# Patient Record
Sex: Female | Born: 1960 | Race: White | Hispanic: No | Smoking: Current every day smoker
Health system: Southern US, Community
[De-identification: ages and names within clinical notes are randomized; demographics above are authoritative.]

## PROBLEM LIST (undated history)

## (undated) DIAGNOSIS — F329 Major depressive disorder, single episode, unspecified: Secondary | ICD-10-CM

## (undated) DIAGNOSIS — Z8614 Personal history of Methicillin resistant Staphylococcus aureus infection: Secondary | ICD-10-CM

## (undated) DIAGNOSIS — M810 Age-related osteoporosis without current pathological fracture: Secondary | ICD-10-CM

## (undated) DIAGNOSIS — M545 Low back pain, unspecified: Secondary | ICD-10-CM

## (undated) DIAGNOSIS — Z91199 Patient's noncompliance with other medical treatment and regimen due to unspecified reason: Secondary | ICD-10-CM

## (undated) DIAGNOSIS — I639 Cerebral infarction, unspecified: Secondary | ICD-10-CM

## (undated) DIAGNOSIS — K219 Gastro-esophageal reflux disease without esophagitis: Secondary | ICD-10-CM

## (undated) DIAGNOSIS — R519 Headache, unspecified: Secondary | ICD-10-CM

## (undated) DIAGNOSIS — F419 Anxiety disorder, unspecified: Secondary | ICD-10-CM

## (undated) DIAGNOSIS — M1990 Unspecified osteoarthritis, unspecified site: Secondary | ICD-10-CM

## (undated) DIAGNOSIS — Z860100 Personal history of colon polyps, unspecified: Secondary | ICD-10-CM

## (undated) DIAGNOSIS — Z8719 Personal history of other diseases of the digestive system: Secondary | ICD-10-CM

## (undated) DIAGNOSIS — N189 Chronic kidney disease, unspecified: Secondary | ICD-10-CM

## (undated) DIAGNOSIS — J42 Unspecified chronic bronchitis: Secondary | ICD-10-CM

## (undated) DIAGNOSIS — J449 Chronic obstructive pulmonary disease, unspecified: Secondary | ICD-10-CM

## (undated) DIAGNOSIS — Z9289 Personal history of other medical treatment: Secondary | ICD-10-CM

## (undated) DIAGNOSIS — F4310 Post-traumatic stress disorder, unspecified: Secondary | ICD-10-CM

## (undated) DIAGNOSIS — F32A Depression, unspecified: Secondary | ICD-10-CM

## (undated) DIAGNOSIS — G2581 Restless legs syndrome: Secondary | ICD-10-CM

## (undated) DIAGNOSIS — G43909 Migraine, unspecified, not intractable, without status migrainosus: Secondary | ICD-10-CM

## (undated) DIAGNOSIS — I10 Essential (primary) hypertension: Secondary | ICD-10-CM

## (undated) DIAGNOSIS — E119 Type 2 diabetes mellitus without complications: Secondary | ICD-10-CM

## (undated) DIAGNOSIS — Z9119 Patient's noncompliance with other medical treatment and regimen: Secondary | ICD-10-CM

## (undated) DIAGNOSIS — R51 Headache: Secondary | ICD-10-CM

## (undated) DIAGNOSIS — K5792 Diverticulitis of intestine, part unspecified, without perforation or abscess without bleeding: Secondary | ICD-10-CM

## (undated) DIAGNOSIS — E785 Hyperlipidemia, unspecified: Secondary | ICD-10-CM

## (undated) DIAGNOSIS — Z8489 Family history of other specified conditions: Secondary | ICD-10-CM

## (undated) DIAGNOSIS — Z8711 Personal history of peptic ulcer disease: Secondary | ICD-10-CM

## (undated) DIAGNOSIS — G35 Multiple sclerosis: Secondary | ICD-10-CM

## (undated) DIAGNOSIS — R918 Other nonspecific abnormal finding of lung field: Secondary | ICD-10-CM

## (undated) DIAGNOSIS — R011 Cardiac murmur, unspecified: Secondary | ICD-10-CM

## (undated) DIAGNOSIS — G629 Polyneuropathy, unspecified: Secondary | ICD-10-CM

## (undated) DIAGNOSIS — G8929 Other chronic pain: Secondary | ICD-10-CM

## (undated) DIAGNOSIS — T7840XA Allergy, unspecified, initial encounter: Secondary | ICD-10-CM

## (undated) DIAGNOSIS — F40240 Claustrophobia: Secondary | ICD-10-CM

## (undated) DIAGNOSIS — D509 Iron deficiency anemia, unspecified: Secondary | ICD-10-CM

## (undated) DIAGNOSIS — D649 Anemia, unspecified: Secondary | ICD-10-CM

## (undated) DIAGNOSIS — J45909 Unspecified asthma, uncomplicated: Secondary | ICD-10-CM

## (undated) DIAGNOSIS — G4700 Insomnia, unspecified: Secondary | ICD-10-CM

## (undated) HISTORY — PX: COLONOSCOPY: SHX174

## (undated) HISTORY — PX: BREAST LUMPECTOMY: SHX2

## (undated) HISTORY — DX: Allergy, unspecified, initial encounter: T78.40XA

## (undated) HISTORY — PX: TUBAL LIGATION: SHX77

## (undated) HISTORY — DX: Major depressive disorder, single episode, unspecified: F32.9

## (undated) HISTORY — PX: BREAST EXCISIONAL BIOPSY: SUR124

## (undated) HISTORY — PX: ESOPHAGOGASTRODUODENOSCOPY: SHX1529

## (undated) HISTORY — PX: ANKLE SURGERY: SHX546

## (undated) HISTORY — DX: Depression, unspecified: F32.A

## (undated) HISTORY — DX: Age-related osteoporosis without current pathological fracture: M81.0

## (undated) HISTORY — PX: DILATION AND CURETTAGE OF UTERUS: SHX78

---
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
## 1987-06-08 HISTORY — PX: APPENDECTOMY: SHX54

## 1987-12-06 HISTORY — PX: ABDOMINAL HYSTERECTOMY: SHX81

## 1990-06-07 HISTORY — PX: TONSILLECTOMY: SUR1361

## 1994-06-07 HISTORY — PX: LAPAROSCOPIC CHOLECYSTECTOMY: SUR755

## 1996-06-07 HISTORY — PX: TUMOR EXCISION: SHX421

## 2003-06-08 HISTORY — PX: POSTERIOR LUMBAR FUSION: SHX6036

## 2011-11-22 ENCOUNTER — Observation Stay (HOSPITAL_COMMUNITY)
Admission: EM | Admit: 2011-11-22 | Discharge: 2011-11-24 | Disposition: A | Discharge status: Home / Self Care | Payer: Medicare Other | Attending: Internal Medicine | Admitting: Internal Medicine

## 2011-11-22 ENCOUNTER — Emergency Department (HOSPITAL_COMMUNITY): Payer: Medicare Other

## 2011-11-22 ENCOUNTER — Encounter (HOSPITAL_COMMUNITY): Payer: Self-pay | Admitting: *Deleted

## 2011-11-22 DIAGNOSIS — I10 Essential (primary) hypertension: Secondary | ICD-10-CM | POA: Diagnosis present

## 2011-11-22 DIAGNOSIS — Z8249 Family history of ischemic heart disease and other diseases of the circulatory system: Secondary | ICD-10-CM | POA: Insufficient documentation

## 2011-11-22 DIAGNOSIS — I252 Old myocardial infarction: Secondary | ICD-10-CM | POA: Insufficient documentation

## 2011-11-22 DIAGNOSIS — J4489 Other specified chronic obstructive pulmonary disease: Secondary | ICD-10-CM | POA: Insufficient documentation

## 2011-11-22 DIAGNOSIS — J449 Chronic obstructive pulmonary disease, unspecified: Secondary | ICD-10-CM | POA: Diagnosis present

## 2011-11-22 DIAGNOSIS — F17200 Nicotine dependence, unspecified, uncomplicated: Secondary | ICD-10-CM | POA: Insufficient documentation

## 2011-11-22 DIAGNOSIS — Z79899 Other long term (current) drug therapy: Secondary | ICD-10-CM | POA: Insufficient documentation

## 2011-11-22 DIAGNOSIS — E785 Hyperlipidemia, unspecified: Secondary | ICD-10-CM | POA: Insufficient documentation

## 2011-11-22 DIAGNOSIS — E119 Type 2 diabetes mellitus without complications: Secondary | ICD-10-CM | POA: Insufficient documentation

## 2011-11-22 DIAGNOSIS — I2510 Atherosclerotic heart disease of native coronary artery without angina pectoris: Secondary | ICD-10-CM | POA: Insufficient documentation

## 2011-11-22 DIAGNOSIS — R0789 Other chest pain: Principal | ICD-10-CM | POA: Insufficient documentation

## 2011-11-22 DIAGNOSIS — K219 Gastro-esophageal reflux disease without esophagitis: Secondary | ICD-10-CM | POA: Diagnosis present

## 2011-11-22 DIAGNOSIS — R079 Chest pain, unspecified: Secondary | ICD-10-CM

## 2011-11-22 HISTORY — DX: Other nonspecific abnormal finding of lung field: R91.8

## 2011-11-22 HISTORY — DX: Essential (primary) hypertension: I10

## 2011-11-22 HISTORY — DX: Hyperlipidemia, unspecified: E78.5

## 2011-11-22 HISTORY — DX: Chronic obstructive pulmonary disease, unspecified: J44.9

## 2011-11-22 LAB — CBC
MCV: 89 fL (ref 78.0–100.0)
Platelets: 273 10*3/uL (ref 150–400)
RBC: 4.62 MIL/uL (ref 3.87–5.11)
WBC: 7.7 10*3/uL (ref 4.0–10.5)

## 2011-11-22 LAB — POCT I-STAT TROPONIN I: Troponin i, poc: 0 ng/mL (ref 0.00–0.08)

## 2011-11-22 LAB — BASIC METABOLIC PANEL
CO2: 24 mEq/L (ref 19–32)
Chloride: 105 mEq/L (ref 96–112)
Creatinine, Ser: 0.8 mg/dL (ref 0.50–1.10)
Potassium: 3.6 mEq/L (ref 3.5–5.1)

## 2011-11-22 MED ORDER — NITROGLYCERIN 0.4 MG SL SUBL
0.4000 mg | SUBLINGUAL_TABLET | SUBLINGUAL | Status: DC | PRN
  Administered 2011-11-22: 0.4 mg via SUBLINGUAL

## 2011-11-22 MED ORDER — MORPHINE SULFATE 4 MG/ML IJ SOLN
4.0000 mg | Freq: Once | INTRAMUSCULAR | Status: AC
  Administered 2011-11-22: 4 mg via INTRAVENOUS
  Filled 2011-11-22: qty 1

## 2011-11-22 MED ORDER — ASPIRIN 81 MG PO CHEW
324.0000 mg | CHEWABLE_TABLET | Freq: Once | ORAL | Status: AC
  Administered 2011-11-22: 324 mg via ORAL
  Filled 2011-11-22: qty 4

## 2011-11-22 MED ORDER — ONDANSETRON HCL 4 MG/2ML IJ SOLN
4.0000 mg | Freq: Once | INTRAMUSCULAR | Status: AC
  Administered 2011-11-22: 4 mg via INTRAVENOUS
  Filled 2011-11-22: qty 2

## 2011-11-22 NOTE — ED Notes (Addendum)
Pt reports intermittent left chest pain radiating to left arm and neck - pt states pain started x3 days ago - admits to increased pain w/ inspiration. Denies any n/v. Skin warm and dry at present. Pt w/ hx of angina, MI and family hx of early MI in bother parents.

## 2011-11-22 NOTE — ED Provider Notes (Signed)
This 51 year old female has a strong family history of coronary artery disease or premature death, she herself has a history of myocardial infarction, her last catheterization was about 4 years ago showing moderate blockages according to the patient, she also has COPD, diabetes, hypertension, and still smokes cigarettes, she has been noncompliant with medications for the last couple years having been unable to find a primary care doctor when she lived in [place], she describes a few days of exertional anginal type symptoms associated with some shortness of breath and chest pressure lasting several minutes each and is pain-free now. She has no pleuritic pain or cough. She is not short of breath at rest. She's had no prolonged episodes of chest discomfort. Her initial ECG and troponin are unremarkable.  Medical screening examination/treatment/procedure(s) were conducted as a shared visit with non-physician practitioner(s) and myself.  I personally evaluated the patient during the encounter  Hurman Horn, MD 11/24/11 0021

## 2011-11-22 NOTE — ED Provider Notes (Signed)
History     CSN: [id number]  Arrival date & time 11/22/11  1945   First MD Initiated Contact with Patient 11/22/11 2139      Chief Complaint  Patient presents with  . Chest Pain    (Consider location/radiation/quality/duration/timing/severity/associated sxs/prior treatment) HPI  Patient has just recently moved to [facility] from [place] and does not have a PCP or cardiologist in the area. She states that she had an MI 6 years ago and a cardiac cath done 3 years ago in which blockage was shown in multiple vessels she reports up to 50%. She admits that she has not been taking medications to control her blood pressure. She states that her pain started 3 days ago and has been intermittent and sharp. It has been exacerbated my effort and relieves with rest. She currently is having intermittent chest pains that are sharp and substernal.   Past Medical History  Diagnosis Date  . Diabetes mellitus   . Hypertension   . Angina pectoris   . Hyperlipidemia   . COPD (chronic obstructive pulmonary disease)   . Reflux   . Myocardial infarction     No past surgical history on file.  No family history on file.  History  Substance Use Topics  . Smoking status: Current Everyday Smoker -- 0.5 packs/day  . Smokeless tobacco: Not on file  . Alcohol Use: No    OB History    Grav Para Term Preterm Abortions TAB SAB Ect Mult Living                  Review of Systems   HEENT: denies blurry vision or change in hearing PULMONARY: Denies difficulty breathing and SOB CARDIAC: denies heart palpitations MUSCULOSKELETAL:  denies being unable to ambulate ABDOMEN AL: denies abdominal pain GU: denies loss of bowel or urinary control NEURO: denies numbness and tingling in extremities SKIN: no new rashes PSYCH: patient behavior is normal NECK: Not complaining of neck pain     Allergies  Penicillins  Home Medications   Current Outpatient Rx  Name Route Sig Dispense Refill  . CARVEDILOL  6.25 MG PO TABS Oral Take 6.25 mg by mouth 2 (two) times daily with a meal.    . GABAPENTIN 400 MG PO CAPS Oral Take 400 mg by mouth 3 (three) times daily.    Marland Kitchen HYDROCHLOROTHIAZIDE 12.5 MG PO CAPS Oral Take 12.5 mg by mouth daily.    Marland Kitchen LISINOPRIL 40 MG PO TABS Oral Take 40 mg by mouth daily.    Marland Kitchen OMEPRAZOLE 20 MG PO CPDR Oral Take 20 mg by mouth daily.    Marland Kitchen ROPINIROLE HCL 2 MG PO TABS Oral Take 2 mg by mouth at bedtime.    Marland Kitchen ZOLPIDEM TARTRATE 10 MG PO TABS Oral Take 10 mg by mouth at bedtime as needed.      BP 172/83  Pulse 59  Temp 98.8 F (37.1 C) (Oral)  Resp 21  Ht 5\' 7"  (1.702 m)  Wt 182 lb (82.555 kg)  BMI 28.51 kg/m2  SpO2 100%  Physical Exam  Nursing note and vitals reviewed. Constitutional: She appears well-developed and well-nourished. No distress.  HENT:  Head: Normocephalic and atraumatic.  Eyes: Pupils are equal, round, and reactive to light.  Neck: Normal range of motion. Neck supple.  Cardiovascular: Normal rate and regular rhythm.   Pulmonary/Chest: Effort normal.  Abdominal: Soft.  Neurological: She is alert.  Skin: Skin is warm and dry.    ED Course  Procedures (  including critical care time)  Labs Reviewed  BASIC METABOLIC PANEL - Abnormal; Notable for the following:    GFR calc non Af Amer 85 (*)     All other components within normal limits  CBC  POCT I-STAT TROPONIN I   Dg Chest 2 View  11/22/2011  *RADIOLOGY REPORT*  Clinical Data: Left-sided chest pain.  Shortness of breath.  Cough. Asthma.  COPD.  CHEST - 2 VIEW  Comparison:  None.  Findings:  The heart size and mediastinal contours are within normal limits.  Both lungs are clear.  The visualized skeletal structures are unremarkable.  IMPRESSION: No active cardiopulmonary disease.  Original Report Authenticated By: Danae Orleans, M.D.     1. Chest pain       MDM  EKG and first Trop both normal   Date: 11/22/2011  Rate: 82  Rhythm: normal sinus rhythm  QRS Axis: normal  Intervals:  normal  ST/T Wave abnormalities: normal  Conduction Disutrbances:none  Narrative Interpretation:   Old EKG Reviewed: none available  Patients pain was momentarily relieved with Nitro. It, however did start to come back. Pt was given morphine and she has been symptom free since then. Dr. Fonnie Jarvis has evaluated patient and agrees with my plan for cardiac rule out.  [facility]- Dr. Cena Benton, Team 5, Telemetry       Zaydan Papesh Irine Seal, [place] 11/22/11 2330  Dorthula Matas, PA 11/22/11 [phone]

## 2011-11-23 ENCOUNTER — Encounter (HOSPITAL_COMMUNITY): Payer: Self-pay | Admitting: *Deleted

## 2011-11-23 DIAGNOSIS — I10 Essential (primary) hypertension: Secondary | ICD-10-CM | POA: Diagnosis present

## 2011-11-23 DIAGNOSIS — J438 Other emphysema: Secondary | ICD-10-CM

## 2011-11-23 DIAGNOSIS — R0789 Other chest pain: Secondary | ICD-10-CM | POA: Diagnosis present

## 2011-11-23 DIAGNOSIS — I517 Cardiomegaly: Secondary | ICD-10-CM

## 2011-11-23 DIAGNOSIS — K219 Gastro-esophageal reflux disease without esophagitis: Secondary | ICD-10-CM

## 2011-11-23 DIAGNOSIS — J449 Chronic obstructive pulmonary disease, unspecified: Secondary | ICD-10-CM | POA: Diagnosis present

## 2011-11-23 LAB — HEMOGLOBIN A1C: Mean Plasma Glucose: 117 mg/dL — ABNORMAL HIGH (ref <117)

## 2011-11-23 LAB — CBC
MCH: 30.3 pg (ref 26.0–34.0)
MCHC: 34 g/dL (ref 30.0–36.0)
MCV: 89.2 fL (ref 78.0–100.0)
Platelets: 239 10*3/uL (ref 150–400)
RBC: 4.65 MIL/uL (ref 3.87–5.11)

## 2011-11-23 LAB — CREATININE, SERUM
Creatinine, Ser: 0.76 mg/dL (ref 0.50–1.10)
GFR calc Af Amer: >90 mL/min
GFR calc non Af Amer: >90 mL/min

## 2011-11-23 LAB — LIPID PANEL
Cholesterol: 160 mg/dL (ref 0–200)
HDL: 34 mg/dL — ABNORMAL LOW (ref >39)
Total CHOL/HDL Ratio: 4.7 RATIO
Triglycerides: 139 mg/dL (ref <150)

## 2011-11-23 LAB — GLUCOSE, CAPILLARY: Glucose-Capillary: 112 mg/dL — ABNORMAL HIGH (ref 70–99)

## 2011-11-23 LAB — BASIC METABOLIC PANEL WITH GFR
BUN: 17 mg/dL (ref 6–23)
CO2: 20 meq/L (ref 19–32)
Calcium: 9.1 mg/dL (ref 8.4–10.5)
Chloride: 103 meq/L (ref 96–112)
Creatinine, Ser: 0.77 mg/dL (ref 0.50–1.10)
GFR calc Af Amer: >90 mL/min
GFR calc non Af Amer: >90 mL/min
Glucose, Bld: 104 mg/dL — ABNORMAL HIGH (ref 70–99)
Potassium: 3.7 meq/L (ref 3.5–5.1)
Sodium: 138 meq/L (ref 135–145)

## 2011-11-23 LAB — CARDIAC PANEL(CRET KIN+CKTOT+MB+TROPI)
CK, MB: 1.6 ng/mL (ref 0.3–4.0)
CK, MB: 1.8 ng/mL (ref 0.3–4.0)
Relative Index: INVALID (ref 0.0–2.5)
Relative Index: INVALID (ref 0.0–2.5)
Total CK: 50 U/L (ref 7–177)
Total CK: 47 U/L (ref 7–177)
Troponin I: <0.3 ng/mL
Troponin I: <0.3 ng/mL

## 2011-11-23 LAB — MAGNESIUM: Magnesium: 2.1 mg/dL (ref 1.5–2.5)

## 2011-11-23 LAB — PROTIME-INR: Prothrombin Time: 14.1 seconds (ref 11.6–15.2)

## 2011-11-23 LAB — TSH: TSH: 4.028 u[IU]/mL (ref 0.350–4.500)

## 2011-11-23 MED ORDER — LISINOPRIL 40 MG PO TABS
40.0000 mg | ORAL_TABLET | Freq: Every day | ORAL | Status: DC
  Administered 2011-11-23: 40 mg via ORAL
  Filled 2011-11-23 (×2): qty 1

## 2011-11-23 MED ORDER — HEPARIN SODIUM (PORCINE) 5000 UNIT/ML IJ SOLN
5000.0000 [IU] | Freq: Three times a day (TID) | INTRAMUSCULAR | Status: DC
  Administered 2011-11-23 – 2011-11-24 (×5): 5000 [IU] via SUBCUTANEOUS
  Filled 2011-11-23 (×8): qty 1

## 2011-11-23 MED ORDER — SODIUM CHLORIDE 0.9 % IV SOLN: 250.0000 mL | INTRAVENOUS | Status: DC | PRN

## 2011-11-23 MED ORDER — ONDANSETRON HCL 4 MG/2ML IJ SOLN: 4.0000 mg | Freq: Four times a day (QID) | INTRAMUSCULAR | Status: DC | PRN

## 2011-11-23 MED ORDER — SODIUM CHLORIDE 0.9 % IJ SOLN: 3.0000 mL | INTRAMUSCULAR | Status: DC | PRN

## 2011-11-23 MED ORDER — CARVEDILOL 6.25 MG PO TABS
6.2500 mg | ORAL_TABLET | Freq: Two times a day (BID) | ORAL | Status: DC
  Administered 2011-11-23 – 2011-11-24 (×3): 6.25 mg via ORAL
  Filled 2011-11-23 (×5): qty 1

## 2011-11-23 MED ORDER — PANTOPRAZOLE SODIUM 40 MG IV SOLR
40.0000 mg | INTRAVENOUS | Status: DC
  Administered 2011-11-23 – 2011-11-24 (×2): 40 mg via INTRAVENOUS
  Filled 2011-11-23 (×3): qty 40

## 2011-11-23 MED ORDER — NITROGLYCERIN 0.4 MG SL SUBL: 0.4000 mg | SUBLINGUAL_TABLET | SUBLINGUAL | Status: DC | PRN

## 2011-11-23 MED ORDER — ASPIRIN EC 81 MG PO TBEC
81.0000 mg | DELAYED_RELEASE_TABLET | Freq: Every day | ORAL | Status: DC
  Filled 2011-11-23: qty 1

## 2011-11-23 MED ORDER — HYDROCODONE-ACETAMINOPHEN 5-325 MG PO TABS
1.0000 | ORAL_TABLET | ORAL | Status: DC | PRN
  Administered 2011-11-23: 1 via ORAL
  Filled 2011-11-23: qty 1

## 2011-11-23 MED ORDER — SODIUM CHLORIDE 0.9 % IJ SOLN
3.0000 mL | Freq: Two times a day (BID) | INTRAMUSCULAR | Status: DC
  Administered 2011-11-23 – 2011-11-24 (×2): 3 mL via INTRAVENOUS

## 2011-11-23 MED ORDER — ZOLPIDEM TARTRATE 10 MG PO TABS
10.0000 mg | ORAL_TABLET | Freq: Every evening | ORAL | Status: DC | PRN
  Administered 2011-11-23: 10 mg via ORAL
  Filled 2011-11-23: qty 1

## 2011-11-23 MED ORDER — NICOTINE 14 MG/24HR TD PT24
14.0000 mg | MEDICATED_PATCH | Freq: Every day | TRANSDERMAL | Status: DC
  Administered 2011-11-23 – 2011-11-24 (×2): 14 mg via TRANSDERMAL
  Filled 2011-11-23 (×2): qty 1

## 2011-11-23 MED ORDER — ATORVASTATIN CALCIUM 20 MG PO TABS
20.0000 mg | ORAL_TABLET | Freq: Every day | ORAL | Status: DC
  Administered 2011-11-23 – 2011-11-24 (×2): 20 mg via ORAL
  Filled 2011-11-23 (×2): qty 1

## 2011-11-23 MED ORDER — INSULIN ASPART 100 UNIT/ML ~~LOC~~ SOLN
0.0000 [IU] | Freq: Three times a day (TID) | SUBCUTANEOUS | Status: DC
  Administered 2011-11-24: 1 [IU] via SUBCUTANEOUS

## 2011-11-23 MED ORDER — ALBUTEROL SULFATE (5 MG/ML) 0.5% IN NEBU: 2.5000 mg | INHALATION_SOLUTION | Freq: Four times a day (QID) | RESPIRATORY_TRACT | Status: DC | PRN

## 2011-11-23 MED ORDER — ACETAMINOPHEN 325 MG PO TABS
650.0000 mg | ORAL_TABLET | ORAL | Status: DC | PRN
  Administered 2011-11-23: 650 mg via ORAL
  Filled 2011-11-23: qty 2

## 2011-11-23 MED ORDER — ROPINIROLE HCL 1 MG PO TABS
2.0000 mg | ORAL_TABLET | Freq: Every day | ORAL | Status: DC
  Administered 2011-11-23: 2 mg via ORAL
  Filled 2011-11-23 (×2): qty 2

## 2011-11-23 MED ORDER — HYDROCHLOROTHIAZIDE 12.5 MG PO CAPS
12.5000 mg | ORAL_CAPSULE | Freq: Every day | ORAL | Status: DC
  Administered 2011-11-23: 12.5 mg via ORAL
  Filled 2011-11-23 (×2): qty 1

## 2011-11-23 MED ORDER — GABAPENTIN 400 MG PO CAPS
400.0000 mg | ORAL_CAPSULE | Freq: Three times a day (TID) | ORAL | Status: DC
  Administered 2011-11-23 – 2011-11-24 (×4): 400 mg via ORAL
  Filled 2011-11-23 (×6): qty 1

## 2011-11-23 NOTE — Progress Notes (Signed)
[facility] PROGRESS NOTE  Katrina [name] NWG:[id number] DOB: 17-Aug-1960 DOA: 11/22/2011 PCP: No primary provider on file.  Assessment/Plan: Chest discomfort   ACS  Ruled out. - Cycle cardiac enzymes negative. EKG shows no ischemic changes. Tele monitor unremarkable.  Lipids 96 of LDL. - Echocardiogram pending - continue aspirin  Cardiology consult called for possible stress testing.  - Seems atypical but given history of diabetes and possibility of atypical presentation will plan on ruling out.  Active Problems:  COPD (chronic obstructive pulmonary disease)  - Stable currently will add albuterol prn  DM  - hemoglobin A1c is 5.7 - Place on diabetic diet  GERD (gastroesophageal reflux disease)  - Will add protonix  HTN (hypertension)  - Will plan on continuing home regimen  Code Status: full code   Katrina Towry, MD  [facility] Pager [phone]  If 7PM-7AM, please contact night-coverage [email or web address] Password [email or web address] 11/23/2011, 2:01 PM   LOS: 1 day   Brief narrative: Patient is a 51 y/o CF with PMH of DM (currently off medication), long standing htn, copd, and reflux. Is presenting to the ED complaining of chest discomfort for the past 3 days   Consultants:  CARDIOLOGY  Procedures:  ECHO PENDING.  Antibiotics:  NONE  HPI/Subjective: INTERMITTENT CHEST PAIN.  Objective: Filed Vitals:   11/22/11 2300 11/23/11 0256 11/23/11 0430 11/23/11 0800  BP: 172/83 161/73 163/93 163/93  Pulse: 59 76 61 61  Temp:  98.7 F (37.1 C) 97.6 F (36.4 C) 97.6 F (36.4 C)  TempSrc:  Oral Oral Oral  Resp: 21 18 18 18   Height:   5\' 7"  (1.702 m)   Weight:   85.004 kg (187 lb 6.4 oz)   SpO2: 100% 100% 99% 100%    Intake/Output Summary (Last 24 hours) at 11/23/11 1401 Last data filed at 11/23/11 0500  Gross per 24 hour  Intake    240 ml  Output      0 ml  Net    240 ml    Exam:   General:  Alert afebrile comfortable  Cardiovascular: s1s2 heard  RRR  Respiratory: CTAB no wheezing or rhonchi  Abdomen: soft NT ND BS+  Data Reviewed: Basic Metabolic Panel:  Lab 11/23/11 2952 11/22/11 2023  NA 138 138  K 3.7 3.6  CL 103 105  CO2 20 24  GLUCOSE 104* 89  BUN 17 16  CREATININE 0.760.77 0.80  CALCIUM 9.1 9.0  MG 2.1 --  PHOS -- --   Liver Function Tests: No results found for this basename: AST:5,ALT:5,ALKPHOS:5,BILITOT:5,PROT:5,ALBUMIN:5 in the last 168 hours No results found for this basename: LIPASE:5,AMYLASE:5 in the last 168 hours No results found for this basename: AMMONIA:5 in the last 168 hours CBC:  Lab 11/23/11 0235 11/22/11 2023  WBC 9.8 7.7  NEUTROABS -- --  HGB 14.1 14.0  HCT 41.5 41.1  MCV 89.2 89.0  PLT 239 273   Cardiac Enzymes:  Lab 11/23/11 0810 11/23/11 0236  CKTOTAL 50 49  CKMB 1.6 1.4  CKMBINDEX -- --  TROPONINI <0.30 <0.30   BNP (last 3 results) No results found for this basename: PROBNP:3 in the last 8760 hours CBG: No results found for this basename: GLUCAP:5 in the last 168 hours  No results found for this or any previous visit (from the past 240 hour(s)).   Studies: Dg Chest 2 View  11/22/2011  *RADIOLOGY REPORT*  Clinical Data: Left-sided chest pain.  Shortness of breath.  Cough. Asthma.  COPD.  CHEST - 2 VIEW  Comparison:  None.  Findings:  The heart size and mediastinal contours are within normal limits.  Both lungs are clear.  The visualized skeletal structures are unremarkable.  IMPRESSION: No active cardiopulmonary disease.  Original Report Authenticated By: Danae Orleans, M.D.    Scheduled Meds:   . aspirin  324 mg Oral Once  . aspirin EC  81 mg Oral Daily  . atorvastatin  20 mg Oral q1800  . carvedilol  6.25 mg Oral BID WC  . gabapentin  400 mg Oral TID  . heparin  5,000 Units Subcutaneous Q8H  . hydrochlorothiazide  12.5 mg Oral Daily  . lisinopril  40 mg Oral Daily  .  morphine injection  4 mg Intravenous Once  . nicotine  14 mg Transdermal Daily  . ondansetron  4  mg Intravenous Once  . pantoprazole (PROTONIX) IV  40 mg Intravenous Q24H  . rOPINIRole  2 mg Oral QHS  . sodium chloride  3 mL Intravenous Q12H   Continuous Infusions:

## 2011-11-23 NOTE — Consult Note (Signed)
HPI: 51 year old female for evaluation of chest pain. Patient states she had a myocardial infarction in [place] 6 years ago. She apparently had 2 cardiac catheterizations but those records are not available. She apparently had nonobstructive disease and did not require intervention. She resided in [place] for a while and recently moved here 2 weeks ago. She presented with complaints of chest pain and has ruled out for myocardial infarction. Cardiology was therefore asked to evaluate. The patient apparently has had intermittent chest pain for 6-7 years. Over the past 3 days it has worsened. She has had continuous pain in the left upper chest and axilla. It is described as a sharp pain followed by a pressure. There is associated diaphoresis, shortness of breath and nausea. It increases with certain movements, lying flat, inspiration and exertion. Cardiology is therefore asked to evaluate.  Medications Prior to Admission  Medication Sig Dispense Refill  . carvedilol (COREG) 6.25 MG tablet Take 6.25 mg by mouth 2 (two) times daily with a meal.      . gabapentin (NEURONTIN) 400 MG capsule Take 400 mg by mouth 3 (three) times daily.      . hydrochlorothiazide (MICROZIDE) 12.5 MG capsule Take 12.5 mg by mouth daily.      Marland Kitchen lisinopril (PRINIVIL,ZESTRIL) 40 MG tablet Take 40 mg by mouth daily.      Marland Kitchen omeprazole (PRILOSEC) 20 MG capsule Take 20 mg by mouth daily.      Marland Kitchen rOPINIRole (REQUIP) 2 MG tablet Take 2 mg by mouth at bedtime.      Marland Kitchen zolpidem (AMBIEN) 10 MG tablet Take 10 mg by mouth at bedtime as needed.        Allergies  Allergen Reactions  . Penicillins Anaphylaxis    Past Medical History  Diagnosis Date  . Diabetes mellitus   . Hypertension   . Hyperlipidemia   . COPD (chronic obstructive pulmonary disease)   . Reflux   . Myocardial infarction   . CAD (coronary artery disease)   . Hypothyroid   . Lung nodules   . Diverticulosis   . Bipolar affective     Past Surgical  History  Procedure Date  . Back surgery   . Abdominal hysterectomy   . Appendectomy   . Tonsillectomy   . Ankle surgery   . Cholecystectomy   . Nose surgery     History   Social History  . Marital Status: Single    Spouse Name: N/A    Number of Children: N/A  . Years of Education: N/A   Occupational History  .      Disability for back pain   Social History Main Topics  . Smoking status: Current Everyday Smoker -- 0.5 packs/day  . Smokeless tobacco: Not on file  . Alcohol Use: No  . Drug Use: No  . Sexually Active:    Other Topics Concern  . Not on file   Social History Narrative  . No narrative on file    Family History  Problem Relation Age of Onset  . Coronary artery disease Father     ROS:  Chronic back pain but no fevers or chills, productive cough, hemoptysis, dysphasia, odynophagia, melena, hematochezia, dysuria, hematuria, rash, seizure activity, orthopnea, PND, pedal edema, claudication. Remaining systems are negative.  Physical Exam:   Blood pressure 130/89, pulse 70, temperature 98.2 F (36.8 C), temperature source Oral, resp. rate 20, height 5\' 7"  (1.702 m), weight 85.004 kg (187 lb 6.4 oz), SpO2 97.00%.  General:  Well developed/well nourished in NAD Skin warm/dry Patient not depressed No peripheral clubbing Back-normal HEENT-normal/normal eyelids Neck supple/normal carotid upstroke bilaterally; no bruits; no JVD; no thyromegaly chest - CTA/ normal expansion CV - RRR/normal S1 and S2; no murmurs, rubs or gallops;  PMI nondisplaced Abdomen -NT/ND, no HSM, no mass, + bowel sounds, no bruit 2+ femoral pulses, no bruits Ext-no edema, chords, 2+ DP Neuro-grossly nonfocal  ECG normal sinus rhythm with no ST changes.  Results for orders placed during the hospital encounter of 11/22/11 (from the past 48 hour(s))  CBC     Status: Normal   Collection Time   11/22/11  8:23 PM      Component Value Range Comment   WBC 7.7  4.0 - 10.5 K/uL    RBC  4.62  3.87 - 5.11 MIL/uL    Hemoglobin 14.0  12.0 - 15.0 g/dL    HCT 16.1  09.6 - 04.5 %    MCV 89.0  78.0 - 100.0 fL    MCH 30.3  26.0 - 34.0 pg    MCHC 34.1  30.0 - 36.0 g/dL    RDW 40.9  81.1 - 91.4 %    Platelets 273  150 - 400 K/uL   BASIC METABOLIC PANEL     Status: Abnormal   Collection Time   11/22/11  8:23 PM      Component Value Range Comment   Sodium 138  135 - 145 mEq/L    Potassium 3.6  3.5 - 5.1 mEq/L    Chloride 105  96 - 112 mEq/L    CO2 24  19 - 32 mEq/L    Glucose, Bld 89  70 - 99 mg/dL    BUN 16  6 - 23 mg/dL    Creatinine, Ser 7.82  0.50 - 1.10 mg/dL    Calcium 9.0  8.4 - 95.6 mg/dL    GFR calc non Af Amer 85 (*) >90 mL/min    GFR calc Af Amer >90  >90 mL/min   POCT I-STAT TROPONIN I     Status: Normal   Collection Time   11/22/11  8:32 PM      Component Value Range Comment   Troponin i, poc 0.00  0.00 - 0.08 ng/mL    Comment 3            PROTIME-INR     Status: Normal   Collection Time   11/23/11  2:35 AM      Component Value Range Comment   Prothrombin Time 14.1  11.6 - 15.2 seconds    INR 1.07  0.00 - 1.49   TSH     Status: Normal   Collection Time   11/23/11  2:35 AM      Component Value Range Comment   TSH 4.028  0.350 - 4.500 uIU/mL   MAGNESIUM     Status: Normal   Collection Time   11/23/11  2:35 AM      Component Value Range Comment   Magnesium 2.1  1.5 - 2.5 mg/dL   LIPID PANEL     Status: Abnormal   Collection Time   11/23/11  2:35 AM      Component Value Range Comment   Cholesterol 160  0 - 200 mg/dL    Triglycerides 213  <086 mg/dL    HDL 34 (*) >57 mg/dL    Total CHOL/HDL Ratio 4.7      VLDL 28  0 - 40 mg/dL    LDL Cholesterol  98  0 - 99 mg/dL   HEMOGLOBIN J4N     Status: Abnormal   Collection Time   11/23/11  2:35 AM      Component Value Range Comment   Hemoglobin A1C 5.7 (*) <5.7 %    Mean Plasma Glucose 117 (*) <117 mg/dL   CBC     Status: Normal   Collection Time   11/23/11  2:35 AM      Component Value Range Comment   WBC  9.8  4.0 - 10.5 K/uL    RBC 4.65  3.87 - 5.11 MIL/uL    Hemoglobin 14.1  12.0 - 15.0 g/dL    HCT 82.9  56.2 - 13.0 %    MCV 89.2  78.0 - 100.0 fL    MCH 30.3  26.0 - 34.0 pg    MCHC 34.0  30.0 - 36.0 g/dL    RDW 86.5  78.4 - 69.6 %    Platelets 239  150 - 400 K/uL   CREATININE, SERUM     Status: Normal   Collection Time   11/23/11  2:35 AM      Component Value Range Comment   Creatinine, Ser 0.76  0.50 - 1.10 mg/dL    GFR calc non Af Amer >90  >90 mL/min    GFR calc Af Amer >90  >90 mL/min   BASIC METABOLIC PANEL     Status: Abnormal   Collection Time   11/23/11  2:35 AM      Component Value Range Comment   Sodium 138  135 - 145 mEq/L    Potassium 3.7  3.5 - 5.1 mEq/L    Chloride 103  96 - 112 mEq/L    CO2 20  19 - 32 mEq/L    Glucose, Bld 104 (*) 70 - 99 mg/dL    BUN 17  6 - 23 mg/dL    Creatinine, Ser 2.95  0.50 - 1.10 mg/dL    Calcium 9.1  8.4 - 28.4 mg/dL    GFR calc non Af Amer >90  >90 mL/min    GFR calc Af Amer >90  >90 mL/min   CARDIAC PANEL(CRET KIN+CKTOT+MB+TROPI)     Status: Normal   Collection Time   11/23/11  2:36 AM      Component Value Range Comment   Total CK 49  7 - 177 U/L    CK, MB 1.4  0.3 - 4.0 ng/mL    Troponin I <0.30  <0.30 ng/mL    Relative Index RELATIVE INDEX IS INVALID  0.0 - 2.5   CARDIAC PANEL(CRET KIN+CKTOT+MB+TROPI)     Status: Normal   Collection Time   11/23/11  8:10 AM      Component Value Range Comment   Total CK 50  7 - 177 U/L    CK, MB 1.6  0.3 - 4.0 ng/mL    Troponin I <0.30  <0.30 ng/mL    Relative Index RELATIVE INDEX IS INVALID  0.0 - 2.5   CARDIAC PANEL(CRET KIN+CKTOT+MB+TROPI)     Status: Normal   Collection Time   11/23/11  1:48 PM      Component Value Range Comment   Total CK 47  7 - 177 U/L    CK, MB 1.8  0.3 - 4.0 ng/mL    Troponin I <0.30  <0.30 ng/mL    Relative Index RELATIVE INDEX IS INVALID  0.0 - 2.5     Dg Chest 2 View  11/22/2011  *RADIOLOGY REPORT*  Clinical Data: Left-sided chest pain.  Shortness of breath.   Cough. Asthma.  COPD.  CHEST - 2 VIEW  Comparison:  None.  Findings:  The heart size and mediastinal contours are within normal limits.  Both lungs are clear.  The visualized skeletal structures are unremarkable.  IMPRESSION: No active cardiopulmonary disease.  Original Report Authenticated By: Danae Orleans, M.D.    Assessment/Plan #1-chest pain-symptoms are extremely atypical. They have been continuous for the past 3 days. Doubt cardiac etiology. Enzymes are negative. Will arrange Myoview for risk stratification. If negative no plans for further cardiac evaluation. Would obtain records concerning previous catheterization. #2-CAD-patient describes nonobstructive disease on 2 previous catheterizations in [place]. Obtain records. Continue aspirin and statin. #3-hypertension-increase medications as needed. #4-diabetes mellitus-management per primary care. #5-history of pulmonary nodules by report-her primary care will need outside records and she will need followup for this issue. #6-tobacco abuse-patient counseled on discontinuing. #7-hyperlipidemia-continue statin. #8-COPD  Note patient has multiple medical problems and close followup with a primary care physician. If she has a normal functional study I do not think she will require cardiology followup. Discussed at length risk factor modification.  Olga Millers MD 11/23/2011, 2:43 PM

## 2011-11-23 NOTE — H&P (Signed)
PCP:  Patient recently moved here from [facility]  Chief Complaint:  Chest discomfort  HPI: Patient is a 51 y/o CF with PMH of DM (currently off medication), long standing htn, copd, and reflux.  Is presenting to the ED complaining of chest discomfort for the past 3 days.  Mentions that the pain is a stabbing sensation at times.  She feels like the pain has gone up into her left shoulder and radiated down her left arm.  Does not know what makes it better or worse.  Denies any recent fevers, chills, productive cough, nausea, emesis, or diaphoresis.  Patient reportedly has not been taking her medication for DM but she does states that she has been taking her Blood pressure medication as well as her medication for reflux of which she takes prilosec and zantac sometimes.    In the ED patient had a chest x ray which showed no cardiopulmonary disease, CBC and BMP which were WNL's, and i stat troponin level of 0.00.  Patient was given aspirin 324 mg po x 1, morphine, zofran, and nitroglycerin which she mentions did not really help her chest discomfort.  Allergies:   Allergies  Allergen Reactions  . Penicillins Anaphylaxis      Past Medical History  Diagnosis Date  . Diabetes mellitus   . Hypertension   . Angina pectoris   . Hyperlipidemia   . COPD (chronic obstructive pulmonary disease)   . Reflux   . Myocardial infarction     No past surgical history on file.  Prior to Admission medications   Medication Sig Start Date End Date Taking? Authorizing Provider  carvedilol (COREG) 6.25 MG tablet Take 6.25 mg by mouth 2 (two) times daily with a meal.   Yes Historical Provider, MD  gabapentin (NEURONTIN) 400 MG capsule Take 400 mg by mouth 3 (three) times daily.   Yes Historical Provider, MD  hydrochlorothiazide (MICROZIDE) 12.5 MG capsule Take 12.5 mg by mouth daily.   Yes Historical Provider, MD  lisinopril (PRINIVIL,ZESTRIL) 40 MG tablet Take 40 mg by mouth daily.   Yes Historical Provider, MD    omeprazole (PRILOSEC) 20 MG capsule Take 20 mg by mouth daily.   Yes Historical Provider, MD  rOPINIRole (REQUIP) 2 MG tablet Take 2 mg by mouth at bedtime.   Yes Historical Provider, MD  zolpidem (AMBIEN) 10 MG tablet Take 10 mg by mouth at bedtime as needed.   Yes Historical Provider, MD    Social History:  reports that she has been smoking.  She does not have any smokeless tobacco history on file. She reports that she does not drink alcohol or use illicit drugs.  No family history on file.  Review of Systems:  Constitutional: Denies fever, chills, diaphoresis, appetite change and fatigue.  HEENT: Denies photophobia, eye pain, redness, hearing loss, ear pain, congestion, sore throat, rhinorrhea, sneezing, mouth sores, trouble swallowing, neck pain, neck stiffness and tinnitus.   Respiratory: Denies SOB, DOE, + cough (nonproductive), denies chest tightness,  and wheezing.   Cardiovascular: +chest pain, denies palpitations and leg swelling.  Gastrointestinal: + nausea, denies vomiting, abdominal pain, diarrhea, constipation, blood in stool and abdominal distention.  Genitourinary: Denies dysuria, urgency, frequency, hematuria, flank pain and difficulty urinating.  Musculoskeletal: Denies myalgias, back pain, joint swelling, arthralgias and gait problem.  Skin: Denies pallor, rash and wound.  Neurological: Denies dizziness, seizures, syncope, weakness, light-headedness, numbness and headaches.  Hematological: Denies adenopathy. Easy bruising, personal or family bleeding history  Psychiatric/Behavioral: Denies suicidal ideation, mood  changes, confusion, nervousness, sleep disturbance and agitation   Physical Exam: Blood pressure 172/83, pulse 59, temperature 98.8 F (37.1 C), temperature source Oral, resp. rate 21, height 5\' 7"  (1.702 m), weight 82.555 kg (182 lb), SpO2 100.00%. General: Alert, awake, oriented x3, in no acute distress. HEENT: Atraumatic normocephalic, EOMI, PERRLA No  bruits, no goiter. Heart: Regular rate and rhythm, without murmurs, rubs, gallops. Some discomfort on palpation over left chest. Lungs: Clear to auscultation bilaterally. No wheezes. Pt speaks in full sentences Abdomen: Soft, nontender, nondistended, positive bowel sounds. Extremities: No clubbing cyanosis or edema with positive pedal pulses. Neuro: Grossly intact, nonfocal.  Labs on Admission:  Results for orders placed during the hospital encounter of 11/22/11 (from the past 48 hour(s))  CBC     Status: Normal   Collection Time   11/22/11  8:23 PM      Component Value Range Comment   WBC 7.7  4.0 - 10.5 K/uL    RBC 4.62  3.87 - 5.11 MIL/uL    Hemoglobin 14.0  12.0 - 15.0 g/dL    HCT 40.9  81.1 - 91.4 %    MCV 89.0  78.0 - 100.0 fL    MCH 30.3  26.0 - 34.0 pg    MCHC 34.1  30.0 - 36.0 g/dL    RDW 78.2  95.6 - 21.3 %    Platelets 273  150 - 400 K/uL   BASIC METABOLIC PANEL     Status: Abnormal   Collection Time   11/22/11  8:23 PM      Component Value Range Comment   Sodium 138  135 - 145 mEq/L    Potassium 3.6  3.5 - 5.1 mEq/L    Chloride 105  96 - 112 mEq/L    CO2 24  19 - 32 mEq/L    Glucose, Bld 89  70 - 99 mg/dL    BUN 16  6 - 23 mg/dL    Creatinine, Ser 0.86  0.50 - 1.10 mg/dL    Calcium 9.0  8.4 - 57.8 mg/dL    GFR calc non Af Amer 85 (*) >90 mL/min    GFR calc Af Amer >90  >90 mL/min   POCT I-STAT TROPONIN I     Status: Normal   Collection Time   11/22/11  8:32 PM      Component Value Range Comment   Troponin i, poc 0.00  0.00 - 0.08 ng/mL    Comment 3              Radiological Exams on Admission: Dg Chest 2 View  11/22/2011  *RADIOLOGY REPORT*  Clinical Data: Left-sided chest pain.  Shortness of breath.  Cough. Asthma.  COPD.  CHEST - 2 VIEW  Comparison:  None.  Findings:  The heart size and mediastinal contours are within normal limits.  Both lungs are clear.  The visualized skeletal structures are unremarkable.  IMPRESSION: No active cardiopulmonary disease.   Original Report Authenticated By: Danae Orleans, M.D.    Assessment/Plan Principal Problem:  *Chest discomfort - r/o ACS - admit to telemetry - Cycle cardiac enzymes - Echocardiogram - continue aspirin - Seems atypical but given history of diabetes and possibility of atypical presentation will plan on ruling out.  Active Problems:  COPD (chronic obstructive pulmonary disease) - Stable currently will add albuterol prn  DM - obtain hemoglobin A1c - blood glucose on BMP WNL's - Place on diabetic diet  GERD (gastroesophageal reflux disease) - Will add protonix  HTN (hypertension) - Will plan on continuing home regimen. Currently not well controlled and may be secondary to discomfort.  Patient mentioned that she was in the past taking medication for hypothyroidism but has not been doing so for years.  Will go ahead and order TSH at this juncture.   Time Spent on Admission: > 55 minutes obtaining history from patient, PE, updating information systems, billing, medical decision making, placing orders.  Penny Pia [facility] Pager: [phone] 11/23/2011, 12:48 AM

## 2011-11-23 NOTE — Progress Notes (Signed)
*  PRELIMINARY RESULTS* Echocardiogram 2D Echocardiogram has been performed.  Katrina [name] 11/23/2011, 4:06 PM

## 2011-11-23 NOTE — ED Notes (Signed)
MD at bedside. 

## 2011-11-23 NOTE — Progress Notes (Signed)
   CARE MANAGEMENT NOTE 11/23/2011  Patient:  Katrina [name], Katrina [name]   Account Number:  [id number]  Date Initiated:  11/23/2011  Documentation initiated by:  Jiles Crocker  Subjective/Objective Assessment:   ADMITTED TO OBSERVATION WITH CHEST PAIN     Action/Plan:   INDEPENDENT PRIOR TO ADMISSION;RECENTLY MOVED HERE FROM [place]   Anticipated DC Date:  11/24/2011   Anticipated DC Plan:  HOME/SELF CARE      DC Planning Services  CM consult               Status of service:  In process, will continue to follow Medicare Important Message given?  NA - LOS <3 / Initial given by admissions (If response is "NO", the following Medicare IM given date fields will be blank)  Per UR Regulation:  Reviewed for med. necessity/level of care/duration of stay  Comments:  11/23/2011- B Shameeka Silliman RN, BSN, MHA

## 2011-11-24 ENCOUNTER — Observation Stay (HOSPITAL_COMMUNITY): Payer: Medicare Other

## 2011-11-24 DIAGNOSIS — K219 Gastro-esophageal reflux disease without esophagitis: Secondary | ICD-10-CM

## 2011-11-24 DIAGNOSIS — R079 Chest pain, unspecified: Secondary | ICD-10-CM

## 2011-11-24 DIAGNOSIS — R0789 Other chest pain: Secondary | ICD-10-CM

## 2011-11-24 DIAGNOSIS — J438 Other emphysema: Secondary | ICD-10-CM

## 2011-11-24 DIAGNOSIS — I10 Essential (primary) hypertension: Secondary | ICD-10-CM

## 2011-11-24 LAB — GLUCOSE, CAPILLARY
Glucose-Capillary: 92 mg/dL (ref 70–99)
Glucose-Capillary: 127 mg/dL — ABNORMAL HIGH (ref 70–99)

## 2011-11-24 MED ORDER — TECHNETIUM TC 99M TETROFOSMIN IV KIT
10.0000 | PACK | Freq: Once | INTRAVENOUS | Status: AC | PRN
  Administered 2011-11-24: 10 via INTRAVENOUS

## 2011-11-24 MED ORDER — REGADENOSON 0.4 MG/5ML IV SOLN
INTRAVENOUS | Status: AC | PRN
  Administered 2011-11-24: 0.4 mg via INTRAVENOUS

## 2011-11-24 MED ORDER — TECHNETIUM TC 99M TETROFOSMIN IV KIT
30.0000 | PACK | Freq: Once | INTRAVENOUS | Status: AC | PRN
  Administered 2011-11-24: 30 via INTRAVENOUS

## 2011-11-24 MED ORDER — OMEPRAZOLE 20 MG PO CPDR: 40.0000 mg | DELAYED_RELEASE_CAPSULE | Freq: Two times a day (BID) | ORAL | Status: DC

## 2011-11-24 NOTE — ED Notes (Signed)
Nuclear Medicine Cardiac Stress Test with Lexiscan started at 1247.  Eugenie Norrie, PA at bedside for start and throughout exam.  Pt tolerated well with out adverse effects.

## 2011-11-24 NOTE — ED Notes (Signed)
Eugenie Norrie, PA at bedside for start and throughout exam.

## 2011-11-24 NOTE — Discharge Summary (Signed)
Katrina [name]

## 2011-11-24 NOTE — ED Notes (Signed)
Exam completed without adverse effects. Pt tolerated well, complain of mild headache pre and post exam.

## 2011-11-24 NOTE — Progress Notes (Signed)
    SUBJECTIVE: Some left sided chest pain overnight. No SOB  BP 125/84  Pulse 72  Temp 98 F (36.7 C) (Oral)  Resp 18  Ht 5\' 7"  (1.702 m)  Wt 183 lb 1.6 oz (83.054 kg)  BMI 28.68 kg/m2  SpO2 95%  Intake/Output Summary (Last 24 hours) at 11/24/11 0645 Last data filed at 11/24/11 0515  Gross per 24 hour  Intake   1020 ml  Output   1950 ml  Net   -930 ml    PHYSICAL EXAM General: Well developed, well nourished, in no acute distress. Alert and oriented x 3.  Psych:  Good affect, responds appropriately Neck: No JVD. No masses noted.  Lungs: Clear bilaterally with no wheezes or rhonci noted.  Heart: RRR with no murmurs noted. Abdomen: Bowel sounds are present. Soft, non-tender.  Extremities: No lower extremity edema.   LABS: Basic Metabolic Panel:  Basename 11/23/11 0235 11/22/11 2023  NA 138 138  K 3.7 3.6  CL 103 105  CO2 20 24  GLUCOSE 104* 89  BUN 17 16  CREATININE 0.760.77 0.80  CALCIUM 9.1 9.0  MG 2.1 --  PHOS -- --   CBC:  Basename 11/23/11 0235 11/22/11 2023  WBC 9.8 7.7  NEUTROABS -- --  HGB 14.1 14.0  HCT 41.5 41.1  MCV 89.2 89.0  PLT 239 273   Cardiac Enzymes:  Basename 11/23/11 1348 11/23/11 0810 11/23/11 0236  CKTOTAL 47 50 49  CKMB 1.8 1.6 1.4  CKMBINDEX -- -- --  TROPONINI <0.30 <0.30 <0.30   Fasting Lipid Panel:  Basename 11/23/11 0235  CHOL 160  HDL 34*  LDLCALC 98  TRIG 409  CHOLHDL 4.7  LDLDIRECT --    Current Meds:    . aspirin EC  81 mg Oral Daily  . atorvastatin  20 mg Oral q1800  . carvedilol  6.25 mg Oral BID WC  . gabapentin  400 mg Oral TID  . heparin  5,000 Units Subcutaneous Q8H  . hydrochlorothiazide  12.5 mg Oral Daily  . insulin aspart  0-9 Units Subcutaneous TID WC  . lisinopril  40 mg Oral Daily  . nicotine  14 mg Transdermal Daily  . pantoprazole (PROTONIX) IV  40 mg Intravenous Q24H  . rOPINIRole  2 mg Oral QHS  . sodium chloride  3 mL Intravenous Q12H   Echo 11/23/11:  Left ventricle: The cavity  size was normal. Wall thickness was increased in a pattern of mild LVH. Systolic function was normal. The estimated ejection fraction was in the range of 60% to 65%. Wall motion was normal; there were no regional wall motion abnormalities. Left ventricular diastolic function parameters were normal.    ASSESSMENT AND PLAN:  1. Chest pain: Negative cardiac markers. Pt with history of CAD admitted with atypical chest pains. LV function normal on echo 11/23/11. Plans for stress myoview today. If negative, can be discharged home and f/u with Dr. Olga Millers in our [facility] office.   Katrina [name]  6/19/20136:45 AM

## 2011-11-24 NOTE — H&P (Signed)
Physician Discharge Summary  Patient ID: Katrina [name] MRN: [id number] DOB/AGE: 1961/05/26 51 y.o.  Admit date: 11/22/2011 Discharge date: 11/24/2011  Primary Care Physician:  No primary provider on file.   Discharge Diagnoses:   .Chest discomfort .COPD (chronic obstructive pulmonary disease) .GERD (gastroesophageal reflux disease) .HTN (hypertension)  Medication List  As of 11/24/2011  7:02 PM   TAKE these medications         carvedilol 6.25 MG tablet   Commonly known as: COREG   Take 6.25 mg by mouth 2 (two) times daily with a meal.      gabapentin 400 MG capsule   Commonly known as: NEURONTIN   Take 400 mg by mouth 3 (three) times daily.      hydrochlorothiazide 12.5 MG capsule   Commonly known as: MICROZIDE   Take 12.5 mg by mouth daily.      lisinopril 40 MG tablet   Commonly known as: PRINIVIL,ZESTRIL   Take 40 mg by mouth daily.      omeprazole 20 MG capsule   Commonly known as: PRILOSEC   Take 2 capsules (40 mg total) by mouth 2 (two) times daily.      rOPINIRole 2 MG tablet   Commonly known as: REQUIP   Take 2 mg by mouth at bedtime.      zolpidem 10 MG tablet   Commonly known as: AMBIEN   Take 10 mg by mouth at bedtime as needed.             Disposition and Follow-up:  Patient discharge in stable and improved condition; no having any CP, SOB or acute complaints. Chest discomfort appears to be 2/2 GERD. She has been advised to increased PPI to BID, to follow lifestyle changes modifications as instructed and to establish PCP care in the area. Following cardiology recommendations; she will follow in 2 weeks with Dr. Jens Som for cardiology follow up.  Consults:   Cardiology   Significant Diagnostic Studies:  Dg Chest 2 View  11/22/2011  *RADIOLOGY REPORT*  Clinical Data: Left-sided chest pain.  Shortness of breath.  Cough. Asthma.  COPD.  CHEST - 2 VIEW  Comparison:  None.  Findings:  The heart size and mediastinal contours are within normal limits.   Both lungs are clear.  The visualized skeletal structures are unremarkable.  IMPRESSION: No active cardiopulmonary disease.  Original Report Authenticated By: Danae Orleans, M.D.    Brief H and P: For complete details please refer to admission H and P, but in brief  Patient is a 51 y/o CF with PMH of DM (currently off medication), long standing htn, copd, and reflux. Is presenting to the ED complaining of chest discomfort for the past 3 days. Mentions that the pain is a stabbing sensation at times. She feels like the pain has gone up into her left shoulder and radiated down her left arm. Does not know what makes it better or worse. Denies any recent fevers, chills, productive cough, nausea, emesis, or diaphoresis. Patient reportedly has not been taking her medication for DM but she does states that she has been taking her Blood pressure medication as well as her medication for reflux of which she takes prilosec and     Hospital Course:  1-Chest discomfort: negative cardiac enzymes; myoview and also 2-D echo. Patient advised to quit smoking; to follow heart healthy diet and to start PPI BID. Follow up with cardiology as an outpatient as per directions on Dr. Gibson Ramp note.  2-COPD (chronic obstructive pulmonary  disease): no wheezing; advice to quit smoking.  3-GERD (gastroesophageal reflux disease)patient PPI change to BID and dose increased to 40mg  instead of 20. Lifestyle chamges explained.  4-HTN (hypertension): stable and well controlled. Patient advised to follow a low sodium diet.  5-DM: currently diet control; A1C 5.7   Time spent on Discharge: 40 minutes  Signed: Saintclair Schroader 11/24/2011, 7:02 PM

## 2011-11-24 NOTE — Progress Notes (Signed)
Patient immediately expressed being uncomfortable with proceeding with stress Myoview. She doesn't think she'll be able to complete test due to back. Will instead change to [facility], consent was changed. She tolerated test well with mild SOB, HA. Await interpretation of images. Castor Gittleman PA-C

## 2011-11-25 LAB — GLUCOSE, CAPILLARY: Glucose-Capillary: 165 mg/dL — ABNORMAL HIGH (ref 70–99)

## 2011-11-30 NOTE — Discharge Summary (Signed)
Patient ID:  Katrina [name]  MRN: [id number]  DOB/AGE: 10-01-1960 51 y.o.  Admit date: 11/22/2011  Discharge date: 11/24/2011  Primary Care Physician: No primary provider on file.  Discharge Diagnoses:  .Chest discomfort .COPD (chronic obstructive pulmonary disease) .GERD (gastroesophageal reflux disease) .HTN (hypertension)  Medication List  As of 11/24/2011 7:02 PM    TAKE these medications          carvedilol 6.25 MG tablet      Commonly known as: COREG      Take 6.25 mg by mouth 2 (two) times daily with a meal.      gabapentin 400 MG capsule      Commonly known as: NEURONTIN      Take 400 mg by mouth 3 (three) times daily.      hydrochlorothiazide 12.5 MG capsule      Commonly known as: MICROZIDE      Take 12.5 mg by mouth daily.      lisinopril 40 MG tablet      Commonly known as: PRINIVIL,ZESTRIL      Take 40 mg by mouth daily.      omeprazole 20 MG capsule      Commonly known as: PRILOSEC      Take 2 capsules (40 mg total) by mouth 2 (two) times daily.      rOPINIRole 2 MG tablet      Commonly known as: REQUIP      Take 2 mg by mouth at bedtime.      zolpidem 10 MG tablet      Commonly known as: AMBIEN      Take 10 mg by mouth at bedtime as needed.         Disposition and Follow-up:  Patient discharge in stable and improved condition; no having any CP, SOB or acute complaints. Chest discomfort appears to be 2/2 GERD. She has been advised to increased PPI to BID, to follow lifestyle changes modifications as instructed and to establish PCP care in the area. Following cardiology recommendations; she will follow in 2 weeks with Dr. Jens Som for cardiology follow up.  Consults:  Cardiology  Significant Diagnostic Studies:  Dg Chest 2 View  11/22/2011 *RADIOLOGY REPORT* Clinical Data: Left-sided chest pain. Shortness of breath. Cough. Asthma. COPD. CHEST - 2 VIEW Comparison: None. Findings: The heart size and mediastinal contours are within normal limits. Both lungs are clear.  The visualized skeletal structures are unremarkable. IMPRESSION: No active cardiopulmonary disease. Original Report Authenticated By: Danae Orleans, M.D.  Brief H and P:  For complete details please refer to admission H and P, but in brief  Patient is a 51 y/o CF with PMH of DM (currently off medication), long standing htn, copd, and reflux. Is presenting to the ED complaining of chest discomfort for the past 3 days. Mentions that the pain is a stabbing sensation at times. She feels like the pain has gone up into her left shoulder and radiated down her left arm. Does not know what makes it better or worse. Denies any recent fevers, chills, productive cough, nausea, emesis, or diaphoresis. Patient reportedly has not been taking her medication for DM but she does states that she has been taking her Blood pressure medication as well as her medication for reflux of which she takes prilosec and  Hospital Course:  1-Chest discomfort: negative cardiac enzymes; myoview and also 2-D echo. Patient advised to quit smoking; to follow heart healthy diet and to start PPI BID. Follow up with  cardiology as an outpatient as per directions on Dr. Gibson Ramp note.  2-COPD (chronic obstructive pulmonary disease): no wheezing; advice to quit smoking.  3-GERD (gastroesophageal reflux disease)patient PPI change to BID and dose increased to 40mg  instead of 20. Lifestyle chamges explained.  4-HTN (hypertension): stable and well controlled. Patient advised to follow a low sodium diet.  5-DM: currently diet control; A1C 5.7  Time spent on Discharge:  40 minutes  Signed:  Tiann Saha  11/24/2011, 7:02 PM

## 2012-01-31 ENCOUNTER — Other Ambulatory Visit: Payer: Self-pay | Admitting: Family Medicine

## 2012-01-31 DIAGNOSIS — N6320 Unspecified lump in the left breast, unspecified quadrant: Secondary | ICD-10-CM

## 2012-01-31 DIAGNOSIS — R928 Other abnormal and inconclusive findings on diagnostic imaging of breast: Secondary | ICD-10-CM

## 2012-02-09 ENCOUNTER — Other Ambulatory Visit: Payer: Medicare Other

## 2012-02-11 ENCOUNTER — Other Ambulatory Visit: Payer: Self-pay | Admitting: Allergy and Immunology

## 2012-02-11 ENCOUNTER — Ambulatory Visit
Admission: RE | Admit: 2012-02-11 | Discharge: 2012-02-11 | Disposition: A | Discharge status: Home / Self Care | Payer: Medicare Other | Source: Ambulatory Visit | Attending: Allergy and Immunology | Admitting: Allergy and Immunology

## 2012-02-11 DIAGNOSIS — R0602 Shortness of breath: Secondary | ICD-10-CM

## 2012-02-18 ENCOUNTER — Ambulatory Visit
Admission: RE | Admit: 2012-02-18 | Discharge: 2012-02-18 | Disposition: A | Discharge status: Home / Self Care | Payer: Medicare Other | Source: Ambulatory Visit | Attending: Family Medicine | Admitting: Family Medicine

## 2012-02-18 DIAGNOSIS — R928 Other abnormal and inconclusive findings on diagnostic imaging of breast: Secondary | ICD-10-CM

## 2012-02-18 DIAGNOSIS — N6320 Unspecified lump in the left breast, unspecified quadrant: Secondary | ICD-10-CM

## 2012-03-03 ENCOUNTER — Ambulatory Visit (INDEPENDENT_AMBULATORY_CARE_PROVIDER_SITE_OTHER): Payer: Medicare Other | Admitting: Internal Medicine

## 2012-03-03 ENCOUNTER — Encounter: Payer: Self-pay | Admitting: Internal Medicine

## 2012-03-03 VITALS — BP 190/120 | HR 89 | Temp 98.2°F | Ht 66.0 in | Wt 190.8 lb

## 2012-03-03 DIAGNOSIS — R0600 Dyspnea, unspecified: Secondary | ICD-10-CM

## 2012-03-03 DIAGNOSIS — R0989 Other specified symptoms and signs involving the circulatory and respiratory systems: Secondary | ICD-10-CM

## 2012-03-03 DIAGNOSIS — F17200 Nicotine dependence, unspecified, uncomplicated: Secondary | ICD-10-CM

## 2012-03-03 DIAGNOSIS — Z720 Tobacco use: Secondary | ICD-10-CM

## 2012-03-03 DIAGNOSIS — R0609 Other forms of dyspnea: Secondary | ICD-10-CM

## 2012-03-03 DIAGNOSIS — R079 Chest pain, unspecified: Secondary | ICD-10-CM

## 2012-03-03 NOTE — Progress Notes (Signed)
Subjective:    Patient ID: Katrina [name], female    DOB: 1961-02-21, 51 y.o.   MRN: [id number]  HPI  PCP is Elizabeth Palau, FNP - [facility].  No cardiologist.  Body mass index is 30.80 kg/(m^2).  reports that she has been smoking Cigarettes.  She has a 40 pack-year smoking history. She does not have any smokeless tobacco history on file.   IOV 03/03/2012 51 year old disabled due to back surgery.  Known CAD and s/p MI.  She is not sure if she is diabetic but says sugars runs 200s periodically. She is originally from [place] and [place]. Moved to [facility] in 2011 and then to  [facility] in 2013. She is not a great historian  Cc: Dyspnea. Insidious onset x few years though diagnosed with copd in 1990s. Progressive past year. Rates it as severe. Always exertional. Also brought on by perfumes. Always improved by rest. Occasional present even at rest. Reports hx of  PFT in 2010 in [place] and reportedly told "you are 2 points away from o2" and diagnosed with copd. There is asspciated chest pain with exertional dyspnea that also improved with rest. She says this is gettting worse too. She says this is angina but does not have cardiologist in [place]. There is also associated weight gain of 50#, moderate cough (note on coreg and ace inhibitor), and wheezing. COPD CAT score is 30 and reflect severe symptom burden.  Symptoms also associaed with freuent AECOPD hx - 3 antibiotic courses in 2013 and 2 prednisone course in 2012 all as outpatient, Curently not on any MDI Rx  Smoker - 40 pack smoker. Continues to smoke. Tried e-cigs in past but did not work. NEver tried chantix due to bipolar disorder. Failed zyban in 1990s. Not sure if she wants to quit. Emotionally unable to quit.  Tried some laser Rx in past to help quit smoking but this failed too.   CAD - says MI diagnosd first in [place] in 2000 and 2nd at [facility] in [place] in 2008. Had unctrollable bp per hx. Apparently blockages were not severe enough to do  stent    CAT COPD Symptom & Quality of Life Score (GSK trademark) 0 is no burden. 5 is highest burden 03/03/2012   Never Cough -> Cough all the time 4  No phlegm in chest -> Chest is full of phlegm 3  No chest tightness -> Chest feels very tight 4  No dyspnea for 1 flight stairs/hill -> Very dyspneic for 1 flight of stairs 5  No limitations for ADL at home -> Very limited with ADL at home 3  Confident leaving home -> Not at all confident leaving home 5  Sleep soundly -> Do not sleep soundly because of lung condition 3  Lots of Energy -> No energy at all 3  TOTAL Score (max 40)  30       Past Medical History  Diagnosis Date  . Diabetes mellitus   . Hypertension   . Hyperlipidemia   . COPD (chronic obstructive pulmonary disease)   . Reflux   . Myocardial infarction   . CAD (coronary artery disease)   . Hypothyroid   . Lung nodules   . Diverticulosis   . Bipolar affective      Family History  Problem Relation Age of Onset  . Coronary artery disease Father   . Emphysema Father      History   Social History  . Marital Status: Single  Spouse Name: N/A    Number of Children: N/A  . Years of Education: N/A   Occupational History  .      Disability for back pain   Social History Main Topics  . Smoking status: Current Every Day Smoker -- 1.0 packs/day for 40 years    Types: Cigarettes  . Smokeless tobacco: Not on file  . Alcohol Use: No  . Drug Use: No  . Sexually Active: Not on file   Other Topics Concern  . Not on file   Social History Narrative  . No narrative on file     Allergies  Allergen Reactions  . Penicillins Anaphylaxis     Outpatient Prescriptions Prior to Visit  Medication Sig Dispense Refill  . gabapentin (NEURONTIN) 400 MG capsule Take 400 mg by mouth 3 (three) times daily.      . hydrochlorothiazide (MICROZIDE) 12.5 MG capsule Take 12.5 mg by mouth daily.      Marland Kitchen lisinopril (PRINIVIL,ZESTRIL) 40 MG tablet Take 40 mg by mouth daily.       Marland Kitchen omeprazole (PRILOSEC) 20 MG capsule Take 2 capsules (40 mg total) by mouth 2 (two) times daily.  60 capsule  1  . rOPINIRole (REQUIP) 2 MG tablet Take 2 mg by mouth at bedtime.      Marland Kitchen zolpidem (AMBIEN) 10 MG tablet Take 10 mg by mouth at bedtime as needed.      . carvedilol (COREG) 6.25 MG tablet Take 6.25 mg by mouth 2 (two) times daily with a meal.          Review of Systems  Constitutional: Negative for fever and unexpected weight change.  HENT: Positive for congestion, rhinorrhea and sneezing. Negative for ear pain, nosebleeds, sore throat, trouble swallowing, dental problem, postnasal drip and sinus pressure.   Eyes: Negative for redness and itching.  Respiratory: Positive for cough and shortness of breath. Negative for chest tightness and wheezing.   Cardiovascular: Positive for chest pain. Negative for palpitations and leg swelling.  Gastrointestinal: Negative for nausea and vomiting.  Genitourinary: Negative for dysuria.  Musculoskeletal: Negative for joint swelling.  Skin: Negative for rash.  Neurological: Negative for headaches.  Hematological: Does not bruise/bleed easily.  Psychiatric/Behavioral: Negative for dysphoric mood. The patient is not nervous/anxious.        Objective:   Physical Exam  Vitals reviewed. Constitutional: She is oriented to person, place, and time. She appears well-developed and well-nourished. No distress.  HENT:  Head: Normocephalic and atraumatic.  Right Ear: External ear normal.  Left Ear: External ear normal.  Mouth/Throat: Oropharynx is clear and moist. No oropharyngeal exudate.  Eyes: Conjunctivae normal and EOM are normal. Pupils are equal, round, and reactive to light. Right eye exhibits no discharge. Left eye exhibits no discharge. No scleral icterus.  Neck: Normal range of motion. Neck supple. No JVD present. No tracheal deviation present. No thyromegaly present.  Cardiovascular: Normal rate, regular rhythm, normal heart sounds and  intact distal pulses.  Exam reveals no gallop and no friction rub.   No murmur heard. Pulmonary/Chest: Effort normal and breath sounds normal. No respiratory distress. She has no wheezes. She has no rales. She exhibits no tenderness.  Abdominal: Soft. Bowel sounds are normal. She exhibits no distension and no mass. There is no tenderness. There is no rebound and no guarding.  Musculoskeletal: Normal range of motion. She exhibits no edema and no tenderness.  Lymphadenopathy:    She has no cervical adenopathy.  Neurological: She is alert and  oriented to person, place, and time. She has normal reflexes. No cranial nerve deficit. She exhibits normal muscle tone. Coordination normal.  Skin: Skin is warm and dry. No rash noted. She is not diaphoretic. No erythema. No pallor.  Psychiatric: She has a normal mood and affect. Her behavior is normal. Judgment and thought content normal.          Assessment & Plan:

## 2012-03-03 NOTE — Patient Instructions (Addendum)
#  Shortness of breath  - we will walk you for oyxgen levels now  - have breathing test called PFT  - after that return to see me or my NP Tammy - glad you had flu shot and pneumovax  #Chest pain  - refer [facility]  #Smoking  - we discussed quitting and its importance  #Followup After pft with Tammy the NP or myself

## 2012-03-05 ENCOUNTER — Encounter: Payer: Self-pay | Admitting: Internal Medicine

## 2012-03-05 DIAGNOSIS — R0600 Dyspnea, unspecified: Secondary | ICD-10-CM | POA: Insufficient documentation

## 2012-03-05 DIAGNOSIS — R0609 Other forms of dyspnea: Secondary | ICD-10-CM | POA: Insufficient documentation

## 2012-03-05 DIAGNOSIS — Z720 Tobacco use: Secondary | ICD-10-CM | POA: Insufficient documentation

## 2012-03-05 NOTE — Assessment & Plan Note (Signed)
She seems to be having angina along with exertional dyspnea. Will refer her to [facility] cardiology

## 2012-03-05 NOTE — Assessment & Plan Note (Signed)
SHe is not interested in quitting but acknowledges the importance of quitting

## 2012-03-05 NOTE — Assessment & Plan Note (Signed)
COPD most likely dominant component but angina equvialent possible. Obesity and decondiitoning could be playing a role  PLAN PFT and then followup with NP or myself

## 2012-03-09 ENCOUNTER — Ambulatory Visit (INDEPENDENT_AMBULATORY_CARE_PROVIDER_SITE_OTHER): Payer: Medicare Other | Admitting: Adult Health

## 2012-03-09 ENCOUNTER — Encounter: Payer: Self-pay | Admitting: Adult Health

## 2012-03-09 ENCOUNTER — Ambulatory Visit (INDEPENDENT_AMBULATORY_CARE_PROVIDER_SITE_OTHER): Payer: Medicare Other | Admitting: Internal Medicine

## 2012-03-09 VITALS — BP 136/84 | HR 77 | Temp 98.2°F | Ht 67.0 in | Wt 189.0 lb

## 2012-03-09 DIAGNOSIS — J449 Chronic obstructive pulmonary disease, unspecified: Secondary | ICD-10-CM

## 2012-03-09 DIAGNOSIS — R0600 Dyspnea, unspecified: Secondary | ICD-10-CM

## 2012-03-09 DIAGNOSIS — R0989 Other specified symptoms and signs involving the circulatory and respiratory systems: Secondary | ICD-10-CM

## 2012-03-09 DIAGNOSIS — R079 Chest pain, unspecified: Secondary | ICD-10-CM

## 2012-03-09 DIAGNOSIS — R0609 Other forms of dyspnea: Secondary | ICD-10-CM

## 2012-03-09 LAB — PULMONARY FUNCTION TEST

## 2012-03-09 NOTE — Patient Instructions (Addendum)
Most Important goal is to quit smoking  Follow up with cardiology tomorrow as planned and As needed   Discuss with your family doctor regarding your blood pressure medicine -Lisinopril as it may be causing you to cough/wheeze.  Your breathing test does not show COPD as of yet , so it is very important to quit smoking now.  follow up Dr. Marchelle Gearing in 4-6 months and As needed

## 2012-03-09 NOTE — Progress Notes (Signed)
PFT done today. 

## 2012-03-09 NOTE — Assessment & Plan Note (Addendum)
Preserved lung fxn on PFT, small reversibility in mid flows but suspect most of her symptoms  Are multifactoral with ongoing smoking. ACE could be contributing to cough and frequent flares Previoulsy on Coreg which could have been causing some symptoms as well.  Workup thus far w/ neg ambulatory desats, and CXR  DLCO is diminished.  Deconditioning and weight gain are contributing factors as well  Plan  She should keep cards follow up to r/o cardiac source to dyspnea.  Consider changing ACE inhibitor in future if cough/wheezing /freq flares are an issues.  Advised on smoking cessation.  No inhalers for now.  If remains symptoms once off ACE , consider QVAR  For possible asthma component.

## 2012-03-10 ENCOUNTER — Encounter: Payer: Self-pay | Admitting: Cardiovascular Disease

## 2012-03-10 ENCOUNTER — Ambulatory Visit (INDEPENDENT_AMBULATORY_CARE_PROVIDER_SITE_OTHER): Payer: Medicare Other | Admitting: Cardiovascular Disease

## 2012-03-10 VITALS — BP 138/82 | HR 81 | Resp 18 | Ht 67.0 in | Wt 188.0 lb

## 2012-03-10 DIAGNOSIS — I10 Essential (primary) hypertension: Secondary | ICD-10-CM

## 2012-03-10 DIAGNOSIS — I2510 Atherosclerotic heart disease of native coronary artery without angina pectoris: Secondary | ICD-10-CM

## 2012-03-10 DIAGNOSIS — R079 Chest pain, unspecified: Secondary | ICD-10-CM

## 2012-03-10 MED ORDER — LOSARTAN POTASSIUM 100 MG PO TABS: 100.0000 mg | ORAL_TABLET | Freq: Every day | ORAL | Status: DC

## 2012-03-10 NOTE — Progress Notes (Signed)
Subjective:    Patient ID: Katrina [name], female    DOB: 1961-02-23, 51 y.o.   MRN: [id number]  HPI   IOV 03/03/2012 51 year old disabled due to back surgery.  Known CAD and s/p MI.  She is not sure if she is diabetic but says sugars runs 200s periodically. She is originally from [place] and [place]. Moved to [facility] in 2011 and then to  [facility] in 2013. She is not a great historian  Cc: Dyspnea. Insidious onset x few years though diagnosed with copd in 1990s. Progressive past year. Rates it as severe. Always exertional. Also brought on by perfumes. Always improved by rest. Occasional present even at rest. Reports hx of  PFT in 2010 in [place] and reportedly told "you are 2 points away from o2" and diagnosed with copd. There is asspciated chest pain with exertional dyspnea that also improved with rest. She says this is gettting worse too. She says this is angina but does not have cardiologist in [place]. There is also associated weight gain of 50#, moderate cough (note on coreg and ace inhibitor), and wheezing. COPD CAT score is 30 and reflect severe symptom burden.  Symptoms also associaed with freuent AECOPD hx - 3 antibiotic courses in 2013 and 2 prednisone course in 2012 all as outpatient, Curently not on any MDI Rx  Smoker - 40 pack smoker. Continues to smoke. Tried e-cigs in past but did not work. NEver tried chantix due to bipolar disorder. Failed zyban in 1990s. Not sure if she wants to quit. Emotionally unable to quit.  Tried some laser Rx in past to help quit smoking but this failed too.   CAD - says MI diagnosd first in [place] in 2000 and 2nd at [facility] in [place], [place] in 2008. Had unctrollable bp per hx. Apparently blockages were not severe enough to do stent  CAT score 30   03/09/2012 Follow up and PFT  Pt returns for 1 week follow up. She has been having DOE .  Was referred last ov to cards to r/o card source as she has several risk factors . Has ov with cards tomorrow.  Today PFT shows : FEV1 2.30L  (83%) , no sign change s/p BD Ratio 79, Mid flows w/ some reversibility. DLCO diminshed at 53% No walking desats .  cxr NAD (02/2012)  We discussed smoking cessation- " I have no interest in stopping smoking , that will never happen- If I lose a lung to cancer that just happens-my dad made it ..."  We discussed deconditioning, exercise. " I am not interested in exercising"  No hemotpysis , chest pain , edema or weight loss. +weight gain over last few years, up >50lbs.  She has had several Bronchitic flare requiring steroids and abx over last years .  Has a daily cough at times.  Of note she is on ACE Inhibitor .  Previously on coreg but this was stopped by PCP recently per Pt.        Past Medical History  Diagnosis Date  . Diabetes mellitus   . Hypertension   . Hyperlipidemia   . COPD (chronic obstructive pulmonary disease)   . Reflux   . Myocardial infarction   . CAD (coronary artery disease)   . Hypothyroid   . Lung nodules   . Diverticulosis   . Bipolar affective      Family History  Problem Relation Age of Onset  . Coronary artery disease Father   . Emphysema Father  History   Social History  . Marital Status: Single    Spouse Name: N/A    Number of Children: N/A  . Years of Education: N/A   Occupational History  .      Disability for back pain   Social History Main Topics  . Smoking status: Current Every Day Smoker -- 1.0 packs/day for 40 years    Types: Cigarettes  . Smokeless tobacco: Not on file  . Alcohol Use: No  . Drug Use: No  . Sexually Active: Not on file   Other Topics Concern  . Not on file   Social History Narrative  . No narrative on file     Allergies  Allergen Reactions  . Penicillins Anaphylaxis     Outpatient Prescriptions Prior to Visit  Medication Sig Dispense Refill  . gabapentin (NEURONTIN) 400 MG capsule Take 400 mg by mouth 3 (three) times daily.      . hydrochlorothiazide (MICROZIDE) 12.5 MG capsule Take 12.5 mg  by mouth daily.      Marland Kitchen lisinopril (PRINIVIL,ZESTRIL) 40 MG tablet Take 40 mg by mouth daily.      Marland Kitchen omeprazole (PRILOSEC) 20 MG capsule Take 2 capsules (40 mg total) by mouth 2 (two) times daily.  60 capsule  1  . rOPINIRole (REQUIP) 2 MG tablet Take 2 mg by mouth at bedtime.      Marland Kitchen zolpidem (AMBIEN) 10 MG tablet Take 10 mg by mouth at bedtime as needed.      . carvedilol (COREG) 6.25 MG tablet Take 6.25 mg by mouth 2 (two) times daily with a meal.          Review of Systems  Constitutional: Negative for fever and unexpected weight change.  HENT: Positive for congestion, rhinorrhea and sneezing. Negative for ear pain, nosebleeds, sore throat, trouble swallowing, dental problem, postnasal drip and sinus pressure.   Eyes: Negative for redness and itching.  Respiratory: Positive for cough and shortness of breath. Negative for chest tightness and wheezing.   Cardiovascular: Positive for chest pain. Negative for palpitations and leg swelling.  Gastrointestinal: Negative for nausea and vomiting.  Genitourinary: Negative for dysuria.  Musculoskeletal: Negative for joint swelling.  Skin: Negative for rash.  Neurological: Negative for headaches.  Hematological: Does not bruise/bleed easily.  Psychiatric/Behavioral: Negative for dysphoric mood. The patient is not nervous/anxious.        Objective:   GEN: A/Ox3; pleasant , NAD  HEENT:  [place]/AT,  EACs-clear, TMs-wnl, NOSE-clear, THROAT-clear, no lesions, no postnasal drip or exudate noted.   NECK:  Supple w/ fair ROM; no JVD; normal carotid impulses w/o bruits; no thyromegaly or nodules palpated; no lymphadenopathy.  RESP  Clear  P & A; w/o, wheezes/ rales/ or rhonchi.no accessory muscle use, no dullness to percussion  CARD:  RRR, no m/r/g  , no peripheral edema, pulses intact, no cyanosis or clubbing.  GI:   Soft & nt; nml bowel sounds; no organomegaly or masses detected.  Musco: Warm bil, no deformities or joint swelling noted.   Neuro:  alert, no focal deficits noted.    Skin: Warm, no lesions or rashes        Assessment & Plan:

## 2012-03-10 NOTE — Patient Instructions (Addendum)
Your physician has recommended you make the following change in your medication: STOP Lisinopril, START Losartan 100mg  take one by mouth daily  Your physician wants you to follow-up in: 1 YEAR.  You will receive a reminder letter in the mail two months in advance. If you don't receive a letter, please call our office to schedule the follow-up appointment.  Your physician has requested that you have a lexiscan myoview. For further information please visit [email or web address] Please follow instruction sheet, as given.

## 2012-03-10 NOTE — Progress Notes (Signed)
HPI:  51 year old woman presenting for cardiac evaluation. The patient has a history of coronary artery disease. The specifics are not entirely clear, but it sounds like she has had nonobstructive disease. She had a cardiac catheterization most recently in 2009 at [facility] in [place]. She tells me there were 20 and 30% stenoses present.  The patient has a long-standing history of chest pain. She describes a sharp, stabbing, and squeezing pain in the chest associated with high blood pressure. She does not have symptoms with exertion, but she is sedentary. She is limited by low back problems. She also complains of progressive dyspnea with exertion. She has to rest when walking up a single flight of stairs. She feels like she "gives out." She complains of having no energy. The patient is a long-time smoker and continues to smoke one to 2 packs of cigarettes daily. She does not take aspirin because of history of a gastric ulcer. She notes at the time of her endoscopy when she was diagnosed with an ulcer she was taking a lot of ibuprofen. She has no clear history of myocardial infarction.  Outpatient Encounter Prescriptions as of 03/10/2012  Medication Sig Dispense Refill  . atorvastatin (LIPITOR) 20 MG tablet Take 20 mg by mouth daily.      . cetirizine (ZYRTEC) 10 MG tablet Take 10 mg by mouth daily.      . diphenhydrAMINE (SOMINEX) 25 MG tablet Take 25 mg by mouth at bedtime as needed.      . gabapentin (NEURONTIN) 400 MG capsule Take 400 mg by mouth 3 (three) times daily.      . hydrOXYzine (ATARAX/VISTARIL) 10 MG tablet Take 10 mg by mouth 3 (three) times daily as needed.      Marland Kitchen lisinopril (PRINIVIL,ZESTRIL) 40 MG tablet Take 40 mg by mouth daily.      . montelukast (SINGULAIR) 10 MG tablet Take 10 mg by mouth at bedtime.      Marland Kitchen rOPINIRole (REQUIP) 2 MG tablet Take 2 mg by mouth at bedtime.      Marland Kitchen zolpidem (AMBIEN) 10 MG tablet Take 10 mg by mouth at bedtime as needed.      .  hydrochlorothiazide (MICROZIDE) 12.5 MG capsule Take 12.5 mg by mouth daily.      Marland Kitchen omeprazole (PRILOSEC) 20 MG capsule Take 2 capsules (40 mg total) by mouth 2 (two) times daily.  60 capsule  1  . DISCONTD: albuterol (PROVENTIL HFA;VENTOLIN HFA) 108 (90 BASE) MCG/ACT inhaler Inhale 2 puffs into the lungs every 6 (six) hours as needed.        Penicillins  Past Medical History  Diagnosis Date  . Diabetes mellitus   . Hypertension   . Hyperlipidemia   . COPD (chronic obstructive pulmonary disease)   . Reflux   . Myocardial infarction   . CAD (coronary artery disease)   . Hypothyroid   . Lung nodules   . Diverticulosis   . Bipolar affective     Past Surgical History  Procedure Date  . Back surgery   . Abdominal hysterectomy   . Appendectomy   . Tonsillectomy   . Ankle surgery   . Cholecystectomy   . Nose surgery     History   Social History  . Marital Status: Single    Spouse Name: N/A    Number of Children: N/A  . Years of Education: N/A   Occupational History  .      Disability for back pain  Social History Main Topics  . Smoking status: Current Every Day Smoker -- 1.0 packs/day for 40 years    Types: Cigarettes  . Smokeless tobacco: Not on file  . Alcohol Use: No  . Drug Use: No  . Sexually Active: Not on file   Other Topics Concern  . Not on file   Social History Narrative  . No narrative on file    Family History  Problem Relation Age of Onset  . Coronary artery disease Father   . Emphysema Father     ROS: General: no fevers/chills/night sweats. Positive for generalized fatigue. Eyes: no blurry vision, diplopia, or amaurosis ENT: no sore throat or hearing loss Resp: no cough, wheezing, or hemoptysis CV: no edema or palpitations GI: no abdominal pain, nausea, vomiting, diarrhea, or constipation. Positive for gastroesophageal reflux GU: no dysuria, frequency, or hematuria Skin: no rash Neuro: Positive for headache, no numbness, tingling, or  weakness of extremities Musculoskeletal: Positive for back and knee pain Heme: no bleeding, DVT, or easy bruising Endo: no polydipsia or polyuria  BP 138/82  Pulse 81  Resp 18  Ht 5\' 7"  (1.702 m)  Wt 85.276 kg (188 lb)  BMI 29.44 kg/m2  SpO2 95%  PHYSICAL EXAM: Pt is alert and oriented, WD, WN, in no distress. HEENT: normal Neck: JVP normal. Carotid upstrokes normal without bruits. No thyromegaly. Lungs: equal expansion, clear bilaterally CV: Apex is discrete and nondisplaced, RRR without murmur or gallop Abd: soft, NT, +BS, no bruit, no hepatosplenomegaly Back: no CVA tenderness Ext: no C/C/E        Femoral pulses 2+= without bruits        DP/PT pulses intact and = Skin: warm and dry without rash Neuro: CNII-XII intact             Strength intact = bilaterally  EKG:  Normal sinus rhythm 87 beats per minute, within normal limits.  ASSESSMENT AND PLAN: 1. Coronary artery disease with progressive chest pain and shortness of breath. The patient sounds like she has nonobstructive disease. We're going to request her cardiac catheterization report from 2009. She clearly has progressive symptoms and I think a pharmacologic nuclear scan is appropriate to evaluate her for ischemia. She will continue on her current medical program for now. It is probably best that she stay off of aspirin considering her history of gastric ulcer. Obviously if her stress test is abnormal this would change things with respect to the need for antiplatelet therapy.  2. Hyperlipidemia. The patient is on a statin drug. Her labs are followed by her primary care physician. Her goal LDL is less than 100 mg/dL.  3. Hypertension. Her blood pressure control seems good at the present time. She has a cough and with her heavy smoking and will be difficult to sort out if lisinopril is contributing. I'm going to switch her to an angiotensin receptor blocker. She will stop lisinopril 40 mg daily and start taking losartan 100  mg daily.  For followup, I will plan on seeing her yearly. We will notify her of her stress test results as soon as they're available. She was counseled regarding the importance of tobacco cessation.  Tonny Bollman 03/10/2012 5:47 PM

## 2012-03-16 ENCOUNTER — Encounter (HOSPITAL_COMMUNITY): Payer: Medicare Other

## 2012-04-02 ENCOUNTER — Encounter (HOSPITAL_COMMUNITY): Payer: Self-pay | Admitting: *Deleted

## 2012-04-02 ENCOUNTER — Emergency Department (HOSPITAL_COMMUNITY)
Admission: EM | Admit: 2012-04-02 | Discharge: 2012-04-03 | Disposition: A | Discharge status: Home / Self Care | Payer: Medicare Other | Attending: Emergency Medicine | Admitting: Emergency Medicine

## 2012-04-02 ENCOUNTER — Emergency Department (HOSPITAL_COMMUNITY): Payer: Medicare Other

## 2012-04-02 DIAGNOSIS — E039 Hypothyroidism, unspecified: Secondary | ICD-10-CM | POA: Insufficient documentation

## 2012-04-02 DIAGNOSIS — M940 Chondrocostal junction syndrome [Tietze]: Secondary | ICD-10-CM | POA: Insufficient documentation

## 2012-04-02 DIAGNOSIS — N318 Other neuromuscular dysfunction of bladder: Secondary | ICD-10-CM | POA: Insufficient documentation

## 2012-04-02 DIAGNOSIS — J4489 Other specified chronic obstructive pulmonary disease: Secondary | ICD-10-CM | POA: Insufficient documentation

## 2012-04-02 DIAGNOSIS — E785 Hyperlipidemia, unspecified: Secondary | ICD-10-CM | POA: Insufficient documentation

## 2012-04-02 DIAGNOSIS — I2510 Atherosclerotic heart disease of native coronary artery without angina pectoris: Secondary | ICD-10-CM | POA: Insufficient documentation

## 2012-04-02 DIAGNOSIS — F319 Bipolar disorder, unspecified: Secondary | ICD-10-CM | POA: Insufficient documentation

## 2012-04-02 DIAGNOSIS — I252 Old myocardial infarction: Secondary | ICD-10-CM | POA: Insufficient documentation

## 2012-04-02 DIAGNOSIS — J438 Other emphysema: Secondary | ICD-10-CM | POA: Insufficient documentation

## 2012-04-02 DIAGNOSIS — F17200 Nicotine dependence, unspecified, uncomplicated: Secondary | ICD-10-CM | POA: Insufficient documentation

## 2012-04-02 DIAGNOSIS — Z79899 Other long term (current) drug therapy: Secondary | ICD-10-CM | POA: Insufficient documentation

## 2012-04-02 DIAGNOSIS — I10 Essential (primary) hypertension: Secondary | ICD-10-CM | POA: Insufficient documentation

## 2012-04-02 DIAGNOSIS — J449 Chronic obstructive pulmonary disease, unspecified: Secondary | ICD-10-CM | POA: Insufficient documentation

## 2012-04-02 DIAGNOSIS — K219 Gastro-esophageal reflux disease without esophagitis: Secondary | ICD-10-CM | POA: Insufficient documentation

## 2012-04-02 DIAGNOSIS — E119 Type 2 diabetes mellitus without complications: Secondary | ICD-10-CM | POA: Insufficient documentation

## 2012-04-02 DIAGNOSIS — R079 Chest pain, unspecified: Secondary | ICD-10-CM | POA: Insufficient documentation

## 2012-04-02 LAB — CBC
HCT: 40.2 % (ref 36.0–46.0)
Hemoglobin: 13.9 g/dL (ref 12.0–15.0)
MCH: 30.2 pg (ref 26.0–34.0)
MCHC: 34.6 g/dL (ref 30.0–36.0)
MCV: 87.2 fL (ref 78.0–100.0)
Platelets: 234 K/uL (ref 150–400)
RBC: 4.61 MIL/uL (ref 3.87–5.11)
RDW: 13.7 % (ref 11.5–15.5)
WBC: 8.3 K/uL (ref 4.0–10.5)

## 2012-04-02 MED ORDER — KETOROLAC TROMETHAMINE 30 MG/ML IJ SOLN
30.0000 mg | Freq: Once | INTRAMUSCULAR | Status: AC
  Administered 2012-04-02: 30 mg via INTRAMUSCULAR
  Filled 2012-04-02: qty 1

## 2012-04-02 MED ORDER — NITROGLYCERIN 0.4 MG SL SUBL
0.4000 mg | SUBLINGUAL_TABLET | SUBLINGUAL | Status: DC | PRN
  Filled 2012-04-02: qty 25

## 2012-04-02 NOTE — ED Notes (Signed)
Pt woke up at 3:30 with left shoulder pain radiating to left arm to elbow, left side of neck and chest to under left breast; hurts to raise and lower left arm; pt radiates to the left scapula area; mild shortness of breath; no N/V

## 2012-04-02 NOTE — ED Notes (Signed)
EKG printed and given to EDP Lynelle Doctor for review. Last confirmed printed for comparison

## 2012-04-02 NOTE — ED Provider Notes (Signed)
History     CSN: [id number]  Arrival date & time 04/02/12  2128   First MD Initiated Contact with Patient 04/02/12 2243      Chief Complaint  Patient presents with  . Arm Pain  . Chest Pain    (Consider location/radiation/quality/duration/timing/severity/associated sxs/prior treatment) HPI Comments: 51 y/o female presents to the ED with sudden onset left shoulder pain that woke her up from sleep around 3:30 am this morning. Pain radiates to the left side of her chest and neck around to near her left scapula. Pain is sharp and constant worsening throughout the day rated 9/10. Worse with moving her arm and deep inspiration. Tried taking ibuprofen without any relief. No known injury or trauma. Admits to associated diaphoresis on and off throughout the day along with sob at rest and exertion. No nausea or vomiting. No recent long car rides or plane rides, no recent surgeries. She is a daily smoker. Has a history of MI in 2000. States this pain feels a little different. Also has hx of lung nodules.  Patient is a 51 y.o. female presenting with arm pain and chest pain. The history is provided by the patient.  Arm Pain Associated symptoms include chest pain, diaphoresis and neck pain. Pertinent negatives include no headaches, nausea, rash, vomiting or weakness.  Chest Pain Primary symptoms include shortness of breath. Pertinent negatives for primary symptoms include no nausea, no vomiting and no dizziness.  Associated symptoms include diaphoresis.  Pertinent negatives for associated symptoms include no weakness.     Past Medical History  Diagnosis Date  . Diabetes mellitus   . Hypertension   . Hyperlipidemia   . COPD (chronic obstructive pulmonary disease)   . Reflux   . Myocardial infarction   . CAD (coronary artery disease)   . Hypothyroid   . Lung nodules   . Diverticulosis   . Bipolar affective     Past Surgical History  Procedure Date  . Back surgery   . Abdominal  hysterectomy   . Appendectomy   . Tonsillectomy   . Ankle surgery   . Cholecystectomy   . Nose surgery     Family History  Problem Relation Age of Onset  . Coronary artery disease Father   . Emphysema Father     History  Substance Use Topics  . Smoking status: Current Every Day Smoker -- 1.0 packs/day for 40 years    Types: Cigarettes  . Smokeless tobacco: Not on file  . Alcohol Use: No    OB History    Grav Para Term Preterm Abortions TAB SAB Ect Mult Living                  Review of Systems  Constitutional: Positive for diaphoresis.  HENT: Positive for neck pain.   Eyes: Negative for visual disturbance.  Respiratory: Positive for shortness of breath.   Cardiovascular: Positive for chest pain. Negative for leg swelling.  Gastrointestinal: Negative for nausea and vomiting.  Genitourinary: Negative for difficulty urinating.  Musculoskeletal: Positive for back pain.  Skin: Negative for rash.  Neurological: Negative for dizziness, weakness, light-headedness and headaches.  Psychiatric/Behavioral: Negative for confusion.    Allergies  Penicillins  Home Medications   Current Outpatient Rx  Name Route Sig Dispense Refill  . ALBUTEROL SULFATE HFA 108 (90 BASE) MCG/ACT IN AERS Inhalation Inhale 2 puffs into the lungs every 6 (six) hours as needed. wheezing    . BECLOMETHASONE DIPROPIONATE 80 MCG/ACT IN AERS Inhalation Inhale 2 puffs  into the lungs 2 (two) times daily.    Marland Kitchen CETIRIZINE HCL 10 MG PO TABS Oral Take 10 mg by mouth daily.    Marland Kitchen DIPHENHYDRAMINE HCL 12.5 MG/5ML PO ELIX Oral Take 25 mg by mouth 4 (four) times daily as needed. Takes as needed with epi-pen for food allergies    . EPINEPHRINE 0.3 MG/0.3ML IJ DEVI Intramuscular Inject 0.3 mg into the muscle once.    Marland Kitchen GABAPENTIN 400 MG PO CAPS Oral Take 400 mg by mouth 3 (three) times daily.    Marland Kitchen HYDROXYZINE HCL 10 MG PO TABS Oral Take 10 mg by mouth 3 (three) times daily as needed.    Marland Kitchen LOSARTAN POTASSIUM 100 MG  PO TABS Oral Take 1 tablet (100 mg total) by mouth daily. 30 tablet 11  . MONTELUKAST SODIUM 10 MG PO TABS Oral Take 10 mg by mouth at bedtime.    . OMEPRAZOLE 20 MG PO CPDR Oral Take 2 capsules (40 mg total) by mouth 2 (two) times daily. 60 capsule 1  . ROPINIROLE HCL 2 MG PO TABS Oral Take 3 mg by mouth at bedtime.     Marland Kitchen ZOLPIDEM TARTRATE 10 MG PO TABS Oral Take 10 mg by mouth at bedtime as needed.      BP 156/68  Pulse 88  Resp 16  SpO2 97%  Physical Exam  Constitutional: She is oriented to person, place, and time. She appears well-developed and well-nourished. No distress.  HENT:  Head: Normocephalic and atraumatic.  Mouth/Throat: Oropharynx is clear and moist.  Eyes: Conjunctivae normal and EOM are normal. Pupils are equal, round, and reactive to light.  Neck: Normal range of motion. Neck supple.  Cardiovascular: Normal rate, regular rhythm, normal heart sounds and intact distal pulses.   Pulmonary/Chest: Effort normal and breath sounds normal. She exhibits tenderness (point tenderness in lateral left 5-7 rib region).  Abdominal: Soft. Bowel sounds are normal. There is no tenderness.  Musculoskeletal: Normal range of motion. She exhibits no edema.  Neurological: She is alert and oriented to person, place, and time.  Skin: Skin is warm and dry. No rash noted.  Psychiatric: She has a normal mood and affect. Her behavior is normal.    ED Course  Procedures (including critical care time)  Labs Reviewed  BASIC METABOLIC PANEL - Abnormal; Notable for the following:    GFR calc non Af Amer 78 (*)     All other components within normal limits  CBC  POCT I-STAT TROPONIN I   Date: 04/02/2012  Rate: 82  Rhythm: normal sinus rhythm  QRS Axis: normal  Intervals: normal  ST/T Wave abnormalities: normal  Conduction Disutrbances:none  Narrative Interpretation: no stemi  Old EKG Reviewed: unchanged   Dg Chest 2 View  04/02/2012  *RADIOLOGY REPORT*  Clinical Data: Neck pain  radiating into the left arm.  CHEST - 2 VIEW  Comparison: PA and lateral chest 11/22/2011.  Findings: Lungs clear. Heart size normal. No pneumothorax or pleural effusion. Heart size normal.  IMPRESSION: No acute disease.   Original Report Authenticated By: Bernadene Bell. D'ALESSIO, M.D.      1. Costochondritis       MDM  51 y/o female with sudden onset left shoulder pain radiating into her chest and back. Pain reproducible with palpation. SL nitro provided no relief. Toradol decreased her pain. Troponin negative. CXR unremarkable. Recent stress test in June 2013 without any acute concern. She has a cardiologist and pulmonologist who she recently saw. No concern for cardiac  reasoning for her current pain and exam more consistent with costochondritis. Will discharge advising her to take ibuprofen, ice, rest and f/u with cardiology. Case discussed with Dr. Lynelle Doctor who agrees with plan of care.        Trevor Mace, PA-C 04/03/12 [phone]

## 2012-04-03 LAB — POCT I-STAT TROPONIN I: Troponin i, poc: 0 ng/mL (ref 0.00–0.08)

## 2012-04-03 LAB — BASIC METABOLIC PANEL
Calcium: 9.2 mg/dL (ref 8.4–10.5)
GFR calc Af Amer: >90 mL/min (ref >90)
GFR calc non Af Amer: 78 mL/min — ABNORMAL LOW (ref >90)
Sodium: 140 mEq/L (ref 135–145)

## 2012-04-03 NOTE — ED Notes (Signed)
Patient is alert and oriented x3.  She was given DC instructions and follow up visit instructions.  Patient gave verbal understanding. She was DC ambulatory under his own power to home.  V/S stable.  He was not showing any signs of distress on DC 

## 2012-04-05 NOTE — ED Provider Notes (Signed)
Medical screening examination/treatment/procedure(s) were performed by non-physician practitioner and as supervising physician I was immediately available for consultation/collaboration.    Karin Pinedo R Rody Keadle, MD 04/05/12 0657 

## 2012-07-10 ENCOUNTER — Telehealth: Payer: Self-pay | Admitting: Internal Medicine

## 2012-07-10 NOTE — Telephone Encounter (Signed)
Attempted to call patient 3x to schedule a follow up appointment. No return call back. Sent letter 07/10/12 °

## 2013-06-19 DIAGNOSIS — R0789 Other chest pain: Secondary | ICD-10-CM | POA: Diagnosis not present

## 2013-06-19 DIAGNOSIS — R0602 Shortness of breath: Secondary | ICD-10-CM | POA: Diagnosis not present

## 2013-06-19 DIAGNOSIS — M542 Cervicalgia: Secondary | ICD-10-CM | POA: Diagnosis not present

## 2013-06-19 DIAGNOSIS — R079 Chest pain, unspecified: Secondary | ICD-10-CM | POA: Diagnosis not present

## 2013-06-19 DIAGNOSIS — R42 Dizziness and giddiness: Secondary | ICD-10-CM | POA: Diagnosis not present

## 2013-06-19 DIAGNOSIS — Z8679 Personal history of other diseases of the circulatory system: Secondary | ICD-10-CM | POA: Diagnosis not present

## 2013-06-19 DIAGNOSIS — I10 Essential (primary) hypertension: Secondary | ICD-10-CM | POA: Diagnosis not present

## 2013-06-22 DIAGNOSIS — I10 Essential (primary) hypertension: Secondary | ICD-10-CM | POA: Diagnosis not present

## 2013-06-22 DIAGNOSIS — J449 Chronic obstructive pulmonary disease, unspecified: Secondary | ICD-10-CM | POA: Diagnosis not present

## 2013-06-22 DIAGNOSIS — I2510 Atherosclerotic heart disease of native coronary artery without angina pectoris: Secondary | ICD-10-CM | POA: Diagnosis not present

## 2013-06-22 DIAGNOSIS — D493 Neoplasm of unspecified behavior of breast: Secondary | ICD-10-CM | POA: Diagnosis not present

## 2013-06-22 DIAGNOSIS — E669 Obesity, unspecified: Secondary | ICD-10-CM | POA: Diagnosis not present

## 2013-06-22 DIAGNOSIS — E785 Hyperlipidemia, unspecified: Secondary | ICD-10-CM | POA: Diagnosis not present

## 2013-06-22 DIAGNOSIS — G909 Disorder of the autonomic nervous system, unspecified: Secondary | ICD-10-CM | POA: Diagnosis not present

## 2013-06-22 DIAGNOSIS — M961 Postlaminectomy syndrome, not elsewhere classified: Secondary | ICD-10-CM | POA: Diagnosis not present

## 2013-06-28 DIAGNOSIS — E119 Type 2 diabetes mellitus without complications: Secondary | ICD-10-CM | POA: Diagnosis not present

## 2013-06-28 DIAGNOSIS — I4891 Unspecified atrial fibrillation: Secondary | ICD-10-CM | POA: Diagnosis not present

## 2013-06-28 DIAGNOSIS — I4510 Unspecified right bundle-branch block: Secondary | ICD-10-CM | POA: Diagnosis not present

## 2013-06-28 DIAGNOSIS — R9431 Abnormal electrocardiogram [ECG] [EKG]: Secondary | ICD-10-CM | POA: Diagnosis not present

## 2013-06-28 DIAGNOSIS — R079 Chest pain, unspecified: Secondary | ICD-10-CM | POA: Diagnosis not present

## 2013-06-28 DIAGNOSIS — E669 Obesity, unspecified: Secondary | ICD-10-CM | POA: Diagnosis not present

## 2013-06-28 DIAGNOSIS — I10 Essential (primary) hypertension: Secondary | ICD-10-CM | POA: Diagnosis not present

## 2013-06-28 DIAGNOSIS — E785 Hyperlipidemia, unspecified: Secondary | ICD-10-CM | POA: Diagnosis not present

## 2013-06-28 DIAGNOSIS — I509 Heart failure, unspecified: Secondary | ICD-10-CM | POA: Diagnosis not present

## 2013-06-28 DIAGNOSIS — G909 Disorder of the autonomic nervous system, unspecified: Secondary | ICD-10-CM | POA: Diagnosis not present

## 2013-06-28 DIAGNOSIS — I2510 Atherosclerotic heart disease of native coronary artery without angina pectoris: Secondary | ICD-10-CM | POA: Diagnosis not present

## 2013-06-28 DIAGNOSIS — R011 Cardiac murmur, unspecified: Secondary | ICD-10-CM | POA: Diagnosis not present

## 2013-07-02 DIAGNOSIS — I701 Atherosclerosis of renal artery: Secondary | ICD-10-CM | POA: Diagnosis not present

## 2013-07-06 DIAGNOSIS — I701 Atherosclerosis of renal artery: Secondary | ICD-10-CM | POA: Diagnosis not present

## 2013-07-06 DIAGNOSIS — J449 Chronic obstructive pulmonary disease, unspecified: Secondary | ICD-10-CM | POA: Diagnosis not present

## 2013-07-06 DIAGNOSIS — I10 Essential (primary) hypertension: Secondary | ICD-10-CM | POA: Diagnosis not present

## 2013-07-06 DIAGNOSIS — J309 Allergic rhinitis, unspecified: Secondary | ICD-10-CM | POA: Diagnosis not present

## 2013-07-11 DIAGNOSIS — L821 Other seborrheic keratosis: Secondary | ICD-10-CM | POA: Diagnosis not present

## 2013-07-11 DIAGNOSIS — D2360 Other benign neoplasm of skin of unspecified upper limb, including shoulder: Secondary | ICD-10-CM | POA: Diagnosis not present

## 2013-07-11 DIAGNOSIS — D1801 Hemangioma of skin and subcutaneous tissue: Secondary | ICD-10-CM | POA: Diagnosis not present

## 2013-07-16 DIAGNOSIS — R42 Dizziness and giddiness: Secondary | ICD-10-CM | POA: Diagnosis not present

## 2013-07-16 DIAGNOSIS — R030 Elevated blood-pressure reading, without diagnosis of hypertension: Secondary | ICD-10-CM | POA: Diagnosis not present

## 2013-07-16 DIAGNOSIS — R5381 Other malaise: Secondary | ICD-10-CM | POA: Diagnosis not present

## 2013-07-16 DIAGNOSIS — I10 Essential (primary) hypertension: Secondary | ICD-10-CM | POA: Diagnosis not present

## 2013-07-16 DIAGNOSIS — R5383 Other fatigue: Secondary | ICD-10-CM | POA: Diagnosis not present

## 2013-07-16 DIAGNOSIS — R51 Headache: Secondary | ICD-10-CM | POA: Diagnosis not present

## 2013-07-26 IMAGING — CR DG CHEST 2V
2 series · 2 of 2 positions shown · non-contrast
Comparison: None.

CLINICAL DATA: Left-sided chest pain.  Shortness of breath.  Cough.
Asthma.  COPD.

CHEST - 2 VIEW

[w chest pa]
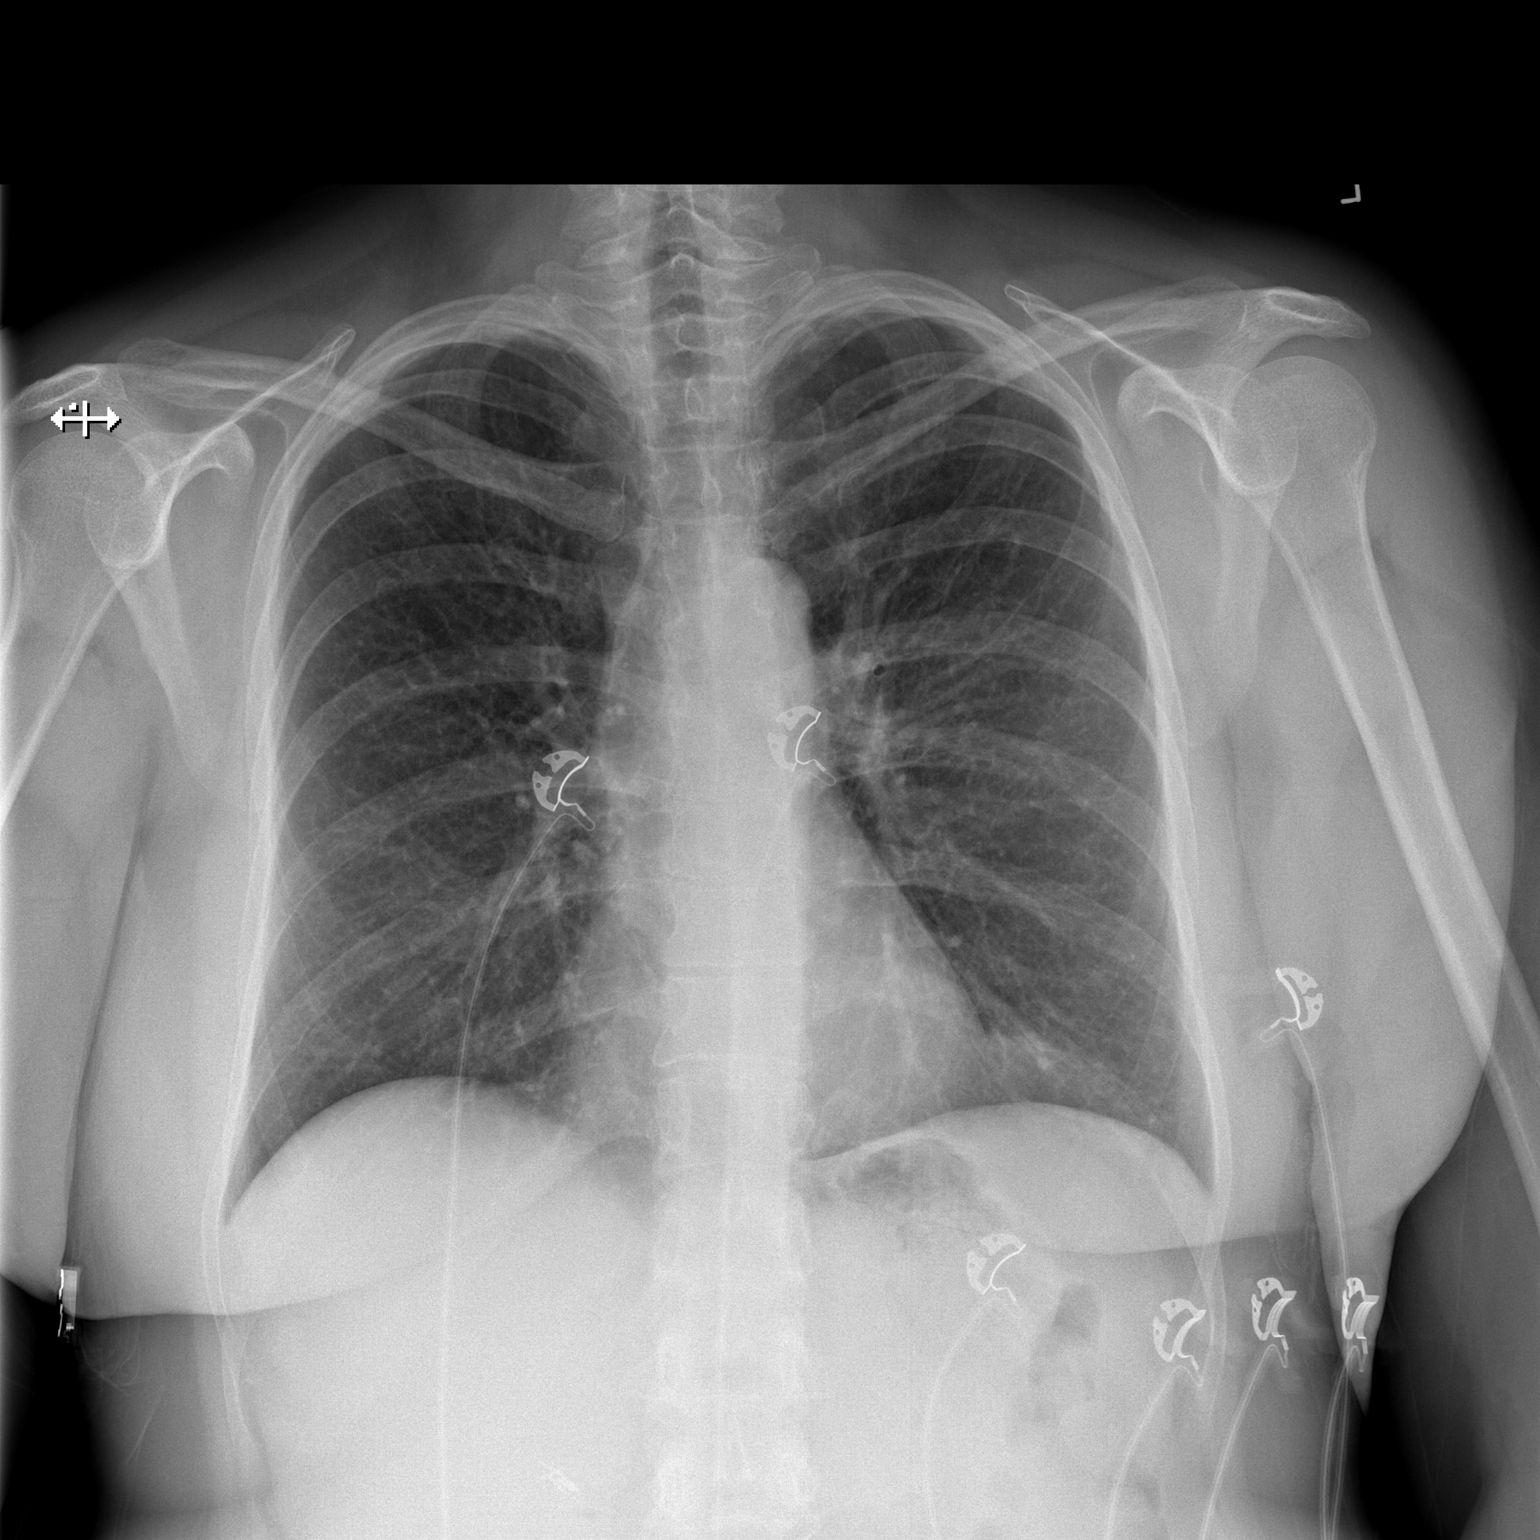

[w chest lat]
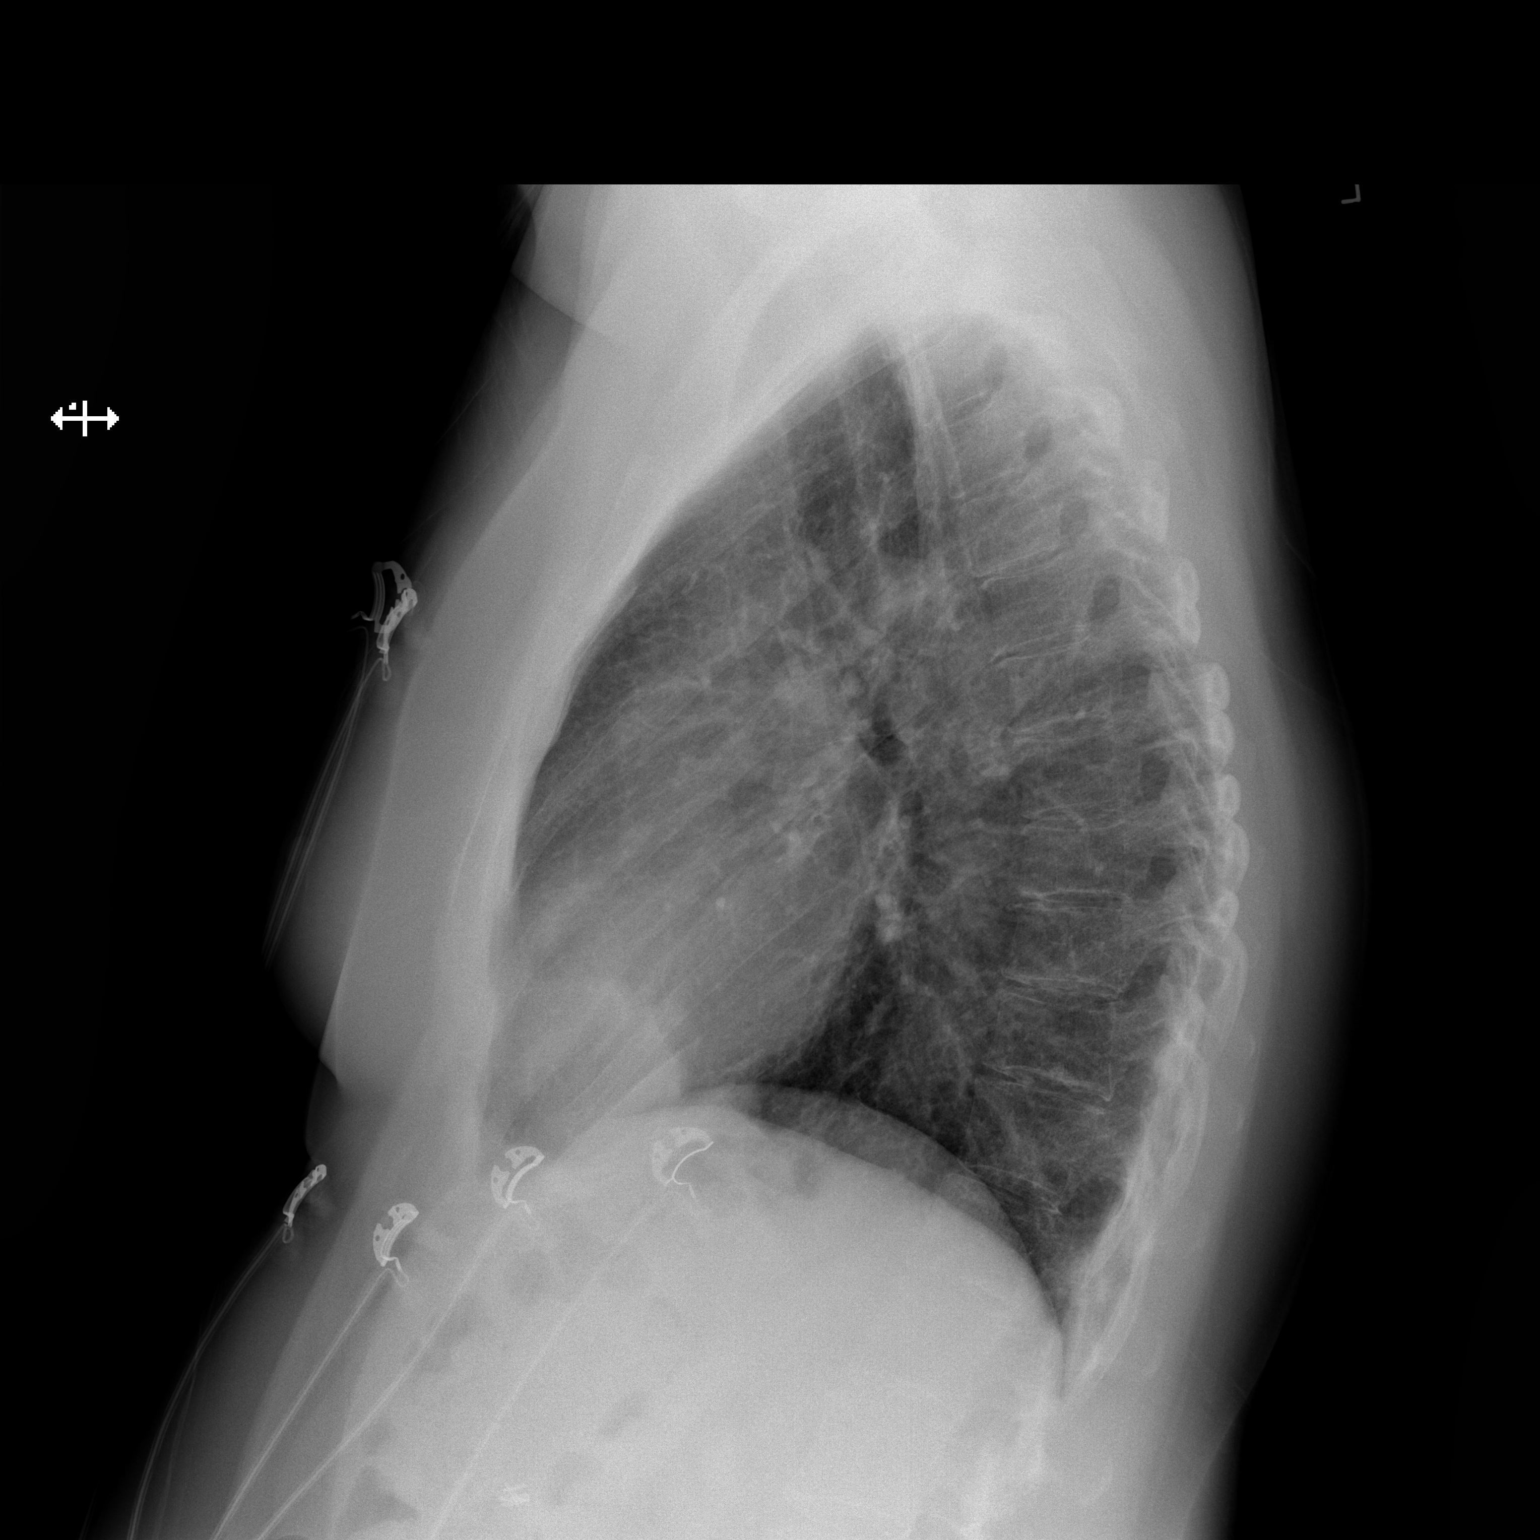

[2 of 2 positions shown; findings below may reference images not displayed]

FINDINGS: The heart size and mediastinal contours are within
normal limits.  Both lungs are clear.  The visualized skeletal
structures are unremarkable.
IMPRESSION: No active cardiopulmonary disease.

## 2013-07-27 DIAGNOSIS — J310 Chronic rhinitis: Secondary | ICD-10-CM | POA: Diagnosis not present

## 2013-07-27 DIAGNOSIS — J309 Allergic rhinitis, unspecified: Secondary | ICD-10-CM | POA: Diagnosis not present

## 2013-07-30 DIAGNOSIS — J069 Acute upper respiratory infection, unspecified: Secondary | ICD-10-CM | POA: Diagnosis not present

## 2013-07-30 DIAGNOSIS — I10 Essential (primary) hypertension: Secondary | ICD-10-CM | POA: Diagnosis not present

## 2013-07-30 DIAGNOSIS — J40 Bronchitis, not specified as acute or chronic: Secondary | ICD-10-CM | POA: Diagnosis not present

## 2013-07-30 DIAGNOSIS — K5289 Other specified noninfective gastroenteritis and colitis: Secondary | ICD-10-CM | POA: Diagnosis not present

## 2013-08-02 DIAGNOSIS — M961 Postlaminectomy syndrome, not elsewhere classified: Secondary | ICD-10-CM | POA: Diagnosis not present

## 2013-08-02 DIAGNOSIS — M5430 Sciatica, unspecified side: Secondary | ICD-10-CM | POA: Diagnosis not present

## 2013-08-03 DIAGNOSIS — M5106 Intervertebral disc disorders with myelopathy, lumbar region: Secondary | ICD-10-CM | POA: Diagnosis not present

## 2013-08-03 DIAGNOSIS — M961 Postlaminectomy syndrome, not elsewhere classified: Secondary | ICD-10-CM | POA: Diagnosis not present

## 2013-08-03 DIAGNOSIS — M5126 Other intervertebral disc displacement, lumbar region: Secondary | ICD-10-CM | POA: Diagnosis not present

## 2013-08-03 DIAGNOSIS — E039 Hypothyroidism, unspecified: Secondary | ICD-10-CM | POA: Diagnosis not present

## 2013-08-03 DIAGNOSIS — M5430 Sciatica, unspecified side: Secondary | ICD-10-CM | POA: Diagnosis not present

## 2013-08-07 DIAGNOSIS — S335XXA Sprain of ligaments of lumbar spine, initial encounter: Secondary | ICD-10-CM | POA: Diagnosis not present

## 2013-08-07 DIAGNOSIS — I10 Essential (primary) hypertension: Secondary | ICD-10-CM | POA: Diagnosis not present

## 2013-08-07 DIAGNOSIS — M549 Dorsalgia, unspecified: Secondary | ICD-10-CM | POA: Diagnosis not present

## 2013-08-10 DIAGNOSIS — D72829 Elevated white blood cell count, unspecified: Secondary | ICD-10-CM | POA: Diagnosis not present

## 2013-08-10 DIAGNOSIS — F17200 Nicotine dependence, unspecified, uncomplicated: Secondary | ICD-10-CM | POA: Diagnosis not present

## 2013-08-10 DIAGNOSIS — M545 Low back pain, unspecified: Secondary | ICD-10-CM | POA: Diagnosis not present

## 2013-08-10 DIAGNOSIS — I10 Essential (primary) hypertension: Secondary | ICD-10-CM | POA: Diagnosis not present

## 2013-08-10 DIAGNOSIS — E669 Obesity, unspecified: Secondary | ICD-10-CM | POA: Diagnosis not present

## 2013-08-10 DIAGNOSIS — R509 Fever, unspecified: Secondary | ICD-10-CM | POA: Diagnosis not present

## 2013-08-10 DIAGNOSIS — M5430 Sciatica, unspecified side: Secondary | ICD-10-CM | POA: Diagnosis not present

## 2013-08-10 DIAGNOSIS — K219 Gastro-esophageal reflux disease without esophagitis: Secondary | ICD-10-CM | POA: Diagnosis not present

## 2013-08-10 DIAGNOSIS — M961 Postlaminectomy syndrome, not elsewhere classified: Secondary | ICD-10-CM | POA: Diagnosis not present

## 2013-08-10 DIAGNOSIS — E119 Type 2 diabetes mellitus without complications: Secondary | ICD-10-CM | POA: Diagnosis not present

## 2013-08-10 DIAGNOSIS — M549 Dorsalgia, unspecified: Secondary | ICD-10-CM | POA: Diagnosis not present

## 2013-08-11 DIAGNOSIS — M545 Low back pain, unspecified: Secondary | ICD-10-CM | POA: Diagnosis not present

## 2013-08-11 DIAGNOSIS — R1032 Left lower quadrant pain: Secondary | ICD-10-CM | POA: Diagnosis not present

## 2013-08-11 DIAGNOSIS — M5126 Other intervertebral disc displacement, lumbar region: Secondary | ICD-10-CM | POA: Diagnosis not present

## 2013-08-11 DIAGNOSIS — R32 Unspecified urinary incontinence: Secondary | ICD-10-CM | POA: Diagnosis not present

## 2013-08-11 DIAGNOSIS — R209 Unspecified disturbances of skin sensation: Secondary | ICD-10-CM | POA: Diagnosis not present

## 2013-08-11 DIAGNOSIS — R319 Hematuria, unspecified: Secondary | ICD-10-CM | POA: Diagnosis not present

## 2013-08-11 DIAGNOSIS — M79609 Pain in unspecified limb: Secondary | ICD-10-CM | POA: Diagnosis not present

## 2013-08-12 DIAGNOSIS — D72829 Elevated white blood cell count, unspecified: Secondary | ICD-10-CM | POA: Diagnosis not present

## 2013-08-12 DIAGNOSIS — W19XXXA Unspecified fall, initial encounter: Secondary | ICD-10-CM | POA: Diagnosis not present

## 2013-08-12 DIAGNOSIS — R52 Pain, unspecified: Secondary | ICD-10-CM | POA: Diagnosis not present

## 2013-08-12 DIAGNOSIS — M545 Low back pain, unspecified: Secondary | ICD-10-CM | POA: Diagnosis not present

## 2013-08-30 DIAGNOSIS — M961 Postlaminectomy syndrome, not elsewhere classified: Secondary | ICD-10-CM | POA: Diagnosis not present

## 2013-08-30 DIAGNOSIS — M5430 Sciatica, unspecified side: Secondary | ICD-10-CM | POA: Diagnosis not present

## 2013-09-02 IMAGING — CR DG CHEST 2V
2 series · 2 of 2 positions shown · non-contrast
Comparison: None.

CLINICAL DATA: Recent cough, congestion and shortness of breath.

CHEST - 2 VIEW

[view not recorded (1 of 2)]
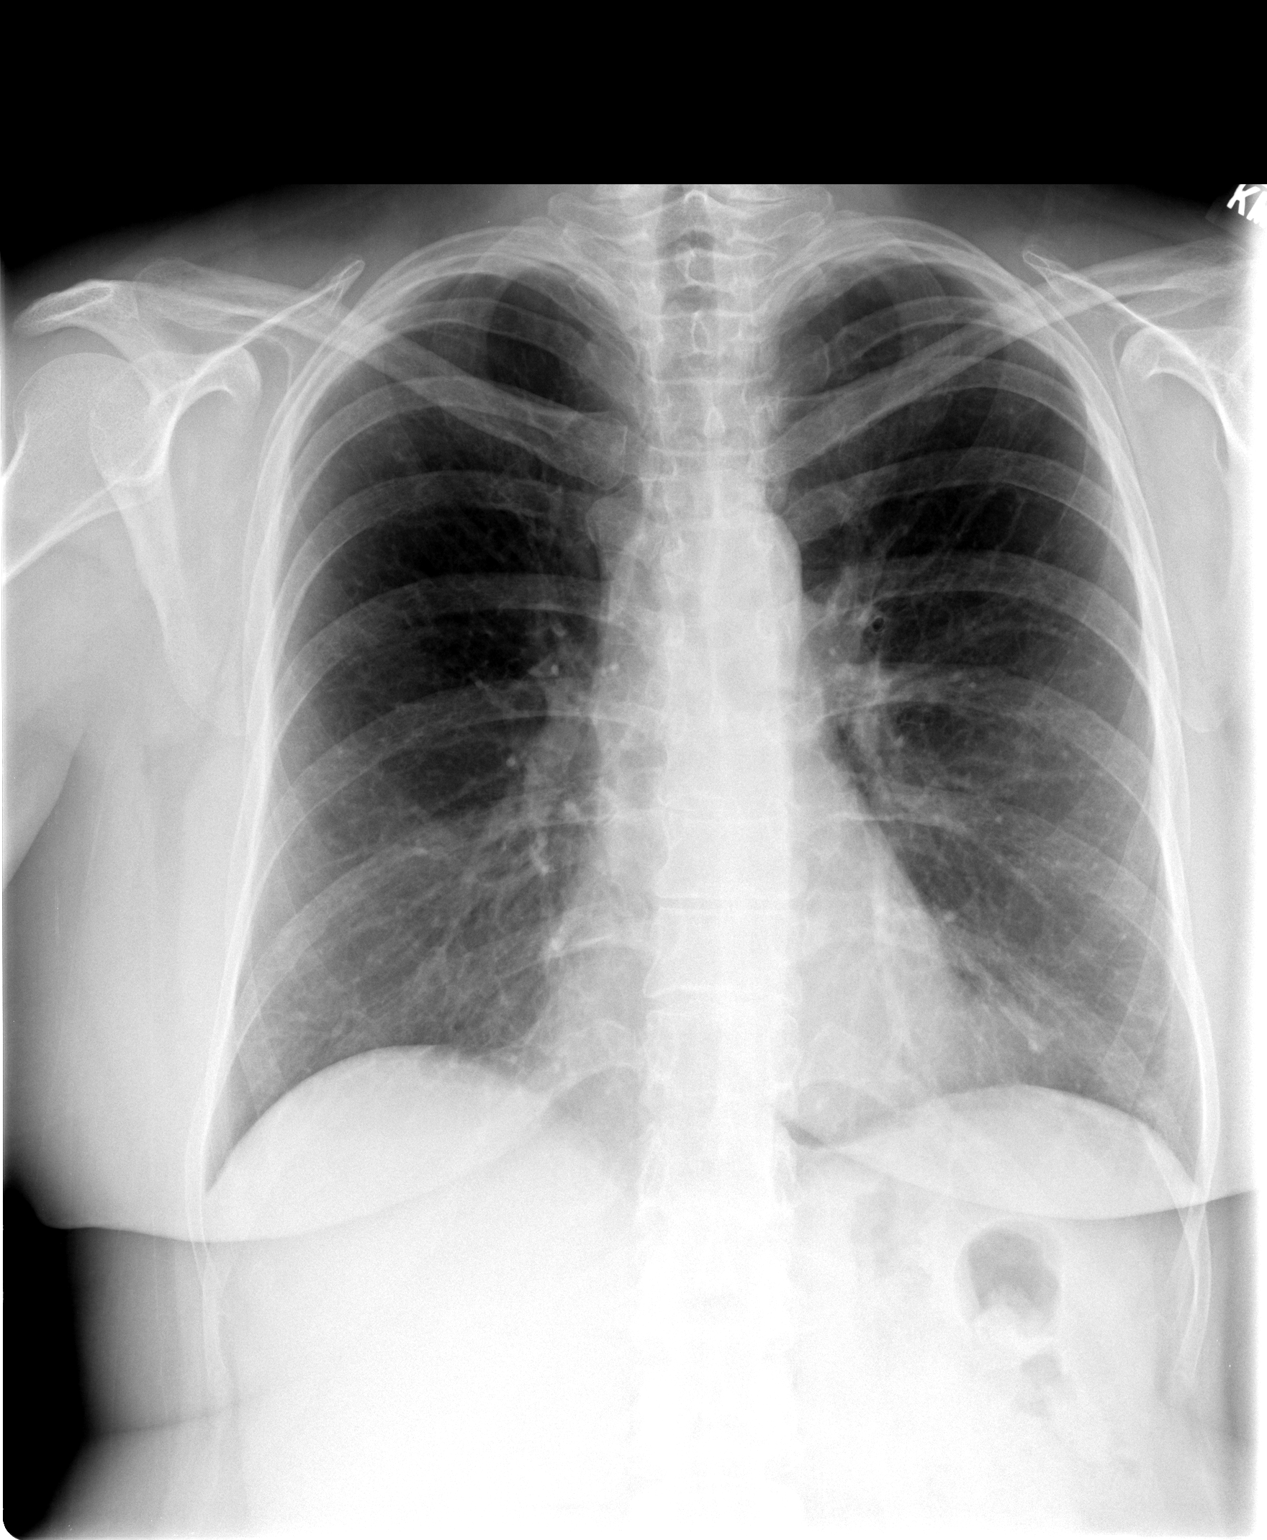

[view not recorded (2 of 2)]
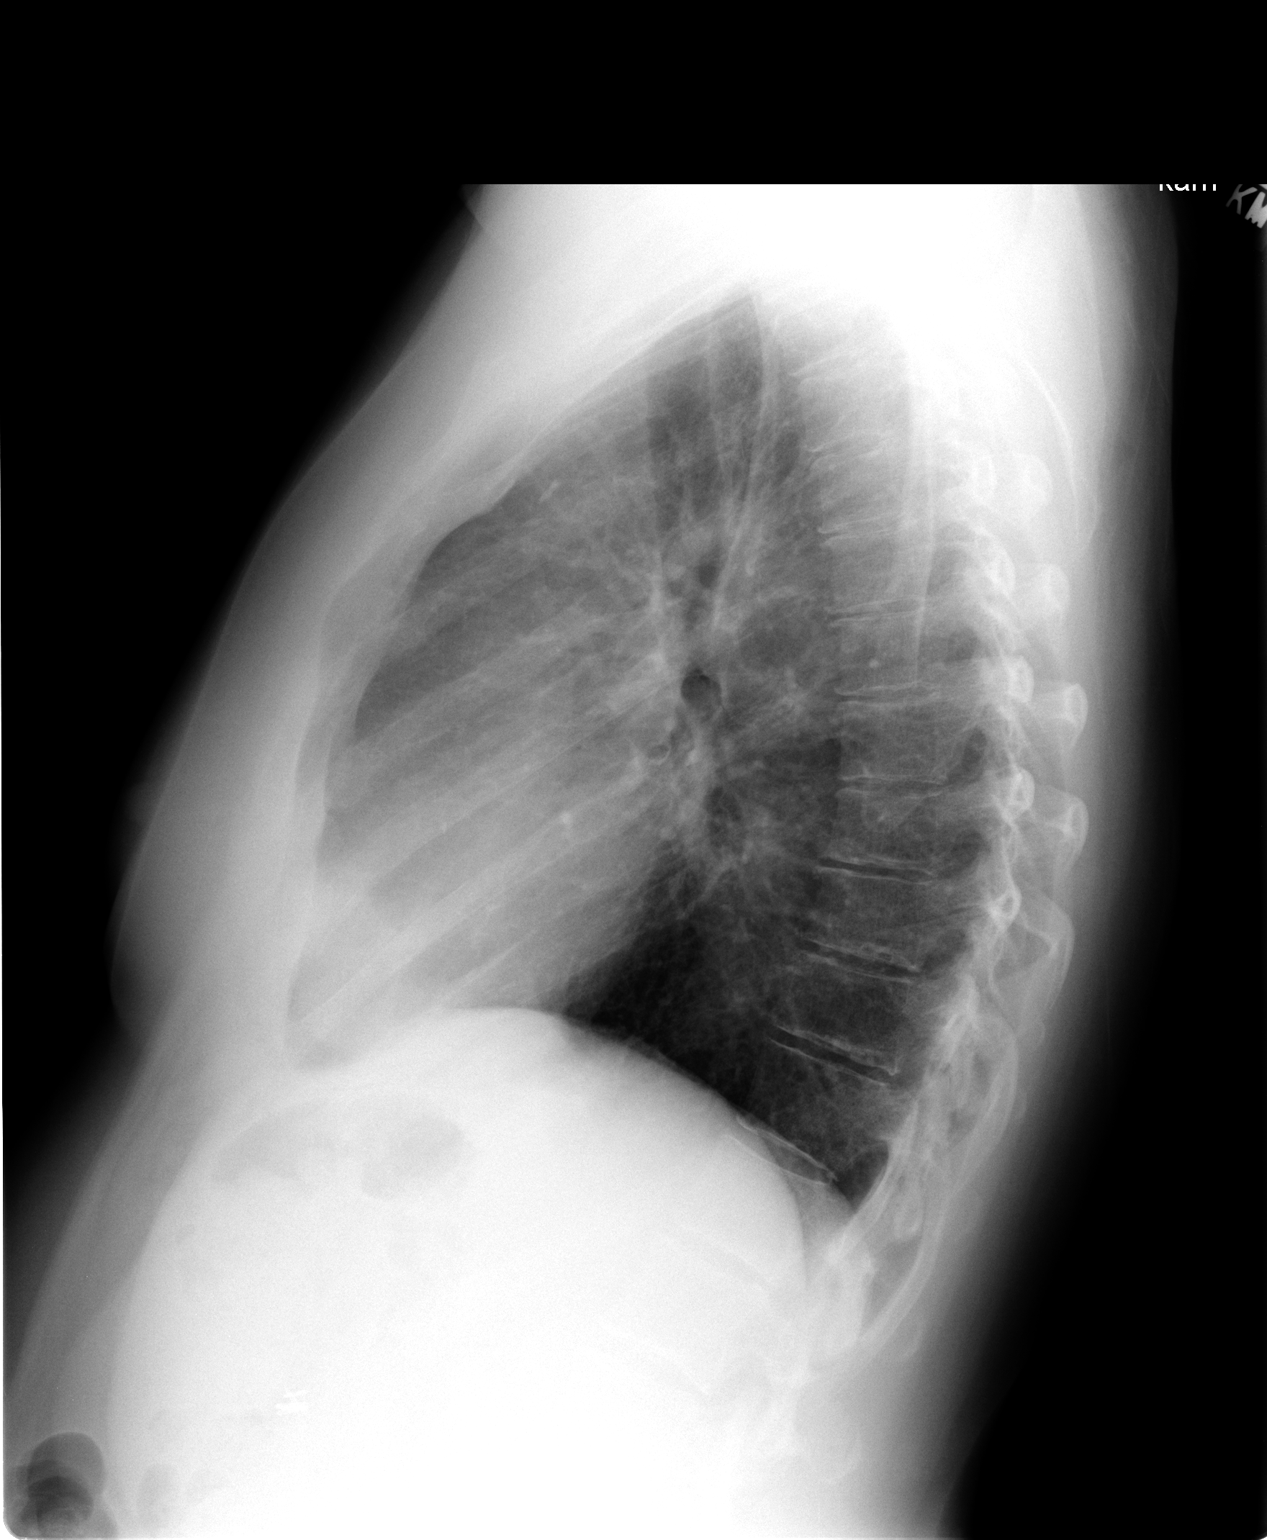

[2 of 2 positions shown; findings below may reference images not displayed]

FINDINGS: Trachea is midline.  Heart size normal.  Minimal biapical
pleural thickening.  Lungs are otherwise clear.  No pleural fluid.
IMPRESSION: No acute findings.

## 2013-09-07 DIAGNOSIS — I2510 Atherosclerotic heart disease of native coronary artery without angina pectoris: Secondary | ICD-10-CM | POA: Diagnosis not present

## 2013-09-07 DIAGNOSIS — E669 Obesity, unspecified: Secondary | ICD-10-CM | POA: Diagnosis not present

## 2013-09-07 DIAGNOSIS — M961 Postlaminectomy syndrome, not elsewhere classified: Secondary | ICD-10-CM | POA: Diagnosis not present

## 2013-09-07 DIAGNOSIS — K5900 Constipation, unspecified: Secondary | ICD-10-CM | POA: Diagnosis not present

## 2013-09-07 DIAGNOSIS — J449 Chronic obstructive pulmonary disease, unspecified: Secondary | ICD-10-CM | POA: Diagnosis not present

## 2013-09-07 DIAGNOSIS — E785 Hyperlipidemia, unspecified: Secondary | ICD-10-CM | POA: Diagnosis not present

## 2013-09-07 DIAGNOSIS — I10 Essential (primary) hypertension: Secondary | ICD-10-CM | POA: Diagnosis not present

## 2013-09-07 DIAGNOSIS — K219 Gastro-esophageal reflux disease without esophagitis: Secondary | ICD-10-CM | POA: Diagnosis not present

## 2013-09-22 ENCOUNTER — Observation Stay (HOSPITAL_COMMUNITY)
Admission: EM | Admit: 2013-09-22 | Discharge: 2013-09-23 | Disposition: A | Discharge status: Home / Self Care | Payer: Medicare Other | Attending: Family Medicine | Admitting: Family Medicine

## 2013-09-22 ENCOUNTER — Encounter (HOSPITAL_COMMUNITY): Payer: Self-pay | Admitting: Emergency Medicine

## 2013-09-22 ENCOUNTER — Emergency Department (HOSPITAL_COMMUNITY): Payer: Medicare Other

## 2013-09-22 DIAGNOSIS — K219 Gastro-esophageal reflux disease without esophagitis: Secondary | ICD-10-CM | POA: Diagnosis present

## 2013-09-22 DIAGNOSIS — R071 Chest pain on breathing: Secondary | ICD-10-CM | POA: Diagnosis not present

## 2013-09-22 DIAGNOSIS — E785 Hyperlipidemia, unspecified: Secondary | ICD-10-CM | POA: Insufficient documentation

## 2013-09-22 DIAGNOSIS — I10 Essential (primary) hypertension: Secondary | ICD-10-CM | POA: Diagnosis not present

## 2013-09-22 DIAGNOSIS — R079 Chest pain, unspecified: Principal | ICD-10-CM | POA: Insufficient documentation

## 2013-09-22 DIAGNOSIS — F17200 Nicotine dependence, unspecified, uncomplicated: Secondary | ICD-10-CM | POA: Diagnosis not present

## 2013-09-22 DIAGNOSIS — Z90710 Acquired absence of both cervix and uterus: Secondary | ICD-10-CM | POA: Insufficient documentation

## 2013-09-22 DIAGNOSIS — R0602 Shortness of breath: Secondary | ICD-10-CM | POA: Insufficient documentation

## 2013-09-22 DIAGNOSIS — E039 Hypothyroidism, unspecified: Secondary | ICD-10-CM | POA: Insufficient documentation

## 2013-09-22 DIAGNOSIS — J449 Chronic obstructive pulmonary disease, unspecified: Secondary | ICD-10-CM | POA: Diagnosis not present

## 2013-09-22 DIAGNOSIS — I2510 Atherosclerotic heart disease of native coronary artery without angina pectoris: Secondary | ICD-10-CM | POA: Insufficient documentation

## 2013-09-22 DIAGNOSIS — R0600 Dyspnea, unspecified: Secondary | ICD-10-CM

## 2013-09-22 DIAGNOSIS — Z9089 Acquired absence of other organs: Secondary | ICD-10-CM | POA: Diagnosis not present

## 2013-09-22 DIAGNOSIS — J4489 Other specified chronic obstructive pulmonary disease: Secondary | ICD-10-CM | POA: Insufficient documentation

## 2013-09-22 DIAGNOSIS — Z79899 Other long term (current) drug therapy: Secondary | ICD-10-CM | POA: Diagnosis not present

## 2013-09-22 DIAGNOSIS — E119 Type 2 diabetes mellitus without complications: Secondary | ICD-10-CM | POA: Insufficient documentation

## 2013-09-22 DIAGNOSIS — Z720 Tobacco use: Secondary | ICD-10-CM

## 2013-09-22 DIAGNOSIS — I252 Old myocardial infarction: Secondary | ICD-10-CM | POA: Diagnosis not present

## 2013-09-22 LAB — BASIC METABOLIC PANEL
BUN: 17 mg/dL (ref 6–23)
CHLORIDE: 104 meq/L (ref 96–112)
CO2: 22 mEq/L (ref 19–32)
Calcium: 9.5 mg/dL (ref 8.4–10.5)
Creatinine, Ser: 0.7 mg/dL (ref 0.50–1.10)
Glucose, Bld: 92 mg/dL (ref 70–99)
Potassium: 3.8 mEq/L (ref 3.7–5.3)
Sodium: 140 mEq/L (ref 137–147)

## 2013-09-22 LAB — CBC
HEMATOCRIT: 39.5 % (ref 36.0–46.0)
Hemoglobin: 13.3 g/dL (ref 12.0–15.0)
MCH: 29.4 pg (ref 26.0–34.0)
MCHC: 33.7 g/dL (ref 30.0–36.0)
MCV: 87.4 fL (ref 78.0–100.0)
Platelets: 247 10*3/uL (ref 150–400)
RBC: 4.52 MIL/uL (ref 3.87–5.11)
RDW: 14.3 % (ref 11.5–15.5)
WBC: 9.6 10*3/uL (ref 4.0–10.5)

## 2013-09-22 LAB — TROPONIN I: Troponin I: <0.3 ng/mL (ref <0.30)

## 2013-09-22 MED ORDER — NITROGLYCERIN 0.4 MG SL SUBL
0.4000 mg | SUBLINGUAL_TABLET | SUBLINGUAL | Status: DC | PRN
  Filled 2013-09-22: qty 1

## 2013-09-22 MED ORDER — MORPHINE SULFATE 4 MG/ML IJ SOLN
4.0000 mg | Freq: Once | INTRAMUSCULAR | Status: AC
  Administered 2013-09-22: 4 mg via INTRAVENOUS
  Filled 2013-09-22: qty 1

## 2013-09-22 MED ORDER — GI COCKTAIL ~~LOC~~: 30.0000 mL | Freq: Once | ORAL | Status: DC

## 2013-09-22 NOTE — ED Notes (Signed)
Pt. REFUSED nitroGLYCERIN tab, claimed of having bad headache once med is taken.

## 2013-09-22 NOTE — ED Provider Notes (Signed)
CSN: [id number]     Arrival date & time 09/22/13  2128 History   First MD Initiated Contact with Patient 09/22/13 2243     No chief complaint on file.    (Consider location/radiation/quality/duration/timing/severity/associated sxs/prior Treatment) HPI Katrina [name] is a 53 y.o. female who presents to emergency department complaining of chest pain. Patient has had intermittent chest pain for last 3 days. States it lasts anywhere from minutes to several hours. She reports history of coronary disease, several cardiac cath and most recently several stress tests. She states her care has been done out of state in here. She states she recently lived [place] but moved back to [place] not to long ago. States her last cardiac cath was approximately 4 years ago, showed some stenosis, states one vessel was 50% stenosed but she did not receive any stenting at that time. Patient states that she has had most recent stress test a year ago. She states was abnormal. She states she was told she needed a cardiac cath, but she never had it done. She states she didn't have it done because "cardiologist never scheduled it and then I moved here." Patient reports persistently elevated blood pressure as high as 329J systolic. She states she takes all of her blood pressure medications, but still unable to control it. Patient states she's not eating healthy and she's not exercising. States "exercising as not my thing." Patient denies any exertional chest pain. States it occurs at random. States it's on the left side, pressure-like, goes up to the neck and left shoulder. States associated shortness of breath and dizziness. States recently has had some swelling in her bilateral lower legs but none at present. Denies any other complaints.  Past Medical History  Diagnosis Date  . Diabetes mellitus   . Hypertension   . Hyperlipidemia   . COPD (chronic obstructive pulmonary disease)   . Reflux   . Myocardial infarction   . CAD  (coronary artery disease)   . Hypothyroid   . Lung nodules   . Diverticulosis   . Bipolar affective    Past Surgical History  Procedure Laterality Date  . Back surgery    . Abdominal hysterectomy    . Appendectomy    . Tonsillectomy    . Ankle surgery    . Cholecystectomy    . Nose surgery     Family History  Problem Relation Age of Onset  . Coronary artery disease Father   . Emphysema Father    History  Substance Use Topics  . Smoking status: Current Every Day Smoker -- 1.00 packs/day for 40 years    Types: Cigarettes  . Smokeless tobacco: Not on file  . Alcohol Use: No   OB History   Grav Para Term Preterm Abortions TAB SAB Ect Mult Living                 Review of Systems  Constitutional: Negative for fever and chills.  Respiratory: Positive for chest tightness and shortness of breath. Negative for cough.   Cardiovascular: Positive for chest pain and leg swelling. Negative for palpitations.  Gastrointestinal: Negative for nausea, vomiting, abdominal pain and diarrhea.  Genitourinary: Negative for dysuria, flank pain and pelvic pain.  Musculoskeletal: Negative for arthralgias, myalgias, neck pain and neck stiffness.  Skin: Negative for rash.  Neurological: Positive for dizziness. Negative for weakness and headaches.  All other systems reviewed and are negative.     Allergies  Penicillins  Home Medications   Prior to Admission  medications   Medication Sig Start Date End Date Taking? Authorizing Provider  albuterol (PROVENTIL HFA;VENTOLIN HFA) 108 (90 BASE) MCG/ACT inhaler Inhale 2 puffs into the lungs every 6 (six) hours as needed. wheezing   Yes Historical Provider, MD  baclofen (LIORESAL) 10 MG tablet Take 10 mg by mouth 3 (three) times daily.   Yes Historical Provider, MD  carisoprodol (SOMA) 350 MG tablet Take 350 mg by mouth 3 (three) times daily.   Yes Historical Provider, MD  cloNIDine (CATAPRES - DOSED IN MG/24 HR) 0.3 mg/24hr patch Place 0.3 mg onto  the skin once a week. Sundays   Yes Historical Provider, MD  dexlansoprazole (DEXILANT) 60 MG capsule Take 60 mg by mouth every evening.   Yes Historical Provider, MD  diphenhydrAMINE (BENADRYL) 12.5 MG/5ML elixir Take 25 mg by mouth 4 (four) times daily as needed. Takes as needed with epi-pen for food allergies   Yes Historical Provider, MD  EPINEPHrine (EPI-PEN) 0.3 mg/0.3 mL DEVI Inject 0.3 mg into the muscle once.   Yes Historical Provider, MD  gabapentin (NEURONTIN) 600 MG tablet Take 600 mg by mouth 3 (three) times daily.   Yes Historical Provider, MD  hydrALAZINE (APRESOLINE) 100 MG tablet Take 100 mg by mouth 2 (two) times daily.   Yes Historical Provider, MD  hydrochlorothiazide (HYDRODIURIL) 25 MG tablet Take 25 mg by mouth 2 (two) times daily.   Yes Historical Provider, MD  isosorbide mononitrate (IMDUR) 60 MG 24 hr tablet Take 60 mg by mouth daily.   Yes Historical Provider, MD  labetalol (NORMODYNE) 300 MG tablet Take 300 mg by mouth 2 (two) times daily.   Yes Historical Provider, MD  levothyroxine (SYNTHROID, LEVOTHROID) 50 MCG tablet Take 25 mcg by mouth daily before breakfast.   Yes Historical Provider, MD  metFORMIN (GLUCOPHAGE) 500 MG tablet Take 500 mg by mouth 2 (two) times daily with a meal.   Yes Historical Provider, MD  montelukast (SINGULAIR) 10 MG tablet Take 10 mg by mouth at bedtime.   Yes Historical Provider, MD  oxyCODONE-acetaminophen (PERCOCET/ROXICET) 5-325 MG per tablet Take 1 tablet by mouth every 4 (four) hours as needed for severe pain (for pain).   Yes Historical Provider, MD  rOPINIRole (REQUIP) 3 MG tablet Take 3 mg by mouth at bedtime.   Yes Historical Provider, MD  triamterene-hydrochlorothiazide (DYAZIDE) 37.5-25 MG per capsule Take 1 capsule by mouth daily.   Yes Historical Provider, MD  zolpidem (AMBIEN) 10 MG tablet Take 10 mg by mouth at bedtime.   Yes Historical Provider, MD   BP 194/96  Pulse 77  Temp(Src) 98.5 F (36.9 C) (Oral)  Resp 24  SpO2  100% Physical Exam  Nursing note and vitals reviewed. Constitutional: She appears well-developed and well-nourished. No distress.  HENT:  Head: Normocephalic.  Eyes: Conjunctivae are normal.  Neck: Neck supple.  Cardiovascular: Normal rate, regular rhythm and normal heart sounds.   Pulmonary/Chest: Effort normal and breath sounds normal. No respiratory distress. She has no wheezes. She has no rales. She exhibits no tenderness.  Abdominal: Soft. Bowel sounds are normal. She exhibits no distension. There is no tenderness. There is no rebound.  Musculoskeletal: She exhibits no edema.  Neurological: She is alert.  Skin: Skin is warm and dry.  Psychiatric: She has a normal mood and affect. Her behavior is normal.    ED Course  Procedures (including critical care time) Labs Review Labs Reviewed  PRO B NATRIURETIC PEPTIDE - Abnormal; Notable for the following:  Pro B Natriuretic peptide (BNP) 259.2 (*)    All other components within normal limits  CBC  BASIC METABOLIC PANEL  TROPONIN I    Imaging Review Dg Chest Port 1 View  09/22/2013   CLINICAL DATA:  Chest pain and shortness of breath for 2 days.  EXAM: PORTABLE CHEST - 1 VIEW  COMPARISON:  04/02/2012  FINDINGS: The heart size and mediastinal contours are within normal limits. Both lungs are clear. The visualized skeletal structures are unremarkable.  IMPRESSION: No active disease.   Electronically Signed   By: Lucienne Capers M.D.   On: 09/22/2013 22:42     EKG Interpretation   Date/Time:  Saturday September 22 2013 21:44:30 EDT Ventricular Rate:  82 PR Interval:  164 QRS Duration: 91 QT Interval:  390 QTC Calculation: 455 R Axis:   39 Text Interpretation:  Sinus rhythm Confirmed by Kathrynn Humble, MD, Thelma Comp  [phone]) on 09/22/2013 11:57:46 PM      MDM   Final diagnoses:  Chest pain    Patient continues to have chest pain, states it started around 8 PM. She's refusing aspirin because "I have a hole in the stomach" and is  refusing nitroglycerin because "this can make the headache worse."  Pt did accept an aspirin. Given her CAD history, will need to be admitted for rule out.    12:56 AM Spoke with cardiology, Lurena Joiner, will consult in AM Spoke with [facility], will admit.   Filed Vitals:   09/22/13 2147 09/22/13 2350 09/23/13 0006  BP: 194/96  198/80  Pulse: 77 72   Temp: 98.5 F (36.9 C)    TempSrc: Oral    Resp: 24 17   SpO2: 100% 98%      Suly Vukelich A Deyonna Fitzsimmons, PA-C 09/23/13 2345

## 2013-09-22 NOTE — ED Notes (Signed)
Came in with complaint of chest pain which started 3 days ago got worst this evening, pt also reported of SOB and dizziness. Pt claimed of having cardiac problems in the past,  A smoker and has asthma.

## 2013-09-22 NOTE — ED Notes (Signed)
Pt. Can not tolerated ASPIRIN as claimed, makes stomach upset.

## 2013-09-23 ENCOUNTER — Encounter (HOSPITAL_COMMUNITY): Payer: Self-pay | Admitting: Internal Medicine

## 2013-09-23 ENCOUNTER — Ambulatory Visit (HOSPITAL_COMMUNITY)
Admit: 2013-09-23 | Discharge: 2013-09-23 | Disposition: A | Discharge status: Home / Self Care | Payer: Medicare Other | Attending: Cardiology | Admitting: Cardiology

## 2013-09-23 DIAGNOSIS — E119 Type 2 diabetes mellitus without complications: Secondary | ICD-10-CM | POA: Diagnosis present

## 2013-09-23 DIAGNOSIS — R079 Chest pain, unspecified: Secondary | ICD-10-CM

## 2013-09-23 LAB — PRO B NATRIURETIC PEPTIDE: Pro B Natriuretic peptide (BNP): 259.2 pg/mL — ABNORMAL HIGH (ref 0–125)

## 2013-09-23 LAB — TROPONIN I: Troponin I: <0.3 ng/mL (ref <0.30)

## 2013-09-23 LAB — GLUCOSE, CAPILLARY
GLUCOSE-CAPILLARY: 128 mg/dL — AB (ref 70–99)
GLUCOSE-CAPILLARY: 103 mg/dL — AB (ref 70–99)

## 2013-09-23 MED ORDER — TECHNETIUM TC 99M SESTAMIBI - CARDIOLITE
30.0000 | Freq: Once | INTRAVENOUS | Status: AC | PRN
  Administered 2013-09-23: 30 via INTRAVENOUS

## 2013-09-23 MED ORDER — REGADENOSON 0.4 MG/5ML IV SOLN
0.4000 mg | Freq: Once | INTRAVENOUS | Status: AC
  Administered 2013-09-23: 0.4 mg via INTRAVENOUS

## 2013-09-23 MED ORDER — ALBUTEROL SULFATE (2.5 MG/3ML) 0.083% IN NEBU: 2.5000 mg | INHALATION_SOLUTION | Freq: Four times a day (QID) | RESPIRATORY_TRACT | Status: DC | PRN

## 2013-09-23 MED ORDER — ISOSORBIDE MONONITRATE ER 60 MG PO TB24
60.0000 mg | ORAL_TABLET | Freq: Every day | ORAL | Status: DC
  Administered 2013-09-23: 60 mg via ORAL
  Filled 2013-09-23: qty 1

## 2013-09-23 MED ORDER — TRIAMTERENE-HCTZ 37.5-25 MG PO TABS
1.0000 | ORAL_TABLET | Freq: Every day | ORAL | Status: DC
  Administered 2013-09-23: 1 via ORAL
  Filled 2013-09-23: qty 1

## 2013-09-23 MED ORDER — MORPHINE SULFATE 4 MG/ML IJ SOLN
4.0000 mg | Freq: Once | INTRAMUSCULAR | Status: DC
  Filled 2013-09-23: qty 1

## 2013-09-23 MED ORDER — MORPHINE SULFATE 4 MG/ML IJ SOLN: 4.0000 mg | INTRAMUSCULAR | Status: DC | PRN

## 2013-09-23 MED ORDER — ZOLPIDEM TARTRATE 10 MG PO TABS: 10.0000 mg | ORAL_TABLET | Freq: Every day | ORAL | Status: DC

## 2013-09-23 MED ORDER — HYDRALAZINE HCL 50 MG PO TABS
100.0000 mg | ORAL_TABLET | Freq: Two times a day (BID) | ORAL | Status: DC
  Administered 2013-09-23 (×2): 100 mg via ORAL
  Filled 2013-09-23 (×3): qty 2

## 2013-09-23 MED ORDER — HYDROCHLOROTHIAZIDE 25 MG PO TABS
25.0000 mg | ORAL_TABLET | Freq: Two times a day (BID) | ORAL | Status: DC
  Administered 2013-09-23: 25 mg via ORAL
  Filled 2013-09-23 (×3): qty 1

## 2013-09-23 MED ORDER — PANTOPRAZOLE SODIUM 40 MG PO TBEC
40.0000 mg | DELAYED_RELEASE_TABLET | Freq: Every day | ORAL | Status: DC
  Administered 2013-09-23: 40 mg via ORAL
  Filled 2013-09-23: qty 1

## 2013-09-23 MED ORDER — INSULIN ASPART 100 UNIT/ML ~~LOC~~ SOLN
0.0000 [IU] | SUBCUTANEOUS | Status: DC
  Administered 2013-09-23: 1 [IU] via SUBCUTANEOUS

## 2013-09-23 MED ORDER — ROPINIROLE HCL 1 MG PO TABS
3.0000 mg | ORAL_TABLET | Freq: Every day | ORAL | Status: DC
  Administered 2013-09-23: 3 mg via ORAL
  Filled 2013-09-23 (×2): qty 3

## 2013-09-23 MED ORDER — REGADENOSON 0.4 MG/5ML IV SOLN
INTRAVENOUS | Status: AC
  Administered 2013-09-23: 0.4 mg via INTRAVENOUS
  Filled 2013-09-23: qty 5

## 2013-09-23 MED ORDER — ACETAMINOPHEN 325 MG PO TABS
650.0000 mg | ORAL_TABLET | ORAL | Status: DC | PRN
  Administered 2013-09-23: 650 mg via ORAL
  Filled 2013-09-23: qty 2

## 2013-09-23 MED ORDER — ONDANSETRON HCL 4 MG/2ML IJ SOLN
4.0000 mg | Freq: Four times a day (QID) | INTRAMUSCULAR | Status: DC | PRN
  Filled 2013-09-23: qty 2

## 2013-09-23 MED ORDER — GABAPENTIN 300 MG PO CAPS
600.0000 mg | ORAL_CAPSULE | Freq: Three times a day (TID) | ORAL | Status: DC
  Administered 2013-09-23: 600 mg via ORAL
  Filled 2013-09-23 (×3): qty 2

## 2013-09-23 MED ORDER — NITROGLYCERIN 2 % TD OINT
1.0000 [in_us] | TOPICAL_OINTMENT | Freq: Four times a day (QID) | TRANSDERMAL | Status: DC
  Filled 2013-09-23: qty 30

## 2013-09-23 MED ORDER — ALBUTEROL SULFATE HFA 108 (90 BASE) MCG/ACT IN AERS: 2.0000 | INHALATION_SPRAY | Freq: Four times a day (QID) | RESPIRATORY_TRACT | Status: DC | PRN

## 2013-09-23 MED ORDER — GI COCKTAIL ~~LOC~~
30.0000 mL | Freq: Three times a day (TID) | ORAL | Status: DC | PRN
  Administered 2013-09-23: 30 mL via ORAL
  Filled 2013-09-23 (×2): qty 30

## 2013-09-23 MED ORDER — TECHNETIUM TC 99M SESTAMIBI GENERIC - CARDIOLITE
10.0000 | Freq: Once | INTRAVENOUS | Status: AC | PRN
  Administered 2013-09-23: 10 via INTRAVENOUS

## 2013-09-23 MED ORDER — LABETALOL HCL 300 MG PO TABS
300.0000 mg | ORAL_TABLET | Freq: Two times a day (BID) | ORAL | Status: DC
  Administered 2013-09-23: 300 mg via ORAL
  Filled 2013-09-23 (×3): qty 1

## 2013-09-23 MED ORDER — MONTELUKAST SODIUM 10 MG PO TABS
10.0000 mg | ORAL_TABLET | Freq: Every day | ORAL | Status: DC
  Administered 2013-09-23: 10 mg via ORAL
  Filled 2013-09-23 (×2): qty 1

## 2013-09-23 MED ORDER — ACETAMINOPHEN 325 MG PO TABS: 650.0000 mg | ORAL_TABLET | Freq: Four times a day (QID) | ORAL | Status: DC | PRN

## 2013-09-23 MED ORDER — LEVOTHYROXINE SODIUM 25 MCG PO TABS
25.0000 ug | ORAL_TABLET | Freq: Every day | ORAL | Status: DC
  Administered 2013-09-23: 25 ug via ORAL
  Filled 2013-09-23 (×2): qty 1

## 2013-09-23 MED ORDER — OXYCODONE-ACETAMINOPHEN 5-325 MG PO TABS: 1.0000 | ORAL_TABLET | ORAL | Status: DC | PRN

## 2013-09-23 MED ORDER — BACLOFEN 10 MG PO TABS
10.0000 mg | ORAL_TABLET | Freq: Three times a day (TID) | ORAL | Status: DC
  Filled 2013-09-23 (×3): qty 1

## 2013-09-23 MED ORDER — HEPARIN SODIUM (PORCINE) 5000 UNIT/ML IJ SOLN
5000.0000 [IU] | Freq: Three times a day (TID) | INTRAMUSCULAR | Status: DC
  Administered 2013-09-23: 5000 [IU] via SUBCUTANEOUS
  Filled 2013-09-23 (×5): qty 1

## 2013-09-23 MED ORDER — ASPIRIN 81 MG PO CHEW
324.0000 mg | CHEWABLE_TABLET | Freq: Once | ORAL | Status: AC
  Administered 2013-09-23: 324 mg via ORAL
  Filled 2013-09-23: qty 4

## 2013-09-23 MED ORDER — CLONIDINE HCL 0.3 MG/24HR TD PTWK
0.3000 mg | MEDICATED_PATCH | TRANSDERMAL | Status: DC
  Administered 2013-09-23: 0.3 mg via TRANSDERMAL
  Filled 2013-09-23: qty 1

## 2013-09-23 NOTE — Discharge Summary (Signed)
Physician Discharge Summary  Katrina [name] [id number] DOB: 09/25/1960 DOA: 09/22/2013  PCP: Vicenta Aly, FNP  Admit date: 09/22/2013 Discharge date: 09/23/2013  Time spent: > 35 minutes  Recommendations for Outpatient Follow-up:  1. Pt to follow up with pcp in 1-2 weeks or sooner should any new concerns arise 2. 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  Hospital Course:  Principal Problem:   Chest pain - Cardiac workup negative which included 3 sets of troponins drawn 6 hours apart and Myoview stress test - Patient reports she was taking her reflux medication at night I suspect this may be related to reflux and have recommended that she take her medication 30 minutes prior to the first meal of the day. - Recommend patient continue followup with her primary care physician - Chest x-ray was negative for acute disease  Active Problems:   GERD (gastroesophageal reflux disease) - I do not suspect this is well-controlled please see recommendations listed above    HTN (hypertension) - Patient to continue home regimen. - Should followup with PCP     DM2 (diabetes mellitus, type 2) - We'll continue home regimen on discharge - Recommend diabetic diet and routine CBG monitoring  Procedures:  Myoview  Consultations:  Cardiology  Discharge Exam: Filed Vitals:   09/23/13 1300  BP: 153/74  Pulse:   Temp: 98 F (36.7 C)  Resp: 18    General: Patient in no acute distress, alert  and awake Cardiovascular: Regular rate and rhythm, no murmurs rubs Respiratory: No audible wheezes, no increased work of breathing, breathing comfortably on room air  Discharge Instructions You were cared for by a hospitalist during your hospital stay. If you have any questions about your discharge medications or the care you received while you were in the hospital after you are discharged, you can call the unit and asked to speak with the hospitalist on call if the hospitalist that took care of you is not available. Once you are discharged, your primary care physician will handle any further medical issues. Please note that NO REFILLS for any discharge medications will be authorized once you are discharged, as it is imperative that you return to your primary care physician (or establish a relationship with a primary care physician if you do not have one) for your aftercare needs so that they can reassess your need for medications and monitor your lab values.  Discharge Orders   Future Orders Complete By Expires   Call MD for:  difficulty breathing, headache or visual disturbances  As directed    Call MD for:  severe uncontrolled pain  As directed    Call MD for:  temperature >100.4  As directed    Diet - low sodium heart healthy  As directed    Discharge instructions  As directed    Increase activity slowly  As directed        Medication List         albuterol 108 (90 BASE) MCG/ACT inhaler  Commonly known as:  PROVENTIL HFA;VENTOLIN  HFA  Inhale 2 puffs into the lungs every 6 (six) hours as needed. wheezing     baclofen 10 MG tablet  Commonly known as:  LIORESAL  Take 10 mg by mouth 3 (three) times daily.     carisoprodol 350 MG tablet  Commonly known as:  SOMA  Take 350 mg by mouth 3 (three) times daily.     cloNIDine 0.3 mg/24hr patch  Commonly known as:  CATAPRES - Dosed in mg/24 hr  Place 0.3 mg onto the skin once a week. Sundays     DEXILANT 60 MG capsule  Generic drug:   dexlansoprazole  Take 60 mg by mouth every evening.     diphenhydrAMINE 12.5 MG/5ML elixir  Commonly known as:  BENADRYL  Take 25 mg by mouth 4 (four) times daily as needed. Takes as needed with epi-pen for food allergies     EPINEPHrine 0.3 mg/0.3 mL Devi  Commonly known as:  EPI-PEN  Inject 0.3 mg into the muscle once.     gabapentin 600 MG tablet  Commonly known as:  NEURONTIN  Take 600 mg by mouth 3 (three) times daily.     hydrALAZINE 100 MG tablet  Commonly known as:  APRESOLINE  Take 100 mg by mouth 2 (two) times daily.     hydrochlorothiazide 25 MG tablet  Commonly known as:  HYDRODIURIL  Take 25 mg by mouth 2 (two) times daily.     isosorbide mononitrate 60 MG 24 hr tablet  Commonly known as:  IMDUR  Take 60 mg by mouth daily.     labetalol 300 MG tablet  Commonly known as:  NORMODYNE  Take 300 mg by mouth 2 (two) times daily.     levothyroxine 50 MCG tablet  Commonly known as:  SYNTHROID, LEVOTHROID  Take 25 mcg by mouth daily before breakfast.     metFORMIN 500 MG tablet  Commonly known as:  GLUCOPHAGE  Take 500 mg by mouth 2 (two) times daily with a meal.     montelukast 10 MG tablet  Commonly known as:  SINGULAIR  Take 10 mg by mouth at bedtime.     oxyCODONE-acetaminophen 5-325 MG per tablet  Commonly known as:  PERCOCET/ROXICET  Take 1 tablet by mouth every 4 (four) hours as needed for severe pain (for pain).     rOPINIRole 3 MG tablet  Commonly known as:  REQUIP  Take 3 mg by mouth at bedtime.     triamterene-hydrochlorothiazide 37.5-25 MG per capsule  Commonly known as:  DYAZIDE  Take 1 capsule by mouth daily.     zolpidem 10 MG tablet  Commonly known as:  AMBIEN  Take 10 mg by mouth at bedtime.       Allergies  Allergen Reactions  . Penicillins Anaphylaxis      The results of significant diagnostics from this hospitalization (including imaging, microbiology, ancillary and laboratory) are listed below for reference.    Significant  Diagnostic Studies: Nm Myocar Multi W/spect W/wall Motion / Ef  09/23/2013   CLINICAL DATA:  53 year old female with chest pain  EXAM: MYOCARDIAL IMAGING WITH SPECT (REST AND PHARMACOLOGIC-STRESS)  GATED LEFT VENTRICULAR WALL MOTION STUDY  LEFT VENTRICULAR EJECTION FRACTION  TECHNIQUE: Standard myocardial SPECT imaging was performed after resting intravenous injection of 10 mCi Tc-55m sestamibi. Subsequently, intravenous infusion of Lexiscan was performed under the supervision of the Cardiology staff. At peak effect of the drug, 30 mCi Tc-29m sestamibi was injected intravenously and standard myocardial SPECT imaging was performed.  Quantitative gated imaging was also performed to evaluate left ventricular wall motion, and estimate left ventricular ejection fraction.  COMPARISON:  Chest x-ray 09/22/2013; prior nuclear medicine cardiac stress test 11/24/2011  FINDINGS: Evaluation of the cardiac and gated data demonstrates and end diastolic volume of 74 mL and an end systolic volume of 22 mL yielding a calculated ejection fraction of 71%. Ventricular motion is normal. No evidence of global or focal motion abnormality.  Evaluation of the static resting and post pharmacological stress images demonstrates symmetric radiotracer uptake throughout the ventricular myocardium. No evidence of fixed or inducible/ reversible ischemia. No transient ischemic dilatation or other abnormality.  IMPRESSION: 1. No evidence of inducible/reversible ischemia. 2. Normal cardiac wall motion. 3. Ejection fraction 71%.   Electronically Signed   By: Jacqulynn Cadet M.D.   On: 09/23/2013 12:21   Dg Chest Port 1 View  09/22/2013   CLINICAL DATA:  Chest pain and shortness of breath for 2 days.  EXAM: PORTABLE CHEST - 1 VIEW  COMPARISON:  04/02/2012  FINDINGS: The heart size and mediastinal contours are within normal limits. Both lungs are clear. The visualized skeletal structures are unremarkable.  IMPRESSION: No active disease.    Electronically Signed   By: Lucienne Capers M.D.   On: 09/22/2013 22:42    Microbiology: No results found for this or any previous visit (from the past 240 hour(s)).   Labs: Basic Metabolic Panel:  Recent Labs Lab 09/22/13 2200  NA 140  K 3.8  CL 104  CO2 22  GLUCOSE 92  BUN 17  CREATININE 0.70  CALCIUM 9.5   Liver Function Tests: No results found for this basename: AST, ALT, ALKPHOS, BILITOT, PROT, ALBUMIN,  in the last 168 hours No results found for this basename: LIPASE, AMYLASE,  in the last 168 hours No results found for this basename: AMMONIA,  in the last 168 hours CBC:  Recent Labs Lab 09/22/13 2200  WBC 9.6  HGB 13.3  HCT 39.5  MCV 87.4  PLT 247   Cardiac Enzymes:  Recent Labs Lab 09/22/13 2200 09/23/13 0355 09/23/13 0716  TROPONINI <0.30 <0.30 <0.30   BNP: BNP (last 3 results)  Recent Labs  09/22/13 2200  PROBNP 259.2*   CBG:  Recent Labs Lab 09/23/13 0608 09/23/13 0835  GLUCAP 128* 103*       Signed:  Velvet Bathe  [facility] 09/23/2013, 2:56 PM

## 2013-09-23 NOTE — Progress Notes (Signed)
Patient Name: Katrina [name] Date of Encounter: 09/23/2013     Principal Problem:   Chest pain Active Problems:   GERD (gastroesophageal reflux disease)   HTN (hypertension)   DM2 (diabetes mellitus, type 2)    SUBJECTIVE  Middle-aged man on admitted with recent intermittent chest pain.  States she has had an abnormal stress test in the past and that cardiac catheterization was recommended but never done.  She has recently moved back here from [place].  Review of Epic reveals that she did have a normal Lexa scan Myoview stress test on 11/24/11 with no ischemia and with ejection fraction 66%. The patient was admitted during the night last night.  Cardiology was consulted but no note on chart yet. Her troponins are negative x3.  Her electrocardiogram is normal.  CURRENT MEDS . baclofen  10 mg Oral TID  . cloNIDine  0.3 mg Transdermal Weekly  . gabapentin  600 mg Oral TID  . heparin  5,000 Units Subcutaneous 3 times per day  . hydrALAZINE  100 mg Oral BID  . hydrochlorothiazide  25 mg Oral BID  . insulin aspart  0-9 Units Subcutaneous 6 times per day  . isosorbide mononitrate  60 mg Oral Daily  . labetalol  300 mg Oral BID  . levothyroxine  25 mcg Oral QAC breakfast  . montelukast  10 mg Oral QHS  . morphine  4 mg Intravenous Once  . nitroGLYCERIN  1 inch Topical 4 times per day  . pantoprazole  40 mg Oral Daily  . rOPINIRole  3 mg Oral QHS  . triamterene-hydrochlorothiazide  1 tablet Oral Daily    OBJECTIVE  Filed Vitals:   09/23/13 0100 09/23/13 0130 09/23/13 0300 09/23/13 0611  BP:   124/93 131/63  Pulse: 72 64 77 71  Temp:   97.9 F (36.6 C) 97.9 F (36.6 C)  TempSrc:   Oral Oral  Resp: 24 25 16 18   Height:   5\' 7"  (1.702 m)   Weight:   216 lb 4.8 oz (98.113 kg)   SpO2: 99% 97% 97% 95%   No intake or output data in the 24 hours ending 09/23/13 0900 Filed Weights   09/23/13 0300  Weight: 216 lb 4.8 oz (98.113 kg)    PHYSICAL EXAM  General: Pleasant,  NAD. Neuro: Alert and oriented X 3. Moves all extremities spontaneously. Psych: Normal affect. HEENT:  Normal  Neck: Supple without bruits or JVD. Lungs:  Resp regular and unlabored, CTA. Heart: RRR no s3, s4, or murmurs. Abdomen: Soft, non-tender, non-distended, BS + x 4.  Extremities: No clubbing, cyanosis or edema. DP/PT/Radials 2+ and equal bilaterally.  Accessory Clinical Findings  CBC  Recent Labs  09/22/13 2200  WBC 9.6  HGB 13.3  HCT 39.5  MCV 87.4  PLT 295   Basic Metabolic Panel  Recent Labs  09/22/13 2200  NA 140  K 3.8  CL 104  CO2 22  GLUCOSE 92  BUN 17  CREATININE 0.70  CALCIUM 9.5   Liver Function Tests No results found for this basename: AST, ALT, ALKPHOS, BILITOT, PROT, ALBUMIN,  in the last 72 hours No results found for this basename: LIPASE, AMYLASE,  in the last 72 hours Cardiac Enzymes  Recent Labs  09/22/13 2200 09/23/13 0355 09/23/13 0716  TROPONINI <0.30 <0.30 <0.30   BNP No components found with this basename: POCBNP,  D-Dimer No results found for this basename: DDIMER,  in the last 72 hours Hemoglobin A1C No results found for  this basename: HGBA1C,  in the last 72 hours Fasting Lipid Panel No results found for this basename: CHOL, HDL, LDLCALC, TRIG, CHOLHDL, LDLDIRECT,  in the last 72 hours Thyroid Function Tests No results found for this basename: TSH, T4TOTAL, FREET3, T3FREE, THYROIDAB,  in the last 72 hours  TELE  Normal sinus rhythm  ECG  Within normal limits.  No ischemic changes.  Radiology/Studies  Dg Chest Port 1 View  09/22/2013   CLINICAL DATA:  Chest pain and shortness of breath for 2 days.  EXAM: PORTABLE CHEST - 1 VIEW  COMPARISON:  04/02/2012  FINDINGS: The heart size and mediastinal contours are within normal limits. Both lungs are clear. The visualized skeletal structures are unremarkable.  IMPRESSION: No active disease.   Electronically Signed   By: Lucienne Capers M.D.   On: 09/22/2013 22:42     ASSESSMENT AND PLAN  1. chest pain.  History of nonobstructive coronary disease on heart catheter in 2009.  History of normal Myoview in 2013.  History of abnormal stress test last year in [place] but never followed up with heart catheterization. 2. Hypertension 3. Hyperlipidemia 4. diabetes mellitus 5. COPD with ongoing cigarette usage 6. history of GERD/reflux  Plan: We will proceed with a Lexi scan Myoview stress test this morning.  Signed, Darlin Coco MD

## 2013-09-23 NOTE — Progress Notes (Signed)
Patient seen and evaluated earlier this am by my associate. Please refer to his H and P for details regarding assessment and plan.  Cardiology note reviewed and plans are for Myoview stress test this am.  Velvet Bathe  Troponins x 3 negative.

## 2013-09-23 NOTE — H&P (Signed)
[facility] History and Physical  Jahanna Raether EXH:[id number] DOB: [birth year] DOA: 09/22/2013  Referring physician: EDP PCP: Vicenta Aly, FNP   Chief Complaint: Chest pain   HPI: Katrina [name] is a 53 y.o. female h/o non-obstructive CAD on heart cath in 2009.  She presents to the ED with L sided chest pain, intermittent, for the last 3 days.  Lasts for mins to several hours.  She had a stress test last year in [place] which was abnormal but never followed up with a heart cath as she was instructed to she says.  She has an extensive history of difficult to manage HTN with BPs as high as 250s and often over 200 at baseline.  CP occurs at random, not exertional.  Does radiate to L shoulder and L neck.  Review of Systems: Systems reviewed.  As above, otherwise negative  Past Medical History  Diagnosis Date  . Diabetes mellitus   . Hypertension   . Hyperlipidemia   . COPD (chronic obstructive pulmonary disease)   . Reflux   . Myocardial infarction   . CAD (coronary artery disease)   . Hypothyroid   . Lung nodules   . Diverticulosis   . Bipolar affective    Past Surgical History  Procedure Laterality Date  . Back surgery    . Abdominal hysterectomy    . Appendectomy    . Tonsillectomy    . Ankle surgery    . Cholecystectomy    . Nose surgery     Social History:  reports that she has been smoking Cigarettes.  She has a 40 pack-year smoking history. She does not have any smokeless tobacco history on file. She reports that she does not drink alcohol or use illicit drugs.  Allergies  Allergen Reactions  . Penicillins Anaphylaxis    Family History  Problem Relation Age of Onset  . Coronary artery disease Father 81  . Emphysema Father      Prior to Admission medications   Medication Sig Start Date End Date Taking? Authorizing Provider  albuterol (PROVENTIL HFA;VENTOLIN HFA) 108 (90 BASE) MCG/ACT inhaler Inhale 2 puffs into the lungs every 6 (six) hours as needed.  wheezing   Yes Historical Provider, MD  baclofen (LIORESAL) 10 MG tablet Take 10 mg by mouth 3 (three) times daily.   Yes Historical Provider, MD  carisoprodol (SOMA) 350 MG tablet Take 350 mg by mouth 3 (three) times daily.   Yes Historical Provider, MD  cloNIDine (CATAPRES - DOSED IN MG/24 HR) 0.3 mg/24hr patch Place 0.3 mg onto the skin once a week. Sundays   Yes Historical Provider, MD  dexlansoprazole (DEXILANT) 60 MG capsule Take 60 mg by mouth every evening.   Yes Historical Provider, MD  diphenhydrAMINE (BENADRYL) 12.5 MG/5ML elixir Take 25 mg by mouth 4 (four) times daily as needed. Takes as needed with epi-pen for food allergies   Yes Historical Provider, MD  EPINEPHrine (EPI-PEN) 0.3 mg/0.3 mL DEVI Inject 0.3 mg into the muscle once.   Yes Historical Provider, MD  gabapentin (NEURONTIN) 600 MG tablet Take 600 mg by mouth 3 (three) times daily.   Yes Historical Provider, MD  hydrALAZINE (APRESOLINE) 100 MG tablet Take 100 mg by mouth 2 (two) times daily.   Yes Historical Provider, MD  hydrochlorothiazide (HYDRODIURIL) 25 MG tablet Take 25 mg by mouth 2 (two) times daily.   Yes Historical Provider, MD  isosorbide mononitrate (IMDUR) 60 MG 24 hr tablet Take 60 mg by mouth daily.   Yes Historical  Provider, MD  labetalol (NORMODYNE) 300 MG tablet Take 300 mg by mouth 2 (two) times daily.   Yes Historical Provider, MD  levothyroxine (SYNTHROID, LEVOTHROID) 50 MCG tablet Take 25 mcg by mouth daily before breakfast.   Yes Historical Provider, MD  metFORMIN (GLUCOPHAGE) 500 MG tablet Take 500 mg by mouth 2 (two) times daily with a meal.   Yes Historical Provider, MD  montelukast (SINGULAIR) 10 MG tablet Take 10 mg by mouth at bedtime.   Yes Historical Provider, MD  oxyCODONE-acetaminophen (PERCOCET/ROXICET) 5-325 MG per tablet Take 1 tablet by mouth every 4 (four) hours as needed for severe pain (for pain).   Yes Historical Provider, MD  rOPINIRole (REQUIP) 3 MG tablet Take 3 mg by mouth at  bedtime.   Yes Historical Provider, MD  triamterene-hydrochlorothiazide (DYAZIDE) 37.5-25 MG per capsule Take 1 capsule by mouth daily.   Yes Historical Provider, MD  zolpidem (AMBIEN) 10 MG tablet Take 10 mg by mouth at bedtime.   Yes Historical Provider, MD   Physical Exam: Filed Vitals:   09/23/13 0006  BP: 198/80  Pulse:   Temp:   Resp:     BP 198/80  Pulse 72  Temp(Src) 98.5 F (36.9 C) (Oral)  Resp 17  SpO2 98%  General Appearance:    Alert, oriented, patient uncomfortable, appears stated age  Head:    Normocephalic, atraumatic  Eyes:    PERRL, EOMI, sclera non-icteric        Nose:   Nares without drainage or epistaxis. Mucosa, turbinates normal  Throat:   Moist mucous membranes. Oropharynx without erythema or exudate.  Neck:   Supple. No carotid bruits.  No thyromegaly.  No lymphadenopathy.   Back:     No CVA tenderness, no spinal tenderness  Lungs:     Clear to auscultation bilaterally, without wheezes, rhonchi or rales  Chest wall:    No tenderness to palpitation  Heart:    Regular rate and rhythm without murmurs, gallops, rubs  Abdomen:     Soft, non-tender, nondistended, normal bowel sounds, no organomegaly  Genitalia:    deferred  Rectal:    deferred  Extremities:   No clubbing, cyanosis or edema.  Pulses:   2+ and symmetric all extremities  Skin:   Skin color, texture, turgor normal, no rashes or lesions  Lymph nodes:   Cervical, supraclavicular, and axillary nodes normal  Neurologic:   CNII-XII intact. Normal strength, sensation and reflexes      throughout    Labs on Admission:  Basic Metabolic Panel:  Recent Labs Lab 09/22/13 2200  NA 140  K 3.8  CL 104  CO2 22  GLUCOSE 92  BUN 17  CREATININE 0.70  CALCIUM 9.5   Liver Function Tests: No results found for this basename: AST, ALT, ALKPHOS, BILITOT, PROT, ALBUMIN,  in the last 168 hours No results found for this basename: LIPASE, AMYLASE,  in the last 168 hours No results found for this  basename: AMMONIA,  in the last 168 hours CBC:  Recent Labs Lab 09/22/13 2200  WBC 9.6  HGB 13.3  HCT 39.5  MCV 87.4  PLT 247   Cardiac Enzymes:  Recent Labs Lab 09/22/13 2200  TROPONINI <0.30    BNP (last 3 results)  Recent Labs  09/22/13 2200  PROBNP 259.2*   CBG: No results found for this basename: GLUCAP,  in the last 168 hours  Radiological Exams on Admission: Dg Chest Port 1 View  09/22/2013   CLINICAL DATA:  Chest pain and shortness of breath for 2 days.  EXAM: PORTABLE CHEST - 1 VIEW  COMPARISON:  04/02/2012  FINDINGS: The heart size and mediastinal contours are within normal limits. Both lungs are clear. The visualized skeletal structures are unremarkable.  IMPRESSION: No active disease.   Electronically Signed   By: Lucienne Capers M.D.   On: 09/22/2013 22:42    EKG: Independently reviewed.  Assessment/Plan Principal Problem:   Chest pain Active Problems:   GERD (gastroesophageal reflux disease)   HTN (hypertension)   DM2 (diabetes mellitus, type 2)   1. Chest pain - serial trops ordered, cards consulted, suspect they will want to order stress test in AM, have patient NPO for this.  Will try NTG to see if this helps the chest pain, treat her resulting headache (that she will get she states) with tylenol and morphine, treat her resulting GERD from the morphine (that she is already having in the ED) with gi cocktail. 2. HTN - continue home meds, additionally will put patient on NTG paste (also for chest pain) and see if this helps BP as well.  Unfortunately patient is already on max doses of several BP meds, and double the max dose of HCTZ actually (patient confirmed this to me when I specifically asked about this unusual med set up during interview) and BP is still in the 190s (which patient says is actually low for her), anticipate it will be difficult to manage as inpatient as well. 3. DM2 - hold metformin while NPO, low dose SSI q4h    Code Status:  Full Code  Family Communication: Husband at bedside Disposition Plan: Admit to inpatient   Time spent: 15 min  [facility] Pager [phone]  If 7AM-7PM, please contact the day team taking care of the patient [email or web address] Password [email or web address] 09/23/2013, 1:17 AM

## 2013-09-24 LAB — GLUCOSE, CAPILLARY: Glucose-Capillary: 131 mg/dL — ABNORMAL HIGH (ref 70–99)

## 2013-09-25 NOTE — ED Provider Notes (Signed)
Shared service with midlevel provider. I have personally seen and examined the patient, providing direct face to face care, presenting with the chief complaint of chest pain. Physical exam findings include normal cardiopulmonary exam. Plan will be to admit patient for her chest pain, as she has cardiac risk factors and allegedly moderate CAD per cath in [place]. I have reviewed the nursing documentation on past medical history, family history, and social history.   Varney Biles, MD 09/25/13 [phone]

## 2013-10-12 DIAGNOSIS — M545 Low back pain, unspecified: Secondary | ICD-10-CM | POA: Diagnosis not present

## 2013-10-12 DIAGNOSIS — I10 Essential (primary) hypertension: Secondary | ICD-10-CM | POA: Diagnosis not present

## 2013-10-12 DIAGNOSIS — F17200 Nicotine dependence, unspecified, uncomplicated: Secondary | ICD-10-CM | POA: Diagnosis not present

## 2013-10-12 DIAGNOSIS — E119 Type 2 diabetes mellitus without complications: Secondary | ICD-10-CM | POA: Diagnosis not present

## 2013-10-12 DIAGNOSIS — E785 Hyperlipidemia, unspecified: Secondary | ICD-10-CM | POA: Diagnosis not present

## 2013-10-18 DIAGNOSIS — M5137 Other intervertebral disc degeneration, lumbosacral region: Secondary | ICD-10-CM | POA: Diagnosis not present

## 2013-10-18 DIAGNOSIS — M47817 Spondylosis without myelopathy or radiculopathy, lumbosacral region: Secondary | ICD-10-CM | POA: Diagnosis not present

## 2013-10-18 DIAGNOSIS — M545 Low back pain, unspecified: Secondary | ICD-10-CM | POA: Diagnosis not present

## 2013-10-18 DIAGNOSIS — M461 Sacroiliitis, not elsewhere classified: Secondary | ICD-10-CM | POA: Diagnosis not present

## 2013-10-23 DIAGNOSIS — M7989 Other specified soft tissue disorders: Secondary | ICD-10-CM | POA: Diagnosis not present

## 2013-10-23 DIAGNOSIS — E7800 Pure hypercholesterolemia, unspecified: Secondary | ICD-10-CM | POA: Diagnosis not present

## 2013-10-23 DIAGNOSIS — I10 Essential (primary) hypertension: Secondary | ICD-10-CM | POA: Diagnosis not present

## 2013-10-23 DIAGNOSIS — F17200 Nicotine dependence, unspecified, uncomplicated: Secondary | ICD-10-CM | POA: Diagnosis not present

## 2013-10-23 DIAGNOSIS — I252 Old myocardial infarction: Secondary | ICD-10-CM | POA: Diagnosis not present

## 2013-10-23 DIAGNOSIS — R079 Chest pain, unspecified: Secondary | ICD-10-CM | POA: Diagnosis not present

## 2013-10-23 DIAGNOSIS — Z79899 Other long term (current) drug therapy: Secondary | ICD-10-CM | POA: Diagnosis not present

## 2013-10-23 DIAGNOSIS — E119 Type 2 diabetes mellitus without complications: Secondary | ICD-10-CM | POA: Diagnosis not present

## 2013-11-08 DIAGNOSIS — I2510 Atherosclerotic heart disease of native coronary artery without angina pectoris: Secondary | ICD-10-CM | POA: Diagnosis not present

## 2013-11-08 DIAGNOSIS — M545 Low back pain, unspecified: Secondary | ICD-10-CM | POA: Diagnosis not present

## 2013-11-08 DIAGNOSIS — J45909 Unspecified asthma, uncomplicated: Secondary | ICD-10-CM | POA: Diagnosis not present

## 2013-11-08 DIAGNOSIS — E119 Type 2 diabetes mellitus without complications: Secondary | ICD-10-CM | POA: Diagnosis not present

## 2013-11-08 DIAGNOSIS — I10 Essential (primary) hypertension: Secondary | ICD-10-CM | POA: Diagnosis not present

## 2013-11-08 DIAGNOSIS — R079 Chest pain, unspecified: Secondary | ICD-10-CM | POA: Diagnosis not present

## 2013-12-05 IMAGING — CR DG CHEST 2V
2 series · 2 of 2 positions shown · non-contrast
Comparison: PA and lateral chest 11/22/2011.

CLINICAL DATA: Neck pain radiating into the left arm.

CHEST - 2 VIEW

[w chest pa]
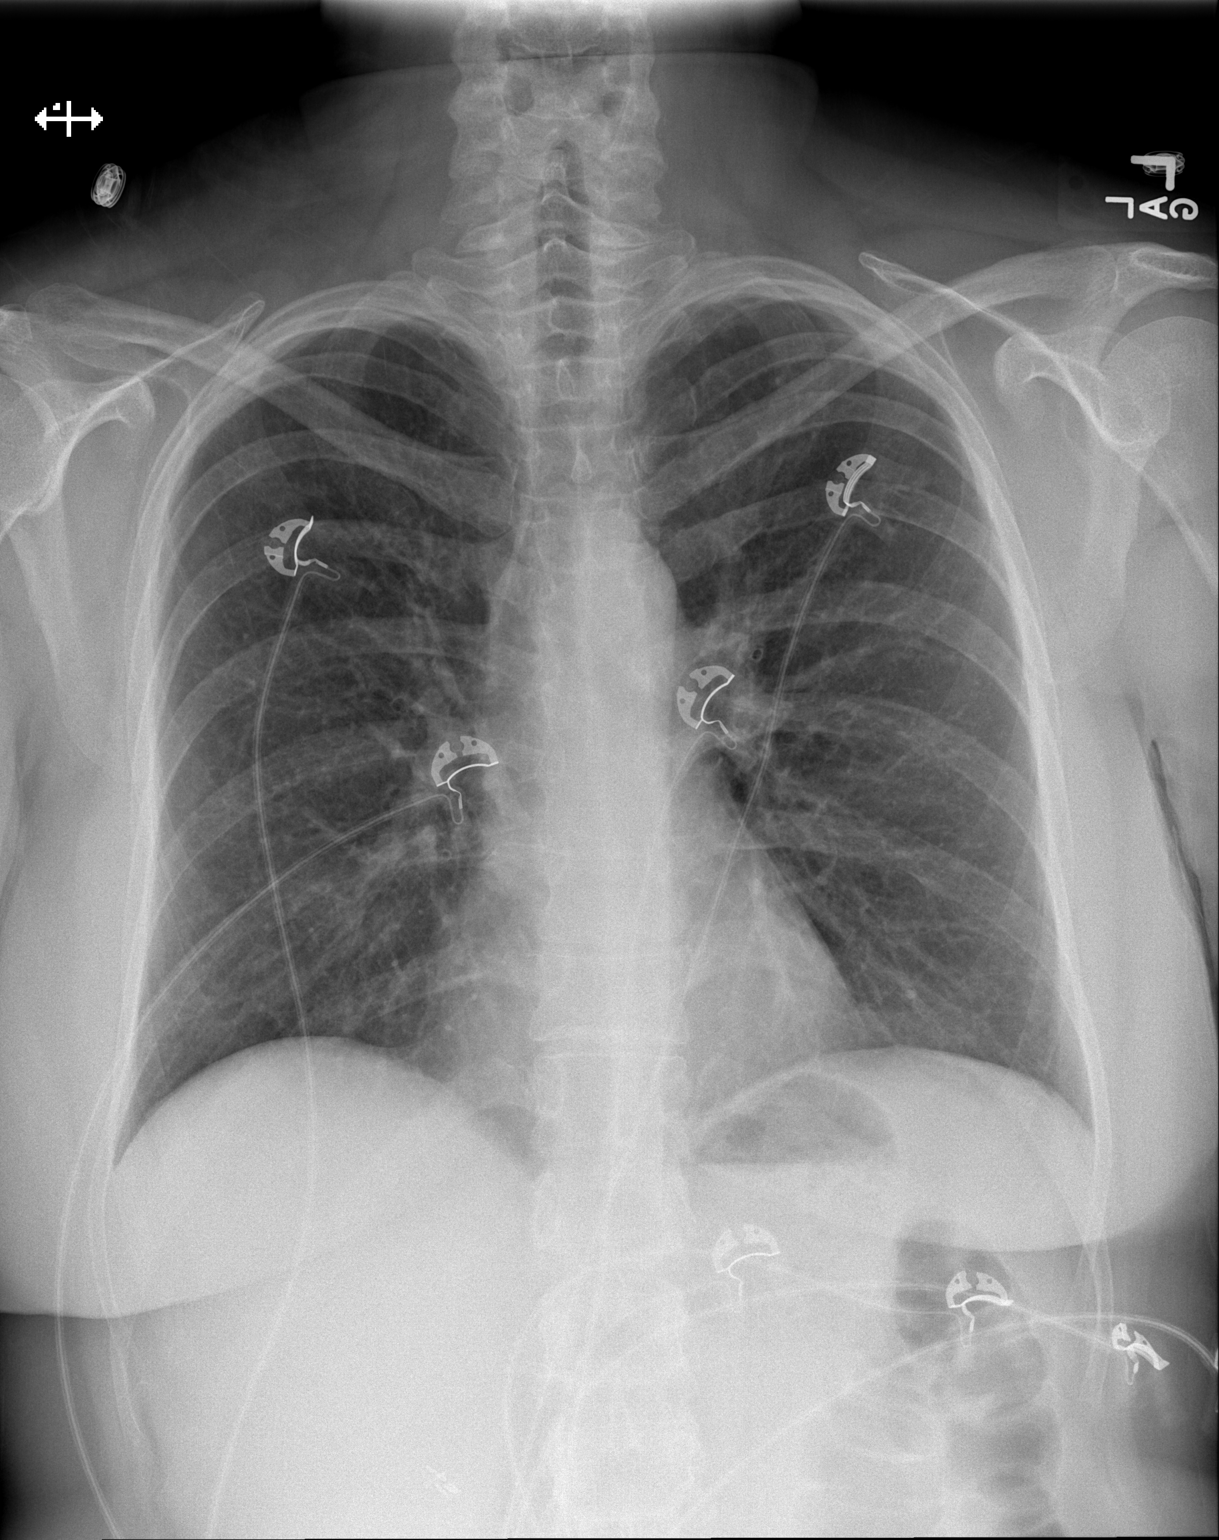

[w chest lat]
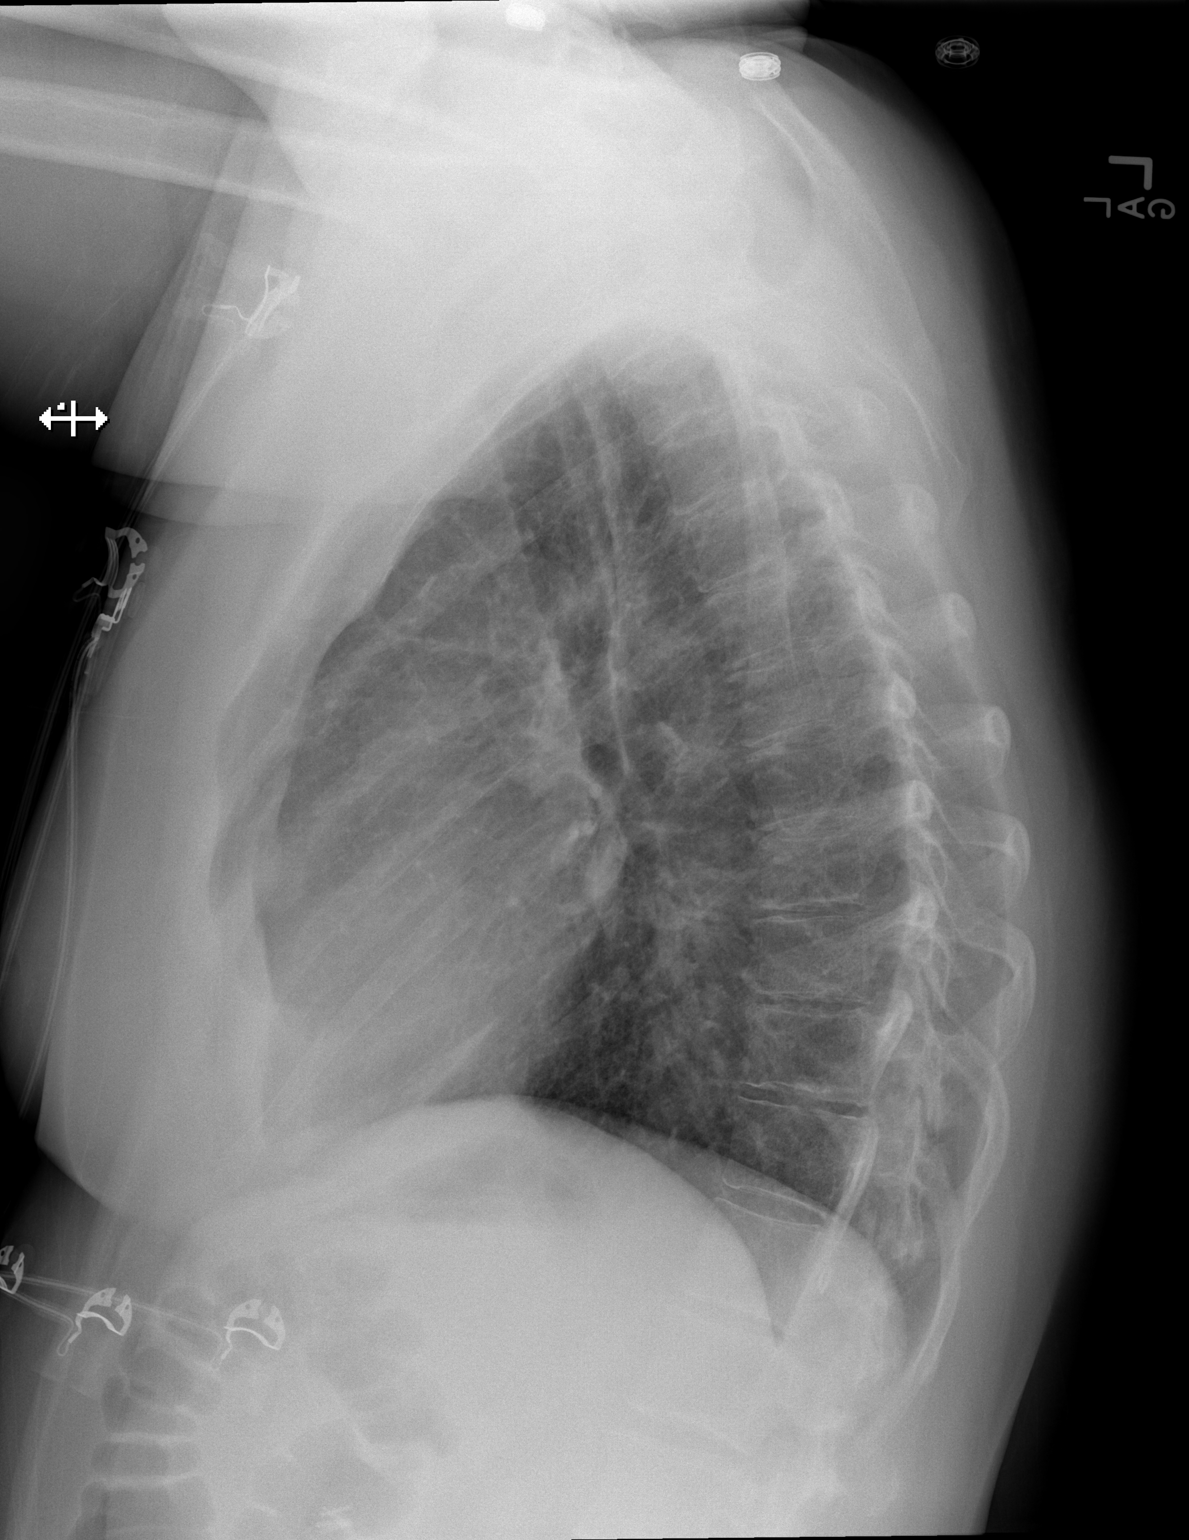

[2 of 2 positions shown; findings below may reference images not displayed]

FINDINGS: Lungs clear. Heart size normal. No pneumothorax or
pleural effusion. Heart size normal.
IMPRESSION: No acute disease.

## 2013-12-14 ENCOUNTER — Emergency Department (HOSPITAL_COMMUNITY)
Admission: EM | Admit: 2013-12-14 | Discharge: 2013-12-14 | Disposition: A | Discharge status: Home / Self Care | Payer: Medicare Other | Attending: Emergency Medicine | Admitting: Emergency Medicine

## 2013-12-14 ENCOUNTER — Encounter (HOSPITAL_COMMUNITY): Payer: Self-pay | Admitting: Emergency Medicine

## 2013-12-14 ENCOUNTER — Emergency Department (HOSPITAL_COMMUNITY): Payer: Medicare Other

## 2013-12-14 DIAGNOSIS — Z880 Allergy status to penicillin: Secondary | ICD-10-CM | POA: Diagnosis not present

## 2013-12-14 DIAGNOSIS — F17200 Nicotine dependence, unspecified, uncomplicated: Secondary | ICD-10-CM | POA: Diagnosis not present

## 2013-12-14 DIAGNOSIS — I10 Essential (primary) hypertension: Secondary | ICD-10-CM | POA: Insufficient documentation

## 2013-12-14 DIAGNOSIS — S99919A Unspecified injury of unspecified ankle, initial encounter: Secondary | ICD-10-CM | POA: Diagnosis not present

## 2013-12-14 DIAGNOSIS — X500XXA Overexertion from strenuous movement or load, initial encounter: Secondary | ICD-10-CM | POA: Insufficient documentation

## 2013-12-14 DIAGNOSIS — I252 Old myocardial infarction: Secondary | ICD-10-CM | POA: Insufficient documentation

## 2013-12-14 DIAGNOSIS — M25569 Pain in unspecified knee: Secondary | ICD-10-CM | POA: Diagnosis not present

## 2013-12-14 DIAGNOSIS — Z79899 Other long term (current) drug therapy: Secondary | ICD-10-CM | POA: Insufficient documentation

## 2013-12-14 DIAGNOSIS — F3130 Bipolar disorder, current episode depressed, mild or moderate severity, unspecified: Secondary | ICD-10-CM | POA: Insufficient documentation

## 2013-12-14 DIAGNOSIS — E039 Hypothyroidism, unspecified: Secondary | ICD-10-CM | POA: Diagnosis not present

## 2013-12-14 DIAGNOSIS — IMO0002 Reserved for concepts with insufficient information to code with codable children: Secondary | ICD-10-CM | POA: Insufficient documentation

## 2013-12-14 DIAGNOSIS — J449 Chronic obstructive pulmonary disease, unspecified: Secondary | ICD-10-CM | POA: Diagnosis not present

## 2013-12-14 DIAGNOSIS — J4489 Other specified chronic obstructive pulmonary disease: Secondary | ICD-10-CM | POA: Insufficient documentation

## 2013-12-14 DIAGNOSIS — E119 Type 2 diabetes mellitus without complications: Secondary | ICD-10-CM | POA: Diagnosis not present

## 2013-12-14 DIAGNOSIS — R911 Solitary pulmonary nodule: Secondary | ICD-10-CM | POA: Insufficient documentation

## 2013-12-14 DIAGNOSIS — S8391XA Sprain of unspecified site of right knee, initial encounter: Secondary | ICD-10-CM

## 2013-12-14 DIAGNOSIS — K219 Gastro-esophageal reflux disease without esophagitis: Secondary | ICD-10-CM | POA: Insufficient documentation

## 2013-12-14 DIAGNOSIS — Y9389 Activity, other specified: Secondary | ICD-10-CM | POA: Insufficient documentation

## 2013-12-14 DIAGNOSIS — Y929 Unspecified place or not applicable: Secondary | ICD-10-CM | POA: Insufficient documentation

## 2013-12-14 DIAGNOSIS — S8990XA Unspecified injury of unspecified lower leg, initial encounter: Secondary | ICD-10-CM | POA: Diagnosis not present

## 2013-12-14 DIAGNOSIS — I2510 Atherosclerotic heart disease of native coronary artery without angina pectoris: Secondary | ICD-10-CM | POA: Insufficient documentation

## 2013-12-14 MED ORDER — ETODOLAC 500 MG PO TABS: 500.0000 mg | ORAL_TABLET | Freq: Two times a day (BID) | ORAL | Status: DC

## 2013-12-14 MED ORDER — HYDROMORPHONE HCL PF 1 MG/ML IJ SOLN
1.0000 mg | Freq: Once | INTRAMUSCULAR | Status: AC
  Administered 2013-12-14: 1 mg via INTRAMUSCULAR
  Filled 2013-12-14: qty 1

## 2013-12-14 NOTE — Discharge Instructions (Signed)
Joint Sprain A sprain is a tear or stretch in the ligaments that hold a joint together. Severe sprains may need as long as 3-6 weeks of immobilization and/or exercises to heal completely. Sprained joints should be rested and protected. If not, they can become unstable and prone to re-injury. Proper treatment can reduce your pain, shorten the period of disability, and reduce the risk of repeated injuries. TREATMENT   Rest and elevate the injured joint to reduce pain and swelling.  Apply ice packs to the injury for 20-30 minutes every 2-3 hours for the next 2-3 days.  Keep the injury wrapped in a compression bandage or splint as long as the joint is painful or as instructed by your caregiver.  Do not use the injured joint until it is completely healed to prevent re-injury and chronic instability. Follow the instructions of your caregiver.  Long-term sprain management may require exercises and/or treatment by a physical therapist. Taping or special braces may help stabilize the joint until it is completely better. SEEK MEDICAL CARE IF:   You develop increased pain or swelling of the joint.  You develop increasing redness and warmth of the joint.  You develop a fever.  It becomes stiff.  Your hand or foot gets cold or numb. Document Released: 07/01/2004 Document Revised: 08/16/2011 Document Reviewed: 06/10/2008 [facility] Patient Information 2015 [place], [place]. This information is not intended to replace advice given to you by your health care provider. Make sure you discuss any questions you have with your health care provider.

## 2013-12-14 NOTE — ED Notes (Signed)
Pt states she went to stand up and heard something "pop" in her right knee. Slight swelling noted but no deformity or bruising.

## 2013-12-14 NOTE — ED Provider Notes (Signed)
CSN: [id number]     Arrival date & time 12/14/13  2156 History   First MD Initiated Contact with Patient 12/14/13 2235     Chief Complaint  Patient presents with  . Knee Pain     HPI Pt was standing up and twisting when she heard a pop in her knee.  The pain radiated down from her knee to her heel.  She is now having severe pain in her knee.  She is not able to walk and bear weight.     The pain is behind the knee but also in the front by her knee cap.  No fevers.  No vomiting.  No other recent injuries.  Past Medical History  Diagnosis Date  . Diabetes mellitus   . Hypertension   . Hyperlipidemia   . COPD (chronic obstructive pulmonary disease)   . Reflux   . Myocardial infarction   . CAD (coronary artery disease)   . Hypothyroid   . Lung nodules   . Diverticulosis   . Bipolar affective    Past Surgical History  Procedure Laterality Date  . Back surgery    . Abdominal hysterectomy    . Appendectomy    . Tonsillectomy    . Ankle surgery    . Cholecystectomy    . Nose surgery     Family History  Problem Relation Age of Onset  . Coronary artery disease Father 31  . Emphysema Father    History  Substance Use Topics  . Smoking status: Current Every Day Smoker -- 1.00 packs/day for 40 years    Types: Cigarettes  . Smokeless tobacco: Not on file  . Alcohol Use: No   OB History   Grav Para Term Preterm Abortions TAB SAB Ect Mult Living                 Review of Systems  All other systems reviewed and are negative.     Allergies  Penicillins; Pineapple; and Strawberry  Home Medications   Prior to Admission medications   Medication Sig Start Date End Date Taking? Authorizing Provider  carisoprodol (SOMA) 350 MG tablet Take 350 mg by mouth 3 (three) times daily.   Yes Historical Provider, MD  cloNIDine (CATAPRES - DOSED IN MG/24 HR) 0.3 mg/24hr patch Place 0.3 mg onto the skin once a week. Sundays   Yes Historical Provider, MD  dexlansoprazole (DEXILANT) 60 MG  capsule Take 60 mg by mouth every evening.   Yes Historical Provider, MD  diphenhydrAMINE (BENADRYL) 12.5 MG/5ML elixir Take 25 mg by mouth 4 (four) times daily as needed. Takes as needed with epi-pen for food allergies   Yes Historical Provider, MD  EPINEPHrine (EPI-PEN) 0.3 mg/0.3 mL DEVI Inject 0.3 mg into the muscle once.   Yes Historical Provider, MD  gabapentin (NEURONTIN) 600 MG tablet Take 600 mg by mouth 3 (three) times daily.   Yes Historical Provider, MD  hydrALAZINE (APRESOLINE) 100 MG tablet Take 100 mg by mouth 2 (two) times daily.   Yes Historical Provider, MD  hydrochlorothiazide (HYDRODIURIL) 25 MG tablet Take 25 mg by mouth 2 (two) times daily.   Yes Historical Provider, MD  isosorbide mononitrate (IMDUR) 60 MG 24 hr tablet Take 60 mg by mouth daily.   Yes Historical Provider, MD  labetalol (NORMODYNE) 300 MG tablet Take 300 mg by mouth 2 (two) times daily.   Yes Historical Provider, MD  levothyroxine (SYNTHROID, LEVOTHROID) 50 MCG tablet Take 25 mcg by mouth daily before breakfast.  Yes Historical Provider, MD  metFORMIN (GLUCOPHAGE) 500 MG tablet Take 500 mg by mouth 2 (two) times daily with a meal.   Yes Historical Provider, MD  montelukast (SINGULAIR) 10 MG tablet Take 10 mg by mouth at bedtime.   Yes Historical Provider, MD  oxyCODONE-acetaminophen (PERCOCET/ROXICET) 5-325 MG per tablet Take 1 tablet by mouth every 4 (four) hours as needed for severe pain (for pain).   Yes Historical Provider, MD  rOPINIRole (REQUIP) 3 MG tablet Take 3 mg by mouth at bedtime.   Yes Historical Provider, MD  triamterene-hydrochlorothiazide (DYAZIDE) 37.5-25 MG per capsule Take 1 capsule by mouth daily.   Yes Historical Provider, MD  zolpidem (AMBIEN) 10 MG tablet Take 10 mg by mouth at bedtime.   Yes Historical Provider, MD  etodolac (LODINE) 500 MG tablet Take 1 tablet (500 mg total) by mouth 2 (two) times daily. 12/14/13   Dorie Rank, MD   BP 190/64  Pulse 74  Temp(Src) 98.3 F (36.8 C)  (Oral)  Resp 22  Ht 5\' 7"  (1.702 m)  Wt 216 lb (97.977 kg)  BMI 33.82 kg/m2  SpO2 98% Physical Exam  Nursing note and vitals reviewed. Constitutional: She appears well-developed and well-nourished. No distress.  HENT:  Head: Normocephalic and atraumatic.  Right Ear: External ear normal.  Left Ear: External ear normal.  Eyes: Conjunctivae are normal. Right eye exhibits no discharge. Left eye exhibits no discharge. No scleral icterus.  Neck: Neck supple. No tracheal deviation present.  Cardiovascular: Normal rate.   Pulmonary/Chest: Effort normal. No stridor. No respiratory distress.  Musculoskeletal: She exhibits no edema.       Right knee: She exhibits decreased range of motion. She exhibits no swelling, no effusion, no ecchymosis, no deformity, no erythema and normal alignment. Tenderness found.  ttp popliteal fossa, ttp medial and lateral meniscal region, ttp patella; normal distal perfusion and sensation, able to lift her leg off the bed with leg straight  Neurological: She is alert. Cranial nerve deficit: no gross deficits.  Skin: Skin is warm and dry. No rash noted.  Psychiatric: She has a normal mood and affect.    ED Course  Procedures (including critical care time) Labs Review Labs Reviewed - No data to display  Imaging Review Dg Knee Complete 4 Views Right  12/14/2013   CLINICAL DATA:  Severe pain and swelling of the posterior knee after injury 4 hr ago.  EXAM: RIGHT KNEE - COMPLETE 4+ VIEW  COMPARISON:  None.  FINDINGS: There is no evidence of fracture, dislocation, or joint effusion. There is no evidence of arthropathy or other focal bone abnormality. Soft tissues are unremarkable.  IMPRESSION: Negative.   Electronically Signed   By: Lucienne Capers M.D.   On: 12/14/2013 22:16    MDM   Final diagnoses:  Knee sprain, right, initial encounter    No sign of dislocation. No effusion on xray.  No sign of patella rupture based on exam.  Will provide crutches, follow up  with ortho    Dorie Rank, MD 12/14/13 2308

## 2013-12-28 ENCOUNTER — Encounter (HOSPITAL_COMMUNITY): Payer: Self-pay | Admitting: Emergency Medicine

## 2013-12-28 ENCOUNTER — Emergency Department (HOSPITAL_COMMUNITY)
Admission: EM | Admit: 2013-12-28 | Discharge: 2013-12-29 | Disposition: A | Discharge status: Home / Self Care | Payer: Medicare Other | Attending: Emergency Medicine | Admitting: Emergency Medicine

## 2013-12-28 ENCOUNTER — Emergency Department (HOSPITAL_COMMUNITY): Payer: Medicare Other

## 2013-12-28 DIAGNOSIS — J441 Chronic obstructive pulmonary disease with (acute) exacerbation: Secondary | ICD-10-CM | POA: Insufficient documentation

## 2013-12-28 DIAGNOSIS — E039 Hypothyroidism, unspecified: Secondary | ICD-10-CM | POA: Diagnosis not present

## 2013-12-28 DIAGNOSIS — Z880 Allergy status to penicillin: Secondary | ICD-10-CM | POA: Diagnosis not present

## 2013-12-28 DIAGNOSIS — F17200 Nicotine dependence, unspecified, uncomplicated: Secondary | ICD-10-CM | POA: Diagnosis not present

## 2013-12-28 DIAGNOSIS — K219 Gastro-esophageal reflux disease without esophagitis: Secondary | ICD-10-CM | POA: Insufficient documentation

## 2013-12-28 DIAGNOSIS — I2510 Atherosclerotic heart disease of native coronary artery without angina pectoris: Secondary | ICD-10-CM | POA: Diagnosis not present

## 2013-12-28 DIAGNOSIS — R079 Chest pain, unspecified: Secondary | ICD-10-CM | POA: Diagnosis not present

## 2013-12-28 DIAGNOSIS — J4489 Other specified chronic obstructive pulmonary disease: Secondary | ICD-10-CM | POA: Diagnosis not present

## 2013-12-28 DIAGNOSIS — H539 Unspecified visual disturbance: Secondary | ICD-10-CM | POA: Diagnosis not present

## 2013-12-28 DIAGNOSIS — R110 Nausea: Secondary | ICD-10-CM | POA: Diagnosis not present

## 2013-12-28 DIAGNOSIS — I10 Essential (primary) hypertension: Secondary | ICD-10-CM | POA: Insufficient documentation

## 2013-12-28 DIAGNOSIS — R51 Headache: Secondary | ICD-10-CM | POA: Diagnosis not present

## 2013-12-28 DIAGNOSIS — J449 Chronic obstructive pulmonary disease, unspecified: Secondary | ICD-10-CM | POA: Insufficient documentation

## 2013-12-28 DIAGNOSIS — R519 Headache, unspecified: Secondary | ICD-10-CM

## 2013-12-28 DIAGNOSIS — Z79899 Other long term (current) drug therapy: Secondary | ICD-10-CM | POA: Insufficient documentation

## 2013-12-28 DIAGNOSIS — E785 Hyperlipidemia, unspecified: Secondary | ICD-10-CM | POA: Insufficient documentation

## 2013-12-28 DIAGNOSIS — E119 Type 2 diabetes mellitus without complications: Secondary | ICD-10-CM | POA: Insufficient documentation

## 2013-12-28 DIAGNOSIS — I252 Old myocardial infarction: Secondary | ICD-10-CM | POA: Diagnosis not present

## 2013-12-28 DIAGNOSIS — F319 Bipolar disorder, unspecified: Secondary | ICD-10-CM | POA: Diagnosis not present

## 2013-12-28 NOTE — ED Notes (Signed)
Pt complaining of her head hurting, nausea, numbness to the left arm & fingers. Pt states she has a hx of angina

## 2013-12-28 NOTE — ED Notes (Signed)
Pt c/o headache since earlier today on the left side of her head, fingers on left side have been numb and tingly, as well as some chest discomfort since yesterday. Pt states when her chest hurts, it burns and is a little sharp. Pt has a history of high blood pressure. Pt's blood pressure currently is 209/112.

## 2013-12-28 NOTE — ED Notes (Signed)
attempted iv x3, pt states unable to stand it. EDP in room at the time pt says she cant take anymore.

## 2013-12-28 NOTE — ED Provider Notes (Addendum)
CSN: [id number]     Arrival date & time 12/28/13  2142 History   First MD Initiated Contact with Patient 12/28/13 2304   This chart was scribed for Katrina Fines, MD by Rosary Lively, ED scribe. This patient was seen in room APA03/APA03 and the patient's care was started at 11:06 PM.    Chief Complaint  Patient presents with  . Headache   The history is provided by the patient. No language interpreter was used.   HPI Comments:  Katrina [name] is a 53 y.o. female who presents to the Emergency Department complaining of a severe headache on the left side, onset this morning. Pt reports associated symptoms of tingling in finger tips of left hand, intermittent CP in the left upper chest, SOB, and nausea, onset 3 hours ago. Pt also reports that she has experienced weakness in the left arm.  Pt states that CP is chracterized by sharpness and squeezing that lasts approximately 2 hours, with no modifying factors. Pt states that she has a h/o migraine headaches, but this is different from her normal migraine. Pt states that she is also experiencing blurred vision in the left eye. Pt denies vomiting. Pt denies taking any medication to treat her pain.   Past Medical History  Diagnosis Date  . Diabetes mellitus   . Hypertension   . Hyperlipidemia   . COPD (chronic obstructive pulmonary disease)   . Reflux   . Myocardial infarction   . CAD (coronary artery disease)   . Hypothyroid   . Lung nodules   . Diverticulosis   . Bipolar affective   . Normal cardiac stress test 09/2013   Past Surgical History  Procedure Laterality Date  . Back surgery    . Abdominal hysterectomy    . Appendectomy    . Tonsillectomy    . Ankle surgery    . Cholecystectomy    . Nose surgery     Family History  Problem Relation Age of Onset  . Coronary artery disease Father 59  . Emphysema Father    History  Substance Use Topics  . Smoking status: Current Every Day Smoker -- 1.00 packs/day for 40 years    Types:  Cigarettes  . Smokeless tobacco: Not on file  . Alcohol Use: No   OB History   Grav Para Term Preterm Abortions TAB SAB Ect Mult Living                 Review of Systems  Eyes: Positive for visual disturbance.  Respiratory: Positive for shortness of breath.   Cardiovascular: Positive for chest pain.  Gastrointestinal: Positive for nausea.  Neurological: Positive for headaches.  All other systems reviewed and are negative.   Allergies  Penicillins; Pineapple; Strawberry; and Aspirin  Home Medications   Prior to Admission medications   Medication Sig Start Date End Date Taking? Authorizing Provider  carisoprodol (SOMA) 350 MG tablet Take 350 mg by mouth 3 (three) times daily.   Yes Historical Provider, MD  cloNIDine (CATAPRES - DOSED IN MG/24 HR) 0.3 mg/24hr patch Place 0.3 mg onto the skin once a week. Sundays   Yes Historical Provider, MD  dexlansoprazole (DEXILANT) 60 MG capsule Take 60 mg by mouth every evening.   Yes Historical Provider, MD  furosemide (LASIX) 20 MG tablet Take 20 mg by mouth daily as needed for fluid.   Yes Historical Provider, MD  gabapentin (NEURONTIN) 600 MG tablet Take 600-1,200 mg by mouth at bedtime.    Yes  Historical Provider, MD  hydrALAZINE (APRESOLINE) 100 MG tablet Take 100 mg by mouth 2 (two) times daily.   Yes Historical Provider, MD  isosorbide mononitrate (IMDUR) 60 MG 24 hr tablet Take 60 mg by mouth every evening.    Yes Historical Provider, MD  labetalol (NORMODYNE) 300 MG tablet Take 300 mg by mouth 2 (two) times daily.   Yes Historical Provider, MD  levothyroxine (SYNTHROID, LEVOTHROID) 50 MCG tablet Take 50 mcg by mouth at bedtime.    Yes Historical Provider, MD  oxyCODONE-acetaminophen (PERCOCET/ROXICET) 5-325 MG per tablet Take 1 tablet by mouth every 4 (four) hours as needed for severe pain (for pain).   Yes Historical Provider, MD  rOPINIRole (REQUIP) 3 MG tablet Take 3 mg by mouth at bedtime.   Yes Historical Provider, MD   triamterene-hydrochlorothiazide (DYAZIDE) 37.5-25 MG per capsule Take 1 capsule by mouth every morning.    Yes Historical Provider, MD  zolpidem (AMBIEN) 10 MG tablet Take 10 mg by mouth at bedtime.   Yes Historical Provider, MD  diphenhydrAMINE (BENADRYL) 12.5 MG/5ML elixir Take 25 mg by mouth 4 (four) times daily as needed. Takes as needed with epi-pen for food allergies    Historical Provider, MD  EPINEPHrine (EPI-PEN) 0.3 mg/0.3 mL DEVI Inject 0.3 mg into the muscle once.    Historical Provider, MD   BP 209/112  Pulse 91  Temp(Src) 99.2 F (37.3 C) (Oral)  Resp 20  Ht 5\' 7"  (1.702 m)  Wt 216 lb (97.977 kg)  BMI 33.82 kg/m2  SpO2 99% Physical Exam  Nursing note and vitals reviewed. Constitutional: She is oriented to person, place, and time. She appears well-developed and well-nourished.  HENT:  Head: Normocephalic and atraumatic.  Eyes: EOM are normal. Pupils are equal, round, and reactive to light.  Neck: Normal range of motion. Neck supple.  Cardiovascular: Normal rate, regular rhythm and normal heart sounds.   Pulmonary/Chest: Effort normal and breath sounds normal.  Abdominal: Soft. Bowel sounds are normal. She exhibits no mass.  Musculoskeletal: Normal range of motion. She exhibits no edema.       Right shoulder: She exhibits no deformity.  Neurological: She is alert and oriented to person, place, and time. No cranial nerve deficit. She exhibits normal muscle tone. Coordination normal.  Normal coordination and speech. Normal finger to nose. Negative romberg. No facial droop. Intermittent left eye squinting (voluntary?).   Skin: Skin is warm and dry.  Psychiatric: She has a normal mood and affect. Her behavior is normal.    ED Course  Procedures  DIAGNOSTIC STUDIES: Oxygen Saturation is 99% on RA, normal by my interpretation.  COORDINATION OF CARE: 11:18 PM-Discussed treatment plan with pt at bedside and pt agreed to plan.    EKG Interpretation   Date/Time:   Friday December 28 2013 21:53:44 EDT Ventricular Rate:  88 PR Interval:  172 QRS Duration: 82 QT Interval:  388 QTC Calculation: 469 R Axis:   39 Text Interpretation:  Normal sinus rhythm Possible Left atrial enlargement  Borderline ECG Confirmed by COOK  MD, BRIAN ([id number]) on 12/28/2013 10:32:20  PM      MDM   Nursing notes and vitals signs, including pulse oximetry, reviewed.  Summary of this visit's results, reviewed by myself:  Labs:  Results for orders placed during the hospital encounter of 12/28/13 (from the past 24 hour(s))  CBC WITH DIFFERENTIAL     Status: None   Collection Time    12/28/13 11:36 PM  Result Value Ref Range   WBC 8.8  4.0 - 10.5 K/uL   RBC 4.54  3.87 - 5.11 MIL/uL   Hemoglobin 13.6  12.0 - 15.0 g/dL   HCT 40.2  36.0 - 46.0 %   MCV 88.5  78.0 - 100.0 fL   MCH 30.0  26.0 - 34.0 pg   MCHC 33.8  30.0 - 36.0 g/dL   RDW 14.0  11.5 - 15.5 %   Platelets 255  150 - 400 K/uL   Neutrophils Relative % 47  43 - 77 %   Neutro Abs 4.1  1.7 - 7.7 K/uL   Lymphocytes Relative 43  12 - 46 %   Lymphs Abs 3.8  0.7 - 4.0 K/uL   Monocytes Relative 6  3 - 12 %   Monocytes Absolute 0.6  0.1 - 1.0 K/uL   Eosinophils Relative 3  0 - 5 %   Eosinophils Absolute 0.3  0.0 - 0.7 K/uL   Basophils Relative 1  0 - 1 %   Basophils Absolute 0.1  0.0 - 0.1 K/uL  BASIC METABOLIC PANEL     Status: Abnormal   Collection Time    12/28/13 11:36 PM      Result Value Ref Range   Sodium 140  137 - 147 mEq/L   Potassium 3.7  3.7 - 5.3 mEq/L   Chloride 105  96 - 112 mEq/L   CO2 24  19 - 32 mEq/L   Glucose, Bld 104 (*) 70 - 99 mg/dL   BUN 17  6 - 23 mg/dL   Creatinine, Ser 0.71  0.50 - 1.10 mg/dL   Calcium 8.9  8.4 - 10.5 mg/dL   GFR calc non Af Amer >90  >90 mL/min   GFR calc Af Amer >90  >90 mL/min   Anion gap 11  5 - 15  TROPONIN I     Status: None   Collection Time    12/28/13 11:36 PM      Result Value Ref Range   Troponin I <0.30  <0.30 ng/mL    Imaging Studies: Ct  Head Wo Contrast  12/29/2013   CLINICAL DATA:  Headache.  Nausea.  EXAM: CT HEAD WITHOUT CONTRAST  TECHNIQUE: Contiguous axial images were obtained from the base of the skull through the vertex without intravenous contrast.  COMPARISON:  None.  FINDINGS: There is no evidence for acute hemorrhage, hydrocephalus, mass lesion, or abnormal extra-axial fluid collection. No definite CT evidence for acute infarction. The visualized paranasal sinuses and mastoid air cells are clear.  IMPRESSION: No acute intracranial abnormality.   Electronically Signed   By: Misty Stanley M.D.   On: 12/29/2013 01:12     12:56 AM BP 185/86 prior to any intervention. Benadryl and Reglan ordered for headache.  1:11 AM Patient eloped without an opportunity for me to speak with her.   Katrina Fines, MD 12/29/13 [place], MD 12/29/13 [phone]

## 2013-12-29 DIAGNOSIS — R51 Headache: Secondary | ICD-10-CM | POA: Diagnosis not present

## 2013-12-29 LAB — CBC WITH DIFFERENTIAL/PLATELET
BASOS ABS: 0.1 10*3/uL (ref 0.0–0.1)
BASOS PCT: 1 % (ref 0–1)
Eosinophils Absolute: 0.3 10*3/uL (ref 0.0–0.7)
Eosinophils Relative: 3 % (ref 0–5)
HCT: 40.2 % (ref 36.0–46.0)
HEMOGLOBIN: 13.6 g/dL (ref 12.0–15.0)
LYMPHS PCT: 43 % (ref 12–46)
Lymphs Abs: 3.8 10*3/uL (ref 0.7–4.0)
MCH: 30 pg (ref 26.0–34.0)
MCHC: 33.8 g/dL (ref 30.0–36.0)
MCV: 88.5 fL (ref 78.0–100.0)
MONO ABS: 0.6 10*3/uL (ref 0.1–1.0)
Monocytes Relative: 6 % (ref 3–12)
Neutro Abs: 4.1 10*3/uL (ref 1.7–7.7)
Neutrophils Relative %: 47 % (ref 43–77)
PLATELETS: 255 10*3/uL (ref 150–400)
RBC: 4.54 MIL/uL (ref 3.87–5.11)
RDW: 14 % (ref 11.5–15.5)
WBC: 8.8 10*3/uL (ref 4.0–10.5)

## 2013-12-29 LAB — BASIC METABOLIC PANEL
ANION GAP: 11 (ref 5–15)
BUN: 17 mg/dL (ref 6–23)
CHLORIDE: 105 meq/L (ref 96–112)
CO2: 24 mEq/L (ref 19–32)
CREATININE: 0.71 mg/dL (ref 0.50–1.10)
Calcium: 8.9 mg/dL (ref 8.4–10.5)
GFR calc non Af Amer: >90 mL/min (ref >90)
Glucose, Bld: 104 mg/dL — ABNORMAL HIGH (ref 70–99)
Potassium: 3.7 mEq/L (ref 3.7–5.3)
SODIUM: 140 meq/L (ref 137–147)

## 2013-12-29 LAB — TROPONIN I

## 2013-12-29 MED ORDER — METOCLOPRAMIDE HCL 5 MG/ML IJ SOLN
10.0000 mg | Freq: Once | INTRAMUSCULAR | Status: AC
Start: 2013-12-29 — End: 2013-12-29
  Administered 2013-12-29: 10 mg via INTRAVENOUS
  Filled 2013-12-29: qty 2

## 2013-12-29 MED ORDER — DIPHENHYDRAMINE HCL 50 MG/ML IJ SOLN
25.0000 mg | Freq: Once | INTRAMUSCULAR | Status: AC
  Administered 2013-12-29: 25 mg via INTRAVENOUS
  Filled 2013-12-29: qty 1

## 2013-12-29 NOTE — ED Notes (Signed)
After ordered medications given, patient voiced concern stating "i could have stayed home and taken this"  Called into patients room, patient sts "take my IV out, Im going home"  Explained to patient that her radiology results are not back, and that the MD is wanting to see how she does with the ordered medications. Patient sts "i am leaving"    I informed patient and patients family of risks and benefits of staying vs. Leaving, patient and patiens family verbalize understanding.  Patient ambulatory out of department at this time escorted by family member.

## 2014-01-25 ENCOUNTER — Emergency Department (HOSPITAL_COMMUNITY)
Admission: EM | Admit: 2014-01-25 | Discharge: 2014-01-25 | Disposition: A | Discharge status: Home / Self Care | Payer: Medicare Other | Attending: Emergency Medicine | Admitting: Emergency Medicine

## 2014-01-25 ENCOUNTER — Encounter (HOSPITAL_COMMUNITY): Payer: Self-pay | Admitting: Emergency Medicine

## 2014-01-25 DIAGNOSIS — R079 Chest pain, unspecified: Secondary | ICD-10-CM | POA: Insufficient documentation

## 2014-01-25 DIAGNOSIS — J449 Chronic obstructive pulmonary disease, unspecified: Secondary | ICD-10-CM | POA: Diagnosis not present

## 2014-01-25 DIAGNOSIS — I10 Essential (primary) hypertension: Secondary | ICD-10-CM | POA: Insufficient documentation

## 2014-01-25 DIAGNOSIS — Z880 Allergy status to penicillin: Secondary | ICD-10-CM | POA: Insufficient documentation

## 2014-01-25 DIAGNOSIS — R51 Headache: Secondary | ICD-10-CM | POA: Diagnosis not present

## 2014-01-25 DIAGNOSIS — I2510 Atherosclerotic heart disease of native coronary artery without angina pectoris: Secondary | ICD-10-CM | POA: Diagnosis not present

## 2014-01-25 DIAGNOSIS — K219 Gastro-esophageal reflux disease without esophagitis: Secondary | ICD-10-CM | POA: Diagnosis not present

## 2014-01-25 DIAGNOSIS — Z79899 Other long term (current) drug therapy: Secondary | ICD-10-CM | POA: Diagnosis not present

## 2014-01-25 DIAGNOSIS — I252 Old myocardial infarction: Secondary | ICD-10-CM | POA: Insufficient documentation

## 2014-01-25 DIAGNOSIS — E119 Type 2 diabetes mellitus without complications: Secondary | ICD-10-CM | POA: Diagnosis not present

## 2014-01-25 DIAGNOSIS — F319 Bipolar disorder, unspecified: Secondary | ICD-10-CM | POA: Insufficient documentation

## 2014-01-25 DIAGNOSIS — M542 Cervicalgia: Secondary | ICD-10-CM | POA: Diagnosis not present

## 2014-01-25 DIAGNOSIS — F17200 Nicotine dependence, unspecified, uncomplicated: Secondary | ICD-10-CM | POA: Insufficient documentation

## 2014-01-25 DIAGNOSIS — I160 Hypertensive urgency: Secondary | ICD-10-CM

## 2014-01-25 DIAGNOSIS — E785 Hyperlipidemia, unspecified: Secondary | ICD-10-CM | POA: Diagnosis not present

## 2014-01-25 DIAGNOSIS — J4489 Other specified chronic obstructive pulmonary disease: Secondary | ICD-10-CM | POA: Diagnosis not present

## 2014-01-25 LAB — COMPREHENSIVE METABOLIC PANEL
ALK PHOS: 100 U/L (ref 39–117)
ALT: 14 U/L (ref 0–35)
AST: 19 U/L (ref 0–37)
Albumin: 3.6 g/dL (ref 3.5–5.2)
Anion gap: 14 (ref 5–15)
BUN: 15 mg/dL (ref 6–23)
CHLORIDE: 105 meq/L (ref 96–112)
CO2: 23 mEq/L (ref 19–32)
Calcium: 9.1 mg/dL (ref 8.4–10.5)
Creatinine, Ser: 0.72 mg/dL (ref 0.50–1.10)
GFR calc non Af Amer: >90 mL/min (ref >90)
Glucose, Bld: 118 mg/dL — ABNORMAL HIGH (ref 70–99)
POTASSIUM: 4.2 meq/L (ref 3.7–5.3)
Sodium: 142 mEq/L (ref 137–147)
Total Protein: 7.2 g/dL (ref 6.0–8.3)

## 2014-01-25 LAB — CBC WITH DIFFERENTIAL/PLATELET
Basophils Absolute: 0.1 10*3/uL (ref 0.0–0.1)
Basophils Relative: 1 % (ref 0–1)
Eosinophils Absolute: 0.3 10*3/uL (ref 0.0–0.7)
Eosinophils Relative: 3 % (ref 0–5)
HCT: 38.9 % (ref 36.0–46.0)
HEMOGLOBIN: 13 g/dL (ref 12.0–15.0)
LYMPHS PCT: 47 % — AB (ref 12–46)
Lymphs Abs: 4.5 10*3/uL — ABNORMAL HIGH (ref 0.7–4.0)
MCH: 29.4 pg (ref 26.0–34.0)
MCHC: 33.4 g/dL (ref 30.0–36.0)
MCV: 88 fL (ref 78.0–100.0)
MONOS PCT: 6 % (ref 3–12)
Monocytes Absolute: 0.6 10*3/uL (ref 0.1–1.0)
NEUTROS ABS: 4.3 10*3/uL (ref 1.7–7.7)
Neutrophils Relative %: 45 % (ref 43–77)
Platelets: 249 10*3/uL (ref 150–400)
RBC: 4.42 MIL/uL (ref 3.87–5.11)
RDW: 14.3 % (ref 11.5–15.5)
WBC: 9.7 10*3/uL (ref 4.0–10.5)

## 2014-01-25 LAB — TROPONIN I: Troponin I: <0.3 ng/mL (ref <0.30)

## 2014-01-25 MED ORDER — LABETALOL HCL 300 MG PO TABS: 300.0000 mg | ORAL_TABLET | Freq: Two times a day (BID) | ORAL | Status: DC

## 2014-01-25 MED ORDER — LEVOTHYROXINE SODIUM 50 MCG PO TABS: 50.0000 ug | ORAL_TABLET | Freq: Every day | ORAL | Status: DC

## 2014-01-25 MED ORDER — DEXLANSOPRAZOLE 60 MG PO CPDR: 60.0000 mg | DELAYED_RELEASE_CAPSULE | Freq: Every evening | ORAL | Status: DC

## 2014-01-25 MED ORDER — LABETALOL HCL 200 MG PO TABS
200.0000 mg | ORAL_TABLET | Freq: Once | ORAL | Status: AC
  Administered 2014-01-25: 200 mg via ORAL
  Filled 2014-01-25: qty 1

## 2014-01-25 MED ORDER — LABETALOL HCL 200 MG PO TABS
ORAL_TABLET | ORAL | Status: AC
  Filled 2014-01-25: qty 1

## 2014-01-25 MED ORDER — ROPINIROLE HCL 3 MG PO TABS: 3.0000 mg | ORAL_TABLET | Freq: Every day | ORAL | Status: DC

## 2014-01-25 MED ORDER — TRIAMTERENE-HCTZ 37.5-25 MG PO CAPS: 1.0000 | ORAL_CAPSULE | Freq: Every morning | ORAL | Status: DC

## 2014-01-25 MED ORDER — GABAPENTIN 600 MG PO TABS: 600.0000 mg | ORAL_TABLET | Freq: Three times a day (TID) | ORAL | Status: DC

## 2014-01-25 MED ORDER — CLONIDINE HCL 0.3 MG/24HR TD PTWK
0.3000 mg | MEDICATED_PATCH | TRANSDERMAL | Status: DC
Start: 2014-01-25 — End: 2014-03-20

## 2014-01-25 MED ORDER — ISOSORBIDE MONONITRATE ER 60 MG PO TB24: 60.0000 mg | ORAL_TABLET | Freq: Every evening | ORAL | Status: DC

## 2014-01-25 MED ORDER — FUROSEMIDE 20 MG PO TABS: 20.0000 mg | ORAL_TABLET | Freq: Every day | ORAL | Status: DC | PRN

## 2014-01-25 MED ORDER — HYDRALAZINE HCL 100 MG PO TABS: 100.0000 mg | ORAL_TABLET | Freq: Two times a day (BID) | ORAL | Status: DC

## 2014-01-25 NOTE — ED Notes (Signed)
Pt given discharge instructions and prescriptions , verbalized understanding .

## 2014-01-25 NOTE — ED Notes (Signed)
Dr. Stark Jock at bedside; see EDP note.

## 2014-01-25 NOTE — ED Notes (Signed)
Nursing supervisor contacted re- needing Labetalol 200mg  po tab  ( not available in [facility])

## 2014-01-25 NOTE — ED Provider Notes (Signed)
CSN: [id number]     Arrival date & time 01/25/14  1810 History   This chart was scribed for Veryl Speak, MD, by Neta Ehlers, ED Scribe. This patient was seen in room APA08/APA08 and the patient's care was started at 6:25 PM.  First MD Initiated Contact with Patient 01/25/14 1824     Chief Complaint  Patient presents with  . Headache    The history is provided by the patient. No language interpreter was used.   HPI Comments: Katrina [name] is a 53 y.o. female, with a h/o HTN, DM, and CAD, who presents to the Emergency Department complaining of a headache which has persisted for four days and which she rates as 10/10. She endorses a h/o migraines.The pt also reports intermittent chest discomfort and left-sided neck pain. She states she has been out of her BP medication for approximately two weeks because she recently moved to the area from [place] and has not established a PCP; in the ED her BP is 185/119. The pt states her BP is chronically high.  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 She denies cardiac stents. The pt was treated for a headache and chest pain a month ago in the ED. She is a current smoker.   Past Medical History  Diagnosis Date  . Diabetes mellitus   . Hypertension   . Hyperlipidemia   . COPD (chronic obstructive pulmonary disease)   . Reflux   . Myocardial infarction   . CAD (coronary artery disease)   . Hypothyroid   . Lung nodules   . Diverticulosis   . Bipolar affective   . Normal cardiac stress test 09/2013   Past Surgical History  Procedure Laterality Date  . Back surgery    . Abdominal hysterectomy    . Appendectomy    . Tonsillectomy    . Ankle surgery    . Cholecystectomy    . Nose surgery     Family History  Problem Relation Age of Onset  . Coronary artery disease Father 40  . Emphysema Father    History  Substance Use Topics  . Smoking  status: Current Every Day Smoker -- 1.00 packs/day for 40 years    Types: Cigarettes  . Smokeless tobacco: Not on file  . Alcohol Use: No   No OB history provided.  Review of Systems  Cardiovascular: Positive for chest pain.  Musculoskeletal: Positive for neck pain.  Neurological: Positive for headaches.  All other systems reviewed and are negative.   Allergies  Penicillins; Pineapple; Strawberry; and Aspirin  Home Medications   Prior to Admission medications   Medication Sig Start Date End Date Taking? Authorizing Provider  carisoprodol (SOMA) 350 MG tablet Take 350 mg by mouth 3 (three) times daily.    Historical Provider, MD  cloNIDine (CATAPRES - DOSED IN MG/24 HR) 0.3 mg/24hr patch Place 0.3 mg onto the skin once a week. Sundays    Historical Provider, MD  dexlansoprazole (DEXILANT) 60 MG capsule Take 60 mg by mouth every evening.    Historical Provider, MD  diphenhydrAMINE (BENADRYL) 12.5 MG/5ML elixir Take 25 mg by mouth 4 (four) times daily as needed. Takes as needed with epi-pen for food allergies    Historical Provider, MD  EPINEPHrine (EPI-PEN) 0.3 mg/0.3 mL DEVI Inject 0.3 mg into the muscle once.    Historical Provider, MD  furosemide (LASIX) 20 MG tablet Take  20 mg by mouth daily as needed for fluid.    Historical Provider, MD  gabapentin (NEURONTIN) 600 MG tablet Take 600-1,200 mg by mouth at bedtime.     Historical Provider, MD  hydrALAZINE (APRESOLINE) 100 MG tablet Take 100 mg by mouth 2 (two) times daily.    Historical Provider, MD  isosorbide mononitrate (IMDUR) 60 MG 24 hr tablet Take 60 mg by mouth every evening.     Historical Provider, MD  labetalol (NORMODYNE) 300 MG tablet Take 300 mg by mouth 2 (two) times daily.    Historical Provider, MD  levothyroxine (SYNTHROID, LEVOTHROID) 50 MCG tablet Take 50 mcg by mouth at bedtime.     Historical Provider, MD  oxyCODONE-acetaminophen (PERCOCET/ROXICET) 5-325 MG per tablet Take 1 tablet by mouth every 4 (four) hours  as needed for severe pain (for pain).    Historical Provider, MD  rOPINIRole (REQUIP) 3 MG tablet Take 3 mg by mouth at bedtime.    Historical Provider, MD  triamterene-hydrochlorothiazide (DYAZIDE) 37.5-25 MG per capsule Take 1 capsule by mouth every morning.     Historical Provider, MD  zolpidem (AMBIEN) 10 MG tablet Take 10 mg by mouth at bedtime.    Historical Provider, MD   Triage Vitals: BP 185/119  Pulse 83  Temp(Src) 98.6 F (37 C) (Oral)  Resp 18  Ht 5\' 7"  (1.702 m)  Wt 216 lb (97.977 kg)  BMI 33.82 kg/m2  SpO2 98%  Physical Exam  Nursing note and vitals reviewed. Constitutional: She is oriented to person, place, and time. She appears well-developed and well-nourished. No distress.  HENT:  Head: Normocephalic and atraumatic.  Eyes: Conjunctivae and EOM are normal. Pupils are equal, round, and reactive to light.  Neck: Neck supple. No tracheal deviation present.  Cardiovascular: Normal rate, regular rhythm and normal heart sounds.   No murmur heard. Pulmonary/Chest: Effort normal and breath sounds normal. No respiratory distress. She has no wheezes.  Musculoskeletal: Normal range of motion.  Lymphadenopathy:    She has no cervical adenopathy.  Neurological: She is alert and oriented to person, place, and time. No cranial nerve deficit. She exhibits normal muscle tone. Coordination normal.  Skin: Skin is warm and dry.  Psychiatric: She has a normal mood and affect. Her behavior is normal.    ED Course  Procedures (including critical care time)  DIAGNOSTIC STUDIES: Oxygen Saturation is 98% on room air, normal by my interpretation.    COORDINATION OF CARE:  6:33 PM- Discussed treatment plan with patient, and the patient agreed to the plan. The plan includes lab work and BP medication.   7:55 PM- Rechecked pt. Pt watching television with her family. Her BP is currently 195/107. Will also provide refills of the pt's BP prescriptions and give her referrals for a PCP.    Labs Review Labs Reviewed  CBC WITH DIFFERENTIAL - Abnormal; Notable for the following:    Lymphocytes Relative 47 (*)    Lymphs Abs 4.5 (*)    All other components within normal limits  COMPREHENSIVE METABOLIC PANEL - Abnormal; Notable for the following:    Glucose, Bld 118 (*)    Total Bilirubin <0.2 (*)    All other components within normal limits  TROPONIN I    Imaging Review No results found.   EKG Interpretation   Date/Time:  Friday January 25 2014 18:26:04 EDT Ventricular Rate:  82 PR Interval:  167 QRS Duration: 91 QT Interval:  444 QTC Calculation: 519 R Axis:   40 Text  Interpretation:  Sinus rhythm Prolonged QT interval Confirmed by  Beau Fanny  MD, Hansini Clodfelter ([id number]) on 01/25/2014 6:35:31 PM      MDM   Final diagnoses:  None    Patient presents with complaints of elevated blood pressure and headache. She is new to the area and has been off her antihypertensives for the past 2 weeks. She denies having struck her head. She denies any shortness of breath, but does admit to intermittent discomfort in her chest. Workup today reveals no abnormality in her renal function and troponin is negative. He is given a dose of labetalol while in the ER her blood pressure has improved somewhat. At this point I feel as though she is appropriate for discharge. She will be given a resource guide with which to assist her find a primary doctor locally.  I personally performed the services described in this documentation, which was scribed in my presence. The recorded information has been reviewed and is accurate.      Veryl Speak, MD 01/25/14 2001

## 2014-01-25 NOTE — ED Notes (Signed)
Recently moved to area - has been out of bp meds x 1 month.  Reports headache and chest pain x 4 days.

## 2014-01-25 NOTE — Discharge Instructions (Signed)
Your regular medications have been represcribed for you. Continue to monitor your blood pressures and keep a record of them.  The resource guide has been provided to you. This information may assist you in obtaining a local primary Dr.   Hypertension Hypertension, commonly called high blood pressure, is when the force of blood pumping through your arteries is too strong. Your arteries are the blood vessels that carry blood from your heart throughout your body. A blood pressure reading consists of a higher number over a lower number, such as 110/72. The higher number (systolic) is the pressure inside your arteries when your heart pumps. The lower number (diastolic) is the pressure inside your arteries when your heart relaxes. Ideally you want your blood pressure below 120/80. Hypertension forces your heart to work harder to pump blood. Your arteries may become narrow or stiff. Having hypertension puts you at risk for heart disease, stroke, and other problems.  RISK FACTORS Some risk factors for high blood pressure are controllable. Others are not.  Risk factors you cannot control include:   Race. You may be at higher risk if you are African American.  Age. Risk increases with age.  Gender. Men are at higher risk than women before age 13 years. After age 67, women are at higher risk than men. Risk factors you can control include:  Not getting enough exercise or physical activity.  Being overweight.  Getting too much fat, sugar, calories, or salt in your diet.  Drinking too much alcohol. SIGNS AND SYMPTOMS Hypertension does not usually cause signs or symptoms. Extremely high blood pressure (hypertensive crisis) may cause headache, anxiety, shortness of breath, and nosebleed. DIAGNOSIS  To check if you have hypertension, your health care provider will measure your blood pressure while you are seated, with your arm held at the level of your heart. It should be measured at least twice using  the same arm. Certain conditions can cause a difference in blood pressure between your right and left arms. A blood pressure reading that is higher than normal on one occasion does not mean that you need treatment. If one blood pressure reading is high, ask your health care provider about having it checked again. TREATMENT  Treating high blood pressure includes making lifestyle changes and possibly taking medicine. Living a healthy lifestyle can help lower high blood pressure. You may need to change some of your habits. Lifestyle changes may include:  Following the DASH diet. This diet is high in fruits, vegetables, and whole grains. It is low in salt, red meat, and added sugars.  Getting at least 2 hours of brisk physical activity every week.  Losing weight if necessary.  Not smoking.  Limiting alcoholic beverages.  Learning ways to reduce stress. If lifestyle changes are not enough to get your blood pressure under control, your health care provider may prescribe medicine. You may need to take more than one. Work closely with your health care provider to understand the risks and benefits. HOME CARE INSTRUCTIONS  Have your blood pressure rechecked as directed by your health care provider.   Take medicines only as directed by your health care provider. Follow the directions carefully. Blood pressure medicines must be taken as prescribed. The medicine does not work as well when you skip doses. Skipping doses also puts you at risk for problems.   Do not smoke.   Monitor your blood pressure at home as directed by your health care provider. SEEK MEDICAL CARE IF:   You think you  are having a reaction to medicines taken.  You have recurrent headaches or feel dizzy.  You have swelling in your ankles.  You have trouble with your vision. SEEK IMMEDIATE MEDICAL CARE IF:  You develop a severe headache or confusion.  You have unusual weakness, numbness, or feel faint.  You have  severe chest or abdominal pain.  You vomit repeatedly.  You have trouble breathing. MAKE SURE YOU:   Understand these instructions.  Will watch your condition.  Will get help right away if you are not doing well or get worse. Document Released: 05/24/2005 Document Revised: 10/08/2013 Document Reviewed: 03/16/2013 [facility] Patient Information 2015 [place], [place]. This information is not intended to replace advice given to you by your health care provider. Make sure you discuss any questions you have with your health care provider.    Emergency Department Resource Guide 1) Find a Doctor and Pay Out of Pocket Although you won't have to find out who is covered by your insurance plan, it is a good idea to ask around and get recommendations. You will then need to call the office and see if the doctor you have chosen will accept you as a new patient and what types of options they offer for patients who are self-pay. Some doctors offer discounts or will set up payment plans for their patients who do not have insurance, but you will need to ask so you aren't surprised when you get to your appointment.  2) Contact Your Local Health Department Not all health departments have doctors that can see patients for sick visits, but many do, so it is worth a call to see if yours does. If you don't know where your local health department is, you can check in your phone book. The CDC also has a tool to help you locate your state's health department, and many state websites also have listings of all of their local health departments.  3) Find a [facility] If your illness is not likely to be very severe or complicated, you may want to try a walk in clinic. These are popping up all over the country in pharmacies, drugstores, and shopping centers. They're usually staffed by nurse practitioners or physician assistants that have been trained to treat common illnesses and complaints. They're usually fairly quick  and inexpensive. However, if you have serious medical issues or chronic medical problems, these are probably not your best option.  No Primary Care Doctor: - Call Health Connect at  [phone] - they can help you locate a primary care doctor that  accepts your insurance, provides certain services, etc. - Physician Referral Service- [phone]  Chronic Pain Problems: Organization         Address  Phone   Notes  [facility]  [phone] Patients need to be referred by their primary care doctor.   Medication Assistance: Organization         Address  Phone   Notes  [facility] [address] [place], [place] [address] [phone] --Must be a resident of [facility] -- Must have NO insurance coverage whatsoever (no Medicaid/ Medicare, etc.) -- The pt. MUST have a primary care doctor that directs their care regularly and follows them in the community   [facility]  [phone]   [employer]  [phone]    Agencies that provide inexpensive medical care: Organization         Address  Phone   Notes  [facility]  [phone]   [facility]    [phone]   [facility] [place], [place] [address] [phone]   [place] [address] [place] [phone]   Planned Parenthood    [phone]   [facility]    [phone]   [place] and [address], [place] Phone:  [phone], Fax:  [phone] Hours of Operation:  9 am - 6 pm, M-F.  Also accepts Medicaid/Medicare and self-pay.  [facility] for [place], [address], [place] Phone: [phone], Fax: [phone]. Hours of Operation:  8:30 am - 5:30 pm, M-F.  Also accepts Medicaid and self-pay.  [facility] [place] [address], [place] Phone: [phone]    [place], [place], [place] [phone], Ext. [phone] Mondays & Thursdays: 7-9 AM.  First 15 patients are seen on a first come, first serve basis.    [place] Providers:  Organization         Address  Phone   Notes  [facility] [address], [place] [phone] Also accepts self-pay patients.  [facility] [address], [place]  [phone]   [place], [address], [place] [phone]   [facility] [address], [place] [phone]   Lucianne Lei [address], [place]   [phone] Only accepts [place] Access [place] patients after they have their name applied to their card.   Self-Pay (no insurance) in [facility]:  Organization         Address  Phone   Notes  Sickle Cell Patients, [facility] [place] [phone]   [facility] [place] [phone]   [facility] [place]  [place], [place], [place] [phone]   [facility]/Dr. Osei-Bonsu  [address], [place] or [address], [place] [phone] Phone number for both [place] and [place] locations is the same.  [facility] [address] [place] [phone]   [facility] [address], [place] or [address] [phone]   [facility] [address], [place] [phone], phone; [phone], fax Sees patients 1st and 3rd Saturday of every month.  Must not qualify for public or private insurance (i.e. Medicaid, Medicare, [employer] Health Choice, Veterans' Benefits)  Household income should be no more than 200% of the poverty level The clinic cannot treat you if you are  pregnant or think you are pregnant  Sexually transmitted diseases are not treated at the clinic.    Dental Care: Organization         Address  Phone  Notes  [facility] Department of [facility] [phone] Accepts children up to age 21 who are enrolled in [place] or [place]; pregnant women with a Medicaid card; and children who have applied for Medicaid or [place] Health Choice, but were declined, whose parents can pay a reduced fee at time of service.  [place] Department of  [facility]  [address], [place] [phone] Accepts children up to age 10 who are enrolled in Medicaid or [place]; pregnant women with a Medicaid card; and children who have applied for Medicaid or [place] Health Choice, but were declined, whose parents can pay a reduced fee at time of service.  [facility] Adult [facility] Access PROGRAM  [place] [phone] Patients are seen by appointment only. Walk-ins are not accepted. [place] will see patients 59 years of age and older. Monday - Tuesday (8am-5pm) Most Wednesdays (8:30-5pm) $30 per visit, cash only  [facility] Adult Dental Access PROGRAM  [address], [facility] [phone] Patients are seen by appointment only. Walk-ins are not accepted. [place] will see patients 32 years of age and older. One Wednesday Evening (Monthly: Volunteer Based).  $30 per visit, cash only  [place]  [phone] for adults; Children under age 37, call Graduate Pediatric Dentistry at [phone]. Children aged 71-14, please call [phone] to request a pediatric application.  Dental services are provided in all areas of dental care including fillings, crowns and bridges, complete and partial dentures, implants, gum treatment, root canals, and extractions. Preventive care is also provided. Treatment is provided to  both adults and children. Patients are selected via a lottery and there is often a waiting list.   [facility] [address], [place]  [phone] [email or web address]   [facility] [address] [place], [place] [phone], Ext. [phone] Second and Fourth Thursday of each month, opens at 6:30 AM; Clinic ends at 9 AM.  Patients are seen on a first-come first-served basis, and a limited number are seen during each clinic.   [facility]  [address] Hillard Danker [place], [place] [phone]   Eligibility Requirements You must have lived in [place], [place], or [place] counties for at least the last three months.   You cannot be eligible for state or federal sponsored [employer], including [employer], [place], or [employer].   You generally cannot be eligible for healthcare insurance through your employer.    How to apply: Eligibility screenings are held every Tuesday and Wednesday afternoon from 1:00 pm until 4:00 pm. You do not need an appointment for the interview!  [facility] [address], [place], [place]   [place]  [place] Department  [place]  [phone]    Behavioral Health Resources in the Community: Intensive Outpatient Programs Organization         Address  Phone  Notes  [place]. [address], [place], [place] [phone]   [facility]patient [address], [place], [place]   ADS: Alcohol & Drug Svcs [address], [place], [place]   [place] [address],  [place], [place] or [phone]   Substance Abuse Resources Organization         Address  Phone  Notes  Alcohol and Drug Services  [phone]   [place]  [phone]   [facility] [place]   Chinita Pester  [phone]   Residential & Outpatient Substance Abuse Program  [phone]   Psychological Services Organization         Address  Phone  Notes  [place]  [place]   [place] [address] [place] or [phone]    Mobile Crisis Teams Organization         Address  Phone  Notes  [facility], Mobile Crisis Care Unit  [phone]   [facility]  [address] [place], [place]   [place] [address], [place] [phone]    Self-Help/Support Groups Organization         Address  Phone             Notes  [place]. of [place] - variety of support groups  [place] Call for more information  Narcotics Anonymous (NA), [facility] [address], [employer] [place]  2 meetings at this location   Special educational needs teacher         Address  Phone  Notes  [facility] [place],    [place]  [phone]   [facility]  [address], [place] [phone], [place], [place]   [place], [place] [phone] Admissions: 8am-3pm M-F  Incentives Substance [place] [address],    [place], [place] [phone]   [facility] [address] [place], [place], [place]   [facility] [address],  [place], [place]   [facility] - Intensive Outpatient [address], [place], [place]   [facility] [place].,  [place], [place] [phone] or [phone]   [facility] (RTS) [address], [place], [place] Accepts Medicaid  [facility] [place] [address]  [place] [phone] Substance Abuse/Addiction Treatment   [facility] Organization         Address  Phone  Notes  [facility]  [phone]   Domenic Schwab, PhD [address] Arlis Porta [place], [place]   [phone] or [phone]   [place], [place] [phone]   [facility] [address] [place], [place] [phone] Insurance/Medicaid/sponsorship through [facility] and Families [address]                                    [place], [place] [phone] [place] [address], [place] [phone]    Dr. Adele Schilder  [phone]   [facility] Dept. 1) [address] [place] 2) [place] 3)  [place] [address], [place] [phone]  [phone]   [place] [phone] or [phone] (After Hours)

## 2014-02-10 ENCOUNTER — Encounter (HOSPITAL_COMMUNITY): Payer: Self-pay | Admitting: Emergency Medicine

## 2014-02-10 ENCOUNTER — Emergency Department (HOSPITAL_COMMUNITY)
Admission: EM | Admit: 2014-02-10 | Discharge: 2014-02-10 | Disposition: A | Discharge status: Home / Self Care | Payer: Medicare Other | Attending: Emergency Medicine | Admitting: Emergency Medicine

## 2014-02-10 DIAGNOSIS — F17200 Nicotine dependence, unspecified, uncomplicated: Secondary | ICD-10-CM | POA: Insufficient documentation

## 2014-02-10 DIAGNOSIS — M545 Low back pain, unspecified: Secondary | ICD-10-CM | POA: Insufficient documentation

## 2014-02-10 DIAGNOSIS — F319 Bipolar disorder, unspecified: Secondary | ICD-10-CM | POA: Diagnosis not present

## 2014-02-10 DIAGNOSIS — S335XXA Sprain of ligaments of lumbar spine, initial encounter: Secondary | ICD-10-CM | POA: Diagnosis not present

## 2014-02-10 DIAGNOSIS — Z79899 Other long term (current) drug therapy: Secondary | ICD-10-CM | POA: Insufficient documentation

## 2014-02-10 DIAGNOSIS — Z880 Allergy status to penicillin: Secondary | ICD-10-CM | POA: Diagnosis not present

## 2014-02-10 DIAGNOSIS — I2510 Atherosclerotic heart disease of native coronary artery without angina pectoris: Secondary | ICD-10-CM | POA: Diagnosis not present

## 2014-02-10 DIAGNOSIS — I10 Essential (primary) hypertension: Secondary | ICD-10-CM | POA: Insufficient documentation

## 2014-02-10 DIAGNOSIS — Y929 Unspecified place or not applicable: Secondary | ICD-10-CM | POA: Insufficient documentation

## 2014-02-10 DIAGNOSIS — M549 Dorsalgia, unspecified: Secondary | ICD-10-CM

## 2014-02-10 DIAGNOSIS — IMO0002 Reserved for concepts with insufficient information to code with codable children: Secondary | ICD-10-CM | POA: Insufficient documentation

## 2014-02-10 DIAGNOSIS — K219 Gastro-esophageal reflux disease without esophagitis: Secondary | ICD-10-CM | POA: Insufficient documentation

## 2014-02-10 DIAGNOSIS — J449 Chronic obstructive pulmonary disease, unspecified: Secondary | ICD-10-CM | POA: Insufficient documentation

## 2014-02-10 DIAGNOSIS — Y9389 Activity, other specified: Secondary | ICD-10-CM | POA: Diagnosis not present

## 2014-02-10 DIAGNOSIS — Z8739 Personal history of other diseases of the musculoskeletal system and connective tissue: Secondary | ICD-10-CM

## 2014-02-10 DIAGNOSIS — J4489 Other specified chronic obstructive pulmonary disease: Secondary | ICD-10-CM | POA: Diagnosis not present

## 2014-02-10 DIAGNOSIS — X500XXA Overexertion from strenuous movement or load, initial encounter: Secondary | ICD-10-CM | POA: Diagnosis not present

## 2014-02-10 DIAGNOSIS — T148XXA Other injury of unspecified body region, initial encounter: Secondary | ICD-10-CM

## 2014-02-10 DIAGNOSIS — G8929 Other chronic pain: Secondary | ICD-10-CM | POA: Diagnosis not present

## 2014-02-10 DIAGNOSIS — E119 Type 2 diabetes mellitus without complications: Secondary | ICD-10-CM | POA: Diagnosis not present

## 2014-02-10 DIAGNOSIS — I252 Old myocardial infarction: Secondary | ICD-10-CM | POA: Insufficient documentation

## 2014-02-10 DIAGNOSIS — E039 Hypothyroidism, unspecified: Secondary | ICD-10-CM | POA: Insufficient documentation

## 2014-02-10 DIAGNOSIS — S338XXA Sprain of other parts of lumbar spine and pelvis, initial encounter: Secondary | ICD-10-CM | POA: Diagnosis not present

## 2014-02-10 MED ORDER — PREDNISONE 50 MG PO TABS
60.0000 mg | ORAL_TABLET | Freq: Once | ORAL | Status: AC
  Administered 2014-02-10: 60 mg via ORAL
  Filled 2014-02-10 (×2): qty 1

## 2014-02-10 MED ORDER — ACETAMINOPHEN-CODEINE #3 300-30 MG PO TABS
2.0000 | ORAL_TABLET | Freq: Once | ORAL | Status: AC
  Administered 2014-02-10: 2 via ORAL
  Filled 2014-02-10: qty 2

## 2014-02-10 MED ORDER — ONDANSETRON HCL 4 MG PO TABS
4.0000 mg | ORAL_TABLET | Freq: Once | ORAL | Status: AC
  Administered 2014-02-10: 4 mg via ORAL
  Filled 2014-02-10: qty 1

## 2014-02-10 MED ORDER — DEXAMETHASONE 6 MG PO TABS: ORAL_TABLET | ORAL | Status: DC

## 2014-02-10 MED ORDER — METHOCARBAMOL 500 MG PO TABS: 500.0000 mg | ORAL_TABLET | Freq: Three times a day (TID) | ORAL | Status: DC

## 2014-02-10 MED ORDER — ACETAMINOPHEN-CODEINE #3 300-30 MG PO TABS: 1.0000 | ORAL_TABLET | Freq: Four times a day (QID) | ORAL | Status: DC | PRN

## 2014-02-10 MED ORDER — METHOCARBAMOL 500 MG PO TABS
1000.0000 mg | ORAL_TABLET | Freq: Once | ORAL | Status: AC
  Administered 2014-02-10: 1000 mg via ORAL
  Filled 2014-02-10: qty 2

## 2014-02-10 NOTE — Discharge Instructions (Signed)
Muscle Strain A muscle strain (pulled muscle) happens when a muscle is stretched beyond normal length. It happens when a sudden, violent force stretches your muscle too far. Usually, a few of the fibers in your muscle are torn. Muscle strain is common in athletes. Recovery usually takes 1-2 weeks. Complete healing takes 5-6 weeks.  HOME CARE   Follow the PRICE method of treatment to help your injury get better. Do this the first 2-3 days after the injury:  Protect. Protect the muscle to keep it from getting injured again.  Rest. Limit your activity and rest the injured body part.  Ice. Put ice in a plastic bag. Place a towel between your skin and the bag. Then, apply the ice and leave it on from 15-20 minutes each hour. After the third day, switch to moist heat packs.  Compression. Use a splint or elastic bandage on the injured area for comfort. Do not put it on too tightly.  Elevate. Keep the injured body part above the level of your heart.  Only take medicine as told by your doctor.  Warm up before doing exercise to prevent future muscle strains. GET HELP IF:   You have more pain or puffiness (swelling) in the injured area.  You feel numbness, tingling, or notice a loss of strength in the injured area. MAKE SURE YOU:   Understand these instructions.  Will watch your condition.  Will get help right away if you are not doing well or get worse. Document Released: 03/02/2008 Document Revised: 03/14/2013 Document Reviewed: 12/21/2012 [facility] Patient Information 2015 [place], [place]. This information is not intended to replace advice given to you by your health care provider. Make sure you discuss any questions you have with your health care provider.  Back Pain, Adult Back pain is very common. The pain often gets better over time. The cause of back pain is usually not dangerous. Most people can learn to manage their back pain on their own.  HOME CARE   Stay active. Start with  short walks on flat ground if you can. Try to walk farther each day.  Do not sit, drive, or stand in one place for more than 30 minutes. Do not stay in bed.  Do not avoid exercise or work. Activity can help your back heal faster.  Be careful when you bend or lift an object. Bend at your knees, keep the object close to you, and do not twist.  Sleep on a firm mattress. Lie on your side, and bend your knees. If you lie on your back, put a pillow under your knees.  Only take medicines as told by your doctor.  Put ice on the injured area.  Put ice in a plastic bag.  Place a towel between your skin and the bag.  Leave the ice on for 15-20 minutes, 03-04 times a day for the first 2 to 3 days. After that, you can switch between ice and heat packs.  Ask your doctor about back exercises or massage.  Avoid feeling anxious or stressed. Find good ways to deal with stress, such as exercise. GET HELP RIGHT AWAY IF:   Your pain does not go away with rest or medicine.  Your pain does not go away in 1 week.  You have new problems.  You do not feel well.  The pain spreads into your legs.  You cannot control when you poop (bowel movement) or pee (urinate).  Your arms or legs feel weak or lose feeling (numbness).  You feel sick to your stomach (nauseous) or throw up (vomit).  You have belly (abdominal) pain.  You feel like you may pass out (faint). MAKE SURE YOU:   Understand these instructions.  Will watch your condition.  Will get help right away if you are not doing well or get worse. Document Released: 11/10/2007 Document Revised: 08/16/2011 Document Reviewed: 09/25/2013 [facility] Patient Information 2015 [place], [place]. This information is not intended to replace advice given to you by your health care provider. Make sure you discuss any questions you have with your health care provider.

## 2014-02-10 NOTE — ED Notes (Signed)
Pt went to "pick something up" when she heard "loud snaps" and immediately felt pain. Pt states pain goes from shoulder blades to all the way down rt leg. Hx of spinal fusion with cage and rods.

## 2014-02-10 NOTE — ED Provider Notes (Signed)
CSN: [id number]     Arrival date & time 02/10/14  1749 History  This chart was scribed for non-physician practitioner, Lily Kocher, PA-C,working with Babette Relic, MD, by Marlowe Kays, ED Scribe. This patient was seen in room APFT23/APFT23 and the patient's care was started at 6:10 PM.  Chief Complaint  Patient presents with  . Back Pain   The history is provided by the patient. No language interpreter was used.   HPI Comments:  Katrina [name] is a 53 y.o. female with PMH of HTN, DM, COPD and bipolar disorder who presents to the Emergency Department complaining of sudden onset of severe back pain from her shoulder blades down to her right leg onset earlier today. Pt states she bent over to pick up something and heard what she describes as "twigs snapping". She describes the pain as burning. She reports having spinal fusion surgery 10 years ago. Pt re-injured her back 7 months ago when she slipped and fell on ice and now reports intermittent use of a cane. She just moved here from [place] and has not established care with a PCP or orthopedist. She was in a pain management clinic in [place] but has not established a relationship with one here yet either. She is supposed to use the cane all the time but she states she does not. She denies numbness or weakness of the lower extremities, bowel or bladder incontinence.   Past Medical History  Diagnosis Date  . Diabetes mellitus   . Hypertension   . Hyperlipidemia   . COPD (chronic obstructive pulmonary disease)   . Reflux   . Myocardial infarction   . CAD (coronary artery disease)   . Hypothyroid   . Lung nodules   . Diverticulosis   . Bipolar affective   . Normal cardiac stress test 09/2013   Past Surgical History  Procedure Laterality Date  . Back surgery    . Abdominal hysterectomy    . Appendectomy    . Tonsillectomy    . Ankle surgery    . Cholecystectomy    . Nose surgery     Family History  Problem Relation Age of Onset   . Coronary artery disease Father 70  . Emphysema Father    History  Substance Use Topics  . Smoking status: Current Every Day Smoker -- 1.00 packs/day for 40 years    Types: Cigarettes  . Smokeless tobacco: Not on file  . Alcohol Use: No   OB History   Grav Para Term Preterm Abortions TAB SAB Ect Mult Living                 Review of Systems  Musculoskeletal: Positive for back pain.  Neurological: Negative for weakness and numbness.  All other systems reviewed and are negative.   Allergies  Penicillins; Pineapple; Strawberry; and Aspirin  Home Medications   Prior to Admission medications   Medication Sig Start Date End Date Taking? Authorizing Provider  acetaminophen-codeine (TYLENOL #3) 300-30 MG per tablet Take 1-2 tablets by mouth every 6 (six) hours as needed. 02/10/14   Lenox Ahr, PA-C  carisoprodol (SOMA) 350 MG tablet Take 350 mg by mouth 3 (three) times daily.    Historical Provider, MD  cloNIDine (CATAPRES - DOSED IN MG/24 HR) 0.3 mg/24hr patch Place 1 patch (0.3 mg total) onto the skin once a week. Sundays 01/25/14   Veryl Speak, MD  dexamethasone (DECADRON) 6 MG tablet 1 po bid with food 02/10/14   Evorn Gong  Marshell Levan, PA-C  dexlansoprazole (DEXILANT) 60 MG capsule Take 1 capsule (60 mg total) by mouth every evening. 01/25/14   Veryl Speak, MD  EPINEPHrine (EPI-PEN) 0.3 mg/0.3 mL DEVI Inject 0.3 mg into the muscle once.    Historical Provider, MD  furosemide (LASIX) 20 MG tablet Take 1 tablet (20 mg total) by mouth daily as needed for fluid. 01/25/14   Veryl Speak, MD  gabapentin (NEURONTIN) 600 MG tablet Take 1 tablet (600 mg total) by mouth 3 (three) times daily. 01/25/14   Veryl Speak, MD  hydrALAZINE (APRESOLINE) 100 MG tablet Take 1 tablet (100 mg total) by mouth 2 (two) times daily. 01/25/14   Veryl Speak, MD  isosorbide mononitrate (IMDUR) 60 MG 24 hr tablet Take 1 tablet (60 mg total) by mouth every evening. 01/25/14   Veryl Speak, MD  labetalol (NORMODYNE)  300 MG tablet Take 1 tablet (300 mg total) by mouth 2 (two) times daily. 01/25/14   Veryl Speak, MD  levothyroxine (SYNTHROID, LEVOTHROID) 50 MCG tablet Take 1 tablet (50 mcg total) by mouth at bedtime. 01/25/14   Veryl Speak, MD  methocarbamol (ROBAXIN) 500 MG tablet Take 1 tablet (500 mg total) by mouth 3 (three) times daily. 02/10/14   Lenox Ahr, PA-C  oxyCODONE-acetaminophen (PERCOCET/ROXICET) 5-325 MG per tablet Take 1 tablet by mouth every 4 (four) hours as needed for severe pain (for pain).    Historical Provider, MD  rOPINIRole (REQUIP) 3 MG tablet Take 1 tablet (3 mg total) by mouth at bedtime. 01/25/14   Veryl Speak, MD  triamterene-hydrochlorothiazide (DYAZIDE) 37.5-25 MG per capsule Take 1 each (1 capsule total) by mouth every morning. 01/25/14   Veryl Speak, MD  zolpidem (AMBIEN) 10 MG tablet Take 10 mg by mouth at bedtime.    Historical Provider, MD   Triage Vitals: BP 192/99  Pulse 82  Temp(Src) 98.3 F (36.8 C) (Oral)  Resp 20  Ht 5\' 7"  (1.702 m)  Wt 216 lb (97.977 kg)  BMI 33.82 kg/m2  SpO2 98% Physical Exam  Nursing note and vitals reviewed. Constitutional: She is oriented to person, place, and time. She appears well-developed and well-nourished.  HENT:  Head: Normocephalic and atraumatic.  Eyes: EOM are normal.  Neck: Normal range of motion.  Cardiovascular: Normal rate.   Pulmonary/Chest: Effort normal.  Musculoskeletal: Normal range of motion. She exhibits tenderness.  Paraspinal tenderness of thoracic spine. Right and left paraspinal tenderness to lumbar spine.  Neurological: She is alert and oriented to person, place, and time.  Ambulation with cane. No foot drop. No gross motor sensory deficits.  Skin: Skin is warm and dry.  Psychiatric: She has a normal mood and affect. Her behavior is normal.    ED Course  Procedures (including critical care time) DIAGNOSTIC STUDIES: Oxygen Saturation is 98% on RA, normal by my interpretation.   COORDINATION OF  CARE: 6:24 PM- Will provide information for pt to establish care with a PCP. Will order pain medication and advised pt to rest and apply heat. Pt verbalizes understanding and agrees to plan.  Medications  predniSONE (DELTASONE) tablet 60 mg (60 mg Oral Given 02/10/14 1836)  methocarbamol (ROBAXIN) tablet 1,000 mg (1,000 mg Oral Given 02/10/14 1837)  acetaminophen-codeine (TYLENOL #3) 300-30 MG per tablet 2 tablet (2 tablets Oral Given 02/10/14 1836)  ondansetron (ZOFRAN) tablet 4 mg (4 mg Oral Given 02/10/14 1836)    Labs Review Labs Reviewed - No data to display  Imaging Review No results found.   EKG Interpretation None  MDM Pt has acute on chronic back pain. No gross neuro changes noted. Rx for tylenol codeine, robaxin, and decadron given to the patient. Pt given info on the adult med cliniic and Dr O'Toole(pain management).   Final diagnoses:  Muscle strain  Bilateral back pain, unspecified location  Hx of degenerative disc disease    *I have reviewed nursing notes, vital signs, and all appropriate lab and imaging results for this patient.*  I personally performed the services described in this documentation, which was scribed in my presence. The recorded information has been reviewed and is accurate.    Lenox Ahr, PA-C 02/10/14 1859

## 2014-02-11 NOTE — ED Provider Notes (Signed)
Medical screening examination/treatment/procedure(s) were performed by non-physician practitioner and as supervising physician I was immediately available for consultation/collaboration.   EKG Interpretation None       Babette Relic, MD 02/11/14 2154

## 2014-02-27 DIAGNOSIS — S0990XA Unspecified injury of head, initial encounter: Secondary | ICD-10-CM | POA: Diagnosis not present

## 2014-02-27 DIAGNOSIS — M25569 Pain in unspecified knee: Secondary | ICD-10-CM | POA: Diagnosis not present

## 2014-02-27 DIAGNOSIS — S8990XA Unspecified injury of unspecified lower leg, initial encounter: Secondary | ICD-10-CM | POA: Diagnosis not present

## 2014-02-27 DIAGNOSIS — R51 Headache: Secondary | ICD-10-CM | POA: Diagnosis not present

## 2014-02-27 DIAGNOSIS — I10 Essential (primary) hypertension: Secondary | ICD-10-CM | POA: Diagnosis not present

## 2014-02-27 DIAGNOSIS — S336XXA Sprain of sacroiliac joint, initial encounter: Secondary | ICD-10-CM | POA: Diagnosis not present

## 2014-02-27 DIAGNOSIS — S99919A Unspecified injury of unspecified ankle, initial encounter: Secondary | ICD-10-CM | POA: Diagnosis not present

## 2014-02-27 DIAGNOSIS — IMO0002 Reserved for concepts with insufficient information to code with codable children: Secondary | ICD-10-CM | POA: Diagnosis not present

## 2014-02-27 DIAGNOSIS — M545 Low back pain, unspecified: Secondary | ICD-10-CM | POA: Diagnosis not present

## 2014-02-27 DIAGNOSIS — F17200 Nicotine dependence, unspecified, uncomplicated: Secondary | ICD-10-CM | POA: Diagnosis not present

## 2014-02-27 DIAGNOSIS — M79609 Pain in unspecified limb: Secondary | ICD-10-CM | POA: Diagnosis not present

## 2014-03-04 ENCOUNTER — Other Ambulatory Visit (HOSPITAL_COMMUNITY): Payer: Self-pay | Admitting: Internal Medicine

## 2014-03-04 DIAGNOSIS — Z1231 Encounter for screening mammogram for malignant neoplasm of breast: Secondary | ICD-10-CM

## 2014-03-04 DIAGNOSIS — R229 Localized swelling, mass and lump, unspecified: Secondary | ICD-10-CM

## 2014-03-04 DIAGNOSIS — R928 Other abnormal and inconclusive findings on diagnostic imaging of breast: Secondary | ICD-10-CM

## 2014-03-04 DIAGNOSIS — IMO0002 Reserved for concepts with insufficient information to code with codable children: Secondary | ICD-10-CM

## 2014-03-04 DIAGNOSIS — Z09 Encounter for follow-up examination after completed treatment for conditions other than malignant neoplasm: Secondary | ICD-10-CM

## 2014-03-04 DIAGNOSIS — Z0000 Encounter for general adult medical examination without abnormal findings: Secondary | ICD-10-CM | POA: Diagnosis not present

## 2014-03-06 ENCOUNTER — Other Ambulatory Visit: Payer: Self-pay

## 2014-03-06 ENCOUNTER — Ambulatory Visit (HOSPITAL_COMMUNITY)
Admission: RE | Admit: 2014-03-06 | Discharge: 2014-03-06 | Disposition: A | Discharge status: Home / Self Care | Payer: Medicare Other | Source: Ambulatory Visit | Attending: Internal Medicine | Admitting: Internal Medicine

## 2014-03-06 DIAGNOSIS — I10 Essential (primary) hypertension: Secondary | ICD-10-CM | POA: Diagnosis not present

## 2014-03-06 DIAGNOSIS — E669 Obesity, unspecified: Secondary | ICD-10-CM | POA: Diagnosis not present

## 2014-03-06 DIAGNOSIS — I2510 Atherosclerotic heart disease of native coronary artery without angina pectoris: Secondary | ICD-10-CM | POA: Diagnosis not present

## 2014-03-06 DIAGNOSIS — M549 Dorsalgia, unspecified: Secondary | ICD-10-CM | POA: Diagnosis not present

## 2014-03-06 DIAGNOSIS — R7309 Other abnormal glucose: Secondary | ICD-10-CM | POA: Diagnosis not present

## 2014-03-07 ENCOUNTER — Encounter (HOSPITAL_COMMUNITY): Payer: Medicare Other

## 2014-03-12 ENCOUNTER — Ambulatory Visit (HOSPITAL_COMMUNITY)
Admission: RE | Admit: 2014-03-12 | Discharge: 2014-03-12 | Disposition: A | Discharge status: Home / Self Care | Payer: Medicare Other | Source: Ambulatory Visit | Attending: Internal Medicine | Admitting: Internal Medicine

## 2014-03-12 DIAGNOSIS — R928 Other abnormal and inconclusive findings on diagnostic imaging of breast: Secondary | ICD-10-CM | POA: Insufficient documentation

## 2014-03-18 ENCOUNTER — Encounter (HOSPITAL_COMMUNITY): Payer: Self-pay | Admitting: Emergency Medicine

## 2014-03-18 ENCOUNTER — Emergency Department (HOSPITAL_COMMUNITY): Payer: Medicare Other

## 2014-03-18 ENCOUNTER — Inpatient Hospital Stay (HOSPITAL_COMMUNITY)
Admission: EM | Admit: 2014-03-18 | Discharge: 2014-03-20 | DRG: 948 | Disposition: A | Discharge status: Home / Self Care | Payer: Medicare Other | Attending: Internal Medicine | Admitting: Internal Medicine

## 2014-03-18 DIAGNOSIS — R29810 Facial weakness: Secondary | ICD-10-CM | POA: Diagnosis present

## 2014-03-18 DIAGNOSIS — R079 Chest pain, unspecified: Secondary | ICD-10-CM | POA: Diagnosis not present

## 2014-03-18 DIAGNOSIS — R269 Unspecified abnormalities of gait and mobility: Secondary | ICD-10-CM | POA: Diagnosis not present

## 2014-03-18 DIAGNOSIS — J9811 Atelectasis: Secondary | ICD-10-CM | POA: Diagnosis not present

## 2014-03-18 DIAGNOSIS — I519 Heart disease, unspecified: Secondary | ICD-10-CM | POA: Diagnosis not present

## 2014-03-18 DIAGNOSIS — I252 Old myocardial infarction: Secondary | ICD-10-CM

## 2014-03-18 DIAGNOSIS — Z825 Family history of asthma and other chronic lower respiratory diseases: Secondary | ICD-10-CM

## 2014-03-18 DIAGNOSIS — R202 Paresthesia of skin: Secondary | ICD-10-CM | POA: Diagnosis present

## 2014-03-18 DIAGNOSIS — G35 Multiple sclerosis: Secondary | ICD-10-CM

## 2014-03-18 DIAGNOSIS — E039 Hypothyroidism, unspecified: Secondary | ICD-10-CM | POA: Diagnosis present

## 2014-03-18 DIAGNOSIS — E785 Hyperlipidemia, unspecified: Secondary | ICD-10-CM | POA: Diagnosis present

## 2014-03-18 DIAGNOSIS — R0600 Dyspnea, unspecified: Secondary | ICD-10-CM

## 2014-03-18 DIAGNOSIS — E119 Type 2 diabetes mellitus without complications: Secondary | ICD-10-CM | POA: Diagnosis present

## 2014-03-18 DIAGNOSIS — R55 Syncope and collapse: Secondary | ICD-10-CM | POA: Diagnosis not present

## 2014-03-18 DIAGNOSIS — I6350 Cerebral infarction due to unspecified occlusion or stenosis of unspecified cerebral artery: Secondary | ICD-10-CM | POA: Diagnosis not present

## 2014-03-18 DIAGNOSIS — F17210 Nicotine dependence, cigarettes, uncomplicated: Secondary | ICD-10-CM | POA: Diagnosis present

## 2014-03-18 DIAGNOSIS — IMO0001 Reserved for inherently not codable concepts without codable children: Secondary | ICD-10-CM | POA: Diagnosis present

## 2014-03-18 DIAGNOSIS — K219 Gastro-esophageal reflux disease without esophagitis: Secondary | ICD-10-CM | POA: Diagnosis present

## 2014-03-18 DIAGNOSIS — I2510 Atherosclerotic heart disease of native coronary artery without angina pectoris: Secondary | ICD-10-CM | POA: Diagnosis present

## 2014-03-18 DIAGNOSIS — G8929 Other chronic pain: Secondary | ICD-10-CM | POA: Diagnosis not present

## 2014-03-18 DIAGNOSIS — Z720 Tobacco use: Secondary | ICD-10-CM | POA: Diagnosis present

## 2014-03-18 DIAGNOSIS — Z886 Allergy status to analgesic agent status: Secondary | ICD-10-CM

## 2014-03-18 DIAGNOSIS — K279 Peptic ulcer, site unspecified, unspecified as acute or chronic, without hemorrhage or perforation: Secondary | ICD-10-CM | POA: Diagnosis present

## 2014-03-18 DIAGNOSIS — E1140 Type 2 diabetes mellitus with diabetic neuropathy, unspecified: Secondary | ICD-10-CM | POA: Diagnosis present

## 2014-03-18 DIAGNOSIS — J449 Chronic obstructive pulmonary disease, unspecified: Secondary | ICD-10-CM | POA: Diagnosis present

## 2014-03-18 DIAGNOSIS — Z8673 Personal history of transient ischemic attack (TIA), and cerebral infarction without residual deficits: Secondary | ICD-10-CM

## 2014-03-18 DIAGNOSIS — I10 Essential (primary) hypertension: Secondary | ICD-10-CM | POA: Diagnosis present

## 2014-03-18 DIAGNOSIS — Z8249 Family history of ischemic heart disease and other diseases of the circulatory system: Secondary | ICD-10-CM

## 2014-03-18 DIAGNOSIS — R531 Weakness: Secondary | ICD-10-CM | POA: Diagnosis not present

## 2014-03-18 DIAGNOSIS — R51 Headache: Secondary | ICD-10-CM | POA: Diagnosis not present

## 2014-03-18 DIAGNOSIS — Z9114 Patient's other noncompliance with medication regimen: Secondary | ICD-10-CM | POA: Diagnosis present

## 2014-03-18 DIAGNOSIS — R209 Unspecified disturbances of skin sensation: Secondary | ICD-10-CM | POA: Diagnosis present

## 2014-03-18 DIAGNOSIS — E1165 Type 2 diabetes mellitus with hyperglycemia: Secondary | ICD-10-CM

## 2014-03-18 DIAGNOSIS — I639 Cerebral infarction, unspecified: Secondary | ICD-10-CM | POA: Diagnosis not present

## 2014-03-18 DIAGNOSIS — M6289 Other specified disorders of muscle: Secondary | ICD-10-CM | POA: Diagnosis not present

## 2014-03-18 DIAGNOSIS — I679 Cerebrovascular disease, unspecified: Secondary | ICD-10-CM | POA: Diagnosis not present

## 2014-03-18 DIAGNOSIS — I059 Rheumatic mitral valve disease, unspecified: Secondary | ICD-10-CM | POA: Diagnosis not present

## 2014-03-18 DIAGNOSIS — F319 Bipolar disorder, unspecified: Secondary | ICD-10-CM | POA: Diagnosis present

## 2014-03-18 DIAGNOSIS — I5189 Other ill-defined heart diseases: Secondary | ICD-10-CM | POA: Diagnosis present

## 2014-03-18 DIAGNOSIS — IMO0002 Reserved for concepts with insufficient information to code with codable children: Secondary | ICD-10-CM

## 2014-03-18 DIAGNOSIS — Z0389 Encounter for observation for other suspected diseases and conditions ruled out: Secondary | ICD-10-CM

## 2014-03-18 DIAGNOSIS — M4802 Spinal stenosis, cervical region: Secondary | ICD-10-CM | POA: Diagnosis not present

## 2014-03-18 DIAGNOSIS — Z6833 Body mass index (BMI) 33.0-33.9, adult: Secondary | ICD-10-CM

## 2014-03-18 DIAGNOSIS — I69359 Hemiplegia and hemiparesis following cerebral infarction affecting unspecified side: Secondary | ICD-10-CM | POA: Diagnosis not present

## 2014-03-18 LAB — BASIC METABOLIC PANEL
Anion gap: 12 (ref 5–15)
BUN: 16 mg/dL (ref 6–23)
CO2: 22 mEq/L (ref 19–32)
Calcium: 8.8 mg/dL (ref 8.4–10.5)
Chloride: 106 mEq/L (ref 96–112)
Creatinine, Ser: 0.81 mg/dL (ref 0.50–1.10)
GFR calc Af Amer: >90 mL/min (ref >90)
GFR calc non Af Amer: 81 mL/min — ABNORMAL LOW (ref >90)
GLUCOSE: 120 mg/dL — AB (ref 70–99)
POTASSIUM: 3.5 meq/L — AB (ref 3.7–5.3)
Sodium: 140 mEq/L (ref 137–147)

## 2014-03-18 LAB — CBC WITH DIFFERENTIAL/PLATELET
Basophils Absolute: 0 10*3/uL (ref 0.0–0.1)
Basophils Relative: 1 % (ref 0–1)
EOS PCT: 3 % (ref 0–5)
Eosinophils Absolute: 0.3 10*3/uL (ref 0.0–0.7)
HCT: 39.5 % (ref 36.0–46.0)
HEMOGLOBIN: 13.4 g/dL (ref 12.0–15.0)
LYMPHS ABS: 4.5 10*3/uL — AB (ref 0.7–4.0)
Lymphocytes Relative: 52 % — ABNORMAL HIGH (ref 12–46)
MCH: 30 pg (ref 26.0–34.0)
MCHC: 33.9 g/dL (ref 30.0–36.0)
MCV: 88.4 fL (ref 78.0–100.0)
MONOS PCT: 6 % (ref 3–12)
Monocytes Absolute: 0.5 10*3/uL (ref 0.1–1.0)
NEUTROS PCT: 38 % — AB (ref 43–77)
Neutro Abs: 3.2 10*3/uL (ref 1.7–7.7)
Platelets: 265 10*3/uL (ref 150–400)
RBC: 4.47 MIL/uL (ref 3.87–5.11)
RDW: 14.6 % (ref 11.5–15.5)
WBC: 8.5 10*3/uL (ref 4.0–10.5)

## 2014-03-18 LAB — PROTIME-INR
INR: 1.1 (ref 0.00–1.49)
Prothrombin Time: 14.2 seconds (ref 11.6–15.2)

## 2014-03-18 LAB — TROPONIN I: Troponin I: <0.3 ng/mL (ref <0.30)

## 2014-03-18 LAB — CBG MONITORING, ED: Glucose-Capillary: 114 mg/dL — ABNORMAL HIGH (ref 70–99)

## 2014-03-18 MED ORDER — ASPIRIN 325 MG PO TABS: 325.0000 mg | ORAL_TABLET | Freq: Once | ORAL | Status: DC

## 2014-03-18 MED ORDER — ZOLPIDEM TARTRATE 5 MG PO TABS: 10.0000 mg | ORAL_TABLET | Freq: Every day | ORAL | Status: DC

## 2014-03-18 MED ORDER — PANTOPRAZOLE SODIUM 40 MG PO TBEC
80.0000 mg | DELAYED_RELEASE_TABLET | Freq: Every day | ORAL | Status: DC
  Administered 2014-03-19 – 2014-03-20 (×2): 80 mg via ORAL
  Filled 2014-03-18 (×2): qty 2

## 2014-03-18 MED ORDER — ROPINIROLE HCL 1 MG PO TABS
3.0000 mg | ORAL_TABLET | Freq: Every day | ORAL | Status: DC
  Administered 2014-03-19: 3 mg via ORAL
  Filled 2014-03-18 (×5): qty 3

## 2014-03-18 MED ORDER — ONDANSETRON HCL 4 MG/2ML IJ SOLN: 4.0000 mg | Freq: Once | INTRAMUSCULAR | Status: DC

## 2014-03-18 MED ORDER — LABETALOL HCL 200 MG PO TABS
300.0000 mg | ORAL_TABLET | Freq: Two times a day (BID) | ORAL | Status: DC
  Administered 2014-03-19 – 2014-03-20 (×4): 300 mg via ORAL
  Filled 2014-03-18: qty 1
  Filled 2014-03-18: qty 2
  Filled 2014-03-18: qty 1
  Filled 2014-03-18: qty 2
  Filled 2014-03-18: qty 1
  Filled 2014-03-18: qty 2
  Filled 2014-03-18: qty 1
  Filled 2014-03-18: qty 2

## 2014-03-18 MED ORDER — STROKE: EARLY STAGES OF RECOVERY BOOK
Freq: Once | Status: AC
Start: 2014-03-19 — End: 2014-03-19
  Administered 2014-03-19: 10:00:00
  Filled 2014-03-18: qty 1

## 2014-03-18 MED ORDER — FUROSEMIDE 20 MG PO TABS: 20.0000 mg | ORAL_TABLET | Freq: Every day | ORAL | Status: DC | PRN

## 2014-03-18 MED ORDER — IOHEXOL 350 MG/ML SOLN
80.0000 mL | Freq: Once | INTRAVENOUS | Status: AC | PRN
  Administered 2014-03-18: 80 mL via INTRAVENOUS

## 2014-03-18 MED ORDER — ASPIRIN 300 MG RE SUPP: 300.0000 mg | Freq: Every day | RECTAL | Status: DC

## 2014-03-18 MED ORDER — ASPIRIN 325 MG PO TABS: 325.0000 mg | ORAL_TABLET | Freq: Every day | ORAL | Status: DC

## 2014-03-18 MED ORDER — OXYCODONE-ACETAMINOPHEN 5-325 MG PO TABS
1.0000 | ORAL_TABLET | ORAL | Status: DC | PRN
  Administered 2014-03-19 (×3): 1 via ORAL
  Filled 2014-03-18 (×3): qty 1

## 2014-03-18 MED ORDER — ONDANSETRON HCL 4 MG/2ML IJ SOLN
INTRAMUSCULAR | Status: AC
  Filled 2014-03-18: qty 2

## 2014-03-18 MED ORDER — CLONIDINE HCL 0.3 MG/24HR TD PTWK
0.3000 mg | MEDICATED_PATCH | TRANSDERMAL | Status: DC
  Administered 2014-03-19: 0.3 mg via TRANSDERMAL
  Filled 2014-03-18: qty 3

## 2014-03-18 MED ORDER — ACETAMINOPHEN-CODEINE #3 300-30 MG PO TABS: 1.0000 | ORAL_TABLET | Freq: Four times a day (QID) | ORAL | Status: DC | PRN

## 2014-03-18 MED ORDER — MORPHINE SULFATE 4 MG/ML IJ SOLN
4.0000 mg | Freq: Once | INTRAMUSCULAR | Status: AC
  Administered 2014-03-18: 4 mg via INTRAVENOUS

## 2014-03-18 MED ORDER — ENOXAPARIN SODIUM 30 MG/0.3ML ~~LOC~~ SOLN
30.0000 mg | SUBCUTANEOUS | Status: DC
  Administered 2014-03-19: 30 mg via SUBCUTANEOUS
  Filled 2014-03-18 (×2): qty 0.3

## 2014-03-18 MED ORDER — TRIAMTERENE-HCTZ 37.5-25 MG PO CAPS
1.0000 | ORAL_CAPSULE | Freq: Every morning | ORAL | Status: DC
  Filled 2014-03-18 (×2): qty 1

## 2014-03-18 MED ORDER — HYDRALAZINE HCL 25 MG PO TABS
100.0000 mg | ORAL_TABLET | Freq: Two times a day (BID) | ORAL | Status: DC
  Administered 2014-03-19: 100 mg via ORAL
  Filled 2014-03-18: qty 4
  Filled 2014-03-18: qty 2
  Filled 2014-03-18: qty 4
  Filled 2014-03-18 (×2): qty 2
  Filled 2014-03-18: qty 4
  Filled 2014-03-18: qty 2

## 2014-03-18 MED ORDER — LEVOTHYROXINE SODIUM 50 MCG PO TABS
50.0000 ug | ORAL_TABLET | Freq: Every day | ORAL | Status: DC
  Administered 2014-03-19 (×2): 50 ug via ORAL
  Filled 2014-03-18: qty 1
  Filled 2014-03-18: qty 2
  Filled 2014-03-18 (×2): qty 1

## 2014-03-18 MED ORDER — ISOSORBIDE MONONITRATE ER 60 MG PO TB24
60.0000 mg | ORAL_TABLET | Freq: Every evening | ORAL | Status: DC
  Administered 2014-03-19: 60 mg via ORAL
  Filled 2014-03-18 (×2): qty 1

## 2014-03-18 MED ORDER — MORPHINE SULFATE 4 MG/ML IJ SOLN
INTRAMUSCULAR | Status: AC
  Filled 2014-03-18: qty 1

## 2014-03-18 MED ORDER — SENNOSIDES-DOCUSATE SODIUM 8.6-50 MG PO TABS
1.0000 | ORAL_TABLET | Freq: Every evening | ORAL | Status: DC | PRN
  Filled 2014-03-18: qty 1

## 2014-03-18 MED ORDER — GABAPENTIN 600 MG PO TABS
600.0000 mg | ORAL_TABLET | Freq: Three times a day (TID) | ORAL | Status: DC
  Filled 2014-03-18 (×5): qty 1

## 2014-03-18 MED ORDER — ONDANSETRON HCL 4 MG/2ML IJ SOLN
4.0000 mg | Freq: Once | INTRAMUSCULAR | Status: AC
  Administered 2014-03-18: 4 mg via INTRAVENOUS

## 2014-03-18 NOTE — ED Notes (Signed)
Pt back from CT

## 2014-03-18 NOTE — ED Provider Notes (Signed)
CSN: [id number]     Arrival date & time 03/18/14  2055 History  This chart was scribed for Nat Christen, MD by Tula Nakayama, ED Scribe. This patient was seen in room APA08/APA08 and the patient's care was started at 9:09 PM.    Chief Complaint  Patient presents with  . Chest Pain  . Numbness  . Facial Droop   The history is provided by the patient. No language interpreter was used.   HPI Comments: Level V caveat for urgent need for intervention Katrina [name] is a 53 y.o. female who presents to the Emergency Department complaining of intermittent CP throughout the day, headache and a sudden sharp pain in her head that started 3 hour ago[approc 5 pm]. Pt states worse-than-baseline SOB, diaphoresis, nausea, weakness and numbness in her left leg, weakness in her left arm, and tingling in her left fingers as associated symptoms. Pt visited doctor this morning and had high blood pressure.  Pt has history of TIA, MI in 2000, and has "3 partial heart blockages". Pt has history of DM and states that she smokes cigarettes. She denies taking anti-coagulants and is allergic to Aspirin.   Code stroke called.  Past Medical History  Diagnosis Date  . Diabetes mellitus   . Hypertension   . Hyperlipidemia   . COPD (chronic obstructive pulmonary disease)   . Reflux   . Myocardial infarction   . CAD (coronary artery disease)   . Hypothyroid   . Lung nodules   . Diverticulosis   . Bipolar affective   . Normal cardiac stress test 09/2013   Past Surgical History  Procedure Laterality Date  . Back surgery    . Abdominal hysterectomy    . Appendectomy    . Tonsillectomy    . Ankle surgery    . Cholecystectomy    . Nose surgery     Family History  Problem Relation Age of Onset  . Coronary artery disease Father 44  . Emphysema Father    History  Substance Use Topics  . Smoking status: Current Every Day Smoker -- 1.00 packs/day for 40 years    Types: Cigarettes  . Smokeless tobacco: Not on  file  . Alcohol Use: No   OB History   Grav Para Term Preterm Abortions TAB SAB Ect Mult Living                 Review of Systems  Constitutional: Positive for diaphoresis.  Respiratory: Positive for shortness of breath.   Cardiovascular: Positive for chest pain.  Gastrointestinal: Positive for nausea.  Neurological: Positive for facial asymmetry, weakness and numbness.    10 Systems reviewed and all are negative for acute change except as noted in the HPI.   Allergies  Penicillins; Pineapple; Strawberry; and Aspirin  Home Medications   Prior to Admission medications   Medication Sig Start Date End Date Taking? Authorizing Provider  acetaminophen-codeine (TYLENOL #3) 300-30 MG per tablet Take 1-2 tablets by mouth every 6 (six) hours as needed. 02/10/14   Lenox Ahr, PA-C  carisoprodol (SOMA) 350 MG tablet Take 350 mg by mouth 3 (three) times daily.    Historical Provider, MD  cloNIDine (CATAPRES - DOSED IN MG/24 HR) 0.3 mg/24hr patch Place 1 patch (0.3 mg total) onto the skin once a week. Sundays 01/25/14   Veryl Speak, MD  dexamethasone (DECADRON) 6 MG tablet 1 po bid with food 02/10/14   Lenox Ahr, PA-C  dexlansoprazole (DEXILANT) 60 MG capsule Take  1 capsule (60 mg total) by mouth every evening. 01/25/14   Veryl Speak, MD  EPINEPHrine (EPI-PEN) 0.3 mg/0.3 mL DEVI Inject 0.3 mg into the muscle once.    Historical Provider, MD  furosemide (LASIX) 20 MG tablet Take 1 tablet (20 mg total) by mouth daily as needed for fluid. 01/25/14   Veryl Speak, MD  gabapentin (NEURONTIN) 600 MG tablet Take 1 tablet (600 mg total) by mouth 3 (three) times daily. 01/25/14   Veryl Speak, MD  hydrALAZINE (APRESOLINE) 100 MG tablet Take 1 tablet (100 mg total) by mouth 2 (two) times daily. 01/25/14   Veryl Speak, MD  isosorbide mononitrate (IMDUR) 60 MG 24 hr tablet Take 1 tablet (60 mg total) by mouth every evening. 01/25/14   Veryl Speak, MD  labetalol (NORMODYNE) 300 MG tablet Take 1 tablet  (300 mg total) by mouth 2 (two) times daily. 01/25/14   Veryl Speak, MD  levothyroxine (SYNTHROID, LEVOTHROID) 50 MCG tablet Take 1 tablet (50 mcg total) by mouth at bedtime. 01/25/14   Veryl Speak, MD  methocarbamol (ROBAXIN) 500 MG tablet Take 1 tablet (500 mg total) by mouth 3 (three) times daily. 02/10/14   Lenox Ahr, PA-C  oxyCODONE-acetaminophen (PERCOCET/ROXICET) 5-325 MG per tablet Take 1 tablet by mouth every 4 (four) hours as needed for severe pain (for pain).    Historical Provider, MD  rOPINIRole (REQUIP) 3 MG tablet Take 1 tablet (3 mg total) by mouth at bedtime. 01/25/14   Veryl Speak, MD  triamterene-hydrochlorothiazide (DYAZIDE) 37.5-25 MG per capsule Take 1 each (1 capsule total) by mouth every morning. 01/25/14   Veryl Speak, MD  zolpidem (AMBIEN) 10 MG tablet Take 10 mg by mouth at bedtime.    Historical Provider, MD   BP 187/85  Pulse 72  Temp(Src) 98.2 F (36.8 C) (Oral)  Resp 16  Ht 5\' 7"  (1.702 m)  Wt 214 lb (97.07 kg)  BMI 33.51 kg/m2  SpO2 94% Physical Exam  Nursing note and vitals reviewed. Constitutional: She appears well-developed and well-nourished.  HENT:  Head: Normocephalic and atraumatic.  Eyes: Conjunctivae and EOM are normal. Pupils are equal, round, and reactive to light.  Neck: Normal range of motion. Neck supple.  Cardiovascular: Normal rate, regular rhythm and normal heart sounds.   Pulmonary/Chest: Effort normal and breath sounds normal.  Abdominal: Soft. Bowel sounds are normal.  Musculoskeletal: Normal range of motion.  Neurological: She is alert.  Oriented x 2 Left face droop Left arm and left leg weakness  Skin: Skin is warm and dry.  Psychiatric: She has a normal mood and affect. Her behavior is normal.    ED Course  Procedures (including critical care time)  DIAGNOSTIC STUDIES: Oxygen Saturation is 98% on RA, normal by my interpretation.    COORDINATION OF CARE: 9:22 PM Discussed treatment plan with pt at bedside and pt  agreed to plan.  Labs Review Labs Reviewed  CBC WITH DIFFERENTIAL - Abnormal; Notable for the following:    Neutrophils Relative % 38 (*)    Lymphocytes Relative 52 (*)    Lymphs Abs 4.5 (*)    All other components within normal limits  BASIC METABOLIC PANEL - Abnormal; Notable for the following:    Potassium 3.5 (*)    Glucose, Bld 120 (*)    GFR calc non Af Amer 81 (*)    All other components within normal limits  CBG MONITORING, ED - Abnormal; Notable for the following:    Glucose-Capillary 114 (*)  All other components within normal limits  PROTIME-INR  TROPONIN I    Imaging Review Ct Angio Head W/cm &/or Wo Cm  03/18/2014   CLINICAL DATA:  One day history of headache, sharp head pain beginning 1 hr ago. One day history of numbness and weakness LEFT extremities. Past medical history of hypertension and transient ischemic attack.  EXAM: CT ANGIOGRAPHY HEAD AND NECK  TECHNIQUE: Multidetector CT imaging of the head and neck was performed using the standard protocol during bolus administration of intravenous contrast. Multiplanar CT image reconstructions and MIPs were obtained to evaluate the vascular anatomy. Carotid stenosis measurements (when applicable) are obtained utilizing NASCET criteria, using the distal internal carotid diameter as the denominator.  CONTRAST:  81mL OMNIPAQUE IOHEXOL 350 MG/ML SOLN  COMPARISON:  CT of the head March 18, 2014 at 2129 hr and CT of the head February 27, 2014  FINDINGS: CTA HEAD FINDINGS  Anterior circulation: Normal appearance of the cervical internal carotid arteries, petrous, cavernous and supra clinoid internal carotid arteries. Anterior communicating artery is not distinctly identified. Normal appearance of the anterior and middle cerebral arteries.  Posterior circulation: RIGHT vertebral artery is dominant with normal appearance of the vertebral arteries, vertebrobasilar junction and basilar artery, as well as main branch vessels. Normal  appearance of the posterior cerebral arteries. Tiny RIGHT posterior communicating artery present.  No large vessel occlusion, hemodynamically significant stenosis, dissection, luminal irregularity, contrast extravasation or aneurysm within the anterior nor posterior circulation.  Review of the MIP images confirms the above findings.  CTA NECK FINDINGS  Normal appearance of the thoracic arch, normal branch pattern. Mild calcific atherosclerosis The origins of the innominate, left Common carotid artery and subclavian artery are widely patent.  Bilateral Common carotid arteries are widely patent, coursing in a straight line fashion. Eccentric intimal thickening calcific atherosclerosis of the carotid bulbs measuring up to 3 mm on the LEFT without hemodynamically significant stenosis by NASCET criteria. Normal appearance of the included internal carotid arteries.  RIGHT vertebral artery is dominant. Normal appearance of the vertebral arteries, which appear widely patent.  No hemodynamically significant stenosis by NASCET criteria. No dissection, no pseudoaneurysm. No abnormal luminal irregularity. No contrast extravasation.  Soft tissues are unremarkable. No acute osseous process though bone windows have not been submitted.  Review of the MIP images confirms the above findings.  IMPRESSION: CTA HEAD: No acute vascular process nor hemodynamically significant stenosis.  CTA NECK: No acute vascular process. Atherosclerosis of the carotid bulbs without hemodynamically significant stenosis.   Electronically Signed   By: Elon Alas   On: 03/18/2014 23:31   Ct Head (brain) Wo Contrast  03/18/2014   CLINICAL DATA:  Acute onset headache 1 hr ago.  EXAM: CT HEAD WITHOUT CONTRAST  TECHNIQUE: Contiguous axial images were obtained from the base of the skull through the vertex without intravenous contrast.  COMPARISON:  Head CT scan 02/27/2014.  FINDINGS: There is no evidence of acute intracranial abnormality including  infarct, hemorrhage, mass lesion, mass effect, midline shift or abnormal extra-axial fluid collection. No hydrocephalus or pneumocephalus. The calvarium is intact.  IMPRESSION: Negative head CT.  Critical Value/emergent results were called by telephone at the time of interpretation on 03/18/2014 at 9:33 pm to Dr. Nat Christen , who verbally acknowledged these results.   Electronically Signed   By: Inge Rise M.D.   On: 03/18/2014 21:33   Ct Angio Neck W/cm &/or Wo/cm  03/18/2014   CLINICAL DATA:  One day history of headache, sharp head  pain beginning 1 hr ago. One day history of numbness and weakness LEFT extremities. Past medical history of hypertension and transient ischemic attack.  EXAM: CT ANGIOGRAPHY HEAD AND NECK  TECHNIQUE: Multidetector CT imaging of the head and neck was performed using the standard protocol during bolus administration of intravenous contrast. Multiplanar CT image reconstructions and MIPs were obtained to evaluate the vascular anatomy. Carotid stenosis measurements (when applicable) are obtained utilizing NASCET criteria, using the distal internal carotid diameter as the denominator.  CONTRAST:  83mL OMNIPAQUE IOHEXOL 350 MG/ML SOLN  COMPARISON:  CT of the head March 18, 2014 at 2129 hr and CT of the head February 27, 2014  FINDINGS: CTA HEAD FINDINGS  Anterior circulation: Normal appearance of the cervical internal carotid arteries, petrous, cavernous and supra clinoid internal carotid arteries. Anterior communicating artery is not distinctly identified. Normal appearance of the anterior and middle cerebral arteries.  Posterior circulation: RIGHT vertebral artery is dominant with normal appearance of the vertebral arteries, vertebrobasilar junction and basilar artery, as well as main branch vessels. Normal appearance of the posterior cerebral arteries. Tiny RIGHT posterior communicating artery present.  No large vessel occlusion, hemodynamically significant stenosis,  dissection, luminal irregularity, contrast extravasation or aneurysm within the anterior nor posterior circulation.  Review of the MIP images confirms the above findings.  CTA NECK FINDINGS  Normal appearance of the thoracic arch, normal branch pattern. Mild calcific atherosclerosis The origins of the innominate, left Common carotid artery and subclavian artery are widely patent.  Bilateral Common carotid arteries are widely patent, coursing in a straight line fashion. Eccentric intimal thickening calcific atherosclerosis of the carotid bulbs measuring up to 3 mm on the LEFT without hemodynamically significant stenosis by NASCET criteria. Normal appearance of the included internal carotid arteries.  RIGHT vertebral artery is dominant. Normal appearance of the vertebral arteries, which appear widely patent.  No hemodynamically significant stenosis by NASCET criteria. No dissection, no pseudoaneurysm. No abnormal luminal irregularity. No contrast extravasation.  Soft tissues are unremarkable. No acute osseous process though bone windows have not been submitted.  Review of the MIP images confirms the above findings.  IMPRESSION: CTA HEAD: No acute vascular process nor hemodynamically significant stenosis.  CTA NECK: No acute vascular process. Atherosclerosis of the carotid bulbs without hemodynamically significant stenosis.   Electronically Signed   By: Elon Alas   On: 03/18/2014 23:31     EKG Interpretation None      Date: 03/18/2014  Rate: 81  Rhythm: normal sinus rhythm  QRS Axis: normal  Intervals: normal  ST/T Wave abnormalities: normal  Conduction Disutrbances: none  Narrative Interpretation: unremarkable  CRITICAL CARE Performed by: Nat Christen Total critical care time: 30 Critical care time was exclusive of separately billable procedures and treating other patients. Critical care was necessary to treat or prevent imminent or life-threatening deterioration. Critical care was time  spent personally by me on the following activities: development of treatment plan with patient and/or surrogate as well as nursing, discussions with consultants, evaluation of patient's response to treatment, examination of patient, obtaining history from patient or surrogate, ordering and performing treatments and interventions, ordering and review of laboratory studies, ordering and review of radiographic studies, pulse oximetry and re-evaluation of patient's condition.  MDM   Final diagnoses:  Cerebral infarction due to unspecified mechanism  Chest pain, unspecified chest pain type   History and physical suggestive of left body CVA.   Discussed with telemetry neurologist. No thrombolytics indicated. Also discussed with neurologist at [facility] Dr  Janann Colonel.   Discussed with Dr. Ernestina Patches.  He will evaluate patient  I personally performed the services described in this documentation, which was scribed in my presence. The recorded information has been reviewed and is accurate.   Nat Christen, MD 03/18/14 2352

## 2014-03-18 NOTE — ED Notes (Signed)
teleneuro finished and pt able to answer questions with some assistance; pt's daughter called and notified of pt being admitted, daughter stated pt's boyfriend is on his way to hospital.

## 2014-03-18 NOTE — ED Notes (Signed)
[facility] and TeleNeurology Consult placed for Code Stroke Protocol.

## 2014-03-19 ENCOUNTER — Inpatient Hospital Stay (HOSPITAL_COMMUNITY): Payer: Medicare Other

## 2014-03-19 DIAGNOSIS — I639 Cerebral infarction, unspecified: Secondary | ICD-10-CM

## 2014-03-19 DIAGNOSIS — I059 Rheumatic mitral valve disease, unspecified: Secondary | ICD-10-CM

## 2014-03-19 LAB — LIPID PANEL
Cholesterol: 133 mg/dL (ref 0–200)
HDL: 30 mg/dL — ABNORMAL LOW (ref >39)
LDL CALC: 77 mg/dL (ref 0–99)
Total CHOL/HDL Ratio: 4.4 RATIO
Triglycerides: 131 mg/dL (ref <150)
VLDL: 26 mg/dL (ref 0–40)

## 2014-03-19 LAB — URINALYSIS, ROUTINE W REFLEX MICROSCOPIC
Bilirubin Urine: NEGATIVE
Glucose, UA: NEGATIVE mg/dL
Ketones, ur: NEGATIVE mg/dL
LEUKOCYTES UA: NEGATIVE
Nitrite: NEGATIVE
Protein, ur: NEGATIVE mg/dL
Specific Gravity, Urine: <1.005 — ABNORMAL LOW (ref 1.005–1.030)
UROBILINOGEN UA: 0.2 mg/dL (ref 0.0–1.0)
pH: 7 (ref 5.0–8.0)

## 2014-03-19 LAB — GLUCOSE, CAPILLARY
GLUCOSE-CAPILLARY: 89 mg/dL (ref 70–99)
GLUCOSE-CAPILLARY: 113 mg/dL — AB (ref 70–99)
Glucose-Capillary: 105 mg/dL — ABNORMAL HIGH (ref 70–99)
Glucose-Capillary: 118 mg/dL — ABNORMAL HIGH (ref 70–99)
Glucose-Capillary: 135 mg/dL — ABNORMAL HIGH (ref 70–99)

## 2014-03-19 LAB — URINE MICROSCOPIC-ADD ON

## 2014-03-19 LAB — HEMOGLOBIN A1C
Hgb A1c MFr Bld: 5.9 % — ABNORMAL HIGH (ref <5.7)
MEAN PLASMA GLUCOSE: 123 mg/dL — AB (ref <117)

## 2014-03-19 LAB — TROPONIN I
Troponin I: <0.3 ng/mL (ref <0.30)
Troponin I: <0.3 ng/mL (ref <0.30)
Troponin I: <0.3 ng/mL (ref <0.30)

## 2014-03-19 LAB — MRSA PCR SCREENING: MRSA by PCR: NEGATIVE

## 2014-03-19 MED ORDER — INSULIN ASPART 100 UNIT/ML ~~LOC~~ SOLN: 0.0000 [IU] | SUBCUTANEOUS | Status: DC

## 2014-03-19 MED ORDER — GABAPENTIN 600 MG PO TABS
600.0000 mg | ORAL_TABLET | Freq: Three times a day (TID) | ORAL | Status: DC
  Filled 2014-03-19 (×5): qty 1

## 2014-03-19 MED ORDER — NITROGLYCERIN 0.4 MG SL SUBL: 0.4000 mg | SUBLINGUAL_TABLET | SUBLINGUAL | Status: DC | PRN

## 2014-03-19 MED ORDER — ROSUVASTATIN CALCIUM 10 MG PO TABS
10.0000 mg | ORAL_TABLET | Freq: Every day | ORAL | Status: DC
  Administered 2014-03-19: 10 mg via ORAL
  Filled 2014-03-19: qty 1

## 2014-03-19 MED ORDER — ASPIRIN EC 81 MG PO TBEC
81.0000 mg | DELAYED_RELEASE_TABLET | Freq: Every day | ORAL | Status: DC
  Administered 2014-03-20: 81 mg via ORAL
  Filled 2014-03-19: qty 1

## 2014-03-19 MED ORDER — TRIAMTERENE-HCTZ 37.5-25 MG PO TABS
1.0000 | ORAL_TABLET | Freq: Every day | ORAL | Status: DC
  Administered 2014-03-20: 1 via ORAL
  Filled 2014-03-19 (×3): qty 1

## 2014-03-19 MED ORDER — ZOLPIDEM TARTRATE 5 MG PO TABS
5.0000 mg | ORAL_TABLET | Freq: Every day | ORAL | Status: DC
  Administered 2014-03-19 (×2): 5 mg via ORAL
  Filled 2014-03-19 (×2): qty 1

## 2014-03-19 MED ORDER — GABAPENTIN 300 MG PO CAPS
600.0000 mg | ORAL_CAPSULE | Freq: Three times a day (TID) | ORAL | Status: DC
  Administered 2014-03-19 – 2014-03-20 (×5): 600 mg via ORAL
  Filled 2014-03-19 (×5): qty 2

## 2014-03-19 MED ORDER — CLONIDINE HCL 0.3 MG/24HR TD PTWK
MEDICATED_PATCH | TRANSDERMAL | Status: AC
  Filled 2014-03-19: qty 1

## 2014-03-19 MED ORDER — ENOXAPARIN SODIUM 40 MG/0.4ML ~~LOC~~ SOLN
40.0000 mg | SUBCUTANEOUS | Status: DC
  Filled 2014-03-19: qty 0.4

## 2014-03-19 MED ORDER — NICOTINE 21 MG/24HR TD PT24
21.0000 mg | MEDICATED_PATCH | Freq: Every day | TRANSDERMAL | Status: DC
  Administered 2014-03-20: 21 mg via TRANSDERMAL
  Filled 2014-03-19 (×2): qty 1

## 2014-03-19 MED ORDER — MORPHINE SULFATE 2 MG/ML IJ SOLN
1.0000 mg | INTRAMUSCULAR | Status: DC | PRN
  Administered 2014-03-19: 2 mg via INTRAVENOUS
  Filled 2014-03-19: qty 1

## 2014-03-19 NOTE — Consult Note (Signed)
Katrina A. Merlene Laughter, MD     [email or web address]          Katrina [name] is an 53 y.o. female.   ASSESSMENT/PLAN:  Focal left-sided weakness of unclear etiology. Imaging does not show acute infarct. Given the patient's age and the MRI findings, demyelinating processes are concerning particularly multiple sclerosis. Consequent, MRI of the cervical spine will be obtained. A repeat MRI of the brain with and also be obtained. Additional labs will also be obtained.   The patient is a 53 year old white female who presents with the acute onset of left facial tingling, tingling of the upper and lower extremities on the left side. The symptoms are associated with left hemiparesis/heaviness. She also reports some drooping of the left facial region. The patient tells me that she has had TIA in the past with numbness and tingling of the left side but never with the weakness on the left side. The symptoms occurred rather abruptly a few days ago. The symptoms persist although they have improved. She does have a long-standing history of diabetes. Symptoms were associated only with headache but also in chest pain or shortness of breath. No loss of consciousness is reported. No GI or GU symptoms. The review of systems is otherwise negative.  GENERAL: Obese female in no acute distress.  HEENT: Supple. Atraumatic normocephalic.   ABDOMEN: soft  EXTREMITIES: No edema   BACK: Normal.  SKIN: Normal by inspection.    MENTAL STATUS: Alert and oriented. Speech, language and cognition are generally intact. Judgment and insight normal.   CRANIAL NERVES: Pupils are equal, round and reactive to light and accommodation; extra ocular movements are full, there is no significant nystagmus; visual fields are full; upper and lower facial muscles are normal in strength and symmetric, there is flattening of the nasolabial fold- L; tongue is midline; uvula is midline; shoulder elevation is  normal.  MOTOR: Right upper extremity shows normal strength; left upper extremity deltoid, tricep and handgrip 4/5. There is a significant pronator drift left upper extremity. Hip flexion bilaterally 4/5 and dorsiflexion 4+.  COORDINATION: Left finger to nose is normal, right finger to nose is normal, No rest tremor; no intention tremor; no postural tremor; no bradykinesia.  REFLEXES: Deep tendon reflexes are symmetrical and normal- but clearly brisk in the legs 3+. Babinski reflexes are flexor bilaterally.   SENSATION: Reduced to light touch and temperature left upper extremity, left thoracic/left trunk region and left lower extremity. Face is actually unremarkable bilaterally.      Blood pressure 188/91, pulse 62, temperature 97.8 F (36.6 C), temperature source Oral, resp. rate 18, height _0  (1.702 m), weight 98.3 kg (216 lb 11.4 oz), SpO2 100.00%.  Past Medical History  Diagnosis Date  . Diabetes mellitus   . Hypertension   . Hyperlipidemia   . COPD (chronic obstructive pulmonary disease)   . Reflux   . Myocardial infarction   . CAD (coronary artery disease)   . Hypothyroid   . Lung nodules   . Diverticulosis   . Bipolar affective   . Normal cardiac stress test 09/2013    Past Surgical History  Procedure Laterality Date  . Back surgery    . Abdominal hysterectomy    . Appendectomy    . Tonsillectomy    . Ankle surgery    . Cholecystectomy    . Nose surgery      Family History  Problem Relation Age of Onset  . Coronary artery disease Father 48  .  Emphysema Father     Social History:  reports that she has been smoking Cigarettes.  She has a 40 pack-year smoking history. She does not have any smokeless tobacco history on file. She reports that she does not drink alcohol or use illicit drugs.  Allergies:  Allergies  Allergen Reactions  . Penicillins Anaphylaxis  . Pineapple Rash  . Strawberry Rash  . Aspirin     Medications: Prior to Admission  medications   Medication Sig Start Date End Date Taking? Authorizing Provider  acetaminophen-codeine (TYLENOL #3) 300-30 MG per tablet Take 1-2 tablets by mouth every 6 (six) hours as needed (pain).   Yes Historical Provider, MD  carisoprodol (SOMA) 350 MG tablet Take 350 mg by mouth 3 (three) times daily.   Yes Historical Provider, MD  cloNIDine (CATAPRES - DOSED IN MG/24 HR) 0.3 mg/24hr patch Place 1 patch (0.3 mg total) onto the skin once a week. Sundays 01/25/14  Yes Veryl Speak, MD  dexlansoprazole (DEXILANT) 60 MG capsule Take 1 capsule (60 mg total) by mouth every evening. 01/25/14  Yes Veryl Speak, MD  EPINEPHrine (EPI-PEN) 0.3 mg/0.3 mL DEVI Inject 0.3 mg into the muscle once.   Yes Historical Provider, MD  furosemide (LASIX) 20 MG tablet Take 1 tablet (20 mg total) by mouth daily as needed for fluid. 01/25/14  Yes Veryl Speak, MD  gabapentin (NEURONTIN) 600 MG tablet Take 1 tablet (600 mg total) by mouth 3 (three) times daily. 01/25/14  Yes Veryl Speak, MD  labetalol (NORMODYNE) 300 MG tablet Take 1 tablet (300 mg total) by mouth 2 (two) times daily. 01/25/14  Yes Veryl Speak, MD  levothyroxine (SYNTHROID, LEVOTHROID) 50 MCG tablet Take 1 tablet (50 mcg total) by mouth at bedtime. 01/25/14  Yes Veryl Speak, MD  oxyCODONE-acetaminophen (PERCOCET/ROXICET) 5-325 MG per tablet Take 1 tablet by mouth every 4 (four) hours as needed for severe pain (for pain).   Yes Historical Provider, MD  rOPINIRole (REQUIP) 3 MG tablet Take 1 tablet (3 mg total) by mouth at bedtime. 01/25/14  Yes Veryl Speak, MD  triamterene-hydrochlorothiazide (DYAZIDE) 37.5-25 MG per capsule Take 1 each (1 capsule total) by mouth every morning. 01/25/14  Yes Veryl Speak, MD  zolpidem (AMBIEN) 10 MG tablet Take 10 mg by mouth at bedtime.   Yes Historical Provider, MD    Scheduled Meds: . aspirin EC  81 mg Oral Daily  . cloNIDine  0.3 mg Transdermal Weekly  . enoxaparin (LOVENOX) injection  40 mg Subcutaneous Q24H  .  gabapentin  600 mg Oral TID  . hydrALAZINE  100 mg Oral BID  . insulin aspart  0-9 Units Subcutaneous 6 times per day  . isosorbide mononitrate  60 mg Oral QPM  . labetalol  300 mg Oral BID  . levothyroxine  50 mcg Oral QHS  . nicotine  21 mg Transdermal Daily  . pantoprazole  80 mg Oral Daily  . rOPINIRole  3 mg Oral QHS  . rosuvastatin  10 mg Oral q1800  . triamterene-hydrochlorothiazide  1 tablet Oral Daily  . zolpidem  5 mg Oral QHS   Continuous Infusions:  PRN Meds:.acetaminophen-codeine, furosemide, morphine injection, nitroGLYCERIN, oxyCODONE-acetaminophen, senna-docusate     Results for orders placed during the hospital encounter of 03/18/14 (from the past 48 hour(s))  CBG MONITORING, ED     Status: Abnormal   Collection Time    03/18/14  9:10 PM      Result Value Ref Range   Glucose-Capillary 114 (*) 70 - 99  mg/dL  CBC WITH DIFFERENTIAL     Status: Abnormal   Collection Time    03/18/14  9:55 PM      Result Value Ref Range   WBC 8.5  4.0 - 10.5 K/uL   RBC 4.47  3.87 - 5.11 MIL/uL   Hemoglobin 13.4  12.0 - 15.0 g/dL   HCT 39.5  36.0 - 46.0 %   MCV 88.4  78.0 - 100.0 fL   MCH 30.0  26.0 - 34.0 pg   MCHC 33.9  30.0 - 36.0 g/dL   RDW 14.6  11.5 - 15.5 %   Platelets 265  150 - 400 K/uL   Neutrophils Relative % 38 (*) 43 - 77 %   Neutro Abs 3.2  1.7 - 7.7 K/uL   Lymphocytes Relative 52 (*) 12 - 46 %   Lymphs Abs 4.5 (*) 0.7 - 4.0 K/uL   Monocytes Relative 6  3 - 12 %   Monocytes Absolute 0.5  0.1 - 1.0 K/uL   Eosinophils Relative 3  0 - 5 %   Eosinophils Absolute 0.3  0.0 - 0.7 K/uL   Basophils Relative 1  0 - 1 %   Basophils Absolute 0.0  0.0 - 0.1 K/uL  BASIC METABOLIC PANEL     Status: Abnormal   Collection Time    03/18/14  9:55 PM      Result Value Ref Range   Sodium 140  137 - 147 mEq/L   Potassium 3.5 (*) 3.7 - 5.3 mEq/L   Chloride 106  96 - 112 mEq/L   CO2 22  19 - 32 mEq/L   Glucose, Bld 120 (*) 70 - 99 mg/dL   BUN 16  6 - 23 mg/dL   Creatinine,  Ser 0.81  0.50 - 1.10 mg/dL   Calcium 8.8  8.4 - 10.5 mg/dL   GFR calc non Af Amer 81 (*) >90 mL/min   GFR calc Af Amer >90  >90 mL/min   Comment: (NOTE)     The eGFR has been calculated using the CKD EPI equation.     This calculation has not been validated in all clinical situations.     eGFR's persistently <90 mL/min signify possible Chronic Kidney     Disease.   Anion gap 12  5 - 15  PROTIME-INR     Status: None   Collection Time    03/18/14  9:55 PM      Result Value Ref Range   Prothrombin Time 14.2  11.6 - 15.2 seconds   INR 1.10  0.00 - 1.49  TROPONIN I     Status: None   Collection Time    03/18/14  9:55 PM      Result Value Ref Range   Troponin I <0.30  <0.30 ng/mL   Comment:            Due to the release kinetics of cTnI,     a negative result within the first hours     of the onset of symptoms does not rule out     myocardial infarction with certainty.     If myocardial infarction is still suspected,     repeat the test at appropriate intervals.  URINALYSIS, ROUTINE W REFLEX MICROSCOPIC     Status: Abnormal   Collection Time    03/18/14 11:53 PM      Result Value Ref Range   Color, Urine YELLOW  YELLOW   APPearance CLEAR  CLEAR   Specific  Gravity, Urine <1.005 (*) 1.005 - 1.030   pH 7.0  5.0 - 8.0   Glucose, UA NEGATIVE  NEGATIVE mg/dL   Hgb urine dipstick LARGE (*) NEGATIVE   Bilirubin Urine NEGATIVE  NEGATIVE   Ketones, ur NEGATIVE  NEGATIVE mg/dL   Protein, ur NEGATIVE  NEGATIVE mg/dL   Urobilinogen, UA 0.2  0.0 - 1.0 mg/dL   Nitrite NEGATIVE  NEGATIVE   Leukocytes, UA NEGATIVE  NEGATIVE  URINE MICROSCOPIC-ADD ON     Status: Abnormal   Collection Time    03/18/14 11:53 PM      Result Value Ref Range   Squamous Epithelial / LPF MANY (*) RARE   WBC, UA 0-2  <3 WBC/hpf   RBC / HPF 11-20  <3 RBC/hpf   Bacteria, UA FEW (*) RARE  MRSA PCR SCREENING     Status: None   Collection Time    03/19/14  1:30 AM      Result Value Ref Range   MRSA by PCR  NEGATIVE  NEGATIVE   Comment:            The GeneXpert MRSA Assay (FDA     approved for NASAL specimens     only), is one component of a     comprehensive MRSA colonization     surveillance program. It is not     intended to diagnose MRSA     infection nor to guide or     monitor treatment for     MRSA infections.  HEMOGLOBIN A1C     Status: Abnormal   Collection Time    03/19/14  4:49 AM      Result Value Ref Range   Hemoglobin A1C 5.9 (*) <5.7 %   Comment: (NOTE)                                                                               According to the ADA Clinical Practice Recommendations for 2011, when     HbA1c is used as a screening test:      >=6.5%   Diagnostic of Diabetes Mellitus               (if abnormal result is confirmed)     5.7-6.4%   Increased risk of developing Diabetes Mellitus     References:Diagnosis and Classification of Diabetes Mellitus,Diabetes     EPPI,9518,84(ZYSAY 1):S62-S69 and Standards of Medical Care in             Diabetes - 2011,Diabetes Care,2011,34 (Suppl 1):S11-S61.   Mean Plasma Glucose 123 (*) <117 mg/dL   Comment: Performed at [facility]     Status: Abnormal   Collection Time    03/19/14  4:49 AM      Result Value Ref Range   Cholesterol 133  0 - 200 mg/dL   Triglycerides 131  <150 mg/dL   HDL 30 (*) >39 mg/dL   Total CHOL/HDL Ratio 4.4     VLDL 26  0 - 40 mg/dL   LDL Cholesterol 77  0 - 99 mg/dL   Comment:            Total Cholesterol/HDL:CHD Risk  Coronary Heart Disease Risk Table                         Men   Women      1/2 Average Risk   3.4   3.3      Average Risk       5.0   4.4      2 X Average Risk   9.6   7.1      3 X Average Risk  23.4   11.0                Use the calculated Patient Ratio     above and the CHD Risk Table     to determine the patient's CHD Risk.                ATP III CLASSIFICATION (LDL):      <100     mg/dL   Optimal      100-129  mg/dL   Near or Above                         Optimal      130-159  mg/dL   Borderline      160-189  mg/dL   High      >190     mg/dL   Very High  TROPONIN I     Status: None   Collection Time    03/19/14  4:49 AM      Result Value Ref Range   Troponin I <0.30  <0.30 ng/mL   Comment:            Due to the release kinetics of cTnI,     a negative result within the first hours     of the onset of symptoms does not rule out     myocardial infarction with certainty.     If myocardial infarction is still suspected,     repeat the test at appropriate intervals.  GLUCOSE, CAPILLARY     Status: Abnormal   Collection Time    03/19/14  4:52 AM      Result Value Ref Range   Glucose-Capillary 105 (*) 70 - 99 mg/dL  GLUCOSE, CAPILLARY     Status: None   Collection Time    03/19/14  7:53 AM      Result Value Ref Range   Glucose-Capillary 89  70 - 99 mg/dL  TROPONIN I     Status: None   Collection Time    03/19/14  9:58 AM      Result Value Ref Range   Troponin I <0.30  <0.30 ng/mL   Comment:            Due to the release kinetics of cTnI,     a negative result within the first hours     of the onset of symptoms does not rule out     myocardial infarction with certainty.     If myocardial infarction is still suspected,     repeat the test at appropriate intervals.  GLUCOSE, CAPILLARY     Status: Abnormal   Collection Time    03/19/14  2:28 PM      Result Value Ref Range   Glucose-Capillary 113 (*) 70 - 99 mg/dL   Comment 1 Notify RN    TROPONIN I     Status: None   Collection Time    03/19/14  3:45 PM      Result Value Ref Range   Troponin I <0.30  <0.30 ng/mL   Comment:            Due to the release kinetics of cTnI,     a negative result within the first hours     of the onset of symptoms does not rule out     myocardial infarction with certainty.     If myocardial infarction is still suspected,     repeat the test at appropriate intervals.  GLUCOSE, CAPILLARY     Status: Abnormal   Collection Time     03/19/14  6:22 PM      Result Value Ref Range   Glucose-Capillary 118 (*) 70 - 99 mg/dL   Comment 1 Notify RN      Studies/Results:  ECHO - Left ventricle: The cavity size was normal. Systolic function was normal. The estimated ejection fraction was in the range of 60% to 65%. Wall motion was normal; there were no regional wall motion abnormalities. Features are consistent with a pseudonormal left ventricular filling pattern, with concomitant abnormal relaxation and increased filling pressure (grade 2 diastolic dysfunction). Doppler parameters are consistent with high ventricular filling pressure. Moderate concentric and severe focal basal septal hypertrophy. - Mitral valve: There was mild regurgitation. - Tricuspid valve: There was mild regurgitation. - Pulmonary arteries: PA peak pressure: 36 mm Hg (S). Mildly elevated pulmonary pressures.    BRAIN AND NECK CTA NORMAL  BRAIN MRI/MRA NORMAL     Earla Charlie A. Merlene [name], M.D.  Diplomate, Tax adviser of Psychiatry and Neurology ( Neurology). 03/19/2014, 6:45 PM

## 2014-03-19 NOTE — Evaluation (Signed)
Clinical/Bedside Swallow Evaluation Patient Details  Name: Katrina [name] MRN: [id number] Date of Birth: 10/05/1960  Today's Date: 03/19/2014 Time: 4081-4481 SLP Time Calculation (min): 15 min  Past Medical History:  Past Medical History  Diagnosis Date  . Diabetes mellitus   . Hypertension   . Hyperlipidemia   . COPD (chronic obstructive pulmonary disease)   . Reflux   . Myocardial infarction   . CAD (coronary artery disease)   . Hypothyroid   . Lung nodules   . Diverticulosis   . Bipolar affective   . Normal cardiac stress test 09/2013   Past Surgical History:  Past Surgical History  Procedure Laterality Date  . Back surgery    . Abdominal hysterectomy    . Appendectomy    . Tonsillectomy    . Ankle surgery    . Cholecystectomy    . Nose surgery     HPI:  This is a 53 y.o. year old female with significant past medical history of morbid obesity, TIA, non obstructive CAD, gastric ulcer, type 2 DM, HTN presenting with CVA, chest pain. Pt reports intermittent L sided weakness over the past 12-24 hours. Sxs have waxed and waned over the course of the day. States that she has also had mild central chest pain over the course of the day as well. Intermittent in nature. No radiation. No nausea or diaphoresis. Still smoking 1 1/2 PPD. Presented to ER with L sided weakness at AP. Code stroke called. EDP called teleneuro who felt pt was outside of intervention window. CTA head and neck obtained that was negative for acute vascular process. Head CT also WNL. Recommendation for admission for CVA workup. Pt has not taken NTG because of assd headache. Also states that she does not take ASA because of hx/o gastric ulcer 3-4 years ago. Trop and EKG WNL on presentation.    Dg Chest 2 View  03/19/2014 CLINICAL DATA: Intermittent chest pain for 1 day. History of smoking, CAD and COPD. Initial encounter. EXAM: CHEST 2 VIEW COMPARISON: 09/22/2013; 04/02/2012 FINDINGS: Grossly unchanged cardiac  silhouette and mediastinal contours. Evaluation retrosternal clear space obscured secondary to overlying soft tissues. The lungs remain hyperexpanded. Grossly unchanged bibasilar heterogeneous opacities, left greater than right, likely atelectasis. No new focal airspace opacities. No pleural effusion or pneumothorax. No evidence of edema. No acute osseus abnormalities. IMPRESSION: Hyperexpanded lungs and bibasilar atelectasis without definite acute cardiopulmonary disease. Electronically Signed By: Sandi Mariscal M.D. On: 03/19/2014 00:30   IMPRESSION: MRI HEAD: No acute infarct. Moderate punctate and patchy nonspecific white matter type changes. Given the patient's history of smoking, diabetes and high blood pressure, findings are most likely related to result of small vessel disease rather than demyelinating process, vasculitis or inflammatory process. No intracranial hemorrhage. The questionable increased signal within sulci in the occipital region on series 6 is not confirmed as a true abnormality on other sequences and therefore may be related to motion artifact rather than subarachnoid hemorrhage or meningitis. MRA HEAD: Motion artifact as noted above. Mild branch vessel irregularity. Electronically Signed By: Chauncey Cruel M.D. On: 03/19/2014 13:32   Assessment / Plan / Recommendation Clinical Impression  Pt initially failed RN swallow screen in ED so SLP was asked to complete BSE. Pt was unavailable earlier today and when SLP arrived this evening, she had just consumed a cheeseburger and coke from [employer]. Her dinner tray of a salad, hamburger, tea, and chocolate pudding remained untouched and pt stated, "I wouldn't eat that if they paid me." Pt  denied difficulties swallowing and reported no changes in her speech, thinking, word finding, or memory. She did endorse soreness on the left side of her face/neck. With encouragement, pt drank more soda and ate some crackers for SLP. No overt signs or symptoms  aspiration noted. No real weakness/droop noted during examination. Recommend regular diet with thin liquids. No further SLP f/u indicated at this time.    Aspiration Risk   None   Diet Recommendation Regular;Thin liquid   Liquid Administration via: Cup;Straw Medication Administration: Whole meds with liquid Supervision: Patient able to self feed Postural Changes and/or Swallow Maneuvers: Seated upright 90 degrees;Upright 30-60 min after meal    Other  Recommendations Oral Care Recommendations: Oral care BID Other Recommendations: Clarify dietary restrictions   Follow Up Recommendations  None    Frequency and Duration  N/A     Pertinent Vitals/Pain VSS    SLP Swallow Goals  N/A   Swallow Study Prior Functional Status   WFL    General Date of Onset: 03/18/14 HPI: This is a 53 y.o. year old female with significant past medical history of morbid obesity, TIA, non obstructive CAD, gastric ulcer, type 2 DM, HTN presenting with CVA, chest pain. Pt reports intermittent L sided weakness over the past 12-24 hours. Sxs have waxed and waned over the course of the day. States that she has also had mild central chest pain over the course of the day as well. Intermittent in nature. No radiation. No nausea or diaphoresis. Still smoking 1 1/2 PPD. Presented to ER with L sided weakness at AP. Code stroke called. EDP called teleneuro who felt pt was outside of intervention window. CTA head and neck obtained that was negative for acute vascular process. Head CT also WNL. Recommendation for admission for CVA workup. Pt has not taken NTG because of assd headache. Also states that she does not take ASA because of hx/o gastric ulcer 3-4 years ago. Trop and EKG WNL on presentation.  Type of Study: Bedside swallow evaluation Diet Prior to this Study: Dysphagia 3 (soft);Thin liquids Temperature Spikes Noted: No Respiratory Status: Room air History of Recent Intubation: No Behavior/Cognition:  Alert;Cooperative Oral Cavity - Dentition: Dentures, top;Dentures, bottom Self-Feeding Abilities: Able to feed self Patient Positioning: Upright in bed Baseline Vocal Quality: Clear;Hoarse Volitional Cough: Strong Volitional Swallow: Able to elicit    Oral/Motor/Sensory Function Overall Oral Motor/Sensory Function: Appears within functional limits for tasks assessed Labial ROM: Within Functional Limits Labial Symmetry: Within Functional Limits Labial Strength: Within Functional Limits Labial Sensation: Within Functional Limits Lingual ROM: Within Functional Limits Lingual Symmetry: Within Functional Limits Lingual Strength: Within Functional Limits Lingual Sensation: Within Functional Limits Facial ROM: Within Functional Limits Facial Symmetry: Within Functional Limits Facial Strength: Within Functional Limits Facial Sensation: Within Functional Limits Velum: Within Functional Limits Mandible: Within Functional Limits   Ice Chips Ice chips: Within functional limits Presentation: Self Fed   Thin Liquid Thin Liquid: Within functional limits Presentation: Self Fed;Cup    Nectar Thick Nectar Thick Liquid: Not tested   Honey Thick Honey Thick Liquid: Not tested   Puree Puree: Not tested   Solid   Thank you,  Genene Churn, CCC-SLP [phone]     Solid: Within functional limits Presentation: Self Fed       PORTER,DABNEY 03/19/2014,9:22 PM

## 2014-03-19 NOTE — Progress Notes (Signed)
  Echocardiogram 2D Echocardiogram has been performed.  [place], [place] 03/19/2014, 3:47 PM

## 2014-03-19 NOTE — Progress Notes (Signed)
UR chart review completed.  

## 2014-03-19 NOTE — Progress Notes (Signed)
SLP Cancellation Note  Patient Details Name: Katrina [name] MRN: [id number] DOB: 06-Dec-1960   Cancelled treatment:       Reason Eval/Treat Not Completed: Patient at procedure or test/unavailable; Pt down for MRI. Will return later this evening.   Anel Purohit 03/19/2014, 12:51 PM

## 2014-03-19 NOTE — Evaluation (Signed)
Physical Therapy Evaluation Patient Details Name: Katrina [name] MRN: [id number] DOB: June 06, 1961 Today's Date: 03/19/2014   History of Present Illness  This is a 53 y.o. year old female with significant past medical history of morbid obesity, TIA, non obstructive CAD, gastric ulcer, type 2 DM, HTN presenting with CVA, chest pain. Pt reports intermittent L sided weakness over the past 12-24 hours. Sxs have waxed and waned over the course of the day. States that she has also had mild central chest pain over the course of the day as well. Intermittent in nature. No radiation. No nausea or diaphoresis. Still smoking 1 1/2 PPD.   Presented to ER with L sided weakness at [facility]. Code stroke called. EDP called teleneuro who felt pt was outside of intervention window. CTA head and neck obtained that was negative for acute vascular process. Head CT also WNL. Recommendation for admission for CVA workup. Pt has not taken NTG because of assd headache. Also states that she does not take ASA because of hx/o gastric ulcer 3-4 years ago. Trop and EKG WNL on presentation.     Clinical Impression   Pt was seen for evaluation.  She had a left mouth droop and appeared to be using her LUE spontaneously. At times, her mouth droop was not present.   She described numbness in the left foot and on muscle testing she had significant weakness in the left leg.  However, there were many inconsistencies during the evaluation.  She was able to move the left leg without too much difficulty when transferring to the edge of the bed.  She needed a walker to stabilize her gait because of dragging of the LLE.  However, there were periods of time that she was able to move the LLE normally.  Pt was very pleasant and cooperative, able to follow all directions.  She stated that she has had an injury to her low back but this has caused mild right LE weakness.  Pt may be able to progress to gait with a cane (which she occasionally uses) and she is  agreeable to outpatient PT if needed.    Follow Up Recommendations Outpatient PT    Equipment Recommendations  Rolling walker with 5" wheels    Recommendations for Other Services   OT    Precautions / Restrictions Precautions Precautions: Fall Restrictions Weight Bearing Restrictions: No      Mobility  Bed Mobility Overal bed mobility: Modified Independent                Transfers Overall transfer level: Modified independent Equipment used: Rolling walker (2 wheeled) Transfers: Sit to/from Stand Sit to Stand: Modified independent (Device/Increase time)            Ambulation/Gait Ambulation/Gait assistance: Supervision Ambulation Distance (Feet): 200 Feet Assistive device: Rolling walker (2 wheeled) Gait Pattern/deviations: Decreased step length - left;Decreased dorsiflexion - left   Gait velocity interpretation: Below normal speed for age/gender General Gait Details: gait pattern varies...at times, she moves the left LE normally and then other times she severely drags the LLE  Stairs Stairs: Yes Stairs assistance: Supervision Stair Management: One rail Right;Forwards Number of Stairs: 10 General stair comments: pt was able to flex left hip and DF the ankle enough to lift the leg onto the next higher step  Wheelchair Mobility    Modified Rankin (Stroke Patients Only)       Balance Overall balance assessment: Needs assistance Sitting-balance support: No upper extremity supported;Feet supported Sitting balance-Leahy Scale: Normal  Standing balance support: No upper extremity supported Standing balance-Leahy Scale: Fair                               Pertinent Vitals/Pain Pain Assessment: No/denies pain    Home Living Family/patient expects to be discharged to:: Private residence Living Arrangements: Children Available Help at Discharge: Family;Available PRN/intermittently Type of Home: House Home Access: Stairs to  enter Entrance Stairs-Rails: Right Entrance Stairs-Number of Steps: 2 Home Layout: Multi-level Home Equipment: Cane - single point      Prior Function Level of Independence: Independent         Comments: used cane intermittently for ambulation.  pt states she was independent with all ADLs and IADLs.     Hand Dominance   Dominant Hand: Right    Extremity/Trunk Assessment   Upper Extremity Assessment: Defer to OT evaluation           Lower Extremity Assessment: LLE deficits/detail   LLE Deficits / Details: on manual muscle test, strength in hip flexors=3-/5, knee extension=2+/5, ankle DF=2-/5...this muscle power is very inconsistent during function  Cervical / Trunk Assessment: Normal  Communication   Communication: No difficulties  Cognition Arousal/Alertness: Awake/alert Behavior During Therapy: WFL for tasks assessed/performed Overall Cognitive Status: Within Functional Limits for tasks assessed                      General Comments      Exercises        Assessment/Plan    PT Assessment Patient needs continued PT services  PT Diagnosis Difficulty walking;Abnormality of gait;Hemiplegia non-dominant side   PT Problem List Decreased strength;Decreased balance;Decreased coordination;Obesity  PT Treatment Interventions Gait training;Therapeutic exercise;Functional mobility training   PT Goals (Current goals can be found in the Care Plan section) Acute Rehab PT Goals Patient Stated Goal: none stated PT Goal Formulation: With patient Time For Goal Achievement: 04/02/14 Potential to Achieve Goals: Good    Frequency Min 3X/week   Barriers to discharge        Co-evaluation               End of Session Equipment Utilized During Treatment: Gait belt Activity Tolerance: Patient tolerated treatment well Patient left: in bed;with call bell/phone within reach;with family/visitor present Nurse Communication: Mobility status         Time:  1224-8250 PT Time Calculation (min): 41 min   Charges:   PT Evaluation $Initial PT Evaluation Tier I: 1 Procedure PT Treatments $Gait Training: 8-22 mins   PT G CodesSable Feil 03/19/2014, 3:15 PM

## 2014-03-19 NOTE — H&P (Addendum)
Hospitalist Admission History and Physical  Patient name: Katrina [name] Medical record number: [id number] Date of birth: 27-Aug-1960 Age: 53 y.o. Gender: female  Primary Care Provider: No PCP Per Patient  Chief Complaint: CVA, chest pain  History of Present Illness:This is a 53 y.o. year old female with significant past medical history of morbid obesity, TIA, non obstructive CAD, gastric ulcer, type 2 DM, HTN  presenting with CVA, chest pain. Pt reports intermittent L sided weakness over the past 12-24 hours. Sxs have waxed and waned over the course of the day. States that she has also had mild central chest pain over the course of the day as well. Intermittent in nature. No radiation. No nausea or diaphoresis. Still smoking 1 1/2 PPD.  Presented to ER with L sided weakness at [facility]. Code stroke called. EDP called teleneuro who felt pt was outside of intervention window. CTA head and neck obtained that was negative for acute vascular process. Head CT also WNL. Recommendation for admission for CVA workup. Pt has not taken NTG because of assd headache. Also states that she does not take ASA because of hx/o gastric ulcer 3-4 years ago. Trop and EKG WNL on presentation.   Assessment and Plan: Katrina [name] is a 53 y.o.  year old female presenting with CVA, Chest Pain    Active Problems:   CVA (cerebral infarction)   1- CVA -Proceed down stroke pathway including MRI, MRA, 2D ECHO, risk stratification labs (carotid dopplers not ordered-unable to order at [facility]-f/u with am team) -Received full dose ASA in ER  -consider low dose ASA long term- heavy tobacco abuse may be greater exacerbator of GI pathology long term -tobacco abuse counseling -nicotine patch  -neuro consult in am   2-Chest Pain/CAD -trop neg x1 -EKG w/o any significant ST/T wave abnormalities  -s/p ASA  -cycle CEs  -noted myoview 09/2013 negative for inducible ischemia or significant wall motion abnormalities.  -cycle  CEs -consider cards c/s in am   3-DM  -SSI, A1C   4- HTN  -mildly elevated on presentation -poorly controlled per pt  -cont home regimen -avoid >25 % decrease from baseline admission as to induce hypoperfusion   5-Tobacco abuse -tobacco abuse counseling -nicotine patch   6-COPD  -stable from a resp standpoint -cont home regimen  FEN/GI/hx/oo gastric ulcer : cont high dose PPI. S/p full dose ASA in ER in setting of hx/o gastric ulcer. Consider low dose asa given above. heavy tobacco abuse may be greater exacerbator of GI pathology long term vs. ASA. May need to discuss with GI.   Prophylaxis: lovenox  Disposition: pending further evaluation  Code Status:Full Code    Patient Active Problem List   Diagnosis Date Noted  . CVA (cerebral infarction) 03/18/2014  . DM2 (diabetes mellitus, type 2) 09/23/2013  . Dyspnea 03/05/2012  . Chest pain 03/05/2012  . Tobacco abuse 03/05/2012  . COPD (chronic obstructive pulmonary disease) 11/23/2011  . GERD (gastroesophageal reflux disease) 11/23/2011  . HTN (hypertension) 11/23/2011   Past Medical History: Past Medical History  Diagnosis Date  . Diabetes mellitus   . Hypertension   . Hyperlipidemia   . COPD (chronic obstructive pulmonary disease)   . Reflux   . Myocardial infarction   . CAD (coronary artery disease)   . Hypothyroid   . Lung nodules   . Diverticulosis   . Bipolar affective   . Normal cardiac stress test 09/2013    Past Surgical History: Past Surgical History  Procedure Laterality Date  .  Back surgery    . Abdominal hysterectomy    . Appendectomy    . Tonsillectomy    . Ankle surgery    . Cholecystectomy    . Nose surgery      Social History: History   Social History  . Marital Status: Divorced    Spouse Name: N/A    Number of Children: N/A  . Years of Education: N/A   Occupational History  .      Disability for back pain   Social History Main Topics  . Smoking status: Current Every Day  Smoker -- 1.00 packs/day for 40 years    Types: Cigarettes  . Smokeless tobacco: None  . Alcohol Use: No  . Drug Use: No  . Sexual Activity: None   Other Topics Concern  . None   Social History Narrative  . None    Family History: Family History  Problem Relation Age of Onset  . Coronary artery disease Father 53  . Emphysema Father     Allergies: Allergies  Allergen Reactions  . Penicillins Anaphylaxis  . Pineapple Rash  . Strawberry Rash  . Aspirin     Current Facility-Administered Medications  Medication Dose Route Frequency Provider Last Rate Last Dose  .  stroke: mapping our early stages of recovery book   Does not apply Once Shanda Howells, MD      . acetaminophen-codeine (TYLENOL #3) 300-30 MG per tablet 1-2 tablet  1-2 tablet Oral Q6H PRN Shanda Howells, MD      . aspirin suppository 300 mg  300 mg Rectal Daily Shanda Howells, MD       Or  . aspirin tablet 325 mg  325 mg Oral Daily Shanda Howells, MD      . cloNIDine (CATAPRES - Dosed in mg/24 hr) patch 0.3 mg  0.3 mg Transdermal Weekly Shanda Howells, MD      . enoxaparin (LOVENOX) injection 30 mg  30 mg Subcutaneous Q24H Shanda Howells, MD      . furosemide (LASIX) tablet 20 mg  20 mg Oral Daily PRN Shanda Howells, MD      . gabapentin (NEURONTIN) tablet 600 mg  600 mg Oral TID Shanda Howells, MD      . hydrALAZINE (APRESOLINE) tablet 100 mg  100 mg Oral BID Shanda Howells, MD      . isosorbide mononitrate (IMDUR) 24 hr tablet 60 mg  60 mg Oral QPM Shanda Howells, MD      . labetalol (NORMODYNE) tablet 300 mg  300 mg Oral BID Shanda Howells, MD      . levothyroxine (SYNTHROID, LEVOTHROID) tablet 50 mcg  50 mcg Oral QHS Shanda Howells, MD      . nitroGLYCERIN (NITROSTAT) SL tablet 0.4 mg  0.4 mg Sublingual Q5 min PRN Shanda Howells, MD      . oxyCODONE-acetaminophen (PERCOCET/ROXICET) 5-325 MG per tablet 1 tablet  1 tablet Oral Q4H PRN Shanda Howells, MD      . pantoprazole (PROTONIX) EC tablet 80 mg  80 mg Oral Daily Shanda Howells, MD      . rOPINIRole (REQUIP) tablet 3 mg  3 mg Oral QHS Shanda Howells, MD      . senna-docusate (Senokot-S) tablet 1 tablet  1 tablet Oral QHS PRN Shanda Howells, MD      . triamterene-hydrochlorothiazide (DYAZIDE) 37.5-25 MG per capsule 1 capsule  1 capsule Oral q morning - 10a Shanda Howells, MD      . zolpidem [facility]) tablet 10 mg  10 mg Oral QHS Shanda Howells, MD       Current Outpatient Prescriptions  Medication Sig Dispense Refill  . acetaminophen-codeine (TYLENOL #3) 300-30 MG per tablet Take 1-2 tablets by mouth every 6 (six) hours as needed.  20 tablet  0  . carisoprodol (SOMA) 350 MG tablet Take 350 mg by mouth 3 (three) times daily.      . cloNIDine (CATAPRES - DOSED IN MG/24 HR) 0.3 mg/24hr patch Place 1 patch (0.3 mg total) onto the skin once a week. Sundays  4 patch  0  . dexamethasone (DECADRON) 6 MG tablet 1 po bid with food  12 tablet  0  . dexlansoprazole (DEXILANT) 60 MG capsule Take 1 capsule (60 mg total) by mouth every evening.  30 capsule  0  . EPINEPHrine (EPI-PEN) 0.3 mg/0.3 mL DEVI Inject 0.3 mg into the muscle once.      . furosemide (LASIX) 20 MG tablet Take 1 tablet (20 mg total) by mouth daily as needed for fluid.  30 tablet  0  . gabapentin (NEURONTIN) 600 MG tablet Take 1 tablet (600 mg total) by mouth 3 (three) times daily.  90 tablet  0  . hydrALAZINE (APRESOLINE) 100 MG tablet Take 1 tablet (100 mg total) by mouth 2 (two) times daily.  60 tablet  0  . isosorbide mononitrate (IMDUR) 60 MG 24 hr tablet Take 1 tablet (60 mg total) by mouth every evening.  30 tablet  0  . labetalol (NORMODYNE) 300 MG tablet Take 1 tablet (300 mg total) by mouth 2 (two) times daily.  60 tablet  0  . levothyroxine (SYNTHROID, LEVOTHROID) 50 MCG tablet Take 1 tablet (50 mcg total) by mouth at bedtime.  30 tablet  0  . methocarbamol (ROBAXIN) 500 MG tablet Take 1 tablet (500 mg total) by mouth 3 (three) times daily.  21 tablet  0  . oxyCODONE-acetaminophen (PERCOCET/ROXICET)  5-325 MG per tablet Take 1 tablet by mouth every 4 (four) hours as needed for severe pain (for pain).      Marland Kitchen rOPINIRole (REQUIP) 3 MG tablet Take 1 tablet (3 mg total) by mouth at bedtime.  30 tablet  0  . triamterene-hydrochlorothiazide (DYAZIDE) 37.5-25 MG per capsule Take 1 each (1 capsule total) by mouth every morning.  30 capsule  0  . zolpidem (AMBIEN) 10 MG tablet Take 10 mg by mouth at bedtime.       Review Of Systems: 12 point ROS negative except as noted above in HPI.  Physical Exam: Filed Vitals:   03/18/14 2330  BP: 187/85  Pulse: 72  Temp:   Resp: 16    General: cooperative and morbidly obese, L sided facial droop  HEENT: PERRLA and extra ocular movement intact Heart: S1, S2 normal, no murmur, rub or gallop, regular rate and rhythm Lungs: clear to auscultation, no wheezes or rales and unlabored breathing Abdomen: abdomen is soft without significant tenderness, masses, organomegaly or guarding Extremities: + L sided weakness Skin:no rashes Neurology: L sided facial droop, L sided weakness and decreased sensation  Labs and Imaging: Lab Results  Component Value Date/Time   NA 140 03/18/2014  9:55 PM   K 3.5* 03/18/2014  9:55 PM   CL 106 03/18/2014  9:55 PM   CO2 22 03/18/2014  9:55 PM   BUN 16 03/18/2014  9:55 PM   CREATININE 0.81 03/18/2014  9:55 PM   GLUCOSE 120* 03/18/2014  9:55 PM   Lab Results  Component Value Date  WBC 8.5 03/18/2014   HGB 13.4 03/18/2014   HCT 39.5 03/18/2014   MCV 88.4 03/18/2014   PLT 265 03/18/2014    Ct Angio Head W/cm &/or Wo Cm  03/18/2014   CLINICAL DATA:  One day history of headache, sharp head pain beginning 1 hr ago. One day history of numbness and weakness LEFT extremities. Past medical history of hypertension and transient ischemic attack.  EXAM: CT ANGIOGRAPHY HEAD AND NECK  TECHNIQUE: Multidetector CT imaging of the head and neck was performed using the standard protocol during bolus administration of intravenous  contrast. Multiplanar CT image reconstructions and MIPs were obtained to evaluate the vascular anatomy. Carotid stenosis measurements (when applicable) are obtained utilizing NASCET criteria, using the distal internal carotid diameter as the denominator.  CONTRAST:  80mL OMNIPAQUE IOHEXOL 350 MG/ML SOLN  COMPARISON:  CT of the head March 18, 2014 at 2129 hr and CT of the head February 27, 2014  FINDINGS: CTA HEAD FINDINGS  Anterior circulation: Normal appearance of the cervical internal carotid arteries, petrous, cavernous and supra clinoid internal carotid arteries. Anterior communicating artery is not distinctly identified. Normal appearance of the anterior and middle cerebral arteries.  Posterior circulation: RIGHT vertebral artery is dominant with normal appearance of the vertebral arteries, vertebrobasilar junction and basilar artery, as well as main branch vessels. Normal appearance of the posterior cerebral arteries. Tiny RIGHT posterior communicating artery present.  No large vessel occlusion, hemodynamically significant stenosis, dissection, luminal irregularity, contrast extravasation or aneurysm within the anterior nor posterior circulation.  Review of the MIP images confirms the above findings.  CTA NECK FINDINGS  Normal appearance of the thoracic arch, normal branch pattern. Mild calcific atherosclerosis The origins of the innominate, left Common carotid artery and subclavian artery are widely patent.  Bilateral Common carotid arteries are widely patent, coursing in a straight line fashion. Eccentric intimal thickening calcific atherosclerosis of the carotid bulbs measuring up to 3 mm on the LEFT without hemodynamically significant stenosis by NASCET criteria. Normal appearance of the included internal carotid arteries.  RIGHT vertebral artery is dominant. Normal appearance of the vertebral arteries, which appear widely patent.  No hemodynamically significant stenosis by NASCET criteria. No  dissection, no pseudoaneurysm. No abnormal luminal irregularity. No contrast extravasation.  Soft tissues are unremarkable. No acute osseous process though bone windows have not been submitted.  Review of the MIP images confirms the above findings.  IMPRESSION: CTA HEAD: No acute vascular process nor hemodynamically significant stenosis.  CTA NECK: No acute vascular process. Atherosclerosis of the carotid bulbs without hemodynamically significant stenosis.   Electronically Signed   By: Elon Alas   On: 03/18/2014 23:31   Ct Head (brain) Wo Contrast  03/18/2014   CLINICAL DATA:  Acute onset headache 1 hr ago.  EXAM: CT HEAD WITHOUT CONTRAST  TECHNIQUE: Contiguous axial images were obtained from the base of the skull through the vertex without intravenous contrast.  COMPARISON:  Head CT scan 02/27/2014.  FINDINGS: There is no evidence of acute intracranial abnormality including infarct, hemorrhage, mass lesion, mass effect, midline shift or abnormal extra-axial fluid collection. No hydrocephalus or pneumocephalus. The calvarium is intact.  IMPRESSION: Negative head CT.  Critical Value/emergent results were called by telephone at the time of interpretation on 03/18/2014 at 9:33 pm to Dr. Nat Christen , who verbally acknowledged these results.   Electronically Signed   By: Inge Rise M.D.   On: 03/18/2014 21:33   Ct Angio Neck W/cm &/or Wo/cm  03/18/2014  CLINICAL DATA:  One day history of headache, sharp head pain beginning 1 hr ago. One day history of numbness and weakness LEFT extremities. Past medical history of hypertension and transient ischemic attack.  EXAM: CT ANGIOGRAPHY HEAD AND NECK  TECHNIQUE: Multidetector CT imaging of the head and neck was performed using the standard protocol during bolus administration of intravenous contrast. Multiplanar CT image reconstructions and MIPs were obtained to evaluate the vascular anatomy. Carotid stenosis measurements (when applicable) are obtained  utilizing NASCET criteria, using the distal internal carotid diameter as the denominator.  CONTRAST:  35mL OMNIPAQUE IOHEXOL 350 MG/ML SOLN  COMPARISON:  CT of the head March 18, 2014 at 2129 hr and CT of the head February 27, 2014  FINDINGS: CTA HEAD FINDINGS  Anterior circulation: Normal appearance of the cervical internal carotid arteries, petrous, cavernous and supra clinoid internal carotid arteries. Anterior communicating artery is not distinctly identified. Normal appearance of the anterior and middle cerebral arteries.  Posterior circulation: RIGHT vertebral artery is dominant with normal appearance of the vertebral arteries, vertebrobasilar junction and basilar artery, as well as main branch vessels. Normal appearance of the posterior cerebral arteries. Tiny RIGHT posterior communicating artery present.  No large vessel occlusion, hemodynamically significant stenosis, dissection, luminal irregularity, contrast extravasation or aneurysm within the anterior nor posterior circulation.  Review of the MIP images confirms the above findings.  CTA NECK FINDINGS  Normal appearance of the thoracic arch, normal branch pattern. Mild calcific atherosclerosis The origins of the innominate, left Common carotid artery and subclavian artery are widely patent.  Bilateral Common carotid arteries are widely patent, coursing in a straight line fashion. Eccentric intimal thickening calcific atherosclerosis of the carotid bulbs measuring up to 3 mm on the LEFT without hemodynamically significant stenosis by NASCET criteria. Normal appearance of the included internal carotid arteries.  RIGHT vertebral artery is dominant. Normal appearance of the vertebral arteries, which appear widely patent.  No hemodynamically significant stenosis by NASCET criteria. No dissection, no pseudoaneurysm. No abnormal luminal irregularity. No contrast extravasation.  Soft tissues are unremarkable. No acute osseous process though bone windows have  not been submitted.  Review of the MIP images confirms the above findings.  IMPRESSION: CTA HEAD: No acute vascular process nor hemodynamically significant stenosis.  CTA NECK: No acute vascular process. Atherosclerosis of the carotid bulbs without hemodynamically significant stenosis.   Electronically Signed   By: Elon Alas   On: 03/18/2014 23:31           Shanda Howells MD  Pager: [phone]

## 2014-03-19 NOTE — Progress Notes (Signed)
[facility] PROGRESS NOTE  Katrina [name]:[id number] DOB: [birth year] DOA: 03/18/2014 PCP: No PCP Per Patient  Assessment/Plan: Left-sided weakness/facial droop -Interestingly, MRI is negative for CVA. -Nonetheless, we'll continue stroke workup to include 2-D echo, carotid Dopplers. -Has been started on aspirin, statin. -PT is recommending home health PT. -Await swallowing evaluation. -Await neurology recommendations.  Tobacco abuse -Counseled on cessation  Hypertension -BP remains elevated. -Patient admits to being noncompliant at home with medications. -Continue imdur, labetalol, Maxzide.  Type 2 diabetes -Well controlled  Code Status: Full code Family Communication: Daughter at bedside updated on plan of care  Disposition Plan: Likely home   Consultants:  Neurology   Antibiotics:  None   Subjective: Feels weak.  Objective: Filed Vitals:   03/19/14 0800 03/19/14 0900 03/19/14 1000 03/19/14 1257  BP: 167/67 165/72  188/91  Pulse: 69 64 66 62  Temp: 97.8 F (36.6 C)   97.8 F (36.6 C)  TempSrc: Oral   Oral  Resp: 16 16 23 18   Height:      Weight:      SpO2: 95% 98% 97% 100%   No intake or output data in the 24 hours ending 03/19/14 1534 Filed Weights   03/18/14 2058 03/19/14 0125 03/19/14 0500  Weight: 97.07 kg (214 lb) 98.3 kg (216 lb 11.4 oz) 98.3 kg (216 lb 11.4 oz)    Exam:   General:  Alert, awake, oriented x3  Cardiovascular: Regular rate and rhythm  Respiratory: Clear to auscultation bilaterally  Abdomen: Soft, nontender, nondistended, positive bowel sounds  Extremities: No clubbing, cyanosis or edema, positive pulses   Neurologic:  Left-sided facial droop, left greater than right upper and lower extremity weakness  Data Reviewed: Basic Metabolic Panel:  Recent Labs Lab 03/18/14 2155  NA 140  K 3.5*  CL 106  CO2 22  GLUCOSE 120*  BUN 16  CREATININE 0.81  CALCIUM 8.8   Liver Function Tests: No results  found for this basename: AST, ALT, ALKPHOS, BILITOT, PROT, ALBUMIN,  in the last 168 hours No results found for this basename: LIPASE, AMYLASE,  in the last 168 hours No results found for this basename: AMMONIA,  in the last 168 hours CBC:  Recent Labs Lab 03/18/14 2155  WBC 8.5  NEUTROABS 3.2  HGB 13.4  HCT 39.5  MCV 88.4  PLT 265   Cardiac Enzymes:  Recent Labs Lab 03/18/14 2155 03/19/14 0449 03/19/14 0958  TROPONINI <0.30 <0.30 <0.30   BNP (last 3 results)  Recent Labs  09/22/13 2200  PROBNP 259.2*   CBG:  Recent Labs Lab 03/18/14 2110 03/19/14 0452 03/19/14 0753 03/19/14 1428  GLUCAP 114* 105* 89 113*    Recent Results (from the past 240 hour(s))  MRSA PCR SCREENING     Status: None   Collection Time    03/19/14  1:30 AM      Result Value Ref Range Status   MRSA by PCR NEGATIVE  NEGATIVE Final   Comment:            The GeneXpert MRSA Assay (FDA     approved for NASAL specimens     only), is one component of a     comprehensive MRSA colonization     surveillance program. It is not     intended to diagnose MRSA     infection nor to guide or     monitor treatment for     MRSA infections.     Studies: Ct Angio Head  W/cm &/or Wo Cm  03/18/2014   CLINICAL DATA:  One day history of headache, sharp head pain beginning 1 hr ago. One day history of numbness and weakness LEFT extremities. Past medical history of hypertension and transient ischemic attack.  EXAM: CT ANGIOGRAPHY HEAD AND NECK  TECHNIQUE: Multidetector CT imaging of the head and neck was performed using the standard protocol during bolus administration of intravenous contrast. Multiplanar CT image reconstructions and MIPs were obtained to evaluate the vascular anatomy. Carotid stenosis measurements (when applicable) are obtained utilizing NASCET criteria, using the distal internal carotid diameter as the denominator.  CONTRAST:  68mL OMNIPAQUE IOHEXOL 350 MG/ML SOLN  COMPARISON:  CT of the head  March 18, 2014 at 2129 hr and CT of the head February 27, 2014  FINDINGS: CTA HEAD FINDINGS  Anterior circulation: Normal appearance of the cervical internal carotid arteries, petrous, cavernous and supra clinoid internal carotid arteries. Anterior communicating artery is not distinctly identified. Normal appearance of the anterior and middle cerebral arteries.  Posterior circulation: RIGHT vertebral artery is dominant with normal appearance of the vertebral arteries, vertebrobasilar junction and basilar artery, as well as main branch vessels. Normal appearance of the posterior cerebral arteries. Tiny RIGHT posterior communicating artery present.  No large vessel occlusion, hemodynamically significant stenosis, dissection, luminal irregularity, contrast extravasation or aneurysm within the anterior nor posterior circulation.  Review of the MIP images confirms the above findings.  CTA NECK FINDINGS  Normal appearance of the thoracic arch, normal branch pattern. Mild calcific atherosclerosis The origins of the innominate, left Common carotid artery and subclavian artery are widely patent.  Bilateral Common carotid arteries are widely patent, coursing in a straight line fashion. Eccentric intimal thickening calcific atherosclerosis of the carotid bulbs measuring up to 3 mm on the LEFT without hemodynamically significant stenosis by NASCET criteria. Normal appearance of the included internal carotid arteries.  RIGHT vertebral artery is dominant. Normal appearance of the vertebral arteries, which appear widely patent.  No hemodynamically significant stenosis by NASCET criteria. No dissection, no pseudoaneurysm. No abnormal luminal irregularity. No contrast extravasation.  Soft tissues are unremarkable. No acute osseous process though bone windows have not been submitted.  Review of the MIP images confirms the above findings.  IMPRESSION: CTA HEAD: No acute vascular process nor hemodynamically significant stenosis.   CTA NECK: No acute vascular process. Atherosclerosis of the carotid bulbs without hemodynamically significant stenosis.   Electronically Signed   By: Elon Alas   On: 03/18/2014 23:31   Dg Chest 2 View  03/19/2014   CLINICAL DATA:  Intermittent chest pain for 1 day. History of smoking, CAD and COPD. Initial encounter.  EXAM: CHEST  2 VIEW  COMPARISON:  09/22/2013; 04/02/2012  FINDINGS: Grossly unchanged cardiac silhouette and mediastinal contours. Evaluation retrosternal clear space obscured secondary to overlying soft tissues. The lungs remain hyperexpanded. Grossly unchanged bibasilar heterogeneous opacities, left greater than right, likely atelectasis. No new focal airspace opacities. No pleural effusion or pneumothorax. No evidence of edema. No acute osseus abnormalities.  IMPRESSION: Hyperexpanded lungs and bibasilar atelectasis without definite acute cardiopulmonary disease.   Electronically Signed   By: Sandi Mariscal M.D.   On: 03/19/2014 00:30   Ct Head (brain) Wo Contrast  03/18/2014   CLINICAL DATA:  Acute onset headache 1 hr ago.  EXAM: CT HEAD WITHOUT CONTRAST  TECHNIQUE: Contiguous axial images were obtained from the base of the skull through the vertex without intravenous contrast.  COMPARISON:  Head CT scan 02/27/2014.  FINDINGS: There is  no evidence of acute intracranial abnormality including infarct, hemorrhage, mass lesion, mass effect, midline shift or abnormal extra-axial fluid collection. No hydrocephalus or pneumocephalus. The calvarium is intact.  IMPRESSION: Negative head CT.  Critical Value/emergent results were called by telephone at the time of interpretation on 03/18/2014 at 9:33 pm to Dr. Nat Christen , who verbally acknowledged these results.   Electronically Signed   By: Inge Rise M.D.   On: 03/18/2014 21:33   Ct Angio Neck W/cm &/or Wo/cm  03/18/2014   CLINICAL DATA:  One day history of headache, sharp head pain beginning 1 hr ago. One day history of numbness and  weakness LEFT extremities. Past medical history of hypertension and transient ischemic attack.  EXAM: CT ANGIOGRAPHY HEAD AND NECK  TECHNIQUE: Multidetector CT imaging of the head and neck was performed using the standard protocol during bolus administration of intravenous contrast. Multiplanar CT image reconstructions and MIPs were obtained to evaluate the vascular anatomy. Carotid stenosis measurements (when applicable) are obtained utilizing NASCET criteria, using the distal internal carotid diameter as the denominator.  CONTRAST:  52mL OMNIPAQUE IOHEXOL 350 MG/ML SOLN  COMPARISON:  CT of the head March 18, 2014 at 2129 hr and CT of the head February 27, 2014  FINDINGS: CTA HEAD FINDINGS  Anterior circulation: Normal appearance of the cervical internal carotid arteries, petrous, cavernous and supra clinoid internal carotid arteries. Anterior communicating artery is not distinctly identified. Normal appearance of the anterior and middle cerebral arteries.  Posterior circulation: RIGHT vertebral artery is dominant with normal appearance of the vertebral arteries, vertebrobasilar junction and basilar artery, as well as main branch vessels. Normal appearance of the posterior cerebral arteries. Tiny RIGHT posterior communicating artery present.  No large vessel occlusion, hemodynamically significant stenosis, dissection, luminal irregularity, contrast extravasation or aneurysm within the anterior nor posterior circulation.  Review of the MIP images confirms the above findings.  CTA NECK FINDINGS  Normal appearance of the thoracic arch, normal branch pattern. Mild calcific atherosclerosis The origins of the innominate, left Common carotid artery and subclavian artery are widely patent.  Bilateral Common carotid arteries are widely patent, coursing in a straight line fashion. Eccentric intimal thickening calcific atherosclerosis of the carotid bulbs measuring up to 3 mm on the LEFT without hemodynamically significant  stenosis by NASCET criteria. Normal appearance of the included internal carotid arteries.  RIGHT vertebral artery is dominant. Normal appearance of the vertebral arteries, which appear widely patent.  No hemodynamically significant stenosis by NASCET criteria. No dissection, no pseudoaneurysm. No abnormal luminal irregularity. No contrast extravasation.  Soft tissues are unremarkable. No acute osseous process though bone windows have not been submitted.  Review of the MIP images confirms the above findings.  IMPRESSION: CTA HEAD: No acute vascular process nor hemodynamically significant stenosis.  CTA NECK: No acute vascular process. Atherosclerosis of the carotid bulbs without hemodynamically significant stenosis.   Electronically Signed   By: Elon Alas   On: 03/18/2014 23:31   Mr Jodene Nam Head Wo Contrast  03/19/2014   CLINICAL DATA:  53 year old hypertensive diabetic female with hyperlipidemia presenting with headache and numbness/ weakness left extremity. Subsequent encounter.  EXAM: MRI HEAD WITHOUT CONTRAST  MRA HEAD WITHOUT CONTRAST  TECHNIQUE: Multiplanar, multiecho pulse sequences of the brain and surrounding structures were obtained without intravenous contrast. Angiographic images of the head were obtained using MRA technique without contrast.  COMPARISON:  CT angiogram head and neck 03/18/2014.  FINDINGS: MRI HEAD FINDINGS  No acute infarct.  No intracranial hemorrhage. The  questionable increased signal within sulci in the occipital region on series 6 is not confirmed as a true abnormality on other sequences and therefore may be related to motion artifact rather than subarachnoid hemorrhage or meningitis.  Moderate punctate and patchy nonspecific white matter type changes. Given the patient's history of smoking, diabetes and high blood pressure, findings are most likely related to result of small vessel disease rather than demyelinating process, vasculitis or inflammatory process.  No  hydrocephalus.  No intracranial mass lesion noted on this unenhanced exam.  Cervical medullary junction, pituitary region, pineal region and orbital structures unremarkable.  Major intracranial vascular structures are patent.  Minimal polypoid opacification medial aspect left maxillary sinus. Opacification posterior right ethmoid sinus air cell.  Tiny right-sided Thornwaldt cyst.  MRA HEAD FINDINGS  Artifact extends through the petrous, cavernous and pre cavernous segment of the internal carotid artery bilaterally more notable on left. No significant stenosis is seen in this region on the recent CT angiogram.  Otherwise anterior circulation without medium or large size vessel significant stenosis or occlusion.  Middle cerebral artery branch vessel irregularity bilaterally.  Right vertebral artery is dominant. Mild ectasia distal right vertebral artery. Mild narrowing distal left vertebral artery.  No significant stenosis of the basilar artery.  Nonvisualized right anterior inferior cerebellar artery.  Mild narrowing and irregularity of the superior cerebellar artery bilaterally.  Posterior cerebral artery distal branch vessel irregularity bilaterally.  No aneurysm detected.  IMPRESSION: MRI HEAD:  No acute infarct.  Moderate punctate and patchy nonspecific white matter type changes. Given the patient's history of smoking, diabetes and high blood pressure, findings are most likely related to result of small vessel disease rather than demyelinating process, vasculitis or inflammatory process.  No intracranial hemorrhage. The questionable increased signal within sulci in the occipital region on series 6 is not confirmed as a true abnormality on other sequences and therefore may be related to motion artifact rather than subarachnoid hemorrhage or meningitis.  MRA HEAD:  Motion artifact as noted above.  Mild branch vessel irregularity.   Electronically Signed   By: Chauncey Cruel M.D.   On: 03/19/2014 13:32   Mr Brain Wo  Contrast  03/19/2014   CLINICAL DATA:  53 year old hypertensive diabetic female with hyperlipidemia presenting with headache and numbness/ weakness left extremity. Subsequent encounter.  EXAM: MRI HEAD WITHOUT CONTRAST  MRA HEAD WITHOUT CONTRAST  TECHNIQUE: Multiplanar, multiecho pulse sequences of the brain and surrounding structures were obtained without intravenous contrast. Angiographic images of the head were obtained using MRA technique without contrast.  COMPARISON:  CT angiogram head and neck 03/18/2014.  FINDINGS: MRI HEAD FINDINGS  No acute infarct.  No intracranial hemorrhage. The questionable increased signal within sulci in the occipital region on series 6 is not confirmed as a true abnormality on other sequences and therefore may be related to motion artifact rather than subarachnoid hemorrhage or meningitis.  Moderate punctate and patchy nonspecific white matter type changes. Given the patient's history of smoking, diabetes and high blood pressure, findings are most likely related to result of small vessel disease rather than demyelinating process, vasculitis or inflammatory process.  No hydrocephalus.  No intracranial mass lesion noted on this unenhanced exam.  Cervical medullary junction, pituitary region, pineal region and orbital structures unremarkable.  Major intracranial vascular structures are patent.  Minimal polypoid opacification medial aspect left maxillary sinus. Opacification posterior right ethmoid sinus air cell.  Tiny right-sided Thornwaldt cyst.  MRA HEAD FINDINGS  Artifact extends through the petrous, cavernous and  pre cavernous segment of the internal carotid artery bilaterally more notable on left. No significant stenosis is seen in this region on the recent CT angiogram.  Otherwise anterior circulation without medium or large size vessel significant stenosis or occlusion.  Middle cerebral artery branch vessel irregularity bilaterally.  Right vertebral artery is dominant. Mild  ectasia distal right vertebral artery. Mild narrowing distal left vertebral artery.  No significant stenosis of the basilar artery.  Nonvisualized right anterior inferior cerebellar artery.  Mild narrowing and irregularity of the superior cerebellar artery bilaterally.  Posterior cerebral artery distal branch vessel irregularity bilaterally.  No aneurysm detected.  IMPRESSION: MRI HEAD:  No acute infarct.  Moderate punctate and patchy nonspecific white matter type changes. Given the patient's history of smoking, diabetes and high blood pressure, findings are most likely related to result of small vessel disease rather than demyelinating process, vasculitis or inflammatory process.  No intracranial hemorrhage. The questionable increased signal within sulci in the occipital region on series 6 is not confirmed as a true abnormality on other sequences and therefore may be related to motion artifact rather than subarachnoid hemorrhage or meningitis.  MRA HEAD:  Motion artifact as noted above.  Mild branch vessel irregularity.   Electronically Signed   By: Chauncey Cruel M.D.   On: 03/19/2014 13:32    Scheduled Meds: . aspirin EC  81 mg Oral Daily  . cloNIDine  0.3 mg Transdermal Weekly  . enoxaparin (LOVENOX) injection  40 mg Subcutaneous Q24H  . gabapentin  600 mg Oral TID  . hydrALAZINE  100 mg Oral BID  . insulin aspart  0-9 Units Subcutaneous 6 times per day  . isosorbide mononitrate  60 mg Oral QPM  . labetalol  300 mg Oral BID  . levothyroxine  50 mcg Oral QHS  . nicotine  21 mg Transdermal Daily  . pantoprazole  80 mg Oral Daily  . rOPINIRole  3 mg Oral QHS  . rosuvastatin  10 mg Oral q1800  . triamterene-hydrochlorothiazide  1 tablet Oral Daily  . zolpidem  5 mg Oral QHS   Continuous Infusions:   Active Problems:   HTN (hypertension)   Tobacco abuse   DM2 (diabetes mellitus, type 2)   CVA (cerebral infarction)    Time spent: 35 minutes. Greater than 50% of this time was spent in direct  contact with the patient coordinating care.    Lelon Frohlich  [facility] Pager [phone]  If 7PM-7AM, please contact night-coverage at [email or web address], password [facility] 03/19/2014, 3:34 PM  LOS: 1 day

## 2014-03-19 NOTE — ED Notes (Signed)
Report given to [facility] on 300.  Katrina [name] called back to state she does not feel comfortable taking pt with a BP of 209/96; Dr. Dwaine Gale called and informed of nurses' concern; He verbally stated he would put in an order for pt to go to step-down unit. Pt informed of room status

## 2014-03-19 NOTE — Progress Notes (Signed)
Called report to Leroy Kennedy, RN on dept 300.  Verbalized understanding.  Pt to be transferred to rm [id number] when patient returns from radiology. [place], Eulis Canner 03/19/2014

## 2014-03-19 NOTE — Care Management Note (Signed)
    Page 1 of 1   03/19/2014     1:17:19 PM CARE MANAGEMENT NOTE 03/19/2014  Patient:  OPHIE, BURROWES   Account Number:  [id number]  Date Initiated:  03/19/2014  Documentation initiated by:  Theophilus Kinds  Subjective/Objective Assessment:   Pt admitted from home with CVA. Pt lives with her daughter and will return home at discharge. Pt has a cane for prn use. Pts PCP is Dr. Legrand Rams. Pt would like names of PCP accepting new pts.     Action/Plan:   OT recommends outpt OT. Awaiting PT recommendations. Will give pt list of MD's taking new pts.   Anticipated DC Date:  03/20/2014   Anticipated DC Plan:  [place]  CM consult      Choice offered to / List presented to:             Status of service:  In process, will continue to follow Medicare Important Message given?   (If response is "NO", the following Medicare IM given date fields will be blank) Date Medicare IM given:   Medicare IM given by:   Date Additional Medicare IM given:   Additional Medicare IM given by:    Discharge Disposition:    Per UR Regulation:    If discussed at Long Length of Stay Meetings, dates discussed:    Comments:  03/19/14 [place], RN BSN CM

## 2014-03-19 NOTE — Progress Notes (Signed)
Pt just left for MRI.  Will attempt to see patient at a later date.

## 2014-03-19 NOTE — Progress Notes (Signed)
Pt requesting to eat and states that she is very hungry.  Spoke with MD and still waiting speech consult.  MD stated that is was ok to give the patient something to eat.

## 2014-03-19 NOTE — Evaluation (Signed)
Occupational Therapy Evaluation Patient Details Name: Katrina [name] MRN: [id number] DOB: [birth year] Today's Date: 03/19/2014    History of Present Illness This is a 53 y.o. year old female with significant past medical history of morbid obesity, TIA, non obstructive CAD, gastric ulcer, type 2 DM, HTN presenting with CVA, chest pain. Pt reports intermittent L sided weakness over the past 12-24 hours. Sxs have waxed and waned over the course of the day. States that she has also had mild central chest pain over the course of the day as well. Intermittent in nature. No radiation. No nausea or diaphoresis. Still smoking 1 1/2 PPD.   Presented to ER with L sided weakness at AP. Code stroke called. EDP called teleneuro who felt pt was outside of intervention window. CTA head and neck obtained that was negative for acute vascular process. Head CT also WNL. Recommendation for admission for CVA workup. Pt has not taken NTG because of assd headache. Also states that she does not take ASA because of hx/o gastric ulcer 3-4 years ago. Trop and EKG WNL on presentation.      Clinical Impression   Pt is presenting to acute OT with above situation.  She has decreased strength in LUE, and is unable to take any resistance.  Pt demonstrates good skills with ADLs assessed, but will required extra time and min guard/min assist for Bilateral UE tasks and standing tasks.  Pt will benefit from continued OT services, and recommend outpatient OT services for regaining strength of LUE, pending PTs assessment of pt's safety with stairs in the home.    Follow Up Recommendations  Outpatient OT (pending PT's assessment of pt's ability with stairs)    Equipment Recommendations  Tub/shower seat    Recommendations for Other Services       Precautions / Restrictions Precautions Precautions: Fall Restrictions Weight Bearing Restrictions: No      Mobility Bed Mobility Overal bed mobility: Modified Independent                 Transfers Overall transfer level: Needs assistance Equipment used: Rolling walker (2 wheeled) Transfers: Sit to/from Stand Sit to Stand: Min assist;Min guard              Balance                                            ADL Overall ADL's : Needs assistance/impaired Eating/Feeding: Supervision/ safety   Grooming: Supervision/safety;Minimal assistance Grooming Details (indicate cue type and reason): when using RUE only.  May require min assist for bilateral reaching tasks             Lower Body Dressing: Min guard;Sit to/from stand   Toilet Transfer: Min guard;Minimal assistance;RW;BSC           Functional mobility during ADLs: Minimal assistance;Min guard;Rolling walker       Vision                     Perception     Praxis      Pertinent Vitals/Pain Pain Assessment: 0-10 Pain Score: 9  Pain Location: Headache Pain Descriptors / Indicators: Aching Pain Intervention(s): Premedicated before session;Monitored during session (Pt reports headache has improved significanly from last night)     Hand Dominance Right   Extremity/Trunk Assessment Upper Extremity Assessment Upper Extremity Assessment: LUE deficits/detail LUE Deficits / Details:  3/5 shulder and elbow; 3+/5 wrist.  Decreased grip strength. LUE Sensation: decreased light touch (Decreased light touch, deep presure, sharp/dull.  WFL Proprioception.)   Lower Extremity Assessment Lower Extremity Assessment: Defer to PT evaluation       Communication Communication Communication: No difficulties   Cognition Arousal/Alertness: Awake/alert Behavior During Therapy: WFL for tasks assessed/performed Overall Cognitive Status: Within Functional Limits for tasks assessed (Pt was disoriented to day of the week (thought it was Thursday), but was able to state month and president)                     General Comments       Exercises       Shoulder  Instructions      Home Living Family/patient expects to be discharged to:: Private residence Living Arrangements: Children (Daughter) Available Help at Discharge: Family;Available PRN/intermittently Type of Home: House Home Access: Stairs to enter Entrance Stairs-Number of Steps: 2   Home Layout: Multi-level Alternate Level Stairs-Number of Steps: 5 steps to level of bedroom and bathroom   Bathroom Shower/Tub: Teacher, early years/pre: Standard     Home Equipment: Cane - single point          Prior Functioning/Environment Level of Independence: Independent        Comments: used cane intermittently for ambulation.  pt states she was independent with all ADLs and IADLs.    OT Diagnosis: Hemiplegia non-dominant side   OT Problem List: Decreased strength;Impaired UE functional use;Decreased coordination;Decreased activity tolerance   OT Treatment/Interventions: Self-care/ADL training;Therapeutic exercise;Neuromuscular education;Energy conservation;Patient/family education;Therapeutic activities    OT Goals(Current goals can be found in the care plan section) Acute Rehab OT Goals Patient Stated Goal: No OT goals stated OT Goal Formulation: With patient Time For Goal Achievement: 04/02/14 Potential to Achieve Goals: Good ADL Goals Pt Will Transfer to Toilet: with supervision Pt/caregiver will Perform Home Exercise Program: Increased strength;Left upper extremity  OT Frequency: Min 2X/week   Barriers to D/C:            Co-evaluation              End of Session Equipment Utilized During Treatment: Gait belt;Rolling walker  Activity Tolerance: Patient tolerated treatment well Patient left: in bed;with family/visitor present;with call bell/phone within reach;with bed alarm set   Time: 0920-0948 OT Time Calculation (min): 28 min Charges:  OT General Charges $OT Visit: 1 Procedure OT Evaluation $Initial OT Evaluation Tier I: 1 Procedure G-Codes:      Bea Graff Faduma Cho, MS, OTR/L [place] [phone] 03/19/2014, 11:44 AM

## 2014-03-19 NOTE — ED Notes (Signed)
Report given to Adella Hare RN in ICU

## 2014-03-20 ENCOUNTER — Encounter (HOSPITAL_COMMUNITY): Payer: Self-pay | Admitting: Internal Medicine

## 2014-03-20 ENCOUNTER — Inpatient Hospital Stay (HOSPITAL_COMMUNITY): Payer: Medicare Other

## 2014-03-20 DIAGNOSIS — I5189 Other ill-defined heart diseases: Secondary | ICD-10-CM | POA: Diagnosis present

## 2014-03-20 DIAGNOSIS — I679 Cerebrovascular disease, unspecified: Secondary | ICD-10-CM

## 2014-03-20 DIAGNOSIS — Z720 Tobacco use: Secondary | ICD-10-CM

## 2014-03-20 DIAGNOSIS — IMO0002 Reserved for concepts with insufficient information to code with codable children: Secondary | ICD-10-CM | POA: Diagnosis present

## 2014-03-20 DIAGNOSIS — Z0389 Encounter for observation for other suspected diseases and conditions ruled out: Secondary | ICD-10-CM

## 2014-03-20 DIAGNOSIS — K279 Peptic ulcer, site unspecified, unspecified as acute or chronic, without hemorrhage or perforation: Secondary | ICD-10-CM | POA: Diagnosis present

## 2014-03-20 DIAGNOSIS — M6289 Other specified disorders of muscle: Secondary | ICD-10-CM

## 2014-03-20 DIAGNOSIS — I519 Heart disease, unspecified: Secondary | ICD-10-CM

## 2014-03-20 DIAGNOSIS — E1140 Type 2 diabetes mellitus with diabetic neuropathy, unspecified: Secondary | ICD-10-CM | POA: Diagnosis present

## 2014-03-20 DIAGNOSIS — R531 Weakness: Secondary | ICD-10-CM | POA: Diagnosis present

## 2014-03-20 DIAGNOSIS — R202 Paresthesia of skin: Secondary | ICD-10-CM

## 2014-03-20 DIAGNOSIS — IMO0001 Reserved for inherently not codable concepts without codable children: Secondary | ICD-10-CM | POA: Diagnosis present

## 2014-03-20 DIAGNOSIS — E1165 Type 2 diabetes mellitus with hyperglycemia: Secondary | ICD-10-CM

## 2014-03-20 LAB — GLUCOSE, CAPILLARY
GLUCOSE-CAPILLARY: 120 mg/dL — AB (ref 70–99)
GLUCOSE-CAPILLARY: 97 mg/dL (ref 70–99)
Glucose-Capillary: 117 mg/dL — ABNORMAL HIGH (ref 70–99)
Glucose-Capillary: 120 mg/dL — ABNORMAL HIGH (ref 70–99)

## 2014-03-20 LAB — BASIC METABOLIC PANEL
Anion gap: 11 (ref 5–15)
BUN: 12 mg/dL (ref 6–23)
CHLORIDE: 107 meq/L (ref 96–112)
CO2: 26 mEq/L (ref 19–32)
CREATININE: 0.82 mg/dL (ref 0.50–1.10)
Calcium: 9.1 mg/dL (ref 8.4–10.5)
GFR calc non Af Amer: 80 mL/min — ABNORMAL LOW (ref >90)
GLUCOSE: 98 mg/dL (ref 70–99)
POTASSIUM: 4.3 meq/L (ref 3.7–5.3)
Sodium: 144 mEq/L (ref 137–147)

## 2014-03-20 LAB — CBC
HCT: 38.8 % (ref 36.0–46.0)
HEMOGLOBIN: 12.8 g/dL (ref 12.0–15.0)
MCH: 29.6 pg (ref 26.0–34.0)
MCHC: 33 g/dL (ref 30.0–36.0)
MCV: 89.6 fL (ref 78.0–100.0)
Platelets: 236 10*3/uL (ref 150–400)
RBC: 4.33 MIL/uL (ref 3.87–5.11)
RDW: 14.6 % (ref 11.5–15.5)
WBC: 9.4 10*3/uL (ref 4.0–10.5)

## 2014-03-20 LAB — RPR

## 2014-03-20 LAB — C-REACTIVE PROTEIN: CRP: 2.1 mg/dL — AB (ref <0.60)

## 2014-03-20 LAB — HOMOCYSTEINE: Homocysteine: 9.2 umol/L (ref 4.0–15.4)

## 2014-03-20 LAB — TSH: TSH: 3.63 u[IU]/mL (ref 0.350–4.500)

## 2014-03-20 LAB — HIV ANTIBODY (ROUTINE TESTING W REFLEX): HIV 1&2 Ab, 4th Generation: NONREACTIVE

## 2014-03-20 LAB — VITAMIN B12: Vitamin B-12: 409 pg/mL (ref 211–911)

## 2014-03-20 MED ORDER — ASPIRIN 81 MG PO TBEC
81.0000 mg | DELAYED_RELEASE_TABLET | Freq: Every day | ORAL | Status: DC
Start: 2014-03-20 — End: 2014-04-10

## 2014-03-20 MED ORDER — LABETALOL HCL 5 MG/ML IV SOLN
5.0000 mg | Freq: Once | INTRAVENOUS | Status: AC
  Administered 2014-03-20: 5 mg via INTRAVENOUS
  Filled 2014-03-20: qty 4

## 2014-03-20 MED ORDER — DEXLANSOPRAZOLE 60 MG PO CPDR: 60.0000 mg | DELAYED_RELEASE_CAPSULE | Freq: Every evening | ORAL | Status: DC

## 2014-03-20 MED ORDER — FUROSEMIDE 20 MG PO TABS: 20.0000 mg | ORAL_TABLET | Freq: Every day | ORAL | Status: DC | PRN

## 2014-03-20 MED ORDER — TRIAMTERENE-HCTZ 37.5-25 MG PO CAPS: 1.0000 | ORAL_CAPSULE | Freq: Every morning | ORAL | Status: DC

## 2014-03-20 MED ORDER — HYDRALAZINE HCL 100 MG PO TABS
100.0000 mg | ORAL_TABLET | Freq: Two times a day (BID) | ORAL | Status: DC
Start: 2014-03-20 — End: 2014-03-20

## 2014-03-20 MED ORDER — OXYCODONE-ACETAMINOPHEN 5-325 MG PO TABS: 1.0000 | ORAL_TABLET | ORAL | Status: DC | PRN

## 2014-03-20 MED ORDER — ISOSORBIDE MONONITRATE ER 60 MG PO TB24: 60.0000 mg | ORAL_TABLET | Freq: Every evening | ORAL | Status: DC

## 2014-03-20 MED ORDER — ROPINIROLE HCL 3 MG PO TABS: 3.0000 mg | ORAL_TABLET | Freq: Every day | ORAL | Status: DC

## 2014-03-20 MED ORDER — CLONIDINE HCL 0.3 MG/24HR TD PTWK: 0.3000 mg | MEDICATED_PATCH | TRANSDERMAL | Status: DC

## 2014-03-20 MED ORDER — LABETALOL HCL 300 MG PO TABS: 300.0000 mg | ORAL_TABLET | Freq: Two times a day (BID) | ORAL | Status: DC

## 2014-03-20 MED ORDER — ZOLPIDEM TARTRATE 10 MG PO TABS: 10.0000 mg | ORAL_TABLET | Freq: Every day | ORAL | Status: DC

## 2014-03-20 MED ORDER — LEVOTHYROXINE SODIUM 50 MCG PO TABS: 50.0000 ug | ORAL_TABLET | Freq: Every day | ORAL | Status: DC

## 2014-03-20 MED ORDER — GADOBENATE DIMEGLUMINE 529 MG/ML IV SOLN
20.0000 mL | Freq: Once | INTRAVENOUS | Status: AC | PRN
  Administered 2014-03-20: 20 mL via INTRAVENOUS

## 2014-03-20 MED ORDER — GABAPENTIN 600 MG PO TABS
600.0000 mg | ORAL_TABLET | Freq: Three times a day (TID) | ORAL | Status: DC
Start: 2014-03-20 — End: 2015-01-31

## 2014-03-20 NOTE — Progress Notes (Signed)
PT Cancellation Note  Patient Details Name: Katrina [name] MRN: [id number] DOB: 25-Nov-1960   Cancelled Treatment:    Reason Eval/Treat Not Completed: Other (comment) Attempted PT treatment, though pt states she is being discharged today.  Pt questioned regarding any questions or concerns with functional mobility skills, and pt stated she felt ok and did not have any questions.  Pt will continue with OPPT services if needed for PT needs.    Bon Dowis 03/20/2014, 3:06 PM

## 2014-03-20 NOTE — Progress Notes (Signed)
Pt discharged home today per Dr. Caryn Section. Pt's BP and HR within defined limits that Dr. Caryn Section ordered (BP 175/96 HR 70).  Pt's other VSS. Pt's IV sites d/c'd and WDL. Pt provided with home medication list, discharge instructions and prescriptions. Verbalized understanding. Pt ambulated off floor in stable condition accompanied by RN.

## 2014-03-20 NOTE — Discharge Summary (Signed)
Physician Discharge Summary  Katrina [name] KDT:[id number] DOB: August 23, 1960 DOA: 03/18/2014  PCP: No PCP Per Patient [place] date: 03/18/2014 Discharge date: 03/20/2014  Time spent: GREATER THAN 30 minutes  Recommendations for Outpatient Follow-up:  1. Recommend followup of the patient's blood pressure and adjustments of medications as needed.  2. Outpatient physical therapy was ordered upon discharge.  Discharge Diagnoses:   1. Focal left-sided weakness and questionable left facial droop, etiology unknown. MRI was negative for an acute stroke. Questionable embellishment of her symptoms. 2. Left-sided paresthesias. 3. Cerebral microvascular disease. 4. Malignant hypertension. 5. Chest pain in a patient with nonobstructive Coronary artery disease. MI ruled out. 6. Grade 2 diastolic dysfunction, per echo. 7. 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 8. History of peptic ulcer disease. 9. Morbid obesity. 10. Tobacco abuse. The patient was advised to stop smoking. 11. Hypothyroidism.  Discharge Condition: Improved.  Diet recommendation: Heart healthy.  Filed Weights   03/18/14 2058 03/19/14 0125 03/19/14 0500  Weight: 97.07 kg (214 lb) 98.3 kg (216 lb 11.4 oz) 98.3 kg (216 lb 11.4 oz)    History of present illness:  The patient is a 53 year old woman with a history of nonobstructive coronary artery disease, obesity, TIA, gastric ulcer, hypertension and diabetes mellitus. She presented to the emergency department on 03/18/14 with a chief complaint of left-sided numbness and tingling, left-sided weakness, and chest pain. The patient had run out of her medications and had not taken any of her medications for several days. In the ED, she was hypertensive with a blood pressure ranging from 144/100-204/89. She was oxygenating 98% on room air. She was afebrile. CT of her brain revealed no acute findings. CT angiogram of her brain and neck revealed no  acute vascular process. Her chest x-ray revealed no acute cardiopulmonary process. Her lab data revealed a normal troponin I., otherwise unremarkable. She was admitted for further evaluation and management.  Hospital Course:   1. Left-sided weakness with associated paresthesias. The patient passed the swallow evaluation in the ED. She was started on full dose aspirin. She was restarted on her chronic antihypertensive medications. She was instructed to stop smoking. For further evaluation, a number of studies were ordered. MRI of her head revealed no acute infarction, but moderate punctate and patchy nonspecific white matter type changes which the radiologist thought was more consistent with small vessel disease from hypertension rather than a demyelinating process or vasculitis. MRA of her head revealed no significant stenosis. Her 2-D echocardiogram revealed grade 2 diastolic dysfunction, but with an ejection fraction of 60-65%. Carotid ultrasound revealed no ICA stenosis bilaterally. Her fasting lipid profile was unremarkable with exception of her HDL being slightly low at 30, but her LDL cholesterol was 77. Neurologist, Dr. Merlene Laughter was consulted. He was concerned about a demyelinating process. Therefore, he ordered an MRI of the cervical spine and another MRI of the brain. There were no obvious cervical spinal cord lesions to suggest MS plaques, but there was some degenerative changes in the C4-C5 and C5-C6 vertebrae. Followup MRI was of the brain was unremarkable. Therefore, it was less likely that she had MS. Her TSH was within normal limits. RPR was negative. HIV was nonreactive. The patient's symptoms improved. She was evaluated by the physical therapist who believes that her exam was questionable as she had intermittent weakness and a facial droop. The dictating physician felt that there was some embellishment of her symptoms. Nevertheless, she was instructed to continue aspirin therapy  and outpatient  physical therapy was ordered.   2. Malignant hypertension. The patient reported not taking her antihypertensive medications for more than one week. Her previous physician was supposed to call in a prescription for her medications, but apparently it was not done. She is to be chronically with multiple antihypertensive medications. Although hydralazine was ordered, she refuses to take it she reported that it had been discontinued. She was continued on labetalol, Maxzide, and clonidine patch. Also, when necessary hydralazine was ordered. Her blood pressure was not ideal, but did improve prior to hospital discharge. She was encouraged to take her antihypertensive medications as prescribed. Prescriptions were given with one refill. 3. Chest pain with a history of nonobstructive coronary artery disease. The patient's troponin I was negative x2. Her chest pain resolved. Echocardiogram revealed preserved left ventricular systolic function and no left ventricular regional wall motion abnormalities. 4. Tobacco abuse. The patient was advised to stop smoking. Nicotine replacement therapy was given. 5. Hypothyroidism. She was maintained on Synthroid. Her TSH was within normal limits. 6. Questionable history of Diet controlled diabetes mellitus. The patient now denies that she has diabetes. Her blood glucose was controlled. She was treated with sliding scale NovoLog     Procedures: 2-D echocardiogram on 10/13-- Left ventricle: The cavity size was normal. Systolic function was normal. The estimated ejection fraction was in the range of 60% to 65%. Wall motion was normal; there were no regional wall motion abnormalities. Features are consistent with a pseudonormal left ventricular filling pattern, with concomitant abnormal relaxation and increased filling pressure (grade 2 diastolic dysfunction). Doppler parameters are consistent with high ventricular filling pressure. Moderate concentric and severe focal  basal septal hypertrophy. - Mitral valve: There was mild regurgitation. - Tricuspid valve: There was mild regurgitation. - Pulmonary arteries: PA peak pressure: 36 mm Hg (S). Mildly elevated pulmonary pressures.    Consultations:  Neurologist, Dr. Merlene Laughter.  Discharge Exam: Filed Vitals:   03/20/14 1700  BP: 175/96  Pulse: 70  Temp:   Resp:    temperature 98.4. Blood pressure 175/96.  General: Obese 53 year old Caucasian woman sitting up in a chair, in no acute distress. Cardiovascular: S1, S2, with a soft systolic murmur. Respiratory: Clear to auscultation bilaterally. Neurologic/psychiatric: She is alert and oriented x3. No obvious facial droop, dysarthria, or dysphagia. Strength of the upper extremities reveals questionable 4+ to 4 minus over 5 left upper extremity strength and 5 over 5 right upper extremity strength. Query malingering with the stress exam. Gait within normal limits, but it appears that the patient tries to favor her left lower extremity.  Discharge Instructions You were cared for by a hospitalist during your hospital stay. If you have any questions about your discharge medications or the care you received while you were in the hospital after you are discharged, you can call the unit and asked to speak with the hospitalist on call if the hospitalist that took care of you is not available. Once you are discharged, your primary care physician will handle any further medical issues. Please note that NO REFILLS for any discharge medications will be authorized once you are discharged, as it is imperative that you return to your primary care physician (or establish a relationship with a primary care physician if you do not have one) for your aftercare needs so that they can reassess your need for medications and monitor your lab values.  Discharge Instructions   Diet - low sodium heart healthy    Complete by:  As directed  Discharge instructions    Complete by:  As  directed   Take medications as prescribed. Do not drive for 1 week. Keep the follow up appointments scheduled. Stop smoking.     Increase activity slowly    Complete by:  As directed           Discharge Medication List as of 03/20/2014  4:42 PM    START taking these medications   Details  aspirin EC 81 MG EC tablet Take 1 tablet (81 mg total) by mouth daily., Starting 03/20/2014, Until Discontinued, No Print      CONTINUE these medications which have CHANGED   Details  cloNIDine (CATAPRES - DOSED IN MG/24 HR) 0.3 mg/24hr patch Place 1 patch (0.3 mg total) onto the skin once a week. Sundays, Starting 03/20/2014, Until Discontinued, No Print    dexlansoprazole (DEXILANT) 60 MG capsule Take 1 capsule (60 mg total) by mouth every evening., Starting 03/20/2014, Until Discontinued, Print    furosemide (LASIX) 20 MG tablet Take 1 tablet (20 mg total) by mouth daily as needed for fluid., Starting 03/20/2014, Until Discontinued, Print    gabapentin (NEURONTIN) 600 MG tablet Take 1 tablet (600 mg total) by mouth 3 (three) times daily., Starting 03/20/2014, Until Discontinued, Print    isosorbide mononitrate (IMDUR) 60 MG 24 hr tablet Take 1 tablet (60 mg total) by mouth every evening., Starting 03/20/2014, Until Discontinued, Print    labetalol (NORMODYNE) 300 MG tablet Take 1 tablet (300 mg total) by mouth 2 (two) times daily., Starting 03/20/2014, Until Discontinued, Print    levothyroxine (SYNTHROID, LEVOTHROID) 50 MCG tablet Take 1 tablet (50 mcg total) by mouth at bedtime., Starting 03/20/2014, Until Discontinued, Print    oxyCODONE-acetaminophen (PERCOCET/ROXICET) 5-325 MG per tablet Take 1 tablet by mouth every 4 (four) hours as needed for severe pain (for pain)., Starting 03/20/2014, Until Discontinued, Print    rOPINIRole (REQUIP) 3 MG tablet Take 1 tablet (3 mg total) by mouth at bedtime., Starting 03/20/2014, Until Discontinued, Print    triamterene-hydrochlorothiazide (DYAZIDE)  37.5-25 MG per capsule Take 1 each (1 capsule total) by mouth every morning., Starting 03/20/2014, Until Discontinued, Print    zolpidem (AMBIEN) 10 MG tablet Take 1 tablet (10 mg total) by mouth at bedtime., Starting 03/20/2014, Until Discontinued, Print      CONTINUE these medications which have NOT CHANGED   Details  EPINEPHrine (EPI-PEN) 0.3 mg/0.3 mL DEVI Inject 0.3 mg into the muscle once., Until Discontinued, Historical Med      STOP taking these medications     acetaminophen-codeine (TYLENOL #3) 300-30 MG per tablet      acetaminophen-codeine (TYLENOL #3) 300-30 MG per tablet      carisoprodol (SOMA) 350 MG tablet      hydrALAZINE (APRESOLINE) 100 MG tablet        Allergies  Allergen Reactions  . Penicillins Anaphylaxis  . Pineapple Rash  . Strawberry Rash  . Aspirin    Follow-up Information   Follow up with Alphia Kava On 04/09/2014. (at 10:00)    Contact information:   [place] [address] [phone]       Follow up with Phillips Odor, MD On 05/21/2014. (at 2:15 pm)    Specialty:  Neurology   Contact information:   [address] [place] [address] [phone]        The results of significant diagnostics from this hospitalization (including imaging, microbiology, ancillary and laboratory) are listed below for reference.  Significant Diagnostic Studies: Ct Angio Head W/cm &/or Wo Cm  03/18/2014   CLINICAL DATA:  One day history of headache, sharp head pain beginning 1 hr ago. One day history of numbness and weakness LEFT extremities. Past medical history of hypertension and transient ischemic attack.  EXAM: CT ANGIOGRAPHY HEAD AND NECK  TECHNIQUE: Multidetector CT imaging of the head and neck was performed using the standard protocol during bolus administration of intravenous contrast. Multiplanar CT image reconstructions and MIPs were obtained to evaluate the vascular anatomy. Carotid stenosis measurements (when  applicable) are obtained utilizing NASCET criteria, using the distal internal carotid diameter as the denominator.  CONTRAST:  36mL OMNIPAQUE IOHEXOL 350 MG/ML SOLN  COMPARISON:  CT of the head March 18, 2014 at 2129 hr and CT of the head February 27, 2014  FINDINGS: CTA HEAD FINDINGS  Anterior circulation: Normal appearance of the cervical internal carotid arteries, petrous, cavernous and supra clinoid internal carotid arteries. Anterior communicating artery is not distinctly identified. Normal appearance of the anterior and middle cerebral arteries.  Posterior circulation: RIGHT vertebral artery is dominant with normal appearance of the vertebral arteries, vertebrobasilar junction and basilar artery, as well as main branch vessels. Normal appearance of the posterior cerebral arteries. Tiny RIGHT posterior communicating artery present.  No large vessel occlusion, hemodynamically significant stenosis, dissection, luminal irregularity, contrast extravasation or aneurysm within the anterior nor posterior circulation.  Review of the MIP images confirms the above findings.  CTA NECK FINDINGS  Normal appearance of the thoracic arch, normal branch pattern. Mild calcific atherosclerosis The origins of the innominate, left Common carotid artery and subclavian artery are widely patent.  Bilateral Common carotid arteries are widely patent, coursing in a straight line fashion. Eccentric intimal thickening calcific atherosclerosis of the carotid bulbs measuring up to 3 mm on the LEFT without hemodynamically significant stenosis by NASCET criteria. Normal appearance of the included internal carotid arteries.  RIGHT vertebral artery is dominant. Normal appearance of the vertebral arteries, which appear widely patent.  No hemodynamically significant stenosis by NASCET criteria. No dissection, no pseudoaneurysm. No abnormal luminal irregularity. No contrast extravasation.  Soft tissues are unremarkable. No acute osseous process  though bone windows have not been submitted.  Review of the MIP images confirms the above findings.  IMPRESSION: CTA HEAD: No acute vascular process nor hemodynamically significant stenosis.  CTA NECK: No acute vascular process. Atherosclerosis of the carotid bulbs without hemodynamically significant stenosis.   Electronically Signed   By: Elon Alas   On: 03/18/2014 23:31   Dg Chest 2 View  03/19/2014   CLINICAL DATA:  Intermittent chest pain for 1 day. History of smoking, CAD and COPD. Initial encounter.  EXAM: CHEST  2 VIEW  COMPARISON:  09/22/2013; 04/02/2012  FINDINGS: Grossly unchanged cardiac silhouette and mediastinal contours. Evaluation retrosternal clear space obscured secondary to overlying soft tissues. The lungs remain hyperexpanded. Grossly unchanged bibasilar heterogeneous opacities, left greater than right, likely atelectasis. No new focal airspace opacities. No pleural effusion or pneumothorax. No evidence of edema. No acute osseus abnormalities.  IMPRESSION: Hyperexpanded lungs and bibasilar atelectasis without definite acute cardiopulmonary disease.   Electronically Signed   By: Sandi Mariscal M.D.   On: 03/19/2014 00:30   Ct Head (brain) Wo Contrast  03/18/2014   CLINICAL DATA:  Acute onset headache 1 hr ago.  EXAM: CT HEAD WITHOUT CONTRAST  TECHNIQUE: Contiguous axial images were obtained from the base of the skull through the vertex without intravenous contrast.  COMPARISON:  Head CT  scan 02/27/2014.  FINDINGS: There is no evidence of acute intracranial abnormality including infarct, hemorrhage, mass lesion, mass effect, midline shift or abnormal extra-axial fluid collection. No hydrocephalus or pneumocephalus. The calvarium is intact.  IMPRESSION: Negative head CT.  Critical Value/emergent results were called by telephone at the time of interpretation on 03/18/2014 at 9:33 pm to Dr. Nat Christen , who verbally acknowledged these results.   Electronically Signed   By: Inge Rise M.D.   On: 03/18/2014 21:33   Ct Angio Neck W/cm &/or Wo/cm  03/18/2014   CLINICAL DATA:  One day history of headache, sharp head pain beginning 1 hr ago. One day history of numbness and weakness LEFT extremities. Past medical history of hypertension and transient ischemic attack.  EXAM: CT ANGIOGRAPHY HEAD AND NECK  TECHNIQUE: Multidetector CT imaging of the head and neck was performed using the standard protocol during bolus administration of intravenous contrast. Multiplanar CT image reconstructions and MIPs were obtained to evaluate the vascular anatomy. Carotid stenosis measurements (when applicable) are obtained utilizing NASCET criteria, using the distal internal carotid diameter as the denominator.  CONTRAST:  35mL OMNIPAQUE IOHEXOL 350 MG/ML SOLN  COMPARISON:  CT of the head March 18, 2014 at 2129 hr and CT of the head February 27, 2014  FINDINGS: CTA HEAD FINDINGS  Anterior circulation: Normal appearance of the cervical internal carotid arteries, petrous, cavernous and supra clinoid internal carotid arteries. Anterior communicating artery is not distinctly identified. Normal appearance of the anterior and middle cerebral arteries.  Posterior circulation: RIGHT vertebral artery is dominant with normal appearance of the vertebral arteries, vertebrobasilar junction and basilar artery, as well as main branch vessels. Normal appearance of the posterior cerebral arteries. Tiny RIGHT posterior communicating artery present.  No large vessel occlusion, hemodynamically significant stenosis, dissection, luminal irregularity, contrast extravasation or aneurysm within the anterior nor posterior circulation.  Review of the MIP images confirms the above findings.  CTA NECK FINDINGS  Normal appearance of the thoracic arch, normal branch pattern. Mild calcific atherosclerosis The origins of the innominate, left Common carotid artery and subclavian artery are widely patent.  Bilateral Common carotid arteries  are widely patent, coursing in a straight line fashion. Eccentric intimal thickening calcific atherosclerosis of the carotid bulbs measuring up to 3 mm on the LEFT without hemodynamically significant stenosis by NASCET criteria. Normal appearance of the included internal carotid arteries.  RIGHT vertebral artery is dominant. Normal appearance of the vertebral arteries, which appear widely patent.  No hemodynamically significant stenosis by NASCET criteria. No dissection, no pseudoaneurysm. No abnormal luminal irregularity. No contrast extravasation.  Soft tissues are unremarkable. No acute osseous process though bone windows have not been submitted.  Review of the MIP images confirms the above findings.  IMPRESSION: CTA HEAD: No acute vascular process nor hemodynamically significant stenosis.  CTA NECK: No acute vascular process. Atherosclerosis of the carotid bulbs without hemodynamically significant stenosis.   Electronically Signed   By: Elon Alas   On: 03/18/2014 23:31   Mr Jodene Nam Head Wo Contrast  03/19/2014   CLINICAL DATA:  53 year old hypertensive diabetic female with hyperlipidemia presenting with headache and numbness/ weakness left extremity. Subsequent encounter.  EXAM: MRI HEAD WITHOUT CONTRAST  MRA HEAD WITHOUT CONTRAST  TECHNIQUE: Multiplanar, multiecho pulse sequences of the brain and surrounding structures were obtained without intravenous contrast. Angiographic images of the head were obtained using MRA technique without contrast.  COMPARISON:  CT angiogram head and neck 03/18/2014.  FINDINGS: MRI HEAD FINDINGS  No acute  infarct.  No intracranial hemorrhage. The questionable increased signal within sulci in the occipital region on series 6 is not confirmed as a true abnormality on other sequences and therefore may be related to motion artifact rather than subarachnoid hemorrhage or meningitis.  Moderate punctate and patchy nonspecific white matter type changes. Given the patient's history  of smoking, diabetes and high blood pressure, findings are most likely related to result of small vessel disease rather than demyelinating process, vasculitis or inflammatory process.  No hydrocephalus.  No intracranial mass lesion noted on this unenhanced exam.  Cervical medullary junction, pituitary region, pineal region and orbital structures unremarkable.  Major intracranial vascular structures are patent.  Minimal polypoid opacification medial aspect left maxillary sinus. Opacification posterior right ethmoid sinus air cell.  Tiny right-sided Thornwaldt cyst.  MRA HEAD FINDINGS  Artifact extends through the petrous, cavernous and pre cavernous segment of the internal carotid artery bilaterally more notable on left. No significant stenosis is seen in this region on the recent CT angiogram.  Otherwise anterior circulation without medium or large size vessel significant stenosis or occlusion.  Middle cerebral artery branch vessel irregularity bilaterally.  Right vertebral artery is dominant. Mild ectasia distal right vertebral artery. Mild narrowing distal left vertebral artery.  No significant stenosis of the basilar artery.  Nonvisualized right anterior inferior cerebellar artery.  Mild narrowing and irregularity of the superior cerebellar artery bilaterally.  Posterior cerebral artery distal branch vessel irregularity bilaterally.  No aneurysm detected.  IMPRESSION: MRI HEAD:  No acute infarct.  Moderate punctate and patchy nonspecific white matter type changes. Given the patient's history of smoking, diabetes and high blood pressure, findings are most likely related to result of small vessel disease rather than demyelinating process, vasculitis or inflammatory process.  No intracranial hemorrhage. The questionable increased signal within sulci in the occipital region on series 6 is not confirmed as a true abnormality on other sequences and therefore may be related to motion artifact rather than subarachnoid  hemorrhage or meningitis.  MRA HEAD:  Motion artifact as noted above.  Mild branch vessel irregularity.   Electronically Signed   By: Chauncey Cruel M.D.   On: 03/19/2014 13:32   Mr Brain Wo Contrast  03/19/2014   CLINICAL DATA:  53 year old hypertensive diabetic female with hyperlipidemia presenting with headache and numbness/ weakness left extremity. Subsequent encounter.  EXAM: MRI HEAD WITHOUT CONTRAST  MRA HEAD WITHOUT CONTRAST  TECHNIQUE: Multiplanar, multiecho pulse sequences of the brain and surrounding structures were obtained without intravenous contrast. Angiographic images of the head were obtained using MRA technique without contrast.  COMPARISON:  CT angiogram head and neck 03/18/2014.  FINDINGS: MRI HEAD FINDINGS  No acute infarct.  No intracranial hemorrhage. The questionable increased signal within sulci in the occipital region on series 6 is not confirmed as a true abnormality on other sequences and therefore may be related to motion artifact rather than subarachnoid hemorrhage or meningitis.  Moderate punctate and patchy nonspecific white matter type changes. Given the patient's history of smoking, diabetes and high blood pressure, findings are most likely related to result of small vessel disease rather than demyelinating process, vasculitis or inflammatory process.  No hydrocephalus.  No intracranial mass lesion noted on this unenhanced exam.  Cervical medullary junction, pituitary region, pineal region and orbital structures unremarkable.  Major intracranial vascular structures are patent.  Minimal polypoid opacification medial aspect left maxillary sinus. Opacification posterior right ethmoid sinus air cell.  Tiny right-sided Thornwaldt cyst.  MRA HEAD FINDINGS  Artifact  extends through the petrous, cavernous and pre cavernous segment of the internal carotid artery bilaterally more notable on left. No significant stenosis is seen in this region on the recent CT angiogram.  Otherwise anterior  circulation without medium or large size vessel significant stenosis or occlusion.  Middle cerebral artery branch vessel irregularity bilaterally.  Right vertebral artery is dominant. Mild ectasia distal right vertebral artery. Mild narrowing distal left vertebral artery.  No significant stenosis of the basilar artery.  Nonvisualized right anterior inferior cerebellar artery.  Mild narrowing and irregularity of the superior cerebellar artery bilaterally.  Posterior cerebral artery distal branch vessel irregularity bilaterally.  No aneurysm detected.  IMPRESSION: MRI HEAD:  No acute infarct.  Moderate punctate and patchy nonspecific white matter type changes. Given the patient's history of smoking, diabetes and high blood pressure, findings are most likely related to result of small vessel disease rather than demyelinating process, vasculitis or inflammatory process.  No intracranial hemorrhage. The questionable increased signal within sulci in the occipital region on series 6 is not confirmed as a true abnormality on other sequences and therefore may be related to motion artifact rather than subarachnoid hemorrhage or meningitis.  MRA HEAD:  Motion artifact as noted above.  Mild branch vessel irregularity.   Electronically Signed   By: Chauncey Cruel M.D.   On: 03/19/2014 13:32   Mr Jeri Cos Contrast  03/20/2014   CLINICAL DATA:  New acute onset of LEFT-sided facial drooping, LEFT-sided weakness and numbness. Evaluate for demyelinating lesion.  EXAM: MRI HEAD WITH CONTRAST  TECHNIQUE: Multiplanar, multiecho pulse sequences of the brain and surrounding structures were obtained with intravenous contrast.  COMPARISON:  None.  CONTRAST:  89mL MULTIHANCE GADOBENATE DIMEGLUMINE 529 MG/ML IV SOLN  FINDINGS: Post infusion T1 weighted images in the axial, coronal, and sagittal plane demonstrate no abnormal postcontrast enhancement. Major dural venous sinuses are patent.  IMPRESSION: Nonspecific white matter type changes as  described on the previous noncontrast MR brain exam are favored to represent small vessel disease. No postcontrast enhancement of the brain or meninges to suggest an acute process or demyelinating lesions.   Electronically Signed   By: Rolla Flatten M.D.   On: 03/20/2014 09:17   Mr Cervical Spine Wo Contrast  03/20/2014   CLINICAL DATA:  Acute onset of left-sided facial droop and left-sided weakness and numbness. Brain MRI showed possible MS. evaluate cervical spinal cord.  EXAM: MRI CERVICAL SPINE WITHOUT CONTRAST  TECHNIQUE: Multiplanar, multisequence MR imaging of the cervical spine was performed. No intravenous contrast was administered.  COMPARISON:  None.  FINDINGS: Examination is quite limited due to patient motion.  Normal overall alignment of the cervical vertebral bodies. They demonstrate normal marrow signal. The cervical spinal cord is grossly normal but examination is limited by motion artifact. I do not see any obvious MS plaques. No syrinx.  C2-3:  No significant findings.  C3-4:  No significant findings.  C4-5: Disc osteophyte complex on the right with right foraminal narrowing and encroachment on the right C5 nerve root. No spinal stenosis or left foraminal stenosis.  C5-6: Right foraminal disc osteophyte complex with right foraminal stenosis likely effecting the right C6 nerve root. There is mild flattening of the ventral thecal sac and narrowing of the ventral CSF space but no significant spinal stenosis. No significant left foraminal stenosis.  C6-7: Shallow disc osteophyte complexes bilaterally with mild bilateral foraminal stenosis, left slightly greater than right. No spinal stenosis.  C7-T1:  No significant findings.  IMPRESSION: 1.  Very limited examination due to patient motion. No obvious cervical spinal cord lesions to suggest MS plaques. 2. Right-sided disc osteophyte complexes at C4-5 and C5-6 with right foraminal encroachment on the C5 and C6 nerve roots as above. 3. Bilateral  shallow disc osteophyte complexes at C6-7 with bilateral foraminal encroachment.   Electronically Signed   By: Kalman Jewels M.D.   On: 03/20/2014 09:16   US Carotid Bilateral  03/19/2014   CLINICAL DATA:  Cerebral infarction and syncope. History of hypertension, hyperlipidemia, diabetes and tobacco use.  EXAM: BILATERAL CAROTID DUPLEX ULTRASOUND  TECHNIQUE: Pearline Cables scale imaging, color Doppler and duplex ultrasound were performed of bilateral carotid and vertebral arteries in the neck.  COMPARISON:  CTA of the neck on 03/18/2014  FINDINGS: Criteria: Quantification of carotid stenosis is based on velocity parameters that correlate the residual internal carotid diameter with NASCET-based stenosis levels, using the diameter of the distal internal carotid lumen as the denominator for stenosis measurement.  The following velocity measurements were obtained:  RIGHT  ICA:  103/39 cm/sec  CCA:  361/44 cm/sec  SYSTOLIC ICA/CCA RATIO:  0.9  DIASTOLIC ICA/CCA RATIO:  2.0  ECA:  101 cm/sec  LEFT  ICA:  136/50 mid and 86/31 proximal cm/sec  CCA:  31/54 cm/sec  SYSTOLIC ICA/CCA RATIO:  1.4  DIASTOLIC ICA/CCA RATIO:  2.0  ECA:  108 cm/sec  RIGHT CAROTID ARTERY: The common carotid artery shows intimal thickening. There is a mild amount of partially calcified plaque at the level of the carotid bulb and proximal ICA. Velocities and waveforms are unremarkable and estimated right ICA stenosis is less than 50%.  RIGHT VERTEBRAL ARTERY: Antegrade flow with normal waveform and velocity.  LEFT CAROTID ARTERY: The common carotid artery shows intimal thickening. There is a mild amount of partially calcified plaque at the level of the distal bulb and proximal ICA. This is not causing significant stenosis with estimated left ICA stenosis of less than 50%. Mildly elevated velocity in the mid neck corresponds to a tortuous segment of the internal carotid artery which does not contain plaque.  LEFT VERTEBRAL ARTERY: Antegrade flow with normal  waveform and velocity.  IMPRESSION: Mild amount of plaque at the level of both carotid bulbs and ICA origins. No significant carotid stenosis identified with estimated ICA stenoses of less than 50%. This correlates with CTA findings.   Electronically Signed   By: Aletta Edouard M.D.   On: 03/19/2014 17:33   Mm Digital Diagnostic Bilat  03/12/2014   CLINICAL DATA:  Patient had a diagnostic study of the left breast performed on 02/18/2012. Short-term interval followup of a probable benign intramammary lymph node was recommended.  EXAM: DIGITAL DIAGNOSTIC  BILATERAL MAMMOGRAM WITH CAD  COMPARISON:  Diagnostic mammogram of the left breast dated 02/18/2012.  ACR Breast Density Category b: There are scattered areas of fibroglandular density.  FINDINGS: There is a stable ovoid 8 mm nodule in the 12 o'clock region of the right breast. It has some central fat likely represents an intramammary lymph node. No suspicious mass or malignant type microcalcifications identified in either breast.  Mammographic images were processed with CAD.  IMPRESSION: No evidence of malignancy in either breast.  RECOMMENDATION: Bilateral screening mammogram in 1 year is recommended.  I have discussed the findings and recommendations with the patient. Results were also provided in writing at the conclusion of the visit. If applicable, a reminder letter will be sent to the patient regarding the next appointment.  BI-RADS CATEGORY  2: Benign  Finding(s)   Electronically Signed   By: Lillia Mountain M.D.   On: 03/12/2014 14:06    Microbiology: Recent Results (from the past 240 hour(s))  MRSA PCR SCREENING     Status: None   Collection Time    03/19/14  1:30 AM      Result Value Ref Range Status   MRSA by PCR NEGATIVE  NEGATIVE Final   Comment:            The GeneXpert MRSA Assay (FDA     approved for NASAL specimens     only), is one component of a     comprehensive MRSA colonization     surveillance program. It is not     intended to  diagnose MRSA     infection nor to guide or     monitor treatment for     MRSA infections.     Labs: Basic Metabolic Panel:  Recent Labs Lab 03/18/14 2155 03/20/14 0619  NA 140 144  K 3.5* 4.3  CL 106 107  CO2 22 26  GLUCOSE 120* 98  BUN 16 12  CREATININE 0.81 0.82  CALCIUM 8.8 9.1   Liver Function Tests: No results found for this basename: AST, ALT, ALKPHOS, BILITOT, PROT, ALBUMIN,  in the last 168 hours No results found for this basename: LIPASE, AMYLASE,  in the last 168 hours No results found for this basename: AMMONIA,  in the last 168 hours CBC:  Recent Labs Lab 03/18/14 2155 03/20/14 0619  WBC 8.5 9.4  NEUTROABS 3.2  --   HGB 13.4 12.8  HCT 39.5 38.8  MCV 88.4 89.6  PLT 265 236   Cardiac Enzymes:  Recent Labs Lab 03/18/14 2155 03/19/14 0449 03/19/14 0958 03/19/14 1545  TROPONINI <0.30 <0.30 <0.30 <0.30   BNP: BNP (last 3 results)  Recent Labs  09/22/13 2200  PROBNP 259.2*   CBG:  Recent Labs Lab 03/19/14 2000 03/20/14 0029 03/20/14 0422 03/20/14 0718 03/20/14 1136  GLUCAP 135* 117* 120* 97 120*       Signed:  Merlen Gurry  [facility] 03/20/2014, 6:57 PM

## 2014-03-21 LAB — ANTI-NUCLEAR AB-TITER (ANA TITER): ANA TITER 1: NEGATIVE (ref <1:40)

## 2014-03-21 LAB — T4, FREE: Free T4: 0.89 ng/dL (ref 0.80–1.80)

## 2014-03-21 LAB — ANA: Anti Nuclear Antibody(ANA): POSITIVE — AB

## 2014-04-05 ENCOUNTER — Ambulatory Visit (HOSPITAL_COMMUNITY)
Admission: RE | Admit: 2014-04-05 | Discharge: 2014-04-05 | Disposition: A | Discharge status: Home / Self Care | Payer: Medicare Other | Source: Ambulatory Visit | Attending: Internal Medicine | Admitting: Internal Medicine

## 2014-04-05 DIAGNOSIS — I69353 Hemiplegia and hemiparesis following cerebral infarction affecting right non-dominant side: Secondary | ICD-10-CM | POA: Insufficient documentation

## 2014-04-05 DIAGNOSIS — E119 Type 2 diabetes mellitus without complications: Secondary | ICD-10-CM | POA: Diagnosis not present

## 2014-04-05 DIAGNOSIS — I10 Essential (primary) hypertension: Secondary | ICD-10-CM | POA: Insufficient documentation

## 2014-04-05 DIAGNOSIS — M25552 Pain in left hip: Secondary | ICD-10-CM | POA: Diagnosis not present

## 2014-04-05 DIAGNOSIS — M25652 Stiffness of left hip, not elsewhere classified: Secondary | ICD-10-CM

## 2014-04-05 DIAGNOSIS — M6281 Muscle weakness (generalized): Secondary | ICD-10-CM | POA: Insufficient documentation

## 2014-04-05 DIAGNOSIS — Z5189 Encounter for other specified aftercare: Secondary | ICD-10-CM | POA: Diagnosis not present

## 2014-04-05 NOTE — Evaluation (Signed)
Physical Therapy Evaluation  Patient Details  Name: Katrina [name] MRN: [id number] Date of Birth: 1960-10-11  Today's Date: 04/05/2014 Time: 2993-7169 PT Time Calculation (min): 41 min    Charges: 1 Evaluation, Neuromuscular ReEd [phone]          Visit#: 1 of 16  Re-eval: 05/05/14 Assessment Diagnosis: Lt LE weakness and difficulty walking secondary to Rt sided CVA Next MD Visit: Shane Crutch  Prior Therapy: No  Authorization: Medicare/Medicaid    Authorization Time Period:    Authorization Visit#: 1 of 16   Past Medical History:  Past Medical History  Diagnosis Date  . Diabetes mellitus   . Hypertension   . Hyperlipidemia   . COPD (chronic obstructive pulmonary disease)   . Reflux   . Myocardial infarction   . CAD (coronary artery disease)   . Hypothyroid   . Lung nodules   . Diverticulosis   . Bipolar affective   . Normal cardiac stress test 09/2013  . Diastolic dysfunction 17/51/0258    GRADE 2  . Left-sided weakness 03/20/2014    MRI brain negative for stroke.   Past Surgical History:  Past Surgical History  Procedure Laterality Date  . Back surgery    . Abdominal hysterectomy    . Appendectomy    . Tonsillectomy    . Ankle surgery    . Cholecystectomy    . Nose surgery      Subjective Symptoms/Limitations Symptoms: Left sided weakness. and pain from Lt hip into Lt foot. Pertinent History: Patient had CVA 2 to 3 weeks ago. history of eart attack in 2000, prior history of diabetes, no longer insulin dependent, managed with diet and exercise.history of chronic back and hip pain. Lumbar spine fusion, unsure of what level, likely L3-L5, that was injured on ice. Patient has had cain for ambulation since  February 2015.  How long can you walk comfortably?: unable to walk comfortabley at all uses [facility] for gait  Pain Assessment Currently in Pain?: Yes Pain Score: 6  Pain Location: Hip Pain Orientation: Left Pain Type: Chronic pain Pain Radiating Towards:  Lt hip into lower leg and foot.  Pain Onset: 1 to 4 weeks ago Pain Frequency: Constant Pain Relieving Factors: rest, laying down.   Precautions/Restrictions     Balance Screening Balance Screen Has the patient fallen in the past 6 months: No Has the patient had a decrease in activity level because of a fear of falling? : No Is the patient reluctant to leave their home because of a fear of falling? : No  Prior Functioning     Cognition/Observation    Sensation/Coordination/Flexibility/Functional Tests Flexibility Thomas: Positive Obers: Positive 90/90: Negative Functional Tests Functional Tests: Gait analysis: excessive trendelenberg gait secondary to weakness in Lt glut med/max Functional Tests: 5x sit to stand: 32.92 seconds Rt knee pain during performance.  Functional Tests: 79minute walk test: unable to perform due to fatigue  Assessment RLE Strength Right Hip Flexion: 5/5 Right Knee Flexion: 5/5 Right Knee Extension: 5/5 Right Ankle Dorsiflexion: 5/5 LLE AROM (degrees) LLE Overall AROM Comments: positive Ely's test Left Hip External Rotation : 45 Left Hip Internal Rotation : 20 Left Ankle Dorsiflexion: 0 LLE Strength Left Hip Flexion: 2+/5 Left Hip Extension: 2+/5 Left Hip ABduction: 2+/5 Left Knee Flexion: 3+/5 Left Knee Extension: 3+/5 Left Ankle Dorsiflexion: 3+/5  Exercise/Treatments Mobility/Balance  Berg Balance Test Sit to Stand: Able to stand  independently using hands Standing Unsupported: Able to stand 30 seconds unsupported Sitting with Back Unsupported  but Feet Supported on Floor or Stool: Able to sit safely and securely 2 minutes Stand to Sit: Controls descent by using hands Transfers: Able to transfer safely, definite need of hands Standing Unsupported with Eyes Closed: Able to stand 3 seconds Standing Ubsupported with Feet Together: Able to place feet together independently but unable to hold for 30 seconds From Standing, Reach Forward with  Outstretched Arm: Can reach forward >12 cm safely (5") From Standing Position, Pick up Object from Floor: Unable to try/needs assist to keep balance From Standing Position, Turn to Look Behind Over each Shoulder: Looks behind one side only/other side shows less weight shift Turn 360 Degrees: Able to turn 360 degrees safely but slowly Standing Unsupported, Alternately Place Feet on Step/Stool: Able to complete >2 steps/needs minimal assist Standing Unsupported, One Foot in Front: Needs help to step but can hold 15 seconds Standing on One Leg: Tries to lift leg/unable to hold 3 seconds but remains standing independently Total Score: 30   Heel/toe raises 10x gastroc stretch 3x20 stretches  Physical Therapy Assessment and Plan PT Assessment and Plan Clinical Impression Statement: Patient displasy Lt LE weakness, difficulty walking and pain in Lt LEfollowing Rt sided CVA. Patient demsontrates decreased balance and increased risk of falls as indicated by 5x sit to stand performance and Berg Balance score.  Patient will benefit from skilled phsyical therapy to return to walking and performing all ADL's and IADL's without UE assistance.  Pt will benefit from skilled therapeutic intervention in order to improve on the following deficits: Abnormal gait;Decreased activity tolerance;Decreased balance;Decreased strength;Difficulty walking;Pain;Improper body mechanics;Impaired flexibility;Decreased range of motion Rehab Potential: Good Clinical Impairments Affecting Rehab Potential: continuing to experience TIA's  PT Frequency: Min 2X/week PT Duration: 8 weeks PT Treatment/Interventions: Gait training;Therapeutic exercise;Functional mobility training;Manual techniques;Modalities;Patient/family education;Stair training;Therapeutic activities;Balance training PT Plan: Focus of therapy on increasing LE strength and balance    Goals Home Exercise Program Pt/caregiver will Perform Home Exercise Program: For  increased ROM;For increased strengthening PT Goal: Perform Home Exercise Program - Progress: Goal set today PT Short Term Goals Time to Complete Short Term Goals: 4 weeks PT Short Term Goal 1: Patient will display a negative thomas nd negative Ober's test to improve stride length of gait PT Short Term Goal 2: Patient will isplays increased ankle dorsiflexion strength of >10 degrees to decrease early heel rise durign gait.  PT Short Term Goal 3: Patient ill demosntrate increased Lt hip abduction of 3+/5 to decrease severity of trendelenberg gait PT Short Term Goal 4: Patient will demonstrate Lt 3/5 MMT strength of hip flexors to be able to lift Lt leg while performing stairs PT Short Term Goal 5: Patient will demosntrate increased glut strength of 3+/5 to be able to perform sit to stand without UE assist PT Long Term Goals Time to Complete Long Term Goals: 8 weeks PT Long Term Goal 1: Patient ill demosntrate increased Lt hip abduction of 4/5 to decrease severity of trendelenberg gait PT Long Term Goal 2: Patient will demosntrate increased glut max strength of 4/5 to be able to perform 5x sit to stand in <15 seconds Long Term Goal 3: Patient will be able to ambulate >1226ft without UE assist during performance of 71min walk test.  Long Term Goal 4: Patient will dmeosntrate improved Berg balance score of >52/56 to indicate improved balance and patient not at high risk of falls PT Long Term Goal 5: patient will be able to ambualte up and down a flight of stairs without UE  assist  Problem List Patient Active Problem List   Diagnosis Date Noted  . Muscle weakness (generalized) 04/05/2014  . Stiffness of left hip joint 04/05/2014  . Pain in left hip 04/05/2014  . Diastolic dysfunction 02/27/3006  . CAD (coronary artery disease) 03/20/2014  . PUD (peptic ulcer disease) 03/20/2014  . Type 2 diabetes, uncontrolled, with neuropathy 03/20/2014  . Morbid obesity 03/20/2014  . Left-sided weakness  03/20/2014  . Paresthesias 03/20/2014  . Cerebrovascular disease 03/20/2014  . Dyspnea 03/05/2012  . Chest pain 03/05/2012  . Tobacco abuse 03/05/2012  . COPD (chronic obstructive pulmonary disease) 11/23/2011  . GERD (gastroesophageal reflux disease) 11/23/2011  . HTN (hypertension) 11/23/2011    PT - End of Session Activity Tolerance: Patient tolerated treatment well General Behavior During Therapy: WFL for tasks assessed/performed PT Plan of Care PT Home Exercise Plan: calf and heel raises, and gastroc stretching.   GP Functional Assessment Tool Used: FOTO 56% limited Functional Limitation: Mobility: Walking and moving around Mobility: Walking and Moving Around Current Status ([id number]): At least 40 percent but less than 60 percent impaired, limited or restricted Mobility: Walking and Moving Around Goal Status [phone]): At least 20 percent but less than 40 percent impaired, limited or restricted  Tyon Cerasoli R 04/05/2014, 7:06 PM  Physician Documentation Your signature is required to indicate approval of the treatment plan as stated above.  Please sign and either send electronically or make a copy of this report for your files and return this physician signed original.   Please mark one 1.__approve of plan  2. ___approve of plan with the following conditions.   ______________________________                                                          _____________________ Physician Signature                                                                                                             Date

## 2014-04-08 ENCOUNTER — Ambulatory Visit (HOSPITAL_COMMUNITY)
Admission: RE | Admit: 2014-04-08 | Payer: Medicare Other | Source: Ambulatory Visit | Attending: Internal Medicine | Admitting: Internal Medicine

## 2014-04-09 DIAGNOSIS — R350 Frequency of micturition: Secondary | ICD-10-CM | POA: Diagnosis not present

## 2014-04-09 DIAGNOSIS — R319 Hematuria, unspecified: Secondary | ICD-10-CM | POA: Diagnosis not present

## 2014-04-10 ENCOUNTER — Ambulatory Visit (HOSPITAL_COMMUNITY): Payer: Medicare Other | Attending: Internal Medicine | Admitting: Physical Therapy

## 2014-04-10 ENCOUNTER — Ambulatory Visit (INDEPENDENT_AMBULATORY_CARE_PROVIDER_SITE_OTHER): Payer: Medicare Other | Admitting: Cardiovascular Disease

## 2014-04-10 ENCOUNTER — Encounter: Payer: Self-pay | Admitting: Cardiovascular Disease

## 2014-04-10 VITALS — BP 182/112 | HR 80 | Ht 67.0 in | Wt 213.0 lb

## 2014-04-10 DIAGNOSIS — I209 Angina pectoris, unspecified: Secondary | ICD-10-CM

## 2014-04-10 DIAGNOSIS — E785 Hyperlipidemia, unspecified: Secondary | ICD-10-CM | POA: Diagnosis not present

## 2014-04-10 DIAGNOSIS — R0609 Other forms of dyspnea: Secondary | ICD-10-CM

## 2014-04-10 DIAGNOSIS — I5032 Chronic diastolic (congestive) heart failure: Secondary | ICD-10-CM | POA: Diagnosis not present

## 2014-04-10 DIAGNOSIS — E119 Type 2 diabetes mellitus without complications: Secondary | ICD-10-CM | POA: Insufficient documentation

## 2014-04-10 DIAGNOSIS — M25552 Pain in left hip: Secondary | ICD-10-CM | POA: Insufficient documentation

## 2014-04-10 DIAGNOSIS — R0600 Dyspnea, unspecified: Secondary | ICD-10-CM

## 2014-04-10 DIAGNOSIS — I639 Cerebral infarction, unspecified: Secondary | ICD-10-CM

## 2014-04-10 DIAGNOSIS — Z9289 Personal history of other medical treatment: Secondary | ICD-10-CM

## 2014-04-10 DIAGNOSIS — E038 Other specified hypothyroidism: Secondary | ICD-10-CM

## 2014-04-10 DIAGNOSIS — I119 Hypertensive heart disease without heart failure: Secondary | ICD-10-CM

## 2014-04-10 DIAGNOSIS — Z87898 Personal history of other specified conditions: Secondary | ICD-10-CM

## 2014-04-10 DIAGNOSIS — I25118 Atherosclerotic heart disease of native coronary artery with other forms of angina pectoris: Secondary | ICD-10-CM | POA: Diagnosis not present

## 2014-04-10 DIAGNOSIS — I10 Essential (primary) hypertension: Secondary | ICD-10-CM | POA: Insufficient documentation

## 2014-04-10 DIAGNOSIS — Z5189 Encounter for other specified aftercare: Secondary | ICD-10-CM | POA: Insufficient documentation

## 2014-04-10 DIAGNOSIS — I69353 Hemiplegia and hemiparesis following cerebral infarction affecting right non-dominant side: Secondary | ICD-10-CM | POA: Insufficient documentation

## 2014-04-10 MED ORDER — SIMVASTATIN 20 MG PO TABS: 20.0000 mg | ORAL_TABLET | Freq: Every day | ORAL | Status: DC

## 2014-04-10 MED ORDER — AZILSARTAN MEDOXOMIL 80 MG PO TABS: 80.0000 mg | ORAL_TABLET | Freq: Every day | ORAL | Status: DC

## 2014-04-10 NOTE — Patient Instructions (Signed)
Your physician recommends that you schedule a follow-up appointment in: 3-4 weeks with Dr. Bronson Ing  Your physician has recommended you make the following change in your medication:   START SIMVASTATIN 20 MG DAILY  START EDARBI 80 MG DAILY WE HAVE GIVEN YOU SAMPLES FOR 1 MONTH SUPPLY  Your physician recommends that you return for lab work in: [place]  Thank you for choosing [facility]!!

## 2014-04-10 NOTE — Progress Notes (Signed)
Patient ID: Katrina [name], female   DOB: 1960-12-26, 53 y.o.   MRN: [id number]       CARDIOLOGY CONSULT NOTE  Patient ID: Katrina [name] MRN: [id number] DOB/AGE: September 27, 1960 53 y.o.  Admit date: (Not on file) Primary Physician No PCP Per Patient  Reason for Consultation: CAD, diastolic dysfunction, chest pain  HPI: The patient is a 53 year old woman with a history of non-obstructive coronary artery disease, malignant hypertension, tobacco abuse, obesity, hypothyroidism,and diet controlled diabetes mellitus. She was hospitalized in October for left-sided weakness with associated paresthesias of uncertain etiology. She had neurologic imaging which did not reveal any evidence of acute infarction with small vessel ischemic disease seen. An echocardiogram performed during the hospitalization revealed normal left ventricular systolic function, LVEF 18-84%, and grade 2 diastolic dysfunction with LVH, along with mild mitral and tricuspid regurgitation. Carotid Dopplers revealed no significant stenosis bilaterally. Lipid panel revealed LDL of 77. Prior to hospitalization, she had been out of her antihypertensive medications for more than one week. She had chest pain and ruled out for an acute coronary syndrome. ECG on 03/18/2014 demonstrated normal sinus rhythm with no ischemic ST segment or T-wave abdomen allergies noted. She was evaluated by Dr. Mare [name] in April 2015 and underwent nuclear stress testing on 09/23/2013 which did not demonstrate any evidence of myocardial ischemia or scar with normal regional wall motion, calculated LVEF 71%. She reportedly has a history of "very mild" myocardial infarction sustained in [place] in 2000. She most recently underwent cardiac catheterization in [place] and either 2012 are 2013 by Dr. Domenica [name] at [facility] in [place], [place]. She said she has 3 blockages, one of which is 70% and the other 2 of which are 40%. She was reportedly told that at some  point she may need a pacemaker. She said her blood pressure was 250/160 back then. She lived in [place] from 1998 2007. She now lives with her oldest daughter, Katrina [name], in [place], [place]. She has been on antihypertensive medication since the 1980s. Her father had an MI at the age of 80.  She has dyspnea and chest tightness with exertion with as little as 15 feet of walking. She said, however, that her blood pressure has been in the 166-063 systolic range for the past 2 weeks.  She does not take aspirin as she had previously developed a GI ulcer after taking NSAIDs chronically for lower back pain which ultimately required a spinal fusion. She does not have a true allergy to aspirin.  She sometimes has ankle and feet swelling but not today.  GFR 80 ml/min on 03/20/14.    Allergies  Allergen Reactions  . Penicillins Anaphylaxis  . Pineapple Rash  . Strawberry Rash  . Aspirin     Current Outpatient Prescriptions  Medication Sig Dispense Refill  . acetaminophen-codeine (TYLENOL #3) 300-30 MG per tablet Take 1 tablet by mouth every 8 (eight) hours as needed.   0  . cloNIDine (CATAPRES - DOSED IN MG/24 HR) 0.3 mg/24hr patch Place 1 patch (0.3 mg total) onto the skin once a week. Sundays 4 patch 1  . dexlansoprazole (DEXILANT) 60 MG capsule Take 1 capsule (60 mg total) by mouth every evening. 30 capsule 1  . EPINEPHrine (EPI-PEN) 0.3 mg/0.3 mL DEVI Inject 0.3 mg into the muscle once.    . furosemide (LASIX) 20 MG tablet Take 1 tablet (20 mg total) by mouth daily as needed for fluid. 30 tablet 0  . gabapentin (NEURONTIN) 600 MG tablet Take  1 tablet (600 mg total) by mouth 3 (three) times daily. 90 tablet 1  . isosorbide mononitrate (IMDUR) 60 MG 24 hr tablet Take 1 tablet (60 mg total) by mouth every evening. 30 tablet 1  . labetalol (NORMODYNE) 300 MG tablet Take 1 tablet (300 mg total) by mouth 2 (two) times daily. 60 tablet 1  . levothyroxine (SYNTHROID, LEVOTHROID) 50 MCG tablet  Take 1 tablet (50 mcg total) by mouth at bedtime. 30 tablet 1  . oxyCODONE-acetaminophen (PERCOCET/ROXICET) 5-325 MG per tablet Take 1 tablet by mouth every 4 (four) hours as needed for severe pain (for pain). 30 tablet 0  . rOPINIRole (REQUIP) 3 MG tablet Take 1 tablet (3 mg total) by mouth at bedtime. 30 tablet 0  . triamterene-hydrochlorothiazide (DYAZIDE) 37.5-25 MG per capsule Take 1 each (1 capsule total) by mouth every morning. 30 capsule 1  . zolpidem (AMBIEN) 10 MG tablet Take 1 tablet (10 mg total) by mouth at bedtime. 30 tablet 0   No current facility-administered medications for this visit.    Past Medical History  Diagnosis Date  . Diabetes mellitus   . Hypertension   . Hyperlipidemia   . COPD (chronic obstructive pulmonary disease)   . Reflux   . Myocardial infarction   . CAD (coronary artery disease)   . Hypothyroid   . Lung nodules   . Diverticulosis   . Bipolar affective   . Normal cardiac stress test 09/2013  . Diastolic dysfunction 16/03/9603    GRADE 2  . Left-sided weakness 03/20/2014    MRI brain negative for stroke.    Past Surgical History  Procedure Laterality Date  . Back surgery    . Abdominal hysterectomy    . Appendectomy    . Tonsillectomy    . Ankle surgery    . Cholecystectomy    . Nose surgery      History   Social History  . Marital Status: Divorced    Spouse Name: N/A    Number of Children: N/A  . Years of Education: N/A   Occupational History  .      Disability for back pain   Social History Main Topics  . Smoking status: Current Every Day Smoker -- 1.00 packs/day for 40 years    Types: Cigarettes  . Smokeless tobacco: Never Used  . Alcohol Use: No  . Drug Use: No  . Sexual Activity: Not on file   Other Topics Concern  . Not on file   Social History Narrative     No family history of premature CAD in 1st degree relatives.  Prior to Admission medications   Medication Sig Start Date End Date Taking? Authorizing  Provider  acetaminophen-codeine (TYLENOL #3) 300-30 MG per tablet Take 1 tablet by mouth every 8 (eight) hours as needed.  02/11/14  Yes Historical Provider, MD  cloNIDine (CATAPRES - DOSED IN MG/24 HR) 0.3 mg/24hr patch Place 1 patch (0.3 mg total) onto the skin once a week. Sundays 03/20/14  Yes Rexene Alberts, MD  dexlansoprazole (DEXILANT) 60 MG capsule Take 1 capsule (60 mg total) by mouth every evening. 03/20/14  Yes Rexene Alberts, MD  EPINEPHrine (EPI-PEN) 0.3 mg/0.3 mL DEVI Inject 0.3 mg into the muscle once.   Yes Historical Provider, MD  furosemide (LASIX) 20 MG tablet Take 1 tablet (20 mg total) by mouth daily as needed for fluid. 03/20/14  Yes Rexene Alberts, MD  gabapentin (NEURONTIN) 600 MG tablet Take 1 tablet (600 mg total) by mouth 3 (  three) times daily. 03/20/14  Yes Rexene Alberts, MD  isosorbide mononitrate (IMDUR) 60 MG 24 hr tablet Take 1 tablet (60 mg total) by mouth every evening. 03/20/14  Yes Rexene Alberts, MD  labetalol (NORMODYNE) 300 MG tablet Take 1 tablet (300 mg total) by mouth 2 (two) times daily. 03/20/14  Yes Rexene Alberts, MD  levothyroxine (SYNTHROID, LEVOTHROID) 50 MCG tablet Take 1 tablet (50 mcg total) by mouth at bedtime. 03/20/14  Yes Rexene Alberts, MD  oxyCODONE-acetaminophen (PERCOCET/ROXICET) 5-325 MG per tablet Take 1 tablet by mouth every 4 (four) hours as needed for severe pain (for pain). 03/20/14  Yes Rexene Alberts, MD  rOPINIRole (REQUIP) 3 MG tablet Take 1 tablet (3 mg total) by mouth at bedtime. 03/20/14  Yes Rexene Alberts, MD  triamterene-hydrochlorothiazide (DYAZIDE) 37.5-25 MG per capsule Take 1 each (1 capsule total) by mouth every morning. 03/20/14  Yes Rexene Alberts, MD  zolpidem (AMBIEN) 10 MG tablet Take 1 tablet (10 mg total) by mouth at bedtime. 03/20/14  Yes Rexene Alberts, MD     Review of systems complete and found to be negative unless listed above in HPI     Physical exam Height 5\' 7"  (1.702 m), weight 213 lb (96.616 kg). General:  NAD Neck: No JVD, no thyromegaly or thyroid nodule.  Lungs: Clear to auscultation bilaterally with normal respiratory effort. CV: Nondisplaced PMI. Regular rate and rhythm, normal S1/S2, no S3/S4, no murmur.  No peripheral edema.  No carotid bruit.  Normal pedal pulses.  Abdomen: Soft, nontender, obese, no distention.  Skin: Intact without lesions or rashes.  Neurologic: Alert and oriented x 3.  Psych: Normal affect. Extremities: No clubbing or cyanosis.  HEENT: Normal.   ECG: Most recent ECG reviewed.  Labs:   Lab Results  Component Value Date   WBC 9.4 03/20/2014   HGB 12.8 03/20/2014   HCT 38.8 03/20/2014   MCV 89.6 03/20/2014   PLT 236 03/20/2014   No results for input(s): NA, K, CL, CO2, BUN, CREATININE, CALCIUM, PROT, BILITOT, ALKPHOS, ALT, AST, GLUCOSE in the last 168 hours.  Invalid input(s): LABALBU Lab Results  Component Value Date   CKTOTAL 47 11/23/2011   CKMB 1.8 11/23/2011   TROPONINI <0.30 03/19/2014    Lab Results  Component Value Date   CHOL 133 03/19/2014   CHOL 160 11/23/2011   Lab Results  Component Value Date   HDL 30* 03/19/2014   HDL 34* 11/23/2011   Lab Results  Component Value Date   LDLCALC 77 03/19/2014   LDLCALC 98 11/23/2011   Lab Results  Component Value Date   TRIG 131 03/19/2014   TRIG 139 11/23/2011   Lab Results  Component Value Date   CHOLHDL 4.4 03/19/2014   CHOLHDL 4.7 11/23/2011   No results found for: LDLDIRECT       Studies: No results found.  ASSESSMENT AND PLAN:  1. Malignant HTN: Will start azilsartan 80 mg once daily in addition to her current medications which include clonidine patch 0.3 mg per 24 hours, triamterene-hydrochlorothiazide 37.5-25 mg daily, and labetalol 300 mg twice daily. 2. CAD: I believe her chest pain is likely a function of demand ischemia from malignant hypertension. For the time being, I will continue Imdur 60 mg daily. I would consider restarting aspirin 81 mg daily. I will add  low-dose statin therapy with simvastatin 20 mg daily. She is already on a beta blocker. 3. Hypothyroidism: Controlled with normal hormone levels in October. Continue levothyroxine 50 g daily. 4. Hyperlipidemia:  Commence therapy with simvastatin 20 mg daily given in, and coronary artery disease and history of myocardial infarction. Will check LFT's in 6 weeks. Lipids as noted above. 5. Chronic diastolic heart failure: Euvolemic at present. Takes Lasix prn. Goal is control BP.  Dispo: f/u 3-4 weeks.  Time spent: 40 minutes, of which greater than 50% was spent reviewing coronary artery disease history with the patient, prior procedures, prior attempts at malignant hypertensive management, as well as current medication strategy.  Signed: Kate Sable, M.D., F.A.C.C.  04/10/2014, 8:42 AM

## 2014-04-11 ENCOUNTER — Telehealth: Payer: Self-pay | Admitting: *Deleted

## 2014-04-11 NOTE — Telephone Encounter (Signed)
Pt made aware that edarbi is $10 at [facility]. Pt will call when she is out of samples.

## 2014-04-15 ENCOUNTER — Encounter (HOSPITAL_COMMUNITY): Payer: Self-pay

## 2014-04-15 ENCOUNTER — Emergency Department (HOSPITAL_COMMUNITY): Payer: Medicare Other

## 2014-04-15 ENCOUNTER — Observation Stay (HOSPITAL_COMMUNITY)
Admission: EM | Admit: 2014-04-15 | Discharge: 2014-04-18 | Disposition: A | Discharge status: Home / Self Care | Payer: Medicare Other | Attending: Family Medicine | Admitting: Family Medicine

## 2014-04-15 DIAGNOSIS — R079 Chest pain, unspecified: Secondary | ICD-10-CM | POA: Diagnosis not present

## 2014-04-15 DIAGNOSIS — Z880 Allergy status to penicillin: Secondary | ICD-10-CM | POA: Insufficient documentation

## 2014-04-15 DIAGNOSIS — E039 Hypothyroidism, unspecified: Secondary | ICD-10-CM | POA: Diagnosis not present

## 2014-04-15 DIAGNOSIS — K5790 Diverticulosis of intestine, part unspecified, without perforation or abscess without bleeding: Secondary | ICD-10-CM | POA: Insufficient documentation

## 2014-04-15 DIAGNOSIS — E785 Hyperlipidemia, unspecified: Secondary | ICD-10-CM | POA: Insufficient documentation

## 2014-04-15 DIAGNOSIS — I2510 Atherosclerotic heart disease of native coronary artery without angina pectoris: Secondary | ICD-10-CM | POA: Diagnosis not present

## 2014-04-15 DIAGNOSIS — I5189 Other ill-defined heart diseases: Secondary | ICD-10-CM | POA: Diagnosis present

## 2014-04-15 DIAGNOSIS — M6289 Other specified disorders of muscle: Secondary | ICD-10-CM | POA: Insufficient documentation

## 2014-04-15 DIAGNOSIS — J449 Chronic obstructive pulmonary disease, unspecified: Secondary | ICD-10-CM | POA: Diagnosis present

## 2014-04-15 DIAGNOSIS — E119 Type 2 diabetes mellitus without complications: Secondary | ICD-10-CM | POA: Diagnosis not present

## 2014-04-15 DIAGNOSIS — R110 Nausea: Secondary | ICD-10-CM | POA: Insufficient documentation

## 2014-04-15 DIAGNOSIS — I10 Essential (primary) hypertension: Secondary | ICD-10-CM | POA: Diagnosis not present

## 2014-04-15 DIAGNOSIS — K219 Gastro-esophageal reflux disease without esophagitis: Secondary | ICD-10-CM | POA: Insufficient documentation

## 2014-04-15 DIAGNOSIS — Z0389 Encounter for observation for other suspected diseases and conditions ruled out: Secondary | ICD-10-CM

## 2014-04-15 DIAGNOSIS — IMO0001 Reserved for inherently not codable concepts without codable children: Secondary | ICD-10-CM | POA: Diagnosis present

## 2014-04-15 DIAGNOSIS — Z720 Tobacco use: Secondary | ICD-10-CM | POA: Diagnosis not present

## 2014-04-15 DIAGNOSIS — Z79899 Other long term (current) drug therapy: Secondary | ICD-10-CM | POA: Insufficient documentation

## 2014-04-15 DIAGNOSIS — F319 Bipolar disorder, unspecified: Secondary | ICD-10-CM | POA: Insufficient documentation

## 2014-04-15 DIAGNOSIS — E876 Hypokalemia: Secondary | ICD-10-CM | POA: Diagnosis present

## 2014-04-15 HISTORY — DX: Gastro-esophageal reflux disease without esophagitis: K21.9

## 2014-04-15 MED ORDER — MORPHINE SULFATE 4 MG/ML IJ SOLN
4.0000 mg | Freq: Once | INTRAMUSCULAR | Status: AC
  Administered 2014-04-15: 4 mg via INTRAVENOUS
  Filled 2014-04-15: qty 1

## 2014-04-15 MED ORDER — NITROGLYCERIN 2 % TD OINT
1.0000 [in_us] | TOPICAL_OINTMENT | Freq: Once | TRANSDERMAL | Status: AC
  Administered 2014-04-15: 1 [in_us] via TOPICAL
  Filled 2014-04-15: qty 1

## 2014-04-15 NOTE — ED Provider Notes (Signed)
CSN: [id number]     Arrival date & time 04/15/14  2302 History  This chart was scribe for Katrina Grout, MD by Katrina [name], ED Scribe. The patient was seen in room APA03/APA03 and the patient's care was started at 11:12 PM.    Chief Complaint  Patient presents with  . Chest Pain    The history is provided by the patient. No language interpreter was used.   HPI Comments: Katrina [name] is a 53 y.o. female with a hx of a RBBB and "small blockages" who presents to the Emergency Department complaining of an intermittant stabbing CP that radiates to the LUE onset 4 days ago. Symptoms lasted approximately 10 minutes and there have been several per day.She reports associated SOB with exertion, nausea, diarrhea onset today, and diaphoresis. She reports taking Lasix yesterday and that helped with the swelling in her legs. She reports that she has urinated twice in the past 24 hours. She reports that she saw a Cardiologist who gave her new medication. She reports smoking. She adds that 191/117 is her baseline BP. She has been non compliant with nitroglycerin because it worsens her migraines. She is allergic to aspirin. She had a mild MI in 2000.  PCP: [facility]  Cardiologist:   Past Medical History  Diagnosis Date  . Diabetes mellitus   . Hypertension   . Hyperlipidemia   . COPD (chronic obstructive pulmonary disease)   . Reflux   . Myocardial infarction   . CAD (coronary artery disease)   . Hypothyroid   . Lung nodules   . Diverticulosis   . Bipolar affective   . Normal cardiac stress test 09/2013  . Diastolic dysfunction 62/26/3335    GRADE 2  . Left-sided weakness 03/20/2014    MRI brain negative for stroke.   Past Surgical History  Procedure Laterality Date  . Back surgery    . Abdominal hysterectomy    . Appendectomy    . Tonsillectomy    . Ankle surgery    . Cholecystectomy    . Nose surgery     Family History  Problem Relation Age of Onset  . Coronary artery  disease Father 36  . Emphysema Father    History  Substance Use Topics  . Smoking status: Current Every Day Smoker -- 1.00 packs/day for 40 years    Types: Cigarettes  . Smokeless tobacco: Never Used  . Alcohol Use: No   OB History    No data available     Review of Systems  Constitutional: Negative for fever.  HENT: Negative for sneezing.   Respiratory: Positive for shortness of breath.   Cardiovascular: Positive for chest pain.  Gastrointestinal: Positive for nausea.  Genitourinary: Negative for dysuria.      Allergies  Penicillins; Pineapple; Strawberry; and Aspirin  Home Medications   Prior to Admission medications   Medication Sig Start Date End Date Taking? Authorizing Provider  Azilsartan Medoxomil 80 MG TABS Take 1 tablet (80 mg total) by mouth daily. 04/10/14  Yes Herminio Commons, MD  cloNIDine (CATAPRES - DOSED IN MG/24 HR) 0.3 mg/24hr patch Place 1 patch (0.3 mg total) onto the skin once a week. Sundays 03/20/14  Yes Rexene Alberts, MD  dexlansoprazole (DEXILANT) 60 MG capsule Take 1 capsule (60 mg total) by mouth every evening. 03/20/14  Yes Rexene Alberts, MD  EPINEPHrine (EPI-PEN) 0.3 mg/0.3 mL DEVI Inject 0.3 mg into the muscle once.   Yes Historical Provider, MD  furosemide (LASIX) 20  MG tablet Take 1 tablet (20 mg total) by mouth daily as needed for fluid. 03/20/14  Yes Rexene Alberts, MD  gabapentin (NEURONTIN) 600 MG tablet Take 1 tablet (600 mg total) by mouth 3 (three) times daily. 03/20/14  Yes Rexene Alberts, MD  isosorbide mononitrate (IMDUR) 60 MG 24 hr tablet Take 1 tablet (60 mg total) by mouth every evening. 03/20/14  Yes Rexene Alberts, MD  labetalol (NORMODYNE) 300 MG tablet Take 1 tablet (300 mg total) by mouth 2 (two) times daily. 03/20/14  Yes Rexene Alberts, MD  levothyroxine (SYNTHROID, LEVOTHROID) 50 MCG tablet Take 1 tablet (50 mcg total) by mouth at bedtime. 03/20/14  Yes Rexene Alberts, MD  oxyCODONE-acetaminophen (PERCOCET/ROXICET) 5-325 MG  per tablet Take 1 tablet by mouth every 4 (four) hours as needed for severe pain (for pain). 03/20/14  Yes Rexene Alberts, MD  rOPINIRole (REQUIP) 3 MG tablet Take 1 tablet (3 mg total) by mouth at bedtime. 03/20/14  Yes Rexene Alberts, MD  simvastatin (ZOCOR) 20 MG tablet Take 1 tablet (20 mg total) by mouth daily. 04/10/14  Yes Herminio Commons, MD  triamterene-hydrochlorothiazide (DYAZIDE) 37.5-25 MG per capsule Take 1 each (1 capsule total) by mouth every morning. 03/20/14  Yes Rexene Alberts, MD  zolpidem (AMBIEN) 10 MG tablet Take 1 tablet (10 mg total) by mouth at bedtime. 03/20/14  Yes Rexene Alberts, MD  acetaminophen-codeine (TYLENOL #3) 300-30 MG per tablet Take 1 tablet by mouth every 8 (eight) hours as needed.  02/11/14   Historical Provider, MD   BP 205/92 mmHg  Pulse 64  Resp 24  SpO2 97% Physical Exam  Constitutional: She is oriented to person, place, and time. She appears well-developed and well-nourished.  HENT:  Head: Normocephalic and atraumatic.  Eyes: Conjunctivae and EOM are normal. Pupils are equal, round, and reactive to light.  Neck: Normal range of motion. Neck supple.  Cardiovascular: Normal rate, regular rhythm and normal heart sounds.   Pulmonary/Chest: Effort normal and breath sounds normal.  Abdominal: Soft. Bowel sounds are normal.  Musculoskeletal: Normal range of motion.  Neurological: She is alert and oriented to person, place, and time.  Skin: Skin is warm and dry.  Psychiatric: She has a normal mood and affect. Her behavior is normal.  Nursing note and vitals reviewed.   ED Course  Procedures (including critical care time) DIAGNOSTIC STUDIES: Oxygen Saturation is 100% on RA, normal by my interpretation.    COORDINATION OF CARE: 11:18 PM- Pt advised of plan for treatment and pt agrees. Discussed possibility of symptoms being related to heart and will perform appropriate tests.  Labs Review Labs Reviewed  CBC WITH DIFFERENTIAL - Abnormal; Notable  for the following:    Neutrophils Relative % 33 (*)    Lymphocytes Relative 58 (*)    Lymphs Abs 6.0 (*)    All other components within normal limits  BASIC METABOLIC PANEL - Abnormal; Notable for the following:    Potassium 3.5 (*)    Glucose, Bld 117 (*)    GFR calc non Af Amer 66 (*)    GFR calc Af Amer 76 (*)    All other components within normal limits  TROPONIN I  URINALYSIS, ROUTINE W REFLEX MICROSCOPIC    Imaging Review Dg Chest Port 1 View  04/15/2014   CLINICAL DATA:  Chest pain radiating to the left shoulder.  EXAM: PORTABLE CHEST - 1 VIEW  COMPARISON:  PA and lateral chest 11/22/2011 and 03/19/2014.  FINDINGS: The lungs are clear. No  pneumothorax or pleural effusion. Heart size is normal.  IMPRESSION: No acute disease.   Electronically Signed   By: Inge Rise M.D.   On: 04/15/2014 23:51     EKG Interpretation None      MDM   Final diagnoses:  Chest pain   Patient presents with chest pain and multiple cardiac risk factors. IV morphine, IV Toradol, nitroglycerin paste. Initial EKG, chest x-ray and troponin negative.  Admit to telemetry.   I personally performed the services described in this documentation, which was scribed in my presence. The recorded information has been reviewed and is accurate.      Nat Christen, MD 04/16/14 [phone]

## 2014-04-15 NOTE — ED Notes (Signed)
Chest pain that started on Friday, having shortness of breath. States that she has been having a stabbing pain in her left shoulder going through her left chest. States that she has not urinated in 24 hours.

## 2014-04-16 ENCOUNTER — Ambulatory Visit (HOSPITAL_COMMUNITY): Payer: Medicare Other

## 2014-04-16 ENCOUNTER — Encounter (HOSPITAL_COMMUNITY): Payer: Self-pay | Admitting: *Deleted

## 2014-04-16 DIAGNOSIS — I25119 Atherosclerotic heart disease of native coronary artery with unspecified angina pectoris: Secondary | ICD-10-CM | POA: Diagnosis not present

## 2014-04-16 DIAGNOSIS — Z720 Tobacco use: Secondary | ICD-10-CM

## 2014-04-16 DIAGNOSIS — I519 Heart disease, unspecified: Secondary | ICD-10-CM

## 2014-04-16 DIAGNOSIS — I10 Essential (primary) hypertension: Secondary | ICD-10-CM | POA: Diagnosis not present

## 2014-04-16 DIAGNOSIS — R0789 Other chest pain: Secondary | ICD-10-CM | POA: Diagnosis not present

## 2014-04-16 DIAGNOSIS — I2510 Atherosclerotic heart disease of native coronary artery without angina pectoris: Secondary | ICD-10-CM

## 2014-04-16 DIAGNOSIS — J438 Other emphysema: Secondary | ICD-10-CM | POA: Diagnosis not present

## 2014-04-16 DIAGNOSIS — J449 Chronic obstructive pulmonary disease, unspecified: Secondary | ICD-10-CM

## 2014-04-16 DIAGNOSIS — E876 Hypokalemia: Secondary | ICD-10-CM | POA: Diagnosis present

## 2014-04-16 DIAGNOSIS — R079 Chest pain, unspecified: Secondary | ICD-10-CM | POA: Diagnosis not present

## 2014-04-16 LAB — URINALYSIS, ROUTINE W REFLEX MICROSCOPIC
Bilirubin Urine: NEGATIVE
GLUCOSE, UA: NEGATIVE mg/dL
Ketones, ur: NEGATIVE mg/dL
LEUKOCYTES UA: NEGATIVE
Nitrite: NEGATIVE
PH: 6.5 (ref 5.0–8.0)
Protein, ur: NEGATIVE mg/dL
Specific Gravity, Urine: 1.025 (ref 1.005–1.030)
Urobilinogen, UA: 0.2 mg/dL (ref 0.0–1.0)

## 2014-04-16 LAB — TROPONIN I
Troponin I: <0.3 ng/mL (ref <0.30)
Troponin I: <0.3 ng/mL (ref <0.30)

## 2014-04-16 LAB — BASIC METABOLIC PANEL
Anion gap: 13 (ref 5–15)
BUN: 18 mg/dL (ref 6–23)
CALCIUM: 9.5 mg/dL (ref 8.4–10.5)
CO2: 24 mEq/L (ref 19–32)
Chloride: 105 mEq/L (ref 96–112)
Creatinine, Ser: 0.97 mg/dL (ref 0.50–1.10)
GFR calc Af Amer: 76 mL/min — ABNORMAL LOW (ref >90)
GFR, EST NON AFRICAN AMERICAN: 66 mL/min — AB (ref >90)
Glucose, Bld: 117 mg/dL — ABNORMAL HIGH (ref 70–99)
Potassium: 3.5 mEq/L — ABNORMAL LOW (ref 3.7–5.3)
Sodium: 142 mEq/L (ref 137–147)

## 2014-04-16 LAB — CBC WITH DIFFERENTIAL/PLATELET
Basophils Absolute: 0.1 10*3/uL (ref 0.0–0.1)
Basophils Relative: 1 % (ref 0–1)
EOS PCT: 3 % (ref 0–5)
Eosinophils Absolute: 0.3 10*3/uL (ref 0.0–0.7)
HCT: 40.3 % (ref 36.0–46.0)
Hemoglobin: 13.4 g/dL (ref 12.0–15.0)
LYMPHS PCT: 58 % — AB (ref 12–46)
Lymphs Abs: 6 10*3/uL — ABNORMAL HIGH (ref 0.7–4.0)
MCH: 29.8 pg (ref 26.0–34.0)
MCHC: 33.3 g/dL (ref 30.0–36.0)
MCV: 89.6 fL (ref 78.0–100.0)
MONO ABS: 0.5 10*3/uL (ref 0.1–1.0)
Monocytes Relative: 5 % (ref 3–12)
NEUTROS PCT: 33 % — AB (ref 43–77)
Neutro Abs: 3.4 10*3/uL (ref 1.7–7.7)
PLATELETS: 252 10*3/uL (ref 150–400)
RBC: 4.5 MIL/uL (ref 3.87–5.11)
RDW: 14.2 % (ref 11.5–15.5)
WBC: 10.3 10*3/uL (ref 4.0–10.5)

## 2014-04-16 LAB — URINE MICROSCOPIC-ADD ON

## 2014-04-16 MED ORDER — CLONIDINE HCL 0.3 MG/24HR TD PTWK: 0.3000 mg | MEDICATED_PATCH | TRANSDERMAL | Status: DC

## 2014-04-16 MED ORDER — MORPHINE SULFATE 4 MG/ML IJ SOLN
4.0000 mg | Freq: Once | INTRAMUSCULAR | Status: AC
  Administered 2014-04-16: 4 mg via INTRAVENOUS
  Filled 2014-04-16: qty 1

## 2014-04-16 MED ORDER — ISOSORBIDE MONONITRATE ER 60 MG PO TB24
60.0000 mg | ORAL_TABLET | Freq: Every evening | ORAL | Status: DC
  Administered 2014-04-16 – 2014-04-17 (×2): 60 mg via ORAL
  Filled 2014-04-16 (×2): qty 1

## 2014-04-16 MED ORDER — TRIAMTERENE-HCTZ 37.5-25 MG PO CAPS
1.0000 | ORAL_CAPSULE | Freq: Every morning | ORAL | Status: DC
  Filled 2014-04-16 (×2): qty 1

## 2014-04-16 MED ORDER — POTASSIUM CHLORIDE CRYS ER 20 MEQ PO TBCR
40.0000 meq | EXTENDED_RELEASE_TABLET | ORAL | Status: AC
  Administered 2014-04-16 (×2): 40 meq via ORAL
  Filled 2014-04-16 (×2): qty 2

## 2014-04-16 MED ORDER — GABAPENTIN 300 MG PO CAPS
600.0000 mg | ORAL_CAPSULE | Freq: Three times a day (TID) | ORAL | Status: DC
  Administered 2014-04-16 – 2014-04-18 (×8): 600 mg via ORAL
  Filled 2014-04-16 (×8): qty 2

## 2014-04-16 MED ORDER — ENOXAPARIN SODIUM 40 MG/0.4ML ~~LOC~~ SOLN
40.0000 mg | Freq: Every day | SUBCUTANEOUS | Status: DC
  Filled 2014-04-16 (×2): qty 0.4

## 2014-04-16 MED ORDER — ACETAMINOPHEN 325 MG PO TABS: 650.0000 mg | ORAL_TABLET | ORAL | Status: DC | PRN

## 2014-04-16 MED ORDER — DIPHENHYDRAMINE HCL 25 MG PO CAPS
25.0000 mg | ORAL_CAPSULE | Freq: Three times a day (TID) | ORAL | Status: DC | PRN
  Administered 2014-04-16: 25 mg via ORAL
  Filled 2014-04-16: qty 1

## 2014-04-16 MED ORDER — LEVOTHYROXINE SODIUM 50 MCG PO TABS
50.0000 ug | ORAL_TABLET | Freq: Every day | ORAL | Status: DC
  Administered 2014-04-16 – 2014-04-17 (×2): 50 ug via ORAL
  Filled 2014-04-16 (×2): qty 1

## 2014-04-16 MED ORDER — ONDANSETRON HCL 4 MG/2ML IJ SOLN: 4.0000 mg | Freq: Four times a day (QID) | INTRAMUSCULAR | Status: DC | PRN

## 2014-04-16 MED ORDER — KETOROLAC TROMETHAMINE 30 MG/ML IJ SOLN
INTRAMUSCULAR | Status: AC
  Filled 2014-04-16: qty 1

## 2014-04-16 MED ORDER — LABETALOL HCL 200 MG PO TABS
300.0000 mg | ORAL_TABLET | Freq: Two times a day (BID) | ORAL | Status: DC
  Administered 2014-04-16 – 2014-04-18 (×6): 300 mg via ORAL
  Filled 2014-04-16 (×7): qty 2

## 2014-04-16 MED ORDER — MORPHINE SULFATE 2 MG/ML IJ SOLN
2.0000 mg | INTRAMUSCULAR | Status: DC | PRN
  Administered 2014-04-17 – 2014-04-18 (×2): 2 mg via INTRAVENOUS
  Filled 2014-04-16 (×2): qty 1

## 2014-04-16 MED ORDER — SIMVASTATIN 20 MG PO TABS
20.0000 mg | ORAL_TABLET | Freq: Every day | ORAL | Status: DC
  Administered 2014-04-16 – 2014-04-17 (×2): 20 mg via ORAL
  Filled 2014-04-16 (×2): qty 1

## 2014-04-16 MED ORDER — FUROSEMIDE 20 MG PO TABS: 20.0000 mg | ORAL_TABLET | Freq: Every day | ORAL | Status: DC | PRN

## 2014-04-16 MED ORDER — KETOROLAC TROMETHAMINE 30 MG/ML IJ SOLN
30.0000 mg | Freq: Once | INTRAMUSCULAR | Status: AC
  Administered 2014-04-16: 30 mg via INTRAVENOUS

## 2014-04-16 MED ORDER — ZOLPIDEM TARTRATE 5 MG PO TABS
10.0000 mg | ORAL_TABLET | Freq: Every day | ORAL | Status: DC
  Administered 2014-04-16 – 2014-04-17 (×2): 10 mg via ORAL
  Filled 2014-04-16 (×2): qty 2

## 2014-04-16 MED ORDER — ISOSORBIDE MONONITRATE 20 MG PO TABS
20.0000 mg | ORAL_TABLET | Freq: Two times a day (BID) | ORAL | Status: DC
  Administered 2014-04-16: 20 mg via ORAL
  Filled 2014-04-16 (×2): qty 1

## 2014-04-16 MED ORDER — OXYCODONE-ACETAMINOPHEN 5-325 MG PO TABS
1.0000 | ORAL_TABLET | ORAL | Status: DC | PRN
  Administered 2014-04-16 – 2014-04-17 (×2): 1 via ORAL
  Filled 2014-04-16 (×2): qty 1

## 2014-04-16 MED ORDER — GABAPENTIN 600 MG PO TABS
600.0000 mg | ORAL_TABLET | Freq: Three times a day (TID) | ORAL | Status: DC
  Filled 2014-04-16 (×5): qty 1

## 2014-04-16 MED ORDER — ENOXAPARIN SODIUM 100 MG/ML ~~LOC~~ SOLN: 1.0000 mg/kg | Freq: Two times a day (BID) | SUBCUTANEOUS | Status: DC

## 2014-04-16 MED ORDER — GI COCKTAIL ~~LOC~~: 30.0000 mL | Freq: Four times a day (QID) | ORAL | Status: DC | PRN

## 2014-04-16 MED ORDER — TRIAMTERENE-HCTZ 37.5-25 MG PO TABS
1.0000 | ORAL_TABLET | Freq: Every day | ORAL | Status: DC
  Administered 2014-04-16 – 2014-04-17 (×2): 1 via ORAL
  Filled 2014-04-16 (×3): qty 1

## 2014-04-16 NOTE — Progress Notes (Addendum)
Patient refused 3:00pm blood draw for Troponin #3.  Lab attempted draw and unsuccessful. Patient refuses anyone to reattempt lab draw.  Patient educated on value of troponin lab draw related to medical diagnosis. Patient refusing bed alarm as well. Educated on fall risks. Patient refusing lovenox and hesitant on wearing SCDs. Educated on risks.

## 2014-04-16 NOTE — Progress Notes (Signed)
[facility] PROGRESS NOTE  Katrina [name] OZY:[id number] DOB: 08-31-1960 DOA: 04/15/2014 PCP: No PCP Per Patient  Assessment/Plan: Chest pain-some typical and atypical features. Hx of same. Pain free this am. Risk factors include uncontrolled HTN, smoking, obesity, hyperlipidemia, CAD (moderate nonobstructive). Stress test 09/2013 with no ischemia and EF 71%.  Troponin negative x2. EKG with NSR. Seen by cardiology 04/10/14 resulting in  med changes made to improve BP control. Note from visit indicates some concern for demand ischemia related to HTN. and recurrent CP.  Evaluated by Dr Domenic Polite who recommends improved management of BP and f/u on chest pain with improved control, as well as aspirin. Appreciate cardiology assistance  Active Problems:   HTN (hypertension): hx uncontrolled. Non-compliance may be issue. Evaluated by cardiology who recommended continuing with clonidine patch, imdur 60mg , labetolol, maxide. Would expect improved control in compliance issue. Will monitor closely.    COPD (chronic obstructive pulmonary disease): appears stable at baseline.    Tobacco abuse: cessation counseling offered. Verbalizes not interested in quitting   Diastolic dysfunction: see #1 and #2. Appears compensated. Home medications include prn lasix.  Monitor intake and output as well as daily weights   CAD (coronary artery disease): see #1.   Diabetes: diet controlled. A1c 5.9 last month. Continue to monitor.   Hypothyroid: TSH 3.6 last month.   Hypokalemia: mild. Will replete. Recheck in am.   Code Status: full Family Communication: none present Disposition Plan: home hopefully 24-48 hours   Consultants:  Dr Domenic Polite cardiology  Procedures:  none  Antibiotics:  none  HPI/Subjective: Pain free this am. Requesting shower and permission to get out of bed.   Objective: Filed Vitals:   04/16/14 0900  BP: 172/68  Pulse: 60  Temp:   Resp:     Intake/Output Summary  (Last 24 hours) at 04/16/14 0921 Last data filed at 04/16/14 0600  Gross per 24 hour  Intake      0 ml  Output    200 ml  Net   -200 ml   Filed Weights   04/16/14 0330  Weight: 97.977 kg (216 lb)    Exam:   General:  Obese appears comfortable  Cardiovascular: RRR no gallup or rub, trace LE edema PPP  Respiratory: normal effort BS somewhat distant but clear  Abdomen: obese soft +BS non-tender to palpation  Musculoskeletal: no clubbing or cyanosis   Data Reviewed: Basic Metabolic Panel:  Recent Labs Lab 04/15/14 2315  NA 142  K 3.5*  CL 105  CO2 24  GLUCOSE 117*  BUN 18  CREATININE 0.97  CALCIUM 9.5   Liver Function Tests: No results for input(s): AST, ALT, ALKPHOS, BILITOT, PROT, ALBUMIN in the last 168 hours. No results for input(s): LIPASE, AMYLASE in the last 168 hours. No results for input(s): AMMONIA in the last 168 hours. CBC:  Recent Labs Lab 04/15/14 2315  WBC 10.3  NEUTROABS 3.4  HGB 13.4  HCT 40.3  MCV 89.6  PLT 252   Cardiac Enzymes:  Recent Labs Lab 04/15/14 2315 04/16/14 0341  TROPONINI <0.30 <0.30   BNP (last 3 results)  Recent Labs  09/22/13 2200  PROBNP 259.2*   CBG: No results for input(s): GLUCAP in the last 168 hours.  No results found for this or any previous visit (from the past 240 hour(s)).   Studies: Dg Chest Port 1 View  04/15/2014   CLINICAL DATA:  Chest pain radiating to the left shoulder.  EXAM: PORTABLE CHEST - 1 VIEW  COMPARISON:  PA and lateral chest 11/22/2011 and 03/19/2014.  FINDINGS: The lungs are clear. No pneumothorax or pleural effusion. Heart size is normal.  IMPRESSION: No acute disease.   Electronically Signed   By: Inge Rise M.D.   On: 04/15/2014 23:51    Scheduled Meds: . [START ON 04/21/2014] cloNIDine  0.3 mg Transdermal Weekly  . enoxaparin (LOVENOX) injection  1 mg/kg Subcutaneous Q12H  . gabapentin  600 mg Oral TID  . isosorbide mononitrate  60 mg Oral QPM  . labetalol  300 mg  Oral BID  . levothyroxine  50 mcg Oral QHS  . simvastatin  20 mg Oral QPC supper  . triamterene-hydrochlorothiazide  1 tablet Oral Daily  . zolpidem  10 mg Oral QHS   Continuous Infusions:   Principal Problem:   Chest pain Active Problems:   COPD (chronic obstructive pulmonary disease)   HTN (hypertension)   Tobacco abuse   Diastolic dysfunction   CAD (coronary artery disease)    Time spent: 35 minutes    [facility] Pager [phone]. If 7PM-7AM, please contact night-coverage at [email or web address], password [facility] 04/16/2014, 9:21 AM  LOS: 1 day

## 2014-04-16 NOTE — H&P (Addendum)
PCP:   No PCP Per Patient   Chief Complaint:  Chest pain  HPI: 53 yo female with h/o nonobs CAD per cath in 2013 in [place], nml stress testing in April this year, chronic anginal symptoms, copd, tob abuse, bipolar disorder, dm, htn comes in with several days of sscp that radiates to her left arm.  Pt states this happens several times a week.  She has run out of her ntg sl pills months ago.  She has been on imdur which she says does not help.  She saw cardiology here in [place] last week and doing a cath was discussed but not scheduled.  No fevers.  No cough.  No swelling.  Review of Systems:  Positive and negative as per HPI otherwise all other systems are negative  Past Medical History: Past Medical History  Diagnosis Date  . Diabetes mellitus   . Hypertension   . Hyperlipidemia   . COPD (chronic obstructive pulmonary disease)   . Reflux   . Myocardial infarction   . CAD (coronary artery disease)   . Hypothyroid   . Lung nodules   . Diverticulosis   . Bipolar affective   . Normal cardiac stress test 09/2013  . Diastolic dysfunction 02/63/7858    GRADE 2  . Left-sided weakness 03/20/2014    MRI brain negative for stroke.   Past Surgical History  Procedure Laterality Date  . Back surgery    . Abdominal hysterectomy    . Appendectomy    . Tonsillectomy    . Ankle surgery    . Cholecystectomy    . Nose surgery      Medications: Prior to Admission medications   Medication Sig Start Date End Date Taking? Authorizing Provider  Azilsartan Medoxomil 80 MG TABS Take 1 tablet (80 mg total) by mouth daily. 04/10/14  Yes Herminio Commons, MD  cloNIDine (CATAPRES - DOSED IN MG/24 HR) 0.3 mg/24hr patch Place 1 patch (0.3 mg total) onto the skin once a week. Sundays 03/20/14  Yes Rexene Alberts, MD  dexlansoprazole (DEXILANT) 60 MG capsule Take 1 capsule (60 mg total) by mouth every evening. 03/20/14  Yes Rexene Alberts, MD  EPINEPHrine (EPI-PEN) 0.3 mg/0.3 mL DEVI Inject 0.3  mg into the muscle once.   Yes Historical Provider, MD  furosemide (LASIX) 20 MG tablet Take 1 tablet (20 mg total) by mouth daily as needed for fluid. 03/20/14  Yes Rexene Alberts, MD  gabapentin (NEURONTIN) 600 MG tablet Take 1 tablet (600 mg total) by mouth 3 (three) times daily. 03/20/14  Yes Rexene Alberts, MD  isosorbide mononitrate (IMDUR) 60 MG 24 hr tablet Take 1 tablet (60 mg total) by mouth every evening. 03/20/14  Yes Rexene Alberts, MD  labetalol (NORMODYNE) 300 MG tablet Take 1 tablet (300 mg total) by mouth 2 (two) times daily. 03/20/14  Yes Rexene Alberts, MD  levothyroxine (SYNTHROID, LEVOTHROID) 50 MCG tablet Take 1 tablet (50 mcg total) by mouth at bedtime. 03/20/14  Yes Rexene Alberts, MD  oxyCODONE-acetaminophen (PERCOCET/ROXICET) 5-325 MG per tablet Take 1 tablet by mouth every 4 (four) hours as needed for severe pain (for pain). 03/20/14  Yes Rexene Alberts, MD  rOPINIRole (REQUIP) 3 MG tablet Take 1 tablet (3 mg total) by mouth at bedtime. 03/20/14  Yes Rexene Alberts, MD  simvastatin (ZOCOR) 20 MG tablet Take 1 tablet (20 mg total) by mouth daily. 04/10/14  Yes Herminio Commons, MD  triamterene-hydrochlorothiazide (DYAZIDE) 37.5-25 MG per capsule Take 1 each (1 capsule total)  by mouth every morning. 03/20/14  Yes Rexene Alberts, MD  zolpidem (AMBIEN) 10 MG tablet Take 1 tablet (10 mg total) by mouth at bedtime. 03/20/14  Yes Rexene Alberts, MD  acetaminophen-codeine (TYLENOL #3) 300-30 MG per tablet Take 1 tablet by mouth every 8 (eight) hours as needed.  02/11/14   Historical Provider, MD    Allergies:   Allergies  Allergen Reactions  . Penicillins Anaphylaxis  . Pineapple Rash  . Strawberry Rash  . Aspirin     Social History:  reports that she has been smoking Cigarettes.  She has a 40 pack-year smoking history. She has never used smokeless tobacco. She reports that she does not drink alcohol or use illicit drugs.  Family History: Family History  Problem Relation Age of  Onset  . Coronary artery disease Father 48  . Emphysema Father     Physical Exam: Filed Vitals:   04/16/14 0100 04/16/14 0130 04/16/14 0159 04/16/14 0230  BP: 180/104 192/94 205/92 188/88  Pulse: 63 60 64 67  Resp: 25 21 24 20   SpO2: 100% 98% 97% 97%   General appearance: alert, cooperative and no distress Head: Normocephalic, without obvious abnormality, atraumatic Eyes: negative Nose: Nares normal. Septum midline. Mucosa normal. No drainage or sinus tenderness. Neck: no JVD and supple, symmetrical, trachea midline Lungs: clear to auscultation bilaterally Heart: regular rate and rhythm, S1, S2 normal, no murmur, click, rub or gallop Abdomen: soft, non-tender; bowel sounds normal; no masses,  no organomegaly Extremities: extremities normal, atraumatic, no cyanosis or edema Pulses: 2+ and symmetric Skin: Skin color, texture, turgor normal. No rashes or lesions Neurologic: Grossly normal    Labs on Admission:   Recent Labs  04/15/14 2315  NA 142  K 3.5*  CL 105  CO2 24  GLUCOSE 117*  BUN 18  CREATININE 0.97  CALCIUM 9.5    Recent Labs  04/15/14 2315  WBC 10.3  NEUTROABS 3.4  HGB 13.4  HCT 40.3  MCV 89.6  PLT 252    Recent Labs  04/15/14 2315  TROPONINI <0.30   Radiological Exams on Admission: Dg Chest Port 1 View  04/15/2014   CLINICAL DATA:  Chest pain radiating to the left shoulder.  EXAM: PORTABLE CHEST - 1 VIEW  COMPARISON:  PA and lateral chest 11/22/2011 and 03/19/2014.  FINDINGS: The lungs are clear. No pneumothorax or pleural effusion. Heart size is normal.  IMPRESSION: No acute disease.   Electronically Signed   By: Inge Rise M.D.   On: 04/15/2014 23:51    Assessment/Plan  53 yo female with atypical chest pain/??angina with multiple risk factors  Principal Problem:   Chest pain-  obs on tele.  Romi.  Cover with lovenox.  We do not have her previous cath results in 2013.  She says she has had a heart attack before, but has never had  stenting to her coronaries that she knows of.  Consult cardiology to see if she cath would be beneficial due to the chronicity of her symptoms.  Active Problems:  Stable unless o/w noted   COPD (chronic obstructive pulmonary disease)   HTN (hypertension)   Tobacco abuse   Diastolic dysfunction   CAD (coronary artery disease)  obs on tele.  Full code.  Await cards consult.  Gave extra dose of imdur tonight.  C/o headache with ntg paste, will remove.  DAVID,RACHAL A 04/16/2014, 3:17 AM

## 2014-04-16 NOTE — Consult Note (Signed)
Primary cardiologist: Dr. Kate Sable Consulting cardiologist: Dr. Satira Sark  Reason for consultation: Chest pain, hypertension  Clinical Summary Katrina [name] is a 53 y.o.female with past medical history outlined below, recently seen in the office by Dr. Bronson Ing on November 4 - I reviewed the available records and his recent note. She presents to the hospital complaining of recurring chest pressure, seems to be sporadic and not predictable, radiation to the left arm, also associated with shortness of breath. She states that over the last 6 months her symptoms have increased in terms of frequency and intensity. She tells me that she has been taking her medications regularly of late. She does not report definite improvement with nitroglycerin at this facility, is on long-acting nitrates at home. Cardiac markers are normal at this time arguing against ACS. ECG shows sinus rhythm with nonspecific ST-T changes and motion artifact.  She reports a long-standing history of intermittent chest pain, at least over the last few years, also longer standing problems with uncontrolled hypertension. She has a history of moderate CAD based on outside heart catheterization in [place] as detailed below. Reportedly had "70%" and "40%" blockages. She has not required any coronary interventions. Also tells me that she may have had a "kink" in her left renal artery. No specific records to further confirm this.  She states that she has been on several different antihypertensives over the years. She mentions specifically Norvasc, Coreg, HCTZ, and lisinopril. She does not stated intolerances to all of these medications, but has had trouble intermittently with fatigue on certain preparations.  Recent echocardiogram in October reported LVEF 60-65% with grade 2 diastolic dysfunction, moderate LVH with severe basal septal hypertrophy, mild mitral regurgitation, mild tricuspid regurgitation, PASP 36 mm mercury.  Lexiscan Myoview in April of this year reported no evidence of scar or ischemia with LVEF 71%.   Allergies  Allergen Reactions  . Penicillins Anaphylaxis  . Pineapple Rash  . Strawberry Rash  . Poultry Meal     Allergic to [place]    Medications Scheduled Medications: . [START ON 04/21/2014] cloNIDine  0.3 mg Transdermal Weekly  . enoxaparin (LOVENOX) injection  1 mg/kg Subcutaneous Q12H  . gabapentin  600 mg Oral TID  . isosorbide mononitrate  60 mg Oral QPM  . labetalol  300 mg Oral BID  . levothyroxine  50 mcg Oral QHS  . simvastatin  20 mg Oral QPC supper  . triamterene-hydrochlorothiazide  1 tablet Oral Daily  . zolpidem  10 mg Oral QHS     PRN Medications: acetaminophen, furosemide, gi cocktail, morphine injection, ondansetron (ZOFRAN) IV, oxyCODONE-acetaminophen   Past Medical History  Diagnosis Date  . Type 2 diabetes mellitus   . Essential hypertension   . Hyperlipidemia   . COPD (chronic obstructive pulmonary disease)   . GERD (gastroesophageal reflux disease)   . CAD (coronary artery disease)     Moderate nonobstructive 2012-2013, [place]  . Hypothyroid   . Lung nodules   . Diverticulosis   . Bipolar affective   . Left-sided weakness 03/20/2014    MRI brain negative for stroke.    Past Surgical History  Procedure Laterality Date  . Back surgery    . Abdominal hysterectomy    . Appendectomy    . Tonsillectomy    . Ankle surgery    . Cholecystectomy    . Nose surgery      Family History  Problem Relation Age of Onset  . Coronary artery disease Father 105  .  Emphysema Father     Social History Ms. Woodcox reports that she has been smoking Cigarettes.  She has a 40 pack-year smoking history. She has never used smokeless tobacco. Ms. Langseth reports that she does not drink alcohol.  Review of Systems Complete review of systems negative except as otherwise outlined in the clinical summary and also the following. No palpitations or syncope.  Stable appetite. No reported bleeding diathesis. No orthopnea or PND. Does report intermittent "swelling" and has to use Lasix intermittently.  Physical Examination Blood pressure 172/68, pulse 60, temperature 98.8 F (37.1 C), resp. rate 20, height 5\' 7"  (1.702 m), weight 216 lb (97.977 kg), SpO2 94 %.  Intake/Output Summary (Last 24 hours) at 04/16/14 0923 Last data filed at 04/16/14 0600  Gross per 24 hour  Intake      0 ml  Output    200 ml  Net   -200 ml    Telemetry: Sinus rhythm.  Obese woman, appears comfortable at rest. HEENT: Conjunctiva and lids normal, oropharynx clear with poor dentition. Neck: Supple, no elevated JVP or carotid bruits, no thyromegaly. Lungs: Clear to auscultation, nonlabored breathing at rest. Cardiac: Regular rate and rhythm, no S3, soft systolic murmur, no pericardial rub. Abdomen: Soft, nontender, bowel sounds present, no guarding or rebound. Extremities: Trace edema, distal pulses 2+. Skin: Warm and dry. Musculoskeletal: No kyphosis. Neuropsychiatric: Alert and oriented x3, affect grossly appropriate.   Lab Results  Basic Metabolic Panel:  Recent Labs Lab 04/15/14 2315  NA 142  K 3.5*  CL 105  CO2 24  GLUCOSE 117*  BUN 18  CREATININE 0.97  CALCIUM 9.5    CBC:  Recent Labs Lab 04/15/14 2315  WBC 10.3  NEUTROABS 3.4  HGB 13.4  HCT 40.3  MCV 89.6  PLT 252    Cardiac Enzymes:  Recent Labs Lab 04/15/14 2315 04/16/14 0341  TROPONINI <0.30 <0.30    Imaging EXAM: PORTABLE CHEST - 1 VIEW  COMPARISON: PA and lateral chest 11/22/2011 and 03/19/2014.  FINDINGS: The lungs are clear. No pneumothorax or pleural effusion. Heart size is normal.  IMPRESSION: No acute disease.   Impression  1. Recurring chest pain symptoms in the setting of uncontrolled hypertension and hypertensive heart disease as well as previously documented moderate nonobstructive CAD as outlined above. Cardiac markers argue against ACS, ECG  shows no acute ST segment changes. She had an ischemic workup in April of this year that was reassuring, recent echocardiogram as detailed above. Could be that symptoms are related to demand ischemia and even component of microvascular angina.  2. Long-standing history of hypertension, likely essential, poorly controlled. She has been on a variety of different medications over the years. Not entirely certain about her compliance at home. She mentions a possible "kink" in her left renal artery, details are not at all clear. I suspect that renal artery stenosis has already been worked up on prior evaluations, renal function is normal at this time. May ultimately need to consider at least a renal artery duplex however.  3. Hyperlipidemia, on statin therapy.  4. Type 2 diabetes mellitus.  5. COPD. Chest x-ray without acute findings.   Recommendations  Reviewed records and discussed in detail with the patient. At this point we will continue clonidine patch, Imdur at 60 mg daily, labetalol, Maxide, and potassium supplements. If noncompliance is an issue, we should see better blood pressure control in the hospital. Add low-dose aspirin. We will follow-up on chest pain/symptom control with better blood pressure management  before deciding on any additional cardiac studies. Norvasc might be a good next addition if blood pressure remains elevated on present regimen. If her chest pain continues to progress, consideration can be given to a heart catheterization to reassess coronary anatomy, although ideally would like to see blood pressure control first.   Satira Sark, M.D., F.A.C.C.

## 2014-04-16 NOTE — Progress Notes (Signed)
UR completed 

## 2014-04-17 DIAGNOSIS — R079 Chest pain, unspecified: Secondary | ICD-10-CM | POA: Diagnosis not present

## 2014-04-17 DIAGNOSIS — R110 Nausea: Secondary | ICD-10-CM | POA: Diagnosis not present

## 2014-04-17 DIAGNOSIS — I10 Essential (primary) hypertension: Secondary | ICD-10-CM | POA: Insufficient documentation

## 2014-04-17 DIAGNOSIS — E119 Type 2 diabetes mellitus without complications: Secondary | ICD-10-CM | POA: Diagnosis not present

## 2014-04-17 DIAGNOSIS — I25119 Atherosclerotic heart disease of native coronary artery with unspecified angina pectoris: Secondary | ICD-10-CM | POA: Diagnosis not present

## 2014-04-17 LAB — BASIC METABOLIC PANEL
ANION GAP: 13 (ref 5–15)
BUN: 15 mg/dL (ref 6–23)
CALCIUM: 9.9 mg/dL (ref 8.4–10.5)
CO2: 24 mEq/L (ref 19–32)
CREATININE: 0.84 mg/dL (ref 0.50–1.10)
Chloride: 106 mEq/L (ref 96–112)
GFR calc non Af Amer: 78 mL/min — ABNORMAL LOW (ref >90)
Glucose, Bld: 89 mg/dL (ref 70–99)
Potassium: 4.4 mEq/L (ref 3.7–5.3)
Sodium: 143 mEq/L (ref 137–147)

## 2014-04-17 MED ORDER — HYDRALAZINE HCL 20 MG/ML IJ SOLN
5.0000 mg | Freq: Four times a day (QID) | INTRAMUSCULAR | Status: DC | PRN
  Administered 2014-04-17: 5 mg via INTRAVENOUS
  Filled 2014-04-17: qty 1

## 2014-04-17 MED ORDER — AMLODIPINE BESYLATE 5 MG PO TABS
5.0000 mg | ORAL_TABLET | Freq: Every day | ORAL | Status: DC
  Administered 2014-04-17: 5 mg via ORAL
  Filled 2014-04-17: qty 1

## 2014-04-17 MED ORDER — HYDRALAZINE HCL 20 MG/ML IJ SOLN
10.0000 mg | Freq: Four times a day (QID) | INTRAMUSCULAR | Status: DC | PRN
  Filled 2014-04-17: qty 1

## 2014-04-17 NOTE — Progress Notes (Signed)
    Primary cardiologist: Dr. Kate Sable  Seen for followup: Uncontrolled hypertension, chest pain  Subjective:    Up in chair, no active chest pain or shortness of breath.  Objective:   Temp:  [98.5 F (36.9 C)-98.6 F (37 C)] 98.5 F (36.9 C) (11/11 0356) Pulse Rate:  [57-75] 75 (11/11 0356) Resp:  [20] 20 (11/11 0356) BP: (155-210)/(68-92) 168/82 mmHg (11/11 0356) SpO2:  [95 %-100 %] 95 % (11/11 0356) Weight:  [220 lb 12.8 oz (100.154 kg)] 220 lb 12.8 oz (100.154 kg) (11/11 0546) Last BM Date: 04/15/14  Filed Weights   04/16/14 0330 04/17/14 0546  Weight: 216 lb (97.977 kg) 220 lb 12.8 oz (100.154 kg)    Intake/Output Summary (Last 24 hours) at 04/17/14 3300 Last data filed at 04/16/14 1852  Gross per 24 hour  Intake      0 ml  Output      0 ml  Net      0 ml    Telemetry: Sinus rhythm.  Exam:  General: Appears comfortable.  Lungs: Clear, nonlabored.  Cardiac: RRR, no gallop.  Extremities: No pitting.   Lab Results:  Basic Metabolic Panel:  Recent Labs Lab 04/15/14 2315 04/17/14 0620  NA 142 143  K 3.5* 4.4  CL 105 106  CO2 24 24  GLUCOSE 117* 89  BUN 18 15  CREATININE 0.97 0.84  CALCIUM 9.5 9.9    CBC:  Recent Labs Lab 04/15/14 2315  WBC 10.3  HGB 13.4  HCT 40.3  MCV 89.6  PLT 252    Cardiac Enzymes:  Recent Labs Lab 04/16/14 0341 04/16/14 0906 04/16/14 2126  TROPONINI <0.30 <0.30 <0.30    BNP:  Recent Labs  09/22/13 2200  PROBNP 259.2*     Medications:   Scheduled Medications: . [START ON 04/21/2014] cloNIDine  0.3 mg Transdermal Weekly  . enoxaparin (LOVENOX) injection  40 mg Subcutaneous Daily  . gabapentin  600 mg Oral TID  . isosorbide mononitrate  60 mg Oral QPM  . labetalol  300 mg Oral BID  . levothyroxine  50 mcg Oral QHS  . simvastatin  20 mg Oral QPC supper  . triamterene-hydrochlorothiazide  1 tablet Oral Daily  . zolpidem  10 mg Oral QHS      PRN Medications:  acetaminophen,  diphenhydrAMINE, furosemide, gi cocktail, morphine injection, ondansetron (ZOFRAN) IV, oxyCODONE-acetaminophen   Assessment:   1. Recurring chest pain symptoms in the setting of uncontrolled hypertension and hypertensive heart disease as well as previously documented moderate nonobstructive CAD. Cardiac markers are normal. She had an ischemic workup in April of this year that was reassuring. Could be that symptoms are related to demand ischemia and even component of microvascular angina. Blood pressure is still not well controlled.  2. Poorly controlled hypertension. She has reportedly been on a variety of different medications over the years. Current regimen includes clonidine patch, Imdur, labetalol, and Maxide.  3. Hyperlipidemia, on statin therapy.   Plan/Discussion:    Discussed with patient. We will add Norvasc at 5 mg daily, continue remaining medications. Would aim to get blood pressure better controlled prior to considering any further cardiac ischemic evaluations. As noted previously, probably needs to have a follow-up renal artery duplex scan to exclude RAS, although I suspect that this has been evaluated in the past.   Katrina [name], M.D., F.A.C.C.

## 2014-04-17 NOTE — Progress Notes (Signed)
PROGRESS NOTE  Katrina [name] GDJ:[id number] DOB: Jul 13, 1960 DOA: 04/15/2014 PCP: No PCP Per Patient Primary cardiologist: Dr. Kate Sable  Summary: 53 year old woman with history of malignant hypertension, nonobstructive coronary disease with normal stress test 09/2013, COPD presented with substernal chest pain.Admitted for chest pain.  Assessment/Plan: 1. Chest pain in the setting of malignant hypertension. No evidence of ACS.cardiology following, further recommendations pending on whether patient will need further evaluation prior to discharge. 2. Malignant hypertension. Asymptomatic but remains poorly controlled. Norvasc added today. Patient reports this is long-standing with it never being below 622 (systolic). 3. Diabetes mellitus, diet controlled.stable. 4. Nonobstructive coronary artery disease. 5.  Grade 2 diastolic dysfunction with left ventricular hypertrophy. 6. Tobacco dependence.recommend cessation. 7. COPD. 8. Obesity.   Continue management of malignant hypertension as per cardiology. Patient is asymptomatic and by her history this is a long-standing issue.no evidence of end organ dysfunction.  Code Status: full code DVT prophylaxis: Lovenox Family Communication:  Disposition Plan: home when improved  Murray Hodgkins, MD  [facility]  Pager [phone] If 7PM-7AM, please contact night-coverage at [email or web address], password [facility] 04/17/2014, 4:29 PM  LOS: 2 days   Consultants:  cardiology  Procedures:    Antibiotics:    HPI/Subjective: Cardiology added Norvasc. Recommending better blood pressure control.  Patient reports intermittent chest pain. She reports her blood pressure "is never below 200". She is seenit as high as 119 systolic.  Objective: Filed Vitals:   04/17/14 0356 04/17/14 0546 04/17/14 1300 04/17/14 1430  BP: 168/82  214/85 220/118  Pulse: 75  75   Temp: 98.5 F (36.9 C)  98.6 F (37 C)   TempSrc: Oral  Oral   Resp: 20  20     Height:      Weight:  100.154 kg (220 lb 12.8 oz)    SpO2: 95%  98%     Intake/Output Summary (Last 24 hours) at 04/17/14 1629 Last data filed at 04/17/14 1420  Gross per 24 hour  Intake      0 ml  Output    250 ml  Net   -250 ml     Filed Weights   04/16/14 0330 04/17/14 0546  Weight: 97.977 kg (216 lb) 100.154 kg (220 lb 12.8 oz)    Exam:     Afebrile, remains hypertensive with systolic blood pressure 417-408, diastolic blood pressure of 118.  General: appears calm, comfortable sitting in chair.  Psych: alert. Speech fluent and appropriate.  CV: regular rate and rhythm. No murmur, rub or gallop. Telemetry sinus rhythm.  Respiratory: clear to auscultation bilaterally. No wheezes, rales or rhonchi. Normal respiratory effort.  Data Reviewed:  Basic metabolic panel unremarkable.  Troponins negative.  Scheduled Meds: . amLODipine  5 mg Oral Daily  . [START ON 04/21/2014] cloNIDine  0.3 mg Transdermal Weekly  . enoxaparin (LOVENOX) injection  40 mg Subcutaneous Daily  . gabapentin  600 mg Oral TID  . isosorbide mononitrate  60 mg Oral QPM  . labetalol  300 mg Oral BID  . levothyroxine  50 mcg Oral QHS  . simvastatin  20 mg Oral QPC supper  . triamterene-hydrochlorothiazide  1 tablet Oral Daily  . zolpidem  10 mg Oral QHS   Continuous Infusions:   Principal Problem:   Chest pain Active Problems:   COPD (chronic obstructive pulmonary disease)   HTN (hypertension)   Tobacco abuse   Diastolic dysfunction   CAD (coronary artery disease)   Hypokalemia   Time spent 20 minutes

## 2014-04-17 NOTE — Progress Notes (Signed)
Patient's BP has been 200's over 100's when taking BP manually. MD increased hydralazine 5 mg to 10 mg because 5 mg was not effective. Patient refused 10 mg stating "[5 mg] made my head and chest pain worse, why would I take an increased dose". Patient still refuses cardiac diet. Patient refuses SCD's and Lovenox. Patient refuses bed alarm for fall prevention. Patient educated about the importance of lowering and monitoring BP. When asked if she monitors her BP at home, she replied, "200 is good for me, sometimes its in the 300's". When educated about strokes she replied "been there, done that". Patient is showing no symptoms of hypertension except having a headache. Patient is stable at this time. MD made aware.

## 2014-04-17 NOTE — Progress Notes (Signed)
Per report from dietary services, pt refuses to eat any food provided on a cardiac diet. Patient educated on importance of heart healthy foods--avoiding foods high in sodium and fat. Patient states "I don't care." Fast food entrees have been brought by visitors. Will continue to monitor patient.

## 2014-04-17 NOTE — Progress Notes (Signed)
Per patient's request I brought her the Advance Directives packet and discussed the information. We made a plan to complete it tomorrow morning.

## 2014-04-17 NOTE — Progress Notes (Signed)
Patient's blood pressure is 212/166 mmHg despite PRN Hydralazine given. Patient states this is her normal blood pressure. She remains asymptomatic, except for complaint of "bad headache". Dr. Sarajane Jews made aware. Will continue to monitor patient.

## 2014-04-18 ENCOUNTER — Telehealth (HOSPITAL_COMMUNITY): Payer: Self-pay

## 2014-04-18 DIAGNOSIS — E119 Type 2 diabetes mellitus without complications: Secondary | ICD-10-CM | POA: Diagnosis not present

## 2014-04-18 DIAGNOSIS — I25119 Atherosclerotic heart disease of native coronary artery with unspecified angina pectoris: Secondary | ICD-10-CM | POA: Diagnosis not present

## 2014-04-18 DIAGNOSIS — I10 Essential (primary) hypertension: Secondary | ICD-10-CM | POA: Diagnosis not present

## 2014-04-18 DIAGNOSIS — R110 Nausea: Secondary | ICD-10-CM | POA: Diagnosis not present

## 2014-04-18 DIAGNOSIS — R079 Chest pain, unspecified: Secondary | ICD-10-CM | POA: Diagnosis not present

## 2014-04-18 LAB — TROPONIN I: Troponin I: <0.3 ng/mL (ref <0.30)

## 2014-04-18 MED ORDER — AMLODIPINE BESYLATE 5 MG PO TABS
10.0000 mg | ORAL_TABLET | Freq: Every day | ORAL | Status: DC
  Administered 2014-04-18: 10 mg via ORAL
  Filled 2014-04-18: qty 2

## 2014-04-18 MED ORDER — OXYCODONE-ACETAMINOPHEN 5-325 MG PO TABS: 1.0000 | ORAL_TABLET | ORAL | Status: DC | PRN

## 2014-04-18 MED ORDER — AMLODIPINE BESYLATE 10 MG PO TABS: 10.0000 mg | ORAL_TABLET | Freq: Every day | ORAL | Status: DC

## 2014-04-18 NOTE — Care Management Note (Signed)
    Page 1 of 1   04/18/2014     2:16:37 PM CARE MANAGEMENT NOTE 04/18/2014  Patient:  Katrina [name], Katrina [name]   Account Number:  [id number]  Date Initiated:  04/18/2014  Documentation initiated by:  Theophilus Kinds  Subjective/Objective Assessment:   Pt admitted from home with CP. Pt lives with her daughter and will return home at discharge. Pt is independent with ADL's. Pt has a cane and walker for home use. Pt receives PCP care at the [facility].     Action/Plan:   No CM needs noted.   Anticipated DC Date:  04/18/2014   Anticipated DC Plan:  [place]  CM consult      Choice offered to / List presented to:             Status of service:  Completed, signed off Medicare Important Message given?   (If response is "NO", the following Medicare IM given date fields will be blank) Date Medicare IM given:   Medicare IM given by:   Date Additional Medicare IM given:   Additional Medicare IM given by:    Discharge Disposition:  HOME/SELF CARE  Per UR Regulation:    If discussed at Long Length of Stay Meetings, dates discussed:    Comments:  04/18/14 [place], RN BSN CM

## 2014-04-18 NOTE — Plan of Care (Signed)
Problem: Phase I Progression Outcomes Goal: Hemodynamically stable Outcome: Not Progressing Patient non-compliant with med regimen for hypertension.  Problem: Phase II Progression Outcomes Goal: Hemodynamically stable Outcome: Not Progressing Patient non-compliant with meds for hypertension.  Problem: Phase III Progression Outcomes Goal: Hemodynamically stable Outcome: Not Progressing Patient non-compliant with meds for hypertension.  Problem: Discharge Progression Outcomes Goal: Hemodynamically stable Outcome: Not Progressing Patient non-compliant with meds and diet for hypertension.

## 2014-04-18 NOTE — Plan of Care (Signed)
Problem: Consults Goal: Skin Care Protocol Initiated - if Braden Score 18 or less If consults are not indicated, leave blank or document N/A  Outcome: Not Applicable Date Met:  02/27/29 Goal: Tobacco Cessation referral if indicated Outcome: Not Progressing Patient refuses  Problem: Phase I Progression Outcomes Goal: Voiding-avoid urinary catheter unless indicated Outcome: Completed/Met Date Met:  04/18/14

## 2014-04-18 NOTE — Progress Notes (Signed)
NURSING PROGRESS NOTE  Katrina [name] [id number] Discharge Data: 04/18/2014 5:50 PM Attending Provider: No att. providers found PCP:No PCP Per Patient   [name] to be D/C'd Home per MD order.    All IV's discontinued and monitored for bleeding.  All belongings returned to patient for patient to take home.  AVS summary and prescription reviewed with patient. Patient educated on hypertension, cardiac diet, and medication compliance.  Patient left floor ambulatory, escorted by NT.  Last Documented Vital Signs:  Blood pressure 210/110, pulse 70, temperature 98 F (36.7 C), temperature source Oral, resp. rate 20, height 5\' 7"  (1.702 m), weight 99.5 kg (219 lb 5.7 oz), SpO2 97 %.  [name] D

## 2014-04-18 NOTE — Telephone Encounter (Signed)
She requested to be D/C due to her being in the hospital and has other issues going on

## 2014-04-18 NOTE — Progress Notes (Signed)
Primary cardiologist: Dr. Kate Sable  Seen for followup: Uncontrolled hypertension, chest pain  Subjective:    Yesterday's events reviewed. Patient appears comfortable this morning, denies any active chest pain.  Objective:   Temp:  [98.2 F (36.8 C)-99 F (37.2 C)] 98.2 F (36.8 C) (11/12 0700) Pulse Rate:  [70-90] 70 (11/12 0700) Resp:  [20] 20 (11/12 0700) BP: (163-220)/(70-118) 163/70 mmHg (11/12 0700) SpO2:  [98 %-100 %] 98 % (11/12 0700) Weight:  [219 lb 5.7 oz (99.5 kg)] 219 lb 5.7 oz (99.5 kg) (11/12 0700) Last BM Date: 04/17/14  Filed Weights   04/16/14 0330 04/17/14 0546 04/18/14 0700  Weight: 216 lb (97.977 kg) 220 lb 12.8 oz (100.154 kg) 219 lb 5.7 oz (99.5 kg)    Intake/Output Summary (Last 24 hours) at 04/18/14 0834 Last data filed at 04/18/14 0700  Gross per 24 hour  Intake      0 ml  Output    550 ml  Net   -550 ml    Telemetry: Sinus rhythm.  Exam:  General: Appears comfortable.  Lungs: Clear, nonlabored.  Cardiac: RRR, no gallop.  Extremities: No pitting.   Lab Results:  Basic Metabolic Panel:  Recent Labs Lab 04/15/14 2315 04/17/14 0620  NA 142 143  K 3.5* 4.4  CL 105 106  CO2 24 24  GLUCOSE 117* 89  BUN 18 15  CREATININE 0.97 0.84  CALCIUM 9.5 9.9    CBC:  Recent Labs Lab 04/15/14 2315  WBC 10.3  HGB 13.4  HCT 40.3  MCV 89.6  PLT 252    Cardiac Enzymes:  Recent Labs Lab 04/16/14 0906 04/16/14 2126 04/18/14 0205  TROPONINI <0.30 <0.30 <0.30    BNP:  Recent Labs  09/22/13 2200  PROBNP 259.2*    ECG: Sinus rhythm, no acute ST segment changes.   Medications:   Scheduled Medications: . amLODipine  5 mg Oral Daily  . [START ON 04/21/2014] cloNIDine  0.3 mg Transdermal Weekly  . enoxaparin (LOVENOX) injection  40 mg Subcutaneous Daily  . gabapentin  600 mg Oral TID  . isosorbide mononitrate  60 mg Oral QPM  . labetalol  300 mg Oral BID  . levothyroxine  50 mcg Oral QHS  . simvastatin   20 mg Oral QPC supper  . triamterene-hydrochlorothiazide  1 tablet Oral Daily  . zolpidem  10 mg Oral QHS     PRN Medications: acetaminophen, diphenhydrAMINE, furosemide, gi cocktail, hydrALAZINE, morphine injection, ondansetron (ZOFRAN) IV, oxyCODONE-acetaminophen   Assessment:   1. Recurring chest pain symptoms in the setting of uncontrolled hypertension and hypertensive heart disease as well as previously documented moderate nonobstructive CAD. Cardiac markers are normal. She had an ischemic workup in April of this year that was reassuring. Could be that symptoms are related to demand ischemia and even component of microvascular angina. Blood pressure is still not well controlled.  2. Poorly controlled hypertension. She has reportedly been on a variety of different medications over the years. Current regimen includes clonidine patch, Imdur, labetalol, and Maxide. Norvasc was added yesterday and will be up titrated further today. I discussed her diet, salt restriction, also exercise and weight loss. She actually laughed and said, "it's not going to happen."  3. Hyperlipidemia, on statin therapy.   Plan/Discussion:    Continue current regimen, increase Norvasc to 10 mg daily. I do not anticipate further cardiac testing as an inpatient. She does not to appear to be particularly engaged in her healthcare - I did discuss  with her importance of diet, salt restriction, exercise as tolerated, weight loss. She should be scheduled to followup with Dr. Bronson Ing after discharge.   Satira Sark, M.D., F.A.C.C.

## 2014-04-18 NOTE — Progress Notes (Signed)
Patient refused diuretic (Maxide) this morning. She states "it makes me go to the bathroom too much". Patient educated on how and why diuretic works. Patient eager to return home. Will continue to monitor.

## 2014-04-18 NOTE — Progress Notes (Signed)
Help patient complete her Advance Directive and placed a copy in her chart.

## 2014-04-18 NOTE — Progress Notes (Signed)
Patient complained of 10/10 chest pain. Given morphine, 2 L of oxygen, and STAT EKG. EKG showed NSR. MD made aware. Patient's BP is still elevated and she refuses Hydralazine. Will continue to monitor. Patient is stable.

## 2014-04-18 NOTE — Progress Notes (Signed)
PROGRESS NOTE  Katrina [name] [id number]:[id number] DOB: 13-Apr-1961 DOA: 04/15/2014 PCP: No PCP Per Patient Primary cardiologist: Dr. Kate Sable  Summary: 53 year old woman with history of malignant hypertension, nonobstructive coronary disease with normal stress test 09/2013, COPD presented with substernal chest pain. Admitted for chest pain.  Assessment/Plan: 1. Chest pain in the setting of malignant hypertension. Resolved. No evidence of ACS. Cardiology has recommended no further inpatient evaluation. 2. Malignant hypertension. Asymptomatic but remains poorly controlled. Norvasc increased today. Patient reports blood pressure long-standing is elevated. 3. Diabetes mellitus, diet controlled. Remained stable. 4. Nonobstructive coronary artery disease. 5. Grade 2 diastolic dysfunction with left ventricular hypertrophy. 6. Tobacco dependence. Recommend cessation. 7. COPD. 8. Obesity.   Discussed with cardiology Dr. Domenic Polite. Patient is noncompliant as documented, asymptomatic with labile hypertension. She has been refusing some of her medications. No further benefit to ongoing hospitalization anticipated. She has been counseled on diet and need for medications but has expressed open noncompliance with these recommendations. Therefore discharge home with outpatient follow-up. She understands risk of stroke as well as risk to her heart.  She reports she cannot take aspirin secondary to history of peptic ulcer disease.  Murray Hodgkins, MD  [facility]  Pager [phone] If 7PM-7AM, please contact night-coverage at [email or web address], password [facility] 04/18/2014, 4:44 PM  LOS: 3 days   Consultants:  cardiology  Procedures:    Antibiotics:    HPI/Subjective: Patient refusing heart healthy diet, eating fast food. Blood pressure continues to be elevated she is refusing some medications, SCDs and Lovenox. Continues to report a systolic blood pressure of 200 is unremarkable for her. She  did have an episode of chest pain last night EKG was done which was unremarkable. Cardiology is recommended increasing Norvasc to 10 mg. No further evaluation warranted as an inpatient. Cleared for discharge. Patient refused Maxide this morning.  At time of examination this afternoon she felt well, denying complaints, denied pain, want to go home.  Objective: Filed Vitals:   04/17/14 2006 04/18/14 0006 04/18/14 0700 04/18/14 1300  BP:  179/102 163/70 210/110  Pulse:  90 70 70  Temp: 99 F (37.2 C)  98.2 F (36.8 C) 98 F (36.7 C)  TempSrc: Oral  Oral Oral  Resp:  20 20 20   Height:      Weight:   99.5 kg (219 lb 5.7 oz)   SpO2:  99% 98% 97%    Intake/Output Summary (Last 24 hours) at 04/18/14 1644 Last data filed at 04/18/14 1200  Gross per 24 hour  Intake    480 ml  Output    300 ml  Net    180 ml     Filed Weights   04/16/14 0330 04/17/14 0546 04/18/14 0700  Weight: 97.977 kg (216 lb) 100.154 kg (220 lb 12.8 oz) 99.5 kg (219 lb 5.7 oz)    Exam:     Afebrile, vital signs stable. Labile hypertension persists. No hypoxia.  Appears calm, comfortable.  Cardiovascular regular rate and rhythm. No murmur, rub or gallop.  Regular rate and rhythm. No murmur, rub or gallop.  Data Reviewed:  Troponin negative.  EKG normal sinus rhythm, no acute changes.  Scheduled Meds: . amLODipine  10 mg Oral Daily  . [START ON 04/21/2014] cloNIDine  0.3 mg Transdermal Weekly  . enoxaparin (LOVENOX) injection  40 mg Subcutaneous Daily  . gabapentin  600 mg Oral TID  . isosorbide mononitrate  60 mg Oral QPM  . labetalol  300 mg Oral BID  . levothyroxine  50 mcg Oral QHS  . simvastatin  20 mg Oral QPC supper  . triamterene-hydrochlorothiazide  1 tablet Oral Daily  . zolpidem  10 mg Oral QHS   Continuous Infusions:   Principal Problem:   Chest pain Active Problems:   COPD (chronic obstructive pulmonary disease)   HTN (hypertension)   Tobacco abuse   Diastolic dysfunction    CAD (coronary artery disease)   Hypokalemia   Malignant hypertension

## 2014-04-18 NOTE — Progress Notes (Signed)
Patient's BP 210/149mmHg. Patient refused PRN hydralazine. Will continue to monitor.

## 2014-04-18 NOTE — Evaluation (Addendum)
Received call about pt w/ chest pain. Has been a recurrent issue in setting of malignant HTN. Refused PM hydralazine per nursing. BP now 179/102. No change in CP morphology. Noted normal cardiac workup April this year per cards note. EKG w/o ischemic changes x1. Asked for trop x1. (trops negative thus far)Suspect sxs are related to symptomatic malignant HTN. Needs medication compliance. F/u on trop. Otherwise cont current regimen.

## 2014-04-18 NOTE — Discharge Summary (Signed)
Physician Discharge Summary  Katrina [name] GQQ:[id number] DOB: 05/12/1961 DOA: 04/15/2014  PCP: No PCP Per Patient  Admit date: 04/15/2014 Discharge date: 04/18/2014  Recommendations for Outpatient Follow-up:  1. Chest pain in the setting of malignant hypertension and frank noncompliance. Outpatient cardiology follow-up recommended. 2. Malignant hypertension poorly controlled secondary to phrenic noncompliance.   Follow-up Information    Follow up with [facility]. Schedule an appointment as soon as possible for a visit in 2 weeks.   Contact information:   [place] [address] [phone]       Follow up with Kate Sable A, MD. Schedule an appointment as soon as possible for a visit in 2 weeks.   Specialty:  Cardiology   Contact information:   [place] [address] [phone]      Discharge Diagnoses:  1. Chest pain 2. Malignant hypertension 3. Diabetes mellitus diet-controlled 4. Nonobstructive coronary artery disease 5. Grade 2 diastolic dysfunction 6. Tobacco dependence  Discharge Condition: stable Disposition: home  Diet recommendation: heart healthy low-salt  Filed Weights   04/16/14 0330 04/17/14 0546 04/18/14 0700  Weight: 97.977 kg (216 lb) 100.154 kg (220 lb 12.8 oz) 99.5 kg (219 lb 5.7 oz)    History of present illness:  53 year old woman with history of malignant hypertension, nonobstructive coronary disease with normal stress test 09/2013, COPD presented with substernal chest pain. Admitted for chest pain.  Hospital Course:  Katrina [name] ruled out for ACS. She was seen by cardiology with recommendations for improvement in blood pressure control. She continued to have intermittent chest pain which was thought to be possibly related to her hypertension or demand ischemia. She expressed frank noncompliance with diet recommendations as well as medications. Given ongoing noncompliance cardiology as  recommended no further evaluation, case was discussed with Dr. Domenic Polite who recommended discharge home. I discussed diet recommendations as well as medication recommendations with her she is not much interested.given frank noncompliance, there was not felt to be any further benefit to hospitalization or changes to her medications given noncompliance.  1. Chest pain in the setting of malignant hypertension. Resolved. No evidence of ACS. Cardiology has recommended no further inpatient evaluation. 2. Malignant hypertension. Asymptomatic but remains poorly controlled. Norvasc increased today. Patient reports blood pressure is chronically elevated. 3. Diabetes mellitus, diet controlled. Remained stable. 4. Nonobstructive coronary artery disease. 5. Grade 2 diastolic dysfunction with left ventricular hypertrophy. 6. Tobacco dependence. Recommend cessation. 7. COPD. 8. Obesity.   Discussed with cardiology Dr. Domenic Polite. Patient is noncompliant as documented, asymptomatic with labile hypertension. She has been refusing some of her medications. No further benefit to ongoing hospitalization anticipated. She has been counseled on diet and need for medications but has expressed open noncompliance with these recommendations. Therefore discharge home with outpatient follow-up. She understands risk of stroke as well as risk to her heart.  She reports she cannot take aspirin secondary to history of peptic ulcer disease. She does not take Azilsartan  Consultants:  cardiology  Procedures: none  Discharge Instructions  Discharge Instructions    Activity as tolerated - No restrictions    Complete by:  As directed      Diet - low sodium heart healthy    Complete by:  As directed      Discharge instructions    Complete by:  As directed   Call your physician or seek immediate medical attention for recurrent chest pain, shortness of breath or worsening of condition.  Discharge Medication List as  of 04/18/2014  5:31 PM    START taking these medications   Details  amLODipine (NORVASC) 10 MG tablet Take 1 tablet (10 mg total) by mouth daily., Starting 04/18/2014, Until Discontinued, Normal      CONTINUE these medications which have NOT CHANGED   Details  cloNIDine (CATAPRES - DOSED IN MG/24 HR) 0.3 mg/24hr patch Place 1 patch (0.3 mg total) onto the skin once a week. Sundays, Starting 03/20/2014, Until Discontinued, No Print    dexlansoprazole (DEXILANT) 60 MG capsule Take 1 capsule (60 mg total) by mouth every evening., Starting 03/20/2014, Until Discontinued, Print    EPINEPHrine (EPI-PEN) 0.3 mg/0.3 mL DEVI Inject 0.3 mg into the muscle once., Historical Med    furosemide (LASIX) 20 MG tablet Take 1 tablet (20 mg total) by mouth daily as needed for fluid., Starting 03/20/2014, Until Discontinued, Print    gabapentin (NEURONTIN) 600 MG tablet Take 1 tablet (600 mg total) by mouth 3 (three) times daily., Starting 03/20/2014, Until Discontinued, Print    isosorbide mononitrate (IMDUR) 60 MG 24 hr tablet Take 1 tablet (60 mg total) by mouth every evening., Starting 03/20/2014, Until Discontinued, Print    labetalol (NORMODYNE) 300 MG tablet Take 1 tablet (300 mg total) by mouth 2 (two) times daily., Starting 03/20/2014, Until Discontinued, Print    levothyroxine (SYNTHROID, LEVOTHROID) 50 MCG tablet Take 1 tablet (50 mcg total) by mouth at bedtime., Starting 03/20/2014, Until Discontinued, Print    oxyCODONE-acetaminophen (PERCOCET/ROXICET) 5-325 MG per tablet Take 1 tablet by mouth every 4 (four) hours as needed for severe pain (for pain)., Starting 03/20/2014, Until Discontinued, Print    rOPINIRole (REQUIP) 3 MG tablet Take 1 tablet (3 mg total) by mouth at bedtime., Starting 03/20/2014, Until Discontinued, Print    simvastatin (ZOCOR) 20 MG tablet Take 1 tablet (20 mg total) by mouth daily., Starting 04/10/2014, Until Discontinued, Normal    triamterene-hydrochlorothiazide  (DYAZIDE) 37.5-25 MG per capsule Take 1 each (1 capsule total) by mouth every morning., Starting 03/20/2014, Until Discontinued, Print    zolpidem (AMBIEN) 10 MG tablet Take 1 tablet (10 mg total) by mouth at bedtime., Starting 03/20/2014, Until Discontinued, Print      STOP taking these medications     Azilsartan Medoxomil 80 MG TABS      acetaminophen-codeine (TYLENOL #3) 300-30 MG per tablet        Allergies  Allergen Reactions  . Penicillins Anaphylaxis  . Pineapple Rash  . Strawberry Rash  . Poultry Meal     Allergic to [place]    The results of significant diagnostics from this hospitalization (including imaging, microbiology, ancillary and laboratory) are listed below for reference.    Significant Diagnostic Studies: Dg Chest Port 1 View  04/15/2014   CLINICAL DATA:  Chest pain radiating to the left shoulder.  EXAM: PORTABLE CHEST - 1 VIEW  COMPARISON:  PA and lateral chest 11/22/2011 and 03/19/2014.  FINDINGS: The lungs are clear. No pneumothorax or pleural effusion. Heart size is normal.  IMPRESSION: No acute disease.   Electronically Signed   By: Inge Rise M.D.   On: 04/15/2014 23:51    Labs: Basic Metabolic Panel:  Recent Labs Lab 04/15/14 2315 04/17/14 0620  NA 142 143  K 3.5* 4.4  CL 105 106  CO2 24 24  GLUCOSE 117* 89  BUN 18 15  CREATININE 0.97 0.84  CALCIUM 9.5 9.9   CBC:  Recent Labs Lab 04/15/14 2315  WBC 10.3  NEUTROABS  3.4  HGB 13.4  HCT 40.3  MCV 89.6  PLT 252   Cardiac Enzymes:  Recent Labs Lab 04/15/14 2315 04/16/14 0341 04/16/14 0906 04/16/14 2126 04/18/14 0205  TROPONINI <0.30 <0.30 <0.30 <0.30 <0.30    Principal Problem:   Chest pain Active Problems:   COPD (chronic obstructive pulmonary disease)   HTN (hypertension)   Tobacco abuse   Diastolic dysfunction   CAD (coronary artery disease)   Hypokalemia   Malignant hypertension   Time coordinating discharge: 25 minutes  Signed:  Murray Hodgkins,  MD [facility] 04/18/2014, 8:54 PM

## 2014-04-25 ENCOUNTER — Ambulatory Visit (HOSPITAL_COMMUNITY): Payer: Medicare Other

## 2014-04-26 ENCOUNTER — Ambulatory Visit (HOSPITAL_COMMUNITY): Payer: Medicare Other

## 2014-04-26 ENCOUNTER — Ambulatory Visit (HOSPITAL_COMMUNITY): Payer: Medicare Other | Admitting: Physical Therapy

## 2014-04-29 ENCOUNTER — Ambulatory Visit (HOSPITAL_COMMUNITY): Payer: Medicare Other

## 2014-05-01 ENCOUNTER — Ambulatory Visit (HOSPITAL_COMMUNITY): Payer: Medicare Other

## 2014-05-01 ENCOUNTER — Ambulatory Visit (HOSPITAL_COMMUNITY): Payer: Medicare Other | Admitting: Specialist

## 2014-05-06 ENCOUNTER — Ambulatory Visit (HOSPITAL_COMMUNITY): Payer: Medicare Other

## 2014-05-06 ENCOUNTER — Ambulatory Visit (HOSPITAL_COMMUNITY): Payer: Medicare Other | Admitting: Physical Therapy

## 2014-05-08 ENCOUNTER — Ambulatory Visit (HOSPITAL_COMMUNITY): Payer: Medicare Other | Admitting: Physical Therapy

## 2014-05-08 ENCOUNTER — Ambulatory Visit (HOSPITAL_COMMUNITY): Payer: Medicare Other

## 2014-05-09 ENCOUNTER — Ambulatory Visit: Payer: Medicare Other | Admitting: Cardiovascular Disease

## 2014-05-09 ENCOUNTER — Encounter (HOSPITAL_COMMUNITY): Payer: Self-pay | Admitting: Physical Therapy

## 2014-05-09 NOTE — Therapy (Signed)
[facility] [address] [place], [place], [address] Phone: [phone]   Fax:  [phone]  Patient Details  Name: Katrina [name] MRN: [id number] Date of Birth: Nov 01, 1960  Encounter Date: 05/09/2014  PHYSICAL THERAPY DISCHARGE SUMMARY  Visits from Start of Care:1  Patient discharged due to hospitalization  Current functional level related to goals / functional outcomes: PT Short Term Goals Time to Complete Short Term Goals: 4 weeks PT Short Term Goal 1: Patient will display a negative thomas nd negative Ober's test to improve stride length of gait PT Short Term Goal 2: Patient will isplays increased ankle dorsiflexion strength of >10 degrees to decrease early heel rise durign gait.  PT Short Term Goal 3: Patient ill demosntrate increased Lt hip abduction of 3+/5 to decrease severity of trendelenberg gait PT Short Term Goal 4: Patient will demonstrate Lt 3/5 MMT strength of hip flexors to be able to lift Lt leg while performing stairs PT Short Term Goal 5: Patient will demosntrate increased glut strength of 3+/5 to be able to perform sit to stand without UE assist PT Long Term Goals Time to Complete Long Term Goals: 8 weeks PT Long Term Goal 1: Patient ill demosntrate increased Lt hip abduction of 4/5 to decrease severity of trendelenberg gait PT Long Term Goal 2: Patient will demosntrate increased glut max strength of 4/5 to be able to perform 5x sit to stand in <15 seconds Long Term Goal 3: Patient will be able to ambulate >1242f without UE assist during performance of 656m walk test.  Long Term Goal 4: Patient will dmeosntrate improved Berg balance score of >52/56 to indicate improved balance and patient not at high risk of falls PT Long Term Goal 5: patient will be able to ambualte up and down a flight of stairs without UE assist   All goals not met   Plan: Patient agrees to discharge.  Patient goals were not met. Patient is being discharged  due to                                                   hospitalization  ?????       CaDevona KonigT DPT [phone]

## 2014-05-21 DIAGNOSIS — R5383 Other fatigue: Secondary | ICD-10-CM | POA: Diagnosis not present

## 2014-05-21 DIAGNOSIS — G4459 Other complicated headache syndrome: Secondary | ICD-10-CM | POA: Diagnosis not present

## 2014-05-21 DIAGNOSIS — R0683 Snoring: Secondary | ICD-10-CM | POA: Diagnosis not present

## 2014-05-21 DIAGNOSIS — F5105 Insomnia due to other mental disorder: Secondary | ICD-10-CM | POA: Diagnosis not present

## 2014-05-29 ENCOUNTER — Encounter (HOSPITAL_COMMUNITY): Payer: Self-pay | Admitting: *Deleted

## 2014-05-29 ENCOUNTER — Emergency Department (HOSPITAL_COMMUNITY): Payer: Medicare Other

## 2014-05-29 ENCOUNTER — Observation Stay (HOSPITAL_COMMUNITY)
Admission: EM | Admit: 2014-05-29 | Discharge: 2014-05-30 | Disposition: A | Discharge status: Home / Self Care | Payer: Medicare Other | Attending: Family Medicine | Admitting: Family Medicine

## 2014-05-29 DIAGNOSIS — R4781 Slurred speech: Secondary | ICD-10-CM | POA: Insufficient documentation

## 2014-05-29 DIAGNOSIS — I10 Essential (primary) hypertension: Secondary | ICD-10-CM | POA: Diagnosis not present

## 2014-05-29 DIAGNOSIS — E039 Hypothyroidism, unspecified: Secondary | ICD-10-CM | POA: Diagnosis not present

## 2014-05-29 DIAGNOSIS — E785 Hyperlipidemia, unspecified: Secondary | ICD-10-CM | POA: Insufficient documentation

## 2014-05-29 DIAGNOSIS — R079 Chest pain, unspecified: Secondary | ICD-10-CM | POA: Diagnosis not present

## 2014-05-29 DIAGNOSIS — R531 Weakness: Secondary | ICD-10-CM

## 2014-05-29 DIAGNOSIS — Z880 Allergy status to penicillin: Secondary | ICD-10-CM | POA: Insufficient documentation

## 2014-05-29 DIAGNOSIS — Z720 Tobacco use: Secondary | ICD-10-CM | POA: Insufficient documentation

## 2014-05-29 DIAGNOSIS — K219 Gastro-esophageal reflux disease without esophagitis: Secondary | ICD-10-CM | POA: Insufficient documentation

## 2014-05-29 DIAGNOSIS — I639 Cerebral infarction, unspecified: Secondary | ICD-10-CM | POA: Diagnosis not present

## 2014-05-29 DIAGNOSIS — R61 Generalized hyperhidrosis: Secondary | ICD-10-CM | POA: Insufficient documentation

## 2014-05-29 DIAGNOSIS — M6289 Other specified disorders of muscle: Secondary | ICD-10-CM | POA: Diagnosis not present

## 2014-05-29 DIAGNOSIS — J449 Chronic obstructive pulmonary disease, unspecified: Secondary | ICD-10-CM | POA: Diagnosis present

## 2014-05-29 DIAGNOSIS — E119 Type 2 diabetes mellitus without complications: Secondary | ICD-10-CM | POA: Diagnosis not present

## 2014-05-29 DIAGNOSIS — K5790 Diverticulosis of intestine, part unspecified, without perforation or abscess without bleeding: Secondary | ICD-10-CM | POA: Insufficient documentation

## 2014-05-29 DIAGNOSIS — F319 Bipolar disorder, unspecified: Secondary | ICD-10-CM | POA: Insufficient documentation

## 2014-05-29 DIAGNOSIS — R0602 Shortness of breath: Secondary | ICD-10-CM | POA: Diagnosis present

## 2014-05-29 DIAGNOSIS — I2510 Atherosclerotic heart disease of native coronary artery without angina pectoris: Secondary | ICD-10-CM | POA: Diagnosis not present

## 2014-05-29 DIAGNOSIS — J441 Chronic obstructive pulmonary disease with (acute) exacerbation: Secondary | ICD-10-CM | POA: Insufficient documentation

## 2014-05-29 DIAGNOSIS — Z79899 Other long term (current) drug therapy: Secondary | ICD-10-CM | POA: Insufficient documentation

## 2014-05-29 DIAGNOSIS — R110 Nausea: Secondary | ICD-10-CM | POA: Diagnosis not present

## 2014-05-29 LAB — COMPREHENSIVE METABOLIC PANEL
ALK PHOS: 78 U/L (ref 39–117)
ALT: 17 U/L (ref 0–35)
AST: 21 U/L (ref 0–37)
Albumin: 3.7 g/dL (ref 3.5–5.2)
Anion gap: 7 (ref 5–15)
BUN: 23 mg/dL (ref 6–23)
CO2: 24 mmol/L (ref 19–32)
Calcium: 8.9 mg/dL (ref 8.4–10.5)
Chloride: 111 mEq/L (ref 96–112)
Creatinine, Ser: 0.96 mg/dL (ref 0.50–1.10)
GFR calc non Af Amer: 66 mL/min — ABNORMAL LOW (ref >90)
GFR, EST AFRICAN AMERICAN: 77 mL/min — AB (ref >90)
GLUCOSE: 95 mg/dL (ref 70–99)
POTASSIUM: 3.5 mmol/L (ref 3.5–5.1)
Sodium: 142 mmol/L (ref 135–145)
TOTAL PROTEIN: 7.1 g/dL (ref 6.0–8.3)
Total Bilirubin: 0.3 mg/dL (ref 0.3–1.2)

## 2014-05-29 LAB — URINE MICROSCOPIC-ADD ON

## 2014-05-29 LAB — CBC WITH DIFFERENTIAL/PLATELET
Basophils Absolute: 0.1 10*3/uL (ref 0.0–0.1)
Basophils Relative: 1 % (ref 0–1)
Eosinophils Absolute: 0.2 10*3/uL (ref 0.0–0.7)
Eosinophils Relative: 2 % (ref 0–5)
HCT: 38.5 % (ref 36.0–46.0)
HEMOGLOBIN: 12.5 g/dL (ref 12.0–15.0)
LYMPHS ABS: 5.4 10*3/uL — AB (ref 0.7–4.0)
Lymphocytes Relative: 51 % — ABNORMAL HIGH (ref 12–46)
MCH: 29.3 pg (ref 26.0–34.0)
MCHC: 32.5 g/dL (ref 30.0–36.0)
MCV: 90.4 fL (ref 78.0–100.0)
MONOS PCT: 7 % (ref 3–12)
Monocytes Absolute: 0.7 10*3/uL (ref 0.1–1.0)
Neutro Abs: 4 10*3/uL (ref 1.7–7.7)
Neutrophils Relative %: 39 % — ABNORMAL LOW (ref 43–77)
Platelets: 243 10*3/uL (ref 150–400)
RBC: 4.26 MIL/uL (ref 3.87–5.11)
RDW: 14.3 % (ref 11.5–15.5)
WBC: 10.4 10*3/uL (ref 4.0–10.5)

## 2014-05-29 LAB — I-STAT CHEM 8, ED
BUN: 21 mg/dL (ref 6–23)
CREATININE: 1 mg/dL (ref 0.50–1.10)
Calcium, Ion: 1.15 mmol/L (ref 1.12–1.23)
Chloride: 110 mEq/L (ref 96–112)
GLUCOSE: 94 mg/dL (ref 70–99)
HCT: 38 % (ref 36.0–46.0)
Hemoglobin: 12.9 g/dL (ref 12.0–15.0)
POTASSIUM: 3.5 mmol/L (ref 3.5–5.1)
Sodium: 144 mmol/L (ref 135–145)
TCO2: 19 mmol/L (ref 0–100)

## 2014-05-29 LAB — URINALYSIS, ROUTINE W REFLEX MICROSCOPIC
BILIRUBIN URINE: NEGATIVE
GLUCOSE, UA: NEGATIVE mg/dL
Ketones, ur: NEGATIVE mg/dL
Leukocytes, UA: NEGATIVE
Nitrite: NEGATIVE
PH: 6 (ref 5.0–8.0)
SPECIFIC GRAVITY, URINE: 1.025 (ref 1.005–1.030)
Urobilinogen, UA: 0.2 mg/dL (ref 0.0–1.0)

## 2014-05-29 LAB — RAPID URINE DRUG SCREEN, HOSP PERFORMED
Amphetamines: NOT DETECTED
Barbiturates: NOT DETECTED
Benzodiazepines: NOT DETECTED
COCAINE: NOT DETECTED
OPIATES: NOT DETECTED
TETRAHYDROCANNABINOL: NOT DETECTED

## 2014-05-29 LAB — TROPONIN I: Troponin I: <0.03 ng/mL (ref <0.031)

## 2014-05-29 LAB — PROTIME-INR
INR: 1.08 (ref 0.00–1.49)
PROTHROMBIN TIME: 14.2 s (ref 11.6–15.2)

## 2014-05-29 LAB — APTT: APTT: 29 s (ref 24–37)

## 2014-05-29 LAB — CBG MONITORING, ED: GLUCOSE-CAPILLARY: 100 mg/dL — AB (ref 70–99)

## 2014-05-29 LAB — I-STAT TROPONIN, ED: Troponin i, poc: 0 ng/mL (ref 0.00–0.08)

## 2014-05-29 LAB — ETHANOL: Alcohol, Ethyl (B): <5 mg/dL (ref 0–9)

## 2014-05-29 LAB — D-DIMER, QUANTITATIVE: D-Dimer, Quant: 0.38 ug/mL-FEU (ref 0.00–0.48)

## 2014-05-29 MED ORDER — LEVOTHYROXINE SODIUM 50 MCG PO TABS
50.0000 ug | ORAL_TABLET | Freq: Every day | ORAL | Status: DC
  Filled 2014-05-29: qty 1

## 2014-05-29 MED ORDER — SODIUM CHLORIDE 0.9 % IV SOLN
INTRAVENOUS | Status: DC
  Administered 2014-05-30 (×2): via INTRAVENOUS

## 2014-05-29 MED ORDER — HEPARIN SODIUM (PORCINE) 5000 UNIT/ML IJ SOLN
5000.0000 [IU] | Freq: Three times a day (TID) | INTRAMUSCULAR | Status: DC
  Filled 2014-05-29: qty 1

## 2014-05-29 MED ORDER — CLONIDINE HCL 0.3 MG/24HR TD PTWK
0.3000 mg | MEDICATED_PATCH | TRANSDERMAL | Status: DC
  Filled 2014-05-29: qty 1

## 2014-05-29 MED ORDER — TRIAMTERENE-HCTZ 37.5-25 MG PO CAPS
1.0000 | ORAL_CAPSULE | Freq: Every morning | ORAL | Status: DC
  Filled 2014-05-29 (×5): qty 1

## 2014-05-29 MED ORDER — ZOLPIDEM TARTRATE 5 MG PO TABS: 10.0000 mg | ORAL_TABLET | Freq: Every day | ORAL | Status: DC

## 2014-05-29 MED ORDER — ISOSORBIDE MONONITRATE ER 60 MG PO TB24
60.0000 mg | ORAL_TABLET | Freq: Every evening | ORAL | Status: DC
  Filled 2014-05-29: qty 1

## 2014-05-29 MED ORDER — ASPIRIN EC 81 MG PO TBEC
81.0000 mg | DELAYED_RELEASE_TABLET | Freq: Every day | ORAL | Status: DC
  Filled 2014-05-29: qty 1

## 2014-05-29 MED ORDER — ROPINIROLE HCL 1 MG PO TABS
3.0000 mg | ORAL_TABLET | Freq: Every day | ORAL | Status: DC
  Administered 2014-05-30: 3 mg via ORAL
  Filled 2014-05-29 (×5): qty 3

## 2014-05-29 MED ORDER — LABETALOL HCL 200 MG PO TABS
300.0000 mg | ORAL_TABLET | Freq: Two times a day (BID) | ORAL | Status: DC
  Administered 2014-05-30: 300 mg via ORAL
  Filled 2014-05-29 (×5): qty 1
  Filled 2014-05-29: qty 2
  Filled 2014-05-29 (×5): qty 1

## 2014-05-29 MED ORDER — OXYCODONE-ACETAMINOPHEN 5-325 MG PO TABS: 1.0000 | ORAL_TABLET | ORAL | Status: DC | PRN

## 2014-05-29 MED ORDER — PANTOPRAZOLE SODIUM 40 MG PO TBEC
80.0000 mg | DELAYED_RELEASE_TABLET | Freq: Every day | ORAL | Status: DC
  Administered 2014-05-30 (×2): 80 mg via ORAL
  Filled 2014-05-29 (×2): qty 2

## 2014-05-29 MED ORDER — ZOLPIDEM TARTRATE 5 MG PO TABS
5.0000 mg | ORAL_TABLET | Freq: Every day | ORAL | Status: DC
  Administered 2014-05-30: 5 mg via ORAL
  Filled 2014-05-29: qty 1

## 2014-05-29 MED ORDER — CARISOPRODOL 350 MG PO TABS
350.0000 mg | ORAL_TABLET | Freq: Four times a day (QID) | ORAL | Status: DC
  Administered 2014-05-30 (×2): 350 mg via ORAL
  Filled 2014-05-29 (×2): qty 1

## 2014-05-29 MED ORDER — SIMVASTATIN 20 MG PO TABS
20.0000 mg | ORAL_TABLET | Freq: Every day | ORAL | Status: DC
  Administered 2014-05-30: 20 mg via ORAL
  Filled 2014-05-29 (×2): qty 1

## 2014-05-29 MED ORDER — SODIUM CHLORIDE 0.9 % IJ SOLN
3.0000 mL | Freq: Two times a day (BID) | INTRAMUSCULAR | Status: DC
  Administered 2014-05-30: 3 mL via INTRAVENOUS

## 2014-05-29 MED ORDER — GABAPENTIN 300 MG PO CAPS
600.0000 mg | ORAL_CAPSULE | Freq: Three times a day (TID) | ORAL | Status: DC
  Administered 2014-05-30 (×2): 600 mg via ORAL
  Filled 2014-05-29 (×2): qty 2

## 2014-05-29 MED ORDER — AMLODIPINE BESYLATE 5 MG PO TABS
10.0000 mg | ORAL_TABLET | Freq: Every day | ORAL | Status: DC
  Administered 2014-05-30: 10 mg via ORAL
  Filled 2014-05-29 (×2): qty 2

## 2014-05-29 NOTE — ED Notes (Signed)
Pt with left sided CP for 2 days, nausea and vomiting started just on arrival here, + SOB

## 2014-05-29 NOTE — ED Provider Notes (Signed)
CSN: [id number]     Arrival date & time 05/29/14  1901 History  This chart was scribed for Katrina Essex, MD by Einar Pheasant, ED Scribe. This patient was seen in room [facility]12/[facility]12 and the patient's care was started at 7:19 PM.     Chief Complaint  Patient presents with  . Code Stroke   Level 5 Caveat-- pt is unable to recall an accurate history.   The history is provided by the patient and a relative. History limited by: unable to give accurate hx. No language interpreter was used.   HPI Comments: Katrina [name] is a 53 y.o. female with a hx of COPD, hyperlipidemia, DM2, and CAD presents to the Emergency Department complaining of gradual onset intermittent left sided chest pain that started approximately 2 days ago. Pt states that the chest pain started while she was at rest. She endorses associated nausea and emesis upon arrival at the ED. Pt is also complaining of SOB. Pt states that the chest pain last about 15 minutes and then goes away on its own. She states that the pain radiates up to her neck. Pt states that her last stress test was in May/April of this year. She states that the chest pain that she is experiencing today is worse than the chest pain she experienced in April. Endorses taking all medications as prescribed. Pain is not made better or worse by any specific activity of body position. She also endorses left sided facial drooping after lunch (but is unsure, states that it was sometime this afternoon) as well as an increase in left sided weakness. Daughter states that mother was normal at 5:50, which is approximately 1.5 hours ago. She also endorses a slurred speech. Pt endorses a prior hx of a mild stroke. Denies any fever, chills, weakness, dizziness, light-headedness, HA, abdominal pain, numbness, taking blood thinners, recent travels, or diarrhea.   Past Medical History  Diagnosis Date  . Type 2 diabetes mellitus   . Essential hypertension   . Hyperlipidemia   . COPD  (chronic obstructive pulmonary disease)   . GERD (gastroesophageal reflux disease)   . CAD (coronary artery disease)     Moderate nonobstructive 2012-2013, [place]  . Hypothyroid   . Lung nodules   . Diverticulosis   . Bipolar affective   . Left-sided weakness 03/20/2014    MRI brain negative for stroke.   Past Surgical History  Procedure Laterality Date  . Back surgery    . Abdominal hysterectomy    . Appendectomy    . Tonsillectomy    . Ankle surgery    . Cholecystectomy    . Nose surgery     Family History  Problem Relation Age of Onset  . Coronary artery disease Father 57  . Emphysema Father    History  Substance Use Topics  . Smoking status: Current Every Day Smoker -- 1.00 packs/day for 40 years    Types: Cigarettes  . Smokeless tobacco: Never Used  . Alcohol Use: No   OB History    No data available     Review of Systems  Unable to perform ROS: Other  A complete 10 system review of systems was obtained and all systems are negative except as noted in the HPI and PMH.     Allergies  Penicillins; Pineapple; Strawberry; Poultry meal; and Aspartame and phenylalanine  Home Medications   Prior to Admission medications   Medication Sig Start Date End Date Taking? Authorizing Provider  amLODipine (NORVASC) 10 MG  tablet Take 1 tablet (10 mg total) by mouth daily. 04/18/14  Yes Samuella Cota, MD  carisoprodol (SOMA) 350 MG tablet Take 350 mg by mouth 4 (four) times daily.  05/27/14  Yes Historical Provider, MD  cloNIDine (CATAPRES - DOSED IN MG/24 HR) 0.3 mg/24hr patch Place 1 patch (0.3 mg total) onto the skin once a week. Sundays 03/20/14  Yes Rexene Alberts, MD  dexlansoprazole (DEXILANT) 60 MG capsule Take 1 capsule (60 mg total) by mouth every evening. 03/20/14  Yes Rexene Alberts, MD  EPINEPHrine (EPI-PEN) 0.3 mg/0.3 mL DEVI Inject 0.3 mg into the muscle once.   Yes Historical Provider, MD  gabapentin (NEURONTIN) 600 MG tablet Take 1 tablet (600 mg total)  by mouth 3 (three) times daily. 03/20/14  Yes Rexene Alberts, MD  isosorbide mononitrate (IMDUR) 60 MG 24 hr tablet Take 1 tablet (60 mg total) by mouth every evening. 03/20/14  Yes Rexene Alberts, MD  labetalol (NORMODYNE) 300 MG tablet Take 1 tablet (300 mg total) by mouth 2 (two) times daily. 03/20/14  Yes Rexene Alberts, MD  levothyroxine (SYNTHROID, LEVOTHROID) 50 MCG tablet Take 1 tablet (50 mcg total) by mouth at bedtime. 03/20/14  Yes Rexene Alberts, MD  oxyCODONE-acetaminophen (PERCOCET/ROXICET) 5-325 MG per tablet Take 1 tablet by mouth every 4 (four) hours as needed for severe pain (for pain). 03/20/14  Yes Rexene Alberts, MD  rOPINIRole (REQUIP) 3 MG tablet Take 1 tablet (3 mg total) by mouth at bedtime. 03/20/14  Yes Rexene Alberts, MD  simvastatin (ZOCOR) 20 MG tablet Take 1 tablet (20 mg total) by mouth daily. 04/10/14  Yes Herminio Commons, MD  triamterene-hydrochlorothiazide (DYAZIDE) 37.5-25 MG per capsule Take 1 each (1 capsule total) by mouth every morning. 03/20/14  Yes Rexene Alberts, MD  zolpidem (AMBIEN) 10 MG tablet Take 1 tablet (10 mg total) by mouth at bedtime. 03/20/14  Yes Rexene Alberts, MD  furosemide (LASIX) 20 MG tablet Take 1 tablet (20 mg total) by mouth daily as needed for fluid. 03/20/14   Rexene Alberts, MD   BP 190/85 mmHg  Pulse 67  Temp(Src) 98.1 F (36.7 C) (Oral)  Resp 20  Ht 5\' 7"  (1.702 m)  SpO2 93%   Physical Exam  Constitutional: She is oriented to person, place, and time. She appears well-developed and well-nourished. No distress.  HENT:  Head: Normocephalic and atraumatic.  Mouth/Throat: Oropharynx is clear and moist. No oropharyngeal exudate.  Eyes: Conjunctivae and EOM are normal. Pupils are equal, round, and reactive to light.  Neck: Normal range of motion. Neck supple.  No meningismus.  Cardiovascular: Normal rate, regular rhythm, normal heart sounds and intact distal pulses.   No murmur heard. Pulmonary/Chest: Effort normal and breath  sounds normal. No respiratory distress. She exhibits no tenderness.  Equal breath sounds. No chest wall tenderness noted.  Abdominal: Soft. There is no tenderness. There is no rebound and no guarding.  Musculoskeletal: Normal range of motion. She exhibits no edema or tenderness.  Neurological: She is alert and oriented to person, place, and time. No cranial nerve deficit. She exhibits normal muscle tone. Coordination normal.  Left sided weakness with left sided pronator drift. Left facial drooping noted. Slurred speech.  Skin: Skin is warm.  Psychiatric: She has a normal mood and affect. Her behavior is normal.  Nursing note and vitals reviewed.   ED Course  Procedures (including critical care time)  COORDINATION OF CARE: 7:39 PM-On call neurologist contacted.  Pt advised of plan for treatment and pt  agrees.  Labs Review Labs Reviewed  CBC WITH DIFFERENTIAL - Abnormal; Notable for the following:    Neutrophils Relative % 39 (*)    Lymphocytes Relative 51 (*)    Lymphs Abs 5.4 (*)    All other components within normal limits  COMPREHENSIVE METABOLIC PANEL - Abnormal; Notable for the following:    GFR calc non Af Amer 66 (*)    GFR calc Af Amer 77 (*)    All other components within normal limits  URINALYSIS, ROUTINE W REFLEX MICROSCOPIC - Abnormal; Notable for the following:    Hgb urine dipstick LARGE (*)    Protein, ur TRACE (*)    All other components within normal limits  URINE MICROSCOPIC-ADD ON - Abnormal; Notable for the following:    Squamous Epithelial / LPF MANY (*)    Bacteria, UA FEW (*)    All other components within normal limits  CBG MONITORING, ED - Abnormal; Notable for the following:    Glucose-Capillary 100 (*)    All other components within normal limits  TROPONIN I  ETHANOL  PROTIME-INR  APTT  URINE RAPID DRUG SCREEN (HOSP PERFORMED)  D-DIMER, QUANTITATIVE  TROPONIN I  COMPREHENSIVE METABOLIC PANEL  CBC WITH DIFFERENTIAL  TROPONIN I  TROPONIN I   I-STAT CHEM 8, ED  I-STAT TROPOININ, ED  I-STAT TROPOININ, ED  I-STAT CHEM 8, ED  I-STAT TROPOININ, ED    Imaging Review Ct Head Wo Contrast  05/29/2014   CLINICAL DATA:  Code stroke, chest pain for 2 days, shortness of breath, nausea and vomiting. LEFT-sided weakness for 1 hr.  EXAM: CT HEAD WITHOUT CONTRAST  TECHNIQUE: Contiguous axial images were obtained from the base of the skull through the vertex without intravenous contrast.  COMPARISON:  CT of the head March 18, 2014 and MRI of the head October 13 and 14, 2015.  FINDINGS: The ventricles and sulci are normal. No intraparenchymal hemorrhage, mass effect nor midline shift. No acute large vascular territory infarcts. Mild white matter changes are similar.  No abnormal extra-axial fluid collections. Basal cisterns are patent.  No skull fracture. The included ocular globes and orbital contents are non-suspicious. The mastoid aircells and included paranasal sinuses are well-aerated.  IMPRESSION: No acute intracranial process. If ongoing concern for acute ischemia, MRI of the brain with diffusion-weighted sequences would be more sensitive.  Mild nonspecific white matter changes were better characterized on the recent MRI of the brain.  Acute findings discussed with and reconfirmed by Dr.Amilio Zehnder on 05/29/2014 at 7:54 pm.   Electronically Signed   By: Elon Alas   On: 05/29/2014 19:56   Dg Chest Portable 1 View  05/29/2014   CLINICAL DATA:  Chest pain.  Hypertension.  COPD.  EXAM: PORTABLE CHEST - 1 VIEW  COMPARISON:  04/15/2014  FINDINGS: Vague density at the left lung base is thought to be due to soft tissues of the left chest wall rather than a left lower lobe opacity. There is no silhouetting of the hemidiaphragm, or cardiac border.  Accordingly, the lungs are thought to be clear. Cardiac and mediastinal margins appear normal.  IMPRESSION: 1.  No significant abnormality identified.   Electronically Signed   By: Sherryl Barters  M.D.   On: 05/29/2014 20:39     EKG Interpretation None      MDM   Final diagnoses:  Chest pain   presents with 3 day history of left-sided chest pain that comes and goes lasting several minutes at a time. Associated  with shortness of breath, diaphoresis and nausea. History of CAD without stents. Recent admission in November with myoview 09/2013 negative for inducible ischemia or significant wall motion abnormalities. EKG unchanged.  NSR.   Patient noted to have left-sided weakness on exam which she states happened just prior to arrival. Patient's daughter says she was last seen normal at 1750. Patient is uncertain when the weakness started.  Code stroke activated. Patient discussed with Dr. Janann Colonel of neurology. He defers to tele neurologist and does not favor tPA. Patient notably had a similar presentation in October with negative MRI and CTA.  Per teleneurologist, patient's findings are psychogenic. She does not recommend TPA. She does recommend MRI given patient's stroke risk factors. Troponin negative x1.  CT head negative.   Weakness has improved in the ED.  Given her stroke and CAD risk factors, will admit for chest pain rule out as well as CVA rule out. D/w Dr. Ernestina Patches.  I personally performed the services described in this documentation, which was scribed in my presence. The recorded information has been reviewed and is accurate.    Katrina Essex, MD 05/30/14 [phone]

## 2014-05-29 NOTE — H&P (Addendum)
Hospitalist Admission History and Physical  Patient name: Katrina [name] Medical record number: [id number] Date of birth: 10/24/1960 Age: 53 y.o. Gender: female  Primary Care Provider: No PCP Per Patient  Chief Complaint: chest pain, L sided weakness   History of Present Illness:This is a 53 y.o. year old female with significant past medical history of CAD w/ nonobstructive disease on cath, medical noncompliance, malgnant HTN, COPD, diastolic dysfunction  presenting with chest pain, L sided weakness. Pt has had multiple admissions for similar sxs recently with normal workup. States that she had recurrent L sided weakness that is worse than baseline. No confusion. No numbness or tingling. No confusion. Also with acute on chronic CP over past 3-4 days. Intermittent in nature. States that CP is relieved when she takes her muscle relaxer. However, sxs persisted today despite muscle relaxer use. No diaphoresis. Diffuse radiation. No SOB. Still smoking > 1 PPD.  Presented to ER hemodynamically stable. BP 150s-170s/70s-80s, satting 96% on RA. WBC 10.4, hgb 12.5, Cr 1. Trop neg x1. CXR, Head CT WNL. Tele-neuro at [facility] evaluated pt. Recommends MRI as pt has had full CVA workup recently.   Assessment and Plan: SUNNIE ODDEN is a 53 y.o. year old female presenting with L sided weakness, chest pain   Active Problems:   Left-sided weakness   1- L sided weakness -recurrent issue -mild weakness on exam-seems to be baseline -has had full workup recently -baby ASA -MRI brain -neuro consult as clinically indicated   2-Chest Pain  -recurrent issue  -trop neg x1 -EKG WNL per report  -fairly atypical sxs- pain relieved w/ muscle relaxer  -noted myoview 09/2013 negative for inducible ischemia or significant wall motion abnormalities -cycle CEs -tele bed  -baby ASA  3- HTN  -mildly elevated on presentation -poorly controlled per pt  -cont home regimen -titrate   4-COPD  -stable from a resp  standpoint -cont home regimen   FEN/GI: heart healthy diet  Prophylaxis: sub q heparin  Disposition: pending further evaluation  Code Status:Full Code    Patient Active Problem List   Diagnosis Date Noted  . Malignant hypertension   . Hypokalemia 04/16/2014  . Muscle weakness (generalized) 04/05/2014  . Stiffness of left hip joint 04/05/2014  . Pain in left hip 04/05/2014  . Diastolic dysfunction 91/47/8295  . CAD (coronary artery disease) 03/20/2014  . PUD (peptic ulcer disease) 03/20/2014  . Type 2 diabetes, uncontrolled, with neuropathy 03/20/2014  . Morbid obesity 03/20/2014  . Left-sided weakness 03/20/2014  . Paresthesias 03/20/2014  . Cerebrovascular disease 03/20/2014  . Dyspnea 03/05/2012  . Chest pain 03/05/2012  . Tobacco abuse 03/05/2012  . COPD (chronic obstructive pulmonary disease) 11/23/2011  . GERD (gastroesophageal reflux disease) 11/23/2011  . HTN (hypertension) 11/23/2011   Past Medical History: Past Medical History  Diagnosis Date  . Type 2 diabetes mellitus   . Essential hypertension   . Hyperlipidemia   . COPD (chronic obstructive pulmonary disease)   . GERD (gastroesophageal reflux disease)   . CAD (coronary artery disease)     Moderate nonobstructive 2012-2013, [place]  . Hypothyroid   . Lung nodules   . Diverticulosis   . Bipolar affective   . Left-sided weakness 03/20/2014    MRI brain negative for stroke.    Past Surgical History: Past Surgical History  Procedure Laterality Date  . Back surgery    . Abdominal hysterectomy    . Appendectomy    . Tonsillectomy    . Ankle surgery    .  Cholecystectomy    . Nose surgery      Social History: History   Social History  . Marital Status: Divorced    Spouse Name: N/A    Number of Children: N/A  . Years of Education: N/A   Occupational History  .      Disability for back pain   Social History Main Topics  . Smoking status: Current Every Day Smoker -- 1.00 packs/day for 40  years    Types: Cigarettes  . Smokeless tobacco: Never Used  . Alcohol Use: No  . Drug Use: No  . Sexual Activity: None   Other Topics Concern  . None   Social History Narrative    Family History: Family History  Problem Relation Age of Onset  . Coronary artery disease Father 1  . Emphysema Father     Allergies: Allergies  Allergen Reactions  . Penicillins Anaphylaxis  . Pineapple Rash  . Strawberry Rash  . Poultry Meal     Allergic to [place]  . Aspartame And Phenylalanine Palpitations    Current Facility-Administered Medications  Medication Dose Route Frequency Provider Last Rate Last Dose  . 0.9 %  sodium chloride infusion   Intravenous Continuous Shanda Howells, MD      . amLODipine (NORVASC) tablet 10 mg  10 mg Oral Daily Shanda Howells, MD      . aspirin EC tablet 81 mg  81 mg Oral Daily Shanda Howells, MD      . carisoprodol (SOMA) tablet 350 mg  350 mg Oral QID Shanda Howells, MD      . cloNIDine (CATAPRES - Dosed in mg/24 hr) patch 0.3 mg  0.3 mg Transdermal Weekly Shanda Howells, MD      . gabapentin (NEURONTIN) tablet 600 mg  600 mg Oral TID Shanda Howells, MD      . heparin injection 5,000 Units  5,000 Units Subcutaneous 3 times per day Shanda Howells, MD      . isosorbide mononitrate (IMDUR) 24 hr tablet 60 mg  60 mg Oral QPM Shanda Howells, MD      . labetalol (NORMODYNE) tablet 300 mg  300 mg Oral BID Shanda Howells, MD      . levothyroxine (SYNTHROID, LEVOTHROID) tablet 50 mcg  50 mcg Oral QHS Shanda Howells, MD      . oxyCODONE-acetaminophen (PERCOCET/ROXICET) 5-325 MG per tablet 1 tablet  1 tablet Oral Q4H PRN Shanda Howells, MD      . pantoprazole (PROTONIX) EC tablet 80 mg  80 mg Oral Daily Shanda Howells, MD      . rOPINIRole (REQUIP) tablet 3 mg  3 mg Oral QHS Shanda Howells, MD      . simvastatin (ZOCOR) tablet 20 mg  20 mg Oral Daily Shanda Howells, MD      . sodium chloride 0.9 % injection 3 mL  3 mL Intravenous Q12H Shanda Howells, MD      . Derrill Memo ON  05/30/2014] triamterene-hydrochlorothiazide (DYAZIDE) 37.5-25 MG per capsule 1 capsule  1 capsule Oral q morning - 10a Shanda Howells, MD      . zolpidem Lorrin Mais) tablet 10 mg  10 mg Oral QHS Shanda Howells, MD       Current Outpatient Prescriptions  Medication Sig Dispense Refill  . amLODipine (NORVASC) 10 MG tablet Take 1 tablet (10 mg total) by mouth daily. 30 tablet 0  . carisoprodol (SOMA) 350 MG tablet Take 350 mg by mouth 4 (four) times daily.   3  . cloNIDine (CATAPRES -  DOSED IN MG/24 HR) 0.3 mg/24hr patch Place 1 patch (0.3 mg total) onto the skin once a week. Sundays 4 patch 1  . dexlansoprazole (DEXILANT) 60 MG capsule Take 1 capsule (60 mg total) by mouth every evening. 30 capsule 1  . EPINEPHrine (EPI-PEN) 0.3 mg/0.3 mL DEVI Inject 0.3 mg into the muscle once.    . gabapentin (NEURONTIN) 600 MG tablet Take 1 tablet (600 mg total) by mouth 3 (three) times daily. 90 tablet 1  . isosorbide mononitrate (IMDUR) 60 MG 24 hr tablet Take 1 tablet (60 mg total) by mouth every evening. 30 tablet 1  . labetalol (NORMODYNE) 300 MG tablet Take 1 tablet (300 mg total) by mouth 2 (two) times daily. 60 tablet 1  . levothyroxine (SYNTHROID, LEVOTHROID) 50 MCG tablet Take 1 tablet (50 mcg total) by mouth at bedtime. 30 tablet 1  . oxyCODONE-acetaminophen (PERCOCET/ROXICET) 5-325 MG per tablet Take 1 tablet by mouth every 4 (four) hours as needed for severe pain (for pain). 30 tablet 0  . rOPINIRole (REQUIP) 3 MG tablet Take 1 tablet (3 mg total) by mouth at bedtime. 30 tablet 0  . simvastatin (ZOCOR) 20 MG tablet Take 1 tablet (20 mg total) by mouth daily. 30 tablet 3  . triamterene-hydrochlorothiazide (DYAZIDE) 37.5-25 MG per capsule Take 1 each (1 capsule total) by mouth every morning. 30 capsule 1  . zolpidem (AMBIEN) 10 MG tablet Take 1 tablet (10 mg total) by mouth at bedtime. 30 tablet 0  . furosemide (LASIX) 20 MG tablet Take 1 tablet (20 mg total) by mouth daily as needed for fluid. 30 tablet 0    Review Of Systems: 12 point ROS negative except as noted above in HPI.  Physical Exam: Filed Vitals:   05/29/14 2115  BP:   Pulse: 73  Resp:     General: alert and cooperative HEENT: PERRLA and extra ocular movement intact Heart: S1, S2 normal, no murmur, rub or gallop, regular rate and rhythm Lungs: clear to auscultation, no wheezes or rales and unlabored breathing Abdomen: abdomen is soft without significant tenderness, masses, organomegaly or guarding Extremities: extremities normal, atraumatic, no cyanosis or edema Skin:no rashes Neurology: minimal to mild L sided weakness, otherwise grossly normal exam   Labs and Imaging: Lab Results  Component Value Date/Time   NA 144 05/29/2014 08:10 PM   K 3.5 05/29/2014 08:10 PM   CL 110 05/29/2014 08:10 PM   CO2 24 05/29/2014 08:02 PM   BUN 21 05/29/2014 08:10 PM   CREATININE 1.00 05/29/2014 08:10 PM   GLUCOSE 94 05/29/2014 08:10 PM   Lab Results  Component Value Date   WBC 10.4 05/29/2014   HGB 12.9 05/29/2014   HCT 38.0 05/29/2014   MCV 90.4 05/29/2014   PLT 243 05/29/2014    Ct Head Wo Contrast  05/29/2014   CLINICAL DATA:  Code stroke, chest pain for 2 days, shortness of breath, nausea and vomiting. LEFT-sided weakness for 1 hr.  EXAM: CT HEAD WITHOUT CONTRAST  TECHNIQUE: Contiguous axial images were obtained from the base of the skull through the vertex without intravenous contrast.  COMPARISON:  CT of the head March 18, 2014 and MRI of the head October 13 and 14, 2015.  FINDINGS: The ventricles and sulci are normal. No intraparenchymal hemorrhage, mass effect nor midline shift. No acute large vascular territory infarcts. Mild white matter changes are similar.  No abnormal extra-axial fluid collections. Basal cisterns are patent.  No skull fracture. The included ocular globes and orbital  contents are non-suspicious. The mastoid aircells and included paranasal sinuses are well-aerated.  IMPRESSION: No acute intracranial  process. If ongoing concern for acute ischemia, MRI of the brain with diffusion-weighted sequences would be more sensitive.  Mild nonspecific white matter changes were better characterized on the recent MRI of the brain.  Acute findings discussed with and reconfirmed by Dr.STEPHEN RANCOUR on 05/29/2014 at 7:54 pm.   Electronically Signed   By: Elon Alas   On: 05/29/2014 19:56   Dg Chest Portable 1 View  05/29/2014   CLINICAL DATA:  Chest pain.  Hypertension.  COPD.  EXAM: PORTABLE CHEST - 1 VIEW  COMPARISON:  04/15/2014  FINDINGS: Vague density at the left lung base is thought to be due to soft tissues of the left chest wall rather than a left lower lobe opacity. There is no silhouetting of the hemidiaphragm, or cardiac border.  Accordingly, the lungs are thought to be clear. Cardiac and mediastinal margins appear normal.  IMPRESSION: 1.  No significant abnormality identified.   Electronically Signed   By: Sherryl Barters M.D.   On: 05/29/2014 20:39           Shanda Howells MD  Pager: [phone]

## 2014-05-29 NOTE — ED Notes (Signed)
Verbal order from Dr. Wyvonnia Dusky for pt. To be transported off Tele

## 2014-05-30 ENCOUNTER — Observation Stay (HOSPITAL_COMMUNITY): Payer: Medicare Other

## 2014-05-30 DIAGNOSIS — I10 Essential (primary) hypertension: Secondary | ICD-10-CM | POA: Diagnosis not present

## 2014-05-30 DIAGNOSIS — R079 Chest pain, unspecified: Secondary | ICD-10-CM | POA: Diagnosis not present

## 2014-05-30 DIAGNOSIS — M6289 Other specified disorders of muscle: Secondary | ICD-10-CM | POA: Diagnosis not present

## 2014-05-30 LAB — TROPONIN I
Troponin I: <0.03 ng/mL (ref <0.031)
Troponin I: <0.03 ng/mL (ref <0.031)

## 2014-05-30 LAB — COMPREHENSIVE METABOLIC PANEL
ALT: 14 U/L (ref 0–35)
AST: 17 U/L (ref 0–37)
Albumin: 3.2 g/dL — ABNORMAL LOW (ref 3.5–5.2)
Alkaline Phosphatase: 61 U/L (ref 39–117)
Anion gap: 5 (ref 5–15)
BUN: 19 mg/dL (ref 6–23)
CHLORIDE: 112 meq/L (ref 96–112)
CO2: 26 mmol/L (ref 19–32)
CREATININE: 0.74 mg/dL (ref 0.50–1.10)
Calcium: 8.3 mg/dL — ABNORMAL LOW (ref 8.4–10.5)
GFR calc Af Amer: >90 mL/min (ref >90)
GFR calc non Af Amer: >90 mL/min (ref >90)
Glucose, Bld: 95 mg/dL (ref 70–99)
Potassium: 3.3 mmol/L — ABNORMAL LOW (ref 3.5–5.1)
Sodium: 143 mmol/L (ref 135–145)
Total Bilirubin: 0.3 mg/dL (ref 0.3–1.2)
Total Protein: 6.2 g/dL (ref 6.0–8.3)

## 2014-05-30 LAB — CBC WITH DIFFERENTIAL/PLATELET
Basophils Absolute: 0.1 10*3/uL (ref 0.0–0.1)
Basophils Relative: 1 % (ref 0–1)
EOS ABS: 0.3 10*3/uL (ref 0.0–0.7)
Eosinophils Relative: 4 % (ref 0–5)
HCT: 38 % (ref 36.0–46.0)
Hemoglobin: 12.3 g/dL (ref 12.0–15.0)
LYMPHS PCT: 49 % — AB (ref 12–46)
Lymphs Abs: 4 10*3/uL (ref 0.7–4.0)
MCH: 29.5 pg (ref 26.0–34.0)
MCHC: 32.4 g/dL (ref 30.0–36.0)
MCV: 91.1 fL (ref 78.0–100.0)
Monocytes Absolute: 0.6 10*3/uL (ref 0.1–1.0)
Monocytes Relative: 8 % (ref 3–12)
Neutro Abs: 3.2 10*3/uL (ref 1.7–7.7)
Neutrophils Relative %: 39 % — ABNORMAL LOW (ref 43–77)
Platelets: 240 10*3/uL (ref 150–400)
RBC: 4.17 MIL/uL (ref 3.87–5.11)
RDW: 14.3 % (ref 11.5–15.5)
WBC: 8.2 10*3/uL (ref 4.0–10.5)

## 2014-05-30 MED ORDER — LISINOPRIL 10 MG PO TABS
20.0000 mg | ORAL_TABLET | Freq: Every day | ORAL | Status: DC
  Administered 2014-05-30: 20 mg via ORAL
  Filled 2014-05-30: qty 2

## 2014-05-30 MED ORDER — ASPIRIN 81 MG PO TBEC: 81.0000 mg | DELAYED_RELEASE_TABLET | Freq: Every day | ORAL | Status: DC

## 2014-05-30 MED ORDER — LORAZEPAM 2 MG/ML IJ SOLN
1.0000 mg | Freq: Once | INTRAMUSCULAR | Status: AC | PRN
  Administered 2014-05-30: 2 mg via INTRAVENOUS
  Filled 2014-05-30: qty 1

## 2014-05-30 MED ORDER — LISINOPRIL 20 MG PO TABS: 20.0000 mg | ORAL_TABLET | Freq: Every day | ORAL | Status: DC

## 2014-05-30 MED ORDER — ROPINIROLE HCL 1 MG PO TABS
ORAL_TABLET | ORAL | Status: AC
  Filled 2014-05-30: qty 3

## 2014-05-30 MED ORDER — TRIAMTERENE-HCTZ 37.5-25 MG PO TABS
1.0000 | ORAL_TABLET | Freq: Every day | ORAL | Status: DC
  Administered 2014-05-30: 1 via ORAL
  Filled 2014-05-30: qty 1

## 2014-05-30 MED ORDER — LABETALOL HCL 200 MG PO TABS
ORAL_TABLET | ORAL | Status: AC
  Filled 2014-05-30: qty 2

## 2014-05-30 MED ORDER — POTASSIUM CHLORIDE CRYS ER 20 MEQ PO TBCR
60.0000 meq | EXTENDED_RELEASE_TABLET | Freq: Once | ORAL | Status: AC
  Administered 2014-05-30: 60 meq via ORAL
  Filled 2014-05-30: qty 3

## 2014-05-30 MED ORDER — FUROSEMIDE 10 MG/ML IJ SOLN
40.0000 mg | Freq: Once | INTRAMUSCULAR | Status: AC
  Administered 2014-05-30: 40 mg via INTRAVENOUS
  Filled 2014-05-30: qty 4

## 2014-05-30 NOTE — Progress Notes (Signed)
Informed patient that she would be going for MRI this morning.  Patient expressed to me that she is claustrophobic and has a hard time sitting still during MRI.  Notified MD.  Received 1 time dose of IV ativan.  Also, notified Manuela Schwartz in MRI that I would be administering the ativan prior to MRI.  Will continue to monitor patient.

## 2014-05-30 NOTE — Discharge Summary (Signed)
Physician Discharge Summary  Katrina [name] WER:[id number] DOB: [birth year] DOA: 05/29/2014  PCP: No PCP Per Patient  Admit date: 05/29/2014 Discharge date: 05/30/2014  Time spent: 40 minutes  Recommendations for Outpatient Follow-up:  1. Follow-up with primary care physician within one week. 2. Follow-up with Dr. Merlene Laughter of neurology within 1 week.  Discharge Diagnoses:  Active Problems:   Left-sided weakness   Discharge Condition: Stable  Diet recommendation: Heart healthy  There were no vitals filed for this visit.  History of present illness:  This is a 53 y.o. year old female with significant past medical history of CAD w/ nonobstructive disease on cath, medical noncompliance, malgnant HTN, COPD, diastolic dysfunction presenting with chest pain, L sided weakness. Pt has had multiple admissions for similar sxs recently with normal workup. States that she had recurrent L sided weakness that is worse than baseline. No confusion. No numbness or tingling. No confusion. Also with acute on chronic CP over past 3-4 days. Intermittent in nature. States that CP is relieved when she takes her muscle relaxer. However, sxs persisted today despite muscle relaxer use. No diaphoresis. Diffuse radiation. No SOB. Still smoking > 1 PPD.  Presented to ER hemodynamically stable. BP 150s-170s/70s-80s, satting 96% on RA. WBC 10.4, hgb 12.5, Cr 1. Trop neg x1. CXR, Head CT WNL. Tele-neuro at [facility] evaluated pt. Recommends MRI as pt has had full CVA workup recently.   Hospital Course:   Left sided weakness -recurrent issue, were done before multiple times. -Presented with mild weakness, patient back to baseline prior to discharge. -baby ASA -MRI brain is negative for acute events, tried to call neurology no answer from the office. -Patient to follow-up with neurology as outpatient, reportedly she is being worked up for [place] as outpatient. -Patient asked to be discharge today (Christmas Eve), they found  its not unreasonable as her MRI is negative and her elevated blood pressure is chronic.  Chest Pain  -Patient has recurrent chest pain -fairly atypical sxs- pain relieved w/ muscle relaxer  -noted myoview 09/2013 negative for inducible ischemia or significant wall motion abnormalities -Likely noncardiac, has -3 sets of cardiac enzymes and 12-lead EKG.   Malignant hypertension -Blood pressure is elevated on presentation continued to be elevated. -poorly controlled per pt  -Home regimen continued, lisinopril 20 mg added. Per patient she is being worked up as outpatient for OSA. -I have asked her to follow-up with her cardiologist for further evaluation.  COPD  -stable from a resp standpoint -cont home regimen  Procedures:  None  Consultations:  None  Discharge Exam: Filed Vitals:   05/30/14 1016  BP: 177/100  Pulse: 69  Temp:   Resp:    General: Alert and awake, oriented x3, not in any acute distress. HEENT: anicteric sclera, pupils reactive to light and accommodation, EOMI CVS: S1-S2 clear, no murmur rubs or gallops Chest: clear to auscultation bilaterally, no wheezing, rales or rhonchi Abdomen: soft nontender, nondistended, normal bowel sounds, no organomegaly Extremities: no cyanosis, clubbing or edema noted bilaterally Neuro: Cranial nerves II-XII intact, no focal neurological deficits  Discharge Instructions   Discharge Instructions    Diet - low sodium heart healthy    Complete by:  As directed      Increase activity slowly    Complete by:  As directed           Current Discharge Medication List    START taking these medications   Details  aspirin EC 81 MG EC tablet Take 1 tablet (81  mg total) by mouth daily.    lisinopril (PRINIVIL,ZESTRIL) 20 MG tablet Take 1 tablet (20 mg total) by mouth daily. Qty: 30 tablet, Refills: 0      CONTINUE these medications which have NOT CHANGED   Details  amLODipine (NORVASC) 10 MG tablet Take 1 tablet (10 mg  total) by mouth daily. Qty: 30 tablet, Refills: 0    carisoprodol (SOMA) 350 MG tablet Take 350 mg by mouth 4 (four) times daily.  Refills: 3    cloNIDine (CATAPRES - DOSED IN MG/24 HR) 0.3 mg/24hr patch Place 1 patch (0.3 mg total) onto the skin once a week. Sundays Qty: 4 patch, Refills: 1    dexlansoprazole (DEXILANT) 60 MG capsule Take 1 capsule (60 mg total) by mouth every evening. Qty: 30 capsule, Refills: 1    EPINEPHrine (EPI-PEN) 0.3 mg/0.3 mL DEVI Inject 0.3 mg into the muscle once.    gabapentin (NEURONTIN) 600 MG tablet Take 1 tablet (600 mg total) by mouth 3 (three) times daily. Qty: 90 tablet, Refills: 1    isosorbide mononitrate (IMDUR) 60 MG 24 hr tablet Take 1 tablet (60 mg total) by mouth every evening. Qty: 30 tablet, Refills: 1    labetalol (NORMODYNE) 300 MG tablet Take 1 tablet (300 mg total) by mouth 2 (two) times daily. Qty: 60 tablet, Refills: 1    levothyroxine (SYNTHROID, LEVOTHROID) 50 MCG tablet Take 1 tablet (50 mcg total) by mouth at bedtime. Qty: 30 tablet, Refills: 1    oxyCODONE-acetaminophen (PERCOCET/ROXICET) 5-325 MG per tablet Take 1 tablet by mouth every 4 (four) hours as needed for severe pain (for pain). Qty: 30 tablet, Refills: 0    rOPINIRole (REQUIP) 3 MG tablet Take 1 tablet (3 mg total) by mouth at bedtime. Qty: 30 tablet, Refills: 0    simvastatin (ZOCOR) 20 MG tablet Take 1 tablet (20 mg total) by mouth daily. Qty: 30 tablet, Refills: 3    triamterene-hydrochlorothiazide (DYAZIDE) 37.5-25 MG per capsule Take 1 each (1 capsule total) by mouth every morning. Qty: 30 capsule, Refills: 1    zolpidem (AMBIEN) 10 MG tablet Take 1 tablet (10 mg total) by mouth at bedtime. Qty: 30 tablet, Refills: 0    furosemide (LASIX) 20 MG tablet Take 1 tablet (20 mg total) by mouth daily as needed for fluid. Qty: 30 tablet, Refills: 0       Allergies  Allergen Reactions  . Penicillins Anaphylaxis  . Pineapple Rash  . Strawberry Rash  .  Poultry Meal     Allergic to [place]  . Aspartame And Phenylalanine Palpitations   Follow-up Information    Follow up with [facility], KOFI, MD In 1 week.   Specialty:  Neurology   Contact information:   [address] [place] [address] [phone]        The results of significant diagnostics from this hospitalization (including imaging, microbiology, ancillary and laboratory) are listed below for reference.    Significant Diagnostic Studies: Ct Head Wo Contrast  05/29/2014   CLINICAL DATA:  Code stroke, chest pain for 2 days, shortness of breath, nausea and vomiting. LEFT-sided weakness for 1 hr.  EXAM: CT HEAD WITHOUT CONTRAST  TECHNIQUE: Contiguous axial images were obtained from the base of the skull through the vertex without intravenous contrast.  COMPARISON:  CT of the head March 18, 2014 and MRI of the head October 13 and 14, 2015.  FINDINGS: The ventricles and sulci are normal. No intraparenchymal hemorrhage, mass effect nor midline shift. No acute  large vascular territory infarcts. Mild white matter changes are similar.  No abnormal extra-axial fluid collections. Basal cisterns are patent.  No skull fracture. The included ocular globes and orbital contents are non-suspicious. The mastoid aircells and included paranasal sinuses are well-aerated.  IMPRESSION: No acute intracranial process. If ongoing concern for acute ischemia, MRI of the brain with diffusion-weighted sequences would be more sensitive.  Mild nonspecific white matter changes were better characterized on the recent MRI of the brain.  Acute findings discussed with and reconfirmed by Dr.STEPHEN RANCOUR on 05/29/2014 at 7:54 pm.   Electronically Signed   By: Elon Alas   On: 05/29/2014 19:56   Mr Brain Wo Contrast  05/30/2014   CLINICAL DATA:  53 year old diabetic hypertensive female with history of hyperlipidemia presenting with left-sided weakness. Subsequent encounter.  EXAM: MRI HEAD WITHOUT CONTRAST   TECHNIQUE: Multiplanar, multiecho pulse sequences of the brain and surrounding structures were obtained without intravenous contrast.  COMPARISON:  05/29/2014 head CT, 03/19/2014 and 03/20/2014 brain MR.  FINDINGS: Patient was claustrophobic despite medication and exam is motion degraded.  No acute infarct.  No intracranial hemorrhage.  Scattered punctate and patchy nonspecific white matter type changes similar to prior exam. In this 53 year old diabetic hypertensive patient with hyperlipidemia, white matter type changes are probably related to result of small vessel disease. Other causes of white matter type changes not entirely excluded although felt to be less likely include that secondary to; demyelinating process, vasculitis, inflammatory process or migraine headaches.  No hydrocephalus.  Small left vertebral artery once again noted. Right vertebral artery, basilar artery and internal carotid arteries are patent as are the major dural sinuses.  Partially empty sella unchanged. No flattening of the globes as can be seen with pseudotumor cerebri  Cervical medullary junction and pineal region unremarkable.  IMPRESSION: Motion degraded examination.  No acute infarct.  No significant change in nonspecific white matter type changes which may reflect changes of small vessel disease. Please see above.   Electronically Signed   By: Chauncey Cruel M.D.   On: 05/30/2014 12:37   Dg Chest Portable 1 View  05/29/2014   CLINICAL DATA:  Chest pain.  Hypertension.  COPD.  EXAM: PORTABLE CHEST - 1 VIEW  COMPARISON:  04/15/2014  FINDINGS: Vague density at the left lung base is thought to be due to soft tissues of the left chest wall rather than a left lower lobe opacity. There is no silhouetting of the hemidiaphragm, or cardiac border.  Accordingly, the lungs are thought to be clear. Cardiac and mediastinal margins appear normal.  IMPRESSION: 1.  No significant abnormality identified.   Electronically Signed   By: Sherryl Barters M.D.   On: 05/29/2014 20:39    Microbiology: No results found for this or any previous visit (from the past 240 hour(s)).   Labs: Basic Metabolic Panel:  Recent Labs Lab 05/29/14 2002 05/29/14 2010 05/30/14 0404  NA 142 144 143  K 3.5 3.5 3.3*  CL 111 110 112  CO2 24  --  26  GLUCOSE 95 94 95  BUN 23 21 19   CREATININE 0.96 1.00 0.74  CALCIUM 8.9  --  8.3*   Liver Function Tests:  Recent Labs Lab 05/29/14 2002 05/30/14 0404  AST 21 17  ALT 17 14  ALKPHOS 78 61  BILITOT 0.3 0.3  PROT 7.1 6.2  ALBUMIN 3.7 3.2*   No results for input(s): LIPASE, AMYLASE in the last 168 hours. No results for input(s): AMMONIA in the  last 168 hours. CBC:  Recent Labs Lab 05/29/14 2002 05/29/14 2010 05/30/14 0404  WBC 10.4  --  8.2  NEUTROABS 4.0  --  3.2  HGB 12.5 12.9 12.3  HCT 38.5 38.0 38.0  MCV 90.4  --  91.1  PLT 243  --  240   Cardiac Enzymes:  Recent Labs Lab 05/29/14 2002 05/29/14 2223 05/30/14 0404 05/30/14 0935  TROPONINI <0.03 <0.03 <0.03 <0.03   BNP: BNP (last 3 results)  Recent Labs  09/22/13 2200  PROBNP 259.2*   CBG:  Recent Labs Lab 05/29/14 2011  GLUCAP 100*       Signed:  B[place]watife Flight A  [facility] 05/30/2014, 1:12 PM

## 2014-05-30 NOTE — Care Management Note (Signed)
    Page 1 of 1   05/30/2014     8:46:03 AM CARE MANAGEMENT NOTE 05/30/2014  Patient:  Katrina [name], Katrina [name]   Account Number:  [id number]  Date Initiated:  05/30/2014  Documentation initiated by:  Theophilus Kinds  Subjective/Objective Assessment:   Pt admitted from home with left sided weakness. Pt lives with her daughter and will return home at discharge. Pt has a cane at home but is fairly independent with ADL's. Pt receives followup care at the [facility].     Action/Plan:   Anticipate discharge within 24 hours. No Cm needs noted.   Anticipated DC Date:  05/31/2014   Anticipated DC Plan:  [place]  CM consult      Choice offered to / List presented to:             Status of service:  Completed, signed off Medicare Important Message given?   (If response is "NO", the following Medicare IM given date fields will be blank) Date Medicare IM given:   Medicare IM given by:   Date Additional Medicare IM given:   Additional Medicare IM given by:    Discharge Disposition:  HOME/SELF CARE  Per UR Regulation:    If discussed at Long Length of Stay Meetings, dates discussed:    Comments:  05/30/14 0845 Christinia Gully, RN BSN CM

## 2014-05-30 NOTE — Progress Notes (Signed)
Late entry for 05/29/14 at 2300. During admission and fall risk assessment, I educated the patient about fall safety and prevention. The patient has refused the bed alarm and stated, " I am not going to feel like I am in prison."

## 2014-05-30 NOTE — Progress Notes (Signed)
UR completed 

## 2014-05-30 NOTE — Progress Notes (Signed)
Patient was discharged home today with husband and daughter.  Patient was given discharge instructions and prescriptions.  Patient verbalized understanding with no complaints or concerns voiced at this time.  IV was removed with catheter intact, no bleeding or complications.  Patient left unit in stable condition in a wheelchair by a staff member.

## 2014-05-30 NOTE — Progress Notes (Signed)
Patient presented with left-sided weakness. This is being recurrent issues, labeled before with recurrent TIAs. MRI to be done, tried to call neurology office no answers. Patient wants to go home today, likely to be discharged if MRI is negative. Malignant hypertension, patient is on multiple medications including amlodipine, clonidine, Imdur, Dyazide and labetalol. I added lisinopril 20 mg, 1 dose of Lasix today.  Birdie Hopes Pager: [phone] 05/30/2014, 10:33 AM

## 2014-05-30 NOTE — Progress Notes (Signed)
Dr. Hartford Poli notifed of MRI resulting, per MD request.  Dr. Hartford Poli going to discharge patient.

## 2014-06-05 ENCOUNTER — Ambulatory Visit (INDEPENDENT_AMBULATORY_CARE_PROVIDER_SITE_OTHER): Payer: Medicare Other | Admitting: Cardiovascular Disease

## 2014-06-05 ENCOUNTER — Encounter: Payer: Self-pay | Admitting: Cardiovascular Disease

## 2014-06-05 VITALS — BP 168/95 | HR 79 | Ht 67.0 in | Wt 211.4 lb

## 2014-06-05 DIAGNOSIS — I25118 Atherosclerotic heart disease of native coronary artery with other forms of angina pectoris: Secondary | ICD-10-CM

## 2014-06-05 DIAGNOSIS — I639 Cerebral infarction, unspecified: Secondary | ICD-10-CM

## 2014-06-05 DIAGNOSIS — E785 Hyperlipidemia, unspecified: Secondary | ICD-10-CM

## 2014-06-05 DIAGNOSIS — I5032 Chronic diastolic (congestive) heart failure: Secondary | ICD-10-CM

## 2014-06-05 DIAGNOSIS — I701 Atherosclerosis of renal artery: Secondary | ICD-10-CM | POA: Diagnosis not present

## 2014-06-05 DIAGNOSIS — Z9289 Personal history of other medical treatment: Secondary | ICD-10-CM

## 2014-06-05 DIAGNOSIS — Z87898 Personal history of other specified conditions: Secondary | ICD-10-CM

## 2014-06-05 DIAGNOSIS — E038 Other specified hypothyroidism: Secondary | ICD-10-CM | POA: Diagnosis not present

## 2014-06-05 DIAGNOSIS — I10 Essential (primary) hypertension: Secondary | ICD-10-CM | POA: Diagnosis not present

## 2014-06-05 DIAGNOSIS — I209 Angina pectoris, unspecified: Secondary | ICD-10-CM

## 2014-06-05 MED ORDER — AMLODIPINE BESYLATE 10 MG PO TABS: 10.0000 mg | ORAL_TABLET | Freq: Every day | ORAL | Status: DC

## 2014-06-05 NOTE — Progress Notes (Signed)
Patient ID: Katrina [name], female   DOB: 1960-08-10, 53 y.o.   MRN: [id number]      SUBJECTIVE: The patient was recently hospitalized for left sided weakness and chest pain. Her chest pain was relieved with a muscle relaxant and was deemed atypical for a cardiac etiology. She did not have an acute stroke. She was noted to have malignant hypertension and lisinopril 20 mg daily was added to her medication regimen. I reviewed all relevant labs and studies along with documentation from her hospitalization. Troponins were normal. ECG on 05/30/2014 demonstrated normal sinus rhythm with no ischemic ST segment or T-wave abnormalities. Chest x-ray was normal. Head CT and brain MRI did not demonstrate any acute processes.  She was hospitalized in November 2015 for chest pain and uncontrolled HTN as well, and it was deemed her chest pain may have been due to demand ischemia vs microvascular angina.  She has not had a prescription for amlodipine and BP today is 168/95. She has been having left precordial chest pain as well. 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 Previous providers have considered renal artery stenting as per patient. Insurance would not pay for azilsartan, and want to stopy paying for her clonidine patch as well.   Review of Systems: As per "subjective", otherwise negative.  Allergies  Allergen Reactions  . Penicillins Anaphylaxis  . Pineapple Rash  . Strawberry Rash  . Poultry Meal     Allergic to [place]  . Aspartame And Phenylalanine Palpitations    Current Outpatient Prescriptions  Medication Sig Dispense Refill  . amLODipine (NORVASC) 10 MG tablet Take 1 tablet (10 mg total) by mouth daily. 30 tablet 0  . aspirin EC 81 MG EC tablet Take 1 tablet (81 mg total) by mouth daily.    . carisoprodol (SOMA) 350 MG tablet Take 350 mg by mouth 4 (four) times daily.   3  . cloNIDine (CATAPRES - DOSED IN MG/24 HR) 0.3 mg/24hr patch Place 1 patch (0.3 mg  total) onto the skin once a week. Sundays 4 patch 1  . dexlansoprazole (DEXILANT) 60 MG capsule Take 1 capsule (60 mg total) by mouth every evening. 30 capsule 1  . EPINEPHrine (EPI-PEN) 0.3 mg/0.3 mL DEVI Inject 0.3 mg into the muscle once.    . furosemide (LASIX) 20 MG tablet Take 1 tablet (20 mg total) by mouth daily as needed for fluid. 30 tablet 0  . gabapentin (NEURONTIN) 600 MG tablet Take 1 tablet (600 mg total) by mouth 3 (three) times daily. 90 tablet 1  . isosorbide mononitrate (IMDUR) 60 MG 24 hr tablet Take 1 tablet (60 mg total) by mouth every evening. 30 tablet 1  . labetalol (NORMODYNE) 300 MG tablet Take 1 tablet (300 mg total) by mouth 2 (two) times daily. 60 tablet 1  . levothyroxine (SYNTHROID, LEVOTHROID) 50 MCG tablet Take 1 tablet (50 mcg total) by mouth at bedtime. 30 tablet 1  . lisinopril (PRINIVIL,ZESTRIL) 20 MG tablet Take 1 tablet (20 mg total) by mouth daily. 30 tablet 0  . oxyCODONE-acetaminophen (PERCOCET/ROXICET) 5-325 MG per tablet Take 1 tablet by mouth every 4 (four) hours as needed for severe pain (for pain). 30 tablet 0  . rOPINIRole (REQUIP) 3 MG tablet Take 1 tablet (3 mg total) by mouth at bedtime. 30 tablet 0  . simvastatin (ZOCOR) 20 MG tablet Take 1 tablet (20 mg total) by mouth daily. 30 tablet 3  . triamterene-hydrochlorothiazide (DYAZIDE) 37.5-25 MG per capsule Take  1 each (1 capsule total) by mouth every morning. 30 capsule 1  . zolpidem (AMBIEN) 10 MG tablet Take 1 tablet (10 mg total) by mouth at bedtime. 30 tablet 0   No current facility-administered medications for this visit.    Past Medical History  Diagnosis Date  . Type 2 diabetes mellitus   . Essential hypertension   . Hyperlipidemia   . COPD (chronic obstructive pulmonary disease)   . GERD (gastroesophageal reflux disease)   . CAD (coronary artery disease)     Moderate nonobstructive 2012-2013, [place]  . Hypothyroid   . Lung nodules   . Diverticulosis   . Bipolar affective   .  Left-sided weakness 03/20/2014    MRI brain negative for stroke.    Past Surgical History  Procedure Laterality Date  . Back surgery    . Abdominal hysterectomy    . Appendectomy    . Tonsillectomy    . Ankle surgery    . Cholecystectomy    . Nose surgery      History   Social History  . Marital Status: Divorced    Spouse Name: N/A    Number of Children: N/A  . Years of Education: N/A   Occupational History  .      Disability for back pain   Social History Main Topics  . Smoking status: Current Every Day Smoker -- 1.00 packs/day for 40 years    Types: Cigarettes  . Smokeless tobacco: Never Used  . Alcohol Use: No  . Drug Use: No  . Sexual Activity: Not on file   Other Topics Concern  . Not on file   Social History Narrative     Filed Vitals:   06/05/14 1131  BP: 168/95  Pulse: 79  Height: 5\' 7"  (1.702 m)  Weight: 211 lb 6.4 oz (95.89 kg)  SpO2: 97%    PHYSICAL EXAM General: NAD HEENT: Normal. Neck: No JVD, no thyromegaly. Lungs: Clear to auscultation bilaterally with normal respiratory effort. CV: Nondisplaced PMI.  Regular rate and rhythm, normal S1/S2, no S3/S4, no murmur. No pretibial or periankle edema.  No carotid bruit.  Normal pedal pulses.  Abdomen: Soft, nontender, no hepatosplenomegaly, no distention.  Neurologic: Alert and oriented x 3.  Psych: Normal affect. Skin: Normal. Musculoskeletal: Normal range of motion, no gross deformities. Extremities: No clubbing or cyanosis.   ECG: Most recent ECG reviewed.      ASSESSMENT AND PLAN: 1. Malignant HTN: Remains elevated. Will refill prescription for amlodipine 10 mg. Already on clonidine patch, labetalol, Imdur, lisinopril, and Dyazide. Will obtain renal artery Dopplers to evaluate for stenosis. 2. 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 For the time  being, I will continue Imdur 60 mg daily, labetalol, ASA 81 mg, along with statin therapy, and refill amlodipine 10 mg which should reduce BP and also hopefully provide anginal relief. I would not rule out the possibility of coronary angiography. 3. Hypothyroidism: Controlled with normal hormone levels in October. Continue levothyroxine 50 g daily. 4. Hyperlipidemia: Continue therapy with simvastatin 20 mg daily given history of coronary artery disease and history of myocardial infarction.  5. Chronic diastolic heart failure: Euvolemic at present. Takes Lasix prn. Goal is to control BP.  Dispo: f/u 8-10 weeks.  Time spent: 40 minutes, of which >50% spent reviewing labs, studies, and documentation of prior hospitalizations with patient, and discussing management strategy.  Kate Sable, M.D., F.A.C.C.

## 2014-06-05 NOTE — Patient Instructions (Signed)
Your physician has requested that you have a renal artery duplex. During this test, an ultrasound is used to evaluate blood flow to the kidneys. Allow one hour for this exam. Do not eat after midnight the day before and avoid carbonated beverages. Take your medications as you usually do. Office will contact with results via phone or letter.   Continue all current medications. Follow up in  2 months

## 2014-06-20 ENCOUNTER — Encounter (INDEPENDENT_AMBULATORY_CARE_PROVIDER_SITE_OTHER): Payer: Medicare Other

## 2014-06-20 ENCOUNTER — Other Ambulatory Visit (HOSPITAL_COMMUNITY): Payer: Self-pay | Admitting: Respiratory Therapy

## 2014-06-20 DIAGNOSIS — I10 Essential (primary) hypertension: Secondary | ICD-10-CM | POA: Diagnosis not present

## 2014-06-20 DIAGNOSIS — I701 Atherosclerosis of renal artery: Secondary | ICD-10-CM

## 2014-06-20 DIAGNOSIS — G4730 Sleep apnea, unspecified: Secondary | ICD-10-CM

## 2014-06-23 ENCOUNTER — Ambulatory Visit: Payer: Medicare Other | Attending: Neurology | Admitting: Sleep Medicine

## 2014-06-23 DIAGNOSIS — R5383 Other fatigue: Secondary | ICD-10-CM | POA: Diagnosis not present

## 2014-06-23 DIAGNOSIS — G4730 Sleep apnea, unspecified: Secondary | ICD-10-CM | POA: Diagnosis not present

## 2014-06-23 DIAGNOSIS — R0683 Snoring: Secondary | ICD-10-CM | POA: Insufficient documentation

## 2014-06-24 ENCOUNTER — Telehealth: Payer: Self-pay | Admitting: *Deleted

## 2014-06-24 NOTE — Telephone Encounter (Signed)
-----   Message from Herminio Commons, MD sent at 06/24/2014 11:59 AM EST ----- Normal renal arteries.

## 2014-06-24 NOTE — Telephone Encounter (Signed)
Pt made aware, forwarded to Reva Bores

## 2014-06-27 DIAGNOSIS — I10 Essential (primary) hypertension: Secondary | ICD-10-CM | POA: Diagnosis not present

## 2014-06-27 DIAGNOSIS — G2581 Restless legs syndrome: Secondary | ICD-10-CM | POA: Diagnosis not present

## 2014-07-02 DIAGNOSIS — F5105 Insomnia due to other mental disorder: Secondary | ICD-10-CM | POA: Diagnosis not present

## 2014-07-02 DIAGNOSIS — G4459 Other complicated headache syndrome: Secondary | ICD-10-CM | POA: Diagnosis not present

## 2014-07-02 DIAGNOSIS — R0683 Snoring: Secondary | ICD-10-CM | POA: Diagnosis not present

## 2014-07-02 DIAGNOSIS — R5383 Other fatigue: Secondary | ICD-10-CM | POA: Diagnosis not present

## 2014-07-06 ENCOUNTER — Inpatient Hospital Stay (HOSPITAL_COMMUNITY)
Admission: EM | Admit: 2014-07-06 | Discharge: 2014-07-07 | DRG: 313 | Discharge status: Against Medical Advice | Payer: Medicare Other | Attending: Internal Medicine | Admitting: Internal Medicine

## 2014-07-06 ENCOUNTER — Emergency Department (HOSPITAL_COMMUNITY): Payer: Medicare Other

## 2014-07-06 ENCOUNTER — Encounter (HOSPITAL_COMMUNITY): Payer: Self-pay | Admitting: *Deleted

## 2014-07-06 DIAGNOSIS — J449 Chronic obstructive pulmonary disease, unspecified: Secondary | ICD-10-CM | POA: Diagnosis not present

## 2014-07-06 DIAGNOSIS — I2510 Atherosclerotic heart disease of native coronary artery without angina pectoris: Secondary | ICD-10-CM | POA: Diagnosis present

## 2014-07-06 DIAGNOSIS — R531 Weakness: Secondary | ICD-10-CM

## 2014-07-06 DIAGNOSIS — E119 Type 2 diabetes mellitus without complications: Secondary | ICD-10-CM | POA: Diagnosis present

## 2014-07-06 DIAGNOSIS — F17210 Nicotine dependence, cigarettes, uncomplicated: Secondary | ICD-10-CM | POA: Diagnosis not present

## 2014-07-06 DIAGNOSIS — K219 Gastro-esophageal reflux disease without esophagitis: Secondary | ICD-10-CM | POA: Diagnosis present

## 2014-07-06 DIAGNOSIS — I10 Essential (primary) hypertension: Secondary | ICD-10-CM | POA: Diagnosis present

## 2014-07-06 DIAGNOSIS — R079 Chest pain, unspecified: Secondary | ICD-10-CM

## 2014-07-06 DIAGNOSIS — I252 Old myocardial infarction: Secondary | ICD-10-CM | POA: Diagnosis not present

## 2014-07-06 DIAGNOSIS — R51 Headache: Secondary | ICD-10-CM | POA: Diagnosis present

## 2014-07-06 DIAGNOSIS — Z8249 Family history of ischemic heart disease and other diseases of the circulatory system: Secondary | ICD-10-CM | POA: Diagnosis not present

## 2014-07-06 DIAGNOSIS — E039 Hypothyroidism, unspecified: Secondary | ICD-10-CM | POA: Diagnosis not present

## 2014-07-06 DIAGNOSIS — Z825 Family history of asthma and other chronic lower respiratory diseases: Secondary | ICD-10-CM

## 2014-07-06 DIAGNOSIS — E785 Hyperlipidemia, unspecified: Secondary | ICD-10-CM | POA: Diagnosis not present

## 2014-07-06 LAB — URINALYSIS, ROUTINE W REFLEX MICROSCOPIC
BILIRUBIN URINE: NEGATIVE
Glucose, UA: NEGATIVE mg/dL
Ketones, ur: NEGATIVE mg/dL
Leukocytes, UA: NEGATIVE
NITRITE: NEGATIVE
Protein, ur: NEGATIVE mg/dL
Specific Gravity, Urine: >1.03 — ABNORMAL HIGH (ref 1.005–1.030)
Urobilinogen, UA: 0.2 mg/dL (ref 0.0–1.0)
pH: 6 (ref 5.0–8.0)

## 2014-07-06 LAB — URINE MICROSCOPIC-ADD ON

## 2014-07-06 LAB — DIFFERENTIAL
Basophils Absolute: 0.1 10*3/uL (ref 0.0–0.1)
Basophils Relative: 0 % (ref 0–1)
EOS PCT: 2 % (ref 0–5)
Eosinophils Absolute: 0.3 10*3/uL (ref 0.0–0.7)
LYMPHS PCT: 53 % — AB (ref 12–46)
Lymphs Abs: 6.5 10*3/uL — ABNORMAL HIGH (ref 0.7–4.0)
MONOS PCT: 6 % (ref 3–12)
Monocytes Absolute: 0.7 10*3/uL (ref 0.1–1.0)
Neutro Abs: 4.7 10*3/uL (ref 1.7–7.7)
Neutrophils Relative %: 39 % — ABNORMAL LOW (ref 43–77)

## 2014-07-06 LAB — CBC
HCT: 39.7 % (ref 36.0–46.0)
HEMOGLOBIN: 13.1 g/dL (ref 12.0–15.0)
MCH: 29.9 pg (ref 26.0–34.0)
MCHC: 33 g/dL (ref 30.0–36.0)
MCV: 90.6 fL (ref 78.0–100.0)
PLATELETS: 299 10*3/uL (ref 150–400)
RBC: 4.38 MIL/uL (ref 3.87–5.11)
RDW: 13.9 % (ref 11.5–15.5)
WBC: 12.2 10*3/uL — ABNORMAL HIGH (ref 4.0–10.5)

## 2014-07-06 LAB — COMPREHENSIVE METABOLIC PANEL
ALT: 19 U/L (ref 0–35)
ANION GAP: 6 (ref 5–15)
AST: 19 U/L (ref 0–37)
Albumin: 3.9 g/dL (ref 3.5–5.2)
Alkaline Phosphatase: 72 U/L (ref 39–117)
BILIRUBIN TOTAL: 0.2 mg/dL — AB (ref 0.3–1.2)
BUN: 17 mg/dL (ref 6–23)
CO2: 24 mmol/L (ref 19–32)
Calcium: 8.5 mg/dL (ref 8.4–10.5)
Chloride: 109 mmol/L (ref 96–112)
Creatinine, Ser: 0.73 mg/dL (ref 0.50–1.10)
GFR calc Af Amer: >90 mL/min (ref >90)
GFR calc non Af Amer: >90 mL/min (ref >90)
GLUCOSE: 88 mg/dL (ref 70–99)
Potassium: 3.7 mmol/L (ref 3.5–5.1)
Sodium: 139 mmol/L (ref 135–145)
Total Protein: 7.4 g/dL (ref 6.0–8.3)

## 2014-07-06 LAB — I-STAT CHEM 8, ED
BUN: 16 mg/dL (ref 6–23)
CALCIUM ION: 1.02 mmol/L — AB (ref 1.12–1.23)
Chloride: 105 mmol/L (ref 96–112)
Creatinine, Ser: 0.7 mg/dL (ref 0.50–1.10)
Glucose, Bld: 89 mg/dL (ref 70–99)
HEMATOCRIT: 43 % (ref 36.0–46.0)
HEMOGLOBIN: 14.6 g/dL (ref 12.0–15.0)
Potassium: 3.8 mmol/L (ref 3.5–5.1)
SODIUM: 141 mmol/L (ref 135–145)
TCO2: 20 mmol/L (ref 0–100)

## 2014-07-06 LAB — ETHANOL: Alcohol, Ethyl (B): 5 mg/dL (ref 0–9)

## 2014-07-06 LAB — RAPID URINE DRUG SCREEN, HOSP PERFORMED
AMPHETAMINES: NOT DETECTED
Barbiturates: NOT DETECTED
Benzodiazepines: NOT DETECTED
Cocaine: NOT DETECTED
Opiates: NOT DETECTED
Tetrahydrocannabinol: NOT DETECTED

## 2014-07-06 LAB — PROTIME-INR
INR: 1.07 (ref 0.00–1.49)
PROTHROMBIN TIME: 14 s (ref 11.6–15.2)

## 2014-07-06 LAB — APTT: APTT: 29 s (ref 24–37)

## 2014-07-06 LAB — TROPONIN I: Troponin I: <0.03 ng/mL (ref <0.031)

## 2014-07-06 MED ORDER — NITROGLYCERIN 0.4 MG SL SUBL
0.4000 mg | SUBLINGUAL_TABLET | SUBLINGUAL | Status: DC | PRN
  Filled 2014-07-06: qty 1

## 2014-07-06 NOTE — ED Notes (Signed)
Pt reports chest pain that going down left arm, pt states she does have a heart hx. Last heart cath in 2011.

## 2014-07-06 NOTE — ED Provider Notes (Signed)
CSN: [id number]     Arrival date & time 07/06/14  2016 History  This chart was scribed for Ezequiel Essex, MD by Jeanell Sparrow, ED Scribe. This patient was seen in room APA06/APA06 and the patient's care was started at 8:31 PM.  Chief Complaint  Patient presents with  . Chest Pain   The history is provided by the patient. No language interpreter was used.   HPI Comments: Katrina [name] is a 54 y.o. female who presents to the Emergency Department complaining of moderate intermittent left sided chest pain that started 2 days ago. She states that the pain has not improved, so she came to the ED. She reports that the episodes of pain usually lasts 10-15 minutes. She states that the pain radiates to her neck and left arm. She reports that movement exacerbates the pain. She reports has been having some left sided weakness, headache, diaphoresis, lightheadedness, nausea, and 10 episodes of diarrhea since yesterday. She states that she has a hx of similar chest pain in past and an MI in 2000. She denies any fever.   Past Medical History  Diagnosis Date  . Type 2 diabetes mellitus   . Essential hypertension   . Hyperlipidemia   . COPD (chronic obstructive pulmonary disease)   . GERD (gastroesophageal reflux disease)   . CAD (coronary artery disease)     Moderate nonobstructive 2012-2013, [place]  . Hypothyroid   . Lung nodules   . Diverticulosis   . Bipolar affective   . Left-sided weakness 03/20/2014    MRI brain negative for stroke.   Past Surgical History  Procedure Laterality Date  . Back surgery    . Abdominal hysterectomy    . Appendectomy    . Tonsillectomy    . Ankle surgery    . Cholecystectomy    . Nose surgery     Family History  Problem Relation Age of Onset  . Coronary artery disease Father 88  . Emphysema Father    History  Substance Use Topics  . Smoking status: Current Every Day Smoker -- 1.00 packs/day for 40 years    Types: Cigarettes  . Smokeless tobacco:  Never Used  . Alcohol Use: No   OB History    No data available     Review of Systems  Cardiovascular: Positive for chest pain.  A complete 10 system review of systems was obtained and all systems are negative except as noted in the HPI and PMH.   Allergies  Penicillins; Pineapple; Strawberry; Poultry meal; and Aspartame and phenylalanine  Home Medications   Prior to Admission medications   Medication Sig Start Date End Date Taking? Authorizing Provider  albuterol (PROVENTIL HFA;VENTOLIN HFA) 108 (90 BASE) MCG/ACT inhaler Inhale 2 puffs into the lungs every 6 (six) hours as needed for wheezing or shortness of breath.   Yes Historical Provider, MD  amLODipine (NORVASC) 10 MG tablet Take 1 tablet (10 mg total) by mouth daily. 06/05/14  Yes Herminio Commons, MD  carisoprodol (SOMA) 350 MG tablet Take 350 mg by mouth 4 (four) times daily.  05/27/14  Yes Historical Provider, MD  dexlansoprazole (DEXILANT) 60 MG capsule Take 1 capsule (60 mg total) by mouth every evening. 03/20/14  Yes Rexene Alberts, MD  furosemide (LASIX) 20 MG tablet Take 1 tablet (20 mg total) by mouth daily as needed for fluid. 03/20/14  Yes Rexene Alberts, MD  gabapentin (NEURONTIN) 600 MG tablet Take 1 tablet (600 mg total) by mouth 3 (three) times daily.  03/20/14  Yes Rexene Alberts, MD  isosorbide mononitrate (IMDUR) 60 MG 24 hr tablet Take 1 tablet (60 mg total) by mouth every evening. 03/20/14  Yes Rexene Alberts, MD  labetalol (NORMODYNE) 300 MG tablet Take 1 tablet (300 mg total) by mouth 2 (two) times daily. 03/20/14  Yes Rexene Alberts, MD  levothyroxine (SYNTHROID, LEVOTHROID) 50 MCG tablet Take 1 tablet (50 mcg total) by mouth at bedtime. 03/20/14  Yes Rexene Alberts, MD  oxyCODONE-acetaminophen (PERCOCET/ROXICET) 5-325 MG per tablet Take 1 tablet by mouth every 4 (four) hours as needed for severe pain (for pain). 03/20/14  Yes Rexene Alberts, MD  rOPINIRole (REQUIP) 3 MG tablet Take 1 tablet (3 mg total) by mouth  at bedtime. 03/20/14  Yes Rexene Alberts, MD  simvastatin (ZOCOR) 20 MG tablet Take 1 tablet (20 mg total) by mouth daily. 04/10/14  Yes Herminio Commons, MD  triamterene-hydrochlorothiazide (DYAZIDE) 37.5-25 MG per capsule Take 1 each (1 capsule total) by mouth every morning. 03/20/14  Yes Rexene Alberts, MD  zolpidem (AMBIEN) 10 MG tablet Take 1 tablet (10 mg total) by mouth at bedtime. 03/20/14  Yes Rexene Alberts, MD  cloNIDine (CATAPRES - DOSED IN MG/24 HR) 0.3 mg/24hr patch Place 1 patch (0.3 mg total) onto the skin once a week. Sundays 03/20/14   Rexene Alberts, MD  EPINEPHrine (EPI-PEN) 0.3 mg/0.3 mL DEVI Inject 0.3 mg into the muscle once.    Historical Provider, MD  lisinopril (PRINIVIL,ZESTRIL) 20 MG tablet Take 1 tablet (20 mg total) by mouth daily. Patient not taking: Reported on 07/06/2014 05/30/14   Verlee Monte, MD   BP 188/86 mmHg  Pulse 88  Resp 20  Ht 5\' 7"  (1.702 m)  Wt 211 lb (95.709 kg)  BMI 33.04 kg/m2  SpO2 100% Physical Exam  Constitutional: She is oriented to person, place, and time. She appears well-developed and well-nourished. No distress.  HENT:  Head: Normocephalic and atraumatic.  Mouth/Throat: Oropharynx is clear and moist. No oropharyngeal exudate.  Eyes: Conjunctivae and EOM are normal. Pupils are equal, round, and reactive to light.  Neck: Normal range of motion. Neck supple.  No meningismus.  Cardiovascular: Normal rate, regular rhythm, normal heart sounds and intact distal pulses.   No murmur heard. Pulmonary/Chest: Effort normal and breath sounds normal. No respiratory distress. She exhibits no tenderness.  Equal breath sounds. Chest has no TTP.   Abdominal: Soft. There is no tenderness. There is no rebound and no guarding.  Musculoskeletal: Normal range of motion. She exhibits no edema or tenderness.  Neurological: She is alert and oriented to person, place, and time. No cranial nerve deficit. She exhibits normal muscle tone. Coordination normal.   4/5 weakness of left arm and leg. No facial drop. Normal speech. No pronator drift.   Skin: Skin is warm.  Psychiatric: She has a normal mood and affect. Her behavior is normal.  Nursing note and vitals reviewed.   ED Course  Procedures (including critical care time) DIAGNOSTIC STUDIES: Oxygen Saturation is 100% on RA, normal by my interpretation.    COORDINATION OF CARE: 8:35 PM- Pt advised of plan for treatment which includes radiology and labs and pt agrees.  Labs Review Labs Reviewed  CBC - Abnormal; Notable for the following:    WBC 12.2 (*)    All other components within normal limits  DIFFERENTIAL - Abnormal; Notable for the following:    Neutrophils Relative % 39 (*)    Lymphocytes Relative 53 (*)    Lymphs Abs 6.5 (*)  All other components within normal limits  COMPREHENSIVE METABOLIC PANEL - Abnormal; Notable for the following:    Total Bilirubin 0.2 (*)    All other components within normal limits  URINALYSIS, ROUTINE W REFLEX MICROSCOPIC - Abnormal; Notable for the following:    Specific Gravity, Urine >1.030 (*)    Hgb urine dipstick LARGE (*)    All other components within normal limits  URINE MICROSCOPIC-ADD ON - Abnormal; Notable for the following:    Bacteria, UA FEW (*)    All other components within normal limits  I-STAT CHEM 8, ED - Abnormal; Notable for the following:    Calcium, Ion 1.02 (*)    All other components within normal limits  ETHANOL  PROTIME-INR  APTT  URINE RAPID DRUG SCREEN (HOSP PERFORMED)  TROPONIN I  I-STAT TROPOININ, ED  I-STAT TROPOININ, ED    Imaging Review Ct Head Wo Contrast  07/06/2014   CLINICAL DATA:  Left-sided headache and hand weakness. History of migraines.  EXAM: CT HEAD WITHOUT CONTRAST  TECHNIQUE: Contiguous axial images were obtained from the base of the skull through the vertex without intravenous contrast.  COMPARISON:  None.  FINDINGS: There is no evidence of mass effect, midline shift or extra-axial fluid  collections. There is no evidence of a space-occupying lesion or intracranial hemorrhage. There is no evidence of a cortical-based area of acute infarction.  The ventricles and sulci are appropriate for the patient's age. The basal cisterns are patent.  Visualized portions of the orbits are unremarkable. The visualized portions of the paranasal sinuses and mastoid air cells are unremarkable.  The osseous structures are unremarkable.  IMPRESSION: Normal CT of the brain without intravenous contrast.   Electronically Signed   By: Kathreen Devoid   On: 07/06/2014 21:40     EKG Interpretation   Date/Time:  Saturday July 06 2014 20:40:55 EST Ventricular Rate:  87 PR Interval:  158 QRS Duration: 92 QT Interval:  389 QTC Calculation: 468 R Axis:   47 Text Interpretation:  Sinus rhythm Probable left atrial enlargement No  significant change was found Confirmed by Wyvonnia Dusky  MD, Harm Jou [phone]) on  07/06/2014 9:08:13 PM      MDM   Final diagnoses:  Chest pain, unspecified chest pain type  Left-sided weakness   presents with 3 day history of left-sided chest pain that comes and goes lasting several minutes at a time. Associated with shortness of breath, diaphoresis and nausea. History of CAD without stents. Recent admission in November with myoview 09/2013 negative for inducible ischemia or significant wall motion abnormalities. EKG unchanged. NSR.   She is admitted in December with similar symptoms. She also endorses left-sided weakness that has been ongoing for the past 3 days as well. Not tPA candidate due to delay in presentation. Previous workup determined this to be psychogenic and she had negative MRIs and CTA.  CT head negative.  Troponin negative.  Chest pain has improved.  Headache persists.  Given her stroke and CAD risk factors, will admit for chest pain rule out as well as CVA rule out. D/w Dr. Maudie Mercury.  I personally performed the services described in this documentation, which was  scribed in my presence. The recorded information has been reviewed and is accurate.   Ezequiel Essex, MD 07/07/14 [phone]

## 2014-07-06 NOTE — Sleep Study (Signed)
Katrina A. Merlene Laughter, MD     [email or web address]        NOCTURNAL POLYSOMNOGRAM    LOCATION: SLEEP LAB FACILITY: [facility]   PHYSICIAN: Katrina [name], M.D.   DATE OF STUDY: 06/23/2014.   REFERRING PHYSICIAN: Zana [name].   INDICATIONS: The patient is a 54 year old presents with snoring, fatigue and difficulty sleeping.  MEDICATIONS:  Prior to Admission medications   Medication Sig Start Date End Date Taking? Authorizing Provider  amLODipine (NORVASC) 10 MG tablet Take 1 tablet (10 mg total) by mouth daily. 06/05/14   Herminio Commons, MD  aspirin EC 81 MG EC tablet Take 1 tablet (81 mg total) by mouth daily. 05/30/14   Verlee Monte, MD  carisoprodol (SOMA) 350 MG tablet Take 350 mg by mouth 4 (four) times daily.  05/27/14   Historical Provider, MD  cloNIDine (CATAPRES - DOSED IN MG/24 HR) 0.3 mg/24hr patch Place 1 patch (0.3 mg total) onto the skin once a week. Sundays 03/20/14   Rexene Alberts, MD  dexlansoprazole (DEXILANT) 60 MG capsule Take 1 capsule (60 mg total) by mouth every evening. 03/20/14   Rexene Alberts, MD  EPINEPHrine (EPI-PEN) 0.3 mg/0.3 mL DEVI Inject 0.3 mg into the muscle once.    Historical Provider, MD  furosemide (LASIX) 20 MG tablet Take 1 tablet (20 mg total) by mouth daily as needed for fluid. 03/20/14   Rexene Alberts, MD  gabapentin (NEURONTIN) 600 MG tablet Take 1 tablet (600 mg total) by mouth 3 (three) times daily. 03/20/14   Rexene Alberts, MD  isosorbide mononitrate (IMDUR) 60 MG 24 hr tablet Take 1 tablet (60 mg total) by mouth every evening. 03/20/14   Rexene Alberts, MD  labetalol (NORMODYNE) 300 MG tablet Take 1 tablet (300 mg total) by mouth 2 (two) times daily. 03/20/14   Rexene Alberts, MD  levothyroxine (SYNTHROID, LEVOTHROID) 50 MCG tablet Take 1 tablet (50 mcg total) by mouth at bedtime. 03/20/14   Rexene Alberts, MD  lisinopril (PRINIVIL,ZESTRIL) 20 MG tablet Take 1 tablet (20 mg total) by mouth daily. 05/30/14   Verlee Monte, MD  oxyCODONE-acetaminophen (PERCOCET/ROXICET) 5-325 MG per tablet Take 1 tablet by mouth every 4 (four) hours as needed for severe pain (for pain). 03/20/14   Rexene Alberts, MD  rOPINIRole (REQUIP) 3 MG tablet Take 1 tablet (3 mg total) by mouth at bedtime. 03/20/14   Rexene Alberts, MD  simvastatin (ZOCOR) 20 MG tablet Take 1 tablet (20 mg total) by mouth daily. 04/10/14   Herminio Commons, MD  triamterene-hydrochlorothiazide (DYAZIDE) 37.5-25 MG per capsule Take 1 each (1 capsule total) by mouth every morning. 03/20/14   Rexene Alberts, MD  zolpidem (AMBIEN) 10 MG tablet Take 1 tablet (10 mg total) by mouth at bedtime. 03/20/14   Rexene Alberts, MD      EPWORTH SLEEPINESS SCALE: 3.   BMI: 33.   ARCHITECTURAL SUMMARY: Total recording time was 396 minutes. Sleep efficiency 98 %. Sleep latency 2 minutes. REM latency 118 minutes. Stage NI 2 %, N2 54 % and N3 25 % and REM sleep 18 %.    RESPIRATORY DATA:  Baseline oxygen saturation is 91 %. The lowest saturation is 86 %. The diagnostic AHI is 3. The RDI is 3. The REM AHI is 5.  LIMB MOVEMENT SUMMARY: PLM index 0.   ELECTROCARDIOGRAM SUMMARY: Average heart rate is 75 with no significant dysrhythmias observed.   IMPRESSION:  1. Unremarkable nocturnal polysomnography.  Thanks for this referral.  Katrina Mandrell A.  Merlene [name], M.D. Diplomat, [employer] adviser of [employer] Medicine.

## 2014-07-06 NOTE — ED Notes (Signed)
Pt. Refusing nitroglycerin.

## 2014-07-07 ENCOUNTER — Ambulatory Visit (HOSPITAL_COMMUNITY): Admission: RE | Admit: 2014-07-07 | Payer: Medicare Other | Source: Ambulatory Visit

## 2014-07-07 ENCOUNTER — Ambulatory Visit (HOSPITAL_COMMUNITY): Payer: Medicare Other

## 2014-07-07 NOTE — Progress Notes (Signed)
Pt arrived to room 316. Stated that she was not staying here. She demanded that her IV be removed. Pt advised that the MD had admitted her for observation. She stated that her BP was fine and her heart was fine and that she can lay at home instead of laying up here. Pt refused to sign AMA papers . IV removed site WNL.

## 2014-07-09 NOTE — Care Management Utilization Note (Signed)
UR completed 

## 2014-07-18 ENCOUNTER — Encounter (HOSPITAL_COMMUNITY): Payer: Self-pay | Admitting: Emergency Medicine

## 2014-07-18 ENCOUNTER — Emergency Department (HOSPITAL_COMMUNITY): Payer: Medicare Other

## 2014-07-18 ENCOUNTER — Emergency Department (HOSPITAL_COMMUNITY)
Admission: EM | Admit: 2014-07-18 | Discharge: 2014-07-18 | Disposition: A | Discharge status: Home / Self Care | Payer: Medicare Other | Attending: Emergency Medicine | Admitting: Emergency Medicine

## 2014-07-18 DIAGNOSIS — R51 Headache: Secondary | ICD-10-CM | POA: Diagnosis not present

## 2014-07-18 DIAGNOSIS — S20302A Unspecified superficial injuries of left front wall of thorax, initial encounter: Secondary | ICD-10-CM | POA: Insufficient documentation

## 2014-07-18 DIAGNOSIS — S199XXA Unspecified injury of neck, initial encounter: Secondary | ICD-10-CM | POA: Diagnosis not present

## 2014-07-18 DIAGNOSIS — W01198A Fall on same level from slipping, tripping and stumbling with subsequent striking against other object, initial encounter: Secondary | ICD-10-CM | POA: Diagnosis not present

## 2014-07-18 DIAGNOSIS — W19XXXA Unspecified fall, initial encounter: Secondary | ICD-10-CM

## 2014-07-18 DIAGNOSIS — E039 Hypothyroidism, unspecified: Secondary | ICD-10-CM | POA: Diagnosis not present

## 2014-07-18 DIAGNOSIS — E785 Hyperlipidemia, unspecified: Secondary | ICD-10-CM | POA: Insufficient documentation

## 2014-07-18 DIAGNOSIS — M542 Cervicalgia: Secondary | ICD-10-CM | POA: Diagnosis not present

## 2014-07-18 DIAGNOSIS — I2510 Atherosclerotic heart disease of native coronary artery without angina pectoris: Secondary | ICD-10-CM | POA: Diagnosis not present

## 2014-07-18 DIAGNOSIS — J449 Chronic obstructive pulmonary disease, unspecified: Secondary | ICD-10-CM | POA: Diagnosis not present

## 2014-07-18 DIAGNOSIS — Z880 Allergy status to penicillin: Secondary | ICD-10-CM | POA: Diagnosis not present

## 2014-07-18 DIAGNOSIS — S299XXA Unspecified injury of thorax, initial encounter: Secondary | ICD-10-CM | POA: Diagnosis not present

## 2014-07-18 DIAGNOSIS — Z79899 Other long term (current) drug therapy: Secondary | ICD-10-CM | POA: Diagnosis not present

## 2014-07-18 DIAGNOSIS — Y9301 Activity, walking, marching and hiking: Secondary | ICD-10-CM | POA: Insufficient documentation

## 2014-07-18 DIAGNOSIS — S0990XA Unspecified injury of head, initial encounter: Secondary | ICD-10-CM | POA: Insufficient documentation

## 2014-07-18 DIAGNOSIS — E119 Type 2 diabetes mellitus without complications: Secondary | ICD-10-CM | POA: Insufficient documentation

## 2014-07-18 DIAGNOSIS — Z720 Tobacco use: Secondary | ICD-10-CM | POA: Diagnosis not present

## 2014-07-18 DIAGNOSIS — F319 Bipolar disorder, unspecified: Secondary | ICD-10-CM | POA: Insufficient documentation

## 2014-07-18 DIAGNOSIS — Y998 Other external cause status: Secondary | ICD-10-CM | POA: Insufficient documentation

## 2014-07-18 DIAGNOSIS — K219 Gastro-esophageal reflux disease without esophagitis: Secondary | ICD-10-CM | POA: Diagnosis not present

## 2014-07-18 DIAGNOSIS — S8991XA Unspecified injury of right lower leg, initial encounter: Secondary | ICD-10-CM | POA: Diagnosis not present

## 2014-07-18 DIAGNOSIS — Y92090 Kitchen in other non-institutional residence as the place of occurrence of the external cause: Secondary | ICD-10-CM | POA: Diagnosis not present

## 2014-07-18 DIAGNOSIS — I10 Essential (primary) hypertension: Secondary | ICD-10-CM | POA: Diagnosis not present

## 2014-07-18 DIAGNOSIS — S3992XA Unspecified injury of lower back, initial encounter: Secondary | ICD-10-CM | POA: Diagnosis not present

## 2014-07-18 DIAGNOSIS — M545 Low back pain: Secondary | ICD-10-CM | POA: Diagnosis not present

## 2014-07-18 DIAGNOSIS — M25561 Pain in right knee: Secondary | ICD-10-CM | POA: Diagnosis not present

## 2014-07-18 LAB — CBC
HCT: 42.1 % (ref 36.0–46.0)
Hemoglobin: 13.7 g/dL (ref 12.0–15.0)
MCH: 29.3 pg (ref 26.0–34.0)
MCHC: 32.5 g/dL (ref 30.0–36.0)
MCV: 90.1 fL (ref 78.0–100.0)
PLATELETS: 252 10*3/uL (ref 150–400)
RBC: 4.67 MIL/uL (ref 3.87–5.11)
RDW: 14.3 % (ref 11.5–15.5)
WBC: 9 10*3/uL (ref 4.0–10.5)

## 2014-07-18 LAB — TROPONIN I: Troponin I: <0.03 ng/mL (ref <0.031)

## 2014-07-18 LAB — BASIC METABOLIC PANEL
Anion gap: 4 — ABNORMAL LOW (ref 5–15)
BUN: 18 mg/dL (ref 6–23)
CALCIUM: 8.7 mg/dL (ref 8.4–10.5)
CO2: 25 mmol/L (ref 19–32)
Chloride: 109 mmol/L (ref 96–112)
Creatinine, Ser: 0.78 mg/dL (ref 0.50–1.10)
GFR calc Af Amer: >90 mL/min (ref >90)
GFR calc non Af Amer: >90 mL/min (ref >90)
GLUCOSE: 86 mg/dL (ref 70–99)
Potassium: 3.9 mmol/L (ref 3.5–5.1)
SODIUM: 138 mmol/L (ref 135–145)

## 2014-07-18 LAB — CBG MONITORING, ED: GLUCOSE-CAPILLARY: 88 mg/dL (ref 70–99)

## 2014-07-18 MED ORDER — CLONIDINE HCL 0.1 MG PO TABS
ORAL_TABLET | ORAL | Status: AC
  Filled 2014-07-18: qty 1

## 2014-07-18 MED ORDER — CLONIDINE HCL 0.1 MG PO TABS
0.1000 mg | ORAL_TABLET | Freq: Once | ORAL | Status: AC
  Administered 2014-07-18: 0.1 mg via ORAL

## 2014-07-18 MED ORDER — LABETALOL HCL 5 MG/ML IV SOLN
20.0000 mg | Freq: Once | INTRAVENOUS | Status: DC
  Filled 2014-07-18: qty 4

## 2014-07-18 NOTE — ED Provider Notes (Signed)
CSN: [id number]     Arrival date & time 07/18/14  1015 History  This chart was scribed for Katrina Essex, MD by Stephania Fragmin, ED Scribe. This patient was seen in room APA07/APA07 and the patient's care was started at 11:12 AM.    Chief Complaint  Patient presents with  . Fall  . Loss of Consciousness   The history is provided by the patient. No language interpreter was used.     HPI Comments: Katrina [name] is a 54 y.o. female who presents to the Emergency Department S/P a fall witnessed by her granddaughter that occurred this morning in the kitchen when she tripped and fell, hitting her refrigerator and concrete. She reports a head injury. Patient states she was feeling fine before the fall. She complains of associated pain in her head, right hip, right sided back, and right knee. Patient was seen by me about 12 days ago for episodes of intermittent sharp chest pain; she reports she still feels it for 10-15 seconds at a time, and last felt it about 5 minutes ago. She also reports left sided weakness unchanged since October.  She denies blood thinner or aspirin use. She denies SOB or abdominal pain.   Past Medical History  Diagnosis Date  . Type 2 diabetes mellitus   . Essential hypertension   . Hyperlipidemia   . COPD (chronic obstructive pulmonary disease)   . GERD (gastroesophageal reflux disease)   . CAD (coronary artery disease)     Moderate nonobstructive 2012-2013, [place]  . Hypothyroid   . Lung nodules   . Diverticulosis   . Bipolar affective   . Left-sided weakness 03/20/2014    MRI brain negative for stroke.   Past Surgical History  Procedure Laterality Date  . Back surgery    . Abdominal hysterectomy    . Appendectomy    . Tonsillectomy    . Ankle surgery    . Cholecystectomy    . Nose surgery     Family History  Problem Relation Age of Onset  . Coronary artery disease Father 78  . Emphysema Father    History  Substance Use Topics  . Smoking status: Current  Every Day Smoker -- 1.00 packs/day for 40 years    Types: Cigarettes  . Smokeless tobacco: Never Used  . Alcohol Use: No   OB History    No data available     Review of Systems A complete 10 system review of systems was obtained and all systems are negative except as noted in the HPI and PMH.     Allergies  Penicillins; Pineapple; Strawberry; Poultry meal; and Aspartame and phenylalanine  Home Medications   Prior to Admission medications   Medication Sig Start Date End Date Taking? Authorizing Provider  amLODipine (NORVASC) 10 MG tablet Take 1 tablet (10 mg total) by mouth daily. 06/05/14  Yes Herminio Commons, MD  aspirin-acetaminophen-caffeine (EXCEDRIN MIGRAINE) [phone] MG per tablet Take 2 tablets by mouth every 6 (six) hours as needed for headache.   Yes Historical Provider, MD  carisoprodol (SOMA) 350 MG tablet Take 350 mg by mouth 4 (four) times daily.  05/27/14  Yes Historical Provider, MD  cloNIDine (CATAPRES - DOSED IN MG/24 HR) 0.3 mg/24hr patch Place 1 patch (0.3 mg total) onto the skin once a week. Sundays 03/20/14  Yes Rexene Alberts, MD  dexlansoprazole (DEXILANT) 60 MG capsule Take 1 capsule (60 mg total) by mouth every evening. 03/20/14  Yes Rexene Alberts, MD  EPINEPHrine (EPI-PEN)  0.3 mg/0.3 mL DEVI Inject 0.3 mg into the muscle once as needed (allergic reaction).    Yes Historical Provider, MD  gabapentin (NEURONTIN) 600 MG tablet Take 1 tablet (600 mg total) by mouth 3 (three) times daily. 03/20/14  Yes Rexene Alberts, MD  labetalol (NORMODYNE) 300 MG tablet Take 1 tablet (300 mg total) by mouth 2 (two) times daily. 03/20/14  Yes Rexene Alberts, MD  rOPINIRole (REQUIP) 3 MG tablet Take 1 tablet (3 mg total) by mouth at bedtime. 03/20/14  Yes Rexene Alberts, MD  triamterene-hydrochlorothiazide (DYAZIDE) 37.5-25 MG per capsule Take 1 each (1 capsule total) by mouth every morning. 03/20/14  Yes Rexene Alberts, MD  zolpidem (AMBIEN) 10 MG tablet Take 1 tablet (10 mg  total) by mouth at bedtime. 03/20/14  Yes Rexene Alberts, MD  furosemide (LASIX) 20 MG tablet Take 1 tablet (20 mg total) by mouth daily as needed for fluid. Patient not taking: Reported on 07/18/2014 03/20/14   Rexene Alberts, MD  isosorbide mononitrate (IMDUR) 60 MG 24 hr tablet Take 1 tablet (60 mg total) by mouth every evening. Patient not taking: Reported on 07/18/2014 03/20/14   Rexene Alberts, MD  levothyroxine (SYNTHROID, LEVOTHROID) 50 MCG tablet Take 1 tablet (50 mcg total) by mouth at bedtime. Patient not taking: Reported on 07/18/2014 03/20/14   Rexene Alberts, MD  lisinopril (PRINIVIL,ZESTRIL) 20 MG tablet Take 1 tablet (20 mg total) by mouth daily. Patient not taking: Reported on 07/06/2014 05/30/14   Verlee Monte, MD  oxyCODONE-acetaminophen (PERCOCET/ROXICET) 5-325 MG per tablet Take 1 tablet by mouth every 4 (four) hours as needed for severe pain (for pain). Patient not taking: Reported on 07/18/2014 03/20/14   Rexene Alberts, MD  simvastatin (ZOCOR) 20 MG tablet Take 1 tablet (20 mg total) by mouth daily. Patient not taking: Reported on 07/18/2014 04/10/14   Herminio Commons, MD   BP 197/93 mmHg  Pulse 64  Temp(Src) 98.1 F (36.7 C) (Oral)  Resp 25  Ht 5\' 7"  (1.702 m)  Wt 211 lb (95.709 kg)  BMI 33.04 kg/m2  SpO2 97% Physical Exam  Constitutional: She is oriented to person, place, and time. She appears well-developed and well-nourished. No distress.  HENT:  Head: Normocephalic and atraumatic.  Mouth/Throat: Oropharynx is clear and moist. No oropharyngeal exudate.  Eyes: Conjunctivae and EOM are normal. Pupils are equal, round, and reactive to light.  Neck: Normal range of motion. Neck supple.  No C spine tenderness  Cardiovascular: Normal rate, regular rhythm, normal heart sounds and intact distal pulses.   No murmur heard. Pulmonary/Chest: Effort normal and breath sounds normal. No respiratory distress. She exhibits tenderness.  L chest wall tendernes  Abdominal: Soft.  There is no tenderness. There is no rebound and no guarding.  Musculoskeletal: Normal range of motion. She exhibits tenderness. She exhibits no edema.  TTP R anterior knee. No effusion.  Flexion and extesion intact.   Paraspinal lumbar pain  Neurological: She is alert and oriented to person, place, and time. No cranial nerve deficit. She exhibits normal muscle tone. Coordination normal.  No ataxia on finger to nose bilaterally. No pronator drift. 5/5 strength throughout. CN 2-12 intact. Negative Romberg. Equal grip strength. Sensation intact. Gait is normal.   Skin: Skin is warm.  Psychiatric: She has a normal mood and affect. Her behavior is normal.  Nursing note and vitals reviewed.   ED Course  Procedures (including critical care time)  DIAGNOSTIC STUDIES: Oxygen Saturation is 100% on room air, normal by my interpretation.  COORDINATION OF CARE: 11:21 AM - Discussed treatment plan with pt at bedside which includes tests, and pt agreed to plan.   Labs Review Labs Reviewed  BASIC METABOLIC PANEL - Abnormal; Notable for the following:    Anion gap 4 (*)    All other components within normal limits  CBC  TROPONIN I  TROPONIN I  CBG MONITORING, ED    Imaging Review Dg Chest 2 View  07/18/2014   CLINICAL DATA:  Status post fall this morning with loss of consciousness ; history of COPD, coronary artery disease, lung nodules, and diabetes.  EXAM: CHEST  2 VIEW  COMPARISON:  AP chest x-ray dated May 29, 2014  FINDINGS: The lungs are adequately inflated and clear. There is no pneumothorax or pleural effusion. The heart and pulmonary vascularity are normal. The mediastinum is normal in width. The bony thorax exhibits no acute abnormality.  IMPRESSION: There is no active cardiopulmonary disease.   Electronically Signed   By: David  Martinique   On: 07/18/2014 12:49   Dg Lumbar Spine Complete  07/18/2014   CLINICAL DATA:  Status post fall at home today with low back pain; history of  previous low lumbar surgery  EXAM: LUMBAR SPINE - COMPLETE 4+ VIEW  COMPARISON:  Lumbar spine series of February 27, 2014  FINDINGS: The lumbar vertebral bodies are preserved in height. The patient has undergone previous posterior fusion at L5-S1 with placement of an interdiscal device. The metallic hardware is intact and unchanged in position. The other lumbar disc space heights are well maintained. There is no spondylolisthesis. There is mild facet joint hypertrophy at L4-5. The pedicles and transverse processes are intact where visualized. There are surgical clips in the gallbladder fossa.  IMPRESSION: There is no acute bony abnormality of the lumbar spine. The fusion at L5-S1 hardware is intact.   Electronically Signed   By: David  Martinique   On: 07/18/2014 12:47   Ct Head Wo Contrast  07/18/2014   CLINICAL DATA:  Patient fell in kitchen with loss of consciousness and pain.  EXAM: CT HEAD WITHOUT CONTRAST  CT CERVICAL SPINE WITHOUT CONTRAST  TECHNIQUE: Multidetector CT imaging of the head and cervical spine was performed following the standard protocol without intravenous contrast. Multiplanar CT image reconstructions of the cervical spine were also generated.  COMPARISON:  Head CT July 06, 2014  FINDINGS: CT HEAD FINDINGS  The ventricles are normal in size and configuration. There is no mass, hemorrhage, extra-axial fluid collection, or midline shift. Gray-white compartments are normal. No acute infarct. Bony calvarium appears intact. Mastoid air cells are clear.  CT CERVICAL SPINE FINDINGS  There is no fracture or spondylolisthesis. Prevertebral soft tissues and predental space regions are normal. There is mild disc space narrowing at C5-6 and C6-7. There is exit foraminal narrowing at C5-6 on the right and at C6-7 bilaterally. There is no disc extrusion or stenosis. There are scattered foci of carotid artery calcification bilaterally.  IMPRESSION: CT head: Study within normal limits. No mass, hemorrhage,  or extra-axial fluid. Gray-white compartments normal.  CT cervical spine: Areas of osteoarthritic change. No fracture or spondylolisthesis scattered foci of carotid artery calcification bilaterally.   Electronically Signed   By: Lowella Grip III M.D.   On: 07/18/2014 12:30   Ct Cervical Spine Wo Contrast  07/18/2014   CLINICAL DATA:  Patient fell in kitchen with loss of consciousness and pain.  EXAM: CT HEAD WITHOUT CONTRAST  CT CERVICAL SPINE WITHOUT CONTRAST  TECHNIQUE: Multidetector CT  imaging of the head and cervical spine was performed following the standard protocol without intravenous contrast. Multiplanar CT image reconstructions of the cervical spine were also generated.  COMPARISON:  Head CT July 06, 2014  FINDINGS: CT HEAD FINDINGS  The ventricles are normal in size and configuration. There is no mass, hemorrhage, extra-axial fluid collection, or midline shift. Gray-white compartments are normal. No acute infarct. Bony calvarium appears intact. Mastoid air cells are clear.  CT CERVICAL SPINE FINDINGS  There is no fracture or spondylolisthesis. Prevertebral soft tissues and predental space regions are normal. There is mild disc space narrowing at C5-6 and C6-7. There is exit foraminal narrowing at C5-6 on the right and at C6-7 bilaterally. There is no disc extrusion or stenosis. There are scattered foci of carotid artery calcification bilaterally.  IMPRESSION: CT head: Study within normal limits. No mass, hemorrhage, or extra-axial fluid. Gray-white compartments normal.  CT cervical spine: Areas of osteoarthritic change. No fracture or spondylolisthesis scattered foci of carotid artery calcification bilaterally.   Electronically Signed   By: Lowella Grip III M.D.   On: 07/18/2014 12:30   Dg Knee Complete 4 Views Right  07/18/2014   CLINICAL DATA:  Status post fall indication today with loss of consciousness with low back and right knee pain  EXAM: RIGHT KNEE - COMPLETE 4+ VIEW   COMPARISON:  Right knee series of February 27, 2014  FINDINGS: The bones of the right knee are adequately mineralized. There is no acute fracture nor dislocation. The joint spaces appear well maintained. There is minimal beaking of the tibial spines. The proximal fibula is intact. There is no joint effusion.  IMPRESSION: There is no acute bony abnormality of the right knee.   Electronically Signed   By: David  Martinique   On: 07/18/2014 12:45     EKG Interpretation   Date/Time:  Thursday July 18 2014 11:22:49 EST Ventricular Rate:  74 PR Interval:  170 QRS Duration: 89 QT Interval:  407 QTC Calculation: 451 R Axis:   46 Text Interpretation:  Sinus rhythm Baseline wander in lead(s) V3 No  significant change was found Confirmed by Wyvonnia Dusky  MD, Naw Lasala [phone]) on  07/18/2014 11:33:19 AM      MDM   Final diagnoses:  Fall, initial encounter  Head injury, initial encounter    patient reports mechanical fall this morning while she was walking around her table. States she hit her head and lost consciousness. Denies any dizziness or lightheadedness preceding fall. No focal weakness, numbness or tingling. She does not take any anticoagulants.   EKG is unchanged.  patient left AMA from a chest pain admission last week. She reports that she occasionally gets chest pain in the left side of her chest lasting a few seconds at a time but never longer than 15 seconds. This is not similar to her previous MI.  CT head and C-spine negative. Chest x-ray negative.   imaging is negative for acute traumatic injury. Troponin is negative. Reassuring stress test in April 2015.    Patient is tolerating PO and ambulatory. Repeat troponin pending at time of sign out to Dr. Alvino Chapel.    I personally performed the services described in this documentation, which was scribed in my presence. The recorded information has been reviewed and is accurate.   Katrina Essex, MD 07/18/14 1730

## 2014-07-18 NOTE — ED Notes (Signed)
Pt ambulated to door of room and back to bed. Complains of back pain

## 2014-07-18 NOTE — ED Notes (Signed)
Pt states that she fell in the kitchen this morning with loss of consciousness.  States head hurts as well as entire right side.  States trip caused initial fall.

## 2014-07-18 NOTE — Discharge Instructions (Signed)
Head Injury Your CT scans and Xrays are negative for acute traumatic injury. Follow up with your doctor as scheduled. Return to the ED if you develop new or worsening symptoms. You have received a head injury. It does not appear serious at this time. Headaches and vomiting are common following head injury. It should be easy to awaken from sleeping. Sometimes it is necessary for you to stay in the emergency department for a while for observation. Sometimes admission to the hospital may be needed. After injuries such as yours, most problems occur within the first 24 hours, but side effects may occur up to 7-10 days after the injury. It is important for you to carefully monitor your condition and contact your health care provider or seek immediate medical care if there is a change in your condition. WHAT ARE THE TYPES OF HEAD INJURIES? Head injuries can be as minor as a bump. Some head injuries can be more severe. More severe head injuries include:  A jarring injury to the brain (concussion).  A bruise of the brain (contusion). This mean there is bleeding in the brain that can cause swelling.  A cracked skull (skull fracture).  Bleeding in the brain that collects, clots, and forms a bump (hematoma). WHAT CAUSES A HEAD INJURY? A serious head injury is most likely to happen to someone who is in a car wreck and is not wearing a seat belt. Other causes of major head injuries include bicycle or motorcycle accidents, sports injuries, and falls. HOW ARE HEAD INJURIES DIAGNOSED? A complete history of the event leading to the injury and your current symptoms will be helpful in diagnosing head injuries. Many times, pictures of the brain, such as CT or MRI are needed to see the extent of the injury. Often, an overnight hospital stay is necessary for observation.  WHEN SHOULD I SEEK IMMEDIATE MEDICAL CARE?  You should get help right away if:  You have confusion or drowsiness.  You feel sick to your stomach  (nauseous) or have continued, forceful vomiting.  You have dizziness or unsteadiness that is getting worse.  You have severe, continued headaches not relieved by medicine. Only take over-the-counter or prescription medicines for pain, fever, or discomfort as directed by your health care provider.  You do not have normal function of the arms or legs or are unable to walk.  You notice changes in the black spots in the center of the colored part of your eye (pupil).  You have a clear or bloody fluid coming from your nose or ears.  You have a loss of vision. During the next 24 hours after the injury, you must stay with someone who can watch you for the warning signs. This person should contact local emergency services (911 in the [place] if you have seizures, you become unconscious, or you are unable to wake up. HOW CAN I PREVENT A HEAD INJURY IN THE FUTURE? The most important factor for preventing major head injuries is avoiding motor vehicle accidents. To minimize the potential for damage to your head, it is crucial to wear seat belts while riding in motor vehicles. Wearing helmets while bike riding and playing collision sports (like football) is also helpful. Also, avoiding dangerous activities around the house will further help reduce your risk of head injury.  WHEN CAN I RETURN TO NORMAL ACTIVITIES AND ATHLETICS? You should be reevaluated by your health care provider before returning to these activities. If you have any of the following symptoms, you should not  return to activities or contact sports until 1 week after the symptoms have stopped:  Persistent headache.  Dizziness or vertigo.  Poor attention and concentration.  Confusion.  Memory problems.  Nausea or vomiting.  Fatigue or tire easily.  Irritability.  Intolerant of bright lights or loud noises.  Anxiety or depression.  Disturbed sleep. MAKE SURE YOU:   Understand these instructions.  Will watch your  condition.  Will get help right away if you are not doing well or get worse. Document Released: 05/24/2005 Document Revised: 05/29/2013 Document Reviewed: 01/29/2013 [facility] Patient Information 2015 [place], [place]. This information is not intended to replace advice given to you by your health care provider. Make sure you discuss any questions you have with your health care provider.

## 2014-07-18 NOTE — ED Notes (Signed)
Pt becoming agitated and verbally aggressive with Lab. EDP aware and at pt bedside.

## 2014-07-18 NOTE — ED Notes (Signed)
Attempted IV access with no success. Lab to obtain blood work. nad noted.

## 2014-07-18 NOTE — ED Notes (Signed)
Pt drinking soda from home and tolerating well.

## 2014-07-18 NOTE — ED Notes (Addendum)
Pt left agitated after reviewing d/c instructions. Pt left without signing or allowing d/c v/s to be obtained. nad noted. Pt ambulated out of room with steady gait. Pt reports, pt daughter was driving her home.
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
## 2014-07-25 DIAGNOSIS — F314 Bipolar disorder, current episode depressed, severe, without psychotic features: Secondary | ICD-10-CM | POA: Diagnosis not present

## 2014-08-12 ENCOUNTER — Ambulatory Visit: Payer: Medicare Other | Admitting: Cardiovascular Disease

## 2014-08-12 ENCOUNTER — Encounter: Payer: Self-pay | Admitting: Cardiovascular Disease

## 2014-09-04 DIAGNOSIS — Z885 Allergy status to narcotic agent status: Secondary | ICD-10-CM | POA: Diagnosis not present

## 2014-09-04 DIAGNOSIS — Z79899 Other long term (current) drug therapy: Secondary | ICD-10-CM | POA: Diagnosis not present

## 2014-09-04 DIAGNOSIS — E119 Type 2 diabetes mellitus without complications: Secondary | ICD-10-CM | POA: Diagnosis not present

## 2014-09-04 DIAGNOSIS — G2581 Restless legs syndrome: Secondary | ICD-10-CM | POA: Diagnosis not present

## 2014-09-04 DIAGNOSIS — R072 Precordial pain: Secondary | ICD-10-CM | POA: Diagnosis not present

## 2014-09-04 DIAGNOSIS — Z886 Allergy status to analgesic agent status: Secondary | ICD-10-CM | POA: Diagnosis not present

## 2014-09-04 DIAGNOSIS — E039 Hypothyroidism, unspecified: Secondary | ICD-10-CM | POA: Diagnosis not present

## 2014-09-04 DIAGNOSIS — R079 Chest pain, unspecified: Secondary | ICD-10-CM | POA: Diagnosis not present

## 2014-09-04 DIAGNOSIS — I10 Essential (primary) hypertension: Secondary | ICD-10-CM | POA: Diagnosis not present

## 2014-09-13 DIAGNOSIS — J45909 Unspecified asthma, uncomplicated: Secondary | ICD-10-CM | POA: Diagnosis not present

## 2014-09-13 DIAGNOSIS — I10 Essential (primary) hypertension: Secondary | ICD-10-CM | POA: Diagnosis not present

## 2014-09-13 DIAGNOSIS — J309 Allergic rhinitis, unspecified: Secondary | ICD-10-CM | POA: Diagnosis not present

## 2014-09-13 DIAGNOSIS — I2510 Atherosclerotic heart disease of native coronary artery without angina pectoris: Secondary | ICD-10-CM | POA: Diagnosis not present

## 2014-09-16 ENCOUNTER — Emergency Department (HOSPITAL_COMMUNITY)
Admission: EM | Admit: 2014-09-16 | Discharge: 2014-09-17 | Disposition: A | Discharge status: Home / Self Care | Payer: Medicare Other | Attending: Emergency Medicine | Admitting: Emergency Medicine

## 2014-09-16 DIAGNOSIS — I2510 Atherosclerotic heart disease of native coronary artery without angina pectoris: Secondary | ICD-10-CM | POA: Diagnosis not present

## 2014-09-16 DIAGNOSIS — F319 Bipolar disorder, unspecified: Secondary | ICD-10-CM | POA: Diagnosis not present

## 2014-09-16 DIAGNOSIS — R251 Tremor, unspecified: Secondary | ICD-10-CM

## 2014-09-16 DIAGNOSIS — K219 Gastro-esophageal reflux disease without esophagitis: Secondary | ICD-10-CM | POA: Diagnosis not present

## 2014-09-16 DIAGNOSIS — E785 Hyperlipidemia, unspecified: Secondary | ICD-10-CM | POA: Diagnosis not present

## 2014-09-16 DIAGNOSIS — R079 Chest pain, unspecified: Secondary | ICD-10-CM | POA: Insufficient documentation

## 2014-09-16 DIAGNOSIS — Z7982 Long term (current) use of aspirin: Secondary | ICD-10-CM | POA: Diagnosis not present

## 2014-09-16 DIAGNOSIS — R000 Tachycardia, unspecified: Secondary | ICD-10-CM | POA: Diagnosis not present

## 2014-09-16 DIAGNOSIS — R112 Nausea with vomiting, unspecified: Secondary | ICD-10-CM | POA: Diagnosis not present

## 2014-09-16 DIAGNOSIS — J441 Chronic obstructive pulmonary disease with (acute) exacerbation: Secondary | ICD-10-CM | POA: Insufficient documentation

## 2014-09-16 DIAGNOSIS — I10 Essential (primary) hypertension: Secondary | ICD-10-CM | POA: Diagnosis not present

## 2014-09-16 DIAGNOSIS — Z880 Allergy status to penicillin: Secondary | ICD-10-CM | POA: Diagnosis not present

## 2014-09-16 DIAGNOSIS — E039 Hypothyroidism, unspecified: Secondary | ICD-10-CM | POA: Insufficient documentation

## 2014-09-16 DIAGNOSIS — R0602 Shortness of breath: Secondary | ICD-10-CM | POA: Diagnosis not present

## 2014-09-16 DIAGNOSIS — Z77098 Contact with and (suspected) exposure to other hazardous, chiefly nonmedicinal, chemicals: Secondary | ICD-10-CM

## 2014-09-16 DIAGNOSIS — Z720 Tobacco use: Secondary | ICD-10-CM | POA: Diagnosis not present

## 2014-09-16 DIAGNOSIS — Z79899 Other long term (current) drug therapy: Secondary | ICD-10-CM | POA: Diagnosis not present

## 2014-09-16 DIAGNOSIS — E119 Type 2 diabetes mellitus without complications: Secondary | ICD-10-CM | POA: Insufficient documentation

## 2014-09-16 DIAGNOSIS — M6281 Muscle weakness (generalized): Secondary | ICD-10-CM | POA: Insufficient documentation

## 2014-09-16 LAB — CBG MONITORING, ED: Glucose-Capillary: 121 mg/dL — ABNORMAL HIGH (ref 70–99)

## 2014-09-16 NOTE — ED Provider Notes (Addendum)
CSN: [id number]     Arrival date & time 09/16/14  2327 History  This chart was scribed for Shanon Rosser, MD by Hilda Lias, ED Scribe. This patient was seen in room APA03/APA03 and the patient's care was started at 11:55 PM.    Chief Complaint  Patient presents with  . Shaking      The history is provided by the patient and a relative. No language interpreter was used.    HPI Comments: Katrina [name] is a 54 y.o. female with a history of left upper extremity weakness since October of last year; workup was negative and there was a question of whether her symptomatology was embellished. She has had subsequent negative MRIs as well. Symptoms continue to wax and wane.  She presents to the Emergency Department complaining of a sudden onset of shaking with associated chest pain, shortness of breath, nausea, vomiting and generalized weakness and body aches that has been present for an hour. Her daughter states that she found pt shaking at home with blankets wrapped around her. She has never had a seizure before. Daughter reports she began to vomit upon arrival to ED. Pt notes that her chest pain began 30 minutes ago and consists of a sharp, fleeting pains lasting seconds. She reports seeing double, which she describes as "a glare" or "a shadow". She also has a left facial droop which her daughter states is chronic and worsens in times of stress.    Past Medical History  Diagnosis Date  . Type 2 diabetes mellitus   . Essential hypertension   . Hyperlipidemia   . COPD (chronic obstructive pulmonary disease)   . GERD (gastroesophageal reflux disease)   . CAD (coronary artery disease)     Moderate nonobstructive 2012-2013, [place]  . Hypothyroid   . Lung nodules   . Diverticulosis   . Bipolar affective   . Left-sided weakness 03/20/2014    MRI brain negative for stroke.   Past Surgical History  Procedure Laterality Date  . Back surgery    . Abdominal hysterectomy    . Appendectomy     . Tonsillectomy    . Ankle surgery    . Cholecystectomy    . Nose surgery     Family History  Problem Relation Age of Onset  . Coronary artery disease Father 35  . Emphysema Father    History  Substance Use Topics  . Smoking status: Current Every Day Smoker -- 1.00 packs/day for 40 years    Types: Cigarettes  . Smokeless tobacco: Never Used  . Alcohol Use: No   OB History    No data available     Review of Systems  All other systems reviewed and are negative.   Allergies  Penicillins; Pineapple; Strawberry; Poultry meal; and Aspartame and phenylalanine  Home Medications   Prior to Admission medications   Medication Sig Start Date End Date Taking? Authorizing Provider  amLODipine (NORVASC) 10 MG tablet Take 1 tablet (10 mg total) by mouth daily. 06/05/14   Herminio Commons, MD  aspirin-acetaminophen-caffeine (EXCEDRIN MIGRAINE) [phone] MG per tablet Take 2 tablets by mouth every 6 (six) hours as needed for headache.    Historical Provider, MD  carisoprodol (SOMA) 350 MG tablet Take 350 mg by mouth 4 (four) times daily.  05/27/14   Historical Provider, MD  cloNIDine (CATAPRES - DOSED IN MG/24 HR) 0.3 mg/24hr patch Place 1 patch (0.3 mg total) onto the skin once a week. Sundays 03/20/14   Rexene Alberts,  MD  dexlansoprazole (DEXILANT) 60 MG capsule Take 1 capsule (60 mg total) by mouth every evening. 03/20/14   Rexene Alberts, MD  EPINEPHrine (EPI-PEN) 0.3 mg/0.3 mL DEVI Inject 0.3 mg into the muscle once as needed (allergic reaction).     Historical Provider, MD  furosemide (LASIX) 20 MG tablet Take 1 tablet (20 mg total) by mouth daily as needed for fluid. Patient not taking: Reported on 07/18/2014 03/20/14   Rexene Alberts, MD  gabapentin (NEURONTIN) 600 MG tablet Take 1 tablet (600 mg total) by mouth 3 (three) times daily. 03/20/14   Rexene Alberts, MD  isosorbide mononitrate (IMDUR) 60 MG 24 hr tablet Take 1 tablet (60 mg total) by mouth every evening. Patient not  taking: Reported on 07/18/2014 03/20/14   Rexene Alberts, MD  labetalol (NORMODYNE) 300 MG tablet Take 1 tablet (300 mg total) by mouth 2 (two) times daily. 03/20/14   Rexene Alberts, MD  levothyroxine (SYNTHROID, LEVOTHROID) 50 MCG tablet Take 1 tablet (50 mcg total) by mouth at bedtime. Patient not taking: Reported on 07/18/2014 03/20/14   Rexene Alberts, MD  lisinopril (PRINIVIL,ZESTRIL) 20 MG tablet Take 1 tablet (20 mg total) by mouth daily. Patient not taking: Reported on 07/06/2014 05/30/14   Verlee Monte, MD  oxyCODONE-acetaminophen (PERCOCET/ROXICET) 5-325 MG per tablet Take 1 tablet by mouth every 4 (four) hours as needed for severe pain (for pain). Patient not taking: Reported on 07/18/2014 03/20/14   Rexene Alberts, MD  rOPINIRole (REQUIP) 3 MG tablet Take 1 tablet (3 mg total) by mouth at bedtime. 03/20/14   Rexene Alberts, MD  simvastatin (ZOCOR) 20 MG tablet Take 1 tablet (20 mg total) by mouth daily. Patient not taking: Reported on 07/18/2014 04/10/14   Herminio Commons, MD  triamterene-hydrochlorothiazide (DYAZIDE) 37.5-25 MG per capsule Take 1 each (1 capsule total) by mouth every morning. 03/20/14   Rexene Alberts, MD  zolpidem (AMBIEN) 10 MG tablet Take 1 tablet (10 mg total) by mouth at bedtime. 03/20/14   Rexene Alberts, MD   BP 177/92 mmHg  Pulse 87  Temp(Src) 98 F (36.7 C) (Oral)  Resp 28  Ht 5\' 7"  (1.702 m)  Wt 216 lb (97.977 kg)  BMI 33.82 kg/m2  SpO2 98%   Physical Exam General: Well-developed, well-nourished female in no acute distress; appearance consistent with age of record HENT: normocephalic; atraumatic Eyes: pupils equal, round and reactive to light; extraocular muscles grossly intact but limited exam due to poor effort; bilateral monocular diplopia Neck: supple Heart: regular rate and rhythm; tachycardia Lungs: Mildly decreased air movement bilaterally; stridor Abdomen: soft; nondistended; nontender; no masses or hepatosplenomegaly; bowel sounds  present Extremities: No deformity; full range of motion; pulses normal Neurologic: Awake, alert and oriented; generalized tremor most prominent in the RUE; strength +4/5 in all extremities; left facial droop; difficulty protruding her tongue; N.B.: noted to move BUE and BLE without difficultly or tremor when not aware of being observed Skin: Warm and dry Psychiatric: Anxious  ED Course  Procedures (including critical care time)  DIAGNOSTIC STUDIES: Oxygen Saturation is 96% on room air, normal by my interpretation.    COORDINATION OF CARE: 12:03 AM Discussed treatment plan with pt at bedside and pt agreed to plan.    MDM   Nursing notes and vitals signs, including pulse oximetry, reviewed.  Summary of this visit's results, reviewed by myself:  Labs:  Results for orders placed or performed during the hospital encounter of 09/16/14 (from the past 24 hour(s))  CBG monitoring, ED  Status: Abnormal   Collection Time: 09/16/14 11:44 PM  Result Value Ref Range   Glucose-Capillary 121 (H) 70 - 99 mg/dL  CBC with Differential/Platelet     Status: Abnormal   Collection Time: 09/17/14 12:26 AM  Result Value Ref Range   WBC 11.9 (H) 4.0 - 10.5 K/uL   RBC 4.43 3.87 - 5.11 MIL/uL   Hemoglobin 13.2 12.0 - 15.0 g/dL   HCT 39.4 36.0 - 46.0 %   MCV 88.9 78.0 - 100.0 fL   MCH 29.8 26.0 - 34.0 pg   MCHC 33.5 30.0 - 36.0 g/dL   RDW 14.1 11.5 - 15.5 %   Platelets 280 150 - 400 K/uL   Neutrophils Relative % 45 43 - 77 %   Neutro Abs 5.4 1.7 - 7.7 K/uL   Lymphocytes Relative 46 12 - 46 %   Lymphs Abs 5.5 (H) 0.7 - 4.0 K/uL   Monocytes Relative 7 3 - 12 %   Monocytes Absolute 0.8 0.1 - 1.0 K/uL   Eosinophils Relative 2 0 - 5 %   Eosinophils Absolute 0.2 0.0 - 0.7 K/uL   Basophils Relative 0 0 - 1 %   Basophils Absolute 0.1 0.0 - 0.1 K/uL  Basic metabolic panel     Status: Abnormal   Collection Time: 09/17/14 12:26 AM  Result Value Ref Range   Sodium 140 135 - 145 mmol/L   Potassium  3.6 3.5 - 5.1 mmol/L   Chloride 108 96 - 112 mmol/L   CO2 22 19 - 32 mmol/L   Glucose, Bld 106 (H) 70 - 99 mg/dL   BUN 17 6 - 23 mg/dL   Creatinine, Ser 0.93 0.50 - 1.10 mg/dL   Calcium 9.1 8.4 - 10.5 mg/dL   GFR calc non Af Amer 69 (L) >90 mL/min   GFR calc Af Amer 80 (L) >90 mL/min   Anion gap 10 5 - 15  Troponin I     Status: None   Collection Time: 09/17/14 12:26 AM  Result Value Ref Range   Troponin I <0.03 <0.031 ng/mL  Troponin I     Status: None   Collection Time: 09/17/14  3:47 AM  Result Value Ref Range   Troponin I <0.03 <0.031 ng/mL    1:01 AM Patient now reports that her symptoms came on when she was in the basement painting the floor with a concrete sealant. Her daughter states that the fumes very strong, permeating the entire house. The patient now complains of feeling like all of her insides want to come out. She longer has a facial droop. Stridor improved. Valium 5mg  IV given.  3:33 AM Tachycardia has resolved tachypnea is improved after IV Valium. The patient's lungs are clear and her stridor has resolved. She still complains of tremor and generalized malaise. The patient is insisting on leaving but has agreed to a repeat troponin.  4:25 AM Tremor much improved. Patient feels better, is now smiling. She has follow-up appointment already scheduled with Dr. Merlene Laughter of neurology. She advises she has already opened the windows in her house in an attempt to rid it of the fumes to which she was exposed.  4:43 AM Nursing staff reports patient threw her discharge papers away stating "that's just a bunch of B.S."   Shanon Rosser, MD 09/17/14 [place], MD 09/17/14 [place], MD 09/17/14 [phone]

## 2014-09-16 NOTE — ED Notes (Signed)
Pt reporting sudden onset of "shaking".  Reporting generalized chest pain, nausea, vomiting, weakness.  Denies recent illness.

## 2014-09-17 DIAGNOSIS — J309 Allergic rhinitis, unspecified: Secondary | ICD-10-CM | POA: Diagnosis not present

## 2014-09-17 DIAGNOSIS — J4540 Moderate persistent asthma, uncomplicated: Secondary | ICD-10-CM | POA: Diagnosis not present

## 2014-09-17 DIAGNOSIS — R251 Tremor, unspecified: Secondary | ICD-10-CM | POA: Diagnosis not present

## 2014-09-17 LAB — BASIC METABOLIC PANEL
Anion gap: 10 (ref 5–15)
BUN: 17 mg/dL (ref 6–23)
CALCIUM: 9.1 mg/dL (ref 8.4–10.5)
CO2: 22 mmol/L (ref 19–32)
CREATININE: 0.93 mg/dL (ref 0.50–1.10)
Chloride: 108 mmol/L (ref 96–112)
GFR calc Af Amer: 80 mL/min — ABNORMAL LOW (ref >90)
GFR, EST NON AFRICAN AMERICAN: 69 mL/min — AB (ref >90)
GLUCOSE: 106 mg/dL — AB (ref 70–99)
Potassium: 3.6 mmol/L (ref 3.5–5.1)
Sodium: 140 mmol/L (ref 135–145)

## 2014-09-17 LAB — CBC WITH DIFFERENTIAL/PLATELET
Basophils Absolute: 0.1 10*3/uL (ref 0.0–0.1)
Basophils Relative: 0 % (ref 0–1)
Eosinophils Absolute: 0.2 10*3/uL (ref 0.0–0.7)
Eosinophils Relative: 2 % (ref 0–5)
HCT: 39.4 % (ref 36.0–46.0)
Hemoglobin: 13.2 g/dL (ref 12.0–15.0)
LYMPHS ABS: 5.5 10*3/uL — AB (ref 0.7–4.0)
LYMPHS PCT: 46 % (ref 12–46)
MCH: 29.8 pg (ref 26.0–34.0)
MCHC: 33.5 g/dL (ref 30.0–36.0)
MCV: 88.9 fL (ref 78.0–100.0)
Monocytes Absolute: 0.8 10*3/uL (ref 0.1–1.0)
Monocytes Relative: 7 % (ref 3–12)
NEUTROS PCT: 45 % (ref 43–77)
Neutro Abs: 5.4 10*3/uL (ref 1.7–7.7)
PLATELETS: 280 10*3/uL (ref 150–400)
RBC: 4.43 MIL/uL (ref 3.87–5.11)
RDW: 14.1 % (ref 11.5–15.5)
WBC: 11.9 10*3/uL — ABNORMAL HIGH (ref 4.0–10.5)

## 2014-09-17 LAB — TROPONIN I: Troponin I: <0.03 ng/mL (ref <0.031)

## 2014-09-17 MED ORDER — ONDANSETRON HCL 4 MG/2ML IJ SOLN: 4.0000 mg | Freq: Once | INTRAMUSCULAR | Status: DC

## 2014-09-17 MED ORDER — DIAZEPAM 5 MG/ML IJ SOLN
5.0000 mg | Freq: Once | INTRAMUSCULAR | Status: AC
  Administered 2014-09-17: 5 mg via INTRAVENOUS
  Filled 2014-09-17: qty 2

## 2014-10-01 ENCOUNTER — Ambulatory Visit (INDEPENDENT_AMBULATORY_CARE_PROVIDER_SITE_OTHER): Payer: Medicare Other | Admitting: Cardiovascular Disease

## 2014-10-01 ENCOUNTER — Encounter: Payer: Self-pay | Admitting: Cardiovascular Disease

## 2014-10-01 VITALS — BP 182/110 | HR 78 | Ht 67.0 in | Wt 209.0 lb

## 2014-10-01 DIAGNOSIS — I5032 Chronic diastolic (congestive) heart failure: Secondary | ICD-10-CM

## 2014-10-01 DIAGNOSIS — I701 Atherosclerosis of renal artery: Secondary | ICD-10-CM | POA: Diagnosis not present

## 2014-10-01 DIAGNOSIS — Z87898 Personal history of other specified conditions: Secondary | ICD-10-CM

## 2014-10-01 DIAGNOSIS — Z9289 Personal history of other medical treatment: Secondary | ICD-10-CM

## 2014-10-01 DIAGNOSIS — I209 Angina pectoris, unspecified: Secondary | ICD-10-CM

## 2014-10-01 DIAGNOSIS — I25118 Atherosclerotic heart disease of native coronary artery with other forms of angina pectoris: Secondary | ICD-10-CM | POA: Diagnosis not present

## 2014-10-01 DIAGNOSIS — I10 Essential (primary) hypertension: Secondary | ICD-10-CM | POA: Diagnosis not present

## 2014-10-01 DIAGNOSIS — G4459 Other complicated headache syndrome: Secondary | ICD-10-CM | POA: Diagnosis not present

## 2014-10-01 DIAGNOSIS — R209 Unspecified disturbances of skin sensation: Secondary | ICD-10-CM | POA: Diagnosis not present

## 2014-10-01 DIAGNOSIS — E785 Hyperlipidemia, unspecified: Secondary | ICD-10-CM

## 2014-10-01 DIAGNOSIS — E038 Other specified hypothyroidism: Secondary | ICD-10-CM

## 2014-10-01 DIAGNOSIS — R251 Tremor, unspecified: Secondary | ICD-10-CM | POA: Diagnosis not present

## 2014-10-01 MED ORDER — LISINOPRIL 40 MG PO TABS: 40.0000 mg | ORAL_TABLET | Freq: Every day | ORAL | Status: DC

## 2014-10-01 MED ORDER — ISOSORBIDE DINITRATE 30 MG PO TABS: 30.0000 mg | ORAL_TABLET | Freq: Two times a day (BID) | ORAL | Status: DC

## 2014-10-01 NOTE — Progress Notes (Signed)
Patient ID: Katrina [name], female   DOB: September 09, 1960, 54 y.o.   MRN: [id number]      SUBJECTIVE:  The patient returns for follow-up of malignant hypertension. Renal artery Dopplers were normal on 06/21/14. She was evaluated for symptoms related to paint exposure in early April and ECG demonstrated sinus tachycardia, heart rate 117 bpm.  She continues to complain of transient chest pains lasting seconds. She said she no longer pays much attention to them. Insurance did not cover isosorbide mononitrate. Her blood pressure remains elevated and she tells me that it was 210/122 at an appointment with her neurologist earlier today. She has also been having tremors and is scheduled for an EEG on May 6. She also complains of leg and feet swelling and cramping.   Review of Systems: As per "subjective", otherwise negative.  Allergies  Allergen Reactions  . Penicillins Anaphylaxis  . Pineapple Rash  . Strawberry Rash  . Poultry Meal     Allergic to [place]  . Aspartame And Phenylalanine Palpitations    Current Outpatient Prescriptions  Medication Sig Dispense Refill  . amLODipine (NORVASC) 10 MG tablet Take 1 tablet (10 mg total) by mouth daily. 30 tablet 6  . aspirin-acetaminophen-caffeine (EXCEDRIN MIGRAINE) [phone] MG per tablet Take 2 tablets by mouth every 6 (six) hours as needed for headache.    . carisoprodol (SOMA) 350 MG tablet Take 350 mg by mouth 4 (four) times daily.   3  . cloNIDine (CATAPRES - DOSED IN MG/24 HR) 0.3 mg/24hr patch Place 1 patch (0.3 mg total) onto the skin once a week. Sundays 4 patch 1  . dexlansoprazole (DEXILANT) 60 MG capsule Take 1 capsule (60 mg total) by mouth every evening. 30 capsule 1  . EPINEPHrine (EPI-PEN) 0.3 mg/0.3 mL DEVI Inject 0.3 mg into the muscle once as needed (allergic reaction).     . gabapentin (NEURONTIN) 600 MG tablet Take 1 tablet (600 mg total) by mouth 3 (three) times daily. 90 tablet 1  . isosorbide mononitrate (IMDUR) 60 MG 24 hr  tablet Take 1 tablet (60 mg total) by mouth every evening. 30 tablet 1  . labetalol (NORMODYNE) 300 MG tablet Take 1 tablet (300 mg total) by mouth 2 (two) times daily. 60 tablet 1  . lisinopril (PRINIVIL,ZESTRIL) 20 MG tablet Take 1 tablet (20 mg total) by mouth daily. 30 tablet 0  . rOPINIRole (REQUIP) 3 MG tablet Take 1 tablet (3 mg total) by mouth at bedtime. 30 tablet 0  . simvastatin (ZOCOR) 20 MG tablet Take 1 tablet (20 mg total) by mouth daily. 30 tablet 3  . triamterene-hydrochlorothiazide (DYAZIDE) 37.5-25 MG per capsule Take 1 each (1 capsule total) by mouth every morning. 30 capsule 1  . zolpidem (AMBIEN) 10 MG tablet Take 1 tablet (10 mg total) by mouth at bedtime. 30 tablet 0   No current facility-administered medications for this visit.    Past Medical History  Diagnosis Date  . Type 2 diabetes mellitus   . Essential hypertension   . Hyperlipidemia   . COPD (chronic obstructive pulmonary disease)   . GERD (gastroesophageal reflux disease)   . CAD (coronary artery disease)     Moderate nonobstructive 2012-2013, [place]  . Hypothyroid   . Lung nodules   . Diverticulosis   . Bipolar affective   . Left-sided weakness 03/20/2014    MRI brain negative for stroke.    Past Surgical History  Procedure Laterality Date  . Back surgery    . Abdominal hysterectomy    .  Appendectomy    . Tonsillectomy    . Ankle surgery    . Cholecystectomy    . Nose surgery      History   Social History  . Marital Status: Divorced    Spouse Name: N/A  . Number of Children: N/A  . Years of Education: N/A   Occupational History  .      Disability for back pain   Social History Main Topics  . Smoking status: Current Every Day Smoker -- 1.00 packs/day for 40 years    Types: Cigarettes  . Smokeless tobacco: Never Used  . Alcohol Use: No  . Drug Use: No  . Sexual Activity: Not on file   Other Topics Concern  . Not on file   Social History Narrative     Filed Vitals:    10/01/14 1524  BP: 182/110  Pulse: 78  Height: 5\' 7"  (1.702 m)  Weight: 209 lb (94.802 kg)  SpO2: 98%    PHYSICAL EXAM General: NAD HEENT: Normal. Neck: No JVD, no thyromegaly. Lungs: Clear to auscultation bilaterally with normal respiratory effort. CV: Nondisplaced PMI.  Regular rate and rhythm, normal S1/S2, no S3/S4, no murmur. +chest wall tenderness. No pretibial or periankle edema.  No carotid bruit.  Normal pedal pulses.  Abdomen: Soft, obese, no distention.  Neurologic: Alert and oriented x 3.  Psych: Normal affect. Skin: Normal. Musculoskeletal: Normal range of motion, no gross deformities. Extremities: No clubbing or cyanosis.   ECG: Most recent ECG reviewed.      ASSESSMENT AND PLAN: 1. Malignant HTN: Remains elevated. Already on clonidine patch, labetalol, lisinopril, and Dyazide. Renal artery Dopplers in 06/2014 showed no evidence of stenosis. Will increase lisinopril to 40 mg daily and start isosorbide dinitrate 30 mg bid (will not take tid).  2. 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 I will start isosorbide dinitrate 30 mg bid (will not take tid) and continue labetalol, ASA 81 mg, along with statin therapy and amlodipine. I will increase lisinopril to 40 mg. I would not rule out the possibility of coronary angiography.  3. Hypothyroidism: Controlled with normal hormone levels in October 2015. Continue levothyroxine 50 g daily.  4. Hyperlipidemia: Continue therapy with simvastatin 20 mg daily given history of coronary artery disease and history of myocardial infarction.   5. Chronic diastolic heart failure: Euvolemic at present. Takes Lasix prn. Goal is to control BP.  Dispo: f/u 2 months.  Kate Sable, M.D., F.A.C.C.

## 2014-10-01 NOTE — Patient Instructions (Addendum)
   Stop Imdur (Isosorbide)  Begin Isosorbide DN 30mg  twice a day    Increase Lisinopril to 40mg  daily - may take 2 of your 20mg  tablets daily till finish current supply.  New prescriptions sent to [facility] today.   Continue all other medications.   Follow up in  2 months

## 2014-10-09 ENCOUNTER — Encounter (HOSPITAL_COMMUNITY): Payer: Self-pay

## 2014-10-09 ENCOUNTER — Emergency Department (HOSPITAL_COMMUNITY)
Admission: EM | Admit: 2014-10-09 | Discharge: 2014-10-10 | Disposition: A | Discharge status: Home / Self Care | Payer: Medicare Other | Attending: Emergency Medicine | Admitting: Emergency Medicine

## 2014-10-09 ENCOUNTER — Telehealth: Payer: Self-pay | Admitting: Internal Medicine

## 2014-10-09 DIAGNOSIS — J449 Chronic obstructive pulmonary disease, unspecified: Secondary | ICD-10-CM | POA: Insufficient documentation

## 2014-10-09 DIAGNOSIS — K219 Gastro-esophageal reflux disease without esophagitis: Secondary | ICD-10-CM | POA: Diagnosis not present

## 2014-10-09 DIAGNOSIS — F319 Bipolar disorder, unspecified: Secondary | ICD-10-CM | POA: Insufficient documentation

## 2014-10-09 DIAGNOSIS — Z8739 Personal history of other diseases of the musculoskeletal system and connective tissue: Secondary | ICD-10-CM | POA: Diagnosis not present

## 2014-10-09 DIAGNOSIS — R079 Chest pain, unspecified: Secondary | ICD-10-CM | POA: Diagnosis not present

## 2014-10-09 DIAGNOSIS — F419 Anxiety disorder, unspecified: Secondary | ICD-10-CM | POA: Insufficient documentation

## 2014-10-09 DIAGNOSIS — E785 Hyperlipidemia, unspecified: Secondary | ICD-10-CM | POA: Diagnosis not present

## 2014-10-09 DIAGNOSIS — Z720 Tobacco use: Secondary | ICD-10-CM | POA: Insufficient documentation

## 2014-10-09 DIAGNOSIS — R51 Headache: Secondary | ICD-10-CM | POA: Diagnosis not present

## 2014-10-09 DIAGNOSIS — R61 Generalized hyperhidrosis: Secondary | ICD-10-CM | POA: Insufficient documentation

## 2014-10-09 DIAGNOSIS — Z79899 Other long term (current) drug therapy: Secondary | ICD-10-CM | POA: Diagnosis not present

## 2014-10-09 DIAGNOSIS — I10 Essential (primary) hypertension: Secondary | ICD-10-CM | POA: Diagnosis not present

## 2014-10-09 DIAGNOSIS — E119 Type 2 diabetes mellitus without complications: Secondary | ICD-10-CM | POA: Diagnosis not present

## 2014-10-09 DIAGNOSIS — Z880 Allergy status to penicillin: Secondary | ICD-10-CM | POA: Diagnosis not present

## 2014-10-09 DIAGNOSIS — R0789 Other chest pain: Secondary | ICD-10-CM | POA: Diagnosis not present

## 2014-10-09 DIAGNOSIS — I2510 Atherosclerotic heart disease of native coronary artery without angina pectoris: Secondary | ICD-10-CM | POA: Diagnosis not present

## 2014-10-09 MED ORDER — MORPHINE SULFATE 4 MG/ML IJ SOLN
4.0000 mg | Freq: Once | INTRAMUSCULAR | Status: AC
  Administered 2014-10-10: 4 mg via INTRAVENOUS
  Filled 2014-10-09: qty 1

## 2014-10-09 MED ORDER — PROCHLORPERAZINE EDISYLATE 5 MG/ML IJ SOLN
5.0000 mg | Freq: Once | INTRAMUSCULAR | Status: AC
  Administered 2014-10-10: 5 mg via INTRAVENOUS
  Filled 2014-10-09: qty 2

## 2014-10-09 NOTE — ED Notes (Signed)
Pt reports left chest pressure for the past couple of hours.  Pt states she called her cardiologist and was told to go to [facility] but she felt she didn't want to go that far.

## 2014-10-09 NOTE — Telephone Encounter (Signed)
Called by Ms. Kondracki.  She has had several hours of moderate severe chest pain in the setting of higher than normal (though chornically elevated malignant hypertension).  She states her BP 230/120, typically 190/100s.  She does not have NTG at home.  This pain is similar to her known angina, but more intense than normal and in slightly different location.  I recommended that she come to the [facility] ER for further evaluation of chest pain.  Terressa Koyanagi MD Cardiology Fellow

## 2014-10-09 NOTE — ED Provider Notes (Signed)
CSN: [id number]     Arrival date & time 10/09/14  2310 History   First MD Initiated Contact with Patient 10/09/14 2335     Chief Complaint  Patient presents with  . Chest Pain     (Consider location/radiation/quality/duration/timing/severity/associated sxs/prior Treatment) Patient is a 54 y.o. female presenting with chest pain. The history is provided by the patient.  Chest Pain Pain location:  L chest Pain quality: pressure and sharp   Pain radiates to:  Mid back Pain severity:  Moderate Duration:  2 hours Timing:  Intermittent Progression:  Worsening Chronicity:  Recurrent Context: not eating and no movement   Relieved by:  Nothing Worsened by:  Nothing tried Ineffective treatments:  None tried Associated symptoms: diaphoresis and headache   Associated symptoms: no back pain, no palpitations, no syncope and not vomiting   Risk factors: diabetes mellitus, hypertension and smoking   Risk factors: no prior DVT/PE and no surgery     Past Medical History  Diagnosis Date  . Type 2 diabetes mellitus   . Essential hypertension   . Hyperlipidemia   . COPD (chronic obstructive pulmonary disease)   . GERD (gastroesophageal reflux disease)   . CAD (coronary artery disease)     Moderate nonobstructive 2012-2013, [place]  . Hypothyroid   . Lung nodules   . Diverticulosis   . Bipolar affective   . Left-sided weakness 03/20/2014    MRI brain negative for stroke.   Past Surgical History  Procedure Laterality Date  . Back surgery    . Abdominal hysterectomy    . Appendectomy    . Tonsillectomy    . Ankle surgery    . Cholecystectomy    . Nose surgery     Family History  Problem Relation Age of Onset  . Coronary artery disease Father 44  . Emphysema Father    History  Substance Use Topics  . Smoking status: Current Every Day Smoker -- 1.00 packs/day for 40 years    Types: Cigarettes  . Smokeless tobacco: Never Used  . Alcohol Use: No   OB History    No data  available     Review of Systems  Constitutional: Positive for diaphoresis.  Cardiovascular: Positive for chest pain. Negative for palpitations and syncope.  Gastrointestinal: Negative for vomiting.  Musculoskeletal: Negative for back pain.  Neurological: Positive for headaches.  Psychiatric/Behavioral:       Bipolar affect  All other systems reviewed and are negative.     Allergies  Penicillins; Pineapple; Strawberry; Poultry meal; and Aspartame and phenylalanine  Home Medications   Prior to Admission medications   Medication Sig Start Date End Date Taking? Authorizing Provider  amLODipine (NORVASC) 10 MG tablet Take 1 tablet (10 mg total) by mouth daily. 06/05/14   Herminio Commons, MD  aspirin-acetaminophen-caffeine (EXCEDRIN MIGRAINE) [phone] MG per tablet Take 2 tablets by mouth every 6 (six) hours as needed for headache.    Historical Provider, MD  carisoprodol (SOMA) 350 MG tablet Take 350 mg by mouth 4 (four) times daily.  05/27/14   Historical Provider, MD  cloNIDine (CATAPRES - DOSED IN MG/24 HR) 0.3 mg/24hr patch Place 1 patch (0.3 mg total) onto the skin once a week. Sundays 03/20/14   Rexene Alberts, MD  dexlansoprazole (DEXILANT) 60 MG capsule Take 1 capsule (60 mg total) by mouth every evening. 03/20/14   Rexene Alberts, MD  EPINEPHrine (EPI-PEN) 0.3 mg/0.3 mL DEVI Inject 0.3 mg into the muscle once as needed (allergic reaction).  Historical Provider, MD  gabapentin (NEURONTIN) 600 MG tablet Take 1 tablet (600 mg total) by mouth 3 (three) times daily. 03/20/14   Rexene Alberts, MD  isosorbide dinitrate (ISORDIL) 30 MG tablet Take 1 tablet (30 mg total) by mouth 2 (two) times daily. 10/01/14   Herminio Commons, MD  labetalol (NORMODYNE) 300 MG tablet Take 1 tablet (300 mg total) by mouth 2 (two) times daily. 03/20/14   Rexene Alberts, MD  lisinopril (PRINIVIL,ZESTRIL) 40 MG tablet Take 1 tablet (40 mg total) by mouth daily. 10/01/14   Herminio Commons, MD   rOPINIRole (REQUIP) 3 MG tablet Take 1 tablet (3 mg total) by mouth at bedtime. 03/20/14   Rexene Alberts, MD  simvastatin (ZOCOR) 20 MG tablet Take 1 tablet (20 mg total) by mouth daily. 04/10/14   Herminio Commons, MD  triamterene-hydrochlorothiazide (DYAZIDE) 37.5-25 MG per capsule Take 1 each (1 capsule total) by mouth every morning. 03/20/14   Rexene Alberts, MD  zolpidem (AMBIEN) 10 MG tablet Take 1 tablet (10 mg total) by mouth at bedtime. 03/20/14   Rexene Alberts, MD   There were no vitals taken for this visit. Physical Exam  Constitutional: She is oriented to person, place, and time. She appears well-developed and well-nourished.  Non-toxic appearance.  HENT:  Head: Normocephalic.  Right Ear: Tympanic membrane and external ear normal.  Left Ear: Tympanic membrane and external ear normal.  Eyes: EOM and lids are normal. Pupils are equal, round, and reactive to light.  Neck: Normal range of motion. Neck supple. Carotid bruit is not present.  Cardiovascular: Normal rate, regular rhythm, normal heart sounds, intact distal pulses and normal pulses.   Pulmonary/Chest: Breath sounds normal. No respiratory distress. She has no wheezes. She has no rales. She exhibits no tenderness.  Abdominal: Soft. Bowel sounds are normal. There is no tenderness. There is no guarding.  Musculoskeletal: Normal range of motion. She exhibits no edema or tenderness.  Lymphadenopathy:       Head (right side): No submandibular adenopathy present.       Head (left side): No submandibular adenopathy present.    She has no cervical adenopathy.  Neurological: She is alert and oriented to person, place, and time. She has normal strength. No cranial nerve deficit or sensory deficit.  Skin: Skin is warm and dry.  Psychiatric: She has a normal mood and affect. Her speech is normal.  anxious  Nursing note and vitals reviewed.   ED Course  Procedures (including critical care time) Labs Review Labs Reviewed - No  data to display  Imaging Review No results found.   EKG Interpretation None      MDM  Lipase wnl. Cmet non-acute, Troponin and EKG neg for acute event. CBC neg. Chest  Xray - no active disease. Pt was seen by cardiology last week and told they would monitor her blood pressure and chest pain. She was told she may need another cardiac cath. I have discussed findings with the patient. Feel it is safe for pt to be discharged home with close outpatient follow up. Pt to return to ED immediately if any changes or problem.   Final diagnoses:  None    *I have reviewed nursing notes, vital signs, and all appropriate lab and imaging results for this patient.[address] PA-C 10/10/14 4665  Veryl Speak, MD 10/10/14 [phone]

## 2014-10-10 ENCOUNTER — Emergency Department (HOSPITAL_COMMUNITY): Payer: Medicare Other

## 2014-10-10 DIAGNOSIS — R079 Chest pain, unspecified: Secondary | ICD-10-CM | POA: Diagnosis not present

## 2014-10-10 LAB — TROPONIN I

## 2014-10-10 LAB — CBC WITH DIFFERENTIAL/PLATELET
BASOS PCT: 1 % (ref 0–1)
Basophils Absolute: 0.1 10*3/uL (ref 0.0–0.1)
Eosinophils Absolute: 0.3 10*3/uL (ref 0.0–0.7)
Eosinophils Relative: 4 % (ref 0–5)
HCT: 39.4 % (ref 36.0–46.0)
HEMOGLOBIN: 12.9 g/dL (ref 12.0–15.0)
LYMPHS PCT: 49 % — AB (ref 12–46)
Lymphs Abs: 4.1 10*3/uL — ABNORMAL HIGH (ref 0.7–4.0)
MCH: 29.5 pg (ref 26.0–34.0)
MCHC: 32.7 g/dL (ref 30.0–36.0)
MCV: 90 fL (ref 78.0–100.0)
MONO ABS: 0.7 10*3/uL (ref 0.1–1.0)
MONOS PCT: 8 % (ref 3–12)
NEUTROS ABS: 3.4 10*3/uL (ref 1.7–7.7)
NEUTROS PCT: 40 % — AB (ref 43–77)
Platelets: 257 10*3/uL (ref 150–400)
RBC: 4.38 MIL/uL (ref 3.87–5.11)
RDW: 14.2 % (ref 11.5–15.5)
WBC: 8.5 10*3/uL (ref 4.0–10.5)

## 2014-10-10 LAB — COMPREHENSIVE METABOLIC PANEL
ALT: 15 U/L (ref 14–54)
AST: 15 U/L (ref 15–41)
Albumin: 3.7 g/dL (ref 3.5–5.0)
Alkaline Phosphatase: 72 U/L (ref 38–126)
Anion gap: 7 (ref 5–15)
BUN: 23 mg/dL — AB (ref 6–20)
CALCIUM: 8.7 mg/dL — AB (ref 8.9–10.3)
CHLORIDE: 108 mmol/L (ref 101–111)
CO2: 24 mmol/L (ref 22–32)
CREATININE: 0.86 mg/dL (ref 0.44–1.00)
GFR calc Af Amer: >60 mL/min (ref >60)
GLUCOSE: 107 mg/dL — AB (ref 70–99)
POTASSIUM: 3.6 mmol/L (ref 3.5–5.1)
Sodium: 139 mmol/L (ref 135–145)
Total Bilirubin: 0.1 mg/dL — ABNORMAL LOW (ref 0.3–1.2)
Total Protein: 7 g/dL (ref 6.5–8.1)

## 2014-10-10 LAB — LIPASE, BLOOD: LIPASE: 25 U/L (ref 22–51)

## 2014-10-10 NOTE — Discharge Instructions (Signed)
Your EKG, troponin, and chest xray are negative for acute problem. Please see your cardiologist as soon as possible for evaluation an management. Please return to the ED sooner if any changes or problem. Chest Pain (Nonspecific) It is often hard to give a diagnosis for the cause of chest pain. There is always a chance that your pain could be related to something serious, such as a heart attack or a blood clot in the lungs. You need to follow up with your doctor. HOME CARE  If antibiotic medicine was given, take it as directed by your doctor. Finish the medicine even if you start to feel better.  For the next few days, avoid activities that bring on chest pain. Continue physical activities as told by your doctor.  Do not use any tobacco products. This includes cigarettes, chewing tobacco, and e-cigarettes.  Avoid drinking alcohol.  Only take medicine as told by your doctor.  Follow your doctor's suggestions for more testing if your chest pain does not go away.  Keep all doctor visits you made. GET HELP IF:  Your chest pain does not go away, even after treatment.  You have a rash with blisters on your chest.  You have a fever. GET HELP RIGHT AWAY IF:   You have more pain or pain that spreads to your arm, neck, jaw, back, or belly (abdomen).  You have shortness of breath.  You cough more than usual or cough up blood.  You have very bad back or belly pain.  You feel sick to your stomach (nauseous) or throw up (vomit).  You have very bad weakness.  You pass out (faint).  You have chills. This is an emergency. Do not wait to see if the problems will go away. Call your local emergency services (911 in [place] Do not drive yourself to the hospital. MAKE SURE YOU:   Understand these instructions.  Will watch your condition.  Will get help right away if you are not doing well or get worse. Document Released: 11/10/2007 Document Revised: 05/29/2013 Document Reviewed:  11/10/2007 [facility] Patient Information 2015 [place], [place]. This information is not intended to replace advice given to you by your health care provider. Make sure you discuss any questions you have with your health care provider.

## 2014-10-10 NOTE — ED Provider Notes (Signed)
EKG:  Rhythm: normal sinus Vent. rate 81 BPM PR interval 180 ms QRS duration 97 ms QT/QTc 400/464 ms LAE ST segments: normal   Virgel Manifold, MD 10/10/14 0028

## 2014-10-14 DIAGNOSIS — F314 Bipolar disorder, current episode depressed, severe, without psychotic features: Secondary | ICD-10-CM | POA: Diagnosis not present

## 2014-11-06 DIAGNOSIS — Z9049 Acquired absence of other specified parts of digestive tract: Secondary | ICD-10-CM | POA: Diagnosis not present

## 2014-11-06 DIAGNOSIS — I2510 Atherosclerotic heart disease of native coronary artery without angina pectoris: Secondary | ICD-10-CM | POA: Diagnosis not present

## 2014-11-06 DIAGNOSIS — F329 Major depressive disorder, single episode, unspecified: Secondary | ICD-10-CM | POA: Diagnosis not present

## 2014-11-06 DIAGNOSIS — E119 Type 2 diabetes mellitus without complications: Secondary | ICD-10-CM | POA: Diagnosis not present

## 2014-11-06 DIAGNOSIS — F17210 Nicotine dependence, cigarettes, uncomplicated: Secondary | ICD-10-CM | POA: Diagnosis not present

## 2014-11-06 DIAGNOSIS — J449 Chronic obstructive pulmonary disease, unspecified: Secondary | ICD-10-CM | POA: Diagnosis not present

## 2014-11-06 DIAGNOSIS — J45909 Unspecified asthma, uncomplicated: Secondary | ICD-10-CM | POA: Diagnosis not present

## 2014-11-06 DIAGNOSIS — K219 Gastro-esophageal reflux disease without esophagitis: Secondary | ICD-10-CM | POA: Diagnosis not present

## 2014-11-06 DIAGNOSIS — Z90710 Acquired absence of both cervix and uterus: Secondary | ICD-10-CM | POA: Diagnosis not present

## 2014-11-06 DIAGNOSIS — Z7982 Long term (current) use of aspirin: Secondary | ICD-10-CM | POA: Diagnosis not present

## 2014-11-06 DIAGNOSIS — R079 Chest pain, unspecified: Secondary | ICD-10-CM | POA: Diagnosis not present

## 2014-11-06 DIAGNOSIS — I10 Essential (primary) hypertension: Secondary | ICD-10-CM | POA: Diagnosis not present

## 2014-11-06 DIAGNOSIS — L03312 Cellulitis of back [any part except buttock]: Secondary | ICD-10-CM | POA: Diagnosis not present

## 2014-11-13 ENCOUNTER — Other Ambulatory Visit (HOSPITAL_COMMUNITY): Payer: Self-pay | Admitting: Respiratory Therapy

## 2014-11-13 DIAGNOSIS — R251 Tremor, unspecified: Secondary | ICD-10-CM

## 2014-11-20 ENCOUNTER — Emergency Department (HOSPITAL_COMMUNITY)
Admission: EM | Admit: 2014-11-20 | Discharge: 2014-11-20 | Disposition: A | Discharge status: Home / Self Care | Payer: Medicare Other | Attending: Emergency Medicine | Admitting: Emergency Medicine

## 2014-11-20 ENCOUNTER — Encounter (HOSPITAL_COMMUNITY): Payer: Self-pay

## 2014-11-20 DIAGNOSIS — I10 Essential (primary) hypertension: Secondary | ICD-10-CM | POA: Insufficient documentation

## 2014-11-20 DIAGNOSIS — E119 Type 2 diabetes mellitus without complications: Secondary | ICD-10-CM | POA: Diagnosis not present

## 2014-11-20 DIAGNOSIS — E785 Hyperlipidemia, unspecified: Secondary | ICD-10-CM | POA: Diagnosis not present

## 2014-11-20 DIAGNOSIS — Z8739 Personal history of other diseases of the musculoskeletal system and connective tissue: Secondary | ICD-10-CM | POA: Diagnosis not present

## 2014-11-20 DIAGNOSIS — L02212 Cutaneous abscess of back [any part, except buttock]: Secondary | ICD-10-CM | POA: Insufficient documentation

## 2014-11-20 DIAGNOSIS — Z79899 Other long term (current) drug therapy: Secondary | ICD-10-CM | POA: Diagnosis not present

## 2014-11-20 DIAGNOSIS — J449 Chronic obstructive pulmonary disease, unspecified: Secondary | ICD-10-CM | POA: Diagnosis not present

## 2014-11-20 DIAGNOSIS — K219 Gastro-esophageal reflux disease without esophagitis: Secondary | ICD-10-CM | POA: Diagnosis not present

## 2014-11-20 DIAGNOSIS — F319 Bipolar disorder, unspecified: Secondary | ICD-10-CM | POA: Insufficient documentation

## 2014-11-20 DIAGNOSIS — I2510 Atherosclerotic heart disease of native coronary artery without angina pectoris: Secondary | ICD-10-CM | POA: Diagnosis not present

## 2014-11-20 DIAGNOSIS — Z880 Allergy status to penicillin: Secondary | ICD-10-CM | POA: Insufficient documentation

## 2014-11-20 DIAGNOSIS — Z720 Tobacco use: Secondary | ICD-10-CM | POA: Diagnosis not present

## 2014-11-20 DIAGNOSIS — L0291 Cutaneous abscess, unspecified: Secondary | ICD-10-CM

## 2014-11-20 MED ORDER — OXYCODONE-ACETAMINOPHEN 5-325 MG PO TABS
2.0000 | ORAL_TABLET | Freq: Once | ORAL | Status: AC
Start: 2014-11-20 — End: 2014-11-20
  Administered 2014-11-20: 2 via ORAL
  Filled 2014-11-20: qty 2

## 2014-11-20 MED ORDER — LIDOCAINE-EPINEPHRINE 1 %-1:100000 IJ SOLN
10.0000 mL | Freq: Once | INTRAMUSCULAR | Status: DC
  Filled 2014-11-20: qty 10

## 2014-11-20 MED ORDER — LIDOCAINE-EPINEPHRINE (PF) 1 %-1:200000 IJ SOLN
INTRAMUSCULAR | Status: AC
  Filled 2014-11-20: qty 10

## 2014-11-20 NOTE — ED Provider Notes (Signed)
CSN: [id number]     Arrival date & time 11/20/14  1918 History  This chart was scribed for Virgel Manifold, MD by Julien Nordmann, ED Scribe. This patient was seen in room APA03/APA03 and the patient's care was started at 8:12 PM.     Chief Complaint  Patient presents with  . Abscess      The history is provided by the patient. No language interpreter was used.    HPI Comments: Katrina [name] is a 54 y.o. female who presents to the Emergency Department complaining of painful lesion to R lateral back. Recently seen out of town and diagnosed with abscess. Placed on abx. Apparently declined I&D because she didn't want to deal with wound care while traveling. Area has decreased in size but remains painful. No fever or chills. No drainage.   Past Medical History  Diagnosis Date  . Type 2 diabetes mellitus   . Essential hypertension   . Hyperlipidemia   . COPD (chronic obstructive pulmonary disease)   . GERD (gastroesophageal reflux disease)   . CAD (coronary artery disease)     Moderate nonobstructive 2012-2013, [place]  . Hypothyroid   . Lung nodules   . Diverticulosis   . Bipolar affective   . Left-sided weakness 03/20/2014    MRI brain negative for stroke.   Past Surgical History  Procedure Laterality Date  . Back surgery    . Abdominal hysterectomy    . Appendectomy    . Tonsillectomy    . Ankle surgery    . Cholecystectomy    . Nose surgery     Family History  Problem Relation Age of Onset  . Coronary artery disease Father 73  . Emphysema Father    History  Substance Use Topics  . Smoking status: Current Every Day Smoker -- 1.00 packs/day for 40 years    Types: Cigarettes  . Smokeless tobacco: Never Used  . Alcohol Use: No   OB History    No data available     Review of Systems  All other systems reviewed and are negative.     Allergies  Penicillins; Pineapple; Strawberry; Poultry meal; and Aspartame and phenylalanine  Home Medications   Prior to  Admission medications   Medication Sig Start Date End Date Taking? Authorizing Provider  amLODipine (NORVASC) 10 MG tablet Take 1 tablet (10 mg total) by mouth daily. 06/05/14   Herminio Commons, MD  aspirin-acetaminophen-caffeine (EXCEDRIN MIGRAINE) [phone] MG per tablet Take 2 tablets by mouth every 6 (six) hours as needed for headache.    Historical Provider, MD  carisoprodol (SOMA) 350 MG tablet Take 350 mg by mouth 4 (four) times daily.  05/27/14   Historical Provider, MD  cloNIDine (CATAPRES - DOSED IN MG/24 HR) 0.3 mg/24hr patch Place 1 patch (0.3 mg total) onto the skin once a week. Sundays 03/20/14   Rexene Alberts, MD  dexlansoprazole (DEXILANT) 60 MG capsule Take 1 capsule (60 mg total) by mouth every evening. 03/20/14   Rexene Alberts, MD  EPINEPHrine (EPI-PEN) 0.3 mg/0.3 mL DEVI Inject 0.3 mg into the muscle once as needed (allergic reaction).     Historical Provider, MD  gabapentin (NEURONTIN) 600 MG tablet Take 1 tablet (600 mg total) by mouth 3 (three) times daily. 03/20/14   Rexene Alberts, MD  isosorbide dinitrate (ISORDIL) 30 MG tablet Take 1 tablet (30 mg total) by mouth 2 (two) times daily. 10/01/14   Herminio Commons, MD  labetalol (NORMODYNE) 300 MG tablet Take 1 tablet (  300 mg total) by mouth 2 (two) times daily. 03/20/14   Rexene Alberts, MD  lisinopril (PRINIVIL,ZESTRIL) 40 MG tablet Take 1 tablet (40 mg total) by mouth daily. 10/01/14   Herminio Commons, MD  rOPINIRole (REQUIP) 3 MG tablet Take 1 tablet (3 mg total) by mouth at bedtime. 03/20/14   Rexene Alberts, MD  simvastatin (ZOCOR) 20 MG tablet Take 1 tablet (20 mg total) by mouth daily. 04/10/14   Herminio Commons, MD  triamterene-hydrochlorothiazide (DYAZIDE) 37.5-25 MG per capsule Take 1 each (1 capsule total) by mouth every morning. 03/20/14   Rexene Alberts, MD  zolpidem (AMBIEN) 10 MG tablet Take 1 tablet (10 mg total) by mouth at bedtime. 03/20/14   Rexene Alberts, MD   Triage vitals: BP 191/104 mmHg   Pulse 83  Temp(Src) 98.9 F (37.2 C) (Oral)  Resp 20  Ht 5\' 7"  (1.702 m)  Wt 205 lb (92.987 kg)  BMI 32.10 kg/m2  SpO2 100% Physical Exam  Constitutional: She is oriented to person, place, and time. She appears well-developed and well-nourished. No distress.  HENT:  Head: Normocephalic.  Eyes: Conjunctivae are normal. Pupils are equal, round, and reactive to light. No scleral icterus.  Neck: Normal range of motion. Neck supple. No thyromegaly present.  Cardiovascular: Normal rate and regular rhythm.  Exam reveals no gallop and no friction rub.   No murmur heard. Pulmonary/Chest: Effort normal and breath sounds normal. No respiratory distress. She has no wheezes. She has no rales.    Irregular ovoid mass in pictured area. No erythema. No appreciable fluctuance. Just feels indurated. Bedside US confirms abscess though.   Abdominal: Soft. Bowel sounds are normal. She exhibits no distension. There is no tenderness. There is no rebound.  Musculoskeletal: Normal range of motion.  Neurological: She is alert and oriented to person, place, and time.  Skin: Skin is warm and dry. No rash noted.  Psychiatric: She has a normal mood and affect. Her behavior is normal.    ED Course  Procedures   INCISION AND DRAINAGE Performed by: Virgel Manifold Consent: Verbal consent obtained. Risks and benefits: risks, benefits and alternatives were discussed Type: abscess  Body area: R back  Anesthesia: local infiltration  Incision was made with a scalpel.  Local anesthetic: lidocaine 1% w epinephrine  Anesthetic total: 3 ml  Complexity: complex Blunt dissection to break up loculations  Drainage: purulent  Drainage amount: moderate  Packing material: none  Patient tolerance: Patient tolerated the procedure well with no immediate complications.  ULTRASOUND LIMITED SOFT TISSUE/ MUSCULOSKELETAL: R mid back Indication: painful back lesion, evaluate for drainable collection Linear probe  used to evaluate area of interest in two planes. Findings:  subcutaneous collection consistent with abscess Performed by: Dr Wilson Singer Images saved electronically   DIAGNOSTIC STUDIES: Oxygen Saturation is 100% on RA, normal by my interpretation.  COORDINATION OF CARE: 8:12 PM  Labs Review Labs Reviewed - No data to display  Imaging Review No results found.   EKG Interpretation None      MDM   Final diagnoses:  Abscess    53yF with abscess to R back. I&D. Continue previously prescribed abx. Wound acre and return precautions discussed.   I personally preformed the services scribed in my presence. The recorded information has been reviewed is accurate. Virgel Manifold, MD.   Virgel Manifold, MD 11/28/14 [phone]

## 2014-11-20 NOTE — ED Notes (Signed)
Patient c/o abscess to right upper back. Patient has been treated with IV and PO antibiotics and states the abscess has decreased in size but is not completely gone. Denies drainage from abscess

## 2014-11-20 NOTE — Discharge Instructions (Signed)
Abscess °An abscess is an infected area that contains a collection of pus and debris. It can occur in almost any part of the body. An abscess is also known as a furuncle or boil. °CAUSES  °An abscess occurs when tissue gets infected. This can occur from blockage of oil or sweat glands, infection of hair follicles, or a minor injury to the skin. As the body tries to fight the infection, pus collects in the area and creates pressure under the skin. This pressure causes pain. People with weakened immune systems have difficulty fighting infections and get certain abscesses more often.  °SYMPTOMS °Usually an abscess develops on the skin and becomes a painful mass that is red, warm, and tender. If the abscess forms under the skin, you may feel a moveable soft area under the skin. Some abscesses break open (rupture) on their own, but most will continue to get worse without care. The infection can spread deeper into the body and eventually into the bloodstream, causing you to feel ill.  °DIAGNOSIS  °Your caregiver will take your medical history and perform a physical exam. A sample of fluid may also be taken from the abscess to determine what is causing your infection. °TREATMENT  °Your caregiver may prescribe antibiotic medicines to fight the infection. However, taking antibiotics alone usually does not cure an abscess. Your caregiver may need to make a small cut (incision) in the abscess to drain the pus. In some cases, gauze is packed into the abscess to reduce pain and to continue draining the area. °HOME CARE INSTRUCTIONS  °· Only take over-the-counter or prescription medicines for pain, discomfort, or fever as directed by your caregiver. °· If you were prescribed antibiotics, take them as directed. Finish them even if you start to feel better. °· If gauze is used, follow your caregiver's directions for changing the gauze. °· To avoid spreading the infection: °· Keep your draining abscess covered with a  bandage. °· Wash your hands well. °· Do not share personal care items, towels, or whirlpools with others. °· Avoid skin contact with others. °· Keep your skin and clothes clean around the abscess. °· Keep all follow-up appointments as directed by your caregiver. °SEEK MEDICAL CARE IF:  °· You have increased pain, swelling, redness, fluid drainage, or bleeding. °· You have muscle aches, chills, or a general ill feeling. °· You have a fever. °MAKE SURE YOU:  °· Understand these instructions. °· Will watch your condition. °· Will get help right away if you are not doing well or get worse. °Document Released: 03/03/2005 Document Revised: 11/23/2011 Document Reviewed: 08/06/2011 °[employer]® Patient Information ©2015 [employer]. This information is not intended to replace advice given to you by your health care provider. Make sure you discuss any questions you have with your health care provider. ° °Abscess °Care After °An abscess (also called a boil or furuncle) is an infected area that contains a collection of pus. Signs and symptoms of an abscess include pain, tenderness, redness, or hardness, or you may feel a moveable soft area under your skin. An abscess can occur anywhere in the body. The infection may spread to surrounding tissues causing cellulitis. A cut (incision) by the surgeon was made over your abscess and the pus was drained out. Gauze may have been packed into the space to provide a drain that will allow the cavity to heal from the inside outwards. The boil may be painful for 5 to 7 days. Most people with a boil do not have   high fevers. Your abscess, if seen early, may not have localized, and may not have been lanced. If not, another appointment may be required for this if it does not get better on its own or with medications. °HOME CARE INSTRUCTIONS  °· Only take over-the-counter or prescription medicines for pain, discomfort, or fever as directed by your caregiver. °· When you bathe, soak and then  remove gauze or iodoform packs at least daily or as directed by your caregiver. You may then wash the wound gently with mild soapy water. Repack with gauze or do as your caregiver directs. °SEEK IMMEDIATE MEDICAL CARE IF:  °· You develop increased pain, swelling, redness, drainage, or bleeding in the wound site. °· You develop signs of generalized infection including muscle aches, chills, fever, or a general ill feeling. °· An oral temperature above 102° F (38.9° C) develops, not controlled by medication. °See your caregiver for a recheck if you develop any of the symptoms described above. If medications (antibiotics) were prescribed, take them as directed. °Document Released: 12/10/2004 Document Revised: 08/16/2011 Document Reviewed: 08/07/2007 °Exit[employer]® Patient Information ©2015 [employer]. This information is not intended to replace advice given to you by your health care provider. Make sure you discuss any questions you have with your health care provider. ° °

## 2014-11-29 DIAGNOSIS — F314 Bipolar disorder, current episode depressed, severe, without psychotic features: Secondary | ICD-10-CM | POA: Diagnosis not present

## 2014-12-04 ENCOUNTER — Ambulatory Visit (HOSPITAL_COMMUNITY)
Admission: RE | Admit: 2014-12-04 | Discharge: 2014-12-04 | Disposition: A | Discharge status: Home / Self Care | Payer: Medicare Other | Source: Ambulatory Visit | Attending: Neurology | Admitting: Neurology

## 2014-12-04 ENCOUNTER — Ambulatory Visit (HOSPITAL_COMMUNITY): Payer: Medicare Other

## 2014-12-04 DIAGNOSIS — R410 Disorientation, unspecified: Secondary | ICD-10-CM | POA: Diagnosis not present

## 2014-12-04 DIAGNOSIS — Z79899 Other long term (current) drug therapy: Secondary | ICD-10-CM | POA: Insufficient documentation

## 2014-12-04 DIAGNOSIS — R251 Tremor, unspecified: Secondary | ICD-10-CM | POA: Diagnosis not present

## 2014-12-04 NOTE — Progress Notes (Signed)
EEG Completed; Results Pending  

## 2014-12-06 ENCOUNTER — Ambulatory Visit (INDEPENDENT_AMBULATORY_CARE_PROVIDER_SITE_OTHER): Payer: Medicare Other | Admitting: Cardiovascular Disease

## 2014-12-06 ENCOUNTER — Encounter: Payer: Self-pay | Admitting: Cardiovascular Disease

## 2014-12-06 VITALS — BP 188/102 | HR 78 | Ht 67.0 in | Wt 206.0 lb

## 2014-12-06 DIAGNOSIS — I5032 Chronic diastolic (congestive) heart failure: Secondary | ICD-10-CM

## 2014-12-06 DIAGNOSIS — E785 Hyperlipidemia, unspecified: Secondary | ICD-10-CM

## 2014-12-06 DIAGNOSIS — I10 Essential (primary) hypertension: Secondary | ICD-10-CM | POA: Diagnosis not present

## 2014-12-06 DIAGNOSIS — Z9289 Personal history of other medical treatment: Secondary | ICD-10-CM

## 2014-12-06 DIAGNOSIS — I209 Angina pectoris, unspecified: Secondary | ICD-10-CM

## 2014-12-06 DIAGNOSIS — Z87898 Personal history of other specified conditions: Secondary | ICD-10-CM

## 2014-12-06 DIAGNOSIS — I25118 Atherosclerotic heart disease of native coronary artery with other forms of angina pectoris: Secondary | ICD-10-CM

## 2014-12-06 DIAGNOSIS — I701 Atherosclerosis of renal artery: Secondary | ICD-10-CM | POA: Diagnosis not present

## 2014-12-06 MED ORDER — HYDRALAZINE HCL 25 MG PO TABS: 25.0000 mg | ORAL_TABLET | Freq: Three times a day (TID) | ORAL | Status: DC

## 2014-12-06 MED ORDER — CLONIDINE HCL 0.3 MG/24HR TD PTWK: 0.3000 mg | MEDICATED_PATCH | TRANSDERMAL | Status: DC

## 2014-12-06 NOTE — Patient Instructions (Signed)
   Clonidine patch refilled today.  Begin Hydralazine 25mg  three times per day - new sent to pharmacy today. Continue all other medications.   Follow up in  6-8 weeks

## 2014-12-06 NOTE — Progress Notes (Signed)
Patient ID: Katrina [name], female   DOB: [birth year], 54 y.o.   MRN: [id number]      SUBJECTIVE: The patient returns for follow-up of malignant hypertension. Renal artery Dopplers were normal on 06/21/14. Still having chest pains, left-sided, non-exertional. Also has leg and feet cramping. Evaluated in ED for chest pain on 5/4.  Review of Systems: As per "subjective", otherwise negative.  Allergies  Allergen Reactions  . Penicillins Anaphylaxis  . Pineapple Rash  . Strawberry Rash  . Poultry Meal     Allergic to [place]  . Aspartame And Phenylalanine Palpitations    Current Outpatient Prescriptions  Medication Sig Dispense Refill  . amLODipine (NORVASC) 10 MG tablet Take 1 tablet (10 mg total) by mouth daily. 30 tablet 6  . aspirin-acetaminophen-caffeine (EXCEDRIN MIGRAINE) [phone] MG per tablet Take 2 tablets by mouth every 6 (six) hours as needed for headache.    . carisoprodol (SOMA) 350 MG tablet Take 350 mg by mouth 4 (four) times daily.   3  . cloNIDine (CATAPRES - DOSED IN MG/24 HR) 0.3 mg/24hr patch Place 1 patch (0.3 mg total) onto the skin once a week. Sundays 4 patch 1  . dexlansoprazole (DEXILANT) 60 MG capsule Take 1 capsule (60 mg total) by mouth every evening. 30 capsule 1  . EPINEPHrine (EPI-PEN) 0.3 mg/0.3 mL DEVI Inject 0.3 mg into the muscle once as needed (allergic reaction).     . gabapentin (NEURONTIN) 600 MG tablet Take 1 tablet (600 mg total) by mouth 3 (three) times daily. 90 tablet 1  . isosorbide dinitrate (ISORDIL) 30 MG tablet Take 1 tablet (30 mg total) by mouth 2 (two) times daily. 60 tablet 6  . labetalol (NORMODYNE) 300 MG tablet Take 1 tablet (300 mg total) by mouth 2 (two) times daily. 60 tablet 1  . lisinopril (PRINIVIL,ZESTRIL) 40 MG tablet Take 1 tablet (40 mg total) by mouth daily. 30 tablet 6  . rOPINIRole (REQUIP) 3 MG tablet Take 1 tablet (3 mg total) by mouth at bedtime. 30 tablet 0  . simvastatin (ZOCOR) 20 MG tablet Take 1 tablet (20 mg  total) by mouth daily. 30 tablet 3  . triamterene-hydrochlorothiazide (DYAZIDE) 37.5-25 MG per capsule Take 1 each (1 capsule total) by mouth every morning. 30 capsule 1  . zolpidem (AMBIEN) 10 MG tablet Take 1 tablet (10 mg total) by mouth at bedtime. 30 tablet 0   No current facility-administered medications for this visit.    Past Medical History  Diagnosis Date  . Type 2 diabetes mellitus   . Essential hypertension   . Hyperlipidemia   . COPD (chronic obstructive pulmonary disease)   . GERD (gastroesophageal reflux disease)   . CAD (coronary artery disease)     Moderate nonobstructive 2012-2013, [place]  . Hypothyroid   . Lung nodules   . Diverticulosis   . Bipolar affective   . Left-sided weakness 03/20/2014    MRI brain negative for stroke.    Past Surgical History  Procedure Laterality Date  . Back surgery    . Abdominal hysterectomy    . Appendectomy    . Tonsillectomy    . Ankle surgery    . Cholecystectomy    . Nose surgery      History   Social History  . Marital Status: Divorced    Spouse Name: N/A  . Number of Children: N/A  . Years of Education: N/A   Occupational History  .      Disability for back pain  Social History Main Topics  . Smoking status: Current Every Day Smoker -- 1.00 packs/day for 40 years    Types: Cigarettes  . Smokeless tobacco: Never Used  . Alcohol Use: No  . Drug Use: No  . Sexual Activity: Not on file   Other Topics Concern  . Not on file   Social History Narrative     Filed Vitals:   12/06/14 0822  BP: 188/102  Pulse: 78  Height: 5\' 7"  (1.702 m)  Weight: 206 lb (93.441 kg)  SpO2: 98%    PHYSICAL EXAM General: NAD HEENT: Normal. Neck: No JVD, no thyromegaly. Lungs: Clear to auscultation bilaterally with normal respiratory effort. CV: Nondisplaced PMI. Regular rate and rhythm, normal S1/S2, no S3/S4, no murmur. +chest wall tenderness. No pretibial or periankle edema. No carotid bruit. Normal pedal  pulses.  Abdomen: Soft, obese, no distention.  Neurologic: Alert and oriented x 3.  Psych: Normal affect. Skin: Normal. Musculoskeletal: Normal range of motion, no gross deformities. Extremities: No clubbing or cyanosis.   ECG: Most recent ECG reviewed.      ASSESSMENT AND PLAN: 1. Malignant HTN: Remains elevated. Already on clonidine patch (ran out 2 weeks ago), labetalol, lisinopril, isosorbide dinitrate, and Dyazide. Renal artery Dopplers in 06/2014 showed no evidence of stenosis.  Will refill clonidine patch and start hydralazine 25 mg 3 times daily.  2. 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 I will continue isosorbide dinitrate 30 mg bid (will not take tid) and continue labetalol, ASA 81 mg, lisinopril 40 mg, along with statin therapy and amlodipine. Will add hydralazine for BP control. I would not rule out the possibility of coronary angiography.  3. Hypothyroidism: Controlled with normal hormone levels in October 2015. Continue levothyroxine 50 g daily.  4. Hyperlipidemia: Continue therapy with simvastatin 20 mg daily given history of coronary artery disease and history of myocardial infarction.   5. Chronic diastolic heart failure: Euvolemic at present. Takes Lasix prn. Goal is to control BP.  Dispo: f/u 6-8 weeks.   Kate Sable, M.D., F.A.C.C.

## 2014-12-06 NOTE — Procedures (Signed)
  Ina A. Merlene Laughter, MD     [email or web address]           HISTORY: The patient is a 54 year old female who presents with episodes of confusion worrisome for seizures.  MEDICATIONS: Scheduled Meds: Continuous Infusions: PRN Meds:.  Prior to Admission medications   Medication Sig Start Date End Date Taking? Authorizing Provider  amLODipine (NORVASC) 10 MG tablet Take 1 tablet (10 mg total) by mouth daily. 06/05/14   Herminio Commons, MD  aspirin-acetaminophen-caffeine (EXCEDRIN MIGRAINE) [phone] MG per tablet Take 2 tablets by mouth every 6 (six) hours as needed for headache.    Historical Provider, MD  carisoprodol (SOMA) 350 MG tablet Take 350 mg by mouth 4 (four) times daily.  05/27/14   Historical Provider, MD  cloNIDine (CATAPRES - DOSED IN MG/24 HR) 0.3 mg/24hr patch Place 1 patch (0.3 mg total) onto the skin once a week. Sundays 12/06/14   Herminio Commons, MD  dexlansoprazole (DEXILANT) 60 MG capsule Take 1 capsule (60 mg total) by mouth every evening. 03/20/14   Rexene Alberts, MD  EPINEPHrine (EPI-PEN) 0.3 mg/0.3 mL DEVI Inject 0.3 mg into the muscle once as needed (allergic reaction).     Historical Provider, MD  gabapentin (NEURONTIN) 600 MG tablet Take 1 tablet (600 mg total) by mouth 3 (three) times daily. 03/20/14   Rexene Alberts, MD  hydrALAZINE (APRESOLINE) 25 MG tablet Take 1 tablet (25 mg total) by mouth 3 (three) times daily. 12/06/14   Herminio Commons, MD  isosorbide dinitrate (ISORDIL) 30 MG tablet Take 1 tablet (30 mg total) by mouth 2 (two) times daily. 10/01/14   Herminio Commons, MD  labetalol (NORMODYNE) 300 MG tablet Take 1 tablet (300 mg total) by mouth 2 (two) times daily. 03/20/14   Rexene Alberts, MD  lisinopril (PRINIVIL,ZESTRIL) 40 MG tablet Take 1 tablet (40 mg total) by mouth daily. 10/01/14   Herminio Commons, MD  rOPINIRole (REQUIP) 3 MG tablet Take 1 tablet (3 mg total) by mouth at bedtime. 03/20/14   Rexene Alberts, MD   simvastatin (ZOCOR) 20 MG tablet Take 1 tablet (20 mg total) by mouth daily. 04/10/14   Herminio Commons, MD  triamterene-hydrochlorothiazide (DYAZIDE) 37.5-25 MG per capsule Take 1 each (1 capsule total) by mouth every morning. 03/20/14   Rexene Alberts, MD  zolpidem (AMBIEN) 10 MG tablet Take 1 tablet (10 mg total) by mouth at bedtime. 03/20/14   Rexene Alberts, MD      ANALYSIS: A 16 channel recording using standard 10 20 measurements is conducted for 23 minutes. There is a well-formed posterior dominant rhythm of 18 Hz associated with low electrical cortical voltage activity. Awake and drowsy activities are observed. There is significant myogenic and movement artifacts seen throughout the recording. Photic stimulation and hyperventilation are both carried out without abnormal changes in the background activity. There is no focal or lateral slowing. There is no epileptiform activity is observed.   IMPRESSION: This is a normal recording of the awake and drowsy states.      Teliah Buffalo A. Merlene Laughter, M.D.  Diplomate, Tax adviser of Psychiatry and Neurology ( Neurology).

## 2014-12-18 ENCOUNTER — Emergency Department (HOSPITAL_COMMUNITY): Payer: Medicare Other

## 2014-12-18 ENCOUNTER — Observation Stay (HOSPITAL_COMMUNITY)
Admission: EM | Admit: 2014-12-18 | Discharge: 2014-12-19 | Disposition: A | Discharge status: Home / Self Care | Payer: Medicare Other | Attending: Internal Medicine | Admitting: Internal Medicine

## 2014-12-18 ENCOUNTER — Encounter (HOSPITAL_COMMUNITY): Payer: Self-pay | Admitting: Emergency Medicine

## 2014-12-18 DIAGNOSIS — Z720 Tobacco use: Secondary | ICD-10-CM | POA: Diagnosis present

## 2014-12-18 DIAGNOSIS — R079 Chest pain, unspecified: Secondary | ICD-10-CM

## 2014-12-18 DIAGNOSIS — K219 Gastro-esophageal reflux disease without esophagitis: Secondary | ICD-10-CM | POA: Diagnosis not present

## 2014-12-18 DIAGNOSIS — I10 Essential (primary) hypertension: Secondary | ICD-10-CM | POA: Diagnosis not present

## 2014-12-18 DIAGNOSIS — E785 Hyperlipidemia, unspecified: Secondary | ICD-10-CM | POA: Diagnosis not present

## 2014-12-18 DIAGNOSIS — R0789 Other chest pain: Principal | ICD-10-CM | POA: Insufficient documentation

## 2014-12-18 DIAGNOSIS — F319 Bipolar disorder, unspecified: Secondary | ICD-10-CM | POA: Insufficient documentation

## 2014-12-18 DIAGNOSIS — J449 Chronic obstructive pulmonary disease, unspecified: Secondary | ICD-10-CM | POA: Diagnosis not present

## 2014-12-18 DIAGNOSIS — I2510 Atherosclerotic heart disease of native coronary artery without angina pectoris: Secondary | ICD-10-CM | POA: Insufficient documentation

## 2014-12-18 DIAGNOSIS — E039 Hypothyroidism, unspecified: Secondary | ICD-10-CM | POA: Insufficient documentation

## 2014-12-18 DIAGNOSIS — E1140 Type 2 diabetes mellitus with diabetic neuropathy, unspecified: Secondary | ICD-10-CM | POA: Diagnosis present

## 2014-12-18 DIAGNOSIS — Z79899 Other long term (current) drug therapy: Secondary | ICD-10-CM | POA: Diagnosis not present

## 2014-12-18 DIAGNOSIS — I160 Hypertensive urgency: Secondary | ICD-10-CM

## 2014-12-18 DIAGNOSIS — IMO0002 Reserved for concepts with insufficient information to code with codable children: Secondary | ICD-10-CM | POA: Diagnosis present

## 2014-12-18 DIAGNOSIS — E1165 Type 2 diabetes mellitus with hyperglycemia: Secondary | ICD-10-CM

## 2014-12-18 DIAGNOSIS — E119 Type 2 diabetes mellitus without complications: Secondary | ICD-10-CM | POA: Insufficient documentation

## 2014-12-18 LAB — CBC WITH DIFFERENTIAL/PLATELET
Basophils Absolute: 0.1 10*3/uL (ref 0.0–0.1)
Basophils Relative: 1 % (ref 0–1)
Eosinophils Absolute: 0.4 10*3/uL (ref 0.0–0.7)
Eosinophils Relative: 4 % (ref 0–5)
HCT: 38.8 % (ref 36.0–46.0)
Hemoglobin: 13 g/dL (ref 12.0–15.0)
Lymphocytes Relative: 45 % (ref 12–46)
Lymphs Abs: 3.9 10*3/uL (ref 0.7–4.0)
MCH: 29.8 pg (ref 26.0–34.0)
MCHC: 33.5 g/dL (ref 30.0–36.0)
MCV: 89 fL (ref 78.0–100.0)
MONO ABS: 0.6 10*3/uL (ref 0.1–1.0)
MONOS PCT: 6 % (ref 3–12)
Neutro Abs: 3.7 10*3/uL (ref 1.7–7.7)
Neutrophils Relative %: 44 % (ref 43–77)
PLATELETS: 247 10*3/uL (ref 150–400)
RBC: 4.36 MIL/uL (ref 3.87–5.11)
RDW: 14.3 % (ref 11.5–15.5)
WBC: 8.6 10*3/uL (ref 4.0–10.5)

## 2014-12-18 MED ORDER — METHYLPREDNISOLONE SODIUM SUCC 125 MG IJ SOLR
125.0000 mg | Freq: Once | INTRAMUSCULAR | Status: AC
  Administered 2014-12-18: 125 mg via INTRAVENOUS
  Filled 2014-12-18: qty 2

## 2014-12-18 MED ORDER — DIPHENHYDRAMINE HCL 50 MG/ML IJ SOLN
50.0000 mg | Freq: Once | INTRAMUSCULAR | Status: AC
  Administered 2014-12-18: 50 mg via INTRAVENOUS
  Filled 2014-12-18: qty 1

## 2014-12-18 MED ORDER — ONDANSETRON HCL 4 MG/2ML IJ SOLN
4.0000 mg | Freq: Once | INTRAMUSCULAR | Status: AC
  Administered 2014-12-18: 4 mg via INTRAVENOUS
  Filled 2014-12-18: qty 2

## 2014-12-18 NOTE — ED Notes (Signed)
Pt c/o insect bite the rt arm.

## 2014-12-18 NOTE — ED Provider Notes (Signed)
TIME SEEN: This chart was scribed for [place], DO by Julien Nordmann, ED Scribe. This patient was seen in room APA09/APA09 and the patient's care was started at 11:10 PM.   CHIEF COMPLAINT: insect bite  HPI:  HPI Comments: Katrina [name] is a 54 y.o. female who has a hx of COPD, nonobstructive CAD that she reports was found on cardiac catheterization over 4 years ago, hypertension, hyperlipidemia presents to the Emergency Department complaining of an insect bite on her right forearm onset 8 hour ago. Pt states being in a garage going through some boxes when she felt a burning sensation on her arm and felt something sting her. She never saw what insect possibly bit or stung her but she thinks it may have been a spider. She reports that her whole right arm feels as if it is on fire. Pt notes using an epi-pen for food allergies but notes she did not use it today.  States she started having chest burning, nausea.   She also reports that she has had several days of intermittent chest pressure with associated shortness of breath. No radiation of this chest pain. She describes nausea, diaphoresis. No lower extreme swelling or pain. No fever or cough. Reports her last cardiac catheterization was over 4 years ago. No recent stress test. She is also very hypertensive in the emergency department and states often her blood pressure will be in the 220s/120s. States she is on multiple antihypertensives and took them today.  Denies chest pressure currently. Is only having chest burning sensation. No headache or blurry vision. No numbness, tingling or focal weakness.  PCP: Dr. Legrand Rams Her cardiologist is Dr. Bronson Ing   ROS: See HPI Constitutional: no fever  Eyes: no drainage  ENT: no runny nose   Cardiovascular:  no chest pain  Resp: no SOB  GI: no vomiting GU: no dysuria Integumentary: no rash  Allergy: no hives  Musculoskeletal: no leg swelling  Neurological: no slurred speech ROS otherwise  negative  PAST MEDICAL HISTORY/PAST SURGICAL HISTORY:  Past Medical History  Diagnosis Date  . Type 2 diabetes mellitus   . Essential hypertension   . Hyperlipidemia   . COPD (chronic obstructive pulmonary disease)   . GERD (gastroesophageal reflux disease)   . CAD (coronary artery disease)     Moderate nonobstructive 2012-2013, [place]  . Hypothyroid   . Lung nodules   . Diverticulosis   . Bipolar affective   . Left-sided weakness 03/20/2014    MRI brain negative for stroke.    MEDICATIONS:  Prior to Admission medications   Medication Sig Start Date End Date Taking? Authorizing Provider  amLODipine (NORVASC) 10 MG tablet Take 1 tablet (10 mg total) by mouth daily. 06/05/14   Herminio Commons, MD  aspirin-acetaminophen-caffeine (EXCEDRIN MIGRAINE) [phone] MG per tablet Take 2 tablets by mouth every 6 (six) hours as needed for headache.    Historical Provider, MD  carisoprodol (SOMA) 350 MG tablet Take 350 mg by mouth 4 (four) times daily.  05/27/14   Historical Provider, MD  cloNIDine (CATAPRES - DOSED IN MG/24 HR) 0.3 mg/24hr patch Place 1 patch (0.3 mg total) onto the skin once a week. Sundays 12/06/14   Herminio Commons, MD  dexlansoprazole (DEXILANT) 60 MG capsule Take 1 capsule (60 mg total) by mouth every evening. 03/20/14   Rexene Alberts, MD  EPINEPHrine (EPI-PEN) 0.3 mg/0.3 mL DEVI Inject 0.3 mg into the muscle once as needed (allergic reaction).     Historical Provider,  MD  gabapentin (NEURONTIN) 600 MG tablet Take 1 tablet (600 mg total) by mouth 3 (three) times daily. 03/20/14   Rexene Alberts, MD  hydrALAZINE (APRESOLINE) 25 MG tablet Take 1 tablet (25 mg total) by mouth 3 (three) times daily. 12/06/14   Herminio Commons, MD  isosorbide dinitrate (ISORDIL) 30 MG tablet Take 1 tablet (30 mg total) by mouth 2 (two) times daily. 10/01/14   Herminio Commons, MD  labetalol (NORMODYNE) 300 MG tablet Take 1 tablet (300 mg total) by mouth 2 (two) times daily. 03/20/14    Rexene Alberts, MD  lisinopril (PRINIVIL,ZESTRIL) 40 MG tablet Take 1 tablet (40 mg total) by mouth daily. 10/01/14   Herminio Commons, MD  rOPINIRole (REQUIP) 3 MG tablet Take 1 tablet (3 mg total) by mouth at bedtime. 03/20/14   Rexene Alberts, MD  simvastatin (ZOCOR) 20 MG tablet Take 1 tablet (20 mg total) by mouth daily. 04/10/14   Herminio Commons, MD  triamterene-hydrochlorothiazide (DYAZIDE) 37.5-25 MG per capsule Take 1 each (1 capsule total) by mouth every morning. 03/20/14   Rexene Alberts, MD  zolpidem (AMBIEN) 10 MG tablet Take 1 tablet (10 mg total) by mouth at bedtime. 03/20/14   Rexene Alberts, MD    ALLERGIES:  Allergies  Allergen Reactions  . Penicillins Anaphylaxis  . Pineapple Rash  . Strawberry Rash  . Poultry Meal     Allergic to [place]  . Aspartame And Phenylalanine Palpitations    SOCIAL HISTORY:  History  Substance Use Topics  . Smoking status: Current Every Day Smoker -- 1.00 packs/day for 40 years    Types: Cigarettes  . Smokeless tobacco: Never Used  . Alcohol Use: No    FAMILY HISTORY: Family History  Problem Relation Age of Onset  . Coronary artery disease Father 54  . Emphysema Father     EXAM: Triage vitals: BP 190/109 mmHg  Pulse 85  Temp(Src) 98.3 F (36.8 C) (Oral)  Resp 18  Ht 5\' 7"  (1.702 m)  Wt 206 lb (93.441 kg)  BMI 32.26 kg/m2  SpO2 98% CONSTITUTIONAL: Alert and oriented and responds appropriately to questions. Is in no distress currently HEAD: Normocephalic EYES: Conjunctivae clear, PERRL ENT: normal nose; no rhinorrhea; moist mucous membranes; pharynx without lesions noted, no angioedema, normal phonation, no stridor NECK: Supple, no meningismus, no LAD  CARD: RRR; S1 and S2 appreciated; no murmurs, no clicks, no rubs, no gallops RESP: Normal chest excursion without splinting or tachypnea; breath sounds clear and equal bilaterally; no wheezes, no rhonchi, no rales, no hypoxia or respiratory distress, speaking full  sentences ABD/GI: Normal bowel sounds; non-distended; soft, non-tender, no rebound, no guarding, no peritoneal signs BACK:  The back appears normal and is non-tender to palpation, there is no CVA tenderness EXT: Normal ROM in all joints; non-tender to palpation; no edema; normal capillary refill; no cyanosis, no calf tenderness or swelling  , no joint effusions, compartments are soft SKIN: Normal color for age and race; warm 2x1 cm tender erythematous area to the right AC without fluctuance or induration, no hives, no petechia, no purpura, no blisters or desquamation NEURO: Moves all extremities equally, sensation to light touch intact diffusely, cranial nerves II through XII intact PSYCH: The patient's mood and manner are appropriate. Grooming and personal hygiene are appropriate.  MEDICAL DECISION MAKING: Patient here initially presenting with what she thought was an insect bite to the right arm. Lesion is very small, benign. No history of tick bite. No fever. No hives  or other sign of allergic reaction. No sign of cellulitis or abscess on exam. Will give Benadryl and steroid for symptom control.  Patient is also complaining of chest pressure intermittently with shortness of breath, diaphoresis and nausea. She has multiple risk factors for ACS and has a reported history of nonobstructive CAD by catheterization done in [place] 4-5 years ago. No recent provocative testing. She is also extremely hypertensive and is on multiple antihypertensives medications which she states she took today were without relief. On review of her prior vital signs patient does have blood pressures that have been in the 170s/80s before and she is not chronically in the 210/110s she reports. Her EKG shows no new ischemic changes. We'll obtain cardiac labs, chest x-ray and urine to evaluate for signs of end organ damage from her hypertension. Will give dose of IV hydralazine. I feel she will need admission.  ED PROGRESS:  Patient's labs are unremarkable. First troponin is negative. Chest x-ray appears normal with no widened mediastinum. Although she is hypertensive but doubt that she is having dissection at this time. She has equal pulses in all of her extremities and appears comfortable and is currently denying chest pain. We'll continue to monitor her blood pressure and she may need a drip to control her blood pressure. Discussed with Dr. Maudie Mercury with hospitalist service who agrees on admission to step down.     EKG Interpretation  Date/Time:  Wednesday December 18 2014 22:13:25 EDT Ventricular Rate:  87 PR Interval:  173 QRS Duration: 91 QT Interval:  390 QTC Calculation: 469 R Axis:   54 Text Interpretation:  Sinus rhythm Consider left atrial enlargement No significant change since last tracing Confirmed by [place] Rico,  DO, Kasara Schomer ([id number]) on 12/19/2014 12:03:05 AM        CRITICAL CARE Performed by: Nyra Jabs   Total critical care time: 40 minutes   Critical care time was exclusive of separately billable procedures and treating other patients.  Critical care was necessary to treat or prevent imminent or life-threatening deterioration.  Critical care was time spent personally by me on the following activities: development of treatment plan with patient and/or surrogate as well as nursing, discussions with consultants, evaluation of patient's response to treatment, examination of patient, obtaining history from patient or surrogate, ordering and performing treatments and interventions, ordering and review of laboratory studies, ordering and review of radiographic studies, pulse oximetry and re-evaluation of patient's condition.   I personally performed the services described in this documentation, which was scribed in my presence. The recorded information has been reviewed and is accurate.      [place], DO 12/19/14 [phone]

## 2014-12-19 ENCOUNTER — Encounter (HOSPITAL_COMMUNITY): Payer: Self-pay | Admitting: Internal Medicine

## 2014-12-19 DIAGNOSIS — R079 Chest pain, unspecified: Secondary | ICD-10-CM | POA: Diagnosis not present

## 2014-12-19 DIAGNOSIS — Z720 Tobacco use: Secondary | ICD-10-CM

## 2014-12-19 DIAGNOSIS — I10 Essential (primary) hypertension: Secondary | ICD-10-CM | POA: Diagnosis not present

## 2014-12-19 DIAGNOSIS — E1165 Type 2 diabetes mellitus with hyperglycemia: Secondary | ICD-10-CM

## 2014-12-19 DIAGNOSIS — E1140 Type 2 diabetes mellitus with diabetic neuropathy, unspecified: Secondary | ICD-10-CM | POA: Diagnosis not present

## 2014-12-19 LAB — BASIC METABOLIC PANEL
Anion gap: 9 (ref 5–15)
BUN: 22 mg/dL — ABNORMAL HIGH (ref 6–20)
CHLORIDE: 110 mmol/L (ref 101–111)
CO2: 25 mmol/L (ref 22–32)
Calcium: 8.6 mg/dL — ABNORMAL LOW (ref 8.9–10.3)
Creatinine, Ser: 0.99 mg/dL (ref 0.44–1.00)
Glucose, Bld: 88 mg/dL (ref 65–99)
Potassium: 3.6 mmol/L (ref 3.5–5.1)
Sodium: 144 mmol/L (ref 135–145)

## 2014-12-19 LAB — COMPREHENSIVE METABOLIC PANEL
ALT: 19 U/L (ref 14–54)
AST: 18 U/L (ref 15–41)
Albumin: 4.1 g/dL (ref 3.5–5.0)
Alkaline Phosphatase: 84 U/L (ref 38–126)
Anion gap: 8 (ref 5–15)
BUN: 22 mg/dL — ABNORMAL HIGH (ref 6–20)
CALCIUM: 8.9 mg/dL (ref 8.9–10.3)
CO2: 24 mmol/L (ref 22–32)
CREATININE: 0.93 mg/dL (ref 0.44–1.00)
Chloride: 109 mmol/L (ref 101–111)
GFR calc Af Amer: >60 mL/min (ref >60)
GLUCOSE: 106 mg/dL — AB (ref 65–99)
POTASSIUM: 4.1 mmol/L (ref 3.5–5.1)
Sodium: 141 mmol/L (ref 135–145)
Total Bilirubin: 0.3 mg/dL (ref 0.3–1.2)
Total Protein: 7.5 g/dL (ref 6.5–8.1)

## 2014-12-19 LAB — CBC
HCT: 42 % (ref 36.0–46.0)
Hemoglobin: 13.7 g/dL (ref 12.0–15.0)
MCH: 29.1 pg (ref 26.0–34.0)
MCHC: 32.6 g/dL (ref 30.0–36.0)
MCV: 89.4 fL (ref 78.0–100.0)
Platelets: 259 10*3/uL (ref 150–400)
RBC: 4.7 MIL/uL (ref 3.87–5.11)
RDW: 14.2 % (ref 11.5–15.5)
WBC: 7.7 10*3/uL (ref 4.0–10.5)

## 2014-12-19 LAB — TROPONIN I
Troponin I: <0.03 ng/mL (ref <0.031)
Troponin I: <0.03 ng/mL (ref <0.031)

## 2014-12-19 MED ORDER — ACETAMINOPHEN 650 MG RE SUPP: 650.0000 mg | Freq: Four times a day (QID) | RECTAL | Status: DC | PRN

## 2014-12-19 MED ORDER — GABAPENTIN 300 MG PO CAPS
600.0000 mg | ORAL_CAPSULE | Freq: Three times a day (TID) | ORAL | Status: DC
  Administered 2014-12-19: 600 mg via ORAL
  Filled 2014-12-19: qty 2

## 2014-12-19 MED ORDER — SODIUM CHLORIDE 0.9 % IV SOLN: INTRAVENOUS | Status: DC

## 2014-12-19 MED ORDER — ASPIRIN 81 MG PO CHEW
324.0000 mg | CHEWABLE_TABLET | Freq: Once | ORAL | Status: DC
  Filled 2014-12-19: qty 4

## 2014-12-19 MED ORDER — INSULIN ASPART 100 UNIT/ML ~~LOC~~ SOLN: 0.0000 [IU] | Freq: Three times a day (TID) | SUBCUTANEOUS | Status: DC

## 2014-12-19 MED ORDER — NITROGLYCERIN 2 % TD OINT: 1.0000 [in_us] | TOPICAL_OINTMENT | Freq: Three times a day (TID) | TRANSDERMAL | Status: DC

## 2014-12-19 MED ORDER — ENOXAPARIN SODIUM 40 MG/0.4ML ~~LOC~~ SOLN: 40.0000 mg | SUBCUTANEOUS | Status: DC

## 2014-12-19 MED ORDER — ZOLPIDEM TARTRATE 5 MG PO TABS
5.0000 mg | ORAL_TABLET | Freq: Every day | ORAL | Status: DC
  Administered 2014-12-19: 5 mg via ORAL
  Filled 2014-12-19: qty 1

## 2014-12-19 MED ORDER — HYDRALAZINE HCL 20 MG/ML IJ SOLN
10.0000 mg | Freq: Once | INTRAMUSCULAR | Status: AC
  Administered 2014-12-19: 10 mg via INTRAVENOUS
  Filled 2014-12-19: qty 1

## 2014-12-19 MED ORDER — HYDRALAZINE HCL 25 MG PO TABS: 25.0000 mg | ORAL_TABLET | Freq: Three times a day (TID) | ORAL | Status: DC

## 2014-12-19 MED ORDER — CLOPIDOGREL BISULFATE 75 MG PO TABS: 75.0000 mg | ORAL_TABLET | Freq: Every day | ORAL | Status: DC

## 2014-12-19 MED ORDER — AMLODIPINE BESYLATE 5 MG PO TABS: 10.0000 mg | ORAL_TABLET | Freq: Every day | ORAL | Status: DC

## 2014-12-19 MED ORDER — LABETALOL HCL 200 MG PO TABS
300.0000 mg | ORAL_TABLET | Freq: Two times a day (BID) | ORAL | Status: DC
  Filled 2014-12-19: qty 2

## 2014-12-19 MED ORDER — CLONIDINE HCL 0.3 MG/24HR TD PTWK
0.3000 mg | MEDICATED_PATCH | TRANSDERMAL | Status: DC
  Filled 2014-12-19: qty 1

## 2014-12-19 MED ORDER — LISINOPRIL 10 MG PO TABS: 40.0000 mg | ORAL_TABLET | Freq: Every day | ORAL | Status: DC

## 2014-12-19 MED ORDER — CARISOPRODOL 350 MG PO TABS: 350.0000 mg | ORAL_TABLET | Freq: Four times a day (QID) | ORAL | Status: DC

## 2014-12-19 MED ORDER — SODIUM CHLORIDE 0.9 % IJ SOLN: 3.0000 mL | Freq: Two times a day (BID) | INTRAMUSCULAR | Status: DC

## 2014-12-19 MED ORDER — TRIAMTERENE-HCTZ 37.5-25 MG PO CAPS
1.0000 | ORAL_CAPSULE | Freq: Every morning | ORAL | Status: DC
  Filled 2014-12-19 (×2): qty 1

## 2014-12-19 MED ORDER — SIMVASTATIN 20 MG PO TABS: 20.0000 mg | ORAL_TABLET | Freq: Every day | ORAL | Status: DC

## 2014-12-19 MED ORDER — ROPINIROLE HCL 1 MG PO TABS
3.0000 mg | ORAL_TABLET | Freq: Every day | ORAL | Status: DC
  Administered 2014-12-19: 3 mg via ORAL
  Filled 2014-12-19: qty 3

## 2014-12-19 MED ORDER — ACETAMINOPHEN 325 MG PO TABS: 650.0000 mg | ORAL_TABLET | Freq: Four times a day (QID) | ORAL | Status: DC | PRN

## 2014-12-19 MED ORDER — PANTOPRAZOLE SODIUM 40 MG PO TBEC: 40.0000 mg | DELAYED_RELEASE_TABLET | Freq: Every day | ORAL | Status: DC

## 2014-12-19 NOTE — ED Notes (Signed)
Pt refused in and out cath and states she doesn't urinate that often and we will be waiting a long time for a urine sample.

## 2014-12-19 NOTE — H&P (Signed)
Katrina [name] is an 54 y.o. female.    Dr. Legrand Rams (pcp)  Chief Complaint: chest pain HPI: 54 yo female with htn, hyperlipidemia, dm2, apparently came to ER for spider bite and pt states that she has been having chest pain.  Left sided, no radiation.  Started about 3 days ago.  Intermittent. Slight sob. Nothing seems to make better or worse.  Denies fever, chills, cough, palp, n/v, diarrhea, brbpr, black stool.   Past Medical History  Diagnosis Date  . Type 2 diabetes mellitus   . Essential hypertension   . Hyperlipidemia   . COPD (chronic obstructive pulmonary disease)   . GERD (gastroesophageal reflux disease)   . CAD (coronary artery disease)     Moderate nonobstructive 2012-2013, [place]  . Hypothyroid   . Lung nodules   . Diverticulosis   . Bipolar affective   . Left-sided weakness 03/20/2014    MRI brain negative for stroke.    Past Surgical History  Procedure Laterality Date  . Back surgery    . Abdominal hysterectomy    . Appendectomy    . Tonsillectomy    . Ankle surgery    . Cholecystectomy    . Nose surgery      Family History  Problem Relation Age of Onset  . Coronary artery disease Father 24  . Emphysema Father    Social History:  reports that she has been smoking Cigarettes.  She has a 40 pack-year smoking history. She has never used smokeless tobacco. She reports that she does not drink alcohol or use illicit drugs.  Allergies:  Allergies  Allergen Reactions  . Penicillins Anaphylaxis  . Pineapple Rash  . Strawberry Rash  . Poultry Meal     Allergic to [place]  . Aspartame And Phenylalanine Palpitations   Medications reviewed  Results for orders placed or performed during the hospital encounter of 12/18/14 (from the past 48 hour(s))  CBC with Differential     Status: None   Collection Time: 12/18/14 11:28 PM  Result Value Ref Range   WBC 8.6 4.0 - 10.5 K/uL   RBC 4.36 3.87 - 5.11 MIL/uL   Hemoglobin 13.0 12.0 - 15.0 g/dL   HCT 38.8 36.0 -  46.0 %   MCV 89.0 78.0 - 100.0 fL   MCH 29.8 26.0 - 34.0 pg   MCHC 33.5 30.0 - 36.0 g/dL   RDW 14.3 11.5 - 15.5 %   Platelets 247 150 - 400 K/uL   Neutrophils Relative % 44 43 - 77 %   Neutro Abs 3.7 1.7 - 7.7 K/uL   Lymphocytes Relative 45 12 - 46 %   Lymphs Abs 3.9 0.7 - 4.0 K/uL   Monocytes Relative 6 3 - 12 %   Monocytes Absolute 0.6 0.1 - 1.0 K/uL   Eosinophils Relative 4 0 - 5 %   Eosinophils Absolute 0.4 0.0 - 0.7 K/uL   Basophils Relative 1 0 - 1 %   Basophils Absolute 0.1 0.0 - 0.1 K/uL  Basic metabolic panel     Status: Abnormal   Collection Time: 12/18/14 11:28 PM  Result Value Ref Range   Sodium 144 135 - 145 mmol/L   Potassium 3.6 3.5 - 5.1 mmol/L   Chloride 110 101 - 111 mmol/L   CO2 25 22 - 32 mmol/L   Glucose, Bld 88 65 - 99 mg/dL   BUN 22 (H) 6 - 20 mg/dL   Creatinine, Ser 0.99 0.44 - 1.00 mg/dL   Calcium 8.6 (  L) 8.9 - 10.3 mg/dL   GFR calc non Af Amer >60 >60 mL/min   GFR calc Af Amer >60 >60 mL/min    Comment: (NOTE) The eGFR has been calculated using the CKD EPI equation. This calculation has not been validated in all clinical situations. eGFR's persistently <60 mL/min signify possible Chronic Kidney Disease.    Anion gap 9 5 - 15  Troponin I     Status: None   Collection Time: 12/18/14 11:28 PM  Result Value Ref Range   Troponin I <0.03 <0.031 ng/mL    Comment:        NO INDICATION OF MYOCARDIAL INJURY.    Dg Chest 2 View  12/19/2014   CLINICAL DATA:  54 year old female with chest pain  EXAM: CHEST  2 VIEW  COMPARISON:  Chest radiograph dated 10/10/2014  FINDINGS: The heart size and mediastinal contours are within normal limits. Both lungs are clear. The visualized skeletal structures are unremarkable.  IMPRESSION: No active cardiopulmonary disease.   Electronically Signed   By: Anner Crete M.D.   On: 12/19/2014 00:41    Review of Systems  Constitutional: Negative.   HENT: Negative.   Eyes: Negative.   Respiratory: Positive for shortness  of breath. Negative for cough, hemoptysis, sputum production and wheezing.   Cardiovascular: Positive for chest pain. Negative for palpitations, orthopnea, claudication, leg swelling and PND.  Gastrointestinal: Negative.   Genitourinary: Negative.   Musculoskeletal: Negative.   Skin: Negative.   Neurological: Negative.   Endo/Heme/Allergies: Negative.   Psychiatric/Behavioral: Negative.     Blood pressure 194/100, pulse 80, temperature 98.3 F (36.8 C), temperature source Oral, resp. rate 18, height _0  (1.702 m), weight 93.441 kg (206 lb), SpO2 98 %. Physical Exam  Constitutional: She is oriented to person, place, and time. She appears well-developed and well-nourished.  HENT:  Head: Normocephalic and atraumatic.  Mouth/Throat: No oropharyngeal exudate.  Eyes: Conjunctivae and EOM are normal. Pupils are equal, round, and reactive to light. No scleral icterus.  Neck: Normal range of motion. Neck supple. No JVD present. No tracheal deviation present. No thyromegaly present.  Cardiovascular: Normal rate and regular rhythm.  Exam reveals no gallop and no friction rub.   No murmur heard. Respiratory: Breath sounds normal. No respiratory distress. She has no wheezes. She has no rales.  GI: Soft. Bowel sounds are normal. She exhibits no distension. There is no tenderness. There is no rebound and no guarding.  Musculoskeletal: Normal range of motion. She exhibits no edema or tenderness.  Lymphadenopathy:    She has no cervical adenopathy.  Neurological: She is alert and oriented to person, place, and time. She has normal reflexes. She displays normal reflexes. No cranial nerve deficit. She exhibits normal muscle tone. Coordination normal.  Skin: Skin is warm and dry. No rash noted. No erythema. No pallor.  Psychiatric: She has a normal mood and affect. Her behavior is normal. Judgment and thought content normal.     Assessment/Plan Chest pain Tele Trop i q6h x3 Check lipid NPO after  midnite Stress testing in am NTP Plavix, statin, b blocker.  Pt states that shes allerghic to aspirin  Hypertension uncontrolled Ntp Hydralazine 46m iv q6h prn sbp >160  Dm2 fsbs q4h , iss  Tobacco use Pt was counselled for 564m on smoking cessation.   DVT proplyaxis:  SCD,  lovenox Verley Pariseau 12/19/2014, 1:02 AM

## 2014-12-19 NOTE — Progress Notes (Signed)
Patient arrived to floor and very hesitant about admission. Patient repeatedly says she will leave, she can walk home. Began admission with patient, and completed admission history.  Patient continually states she doesn't know why she is admitted, nothing is wrong.  Explained to patient that she was admitted for elevated blood pressure. Patient states her blood pressure is always elevated, and if she died today she would be all right.  Attempted to take patient's blood pressure, patient stated cuff was too tight on her arm and she pulled it off.  Attempted to give patient her blood pressure medicine, patient stated she did not need any blood pressure medicine. Patient is sitting comfortably in room and did allow staff to place her on telemetry. Patient smokes two packs a day, explained to patient that she is unable to leave floor and smoke, and if she were caught smoking security would be called to confiscate cigarettes from patient.  Patient stated she understood but that she had rights and should be able to smoke if she wanted to.  Will continue to monitor patient as much as patient allows.

## 2014-12-19 NOTE — ED Notes (Signed)
Pt refused aspirin. Pt states she cannot take aspirin due to having a hole in her stomach.

## 2014-12-19 NOTE — Progress Notes (Addendum)
Patient in room pacing floor.  Patient had pulled telemetry off.  When asked patient what's wrong, patient states she is ready to go, and no one can stop her from leaving.  Md notified that patient is ready to leave.  Staff tried to encourage patient to staff for atleast a few more hours until primary doctor could see her.  Md on call arrived to floor to try to encourage patient to stay.  Patient stated that nothing could be said to change her mind, and that she was going to leave.  Patient was alert and oriented x 4 at time. Patient signed papers and was escorted off floor by security. MD aware.

## 2014-12-20 LAB — HEMOGLOBIN A1C
Hgb A1c MFr Bld: 6.1 % — ABNORMAL HIGH (ref 4.8–5.6)
MEAN PLASMA GLUCOSE: 128 mg/dL

## 2014-12-24 ENCOUNTER — Telehealth: Payer: Self-pay | Admitting: Cardiovascular Disease

## 2014-12-24 ENCOUNTER — Emergency Department (HOSPITAL_COMMUNITY): Payer: Medicare Other

## 2014-12-24 ENCOUNTER — Encounter (HOSPITAL_COMMUNITY): Payer: Self-pay | Admitting: *Deleted

## 2014-12-24 ENCOUNTER — Observation Stay (HOSPITAL_COMMUNITY)
Admission: EM | Admit: 2014-12-24 | Discharge: 2014-12-25 | Discharge status: Against Medical Advice | Payer: Medicare Other | Attending: Internal Medicine | Admitting: Internal Medicine

## 2014-12-24 DIAGNOSIS — E039 Hypothyroidism, unspecified: Secondary | ICD-10-CM | POA: Insufficient documentation

## 2014-12-24 DIAGNOSIS — Z7982 Long term (current) use of aspirin: Secondary | ICD-10-CM | POA: Diagnosis not present

## 2014-12-24 DIAGNOSIS — R0789 Other chest pain: Secondary | ICD-10-CM | POA: Diagnosis not present

## 2014-12-24 DIAGNOSIS — R29810 Facial weakness: Secondary | ICD-10-CM | POA: Diagnosis not present

## 2014-12-24 DIAGNOSIS — I69354 Hemiplegia and hemiparesis following cerebral infarction affecting left non-dominant side: Secondary | ICD-10-CM | POA: Diagnosis not present

## 2014-12-24 DIAGNOSIS — R251 Tremor, unspecified: Secondary | ICD-10-CM | POA: Diagnosis not present

## 2014-12-24 DIAGNOSIS — I2510 Atherosclerotic heart disease of native coronary artery without angina pectoris: Secondary | ICD-10-CM | POA: Diagnosis not present

## 2014-12-24 DIAGNOSIS — G459 Transient cerebral ischemic attack, unspecified: Secondary | ICD-10-CM | POA: Diagnosis present

## 2014-12-24 DIAGNOSIS — R0602 Shortness of breath: Secondary | ICD-10-CM | POA: Diagnosis not present

## 2014-12-24 DIAGNOSIS — E785 Hyperlipidemia, unspecified: Secondary | ICD-10-CM | POA: Diagnosis not present

## 2014-12-24 DIAGNOSIS — I160 Hypertensive urgency: Secondary | ICD-10-CM | POA: Insufficient documentation

## 2014-12-24 DIAGNOSIS — I5189 Other ill-defined heart diseases: Secondary | ICD-10-CM | POA: Diagnosis present

## 2014-12-24 DIAGNOSIS — G2581 Restless legs syndrome: Secondary | ICD-10-CM | POA: Diagnosis not present

## 2014-12-24 DIAGNOSIS — G4459 Other complicated headache syndrome: Secondary | ICD-10-CM | POA: Diagnosis not present

## 2014-12-24 DIAGNOSIS — R209 Unspecified disturbances of skin sensation: Secondary | ICD-10-CM | POA: Diagnosis not present

## 2014-12-24 DIAGNOSIS — J449 Chronic obstructive pulmonary disease, unspecified: Secondary | ICD-10-CM | POA: Insufficient documentation

## 2014-12-24 DIAGNOSIS — IMO0002 Reserved for concepts with insufficient information to code with codable children: Secondary | ICD-10-CM | POA: Diagnosis present

## 2014-12-24 DIAGNOSIS — K219 Gastro-esophageal reflux disease without esophagitis: Secondary | ICD-10-CM | POA: Diagnosis present

## 2014-12-24 DIAGNOSIS — F17210 Nicotine dependence, cigarettes, uncomplicated: Secondary | ICD-10-CM | POA: Diagnosis not present

## 2014-12-24 DIAGNOSIS — E1165 Type 2 diabetes mellitus with hyperglycemia: Secondary | ICD-10-CM | POA: Insufficient documentation

## 2014-12-24 DIAGNOSIS — I10 Essential (primary) hypertension: Principal | ICD-10-CM | POA: Insufficient documentation

## 2014-12-24 DIAGNOSIS — E1140 Type 2 diabetes mellitus with diabetic neuropathy, unspecified: Secondary | ICD-10-CM | POA: Insufficient documentation

## 2014-12-24 DIAGNOSIS — R202 Paresthesia of skin: Secondary | ICD-10-CM | POA: Diagnosis present

## 2014-12-24 DIAGNOSIS — R079 Chest pain, unspecified: Secondary | ICD-10-CM | POA: Diagnosis not present

## 2014-12-24 LAB — CBC
HCT: 39.6 % (ref 36.0–46.0)
HEMOGLOBIN: 13.1 g/dL (ref 12.0–15.0)
MCH: 29.4 pg (ref 26.0–34.0)
MCHC: 33.1 g/dL (ref 30.0–36.0)
MCV: 88.8 fL (ref 78.0–100.0)
PLATELETS: 276 10*3/uL (ref 150–400)
RBC: 4.46 MIL/uL (ref 3.87–5.11)
RDW: 14.5 % (ref 11.5–15.5)
WBC: 10.4 10*3/uL (ref 4.0–10.5)

## 2014-12-24 LAB — BASIC METABOLIC PANEL
Anion gap: 8 (ref 5–15)
BUN: 17 mg/dL (ref 6–20)
CO2: 24 mmol/L (ref 22–32)
Calcium: 9.2 mg/dL (ref 8.9–10.3)
Chloride: 106 mmol/L (ref 101–111)
Creatinine, Ser: 0.79 mg/dL (ref 0.44–1.00)
GFR calc non Af Amer: >60 mL/min (ref >60)
Glucose, Bld: 106 mg/dL — ABNORMAL HIGH (ref 65–99)
Potassium: 3.9 mmol/L (ref 3.5–5.1)
Sodium: 138 mmol/L (ref 135–145)

## 2014-12-24 LAB — I-STAT TROPONIN, ED: Troponin i, poc: 0.01 ng/mL (ref 0.00–0.08)

## 2014-12-24 NOTE — ED Notes (Signed)
NP at the bedside

## 2014-12-24 NOTE — Telephone Encounter (Signed)
Agree with ER evaluation given severely elevated bp with symptoms  Zandra Abts MD

## 2014-12-24 NOTE — Telephone Encounter (Signed)
Pt says she is having L sided face tingling/numbness, sweats, leg/feet pain, says she doesn't feel bad but Dr. Pura Spice office told her to call us. Told pt to report to ED and offered to call EMS, pt declined and says daughter will take her to [facility]. WIll forward to DOD as Dr. Bronson Ing is off this week

## 2014-12-24 NOTE — ED Provider Notes (Signed)
CSN: [id number]     Arrival date & time 12/24/14  1836 History   First MD Initiated Contact with Patient 12/24/14 2236     Chief Complaint  Patient presents with  . Hypertension  . Chest Pain     (Consider location/radiation/quality/duration/timing/severity/associated sxs/prior Treatment) HPI Comments: This 54 year old female with a history of poorly controlled hypertension despite numerous anti-hypertensive medications.  Recently had an admission to the hospital for hypertensive urgency.  Unfortunately, she signed out AMA.  I did not follow through.  She states that she has been taking all of her medication.  She called her cardiologist today because she was having chest pain which is typical for her left-sided facial numbness and droop, which has resolved and numbness and weakness of both legs.  The left leg has totally resolved, but the right leg is still weak and not "working right" mild shortness of breath with exertion. She has a history of multiple TIAs.  She sees Dr. Earl Lagos who monitors these.  Patient is a 54 y.o. female presenting with hypertension and chest pain. The history is provided by the patient.  Hypertension This is a chronic problem. The current episode started today. The problem occurs intermittently. The problem has been gradually improving. Associated symptoms include chest pain, headaches, numbness and weakness. Pertinent negatives include no abdominal pain, diaphoresis, fever, nausea or visual change. Nothing aggravates the symptoms. She has tried nothing for the symptoms. The treatment provided no relief.  Chest Pain Associated symptoms: headache, numbness, shortness of breath and weakness   Associated symptoms: no abdominal pain, no back pain, no diaphoresis, no fever, no nausea and no palpitations     Past Medical History  Diagnosis Date  . Type 2 diabetes mellitus   . Essential hypertension   . Hyperlipidemia   . COPD (chronic obstructive pulmonary disease)    . GERD (gastroesophageal reflux disease)   . CAD (coronary artery disease)     Moderate nonobstructive 2012-2013, [place]  . Hypothyroid   . Lung nodules   . Diverticulosis   . Bipolar affective   . Left-sided weakness 03/20/2014    MRI brain negative for stroke.   Past Surgical History  Procedure Laterality Date  . Back surgery    . Abdominal hysterectomy    . Appendectomy    . Tonsillectomy    . Ankle surgery    . Cholecystectomy    . Nose surgery     Family History  Problem Relation Age of Onset  . Coronary artery disease Father 35  . Emphysema Father    History  Substance Use Topics  . Smoking status: Current Every Day Smoker -- 2.00 packs/day for 40 years    Types: Cigarettes  . Smokeless tobacco: Never Used  . Alcohol Use: No   OB History    No data available     Review of Systems  Constitutional: Negative for fever and diaphoresis.  Respiratory: Positive for chest tightness and shortness of breath.   Cardiovascular: Positive for chest pain. Negative for palpitations and leg swelling.  Gastrointestinal: Negative for nausea, abdominal pain and abdominal distention.  Musculoskeletal: Negative for back pain.  Neurological: Positive for weakness, numbness and headaches.  All other systems reviewed and are negative.     Allergies  Penicillins; Pineapple; Strawberry; Poultry meal; and Aspartame and phenylalanine  Home Medications   Prior to Admission medications   Medication Sig Start Date End Date Taking? Authorizing Provider  amLODipine (NORVASC) 10 MG tablet Take 1 tablet (10 mg  total) by mouth daily. 06/05/14  Yes Herminio Commons, MD  aspirin-acetaminophen-caffeine (EXCEDRIN MIGRAINE) [id number] MG per tablet Take 2 tablets by mouth every 6 (six) hours as needed for headache.   Yes Historical Provider, MD  carisoprodol (SOMA) 350 MG tablet Take 350 mg by mouth 4 (four) times daily.  05/27/14  Yes Historical Provider, MD  dexlansoprazole (DEXILANT)  60 MG capsule Take 1 capsule (60 mg total) by mouth every evening. 03/20/14  Yes Rexene Alberts, MD  EPINEPHrine (EPI-PEN) 0.3 mg/0.3 mL DEVI Inject 0.3 mg into the muscle once as needed (allergic reaction).    Yes Historical Provider, MD  gabapentin (NEURONTIN) 600 MG tablet Take 1 tablet (600 mg total) by mouth 3 (three) times daily. 03/20/14  Yes Rexene Alberts, MD  hydrALAZINE (APRESOLINE) 25 MG tablet Take 1 tablet (25 mg total) by mouth 3 (three) times daily. 12/06/14  Yes Herminio Commons, MD  isosorbide dinitrate (ISORDIL) 30 MG tablet Take 1 tablet (30 mg total) by mouth 2 (two) times daily. 10/01/14  Yes Herminio Commons, MD  labetalol (NORMODYNE) 300 MG tablet Take 1 tablet (300 mg total) by mouth 2 (two) times daily. 03/20/14  Yes Rexene Alberts, MD  lisinopril (PRINIVIL,ZESTRIL) 40 MG tablet Take 1 tablet (40 mg total) by mouth daily. 10/01/14  Yes Herminio Commons, MD  rOPINIRole (REQUIP) 3 MG tablet Take 1 tablet (3 mg total) by mouth at bedtime. 03/20/14  Yes Rexene Alberts, MD  simvastatin (ZOCOR) 20 MG tablet Take 1 tablet (20 mg total) by mouth daily. 04/10/14  Yes Herminio Commons, MD  triamterene-hydrochlorothiazide (DYAZIDE) 37.5-25 MG per capsule Take 1 each (1 capsule total) by mouth every morning. 03/20/14  Yes Rexene Alberts, MD  zolpidem (AMBIEN) 10 MG tablet Take 1 tablet (10 mg total) by mouth at bedtime. 03/20/14  Yes Rexene Alberts, MD  cloNIDine (CATAPRES - DOSED IN MG/24 HR) 0.3 mg/24hr patch Place 1 patch (0.3 mg total) onto the skin once a week. Sundays Patient not taking: Reported on 12/24/2014 12/06/14   Herminio Commons, MD   BP 192/92 mmHg  Pulse 70  Temp(Src) 98.5 F (36.9 C) (Oral)  Resp 28  SpO2 99% Physical Exam  Constitutional: She appears well-developed and well-nourished.  Eyes: Pupils are equal, round, and reactive to light.  Neck: Normal range of motion.  Cardiovascular: Normal rate and regular rhythm.   Pulmonary/Chest: No respiratory  distress. She has no wheezes. She exhibits no tenderness.  Musculoskeletal: She exhibits no edema or tenderness.  Neurological: She is alert.  Skin: Skin is warm and dry.  Nursing note and vitals reviewed.   ED Course  Procedures (including critical care time) Labs Review Labs Reviewed  BASIC METABOLIC PANEL - Abnormal; Notable for the following:    Glucose, Bld 106 (*)    All other components within normal limits  CBC  I-STAT TROPOININ, ED    Imaging Review Dg Chest 2 View  12/24/2014   CLINICAL DATA:  54 year old female with chest pain and high blood pressure.  EXAM: CHEST  2 VIEW  COMPARISON:  Radiograph dated 12/18/2014  FINDINGS: The heart size and mediastinal contours are within normal limits. Both lungs are clear. The visualized skeletal structures are unremarkable.  IMPRESSION: No active cardiopulmonary disease.   Electronically Signed   By: Anner Crete M.D.   On: 12/24/2014 20:03   Ct Head Wo Contrast  12/24/2014   CLINICAL DATA:  Initial evaluation for a facial droop, elevated blood pressure.  EXAM:  CT HEAD WITHOUT CONTRAST  TECHNIQUE: Contiguous axial images were obtained from the base of the skull through the vertex without intravenous contrast.  COMPARISON:  Prior study from 07/18/2014  FINDINGS: There is no acute intracranial hemorrhage or infarct. No mass lesion or midline shift. Gray-white matter differentiation is well maintained. Ventricles are normal in size without evidence of hydrocephalus. CSF containing spaces are within normal limits. No extra-axial fluid collection. Patchy hypodensity within the periventricular and deep white matter most consistent with chronic small vessel ischemic disease.  The calvarium is intact.  Orbital soft tissues are within normal limits.  Scattered opacity present within the left ethmoidal air cells. Paranasal sinuses are otherwise clear. No mastoid effusion.  Scalp soft tissues are unremarkable.  IMPRESSION: 1. No acute intracranial  process. 2. Mild chronic small vessel ischemic disease.   Electronically Signed   By: Jeannine Boga M.D.   On: 12/24/2014 23:59   Mr Brain Wo Contrast  12/25/2014   CLINICAL DATA:  Initial evaluation for acute facial droop.  EXAM: MRI HEAD WITHOUT CONTRAST  TECHNIQUE: Multiplanar, multiecho pulse sequences of the brain and surrounding structures were obtained without intravenous contrast.  COMPARISON:  Prior CT from 12/24/2014  FINDINGS: Study is limited due to extensive motion artifact and patient's inability to tolerate the full length of the exam.  No abnormal foci of restricted diffusion to suggest acute intracranial infarct. Gray-white differentiation grossly maintained.  No definite mass lesion or mass effect. No midline shift. Ventricles within normal limits for size without evidence of hydrocephalus. No definite extra-axial fluid collection.  Craniocervical junction widely patent.  IMPRESSION: Limited study demonstrating no acute intracranial infarct.   Electronically Signed   By: Jeannine Boga M.D.   On: 12/25/2014 04:55     EKG Interpretation   Date/Time:  Tuesday December 24 2014 19:32:59 EDT Ventricular Rate:  75 PR Interval:  158 QRS Duration: 86 QT Interval:  394 QTC Calculation: 439 R Axis:   71 Text Interpretation:  Normal sinus rhythm Normal ECG Confirmed by OTTER   MD, OLGA ([id number]) on 12/25/2014 12:33:35 AM     ED ECG REPORT   Date: 12/24/2014  Rate: 75  Rhythm: normal sinus rhythm  QRS Axis: normal  Intervals: normal  ST/T Wave abnormalities: normal  Conduction Disutrbances:none  Narrative Interpretation:   Old EKG Reviewed: unchanged  I have personally reviewed the EKG tracing and agree with the computerized printout as noted. I discussed patient's lab results CT results with her.  She agrees to stay with the diagnosis of hypertensive urgency.  She states that she has seen her cardiologist and neurologist and been on every "blood pressure medicine name  two-man" with little effect.  She states her cardiologist.  Last stated that he might try a procedure, placing "something" in her kidney because he feels that the nerve is not conducting correctly and this may be the cause for her uncontrolled hypertension.  She states that her father and mother both had similar situations where they blood pressure wasn't uncontrollable despite numerous medications. She has agreed to stay  MDM   Final diagnoses:  Facial droop  Hypertensive urgency        Junius Creamer, NP 12/25/14 8675  Linton Flemings, MD 12/25/14 [phone]

## 2014-12-24 NOTE — ED Notes (Signed)
Pt states she was at her Dr and her BP was high and was sent to the ED. Also c/o chronic CP from angina.

## 2014-12-24 NOTE — ED Notes (Signed)
Pt states that she cannot take NTG for her angina because of migraines.

## 2014-12-24 NOTE — Telephone Encounter (Signed)
Pt daughter called back to confirm she was taking pt to ED

## 2014-12-24 NOTE — Telephone Encounter (Signed)
Katrina [name] calls the office stating that her BP was 219/109 today while in Dr. Simone Curia office.

## 2014-12-25 ENCOUNTER — Ambulatory Visit (HOSPITAL_COMMUNITY): Payer: Medicare Other | Attending: Emergency Medicine

## 2014-12-25 ENCOUNTER — Ambulatory Visit (HOSPITAL_COMMUNITY): Payer: Medicare Other

## 2014-12-25 ENCOUNTER — Encounter (HOSPITAL_COMMUNITY): Payer: Self-pay | Admitting: Cardiology

## 2014-12-25 DIAGNOSIS — H02402 Unspecified ptosis of left eyelid: Secondary | ICD-10-CM | POA: Diagnosis not present

## 2014-12-25 DIAGNOSIS — G459 Transient cerebral ischemic attack, unspecified: Secondary | ICD-10-CM

## 2014-12-25 DIAGNOSIS — R202 Paresthesia of skin: Secondary | ICD-10-CM

## 2014-12-25 DIAGNOSIS — R079 Chest pain, unspecified: Secondary | ICD-10-CM

## 2014-12-25 DIAGNOSIS — I160 Hypertensive urgency: Secondary | ICD-10-CM | POA: Insufficient documentation

## 2014-12-25 DIAGNOSIS — I10 Essential (primary) hypertension: Secondary | ICD-10-CM | POA: Diagnosis not present

## 2014-12-25 DIAGNOSIS — R4189 Other symptoms and signs involving cognitive functions and awareness: Secondary | ICD-10-CM | POA: Diagnosis not present

## 2014-12-25 DIAGNOSIS — E1140 Type 2 diabetes mellitus with diabetic neuropathy, unspecified: Secondary | ICD-10-CM

## 2014-12-25 DIAGNOSIS — I6789 Other cerebrovascular disease: Secondary | ICD-10-CM

## 2014-12-25 DIAGNOSIS — I519 Heart disease, unspecified: Secondary | ICD-10-CM | POA: Diagnosis not present

## 2014-12-25 DIAGNOSIS — K219 Gastro-esophageal reflux disease without esophagitis: Secondary | ICD-10-CM | POA: Diagnosis not present

## 2014-12-25 DIAGNOSIS — R29810 Facial weakness: Secondary | ICD-10-CM | POA: Diagnosis not present

## 2014-12-25 DIAGNOSIS — E1165 Type 2 diabetes mellitus with hyperglycemia: Secondary | ICD-10-CM

## 2014-12-25 DIAGNOSIS — R0789 Other chest pain: Secondary | ICD-10-CM | POA: Insufficient documentation

## 2014-12-25 LAB — GLUCOSE, CAPILLARY: Glucose-Capillary: 111 mg/dL — ABNORMAL HIGH (ref 65–99)

## 2014-12-25 MED ORDER — ROPINIROLE HCL 1 MG PO TABS
3.0000 mg | ORAL_TABLET | Freq: Every day | ORAL | Status: DC
  Administered 2014-12-25: 3 mg via ORAL
  Filled 2014-12-25: qty 3

## 2014-12-25 MED ORDER — ASPIRIN 300 MG RE SUPP
300.0000 mg | Freq: Every day | RECTAL | Status: DC
Start: 2014-12-25 — End: 2014-12-26
  Filled 2014-12-25: qty 1

## 2014-12-25 MED ORDER — HYDRALAZINE HCL 20 MG/ML IJ SOLN: 10.0000 mg | Freq: Once | INTRAMUSCULAR | Status: DC

## 2014-12-25 MED ORDER — SIMVASTATIN 20 MG PO TABS
20.0000 mg | ORAL_TABLET | Freq: Every day | ORAL | Status: DC
  Filled 2014-12-25: qty 1

## 2014-12-25 MED ORDER — PERFLUTREN LIPID MICROSPHERE
1.0000 mL | INTRAVENOUS | Status: AC | PRN
  Administered 2014-12-25: 2 mL via INTRAVENOUS
  Filled 2014-12-25: qty 10

## 2014-12-25 MED ORDER — AMLODIPINE BESYLATE 10 MG PO TABS
10.0000 mg | ORAL_TABLET | Freq: Every day | ORAL | Status: DC
  Administered 2014-12-25: 10 mg via ORAL
  Filled 2014-12-25: qty 1

## 2014-12-25 MED ORDER — ZOLPIDEM TARTRATE 5 MG PO TABS
10.0000 mg | ORAL_TABLET | Freq: Every day | ORAL | Status: DC
  Administered 2014-12-25: 10 mg via ORAL
  Filled 2014-12-25: qty 2

## 2014-12-25 MED ORDER — PANTOPRAZOLE SODIUM 40 MG PO TBEC
40.0000 mg | DELAYED_RELEASE_TABLET | Freq: Every day | ORAL | Status: DC
  Administered 2014-12-25: 40 mg via ORAL
  Filled 2014-12-25: qty 1

## 2014-12-25 MED ORDER — HYDRALAZINE HCL 25 MG PO TABS
25.0000 mg | ORAL_TABLET | Freq: Three times a day (TID) | ORAL | Status: DC
  Administered 2014-12-25 (×2): 25 mg via ORAL
  Filled 2014-12-25 (×3): qty 1

## 2014-12-25 MED ORDER — LABETALOL HCL 300 MG PO TABS
300.0000 mg | ORAL_TABLET | Freq: Two times a day (BID) | ORAL | Status: DC
Start: 2014-12-25 — End: 2014-12-26
  Administered 2014-12-25: 300 mg via ORAL
  Filled 2014-12-25 (×2): qty 1

## 2014-12-25 MED ORDER — NICOTINE 21 MG/24HR TD PT24
21.0000 mg | MEDICATED_PATCH | Freq: Every day | TRANSDERMAL | Status: DC
  Filled 2014-12-25: qty 1

## 2014-12-25 MED ORDER — LORAZEPAM 2 MG/ML IJ SOLN
2.0000 mg | Freq: Once | INTRAMUSCULAR | Status: AC
  Administered 2014-12-25: 2 mg via INTRAVENOUS
  Filled 2014-12-25: qty 1

## 2014-12-25 MED ORDER — TRIAMTERENE-HCTZ 37.5-25 MG PO CAPS
1.0000 | ORAL_CAPSULE | Freq: Every morning | ORAL | Status: DC
  Administered 2014-12-25: 1 via ORAL
  Filled 2014-12-25: qty 1

## 2014-12-25 MED ORDER — ACETAMINOPHEN 325 MG PO TABS: 650.0000 mg | ORAL_TABLET | ORAL | Status: DC | PRN

## 2014-12-25 MED ORDER — SUMATRIPTAN SUCCINATE 25 MG PO TABS
25.0000 mg | ORAL_TABLET | Freq: Once | ORAL | Status: AC
  Administered 2014-12-25: 25 mg via ORAL
  Filled 2014-12-25 (×2): qty 1

## 2014-12-25 MED ORDER — ASPIRIN 325 MG PO TABS
325.0000 mg | ORAL_TABLET | Freq: Every day | ORAL | Status: DC
  Filled 2014-12-25: qty 1

## 2014-12-25 MED ORDER — ISOSORBIDE DINITRATE 30 MG PO TABS
30.0000 mg | ORAL_TABLET | Freq: Two times a day (BID) | ORAL | Status: DC
  Filled 2014-12-25 (×2): qty 1

## 2014-12-25 MED ORDER — LORAZEPAM 2 MG/ML IJ SOLN
1.0000 mg | Freq: Once | INTRAMUSCULAR | Status: AC
  Administered 2014-12-25: 1 mg via INTRAVENOUS
  Filled 2014-12-25: qty 1

## 2014-12-25 MED ORDER — CARISOPRODOL 350 MG PO TABS
350.0000 mg | ORAL_TABLET | Freq: Four times a day (QID) | ORAL | Status: DC
  Administered 2014-12-25 (×2): 350 mg via ORAL
  Filled 2014-12-25 (×2): qty 1

## 2014-12-25 MED ORDER — ACETAMINOPHEN 650 MG RE SUPP: 650.0000 mg | RECTAL | Status: DC | PRN

## 2014-12-25 MED ORDER — GABAPENTIN 600 MG PO TABS
600.0000 mg | ORAL_TABLET | Freq: Three times a day (TID) | ORAL | Status: DC
  Administered 2014-12-25 (×3): 600 mg via ORAL
  Filled 2014-12-25 (×6): qty 1

## 2014-12-25 MED ORDER — ENOXAPARIN SODIUM 40 MG/0.4ML ~~LOC~~ SOLN
40.0000 mg | SUBCUTANEOUS | Status: DC
  Filled 2014-12-25: qty 0.4

## 2014-12-25 MED ORDER — STROKE: EARLY STAGES OF RECOVERY BOOK
Freq: Once | Status: AC
  Administered 2014-12-25: 1
  Filled 2014-12-25: qty 1

## 2014-12-25 MED ORDER — HYDRALAZINE HCL 20 MG/ML IJ SOLN
10.0000 mg | Freq: Once | INTRAMUSCULAR | Status: AC
  Administered 2014-12-25: 10 mg via INTRAVENOUS
  Filled 2014-12-25: qty 1

## 2014-12-25 MED ORDER — LISINOPRIL 40 MG PO TABS
40.0000 mg | ORAL_TABLET | Freq: Every day | ORAL | Status: DC
  Filled 2014-12-25: qty 1

## 2014-12-25 NOTE — Progress Notes (Addendum)
STROKE TEAM PROGRESS NOTE   SUBJECTIVE (INTERVAL HISTORY) No family is at bedside. Katrina [name] is sitting in chair, ready to drink coffee. Katrina [name] stated that Katrina [name] had "TIA" in the past and followed up with Dr. Merlene Laughter as outpt and Katrina [name] also has baseline left sided weakness and walking difficulty, considering "MS" diagnosis now. Katrina [name] said Katrina [name] has chest pain on and off for 5 days and for the last two days, Katrina [name] has left face, left shoulder, left neck intermittent pain related to chest pain and Katrina [name] has intermittent left arm and less in left leg numbness tingling associated with chest pain. Currently Katrina [name] has no numbness and tingling.   OBJECTIVE Temp:  [97.9 F (36.6 C)-98.5 F (36.9 C)] 97.9 F (36.6 C) (07/20 0727) Pulse Rate:  [70-89] 72 (07/20 1135) Cardiac Rhythm:  [-] Normal sinus rhythm (07/20 0609) Resp:  [15-33] 16 (07/20 0727) BP: (119-207)/(70-102) 191/84 mmHg (07/20 1135) SpO2:  [93 %-100 %] 99 % (07/20 0727) Weight:  [204 lb 9.6 oz (92.806 kg)] 204 lb 9.6 oz (92.806 kg) (07/20 0727)   Recent Labs Lab 12/25/14 1052  GLUCAP 111*    Recent Labs Lab 12/18/14 2328 12/19/14 0225 12/24/14 1942  NA 144 141 138  K 3.6 4.1 3.9  CL 110 109 106  CO2 25 24 24   GLUCOSE 88 106* 106*  BUN 22* 22* 17  CREATININE 0.99 0.93 0.79  CALCIUM 8.6* 8.9 9.2    Recent Labs Lab 12/19/14 0225  AST 18  ALT 19  ALKPHOS 84  BILITOT 0.3  PROT 7.5  ALBUMIN 4.1    Recent Labs Lab 12/18/14 2328 12/19/14 0230 12/24/14 1942  WBC 8.6 7.7 10.4  NEUTROABS 3.7  --   --   HGB 13.0 13.7 13.1  HCT 38.8 42.0 39.6  MCV 89.0 89.4 88.8  PLT 247 259 276    Recent Labs Lab 12/18/14 2328 12/19/14 0225  TROPONINI <0.03 <0.03   No results for input(s): LABPROT, INR in the last 72 hours. No results for input(s): COLORURINE, LABSPEC, [place], GLUCOSEU, HGBUR, BILIRUBINUR, KETONESUR, PROTEINUR, UROBILINOGEN, NITRITE, LEUKOCYTESUR in the last 72 hours.  Invalid input(s): APPERANCEUR     Component Value  Date/Time   CHOL 133 03/19/2014 0449   TRIG 131 03/19/2014 0449   HDL 30* 03/19/2014 0449   CHOLHDL 4.4 03/19/2014 0449   VLDL 26 03/19/2014 0449   LDLCALC 77 03/19/2014 0449   Lab Results  Component Value Date   HGBA1C 6.1* 12/19/2014      Component Value Date/Time   LABOPIA NONE DETECTED 07/06/2014 2105   COCAINSCRNUR NONE DETECTED 07/06/2014 2105   LABBENZ NONE DETECTED 07/06/2014 2105   AMPHETMU NONE DETECTED 07/06/2014 2105   THCU NONE DETECTED 07/06/2014 2105   LABBARB NONE DETECTED 07/06/2014 2105    No results for input(s): ETH in the last 168 hours.  I have personally reviewed the radiological images below and agree with the radiology interpretations.  Dg Chest 2 View  12/24/2014    IMPRESSION: No active cardiopulmonary disease.     Dg Chest 2 View  12/19/2014   IMPRESSION: No active cardiopulmonary disease.     Ct Head Wo Contrast  12/24/2014    IMPRESSION: 1. No acute intracranial process. 2. Mild chronic small vessel ischemic disease.     Mr Brain Wo Contrast  12/25/2014    IMPRESSION: Limited study demonstrating no acute intracranial infarct.   Carotid Doppler  Bilateral: 1-39% ICA stenosis. Vertebral artery flow is antegrade.  2D Echocardiogram  pending  EKG  normal EKG, normal sinus rhythm. For complete results please see formal report.  PHYSICAL EXAM  Temp:  [97.9 F (36.6 C)-98.5 F (36.9 C)] 97.9 F (36.6 C) (07/20 0727) Pulse Rate:  [70-89] 72 (07/20 1135) Resp:  [15-33] 16 (07/20 0727) BP: (119-207)/(70-102) 191/84 mmHg (07/20 1135) SpO2:  [93 %-100 %] 99 % (07/20 0727) Weight:  [204 lb 9.6 oz (92.806 kg)] 204 lb 9.6 oz (92.806 kg) (07/20 0727)  General - Well nourished, well developed, in no apparent distress.  Ophthalmologic - not cooperative on exam.  Cardiovascular - Regular rate and rhythm with no murmur.  Neck - supple, no carotid bruits  Mental Status -  Level of arousal and orientation to year, place, and current president  were intact, but not to month. Language including expression, naming, repetition, comprehension was assessed and found intact. Attention span and concentration were normal with WORLD back-spelling and calculation. Fund of Knowledge was assessed and was impaired without knowing previous presidents.  Cranial Nerves II - XII - II - Visual field intact OU. III, IV, VI - Extraocular movements intact. V - Facial sensation intact bilaterally. VII - Facial movement intact bilaterally. VIII - Hearing & vestibular intact bilaterally. X - Palate elevates symmetrically. XI - Chin turning & shoulder shrug intact bilaterally. XII - Tongue protrusion intact.  Motor Strength - The patient's strength was normal in all extremities and pronator drift was absent.  Bulk was normal and fasciculations were absent.  During exam, pt showed some left arm and leg weakness which is effort related. With distraction, pt showed no left arm weakness. Not able to do Hoover sign as pt refused to lie in bed. However, pt is able to walk in the hall way without assistance during exam.  Motor Tone - Muscle tone was assessed at the neck and appendages and was normal.  Reflexes - The patient's reflexes were symmetrical in all extremities and Katrina [name] had no pathological reflexes.  Sensory - Light touch, temperature/pinprick were assessed and were symmetrical.    Coordination - The patient had normal movements in the hands and feet with no ataxia or dysmetria.  Tremor was absent.  Gait and Station - walks in the hallway without assistance, slow and broad based gait. Initially, Katrina [name] tends to limp on the left but as walking goes on, left side limp resolved.  ASSESSMENT/PLAN Katrina [name] is a 54 y.o. female with history of HTN and HLD presenting with elevated blood pressure and chest pain. Symptoms on and off. Has been following with Dr. Merlene Laughter for "TIA" but now changing to diagnosis of "MS".     Chest pain related left sided  numbness vs. Conversion disorder  Pt exam showed functional component and effort related. No reliable motor sensory deficit on exam.   MRI  Limited sequences, but no acute stroke  MRA  Not able to tolerate  Carotid Doppler  unremarkable  2D Echo  pending  LDL 77  HgbA1c 6.1  lovenox for VTE prophylaxis  Diet Heart Room service appropriate?: Yes; Fluid consistency:: Thin   no antithrombotic prior to admission, now on aspirin 325 mg orally every day. Continue ASA on discharge.   Patient counseled to be compliant with her antithrombotic medications  Ongoing aggressive stroke risk factor management  Therapy recommendations:  pending  Disposition:  Pending Neurology will sign off. Please call with questions. Pt will continue to follow up with her Neurologist Dr. Merlene Laughter as outpt.  Hypertension  Home meds:   norvasc  and lisinopril BP goal normotensive Currently on home meds  Unstable, BP fluctuate  Patient counseled to be compliant with her blood pressure medications  Hyperlipidemia  Home meds:  zocor   Currently on home meds  LDL 77, goal < 70  Continue statin at discharge  Tobacco abuse  Current smoker  Smoking cessation counseling provided  Other Stroke Risk Factors  Obesity, Body mass index is 32.04 kg/(m^2).   ? Coronary artery disease  Other Active Problems  Chest pain under work up - normal troponin  Other Pertinent History    Hospital day #   Neurology will sign off. Please call with questions. Pt will continue to follow up with her Neurologist Dr. Merlene Laughter as outpt. Thanks for the consult.  Rosalin Hawking, MD PhD Stroke Neurology 12/25/2014 1:21 PM    To contact Stroke Continuity provider, please refer to [email or web address] After hours, contact General Neurology

## 2014-12-25 NOTE — Progress Notes (Signed)
  Echocardiogram 2D Echocardiogram with Definity has been performed.  Diamond Nickel 12/25/2014, 3:48 PM

## 2014-12-25 NOTE — Progress Notes (Signed)
   12/25/14 2039  Vitals  Temp 98.1 F (36.7 C)  Temp Source Oral  BP (!) 205/112 mmHg  BP Location Left Arm  BP Method Automatic  Patient Position (if appropriate) Sitting  Pulse Rate 83  Pulse Rate Source Dinamap  Resp 18  Oxygen Therapy  SpO2 99 %  O2 Device Room Air  Pt refusing all her scheduled blood pressure pill, verbalized that her SBP in the 200's is normal, agreed to take the IV hydralazine 10mg . Raliegh Ip Schorr NP on call made aware.

## 2014-12-25 NOTE — Progress Notes (Signed)
[facility] PROGRESS NOTE  NOORAH GIAMMONA ELF:[id number] DOB: 1961/04/11 DOA: 12/24/2014 PCP: Rosita Fire, MD  Assessment/Plan: 1. Left-sided weakness. -Patient having a history of CVA with residual left-sided weakness. -Initial workup included a CT scan of brain that was negative. This further worked up with an MRI which was negative for acute infarct. -Pending transthoracic echocardiogram and carotid Dopplers. -Could be related to TIA.   2.  Accelerated hypertension. -Initially she presented with systolic blood pressures in the 200 range. She reports that her blood pressures at baseline usually maintaining in the 200 range. I suspect medication nonadherence. -She was restarted on hydralazine 25 mg by mouth 3 times a day, isosorbide dinitrate 30 mg by mouth twice a day, labetalol 300 mg by mouth twice a day, lisinopril 40 mg by mouth daily and amlodipine 10 mg by mouth daily. Blood pressures trending down.  3.  Chest pain. -Patient reporting chest pain that is increasing in frequency, actively complaining of chest pain during my evaluation. -EKG did not reveal acute ischemic changes, troponins remained negative 3 sets -She is a patient of Dr. Bronson Ing at the cardiology clinic. -Cardiology consulted  4.  Diet-controlled Type 2 diabetes mellitus. -Blood sugar stable continue to monitor.  5.  Dyslipidemia -Continue Zocor 20 mg by mouth daily  Code Status: Full Code Family Communication:  Disposition Plan:    Consultants:  Cardiology  Neurology    HPI/Subjective: Patient is a 54-year-old female with a past medical history of hypertension, history of CVA and recurrent TIAs who was admitted to the medicine service on 12/25/2014, presented with complaints of left-sided weakness, found to have elevated blood pressures. It was suspected that medication nonadherence contributed to hypertension. Initial workup included a CT scan of brain without contrast that did not show  acute intracranial process. This further worked up with an MRI of brain that was negative for acute infarct. Patient was seen and evaluated by neurology. From a cardiac standpoint she reported having increasing frequency of chest pain, and reported having chest pain at the time of my evaluation. Cardiology was consulted. She follows Dr Bronson Ing of cardiology in the outpatient setting.  Objective: Filed Vitals:   12/25/14 1135  BP: 191/84  Pulse: 72  Temp:   Resp:     Intake/Output Summary (Last 24 hours) at 12/25/14 1257 Last data filed at 12/25/14 1135  Gross per 24 hour  Intake    655 ml  Output      0 ml  Net    655 ml   Filed Weights   12/25/14 0727  Weight: 92.806 kg (204 lb 9.6 oz)    Exam:   General:  No acute distress, patient is awake and alert, reports ongoing chest pain on and off throughout this hospitalization  Cardiovascular: 26 systolic ejection murmur, regular rate and rhythm normal S1-S2, no extremity edema  Respiratory: Normal respiratory effort, lungs are clear to auscultation bilateral  Abdomen: Soft nontender nondistended  Musculoskeletal: No edema  Neurological: She has 3-5 muscle strength to her left lower extremity, 4-5 muscle strength to her left upper extremity, no slurred speech or facial droop  Data Reviewed: Basic Metabolic Panel:  Recent Labs Lab 12/18/14 2328 12/19/14 0225 12/24/14 1942  NA 144 141 138  K 3.6 4.1 3.9  CL 110 109 106  CO2 25 24 24   GLUCOSE 88 106* 106*  BUN 22* 22* 17  CREATININE 0.99 0.93 0.79  CALCIUM 8.6* 8.9 9.2   Liver Function Tests:  Recent Labs Lab 12/19/14  0225  AST 18  ALT 19  ALKPHOS 84  BILITOT 0.3  PROT 7.5  ALBUMIN 4.1   No results for input(s): LIPASE, AMYLASE in the last 168 hours. No results for input(s): AMMONIA in the last 168 hours. CBC:  Recent Labs Lab 12/18/14 2328 12/19/14 0230 12/24/14 1942  WBC 8.6 7.7 10.4  NEUTROABS 3.7  --   --   HGB 13.0 13.7 13.1  HCT 38.8  42.0 39.6  MCV 89.0 89.4 88.8  PLT 247 259 276   Cardiac Enzymes:  Recent Labs Lab 12/18/14 2328 12/19/14 0225  TROPONINI <0.03 <0.03   BNP (last 3 results) No results for input(s): BNP in the last 8760 hours.  ProBNP (last 3 results) No results for input(s): PROBNP in the last 8760 hours.  CBG:  Recent Labs Lab 12/25/14 1052  GLUCAP 111*    No results found for this or any previous visit (from the past 240 hour(s)).   Studies: Dg Chest 2 View  12/24/2014   CLINICAL DATA:  54 year old female with chest pain and high blood pressure.  EXAM: CHEST  2 VIEW  COMPARISON:  Radiograph dated 12/18/2014  FINDINGS: The heart size and mediastinal contours are within normal limits. Both lungs are clear. The visualized skeletal structures are unremarkable.  IMPRESSION: No active cardiopulmonary disease.   Electronically Signed   By: Anner Crete M.D.   On: 12/24/2014 20:03   Ct Head Wo Contrast  12/24/2014   CLINICAL DATA:  Initial evaluation for a facial droop, elevated blood pressure.  EXAM: CT HEAD WITHOUT CONTRAST  TECHNIQUE: Contiguous axial images were obtained from the base of the skull through the vertex without intravenous contrast.  COMPARISON:  Prior study from 07/18/2014  FINDINGS: There is no acute intracranial hemorrhage or infarct. No mass lesion or midline shift. Gray-white matter differentiation is well maintained. Ventricles are normal in size without evidence of hydrocephalus. CSF containing spaces are within normal limits. No extra-axial fluid collection. Patchy hypodensity within the periventricular and deep white matter most consistent with chronic small vessel ischemic disease.  The calvarium is intact.  Orbital soft tissues are within normal limits.  Scattered opacity present within the left ethmoidal air cells. Paranasal sinuses are otherwise clear. No mastoid effusion.  Scalp soft tissues are unremarkable.  IMPRESSION: 1. No acute intracranial process. 2. Mild chronic  small vessel ischemic disease.   Electronically Signed   By: Jeannine Boga M.D.   On: 12/24/2014 23:59   Mr Brain Wo Contrast  12/25/2014   CLINICAL DATA:  Initial evaluation for acute facial droop.  EXAM: MRI HEAD WITHOUT CONTRAST  TECHNIQUE: Multiplanar, multiecho pulse sequences of the brain and surrounding structures were obtained without intravenous contrast.  COMPARISON:  Prior CT from 12/24/2014  FINDINGS: Study is limited due to extensive motion artifact and patient's inability to tolerate the full length of the exam.  No abnormal foci of restricted diffusion to suggest acute intracranial infarct. Gray-white differentiation grossly maintained.  No definite mass lesion or mass effect. No midline shift. Ventricles within normal limits for size without evidence of hydrocephalus. No definite extra-axial fluid collection.  Craniocervical junction widely patent.  IMPRESSION: Limited study demonstrating no acute intracranial infarct.   Electronically Signed   By: Jeannine Boga M.D.   On: 12/25/2014 04:55    Scheduled Meds: . amLODipine  10 mg Oral Daily  . aspirin  300 mg Rectal Daily   Or  . aspirin  325 mg Oral Daily  . carisoprodol  350 mg Oral QID  . enoxaparin (LOVENOX) injection  40 mg Subcutaneous Q24H  . gabapentin  600 mg Oral TID  . hydrALAZINE  25 mg Oral TID  . isosorbide dinitrate  30 mg Oral BID  . labetalol  300 mg Oral BID  . lisinopril  40 mg Oral Daily  . nicotine  21 mg Transdermal Daily  . pantoprazole  40 mg Oral Daily  . rOPINIRole  3 mg Oral QHS  . simvastatin  20 mg Oral q1800  . triamterene-hydrochlorothiazide  1 capsule Oral q morning - 10a  . zolpidem  10 mg Oral QHS   Continuous Infusions:   Principal Problem:   Accelerated hypertension Active Problems:   GERD (gastroesophageal reflux disease)   Diastolic dysfunction   Type 2 diabetes, uncontrolled, with neuropathy   Morbid obesity   Paresthesias   TIA (transient ischemic  attack)    Time spent: 35 min    Kelvin Cellar  [facility] Pager [phone]. If 7PM-7AM, please contact night-coverage at [email or web address], password [facility] 12/25/2014, 12:57 PM

## 2014-12-25 NOTE — Progress Notes (Signed)
Pt took cardiac monitor off, refusing to use it, CN talk to patient but insisted of going AMA, advised the risk of going home but pt insisted and pulled IV out. Pt signed AMA and left the floor.

## 2014-12-25 NOTE — Consult Note (Signed)
Primary cardiologist: Dr Bronson Ing  HPI: 54 year old female with past medical history of diabetes mellitus, malignant hypertension, hyperlipidemia, hypothyroid, bipolar disorder, nonobstructive coronary disease by cardiac catheterization in 2013 for evaluation of chest pain. Patient has had intermittent chest pain for several years. It is under the left breast and radiates to above the breast. It lasts anywhere from 2 min to 2 hours. Not exertional or related to food. Increases with inspiration and certain movements. Patient was seen at a neurologist office yesterday and blood pressure noted to be elevated. She was asked to come to the emergency room. She has been evaluated for left-sided weakness by neurology. Cardiology asked to evaluate for chest pain.  Medications Prior to Admission  Medication Sig Dispense Refill  . amLODipine (NORVASC) 10 MG tablet Take 1 tablet (10 mg total) by mouth daily. 30 tablet 6  . aspirin-acetaminophen-caffeine (EXCEDRIN MIGRAINE) 017-510-25 MG per tablet Take 2 tablets by mouth every 6 (six) hours as needed for headache.    . carisoprodol (SOMA) 350 MG tablet Take 350 mg by mouth 4 (four) times daily.   3  . dexlansoprazole (DEXILANT) 60 MG capsule Take 1 capsule (60 mg total) by mouth every evening. 30 capsule 1  . EPINEPHrine (EPI-PEN) 0.3 mg/0.3 mL DEVI Inject 0.3 mg into the muscle once as needed (allergic reaction).     . gabapentin (NEURONTIN) 600 MG tablet Take 1 tablet (600 mg total) by mouth 3 (three) times daily. 90 tablet 1  . hydrALAZINE (APRESOLINE) 25 MG tablet Take 1 tablet (25 mg total) by mouth 3 (three) times daily. 90 tablet 6  . isosorbide dinitrate (ISORDIL) 30 MG tablet Take 1 tablet (30 mg total) by mouth 2 (two) times daily. 60 tablet 6  . labetalol (NORMODYNE) 300 MG tablet Take 1 tablet (300 mg total) by mouth 2 (two) times daily. 60 tablet 1  . lisinopril (PRINIVIL,ZESTRIL) 40 MG tablet Take 1 tablet (40 mg total) by mouth daily. 30  tablet 6  . rOPINIRole (REQUIP) 3 MG tablet Take 1 tablet (3 mg total) by mouth at bedtime. 30 tablet 0  . simvastatin (ZOCOR) 20 MG tablet Take 1 tablet (20 mg total) by mouth daily. 30 tablet 3  . triamterene-hydrochlorothiazide (DYAZIDE) 37.5-25 MG per capsule Take 1 each (1 capsule total) by mouth every morning. 30 capsule 1  . zolpidem (AMBIEN) 10 MG tablet Take 1 tablet (10 mg total) by mouth at bedtime. 30 tablet 0  . cloNIDine (CATAPRES - DOSED IN MG/24 HR) 0.3 mg/24hr patch Place 1 patch (0.3 mg total) onto the skin once a week. Sundays (Patient not taking: Reported on 12/24/2014) 4 patch 6    Allergies  Allergen Reactions  . Penicillins Anaphylaxis  . Pineapple Rash  . Strawberry Rash  . Poultry Meal     Allergic to [place]  . Aspartame And Phenylalanine Palpitations     Past Medical History  Diagnosis Date  . Type 2 diabetes mellitus   . Essential hypertension   . Hyperlipidemia   . COPD (chronic obstructive pulmonary disease)   . GERD (gastroesophageal reflux disease)   . CAD (coronary artery disease)     Moderate nonobstructive 2012-2013, [place]  . Hypothyroid   . Lung nodules   . Diverticulosis   . Bipolar affective   . Left-sided weakness 03/20/2014    MRI brain negative for stroke.    Past Surgical History  Procedure Laterality Date  . Back surgery    . Abdominal hysterectomy    .  Appendectomy    . Tonsillectomy    . Ankle surgery    . Cholecystectomy    . Nose surgery      History   Social History  . Marital Status: Divorced    Spouse Name: N/A  . Number of Children: 3  . Years of Education: N/A   Occupational History  .      Disability for back pain   Social History Main Topics  . Smoking status: Current Every Day Smoker -- 2.00 packs/day for 40 years    Types: Cigarettes  . Smokeless tobacco: Never Used  . Alcohol Use: No  . Drug Use: No  . Sexual Activity: Not on file   Other Topics Concern  . Not on file   Social History  Narrative    Family History  Problem Relation Age of Onset  . Coronary artery disease Father 35  . Emphysema Father     ROS:  no fevers or chills, productive cough, hemoptysis, dysphasia, odynophagia, melena, hematochezia, dysuria, hematuria, rash, seizure activity, orthopnea, PND, pedal edema, claudication. Remaining systems are negative.  Physical Exam:   Blood pressure 191/84, pulse 72, temperature 97.9 F (36.6 C), temperature source Oral, resp. rate 16, height _0  (1.702 m), weight 204 lb 9.6 oz (92.806 kg), SpO2 99 %.  General:  Well developed/well nourished in NAD Skin warm/dry Patient not depressed No peripheral clubbing Back-normal HEENT-normal/normal eyelids Neck supple/normal carotid upstroke bilaterally; no bruits; no JVD; no thyromegaly chest - CTA/ normal expansion CV - RRR/normal S1 and S2; no murmurs, rubs or gallops;  PMI nondisplaced Abdomen -NT/ND, no HSM, no mass, + bowel sounds, no bruit 2+ femoral pulses, no bruits Ext-no edema, chords, 2+ DP Neuro-grossly nonfocal  ECG NSR, no ST changes  Results for orders placed or performed during the hospital encounter of 12/24/14 (from the past 48 hour(s))  Basic metabolic panel     Status: Abnormal   Collection Time: 12/24/14  7:42 PM  Result Value Ref Range   Sodium 138 135 - 145 mmol/L   Potassium 3.9 3.5 - 5.1 mmol/L   Chloride 106 101 - 111 mmol/L   CO2 24 22 - 32 mmol/L   Glucose, Bld 106 (H) 65 - 99 mg/dL   BUN 17 6 - 20 mg/dL   Creatinine, Ser 0.79 0.44 - 1.00 mg/dL   Calcium 9.2 8.9 - 10.3 mg/dL   GFR calc non Af Amer >60 >60 mL/min   GFR calc Af Amer >60 >60 mL/min    Comment: (NOTE) The eGFR has been calculated using the CKD EPI equation. This calculation has not been validated in all clinical situations. eGFR's persistently <60 mL/min signify possible Chronic Kidney Disease.    Anion gap 8 5 - 15  CBC     Status: None   Collection Time: 12/24/14  7:42 PM  Result Value Ref Range   WBC  10.4 4.0 - 10.5 K/uL   RBC 4.46 3.87 - 5.11 MIL/uL   Hemoglobin 13.1 12.0 - 15.0 g/dL   HCT 39.6 36.0 - 46.0 %   MCV 88.8 78.0 - 100.0 fL   MCH 29.4 26.0 - 34.0 pg   MCHC 33.1 30.0 - 36.0 g/dL   RDW 14.5 11.5 - 15.5 %   Platelets 276 150 - 400 K/uL  I-stat troponin, ED     Status: None   Collection Time: 12/24/14  7:49 PM  Result Value Ref Range   Troponin i, poc 0.01 0.00 - 0.08 ng/mL  Comment 3            Comment: Due to the release kinetics of cTnI, a negative result within the first hours of the onset of symptoms does not rule out myocardial infarction with certainty. If myocardial infarction is still suspected, repeat the test at appropriate intervals.   Glucose, capillary     Status: Abnormal   Collection Time: 12/25/14 10:52 AM  Result Value Ref Range   Glucose-Capillary 111 (H) 65 - 99 mg/dL    Dg Chest 2 View  12/24/2014   CLINICAL DATA:  54 year old female with chest pain and high blood pressure.  EXAM: CHEST  2 VIEW  COMPARISON:  Radiograph dated 12/18/2014  FINDINGS: The heart size and mediastinal contours are within normal limits. Both lungs are clear. The visualized skeletal structures are unremarkable.  IMPRESSION: No active cardiopulmonary disease.   Electronically Signed   By: Anner Crete M.D.   On: 12/24/2014 20:03   Ct Head Wo Contrast  12/24/2014   CLINICAL DATA:  Initial evaluation for a facial droop, elevated blood pressure.  EXAM: CT HEAD WITHOUT CONTRAST  TECHNIQUE: Contiguous axial images were obtained from the base of the skull through the vertex without intravenous contrast.  COMPARISON:  Prior study from 07/18/2014  FINDINGS: There is no acute intracranial hemorrhage or infarct. No mass lesion or midline shift. Gray-white matter differentiation is well maintained. Ventricles are normal in size without evidence of hydrocephalus. CSF containing spaces are within normal limits. No extra-axial fluid collection. Patchy hypodensity within the periventricular  and deep white matter most consistent with chronic small vessel ischemic disease.  The calvarium is intact.  Orbital soft tissues are within normal limits.  Scattered opacity present within the left ethmoidal air cells. Paranasal sinuses are otherwise clear. No mastoid effusion.  Scalp soft tissues are unremarkable.  IMPRESSION: 1. No acute intracranial process. 2. Mild chronic small vessel ischemic disease.   Electronically Signed   By: Jeannine Boga M.D.   On: 12/24/2014 23:59   Mr Brain Wo Contrast  12/25/2014   CLINICAL DATA:  Initial evaluation for acute facial droop.  EXAM: MRI HEAD WITHOUT CONTRAST  TECHNIQUE: Multiplanar, multiecho pulse sequences of the brain and surrounding structures were obtained without intravenous contrast.  COMPARISON:  Prior CT from 12/24/2014  FINDINGS: Study is limited due to extensive motion artifact and patient's inability to tolerate the full length of the exam.  No abnormal foci of restricted diffusion to suggest acute intracranial infarct. Gray-white differentiation grossly maintained.  No definite mass lesion or mass effect. No midline shift. Ventricles within normal limits for size without evidence of hydrocephalus. No definite extra-axial fluid collection.  Craniocervical junction widely patent.  IMPRESSION: Limited study demonstrating no acute intracranial infarct.   Electronically Signed   By: Jeannine Boga M.D.   On: 12/25/2014 04:55    Assessment/Plan 1 chest pain-symptoms are extremely atypical and do not sound cardiac. Potentially musculoskeletal. Reproduced with palpation. Enzymes negative. Electrocardiogram with no ST changes. Patient can follow-up with Dr. Bronson Ing following discharge for further evaluation. Can consider functional study as an outpatient. 2 hypertension-agree with reinitiating medications. Can advance hydralazine as needed to help with blood pressure control. Would avoid clonidine given history of intermittent compliance.  There is risk of rebound hypertension with abrupt discontinuation of this medication. Patient may require minoxidil in the future if blood pressure not controlled with present regimen. 3 tobacco abuse-patient counseled on discontinuing. However she states "I don't want to quit smoking". 4 history  of nonobstructive coronary disease-continue aspirin and statin. 5 hyperlipidemia-continue statin. 6 question TIA-neurology evaluating.  Kirk Ruths MD 12/25/2014, 1:44 PM

## 2014-12-25 NOTE — Progress Notes (Signed)
Bedside 2-D Echo in progress 

## 2014-12-25 NOTE — ED Notes (Signed)
Patient transported to MRI 

## 2014-12-25 NOTE — H&P (Signed)
[facility] History and Physical  Patient: Katrina [name]  MRN: [id number]  DOB: 06/16/1960  DOS: the patient was seen and examined on 12/25/2014 PCP: Rosita Fire, MD  Referring physician: Dr. Sharol Given Chief Complaint: Elevated blood pressure  HPI: Katrina [name] is a 53 y.o. female with Past medical history of diabetes mellitus, active smoker, hypertension, COPD, GERD, coronary artery disease, morbid obesity, hypothyroidism. The patient is presenting with complaints of elevated blood pressure as well as chest pain. She was found to be having some left-sided weakness. She has history of on and off left-sided weakness in the past and has more frequent episodes last week. She is not on any daily indicated medication. She also has elevated blood pressure progressively worsening over last week. She mentions she was taken off of clonidine, hydralazine, isosorbide mononitrate although I cannot find any documentation related to that.  The patient is coming from home.  At her baseline ambulates without any support And is independent for most of her ADL manages her medication on her own.  Review of Systems: as mentioned in the history of present illness.  A comprehensive review of the other systems is negative.  Past Medical History  Diagnosis Date  . Type 2 diabetes mellitus   . Essential hypertension   . Hyperlipidemia   . COPD (chronic obstructive pulmonary disease)   . GERD (gastroesophageal reflux disease)   . CAD (coronary artery disease)     Moderate nonobstructive 2012-2013, [place]  . Hypothyroid   . Lung nodules   . Diverticulosis   . Bipolar affective   . Left-sided weakness 03/20/2014    MRI brain negative for stroke.   Past Surgical History  Procedure Laterality Date  . Back surgery    . Abdominal hysterectomy    . Appendectomy    . Tonsillectomy    . Ankle surgery    . Cholecystectomy    . Nose surgery     Social History:  reports that she has been  smoking Cigarettes.  She has a 80 pack-year smoking history. She has never used smokeless tobacco. She reports that she does not drink alcohol or use illicit drugs.  Allergies  Allergen Reactions  . Penicillins Anaphylaxis  . Pineapple Rash  . Strawberry Rash  . Poultry Meal     Allergic to [place]  . Aspartame And Phenylalanine Palpitations    Family History  Problem Relation Age of Onset  . Coronary artery disease Father 44  . Emphysema Father     Prior to Admission medications   Medication Sig Start Date End Date Taking? Authorizing Provider  amLODipine (NORVASC) 10 MG tablet Take 1 tablet (10 mg total) by mouth daily. 06/05/14  Yes Herminio Commons, MD  aspirin-acetaminophen-caffeine (EXCEDRIN MIGRAINE) [phone] MG per tablet Take 2 tablets by mouth every 6 (six) hours as needed for headache.   Yes Historical Provider, MD  carisoprodol (SOMA) 350 MG tablet Take 350 mg by mouth 4 (four) times daily.  05/27/14  Yes Historical Provider, MD  dexlansoprazole (DEXILANT) 60 MG capsule Take 1 capsule (60 mg total) by mouth every evening. 03/20/14  Yes Rexene Alberts, MD  EPINEPHrine (EPI-PEN) 0.3 mg/0.3 mL DEVI Inject 0.3 mg into the muscle once as needed (allergic reaction).    Yes Historical Provider, MD  gabapentin (NEURONTIN) 600 MG tablet Take 1 tablet (600 mg total) by mouth 3 (three) times daily. 03/20/14  Yes Rexene Alberts, MD  hydrALAZINE (APRESOLINE) 25 MG tablet Take 1 tablet (25 mg total) by  mouth 3 (three) times daily. 12/06/14  Yes Herminio Commons, MD  isosorbide dinitrate (ISORDIL) 30 MG tablet Take 1 tablet (30 mg total) by mouth 2 (two) times daily. 10/01/14  Yes Herminio Commons, MD  labetalol (NORMODYNE) 300 MG tablet Take 1 tablet (300 mg total) by mouth 2 (two) times daily. 03/20/14  Yes Rexene Alberts, MD  lisinopril (PRINIVIL,ZESTRIL) 40 MG tablet Take 1 tablet (40 mg total) by mouth daily. 10/01/14  Yes Herminio Commons, MD  rOPINIRole (REQUIP) 3 MG tablet  Take 1 tablet (3 mg total) by mouth at bedtime. 03/20/14  Yes Rexene Alberts, MD  simvastatin (ZOCOR) 20 MG tablet Take 1 tablet (20 mg total) by mouth daily. 04/10/14  Yes Herminio Commons, MD  triamterene-hydrochlorothiazide (DYAZIDE) 37.5-25 MG per capsule Take 1 each (1 capsule total) by mouth every morning. 03/20/14  Yes Rexene Alberts, MD  zolpidem (AMBIEN) 10 MG tablet Take 1 tablet (10 mg total) by mouth at bedtime. 03/20/14  Yes Rexene Alberts, MD  cloNIDine (CATAPRES - DOSED IN MG/24 HR) 0.3 mg/24hr patch Place 1 patch (0.3 mg total) onto the skin once a week. Sundays Patient not taking: Reported on 12/24/2014 12/06/14   Herminio Commons, MD    Physical Exam: Filed Vitals:   12/25/14 3762 12/25/14 0630 12/25/14 0645 12/25/14 0716  BP: 149/70 119/93 147/73 144/91  Pulse:  89 87 73  Temp:      TempSrc:      Resp:  22 18   SpO2:  94% 93%     General: Alert, Awake and Oriented to Time, Place and Person. Appear in mild distress Eyes: PERRL ENT: Oral Mucosa clear moist. Neck: no JVD Cardiovascular: S1 and S2 Present, no Murmur, Peripheral Pulses Present Respiratory: Bilateral Air entry equal and Decreased,  Clear to Auscultation, no Crackles, no wheezes Abdomen: Bowel Sound present, Soft and non tender Skin: no Rash Extremities: no Pedal edema, no calf tenderness Neurologic: Grossly no focal neuro deficit.  Labs on Admission:  CBC:  Recent Labs Lab 12/18/14 2328 12/19/14 0230 12/24/14 1942  WBC 8.6 7.7 10.4  NEUTROABS 3.7  --   --   HGB 13.0 13.7 13.1  HCT 38.8 42.0 39.6  MCV 89.0 89.4 88.8  PLT 247 259 276    CMP     Component Value Date/Time   NA 138 12/24/2014 1942   K 3.9 12/24/2014 1942   CL 106 12/24/2014 1942   CO2 24 12/24/2014 1942   GLUCOSE 106* 12/24/2014 1942   BUN 17 12/24/2014 1942   CREATININE 0.79 12/24/2014 1942   CALCIUM 9.2 12/24/2014 1942   PROT 7.5 12/19/2014 0225   ALBUMIN 4.1 12/19/2014 0225   AST 18 12/19/2014 0225   ALT 19  12/19/2014 0225   ALKPHOS 84 12/19/2014 0225   BILITOT 0.3 12/19/2014 0225   GFRNONAA >60 12/24/2014 1942   GFRAA >60 12/24/2014 1942    No results for input(s): LIPASE, AMYLASE in the last 168 hours.   Recent Labs Lab 12/18/14 2328 12/19/14 0225  TROPONINI <0.03 <0.03   BNP (last 3 results) No results for input(s): BNP in the last 8760 hours.  ProBNP (last 3 results) No results for input(s): PROBNP in the last 8760 hours.   Radiological Exams on Admission: Dg Chest 2 View  12/24/2014   CLINICAL DATA:  54 year old female with chest pain and high blood pressure.  EXAM: CHEST  2 VIEW  COMPARISON:  Radiograph dated 12/18/2014  FINDINGS: The heart size and  mediastinal contours are within normal limits. Both lungs are clear. The visualized skeletal structures are unremarkable.  IMPRESSION: No active cardiopulmonary disease.   Electronically Signed   By: Anner Crete M.D.   On: 12/24/2014 20:03   Ct Head Wo Contrast  12/24/2014   CLINICAL DATA:  Initial evaluation for a facial droop, elevated blood pressure.  EXAM: CT HEAD WITHOUT CONTRAST  TECHNIQUE: Contiguous axial images were obtained from the base of the skull through the vertex without intravenous contrast.  COMPARISON:  Prior study from 07/18/2014  FINDINGS: There is no acute intracranial hemorrhage or infarct. No mass lesion or midline shift. Gray-white matter differentiation is well maintained. Ventricles are normal in size without evidence of hydrocephalus. CSF containing spaces are within normal limits. No extra-axial fluid collection. Patchy hypodensity within the periventricular and deep white matter most consistent with chronic small vessel ischemic disease.  The calvarium is intact.  Orbital soft tissues are within normal limits.  Scattered opacity present within the left ethmoidal air cells. Paranasal sinuses are otherwise clear. No mastoid effusion.  Scalp soft tissues are unremarkable.  IMPRESSION: 1. No acute intracranial  process. 2. Mild chronic small vessel ischemic disease.   Electronically Signed   By: Jeannine Boga M.D.   On: 12/24/2014 23:59   Mr Brain Wo Contrast  12/25/2014   CLINICAL DATA:  Initial evaluation for acute facial droop.  EXAM: MRI HEAD WITHOUT CONTRAST  TECHNIQUE: Multiplanar, multiecho pulse sequences of the brain and surrounding structures were obtained without intravenous contrast.  COMPARISON:  Prior CT from 12/24/2014  FINDINGS: Study is limited due to extensive motion artifact and patient's inability to tolerate the full length of the exam.  No abnormal foci of restricted diffusion to suggest acute intracranial infarct. Gray-white differentiation grossly maintained.  No definite mass lesion or mass effect. No midline shift. Ventricles within normal limits for size without evidence of hydrocephalus. No definite extra-axial fluid collection.  Craniocervical junction widely patent.  IMPRESSION: Limited study demonstrating no acute intracranial infarct.   Electronically Signed   By: Jeannine Boga M.D.   On: 12/25/2014 04:55   EKG: Independently reviewed. normal sinus rhythm, nonspecific ST and T waves changes.  Assessment/Plan Principal Problem:   Accelerated hypertension Active Problems:   GERD (gastroesophageal reflux disease)   Diastolic dysfunction   Type 2 diabetes, uncontrolled, with neuropathy   Morbid obesity   Paresthesias   TIA (transient ischemic attack)   1. Accelerated hypertension The patient is presenting with  2 significantly elevated blood pressure. Most likely causes noncompliance with medication regimen. She mentions she was taken off of multiple blood pressure medication although I cannot find any documentation related to that. Currently I would continue her home medication. Holding off on Resuming clonidine.  3.Possible TIA. Neurology has evaluated the patient and recommend further stroke workup. We get echocardiogram and carotid Doppler. PTOT  and speech evaluation.  4.Diabetes mellitus type 2. Check hemoglobin A1c. Patient is not on any diabetes medication.  5.Anesthesia restless leg syndrome disorder Continue home medication.   Advance goals of care discussion: full code   DVT Prophylaxis: subcutaneous Heparin Nutrition: Nothing by mouth pending stroke evaluation  Disposition: Admitted as observation, telemetry unit.  Author: Berle Mull, MD [facility] Pager: [phone] 12/25/2014  If 7PM-7AM, please contact night-coverage [email or web address] Password [email or web address]

## 2014-12-25 NOTE — ED Notes (Signed)
Attempted report to the floor, RN unable to take report at this time.

## 2014-12-25 NOTE — Progress Notes (Addendum)
*  PRELIMINARY RESULTS* Vascular Ultrasound Carotid Duplex (Doppler) has been completed.  Findings suggest 1-39% internal carotid artery stenosis bilaterally. Vertebral arteries are patent with antegrade flow.  Patient encounter: When patient arrived to the room she was sniffling and wiping her eyes. I asked if she was okay, and she did not answer. I asked her to lay in the bed and explained the test, to which she said "no". I asked if I could perform the test, she said yes and proceeded to lay in the bed as requested. I asked her again if she was okay, she said "yup, doing fine" and did not appear to be crying anymore.  Patient encounter discussed with Gabriel Cirri, RN.  12/25/2014 10:04 AM Maudry Mayhew, RVT, RDCS, RDMS

## 2014-12-25 NOTE — Consult Note (Signed)
Stroke Consult    Chief Complaint: left sided weakness and sensory deficits  HPI: Katrina [name] is an 54 y.o. female hx of HTN, HLD, DM presenting with elevated blood pressure and chest pain. While in the ED notes left sided weakness and sensory deficits. Unclear onset, reports it has been "off and on" for the past week. Has had TIAs in the past, is followed by Dr Merlene Laughter for this. Does not take a daily antiplatelet.   CT head imaging reviewed, overall unremarkable.   Date last known well: 7/13 Time last known well: unclear tPA Given: no, outside tPA window Modified Rankin: Rankin Score=0  Past Medical History  Diagnosis Date  . Type 2 diabetes mellitus   . Essential hypertension   . Hyperlipidemia   . COPD (chronic obstructive pulmonary disease)   . GERD (gastroesophageal reflux disease)   . CAD (coronary artery disease)     Moderate nonobstructive 2012-2013, [place]  . Hypothyroid   . Lung nodules   . Diverticulosis   . Bipolar affective   . Left-sided weakness 03/20/2014    MRI brain negative for stroke.    Past Surgical History  Procedure Laterality Date  . Back surgery    . Abdominal hysterectomy    . Appendectomy    . Tonsillectomy    . Ankle surgery    . Cholecystectomy    . Nose surgery      Family History  Problem Relation Age of Onset  . Coronary artery disease Father 69  . Emphysema Father    Social History:  reports that she has been smoking Cigarettes.  She has a 80 pack-year smoking history. She has never used smokeless tobacco. She reports that she does not drink alcohol or use illicit drugs.  Allergies:  Allergies  Allergen Reactions  . Penicillins Anaphylaxis  . Pineapple Rash  . Strawberry Rash  . Poultry Meal     Allergic to [place]  . Aspartame And Phenylalanine Palpitations     (Not in a hospital admission)  ROS: Out of a complete 14 system review, the patient complains of only the following symptoms, and all other reviewed  systems are negative. +sensory deficits, weakness  Physical Examination: Filed Vitals:   12/24/14 2300  BP: 192/92  Pulse: 70  Temp:   Resp: 28   Physical Exam  Constitutional: He appears well-developed and well-nourished.  Psych: Affect appropriate to situation Eyes: No scleral injection HENT: No OP obstrucion Head: Normocephalic.  Cardiovascular: Normal rate and regular rhythm.  Respiratory: Effort normal and breath sounds normal.  GI: Soft. Bowel sounds are normal. No distension. There is no tenderness.  Skin: WDI   Neurologic Examination: Mental Status: Alert, oriented, thought content appropriate.  Speech fluent without evidence of aphasia. No dysarthria.  Able to follow 3 step commands without difficulty. Cranial Nerves: II: optic discs not visualized, visual fields grossly normal, pupils equal, round, reactive to light and accommodation III,IV, VI: left ptosis, extra-ocular motions intact bilaterally V,VII: left facial weakness, facial light touch sensation normal bilaterally VIII: hearing normal bilaterally IX,X: gag reflex present XI: trapezius strength/neck flexion strength normal bilaterally XII: tongue strength normal  Motor: RUE and RLE 5/5 strength Mild LUE proximal and distal weakness (though question full effort Minimal proximal LLE weakness Tone and bulk:normal tone throughout; no atrophy noted Sensory: decreased LT and PP LUE and LLE Deep Tendon Reflexes: 2+ and symmetric throughout Plantars: Right: downgoing   Left: downgoing Cerebellar: normal finger-to-nose, and normal heel-to-shin test Gait: deferred  Laboratory Studies:   Basic Metabolic Panel:  Recent Labs Lab 12/18/14 2328 12/19/14 0225 12/24/14 1942  NA 144 141 138  K 3.6 4.1 3.9  CL 110 109 106  CO2 25 24 24   GLUCOSE 88 106* 106*  BUN 22* 22* 17  CREATININE 0.99 0.93 0.79  CALCIUM 8.6* 8.9 9.2    Liver Function Tests:  Recent Labs Lab 12/19/14 0225  AST 18  ALT 19   ALKPHOS 84  BILITOT 0.3  PROT 7.5  ALBUMIN 4.1   No results for input(s): LIPASE, AMYLASE in the last 168 hours. No results for input(s): AMMONIA in the last 168 hours.  CBC:  Recent Labs Lab 12/18/14 2328 12/19/14 0230 12/24/14 1942  WBC 8.6 7.7 10.4  NEUTROABS 3.7  --   --   HGB 13.0 13.7 13.1  HCT 38.8 42.0 39.6  MCV 89.0 89.4 88.8  PLT 247 259 276    Cardiac Enzymes:  Recent Labs Lab 12/18/14 2328 12/19/14 0225  TROPONINI <0.03 <0.03    BNP: Invalid input(s): POCBNP  CBG: No results for input(s): GLUCAP in the last 168 hours.  Microbiology: Results for orders placed or performed during the hospital encounter of 03/18/14  MRSA PCR Screening     Status: None   Collection Time: 03/19/14  1:30 AM  Result Value Ref Range Status   MRSA by PCR NEGATIVE NEGATIVE Final    Comment:        The GeneXpert MRSA Assay (FDA approved for NASAL specimens only), is one component of a comprehensive MRSA colonization surveillance program. It is not intended to diagnose MRSA infection nor to guide or monitor treatment for MRSA infections.    Coagulation Studies: No results for input(s): LABPROT, INR in the last 72 hours.  Urinalysis: No results for input(s): COLORURINE, LABSPEC, PHURINE, GLUCOSEU, HGBUR, BILIRUBINUR, KETONESUR, PROTEINUR, UROBILINOGEN, NITRITE, LEUKOCYTESUR in the last 168 hours.  Invalid input(s): APPERANCEUR  Lipid Panel:     Component Value Date/Time   CHOL 133 03/19/2014 0449   TRIG 131 03/19/2014 0449   HDL 30* 03/19/2014 0449   CHOLHDL 4.4 03/19/2014 0449   VLDL 26 03/19/2014 0449   LDLCALC 77 03/19/2014 0449    HgbA1C:  Lab Results  Component Value Date   HGBA1C 6.1* 12/19/2014    Urine Drug Screen:     Component Value Date/Time   LABOPIA NONE DETECTED 07/06/2014 2105   COCAINSCRNUR NONE DETECTED 07/06/2014 2105   LABBENZ NONE DETECTED 07/06/2014 2105   AMPHETMU NONE DETECTED 07/06/2014 2105   THCU NONE DETECTED 07/06/2014  2105   LABBARB NONE DETECTED 07/06/2014 2105    Alcohol Level: No results for input(s): ETH in the last 168 hours.  Other results:  Imaging: Dg Chest 2 View  12/24/2014   CLINICAL DATA:  54 year old female with chest pain and high blood pressure.  EXAM: CHEST  2 VIEW  COMPARISON:  Radiograph dated 12/18/2014  FINDINGS: The heart size and mediastinal contours are within normal limits. Both lungs are clear. The visualized skeletal structures are unremarkable.  IMPRESSION: No active cardiopulmonary disease.   Electronically Signed   By: Anner Crete M.D.   On: 12/24/2014 20:03   Ct Head Wo Contrast  12/24/2014   CLINICAL DATA:  Initial evaluation for a facial droop, elevated blood pressure.  EXAM: CT HEAD WITHOUT CONTRAST  TECHNIQUE: Contiguous axial images were obtained from the base of the skull through the vertex without intravenous contrast.  COMPARISON:  Prior study from 07/18/2014  FINDINGS: There  is no acute intracranial hemorrhage or infarct. No mass lesion or midline shift. Gray-white matter differentiation is well maintained. Ventricles are normal in size without evidence of hydrocephalus. CSF containing spaces are within normal limits. No extra-axial fluid collection. Patchy hypodensity within the periventricular and deep white matter most consistent with chronic small vessel ischemic disease.  The calvarium is intact.  Orbital soft tissues are within normal limits.  Scattered opacity present within the left ethmoidal air cells. Paranasal sinuses are otherwise clear. No mastoid effusion.  Scalp soft tissues are unremarkable.  IMPRESSION: 1. No acute intracranial process. 2. Mild chronic small vessel ischemic disease.   Electronically Signed   By: Jeannine Boga M.D.   On: 12/24/2014 23:59    Assessment: 54 y.o. female hx of HTN, HLD, DM presenting with left sided sensory and mild motor deficits. CT head imaging reviewed and no acute process. With multiple risk factors cannot rule  potential CVA/TIA, likely small vessel disease.   Plan: 1. HgbA1c, fasting lipid panel 2. MRI, MRA  of the brain without contrast 3. PT consult, OT consult, Speech consult 4. Echocardiogram 5. Carotid dopplers 6. Prophylactic therapy-ASA 325mg  daily 7. Risk factor modification 8. Telemetry monitoring 9. Frequent neuro checks 10. NPO until RN stroke swallow screen     Jim Like, DO [facility] [phone]  If 7pm- 7am, please page neurology on call as listed in [place]. 12/25/2014, 3:25 AM

## 2014-12-27 ENCOUNTER — Other Ambulatory Visit: Payer: Self-pay | Admitting: Internal Medicine

## 2014-12-27 DIAGNOSIS — I499 Cardiac arrhythmia, unspecified: Secondary | ICD-10-CM | POA: Diagnosis not present

## 2014-12-27 DIAGNOSIS — R7309 Other abnormal glucose: Secondary | ICD-10-CM | POA: Diagnosis not present

## 2014-12-27 DIAGNOSIS — Z1231 Encounter for screening mammogram for malignant neoplasm of breast: Secondary | ICD-10-CM

## 2014-12-27 DIAGNOSIS — I2510 Atherosclerotic heart disease of native coronary artery without angina pectoris: Secondary | ICD-10-CM | POA: Diagnosis not present

## 2014-12-27 DIAGNOSIS — I10 Essential (primary) hypertension: Secondary | ICD-10-CM | POA: Diagnosis not present

## 2015-01-15 NOTE — Discharge Summary (Addendum)
Physician Discharge Summary  Katrina [name] ZOX:[id number] DOB: 07-10-1960 DOA: 12/24/2014  PCP: Rosita Fire, MD  Admit date: 12/24/2014 Discharge date: 12/25/2014  Time spent: 35 minutes  Recommendations for Outpatient Follow-up:  Patient left against medical advice  Discharge Diagnoses:  Principal Problem:   Accelerated hypertension Active Problems:   GERD (gastroesophageal reflux disease)   Diastolic dysfunction   Type 2 diabetes, uncontrolled, with neuropathy   Morbid obesity   Paresthesias   TIA (transient ischemic attack)   Hypertensive urgency   Other chest pain   Discharge Condition: Left AMA  Diet recommendation:   Filed Weights   12/25/14 0727  Weight: 92.806 kg (204 lb 9.6 oz)    History of present illness:  Katrina [name] is a 54 y.o. female with Past medical history of diabetes mellitus, active smoker, hypertension, COPD, GERD, coronary artery disease, morbid obesity, hypothyroidism. The patient is presenting with complaints of elevated blood pressure as well as chest pain. She was found to be having some left-sided weakness. She has history of on and off left-sided weakness in the past and has more frequent episodes last week. She is not on any daily indicated medication. She also has elevated blood pressure progressively worsening over last week. She mentions she was taken off of clonidine, hydralazine, isosorbide mononitrate although I cannot find any documentation related to that.  The patient is coming from home.  At her baseline ambulates without any support And is independent for most of her ADL manages her medication on her own.  Hospital Course:  Patient is a 54 year old female with a past medical history of hypertension, history of CVA and recurrent TIAs who was admitted to the medicine service on 12/25/2014, presented with complaints of left-sided weakness, found to have elevated blood pressures. It was suspected that medication nonadherence  contributed to hypertension. Initial workup included a CT scan of brain without contrast that did not show acute intracranial process. This further worked up with an MRI of brain that was negative for acute infarct. Patient was seen and evaluated by neurology. From a cardiac standpoint she reported having increasing frequency of chest pain, and reported having chest pain at the time of my evaluation. Cardiology was consulted. She follows Dr Bronson Ing of cardiology in the outpatient setting. We were working on titrating her blood pressure medications, unfortunately on the evening of 12/25/2014 she left against medical advise.I was not present at the time she signed out AMA.    Consultations:  Cardiology  Discharge Exam: Filed Vitals:   12/25/14 2039  BP: 205/112  Pulse: 83  Temp: 98.1 F (36.7 C)  Resp: 18    Discharge Instructions    Discharge Medication List as of 12/25/2014 10:11 PM    CONTINUE these medications which have NOT CHANGED   Details  amLODipine (NORVASC) 10 MG tablet Take 1 tablet (10 mg total) by mouth daily., Starting 06/05/2014, Until Discontinued, Normal    aspirin-acetaminophen-caffeine (EXCEDRIN MIGRAINE) 250-250-65 MG per tablet Take 2 tablets by mouth every 6 (six) hours as needed for headache., Until Discontinued, Historical Med    carisoprodol (SOMA) 350 MG tablet Take 350 mg by mouth 4 (four) times daily. , Starting 05/27/2014, Until Discontinued, Historical Med    dexlansoprazole (DEXILANT) 60 MG capsule Take 1 capsule (60 mg total) by mouth every evening., Starting 03/20/2014, Until Discontinued, Print    EPINEPHrine (EPI-PEN) 0.3 mg/0.3 mL DEVI Inject 0.3 mg into the muscle once as needed (allergic reaction). , Until Discontinued, Historical Med  gabapentin (NEURONTIN) 600 MG tablet Take 1 tablet (600 mg total) by mouth 3 (three) times daily., Starting 03/20/2014, Until Discontinued, Print    hydrALAZINE (APRESOLINE) 25 MG tablet Take 1 tablet (25 mg  total) by mouth 3 (three) times daily., Starting 12/06/2014, Until Discontinued, Normal    isosorbide dinitrate (ISORDIL) 30 MG tablet Take 1 tablet (30 mg total) by mouth 2 (two) times daily., Starting 10/01/2014, Until Discontinued, Normal    labetalol (NORMODYNE) 300 MG tablet Take 1 tablet (300 mg total) by mouth 2 (two) times daily., Starting 03/20/2014, Until Discontinued, Print    lisinopril (PRINIVIL,ZESTRIL) 40 MG tablet Take 1 tablet (40 mg total) by mouth daily., Starting 10/01/2014, Until Discontinued, Normal    rOPINIRole (REQUIP) 3 MG tablet Take 1 tablet (3 mg total) by mouth at bedtime., Starting 03/20/2014, Until Discontinued, Print    simvastatin (ZOCOR) 20 MG tablet Take 1 tablet (20 mg total) by mouth daily., Starting 04/10/2014, Until Discontinued, Normal    triamterene-hydrochlorothiazide (DYAZIDE) 37.5-25 MG per capsule Take 1 each (1 capsule total) by mouth every morning., Starting 03/20/2014, Until Discontinued, Print    zolpidem (AMBIEN) 10 MG tablet Take 1 tablet (10 mg total) by mouth at bedtime., Starting 03/20/2014, Until Discontinued, Print    cloNIDine (CATAPRES - DOSED IN MG/24 HR) 0.3 mg/24hr patch Place 1 patch (0.3 mg total) onto the skin once a week. Sundays, Starting 12/06/2014, Until Discontinued, Normal       Allergies  Allergen Reactions  . Penicillins Anaphylaxis  . Pineapple Rash  . Strawberry Rash  . Poultry Meal     Allergic to [place]  . Aspartame And Phenylalanine Palpitations      The results of significant diagnostics from this hospitalization (including imaging, microbiology, ancillary and laboratory) are listed below for reference.    Significant Diagnostic Studies: Dg Chest 2 View  12/24/2014   CLINICAL DATA:  54 year old female with chest pain and high blood pressure.  EXAM: CHEST  2 VIEW  COMPARISON:  Radiograph dated 12/18/2014  FINDINGS: The heart size and mediastinal contours are within normal limits. Both lungs are clear. The  visualized skeletal structures are unremarkable.  IMPRESSION: No active cardiopulmonary disease.   Electronically Signed   By: Anner Crete M.D.   On: 12/24/2014 20:03   Dg Chest 2 View  12/19/2014   CLINICAL DATA:  54 year old female with chest pain  EXAM: CHEST  2 VIEW  COMPARISON:  Chest radiograph dated 10/10/2014  FINDINGS: The heart size and mediastinal contours are within normal limits. Both lungs are clear. The visualized skeletal structures are unremarkable.  IMPRESSION: No active cardiopulmonary disease.   Electronically Signed   By: Anner Crete M.D.   On: 12/19/2014 00:41   Ct Head Wo Contrast  12/24/2014   CLINICAL DATA:  Initial evaluation for a facial droop, elevated blood pressure.  EXAM: CT HEAD WITHOUT CONTRAST  TECHNIQUE: Contiguous axial images were obtained from the base of the skull through the vertex without intravenous contrast.  COMPARISON:  Prior study from 07/18/2014  FINDINGS: There is no acute intracranial hemorrhage or infarct. No mass lesion or midline shift. Gray-white matter differentiation is well maintained. Ventricles are normal in size without evidence of hydrocephalus. CSF containing spaces are within normal limits. No extra-axial fluid collection. Patchy hypodensity within the periventricular and deep white matter most consistent with chronic small vessel ischemic disease.  The calvarium is intact.  Orbital soft tissues are within normal limits.  Scattered opacity present within the left ethmoidal air  cells. Paranasal sinuses are otherwise clear. No mastoid effusion.  Scalp soft tissues are unremarkable.  IMPRESSION: 1. No acute intracranial process. 2. Mild chronic small vessel ischemic disease.   Electronically Signed   By: Jeannine Boga M.D.   On: 12/24/2014 23:59   Mr Brain Wo Contrast  12/25/2014   CLINICAL DATA:  Initial evaluation for acute facial droop.  EXAM: MRI HEAD WITHOUT CONTRAST  TECHNIQUE: Multiplanar, multiecho pulse sequences of the  brain and surrounding structures were obtained without intravenous contrast.  COMPARISON:  Prior CT from 12/24/2014  FINDINGS: Study is limited due to extensive motion artifact and patient's inability to tolerate the full length of the exam.  No abnormal foci of restricted diffusion to suggest acute intracranial infarct. Gray-white differentiation grossly maintained.  No definite mass lesion or mass effect. No midline shift. Ventricles within normal limits for size without evidence of hydrocephalus. No definite extra-axial fluid collection.  Craniocervical junction widely patent.  IMPRESSION: Limited study demonstrating no acute intracranial infarct.   Electronically Signed   By: Jeannine Boga M.D.   On: 12/25/2014 04:55    Microbiology: No results found for this or any previous visit (from the past 240 hour(s)).   Labs: Basic Metabolic Panel: No results for input(s): NA, K, CL, CO2, GLUCOSE, BUN, CREATININE, CALCIUM, MG, PHOS in the last 168 hours. Liver Function Tests: No results for input(s): AST, ALT, ALKPHOS, BILITOT, PROT, ALBUMIN in the last 168 hours. No results for input(s): LIPASE, AMYLASE in the last 168 hours. No results for input(s): AMMONIA in the last 168 hours. CBC: No results for input(s): WBC, NEUTROABS, HGB, HCT, MCV, PLT in the last 168 hours. Cardiac Enzymes: No results for input(s): CKTOTAL, CKMB, CKMBINDEX, TROPONINI in the last 168 hours. BNP: BNP (last 3 results) No results for input(s): BNP in the last 8760 hours.  ProBNP (last 3 results) No results for input(s): PROBNP in the last 8760 hours.  CBG: No results for input(s): GLUCAP in the last 168 hours.     SignedKelvin Cellar  [facility] 01/15/2015, 3:08 PM

## 2015-01-23 ENCOUNTER — Emergency Department (HOSPITAL_COMMUNITY): Payer: Medicare Other

## 2015-01-23 ENCOUNTER — Observation Stay (HOSPITAL_COMMUNITY)
Admission: EM | Admit: 2015-01-23 | Discharge: 2015-01-24 | Disposition: A | Discharge status: Home / Self Care | Payer: Medicare Other | Attending: Internal Medicine | Admitting: Internal Medicine

## 2015-01-23 ENCOUNTER — Encounter (HOSPITAL_COMMUNITY): Payer: Self-pay | Admitting: Emergency Medicine

## 2015-01-23 DIAGNOSIS — E785 Hyperlipidemia, unspecified: Secondary | ICD-10-CM | POA: Insufficient documentation

## 2015-01-23 DIAGNOSIS — E119 Type 2 diabetes mellitus without complications: Secondary | ICD-10-CM | POA: Diagnosis not present

## 2015-01-23 DIAGNOSIS — I2510 Atherosclerotic heart disease of native coronary artery without angina pectoris: Secondary | ICD-10-CM | POA: Diagnosis not present

## 2015-01-23 DIAGNOSIS — Z720 Tobacco use: Secondary | ICD-10-CM | POA: Diagnosis present

## 2015-01-23 DIAGNOSIS — R29810 Facial weakness: Secondary | ICD-10-CM | POA: Diagnosis not present

## 2015-01-23 DIAGNOSIS — G43909 Migraine, unspecified, not intractable, without status migrainosus: Secondary | ICD-10-CM

## 2015-01-23 DIAGNOSIS — R079 Chest pain, unspecified: Secondary | ICD-10-CM | POA: Diagnosis not present

## 2015-01-23 DIAGNOSIS — K219 Gastro-esophageal reflux disease without esophagitis: Secondary | ICD-10-CM | POA: Diagnosis not present

## 2015-01-23 DIAGNOSIS — I6789 Other cerebrovascular disease: Secondary | ICD-10-CM | POA: Diagnosis not present

## 2015-01-23 DIAGNOSIS — Z79899 Other long term (current) drug therapy: Secondary | ICD-10-CM | POA: Insufficient documentation

## 2015-01-23 DIAGNOSIS — E1165 Type 2 diabetes mellitus with hyperglycemia: Secondary | ICD-10-CM

## 2015-01-23 DIAGNOSIS — J441 Chronic obstructive pulmonary disease with (acute) exacerbation: Secondary | ICD-10-CM | POA: Insufficient documentation

## 2015-01-23 DIAGNOSIS — IMO0002 Reserved for concepts with insufficient information to code with codable children: Secondary | ICD-10-CM | POA: Diagnosis present

## 2015-01-23 DIAGNOSIS — R531 Weakness: Secondary | ICD-10-CM

## 2015-01-23 DIAGNOSIS — Z880 Allergy status to penicillin: Secondary | ICD-10-CM | POA: Diagnosis not present

## 2015-01-23 DIAGNOSIS — R51 Headache: Secondary | ICD-10-CM | POA: Diagnosis present

## 2015-01-23 DIAGNOSIS — I160 Hypertensive urgency: Secondary | ICD-10-CM | POA: Diagnosis present

## 2015-01-23 DIAGNOSIS — I10 Essential (primary) hypertension: Secondary | ICD-10-CM | POA: Diagnosis not present

## 2015-01-23 DIAGNOSIS — R4781 Slurred speech: Secondary | ICD-10-CM | POA: Diagnosis not present

## 2015-01-23 DIAGNOSIS — G459 Transient cerebral ischemic attack, unspecified: Secondary | ICD-10-CM | POA: Diagnosis present

## 2015-01-23 DIAGNOSIS — K5790 Diverticulosis of intestine, part unspecified, without perforation or abscess without bleeding: Secondary | ICD-10-CM | POA: Insufficient documentation

## 2015-01-23 DIAGNOSIS — R29898 Other symptoms and signs involving the musculoskeletal system: Secondary | ICD-10-CM | POA: Diagnosis not present

## 2015-01-23 DIAGNOSIS — I679 Cerebrovascular disease, unspecified: Secondary | ICD-10-CM | POA: Diagnosis present

## 2015-01-23 DIAGNOSIS — I639 Cerebral infarction, unspecified: Secondary | ICD-10-CM

## 2015-01-23 DIAGNOSIS — F319 Bipolar disorder, unspecified: Secondary | ICD-10-CM | POA: Diagnosis not present

## 2015-01-23 DIAGNOSIS — E039 Hypothyroidism, unspecified: Secondary | ICD-10-CM | POA: Diagnosis not present

## 2015-01-23 DIAGNOSIS — E1140 Type 2 diabetes mellitus with diabetic neuropathy, unspecified: Secondary | ICD-10-CM | POA: Diagnosis present

## 2015-01-23 LAB — URINALYSIS, ROUTINE W REFLEX MICROSCOPIC
Bilirubin Urine: NEGATIVE
Glucose, UA: NEGATIVE mg/dL
Ketones, ur: NEGATIVE mg/dL
LEUKOCYTES UA: NEGATIVE
NITRITE: NEGATIVE
PH: 5.5 (ref 5.0–8.0)
PROTEIN: NEGATIVE mg/dL
Urobilinogen, UA: 0.2 mg/dL (ref 0.0–1.0)

## 2015-01-23 LAB — DIFFERENTIAL
Basophils Absolute: 0.1 10*3/uL (ref 0.0–0.1)
Basophils Relative: 1 % (ref 0–1)
Eosinophils Absolute: 0.3 10*3/uL (ref 0.0–0.7)
Eosinophils Relative: 3 % (ref 0–5)
LYMPHS PCT: 43 % (ref 12–46)
Lymphs Abs: 4.1 10*3/uL — ABNORMAL HIGH (ref 0.7–4.0)
MONO ABS: 0.6 10*3/uL (ref 0.1–1.0)
MONOS PCT: 7 % (ref 3–12)
NEUTROS ABS: 4.6 10*3/uL (ref 1.7–7.7)
Neutrophils Relative %: 46 % (ref 43–77)

## 2015-01-23 LAB — URINE MICROSCOPIC-ADD ON

## 2015-01-23 LAB — TROPONIN I

## 2015-01-23 LAB — COMPREHENSIVE METABOLIC PANEL
ALK PHOS: 72 U/L (ref 38–126)
ALT: 14 U/L (ref 14–54)
AST: 16 U/L (ref 15–41)
Albumin: 3.7 g/dL (ref 3.5–5.0)
Anion gap: 9 (ref 5–15)
BUN: 22 mg/dL — ABNORMAL HIGH (ref 6–20)
CALCIUM: 8.7 mg/dL — AB (ref 8.9–10.3)
CO2: 21 mmol/L — ABNORMAL LOW (ref 22–32)
CREATININE: 0.84 mg/dL (ref 0.44–1.00)
Chloride: 111 mmol/L (ref 101–111)
Glucose, Bld: 91 mg/dL (ref 65–99)
Potassium: 3.7 mmol/L (ref 3.5–5.1)
Sodium: 141 mmol/L (ref 135–145)
TOTAL PROTEIN: 7 g/dL (ref 6.5–8.1)
Total Bilirubin: 0.3 mg/dL (ref 0.3–1.2)

## 2015-01-23 LAB — RAPID URINE DRUG SCREEN, HOSP PERFORMED
Amphetamines: NOT DETECTED
BARBITURATES: NOT DETECTED
Benzodiazepines: NOT DETECTED
COCAINE: NOT DETECTED
OPIATES: NOT DETECTED
Tetrahydrocannabinol: NOT DETECTED

## 2015-01-23 LAB — CBC
HEMATOCRIT: 39.6 % (ref 36.0–46.0)
Hemoglobin: 13 g/dL (ref 12.0–15.0)
MCH: 29.3 pg (ref 26.0–34.0)
MCHC: 32.8 g/dL (ref 30.0–36.0)
MCV: 89.2 fL (ref 78.0–100.0)
Platelets: 242 10*3/uL (ref 150–400)
RBC: 4.44 MIL/uL (ref 3.87–5.11)
RDW: 14.4 % (ref 11.5–15.5)
WBC: 9.7 10*3/uL (ref 4.0–10.5)

## 2015-01-23 LAB — PROTIME-INR
INR: 1.17 (ref 0.00–1.49)
Prothrombin Time: 15 seconds (ref 11.6–15.2)

## 2015-01-23 LAB — APTT: aPTT: 29 seconds (ref 24–37)

## 2015-01-23 LAB — ETHANOL

## 2015-01-23 MED ORDER — ASPIRIN 325 MG PO TABS
325.0000 mg | ORAL_TABLET | Freq: Once | ORAL | Status: AC
Start: 2015-01-23 — End: 2015-01-23
  Administered 2015-01-23: 325 mg via ORAL
  Filled 2015-01-23: qty 1

## 2015-01-23 MED ORDER — CLOPIDOGREL BISULFATE 75 MG PO TABS
300.0000 mg | ORAL_TABLET | Freq: Once | ORAL | Status: AC
  Administered 2015-01-23: 300 mg via ORAL
  Filled 2015-01-23: qty 4

## 2015-01-23 NOTE — ED Provider Notes (Addendum)
CSN: [id number]     Arrival date & time 01/23/15  2221 History  This chart was scribed for Aalijah Mims Julio Alm, MD by Hansel Feinstein, ED Scribe. This patient was seen in room APA09/APA09 and the patient's care was started at 10:32 PM.     Chief Complaint  Patient presents with  . Code Stroke   The history is provided by the patient. No language interpreter was used.    HPI Comments: Katrina [name] is a 54 y.o. female with Hx of TIA (03/2014)  type II DM, HTN, HLD, COPD, GERD, CAD who presents to the Emergency Department complaining of moderate stroke-like symptoms with associated left-sided facial droop, left-sided weakness, left-sided numbness to the cheek, arm and leg. She also notes associated HA for 5 days, CP, left shoulder and arm pain. Per nursing staff, EMS was called by the daughter. Pt states she was walking in [place] when her current symptoms began. Pt is a current smoker. She is seen by a neurologist since her stroke on 03/2014. She states she still has some weakness from this stroke. She denies ASA, Plavix, anticoagulant use. She also denies abdominal pain.   Past Medical History  Diagnosis Date  . Type 2 diabetes mellitus   . Essential hypertension   . Hyperlipidemia   . COPD (chronic obstructive pulmonary disease)   . GERD (gastroesophageal reflux disease)   . CAD (coronary artery disease)     Moderate nonobstructive 2012-2013, [place]  . Hypothyroid   . Lung nodules   . Diverticulosis   . Bipolar affective   . Left-sided weakness 03/20/2014    MRI brain negative for stroke.   Past Surgical History  Procedure Laterality Date  . Back surgery    . Abdominal hysterectomy    . Appendectomy    . Tonsillectomy    . Ankle surgery    . Cholecystectomy    . Nose surgery     Family History  Problem Relation Age of Onset  . Coronary artery disease Father 49  . Emphysema Father    Social History  Substance Use Topics  . Smoking status: Current Every Day Smoker -- 2.00  packs/day for 40 years    Types: Cigarettes  . Smokeless tobacco: Never Used  . Alcohol Use: No   OB History    No data available     Review of Systems  Cardiovascular: Positive for chest pain.  Gastrointestinal: Negative for abdominal pain.  Musculoskeletal: Positive for arthralgias.  Neurological: Positive for weakness, numbness and headaches.       Left sided facial droop  All other systems reviewed and are negative.  Allergies  Penicillins; Pineapple; Strawberry; Poultry meal; and Aspartame and phenylalanine  Home Medications   Prior to Admission medications   Medication Sig Start Date End Date Taking? Authorizing Provider  amLODipine (NORVASC) 10 MG tablet Take 1 tablet (10 mg total) by mouth daily. 06/05/14   Herminio Commons, MD  aspirin-acetaminophen-caffeine (EXCEDRIN MIGRAINE) [phone] MG per tablet Take 2 tablets by mouth every 6 (six) hours as needed for headache.    Historical Provider, MD  carisoprodol (SOMA) 350 MG tablet Take 350 mg by mouth 4 (four) times daily.  05/27/14   Historical Provider, MD  cloNIDine (CATAPRES - DOSED IN MG/24 HR) 0.3 mg/24hr patch Place 1 patch (0.3 mg total) onto the skin once a week. Sundays Patient not taking: Reported on 12/24/2014 12/06/14   Herminio Commons, MD  dexlansoprazole (DEXILANT) 60 MG capsule Take 1 capsule (  60 mg total) by mouth every evening. 03/20/14   Rexene Alberts, MD  EPINEPHrine (EPI-PEN) 0.3 mg/0.3 mL DEVI Inject 0.3 mg into the muscle once as needed (allergic reaction).     Historical Provider, MD  gabapentin (NEURONTIN) 600 MG tablet Take 1 tablet (600 mg total) by mouth 3 (three) times daily. 03/20/14   Rexene Alberts, MD  hydrALAZINE (APRESOLINE) 25 MG tablet Take 1 tablet (25 mg total) by mouth 3 (three) times daily. 12/06/14   Herminio Commons, MD  isosorbide dinitrate (ISORDIL) 30 MG tablet Take 1 tablet (30 mg total) by mouth 2 (two) times daily. 10/01/14   Herminio Commons, MD  labetalol (NORMODYNE)  300 MG tablet Take 1 tablet (300 mg total) by mouth 2 (two) times daily. 03/20/14   Rexene Alberts, MD  lisinopril (PRINIVIL,ZESTRIL) 40 MG tablet Take 1 tablet (40 mg total) by mouth daily. 10/01/14   Herminio Commons, MD  rOPINIRole (REQUIP) 3 MG tablet Take 1 tablet (3 mg total) by mouth at bedtime. 03/20/14   Rexene Alberts, MD  simvastatin (ZOCOR) 20 MG tablet Take 1 tablet (20 mg total) by mouth daily. 04/10/14   Herminio Commons, MD  triamterene-hydrochlorothiazide (DYAZIDE) 37.5-25 MG per capsule Take 1 each (1 capsule total) by mouth every morning. 03/20/14   Rexene Alberts, MD  zolpidem (AMBIEN) 10 MG tablet Take 1 tablet (10 mg total) by mouth at bedtime. 03/20/14   Rexene Alberts, MD   BP 188/96 mmHg  Pulse 82  Temp(Src) 97.9 F (36.6 C) (Oral)  Resp 14  Ht 5\' 7"  (1.702 m)  Wt 210 lb (95.255 kg)  BMI 32.88 kg/m2  SpO2 96% Physical Exam  Constitutional: She is oriented to person, place, and time. She appears well-developed and well-nourished.  Pt exhibits slowed speech and slowed cognition.   HENT:  Head: Normocephalic and atraumatic.  Eyes: Conjunctivae and EOM are normal. Pupils are equal, round, and reactive to light.  Neck: Normal range of motion. Neck supple.  Cardiovascular: Normal rate.   Pulmonary/Chest: Effort normal. No respiratory distress. She has wheezes.  Abdominal: She exhibits no distension.  Musculoskeletal: Normal range of motion.  Neurological: She is alert and oriented to person, place, and time.  No pronator drift. Left facial droop. Equal strength of BUE and BLE. Equal SLR.   Skin: Skin is warm and dry.  Psychiatric: She has a normal mood and affect. Her behavior is normal.  Nursing note and vitals reviewed.  ED Course  Procedures (including critical care time) DIAGNOSTIC STUDIES: Oxygen Saturation is 96% on RA, adequate by my interpretation.    COORDINATION OF CARE: 10:40 PM Discussed treatment plan with pt at bedside and pt agreed to plan.    Labs Review Labs Reviewed  DIFFERENTIAL - Abnormal; Notable for the following:    Lymphs Abs 4.1 (*)    All other components within normal limits  COMPREHENSIVE METABOLIC PANEL - Abnormal; Notable for the following:    CO2 21 (*)    BUN 22 (*)    Calcium 8.7 (*)    All other components within normal limits  ETHANOL  PROTIME-INR  APTT  CBC  URINE RAPID DRUG SCREEN, HOSP PERFORMED  URINALYSIS, ROUTINE W REFLEX MICROSCOPIC (NOT AT [facility])  TROPONIN I  I-STAT CHEM 8, ED  I-STAT TROPOININ, ED    Imaging Review Ct Head Wo Contrast  01/23/2015   CLINICAL DATA:  Acute onset of left-sided weakness and left facial droop. Initial encounter.  EXAM: CT HEAD WITHOUT  CONTRAST  TECHNIQUE: Contiguous axial images were obtained from the base of the skull through the vertex without intravenous contrast.  COMPARISON:  MRI of the brain performed 12/25/2014, and CT of the head performed 12/24/2014  FINDINGS: There is no evidence of acute infarction, mass lesion, or intra- or extra-axial hemorrhage on CT.  Mild periventricular white matter change likely reflects small vessel ischemic microangiopathy.  The posterior fossa, including the cerebellum, brainstem and fourth ventricle, is within normal limits. The third and lateral ventricles, and basal ganglia are unremarkable in appearance. The cerebral hemispheres are symmetric in appearance, with normal gray-white differentiation. No mass effect or midline shift is seen.  There is no evidence of fracture; visualized osseous structures are unremarkable in appearance. The orbits are within normal limits. The paranasal sinuses and mastoid air cells are well-aerated. No significant soft tissue abnormalities are seen.  IMPRESSION: 1. No acute intracranial pathology seen on CT. 2. Mild small vessel ischemic microangiopathy. These results were called by telephone at the time of interpretation on 01/23/2015 at 11:00 pm to Dr. Zenovia Jarred, who verbally acknowledged these  results.   Electronically Signed   By: Garald Balding M.D.   On: 01/23/2015 23:05   I have personally reviewed and evaluated these images and lab results as part of my medical decision-making.   EKG Interpretation   Date/Time:  Thursday January 23 2015 22:28:01 EDT Ventricular Rate:  80 PR Interval:  175 QRS Duration: 92 QT Interval:  399 QTC Calculation: 460 R Axis:   46 Text Interpretation:  Sinus rhythm No significant change since last  tracing Confirmed by Gerald Leitz ([id number]) on 01/23/2015 11:17:02 PM      MDM   Final diagnoses:  Weakness  Stroke   patient is a 54 year old female presenting today with stroke like symptoms. Patient was at [facility] when she developed left facial weakness. She has weakness in her arms and leg on the left. Patient states some of this is residual from her last stroke. Patient states she's not on aspirin and Plavix or anticoagulants. Patient seems mildly cognitively slowed.  Patient stated she did have occasional chest pain for the last couple days. Otherwise has been in usual state of health.  CT ordered. Initially I wanted a CTA but did not want to wait for her creatinine. Neurology aware, they will emergently consult via telemedicine   Punta Santiago, MD 01/23/15 2318  I personally performed the services described in this documentation, which was scribed in my presence. The recorded information has been reviewed and is accurate.   Merve Hotard Julio Alm, MD 01/23/15 2319  12:25 AM Neurology thinks, likely psychogenic. Will get CTA to rule out large vessel, admit, they recommend several days of asa and plavix, MRI in am.     Carrieanne Kleen Julio Alm, MD 01/24/15 [phone]

## 2015-01-23 NOTE — ED Notes (Signed)
Per EMS patient's daughter called stating mother had left sided facial droop. Hx of TIA.

## 2015-01-23 NOTE — Progress Notes (Signed)
2229 Code stroke beeper went off 2230 I called Velinda, Network engineer and told her they could bring patient on to [place], RN, arrived with patient to [place] room [place] and patient leaving CT room 2254 Completed exam in Epic and called Dr Radene Knee at [facility]. 2300 Dr Radene Knee called results to Dr Thomasene [address] 2305 Report finalized in PACS and EPIC

## 2015-01-24 ENCOUNTER — Encounter (HOSPITAL_COMMUNITY): Payer: Self-pay | Admitting: Internal Medicine

## 2015-01-24 ENCOUNTER — Observation Stay (HOSPITAL_COMMUNITY): Payer: Medicare Other

## 2015-01-24 DIAGNOSIS — E1140 Type 2 diabetes mellitus with diabetic neuropathy, unspecified: Secondary | ICD-10-CM

## 2015-01-24 DIAGNOSIS — E1165 Type 2 diabetes mellitus with hyperglycemia: Secondary | ICD-10-CM

## 2015-01-24 DIAGNOSIS — G43909 Migraine, unspecified, not intractable, without status migrainosus: Secondary | ICD-10-CM | POA: Diagnosis present

## 2015-01-24 DIAGNOSIS — R29810 Facial weakness: Secondary | ICD-10-CM | POA: Diagnosis not present

## 2015-01-24 DIAGNOSIS — G43011 Migraine without aura, intractable, with status migrainosus: Secondary | ICD-10-CM

## 2015-01-24 DIAGNOSIS — Z720 Tobacco use: Secondary | ICD-10-CM

## 2015-01-24 DIAGNOSIS — G4489 Other headache syndrome: Secondary | ICD-10-CM | POA: Diagnosis not present

## 2015-01-24 DIAGNOSIS — R29898 Other symptoms and signs involving the musculoskeletal system: Secondary | ICD-10-CM | POA: Diagnosis not present

## 2015-01-24 DIAGNOSIS — G459 Transient cerebral ischemic attack, unspecified: Secondary | ICD-10-CM

## 2015-01-24 LAB — CREATININE, SERUM: Creatinine, Ser: 0.73 mg/dL (ref 0.44–1.00)

## 2015-01-24 LAB — CBC
HEMATOCRIT: 38 % (ref 36.0–46.0)
Hemoglobin: 12.3 g/dL (ref 12.0–15.0)
MCH: 29.1 pg (ref 26.0–34.0)
MCHC: 32.4 g/dL (ref 30.0–36.0)
MCV: 90 fL (ref 78.0–100.0)
PLATELETS: 227 10*3/uL (ref 150–400)
RBC: 4.22 MIL/uL (ref 3.87–5.11)
RDW: 14.6 % (ref 11.5–15.5)
WBC: 7.4 10*3/uL (ref 4.0–10.5)

## 2015-01-24 LAB — TSH: TSH: 3.228 u[IU]/mL (ref 0.350–4.500)

## 2015-01-24 MED ORDER — ZOLPIDEM TARTRATE 5 MG PO TABS: 5.0000 mg | ORAL_TABLET | Freq: Every day | ORAL | Status: DC

## 2015-01-24 MED ORDER — SODIUM CHLORIDE 0.9 % IJ SOLN
INTRAMUSCULAR | Status: AC
  Administered 2015-01-24: 07:00:00
  Filled 2015-01-24: qty 48

## 2015-01-24 MED ORDER — PANTOPRAZOLE SODIUM 40 MG PO TBEC
40.0000 mg | DELAYED_RELEASE_TABLET | Freq: Every day | ORAL | Status: DC
  Filled 2015-01-24: qty 1

## 2015-01-24 MED ORDER — ONDANSETRON HCL 4 MG PO TABS: 4.0000 mg | ORAL_TABLET | Freq: Four times a day (QID) | ORAL | Status: DC | PRN

## 2015-01-24 MED ORDER — ASPIRIN EC 325 MG PO TBEC
325.0000 mg | DELAYED_RELEASE_TABLET | Freq: Every day | ORAL | Status: DC
  Filled 2015-01-24: qty 1

## 2015-01-24 MED ORDER — TOPIRAMATE 25 MG PO TABS
25.0000 mg | ORAL_TABLET | Freq: Every day | ORAL | Status: DC
  Filled 2015-01-24: qty 1

## 2015-01-24 MED ORDER — GABAPENTIN 300 MG PO CAPS: 600.0000 mg | ORAL_CAPSULE | Freq: Three times a day (TID) | ORAL | Status: DC

## 2015-01-24 MED ORDER — ACETAMINOPHEN 325 MG PO TABS: 650.0000 mg | ORAL_TABLET | Freq: Four times a day (QID) | ORAL | Status: DC | PRN

## 2015-01-24 MED ORDER — SIMVASTATIN 20 MG PO TABS
20.0000 mg | ORAL_TABLET | Freq: Every day | ORAL | Status: DC
  Filled 2015-01-24: qty 1

## 2015-01-24 MED ORDER — IOHEXOL 350 MG/ML SOLN
75.0000 mL | Freq: Once | INTRAVENOUS | Status: AC | PRN
  Administered 2015-01-24: 75 mL via INTRAVENOUS

## 2015-01-24 MED ORDER — ISOSORBIDE DINITRATE 20 MG PO TABS
30.0000 mg | ORAL_TABLET | Freq: Two times a day (BID) | ORAL | Status: DC
  Filled 2015-01-24 (×2): qty 2

## 2015-01-24 MED ORDER — LABETALOL HCL 200 MG PO TABS
300.0000 mg | ORAL_TABLET | Freq: Two times a day (BID) | ORAL | Status: DC
  Filled 2015-01-24 (×2): qty 2

## 2015-01-24 MED ORDER — GABAPENTIN 600 MG PO TABS
600.0000 mg | ORAL_TABLET | Freq: Three times a day (TID) | ORAL | Status: DC
  Filled 2015-01-24: qty 1

## 2015-01-24 MED ORDER — LABETALOL HCL 5 MG/ML IV SOLN
10.0000 mg | INTRAVENOUS | Status: DC | PRN
  Administered 2015-01-24: 10 mg via INTRAVENOUS
  Filled 2015-01-24: qty 4

## 2015-01-24 MED ORDER — SODIUM CHLORIDE 0.9 % IJ SOLN
3.0000 mL | Freq: Two times a day (BID) | INTRAMUSCULAR | Status: DC
  Administered 2015-01-24 (×2): 3 mL via INTRAVENOUS

## 2015-01-24 MED ORDER — ENOXAPARIN SODIUM 40 MG/0.4ML ~~LOC~~ SOLN
40.0000 mg | SUBCUTANEOUS | Status: DC
  Filled 2015-01-24: qty 0.4

## 2015-01-24 MED ORDER — ACETAMINOPHEN 650 MG RE SUPP
650.0000 mg | Freq: Four times a day (QID) | RECTAL | Status: DC | PRN
Start: 2015-01-24 — End: 2015-01-24

## 2015-01-24 MED ORDER — ONDANSETRON HCL 4 MG/2ML IJ SOLN: 4.0000 mg | Freq: Four times a day (QID) | INTRAMUSCULAR | Status: DC | PRN

## 2015-01-24 MED ORDER — TRIAMTERENE-HCTZ 37.5-25 MG PO TABS: 1.0000 | ORAL_TABLET | Freq: Every day | ORAL | Status: DC

## 2015-01-24 MED ORDER — TRIAMTERENE-HCTZ 37.5-25 MG PO CAPS
1.0000 | ORAL_CAPSULE | Freq: Every morning | ORAL | Status: DC
  Filled 2015-01-24 (×2): qty 1

## 2015-01-24 MED ORDER — MORPHINE SULFATE (PF) 2 MG/ML IV SOLN
2.0000 mg | INTRAVENOUS | Status: DC | PRN
  Administered 2015-01-24: 2 mg via INTRAVENOUS
  Filled 2015-01-24: qty 1

## 2015-01-24 MED ORDER — AMLODIPINE BESYLATE 5 MG PO TABS
10.0000 mg | ORAL_TABLET | Freq: Every day | ORAL | Status: DC
Start: 2015-01-24 — End: 2015-01-24
  Filled 2015-01-24: qty 2

## 2015-01-24 MED ORDER — LISINOPRIL 10 MG PO TABS
40.0000 mg | ORAL_TABLET | Freq: Every day | ORAL | Status: DC
Start: 2015-01-24 — End: 2015-01-24
  Filled 2015-01-24: qty 4

## 2015-01-24 NOTE — Discharge Summary (Signed)
Physician Discharge Summary  Patient ID: Katrina [name] MRN: [id number] DOB/AGE: November 17, 1960 54 y.o. Primary Care Physician:FANTA,TESFAYE, MD Admit date: 01/23/2015 Discharge date: 01/24/2015    Discharge Diagnoses:  1. Left facial droop. Resolved. 2. Headaches. Resolved. 3. Migraine syndrome. 4. Diabetes. 5. Hypertension. Controlled.     Medication List    TAKE these medications        amLODipine 10 MG tablet  Commonly known as:  NORVASC  Take 1 tablet (10 mg total) by mouth daily.     aspirin-acetaminophen-caffeine 147-829-56 MG per tablet  Commonly known as:  EXCEDRIN MIGRAINE  Take 2 tablets by mouth every 6 (six) hours as needed for headache.     carisoprodol 350 MG tablet  Commonly known as:  SOMA  Take 350 mg by mouth 4 (four) times daily.     cloNIDine 0.3 mg/24hr patch  Commonly known as:  CATAPRES - Dosed in mg/24 hr  Place 1 patch (0.3 mg total) onto the skin once a week. Sundays     dexlansoprazole 60 MG capsule  Commonly known as:  DEXILANT  Take 1 capsule (60 mg total) by mouth every evening.     EPINEPHrine 0.3 mg/0.3 mL Devi  Commonly known as:  EPI-PEN  Inject 0.3 mg into the muscle once as needed (allergic reaction).     gabapentin 600 MG tablet  Commonly known as:  NEURONTIN  Take 1 tablet (600 mg total) by mouth 3 (three) times daily.     hydrALAZINE 25 MG tablet  Commonly known as:  APRESOLINE  Take 1 tablet (25 mg total) by mouth 3 (three) times daily.     isosorbide dinitrate 30 MG tablet  Commonly known as:  ISORDIL  Take 1 tablet (30 mg total) by mouth 2 (two) times daily.     labetalol 300 MG tablet  Commonly known as:  NORMODYNE  Take 1 tablet (300 mg total) by mouth 2 (two) times daily.     lisinopril 40 MG tablet  Commonly known as:  PRINIVIL,ZESTRIL  Take 1 tablet (40 mg total) by mouth daily.     rOPINIRole 3 MG tablet  Commonly known as:  REQUIP  Take 1 tablet (3 mg total) by mouth at bedtime.     simvastatin 20 MG  tablet  Commonly known as:  ZOCOR  Take 1 tablet (20 mg total) by mouth daily.     triamterene-hydrochlorothiazide 37.5-25 MG per capsule  Commonly known as:  DYAZIDE  Take 1 each (1 capsule total) by mouth every morning.     zolpidem 10 MG tablet  Commonly known as:  AMBIEN  Take 1 tablet (10 mg total) by mouth at bedtime.        Discharged Condition: Stable.    Consults: None.  Significant Diagnostic Studies: Ct Angio Head W/cm &/or Wo Cm  01/24/2015   CLINICAL DATA:  Initial evaluation for acute left-sided facial droop, left-sided weakness.  EXAM: CT ANGIOGRAPHY HEAD  TECHNIQUE: Multidetector CT imaging of the head was performed using the standard protocol during bolus administration of intravenous contrast. Multiplanar CT image reconstructions and MIPs were obtained to evaluate the vascular anatomy.  CONTRAST:  97mL OMNIPAQUE IOHEXOL 350 MG/ML SOLN  COMPARISON:  Prior noncontrast head CT performed earlier on the same day.  FINDINGS: CTA HEAD  Anterior circulation: Visualized portions of the distal cervical segments of the internal carotid arteries are widely patent bilaterally. Petrous segments widely patent bilaterally. Scattered atheromatous irregularity present within the cavernous segments bilaterally without hemodynamically  significant stenosis. Supraclinoid segments widely patent. A1 segments widely patent. Anterior communicating artery and anterior cerebral arteries well opacified bilaterally.  M1 segments patent without stenosis or occlusion. Distal MCA branches fairly symmetric bilaterally and well opacified.  Posterior circulation: Right vertebral artery is dominant and widely patent to the vertebrobasilar junction. Diminutive left vertebral artery divides at the takeoff of the left posterior inferior cerebellar artery with a smaller hypoplastic branch ascending towards the vertebrobasilar junction. Basilar artery widely patent. Superior cerebellar arteries well opacified  bilaterally. Both the posterior cerebral arteries arise from the basilar artery and are well opacified to their distal aspects.  Venous sinuses: No acute abnormality within the venous sinuses.  Anatomic variants: No anatomic variant.  No aneurysm.  Delayed phase:No abnormal enhancement on delayed sequence.  IMPRESSION: 1. Negative CTA of the head with no large or proximal arterial branch occlusion or hemodynamically significant stenosis identified. 2. Multifocal atheromatous irregularity within the cavernous segments of the internal carotid arteries bilaterally.   Electronically Signed   By: Jeannine Boga M.D.   On: 01/24/2015 01:08   Ct Head Wo Contrast  01/23/2015   CLINICAL DATA:  Acute onset of left-sided weakness and left facial droop. Initial encounter.  EXAM: CT HEAD WITHOUT CONTRAST  TECHNIQUE: Contiguous axial images were obtained from the base of the skull through the vertex without intravenous contrast.  COMPARISON:  MRI of the brain performed 12/25/2014, and CT of the head performed 12/24/2014  FINDINGS: There is no evidence of acute infarction, mass lesion, or intra- or extra-axial hemorrhage on CT.  Mild periventricular white matter change likely reflects small vessel ischemic microangiopathy.  The posterior fossa, including the cerebellum, brainstem and fourth ventricle, is within normal limits. The third and lateral ventricles, and basal ganglia are unremarkable in appearance. The cerebral hemispheres are symmetric in appearance, with normal gray-white differentiation. No mass effect or midline shift is seen.  There is no evidence of fracture; visualized osseous structures are unremarkable in appearance. The orbits are within normal limits. The paranasal sinuses and mastoid air cells are well-aerated. No significant soft tissue abnormalities are seen.  IMPRESSION: 1. No acute intracranial pathology seen on CT. 2. Mild small vessel ischemic microangiopathy. These results were called by  telephone at the time of interpretation on 01/23/2015 at 11:00 pm to Dr. Zenovia Jarred, who verbally acknowledged these results.   Electronically Signed   By: Garald Balding M.D.   On: 01/23/2015 23:05    Lab Results: Basic Metabolic Panel:  Recent Labs  01/23/15 2232 01/24/15 0749  NA 141  --   K 3.7  --   CL 111  --   CO2 21*  --   GLUCOSE 91  --   BUN 22*  --   CREATININE 0.84 0.73  CALCIUM 8.7*  --    Liver Function Tests:  Recent Labs  01/23/15 2232  AST 16  ALT 14  ALKPHOS 72  BILITOT 0.3  PROT 7.0  ALBUMIN 3.7     CBC:  Recent Labs  01/23/15 2243 01/24/15 0749  WBC 9.7 7.4  NEUTROABS 4.6  --   HGB 13.0 12.3  HCT 39.6 38.0  MCV 89.2 90.0  PLT 242 227    No results found for this or any previous visit (from the past 240 hour(s)).   Hospital Course: This is a 54 year old lady was admitted approximately 7 hours ago with history of possible left facial droop and headaches. Please see initial history as outlined below: HPI: Katrina L  [name] is an right handed 54 y.o. female with hx HTN, DM, HLD, moderate non obstructive CAD, hypothyroidism, bipolar disorder, admitted in July at [facility] for TIA/CVA, and left AMA, presented to the ER with left sided paresthesia, left facial droop, slurred speech, with ictus about 5 hours PTA. Code stroke was called and TPA was not recommended (out of the TPA window) She also has hx of migraine, and her HA has not been in good control. She sees Dr Merlene Laughter regularly. . Teleneurology recommended admission for MRI. Her head CT was negative, and her CTA of the cerebral vessel did not show any significant obstruction. She has not been taking her ASA, and she was given plavix and ASA in the ER. Hospitalist was asked to admit her for TIA/CVA. In the ER, her symptoms improved.  This morning when I saw her, all symptoms have resolved. She does not wish any further testing such as MRI brain scan which had been ordered. She said that  she cannot tolerate being in a enclosed space for an MRI. She is quite happy to follow up with her neurologist as an outpatient. Discharge Exam: Blood pressure 155/76, pulse 61, temperature 98.3 F (36.8 C), temperature source Oral, resp. rate 16, height 5\' 7"  (1.702 m), weight 93.123 kg (205 lb 4.8 oz), SpO2 99 %. She is alert and orientated. She has no focal neurological signs whatsoever. She does not have any facial asymmetry.  Disposition: Home. In view of the fact that she really does not have any focal neurological signs and CT brain scan was unremarkable and nonacute.      Discharge Instructions    Diet - low sodium heart healthy    Complete by:  As directed      Increase activity slowly    Complete by:  As directed              Signed: Barnhart C   01/24/2015, 9:19 AM

## 2015-01-24 NOTE — ED Notes (Signed)
Assumed care of patient from [place]. Pt resting quietly. No distress. VSS. Telestroke in progress at bedside. Pt with left facial droop, left sided weakness since 1830. Daughter at bedside, to confirm.

## 2015-01-24 NOTE — Progress Notes (Addendum)
Pt refused all 0345 meds.  Md notified

## 2015-01-24 NOTE — H&P (Signed)
[facility] History and Physical  Katrina [name]:[id number] DOB: Jul 27, 1960    PCP:   Rosita Fire, MD   Chief Complaint: left facial droop  HPI: Katrina [name] is an right handed 54 y.o. female with hx HTN, DM, HLD, moderate non obstructive CAD, hypothyroidism, bipolar disorder, admitted in July at [facility] for TIA/CVA, and left AMA,  presented to the ER with left sided paresthesia, left facial droop, slurred speech, with ictus about 5 hours PTA.  Code stroke was called and TPA was not recommended (out of the TPA window)  She also has hx of migraine, and her HA has not been in good control.  She sees Dr Merlene Laughter regularly.  .  Teleneurology recommended admission for MRI.  Her head CT was negative, and her CTA of the cerebral vessel did not show any significant obstruction.  She has not been taking her ASA, and she was given plavix and ASA in the ER.   Hospitalist was asked to admit her for TIA/CVA. In the ER, her symptoms improved.   Rewiew of Systems:  Constitutional: Negative for malaise, fever and chills. No significant weight loss or weight gain Eyes: Negative for eye pain, redness and discharge, diplopia, visual changes, or flashes of light. ENMT: Negative for ear pain, hoarseness, nasal congestion, sinus pressure and sore throat. No headaches; tinnitus, drooling, or problem swallowing. Cardiovascular: Negative for chest pain, palpitations, diaphoresis, dyspnea and peripheral edema. ; No orthopnea, PND Respiratory: Negative for cough, hemoptysis, wheezing and stridor. No pleuritic chestpain. Gastrointestinal: Negative for nausea, vomiting, diarrhea, constipation, abdominal pain, melena, blood in stool, hematemesis, jaundice and rectal bleeding.    Genitourinary: Negative for frequency, dysuria, incontinence,flank pain and hematuria; Musculoskeletal: Negative for back pain and neck pain. Negative for swelling and trauma.;  Skin: . Negative for pruritus, rash, abrasions, bruising and  skin lesion.; ulcerations Neuro: Negative for headache, lightheadedness and neck stiffness. Negative for weakness, altered level of consciousness , altered mental status, extremity weakness, burning feet, involuntary movement, seizure and syncope.  Psych: negative for anxiety, depression, insomnia, tearfulness, panic attacks, hallucinations, paranoia, suicidal or homicidal ideation    Past Medical History  Diagnosis Date  . Type 2 diabetes mellitus   . Essential hypertension   . Hyperlipidemia   . COPD (chronic obstructive pulmonary disease)   . GERD (gastroesophageal reflux disease)   . CAD (coronary artery disease)     Moderate nonobstructive 2012-2013, [place]  . Hypothyroid   . Lung nodules   . Diverticulosis   . Bipolar affective   . Left-sided weakness 03/20/2014    MRI brain negative for stroke.    Past Surgical History  Procedure Laterality Date  . Back surgery    . Abdominal hysterectomy    . Appendectomy    . Tonsillectomy    . Ankle surgery    . Cholecystectomy    . Nose surgery      Medications:  HOME MEDS: Prior to Admission medications   Medication Sig Start Date End Date Taking? Authorizing Provider  amLODipine (NORVASC) 10 MG tablet Take 1 tablet (10 mg total) by mouth daily. 06/05/14  Yes Herminio Commons, MD  aspirin-acetaminophen-caffeine (EXCEDRIN MIGRAINE) [id number] MG per tablet Take 2 tablets by mouth every 6 (six) hours as needed for headache.   Yes Historical Provider, MD  carisoprodol (SOMA) 350 MG tablet Take 350 mg by mouth 4 (four) times daily.  05/27/14  Yes Historical Provider, MD  dexlansoprazole (DEXILANT) 60 MG capsule Take 1 capsule (60 mg total)  by mouth every evening. 03/20/14  Yes Rexene Alberts, MD  EPINEPHrine (EPI-PEN) 0.3 mg/0.3 mL DEVI Inject 0.3 mg into the muscle once as needed (allergic reaction).    Yes Historical Provider, MD  gabapentin (NEURONTIN) 600 MG tablet Take 1 tablet (600 mg total) by mouth 3 (three) times daily.  03/20/14  Yes Rexene Alberts, MD  hydrALAZINE (APRESOLINE) 25 MG tablet Take 1 tablet (25 mg total) by mouth 3 (three) times daily. 12/06/14  Yes Herminio Commons, MD  isosorbide dinitrate (ISORDIL) 30 MG tablet Take 1 tablet (30 mg total) by mouth 2 (two) times daily. 10/01/14  Yes Herminio Commons, MD  labetalol (NORMODYNE) 300 MG tablet Take 1 tablet (300 mg total) by mouth 2 (two) times daily. 03/20/14  Yes Rexene Alberts, MD  rOPINIRole (REQUIP) 3 MG tablet Take 1 tablet (3 mg total) by mouth at bedtime. 03/20/14  Yes Rexene Alberts, MD  triamterene-hydrochlorothiazide (DYAZIDE) 37.5-25 MG per capsule Take 1 each (1 capsule total) by mouth every morning. 03/20/14  Yes Rexene Alberts, MD  zolpidem (AMBIEN) 10 MG tablet Take 1 tablet (10 mg total) by mouth at bedtime. 03/20/14  Yes Rexene Alberts, MD  cloNIDine (CATAPRES - DOSED IN MG/24 HR) 0.3 mg/24hr patch Place 1 patch (0.3 mg total) onto the skin once a week. Sundays Patient not taking: Reported on 12/24/2014 12/06/14   Herminio Commons, MD  lisinopril (PRINIVIL,ZESTRIL) 40 MG tablet Take 1 tablet (40 mg total) by mouth daily. 10/01/14   Herminio Commons, MD  simvastatin (ZOCOR) 20 MG tablet Take 1 tablet (20 mg total) by mouth daily. 04/10/14   Herminio Commons, MD     Allergies:  Allergies  Allergen Reactions  . Penicillins Anaphylaxis  . Pineapple Rash  . Strawberry Rash  . Poultry Meal     Allergic to [place]  . Aspartame And Phenylalanine Palpitations    Social History:   reports that she has been smoking Cigarettes.  She has a 80 pack-year smoking history. She has never used smokeless tobacco. She reports that she does not drink alcohol or use illicit drugs.  Family History: Family History  Problem Relation Age of Onset  . Coronary artery disease Father 70  . Emphysema Father      Physical Exam: Filed Vitals:   01/24/15 0130 01/24/15 0145 01/24/15 0200 01/24/15 0215  BP: 176/76 193/91 200/87 201/88  Pulse: 70 76  69 75  Temp:      TempSrc:      Resp: 20 27 18 23   Height:      Weight:      SpO2: 96% 98% 98% 98%   Blood pressure 201/88, pulse 75, temperature 97.9 F (36.6 C), temperature source Oral, resp. rate 23, height 5\' 7"  (1.702 m), weight 95.255 kg (210 lb), SpO2 98 %.  GEN:  Pleasant  patient lying in the stretcher in no acute distress; cooperative with exam. PSYCH:  alert and oriented x4; does not appear anxious or depressed; affect is appropriate. HEENT: Mucous membranes pink and anicteric; PERRLA; EOM intact; no cervical lymphadenopathy nor thyromegaly or carotid bruit; no JVD; There were no stridor. Neck is very supple. slgiht left facial droop.  Breasts:: Not examined CHEST WALL: No tenderness CHEST: Normal respiration, clear to auscultation bilaterally.  HEART: Regular rate and rhythm.  There are no murmur, rub, or gallops.   BACK: No kyphosis or scoliosis; no CVA tenderness ABDOMEN: soft and non-tender; no masses, no organomegaly, normal abdominal bowel sounds; no pannus; no  intertriginous candida. There is no rebound and no distention. Rectal Exam: Not done EXTREMITIES: No bone or joint deformity; age-appropriate arthropathy of the hands and knees; no edema; no ulcerations.  There is no calf tenderness. Genitalia: not examined PULSES: 2+ and symmetric SKIN: Normal hydration no rash or ulceration CNS: Cranial nerves 2-12 grossly intact no focal lateralizing neurologic deficit.  Speech is fluent; uvula elevated with phonation, facial symmetry and tongue midline. DTR are normal bilaterally, cerebella exam is intact, barbinski is negative and strengths are equaled bilaterally.  No sensory loss.   Labs on Admission:  Basic Metabolic Panel:  Recent Labs Lab 01/23/15 2232  NA 141  K 3.7  CL 111  CO2 21*  GLUCOSE 91  BUN 22*  CREATININE 0.84  CALCIUM 8.7*   Liver Function Tests:  Recent Labs Lab 01/23/15 2232  AST 16  ALT 14  ALKPHOS 72  BILITOT 0.3  PROT 7.0  ALBUMIN  3.7   CBC:  Recent Labs Lab 01/23/15 2243  WBC 9.7  NEUTROABS 4.6  HGB 13.0  HCT 39.6  MCV 89.2  PLT 242   Cardiac Enzymes:  Recent Labs Lab 01/23/15 2232  TROPONINI <0.03   Radiological Exams on Admission: Ct Angio Head W/cm &/or Wo Cm  01/24/2015   CLINICAL DATA:  Initial evaluation for acute left-sided facial droop, left-sided weakness.  EXAM: CT ANGIOGRAPHY HEAD  TECHNIQUE: Multidetector CT imaging of the head was performed using the standard protocol during bolus administration of intravenous contrast. Multiplanar CT image reconstructions and MIPs were obtained to evaluate the vascular anatomy.  CONTRAST:  14mL OMNIPAQUE IOHEXOL 350 MG/ML SOLN  COMPARISON:  Prior noncontrast head CT performed earlier on the same day.  FINDINGS: CTA HEAD  Anterior circulation: Visualized portions of the distal cervical segments of the internal carotid arteries are widely patent bilaterally. Petrous segments widely patent bilaterally. Scattered atheromatous irregularity present within the cavernous segments bilaterally without hemodynamically significant stenosis. Supraclinoid segments widely patent. A1 segments widely patent. Anterior communicating artery and anterior cerebral arteries well opacified bilaterally.  M1 segments patent without stenosis or occlusion. Distal MCA branches fairly symmetric bilaterally and well opacified.  Posterior circulation: Right vertebral artery is dominant and widely patent to the vertebrobasilar junction. Diminutive left vertebral artery divides at the takeoff of the left posterior inferior cerebellar artery with a smaller hypoplastic branch ascending towards the vertebrobasilar junction. Basilar artery widely patent. Superior cerebellar arteries well opacified bilaterally. Both the posterior cerebral arteries arise from the basilar artery and are well opacified to their distal aspects.  Venous sinuses: No acute abnormality within the venous sinuses.  Anatomic variants: No  anatomic variant.  No aneurysm.  Delayed phase:No abnormal enhancement on delayed sequence.  IMPRESSION: 1. Negative CTA of the head with no large or proximal arterial branch occlusion or hemodynamically significant stenosis identified. 2. Multifocal atheromatous irregularity within the cavernous segments of the internal carotid arteries bilaterally.   Electronically Signed   By: Jeannine Boga M.D.   On: 01/24/2015 01:08   Ct Head Wo Contrast  01/23/2015   CLINICAL DATA:  Acute onset of left-sided weakness and left facial droop. Initial encounter.  EXAM: CT HEAD WITHOUT CONTRAST  TECHNIQUE: Contiguous axial images were obtained from the base of the skull through the vertex without intravenous contrast.  COMPARISON:  MRI of the brain performed 12/25/2014, and CT of the head performed 12/24/2014  FINDINGS: There is no evidence of acute infarction, mass lesion, or intra- or extra-axial hemorrhage on CT.  Mild  periventricular white matter change likely reflects small vessel ischemic microangiopathy.  The posterior fossa, including the cerebellum, brainstem and fourth ventricle, is within normal limits. The third and lateral ventricles, and basal ganglia are unremarkable in appearance. The cerebral hemispheres are symmetric in appearance, with normal gray-white differentiation. No mass effect or midline shift is seen.  There is no evidence of fracture; visualized osseous structures are unremarkable in appearance. The orbits are within normal limits. The paranasal sinuses and mastoid air cells are well-aerated. No significant soft tissue abnormalities are seen.  IMPRESSION: 1. No acute intracranial pathology seen on CT. 2. Mild small vessel ischemic microangiopathy. These results were called by telephone at the time of interpretation on 01/23/2015 at 11:00 pm to Dr. Zenovia Jarred, who verbally acknowledged these results.   Electronically Signed   By: Garald Balding M.D.   On: 01/23/2015 23:05    EKG:  Independently reviewed.    Assessment/Plan Present on Admission:  . Migraine syndrome . Hypertensive urgency . Type 2 diabetes, uncontrolled, with neuropathy . TIA (transient ischemic attack) . Tobacco abuse . Cerebrovascular disease . Migraine  PLAN:  Will admit her for TIA/CVA work up.  Other possibility for her symptoms include complex migraine, status migraineous, conversion disorder, malingering not excluded and hypertensive encephalophathy.  Will obtain an MRI of her brain.  The CTA of her cerebral vessels showed no acute pathology.  Will resume her anti HTN meds.  Since complex migraine is a real possibility, will try to suppress her migraine.  She has been on Labetelol orally, we will add Topamax to her regimen.  She was advised to stop smoking, and keep her BP under better controlled.  She is stable, and will be admitted to Dr Legrand Rams as per prior arrangement.  Will start her on an ASA a day.  Thank you and Good day.  Other plans as per orders.  Code Status:FULL CODE.    Orvan Falconer, MD. [facility] Pager [phone] 7pm to 7am.  01/24/2015, 2:28 AM

## 2015-01-24 NOTE — ED Notes (Signed)
Patient transported to CT 

## 2015-01-24 NOTE — Progress Notes (Addendum)
Pt is to be discharged home today. Pt is in NAD, IV is out, all paperwork has been reviewed/discussed with patient, and there are no questions/concerns at this time. Pt has received and discussed STROKE EDUCATION packet. Assessment is unchanged from this morning. Pt is to be accompanied downstairs by family via ambulation. Pt refused assistance from staff and refused wheelchair.

## 2015-01-29 ENCOUNTER — Encounter: Payer: Self-pay | Admitting: *Deleted

## 2015-01-29 ENCOUNTER — Encounter: Payer: Self-pay | Admitting: Cardiovascular Disease

## 2015-01-29 ENCOUNTER — Ambulatory Visit (INDEPENDENT_AMBULATORY_CARE_PROVIDER_SITE_OTHER): Payer: Medicare Other | Admitting: Cardiovascular Disease

## 2015-01-29 ENCOUNTER — Other Ambulatory Visit: Payer: Self-pay | Admitting: Cardiovascular Disease

## 2015-01-29 VITALS — BP 178/119 | HR 80 | Ht 67.0 in | Wt 205.0 lb

## 2015-01-29 DIAGNOSIS — Z87898 Personal history of other specified conditions: Secondary | ICD-10-CM

## 2015-01-29 DIAGNOSIS — R0609 Other forms of dyspnea: Secondary | ICD-10-CM

## 2015-01-29 DIAGNOSIS — I209 Angina pectoris, unspecified: Secondary | ICD-10-CM

## 2015-01-29 DIAGNOSIS — R0600 Dyspnea, unspecified: Secondary | ICD-10-CM

## 2015-01-29 DIAGNOSIS — E785 Hyperlipidemia, unspecified: Secondary | ICD-10-CM | POA: Diagnosis not present

## 2015-01-29 DIAGNOSIS — I25118 Atherosclerotic heart disease of native coronary artery with other forms of angina pectoris: Secondary | ICD-10-CM | POA: Diagnosis not present

## 2015-01-29 DIAGNOSIS — I10 Essential (primary) hypertension: Secondary | ICD-10-CM | POA: Diagnosis not present

## 2015-01-29 DIAGNOSIS — E038 Other specified hypothyroidism: Secondary | ICD-10-CM

## 2015-01-29 DIAGNOSIS — I639 Cerebral infarction, unspecified: Secondary | ICD-10-CM

## 2015-01-29 DIAGNOSIS — I5032 Chronic diastolic (congestive) heart failure: Secondary | ICD-10-CM

## 2015-01-29 DIAGNOSIS — Z9289 Personal history of other medical treatment: Secondary | ICD-10-CM

## 2015-01-29 MED ORDER — HYDRALAZINE HCL 50 MG PO TABS: 50.0000 mg | ORAL_TABLET | Freq: Three times a day (TID) | ORAL | Status: DC

## 2015-01-29 MED ORDER — AZILSARTAN MEDOXOMIL 80 MG PO TABS: 1.0000 | ORAL_TABLET | Freq: Every day | ORAL | Status: DC

## 2015-01-29 NOTE — Patient Instructions (Addendum)
   Increase Hydralazine to 50mg  three times per day - may take 2 of your 25mg  tabs three times per day till finish current supply.  New 50mg  tabs sent to [facility] today.   Begin Edarbi 80mg  daily - new prescription sent to [facility] today also.  If prior authorization is needed, pharmacy will fax our office.  If co-pay is too much for patient, will contact drug company to see if any assistance can be given. Continue all other medications.   Your physician has requested that you have a cardiac catheterization. Cardiac catheterization is used to diagnose and/or treat various heart conditions. Doctors may recommend this procedure for a number of different reasons. The most common reason is to evaluate chest pain. Chest pain can be a symptom of coronary artery disease (CAD), and cardiac catheterization can show whether plaque is narrowing or blocking your heart's arteries. This procedure is also used to evaluate the valves, as well as measure the blood flow and oxygen levels in different parts of your heart. For further information please visit [email or web address]. Please follow instruction sheet, as given. Referral to [facility] to be seen in their Hypertension clinic.  Follow up given after discharge from heart cath procedure.

## 2015-01-29 NOTE — Progress Notes (Signed)
Patient ID: Katrina [name], female   DOB: [birth year], 54 y.o.   MRN: [id number]      SUBJECTIVE: The patient returns for follow-up of malignant hypertension. Renal artery Dopplers were normal on 06/21/14.  Hospitalized earlier this month for left-sided paresthesias and left facial droop. Head CT showed no acute abnormalities with small vessel ischemic microangiopathy. Hospitalized in July for accelerated hypertension deemed secondary to medication noncompliance. She signed out AMA.  She most recently underwent cardiac catheterization in [place] and either 2012 are 2013 by Dr. Domenica Fail at [facility] in [place], [place]. She said she has 3 blockages, one of which is 70% and the other 2 of which are 40%.  She is frustrated by her recalcitrant hypertension.  She continues to experience chest pain on a daily basis, worse with exertion. I tried [place] before but it was not covered by insurance.  I started hydralazine 25 mg 3 times daily at her last visit and she said she is compliant with all her medications.  She is here with her eldest daughter who also developed hypertension at the age of 25.   Review of Systems: As per "subjective", otherwise negative.  Allergies  Allergen Reactions  . Penicillins Anaphylaxis  . Pineapple Rash  . Strawberry Rash  . Poultry Meal     Allergic to [place]  . Aspartame And Phenylalanine Palpitations    Current Outpatient Prescriptions  Medication Sig Dispense Refill  . amLODipine (NORVASC) 10 MG tablet Take 1 tablet (10 mg total) by mouth daily. 30 tablet 6  . aspirin-acetaminophen-caffeine (EXCEDRIN MIGRAINE) [phone] MG per tablet Take 2 tablets by mouth every 6 (six) hours as needed for headache.    . carisoprodol (SOMA) 350 MG tablet Take 350 mg by mouth 4 (four) times daily.   3  . cloNIDine (CATAPRES - DOSED IN MG/24 HR) 0.3 mg/24hr patch Place 1 patch (0.3 mg total) onto the skin once a week. Sundays 4 patch 6  . dexlansoprazole (DEXILANT) 60  MG capsule Take 1 capsule (60 mg total) by mouth every evening. 30 capsule 1  . EPINEPHrine (EPI-PEN) 0.3 mg/0.3 mL DEVI Inject 0.3 mg into the muscle once as needed (allergic reaction).     . gabapentin (NEURONTIN) 600 MG tablet Take 1 tablet (600 mg total) by mouth 3 (three) times daily. 90 tablet 1  . hydrALAZINE (APRESOLINE) 25 MG tablet Take 1 tablet (25 mg total) by mouth 3 (three) times daily. 90 tablet 6  . isosorbide dinitrate (ISORDIL) 30 MG tablet Take 1 tablet (30 mg total) by mouth 2 (two) times daily. 60 tablet 6  . labetalol (NORMODYNE) 300 MG tablet Take 1 tablet (300 mg total) by mouth 2 (two) times daily. 60 tablet 1  . lisinopril (PRINIVIL,ZESTRIL) 40 MG tablet Take 1 tablet (40 mg total) by mouth daily. 30 tablet 6  . rOPINIRole (REQUIP) 3 MG tablet Take 1 tablet (3 mg total) by mouth at bedtime. 30 tablet 0  . simvastatin (ZOCOR) 20 MG tablet Take 1 tablet (20 mg total) by mouth daily. 30 tablet 3  . triamterene-hydrochlorothiazide (DYAZIDE) 37.5-25 MG per capsule Take 1 each (1 capsule total) by mouth every morning. 30 capsule 1  . zolpidem (AMBIEN) 10 MG tablet Take 1 tablet (10 mg total) by mouth at bedtime. 30 tablet 0   No current facility-administered medications for this visit.    Past Medical History  Diagnosis Date  . Type 2 diabetes mellitus   . Essential hypertension   . Hyperlipidemia   .  COPD (chronic obstructive pulmonary disease)   . GERD (gastroesophageal reflux disease)   . CAD (coronary artery disease)     Moderate nonobstructive 2012-2013, [place]  . Hypothyroid   . Lung nodules   . Diverticulosis   . Bipolar affective   . Left-sided weakness 03/20/2014    MRI brain negative for stroke.    Past Surgical History  Procedure Laterality Date  . Back surgery    . Abdominal hysterectomy    . Appendectomy    . Tonsillectomy    . Ankle surgery    . Cholecystectomy    . Nose surgery      Social History   Social History  . Marital Status:  Divorced    Spouse Name: N/A  . Number of Children: 3  . Years of Education: N/A   Occupational History  .      Disability for back pain   Social History Main Topics  . Smoking status: Current Every Day Smoker -- 2.00 packs/day for 40 years    Types: Cigarettes    Start date: 03/04/1971  . Smokeless tobacco: Never Used  . Alcohol Use: No  . Drug Use: No  . Sexual Activity: Not on file   Other Topics Concern  . Not on file   Social History Narrative     Filed Vitals:   01/29/15 1055 01/29/15 1058  BP: 182/103 178/119  Pulse: 73 80  Height: 5\' 7"  (1.702 m)   Weight: 205 lb (92.987 kg)     PHYSICAL EXAM General: NAD HEENT: Normal. Neck: No JVD, no thyromegaly. Lungs: Clear to auscultation bilaterally with normal respiratory effort. CV: Nondisplaced PMI. Regular rate and rhythm, normal S1/S2, no S3/S4, no murmur. +chest wall tenderness. No pretibial or periankle edema. No carotid bruit. Normal pedal pulses.  Abdomen: Soft, obese, no distention.  Neurologic: Alert and oriented x 3.  Psych: Somewhat flat affect. Skin: Normal. Musculoskeletal: Normal range of motion, no gross deformities. Extremities: No clubbing or cyanosis.   ECG: Most recent ECG reviewed.      ASSESSMENT AND PLAN: 1. Malignant HTN: Remains elevated. Already on clonidine patch, labetalol, lisinopril, isosorbide dinitrate, and Dyazide. Renal artery Dopplers in 06/2014 showed no evidence of stenosis.  Will increase hydralazine to 50 mg three times daily. If we can find a way to have [place] covered, would consider starting 80 mg daily and stopping lisinopril. I will add spironolactone 25 mg daily if Earnest Rosier is not covered. Creatinine 0.73 this month. I will also make a referral to the hypertension clinic at [facility] to see if she is a candidate for renal denervation, albeit an extreme option.  2. Chest pain in the context of nonobstructive CAD: I believe her chest pain is likely a function of  demand ischemia from malignant hypertension and possibly microvascular angina, but she does report a prior 70% stenosis. I will obtain coronary angiography.. I will continue isosorbide dinitrate 30 mg bid (will not take tid) and continue labetalol, ASA 81 mg, lisinopril 40 mg, along with statin therapy and amlodipine. Increase hydralazine.  3. Hypothyroidism: Controlled with normal hormone levels in October 2015. Continue levothyroxine 50 g daily.  4. Hyperlipidemia: Continue therapy with simvastatin 20 mg daily given history of coronary artery disease and history of myocardial infarction.   5. Chronic diastolic heart failure: Euvolemic at present. Takes Lasix prn. Goal is to control BP.  Dispo: f/u after cath.  Time spent: 40 minutes, of which greater than 50% was spent reviewing symptoms, relevant blood  tests and studies, and discussing management plan with the patient.   Kate Sable, M.D., F.A.C.C.

## 2015-01-30 ENCOUNTER — Telehealth: Payer: Self-pay | Admitting: Cardiovascular Disease

## 2015-01-30 MED ORDER — SPIRONOLACTONE 25 MG PO TABS: 25.0000 mg | ORAL_TABLET | Freq: Every day | ORAL | Status: DC

## 2015-01-30 NOTE — Telephone Encounter (Signed)
Pt states that they sent a pre-auth for Azilsartan Medoxomil and her ins is not going to pay for

## 2015-01-30 NOTE — Discharge Summary (Signed)
Physician Discharge Summary  Patient ID: Katrina [name] MRN: [id number] DOB/AGE: 1961-05-24 54 y.o.  Admit date: 12/18/2014 Discharge date: 01/30/2015  Admission Diagnoses: Chest pain Discharge Diagnoses:  Active Problems:   Chest pain   Tobacco abuse   Type 2 diabetes, uncontrolled, with neuropathy   Malignant hypertension   Discharged Condition: stable  Hospital Course: 54 yo female with c/o chest pain,  ED requested admission, and pt left AMA.   Consults: None  Significant Diagnostic Studies:   Treatments: none  Discharge Exam: Blood pressure 194/100, pulse 68, temperature 97.9 F (36.6 C), temperature source Oral, resp. rate 20, height 5\' 7"  (1.702 m), weight 93.441 kg (206 lb), SpO2 96 %. See H and P  Disposition: 01-Home or Self Care     Medication List    ASK your doctor about these medications        amLODipine 10 MG tablet  Commonly known as:  NORVASC  Take 1 tablet (10 mg total) by mouth daily.     aspirin-acetaminophen-caffeine [phone] MG per tablet  Commonly known as:  EXCEDRIN MIGRAINE  Take 2 tablets by mouth every 6 (six) hours as needed for headache.     carisoprodol 350 MG tablet  Commonly known as:  SOMA  Take 350 mg by mouth 4 (four) times daily.     cloNIDine 0.3 mg/24hr patch  Commonly known as:  CATAPRES - Dosed in mg/24 hr  Place 1 patch (0.3 mg total) onto the skin once a week. Sundays     dexlansoprazole 60 MG capsule  Commonly known as:  DEXILANT  Take 1 capsule (60 mg total) by mouth every evening.     EPINEPHrine 0.3 mg/0.3 mL Devi  Commonly known as:  EPI-PEN  Inject 0.3 mg into the muscle once as needed (allergic reaction).     gabapentin 600 MG tablet  Commonly known as:  NEURONTIN  Take 1 tablet (600 mg total) by mouth 3 (three) times daily.     isosorbide dinitrate 30 MG tablet  Commonly known as:  ISORDIL  Take 1 tablet (30 mg total) by mouth 2 (two) times daily.     labetalol 300 MG tablet  Commonly known  as:  NORMODYNE  Take 1 tablet (300 mg total) by mouth 2 (two) times daily.     lisinopril 40 MG tablet  Commonly known as:  PRINIVIL,ZESTRIL  Take 1 tablet (40 mg total) by mouth daily.     rOPINIRole 3 MG tablet  Commonly known as:  REQUIP  Take 1 tablet (3 mg total) by mouth at bedtime.     simvastatin 20 MG tablet  Commonly known as:  ZOCOR  Take 1 tablet (20 mg total) by mouth daily.     triamterene-hydrochlorothiazide 37.5-25 MG per capsule  Commonly known as:  DYAZIDE  Take 1 each (1 capsule total) by mouth every morning.     zolpidem 10 MG tablet  Commonly known as:  AMBIEN  Take 1 tablet (10 mg total) by mouth at bedtime.         SignedJani Gravel 01/30/2015, 10:04 AM

## 2015-01-30 NOTE — Telephone Encounter (Signed)
Discussed below with patient.  Will give Spironolactone 25mg  daily per Dr. Bronson Ing.  New sent to pharmacy today.  Will also contact rep regarding coverage.

## 2015-01-31 ENCOUNTER — Encounter (HOSPITAL_COMMUNITY): Payer: Self-pay | Admitting: Cardiovascular Disease

## 2015-01-31 ENCOUNTER — Ambulatory Visit (HOSPITAL_COMMUNITY)
Admission: RE | Admit: 2015-01-31 | Discharge: 2015-01-31 | Disposition: A | Discharge status: Home / Self Care | Payer: Medicare Other | Source: Ambulatory Visit | Attending: Cardiovascular Disease | Admitting: Cardiovascular Disease

## 2015-01-31 ENCOUNTER — Encounter (HOSPITAL_COMMUNITY)
Admission: RE | Disposition: A | Discharge status: Home / Self Care | Payer: Medicare Other | Source: Ambulatory Visit | Attending: Cardiovascular Disease

## 2015-01-31 ENCOUNTER — Emergency Department (HOSPITAL_COMMUNITY)
Admission: EM | Admit: 2015-01-31 | Discharge: 2015-01-31 | Disposition: A | Discharge status: Home / Self Care | Payer: Medicare Other | Source: Home / Self Care | Attending: Emergency Medicine | Admitting: Emergency Medicine

## 2015-01-31 DIAGNOSIS — M79641 Pain in right hand: Secondary | ICD-10-CM

## 2015-01-31 DIAGNOSIS — K219 Gastro-esophageal reflux disease without esophagitis: Secondary | ICD-10-CM | POA: Diagnosis not present

## 2015-01-31 DIAGNOSIS — M79601 Pain in right arm: Secondary | ICD-10-CM

## 2015-01-31 DIAGNOSIS — I252 Old myocardial infarction: Secondary | ICD-10-CM | POA: Diagnosis not present

## 2015-01-31 DIAGNOSIS — J449 Chronic obstructive pulmonary disease, unspecified: Secondary | ICD-10-CM | POA: Diagnosis not present

## 2015-01-31 DIAGNOSIS — R29810 Facial weakness: Secondary | ICD-10-CM | POA: Diagnosis not present

## 2015-01-31 DIAGNOSIS — E039 Hypothyroidism, unspecified: Secondary | ICD-10-CM | POA: Insufficient documentation

## 2015-01-31 DIAGNOSIS — F319 Bipolar disorder, unspecified: Secondary | ICD-10-CM | POA: Diagnosis not present

## 2015-01-31 DIAGNOSIS — F17210 Nicotine dependence, cigarettes, uncomplicated: Secondary | ICD-10-CM | POA: Insufficient documentation

## 2015-01-31 DIAGNOSIS — I25118 Atherosclerotic heart disease of native coronary artery with other forms of angina pectoris: Secondary | ICD-10-CM

## 2015-01-31 DIAGNOSIS — E785 Hyperlipidemia, unspecified: Secondary | ICD-10-CM | POA: Insufficient documentation

## 2015-01-31 DIAGNOSIS — Z9114 Patient's other noncompliance with medication regimen: Secondary | ICD-10-CM | POA: Diagnosis not present

## 2015-01-31 DIAGNOSIS — I10 Essential (primary) hypertension: Secondary | ICD-10-CM

## 2015-01-31 DIAGNOSIS — R072 Precordial pain: Secondary | ICD-10-CM | POA: Diagnosis not present

## 2015-01-31 DIAGNOSIS — I209 Angina pectoris, unspecified: Secondary | ICD-10-CM

## 2015-01-31 DIAGNOSIS — Z880 Allergy status to penicillin: Secondary | ICD-10-CM | POA: Diagnosis not present

## 2015-01-31 DIAGNOSIS — E119 Type 2 diabetes mellitus without complications: Secondary | ICD-10-CM | POA: Insufficient documentation

## 2015-01-31 DIAGNOSIS — R079 Chest pain, unspecified: Secondary | ICD-10-CM | POA: Diagnosis present

## 2015-01-31 DIAGNOSIS — I5032 Chronic diastolic (congestive) heart failure: Secondary | ICD-10-CM | POA: Diagnosis not present

## 2015-01-31 HISTORY — PX: CARDIAC CATHETERIZATION: SHX172

## 2015-01-31 LAB — BASIC METABOLIC PANEL
ANION GAP: 7 (ref 5–15)
BUN: 14 mg/dL (ref 6–20)
CALCIUM: 8.8 mg/dL — AB (ref 8.9–10.3)
CO2: 25 mmol/L (ref 22–32)
Chloride: 106 mmol/L (ref 101–111)
Creatinine, Ser: 0.78 mg/dL (ref 0.44–1.00)
GLUCOSE: 87 mg/dL (ref 65–99)
POTASSIUM: 4.3 mmol/L (ref 3.5–5.1)
Sodium: 138 mmol/L (ref 135–145)

## 2015-01-31 LAB — CBC
HEMATOCRIT: 40.7 % (ref 36.0–46.0)
HEMOGLOBIN: 13.4 g/dL (ref 12.0–15.0)
MCH: 29.3 pg (ref 26.0–34.0)
MCHC: 32.9 g/dL (ref 30.0–36.0)
MCV: 89.1 fL (ref 78.0–100.0)
Platelets: 238 10*3/uL (ref 150–400)
RBC: 4.57 MIL/uL (ref 3.87–5.11)
RDW: 14.7 % (ref 11.5–15.5)
WBC: 8 10*3/uL (ref 4.0–10.5)

## 2015-01-31 LAB — PROTIME-INR
INR: 1.13 (ref 0.00–1.49)
PROTHROMBIN TIME: 14.7 s (ref 11.6–15.2)

## 2015-01-31 LAB — GLUCOSE, CAPILLARY: Glucose-Capillary: 70 mg/dL (ref 65–99)

## 2015-01-31 SURGERY — LEFT HEART CATH AND CORONARY ANGIOGRAPHY
Anesthesia: LOCAL

## 2015-01-31 MED ORDER — SODIUM CHLORIDE 0.9 % WEIGHT BASED INFUSION: 3.0000 mL/kg/h | INTRAVENOUS | Status: DC

## 2015-01-31 MED ORDER — SODIUM CHLORIDE 0.9 % IV SOLN: 250.0000 mL | INTRAVENOUS | Status: DC | PRN

## 2015-01-31 MED ORDER — DIAZEPAM 5 MG PO TABS
10.0000 mg | ORAL_TABLET | Freq: Once | ORAL | Status: AC
  Administered 2015-01-31: 10 mg via ORAL

## 2015-01-31 MED ORDER — SODIUM CHLORIDE 0.9 % WEIGHT BASED INFUSION: 1.0000 mL/kg/h | INTRAVENOUS | Status: DC

## 2015-01-31 MED ORDER — VERAPAMIL HCL 2.5 MG/ML IV SOLN
INTRAVENOUS | Status: DC | PRN
  Administered 2015-01-31: 08:00:00 via INTRA_ARTERIAL

## 2015-01-31 MED ORDER — OXYCODONE-ACETAMINOPHEN 5-325 MG PO TABS: 1.0000 | ORAL_TABLET | ORAL | Status: DC | PRN

## 2015-01-31 MED ORDER — NITROGLYCERIN 1 MG/10 ML FOR IR/CATH LAB
INTRA_ARTERIAL | Status: AC
  Filled 2015-01-31: qty 10

## 2015-01-31 MED ORDER — FENTANYL CITRATE (PF) 100 MCG/2ML IJ SOLN
INTRAMUSCULAR | Status: AC
  Filled 2015-01-31: qty 4

## 2015-01-31 MED ORDER — SODIUM CHLORIDE 0.9 % IJ SOLN
3.0000 mL | Freq: Two times a day (BID) | INTRAMUSCULAR | Status: DC
Start: 2015-01-31 — End: 2015-01-31

## 2015-01-31 MED ORDER — SODIUM CHLORIDE 0.9 % IJ SOLN: 3.0000 mL | INTRAMUSCULAR | Status: DC | PRN

## 2015-01-31 MED ORDER — HYDRALAZINE HCL 20 MG/ML IJ SOLN
INTRAMUSCULAR | Status: AC
  Filled 2015-01-31: qty 1

## 2015-01-31 MED ORDER — MIDAZOLAM HCL 2 MG/2ML IJ SOLN
INTRAMUSCULAR | Status: AC
  Filled 2015-01-31: qty 4

## 2015-01-31 MED ORDER — SODIUM CHLORIDE 0.9 % IV SOLN: INTRAVENOUS | Status: DC

## 2015-01-31 MED ORDER — FENTANYL CITRATE (PF) 100 MCG/2ML IJ SOLN
INTRAMUSCULAR | Status: DC | PRN
  Administered 2015-01-31: 50 ug via INTRAVENOUS
  Administered 2015-01-31: 25 ug via INTRAVENOUS

## 2015-01-31 MED ORDER — MIDAZOLAM HCL 2 MG/2ML IJ SOLN
INTRAMUSCULAR | Status: DC | PRN
  Administered 2015-01-31: 2 mg via INTRAVENOUS
  Administered 2015-01-31: 1 mg via INTRAVENOUS

## 2015-01-31 MED ORDER — ASPIRIN 81 MG PO CHEW
81.0000 mg | CHEWABLE_TABLET | ORAL | Status: AC
  Administered 2015-01-31: 81 mg via ORAL

## 2015-01-31 MED ORDER — LIDOCAINE HCL (PF) 1 % IJ SOLN
INTRAMUSCULAR | Status: AC
  Filled 2015-01-31: qty 30

## 2015-01-31 MED ORDER — VERAPAMIL HCL 2.5 MG/ML IV SOLN
INTRAVENOUS | Status: AC
Start: 2015-01-31 — End: 2015-01-31
  Filled 2015-01-31: qty 2

## 2015-01-31 MED ORDER — OXYCODONE-ACETAMINOPHEN 5-325 MG PO TABS
1.0000 | ORAL_TABLET | Freq: Once | ORAL | Status: AC
Start: 2015-01-31 — End: 2015-01-31
  Administered 2015-01-31: 1 via ORAL
  Filled 2015-01-31: qty 1

## 2015-01-31 MED ORDER — IOHEXOL 350 MG/ML SOLN
INTRAVENOUS | Status: DC | PRN
  Administered 2015-01-31: 80 mL via INTRA_ARTERIAL

## 2015-01-31 MED ORDER — SODIUM CHLORIDE 0.9 % IJ SOLN: 3.0000 mL | Freq: Two times a day (BID) | INTRAMUSCULAR | Status: DC

## 2015-01-31 MED ORDER — ASPIRIN 81 MG PO CHEW
CHEWABLE_TABLET | ORAL | Status: AC
  Filled 2015-01-31: qty 1

## 2015-01-31 MED ORDER — HEPARIN SODIUM (PORCINE) 1000 UNIT/ML IJ SOLN
INTRAMUSCULAR | Status: AC
  Filled 2015-01-31: qty 1

## 2015-01-31 MED ORDER — HYDRALAZINE HCL 20 MG/ML IJ SOLN
INTRAMUSCULAR | Status: DC | PRN
  Administered 2015-01-31: 20 mg via INTRAVENOUS

## 2015-01-31 MED ORDER — DIAZEPAM 5 MG PO TABS
ORAL_TABLET | ORAL | Status: AC
  Filled 2015-01-31: qty 2

## 2015-01-31 MED ORDER — HEPARIN (PORCINE) IN NACL 2-0.9 UNIT/ML-% IJ SOLN
INTRAMUSCULAR | Status: AC
  Filled 2015-01-31: qty 1500

## 2015-01-31 SURGICAL SUPPLY — 12 items

## 2015-01-31 NOTE — Progress Notes (Signed)
Pt presents EXTREMELY nervous and anxious; unable to obtain IV access; IV Therapy notified. IV Therapy in, also unable to obtain access due to anxiousness; Genene Churn, RN notified, order for Valium 10mg  received and given

## 2015-01-31 NOTE — Discharge Instructions (Signed)
Radial Site Care °Refer to this sheet in the next few weeks. These instructions provide you with information on caring for yourself after your procedure. Your caregiver may also give you more specific instructions. Your treatment has been planned according to current medical practices, but problems sometimes occur. Call your caregiver if you have any problems or questions after your procedure. °HOME CARE INSTRUCTIONS °· You may shower the day after the procedure. Remove the bandage (dressing) and gently wash the site with plain soap and water. Gently pat the site dry. °· Do not apply powder or lotion to the site. °· Do not submerge the affected site in water for 3 to 5 days. °· Inspect the site at least twice daily. °· Do not flex or bend the affected arm for 24 hours. °· No lifting over 5 pounds (2.3 kg) for 5 days after your procedure. °· Do not drive home if you are discharged the same day of the procedure. Have someone else drive you. °· You may drive 24 hours after the procedure unless otherwise instructed by your caregiver. °· Do not operate machinery or power tools for 24 hours. °· A responsible adult should be with you for the first 24 hours after you arrive home. °What to expect: °· Any bruising will usually fade within 1 to 2 weeks. °· Blood that collects in the tissue (hematoma) may be painful to the touch. It should usually decrease in size and tenderness within 1 to 2 weeks. °SEEK IMMEDIATE MEDICAL CARE IF: °· You have unusual pain at the radial site. °· You have redness, warmth, swelling, or pain at the radial site. °· You have drainage (other than a small amount of blood on the dressing). °· You have chills. °· You have a fever or persistent symptoms for more than 72 hours. °· You have a fever and your symptoms suddenly get worse. °· Your arm becomes pale, cool, tingly, or numb. °· You have heavy bleeding from the site. Hold pressure on the site. °Document Released: 06/26/2010 Document Revised:  08/16/2011 Document Reviewed: 06/26/2010 °ExitCare® Patient Information ©2015 [employer]. This information is not intended to replace advice given to you by your health care provider. Make sure you discuss any questions you have with your health care provider. ° °

## 2015-01-31 NOTE — ED Notes (Signed)
Patient here post routine cardiac catheterization this AM. Denies chest pain, shortness of breath, weakness. States cath was done here this morning. She was sent home and about 2 hours later her arm began to hurt badly and has started to swell.

## 2015-01-31 NOTE — Discharge Instructions (Signed)
Please return tomorrow morning to arterial ultrasound of your arm/hand. Return without fail for worsening symptoms, including discoloration of the hand, numbness/weakness, or any other symptoms concerning to you. Please call your cardiologist for follow-up appointment next week.

## 2015-01-31 NOTE — ED Provider Notes (Signed)
CSN: [id number]     Arrival date & time 01/31/15  1932 History   First MD Initiated Contact with Patient 01/31/15 2102     Chief Complaint  Patient presents with  . Arm Pain  . Post-op Problem     (Consider location/radiation/quality/duration/timing/severity/associated sxs/prior Treatment) HPI  54 year old female who presents with right hand and arm pain. She has a history of type 2 diabetes, hypertension, hyperlipidemia, COPD. She underwent routine left heart catheterization this morning uncontrolled hypertension with chest pain this morning. She was not noted to have any evidence of CAD, and was discharged in stable condition. She was accessed over her right radial artery, and reports that she otherwise felt well but noticed increasing pain involving the site of access over her right wrist that extends into her hand and into the arm. She denies any numbness or weakness, and subjectively feels that it is swollen. Denies fever, chills, nausea or vomiting. She is not having chest pain or difficulty breathing. Denies discoloration of her hand.   Past Medical History  Diagnosis Date  . Type 2 diabetes mellitus   . Essential hypertension   . Hyperlipidemia   . COPD (chronic obstructive pulmonary disease)   . GERD (gastroesophageal reflux disease)   . CAD (coronary artery disease)     Moderate nonobstructive 2012-2013, [place]  . Hypothyroid   . Lung nodules   . Diverticulosis   . Bipolar affective   . Left-sided weakness 03/20/2014    MRI brain negative for stroke.   Past Surgical History  Procedure Laterality Date  . Back surgery    . Abdominal hysterectomy    . Appendectomy    . Tonsillectomy    . Ankle surgery    . Cholecystectomy    . Nose surgery    . Cardiac catheterization N/A 01/31/2015    Procedure: Left Heart Cath and Coronary Angiography;  Surgeon: Burnell Blanks, MD;  Location: [facility];  Service: Cardiovascular;  Laterality: N/A;   Family History   Problem Relation Age of Onset  . Coronary artery disease Father 37  . Emphysema Father    Social History  Substance Use Topics  . Smoking status: Current Every Day Smoker -- 2.00 packs/day for 40 years    Types: Cigarettes    Start date: 03/04/1971  . Smokeless tobacco: Never Used  . Alcohol Use: No   OB History    No data available     Review of Systems 10/14 systems reviewed and are negative other than those stated in the HPI  Allergies  Penicillins; Pineapple; Strawberry; Aspartame and phenylalanine; and Poultry meal  Home Medications   Prior to Admission medications   Medication Sig Start Date End Date Taking? Authorizing Provider  albuterol (PROVENTIL HFA;VENTOLIN HFA) 108 (90 BASE) MCG/ACT inhaler Inhale 2 puffs into the lungs every 6 (six) hours as needed for wheezing or shortness of breath.    Historical Provider, MD  amLODipine (NORVASC) 10 MG tablet Take 1 tablet (10 mg total) by mouth daily. 06/05/14   Herminio Commons, MD  aspirin-acetaminophen-caffeine (EXCEDRIN MIGRAINE) [phone] MG per tablet Take 2 tablets by mouth every 6 (six) hours as needed for headache.    Historical Provider, MD  Azilsartan Medoxomil 80 MG TABS Take 1 tablet (80 mg total) by mouth daily. 01/29/15   Herminio Commons, MD  budesonide-formoterol (SYMBICORT) 80-4.5 MCG/ACT inhaler Inhale 2 puffs into the lungs 2 (two) times daily.    Historical Provider, MD  carisoprodol (SOMA) 350  MG tablet Take 350 mg by mouth 4 (four) times daily.  05/27/14   Historical Provider, MD  cloNIDine (CATAPRES - DOSED IN MG/24 HR) 0.3 mg/24hr patch Place 1 patch (0.3 mg total) onto the skin once a week. Sundays 12/06/14   Herminio Commons, MD  dexlansoprazole (DEXILANT) 60 MG capsule Take 1 capsule (60 mg total) by mouth every evening. 03/20/14   Rexene Alberts, MD  EPINEPHrine (EPI-PEN) 0.3 mg/0.3 mL DEVI Inject 0.3 mg into the muscle once as needed (allergic reaction).     Historical Provider, MD  gabapentin  (NEURONTIN) 800 MG tablet Take 800 mg by mouth 3 (three) times daily.    Historical Provider, MD  hydrALAZINE (APRESOLINE) 50 MG tablet Take 1 tablet (50 mg total) by mouth 3 (three) times daily. 01/29/15   Herminio Commons, MD  isosorbide dinitrate (ISORDIL) 30 MG tablet Take 1 tablet (30 mg total) by mouth 2 (two) times daily. 10/01/14   Herminio Commons, MD  labetalol (NORMODYNE) 300 MG tablet Take 1 tablet (300 mg total) by mouth 2 (two) times daily. 03/20/14   Rexene Alberts, MD  lisinopril (PRINIVIL,ZESTRIL) 40 MG tablet Take 1 tablet (40 mg total) by mouth daily. 10/01/14   Herminio Commons, MD  oxyCODONE-acetaminophen (PERCOCET/ROXICET) 5-325 MG per tablet Take 1 tablet by mouth every 4 (four) hours as needed for severe pain. 01/31/15   Forde Dandy, MD  rOPINIRole (REQUIP) 3 MG tablet Take 1 tablet (3 mg total) by mouth at bedtime. 03/20/14   Rexene Alberts, MD  simvastatin (ZOCOR) 20 MG tablet Take 1 tablet (20 mg total) by mouth daily. 04/10/14   Herminio Commons, MD  spironolactone (ALDACTONE) 25 MG tablet Take 1 tablet (25 mg total) by mouth daily. 01/30/15   Herminio Commons, MD  triamterene-hydrochlorothiazide (DYAZIDE) 37.5-25 MG per capsule Take 1 each (1 capsule total) by mouth every morning. 03/20/14   Rexene Alberts, MD  zolpidem (AMBIEN) 10 MG tablet Take 1 tablet (10 mg total) by mouth at bedtime. 03/20/14   Rexene Alberts, MD   BP 199/95 mmHg  Pulse 74  Temp(Src) 99.1 F (37.3 C) (Oral)  Resp 20  Ht 5\' 7"  (1.702 m)  Wt 205 lb (92.987 kg)  BMI 32.10 kg/m2  SpO2 99% Physical Exam Physical Exam  Nursing note and vitals reviewed. Constitutional: Well developed, well nourished, non-toxic, and in no acute distress Head: Normocephalic and atraumatic.  Mouth/Throat: Oropharynx is clear and moist.  Neck: Normal range of motion. Neck supple.  Cardiovascular: Normal rate and regular rhythm.  +2 radial pulses bilaterally. No edema or swelling of the right arm or hand. < 3  seconds capillary refill in bilateral hands.   Pulmonary/Chest: Effort normal and breath sounds normal.  Abdominal: Soft. There is no tenderness. There is no rebound and no guarding.  Musculoskeletal: Normal range of motion of right upper extremity. Compartments are soft.  Neurological: Alert, no facial droop, fluent speech, moves all extremities symmetrically, full strength and sensation involving ulnar, median, and radial nerve distributions Skin: Skin is warm and dry.  Psychiatric: Cooperative  ED Course  Procedures (including critical care time) Labs Review Labs Reviewed - No data to display  Imaging Review No results found. I have personally reviewed and evaluated these images and lab results as part of my medical decision-making.    MDM   Final diagnoses:  Hand pain, right  Arm pain, diffuse, right    In short, this is a 54 year old female who presents  with right arm and hand pain after left heart catheterization procedure this morning. She is nontoxic and in no acute distress on presentation her vital signs are non-concerning aside from elevated blood pressure, but she has a known poorly controlled hypertension which she follows with cardiology for. I do not suspect that this is the etiology of her symptoms today. On examination of her right arm, the site of her left heart cath access shows some mild bruising surrounding this area, but no significant swelling or underlying hematoma. She has a strong radial pulse. Her distally she is neurovascularly intact. She appears to have a well perfused extremity. No evidence of swelling or malperfusion is noted. She also has very soft compartments. My suspicion for vascular injury or embolus is low at this time. However given her pain we did discuss performing arterial duplex of her right upper extremity. However at this hour, we do not have ultrasound capabilities, I discussed with this patient that I will order outpatient vascular arterial  duplex for her tomorrow morning, and instructed her to return for this imaging. Strict return instructions were also reviewed. She expressed understanding of all discharge instructions and felt comfortable to plan of care.    Forde Dandy, MD 02/01/15 Areta Haber

## 2015-01-31 NOTE — Progress Notes (Signed)
Pt states she will stay 10 more minutes before leaving.  TRB removed and site intact.  Fingers warm to touch. Instructed pt again to keep arm as still as possible.

## 2015-01-31 NOTE — H&P (View-Only) (Signed)
Patient ID: Katrina [name], female   DOB: May 14, 1961, 54 y.o.   MRN: [id number]      SUBJECTIVE: The patient returns for follow-up of malignant hypertension. Renal artery Dopplers were normal on 06/21/14.  Hospitalized earlier this month for left-sided paresthesias and left facial droop. Head CT showed no acute abnormalities with small vessel ischemic microangiopathy. Hospitalized in July for accelerated hypertension deemed secondary to medication noncompliance. She signed out AMA.  She most recently underwent cardiac catheterization in [place] and either 2012 are 2013 by Dr. Domenica Fail at [facility] in [place], [place]. She said she has 3 blockages, one of which is 70% and the other 2 of which are 40%.  She is frustrated by her recalcitrant hypertension.  She continues to experience chest pain on a daily basis, worse with exertion. I tried [place] before but it was not covered by insurance.  I started hydralazine 25 mg 3 times daily at her last visit and she said she is compliant with all her medications.  She is here with her eldest daughter who also developed hypertension at the age of 67.   Review of Systems: As per "subjective", otherwise negative.  Allergies  Allergen Reactions  . Penicillins Anaphylaxis  . Pineapple Rash  . Strawberry Rash  . Poultry Meal     Allergic to [place]  . Aspartame And Phenylalanine Palpitations    Current Outpatient Prescriptions  Medication Sig Dispense Refill  . amLODipine (NORVASC) 10 MG tablet Take 1 tablet (10 mg total) by mouth daily. 30 tablet 6  . aspirin-acetaminophen-caffeine (EXCEDRIN MIGRAINE) 409-811-91 MG per tablet Take 2 tablets by mouth every 6 (six) hours as needed for headache.    . carisoprodol (SOMA) 350 MG tablet Take 350 mg by mouth 4 (four) times daily.   3  . cloNIDine (CATAPRES - DOSED IN MG/24 HR) 0.3 mg/24hr patch Place 1 patch (0.3 mg total) onto the skin once a week. Sundays 4 patch 6  . dexlansoprazole (DEXILANT) 60  MG capsule Take 1 capsule (60 mg total) by mouth every evening. 30 capsule 1  . EPINEPHrine (EPI-PEN) 0.3 mg/0.3 mL DEVI Inject 0.3 mg into the muscle once as needed (allergic reaction).     . gabapentin (NEURONTIN) 600 MG tablet Take 1 tablet (600 mg total) by mouth 3 (three) times daily. 90 tablet 1  . hydrALAZINE (APRESOLINE) 25 MG tablet Take 1 tablet (25 mg total) by mouth 3 (three) times daily. 90 tablet 6  . isosorbide dinitrate (ISORDIL) 30 MG tablet Take 1 tablet (30 mg total) by mouth 2 (two) times daily. 60 tablet 6  . labetalol (NORMODYNE) 300 MG tablet Take 1 tablet (300 mg total) by mouth 2 (two) times daily. 60 tablet 1  . lisinopril (PRINIVIL,ZESTRIL) 40 MG tablet Take 1 tablet (40 mg total) by mouth daily. 30 tablet 6  . rOPINIRole (REQUIP) 3 MG tablet Take 1 tablet (3 mg total) by mouth at bedtime. 30 tablet 0  . simvastatin (ZOCOR) 20 MG tablet Take 1 tablet (20 mg total) by mouth daily. 30 tablet 3  . triamterene-hydrochlorothiazide (DYAZIDE) 37.5-25 MG per capsule Take 1 each (1 capsule total) by mouth every morning. 30 capsule 1  . zolpidem (AMBIEN) 10 MG tablet Take 1 tablet (10 mg total) by mouth at bedtime. 30 tablet 0   No current facility-administered medications for this visit.    Past Medical History  Diagnosis Date  . Type 2 diabetes mellitus   . Essential hypertension   . Hyperlipidemia   .  COPD (chronic obstructive pulmonary disease)   . GERD (gastroesophageal reflux disease)   . CAD (coronary artery disease)     Moderate nonobstructive 2012-2013, [place]  . Hypothyroid   . Lung nodules   . Diverticulosis   . Bipolar affective   . Left-sided weakness 03/20/2014    MRI brain negative for stroke.    Past Surgical History  Procedure Laterality Date  . Back surgery    . Abdominal hysterectomy    . Appendectomy    . Tonsillectomy    . Ankle surgery    . Cholecystectomy    . Nose surgery      Social History   Social History  . Marital Status:  Divorced    Spouse Name: N/A  . Number of Children: 3  . Years of Education: N/A   Occupational History  .      Disability for back pain   Social History Main Topics  . Smoking status: Current Every Day Smoker -- 2.00 packs/day for 40 years    Types: Cigarettes    Start date: 03/04/1971  . Smokeless tobacco: Never Used  . Alcohol Use: No  . Drug Use: No  . Sexual Activity: Not on file   Other Topics Concern  . Not on file   Social History Narrative     Filed Vitals:   01/29/15 1055 01/29/15 1058  BP: 182/103 178/119  Pulse: 73 80  Height: 5\' 7"  (1.702 m)   Weight: 205 lb (92.987 kg)     PHYSICAL EXAM General: NAD HEENT: Normal. Neck: No JVD, no thyromegaly. Lungs: Clear to auscultation bilaterally with normal respiratory effort. CV: Nondisplaced PMI. Regular rate and rhythm, normal S1/S2, no S3/S4, no murmur. +chest wall tenderness. No pretibial or periankle edema. No carotid bruit. Normal pedal pulses.  Abdomen: Soft, obese, no distention.  Neurologic: Alert and oriented x 3.  Psych: Somewhat flat affect. Skin: Normal. Musculoskeletal: Normal range of motion, no gross deformities. Extremities: No clubbing or cyanosis.   ECG: Most recent ECG reviewed.      ASSESSMENT AND PLAN: 1. Malignant HTN: Remains elevated. Already on clonidine patch, labetalol, lisinopril, isosorbide dinitrate, and Dyazide. Renal artery Dopplers in 06/2014 showed no evidence of stenosis.  Will increase hydralazine to 50 mg three times daily. If we can find a way to have [place] covered, would consider starting 80 mg daily and stopping lisinopril. I will add spironolactone 25 mg daily if Earnest Rosier is not covered. Creatinine 0.73 this month. I will also make a referral to the hypertension clinic at [facility] to see if she is a candidate for renal denervation, albeit an extreme option.  2. Chest pain in the context of nonobstructive CAD: I believe her chest pain is likely a function of  demand ischemia from malignant hypertension and possibly microvascular angina, but she does report a prior 70% stenosis. I will obtain coronary angiography.. I will continue isosorbide dinitrate 30 mg bid (will not take tid) and continue labetalol, ASA 81 mg, lisinopril 40 mg, along with statin therapy and amlodipine. Increase hydralazine.  3. Hypothyroidism: Controlled with normal hormone levels in October 2015. Continue levothyroxine 50 g daily.  4. Hyperlipidemia: Continue therapy with simvastatin 20 mg daily given history of coronary artery disease and history of myocardial infarction.   5. Chronic diastolic heart failure: Euvolemic at present. Takes Lasix prn. Goal is to control BP.  Dispo: f/u after cath.  Time spent: 40 minutes, of which greater than 50% was spent reviewing symptoms, relevant blood  tests and studies, and discussing management plan with the patient.   Kate Sable, M.D., F.A.C.C.

## 2015-01-31 NOTE — Progress Notes (Signed)
Pt. Arrived stating she was leaving AMA.  Encouraged pt to stay until TRB removed.  Dr. Angelena Form notified and came to talk to pt. Anderson Malta notified also.  Continuing to encourage pt to stay for TRB removal.

## 2015-01-31 NOTE — Interval H&P Note (Signed)
History and Physical Interval Note:  01/31/2015 7:59 AM  Katrina [name]  has presented today for cardiac cath with the diagnosis of cp, cad  The various methods of treatment have been discussed with the patient and family. After consideration of risks, benefits and other options for treatment, the patient has consented to  Procedure(s): Left Heart Cath and Coronary Angiography (N/A) as a surgical intervention .  The patient's history has been reviewed, patient examined, no change in status, stable for surgery.  I have reviewed the patient's chart and labs.  Questions were answered to the patient's satisfaction.    Cath Lab Visit (complete for each Cath Lab visit)  Clinical Evaluation Leading to the Procedure:   ACS: No.  Non-ACS:    Anginal Classification: CCS III  Anti-ischemic medical therapy: Maximal Therapy (2 or more classes of medications)  Non-Invasive Test Results: No non-invasive testing performed  Prior CABG: No previous CABG        Tamika Shropshire

## 2015-01-31 NOTE — Progress Notes (Signed)
Assisted patient with recovery due to pateint status. Tr band was managed with complications. Titration of removal began at 900am with 3cc removed. Right radial is stable and pulses were present. Color was pink, refill 3 secs. Site free of bleeding with no hematoma present. Patient offered nourishments and was able to smoke to calm herself down. Total removal of air from TR Band 14cc at 945am. No vitals were taken during deflation of band.  Carol Bope, RT-R, RCIS

## 2015-01-31 NOTE — ED Notes (Signed)
Pt initially refusing to put gown on, refusing to put cardiac monitor on.

## 2015-02-01 ENCOUNTER — Inpatient Hospital Stay (HOSPITAL_COMMUNITY): Admission: RE | Admit: 2015-02-01 | Payer: Medicare Other | Source: Ambulatory Visit

## 2015-02-03 DIAGNOSIS — I209 Angina pectoris, unspecified: Secondary | ICD-10-CM | POA: Diagnosis not present

## 2015-02-03 DIAGNOSIS — Z720 Tobacco use: Secondary | ICD-10-CM | POA: Diagnosis not present

## 2015-02-03 DIAGNOSIS — I2510 Atherosclerotic heart disease of native coronary artery without angina pectoris: Secondary | ICD-10-CM | POA: Diagnosis not present

## 2015-02-03 DIAGNOSIS — J45909 Unspecified asthma, uncomplicated: Secondary | ICD-10-CM | POA: Diagnosis not present

## 2015-02-03 DIAGNOSIS — I10 Essential (primary) hypertension: Secondary | ICD-10-CM | POA: Diagnosis not present

## 2015-02-03 DIAGNOSIS — J449 Chronic obstructive pulmonary disease, unspecified: Secondary | ICD-10-CM | POA: Diagnosis not present

## 2015-02-03 DIAGNOSIS — Z8673 Personal history of transient ischemic attack (TIA), and cerebral infarction without residual deficits: Secondary | ICD-10-CM | POA: Diagnosis not present

## 2015-02-03 DIAGNOSIS — L03113 Cellulitis of right upper limb: Secondary | ICD-10-CM | POA: Diagnosis not present

## 2015-02-03 MED FILL — Lidocaine HCl Local Preservative Free (PF) Inj 1%: INTRAMUSCULAR | Qty: 30 | Status: AC

## 2015-02-03 MED FILL — Heparin Sodium (Porcine) 2 Unit/ML in Sodium Chloride 0.9%: INTRAMUSCULAR | Qty: 1500 | Status: AC

## 2015-02-03 MED FILL — Nitroglycerin IV Soln 100 MCG/ML in D5W: INTRA_ARTERIAL | Qty: 10 | Status: AC

## 2015-02-04 ENCOUNTER — Other Ambulatory Visit: Payer: Self-pay | Admitting: Neurology

## 2015-02-04 DIAGNOSIS — I639 Cerebral infarction, unspecified: Secondary | ICD-10-CM

## 2015-02-04 DIAGNOSIS — G4459 Other complicated headache syndrome: Secondary | ICD-10-CM | POA: Diagnosis not present

## 2015-02-04 DIAGNOSIS — F5105 Insomnia due to other mental disorder: Secondary | ICD-10-CM | POA: Diagnosis not present

## 2015-02-04 DIAGNOSIS — R251 Tremor, unspecified: Secondary | ICD-10-CM | POA: Diagnosis not present

## 2015-02-05 NOTE — Telephone Encounter (Signed)
Message left on voice mail with questions for a return call.  Returned call to patient.  Stated that she was able to get the Spironolactone, started yesterday.    Multiple complaints about cath site.  Stated she had swelling & painful to touch all the way up to her elbow.  Also, has red line or streaking all the way up her arm.  Stated that she did got to ED for this on 01/31/15 & they advised her to come back the next morning for ultrasound of that arm.  Stated she did not go because her daughter had to work.  Again advised her to go to ED in light of these symptoms.  Needs to have ultrasound as the ED previously suggested.  If unable to go to ED, should at the least go to urgent care to have her arm looked at tonight.  Patient verbalized understanding.

## 2015-02-07 ENCOUNTER — Ambulatory Visit (HOSPITAL_COMMUNITY)
Admission: RE | Admit: 2015-02-07 | Discharge: 2015-02-07 | Disposition: A | Discharge status: Home / Self Care | Payer: Medicare Other | Source: Ambulatory Visit | Attending: Neurology | Admitting: Neurology

## 2015-02-07 DIAGNOSIS — I63232 Cerebral infarction due to unspecified occlusion or stenosis of left carotid arteries: Secondary | ICD-10-CM | POA: Diagnosis not present

## 2015-02-07 DIAGNOSIS — I639 Cerebral infarction, unspecified: Secondary | ICD-10-CM | POA: Diagnosis not present

## 2015-02-07 DIAGNOSIS — I6523 Occlusion and stenosis of bilateral carotid arteries: Secondary | ICD-10-CM | POA: Diagnosis not present

## 2015-02-07 DIAGNOSIS — I63231 Cerebral infarction due to unspecified occlusion or stenosis of right carotid arteries: Secondary | ICD-10-CM | POA: Diagnosis not present

## 2015-02-20 ENCOUNTER — Ambulatory Visit (INDEPENDENT_AMBULATORY_CARE_PROVIDER_SITE_OTHER): Payer: Medicare Other | Admitting: Adult Health

## 2015-02-20 ENCOUNTER — Encounter: Payer: Self-pay | Admitting: Adult Health

## 2015-02-20 VITALS — BP 180/100 | HR 61 | Ht 67.0 in | Wt 208.6 lb

## 2015-02-20 DIAGNOSIS — I639 Cerebral infarction, unspecified: Secondary | ICD-10-CM

## 2015-02-20 DIAGNOSIS — F17210 Nicotine dependence, cigarettes, uncomplicated: Secondary | ICD-10-CM

## 2015-02-20 DIAGNOSIS — I159 Secondary hypertension, unspecified: Secondary | ICD-10-CM | POA: Diagnosis not present

## 2015-02-20 DIAGNOSIS — Z720 Tobacco use: Secondary | ICD-10-CM

## 2015-02-20 NOTE — Progress Notes (Deleted)
Name: Katrina [name]    DOB: [birth year]  Age: 54 y.o.  MR#: [id number]       PCP:  Rosita Fire, MD      Insurance: Payor: MEDICARE / Plan: MEDICARE PART A AND B / Product Type: *No Product type* /   CC:    Chief Complaint  Patient presents with  . Chest Pain    Non-Cardiac    VS Filed Vitals:   02/20/15 1419  BP: 180/100  Pulse: 61  Height: 5\' 7"  (1.702 m)  Weight: 208 lb 9.6 oz (94.62 kg)  SpO2: 97%    Weights Current Weight  02/20/15 208 lb 9.6 oz (94.62 kg)  01/31/15 205 lb (92.987 kg)  01/31/15 205 lb (92.987 kg)    Blood Pressure  BP Readings from Last 3 Encounters:  02/20/15 180/100  01/31/15 199/95  01/31/15 144/72     Admit date:  (Not on file) Last encounter with RMR:  Visit date not found   Allergy Penicillins; Pineapple; Strawberry; Aspartame and phenylalanine; and Poultry meal  Current Outpatient Prescriptions  Medication Sig Dispense Refill  . albuterol (PROVENTIL HFA;VENTOLIN HFA) 108 (90 BASE) MCG/ACT inhaler Inhale 2 puffs into the lungs every 6 (six) hours as needed for wheezing or shortness of breath.    Marland Kitchen amLODipine (NORVASC) 10 MG tablet Take 1 tablet (10 mg total) by mouth daily. 30 tablet 6  . aspirin-acetaminophen-caffeine (EXCEDRIN MIGRAINE) 194-174-08 MG per tablet Take 2 tablets by mouth every 6 (six) hours as needed for headache.    . Azilsartan Medoxomil 80 MG TABS Take 1 tablet (80 mg total) by mouth daily. 30 tablet 6  . budesonide-formoterol (SYMBICORT) 80-4.5 MCG/ACT inhaler Inhale 2 puffs into the lungs 2 (two) times daily.    . carisoprodol (SOMA) 350 MG tablet Take 350 mg by mouth 4 (four) times daily.   3  . cloNIDine (CATAPRES - DOSED IN MG/24 HR) 0.3 mg/24hr patch Place 1 patch (0.3 mg total) onto the skin once a week. Sundays 4 patch 6  . dexlansoprazole (DEXILANT) 60 MG capsule Take 1 capsule (60 mg total) by mouth every evening. 30 capsule 1  . EPINEPHrine (EPI-PEN) 0.3 mg/0.3 mL DEVI Inject 0.3 mg into the muscle once as  needed (allergic reaction).     . gabapentin (NEURONTIN) 800 MG tablet Take 800 mg by mouth 3 (three) times daily.    . hydrALAZINE (APRESOLINE) 50 MG tablet Take 1 tablet (50 mg total) by mouth 3 (three) times daily. 90 tablet 6  . isosorbide dinitrate (ISORDIL) 30 MG tablet Take 1 tablet (30 mg total) by mouth 2 (two) times daily. 60 tablet 6  . labetalol (NORMODYNE) 300 MG tablet Take 1 tablet (300 mg total) by mouth 2 (two) times daily. 60 tablet 1  . lisinopril (PRINIVIL,ZESTRIL) 40 MG tablet Take 1 tablet (40 mg total) by mouth daily. 30 tablet 6  . oxyCODONE-acetaminophen (PERCOCET/ROXICET) 5-325 MG per tablet Take 1 tablet by mouth every 4 (four) hours as needed for severe pain. 3 tablet 0  . rOPINIRole (REQUIP) 3 MG tablet Take 1 tablet (3 mg total) by mouth at bedtime. 30 tablet 0  . simvastatin (ZOCOR) 20 MG tablet Take 1 tablet (20 mg total) by mouth daily. 30 tablet 3  . spironolactone (ALDACTONE) 25 MG tablet Take 1 tablet (25 mg total) by mouth daily. 30 tablet 6  . triamterene-hydrochlorothiazide (DYAZIDE) 37.5-25 MG per capsule Take 1 each (1 capsule total) by mouth every morning. 30 capsule 1  .  zolpidem (AMBIEN) 10 MG tablet Take 1 tablet (10 mg total) by mouth at bedtime. 30 tablet 0   No current facility-administered medications for this visit.    Discontinued Meds:   There are no discontinued medications.  Patient Active Problem List   Diagnosis Date Noted  . Precordial pain   . Migraine syndrome 01/24/2015  . Migraine 01/24/2015  . Accelerated hypertension 12/25/2014  . TIA (transient ischemic attack) 12/25/2014  . Hypertensive urgency   . Other chest pain   . Malignant hypertension   . Hypokalemia 04/16/2014  . Muscle weakness (generalized) 04/05/2014  . Stiffness of left hip joint 04/05/2014  . Pain in left hip 04/05/2014  . Diastolic dysfunction 30/86/5784  . CAD (coronary artery disease) 03/20/2014  . PUD (peptic ulcer disease) 03/20/2014  . Type 2  diabetes, uncontrolled, with neuropathy 03/20/2014  . Morbid obesity 03/20/2014  . Left-sided weakness 03/20/2014  . Paresthesias 03/20/2014  . Cerebrovascular disease 03/20/2014  . Dyspnea 03/05/2012  . Chest pain 03/05/2012  . Tobacco abuse 03/05/2012  . COPD (chronic obstructive pulmonary disease) 11/23/2011  . GERD (gastroesophageal reflux disease) 11/23/2011  . HTN (hypertension) 11/23/2011    LABS    Component Value Date/Time   NA 138 01/31/2015 0636   NA 141 01/23/2015 2232   NA 138 12/24/2014 1942   K 4.3 01/31/2015 0636   K 3.7 01/23/2015 2232   K 3.9 12/24/2014 1942   CL 106 01/31/2015 0636   CL 111 01/23/2015 2232   CL 106 12/24/2014 1942   CO2 25 01/31/2015 0636   CO2 21* 01/23/2015 2232   CO2 24 12/24/2014 1942   GLUCOSE 87 01/31/2015 0636   GLUCOSE 91 01/23/2015 2232   GLUCOSE 106* 12/24/2014 1942   BUN 14 01/31/2015 0636   BUN 22* 01/23/2015 2232   BUN 17 12/24/2014 1942   CREATININE 0.78 01/31/2015 0636   CREATININE 0.73 01/24/2015 0749   CREATININE 0.84 01/23/2015 2232   CALCIUM 8.8* 01/31/2015 0636   CALCIUM 8.7* 01/23/2015 2232   CALCIUM 9.2 12/24/2014 1942   GFRNONAA >60 01/31/2015 0636   GFRNONAA >60 01/24/2015 0749   GFRNONAA >60 01/23/2015 2232   GFRAA >60 01/31/2015 0636   GFRAA >60 01/24/2015 0749   GFRAA >60 01/23/2015 2232   CMP     Component Value Date/Time   NA 138 01/31/2015 0636   K 4.3 01/31/2015 0636   CL 106 01/31/2015 0636   CO2 25 01/31/2015 0636   GLUCOSE 87 01/31/2015 0636   BUN 14 01/31/2015 0636   CREATININE 0.78 01/31/2015 0636   CALCIUM 8.8* 01/31/2015 0636   PROT 7.0 01/23/2015 2232   ALBUMIN 3.7 01/23/2015 2232   AST 16 01/23/2015 2232   ALT 14 01/23/2015 2232   ALKPHOS 72 01/23/2015 2232   BILITOT 0.3 01/23/2015 2232   GFRNONAA >60 01/31/2015 0636   GFRAA >60 01/31/2015 0636       Component Value Date/Time   WBC 8.0 01/31/2015 0636   WBC 7.4 01/24/2015 0749   WBC 9.7 01/23/2015 2243   HGB 13.4  01/31/2015 0636   HGB 12.3 01/24/2015 0749   HGB 13.0 01/23/2015 2243   HCT 40.7 01/31/2015 0636   HCT 38.0 01/24/2015 0749   HCT 39.6 01/23/2015 2243   MCV 89.1 01/31/2015 0636   MCV 90.0 01/24/2015 0749   MCV 89.2 01/23/2015 2243    Lipid Panel     Component Value Date/Time   CHOL 133 03/19/2014 0449   TRIG 131 03/19/2014 0449  HDL 30* 03/19/2014 0449   CHOLHDL 4.4 03/19/2014 0449   VLDL 26 03/19/2014 0449   LDLCALC 77 03/19/2014 0449    ABG    Component Value Date/Time   TCO2 20 07/06/2014 2054     Lab Results  Component Value Date   TSH 3.228 01/24/2015   BNP (last 3 results) No results for input(s): BNP in the last 8760 hours.  ProBNP (last 3 results) No results for input(s): PROBNP in the last 8760 hours.  Cardiac Panel (last 3 results) No results for input(s): CKTOTAL, CKMB, TROPONINI, RELINDX in the last 72 hours.  Iron/TIBC/Ferritin/ %Sat No results found for: IRON, TIBC, FERRITIN, IRONPCTSAT   EKG Orders placed or performed during the hospital encounter of 01/23/15  . EKG 12-Lead  . EKG 12-Lead     Prior Assessment and Plan Problem List as of 02/20/2015      Cardiovascular and Mediastinum   HTN (hypertension)   CAD (coronary artery disease)   Cerebrovascular disease   Malignant hypertension   Accelerated hypertension   TIA (transient ischemic attack)   Hypertensive urgency   Migraine syndrome   Migraine     Respiratory   COPD (chronic obstructive pulmonary disease)   Last Assessment & Plan 03/09/2012 Office Visit Edited 03/10/2012  9:57 AM by Melvenia Needles, NP    Preserved lung fxn on PFT, small reversibility in mid flows but suspect most of her symptoms  Are multifactoral with ongoing smoking. ACE could be contributing to cough and frequent flares Previoulsy on Coreg which could have been causing some symptoms as well.  Workup thus far w/ neg ambulatory desats, and CXR  DLCO is diminished.  Deconditioning and weight gain are  contributing factors as well  Plan  She should keep cards follow up to r/o cardiac source to dyspnea.  Consider changing ACE inhibitor in future if cough/wheezing /freq flares are an issues.  Advised on smoking cessation.  No inhalers for now.  If remains symptoms once off ACE , consider QVAR  For possible asthma component.          Digestive   GERD (gastroesophageal reflux disease)   PUD (peptic ulcer disease)     Endocrine   Type 2 diabetes, uncontrolled, with neuropathy     Nervous and Auditory   Left-sided weakness     Other   Dyspnea   Last Assessment & Plan 03/03/2012 Office Visit Written 03/05/2012 10:29 PM by Brand Males, MD    COPD most likely dominant component but angina equvialent possible. Obesity and decondiitoning could be playing a role  PLAN PFT and then followup with NP or myself      Chest pain   Last Assessment & Plan 03/03/2012 Office Visit Written 03/05/2012 10:30 PM by Brand Males, MD    She seems to be having angina along with exertional dyspnea. Will refer [facility] to [facility]ology      Tobacco abuse   Last Assessment & Plan 03/03/2012 Office Visit Written 03/05/2012 10:31 PM by Brand Males, MD    [facility] is not interested in quitting but acknowledges the importance of quitting      Diastolic dysfunction   Morbid obesity   Paresthesias   Muscle weakness (generalized)   Stiffness of left hip joint   Pain in left hip   Hypokalemia   Other chest pain   Precordial pain       Imaging: Ct Angio Head W/cm &/or Wo Cm  01/24/2015   CLINICAL DATA:  Initial  evaluation for acute left-sided facial droop, left-sided weakness.  EXAM: CT ANGIOGRAPHY HEAD  TECHNIQUE: Multidetector CT imaging of the head was performed using the standard protocol during bolus administration of intravenous contrast. Multiplanar CT image reconstructions and MIPs were obtained to evaluate the vascular anatomy.  CONTRAST:  26mL OMNIPAQUE IOHEXOL 350 MG/ML SOLN   COMPARISON:  Prior noncontrast head CT performed earlier on the same day.  FINDINGS: CTA HEAD  Anterior circulation: Visualized portions of the distal cervical segments of the internal carotid arteries are widely patent bilaterally. Petrous segments widely patent bilaterally. Scattered atheromatous irregularity present within the cavernous segments bilaterally without hemodynamically significant stenosis. Supraclinoid segments widely patent. A1 segments widely patent. Anterior communicating artery and anterior cerebral arteries well opacified bilaterally.  M1 segments patent without stenosis or occlusion. Distal MCA branches fairly symmetric bilaterally and well opacified.  Posterior circulation: Right vertebral artery is dominant and widely patent to the vertebrobasilar junction. Diminutive left vertebral artery divides at the takeoff of the left posterior inferior cerebellar artery with a smaller hypoplastic branch ascending towards the vertebrobasilar junction. Basilar artery widely patent. Superior cerebellar arteries well opacified bilaterally. Both the posterior cerebral arteries arise from the basilar artery and are well opacified to their distal aspects.  Venous sinuses: No acute abnormality within the venous sinuses.  Anatomic variants: No anatomic variant.  No aneurysm.  Delayed phase:No abnormal enhancement on delayed sequence.  IMPRESSION: 1. Negative CTA of the head with no large or proximal arterial branch occlusion or hemodynamically significant stenosis identified. 2. Multifocal atheromatous irregularity within the cavernous segments of the internal carotid arteries bilaterally.   Electronically Signed   By: Jeannine Boga M.D.   On: 01/24/2015 01:08   Ct Head Wo Contrast  01/23/2015   CLINICAL DATA:  Acute onset of left-sided weakness and left facial droop. Initial encounter.  EXAM: CT HEAD WITHOUT CONTRAST  TECHNIQUE: Contiguous axial images were obtained from the base of the skull through  the vertex without intravenous contrast.  COMPARISON:  MRI of the brain performed 12/25/2014, and CT of the head performed 12/24/2014  FINDINGS: There is no evidence of acute infarction, mass lesion, or intra- or extra-axial hemorrhage on CT.  Mild periventricular white matter change likely reflects small vessel ischemic microangiopathy.  The posterior fossa, including the cerebellum, brainstem and fourth ventricle, is within normal limits. The third and lateral ventricles, and basal ganglia are unremarkable in appearance. The cerebral hemispheres are symmetric in appearance, with normal gray-white differentiation. No mass effect or midline shift is seen.  There is no evidence of fracture; visualized osseous structures are unremarkable in appearance. The orbits are within normal limits. The paranasal sinuses and mastoid air cells are well-aerated. No significant soft tissue abnormalities are seen.  IMPRESSION: 1. No acute intracranial pathology seen on CT. 2. Mild small vessel ischemic microangiopathy. These results were called by telephone at the time of interpretation on 01/23/2015 at 11:00 pm to Dr. Zenovia Jarred, who verbally acknowledged these results.   Electronically Signed   By: Garald Balding M.D.   On: 01/23/2015 23:05   US Carotid Bilateral  02/07/2015   CLINICAL DATA:  Stroke  EXAM: BILATERAL CAROTID DUPLEX ULTRASOUND  TECHNIQUE: Pearline Cables scale imaging, color Doppler and duplex ultrasound was performed of bilateral carotid and vertebral arteries in the neck.  COMPARISON:  03/19/2014  REVIEW OF SYSTEMS: Quantification of carotid stenosis is based on velocity parameters that correlate the residual internal carotid diameter with NASCET-based stenosis levels, using the diameter of the distal  internal carotid lumen as the denominator for stenosis measurement.  The following velocity measurements were obtained:  PEAK SYSTOLIC/END DIASTOLIC  RIGHT  ICA:                     73/24cm/sec  CCA:                      121/97JO/ITG  SYSTOLIC ICA/CCA RATIO:  0.7  DIASTOLIC ICA/CCA RATIO: 1.1  ECA:                     128cm/sec  LEFT  ICA:                     97/43cm/sec  CCA:                     54/98YM/EBR  SYSTOLIC ICA/CCA RATIO:  1.2  DIASTOLIC ICA/CCA RATIO: 1.9  ECA:                     100cm/sec  FINDINGS: RIGHT CAROTID ARTERY: Mild circumferential plaque in the bulb extending to the ICA origin. No high-grade stenosis. Normal waveforms and color Doppler signal.  RIGHT VERTEBRAL ARTERY:  Normal flow direction and waveform.  LEFT CAROTID ARTERY: Eccentric partially calcified plaque in the bulb and proximal ICA. No high-grade stenosis. Normal waveforms and color Doppler signal.  LEFT VERTEBRAL ARTERY: Normal flow direction and waveform.  IMPRESSION: 1. Mild bilateral carotid bifurcation and proximal ICA plaque, left greater than right, resulting in less than 50% diameter stenosis. The exam does not exclude plaque ulceration or embolization. Continued surveillance recommended.   Electronically Signed   By: Lucrezia Europe M.D.   On: 02/07/2015 13:07

## 2015-02-20 NOTE — Patient Instructions (Signed)
Your physician recommends that you schedule a follow-up appointment TBD  Your physician recommends that you continue on your current medications as directed. Please refer to the Current Medication list given to you today.  Thank you for choosing [facility]! ]

## 2015-02-20 NOTE — Progress Notes (Signed)
Cardiology Office Note   Date:  02/20/2015   ID:  Katrina [name], DOB Jun 26, 1960, MRN [id number]  PCP:  Rosita Fire, MD  Cardiologist:  Woodroe Chen, NP   Chief Complaint  Patient presents with  . Chest Pain    Non-Cardiac      History of Present Illness: Katrina [name] is a 54 y.o. female who presents for ongoing assessment and management of hypertension, non-cardiac chest pain, hyperlipidemia. Who recently had cardiac cath per Dr. Angelena Form demonstrating No angiographic evidence of CAD . Normal LV systolic function . Uncontrolled HTN.She was belligerent and agitated during the procedure. She stated that she "was walking out to smoke a cigarette."   She comes today with multiple complaints from cardiac cath. She reports that she had cellulitis in the cath site and right arm.She was seen in [place] and placed on antibiotics.  She continues to be hypertensive and has not heard from [facility] where she was referred by Dr. Bronson Ing for uncontrolled hypertension. She is on multiple medications and states she takes them everyday. She refuses new or increased doses of her medications.   Renal artery ultrasound ordered by Dr. Bronson Ing on 06/20/2014 demonstrated: Normal caliber aorta, normal kidney size bilaterally, normal renal arteries bilaterally, renal veins appear patent.   Echocardiogram  Left ventricle: The cavity size was normal. There was mild focal basal and mild concentric hypertrophy of the septum. Systolic function was vigorous. The estimated ejection fraction was in the range of 65% to 70%. Wall motion was normal; there were no regional wall motion abnormalities. Doppler parameters are consistent with abnormal left ventricular relaxation (grade 1 diastolic dysfunction). Doppler parameters are consistent with elevated ventricular end-diastolic filling pressure. - Aortic valve: Trileaflet; normal thickness leaflets. There was no regurgitation. -  Aortic root: The aortic root was normal in size. - Mitral valve: Structurally normal valve. - Left atrium: The atrium was normal in size. - Right ventricle: The cavity size was normal. Wall thickness was normal. Systolic function was normal. - Right atrium: The atrium was normal in size. - Tricuspid valve: There was mild regurgitation. - Pulmonic valve: There was no regurgitation. - Pulmonary arteries: Systolic pressure was within the normal range. - Inferior vena cava: The vessel was normal in size. - Pericardium, extracardiac: There was no pericardial effusion.  Past Medical History  Diagnosis Date  . Type 2 diabetes mellitus   . Essential hypertension   . Hyperlipidemia   . COPD (chronic obstructive pulmonary disease)   . GERD (gastroesophageal reflux disease)   . CAD (coronary artery disease)     Moderate nonobstructive 2012-2013, [place]  . Hypothyroid   . Lung nodules   . Diverticulosis   . Bipolar affective   . Left-sided weakness 03/20/2014    MRI brain negative for stroke.    Past Surgical History  Procedure Laterality Date  . Back surgery    . Abdominal hysterectomy    . Appendectomy    . Tonsillectomy    . Ankle surgery    . Cholecystectomy    . Nose surgery    . Cardiac catheterization N/A 01/31/2015    Procedure: Left Heart Cath and Coronary Angiography;  Surgeon: Burnell Blanks, MD;  Location: [facility];  Service: Cardiovascular;  Laterality: N/A;     Current Outpatient Prescriptions  Medication Sig Dispense Refill  . albuterol (PROVENTIL HFA;VENTOLIN HFA) 108 (90 BASE) MCG/ACT inhaler Inhale 2 puffs into the lungs every 6 (six) hours as needed for wheezing or shortness  of breath.    Marland Kitchen amLODipine (NORVASC) 10 MG tablet Take 1 tablet (10 mg total) by mouth daily. 30 tablet 6  . aspirin-acetaminophen-caffeine (EXCEDRIN MIGRAINE) 585-277-82 MG per tablet Take 2 tablets by mouth every 6 (six) hours as needed for headache.    . Azilsartan  Medoxomil 80 MG TABS Take 1 tablet (80 mg total) by mouth daily. 30 tablet 6  . budesonide-formoterol (SYMBICORT) 80-4.5 MCG/ACT inhaler Inhale 2 puffs into the lungs 2 (two) times daily.    . carisoprodol (SOMA) 350 MG tablet Take 350 mg by mouth 4 (four) times daily.   3  . cloNIDine (CATAPRES - DOSED IN MG/24 HR) 0.3 mg/24hr patch Place 1 patch (0.3 mg total) onto the skin once a week. Sundays 4 patch 6  . dexlansoprazole (DEXILANT) 60 MG capsule Take 1 capsule (60 mg total) by mouth every evening. 30 capsule 1  . EPINEPHrine (EPI-PEN) 0.3 mg/0.3 mL DEVI Inject 0.3 mg into the muscle once as needed (allergic reaction).     . gabapentin (NEURONTIN) 800 MG tablet Take 800 mg by mouth 3 (three) times daily.    . hydrALAZINE (APRESOLINE) 50 MG tablet Take 1 tablet (50 mg total) by mouth 3 (three) times daily. 90 tablet 6  . isosorbide dinitrate (ISORDIL) 30 MG tablet Take 1 tablet (30 mg total) by mouth 2 (two) times daily. 60 tablet 6  . labetalol (NORMODYNE) 300 MG tablet Take 1 tablet (300 mg total) by mouth 2 (two) times daily. 60 tablet 1  . lisinopril (PRINIVIL,ZESTRIL) 40 MG tablet Take 1 tablet (40 mg total) by mouth daily. 30 tablet 6  . oxyCODONE-acetaminophen (PERCOCET/ROXICET) 5-325 MG per tablet Take 1 tablet by mouth every 4 (four) hours as needed for severe pain. 3 tablet 0  . rOPINIRole (REQUIP) 3 MG tablet Take 1 tablet (3 mg total) by mouth at bedtime. 30 tablet 0  . simvastatin (ZOCOR) 20 MG tablet Take 1 tablet (20 mg total) by mouth daily. 30 tablet 3  . spironolactone (ALDACTONE) 25 MG tablet Take 1 tablet (25 mg total) by mouth daily. 30 tablet 6  . triamterene-hydrochlorothiazide (DYAZIDE) 37.5-25 MG per capsule Take 1 each (1 capsule total) by mouth every morning. 30 capsule 1  . zolpidem (AMBIEN) 10 MG tablet Take 1 tablet (10 mg total) by mouth at bedtime. 30 tablet 0   No current facility-administered medications for this visit.    Allergies:   Penicillins; Pineapple;  Strawberry; Aspartame and phenylalanine; and Poultry meal    Social History:  The patient  reports that she has been smoking Cigarettes.  She started smoking about 43 years ago. She has a 80 pack-year smoking history. She has never used smokeless tobacco. She reports that she does not drink alcohol or use illicit drugs.   Family History:  The patient's family history includes Coronary artery disease (age of onset: 24) in her father; Emphysema in her father.    ROS: All other systems are reviewed and negative. Unless otherwise mentioned in H&P    PHYSICAL EXAM: VS:  BP 180/100 mmHg  Pulse 61  Ht 5\' 7"  (1.702 m)  Wt 208 lb 9.6 oz (94.62 kg)  BMI 32.66 kg/m2  SpO2 97% , BMI Body mass index is 32.66 kg/(m^2). GEN: Well nourished, well developed, in no acute distress HEENT: normal Neck: no JVD, carotid bruits, or masses Cardiac: RRR; no murmurs, rubs, S4  Gallop is noted ,no edema  Respiratory:  clear to auscultation bilaterally, normal work of breathing  GI: soft, nontender, nondistended, + BS MS: no deformity or atrophy Skin: warm and dry, no rash Neuro:  Strength and sensation are intact Psych: euthymic mood, full affect  Recent Labs: 01/23/2015: ALT 14 01/24/2015: TSH 3.228 01/31/2015: BUN 14; Creatinine, Ser 0.78; Hemoglobin 13.4; Platelets 238; Potassium 4.3; Sodium 138    Lipid Panel    Component Value Date/Time   CHOL 133 03/19/2014 0449   TRIG 131 03/19/2014 0449   HDL 30* 03/19/2014 0449   CHOLHDL 4.4 03/19/2014 0449   VLDL 26 03/19/2014 0449   LDLCALC 77 03/19/2014 0449      Wt Readings from Last 3 Encounters:  02/20/15 208 lb 9.6 oz (94.62 kg)  01/31/15 205 lb (92.987 kg)  01/31/15 205 lb (92.987 kg)      ASSESSMENT AND PLAN:  1. Malignant Hypertension: She claims she is compliant with her medications. She is on clonidine patch, hydralazine, labatolol, spironolactone, lisinopril, isosorbide, azilsartan medoxomil but remains very hypertensive. I have checked  her BP in both arms. 180'/ 100's bilaterally. . I am not convinced that she is compliant. She is being referred to [facility] per Dr. Bronson Ing. Renal artery ultrasound was normal. Await their recommendations. Consider subclavian steal. She refuses adjustment in her medications.   2. Ongoing tobacco abuse: She is counseled on tobacco cessation for 5 mins. She does not want to consider stopping.   Current medicines are reviewed at length with the patient today.     No orders of the defined types were placed in this encounter.     Disposition:   FU with Dr. Bronson Ing after [facility] evaluation.   Signed, Jory Sims, NP  02/20/2015 2:43 PM    [facility] [address], [place], [place] [address] Phone: [phone]; Fax: [phone]

## 2015-03-04 DIAGNOSIS — J449 Chronic obstructive pulmonary disease, unspecified: Secondary | ICD-10-CM | POA: Diagnosis not present

## 2015-03-04 DIAGNOSIS — I10 Essential (primary) hypertension: Secondary | ICD-10-CM | POA: Diagnosis not present

## 2015-03-04 DIAGNOSIS — R110 Nausea: Secondary | ICD-10-CM | POA: Diagnosis not present

## 2015-03-04 DIAGNOSIS — G43909 Migraine, unspecified, not intractable, without status migrainosus: Secondary | ICD-10-CM | POA: Diagnosis not present

## 2015-03-04 DIAGNOSIS — R079 Chest pain, unspecified: Secondary | ICD-10-CM | POA: Diagnosis not present

## 2015-03-04 DIAGNOSIS — I252 Old myocardial infarction: Secondary | ICD-10-CM | POA: Diagnosis not present

## 2015-03-04 DIAGNOSIS — F17210 Nicotine dependence, cigarettes, uncomplicated: Secondary | ICD-10-CM | POA: Diagnosis not present

## 2015-03-04 DIAGNOSIS — R0602 Shortness of breath: Secondary | ICD-10-CM | POA: Diagnosis not present

## 2015-03-04 DIAGNOSIS — I2510 Atherosclerotic heart disease of native coronary artery without angina pectoris: Secondary | ICD-10-CM | POA: Diagnosis not present

## 2015-03-17 ENCOUNTER — Inpatient Hospital Stay: Admission: RE | Admit: 2015-03-17 | Payer: Medicare Other | Source: Ambulatory Visit

## 2015-03-17 DIAGNOSIS — F329 Major depressive disorder, single episode, unspecified: Secondary | ICD-10-CM | POA: Diagnosis not present

## 2015-03-17 DIAGNOSIS — E119 Type 2 diabetes mellitus without complications: Secondary | ICD-10-CM | POA: Diagnosis not present

## 2015-03-17 DIAGNOSIS — Z9049 Acquired absence of other specified parts of digestive tract: Secondary | ICD-10-CM | POA: Diagnosis not present

## 2015-03-17 DIAGNOSIS — I10 Essential (primary) hypertension: Secondary | ICD-10-CM | POA: Diagnosis not present

## 2015-03-17 DIAGNOSIS — Z7982 Long term (current) use of aspirin: Secondary | ICD-10-CM | POA: Diagnosis not present

## 2015-03-17 DIAGNOSIS — J45909 Unspecified asthma, uncomplicated: Secondary | ICD-10-CM | POA: Diagnosis not present

## 2015-03-17 DIAGNOSIS — I2510 Atherosclerotic heart disease of native coronary artery without angina pectoris: Secondary | ICD-10-CM | POA: Diagnosis not present

## 2015-03-17 DIAGNOSIS — J449 Chronic obstructive pulmonary disease, unspecified: Secondary | ICD-10-CM | POA: Diagnosis not present

## 2015-03-17 DIAGNOSIS — Z7902 Long term (current) use of antithrombotics/antiplatelets: Secondary | ICD-10-CM | POA: Diagnosis not present

## 2015-03-17 DIAGNOSIS — Z7984 Long term (current) use of oral hypoglycemic drugs: Secondary | ICD-10-CM | POA: Diagnosis not present

## 2015-03-17 DIAGNOSIS — R220 Localized swelling, mass and lump, head: Secondary | ICD-10-CM | POA: Diagnosis not present

## 2015-03-17 DIAGNOSIS — F17210 Nicotine dependence, cigarettes, uncomplicated: Secondary | ICD-10-CM | POA: Diagnosis not present

## 2015-03-17 DIAGNOSIS — Z90710 Acquired absence of both cervix and uterus: Secondary | ICD-10-CM | POA: Diagnosis not present

## 2015-03-27 ENCOUNTER — Other Ambulatory Visit: Payer: Self-pay | Admitting: *Deleted

## 2015-03-28 DIAGNOSIS — Z79899 Other long term (current) drug therapy: Secondary | ICD-10-CM | POA: Diagnosis not present

## 2015-03-28 DIAGNOSIS — I10 Essential (primary) hypertension: Secondary | ICD-10-CM | POA: Diagnosis not present

## 2015-03-28 DIAGNOSIS — E039 Hypothyroidism, unspecified: Secondary | ICD-10-CM | POA: Diagnosis not present

## 2015-03-28 DIAGNOSIS — E785 Hyperlipidemia, unspecified: Secondary | ICD-10-CM | POA: Diagnosis not present

## 2015-03-28 DIAGNOSIS — F17200 Nicotine dependence, unspecified, uncomplicated: Secondary | ICD-10-CM | POA: Diagnosis not present

## 2015-03-28 DIAGNOSIS — I2510 Atherosclerotic heart disease of native coronary artery without angina pectoris: Secondary | ICD-10-CM | POA: Diagnosis not present

## 2015-03-30 DIAGNOSIS — I110 Hypertensive heart disease with heart failure: Secondary | ICD-10-CM | POA: Diagnosis not present

## 2015-03-30 DIAGNOSIS — G8929 Other chronic pain: Secondary | ICD-10-CM | POA: Diagnosis present

## 2015-03-30 DIAGNOSIS — R51 Headache: Secondary | ICD-10-CM | POA: Diagnosis not present

## 2015-03-30 DIAGNOSIS — I10 Essential (primary) hypertension: Secondary | ICD-10-CM | POA: Diagnosis not present

## 2015-03-30 DIAGNOSIS — E119 Type 2 diabetes mellitus without complications: Secondary | ICD-10-CM | POA: Diagnosis not present

## 2015-03-30 DIAGNOSIS — Z7401 Bed confinement status: Secondary | ICD-10-CM | POA: Diagnosis not present

## 2015-03-30 DIAGNOSIS — G458 Other transient cerebral ischemic attacks and related syndromes: Secondary | ICD-10-CM | POA: Diagnosis not present

## 2015-03-30 DIAGNOSIS — E46 Unspecified protein-calorie malnutrition: Secondary | ICD-10-CM | POA: Diagnosis not present

## 2015-03-30 DIAGNOSIS — F17210 Nicotine dependence, cigarettes, uncomplicated: Secondary | ICD-10-CM | POA: Diagnosis present

## 2015-03-30 DIAGNOSIS — Z9114 Patient's other noncompliance with medication regimen: Secondary | ICD-10-CM | POA: Diagnosis not present

## 2015-03-30 DIAGNOSIS — E785 Hyperlipidemia, unspecified: Secondary | ICD-10-CM | POA: Diagnosis not present

## 2015-03-30 DIAGNOSIS — E1165 Type 2 diabetes mellitus with hyperglycemia: Secondary | ICD-10-CM | POA: Diagnosis present

## 2015-03-30 DIAGNOSIS — M545 Low back pain: Secondary | ICD-10-CM | POA: Diagnosis present

## 2015-03-30 DIAGNOSIS — E039 Hypothyroidism, unspecified: Secondary | ICD-10-CM | POA: Diagnosis not present

## 2015-03-30 DIAGNOSIS — I509 Heart failure, unspecified: Secondary | ICD-10-CM | POA: Diagnosis not present

## 2015-03-30 DIAGNOSIS — R29810 Facial weakness: Secondary | ICD-10-CM | POA: Diagnosis not present

## 2015-03-30 DIAGNOSIS — Z8673 Personal history of transient ischemic attack (TIA), and cerebral infarction without residual deficits: Secondary | ICD-10-CM | POA: Diagnosis not present

## 2015-03-30 DIAGNOSIS — G459 Transient cerebral ischemic attack, unspecified: Secondary | ICD-10-CM | POA: Diagnosis not present

## 2015-03-30 DIAGNOSIS — J449 Chronic obstructive pulmonary disease, unspecified: Secondary | ICD-10-CM | POA: Diagnosis not present

## 2015-03-30 DIAGNOSIS — E876 Hypokalemia: Secondary | ICD-10-CM | POA: Diagnosis not present

## 2015-03-30 DIAGNOSIS — R072 Precordial pain: Secondary | ICD-10-CM | POA: Diagnosis not present

## 2015-03-30 DIAGNOSIS — Z6833 Body mass index (BMI) 33.0-33.9, adult: Secondary | ICD-10-CM | POA: Diagnosis not present

## 2015-03-30 DIAGNOSIS — R079 Chest pain, unspecified: Secondary | ICD-10-CM | POA: Diagnosis not present

## 2015-03-30 DIAGNOSIS — F3181 Bipolar II disorder: Secondary | ICD-10-CM | POA: Diagnosis not present

## 2015-03-30 DIAGNOSIS — I69354 Hemiplegia and hemiparesis following cerebral infarction affecting left non-dominant side: Secondary | ICD-10-CM | POA: Diagnosis not present

## 2015-03-30 DIAGNOSIS — J441 Chronic obstructive pulmonary disease with (acute) exacerbation: Secondary | ICD-10-CM | POA: Diagnosis not present

## 2015-03-30 DIAGNOSIS — I160 Hypertensive urgency: Secondary | ICD-10-CM | POA: Diagnosis not present

## 2015-03-30 DIAGNOSIS — I161 Hypertensive emergency: Secondary | ICD-10-CM | POA: Diagnosis not present

## 2015-03-30 DIAGNOSIS — I152 Hypertension secondary to endocrine disorders: Secondary | ICD-10-CM | POA: Diagnosis not present

## 2015-03-30 DIAGNOSIS — G2581 Restless legs syndrome: Secondary | ICD-10-CM | POA: Diagnosis not present

## 2015-04-07 DIAGNOSIS — I509 Heart failure, unspecified: Secondary | ICD-10-CM | POA: Diagnosis present

## 2015-04-07 DIAGNOSIS — I161 Hypertensive emergency: Secondary | ICD-10-CM | POA: Diagnosis not present

## 2015-04-07 DIAGNOSIS — G4700 Insomnia, unspecified: Secondary | ICD-10-CM | POA: Diagnosis present

## 2015-04-07 DIAGNOSIS — M6281 Muscle weakness (generalized): Secondary | ICD-10-CM | POA: Diagnosis not present

## 2015-04-07 DIAGNOSIS — R531 Weakness: Secondary | ICD-10-CM | POA: Diagnosis not present

## 2015-04-07 DIAGNOSIS — R911 Solitary pulmonary nodule: Secondary | ICD-10-CM | POA: Diagnosis not present

## 2015-04-07 DIAGNOSIS — Z7401 Bed confinement status: Secondary | ICD-10-CM | POA: Diagnosis not present

## 2015-04-07 DIAGNOSIS — I10 Essential (primary) hypertension: Secondary | ICD-10-CM | POA: Diagnosis not present

## 2015-04-07 DIAGNOSIS — R29810 Facial weakness: Secondary | ICD-10-CM | POA: Diagnosis present

## 2015-04-07 DIAGNOSIS — M79604 Pain in right leg: Secondary | ICD-10-CM | POA: Diagnosis not present

## 2015-04-07 DIAGNOSIS — J449 Chronic obstructive pulmonary disease, unspecified: Secondary | ICD-10-CM | POA: Diagnosis present

## 2015-04-07 DIAGNOSIS — F3181 Bipolar II disorder: Secondary | ICD-10-CM | POA: Diagnosis present

## 2015-04-07 DIAGNOSIS — R1312 Dysphagia, oropharyngeal phase: Secondary | ICD-10-CM | POA: Diagnosis not present

## 2015-04-07 DIAGNOSIS — M79661 Pain in right lower leg: Secondary | ICD-10-CM | POA: Diagnosis not present

## 2015-04-07 DIAGNOSIS — Z720 Tobacco use: Secondary | ICD-10-CM | POA: Diagnosis not present

## 2015-04-07 DIAGNOSIS — I639 Cerebral infarction, unspecified: Secondary | ICD-10-CM | POA: Diagnosis not present

## 2015-04-07 DIAGNOSIS — G2581 Restless legs syndrome: Secondary | ICD-10-CM | POA: Diagnosis present

## 2015-04-07 DIAGNOSIS — Z8673 Personal history of transient ischemic attack (TIA), and cerebral infarction without residual deficits: Secondary | ICD-10-CM | POA: Diagnosis not present

## 2015-04-07 DIAGNOSIS — I69354 Hemiplegia and hemiparesis following cerebral infarction affecting left non-dominant side: Secondary | ICD-10-CM | POA: Diagnosis not present

## 2015-04-07 DIAGNOSIS — R9082 White matter disease, unspecified: Secondary | ICD-10-CM | POA: Diagnosis not present

## 2015-04-07 DIAGNOSIS — I110 Hypertensive heart disease with heart failure: Secondary | ICD-10-CM | POA: Diagnosis present

## 2015-04-07 DIAGNOSIS — E119 Type 2 diabetes mellitus without complications: Secondary | ICD-10-CM | POA: Diagnosis present

## 2015-04-11 DIAGNOSIS — G4730 Sleep apnea, unspecified: Secondary | ICD-10-CM | POA: Diagnosis not present

## 2015-04-11 DIAGNOSIS — Z720 Tobacco use: Secondary | ICD-10-CM | POA: Diagnosis not present

## 2015-04-11 DIAGNOSIS — I69354 Hemiplegia and hemiparesis following cerebral infarction affecting left non-dominant side: Secondary | ICD-10-CM | POA: Diagnosis not present

## 2015-04-11 DIAGNOSIS — Z9181 History of falling: Secondary | ICD-10-CM | POA: Diagnosis not present

## 2015-04-11 DIAGNOSIS — E119 Type 2 diabetes mellitus without complications: Secondary | ICD-10-CM | POA: Diagnosis not present

## 2015-04-11 DIAGNOSIS — I509 Heart failure, unspecified: Secondary | ICD-10-CM | POA: Diagnosis not present

## 2015-04-11 DIAGNOSIS — J449 Chronic obstructive pulmonary disease, unspecified: Secondary | ICD-10-CM | POA: Diagnosis not present

## 2015-04-11 DIAGNOSIS — I110 Hypertensive heart disease with heart failure: Secondary | ICD-10-CM | POA: Diagnosis not present

## 2015-04-14 DIAGNOSIS — I10 Essential (primary) hypertension: Secondary | ICD-10-CM | POA: Diagnosis not present

## 2015-04-15 DIAGNOSIS — J449 Chronic obstructive pulmonary disease, unspecified: Secondary | ICD-10-CM | POA: Diagnosis not present

## 2015-04-15 DIAGNOSIS — E119 Type 2 diabetes mellitus without complications: Secondary | ICD-10-CM | POA: Diagnosis not present

## 2015-04-15 DIAGNOSIS — I69354 Hemiplegia and hemiparesis following cerebral infarction affecting left non-dominant side: Secondary | ICD-10-CM | POA: Diagnosis not present

## 2015-04-15 DIAGNOSIS — I509 Heart failure, unspecified: Secondary | ICD-10-CM | POA: Diagnosis not present

## 2015-04-15 DIAGNOSIS — G4730 Sleep apnea, unspecified: Secondary | ICD-10-CM | POA: Diagnosis not present

## 2015-04-15 DIAGNOSIS — I110 Hypertensive heart disease with heart failure: Secondary | ICD-10-CM | POA: Diagnosis not present

## 2015-04-16 DIAGNOSIS — I639 Cerebral infarction, unspecified: Secondary | ICD-10-CM | POA: Diagnosis not present

## 2015-04-16 DIAGNOSIS — E119 Type 2 diabetes mellitus without complications: Secondary | ICD-10-CM | POA: Diagnosis not present

## 2015-04-16 DIAGNOSIS — G8190 Hemiplegia, unspecified affecting unspecified side: Secondary | ICD-10-CM | POA: Diagnosis not present

## 2015-04-16 DIAGNOSIS — I10 Essential (primary) hypertension: Secondary | ICD-10-CM | POA: Diagnosis not present

## 2015-04-17 DIAGNOSIS — I110 Hypertensive heart disease with heart failure: Secondary | ICD-10-CM | POA: Diagnosis not present

## 2015-04-17 DIAGNOSIS — I509 Heart failure, unspecified: Secondary | ICD-10-CM | POA: Diagnosis not present

## 2015-04-17 DIAGNOSIS — J449 Chronic obstructive pulmonary disease, unspecified: Secondary | ICD-10-CM | POA: Diagnosis not present

## 2015-04-17 DIAGNOSIS — I69354 Hemiplegia and hemiparesis following cerebral infarction affecting left non-dominant side: Secondary | ICD-10-CM | POA: Diagnosis not present

## 2015-04-17 DIAGNOSIS — G4730 Sleep apnea, unspecified: Secondary | ICD-10-CM | POA: Diagnosis not present

## 2015-04-17 DIAGNOSIS — E119 Type 2 diabetes mellitus without complications: Secondary | ICD-10-CM | POA: Diagnosis not present

## 2015-04-21 DIAGNOSIS — I110 Hypertensive heart disease with heart failure: Secondary | ICD-10-CM | POA: Diagnosis not present

## 2015-04-21 DIAGNOSIS — I69354 Hemiplegia and hemiparesis following cerebral infarction affecting left non-dominant side: Secondary | ICD-10-CM | POA: Diagnosis not present

## 2015-04-21 DIAGNOSIS — E119 Type 2 diabetes mellitus without complications: Secondary | ICD-10-CM | POA: Diagnosis not present

## 2015-04-21 DIAGNOSIS — I509 Heart failure, unspecified: Secondary | ICD-10-CM | POA: Diagnosis not present

## 2015-04-21 DIAGNOSIS — G4730 Sleep apnea, unspecified: Secondary | ICD-10-CM | POA: Diagnosis not present

## 2015-04-21 DIAGNOSIS — J449 Chronic obstructive pulmonary disease, unspecified: Secondary | ICD-10-CM | POA: Diagnosis not present

## 2015-04-22 DIAGNOSIS — E119 Type 2 diabetes mellitus without complications: Secondary | ICD-10-CM | POA: Diagnosis not present

## 2015-04-22 DIAGNOSIS — I69354 Hemiplegia and hemiparesis following cerebral infarction affecting left non-dominant side: Secondary | ICD-10-CM | POA: Diagnosis not present

## 2015-04-22 DIAGNOSIS — G4730 Sleep apnea, unspecified: Secondary | ICD-10-CM | POA: Diagnosis not present

## 2015-04-22 DIAGNOSIS — J449 Chronic obstructive pulmonary disease, unspecified: Secondary | ICD-10-CM | POA: Diagnosis not present

## 2015-04-22 DIAGNOSIS — I110 Hypertensive heart disease with heart failure: Secondary | ICD-10-CM | POA: Diagnosis not present

## 2015-04-22 DIAGNOSIS — I509 Heart failure, unspecified: Secondary | ICD-10-CM | POA: Diagnosis not present

## 2015-04-23 DIAGNOSIS — I509 Heart failure, unspecified: Secondary | ICD-10-CM | POA: Diagnosis not present

## 2015-04-23 DIAGNOSIS — I110 Hypertensive heart disease with heart failure: Secondary | ICD-10-CM | POA: Diagnosis not present

## 2015-04-23 DIAGNOSIS — J449 Chronic obstructive pulmonary disease, unspecified: Secondary | ICD-10-CM | POA: Diagnosis not present

## 2015-04-23 DIAGNOSIS — I69354 Hemiplegia and hemiparesis following cerebral infarction affecting left non-dominant side: Secondary | ICD-10-CM | POA: Diagnosis not present

## 2015-04-23 DIAGNOSIS — G4730 Sleep apnea, unspecified: Secondary | ICD-10-CM | POA: Diagnosis not present

## 2015-04-23 DIAGNOSIS — E119 Type 2 diabetes mellitus without complications: Secondary | ICD-10-CM | POA: Diagnosis not present

## 2015-04-28 DIAGNOSIS — G4730 Sleep apnea, unspecified: Secondary | ICD-10-CM | POA: Diagnosis not present

## 2015-04-28 DIAGNOSIS — I509 Heart failure, unspecified: Secondary | ICD-10-CM | POA: Diagnosis not present

## 2015-04-28 DIAGNOSIS — J449 Chronic obstructive pulmonary disease, unspecified: Secondary | ICD-10-CM | POA: Diagnosis not present

## 2015-04-28 DIAGNOSIS — I110 Hypertensive heart disease with heart failure: Secondary | ICD-10-CM | POA: Diagnosis not present

## 2015-04-28 DIAGNOSIS — E119 Type 2 diabetes mellitus without complications: Secondary | ICD-10-CM | POA: Diagnosis not present

## 2015-04-28 DIAGNOSIS — I69354 Hemiplegia and hemiparesis following cerebral infarction affecting left non-dominant side: Secondary | ICD-10-CM | POA: Diagnosis not present

## 2015-04-30 ENCOUNTER — Ambulatory Visit
Admission: RE | Admit: 2015-04-30 | Discharge: 2015-04-30 | Disposition: A | Discharge status: Home / Self Care | Payer: Medicare Other | Source: Ambulatory Visit | Attending: Internal Medicine | Admitting: Internal Medicine

## 2015-04-30 DIAGNOSIS — Z1231 Encounter for screening mammogram for malignant neoplasm of breast: Secondary | ICD-10-CM

## 2015-05-05 DIAGNOSIS — I110 Hypertensive heart disease with heart failure: Secondary | ICD-10-CM | POA: Diagnosis not present

## 2015-05-05 DIAGNOSIS — R209 Unspecified disturbances of skin sensation: Secondary | ICD-10-CM | POA: Diagnosis not present

## 2015-05-05 DIAGNOSIS — I509 Heart failure, unspecified: Secondary | ICD-10-CM | POA: Diagnosis not present

## 2015-05-05 DIAGNOSIS — I639 Cerebral infarction, unspecified: Secondary | ICD-10-CM | POA: Diagnosis not present

## 2015-05-05 DIAGNOSIS — I10 Essential (primary) hypertension: Secondary | ICD-10-CM | POA: Diagnosis not present

## 2015-05-05 DIAGNOSIS — E119 Type 2 diabetes mellitus without complications: Secondary | ICD-10-CM | POA: Diagnosis not present

## 2015-05-05 DIAGNOSIS — G4730 Sleep apnea, unspecified: Secondary | ICD-10-CM | POA: Diagnosis not present

## 2015-05-05 DIAGNOSIS — R251 Tremor, unspecified: Secondary | ICD-10-CM | POA: Diagnosis not present

## 2015-05-05 DIAGNOSIS — G4459 Other complicated headache syndrome: Secondary | ICD-10-CM | POA: Diagnosis not present

## 2015-05-05 DIAGNOSIS — J449 Chronic obstructive pulmonary disease, unspecified: Secondary | ICD-10-CM | POA: Diagnosis not present

## 2015-05-05 DIAGNOSIS — I69354 Hemiplegia and hemiparesis following cerebral infarction affecting left non-dominant side: Secondary | ICD-10-CM | POA: Diagnosis not present

## 2015-05-07 DIAGNOSIS — J449 Chronic obstructive pulmonary disease, unspecified: Secondary | ICD-10-CM | POA: Diagnosis not present

## 2015-05-07 DIAGNOSIS — G4730 Sleep apnea, unspecified: Secondary | ICD-10-CM | POA: Diagnosis not present

## 2015-05-07 DIAGNOSIS — I509 Heart failure, unspecified: Secondary | ICD-10-CM | POA: Diagnosis not present

## 2015-05-07 DIAGNOSIS — I69354 Hemiplegia and hemiparesis following cerebral infarction affecting left non-dominant side: Secondary | ICD-10-CM | POA: Diagnosis not present

## 2015-05-07 DIAGNOSIS — I110 Hypertensive heart disease with heart failure: Secondary | ICD-10-CM | POA: Diagnosis not present

## 2015-05-07 DIAGNOSIS — E119 Type 2 diabetes mellitus without complications: Secondary | ICD-10-CM | POA: Diagnosis not present

## 2015-05-13 DIAGNOSIS — I110 Hypertensive heart disease with heart failure: Secondary | ICD-10-CM | POA: Diagnosis not present

## 2015-05-13 DIAGNOSIS — I509 Heart failure, unspecified: Secondary | ICD-10-CM | POA: Diagnosis not present

## 2015-05-13 DIAGNOSIS — J449 Chronic obstructive pulmonary disease, unspecified: Secondary | ICD-10-CM | POA: Diagnosis not present

## 2015-05-13 DIAGNOSIS — G4730 Sleep apnea, unspecified: Secondary | ICD-10-CM | POA: Diagnosis not present

## 2015-05-13 DIAGNOSIS — I69354 Hemiplegia and hemiparesis following cerebral infarction affecting left non-dominant side: Secondary | ICD-10-CM | POA: Diagnosis not present

## 2015-05-13 DIAGNOSIS — E119 Type 2 diabetes mellitus without complications: Secondary | ICD-10-CM | POA: Diagnosis not present

## 2015-05-14 DIAGNOSIS — I509 Heart failure, unspecified: Secondary | ICD-10-CM | POA: Diagnosis not present

## 2015-05-14 DIAGNOSIS — J449 Chronic obstructive pulmonary disease, unspecified: Secondary | ICD-10-CM | POA: Diagnosis not present

## 2015-05-14 DIAGNOSIS — E119 Type 2 diabetes mellitus without complications: Secondary | ICD-10-CM | POA: Diagnosis not present

## 2015-05-14 DIAGNOSIS — G4730 Sleep apnea, unspecified: Secondary | ICD-10-CM | POA: Diagnosis not present

## 2015-05-14 DIAGNOSIS — I110 Hypertensive heart disease with heart failure: Secondary | ICD-10-CM | POA: Diagnosis not present

## 2015-05-14 DIAGNOSIS — I69354 Hemiplegia and hemiparesis following cerebral infarction affecting left non-dominant side: Secondary | ICD-10-CM | POA: Diagnosis not present

## 2015-05-15 DIAGNOSIS — E119 Type 2 diabetes mellitus without complications: Secondary | ICD-10-CM | POA: Diagnosis not present

## 2015-05-15 DIAGNOSIS — I509 Heart failure, unspecified: Secondary | ICD-10-CM | POA: Diagnosis not present

## 2015-05-15 DIAGNOSIS — I69354 Hemiplegia and hemiparesis following cerebral infarction affecting left non-dominant side: Secondary | ICD-10-CM | POA: Diagnosis not present

## 2015-05-15 DIAGNOSIS — J449 Chronic obstructive pulmonary disease, unspecified: Secondary | ICD-10-CM | POA: Diagnosis not present

## 2015-05-15 DIAGNOSIS — G4730 Sleep apnea, unspecified: Secondary | ICD-10-CM | POA: Diagnosis not present

## 2015-05-15 DIAGNOSIS — I110 Hypertensive heart disease with heart failure: Secondary | ICD-10-CM | POA: Diagnosis not present

## 2015-05-16 DIAGNOSIS — E119 Type 2 diabetes mellitus without complications: Secondary | ICD-10-CM | POA: Diagnosis not present

## 2015-05-16 DIAGNOSIS — R29810 Facial weakness: Secondary | ICD-10-CM | POA: Diagnosis not present

## 2015-05-16 DIAGNOSIS — R51 Headache: Secondary | ICD-10-CM | POA: Diagnosis not present

## 2015-05-16 DIAGNOSIS — Z8673 Personal history of transient ischemic attack (TIA), and cerebral infarction without residual deficits: Secondary | ICD-10-CM | POA: Diagnosis not present

## 2015-05-16 DIAGNOSIS — M6281 Muscle weakness (generalized): Secondary | ICD-10-CM | POA: Diagnosis not present

## 2015-05-16 DIAGNOSIS — R200 Anesthesia of skin: Secondary | ICD-10-CM | POA: Diagnosis not present

## 2015-05-16 DIAGNOSIS — I10 Essential (primary) hypertension: Secondary | ICD-10-CM | POA: Diagnosis not present

## 2015-05-16 DIAGNOSIS — R918 Other nonspecific abnormal finding of lung field: Secondary | ICD-10-CM | POA: Diagnosis not present

## 2015-05-16 DIAGNOSIS — R2689 Other abnormalities of gait and mobility: Secondary | ICD-10-CM | POA: Diagnosis not present

## 2015-05-16 DIAGNOSIS — E785 Hyperlipidemia, unspecified: Secondary | ICD-10-CM | POA: Diagnosis not present

## 2015-05-16 DIAGNOSIS — I6930 Unspecified sequelae of cerebral infarction: Secondary | ICD-10-CM | POA: Diagnosis not present

## 2015-05-16 DIAGNOSIS — R079 Chest pain, unspecified: Secondary | ICD-10-CM | POA: Diagnosis not present

## 2015-05-17 DIAGNOSIS — Z8673 Personal history of transient ischemic attack (TIA), and cerebral infarction without residual deficits: Secondary | ICD-10-CM | POA: Diagnosis not present

## 2015-05-17 DIAGNOSIS — H35039 Hypertensive retinopathy, unspecified eye: Secondary | ICD-10-CM | POA: Diagnosis present

## 2015-05-17 DIAGNOSIS — I161 Hypertensive emergency: Secondary | ICD-10-CM | POA: Diagnosis not present

## 2015-05-17 DIAGNOSIS — F419 Anxiety disorder, unspecified: Secondary | ICD-10-CM | POA: Diagnosis present

## 2015-05-17 DIAGNOSIS — I110 Hypertensive heart disease with heart failure: Secondary | ICD-10-CM | POA: Diagnosis present

## 2015-05-17 DIAGNOSIS — R29810 Facial weakness: Secondary | ICD-10-CM | POA: Diagnosis present

## 2015-05-17 DIAGNOSIS — I69349 Monoplegia of lower limb following cerebral infarction affecting unspecified side: Secondary | ICD-10-CM | POA: Diagnosis not present

## 2015-05-17 DIAGNOSIS — I252 Old myocardial infarction: Secondary | ICD-10-CM | POA: Diagnosis not present

## 2015-05-17 DIAGNOSIS — I169 Hypertensive crisis, unspecified: Secondary | ICD-10-CM | POA: Diagnosis present

## 2015-05-17 DIAGNOSIS — H409 Unspecified glaucoma: Secondary | ICD-10-CM | POA: Diagnosis present

## 2015-05-17 DIAGNOSIS — R2689 Other abnormalities of gait and mobility: Secondary | ICD-10-CM | POA: Diagnosis not present

## 2015-05-17 DIAGNOSIS — H547 Unspecified visual loss: Secondary | ICD-10-CM | POA: Diagnosis present

## 2015-05-17 DIAGNOSIS — E119 Type 2 diabetes mellitus without complications: Secondary | ICD-10-CM | POA: Diagnosis present

## 2015-05-17 DIAGNOSIS — I160 Hypertensive urgency: Secondary | ICD-10-CM | POA: Diagnosis not present

## 2015-05-17 DIAGNOSIS — E785 Hyperlipidemia, unspecified: Secondary | ICD-10-CM | POA: Diagnosis not present

## 2015-05-17 DIAGNOSIS — H348192 Central retinal vein occlusion, unspecified eye, stable: Secondary | ICD-10-CM | POA: Diagnosis not present

## 2015-05-17 DIAGNOSIS — I69320 Aphasia following cerebral infarction: Secondary | ICD-10-CM | POA: Diagnosis not present

## 2015-05-17 DIAGNOSIS — M6281 Muscle weakness (generalized): Secondary | ICD-10-CM | POA: Diagnosis not present

## 2015-05-17 DIAGNOSIS — R51 Headache: Secondary | ICD-10-CM | POA: Diagnosis not present

## 2015-05-17 DIAGNOSIS — I509 Heart failure, unspecified: Secondary | ICD-10-CM | POA: Diagnosis present

## 2015-05-17 DIAGNOSIS — H269 Unspecified cataract: Secondary | ICD-10-CM | POA: Diagnosis present

## 2015-05-17 DIAGNOSIS — I739 Peripheral vascular disease, unspecified: Secondary | ICD-10-CM | POA: Diagnosis present

## 2015-05-17 DIAGNOSIS — H35033 Hypertensive retinopathy, bilateral: Secondary | ICD-10-CM | POA: Diagnosis not present

## 2015-05-17 DIAGNOSIS — R918 Other nonspecific abnormal finding of lung field: Secondary | ICD-10-CM | POA: Diagnosis not present

## 2015-05-17 DIAGNOSIS — F329 Major depressive disorder, single episode, unspecified: Secondary | ICD-10-CM | POA: Diagnosis present

## 2015-05-17 DIAGNOSIS — G2581 Restless legs syndrome: Secondary | ICD-10-CM | POA: Diagnosis present

## 2015-05-17 DIAGNOSIS — I6930 Unspecified sequelae of cerebral infarction: Secondary | ICD-10-CM | POA: Diagnosis not present

## 2015-05-17 DIAGNOSIS — F40240 Claustrophobia: Secondary | ICD-10-CM | POA: Diagnosis present

## 2015-05-17 DIAGNOSIS — I69354 Hemiplegia and hemiparesis following cerebral infarction affecting left non-dominant side: Secondary | ICD-10-CM | POA: Diagnosis not present

## 2015-05-17 DIAGNOSIS — R200 Anesthesia of skin: Secondary | ICD-10-CM | POA: Diagnosis not present

## 2015-05-17 DIAGNOSIS — H5462 Unqualified visual loss, left eye, normal vision right eye: Secondary | ICD-10-CM | POA: Diagnosis not present

## 2015-05-17 DIAGNOSIS — J449 Chronic obstructive pulmonary disease, unspecified: Secondary | ICD-10-CM | POA: Diagnosis not present

## 2015-05-17 DIAGNOSIS — R079 Chest pain, unspecified: Secondary | ICD-10-CM | POA: Diagnosis not present

## 2015-05-17 DIAGNOSIS — F17210 Nicotine dependence, cigarettes, uncomplicated: Secondary | ICD-10-CM | POA: Diagnosis present

## 2015-05-17 DIAGNOSIS — H538 Other visual disturbances: Secondary | ICD-10-CM | POA: Diagnosis not present

## 2015-05-17 DIAGNOSIS — I10 Essential (primary) hypertension: Secondary | ICD-10-CM | POA: Diagnosis not present

## 2015-05-17 DIAGNOSIS — G4730 Sleep apnea, unspecified: Secondary | ICD-10-CM | POA: Diagnosis present

## 2015-05-17 DIAGNOSIS — I69398 Other sequelae of cerebral infarction: Secondary | ICD-10-CM | POA: Diagnosis not present

## 2015-05-17 DIAGNOSIS — H5340 Unspecified visual field defects: Secondary | ICD-10-CM | POA: Diagnosis present

## 2015-05-27 DIAGNOSIS — G2581 Restless legs syndrome: Secondary | ICD-10-CM | POA: Diagnosis not present

## 2015-05-27 DIAGNOSIS — G4459 Other complicated headache syndrome: Secondary | ICD-10-CM | POA: Diagnosis not present

## 2015-05-27 DIAGNOSIS — J449 Chronic obstructive pulmonary disease, unspecified: Secondary | ICD-10-CM | POA: Diagnosis not present

## 2015-05-27 DIAGNOSIS — R5383 Other fatigue: Secondary | ICD-10-CM | POA: Diagnosis not present

## 2015-05-27 DIAGNOSIS — I10 Essential (primary) hypertension: Secondary | ICD-10-CM | POA: Diagnosis not present

## 2015-05-27 DIAGNOSIS — M545 Low back pain: Secondary | ICD-10-CM | POA: Diagnosis not present

## 2015-05-27 DIAGNOSIS — G4701 Insomnia due to medical condition: Secondary | ICD-10-CM | POA: Diagnosis not present

## 2015-05-27 DIAGNOSIS — E119 Type 2 diabetes mellitus without complications: Secondary | ICD-10-CM | POA: Diagnosis not present

## 2015-05-27 DIAGNOSIS — E6609 Other obesity due to excess calories: Secondary | ICD-10-CM | POA: Diagnosis not present

## 2015-05-27 DIAGNOSIS — R2681 Unsteadiness on feet: Secondary | ICD-10-CM | POA: Diagnosis not present

## 2015-05-27 DIAGNOSIS — R251 Tremor, unspecified: Secondary | ICD-10-CM | POA: Diagnosis not present

## 2015-05-27 IMAGING — CR DG CHEST 1V PORT
1 series · 1 of 1 positions shown · non-contrast
Comparison: 04/02/2012

CLINICAL DATA: Chest pain and shortness of breath for 2 days.

EXAM:
PORTABLE CHEST - 1 VIEW

[AP]
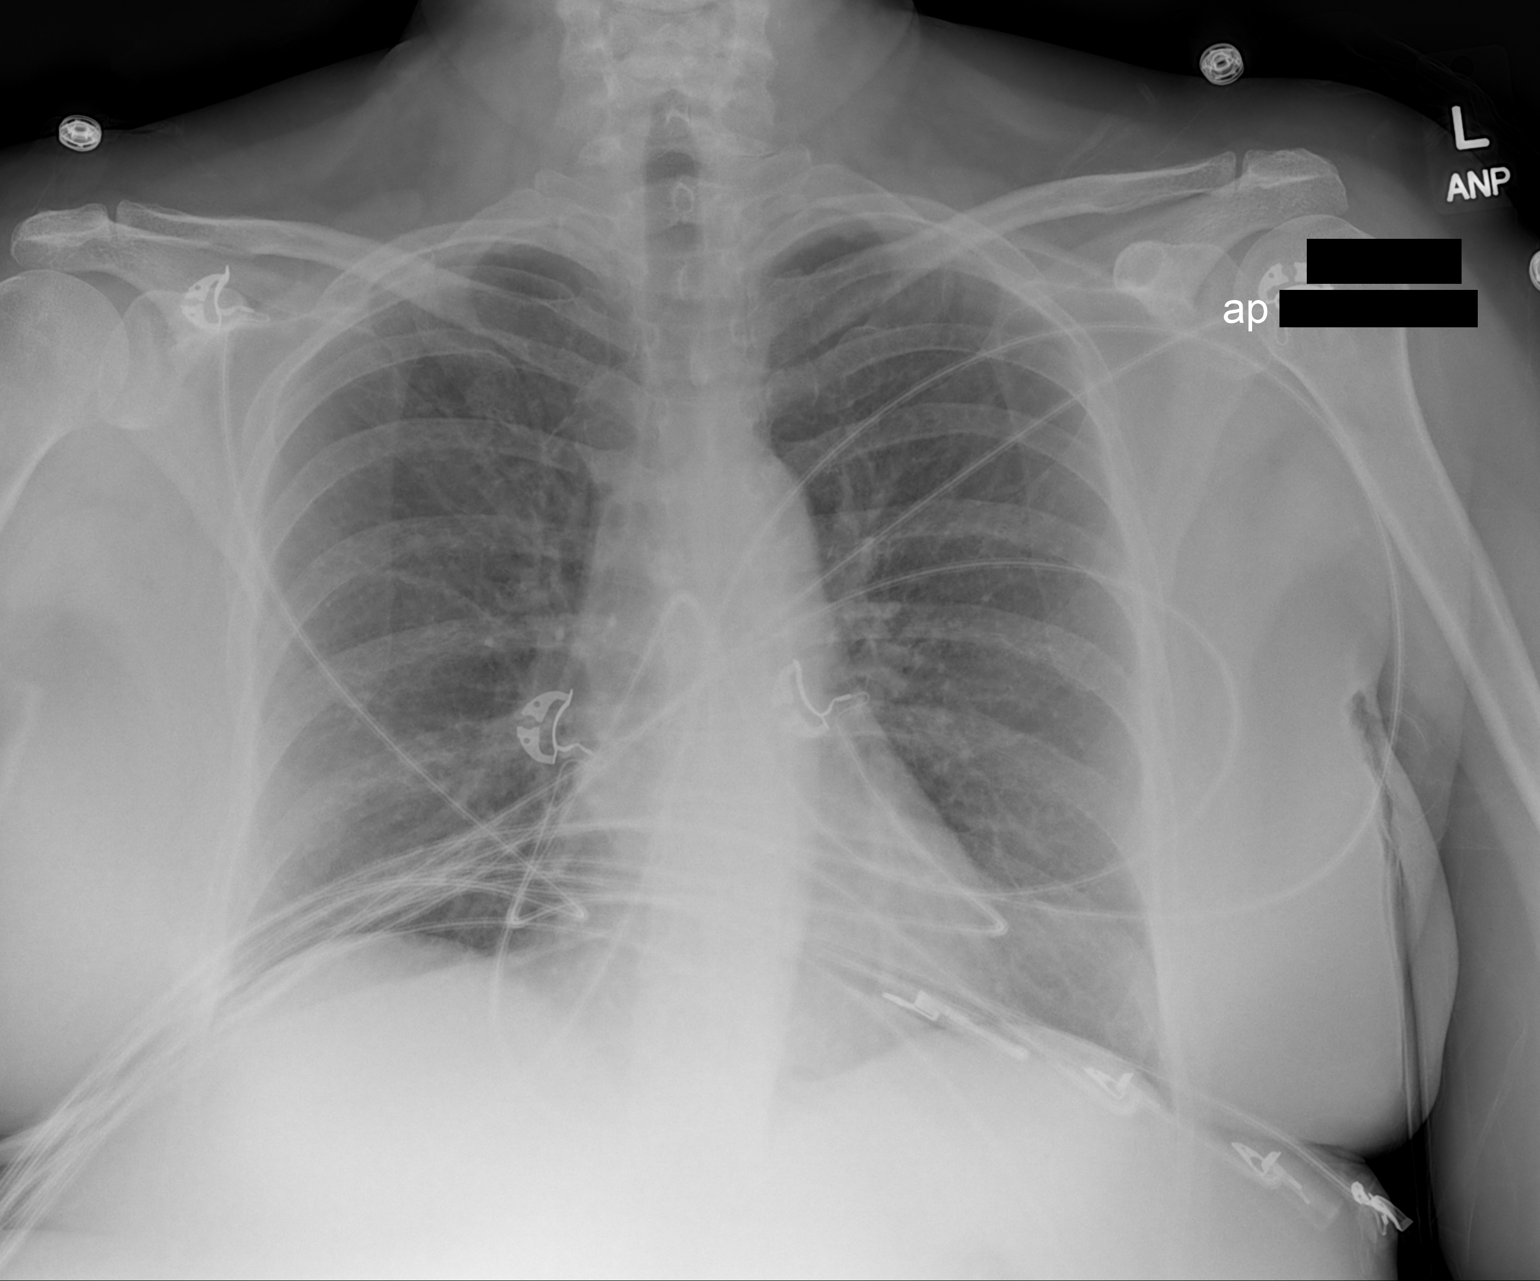

[1 of 1 positions shown; findings below may reference images not displayed]

FINDINGS: The heart size and mediastinal contours are within normal limits.
Both lungs are clear. The visualized skeletal structures are
unremarkable.
IMPRESSION: No active disease.

## 2015-06-08 DIAGNOSIS — Z8614 Personal history of Methicillin resistant Staphylococcus aureus infection: Secondary | ICD-10-CM

## 2015-06-08 HISTORY — DX: Personal history of Methicillin resistant Staphylococcus aureus infection: Z86.14

## 2015-06-12 DIAGNOSIS — H35033 Hypertensive retinopathy, bilateral: Secondary | ICD-10-CM | POA: Diagnosis not present

## 2015-06-12 DIAGNOSIS — H53451 Other localized visual field defect, right eye: Secondary | ICD-10-CM | POA: Diagnosis not present

## 2015-06-13 ENCOUNTER — Emergency Department (HOSPITAL_COMMUNITY)
Admission: EM | Admit: 2015-06-13 | Discharge: 2015-06-13 | Disposition: A | Discharge status: Home / Self Care | Payer: Medicare Other | Attending: Emergency Medicine | Admitting: Emergency Medicine

## 2015-06-13 ENCOUNTER — Encounter (HOSPITAL_COMMUNITY): Payer: Self-pay | Admitting: Emergency Medicine

## 2015-06-13 ENCOUNTER — Emergency Department (HOSPITAL_COMMUNITY): Payer: Medicare Other

## 2015-06-13 DIAGNOSIS — Y998 Other external cause status: Secondary | ICD-10-CM | POA: Diagnosis not present

## 2015-06-13 DIAGNOSIS — Y9289 Other specified places as the place of occurrence of the external cause: Secondary | ICD-10-CM | POA: Insufficient documentation

## 2015-06-13 DIAGNOSIS — J449 Chronic obstructive pulmonary disease, unspecified: Secondary | ICD-10-CM | POA: Insufficient documentation

## 2015-06-13 DIAGNOSIS — S3992XA Unspecified injury of lower back, initial encounter: Secondary | ICD-10-CM | POA: Diagnosis not present

## 2015-06-13 DIAGNOSIS — I10 Essential (primary) hypertension: Secondary | ICD-10-CM | POA: Diagnosis not present

## 2015-06-13 DIAGNOSIS — F319 Bipolar disorder, unspecified: Secondary | ICD-10-CM | POA: Diagnosis not present

## 2015-06-13 DIAGNOSIS — S0990XA Unspecified injury of head, initial encounter: Secondary | ICD-10-CM | POA: Insufficient documentation

## 2015-06-13 DIAGNOSIS — S8991XA Unspecified injury of right lower leg, initial encounter: Secondary | ICD-10-CM | POA: Diagnosis not present

## 2015-06-13 DIAGNOSIS — W108XXA Fall (on) (from) other stairs and steps, initial encounter: Secondary | ICD-10-CM | POA: Diagnosis not present

## 2015-06-13 DIAGNOSIS — I2510 Atherosclerotic heart disease of native coronary artery without angina pectoris: Secondary | ICD-10-CM | POA: Diagnosis not present

## 2015-06-13 DIAGNOSIS — F17210 Nicotine dependence, cigarettes, uncomplicated: Secondary | ICD-10-CM | POA: Diagnosis not present

## 2015-06-13 DIAGNOSIS — M25561 Pain in right knee: Secondary | ICD-10-CM | POA: Diagnosis not present

## 2015-06-13 DIAGNOSIS — S9001XA Contusion of right ankle, initial encounter: Secondary | ICD-10-CM | POA: Diagnosis not present

## 2015-06-13 DIAGNOSIS — Z8673 Personal history of transient ischemic attack (TIA), and cerebral infarction without residual deficits: Secondary | ICD-10-CM | POA: Diagnosis not present

## 2015-06-13 DIAGNOSIS — Z79899 Other long term (current) drug therapy: Secondary | ICD-10-CM | POA: Diagnosis not present

## 2015-06-13 DIAGNOSIS — E119 Type 2 diabetes mellitus without complications: Secondary | ICD-10-CM | POA: Insufficient documentation

## 2015-06-13 DIAGNOSIS — S40011A Contusion of right shoulder, initial encounter: Secondary | ICD-10-CM | POA: Diagnosis not present

## 2015-06-13 DIAGNOSIS — S8001XA Contusion of right knee, initial encounter: Secondary | ICD-10-CM | POA: Diagnosis not present

## 2015-06-13 DIAGNOSIS — Y9389 Activity, other specified: Secondary | ICD-10-CM | POA: Insufficient documentation

## 2015-06-13 DIAGNOSIS — Z880 Allergy status to penicillin: Secondary | ICD-10-CM | POA: Insufficient documentation

## 2015-06-13 DIAGNOSIS — M25571 Pain in right ankle and joints of right foot: Secondary | ICD-10-CM | POA: Diagnosis not present

## 2015-06-13 DIAGNOSIS — M79671 Pain in right foot: Secondary | ICD-10-CM | POA: Diagnosis not present

## 2015-06-13 DIAGNOSIS — Z9889 Other specified postprocedural states: Secondary | ICD-10-CM | POA: Diagnosis not present

## 2015-06-13 DIAGNOSIS — S6991XA Unspecified injury of right wrist, hand and finger(s), initial encounter: Secondary | ICD-10-CM | POA: Diagnosis not present

## 2015-06-13 DIAGNOSIS — S4991XA Unspecified injury of right shoulder and upper arm, initial encounter: Secondary | ICD-10-CM | POA: Diagnosis not present

## 2015-06-13 DIAGNOSIS — M25511 Pain in right shoulder: Secondary | ICD-10-CM | POA: Diagnosis not present

## 2015-06-13 DIAGNOSIS — K219 Gastro-esophageal reflux disease without esophagitis: Secondary | ICD-10-CM | POA: Insufficient documentation

## 2015-06-13 DIAGNOSIS — T07XXXA Unspecified multiple injuries, initial encounter: Secondary | ICD-10-CM

## 2015-06-13 DIAGNOSIS — S300XXA Contusion of lower back and pelvis, initial encounter: Secondary | ICD-10-CM | POA: Diagnosis not present

## 2015-06-13 DIAGNOSIS — W109XXA Fall (on) (from) unspecified stairs and steps, initial encounter: Secondary | ICD-10-CM | POA: Insufficient documentation

## 2015-06-13 DIAGNOSIS — S99921A Unspecified injury of right foot, initial encounter: Secondary | ICD-10-CM | POA: Diagnosis not present

## 2015-06-13 DIAGNOSIS — T148 Other injury of unspecified body region: Secondary | ICD-10-CM | POA: Diagnosis not present

## 2015-06-13 DIAGNOSIS — M549 Dorsalgia, unspecified: Secondary | ICD-10-CM | POA: Diagnosis not present

## 2015-06-13 MED ORDER — ONDANSETRON 8 MG PO TBDP
8.0000 mg | ORAL_TABLET | Freq: Once | ORAL | Status: AC
  Administered 2015-06-13: 8 mg via ORAL
  Filled 2015-06-13: qty 1

## 2015-06-13 MED ORDER — OXYCODONE-ACETAMINOPHEN 5-325 MG PO TABS
1.0000 | ORAL_TABLET | Freq: Once | ORAL | Status: AC
  Administered 2015-06-13: 1 via ORAL
  Filled 2015-06-13: qty 1

## 2015-06-13 MED ORDER — IBUPROFEN 400 MG PO TABS
600.0000 mg | ORAL_TABLET | Freq: Once | ORAL | Status: AC
  Administered 2015-06-13: 600 mg via ORAL
  Filled 2015-06-13: qty 2

## 2015-06-13 MED ORDER — IBUPROFEN 600 MG PO TABS: 600.0000 mg | ORAL_TABLET | Freq: Three times a day (TID) | ORAL | Status: DC | PRN

## 2015-06-13 NOTE — ED Notes (Signed)
MD at bedside. 

## 2015-06-13 NOTE — ED Notes (Addendum)
Pt reports falling down a flight of stairs after tripping and hit a concrete wall and floor.  Pt has head pain, nausea, right arm pain, left elbow pain, knees, back. Pt reports left eye pressure.  Pt alert and oriented.

## 2015-06-13 NOTE — ED Provider Notes (Addendum)
CSN: [id number]     Arrival date & time 06/13/15  1235 History   First MD Initiated Contact with Patient 06/13/15 1518     Chief Complaint  Patient presents with  . Fall     HPI She reports falling down a flight of stairs after tripping.  She states that she slid down approximately 10-12 stairs on her right side.  She is reporting severe pain in her right upper extremity and her right lower extremity.  She also reports pain in her low back.  She localizes pain to her right shoulder, right knee, right ankle, right foot and lumbar spine.  She reports her pain is moderate in severity worse with movement and palpation.  She denies neck pain.  She reports minor head injury without loss of consciousness.   Past Medical History  Diagnosis Date  . Type 2 diabetes mellitus ([place])   . Essential hypertension   . Hyperlipidemia   . COPD (chronic obstructive pulmonary disease) ([place])   . GERD (gastroesophageal reflux disease)   . CAD (coronary artery disease)     Moderate nonobstructive 2012-2013, [place]  . Hypothyroid   . Lung nodules   . Diverticulosis   . Bipolar affective ([place])   . Left-sided weakness 03/20/2014    MRI brain negative for stroke.  . 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 Back surgery    . Abdominal hysterectomy    . Appendectomy    . Tonsillectomy    . Ankle surgery    . Cholecystectomy    . Nose surgery    . Cardiac catheterization N/A 01/31/2015    Procedure: Left Heart Cath and Coronary Angiography;  Surgeon: Burnell Blanks, MD;  Location: [place] [facility];  Service: Cardiovascular;  Laterality: N/A;   Family History  Problem Relation Age of Onset  . Coronary artery disease Father 32  . Emphysema Father    Social History  Substance Use Topics  . Smoking status: Current Every Day Smoker -- 2.00 packs/day for 40 years    Types: Cigarettes    Start date: 03/04/1971  . Smokeless tobacco: Never Used   . Alcohol Use: No   OB History    No data available     Review of Systems  All other systems reviewed and are negative.     Allergies  Penicillins; Pineapple; Strawberry extract; Aspartame and phenylalanine; and Poultry meal  Home Medications   Prior to Admission medications   Medication Sig Start Date End Date Taking? Authorizing Provider  albuterol (PROVENTIL HFA;VENTOLIN HFA) 108 (90 BASE) MCG/ACT inhaler Inhale 2 puffs into the lungs every 6 (six) hours as needed for wheezing or shortness of breath.   Yes Historical Provider, MD  amLODipine (NORVASC) 10 MG tablet Take 1 tablet (10 mg total) by mouth daily. 06/05/14  Yes Herminio Commons, MD  carisoprodol (SOMA) 350 MG tablet Take 350 mg by mouth 4 (four) times daily as needed for muscle spasms.  05/27/14  Yes Historical Provider, MD  dexlansoprazole (DEXILANT) 60 MG capsule Take 1 capsule (60 mg total) by mouth every evening. 03/20/14  Yes Rexene Alberts, MD  gabapentin (NEURONTIN) 800 MG tablet Take 800 mg by mouth 3 (three) times daily.   Yes Historical Provider, MD  rOPINIRole (REQUIP) 3 MG tablet Take 1 tablet (3 mg total) by mouth at bedtime. 03/20/14  Yes Rexene Alberts, MD  zolpidem (AMBIEN) 10 MG tablet Take 1 tablet (10 mg  total) by mouth at bedtime. 03/20/14  Yes Rexene Alberts, MD  cloNIDine (CATAPRES - DOSED IN MG/24 HR) 0.3 mg/24hr patch Place 1 patch (0.3 mg total) onto the skin once a week. Sundays Patient not taking: Reported on 06/13/2015 12/06/14   Herminio Commons, MD  EPINEPHrine (EPI-PEN) 0.3 mg/0.3 mL DEVI Inject 0.3 mg into the muscle once as needed (allergic reaction).     Historical Provider, MD  hydrALAZINE (APRESOLINE) 50 MG tablet Take 1 tablet (50 mg total) by mouth 3 (three) times daily. Patient not taking: Reported on 06/13/2015 01/29/15   Herminio Commons, MD  ibuprofen (ADVIL,MOTRIN) 600 MG tablet Take 1 tablet (600 mg total) by mouth every 8 (eight) hours as needed. 06/13/15   Jola Schmidt, MD   lisinopril (PRINIVIL,ZESTRIL) 40 MG tablet Take 1 tablet (40 mg total) by mouth daily. Patient not taking: Reported on 06/13/2015 10/01/14   Herminio Commons, MD  spironolactone (ALDACTONE) 25 MG tablet Take 1 tablet (25 mg total) by mouth daily. Patient not taking: Reported on 06/13/2015 01/30/15   Herminio Commons, MD   BP 199/90 mmHg  Pulse 78  Temp(Src) 97.6 F (36.4 C) (Oral)  Resp 18  Ht 5\' 7"  (1.702 m)  Wt 209 lb (94.802 kg)  BMI 32.73 kg/m2  SpO2 98% Physical Exam  Constitutional: She is oriented to person, place, and time. She appears well-developed and well-nourished. No distress.  HENT:  Head: Normocephalic and atraumatic.  Eyes: EOM are normal.  Neck: Normal range of motion.  No c spine tenderness. c spine cleared by NEXUS.   Cardiovascular: Normal rate, regular rhythm and normal heart sounds.   Pulmonary/Chest: Effort normal and breath sounds normal.  Abdominal: Soft. She exhibits no distension. There is no tenderness.  Musculoskeletal: Normal range of motion.  Tenderness to palpation of right ankle, knee and foot without deformity. Tenderness to anterior right shoulder without deformity. Able to range right ankle and and knee. No thoracic tenderness. Mild lumbar and paralumbar tenderness without stepoff  Neurological: She is alert and oriented to person, place, and time.  Skin: Skin is warm and dry.  Psychiatric: She has a normal mood and affect. Judgment normal.  Nursing note and vitals reviewed.   ED Course  Procedures (including critical care time) Labs Review Labs Reviewed - No data to display  Imaging Review Dg Lumbar Spine Complete  06/13/2015  CLINICAL DATA:  Fall.  Back pain EXAM: LUMBAR SPINE - COMPLETE 4+ VIEW COMPARISON:  07/18/2014 FINDINGS: Pedicle screw and interbody fusion L5-S1 unchanged in position from the prior study Normal lumbar alignment. Negative for fracture. Remaining disc spaces are normal. IMPRESSION: Negative for fracture.  Electronically Signed   By: Franchot Gallo M.D.   On: 06/13/2015 16:30   Dg Shoulder Right  06/13/2015  CLINICAL DATA:  [place] down about 10 steps today, right shoulder pain EXAM: RIGHT SHOULDER - 2+ VIEW COMPARISON:  None. FINDINGS: Three views of the right shoulder submitted. No acute fracture or subluxation. No radiopaque foreign body. IMPRESSION: Negative. Electronically Signed   By: Lahoma Crocker M.D.   On: 06/13/2015 16:32   Dg Ankle Complete Right  06/13/2015  CLINICAL DATA:  Fall.  Pain. EXAM: RIGHT ANKLE - COMPLETE 3+ VIEW COMPARISON:  None. FINDINGS: There is no evidence of fracture, dislocation, or joint effusion. There is no evidence of arthropathy or other focal bone abnormality. Soft tissues are unremarkable. Calcification in the distal Achilles tendon. IMPRESSION: Negative. Electronically Signed   By: Franchot Gallo  M.D.   On: 06/13/2015 16:26   Dg Knee Complete 4 Views Right  06/13/2015  CLINICAL DATA:  [place] down 10 stairs today.  Anterior knee pain EXAM: RIGHT KNEE - COMPLETE 4+ VIEW COMPARISON:  None. FINDINGS: No fracture of the proximal tibia or distal femur. Patella is normal. No joint effusion. IMPRESSION: No fracture or dislocation. Electronically Signed   By: Suzy Bouchard M.D.   On: 06/13/2015 16:28   Dg Foot Complete Right  06/13/2015  CLINICAL DATA:  Stop She fell down about 10 steps today. Pain lateral side of foot. Hx of multiple fx's in the 90'x and early 2000. EXAM: RIGHT FOOT COMPLETE - 3+ VIEW COMPARISON:  None. FINDINGS: No fracture or dislocation of mid foot or forefoot. The phalanges are normal. The calcaneus is normal. No soft tissue abnormality. IMPRESSION: No fracture or dislocation. Electronically Signed   By: Suzy Bouchard M.D.   On: 06/13/2015 16:34   I have personally reviewed and evaluated these images and lab results as part of my medical decision-making.   EKG Interpretation None      MDM   Final diagnoses:  Contusion of multiple sites    X-rays  negative for acute fracture.  Likely all contusion.  The patient will be sore for several days and this was explained to the patient.  She'll be placed on 600 mg of ibuprofen 3 times a day for discomfort and pain.  She understands to follow-up with her primary care physician.    Jola Schmidt, MD 06/13/15 [place], MD 06/19/15 1255

## 2015-06-18 ENCOUNTER — Observation Stay (HOSPITAL_COMMUNITY)
Admission: EM | Admit: 2015-06-18 | Discharge: 2015-06-20 | Disposition: A | Discharge status: Home / Self Care | Payer: Medicare Other | Attending: Internal Medicine | Admitting: Internal Medicine

## 2015-06-18 ENCOUNTER — Emergency Department (HOSPITAL_COMMUNITY): Payer: Medicare Other

## 2015-06-18 ENCOUNTER — Encounter (HOSPITAL_COMMUNITY): Payer: Self-pay | Admitting: *Deleted

## 2015-06-18 DIAGNOSIS — F319 Bipolar disorder, unspecified: Secondary | ICD-10-CM | POA: Diagnosis not present

## 2015-06-18 DIAGNOSIS — E039 Hypothyroidism, unspecified: Secondary | ICD-10-CM | POA: Diagnosis not present

## 2015-06-18 DIAGNOSIS — R079 Chest pain, unspecified: Principal | ICD-10-CM | POA: Insufficient documentation

## 2015-06-18 DIAGNOSIS — F17210 Nicotine dependence, cigarettes, uncomplicated: Secondary | ICD-10-CM | POA: Diagnosis not present

## 2015-06-18 DIAGNOSIS — E785 Hyperlipidemia, unspecified: Secondary | ICD-10-CM | POA: Diagnosis not present

## 2015-06-18 DIAGNOSIS — I10 Essential (primary) hypertension: Secondary | ICD-10-CM | POA: Diagnosis not present

## 2015-06-18 DIAGNOSIS — Z880 Allergy status to penicillin: Secondary | ICD-10-CM | POA: Insufficient documentation

## 2015-06-18 DIAGNOSIS — J449 Chronic obstructive pulmonary disease, unspecified: Secondary | ICD-10-CM | POA: Insufficient documentation

## 2015-06-18 DIAGNOSIS — Z79899 Other long term (current) drug therapy: Secondary | ICD-10-CM | POA: Diagnosis not present

## 2015-06-18 DIAGNOSIS — K219 Gastro-esophageal reflux disease without esophagitis: Secondary | ICD-10-CM | POA: Insufficient documentation

## 2015-06-18 DIAGNOSIS — R531 Weakness: Secondary | ICD-10-CM

## 2015-06-18 DIAGNOSIS — R200 Anesthesia of skin: Secondary | ICD-10-CM | POA: Diagnosis not present

## 2015-06-18 DIAGNOSIS — R0789 Other chest pain: Secondary | ICD-10-CM | POA: Diagnosis present

## 2015-06-18 DIAGNOSIS — R29818 Other symptoms and signs involving the nervous system: Secondary | ICD-10-CM | POA: Diagnosis not present

## 2015-06-18 DIAGNOSIS — IMO0002 Reserved for concepts with insufficient information to code with codable children: Secondary | ICD-10-CM

## 2015-06-18 DIAGNOSIS — R197 Diarrhea, unspecified: Secondary | ICD-10-CM | POA: Insufficient documentation

## 2015-06-18 DIAGNOSIS — E119 Type 2 diabetes mellitus without complications: Secondary | ICD-10-CM | POA: Diagnosis not present

## 2015-06-18 DIAGNOSIS — Z8673 Personal history of transient ischemic attack (TIA), and cerebral infarction without residual deficits: Secondary | ICD-10-CM | POA: Insufficient documentation

## 2015-06-18 DIAGNOSIS — I2510 Atherosclerotic heart disease of native coronary artery without angina pectoris: Secondary | ICD-10-CM | POA: Insufficient documentation

## 2015-06-18 DIAGNOSIS — K5790 Diverticulosis of intestine, part unspecified, without perforation or abscess without bleeding: Secondary | ICD-10-CM | POA: Diagnosis not present

## 2015-06-18 DIAGNOSIS — R202 Paresthesia of skin: Secondary | ICD-10-CM | POA: Diagnosis not present

## 2015-06-18 LAB — BASIC METABOLIC PANEL
ANION GAP: 9 (ref 5–15)
BUN: 19 mg/dL (ref 6–20)
CHLORIDE: 108 mmol/L (ref 101–111)
CO2: 24 mmol/L (ref 22–32)
Calcium: 9.1 mg/dL (ref 8.9–10.3)
Creatinine, Ser: 0.87 mg/dL (ref 0.44–1.00)
GFR calc non Af Amer: >60 mL/min (ref >60)
Glucose, Bld: 122 mg/dL — ABNORMAL HIGH (ref 65–99)
POTASSIUM: 3.7 mmol/L (ref 3.5–5.1)
Sodium: 141 mmol/L (ref 135–145)

## 2015-06-18 LAB — I-STAT CHEM 8, ED
BUN: 18 mg/dL (ref 6–20)
CHLORIDE: 105 mmol/L (ref 101–111)
Calcium, Ion: 1.19 mmol/L (ref 1.12–1.23)
Creatinine, Ser: 0.8 mg/dL (ref 0.44–1.00)
Glucose, Bld: 85 mg/dL (ref 65–99)
HCT: 43 % (ref 36.0–46.0)
Hemoglobin: 14.6 g/dL (ref 12.0–15.0)
POTASSIUM: 3.9 mmol/L (ref 3.5–5.1)
SODIUM: 143 mmol/L (ref 135–145)
TCO2: 26 mmol/L (ref 0–100)

## 2015-06-18 LAB — CBC
HEMATOCRIT: 40.9 % (ref 36.0–46.0)
HEMOGLOBIN: 13.4 g/dL (ref 12.0–15.0)
MCH: 29.1 pg (ref 26.0–34.0)
MCHC: 32.8 g/dL (ref 30.0–36.0)
MCV: 88.9 fL (ref 78.0–100.0)
Platelets: 281 10*3/uL (ref 150–400)
RBC: 4.6 MIL/uL (ref 3.87–5.11)
RDW: 14.2 % (ref 11.5–15.5)
WBC: 10.9 10*3/uL — ABNORMAL HIGH (ref 4.0–10.5)

## 2015-06-18 LAB — ETHANOL

## 2015-06-18 LAB — DIFFERENTIAL
BASOS ABS: 0.1 10*3/uL (ref 0.0–0.1)
BASOS PCT: 1 %
EOS ABS: 0.3 10*3/uL (ref 0.0–0.7)
EOS PCT: 2 %
Lymphocytes Relative: 51 %
Lymphs Abs: 5.5 10*3/uL — ABNORMAL HIGH (ref 0.7–4.0)
Monocytes Absolute: 0.7 10*3/uL (ref 0.1–1.0)
Monocytes Relative: 7 %
NEUTROS PCT: 39 %
Neutro Abs: 4.2 10*3/uL (ref 1.7–7.7)

## 2015-06-18 LAB — PROTIME-INR
INR: 1.12 (ref 0.00–1.49)
PROTHROMBIN TIME: 14.6 s (ref 11.6–15.2)

## 2015-06-18 LAB — TROPONIN I

## 2015-06-18 LAB — APTT: APTT: 28 s (ref 24–37)

## 2015-06-18 MED ORDER — ASPIRIN 81 MG PO CHEW
324.0000 mg | CHEWABLE_TABLET | Freq: Once | ORAL | Status: AC
  Administered 2015-06-19: 324 mg via ORAL
  Filled 2015-06-18: qty 4

## 2015-06-18 MED ORDER — MORPHINE SULFATE (PF) 4 MG/ML IV SOLN
4.0000 mg | Freq: Once | INTRAVENOUS | Status: AC
  Administered 2015-06-18: 4 mg via INTRAVENOUS
  Filled 2015-06-18: qty 1

## 2015-06-18 MED ORDER — ONDANSETRON HCL 4 MG/2ML IJ SOLN
4.0000 mg | Freq: Once | INTRAMUSCULAR | Status: AC
  Administered 2015-06-18: 4 mg via INTRAVENOUS
  Filled 2015-06-18: qty 2

## 2015-06-18 NOTE — ED Notes (Signed)
Code stroke cancelled 

## 2015-06-18 NOTE — ED Notes (Signed)
Pt c/o left side chest pain that radiates to left arm, neck, shoulder area that started yesterday, pt reports that the pain went away but returned again today, pt also c/o headache for the past few days and diarrhea that started last night,

## 2015-06-18 NOTE — ED Provider Notes (Signed)
CSN: [id number]     Arrival date & time 06/18/15  2059 History   First MD Initiated Contact with Patient 06/18/15 2141     Chief Complaint  Patient presents with  . Chest Pain     (Consider location/radiation/quality/duration/timing/severity/associated sxs/prior Treatment) The history is provided by the patient.   Katrina [name] is a 55 y.o. 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  She denies shortness of breath or palpitations, no diaphoresis.  She also endorses developing worsened facial numbness and drooping and left arm heaviness within the past 2 hours, worse than her baselnie residual cva symptoms.  She has had no treatments for either of her symptoms prior to arrival.  Incidentally, she also developed diarrhea yesterday evening which she treated with Imodium and is still present today but reduced.     Past Medical History  Diagnosis Date  . Type 2 diabetes mellitus ([place])   . Essential hypertension   . Hyperlipidemia   . COPD (chronic obstructive pulmonary disease) ([place])   . GERD (gastroesophageal reflux disease)   . CAD (coronary artery disease)     Moderate nonobstructive 2012-2013, [place]  . Hypothyroid   . Lung nodules   . Diverticulosis   . Bipolar affective ([place])   . Left-sided weakness 03/20/2014    MRI brain negative for stroke.  . 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 Back surgery    . Abdominal hysterectomy    . Appendectomy    . Tonsillectomy    . Ankle surgery    . Cholecystectomy    . Nose  surgery    . Cardiac catheterization N/A 01/31/2015    Procedure: Left Heart Cath and Coronary Angiography;  Surgeon: Burnell Blanks, MD;  Location: [place] [facility];  Service: Cardiovascular;  Laterality: N/A;   Family History  Problem Relation Age of Onset  . Coronary artery disease Father 28  . Emphysema Father    Social History  Substance Use Topics  . Smoking status: Current Every Day Smoker -- 2.00 packs/day for 40 years    Types: Cigarettes    Start date: 03/04/1971  . Smokeless tobacco: Never Used  . Alcohol Use: No   OB History    No data available     Review of Systems  Constitutional: Negative for fever.  HENT: Negative for congestion and sore throat.   Eyes: Negative.   Respiratory: Negative for chest tightness and shortness of breath.   Cardiovascular: Positive for chest pain.  Gastrointestinal: Positive for diarrhea. Negative for nausea, vomiting and abdominal pain.  Genitourinary: Negative.   Musculoskeletal: Negative for joint swelling, arthralgias and neck pain.  Skin: Negative.  Negative for rash and wound.  Neurological: Positive for facial asymmetry, weakness and numbness. Negative for dizziness, light-headedness and headaches.  Psychiatric/Behavioral: Negative.       Allergies  Penicillins; Pineapple; Strawberry extract; Aspartame and phenylalanine; and Poultry meal  Home Medications   Prior to Admission medications   Medication Sig Start Date End Date Taking? Authorizing Provider  amLODipine (NORVASC) 10 MG tablet Take 1 tablet (10 mg total) by  mouth daily. 06/05/14  Yes Herminio Commons, MD  carisoprodol (SOMA) 350 MG tablet Take 350 mg by mouth 4 (four) times daily as needed for muscle spasms.  05/27/14  Yes Historical Provider, MD  dexlansoprazole (DEXILANT) 60 MG capsule Take 1 capsule (60 mg total) by mouth every evening. 03/20/14  Yes Rexene Alberts, MD  gabapentin (NEURONTIN) 800 MG tablet Take 800 mg by mouth 3 (three) times  daily.   Yes Historical Provider, MD  rOPINIRole (REQUIP) 3 MG tablet Take 1 tablet (3 mg total) by mouth at bedtime. 03/20/14  Yes Rexene Alberts, MD  spironolactone (ALDACTONE) 25 MG tablet Take 1 tablet (25 mg total) by mouth daily. 01/30/15  Yes Herminio Commons, MD  zolpidem (AMBIEN) 10 MG tablet Take 1 tablet (10 mg total) by mouth at bedtime. 03/20/14  Yes Rexene Alberts, MD  albuterol (PROVENTIL HFA;VENTOLIN HFA) 108 (90 BASE) MCG/ACT inhaler Inhale 2 puffs into the lungs every 6 (six) hours as needed for wheezing or shortness of breath.    Historical Provider, MD  cloNIDine (CATAPRES - DOSED IN MG/24 HR) 0.3 mg/24hr patch Place 1 patch (0.3 mg total) onto the skin once a week. Sundays Patient not taking: Reported on 06/13/2015 12/06/14   Herminio Commons, MD  EPINEPHrine (EPI-PEN) 0.3 mg/0.3 mL DEVI Inject 0.3 mg into the muscle once as needed (allergic reaction).     Historical Provider, MD  ibuprofen (ADVIL,MOTRIN) 600 MG tablet Take 1 tablet (600 mg total) by mouth every 8 (eight) hours as needed. 06/13/15   Jola Schmidt, MD  lisinopril (PRINIVIL,ZESTRIL) 40 MG tablet Take 1 tablet (40 mg total) by mouth daily. Patient not taking: Reported on 06/13/2015 10/01/14   Herminio Commons, MD   BP 209/98 mmHg  Pulse 81  Temp(Src) 98.2 F (36.8 C) (Oral)  Resp 16  Ht 5\' 7"  (1.702 m)  Wt 94.802 kg  BMI 32.73 kg/m2  SpO2 98% Physical Exam  Constitutional: She is oriented to person, place, and time. She appears well-developed and well-nourished.  Hypertensive  HENT:  Head: Normocephalic and atraumatic.  Eyes: Conjunctivae are normal.  Neck: Normal range of motion.  Cardiovascular: Normal rate, regular rhythm, normal heart sounds and intact distal pulses.   Pulmonary/Chest: Effort normal and breath sounds normal. No respiratory distress. She has no wheezes.  Abdominal: Soft. Bowel sounds are normal. There is no tenderness.  Musculoskeletal: Normal range of motion.  Neurological: She is  alert and oriented to person, place, and time. A cranial nerve deficit and sensory deficit is present.  Decreased grip strength 3/5 left, 5/5 right.  Positive left pronator drift. Left sided facial droop involving mouth, forehead sparing. Decreased sensation to fine touch left cheek and chin.  Skin: Skin is warm and dry.  Psychiatric: She has a normal mood and affect.  Nursing note and vitals reviewed.   ED Course  Procedures (including critical care time) Labs Review Labs Reviewed  BASIC METABOLIC PANEL - Abnormal; Notable for the following:    Glucose, Bld 122 (*)    All other components within normal limits  CBC - Abnormal; Notable for the following:    WBC 10.9 (*)    All other components within normal limits  DIFFERENTIAL - Abnormal; Notable for the following:    Lymphs Abs 5.5 (*)    All other components within normal limits  TROPONIN I  ETHANOL  PROTIME-INR  APTT  URINE RAPID DRUG SCREEN, HOSP PERFORMED  URINALYSIS, ROUTINE W REFLEX MICROSCOPIC (NOT AT [facility])  I-STAT CHEM 8, ED    Imaging Review Dg Chest 2 View  06/18/2015  CLINICAL DATA:  55 year old female with chest pain EXAM: CHEST  2 VIEW COMPARISON:  Chest radiograph dated 12/24/2014 FINDINGS: The heart size and mediastinal contours are within normal limits. Both lungs are clear. The visualized skeletal structures are unremarkable. IMPRESSION: No active cardiopulmonary disease. Electronically Signed   By: Anner Crete M.D.   On: 06/18/2015 23:08   Ct Head Wo Contrast  06/18/2015  CLINICAL DATA:  55 year old female with left-sided facial numbness x2 hours. Code stroke. EXAM: CT HEAD WITHOUT CONTRAST TECHNIQUE: Contiguous axial images were obtained from the base of the skull through the vertex without intravenous contrast. COMPARISON:  CT dated 01/24/2015 FINDINGS: The ventricles and sulci are appropriate in size for patient's age. Minimal periventricular and deep white matter hypodensities represent chronic  microvascular ischemic changes. There is no intracranial hemorrhage. No mass effect or midline shift identified. The visualized paranasal sinuses and mastoid air cells are well aerated. The calvarium is intact. IMPRESSION: No acute intracranial hemorrhage. Mild chronic microvascular ischemic disease. If symptoms persist and there are no contraindications, MRI may provide better evaluation if clinically indicated These results were called by telephone at the time of interpretation on 06/18/2015 at 10:31 pm to nurse Frye-Yarbrough , who verbally acknowledged these results. Electronically Signed   By: Anner Crete M.D.   On: 06/18/2015 22:34   I have personally reviewed and evaluated these images and lab results as part of my medical decision-making.   EKG Interpretation   Date/Time:  Wednesday June 18 2015 21:08:18 EST Ventricular Rate:  91 PR Interval:  160 QRS Duration: 94 QT Interval:  364 QTC Calculation: 448 R Axis:   41 Text Interpretation:  Sinus rhythm Baseline wander When compared with ECG  of 01/23/2015 No significant change was found Confirmed by [facility]  MD,  Nunzio Cory [phone]) on 06/18/2015 9:43:01 PM      MDM   Final diagnoses:  Chest pain    Review of chart indicates patient had a negative coronary catheterization August 2016 by Dr. Julianne Handler.  Code stroke initiated upon pt complaint of worsening of her left sided increased weakness (worse from baseline weakness from CVA 04/07/15 per patient.  Admitted to [facility] for this cva).  She was evaluated by teleneurology and it was felt sx could be conversion disorder given inconsistent exam findings (left sided upper extremity weakness but was able to touch her nose).  Recommends MRI to complete workup (no other studies recommended if this is normal.  Also will need serial troponins to r/o cardiac source of chest pain.  She was given ASA 324 mg PO once Ct head ruled out hemorrhagic stroke.  She was also given morphine IV for pain  relief.  Discussed with Dr. Darrick Meigs, temp admit orders placed for obs admission.    Evalee Jefferson, PA-C 06/19/15 [place], DO 06/21/15 1606

## 2015-06-18 NOTE — ED Notes (Signed)
Pt was able to use left arm to hold a cup of water and use that arm and hand to drink from the cup,

## 2015-06-19 ENCOUNTER — Observation Stay (HOSPITAL_COMMUNITY): Payer: Medicare Other

## 2015-06-19 DIAGNOSIS — R079 Chest pain, unspecified: Secondary | ICD-10-CM | POA: Diagnosis not present

## 2015-06-19 DIAGNOSIS — I10 Essential (primary) hypertension: Secondary | ICD-10-CM | POA: Diagnosis not present

## 2015-06-19 LAB — TROPONIN I
Troponin I: <0.03 ng/mL (ref <0.031)
Troponin I: <0.03 ng/mL (ref <0.031)

## 2015-06-19 LAB — URINALYSIS, ROUTINE W REFLEX MICROSCOPIC
Bilirubin Urine: NEGATIVE
Glucose, UA: NEGATIVE mg/dL
KETONES UR: NEGATIVE mg/dL
LEUKOCYTES UA: NEGATIVE
NITRITE: NEGATIVE
PROTEIN: NEGATIVE mg/dL
Specific Gravity, Urine: 1.025 (ref 1.005–1.030)
pH: 6.5 (ref 5.0–8.0)

## 2015-06-19 LAB — RAPID URINE DRUG SCREEN, HOSP PERFORMED
Amphetamines: NOT DETECTED
Barbiturates: NOT DETECTED
Benzodiazepines: NOT DETECTED
COCAINE: NOT DETECTED
OPIATES: POSITIVE — AB
Tetrahydrocannabinol: NOT DETECTED

## 2015-06-19 LAB — URINE MICROSCOPIC-ADD ON

## 2015-06-19 MED ORDER — ACETAMINOPHEN 325 MG PO TABS: 650.0000 mg | ORAL_TABLET | ORAL | Status: DC | PRN

## 2015-06-19 MED ORDER — ALUM & MAG HYDROXIDE-SIMETH 200-200-20 MG/5ML PO SUSP
30.0000 mL | Freq: Once | ORAL | Status: AC
  Administered 2015-06-19: 30 mL via ORAL
  Filled 2015-06-19: qty 30

## 2015-06-19 MED ORDER — GABAPENTIN 400 MG PO CAPS
800.0000 mg | ORAL_CAPSULE | Freq: Three times a day (TID) | ORAL | Status: DC
  Administered 2015-06-19 (×3): 800 mg via ORAL
  Filled 2015-06-19 (×4): qty 2

## 2015-06-19 MED ORDER — ONDANSETRON HCL 4 MG/2ML IJ SOLN: 4.0000 mg | Freq: Four times a day (QID) | INTRAMUSCULAR | Status: DC | PRN

## 2015-06-19 MED ORDER — ENOXAPARIN SODIUM 40 MG/0.4ML ~~LOC~~ SOLN
40.0000 mg | SUBCUTANEOUS | Status: DC
  Filled 2015-06-19 (×2): qty 0.4

## 2015-06-19 MED ORDER — ONDANSETRON HCL 4 MG/2ML IJ SOLN: 4.0000 mg | Freq: Three times a day (TID) | INTRAMUSCULAR | Status: AC | PRN

## 2015-06-19 MED ORDER — ZOLPIDEM TARTRATE 5 MG PO TABS
5.0000 mg | ORAL_TABLET | Freq: Every day | ORAL | Status: DC
  Administered 2015-06-19: 5 mg via ORAL
  Filled 2015-06-19: qty 1

## 2015-06-19 MED ORDER — ROPINIROLE HCL 1 MG PO TABS
3.0000 mg | ORAL_TABLET | Freq: Every day | ORAL | Status: DC
  Administered 2015-06-19: 3 mg via ORAL
  Filled 2015-06-19: qty 3

## 2015-06-19 MED ORDER — PANTOPRAZOLE SODIUM 40 MG PO TBEC
40.0000 mg | DELAYED_RELEASE_TABLET | Freq: Every day | ORAL | Status: DC
  Administered 2015-06-19: 40 mg via ORAL
  Filled 2015-06-19 (×2): qty 1

## 2015-06-19 MED ORDER — AMLODIPINE BESYLATE 5 MG PO TABS
10.0000 mg | ORAL_TABLET | Freq: Every day | ORAL | Status: DC
  Administered 2015-06-19: 10 mg via ORAL
  Filled 2015-06-19 (×2): qty 2

## 2015-06-19 MED ORDER — ALBUTEROL SULFATE (2.5 MG/3ML) 0.083% IN NEBU: 3.0000 mL | INHALATION_SOLUTION | Freq: Four times a day (QID) | RESPIRATORY_TRACT | Status: DC | PRN

## 2015-06-19 MED ORDER — SPIRONOLACTONE 25 MG PO TABS
25.0000 mg | ORAL_TABLET | Freq: Every day | ORAL | Status: DC
  Filled 2015-06-19 (×2): qty 1

## 2015-06-19 NOTE — Progress Notes (Signed)
Subjective: Patient was admitted due to chest pain. Claims the chest pain was on the mid sternal radiating to the left arm and the face. Patient has accelerated hypertension which was very difficulty to control. She was evaluated and followed in [place].  Objective: Vital signs in last 24 hours: Temp:  [98.1 F (36.7 C)-98.6 F (37 C)] 98.4 F (36.9 C) (01/12 0526) Pulse Rate:  [68-96] 70 (01/12 0526) Resp:  [16-23] 20 (01/12 0526) BP: (147-221)/(71-114) 147/71 mmHg (01/12 0526) SpO2:  [93 %-99 %] 98 % (01/12 0526) Weight:  [94.802 kg (209 lb)-96.117 kg (211 lb 14.4 oz)] 96.117 kg (211 lb 14.4 oz) (01/12 0054) Weight change:  Last BM Date: 06/18/15  Intake/Output from previous day:    PHYSICAL EXAM General appearance: alert and no distress Resp: clear to auscultation bilaterally Cardio: S1, S2 normal GI: soft, non-tender; bowel sounds normal; no masses,  no organomegaly Extremities: extremities normal, atraumatic, no cyanosis or edema  Lab Results:  Results for orders placed or performed during the hospital encounter of 06/18/15 (from the past 48 hour(s))  Basic metabolic panel     Status: Abnormal   Collection Time: 06/18/15  9:26 PM  Result Value Ref Range   Sodium 141 135 - 145 mmol/L   Potassium 3.7 3.5 - 5.1 mmol/L   Chloride 108 101 - 111 mmol/L   CO2 24 22 - 32 mmol/L   Glucose, Bld 122 (H) 65 - 99 mg/dL   BUN 19 6 - 20 mg/dL   Creatinine, Ser 0.87 0.44 - 1.00 mg/dL   Calcium 9.1 8.9 - 10.3 mg/dL   GFR calc non Af Amer >60 >60 mL/min   GFR calc Af Amer >60 >60 mL/min    Comment: (NOTE) The eGFR has been calculated using the CKD EPI equation. This calculation has not been validated in all clinical situations. eGFR's persistently <60 mL/min signify possible Chronic Kidney Disease.    Anion gap 9 5 - 15  CBC     Status: Abnormal   Collection Time: 06/18/15  9:26 PM  Result Value Ref Range   WBC 10.9 (H) 4.0 - 10.5 K/uL   RBC 4.60 3.87 - 5.11 MIL/uL   Hemoglobin 13.4 12.0 - 15.0 g/dL   HCT 40.9 36.0 - 46.0 %   MCV 88.9 78.0 - 100.0 fL   MCH 29.1 26.0 - 34.0 pg   MCHC 32.8 30.0 - 36.0 g/dL   RDW 14.2 11.5 - 15.5 %   Platelets 281 150 - 400 K/uL  Troponin I     Status: None   Collection Time: 06/18/15  9:26 PM  Result Value Ref Range   Troponin I <0.03 <0.031 ng/mL    Comment:        NO INDICATION OF MYOCARDIAL INJURY.   Ethanol     Status: None   Collection Time: 06/18/15  9:26 PM  Result Value Ref Range   Alcohol, Ethyl (B) <5 <5 mg/dL    Comment:        LOWEST DETECTABLE LIMIT FOR SERUM ALCOHOL IS 5 mg/dL FOR MEDICAL PURPOSES ONLY   Protime-INR     Status: None   Collection Time: 06/18/15  9:26 PM  Result Value Ref Range   Prothrombin Time 14.6 11.6 - 15.2 seconds   INR 1.12 0.00 - 1.49  APTT     Status: None   Collection Time: 06/18/15  9:26 PM  Result Value Ref Range   aPTT 28 24 - 37 seconds  Differential  Status: Abnormal   Collection Time: 06/18/15  9:26 PM  Result Value Ref Range   Neutrophils Relative % 39 %   Neutro Abs 4.2 1.7 - 7.7 K/uL   Lymphocytes Relative 51 %   Lymphs Abs 5.5 (H) 0.7 - 4.0 K/uL   Monocytes Relative 7 %   Monocytes Absolute 0.7 0.1 - 1.0 K/uL   Eosinophils Relative 2 %   Eosinophils Absolute 0.3 0.0 - 0.7 K/uL   Basophils Relative 1 %   Basophils Absolute 0.1 0.0 - 0.1 K/uL  I-Stat Chem 8, ED  (not at [facility])     Status: None   Collection Time: 06/18/15 10:54 PM  Result Value Ref Range   Sodium 143 135 - 145 mmol/L   Potassium 3.9 3.5 - 5.1 mmol/L   Chloride 105 101 - 111 mmol/L   BUN 18 6 - 20 mg/dL   Creatinine, Ser 0.80 0.44 - 1.00 mg/dL   Glucose, Bld 85 65 - 99 mg/dL   Calcium, Ion 1.19 1.12 - 1.23 mmol/L   TCO2 26 0 - 100 mmol/L   Hemoglobin 14.6 12.0 - 15.0 g/dL   HCT 43.0 36.0 - 46.0 %  Urine rapid drug screen (hosp performed)not at [facility]     Status: Abnormal   Collection Time: 06/19/15 12:09 AM  Result Value Ref Range   Opiates POSITIVE (A) NONE DETECTED    Cocaine NONE DETECTED NONE DETECTED   Benzodiazepines NONE DETECTED NONE DETECTED   Amphetamines NONE DETECTED NONE DETECTED   Tetrahydrocannabinol NONE DETECTED NONE DETECTED   Barbiturates NONE DETECTED NONE DETECTED    Comment:        DRUG SCREEN FOR MEDICAL PURPOSES ONLY.  IF CONFIRMATION IS NEEDED FOR ANY PURPOSE, NOTIFY LAB WITHIN 5 DAYS.        LOWEST DETECTABLE LIMITS FOR URINE DRUG SCREEN Drug Class       Cutoff (ng/mL) Amphetamine      1000 Barbiturate      200 Benzodiazepine   347 Tricyclics       425 Opiates          300 Cocaine          300 THC              50   Urinalysis, Routine w reflex microscopic (not at [facility])     Status: Abnormal   Collection Time: 06/19/15 12:09 AM  Result Value Ref Range   Color, Urine YELLOW YELLOW   APPearance CLEAR CLEAR   Specific Gravity, Urine 1.025 1.005 - 1.030   pH 6.5 5.0 - 8.0   Glucose, UA NEGATIVE NEGATIVE mg/dL   Hgb urine dipstick MODERATE (A) NEGATIVE   Bilirubin Urine NEGATIVE NEGATIVE   Ketones, ur NEGATIVE NEGATIVE mg/dL   Protein, ur NEGATIVE NEGATIVE mg/dL   Nitrite NEGATIVE NEGATIVE   Leukocytes, UA NEGATIVE NEGATIVE  Urine microscopic-add on     Status: Abnormal   Collection Time: 06/19/15 12:09 AM  Result Value Ref Range   Squamous Epithelial / LPF 0-5 (A) NONE SEEN   WBC, UA 0-5 0 - 5 WBC/hpf   RBC / HPF TOO NUMEROUS TO COUNT 0 - 5 RBC/hpf   Bacteria, UA MANY (A) NONE SEEN  Troponin I (q 6hr x 3)     Status: None   Collection Time: 06/19/15  2:01 AM  Result Value Ref Range   Troponin I <0.03 <0.031 ng/mL    Comment:        NO INDICATION OF  MYOCARDIAL INJURY.     ABGS  Recent Labs  06/18/15 2254  TCO2 26   CULTURES No results found for this or any previous visit (from the past 240 hour(s)). Studies/Results: Dg Chest 2 View  06/18/2015  CLINICAL DATA:  55 year old female with chest pain EXAM: CHEST  2 VIEW COMPARISON:  Chest radiograph dated 12/24/2014 FINDINGS: The heart size and mediastinal  contours are within normal limits. Both lungs are clear. The visualized skeletal structures are unremarkable. IMPRESSION: No active cardiopulmonary disease. Electronically Signed   By: Anner Crete M.D.   On: 06/18/2015 23:08   Ct Head Wo Contrast  06/18/2015  CLINICAL DATA:  55 year old female with left-sided facial numbness x2 hours. Code stroke. EXAM: CT HEAD WITHOUT CONTRAST TECHNIQUE: Contiguous axial images were obtained from the base of the skull through the vertex without intravenous contrast. COMPARISON:  CT dated 01/24/2015 FINDINGS: The ventricles and sulci are appropriate in size for patient's age. Minimal periventricular and deep white matter hypodensities represent chronic microvascular ischemic changes. There is no intracranial hemorrhage. No mass effect or midline shift identified. The visualized paranasal sinuses and mastoid air cells are well aerated. The calvarium is intact. IMPRESSION: No acute intracranial hemorrhage. Mild chronic microvascular ischemic disease. If symptoms persist and there are no contraindications, MRI may provide better evaluation if clinically indicated These results were called by telephone at the time of interpretation on 06/18/2015 at 10:31 pm to nurse Frye-Yarbrough , who verbally acknowledged these results. Electronically Signed   By: Anner Crete M.D.   On: 06/18/2015 22:34    Medications: I have reviewed the patient's current medications.  Assesment:   Active Problems:   Chest pain   Accelerated hypertension   Other chest pain    Plan:  Medications reviewed Continue telemetry Cardiology consult      Sandhya Denherder 06/19/2015, 8:29 AM

## 2015-06-19 NOTE — Progress Notes (Signed)
CODE STROKE  Beeper  22:22  Went off after pt. In room Pt. In room   22:20  Mel Almond brought pt. Over as code stroke pt.  I had no idea we had a code stroke pt.   No order.  No beep.  No phone call.  Mel Almond was in ER returning another pt. And told to bring this code stroke.   NO RN came. Order in computer 22:21 Pt. Out of room 22:24  Radiologist called 22:25

## 2015-06-19 NOTE — ED Notes (Signed)
Dr Lama at bedside,  

## 2015-06-19 NOTE — H&P (Signed)
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 Today presents to the hospital with chief complaint of chest pain, with radiation to left shoulder. Patient has a history of recent CVA on 04/07/2015 with residual left-sided weakness, dysarthria and left facial weakness which improved with physical therapy. Patient also complained of worsening facial numbness and drooping with left arm heaviness, worse than the baseline that is doable symptoms. Patient also complains of diarrhea she had 3 loose stools last night and took Imodium today. She denies nausea and vomiting. Admits to having some shortness of breath.  Patient was evaluated by telling neurology and it was felt that patient could be presenting with conversion disorder as the exam was inconsistent. MRI brain ordered as per telemetry neurology.  Allergies:   Allergies  Allergen Reactions  . Penicillins Anaphylaxis    Has patient had a PCN reaction causing immediate rash, facial/tongue/throat swelling, SOB or lightheadedness with hypotension: Yes Has patient had a PCN reaction causing severe rash involving mucus membranes or skin necrosis: Yes Has patient had a PCN reaction that required hospitalization Yes Has patient had a PCN reaction occurring within the last 10 years: No If all of the above answers are "NO", then may proceed with Cephalosporin use.   Marland Kitchen Pineapple Rash  . Strawberry Extract Rash  . Aspartame And Phenylalanine Palpitations  . Poultry Meal Other (See Comments)    Allergic to [place]      Past Medical History    Diagnosis Date  . Type 2 diabetes mellitus ([place])   . Essential hypertension   . Hyperlipidemia   . COPD (chronic obstructive pulmonary disease) ([place])   . GERD (gastroesophageal reflux disease)   . CAD (coronary artery disease)     Moderate nonobstructive 2012-2013, [place]  . Hypothyroid   . Lung nodules   . Diverticulosis   . Bipolar affective ([place])   . Left-sided weakness 03/20/2014    MRI brain negative for stroke.  . Stroke (cerebrum) [facility]) october 2016    Past Surgical History  Procedure Laterality Date  . Back surgery    . Abdominal hysterectomy    . Appendectomy    . Tonsillectomy    . Ankle surgery    . Cholecystectomy    . Nose surgery    . Cardiac catheterization N/A 01/31/2015    Procedure: Left Heart Cath and Coronary Angiography;  Surgeon: Burnell Blanks, MD;  Location: [facility];  Service: Cardiovascular;  Laterality: N/A;    Prior to Admission medications   Medication Sig Start Date End Date Taking? Authorizing Provider  amLODipine (NORVASC) 10 MG tablet Take 1 tablet (10 mg total) by mouth daily. 06/05/14  Yes Herminio Commons, MD  carisoprodol (SOMA) 350 MG tablet Take 350 mg by mouth 4 (four) times daily as needed for muscle spasms.  05/27/14  Yes Historical Provider, MD  dexlansoprazole (DEXILANT) 60 MG capsule Take 1 capsule (60 mg total) by mouth every evening. 03/20/14  Yes Rexene Alberts, MD  gabapentin (NEURONTIN) 800 MG tablet Take 800 mg by mouth 3 (three) times daily.   Yes Historical Provider, MD  rOPINIRole (REQUIP) 3 MG tablet Take 1 tablet (3 mg  total) by mouth at bedtime. 03/20/14  Yes Rexene Alberts, MD  spironolactone (ALDACTONE) 25 MG tablet Take 1 tablet (25 mg total) by mouth daily. 01/30/15  Yes Herminio Commons, MD  zolpidem (AMBIEN) 10 MG tablet Take 1 tablet (10 mg total) by mouth at bedtime. 03/20/14  Yes Rexene Alberts, MD  albuterol (PROVENTIL HFA;VENTOLIN HFA) 108 (90 BASE) MCG/ACT inhaler Inhale 2 puffs into the  lungs every 6 (six) hours as needed for wheezing or shortness of breath.    Historical Provider, MD  cloNIDine (CATAPRES - DOSED IN MG/24 HR) 0.3 mg/24hr patch Place 1 patch (0.3 mg total) onto the skin once a week. Sundays Patient not taking: Reported on 06/13/2015 12/06/14   Herminio Commons, MD  EPINEPHrine (EPI-PEN) 0.3 mg/0.3 mL DEVI Inject 0.3 mg into the muscle once as needed (allergic reaction).     Historical Provider, MD  ibuprofen (ADVIL,MOTRIN) 600 MG tablet Take 1 tablet (600 mg total) by mouth every 8 (eight) hours as needed. 06/13/15   Jola Schmidt, MD  lisinopril (PRINIVIL,ZESTRIL) 40 MG tablet Take 1 tablet (40 mg total) by mouth daily. Patient not taking: Reported on 06/13/2015 10/01/14   Herminio Commons, MD    Social History:  reports that she has been smoking Cigarettes.  She started smoking about 44 years ago. She has a 80 pack-year smoking history. She has never used smokeless tobacco. She reports that she does not drink alcohol or use illicit drugs.  Family History  Problem Relation Age of Onset  . Coronary artery disease Father 33  . Emphysema Father     Danley Danker Weights   06/18/15 2111 06/19/15 0054  Weight: 94.802 kg (209 lb) 96.117 kg (211 lb 14.4 oz)    All the positives are listed in BOLD  Review of Systems:  HEENT: Headache, blurred vision, runny nose, sore throat Neck: Hypothyroidism, hyperthyroidism,,lymphadenopathy Chest : Shortness of breath, history of COPD, Asthma Heart : Chest pain, history of coronary arterey disease GI:  Nausea, vomiting, diarrhea, constipation, GERD GU: Dysuria, urgency, frequency of urination, hematuria Neuro: Stroke, seizures, syncope Psych: Depression, anxiety, hallucinations   Physical Exam: Blood pressure 221/94, pulse 73, temperature 98.6 F (37 C), temperature source Oral, resp. rate 18, height 5\' 7"  (1.702 m), weight 96.117 kg (211 lb 14.4 oz), SpO2 98 %. Constitutional:   Patient is a well-developed and well-nourished  female* in no acute distress and cooperative with exam. Head: Normocephalic and atraumatic Mouth: Mucus membranes moist Eyes: PERRL, EOMI, conjunctivae normal Neck: Supple, No Thyromegaly Cardiovascular: RRR, S1 normal, S2 normal Pulmonary/Chest: CTAB, no wheezes, rales, or rhonchi Abdominal: Soft. Non-tender, non-distended, bowel sounds are normal, no masses, organomegaly, or guarding present.  Neurological: A&O x3, reduced strength in left upper extremity, left-sided facial droop Extremities : No Cyanosis, Clubbing or Edema  Labs on Admission:  Basic Metabolic Panel:  Recent Labs Lab 06/18/15 2126 06/18/15 2254  NA 141 143  K 3.7 3.9  CL 108 105  CO2 24  --   GLUCOSE 122* 85  BUN 19 18  CREATININE 0.87 0.80  CALCIUM 9.1  --    CBC:  Recent Labs Lab 06/18/15 2126 06/18/15 2254  WBC 10.9*  --   NEUTROABS 4.2  --   HGB 13.4 14.6  HCT 40.9 43.0  MCV 88.9  --   PLT 281  --    Cardiac Enzymes:  Recent Labs Lab 06/18/15 2126  TROPONINI <0.03    Radiological Exams on Admission: Dg Chest 2 View  06/18/2015  CLINICAL DATA:  55 year old female with chest pain EXAM: CHEST  2 VIEW COMPARISON:  Chest radiograph dated 12/24/2014 FINDINGS: The heart size and mediastinal contours are within normal limits. Both lungs are clear. The visualized skeletal structures are unremarkable. IMPRESSION: No active cardiopulmonary disease. Electronically Signed   By: Anner Crete M.D.   On: 06/18/2015 23:08   Ct Head Wo Contrast  06/18/2015  CLINICAL DATA:  55 year old female with left-sided facial numbness x2 hours. Code stroke. EXAM: CT HEAD WITHOUT CONTRAST TECHNIQUE: Contiguous axial images were obtained from the base of the skull through the vertex without intravenous contrast. COMPARISON:  CT dated 01/24/2015 FINDINGS: The ventricles and sulci are appropriate in size for patient's age. Minimal periventricular and deep white matter hypodensities represent chronic microvascular  ischemic changes. There is no intracranial hemorrhage. No mass effect or midline shift identified. The visualized paranasal sinuses and mastoid air cells are well aerated. The calvarium is intact. IMPRESSION: No acute intracranial hemorrhage. Mild chronic microvascular ischemic disease. If symptoms persist and there are no contraindications, MRI may provide better evaluation if clinically indicated These results were called by telephone at the time of interpretation on 06/18/2015 at 10:31 pm to nurse Frye-Yarbrough , who verbally acknowledged these results. Electronically Signed   By: Anner Crete M.D.   On: 06/18/2015 22:34    EKG: Independently reviewed. Normal sinus rhythm   Assessment/Plan Active Problems:   Chest pain   Accelerated hypertension   Other chest pain   residual deficit from CVA  Chest pain Admit the patient, monitor under telemetry Serial cardiac enzymes, troponin I every 6 hours 3  Malignant hypertension Patient has history of malignant hypertension, she was on multiple antihypertensive medications which were stopped by [facility] as per patient. Currently she only takes amlodipine and Aldactone  Residual deficit from CVA Patient has left-sided weakness, left facial droop. She was seen by telemetry neurology, and exam findings were inconsistent. No stroke workup recommended by neurology. MRI brain ordered to complete the workup.     Code status: Full code  Family discussion: No family present at bedside   Time Spent on Admission: 60 min   [facility] Pager: [phone] 06/19/2015, 1:10 AM  If 7PM-7AM, please contact night-coverage  [email or web address]  Password [email or web address]

## 2015-06-19 NOTE — ED Notes (Signed)
Dr Darrick Meigs aware of pt's bp, no additional orders given

## 2015-06-19 NOTE — ED Notes (Signed)
Pt ambulatory to restroom with limp noted, tolerated well,

## 2015-06-19 NOTE — Care Management Note (Signed)
Case Management Note  Patient Details  Name: Katrina [name] MRN: [id number] Date of Birth: 01-20-1961  Subjective/Objective:                  Pt is from home and is ind with ADL's. Pt plans to return home with self care and is requesting to leave now. Cardiology consult pending.   Action/Plan: No CM needs.   Expected Discharge Date:     06/20/2015             Expected Discharge Plan:  Home/Self Care  In-House Referral:  NA  Discharge planning Services  CM Consult  Post Acute Care Choice:  NA Choice offered to:  NA  DME Arranged:    DME Agency:     HH Arranged:    HH Agency:     Status of Service:  Completed, signed off  Medicare Important Message Given:    Date Medicare IM Given:    Medicare IM give by:    Date Additional Medicare IM Given:    Additional Medicare Important Message give by:     If discussed at [place] of Stay Meetings, dates discussed:    Additional Comments:  Sherald Barge, RN 06/19/2015, 3:33 PM

## 2015-06-19 NOTE — Progress Notes (Signed)
2104 Patient has Hx of DM2, no current order for accuchecks noted, MD notified.

## 2015-06-19 NOTE — Care Management Obs Status (Signed)
[place] NOTIFICATION   Patient Details  Name: Katrina [name] MRN: [id number] Date of Birth: August 05, 1960   Medicare Observation Status Notification Given:  Yes    Sherald Barge, RN 06/19/2015, 3:33 PM

## 2015-06-19 NOTE — ED Notes (Signed)
Pt given something to drink per her request, reports that the numbness is not any different than when it changed earlier,

## 2015-06-20 DIAGNOSIS — R079 Chest pain, unspecified: Secondary | ICD-10-CM | POA: Diagnosis not present

## 2015-06-20 DIAGNOSIS — I10 Essential (primary) hypertension: Secondary | ICD-10-CM | POA: Diagnosis not present

## 2015-06-20 LAB — GLUCOSE, CAPILLARY: GLUCOSE-CAPILLARY: 96 mg/dL (ref 65–99)

## 2015-06-20 NOTE — Progress Notes (Signed)
Subjective: Patient is resting. She feels better and her chest pain is subsiding.   Objective: Vital signs in last 24 hours: Temp:  [98.2 F (36.8 C)-98.4 F (36.9 C)] 98.3 F (36.8 C) (01/13 0400) Pulse Rate:  [65-84] 84 (01/13 0400) Resp:  [18-20] 20 (01/13 0400) BP: (140-178)/(55-92) 151/71 mmHg (01/13 0400) SpO2:  [93 %-98 %] 98 % (01/13 0400) Weight change:  Last BM Date: 06/18/15  Intake/Output from previous day: 01/12 0701 - 01/13 0700 In: 720 [P.O.:720] Out: -   PHYSICAL EXAM General appearance: alert and no distress Resp: clear to auscultation bilaterally Cardio: S1, S2 normal GI: soft, non-tender; bowel sounds normal; no masses,  no organomegaly Extremities: extremities normal, atraumatic, no cyanosis or edema  Lab Results:  Results for orders placed or performed during the hospital encounter of 06/18/15 (from the past 48 hour(s))  Basic metabolic panel     Status: Abnormal   Collection Time: 06/18/15  9:26 PM  Result Value Ref Range   Sodium 141 135 - 145 mmol/L   Potassium 3.7 3.5 - 5.1 mmol/L   Chloride 108 101 - 111 mmol/L   CO2 24 22 - 32 mmol/L   Glucose, Bld 122 (H) 65 - 99 mg/dL   BUN 19 6 - 20 mg/dL   Creatinine, Ser 0.87 0.44 - 1.00 mg/dL   Calcium 9.1 8.9 - 10.3 mg/dL   GFR calc non Af Amer >60 >60 mL/min   GFR calc Af Amer >60 >60 mL/min    Comment: (NOTE) The eGFR has been calculated using the CKD EPI equation. This calculation has not been validated in all clinical situations. eGFR's persistently <60 mL/min signify possible Chronic Kidney Disease.    Anion gap 9 5 - 15  CBC     Status: Abnormal   Collection Time: 06/18/15  9:26 PM  Result Value Ref Range   WBC 10.9 (H) 4.0 - 10.5 K/uL   RBC 4.60 3.87 - 5.11 MIL/uL   Hemoglobin 13.4 12.0 - 15.0 g/dL   HCT 40.9 36.0 - 46.0 %   MCV 88.9 78.0 - 100.0 fL   MCH 29.1 26.0 - 34.0 pg   MCHC 32.8 30.0 - 36.0 g/dL   RDW 14.2 11.5 - 15.5 %   Platelets 281 150 - 400 K/uL  Troponin I      Status: None   Collection Time: 06/18/15  9:26 PM  Result Value Ref Range   Troponin I <0.03 <0.031 ng/mL    Comment:        NO INDICATION OF MYOCARDIAL INJURY.   Ethanol     Status: None   Collection Time: 06/18/15  9:26 PM  Result Value Ref Range   Alcohol, Ethyl (B) <5 <5 mg/dL    Comment:        LOWEST DETECTABLE LIMIT FOR SERUM ALCOHOL IS 5 mg/dL FOR MEDICAL PURPOSES ONLY   Protime-INR     Status: None   Collection Time: 06/18/15  9:26 PM  Result Value Ref Range   Prothrombin Time 14.6 11.6 - 15.2 seconds   INR 1.12 0.00 - 1.49  APTT     Status: None   Collection Time: 06/18/15  9:26 PM  Result Value Ref Range   aPTT 28 24 - 37 seconds  Differential     Status: Abnormal   Collection Time: 06/18/15  9:26 PM  Result Value Ref Range   Neutrophils Relative % 39 %   Neutro Abs 4.2 1.7 - 7.7 K/uL   Lymphocytes Relative  51 %   Lymphs Abs 5.5 (H) 0.7 - 4.0 K/uL   Monocytes Relative 7 %   Monocytes Absolute 0.7 0.1 - 1.0 K/uL   Eosinophils Relative 2 %   Eosinophils Absolute 0.3 0.0 - 0.7 K/uL   Basophils Relative 1 %   Basophils Absolute 0.1 0.0 - 0.1 K/uL  I-Stat Chem 8, ED  (not at [facility])     Status: None   Collection Time: 06/18/15 10:54 PM  Result Value Ref Range   Sodium 143 135 - 145 mmol/L   Potassium 3.9 3.5 - 5.1 mmol/L   Chloride 105 101 - 111 mmol/L   BUN 18 6 - 20 mg/dL   Creatinine, Ser 0.80 0.44 - 1.00 mg/dL   Glucose, Bld 85 65 - 99 mg/dL   Calcium, Ion 1.19 1.12 - 1.23 mmol/L   TCO2 26 0 - 100 mmol/L   Hemoglobin 14.6 12.0 - 15.0 g/dL   HCT 43.0 36.0 - 46.0 %  Urine rapid drug screen (hosp performed)not at [facility]     Status: Abnormal   Collection Time: 06/19/15 12:09 AM  Result Value Ref Range   Opiates POSITIVE (A) NONE DETECTED   Cocaine NONE DETECTED NONE DETECTED   Benzodiazepines NONE DETECTED NONE DETECTED   Amphetamines NONE DETECTED NONE DETECTED   Tetrahydrocannabinol NONE DETECTED NONE DETECTED   Barbiturates NONE DETECTED NONE  DETECTED    Comment:        DRUG SCREEN FOR MEDICAL PURPOSES ONLY.  IF CONFIRMATION IS NEEDED FOR ANY PURPOSE, NOTIFY LAB WITHIN 5 DAYS.        LOWEST DETECTABLE LIMITS FOR URINE DRUG SCREEN Drug Class       Cutoff (ng/mL) Amphetamine      1000 Barbiturate      200 Benzodiazepine   426 Tricyclics       834 Opiates          300 Cocaine          300 THC              50   Urinalysis, Routine w reflex microscopic (not at [facility])     Status: Abnormal   Collection Time: 06/19/15 12:09 AM  Result Value Ref Range   Color, Urine YELLOW YELLOW   APPearance CLEAR CLEAR   Specific Gravity, Urine 1.025 1.005 - 1.030   pH 6.5 5.0 - 8.0   Glucose, UA NEGATIVE NEGATIVE mg/dL   Hgb urine dipstick MODERATE (A) NEGATIVE   Bilirubin Urine NEGATIVE NEGATIVE   Ketones, ur NEGATIVE NEGATIVE mg/dL   Protein, ur NEGATIVE NEGATIVE mg/dL   Nitrite NEGATIVE NEGATIVE   Leukocytes, UA NEGATIVE NEGATIVE  Urine microscopic-add on     Status: Abnormal   Collection Time: 06/19/15 12:09 AM  Result Value Ref Range   Squamous Epithelial / LPF 0-5 (A) NONE SEEN   WBC, UA 0-5 0 - 5 WBC/hpf   RBC / HPF TOO NUMEROUS TO COUNT 0 - 5 RBC/hpf   Bacteria, UA MANY (A) NONE SEEN  Troponin I (q 6hr x 3)     Status: None   Collection Time: 06/19/15  2:01 AM  Result Value Ref Range   Troponin I <0.03 <0.031 ng/mL    Comment:        NO INDICATION OF MYOCARDIAL INJURY.   Troponin I (q 6hr x 3)     Status: None   Collection Time: 06/19/15  7:36 AM  Result Value Ref Range   Troponin I <0.03 <0.031  ng/mL    Comment:        NO INDICATION OF MYOCARDIAL INJURY.   Troponin I (q 6hr x 3)     Status: None   Collection Time: 06/19/15  7:22 PM  Result Value Ref Range   Troponin I <0.03 <0.031 ng/mL    Comment:        NO INDICATION OF MYOCARDIAL INJURY.     ABGS  Recent Labs  06/18/15 2254  TCO2 26   CULTURES No results found for this or any previous visit (from the past 240 hour(s)). Studies/Results: Dg  Chest 2 View  06/18/2015  CLINICAL DATA:  55 year old female with chest pain EXAM: CHEST  2 VIEW COMPARISON:  Chest radiograph dated 12/24/2014 FINDINGS: The heart size and mediastinal contours are within normal limits. Both lungs are clear. The visualized skeletal structures are unremarkable. IMPRESSION: No active cardiopulmonary disease. Electronically Signed   By: Anner Crete M.D.   On: 06/18/2015 23:08   Ct Head Wo Contrast  06/18/2015  CLINICAL DATA:  55 year old female with left-sided facial numbness x2 hours. Code stroke. EXAM: CT HEAD WITHOUT CONTRAST TECHNIQUE: Contiguous axial images were obtained from the base of the skull through the vertex without intravenous contrast. COMPARISON:  CT dated 01/24/2015 FINDINGS: The ventricles and sulci are appropriate in size for patient's age. Minimal periventricular and deep white matter hypodensities represent chronic microvascular ischemic changes. There is no intracranial hemorrhage. No mass effect or midline shift identified. The visualized paranasal sinuses and mastoid air cells are well aerated. The calvarium is intact. IMPRESSION: No acute intracranial hemorrhage. Mild chronic microvascular ischemic disease. If symptoms persist and there are no contraindications, MRI may provide better evaluation if clinically indicated These results were called by telephone at the time of interpretation on 06/18/2015 at 10:31 pm to nurse Frye-Yarbrough , who verbally acknowledged these results. Electronically Signed   By: Anner Crete M.D.   On: 06/18/2015 22:34    Medications: I have reviewed the patient's current medications.  Assesment:   Active Problems:   Chest pain   Accelerated hypertension   Other chest pain    Plan:  Medications reviewed Continue telemetry Cardiology consult pending      Dexter Signor 06/20/2015, 7:32 AM

## 2015-06-20 NOTE — Progress Notes (Signed)
Patient states understanding of discharge instructions.  

## 2015-06-21 DIAGNOSIS — I10 Essential (primary) hypertension: Secondary | ICD-10-CM | POA: Diagnosis not present

## 2015-06-21 DIAGNOSIS — R079 Chest pain, unspecified: Secondary | ICD-10-CM | POA: Diagnosis not present

## 2015-06-25 ENCOUNTER — Other Ambulatory Visit: Payer: Self-pay | Admitting: Neurology

## 2015-06-25 DIAGNOSIS — M545 Low back pain: Secondary | ICD-10-CM

## 2015-06-25 DIAGNOSIS — R2681 Unsteadiness on feet: Secondary | ICD-10-CM | POA: Diagnosis not present

## 2015-06-25 DIAGNOSIS — I6980 Unspecified sequelae of other cerebrovascular disease: Secondary | ICD-10-CM | POA: Diagnosis not present

## 2015-06-25 DIAGNOSIS — G2581 Restless legs syndrome: Secondary | ICD-10-CM | POA: Diagnosis not present

## 2015-06-25 DIAGNOSIS — G4459 Other complicated headache syndrome: Secondary | ICD-10-CM | POA: Diagnosis not present

## 2015-06-25 DIAGNOSIS — I10 Essential (primary) hypertension: Secondary | ICD-10-CM | POA: Diagnosis not present

## 2015-06-25 DIAGNOSIS — W19XXXA Unspecified fall, initial encounter: Secondary | ICD-10-CM

## 2015-06-25 DIAGNOSIS — F5105 Insomnia due to other mental disorder: Secondary | ICD-10-CM | POA: Diagnosis not present

## 2015-06-27 DIAGNOSIS — G459 Transient cerebral ischemic attack, unspecified: Secondary | ICD-10-CM | POA: Diagnosis not present

## 2015-06-27 DIAGNOSIS — I499 Cardiac arrhythmia, unspecified: Secondary | ICD-10-CM | POA: Diagnosis not present

## 2015-06-27 DIAGNOSIS — E119 Type 2 diabetes mellitus without complications: Secondary | ICD-10-CM | POA: Diagnosis not present

## 2015-06-27 DIAGNOSIS — I10 Essential (primary) hypertension: Secondary | ICD-10-CM | POA: Diagnosis not present

## 2015-06-27 DIAGNOSIS — F17200 Nicotine dependence, unspecified, uncomplicated: Secondary | ICD-10-CM | POA: Diagnosis not present

## 2015-07-01 ENCOUNTER — Encounter (HOSPITAL_COMMUNITY): Payer: Self-pay | Admitting: Emergency Medicine

## 2015-07-01 ENCOUNTER — Inpatient Hospital Stay: Admission: RE | Admit: 2015-07-01 | Payer: Medicaid Other | Source: Ambulatory Visit | Admitting: Internal Medicine

## 2015-07-01 ENCOUNTER — Emergency Department (HOSPITAL_COMMUNITY)
Admission: EM | Admit: 2015-07-01 | Discharge: 2015-07-01 | Disposition: A | Discharge status: Home / Self Care | Payer: Medicare Other | Attending: Emergency Medicine | Admitting: Emergency Medicine

## 2015-07-01 DIAGNOSIS — M79661 Pain in right lower leg: Secondary | ICD-10-CM | POA: Diagnosis not present

## 2015-07-01 DIAGNOSIS — I10 Essential (primary) hypertension: Secondary | ICD-10-CM | POA: Diagnosis not present

## 2015-07-01 DIAGNOSIS — Z880 Allergy status to penicillin: Secondary | ICD-10-CM | POA: Diagnosis not present

## 2015-07-01 DIAGNOSIS — F319 Bipolar disorder, unspecified: Secondary | ICD-10-CM | POA: Diagnosis not present

## 2015-07-01 DIAGNOSIS — Z79899 Other long term (current) drug therapy: Secondary | ICD-10-CM | POA: Diagnosis not present

## 2015-07-01 DIAGNOSIS — Z9889 Other specified postprocedural states: Secondary | ICD-10-CM | POA: Insufficient documentation

## 2015-07-01 DIAGNOSIS — I2510 Atherosclerotic heart disease of native coronary artery without angina pectoris: Secondary | ICD-10-CM | POA: Insufficient documentation

## 2015-07-01 DIAGNOSIS — Z8673 Personal history of transient ischemic attack (TIA), and cerebral infarction without residual deficits: Secondary | ICD-10-CM | POA: Diagnosis not present

## 2015-07-01 DIAGNOSIS — F17210 Nicotine dependence, cigarettes, uncomplicated: Secondary | ICD-10-CM | POA: Diagnosis not present

## 2015-07-01 DIAGNOSIS — E119 Type 2 diabetes mellitus without complications: Secondary | ICD-10-CM | POA: Insufficient documentation

## 2015-07-01 DIAGNOSIS — K219 Gastro-esophageal reflux disease without esophagitis: Secondary | ICD-10-CM | POA: Insufficient documentation

## 2015-07-01 DIAGNOSIS — J449 Chronic obstructive pulmonary disease, unspecified: Secondary | ICD-10-CM | POA: Insufficient documentation

## 2015-07-01 NOTE — ED Notes (Signed)
Pt c/o rt leg pain from knee down to foot. Pt denies any injury.

## 2015-07-01 NOTE — ED Notes (Signed)
Pt states fall a few weeks ago. Pain down her right leg & she says her leg has a cold feeling. Pt took norco around 1400 & a soma @ 1600.

## 2015-07-01 NOTE — Discharge Instructions (Signed)
We are scheduling you for a blood clot study tomorrow. Take your medications at home as prescribed.

## 2015-07-01 NOTE — ED Notes (Signed)
Pt states understanding of care given and follow up instructions.  Ambulated from ED.  Pt very hostile during discharge.  Told pt she has an appointment tomorrow for a CT and an [place] and pt states "yeah we will see about that".  After explaining discharge instructions to pt, asked if she had any questions and pt states "I still don't know what is wrong with my leg and neither do any of you, I should have just stayed at home".  Explained that she may have a blood clot and that we are unfortunately unable to [place] her tonight to determine if that is the cause of her pain.

## 2015-07-02 ENCOUNTER — Other Ambulatory Visit: Payer: Self-pay | Admitting: Neurology

## 2015-07-02 ENCOUNTER — Ambulatory Visit (HOSPITAL_COMMUNITY)
Admission: RE | Admit: 2015-07-02 | Discharge: 2015-07-02 | Disposition: A | Discharge status: Home / Self Care | Payer: Medicare Other | Source: Ambulatory Visit | Attending: Neurology | Admitting: Neurology

## 2015-07-02 DIAGNOSIS — M545 Low back pain: Secondary | ICD-10-CM

## 2015-07-02 DIAGNOSIS — M79661 Pain in right lower leg: Secondary | ICD-10-CM | POA: Diagnosis not present

## 2015-07-02 DIAGNOSIS — M2578 Osteophyte, vertebrae: Secondary | ICD-10-CM | POA: Insufficient documentation

## 2015-07-02 DIAGNOSIS — W19XXXA Unspecified fall, initial encounter: Secondary | ICD-10-CM | POA: Diagnosis not present

## 2015-07-02 DIAGNOSIS — Z981 Arthrodesis status: Secondary | ICD-10-CM | POA: Insufficient documentation

## 2015-07-02 DIAGNOSIS — W19XXXD Unspecified fall, subsequent encounter: Secondary | ICD-10-CM

## 2015-07-02 DIAGNOSIS — M47896 Other spondylosis, lumbar region: Secondary | ICD-10-CM | POA: Diagnosis not present

## 2015-07-02 DIAGNOSIS — G8929 Other chronic pain: Secondary | ICD-10-CM

## 2015-07-02 DIAGNOSIS — M542 Cervicalgia: Secondary | ICD-10-CM | POA: Insufficient documentation

## 2015-07-02 NOTE — ED Provider Notes (Signed)
CSN: [id number]     Arrival date & time 07/01/15  2149 History   First MD Initiated Contact with Patient 07/01/15 2203     Chief Complaint  Patient presents with  . Leg Pain     (Consider location/radiation/quality/duration/timing/severity/associated sxs/prior Treatment) HPI Katrina [name] is a 55 y.o. female who presents to the ED with right lower leg pain. She reports having a fall a few weeks ago and was evaluated at that time and had several x-rays and treated for contusions. She states that her daughter was concerned that she may have a blood clot in her right leg and encouraged the patient to come for evaluation. Patient denies any other concerns tonight and states that she has an appointment 1/25 for CT scans of her back. She is taking muscle relaxants and Percocet at home for pain. Dr. Merlene Laughter is her treating MD.  Past Medical History  Diagnosis Date  . Type 2 diabetes mellitus ([facility])   . Essential hypertension   . Hyperlipidemia   . COPD (chronic obstructive pulmonary disease) ([address])   . GERD (gastroesophageal reflux disease)   . CAD (coronary artery disease)     Moderate nonobstructive 2012-2013, [place]  . Hypothyroid   . Lung nodules   . Diverticulosis   . Bipolar affective ([place])   . Left-sided weakness 03/20/2014    MRI brain negative for stroke.  . 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 Back surgery    . Abdominal hysterectomy    . Appendectomy    . Tonsillectomy    . Ankle surgery    . Cholecystectomy    . Nose surgery    . Cardiac catheterization N/A 01/31/2015    Procedure: Left Heart Cath and Coronary Angiography;  Surgeon: Burnell Blanks, MD;  Location: [facility];  Service: Cardiovascular;  Laterality: N/A;   Family History  Problem Relation Age of Onset  . Coronary artery disease Father 16  . Emphysema Father    Social History  Substance Use Topics  . Smoking status: Current Every  Day Smoker -- 2.00 packs/day for 40 years    Types: Cigarettes    Start date: 03/04/1971  . Smokeless tobacco: Never Used  . Alcohol Use: No   OB History    No data available     Review of Systems Negative except as stated in HPI  Allergies  Penicillins; Pineapple; Strawberry extract; Aspartame and phenylalanine; and Poultry meal  Home Medications   Prior to Admission medications   Medication Sig Start Date End Date Taking? Authorizing Provider  albuterol (PROVENTIL HFA;VENTOLIN HFA) 108 (90 BASE) MCG/ACT inhaler Inhale 2 puffs into the lungs every 6 (six) hours as needed for wheezing or shortness of breath.    Historical Provider, MD  amLODipine (NORVASC) 10 MG tablet Take 1 tablet (10 mg total) by mouth daily. 06/05/14   Herminio Commons, MD  carisoprodol (SOMA) 350 MG tablet Take 350 mg by mouth 4 (four) times daily as needed for muscle spasms.  05/27/14   Historical Provider, MD  dexlansoprazole (DEXILANT) 60 MG capsule Take 1 capsule (60 mg total) by mouth every evening. 03/20/14   Rexene Alberts, MD  EPINEPHrine (EPI-PEN) 0.3 mg/0.3 mL DEVI Inject 0.3 mg into the muscle once as needed (allergic reaction).     Historical Provider, MD  gabapentin (NEURONTIN) 800 MG tablet Take 800 mg by mouth 3 (three) times daily.    Historical Provider, MD  HYDROcodone-acetaminophen (NORCO/VICODIN) 5-325 MG tablet TK 1 T PO BID PRN 06/25/15   Historical Provider, MD  rOPINIRole (REQUIP) 3 MG tablet Take 1 tablet (3 mg total) by mouth at bedtime. 03/20/14   Rexene Alberts, MD  spironolactone (ALDACTONE) 25 MG tablet Take 1 tablet (25 mg total) by mouth daily. 01/30/15   Herminio Commons, MD  traMADol (ULTRAM) 50 MG tablet TK 1 T PO BID PRN 06/27/15   Historical Provider, MD  zolpidem (AMBIEN) 10 MG tablet Take 1 tablet (10 mg total) by mouth at bedtime. 03/20/14   Rexene Alberts, MD   BP 172/94 mmHg  Pulse 77  Temp(Src) 99.1 F (37.3 C) (Temporal)  Resp 18  Ht 5\' 7"  (1.702 m)  Wt 96.616 kg   BMI 33.35 kg/m2  SpO2 98% Physical Exam  Constitutional: She is oriented to person, place, and time. She appears well-developed and well-nourished.  HENT:  Head: Normocephalic.  Eyes: EOM are normal.  Neck: Neck supple.  Cardiovascular: Normal rate and regular rhythm.   Pulmonary/Chest: Effort normal. No respiratory distress. She has no wheezes. She has no rales.  Abdominal: She exhibits no distension.  Musculoskeletal: Normal range of motion.       Right lower leg: She exhibits tenderness. She exhibits no bony tenderness, no swelling, no edema, no deformity and no laceration.  Pedal pulses 2+, adequate circulation, good touch sensation. The calf is soft, no increased warmth, no red streaking. There is full range of motion of the knee and hip.   Neurological: She is alert and oriented to person, place, and time. No cranial nerve deficit.  Skin: Skin is warm and dry.  Psychiatric: She has a normal mood and affect. Her behavior is normal.  Nursing note and vitals reviewed.   ED Course  Procedures (including critical care time) Discussed with the patient Lovenox  And return for doppler study to r/o DVT patient refuses Lovenox but will return for doppler study.   Labs Review Labs Reviewed - No data to display  MDM  55 y.o. female with right lower leg pain s/p fall a few weeks ago stable for d/c without shortness of breath, chest pain, n/v or other symptoms. I discussed this case with Dr. Oleta Mouse and we will not do any further studies at this time. Patient to return in the am for doppler study. Discussed with the patient and all questioned fully answered.    Final diagnoses:  Calf pain, right       [facility], NP 07/02/15 Olin Liu, MD 07/02/15 [phone]

## 2015-07-06 NOTE — Discharge Summary (Signed)
Physician Discharge Summary  Patient ID: Katrina [name] MRN: [id number] DOB/AGE: 08/04/1960 89 y.o. Primary Care Physician:Zunairah Devers, MD Admit date: 06/18/2015 Discharge date: 07/06/2015    Discharge Diagnoses:  Active Problems:   Chest pain   Accelerated hypertension   Other chest pain     Medication List    TAKE these medications        albuterol 108 (90 Base) MCG/ACT inhaler  Commonly known as:  PROVENTIL HFA;VENTOLIN HFA  Inhale 2 puffs into the lungs every 6 (six) hours as needed for wheezing or shortness of breath.     amLODipine 10 MG tablet  Commonly known as:  NORVASC  Take 1 tablet (10 mg total) by mouth daily.     carisoprodol 350 MG tablet  Commonly known as:  SOMA  Take 350 mg by mouth 4 (four) times daily as needed for muscle spasms.     dexlansoprazole 60 MG capsule  Commonly known as:  DEXILANT  Take 1 capsule (60 mg total) by mouth every evening.     EPINEPHrine 0.3 mg/0.3 mL Devi  Commonly known as:  EPI-PEN  Inject 0.3 mg into the muscle once as needed (allergic reaction).     gabapentin 800 MG tablet  Commonly known as:  NEURONTIN  Take 800 mg by mouth 3 (three) times daily.     rOPINIRole 3 MG tablet  Commonly known as:  REQUIP  Take 1 tablet (3 mg total) by mouth at bedtime.     spironolactone 25 MG tablet  Commonly known as:  ALDACTONE  Take 1 tablet (25 mg total) by mouth daily.     zolpidem 10 MG tablet  Commonly known as:  AMBIEN  Take 1 tablet (10 mg total) by mouth at bedtime.        Discharged Condition: improved    Consults: None  Significant Diagnostic Studies: Dg Chest 2 View  06/18/2015  CLINICAL DATA:  55 year old female with chest pain EXAM: CHEST  2 VIEW COMPARISON:  Chest radiograph dated 12/24/2014 FINDINGS: The heart size and mediastinal contours are within normal limits. Both lungs are clear. The visualized skeletal structures are unremarkable. IMPRESSION: No active cardiopulmonary disease. Electronically  Signed   By: Anner Crete M.D.   On: 06/18/2015 23:08   Dg Lumbar Spine Complete  06/13/2015  CLINICAL DATA:  Fall.  Back pain EXAM: LUMBAR SPINE - COMPLETE 4+ VIEW COMPARISON:  07/18/2014 FINDINGS: Pedicle screw and interbody fusion L5-S1 unchanged in position from the prior study Normal lumbar alignment. Negative for fracture. Remaining disc spaces are normal. IMPRESSION: Negative for fracture. Electronically Signed   By: Franchot Gallo M.D.   On: 06/13/2015 16:30   Dg Shoulder Right  06/13/2015  CLINICAL DATA:  [place] down about 10 steps today, right shoulder pain EXAM: RIGHT SHOULDER - 2+ VIEW COMPARISON:  None. FINDINGS: Three views of the right shoulder submitted. No acute fracture or subluxation. No radiopaque foreign body. IMPRESSION: Negative. Electronically Signed   By: Lahoma Crocker M.D.   On: 06/13/2015 16:32   Dg Ankle Complete Right  06/13/2015  CLINICAL DATA:  Fall.  Pain. EXAM: RIGHT ANKLE - COMPLETE 3+ VIEW COMPARISON:  None. FINDINGS: There is no evidence of fracture, dislocation, or joint effusion. There is no evidence of arthropathy or other focal bone abnormality. Soft tissues are unremarkable. Calcification in the distal Achilles tendon. IMPRESSION: Negative. Electronically Signed   By: Franchot Gallo M.D.   On: 06/13/2015 16:26   Ct Head Wo Contrast  07/02/2015  ADDENDUM REPORT: 07/02/2015 12:55 ADDENDUM: For some reason, head CT and cervical spine CT is being linked with the lumbar study. Head CT: The brain has a normal appearance without evidence of malformation, atrophy, old or acute infarction, mass lesion, hemorrhage, hydrocephalus or extra-axial collection. The calvarium is unremarkable. The paranasal sinuses, middle ears and mastoids are clear. Cervical spine CT: No acute or traumatic finding. There is ordinary osteoarthritis at the C1-2 articulation. There is ordinary spondylosis at C4-5 and C5-6 with small endplate osteophytes. Mild foraminal encroachment at those levels  without severe stenosis. There is minimal spondylosis at C6-7 with mild foraminal encroachment on the left. No facet arthropathy of significance. No focal lesion. Head CT: Normal Cervical spine CT: No acute or traumatic finding. Mild spondylosis. Electronically Signed   By: Nelson Chimes M.D.   On: 07/02/2015 12:55  07/02/2015  CLINICAL DATA:  [place] down 12 steps, striking a concrete wall. Injury 2 weeks ago. Low back pain. Previous lumbar fusion. EXAM: CT LUMBAR SPINE WITHOUT CONTRAST TECHNIQUE: Multidetector CT imaging of the lumbar spine was performed without intravenous contrast administration. Multiplanar CT image reconstructions were also generated. COMPARISON:  Radiography 06/12/2014 FINDINGS: Alignment is normal. No evidence of fracture. No significant degenerative change at L 2 3 or above. L3-4: Bulging of the disc, focally prominent in the left extra foraminal region. Mild facet degeneration and ligamentous hypertrophy. No central canal stenosis. Mild foraminal to extra foraminal encroachment on the left. L4-5: Bulging of the disc, focally prominent in the right foraminal to extra foraminal region. Facet degeneration and hypertrophy. No central canal stenosis. Mild foraminal to extra foraminal encroachment on the right. L5-S1: Previous posterior decompression, diskectomy and fusion. Fusion appears solid. Hardware well positioned. No canal or foraminal compromise. Mild osteoarthritis of the sacroiliac joints. IMPRESSION: No acute or traumatic finding. Previous decompression, diskectomy and fusion at L5-S1 has a satisfactory appearance. Degenerative change at the L4-5 level. Foraminal to extra foraminal encroachment on the right because of osteophyte and bulging disc material. Degenerative change at the L3-4 level. Foraminal to extra foraminal encroachment on the left because of osteophyte and bulging disc material. Electronically Signed: By: Nelson Chimes M.D. On: 07/02/2015 10:03   Ct Head Wo  Contrast  06/18/2015  CLINICAL DATA:  55 year old female with left-sided facial numbness x2 hours. Code stroke. EXAM: CT HEAD WITHOUT CONTRAST TECHNIQUE: Contiguous axial images were obtained from the base of the skull through the vertex without intravenous contrast. COMPARISON:  CT dated 01/24/2015 FINDINGS: The ventricles and sulci are appropriate in size for patient's age. Minimal periventricular and deep white matter hypodensities represent chronic microvascular ischemic changes. There is no intracranial hemorrhage. No mass effect or midline shift identified. The visualized paranasal sinuses and mastoid air cells are well aerated. The calvarium is intact. IMPRESSION: No acute intracranial hemorrhage. Mild chronic microvascular ischemic disease. If symptoms persist and there are no contraindications, MRI may provide better evaluation if clinically indicated These results were called by telephone at the time of interpretation on 06/18/2015 at 10:31 pm to nurse Frye-Yarbrough , who verbally acknowledged these results. Electronically Signed   By: Anner Crete M.D.   On: 06/18/2015 22:34   Ct Cervical Spine Wo Contrast  07/02/2015  ADDENDUM REPORT: 07/02/2015 12:55 ADDENDUM: For some reason, head CT and cervical spine CT is being linked with the lumbar study. Head CT: The brain has a normal appearance without evidence of malformation, atrophy, old or acute infarction, mass lesion, hemorrhage, hydrocephalus or extra-axial collection. The calvarium is unremarkable.  The paranasal sinuses, middle ears and mastoids are clear. Cervical spine CT: No acute or traumatic finding. There is ordinary osteoarthritis at the C1-2 articulation. There is ordinary spondylosis at C4-5 and C5-6 with small endplate osteophytes. Mild foraminal encroachment at those levels without severe stenosis. There is minimal spondylosis at C6-7 with mild foraminal encroachment on the left. No facet arthropathy of significance. No focal lesion.  Head CT: Normal Cervical spine CT: No acute or traumatic finding. Mild spondylosis. Electronically Signed   By: Nelson Chimes M.D.   On: 07/02/2015 12:55  07/02/2015  CLINICAL DATA:  [place] down 12 steps, striking a concrete wall. Injury 2 weeks ago. Low back pain. Previous lumbar fusion. EXAM: CT LUMBAR SPINE WITHOUT CONTRAST TECHNIQUE: Multidetector CT imaging of the lumbar spine was performed without intravenous contrast administration. Multiplanar CT image reconstructions were also generated. COMPARISON:  Radiography 06/12/2014 FINDINGS: Alignment is normal. No evidence of fracture. No significant degenerative change at L 2 3 or above. L3-4: Bulging of the disc, focally prominent in the left extra foraminal region. Mild facet degeneration and ligamentous hypertrophy. No central canal stenosis. Mild foraminal to extra foraminal encroachment on the left. L4-5: Bulging of the disc, focally prominent in the right foraminal to extra foraminal region. Facet degeneration and hypertrophy. No central canal stenosis. Mild foraminal to extra foraminal encroachment on the right. L5-S1: Previous posterior decompression, diskectomy and fusion. Fusion appears solid. Hardware well positioned. No canal or foraminal compromise. Mild osteoarthritis of the sacroiliac joints. IMPRESSION: No acute or traumatic finding. Previous decompression, diskectomy and fusion at L5-S1 has a satisfactory appearance. Degenerative change at the L4-5 level. Foraminal to extra foraminal encroachment on the right because of osteophyte and bulging disc material. Degenerative change at the L3-4 level. Foraminal to extra foraminal encroachment on the left because of osteophyte and bulging disc material. Electronically Signed: By: Nelson Chimes M.D. On: 07/02/2015 10:03   Ct Lumbar Spine Wo Contrast  07/02/2015  ADDENDUM REPORT: 07/02/2015 12:55 ADDENDUM: For some reason, head CT and cervical spine CT is being linked with the lumbar study. Head CT: The  brain has a normal appearance without evidence of malformation, atrophy, old or acute infarction, mass lesion, hemorrhage, hydrocephalus or extra-axial collection. The calvarium is unremarkable. The paranasal sinuses, middle ears and mastoids are clear. Cervical spine CT: No acute or traumatic finding. There is ordinary osteoarthritis at the C1-2 articulation. There is ordinary spondylosis at C4-5 and C5-6 with small endplate osteophytes. Mild foraminal encroachment at those levels without severe stenosis. There is minimal spondylosis at C6-7 with mild foraminal encroachment on the left. No facet arthropathy of significance. No focal lesion. Head CT: Normal Cervical spine CT: No acute or traumatic finding. Mild spondylosis. Electronically Signed   By: Nelson Chimes M.D.   On: 07/02/2015 12:55  07/02/2015  CLINICAL DATA:  [place] down 12 steps, striking a concrete wall. Injury 2 weeks ago. Low back pain. Previous lumbar fusion. EXAM: CT LUMBAR SPINE WITHOUT CONTRAST TECHNIQUE: Multidetector CT imaging of the lumbar spine was performed without intravenous contrast administration. Multiplanar CT image reconstructions were also generated. COMPARISON:  Radiography 06/12/2014 FINDINGS: Alignment is normal. No evidence of fracture. No significant degenerative change at L 2 3 or above. L3-4: Bulging of the disc, focally prominent in the left extra foraminal region. Mild facet degeneration and ligamentous hypertrophy. No central canal stenosis. Mild foraminal to extra foraminal encroachment on the left. L4-5: Bulging of the disc, focally prominent in the right foraminal to extra foraminal region. Facet  degeneration and hypertrophy. No central canal stenosis. Mild foraminal to extra foraminal encroachment on the right. L5-S1: Previous posterior decompression, diskectomy and fusion. Fusion appears solid. Hardware well positioned. No canal or foraminal compromise. Mild osteoarthritis of the sacroiliac joints. IMPRESSION: No acute  or traumatic finding. Previous decompression, diskectomy and fusion at L5-S1 has a satisfactory appearance. Degenerative change at the L4-5 level. Foraminal to extra foraminal encroachment on the right because of osteophyte and bulging disc material. Degenerative change at the L3-4 level. Foraminal to extra foraminal encroachment on the left because of osteophyte and bulging disc material. Electronically Signed: By: Nelson Chimes M.D. On: 07/02/2015 10:03   US Venous Img Lower Unilateral Right  07/02/2015  CLINICAL DATA:  Right calf pain for 1 day EXAM: Right LOWER EXTREMITY VENOUS DOPPLER ULTRASOUND TECHNIQUE: Gray-scale sonography with graded compression, as well as color Doppler and duplex ultrasound were performed to evaluate the lower extremity deep venous systems from the level of the common femoral vein and including the common femoral, femoral, profunda femoral, popliteal and calf veins including the posterior tibial, peroneal and gastrocnemius veins when visible. The superficial great saphenous vein was also interrogated. Spectral Doppler was utilized to evaluate flow at rest and with distal augmentation maneuvers in the common femoral, femoral and popliteal veins. COMPARISON:  None. FINDINGS: Contralateral Common Femoral Vein: Respiratory phasicity is normal and symmetric with the symptomatic side. No evidence of thrombus. Normal compressibility. Common Femoral Vein: No evidence of thrombus. Normal compressibility, respiratory phasicity and response to augmentation. Saphenofemoral Junction: No evidence of thrombus. Normal compressibility and flow on color Doppler imaging. Profunda Femoral Vein: No evidence of thrombus. Normal compressibility and flow on color Doppler imaging. Femoral Vein: No evidence of thrombus. Normal compressibility, respiratory phasicity and response to augmentation. Popliteal Vein: No evidence of thrombus. Normal compressibility, respiratory phasicity and response to augmentation.  Calf Veins: No evidence of thrombus. Normal compressibility and flow on color Doppler imaging. Superficial Great Saphenous Vein: No evidence of thrombus. Normal compressibility and flow on color Doppler imaging. Venous Reflux:  None. Other Findings:  None. IMPRESSION: No evidence of deep venous thrombosis. Electronically Signed   By: Inez Catalina M.D.   On: 07/02/2015 10:15   Dg Knee Complete 4 Views Right  06/13/2015  CLINICAL DATA:  [place] down 10 stairs today.  Anterior knee pain EXAM: RIGHT KNEE - COMPLETE 4+ VIEW COMPARISON:  None. FINDINGS: No fracture of the proximal tibia or distal femur. Patella is normal. No joint effusion. IMPRESSION: No fracture or dislocation. Electronically Signed   By: Suzy Bouchard M.D.   On: 06/13/2015 16:28   Dg Foot Complete Right  06/13/2015  CLINICAL DATA:  Stop She fell down about 10 steps today. Pain lateral side of foot. Hx of multiple fx's in the 90'x and early 2000. EXAM: RIGHT FOOT COMPLETE - 3+ VIEW COMPARISON:  None. FINDINGS: No fracture or dislocation of mid foot or forefoot. The phalanges are normal. The calcaneus is normal. No soft tissue abnormality. IMPRESSION: No fracture or dislocation. Electronically Signed   By: Suzy Bouchard M.D.   On: 06/13/2015 16:34    Lab Results: Basic Metabolic Panel: No results for input(s): NA, K, CL, CO2, GLUCOSE, BUN, CREATININE, CALCIUM, MG, PHOS in the last 72 hours. Liver Function Tests: No results for input(s): AST, ALT, ALKPHOS, BILITOT, PROT, ALBUMIN in the last 72 hours.   CBC: No results for input(s): WBC, NEUTROABS, HGB, HCT, MCV, PLT in the last 72 hours.  No results found for this  or any previous visit (from the past 240 hour(s)).   Hospital Course:   This is a 55 years old female with history of uncontrolled hypertension was admitted due tyo chest pain. She had serial cardiac enzyme and EKG which was negative. Patient is being followed in [place] [facility] for her hypertension and TIA. She had also  carrdiac work up including cardiac cath which was negative.   Discharge Exam: Blood pressure 185/88, pulse 72, temperature 97.9 F (36.6 C), temperature source Oral, resp. rate 20, height 5\' 7"  (1.702 m), weight 96.117 kg (211 lb 14.4 oz), SpO2 97 %.    Disposition:  Home         Follow-up Information    Follow up with [facility], MD On 06/27/2015.   Specialty:  Internal Medicine   Why:  10am   Contact information:   [place]  [id number] [phone]       Signed: Rosita Fire   07/06/2015, 10:33 AM

## 2015-07-07 DIAGNOSIS — G4459 Other complicated headache syndrome: Secondary | ICD-10-CM | POA: Diagnosis not present

## 2015-07-07 DIAGNOSIS — G2581 Restless legs syndrome: Secondary | ICD-10-CM | POA: Diagnosis not present

## 2015-07-07 DIAGNOSIS — M4726 Other spondylosis with radiculopathy, lumbar region: Secondary | ICD-10-CM | POA: Diagnosis not present

## 2015-07-07 DIAGNOSIS — I10 Essential (primary) hypertension: Secondary | ICD-10-CM | POA: Diagnosis not present

## 2015-07-07 DIAGNOSIS — Z79899 Other long term (current) drug therapy: Secondary | ICD-10-CM | POA: Diagnosis not present

## 2015-07-07 DIAGNOSIS — I6980 Unspecified sequelae of other cerebrovascular disease: Secondary | ICD-10-CM | POA: Diagnosis not present

## 2015-07-07 DIAGNOSIS — M25511 Pain in right shoulder: Secondary | ICD-10-CM | POA: Diagnosis not present

## 2015-07-07 DIAGNOSIS — G4701 Insomnia due to medical condition: Secondary | ICD-10-CM | POA: Diagnosis not present

## 2015-07-09 DIAGNOSIS — I10 Essential (primary) hypertension: Secondary | ICD-10-CM | POA: Diagnosis not present

## 2015-07-09 DIAGNOSIS — Z6833 Body mass index (BMI) 33.0-33.9, adult: Secondary | ICD-10-CM | POA: Diagnosis not present

## 2015-07-09 DIAGNOSIS — M5441 Lumbago with sciatica, right side: Secondary | ICD-10-CM | POA: Diagnosis not present

## 2015-07-17 DIAGNOSIS — M5441 Lumbago with sciatica, right side: Secondary | ICD-10-CM | POA: Diagnosis not present

## 2015-07-17 DIAGNOSIS — M6281 Muscle weakness (generalized): Secondary | ICD-10-CM | POA: Diagnosis not present

## 2015-07-17 DIAGNOSIS — M545 Low back pain: Secondary | ICD-10-CM | POA: Diagnosis not present

## 2015-07-17 DIAGNOSIS — R262 Difficulty in walking, not elsewhere classified: Secondary | ICD-10-CM | POA: Diagnosis not present

## 2015-07-20 ENCOUNTER — Emergency Department (HOSPITAL_COMMUNITY): Payer: Medicare Other

## 2015-07-20 ENCOUNTER — Emergency Department (HOSPITAL_COMMUNITY)
Admission: EM | Admit: 2015-07-20 | Discharge: 2015-07-21 | Disposition: A | Discharge status: Home / Self Care | Payer: Medicare Other | Attending: Emergency Medicine | Admitting: Emergency Medicine

## 2015-07-20 ENCOUNTER — Encounter (HOSPITAL_COMMUNITY): Payer: Self-pay | Admitting: Emergency Medicine

## 2015-07-20 DIAGNOSIS — F17210 Nicotine dependence, cigarettes, uncomplicated: Secondary | ICD-10-CM | POA: Insufficient documentation

## 2015-07-20 DIAGNOSIS — E119 Type 2 diabetes mellitus without complications: Secondary | ICD-10-CM | POA: Insufficient documentation

## 2015-07-20 DIAGNOSIS — F444 Conversion disorder with motor symptom or deficit: Secondary | ICD-10-CM

## 2015-07-20 DIAGNOSIS — I10 Essential (primary) hypertension: Secondary | ICD-10-CM | POA: Diagnosis not present

## 2015-07-20 DIAGNOSIS — M6281 Muscle weakness (generalized): Secondary | ICD-10-CM | POA: Diagnosis not present

## 2015-07-20 DIAGNOSIS — R29810 Facial weakness: Secondary | ICD-10-CM

## 2015-07-20 DIAGNOSIS — R531 Weakness: Secondary | ICD-10-CM | POA: Diagnosis present

## 2015-07-20 DIAGNOSIS — G4489 Other headache syndrome: Secondary | ICD-10-CM | POA: Diagnosis not present

## 2015-07-20 DIAGNOSIS — R51 Headache: Secondary | ICD-10-CM | POA: Diagnosis not present

## 2015-07-20 DIAGNOSIS — Z7984 Long term (current) use of oral hypoglycemic drugs: Secondary | ICD-10-CM | POA: Insufficient documentation

## 2015-07-20 DIAGNOSIS — G839 Paralytic syndrome, unspecified: Secondary | ICD-10-CM | POA: Diagnosis not present

## 2015-07-20 DIAGNOSIS — E785 Hyperlipidemia, unspecified: Secondary | ICD-10-CM | POA: Insufficient documentation

## 2015-07-20 DIAGNOSIS — I6789 Other cerebrovascular disease: Secondary | ICD-10-CM | POA: Diagnosis not present

## 2015-07-20 LAB — CBC
HCT: 40.7 % (ref 36.0–46.0)
Hemoglobin: 13.5 g/dL (ref 12.0–15.0)
MCH: 29.3 pg (ref 26.0–34.0)
MCHC: 33.2 g/dL (ref 30.0–36.0)
MCV: 88.5 fL (ref 78.0–100.0)
PLATELETS: 267 10*3/uL (ref 150–400)
RBC: 4.6 MIL/uL (ref 3.87–5.11)
RDW: 14.6 % (ref 11.5–15.5)
WBC: 11.3 10*3/uL — ABNORMAL HIGH (ref 4.0–10.5)

## 2015-07-20 LAB — I-STAT TROPONIN, ED: TROPONIN I, POC: 0 ng/mL (ref 0.00–0.08)

## 2015-07-20 LAB — DIFFERENTIAL
Basophils Absolute: 0 10*3/uL (ref 0.0–0.1)
Basophils Relative: 0 %
EOS ABS: 0.2 10*3/uL (ref 0.0–0.7)
EOS PCT: 1 %
LYMPHS ABS: 5.3 10*3/uL — AB (ref 0.7–4.0)
Lymphocytes Relative: 47 %
Monocytes Absolute: 0.9 10*3/uL (ref 0.1–1.0)
Monocytes Relative: 8 %
NEUTROS PCT: 44 %
Neutro Abs: 5 10*3/uL (ref 1.7–7.7)

## 2015-07-20 LAB — COMPREHENSIVE METABOLIC PANEL
ALBUMIN: 3.8 g/dL (ref 3.5–5.0)
ALT: 12 U/L — ABNORMAL LOW (ref 14–54)
AST: 16 U/L (ref 15–41)
Alkaline Phosphatase: 81 U/L (ref 38–126)
Anion gap: 6 (ref 5–15)
BUN: 19 mg/dL (ref 6–20)
CHLORIDE: 112 mmol/L — AB (ref 101–111)
CO2: 22 mmol/L (ref 22–32)
Calcium: 8.6 mg/dL — ABNORMAL LOW (ref 8.9–10.3)
Creatinine, Ser: 0.77 mg/dL (ref 0.44–1.00)
GFR calc Af Amer: >60 mL/min (ref >60)
GLUCOSE: 85 mg/dL (ref 65–99)
POTASSIUM: 3.5 mmol/L (ref 3.5–5.1)
Sodium: 140 mmol/L (ref 135–145)
Total Bilirubin: 0.4 mg/dL (ref 0.3–1.2)
Total Protein: 7 g/dL (ref 6.5–8.1)

## 2015-07-20 LAB — PROTIME-INR
INR: 1.08 (ref 0.00–1.49)
PROTHROMBIN TIME: 14.2 s (ref 11.6–15.2)

## 2015-07-20 LAB — I-STAT CHEM 8, ED
BUN: 23 mg/dL — ABNORMAL HIGH (ref 6–20)
Calcium, Ion: 1.12 mmol/L (ref 1.12–1.23)
Chloride: 108 mmol/L (ref 101–111)
Creatinine, Ser: 0.7 mg/dL (ref 0.44–1.00)
Glucose, Bld: 81 mg/dL (ref 65–99)
HEMATOCRIT: 40 % (ref 36.0–46.0)
HEMOGLOBIN: 13.6 g/dL (ref 12.0–15.0)
Potassium: 4 mmol/L (ref 3.5–5.1)
SODIUM: 143 mmol/L (ref 135–145)
TCO2: 24 mmol/L (ref 0–100)

## 2015-07-20 LAB — APTT: aPTT: 29 seconds (ref 24–37)

## 2015-07-20 LAB — ETHANOL

## 2015-07-20 MED ORDER — METOCLOPRAMIDE HCL 5 MG/ML IJ SOLN
10.0000 mg | Freq: Once | INTRAMUSCULAR | Status: AC
  Administered 2015-07-21: 10 mg via INTRAVENOUS
  Filled 2015-07-20: qty 2

## 2015-07-20 MED ORDER — DEXAMETHASONE SODIUM PHOSPHATE 10 MG/ML IJ SOLN
10.0000 mg | Freq: Once | INTRAMUSCULAR | Status: DC
  Filled 2015-07-20: qty 1

## 2015-07-20 MED ORDER — KETOROLAC TROMETHAMINE 30 MG/ML IJ SOLN
30.0000 mg | Freq: Once | INTRAMUSCULAR | Status: AC
  Administered 2015-07-21: 30 mg via INTRAVENOUS
  Filled 2015-07-20: qty 1

## 2015-07-20 MED ORDER — LORAZEPAM 2 MG/ML IJ SOLN
INTRAMUSCULAR | Status: AC
Start: 2015-07-20 — End: 2015-07-20
  Administered 2015-07-20: 0.25 mg
  Filled 2015-07-20: qty 1

## 2015-07-20 MED ORDER — DIVALPROEX SODIUM 250 MG PO DR TAB: 500.0000 mg | DELAYED_RELEASE_TABLET | Freq: Two times a day (BID) | ORAL | Status: DC

## 2015-07-20 NOTE — ED Provider Notes (Signed)
  Physical Exam  Ht '5\' 7"'$  (1.702 m)  Wt 216 lb (97.977 kg)  BMI 33.82 kg/m2  Physical Exam  ED Course  Procedures  MDM Met patient at bridge with neurology. Transfer from [facility]  for weakness. Reportedly has history of same. Has had negative MRIs in the past. Transferred to see neurology. Taken urgently to MRI with neurology.      Davonna Belling, MD 07/20/15 2215

## 2015-07-20 NOTE — ED Notes (Signed)
Pt to Trauma B from MRI

## 2015-07-20 NOTE — ED Notes (Addendum)
Pt from home with weakness and facial droop that started around 2000. Pt states she has a headache in the left side of her head that began around the same time. She said it started as a sharp pain and then she felt a brust. Pt is currently refusing an IV in her hand

## 2015-07-20 NOTE — Consult Note (Signed)
Requesting Physician: Dr.  Alvino Chapel    Reason for consultation: Evaluate for acute stroke  HPI:                                                                                                                                         Katrina [name] is an 55 y.o. female patient who presented to the [facility] ER with severe headache, symptoms of subjective left-sided weakness and left facial droop. Patient had multiple prior ER visits for similar symptoms of left-sided weakness and facial droop in October 2015, December 2015, July 2016 . She had MRI of the brain done during all 3 of these visits, and it was negative for any acute infarct. Patient reported that she had severe headache earlier today at about 8:00, had a pop in her head and happily developed left facial and left upper and lower extremity weakness since then.  Date last known well:  07/19/14 Time last known well:  2000 tPA Given: No:  Not acute stroke  Stroke Risk Factors - diabetes mellitus and hypertension  Past Medical History: Past Medical History  Diagnosis Date  . Type 2 diabetes mellitus ([place])   . Essential hypertension   . Hyperlipidemia   . COPD (chronic obstructive pulmonary disease) ([place])   . GERD (gastroesophageal reflux disease)   . CAD (coronary artery disease)     Moderate nonobstructive 2012-2013, [place]  . Hypothyroid   . Lung nodules   . Diverticulosis   . Bipolar affective ([place])   . Left-sided weakness 03/20/2014    MRI brain negative for stroke.  . 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 Back surgery    . Abdominal hysterectomy    . Appendectomy    . Tonsillectomy    . Ankle surgery    . Cholecystectomy    . Nose surgery    . Cardiac catheterization N/A 01/31/2015    Procedure: Left Heart Cath and Coronary Angiography;  Surgeon: Burnell Blanks, MD;  Location: [facility];  Service: Cardiovascular;  Laterality: N/A;     Family History: Family History  Problem Relation Age of Onset  . Coronary artery disease Father 71  . Emphysema Father     Social History:   reports that she has been smoking Cigarettes.  She started smoking about 44 years ago. She has a 80 pack-year smoking history. She has never used smokeless tobacco. She reports that she does not drink alcohol or use illicit drugs.  Allergies:  Allergies  Allergen Reactions  . Penicillins Anaphylaxis    Has patient had a PCN reaction causing immediate rash, facial/tongue/throat swelling, SOB or lightheadedness with hypotension: Yes Has patient had a PCN reaction causing severe rash involving mucus membranes or skin necrosis: Yes Has patient had a PCN reaction that required hospitalization Yes Has patient had a PCN  reaction occurring within the last 10 years: No If all of the above answers are "NO", then may proceed with Cephalosporin use.   Marland Kitchen Pineapple Rash  . Strawberry Extract Rash  . Aspartame And Phenylalanine Palpitations  . Poultry Meal Other (See Comments)    Allergic to [place]     Medications:                                                                                                                         Current facility-administered medications:  .  LORazepam (ATIVAN) 2 MG/ML injection, , , ,   Current outpatient prescriptions:  .  albuterol (PROVENTIL HFA;VENTOLIN HFA) 108 (90 BASE) MCG/ACT inhaler, Inhale 2 puffs into the lungs every 6 (six) hours as needed for wheezing or shortness of breath., Disp: , Rfl:  .  amLODipine (NORVASC) 10 MG tablet, Take 1 tablet (10 mg total) by mouth daily., Disp: 30 tablet, Rfl: 6 .  carisoprodol (SOMA) 350 MG tablet, Take 350 mg by mouth 4 (four) times daily as needed for muscle spasms. , Disp: , Rfl: 3 .  dexlansoprazole (DEXILANT) 60 MG capsule, Take 1 capsule (60 mg total) by mouth every evening., Disp: 30 capsule, Rfl: 1 .  EPINEPHrine (EPI-PEN) 0.3 mg/0.3 mL DEVI, Inject 0.3 mg  into the muscle once as needed (allergic reaction). , Disp: , Rfl:  .  gabapentin (NEURONTIN) 800 MG tablet, Take 800 mg by mouth 3 (three) times daily., Disp: , Rfl:  .  HYDROcodone-acetaminophen (NORCO/VICODIN) 5-325 MG tablet, TK 1 T PO BID PRN, Disp: , Rfl: 0 .  rOPINIRole (REQUIP) 3 MG tablet, Take 1 tablet (3 mg total) by mouth at bedtime., Disp: 30 tablet, Rfl: 0 .  spironolactone (ALDACTONE) 25 MG tablet, Take 1 tablet (25 mg total) by mouth daily., Disp: 30 tablet, Rfl: 6 .  traMADol (ULTRAM) 50 MG tablet, TK 1 T PO BID PRN, Disp: , Rfl: 0 .  zolpidem (AMBIEN) 10 MG tablet, Take 1 tablet (10 mg total) by mouth at bedtime., Disp: 30 tablet, Rfl: 0   ROS:                                                                                                                                       History obtained from the patient  General ROS: negative for - chills, fatigue, fever, night sweats, weight gain or weight loss  Psychological ROS: negative for - behavioral disorder, hallucinations, memory difficulties, mood swings or suicidal ideation Ophthalmic ROS: negative for - blurry vision, double vision, eye pain or loss of vision ENT ROS: negative for - epistaxis, nasal discharge, oral lesions, sore throat, tinnitus or vertigo Allergy and Immunology ROS: negative for - hives or itchy/watery eyes Hematological and Lymphatic ROS: negative for - bleeding problems, bruising or swollen lymph nodes Endocrine ROS: negative for - galactorrhea, hair pattern changes, polydipsia/polyuria or temperature intolerance Respiratory ROS: negative for - cough, hemoptysis, shortness of breath or wheezing Cardiovascular ROS: negative for - chest pain, dyspnea on exertion, edema or irregular heartbeat Gastrointestinal ROS: negative for - abdominal pain, diarrhea, hematemesis, nausea/vomiting or stool incontinence Genito-Urinary ROS: negative for - dysuria, hematuria, incontinence or urinary  frequency/urgency Musculoskeletal ROS: negative for - joint swelling or muscular weakness Neurological ROS: as noted in HPI Dermatological ROS: negative for rash and skin lesion changes  Neurologic Examination:                                                                                                      Height 5\' 7"  (1.702 m), weight 97.977 kg (216 lb).  Evaluation of higher integrative functions including: Level of alertness: Alert, appears to be in distress from headache Oriented to time, place and person Speech: fluent, no evidence of dysarthria or aphasia noted.  Test the following cranial nerves: 2-12 grossly intact Motor examination: Inconsistent exam with a giveaway weakness on the left upper and lower extremities with poor effort , intermittently able to demonstrate full strength on the left side . Also had left facial weakness on exam which was certainly a conversion disorder , had symmetric normal movement on the left side when she became emotional and with grimacing , able to move her left upper extremity equal to right side when she was slightly agitated during the MRI study  Examination of sensation : Subjectively reports reduced sensation to pinprick in left upper and lower extremities , unreliable  Examination of deep tendon reflexes: 2+, normal and symmetric in all extremities, normal plantars bilaterally Test coordination: Normal finger nose testing, with no evidence of limb appendicular ataxia or abnormal involuntary movements or tremors noted.  Gait: Deferred   Lab Results: Basic Metabolic Panel:  Recent Labs Lab 07/20/15 2151  NA 143  K 4.0  CL 108  GLUCOSE 81  BUN 23*  CREATININE 0.70    Liver Function Tests: No results for input(s): AST, ALT, ALKPHOS, BILITOT, PROT, ALBUMIN in the last 168 hours. No results for input(s): LIPASE, AMYLASE in the last 168 hours. No results for input(s): AMMONIA in the last 168 hours.  CBC:  Recent Labs Lab  07/20/15 2139 07/20/15 2151  WBC 11.3*  --   NEUTROABS 5.0  --   HGB 13.5 13.6  HCT 40.7 40.0  MCV 88.5  --   PLT 267  --     Cardiac Enzymes: No results for input(s): CKTOTAL, CKMB, CKMBINDEX, TROPONINI in the last 168 hours.  Lipid Panel: No results for input(s): CHOL, TRIG, HDL, CHOLHDL, VLDL, LDLCALC  in the last 168 hours.  CBG: No results for input(s): GLUCAP in the last 168 hours.  Microbiology: Results for orders placed or performed during the hospital encounter of 03/18/14  MRSA PCR Screening     Status: None   Collection Time: 03/19/14  1:30 AM  Result Value Ref Range Status   MRSA by PCR NEGATIVE NEGATIVE Final    Comment:        The GeneXpert MRSA Assay (FDA approved for NASAL specimens only), is one component of a comprehensive MRSA colonization surveillance program. It is not intended to diagnose MRSA infection nor to guide or monitor treatment for MRSA infections.     Imaging:  A stat limited MRI of the brain with DWI and GRE sequences were performed, no evidence of acute infarct or hemorrhage noted.  Assessment and plan:   GORGEOUS JANISSE is an 55 y.o. female patient who presented with subjective symptoms of left facial droop, left upper extremity and lower extremity weakness. She has a history of multiple prior ER visits with similar symptoms, with negative MRI studies at that time. Today's examination is also inconsistent with poor effort and giveaway weakness on the left side. Due to the inconsistencies in the clinical exam, a stat MRI of the brain with DWI and GRE sequences were performed, which did not show any evidence of acute infarct or hemorrhage noted. After I informed the patient about the results, she gradually able to demonstrate symmetric strength on the left side and improved facial movements on the left. Her clinical presentation with negative brain MRI is consistent with conversion disorder. As noted above, she had similar multiple  presentations in the past with negative MRI studies.  No indication for any further neurodiagnostic testing at this point. Discussed with ER physician.

## 2015-07-20 NOTE — ED Notes (Signed)
Patient to MRI with RR RN

## 2015-07-20 NOTE — ED Provider Notes (Signed)
CSN: [id number]     Arrival date & time 07/20/15  2110 History   First MD Initiated Contact with Patient 07/20/15 2124     Chief Complaint  Patient presents with  . Extremity Weakness     (Consider location/radiation/quality/duration/timing/severity/associated sxs/prior Treatment) HPI Patient presents with acute onset left-sided weakness that started at 2:00. She also notes left-sided facial droop and headache on the left side beginning at the same time. Describes the headache as sharp. Patient states she's had a history of stroke in the past with left-sided weakness that is resolved. Past Medical History  Diagnosis Date  . Type 2 diabetes mellitus ([place])   . Essential hypertension   . Hyperlipidemia   . COPD (chronic obstructive pulmonary disease) ([place])   . GERD (gastroesophageal reflux disease)   . CAD (coronary artery disease)     Moderate nonobstructive 2012-2013, [place]  . Hypothyroid   . Lung nodules   . Diverticulosis   . Bipolar affective ([place])   . Left-sided weakness 03/20/2014    MRI brain negative for stroke.  . 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 Back surgery    . Abdominal hysterectomy    . Appendectomy    . Tonsillectomy    . Ankle surgery    . Cholecystectomy    . Nose surgery    . Cardiac catheterization N/A 01/31/2015    Procedure: Left Heart Cath and Coronary Angiography;  Surgeon: Burnell Blanks, MD;  Location: [place] [facility];  Service: Cardiovascular;  Laterality: N/A;   Family History  Problem Relation Age of Onset  . Coronary artery disease Father 35  . Emphysema Father    Social History  Substance Use Topics  . Smoking status: Current Every Day Smoker -- 2.00 packs/day for 40 years    Types: Cigarettes    Start date: 03/04/1971  . Smokeless tobacco: Never Used  . Alcohol Use: No   OB History    No data available     Review of Systems  Constitutional: Negative for fever  and chills.  Respiratory: Negative for shortness of breath.   Cardiovascular: Positive for chest pain.  Gastrointestinal: Negative for nausea, vomiting, abdominal pain and diarrhea.  Musculoskeletal: Negative for myalgias, back pain, neck pain and neck stiffness.  Skin: Negative for rash and wound.  Neurological: Positive for headaches. Negative for light-headedness and numbness.  All other systems reviewed and are negative.     Allergies  Penicillins; Pineapple; Strawberry extract; Aspartame and phenylalanine; and Poultry meal  Home Medications   Prior to Admission medications   Medication Sig Start Date End Date Taking? Authorizing Provider  albuterol (PROVENTIL HFA;VENTOLIN HFA) 108 (90 BASE) MCG/ACT inhaler Inhale 2 puffs into the lungs every 6 (six) hours as needed for wheezing or shortness of breath.    Historical Provider, MD  amLODipine (NORVASC) 10 MG tablet Take 1 tablet (10 mg total) by mouth daily. 06/05/14   Herminio Commons, MD  carisoprodol (SOMA) 350 MG tablet Take 350 mg by mouth 4 (four) times daily as needed for muscle spasms.  05/27/14   Historical Provider, MD  dexlansoprazole (DEXILANT) 60 MG capsule Take 1 capsule (60 mg total) by mouth every evening. 03/20/14   Rexene Alberts, MD  EPINEPHrine (EPI-PEN) 0.3 mg/0.3 mL DEVI Inject 0.3 mg into the muscle once as needed (allergic reaction).     Historical Provider, MD  gabapentin (NEURONTIN) 800 MG tablet Take 800 mg by mouth  3 (three) times daily.    Historical Provider, MD  HYDROcodone-acetaminophen (NORCO/VICODIN) 5-325 MG tablet TK 1 T PO BID PRN 06/25/15   Historical Provider, MD  rOPINIRole (REQUIP) 3 MG tablet Take 1 tablet (3 mg total) by mouth at bedtime. 03/20/14   Rexene Alberts, MD  spironolactone (ALDACTONE) 25 MG tablet Take 1 tablet (25 mg total) by mouth daily. 01/30/15   Herminio Commons, MD  traMADol (ULTRAM) 50 MG tablet TK 1 T PO BID PRN 06/27/15   Historical Provider, MD  zolpidem (AMBIEN) 10 MG  tablet Take 1 tablet (10 mg total) by mouth at bedtime. 03/20/14   Rexene Alberts, MD   Ht 5\' 7"  (1.702 m)  Wt 216 lb (97.977 kg)  BMI 33.82 kg/m2 Physical Exam  Constitutional: She is oriented to person, place, and time. She appears well-developed and well-nourished. No distress.  HENT:  Head: Normocephalic and atraumatic.  Mouth/Throat: Oropharynx is clear and moist. No oropharyngeal exudate.  Eyes: EOM are normal. Pupils are equal, round, and reactive to light.  Neck: Normal range of motion. Neck supple. No JVD present.  Cardiovascular: Normal rate and regular rhythm.  Exam reveals no gallop and no friction rub.   No murmur heard. Pulmonary/Chest: Effort normal and breath sounds normal. No respiratory distress. She has no wheezes. She has no rales. She exhibits no tenderness.  Abdominal: Soft. Bowel sounds are normal. She exhibits no distension and no mass. There is no tenderness. There is no rebound and no guarding.  Musculoskeletal: Normal range of motion. She exhibits no edema or tenderness.  Neurological: She is alert and oriented to person, place, and time.  Patient neurologic exam is inconsistent. She appears to be freely using the left hand with asked to raise it has drift and left-sided grip weakness compared to the right. Patient also has left lower face droop though when agitated with nursing staff force trying to start an IV, droop appears to appear. Patient has no appreciable numbness. Exam is very effort dependent.  Skin: Skin is warm and dry. No rash noted. No erythema.  Nursing note and vitals reviewed.   ED Course  Procedures (including critical care time) Labs Review Labs Reviewed  CBC - Abnormal; Notable for the following:    WBC 11.3 (*)    All other components within normal limits  DIFFERENTIAL - Abnormal; Notable for the following:    Lymphs Abs 5.3 (*)    All other components within normal limits  I-STAT CHEM 8, ED - Abnormal; Notable for the following:     BUN 23 (*)    All other components within normal limits  ETHANOL  PROTIME-INR  APTT  COMPREHENSIVE METABOLIC PANEL  URINE RAPID DRUG SCREEN, HOSP PERFORMED  URINALYSIS, ROUTINE W REFLEX MICROSCOPIC (NOT AT [facility])  I-STAT TROPOININ, ED    Imaging Review No results found. I have personally reviewed and evaluated these images and lab results as part of my medical decision-making.   EKG Interpretation   Date/Time:  Sunday July 20 2015 21:22:19 EST Ventricular Rate:  91 PR Interval:  167 QRS Duration: 95 QT Interval:  368 QTC Calculation: 453 R Axis:   50 Text Interpretation:  Sinus rhythm Probable left atrial enlargement  Confirmed by Lita Mains  MD, Saga Balthazar ([id number]) on 07/20/2015 9:48:44 PM      MDM   Final diagnoses:  Left-sided weakness    Code stroke called due to new onset left-sided weakness. Reviewed previous MRIs and CTs with no evidence of prior stroke.  Patient's neurologic exam is inconsistent. Discussed with neurologist and he will evaluate the patient in the emergency department of her [place]. Discussed with Dr. Alvino Chapel and he has except the patient.    Julianne Rice, MD 07/20/15 2207

## 2015-07-21 DIAGNOSIS — G4489 Other headache syndrome: Secondary | ICD-10-CM | POA: Diagnosis not present

## 2015-07-21 LAB — URINE MICROSCOPIC-ADD ON

## 2015-07-21 LAB — URINALYSIS, ROUTINE W REFLEX MICROSCOPIC
Bilirubin Urine: NEGATIVE
GLUCOSE, UA: NEGATIVE mg/dL
Ketones, ur: NEGATIVE mg/dL
LEUKOCYTES UA: NEGATIVE
Nitrite: NEGATIVE
PH: 6.5 (ref 5.0–8.0)
PROTEIN: NEGATIVE mg/dL
Specific Gravity, Urine: 1.014 (ref 1.005–1.030)

## 2015-07-21 LAB — RAPID URINE DRUG SCREEN, HOSP PERFORMED
Amphetamines: NOT DETECTED
BARBITURATES: NOT DETECTED
BENZODIAZEPINES: NOT DETECTED
COCAINE: NOT DETECTED
OPIATES: NOT DETECTED
TETRAHYDROCANNABINOL: NOT DETECTED

## 2015-07-21 NOTE — ED Notes (Signed)
Pt has ripped the BP cuff off her arm at this time.

## 2015-07-21 NOTE — ED Notes (Addendum)
Other RN in to room to get something and pt was smoking her e-cigarette. Pt was informed that she couldn't smoke that in the room and continued to puff on the e-sig. Security called to talk to the patient.

## 2015-07-21 NOTE — ED Notes (Signed)
Pt leaving room walking without any difficulty at this time.

## 2015-07-23 DIAGNOSIS — R262 Difficulty in walking, not elsewhere classified: Secondary | ICD-10-CM | POA: Diagnosis not present

## 2015-07-23 DIAGNOSIS — E7800 Pure hypercholesterolemia, unspecified: Secondary | ICD-10-CM | POA: Diagnosis not present

## 2015-07-23 DIAGNOSIS — M6281 Muscle weakness (generalized): Secondary | ICD-10-CM | POA: Diagnosis not present

## 2015-07-23 DIAGNOSIS — Z79899 Other long term (current) drug therapy: Secondary | ICD-10-CM | POA: Diagnosis not present

## 2015-07-23 DIAGNOSIS — M545 Low back pain: Secondary | ICD-10-CM | POA: Diagnosis not present

## 2015-07-23 DIAGNOSIS — M5441 Lumbago with sciatica, right side: Secondary | ICD-10-CM | POA: Diagnosis not present

## 2015-07-23 DIAGNOSIS — Z8673 Personal history of transient ischemic attack (TIA), and cerebral infarction without residual deficits: Secondary | ICD-10-CM | POA: Diagnosis not present

## 2015-07-23 DIAGNOSIS — I10 Essential (primary) hypertension: Secondary | ICD-10-CM | POA: Diagnosis not present

## 2015-07-23 DIAGNOSIS — R0602 Shortness of breath: Secondary | ICD-10-CM | POA: Diagnosis not present

## 2015-07-23 DIAGNOSIS — R079 Chest pain, unspecified: Secondary | ICD-10-CM | POA: Diagnosis not present

## 2015-07-23 DIAGNOSIS — R51 Headache: Secondary | ICD-10-CM | POA: Diagnosis not present

## 2015-07-23 DIAGNOSIS — E1140 Type 2 diabetes mellitus with diabetic neuropathy, unspecified: Secondary | ICD-10-CM | POA: Diagnosis not present

## 2015-07-23 DIAGNOSIS — F17200 Nicotine dependence, unspecified, uncomplicated: Secondary | ICD-10-CM | POA: Diagnosis not present

## 2015-07-23 DIAGNOSIS — I252 Old myocardial infarction: Secondary | ICD-10-CM | POA: Diagnosis not present

## 2015-08-06 DIAGNOSIS — K219 Gastro-esophageal reflux disease without esophagitis: Secondary | ICD-10-CM | POA: Diagnosis not present

## 2015-08-06 DIAGNOSIS — G4700 Insomnia, unspecified: Secondary | ICD-10-CM | POA: Diagnosis not present

## 2015-08-06 DIAGNOSIS — J449 Chronic obstructive pulmonary disease, unspecified: Secondary | ICD-10-CM | POA: Diagnosis not present

## 2015-08-06 DIAGNOSIS — E785 Hyperlipidemia, unspecified: Secondary | ICD-10-CM | POA: Diagnosis not present

## 2015-08-06 DIAGNOSIS — Z9181 History of falling: Secondary | ICD-10-CM | POA: Diagnosis not present

## 2015-08-06 DIAGNOSIS — I10 Essential (primary) hypertension: Secondary | ICD-10-CM | POA: Diagnosis not present

## 2015-08-06 DIAGNOSIS — F3181 Bipolar II disorder: Secondary | ICD-10-CM | POA: Diagnosis not present

## 2015-08-06 DIAGNOSIS — F17200 Nicotine dependence, unspecified, uncomplicated: Secondary | ICD-10-CM | POA: Diagnosis not present

## 2015-08-06 DIAGNOSIS — E039 Hypothyroidism, unspecified: Secondary | ICD-10-CM | POA: Diagnosis not present

## 2015-08-06 DIAGNOSIS — E119 Type 2 diabetes mellitus without complications: Secondary | ICD-10-CM | POA: Diagnosis not present

## 2015-08-11 DIAGNOSIS — F3181 Bipolar II disorder: Secondary | ICD-10-CM | POA: Diagnosis not present

## 2015-08-11 DIAGNOSIS — E785 Hyperlipidemia, unspecified: Secondary | ICD-10-CM | POA: Diagnosis not present

## 2015-08-11 DIAGNOSIS — I10 Essential (primary) hypertension: Secondary | ICD-10-CM | POA: Diagnosis not present

## 2015-08-11 DIAGNOSIS — F17200 Nicotine dependence, unspecified, uncomplicated: Secondary | ICD-10-CM | POA: Diagnosis not present

## 2015-08-14 DIAGNOSIS — R0789 Other chest pain: Secondary | ICD-10-CM | POA: Diagnosis not present

## 2015-08-14 DIAGNOSIS — R0600 Dyspnea, unspecified: Secondary | ICD-10-CM | POA: Diagnosis not present

## 2015-08-14 DIAGNOSIS — I10 Essential (primary) hypertension: Secondary | ICD-10-CM | POA: Diagnosis not present

## 2015-08-14 DIAGNOSIS — E119 Type 2 diabetes mellitus without complications: Secondary | ICD-10-CM | POA: Diagnosis not present

## 2015-08-14 DIAGNOSIS — E876 Hypokalemia: Secondary | ICD-10-CM | POA: Diagnosis not present

## 2015-08-14 DIAGNOSIS — K219 Gastro-esophageal reflux disease without esophagitis: Secondary | ICD-10-CM | POA: Diagnosis not present

## 2015-08-14 DIAGNOSIS — G2581 Restless legs syndrome: Secondary | ICD-10-CM | POA: Diagnosis not present

## 2015-08-14 DIAGNOSIS — I160 Hypertensive urgency: Secondary | ICD-10-CM | POA: Diagnosis not present

## 2015-08-15 DIAGNOSIS — E785 Hyperlipidemia, unspecified: Secondary | ICD-10-CM | POA: Diagnosis not present

## 2015-08-15 DIAGNOSIS — I519 Heart disease, unspecified: Secondary | ICD-10-CM | POA: Diagnosis not present

## 2015-08-15 DIAGNOSIS — J449 Chronic obstructive pulmonary disease, unspecified: Secondary | ICD-10-CM | POA: Diagnosis not present

## 2015-08-15 DIAGNOSIS — G2581 Restless legs syndrome: Secondary | ICD-10-CM | POA: Diagnosis not present

## 2015-08-15 DIAGNOSIS — F17210 Nicotine dependence, cigarettes, uncomplicated: Secondary | ICD-10-CM | POA: Diagnosis present

## 2015-08-15 DIAGNOSIS — F17200 Nicotine dependence, unspecified, uncomplicated: Secondary | ICD-10-CM | POA: Diagnosis not present

## 2015-08-15 DIAGNOSIS — I2510 Atherosclerotic heart disease of native coronary artery without angina pectoris: Secondary | ICD-10-CM | POA: Diagnosis not present

## 2015-08-15 DIAGNOSIS — I160 Hypertensive urgency: Secondary | ICD-10-CM | POA: Diagnosis not present

## 2015-08-15 DIAGNOSIS — R0789 Other chest pain: Secondary | ICD-10-CM | POA: Diagnosis not present

## 2015-08-15 DIAGNOSIS — E119 Type 2 diabetes mellitus without complications: Secondary | ICD-10-CM | POA: Diagnosis present

## 2015-08-15 DIAGNOSIS — R079 Chest pain, unspecified: Secondary | ICD-10-CM | POA: Diagnosis not present

## 2015-08-15 DIAGNOSIS — F419 Anxiety disorder, unspecified: Secondary | ICD-10-CM | POA: Diagnosis present

## 2015-08-15 DIAGNOSIS — K219 Gastro-esophageal reflux disease without esophagitis: Secondary | ICD-10-CM | POA: Diagnosis not present

## 2015-08-15 DIAGNOSIS — Z79899 Other long term (current) drug therapy: Secondary | ICD-10-CM | POA: Diagnosis not present

## 2015-08-15 DIAGNOSIS — Z8673 Personal history of transient ischemic attack (TIA), and cerebral infarction without residual deficits: Secondary | ICD-10-CM | POA: Diagnosis not present

## 2015-08-15 DIAGNOSIS — E876 Hypokalemia: Secondary | ICD-10-CM | POA: Diagnosis not present

## 2015-08-15 DIAGNOSIS — Z880 Allergy status to penicillin: Secondary | ICD-10-CM | POA: Diagnosis not present

## 2015-08-15 DIAGNOSIS — I10 Essential (primary) hypertension: Secondary | ICD-10-CM | POA: Diagnosis present

## 2015-08-15 DIAGNOSIS — Z5320 Procedure and treatment not carried out because of patient's decision for unspecified reasons: Secondary | ICD-10-CM | POA: Diagnosis not present

## 2015-08-15 DIAGNOSIS — I679 Cerebrovascular disease, unspecified: Secondary | ICD-10-CM | POA: Diagnosis not present

## 2015-08-18 DIAGNOSIS — M5416 Radiculopathy, lumbar region: Secondary | ICD-10-CM | POA: Diagnosis not present

## 2015-08-18 DIAGNOSIS — G4701 Insomnia due to medical condition: Secondary | ICD-10-CM | POA: Diagnosis not present

## 2015-08-18 DIAGNOSIS — E119 Type 2 diabetes mellitus without complications: Secondary | ICD-10-CM | POA: Diagnosis not present

## 2015-08-18 DIAGNOSIS — I6980 Unspecified sequelae of other cerebrovascular disease: Secondary | ICD-10-CM | POA: Diagnosis not present

## 2015-08-18 DIAGNOSIS — G4459 Other complicated headache syndrome: Secondary | ICD-10-CM | POA: Diagnosis not present

## 2015-08-18 DIAGNOSIS — M25511 Pain in right shoulder: Secondary | ICD-10-CM | POA: Diagnosis not present

## 2015-08-18 DIAGNOSIS — I10 Essential (primary) hypertension: Secondary | ICD-10-CM | POA: Diagnosis not present

## 2015-08-18 DIAGNOSIS — R251 Tremor, unspecified: Secondary | ICD-10-CM | POA: Diagnosis not present

## 2015-08-18 DIAGNOSIS — Z79899 Other long term (current) drug therapy: Secondary | ICD-10-CM | POA: Diagnosis not present

## 2015-08-18 DIAGNOSIS — M545 Low back pain: Secondary | ICD-10-CM | POA: Diagnosis not present

## 2015-08-18 DIAGNOSIS — G2581 Restless legs syndrome: Secondary | ICD-10-CM | POA: Diagnosis not present

## 2015-08-18 IMAGING — CR DG KNEE COMPLETE 4+V*R*
5 series · 5 of 5 positions shown · non-contrast
Comparison: None.

CLINICAL DATA: Severe pain and swelling of the posterior knee after
injury 4 hr ago.

EXAM:
RIGHT KNEE - COMPLETE 4+ VIEW

[x knee ap right (1 of 5)]
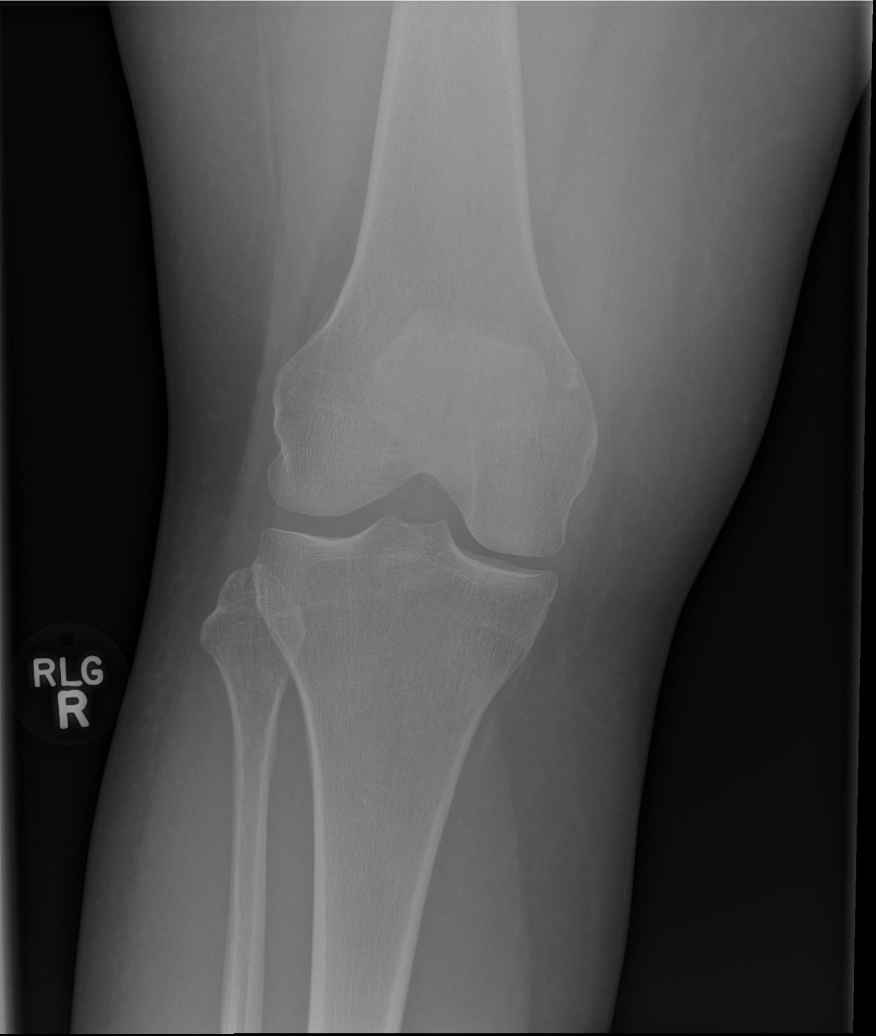

[x knee ap right (2 of 5)]
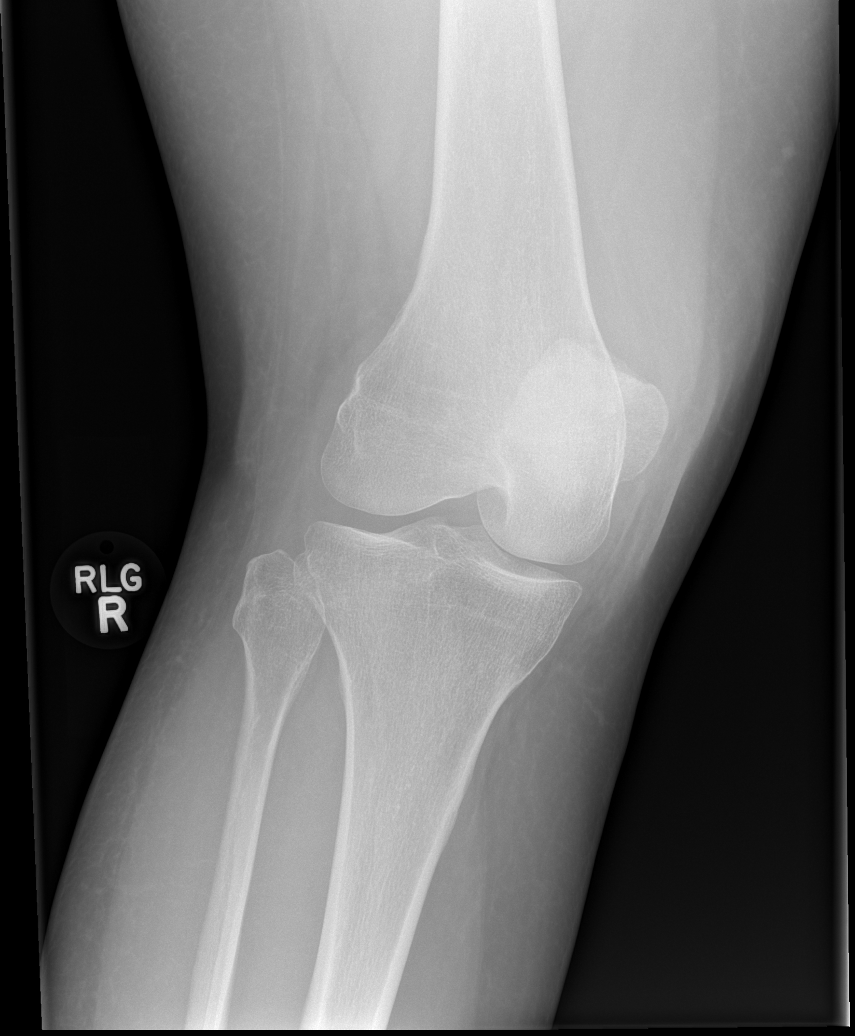

[x knee ap right (3 of 5)]
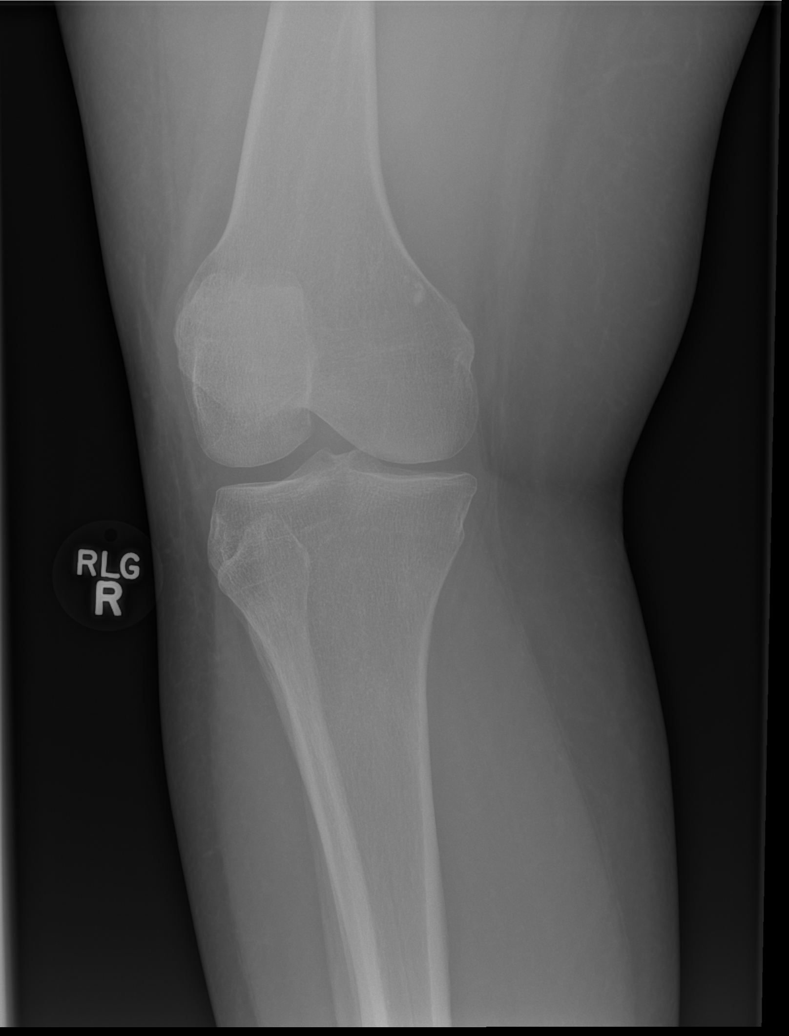

[x knee ap right (4 of 5)]
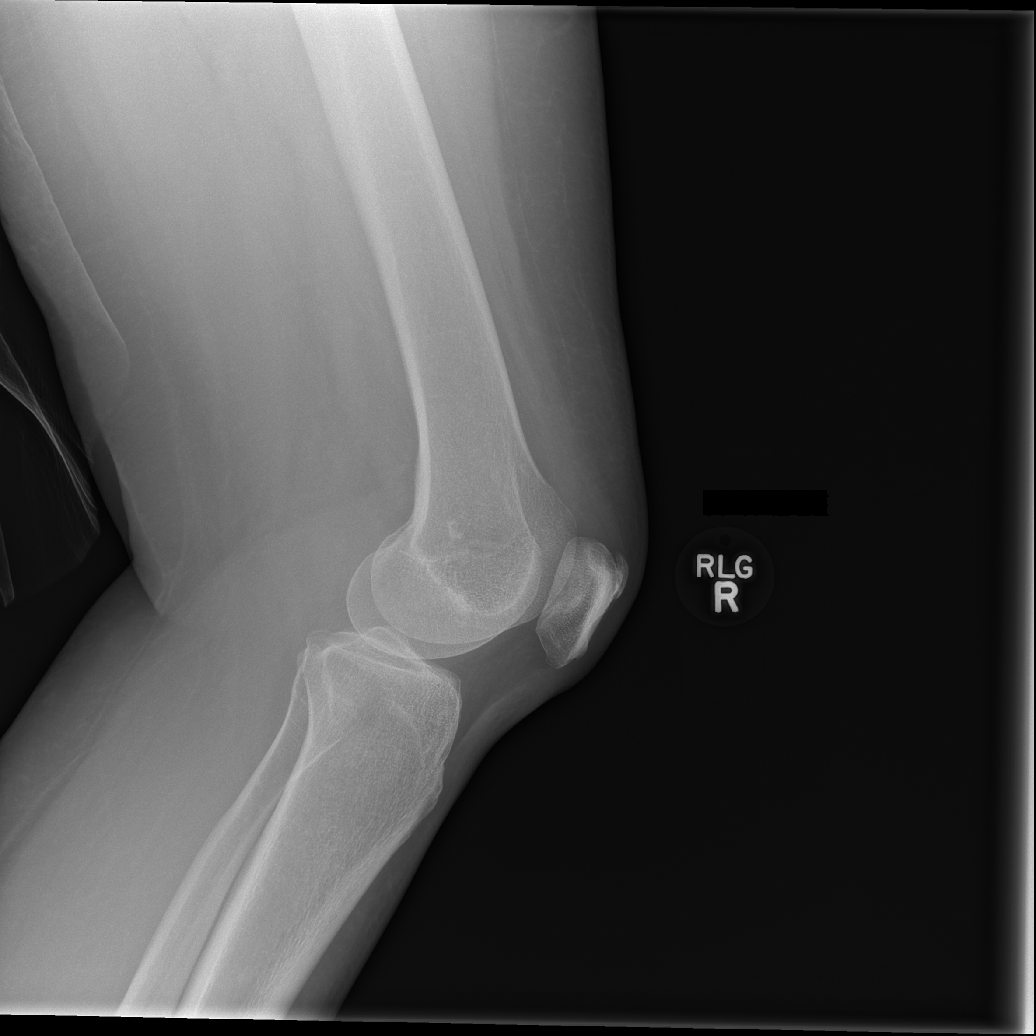

[x knee ap right (5 of 5)]
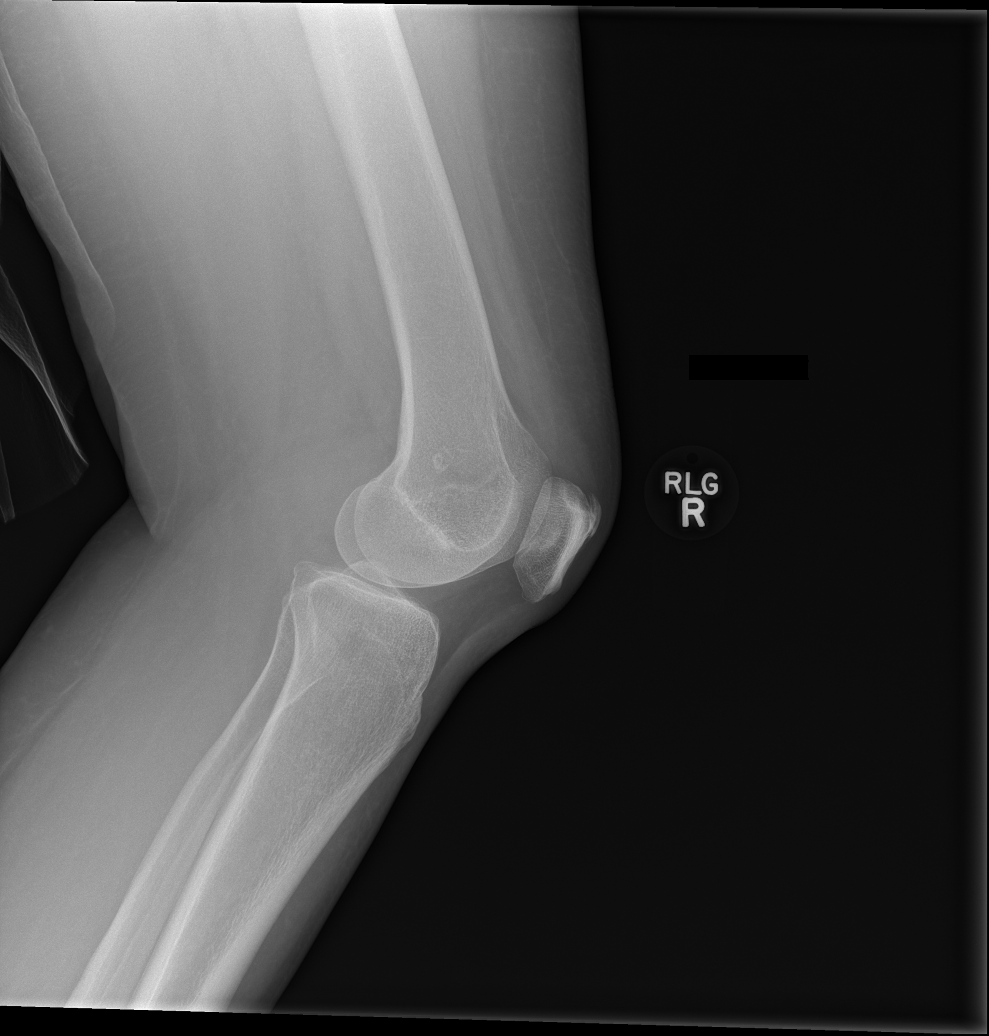

[5 of 5 positions shown; findings below may reference images not displayed]

FINDINGS: There is no evidence of fracture, dislocation, or joint effusion.
There is no evidence of arthropathy or other focal bone abnormality.
Soft tissues are unremarkable.
IMPRESSION: Negative.

## 2015-08-19 DIAGNOSIS — I10 Essential (primary) hypertension: Secondary | ICD-10-CM | POA: Diagnosis not present

## 2015-08-22 DIAGNOSIS — F329 Major depressive disorder, single episode, unspecified: Secondary | ICD-10-CM | POA: Diagnosis not present

## 2015-08-22 DIAGNOSIS — I6930 Unspecified sequelae of cerebral infarction: Secondary | ICD-10-CM | POA: Diagnosis not present

## 2015-08-22 DIAGNOSIS — Z720 Tobacco use: Secondary | ICD-10-CM | POA: Diagnosis not present

## 2015-08-22 DIAGNOSIS — R51 Headache: Secondary | ICD-10-CM | POA: Diagnosis not present

## 2015-08-22 DIAGNOSIS — I10 Essential (primary) hypertension: Secondary | ICD-10-CM | POA: Diagnosis not present

## 2015-08-22 DIAGNOSIS — E119 Type 2 diabetes mellitus without complications: Secondary | ICD-10-CM | POA: Diagnosis not present

## 2015-08-22 DIAGNOSIS — E785 Hyperlipidemia, unspecified: Secondary | ICD-10-CM | POA: Diagnosis not present

## 2015-08-25 DIAGNOSIS — F3181 Bipolar II disorder: Secondary | ICD-10-CM | POA: Diagnosis not present

## 2015-08-25 DIAGNOSIS — E876 Hypokalemia: Secondary | ICD-10-CM | POA: Diagnosis not present

## 2015-08-25 DIAGNOSIS — I10 Essential (primary) hypertension: Secondary | ICD-10-CM | POA: Diagnosis not present

## 2015-08-25 DIAGNOSIS — Z9119 Patient's noncompliance with other medical treatment and regimen: Secondary | ICD-10-CM | POA: Diagnosis not present

## 2015-08-25 DIAGNOSIS — R319 Hematuria, unspecified: Secondary | ICD-10-CM | POA: Diagnosis not present

## 2015-08-27 DIAGNOSIS — H53462 Homonymous bilateral field defects, left side: Secondary | ICD-10-CM | POA: Diagnosis not present

## 2015-08-27 DIAGNOSIS — H35033 Hypertensive retinopathy, bilateral: Secondary | ICD-10-CM | POA: Diagnosis not present

## 2015-08-29 DIAGNOSIS — R112 Nausea with vomiting, unspecified: Secondary | ICD-10-CM | POA: Diagnosis not present

## 2015-08-29 DIAGNOSIS — R319 Hematuria, unspecified: Secondary | ICD-10-CM | POA: Diagnosis not present

## 2015-08-29 DIAGNOSIS — F17200 Nicotine dependence, unspecified, uncomplicated: Secondary | ICD-10-CM | POA: Diagnosis not present

## 2015-08-29 DIAGNOSIS — Z8673 Personal history of transient ischemic attack (TIA), and cerebral infarction without residual deficits: Secondary | ICD-10-CM | POA: Diagnosis not present

## 2015-08-29 DIAGNOSIS — I159 Secondary hypertension, unspecified: Secondary | ICD-10-CM | POA: Diagnosis not present

## 2015-08-29 DIAGNOSIS — Z79899 Other long term (current) drug therapy: Secondary | ICD-10-CM | POA: Diagnosis not present

## 2015-08-29 DIAGNOSIS — I252 Old myocardial infarction: Secondary | ICD-10-CM | POA: Diagnosis not present

## 2015-08-29 DIAGNOSIS — E119 Type 2 diabetes mellitus without complications: Secondary | ICD-10-CM | POA: Diagnosis not present

## 2015-08-29 DIAGNOSIS — R509 Fever, unspecified: Secondary | ICD-10-CM | POA: Diagnosis not present

## 2015-08-29 DIAGNOSIS — I10 Essential (primary) hypertension: Secondary | ICD-10-CM | POA: Diagnosis not present

## 2015-08-29 DIAGNOSIS — R05 Cough: Secondary | ICD-10-CM | POA: Diagnosis not present

## 2015-08-29 DIAGNOSIS — G629 Polyneuropathy, unspecified: Secondary | ICD-10-CM | POA: Diagnosis not present

## 2015-09-01 DIAGNOSIS — J209 Acute bronchitis, unspecified: Secondary | ICD-10-CM | POA: Diagnosis not present

## 2015-09-01 DIAGNOSIS — K146 Glossodynia: Secondary | ICD-10-CM | POA: Diagnosis not present

## 2015-09-01 DIAGNOSIS — R319 Hematuria, unspecified: Secondary | ICD-10-CM | POA: Diagnosis not present

## 2015-09-01 DIAGNOSIS — J40 Bronchitis, not specified as acute or chronic: Secondary | ICD-10-CM | POA: Diagnosis not present

## 2015-09-01 DIAGNOSIS — K1379 Other lesions of oral mucosa: Secondary | ICD-10-CM | POA: Diagnosis not present

## 2015-09-01 DIAGNOSIS — R062 Wheezing: Secondary | ICD-10-CM | POA: Diagnosis not present

## 2015-09-02 IMAGING — CT CT HEAD W/O CM
1 series · 16 of 30 positions shown, 20 images · non-contrast
Comparison: None.

CLINICAL DATA: Headache.  Nausea.

EXAM:
CT HEAD WITHOUT CONTRAST
TECHNIQUE: Contiguous axial images were obtained from the base of the skull
through the vertex without intravenous contrast.

[Series 2: headseq 4.8 h37s · axial · 0.43mm/px · z∈[+1130,+1295]mm · 16 of 36 slices shown, 20 images]
[im 2/36  brain]
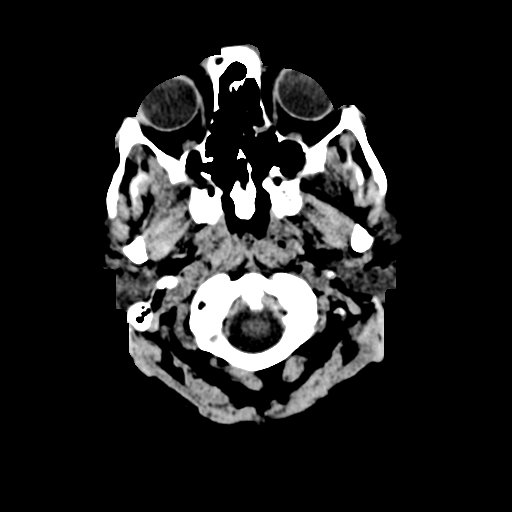
[im 2/36  bone]
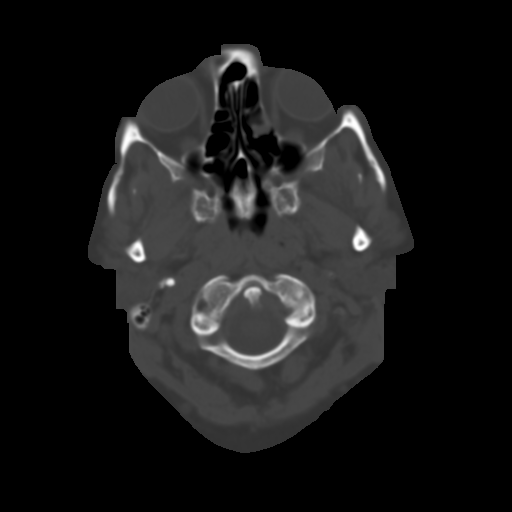
[im 4/36  brain]
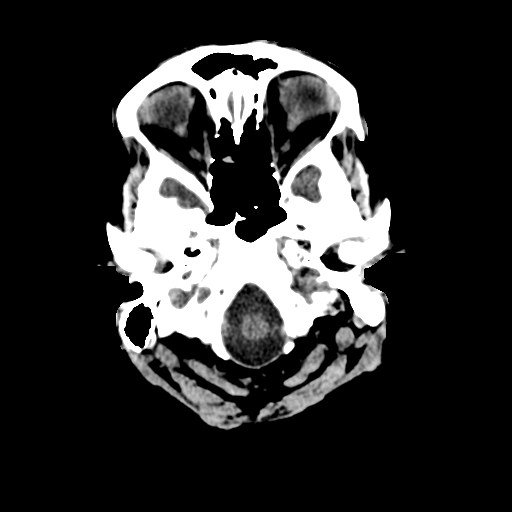
[im 7/36  brain]
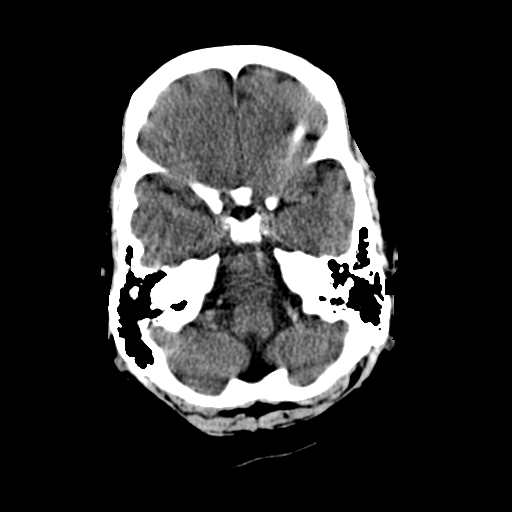
[im 9/36  brain]
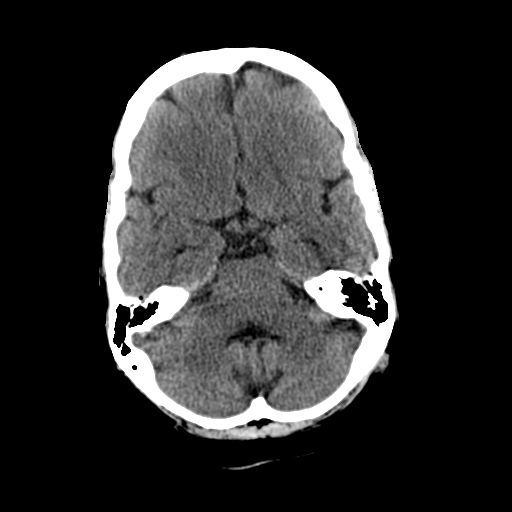
[im 10/36  brain]
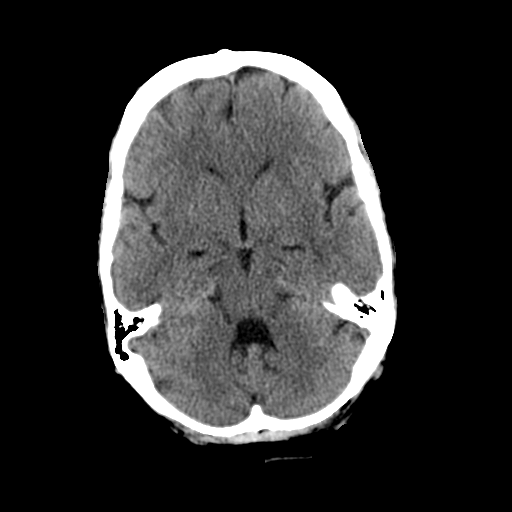
[im 10/36  bone]
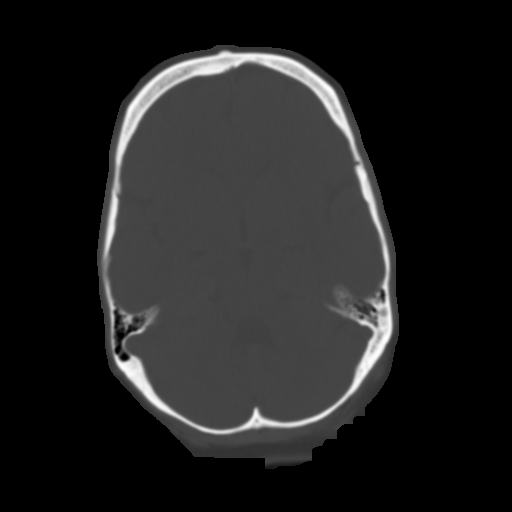
[im 13/36  brain]
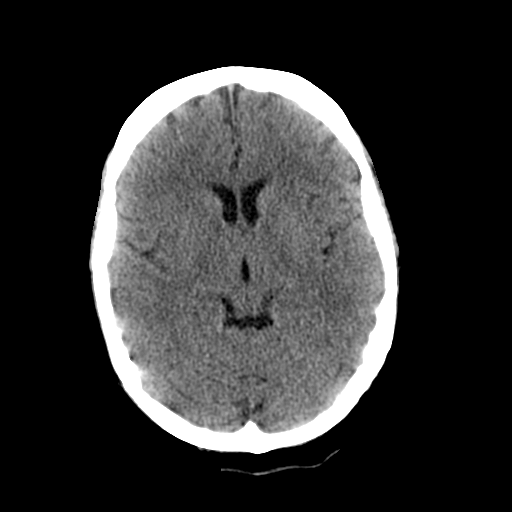
[im 15/36  brain]
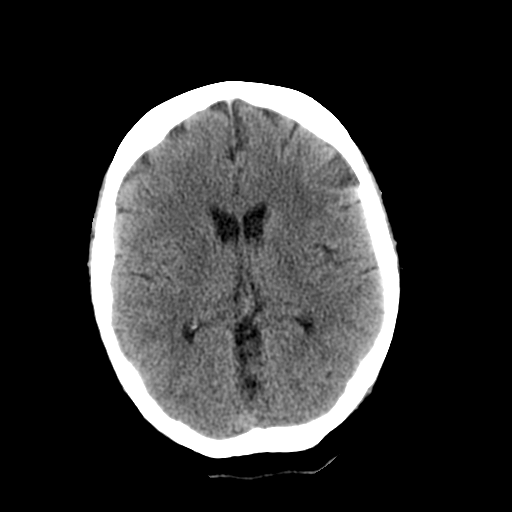
[im 17/36  brain]
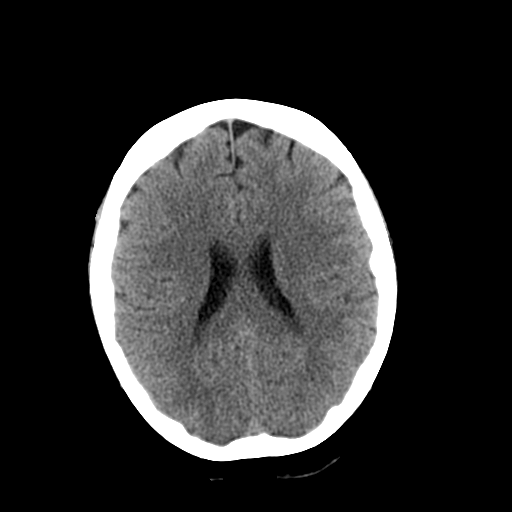
[im 19/36  brain]
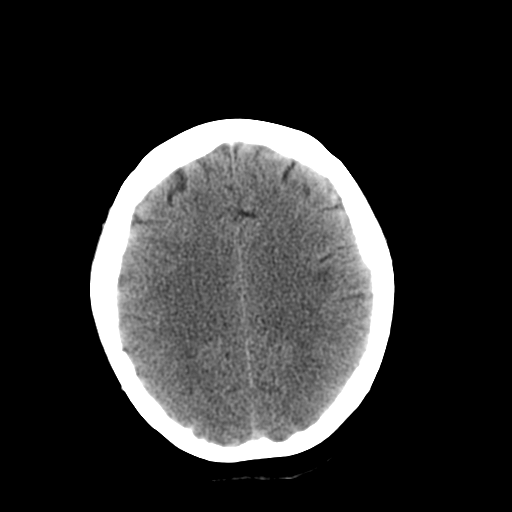
[im 19/36  bone]
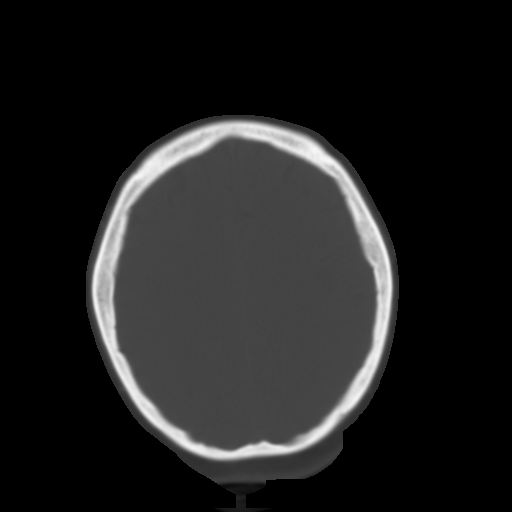
[im 21/36  brain]
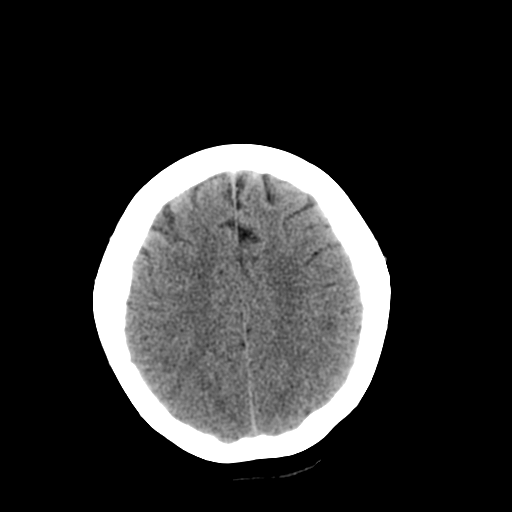
[im 23/36  brain]
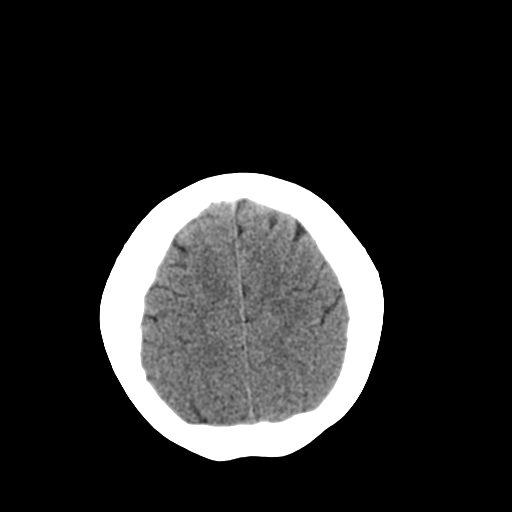
[im 26/36  brain]
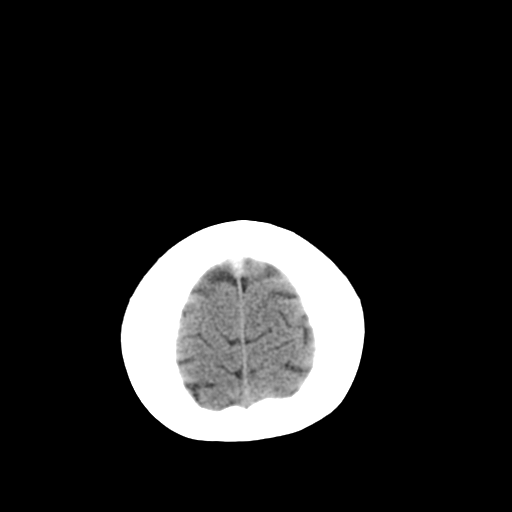
[im 27/36  brain]
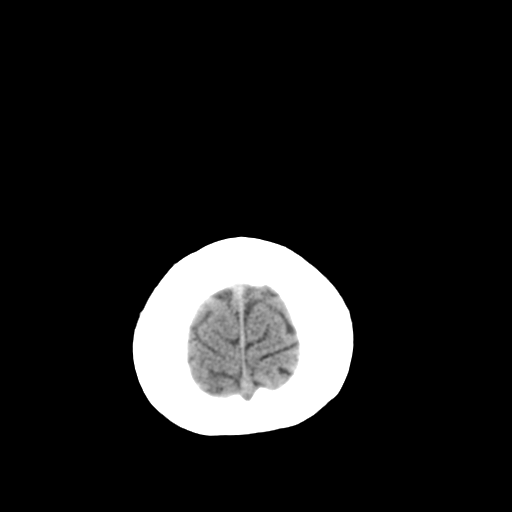
[im 27/36  bone]
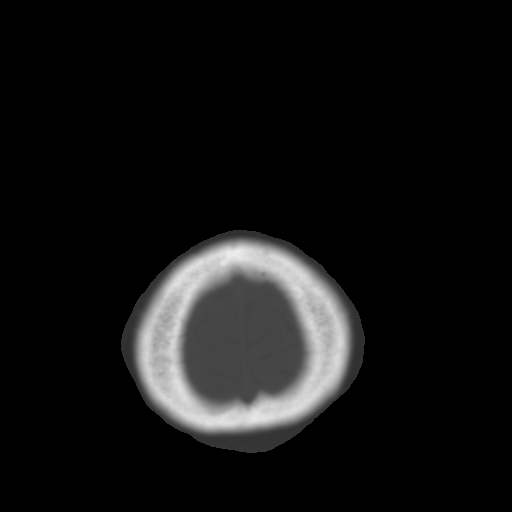
[im 29/36  brain]
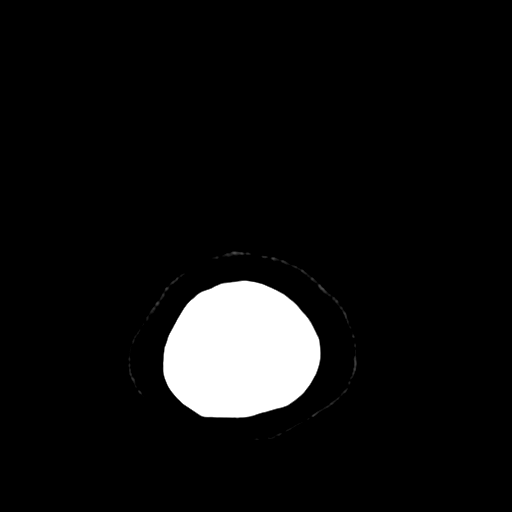
[im 32/36  brain]
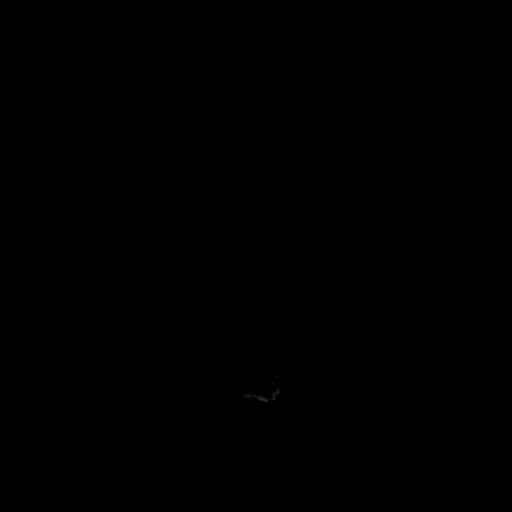
[im 34/36  brain]
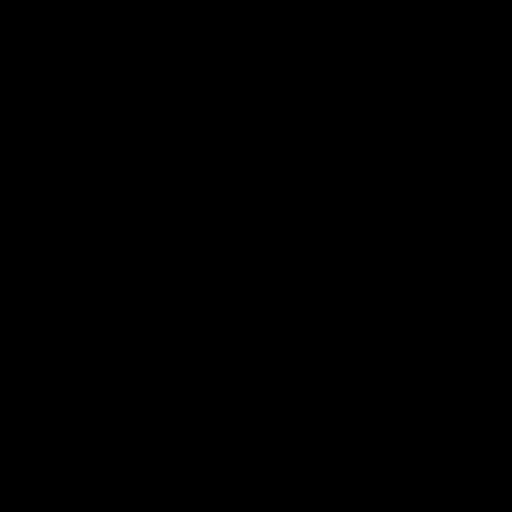

[16 of 30 positions shown; findings below may reference images not displayed]

FINDINGS: There is no evidence for acute hemorrhage, hydrocephalus, mass
lesion, or abnormal extra-axial fluid collection. No definite CT
evidence for acute infarction. The visualized paranasal sinuses and
mastoid air cells are clear.
IMPRESSION: No acute intracranial abnormality.

## 2015-09-07 ENCOUNTER — Emergency Department (HOSPITAL_COMMUNITY)
Admission: EM | Admit: 2015-09-07 | Discharge: 2015-09-08 | Disposition: A | Discharge status: Home / Self Care | Payer: Medicare Other | Attending: Emergency Medicine | Admitting: Emergency Medicine

## 2015-09-07 DIAGNOSIS — J449 Chronic obstructive pulmonary disease, unspecified: Secondary | ICD-10-CM | POA: Insufficient documentation

## 2015-09-07 DIAGNOSIS — I10 Essential (primary) hypertension: Secondary | ICD-10-CM | POA: Insufficient documentation

## 2015-09-07 DIAGNOSIS — Z79899 Other long term (current) drug therapy: Secondary | ICD-10-CM | POA: Diagnosis not present

## 2015-09-07 DIAGNOSIS — E039 Hypothyroidism, unspecified: Secondary | ICD-10-CM | POA: Insufficient documentation

## 2015-09-07 DIAGNOSIS — I639 Cerebral infarction, unspecified: Secondary | ICD-10-CM | POA: Diagnosis not present

## 2015-09-07 DIAGNOSIS — E119 Type 2 diabetes mellitus without complications: Secondary | ICD-10-CM | POA: Diagnosis not present

## 2015-09-07 DIAGNOSIS — I2510 Atherosclerotic heart disease of native coronary artery without angina pectoris: Secondary | ICD-10-CM | POA: Diagnosis not present

## 2015-09-07 DIAGNOSIS — R110 Nausea: Secondary | ICD-10-CM | POA: Diagnosis not present

## 2015-09-07 DIAGNOSIS — M545 Low back pain, unspecified: Secondary | ICD-10-CM

## 2015-09-07 DIAGNOSIS — R109 Unspecified abdominal pain: Secondary | ICD-10-CM | POA: Diagnosis present

## 2015-09-07 DIAGNOSIS — R319 Hematuria, unspecified: Secondary | ICD-10-CM | POA: Diagnosis not present

## 2015-09-07 DIAGNOSIS — F17210 Nicotine dependence, cigarettes, uncomplicated: Secondary | ICD-10-CM | POA: Diagnosis not present

## 2015-09-07 LAB — COMPREHENSIVE METABOLIC PANEL
ALBUMIN: 3.7 g/dL (ref 3.5–5.0)
ALT: 40 U/L (ref 14–54)
AST: 23 U/L (ref 15–41)
Alkaline Phosphatase: 72 U/L (ref 38–126)
Anion gap: 8 (ref 5–15)
BUN: 20 mg/dL (ref 6–20)
CHLORIDE: 109 mmol/L (ref 101–111)
CO2: 24 mmol/L (ref 22–32)
Calcium: 8.6 mg/dL — ABNORMAL LOW (ref 8.9–10.3)
Creatinine, Ser: 0.78 mg/dL (ref 0.44–1.00)
GFR calc Af Amer: >60 mL/min (ref >60)
GFR calc non Af Amer: >60 mL/min (ref >60)
GLUCOSE: 87 mg/dL (ref 65–99)
POTASSIUM: 3.7 mmol/L (ref 3.5–5.1)
Sodium: 141 mmol/L (ref 135–145)
Total Bilirubin: 0.1 mg/dL — ABNORMAL LOW (ref 0.3–1.2)
Total Protein: 7.2 g/dL (ref 6.5–8.1)

## 2015-09-07 LAB — CBC WITH DIFFERENTIAL/PLATELET
BASOS ABS: 0.1 10*3/uL (ref 0.0–0.1)
BASOS PCT: 1 %
EOS PCT: 3 %
Eosinophils Absolute: 0.3 10*3/uL (ref 0.0–0.7)
HEMATOCRIT: 39.3 % (ref 36.0–46.0)
Hemoglobin: 12.9 g/dL (ref 12.0–15.0)
Lymphocytes Relative: 46 %
Lymphs Abs: 4.1 10*3/uL — ABNORMAL HIGH (ref 0.7–4.0)
MCH: 28.5 pg (ref 26.0–34.0)
MCHC: 32.8 g/dL (ref 30.0–36.0)
MCV: 86.9 fL (ref 78.0–100.0)
MONO ABS: 0.8 10*3/uL (ref 0.1–1.0)
Monocytes Relative: 9 %
NEUTROS ABS: 3.7 10*3/uL (ref 1.7–7.7)
Neutrophils Relative %: 41 %
PLATELETS: 357 10*3/uL (ref 150–400)
RBC: 4.52 MIL/uL (ref 3.87–5.11)
RDW: 14.4 % (ref 11.5–15.5)
WBC: 9.1 10*3/uL (ref 4.0–10.5)

## 2015-09-07 NOTE — ED Provider Notes (Signed)
CSN: [id number]     Arrival date & time 09/07/15  2109 History  By signing my name below, I, Altamease Oiler, attest that this documentation has been prepared under the direction and in the presence of Daleen Bo, MD. Electronically Signed: Altamease Oiler, ED Scribe. 09/07/2015. 11:39 PM   Chief Complaint  Patient presents with  . Flank Pain   The history is provided by the patient. No language interpreter was used.   Katrina [name] is a 55 y.o. female who presents to the Emergency Department complaining of worsening, 10/10 in severity, cramping, bilateral lower back and flank pain with gradual onset yesterday. The pain radiates to the lower abdomen. She reports that a couple weeks ago she saw a Nurse Practitioner at "[facility]" and was diagnosed with bronchitis but at that time blood was also noted to be in her urine. She was treated with Zithromax, for bronchitis.  Associated symptoms today include nausea, difficulty urinating, and hematuria today. She has had diarrhea over the last week but it improved with Imodium.  Pt denies dysuria, difficulty eating or drinking, and fever. She notes that she had an allergic reaction to Zithromax last week with blisters in her mouth. There are no other known modifying factors.  Past Medical History  Diagnosis Date  . Type 2 diabetes mellitus ([place])   . Essential hypertension   . Hyperlipidemia   . COPD (chronic obstructive pulmonary disease) ([place])   . GERD (gastroesophageal reflux disease)   . CAD (coronary artery disease)     Moderate nonobstructive 2012-2013, [place]  . Hypothyroid   . Lung nodules   . Diverticulosis   . Bipolar affective ([place])   . Left-sided weakness 03/20/2014    MRI brain negative for stroke.  . 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 Back surgery    . Abdominal hysterectomy    . Appendectomy    . Tonsillectomy    . Ankle surgery    . Cholecystectomy    . Nose surgery     . Cardiac catheterization N/A 01/31/2015    Procedure: Left Heart Cath and Coronary Angiography;  Surgeon: Burnell Blanks, MD;  Location: [facility];  Service: Cardiovascular;  Laterality: N/A;   Family History  Problem Relation Age of Onset  . Coronary artery disease Father 56  . Emphysema Father    Social History  Substance Use Topics  . Smoking status: Current Every Day Smoker -- 2.00 packs/day for 40 years    Types: Cigarettes    Start date: 03/04/1971  . Smokeless tobacco: Never Used  . Alcohol Use: No   OB History    No data available     Review of Systems  Gastrointestinal: Positive for nausea and abdominal pain. Negative for vomiting.  Genitourinary: Positive for hematuria, flank pain and difficulty urinating. Negative for dysuria.  Musculoskeletal: Positive for back pain.  All other systems reviewed and are negative.  Allergies  Penicillins; Pineapple; Strawberry extract; Aspirin; Aspartame and phenylalanine; and Poultry meal  Home Medications   Prior to Admission medications   Medication Sig Start Date End Date Taking? Authorizing Provider  amLODipine (NORVASC) 10 MG tablet Take 1 tablet (10 mg total) by mouth daily. 06/05/14   Herminio Commons, MD  carisoprodol (SOMA) 350 MG tablet Take 350 mg by mouth 4 (four) times daily as needed for muscle spasms.  05/27/14   Historical Provider, MD  dexlansoprazole (DEXILANT) 60 MG capsule Take  1 capsule (60 mg total) by mouth every evening. Patient not taking: Reported on 07/20/2015 03/20/14   Rexene Alberts, MD  EPINEPHrine (EPI-PEN) 0.3 mg/0.3 mL DEVI Inject 0.3 mg into the muscle once as needed (allergic reaction).     Historical Provider, MD  gabapentin (NEURONTIN) 600 MG tablet Take 600 mg by mouth 4 (four) times daily. 07/05/15   Historical Provider, MD  oxyCODONE-acetaminophen (PERCOCET/ROXICET) 5-325 MG tablet Take 1 tablet by mouth 3 (three) times daily as needed. 07/07/15   Historical Provider, MD   rOPINIRole (REQUIP) 3 MG tablet Take 1 tablet (3 mg total) by mouth at bedtime. 03/20/14   Rexene Alberts, MD  spironolactone (ALDACTONE) 25 MG tablet Take 1 tablet (25 mg total) by mouth daily. Patient not taking: Reported on 07/20/2015 01/30/15   Herminio Commons, MD  zolpidem (AMBIEN) 10 MG tablet Take 1 tablet (10 mg total) by mouth at bedtime. 03/20/14   Rexene Alberts, MD   BP 176/93 mmHg  Pulse 81  Temp(Src) 99.2 F (37.3 C) (Oral)  Resp 20  Ht 5\' 6"  (1.676 m)  Wt 210 lb (95.255 kg)  BMI 33.91 kg/m2  SpO2 95% Physical Exam  Constitutional: She is oriented to person, place, and time. She appears well-developed and well-nourished.  HENT:  Head: Normocephalic and atraumatic.  Mouth/Throat: Posterior oropharyngeal erythema (mild ) present.  Eyes: Conjunctivae and EOM are normal. Pupils are equal, round, and reactive to light.  Neck: Normal range of motion and phonation normal. Neck supple.  Cardiovascular: Normal rate and regular rhythm.   Pulmonary/Chest: Effort normal and breath sounds normal. She exhibits no tenderness.  Abdominal: Soft. She exhibits no distension. There is tenderness in the suprapubic area. There is no guarding.  Musculoskeletal: Normal range of motion.  Diffusely tender in bilateral flanks and lumbar region   Neurological: She is alert and oriented to person, place, and time. She exhibits normal muscle tone.  Skin: Skin is warm and dry.  Psychiatric: She has a normal mood and affect. Her behavior is normal. Judgment and thought content normal.  Nursing note and vitals reviewed.   ED Course  Procedures (including critical care time)  Initial clinical impression- nontoxic, patient with lower back pain radiating to the abdomen, bilaterally. She feels like she is urinating less than usual, and is concerned about bleeding in her urine. Her symptoms are bilateral, therefore not indicative of a obstructing renal stone. She will be evaluated for complications from  the hematuria, including anemia, and evaluated for UTI.  Medications - No data to display  Patient Vitals for the past 24 hrs:  BP Temp Temp src Pulse Resp SpO2 Height Weight  09/08/15 0031 182/76 mmHg 98.3 F (36.8 C) Oral 85 22 100 % - -  09/07/15 2151 176/93 mmHg 99.2 F (37.3 C) Oral 81 20 95 % 5\' 6"  (1.676 m) 210 lb (95.255 kg)   Data from [place] Controlled Substance Reporting System: She is receiving narcotic analgesia, and chronic pain dosage amounts. Last received oxycodone with APAP, 7.5/325 mg #90 on 08/18/2015. In January 2017. She received large prescriptions for both hydrocodone, and tramadol, within 2 days of each other. This indicates narcotic diversion behavior.   2:11 AM Reevaluation with update and discussion. After initial assessment and treatment, an updated evaluation reveals she is sitting up comfortably. No additional complaints. Findings discussed with patient and all questions were answered. Theophilus Walz L   02:25- . Shortly after I evaluated the patient she got dressed, and left the department without  receiving her discharge instructions.  DIAGNOSTIC STUDIES: Oxygen Saturation is 95% on RA,  normal by my interpretation.    COORDINATION OF CARE: 11:34 PM Discussed treatment plan which includes lab work with pt at bedside and pt agreed to plan.  Labs Review Labs Reviewed  URINALYSIS, ROUTINE W REFLEX MICROSCOPIC (NOT AT [facility]) - Abnormal; Notable for the following:    Hgb urine dipstick SMALL (*)    All other components within normal limits  CBC WITH DIFFERENTIAL/PLATELET - Abnormal; Notable for the following:    Lymphs Abs 4.1 (*)    All other components within normal limits  COMPREHENSIVE METABOLIC PANEL - Abnormal; Notable for the following:    Calcium 8.6 (*)    Total Bilirubin 0.1 (*)    All other components within normal limits  URINE MICROSCOPIC-ADD ON - Abnormal; Notable for the following:    Squamous Epithelial / LPF 0-5 (*)    Bacteria, UA RARE (*)     All other components within normal limits  URINE CULTURE    Imaging Review No results found. I have personally reviewed and evaluated these lab results as part of my medical decision-making.   EKG Interpretation None      MDM   Final diagnoses:  Hematuria  Midline low back pain without sciatica    Nonspecific low back pain associated with chronic condition. Reported hematuria without evidence for urolithiasis, or urinary tract infection. Doubt serous back to infection, metabolic instability or impending vascular collapse.  Nursing Notes Reviewed/ Care Coordinated Applicable Imaging Reviewed Interpretation of Laboratory Data incorporated into ED treatment  The patient appears reasonably screened and/or stabilized for discharge and I doubt any other medical condition or other [facility] requiring further screening, evaluation, or treatment in the ED at this time prior to discharge.  Plan: Home Medications- usual; Home Treatments- rest, heat; return here if the recommended treatment, does not improve the symptoms; Recommended follow up- PCP prn. Urology 1 week to be evaluated for Hematuria   I personally performed the services described in this documentation, which was scribed in my presence. The recorded information has been reviewed and is accurate.   >>> The patient did not receive discharge instructions, left before they were brought to her room<<<   Daleen Bo, MD 09/08/15 [id number]

## 2015-09-07 NOTE — ED Notes (Addendum)
Pt c/o bilateral flank pain and cramping that radiates around to her back. Pt reports decreased urination and blood in her urine. Pt has seen her PCP and twice they've told her she has blood in her urine.

## 2015-09-08 DIAGNOSIS — R319 Hematuria, unspecified: Secondary | ICD-10-CM | POA: Diagnosis not present

## 2015-09-08 LAB — URINALYSIS, ROUTINE W REFLEX MICROSCOPIC
Bilirubin Urine: NEGATIVE
GLUCOSE, UA: NEGATIVE mg/dL
KETONES UR: NEGATIVE mg/dL
LEUKOCYTES UA: NEGATIVE
Nitrite: NEGATIVE
PROTEIN: NEGATIVE mg/dL
Specific Gravity, Urine: 1.015 (ref 1.005–1.030)
pH: 6.5 (ref 5.0–8.0)

## 2015-09-08 LAB — URINE MICROSCOPIC-ADD ON

## 2015-09-08 NOTE — Discharge Instructions (Signed)
There was a very small amount of bleeding in the urine today. We have ordered a urine culture to make sure that you do not have a urinary tract infection. For the pain in your lower back continue to use your usual pain medicine. It may also help to use a heating pad on the lower back. The CT scan done in January 2017 indicated degenerative changes of the bones with arthritis and bulging disks in the lower back. Both of these, cause painful conditions.    Back Pain, Adult Back pain is very common in adults.The cause of back pain is rarely dangerous and the pain often gets better over time.The cause of your back pain may not be known. Some common causes of back pain include:  Strain of the muscles or ligaments supporting the spine.  Wear and tear (degeneration) of the spinal disks.  Arthritis.  Direct injury to the back. For many people, back pain may return. Since back pain is rarely dangerous, most people can learn to manage this condition on their own. HOME CARE INSTRUCTIONS Watch your back pain for any changes. The following actions may help to lessen any discomfort you are feeling:  Remain active. It is stressful on your back to sit or stand in one place for long periods of time. Do not sit, drive, or stand in one place for more than 30 minutes at a time. Take short walks on even surfaces as soon as you are able.Try to increase the length of time you walk each day.  Exercise regularly as directed by your health care provider. Exercise helps your back heal faster. It also helps avoid future injury by keeping your muscles strong and flexible.  Do not stay in bed.Resting more than 1-2 days can delay your recovery.  Pay attention to your body when you bend and lift. The most comfortable positions are those that put less stress on your recovering back. Always use proper lifting techniques, including:  Bending your knees.  Keeping the load close to your body.  Avoiding  twisting.  Find a comfortable position to sleep. Use a firm mattress and lie on your side with your knees slightly bent. If you lie on your back, put a pillow under your knees.  Avoid feeling anxious or stressed.Stress increases muscle tension and can worsen back pain.It is important to recognize when you are anxious or stressed and learn ways to manage it, such as with exercise.  Take medicines only as directed by your health care provider. Over-the-counter medicines to reduce pain and inflammation are often the most helpful.Your health care provider may prescribe muscle relaxant drugs.These medicines help dull your pain so you can more quickly return to your normal activities and healthy exercise.  Apply ice to the injured area:  Put ice in a plastic bag.  Place a towel between your skin and the bag.  Leave the ice on for 20 minutes, 2-3 times a day for the first 2-3 days. After that, ice and heat may be alternated to reduce pain and spasms.  Maintain a healthy weight. Excess weight puts extra stress on your back and makes it difficult to maintain good posture. SEEK MEDICAL CARE IF:  You have pain that is not relieved with rest or medicine.  You have increasing pain going down into the legs or buttocks.  You have pain that does not improve in one week.  You have night pain.  You lose weight.  You have a fever or chills. SEEK IMMEDIATE  MEDICAL CARE IF:   You develop new bowel or bladder control problems.  You have unusual weakness or numbness in your arms or legs.  You develop nausea or vomiting.  You develop abdominal pain.  You feel faint.   This information is not intended to replace advice given to you by your health care provider. Make sure you discuss any questions you have with your health care provider.   Document Released: 05/24/2005 Document Revised: 06/14/2014 Document Reviewed: 09/25/2013 Elsevier Interactive Patient Education 2016 Elsevier  Inc.  Hematuria, Adult Hematuria is blood in your urine. It can be caused by a bladder infection, kidney infection, prostate infection, kidney stone, or cancer of your urinary tract. Infections can usually be treated with medicine, and a kidney stone usually will pass through your urine. If neither of these is the cause of your hematuria, further workup to find out the reason may be needed. It is very important that you tell your health care provider about any blood you see in your urine, even if the blood stops without treatment or happens without causing pain. Blood in your urine that happens and then stops and then happens again can be a symptom of a very serious condition. Also, pain is not a symptom in the initial stages of many urinary cancers. HOME CARE INSTRUCTIONS   Drink lots of fluid, 3-4 quarts a day. If you have been diagnosed with an infection, cranberry juice is especially recommended, in addition to large amounts of water.  Avoid caffeine, tea, and carbonated beverages because they tend to irritate the bladder.  Avoid alcohol because it may irritate the prostate.  Take all medicines as directed by your health care provider.  If you were prescribed an antibiotic medicine, finish it all even if you start to feel better.  If you have been diagnosed with a kidney stone, follow your health care provider's instructions regarding straining your urine to catch the stone.  Empty your bladder often. Avoid holding urine for long periods of time.  After a bowel movement, women should cleanse front to back. Use each tissue only once.  Empty your bladder before and after sexual intercourse if you are a female. SEEK MEDICAL CARE IF:  You develop back pain.  You have a fever.  You have a feeling of sickness in your stomach (nausea) or vomiting.  Your symptoms are not better in 3 days. Return sooner if you are getting worse. SEEK IMMEDIATE MEDICAL CARE IF:   You develop severe  vomiting and are unable to keep the medicine down.  You develop severe back or abdominal pain despite taking your medicines.  You begin passing a large amount of blood or clots in your urine.  You feel extremely weak or faint, or you pass out. MAKE SURE YOU:   Understand these instructions.  Will watch your condition.  Will get help right away if you are not doing well or get worse.   This information is not intended to replace advice given to you by your health care provider. Make sure you discuss any questions you have with your health care provider.   Document Released: 05/24/2005 Document Revised: 06/14/2014 Document Reviewed: 01/22/2013 Elsevier Interactive Patient Education [employer].

## 2015-09-08 NOTE — ED Notes (Signed)
EDP went in to see this pt to go over her labs and urine. Pt apparently unhappy because she walked out of room refusing discharge papers or to sign her discharge.

## 2015-09-09 LAB — URINE CULTURE

## 2015-09-10 ENCOUNTER — Ambulatory Visit (HOSPITAL_COMMUNITY): Payer: Medicare Other | Attending: Neurology | Admitting: Physical Therapy

## 2015-09-10 DIAGNOSIS — M6281 Muscle weakness (generalized): Secondary | ICD-10-CM | POA: Diagnosis present

## 2015-09-10 DIAGNOSIS — Z9181 History of falling: Secondary | ICD-10-CM | POA: Diagnosis present

## 2015-09-10 DIAGNOSIS — R2689 Other abnormalities of gait and mobility: Secondary | ICD-10-CM | POA: Insufficient documentation

## 2015-09-10 DIAGNOSIS — R6889 Other general symptoms and signs: Secondary | ICD-10-CM | POA: Insufficient documentation

## 2015-09-10 DIAGNOSIS — M5442 Lumbago with sciatica, left side: Secondary | ICD-10-CM | POA: Diagnosis present

## 2015-09-10 NOTE — Therapy (Signed)
[place], [place], [address] Phone: [phone]   Fax:  [phone]  Physical Therapy Evaluation  Patient Details  Name: Katrina [name] MRN: [id number] Date of Birth: [birth year] Referring Provider: Phillips Odor  Encounter Date: 09/10/2015      PT End of Session - 09/10/15 1145    Visit Number 1   Number of Visits 8   Date for PT Re-Evaluation 10/25/15  Pt going to [place] for a week    Authorization Type medicare   PT Start Time 1020   PT Stop Time 1100   PT Time Calculation (min) 40 min   Activity Tolerance Patient limited by pain   Behavior During Therapy [facility] for tasks assessed/performed      Past Medical History  Diagnosis Date  . Type 2 diabetes mellitus ([place])   . Essential hypertension   . Hyperlipidemia   . COPD (chronic obstructive pulmonary disease) ([place])   . GERD (gastroesophageal reflux disease)   . CAD (coronary artery disease)     Moderate nonobstructive 2012-2013, [place]  . Hypothyroid   . Lung nodules   . Diverticulosis   . Bipolar affective ([place])   . Left-sided weakness 03/20/2014    MRI brain negative for stroke.  . 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 Back surgery    . Abdominal hysterectomy    . Appendectomy    . Tonsillectomy    . Ankle surgery    . Cholecystectomy    . Nose surgery    . Cardiac catheterization N/A 01/31/2015    Procedure: Left Heart Cath and Coronary Angiography;  Surgeon: Burnell Blanks, MD;  Location: [place] [facility];  Service: Cardiovascular;  Laterality: N/A;    There were no vitals filed for this visit.  Visit Diagnosis:  Muscle weakness (generalized) - Plan: PT plan of care cert/re-cert  Other abnormalities of gait and mobility - Plan: PT plan of care cert/re-cert  Bilateral low back pain with left-sided sciatica - Plan: PT plan of care cert/re-cert  Decreased activity  tolerance - Plan: PT plan of care cert/re-cert  Risk for falls - Plan: PT plan of care cert/re-cert      Subjective Assessment - 09/10/15 1034    Subjective Katrina [name] states that she does not want to be in therapy.  She had back surgery in 2005; her back pain was tolerable until she fell down steps in January.  At that time her pain exculated and has been significant ever since.  She states that the back surgeon stated that she needed to have four weeks of therapy prior as well as six month post CVA.  The patient states that she has walkers and canes but does not want and will not use them.   Her goal is to have the surgery.     Pertinent History CVA 2015 as well as October 2016; ; back surgery 2005   How long can you sit comfortably? immediate discomfort    How long can you stand comfortably? Pt states she does not stand; shower are five minutes or less    How long can you walk comfortably? Unable to walk without pain the longest she walks is in her home room to room    Diagnostic tests CT:  bulge disc L3-4; L4-5 ; fusion at L5-S1    Currently in Pain? Yes   Pain Score 10-Worst pain ever  Pain Location Back   Pain Descriptors / Indicators Aching;Constant   Pain Type Chronic pain   Pain Radiating Towards Lt greater than right pain goes down to her toes    Pain Onset More than a month ago   Pain Frequency Constant   Aggravating Factors  activity   Pain Relieving Factors nothing             [facility] PT Assessment - 09/10/15 0001    Assessment   Medical Diagnosis abnormal gait    Referring Provider Kofi [place]   Onset Date/Surgical Date 06/15/15   Hand Dominance Right   Next MD Visit --  10/03/2015   Prior Therapy home health    Precautions   Precautions Fall   Restrictions   Weight Bearing Restrictions No   Balance Screen   Has the patient fallen in the past 6 months Yes   How many times? 1   Has the patient had a decrease in activity level because of a fear of falling?   Yes   Is the patient reluctant to leave their home because of a fear of falling?  No   Home Environment   Living Environment Private residence   Type of [place] to enter   Entrance Stairs-Number of Steps [place] --  lives in the [place] - 2 wheels;Cane - single point   Prior Function   Level of Independence Independent with basic ADLs   Vocation On disability   Leisure crochet; sew    Cognition   Overall Cognitive Status Within Functional Limits for tasks assessed   Functional Tests   Functional tests Single leg stance;Sit to Stand   Single Leg Stance   Comments RT:  1 second ; Lt 0   Sit to Stand   Comments using one hand one sit to stand took 11:35    ROM / Strength   AROM / PROM / Strength Strength   Strength   Strength Assessment Site Hip;Knee;Ankle   Right/Left Hip Right;Left   Right Hip Flexion 2+/5   Right Hip Extension 2-/5   Right Hip ABduction 2/5   Left Hip Flexion 2-/5   Left Hip Extension 2-/5   Left Hip ABduction 2-/5   Right/Left Knee Right;Left   Right Knee Extension 3+/5   Left Knee Extension 3-/5   Right/Left Ankle Right;Left   Right Ankle Dorsiflexion 3+/5   Left Ankle Dorsiflexion 3/5  cogwheel    Ambulation/Gait   Ambulation Distance (Feet) 35 Feet   Assistive device None   Gait Pattern Decreased arm swing - left;Decreased step length - right;Left circumduction;Antalgic   Ambulation Surface Level   Gait Comments slow to advance Lt LE                    [facility] Adult PT Treatment/Exercise - 09/10/15 0001    Exercises   Exercises Lumbar   Lumbar Exercises: Stretches   Single Knee to Chest Stretch --  refused    Lumbar Exercises: Supine   Ab Set 5 reps   Glut Set 5 reps   Other Supine Lumbar Exercises hip abduction with t-band x 10                 PT Education - 09/10/15 1145    Education provided Yes   Education Details HEP   Person(s) Educated Patient    Methods Explanation;Tactile cues;Verbal cues;Handout   Comprehension  Verbalized understanding;Returned demonstration          PT Short Term Goals - 09/10/15 1208    PT SHORT TERM GOAL #1   Title Pt to incresse core and  left LE strength by one grade to allow pt to come sit to stand from a chair 8 seconds or less    Time 2   Period Weeks   Status New   PT SHORT TERM GOAL #2   Title Pt back pain to be no greater than a 7/10 to allow pt to be able to walk to the mailbox and back without having increased pain.    Time 2   Period Weeks   Status New   PT SHORT TERM GOAL #3   Title Pt to verbalize that using an assistive device to walk will improve gait which will decrease her back pain and allow her to ambulate greater distances without pain    Time 2   Period Weeks   Status New   PT SHORT TERM GOAL #4   Title Pt to be able to verbalize that correct sitting posture, and using a towel or lumbar roll, will decrease low back pain for self care of low back pain.    Time 2   Period Weeks   Status New           PT Long Term Goals - 09/10/15 1212    PT LONG TERM GOAL #1   Title PT core and Lt leg strength increased by one grade to allow pt to come sit to stand from a chair in five seconds or less    Time 4   Period Weeks   Status New   PT LONG TERM GOAL #2   Title PT to be able to walk for 15 mintues with LRAD with pain level no greater than a 5/10 for 15 minutes to be able to complete community ambulation including going to the grocery store   Time 4   Period Weeks   Status New   PT LONG TERM GOAL #3   Title Pt to be able to stand for ten minutes to be able to make a small meal/ complete grooming tasks    Time 4   Period Weeks   Status New   PT LONG TERM GOAL #4   Title Pt to be able to sit for 30 mintues without her pain increasing beyond 5/10 to be able to eat a meal in comfort.    Time 4   Period Weeks   Status New               Plan - 09/10/15 1147     Clinical Impression Statement Ms. Dinneen is a 55 yo female who fell in early January causing increased back pain.  Her surgeon states that she must go through four weeks of skilled physical therapy before he will consider another back surgery.  Pt history is complicated by the fact that she has had two previous strokes.  The patient is still smoking and verbalizes that she does not want to quit. She verbalizes that she does not believe that therapy is going to decrease her pain.  She states that she has walkers and canes but she will not use them.  She has been referred to skilled physical therapy for gait training and lower extremity strengthening to improve her back pain.  Examination demonstrates decreased core and lower extremity strerngth, decreased balance,  abnormal gait, decreased activity tolerance and decreased  pain.    Pt will benefit from skilled therapeutic intervention in order to improve on the following deficits Abnormal gait;Decreased activity tolerance;Decreased balance;Decreased mobility;Decreased strength;Difficulty walking;Pain;Postural dysfunction   Rehab Potential Fair   PT Frequency 2x / week   PT Duration 4 weeks   PT Treatment/Interventions ADLs/Self Care Home Management;Balance training;Neuromuscular re-education;Patient/family education;Functional mobility training;Stair training;Gait training;DME Instruction;Therapeutic activities;Therapeutic exercise   PT Next Visit Plan begin gait training.  clam, bridging, scapular retraction, and standing heel raises progress to functional strengthening as able.    PT Home Exercise Plan given    Consulted and Agree with Plan of Care Patient          G-Codes - 02-Oct-2015 1215-09-29    Functional Assessment Tool Used clinical judgement: gait and tolerance to gait    Functional Limitation Mobility: Walking and moving around   Mobility: Walking and Moving Around Current Status [phone]) At least 60 percent but less than 80 percent impaired,  limited or restricted   Mobility: Walking and Moving Around Goal Status [phone]) At least 40 percent but less than 60 percent impaired, limited or restricted       Problem List Patient Active Problem List   Diagnosis Date Noted  . Precordial pain   . Migraine syndrome 01/24/2015  . Migraine 01/24/2015  . Accelerated hypertension 12/25/2014  . TIA (transient ischemic attack) 12/25/2014  . Hypertensive urgency   . Other chest pain   . Malignant hypertension   . Hypokalemia 04/16/2014  . Muscle weakness (generalized) 04/05/2014  . Stiffness of left hip joint 04/05/2014  . Pain in left hip 04/05/2014  . Diastolic dysfunction [id number]  . CAD (coronary artery disease) 03/20/2014  . PUD (peptic ulcer disease) 03/20/2014  . Type 2 diabetes, uncontrolled, with neuropathy ([place]) 03/20/2014  . Morbid obesity ([place]) 03/20/2014  . Left-sided weakness 03/20/2014  . Paresthesias 03/20/2014  . Cerebrovascular disease 03/20/2014  . Dyspnea 03/05/2012  . Chest pain 03/05/2012  . Tobacco abuse 03/05/2012  . COPD (chronic obstructive pulmonary disease) ([place]) 11/23/2011  . GERD (gastroesophageal reflux disease) 11/23/2011  . HTN (hypertension) 11/23/2011    Rayetta Humphrey, PT CLT [phone] 10/02/2015, 12:22 PM  [place] [address] [place], [place], [address] Phone: [phone]   Fax:  [phone]  Name: Katrina [name] MRN: [id number] Date of Birth: Mar 20, 1961

## 2015-09-10 NOTE — Patient Instructions (Signed)
Strengthening: Hip Abductor - Resisted    With band looped around both legs above knees, push thighs apart. Repeat __10__ times per set. Do __1__ sets per session. Do __2__ sessions per day.  http://orth.exer.us/688   Copyright  VHI. All rights reserved.  Gluteal Sets   May do lying on your back with your knees bent.  Tighten buttocks while pressing pelvis to floor. Hold _3___ seconds. Repeat __10__ times per set. Do __1__ sets per session. Do __2__ sessions per day.  http://orth.exer.us/104   Copyright  VHI. All rights reserved.  Lower Abdominals With Diaphragmatic Breathing    Tighten lower abdominals as if tightening belt. Breathe deeply expanding ribs especially in back. Do _10__ times, __3_ times per day.  http://ss.exer.us/26   Copyright  VHI. All rights reserved.

## 2015-09-11 ENCOUNTER — Ambulatory Visit (HOSPITAL_COMMUNITY): Payer: Medicare Other

## 2015-09-11 DIAGNOSIS — M6281 Muscle weakness (generalized): Secondary | ICD-10-CM

## 2015-09-11 DIAGNOSIS — M5442 Lumbago with sciatica, left side: Secondary | ICD-10-CM

## 2015-09-11 DIAGNOSIS — R2689 Other abnormalities of gait and mobility: Secondary | ICD-10-CM

## 2015-09-11 NOTE — Therapy (Signed)
[place], [place], [address] Phone: [phone]   Fax:  [phone]  Physical Therapy Treatment  Patient Details  Name: Katrina [name] MRN: [id number] Date of Birth: [birth year] Referring Provider: Dr. Phillips Odor  Encounter Date: 09/11/2015      PT End of Session - 09/11/15 1658    Visit Number 2   Number of Visits 8   Date for PT Re-Evaluation 10/25/15   Authorization Type Medicare    Authorization Time Period 09/10/2015 to 11/10/2015   PT Start Time 1515   PT Stop Time 1558   PT Time Calculation (min) 43 min   Activity Tolerance Patient tolerated treatment well;Patient limited by pain   Behavior During Therapy [facility] for tasks assessed/performed      Past Medical History  Diagnosis Date  . Type 2 diabetes mellitus ([place])   . Essential hypertension   . Hyperlipidemia   . COPD (chronic obstructive pulmonary disease) ([place])   . GERD (gastroesophageal reflux disease)   . CAD (coronary artery disease)     Moderate nonobstructive 2012-2013, [place]  . Hypothyroid   . Lung nodules   . Diverticulosis   . Bipolar affective ([place])   . Left-sided weakness 03/20/2014    MRI brain negative for stroke.  . 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 Back surgery    . Abdominal hysterectomy    . Appendectomy    . Tonsillectomy    . Ankle surgery    . Cholecystectomy    . Nose surgery    . Cardiac catheterization N/A 01/31/2015    Procedure: Left Heart Cath and Coronary Angiography;  Surgeon: Burnell Blanks, MD;  Location: [place] [facility];  Service: Cardiovascular;  Laterality: N/A;    There were no vitals filed for this visit.      Subjective Assessment - 09/11/15 1517    Subjective Pt c/o LBP that was rated a 7/10 on a VAS upon arrival. No new complaints reported upon arrival.    Pertinent History CVA 2015 as well as October 2016; ; back surgery  2005   How long can you sit comfortably? immediate discomfort    How long can you stand comfortably? Pt states she does not stand; shower are five minutes or less    How long can you walk comfortably? Unable to walk without pain the longest she walks is in her home room to room    Diagnostic tests CT:  bulge disc L3-4; L4-5 ; fusion at L5-S1    Patient Stated Goals Patient's goal is to reduce her LBP.    Currently in Pain? Yes   Pain Score 7    Pain Location Back   Pain Orientation Lower   Pain Descriptors / Indicators Aching;Constant   Pain Type Chronic pain   Pain Radiating Towards L LE which extends down to the L knee    Pain Onset More than a month ago   Pain Frequency Constant   Aggravating Factors  General activity    Pain Relieving Factors pain medications and heating pad    Multiple Pain Sites No            [facility] Adult PT Treatment/Exercise - 09/11/15 0001    Exercises   Exercises Lumbar   Lumbar Exercises: Stretches   Passive Hamstring Stretch 3 reps;30 seconds   Passive Hamstring Stretch Limitations bilateral   Piriformis Stretch 3 reps;30  seconds   Piriformis Stretch Limitations bilateral    Lumbar Exercises: Standing   Heel Raises 15 reps   Heel Raises Limitations UE support on chair    Scapular Retraction Strengthening;Both;10 reps   Row Strengthening;Right;10 reps   Theraband Level (Row) Level 2 (Red)   Other Standing Lumbar Exercises Hip abduction x 1 set of 15 reps, bilateral   Manual assist provided to the L knee during R hip abd   Lumbar Exercises: Supine   Ab Set 10 reps;3 seconds   Clam 10 reps   Clam Limitations 2 sets; 1 set with red thera-tubing    Bridge 10 reps   Other Supine Lumbar Exercises B hip PROM into flex/IR/ER x 10 reps                 PT Education - 09/11/15 1657    Education provided Yes   Education Details HEP, upright posture, core bracing with functional mobility activities, and gait pattern with SPC    Person(s)  Educated Patient   Methods Explanation;Demonstration   Comprehension Verbalized understanding;Returned demonstration          PT Short Term Goals - 09/10/15 1208    PT SHORT TERM GOAL #1   Title Pt to incresse core and  left LE strength by one grade to allow pt to come sit to stand from a chair 8 seconds or less    Time 2   Period Weeks   Status New   PT SHORT TERM GOAL #2   Title Pt back pain to be no greater than a 7/10 to allow pt to be able to walk to the mailbox and back without having increased pain.    Time 2   Period Weeks   Status New   PT SHORT TERM GOAL #3   Title Pt to verbalize that using an assistive device to walk will improve gait which will decrease her back pain and allow her to ambulate greater distances without pain    Time 2   Period Weeks   Status New   PT SHORT TERM GOAL #4   Title Pt to be able to verbalize that correct sitting posture, and using a towel or lumbar roll, will decrease low back pain for self care of low back pain.    Time 2   Period Weeks   Status New           PT Long Term Goals - 09/10/15 1212    PT LONG TERM GOAL #1   Title PT core and Lt leg strength increased by one grade to allow pt to come sit to stand from a chair in five seconds or less    Time 4   Period Weeks   Status New   PT LONG TERM GOAL #2   Title PT to be able to walk for 15 mintues with LRAD with pain level no greater than a 5/10 for 15 minutes to be able to complete community ambulation including going to the grocery store   Time 4   Period Weeks   Status New   PT LONG TERM GOAL #3   Title Pt to be able to stand for ten minutes to be able to make a small meal/ complete grooming tasks    Time 4   Period Weeks   Status New   PT LONG TERM GOAL #4   Title Pt to be able to sit for 30 mintues without her pain increasing beyond 5/10 to be  able to eat a meal in comfort.    Time 4   Period Weeks   Status New               Plan - 09/11/15 1700     Clinical Impression Statement PT tx focused on core stabilization in supine, HS/piriformis stretches, postural strengthening ther ex, and hip abductor/extensor strengthening ther ex. Initiated PT tx with gait assessment with the use of a SPC for ~150' and an additional 100' without the use of an AD. Pt presented with improved R foot clearance and heel to toe sequence with the use of a SPC. Pt noted that she was not interested in using an AD at this time. Therapist educated the pt on the benefits of using a SPC in order to reduce LBP, improve gait pattern, and improve gait tolerance. Pt once again declined recommendation. Pt tolerated all completed ther ex well with minor increase in LBP reported with scap retractions. Pain levels reduced to baseline levels with a brief seated rest break. Pt required verbal and tactile cues for proper completion of ther ex with improved understanding and performance demonstrated by the pt. LBP remained at baseline levels at the end of PT tx. Continue with current POC and progress as tolerated.    Rehab Potential Fair   PT Frequency 2x / week   PT Duration 4 weeks   PT Treatment/Interventions ADLs/Self Care Home Management;Functional mobility training;Gait training;Stair training;DME Instruction;Therapeutic activities;Therapeutic exercise;Balance training;Neuromuscular re-education;Patient/family education   PT Next Visit Plan Next visit to focus on gait training with SPC, HS/piriformis stretches, bridging, core stabilization in supine, and hip abductor/extensor strengthening ther ex    PT Home Exercise Plan HEP reviewed    Consulted and Agree with Plan of Care Patient      Patient will benefit from skilled therapeutic intervention in order to improve the following deficits and impairments:  Abnormal gait, Decreased activity tolerance, Decreased balance, Decreased mobility, Decreased strength, Difficulty walking, Pain, Postural dysfunction  Visit Diagnosis: Muscle  weakness (generalized) - Plan: PT plan of care cert/re-cert  Other abnormalities of gait and mobility - Plan: PT plan of care cert/re-cert  Lumbago with sciatica, left side - Plan: PT plan of care cert/re-cert       G-Codes - 15-Sep-2015 09/12/1215    Functional Assessment Tool Used clinical judgement: gait and tolerance to gait    Functional Limitation Mobility: Walking and moving around   Mobility: Walking and Moving Around Current Status [phone]) At least 60 percent but less than 80 percent impaired, limited or restricted   Mobility: Walking and Moving Around Goal Status [phone]) At least 40 percent but less than 60 percent impaired, limited or restricted      Problem List Patient Active Problem List   Diagnosis Date Noted  . Precordial pain   . Migraine syndrome 01/24/2015  . Migraine 01/24/2015  . Accelerated hypertension 12/25/2014  . TIA (transient ischemic attack) 12/25/2014  . Hypertensive urgency   . Other chest pain   . Malignant hypertension   . Hypokalemia 04/16/2014  . Muscle weakness (generalized) 04/05/2014  . Stiffness of left hip joint 04/05/2014  . Pain in left hip 04/05/2014  . Diastolic dysfunction [id number]  . CAD (coronary artery disease) 03/20/2014  . PUD (peptic ulcer disease) 03/20/2014  . Type 2 diabetes, uncontrolled, with neuropathy ([place]) 03/20/2014  . Morbid obesity ([place]) 03/20/2014  . Left-sided weakness 03/20/2014  . Paresthesias 03/20/2014  . Cerebrovascular disease 03/20/2014  . Dyspnea 03/05/2012  . Chest  pain 03/05/2012  . Tobacco abuse 03/05/2012  . COPD (chronic obstructive pulmonary disease) ([place]) 11/23/2011  . GERD (gastroesophageal reflux disease) 11/23/2011  . HTN (hypertension) 11/23/2011   Garen Lah, PT, DPT 09/11/2015, 6:42 PM  [place], [place], [address] Phone: [phone]   Fax:  [phone]  Name: Katrina [name] MRN: [id number] Date of Birth:  02-12-1961

## 2015-09-15 ENCOUNTER — Ambulatory Visit (HOSPITAL_COMMUNITY): Payer: Medicare Other | Admitting: Physical Therapy

## 2015-09-15 DIAGNOSIS — M6281 Muscle weakness (generalized): Secondary | ICD-10-CM

## 2015-09-15 DIAGNOSIS — R2689 Other abnormalities of gait and mobility: Secondary | ICD-10-CM

## 2015-09-15 DIAGNOSIS — M5442 Lumbago with sciatica, left side: Secondary | ICD-10-CM

## 2015-09-15 NOTE — Therapy (Signed)
[place], [place], [address] Phone: [phone]   Fax:  [phone]  Physical Therapy Treatment  Patient Details  Name: Katrina [name] MRN: [id number] Date of Birth: [birth year] Referring Provider: Dr. Phillips Odor  Encounter Date: 09/15/2015      PT End of Session - 09/15/15 0854    Visit Number 3   Number of Visits 8   Date for PT Re-Evaluation 10/25/15   Authorization Type Medicare    Authorization Time Period 09/10/2015 to 11/10/2015   Authorization - Visit Number 3   Authorization - Number of Visits 10   PT Start Time 0815   PT Stop Time 0845   PT Time Calculation (min) 30 min   Activity Tolerance Patient tolerated treatment well;Patient limited by pain   Behavior During Therapy [facility] for tasks assessed/performed      Past Medical History  Diagnosis Date  . Type 2 diabetes mellitus ([place])   . Essential hypertension   . Hyperlipidemia   . COPD (chronic obstructive pulmonary disease) ([place])   . GERD (gastroesophageal reflux disease)   . CAD (coronary artery disease)     Moderate nonobstructive 2012-2013, [place]  . Hypothyroid   . Lung nodules   . Diverticulosis   . Bipolar affective ([place])   . Left-sided weakness 03/20/2014    MRI brain negative for stroke.  . Stroke (cerebrum) [facility]) october 2016    Past Surgical History  Procedure Laterality Date  . Back surgery    . Abdominal hysterectomy    . Appendectomy    . Tonsillectomy    . Ankle surgery    . Cholecystectomy    . Nose surgery    . Cardiac catheterization N/A 01/31/2015    Procedure: Left Heart Cath and Coronary Angiography;  Surgeon: Burnell Blanks, MD;  Location: [place] [facility];  Service: Cardiovascular;  Laterality: N/A;    There were no vitals filed for this visit.      Subjective Assessment - 09/15/15 0828    Subjective Pt states her pain is a little lower, 5/10.  Reports noncompliance with HEP and states she will  not do them, ever, as she does not exercise.    Currently in Pain? Yes   Pain Score 4    Pain Location Back   Pain Orientation Lower   Pain Descriptors / Indicators Aching;Constant                         [facility] Adult PT Treatment/Exercise - 09/15/15 0829    Lumbar Exercises: Stretches   Passive Hamstring Stretch 3 reps;10 seconds   Passive Hamstring Stretch Limitations supine with rope bilateral   Piriformis Stretch 1 rep   Piriformis Stretch Limitations no stretch obtained per patient with full ROM   Lumbar Exercises: Standing   Scapular Retraction Limitations pt unable to complete this   Other Standing Lumbar Exercises Hip abduction and extension x 2 set of 15 reps, bilateral    Lumbar Exercises: Supine   Clam 15 reps   Bridge 15 reps   Straight Leg Raise 10 reps   Straight Leg Raises Limitations bilaterally   Lumbar Exercises: Sidelying   Hip Abduction 10 reps   Hip Abduction Limitations bilaterally                  PT Short Term Goals - 09/10/15 1208    PT SHORT TERM GOAL #1   Title Pt  to incresse core and  left LE strength by one grade to allow pt to come sit to stand from a chair 8 seconds or less    Time 2   Period Weeks   Status New   PT SHORT TERM GOAL #2   Title Pt back pain to be no greater than a 7/10 to allow pt to be able to walk to the mailbox and back without having increased pain.    Time 2   Period Weeks   Status New   PT SHORT TERM GOAL #3   Title Pt to verbalize that using an assistive device to walk will improve gait which will decrease her back pain and allow her to ambulate greater distances without pain    Time 2   Period Weeks   Status New   PT SHORT TERM GOAL #4   Title Pt to be able to verbalize that correct sitting posture, and using a towel or lumbar roll, will decrease low back pain for self care of low back pain.    Time 2   Period Weeks   Status New           PT Long Term Goals - 09/10/15 1212    PT  LONG TERM GOAL #1   Title PT core and Lt leg strength increased by one grade to allow pt to come sit to stand from a chair in five seconds or less    Time 4   Period Weeks   Status New   PT LONG TERM GOAL #2   Title PT to be able to walk for 15 mintues with LRAD with pain level no greater than a 5/10 for 15 minutes to be able to complete community ambulation including going to the grocery store   Time 4   Period Weeks   Status New   PT LONG TERM GOAL #3   Title Pt to be able to stand for ten minutes to be able to make a small meal/ complete grooming tasks    Time 4   Period Weeks   Status New   PT LONG TERM GOAL #4   Title Pt to be able to sit for 30 mintues without her pain increasing beyond 5/10 to be able to eat a meal in comfort.    Time 4   Period Weeks   Status New               Plan - 09/15/15 [id number]    Clinical Impression Statement Continued focus on core stab.  Several exercises attempted however patient stated "can't do it" with little effort given.  Unable to move Lt LE with therabands, however able to pull Lt LE up with SLR's using her Lt UE.  Pt adamantly stated she would not complete her HEP and she is only coming here because her MD ordered it for her back.  suggested OT for Lt UE, however refused.  Discussed importance of completing HEP for optimal results.  Encouarged patient to begin yoga or water aerobics as she has a Forensic scientist.  Pt without antalgia when entered clinic, however noted antalgia when exiting clinic, however ceased when left clinic to walk to car.  Session ended early due to noncompliance.     Rehab Potential Fair   PT Frequency 2x / week   PT Duration 4 weeks   PT Treatment/Interventions ADLs/Self Care Home Management;Functional mobility training;Gait training;Stair training;DME Instruction;Therapeutic activities;Therapeutic exercise;Balance training;Neuromuscular re-education;Patient/family education   PT Next Visit Plan  Next visit to focus on  gait training with SPC, HS/piriformis stretches, bridging, core stabilization in supine, and hip abductor/extensor strengthening ther ex    Consulted and Agree with Plan of Care Patient      Patient will benefit from skilled therapeutic intervention in order to improve the following deficits and impairments:  Abnormal gait, Decreased activity tolerance, Decreased balance, Decreased mobility, Decreased strength, Difficulty walking, Pain, Postural dysfunction  Visit Diagnosis: Muscle weakness (generalized)  Other abnormalities of gait and mobility  Lumbago with sciatica, left side     Problem List Patient Active Problem List   Diagnosis Date Noted  . Precordial pain   . Migraine syndrome 01/24/2015  . Migraine 01/24/2015  . Accelerated hypertension 12/25/2014  . TIA (transient ischemic attack) 12/25/2014  . Hypertensive urgency   . Other chest pain   . Malignant hypertension   . Hypokalemia 04/16/2014  . Muscle weakness (generalized) 04/05/2014  . Stiffness of left hip joint 04/05/2014  . Pain in left hip 04/05/2014  . Diastolic dysfunction [id number]  . CAD (coronary artery disease) 03/20/2014  . PUD (peptic ulcer disease) 03/20/2014  . Type 2 diabetes, uncontrolled, with neuropathy ([place]) 03/20/2014  . Morbid obesity ([place]) 03/20/2014  . Left-sided weakness 03/20/2014  . Paresthesias 03/20/2014  . Cerebrovascular disease 03/20/2014  . Dyspnea 03/05/2012  . Chest pain 03/05/2012  . Tobacco abuse 03/05/2012  . COPD (chronic obstructive pulmonary disease) ([place]) 11/23/2011  . GERD (gastroesophageal reflux disease) 11/23/2011  . HTN (hypertension) 11/23/2011    Teena Irani, PTA/CLT [phone]  09/15/2015, 9:02 AM  [place], [place], [address] Phone: [phone]   Fax:  [phone]  Name: Katrina [name] MRN: [id number] Date of Birth: [birth year]

## 2015-09-17 ENCOUNTER — Ambulatory Visit (HOSPITAL_COMMUNITY): Payer: Self-pay | Admitting: Psychiatry

## 2015-09-26 ENCOUNTER — Ambulatory Visit (HOSPITAL_COMMUNITY): Payer: Medicare Other | Admitting: Physical Therapy

## 2015-09-26 DIAGNOSIS — R2689 Other abnormalities of gait and mobility: Secondary | ICD-10-CM

## 2015-09-26 DIAGNOSIS — M5442 Lumbago with sciatica, left side: Secondary | ICD-10-CM

## 2015-09-26 DIAGNOSIS — M6281 Muscle weakness (generalized): Secondary | ICD-10-CM

## 2015-09-26 NOTE — Therapy (Signed)
[place], [place], [address] Phone: [phone]   Fax:  [phone]  Physical Therapy Treatment  Patient Details  Name: Katrina [name] MRN: [id number] Date of Birth: 11/03/1960 Referring Provider: Dr. Phillips Odor  Encounter Date: 09/26/2015      PT End of Session - 09/26/15 0853    Visit Number 4   Number of Visits 8   Date for PT Re-Evaluation 10/25/15   Authorization Type Medicare    Authorization Time Period 09/10/2015 to 11/10/2015   Authorization - Visit Number 4   Authorization - Number of Visits 10   PT Start Time 0812   PT Stop Time 0850   PT Time Calculation (min) 38 min   Activity Tolerance Patient tolerated treatment well   Behavior During Therapy [facility] for tasks assessed/performed      Past Medical History  Diagnosis Date  . Type 2 diabetes mellitus ([place])   . Essential hypertension   . Hyperlipidemia   . COPD (chronic obstructive pulmonary disease) ([place])   . GERD (gastroesophageal reflux disease)   . CAD (coronary artery disease)     Moderate nonobstructive 2012-2013, [place]  . Hypothyroid   . Lung nodules   . Diverticulosis   . Bipolar affective ([place])   . Left-sided weakness 03/20/2014    MRI brain negative for stroke.  . Stroke (cerebrum) [facility]) october 2016    Past Surgical History  Procedure Laterality Date  . Back surgery    . Abdominal hysterectomy    . Appendectomy    . Tonsillectomy    . Ankle surgery    . Cholecystectomy    . Nose surgery    . Cardiac catheterization N/A 01/31/2015    Procedure: Left Heart Cath and Coronary Angiography;  Surgeon: Burnell Blanks, MD;  Location: [place] [facility];  Service: Cardiovascular;  Laterality: N/A;    There were no vitals filed for this visit.      Subjective Assessment - 09/26/15 0815    Subjective Pt states she is experiencing 5/10 pain today. She says she continues to have difficulty with walking. Says she was in  [place] the past week and seemed to do ok when using her SPC.   Pertinent History CVA 2015 as well as October 2016; ; back surgery 2005   Currently in Pain? Yes   Pain Score 5    Pain Location Back   Pain Orientation Lower   Pain Descriptors / Indicators Aching;Throbbing   Pain Type Chronic pain   Pain Radiating Towards L knee and sometimes down to the foot   Pain Onset More than a month ago   Pain Frequency Constant   Aggravating Factors  General activity   Pain Relieving Factors pain meds and heating pad   Multiple Pain Sites No                         [facility] Adult PT Treatment/Exercise - 09/26/15 0001    Transfers   Comments demonstrating log roll technique x4 with pt needing minimal cues for correction   Lumbar Exercises: Stretches   Passive Hamstring Stretch 3 reps;20 seconds  LLE only, pt reporting she is unale to stand on LLE   Passive Hamstring Stretch Limitations standing   Lumbar Exercises: Standing   Wall Slides 5 reps  x2 sets with UE support   Wall Slides Limitations Rt weight shift   Lumbar Exercises: Supine   Bridge  10 reps   Bridge Limitations x2 sets; difficulty with end range extension   Manual Therapy   Manual Therapy Soft tissue mobilization;Passive ROM  2 pillows under hips in prone position   Manual therapy comments performed separately from all interventions   Soft tissue mobilization Piriformis TrP release, STM along Lt distal ITB    Passive ROM Rt LE hamstring stretch x75min                PT Education - 09/26/15 0852    Education provided Yes   Education Details discussed corerct technique with activities, reviewed log roll technique with pt returning demonstration   Person(s) Educated Patient   Methods Explanation;Demonstration   Comprehension Verbalized understanding;Returned demonstration;Need further instruction          PT Short Term Goals - 09/10/15 1208    PT SHORT TERM GOAL #1   Title Pt to incresse core and   left LE strength by one grade to allow pt to come sit to stand from a chair 8 seconds or less    Time 2   Period Weeks   Status New   PT SHORT TERM GOAL #2   Title Pt back pain to be no greater than a 7/10 to allow pt to be able to walk to the mailbox and back without having increased pain.    Time 2   Period Weeks   Status New   PT SHORT TERM GOAL #3   Title Pt to verbalize that using an assistive device to walk will improve gait which will decrease her back pain and allow her to ambulate greater distances without pain    Time 2   Period Weeks   Status New   PT SHORT TERM GOAL #4   Title Pt to be able to verbalize that correct sitting posture, and using a towel or lumbar roll, will decrease low back pain for self care of low back pain.    Time 2   Period Weeks   Status New           PT Long Term Goals - 09/10/15 1212    PT LONG TERM GOAL #1   Title PT core and Lt leg strength increased by one grade to allow pt to come sit to stand from a chair in five seconds or less    Time 4   Period Weeks   Status New   PT LONG TERM GOAL #2   Title PT to be able to walk for 15 mintues with LRAD with pain level no greater than a 5/10 for 15 minutes to be able to complete community ambulation including going to the grocery store   Time 4   Period Weeks   Status New   PT LONG TERM GOAL #3   Title Pt to be able to stand for ten minutes to be able to make a small meal/ complete grooming tasks    Time 4   Period Weeks   Status New   PT LONG TERM GOAL #4   Title Pt to be able to sit for 30 mintues without her pain increasing beyond 5/10 to be able to eat a meal in comfort.    Time 4   Period Weeks   Status New               Plan - 09/26/15 0854    Clinical Impression Statement Today's session focused on therex to improve hip flexibility and strength as well as manual  treatment to improve soft tissue restrictions and pain. Pt reluctant to try several exercises and reporting  several times throughout her session that she was unable to do what was asked of her, with therapist providing increased encouragement to complete. Pt tolerated all activities without increased pain, and only some discomfort noted during manual treatment to ITB and piriformis. Therapist reviewed the importance of log rolling and she was able to demonstrate proper technique by the end of today's session. Will continue with current POC.   Rehab Potential Fair   PT Frequency 2x / week   PT Duration 4 weeks   PT Treatment/Interventions ADLs/Self Care Home Management;Functional mobility training;Gait training;Stair training;DME Instruction;Therapeutic activities;Therapeutic exercise;Balance training;Neuromuscular re-education;Patient/family education   PT Next Visit Plan bridging, core stabilization in supine, and hip abductor/extensor strengthening ther ex; manual to improve ITB/L glute pain and restrictions   PT Home Exercise Plan no additions this visit   Consulted and Agree with Plan of Care Patient      Patient will benefit from skilled therapeutic intervention in order to improve the following deficits and impairments:  Abnormal gait, Decreased activity tolerance, Decreased balance, Decreased mobility, Decreased strength, Difficulty walking, Pain, Postural dysfunction  Visit Diagnosis: Muscle weakness (generalized)  Other abnormalities of gait and mobility  Lumbago with sciatica, left side     Problem List Patient Active Problem List   Diagnosis Date Noted  . Precordial pain   . Migraine syndrome 01/24/2015  . Migraine 01/24/2015  . Accelerated hypertension 12/25/2014  . TIA (transient ischemic attack) 12/25/2014  . Hypertensive urgency   . Other chest pain   . Malignant hypertension   . Hypokalemia 04/16/2014  . Muscle weakness (generalized) 04/05/2014  . Stiffness of left hip joint 04/05/2014  . Pain in left hip 04/05/2014  . Diastolic dysfunction [id number]  . CAD (coronary  artery disease) 03/20/2014  . PUD (peptic ulcer disease) 03/20/2014  . Type 2 diabetes, uncontrolled, with neuropathy ([place]) 03/20/2014  . Morbid obesity ([place]) 03/20/2014  . Left-sided weakness 03/20/2014  . Paresthesias 03/20/2014  . Cerebrovascular disease 03/20/2014  . Dyspnea 03/05/2012  . Chest pain 03/05/2012  . Tobacco abuse 03/05/2012  . COPD (chronic obstructive pulmonary disease) ([place]) 11/23/2011  . GERD (gastroesophageal reflux disease) 11/23/2011  . HTN (hypertension) 11/23/2011   9:16 AM,09/26/2015 Elly Modena PT, DPT [facility] [place] [address] [place], [place], [address] Phone: [phone]   Fax:  [phone]  Name: Katrina [name] MRN: [id number] Date of Birth: [birth year]

## 2015-09-30 ENCOUNTER — Ambulatory Visit (HOSPITAL_COMMUNITY): Payer: Medicare Other | Admitting: Physical Therapy

## 2015-09-30 DIAGNOSIS — M6281 Muscle weakness (generalized): Secondary | ICD-10-CM

## 2015-09-30 DIAGNOSIS — M5442 Lumbago with sciatica, left side: Secondary | ICD-10-CM

## 2015-09-30 DIAGNOSIS — R2689 Other abnormalities of gait and mobility: Secondary | ICD-10-CM

## 2015-09-30 NOTE — Therapy (Signed)
[place], [place], [address] Phone: [phone]   Fax:  [phone]  Physical Therapy Treatment  Patient Details  Name: JENSYN SHAVE MRN: [id number] Date of Birth: 06/10/1960 Referring Provider: Phillips Odor  Encounter Date: 09/30/2015      PT End of Session - 09/30/15 1613    Behavior During Therapy WFL for tasks assessed/performed      Past Medical History  Diagnosis Date  . Type 2 diabetes mellitus ([place])   . Essential hypertension   . Hyperlipidemia   . COPD (chronic obstructive pulmonary disease) ([place])   . GERD (gastroesophageal reflux disease)   . CAD (coronary artery disease)     Moderate nonobstructive 2012-2013, [place]  . Hypothyroid   . Lung nodules   . Diverticulosis   . Bipolar affective ([place])   . Left-sided weakness 03/20/2014    MRI brain negative for stroke.  . Stroke (cerebrum) [facility]) october 2016    Past Surgical History  Procedure Laterality Date  . Back surgery    . Abdominal hysterectomy    . Appendectomy    . Tonsillectomy    . Ankle surgery    . Cholecystectomy    . Nose surgery    . Cardiac catheterization N/A 01/31/2015    Procedure: Left Heart Cath and Coronary Angiography;  Surgeon: Burnell Blanks, MD;  Location: [place] [facility];  Service: Cardiovascular;  Laterality: N/A;    There were no vitals filed for this visit.      Subjective Assessment - 09/30/15 1532    Subjective Pt states that her exercises do not seem to be hleping.  She is completing them once a day.    Pertinent History CVA 2015 as well as October 2016; ; back surgery 2005   How long can you sit comfortably? Pt has increased pain after sitting 10-15 minutes    How long can you stand comfortably? Pt states she does not stand; shower are five minutes or less ; no change    How long can you walk comfortably? Unable to walk without pain the longest she walks is in her home room to room >  no  change    Diagnostic tests CT:  bulge disc L3-4; L4-5 ; fusion at L5-S1    Patient Stated Goals Patient's goal is to reduce her LBP.    Pain Score 9    Pain Location Back   Pain Orientation Lower   Pain Descriptors / Indicators Aching;Throbbing   Pain Type Chronic pain   Pain Radiating Towards radiating into her foot on occasionally; normally to the knee    Pain Onset More than a month ago            [facility] PT Assessment - 09/30/15 0001    Assessment   Medical Diagnosis abnormal gait    Referring Provider Kofi Doonquah   Onset Date/Surgical Date 06/15/15   Hand Dominance Right   Next MD Visit --  10/03/2015   Prior Therapy home health    Precautions   Precautions Fall   Restrictions   Weight Bearing Restrictions No   [place] residence   Type of [place] to enter   Entrance Stairs-Number of Steps [place] --  lives in the [place] - 2 wheels;Cane - single point   Prior Function   Level of Independence Independent  with basic ADLs   Vocation On disability   Leisure crochet; sew    Cognition   Overall Cognitive Status Within Functional Limits for tasks assessed   Observation/Other Assessments   Focus on Therapeutic Outcomes (FOTO)  23  was 47   Functional Tests   Functional tests Single leg stance;Sit to Stand   Single Leg Stance   Comments RT:  1 second ; Lt 0   Sit to Stand   Comments --   Strength   Right Hip Flexion 3+/5  was 2+/5    Right Hip Extension 2/5  was 2-/5    Right Hip ABduction 3/5  was 2/5    Left Hip Flexion 2+/5  was 2-/5   Left Hip Extension 2/5  was 2-/5    Left Hip ABduction 2-/5   Right Knee Extension 3+/5   Left Knee Extension 3/5  was 3-/5   Right Ankle Dorsiflexion 3+/5  was 3+/5    Left Ankle Dorsiflexion 3/5  cogwheel ; was 3/5    Ambulation/Gait   Ambulation Distance (Feet) 35 Feet   Assistive device None   Gait Pattern  Decreased arm swing - left;Decreased step length - right;Left circumduction;Antalgic   Gait Comments slow to advance Lt LE                      [facility] Adult PT Treatment/Exercise - 09/30/15 0001    Lumbar Exercises: Stretches   Passive Hamstring Stretch 3 reps;30 seconds   Single Knee to Chest Stretch 2 reps;30 seconds   Single Knee to Chest Stretch Limitations using towel    Lumbar Exercises: Supine   Ab Set 10 reps   Glut Set 15 reps   Bridge 10 reps  attempted but pt unable to get any clearance.    Other Supine Lumbar Exercises decompession 1-5 x5   Other Supine Lumbar Exercises isometeric hip adduction/abduction  x 10                  PT Short Term Goals - 09/30/15 1606    PT SHORT TERM GOAL #1   Title Pt to incresse core and  left LE strength by one grade to allow pt to come sit to stand from a chair 8 seconds or less    Time 2   Period Weeks   Status Achieved   PT SHORT TERM GOAL #2   Title Pt back pain to be no greater than a 7/10 to allow pt to be able to walk to the mailbox and back without having increased pain.    Time 2   Period Weeks   Status Not Met   PT SHORT TERM GOAL #3   Title Pt to verbalize that using an assistive device to walk will improve gait which will decrease her back pain and allow her to ambulate greater distances without pain   pt states she does not want to use the cane.    Time 2   Period Weeks   Status Not Met   PT SHORT TERM GOAL #4   Title Pt to be able to verbalize that correct sitting posture, and using a towel or lumbar roll, will decrease low back pain for self care of low back pain.    Time 2   Period Weeks   Status On-going           PT Long Term Goals - 09/30/15 1609    PT LONG TERM GOAL #1  Title PT core and Lt leg strength increased by one grade to allow pt to come sit to stand from a chair in five seconds or less    Time 4   Period Weeks   Status Achieved   PT LONG TERM GOAL #2   Title PT to be  able to walk for 15 mintues with LRAD with pain level no greater than a 5/10 for 15 minutes to be able to complete community ambulation including going to the grocery store   Time 4   Period Weeks   Status On-going   PT LONG TERM GOAL #3   Title Pt to be able to stand for ten minutes to be able to make a small meal/ complete grooming tasks    Time 4   Period Weeks   Status On-going   PT LONG TERM GOAL #4   Title Pt to be able to sit for 30 mintues without her pain increasing beyond 5/10 to be able to eat a meal in comfort.    Time 4   Period Weeks   Status On-going               Plan - Oct 23, 2015 1557    Clinical Impression Statement Pt to MD tomorrow pt shows minimal gains.  Pt unable to lift bottom up at all to perform a bridge but was able to come sit to stand. Pt continues to feel that the only thing that is going to help her at this time is surgery.     Rehab Potential Fair   PT Frequency 2x / week   PT Duration 4 weeks   PT Next Visit Plan minimal improvement recommend discharge from therapy/      Patient will benefit from skilled therapeutic intervention in order to improve the following deficits and impairments:  Abnormal gait, Decreased activity tolerance, Decreased balance, Decreased mobility, Decreased strength, Difficulty walking, Pain, Postural dysfunction  Visit Diagnosis: Muscle weakness (generalized)  Other abnormalities of gait and mobility  Lumbago with sciatica, left side       G-Codes - 2015/10/23 1611    Functional Assessment Tool Used clinical judgement: gait and tolerance to gait    Functional Limitation Mobility: Walking and moving around   Mobility: Walking and Moving Around Goal Status [phone]) At least 40 percent but less than 60 percent impaired, limited or restricted   Mobility: Walking and Moving Around Discharge Status [phone]) At least 40 percent but less than 60 percent impaired, limited or restricted      Problem List Patient Active  Problem List   Diagnosis Date Noted  . Precordial pain   . Migraine syndrome 01/24/2015  . Migraine 01/24/2015  . Accelerated hypertension 12/25/2014  . TIA (transient ischemic attack) 12/25/2014  . Hypertensive urgency   . Other chest pain   . Malignant hypertension   . Hypokalemia 04/16/2014  . Muscle weakness (generalized) 04/05/2014  . Stiffness of left hip joint 04/05/2014  . Pain in left hip 04/05/2014  . Diastolic dysfunction 56/31/4970  . CAD (coronary artery disease) 03/20/2014  . PUD (peptic ulcer disease) 03/20/2014  . Type 2 diabetes, uncontrolled, with neuropathy ([place]) 03/20/2014  . Morbid obesity ([place]) 03/20/2014  . Left-sided weakness 03/20/2014  . Paresthesias 03/20/2014  . Cerebrovascular disease 03/20/2014  . Dyspnea 03/05/2012  . Chest pain 03/05/2012  . Tobacco abuse 03/05/2012  . COPD (chronic obstructive pulmonary disease) ([place]) 11/23/2011  . GERD (gastroesophageal reflux disease) 11/23/2011  . HTN (hypertension) 11/23/2011  Rayetta Humphrey, PT CLT [phone] 09/30/2015, 4:23 PM  [facility] [address] [place], [place], [address] Phone: [phone]   Fax:  [phone]  Name: ZONIE CRUTCHER MRN: [id number] Date of Birth: 06-Nov-1960   PHYSICAL THERAPY DISCHARGE SUMMARY  Visits from Start of Care: 5  Current functional level related to goals / functional outcomes: See above   Remaining deficits: Continued weakness; high level of pain    Education / Equipment: HEP  Plan: Patient agrees to discharge.  Patient goals were not met. Patient is being discharged due to lack of progress.  ?????  Rayetta Humphrey, [place] CLT [phone]

## 2015-10-01 DIAGNOSIS — R251 Tremor, unspecified: Secondary | ICD-10-CM | POA: Diagnosis not present

## 2015-10-01 DIAGNOSIS — G2581 Restless legs syndrome: Secondary | ICD-10-CM | POA: Diagnosis not present

## 2015-10-01 DIAGNOSIS — F5105 Insomnia due to other mental disorder: Secondary | ICD-10-CM | POA: Diagnosis not present

## 2015-10-01 DIAGNOSIS — Z79899 Other long term (current) drug therapy: Secondary | ICD-10-CM | POA: Diagnosis not present

## 2015-10-01 DIAGNOSIS — E119 Type 2 diabetes mellitus without complications: Secondary | ICD-10-CM | POA: Diagnosis not present

## 2015-10-01 DIAGNOSIS — G4459 Other complicated headache syndrome: Secondary | ICD-10-CM | POA: Diagnosis not present

## 2015-10-01 DIAGNOSIS — I10 Essential (primary) hypertension: Secondary | ICD-10-CM | POA: Diagnosis not present

## 2015-10-01 DIAGNOSIS — M545 Low back pain: Secondary | ICD-10-CM | POA: Diagnosis not present

## 2015-10-01 DIAGNOSIS — M5416 Radiculopathy, lumbar region: Secondary | ICD-10-CM | POA: Diagnosis not present

## 2015-10-01 DIAGNOSIS — I6980 Unspecified sequelae of other cerebrovascular disease: Secondary | ICD-10-CM | POA: Diagnosis not present

## 2015-10-01 DIAGNOSIS — M25511 Pain in right shoulder: Secondary | ICD-10-CM | POA: Diagnosis not present

## 2015-10-01 DIAGNOSIS — M4726 Other spondylosis with radiculopathy, lumbar region: Secondary | ICD-10-CM | POA: Diagnosis not present

## 2015-10-02 ENCOUNTER — Ambulatory Visit (HOSPITAL_COMMUNITY): Payer: Medicare Other | Admitting: Physical Therapy

## 2015-10-06 ENCOUNTER — Encounter (HOSPITAL_COMMUNITY): Payer: Self-pay | Admitting: Psychiatry

## 2015-10-06 ENCOUNTER — Ambulatory Visit (INDEPENDENT_AMBULATORY_CARE_PROVIDER_SITE_OTHER): Payer: 59 | Admitting: Psychiatry

## 2015-10-06 VITALS — BP 187/109 | HR 75 | Ht 66.0 in | Wt 204.6 lb

## 2015-10-06 DIAGNOSIS — F4310 Post-traumatic stress disorder, unspecified: Secondary | ICD-10-CM | POA: Diagnosis not present

## 2015-10-06 MED ORDER — DULOXETINE HCL 60 MG PO CPEP: 60.0000 mg | ORAL_CAPSULE | Freq: Every day | ORAL | Status: DC

## 2015-10-06 MED ORDER — PRAZOSIN HCL 5 MG PO CAPS: 5.0000 mg | ORAL_CAPSULE | Freq: Every day | ORAL | Status: DC
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
## 2015-10-31 DIAGNOSIS — M545 Low back pain: Secondary | ICD-10-CM | POA: Diagnosis not present

## 2015-10-31 DIAGNOSIS — G2581 Restless legs syndrome: Secondary | ICD-10-CM | POA: Diagnosis not present

## 2015-10-31 DIAGNOSIS — M4726 Other spondylosis with radiculopathy, lumbar region: Secondary | ICD-10-CM | POA: Diagnosis not present

## 2015-10-31 DIAGNOSIS — Z79899 Other long term (current) drug therapy: Secondary | ICD-10-CM | POA: Diagnosis not present

## 2015-10-31 DIAGNOSIS — E119 Type 2 diabetes mellitus without complications: Secondary | ICD-10-CM | POA: Diagnosis not present

## 2015-10-31 DIAGNOSIS — R251 Tremor, unspecified: Secondary | ICD-10-CM | POA: Diagnosis not present

## 2015-10-31 DIAGNOSIS — I10 Essential (primary) hypertension: Secondary | ICD-10-CM | POA: Diagnosis not present

## 2015-10-31 DIAGNOSIS — G4459 Other complicated headache syndrome: Secondary | ICD-10-CM | POA: Diagnosis not present

## 2015-10-31 DIAGNOSIS — F5105 Insomnia due to other mental disorder: Secondary | ICD-10-CM | POA: Diagnosis not present

## 2015-10-31 DIAGNOSIS — R2689 Other abnormalities of gait and mobility: Secondary | ICD-10-CM | POA: Diagnosis not present

## 2015-10-31 DIAGNOSIS — M25511 Pain in right shoulder: Secondary | ICD-10-CM | POA: Diagnosis not present

## 2015-10-31 DIAGNOSIS — I6980 Unspecified sequelae of other cerebrovascular disease: Secondary | ICD-10-CM | POA: Diagnosis not present

## 2015-11-04 ENCOUNTER — Ambulatory Visit (HOSPITAL_COMMUNITY): Payer: Self-pay | Admitting: Psychiatry

## 2015-11-20 IMAGING — CT CT ANGIO HEAD
1 of 8 series · 3 of 33 positions shown · IV contrast (Omnipaque 300)
Comparison: CT of the head March 18, 2014 at 4842 hr and CT of
the head February 27, 2014

CLINICAL DATA: One day history of headache, sharp head pain
beginning 1 hr ago. One day history of numbness and weakness LEFT
extremities. Past medical history of hypertension and transient
ischemic attack.



[Series 6: cta head/neck pac · axial · 0.40mm/px · z∈[+19,+371]mm · 3 of 177 slices shown]
[im 1/177  soft-tissue]
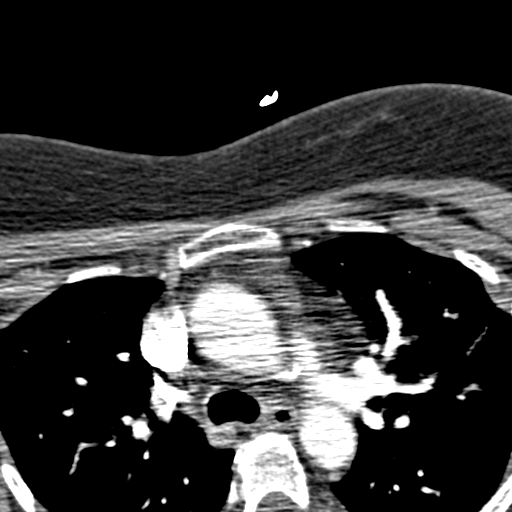
[im 89/177  bone]
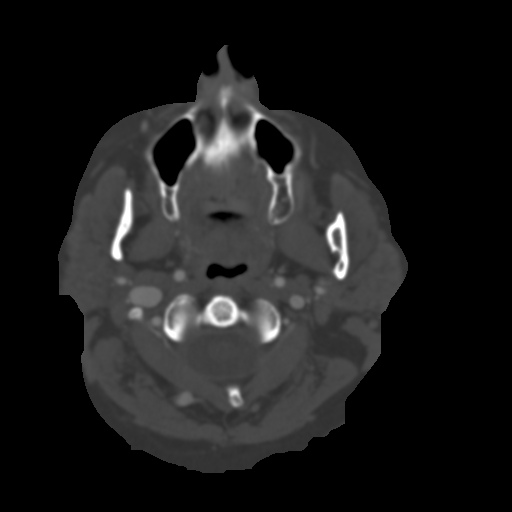
[im 177/177  soft-tissue]
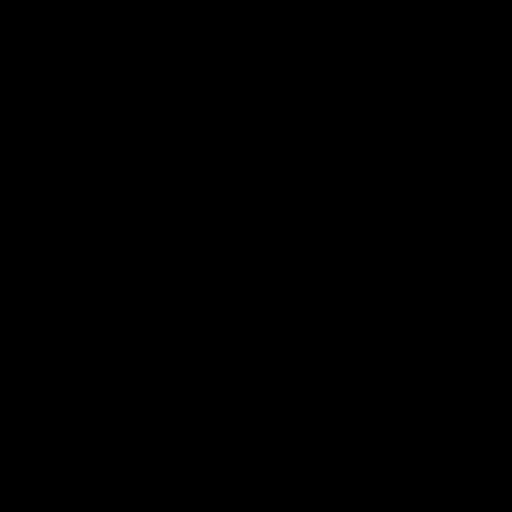

[3 of 33 positions shown; findings below may reference images not displayed]

FINDINGS: CTA HEAD FINDINGS

Anterior circulation: Normal appearance of the cervical internal
carotid arteries, petrous, cavernous and supra clinoid internal
carotid arteries. Anterior communicating artery is not distinctly
identified. Normal appearance of the anterior and middle cerebral
arteries.

Posterior circulation: RIGHT vertebral artery is dominant with
normal appearance of the vertebral arteries, vertebrobasilar
junction and basilar artery, as well as main branch vessels. Normal
appearance of the posterior cerebral arteries. Tiny RIGHT posterior
communicating artery present.

No large vessel occlusion, hemodynamically significant stenosis,
dissection, luminal irregularity, contrast extravasation or aneurysm
within the anterior nor posterior circulation.

Review of the MIP images confirms the above findings.

CTA NECK FINDINGS

Normal appearance of the thoracic arch, normal branch pattern. Mild
calcific atherosclerosis The origins of the innominate, left Common
carotid artery and subclavian artery are widely patent.

Bilateral Common carotid arteries are widely patent, coursing in a
straight line fashion. Eccentric intimal thickening calcific
atherosclerosis of the carotid bulbs measuring up to 3 mm on the
LEFT without hemodynamically significant stenosis by NASCET
criteria. Normal appearance of the included internal carotid
arteries.

RIGHT vertebral artery is dominant. Normal appearance of the
vertebral arteries, which appear widely patent.

No hemodynamically significant stenosis by NASCET criteria. No
dissection, no pseudoaneurysm. No abnormal luminal irregularity. No
contrast extravasation.

Soft tissues are unremarkable. No acute osseous process though bone
windows have not been submitted.

Review of the MIP images confirms the above findings.
IMPRESSION: CTA HEAD: No acute vascular process nor hemodynamically significant
stenosis.

CTA NECK: No acute vascular process. Atherosclerosis of the carotid
bulbs without hemodynamically significant stenosis.

  By: De Gaulle Davina

## 2015-11-20 IMAGING — CT CT HEAD W/O CM
1 series · 16 of 30 positions shown, 20 images · non-contrast
Comparison: Head CT scan 02/27/2014.

CLINICAL DATA: Acute onset headache 1 hr ago.

EXAM:
CT HEAD WITHOUT CONTRAST
TECHNIQUE: Contiguous axial images were obtained from the base of the skull
through the vertex without intravenous contrast.

[Series 2: headseq 4.8 h37s · axial · 0.43mm/px · z∈[+275,+404]mm · 16 of 30 slices shown, 20 images]
[im 2/30  brain]
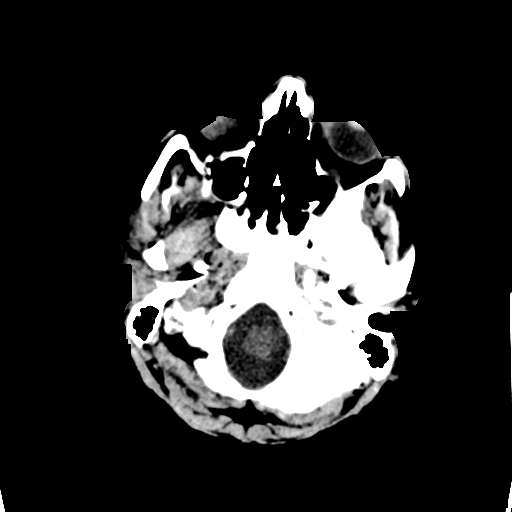
[im 2/30  bone]
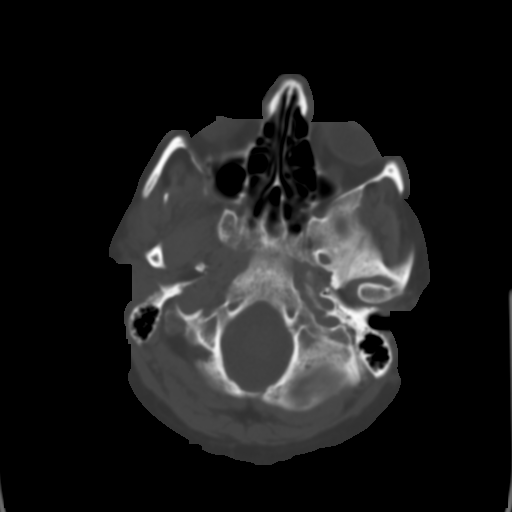
[im 4/30  brain]
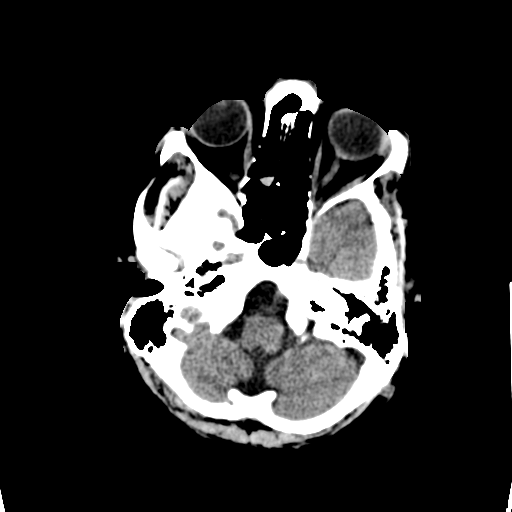
[im 6/30  brain]
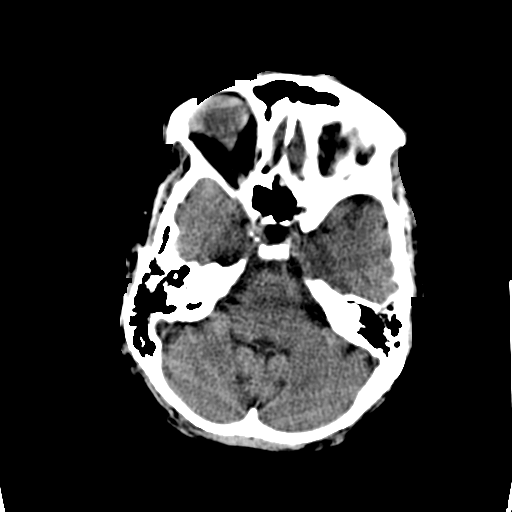
[im 8/30  brain]
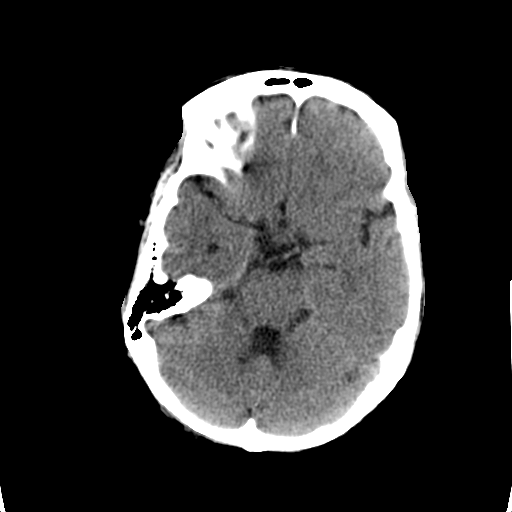
[im 9/30  brain]
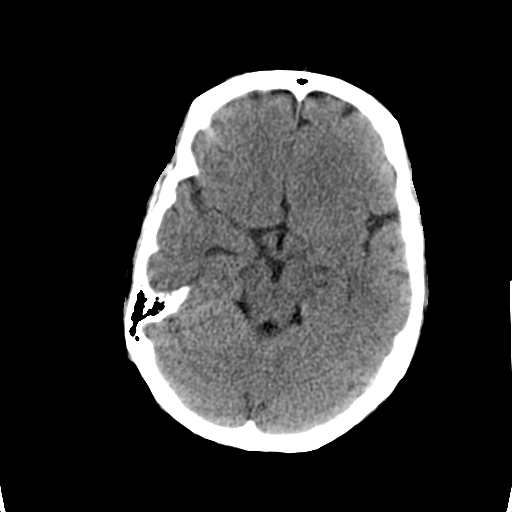
[im 9/30  bone]
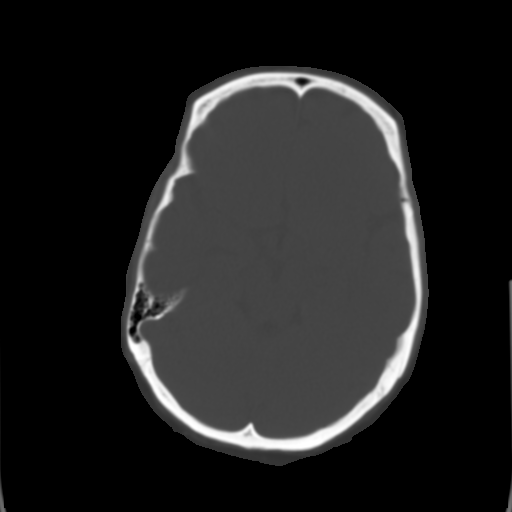
[im 11/30  brain]
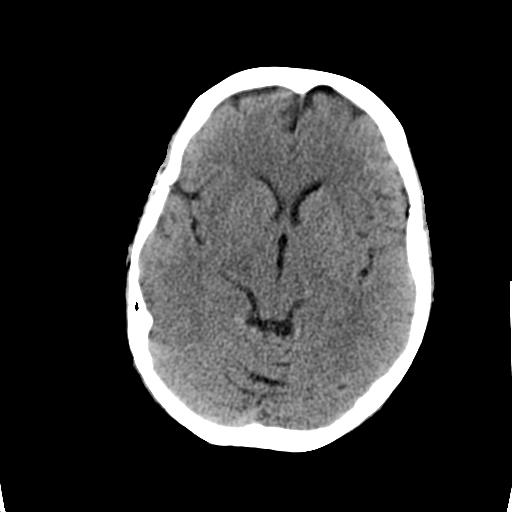
[im 13/30  brain]
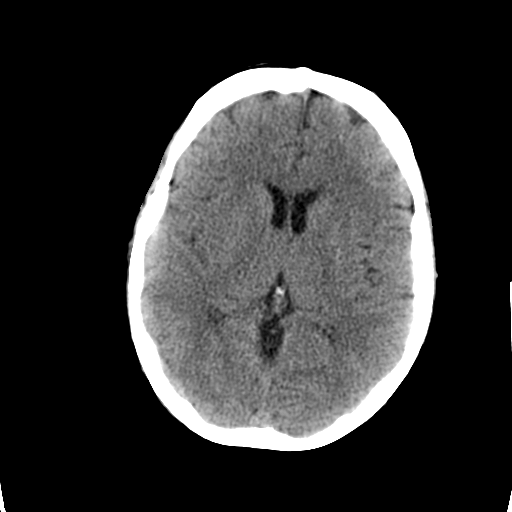
[im 15/30  brain]
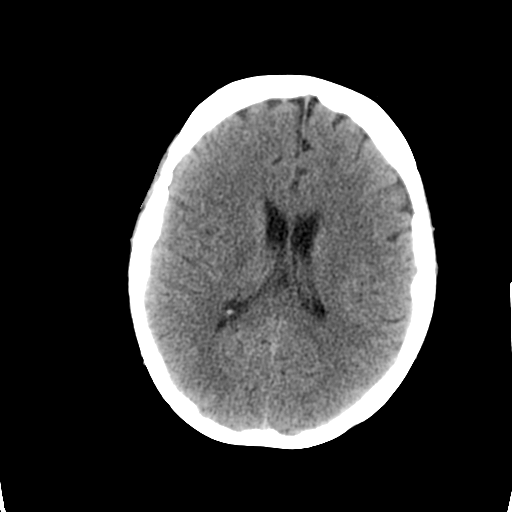
[im 16/30  brain]
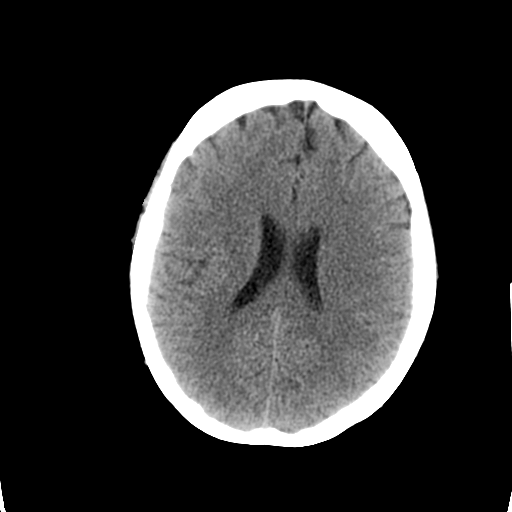
[im 16/30  bone]
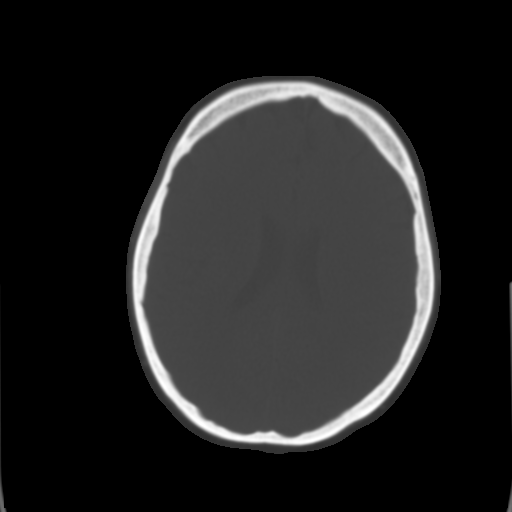
[im 18/30  brain]
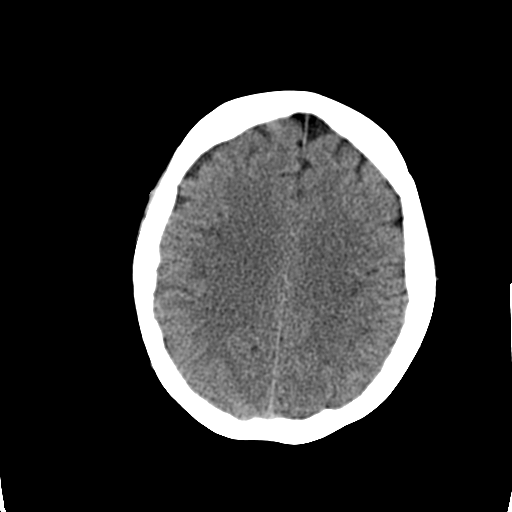
[im 20/30  brain]
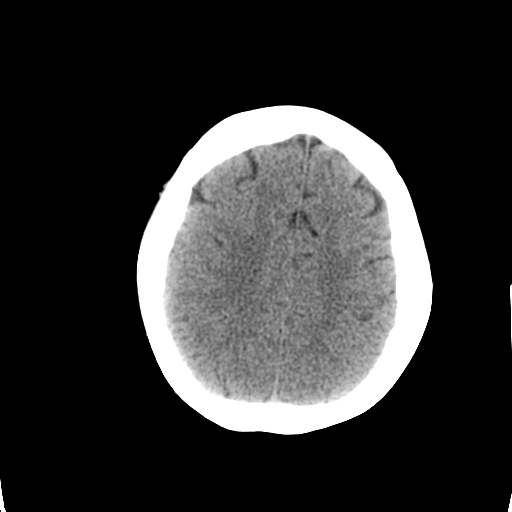
[im 22/30  brain]
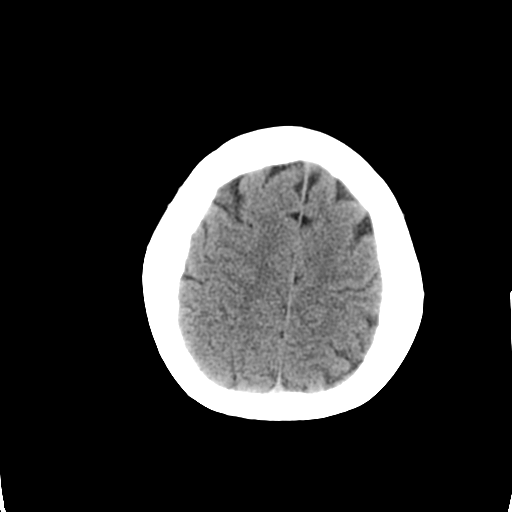
[im 23/30  brain]
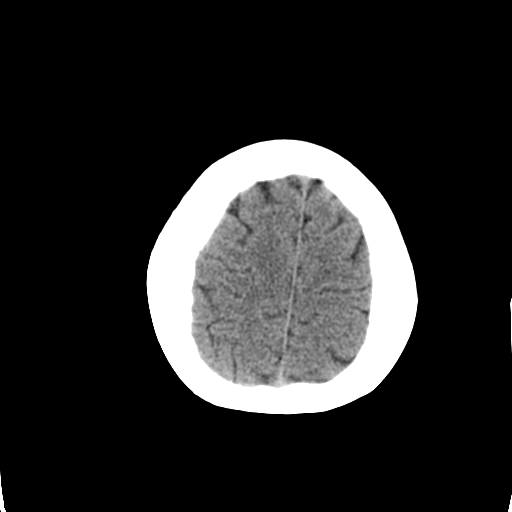
[im 23/30  bone]
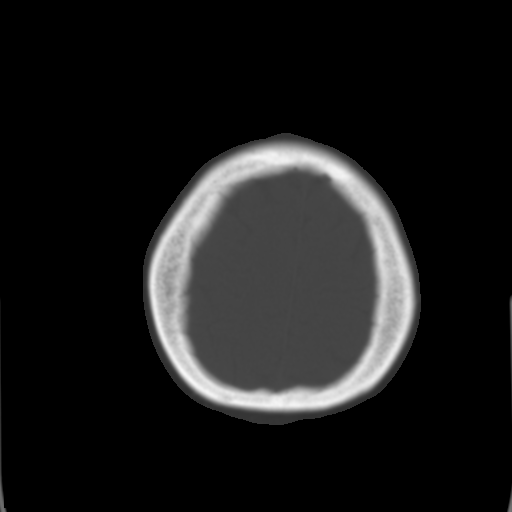
[im 25/30  brain]
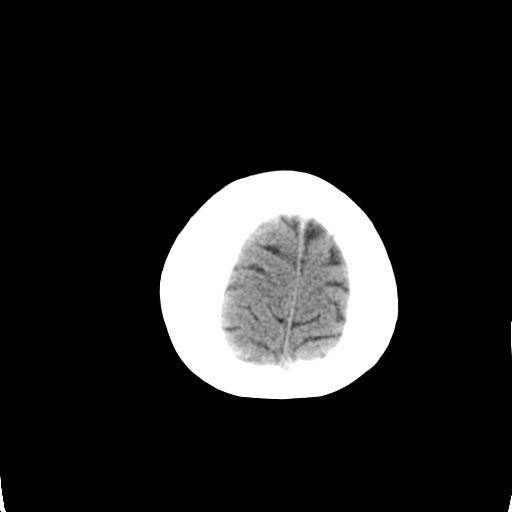
[im 27/30  brain]
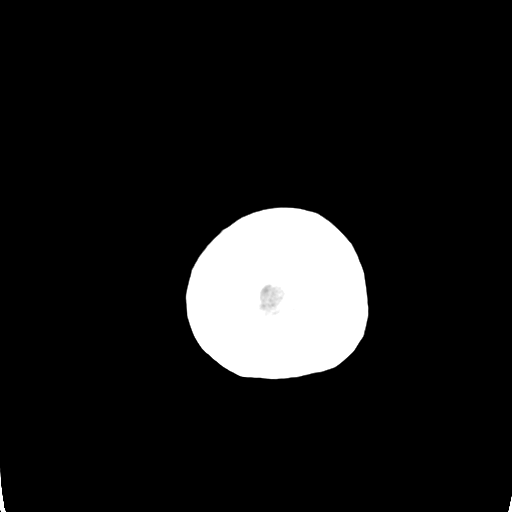
[im 29/30  brain]
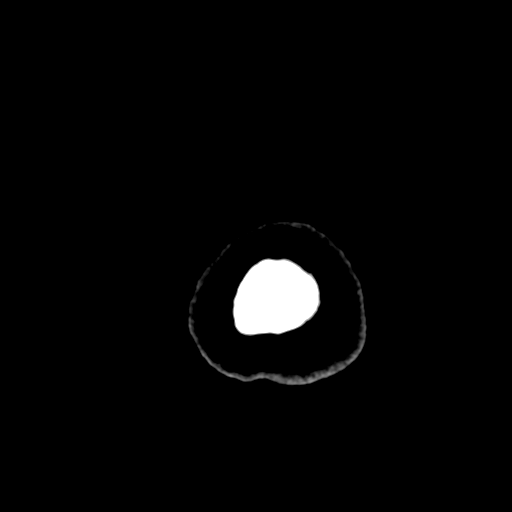

[16 of 30 positions shown; findings below may reference images not displayed]

FINDINGS: There is no evidence of acute intracranial abnormality including
infarct, hemorrhage, mass lesion, mass effect, midline shift or
abnormal extra-axial fluid collection. No hydrocephalus or
pneumocephalus. The calvarium is intact.
IMPRESSION: Negative head CT.

Critical Value/emergent results were called by telephone at the time
of interpretation on 03/18/2014 at [DATE] to Dr. BAKHE NOVUKA , who
verbally acknowledged these results.

## 2015-11-21 IMAGING — US US CAROTID DUPLEX BILAT
1 series · 13 of 24 positions shown · non-contrast
Comparison: CTA of the neck on 03/18/2014

CLINICAL DATA: Cerebral infarction and syncope. History of
hypertension, hyperlipidemia, diabetes and tobacco use.

EXAM:
BILATERAL CAROTID DUPLEX ULTRASOUND
TECHNIQUE: Gray scale imaging, color Doppler and duplex ultrasound were
performed of bilateral carotid and vertebral arteries in the neck.

[Series 1: us carotid duplex bilat · 0.06mm/px · 13 of 68 slices shown]
[im 1/68]
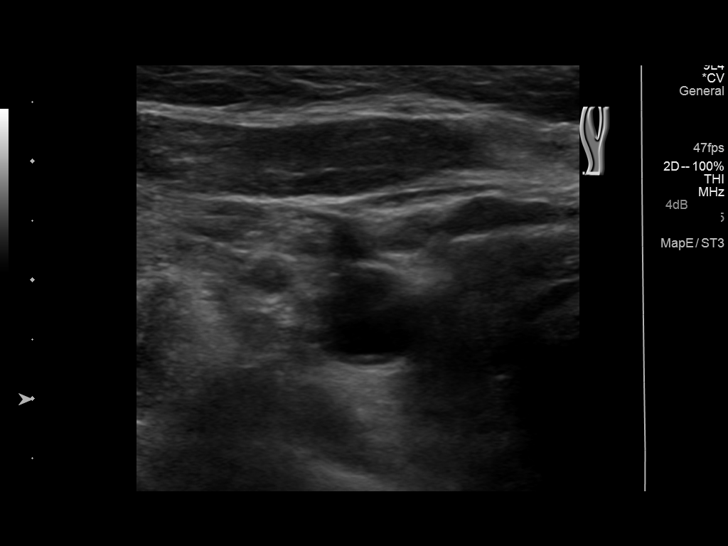
[im 6/68]
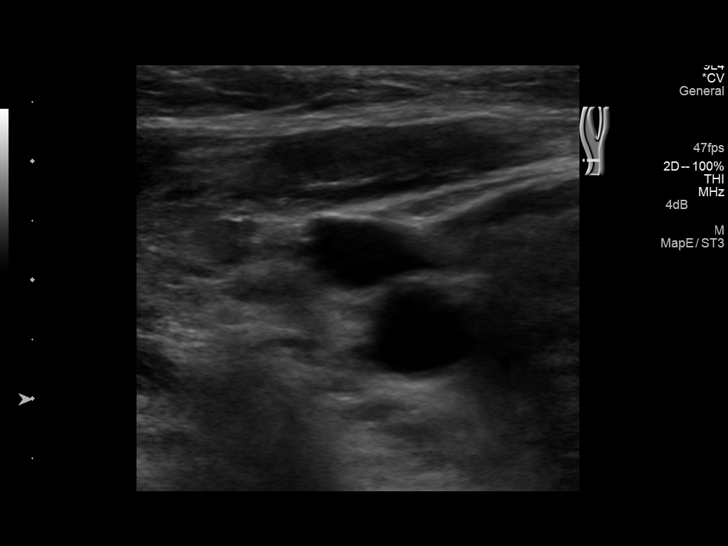
[im 12/68]
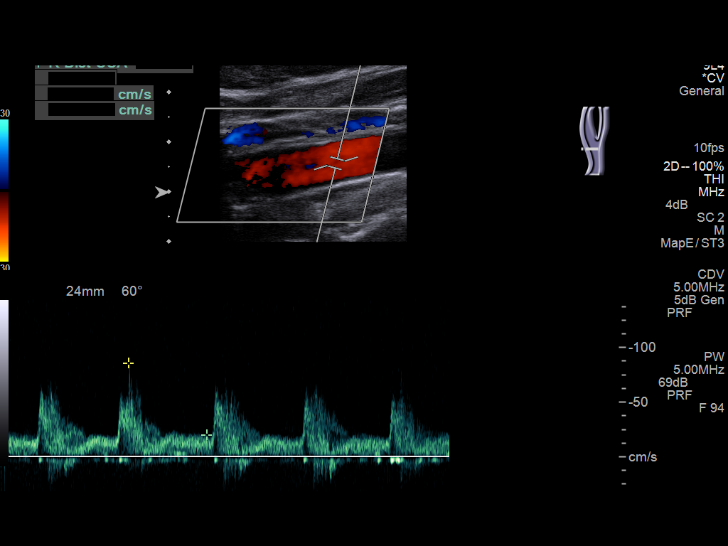
[im 18/68]
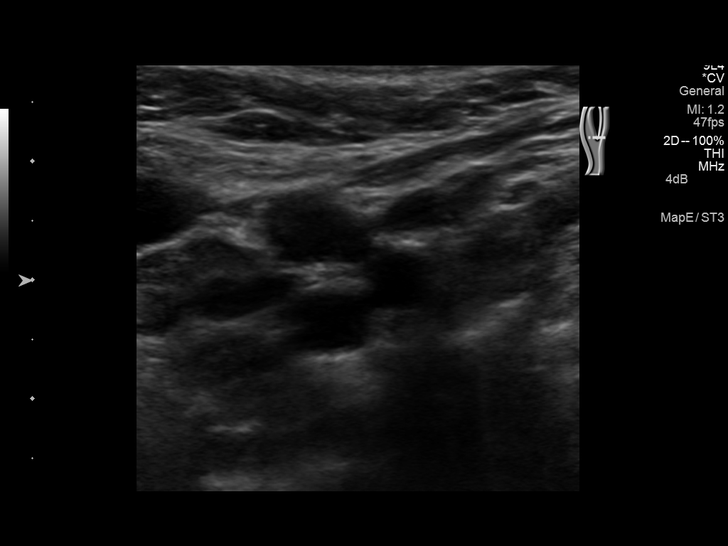
[im 24/68]
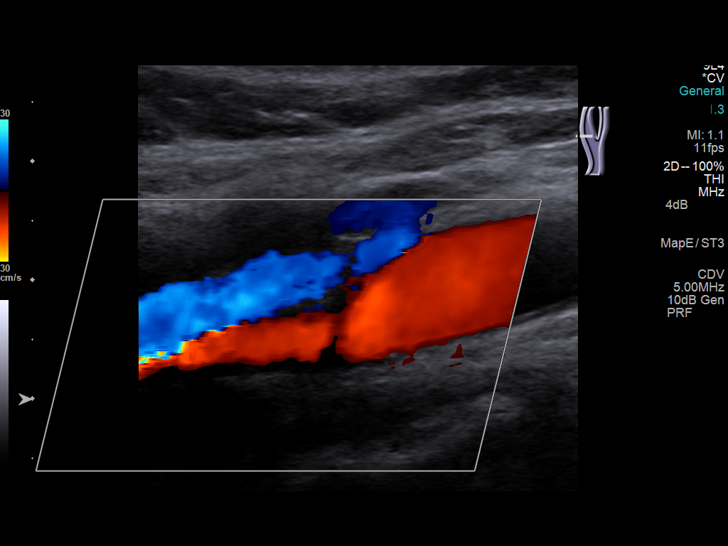
[im 30/68]
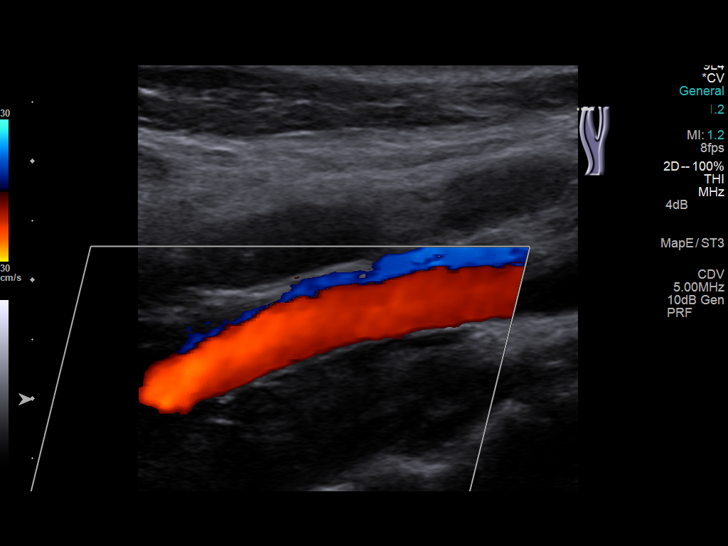
[im 35/68]
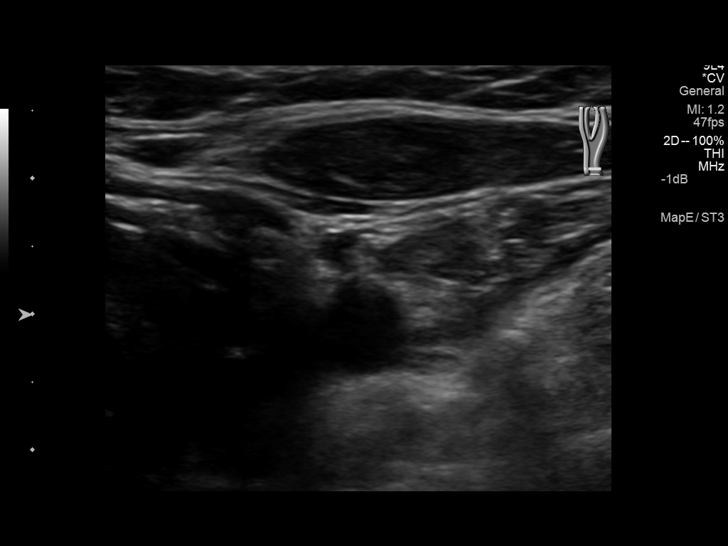
[im 38/68]
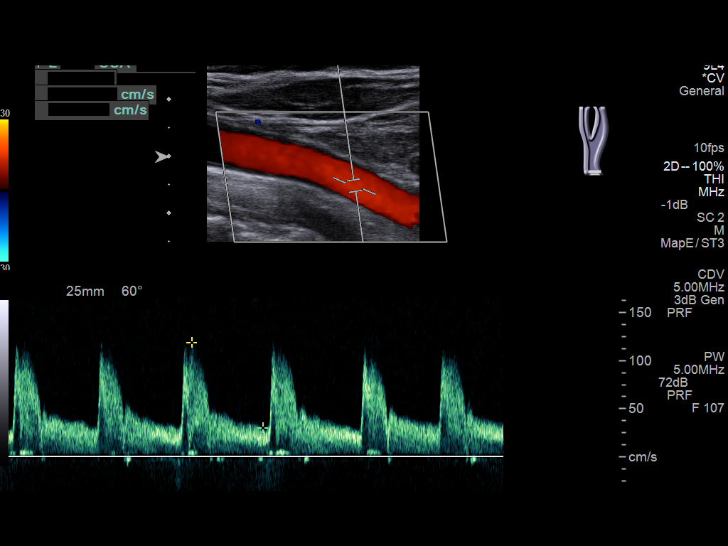
[im 44/68]
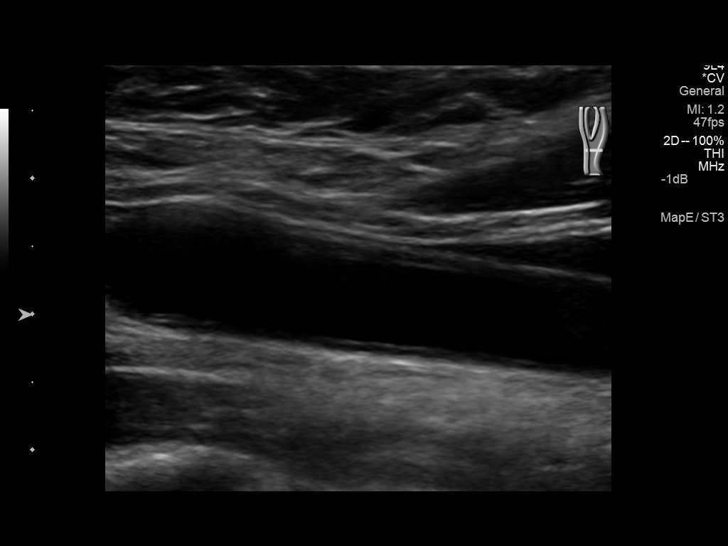
[im 50/68]
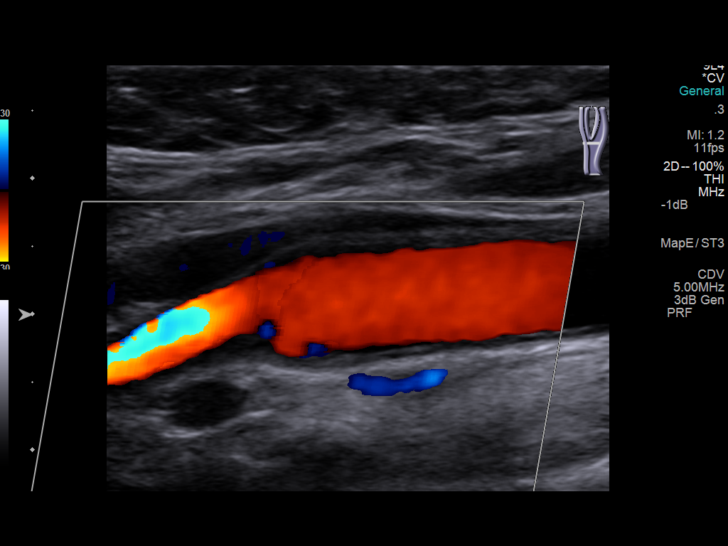
[im 56/68]
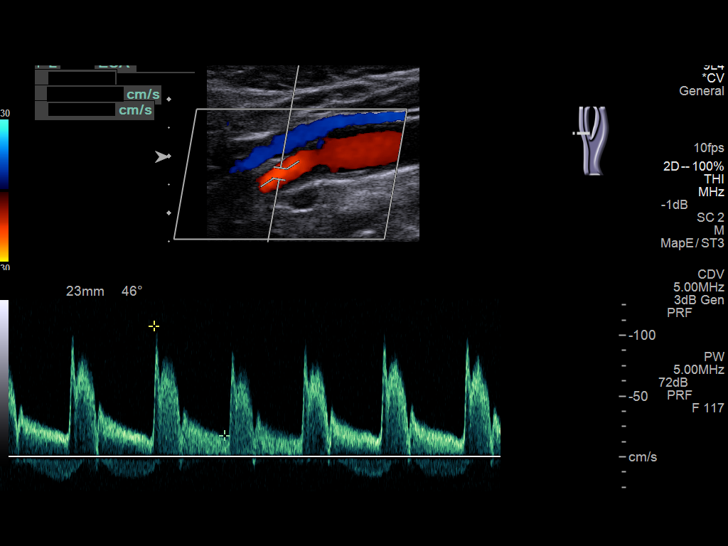
[im 62/68]
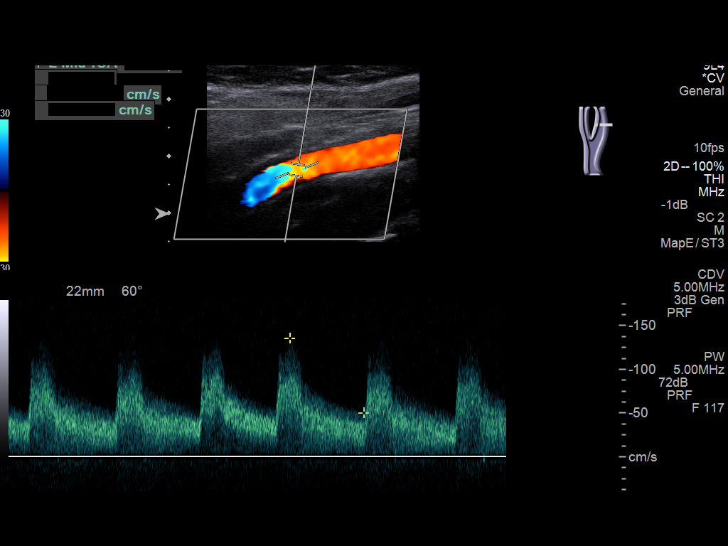
[im 68/68]
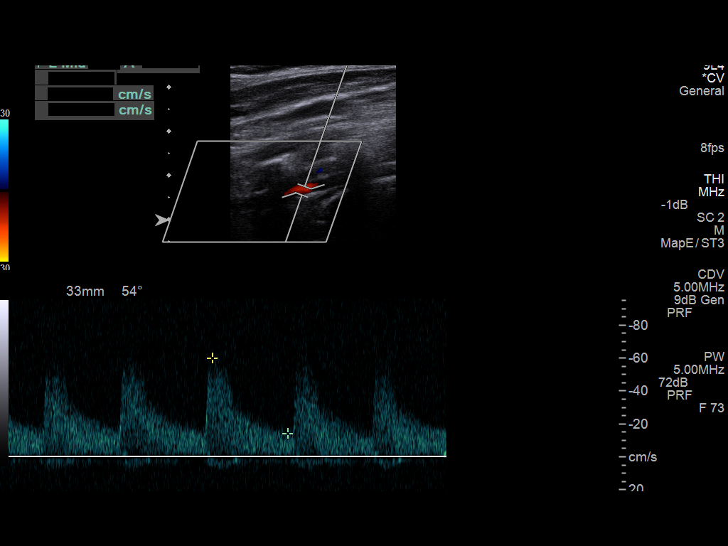

[13 of 24 positions shown; findings below may reference images not displayed]

FINDINGS: Criteria: Quantification of carotid stenosis is based on velocity
parameters that correlate the residual internal carotid diameter
with NASCET-based stenosis levels, using the diameter of the distal
internal carotid lumen as the denominator for stenosis measurement.

The following velocity measurements were obtained:

RIGHT

ICA:  103/39 cm/sec

CCA:  119/20 cm/sec

SYSTOLIC ICA/CCA RATIO:

DIASTOLIC ICA/CCA RATIO:

ECA:  101 cm/sec

LEFT

ICA:  136/50 mid and 86/31 proximal cm/sec

CCA:  98/26 cm/sec

SYSTOLIC ICA/CCA RATIO:

DIASTOLIC ICA/CCA RATIO:

ECA:  108 cm/sec

RIGHT CAROTID ARTERY: The common carotid artery shows intimal
thickening. There is a mild amount of partially calcified plaque at
the level of the carotid bulb and proximal ICA. Velocities and
waveforms are unremarkable and estimated right ICA stenosis is less
than 50%.

RIGHT VERTEBRAL ARTERY: Antegrade flow with normal waveform and
velocity.

LEFT CAROTID ARTERY: The common carotid artery shows intimal
thickening. There is a mild amount of partially calcified plaque at
the level of the distal bulb and proximal ICA. This is not causing
significant stenosis with estimated left ICA stenosis of less than
50%. Mildly elevated velocity in the mid neck corresponds to a
tortuous segment of the internal carotid artery which does not
contain plaque.

LEFT VERTEBRAL ARTERY: Antegrade flow with normal waveform and
velocity.
IMPRESSION: Mild amount of plaque at the level of both carotid bulbs and ICA
origins. No significant carotid stenosis identified with estimated
ICA stenoses of less than 50%. This correlates with CTA findings.

## 2015-11-21 IMAGING — MR MR MRA HEAD W/O CM
1 series · 15 of 48 positions shown · non-contrast
Comparison: CT angiogram head and neck 03/18/2014.

CLINICAL DATA: 53-year-old hypertensive diabetic female with
hyperlipidemia presenting with headache and numbness/ weakness left
extremity. Subsequent encounter.

EXAM:
MRI HEAD WITHOUT CONTRAST
MRA HEAD WITHOUT CONTRAST
TECHNIQUE: Multiplanar, multiecho pulse sequences of the brain and surrounding
structures were obtained without intravenous contrast. Angiographic
images of the head were obtained using MRA technique without
contrast.

[Series 1: MRA · axial · 0.8mm · 0.38mm/px · z∈[-98,+0]mm · 15 of 131 slices shown]
[im 1/131]
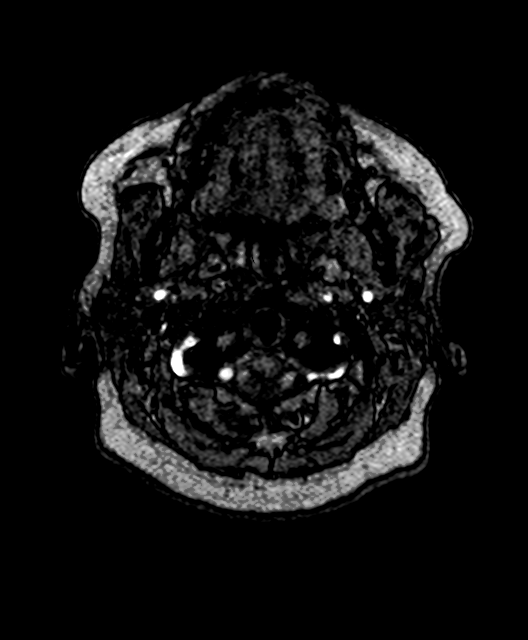
[im 3/131]
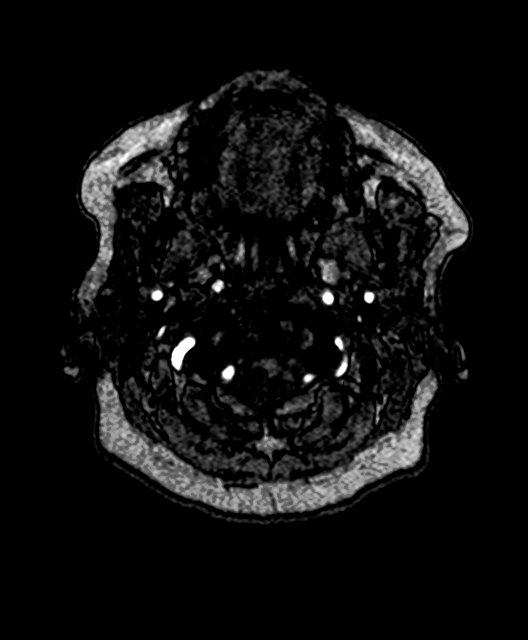
[im 6/131]
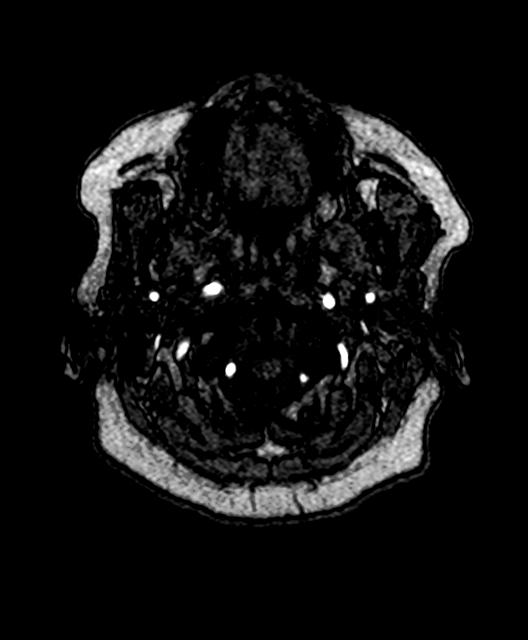
[im 9/131]
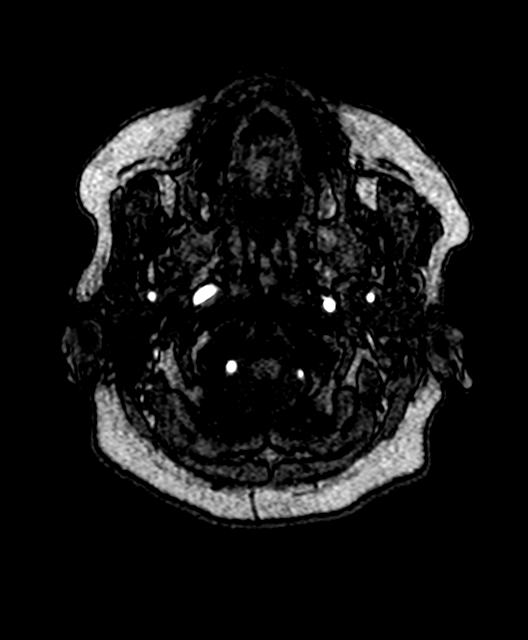
[im 12/131]
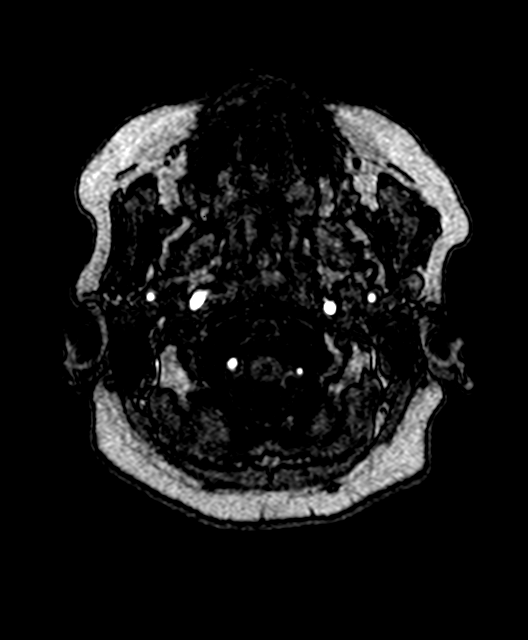
[im 23/131]
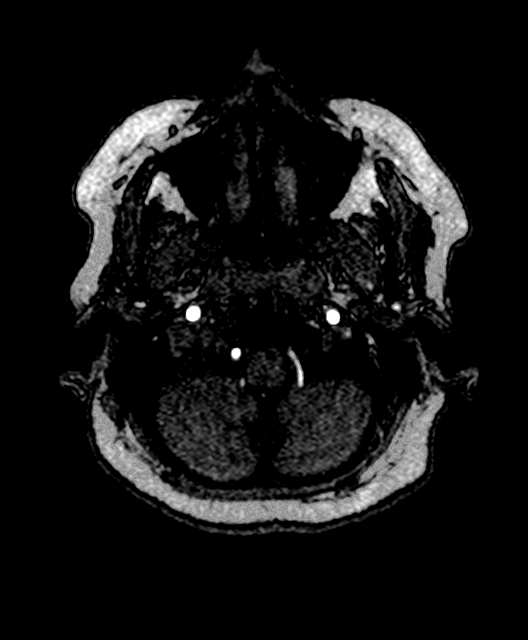
[im 25/131]
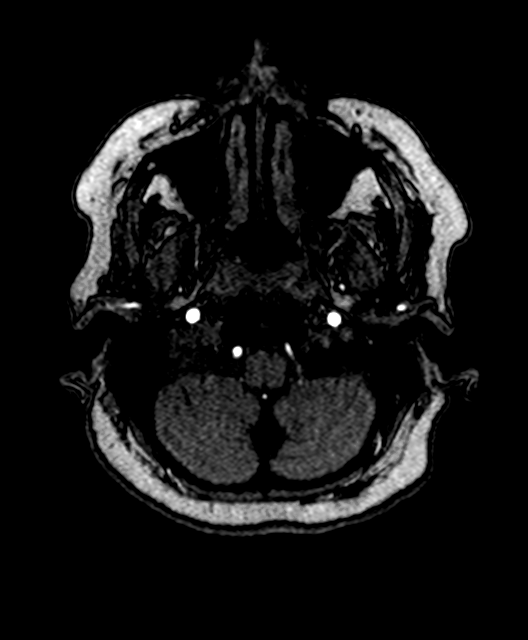
[im 42/131]
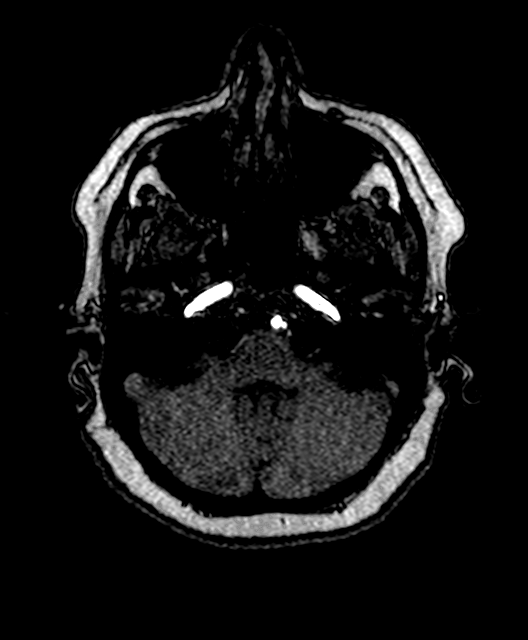
[im 59/131]
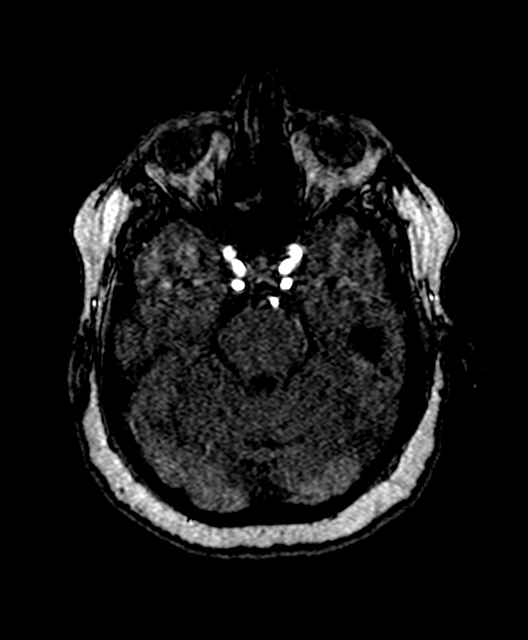
[im 67/131]
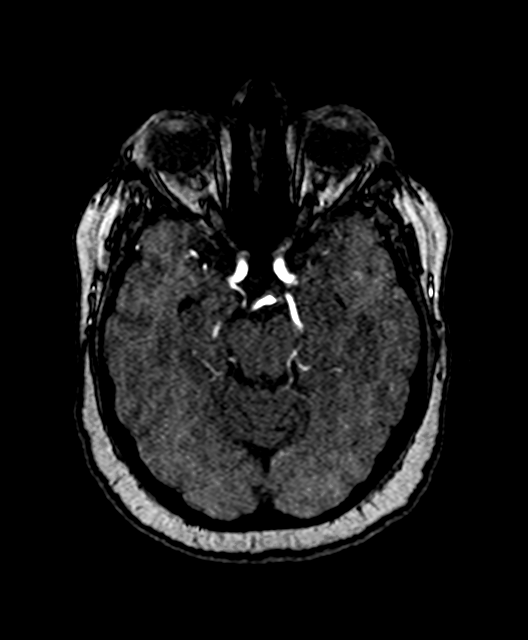
[im 75/131]
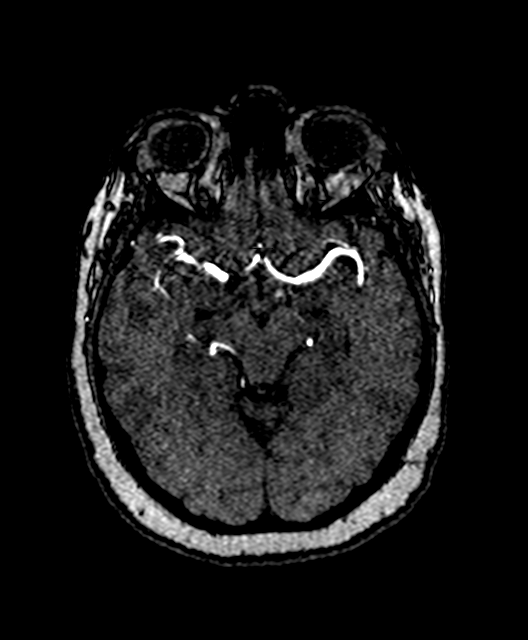
[im 92/131]
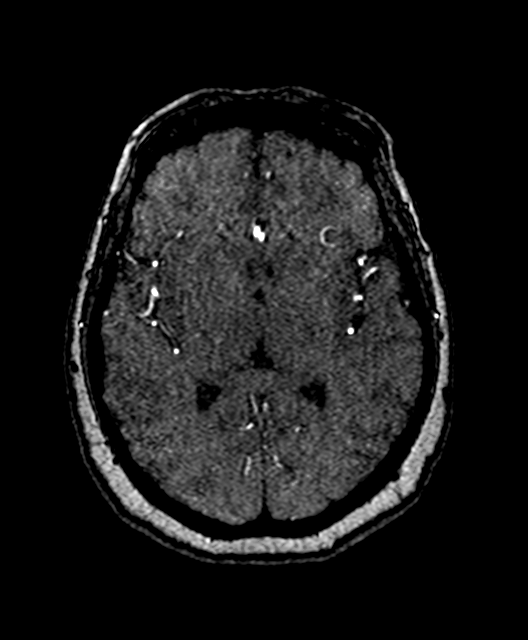
[im 108/131]
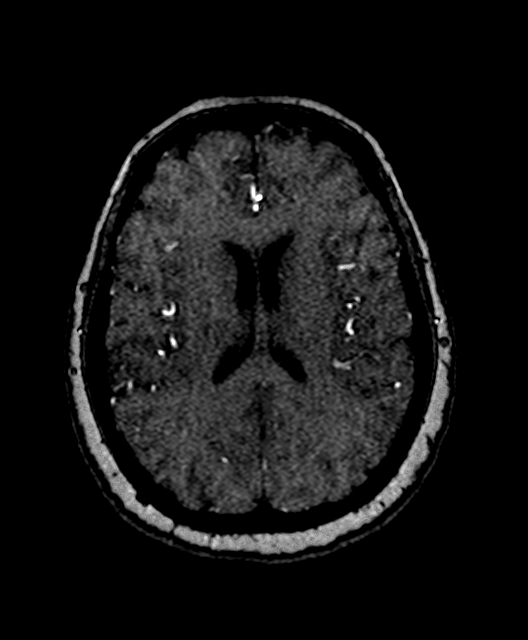
[im 111/131]
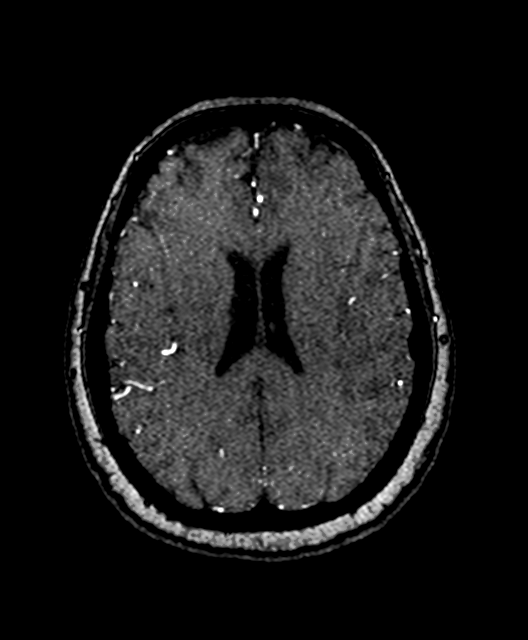
[im 125/131]
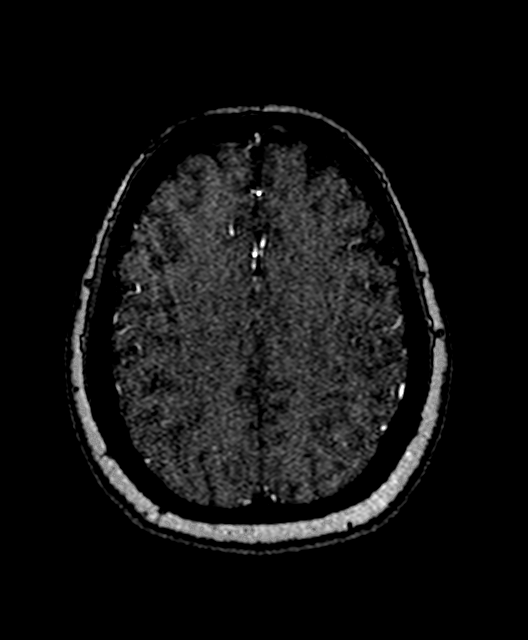

[15 of 48 positions shown; findings below may reference images not displayed]

FINDINGS: MRI HEAD FINDINGS

No acute infarct.

No intracranial hemorrhage. The questionable increased signal within
sulci in the occipital region on series 6 is not confirmed as a true
abnormality on other sequences and therefore may be related to
motion artifact rather than subarachnoid hemorrhage or meningitis.

Moderate punctate and patchy nonspecific white matter type changes.
Given the patient's history of smoking, diabetes and high blood
pressure, findings are most likely related to result of small vessel
disease rather than demyelinating process, vasculitis or
inflammatory process.

No hydrocephalus.

No intracranial mass lesion noted on this unenhanced exam.

Cervical medullary junction, pituitary region, pineal region and
orbital structures unremarkable.

Major intracranial vascular structures are patent.

Minimal polypoid opacification medial aspect left maxillary sinus.
Opacification posterior right ethmoid sinus air cell.

Tiny right-sided Thornwaldt cyst.

MRA HEAD FINDINGS

Artifact extends through the petrous, cavernous and pre cavernous
segment of the internal carotid artery bilaterally more notable on
left. No significant stenosis is seen in this region on the recent
CT angiogram.

Otherwise anterior circulation without medium or large size vessel
significant stenosis or occlusion.

Middle cerebral artery branch vessel irregularity bilaterally.

Right vertebral artery is dominant. Mild ectasia distal right
vertebral artery. Mild narrowing distal left vertebral artery.

No significant stenosis of the basilar artery.

Nonvisualized right anterior inferior cerebellar artery.

Mild narrowing and irregularity of the superior cerebellar artery
bilaterally.

Posterior cerebral artery distal branch vessel irregularity
bilaterally.

No aneurysm detected.
IMPRESSION: MRI HEAD:

No acute infarct.

Moderate punctate and patchy nonspecific white matter type changes.
Given the patient's history of smoking, diabetes and high blood
pressure, findings are most likely related to result of small vessel
disease rather than demyelinating process, vasculitis or
inflammatory process.

No intracranial hemorrhage. The questionable increased signal within
sulci in the occipital region on series 6 is not confirmed as a true
abnormality on other sequences and therefore may be related to
motion artifact rather than subarachnoid hemorrhage or meningitis.

MRA HEAD:

Motion artifact as noted above.

Mild branch vessel irregularity.

## 2015-11-21 IMAGING — MR MR HEAD W/O CM
6 of 12 series · 21 of 48 positions shown · non-contrast
Comparison: CT angiogram head and neck 03/18/2014.

CLINICAL DATA: 53-year-old hypertensive diabetic female with
hyperlipidemia presenting with headache and numbness/ weakness left
extremity. Subsequent encounter.

EXAM:
MRI HEAD WITHOUT CONTRAST
MRA HEAD WITHOUT CONTRAST
TECHNIQUE: Multiplanar, multiecho pulse sequences of the brain and surrounding
structures were obtained without intravenous contrast. Angiographic
images of the head were obtained using MRA technique without
contrast.

[Series 2: T1 · sagittal · 5.0mm · 0.42mm/px · 2 of 21 slices shown (1 of 2)]
[im 1/21]
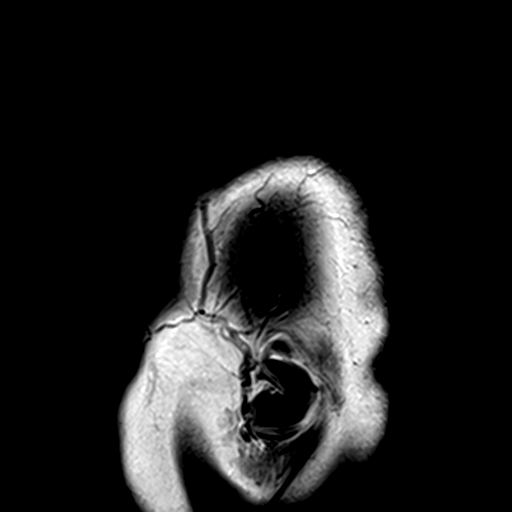
[im 21/21]
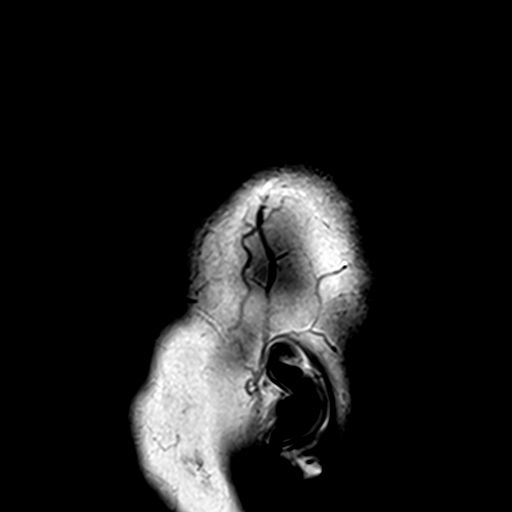

[Series 5: T2 · axial · 5.0mm · 0.51mm/px · z∈[-101,+41]mm · 3 of 23 slices shown (1 of 2)]
[im 1/23]
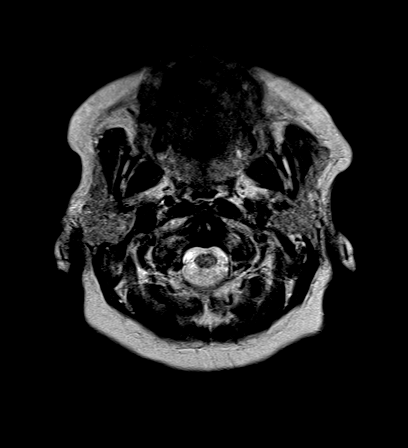
[im 12/23]
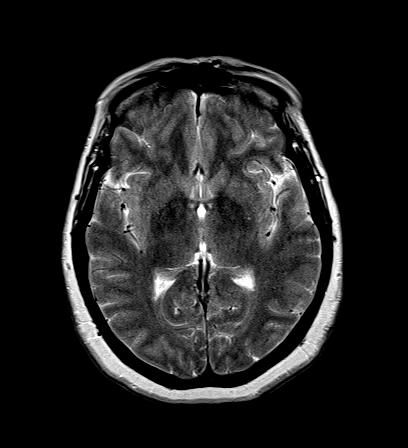
[im 23/23]
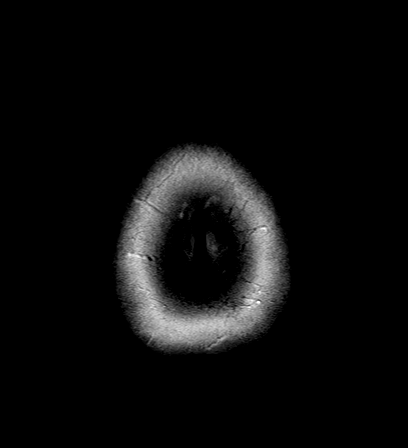

[Series 6: FLAIR · axial · 5.0mm · 0.38mm/px · z∈[-102,+40]mm · 3 of 23 slices shown]
[im 1/23]
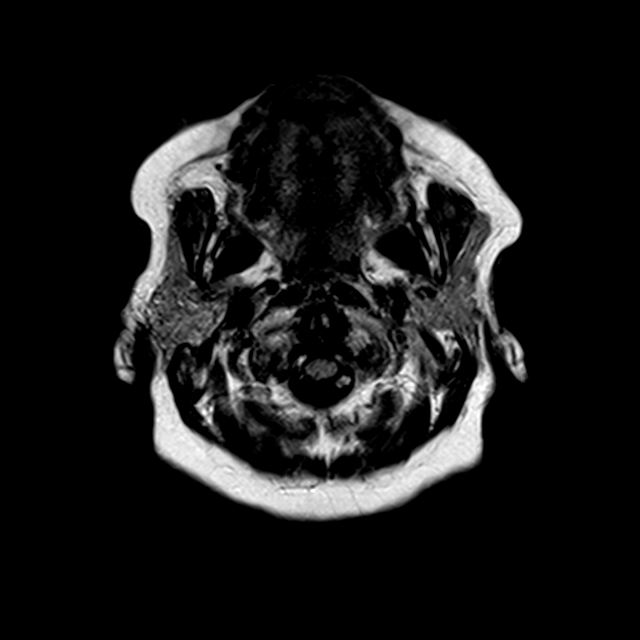
[im 12/23]
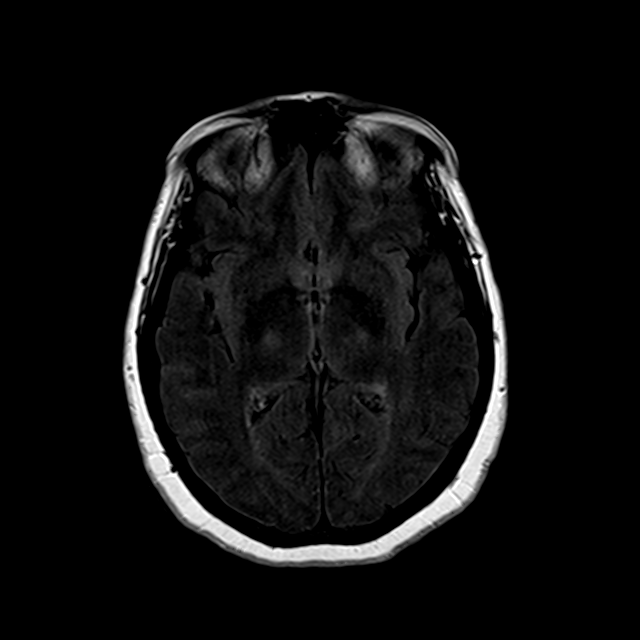
[im 23/23]
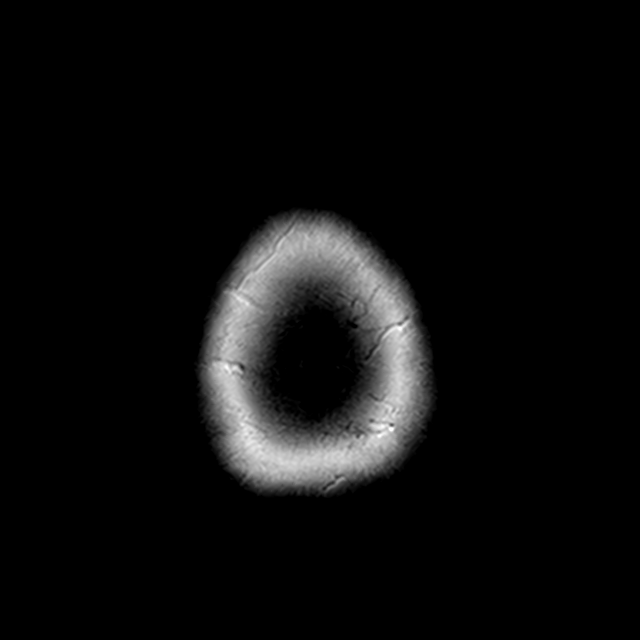

[Series 7: T1 · axial · 2.0mm · 0.45mm/px · z∈[-114,+72]mm · 8 of 95 slices shown (2 of 2)]
[im 1/95]
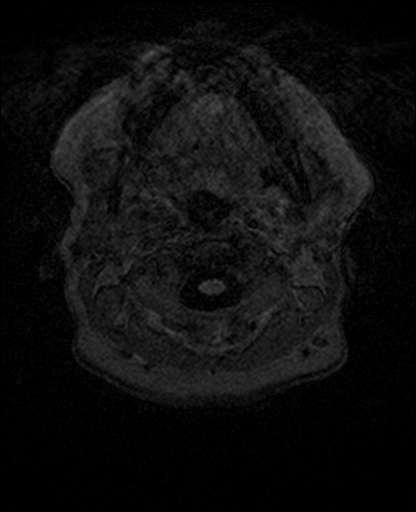
[im 19/95]
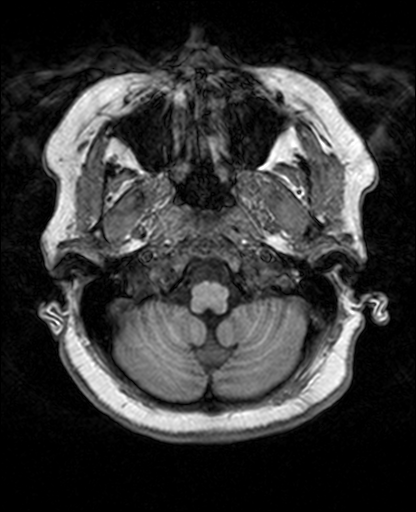
[im 29/95]
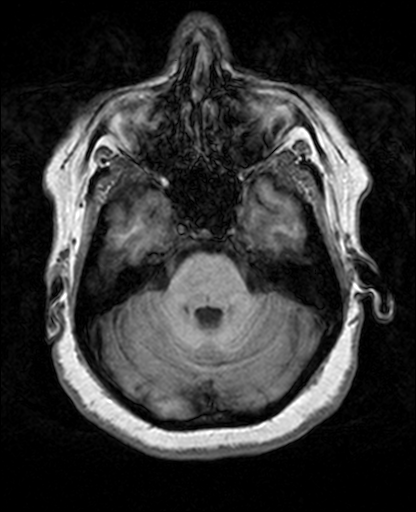
[im 38/95]
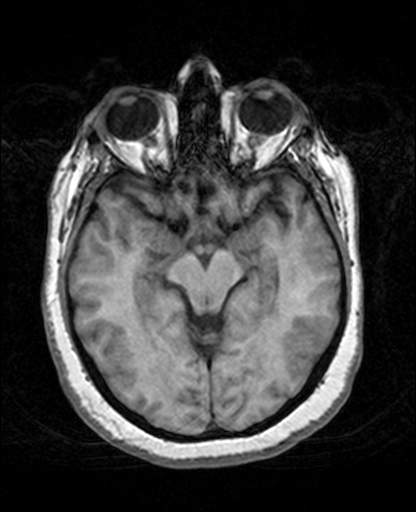
[im 57/95]
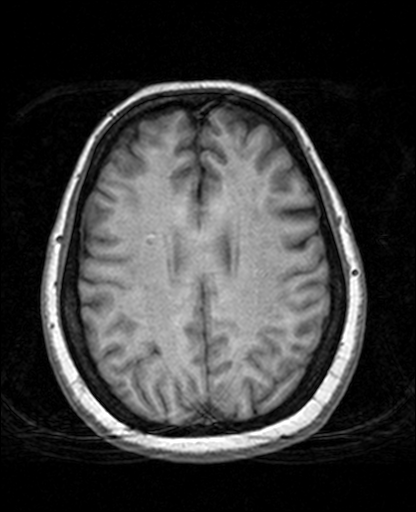
[im 66/95]
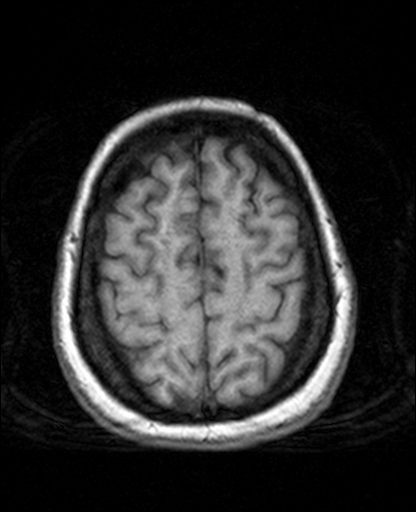
[im 76/95]
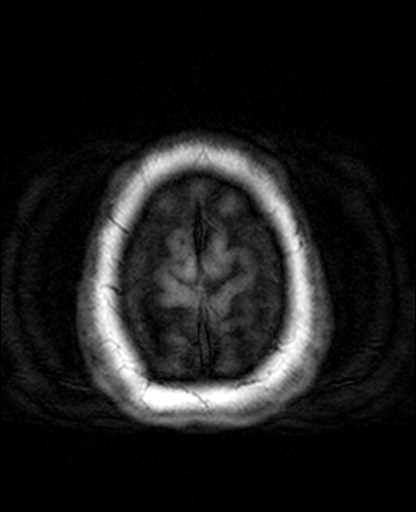
[im 95/95]
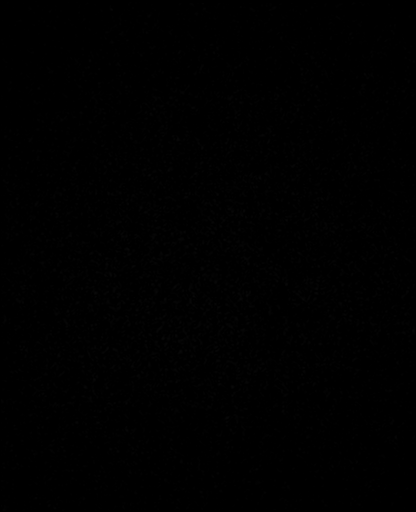

[Series 8: trauma axial · axial · 5.0mm · 0.45mm/px · z∈[-101,-30]mm · 2 of 23 slices shown]
[im 1/23]
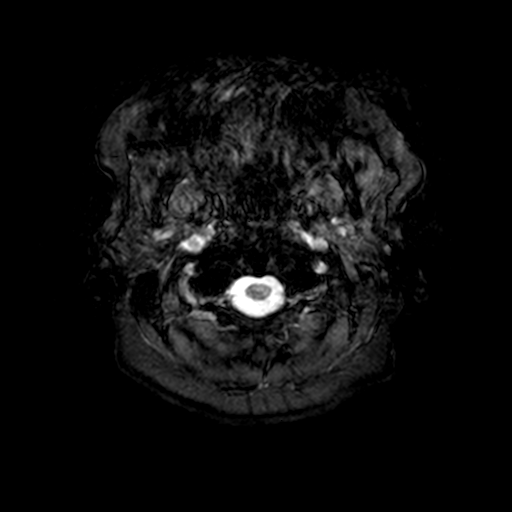
[im 12/23]
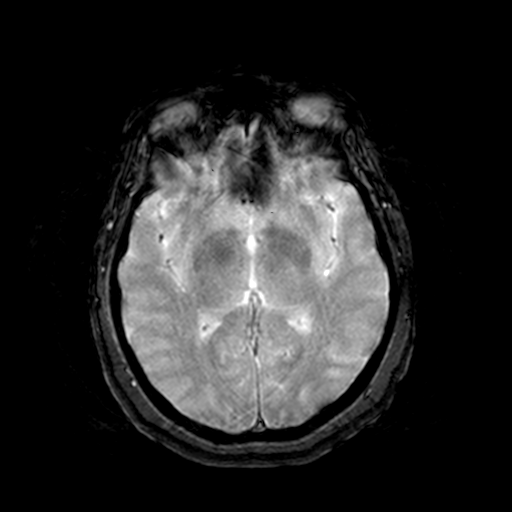

[Series 9: T2 · coronal · 5.0mm · 0.58mm/px · 3 of 28 slices shown (2 of 2)]
[im 1/28]
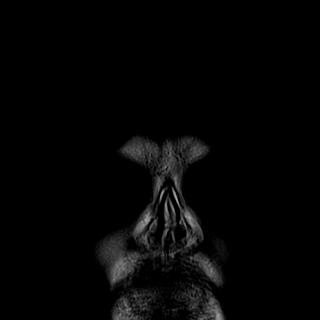
[im 14/28]
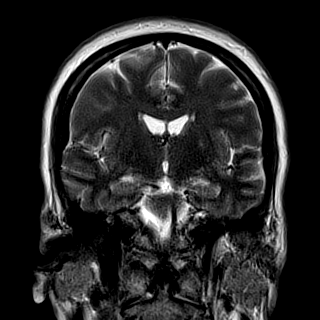
[im 28/28]
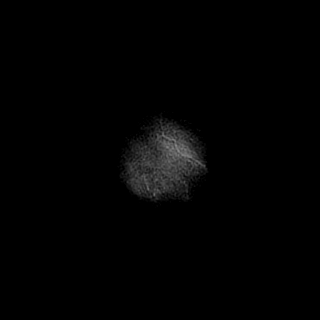

[21 of 48 positions shown; findings below may reference images not displayed]

FINDINGS: MRI HEAD FINDINGS

No acute infarct.

No intracranial hemorrhage. The questionable increased signal within
sulci in the occipital region on series 6 is not confirmed as a true
abnormality on other sequences and therefore may be related to
motion artifact rather than subarachnoid hemorrhage or meningitis.

Moderate punctate and patchy nonspecific white matter type changes.
Given the patient's history of smoking, diabetes and high blood
pressure, findings are most likely related to result of small vessel
disease rather than demyelinating process, vasculitis or
inflammatory process.

No hydrocephalus.

No intracranial mass lesion noted on this unenhanced exam.

Cervical medullary junction, pituitary region, pineal region and
orbital structures unremarkable.

Major intracranial vascular structures are patent.

Minimal polypoid opacification medial aspect left maxillary sinus.
Opacification posterior right ethmoid sinus air cell.

Tiny right-sided Thornwaldt cyst.

MRA HEAD FINDINGS

Artifact extends through the petrous, cavernous and pre cavernous
segment of the internal carotid artery bilaterally more notable on
left. No significant stenosis is seen in this region on the recent
CT angiogram.

Otherwise anterior circulation without medium or large size vessel
significant stenosis or occlusion.

Middle cerebral artery branch vessel irregularity bilaterally.

Right vertebral artery is dominant. Mild ectasia distal right
vertebral artery. Mild narrowing distal left vertebral artery.

No significant stenosis of the basilar artery.

Nonvisualized right anterior inferior cerebellar artery.

Mild narrowing and irregularity of the superior cerebellar artery
bilaterally.

Posterior cerebral artery distal branch vessel irregularity
bilaterally.

No aneurysm detected.
IMPRESSION: MRI HEAD:

No acute infarct.

Moderate punctate and patchy nonspecific white matter type changes.
Given the patient's history of smoking, diabetes and high blood
pressure, findings are most likely related to result of small vessel
disease rather than demyelinating process, vasculitis or
inflammatory process.

No intracranial hemorrhage. The questionable increased signal within
sulci in the occipital region on series 6 is not confirmed as a true
abnormality on other sequences and therefore may be related to
motion artifact rather than subarachnoid hemorrhage or meningitis.

MRA HEAD:

Motion artifact as noted above.

Mild branch vessel irregularity.

## 2015-11-21 IMAGING — CR DG CHEST 2V
2 series · 2 of 2 positions shown · non-contrast
Comparison: 09/22/2013; 04/02/2012

CLINICAL DATA: Intermittent chest pain for 1 day. History of
smoking, CAD and COPD. Initial encounter.

EXAM:
CHEST  2 VIEW

[view not recorded (1 of 2)]
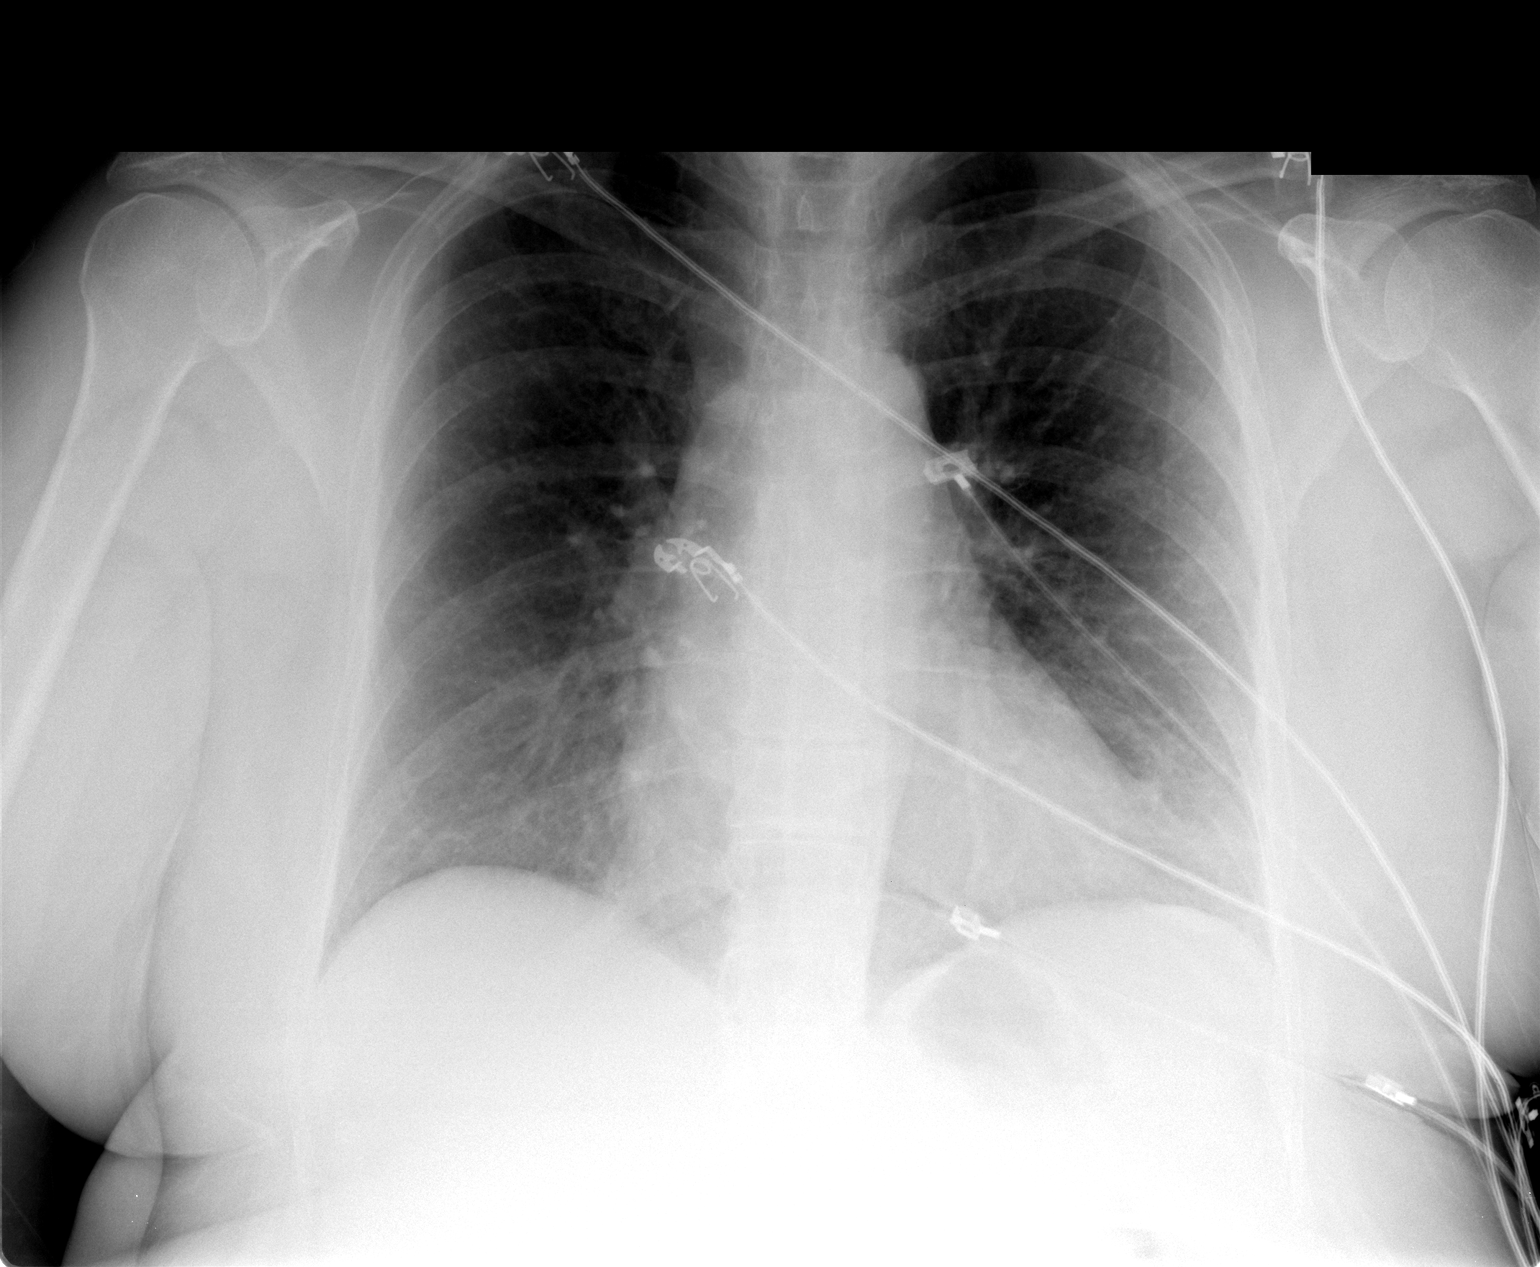

[view not recorded (2 of 2)]
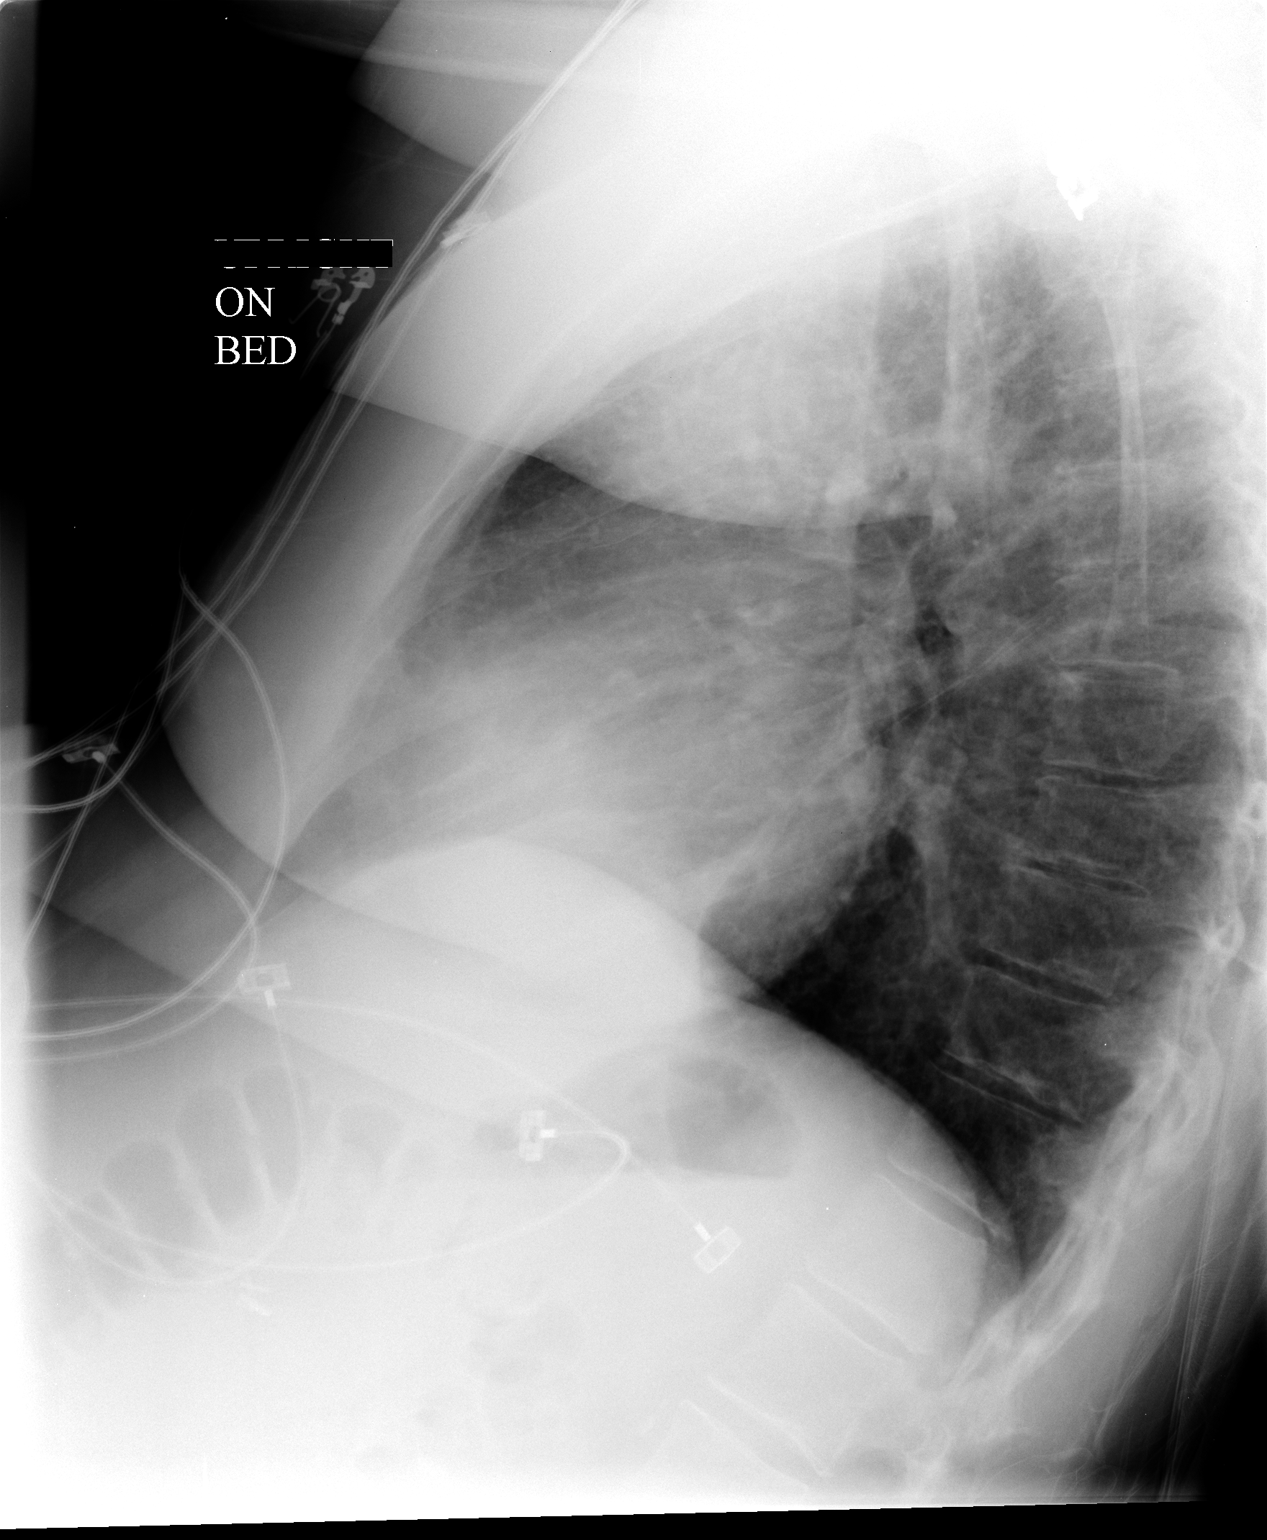

[2 of 2 positions shown; findings below may reference images not displayed]

FINDINGS: Grossly unchanged cardiac silhouette and mediastinal contours.
Evaluation retrosternal clear space obscured secondary to overlying
soft tissues. The lungs remain hyperexpanded. Grossly unchanged
bibasilar heterogeneous opacities, left greater than right, likely
atelectasis. No new focal airspace opacities. No pleural effusion or
pneumothorax. No evidence of edema. No acute osseus abnormalities.
IMPRESSION: Hyperexpanded lungs and bibasilar atelectasis without definite acute
cardiopulmonary disease.

## 2015-11-22 IMAGING — MR MR CERVICAL SPINE W/O CM
4 of 6 series · 13 of 48 positions shown · non-contrast
Comparison: None.

CLINICAL DATA: Acute onset of left-sided facial droop and
left-sided weakness and numbness. Brain MRI showed possible MS.
evaluate cervical spinal cord.

EXAM:
MRI CERVICAL SPINE WITHOUT CONTRAST
TECHNIQUE: Multiplanar, multisequence MR imaging of the cervical spine was
performed. No intravenous contrast was administered.

[Series 4: FLAIR · sagittal · 3.0mm · 0.43mm/px · 3 of 13 slices shown]
[im 1/13]
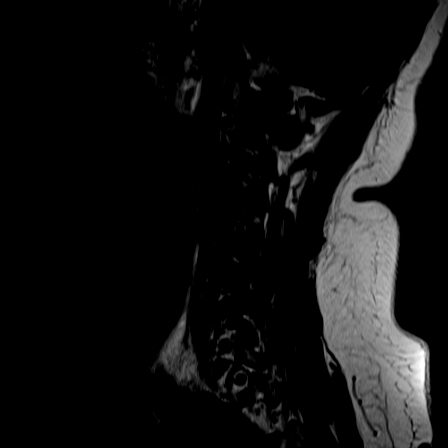
[im 9/13]
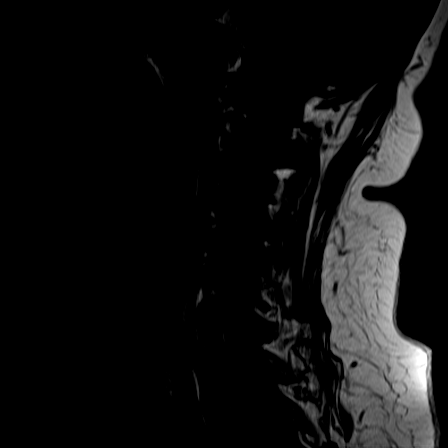
[im 13/13]
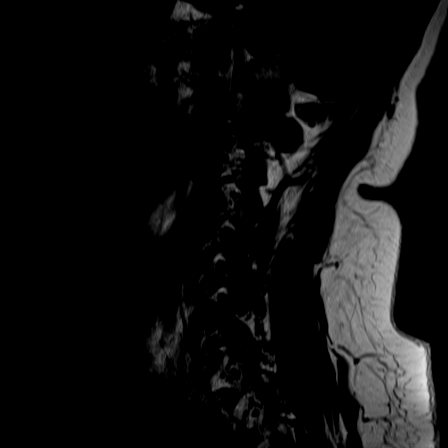

[Series 5: ir sagital · sagittal · 3.0mm · 0.22mm/px · 3 of 13 slices shown]
[im 1/13]
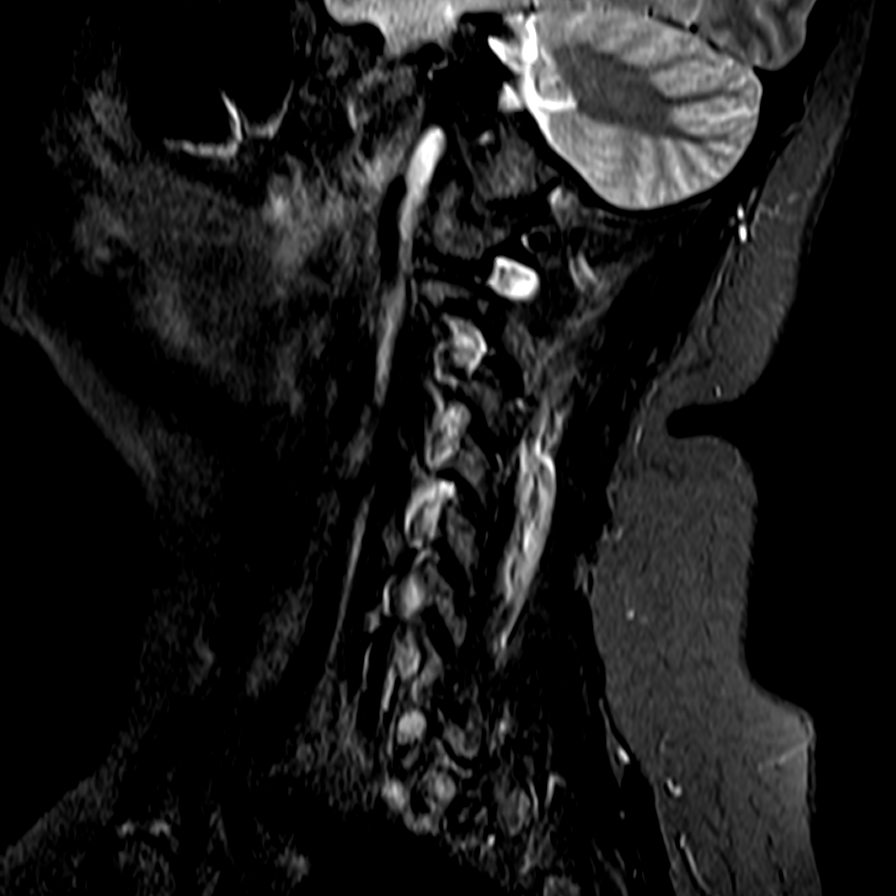
[im 9/13]
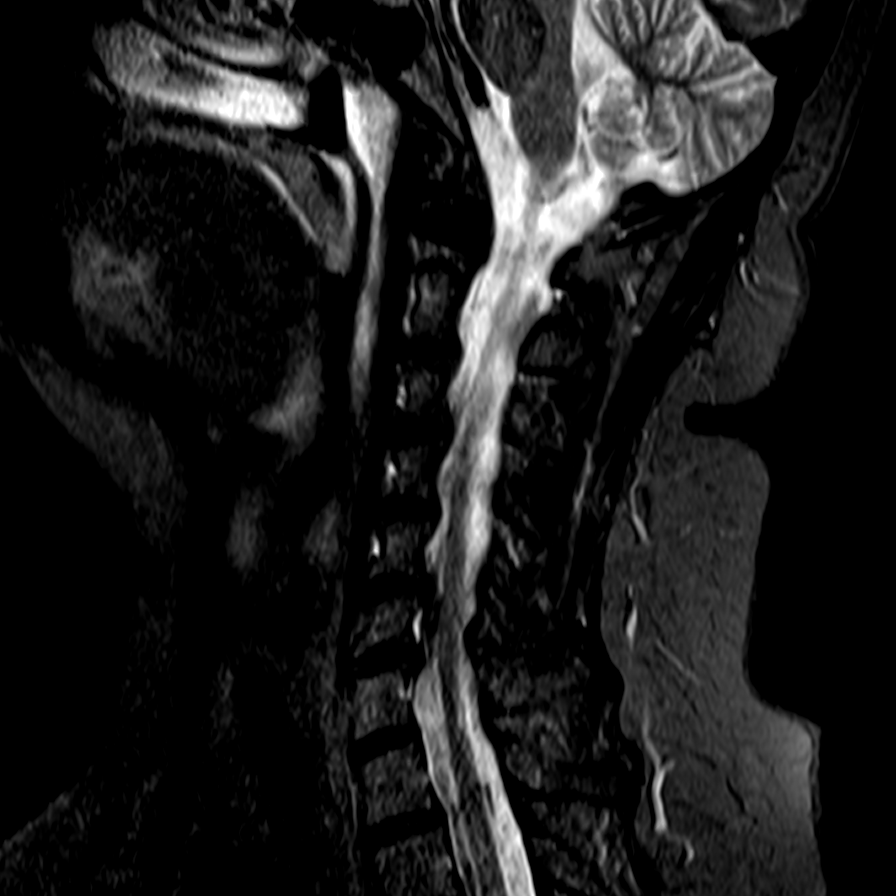
[im 13/13]
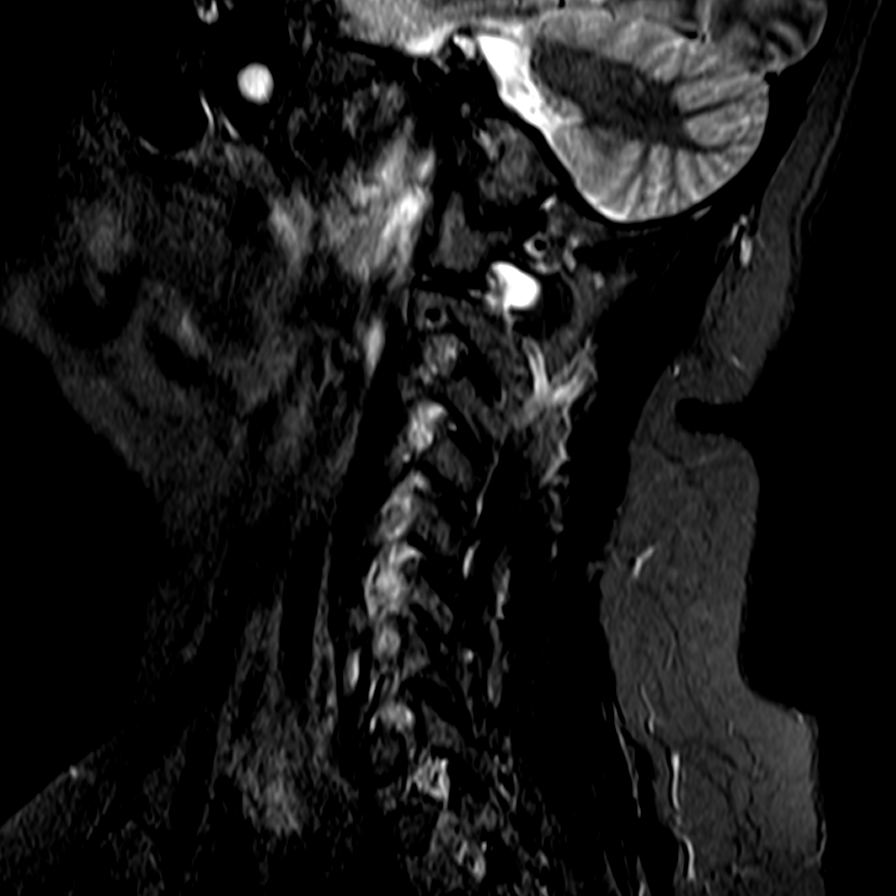

[Series 7: T2 · axial · 3.0mm · 0.23mm/px · z∈[-102,-9]mm · 4 of 36 slices shown (1 of 2)]
[im 1/36]
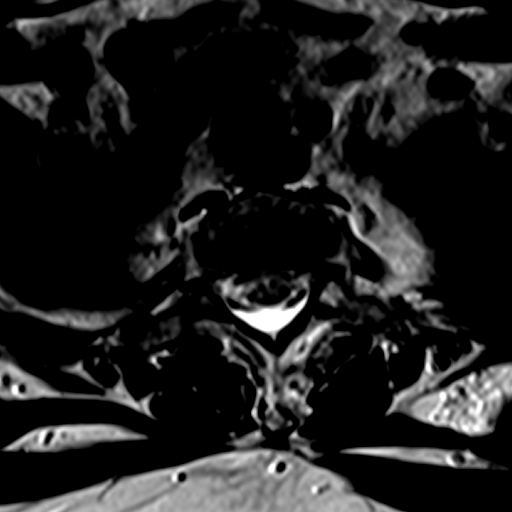
[im 6/36]
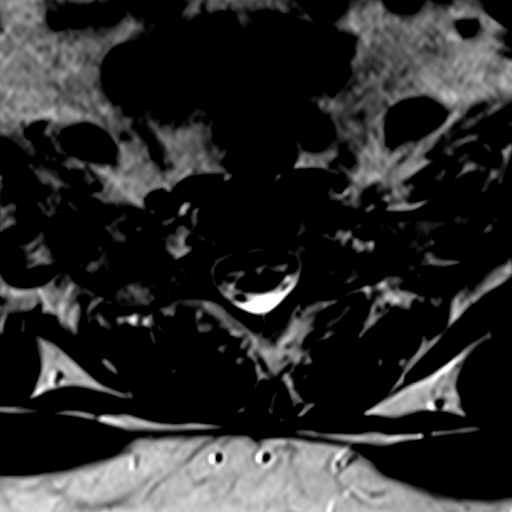
[im 19/36]
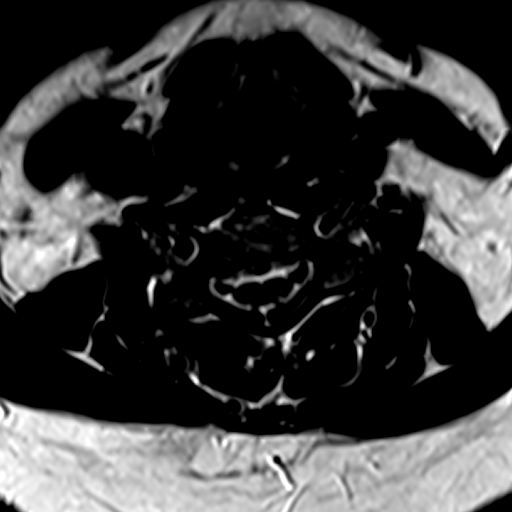
[im 30/36]
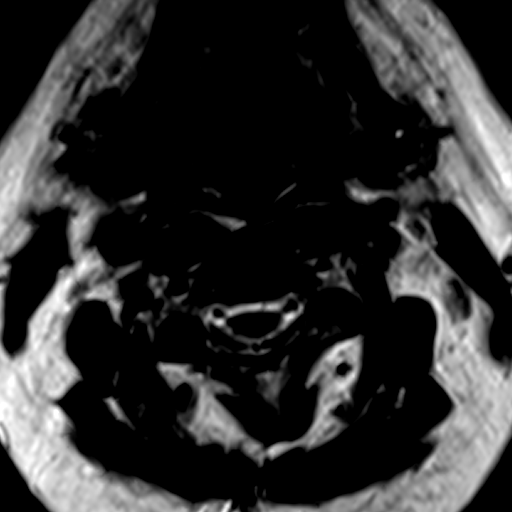

[Series 8: T2 · sagittal · 3.0mm · 0.30mm/px · 3 of 15 slices shown (2 of 2)]
[im 3/15]
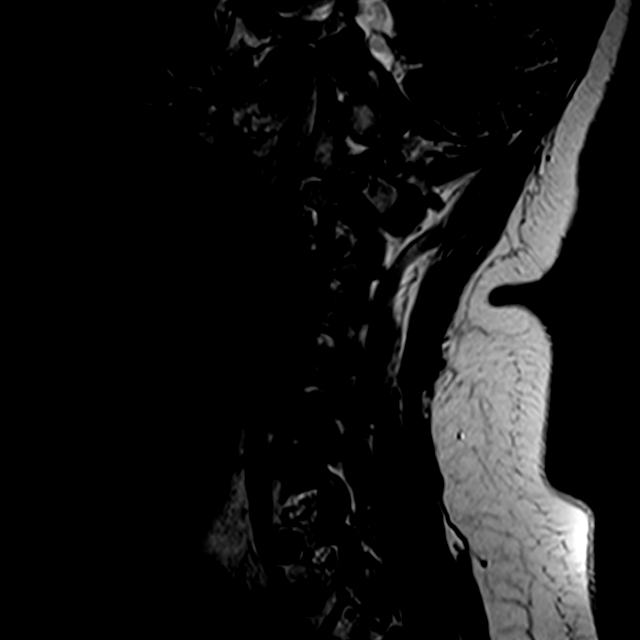
[im 9/15]
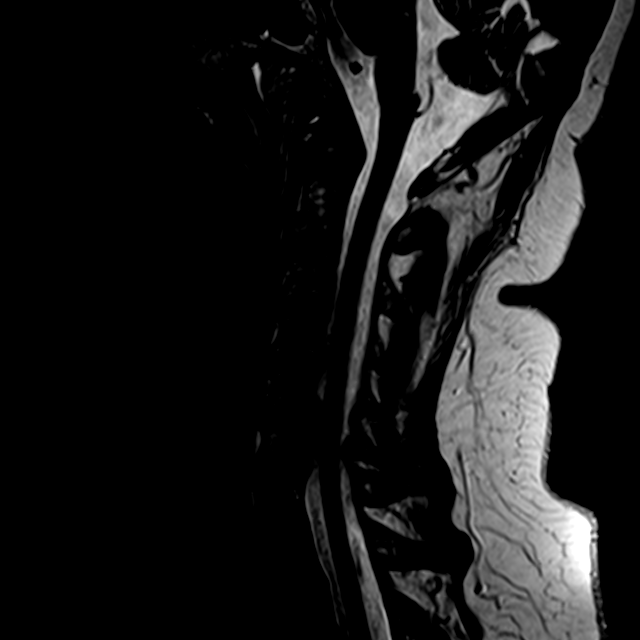
[im 15/15]
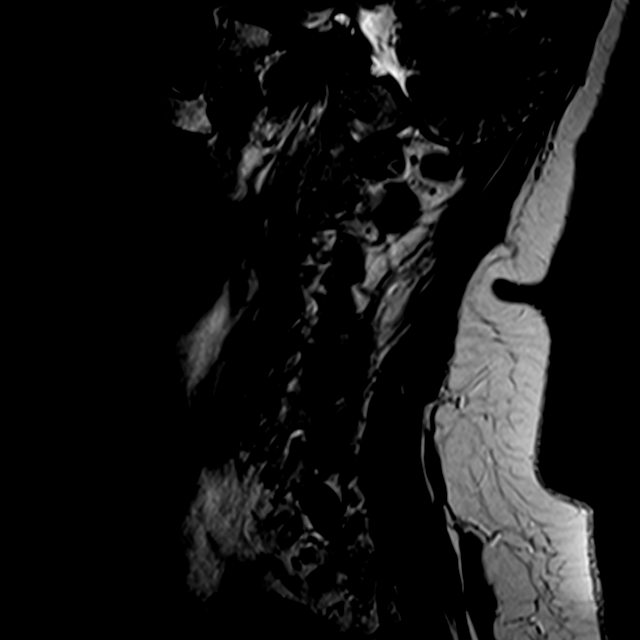

[13 of 48 positions shown; findings below may reference images not displayed]

FINDINGS: Examination is quite limited due to patient motion.

Normal overall alignment of the cervical vertebral bodies. They
demonstrate normal marrow signal. The cervical spinal cord is
grossly normal but examination is limited by motion artifact. I do
not see any obvious MS plaques. No syrinx.

C2-3:  No significant findings.

C3-4:  No significant findings.

C4-5: Disc osteophyte complex on the right with right foraminal
narrowing and encroachment on the right C5 nerve root. No spinal
stenosis or left foraminal stenosis.

C5-6: Right foraminal disc osteophyte complex with right foraminal
stenosis likely effecting the right C6 nerve root. There is mild
flattening of the ventral thecal sac and narrowing of the ventral
CSF space but no significant spinal stenosis. No significant left
foraminal stenosis.

C6-7: Shallow disc osteophyte complexes bilaterally with mild
bilateral foraminal stenosis, left slightly greater than right. No
spinal stenosis.

C7-T1:  No significant findings.
IMPRESSION: 1. Very limited examination due to patient motion. No obvious
cervical spinal cord lesions to suggest MS plaques.
2. Right-sided disc osteophyte complexes at C4-5 and C5-6 with right
foraminal encroachment on the C5 and C6 nerve roots as above.
3. Bilateral shallow disc osteophyte complexes at C6-7 with
bilateral foraminal encroachment.

## 2015-11-22 IMAGING — MR MR HEAD W/ CM
3 series · 22 of 48 positions shown · IV contrast (20ml Multihance)
Comparison: None.

CONTRAST:  20mL MULTIHANCE GADOBENATE DIMEGLUMINE 529 MG/ML IV SOLN

CLINICAL DATA: New acute onset of LEFT-sided facial drooping,
LEFT-sided weakness and numbness. Evaluate for demyelinating lesion.

EXAM:
MRI HEAD WITH CONTRAST
TECHNIQUE: Multiplanar, multiecho pulse sequences of the brain and surrounding
structures were obtained with intravenous contrast.

[Series 2: T1 post-contrast · axial · 2.0mm · 0.41mm/px · z∈[-35,+99]mm · 11 of 73 slices shown (1 of 3)]
[im 3/73]
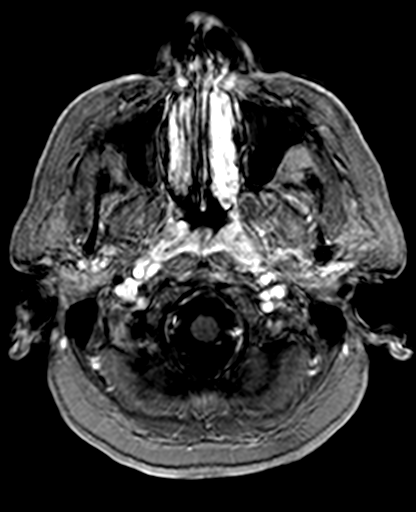
[im 11/73]
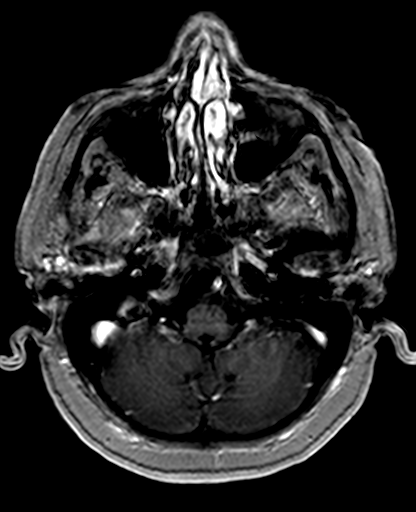
[im 13/73]
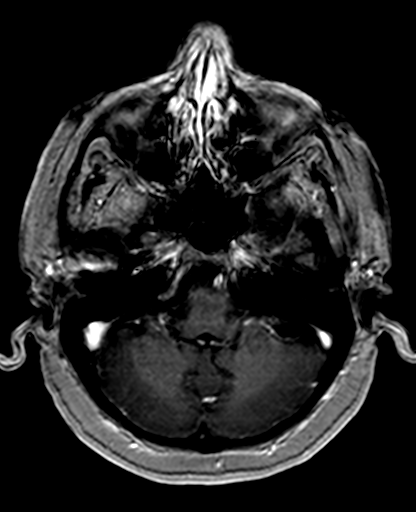
[im 24/73]
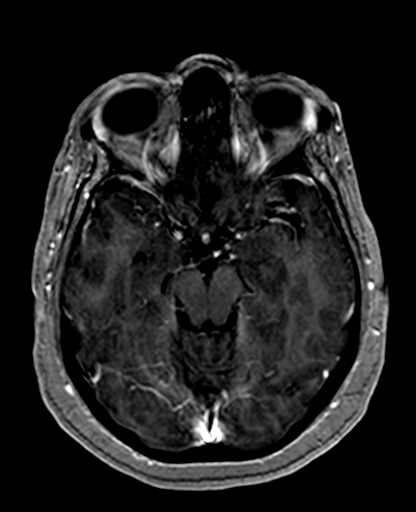
[im 31/73]
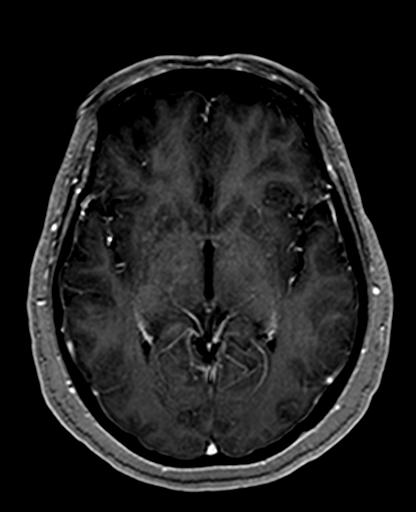
[im 37/73]
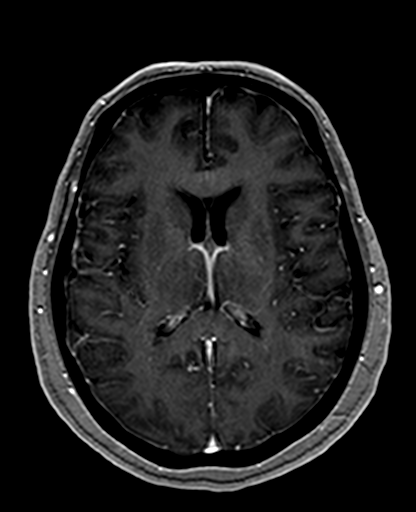
[im 42/73]
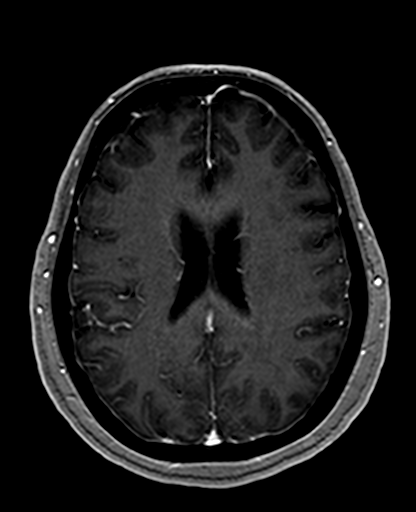
[im 49/73]
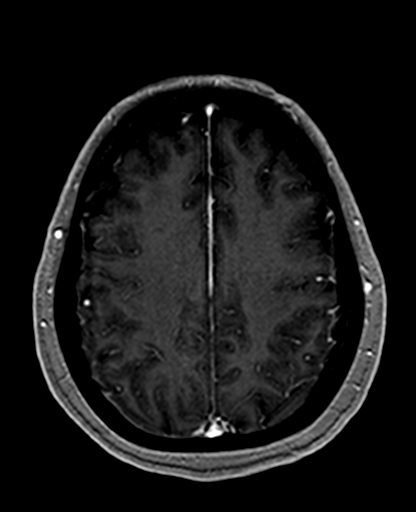
[im 60/73]
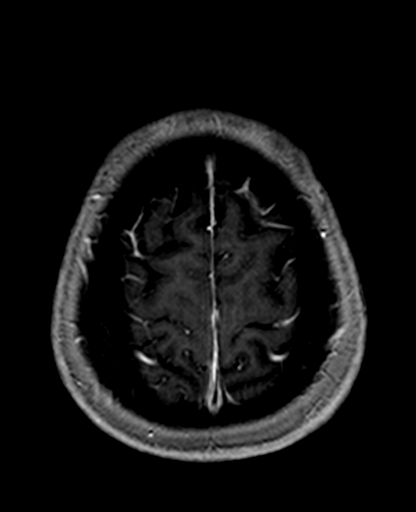
[im 62/73]
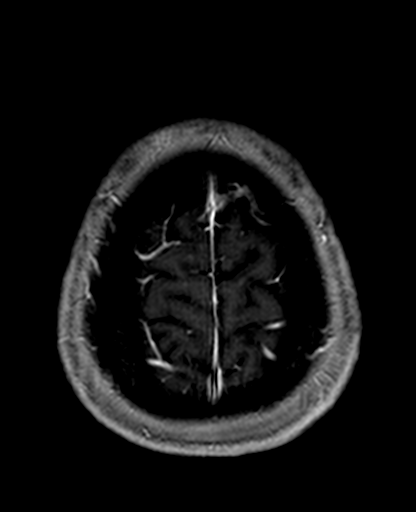
[im 70/73]
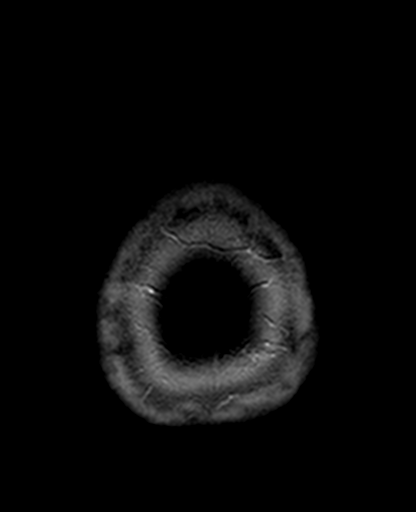

[Series 3: T1 post-contrast · coronal · 5.0mm · 0.34mm/px · 8 of 28 slices shown (2 of 3)]
[im 1/28]
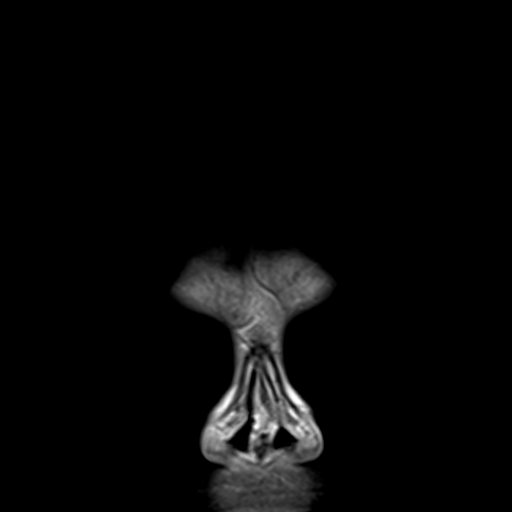
[im 3/28]
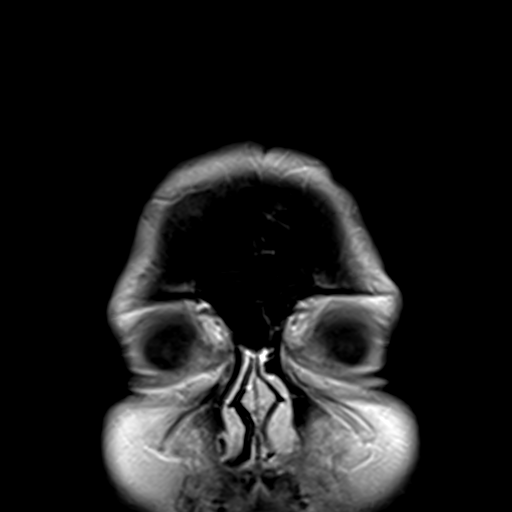
[im 6/28]
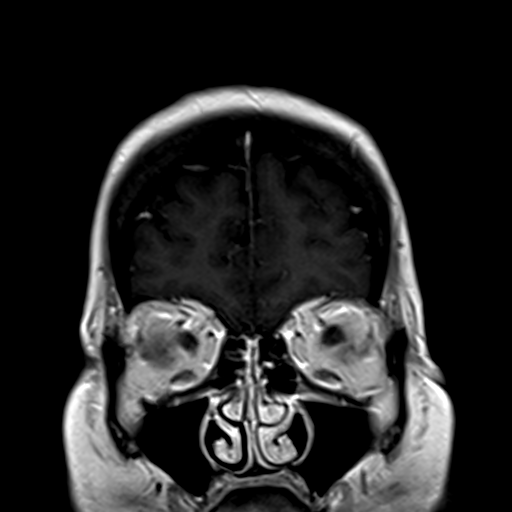
[im 9/28]
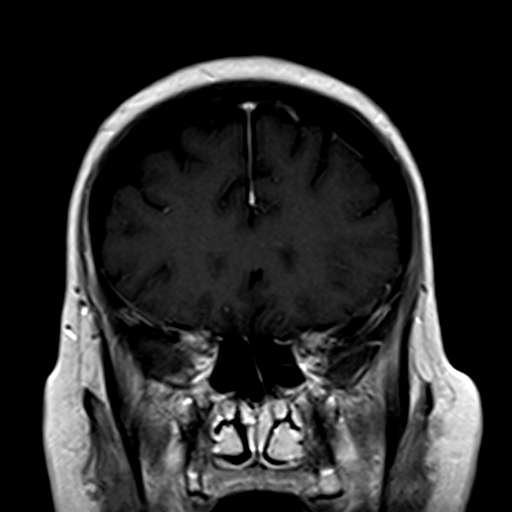
[im 11/28]
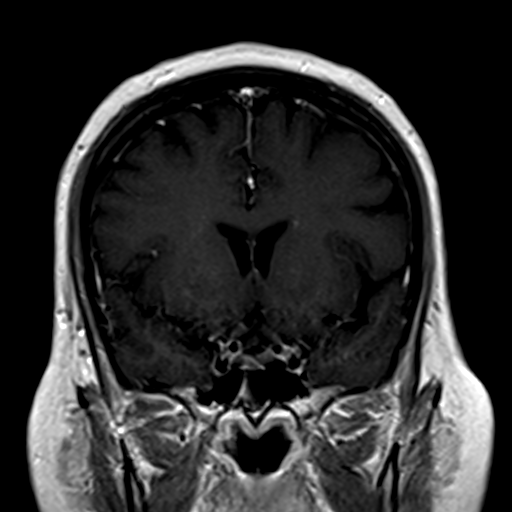
[im 14/28]
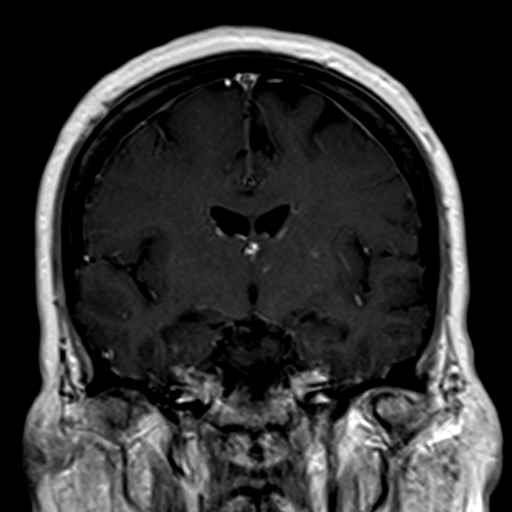
[im 17/28]
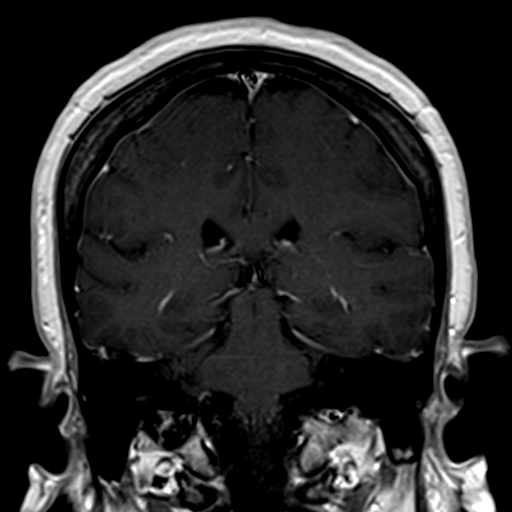
[im 25/28]
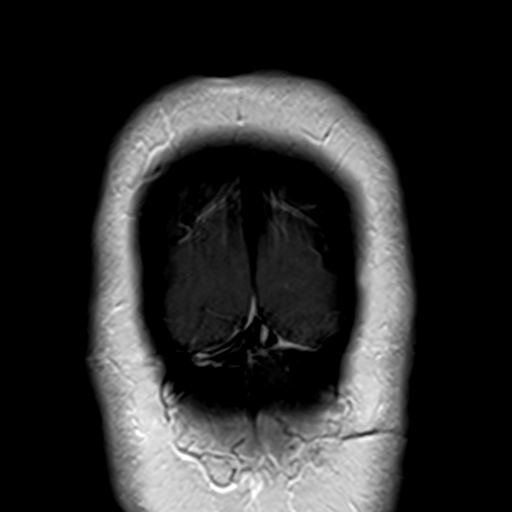

[Series 4: T1 post-contrast · sagittal · 5.0mm · 0.43mm/px · 3 of 20 slices shown (3 of 3)]
[im 3/20]
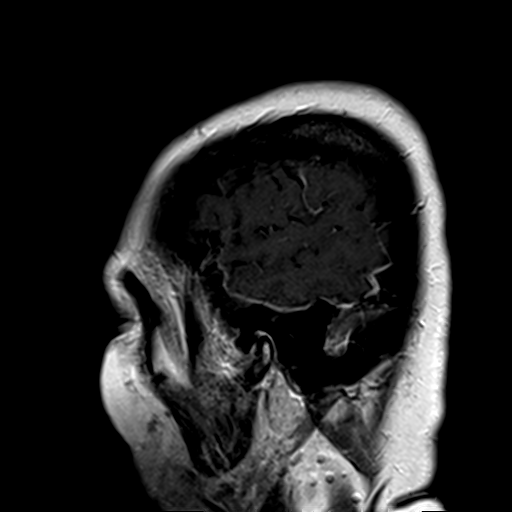
[im 11/20]
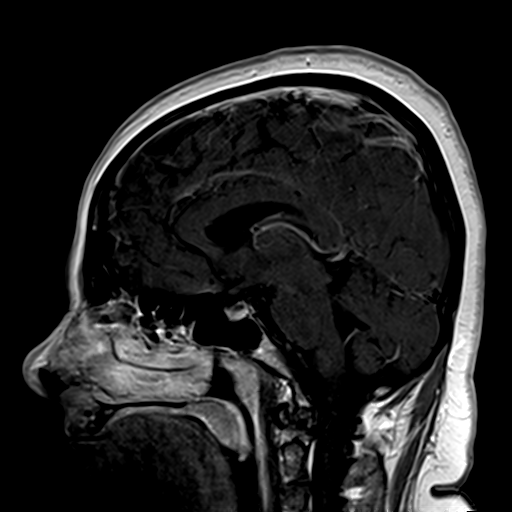
[im 17/20]
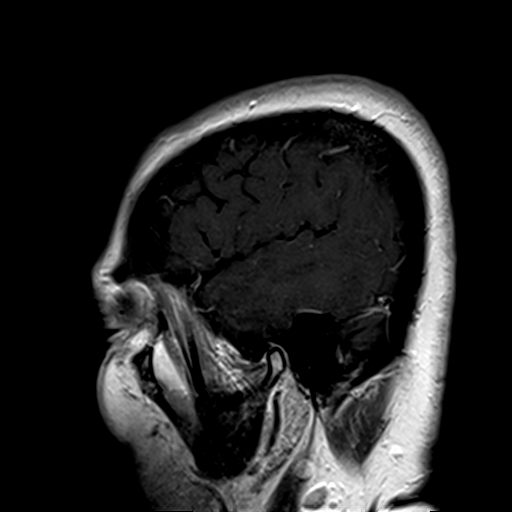

[22 of 48 positions shown; findings below may reference images not displayed]

FINDINGS: Post infusion T1 weighted images in the axial, coronal, and sagittal
plane demonstrate no abnormal postcontrast enhancement. Major dural
venous sinuses are patent.
IMPRESSION: Nonspecific white matter type changes as described on the previous
noncontrast MR brain exam are favored to represent small vessel
disease. No postcontrast enhancement of the brain or meninges to
suggest an acute process or demyelinating lesions.

## 2015-12-01 ENCOUNTER — Ambulatory Visit (HOSPITAL_COMMUNITY): Payer: Self-pay | Admitting: Psychiatry

## 2015-12-10 ENCOUNTER — Encounter (HOSPITAL_COMMUNITY): Payer: Self-pay | Admitting: Emergency Medicine

## 2015-12-10 ENCOUNTER — Emergency Department (HOSPITAL_COMMUNITY): Payer: Medicare Other

## 2015-12-10 ENCOUNTER — Emergency Department (HOSPITAL_COMMUNITY)
Admission: EM | Admit: 2015-12-10 | Discharge: 2015-12-11 | Disposition: A | Discharge status: Home / Self Care | Payer: Medicare Other | Attending: Emergency Medicine | Admitting: Emergency Medicine

## 2015-12-10 DIAGNOSIS — R079 Chest pain, unspecified: Secondary | ICD-10-CM

## 2015-12-10 DIAGNOSIS — E785 Hyperlipidemia, unspecified: Secondary | ICD-10-CM | POA: Diagnosis not present

## 2015-12-10 DIAGNOSIS — R1032 Left lower quadrant pain: Secondary | ICD-10-CM | POA: Insufficient documentation

## 2015-12-10 DIAGNOSIS — E039 Hypothyroidism, unspecified: Secondary | ICD-10-CM | POA: Diagnosis not present

## 2015-12-10 DIAGNOSIS — F17210 Nicotine dependence, cigarettes, uncomplicated: Secondary | ICD-10-CM | POA: Diagnosis not present

## 2015-12-10 DIAGNOSIS — J449 Chronic obstructive pulmonary disease, unspecified: Secondary | ICD-10-CM | POA: Diagnosis not present

## 2015-12-10 DIAGNOSIS — Z9114 Patient's other noncompliance with medication regimen: Secondary | ICD-10-CM | POA: Diagnosis not present

## 2015-12-10 DIAGNOSIS — F319 Bipolar disorder, unspecified: Secondary | ICD-10-CM | POA: Insufficient documentation

## 2015-12-10 DIAGNOSIS — I2510 Atherosclerotic heart disease of native coronary artery without angina pectoris: Secondary | ICD-10-CM | POA: Diagnosis not present

## 2015-12-10 DIAGNOSIS — M25552 Pain in left hip: Secondary | ICD-10-CM | POA: Diagnosis not present

## 2015-12-10 DIAGNOSIS — Z79899 Other long term (current) drug therapy: Secondary | ICD-10-CM | POA: Insufficient documentation

## 2015-12-10 DIAGNOSIS — I10 Essential (primary) hypertension: Secondary | ICD-10-CM

## 2015-12-10 DIAGNOSIS — E119 Type 2 diabetes mellitus without complications: Secondary | ICD-10-CM | POA: Diagnosis not present

## 2015-12-10 LAB — CBC WITH DIFFERENTIAL/PLATELET
BASOS ABS: 0.1 10*3/uL (ref 0.0–0.1)
Basophils Relative: 1 %
Eosinophils Absolute: 0.3 10*3/uL (ref 0.0–0.7)
Eosinophils Relative: 3 %
HEMATOCRIT: 38.5 % (ref 36.0–46.0)
Hemoglobin: 12.7 g/dL (ref 12.0–15.0)
LYMPHS PCT: 42 %
Lymphs Abs: 4 10*3/uL (ref 0.7–4.0)
MCH: 29.7 pg (ref 26.0–34.0)
MCHC: 33 g/dL (ref 30.0–36.0)
MCV: 90.2 fL (ref 78.0–100.0)
Monocytes Absolute: 0.5 10*3/uL (ref 0.1–1.0)
Monocytes Relative: 5 %
NEUTROS ABS: 4.6 10*3/uL (ref 1.7–7.7)
NEUTROS PCT: 49 %
Platelets: 247 10*3/uL (ref 150–400)
RBC: 4.27 MIL/uL (ref 3.87–5.11)
RDW: 15.1 % (ref 11.5–15.5)
WBC: 9.4 10*3/uL (ref 4.0–10.5)

## 2015-12-10 MED ORDER — MORPHINE SULFATE (PF) 4 MG/ML IV SOLN
4.0000 mg | Freq: Once | INTRAVENOUS | Status: AC
  Administered 2015-12-11: 4 mg via INTRAVENOUS
  Filled 2015-12-10: qty 1

## 2015-12-10 MED ORDER — ONDANSETRON HCL 4 MG/2ML IJ SOLN
4.0000 mg | Freq: Once | INTRAMUSCULAR | Status: AC
  Administered 2015-12-11: 4 mg via INTRAVENOUS
  Filled 2015-12-10: qty 2

## 2015-12-10 MED ORDER — SODIUM CHLORIDE 0.9 % IV SOLN
10.0000 mL/h | INTRAVENOUS | Status: DC
  Administered 2015-12-11: 10 mL/h via INTRAVENOUS

## 2015-12-10 NOTE — ED Notes (Addendum)
Pt c/o left groin pain that shoots to the left knee. Pt also c/o intermittent squeezing chest pain.

## 2015-12-11 ENCOUNTER — Emergency Department (HOSPITAL_COMMUNITY): Payer: Medicare Other

## 2015-12-11 DIAGNOSIS — R1032 Left lower quadrant pain: Secondary | ICD-10-CM | POA: Diagnosis not present

## 2015-12-11 LAB — COMPREHENSIVE METABOLIC PANEL
ALT: 18 U/L (ref 14–54)
AST: 17 U/L (ref 15–41)
Albumin: 3.7 g/dL (ref 3.5–5.0)
Alkaline Phosphatase: 67 U/L (ref 38–126)
Anion gap: 4 — ABNORMAL LOW (ref 5–15)
BUN: 21 mg/dL — ABNORMAL HIGH (ref 6–20)
CO2: 26 mmol/L (ref 22–32)
Calcium: 8.4 mg/dL — ABNORMAL LOW (ref 8.9–10.3)
Chloride: 109 mmol/L (ref 101–111)
Creatinine, Ser: 0.81 mg/dL (ref 0.44–1.00)
Glucose, Bld: 109 mg/dL — ABNORMAL HIGH (ref 65–99)
POTASSIUM: 3.5 mmol/L (ref 3.5–5.1)
Sodium: 139 mmol/L (ref 135–145)
Total Bilirubin: 0.1 mg/dL — ABNORMAL LOW (ref 0.3–1.2)
Total Protein: 6.9 g/dL (ref 6.5–8.1)

## 2015-12-11 LAB — D-DIMER, QUANTITATIVE (NOT AT ARMC): D DIMER QUANT: 0.5 ug{FEU}/mL (ref 0.00–0.50)

## 2015-12-11 LAB — TROPONIN I

## 2015-12-11 LAB — PROTIME-INR
INR: 1.14 (ref 0.00–1.49)
PROTHROMBIN TIME: 14.8 s (ref 11.6–15.2)

## 2015-12-11 LAB — APTT: APTT: 29 s (ref 24–37)

## 2015-12-11 NOTE — Discharge Instructions (Signed)

## 2015-12-11 NOTE — ED Provider Notes (Addendum)
CSN: [id number]     Arrival date & time 12/10/15  2151 History   First MD Initiated Contact with Patient 12/10/15 2215     Chief Complaint  Patient presents with  . Groin Pain     (Consider location/radiation/quality/duration/timing/severity/associated sxs/prior Treatment) The history is provided by the patient.   Daleysha Hubers is a 55 y.o. female presenting with left groin pain and intermittent fleeting chest pressure.  Her past medical history is significant for DM, HTN which she states has never been controlled with bp meds therefore not currently taking any bp meds, CAD per patient report, although with a clean cardiac catheterization 8/16 and history of dvt last year in her right leg.  Her groin pain is constant, severe and worsened with movement which causes radiation into her left knee.  She denies injury or overuse.  She reports the leg has been swollen but improved after applying ice. She reports the pain is similar to her last dvt pain except for location (was in right calf).   Her chest pain is fleeting, described as pressure lasting 1-2 seconds every 15 minutes on average, but more prevalent with exertion.  Regarding her blood pressure, she stopped taking her bp meds over one month ago and her pcp is aware and not happy with her.  She states she used to be on many bp meds through [facility], none of which helped, so was taken off everything.  Her new pcp continues to add the same bp meds over time and a month ago she decided she was not taking these medications any longer.   Past Medical History  Diagnosis Date  . Type 2 diabetes mellitus ([place])   . Essential hypertension   . Hyperlipidemia   . COPD (chronic obstructive pulmonary disease) ([place])   . GERD (gastroesophageal reflux disease)   . CAD (coronary artery disease)     Moderate nonobstructive 2012-2013, [place]  . Hypothyroid   . Lung nodules   . Diverticulosis   . Bipolar affective ([place])   . Left-sided weakness 03/20/2014     MRI brain negative for stroke.  . Stroke (cerebrum) [facility]) october 2016   Past Surgical History  Procedure Laterality Date  . Back surgery    . Abdominal hysterectomy    . Appendectomy    . Tonsillectomy    . Ankle surgery    . Cholecystectomy    . Nose surgery    . Cardiac catheterization N/A 01/31/2015    Procedure: Left Heart Cath and Coronary Angiography;  Surgeon: Burnell Blanks, MD;  Location: [place] [facility];  Service: Cardiovascular;  Laterality: N/A;   Family History  Problem Relation Age of Onset  . Coronary artery disease Father 50  . Emphysema Father   . Depression Mother   . Bipolar disorder Brother   . Drug abuse Brother   . Bipolar disorder Daughter    Social History  Substance Use Topics  . Smoking status: Current Every Day Smoker -- 1.50 packs/day for 40 years    Types: Cigarettes    Start date: 03/04/1971  . Smokeless tobacco: Current User     Comment: Vapor Cigs  . Alcohol Use: No     Comment: 10-06-15 per pt no    OB History    No data available     Review of Systems  Constitutional: Negative for fever and chills.  HENT: Negative for congestion and sore throat.   Eyes: Negative.   Respiratory: Negative for shortness of breath, wheezing  and stridor.   Cardiovascular: Positive for chest pain. Negative for palpitations.  Gastrointestinal: Negative for nausea and abdominal pain.  Genitourinary: Negative.   Musculoskeletal: Positive for arthralgias. Negative for myalgias, joint swelling and neck pain.  Skin: Negative.  Negative for rash and wound.  Neurological: Negative for dizziness, weakness, light-headedness, numbness and headaches.  Psychiatric/Behavioral: Negative.       Allergies  Penicillins; Pineapple; Strawberry extract; Zithromax; Aspirin; Aspartame and phenylalanine; and Poultry meal  Home Medications   Prior to Admission medications   Medication Sig Start Date End Date Taking? Authorizing Provider  amLODipine (NORVASC) 10  MG tablet Take 1 tablet (10 mg total) by mouth daily. 06/05/14  Yes Herminio Commons, MD  dexlansoprazole (DEXILANT) 60 MG capsule Take 1 capsule (60 mg total) by mouth every evening. 03/20/14  Yes Rexene Alberts, MD  gabapentin (NEURONTIN) 600 MG tablet Take 600 mg by mouth 4 (four) times daily. 07/05/15  Yes Historical Provider, MD  lisinopril (PRINIVIL,ZESTRIL) 40 MG tablet Take 40 mg by mouth daily.   Yes Historical Provider, MD  oxyCODONE-acetaminophen (PERCOCET/ROXICET) 5-325 MG tablet Take 1 tablet by mouth 3 (three) times daily as needed. 07/07/15  Yes Historical Provider, MD  prazosin (MINIPRESS) 5 MG capsule Take 1 capsule (5 mg total) by mouth at bedtime. 10/06/15  Yes Cloria Spring, MD  rOPINIRole (REQUIP) 3 MG tablet Take 1 tablet (3 mg total) by mouth at bedtime. 03/20/14  Yes Rexene Alberts, MD  zolpidem (AMBIEN) 10 MG tablet Take 1 tablet (10 mg total) by mouth at bedtime. 03/20/14  Yes Rexene Alberts, MD  clopidogrel (PLAVIX) 75 MG tablet Take 75 mg by mouth daily.    Historical Provider, MD  DULoxetine (CYMBALTA) 60 MG capsule Take 1 capsule (60 mg total) by mouth daily. 10/06/15 10/05/16  Cloria Spring, MD  EPINEPHrine (EPI-PEN) 0.3 mg/0.3 mL DEVI Inject 0.3 mg into the muscle once as needed (allergic reaction).     Historical Provider, MD   BP 182/92   Pulse 75   Temp 98.2 F (36.8 C) (Oral)   Resp 24   Ht 5\' 7"  (1.702 m)   Wt 94.8 kg   SpO2 96%   BMI 32.73 kg/m  Physical Exam  Constitutional: She appears well-developed and well-nourished.  HENT:  Head: Normocephalic and atraumatic.  Eyes: Conjunctivae are normal.  Neck: Normal range of motion.  Cardiovascular: Normal rate, regular rhythm, normal heart sounds and intact distal pulses.   Pulses:      Popliteal pulses are 2+ on the right side, and 2+ on the left side.  No ankle or lower extremity edema.  Pulmonary/Chest: Effort normal and breath sounds normal.  Abdominal: Soft. Bowel sounds are normal. There is no  tenderness.  Musculoskeletal: Normal range of motion. She exhibits no edema.       Left hip: She exhibits tenderness. She exhibits normal range of motion, no swelling, no crepitus and no deformity.  ttp left anterior upper thigh. No edema, no induration.  Neurological: She is alert.  Skin: Skin is warm and dry.  Psychiatric: She has a normal mood and affect.  Nursing note and vitals reviewed.   ED Course  Procedures (including critical care time) Labs Review  Results for orders placed or performed during the hospital encounter of 12/10/15  CBC with Differential  Result Value Ref Range   WBC 9.4 4.0 - 10.5 K/uL   RBC 4.27 3.87 - 5.11 MIL/uL   Hemoglobin 12.7 12.0 - 15.0 g/dL  HCT 38.5 36.0 - 46.0 %   MCV 90.2 78.0 - 100.0 fL   MCH 29.7 26.0 - 34.0 pg   MCHC 33.0 30.0 - 36.0 g/dL   RDW 15.1 11.5 - 15.5 %   Platelets 247 150 - 400 K/uL   Neutrophils Relative % 49 %   Neutro Abs 4.6 1.7 - 7.7 K/uL   Lymphocytes Relative 42 %   Lymphs Abs 4.0 0.7 - 4.0 K/uL   Monocytes Relative 5 %   Monocytes Absolute 0.5 0.1 - 1.0 K/uL   Eosinophils Relative 3 %   Eosinophils Absolute 0.3 0.0 - 0.7 K/uL   Basophils Relative 1 %   Basophils Absolute 0.1 0.0 - 0.1 K/uL  Protime-INR  Result Value Ref Range   Prothrombin Time 14.8 11.6 - 15.2 seconds   INR 1.14 0.00 - 1.49  APTT  Result Value Ref Range   aPTT 29 24 - 37 seconds  Comprehensive metabolic panel  Result Value Ref Range   Sodium 139 135 - 145 mmol/L   Potassium 3.5 3.5 - 5.1 mmol/L   Chloride 109 101 - 111 mmol/L   CO2 26 22 - 32 mmol/L   Glucose, Bld 109 (H) 65 - 99 mg/dL   BUN 21 (H) 6 - 20 mg/dL   Creatinine, Ser 0.81 0.44 - 1.00 mg/dL   Calcium 8.4 (L) 8.9 - 10.3 mg/dL   Total Protein 6.9 6.5 - 8.1 g/dL   Albumin 3.7 3.5 - 5.0 g/dL   AST 17 15 - 41 U/L   ALT 18 14 - 54 U/L   Alkaline Phosphatase 67 38 - 126 U/L   Total Bilirubin 0.1 (L) 0.3 - 1.2 mg/dL   GFR calc non Af Amer >60 >60 mL/min   GFR calc Af Amer >60  >60 mL/min   Anion gap 4 (L) 5 - 15  Troponin I  Result Value Ref Range   Troponin I <0.03 <0.03 ng/mL  D-dimer, quantitative (not at [facility])  Result Value Ref Range   D-Dimer, Quant 0.50 0.00 - 0.50 ug/mL-FEU   Dg Chest 2 View  12/11/2015  CLINICAL DATA:  Chest pain since yesterday. EXAM: CHEST  2 VIEW COMPARISON:  08/29/2015 FINDINGS: Persistent but improved bronchial thickening. The cardiomediastinal contours are normal. Minimal scarring at the left lung base. Pulmonary vasculature is normal. No consolidation, pleural effusion, or pneumothorax. No acute osseous abnormalities are seen. IMPRESSION: Improved bronchial thickening.  No new abnormality is seen. Electronically Signed   By: Jeb Levering M.D.   On: 12/11/2015 00:25   Dg Hip Unilat With Pelvis 2-3 Views Left  12/11/2015  CLINICAL DATA:  Acute onset of left groin pain.  Initial encounter. EXAM: DG HIP (WITH OR WITHOUT PELVIS) 2-3V LEFT COMPARISON:  None. FINDINGS: There is no evidence of fracture or dislocation. Both femoral heads are seated normally within their respective acetabula. The proximal left femur appears intact. Lumbosacral spinal fusion hardware is noted. The sacroiliac joints are unremarkable in appearance. The visualized bowel gas pattern is grossly unremarkable in appearance. IMPRESSION: No evidence of fracture or dislocation. Electronically Signed   By: Garald Balding M.D.   On: 12/11/2015 01:19      Imaging Review  I have personally reviewed and evaluated these images and lab results as part of my medical decision-making.   EKG Interpretation   Date/Time:  Wednesday December 10 2015 22:07:57 EDT Ventricular Rate:  87 PR Interval:    QRS Duration: 95 QT Interval:  394 QTC Calculation: 474  R Axis:   43 Text Interpretation:  Sinus rhythm Probable left atrial enlargement No  significant change since last tracing Confirmed by ZACKOWSKI  MD, SCOTT  [phone]) on 12/10/2015 11:45:00 PM      MDM   Final diagnoses:   Chest pain, unspecified chest pain type  Hip pain, acute, left  Essential hypertension  Noncompliance with medication regimen    Patients  labs reviewed.  Radiological studies were viewed, interpreted and considered during the medical decision making and disposition process. I agree with radiologists reading.  Results were also discussed with patient.  Pt with chronic htn with medication noncomplaince along with normal cardiac cath 11 months ago.  No acute findings today regarding chest pain complaint which is fleeting so unlikely of cardiac origin.  Left groin/anterior hip pain which is reproducible on exam, no exam findings to suggest dvt.  Negative d dimer. Suspect body wall source.  Pt advised to continue current chronic pain meds, may try heating pad to hip/groin.  F/u with pcp for a recheck of sx if not improving with this tx.    The patient appears reasonably screened and/or stabilized for discharge and I doubt any other medical condition or other [facility] requiring further screening, evaluation, or treatment in the ED at this time prior to discharge.      Evalee Jefferson, PA-C 12/11/15 [id number]  Rolland Porter, MD 12/11/15 [place], PA-C 01/09/16 [place], MD 01/10/16 [phone]

## 2015-12-18 IMAGING — CR DG CHEST 1V PORT
1 series · 1 of 1 positions shown · non-contrast
Comparison: PA and lateral chest 11/22/2011 and 03/19/2014.

CLINICAL DATA: Chest pain radiating to the left shoulder.

EXAM:
PORTABLE CHEST - 1 VIEW

[portable]
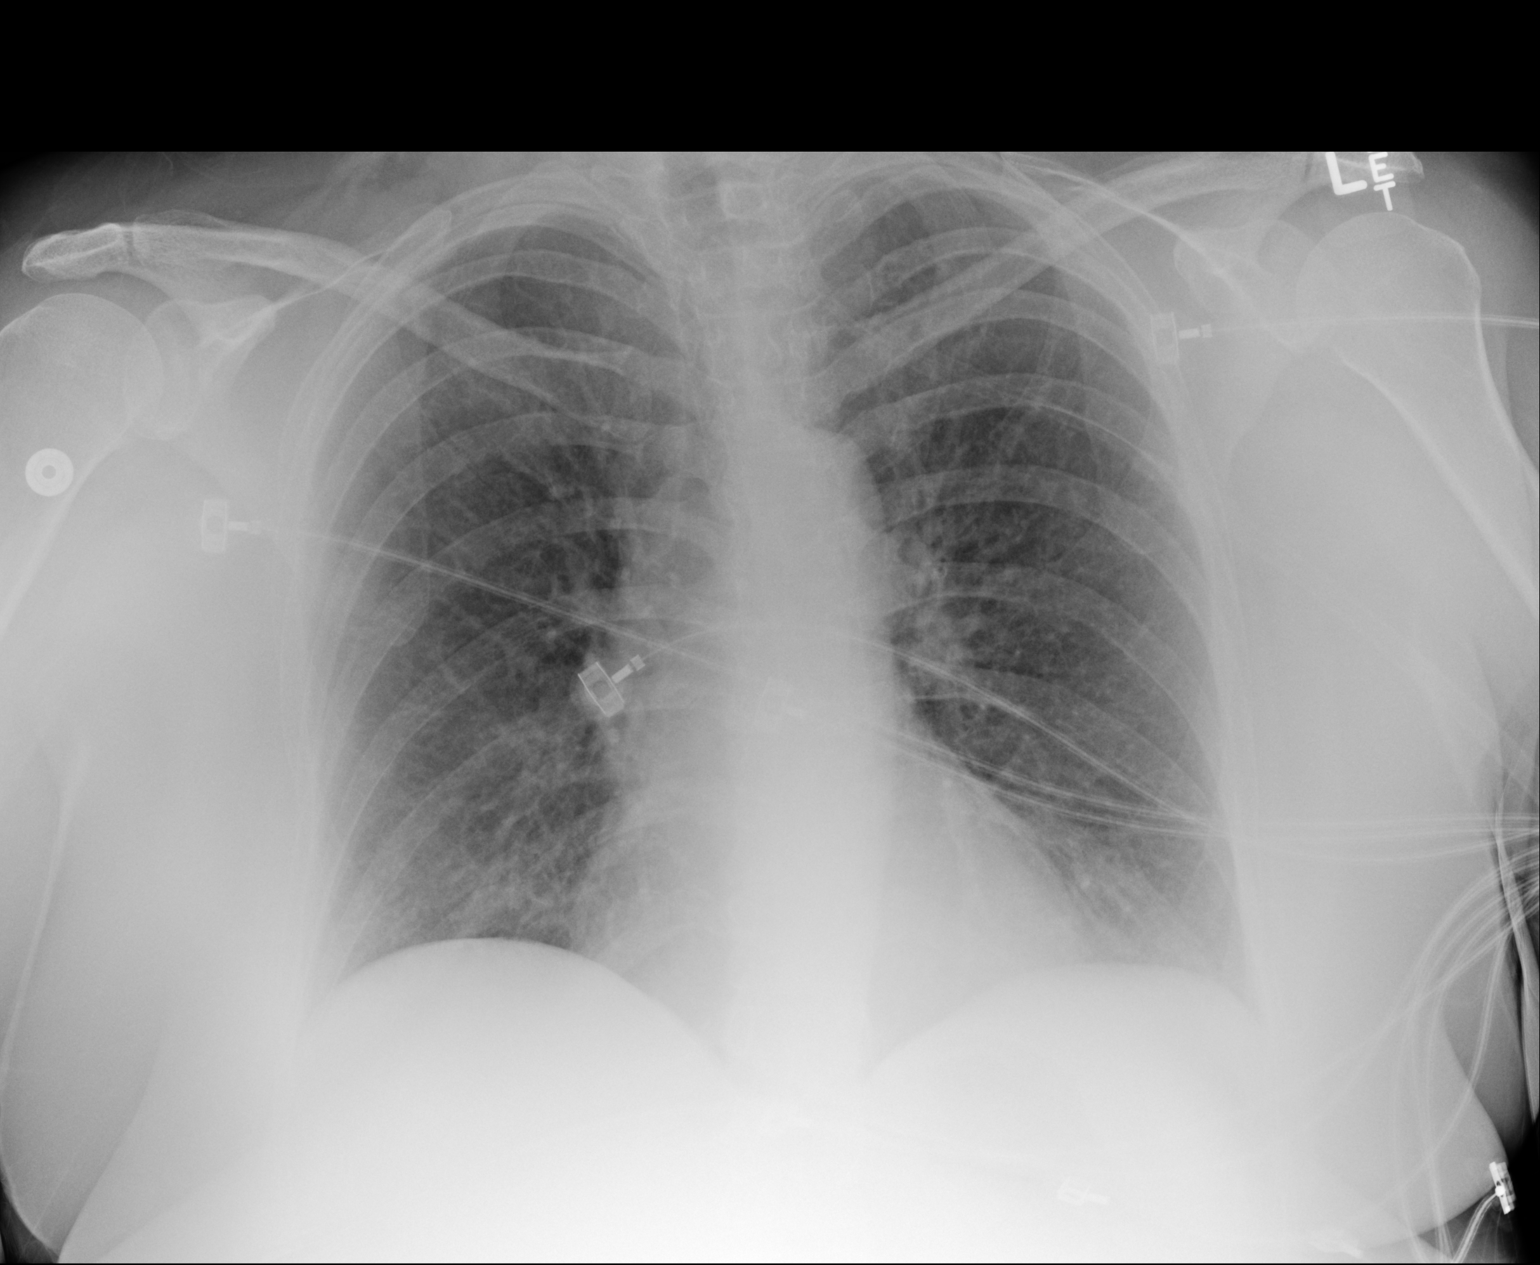

[1 of 1 positions shown; findings below may reference images not displayed]

FINDINGS: The lungs are clear. No pneumothorax or pleural effusion. Heart size
is normal.
IMPRESSION: No acute disease.

## 2015-12-25 ENCOUNTER — Other Ambulatory Visit (HOSPITAL_COMMUNITY): Payer: Self-pay | Admitting: Orthopedic Surgery

## 2015-12-26 ENCOUNTER — Other Ambulatory Visit (HOSPITAL_COMMUNITY): Payer: Self-pay | Admitting: Orthopedic Surgery

## 2015-12-26 DIAGNOSIS — M5416 Radiculopathy, lumbar region: Secondary | ICD-10-CM

## 2015-12-31 ENCOUNTER — Other Ambulatory Visit (HOSPITAL_COMMUNITY): Payer: Self-pay | Admitting: Orthopedic Surgery

## 2015-12-31 DIAGNOSIS — M5416 Radiculopathy, lumbar region: Secondary | ICD-10-CM

## 2016-01-01 ENCOUNTER — Ambulatory Visit: Payer: Medicare Other | Admitting: Physical Therapy

## 2016-01-06 ENCOUNTER — Emergency Department (HOSPITAL_COMMUNITY): Payer: Medicare Other

## 2016-01-06 ENCOUNTER — Encounter (HOSPITAL_COMMUNITY): Payer: Self-pay

## 2016-01-06 ENCOUNTER — Observation Stay (HOSPITAL_COMMUNITY)
Admission: EM | Admit: 2016-01-06 | Discharge: 2016-01-07 | Discharge status: Against Medical Advice | Payer: Medicare Other | Attending: Family Medicine | Admitting: Family Medicine

## 2016-01-06 DIAGNOSIS — F4310 Post-traumatic stress disorder, unspecified: Secondary | ICD-10-CM | POA: Diagnosis not present

## 2016-01-06 DIAGNOSIS — I252 Old myocardial infarction: Secondary | ICD-10-CM | POA: Diagnosis not present

## 2016-01-06 DIAGNOSIS — K219 Gastro-esophageal reflux disease without esophagitis: Secondary | ICD-10-CM | POA: Diagnosis not present

## 2016-01-06 DIAGNOSIS — F17210 Nicotine dependence, cigarettes, uncomplicated: Secondary | ICD-10-CM | POA: Insufficient documentation

## 2016-01-06 DIAGNOSIS — G43909 Migraine, unspecified, not intractable, without status migrainosus: Secondary | ICD-10-CM | POA: Insufficient documentation

## 2016-01-06 DIAGNOSIS — Z881 Allergy status to other antibiotic agents status: Secondary | ICD-10-CM | POA: Insufficient documentation

## 2016-01-06 DIAGNOSIS — I160 Hypertensive urgency: Secondary | ICD-10-CM | POA: Diagnosis not present

## 2016-01-06 DIAGNOSIS — I2510 Atherosclerotic heart disease of native coronary artery without angina pectoris: Secondary | ICD-10-CM | POA: Insufficient documentation

## 2016-01-06 DIAGNOSIS — E039 Hypothyroidism, unspecified: Secondary | ICD-10-CM | POA: Insufficient documentation

## 2016-01-06 DIAGNOSIS — R079 Chest pain, unspecified: Secondary | ICD-10-CM | POA: Diagnosis present

## 2016-01-06 DIAGNOSIS — E119 Type 2 diabetes mellitus without complications: Secondary | ICD-10-CM | POA: Diagnosis not present

## 2016-01-06 DIAGNOSIS — Z886 Allergy status to analgesic agent status: Secondary | ICD-10-CM | POA: Diagnosis not present

## 2016-01-06 DIAGNOSIS — R0602 Shortness of breath: Secondary | ICD-10-CM | POA: Insufficient documentation

## 2016-01-06 DIAGNOSIS — E876 Hypokalemia: Secondary | ICD-10-CM | POA: Diagnosis not present

## 2016-01-06 DIAGNOSIS — Z880 Allergy status to penicillin: Secondary | ICD-10-CM | POA: Diagnosis not present

## 2016-01-06 DIAGNOSIS — J449 Chronic obstructive pulmonary disease, unspecified: Secondary | ICD-10-CM | POA: Diagnosis not present

## 2016-01-06 DIAGNOSIS — Z9119 Patient's noncompliance with other medical treatment and regimen: Secondary | ICD-10-CM | POA: Diagnosis not present

## 2016-01-06 DIAGNOSIS — F319 Bipolar disorder, unspecified: Secondary | ICD-10-CM | POA: Insufficient documentation

## 2016-01-06 DIAGNOSIS — Z6832 Body mass index (BMI) 32.0-32.9, adult: Secondary | ICD-10-CM | POA: Diagnosis not present

## 2016-01-06 DIAGNOSIS — I10 Essential (primary) hypertension: Secondary | ICD-10-CM | POA: Diagnosis not present

## 2016-01-06 DIAGNOSIS — Z91018 Allergy to other foods: Secondary | ICD-10-CM | POA: Insufficient documentation

## 2016-01-06 DIAGNOSIS — Z8673 Personal history of transient ischemic attack (TIA), and cerebral infarction without residual deficits: Secondary | ICD-10-CM | POA: Diagnosis not present

## 2016-01-06 DIAGNOSIS — E785 Hyperlipidemia, unspecified: Secondary | ICD-10-CM | POA: Diagnosis not present

## 2016-01-06 DIAGNOSIS — Z66 Do not resuscitate: Secondary | ICD-10-CM | POA: Insufficient documentation

## 2016-01-06 LAB — PROTIME-INR
INR: 1.03
PROTHROMBIN TIME: 13.6 s (ref 11.4–15.2)

## 2016-01-06 LAB — CBC
HEMATOCRIT: 40.8 % (ref 36.0–46.0)
Hemoglobin: 13.3 g/dL (ref 12.0–15.0)
MCH: 29.4 pg (ref 26.0–34.0)
MCHC: 32.6 g/dL (ref 30.0–36.0)
MCV: 90.1 fL (ref 78.0–100.0)
PLATELETS: 235 10*3/uL (ref 150–400)
RBC: 4.53 MIL/uL (ref 3.87–5.11)
RDW: 14.6 % (ref 11.5–15.5)
WBC: 11.1 10*3/uL — AB (ref 4.0–10.5)

## 2016-01-06 LAB — BASIC METABOLIC PANEL
Anion gap: 2 — ABNORMAL LOW (ref 5–15)
BUN: 16 mg/dL (ref 6–20)
CHLORIDE: 111 mmol/L (ref 101–111)
CO2: 26 mmol/L (ref 22–32)
CREATININE: 0.81 mg/dL (ref 0.44–1.00)
Calcium: 8.5 mg/dL — ABNORMAL LOW (ref 8.9–10.3)
Glucose, Bld: 127 mg/dL — ABNORMAL HIGH (ref 65–99)
POTASSIUM: 3.5 mmol/L (ref 3.5–5.1)
SODIUM: 139 mmol/L (ref 135–145)

## 2016-01-06 LAB — TROPONIN I: Troponin I: <0.03 ng/mL (ref <0.03)

## 2016-01-06 NOTE — ED Triage Notes (Signed)
I have been having chest discomfort the the past couple of days and my blood pressure has been up higher than normal.

## 2016-01-06 NOTE — ED Provider Notes (Signed)
Emergency Department Provider Note  By signing my name below, I, Dora Sims, attest that this documentation has been prepared under the direction and in the presence of physician practitioner, Margette Fast, MD. Electronically Signed: Dora Sims, Scribe. 01/06/2016. 9:27 PM.  I have reviewed the triage vital signs and the nursing notes.   HISTORY  Chief Complaint Chest Pain   HPI Comments: Katrina [name] is a 55 y.o. female with extensive PMHx, including COPD, GERD, HTN, CAD, diastolic dysfunction, chest pain, cerebrovascular disease, migraine, hypokalemia, dyspnea, and PUP who presents to the Emergency Department complaining of sudden onset, intermittent, chest pain beginning yesterday. She describes her chest pain as a pressure-like sensation. She notes associated SOB, headache, dizziness, and lightheadedness. Pt states she has felt like she was going to pass out several times since onset. Pt reports that her blood pressure has been elevated over the last several days and notes that she has experienced chest pain in association with elevated blood pressure in the past. Pt reports that her current chest pain and SOB are more significant than in the past; she states the pressure sensation in her chest is more severe. She also states her current episodes of chest pain have been presenting for longer than in the past. She reports chest pain currently. No alleviating or exacerbating factors noted. Pt notes a h/o of MI (in 2000) as well as TIA x2 (October 2016 and April 2017). She reports that she last had a regular cardiologist one year ago; she saw a cardiologist last month but has not seen one since. Pt had a cardiac catheterization test in August 2016 which revealed small blockages; she did not have coronary stents placed. Pt reports she uses Norvasc and Plavix for HTN. Pt reports she is followed by a nurse practitioner for her blood pressure and she also goes to [facility] every 3 months  for her blood pressure. She states she cannot take nitroglycerin because they cause migraines and she cannot take aspirin due to peptic ulcers. She denies numbness, weakness, or any other associated symptoms.  Past Medical History:  Diagnosis Date  . Bipolar affective ([place])   . CAD (coronary artery disease)    Moderate nonobstructive 2012-2013, [place]  . COPD (chronic obstructive pulmonary disease) ([place])   . Diverticulosis   . Essential hypertension   . GERD (gastroesophageal reflux disease)   . Hyperlipidemia   . Hypertension   . Hypothyroid   . Left-sided weakness 03/20/2014   MRI brain negative for stroke.  . Lung nodules   . 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 Type 2 diabetes mellitus [facility])     Patient Active Problem List   Diagnosis Date Noted  . Post traumatic stress disorder 10/06/2015  . Precordial pain   . Migraine syndrome 01/24/2015  . Migraine 01/24/2015  . Accelerated hypertension 12/25/2014  . TIA (transient ischemic attack) 12/25/2014  . Hypertensive urgency   . Other chest pain   . Malignant hypertension   . Hypokalemia 04/16/2014  . Muscle weakness (generalized) 04/05/2014  . Stiffness of left hip joint 04/05/2014  . Pain in left hip 04/05/2014  . Diastolic dysfunction [id number]  . CAD (coronary artery disease) 03/20/2014  . PUD (peptic ulcer disease) 03/20/2014  . Type 2 diabetes, uncontrolled, with neuropathy ([place]) 03/20/2014  . Morbid obesity ([place]) 03/20/2014  . Left-sided weakness 03/20/2014  . Paresthesias 03/20/2014  . Cerebrovascular disease 03/20/2014  . Dyspnea 03/05/2012  . Chest pain 03/05/2012  . Tobacco abuse 03/05/2012  .  COPD (chronic obstructive pulmonary disease) ([place]) 11/23/2011  . GERD (gastroesophageal reflux disease) 11/23/2011  . HTN (hypertension) 11/23/2011    Past Surgical History:  Procedure Laterality Date  . ABDOMINAL HYSTERECTOMY    . ANKLE SURGERY    . APPENDECTOMY    . BACK SURGERY    . CARDIAC  CATHETERIZATION N/A 01/31/2015   Procedure: Left Heart Cath and Coronary Angiography;  Surgeon: Burnell Blanks, MD;  Location: [place] [facility];  Service: Cardiovascular;  Laterality: N/A;  . CHOLECYSTECTOMY    . NOSE SURGERY    . TONSILLECTOMY      Current Outpatient Rx  . Order #: [id number] Class: Normal  . Order #: [id number] Class: Historical Med  . Order #: [id number] Class: Print  . Order #: [id number] Class: Normal  . Order #: [id number] Class: Historical Med  . Order #: [id number] Class: Historical Med  . Order #: [id number] Class: Historical Med  . Order #: [id number] Class: Historical Med  . Order #: [id number] Class: Normal  . Order #: [id number] Class: Print  . Order #: [id number] Class: Print    Allergies Penicillins; Pineapple; Strawberry extract; Zithromax [azithromycin]; Aspirin; Aspartame and phenylalanine; and Poultry meal  Family History  Problem Relation Age of Onset  . Coronary artery disease Father 38  . Emphysema Father   . Depression Mother   . Bipolar disorder Brother   . Drug abuse Brother   . Bipolar disorder Daughter     Social History Social History  Substance Use Topics  . Smoking status: Current Every Day Smoker    Packs/day: 2.00    Years: 40.00    Types: Cigarettes    Start date: 03/04/1971  . Smokeless tobacco: Current User     Comment: Vapor Cigs  . Alcohol use No     Comment: 10-06-15 per pt no     Review of Systems  onstitutional: No fever/chills Eyes: No visual changes. ENT: No sore throat. Cardiovascular: Positive for chest pain. Respiratory: Positive for shortness of breath. Gastrointestinal: No abdominal pain.  No nausea, no vomiting.  No diarrhea.  No constipation. Genitourinary: Negative for dysuria. Musculoskeletal: Negative for back pain. Skin: Negative for rash. Neurological: Positive for headaches, dizziness, and lightheadedness. Negative for focal weakness or numbness.  10-point ROS otherwise  negative.  ____________________________________________   PHYSICAL EXAM:  VITAL SIGNS: ED Triage Vitals  Enc Vitals Group     BP 01/06/16 2013 (!) 213/99     Pulse Rate 01/06/16 2013 94     Resp 01/06/16 2013 20     Temp 01/06/16 2013 98.8 F (37.1 C)     Temp Source 01/06/16 2013 Oral     SpO2 01/06/16 2013 98 %     Weight 01/06/16 2013 210 lb (95.3 kg)     Height 01/06/16 2013 5\' 7"  (1.702 m)     Pain Score 01/06/16 2014 8    Constitutional: Alert and oriented. Well appearing and in no acute distress. Eyes: Conjunctivae are normal. PERRL Head: Atraumatic. Nose: No congestion/rhinnorhea. Mouth/Throat: Mucous membranes are moist. Oropharynx non-erythematous. Neck: No stridor. Cardiovascular: Normal rate, regular rhythm. Good peripheral circulation. Grossly normal heart sounds.   Respiratory: Normal respiratory effort.  No retractions. Lungs CTAB. Gastrointestinal: Soft and nontender. No distention.  Musculoskeletal: No lower extremity tenderness nor edema. No gross deformities of extremities. Neurologic:  Normal speech and language. No gross focal neurologic deficits are appreciated.  Skin:  Skin is warm, dry and intact. No rash noted. Psychiatric: Mood and affect are normal. Speech and behavior are normal.  ____________________________________________  LABS (all labs ordered are listed, but only abnormal results are displayed)  Labs Reviewed  BASIC METABOLIC PANEL - Abnormal; Notable for the following:       Result Value   Glucose, Bld 127 (*)    Calcium 8.5 (*)    Anion gap 2 (*)    All other components within normal limits  CBC - Abnormal; Notable for the following:    WBC 11.1 (*)    All other components within normal limits  MRSA PCR SCREENING  TROPONIN I  PROTIME-INR  TROPONIN I  TROPONIN I  TROPONIN I   ____________________________________________  EKG  Reviewed. No STEMI.  ____________________________________________  RADIOLOGY  Dg Chest 2  View  Result Date: 01/06/2016 CLINICAL DATA:  55 year old female with hypertension chest pain and shortness of breath. Initial encounter. Smoker. EXAM: CHEST  2 VIEW COMPARISON:  12/10/2015 and earlier. FINDINGS: Stable lung volumes. Normal cardiac size and mediastinal contours. Visualized tracheal air column is within normal limits. Mild increased interstitial markings are stable. No pneumothorax, pulmonary edema, pleural effusion or confluent pulmonary opacity. No acute osseous abnormality identified. IMPRESSION: No acute cardiopulmonary abnormality. Electronically Signed   By: Genevie Ann M.D.   On: 01/06/2016 22:49    ____________________________________________   PROCEDURES  Procedure(s) performed:   Procedures  None ____________________________________________   INITIAL IMPRESSION / ASSESSMENT AND PLAN / ED COURSE  Pertinent labs & imaging results that were available during my care of the patient were reviewed by me and considered in my medical decision making (see chart for details).  Patient is a 55 yo female with PMH of COPD, HTN, CAD, GERD, and multiple prior CVA Presents to the emergency department for evaluation of intermittent chest discomfort starting today. Patient does have history of prior angina but reports this is lasting longer and describes it as more pressure. The patient does not currently have a cardiologist. Last heart catheterization was August 2016. Also with severe HTN and long history of medication non-compliance. Patient reports that she cannot take ASA or Nitroglycerine.   11:19 PM EKG unchanged. Initial troponin is negative. Discussed results with the patient and discussed case with Dr. Darrick Meigs with hospitalist group. Will admit to stepdown with severe elevated BP and chest pain.   Reviewed all labs, EKG, and CXR.   Documentation performed with the assistance of a scribe. I have reviewed the documentation and made changes as necessary.     ____________________________________________  FINAL CLINICAL IMPRESSION(S) / ED DIAGNOSES  Final diagnoses:  Nonspecific chest pain     MEDICATIONS GIVEN DURING THIS VISIT:  Medications  DULoxetine (CYMBALTA) DR capsule 60 mg (not administered)  diazepam (VALIUM) tablet 5 mg (not administered)  rOPINIRole (REQUIP) tablet 4 mg (4 mg Oral Given 01/07/16 0129)  triamterene-hydrochlorothiazide (MAXZIDE-25) 37.5-25 MG per tablet 1 tablet (not administered)  clopidogrel (PLAVIX) tablet 75 mg (not administered)  gabapentin (NEURONTIN) capsule 600 mg (600 mg Oral Given 01/07/16 0131)  amLODipine (NORVASC) tablet 10 mg (not administered)  zolpidem (AMBIEN) tablet 10 mg (10 mg Oral Given 01/07/16 0130)  acetaminophen (TYLENOL) tablet 650 mg (not administered)  ondansetron (ZOFRAN) injection 4 mg (not administered)  metoprolol tartrate (LOPRESSOR) tablet 25 mg (25 mg Oral Given 01/07/16 0130)  hydrALAZINE (APRESOLINE) tablet 25 mg (25 mg Oral Refused 01/07/16 0627)  morphine 2 MG/ML injection 2 mg (2 mg Intravenous Given 01/07/16 0637)  gabapentin (NEURONTIN) 300 MG capsule (not administered)  enoxaparin (LOVENOX) injection 40 mg (not administered)     NEW OUTPATIENT MEDICATIONS STARTED  DURING THIS VISIT:  None   Note:  This document was prepared using Dragon voice recognition software and may include unintentional dictation errors.  Nanda Quinton, MD Emergency Medicine   Margette Fast, MD 01/07/16 [phone]

## 2016-01-06 NOTE — ED Notes (Signed)
Unable to obtain IV access. Patient unwilling to let second attempt. Charge nurse unable to obtain IV access.

## 2016-01-06 NOTE — ED Notes (Signed)
Attempted iv in right hand that infiltrated.

## 2016-01-06 NOTE — ED Notes (Signed)
Phlebotomy at bedside.

## 2016-01-07 DIAGNOSIS — I160 Hypertensive urgency: Secondary | ICD-10-CM | POA: Diagnosis not present

## 2016-01-07 DIAGNOSIS — R079 Chest pain, unspecified: Secondary | ICD-10-CM | POA: Diagnosis not present

## 2016-01-07 MED ORDER — ACETAMINOPHEN 325 MG PO TABS: 650.0000 mg | ORAL_TABLET | ORAL | Status: DC | PRN

## 2016-01-07 MED ORDER — ROPINIROLE HCL 1 MG PO TABS
4.0000 mg | ORAL_TABLET | Freq: Every day | ORAL | Status: DC
  Administered 2016-01-07: 4 mg via ORAL
  Filled 2016-01-07 (×3): qty 4

## 2016-01-07 MED ORDER — GABAPENTIN 300 MG PO CAPS
600.0000 mg | ORAL_CAPSULE | Freq: Four times a day (QID) | ORAL | Status: DC
  Administered 2016-01-07: 600 mg via ORAL

## 2016-01-07 MED ORDER — ROPINIROLE HCL 1 MG PO TABS
ORAL_TABLET | ORAL | Status: AC
  Filled 2016-01-07: qty 4

## 2016-01-07 MED ORDER — ZOLPIDEM TARTRATE 5 MG PO TABS
10.0000 mg | ORAL_TABLET | Freq: Every day | ORAL | Status: DC
  Administered 2016-01-07: 10 mg via ORAL
  Filled 2016-01-07: qty 2

## 2016-01-07 MED ORDER — METOPROLOL TARTRATE 25 MG PO TABS
25.0000 mg | ORAL_TABLET | Freq: Two times a day (BID) | ORAL | Status: DC
  Administered 2016-01-07: 25 mg via ORAL
  Filled 2016-01-07: qty 1

## 2016-01-07 MED ORDER — TRIAMTERENE-HCTZ 37.5-25 MG PO TABS
1.0000 | ORAL_TABLET | Freq: Every day | ORAL | Status: DC
Start: 2016-01-07 — End: 2016-01-07

## 2016-01-07 MED ORDER — GABAPENTIN 300 MG PO CAPS
ORAL_CAPSULE | ORAL | Status: AC
  Filled 2016-01-07: qty 2

## 2016-01-07 MED ORDER — ONDANSETRON HCL 4 MG/2ML IJ SOLN: 4.0000 mg | Freq: Four times a day (QID) | INTRAMUSCULAR | Status: DC | PRN

## 2016-01-07 MED ORDER — CLOPIDOGREL BISULFATE 75 MG PO TABS: 75.0000 mg | ORAL_TABLET | Freq: Every day | ORAL | Status: DC

## 2016-01-07 MED ORDER — MORPHINE SULFATE (PF) 2 MG/ML IV SOLN
2.0000 mg | INTRAVENOUS | Status: DC | PRN
  Administered 2016-01-07 (×2): 2 mg via INTRAVENOUS
  Filled 2016-01-07 (×2): qty 1

## 2016-01-07 MED ORDER — AMLODIPINE BESYLATE 5 MG PO TABS: 10.0000 mg | ORAL_TABLET | Freq: Every day | ORAL | Status: DC

## 2016-01-07 MED ORDER — DULOXETINE HCL 60 MG PO CPEP: 60.0000 mg | ORAL_CAPSULE | Freq: Every day | ORAL | Status: DC

## 2016-01-07 MED ORDER — HYDRALAZINE HCL 25 MG PO TABS
25.0000 mg | ORAL_TABLET | Freq: Four times a day (QID) | ORAL | Status: DC | PRN
  Filled 2016-01-07: qty 1

## 2016-01-07 MED ORDER — DIAZEPAM 5 MG PO TABS: 5.0000 mg | ORAL_TABLET | Freq: Every evening | ORAL | Status: DC | PRN

## 2016-01-07 MED ORDER — ENOXAPARIN SODIUM 40 MG/0.4ML ~~LOC~~ SOLN
40.0000 mg | SUBCUTANEOUS | Status: DC
  Filled 2016-01-07: qty 0.4

## 2016-01-07 MED ORDER — ENOXAPARIN SODIUM 40 MG/0.4ML ~~LOC~~ SOLN: 40.0000 mg | SUBCUTANEOUS | Status: DC

## 2016-01-07 NOTE — ED Notes (Signed)
Pt offered 3 different meals. Vegetarian stew, chicken and dumplings, and or roast beef and gravy. Pt states "I have food allergies and I can't eat but certain things." Pt then states "I will just go to the vending machine and get me some chips."

## 2016-01-07 NOTE — ED Notes (Signed)
Pt now stating that she was fine sitting here in the ED but she does not want her blood drawn. Pt does not want leave at this time. Dr. Darrick Meigs paged and made aware.

## 2016-01-07 NOTE — ED Notes (Signed)
Lab went into this pt's room and pt refused to let her stick her. Pt stated to this nurse that she was going home. This RN spoke with this pt and she said, "yes, I want to go home. It beats sitting here in the ED." Dr. Darrick Meigs made aware. Dr. Darrick Meigs will discharge this pt. Pt's blood pressure is improved and pt is ready to go home.

## 2016-01-07 NOTE — ED Notes (Signed)
Pt is eating her dinner at this time.

## 2016-01-07 NOTE — ED Notes (Signed)
Patient transported to ICU room 06 via stretcher, upon taking patient to room, she refused to get into a gown, get in bed, and be placed on ICU cardiac monitor. Pt states "I dont know why I am here, I need to be discharged home, quick." ICU staff aware.

## 2016-01-07 NOTE — ED Notes (Signed)
Assumed care of patient from [place], [place]. Pt at bedside resting quietly. No acute distress. Denies complaints. Call bell within reach. Awaiting admission to ICU 01. Breakfast served. Pt refusing certain aspects of care, BP assessment, flat affect, defensive responses. Attempted to call report to ICU 01 - assuming RN unable at this time. Assuming RN to call this RN back.

## 2016-01-07 NOTE — ED Notes (Signed)
Pt made aware that she would be in the ED until the morning. Pt stated, "well if I am just going to stay in the ED, I should just go home." Pt made aware to this nurse that I could get her her nighttime meds, a more comfortable bed, warm linens, etc. Pt said, "well, I don't want y'all to have to run your selves to death." This RN assured this pt that we were more than happy to take care of her in the ED.

## 2016-01-07 NOTE — Progress Notes (Signed)
Pt left AMA. This RN and and another discussed risks of leaving. Pt insisted. IV removed without complications.

## 2016-01-07 NOTE — ED Notes (Addendum)
Pt sitting at bedside. Pt c/o 9/10 intermittent chest pressure and a 9/10 headache. Pt refusing to take hydralazine as ordered by Dr. Darrick Meigs for a BP > 160/100. Pt does however want her morphine for her chest pain and headache. Pt will NOT leave BP cuff or pulse ox on. Pt made aware that this RN was told that this pt should have a bed between 7:30 am and 8:00 am. Pt states, "it really doesn't make a difference."

## 2016-01-07 NOTE — ED Notes (Signed)
Pt made aware that if she was going to be admitted, that she would have to have an IV for meds, etc. Pt said she would let me try and start and IV, "against her better judgement."

## 2016-01-07 NOTE — ED Notes (Signed)
Barnett Applebaum, RN [facility] [place] is going to bring this pt a ham sandwich and some baked chips due to her multiple allergies.

## 2016-01-07 NOTE — ED Notes (Addendum)
Dr. Darrick Meigs is aware that this pt is requesting to stay but she does not want her blood drawn. Dr. Darrick Meigs states, "that's fine." Pt will continue to wait for a bed upstairs.

## 2016-01-07 NOTE — ED Notes (Signed)
Pt continues to take BP cuff off and pulse ox.

## 2016-01-07 NOTE — ED Notes (Signed)
Pt requesting for this nurse to not take her dinner tray. Pt moved from her chair to the bed and was given warm blanket and a pillow. Pt requesting the lights to be turned down. Pt states "I like it dark and quiet." Pt states she would hit her call bell if she needed me.

## 2016-01-07 NOTE — H&P (Signed)
TRH H&P   Patient Demographics:    Katrina [name], is a 55 y.o. female  MRN: [id number]  DOB - February 13, 1961  Admit Date - 01/06/2016  Outpatient Primary MD for the patient is [place]  Referring MD/NP/PA: Dr. Laverta Baltimore  Patient coming from: Home  Chief Complaint  Patient presents with  . Chest Pain      HPI:    Katrina [name]  is a 55 y.o. female, with history of uncontrolled hypertension, grade 1 diastolic dysfunction, who came to the ED with  chest pain since yesterday. Patient has been intermittent, felt like pressure. She denies shortness of breath. No nausea vomiting or diarrhea. Patient had cardiac cath in August 2016 which showed clean coronary arteries. No blockages noted. In the ED patient found to be in hypertensive urgency, normal cardiac enzymes, EKG showed no significant ST-T changes.    Review of systems:    In addition to the HPI above, No Fever-chills, No Headache, No changes with Vision or hearing, No problems swallowing food or Liquids, No Abdominal pain, No Nausea or Vomiting, bowel movements are regular No Blood in stool or Urine, No dysuria, No new skin rashes or bruises, No new joints pains-aches,  + residual weakness in left upper and lower extremity. CVA 2, required TPA in April. No recent weight gain or loss, No polyuria, polydypsia or polyphagia, No significant Mental Stressors.  A full 10 point Review of Systems was done, except as stated above, all other Review of Systems were negative.   With Past History of the following :    Past Medical History:  Diagnosis Date  . Bipolar affective ([place])   . CAD (coronary artery disease)    Moderate nonobstructive 2012-2013, [place]  . COPD (chronic obstructive pulmonary disease) ([place])   . Diverticulosis   . Essential hypertension   . GERD (gastroesophageal reflux disease)   . Hyperlipidemia   .  Hypertension   . Hypothyroid   . Left-sided weakness 03/20/2014   MRI brain negative for stroke.  . Lung nodules   . 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 Type 2 diabetes mellitus ([place])       Past Surgical History:  Procedure Laterality Date  . ABDOMINAL HYSTERECTOMY    . ANKLE SURGERY    . APPENDECTOMY    . BACK SURGERY    . CARDIAC CATHETERIZATION N/A 01/31/2015   Procedure: Left Heart Cath and Coronary Angiography;  Surgeon: Burnell Blanks, MD;  Location: [facility];  Service: Cardiovascular;  Laterality: N/A;  . CHOLECYSTECTOMY    . NOSE SURGERY    . TONSILLECTOMY        Social History:     Social History  Substance Use Topics  . Smoking status: Current Every Day Smoker    Packs/day: 2.00    Years: 40.00    Types: Cigarettes    Start date: 03/04/1971  . Smokeless tobacco: Current User     Comment: Vapor Cigs  . Alcohol use No  Comment: 10-06-15 per pt no         Family History :     Family History  Problem Relation Age of Onset  . Coronary artery disease Father 20  . Emphysema Father   . Depression Mother   . Bipolar disorder Brother   . Drug abuse Brother   . Bipolar disorder Daughter       Home Medications:   Prior to Admission medications   Medication Sig Start Date End Date Taking? Authorizing Provider  amLODipine (NORVASC) 10 MG tablet Take 1 tablet (10 mg total) by mouth daily. 06/05/14  Yes Herminio Commons, MD  clopidogrel (PLAVIX) 75 MG tablet Take 75 mg by mouth daily.   Yes Historical Provider, MD  dexlansoprazole (DEXILANT) 60 MG capsule Take 1 capsule (60 mg total) by mouth every evening. 03/20/14  Yes Rexene Alberts, MD  diazepam (VALIUM) 5 MG tablet Take 5 mg by mouth at bedtime as needed for anxiety or muscle spasms.  12/10/15  Yes Historical Provider, MD  gabapentin (NEURONTIN) 600 MG tablet Take 600 mg by mouth 4 (four) times daily. 07/05/15  Yes Historical Provider, MD  rOPINIRole (REQUIP) 4 MG tablet Take 4 mg by  mouth at bedtime.   Yes Historical Provider, MD  zolpidem (AMBIEN) 10 MG tablet Take 1 tablet (10 mg total) by mouth at bedtime. 03/20/14  Yes Rexene Alberts, MD  DULoxetine (CYMBALTA) 60 MG capsule Take 1 capsule (60 mg total) by mouth daily. Patient not taking: Reported on 01/06/2016 10/06/15 10/05/16  Cloria Spring, MD  EPINEPHrine (EPI-PEN) 0.3 mg/0.3 mL DEVI Inject 0.3 mg into the muscle once as needed (allergic reaction).     Historical Provider, MD  triamterene-hydrochlorothiazide Bradd Burner) 37.5-25 MG tablet  01/01/16   Historical Provider, MD     Allergies:     Allergies  Allergen Reactions  . Penicillins Anaphylaxis    Has patient had a PCN reaction causing immediate rash, facial/tongue/throat swelling, SOB or lightheadedness with hypotension: Yes Has patient had a PCN reaction causing severe rash involving mucus membranes or skin necrosis: Yes Has patient had a PCN reaction that required hospitalization Yes Has patient had a PCN reaction occurring within the last 10 years: No If all of the above answers are "NO", then may proceed with Cephalosporin use.   Marland Kitchen Pineapple Rash  . Strawberry Extract Rash  . Zithromax [Azithromycin] Anaphylaxis  . Aspirin     D/t stomach ulcers.   . Aspartame And Phenylalanine Palpitations  . Mushroom Extract Complex Rash  . Nicardipine Nausea And Vomiting    shaking  . Poultry Meal Other (See Comments)    Allergic to [place]     Physical Exam:   Vitals  Blood pressure (!) 213/99, pulse 79, temperature 98.8 F (37.1 C), temperature source Oral, resp. rate 23, height 5\' 7"  (1.702 m), weight 95.3 kg (210 lb), SpO2 97 %.   1. General Appears in no acute distress, cooperative with exam  2. Normal affect and insight, Awake Alert, Oriented X 3.  3. No F.N deficits, ALL C.Nerves Intact, Strength 5/5 all 4 extremities, Sensation intact all 4 extremities, Plantars down going.  4. Ears and Eyes appear Normal, Conjunctivae clear, PERRLA. Moist Oral  Mucosa.  5. Supple Neck, No JVD, No cervical lymphadenopathy appriciated, No Carotid Bruits.  6. Symmetrical Chest wall movement, Good air movement bilaterally, CTAB.  7. RRR, No Gallops, Rubs or Murmurs, No Parasternal Heave.Trace bilateral lower extremity edema.  8. Positive Bowel Sounds, Abdomen Soft,  No tenderness, No organomegaly appriciated,No rebound -guarding or rigidity.  9.  No Cyanosis, Normal Skin Turgor, No Skin Rash or Bruise.  10. Good muscle tone,  joints appear normal , no effusions, Normal ROM.      Data Review:    CBC  Recent Labs Lab 01/06/16 2201  WBC 11.1*  HGB 13.3  HCT 40.8  PLT 235  MCV 90.1  MCH 29.4  MCHC 32.6  RDW 14.6   ------------------------------------------------------------------------------------------------------------------  Chemistries   Recent Labs Lab 01/06/16 2201  NA 139  K 3.5  CL 111  CO2 26  GLUCOSE 127*  BUN 16  CREATININE 0.81  CALCIUM 8.5*   ------------------------------------------------------------------------------------------------------------------  ------------------------------------------------------------------------------------------------------------------ Coagulation Profile:  Recent Labs Lab 01/06/16 2201  INR 1.03   Cardiac Enzymes:  Recent Labs Lab 01/06/16 2201  TROPONINI <0.03    --------------------------------------------------------------------------------------------------------------- Urine analysis:    Component Value Date/Time   COLORURINE YELLOW 09/08/2015 0000   APPEARANCEUR CLEAR 09/08/2015 0000   LABSPEC 1.015 09/08/2015 0000   PHURINE 6.5 09/08/2015 0000   GLUCOSEU NEGATIVE 09/08/2015 0000   HGBUR SMALL (A) 09/08/2015 0000   BILIRUBINUR NEGATIVE 09/08/2015 0000   KETONESUR NEGATIVE 09/08/2015 0000   PROTEINUR NEGATIVE 09/08/2015 0000   UROBILINOGEN 0.2 01/23/2015 2255   NITRITE NEGATIVE 09/08/2015 0000   LEUKOCYTESUR NEGATIVE 09/08/2015 0000       ----------------------------------------------------------------------------------------------------------------   Imaging Results:    Dg Chest 2 View  Result Date: 01/06/2016 CLINICAL DATA:  55 year old female with hypertension chest pain and shortness of breath. Initial encounter. Smoker. EXAM: CHEST  2 VIEW COMPARISON:  12/10/2015 and earlier. FINDINGS: Stable lung volumes. Normal cardiac size and mediastinal contours. Visualized tracheal air column is within normal limits. Mild increased interstitial markings are stable. No pneumothorax, pulmonary edema, pleural effusion or confluent pulmonary opacity. No acute osseous abnormality identified. IMPRESSION: No acute cardiopulmonary abnormality. Electronically Signed   By: Genevie Ann M.D.   On: 01/06/2016 22:49    My personal review of EKG: Rhythm NSR   Assessment & Plan:    Active Problems:   Chest pain   Hypertensive urgency   1. Chest pain- admit the patient to rule out ACS, cycle cardiac enzymes, start morphine when necessary for pain. 2. Hypertensive urgency- continue home medications amlodipine and Maxzide. Will start metoprolol 25 mg twice a day, hydralazine 25 mg by mouth every 6 hours when necessary BP greater than 160/100. 3. History of CVA- continue Plavix   DVT Prophylaxis-   Lovenox   AM Labs Ordered, also please review Full Orders  Family Communication: No family present at bedside  Code Status:  DO NOT RESUSCITATE  Admission status: Observation  Time spent in minutes : 60 min   LAMA,GAGAN S M.D on 01/07/2016 at 12:12 AM  Between 7am to 7pm - Pager - [phone]. After 7pm go to [email or web address] - password [facility]  [facility] - Office  [phone]

## 2016-01-07 NOTE — Progress Notes (Signed)
Pt came to unit from ED. RN in to assess pt and found her sitting wrapped in blanket, refusing to get in the bed. Pt also refused to be wiped with CHG wipes, have CBG checked, have temp checked, have MRSA PCR screen completed, or be monitored (HR, BP, or O2 sats). Pt states that she wants to leave. RN advised against signing out AMA. RN also educated pt about the importance of monitoring in the presence of chest pain. Pt stated that she "don't need any of this stuff." pt also states that she has never been admitted to ICU and is not going to stay here. RN explained that she was admitted to stepdown which she needs for the continuous monitoring. Pt still refusing care. MD made aware via text page.

## 2016-01-07 NOTE — ED Notes (Signed)
Dr. Darrick Meigs made aware that this pt refused her hydralazine. Dr. Darrick Meigs also made aware that this pt is c/o intermittent chest pressure and headache in which she rates her pain a 9/10. Dr. Darrick Meigs stated to give this pt her PRN morphine. Pt given 2 mg morphine per PRN order.

## 2016-01-07 NOTE — ED Notes (Addendum)
Correction-this RN spoke with Dr. Darrick Meigs again and he said that he said that he was NOT going to discharge this pt, that she would have to sign out AMA.

## 2016-01-07 NOTE — Discharge Summary (Signed)
  Physician Discharge Summary  Katrina [name] [id number] DOB: Mar 22, 1961 DOA: 01/06/2016  PCP: [place] date: 01/06/2016 Discharge date: 01/07/2016  Patient left AGAINST MEDICAL ADVICE.   Discharge Diagnoses:  1. Chest pain 2. Hypertensive urgency, accelerated hypertension, malignant hypertension 3. Coronary artery disease 4. Diabetes mellitus type 2 5. Tobacco use disorder  Discharge Condition: Stable. Patient left AGAINST MEDICAL ADVICE.   Filed Weights   01/06/16 2013  Weight: 95.3 kg (210 lb)    History of present illness:  55 year old woman with malignant hypertension presented with chest pain, admitted for chest pain and hypertensive urgency.  Hospital Course:  Patient was noncompliant with treatment recommendations, refused BP cuff, pulse ox monitoring. Refuses blood draws, refused medication for blood pressure. Refuse to put in a gown or we are cardiac monitor. As blood work was refused, no further evaluation could be conducted. She decided to leave [facility] and was noted to be by nursing to have capacity. She left prior to evaluation by this physician.   Allergies  Allergen Reactions  . Penicillins Anaphylaxis    Has patient had a PCN reaction causing immediate rash, facial/tongue/throat swelling, SOB or lightheadedness with hypotension: Yes Has patient had a PCN reaction causing severe rash involving mucus membranes or skin necrosis: Yes Has patient had a PCN reaction that required hospitalization Yes Has patient had a PCN reaction occurring within the last 10 years: No If all of the above answers are "NO", then may proceed with Cephalosporin use.   Marland Kitchen Pineapple Rash  . Strawberry Extract Rash  . Zithromax [Azithromycin] Anaphylaxis  . Aspirin     D/t stomach ulcers.   . Aspartame And Phenylalanine Palpitations  . Mushroom Extract Complex Rash  . Nicardipine Nausea And Vomiting    shaking  . Poultry Meal Other (See  Comments)    Allergic to [place]    The results of significant diagnostics from this hospitalization (including imaging, microbiology, ancillary and laboratory) are listed below for reference.    Significant Diagnostic Studies: Dg Chest 2 View  Result Date: 01/06/2016 CLINICAL DATA:  55 year old female with hypertension chest pain and shortness of breath. Initial encounter. Smoker. EXAM: CHEST  2 VIEW COMPARISON:  12/10/2015 and earlier. FINDINGS: Stable lung volumes. Normal cardiac size and mediastinal contours. Visualized tracheal air column is within normal limits. Mild increased interstitial markings are stable. No pneumothorax, pulmonary edema, pleural effusion or confluent pulmonary opacity. No acute osseous abnormality identified. IMPRESSION: No acute cardiopulmonary abnormality. Electronically Signed   By: Genevie Ann M.D.   On: 01/06/2016 22:49   Labs: Basic Metabolic Panel:  Recent Labs Lab 01/06/16 2201  NA 139  K 3.5  CL 111  CO2 26  GLUCOSE 127*  BUN 16  CREATININE 0.81  CALCIUM 8.5*   CBC:  Recent Labs Lab 01/06/16 2201  WBC 11.1*  HGB 13.3  HCT 40.8  MCV 90.1  PLT 235   Cardiac Enzymes:  Recent Labs Lab 01/06/16 2201  TROPONINI <0.03    Active Problems:   Chest pain   Hypertensive urgency   Time coordinating discharge: 10 minutes  Signed:  Murray Hodgkins, MD [facility] 01/07/2016, 6:26 PM

## 2016-01-12 NOTE — Progress Notes (Signed)
Left message with Angela Nevin at Dr.Dumonski's office requesting H&P.

## 2016-01-12 NOTE — Pre-Procedure Instructions (Signed)
    Nelli Larin  01/12/2016      [id number] - [place], [place] - [address] AT [place]. [address] [address] Phone: [phone] Fax: [phone]    Your procedure is scheduled on Thurs, Aug 10 @ 10:00 AM  Report to [facility] Admitting at 8:00 AM  Call this number if you have problems the morning of surgery:  [phone]   Remember:  Do not eat food or drink liquids after midnight.  Take these medicines the morning of surgery with A SIP OF WATER Amlodipine(Norvasc),Dexilant(Dexlansoprazole),and Gabapentin(Neurontin)             No Goody's,BC's,Aleve,Advil,Motrin,Ibuprofen,Fish Oil,or any Herbal Medications.                Do not wear jewelry, make-up or nail polish.  Do not wear lotions, powders, or perfumes.    Do not shave 48 hours prior to surgery.    Do not bring valuables to the hospital.  [facility] is not responsible for any belongings or valuables.  Contacts, dentures or bridgework may not be worn into surgery.  Leave your suitcase in the car.  After surgery it may be brought to your room.  For patients admitted to the hospital, discharge time will be determined by your treatment team.  Patients discharged the day of surgery will not be allowed to drive home.

## 2016-01-13 ENCOUNTER — Encounter (HOSPITAL_COMMUNITY)
Admission: RE | Admit: 2016-01-13 | Discharge: 2016-01-13 | Disposition: A | Discharge status: Home / Self Care | Payer: Medicare Other | Source: Ambulatory Visit | Attending: Orthopedic Surgery | Admitting: Orthopedic Surgery

## 2016-01-13 ENCOUNTER — Encounter (HOSPITAL_COMMUNITY): Payer: Self-pay

## 2016-01-13 DIAGNOSIS — F17200 Nicotine dependence, unspecified, uncomplicated: Secondary | ICD-10-CM | POA: Diagnosis not present

## 2016-01-13 DIAGNOSIS — J449 Chronic obstructive pulmonary disease, unspecified: Secondary | ICD-10-CM | POA: Diagnosis not present

## 2016-01-13 DIAGNOSIS — I10 Essential (primary) hypertension: Secondary | ICD-10-CM | POA: Diagnosis not present

## 2016-01-13 DIAGNOSIS — F40240 Claustrophobia: Secondary | ICD-10-CM | POA: Diagnosis not present

## 2016-01-13 DIAGNOSIS — K219 Gastro-esophageal reflux disease without esophagitis: Secondary | ICD-10-CM | POA: Diagnosis not present

## 2016-01-13 DIAGNOSIS — G8929 Other chronic pain: Secondary | ICD-10-CM | POA: Diagnosis not present

## 2016-01-13 DIAGNOSIS — E119 Type 2 diabetes mellitus without complications: Secondary | ICD-10-CM | POA: Diagnosis not present

## 2016-01-13 DIAGNOSIS — M5116 Intervertebral disc disorders with radiculopathy, lumbar region: Secondary | ICD-10-CM | POA: Diagnosis not present

## 2016-01-13 DIAGNOSIS — E785 Hyperlipidemia, unspecified: Secondary | ICD-10-CM | POA: Diagnosis not present

## 2016-01-13 HISTORY — DX: Anxiety disorder, unspecified: F41.9

## 2016-01-13 HISTORY — DX: Personal history of other diseases of the digestive system: Z87.19

## 2016-01-13 HISTORY — DX: Insomnia, unspecified: G47.00

## 2016-01-13 HISTORY — DX: Personal history of Methicillin resistant Staphylococcus aureus infection: Z86.14

## 2016-01-13 HISTORY — DX: Personal history of other medical treatment: Z92.89

## 2016-01-13 HISTORY — DX: Restless legs syndrome: G25.81

## 2016-01-13 HISTORY — DX: Diverticulitis of intestine, part unspecified, without perforation or abscess without bleeding: K57.92

## 2016-01-13 HISTORY — DX: Cerebral infarction, unspecified: I63.9

## 2016-01-13 HISTORY — DX: Multiple sclerosis: G35

## 2016-01-13 HISTORY — DX: Personal history of colon polyps, unspecified: Z86.0100

## 2016-01-13 HISTORY — DX: Unspecified asthma, uncomplicated: J45.909

## 2016-01-13 HISTORY — DX: Personal history of peptic ulcer disease: Z87.11

## 2016-01-13 HISTORY — DX: Personal history of colonic polyps: Z86.010

## 2016-01-13 HISTORY — DX: Family history of other specified conditions: Z84.89

## 2016-01-13 HISTORY — DX: Polyneuropathy, unspecified: G62.9

## 2016-01-13 LAB — BASIC METABOLIC PANEL
ANION GAP: 5 (ref 5–15)
BUN: 14 mg/dL (ref 6–20)
CHLORIDE: 112 mmol/L — AB (ref 101–111)
CO2: 22 mmol/L (ref 22–32)
Calcium: 9.1 mg/dL (ref 8.9–10.3)
Creatinine, Ser: 0.77 mg/dL (ref 0.44–1.00)
Glucose, Bld: 93 mg/dL (ref 65–99)
POTASSIUM: 3.9 mmol/L (ref 3.5–5.1)
SODIUM: 139 mmol/L (ref 135–145)

## 2016-01-13 LAB — CBC
HEMATOCRIT: 42.1 % (ref 36.0–46.0)
HEMOGLOBIN: 13.4 g/dL (ref 12.0–15.0)
MCH: 29.3 pg (ref 26.0–34.0)
MCHC: 31.8 g/dL (ref 30.0–36.0)
MCV: 91.9 fL (ref 78.0–100.0)
Platelets: 243 10*3/uL (ref 150–400)
RBC: 4.58 MIL/uL (ref 3.87–5.11)
RDW: 14.8 % (ref 11.5–15.5)
WBC: 7.6 10*3/uL (ref 4.0–10.5)

## 2016-01-13 LAB — GLUCOSE, CAPILLARY: Glucose-Capillary: 106 mg/dL — ABNORMAL HIGH (ref 65–99)

## 2016-01-13 NOTE — Progress Notes (Addendum)
Cardiologist is [place] visit in Sept 2016  Medical Md is with [facility] and sees NP [name]  Echo reports in epic from 2013/2015/2016  Stress test reports in epic from 2013/2015  Heart cath report in epic from 2016  EKG in epic from 01-06-16  CXR report in epic from 01-2016

## 2016-01-14 NOTE — Anesthesia Preprocedure Evaluation (Addendum)
Anesthesia Evaluation  Patient identified by MRN, date of birth, ID band  Reviewed: Allergy & Precautions, NPO status , Patient's Chart, lab work & pertinent test results  History of Anesthesia Complications (+) Family history of anesthesia reactionNegative for: history of anesthetic complications  Airway Mallampati: II  TM Distance: >3 FB Neck ROM: Full    Dental  (+) Upper Dentures, Lower Dentures   Pulmonary neg shortness of breath, asthma (doesn't use inhalers) , COPD, neg recent URI, Current Smoker,  Lung nodules   Pulmonary exam normal breath sounds clear to auscultation       Cardiovascular hypertension (poorly controlled), Pt. on medications + angina + CAD  (-) Past MI, (-) Cardiac Stents, (-) CABG, (-) Orthopnea and (-) PND  Rhythm:Regular Rate:Normal  LHC 01/31/2015: Conclusion  The left ventricular systolic function is normal. 1. No angiographic evidence of CAD 2. Normal LV systolic function 3. Uncontrolled HTN  TTE 12/25/2014: ------------------------------------------------------------------- Study Conclusions - Left ventricle: The cavity size was normal. There was mild focal   basal and mild concentric hypertrophy of the septum. Systolic   function was vigorous. The estimated ejection fraction was in the   range of 65% to 70%. Wall motion was normal; there were no   regional wall motion abnormalities. Doppler parameters are   consistent with abnormal left ventricular relaxation (grade 1   diastolic dysfunction). Doppler parameters are consistent with   elevated ventricular end-diastolic filling pressure. - Aortic valve: Trileaflet; normal thickness leaflets. There was no   regurgitation. - Aortic root: The aortic root was normal in size. - Mitral valve: Structurally normal valve. - Left atrium: The atrium was normal in size. - Right ventricle: The cavity size was normal. Wall thickness was   normal. Systolic  function was normal. - Right atrium: The atrium was normal in size. - Tricuspid valve: There was mild regurgitation. - Pulmonic valve: There was no regurgitation. - Pulmonary arteries: Systolic pressure was within the normal   range. - Inferior vena cava: The vessel was normal in size. - Pericardium, extracardiac: There was no pericardial effusion.     Neuro/Psych  Headaches, neg Seizures (secondary to alcohol withdrawal 1.5 years ago) PSYCHIATRIC DISORDERS Anxiety PTSDPeripheral neuropathy, multiple sclerosis, RLS, chronic back pain CVA (left-sided weakness), Residual Symptoms    GI/Hepatic Neg liver ROS, PUD, GERD  Medicated,diverticulitis   Endo/Other  diabetes, Type 2  Renal/GU negative Renal ROS     Musculoskeletal   Abdominal (+) + obese,   Peds  Hematology negative hematology ROS (+)   Anesthesia Other Findings Insomnia, HLD  Patient presented to ED on 01/07/2016 with chest pain and hypertensive urgency. She was admitted to stepdown and left AMA after refusing blood work and monitoring. Initial troponins were negative and EKG was unremarkable. LHC from 12/2014 showed no CAD. Patient denies current chest pain and vision changes. She states she has a minor headache that is always present. She took her amlodipine this morning but does not wear her clonidine patch.  Reproductive/Obstetrics                       Anesthesia Physical Anesthesia Plan  ASA: III  Anesthesia Plan: General   Post-op Pain Management:    Induction: Intravenous  Airway Management Planned: LMA  Additional Equipment:   Intra-op Plan:   Post-operative Plan: Extubation in OR  Informed Consent: I have reviewed the patients History and Physical, chart, labs and discussed the procedure including the risks, benefits and alternatives for  the proposed anesthesia with the patient or authorized representative who has indicated his/her understanding and acceptance.   Dental  advisory given  Plan Discussed with:   Anesthesia Plan Comments: (Patient lives with MAP ~120 and has had 2 previous strokes. Will plan to keep MAPs >=95 to maintain adequate cerebral perfusion pressure.)      Anesthesia Quick Evaluation

## 2016-01-15 ENCOUNTER — Encounter (HOSPITAL_COMMUNITY)
Admission: RE | Disposition: A | Discharge status: Home / Self Care | Payer: Self-pay | Source: Ambulatory Visit | Attending: Orthopedic Surgery

## 2016-01-15 ENCOUNTER — Ambulatory Visit (HOSPITAL_COMMUNITY)
Admission: RE | Admit: 2016-01-15 | Discharge: 2016-01-15 | Disposition: A | Discharge status: Home / Self Care | Payer: Medicare Other | Source: Ambulatory Visit | Attending: Orthopedic Surgery | Admitting: Orthopedic Surgery

## 2016-01-15 ENCOUNTER — Ambulatory Visit (HOSPITAL_COMMUNITY): Payer: Medicare Other | Admitting: Anesthesiology

## 2016-01-15 ENCOUNTER — Encounter (HOSPITAL_COMMUNITY): Payer: Self-pay | Admitting: *Deleted

## 2016-01-15 DIAGNOSIS — M5116 Intervertebral disc disorders with radiculopathy, lumbar region: Secondary | ICD-10-CM | POA: Diagnosis not present

## 2016-01-15 DIAGNOSIS — E119 Type 2 diabetes mellitus without complications: Secondary | ICD-10-CM | POA: Insufficient documentation

## 2016-01-15 DIAGNOSIS — J449 Chronic obstructive pulmonary disease, unspecified: Secondary | ICD-10-CM | POA: Insufficient documentation

## 2016-01-15 DIAGNOSIS — K219 Gastro-esophageal reflux disease without esophagitis: Secondary | ICD-10-CM | POA: Insufficient documentation

## 2016-01-15 DIAGNOSIS — G8929 Other chronic pain: Secondary | ICD-10-CM | POA: Insufficient documentation

## 2016-01-15 DIAGNOSIS — F17200 Nicotine dependence, unspecified, uncomplicated: Secondary | ICD-10-CM | POA: Diagnosis not present

## 2016-01-15 DIAGNOSIS — F40240 Claustrophobia: Secondary | ICD-10-CM | POA: Insufficient documentation

## 2016-01-15 DIAGNOSIS — M5416 Radiculopathy, lumbar region: Secondary | ICD-10-CM

## 2016-01-15 DIAGNOSIS — E785 Hyperlipidemia, unspecified: Secondary | ICD-10-CM | POA: Insufficient documentation

## 2016-01-15 DIAGNOSIS — I10 Essential (primary) hypertension: Secondary | ICD-10-CM | POA: Insufficient documentation

## 2016-01-15 HISTORY — PX: RADIOLOGY WITH ANESTHESIA: SHX6223

## 2016-01-15 SURGERY — RADIOLOGY WITH ANESTHESIA
Anesthesia: General

## 2016-01-15 MED ORDER — ACETAMINOPHEN 325 MG PO TABS
650.0000 mg | ORAL_TABLET | Freq: Once | ORAL | Status: AC
  Administered 2016-01-15: 650 mg via ORAL

## 2016-01-15 MED ORDER — LACTATED RINGERS IV SOLN
INTRAVENOUS | Status: DC
  Administered 2016-01-15: 08:00:00 via INTRAVENOUS

## 2016-01-15 MED ORDER — PROMETHAZINE HCL 25 MG/ML IJ SOLN: 6.2500 mg | INTRAMUSCULAR | Status: DC | PRN

## 2016-01-15 MED ORDER — LABETALOL HCL 5 MG/ML IV SOLN: 5.0000 mg | INTRAVENOUS | Status: DC | PRN

## 2016-01-15 MED ORDER — ACETAMINOPHEN 325 MG PO TABS
ORAL_TABLET | ORAL | Status: AC
  Filled 2016-01-15: qty 2

## 2016-01-15 NOTE — Transfer of Care (Signed)
Immediate Anesthesia Transfer of Care Note  Patient: Katrina [name]  Procedure(s) Performed: Procedure(s): MRI LUMBAR SPINE WITHOUT (N/A)  Patient Location: PACU  Anesthesia Type:General  Level of Consciousness: awake, alert  and oriented  Airway & Oxygen Therapy: Patient Spontanous Breathing and Patient connected to nasal cannula oxygen  Post-op Assessment: Report given to RN and Post -op Vital signs reviewed and stable  Post vital signs: Reviewed and stable  Last Vitals:  Vitals:   01/15/16 0814 01/15/16 1208  BP: (!) 198/83 (!) 155/73  Pulse:    Resp:    Temp:      Last Pain:  Vitals:   01/15/16 0803  TempSrc:   PainSc: 9          Complications: No apparent anesthesia complications

## 2016-01-15 NOTE — Anesthesia Postprocedure Evaluation (Signed)
Anesthesia Post Note  Patient: Katrina [name]  Procedure(s) Performed: Procedure(s) (LRB): MRI LUMBAR SPINE WITHOUT (N/A)  Patient location during evaluation: PACU Anesthesia Type: General Level of consciousness: awake and alert Pain management: pain level controlled Vital Signs Assessment: post-procedure vital signs reviewed and stable Respiratory status: spontaneous breathing, nonlabored ventilation, respiratory function stable and patient connected to nasal cannula oxygen Cardiovascular status: blood pressure returned to baseline and stable Postop Assessment: no signs of nausea or vomiting Anesthetic complications: no    Last Vitals:  Vitals:   01/15/16 1235 01/15/16 1248  BP: (!) 154/81 (!) 156/87  Pulse: 77 74  Resp: (!) 23 20  Temp:      Last Pain:  Vitals:   01/15/16 1215  TempSrc:   PainSc: [place] Aq[facility] [place]

## 2016-01-16 ENCOUNTER — Encounter (HOSPITAL_COMMUNITY): Payer: Self-pay | Admitting: Radiology

## 2016-01-16 MED FILL — Propofol IV Emul 200 MG/20ML (10 MG/ML): INTRAVENOUS | Qty: 20 | Status: AC

## 2016-01-16 MED FILL — Midazolam HCl Inj 2 MG/2ML (Base Equivalent): INTRAMUSCULAR | Qty: 2 | Status: AC

## 2016-01-16 MED FILL — Lactated Ringer's Solution: INTRAVENOUS | Qty: 1000 | Status: AC

## 2016-01-16 MED FILL — Phenylephrine-NaCl Pref Syr 0.4 MG/10ML-0.9% (40 MCG/ML): INTRAVENOUS | Qty: 10 | Status: AC

## 2016-01-16 MED FILL — Ephedrine Sulf-NaCl Soln Pref Syr 50 MG/10ML-0.9% (5 MG/ML): INTRAVENOUS | Qty: 10 | Status: AC

## 2016-01-16 MED FILL — Ondansetron HCl Inj 4 MG/2ML (2 MG/ML): INTRAMUSCULAR | Qty: 2 | Status: AC

## 2016-01-16 MED FILL — Fentanyl Citrate Preservative Free (PF) Inj 100 MCG/2ML: INTRAMUSCULAR | Qty: 2 | Status: AC

## 2016-01-16 MED FILL — Lidocaine HCl IV Inj 20 MG/ML: INTRAVENOUS | Qty: 5 | Status: AC

## 2016-01-31 IMAGING — CR DG CHEST 1V PORT
1 series · 1 of 1 positions shown · non-contrast
Comparison: 04/15/2014

CLINICAL DATA: Chest pain.  Hypertension.  COPD.

EXAM:
PORTABLE CHEST - 1 VIEW

[ap portable]
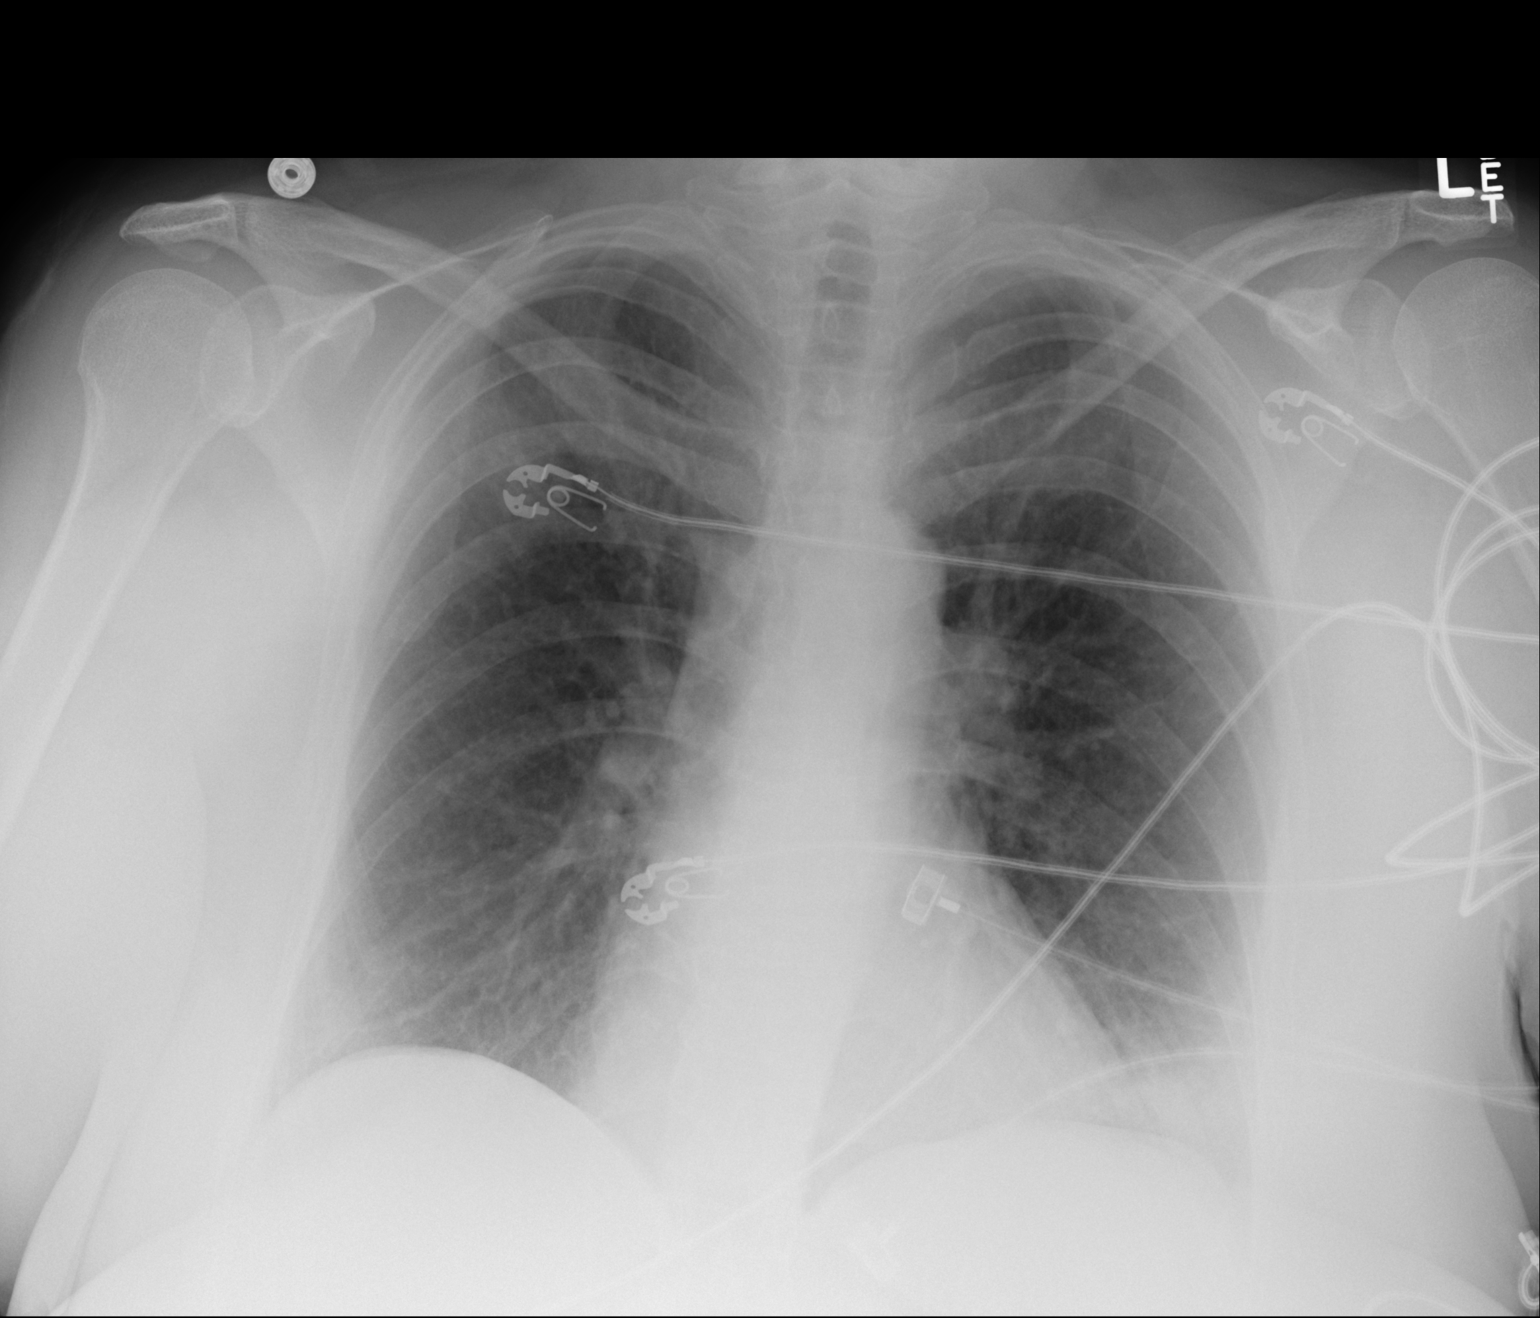

[1 of 1 positions shown; findings below may reference images not displayed]

FINDINGS: Vague density at the left lung base is thought to be due to soft
tissues of the left chest wall rather than a left lower lobe
opacity. There is no silhouetting of the hemidiaphragm, or cardiac
border.

Accordingly, the lungs are thought to be clear. Cardiac and
mediastinal margins appear normal.
IMPRESSION: 1.  No significant abnormality identified.

## 2016-01-31 IMAGING — CT CT HEAD W/O CM
1 series · 15 of 30 positions shown, 19 images · non-contrast
Comparison: CT of the head March 18, 2014 and MRI of the head
[DATE] and 14, 1745.

CLINICAL DATA: Code stroke, chest pain for 2 days, shortness of
breath, nausea and vomiting. LEFT-sided weakness for 1 hr.

EXAM:
CT HEAD WITHOUT CONTRAST
TECHNIQUE: Contiguous axial images were obtained from the base of the skull
through the vertex without intravenous contrast.

[Series 2: headseq 4.8 h37s · axial · 0.43mm/px · z∈[+176,+306]mm · 15 of 30 slices shown, 19 images]
[im 2/30  brain]
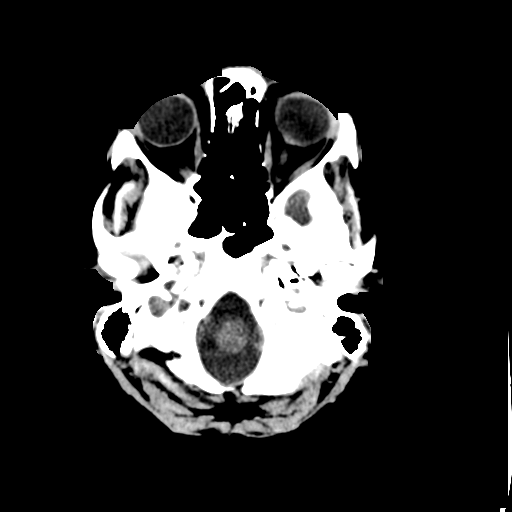
[im 2/30  bone]
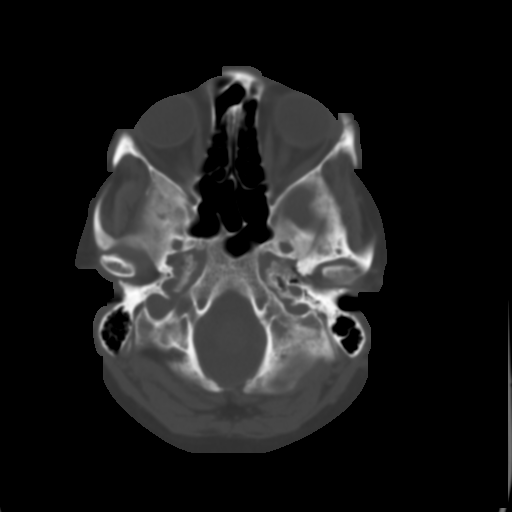
[im 4/30  brain]
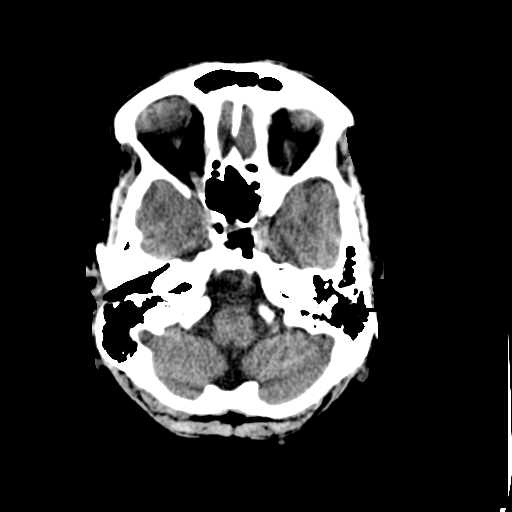
[im 6/30  brain]
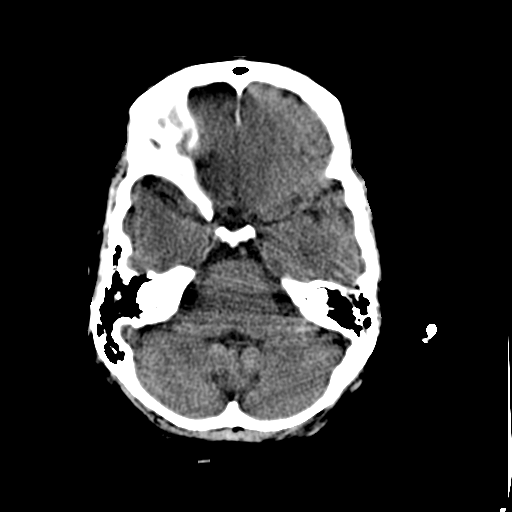
[im 8/30  brain]
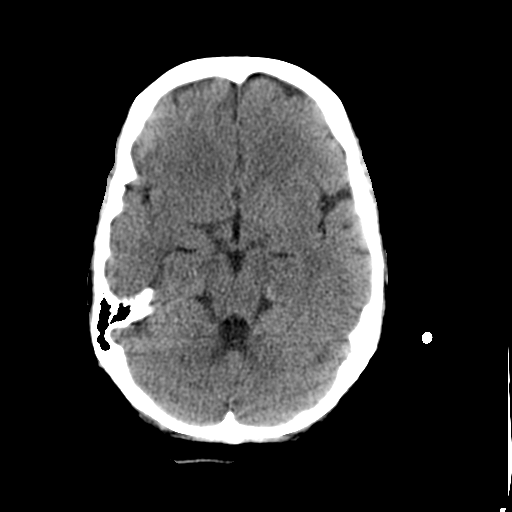
[im 10/30  brain]
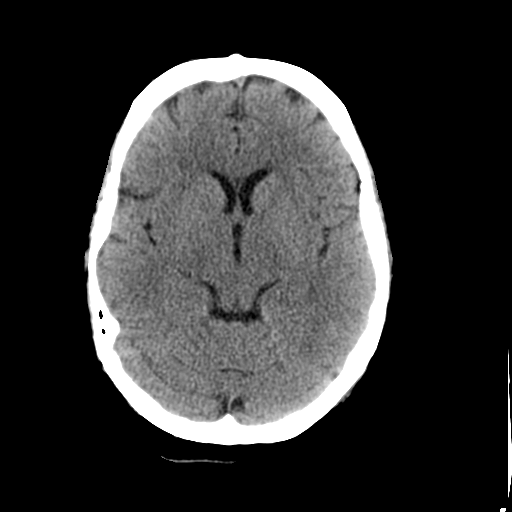
[im 10/30  bone]
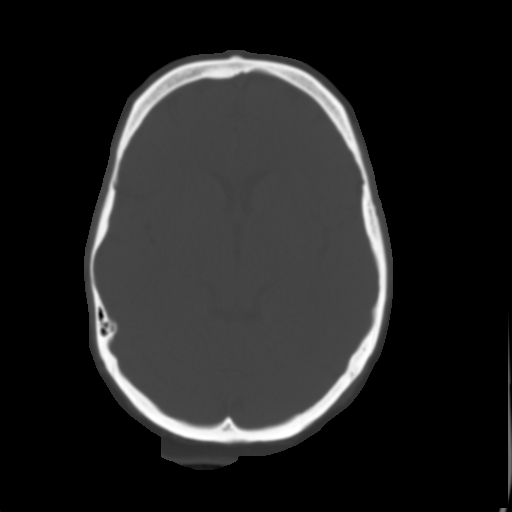
[im 12/30  brain]
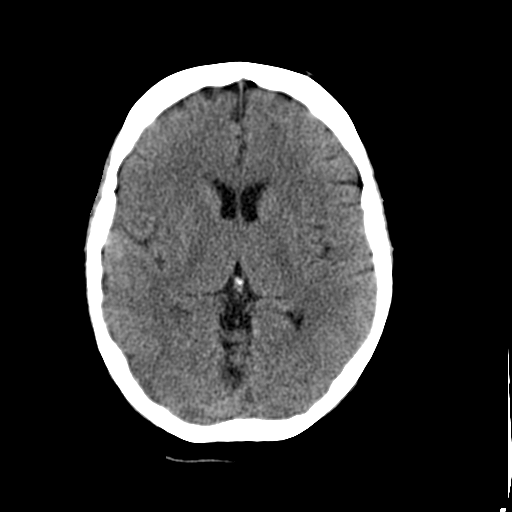
[im 14/30  brain]
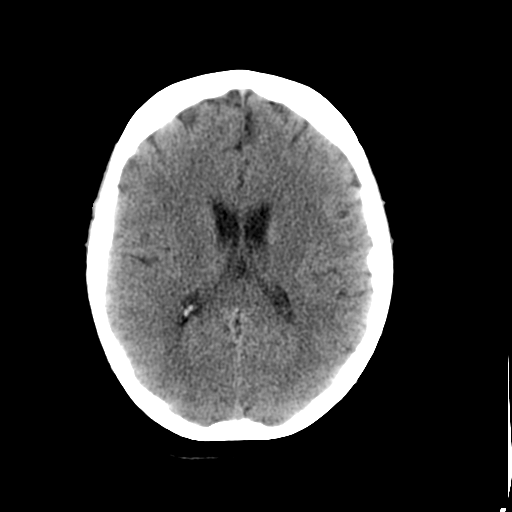
[im 16/30  brain]
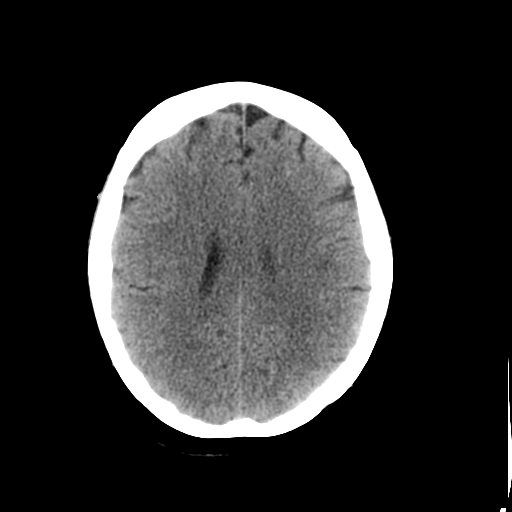
[im 17/30  brain]
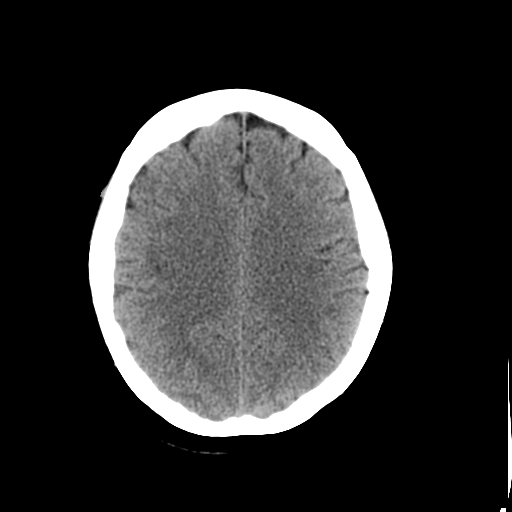
[im 17/30  bone]
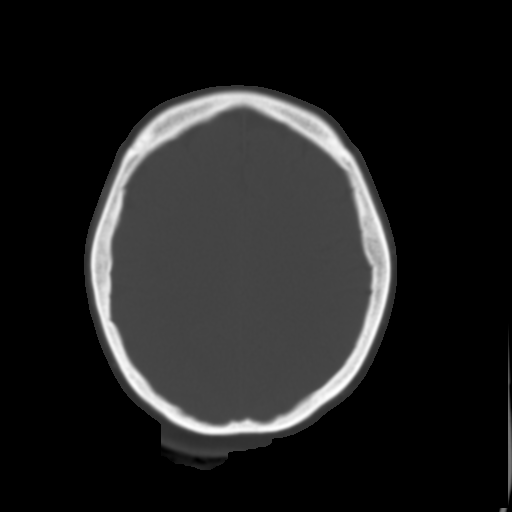
[im 19/30  brain]
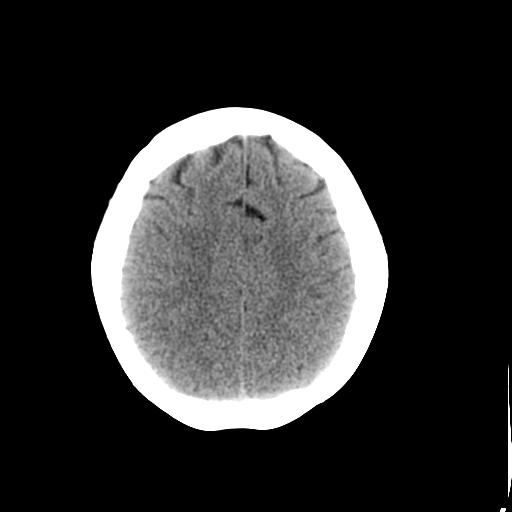
[im 21/30  brain]
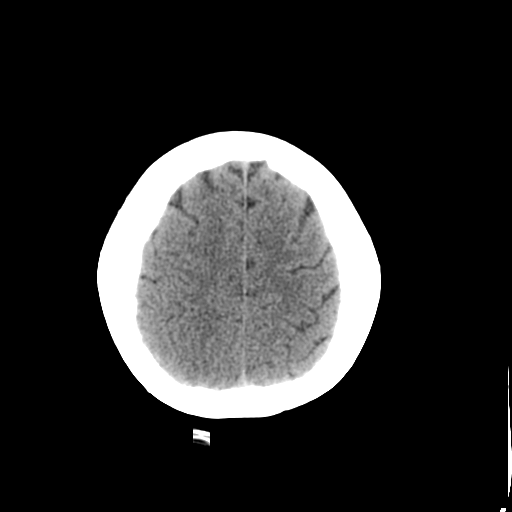
[im 23/30  brain]
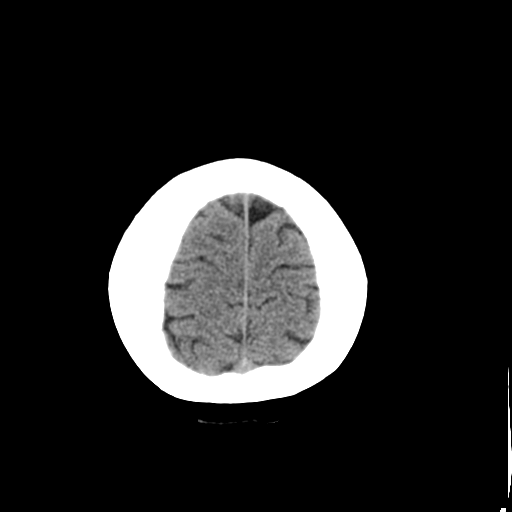
[im 25/30  brain]
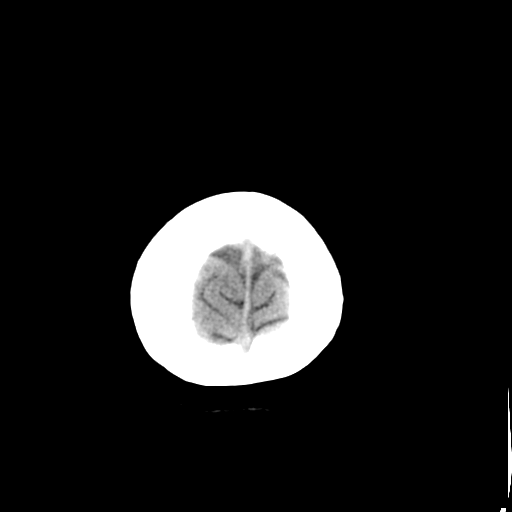
[im 25/30  bone]
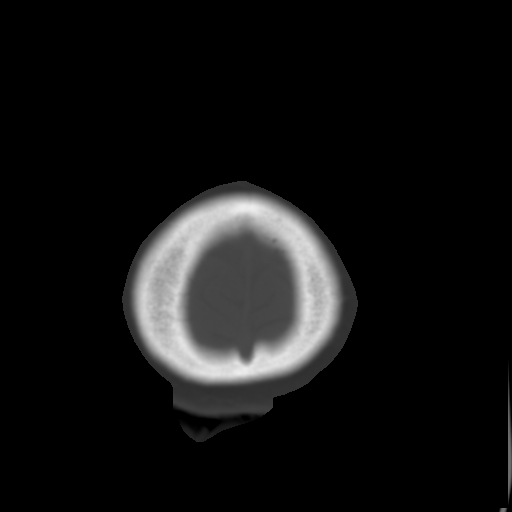
[im 27/30  brain]
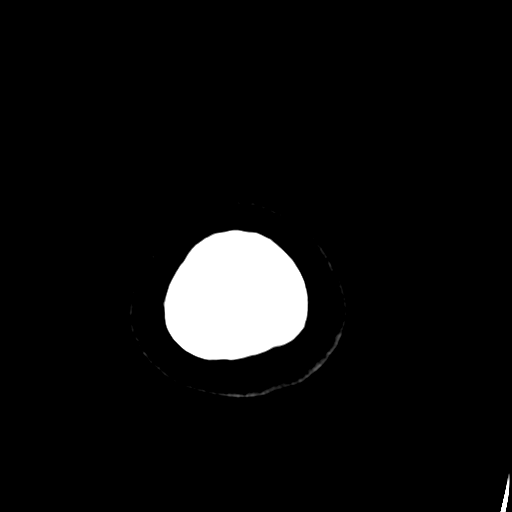
[im 29/30  brain]
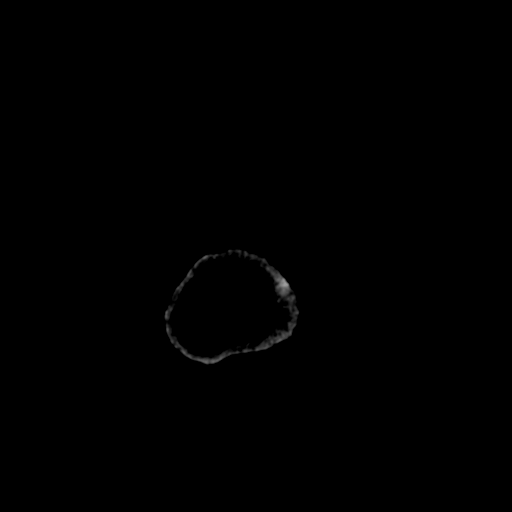

[15 of 30 positions shown; findings below may reference images not displayed]

FINDINGS: The ventricles and sulci are normal. No intraparenchymal hemorrhage,
mass effect nor midline shift. No acute large vascular territory
infarcts. Mild white matter changes are similar.

No abnormal extra-axial fluid collections. Basal cisterns are
patent.

No skull fracture. The included ocular globes and orbital contents
are non-suspicious. The mastoid aircells and included paranasal
sinuses are well-aerated.
IMPRESSION: No acute intracranial process. If ongoing concern for acute
ischemia, MRI of the brain with diffusion-weighted sequences would
be more sensitive.

Mild nonspecific white matter changes were better characterized on
the recent MRI of the brain.

Acute findings discussed with and reconfirmed by Dr.ZARMENA ES HAQ
on 05/29/2014 at [DATE].

  By: Azhar Aii

## 2016-02-01 IMAGING — MR MR HEAD W/O CM
10 series · 41 of 48 positions shown · non-contrast
Comparison: 05/29/2014 head CT, 03/19/2014 and 03/20/2014 brain MR.

CLINICAL DATA: 53-year-old diabetic hypertensive female with
history of hyperlipidemia presenting with left-sided weakness.
Subsequent encounter.

EXAM:
MRI HEAD WITHOUT CONTRAST
TECHNIQUE: Multiplanar, multiecho pulse sequences of the brain and surrounding
structures were obtained without intravenous contrast.

[Series 5: T1 · sagittal · 5.0mm · 0.80mm/px · 2 of 19 slices shown (1 of 2)]
[im 1/19]
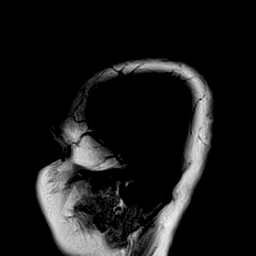
[im 19/19]
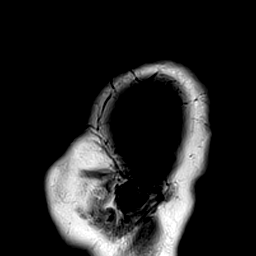

[Series 6: T2 · axial · 5.0mm · 0.65mm/px · z∈[-47,+96]mm · 3 of 23 slices shown (1 of 2)]
[im 1/23]
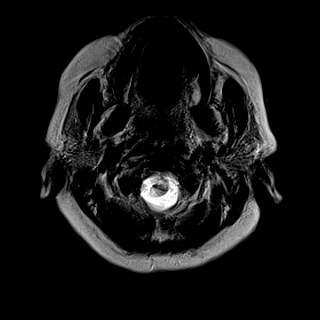
[im 12/23]
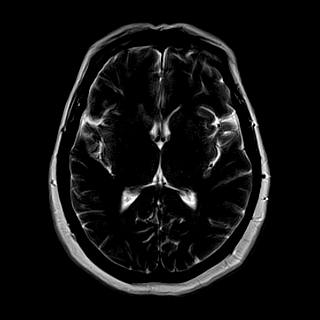
[im 23/23]
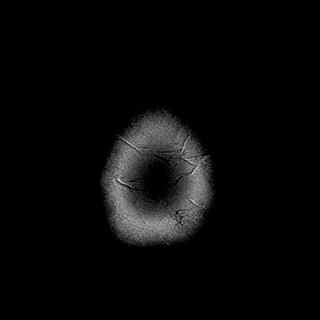

[Series 7: FLAIR · axial · 5.0mm · 0.80mm/px · z∈[-46,+96]mm · 3 of 23 slices shown]
[im 1/23]
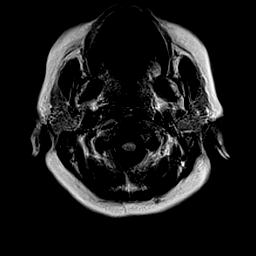
[im 12/23]
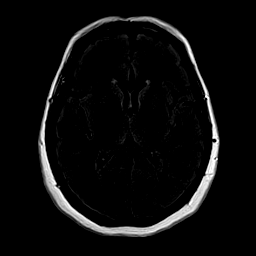
[im 23/23]
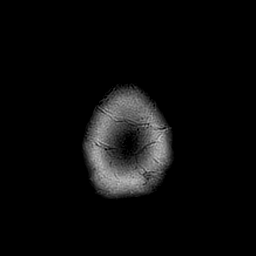

[Series 8: T1 · axial · 2.0mm · 0.41mm/px · z∈[-55,+92]mm · 8 of 75 slices shown (2 of 2)]
[im 1/75]
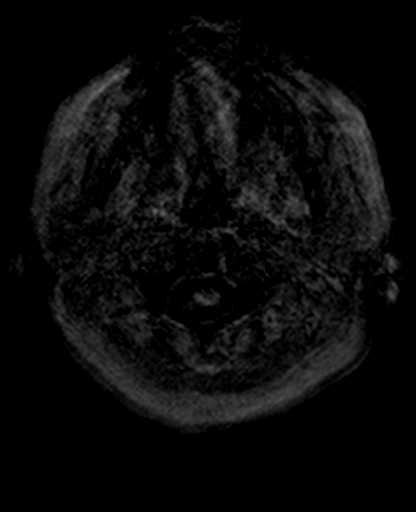
[im 10/75]
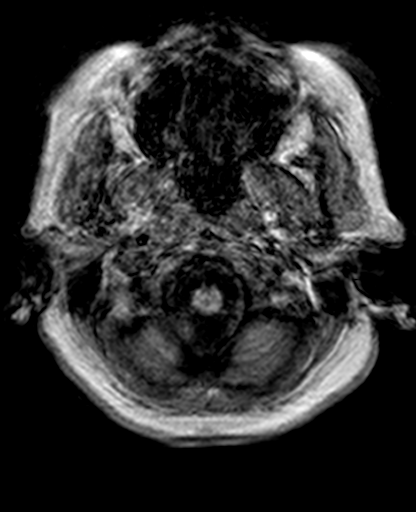
[im 19/75]
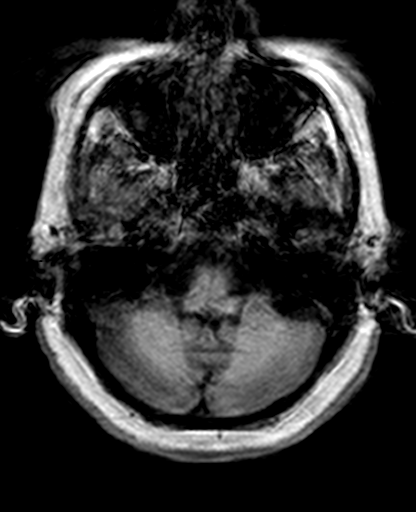
[im 28/75]
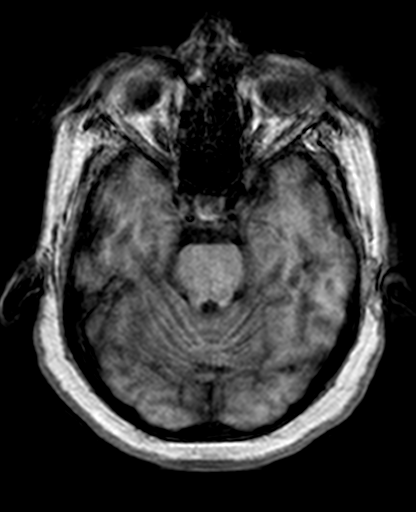
[im 47/75]
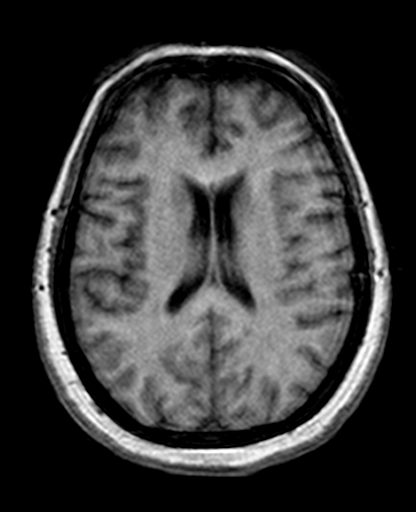
[im 56/75]
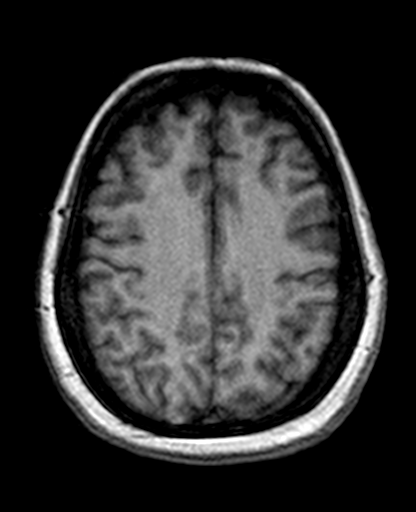
[im 65/75]
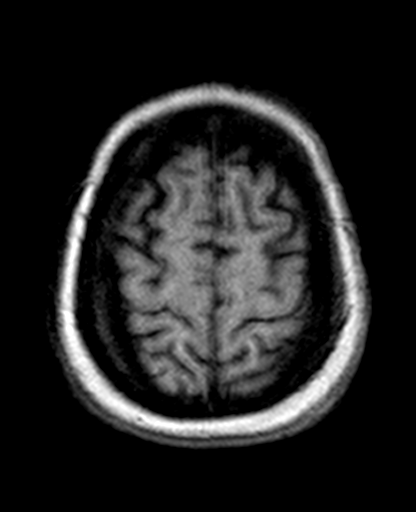
[im 75/75]
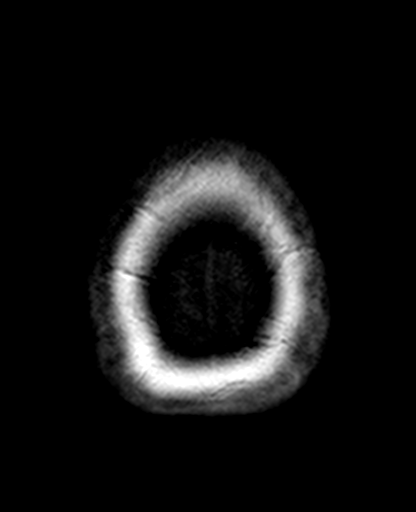

[Series 9: trauma axial · axial · 5.0mm · 0.35mm/px · z∈[-44,+98]mm · 3 of 23 slices shown]
[im 1/23]
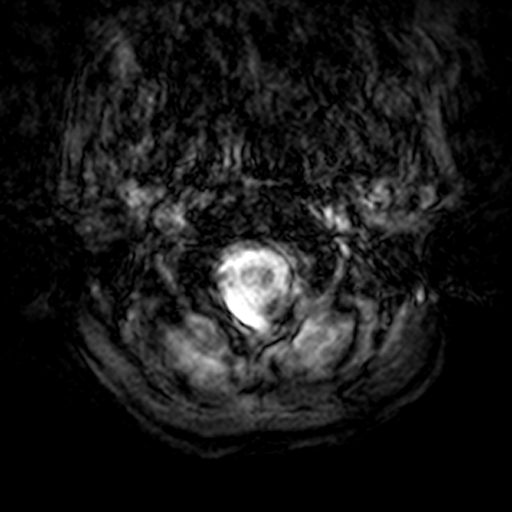
[im 12/23]
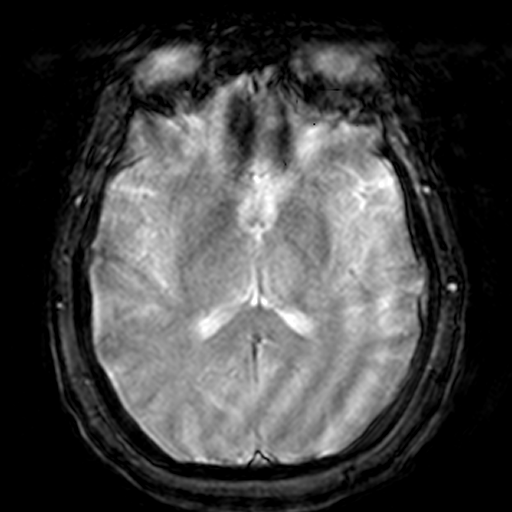
[im 23/23]
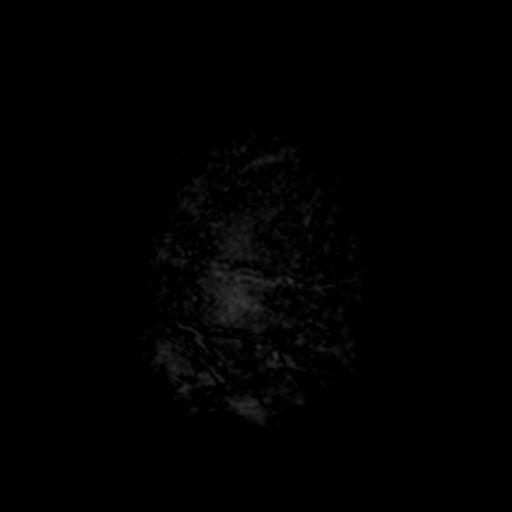

[Series 10: T2 · coronal · 5.0mm · 0.63mm/px · 3 of 24 slices shown (2 of 2)]
[im 1/24]
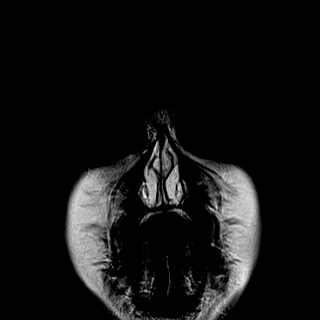
[im 12/24]
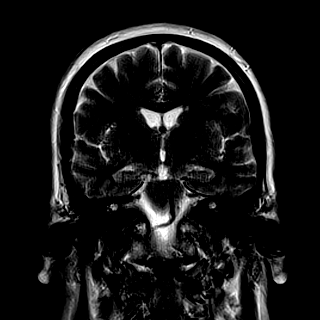
[im 24/24]
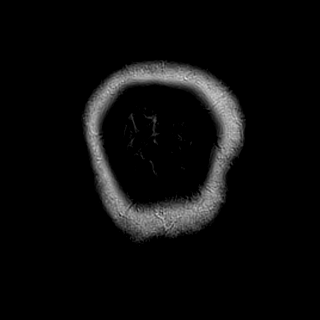

[Series 100: <mpr thick range> · axial · 3.0mm · 0.76mm/px · z∈[-42,+96]mm · 6 of 47 slices shown]
[im 1/47]
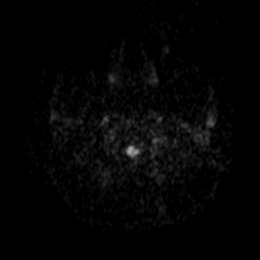
[im 10/47]
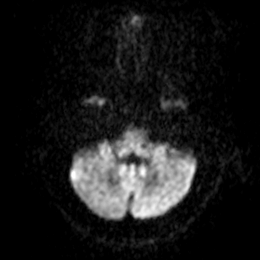
[im 19/47]
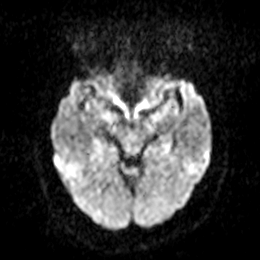
[im 28/47]
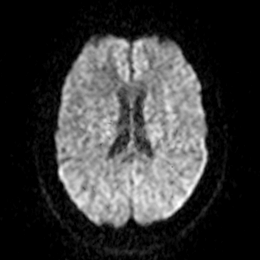
[im 37/47]
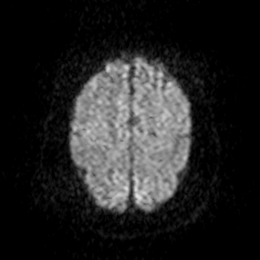
[im 47/47]
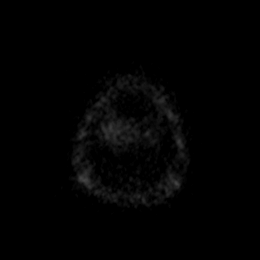

[Series 101: <mpr thick range(1)> · coronal · 3.0mm · 0.68mm/px · 6 of 52 slices shown]
[im 1/52]
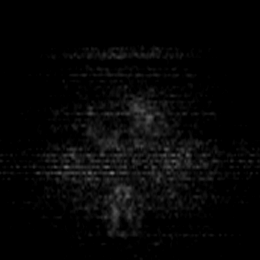
[im 11/52]
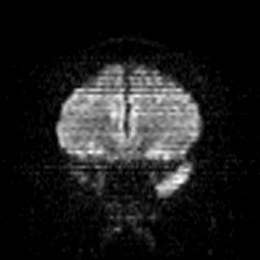
[im 21/52]
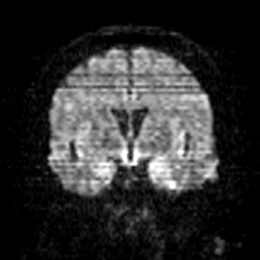
[im 31/52]
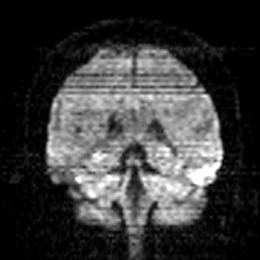
[im 41/52]
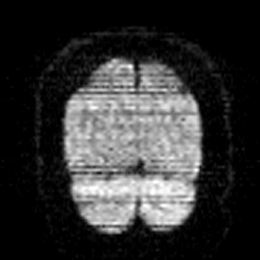
[im 52/52]
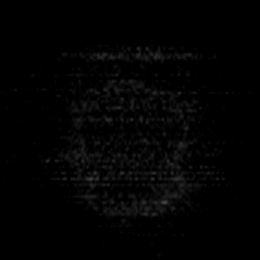

[Series 102: <mpr thick range(2)> · axial · 3.0mm · 0.76mm/px · z∈[-42,+96]mm · 6 of 47 slices shown]
[im 1/47]
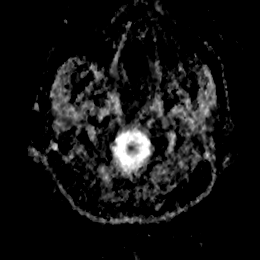
[im 10/47]
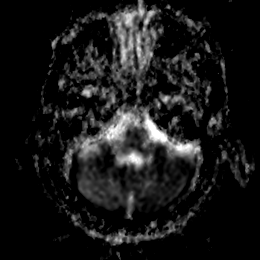
[im 19/47]
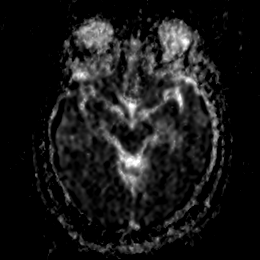
[im 28/47]
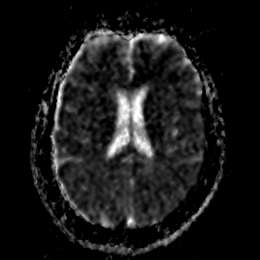
[im 37/47]
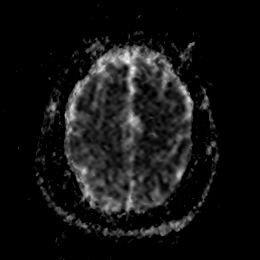
[im 47/47]
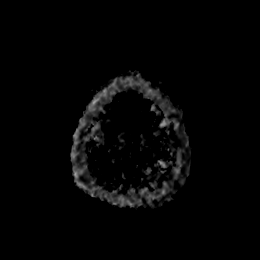

[Series 103: <mpr thick range(3)> · coronal · 3.0mm · 0.68mm/px · 1 of 53 slices shown]
[im 1/53]
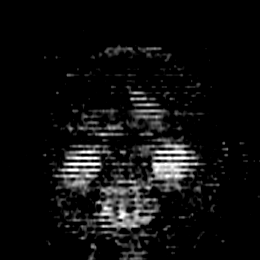

[41 of 48 positions shown; findings below may reference images not displayed]

FINDINGS: Patient was claustrophobic despite medication and exam is motion
degraded.

No acute infarct.

No intracranial hemorrhage.

Scattered punctate and patchy nonspecific white matter type changes
similar to prior exam. In this 53-year-old diabetic hypertensive
patient with hyperlipidemia, white matter type changes are probably
related to result of small vessel disease. Other causes of white
matter type changes not entirely excluded although felt to be less
likely include that secondary to; demyelinating process, vasculitis,
inflammatory process or migraine headaches.

No hydrocephalus.

Small left vertebral artery once again noted. Right vertebral
artery, basilar artery and internal carotid arteries are patent as
are the major dural sinuses.

Partially empty sella unchanged. No flattening of the globes as can
be seen with pseudotumor cerebri

Cervical medullary junction and pineal region unremarkable.
IMPRESSION: Motion degraded examination.

No acute infarct.

No significant change in nonspecific white matter type changes which
may reflect changes of small vessel disease. Please see above.

## 2016-02-08 DIAGNOSIS — H109 Unspecified conjunctivitis: Secondary | ICD-10-CM | POA: Diagnosis not present

## 2016-02-08 DIAGNOSIS — F17210 Nicotine dependence, cigarettes, uncomplicated: Secondary | ICD-10-CM | POA: Diagnosis not present

## 2016-02-08 DIAGNOSIS — R05 Cough: Secondary | ICD-10-CM | POA: Diagnosis not present

## 2016-02-08 DIAGNOSIS — I10 Essential (primary) hypertension: Secondary | ICD-10-CM | POA: Diagnosis not present

## 2016-02-25 IMAGING — US US CAROTID DUPLEX BILAT
1 series · 13 of 24 positions shown · non-contrast
Comparison: 03/19/2014

CLINICAL DATA: Stroke

EXAM:
BILATERAL CAROTID DUPLEX ULTRASOUND
TECHNIQUE: Gray scale imaging, color Doppler and duplex ultrasound was
performed of bilateral carotid and vertebral arteries in the neck.

[Series 1: us carotid duplex bilat · 0.06mm/px · 13 of 68 slices shown]
[im 1/68]
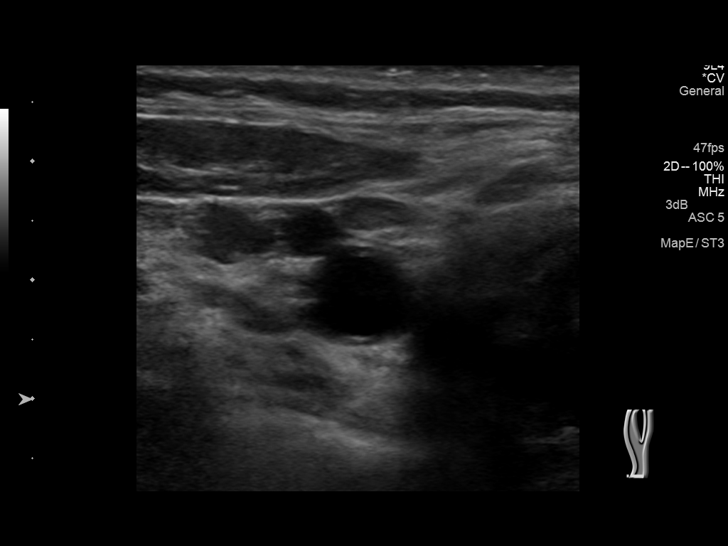
[im 6/68]
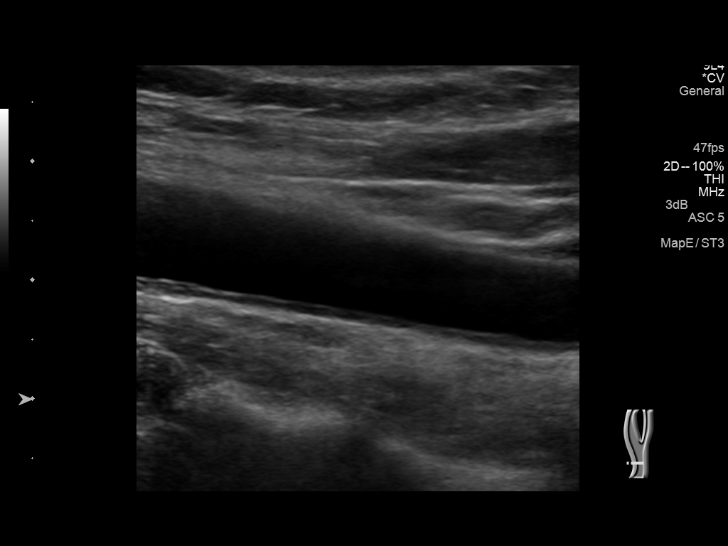
[im 12/68]
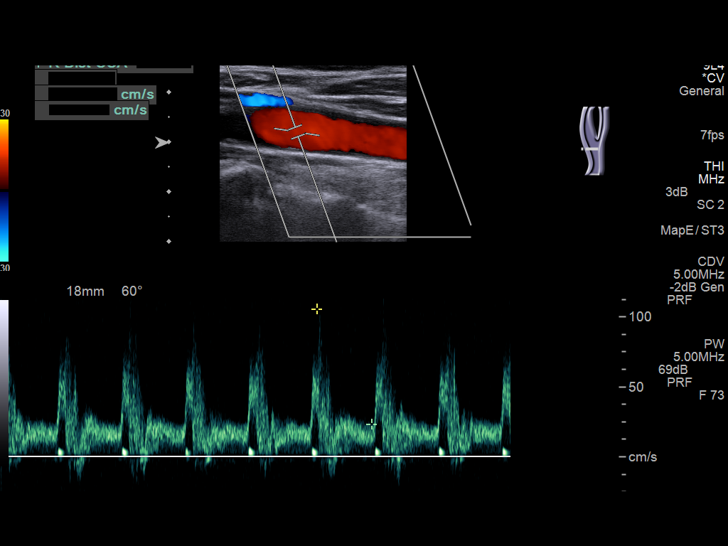
[im 18/68]
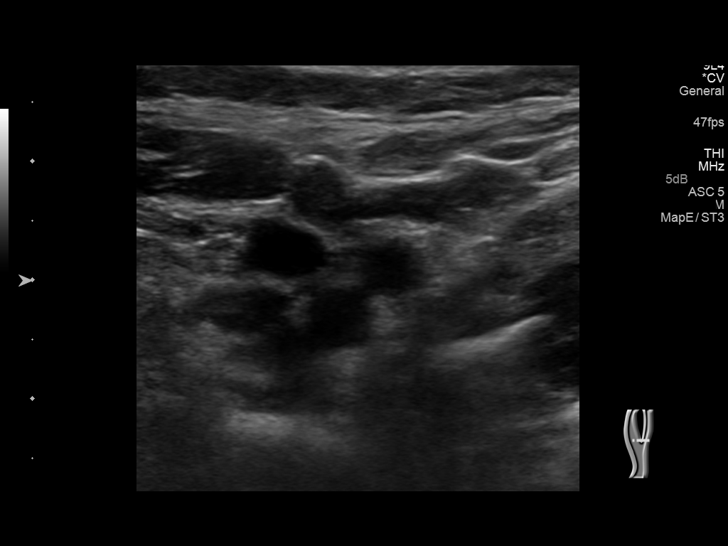
[im 24/68]
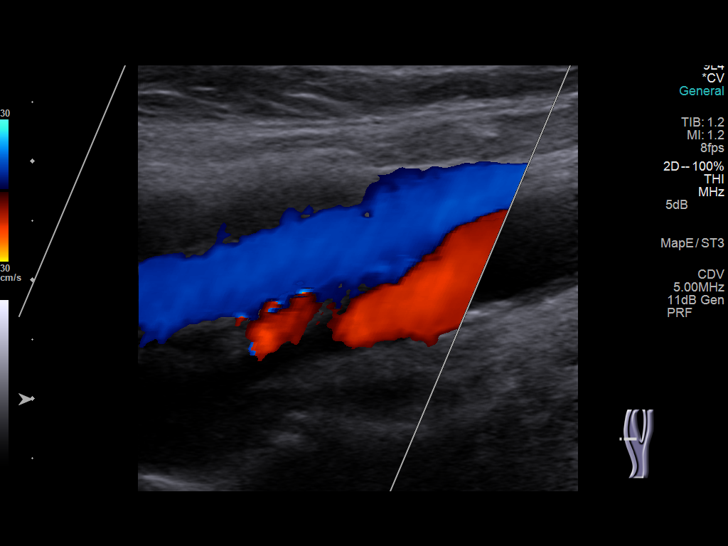
[im 30/68]
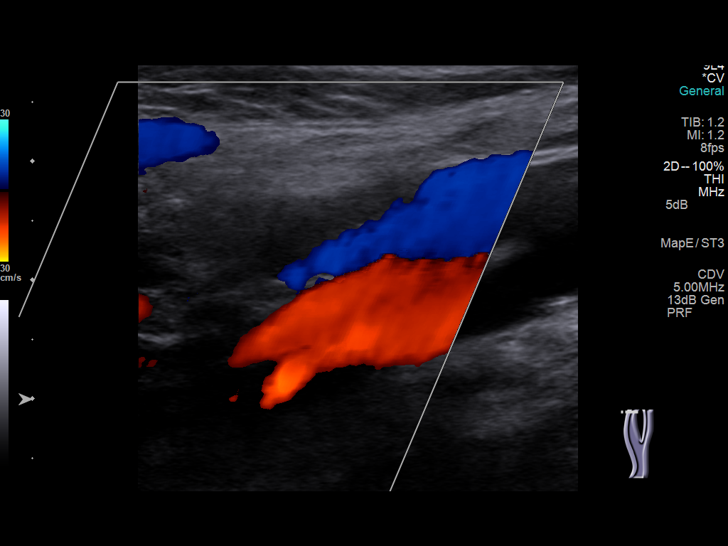
[im 35/68]
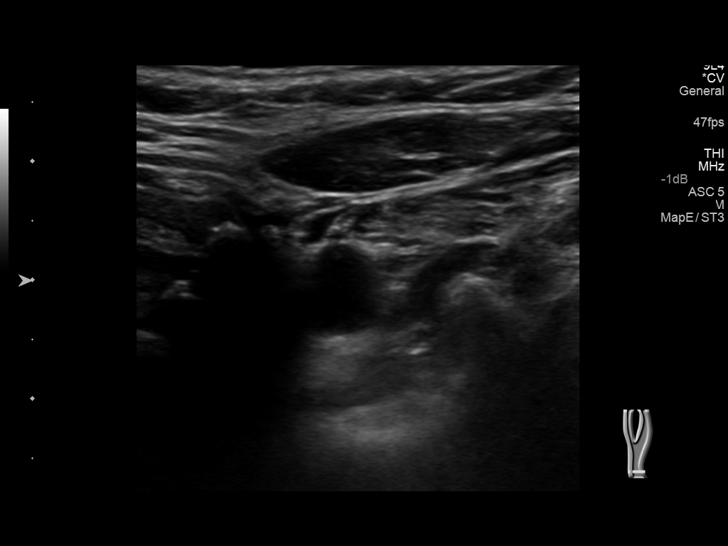
[im 38/68]
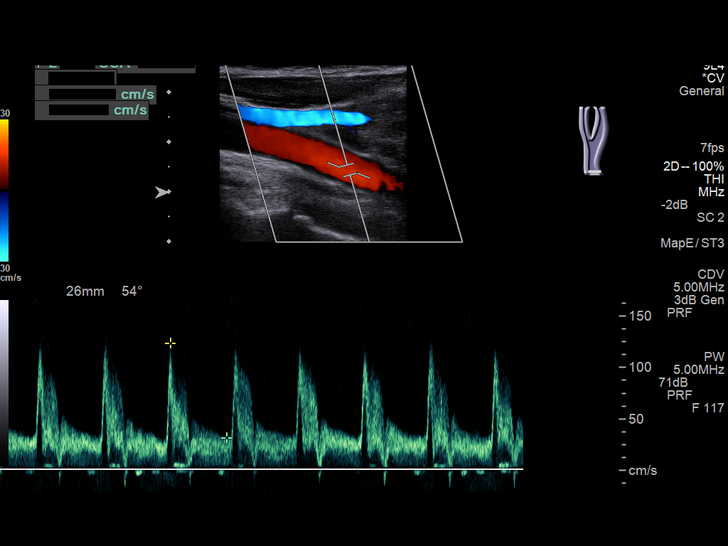
[im 44/68]
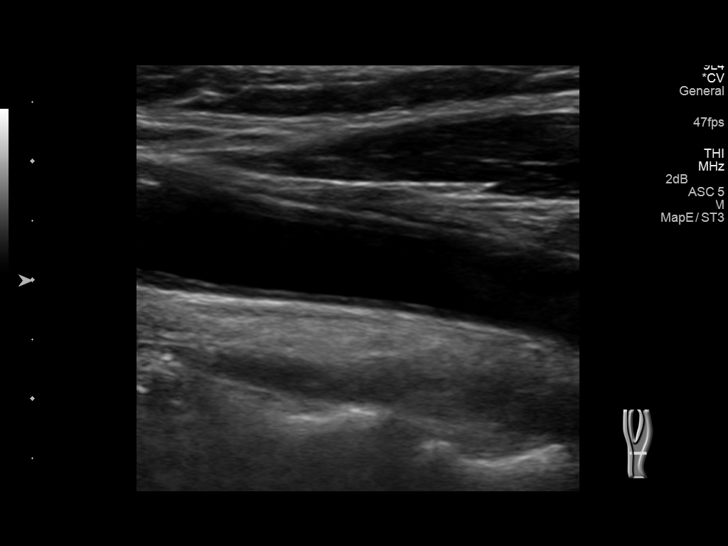
[im 50/68]
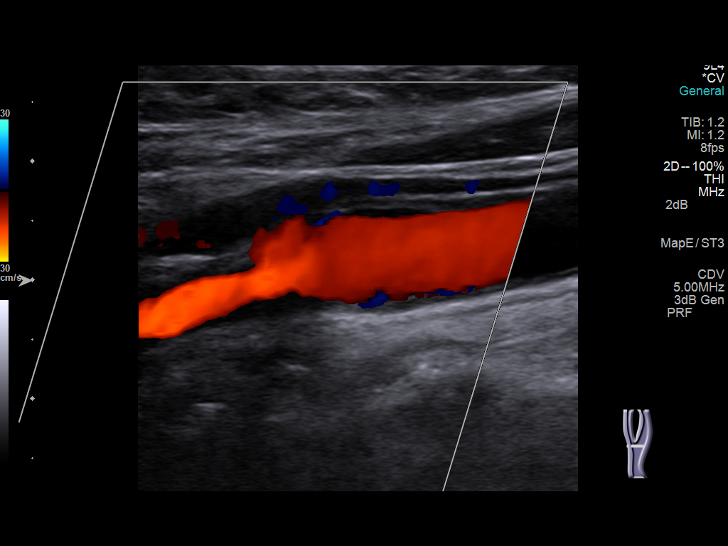
[im 56/68]
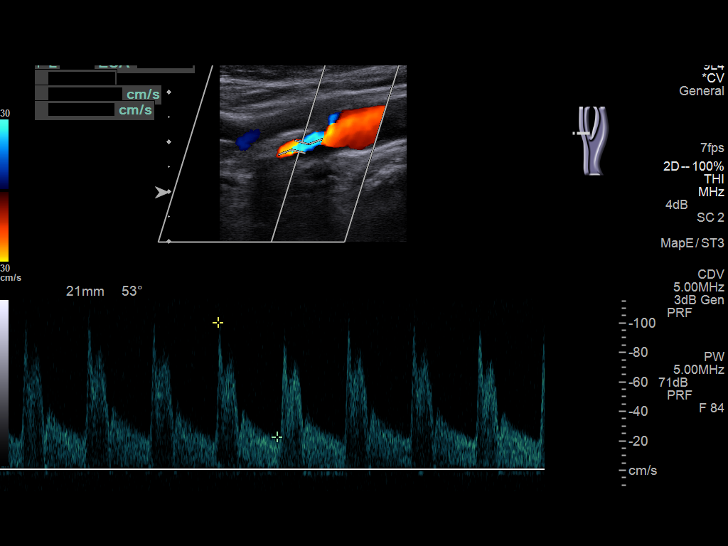
[im 62/68]
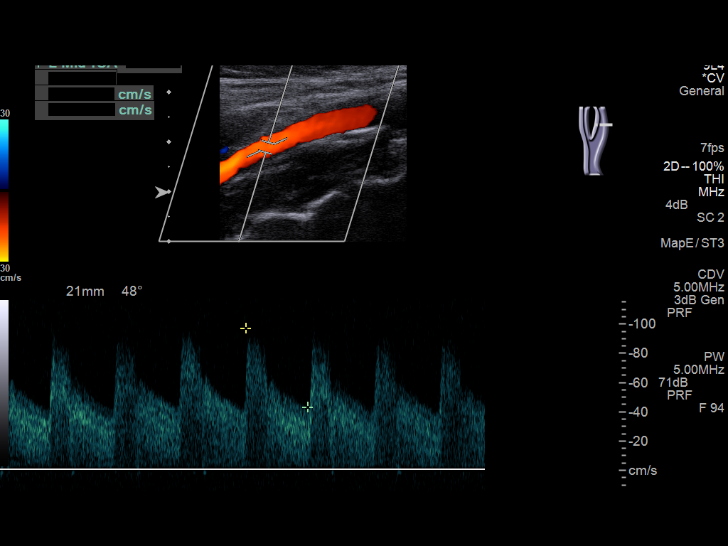
[im 68/68]
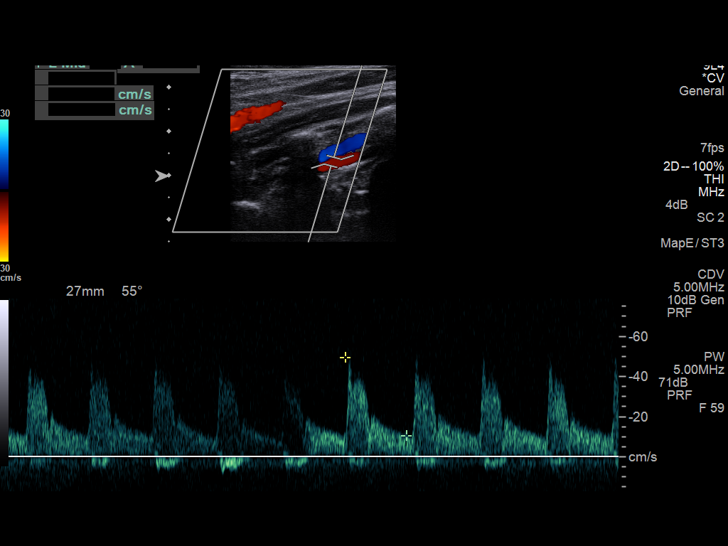

[13 of 24 positions shown; findings below may reference images not displayed]

REVIEW OF SYSTEMS:
Quantification of carotid stenosis is based on velocity parameters
that correlate the residual internal carotid diameter with
NASCET-based stenosis levels, using the diameter of the distal
internal carotid lumen as the denominator for stenosis measurement.

The following velocity measurements were obtained:

PEAK SYSTOLIC/END DIASTOLIC

RIGHT

ICA:                     73/24cm/sec

CCA:                     100/22cm/sec

SYSTOLIC ICA/CCA RATIO:

DIASTOLIC ICA/CCA RATIO:

ECA:                     128cm/sec

LEFT

ICA:                     97/43cm/sec

CCA:                     81/23cm/sec

SYSTOLIC ICA/CCA RATIO:

DIASTOLIC ICA/CCA RATIO:

ECA:                     100cm/sec
FINDINGS: RIGHT CAROTID ARTERY: Mild circumferential plaque in the bulb
extending to the ICA origin. No high-grade stenosis. Normal
waveforms and color Doppler signal.

RIGHT VERTEBRAL ARTERY:  Normal flow direction and waveform.

LEFT CAROTID ARTERY: Eccentric partially calcified plaque in the
bulb and proximal ICA. No high-grade stenosis. Normal waveforms and
color Doppler signal.

LEFT VERTEBRAL ARTERY: Normal flow direction and waveform.
IMPRESSION: 1. Mild bilateral carotid bifurcation and proximal ICA plaque, left
greater than right, resulting in less than 50% diameter stenosis.
The exam does not exclude plaque ulceration or embolization.
Continued surveillance recommended.

## 2016-03-09 IMAGING — CT CT HEAD W/O CM
1 series · 16 of 30 positions shown, 20 images · non-contrast
Comparison: None.

CLINICAL DATA: Left-sided headache and hand weakness. History of
migraines.

EXAM:
CT HEAD WITHOUT CONTRAST
TECHNIQUE: Contiguous axial images were obtained from the base of the skull
through the vertex without intravenous contrast.

[Series 2: headseq 4.8 h37s · axial · 0.40mm/px · z∈[+1387,+1542]mm · 16 of 36 slices shown, 20 images]
[im 2/36  brain]
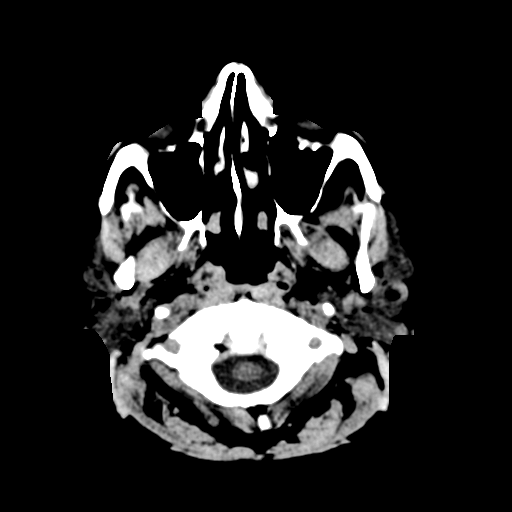
[im 2/36  bone]
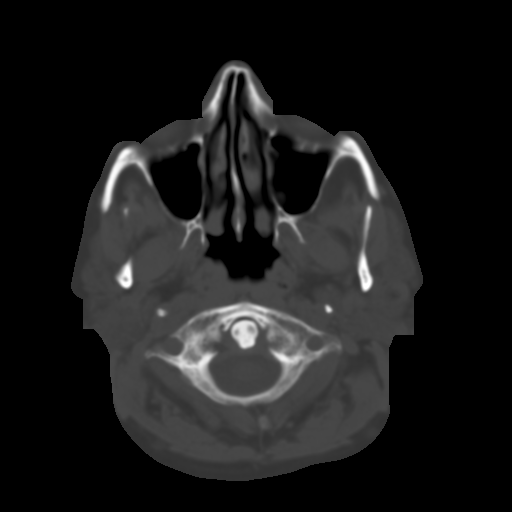
[im 4/36  brain]
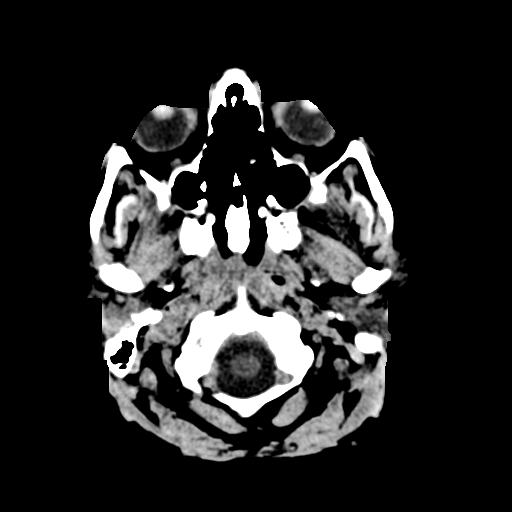
[im 7/36  brain]
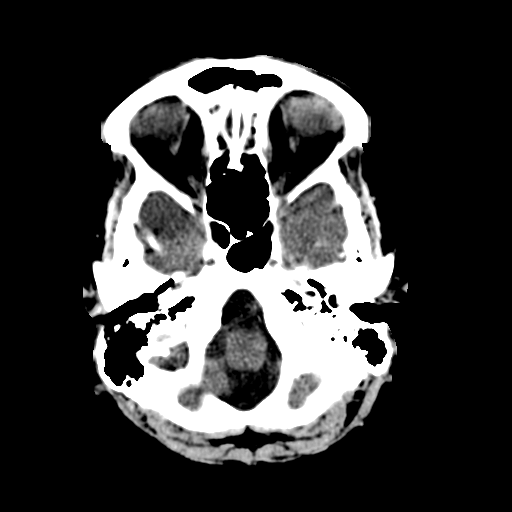
[im 9/36  brain]
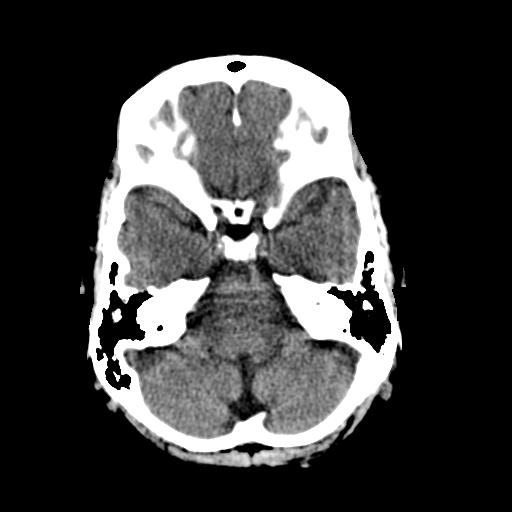
[im 10/36  brain]
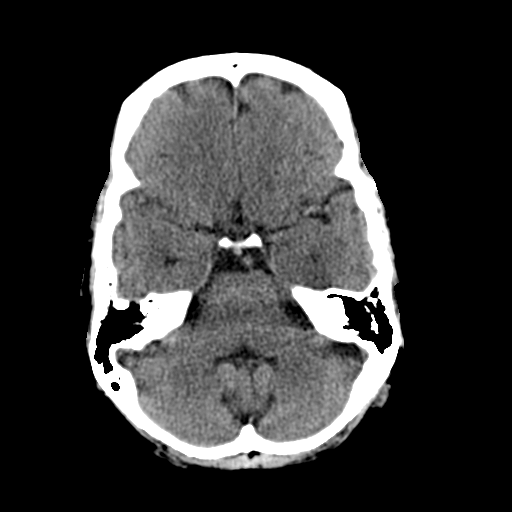
[im 10/36  bone]
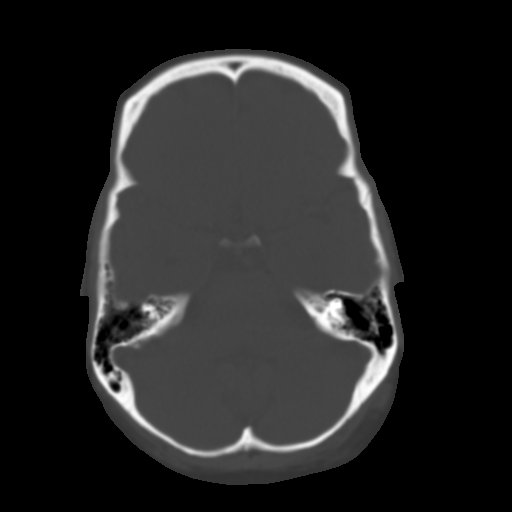
[im 13/36  brain]
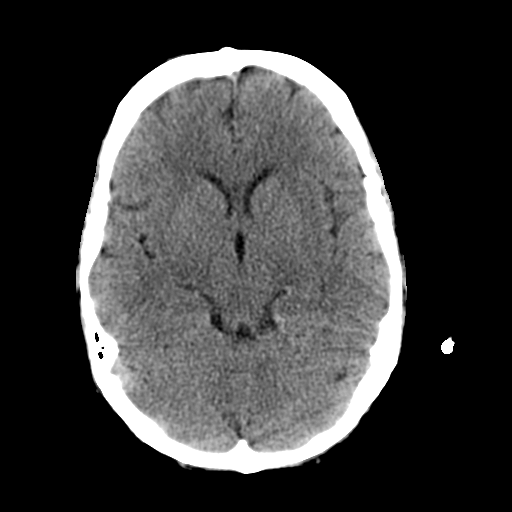
[im 15/36  brain]
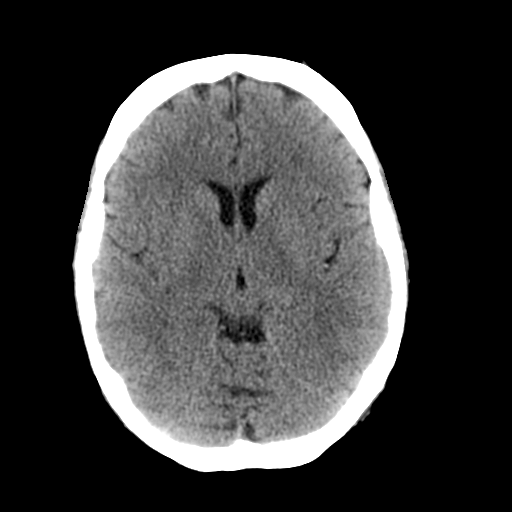
[im 17/36  brain]
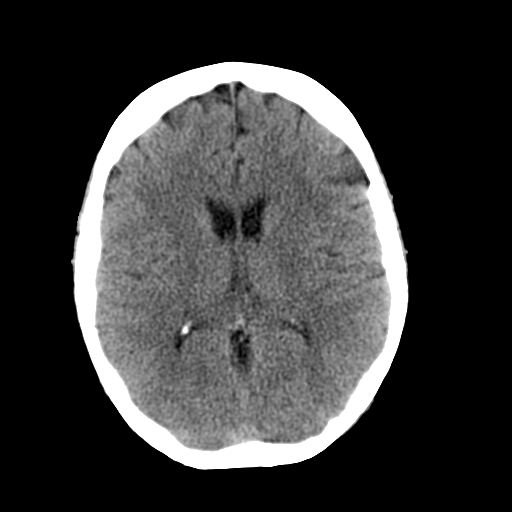
[im 19/36  brain]
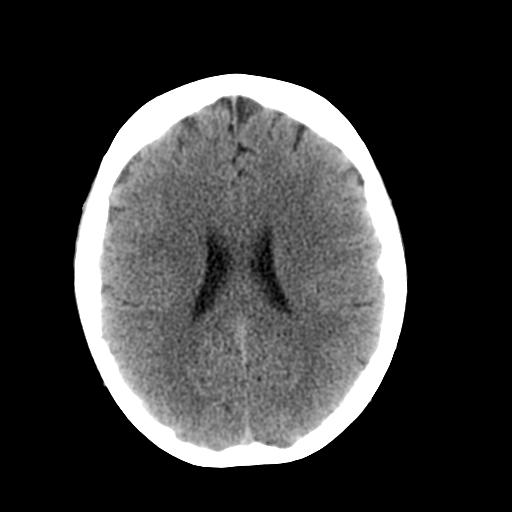
[im 19/36  bone]
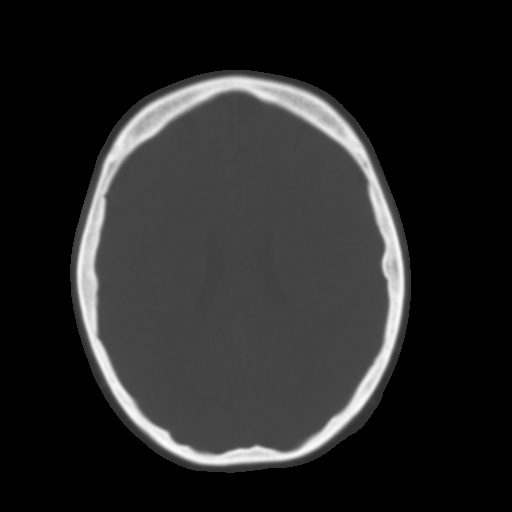
[im 21/36  brain]
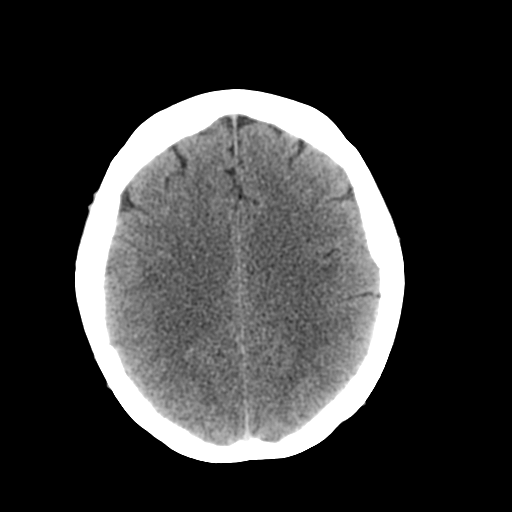
[im 23/36  brain]
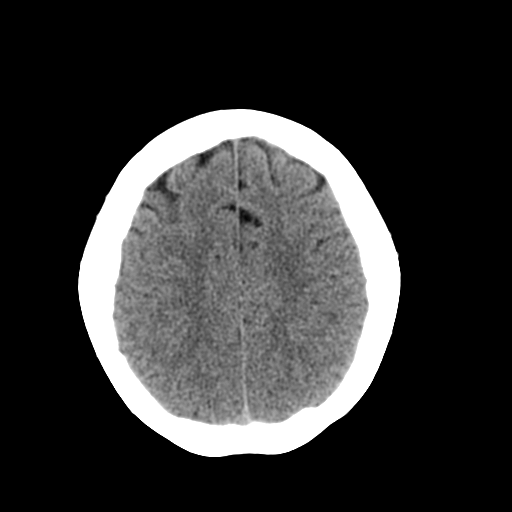
[im 26/36  brain]
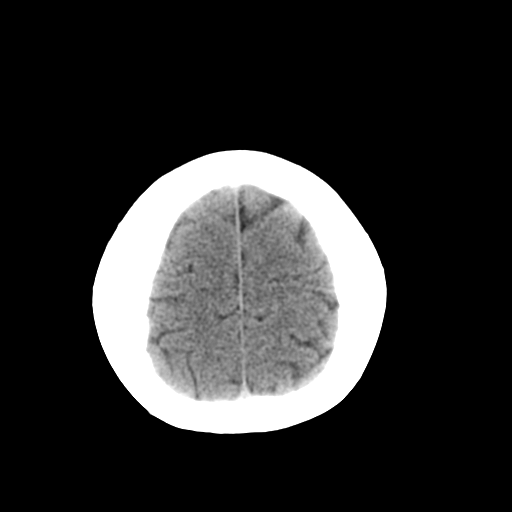
[im 27/36  brain]
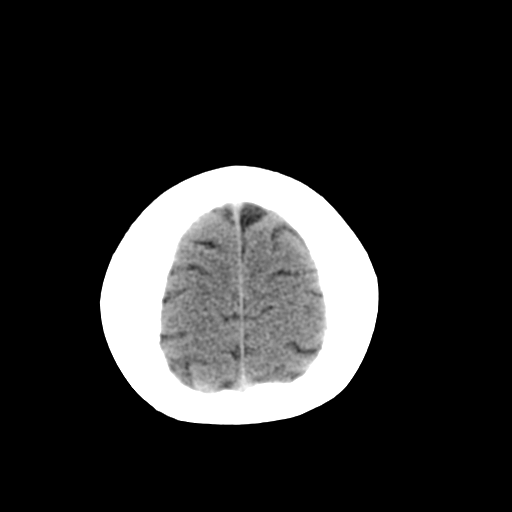
[im 27/36  bone]
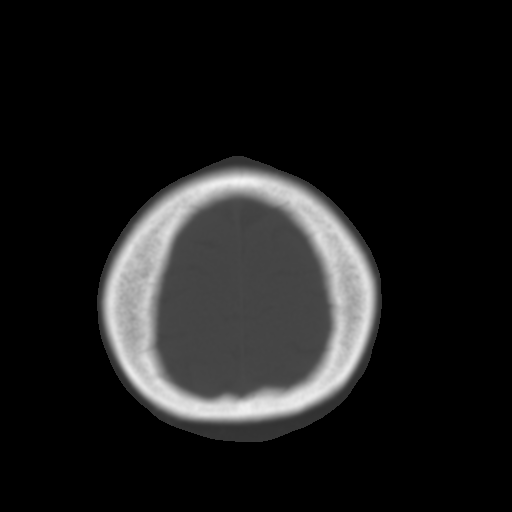
[im 29/36  brain]
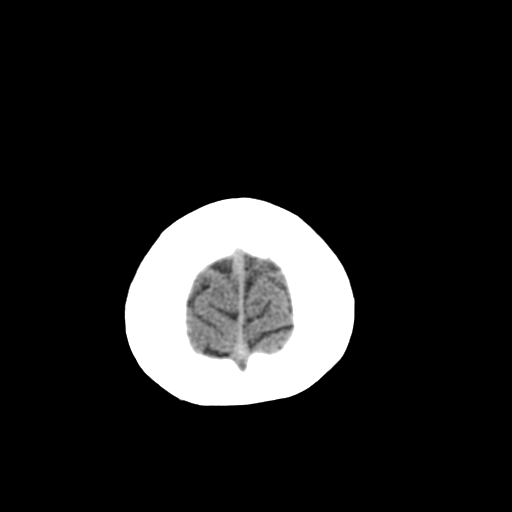
[im 32/36  brain]
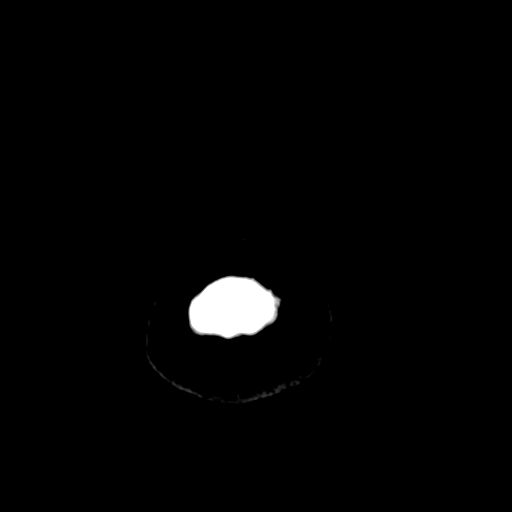
[im 34/36  brain]
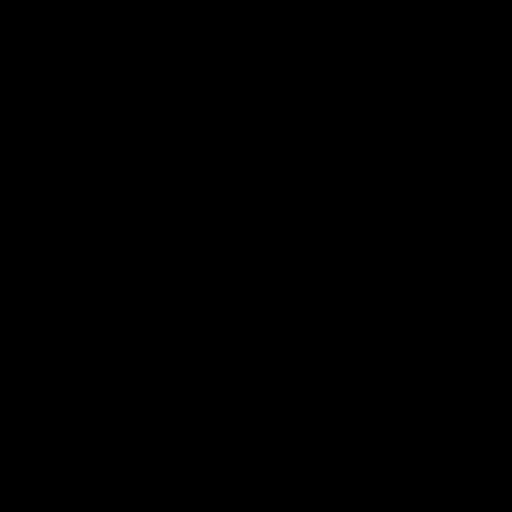

[16 of 30 positions shown; findings below may reference images not displayed]

FINDINGS: There is no evidence of mass effect, midline shift or extra-axial
fluid collections. There is no evidence of a space-occupying lesion
or intracranial hemorrhage. There is no evidence of a cortical-based
area of acute infarction.

The ventricles and sulci are appropriate for the patient's age. The
basal cisterns are patent.

Visualized portions of the orbits are unremarkable. The visualized
portions of the paranasal sinuses and mastoid air cells are
unremarkable.

The osseous structures are unremarkable.
IMPRESSION: Normal CT of the brain without intravenous contrast.

## 2016-03-21 IMAGING — CT CT HEAD W/O CM
4 of 5 series · 15 of 47 positions shown, 16 images · non-contrast
Comparison: Head CT July 06, 2014

CLINICAL DATA: Patient fell in kitchen with loss of consciousness
and pain.

EXAM:
CT HEAD WITHOUT CONTRAST
CT CERVICAL SPINE WITHOUT CONTRAST
TECHNIQUE: Multidetector CT imaging of the head and cervical spine was
performed following the standard protocol without intravenous
contrast. Multiplanar CT image reconstructions of the cervical spine
were also generated.

[Series 2: headseq 4.8 h37s · axial · 0.41mm/px · z∈[+328,+415]mm · 3 of 36 slices shown, 4 images]
[im 9/36  brain]
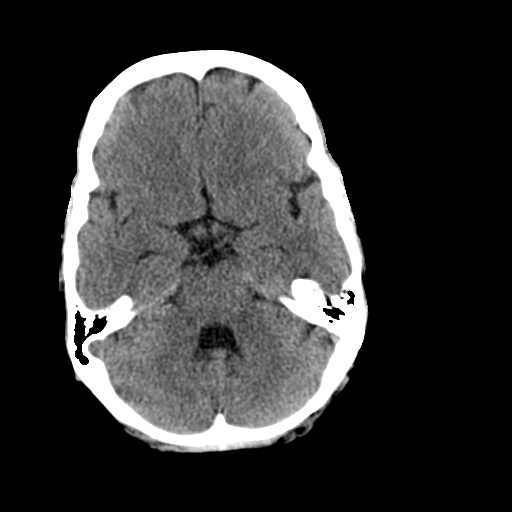
[im 9/36  bone]
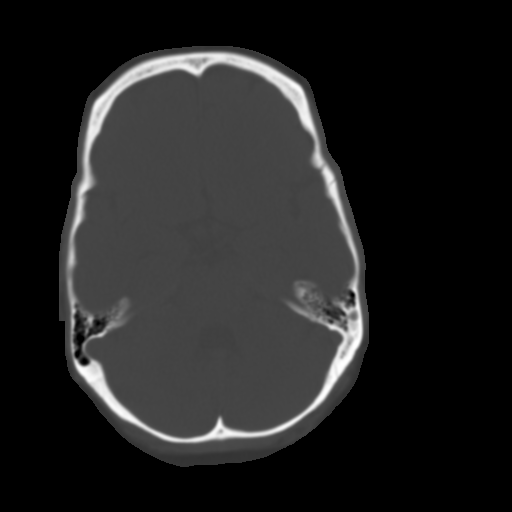
[im 18/36  brain]
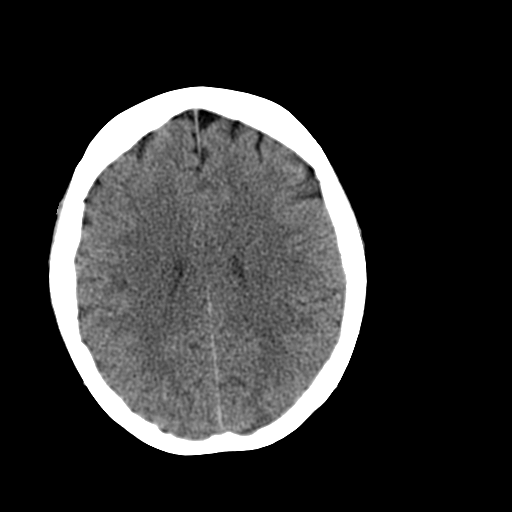
[im 27/36  brain]
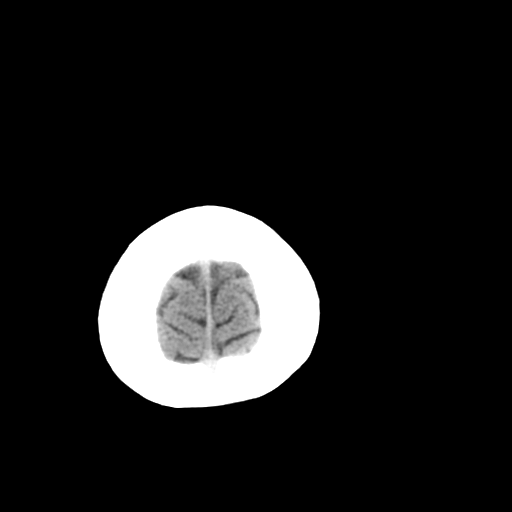

[Series 7: sagittal bone 2.0 · sagittal · 0.27mm/px · 3 of 60 slices shown]
[im 20/60  brain]
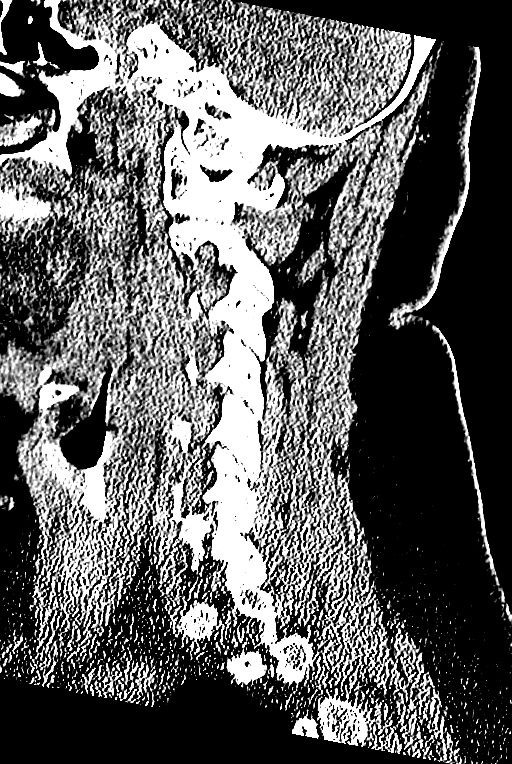
[im 30/60  brain]
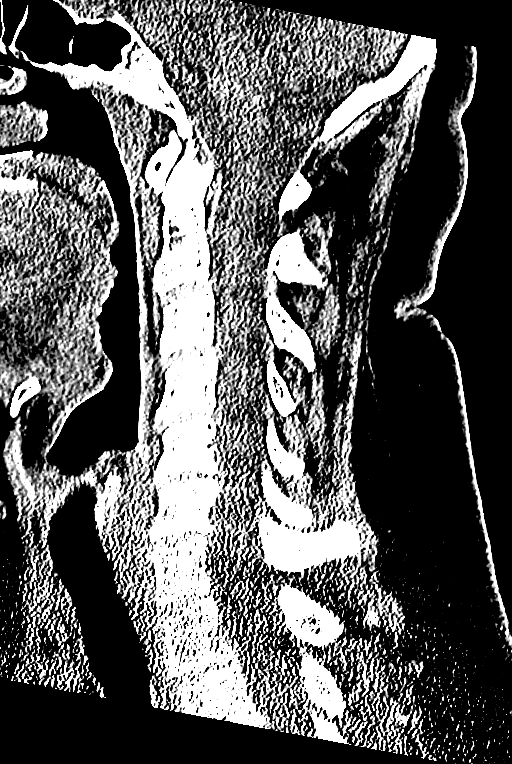
[im 40/60  brain]
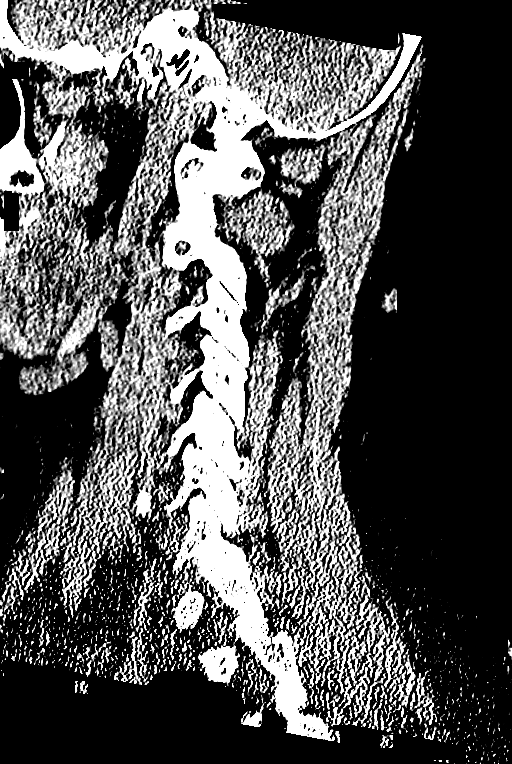

[Series 8: coronal bone 2.0 · coronal · 0.26mm/px · 3 of 58 slices shown]
[im 20/58  brain]
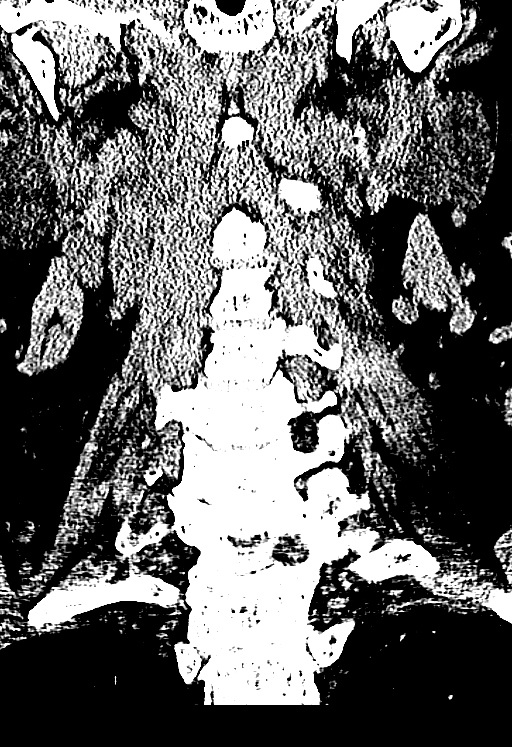
[im 26/58  brain]
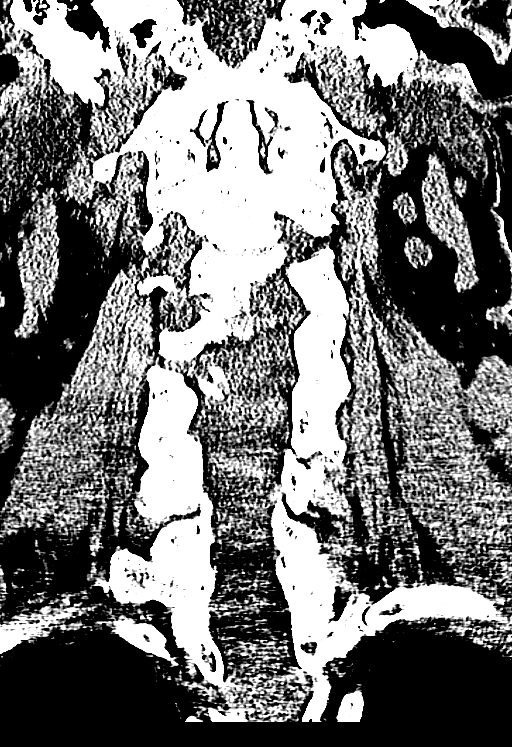
[im 32/58  brain]
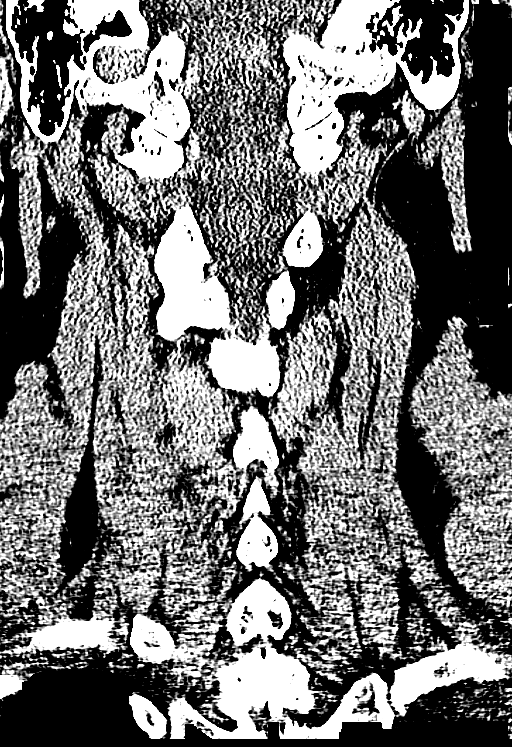

[Series 9: axial bone 2.0 · axial · 0.22mm/px · z∈[+120,+228]mm · 6 of 100 slices shown]
[im 9/100  bone]
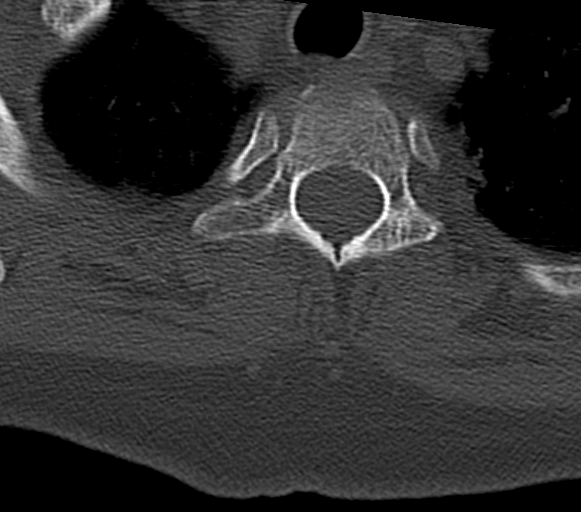
[im 25/100  bone]
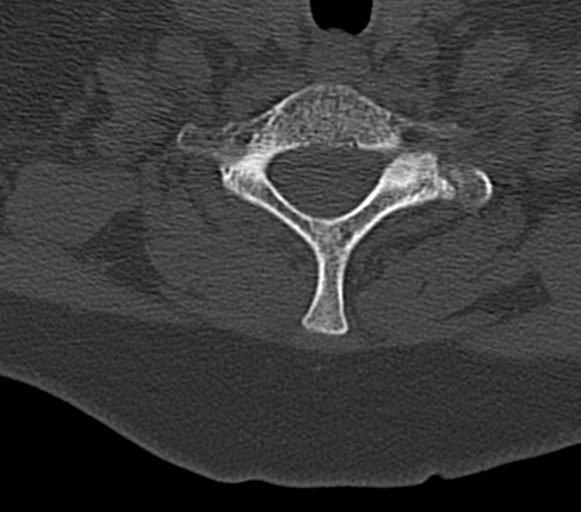
[im 34/100  bone]
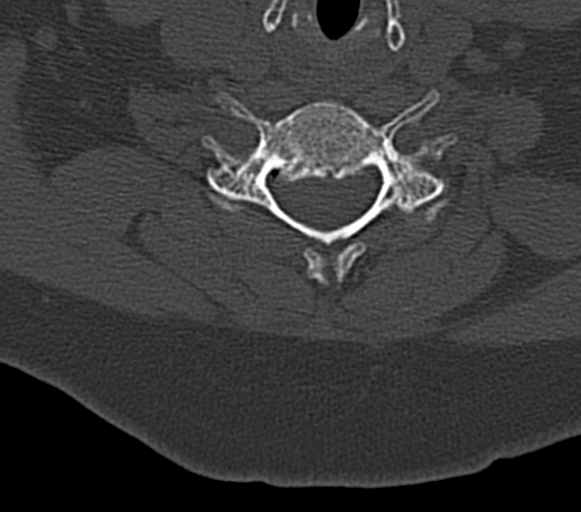
[im 42/100  bone]
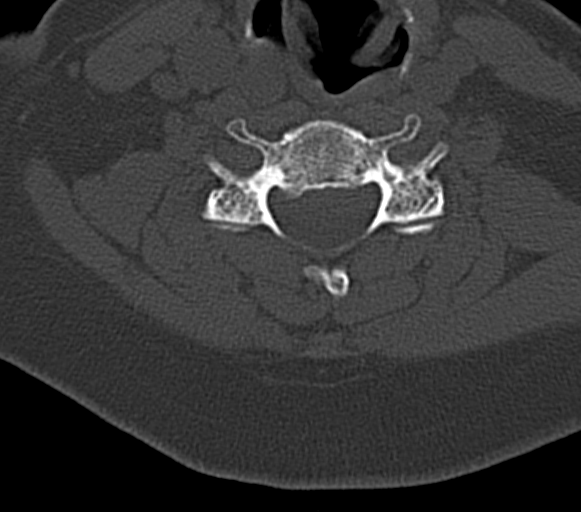
[im 58/100  bone]
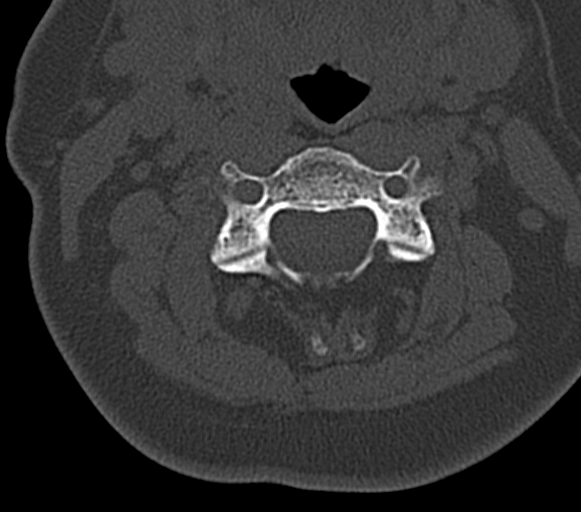
[im 67/100  bone]
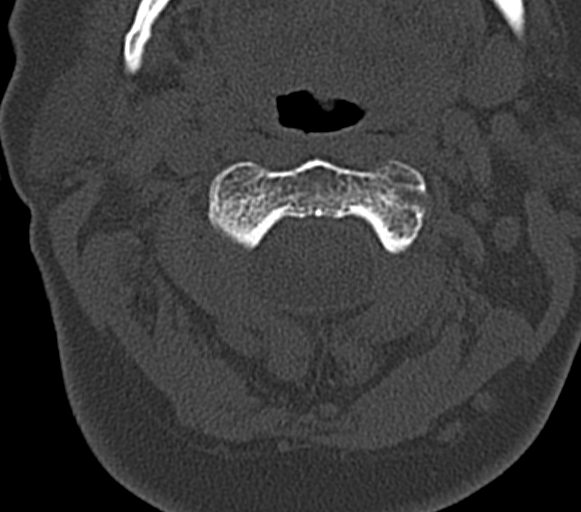

[15 of 47 positions shown; findings below may reference images not displayed]

FINDINGS: CT HEAD FINDINGS

The ventricles are normal in size and configuration. There is no
mass, hemorrhage, extra-axial fluid collection, or midline shift.
Gray-white compartments are normal. No acute infarct. Bony calvarium
appears intact. Mastoid air cells are clear.

CT CERVICAL SPINE FINDINGS

There is no fracture or spondylolisthesis. Prevertebral soft tissues
and predental space regions are normal. There is mild disc space
narrowing at C5-6 and C6-7. There is exit foraminal narrowing at
C5-6 on the right and at C6-7 bilaterally. There is no disc
extrusion or stenosis. There are scattered foci of carotid artery
calcification bilaterally.
IMPRESSION: CT head: Study within normal limits. No mass, hemorrhage, or
extra-axial fluid. Gray-white compartments normal.

CT cervical spine: Areas of osteoarthritic change. No fracture or
spondylolisthesis scattered foci of carotid artery calcification
bilaterally.

## 2016-03-21 IMAGING — CR DG LUMBAR SPINE COMPLETE 4+V
5 series · 5 of 5 positions shown · non-contrast
Comparison: Lumbar spine series of February 27, 2014

CLINICAL DATA: Status post fall at home today with low back pain;
history of previous low lumbar surgery

EXAM:
LUMBAR SPINE - COMPLETE 4+ VIEW

[view not recorded (1 of 5)]
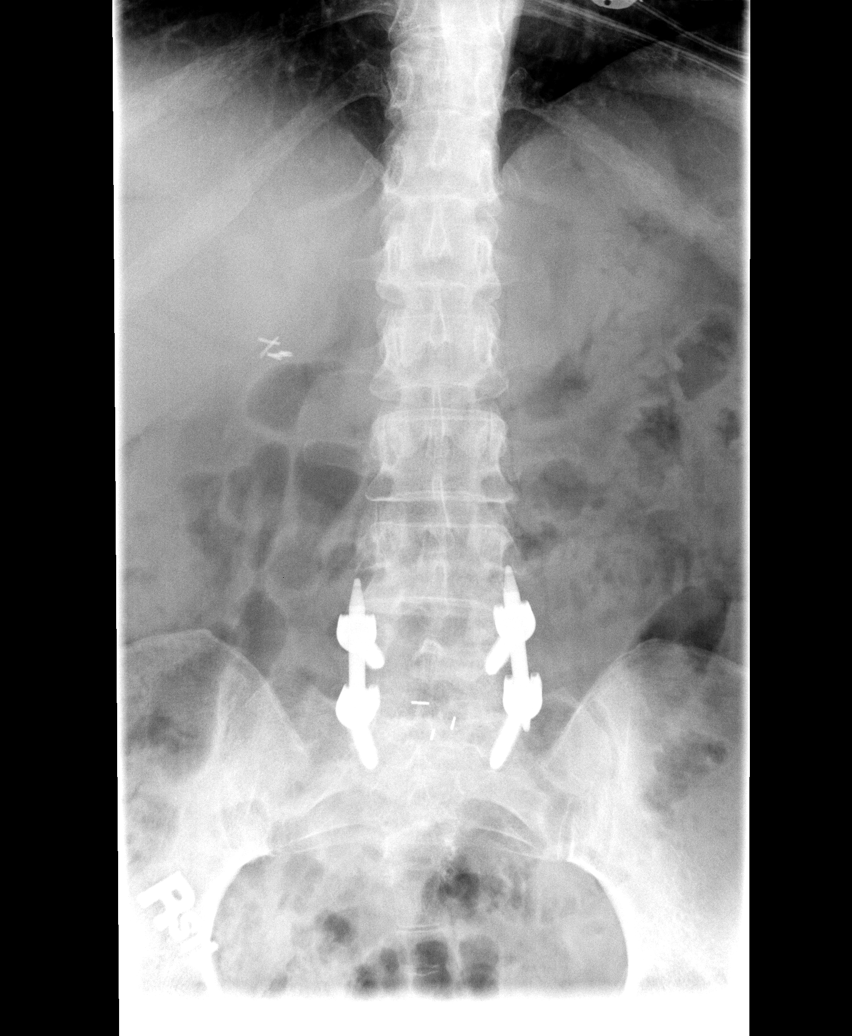

[view not recorded (2 of 5)]
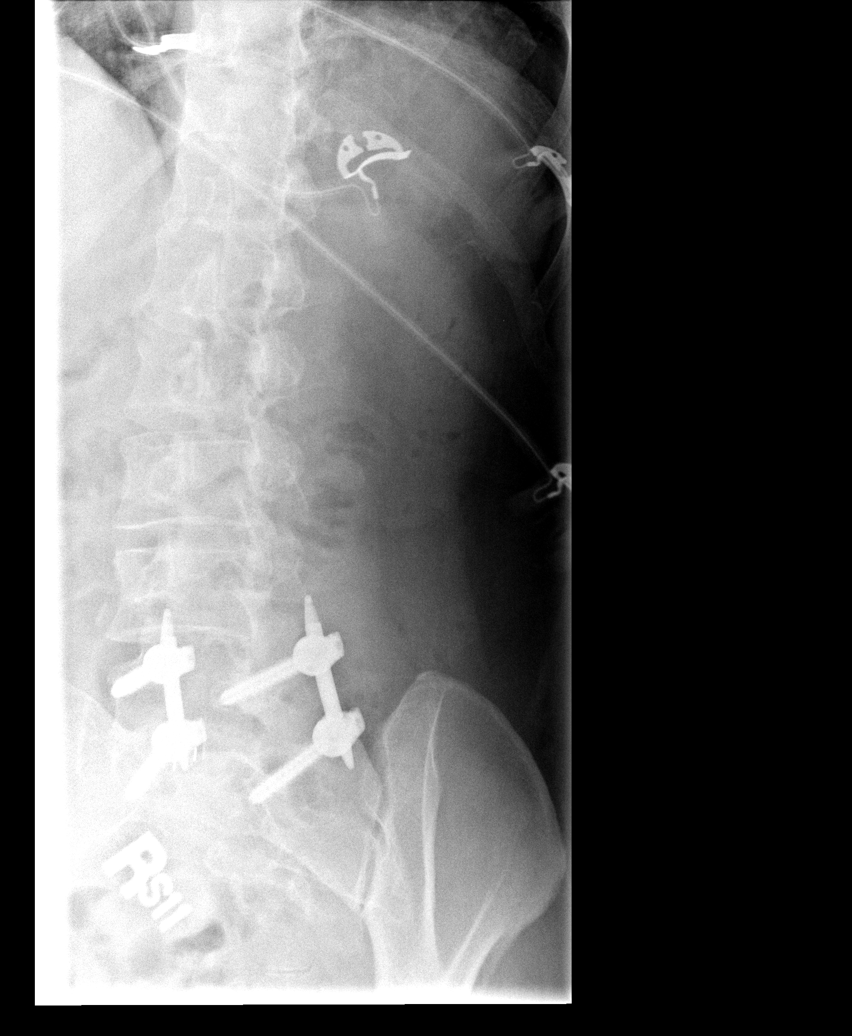

[view not recorded (3 of 5)]
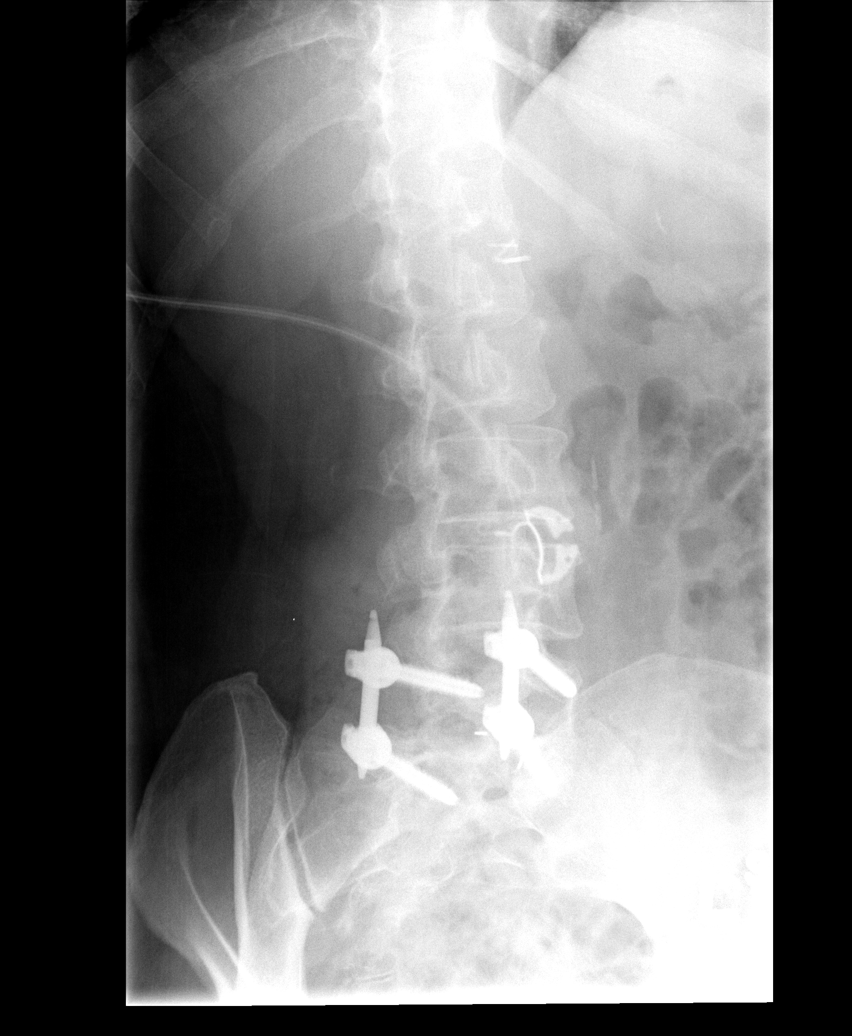

[view not recorded (4 of 5)]
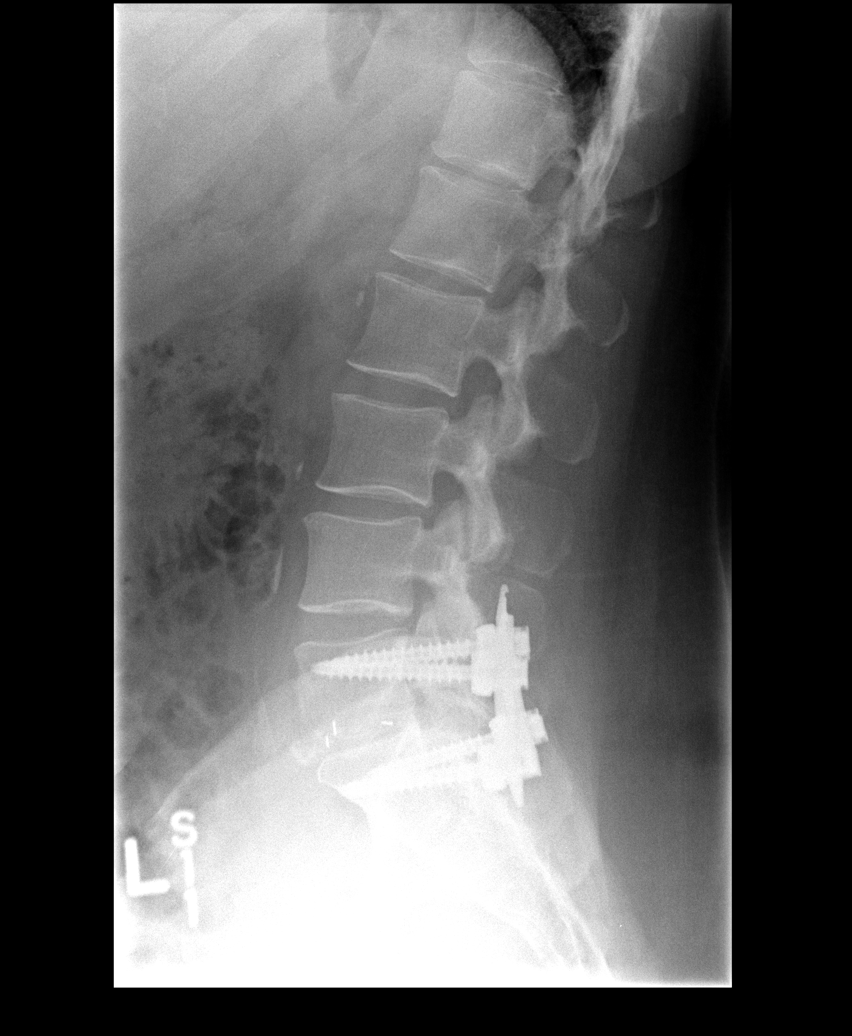

[view not recorded (5 of 5)]
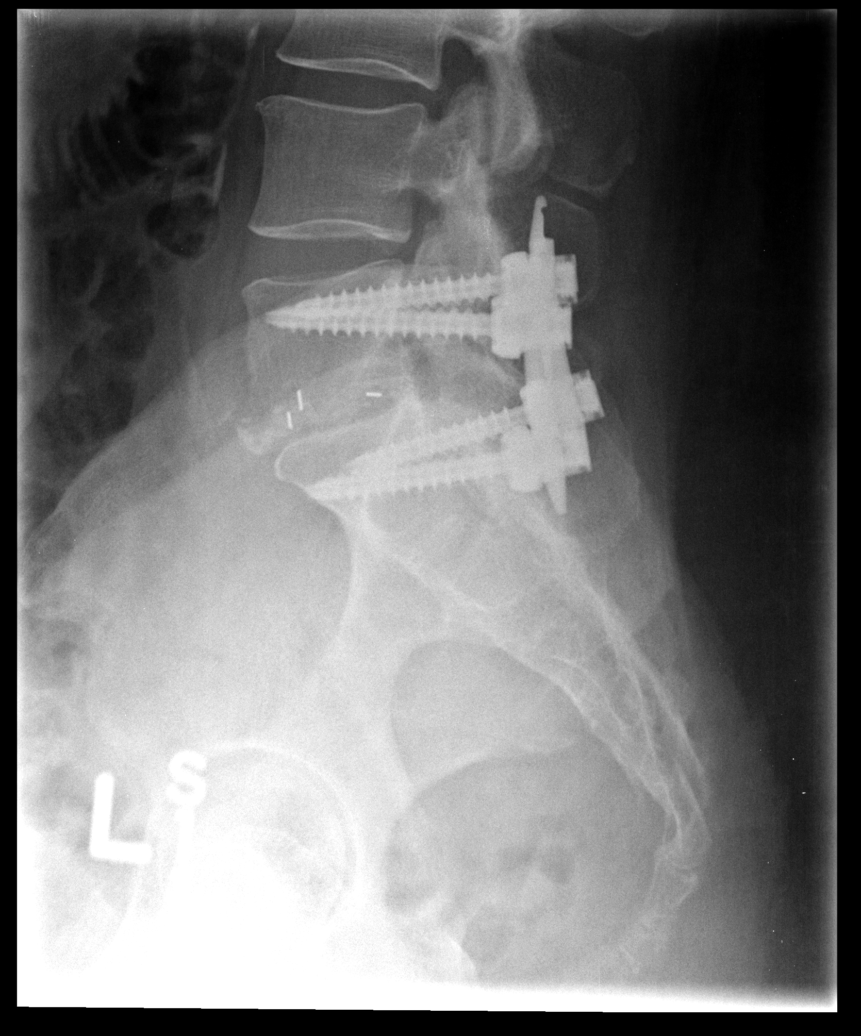

[5 of 5 positions shown; findings below may reference images not displayed]

FINDINGS: The lumbar vertebral bodies are preserved in height. The patient has
undergone previous posterior fusion at L5-S1 with placement of an
interdiscal device. The metallic hardware is intact and unchanged in
position. The other lumbar disc space heights are well maintained.
There is no spondylolisthesis. There is mild facet joint hypertrophy
at L4-5. The pedicles and transverse processes are intact where
visualized. There are surgical clips in the gallbladder fossa.
IMPRESSION: There is no acute bony abnormality of the lumbar spine. The fusion
at L5-S1 hardware is intact.

## 2016-03-21 IMAGING — CR DG CHEST 2V
2 series · 2 of 2 positions shown · non-contrast
Comparison: [facility] chest x-ray dated May 29, 2014

CLINICAL DATA: Status post fall this morning with loss of
consciousness ; history of COPD, coronary artery disease, lung
nodules, and diabetes.

EXAM:
CHEST  2 VIEW

[view not recorded (1 of 2)]
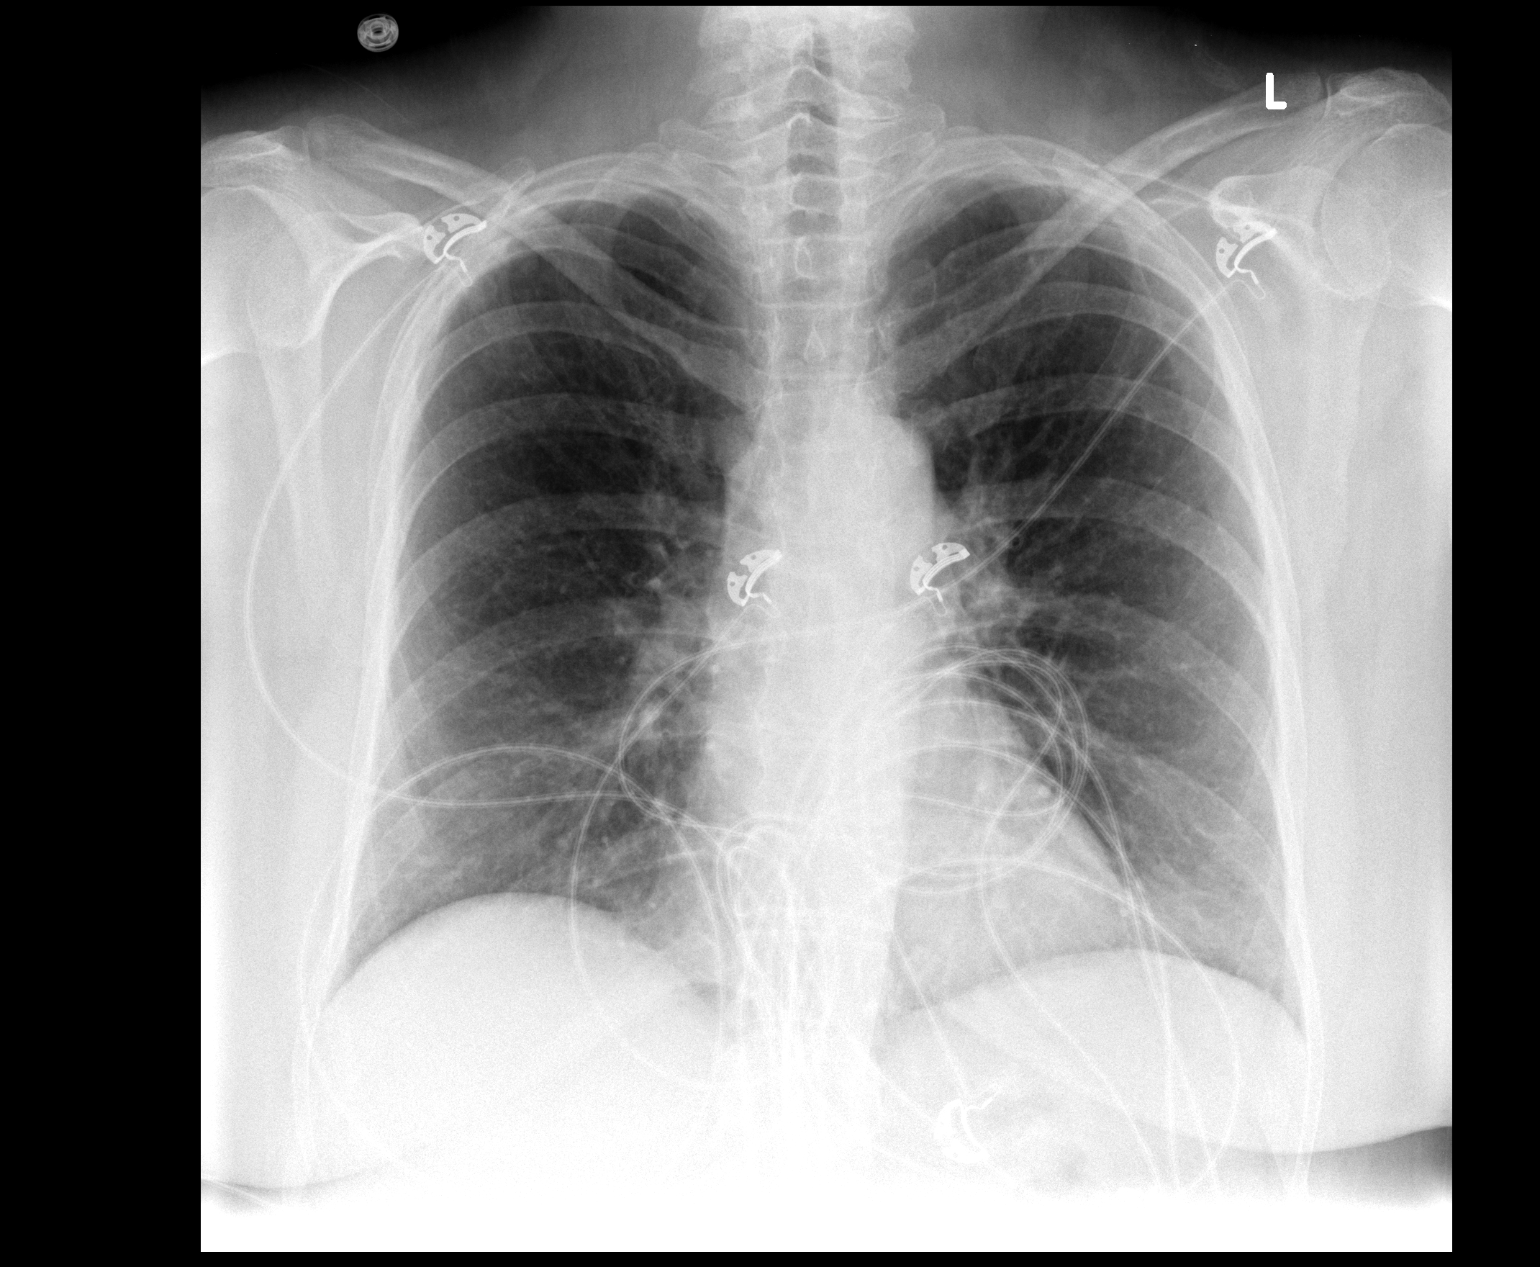

[view not recorded (2 of 2)]
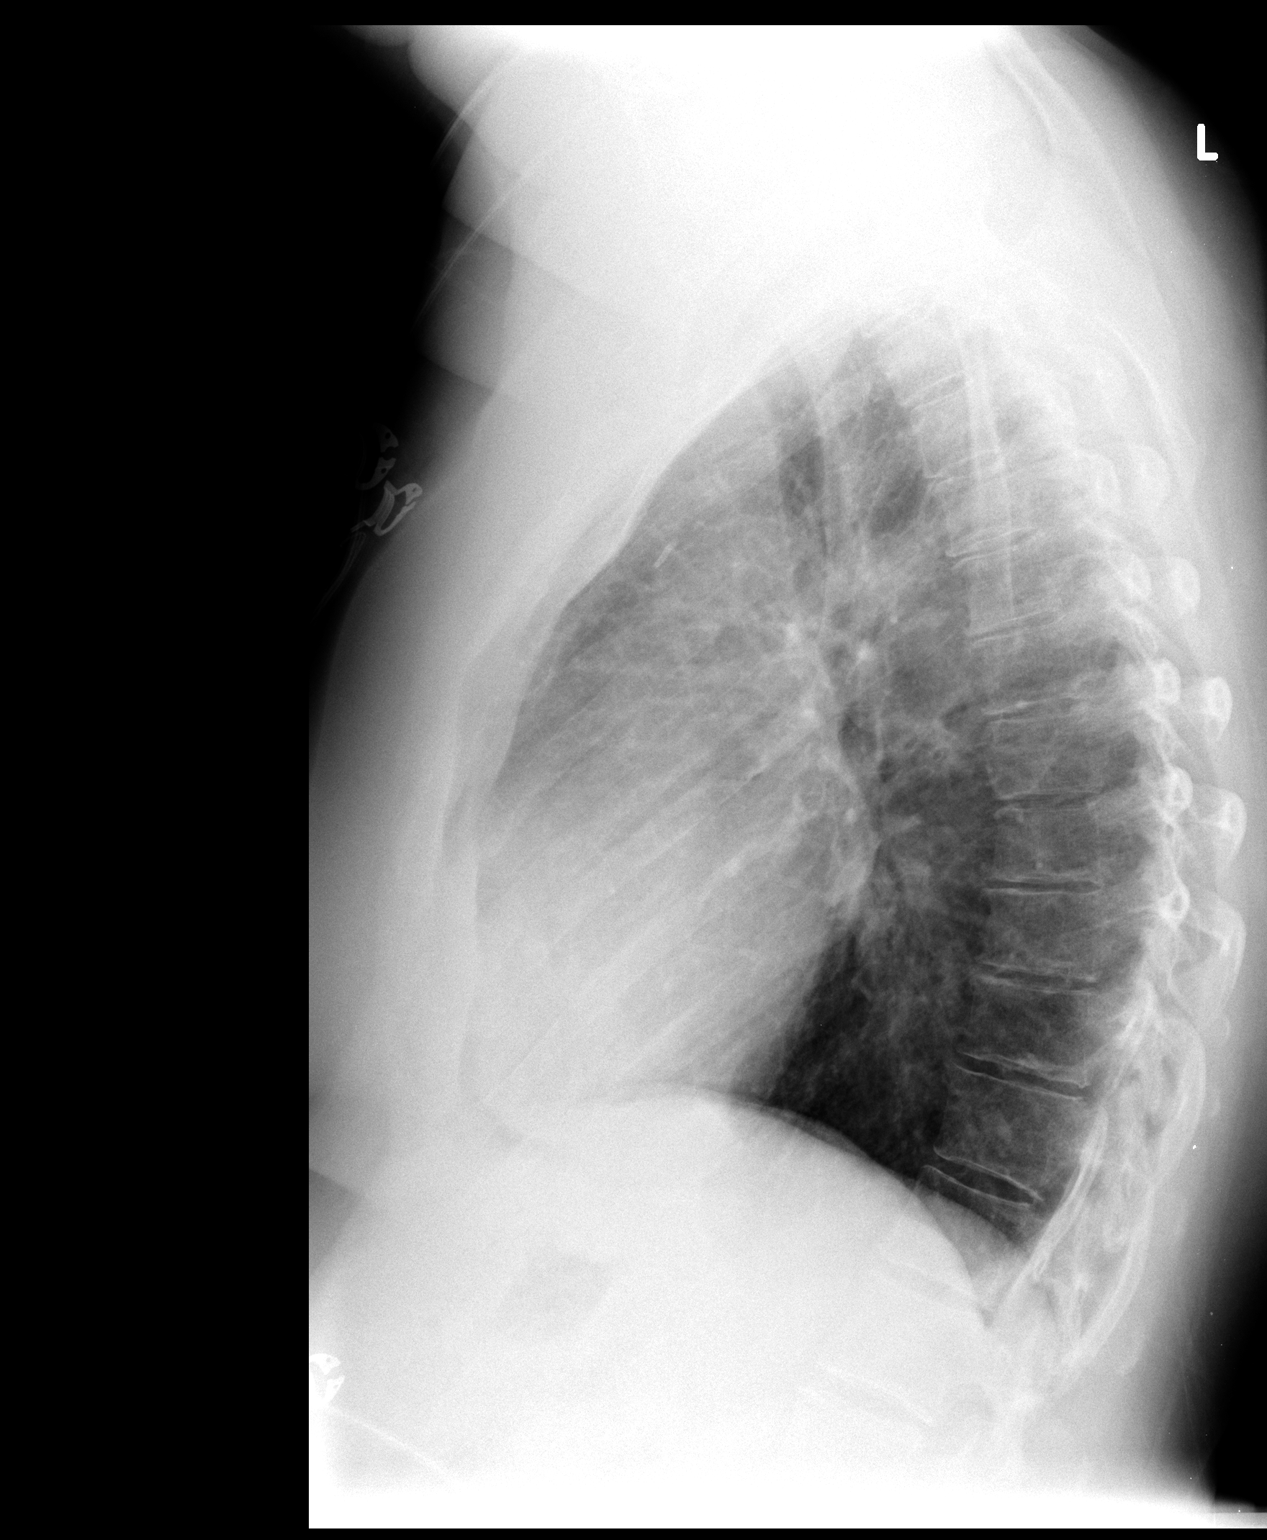

[2 of 2 positions shown; findings below may reference images not displayed]

FINDINGS: The lungs are adequately inflated and clear. There is no
pneumothorax or pleural effusion. The heart and pulmonary
vascularity are normal. The mediastinum is normal in width. The bony
thorax exhibits no acute abnormality.
IMPRESSION: There is no active cardiopulmonary disease.

## 2016-03-21 IMAGING — CR DG KNEE COMPLETE 4+V*R*
4 series · 4 of 4 positions shown · non-contrast
Comparison: Right knee series of February 27, 2014

CLINICAL DATA: Status post fall indication today with loss of
consciousness with low back and right knee pain

EXAM:
RIGHT KNEE - COMPLETE 4+ VIEW

[view not recorded (1 of 4)]
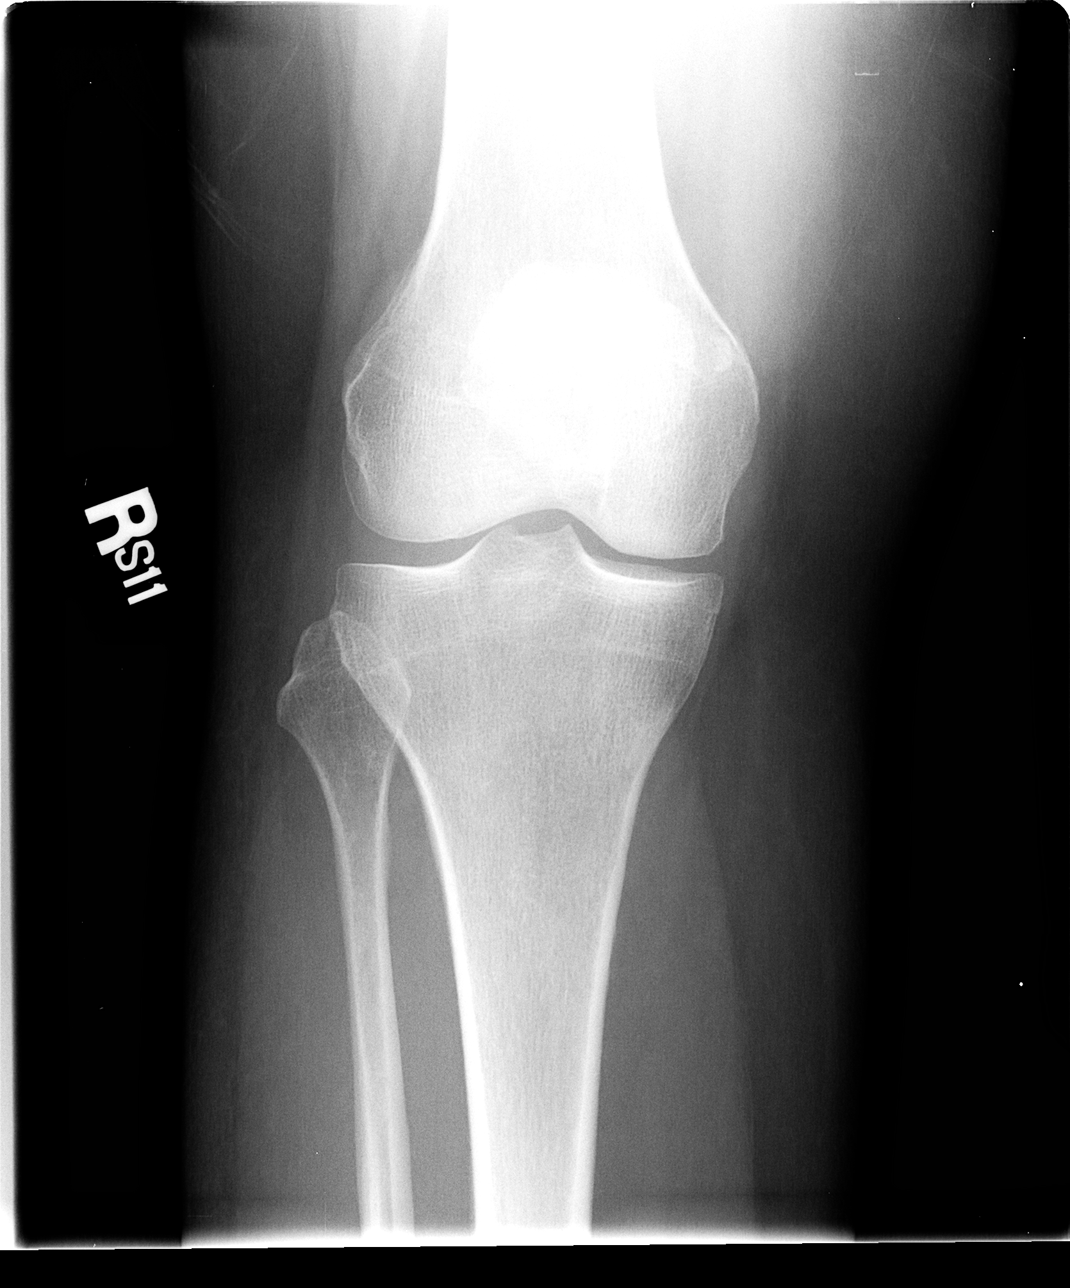

[view not recorded (2 of 4)]
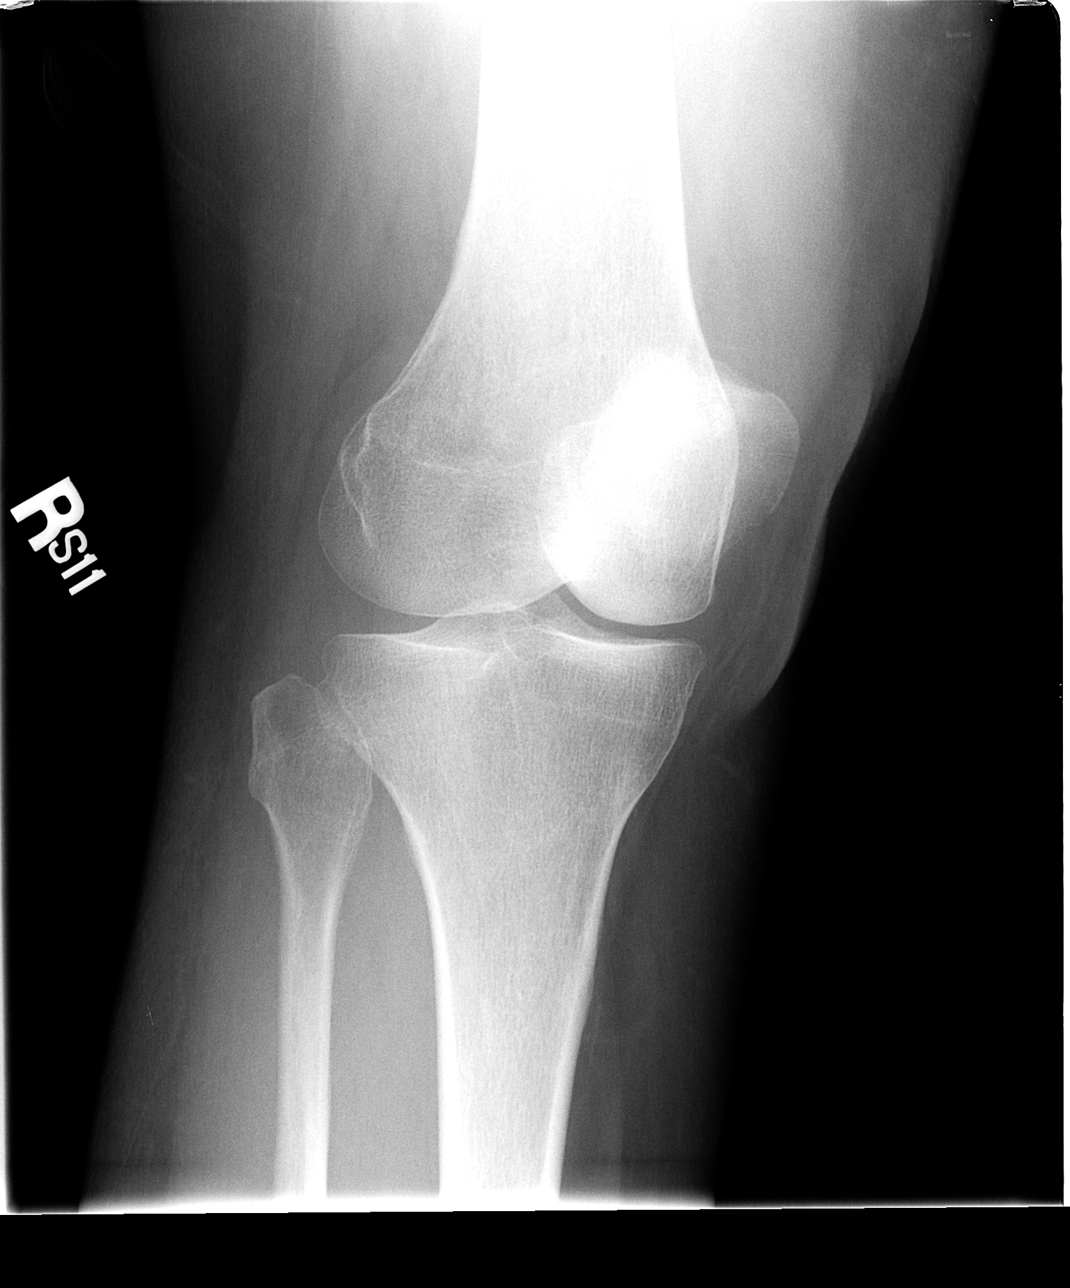

[view not recorded (3 of 4)]
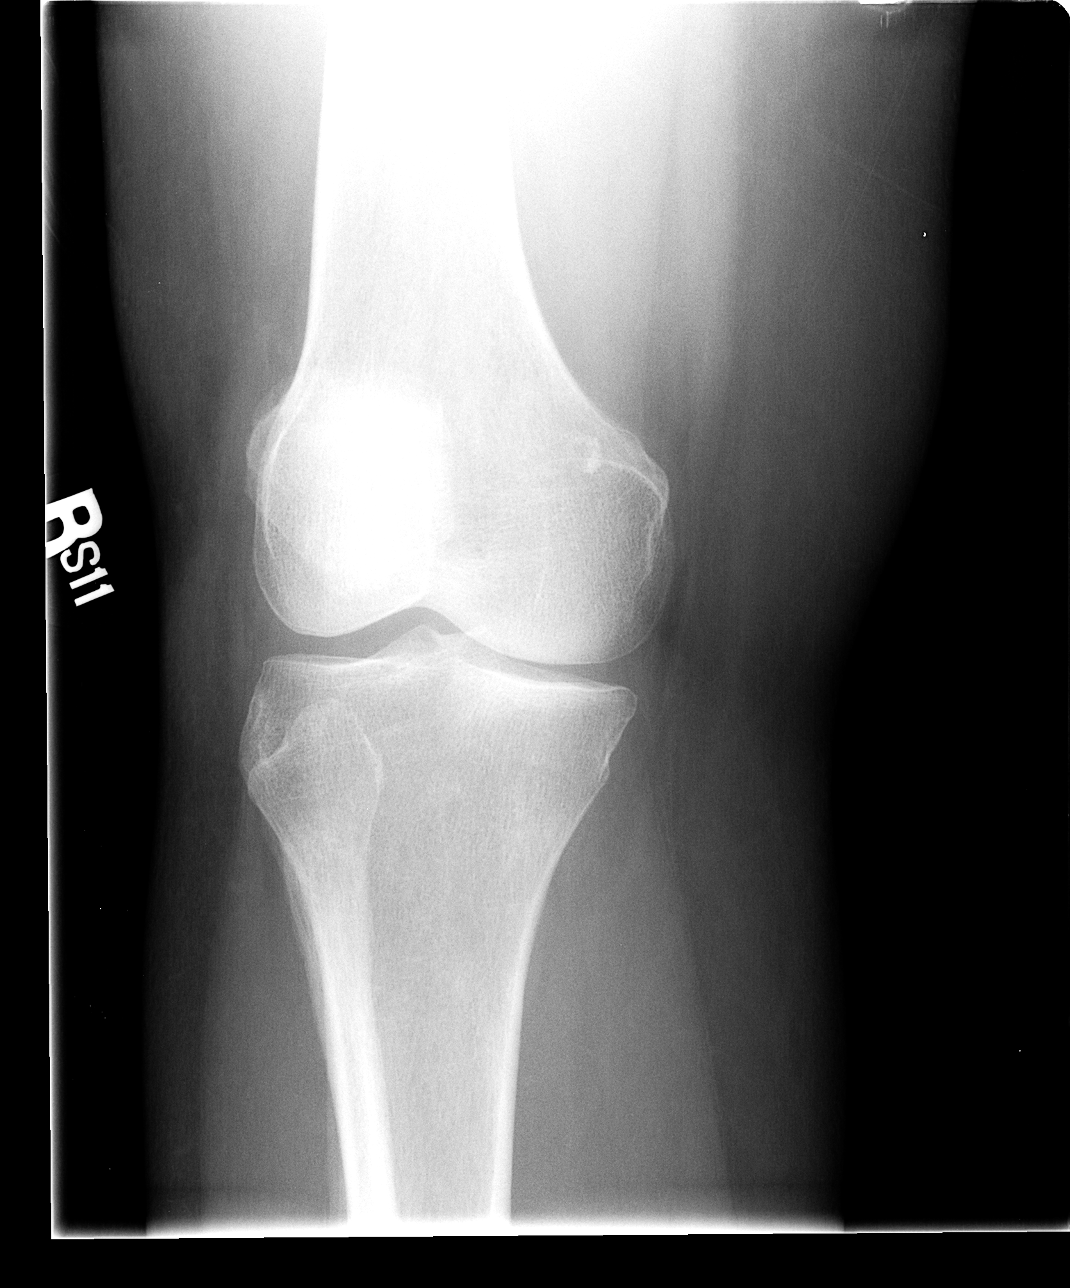

[view not recorded (4 of 4)]
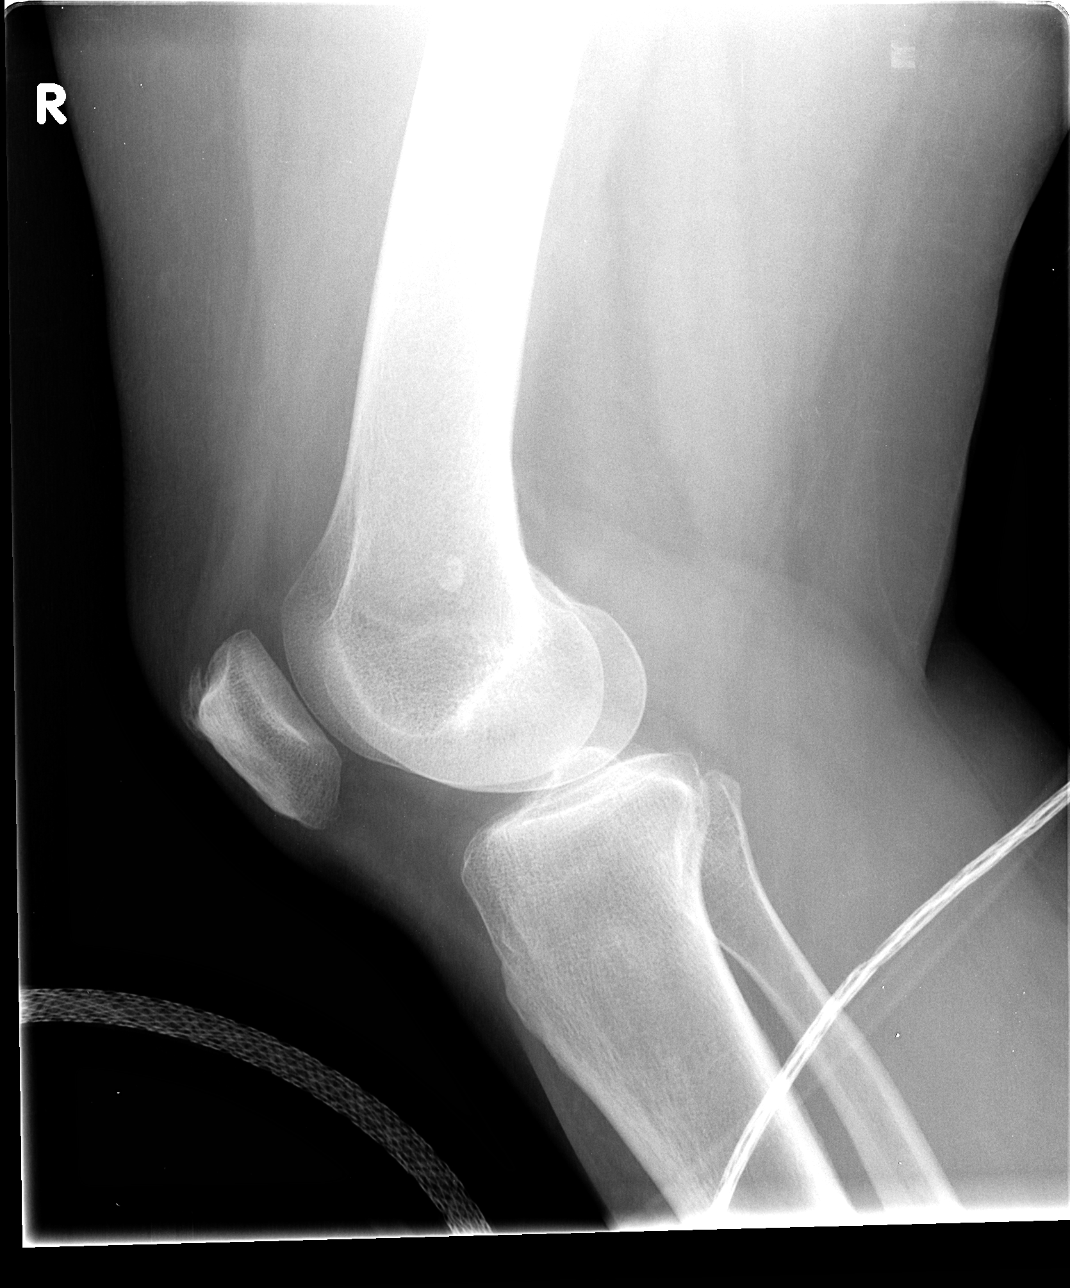

[4 of 4 positions shown; findings below may reference images not displayed]

FINDINGS: The bones of the right knee are adequately mineralized. There is no
acute fracture nor dislocation. The joint spaces appear well
maintained. There is minimal beaking of the tibial spines. The
proximal fibula is intact. There is no joint effusion.
IMPRESSION: There is no acute bony abnormality of the right knee.

## 2016-03-26 ENCOUNTER — Other Ambulatory Visit: Payer: Self-pay | Admitting: Neurology

## 2016-03-26 DIAGNOSIS — M545 Low back pain, unspecified: Secondary | ICD-10-CM

## 2016-03-26 DIAGNOSIS — G8929 Other chronic pain: Secondary | ICD-10-CM

## 2016-04-02 ENCOUNTER — Encounter (HOSPITAL_COMMUNITY): Payer: Self-pay | Admitting: *Deleted

## 2016-04-02 ENCOUNTER — Inpatient Hospital Stay (HOSPITAL_COMMUNITY)
Admission: EM | Admit: 2016-04-02 | Discharge: 2016-04-05 | DRG: 062 | Disposition: A | Discharge status: Home / Self Care | Payer: Medicare Other | Attending: Neurology | Admitting: Neurology

## 2016-04-02 ENCOUNTER — Emergency Department (HOSPITAL_COMMUNITY): Payer: Medicare Other

## 2016-04-02 DIAGNOSIS — G8929 Other chronic pain: Secondary | ICD-10-CM

## 2016-04-02 DIAGNOSIS — K219 Gastro-esophageal reflux disease without esophagitis: Secondary | ICD-10-CM | POA: Diagnosis present

## 2016-04-02 DIAGNOSIS — M545 Low back pain, unspecified: Secondary | ICD-10-CM

## 2016-04-02 DIAGNOSIS — R51 Headache: Secondary | ICD-10-CM

## 2016-04-02 DIAGNOSIS — G35 Multiple sclerosis: Secondary | ICD-10-CM | POA: Diagnosis present

## 2016-04-02 DIAGNOSIS — F419 Anxiety disorder, unspecified: Secondary | ICD-10-CM | POA: Diagnosis present

## 2016-04-02 DIAGNOSIS — I2510 Atherosclerotic heart disease of native coronary artery without angina pectoris: Secondary | ICD-10-CM | POA: Diagnosis present

## 2016-04-02 DIAGNOSIS — E785 Hyperlipidemia, unspecified: Secondary | ICD-10-CM | POA: Diagnosis present

## 2016-04-02 DIAGNOSIS — I6930 Unspecified sequelae of cerebral infarction: Secondary | ICD-10-CM

## 2016-04-02 DIAGNOSIS — I639 Cerebral infarction, unspecified: Principal | ICD-10-CM | POA: Diagnosis present

## 2016-04-02 DIAGNOSIS — Z7902 Long term (current) use of antithrombotics/antiplatelets: Secondary | ICD-10-CM

## 2016-04-02 DIAGNOSIS — G4700 Insomnia, unspecified: Secondary | ICD-10-CM | POA: Diagnosis present

## 2016-04-02 DIAGNOSIS — R531 Weakness: Secondary | ICD-10-CM

## 2016-04-02 DIAGNOSIS — F4310 Post-traumatic stress disorder, unspecified: Secondary | ICD-10-CM | POA: Diagnosis present

## 2016-04-02 DIAGNOSIS — G43909 Migraine, unspecified, not intractable, without status migrainosus: Secondary | ICD-10-CM

## 2016-04-02 DIAGNOSIS — R29810 Facial weakness: Secondary | ICD-10-CM | POA: Diagnosis present

## 2016-04-02 DIAGNOSIS — F319 Bipolar disorder, unspecified: Secondary | ICD-10-CM | POA: Diagnosis present

## 2016-04-02 DIAGNOSIS — F411 Generalized anxiety disorder: Secondary | ICD-10-CM

## 2016-04-02 DIAGNOSIS — Z87898 Personal history of other specified conditions: Secondary | ICD-10-CM

## 2016-04-02 DIAGNOSIS — Z8673 Personal history of transient ischemic attack (TIA), and cerebral infarction without residual deficits: Secondary | ICD-10-CM

## 2016-04-02 DIAGNOSIS — E876 Hypokalemia: Secondary | ICD-10-CM | POA: Diagnosis present

## 2016-04-02 DIAGNOSIS — I10 Essential (primary) hypertension: Secondary | ICD-10-CM | POA: Diagnosis present

## 2016-04-02 DIAGNOSIS — G629 Polyneuropathy, unspecified: Secondary | ICD-10-CM

## 2016-04-02 DIAGNOSIS — J449 Chronic obstructive pulmonary disease, unspecified: Secondary | ICD-10-CM | POA: Diagnosis present

## 2016-04-02 DIAGNOSIS — H53149 Visual discomfort, unspecified: Secondary | ICD-10-CM | POA: Diagnosis present

## 2016-04-02 DIAGNOSIS — G441 Vascular headache, not elsewhere classified: Secondary | ICD-10-CM | POA: Diagnosis not present

## 2016-04-02 DIAGNOSIS — G8194 Hemiplegia, unspecified affecting left nondominant side: Secondary | ICD-10-CM | POA: Diagnosis present

## 2016-04-02 DIAGNOSIS — F17210 Nicotine dependence, cigarettes, uncomplicated: Secondary | ICD-10-CM | POA: Diagnosis present

## 2016-04-02 DIAGNOSIS — Z8711 Personal history of peptic ulcer disease: Secondary | ICD-10-CM

## 2016-04-02 DIAGNOSIS — I679 Cerebrovascular disease, unspecified: Secondary | ICD-10-CM

## 2016-04-02 DIAGNOSIS — G2581 Restless legs syndrome: Secondary | ICD-10-CM | POA: Diagnosis present

## 2016-04-02 DIAGNOSIS — R519 Headache, unspecified: Secondary | ICD-10-CM

## 2016-04-02 LAB — CBC
HCT: 40 % (ref 36.0–46.0)
Hemoglobin: 13.3 g/dL (ref 12.0–15.0)
MCH: 30 pg (ref 26.0–34.0)
MCHC: 33.3 g/dL (ref 30.0–36.0)
MCV: 90.1 fL (ref 78.0–100.0)
PLATELETS: 265 10*3/uL (ref 150–400)
RBC: 4.44 MIL/uL (ref 3.87–5.11)
RDW: 14.4 % (ref 11.5–15.5)
WBC: 11.6 10*3/uL — ABNORMAL HIGH (ref 4.0–10.5)

## 2016-04-02 MED ORDER — ONDANSETRON HCL 4 MG/2ML IJ SOLN
4.0000 mg | Freq: Once | INTRAMUSCULAR | Status: AC
  Administered 2016-04-03: 4 mg via INTRAVENOUS
  Filled 2016-04-02: qty 2

## 2016-04-02 MED ORDER — MORPHINE SULFATE (PF) 4 MG/ML IV SOLN
4.0000 mg | Freq: Once | INTRAVENOUS | Status: AC
  Administered 2016-04-03: 4 mg via INTRAVENOUS
  Filled 2016-04-02: qty 1

## 2016-04-02 MED ORDER — HYDRALAZINE HCL 20 MG/ML IJ SOLN
10.0000 mg | Freq: Once | INTRAMUSCULAR | Status: AC
  Administered 2016-04-03: 10 mg via INTRAVENOUS
  Filled 2016-04-02: qty 1

## 2016-04-02 NOTE — ED Triage Notes (Signed)
Pt c/o intermittent left sided chest pain around 10:15 pm. Pt also reports left arm numbness, left hand pain, and nausea.

## 2016-04-02 NOTE — ED Notes (Signed)
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

## 2016-04-02 NOTE — ED Provider Notes (Signed)
By signing my name below, I, Ephriam Jenkins, attest that this documentation has been prepared under the direction and in the presence of [place], DO. Electronically signed, Ephriam Jenkins, ED Scribe. 04/02/16. 11:44 PM.  TIME SEEN: 11:26PM  CHIEF COMPLAINT: Left sided numbness/chest pain  HPI:  HPI Comments: Katrina [name] is a 55 y.o. female with Hx of 3 CVA, HTN, COPD, CAD, who presents to the Emergency Department complaining of gradual onset left sided numbness with  left sided chest pain, shortness of breath and diaphoresis that started at approximately 2230 this evening. Pt was sitting at home watching TV when she started to have pain in the left side of her chest with some shortness of breath nausea, lightheadedness and diaphoresis. She describes this pain as chest tightness and telling someone was squeezing her heart. Pt states that her chest pain then subsided and started to have a pain described as "burning" to her left hand that radiated up to her shoulder. She also describes numbness to her left face, left upper extremity and left lower extremity. She has a baseline left sided weakness but denies any change in her strength. She denies any chest pain currently. States it resolved spontaneously. She has Hx of 3 stroke's and reports that she had TPA in April. Pt is prescribed Plavix 75mg  and reports that she has been taking it as directed. Pt's last cardiac cath was 01/2015 which showed clean coronary arteries. She is a current smoker. She denies headache, head injury.  ROS: See HPI Constitutional: no fever  Eyes: no drainage  ENT: no runny nose   Cardiovascular:  chest pain  Resp: SOB  GI: no vomiting GU: no dysuria Integumentary: no rash  Allergy: no hives  Musculoskeletal: no leg swelling  Neurological: no slurred speech ROS otherwise negative  PAST MEDICAL HISTORY/PAST SURGICAL HISTORY:  Past Medical History:  Diagnosis Date  . Anxiety    takes Valium daily as needed  . Asthma     has inhalers but doesn't use  . CAD (coronary artery disease)    Moderate nonobstructive 2012-2013, [place]  . Chronic back pain    budlging disc   . COPD (chronic obstructive pulmonary disease) ([place])   . Diverticulitis   . Family history of adverse reaction to anesthesia    daughter gets extremely sick   . GERD (gastroesophageal reflux disease)    takes Dexilant daily  . History of blood transfusion 1975/1976   no abnormal reaction noted  . History of bronchitis 06/2015  . History of colitis   . History of colon polyps    benign  . History of gastric ulcer   . History of migraine    last one a few days ago  . History of MRSA infection   . Hyperlipidemia    was on meds but has been off over a yr  . Hypertension    takes Amlodipine and Maxzide daily  . Insomnia    takes Ambien nightly  . Lung nodules   . Microscopic hematuria   . MS (multiple sclerosis) ([place])    questionable per pt  . Peripheral neuropathy (HCC)    weakness,numbness,and tingling. Takes Gabapentin daily  . Pneumonia 06/2015  . Restless leg syndrome    takes Requip daily  . Stroke [facility])    takes Plavix daily.Left sided weakness    MEDICATIONS:  Prior to Admission medications   Medication Sig Start Date End Date Taking? Authorizing Provider  amLODipine (NORVASC) 10 MG tablet Take 1  tablet (10 mg total) by mouth daily. 06/05/14   Herminio Commons, MD  clopidogrel (PLAVIX) 75 MG tablet Take 75 mg by mouth daily.    Historical Provider, MD  dexlansoprazole (DEXILANT) 60 MG capsule Take 1 capsule (60 mg total) by mouth every evening. 03/20/14   Rexene Alberts, MD  gabapentin (NEURONTIN) 600 MG tablet Take 600 mg by mouth 4 (four) times daily. 07/05/15   Historical Provider, MD  rOPINIRole (REQUIP) 4 MG tablet Take 4 mg by mouth at bedtime.    Historical Provider, MD  triamterene-hydrochlorothiazide (MAXZIDE-25) 37.5-25 MG tablet  01/01/16   Historical Provider, MD  zolpidem (AMBIEN) 10 MG tablet Take 1  tablet (10 mg total) by mouth at bedtime. 03/20/14   Rexene Alberts, MD    ALLERGIES:  Allergies  Allergen Reactions  . Penicillins Anaphylaxis    Has patient had a PCN reaction causing immediate rash, facial/tongue/throat swelling, SOB or lightheadedness with hypotension: Yes Has patient had a PCN reaction causing severe rash involving mucus membranes or skin necrosis: Yes Has patient had a PCN reaction that required hospitalization Yes Has patient had a PCN reaction occurring within the last 10 years: No If all of the above answers are "NO", then may proceed with Cephalosporin use.   . Zithromax [Azithromycin] Anaphylaxis  . Aspirin Other (See Comments)    Due to stomach ulcers.   . Pineapple Rash  . Strawberry Extract Rash  . Aspartame And Phenylalanine Palpitations  . Mushroom Extract Complex Rash  . Nicardipine Nausea And Vomiting and Other (See Comments)    shaking  . Poultry Meal Other (See Comments)    Allergic to [place]    SOCIAL HISTORY:  Social History  Substance Use Topics  . Smoking status: Current Every Day Smoker    Packs/day: 2.00    Years: 44.00    Types: Cigarettes    Start date: 03/04/1971  . Smokeless tobacco: Current User     Comment: Vapor Cigs  . Alcohol use No     Comment: 10-06-15 per pt no     FAMILY HISTORY: Family History  Problem Relation Age of Onset  . Coronary artery disease Father 7  . Emphysema Father   . Depression Mother   . Bipolar disorder Brother   . Drug abuse Brother   . Bipolar disorder Daughter     EXAM: BP (!) 180/114 (BP Location: Left Arm)   Pulse 114   Temp 98.2 F (36.8 C) (Oral)   Resp 24   Ht 5\' 6"  (1.676 m)   Wt 214 lb (97.1 kg)   SpO2 100%   BMI 34.54 kg/m  CONSTITUTIONAL: Alert and oriented and responds appropriately to questions. Chronically ill appearing. well-nourished HEAD: Normocephalic EYES: Conjunctivae clear, PERRL ENT: normal nose; no rhinorrhea; moist mucous membranes NECK: Supple, no  meningismus, no LAD  CARD: RRR; S1 and S2 appreciated; no murmurs, no clicks, no rubs, no gallops RESP: Normal chest excursion without splinting or tachypnea; breath sounds clear and equal bilaterally; no wheezes, no rhonchi, no rales, no hypoxia or respiratory distress, speaking full sentences ABD/GI: Normal bowel sounds; non-distended; soft, non-tender, no rebound, no guarding, no peritoneal signs BACK:  The back appears normal and is non-tender to palpation, there is no CVA tenderness EXT: Normal ROM in all joints; non-tender to palpation; no edema; normal capillary refill; no cyanosis, no calf tenderness or swelling    SKIN: Normal color for age and race; warm; no rash NEURO: Decreased sensation in the left face,  arm and leg compared to the right which is new for her. Normal sensation on the right. Decreased strength on the left upper and lower extremity (4/5 in arm, 3/5 in leg) that is chronic and unchanged. 5/5 strength in the right upper and lower extremity. New left sided facial droop.  Pt has a chronic left sided gaze palsy.  Otherwise cranial nerves 2-12 intact. No dysphasia no dysarthria. Pt able to stand.  NIHSS 3. PSYCH: The patient's mood and manner are appropriate. Grooming and personal hygiene are appropriate.  MEDICAL DECISION MAKING: Patient here with left-sided chest pain that occurred at 10:30 PM that is now resolved. Didn't start having left-sided arm pain which has developed into numbness and nontender left side of her body and a new facial droop. Last seen normal 10:30 PM. Has chronic weakness in his left side from previous stroke. She is extremely hypertensive today. We'll give IV hydralazine. Code stroke has been initiated. NIH stroke scale is 3 (for new symptoms).  Will obtain labs, urine, head CT, CXR and have tele neuro evaluate patient.  ED PROGRESS: 12:20 AM  Pt's BP has improved. Head CT is negative. Labs have been unremarkable. Tele neurology is currently evaluating  patient.  12:40 AM  Pt evaluated by Dr. Kathrin Penner with tele neurology.  BP is 152/76.  Neuro recommends giving TPA. Patient has no current contraindications. Risks and benefits have been discussed with patient.  Discussed with Dr. Leonel Ramsay with neurology at [facility] who agrees to accept patient to the ICU.  1:15 AM  Pt's blood pressure dropped somewhat after IV hydralazine. Improving with IV hydration. No new complaints. Awaiting ICU bed at [facility] cone for transfer.  3:30 AM  Pt has a bed assigned at [facility].  TPA is done infusing.  Still hemodynamically stable. No significant change in her neurologic status.    EKG Interpretation  Date/Time:  Friday April 02 2016 22:55:04 EDT Ventricular Rate:  93 PR Interval:    QRS Duration: 107 QT Interval:  404 QTC Calculation: 503 R Axis:   64 Text Interpretation:  Sinus rhythm Atrial premature complexes ST elevation, consider inferior injury Borderline prolonged QT interval wandering baseline, technically difficulty no acute ischemic changes simiarlt o prior EKG  Confirmed by LIU MD, DANA [id number]) on 04/02/2016 10:57:39 PM      CRITICAL CARE Performed by: Nyra Jabs   Total critical care time: 45 minutes  Critical care time was exclusive of separately billable procedures and treating other patients.  Critical care was necessary to treat or prevent imminent or life-threatening deterioration.  Critical care was time spent personally by me on the following activities: development of treatment plan with patient and/or surrogate as well as nursing, discussions with consultants, evaluation of patient's response to treatment, examination of patient, obtaining history from patient or surrogate, ordering and performing treatments and interventions, ordering and review of laboratory studies, ordering and review of radiographic studies, pulse oximetry and re-evaluation of patient's condition.  I personally performed the services described  in this documentation, which was scribed in my presence. The recorded information has been reviewed and is accurate.     [place], DO 04/03/16 [id number]

## 2016-04-02 NOTE — ED Notes (Signed)
NIH inturrupted by phone call from assessment for neuro

## 2016-04-03 ENCOUNTER — Encounter (HOSPITAL_COMMUNITY): Payer: Self-pay | Admitting: *Deleted

## 2016-04-03 ENCOUNTER — Inpatient Hospital Stay (HOSPITAL_COMMUNITY): Payer: Medicare Other

## 2016-04-03 DIAGNOSIS — J449 Chronic obstructive pulmonary disease, unspecified: Secondary | ICD-10-CM | POA: Diagnosis present

## 2016-04-03 DIAGNOSIS — Z8711 Personal history of peptic ulcer disease: Secondary | ICD-10-CM | POA: Diagnosis not present

## 2016-04-03 DIAGNOSIS — G8194 Hemiplegia, unspecified affecting left nondominant side: Secondary | ICD-10-CM | POA: Diagnosis present

## 2016-04-03 DIAGNOSIS — I6789 Other cerebrovascular disease: Secondary | ICD-10-CM | POA: Diagnosis not present

## 2016-04-03 DIAGNOSIS — E785 Hyperlipidemia, unspecified: Secondary | ICD-10-CM | POA: Diagnosis present

## 2016-04-03 DIAGNOSIS — F319 Bipolar disorder, unspecified: Secondary | ICD-10-CM | POA: Diagnosis present

## 2016-04-03 DIAGNOSIS — E876 Hypokalemia: Secondary | ICD-10-CM | POA: Diagnosis present

## 2016-04-03 DIAGNOSIS — I2510 Atherosclerotic heart disease of native coronary artery without angina pectoris: Secondary | ICD-10-CM | POA: Diagnosis present

## 2016-04-03 DIAGNOSIS — F4310 Post-traumatic stress disorder, unspecified: Secondary | ICD-10-CM | POA: Diagnosis present

## 2016-04-03 DIAGNOSIS — I679 Cerebrovascular disease, unspecified: Secondary | ICD-10-CM | POA: Diagnosis not present

## 2016-04-03 DIAGNOSIS — H53149 Visual discomfort, unspecified: Secondary | ICD-10-CM | POA: Diagnosis present

## 2016-04-03 DIAGNOSIS — M545 Low back pain: Secondary | ICD-10-CM | POA: Diagnosis present

## 2016-04-03 DIAGNOSIS — G441 Vascular headache, not elsewhere classified: Secondary | ICD-10-CM | POA: Diagnosis not present

## 2016-04-03 DIAGNOSIS — Z7902 Long term (current) use of antithrombotics/antiplatelets: Secondary | ICD-10-CM | POA: Diagnosis not present

## 2016-04-03 DIAGNOSIS — R29810 Facial weakness: Secondary | ICD-10-CM | POA: Diagnosis present

## 2016-04-03 DIAGNOSIS — K219 Gastro-esophageal reflux disease without esophagitis: Secondary | ICD-10-CM | POA: Diagnosis present

## 2016-04-03 DIAGNOSIS — Z8673 Personal history of transient ischemic attack (TIA), and cerebral infarction without residual deficits: Secondary | ICD-10-CM | POA: Diagnosis not present

## 2016-04-03 DIAGNOSIS — F419 Anxiety disorder, unspecified: Secondary | ICD-10-CM | POA: Diagnosis present

## 2016-04-03 DIAGNOSIS — I6300 Cerebral infarction due to thrombosis of unspecified precerebral artery: Secondary | ICD-10-CM | POA: Diagnosis not present

## 2016-04-03 DIAGNOSIS — I10 Essential (primary) hypertension: Secondary | ICD-10-CM | POA: Diagnosis present

## 2016-04-03 DIAGNOSIS — G43909 Migraine, unspecified, not intractable, without status migrainosus: Secondary | ICD-10-CM | POA: Diagnosis present

## 2016-04-03 DIAGNOSIS — I639 Cerebral infarction, unspecified: Principal | ICD-10-CM

## 2016-04-03 DIAGNOSIS — R531 Weakness: Secondary | ICD-10-CM | POA: Diagnosis not present

## 2016-04-03 DIAGNOSIS — G35 Multiple sclerosis: Secondary | ICD-10-CM | POA: Diagnosis present

## 2016-04-03 DIAGNOSIS — G4700 Insomnia, unspecified: Secondary | ICD-10-CM | POA: Diagnosis present

## 2016-04-03 DIAGNOSIS — F17210 Nicotine dependence, cigarettes, uncomplicated: Secondary | ICD-10-CM | POA: Diagnosis present

## 2016-04-03 DIAGNOSIS — G629 Polyneuropathy, unspecified: Secondary | ICD-10-CM | POA: Diagnosis present

## 2016-04-03 DIAGNOSIS — G2581 Restless legs syndrome: Secondary | ICD-10-CM | POA: Diagnosis present

## 2016-04-03 LAB — COMPREHENSIVE METABOLIC PANEL
ALBUMIN: 3.8 g/dL (ref 3.5–5.0)
ALBUMIN: 3.8 g/dL (ref 3.5–5.0)
ALK PHOS: 82 U/L (ref 38–126)
ALT: 15 U/L (ref 14–54)
ALT: 16 U/L (ref 14–54)
ANION GAP: 7 (ref 5–15)
AST: 17 U/L (ref 15–41)
AST: 18 U/L (ref 15–41)
Alkaline Phosphatase: 81 U/L (ref 38–126)
Anion gap: 5 (ref 5–15)
BUN: 18 mg/dL (ref 6–20)
BUN: 19 mg/dL (ref 6–20)
CALCIUM: 8.5 mg/dL — AB (ref 8.9–10.3)
CHLORIDE: 107 mmol/L (ref 101–111)
CO2: 23 mmol/L (ref 22–32)
CO2: 25 mmol/L (ref 22–32)
CREATININE: 0.74 mg/dL (ref 0.44–1.00)
Calcium: 8.5 mg/dL — ABNORMAL LOW (ref 8.9–10.3)
Chloride: 108 mmol/L (ref 101–111)
Creatinine, Ser: 0.76 mg/dL (ref 0.44–1.00)
GFR calc Af Amer: >60 mL/min (ref >60)
GFR calc non Af Amer: >60 mL/min (ref >60)
GFR calc non Af Amer: >60 mL/min (ref >60)
GLUCOSE: 102 mg/dL — AB (ref 65–99)
Glucose, Bld: 103 mg/dL — ABNORMAL HIGH (ref 65–99)
Potassium: 3 mmol/L — ABNORMAL LOW (ref 3.5–5.1)
Potassium: 3.1 mmol/L — ABNORMAL LOW (ref 3.5–5.1)
SODIUM: 138 mmol/L (ref 135–145)
Sodium: 137 mmol/L (ref 135–145)
Total Bilirubin: 0.3 mg/dL (ref 0.3–1.2)
Total Bilirubin: 0.3 mg/dL (ref 0.3–1.2)
Total Protein: 7 g/dL (ref 6.5–8.1)
Total Protein: 7.1 g/dL (ref 6.5–8.1)

## 2016-04-03 LAB — PROTIME-INR
INR: 1
Prothrombin Time: 13.2 seconds (ref 11.4–15.2)

## 2016-04-03 LAB — CBC
HEMATOCRIT: 40.1 % (ref 36.0–46.0)
Hemoglobin: 12.8 g/dL (ref 12.0–15.0)
MCH: 29.3 pg (ref 26.0–34.0)
MCHC: 31.9 g/dL (ref 30.0–36.0)
MCV: 91.8 fL (ref 78.0–100.0)
Platelets: 236 10*3/uL (ref 150–400)
RBC: 4.37 MIL/uL (ref 3.87–5.11)
RDW: 15.3 % (ref 11.5–15.5)
WBC: 8.9 10*3/uL (ref 4.0–10.5)

## 2016-04-03 LAB — URINE MICROSCOPIC-ADD ON
BACTERIA UA: NONE SEEN
Squamous Epithelial / LPF: NONE SEEN
WBC UA: NONE SEEN WBC/hpf (ref 0–5)

## 2016-04-03 LAB — BASIC METABOLIC PANEL
Anion gap: 7 (ref 5–15)
BUN: 14 mg/dL (ref 6–20)
CALCIUM: 8.8 mg/dL — AB (ref 8.9–10.3)
CO2: 22 mmol/L (ref 22–32)
Chloride: 113 mmol/L — ABNORMAL HIGH (ref 101–111)
Creatinine, Ser: 0.71 mg/dL (ref 0.44–1.00)
GFR calc Af Amer: >60 mL/min (ref >60)
GLUCOSE: 101 mg/dL — AB (ref 65–99)
Potassium: 4.1 mmol/L (ref 3.5–5.1)
Sodium: 142 mmol/L (ref 135–145)

## 2016-04-03 LAB — RAPID URINE DRUG SCREEN, HOSP PERFORMED
AMPHETAMINES: NOT DETECTED
BARBITURATES: NOT DETECTED
BENZODIAZEPINES: POSITIVE — AB
Cocaine: NOT DETECTED
Opiates: POSITIVE — AB
TETRAHYDROCANNABINOL: NOT DETECTED

## 2016-04-03 LAB — APTT: APTT: 30 s (ref 24–36)

## 2016-04-03 LAB — ETHANOL: ALCOHOL ETHYL (B): 10 mg/dL — AB (ref <5)

## 2016-04-03 LAB — TROPONIN I
Troponin I: <0.03 ng/mL (ref <0.03)
Troponin I: <0.03 ng/mL (ref <0.03)

## 2016-04-03 LAB — URINALYSIS, ROUTINE W REFLEX MICROSCOPIC
BILIRUBIN URINE: NEGATIVE
Glucose, UA: NEGATIVE mg/dL
KETONES UR: NEGATIVE mg/dL
Leukocytes, UA: NEGATIVE
Nitrite: NEGATIVE
Specific Gravity, Urine: 1.02 (ref 1.005–1.030)
pH: 6 (ref 5.0–8.0)

## 2016-04-03 LAB — MRSA PCR SCREENING: MRSA by PCR: NEGATIVE

## 2016-04-03 LAB — BRAIN NATRIURETIC PEPTIDE: B NATRIURETIC PEPTIDE 5: 24 pg/mL (ref 0.0–100.0)

## 2016-04-03 MED ORDER — SODIUM CHLORIDE 0.9 % IV SOLN: INTRAVENOUS | Status: DC

## 2016-04-03 MED ORDER — ACETAMINOPHEN 325 MG PO TABS: 650.0000 mg | ORAL_TABLET | ORAL | Status: DC | PRN

## 2016-04-03 MED ORDER — POTASSIUM CHLORIDE 20 MEQ PO PACK
20.0000 meq | PACK | Freq: Two times a day (BID) | ORAL | Status: DC
  Administered 2016-04-03: 20 meq via ORAL
  Filled 2016-04-03 (×2): qty 1

## 2016-04-03 MED ORDER — STROKE: EARLY STAGES OF RECOVERY BOOK
Freq: Once | Status: DC
  Filled 2016-04-03: qty 1

## 2016-04-03 MED ORDER — PROPRANOLOL HCL ER 60 MG PO CP24
60.0000 mg | ORAL_CAPSULE | Freq: Every day | ORAL | Status: DC
  Administered 2016-04-03 – 2016-04-04 (×2): 60 mg via ORAL
  Filled 2016-04-03 (×3): qty 1

## 2016-04-03 MED ORDER — ALTEPLASE 100 MG IV SOLR
INTRAVENOUS | Status: AC
  Filled 2016-04-03: qty 100

## 2016-04-03 MED ORDER — ALTEPLASE (STROKE) FULL DOSE INFUSION
0.9000 mg/kg | Freq: Once | INTRAVENOUS | Status: AC
  Administered 2016-04-03: 87 mg via INTRAVENOUS
  Filled 2016-04-03: qty 100

## 2016-04-03 MED ORDER — METOCLOPRAMIDE HCL 5 MG/ML IJ SOLN: 10.0000 mg | Freq: Once | INTRAMUSCULAR | Status: DC

## 2016-04-03 MED ORDER — GABAPENTIN 600 MG PO TABS
600.0000 mg | ORAL_TABLET | Freq: Three times a day (TID) | ORAL | Status: DC
  Administered 2016-04-03 – 2016-04-05 (×10): 600 mg via ORAL
  Filled 2016-04-03 (×12): qty 1

## 2016-04-03 MED ORDER — FAMOTIDINE IN NACL 20-0.9 MG/50ML-% IV SOLN
20.0000 mg | Freq: Two times a day (BID) | INTRAVENOUS | Status: DC
  Administered 2016-04-03 (×2): 20 mg via INTRAVENOUS
  Filled 2016-04-03 (×2): qty 50

## 2016-04-03 MED ORDER — ACETAMINOPHEN 650 MG RE SUPP: 650.0000 mg | RECTAL | Status: DC | PRN

## 2016-04-03 MED ORDER — SODIUM CHLORIDE 0.9 % IV BOLUS (SEPSIS)
1000.0000 mL | Freq: Once | INTRAVENOUS | Status: AC
Start: 2016-04-03 — End: 2016-04-03
  Administered 2016-04-03: 1000 mL via INTRAVENOUS

## 2016-04-03 MED ORDER — NICOTINE 14 MG/24HR TD PT24
14.0000 mg | MEDICATED_PATCH | TRANSDERMAL | Status: DC
  Administered 2016-04-03 – 2016-04-05 (×3): 14 mg via TRANSDERMAL
  Filled 2016-04-03 (×3): qty 1

## 2016-04-03 MED ORDER — SODIUM CHLORIDE 0.9 % IV SOLN
50.0000 mL | Freq: Once | INTRAVENOUS | Status: AC
  Administered 2016-04-03: 50 mL via INTRAVENOUS

## 2016-04-03 MED ORDER — ROPINIROLE HCL 1 MG PO TABS
4.0000 mg | ORAL_TABLET | Freq: Every day | ORAL | Status: DC
  Administered 2016-04-03 – 2016-04-04 (×2): 4 mg via ORAL
  Filled 2016-04-03 (×2): qty 4

## 2016-04-03 MED ORDER — OXYCODONE-ACETAMINOPHEN 7.5-325 MG PO TABS: 1.0000 | ORAL_TABLET | Freq: Three times a day (TID) | ORAL | Status: DC | PRN

## 2016-04-03 NOTE — ED Notes (Signed)
8.8 mg bolus of Alteplase given per Dr Mickle Plumb.

## 2016-04-03 NOTE — ED Notes (Signed)
Awaiting bed assignment.

## 2016-04-03 NOTE — ED Notes (Addendum)
Patient transported to CT 

## 2016-04-03 NOTE — H&P (Signed)
Neurology H&P  CC: Left-sided numbness  History is obtained from: Patient  HPI: Katrina [name] is a 55 y.o. female who was in her normal state of health until around 10:30 PM. She then developed chest pain which was transient. Was not, however, was left-sided weakness and numbness over and above what she typically has from her previous stroke. She reports a stroke in April, however on review of the records there is question of whether this actually represents multiple sclerosis.  She has had multiple admissions for left-sided weakness without clear imaging findings to support the diagnosis of stroke.  She presented to the [facility] emergency department where she was evaluated by a tele-neurology service and the decision was made to give her IV TPA. She has therefore been transferred to [facility] for further evaluation.  LKW: 10:30 PM tpa given?: Yes   ROS: A 14 point ROS was performed and is negative except as noted in the HPI.   Past Medical History:  Diagnosis Date  . Anginal pain ([place])   . Anxiety    takes Valium daily as needed  . Asthma    has inhalers but doesn't use  . CAD (coronary artery disease)    Moderate nonobstructive 2012-2013, [place]  . Chronic back pain    budlging disc   . COPD (chronic obstructive pulmonary disease) ([place])   . Diverticulitis   . Family history of adverse reaction to anesthesia    daughter gets extremely sick   . GERD (gastroesophageal reflux disease)    takes Dexilant daily  . History of blood transfusion 1975/1976   no abnormal reaction noted  . History of bronchitis 06/2015  . History of colitis   . History of colon polyps    benign  . History of gastric ulcer   . History of migraine    last one a few days ago  . History of MRSA infection   . Hyperlipidemia    was on meds but has been off over a yr  . Hypertension    takes Amlodipine and Maxzide daily  . Insomnia    takes Ambien nightly  . Lung nodules   .  Microscopic hematuria   . MS (multiple sclerosis) ([place])    questionable per pt  . Peripheral neuropathy (HCC)    weakness,numbness,and tingling. Takes Gabapentin daily  . Pneumonia 06/2015  . Restless leg syndrome    takes Requip daily  . Stroke [facility])    takes Plavix daily.Left sided weakness     Family History  Problem Relation Age of Onset  . Coronary artery disease Father 5  . Emphysema Father   . Depression Mother   . Bipolar disorder Brother   . Drug abuse Brother   . Bipolar disorder Daughter      Social History:  reports that she has been smoking Cigarettes.  She started smoking about 45 years ago. She has a 88.00 pack-year smoking history. She uses smokeless tobacco. She reports that she does not drink alcohol or use drugs.   Exam: Current vital signs: BP (!) 145/67 (BP Location: Left Arm)   Pulse 80   Temp 98.3 F (36.8 C) (Oral)   Resp 20   Ht 5\' 6"  (1.676 m)   Wt 98.2 kg (216 lb 7.9 oz)   SpO2 100%   BMI 34.94 kg/m  Vital signs in last 24 hours: Temp:  [98 F (36.7 C)-98.3 F (36.8 C)] 98.3 F (36.8 C) (10/28 0533) Pulse Rate:  [70-114]  80 (10/28 0533) Resp:  [15-31] 20 (10/28 0533) BP: (90-205)/(49-114) 145/67 (10/28 0533) SpO2:  [90 %-100 %] 100 % (10/28 0533) Weight:  [97.1 kg (214 lb)-98.2 kg (216 lb 7.9 oz)] 98.2 kg (216 lb 7.9 oz) (10/28 0533)   Physical Exam  Constitutional: Appears well-developed and well-nourished.  Psych: Affect appropriate to situation Eyes: No scleral injection HENT: No OP obstrucion Head: Normocephalic.  Cardiovascular: Normal rate and regular rhythm.  Respiratory: Effort normal and breath sounds normal to anterior ascultation GI: Soft.  No distension. There is no tenderness.  Skin: WDI  Neuro: Mental Status: Patient is awake, alert, oriented to person, place, month, year, and situation. Patient is able to give a clear and coherent history. No signs of aphasia or neglect Cranial Nerves: II: She has a left  lower field cut. Pupils are equal, round, and reactive to light.   III,IV, VI: EOMI without ptosis or diploplia.  V: Facial sensation is decreased on the left VII: Facial movement is notable for left facial weakness VIII: hearing is intact to voice X: Uvula elevates symmetrically XI: Shoulder shrug is symmetric. XII: tongue is midline without atrophy or fasciculations.  Motor: Tone is normal. Bulk is normal. She has 3/5 weakness of the left upper extremity, 1/5 in the left lower extremity Sensory: Sensation is decreased on the left Cerebellar: FNF and HKS are intact on the right side, unable to perform on the left   I have reviewed labs in epic and the results pertinent to this consultation are: CMP-mild hypokalemia  I have reviewed the images obtained: CT head-no acute findings  Impression: 55 year old female with a history of questionable multiple sclerosis who presents with left-sided weakness, numbness, pain. At this time, the diagnosis is unclear but she has received IV TPA and  The acute onset of weakness and numbness is most consistent with stroke, faster than is typical with MS  Recommendations: 1. HgbA1c, fasting lipid panel 2. MRI of the brain with/without contrast 3. Frequent neuro checks 4. Echocardiogram 5. CT angio head and neck.  6. Prophylactic none for 24 hours 7. Risk factor modification 8. Telemetry monitoring 9. PT consult, OT consult, Speech consult 10. please page stroke NP  Or  PA  Or MD  from 8am -4 pm starting 10/28 as this patient will be followed by the stroke team at this point.   You can look them up on [email or web address]     Roland Rack, MD [facility] [phone]  If 7pm- 7am, please page neurology on call as listed in [place].

## 2016-04-03 NOTE — Progress Notes (Signed)
PT Cancellation Note  Patient Details Name: Katrina [name] MRN: [id number] DOB: 03-12-1961   Cancelled Treatment:    Reason Eval/Treat Not Completed: Patient not medically ready .  Pt on bedrest most of the day.  Will see as able 10/28. 04/03/2016  Donnella Sham, PT [phone]  (pager)  Mccartney Brucks, Tessie Fass 04/03/2016, 3:59 PM

## 2016-04-03 NOTE — ED Notes (Addendum)
Security in to speak with pt regarding her car (pt drove to ED)- Her daughter is called  [phone] [facility]- Daughter- message left on phone

## 2016-04-03 NOTE — Progress Notes (Signed)
Emotional reassurance offered in light of patient's agitation related to desire to go home. Katrina [name] continues to speak of going AMA. I reviewed plan of care including impending test and risk related to tPa.Katrina [name] is now calm and willing to stay until test are completed.

## 2016-04-03 NOTE — ED Notes (Signed)
Pt reports that she is doing better.  

## 2016-04-03 NOTE — ED Notes (Signed)
Pt reports 3 previous strokes with the last in May of this year- She reports a previous use of TPA in May and reports that a stroke previous to the one in May rendered her an inpt at [facility] for 15 days

## 2016-04-03 NOTE — ED Notes (Signed)
Pt reading aloud is a bit clearer- She reports that her sensation on the left side continues to be diminished

## 2016-04-03 NOTE — ED Notes (Signed)
Pt is A&O, answers questions appropriately- She is informed that transport is enroute. She states that she is ok- lights are dimmed for her comfort and she is positioned for her comfort

## 2016-04-03 NOTE — ED Notes (Signed)
Pt reading sentences appears clearer- Pt reports that she feels that she is clearer, but not at baseline. She request her sleep meds- EDP declines and pt informed that she will have a neuro eval done when transferred and she will need to be as clear as possible - she verbalizes understanding

## 2016-04-03 NOTE — ED Notes (Signed)
teleneuro in progress 

## 2016-04-03 NOTE — ED Notes (Addendum)
Neuro speaking to Dr Leonides Schanz regarding Tpa- Neuro is Dr Kathrin Penner

## 2016-04-03 NOTE — ED Notes (Signed)
report to [place], [place]

## 2016-04-03 NOTE — ED Notes (Addendum)
Neuro spoke to pt regarding TPa and pt verbally agrees

## 2016-04-03 NOTE — ED Notes (Signed)
TPA has infused and is completed- pt reports feeling "good"

## 2016-04-03 NOTE — ED Notes (Addendum)
Pt reports that her physician is pleased with her BP of over 200s- She says that if it is around 180 then the physicians are please- if lower , "I hit the floor" per her report

## 2016-04-03 NOTE — ED Notes (Signed)
Pt continues in CT 

## 2016-04-03 NOTE — ED Notes (Signed)
Pt informs physicians that she has blood in her urine a couple of weeks ago

## 2016-04-03 NOTE — ED Notes (Signed)
Warm blanket given to pt as she reports being cold

## 2016-04-03 NOTE — Progress Notes (Signed)
STROKE TEAM PROGRESS NOTE   HISTORY OF PRESENT ILLNESS (per record) Katrina [name] is a 55 y.o. female who was in her normal state of health until around 10:30 PM. She then developed chest pain which was transient. Was not, however, was left-sided weakness and numbness over and above what she typically has from her previous stroke. She reports a stroke in April, however on review of the records there is question of whether this actually represents multiple sclerosis.  She has had multiple admissions for left-sided weakness without clear imaging findings to support the diagnosis of stroke.  She presented to the [facility] emergency department where she was evaluated by a tele-neurology service and the decision was made to give her IV TPA. She has therefore been transferred to [facility] for further evaluation.  LKW: 10:30 PM tpa given?: Yes   SUBJECTIVE (INTERVAL HISTORY) S/P IV t-PA at midnight; developed severe headache this am.  Stat repeat CT Brain done which I reviewed and showed no ischemic infarct and no ICH.  She does have a history of migraines for many years but triptans were taken away due to uncontrolled hypertension.  She does not recall being given any migraine preventatives in the past.  Currently has a left temporal and parietal throbbing pain of 9/10 severity associated with photophobia, nausea, and aggravation with movement.     OBJECTIVE Temp:  [98 F (36.7 C)-98.3 F (36.8 C)] 98.2 F (36.8 C) (10/28 0800) Pulse Rate:  [70-114] 74 (10/28 0900) Cardiac Rhythm: Normal sinus rhythm (10/28 0800) Resp:  [15-31] 18 (10/28 0900) BP: (90-205)/(49-114) 159/75 (10/28 0900) SpO2:  [90 %-100 %] 96 % (10/28 0900) Weight:  [97.1 kg (214 lb)-98.2 kg (216 lb 7.9 oz)] 98.2 kg (216 lb 7.9 oz) (10/28 0533)  CBC:   Recent Labs Lab 04/02/16 2301  WBC 11.6*  HGB 13.3  HCT 40.0  MCV 90.1  PLT 99991111    Basic Metabolic Panel:   Recent Labs Lab 04/02/16 2301  NA 138   137  K 3.0*  3.1*  CL 108  107  CO2 23  25  GLUCOSE 102*  103*  BUN 18  19  CREATININE 0.76  0.74  CALCIUM 8.5*  8.5*    Lipid Panel:     Component Value Date/Time   CHOL 133 03/19/2014 0449   TRIG 131 03/19/2014 0449   HDL 30 (L) 03/19/2014 0449   CHOLHDL 4.4 03/19/2014 0449   VLDL 26 03/19/2014 0449   LDLCALC 77 03/19/2014 0449   HgbA1c:  Lab Results  Component Value Date   HGBA1C 6.1 (H) 12/19/2014   Urine Drug Screen:     Component Value Date/Time   LABOPIA POSITIVE (A) 04/02/2016 2334   COCAINSCRNUR NONE DETECTED 04/02/2016 2334   LABBENZ POSITIVE (A) 04/02/2016 2334   AMPHETMU NONE DETECTED 04/02/2016 2334   THCU NONE DETECTED 04/02/2016 2334   LABBARB NONE DETECTED 04/02/2016 2334      IMAGING  Ct Head Wo Contrast 04/03/2016 No acute intracranial process identified. No evidence for hemorrhage status post tPA administration.     Ct Head Wo Contrast 04/03/2016 No CT evidence for acute intracranial abnormality. Few subcortical white matter hypodense lesions, unchanged.     MRI / MRA Head - pending     PHYSICAL EXAM Constitutional: Appears well-developed and well-nourished.  Psych: Affect appropriate to situation Eyes: No scleral injection HENT: No OP obstrucion Head: Normocephalic.  Cardiovascular: Normal rate and regular rhythm.  Respiratory: Effort normal and breath sounds  normal to anterior ascultation GI: Soft.  No distension. There is no tenderness.  Skin: WDI  Neuro: Mental Status: Patient is awake, alert, oriented to person, place, month, year, and situation. Patient is able to give a clear and coherent history. No signs of aphasia or neglect Cranial Nerves: Left lower facial weakness Motor: Tone is normal. Bulk is normal. She has 2/5 weakness of the left upper extremity, 2/5 in the left lower extremity Sensory: Sensation is decreased on the left Cerebellar: FNF and HKS are intact on the right side, unable to perform on  the left    ASSESSMENT/PLAN Ms. Katrina [name] is a 55 y.o. female with history of previous strokes, tobacco use, peripheral neuropathy, multiple sclerosis, lung nodules, hypertension, hyperlipidemia, migraine headaches, COPD, coronary artery disease, anxiety, asthma, and chronic back pain presenting with left-sided weakness and numbness. She received IV TPA 04/03/2016 at 00 45.  Stroke :  Probable right internal capsule infarct with left face, arm, and leg weakness and normal mentation.  MRI  pending  MRA  pending  Carotid Doppler pending  2D Echo pending  LDL - 77  HgbA1c pending  VTE prophylaxis - SCDs Diet NPO time specified  clopidogrel 75 mg daily prior to admission, now on No antithrombotic secondary to TPA  Patient counseled to be compliant with her antithrombotic medications  Ongoing aggressive stroke risk factor management  Therapy recommendations:  pending  Disposition: Pending  Hypertension  Stable  Permissive hypertension (OK if < 220/120) but gradually normalize in 5-7 days  Long-term BP goal normotensive  Hyperlipidemia  Home meds:  No lipid lowering medications prior to admission.  LDL 77, goal < 70  Consider low-dose Lipitor if MRI positive for stroke  Continue statin at discharge   Other Stroke Risk Factors  Cigarette smoker - advised to stop smoking  Obesity, Body mass index is 34.94 kg/m., recommend weight loss, diet and exercise as appropriate   Hx stroke/TIA  Coronary artery disease  Migraines  Other Active Problems  Transient chest pain - troponin enzymes pending  Hypokalemia - 3.1 -> supplement  Mild leukocytosis - 11.6  Hospital day # 0  Probable acute right IC ischemic infarct with left hemiparesis.  BP is well controlled off Cardene gtt.  Will resume antiplatelets after 24 hours.  If she was compliant with Plavix, change to Aggrenox may be warranted.    For migraines, she should be on preventative.  I will start  Propranolol ER 60 mg qhs for now and titrate as needed.  Triptans are contraindicated with stroke, so will try Cambia 50 mg prn as outpatient for abortive therapy.     Rogue Jury, MD  To contact Stroke Continuity provider, please refer to [email or web address] After hours, contact General Neurology

## 2016-04-03 NOTE — ED Notes (Signed)
Pt on bedpan- urine specimen collected and to the lab- Pt assisted in movement to get on and off the bedpan and with linen change

## 2016-04-03 NOTE — Progress Notes (Signed)
~  0550: Pt c/o sudden onset of severe 10/10 HA, described as pressure "My head is going to pop off". Adjusted lighting in room. MD paged, notified.  Dr. Leonel Ramsay up to room. Order for stat head CT. Instructed to keep pt NPO from diet until morning rounds.

## 2016-04-03 NOTE — ED Notes (Signed)
Pt BP lowered as recorded- TPA started per Neuro order as well as NS bolus-

## 2016-04-04 ENCOUNTER — Inpatient Hospital Stay (HOSPITAL_COMMUNITY): Payer: Medicare Other

## 2016-04-04 LAB — BASIC METABOLIC PANEL
Anion gap: 6 (ref 5–15)
BUN: 17 mg/dL (ref 6–20)
CALCIUM: 8.6 mg/dL — AB (ref 8.9–10.3)
CHLORIDE: 113 mmol/L — AB (ref 101–111)
CO2: 21 mmol/L — AB (ref 22–32)
CREATININE: 0.91 mg/dL (ref 0.44–1.00)
GFR calc non Af Amer: >60 mL/min (ref >60)
GLUCOSE: 108 mg/dL — AB (ref 65–99)
Potassium: 4.3 mmol/L (ref 3.5–5.1)
Sodium: 140 mmol/L (ref 135–145)

## 2016-04-04 LAB — CBC
HCT: 38.4 % (ref 36.0–46.0)
Hemoglobin: 12.2 g/dL (ref 12.0–15.0)
MCH: 29.1 pg (ref 26.0–34.0)
MCHC: 31.8 g/dL (ref 30.0–36.0)
MCV: 91.6 fL (ref 78.0–100.0)
PLATELETS: 211 10*3/uL (ref 150–400)
RBC: 4.19 MIL/uL (ref 3.87–5.11)
RDW: 15.2 % (ref 11.5–15.5)
WBC: 7.6 10*3/uL (ref 4.0–10.5)

## 2016-04-04 LAB — LIPID PANEL
CHOLESTEROL: 114 mg/dL (ref 0–200)
HDL: 26 mg/dL — ABNORMAL LOW (ref >40)
LDL Cholesterol: 57 mg/dL (ref 0–99)
Total CHOL/HDL Ratio: 4.4 RATIO
Triglycerides: 153 mg/dL — ABNORMAL HIGH (ref <150)
VLDL: 31 mg/dL (ref 0–40)

## 2016-04-04 MED ORDER — ZOLPIDEM TARTRATE 5 MG PO TABS
5.0000 mg | ORAL_TABLET | Freq: Once | ORAL | Status: AC
  Administered 2016-04-04: 5 mg via ORAL
  Filled 2016-04-04: qty 1

## 2016-04-04 MED ORDER — OXYCODONE-ACETAMINOPHEN 5-325 MG PO TABS
1.0000 | ORAL_TABLET | Freq: Three times a day (TID) | ORAL | Status: DC | PRN
  Administered 2016-04-04 – 2016-04-05 (×2): 1 via ORAL
  Filled 2016-04-04 (×2): qty 1

## 2016-04-04 MED ORDER — CLOPIDOGREL BISULFATE 75 MG PO TABS
75.0000 mg | ORAL_TABLET | Freq: Every day | ORAL | Status: DC
  Administered 2016-04-04 – 2016-04-05 (×2): 75 mg via ORAL
  Filled 2016-04-04 (×2): qty 1

## 2016-04-04 MED ORDER — FAMOTIDINE 20 MG PO TABS
20.0000 mg | ORAL_TABLET | Freq: Two times a day (BID) | ORAL | Status: DC
  Administered 2016-04-04 – 2016-04-05 (×3): 20 mg via ORAL
  Filled 2016-04-04 (×3): qty 1

## 2016-04-04 MED ORDER — OXYCODONE HCL 5 MG PO TABS
2.5000 mg | ORAL_TABLET | Freq: Three times a day (TID) | ORAL | Status: DC | PRN
Start: 2016-04-04 — End: 2016-04-05
  Administered 2016-04-04: 2.5 mg via ORAL
  Filled 2016-04-04: qty 1

## 2016-04-04 NOTE — Evaluation (Signed)
Physical Therapy Evaluation Patient Details Name: Katrina [name] MRN: [id number] DOB: 1960/12/03 Today's Date: 04/04/2016   History of Present Illness  Patient is a 55 yo female admitted 04/02/16 from [facility] with Lt-sided weakness/numbness.  Patient received tPA on 04/03/16 @ 0045. No CT evidence for acute intracranial abnormality.    PMH:  Multiple admits with Lt sided weakness, Multiple Sclerosis, CAD, COPD, back pain, anxiety, HTN, HLD, lung nodules, MS, peripheral neuropathy, RLS  Clinical Impression  Patient presents with problems listed below.  Will benefit from acute PT to maximize functional mobility prior to discharge.  Recommend AFO for LLE due to decreased DF.  Recommend Inpatient Rehab consult for comprehensive therapies with goal to reach Mod I level to return home safely.    Follow Up Recommendations CIR;Supervision for mobility/OOB    Equipment Recommendations  AFO for LLE for foot drop;  Wheelchair (measurements PT);Wheelchair cushion (measurements PT) (Patient asking for shower seat - OT to address)    Recommendations for Other Services Rehab consult     Precautions / Restrictions Precautions Precautions: Fall Precaution Comments: Multiple falls.  Three falls recently down stairs. Restrictions Weight Bearing Restrictions: No      Mobility  Bed Mobility               General bed mobility comments: Patient sitting in chair.  Transfers Overall transfer level: Needs assistance Equipment used: 2 person hand held assist Transfers: Sit to/from Stand Sit to Stand: Min assist;+2 safety/equipment         General transfer comment: Assist for balance during transfer.  Patient initially maintains weight on RLE.  Worked on weight shift to LLE - knee in full extension.  Ambulation/Gait Ambulation/Gait assistance: Mod assist;+2 physical assistance Ambulation Distance (Feet): 3 Feet Assistive device: 2 person hand held assist Gait Pattern/deviations:  Step-through pattern;Decreased stance time - left;Decreased step length - right;Decreased step length - left;Decreased stride length;Decreased dorsiflexion - left;Decreased weight shift to left;Steppage   Gait velocity interpretation: Below normal speed for age/gender General Gait Details: Patient keeps Lt knee in full extension and decreased ankle control during stance phase with quick step of RLE.  Patient uses hip flexion and trunk rotation to advance LLE, with leg slapping floor.  No DF during swing phase.  Decreased balance during gait.  Stairs            Wheelchair Mobility    Modified Rankin (Stroke Patients Only)       Balance Overall balance assessment: Needs assistance;History of Falls         Standing balance support: Single extremity supported Standing balance-Leahy Scale: Poor                               Pertinent Vitals/Pain Pain Assessment: Faces Faces Pain Scale: Hurts little more Pain Location: Lt shoulder and Lt foot Pain Descriptors / Indicators: Aching Pain Intervention(s): Monitored during session;Repositioned    Home Living Family/patient expects to be discharged to:: Private residence Living Arrangements: Children Available Help at Discharge: Family;Available PRN/intermittently Type of Home: House (Patient lives in basement apartment of daughter's home) Home Access: Stairs to enter Entrance Stairs-Rails: Psychiatric nurse of Steps: 8 Home Layout: Two level (Up to first floor for bathroom and dinner) Home Equipment: Walker - 2 wheels;Walker - 4 wheels;Cane - single point;Bedside commode      Prior Function Level of Independence: Independent with assistive device(s);Needs assistance   Gait / Transfers Assistance Needed:  Patient has refused to use AD.  Holds onto walls/furniture to walk.  ADL's / Homemaking Assistance Needed: Daughter cooks meals and does housekeeping.          Hand Dominance   Dominant  Hand: Right    Extremity/Trunk Assessment   Upper Extremity Assessment: Defer to OT evaluation           Lower Extremity Assessment: LLE deficits/detail   LLE Deficits / Details: Decreased strength with 3/5 hip movement; 2/5 knee movement; 0/5 DF/PF;  Decreased sensation from knee distally     Communication   Communication: No difficulties  Cognition Arousal/Alertness: Awake/alert Behavior During Therapy: Restless;Impulsive Overall Cognitive Status: Within Functional Limits for tasks assessed (Decreased safety awareness baseline)                      General Comments      Exercises     Assessment/Plan    PT Assessment Patient needs continued PT services  PT Problem List Decreased strength;Decreased activity tolerance;Decreased balance;Decreased mobility;Decreased coordination;Decreased knowledge of use of DME;Decreased safety awareness;Impaired sensation;Impaired tone;Pain          PT Treatment Interventions DME instruction;Gait training;Stair training;Functional mobility training;Therapeutic activities;Therapeutic exercise;Balance training;Neuromuscular re-education;Cognitive remediation;Patient/family education    PT Goals (Current goals can be found in the Care Plan section)  Acute Rehab PT Goals Patient Stated Goal: To go home PT Goal Formulation: With patient Time For Goal Achievement: 04/11/16 Potential to Achieve Goals: Good    Frequency Min 4X/week   Barriers to discharge Decreased caregiver support Daughter works and patient home alone during day.    Co-evaluation               End of Session Equipment Utilized During Treatment: Gait belt Activity Tolerance: Patient limited by fatigue Patient left: in chair;with call bell/phone within reach Nurse Communication: Mobility status         Time: [id number] PT Time Calculation (min) (ACUTE ONLY): 19 min   Charges:   PT Evaluation $PT Eval High Complexity: 1 Procedure     PT G  CodesDespina Pole 04/05/16, 4:39 PM Carita Pian. Sanjuana Kava, [place] Pager [phone]

## 2016-04-04 NOTE — Progress Notes (Signed)
STROKE TEAM PROGRESS NOTE   HISTORY OF PRESENT ILLNESS (per record) Katrina [name] is a 55 y.o. female who was in her normal state of health until around 10:30 PM. She then developed chest pain which was transient. Was not, however, was left-sided weakness and numbness over and above what she typically has from her previous stroke. She reports a stroke in April, however on review of the records there is question of whether this actually represents multiple sclerosis.  She has had multiple admissions for left-sided weakness without clear imaging findings to support the diagnosis of stroke.  She presented to the [facility] emergency department where she was evaluated by a tele-neurology service and the decision was made to give her IV TPA. She has therefore been transferred to [facility] for further evaluation.  LKW: 10:30 PM tpa given?: Yes   SUBJECTIVE (INTERVAL HISTORY) Migraine has improved.  BP is much better.  Repeat CT Brain last night at 24 hours showed no hemorrhage from IV tPA.  Nurse never called neurologist for Plavix re-start.  It was re-started this am.  Still refuses MRI/MRA Brain.   OBJECTIVE Temp:  [97.7 F (36.5 C)-98.8 F (37.1 C)] 98.5 F (36.9 C) (10/29 0300) Pulse Rate:  [70-86] 72 (10/29 0800) Cardiac Rhythm: Normal sinus rhythm (10/28 2000) Resp:  [15-26] 16 (10/29 0800) BP: (130-201)/(65-110) 149/78 (10/29 0800) SpO2:  [91 %-99 %] 95 % (10/29 0800)  CBC:   Recent Labs Lab 04/03/16 0857 04/04/16 0517  WBC 8.9 7.6  HGB 12.8 12.2  HCT 40.1 38.4  MCV 91.8 91.6  PLT 236 [id number]    Basic Metabolic Panel:   Recent Labs Lab 04/03/16 0857 04/04/16 0517  NA 142 140  K 4.1 4.3  CL 113* 113*  CO2 22 21*  GLUCOSE 101* 108*  BUN 14 17  CREATININE 0.71 0.91  CALCIUM 8.8* 8.6*    Lipid Panel:     Component Value Date/Time   CHOL 114 04/04/2016 0517   TRIG 153 (H) 04/04/2016 0517   HDL 26 (L) 04/04/2016 0517   CHOLHDL 4.4 04/04/2016 0517   VLDL 31 04/04/2016 0517   LDLCALC 57 04/04/2016 0517   HgbA1c:  Lab Results  Component Value Date   HGBA1C 6.1 (H) 12/19/2014   Urine Drug Screen:     Component Value Date/Time   LABOPIA POSITIVE (A) 04/02/2016 2334   COCAINSCRNUR NONE DETECTED 04/02/2016 2334   LABBENZ POSITIVE (A) 04/02/2016 2334   AMPHETMU NONE DETECTED 04/02/2016 2334   THCU NONE DETECTED 04/02/2016 2334   LABBARB NONE DETECTED 04/02/2016 2334      IMAGING  Ct Head Wo Contrast 04/03/2016 No acute intracranial process identified. No evidence for hemorrhage status post tPA administration.     Ct Head Wo Contrast 04/03/2016 No CT evidence for acute intracranial abnormality. Few subcortical white matter hypodense lesions, unchanged.     MRI / MRA Head - Pending      PHYSICAL EXAM Constitutional: Appears well-developed and well-nourished.  Psych: Affect appropriate to situation Eyes: No scleral injection HENT: No OP obstrucion Head: Normocephalic.  Cardiovascular: Normal rate and regular rhythm.  Respiratory: Effort normal and breath sounds normal to anterior ascultation GI: Soft.  No distension. There is no tenderness.  Skin: WDI  Neuro: Mental Status: Patient is awake, alert, oriented to person, place, month, year, and situation. Patient is able to give a clear and coherent history. No signs of aphasia or neglect Cranial Nerves: Left lower facial weakness ?left lateral rectus palsy-?old  vs new Motor: Tone is normal. Bulk is normal. She has 2/5 weakness of the left upper extremity, 2/5 in the left lower extremity Sensory: Sensation is decreased on the left Cerebellar: FNF and HKS are intact on the right side, unable to perform on the left    ASSESSMENT/PLAN Katrina [name] is a 55 y.o. female with history of previous strokes, tobacco use, peripheral neuropathy, multiple sclerosis, lung nodules, hypertension, hyperlipidemia, migraine headaches, COPD, coronary artery disease,  anxiety, asthma, and chronic back pain presenting with left-sided weakness and numbness. She received IV TPA 04/03/2016 at 00 45.  Stroke :  Probable right internal capsule infarct with left face, arm, and leg weakness and normal mentation.  MRI  pending  MRA  pending  Carotid Doppler pending  2D Echo pending  LDL - 77  HgbA1c pending  VTE prophylaxis - SCDs Diet Heart Room service appropriate? Yes; Fluid consistency: Thin  clopidogrel 75 mg daily prior to admission, now on No antithrombotic secondary to TPA  Patient counseled to be compliant with her antithrombotic medications  Ongoing aggressive stroke risk factor management  Therapy recommendations:  pending  Disposition: Pending  Hypertension  Stable  Permissive hypertension (OK if < 220/120) but gradually normalize in 5-7 days  Long-term BP goal normotensive  Hyperlipidemia  Home meds:  No lipid lowering medications prior to admission.  LDL 77, goal < 70  Consider low-dose Lipitor if MRI positive for stroke  Continue statin at discharge   Other Stroke Risk Factors  Cigarette smoker - advised to stop smoking  Obesity, Body mass index is 34.94 kg/m., recommend weight loss, diet and exercise as appropriate   Hx stroke/TIA  Coronary artery disease  Migraines  Other Active Problems  Transient chest pain - troponin enzymes - < 0.03  Hypokalemia - 3.1 -> supplemented -> 4.3 today   Mild leukocytosis - 11.6 -> 7.6  Hospital day # 1  Probable acute right IC ischemic infarct with left hemiparesis.  BP is well controlled off Cardene gtt, but I did start beta blocker for migraine prevention.  Her headache has improved.  Plavix was re-started today.  She says she wants to leave the hospital and smoke.  I counselled her extensively and she will be transferred to step down unit.     Rogue Jury, MD  To contact Stroke Continuity provider, please refer to [email or web address] After hours, contact General  Neurology

## 2016-04-04 NOTE — Progress Notes (Signed)
Pt upset stating "she is ready to go home, she wants a cigarette." MD present in the room and myself both educated on  The importance of stopping smoking. Pt stated "she has been smoking since she was 55 years old and she has no intentions of quitting. Stated that smoking is her only enjoyment. Pt also stated she isn't given up her salt. I continued to educate on the importance to help prevent further strokes.pt is not open to the education.

## 2016-04-04 NOTE — Progress Notes (Signed)
Rehab Admissions Coordinator Note:  Patient was screened by Cleatrice Burke for appropriateness for an Inpatient Acute Rehab Consult per PT recommendation.   At this time, we are recommending an inpt rehab consult. Please place ordeer.  Cleatrice Burke 04/04/2016, 7:18 PM  I can be reached at [phone].

## 2016-04-05 ENCOUNTER — Inpatient Hospital Stay (HOSPITAL_COMMUNITY): Payer: Medicare Other

## 2016-04-05 DIAGNOSIS — I679 Cerebrovascular disease, unspecified: Secondary | ICD-10-CM

## 2016-04-05 DIAGNOSIS — F411 Generalized anxiety disorder: Secondary | ICD-10-CM

## 2016-04-05 DIAGNOSIS — Z87898 Personal history of other specified conditions: Secondary | ICD-10-CM

## 2016-04-05 DIAGNOSIS — I6300 Cerebral infarction due to thrombosis of unspecified precerebral artery: Secondary | ICD-10-CM

## 2016-04-05 DIAGNOSIS — G441 Vascular headache, not elsewhere classified: Secondary | ICD-10-CM

## 2016-04-05 DIAGNOSIS — I2510 Atherosclerotic heart disease of native coronary artery without angina pectoris: Secondary | ICD-10-CM

## 2016-04-05 DIAGNOSIS — G629 Polyneuropathy, unspecified: Secondary | ICD-10-CM

## 2016-04-05 DIAGNOSIS — M545 Low back pain, unspecified: Secondary | ICD-10-CM

## 2016-04-05 DIAGNOSIS — F4310 Post-traumatic stress disorder, unspecified: Secondary | ICD-10-CM

## 2016-04-05 DIAGNOSIS — G8929 Other chronic pain: Secondary | ICD-10-CM

## 2016-04-05 DIAGNOSIS — I6789 Other cerebrovascular disease: Secondary | ICD-10-CM

## 2016-04-05 DIAGNOSIS — R531 Weakness: Secondary | ICD-10-CM

## 2016-04-05 DIAGNOSIS — I6930 Unspecified sequelae of cerebral infarction: Secondary | ICD-10-CM

## 2016-04-05 DIAGNOSIS — G6289 Other specified polyneuropathies: Secondary | ICD-10-CM

## 2016-04-05 DIAGNOSIS — G43909 Migraine, unspecified, not intractable, without status migrainosus: Secondary | ICD-10-CM

## 2016-04-05 DIAGNOSIS — I10 Essential (primary) hypertension: Secondary | ICD-10-CM

## 2016-04-05 LAB — ECHOCARDIOGRAM COMPLETE
CHL CUP MV DEC (S): 217
CHL CUP RV SYS PRESS: 25 mmHg
EWDT: 217 ms
FS: 44 % (ref 28–44)
Height: 66 in
IV/PV OW: 0.67
LA ID, A-P, ES: 34 mm
LA diam end sys: 34 mm
LA diam index: 1.64 cm/m2
LA vol A4C: 54.3 ml
LA vol index: 25.8 mL/m2
LA vol: 53.4 mL
LDCA: 2.01 cm2
LV e' LATERAL: 7.18 cm/s
LVOT diameter: 16 mm
MVPKEVEL: 0.9 m/s
PW: 12 mm — AB (ref 0.6–1.1)
RV LATERAL S' VELOCITY: 13.6 cm/s
Reg peak vel: 235 cm/s
TAPSE: 22.8 mm
TDI e' lateral: 7.18
TDI e' medial: 7.72
TRMAXVEL: 235 cm/s
WEIGHTICAEL: 3463.87 [oz_av]

## 2016-04-05 LAB — HEMOGLOBIN A1C
HEMOGLOBIN A1C: 5.8 % — AB (ref 4.8–5.6)
MEAN PLASMA GLUCOSE: 120 mg/dL

## 2016-04-05 MED ORDER — GABAPENTIN 600 MG PO TABS: 600.0000 mg | ORAL_TABLET | Freq: Three times a day (TID) | ORAL | 2 refills | Status: DC

## 2016-04-05 MED ORDER — NICOTINE 14 MG/24HR TD PT24: 14.0000 mg | MEDICATED_PATCH | TRANSDERMAL | 0 refills | Status: DC

## 2016-04-05 MED ORDER — PROPRANOLOL HCL ER 60 MG PO CP24: 60.0000 mg | ORAL_CAPSULE | Freq: Every day | ORAL | 2 refills | Status: DC

## 2016-04-05 NOTE — Consult Note (Signed)
Physical Medicine and Rehabilitation Consult  Reason for Consult: Left sided weakness and numbness Referring Physician: Dr. Leonie Man   HPI: Katrina [name] is a 55 y.o. female with history of Non-obstructive CAD, anxiety disorder, chronic back pain with peripheral neuropathy, migraines, HTN, PTSD/bipolar disorder (does not like the meds), mediation non-compliance, CVA last year with left sided weakness and left field cut as well as left sided weakness with question of MS diagnosis and no clear diagnosis of stroke per imaging. medication non-compliance,  She was readmitted on 04/02/16 with transient CP and worsening of left sided weakness with increase in numbness therefore decision was made to treat patient with IV tPA. She was transferred to [facility] for evaluation and follow up CT head 10/28 and 10/29 without evidence of acute intracranial disease or evolving ischemia. Has had issues with HA and photophobia and was started on propranolol. Has refused MRA/MRI brain--want general anesthesia due to claustrophobia. Therapy evaluation done and CIR recommended for follow up therapy.   Has an in-law apartment at daughter's home. Was sedentary PTA--does not like to use AD but reports that she does manage her place and drives. Daughter does grocery shopping and provides meals.      Review of Systems  Constitutional: Negative for diaphoresis.  Eyes: Positive for blurred vision (since admission).  Respiratory: Positive for shortness of breath (chronic).   Cardiovascular: Positive for chest pain (on and off for years--negative work up early this year).  Gastrointestinal: Negative for heartburn, nausea and vomiting.  Genitourinary: Positive for dysuria. Negative for urgency.  Musculoskeletal: Positive for back pain and myalgias.  Neurological: Positive for sensory change, focal weakness (LLE weakness with left foot drop--has been trying to get a  brace), weakness and headaches.  Psychiatric/Behavioral: The  patient is nervous/anxious and has insomnia.   All other systems reviewed and are negative.   Past Medical History:  Diagnosis Date  . Anginal pain ([place])   . Anxiety    takes Valium daily as needed  . Asthma    has inhalers but doesn't use  . CAD (coronary artery disease)    Moderate nonobstructive 2012-2013, [place]  . Chronic back pain    budlging disc   . COPD (chronic obstructive pulmonary disease) ([place])   . Diverticulitis   . Family history of adverse reaction to anesthesia    daughter gets extremely sick   . GERD (gastroesophageal reflux disease)    takes Dexilant daily  . History of blood transfusion 1975/1976   no abnormal reaction noted  . History of bronchitis 06/2015  . History of colitis   . History of colon polyps    benign  . History of gastric ulcer   . History of migraine    last one a few days ago  . History of MRSA infection   . Hyperlipidemia    was on meds but has been off over a yr  . Hypertension    takes Amlodipine and Maxzide daily  . Insomnia    takes Ambien nightly  . Lung nodules   . Microscopic hematuria   . MS (multiple sclerosis) ([place])    questionable per pt  . Peripheral neuropathy (HCC)    weakness,numbness,and tingling. Takes Gabapentin daily  . Pneumonia 06/2015  . Restless leg syndrome    takes Requip daily  . Stroke [facility])    takes Plavix daily.Left sided weakness    Past Surgical History:  Procedure Laterality Date  . ABDOMINAL HYSTERECTOMY    .  ANKLE SURGERY Bilateral   . APPENDECTOMY    . BACK SURGERY     fusion  . CARDIAC CATHETERIZATION N/A 01/31/2015   Procedure: Left Heart Cath and Coronary Angiography;  Surgeon: Burnell Blanks, MD;  Location: [place] [facility];  Service: Cardiovascular;  Laterality: N/A;  . CHOLECYSTECTOMY    . COLONOSCOPY    . ESOPHAGOGASTRODUODENOSCOPY    . NOSE SURGERY    . RADIOLOGY WITH ANESTHESIA N/A 01/15/2016   Procedure: MRI LUMBAR SPINE WITHOUT;  Surgeon: Medication  Radiologist, MD;  Location: [place];  Service: Radiology;  Laterality: N/A;  . TONSILLECTOMY    . 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 Coronary artery disease Father 89  . Emphysema Father   . Depression Mother   . Bipolar disorder Brother   . Drug abuse Brother   . Bipolar disorder Daughter     Social History:  Lives with family.  Disabled since stroke 2010 due to back problems.  She reports that she has been smoking Cigarettes--1-2 PPD.  She started smoking about 45 years ago. She has a 88.00 pack-year smoking history. She uses smokeless tobacco. She reports that she does not drink alcohol or use drugs.  Allergies  Allergen Reactions  . Penicillins Anaphylaxis    Has patient had a PCN reaction causing immediate rash, facial/tongue/throat swelling, SOB or lightheadedness with hypotension: Yes Has patient had a PCN reaction causing severe rash involving mucus membranes or skin necrosis: Yes Has patient had a PCN reaction that required hospitalization Yes Has patient had a PCN reaction occurring within the last 10 years: No If all of the above answers are "NO", then may proceed with Cephalosporin use.   . Zithromax [Azithromycin] Anaphylaxis  . Aspirin Other (See Comments)    Due to stomach ulcers.   . Pineapple Rash  . Strawberry Extract Rash  . Aspartame And Phenylalanine Palpitations  . Mushroom Extract Complex Rash  . Nicardipine Nausea And Vomiting and Other (See Comments)    shaking  . Poultry Meal Other (See Comments)    Allergic to [place]    Medications Prior to Admission  Medication Sig Dispense Refill  . albuterol (PROVENTIL HFA;VENTOLIN HFA) 108 (90 Base) MCG/ACT inhaler Inhale 1 puff into the lungs every 4 (four) hours as needed for wheezing.    Marland Kitchen amLODipine (NORVASC) 10 MG tablet Take 1 tablet (10 mg total) by mouth daily. 30 tablet 6  . baclofen (LIORESAL) 10 MG tablet Take 10  mg by mouth every 8 (eight) hours as needed for pain.    Marland Kitchen clopidogrel (PLAVIX) 75 MG tablet Take 75 mg by mouth daily.    Marland Kitchen dexlansoprazole (DEXILANT) 60 MG capsule Take 1 capsule (60 mg total) by mouth every evening. 30 capsule 1  . gabapentin (NEURONTIN) 600 MG tablet Take 600 mg by mouth 4 (four) times daily.  0  . oxyCODONE-acetaminophen (PERCOCET) 7.5-325 MG tablet Take 1 tablet by mouth every 8 (eight) hours as needed for pain.    Marland Kitchen rOPINIRole (REQUIP) 4 MG tablet Take 4 mg by mouth at bedtime.    Marland Kitchen zolpidem (AMBIEN) 10 MG tablet Take 1 tablet (10 mg total) by mouth at bedtime. 30 tablet 0  . zolpidem (AMBIEN) 5 MG tablet Take 5 mg by mouth every evening.      Home: Home Living Family/patient expects to be discharged to::  Private residence Living Arrangements: Children Available Help at Discharge: Family, Available PRN/intermittently Type of Home: House (Patient lives in basement apartment of daughter's home) Home Access: Stairs to enter Technical brewer of Steps: 8 Entrance Stairs-Rails: Right, Left Home Layout: Two level (Up to first floor for bathroom and dinner) Alternate Level Stairs-Number of Steps: 14 Alternate Level Stairs-Rails: Right Home Equipment: Gilford Rile - 2 wheels, Walker - 4 wheels, Cane - single point, Bedside commode  Functional History: Prior Function Level of Independence: Independent with assistive device(s), Needs assistance Gait / Transfers Assistance Needed: Patient has refused to use AD.  Holds onto walls/furniture to walk. ADL's / Homemaking Assistance Needed: Daughter cooks meals and does housekeeping.   Functional Status:  Mobility: Bed Mobility General bed mobility comments: Patient sitting in chair. Transfers Overall transfer level: Needs assistance Equipment used: 2 person hand held assist Transfers: Sit to/from Stand Sit to Stand: Min assist, +2 safety/equipment General transfer comment: Assist for balance during transfer.  Patient  initially maintains weight on RLE.  Worked on weight shift to LLE - knee in full extension. Ambulation/Gait Ambulation/Gait assistance: Mod assist, +2 physical assistance Ambulation Distance (Feet): 3 Feet Assistive device: 2 person hand held assist Gait Pattern/deviations: Step-through pattern, Decreased stance time - left, Decreased step length - right, Decreased step length - left, Decreased stride length, Decreased dorsiflexion - left, Decreased weight shift to left, Steppage General Gait Details: Patient keeps Lt knee in full extension and decreased ankle control during stance phase with quick step of RLE.  Patient uses hip flexion and trunk rotation to advance LLE, with leg slapping floor.  No DF during swing phase.  Decreased balance during gait. Gait velocity interpretation: Below normal speed for age/gender    ADL:    Cognition: Cognition Overall Cognitive Status: Within Functional Limits for tasks assessed (Decreased safety awareness baseline) Orientation Level: Oriented X4 Cognition Arousal/Alertness: Awake/alert Behavior During Therapy: Restless, Impulsive Overall Cognitive Status: Within Functional Limits for tasks assessed (Decreased safety awareness baseline)   Blood pressure (!) 191/101, pulse 77, temperature 98 F (36.7 C), temperature source Oral, resp. rate 18, height 5\' 6"  (1.676 m), weight 98.2 kg (216 lb 7.9 oz), SpO2 97 %. Physical Exam  Vitals reviewed. Constitutional: She is oriented to person, place, and time. She appears well-developed and well-nourished.  HENT:  Head: Normocephalic and atraumatic.  Eyes: Conjunctivae and EOM are normal.  Neck: Normal range of motion. Neck supple.  Cardiovascular: Normal rate and regular rhythm.   No JVD  Respiratory: Effort normal and breath sounds normal.  GI: Soft. Bowel sounds are normal.  Musculoskeletal: She exhibits no edema or tenderness.  Neurological: She is alert and oriented to person, place, and time.    Motor: RUE?RLE: 5/5 proximal to distal LUE: shoulder abduction 2/5, elbow flexion/extension 2+/5, hand grip 3/5 LLE: 1/5 proximal to distal Sensation diminished to light touch LUE/LLE  Skin: Skin is warm and dry.  Psychiatric: Her speech is normal. Her affect is blunt. She is agitated.    No results found for this or any previous visit (from the past 24 hour(s)). Ct Head Wo Contrast  Result Date: 04/04/2016 CLINICAL DATA:  Initial evaluation for stroke.  Left-sided weakness. EXAM: CT HEAD WITHOUT CONTRAST TECHNIQUE: Contiguous axial images were obtained from the base of the skull through the vertex without intravenous contrast. COMPARISON:  Prior CTs from 04/02/2016 as well as 04/03/2016. FINDINGS: Brain: Cerebral volume stable. Mild cerebral white matter disease noted, stable. No acute intracranial hemorrhage. Gray-white matter differentiation maintained.  No findings to suggest evolving ischemia. No mass lesion, midline shift or mass effect. No hydrocephalus. No extra-axial fluid collection. Vascular: No hyperdense vessel. Skull: Stable unremarkable scalp soft tissues and calvarium. Sinuses/Orbits: Globes and orbital soft tissues within normal limits. Paranasal sinuses and mastoid air cells are clear. IMPRESSION: 1. Stable appearance of the brain. No acute intracranial process identified. No evidence of hemorrhage status post tPA administration. No evidence for evolving ischemia. 2. 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 Electronically Signed   By: Jeannine Boga M.D.   On: 04/04/2016 01:42    Assessment/Plan: Diagnosis: Presumed right int capsule CVA Labs and images independently reviewed.  Records reviewed and summated above. Stroke: Continue secondary stroke prophylaxis and Risk Factor Modification listed below:   Antiplatelet therapy:   Blood Pressure Management:  Continue current medication with prn's with permisive HTN per primary team Statin Agent:   PreDiabetes  management:   Tobacco abuse:   Left sided hemiparesis: fit for orthosis to prevent contractures (resting hand splint for day, wrist cock up splint at night, PRAFO, etc) Motor recovery: Fluoxetine  1. Does the need for close, 24 hr/day medical supervision in concert with the patient's rehab needs make it unreasonable for this patient to be served in a less intensive setting? Yes  2. Co-Morbidities requiring supervision/potential complications: HA (cont meds), chest pain (further workup as necessary), Non-obstructive CAD (cont meds), anxiety disorder (meds as needed), chronic back pain with peripheral neuropathy (Biofeedback training with therapies to help reduce reliance on opiate pain medications, monitor pain control during therapies, and sedation at rest and titrate to maximum efficacy to ensure participation and gains in therapies), migraines (cont meds), HTN (monitor and provide prns in accordance with increased physical exertion and pain), PTSD/bipolar disorder (refusing meds), mediation non-compliance (cont to educate), CVA last year with left sided weakness, tobacco abuse (counsel), ?MS (neurology recs and follow up) 3. Due to bladder management, safety, disease management and patient education, does the patient require 24 hr/day rehab nursing? Yes 4. Does the patient require coordinated care of a physician, rehab nurse, PT (1-2 hrs/day, 5 days/week) and OT (1-2 hrs/day, 5 days/week) to address physical and functional deficits in the context of the above medical diagnosis(es)? Yes Addressing deficits in the following areas: balance, endurance, locomotion, strength, transferring, bathing, dressing, toileting, cognition and psychosocial support 5. Can the patient actively participate in an intensive therapy program of at least 3 hrs of therapy per day at least 5 days per week? Yes 6. The potential for patient to make measurable gains while on inpatient rehab is excellent 7. Anticipated functional  outcomes upon discharge from inpatient rehab are supervision and min assist  with PT, supervision and min assist with OT, n/a with SLP. 8. Estimated rehab length of stay to reach the above functional goals is: 16-19 days. 9. Does the patient have adequate social supports and living environment to accommodate these discharge functional goals? Potentially 10. Anticipated D/C setting: Home 11. Anticipated post D/C treatments: HH therapy and Home excercise program 12. Overall Rehab/Functional Prognosis: good and fair  RECOMMENDATIONS: This patient's condition is appropriate for continued rehabilitative care in the following setting: Would recommend CIR, however, pt currently adamently refusing.  IF pt changes her mind, she would need to show compliance with medication and therapy regimen. If pt continues to refuse, would recommend follow up in PM&R clinic. Patient has agreed to participate in recommended program. No Note that insurance prior authorization may be required for reimbursement for recommended care.  Comment: Rehab Admissions Coordinator to follow up.  Delice Lesch, MD, Mellody Drown 04/05/2016

## 2016-04-05 NOTE — Discharge Summary (Addendum)
Physician Discharge Summary  Patient ID: Katrina [name] MRN: [id number] DOB/AGE: 10/12/1960 73 y.o.  Admit date: 04/02/2016 Discharge date: 04/05/2016  Admission Diagnoses:Code stroke  Discharge Diagnoses: Stroke like symptoms but likely nonorganic pattern of weakness and numbness. Treated with IV TPA without complications with clinical recovery patient unwilling to stay and complete stroke evaluation and requesting Active Problems:   CVA (cerebral vascular accident) ([place])   Stroke (cerebrum) ([place])   Cerebrovascular accident (CVA) ([place])   Vascular headache   History of chest pain   Coronary artery disease involving native coronary artery of native heart without angina pectoris   Generalized anxiety disorder   Chronic bilateral low back pain without sciatica   Peripheral neuropathy (HCC)   Migraine without status migrainosus, not intractable   Benign essential HTN   PTSD (post-traumatic stress disorder)   History of cerebrovascular accident (CVA) with residual deficit   Discharged Condition: fair  Hospital Course: Katrina [name] is a 55 y.o. female who was in her normal state of health until around 10:30 PM. She then developed chest pain which was transient. Was not, however, was left-sided weakness and numbness over and above what she typically has from her previous stroke. She reports a stroke in April, however on review of the records there is question of whether this actually represents multiple sclerosis.She has had multiple admissions for left-sided weakness without clear imaging findings to support the diagnosis of stroke.She presented to the [facility] emergency department where she was evaluated by a tele-neurology service and the decision was made to give her IV TPA. She has therefore been transferred to [facility] for further evaluation. LKW: 10:30 PM tpa given?: Yes.Patient was transferred for further evaluation to [facility]. She was kept in intensive care  unit where blood pressure was kept under tight control with close neurological monitoring. Patient showed improvement in her left-sided weakness. Patient refused MRI due to claustrophobia. CT scan of the brain on admission as well as repeated 24 hours later did not show any acute abnormality. Carotid ultrasound showed no significant extra cranial stenosis. Transthoracic echo showed normal ejection fraction. Patient refused to stay and complete further workup and wanted to be discharged home. On exam patient did have some nonorganic features with subjective gate giveaway of her left-sided weakness as well as nonorganic pattern of sensory loss with splitting of the midline to touch pinprick as well as the forehead to vibration sensation. LDL cholesterol 57 mg percent. A1c is 5.8. Interestingly patient had somewhat similar presentation in July 2016 and she was taken from [facility] to [facility] with do not have details of those echo but apparently MRI did not show an acute stroke.  Consults: rehabilitation medicine  Significant Diagnostic Studies:  Treatments:IV TPA at [facility]  Discharge Exam: Blood pressure (!) 167/97, pulse 73, temperature 98 F (36.7 C), temperature source Oral, resp. rate 18, height 5\' 6"  (1.676 m), weight 216 lb 7.9 oz (98.2 kg), SpO2 97 %. Constitutional: Appears well-developed and well-nourished.  Psych: Affect appropriate to situation Eyes: No scleral injection HENT: No OP obstrucion Head: Normocephalic.  Cardiovascular: Normal rate and regular rhythm.  Respiratory: Effort normal and breath sounds normal to anterior ascultation GI: Soft. No distension. There is no tenderness.  Skin: WDI  Neuro: Mental Status: Patient is awake, alert, oriented to person, place, month, year, and situation. Patient is able to give a clear and coherent history. No signs of aphasia or neglect Cranial Nerves: Mild left lower  facial weakness    Motor: Tone is normal. Bulk is normal. She has  Subjective3/5 weakness of the left upper extremity, 3/5 in the left lower extremity wound with extremely variable. Effort and she does better when distracted. Sensory: Sensation is decreased on the left inner hemibody distribution with splitting of the midline including vibration over the forehead. Cerebellar: FNF and HKS are intact on the right side, unable to perform on the left    Disposition: 01-Home or Self Care  Discharge Instructions    Ambulatory referral to Physical Medicine Rehab    Complete by:  As directed    1-2 months post discharge for CVA follow up   Increase activity slowly    Complete by:  As directed        Medication List    TAKE these medications   albuterol 108 (90 Base) MCG/ACT inhaler Commonly known as:  PROVENTIL HFA;VENTOLIN HFA Inhale 1 puff into the lungs every 4 (four) hours as needed for wheezing.   amLODipine 10 MG tablet Commonly known as:  NORVASC Take 1 tablet (10 mg total) by mouth daily.   baclofen 10 MG tablet Commonly known as:  LIORESAL Take 10 mg by mouth every 8 (eight) hours as needed for pain.   clopidogrel 75 MG tablet Commonly known as:  PLAVIX Take 75 mg by mouth daily.   dexlansoprazole 60 MG capsule Commonly known as:  DEXILANT Take 1 capsule (60 mg total) by mouth every evening.   gabapentin 600 MG tablet Commonly known as:  NEURONTIN Take 1 tablet (600 mg total) by mouth 3 (three) times daily. What changed:  when to take this   nicotine 14 mg/24hr patch Commonly known as:  NICODERM CQ - dosed in mg/24 hours Place 1 patch (14 mg total) onto the skin daily. Start taking on:  04/06/2016   oxyCODONE-acetaminophen 7.5-325 MG tablet Commonly known as:  PERCOCET Take 1 tablet by mouth every 8 (eight) hours as needed for pain.   propranolol ER 60 MG 24 hr capsule Commonly known as:  INDERAL LA Take 1 capsule (60 mg total) by mouth at bedtime.   rOPINIRole 4 MG  tablet Commonly known as:  REQUIP Take 4 mg by mouth at bedtime.   zolpidem 5 MG tablet Commonly known as:  AMBIEN Take 5 mg by mouth every evening. What changed:  Another medication with the same name was removed. Continue taking this medication, and follow the directions you see here.      Follow-up Information    [facility] Follow up in 2 week(s).   Why:  for hospital followup. stroke follow up not indicated Contact information: [place] [address] [phone]         Physical and occupational therapy recommended home health therapy but the patient left before we could arrange for this prior to her discharge and hence this was not ordered Time spent on discharge summary 35 minutes. Signed: SETHI,PRAMOD 04/05/2016, 4:50 PM

## 2016-04-05 NOTE — Progress Notes (Signed)
*  PRELIMINARY RESULTS* Vascular Ultrasound Carotid Duplex (Doppler) has been completed.  Findings suggest 1-39% internal carotid artery stenosis bilaterally. Vertebral arteries are patent with antegrade flow.  04/05/2016 12:17 PM Maudry Mayhew, BS, RVT, RDCS, RDMS

## 2016-04-05 NOTE — Progress Notes (Signed)
  Echocardiogram 2D Echocardiogram has been performed.  Katrina [name] 04/05/2016, 12:59 PM

## 2016-04-05 NOTE — Progress Notes (Signed)
D/c home orders, tele and iv d/c'd, understands d/c instr. But said she wasn't going to change her ambien dose nor take the inderal.

## 2016-04-05 NOTE — Progress Notes (Signed)
STROKE TEAM PROGRESS NOTE   SUBJECTIVE (INTERVAL HISTORY) Patient is sitting up comfortably in a chair. She states she still has some left-sided weakness and numbness.   OBJECTIVE Temp:  [97.7 F (36.5 C)-98.3 F (36.8 C)] 97.7 F (36.5 C) (10/30 0628) Pulse Rate:  [65-85] 72 (10/30 0628) Cardiac Rhythm: Normal sinus rhythm (10/29 2100) Resp:  [15-25] 18 (10/30 0628) BP: (163-189)/(74-96) 180/90 (10/30 0628) SpO2:  [90 %-100 %] 90 % (10/30 0628)  CBC:   Recent Labs Lab 04/03/16 0857 04/04/16 0517  WBC 8.9 7.6  HGB 12.8 12.2  HCT 40.1 38.4  MCV 91.8 91.6  PLT 236 [id number]    Basic Metabolic Panel:   Recent Labs Lab 04/03/16 0857 04/04/16 0517  NA 142 140  K 4.1 4.3  CL 113* 113*  CO2 22 21*  GLUCOSE 101* 108*  BUN 14 17  CREATININE 0.71 0.91  CALCIUM 8.8* 8.6*    Lipid Panel:     Component Value Date/Time   CHOL 114 04/04/2016 0517   TRIG 153 (H) 04/04/2016 0517   HDL 26 (L) 04/04/2016 0517   CHOLHDL 4.4 04/04/2016 0517   VLDL 31 04/04/2016 0517   LDLCALC 57 04/04/2016 0517   HgbA1c:  Lab Results  Component Value Date   HGBA1C 5.8 (H) 04/04/2016   Urine Drug Screen:     Component Value Date/Time   LABOPIA POSITIVE (A) 04/02/2016 2334   COCAINSCRNUR NONE DETECTED 04/02/2016 2334   LABBENZ POSITIVE (A) 04/02/2016 2334   AMPHETMU NONE DETECTED 04/02/2016 2334   THCU NONE DETECTED 04/02/2016 2334   LABBARB NONE DETECTED 04/02/2016 2334      IMAGING No results found.    PHYSICAL EXAM Constitutional: Appears well-developed and well-nourished.  Psych: Affect appropriate to situation Eyes: No scleral injection HENT: No OP obstrucion Head: Normocephalic.  Cardiovascular: Normal rate and regular rhythm.  Respiratory: Effort normal and breath sounds normal to anterior ascultation GI: Soft.  No distension. There is no tenderness.  Skin: WDI  Neuro: Mental Status: Patient is awake, alert, oriented to person, place, month, year, and  situation. Patient is able to give a clear and coherent history. No signs of aphasia or neglect Cranial Nerves: Mild left lower facial weakness   Motor: Tone is normal. Bulk is normal. She has  Subjective3/5 weakness of the left upper extremity, 3/5 in the left lower extremity wound with extremely variable. Effort and she does better when distracted. Sensory: Sensation is decreased on the left inner hemibody distribution with splitting of the midline including vibration over the forehead. Cerebellar: FNF and HKS are intact on the right side, unable to perform on the left   ASSESSMENT/PLAN Ms. Katrina [name] is a 55 y.o. female with history of tobacco use, peripheral neuropathy, multiple sclerosis, lung nodules, hypertension, hyperlipidemia, migraine headaches, COPD, coronary artery disease, anxiety, asthma, and chronic back pain presenting with left-sided weakness and numbness. She received IV TPA 04/03/2016 at 00 45.    Stroke like symptoms :   Post tPA CT at 24h stable, no acute abnormality patient refused MRI and repeat CT scan shows no acute infarct.  Post tPA CT at 48h stable, no acute abnormality  MRI  patient refused   MRA  patient refused   Carotid Doppler pending  2D EchoLeft ventricle: The cavity size was normal. Wall thickness was   normal. Systolic function was vigorous. The estimated ejection   fraction was in the range of 65% to 70%. Wall motion was normal;    there  were no regional wall motion abnormalities  LDL - 57  HgbA1c 5.8  VTE prophylaxis - SCDs Diet regular Room service appropriate? Yes; Fluid consistency: Thin  clopidogrel 75 mg daily prior to admission, now on clopidogrel 75 mg daily   Patient counseled to be compliant with her antithrombotic medications  Ongoing aggressive stroke risk factor management  Therapy recommendations:  CIR  Disposition: Pending  Hypertension  Stable  Permissive hypertension (OK if < 220/120) but gradually  normalize in 5-7 days  Long-term BP goal normotensive  Hyperlipidemia  Home meds:  No lipid lowering medications prior to admission.  LDL 77, goal < 70  Consider low-dose Lipitor if MRI positive for stroke  Continue statin at discharge  Other Stroke Risk Factors  Cigarette smoker - advised to stop smoking  Obesity, Body mass index is 34.94 kg/m., recommend weight loss, diet and exercise as appropriate   NO stroke/TIA  Similar presentation in 12/2014 stroke admission, MRI neg. was admitted at [facility]  Coronary artery disease  Migraines  Other Active Problems  Transient chest pain - troponin enzymes - < 0.03  Hypokalemia - 3.1 -> supplemented -> 4.3   Mild leukocytosis - 11.6 -> 7.6  Hospital day # 2  I have personally examined this patient, reviewed notes, independently viewed imaging studies, participated in medical decision making and plan of care.ROS completed by me personally and pertinent positives fully documented  I have made any additions or clarifications directly to the above note. Agree with note above. Patient is refusing to undergo MRI and CT scan of the head 2 is negative for acute infarct. She still has subjective weakness and numbness on the left but her exam has nonorganic features. Patient is unwilling to stay further and finish stroke workup despite being counseled to do so. She wants to go home.  Antony Contras, MD Medical Director [facility] Pager: [phone] 04/05/2016 4:26 PM   To contact Stroke Continuity provider, please refer to [email or web address] After hours, contact General Neurology

## 2016-04-05 NOTE — Progress Notes (Signed)
Speech Language Pathology  Patient Details Name: Katrina [name] MRN: [id number] DOB: Jun 28, 1960 Today's Date: 04/05/2016 Time:  -     Pt screened for speech-language-cognitive abilities. Pt appeared apathetic to situation versus depression/psyc issues. Reports poor memory and when SLP suggested compensatory strategies, she negated both (politely). She is not interested in CIR for 3 weeks. Stated, to this SLP she would consider 1-2 weeks on CIR however writer doubts the amount of benefit pt would actually gain. No formal assessment needed.     Orbie Pyo [employer] [phone]

## 2016-04-05 NOTE — Progress Notes (Signed)
Pt unwilling to complete medical workup. Requesting to discharge home. Not a candidate for an inpt rehab admission at this time. [id number]

## 2016-04-05 NOTE — Progress Notes (Signed)
OT Cancellation Note  Patient Details Name: Katrina [name] MRN: [id number] DOB: June 07, 1961   Cancelled Treatment:    Reason Eval/Treat Not Completed: Patient at procedure or test/ unavailable. Pt currently out of room for test in ECHO lab.  Almon Register [id number] 04/05/2016, 12:44 PM

## 2016-04-05 NOTE — Progress Notes (Signed)
Physical Therapy Treatment Patient Details Name: Katrina [name] MRN: [id number] DOB: 18-Jun-1960 Today's Date: 04/05/2016    History of Present Illness Patient is a 55 yo female admitted 04/02/16 from [facility] with Lt-sided weakness/numbness.  Patient received tPA on 04/03/16 @ 0045. No CT evidence for acute intracranial abnormality. MRI refused by pt (unless she can be under anesthesia)   PMH:  Multiple admits with Lt sided weakness, Multiple Sclerosis, CAD, COPD, back pain, anxiety, HTN, HLD, lung nodules, MS, peripheral neuropathy, RLS    PT Comments    Patient with inconsistent strength performance of LUE and LLE throughout session. (See General Comments below for details). She is refusing inpatient rehab and wants to go home today. She walked 45 ft with minguard assist with RW and odd positioning/placement of LLE. There was no knee buckling or ankle instability. She stepped up/down one step with rail with min assist. She reports she'll "muscle through, as I've done before " to get up 8 outside steps to get in and then flight to bathroom. Then states she might bump up steps on her backside. Instructed to use second technique to increase safety (she endorses 3 falls down the interior stairs in recent months). She states she cannot use BSC in apartment and must go upstairs to the bathroom due to no one will empty the [facility] for her. Encouraged patient that she has made progress even from yesterday and to continue to work on use of LLE.    Follow Up Recommendations  Supervision for mobility/OOB (based on functional performance); Home health PT (pt refusing CIR; wants home today)     Equipment Recommendations  Wheelchair (pt reports could use in her apartment to get around however rolling walker would be priority if insurance only covers one); Rolling walker with 5" wheels    Recommendations for Other Services   ?prosthetist consult--pt interested, however anticipate Lt ankle will continue to  improve and in light of inconsistencies, would postpone to allow recovery.     Precautions / Restrictions Precautions Precautions: Fall Precaution Comments: Multiple falls.  Three falls recently down stairs. Restrictions Weight Bearing Restrictions: No    Mobility  Bed Mobility               General bed mobility comments: Patient sitting in chair.  Transfers Overall transfer level: Needs assistance Equipment used: Rolling walker (2 wheeled) Transfers: Sit to/from Stand Sit to Stand: Min guard         General transfer comment: Patient able to smoothly transition hands from armrests to RW grips (vc for Lt hand)  Ambulation/Gait Ambulation/Gait assistance: Min guard Ambulation Distance (Feet): 45 Feet Assistive device: Rolling walker (2 wheeled) Gait Pattern/deviations: Step-through pattern;Decreased stance time - right;Decreased stride length;Decreased dorsiflexion - left;Decreased weight shift to left   Gait velocity interpretation: Below normal speed for age/gender General Gait Details: patient use hip hike and trunk rotation to the right to advance her LLE (which is held in hip internal rotation, knee extension, ankle inverted with dorsiflexion--pt leads with lateral border of her foot). No ankle instabiity noted despite awkward foot placement. Vc to allow Lt knee flexion to shorten leg and advance more easily. Pt took 3 steps with knee "unlocking"/flexing with foot placed in near normal alignment and then reverted back to previous pattern. Attempted to assist knee flexion and could not overcome knee extension.     Stairs Stairs: Yes Stairs assistance: +2 safety/equipment Stair Management: One rail Right;Step to pattern;Backwards;Forwards Number of Stairs: 1 General stair comments:  ascend with min guard assist; descended leading with left foot with awkward foot placement and min assist to balance  Wheelchair Mobility    Modified Rankin (Stroke Patients  Only) Modified Rankin (Stroke Patients Only) Pre-Morbid Rankin Score: Moderate disability Modified Rankin: Moderately severe disability     Balance Overall balance assessment: Needs assistance         Standing balance support: No upper extremity supported Standing balance-Leahy Scale: Fair                      Cognition Arousal/Alertness: Awake/alert Behavior During Therapy: WFL for tasks assessed/performed;Flat affect Overall Cognitive Status: No family/caregiver present to determine baseline cognitive functioning (Decreased safety awareness baseline)                      Exercises Other Exercises Other Exercises: Educated to perform left toe wiggling (which she could weakly do) and Lt foot dorsiflexion)    General Comments General comments (skin integrity, edema, etc.): Noted patient with inconsistencies in strength and use of LUE and LLE. Unable to raise LUE on command, however when donned robe over RUE first, was able to lift left hand up to put lt hand/arm through the sleeve. In weightbearing on RW, her grip never slipped and appeared to put alot of weight through LUE as advancing & in stance with LLE. Pt unable to perform Lt knee extension in sitting, however able to maintain knee extension against moderate resistance when attempting to bend Lt knee.       Pertinent Vitals/Pain Pain Assessment: Faces Faces Pain Scale: Hurts little more Pain Location: low back  Pain Descriptors / Indicators: Aching Pain Intervention(s): Limited activity within patient's tolerance;Monitored during session;Repositioned    Home Living                      Prior Function            PT Goals (current goals can now be found in the care plan section) Acute Rehab PT Goals Patient Stated Goal: To go home Time For Goal Achievement: 04/11/16 Progress towards PT goals: Progressing toward goals    Frequency    Min 4X/week      PT Plan Discharge plan needs to  be updated;Equipment recommendations need to be updated    Co-evaluation             End of Session Equipment Utilized During Treatment: Gait belt Activity Tolerance: Patient tolerated treatment well Patient left: in chair;with call bell/phone within reach;with nursing/sitter in room     Time: 1311-1339 PT Time Calculation (min) (ACUTE ONLY): 28 min  Charges:  $Gait Training: 23-37 mins                    G Codes:      Josian Lanese 2016-04-28, 2:06 PM Pager [phone]

## 2016-04-05 NOTE — Care Management Note (Signed)
Case Management Note  Patient Details  Name: Katrina [name] MRN: [id number] Date of Birth: 1960-10-22  Subjective/Objective:                    Action/Plan: Pt discharged home with self care. PT recommended walker but pt left without equipment. No further needs per CM.   Expected Discharge Date:                  Expected Discharge Plan:  Home/Self Care  In-House Referral:     Discharge planning Services  CM Consult  Post Acute Care Choice:    Choice offered to:     DME Arranged:    DME Agency:     HH Arranged:    [place] Agency:     Status of Service:  Completed, signed off  If discussed at [employer] of Stay Meetings, dates discussed:    Additional Comments:  Pollie Friar, RN 04/05/2016, 4:48 PM

## 2016-04-06 LAB — VAS US CAROTID
LEFT ECA DIAS: -18 cm/s
LEFT VERTEBRAL DIAS: 10 cm/s
LICAPDIAS: -29 cm/s
Left CCA dist dias: -23 cm/s
Left CCA dist sys: -78 cm/s
Left CCA prox dias: 29 cm/s
Left CCA prox sys: 139 cm/s
Left ICA dist dias: -30 cm/s
Left ICA dist sys: -81 cm/s
Left ICA prox sys: -84 cm/s
RCCADSYS: -96 cm/s
RCCAPDIAS: 19 cm/s
RIGHT ECA DIAS: -20 cm/s
RIGHT VERTEBRAL DIAS: 26 cm/s
Right CCA prox sys: 106 cm/s

## 2016-04-16 ENCOUNTER — Other Ambulatory Visit: Payer: Self-pay | Admitting: Nurse Practitioner

## 2016-04-16 DIAGNOSIS — Z1231 Encounter for screening mammogram for malignant neoplasm of breast: Secondary | ICD-10-CM

## 2016-05-02 ENCOUNTER — Emergency Department (HOSPITAL_COMMUNITY): Payer: Medicare Other

## 2016-05-02 ENCOUNTER — Encounter (HOSPITAL_COMMUNITY): Payer: Self-pay | Admitting: Emergency Medicine

## 2016-05-02 ENCOUNTER — Inpatient Hospital Stay (HOSPITAL_COMMUNITY)
Admission: EM | Admit: 2016-05-02 | Discharge: 2016-05-04 | DRG: 312 | Disposition: A | Discharge status: Home / Self Care | Payer: Medicare Other | Attending: Internal Medicine | Admitting: Internal Medicine

## 2016-05-02 DIAGNOSIS — F40240 Claustrophobia: Secondary | ICD-10-CM | POA: Diagnosis present

## 2016-05-02 DIAGNOSIS — Z825 Family history of asthma and other chronic lower respiratory diseases: Secondary | ICD-10-CM

## 2016-05-02 DIAGNOSIS — G2581 Restless legs syndrome: Secondary | ICD-10-CM | POA: Diagnosis present

## 2016-05-02 DIAGNOSIS — E785 Hyperlipidemia, unspecified: Secondary | ICD-10-CM | POA: Diagnosis present

## 2016-05-02 DIAGNOSIS — F17210 Nicotine dependence, cigarettes, uncomplicated: Secondary | ICD-10-CM | POA: Diagnosis present

## 2016-05-02 DIAGNOSIS — F411 Generalized anxiety disorder: Secondary | ICD-10-CM | POA: Diagnosis present

## 2016-05-02 DIAGNOSIS — S7001XA Contusion of right hip, initial encounter: Secondary | ICD-10-CM

## 2016-05-02 DIAGNOSIS — Z0389 Encounter for observation for other suspected diseases and conditions ruled out: Secondary | ICD-10-CM

## 2016-05-02 DIAGNOSIS — IMO0001 Reserved for inherently not codable concepts without codable children: Secondary | ICD-10-CM | POA: Diagnosis present

## 2016-05-02 DIAGNOSIS — K219 Gastro-esophageal reflux disease without esophagitis: Secondary | ICD-10-CM | POA: Diagnosis present

## 2016-05-02 DIAGNOSIS — Z8673 Personal history of transient ischemic attack (TIA), and cerebral infarction without residual deficits: Secondary | ICD-10-CM

## 2016-05-02 DIAGNOSIS — Y92009 Unspecified place in unspecified non-institutional (private) residence as the place of occurrence of the external cause: Secondary | ICD-10-CM

## 2016-05-02 DIAGNOSIS — Z79899 Other long term (current) drug therapy: Secondary | ICD-10-CM

## 2016-05-02 DIAGNOSIS — J449 Chronic obstructive pulmonary disease, unspecified: Secondary | ICD-10-CM | POA: Diagnosis present

## 2016-05-02 DIAGNOSIS — Z720 Tobacco use: Secondary | ICD-10-CM | POA: Diagnosis present

## 2016-05-02 DIAGNOSIS — Z7902 Long term (current) use of antithrombotics/antiplatelets: Secondary | ICD-10-CM

## 2016-05-02 DIAGNOSIS — Z86010 Personal history of colonic polyps: Secondary | ICD-10-CM

## 2016-05-02 DIAGNOSIS — I10 Essential (primary) hypertension: Secondary | ICD-10-CM | POA: Diagnosis present

## 2016-05-02 DIAGNOSIS — I2510 Atherosclerotic heart disease of native coronary artery without angina pectoris: Secondary | ICD-10-CM | POA: Diagnosis present

## 2016-05-02 DIAGNOSIS — R55 Syncope and collapse: Secondary | ICD-10-CM | POA: Diagnosis not present

## 2016-05-02 DIAGNOSIS — Z8711 Personal history of peptic ulcer disease: Secondary | ICD-10-CM

## 2016-05-02 DIAGNOSIS — W108XXA Fall (on) (from) other stairs and steps, initial encounter: Secondary | ICD-10-CM | POA: Diagnosis present

## 2016-05-02 DIAGNOSIS — Z91018 Allergy to other foods: Secondary | ICD-10-CM

## 2016-05-02 DIAGNOSIS — I5189 Other ill-defined heart diseases: Secondary | ICD-10-CM | POA: Diagnosis present

## 2016-05-02 DIAGNOSIS — Z8249 Family history of ischemic heart disease and other diseases of the circulatory system: Secondary | ICD-10-CM

## 2016-05-02 DIAGNOSIS — E1140 Type 2 diabetes mellitus with diabetic neuropathy, unspecified: Secondary | ICD-10-CM | POA: Diagnosis present

## 2016-05-02 DIAGNOSIS — Z880 Allergy status to penicillin: Secondary | ICD-10-CM

## 2016-05-02 DIAGNOSIS — Z8614 Personal history of Methicillin resistant Staphylococcus aureus infection: Secondary | ICD-10-CM

## 2016-05-02 DIAGNOSIS — I639 Cerebral infarction, unspecified: Secondary | ICD-10-CM | POA: Diagnosis present

## 2016-05-02 DIAGNOSIS — F4310 Post-traumatic stress disorder, unspecified: Secondary | ICD-10-CM | POA: Diagnosis present

## 2016-05-02 DIAGNOSIS — Z9102 Food additives allergy status: Secondary | ICD-10-CM

## 2016-05-02 LAB — BASIC METABOLIC PANEL
ANION GAP: 5 (ref 5–15)
BUN: 15 mg/dL (ref 6–20)
CALCIUM: 8.6 mg/dL — AB (ref 8.9–10.3)
CHLORIDE: 107 mmol/L (ref 101–111)
CO2: 24 mmol/L (ref 22–32)
CREATININE: 0.67 mg/dL (ref 0.44–1.00)
GFR calc non Af Amer: >60 mL/min (ref >60)
Glucose, Bld: 105 mg/dL — ABNORMAL HIGH (ref 65–99)
Potassium: 3.6 mmol/L (ref 3.5–5.1)
SODIUM: 136 mmol/L (ref 135–145)

## 2016-05-02 LAB — URINALYSIS, ROUTINE W REFLEX MICROSCOPIC
Bilirubin Urine: NEGATIVE
Glucose, UA: NEGATIVE mg/dL
Ketones, ur: NEGATIVE mg/dL
LEUKOCYTES UA: NEGATIVE
Nitrite: NEGATIVE
PROTEIN: 30 mg/dL — AB
SPECIFIC GRAVITY, URINE: 1.015 (ref 1.005–1.030)
pH: 6 (ref 5.0–8.0)

## 2016-05-02 LAB — CK: CK TOTAL: 50 U/L (ref 38–234)

## 2016-05-02 LAB — GLUCOSE, CAPILLARY: Glucose-Capillary: 126 mg/dL — ABNORMAL HIGH (ref 65–99)

## 2016-05-02 LAB — URINE MICROSCOPIC-ADD ON

## 2016-05-02 LAB — TROPONIN I

## 2016-05-02 LAB — CBC
HCT: 39.6 % (ref 36.0–46.0)
HEMOGLOBIN: 12.8 g/dL (ref 12.0–15.0)
MCH: 29.6 pg (ref 26.0–34.0)
MCHC: 32.3 g/dL (ref 30.0–36.0)
MCV: 91.5 fL (ref 78.0–100.0)
PLATELETS: 239 10*3/uL (ref 150–400)
RBC: 4.33 MIL/uL (ref 3.87–5.11)
RDW: 14.2 % (ref 11.5–15.5)
WBC: 9.2 10*3/uL (ref 4.0–10.5)

## 2016-05-02 LAB — MAGNESIUM: Magnesium: 2.1 mg/dL (ref 1.7–2.4)

## 2016-05-02 LAB — CBG MONITORING, ED: GLUCOSE-CAPILLARY: 104 mg/dL — AB (ref 65–99)

## 2016-05-02 MED ORDER — ZOLPIDEM TARTRATE 5 MG PO TABS
5.0000 mg | ORAL_TABLET | Freq: Every evening | ORAL | Status: DC
  Administered 2016-05-02 – 2016-05-03 (×2): 5 mg via ORAL
  Filled 2016-05-02 (×2): qty 1

## 2016-05-02 MED ORDER — CLOPIDOGREL BISULFATE 75 MG PO TABS
75.0000 mg | ORAL_TABLET | Freq: Every day | ORAL | Status: DC
  Administered 2016-05-03 – 2016-05-04 (×2): 75 mg via ORAL
  Filled 2016-05-02 (×2): qty 1

## 2016-05-02 MED ORDER — PROPRANOLOL HCL ER 60 MG PO CP24
60.0000 mg | ORAL_CAPSULE | Freq: Every morning | ORAL | Status: DC
Start: 2016-05-03 — End: 2016-05-04
  Administered 2016-05-03: 60 mg via ORAL
  Filled 2016-05-02 (×3): qty 1

## 2016-05-02 MED ORDER — ENOXAPARIN SODIUM 40 MG/0.4ML ~~LOC~~ SOLN
40.0000 mg | SUBCUTANEOUS | Status: DC
  Filled 2016-05-02: qty 0.4

## 2016-05-02 MED ORDER — PANTOPRAZOLE SODIUM 40 MG PO TBEC
80.0000 mg | DELAYED_RELEASE_TABLET | Freq: Every day | ORAL | Status: DC
  Administered 2016-05-03 – 2016-05-04 (×2): 80 mg via ORAL
  Filled 2016-05-02 (×2): qty 2

## 2016-05-02 MED ORDER — SODIUM CHLORIDE 0.9% FLUSH
3.0000 mL | Freq: Two times a day (BID) | INTRAVENOUS | Status: DC
  Administered 2016-05-02 – 2016-05-03 (×2): 3 mL via INTRAVENOUS

## 2016-05-02 MED ORDER — SODIUM CHLORIDE 0.9% FLUSH
3.0000 mL | Freq: Two times a day (BID) | INTRAVENOUS | Status: DC
  Administered 2016-05-03 (×2): 3 mL via INTRAVENOUS

## 2016-05-02 MED ORDER — ALBUTEROL SULFATE (2.5 MG/3ML) 0.083% IN NEBU: 3.0000 mL | INHALATION_SOLUTION | RESPIRATORY_TRACT | Status: DC | PRN

## 2016-05-02 MED ORDER — CLONIDINE HCL 0.1 MG PO TABS: 0.1000 mg | ORAL_TABLET | Freq: Four times a day (QID) | ORAL | Status: DC

## 2016-05-02 MED ORDER — OXYCODONE-ACETAMINOPHEN 7.5-325 MG PO TABS
1.0000 | ORAL_TABLET | Freq: Four times a day (QID) | ORAL | Status: DC | PRN
  Administered 2016-05-03 – 2016-05-04 (×6): 1 via ORAL
  Filled 2016-05-02 (×6): qty 1

## 2016-05-02 MED ORDER — HYDROMORPHONE HCL 1 MG/ML IJ SOLN
1.0000 mg | Freq: Once | INTRAMUSCULAR | Status: AC
  Administered 2016-05-02: 1 mg via INTRAVENOUS
  Filled 2016-05-02: qty 1

## 2016-05-02 MED ORDER — CLONIDINE HCL 0.1 MG PO TABS
ORAL_TABLET | ORAL | Status: AC
  Filled 2016-05-02: qty 1

## 2016-05-02 MED ORDER — AMLODIPINE BESYLATE 5 MG PO TABS
10.0000 mg | ORAL_TABLET | Freq: Every day | ORAL | Status: DC
  Administered 2016-05-03 – 2016-05-04 (×2): 10 mg via ORAL
  Filled 2016-05-02 (×2): qty 2

## 2016-05-02 MED ORDER — GABAPENTIN 300 MG PO CAPS
600.0000 mg | ORAL_CAPSULE | Freq: Three times a day (TID) | ORAL | Status: DC
  Administered 2016-05-02 – 2016-05-04 (×6): 600 mg via ORAL
  Filled 2016-05-02 (×6): qty 2

## 2016-05-02 MED ORDER — DIAZEPAM 5 MG PO TABS
5.0000 mg | ORAL_TABLET | Freq: Three times a day (TID) | ORAL | Status: DC
  Filled 2016-05-02 (×3): qty 1

## 2016-05-02 MED ORDER — INSULIN ASPART 100 UNIT/ML ~~LOC~~ SOLN
0.0000 [IU] | Freq: Three times a day (TID) | SUBCUTANEOUS | Status: DC
  Administered 2016-05-03: 2 [IU] via SUBCUTANEOUS

## 2016-05-02 MED ORDER — ONDANSETRON HCL 4 MG/2ML IJ SOLN: 4.0000 mg | Freq: Four times a day (QID) | INTRAMUSCULAR | Status: DC | PRN

## 2016-05-02 MED ORDER — POTASSIUM CHLORIDE 20 MEQ PO PACK
20.0000 meq | PACK | Freq: Once | ORAL | Status: AC
  Administered 2016-05-02: 20 meq via ORAL
  Filled 2016-05-02: qty 1

## 2016-05-02 MED ORDER — ONDANSETRON HCL 4 MG PO TABS: 4.0000 mg | ORAL_TABLET | Freq: Four times a day (QID) | ORAL | Status: DC | PRN

## 2016-05-02 MED ORDER — CLONIDINE HCL 0.1 MG PO TABS
0.1000 mg | ORAL_TABLET | Freq: Once | ORAL | Status: AC
  Administered 2016-05-02: 0.1 mg via ORAL

## 2016-05-02 MED ORDER — ROPINIROLE HCL 1 MG PO TABS
4.0000 mg | ORAL_TABLET | Freq: Every day | ORAL | Status: DC
  Administered 2016-05-02 – 2016-05-03 (×2): 4 mg via ORAL
  Filled 2016-05-02 (×2): qty 4

## 2016-05-02 MED ORDER — NICOTINE 21 MG/24HR TD PT24
21.0000 mg | MEDICATED_PATCH | Freq: Every day | TRANSDERMAL | Status: DC
  Administered 2016-05-02 – 2016-05-04 (×3): 21 mg via TRANSDERMAL
  Filled 2016-05-02 (×3): qty 1

## 2016-05-02 MED ORDER — SODIUM CHLORIDE 0.9 % IV BOLUS (SEPSIS)
1000.0000 mL | Freq: Once | INTRAVENOUS | Status: AC
  Administered 2016-05-02: 1000 mL via INTRAVENOUS

## 2016-05-02 MED ORDER — BACLOFEN 10 MG PO TABS
10.0000 mg | ORAL_TABLET | Freq: Three times a day (TID) | ORAL | Status: DC
  Administered 2016-05-02 – 2016-05-04 (×6): 10 mg via ORAL
  Filled 2016-05-02 (×6): qty 1

## 2016-05-02 MED ORDER — POTASSIUM CHLORIDE IN NACL 40-0.9 MEQ/L-% IV SOLN: INTRAVENOUS | Status: DC

## 2016-05-02 NOTE — H&P (Signed)
History and Physical    Katrina [name] [id number] DOB: 1960-11-17 DOA: 05/02/2016  PCP: [place]   Patient coming from: Home.  Chief Complaint: "Blacked out yesterday".  HPI: Katrina [name] is a 55 y.o. female with medical history significant of anginal pain, anxiety, asthma/COPD, history of lung nodules, CAD, hypertension, diastolic dysfunction, chronic back pain, diverticulosis, GERD, PUD, migraine headaches, history of MRSA infection, hyperlipidemia, type 2 diabetes, peripheral neuropathy, restless leg syndrome, history of CVA/TIAs in the past who is coming to the emergency department after losing consciousness at home yesterday.  Per patient, after she woke up yesterday morning she started feeling lightheadedness, mild nausea, and frequent palpitations. Around 1300 the patient has significant palpitations, but continue with her daily activities. She is states that sometime between 1400 and 1500, she was going to feed her daughter's dogs in the last thing she remembers was being upstairs about to go down the stairs. The next thing she remembers is waking up and down stairs at the base of the stairs with right hip and right thigh pain. She has has some dyspnea, but is unable to tell if it is related to her current symptoms, given her history of COPD. She denies chest pain, diaphoresis, lower extremity edema, PND or orthopnea.  ED Course: The patient received a normal saline bolus. Her EKG was unchanged from previous and nonischemic. CBC was normal, urinalysis showed large hemoglobin and 30 mg of protein on chemical analysis, glucose was 105 and calcium 8.6 mg/dL. Hip and femur x-rays were negative for acute findings. Her CT of the head did not show any acute intracranial abnormalities.  Review of Systems: As per HPI otherwise 10 point review of systems negative.    Past Medical History:  Diagnosis Date  . Anginal pain ([place])   . Anxiety    takes Valium daily as needed    . Asthma    has inhalers but doesn't use  . CAD (coronary artery disease)    Moderate nonobstructive 2012-2013, [place]  . Chronic back pain    budlging disc   . COPD (chronic obstructive pulmonary disease) ([place])   . Diverticulitis   . Family history of adverse reaction to anesthesia    daughter gets extremely sick   . GERD (gastroesophageal reflux disease)    takes Dexilant daily  . History of blood transfusion 1975/1976   no abnormal reaction noted  . History of bronchitis 06/2015  . History of colitis   . History of colon polyps    benign  . History of gastric ulcer   . History of migraine    last one a few days ago  . History of MRSA infection   . Hyperlipidemia    was on meds but has been off over a yr  . Hypertension    takes Amlodipine and Maxzide daily  . Insomnia    takes Ambien nightly  . Lung nodules   . Microscopic hematuria   . MS (multiple sclerosis) ([place])    questionable per pt  . Peripheral neuropathy (HCC)    weakness,numbness,and tingling. Takes Gabapentin daily  . Pneumonia 06/2015  . Restless leg syndrome    takes Requip daily  . Stroke [facility])    takes Plavix daily.Left sided weakness    Past Surgical History:  Procedure Laterality Date  . ABDOMINAL HYSTERECTOMY    . ANKLE SURGERY Bilateral   . APPENDECTOMY    . BACK SURGERY     fusion  . CARDIAC CATHETERIZATION N/A  01/31/2015   Procedure: Left Heart Cath and Coronary Angiography;  Surgeon: Burnell Blanks, MD;  Location: [place] [facility];  Service: Cardiovascular;  Laterality: N/A;  . CHOLECYSTECTOMY    . COLONOSCOPY    . ESOPHAGOGASTRODUODENOSCOPY    . NOSE SURGERY    . RADIOLOGY WITH ANESTHESIA N/A 01/15/2016   Procedure: MRI LUMBAR SPINE WITHOUT;  Surgeon: Medication Radiologist, MD;  Location: [place];  Service: Radiology;  Laterality: N/A;  . TONSILLECTOMY    . 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  She started smoking about 45 years ago. She has a 88.00 pack-year smoking history. She uses smokeless tobacco. She reports that she does not drink alcohol or use drugs.  Allergies  Allergen Reactions  . Penicillins Anaphylaxis    Has patient had a PCN reaction causing immediate rash, facial/tongue/throat swelling, SOB or lightheadedness with hypotension: Yes Has patient had a PCN reaction causing severe rash involving mucus membranes or skin necrosis: Yes Has patient had a PCN reaction that required hospitalization Yes Has patient had a PCN reaction occurring within the last 10 years: No If all of the above answers are "NO", then may proceed with Cephalosporin use.   . Zithromax [Azithromycin] Anaphylaxis  . Aspirin Other (See Comments)    Due to stomach ulcers.   . Pineapple Rash  . Strawberry Extract Rash  . Aspartame And Phenylalanine Palpitations  . Mushroom Extract Complex Rash  . Nicardipine Nausea And Vomiting and Other (See Comments)    shaking  . Poultry Meal Other (See Comments)    Allergic to [place]    Family History  Problem Relation Age of Onset  . Coronary artery disease Father 68  . Emphysema Father   . Depression Mother   . Bipolar disorder Brother   . Drug abuse Brother   . Bipolar disorder Daughter     Prior to Admission medications   Medication Sig Start Date End Date Taking? Authorizing Provider  albuterol (PROVENTIL HFA;VENTOLIN HFA) 108 (90 Base) MCG/ACT inhaler Inhale 1 puff into the lungs every 4 (four) hours as needed for wheezing.   Yes Historical Provider, MD  amLODipine (NORVASC) 10 MG tablet Take 1 tablet (10 mg total) by mouth daily. 06/05/14  Yes Herminio Commons, MD  baclofen (LIORESAL) 10 MG tablet Take 10 mg by mouth 3 (three) times daily.  02/26/16  Yes Historical Provider, MD  clopidogrel (PLAVIX) 75 MG tablet Take 75 mg by mouth daily.   Yes Historical Provider, MD  dexlansoprazole (DEXILANT) 60 MG capsule Take 1 capsule (60  mg total) by mouth every evening. Patient taking differently: Take 60 mg by mouth daily.  03/20/14  Yes Rexene Alberts, MD  diazepam (VALIUM) 5 MG tablet Take 5 mg by mouth 3 (three) times daily.  04/25/16  Yes Historical Provider, MD  EPINEPHrine 0.3 mg/0.3 mL IJ SOAJ injection Inject 0.3 mg into the muscle once.    Yes Historical Provider, MD  gabapentin (NEURONTIN) 600 MG tablet Take 1 tablet (600 mg total) by mouth 3 (three) times daily. 04/05/16  Yes [name], NP  oxyCODONE-acetaminophen (PERCOCET) 7.5-325 MG tablet Take 1 tablet by mouth every 8 (eight) hours as needed for pain. 03/25/16  Yes Historical Provider, MD  propranolol ER (INDERAL LA) 60 MG 24 hr capsule Take 1 capsule (60 mg total) by mouth at bedtime. Patient taking differently:  Take 60 mg by mouth every morning.  04/05/16  Yes [name], NP  rOPINIRole (REQUIP) 4 MG tablet Take 4 mg by mouth at bedtime.   Yes Historical Provider, MD  zolpidem (AMBIEN) 10 MG tablet Take 10 mg by mouth every evening.    Yes Historical Provider, MD  nicotine (NICODERM CQ - DOSED IN MG/24 HOURS) 14 mg/24hr patch Place 1 patch (14 mg total) onto the skin daily. Patient not taking: Reported on 05/02/2016 04/06/16   [name], NP    Physical Exam:  Constitutional: NAD, calm, comfortable Vitals:   05/02/16 1704 05/02/16 1800 05/02/16 1819 05/02/16 1924  BP: 189/67 (!) 219/92 (!) 219/92 (!) 194/102  Pulse: 62 63 74 63  Resp: 18  20 18   Temp:   98.7 F (37.1 C) 98.6 F (37 C)  TempSrc:   Oral Oral  SpO2: 100% 96% 99% 100%  Weight:    97.8 kg (215 lb 8 oz)  Height:    5' 6.5" (1.689 m)   Eyes: PERRL, lids and conjunctivae normal ENMT: Mucous membranes are moist. Posterior pharynx clear of any exudate or lesions.Upper dentures and partially absent lower dentition.  Neck: normal, supple, no masses, no thyromegaly Respiratory: clear to auscultation bilaterally, no wheezing, no crackles. Normal respiratory effort. No accessory muscle  use.  Cardiovascular: Regular rate and rhythm, no murmurs / rubs / gallops. No extremity edema. 2+ pedal pulses. No carotid bruits.  Abdomen: no tenderness, no masses palpated. No hepatosplenomegaly. Bowel sounds positive.  Musculoskeletal: no clubbing / cyanosis. Mildly decreased ROM on all extremities, right hip and LE tenderness, no contractures. Normal muscle tone.  Skin: no rashes, lesions, ulcers. No induration Neurologic: Mild left facial droop (baseline). Sensation intact, DTR normal. 4/5 left-sided hemiparesis (baseline).  Psychiatric: Normal judgment and insight. Alert and oriented x 4. Normal mood.    Labs on Admission: I have personally reviewed following labs and imaging studies  CBC:  Recent Labs Lab 05/02/16 1520  WBC 9.2  HGB 12.8  HCT 39.6  MCV 91.5  PLT [id number]   Basic Metabolic Panel:  Recent Labs Lab 05/02/16 1520  NA 136  K 3.6  CL 107  CO2 24  GLUCOSE 105*  BUN 15  CREATININE 0.67  CALCIUM 8.6*   GFR: Estimated Creatinine Clearance: 94.6 mL/min (by C-G formula based on SCr of 0.67 mg/dL). Liver Function Tests: No results for input(s): AST, ALT, ALKPHOS, BILITOT, PROT, ALBUMIN in the last 168 hours. No results for input(s): LIPASE, AMYLASE in the last 168 hours. No results for input(s): AMMONIA in the last 168 hours. Coagulation Profile: No results for input(s): INR, PROTIME in the last 168 hours. Cardiac Enzymes:  Recent Labs Lab 05/02/16 1537  CKTOTAL 50   BNP (last 3 results) No results for input(s): PROBNP in the last 8760 hours. HbA1C: No results for input(s): HGBA1C in the last 72 hours. CBG:  Recent Labs Lab 05/02/16 1824  GLUCAP 104*   Lipid Profile: No results for input(s): CHOL, HDL, LDLCALC, TRIG, CHOLHDL, LDLDIRECT in the last 72 hours. Thyroid Function Tests: No results for input(s): TSH, T4TOTAL, FREET4, T3FREE, THYROIDAB in the last 72 hours. Anemia Panel: No results for input(s): VITAMINB12, FOLATE, FERRITIN, TIBC,  IRON, RETICCTPCT in the last 72 hours. Urine analysis:    Component Value Date/Time   COLORURINE YELLOW 05/02/2016 [place] 05/02/2016 1520   LABSPEC 1.015 05/02/2016 1520   PHURINE 6.0 05/02/2016 1520   GLUCOSEU NEGATIVE 05/02/2016 1520  HGBUR LARGE (A) 05/02/2016 1520   BILIRUBINUR NEGATIVE 05/02/2016 1520   KETONESUR NEGATIVE 05/02/2016 1520   PROTEINUR 30 (A) 05/02/2016 1520   UROBILINOGEN 0.2 01/23/2015 2255   NITRITE NEGATIVE 05/02/2016 [place] 05/02/2016 1520    Radiological Exams on Admission: Ct Head Wo Contrast  Result Date: 05/02/2016 CLINICAL DATA:  Syncope, found down, right hip and leg pain. Patient describes additional history of hypertension, TIAs and strokes. Clinical data supplied on brain MRI report of 10/25/2015 indicated additional history of multiple sclerosis. EXAM: CT HEAD WITHOUT CONTRAST TECHNIQUE: Contiguous axial images were obtained from the base of the skull through the vertex without intravenous contrast. COMPARISON:  Head CTs dated 04/04/2016 and 04/03/2016. Brain MRI dated 10/25/2015. FINDINGS: Brain: Ventricles are normal in size and configuration. There is no mass, hemorrhage, edema or other evidence of acute parenchymal abnormality. No extra-axial hemorrhage. Subtle low-density foci at the interface of the subcortical white matter an overlying cortical gray matter corresponds to the nonspecific T2 hyperintensities shown on brain MRI of 10/25/2015, compatible with mild chronic microvascular ischemic change, possibly associated with the previous history suggesting multiple sclerosis. Vascular: There are chronic calcified atherosclerotic changes of the large vessels at the skull base. No unexpected hyperdense vessel. Skull: Normal. Negative for fracture or focal lesion. Sinuses/Orbits: No acute finding. Other: None. IMPRESSION: 1. No acute findings.  No intracranial mass, hemorrhage or edema. 2. Subtle low-density foci at  the peripheral margin of the subcortical white matter, corresponding to the nonspecific T2 hyperintensities shown on brain MRI of 10/25/2015, compatible with mild chronic microvascular ischemic change, possibly associated with previous history suggesting multiple sclerosis. Electronically Signed   By: Franki Cabot M.D.   On: 05/02/2016 16:13   Dg Hip Unilat W Or Wo Pelvis 2-3 Views Right  Result Date: 05/02/2016 CLINICAL DATA:  [place] down stairs yesterday, right hip pain EXAM: DG HIP (WITH OR WITHOUT PELVIS) 2-3V RIGHT COMPARISON:  None. FINDINGS: Three views of the right hip submitted. No acute fracture or subluxation. No radiopaque foreign body. IMPRESSION: Negative. Electronically Signed   By: Lahoma Crocker M.D.   On: 05/02/2016 16:07   Dg Femur Min 2 Views Right  Result Date: 05/02/2016 CLINICAL DATA:  Fall down stairs yesterday, right hip and leg pain, initial encounter. EXAM: RIGHT FEMUR 2 VIEWS COMPARISON:  None. FINDINGS: No acute osseous abnormality.  No joint effusion in the right knee. IMPRESSION: No acute findings. Electronically Signed   By: Lorin Picket M.D.   On: 05/02/2016 16:27   Echocardiogram 04/05/2016 ------------------------------------------------------------------- LV EF: 65% -   70%  ------------------------------------------------------------------- Indications:      CVA 436.  ------------------------------------------------------------------- History:   Risk factors:  Current tobacco use. Hypertension.  ------------------------------------------------------------------- Study Conclusions  - Left ventricle: The cavity size was normal. Wall thickness was   normal. Systolic function was vigorous. The estimated ejection   fraction was in the range of 65% to 70%. Wall motion was normal;   there were no regional wall motion abnormalities. Doppler   parameters are consistent with abnormal left ventricular   relaxation (grade 1 diastolic dysfunction).  EKG:  Independently reviewed. Vent. rate 74 BPM PR interval * ms QRS duration 93 ms QT/QTc 418/464 ms P-R-T axes 72 49 47 Normal sinus rhythm Probable left atrial enlargement No significant changes when compared to 03/2016  Assessment/Plan Principal Problem:   Syncope Admit to telemetry/observation. The patient declined MRI evaluation due to severe claustrophobia. She had recent hemoglobin A1c and lipid profile done.  Check carotid Doppler in a.m. I advised her to have her physicians refer her to [facility] for full sedation, so she can finally be evaluated for multiple sclerosis and get better resolution of brain images. Outpatient follow-up with neurology and/or cardiology.  Active Problems:   COPD (chronic obstructive pulmonary disease) (HCC) Supplemental oxygen and bronchodilators as needed.    GERD (gastroesophageal reflux disease) Protonix 40 mg by mouth daily.    Tobacco abuse Nicotine replacement therapy offered. Smoking cessation information provided.    Diastolic dysfunction Compensated at this time. Not currently on ACE inhibitor or diuretic. On propranolol.      CAD (coronary artery disease) Continue clopidogrel 75 mg by mouth daily. Continue the Inderal ER 60 mg by mouth daily    History of CVA (cerebral vascular accident) ([place]) Continue daily Plavix. Needs better long-term blood pressure control. Advised the patient about smoking cessation.    Generalized anxiety disorder   PTSD (post-traumatic stress disorder) Continue anxiolytics as needed. Continue Inderal 60 mg by mouth daily.    Benign essential HTN Continue amlodipine 10 mg by mouth daily. Continue Inderal ER 60 mg by mouth daily. Monitor blood pressure closely and adjust therapy as needed.    Type 2 diabetes mellitus with diabetic neuropathy (HCC) Hemoglobin A1c was 5.8% on 04/04/2016. Carbohydrate modified diet. CBG monitoring with regular insulin sliding scale. Continue gabapentin  600 mg by mouth 3 times a day.    Restless leg syndrome Continue Requip 4 mg po at bedtime.   DVT prophylaxis: Lovenox SQ. Code Status: Full code. Family Communication: * Disposition Plan: Admit for further evaluation and syncope workup. Consults called:  Admission status: Observation/telemetry.   Reubin Milan MD [facility] Pager [phone].  If 7PM-7AM, please contact night-coverage [email or web address] Password [facility]  05/02/2016, 7:45 PM

## 2016-05-02 NOTE — ED Triage Notes (Signed)
Patient states that she was walking down steps yesterday and "blacked out" and woke at the bottom of stairs. Patient states that she laid in the floor for several hours before she was able to get up. Patient c/o right hip and leg pain. Per patient this has happened many times in the past due to HTN, TIAs, and strokes. Per patient did not come for evaluation yesterday because she had no way to get here.

## 2016-05-02 NOTE — ED Provider Notes (Signed)
[facility] DEPT Provider Note   CSN: [id number] Arrival date & time: 05/02/16  1413     History   Chief Complaint Chief Complaint  Patient presents with  . Loss of Consciousness    HPI Katrina [name] is a 55 y.o. female.  HPI  C52-year-old female presents after a fall yesterday. She states she thinks she passed out. She was at the top of the stairs and the next thing she remembers she was at the bottom stairs. She has had right hip and thigh pain since. She remembers feeling lightheaded all day yesterday and some today. She denies any chest pain but states her heart was racing from about 1:00 until she passed out. She states she's had a headache since the fall although she gets headaches frequently and this is not particularly different. She considers it mild at this time. She laid on the floor several hours until she could crawl onto the couch. Patient states she has passed out before from high blood pressure. Typically her blood pressure is in the 200s per her despite being on medicine. She has chronic left-sided weakness from prior strokes that is not worse today than typical. Denies any neck pain or back pain. No recent nausea or vomiting or cough or chest pain. No shortness of breath. Pain is severe. Is on plavix for prior CVAs. Unable to bear weight due to pain in right leg.  Past Medical History:  Diagnosis Date  . Anginal pain ([place])   . Anxiety    takes Valium daily as needed  . Asthma    has inhalers but doesn't use  . CAD (coronary artery disease)    Moderate nonobstructive 2012-2013, [place]  . Chronic back pain    budlging disc   . COPD (chronic obstructive pulmonary disease) ([place])   . Diverticulitis   . Family history of adverse reaction to anesthesia    daughter gets extremely sick   . GERD (gastroesophageal reflux disease)    takes Dexilant daily  . History of blood transfusion 1975/1976   no abnormal reaction noted  . History of bronchitis 06/2015  .  History of colitis   . History of colon polyps    benign  . History of gastric ulcer   . History of migraine    last one a few days ago  . History of MRSA infection   . Hyperlipidemia    was on meds but has been off over a yr  . Hypertension    takes Amlodipine and Maxzide daily  . Insomnia    takes Ambien nightly  . Lung nodules   . Microscopic hematuria   . MS (multiple sclerosis) ([place])    questionable per pt  . Peripheral neuropathy ([facility])    weakness,numbness,and tingling. Takes Gabapentin daily  . Pneumonia 06/2015  . Restless leg syndrome    takes Requip daily  . Stroke [facility])    takes Plavix daily.Left sided weakness    Patient Active Problem List   Diagnosis Date Noted  . Syncope 05/02/2016  . Cerebrovascular accident (CVA) ([place])   . Vascular headache   . History of chest pain   . Coronary artery disease involving native coronary artery of native heart without angina pectoris   . Generalized anxiety disorder   . Chronic bilateral low back pain without sciatica   . Peripheral neuropathy ([place])   . Migraine without status migrainosus, not intractable   . Benign essential HTN   . PTSD (post-traumatic stress disorder)   .  History of cerebrovascular accident (CVA) with residual deficit   . CVA (cerebral vascular accident) ([place]) 04/03/2016  . Stroke (cerebrum) ([place]) 04/03/2016  . Post traumatic stress disorder 10/06/2015  . Precordial pain   . Migraine syndrome 01/24/2015  . Migraine 01/24/2015  . Accelerated hypertension 12/25/2014  . TIA (transient ischemic attack) 12/25/2014  . Hypertensive urgency   . Other chest pain   . Malignant hypertension   . Hypokalemia 04/16/2014  . Muscle weakness (generalized) 04/05/2014  . Stiffness of left hip joint 04/05/2014  . Pain in left hip 04/05/2014  . Diastolic dysfunction [id number]  . CAD (coronary artery disease) 03/20/2014  . PUD (peptic ulcer disease) 03/20/2014  . Type 2 diabetes, uncontrolled, with neuropathy  ([place]) 03/20/2014  . Morbid obesity ([place]) 03/20/2014  . Left-sided weakness 03/20/2014  . Paresthesias 03/20/2014  . Cerebrovascular disease 03/20/2014  . Dyspnea 03/05/2012  . Chest pain 03/05/2012  . Tobacco abuse 03/05/2012  . COPD (chronic obstructive pulmonary disease) ([place]) 11/23/2011  . GERD (gastroesophageal reflux disease) 11/23/2011  . HTN (hypertension) 11/23/2011    Past Surgical History:  Procedure Laterality Date  . ABDOMINAL HYSTERECTOMY    . ANKLE SURGERY Bilateral   . APPENDECTOMY    . BACK SURGERY     fusion  . CARDIAC CATHETERIZATION N/A 01/31/2015   Procedure: Left Heart Cath and Coronary Angiography;  Surgeon: Burnell Blanks, MD;  Location: [facility] CV LAB;  Service: Cardiovascular;  Laterality: N/A;  . CHOLECYSTECTOMY    . COLONOSCOPY    . ESOPHAGOGASTRODUODENOSCOPY    . NOSE SURGERY    . RADIOLOGY WITH ANESTHESIA N/A 01/15/2016   Procedure: MRI LUMBAR SPINE WITHOUT;  Surgeon: Medication Radiologist, MD;  Location: [facility];  Service: Radiology;  Laterality: N/A;  . TONSILLECTOMY    . 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 tumor removed from left breast      OB History    No data available       Home Medications    Prior to Admission medications   Medication Sig Start Date End Date Taking? Authorizing Provider  albuterol (PROVENTIL HFA;VENTOLIN HFA) 108 (90 Base) MCG/ACT inhaler Inhale 1 puff into the lungs every 4 (four) hours as needed for wheezing.   Yes Historical Provider, MD  amLODipine (NORVASC) 10 MG tablet Take 1 tablet (10 mg total) by mouth daily. 06/05/14  Yes Herminio Commons, MD  baclofen (LIORESAL) 10 MG tablet Take 10 mg by mouth 3 (three) times daily.  02/26/16  Yes Historical Provider, MD  clopidogrel (PLAVIX) 75 MG tablet Take 75 mg by mouth daily.   Yes Historical Provider, MD  dexlansoprazole (DEXILANT) 60 MG capsule Take 1 capsule (60 mg total) by mouth every evening. Patient taking differently: Take 60 mg by mouth  daily.  03/20/14  Yes Rexene Alberts, MD  diazepam (VALIUM) 5 MG tablet Take 5 mg by mouth 3 (three) times daily.  04/25/16  Yes Historical Provider, MD  EPINEPHrine 0.3 mg/0.3 mL IJ SOAJ injection Inject 0.3 mg into the muscle once.    Yes Historical Provider, MD  gabapentin (NEURONTIN) 600 MG tablet Take 1 tablet (600 mg total) by mouth 3 (three) times daily. 04/05/16  Yes [name], NP  oxyCODONE-acetaminophen (PERCOCET) 7.5-325 MG tablet Take 1 tablet by mouth every 8 (eight) hours as needed for pain. 03/25/16  Yes Historical Provider, MD  propranolol ER (INDERAL LA) 60 MG 24 hr capsule Take 1 capsule (60 mg total) by mouth at  bedtime. Patient taking differently: Take 60 mg by mouth every morning.  04/05/16  Yes [name], NP  rOPINIRole (REQUIP) 4 MG tablet Take 4 mg by mouth at bedtime.   Yes Historical Provider, MD  zolpidem (AMBIEN) 10 MG tablet Take 10 mg by mouth every evening.    Yes Historical Provider, MD  nicotine (NICODERM CQ - DOSED IN MG/24 HOURS) 14 mg/24hr patch Place 1 patch (14 mg total) onto the skin daily. Patient not taking: Reported on 05/02/2016 04/06/16   [name], NP    Family History Family History  Problem Relation Age of Onset  . Coronary artery disease Father 75  . Emphysema Father   . Depression Mother   . Bipolar disorder Brother   . Drug abuse Brother   . Bipolar disorder Daughter     Social History Social History  Substance Use Topics  . Smoking status: Current Every Day Smoker    Packs/day: 2.00    Years: 44.00    Types: Cigarettes    Start date: 03/04/1971  . Smokeless tobacco: Current User     Comment: Vapor Cigs  . Alcohol use No     Comment: 10-06-15 per pt no      Allergies   Penicillins; Zithromax [azithromycin]; Aspirin; Pineapple; Strawberry extract; Aspartame and phenylalanine; Mushroom extract complex; Nicardipine; and Poultry meal   Review of Systems Review of Systems  Constitutional: Negative for fever.    Respiratory: Negative for shortness of breath.   Cardiovascular: Positive for palpitations. Negative for chest pain.  Gastrointestinal: Negative for abdominal pain, diarrhea and vomiting.  Musculoskeletal: Positive for arthralgias. Negative for back pain.  Neurological: Positive for syncope, weakness (chronic, unchanged), light-headedness and headaches.  All other systems reviewed and are negative.    Physical Exam Updated Vital Signs BP (!) 219/92 (BP Location: Right Arm)   Pulse 74   Temp 98.7 F (37.1 C) (Oral)   Resp 20   Ht 5' 6.5" (1.689 m)   Wt 217 lb (98.4 kg)   SpO2 99%   BMI 34.50 kg/m   Physical Exam  Constitutional: She is oriented to person, place, and time. She appears well-developed and well-nourished. No distress.  HENT:  Head: Normocephalic and atraumatic.  Right Ear: External ear normal.  Left Ear: External ear normal.  Nose: Nose normal.  Eyes: Right eye exhibits no discharge. Left eye exhibits no discharge.  Neck: Normal range of motion. Neck supple. No spinous process tenderness and no muscular tenderness present.  Cardiovascular: Normal rate, regular rhythm and normal heart sounds.   No murmur heard. Pulmonary/Chest: Effort normal and breath sounds normal.  Abdominal: Soft. She exhibits no distension. There is no tenderness.  Musculoskeletal:       Right hip: She exhibits tenderness (lateral). She exhibits normal range of motion.       Right knee: She exhibits normal range of motion. No tenderness found.       Thoracic back: She exhibits no tenderness.       Lumbar back: She exhibits no tenderness.       Right upper leg: She exhibits tenderness (diffuse, lateral).  Neurological: She is alert and oriented to person, place, and time.  EOM normal except for unable to move to left (this is normal per her from prior CVA). Slight left facial droop (normal per her). 5/5 str in RUE, RLE. 4/5 strength in LUE, LLE though there seems to be some give away. Normal  sensation  Skin: Skin is warm  and dry. She is not diaphoretic.  Nursing note and vitals reviewed.    ED Treatments / Results  Labs (all labs ordered are listed, but only abnormal results are displayed) Labs Reviewed  BASIC METABOLIC PANEL - Abnormal; Notable for the following:       Result Value   Glucose, Bld 105 (*)    Calcium 8.6 (*)    All other components within normal limits  URINALYSIS, ROUTINE W REFLEX MICROSCOPIC (NOT AT [facility] - Abnormal; Notable for the following:    Hgb urine dipstick LARGE (*)    Protein, ur 30 (*)    All other components within normal limits  URINE MICROSCOPIC-ADD ON - Abnormal; Notable for the following:    Squamous Epithelial / LPF 0-5 (*)    Bacteria, UA FEW (*)    All other components within normal limits  CBC  CK  CBG MONITORING, ED    EKG  EKG Interpretation  Date/Time:  Sunday May 02 2016 14:56:24 EST Ventricular Rate:  74 PR Interval:    QRS Duration: 93 QT Interval:  418 QTC Calculation: 464 R Axis:   49 Text Interpretation:  Normal sinus rhythm Probable left atrial enlargement no significant change since Oct 2017 Confirmed by Regenia Skeeter MD, Nishanth Mccaughan [id number]) on 05/02/2016 2:59:28 PM       Radiology Ct Head Wo Contrast  Result Date: 05/02/2016 CLINICAL DATA:  Syncope, found down, right hip and leg pain. Patient describes additional history of hypertension, TIAs and strokes. Clinical data supplied on brain MRI report of 10/25/2015 indicated additional history of multiple sclerosis. EXAM: CT HEAD WITHOUT CONTRAST TECHNIQUE: Contiguous axial images were obtained from the base of the skull through the vertex without intravenous contrast. COMPARISON:  Head CTs dated 04/04/2016 and 04/03/2016. Brain MRI dated 10/25/2015. FINDINGS: Brain: Ventricles are normal in size and configuration. There is no mass, hemorrhage, edema or other evidence of acute parenchymal abnormality. No extra-axial hemorrhage. Subtle low-density foci at the interface  of the subcortical white matter an overlying cortical gray matter corresponds to the nonspecific T2 hyperintensities shown on brain MRI of 10/25/2015, compatible with mild chronic microvascular ischemic change, possibly associated with the previous history suggesting multiple sclerosis. Vascular: There are chronic calcified atherosclerotic changes of the large vessels at the skull base. No unexpected hyperdense vessel. Skull: Normal. Negative for fracture or focal lesion. Sinuses/Orbits: No acute finding. Other: None. IMPRESSION: 1. No acute findings.  No intracranial mass, hemorrhage or edema. 2. Subtle low-density foci at the peripheral margin of the subcortical white matter, corresponding to the nonspecific T2 hyperintensities shown on brain MRI of 10/25/2015, compatible with mild chronic microvascular ischemic change, possibly associated with previous history suggesting multiple sclerosis. Electronically Signed   By: Franki Cabot M.D.   On: 05/02/2016 16:13   Dg Hip Unilat W Or Wo Pelvis 2-3 Views Right  Result Date: 05/02/2016 CLINICAL DATA:  [place] [address] down stairs yesterday, right hip pain EXAM: DG HIP (WITH OR WITHOUT PELVIS) 2-3V RIGHT COMPARISON:  None. FINDINGS: Three views of the right hip submitted. No acute fracture or subluxation. No radiopaque foreign body. IMPRESSION: Negative. Electronically Signed   By: Lahoma Crocker M.D.   On: 05/02/2016 16:07   Dg Femur Min 2 Views Right  Result Date: 05/02/2016 CLINICAL DATA:  Fall down stairs yesterday, right hip and leg pain, initial encounter. EXAM: RIGHT FEMUR 2 VIEWS COMPARISON:  None. FINDINGS: No acute osseous abnormality.  No joint effusion in the right knee. IMPRESSION: No acute findings. Electronically Signed  By: Lorin Picket M.D.   On: 05/02/2016 16:27    Procedures Procedures (including critical care time)  Medications Ordered in ED Medications  HYDROmorphone (DILAUDID) injection 1 mg (1 mg Intravenous Given 05/02/16 1528)  sodium  chloride 0.9 % bolus 1,000 mL (1,000 mLs Intravenous New Bag/Given 05/02/16 1528)     Initial Impression / Assessment and Plan / ED Course  I have reviewed the triage vital signs and the nursing notes.  Pertinent labs & imaging results that were available during my care of the patient were reviewed by me and considered in my medical decision making (see chart for details).  Clinical Course as of May 02 1820  Nancy Fetter May 02, 2016  1523 No indication this is a new CVA with baseline neuro exam per her. Labs, xrays, CT head given headache after fall on plavix.  [SG]  [id number] Patient ambulated to the bathroom with a limp but was able to bear weight  [SG]    Clinical Course User Index [SG] Sherwood Gambler, MD    Dr. Olevia Bowens to admit for syncope with palpitations. She has no signs of arrhythmia currently. X-ray is unremarkable of her hip and she is able to a bili with a cane at her baseline. While it's painful my suspicion for occult hip fracture is very low.  Final Clinical Impressions(s) / ED Diagnoses   Final diagnoses:  Syncope, unspecified syncope type  Contusion of right hip, initial encounter    New Prescriptions New Prescriptions   No medications on file     Sherwood Gambler, MD 05/02/16 [place]elle Emerald

## 2016-05-02 NOTE — ED Notes (Signed)
Pt states that Clonidine tabs make her sick.  She has been placed on the clonidine patch by her doctor of 0.3 mg.  She did take the 0.1 clonidine tab PO.

## 2016-05-02 NOTE — ED Notes (Signed)
Attempted to call report

## 2016-05-03 ENCOUNTER — Observation Stay (HOSPITAL_COMMUNITY)
Admit: 2016-05-03 | Discharge: 2016-05-03 | Disposition: A | Discharge status: Home / Self Care | Payer: Medicare Other | Attending: Internal Medicine | Admitting: Internal Medicine

## 2016-05-03 ENCOUNTER — Observation Stay (HOSPITAL_COMMUNITY): Payer: Medicare Other

## 2016-05-03 DIAGNOSIS — S7001XA Contusion of right hip, initial encounter: Secondary | ICD-10-CM | POA: Diagnosis present

## 2016-05-03 DIAGNOSIS — I519 Heart disease, unspecified: Secondary | ICD-10-CM | POA: Diagnosis not present

## 2016-05-03 DIAGNOSIS — Z8711 Personal history of peptic ulcer disease: Secondary | ICD-10-CM | POA: Diagnosis not present

## 2016-05-03 DIAGNOSIS — Z7902 Long term (current) use of antithrombotics/antiplatelets: Secondary | ICD-10-CM | POA: Diagnosis not present

## 2016-05-03 DIAGNOSIS — Z880 Allergy status to penicillin: Secondary | ICD-10-CM | POA: Diagnosis not present

## 2016-05-03 DIAGNOSIS — F411 Generalized anxiety disorder: Secondary | ICD-10-CM | POA: Diagnosis present

## 2016-05-03 DIAGNOSIS — I2510 Atherosclerotic heart disease of native coronary artery without angina pectoris: Secondary | ICD-10-CM | POA: Diagnosis present

## 2016-05-03 DIAGNOSIS — W108XXA Fall (on) (from) other stairs and steps, initial encounter: Secondary | ICD-10-CM | POA: Diagnosis present

## 2016-05-03 DIAGNOSIS — F4310 Post-traumatic stress disorder, unspecified: Secondary | ICD-10-CM | POA: Diagnosis present

## 2016-05-03 DIAGNOSIS — E785 Hyperlipidemia, unspecified: Secondary | ICD-10-CM | POA: Diagnosis present

## 2016-05-03 DIAGNOSIS — Z8614 Personal history of Methicillin resistant Staphylococcus aureus infection: Secondary | ICD-10-CM | POA: Diagnosis not present

## 2016-05-03 DIAGNOSIS — F40240 Claustrophobia: Secondary | ICD-10-CM | POA: Diagnosis present

## 2016-05-03 DIAGNOSIS — Z8673 Personal history of transient ischemic attack (TIA), and cerebral infarction without residual deficits: Secondary | ICD-10-CM | POA: Diagnosis not present

## 2016-05-03 DIAGNOSIS — J449 Chronic obstructive pulmonary disease, unspecified: Secondary | ICD-10-CM | POA: Diagnosis present

## 2016-05-03 DIAGNOSIS — E1140 Type 2 diabetes mellitus with diabetic neuropathy, unspecified: Secondary | ICD-10-CM | POA: Diagnosis present

## 2016-05-03 DIAGNOSIS — Z91018 Allergy to other foods: Secondary | ICD-10-CM | POA: Diagnosis not present

## 2016-05-03 DIAGNOSIS — R55 Syncope and collapse: Secondary | ICD-10-CM | POA: Diagnosis present

## 2016-05-03 DIAGNOSIS — F17210 Nicotine dependence, cigarettes, uncomplicated: Secondary | ICD-10-CM | POA: Diagnosis present

## 2016-05-03 DIAGNOSIS — Z86010 Personal history of colonic polyps: Secondary | ICD-10-CM | POA: Diagnosis not present

## 2016-05-03 DIAGNOSIS — Z8249 Family history of ischemic heart disease and other diseases of the circulatory system: Secondary | ICD-10-CM | POA: Diagnosis not present

## 2016-05-03 DIAGNOSIS — I10 Essential (primary) hypertension: Secondary | ICD-10-CM

## 2016-05-03 DIAGNOSIS — Z825 Family history of asthma and other chronic lower respiratory diseases: Secondary | ICD-10-CM | POA: Diagnosis not present

## 2016-05-03 DIAGNOSIS — Y92009 Unspecified place in unspecified non-institutional (private) residence as the place of occurrence of the external cause: Secondary | ICD-10-CM | POA: Diagnosis not present

## 2016-05-03 DIAGNOSIS — G2581 Restless legs syndrome: Secondary | ICD-10-CM | POA: Diagnosis present

## 2016-05-03 DIAGNOSIS — Z79899 Other long term (current) drug therapy: Secondary | ICD-10-CM | POA: Diagnosis not present

## 2016-05-03 DIAGNOSIS — Z9102 Food additives allergy status: Secondary | ICD-10-CM | POA: Diagnosis not present

## 2016-05-03 DIAGNOSIS — K219 Gastro-esophageal reflux disease without esophagitis: Secondary | ICD-10-CM | POA: Diagnosis present

## 2016-05-03 LAB — GLUCOSE, CAPILLARY
GLUCOSE-CAPILLARY: 102 mg/dL — AB (ref 65–99)
GLUCOSE-CAPILLARY: 142 mg/dL — AB (ref 65–99)

## 2016-05-03 LAB — MRSA PCR SCREENING: MRSA by PCR: POSITIVE — AB

## 2016-05-03 MED ORDER — HYDRALAZINE HCL 20 MG/ML IJ SOLN: 5.0000 mg | INTRAMUSCULAR | Status: DC | PRN

## 2016-05-03 MED ORDER — MUPIROCIN 2 % EX OINT
1.0000 | TOPICAL_OINTMENT | Freq: Two times a day (BID) | CUTANEOUS | Status: DC
Start: 2016-05-03 — End: 2016-05-04
  Administered 2016-05-03: 1 via NASAL
  Filled 2016-05-03: qty 22

## 2016-05-03 MED ORDER — CHLORHEXIDINE GLUCONATE CLOTH 2 % EX PADS
6.0000 | MEDICATED_PAD | Freq: Every day | CUTANEOUS | Status: DC
  Administered 2016-05-03 – 2016-05-04 (×2): 6 via TOPICAL

## 2016-05-03 MED ORDER — CLONIDINE HCL 0.1 MG PO TABS
0.1000 mg | ORAL_TABLET | Freq: Four times a day (QID) | ORAL | Status: AC
  Administered 2016-05-03 (×2): 0.1 mg via ORAL
  Filled 2016-05-03 (×2): qty 1

## 2016-05-03 NOTE — Care Management Obs Status (Signed)
[place] NOTIFICATION   Patient Details  Name: Katrina [name] MRN: [id number] Date of Birth: January 22, 1961   Medicare Observation Status Notification Given:  Yes    Sherald Barge, RN 05/03/2016, 2:45 PM

## 2016-05-03 NOTE — Progress Notes (Signed)
PROGRESS NOTE    Katrina [name]  [id number] DOB: 01/08/1961 DOA: 05/02/2016 PCP: [facility]   Brief Narrative:  55 year old female with a history of anginal pain, anxiety, asthma/COPD, history of lung nodules, CAD, hypertension, diastolic dysfunction, chronic back pain, diverticulosis, GERD, PUD, migraine headaches, history of MRSA infection, hyperlipidemia, type 2 diabetes, peripheral neuropathy, restless leg syndrome, history of CVA/TIAs in the past, presented with a syncopal episode. Patient was admitted for a recurrent syncopes.  She had symptoms of palpitation prior to LOC.  She also has hx of severe refractory HTN.   Assessment & Plan:   Principal Problem:   Syncope Active Problems:   COPD (chronic obstructive pulmonary disease) (HCC)   GERD (gastroesophageal reflux disease)   Tobacco abuse   Diastolic dysfunction   CAD (coronary artery disease)   CVA (cerebral vascular accident) ([place])   Generalized anxiety disorder   Benign essential HTN   PTSD (post-traumatic stress disorder)   Type 2 diabetes mellitus with diabetic neuropathy (HCC)   Restless leg syndrome  1. Syncope. The patient declined MRI evaluation due to severe claustrophobia. Carotid dropplers were negative.  Will continue with monitor, giving hx of palpitation prior to LOC.  Will obtain EEG as well. 2. COPD. Continue on supplemental oxygen and bronchodilators as needed.  3. Diastolic dysfunction. Compensated at this time. Not currently on ACE inhibitor or diuretic.Cotninue  propranolol. 4. CAD. Continue clopidogrel and Inderal. 5. Hx of CVA. Continue daily Plavix. 6. HTN. Continue amlodipine and inderal.  7. Type 2 DM. Hemoglobin A1c was 5.8% on 04/04/2016. Continue gabapentin and sliding scale insulin.  8. GERD. Continue Protonix. 9. Restless leg syndrome. Continue Requip 10. Generalized anxiety disorder/PTSD. Continue  Anxiolytics as needed.  11. Tobacco abuse. Nicotine replacement therapy  offered. Counseled on cessation.  DVT prophylaxis: SQ Lovenox  Code Status: Full  Family Communication: No family bedside Disposition Plan: Discharge home once improved.    Consultants:   None   Procedures:  None   Antimicrobials:   None    Subjective: Doing well. States she passed out 2 days ago and once again before.  Objective: Vitals:   05/02/16 1819 05/02/16 1924 05/03/16 0129 05/03/16 0500  BP: (!) 219/92 (!) 194/102 (!) 152/75 (!) 166/88  Pulse: 74 63  76  Resp: 20 18  18   Temp: 98.7 F (37.1 C) 98.6 F (37 C)  98.3 F (36.8 C)  TempSrc: Oral Oral  Oral  SpO2: 99% 100%  98%  Weight:  97.8 kg (215 lb 8 oz)  98.3 kg (216 lb 11.4 oz)  Height:  5' 6.5" (1.689 m)     No intake or output data in the 24 hours ending 05/03/16 1100 Filed Weights   05/02/16 1444 05/02/16 1924 05/03/16 0500  Weight: 98.4 kg (217 lb) 97.8 kg (215 lb 8 oz) 98.3 kg (216 lb 11.4 oz)    Examination:  General exam: Appears calm and comfortable  Respiratory system: Clear to auscultation. Respiratory effort normal. Cardiovascular system: S1 & S2 heard, RRR. No JVD, murmurs, rubs, gallops or clicks. No pedal edema. Gastrointestinal system: Abdomen is nondistended, soft and nontender. No organomegaly or masses felt. Normal bowel sounds heard. Central nervous system: Alert and oriented. No focal neurological deficits. Extremities: Symmetric 5 x 5 power. Skin: No rashes, lesions or ulcers Psychiatry: Judgement and insight appear normal. Mood & affect appropriate.     Data Reviewed: I have personally reviewed following labs and imaging studies   Recent Labs Lab  05/02/16 1520  WBC 9.2  HGB 12.8  HCT 39.6  MCV 91.5  PLT 239    Recent Labs Lab 05/02/16 1520 05/02/16 2004  NA 136  --   K 3.6  --   CL 107  --   CO2 24  --   GLUCOSE 105*  --   BUN 15  --   CREATININE 0.67  --   CALCIUM 8.6*  --   MG  --  2.1    Recent Labs Lab 05/02/16 1537 05/02/16 2004  CKTOTAL 89   --   TROPONINI  --  <0.03    Recent Labs Lab 05/02/16 1824 05/02/16 2153 05/03/16 0759  GLUCAP 104* 126* 102*    Recent Results (from the past 240 hour(s))  MRSA PCR Screening     Status: Abnormal   Collection Time: 05/02/16  8:11 PM  Result Value Ref Range Status   MRSA by PCR POSITIVE (A) NEGATIVE Final    Comment:        The GeneXpert MRSA Assay (FDA approved for NASAL specimens only), is one component of a comprehensive MRSA colonization surveillance program. It is not intended to diagnose MRSA infection nor to guide or monitor treatment for MRSA infections. RESULT CALLED TO, READ BACK BY AND VERIFIED WITH: RESULT CALLED TO, READ BACK BY AND VERIFIED WITH:  GLENN,T @ 0230 ON 05/03/16 BY JUW          Radiology Studies: Ct Head Wo Contrast  Result Date: 05/02/2016 CLINICAL DATA:  Syncope, found down, right hip and leg pain. Patient describes additional history of hypertension, TIAs and strokes. Clinical data supplied on brain MRI report of 10/25/2015 indicated additional history of multiple sclerosis. EXAM: CT HEAD WITHOUT CONTRAST TECHNIQUE: Contiguous axial images were obtained from the base of the skull through the vertex without intravenous contrast. COMPARISON:  Head CTs dated 04/04/2016 and 04/03/2016. Brain MRI dated 10/25/2015. FINDINGS: Brain: Ventricles are normal in size and configuration. There is no mass, hemorrhage, edema or other evidence of acute parenchymal abnormality. No extra-axial hemorrhage. Subtle low-density foci at the interface of the subcortical white matter an overlying cortical gray matter corresponds to the nonspecific T2 hyperintensities shown on brain MRI of 10/25/2015, compatible with mild chronic microvascular ischemic change, possibly associated with the previous history suggesting multiple sclerosis. Vascular: There are chronic calcified atherosclerotic changes of the large vessels at the skull base. No unexpected hyperdense vessel.  Skull: Normal. Negative for fracture or focal lesion. Sinuses/Orbits: No acute finding. Other: None. IMPRESSION: 1. No acute findings.  No intracranial mass, hemorrhage or edema. 2. Subtle low-density foci at the peripheral margin of the subcortical white matter, corresponding to the nonspecific T2 hyperintensities shown on brain MRI of 10/25/2015, compatible with mild chronic microvascular ischemic change, possibly associated with previous history suggesting multiple sclerosis. Electronically Signed   By: Franki Cabot M.D.   On: 05/02/2016 16:13   US Carotid Bilateral  Result Date: 05/03/2016 CLINICAL DATA:  Syncope, stroke symptoms EXAM: BILATERAL CAROTID DUPLEX ULTRASOUND TECHNIQUE: Pearline Cables scale imaging, color Doppler and duplex ultrasound were performed of bilateral carotid and vertebral arteries in the neck. COMPARISON:  02/07/2015 FINDINGS: Criteria: Quantification of carotid stenosis is based on velocity parameters that correlate the residual internal carotid diameter with NASCET-based stenosis levels, using the diameter of the distal internal carotid lumen as the denominator for stenosis measurement. The following velocity measurements were obtained: RIGHT ICA:  85/30 cm/sec CCA:  99991111 cm/sec SYSTOLIC ICA/CCA RATIO:  1.0 DIASTOLIC ICA/CCA RATIO:  1.7 ECA:  157 cm/sec LEFT ICA:  104/36 cm/sec CCA:  [id number] cm/sec SYSTOLIC ICA/CCA RATIO:  1.0 DIASTOLIC ICA/CCA RATIO:  1.5 ECA:  125 cm/sec RIGHT CAROTID ARTERY: Minor echogenic shadowing plaque formation. No hemodynamically significant right ICA stenosis, velocity elevation, or turbulent flow. Degree of narrowing less than 50%. RIGHT VERTEBRAL ARTERY:  Antegrade LEFT CAROTID ARTERY: Similar scattered minor echogenic plaque formation. No hemodynamically significant left ICA stenosis, velocity elevation, or turbulent flow. LEFT VERTEBRAL ARTERY:  Antegrade IMPRESSION: Minor carotid atherosclerosis. No hemodynamically significant ICA stenosis. Degree of  narrowing less than 50% bilaterally. Patent antegrade vertebral flow bilaterally Electronically Signed   By: Jerilynn Mages.  Shick M.D.   On: 05/03/2016 09:55   Dg Hip Unilat W Or Wo Pelvis 2-3 Views Right  Result Date: 05/02/2016 CLINICAL DATA:  [place] [address] down stairs yesterday, right hip pain EXAM: DG HIP (WITH OR WITHOUT PELVIS) 2-3V RIGHT COMPARISON:  None. FINDINGS: Three views of the right hip submitted. No acute fracture or subluxation. No radiopaque foreign body. IMPRESSION: Negative. Electronically Signed   By: Lahoma Crocker M.D.   On: 05/02/2016 16:07   Dg Femur Min 2 Views Right  Result Date: 05/02/2016 CLINICAL DATA:  Fall down stairs yesterday, right hip and leg pain, initial encounter. EXAM: RIGHT FEMUR 2 VIEWS COMPARISON:  None. FINDINGS: No acute osseous abnormality.  No joint effusion in the right knee. IMPRESSION: No acute findings. Electronically Signed   By: Lorin Picket M.D.   On: 05/02/2016 16:27        Scheduled Meds: . amLODipine  10 mg Oral Daily  . baclofen  10 mg Oral TID  . Chlorhexidine Gluconate Cloth  6 each Topical Q0600  . clopidogrel  75 mg Oral Daily  . diazepam  5 mg Oral TID  . enoxaparin (LOVENOX) injection  40 mg Subcutaneous Q24H  . gabapentin  600 mg Oral TID  . insulin aspart  0-9 Units Subcutaneous TID WC  . mupirocin ointment  1 application Nasal BID  . nicotine  21 mg Transdermal Daily  . pantoprazole  80 mg Oral Daily  . propranolol ER  60 mg Oral q morning - 10a  . rOPINIRole  4 mg Oral QHS  . sodium chloride flush  3 mL Intravenous Q12H  . sodium chloride flush  3 mL Intravenous Q12H  . zolpidem  5 mg Oral QPM   Continuous Infusions:   LOS: 0 days    Time spent: 25 minutes     Orvan Falconer, MD FACP [facility] If 7PM-7AM, please contact night-coverage [email or web address] Password [email or web address] 05/03/2016, 11:00 AM   By signing my name below, I, Collene Leyden, attest that this documentation has been prepared under the direction and in the presence  of Orvan Falconer, MD. Electronically signed: Collene Leyden, Scribe. 05/03/16

## 2016-05-03 NOTE — Progress Notes (Signed)
Pt IV become occluded so I removed. Pt refused another stick. Her BP was 192/89.Marland Kitchenthe patient adv she did not feel comfortable taking Hydralazine because her BP lives around those numbers. She said if we drop her BP lower than 160s she will pass out.

## 2016-05-03 NOTE — Progress Notes (Signed)
EEG Completed; Results Pending  

## 2016-05-04 DIAGNOSIS — I519 Heart disease, unspecified: Secondary | ICD-10-CM

## 2016-05-04 LAB — GLUCOSE, CAPILLARY
Glucose-Capillary: 110 mg/dL — ABNORMAL HIGH (ref 65–99)
Glucose-Capillary: 111 mg/dL — ABNORMAL HIGH (ref 65–99)
Glucose-Capillary: 116 mg/dL — ABNORMAL HIGH (ref 65–99)

## 2016-05-04 MED ORDER — DIAZEPAM 5 MG PO TABS: 5.0000 mg | ORAL_TABLET | Freq: Three times a day (TID) | ORAL | 0 refills | Status: DC | PRN

## 2016-05-04 NOTE — Progress Notes (Signed)
Patient with orders to be discharge home. Discharge instructions given, patient verbalized understanding. Patient stable. Patient ambulated home per her request.

## 2016-05-04 NOTE — Progress Notes (Signed)
Patient with orders to be discharge home. While reviewing discharge instructions, patient complains of dizziness. BP 212/88. Dr. Marin Comment notified. Instructed to reassess in 30 minutes. Patient non-compliance with blood pressure medications.

## 2016-05-04 NOTE — Discharge Summary (Signed)
Physician Discharge Summary  Katrina [name] [id number] DOB: October 02, 1960 DOA: 05/02/2016  PCP: [facility] date: 05/02/2016 Discharge date: 05/04/2016  Admitted From: Home.  Disposition: To home.   Recommendations for Outpatient Follow-up:  1. Follow up with PCP in 1-2 weeks 2. Please obtain BMP/CBC in one week 3. Please follow up on the following pending results:  Home Health: None. Equipment/Devices: None.  Discharge Condition: Stable.  Still hypertensive.  CODE STATUS: FULL CODE>  Diet recommendation: Cardiac diet.   Brief/Interim Summary: Patient was admitted for syncopal episode by Dr Olevia Bowens on May 02, 2016.  As per his H and P:  " Katrina [name] is a 55 y.o. female with medical history significant of anginal pain, anxiety, asthma/COPD, history of lung nodules, CAD, hypertension, diastolic dysfunction, chronic back pain, diverticulosis, GERD, PUD, migraine headaches, history of MRSA infection, hyperlipidemia, type 2 diabetes, peripheral neuropathy, restless leg syndrome, history of CVA/TIAs in the past who is coming to the emergency department after losing consciousness at home yesterday.  Per patient, after she woke up yesterday morning she started feeling lightheadedness, mild nausea, and frequent palpitations. Around 1300 the patient has significant palpitations, but continue with her daily activities. She is states that sometime between 1400 and 1500, she was going to feed her daughter's dogs in the last thing she remembers was being upstairs about to go down the stairs. The next thing she remembers is waking up and down stairs at the base of the stairs with right hip and right thigh pain. She has has some dyspnea, but is unable to tell if it is related to her current symptoms, given her history of COPD. She denies chest pain, diaphoresis, lower extremity edema, PND or orthopnea.  ED Course: The patient received a normal saline bolus. Her EKG was unchanged  from previous and nonischemic. CBC was normal, urinalysis showed large hemoglobin and 30 mg of protein on chemical analysis, glucose was 105 and calcium 8.6 mg/dL. Hip and femur x-rays were negative for acute findings. Her CT of the head did not show any acute intracranial abnormalities.  Review of Systems: As per HPI otherwise 10 point review of systems negative.   HOSPITAL COURSE:  Patient was admitted into the telemetry, and her rhythm was monitored and it was unremarkable.   She had no further episodes of syncope.  She said that her SBP has been in the 200's, and she refuses to take some of her meds including Hydralazine.  She felt that if her BP is lower, she would be symptomatic.  Her anti HTN meds were continued, and since she had a recent ECHO, it was not repeated.  Her Carotid had no obstructive stenosis, and she refused MRI of her brain.  She is also anxious to go home.  She is stable for discharge, especially in light that she refuses medication for her severe HTN, and she also did not want to have MRI of her brain.  She had an EEG done, and I cautioned her against driving.  Since her history was poor at best, I have recommended that she has a 72 hours holter monitor placed, see both cardiology and neurology outpatient to further evaluate her for these episodes of loss of consciousness.  She expressed understanding and will schedule outpatient follow up for her. Thank you and Good Day.   Discharge Diagnoses:  Principal Problem:   Syncope Active Problems:   COPD (chronic obstructive pulmonary disease) (HCC)   GERD (gastroesophageal reflux disease)  Tobacco abuse   Diastolic dysfunction   CAD (coronary artery disease)   CVA (cerebral vascular accident) ([place])   Generalized anxiety disorder   Benign essential HTN   PTSD (post-traumatic stress disorder)   Type 2 diabetes mellitus with diabetic neuropathy (HCC)   Restless leg syndrome    Discharge Instructions  Discharge Instructions     Diet - low sodium heart healthy    Complete by:  As directed    Discharge instructions    Complete by:  As directed    Don't drive and avoid operating dangerous machinery.  See cardiology and neurology for further evaluation of your fainting spell. Take your medication as instructed.   Increase activity slowly    Complete by:  As directed        Medication List    STOP taking these medications   baclofen 10 MG tablet Commonly known as:  LIORESAL   EPINEPHrine 0.3 mg/0.3 mL Soaj injection Commonly known as:  EPI-PEN   zolpidem 10 MG tablet Commonly known as:  AMBIEN     TAKE these medications   albuterol 108 (90 Base) MCG/ACT inhaler Commonly known as:  PROVENTIL HFA;VENTOLIN HFA Inhale 1 puff into the lungs every 4 (four) hours as needed for wheezing.   amLODipine 10 MG tablet Commonly known as:  NORVASC Take 1 tablet (10 mg total) by mouth daily.   clopidogrel 75 MG tablet Commonly known as:  PLAVIX Take 75 mg by mouth daily.   dexlansoprazole 60 MG capsule Commonly known as:  DEXILANT Take 1 capsule (60 mg total) by mouth every evening. What changed:  when to take this   diazepam 5 MG tablet Commonly known as:  VALIUM Take 1 tablet (5 mg total) by mouth every 8 (eight) hours as needed for anxiety. What changed:  when to take this  reasons to take this   gabapentin 600 MG tablet Commonly known as:  NEURONTIN Take 1 tablet (600 mg total) by mouth 3 (three) times daily.   nicotine 14 mg/24hr patch Commonly known as:  NICODERM CQ - dosed in mg/24 hours Place 1 patch (14 mg total) onto the skin daily.   oxyCODONE-acetaminophen 7.5-325 MG tablet Commonly known as:  PERCOCET Take 1 tablet by mouth every 8 (eight) hours as needed for pain.   propranolol ER 60 MG 24 hr capsule Commonly known as:  INDERAL LA Take 1 capsule (60 mg total) by mouth at bedtime. What changed:  when to take this   rOPINIRole 4 MG tablet Commonly known as:  REQUIP Take 4 mg  by mouth at bedtime.      Follow-up Information    Kerin Ransom, [place] On 05/11/2016.   Specialties:  Cardiology, Radiology Why:  at 2:30 pm Contact information: [address] [place] [address] [phone]          Allergies  Allergen Reactions  . Penicillins Anaphylaxis    Has patient had a PCN reaction causing immediate rash, facial/tongue/throat swelling, SOB or lightheadedness with hypotension: Yes Has patient had a PCN reaction causing severe rash involving mucus membranes or skin necrosis: Yes Has patient had a PCN reaction that required hospitalization Yes Has patient had a PCN reaction occurring within the last 10 years: No If all of the above answers are "NO", then may proceed with Cephalosporin use.   . Zithromax [Azithromycin] Anaphylaxis  . Aspirin Other (See Comments)    Due to stomach ulcers.   . Pineapple Rash  . Strawberry Extract  Rash  . Aspartame And Phenylalanine Palpitations  . Mushroom Extract Complex Rash  . Nicardipine Nausea And Vomiting and Other (See Comments)    shaking  . Poultry Meal Other (See Comments)    Allergic to [place]    Consultations:  None.    Procedures/Studies: Ct Head Wo Contrast  Result Date: 05/02/2016 CLINICAL DATA:  Syncope, found down, right hip and leg pain. Patient describes additional history of hypertension, TIAs and strokes. Clinical data supplied on brain MRI report of 10/25/2015 indicated additional history of multiple sclerosis. EXAM: CT HEAD WITHOUT CONTRAST TECHNIQUE: Contiguous axial images were obtained from the base of the skull through the vertex without intravenous contrast. COMPARISON:  Head CTs dated 04/04/2016 and 04/03/2016. Brain MRI dated 10/25/2015. FINDINGS: Brain: Ventricles are normal in size and configuration. There is no mass, hemorrhage, edema or other evidence of acute parenchymal abnormality. No extra-axial hemorrhage. Subtle low-density foci at the interface of the subcortical  white matter an overlying cortical gray matter corresponds to the nonspecific T2 hyperintensities shown on brain MRI of 10/25/2015, compatible with mild chronic microvascular ischemic change, possibly associated with the previous history suggesting multiple sclerosis. Vascular: There are chronic calcified atherosclerotic changes of the large vessels at the skull base. No unexpected hyperdense vessel. Skull: Normal. Negative for fracture or focal lesion. Sinuses/Orbits: No acute finding. Other: None. IMPRESSION: 1. No acute findings.  No intracranial mass, hemorrhage or edema. 2. Subtle low-density foci at the peripheral margin of the subcortical white matter, corresponding to the nonspecific T2 hyperintensities shown on brain MRI of 10/25/2015, compatible with mild chronic microvascular ischemic change, possibly associated with previous history suggesting multiple sclerosis. Electronically Signed   By: Franki Cabot M.D.   On: 05/02/2016 16:13   US Carotid Bilateral  Result Date: 05/03/2016 CLINICAL DATA:  Syncope, stroke symptoms EXAM: BILATERAL CAROTID DUPLEX ULTRASOUND TECHNIQUE: Pearline Cables scale imaging, color Doppler and duplex ultrasound were performed of bilateral carotid and vertebral arteries in the neck. COMPARISON:  02/07/2015 FINDINGS: Criteria: Quantification of carotid stenosis is based on velocity parameters that correlate the residual internal carotid diameter with NASCET-based stenosis levels, using the diameter of the distal internal carotid lumen as the denominator for stenosis measurement. The following velocity measurements were obtained: RIGHT ICA:  85/30 cm/sec CCA:  99991111 cm/sec SYSTOLIC ICA/CCA RATIO:  1.0 DIASTOLIC ICA/CCA RATIO:  1.7 ECA:  157 cm/sec LEFT ICA:  104/36 cm/sec CCA:  [id number] cm/sec SYSTOLIC ICA/CCA RATIO:  1.0 DIASTOLIC ICA/CCA RATIO:  1.5 ECA:  125 cm/sec RIGHT CAROTID ARTERY: Minor echogenic shadowing plaque formation. No hemodynamically significant right ICA stenosis,  velocity elevation, or turbulent flow. Degree of narrowing less than 50%. RIGHT VERTEBRAL ARTERY:  Antegrade LEFT CAROTID ARTERY: Similar scattered minor echogenic plaque formation. No hemodynamically significant left ICA stenosis, velocity elevation, or turbulent flow. LEFT VERTEBRAL ARTERY:  Antegrade IMPRESSION: Minor carotid atherosclerosis. No hemodynamically significant ICA stenosis. Degree of narrowing less than 50% bilaterally. Patent antegrade vertebral flow bilaterally Electronically Signed   By: Jerilynn Mages.  Shick M.D.   On: 05/03/2016 09:55   Dg Hip Unilat W Or Wo Pelvis 2-3 Views Right  Result Date: 05/02/2016 CLINICAL DATA:  [place] [address] down stairs yesterday, right hip pain EXAM: DG HIP (WITH OR WITHOUT PELVIS) 2-3V RIGHT COMPARISON:  None. FINDINGS: Three views of the right hip submitted. No acute fracture or subluxation. No radiopaque foreign body. IMPRESSION: Negative. Electronically Signed   By: Lahoma Crocker M.D.   On: 05/02/2016 16:07   Dg Femur Min 2 Views Right  Result Date: 05/02/2016 CLINICAL DATA:  Fall down stairs yesterday, right hip and leg pain, initial encounter. EXAM: RIGHT FEMUR 2 VIEWS COMPARISON:  None. FINDINGS: No acute osseous abnormality.  No joint effusion in the right knee. IMPRESSION: No acute findings. Electronically Signed   By: Lorin Picket M.D.   On: 05/02/2016 16:27     Subjective:  Wanted to go home.    Discharge Exam: Vitals:   05/04/16 0500 05/04/16 1405  BP: (!) 167/83 (!) 212/88  Pulse: 76 (!) 55  Resp: 20 18  Temp: 98.7 F (37.1 C) (!) 96.8 F (36 C)   Vitals:   05/03/16 1930 05/04/16 0500 05/04/16 1118 05/04/16 1405  BP: (!) 192/89 (!) 167/83  (!) 212/88  Pulse: 72 76  (!) 55  Resp: 20 20  18   Temp: 98.5 F (36.9 C) 98.7 F (37.1 C)  (!) 96.8 F (36 C)  TempSrc: Oral Oral  Oral  SpO2: 98% 95% 99% 95%  Weight:  98 kg (216 lb 0.8 oz)    Height:        General: Pt is alert, awake, not in acute distress Cardiovascular: RRR, S1/S2 +, no rubs,  no gallops Respiratory: CTA bilaterally, no wheezing, no rhonchi Abdominal: Soft, NT, ND, bowel sounds + Extremities: no edema, no cyanosis    The results of significant diagnostics from this hospitalization (including imaging, microbiology, ancillary and laboratory) are listed below for reference.     Microbiology: Recent Results (from the past 240 hour(s))  MRSA PCR Screening     Status: Abnormal   Collection Time: 05/02/16  8:11 PM  Result Value Ref Range Status   MRSA by PCR POSITIVE (A) NEGATIVE Final    Comment:        The GeneXpert MRSA Assay (FDA approved for NASAL specimens only), is one component of a comprehensive MRSA colonization surveillance program. It is not intended to diagnose MRSA infection nor to guide or monitor treatment for MRSA infections. RESULT CALLED TO, READ BACK BY AND VERIFIED WITH: RESULT CALLED TO, READ BACK BY AND VERIFIED WITH:  GLENN,T @ 0230 ON 05/03/16 BY JUW      Labs: BNP (last 3 results)  Recent Labs  04/02/16 2301  BNP 0000000   Basic Metabolic Panel:  Recent Labs Lab 05/02/16 1520 05/02/16 2004  NA 136  --   K 3.6  --   CL 107  --   CO2 24  --   GLUCOSE 105*  --   BUN 15  --   CREATININE 0.67  --   CALCIUM 8.6*  --   MG  --  2.1   CBC:  Recent Labs Lab 05/02/16 1520  WBC 9.2  HGB 12.8  HCT 39.6  MCV 91.5  PLT 239   Cardiac Enzymes:  Recent Labs Lab 05/02/16 1537 05/02/16 2004  CKTOTAL 50  --   TROPONINI  --  <0.03   CBG:  Recent Labs Lab 05/03/16 0759 05/03/16 1429 05/04/16 0750 05/04/16 1211 05/04/16 1404  GLUCAP 102* 142* 110* 111* 116*   Urinalysis    Component Value Date/Time   COLORURINE YELLOW 05/02/2016 1520   APPEARANCEUR CLEAR 05/02/2016 1520   LABSPEC 1.015 05/02/2016 1520   PHURINE 6.0 05/02/2016 1520   GLUCOSEU NEGATIVE 05/02/2016 1520   HGBUR LARGE (A) 05/02/2016 1520   BILIRUBINUR NEGATIVE 05/02/2016 1520   KETONESUR NEGATIVE 05/02/2016 1520   PROTEINUR 30 (A)  05/02/2016 1520   UROBILINOGEN 0.2 01/23/2015 2255   NITRITE NEGATIVE 05/02/2016  [place] 05/02/2016 1520   Sepsis Labs Invalid input(s): PROCALCITONIN,  WBC,  LACTICIDVEN Microbiology Recent Results (from the past 240 hour(s))  MRSA PCR Screening     Status: Abnormal   Collection Time: 05/02/16  8:11 PM  Result Value Ref Range Status   MRSA by PCR POSITIVE (A) NEGATIVE Final    Comment:        The GeneXpert MRSA Assay (FDA approved for NASAL specimens only), is one component of a comprehensive MRSA colonization surveillance program. It is not intended to diagnose MRSA infection nor to guide or monitor treatment for MRSA infections. RESULT CALLED TO, READ BACK BY AND VERIFIED WITH: RESULT CALLED TO, READ BACK BY AND VERIFIED WITH:  GLENN,T @ 0230 ON 05/03/16 BY JUW      Time coordinating discharge: Over 30 minutes  SIGNED:   Orvan Falconer, MD FACP [facility] 05/04/2016, 4:42 PM   If 7PM-7AM, please contact night-coverage [email or web address] Password [email or web address]

## 2016-05-11 ENCOUNTER — Ambulatory Visit (INDEPENDENT_AMBULATORY_CARE_PROVIDER_SITE_OTHER): Payer: Medicare Other | Admitting: Cardiology

## 2016-05-11 ENCOUNTER — Encounter: Payer: Self-pay | Admitting: Cardiology

## 2016-05-11 VITALS — BP 190/100 | HR 77 | Ht 66.0 in | Wt 215.0 lb

## 2016-05-11 DIAGNOSIS — J42 Unspecified chronic bronchitis: Secondary | ICD-10-CM

## 2016-05-11 DIAGNOSIS — I10 Essential (primary) hypertension: Secondary | ICD-10-CM | POA: Diagnosis not present

## 2016-05-11 DIAGNOSIS — Z9119 Patient's noncompliance with other medical treatment and regimen: Secondary | ICD-10-CM | POA: Insufficient documentation

## 2016-05-11 DIAGNOSIS — M545 Low back pain, unspecified: Secondary | ICD-10-CM

## 2016-05-11 DIAGNOSIS — I639 Cerebral infarction, unspecified: Secondary | ICD-10-CM

## 2016-05-11 DIAGNOSIS — G8929 Other chronic pain: Secondary | ICD-10-CM

## 2016-05-11 DIAGNOSIS — M79604 Pain in right leg: Secondary | ICD-10-CM

## 2016-05-11 DIAGNOSIS — Z91199 Patient's noncompliance with other medical treatment and regimen due to unspecified reason: Secondary | ICD-10-CM | POA: Insufficient documentation

## 2016-05-11 NOTE — Assessment & Plan Note (Signed)
Pt has been on several medications for her HTN and these have been stopped for on reason or another.  She has been evaluated here and at [facility].

## 2016-05-11 NOTE — Assessment & Plan Note (Signed)
After recent fall. She is concerned there is a "blood clot". She has some swelling and calf pain.

## 2016-05-11 NOTE — Assessment & Plan Note (Signed)
There has never actually been a documented stroke despite work up here and at [facility].

## 2016-05-11 NOTE — Assessment & Plan Note (Signed)
Mid flows w/ some reversibility. DLCO diminshed at 53% No walking desats .  cxr NAD (02/2012)

## 2016-05-11 NOTE — Assessment & Plan Note (Signed)
Needs spinal injection

## 2016-05-11 NOTE — Patient Instructions (Signed)
Your physician recommends that you schedule a follow-up appointment As Needed.   Your physician recommends that you continue on your current medications as directed. Please refer to the Current Medication list given to you today.  Your physician has requested that you have a lower extremity venous duplex. This test is an ultrasound of the veins in the legs or arms. It looks at venous blood flow that carries blood from the heart to the legs or arms. Allow one hour for a Lower Venous exam. Allow thirty minutes for an Upper Venous exam. There are no restrictions or special instructions.  Your physician has recommended that you wear a holter monitor. Holter monitors are medical devices that record the heart's electrical activity. Doctors most often use these monitors to diagnose arrhythmias. Arrhythmias are problems with the speed or rhythm of the heartbeat. The monitor is a small, portable device. You can wear one while you do your normal daily activities. This is usually used to diagnose what is causing palpitations/syncope (passing out).  If you need a refill on your cardiac medications before your next appointment, please call your pharmacy.  Thank you for choosing [facility]!

## 2016-05-11 NOTE — Progress Notes (Signed)
05/11/2016 Katrina [name]   08-26-1960  [id number]  Primary Physician [facility] Primary Cardiologist: Dr Bronson Ing  HPI:  55 y/o obese Caucasian female with a history of uncontrolled hypertension, not adequately treated despite multiple evaluations because pt won't comply with medical therapy. She had been admitted with (?) stroke episodes in the past though this has been evaluated here and at [facility] and a stroke has never actually been documented. She had limited MRI in Feb 2017 but became claustrophobic. She is on Plavix for neurologic reasons, because she refuses ASA. From a cardiac standpoint she has normal coronaries by cath Aug 2016 and had an echo Oct 2017 that showed normal LVF. She was recently admitted 11/26-11/28/17 for a (?) neurologic event and was told to come and get a Holter Monitor. She tells me she fell down the stairs. Her telemetry was unremarkable. She denies palpitations.    Current Outpatient Prescriptions  Medication Sig Dispense Refill  . albuterol (PROVENTIL HFA;VENTOLIN HFA) 108 (90 Base) MCG/ACT inhaler Inhale 1 puff into the lungs every 4 (four) hours as needed for wheezing.    Marland Kitchen amLODipine (NORVASC) 10 MG tablet Take 1 tablet (10 mg total) by mouth daily. 30 tablet 6  . clopidogrel (PLAVIX) 75 MG tablet Take 75 mg by mouth daily.    Marland Kitchen dexlansoprazole (DEXILANT) 60 MG capsule Take 1 capsule (60 mg total) by mouth every evening. (Patient taking differently: Take 60 mg by mouth daily. ) 30 capsule 1  . diazepam (VALIUM) 5 MG tablet Take 1 tablet (5 mg total) by mouth every 8 (eight) hours as needed for anxiety. 30 tablet 0  . gabapentin (NEURONTIN) 600 MG tablet Take 1 tablet (600 mg total) by mouth 3 (three) times daily. (Patient taking differently: Take 600 mg by mouth 4 (four) times daily. ) 90 tablet 2  . oxyCODONE-acetaminophen (PERCOCET) 7.5-325 MG tablet Take 1 tablet by mouth every 6 (six) hours as needed.     Marland Kitchen rOPINIRole (REQUIP) 4 MG tablet  Take 4 mg by mouth at bedtime.    Marland Kitchen zolpidem (AMBIEN) 10 MG tablet Take 10 mg by mouth at bedtime as needed.      No current facility-administered medications for this visit.     Allergies  Allergen Reactions  . Penicillins Anaphylaxis    Has patient had a PCN reaction causing immediate rash, facial/tongue/throat swelling, SOB or lightheadedness with hypotension: Yes Has patient had a PCN reaction causing severe rash involving mucus membranes or skin necrosis: Yes Has patient had a PCN reaction that required hospitalization Yes Has patient had a PCN reaction occurring within the last 10 years: No If all of the above answers are "NO", then may proceed with Cephalosporin use.   . Zithromax [Azithromycin] Anaphylaxis  . Aspirin Other (See Comments)    Due to stomach ulcers.   . Pineapple Rash  . Strawberry Extract Rash  . Aspartame And Phenylalanine Palpitations  . Mushroom Extract Complex Rash  . Nicardipine Nausea And Vomiting and Other (See Comments)    shaking  . Poultry Meal Other (See Comments)    Allergic to [place]    Social History   Social History  . Marital status: Divorced    Spouse name: N/A  . Number of children: 3  . Years of education: N/A   Occupational History  .      Disability for back pain   Social History Main Topics  . Smoking status: Current Every Day Smoker  Packs/day: 2.00    Years: 44.00    Types: Cigarettes    Start date: 03/04/1971  . Smokeless tobacco: Never Used     Comment: Vapor Cigs  . Alcohol use No     Comment: 10-06-15 per pt no   . Drug use: No     Comment: 10-06-15 per pt no  . Sexual activity: Not Currently    Birth control/ protection: Surgical   Other Topics Concern  . Not on file   Social History Narrative  . No narrative on file     Review of Systems: General: negative for chills, fever, night sweats or weight changes.  Cardiovascular: negative for chest pain, dyspnea on exertion, edema, orthopnea, palpitations,  paroxysmal nocturnal dyspnea or shortness of breath Dermatological: negative for rash Respiratory: negative for cough or wheezing Urologic: negative for hematuria Abdominal: negative for nausea, vomiting, diarrhea, bright red blood per rectum, melena, or hematemesis Neurologic: negative for visual changes, syncope, or dizziness She complains of Rt leg pain and swelling after her recent fall She has chronic back pain and is apparently being set up for an injection All other systems reviewed and are otherwise negative except as noted above.    Blood pressure (!) 190/100, pulse 77, height 5\' 6"  (1.676 m), weight 215 lb (97.5 kg), SpO2 96 %.  General appearance: alert, cooperative, no distress and morbidly obese Neck: no carotid bruit and no JVD Lungs: clear to auscultation bilaterally Heart: regular rate and rhythm Extremities: some pain (calf) Rt LE to palpation Pulses: 2+ and symmetric Skin: Skin color, texture, turgor normal. No rashes or lesions Neurologic: Grossly normal   ASSESSMENT AND PLAN:   Uncontrolled hypertension B/P 182/82 by me  Non compliance with medical treatment Pt has been on several medications for her HTN and these have been stopped for on reason or another.  She has been evaluated here and at [facility].   COPD (chronic obstructive pulmonary disease) (HCC) Mid flows w/ some reversibility. DLCO diminshed at 53% No walking desats .  cxr NAD (02/2012)   Leg pain, anterior, right After recent fall. She is concerned there is a "blood clot". She has some swelling and calf pain.  Cerebrovascular accident (CVA) ([place]) There has never actually been a documented stroke despite work up here and at [facility].   Chronic bilateral low back pain without sciatica Needs spinal injection   PLAN  Check venous dopplers RLE. Check 48 hr Holter. If these are unremarkable PRN cardioogy follow up.   Kerin Ransom PA-C 05/11/2016 3:14 PM

## 2016-05-11 NOTE — Assessment & Plan Note (Signed)
B/P 182/82 by me

## 2016-05-14 ENCOUNTER — Ambulatory Visit (HOSPITAL_COMMUNITY)
Admission: RE | Admit: 2016-05-14 | Discharge: 2016-05-14 | Disposition: A | Discharge status: Home / Self Care | Payer: Medicare Other | Source: Ambulatory Visit | Attending: Cardiology | Admitting: Cardiology

## 2016-05-14 DIAGNOSIS — M79604 Pain in right leg: Secondary | ICD-10-CM | POA: Diagnosis present

## 2016-05-14 DIAGNOSIS — I639 Cerebral infarction, unspecified: Secondary | ICD-10-CM

## 2016-05-18 ENCOUNTER — Ambulatory Visit (INDEPENDENT_AMBULATORY_CARE_PROVIDER_SITE_OTHER): Payer: Medicare Other | Admitting: Neurology

## 2016-05-18 ENCOUNTER — Encounter: Payer: Self-pay | Admitting: Neurology

## 2016-05-18 VITALS — BP 223/92 | HR 78 | Ht 66.0 in | Wt 214.5 lb

## 2016-05-18 DIAGNOSIS — G8194 Hemiplegia, unspecified affecting left nondominant side: Secondary | ICD-10-CM | POA: Diagnosis not present

## 2016-05-18 DIAGNOSIS — I639 Cerebral infarction, unspecified: Secondary | ICD-10-CM | POA: Diagnosis not present

## 2016-05-18 NOTE — Progress Notes (Signed)
PATIENT: Katrina [name] DOB: Apr 27, 1961  Chief Complaint  Patient presents with  . Late effects of CVA vs MS    She was sent here for an evaluation for her left-sided weakness, black-outs, intermittent numbness/tingling throughout body and double vision.  She would like to review her abnormal CT head scan, which indicated possible MS.  States she is extremely claustrophobic and is unable to have MRI without full sedation.  She has a history of four prior strokes.  Marland Kitchen PCP    Berkley Harvey, NP     HISTORICAL  Katrina [name] is a 55 years old right-handed female, seen in refer by her primary care physician Dr. Berkley Harvey for evaluation of left-sided weakness, blacking out spells, initial evaluation was December twelfth 2017  I reviewed and summarized medical history, she had a past medical history of hypertension, anxiety, asthma, COPD, hyperlipidemia, restless leg syndrome, peripheral neuropathy, reported a history of stroke, she still smoked 2 packs a day  Patient reportedly strokelike symptoms since 2015, she presented with sudden onset left arm and leg weakness, with residual left hemiparesis, ambulate with a cane, but she demonstrated variable effort on examinations. She also reported set onset left visual field deficit In December 2016, also complains of right leg pain, numbness involving both arm and legs,  She complains bilateral frontal constant pressure headaches, I personally reviewed CAT scan of the brain without contrast in November 2017, no acute abnormality, periventricular small vessel disease.   MRI of lumbar in 2017, multilevel degenerative disc disease, no significant foraminal canal stenosis.  MRI of the brain was attempted in February 2017, significant motion movement, patient reported history of PTSD, severe claustrophobia, could not tolerate even open MRI IV Ativan.  REVIEW OF SYSTEMS: Full 14 system review of systems performed and notable only for weight gain,  fatigue, swelling legs, double vision, eye pain, shortness of breath, snoring, bloody urine, increased thirst, joint pain, allergies, headaches, numbness, weakness, dizziness, insomnia, restless leg, depression, change in appetite   ALLERGIES: Allergies  Allergen Reactions  . Penicillins Anaphylaxis    Has patient had a PCN reaction causing immediate rash, facial/tongue/throat swelling, SOB or lightheadedness with hypotension: Yes Has patient had a PCN reaction causing severe rash involving mucus membranes or skin necrosis: Yes Has patient had a PCN reaction that required hospitalization Yes Has patient had a PCN reaction occurring within the last 10 years: No If all of the above answers are "NO", then may proceed with Cephalosporin use.   . Zithromax [Azithromycin] Anaphylaxis  . Aspirin Other (See Comments)    Due to stomach ulcers.   . Pineapple Rash  . Strawberry Extract Rash  . Aspartame And Phenylalanine Palpitations  . Mushroom Extract Complex Rash  . Nicardipine Nausea And Vomiting and Other (See Comments)    shaking  . Poultry Meal Other (See Comments)    Allergic to [place]    HOME MEDICATIONS: Current Outpatient Prescriptions  Medication Sig Dispense Refill  . albuterol (PROVENTIL HFA;VENTOLIN HFA) 108 (90 Base) MCG/ACT inhaler Inhale 1 puff into the lungs every 4 (four) hours as needed for wheezing.    Marland Kitchen amLODipine (NORVASC) 10 MG tablet Take 1 tablet (10 mg total) by mouth daily. 30 tablet 6  . clopidogrel (PLAVIX) 75 MG tablet Take 75 mg by mouth daily.    Marland Kitchen dexlansoprazole (DEXILANT) 60 MG capsule Take 1 capsule (60 mg total) by mouth every evening. (Patient taking differently: Take 60 mg by mouth daily. ) 30 capsule  1  . diazepam (VALIUM) 5 MG tablet Take 1 tablet (5 mg total) by mouth every 8 (eight) hours as needed for anxiety. 30 tablet 0  . gabapentin (NEURONTIN) 600 MG tablet Take 1 tablet (600 mg total) by mouth 3 (three) times daily. (Patient taking  differently: Take 600 mg by mouth 4 (four) times daily. ) 90 tablet 2  . oxyCODONE-acetaminophen (PERCOCET) 7.5-325 MG tablet Take 1 tablet by mouth every 6 (six) hours as needed.     Marland Kitchen rOPINIRole (REQUIP) 4 MG tablet Take 4 mg by mouth at bedtime.    Marland Kitchen zolpidem (AMBIEN) 10 MG tablet Take 10 mg by mouth at bedtime as needed.      No current facility-administered medications for this visit.     PAST MEDICAL HISTORY: Past Medical History:  Diagnosis Date  . Anginal pain ([place])   . Anxiety    takes Valium daily as needed  . Asthma    has inhalers but doesn't use  . CAD (coronary artery disease)    Moderate nonobstructive 2012-2013, [place]  . Chronic back pain    budlging disc   . COPD (chronic obstructive pulmonary disease) ([place])   . Diverticulitis   . Family history of adverse reaction to anesthesia    daughter gets extremely sick   . GERD (gastroesophageal reflux disease)    takes Dexilant daily  . History of blood transfusion 1975/1976   no abnormal reaction noted  . History of bronchitis 06/2015  . History of colitis   . History of colon polyps    benign  . History of gastric ulcer   . History of migraine    last one a few days ago  . History of MRSA infection   . Hyperlipidemia    was on meds but has been off over a yr  . Hypertension    takes Amlodipine and Maxzide daily  . Insomnia    takes Ambien nightly  . Lung nodules   . Microscopic hematuria   . MS (multiple sclerosis) ([place])    questionable per pt  . Peripheral neuropathy ([facility])    weakness,numbness,and tingling. Takes Gabapentin daily  . Pneumonia 06/2015  . Restless leg syndrome    takes Requip daily  . Stroke [facility])    takes Plavix daily.Left sided weakness    PAST SURGICAL HISTORY: Past Surgical History:  Procedure Laterality Date  . ABDOMINAL HYSTERECTOMY    . ANKLE SURGERY Bilateral   . APPENDECTOMY    . BACK SURGERY     fusion  . CARDIAC CATHETERIZATION N/A 01/31/2015   Procedure: Left  Heart Cath and Coronary Angiography;  Surgeon: Burnell Blanks, MD;  Location: [facility];  Service: Cardiovascular;  Laterality: N/A;  . CHOLECYSTECTOMY    . COLONOSCOPY    . ESOPHAGOGASTRODUODENOSCOPY    . NOSE SURGERY    . RADIOLOGY WITH ANESTHESIA N/A 01/15/2016   Procedure: MRI LUMBAR SPINE WITHOUT;  Surgeon: Medication Radiologist, MD;  Location: [facility];  Service: Radiology;  Laterality: N/A;  . TONSILLECTOMY    . 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 Coronary artery disease Father   . Emphysema Father   . Heart attack Father   . Stroke Father   . Cancer Father     Unsure of type   . Depression Mother   . Bipolar disorder  Brother   . Drug abuse Brother   . Bipolar disorder Daughter   . Diabetes Maternal Grandmother     SOCIAL HISTORY:  Social History   Social History  . Marital status: Divorced    Spouse name: N/A  . Number of children: 3  . Years of education: Some college   Occupational History  .      Disability for back pain   Social History Main Topics  . Smoking status: Current Every Day Smoker    Packs/day: 2.00    Years: 44.00    Types: Cigarettes    Start date: 03/04/1971  . Smokeless tobacco: Never Used     Comment: Vapor Cigs  . Alcohol use No     Comment: 10-06-15 per pt no   . Drug use: No     Comment: 10-06-15 per pt no  . Sexual activity: Not Currently    Birth control/ protection: Surgical   Other Topics Concern  . Not on file   Social History Narrative   Lives at home with her daughter.   Right-handed.   1-2 cups coffee in the morning and 2 sodas per day.        PHYSICAL EXAM   Vitals:   05/18/16 0800  BP: (!) 223/92  Pulse: 78  Weight: 214 lb 8 oz (97.3 kg)  Height: 5\' 6"  (1.676 m)    Not recorded      Body mass index is 34.62 kg/m.  PHYSICAL EXAMNIATION:  Gen: NAD, conversant, well nourised, obese,  well groomed                     Cardiovascular: Regular rate rhythm, no peripheral edema, warm, nontender. Eyes: Conjunctivae clear without exudates or hemorrhage Neck: Supple, no carotid bruits. Pulmonary: Clear to auscultation bilaterally   NEUROLOGICAL EXAM:  MENTAL STATUS: Speech:    Speech is normal; fluent and spontaneous with normal comprehension.  Cognition:     Orientation to time, place and person     Normal recent and remote memory     Normal Attention span and concentration     Normal Language, naming, repeating,spontaneous speech     Fund of knowledge   CRANIAL NERVES: CN II: Visual fields are full to confrontation. Fundoscopic exam is normal with sharp discs and no vascular changes. Pupils are round equal and briskly reactive to light. CN III, IV, VI: extraocular movement are normal. No ptosis. CN V: Facial sensation is intact to pinprick in all 3 divisions bilaterally. Corneal responses are intact.  CN VII: Face is symmetric with normal eye closure and smile. CN VIII: Hearing is normal to rubbing fingers CN IX, X: Palate elevates symmetrically. Phonation is normal. CN XI: Head turning and shoulder shrug are intact CN XII: Tongue is midline with normal movements and no atrophy.  MOTOR: Variable effort on examinations, give away weakness on left upper and lower extremity motor examination  REFLEXES: Reflexes are 2+ and symmetric at the biceps, triceps, knees, and ankles. Plantar responses are flexor.  SENSORY: Intact to light touch, pinprick, positional sensation and vibratory sensation are intact in fingers and toes.  COORDINATION: Rapid alternating movements and fine finger movements are intact. There is no dysmetria on finger-to-nose and heel-knee-shin.    GAIT/STANCE: dragging left leg, but with variable effort  Romberg is absent.   DIAGNOSTIC DATA (LABS, IMAGING, TESTING) - I reviewed patient records, labs, notes, testing and imaging myself where  available.   ASSESSMENT AND PLAN  Katrina [name] is a 55 y.o. female   Recurrent strokelike symptoms, left hemiparesis, left visual field deficit,  Localize the lesion to right hemisphere,  Potential etiologies stroke versus multiple sclerosis  She does has multiple vascular risk factors, hypertension poorly controlled, hyperlipidemia, longtime smoker,   Continue Plavix daily   MRI of the brain under sedation,   She has variable effort on examinations   Marcial Pacas, M.D. Ph.D.  [facility] [address], [place], [place] [address] Ph: [phone] Fax: [phone]  CC: Berkley Harvey, NP

## 2016-05-20 ENCOUNTER — Telehealth: Payer: Self-pay | Admitting: *Deleted

## 2016-05-20 NOTE — Telephone Encounter (Signed)
I called Sarah back and informed her I will schedule here at [facility] I then called [facility] and had to leave a voicemail for the lady that scheduled the sedated MRI's to call me back to schedule it.

## 2016-05-20 NOTE — Telephone Encounter (Signed)
Sarah/ [facility] called requesting Raquel Sarna call her back. She stated the comment section stated patient needed IV sedation. [facility] does not do IV sedation. Please call Sarah.

## 2016-05-21 ENCOUNTER — Ambulatory Visit
Admission: RE | Admit: 2016-05-21 | Discharge: 2016-05-21 | Disposition: A | Discharge status: Home / Self Care | Payer: Medicare Other | Source: Ambulatory Visit | Attending: Nurse Practitioner | Admitting: Nurse Practitioner

## 2016-05-21 DIAGNOSIS — Z1231 Encounter for screening mammogram for malignant neoplasm of breast: Secondary | ICD-10-CM

## 2016-06-21 ENCOUNTER — Encounter: Payer: Self-pay | Admitting: Orthopaedic Surgery

## 2016-06-23 ENCOUNTER — Encounter (HOSPITAL_COMMUNITY)
Admission: RE | Admit: 2016-06-23 | Discharge: 2016-06-23 | Disposition: A | Discharge status: Home / Self Care | Payer: Medicare Other | Source: Ambulatory Visit | Attending: Neurology | Admitting: Neurology

## 2016-06-23 ENCOUNTER — Encounter (HOSPITAL_COMMUNITY): Payer: Self-pay

## 2016-06-23 DIAGNOSIS — G8929 Other chronic pain: Secondary | ICD-10-CM | POA: Diagnosis not present

## 2016-06-23 DIAGNOSIS — Z90710 Acquired absence of both cervix and uterus: Secondary | ICD-10-CM | POA: Diagnosis not present

## 2016-06-23 DIAGNOSIS — J45909 Unspecified asthma, uncomplicated: Secondary | ICD-10-CM | POA: Diagnosis not present

## 2016-06-23 DIAGNOSIS — G43909 Migraine, unspecified, not intractable, without status migrainosus: Secondary | ICD-10-CM | POA: Diagnosis not present

## 2016-06-23 DIAGNOSIS — K219 Gastro-esophageal reflux disease without esophagitis: Secondary | ICD-10-CM | POA: Diagnosis not present

## 2016-06-23 DIAGNOSIS — J449 Chronic obstructive pulmonary disease, unspecified: Secondary | ICD-10-CM | POA: Insufficient documentation

## 2016-06-23 DIAGNOSIS — I10 Essential (primary) hypertension: Secondary | ICD-10-CM | POA: Diagnosis not present

## 2016-06-23 DIAGNOSIS — M549 Dorsalgia, unspecified: Secondary | ICD-10-CM | POA: Diagnosis not present

## 2016-06-23 DIAGNOSIS — Z01812 Encounter for preprocedural laboratory examination: Secondary | ICD-10-CM | POA: Diagnosis not present

## 2016-06-23 DIAGNOSIS — E785 Hyperlipidemia, unspecified: Secondary | ICD-10-CM | POA: Diagnosis not present

## 2016-06-23 DIAGNOSIS — F419 Anxiety disorder, unspecified: Secondary | ICD-10-CM | POA: Diagnosis not present

## 2016-06-23 DIAGNOSIS — Z9889 Other specified postprocedural states: Secondary | ICD-10-CM | POA: Diagnosis not present

## 2016-06-23 DIAGNOSIS — I2510 Atherosclerotic heart disease of native coronary artery without angina pectoris: Secondary | ICD-10-CM | POA: Insufficient documentation

## 2016-06-23 DIAGNOSIS — G4700 Insomnia, unspecified: Secondary | ICD-10-CM | POA: Diagnosis not present

## 2016-06-23 DIAGNOSIS — F17200 Nicotine dependence, unspecified, uncomplicated: Secondary | ICD-10-CM | POA: Insufficient documentation

## 2016-06-23 DIAGNOSIS — R918 Other nonspecific abnormal finding of lung field: Secondary | ICD-10-CM | POA: Insufficient documentation

## 2016-06-23 DIAGNOSIS — Z8673 Personal history of transient ischemic attack (TIA), and cerebral infarction without residual deficits: Secondary | ICD-10-CM | POA: Diagnosis not present

## 2016-06-23 LAB — CBC
HEMATOCRIT: 40.7 % (ref 36.0–46.0)
HEMOGLOBIN: 13.1 g/dL (ref 12.0–15.0)
MCH: 29 pg (ref 26.0–34.0)
MCHC: 32.2 g/dL (ref 30.0–36.0)
MCV: 90.2 fL (ref 78.0–100.0)
Platelets: 247 10*3/uL (ref 150–400)
RBC: 4.51 MIL/uL (ref 3.87–5.11)
RDW: 14.6 % (ref 11.5–15.5)
WBC: 7.2 10*3/uL (ref 4.0–10.5)

## 2016-06-23 LAB — BASIC METABOLIC PANEL
Anion gap: 8 (ref 5–15)
BUN: 15 mg/dL (ref 6–20)
CHLORIDE: 108 mmol/L (ref 101–111)
CO2: 23 mmol/L (ref 22–32)
CREATININE: 0.86 mg/dL (ref 0.44–1.00)
Calcium: 8.9 mg/dL (ref 8.9–10.3)
GFR calc Af Amer: >60 mL/min (ref >60)
GFR calc non Af Amer: >60 mL/min (ref >60)
GLUCOSE: 109 mg/dL — AB (ref 65–99)
Potassium: 4.1 mmol/L (ref 3.5–5.1)
Sodium: 139 mmol/L (ref 135–145)

## 2016-06-23 NOTE — Pre-Procedure Instructions (Signed)
    Katrina [name]  06/23/2016      [facility] - [place], [place] - [address] AT [address] [place] [place] [place] [address] Phone: [phone] Fax: [phone]    Your procedure is scheduled on January 23rd, Tuesday   Report to [facility] Admitting at 6:00 AM             (procedure scheduled from 8:00 - 9:00 am)   Call this number if you have problems the MORNING of the MRI.  [phone]   Remember:  Do not eat food or drink liquids after midnight Monday.   Take these medicines the morning of surgery with A SIP OF WATER : Norvasc, Oxycodone (if needed),              Please use your inhaler that morning.   Do not wear jewelry, make-up or nail polish.  Do not wear lotions, powders, perfumes, or deoderant.     Do not bring valuables to the hospital.  [facility] is not responsible for any belongings or valuables.  Contacts, dentures or bridgework may not be worn into surgery.  Leave your suitcase in the car.  After surgery it may be brought to your room.  Patients discharged the day of surgery will not be allowed to drive home.   Please read over the following fact sheets that you were given. Pain Booklet

## 2016-06-23 NOTE — Progress Notes (Addendum)
PCP is NP [name] in [facility].  Pt is going to see her today. 06/23/2016 She states she has "constant chest discomfort", had heart cath 2016, and echo 10/17.  She states that she hasn't seen a cardio in 2 yrs, and that the results are not heart related.   Also sees L. Kilroy,--"having traces of blood in my urine" but they are unable to locate source. LOV 05/2016 States that she was a 'diabetic, years ago, came off all meds in 2011.  Last A1C was 03/2016 5.8 She's had 4 mini strokes in the past, last one left her with left sided weakness. Neurologist is Marcial Pacas, MD from [facility]. (note inside chart) At PAT, her blood pressure was 204/99, and that's after taking her Norvasc.  She stated "that's good for me", usually higher than that. She was told that the reason for MRI was ?? MS

## 2016-06-25 ENCOUNTER — Encounter (HOSPITAL_COMMUNITY): Payer: Self-pay

## 2016-06-25 LAB — HEMOGLOBIN A1C
HEMOGLOBIN A1C: 5.8 % — AB (ref 4.8–5.6)
Mean Plasma Glucose: 120 mg/dL

## 2016-06-25 NOTE — Progress Notes (Signed)
Anesthesia Chart Review: Patient is a 56 year old female scheduled for MRI brain without contrast on 06/29/16. MRI was ordered by her neurologist Dr. Marcial Pacas. Last office visit 05/18/16. She has a PCP office visit on 06/09/16 by [name], FNP and again on 06/25/16 ([place]; see Care Everywhere). MRI is to evaluated for possible multiple sclerosis or CVA, and patient has severe claustrophobia with MRIs.   History includes smoking, CAD, chest pain (chronic) with normal coronaries by 01/31/15 and non-ischemic stress test 08/14/15, HTN, lung nodules, GERD, insomnia, HLD, COPD, asthma, migraines, RLS, peripheral neuropathy, chronic back pain, PUD, colitis, anxiety, MRSA, microscopic hematuria, CVA with left sided weakness (on Plavix), appendectomy, hysterectomy, tonsillectomy, nasal surgery, back surgery. BMI is consistent with obesity.   Cardiologist is Dr. Bronson Ing, last visit with Kerin Ransom, PA-C on 05/11/16. His note indicates issues with medication compliance for HTN. She had some RLE swelling so RLE venous duplex was ordered. He also ordered a 48 hour Holter monitor as part of an evaluation for syncope with hospitalization 05/02/16-05/04/16.  Doppler (05/14/16) was negative for RLE DVT and Holter showed no irregular heart rhythm so PRN cardiology follow-up was recommended. (She also saw cardiologist Dr. Laqueta Linden with [facility] on 01/16/16.)  She was recently evaluated at [facility] due to her HTN, records requested.   Meds currently listed include albuterol, amlodipine 10 mg daily, Excedrin Migraine, Plavix, Dexilant, Valium, Neurontin, losartan 50 mg daily, Robaxin, Percocet, Requip, Ambien. (06/09/16 PCP notes indicate that she is also on propranolol 60 mg daily and chlorthalidone 25 mg daily, but this is not listed on our medication list. I called patient, she said that she had started losartan, but had been told not to take before MRI--I told  her to hold on the morning of her MRI. She said that she had not been on propranolol for some time now, but that BP meds were adjusted today by [name]. Notes indicate that chlorthalidone was increased to 25 mg po BID,  Losartan increased to 50 mg po BID, and Catapres-TTS 0.3mg /24 hr discontinued--although this was not on our list either.  Ht 5\' 6"  (1.676 m)   Wt 216 lb 0.8 oz (98 kg)   BMI 34.87 kg/m  Vitals were not entered at her PAT visit, although her PAT note wrote that BP was 204/99 after taking Norvasc. Her BP on 06/09/16 at her PCP office was 203/104, 226/110 on 06/25/16, 200/110 07/15/15. HR 90 on 06/25/16.  EKG 05/02/16: NSR, probable left atrial enlargement. 06/09/16 EKG (PCP; Result Narrative only)  05/2016 48 hour Holter monitor:  Patient is in sinus rhythm throughout study  Rare supraventricular ectopy, rare ventricular ectopy  Min HR 58, Max HR 107, Avg HR 78  No symptoms reported  No significant arrhythmias  Echo 04/05/16: Study Conclusions - Left ventricle: The cavity size was normal. Wall thickness was   normal. Systolic function was vigorous. The estimated ejection   fraction was in the range of 65% to 70%. Wall motion was normal;   there were no regional wall motion abnormalities. Doppler   parameters are consistent with abnormal left ventricular   relaxation (grade 1 diastolic dysfunction).  Nuclear stress test 08/15/15 ([place]; Care Everywhere): IMPRESSION: 1. No reversible ischemia or infarction. 2. Normal left ventricular wall motion. 3. Left ventricular ejection fraction is 76%. 4. Low-risk stress test findings*.  Cardiac cath 01/31/15:  The left ventricular systolic function is normal.  1. No angiographic evidence of CAD  2. Normal LV systolic function 3. Uncontrolled HTN Recommendations: No further ischemic workup.   Carotid U/S 05/03/16: IMPRESSION: Minor carotid atherosclerosis. No hemodynamically significant ICA stenosis. Degree of narrowing  less than 50% bilaterally. Patent antegrade vertebral flow bilaterally  CT head 05/02/16: IMPRESSION: 1. No acute findings.  No intracranial mass, hemorrhage or edema. 2. Subtle low-density foci at the peripheral margin of the subcortical white matter, corresponding to the nonspecific T2 hyperintensities shown on brain MRI of 10/25/2015, compatible with mild chronic microvascular ischemic change, possibly associated with previous history suggesting multiple sclerosis.  Pre-procedure labs noted. Cr 0.86. Glucose 109. CBC WNL. A1c 5.8. D-dimer normal at 0.33 and troponin I < 0.300 on 06/09/16. CMET WNL 06/09/16.   I discussed patient's poorly controlled BP with anesthesiologist Dr. Lissa Hoard. I notified patient that with poorly controlled BP her procedure had a high risk of getting canceled. She was adamant that her blood pressure has been poorly controlled for at least 10 years, and her MRI was not canceled in August when her BP was "240." I told her that it would be up to the anesthesiologist discretion, but that I still felt that she was at high risk of being canceled or at least delayed if her BP stayed with her current trends, 200-220's/100-110's. (In review of her vitals for her MRI in August, BP was recorded as 198/83 at 8:14 AM and 155/73 at 12:08 PM. I didn't see that any PRN BP meds were given. Her anesthesiologist did note, "Patient lives with MAP ~120 and has had 2 previous strokes. Will plan to keep MAPs >=95 to maintain adequate cerebral perfusion pressure." Hopefully new medication adjustments will help with BP control. She will get vitals on arrival. Definitive plan on the day of her MRI.  George Hugh [facility] Short Stay Center/Anesthesiology Phone [phone] 06/25/2016 5:26 PM

## 2016-06-28 NOTE — Anesthesia Preprocedure Evaluation (Addendum)
Anesthesia Evaluation  Patient identified by MRN, date of birth, ID band Patient awake    Reviewed: Allergy & Precautions, NPO status , Patient's Chart, lab work & pertinent test results  Airway Mallampati: II  TM Distance: >3 FB     Dental   Pulmonary shortness of breath, asthma , pneumonia, COPD, Current Smoker,    breath sounds clear to auscultation       Cardiovascular hypertension, + angina + CAD   Rhythm:Regular Rate:Normal     Neuro/Psych  Headaches, TIA Neuromuscular disease    GI/Hepatic Neg liver ROS, PUD, GERD  ,  Endo/Other  diabetes  Renal/GU negative Renal ROS     Musculoskeletal   Abdominal   Peds  Hematology   Anesthesia Other Findings   Reproductive/Obstetrics                            Anesthesia Physical Anesthesia Plan  ASA: III  Anesthesia Plan: General   Post-op Pain Management:    Induction: Intravenous  Airway Management Planned: LMA  Additional Equipment:   Intra-op Plan:   Post-operative Plan: Extubation in OR  Informed Consent: I have reviewed the patients History and Physical, chart, labs and discussed the procedure including the risks, benefits and alternatives for the proposed anesthesia with the patient or authorized representative who has indicated his/her understanding and acceptance.   Dental advisory given  Plan Discussed with: CRNA and Anesthesiologist  Anesthesia Plan Comments:        Anesthesia Quick Evaluation

## 2016-06-29 ENCOUNTER — Ambulatory Visit (HOSPITAL_COMMUNITY): Payer: Medicare Other | Admitting: Vascular Surgery

## 2016-06-29 ENCOUNTER — Encounter (HOSPITAL_COMMUNITY): Payer: Self-pay | Admitting: *Deleted

## 2016-06-29 ENCOUNTER — Ambulatory Visit (HOSPITAL_COMMUNITY)
Admission: RE | Admit: 2016-06-29 | Discharge: 2016-06-29 | Disposition: A | Discharge status: Home / Self Care | Payer: Medicare Other | Source: Ambulatory Visit | Attending: Neurology | Admitting: Neurology

## 2016-06-29 ENCOUNTER — Encounter (HOSPITAL_COMMUNITY)
Admission: RE | Disposition: A | Discharge status: Home / Self Care | Payer: Self-pay | Source: Ambulatory Visit | Attending: Neurology

## 2016-06-29 ENCOUNTER — Ambulatory Visit (HOSPITAL_COMMUNITY): Payer: Medicare Other | Admitting: Anesthesiology

## 2016-06-29 DIAGNOSIS — F17200 Nicotine dependence, unspecified, uncomplicated: Secondary | ICD-10-CM | POA: Diagnosis not present

## 2016-06-29 DIAGNOSIS — Z880 Allergy status to penicillin: Secondary | ICD-10-CM | POA: Insufficient documentation

## 2016-06-29 DIAGNOSIS — I25119 Atherosclerotic heart disease of native coronary artery with unspecified angina pectoris: Secondary | ICD-10-CM | POA: Diagnosis not present

## 2016-06-29 DIAGNOSIS — Z91018 Allergy to other foods: Secondary | ICD-10-CM | POA: Diagnosis not present

## 2016-06-29 DIAGNOSIS — Z9102 Food additives allergy status: Secondary | ICD-10-CM | POA: Diagnosis not present

## 2016-06-29 DIAGNOSIS — E785 Hyperlipidemia, unspecified: Secondary | ICD-10-CM | POA: Insufficient documentation

## 2016-06-29 DIAGNOSIS — I69354 Hemiplegia and hemiparesis following cerebral infarction affecting left non-dominant side: Secondary | ICD-10-CM | POA: Diagnosis not present

## 2016-06-29 DIAGNOSIS — E119 Type 2 diabetes mellitus without complications: Secondary | ICD-10-CM | POA: Insufficient documentation

## 2016-06-29 DIAGNOSIS — I10 Essential (primary) hypertension: Secondary | ICD-10-CM | POA: Diagnosis not present

## 2016-06-29 DIAGNOSIS — Z8673 Personal history of transient ischemic attack (TIA), and cerebral infarction without residual deficits: Secondary | ICD-10-CM | POA: Diagnosis present

## 2016-06-29 DIAGNOSIS — J449 Chronic obstructive pulmonary disease, unspecified: Secondary | ICD-10-CM | POA: Diagnosis not present

## 2016-06-29 DIAGNOSIS — G8194 Hemiplegia, unspecified affecting left nondominant side: Secondary | ICD-10-CM

## 2016-06-29 DIAGNOSIS — I639 Cerebral infarction, unspecified: Secondary | ICD-10-CM

## 2016-06-29 HISTORY — PX: RADIOLOGY WITH ANESTHESIA: SHX6223

## 2016-06-29 SURGERY — RADIOLOGY WITH ANESTHESIA
Anesthesia: General

## 2016-06-29 MED ORDER — LACTATED RINGERS IV SOLN
INTRAVENOUS | Status: DC
  Administered 2016-06-29: 08:00:00 via INTRAVENOUS

## 2016-06-29 MED ORDER — PROMETHAZINE HCL 25 MG/ML IJ SOLN: 6.2500 mg | INTRAMUSCULAR | Status: DC | PRN

## 2016-06-29 NOTE — Transfer of Care (Signed)
Immediate Anesthesia Transfer of Care Note  Patient: Katrina [name]  Procedure(s) Performed: Procedure(s): MRI OF THE BRAIN WITH AND WITHOUT (N/A)  Patient Location: PACU  Anesthesia Type:General  Level of Consciousness: awake, alert , oriented and patient cooperative  Airway & Oxygen Therapy: Patient Spontanous Breathing and Patient connected to nasal cannula oxygen  Post-op Assessment: Report given to RN and Post -op Vital signs reviewed and stable  Post vital signs: Reviewed  Last Vitals: 168/100, 86,16, 97% Vitals:   06/29/16 0631 06/29/16 0913  BP: (!) 201/93   Pulse: 81   Resp: 20   Temp: 36.6 C 36.5 C    Last Pain:  Vitals:   06/29/16 0631  TempSrc: Oral      Patients Stated Pain Goal: 4 ([id number])  Complications: No apparent anesthesia complications

## 2016-06-29 NOTE — Anesthesia Postprocedure Evaluation (Signed)
Anesthesia Post Note  Patient: Katrina [name]  Procedure(s) Performed: Procedure(s) (LRB): MRI OF THE BRAIN WITH AND WITHOUT (N/A)  Patient location during evaluation: PACU Anesthesia Type: General Level of consciousness: awake Pain management: pain level controlled Vital Signs Assessment: post-procedure vital signs reviewed and stable Respiratory status: spontaneous breathing Cardiovascular status: stable Anesthetic complications: no       Last Vitals:  Vitals:   06/29/16 0631 06/29/16 0913  BP: (!) 201/93   Pulse: 81   Resp: 20   Temp: 36.6 C 36.5 C    Last Pain:  Vitals:   06/29/16 0631  TempSrc: Oral                 [name]

## 2016-06-30 ENCOUNTER — Encounter (HOSPITAL_COMMUNITY): Payer: Self-pay | Admitting: Radiology

## 2016-07-13 ENCOUNTER — Other Ambulatory Visit: Payer: Self-pay

## 2016-07-13 NOTE — Patient Outreach (Signed)
Telephone outreach to patient to obtain mRS was successfully completed. mRS = 2  Katrina [name], B.A.  [facility] Care Management Assistant   

## 2016-07-13 NOTE — Patient Outreach (Signed)
First telephone outreach attempt to patient to obtain mRS. No answer, left message for return call.  Jacqulynn Cadet  [facility] Care Management Assistant

## 2016-07-15 ENCOUNTER — Other Ambulatory Visit: Payer: Self-pay

## 2016-07-19 IMAGING — US US EXTREM LOW VENOUS*R*
1 series · 13 of 24 positions shown · non-contrast
Comparison: None.

CLINICAL DATA: Right calf pain for 1 day



[Series 1: us extrem low venous*right* · 0.08mm/px · 13 of 33 slices shown]
[im 1/33]
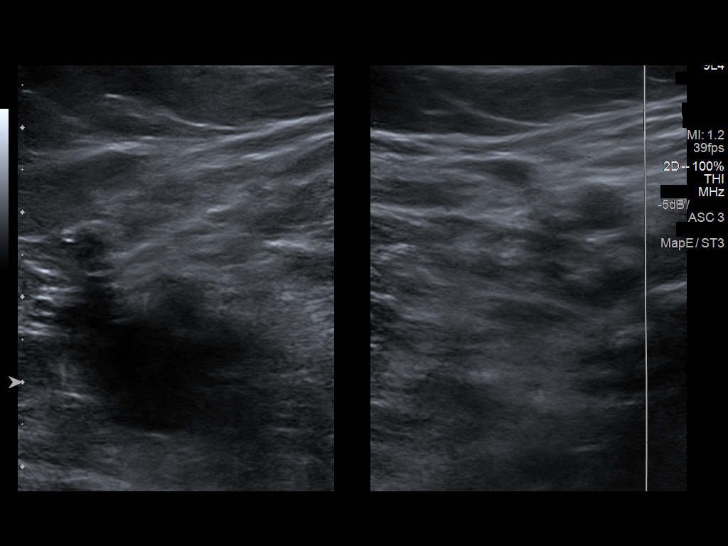
[im 3/33]
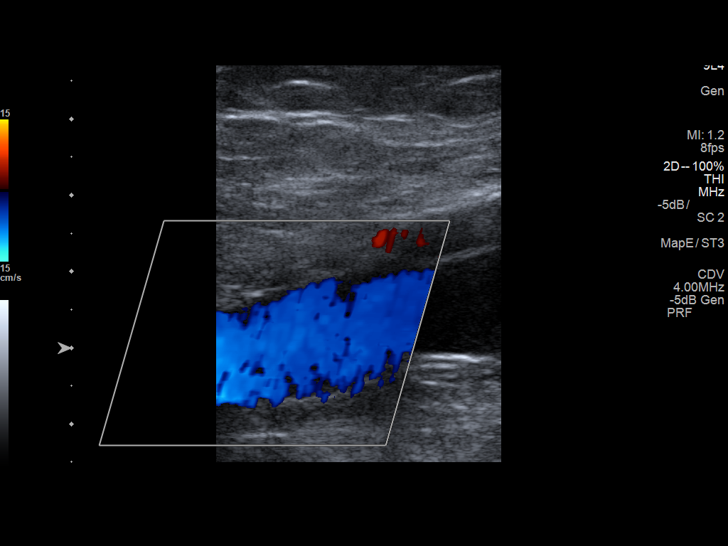
[im 6/33]
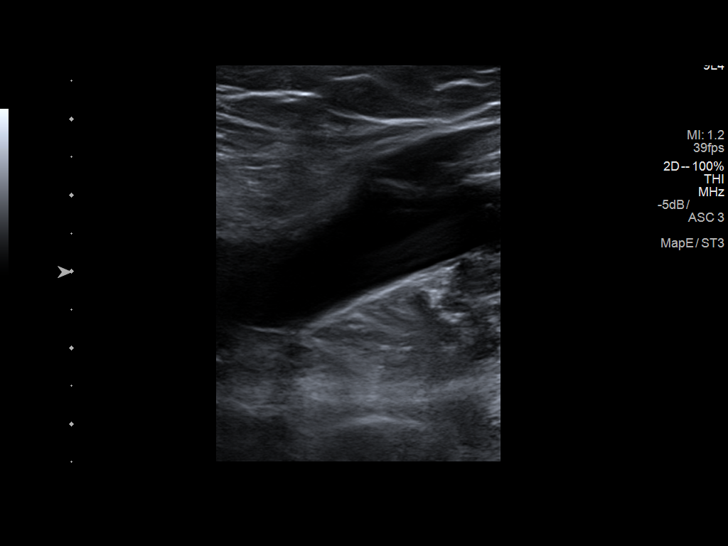
[im 9/33]
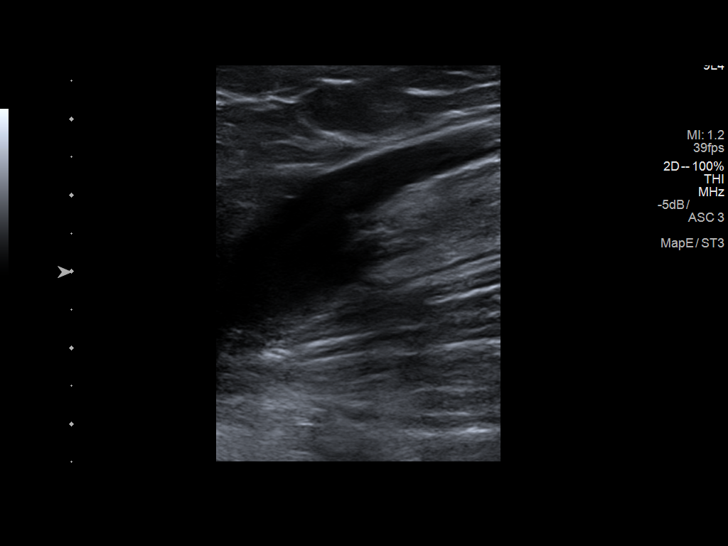
[im 12/33]
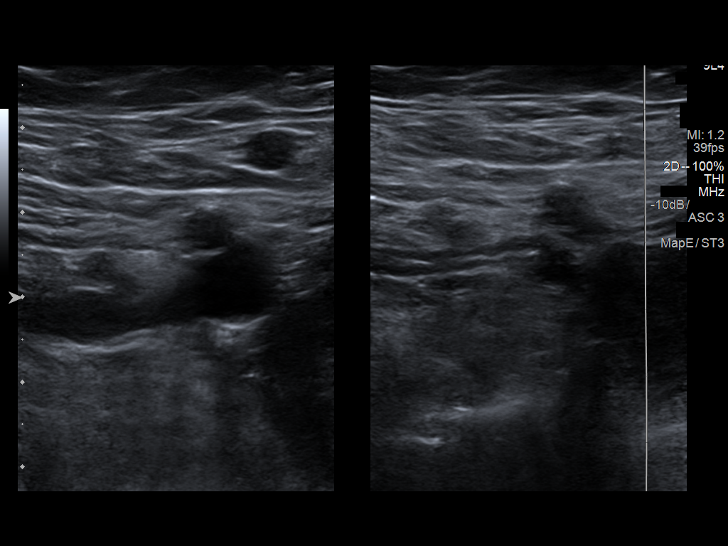
[im 14/33]
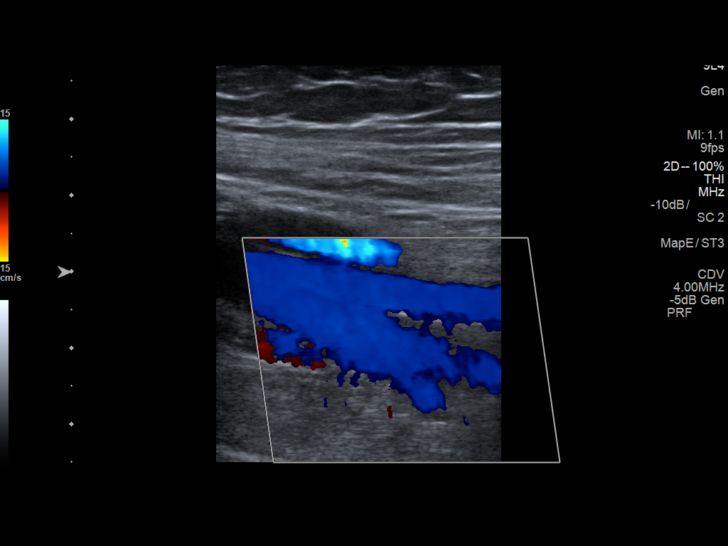
[im 17/33]
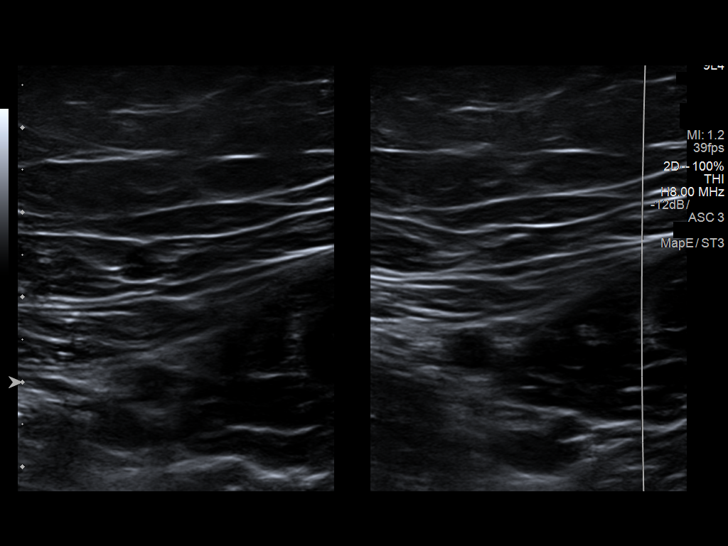
[im 19/33]
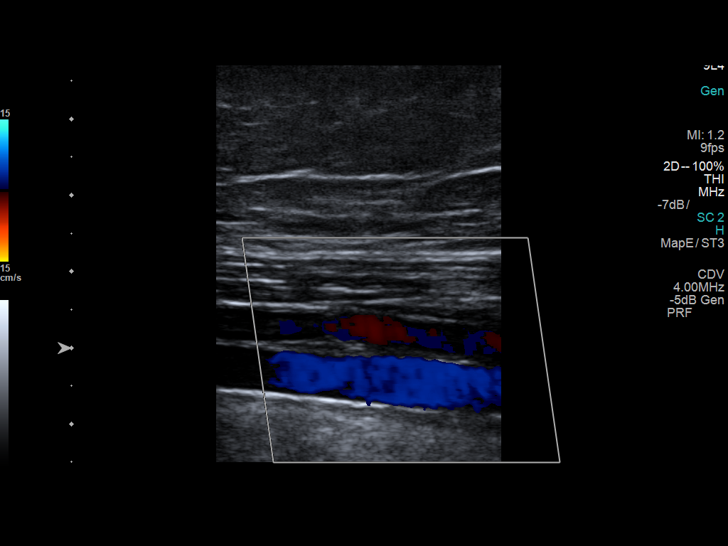
[im 21/33]
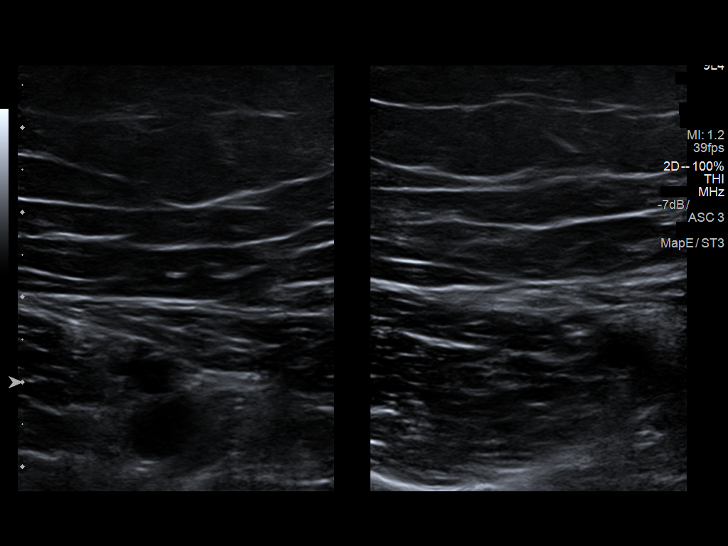
[im 24/33]
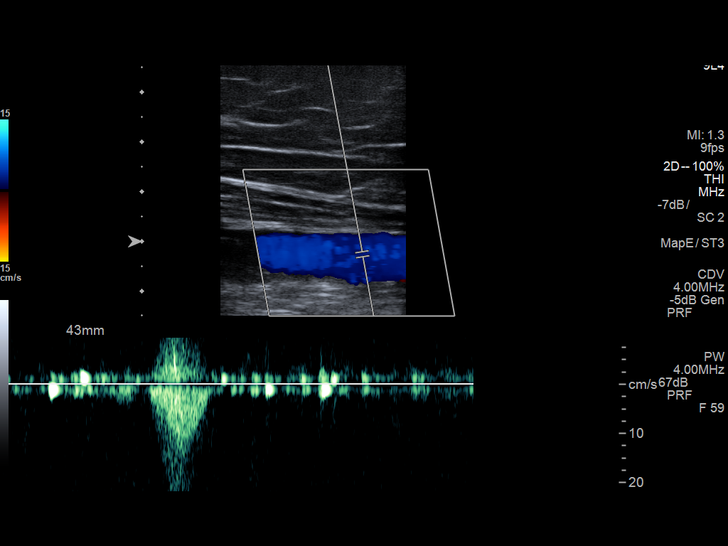
[im 27/33]
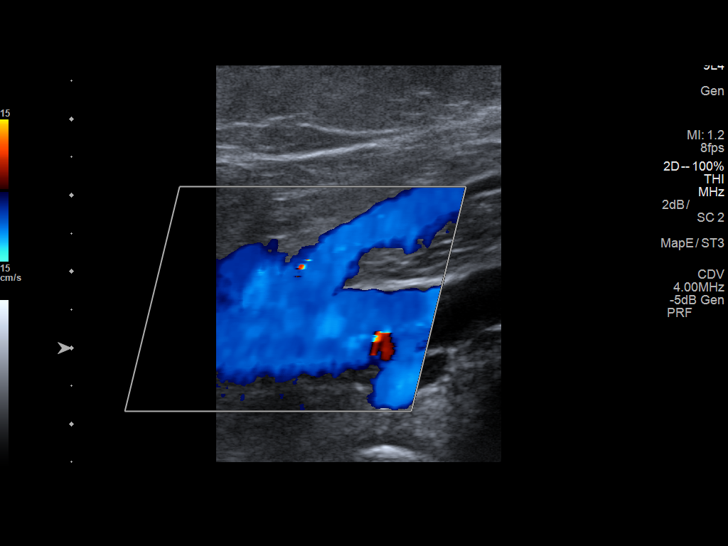
[im 30/33]
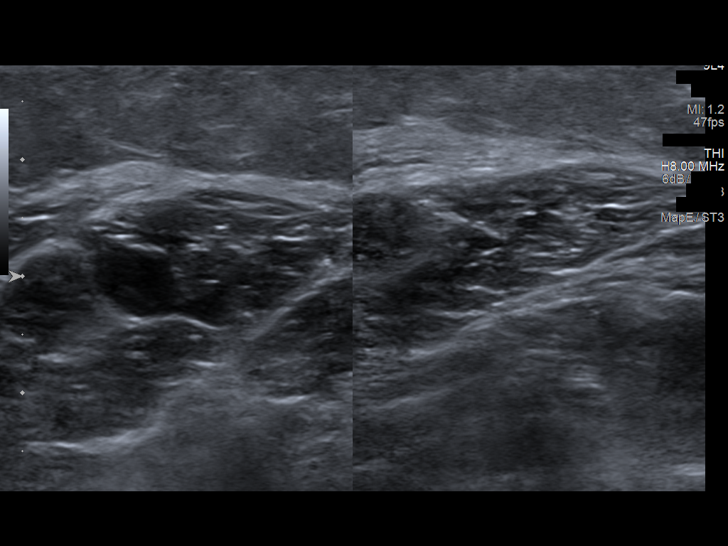
[im 33/33]
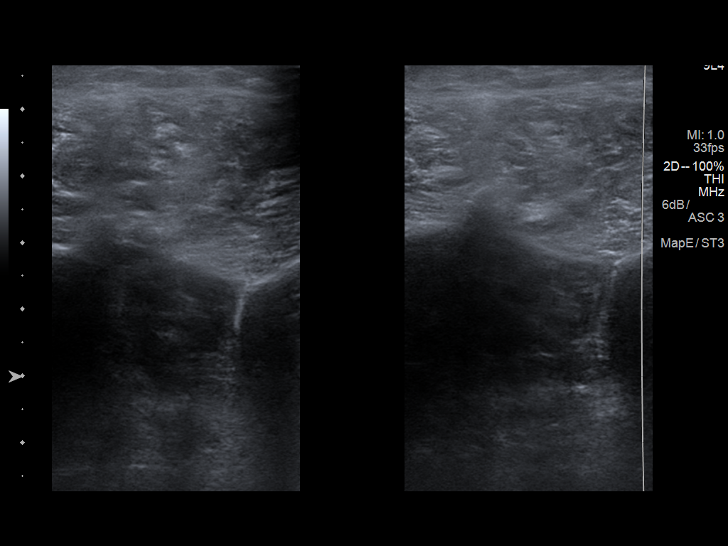

[13 of 24 positions shown; findings below may reference images not displayed]

FINDINGS: Contralateral Common Femoral Vein: Respiratory phasicity is normal
and symmetric with the symptomatic side. No evidence of thrombus.
Normal compressibility.

Common Femoral Vein: No evidence of thrombus. Normal
compressibility, respiratory phasicity and response to augmentation.

Saphenofemoral Junction: No evidence of thrombus. Normal
compressibility and flow on color Doppler imaging.

Profunda Femoral Vein: No evidence of thrombus. Normal
compressibility and flow on color Doppler imaging.

Femoral Vein: No evidence of thrombus. Normal compressibility,
respiratory phasicity and response to augmentation.

Popliteal Vein: No evidence of thrombus. Normal compressibility,
respiratory phasicity and response to augmentation.

Calf Veins: No evidence of thrombus. Normal compressibility and flow
on color Doppler imaging.

Superficial Great Saphenous Vein: No evidence of thrombus. Normal
compressibility and flow on color Doppler imaging.

Venous Reflux:  None.

Other Findings:  None.
IMPRESSION: No evidence of deep venous thrombosis.

## 2016-07-22 ENCOUNTER — Inpatient Hospital Stay
Admission: RE | Admit: 2016-07-22 | Discharge: 2016-07-22 | Disposition: A | Discharge status: Home / Self Care | Payer: Self-pay | Source: Ambulatory Visit | Attending: Neurology | Admitting: Neurology

## 2016-07-28 ENCOUNTER — Other Ambulatory Visit (HOSPITAL_COMMUNITY): Payer: Self-pay | Admitting: Nurse Practitioner

## 2016-07-28 DIAGNOSIS — R222 Localized swelling, mass and lump, trunk: Secondary | ICD-10-CM

## 2016-07-29 ENCOUNTER — Ambulatory Visit: Payer: Self-pay | Admitting: Neurology

## 2016-08-03 ENCOUNTER — Ambulatory Visit (HOSPITAL_COMMUNITY)
Admission: RE | Admit: 2016-08-03 | Discharge: 2016-08-03 | Disposition: A | Discharge status: Home / Self Care | Payer: Medicare Other | Source: Ambulatory Visit | Attending: Nurse Practitioner | Admitting: Nurse Practitioner

## 2016-08-03 DIAGNOSIS — R222 Localized swelling, mass and lump, trunk: Secondary | ICD-10-CM | POA: Diagnosis present

## 2016-08-03 DIAGNOSIS — R918 Other nonspecific abnormal finding of lung field: Secondary | ICD-10-CM | POA: Insufficient documentation

## 2016-08-03 DIAGNOSIS — R911 Solitary pulmonary nodule: Secondary | ICD-10-CM | POA: Diagnosis present

## 2016-08-16 ENCOUNTER — Telehealth: Payer: Self-pay | Admitting: *Deleted

## 2016-08-16 NOTE — Telephone Encounter (Signed)
Spoke to patient's dgt on HIPAA - follow up has been rescheduled.

## 2016-08-17 ENCOUNTER — Ambulatory Visit: Payer: Self-pay | Admitting: Neurology

## 2016-08-19 ENCOUNTER — Encounter: Payer: Self-pay | Admitting: Neurology

## 2016-08-19 ENCOUNTER — Ambulatory Visit (INDEPENDENT_AMBULATORY_CARE_PROVIDER_SITE_OTHER): Payer: Medicare Other | Admitting: Neurology

## 2016-08-19 VITALS — BP 213/105 | HR 79 | Ht 66.0 in | Wt 207.5 lb

## 2016-08-19 DIAGNOSIS — I6789 Other cerebrovascular disease: Secondary | ICD-10-CM

## 2016-08-19 DIAGNOSIS — I679 Cerebrovascular disease, unspecified: Secondary | ICD-10-CM | POA: Diagnosis not present

## 2016-08-19 DIAGNOSIS — I10 Essential (primary) hypertension: Secondary | ICD-10-CM

## 2016-08-19 NOTE — Progress Notes (Signed)
PATIENT: Katrina [name] DOB: 09-28-1960  Chief Complaint  Patient presents with  . Cerebrovascular Accident    She is here to discuss her MRI results.  She has continued Plavix daily.     HISTORICAL  Katrina [name] is a 56 years old right-handed female, seen in refer by her primary care physician Dr. Berkley Harvey for evaluation of left-sided weakness, blacking out spells, initial evaluation was December 12th 2017  I reviewed and summarized medical history, she had a past medical history of hypertension, anxiety, asthma, COPD, hyperlipidemia, restless leg syndrome, peripheral neuropathy, reported a history of stroke, she still smoked 2 packs a day  Patient reportedly strokelike symptoms since 2015, she presented with sudden onset left arm and leg weakness, with residual left hemiparesis, ambulate with a cane, but she demonstrated variable effort on examinations. She also reported acute onset left visual field deficit In December 2016, also complains of right leg pain, numbness involving both arm and legs,  She complains bilateral frontal constant pressure headaches, I personally reviewed CAT scan of the brain without contrast in November 2017, no acute abnormality, periventricular small vessel disease.   MRI of lumbar in 2017, multilevel degenerative disc disease, no significant foraminal canal stenosis.  MRI of the brain was attempted in February 2017, significant motion movement, patient reported history of PTSD, severe claustrophobia, could not tolerate even open MRI IV Ativan.  UPDATE August 19 2016: Her blood pressure is elevated 213/105 today, she is taking amlodipine 10 mg daily, Cozaar 50 mg daily, hydrochlorothiazide 25 mg daily,  She complained generalized fatigue, bilateral finger and feet paresthesia, shaking spells,  I have personally reviewed MRI of the brain January 2018, compared to previous MRIs, evidence of supratentorium small vessel disease, no evidence of acute  abnormality, stable since 2015, the patterns are not typical for multiple sclerosis.  Ultrasound of carotid artery in November 2017, mild carotid atherosclerosis, less than 50% narrowing bilaterally, bilateral vertebral artery flow were patent anterograde.   48 hours Holter monitoring in December 2017 showed sinus rhythm, no significant arrhythmia.  Ultrasound of bilateral lower extremity December 2017 was negative for deep venous thrombosis.  Echocardiogram in October 2017, The size was normal, ejection fraction 65-70%,  I was able to review [facility] record, UA on August 17 2016 showed elevated protein, moderate blood,   REVIEW OF SYSTEMS: Full 14 system review of systems performed and notable only for fatigue, eye discharge, double vision, food allergy, dizziness, headaches, depression   ALLERGIES: Allergies  Allergen Reactions  . Penicillins Anaphylaxis    Has patient had a PCN reaction causing immediate rash, facial/tongue/throat swelling, SOB or lightheadedness with hypotension: Yes Has patient had a PCN reaction causing severe rash involving mucus membranes or skin necrosis: Yes Has patient had a PCN reaction that required hospitalization Yes Has patient had a PCN reaction occurring within the last 10 years: No If all of the above answers are "NO", then may proceed with Cephalosporin use.   . Zithromax [Azithromycin] Anaphylaxis  . Aspirin Other (See Comments)    Due to stomach ulcers.   . Pineapple Rash  . Strawberry Extract Rash  . Aspartame And Phenylalanine Palpitations  . Eggs Or Egg-Derived Products Other (See Comments)    Allergic to [place] UNSPECIFIED REACTION   . Mushroom Extract Complex Rash  . Nicardipine Nausea And Vomiting and Other (See Comments)    shaking  . Poultry Meal Other (See Comments)    Allergic to [place] UNSPECIFIED REACTION  HOME MEDICATIONS: Current Outpatient Prescriptions  Medication Sig Dispense Refill  . albuterol (PROVENTIL  HFA;VENTOLIN HFA) 108 (90 Base) MCG/ACT inhaler Inhale 1 puff into the lungs every 4 (four) hours as needed for wheezing.    Marland Kitchen amLODipine (NORVASC) 10 MG tablet Take 1 tablet (10 mg total) by mouth daily. 30 tablet 6  . aspirin-acetaminophen-caffeine (EXCEDRIN MIGRAINE) 250-250-65 MG tablet Take 2 tablets by mouth every 6 (six) hours as needed for migraine.    . clopidogrel (PLAVIX) 75 MG tablet Take 75 mg by mouth daily.    Marland Kitchen dexlansoprazole (DEXILANT) 60 MG capsule Take 1 capsule (60 mg total) by mouth every evening. (Patient taking differently: Take 60 mg by mouth daily. ) 30 capsule 1  . diazepam (VALIUM) 5 MG tablet Take 1 tablet (5 mg total) by mouth every 8 (eight) hours as needed for anxiety. (Patient taking differently: Take 5 mg by mouth daily as needed for anxiety. ) 30 tablet 0  . gabapentin (NEURONTIN) 600 MG tablet Take 1 tablet (600 mg total) by mouth 3 (three) times daily. (Patient taking differently: Take 600 mg by mouth 4 (four) times daily. ) 90 tablet 2  . losartan (COZAAR) 50 MG tablet Take 50 mg by mouth daily.  3  . methocarbamol (ROBAXIN) 750 MG tablet Take 750 mg by mouth 2 (two) times daily as needed for muscle spasms.  1  . oxyCODONE-acetaminophen (PERCOCET) 7.5-325 MG tablet Take 1 tablet by mouth every 6 (six) hours as needed (for pain.).     Marland Kitchen rOPINIRole (REQUIP) 4 MG tablet Take 4 mg by mouth at bedtime.    Marland Kitchen zolpidem (AMBIEN) 10 MG tablet Take 10 mg by mouth at bedtime as needed for sleep.      No current facility-administered medications for this visit.     PAST MEDICAL HISTORY: Past Medical History:  Diagnosis Date  . Anginal pain ([place])   . Anxiety    takes Valium daily as needed  . Asthma    has inhalers but doesn't use  . CAD (coronary artery disease)    Moderate nonobstructive 2012-2013, [place]; No angiographic evidence of CAD 01/31/15 [facility]  . Chronic back pain    budlging disc   . COPD (chronic obstructive pulmonary disease) ([place])   . Diverticulitis    . Family history of adverse reaction to anesthesia    daughter gets extremely sick   . GERD (gastroesophageal reflux disease)    takes Dexilant daily  . History of blood transfusion 1975/1976   no abnormal reaction noted  . History of bronchitis 06/2015  . History of colitis   . History of colon polyps    benign  . History of gastric ulcer   . History of migraine    last one a few days ago  . History of MRSA infection   . Hyperlipidemia    was on meds but has been off over a yr  . Hypertension    takes Amlodipine and Maxzide daily  . Insomnia    takes Ambien nightly  . Lung nodules   . Microscopic hematuria   . MS (multiple sclerosis) ([place])    questionable per pt  . Peripheral neuropathy ([facility])    weakness,numbness,and tingling. Takes Gabapentin daily  . Pneumonia 06/2015  . Restless leg syndrome    takes Requip daily  . Stroke [facility])    takes Plavix daily.Left sided weakness    PAST SURGICAL HISTORY: Past Surgical History:  Procedure Laterality Date  . ABDOMINAL HYSTERECTOMY    .  ANKLE SURGERY Bilateral   . APPENDECTOMY    . BACK SURGERY     fusion  . CARDIAC CATHETERIZATION N/A 01/31/2015   Procedure: Left Heart Cath and Coronary Angiography;  Surgeon: Burnell Blanks, MD;  Location: [place] [facility];  Service: Cardiovascular;  Laterality: N/A;  . CHOLECYSTECTOMY    . COLONOSCOPY    . ESOPHAGOGASTRODUODENOSCOPY    . NOSE SURGERY    . RADIOLOGY WITH ANESTHESIA N/A 01/15/2016   Procedure: MRI LUMBAR SPINE WITHOUT;  Surgeon: Medication Radiologist, MD;  Location: [place];  Service: Radiology;  Laterality: N/A;  . RADIOLOGY WITH ANESTHESIA N/A 06/29/2016   Procedure: MRI OF THE BRAIN WITH AND WITHOUT;  Surgeon: Medication Radiologist, MD;  Location: [place];  Service: Radiology;  Laterality: N/A;  . TONSILLECTOMY    . 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  Coronary artery disease Father   . Emphysema Father   . Heart attack Father   . Stroke Father   . Cancer Father     Unsure of type   . Depression Mother   . Bipolar disorder Brother   . Drug abuse Brother   . Bipolar disorder Daughter   . Diabetes Maternal Grandmother     SOCIAL HISTORY:  Social History   Social History  . Marital status: Divorced    Spouse name: N/A  . Number of children: 3  . Years of education: Some college   Occupational History  .      Disability for back pain   Social History Main Topics  . Smoking status: Current Every Day Smoker    Packs/day: 2.00    Years: 44.00    Types: Cigarettes    Start date: 03/04/1971  . Smokeless tobacco: Never Used     Comment: Vapor Cigs  . Alcohol use No     Comment: 10-06-15 per pt no   . Drug use: No     Comment: 10-06-15 per pt no  . Sexual activity: Not Currently    Birth control/ protection: Surgical   Other Topics Concern  . Not on file   Social History Narrative   Lives at home with her daughter.   Right-handed.   1-2 cups coffee in the morning and 2 sodas per day.        PHYSICAL EXAM   Vitals:   08/19/16 0722  BP: (!) 213/105  Pulse: 79  Weight: 207 lb 8 oz (94.1 kg)  Height: 5\' 6"  (1.676 m)    Not recorded      Body mass index is 33.49 kg/m.  PHYSICAL EXAMNIATION:  Gen: NAD, conversant, well nourised, obese, well groomed                     Cardiovascular: Regular rate rhythm, no peripheral edema, warm, nontender. Eyes: Conjunctivae clear without exudates or hemorrhage Neck: Supple, no carotid bruits. Pulmonary: Clear to auscultation bilaterally   NEUROLOGICAL EXAM:  MENTAL STATUS: Speech:    Speech is normal; fluent and spontaneous with normal comprehension.  Cognition:     Orientation to time, place and person     Normal recent and remote memory     Normal Attention span and concentration     Normal Language, naming, repeating,spontaneous speech     Fund of knowledge     CRANIAL NERVES: CN II:  Visual fields are full to confrontation. Fundoscopic exam is normal with sharp discs and no vascular changes. Pupils are round equal and briskly reactive to light. CN III, IV, VI: extraocular movement are normal. No ptosis. CN V: Facial sensation is intact to pinprick in all 3 divisions bilaterally. Corneal responses are intact.  CN VII: Face is symmetric with normal eye closure and smile. CN VIII: Hearing is normal to rubbing fingers CN IX, X: Palate elevates symmetrically. Phonation is normal. CN XI: Head turning and shoulder shrug are intact CN XII: Tongue is midline with normal movements and no atrophy.  MOTOR: Variable effort on examinations, give away weakness on left upper and lower extremity motor examination  REFLEXES: Reflexes are 2+ and symmetric at the biceps, triceps, knees, and ankles. Plantar responses are flexor.  SENSORY: Intact to light touch, pinprick, positional sensation and vibratory sensation are intact in fingers and toes.  COORDINATION: Rapid alternating movements and fine finger movements are intact. There is no dysmetria on finger-to-nose and heel-knee-shin.    GAIT/STANCE: dragging left leg, but with variable effort  Romberg is absent.   DIAGNOSTIC DATA (LABS, IMAGING, TESTING) - I reviewed patient records, labs, notes, testing and imaging myself where available.   ASSESSMENT AND PLAN  Novi Calia is a 56 y.o. female   Recurrent strokelike symptoms, left hemiparesis, left visual field deficit,  MRI of the brain showed supratentorium small vessel disease, the pattern was not typical for MS lesions.  She does has multiple vascular risk factors, hypertension poorly controlled, hyperlipidemia, longtime smoker,   Continue Plavix daily   She has variable effort on examinations   Marcial Pacas, M.D. Ph.D.  [facility] [address], [place], [place] [address] Ph: [phone] Fax: [phone]  CC:  Berkley Harvey, NP

## 2016-08-21 IMAGING — DX DG CHEST 2V
2 series · 2 of 2 positions shown · non-contrast
Comparison: Chest radiograph dated 10/10/2014

CLINICAL DATA: 53-year-old female with chest pain

EXAM:
CHEST  2 VIEW

[chest pa]
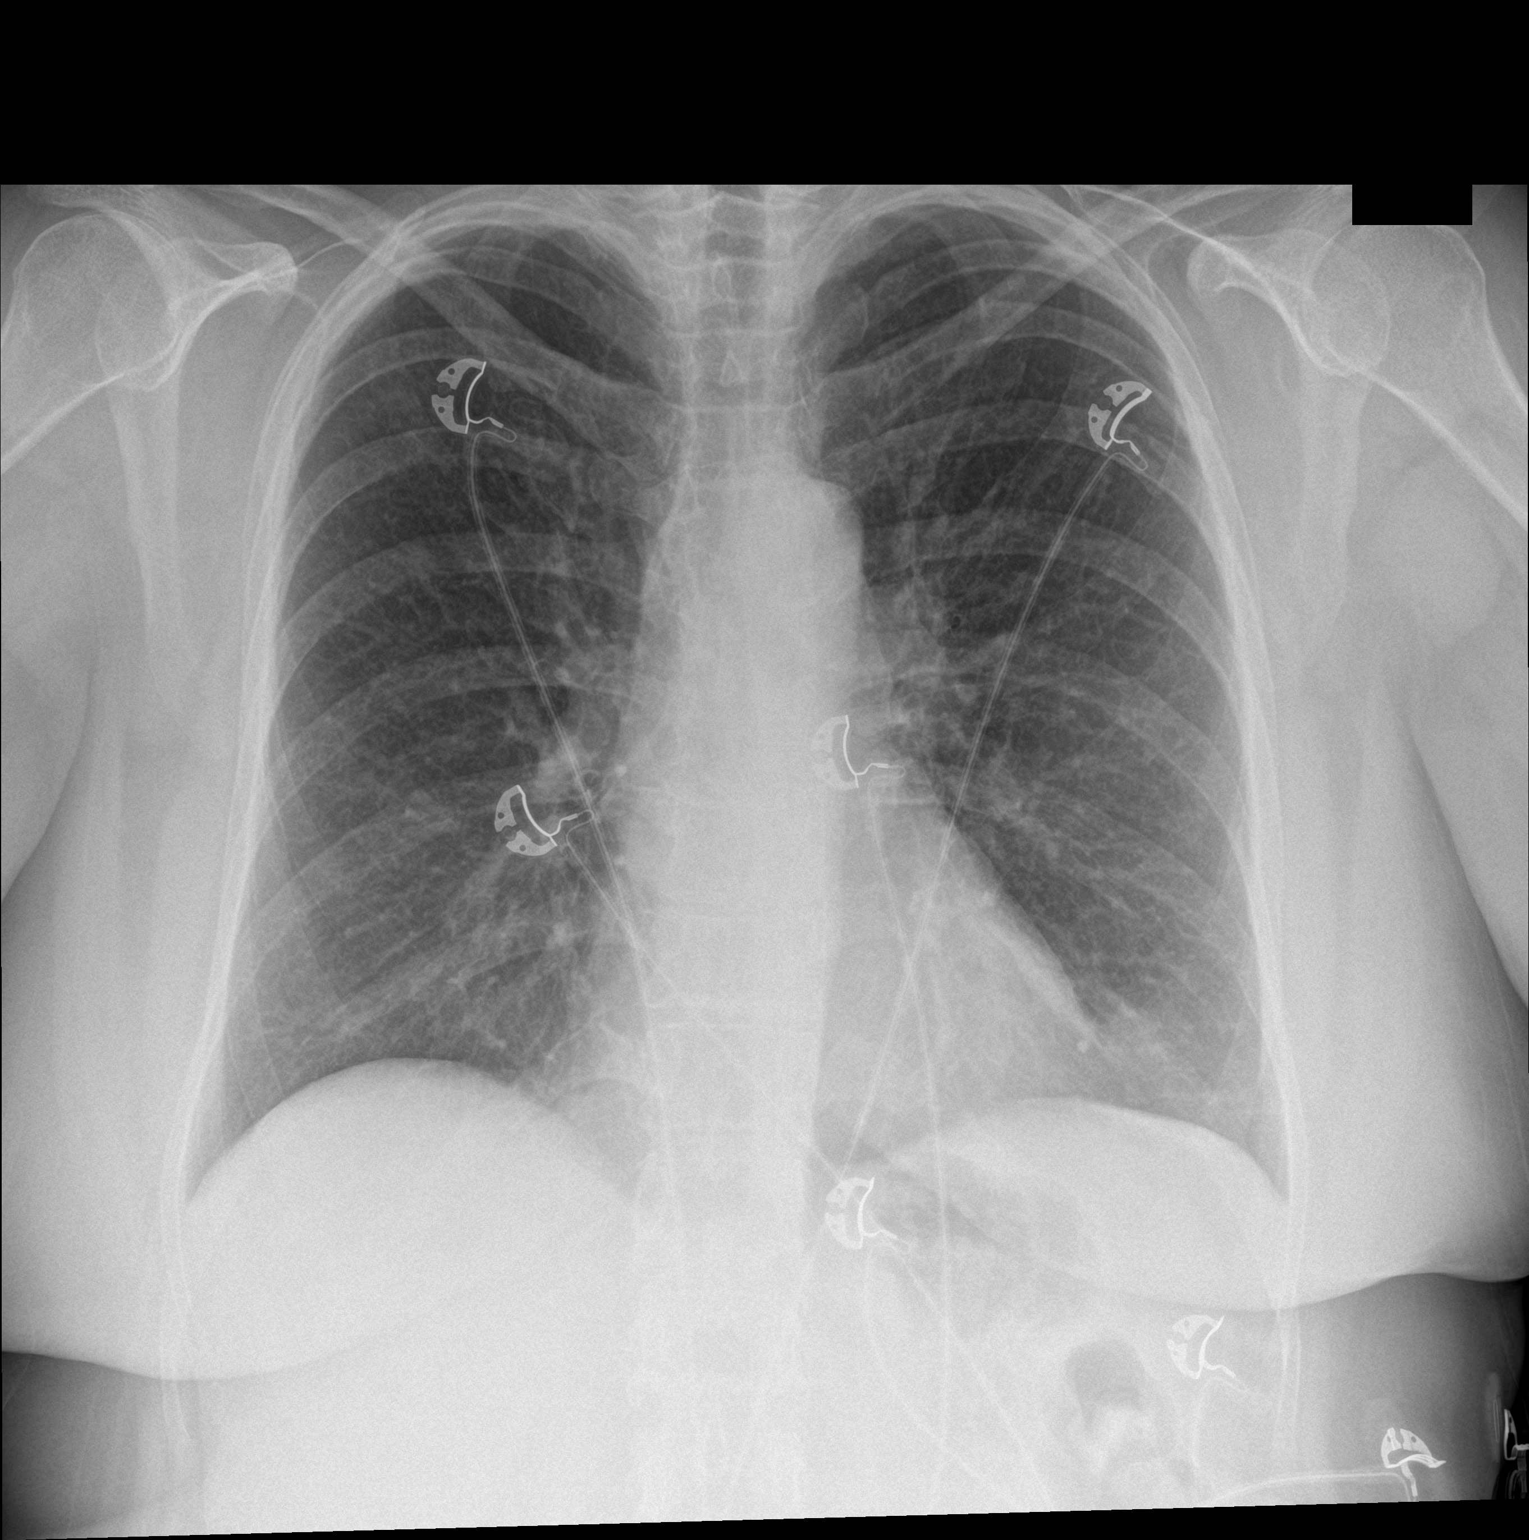

[chest lat]
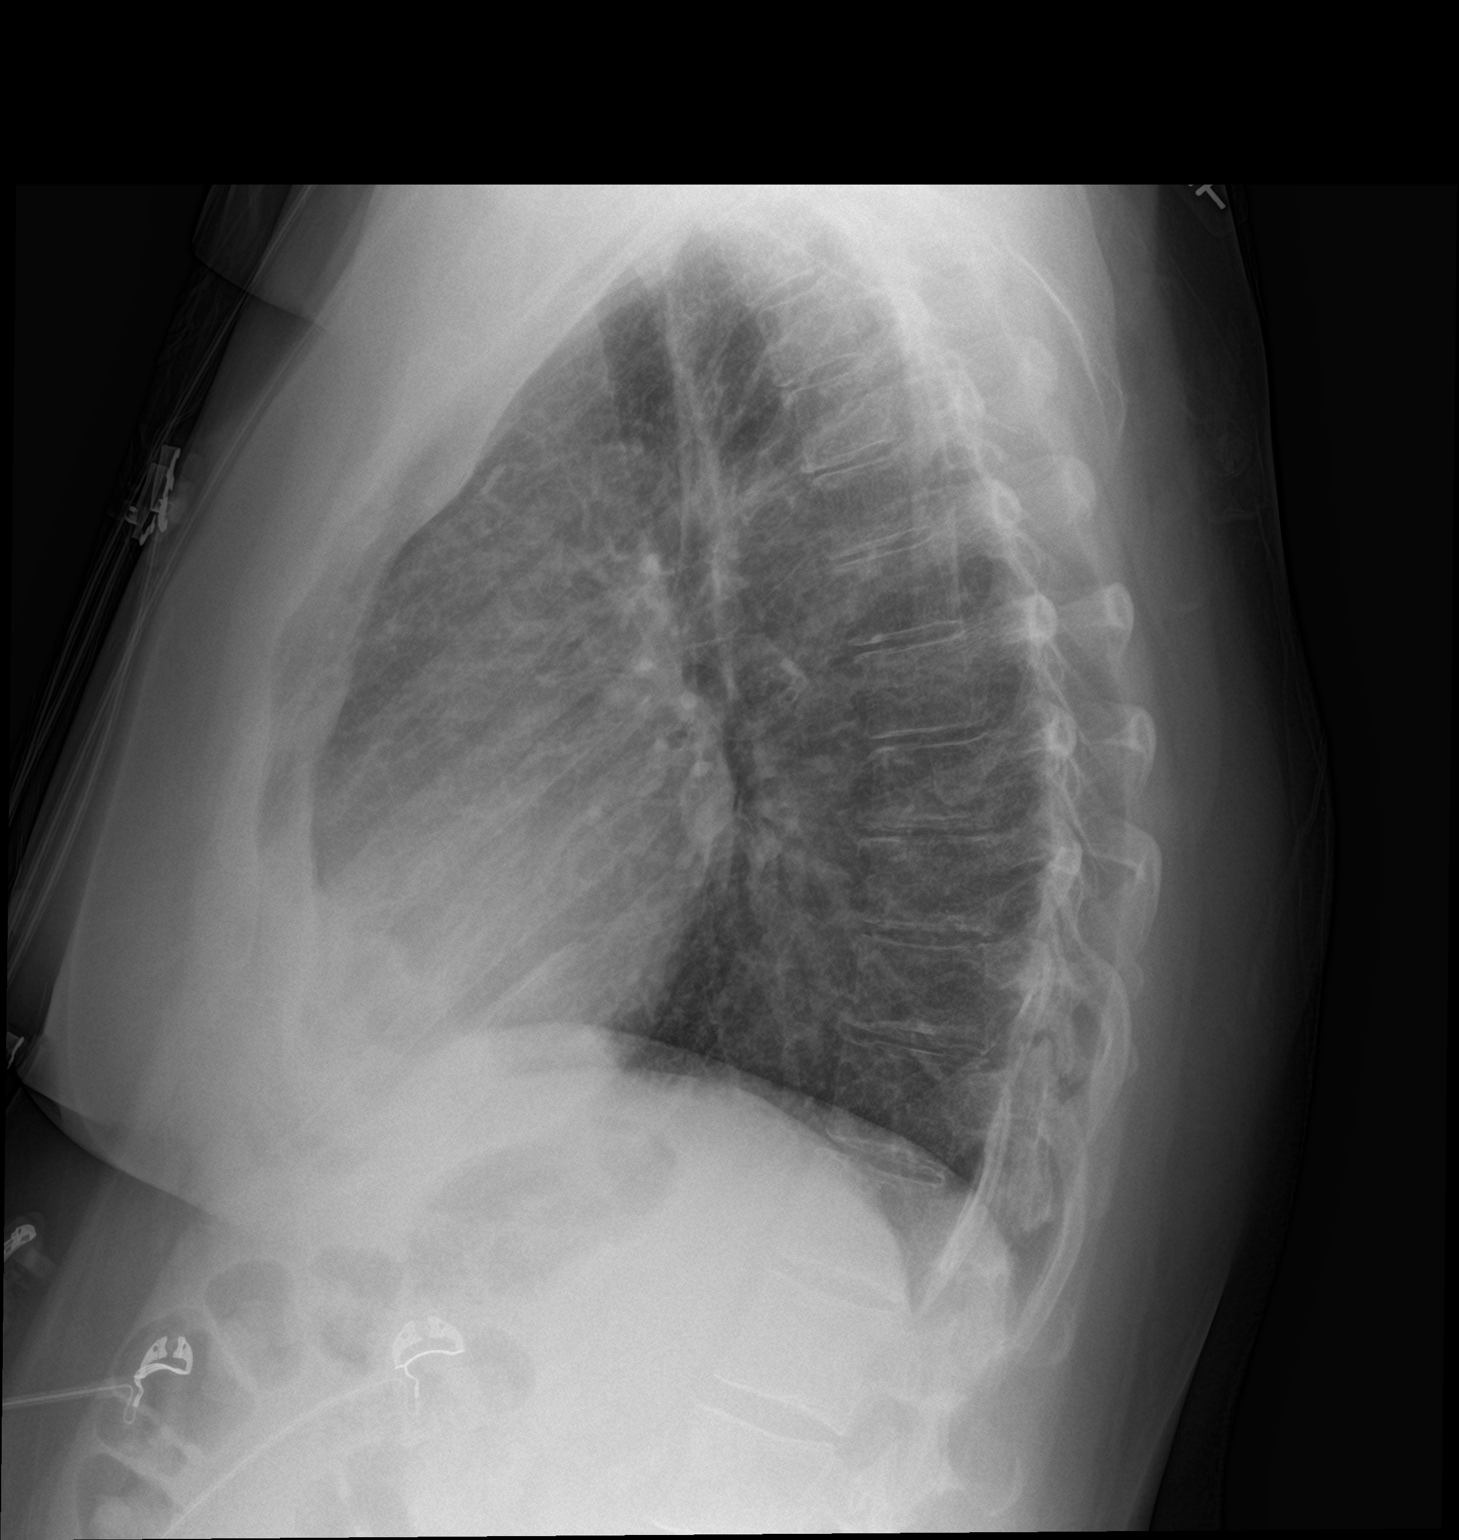

[2 of 2 positions shown; findings below may reference images not displayed]

FINDINGS: The heart size and mediastinal contours are within normal limits.
Both lungs are clear. The visualized skeletal structures are
unremarkable.
IMPRESSION: No active cardiopulmonary disease.

## 2016-08-26 ENCOUNTER — Other Ambulatory Visit: Payer: Self-pay | Admitting: Nephrology

## 2016-08-26 DIAGNOSIS — I10 Essential (primary) hypertension: Secondary | ICD-10-CM

## 2016-08-27 IMAGING — CR DG CHEST 2V
2 series · 2 of 2 positions shown · non-contrast
Comparison: Radiograph dated 12/18/2014

CLINICAL DATA: 53-year-old female with chest pain and high blood
pressure.

EXAM:
CHEST  2 VIEW

[chest pa]
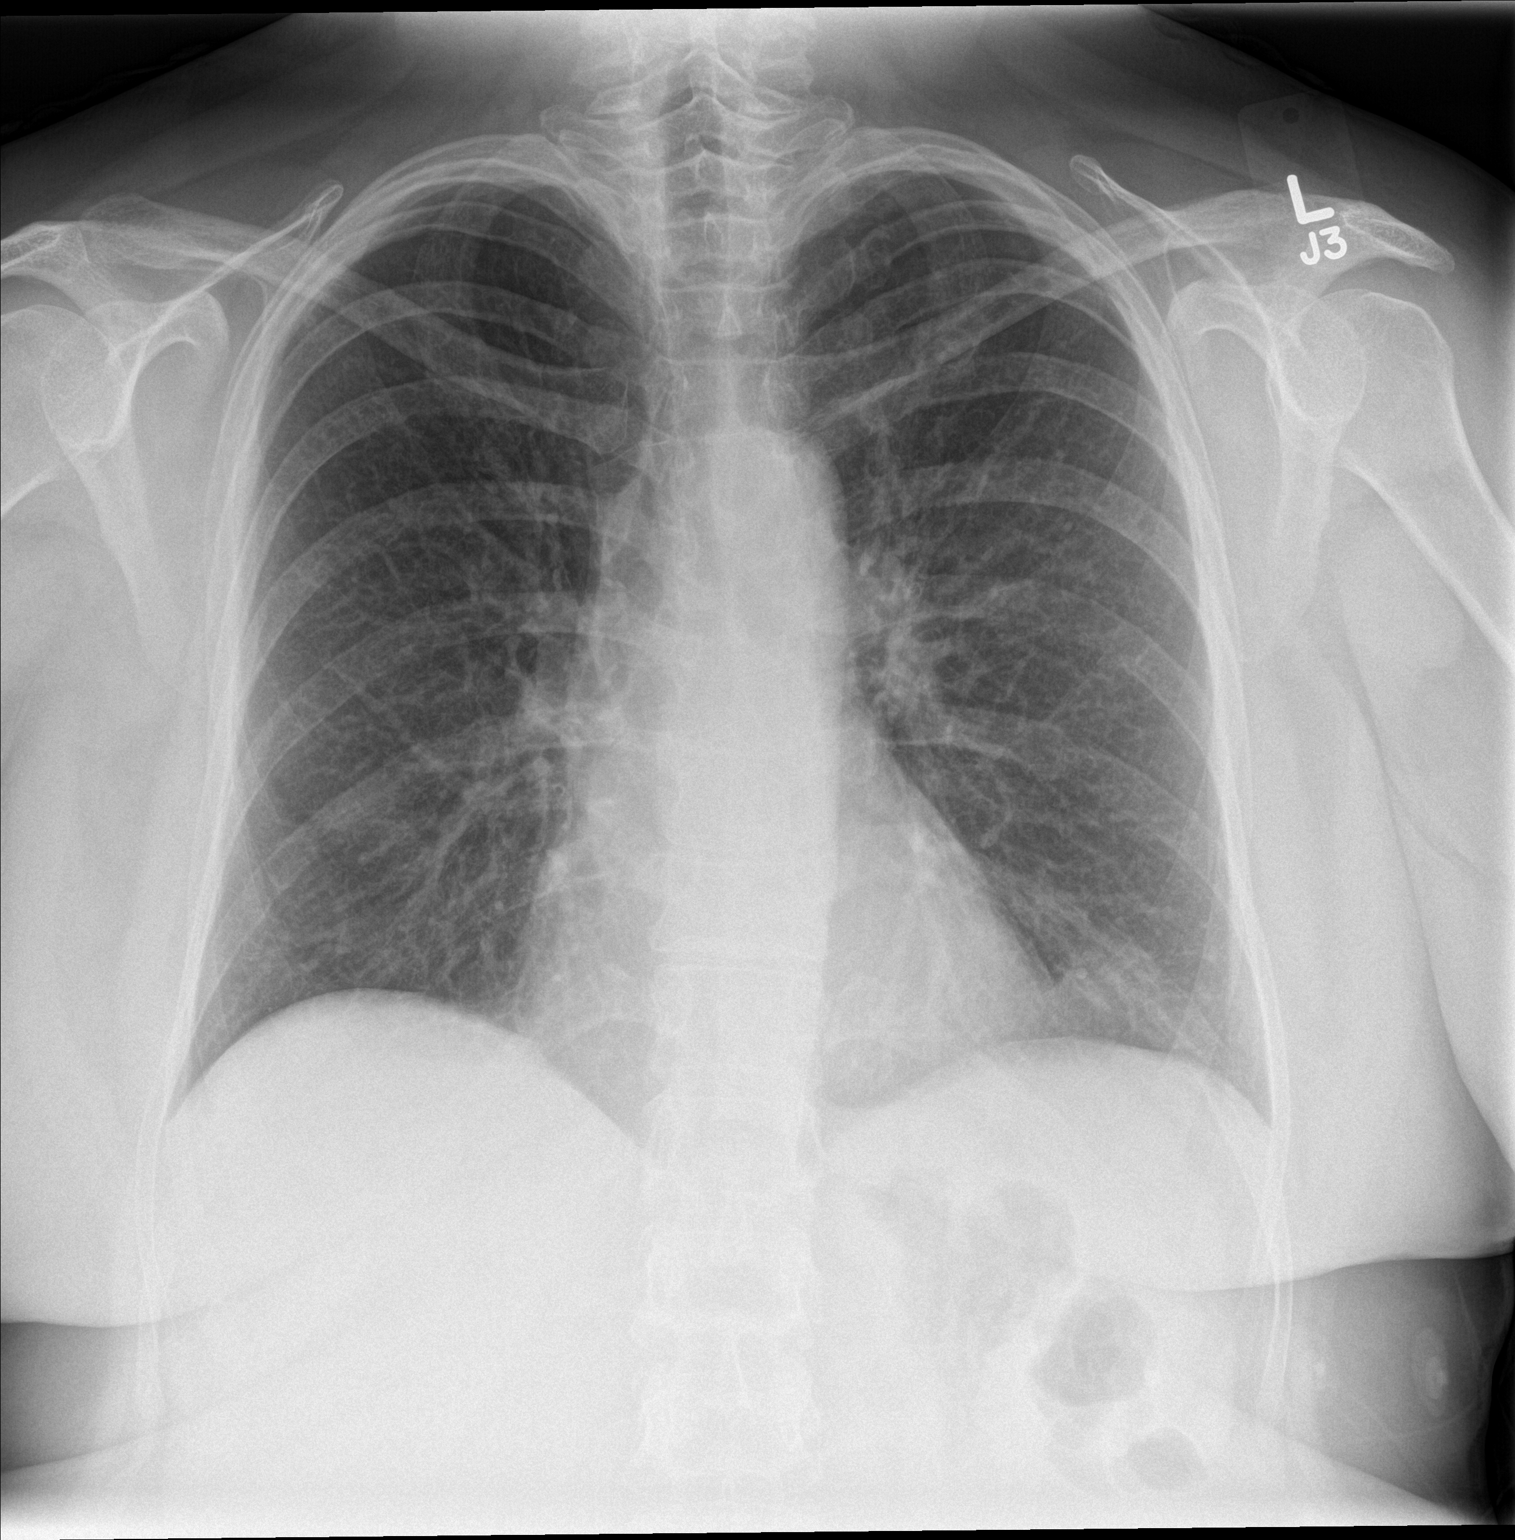

[chest lat]
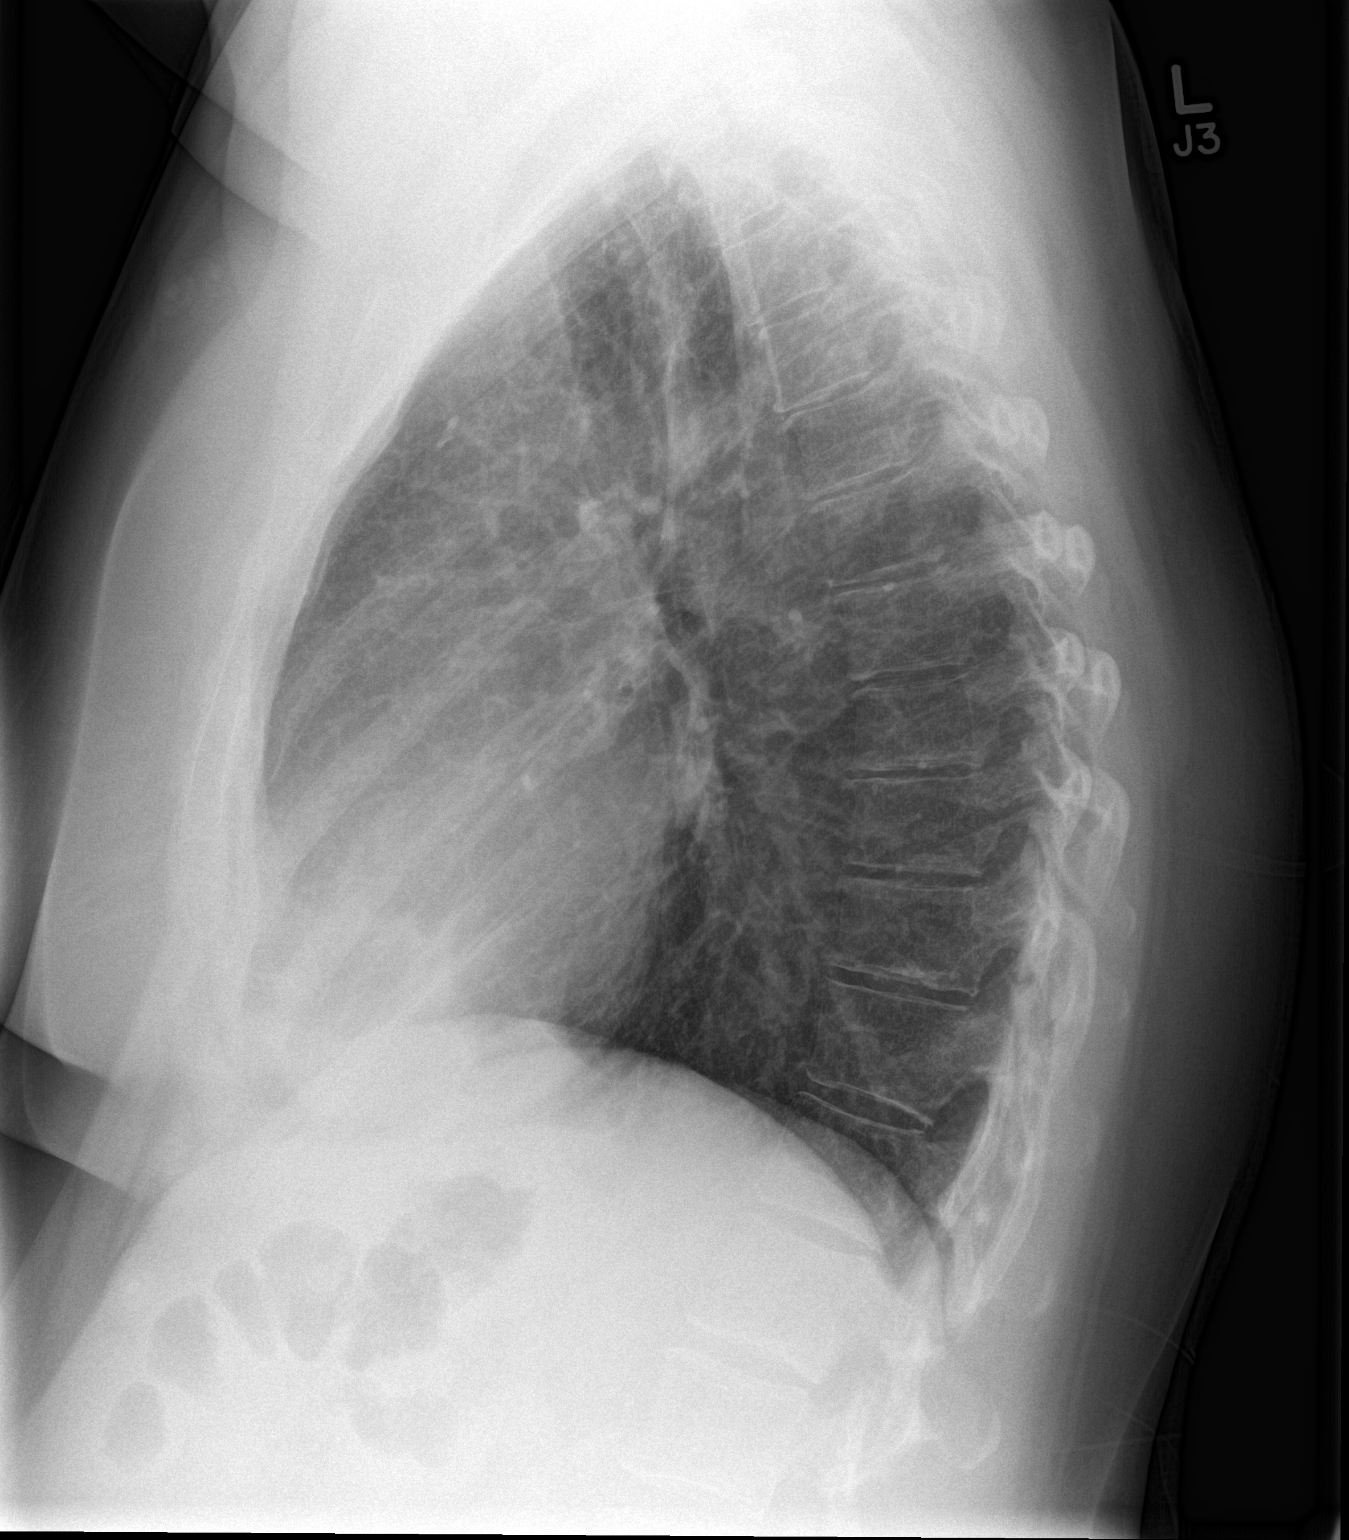

[2 of 2 positions shown; findings below may reference images not displayed]

FINDINGS: The heart size and mediastinal contours are within normal limits.
Both lungs are clear. The visualized skeletal structures are
unremarkable.
IMPRESSION: No active cardiopulmonary disease.

## 2016-08-28 IMAGING — MR MR HEAD W/O CM
5 series · 48 of 48 positions shown · non-contrast
Comparison: Prior CT from 12/24/2014

CLINICAL DATA: Initial evaluation for acute facial droop.

EXAM:
MRI HEAD WITHOUT CONTRAST
TECHNIQUE: Multiplanar, multiecho pulse sequences of the brain and surrounding
structures were obtained without intravenous contrast.

[Series 3: DWI · axial · 3.0mm · 1.09mm/px · z∈[-129,-15]mm · 17 of 84 slices shown (1 of 4)]
[im 1/84]
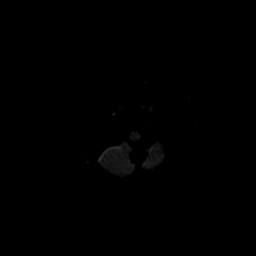
[im 6/84]
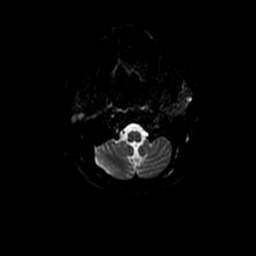
[im 11/84]
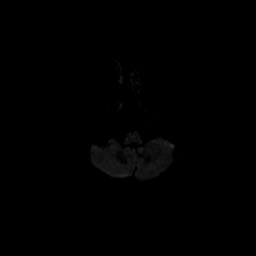
[im 16/84]
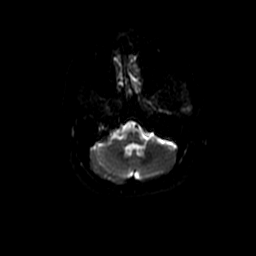
[im 21/84]
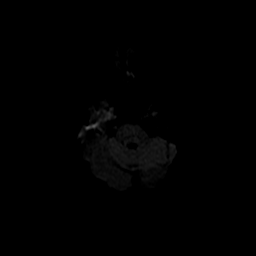
[im 26/84]
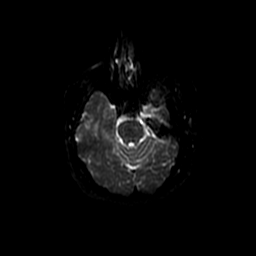
[im 32/84]
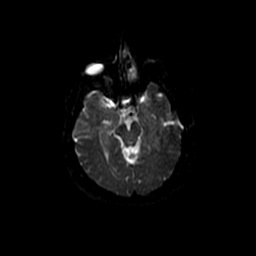
[im 37/84]
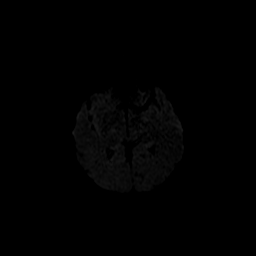
[im 42/84]
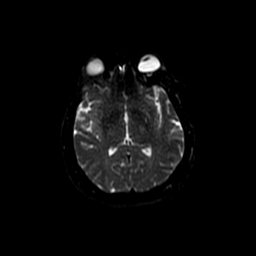
[im 47/84]
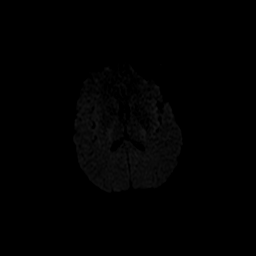
[im 52/84]
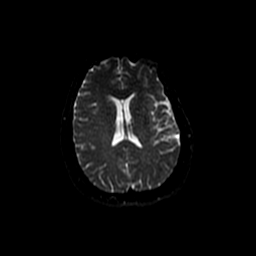
[im 58/84]
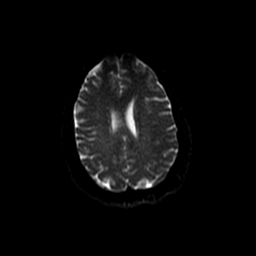
[im 63/84]
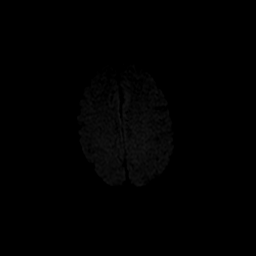
[im 68/84]
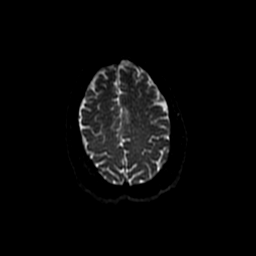
[im 73/84]
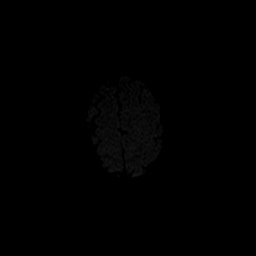
[im 78/84]
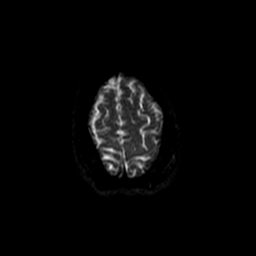
[im 84/84]
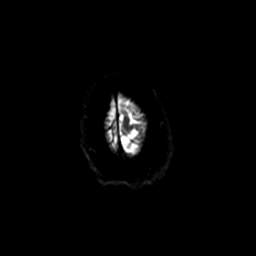

[Series 7: FLAIR · sagittal · 5.0mm · 0.47mm/px · 4 of 22 slices shown]
[im 1/22]
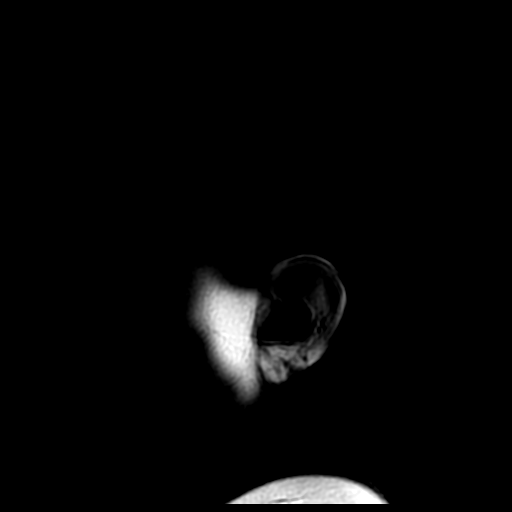
[im 8/22]
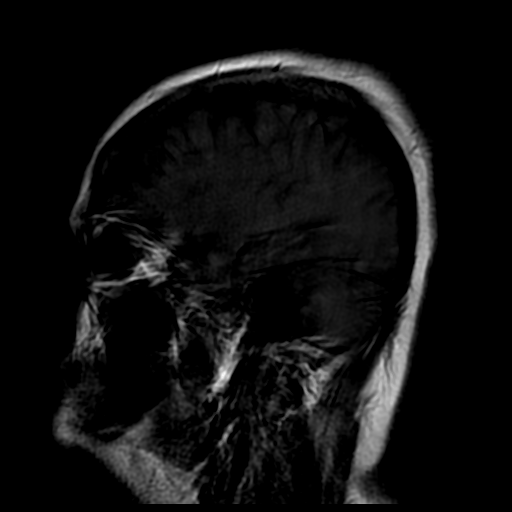
[im 15/22]
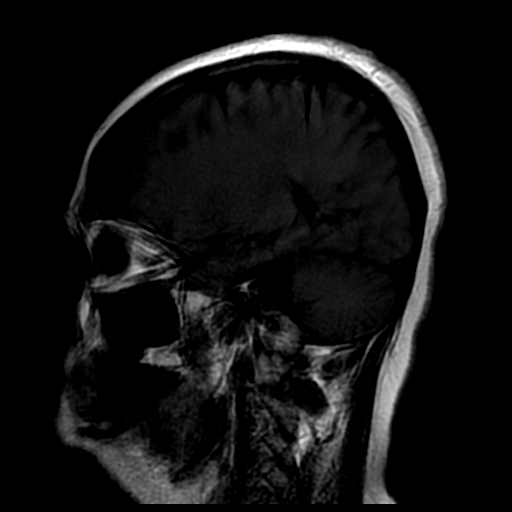
[im 22/22]
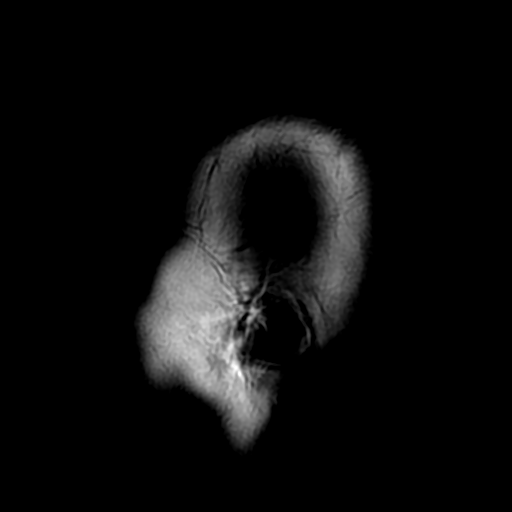

[Series 8: DWI · coronal · 5.0mm · 1.09mm/px · 13 of 66 slices shown (2 of 4)]
[im 1/66]
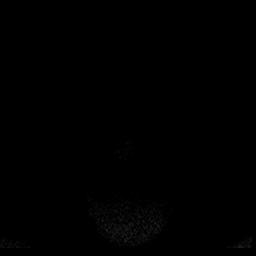
[im 6/66]
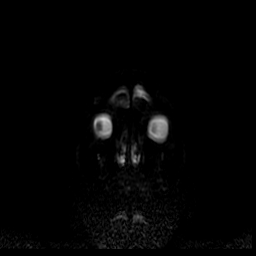
[im 11/66]
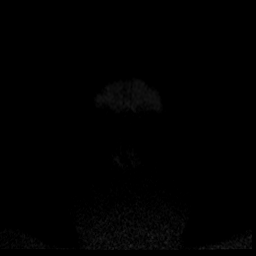
[im 17/66]
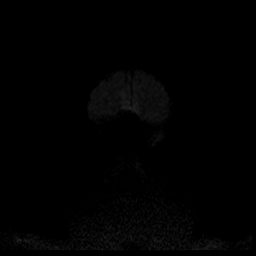
[im 22/66]
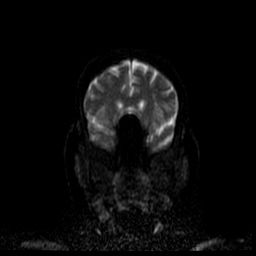
[im 28/66]
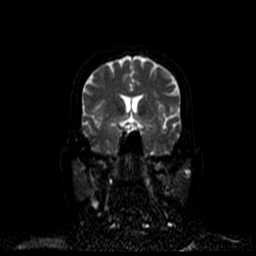
[im 33/66]
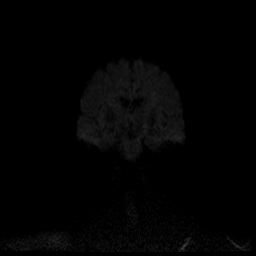
[im 38/66]
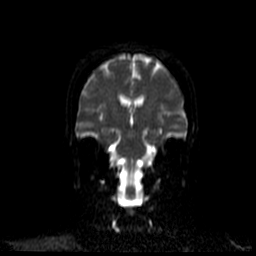
[im 44/66]
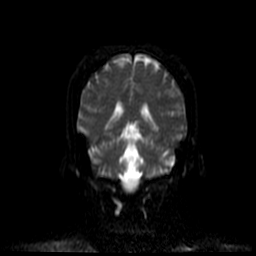
[im 49/66]
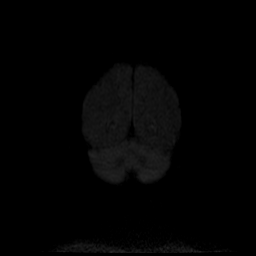
[im 55/66]
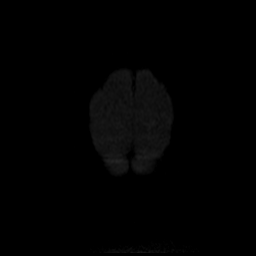
[im 60/66]
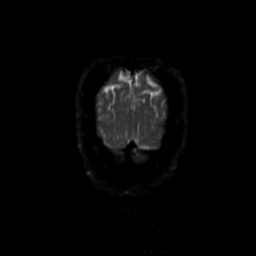
[im 66/66]
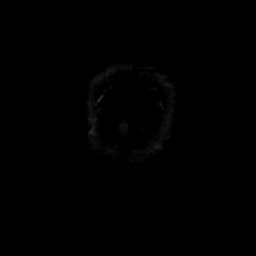

[Series 300: DWI · axial · 3.0mm · 1.09mm/px · z∈[-129,-15]mm · 8 of 42 slices shown (3 of 4)]
[im 1/42]
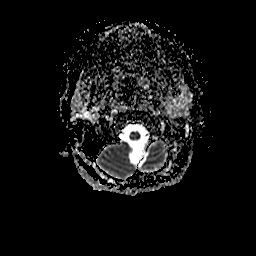
[im 6/42]
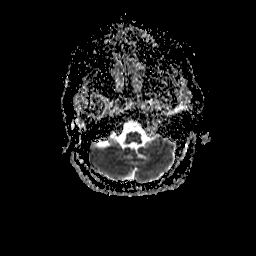
[im 12/42]
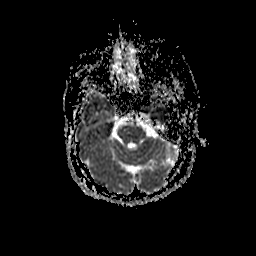
[im 18/42]
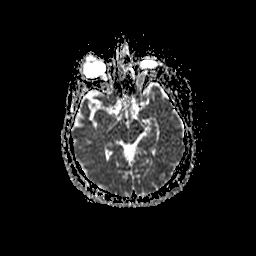
[im 24/42]
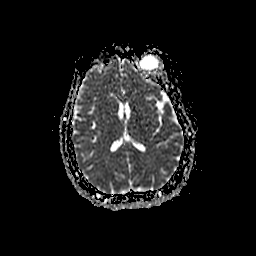
[im 30/42]
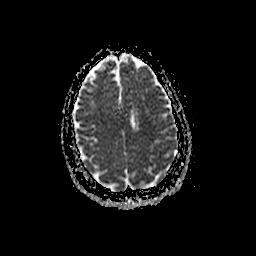
[im 36/42]
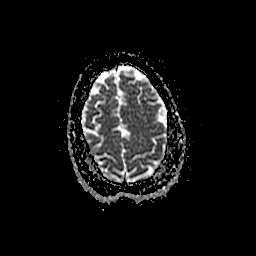
[im 42/42]
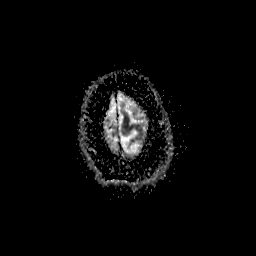

[Series 800: DWI · coronal · 5.0mm · 1.09mm/px · 6 of 33 slices shown (4 of 4)]
[im 1/33]
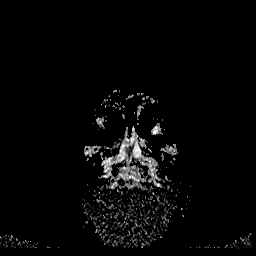
[im 7/33]
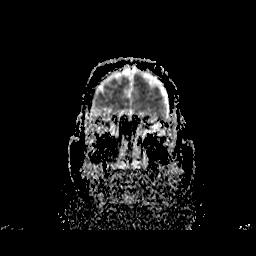
[im 13/33]
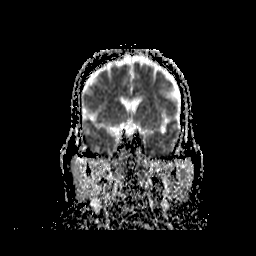
[im 20/33]
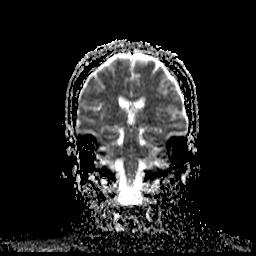
[im 26/33]
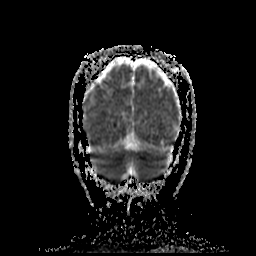
[im 33/33]
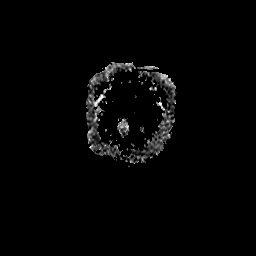

[48 of 48 positions shown; findings below may reference images not displayed]

FINDINGS: Study is limited due to extensive motion artifact and patient's
inability to tolerate the full length of the exam.

No abnormal foci of restricted diffusion to suggest acute
intracranial infarct. Gray-white differentiation grossly maintained.

No definite mass lesion or mass effect. No midline shift. Ventricles
within normal limits for size without evidence of hydrocephalus. No
definite extra-axial fluid collection.

Craniocervical junction widely patent.
IMPRESSION: Limited study demonstrating no acute intracranial infarct.

## 2016-09-03 ENCOUNTER — Ambulatory Visit
Admission: RE | Admit: 2016-09-03 | Discharge: 2016-09-03 | Disposition: A | Discharge status: Home / Self Care | Payer: Medicare Other | Source: Ambulatory Visit | Attending: Nephrology | Admitting: Nephrology

## 2016-09-03 DIAGNOSIS — I10 Essential (primary) hypertension: Secondary | ICD-10-CM

## 2016-09-08 ENCOUNTER — Emergency Department (HOSPITAL_COMMUNITY)
Admission: EM | Admit: 2016-09-08 | Discharge: 2016-09-09 | Disposition: A | Discharge status: Home / Self Care | Payer: Medicare Other | Attending: Emergency Medicine | Admitting: Emergency Medicine

## 2016-09-08 ENCOUNTER — Emergency Department (HOSPITAL_COMMUNITY): Payer: Medicare Other

## 2016-09-08 ENCOUNTER — Encounter (HOSPITAL_COMMUNITY): Payer: Self-pay | Admitting: *Deleted

## 2016-09-08 DIAGNOSIS — R109 Unspecified abdominal pain: Secondary | ICD-10-CM | POA: Insufficient documentation

## 2016-09-08 DIAGNOSIS — I10 Essential (primary) hypertension: Secondary | ICD-10-CM | POA: Diagnosis not present

## 2016-09-08 DIAGNOSIS — J45909 Unspecified asthma, uncomplicated: Secondary | ICD-10-CM | POA: Insufficient documentation

## 2016-09-08 DIAGNOSIS — R110 Nausea: Secondary | ICD-10-CM | POA: Insufficient documentation

## 2016-09-08 DIAGNOSIS — R05 Cough: Secondary | ICD-10-CM | POA: Insufficient documentation

## 2016-09-08 DIAGNOSIS — J449 Chronic obstructive pulmonary disease, unspecified: Secondary | ICD-10-CM | POA: Diagnosis not present

## 2016-09-08 DIAGNOSIS — F17210 Nicotine dependence, cigarettes, uncomplicated: Secondary | ICD-10-CM | POA: Insufficient documentation

## 2016-09-08 DIAGNOSIS — Z79899 Other long term (current) drug therapy: Secondary | ICD-10-CM | POA: Diagnosis not present

## 2016-09-08 DIAGNOSIS — M7989 Other specified soft tissue disorders: Secondary | ICD-10-CM | POA: Diagnosis not present

## 2016-09-08 DIAGNOSIS — I2510 Atherosclerotic heart disease of native coronary artery without angina pectoris: Secondary | ICD-10-CM | POA: Diagnosis not present

## 2016-09-08 DIAGNOSIS — R091 Pleurisy: Secondary | ICD-10-CM | POA: Diagnosis not present

## 2016-09-08 DIAGNOSIS — R0602 Shortness of breath: Secondary | ICD-10-CM | POA: Insufficient documentation

## 2016-09-08 DIAGNOSIS — R0789 Other chest pain: Secondary | ICD-10-CM | POA: Diagnosis present

## 2016-09-08 LAB — BASIC METABOLIC PANEL
ANION GAP: 8 (ref 5–15)
BUN: 14 mg/dL (ref 6–20)
CHLORIDE: 105 mmol/L (ref 101–111)
CO2: 21 mmol/L — AB (ref 22–32)
Calcium: 8.4 mg/dL — ABNORMAL LOW (ref 8.9–10.3)
Creatinine, Ser: 0.7 mg/dL (ref 0.44–1.00)
GFR calc non Af Amer: >60 mL/min (ref >60)
Glucose, Bld: 157 mg/dL — ABNORMAL HIGH (ref 65–99)
Potassium: 3.3 mmol/L — ABNORMAL LOW (ref 3.5–5.1)
SODIUM: 134 mmol/L — AB (ref 135–145)

## 2016-09-08 LAB — CBC
HCT: 40.1 % (ref 36.0–46.0)
HEMOGLOBIN: 13.4 g/dL (ref 12.0–15.0)
MCH: 29.9 pg (ref 26.0–34.0)
MCHC: 33.4 g/dL (ref 30.0–36.0)
MCV: 89.5 fL (ref 78.0–100.0)
PLATELETS: 265 10*3/uL (ref 150–400)
RBC: 4.48 MIL/uL (ref 3.87–5.11)
RDW: 14.1 % (ref 11.5–15.5)
WBC: 9.2 10*3/uL (ref 4.0–10.5)

## 2016-09-08 LAB — TROPONIN I

## 2016-09-08 MED ORDER — ONDANSETRON HCL 4 MG/2ML IJ SOLN
4.0000 mg | Freq: Once | INTRAMUSCULAR | Status: AC
Start: 2016-09-09 — End: 2016-09-09
  Administered 2016-09-09: 4 mg via INTRAVENOUS
  Filled 2016-09-08: qty 2

## 2016-09-08 MED ORDER — FENTANYL CITRATE (PF) 100 MCG/2ML IJ SOLN
50.0000 ug | Freq: Once | INTRAMUSCULAR | Status: AC
  Administered 2016-09-09: 50 ug via INTRAVENOUS
  Filled 2016-09-08: qty 2

## 2016-09-08 NOTE — ED Notes (Signed)
Pt returned from X-ray.  

## 2016-09-08 NOTE — ED Provider Notes (Signed)
[facility] DEPT Provider Note   CSN: [id number] Arrival date & time: 09/08/16  2217  By signing my name below, I, Margit Banda, attest that this documentation has been prepared under the direction and in the presence of Ripley Fraise, MD. Electronically Signed: Margit Banda, ED Scribe. 09/08/16. 11:50 PM.   History   Chief Complaint Chief Complaint  Patient presents with  . Chest Pain    HPI Katrina [name] is a 56 y.o. female with a PMHx of CAD, COPD, HLD, HTN, and TIA, who presents to the Emergency Department complaining of constant sharp CP since 9 am (09/08/16). Associated sx include sweating, SOB, abdominal pain and LE edema. Breathing, movement and laying flat exacerbate her pain, stating "it feels like pressure on her chest". Mild heart attack in 2000. No recent blood clots. Cardiac catheterization ~ 2 years ago. Last experienced pain similar to onset was in 2010. She was hospitalized and they found small blockages. Pt denies fever and vomiting.   The history is provided by the patient and medical records. No language interpreter was used.  Chest Pain   This is a new problem. The current episode started 12 to 24 hours ago. The problem occurs constantly. The problem has been gradually worsening. The pain is associated with breathing and movement. The pain is moderate. The quality of the pain is described as sharp. The pain radiates to the left shoulder and left neck. The symptoms are aggravated by exertion, certain positions and deep breathing. Associated symptoms include abdominal pain, cough, lower extremity edema, nausea and shortness of breath. Pertinent negatives include no fever and no vomiting. She has tried rest for the symptoms. The treatment provided no relief. Risk factors include smoking/tobacco exposure.  Her past medical history is significant for CAD, COPD, hyperlipidemia, hypertension, strokes and TIA.  Her family medical history is significant for CAD, diabetes,  heart disease and stroke.  Procedure history is positive for cardiac catheterization.    Past Medical History:  Diagnosis Date  . Anginal pain ([place])   . Anxiety    takes Valium daily as needed  . Asthma    has inhalers but doesn't use  . CAD (coronary artery disease)    Moderate nonobstructive 2012-2013, [place]; No angiographic evidence of CAD 01/31/15 [facility]  . Chronic back pain    budlging disc   . COPD (chronic obstructive pulmonary disease) ([place])   . Diverticulitis   . Family history of adverse reaction to anesthesia    daughter gets extremely sick   . GERD (gastroesophageal reflux disease)    takes Dexilant daily  . History of blood transfusion 1975/1976   no abnormal reaction noted  . History of bronchitis 06/2015  . History of colitis   . History of colon polyps    benign  . History of gastric ulcer   . History of migraine    last one a few days ago  . History of MRSA infection   . Hyperlipidemia    was on meds but has been off over a yr  . Hypertension    takes Amlodipine and Maxzide daily  . Insomnia    takes Ambien nightly  . Lung nodules   . Microscopic hematuria   . MS (multiple sclerosis) ([place])    questionable per pt  . Peripheral neuropathy (HCC)    weakness,numbness,and tingling. Takes Gabapentin daily  . Pneumonia 06/2015  . Restless leg syndrome    takes Requip daily  . Stroke [facility])    takes Plavix  daily.Left sided weakness    Patient Active Problem List   Diagnosis Date Noted  . Cerebral microvascular disease 08/19/2016  . Non compliance with medical treatment 05/11/2016  . Leg pain, anterior, right 05/11/2016  . Syncope 05/02/2016  . Type 2 diabetes mellitus with diabetic neuropathy ([place]) 05/02/2016  . Restless leg syndrome 05/02/2016  . Cerebrovascular accident (CVA) ([place])   . Vascular headache   . History of chest pain   . Generalized anxiety disorder   . Chronic bilateral low back pain without sciatica   . Peripheral neuropathy ([place])    . Migraine without status migrainosus, not intractable   . PTSD (post-traumatic stress disorder)   . History of cerebrovascular accident (CVA) with residual deficit   . CVA (cerebral vascular accident) ([place]) 04/03/2016  . Stroke (cerebrum) ([place]) 04/03/2016  . Post traumatic stress disorder 10/06/2015  . Precordial pain   . Migraine syndrome 01/24/2015  . Migraine 01/24/2015  . TIA (transient ischemic attack) 12/25/2014  . Hypertensive urgency   . Other chest pain   . Malignant hypertension   . Hypokalemia 04/16/2014  . Muscle weakness (generalized) 04/05/2014  . Stiffness of left hip joint 04/05/2014  . Pain in left hip 04/05/2014  . Diastolic dysfunction 34/19/6222  . Normal coronary arteries 03/20/2014  . PUD (peptic ulcer disease) 03/20/2014  . Type 2 diabetes, uncontrolled, with neuropathy ([place]) 03/20/2014  . Morbid obesity ([place]) 03/20/2014  . Left-sided weakness 03/20/2014  . Paresthesias 03/20/2014  . Cerebrovascular disease 03/20/2014  . Dyspnea 03/05/2012  . Tobacco abuse 03/05/2012  . COPD (chronic obstructive pulmonary disease) ([place]) 11/23/2011  . GERD (gastroesophageal reflux disease) 11/23/2011  . Uncontrolled hypertension 11/23/2011    Past Surgical History:  Procedure Laterality Date  . ABDOMINAL HYSTERECTOMY    . ANKLE SURGERY Bilateral   . APPENDECTOMY    . BACK SURGERY     fusion  . CARDIAC CATHETERIZATION N/A 01/31/2015   Procedure: Left Heart Cath and Coronary Angiography;  Surgeon: Burnell Blanks, MD;  Location: [facility];  Service: Cardiovascular;  Laterality: N/A;  . CHOLECYSTECTOMY    . COLONOSCOPY    . ESOPHAGOGASTRODUODENOSCOPY    . NOSE SURGERY    . RADIOLOGY WITH ANESTHESIA N/A 01/15/2016   Procedure: MRI LUMBAR SPINE WITHOUT;  Surgeon: Medication Radiologist, MD;  Location: [facility];  Service: Radiology;  Laterality: N/A;  . RADIOLOGY WITH ANESTHESIA N/A 06/29/2016   Procedure: MRI OF THE BRAIN WITH AND WITHOUT;  Surgeon:  Medication Radiologist, MD;  Location: [facility];  Service: Radiology;  Laterality: N/A;  . TONSILLECTOMY    . 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 tumor removed from left breast      OB History    No data available       Home Medications    Prior to Admission medications   Medication Sig Start Date End Date Taking? Authorizing Provider  albuterol (PROVENTIL HFA;VENTOLIN HFA) 108 (90 Base) MCG/ACT inhaler Inhale 1 puff into the lungs every 4 (four) hours as needed for wheezing.   Yes Historical Provider, MD  albuterol (PROVENTIL) (2.5 MG/3ML) 0.083% nebulizer solution Take 3 mLs by nebulization daily as needed for shortness of breath. 07/23/16  Yes Historical Provider, MD  amLODipine (NORVASC) 10 MG tablet Take 1 tablet (10 mg total) by mouth daily. 06/05/14  Yes Herminio Commons, MD  ANORO ELLIPTA 62.5-25 MCG/INH AEPB Inhale 1 puff into the lungs daily. 08/19/16  Yes Historical Provider, MD  aspirin-acetaminophen-caffeine Jearld Adjutant  MIGRAINE) 250-250-65 MG tablet Take 2 tablets by mouth every 6 (six) hours as needed for migraine.   Yes Historical Provider, MD  clopidogrel (PLAVIX) 75 MG tablet Take 75 mg by mouth daily.   Yes Historical Provider, MD  dexlansoprazole (DEXILANT) 60 MG capsule Take 1 capsule (60 mg total) by mouth every evening. Patient taking differently: Take 60 mg by mouth daily.  03/20/14  Yes Rexene Alberts, MD  diazepam (VALIUM) 5 MG tablet Take 1 tablet (5 mg total) by mouth every 8 (eight) hours as needed for anxiety. Patient taking differently: Take 5 mg by mouth daily as needed for anxiety.  05/04/16  Yes Orvan Falconer, MD  gabapentin (NEURONTIN) 600 MG tablet Take 1 tablet (600 mg total) by mouth 3 (three) times daily. Patient taking differently: Take 600 mg by mouth 4 (four) times daily.  04/05/16  Yes [name], NP  losartan (COZAAR) 50 MG tablet Take 50 mg by mouth 2 (two) times daily.  06/09/16  Yes Historical Provider, MD  methocarbamol (ROBAXIN) 750 MG tablet  Take 750 mg by mouth 2 (two) times daily as needed for muscle spasms. 05/30/16  Yes Historical Provider, MD  oxyCODONE-acetaminophen (PERCOCET) 7.5-325 MG tablet Take 1 tablet by mouth every 6 (six) hours as needed (for pain.).  03/25/16  Yes Historical Provider, MD  rOPINIRole (REQUIP) 4 MG tablet Take 4 mg by mouth at bedtime.   Yes Historical Provider, MD  zolpidem (AMBIEN) 10 MG tablet Take 10 mg by mouth at bedtime as needed for sleep.  03/25/16  Yes Historical Provider, MD    Family History Family History  Problem Relation Age of Onset  . Coronary artery disease Father   . Emphysema Father   . Heart attack Father   . Stroke Father   . Cancer Father     Unsure of type   . Depression Mother   . Bipolar disorder Brother   . Drug abuse Brother   . Bipolar disorder Daughter   . Diabetes Maternal Grandmother     Social History Social History  Substance Use Topics  . Smoking status: Current Every Day Smoker    Packs/day: 2.00    Years: 44.00    Types: Cigarettes    Start date: 03/04/1971  . Smokeless tobacco: Never Used     Comment: Vapor Cigs  . Alcohol use No     Comment: 10-06-15 per pt no      Allergies   Penicillins; Zithromax [azithromycin]; Aspirin; Pineapple; Strawberry extract; Aspartame and phenylalanine; Mushroom extract complex; Nicardipine; and Other   Review of Systems Review of Systems  Constitutional: Negative for fever.  Respiratory: Positive for cough and shortness of breath.   Cardiovascular: Positive for chest pain.  Gastrointestinal: Positive for abdominal pain and nausea. Negative for vomiting.  Musculoskeletal: Positive for joint swelling.  All other systems reviewed and are negative.    Physical Exam Updated Vital Signs BP (!) 214/94   Pulse 86   Temp 98 F (36.7 C) (Oral)   Resp 20   Ht 5' 6.5" (1.689 m)   Wt 212 lb (96.2 kg)   SpO2 99%   BMI 33.71 kg/m   Physical Exam  CONSTITUTIONAL: Well developed/well nourished HEAD:  Normocephalic/atraumatic EYES:PERRL. Left sided blindness chronic. ENMT: Mucous membranes moist NECK: supple no meningeal signs SPINE/BACK:entire spine nontender CV: S1/S2 noted, no murmurs/rubs/gallops noted LUNGS: Lungs are clear to auscultation bilaterally, no apparent distress.  ABDOMEN: soft, nontender, no rebound or guarding, bowel sounds noted throughout  abdomen.  GU:no cva tenderness NEURO: Pt is awake/alert/appropriate, moves all extremitiesx4.  No facial droop.   EXTREMITIES: pulses normal/equalx4, full ROM. SKIN: warm, color normal PSYCH: no abnormalities of mood noted, alert and oriented to situation.   ED Treatments / Results  DIAGNOSTIC STUDIES: Oxygen Saturation is 99% on RA, normal by my interpretation.   COORDINATION OF CARE: 11:20 PM-Discussed next steps with pt. Pt verbalized understanding and is agreeable with the plan.    Labs (all labs ordered are listed, but only abnormal results are displayed) Labs Reviewed  BASIC METABOLIC PANEL - Abnormal; Notable for the following:       Result Value   Sodium 134 (*)    Potassium 3.3 (*)    CO2 21 (*)    Glucose, Bld 157 (*)    Calcium 8.4 (*)    All other components within normal limits  CBC  TROPONIN I  D-DIMER, QUANTITATIVE (NOT AT [facility])  TROPONIN I    EKG  EKG Interpretation  Date/Time:  Wednesday September 08 2016 22:32:21 EDT Ventricular Rate:  85 PR Interval:    QRS Duration: 95 QT Interval:  408 QTC Calculation: 486 R Axis:   44 Text Interpretation:  Sinus rhythm Borderline prolonged QT interval Confirmed by Christy Gentles  MD, Kayana Thoen ([id number]) on 09/08/2016 11:05:52 PM       Radiology Dg Chest 2 View  Result Date: 09/08/2016 CLINICAL DATA:  Left-sided chest pain EXAM: CHEST  2 VIEW COMPARISON:  08/25/2016 FINDINGS: The heart size and mediastinal contours are within normal limits. Both lungs are clear. The visualized skeletal structures are unremarkable. IMPRESSION: No active cardiopulmonary disease.  Electronically Signed   By: Ashley Royalty M.D.   On: 09/08/2016 23:32    Procedures Procedures (including critical care time)  Medications Ordered in ED Medications  fentaNYL (SUBLIMAZE) injection 50 mcg (50 mcg Intravenous Given 09/09/16 0019)  ondansetron (ZOFRAN) injection 4 mg (4 mg Intravenous Given 09/09/16 0018)     Initial Impression / Assessment and Plan / ED Course  I have reviewed the triage vital signs and the nursing notes.  Pertinent labs & imaging results that were available during my care of the patient were reviewed by me and considered in my medical decision making (see chart for details).     1:28 AM Pt in the ED for sharp CP She is well appearing/nontoxic, watching TV Per patient she has h/o CAD, though cardiac cath from 2016 showed normal coronaries She also reports h/o stroke, though per neuro note from 03/2016 showed "stroke symptoms but likely nonorganic paterrn of weakness"  Here is she is well appearing Troponin negative Due to previous negative cath, sharp chest pain and unremarkable EKG - my suspicion for ACS is LOW.   She has chronically elevated blood pressure, normal CXR - I doubt Aortic Dissection As for PE - d-dimer negative  Pt reports feeling overall improved Will d/c home and can followup as outpatient   BP (!) 193/86   Pulse 73   Temp 98 F (36.7 C) (Oral)   Resp 18   Ht 5' 6.5" (1.689 m)   Wt 96.2 kg   SpO2 96%   BMI 33.71 kg/m   Final Clinical Impressions(s) / ED Diagnoses   Final diagnoses:  Pleurisy  Essential hypertension    New Prescriptions New Prescriptions   No medications on file   I personally performed the services described in this documentation, which was scribed in my presence. The recorded information has been reviewed and  is accurate.       Ripley Fraise, MD 09/09/16 [phone]

## 2016-09-08 NOTE — ED Triage Notes (Signed)
Pt c/o left side chest pressure, sharp pain that started this am with sob, nausea, generalized weakness, has hx of angina but reports that this feels different,

## 2016-09-09 DIAGNOSIS — R091 Pleurisy: Secondary | ICD-10-CM | POA: Diagnosis not present

## 2016-09-09 LAB — TROPONIN I

## 2016-09-09 LAB — D-DIMER, QUANTITATIVE (NOT AT ARMC): D DIMER QUANT: 0.38 ug{FEU}/mL (ref 0.00–0.50)

## 2016-09-09 NOTE — ED Notes (Signed)
Pt ambulatory to waiting room. Pt verbalized understanding of discharge instructions.   

## 2016-09-24 ENCOUNTER — Encounter: Payer: Self-pay | Admitting: Internal Medicine

## 2016-09-24 ENCOUNTER — Ambulatory Visit (INDEPENDENT_AMBULATORY_CARE_PROVIDER_SITE_OTHER): Payer: Medicare Other | Admitting: Internal Medicine

## 2016-09-24 DIAGNOSIS — F17210 Nicotine dependence, cigarettes, uncomplicated: Secondary | ICD-10-CM | POA: Diagnosis not present

## 2016-09-24 DIAGNOSIS — R0609 Other forms of dyspnea: Secondary | ICD-10-CM | POA: Diagnosis not present

## 2016-09-24 DIAGNOSIS — R0600 Dyspnea, unspecified: Secondary | ICD-10-CM

## 2016-09-24 DIAGNOSIS — I10 Essential (primary) hypertension: Secondary | ICD-10-CM | POA: Diagnosis not present

## 2016-09-24 DIAGNOSIS — R0602 Shortness of breath: Secondary | ICD-10-CM

## 2016-09-24 NOTE — Assessment & Plan Note (Signed)
>   3 m  I took an extended  opportunity with this patient to outline the consequences of continued cigarette use  in airway disorders based on all the data we have from the multiple national lung health studies (perfomed over decades at millions of dollars in cost)  indicating that smoking cessation, not choice of inhalers or physicians, is the most important aspect of care.   

## 2016-09-24 NOTE — Patient Instructions (Addendum)
You don't have significant copd and you never will if you stop smoking now  Change dexilant to 60 mg Take 30-60 min before first meal of the day and take pepcid 20  Mg at bedtime  GERD (REFLUX)  is an extremely common cause of respiratory symptoms just like yours , many times with no obvious heartburn at all.    It can be treated with medication, but also with lifestyle changes including elevation of the head of your bed (ideally with 6 inch  bed blocks),  Smoking cessation, avoidance of late meals, excessive alcohol, and avoid fatty foods, chocolate, peppermint, colas, red wine, and acidic juices such as orange juice.  NO MINT OR MENTHOL PRODUCTS SO NO COUGH DROPS  USE SUGARLESS CANDY INSTEAD (Jolley ranchers or Stover's or Life Savers) or even ice chips will also do - the key is to swallow to prevent all throat clearing. NO OIL BASED VITAMINS - use powdered substitutes.    I strongly recommend you change the norvasc to minoxidil and the losartan to avapro and get your blood pressure back down to safer ranges as this is the likely cause of your shortness of breath  Refill your catapres patch asap as you likely are having rebound hypertension now - go to ER if breathing worse or any headache    Pulmonary medications and follow up are not needed

## 2016-09-24 NOTE — Assessment & Plan Note (Signed)
Body mass index is 33.71 kg/m.   Lab Results  Component Value Date   TSH 3.228 01/24/2015     Contributing to gerd risk/ doe/reviewed the need and the process to achieve and maintain neg calorie balance > defer f/u primary care including intermittently monitoring thyroid status

## 2016-09-24 NOTE — Assessment & Plan Note (Signed)
Spirometry 09/24/2016  FEV1 1.95 (66%)  Ratio 78  with very min curvature on no rx   Since assoc with presyncope and orthopnea but only upper airway pattern cough and no significant airflow obst while symptomatic strongly suspect this is diastolic dysfunction related to severe HBP that is life threatening at this point and told this point blank (see hbp)     I reviewed the Fletcher curve with the patient that basically indicates  if you quit smoking when your best day FEV1 is still well preserved (as is clearly  the case here)  it is highly unlikely you will progress to severe disease and informed the patient there was  no medication on the market that has proven to alter the curve/ its downward trajectory  or the likelihood of progression of their disease(unlike other chronic medical conditions such as atheroclerosis where we do think we can change the natural hx with risk reducing meds)    Therefore stopping smoking and maintaining abstinence is the most important aspect of care, not choice of inhalers or for that matter, doctors.    Pulmonary f/u is therefore prn   Total time devoted to counseling  > 50 % of initial 60 min office visit:  review case with pt/ discussion of options/alternatives/ personally creating written customized instructions  in presence of pt  then going over those specific  Instructions directly with the pt including how to use all of the meds but in particular covering each new medication in detail and the difference between the maintenance= "automatic" meds and the prns using an action plan format for the latter (If this problem/symptom => do that organization reading Left to right).  Please see AVS from this visit for a full list of these instructions which I personally wrote for this pt and  are unique to this visit.

## 2016-09-24 NOTE — Progress Notes (Signed)
Subjective:     Patient ID: Katrina [name], female   DOB: Jun 10, 1960,    MRN: [id number]  HPI   52 yowf active smoker onset breathing problems in her 28's but worse fall 2017 assoc with presyncope / syncope then seen in ER much worse acutely ill 09/06/16 also with cough so referred to pulmonary clinic 09/24/2016 by Dr   [name] with ? Copd/ab but had no evidence of airflow obst on initial w/u    09/24/2016 1st [facility] office visit/ [facility]   Chief Complaint  Patient presents with  . Pulmonary Consult    Referred by [name], NP. Pt c/o SOB "for years" for the past 6 months. She states she feels SOB "all the time" with or without any exertion. She reports she was "worn out" walking from lobby to exam room today.   sob is highly variable and not better with inhalers which she has tried ? What type s benefit  Sleeps usually at 45 degrees/ some coughing noct  Not using dexilant qhs  No obvious day to day or daytime variability or assoc excess/ purulent sputum or mucus plugs or hemoptysis or cp or chest tightness, subjective wheeze or overt sinus or hb symptoms. No unusual exp hx or h/o childhood pna/ asthma or knowledge of premature birth.  Sleeping ok without nocturnal  or early am exacerbation  of respiratory  c/o's or need for noct saba. Also denies any obvious fluctuation of symptoms with weather or environmental changes or other aggravating or alleviating factors except as outlined above   Current Medications, Allergies, Complete Past Medical History, Past Surgical History, Family History, and Social History were reviewed in [employer] record.  ROS  The following are not active complaints unless bolded sore throat, dysphagia, dental problems, itching, sneezing,  nasal congestion or excess/ purulent secretions, ear ache,   fever, chills, sweats, unintended wt loss, classically pleuritic or exertional cp,  orthopnea pnd or leg swelling, presyncope,  palpitations, abdominal pain, anorexia, nausea, vomiting, diarrhea  or change in bowel or bladder habits, change in stools or urine, dysuria,hematuria,  rash, arthralgias, visual complaints, headache, numbness, weakness or ataxia or problems with walking or coordination,  change in mood/affect or memory.           Review of Systems     Objective:   Physical Exam    honking upper airway cough    Wt Readings from Last 3 Encounters:  09/24/16 212 lb (96.2 kg)  09/08/16 212 lb (96.2 kg)  08/19/16 207 lb 8 oz (94.1 kg)    Vital signs reviewed - Note on arrival 02 sats  98% on RA- bp 288/140 p norvasc/ losartan and refuses care with risk of death explained      HEENT: nl dentition, turbinates bilaterally, and oropharynx. Nl external ear canals without cough reflex   NECK :  without JVD/Nodes/TM/ nl carotid upstrokes bilaterally   LUNGS: no acc muscle use,  Nl contour chest which is clear to A and P bilaterally without cough on insp or exp maneuvers   CV:  RRR  no s3 or murmur or increase in P2, and no edema   ABD:  Obese soft and nontender with nl inspiratory excursion in the supine position. No bruits or organomegaly appreciated, bowel sounds nl  MS:  Nl gait/ ext warm without deformities, calf tenderness, cyanosis or clubbing No obvious joint restrictions   SKIN: warm and dry without lesions    NEURO:  alert, approp,  nl sensorium with  no motor or cerebellar deficits apparent.     I personally reviewed images and agree with radiology impression as follows:  CXR:   09/08/16 No active cardiopulmonary disease.  Assessment:

## 2016-09-24 NOTE — Assessment & Plan Note (Addendum)
She has "not gotten around" to refilling her clonidine patch and I suspect that now has severe rebound but flatly refuses treatment even when informed it could be lethal and damage end organs like the brain and kidney  I strongly rec more aggressive chronic rx to include minoxidil and a more potent ARB since her renal function is nl and note has f/u with renal in a few days.  In meantime needs to go to er for worse sob/ presyncope, HA or any ams/ advised / refused my advice today  see avs for instructions unique to this ov

## 2016-09-26 IMAGING — CT CT HEAD W/O CM
1 series · 15 of 30 positions shown, 19 images · non-contrast
Comparison: MRI of the brain performed 12/25/2014, and CT of the
head performed 12/24/2014

CLINICAL DATA: Acute onset of left-sided weakness and left facial
droop. Initial encounter.

EXAM:
CT HEAD WITHOUT CONTRAST
TECHNIQUE: Contiguous axial images were obtained from the base of the skull
through the vertex without intravenous contrast.

[Series 2: headtrauma 4.8 h37s · axial · 0.43mm/px · z∈[+107,+262]mm · 15 of 36 slices shown, 19 images]
[im 2/36  brain]
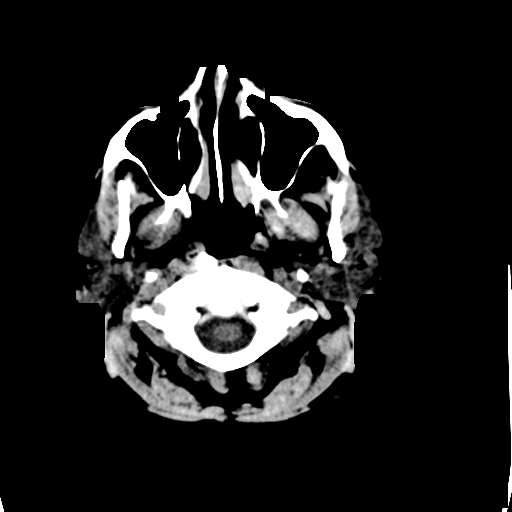
[im 2/36  bone]
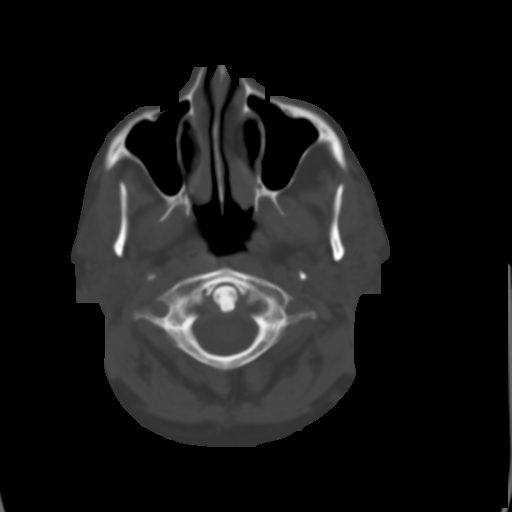
[im 4/36  brain]
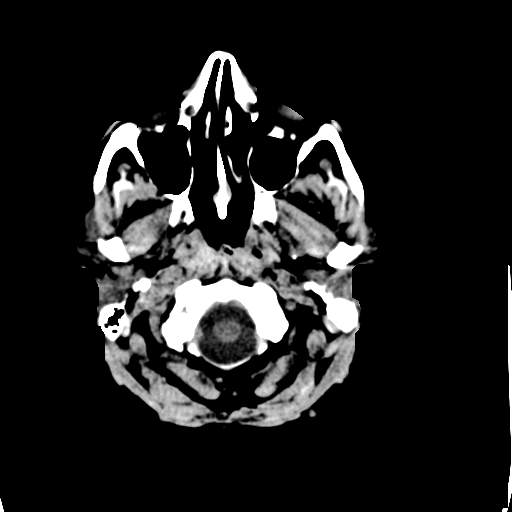
[im 7/36  brain]
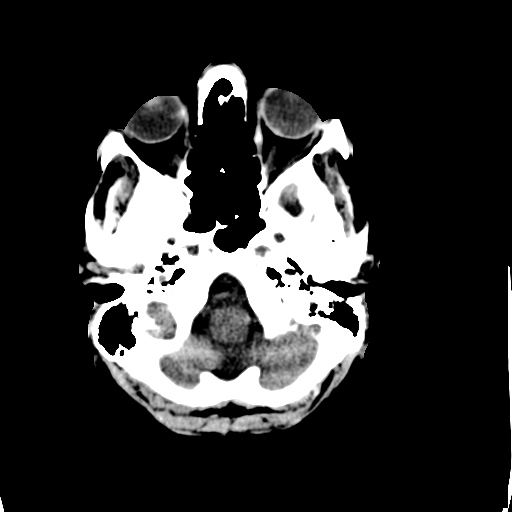
[im 9/36  brain]
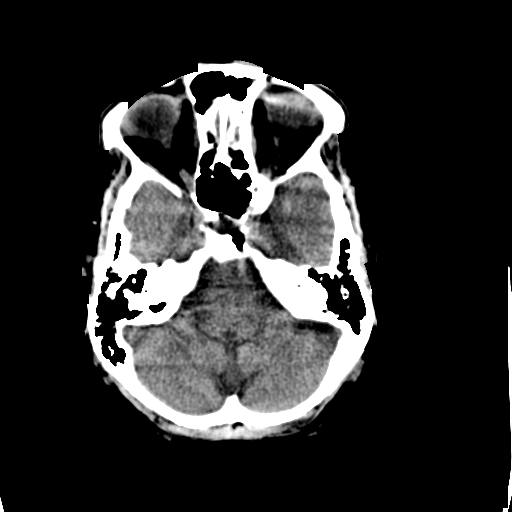
[im 11/36  brain]
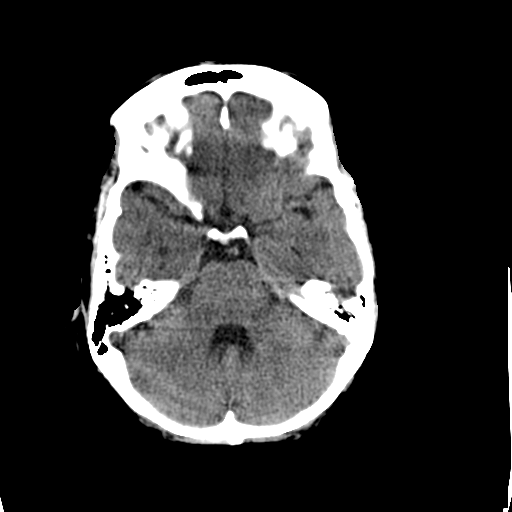
[im 11/36  bone]
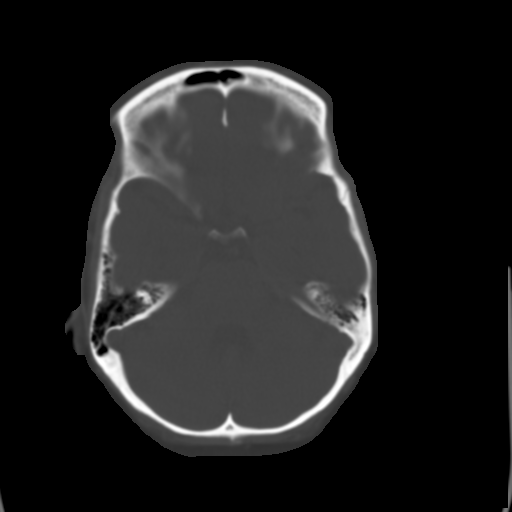
[im 14/36  brain]
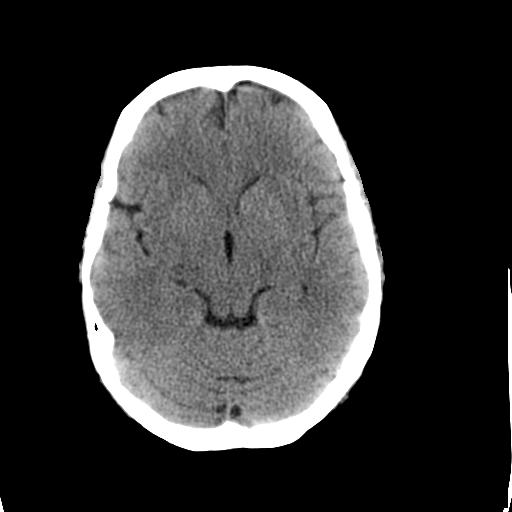
[im 16/36  brain]
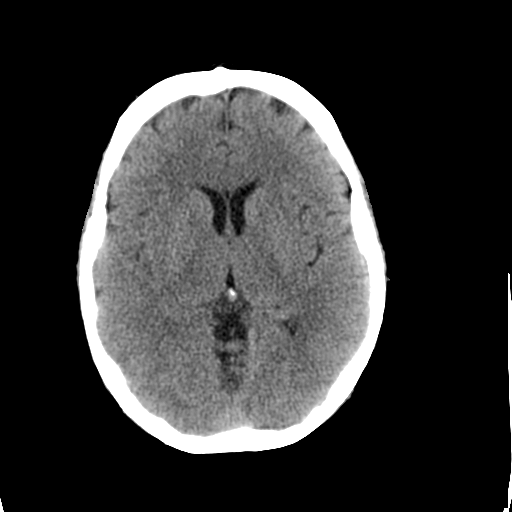
[im 19/36  brain]
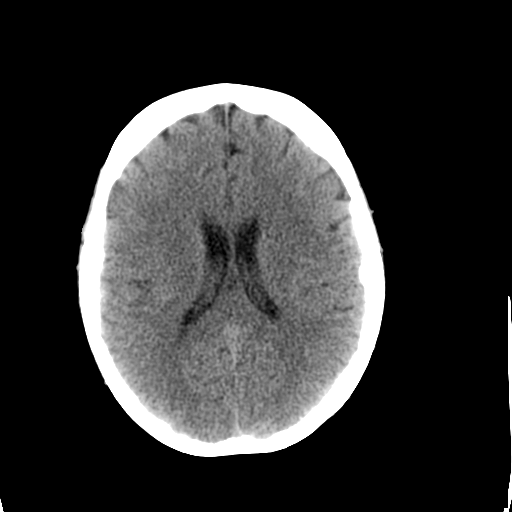
[im 20/36  brain]
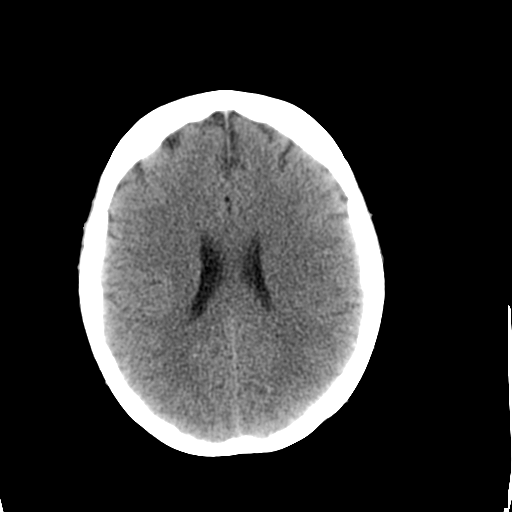
[im 20/36  bone]
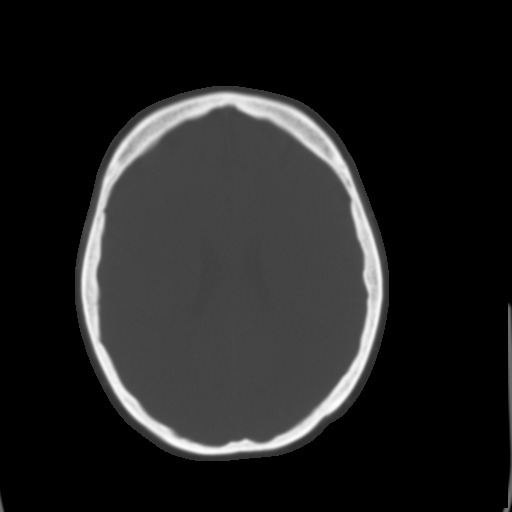
[im 22/36  brain]
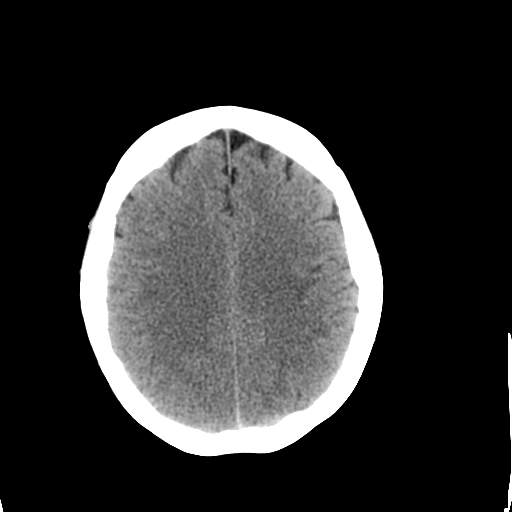
[im 25/36  brain]
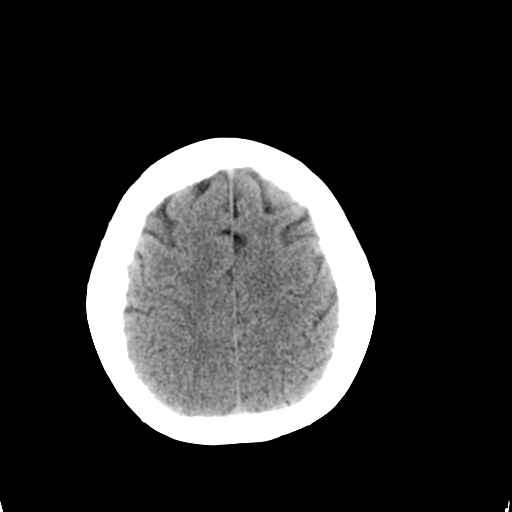
[im 27/36  brain]
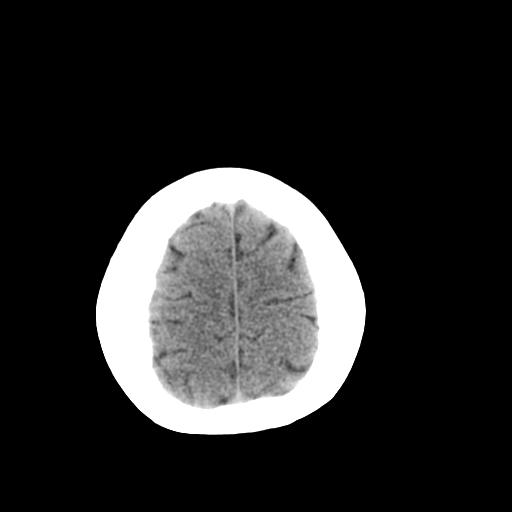
[im 29/36  brain]
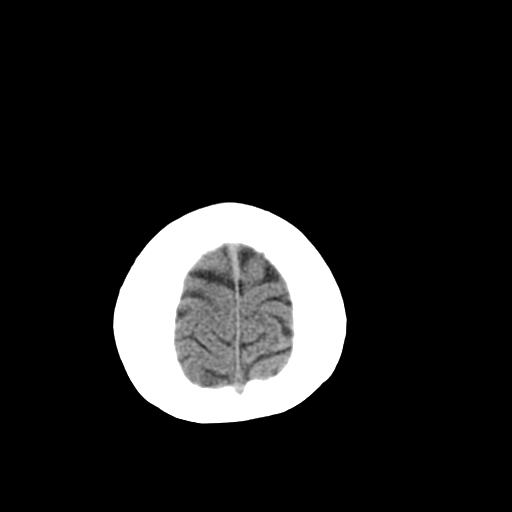
[im 29/36  bone]
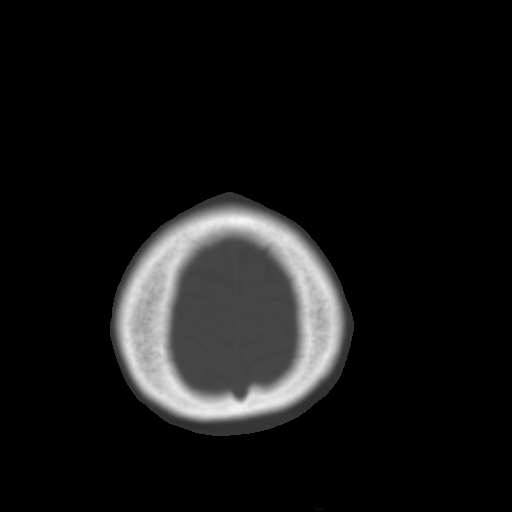
[im 32/36  brain]
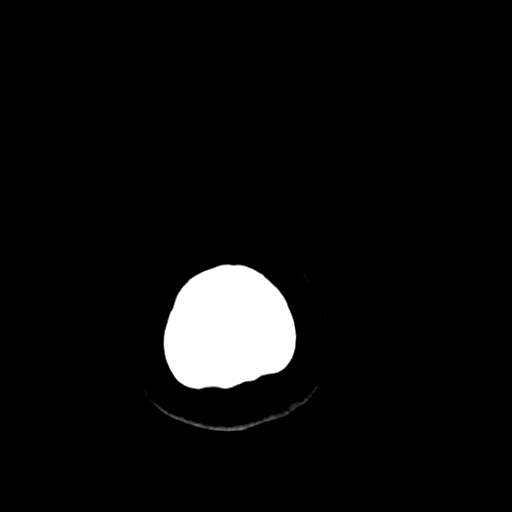
[im 34/36  brain]
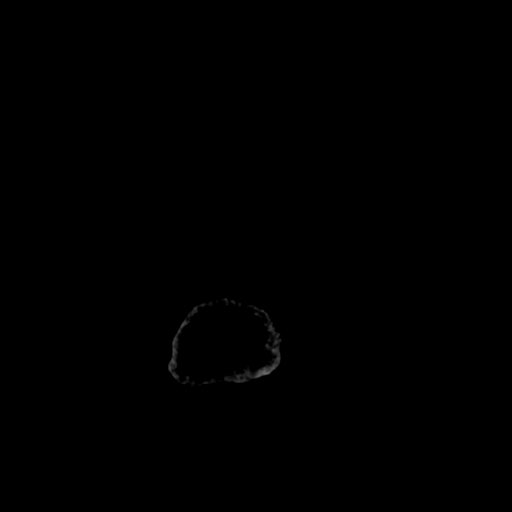

[15 of 30 positions shown; findings below may reference images not displayed]

FINDINGS: There is no evidence of acute infarction, mass lesion, or intra- or
extra-axial hemorrhage on CT.

Mild periventricular white matter change likely reflects small
vessel ischemic microangiopathy.

The posterior fossa, including the cerebellum, brainstem and fourth
ventricle, is within normal limits. The third and lateral
ventricles, and basal ganglia are unremarkable in appearance. The
cerebral hemispheres are symmetric in appearance, with normal
gray-white differentiation. No mass effect or midline shift is seen.

There is no evidence of fracture; visualized osseous structures are
unremarkable in appearance. The orbits are within normal limits. The
paranasal sinuses and mastoid air cells are well-aerated. No
significant soft tissue abnormalities are seen.
IMPRESSION: 1. No acute intracranial pathology seen on CT.
2. Mild small vessel ischemic microangiopathy.
These results were called by telephone at the time of interpretation
on 01/23/2015 at [DATE] to Dr. VALLEY PEN, who verbally
acknowledged these results.

## 2016-09-27 IMAGING — CT CT ANGIO HEAD
1 of 8 series · 6 of 47 positions shown · IV contrast (Omnipaque 300)
Comparison: Prior noncontrast head CT performed earlier on the same
day.

CLINICAL DATA: Initial evaluation for acute left-sided facial
droop, left-sided weakness.

EXAM:
CT ANGIOGRAPHY HEAD
TECHNIQUE: Multidetector CT imaging of the head was performed using the
standard protocol during bolus administration of intravenous
contrast. Multiplanar CT image reconstructions and MIPs were
obtained to evaluate the vascular anatomy.
CONTRAST:  75mL OMNIPAQUE IOHEXOL 350 MG/ML SOLN

[Series 4: brain angio 2.0 pacs · axial · 0.41mm/px · z∈[+238,+358]mm · 6 of 86 slices shown]
[im 13/86  brain]
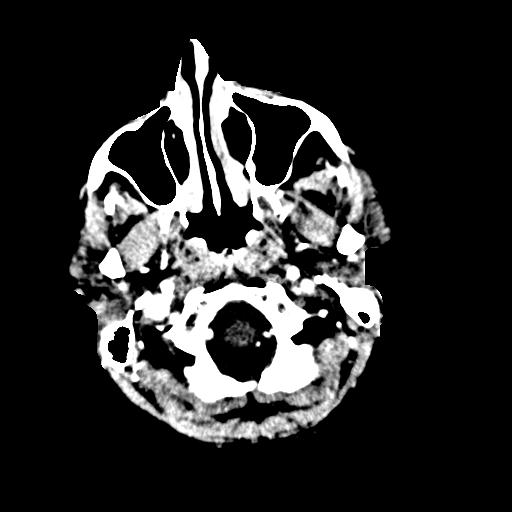
[im 25/86  bone]
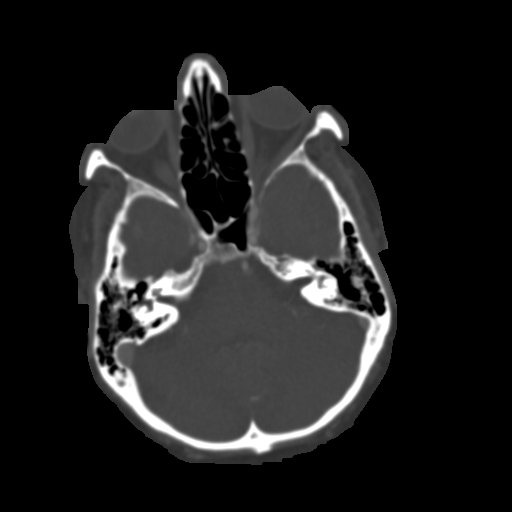
[im 37/86  brain]
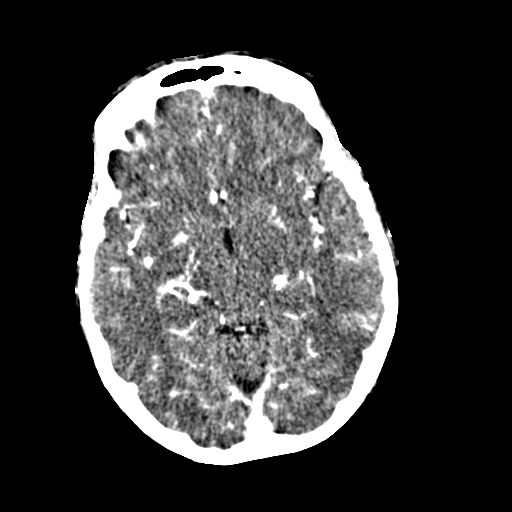
[im 49/86  bone]
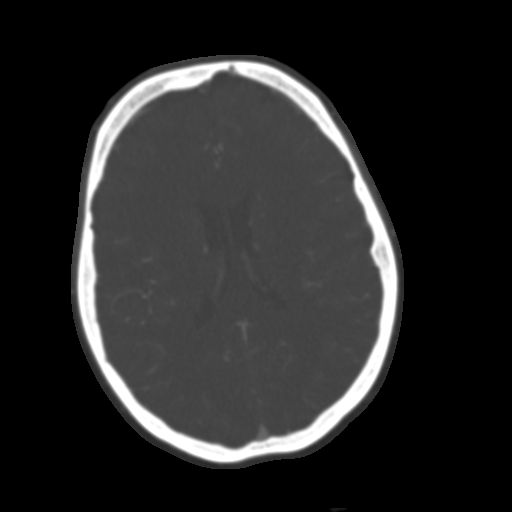
[im 61/86  brain]
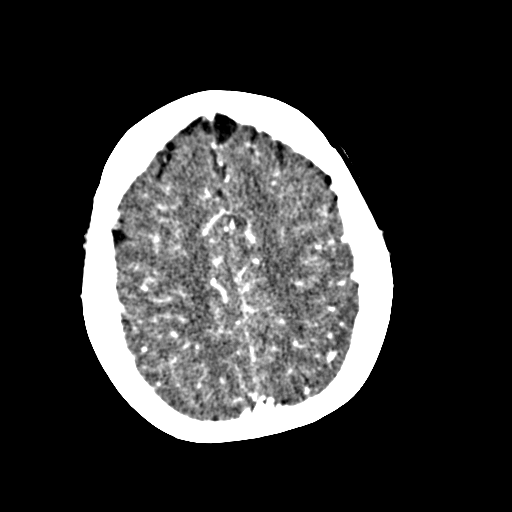
[im 73/86  bone]
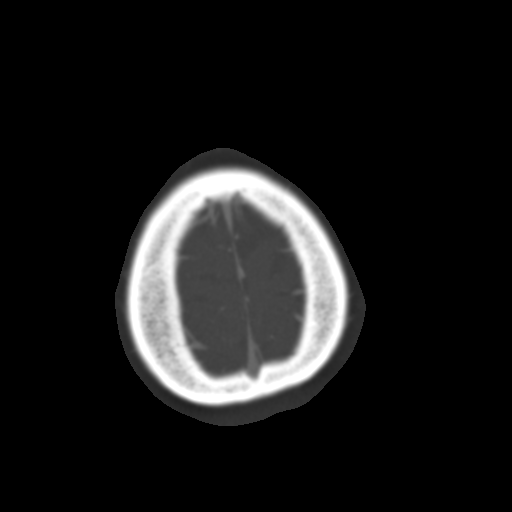

[6 of 47 positions shown; findings below may reference images not displayed]

FINDINGS: CTA HEAD

Anterior circulation: Visualized portions of the distal cervical
segments of the internal carotid arteries are widely patent
bilaterally. Petrous segments widely patent bilaterally. Scattered
atheromatous irregularity present within the cavernous segments
bilaterally without hemodynamically significant stenosis.
Supraclinoid segments widely patent. A1 segments widely patent.
Anterior communicating artery and anterior cerebral arteries well
opacified bilaterally.

M1 segments patent without stenosis or occlusion. Distal MCA
branches fairly symmetric bilaterally and well opacified.

Posterior circulation: Right vertebral artery is dominant and widely
patent to the vertebrobasilar junction. Diminutive left vertebral
artery divides at the takeoff of the left posterior inferior
cerebellar artery with a smaller hypoplastic branch ascending
towards the vertebrobasilar junction. Basilar artery widely patent.
Superior cerebellar arteries well opacified bilaterally. Both the
posterior cerebral arteries arise from the basilar artery and are
well opacified to their distal aspects.

Venous sinuses: No acute abnormality within the venous sinuses.

Anatomic variants: No anatomic variant.  No aneurysm.

Delayed phase:No abnormal enhancement on delayed sequence.
IMPRESSION: 1. Negative CTA of the head with no large or proximal arterial
branch occlusion or hemodynamically significant stenosis identified.
2. Multifocal atheromatous irregularity within the cavernous
segments of the internal carotid arteries bilaterally.

## 2016-10-14 ENCOUNTER — Encounter (HOSPITAL_COMMUNITY): Payer: Self-pay | Admitting: Emergency Medicine

## 2016-10-14 ENCOUNTER — Emergency Department (HOSPITAL_COMMUNITY)
Admission: EM | Admit: 2016-10-14 | Discharge: 2016-10-15 | Disposition: A | Discharge status: Home / Self Care | Payer: Medicare Other | Attending: Emergency Medicine | Admitting: Emergency Medicine

## 2016-10-14 ENCOUNTER — Emergency Department (HOSPITAL_COMMUNITY): Payer: Medicare Other

## 2016-10-14 ENCOUNTER — Encounter: Payer: Self-pay | Admitting: Allergy & Immunology

## 2016-10-14 ENCOUNTER — Encounter (HOSPITAL_COMMUNITY): Payer: Self-pay | Admitting: *Deleted

## 2016-10-14 ENCOUNTER — Ambulatory Visit (HOSPITAL_COMMUNITY)
Admission: EM | Admit: 2016-10-14 | Discharge: 2016-10-14 | Disposition: A | Discharge status: Home / Self Care | Payer: Medicare Other | Attending: Internal Medicine | Admitting: Internal Medicine

## 2016-10-14 ENCOUNTER — Ambulatory Visit (INDEPENDENT_AMBULATORY_CARE_PROVIDER_SITE_OTHER): Payer: Medicare Other

## 2016-10-14 ENCOUNTER — Ambulatory Visit (INDEPENDENT_AMBULATORY_CARE_PROVIDER_SITE_OTHER): Payer: Medicare Other | Admitting: Allergy & Immunology

## 2016-10-14 VITALS — BP 192/110 | HR 90 | Temp 98.7°F | Ht 66.5 in | Wt 211.8 lb

## 2016-10-14 DIAGNOSIS — J449 Chronic obstructive pulmonary disease, unspecified: Secondary | ICD-10-CM | POA: Diagnosis not present

## 2016-10-14 DIAGNOSIS — Z7982 Long term (current) use of aspirin: Secondary | ICD-10-CM | POA: Insufficient documentation

## 2016-10-14 DIAGNOSIS — K209 Esophagitis, unspecified without bleeding: Secondary | ICD-10-CM

## 2016-10-14 DIAGNOSIS — M25511 Pain in right shoulder: Secondary | ICD-10-CM

## 2016-10-14 DIAGNOSIS — R1084 Generalized abdominal pain: Secondary | ICD-10-CM

## 2016-10-14 DIAGNOSIS — R1013 Epigastric pain: Secondary | ICD-10-CM | POA: Diagnosis present

## 2016-10-14 DIAGNOSIS — T781XXA Other adverse food reactions, not elsewhere classified, initial encounter: Secondary | ICD-10-CM | POA: Insufficient documentation

## 2016-10-14 DIAGNOSIS — E860 Dehydration: Secondary | ICD-10-CM

## 2016-10-14 DIAGNOSIS — M7541 Impingement syndrome of right shoulder: Secondary | ICD-10-CM

## 2016-10-14 DIAGNOSIS — I10 Essential (primary) hypertension: Secondary | ICD-10-CM

## 2016-10-14 DIAGNOSIS — R918 Other nonspecific abnormal finding of lung field: Secondary | ICD-10-CM

## 2016-10-14 DIAGNOSIS — T781XXD Other adverse food reactions, not elsewhere classified, subsequent encounter: Secondary | ICD-10-CM | POA: Diagnosis not present

## 2016-10-14 DIAGNOSIS — F17210 Nicotine dependence, cigarettes, uncomplicated: Secondary | ICD-10-CM | POA: Diagnosis not present

## 2016-10-14 DIAGNOSIS — E119 Type 2 diabetes mellitus without complications: Secondary | ICD-10-CM | POA: Diagnosis not present

## 2016-10-14 DIAGNOSIS — T7819XA Other adverse food reactions, not elsewhere classified, initial encounter: Secondary | ICD-10-CM | POA: Insufficient documentation

## 2016-10-14 DIAGNOSIS — I2510 Atherosclerotic heart disease of native coronary artery without angina pectoris: Secondary | ICD-10-CM | POA: Insufficient documentation

## 2016-10-14 DIAGNOSIS — J3089 Other allergic rhinitis: Secondary | ICD-10-CM | POA: Diagnosis not present

## 2016-10-14 DIAGNOSIS — Z79899 Other long term (current) drug therapy: Secondary | ICD-10-CM | POA: Insufficient documentation

## 2016-10-14 LAB — COMPREHENSIVE METABOLIC PANEL
ALK PHOS: 71 U/L (ref 38–126)
ALT: 18 U/L (ref 14–54)
ANION GAP: 10 (ref 5–15)
AST: 15 U/L (ref 15–41)
Albumin: 3.4 g/dL — ABNORMAL LOW (ref 3.5–5.0)
BUN: 13 mg/dL (ref 6–20)
CALCIUM: 8.2 mg/dL — AB (ref 8.9–10.3)
CHLORIDE: 104 mmol/L (ref 101–111)
CO2: 24 mmol/L (ref 22–32)
CREATININE: 0.88 mg/dL (ref 0.44–1.00)
GFR calc non Af Amer: >60 mL/min (ref >60)
GLUCOSE: 90 mg/dL (ref 65–99)
Potassium: 3.6 mmol/L (ref 3.5–5.1)
SODIUM: 138 mmol/L (ref 135–145)
Total Bilirubin: 0.3 mg/dL (ref 0.3–1.2)
Total Protein: 6.7 g/dL (ref 6.5–8.1)

## 2016-10-14 LAB — CBC WITH DIFFERENTIAL/PLATELET
Basophils Absolute: 0 10*3/uL (ref 0.0–0.1)
Basophils Relative: 0 %
EOS ABS: 0.2 10*3/uL (ref 0.0–0.7)
EOS PCT: 2 %
HCT: 37.6 % (ref 36.0–46.0)
HEMOGLOBIN: 12.6 g/dL (ref 12.0–15.0)
LYMPHS ABS: 3.5 10*3/uL (ref 0.7–4.0)
Lymphocytes Relative: 31 %
MCH: 29.9 pg (ref 26.0–34.0)
MCHC: 33.5 g/dL (ref 30.0–36.0)
MCV: 89.3 fL (ref 78.0–100.0)
MONOS PCT: 8 %
Monocytes Absolute: 0.9 10*3/uL (ref 0.1–1.0)
Neutro Abs: 6.5 10*3/uL (ref 1.7–7.7)
Neutrophils Relative %: 59 %
PLATELETS: 287 10*3/uL (ref 150–400)
RBC: 4.21 MIL/uL (ref 3.87–5.11)
RDW: 14.6 % (ref 11.5–15.5)
WBC: 11.2 10*3/uL — ABNORMAL HIGH (ref 4.0–10.5)

## 2016-10-14 LAB — TROPONIN I

## 2016-10-14 LAB — LIPASE, BLOOD: Lipase: 16 U/L (ref 11–51)

## 2016-10-14 MED ORDER — GI COCKTAIL ~~LOC~~
30.0000 mL | Freq: Once | ORAL | Status: AC
Start: 2016-10-14 — End: 2016-10-14
  Administered 2016-10-14: 30 mL via ORAL

## 2016-10-14 MED ORDER — IOPAMIDOL (ISOVUE-300) INJECTION 61%
100.0000 mL | Freq: Once | INTRAVENOUS | Status: AC | PRN
  Administered 2016-10-14: 100 mL via INTRAVENOUS

## 2016-10-14 MED ORDER — MORPHINE SULFATE (PF) 4 MG/ML IV SOLN
4.0000 mg | INTRAVENOUS | Status: DC | PRN
  Administered 2016-10-14: 4 mg via INTRAVENOUS
  Filled 2016-10-14: qty 1

## 2016-10-14 MED ORDER — IOPAMIDOL (ISOVUE-300) INJECTION 61%
INTRAVENOUS | Status: AC
Start: 2016-10-14 — End: 2016-10-14
  Administered 2016-10-14: 100 mL
  Filled 2016-10-14: qty 30

## 2016-10-14 MED ORDER — GI COCKTAIL ~~LOC~~
ORAL | Status: AC
  Filled 2016-10-14: qty 30

## 2016-10-14 MED ORDER — SUCRALFATE 1 GM/10ML PO SUSP: 1.0000 g | Freq: Three times a day (TID) | ORAL | 0 refills | Status: DC

## 2016-10-14 MED ORDER — ONDANSETRON HCL 4 MG/2ML IJ SOLN
4.0000 mg | INTRAMUSCULAR | Status: DC | PRN
  Administered 2016-10-14: 4 mg via INTRAVENOUS
  Filled 2016-10-14: qty 2

## 2016-10-14 MED ORDER — EPINEPHRINE 0.3 MG/0.3ML IJ SOAJ: 0.3000 mg | Freq: Once | INTRAMUSCULAR | 2 refills | Status: AC

## 2016-10-14 MED ORDER — SODIUM CHLORIDE 0.9 % IV SOLN
INTRAVENOUS | Status: DC
  Administered 2016-10-14: 22:00:00 via INTRAVENOUS

## 2016-10-14 MED ORDER — BUDESONIDE-FORMOTEROL FUMARATE 160-4.5 MCG/ACT IN AERO: 2.0000 | INHALATION_SPRAY | Freq: Two times a day (BID) | RESPIRATORY_TRACT | 5 refills | Status: DC

## 2016-10-14 MED ORDER — FAMOTIDINE IN NACL 20-0.9 MG/50ML-% IV SOLN
20.0000 mg | Freq: Once | INTRAVENOUS | Status: AC
  Administered 2016-10-14: 20 mg via INTRAVENOUS
  Filled 2016-10-14: qty 50

## 2016-10-14 NOTE — ED Provider Notes (Signed)
CSN: [id number]     Arrival date & time 10/14/16  1600 History   First MD Initiated Contact with Patient 10/14/16 1757     Chief Complaint  Patient presents with  . Arm Pain  . Abdominal Pain   (Consider location/radiation/quality/duration/timing/severity/associated sxs/prior Treatment) 56 year old female presents to clinic with 2 chief complaints, the first is epigastric pain, ongoing for 3 days, states the pain is intense, worsens with movement, unable to eat or drink, as this worsens the pain. She does not have a gallbladder, and she reports that her appendix has been removed. She does report she had a fever yesterday. She's had no suspicious food intake, did have diarrhea on the first 2 days, 5-6 episodes per day, states that it is reduced today.  Has one week of right shoulder and arm pain, states she was rushing between trains at the [place], tried to lift her suitcase, and has had pain since, she is unable to raise her shoulder above her head, and has pain when anything touches her shoulder.    Arm Pain  Associated symptoms include abdominal pain.  Abdominal Pain  Associated symptoms: chills, diarrhea, fatigue and nausea   Associated symptoms: no vomiting     Past Medical History:  Diagnosis Date  . Anginal pain ([place])   . Anxiety    takes Valium daily as needed  . Asthma    has inhalers but doesn't use  . CAD (coronary artery disease)    Moderate nonobstructive 2012-2013, [place]; No angiographic evidence of CAD 01/31/15 [facility]  . Chronic back pain    budlging disc   . COPD (chronic obstructive pulmonary disease) ([place])   . Diverticulitis   . Family history of adverse reaction to anesthesia    daughter gets extremely sick   . GERD (gastroesophageal reflux disease)    takes Dexilant daily  . History of blood transfusion 1975/1976   no abnormal reaction noted  . History of bronchitis 06/2015  . History of colitis   . History of colon polyps    benign  . History of gastric  ulcer   . History of migraine    last one a few days ago  . History of MRSA infection   . Hyperlipidemia    was on meds but has been off over a yr  . Hypertension    takes Amlodipine and Maxzide daily  . Insomnia    takes Ambien nightly  . Lung nodules   . Microscopic hematuria   . MS (multiple sclerosis) ([place])    questionable per pt  . Peripheral neuropathy    weakness,numbness,and tingling. Takes Gabapentin daily  . Pneumonia 06/2015  . Restless leg syndrome    takes Requip daily  . Stroke [facility])    takes Plavix daily.Left sided weakness   Past Surgical History:  Procedure Laterality Date  . ABDOMINAL HYSTERECTOMY    . ANKLE SURGERY Bilateral   . APPENDECTOMY    . BACK SURGERY     fusion  . CARDIAC CATHETERIZATION N/A 01/31/2015   Procedure: Left Heart Cath and Coronary Angiography;  Surgeon: Burnell Blanks, MD;  Location: [facility];  Service: Cardiovascular;  Laterality: N/A;  . CHOLECYSTECTOMY    . COLONOSCOPY    . ESOPHAGOGASTRODUODENOSCOPY    . NOSE SURGERY    . RADIOLOGY WITH ANESTHESIA N/A 01/15/2016   Procedure: MRI LUMBAR SPINE WITHOUT;  Surgeon: Medication Radiologist, MD;  Location: [facility];  Service: Radiology;  Laterality: N/A;  . RADIOLOGY WITH  ANESTHESIA N/A 06/29/2016   Procedure: MRI OF THE BRAIN WITH AND WITHOUT;  Surgeon: Medication Radiologist, MD;  Location: [facility];  Service: Radiology;  Laterality: N/A;  . TONSILLECTOMY    . 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 Coronary artery disease Father   . Emphysema Father   . Heart attack Father   . Stroke Father   . Cancer Father        Unsure of type   . Allergic rhinitis Father   . Depression Mother   . Bipolar disorder Brother   . Drug abuse Brother   . Bipolar disorder Daughter   . Allergic rhinitis Daughter   . Asthma Daughter   . Urticaria Daughter   . Diabetes Maternal Grandmother   .  Angioedema Neg Hx   . Atopy Neg Hx   . Eczema Neg Hx   . Immunodeficiency Neg Hx    Social History  Substance Use Topics  . Smoking status: Current Every Day Smoker    Packs/day: 2.00    Years: 44.00    Types: Cigarettes    Start date: 03/04/1971  . Smokeless tobacco: Never Used     Comment: Vapor Cigs  . Alcohol use No     Comment: 10-06-15 per pt no    OB History    No data available     Review of Systems  Constitutional: Positive for chills and fatigue.  Respiratory: Negative.   Gastrointestinal: Positive for abdominal pain, diarrhea and nausea. Negative for vomiting.  Musculoskeletal: Positive for joint swelling and myalgias.  Skin: Negative.   Neurological: Negative.     Allergies  Other; Penicillins; Zithromax [azithromycin]; Aspirin; Pineapple; Strawberry extract; Wheat bran; Yeast-related products; Aspartame and phenylalanine; Mushroom extract complex; and Nicardipine  Home Medications   Prior to Admission medications   Medication Sig Start Date End Date Taking? Authorizing Provider  albuterol (PROVENTIL HFA;VENTOLIN HFA) 108 (90 Base) MCG/ACT inhaler Inhale 1 puff into the lungs every 4 (four) hours as needed for wheezing.    [provider]  albuterol (PROVENTIL) (2.5 MG/3ML) 0.083% nebulizer solution Take 3 mLs by nebulization daily as needed for shortness of breath. 07/23/16   [provider]  amLODipine (NORVASC) 10 MG tablet Take 1 tablet (10 mg total) by mouth daily. 06/05/14   Herminio Commons, MD  aspirin-acetaminophen-caffeine (EXCEDRIN MIGRAINE) [phone] MG tablet Take 2 tablets by mouth every 6 (six) hours as needed for migraine.    [provider]  budesonide-formoterol (SYMBICORT) 160-4.5 MCG/ACT inhaler Inhale 2 puffs into the lungs 2 (two) times daily. 10/14/16   Valentina Shaggy, MD  clopidogrel (PLAVIX) 75 MG tablet Take 75 mg by mouth daily.    [provider]  dexlansoprazole (DEXILANT) 60 MG capsule Take  1 capsule (60 mg total) by mouth every evening. Patient taking differently: Take 60 mg by mouth daily.  03/20/14   Rexene Alberts, MD  diazepam (VALIUM) 5 MG tablet Take 1 tablet (5 mg total) by mouth every 8 (eight) hours as needed for anxiety. Patient taking differently: Take 5 mg by mouth daily as needed for anxiety.  05/04/16   Orvan Falconer, MD  EPINEPHrine (AUVI-Q) 0.3 mg/0.3 mL IJ SOAJ injection Inject 0.3 mLs (0.3 mg total) into the muscle once. 10/14/16 10/14/16  Valentina Shaggy, MD  gabapentin (NEURONTIN) 600 MG tablet Take 600 mg by mouth 4 (  four) times daily.    [provider]  losartan (COZAAR) 50 MG tablet Take 50 mg by mouth 2 (two) times daily.  06/09/16   [provider]  methocarbamol (ROBAXIN) 750 MG tablet Take 750 mg by mouth 2 (two) times daily as needed for muscle spasms. 05/30/16   [provider]  oxyCODONE-acetaminophen (PERCOCET) 7.5-325 MG tablet Take 1 tablet by mouth every 6 (six) hours as needed (for pain.).  03/25/16   [provider]  rOPINIRole (REQUIP) 4 MG tablet Take 4 mg by mouth at bedtime.    [provider]  zolpidem (AMBIEN) 10 MG tablet Take 10 mg by mouth at bedtime as needed for sleep.  03/25/16   [provider]   Meds Ordered and Administered this Visit   Medications  gi cocktail (Maalox,Lidocaine,Donnatal) (30 mLs Oral Given 10/14/16 1816)    BP (!) 183/84 (BP Location: Left Arm)   Pulse 83   Temp 99.4 F (37.4 C)   Resp 17   Ht 5' 6.5" (1.689 m)   Wt 211 lb (95.7 kg)   LMP  (Exact Date)   SpO2 99%   BMI 33.55 kg/m  No data found.   Physical Exam  Constitutional: She is oriented to person, place, and time. She appears well-developed and well-nourished. No distress.  HENT:  Head: Normocephalic.  Right Ear: External ear normal.  Left Ear: External ear normal.  Eyes: Conjunctivae are normal.  Neck: Normal range of motion.  Cardiovascular: Normal rate and regular rhythm.    Pulmonary/Chest: Effort normal and breath sounds normal.  Abdominal: Soft. Normal appearance and bowel sounds are normal. She exhibits no mass. There is no hepatosplenomegaly. There is tenderness in the epigastric area and left upper quadrant. There is no rebound and no CVA tenderness.  Musculoskeletal:       Right shoulder: She exhibits decreased range of motion, tenderness, swelling and pain. She exhibits no deformity and normal pulse.  Extension limited to 30, flexion to 60 rotation, to 10, unable to abduct.  Neurological: She is alert and oriented to person, place, and time.  Skin: Skin is warm and dry. Capillary refill takes less than 2 seconds. She is not diaphoretic.  Psychiatric: She has a normal mood and affect. Her behavior is normal.  Nursing note and vitals reviewed.   Urgent Care Course     Procedures (including critical care time)  Labs Review Labs Reviewed - No data to display  Imaging Review Dg Shoulder Right  Result Date: 10/14/2016 CLINICAL DATA:  Right shoulder pain. Pain a glenohumeral joint. Limited rotation and flexion. No known injury. EXAM: RIGHT SHOULDER - 2+ VIEW COMPARISON:  Right shoulder radiographs 06/23/2015 FINDINGS: There is no evidence of fracture or dislocation. Normal glenohumeral alignment. There is no evidence of arthropathy or other focal bone abnormality. Soft tissues are unremarkable. No abnormal soft tissue calcifications. IMPRESSION: Negative radiographs of the right shoulder. Electronically Signed   By: Jeb Levering M.D.   On: 10/14/2016 18:59       MDM   1. Shoulder impingement syndrome, right   2. Dehydration   3. Generalized abdominal pain    Shoulder pain, offered injection, however patient declined. Declined to offer NSAIDs due to concern of kidney function, and past history of ulcers along with GI bleeding.  Dehydration, and generalized abdominal pain, patient failed oral therapy in clinic, states she is not had to urinate  since yesterday. Recommend going to the emergency room for further evaluation.  Barnet Glasgow, NP 10/14/16 1944

## 2016-10-14 NOTE — ED Notes (Signed)
Pt has been adv to go to [facility] ED, NPO... Pt verb understanding.

## 2016-10-14 NOTE — ED Triage Notes (Signed)
Pt. Stated, I started having rt. Arm pain lifted back pack about a week ago and I stared having stomach pain a couple of days ago.

## 2016-10-14 NOTE — ED Triage Notes (Signed)
Pt was seen at urgent care today and told to come to ED due to her abdominal pain and BP

## 2016-10-14 NOTE — Progress Notes (Signed)
FOLLOW UP  Date of Service/Encounter:  10/14/16   Assessment:   Moderate COPD  Pulmonary nodules (last chest CT February 2018)  Chronic allergic rhinitis ([place] grass, tree, molds, cats, dust mite)  Adverse food reaction (multiple foods)  Multiple antibiotic allergies (penicillin, azithromycin)  Hypertension - resistant to medications evidently  On blood thinners (Plavix)    Asthma Reportables:  Severity: moderate persistent  Risk: low Control: well controlled   Plan/Recommendations:   1. 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 - I think that you would benefit from a daily controller medication for your asthma: Symbicort two puffs twice daily with a spacer. - This will help control both your inflammation in your lungs and keep your lungs open for an extended period of time. - Her smoking is certainly not helping her symptoms either, and I did encourage smoking cessation.  - We may consider full pulmonary function testing at the next appointment if there is no improvement with her spirometry.  - We will strongly consider a pulmonology referral at the next visit for more appropriate management.  - Daily controller medication(s): Symbicort 160/4.5 two puffs twice daily with spacer - Rescue medications: ProAir 4 puffs every 4-6 hours as needed - Asthma control goals:  * Full participation in all desired activities (may need albuterol before activity) * Albuterol use two time or less a week on average (not counting use with activity) * Cough interfering with sleep two time or less a month * Oral steroids no more than once a year * No hospitalizations   2. Chronic allergic rhinitis (dust mites, cat, molds) - We will get blood work today to look for evidence of other allergies. - This information, combined with the skin testing from the last  visit, will provide information for making allergy injections. - Allergy shot consent signed today. - Make an appointment to start allergy shots in 3-4 weeks in [place].  3. Adverse food reaction (strawberries, [place], pineapples, mushrooms, wheat, yeast) - Continue to avoid all triggers. - We will defer testing at this time since it would not change our management.  - We are sending in a new [place] today.  4. Return in about 2 months (around 12/14/2016).   Subjective:   Katrina [name] is a 56 y.o. female presenting today for follow up of  Chief Complaint  Patient presents with  . Allergy Testing    requesting allergy testing-food and environmental.     Katrina [name] has a history of the following: Patient Active Problem List   Diagnosis Date Noted  . Cigarette smoker 09/24/2016  . Cerebral microvascular disease 08/19/2016  . Non compliance with medical treatment 05/11/2016  . Leg pain, anterior, right 05/11/2016  . Syncope 05/02/2016  . Type 2 diabetes mellitus with diabetic neuropathy ([place]) 05/02/2016  . Restless leg syndrome 05/02/2016  . Cerebrovascular accident (CVA) ([place])   . Vascular headache   . History of chest pain   . Generalized anxiety disorder   . Chronic bilateral low back pain without sciatica   . Peripheral neuropathy   . Migraine without status migrainosus, not intractable   . PTSD (post-traumatic stress disorder)   . History of cerebrovascular accident (CVA) with residual deficit   . CVA (cerebral vascular accident) ([place]) 04/03/2016  . Stroke (cerebrum) ([place]) 04/03/2016  . Post traumatic stress disorder 10/06/2015  . Precordial pain   . Migraine  syndrome 01/24/2015  . Migraine 01/24/2015  . TIA (transient ischemic attack) 12/25/2014  . Hypertensive urgency   . Other chest pain   . Malignant hypertension   . Hypokalemia 04/16/2014  . Muscle weakness (generalized) 04/05/2014  . Stiffness of left hip joint 04/05/2014  . Pain in left hip 04/05/2014    . Diastolic dysfunction 67/34[birth year]1937  . Normal coronary arteries 03/20/2014  . PUD (peptic ulcer disease) 03/20/2014  . Type 2 diabetes, uncontrolled, with neuropathy ([place]) 03/20/2014  . Morbid obesity due to excess calories ([place]) 03/20/2014  . Left-sided weakness 03/20/2014  . Paresthesias 03/20/2014  . Cerebrovascular disease 03/20/2014  . Dyspnea on exertion 03/05/2012  . Tobacco abuse 03/05/2012  . COPD (chronic obstructive pulmonary disease) ([place]) 11/23/2011  . GERD (gastroesophageal reflux disease) 11/23/2011  . Uncontrolled hypertension 11/23/2011    History obtained from: chart review and patient.  Katrina [name] was referred by Berkley Harvey, NP.     Katrina [name] is a 56 y.o. female presenting for a follow up visit. She was last seen in April 2016 by Dr. Ishmael Holter, who has since left the practice. At that time, she had testing that was positive to dust mite,cat, and molds. She was started on nasal saline rinses two sprays per nostril BID. She was also started on Qvar 80 g 2022 puffs at night for her asthma. At that time, she did have reversibility with albuterol treatment. She had a limited food allergy testing performed which was negative to the most common food allergies, pork, [place], beef, strawberry, yeast, barley, sesame seed, and pineapple.  Since the last visit, she has not done very well. She did remain on all of her medications until they ran out, and then never requested refills. She continues to have mucus production and sneezing throughout the year, although there isringing time worsening of symptoms. She has runny nose in the evenings. She is on absolutely no medications. She has a history of multiple nasal surgeries when she was in her teens, and is not supposed to use any nasal sprays because these might break down the artificial nose. She does not have a local ENT. All of her surgeries were performed in [place] at [facility]. She is treated  with antibiotics around 4 times per year to treat sinusitis and bronchitis. In the remote history, she was on allergy shots which she says helped her tremendously. She is interested in starting these once again.   From an asthma standpoint, she is on absolutely no daily inhalers. She does have a ProAir at home as well as a albuterol nebulizer. She estimates that she needs prednisone around 4 times per year for her breathing, which often occurs in the setting of bronchitis. She has been hospitalized at least twice in the last year for her asthma, including one hospitalization for bilateral pneumonia per the patient. There are notes in the system that note a diagnosis of COPD, with a DLCO in the past that has been around 53%.   Katrina [name] does have GERD and is on Dexilant. She has a history of a penicillin (33 years ago) and azithromycin (in the last ten years), with anaphylaxis to both. She does have itchiness to doxycycline without other reactions. She is not interested in repeat testing and challenge at all.  She has been on anti-hypertensive since the 1980s with no improvement with medications. Per the patient she has been told that this is "just normal" for her. However according to the latest  Cardiology note, she is non-compliant with medications. From a cardiac standpoint she has normal coronaries by cath Aug 2016 and had an echocardiogram in Oct 2017 that showed normal LVF. She has seen a cardiologist, last seen [place]. She has been referred to [facility] for an appointment, but has not made one at this time.. She has had two strokes, although according to PA Kilroy's note, these have been evaluated but never diagnosed by routine means. She is Plavix as a prophylaxis.   Otherwise, there have been no changes to her past medical history, surgical history, family history, or social history.  Chest CT (February 2018):  FINDINGS: Cardiovascular: Somewhat limited due to lack of IV contrast.  Aortic calcifications are seen without aneurysmal dilatation. No significant cardiac enlargement is seen. No coronary calcifications are noted.  Mediastinum/Nodes: The thoracic inlet is within normal limits. A small exophytic nodule is noted arising from the inferior aspect of the thyroid isthmus measuring approximately 15 mm. No significant hilar or mediastinal adenopathy is noted. The esophagus as visualize is within normal limits.  Lungs/Pleura: The lungs are well aerated bilaterally without focal infiltrate or sizable effusion. A 4 mm left lower lobe nodule is noted which corresponds to that seen on the prior exam. This is likely stable given some technical variation in the films with thinner slice thickness. A 5 mm subpleural nodule is noted in the right middle lobe best seen on image number 101 of series 4. This was not as well visualized on the prior exam again due to the variation in slice thickness. No other sizable nodules are seen.  Upper Abdomen: Cholecystectomy changes are noted. No other focal abnormality is seen.  Musculoskeletal: No significant bony abnormality is noted.  IMPRESSION: Bilateral pulmonary nodules as described above. There are low both likely stable from the prior exam given some variation in the technical imaging. No follow-up needed if patient is low-risk (and has no known or suspected primary neoplasm). Non-contrast chest CT can be considered in 12 months if patient is high-risk. This recommendation follows the consensus statement: Guidelines for Management of Incidental Pulmonary Nodules Detected on CT Images: From the Fleischner Society 2017; Radiology 2017; 284:228-243.    Review of Systems: a 14-point review of systems is pertinent for what is mentioned in HPI.  Otherwise, all other systems were negative. Constitutional: negative other than that listed in the HPI Eyes: negative other than that listed in the HPI Ears, nose, mouth,  throat, and face: negative other than that listed in the HPI Respiratory: negative other than that listed in the HPI Cardiovascular: negative other than that listed in the HPI Gastrointestinal: negative other than that listed in the HPI Genitourinary: negative other than that listed in the HPI Integument: negative other than that listed in the HPI Hematologic: negative other than that listed in the HPI Musculoskeletal: negative other than that listed in the HPI Neurological: negative other than that listed in the HPI Allergy/Immunologic: negative other than that listed in the HPI    Objective:   Blood pressure (!) 192/110, pulse 90, temperature 98.7 F (37.1 C), height 5' 6.5" (1.689 m), weight 211 lb 12.8 oz (96.1 kg), SpO2 95 %. Body mass index is 33.67 kg/m.   Physical Exam:  General: Alert, interactive, in no acute distress. Light headedness.  Eyes: No conjunctival injection present on the right, No conjunctival injection present on the left, PERRL bilaterally, No discharge on the right, No discharge on the left and No Horner-Trantas dots present Ears:  Right TM pearly gray with normal light reflex, Left TM pearly gray with normal light reflex, Right TM intact without perforation and Left TM intact without perforation.  Nose/Throat: External nose within normal limits and septum midline, turbinates edematous and pale with clear discharge, post-pharynx erythematous with cobblestoning in the posterior oropharynx. Tonsils 2+ without exudates Neck: Supple without thyromegaly. Lungs: Decreased breath sounds bilaterally without wheezing, rhonchi or rales. No increased work of breathing. CV: Normal S1/S2, no murmurs. Capillary refill <2 seconds.  Skin: Warm and dry, without lesions or rashes. Neuro:   Grossly intact. No focal deficits appreciated. Responsive to questions.   Diagnostic studies:  Spirometry: results abnormal (FEV1: 1.63/61%, FVC: 2.13/64%, FEV1/FVC: 76%).    Spirometry  consistent with moderate restrictive disease. Albuterol/Atrovent nebulizer treatment given in clinic with significant improvement only in the FEF 25-75%. She did feel clinically improved, however.   Allergy Studies: none (patient was skin tested in 2016)     Salvatore Marvel, MD [facility]

## 2016-10-14 NOTE — Discharge Instructions (Signed)
Based on your signs and symptoms, along with dehydration, and inability to tolerate oral meds, I recommend you go to the ER for further evaluation of your condition.

## 2016-10-14 NOTE — Patient Instructions (Addendum)
1. Mild persistent asthma, uncomplicated - Lung testing today was abnormal, but you did respond to albuterol. - I think that you would benefit from a daily controller medication for your asthma: Symbicort two puffs twice daily with a spacer. - This will help control both your inflammation in your lungs and keep your lungs open for an extended period of time. - Daily controller medication(s): Symbicort 160/4.5 two puffs twice daily with spacer - Rescue medications: ProAir 4 puffs every 4-6 hours as needed - Asthma control goals:  * Full participation in all desired activities (may need albuterol before activity) * Albuterol use two time or less a week on average (not counting use with activity) * Cough interfering with sleep two time or less a month * Oral steroids no more than once a year * No hospitalizations   2. Chronic allergic rhinitis (dust mites, cat, molds) - We will get blood work today to look for evidence of other allergies. - This information, combined with the skin testing from the last visit, will provide information for making allergy injections. - Allergy shot consent signed today. - Make an appointment to start allergy shots in 3-4 weeks in [place].  3. Adverse food reaction (strawberries, [place], pineapples, mushrooms, wheat, yeast) - Continue to avoid all triggers. - We will defer testing at this time since it would not change our management.  - We are sending in a new [place] today.  4. Return in about 2 months (around 12/14/2016).  Please inform us of any Emergency Department visits, hospitalizations, or changes in symptoms. Call us before going to the ED for breathing or allergy symptoms since we might be able to fit you in for a sick visit. Feel free to contact us anytime with any questions, problems, or concerns.  It was a pleasure to meet you today! Happy spring!   Websites that have reliable patient information: 1. American Academy of Asthma, Allergy, and  Immunology: www.aaaai.org 2. Food Allergy Research and Education (FARE): foodallergy.org 3. Mothers of Asthmatics: http://www.asthmacommunitynetwork.org 4. American College of Allergy, Asthma, and Immunology: www.acaai.org

## 2016-10-14 NOTE — Discharge Instructions (Signed)
Eat a bland diet, avoiding greasy, fatty, fried foods, as well as spicy and acidic foods or beverages.  Avoid eating within the hour or 2 before going to bed or laying down.  Also avoid teas, colas, coffee, chocolate, pepermint and spearment. Take the prescription as directed.  Call your regular medical doctor tomorrow to schedule a follow up appointment in the next 2 days. Call the GI doctor tomorrow to schedule a follow up appointment within the next week.  Return to the Emergency Department immediately if worsening.

## 2016-10-14 NOTE — ED Provider Notes (Signed)
[facility] DEPT Provider Note   CSN: [id number] Arrival date & time: 10/14/16  2020     History   Chief Complaint Chief Complaint  Patient presents with  . Abdominal Pain    HPI Katrina [name] is a 56 y.o. female.  HPI  Pt was seen at 2100.  Per pt, c/o gradual onset and persistence of constant generalized abd "pain" for the past 3 days.  Has been associated with multiple intermittent episodes of N/V/D.  Describes the abd pain as located mostly across her upper abd. Max home temps to "100.2." Pt was evaluated at a local [facility], then sent to the ED for further evaluation.  Denies back pain, no rash, no CP/SOB, no black or blood in stools or emesis.      Past Medical History:  Diagnosis Date  . Anginal pain ([place])   . Anxiety    takes Valium daily as needed  . Asthma    has inhalers but doesn't use  . CAD (coronary artery disease)    Moderate nonobstructive 2012-2013, [place]; No angiographic evidence of CAD 01/31/15 [facility]  . Chronic back pain    budlging disc   . COPD (chronic obstructive pulmonary disease) ([place])   . Diverticulitis   . Family history of adverse reaction to anesthesia    daughter gets extremely sick   . GERD (gastroesophageal reflux disease)    takes Dexilant daily  . History of blood transfusion 1975/1976   no abnormal reaction noted  . History of bronchitis 06/2015  . History of colitis   . History of colon polyps    benign  . History of gastric ulcer   . History of migraine    last one a few days ago  . History of MRSA infection   . Hyperlipidemia    was on meds but has been off over a yr  . Hypertension    takes Amlodipine and Maxzide daily  . Insomnia    takes Ambien nightly  . Lung nodules   . Microscopic hematuria   . MS (multiple sclerosis) ([place])    questionable per pt  . Peripheral neuropathy    weakness,numbness,and tingling. Takes Gabapentin daily  . Pneumonia 06/2015  . Restless leg syndrome    takes Requip daily  . Stroke  [facility])    takes Plavix daily.Left sided weakness    Patient Active Problem List   Diagnosis Date Noted  . Adverse food reaction 10/14/2016  . Pulmonary nodules 10/14/2016  . Non-seasonal allergic rhinitis due to fungal spores 10/14/2016  . Cigarette smoker 09/24/2016  . Cerebral microvascular disease 08/19/2016  . Non compliance with medical treatment 05/11/2016  . Leg pain, anterior, right 05/11/2016  . Syncope 05/02/2016  . Type 2 diabetes mellitus with diabetic neuropathy ([place]) 05/02/2016  . Restless leg syndrome 05/02/2016  . Cerebrovascular accident (CVA) ([place])   . Vascular headache   . History of chest pain   . Generalized anxiety disorder   . Chronic bilateral low back pain without sciatica   . Peripheral neuropathy   . Migraine without status migrainosus, not intractable   . PTSD (post-traumatic stress disorder)   . History of cerebrovascular accident (CVA) with residual deficit   . CVA (cerebral vascular accident) ([place]) 04/03/2016  . Stroke (cerebrum) ([place]) 04/03/2016  . Post traumatic stress disorder 10/06/2015  . Precordial pain   . Migraine syndrome 01/24/2015  . Migraine 01/24/2015  . TIA (transient ischemic attack) 12/25/2014  . Hypertensive urgency   . Other  chest pain   . Malignant hypertension   . Hypokalemia 04/16/2014  . Muscle weakness (generalized) 04/05/2014  . Stiffness of left hip joint 04/05/2014  . Pain in left hip 04/05/2014  . Diastolic dysfunction 28/76/8115  . Normal coronary arteries 03/20/2014  . PUD (peptic ulcer disease) 03/20/2014  . Type 2 diabetes, uncontrolled, with neuropathy ([place]) 03/20/2014  . Morbid obesity due to excess calories ([place]) 03/20/2014  . Left-sided weakness 03/20/2014  . Paresthesias 03/20/2014  . Cerebrovascular disease 03/20/2014  . Dyspnea on exertion 03/05/2012  . Tobacco abuse 03/05/2012  . Moderate COPD (chronic obstructive pulmonary disease) ([place]) 11/23/2011  . GERD (gastroesophageal reflux disease)  11/23/2011  . Uncontrolled hypertension 11/23/2011    Past Surgical History:  Procedure Laterality Date  . ABDOMINAL HYSTERECTOMY    . ANKLE SURGERY Bilateral   . APPENDECTOMY    . BACK SURGERY     fusion  . CARDIAC CATHETERIZATION N/A 01/31/2015   Procedure: Left Heart Cath and Coronary Angiography;  Surgeon: Burnell Blanks, MD;  Location: [facility];  Service: Cardiovascular;  Laterality: N/A;  . CHOLECYSTECTOMY    . COLONOSCOPY    . ESOPHAGOGASTRODUODENOSCOPY    . NOSE SURGERY    . RADIOLOGY WITH ANESTHESIA N/A 01/15/2016   Procedure: MRI LUMBAR SPINE WITHOUT;  Surgeon: Medication Radiologist, MD;  Location: [facility];  Service: Radiology;  Laterality: N/A;  . RADIOLOGY WITH ANESTHESIA N/A 06/29/2016   Procedure: MRI OF THE BRAIN WITH AND WITHOUT;  Surgeon: Medication Radiologist, MD;  Location: [facility];  Service: Radiology;  Laterality: N/A;  . TONSILLECTOMY    . 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 tumor removed from left breast      OB History    No data available       Home Medications    Prior to Admission medications   Medication Sig Start Date End Date Taking? Authorizing Provider  albuterol (PROVENTIL HFA;VENTOLIN HFA) 108 (90 Base) MCG/ACT inhaler Inhale 1 puff into the lungs every 4 (four) hours as needed for wheezing.    [provider]  albuterol (PROVENTIL) (2.5 MG/3ML) 0.083% nebulizer solution Take 3 mLs by nebulization daily as needed for shortness of breath. 07/23/16   [provider]  amLODipine (NORVASC) 10 MG tablet Take 1 tablet (10 mg total) by mouth daily. 06/05/14   Herminio Commons, MD  aspirin-acetaminophen-caffeine (EXCEDRIN MIGRAINE) [phone] MG tablet Take 2 tablets by mouth every 6 (six) hours as needed for migraine.    [provider]  budesonide-formoterol (SYMBICORT) 160-4.5 MCG/ACT inhaler Inhale 2 puffs into the lungs 2 (two) times daily. 10/14/16   Valentina Shaggy, MD  clopidogrel (PLAVIX) 75  MG tablet Take 75 mg by mouth daily.    [provider]  dexlansoprazole (DEXILANT) 60 MG capsule Take 1 capsule (60 mg total) by mouth every evening. Patient taking differently: Take 60 mg by mouth daily.  03/20/14   Rexene Alberts, MD  diazepam (VALIUM) 5 MG tablet Take 1 tablet (5 mg total) by mouth every 8 (eight) hours as needed for anxiety. Patient taking differently: Take 5 mg by mouth daily as needed for anxiety.  05/04/16   Orvan Falconer, MD  EPINEPHrine (AUVI-Q) 0.3 mg/0.3 mL IJ SOAJ injection Inject 0.3 mLs (0.3 mg total) into the muscle once. 10/14/16 10/14/16  Valentina Shaggy, MD  gabapentin (NEURONTIN) 600 MG tablet Take 600 mg by mouth 4 (four) times daily.    [provider]  losartan (  COZAAR) 50 MG tablet Take 50 mg by mouth 2 (two) times daily.  06/09/16   [provider]  methocarbamol (ROBAXIN) 750 MG tablet Take 750 mg by mouth 2 (two) times daily as needed for muscle spasms. 05/30/16   [provider]  oxyCODONE-acetaminophen (PERCOCET) 7.5-325 MG tablet Take 1 tablet by mouth every 6 (six) hours as needed (for pain.).  03/25/16   [provider]  rOPINIRole (REQUIP) 4 MG tablet Take 4 mg by mouth at bedtime.    [provider]  zolpidem (AMBIEN) 10 MG tablet Take 10 mg by mouth at bedtime as needed for sleep.  03/25/16   [provider]    Family History Family History  Problem Relation Age of Onset  . Coronary artery disease Father   . Emphysema Father   . Heart attack Father   . Stroke Father   . Cancer Father        Unsure of type   . Allergic rhinitis Father   . Depression Mother   . Bipolar disorder Brother   . Drug abuse Brother   . Bipolar disorder Daughter   . Allergic rhinitis Daughter   . Asthma Daughter   . Urticaria Daughter   . Diabetes Maternal Grandmother   . Angioedema Neg Hx   . Atopy Neg Hx   . Eczema Neg Hx   . Immunodeficiency Neg Hx     Social History Social History    Substance Use Topics  . Smoking status: Current Every Day Smoker    Packs/day: 2.00    Years: 44.00    Types: Cigarettes    Start date: 03/04/1971  . Smokeless tobacco: Never Used     Comment: Vapor Cigs  . Alcohol use No     Comment: 10-06-15 per pt no      Allergies   Other; Penicillins; Zithromax [azithromycin]; Aspirin; Pineapple; Strawberry extract; Wheat bran; Yeast-related products; Aspartame and phenylalanine; Mushroom extract complex; and Nicardipine   Review of Systems Review of Systems ROS: Statement: All systems negative except as marked or noted in the HPI; Constitutional: Negative for fever and chills. ; ; Eyes: Negative for eye pain, redness and discharge. ; ; ENMT: Negative for ear pain, hoarseness, nasal congestion, sinus pressure and sore throat. ; ; Cardiovascular: Negative for chest pain, palpitations, diaphoresis, dyspnea and peripheral edema. ; ; Respiratory: Negative for cough, wheezing and stridor. ; ; Gastrointestinal: +N/V/D, abd pain. Negative for blood in stool, hematemesis, jaundice and rectal bleeding. . ; ; Genitourinary: Negative for dysuria, flank pain and hematuria. ; ; Musculoskeletal: Negative for back pain and neck pain. Negative for swelling and trauma.; ; Skin: Negative for pruritus, rash, abrasions, blisters, bruising and skin lesion.; ; Neuro: Negative for headache, lightheadedness and neck stiffness. Negative for weakness, altered level of consciousness, altered mental status, extremity weakness, paresthesias, involuntary movement, seizure and syncope.       Physical Exam Updated Vital Signs BP (!) 196/87 (BP Location: Left Arm)   Pulse 88   Temp 100.2 F (37.9 C) (Oral)   Resp 20   Ht 5' 6.5" (1.689 m)   Wt 212 lb (96.2 kg)   BMI 33.71 kg/m    Patient Vitals for the past 24 hrs:  BP Temp Temp src Pulse Resp SpO2 Height Weight  10/14/16 2156 (!) 159/96 99.4 F (37.4 C) - 86 (!) 25 96 % - -  10/14/16 2031 (!) 196/87 100.2 F (37.9 C)  Oral 88 20 - 5' 6.5" (1.689  m) 212 lb (96.2 kg)     Physical Exam 2105: Physical examination:  Nursing notes reviewed; Vital signs and O2 SAT reviewed;  Constitutional: Well developed, Well nourished, Well hydrated, In no acute distress; Head:  Normocephalic, atraumatic; Eyes: EOMI, PERRL, No scleral icterus; ENMT: Mouth and pharynx normal, Mucous membranes moist; Neck: Supple, Full range of motion, No lymphadenopathy; Cardiovascular: Regular rate and rhythm, No gallop; Respiratory: Breath sounds clear & equal bilaterally, No wheezes.  Speaking full sentences with ease, Normal respiratory effort/excursion; Chest: Nontender, Movement normal; Abdomen: Soft, +RUQ, mid-epigastric, LUQ > LLQ and RLQ tenderness to palp. No rebound or guarding. Nondistended, Normal bowel sounds; Genitourinary: No CVA tenderness; Extremities: Pulses normal, No tenderness, No edema, No calf edema or asymmetry.; Neuro: AA&Ox3, Major CN grossly intact.  Speech clear. No gross focal motor or sensory deficits in extremities. Climbs on and off stretcher easily by herself. Gait steady.; Skin: Color normal, Warm, Dry.   ED Treatments / Results  Labs (all labs ordered are listed, but only abnormal results are displayed)   EKG  EKG Interpretation  Date/Time:  Thursday Oct 14 2016 21:54:43 EDT Ventricular Rate:  90 PR Interval:    QRS Duration: 95 QT Interval:  374 QTC Calculation: 458 R Axis:   60 Text Interpretation:  Sinus rhythm When compared with ECG of 09/08/2016 No significant change was found Confirmed by [facility]  MD, Nunzio Cory [phone]) on 10/14/2016 10:03:26 PM       Radiology   Procedures Procedures (including critical care time)  Medications Ordered in ED Medications  0.9 %  sodium chloride infusion (not administered)  morphine 4 MG/ML injection 4 mg (not administered)  ondansetron (ZOFRAN) injection 4 mg (not administered)  famotidine (PEPCID) IVPB 20 mg premix (not administered)  iopamidol (ISOVUE-300)  61 % injection (not administered)     Initial Impression / Assessment and Plan / ED Course  I have reviewed the triage vital signs and the nursing notes.  Pertinent labs & imaging results that were available during my care of the patient were reviewed by me and considered in my medical decision making (see chart for details).  MDM Reviewed: previous chart, nursing note and vitals Reviewed previous: labs and ECG Interpretation: labs, ECG, x-ray and CT scan   Results for orders placed or performed during the hospital encounter of 10/14/16  CBC with Differential  Result Value Ref Range   WBC 11.2 (H) 4.0 - 10.5 K/uL   RBC 4.21 3.87 - 5.11 MIL/uL   Hemoglobin 12.6 12.0 - 15.0 g/dL   HCT 37.6 36.0 - 46.0 %   MCV 89.3 78.0 - 100.0 fL   MCH 29.9 26.0 - 34.0 pg   MCHC 33.5 30.0 - 36.0 g/dL   RDW 14.6 11.5 - 15.5 %   Platelets 287 150 - 400 K/uL   Neutrophils Relative % 59 %   Neutro Abs 6.5 1.7 - 7.7 K/uL   Lymphocytes Relative 31 %   Lymphs Abs 3.5 0.7 - 4.0 K/uL   Monocytes Relative 8 %   Monocytes Absolute 0.9 0.1 - 1.0 K/uL   Eosinophils Relative 2 %   Eosinophils Absolute 0.2 0.0 - 0.7 K/uL   Basophils Relative 0 %   Basophils Absolute 0.0 0.0 - 0.1 K/uL  Comprehensive metabolic panel  Result Value Ref Range   Sodium 138 135 - 145 mmol/L   Potassium 3.6 3.5 - 5.1 mmol/L   Chloride 104 101 - 111 mmol/L   CO2 24 22 - 32 mmol/L  Glucose, Bld 90 65 - 99 mg/dL   BUN 13 6 - 20 mg/dL   Creatinine, Ser 0.88 0.44 - 1.00 mg/dL   Calcium 8.2 (L) 8.9 - 10.3 mg/dL   Total Protein 6.7 6.5 - 8.1 g/dL   Albumin 3.4 (L) 3.5 - 5.0 g/dL   AST 15 15 - 41 U/L   ALT 18 14 - 54 U/L   Alkaline Phosphatase 71 38 - 126 U/L   Total Bilirubin 0.3 0.3 - 1.2 mg/dL   GFR calc non Af Amer >60 >60 mL/min   GFR calc Af Amer >60 >60 mL/min   Anion gap 10 5 - 15  Lipase, blood  Result Value Ref Range   Lipase 16 11 - 51 U/L  Troponin I  Result Value Ref Range   Troponin I <0.03 <0.03 ng/mL    Dg Chest 2 View Result Date: 10/14/2016 CLINICAL DATA:  Upper abdominal pain. Nausea, vomiting and diarrhea. Right arm pain. EXAM: CHEST  2 VIEW COMPARISON:  Radiographs 09/08/2016, CT 08/03/2016 FINDINGS: Normal heart size and mediastinal contours. Streaky atelectasis in the lingula and right middle lobe. No pulmonary edema, confluent airspace disease, pleural fluid or pneumothorax. No acute osseous abnormalities are seen. IMPRESSION: Streaky atelectasis in the lingula and right middle lobe. Electronically Signed   By: Jeb Levering M.D.   On: 10/14/2016 22:55   Ct Abdomen Pelvis W Contrast Result Date: 10/14/2016 CLINICAL DATA:  Acute onset of epigastric abdominal pain and fever. Initial encounter. EXAM: CT ABDOMEN AND PELVIS WITH CONTRAST TECHNIQUE: Multidetector CT imaging of the abdomen and pelvis was performed using the standard protocol following bolus administration of intravenous contrast. CONTRAST:  130mL ISOVUE-300 IOPAMIDOL (ISOVUE-300) INJECTION 61% COMPARISON:  Renal ultrasound performed 09/03/2016, and CT of the abdomen and pelvis performed 09/11/2015 FINDINGS: Lower chest: Mild bibasilar atelectasis or scarring is noted. A tiny 3 mm nodule at the left lung base is stable from 2017 and benign. The visualized portions of the mediastinum are unremarkable. Vague soft tissue edema is noted adjacent to the distal esophagus. Endoscopy would be helpful to exclude underlying ulceration or esophagitis. Hepatobiliary: The liver is unremarkable in appearance. Intrahepatic biliary ductal dilatation reflects prior cholecystectomy. Clips are noted at the gallbladder fossa. The common bile duct remains normal in caliber. Pancreas: The pancreas is within normal limits. Spleen: The spleen is unremarkable in appearance. Adrenals/Urinary Tract: The adrenal glands are unremarkable in appearance. The kidneys are within normal limits. There is no evidence of hydronephrosis. No renal or ureteral stones are  identified. No perinephric stranding is seen. Stomach/Bowel: The stomach is unremarkable in appearance. The small bowel is within normal limits. The patient is status post appendectomy. The colon is unremarkable in appearance. Vascular/Lymphatic: Scattered calcification is seen along the abdominal aorta and its branches. The abdominal aorta is otherwise grossly unremarkable. The inferior vena cava is grossly unremarkable. No retroperitoneal lymphadenopathy is seen. No pelvic sidewall lymphadenopathy is identified. Reproductive: The bladder is mildly distended and grossly unremarkable. The patient is status post hysterectomy. No suspicious adnexal masses are seen. Other: No additional soft tissue abnormalities are seen. Musculoskeletal: No acute osseous abnormalities are identified. Lumbosacral spinal fusion hardware is noted at L5-S1. The visualized musculature is unremarkable in appearance. IMPRESSION: 1. Vague soft tissue edema noted adjacent to the distal esophagus. Would correlate with the patient's symptoms. Endoscopy would be helpful to exclude underlying ulceration or esophagitis. 2. Scattered aortic atherosclerosis. 3. Mild atelectasis or scarring noted at the lung bases. Electronically Signed   By:  Garald Balding M.D.   On: 10/14/2016 23:27    2335:  UA pending. Workup otherwise reassuring.  Sign out to Dr. Betsey Holiday.    Final Clinical Impressions(s) / ED Diagnoses   Final diagnoses:  None    New Prescriptions New Prescriptions   No medications on file     Francine Graven, DO 10/14/16 2337

## 2016-10-15 ENCOUNTER — Other Ambulatory Visit: Payer: Self-pay

## 2016-10-15 DIAGNOSIS — K209 Esophagitis, unspecified: Secondary | ICD-10-CM | POA: Diagnosis not present

## 2016-10-15 LAB — URINALYSIS, ROUTINE W REFLEX MICROSCOPIC
BACTERIA UA: NONE SEEN
Bilirubin Urine: NEGATIVE
GLUCOSE, UA: NEGATIVE mg/dL
Ketones, ur: NEGATIVE mg/dL
Leukocytes, UA: NEGATIVE
NITRITE: NEGATIVE
PH: 6 (ref 5.0–8.0)
PROTEIN: 30 mg/dL — AB
Specific Gravity, Urine: 1.033 — ABNORMAL HIGH (ref 1.005–1.030)

## 2016-10-15 MED ORDER — EPINEPHRINE 0.3 MG/0.3ML IJ SOAJ: 0.3000 mg | Freq: Once | INTRAMUSCULAR | 2 refills | Status: AC

## 2016-10-15 NOTE — Telephone Encounter (Signed)
Resent epipen to [facility] on [address]. per pt request.

## 2016-10-16 LAB — URINE CULTURE

## 2016-10-18 ENCOUNTER — Ambulatory Visit (INDEPENDENT_AMBULATORY_CARE_PROVIDER_SITE_OTHER): Payer: Medicare Other | Admitting: Physician Assistant

## 2016-10-18 ENCOUNTER — Encounter: Payer: Self-pay | Admitting: *Deleted

## 2016-10-18 ENCOUNTER — Encounter: Payer: Self-pay | Admitting: Physician Assistant

## 2016-10-18 VITALS — BP 198/100 | HR 80 | Ht 66.5 in | Wt 211.0 lb

## 2016-10-18 DIAGNOSIS — R110 Nausea: Secondary | ICD-10-CM | POA: Diagnosis not present

## 2016-10-18 DIAGNOSIS — K219 Gastro-esophageal reflux disease without esophagitis: Secondary | ICD-10-CM

## 2016-10-18 DIAGNOSIS — R1310 Dysphagia, unspecified: Secondary | ICD-10-CM

## 2016-10-18 DIAGNOSIS — R1319 Other dysphagia: Secondary | ICD-10-CM

## 2016-10-18 MED ORDER — DEXLANSOPRAZOLE 60 MG PO CPDR: DELAYED_RELEASE_CAPSULE | ORAL | 1 refills | Status: AC

## 2016-10-18 MED ORDER — ONDANSETRON 4 MG PO TBDP: ORAL_TABLET | ORAL | 0 refills | Status: DC

## 2016-10-18 MED ORDER — LIDOCAINE VISCOUS 2 % MT SOLN: OROMUCOSAL | 1 refills | Status: DC

## 2016-10-18 NOTE — Progress Notes (Signed)
Subjective:    Patient ID: Katrina [name], female    DOB: May 06, 1961, 56 y.o.   MRN: [id number]  HPI Katrina [name]  is a 56 year old white female, new to GI today referred by [name] NP for evaluation of dysphagia and odynophagia. Patient has history of chronic GERD and has been maintained on DEXA want over the past few years. She reports previous GI workup in [facility] with endoscopy and colonoscopy both done in 2013. She says she was told she had reflux and erosions in her stomach from taking NSAIDs. Colonoscopy showed diverticulosis and polyps were removed. She says she was told to follow-up in about 5 years. Patient does not know the name of that practice and says that physician has retired. No family history of colon cancer. Patient has history of CVA 2 and is maintained on Plavix, uncontrolled hypertension, COPD-no oxygen use, migraines, anxiety disorder, PTSD, adult-onset diabetes mellitus and neuropathy. She is status post appendectomy and cholecystectomy, hysterectomy. Patient states her current symptoms started about 2 weeks ago with dysphagia and odynophagia. She says she felt as if she had a sore throat initially and had some swelling of her glands in her throat. Since that time she's had a very difficult time eating and drinking and says that she can feel food and liquids going all the way down her esophagus. When something hits a certain spot in her lower chest she says it's quite painful. She's also been having frequent vomiting and regurgitation. She says she tries to get food and medications down and they "come right back up". She was seen in the emergency room on 10/14/2016 at that time complaining of some nausea vomiting diarrhea and had a low-grade fever of 100.2. Labs were done showing WBC of 10.2 hemoglobin 12.6 chemistries unremarkable. CT of the abdomen and pelvis was done showing mild bibasilar atelectasis or scarring, vague soft tissue edema noted adjacent to the  distal esophagus, status post cholecystectomy, no ductal dilation, and scattered aortic atherosclerosis. Patient had been given some Mycostatin through another ER in [place] but says she's been unable to keep that down. She has not taken her Plavix over the past week, and also has not been taking some of her other medications. She says she is taking the Dexilant. She had not been started on any new medications, says she rarely uses her inhalers, no recent antibiotics, does have a "cold sore" on her lip but does not have any oral lesions.  Review of Systems Pertinent positive and negative review of systems were noted in the above HPI section.  All other review of systems was otherwise negative.  Outpatient Encounter Prescriptions as of 10/18/2016  Medication Sig  . albuterol (PROVENTIL HFA;VENTOLIN HFA) 108 (90 Base) MCG/ACT inhaler Inhale 1 puff into the lungs every 4 (four) hours as needed for wheezing.  Marland Kitchen albuterol (PROVENTIL) (2.5 MG/3ML) 0.083% nebulizer solution Take 3 mLs by nebulization daily as needed for shortness of breath.  Marland Kitchen amLODipine (NORVASC) 10 MG tablet Take 1 tablet (10 mg total) by mouth daily.  Marland Kitchen aspirin-acetaminophen-caffeine (EXCEDRIN MIGRAINE) 250-250-65 MG tablet Take 2 tablets by mouth every 6 (six) hours as needed for migraine.  . chlorthalidone (HYGROTON) 25 MG tablet Take 25 mg by mouth daily. Take 1 tablet by mouth 2 times daily  . cloNIDine (CATAPRES - DOSED IN MG/24 HR) 0.3 mg/24hr patch Place 0.3 mg onto the skin once a week. Apply 1 patch to the skin weekly.  . clopidogrel (PLAVIX) 75 MG tablet  Take 75 mg by mouth daily.  Marland Kitchen dexlansoprazole (DEXILANT) 60 MG capsule Take 1 tab by mouth every morning.  . diazepam (VALIUM) 5 MG tablet Take 1 tablet (5 mg total) by mouth every 8 (eight) hours as needed for anxiety. (Patient taking differently: Take 5 mg by mouth daily as needed for anxiety. )  . gabapentin (NEURONTIN) 600 MG tablet Take 600 mg by mouth 4 (four)  times daily.  Marland Kitchen losartan (COZAAR) 50 MG tablet Take 50 mg by mouth 2 (two) times daily.   . methocarbamol (ROBAXIN) 750 MG tablet Take 750 mg by mouth 2 (two) times daily as needed for muscle spasms.  Marland Kitchen oxyCODONE-acetaminophen (PERCOCET) 7.5-325 MG tablet Take 1 tablet by mouth every 6 (six) hours as needed (for pain.).   Marland Kitchen pregabalin (LYRICA) 75 MG capsule Take 75 mg by mouth 2 (two) times daily.  Marland Kitchen rOPINIRole (REQUIP) 4 MG tablet Take 4 mg by mouth at bedtime.  . Suvorexant (BELSOMRA) 10 MG TABS Take 10 mg by mouth. Take 1 tab within 30 minutes of going to bed.  . umeclidinium-vilanterol (ANORO ELLIPTA) 62.5-25 MCG/INH AEPB Inhale 1 puff into the lungs daily. Inhale 1 puff daily.  Marland Kitchen zolpidem (AMBIEN) 10 MG tablet Take 10 mg by mouth at bedtime as needed for sleep.   . [DISCONTINUED] budesonide-formoterol (SYMBICORT) 160-4.5 MCG/ACT inhaler Inhale 2 puffs into the lungs 2 (two) times daily.  . [DISCONTINUED] dexlansoprazole (DEXILANT) 60 MG capsule Take 1 capsule (60 mg total) by mouth every evening. (Patient taking differently: Take 60 mg by mouth daily. )  . [DISCONTINUED] sucralfate (CARAFATE) 1 GM/10ML suspension Take 10 mLs (1 g total) by mouth 4 (four) times daily -  with meals and at bedtime.  . lidocaine (XYLOCAINE) 2 % solution Take 5 cc's by mouth 15 minutes before a meal.  . ondansetron (ZOFRAN ODT) 4 MG disintegrating tablet Dissolve 1 tablet on the tongue every 6 hours as needed for nausea.   No facility-administered encounter medications on file as of 10/18/2016.    Allergies  Allergen Reactions  . Other Anaphylaxis and Swelling    [place]  . Penicillins Anaphylaxis    Has patient had a PCN reaction causing immediate rash, facial/tongue/throat swelling, SOB or lightheadedness with hypotension: Yes Has patient had a PCN reaction causing severe rash involving mucus membranes or skin necrosis: Yes Has patient had a PCN reaction that required hospitalization Yes Has patient had a  PCN reaction occurring within the last 10 years: No If all of the above answers are "NO", then may proceed with Cephalosporin use.   . Zithromax [Azithromycin] Anaphylaxis  . Aspirin Other (See Comments)    Due to stomach ulcers.   . Pineapple Rash  . Strawberry Extract Rash  . Wheat Bran Other (See Comments)    Sneezing, rhinorrhea  . Yeast-Related Products Other (See Comments)    Sneezing, rhinorrhea  . Aspartame And Phenylalanine Palpitations  . Mushroom Extract Complex Rash  . Nicardipine Nausea And Vomiting and Other (See Comments)    shaking   Patient Active Problem List   Diagnosis Date Noted  . Adverse food reaction 10/14/2016  . Pulmonary nodules 10/14/2016  . Non-seasonal allergic rhinitis due to fungal spores 10/14/2016  . Cigarette smoker 09/24/2016  . Cerebral microvascular disease 08/19/2016  . Non compliance with medical treatment 05/11/2016  . Leg pain, anterior, right 05/11/2016  . Syncope 05/02/2016  . Type 2 diabetes mellitus with diabetic neuropathy ([place]) 05/02/2016  . Restless leg syndrome 05/02/2016  .  Cerebrovascular accident (CVA) ([place])   . Vascular headache   . History of chest pain   . Generalized anxiety disorder   . Chronic bilateral low back pain without sciatica   . Peripheral neuropathy   . Migraine without status migrainosus, not intractable   . PTSD (post-traumatic stress disorder)   . History of cerebrovascular accident (CVA) with residual deficit   . CVA (cerebral vascular accident) ([place]) 04/03/2016  . Stroke (cerebrum) ([place]) 04/03/2016  . Post traumatic stress disorder 10/06/2015  . Precordial pain   . Migraine syndrome 01/24/2015  . Migraine 01/24/2015  . TIA (transient ischemic attack) 12/25/2014  . Hypertensive urgency   . Other chest pain   . Malignant hypertension   . Hypokalemia 04/16/2014  . Muscle weakness (generalized) 04/05/2014  . Stiffness of left hip joint 04/05/2014  . Pain in left hip 04/05/2014  . Diastolic  dysfunction 75/64/3329  . Normal coronary arteries 03/20/2014  . PUD (peptic ulcer disease) 03/20/2014  . Type 2 diabetes, uncontrolled, with neuropathy ([place]) 03/20/2014  . Morbid obesity due to excess calories ([place]) 03/20/2014  . Left-sided weakness 03/20/2014  . Paresthesias 03/20/2014  . Cerebrovascular disease 03/20/2014  . Dyspnea on exertion 03/05/2012  . Tobacco abuse 03/05/2012  . Moderate COPD (chronic obstructive pulmonary disease) ([place]) 11/23/2011  . GERD (gastroesophageal reflux disease) 11/23/2011  . Uncontrolled hypertension 11/23/2011   Social History   Social History  . Marital status: Divorced    Spouse name: N/A  . Number of children: 3  . Years of education: Some college   Occupational History  .      Disability for back pain   Social History Main Topics  . Smoking status: Current Every Day Smoker    Packs/day: 2.00    Years: 44.00    Types: Cigarettes    Start date: 03/04/1971  . Smokeless tobacco: Never Used     Comment: Vapor Cigs  . Alcohol use No     Comment: 10-06-15 per pt no   . Drug use: No     Comment: 10-06-15 per pt no  . Sexual activity: Not Currently    Birth control/ protection: Surgical   Other Topics Concern  . Not on file   Social History Narrative   Lives at home with her daughter.   Right-handed.   1-2 cups coffee in the morning and 2 sodas per day.       Ms. Yousuf family history includes Allergic rhinitis in her daughter and father; Asthma in her daughter; Bipolar disorder in her brother and daughter; Cancer in her father; Coronary artery disease in her father; Depression in her mother; Diabetes in her maternal grandmother; Drug abuse in her brother; Emphysema in her father; Heart attack in her father; Stomach cancer in her maternal grandfather; Stroke in her father; Urticaria in her daughter.      Objective:    Vitals:   10/18/16 1357  BP: (!) 198/100  Pulse: 80    Physical Exam  well-developed white female in no  acute distress, pleasant blood pressure 198/100, pulse 80, height 5 foot 6, weight 211, BMI of 33.5. HEENT; nontraumatic normocephalic EOMI PERRLA sclera anicteric, Oropharynx clear, Cardiovascular; regular rate and rhythm with S1-S2 no murmur rub or gallop, Pulmonary; clear bilaterally, Abdomen ;soft, mild tenderness in the epigastrium no guarding or rebound no palpable mass or hepatosplenomegaly bowel sounds are present, Rectal ;exam not done, Extremities; no clubbing cyanosis or edema skin warm and dry, Neuropsych ;mood and affect appropriate  Assessment & Plan:   #44 56 year old white female with history of chronic GERD, on chronic Dexilant  to 60 mg daily with 2 week history of dysphagia and odynophagia, and regurgitation CT scan done 10/14/2016 showing vague soft tissue edema adjacent to the distal esophagus Rule out ulcerative esophagitis, reflux induced versus infectious i.e. candidiasis or herpetic #2 COPD #3 history of CVA 2-on chronic Plavix but has not taken over the past week secondary to above symptoms #4 history of colon polyps type unclear, probable adenomatous as was told to follow-up in 5 years with last colonoscopy 2013 done in [place], [place] #5 uncontrolled hypertension chronic #6 migraine headaches #7 status post appendectomy cholecystectomy and hysterectomy  Plan; continue Dexilant 60 mg by mouth daily Start Zofran ODT 4 mg every 6 hours around the clock short-term, and have asked patient to push clear to full liquids Viscous lidocaine 5 mL -20 minutes before meals Patient will be scheduled for EGD with Dr. Havery Moros on 10/20/2016. Procedure discussed in detail with the patient including risks and benefits and she is agreeable to proceed. She had self discontinued her Plavix about one week ago and is advised to stay off until post EGD Further recommendations pending findings at endoscopy She will also need to be set up for colonoscopy which can be done  after her acute issues have resolved.  Amy Genia Harold PA-C 10/18/2016   Cc: Berkley Harvey, NP

## 2016-10-18 NOTE — Patient Instructions (Signed)
You have been scheduled for an endoscopy. Please follow written instructions given to you at your visit today. If you use inhalers (even only as needed), please bring them with you on the day of your procedure. Your physician has requested that you go to www.startemmi.com and enter the access code given to you at your visit today. This web site gives a general overview about your procedure. However, you should still follow specific instructions given to you by our office regarding your preparation for the procedure.  We sent prescriptions to your pharmacy. 1. Dexilant refills- take in the morning.  2. Zofran, dissolve tablet on the tongue.  3. Viscous lidocaine- swallow 5 cc's or ML's 15 minute before meals.   Push fluids- clear to full liquids for now.  Hold Plavix until after the Endoscopy.

## 2016-10-19 NOTE — Progress Notes (Signed)
Agree with assessment and plan as above.

## 2016-10-20 ENCOUNTER — Encounter: Payer: Self-pay | Admitting: Gastroenterology

## 2016-10-20 ENCOUNTER — Ambulatory Visit (AMBULATORY_SURGERY_CENTER): Payer: Medicare Other | Admitting: Gastroenterology

## 2016-10-20 ENCOUNTER — Other Ambulatory Visit (HOSPITAL_COMMUNITY)
Admission: RE | Admit: 2016-10-20 | Discharge: 2016-10-20 | Disposition: A | Discharge status: Home / Self Care | Payer: Medicare Other | Source: Ambulatory Visit | Attending: Gastroenterology | Admitting: Gastroenterology

## 2016-10-20 VITALS — BP 163/79 | HR 72 | Temp 97.8°F | Resp 19 | Ht 66.5 in | Wt 211.0 lb

## 2016-10-20 DIAGNOSIS — R1310 Dysphagia, unspecified: Secondary | ICD-10-CM | POA: Diagnosis not present

## 2016-10-20 DIAGNOSIS — B3781 Candidal esophagitis: Secondary | ICD-10-CM | POA: Insufficient documentation

## 2016-10-20 DIAGNOSIS — K219 Gastro-esophageal reflux disease without esophagitis: Secondary | ICD-10-CM | POA: Diagnosis present

## 2016-10-20 DIAGNOSIS — K222 Esophageal obstruction: Secondary | ICD-10-CM | POA: Diagnosis not present

## 2016-10-20 MED ORDER — SODIUM CHLORIDE 0.9 % IV SOLN: 500.0000 mL | INTRAVENOUS | Status: DC

## 2016-10-20 MED ORDER — FLUCONAZOLE 100 MG PO TABS: 100.0000 mg | ORAL_TABLET | Freq: Every day | ORAL | 0 refills | Status: DC

## 2016-10-20 NOTE — Progress Notes (Signed)
Osvaldo Angst, CRNA notified of manual BP 200/120 and medications taken this morning.  Patient denies SOB, HA, Dizziness.  No chest pain.  Patient states her BPs are always over 200/100 and if her systolic bp is [id number], she passes out.  Planned is to proceed with procedure.

## 2016-10-20 NOTE — Op Note (Signed)
[facility] Patient Name: Katrina [name] Procedure Date: 10/20/2016 10:08 AM MRN: [id number] Endoscopist: Remo Lipps P. Latayna Ritchie MD, MD Age: 57 Referring MD:  Date of Birth: 1961-02-22 Gender: Female Account #: [id number] Procedure:                Upper GI endoscopy Indications:              Dysphagia, Odynophagia Medicines:                Monitored Anesthesia Care Procedure:                Pre-Anesthesia Assessment:                           - Prior to the procedure, a History and Physical                            was performed, and patient medications and                            allergies were reviewed. The patient's tolerance of                            previous anesthesia was also reviewed. The risks                            and benefits of the procedure and the sedation                            options and risks were discussed with the patient.                            All questions were answered, and informed consent                            was obtained. Prior Anticoagulants: The patient has                            taken Plavix (clopidogrel), last dose was 7 days                            prior to procedure. ASA Grade Assessment: III - A                            patient with severe systemic disease. After                            reviewing the risks and benefits, the patient was                            deemed in satisfactory condition to undergo the                            procedure.  After obtaining informed consent, the endoscope was                            passed under direct vision. Throughout the                            procedure, the patient's blood pressure, pulse, and                            oxygen saturations were monitored continuously. The                            Endoscope was introduced through the mouth, and                            advanced to the second part of duodenum. The upper                      GI endoscopy was accomplished without difficulty.                            The patient tolerated the procedure well. Scope In: Scope Out: Findings:                 Esophagogastric landmarks were identified: the                            Z-line was found at 35 cm, the gastroesophageal                            junction was found at 35 cm and the upper extent of                            the gastric folds was found at 38 cm from the                            incisors.                           A 3 cm hiatal hernia was present.                           One mild benign-appearing, intrinsic stenosis was                            found 35 cm from the incisors. This measured less                            than one cm (in length). A TTS dilator was passed                            through the scope. Dilation with an 18-19-20 mm  balloon dilator was performed to 20 mm with a very                            small mucosal wrent noted                           Multiple white plaques were found in the upper                            third of the esophagus and in the middle third of                            the esophagus. Brushings were obtained in the upper                            third of the esophagus and in the middle third of                            the esophagus to assess for candidiasis                           The entire examined stomach was normal.                           The duodenal bulb and second portion of the                            duodenum were normal. Complications:            No immediate complications. Estimated blood loss:                            Minimal. Estimated Blood Loss:     Estimated blood loss was minimal. Impression:               - Esophagogastric landmarks identified.                           - 3 cm hiatal hernia.                           - Benign-appearing esophageal stenosis. Dilated to                             61mm.                           - Multiple plaques in the upper third of the                            esophagus and in the middle third of the esophagus,                            highly suspicious for esophageal candidiasis.  Brushings performed.                           - Normal stomach.                           - Normal duodenal bulb and second portion of the                            duodenum.                           Overall, suspect candidiasis is causing this                            patient's symptoms. Recommendation:           - Patient has a contact number available for                            emergencies. The signs and symptoms of potential                            delayed complications were discussed with the                            patient. Return to normal activities tomorrow.                            Written discharge instructions were provided to the                            patient.                           - Resume previous diet.                           - Continue present medications.                           - Await pathology results.                           - Start fluconazole 400mg  PO x 1 today, then 200mg                             daily for another 13 days to treat for candidiasis Remo Lipps P. Natassia Guthridge MD, MD 10/20/2016 11:09:40 AM This report has been signed electronically.

## 2016-10-20 NOTE — Patient Instructions (Signed)
Discharge instructions given. Handout on a dilatation diet. Esophageal brushing. Resume previous medications. Prescription sent in to pharmacy. YOU HAD AN ENDOSCOPIC PROCEDURE TODAY AT [facility]:   Refer to the procedure report that was given to you for any specific questions about what was found during the examination.  If the procedure report does not answer your questions, please call your gastroenterologist to clarify.  If you requested that your care partner not be given the details of your procedure findings, then the procedure report has been included in a sealed envelope for you to review at your convenience later.  YOU SHOULD EXPECT: Some feelings of bloating in the abdomen. Passage of more gas than usual.  Walking can help get rid of the air that was put into your GI tract during the procedure and reduce the bloating. If you had a lower endoscopy (such as a colonoscopy or flexible sigmoidoscopy) you may notice spotting of blood in your stool or on the toilet paper. If you underwent a bowel prep for your procedure, you may not have a normal bowel movement for a few days.  Please Note:  You might notice some irritation and congestion in your nose or some drainage.  This is from the oxygen used during your procedure.  There is no need for concern and it should clear up in a day or so.  SYMPTOMS TO REPORT IMMEDIATELY:   Following upper endoscopy (EGD)  Vomiting of blood or coffee ground material  New chest pain or pain under the shoulder blades  Painful or persistently difficult swallowing  New shortness of breath  Fever of 100F or higher  Black, tarry-looking stools  For urgent or emergent issues, a gastroenterologist can be reached at any hour by calling [phone].   DIET:  We do recommend a small meal at first, but then you may proceed to your regular diet.  Drink plenty of fluids but you should avoid alcoholic beverages for 24 hours.  ACTIVITY:  You  should plan to take it easy for the rest of today and you should NOT DRIVE or use heavy machinery until tomorrow (because of the sedation medicines used during the test).    FOLLOW UP: Our staff will call the number listed on your records the next business day following your procedure to check on you and address any questions or concerns that you may have regarding the information given to you following your procedure. If we do not reach you, we will leave a message.  However, if you are feeling well and you are not experiencing any problems, there is no need to return our call.  We will assume that you have returned to your regular daily activities without incident.  If any biopsies were taken you will be contacted by phone or by letter within the next 1-3 weeks.  Please call us at [phone] if you have not heard about the biopsies in 3 weeks.    SIGNATURES/CONFIDENTIALITY: You and/or your care partner have signed paperwork which will be entered into your electronic medical record.  These signatures attest to the fact that that the information above on your After Visit Summary has been reviewed and is understood.  Full responsibility of the confidentiality of this discharge information lies with you and/or your care-partner.

## 2016-10-20 NOTE — Progress Notes (Signed)
A/ox3 pleased with MAC, report to Celia RN 

## 2016-10-21 ENCOUNTER — Telehealth: Payer: Self-pay

## 2016-10-21 ENCOUNTER — Other Ambulatory Visit: Payer: Self-pay

## 2016-10-21 ENCOUNTER — Encounter: Payer: Self-pay | Admitting: Gastroenterology

## 2016-10-21 MED ORDER — FLUCONAZOLE 200 MG PO TABS: ORAL_TABLET | ORAL | 0 refills | Status: DC

## 2016-10-21 NOTE — Telephone Encounter (Signed)
  Follow up Call-  Call back number 10/20/2016  Post procedure Call Back phone  # [phone]  Permission to leave phone message Yes  Some recent data might be hidden     Patient questions:  Do you have a fever, pain , or abdominal swelling? No. Pain Score  0 *  Have you tolerated food without any problems? Yes.    Have you been able to return to your normal activities? Yes.    Do you have any questions about your discharge instructions: Diet   No. Medications  No. Follow up visit  No.  Do you have questions or concerns about your Care? No.  Actions: * If pain score is 4 or above: No action needed, pain <4.

## 2016-10-22 ENCOUNTER — Encounter: Payer: Self-pay | Admitting: Vascular Surgery

## 2016-10-27 NOTE — Addendum Note (Signed)
Addended by: Gara Kroner L on: 10/27/2016 04:20 PM   Modules accepted: Orders

## 2016-10-27 NOTE — Progress Notes (Signed)
Katrina [name] is set to start shots next week in [place]. However she has not gotten her lab work done, which is needed for vial preparation. I will have a member of our nursing staff call to make sure that these labs have been collected. If she has had it done, we will need to hunt it down.   Salvatore Marvel, MD [facility]

## 2016-10-30 LAB — ALLERGENS, ZONE 3
Alternaria Alternata IgE: <0.1 kU/L
Aspergillus Fumigatus IgE: <0.1 kU/L
Bahia Grass IgE: <0.1 kU/L
Bermuda Grass IgE: <0.1 kU/L
Cat Dander IgE: <0.1 kU/L
Cedar, Mountain IgE: <0.1 kU/L
Cladosporium Herbarum IgE: <0.1 kU/L
Cockroach, American IgE: <0.1 kU/L
Common Silver Birch IgE: <0.1 kU/L
D Farinae IgE: 0.15 kU/L — AB
D Pteronyssinus IgE: 0.19 kU/L — AB
Dog Dander IgE: <0.1 kU/L
Elm, American IgE: <0.1 kU/L
Hickory, White IgE: <0.1 kU/L
Johnson Grass IgE: <0.1 kU/L
Kentucky Bluegrass IgE: <0.1 kU/L
Maple/Box Elder IgE: <0.1 kU/L
Mucor Racemosus IgE: <0.1 kU/L
Nettle IgE: <0.1 kU/L
Oak, White IgE: <0.1 kU/L
Penicillium Chrysogen IgE: <0.1 kU/L
Pigweed, Rough IgE: <0.1 kU/L
Plantain, English IgE: <0.1 kU/L
Ragweed, Short IgE: <0.1 kU/L
Stemphylium Herbarum IgE: <0.1 kU/L
White Mulberry IgE: <0.1 kU/L

## 2016-11-02 ENCOUNTER — Ambulatory Visit: Payer: Medicare Other

## 2016-11-02 ENCOUNTER — Other Ambulatory Visit: Payer: Self-pay | Admitting: Orthopedic Surgery

## 2016-11-02 DIAGNOSIS — M67911 Unspecified disorder of synovium and tendon, right shoulder: Secondary | ICD-10-CM

## 2016-11-02 NOTE — Addendum Note (Signed)
Addended by: Valentina Shaggy on: 11/02/2016 01:52 PM   Modules accepted: Orders

## 2016-11-02 NOTE — Progress Notes (Signed)
Ms. Marrufo blood work showed sensitizations to dust mites. Therefore, we will add that to her vials. Orders for IT placed.  Salvatore Marvel, MD [facility]

## 2016-11-04 ENCOUNTER — Ambulatory Visit (INDEPENDENT_AMBULATORY_CARE_PROVIDER_SITE_OTHER): Payer: Medicare Other | Admitting: Vascular Surgery

## 2016-11-04 ENCOUNTER — Other Ambulatory Visit: Payer: Self-pay

## 2016-11-04 ENCOUNTER — Encounter: Payer: Self-pay | Admitting: Vascular Surgery

## 2016-11-04 VITALS — BP 198/124 | HR 91 | Temp 98.5°F | Resp 16 | Ht 66.5 in | Wt 206.0 lb

## 2016-11-04 DIAGNOSIS — F17210 Nicotine dependence, cigarettes, uncomplicated: Secondary | ICD-10-CM | POA: Diagnosis not present

## 2016-11-04 DIAGNOSIS — I10 Essential (primary) hypertension: Secondary | ICD-10-CM | POA: Diagnosis not present

## 2016-11-04 NOTE — Progress Notes (Signed)
Vials to be made 11-04-16  jm

## 2016-11-04 NOTE — Progress Notes (Signed)
Vitals:   11/04/16 1046  BP: (!) 187/102  Pulse: 96  Resp: 16  Temp: 98.5 F (36.9 C)  SpO2: 96%  Weight: 206 lb (93.4 kg)  Height: 5' 6.5" (1.689 m)

## 2016-11-04 NOTE — Progress Notes (Addendum)
Patient name: Katrina [name] MRN: [id number] DOB: 06-18-1960 Sex: female   REASON FOR CONSULT:    Poorly controlled blood pressure. The physician requesting the consult is Dr. Hollie Salk.   HPI:   Katrina [name] is a 56 y.o. female, Referred for evaluation of poorly controlled blood pressure. She tells me that she has had very poorly controlled blood pressure really since 2008. She tells me that her blood pressure systolic consistently runs above 200. She is on multiple medications for blood pressure and most recently had a renal artery duplex which suggested possible bilateral renal artery stenoses. The patient was sent for vascular consultation.  The patient denies any headaches, hematuria or flank pain.  She is on Norvasc, Catapres patch, and Cozaar for her blood pressure which is still poorly controlled.   Her risk factors for peripheral vascular disease include hypertension, heavy tobacco use, and a family history of premature cardiovascular disease. She denies any history of diabetes or hypercholesterolemia.  She does tell me that she had a silent heart attack in 2000. She has stable angina. She had a heart catheter couple years ago according to the patient. I cannot find records of this cath I believe this was done elsewhere.  She is on Plavix and is not on aspirin.  She smokes 2 packs per day and has been smoking for over 40 years.  Past Medical History:  Diagnosis Date  . Allergy   . Anginal pain ([place])    - none currently  . Anxiety    takes Valium daily as needed  . Asthma    has inhalers but doesn't use  . CAD (coronary artery disease)    Moderate nonobstructive 2012-2013, [place]; No angiographic evidence of CAD 01/31/15 [facility]  . Chronic back pain    budlging disc   . COPD (chronic obstructive pulmonary disease) ([place])   . Depression   . Diverticulitis   . Family history of adverse reaction to anesthesia    daughter gets extremely sick   . GERD (gastroesophageal reflux  disease)    takes Dexilant daily  . History of blood transfusion 1975/1976   no abnormal reaction noted  . History of bronchitis 06/2015  . History of colitis   . History of colon polyps    benign  . History of gastric ulcer   . History of migraine    last one a few days ago  . History of MRSA infection 2017  . Hyperlipidemia    was on meds but has been off over a yr  . Hypertension    takes Amlodipine and Maxzide daily  . Insomnia    takes Ambien nightly  . Lung nodules   . Microscopic hematuria   . MS (multiple sclerosis) ([place])    questionable per pt  . Osteoporosis   . Peripheral neuropathy    weakness,numbness,and tingling. Takes Gabapentin daily  . Pneumonia 06/2015  . Restless leg syndrome    takes Requip daily  . Stroke ([place])    x 2 --takes Plavix daily.Left sided weakness    Family History  Problem Relation Age of Onset  . Coronary artery disease Father   . Emphysema Father   . Heart attack Father   . Stroke Father   . Cancer Father        Unsure of type   . Allergic rhinitis Father   . Depression Mother   . Cancer Mother        skin cancer  . Bipolar disorder  Brother   . Drug abuse Brother   . Cancer Brother        leukemia  . Bipolar disorder Daughter   . Allergic rhinitis Daughter   . Asthma Daughter   . Urticaria Daughter   . Diabetes Maternal Grandmother   . Stomach cancer Maternal Grandfather   . Cancer Maternal Grandfather        stomach   . Angioedema Neg Hx   . Atopy Neg Hx   . Eczema Neg Hx   . Immunodeficiency Neg Hx     SOCIAL HISTORY: Social History   Social History  . Marital status: Divorced    Spouse name: N/A  . Number of children: 3  . Years of education: Some college   Occupational History  .      Disability for back pain   Social History Main Topics  . Smoking status: Current Every Day Smoker    Packs/day: 2.00    Years: 44.00    Types: Cigarettes    Start date: 03/04/1971  . Smokeless tobacco: Never Used  .  Alcohol use No     Comment: 10-06-15 per pt no   . Drug use: No     Comment: 10-06-15 per pt no  . Sexual activity: Not Currently    Birth control/ protection: Surgical   Other Topics Concern  . Not on file   Social History Narrative   Lives at home with her daughter.   Right-handed.   1-2 cups coffee in the morning and 2 sodas per day.       Allergies  Allergen Reactions  . Other Anaphylaxis and Swelling    [place]  . Penicillins Anaphylaxis    Has patient had a PCN reaction causing immediate rash, facial/tongue/throat swelling, SOB or lightheadedness with hypotension: Yes Has patient had a PCN reaction causing severe rash involving mucus membranes or skin necrosis: Yes Has patient had a PCN reaction that required hospitalization Yes Has patient had a PCN reaction occurring within the last 10 years: No If all of the above answers are "NO", then may proceed with Cephalosporin use.   . Zithromax [Azithromycin] Anaphylaxis  . Aspirin Other (See Comments)    Due to stomach ulcers.   . Pineapple Rash  . Strawberry Extract Rash and Hives  . Wheat Bran Other (See Comments)    Sneezing, rhinorrhea  . Yeast-Related Products Other (See Comments)    Sneezing, rhinorrhea  . Aspartame And Phenylalanine Palpitations  . Mushroom Extract Complex Rash  . Nicardipine Nausea And Vomiting and Other (See Comments)    shaking    Current Outpatient Prescriptions  Medication Sig Dispense Refill  . albuterol (PROVENTIL HFA;VENTOLIN HFA) 108 (90 Base) MCG/ACT inhaler Inhale 1 puff into the lungs every 4 (four) hours as needed for wheezing.    Marland Kitchen amLODipine (NORVASC) 10 MG tablet Take 1 tablet (10 mg total) by mouth daily. 30 tablet 6  . aspirin-acetaminophen-caffeine (EXCEDRIN MIGRAINE) 250-250-65 MG tablet Take 2 tablets by mouth every 6 (six) hours as needed for migraine.    . chlorthalidone (HYGROTON) 25 MG tablet Take 25 mg by mouth daily. Take 1 tablet by mouth 2 times daily    . cloNIDine  (CATAPRES - DOSED IN MG/24 HR) 0.3 mg/24hr patch Place 0.3 mg onto the skin once a week. Apply 1 patch to the skin weekly.    . clopidogrel (PLAVIX) 75 MG tablet Take 75 mg by mouth daily.    Marland Kitchen dexlansoprazole (DEXILANT) 60 MG capsule  Take 1 tab by mouth every morning. 30 capsule 1  . diazepam (VALIUM) 5 MG tablet Take 1 tablet (5 mg total) by mouth every 8 (eight) hours as needed for anxiety. (Patient taking differently: Take 5 mg by mouth daily as needed for anxiety. ) 30 tablet 0  . gabapentin (NEURONTIN) 600 MG tablet Take 600 mg by mouth 4 (four) times daily.    Marland Kitchen losartan (COZAAR) 50 MG tablet Take 50 mg by mouth 2 (two) times daily.   3  . methocarbamol (ROBAXIN) 750 MG tablet Take 750 mg by mouth 2 (two) times daily as needed for muscle spasms.  1  . oxyCODONE-acetaminophen (PERCOCET) 7.5-325 MG tablet Take 1 tablet by mouth every 6 (six) hours as needed (for pain.).     Marland Kitchen rOPINIRole (REQUIP) 4 MG tablet Take 4 mg by mouth at bedtime.    Marland Kitchen zolpidem (AMBIEN) 10 MG tablet Take 10 mg by mouth at bedtime as needed for sleep.     Marland Kitchen albuterol (PROVENTIL) (2.5 MG/3ML) 0.083% nebulizer solution Take 3 mLs by nebulization daily as needed for shortness of breath.    . fluconazole (DIFLUCAN) 200 MG tablet Take 2 tablets(400 mg) x one dose, then take 1 tablet (200 mg) daily until finished (Patient not taking: Reported on 11/04/2016) 15 tablet 0  . lidocaine (XYLOCAINE) 2 % solution Take 5 cc's by mouth 15 minutes before a meal. (Patient not taking: Reported on 11/04/2016) 100 mL 1  . ondansetron (ZOFRAN ODT) 4 MG disintegrating tablet Dissolve 1 tablet on the tongue every 6 hours as needed for nausea. (Patient not taking: Reported on 11/04/2016) 40 tablet 0  . pregabalin (LYRICA) 75 MG capsule Take 75 mg by mouth 2 (two) times daily.    . Suvorexant (BELSOMRA) 10 MG TABS Take 10 mg by mouth. Take 1 tab within 30 minutes of going to bed.    . umeclidinium-vilanterol (ANORO ELLIPTA) 62.5-25 MCG/INH AEPB  Inhale 1 puff into the lungs daily. Inhale 1 puff daily.     Current Facility-Administered Medications  Medication Dose Route Frequency Provider Last Rate Last Dose  . 0.9 %  sodium chloride infusion  500 mL Intravenous Continuous Armbruster, Renelda Loma, MD        REVIEW OF SYSTEMS:  [X]  denotes positive finding, [ ]  denotes negative finding Cardiac  Comments:  Chest pain or chest pressure: X   Shortness of breath upon exertion: X   Short of breath when lying flat: X   Irregular heart rhythm:        Vascular    Pain in calf, thigh, or hip brought on by ambulation: X   Pain in feet at night that wakes you up from your sleep:     Blood clot in your veins:    Leg swelling:         Pulmonary    Oxygen at home:    Productive cough:  X   Wheezing:         Neurologic    Sudden weakness in arms or legs:     Sudden numbness in arms or legs:     Sudden onset of difficulty speaking or slurred speech:    Temporary loss of vision in one eye:     Problems with dizziness:         Gastrointestinal    Blood in stool:     Vomited blood:         Genitourinary    Burning when urinating:  Blood in urine:        Psychiatric    Major depression:         Hematologic    Bleeding problems:    Problems with blood clotting too easily:        Skin    Rashes or ulcers:        Constitutional    Fever or chills:     PHYSICAL EXAM:   Vitals:   11/04/16 1046 11/04/16 1050  BP: (!) 187/102 (!) 198/124  Pulse: 96 91  Resp: 16   Temp: 98.5 F (36.9 C)   SpO2: 96%   Weight: 206 lb (93.4 kg)   Height: 5' 6.5" (1.689 m)   Body mass index is 32.75 kg/m.   GENERAL: The patient is a well-nourished female, in no acute distress. The vital signs are documented above. CARDIAC: There is a regular rate and rhythm.  VASCULAR: I do not detect carotid bruits. She does have palpable femoral pulses although they are somewhat difficult to palpate because of her obesity. She has palpable  popliteal, dorsalis pedis, and posterior tibial pulses bilaterally. She has mild bilateral lower extremity swelling. PULMONARY: There is good air exchange bilaterally without wheezing or rales. ABDOMEN: Soft and non-tender with normal pitched bowel sounds.  MUSCULOSKELETAL: There are no major deformities or cyanosis. NEUROLOGIC: No focal weakness or paresthesias are detected. SKIN: There are no ulcers or rashes noted. PSYCHIATRIC: The patient has a normal affect.  DATA:    RENAL DUPLEX: I have reviewed the renal artery duplex scan that was done on 09/03/2016. The right kidney measured 11 cm in length. The left kidney measured 11.5 cm in length. There were elevated velocities in the proximal renal arteries bilaterally.  LABS: Most recent labs show a GFR of 87.  MEDICAL ISSUES:   POORLY CONTROLLED BLOOD PRESSURE: Based on her renal artery duplex scan, and history, this patient may have bilateral renal artery stenoses. I have recommended we proceed with arteriography to further evaluate her for renal artery stenoses. If in fact she does have significant disease this could potentially be addressed with renal angioplasty at the same time. I have discussed the indications for the procedure and the potential complications, including, but not limited to, bleeding, arterial thrombosis, dissection, the need for surgery or loss of a kidney. I have scheduled this with Dr. Ruta Hinds who has a much larger experience with renal artery artery angioplasty than myself. Her procedure is scheduled for 11/12/2016. She is on Plavix. Her renal function is normal. Creatinine is 0.77 with a GFR of 87. We have discussed the importance of tobacco cessation. I spent more than 3 minutes on this conversation.  Deitra Mayo [facility] [phone]

## 2016-11-05 DIAGNOSIS — J301 Allergic rhinitis due to pollen: Secondary | ICD-10-CM | POA: Diagnosis not present

## 2016-11-08 DIAGNOSIS — J3089 Other allergic rhinitis: Secondary | ICD-10-CM | POA: Diagnosis not present

## 2016-11-09 ENCOUNTER — Ambulatory Visit: Payer: Medicare Other

## 2016-11-17 ENCOUNTER — Ambulatory Visit
Admission: RE | Admit: 2016-11-17 | Discharge: 2016-11-17 | Disposition: A | Discharge status: Home / Self Care | Payer: Medicare Other | Source: Ambulatory Visit | Attending: Orthopedic Surgery | Admitting: Orthopedic Surgery

## 2016-11-17 DIAGNOSIS — M67911 Unspecified disorder of synovium and tendon, right shoulder: Secondary | ICD-10-CM

## 2016-11-17 DIAGNOSIS — R030 Elevated blood-pressure reading, without diagnosis of hypertension: Secondary | ICD-10-CM | POA: Diagnosis not present

## 2016-11-27 ENCOUNTER — Emergency Department (HOSPITAL_COMMUNITY): Payer: Medicare Other

## 2016-11-27 ENCOUNTER — Other Ambulatory Visit: Payer: Self-pay

## 2016-11-27 ENCOUNTER — Encounter (HOSPITAL_COMMUNITY): Payer: Self-pay | Admitting: *Deleted

## 2016-11-27 ENCOUNTER — Emergency Department (HOSPITAL_COMMUNITY)
Admission: EM | Admit: 2016-11-27 | Discharge: 2016-11-28 | Disposition: A | Discharge status: Home / Self Care | Payer: Medicare Other | Attending: Emergency Medicine | Admitting: Emergency Medicine

## 2016-11-27 DIAGNOSIS — J449 Chronic obstructive pulmonary disease, unspecified: Secondary | ICD-10-CM | POA: Insufficient documentation

## 2016-11-27 DIAGNOSIS — Z7982 Long term (current) use of aspirin: Secondary | ICD-10-CM | POA: Insufficient documentation

## 2016-11-27 DIAGNOSIS — I150 Renovascular hypertension: Secondary | ICD-10-CM | POA: Diagnosis not present

## 2016-11-27 DIAGNOSIS — J45909 Unspecified asthma, uncomplicated: Secondary | ICD-10-CM | POA: Diagnosis not present

## 2016-11-27 DIAGNOSIS — R079 Chest pain, unspecified: Secondary | ICD-10-CM | POA: Diagnosis not present

## 2016-11-27 DIAGNOSIS — I2510 Atherosclerotic heart disease of native coronary artery without angina pectoris: Secondary | ICD-10-CM | POA: Insufficient documentation

## 2016-11-27 DIAGNOSIS — F17210 Nicotine dependence, cigarettes, uncomplicated: Secondary | ICD-10-CM | POA: Diagnosis not present

## 2016-11-27 DIAGNOSIS — R0602 Shortness of breath: Secondary | ICD-10-CM | POA: Insufficient documentation

## 2016-11-27 DIAGNOSIS — E119 Type 2 diabetes mellitus without complications: Secondary | ICD-10-CM | POA: Insufficient documentation

## 2016-11-27 DIAGNOSIS — I10 Essential (primary) hypertension: Secondary | ICD-10-CM | POA: Diagnosis not present

## 2016-11-27 DIAGNOSIS — R61 Generalized hyperhidrosis: Secondary | ICD-10-CM | POA: Insufficient documentation

## 2016-11-27 LAB — CBC
HCT: 39.3 % (ref 36.0–46.0)
HEMOGLOBIN: 12.9 g/dL (ref 12.0–15.0)
MCH: 29.2 pg (ref 26.0–34.0)
MCHC: 32.8 g/dL (ref 30.0–36.0)
MCV: 88.9 fL (ref 78.0–100.0)
PLATELETS: 251 10*3/uL (ref 150–400)
RBC: 4.42 MIL/uL (ref 3.87–5.11)
RDW: 14.8 % (ref 11.5–15.5)
WBC: 8.9 10*3/uL (ref 4.0–10.5)

## 2016-11-27 NOTE — ED Triage Notes (Signed)
Pt reports having chest discomfort and sob since last Thursday. Pt also reports diaphoresis.

## 2016-11-27 NOTE — ED Provider Notes (Signed)
[facility] DEPT Provider Note   CSN: [id number] Arrival date & time: 11/27/16  2230     History   Chief Complaint Chief Complaint  Patient presents with  . Chest Pain    HPI Katrina [name] is a 56 y.o. female.  Patient presents to the emergency para for evaluation of chest pain. Patient has been having intermittent episodes of chest pain for 2 days. She reports a stabbing and aching pain that occurs in the left chest intermittently and lasts for approximately a minute and then resolves. Episodes occur unpredictably, not related to exertion. She has noticed that she feels slightly short of breath. After an episode occurs she does experience some diaphoresis and chills. She has not had any cough, chest congestion.      Past Medical History:  Diagnosis Date  . Allergy   . Anginal pain ([place])    - none currently  . Anxiety    takes Valium daily as needed  . Asthma    has inhalers but doesn't use  . CAD (coronary artery disease)    Moderate nonobstructive 2012-2013, [place]; No angiographic evidence of CAD 01/31/15 [facility]  . Chronic back pain    budlging disc   . COPD (chronic obstructive pulmonary disease) ([place])   . Depression   . Diverticulitis   . Family history of adverse reaction to anesthesia    daughter gets extremely sick   . GERD (gastroesophageal reflux disease)    takes Dexilant daily  . History of blood transfusion 1975/1976   no abnormal reaction noted  . History of bronchitis 06/2015  . History of colitis   . History of colon polyps    benign  . History of gastric ulcer   . History of migraine    last one a few days ago  . History of MRSA infection 2017  . Hyperlipidemia    was on meds but has been off over a yr  . Hypertension    takes Amlodipine and Maxzide daily  . Insomnia    takes Ambien nightly  . Lung nodules   . Microscopic hematuria   . MS (multiple sclerosis) ([place])    questionable per pt  . Osteoporosis   . Peripheral neuropathy    weakness,numbness,and tingling. Takes Gabapentin daily  . Pneumonia 06/2015  . Restless leg syndrome    takes Requip daily  . Stroke ([place])    x 2 --takes Plavix daily.Left sided weakness    Patient Active Problem List   Diagnosis Date Noted  . Adverse food reaction 10/14/2016  . Pulmonary nodules 10/14/2016  . Non-seasonal allergic rhinitis due to fungal spores 10/14/2016  . Cigarette smoker 09/24/2016  . Cerebral microvascular disease 08/19/2016  . Non compliance with medical treatment 05/11/2016  . Leg pain, anterior, right 05/11/2016  . Syncope 05/02/2016  . Type 2 diabetes mellitus with diabetic neuropathy ([place]) 05/02/2016  . Restless leg syndrome 05/02/2016  . Cerebrovascular accident (CVA) ([place])   . Vascular headache   . History of chest pain   . Generalized anxiety disorder   . Chronic bilateral low back pain without sciatica   . Peripheral neuropathy   . Migraine without status migrainosus, not intractable   . PTSD (post-traumatic stress disorder)   . History of cerebrovascular accident (CVA) with residual deficit   . CVA (cerebral vascular accident) ([place]) 04/03/2016  . Stroke (cerebrum) ([place]) 04/03/2016  . Post traumatic stress disorder 10/06/2015  . Precordial pain   . Migraine syndrome 01/24/2015  . Migraine  01/24/2015  . TIA (transient ischemic attack) 12/25/2014  . Hypertensive urgency   . Other chest pain   . Malignant hypertension   . Hypokalemia 04/16/2014  . Muscle weakness (generalized) 04/05/2014  . Stiffness of left hip joint 04/05/2014  . Pain in left hip 04/05/2014  . Diastolic dysfunction 26/37/8588  . Normal coronary arteries 03/20/2014  . PUD (peptic ulcer disease) 03/20/2014  . Type 2 diabetes, uncontrolled, with neuropathy ([place]) 03/20/2014  . Morbid obesity due to excess calories ([place]) 03/20/2014  . Left-sided weakness 03/20/2014  . Paresthesias 03/20/2014  . Cerebrovascular disease 03/20/2014  . Dyspnea on exertion 03/05/2012  . Tobacco  abuse 03/05/2012  . Moderate COPD (chronic obstructive pulmonary disease) ([place]) 11/23/2011  . GERD (gastroesophageal reflux disease) 11/23/2011  . Uncontrolled hypertension 11/23/2011    Past Surgical History:  Procedure Laterality Date  . ABDOMINAL HYSTERECTOMY    . ANKLE SURGERY Bilateral   . APPENDECTOMY    . BACK SURGERY     fusion   . CARDIAC CATHETERIZATION N/A 01/31/2015   Procedure: Left Heart Cath and Coronary Angiography;  Surgeon: Burnell Blanks, MD;  Location: [facility];  Service: Cardiovascular;  Laterality: N/A;  . CHOLECYSTECTOMY    . COLONOSCOPY    . ESOPHAGOGASTRODUODENOSCOPY    . NOSE SURGERY     x 2  . RADIOLOGY WITH ANESTHESIA N/A 01/15/2016   Procedure: MRI LUMBAR SPINE WITHOUT;  Surgeon: Medication Radiologist, MD;  Location: [facility];  Service: Radiology;  Laterality: N/A;  . RADIOLOGY WITH ANESTHESIA N/A 06/29/2016   Procedure: MRI OF THE BRAIN WITH AND WITHOUT;  Surgeon: Medication Radiologist, MD;  Location: [facility];  Service: Radiology;  Laterality: N/A;  . TONSILLECTOMY    . 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 tumor removed from left breast      OB History    No data available       Home Medications    Prior to Admission medications   Medication Sig Start Date End Date Taking? Authorizing Provider  amLODipine (NORVASC) 10 MG tablet Take 1 tablet (10 mg total) by mouth daily. 06/05/14   Herminio Commons, MD  aspirin-acetaminophen-caffeine (EXCEDRIN MIGRAINE) [phone] MG tablet Take 2 tablets by mouth daily as needed for migraine.     [provider]  chlorthalidone (HYGROTON) 25 MG tablet Take 25 mg by mouth 2 (two) times daily.     [provider]  clopidogrel (PLAVIX) 75 MG tablet Take 75 mg by mouth daily.    [provider]  dexlansoprazole (DEXILANT) 60 MG capsule Take 1 tab by mouth every morning. 10/18/16   E[place]wood, Amy S, PA-C  diazepam (VALIUM) 5 MG tablet Take 1 tablet (5 mg total) by mouth  every 8 (eight) hours as needed for anxiety. 05/04/16   Orvan Falconer, MD  fluconazole (DIFLUCAN) 200 MG tablet Take 2 tablets(400 mg) x one dose, then take 1 tablet (200 mg) daily until finished Patient not taking: Reported on 11/04/2016 10/21/16   Armbruster, Renelda Loma, MD  gabapentin (NEURONTIN) 600 MG tablet Take 600 mg by mouth 4 (four) times daily.    [provider]  lidocaine (XYLOCAINE) 2 % solution Take 5 cc's by mouth 15 minutes before a meal. Patient not taking: Reported on 11/04/2016 10/18/16   Esterwood, Amy S, PA-C  losartan (COZAAR) 50 MG tablet Take 50 mg by mouth 2 (two) times daily.  06/09/16   [provider]  ondansetron (ZOFRAN ODT) 4 MG  disintegrating tablet Dissolve 1 tablet on the tongue every 6 hours as needed for nausea. Patient not taking: Reported on 11/04/2016 10/18/16   Esterwood, Amy S, PA-C  oxyCODONE-acetaminophen (PERCOCET) 7.5-325 MG tablet Take 1 tablet by mouth every 6 (six) hours as needed (for pain.).  03/25/16   [provider]  rOPINIRole (REQUIP) 4 MG tablet Take 4 mg by mouth at bedtime.    [provider]  zolpidem (AMBIEN) 10 MG tablet Take 10 mg by mouth at bedtime.  03/25/16   [provider]    Family History Family History  Problem Relation Age of Onset  . Coronary artery disease Father   . Emphysema Father   . Heart attack Father   . Stroke Father   . Cancer Father        Unsure of type   . Allergic rhinitis Father   . Depression Mother   . Cancer Mother        skin cancer  . Bipolar disorder Brother   . Drug abuse Brother   . Cancer Brother        leukemia  . Bipolar disorder Daughter   . Allergic rhinitis Daughter   . Asthma Daughter   . Urticaria Daughter   . Diabetes Maternal Grandmother   . Stomach cancer Maternal Grandfather   . Cancer Maternal Grandfather        stomach   . Angioedema Neg Hx   . Atopy Neg Hx   . Eczema Neg Hx   . Immunodeficiency Neg Hx     Social History Social  History  Substance Use Topics  . Smoking status: Current Every Day Smoker    Packs/day: 2.00    Years: 44.00    Types: Cigarettes    Start date: 03/04/1971  . Smokeless tobacco: Never Used  . Alcohol use No     Comment: 10-06-15 per pt no      Allergies   Other; Penicillins; Zithromax [azithromycin]; Aspirin; Pineapple; Strawberry extract; Wheat bran; Yeast-related products; Aspartame and phenylalanine; Mushroom extract complex; and Nicardipine   Review of Systems Review of Systems  Constitutional: Positive for diaphoresis.  Respiratory: Positive for shortness of breath.   Cardiovascular: Positive for chest pain.  All other systems reviewed and are negative.    Physical Exam Updated Vital Signs BP (!) 201/97   Pulse 71   Temp 98 F (36.7 C) (Oral)   Resp (!) 22   Ht 5' 6.5" (1.689 m)   Wt 95.7 kg (211 lb)   SpO2 98%   BMI 33.55 kg/m   Physical Exam  Constitutional: She is oriented to person, place, and time. She appears well-developed and well-nourished. No distress.  HENT:  Head: Normocephalic and atraumatic.  Right Ear: Hearing normal.  Left Ear: Hearing normal.  Nose: Nose normal.  Mouth/Throat: Oropharynx is clear and moist and mucous membranes are normal.  Eyes: Conjunctivae and EOM are normal. Pupils are equal, round, and reactive to light.  Neck: Normal range of motion. Neck supple.  Cardiovascular: Regular rhythm, S1 normal and S2 normal.  Exam reveals no gallop and no friction rub.   No murmur heard. Pulmonary/Chest: Effort normal and breath sounds normal. No respiratory distress. She exhibits tenderness.    Abdominal: Soft. Normal appearance and bowel sounds are normal. There is no hepatosplenomegaly. There is no tenderness. There is no rebound, no guarding, no tenderness at McBurney's point and negative Murphy's sign. No hernia.  Musculoskeletal: Normal range of motion.  Neurological: She is  alert and oriented to person, place, and time. She has normal  strength. No cranial nerve deficit or sensory deficit. Coordination normal. GCS eye subscore is 4. GCS verbal subscore is 5. GCS motor subscore is 6.  Skin: Skin is warm, dry and intact. No rash noted. No cyanosis.  Psychiatric: She has a normal mood and affect. Her speech is normal and behavior is normal. Thought content normal.  Nursing note and vitals reviewed.    ED Treatments / Results  Labs (all labs ordered are listed, but only abnormal results are displayed) Labs Reviewed  BASIC METABOLIC PANEL  CBC  TROPONIN I  [place], ED    EKG  EKG Interpretation  Date/Time:  Saturday November 27 2016 22:52:32 EDT Ventricular Rate:  82 PR Interval:    QRS Duration: 97 QT Interval:  449 QTC Calculation: 525 R Axis:   39 Text Interpretation:  Sinus rhythm Probable left atrial enlargement RSR' in V1 or V2, probably normal variant Prolonged QT interval No significant change since last tracing Confirmed by Orpah Greek [phone]) on 11/27/2016 11:24:38 PM       Radiology Dg Chest 2 View  Result Date: 11/27/2016 CLINICAL DATA:  Pt reports having chest discomfort and sob since last Thursday. Pt also reports diaphoresis. EXAM: CHEST  2 VIEW COMPARISON:  Chest x-ray dated 10/14/2016. FINDINGS: Heart size and mediastinal contours are within normal limits. Coarse lung markings bilaterally suggests chronic interstitial lung disease. No new lung findings. No pleural effusion or pneumothorax seen. Osseous structures about the chest are unremarkable. IMPRESSION: 1. No active cardiopulmonary disease. No evidence of pneumonia or pulmonary edema. 2. Probable chronic interstitial lung disease. Electronically Signed   By: Franki Cabot M.D.   On: 11/27/2016 23:32    Procedures Procedures (including critical care time)  Medications Ordered in ED Medications  cloNIDine (CATAPRES) tablet 0.2 mg (0.2 mg Oral Given 11/28/16 0125)     Initial Impression /  Assessment and Plan / ED Course  I have reviewed the triage vital signs and the nursing notes.  Pertinent labs & imaging results that were available during my care of the patient were reviewed by me and considered in my medical decision making (see chart for details).     Patient presents to the emergency department for evaluation of chest pain. Patient has been having atypical chest pain for more than a day. She has intermittent episodes of pain on the left side of her chest that last only a minute or so at a time. These episodes are nonexertional. She does have some tenderness in the area on examination here in the ER indicating reproducibility. Workup is negative. She did report some diaphoresis intermittently with pain over the last week, none seen here in the ER. Reviewing her records reveals cardiac catheterization 2 years ago that did not show any significant coronary artery disease. Patient has been hypertensive here. She reports refractory hypertension despite 4 agents. She is scheduled for renal artery stenting next week. Patient has had a second troponin after 3 hours that is still 0, it is felt that her chest pain is not cardiac related or indicative of acute coronary syndrome and she is safe for discharge with prompt follow-up.  Final Clinical Impressions(s) / ED Diagnoses   Final diagnoses:  Chest pain, unspecified type  Renovascular hypertension    New Prescriptions New Prescriptions   No medications on file     Orpah Greek, MD 11/28/16 [phone]

## 2016-11-28 DIAGNOSIS — R079 Chest pain, unspecified: Secondary | ICD-10-CM | POA: Diagnosis not present

## 2016-11-28 LAB — TROPONIN I: Troponin I: <0.03 ng/mL (ref <0.03)

## 2016-11-28 LAB — BASIC METABOLIC PANEL
Anion gap: 6 (ref 5–15)
BUN: 20 mg/dL (ref 6–20)
CHLORIDE: 109 mmol/L (ref 101–111)
CO2: 28 mmol/L (ref 22–32)
Calcium: 9.2 mg/dL (ref 8.9–10.3)
Creatinine, Ser: 0.85 mg/dL (ref 0.44–1.00)
GFR calc Af Amer: >60 mL/min (ref >60)
GFR calc non Af Amer: >60 mL/min (ref >60)
GLUCOSE: 92 mg/dL (ref 65–99)
Potassium: 4.1 mmol/L (ref 3.5–5.1)
Sodium: 143 mmol/L (ref 135–145)

## 2016-11-28 LAB — I-STAT TROPONIN, ED: Troponin i, poc: 0 ng/mL (ref 0.00–0.08)

## 2016-11-28 LAB — BRAIN NATRIURETIC PEPTIDE: B Natriuretic Peptide: 32 pg/mL (ref 0.0–100.0)

## 2016-11-28 MED ORDER — HYDRALAZINE HCL 20 MG/ML IJ SOLN: 10.0000 mg | Freq: Once | INTRAMUSCULAR | Status: DC

## 2016-11-28 MED ORDER — CLONIDINE HCL 0.2 MG PO TABS
0.2000 mg | ORAL_TABLET | Freq: Once | ORAL | Status: AC
  Administered 2016-11-28: 0.2 mg via ORAL
  Filled 2016-11-28: qty 1

## 2016-12-03 ENCOUNTER — Inpatient Hospital Stay (HOSPITAL_COMMUNITY)
Admission: AD | Admit: 2016-12-03 | Discharge: 2016-12-07 | DRG: 305 | Disposition: A | Discharge status: Home / Self Care | Payer: Medicare Other | Source: Ambulatory Visit | Attending: Vascular Surgery | Admitting: Vascular Surgery

## 2016-12-03 ENCOUNTER — Encounter (HOSPITAL_COMMUNITY): Payer: Self-pay | Admitting: General Practice

## 2016-12-03 ENCOUNTER — Encounter (HOSPITAL_COMMUNITY)
Admission: AD | Disposition: A | Discharge status: Home / Self Care | Payer: Self-pay | Source: Ambulatory Visit | Attending: Vascular Surgery

## 2016-12-03 DIAGNOSIS — I10 Essential (primary) hypertension: Principal | ICD-10-CM

## 2016-12-03 DIAGNOSIS — Z8614 Personal history of Methicillin resistant Staphylococcus aureus infection: Secondary | ICD-10-CM

## 2016-12-03 DIAGNOSIS — Z720 Tobacco use: Secondary | ICD-10-CM

## 2016-12-03 DIAGNOSIS — Z8711 Personal history of peptic ulcer disease: Secondary | ICD-10-CM

## 2016-12-03 DIAGNOSIS — E1142 Type 2 diabetes mellitus with diabetic polyneuropathy: Secondary | ICD-10-CM | POA: Diagnosis present

## 2016-12-03 DIAGNOSIS — G35 Multiple sclerosis: Secondary | ICD-10-CM | POA: Diagnosis not present

## 2016-12-03 DIAGNOSIS — Z9114 Patient's other noncompliance with medication regimen: Secondary | ICD-10-CM

## 2016-12-03 DIAGNOSIS — G4700 Insomnia, unspecified: Secondary | ICD-10-CM | POA: Diagnosis present

## 2016-12-03 DIAGNOSIS — F329 Major depressive disorder, single episode, unspecified: Secondary | ICD-10-CM | POA: Diagnosis present

## 2016-12-03 DIAGNOSIS — Z8249 Family history of ischemic heart disease and other diseases of the circulatory system: Secondary | ICD-10-CM

## 2016-12-03 DIAGNOSIS — G8929 Other chronic pain: Secondary | ICD-10-CM | POA: Diagnosis not present

## 2016-12-03 DIAGNOSIS — E669 Obesity, unspecified: Secondary | ICD-10-CM | POA: Diagnosis present

## 2016-12-03 DIAGNOSIS — I69398 Other sequelae of cerebral infarction: Secondary | ICD-10-CM

## 2016-12-03 DIAGNOSIS — I69351 Hemiplegia and hemiparesis following cerebral infarction affecting right dominant side: Secondary | ICD-10-CM

## 2016-12-03 DIAGNOSIS — Z7902 Long term (current) use of antithrombotics/antiplatelets: Secondary | ICD-10-CM

## 2016-12-03 DIAGNOSIS — G2581 Restless legs syndrome: Secondary | ICD-10-CM | POA: Diagnosis present

## 2016-12-03 DIAGNOSIS — Z66 Do not resuscitate: Secondary | ICD-10-CM | POA: Diagnosis present

## 2016-12-03 DIAGNOSIS — Z6832 Body mass index (BMI) 32.0-32.9, adult: Secondary | ICD-10-CM | POA: Diagnosis not present

## 2016-12-03 DIAGNOSIS — I25118 Atherosclerotic heart disease of native coronary artery with other forms of angina pectoris: Secondary | ICD-10-CM | POA: Diagnosis present

## 2016-12-03 DIAGNOSIS — F419 Anxiety disorder, unspecified: Secondary | ICD-10-CM | POA: Diagnosis not present

## 2016-12-03 DIAGNOSIS — G43909 Migraine, unspecified, not intractable, without status migrainosus: Secondary | ICD-10-CM | POA: Diagnosis not present

## 2016-12-03 DIAGNOSIS — Z823 Family history of stroke: Secondary | ICD-10-CM

## 2016-12-03 DIAGNOSIS — J449 Chronic obstructive pulmonary disease, unspecified: Secondary | ICD-10-CM | POA: Diagnosis not present

## 2016-12-03 DIAGNOSIS — I2510 Atherosclerotic heart disease of native coronary artery without angina pectoris: Secondary | ICD-10-CM | POA: Diagnosis not present

## 2016-12-03 DIAGNOSIS — M545 Low back pain: Secondary | ICD-10-CM | POA: Diagnosis present

## 2016-12-03 DIAGNOSIS — I639 Cerebral infarction, unspecified: Secondary | ICD-10-CM | POA: Diagnosis not present

## 2016-12-03 DIAGNOSIS — J410 Simple chronic bronchitis: Secondary | ICD-10-CM | POA: Diagnosis not present

## 2016-12-03 DIAGNOSIS — F17210 Nicotine dependence, cigarettes, uncomplicated: Secondary | ICD-10-CM | POA: Diagnosis present

## 2016-12-03 DIAGNOSIS — K219 Gastro-esophageal reflux disease without esophagitis: Secondary | ICD-10-CM | POA: Diagnosis not present

## 2016-12-03 DIAGNOSIS — M810 Age-related osteoporosis without current pathological fracture: Secondary | ICD-10-CM | POA: Diagnosis present

## 2016-12-03 DIAGNOSIS — F411 Generalized anxiety disorder: Secondary | ICD-10-CM | POA: Diagnosis present

## 2016-12-03 DIAGNOSIS — E876 Hypokalemia: Secondary | ICD-10-CM | POA: Diagnosis present

## 2016-12-03 DIAGNOSIS — Z825 Family history of asthma and other chronic lower respiratory diseases: Secondary | ICD-10-CM

## 2016-12-03 DIAGNOSIS — Z806 Family history of leukemia: Secondary | ICD-10-CM

## 2016-12-03 DIAGNOSIS — I252 Old myocardial infarction: Secondary | ICD-10-CM

## 2016-12-03 DIAGNOSIS — H5462 Unqualified visual loss, left eye, normal vision right eye: Secondary | ICD-10-CM | POA: Diagnosis not present

## 2016-12-03 DIAGNOSIS — Z880 Allergy status to penicillin: Secondary | ICD-10-CM

## 2016-12-03 HISTORY — DX: Migraine, unspecified, not intractable, without status migrainosus: G43.909

## 2016-12-03 HISTORY — DX: Claustrophobia: F40.240

## 2016-12-03 HISTORY — DX: Post-traumatic stress disorder, unspecified: F43.10

## 2016-12-03 HISTORY — DX: Iron deficiency anemia, unspecified: D50.9

## 2016-12-03 HISTORY — DX: Low back pain: M54.5

## 2016-12-03 HISTORY — DX: Other chronic pain: G89.29

## 2016-12-03 HISTORY — DX: Low back pain, unspecified: M54.50

## 2016-12-03 HISTORY — DX: Personal history of other diseases of the digestive system: Z87.19

## 2016-12-03 HISTORY — DX: Unspecified chronic bronchitis: J42

## 2016-12-03 HISTORY — DX: Headache: R51

## 2016-12-03 HISTORY — DX: Chronic kidney disease, unspecified: N18.9

## 2016-12-03 HISTORY — DX: Cardiac murmur, unspecified: R01.1

## 2016-12-03 HISTORY — PX: RENAL ANGIOGRAPHY: CATH118260

## 2016-12-03 HISTORY — DX: Anemia, unspecified: D64.9

## 2016-12-03 HISTORY — PX: ABDOMINAL AORTOGRAM: CATH118222

## 2016-12-03 HISTORY — DX: Unspecified osteoarthritis, unspecified site: M19.90

## 2016-12-03 HISTORY — DX: Type 2 diabetes mellitus without complications: E11.9

## 2016-12-03 HISTORY — DX: Headache, unspecified: R51.9

## 2016-12-03 LAB — CBC
HCT: 44.2 % (ref 36.0–46.0)
Hemoglobin: 14.1 g/dL (ref 12.0–15.0)
MCH: 28.7 pg (ref 26.0–34.0)
MCHC: 31.9 g/dL (ref 30.0–36.0)
MCV: 90 fL (ref 78.0–100.0)
PLATELETS: 232 10*3/uL (ref 150–400)
RBC: 4.91 MIL/uL (ref 3.87–5.11)
RDW: 15.3 % (ref 11.5–15.5)
WBC: 8.3 10*3/uL (ref 4.0–10.5)

## 2016-12-03 LAB — POCT I-STAT, CHEM 8
BUN: 19 mg/dL (ref 6–20)
CALCIUM ION: 1.1 mmol/L — AB (ref 1.15–1.40)
CREATININE: 0.8 mg/dL (ref 0.44–1.00)
Chloride: 107 mmol/L (ref 101–111)
GLUCOSE: 103 mg/dL — AB (ref 65–99)
HCT: 43 % (ref 36.0–46.0)
HEMOGLOBIN: 14.6 g/dL (ref 12.0–15.0)
Potassium: 4.5 mmol/L (ref 3.5–5.1)
Sodium: 141 mmol/L (ref 135–145)
TCO2: 27 mmol/L (ref 0–100)

## 2016-12-03 LAB — URINALYSIS, ROUTINE W REFLEX MICROSCOPIC
Bilirubin Urine: NEGATIVE
GLUCOSE, UA: NEGATIVE mg/dL
KETONES UR: NEGATIVE mg/dL
LEUKOCYTES UA: NEGATIVE
NITRITE: NEGATIVE
PROTEIN: 30 mg/dL — AB
Specific Gravity, Urine: 1.02 (ref 1.005–1.030)
pH: 6.5 (ref 5.0–8.0)

## 2016-12-03 LAB — GLUCOSE, CAPILLARY
GLUCOSE-CAPILLARY: 132 mg/dL — AB (ref 65–99)
Glucose-Capillary: 163 mg/dL — ABNORMAL HIGH (ref 65–99)

## 2016-12-03 LAB — COMPREHENSIVE METABOLIC PANEL
ALT: 20 U/L (ref 14–54)
AST: 26 U/L (ref 15–41)
Albumin: 3.7 g/dL (ref 3.5–5.0)
Alkaline Phosphatase: 69 U/L (ref 38–126)
Anion gap: 9 (ref 5–15)
BUN: 11 mg/dL (ref 6–20)
CHLORIDE: 110 mmol/L (ref 101–111)
CO2: 20 mmol/L — ABNORMAL LOW (ref 22–32)
CREATININE: 0.79 mg/dL (ref 0.44–1.00)
Calcium: 8.9 mg/dL (ref 8.9–10.3)
Glucose, Bld: 100 mg/dL — ABNORMAL HIGH (ref 65–99)
POTASSIUM: 3.6 mmol/L (ref 3.5–5.1)
Sodium: 139 mmol/L (ref 135–145)
Total Bilirubin: 0.6 mg/dL (ref 0.3–1.2)
Total Protein: 6.8 g/dL (ref 6.5–8.1)

## 2016-12-03 LAB — URINALYSIS, MICROSCOPIC (REFLEX)

## 2016-12-03 LAB — SURGICAL PCR SCREEN
MRSA, PCR: POSITIVE — AB
Staphylococcus aureus: POSITIVE — AB

## 2016-12-03 LAB — PROTIME-INR
INR: 1.16
PROTHROMBIN TIME: 14.8 s (ref 11.4–15.2)

## 2016-12-03 SURGERY — ABDOMINAL AORTOGRAM
Anesthesia: LOCAL

## 2016-12-03 MED ORDER — INSULIN ASPART 100 UNIT/ML ~~LOC~~ SOLN
0.0000 [IU] | Freq: Three times a day (TID) | SUBCUTANEOUS | Status: DC
  Administered 2016-12-04 – 2016-12-05 (×2): 1 [IU] via SUBCUTANEOUS
  Administered 2016-12-07: 2 [IU] via SUBCUTANEOUS

## 2016-12-03 MED ORDER — POTASSIUM CHLORIDE CRYS ER 20 MEQ PO TBCR
20.0000 meq | EXTENDED_RELEASE_TABLET | Freq: Once | ORAL | Status: DC
  Filled 2016-12-03: qty 1

## 2016-12-03 MED ORDER — CHLORTHALIDONE 25 MG PO TABS
25.0000 mg | ORAL_TABLET | Freq: Two times a day (BID) | ORAL | Status: DC
  Filled 2016-12-03 (×9): qty 1

## 2016-12-03 MED ORDER — HYDRALAZINE HCL 20 MG/ML IJ SOLN
INTRAMUSCULAR | Status: AC
  Filled 2016-12-03: qty 1

## 2016-12-03 MED ORDER — LOSARTAN POTASSIUM 50 MG PO TABS
50.0000 mg | ORAL_TABLET | Freq: Once | ORAL | Status: AC
  Administered 2016-12-03: 50 mg via ORAL
  Filled 2016-12-03: qty 1

## 2016-12-03 MED ORDER — ASPIRIN-ACETAMINOPHEN-CAFFEINE 250-250-65 MG PO TABS
2.0000 | ORAL_TABLET | Freq: Every day | ORAL | Status: DC | PRN
  Filled 2016-12-03: qty 2

## 2016-12-03 MED ORDER — MIDAZOLAM HCL 2 MG/2ML IJ SOLN
INTRAMUSCULAR | Status: DC | PRN
  Administered 2016-12-03 (×2): 1 mg via INTRAVENOUS

## 2016-12-03 MED ORDER — CLOPIDOGREL BISULFATE 75 MG PO TABS: 75.0000 mg | ORAL_TABLET | Freq: Once | ORAL | Status: DC

## 2016-12-03 MED ORDER — LIDOCAINE HCL (PF) 1 % IJ SOLN
INTRAMUSCULAR | Status: AC
  Filled 2016-12-03: qty 30

## 2016-12-03 MED ORDER — INSULIN ASPART 100 UNIT/ML ~~LOC~~ SOLN: 0.0000 [IU] | Freq: Every day | SUBCUTANEOUS | Status: DC

## 2016-12-03 MED ORDER — OXYCODONE-ACETAMINOPHEN 7.5-325 MG PO TABS: 1.0000 | ORAL_TABLET | Freq: Four times a day (QID) | ORAL | Status: DC | PRN

## 2016-12-03 MED ORDER — ALUM & MAG HYDROXIDE-SIMETH 200-200-20 MG/5ML PO SUSP: 15.0000 mL | ORAL | Status: DC | PRN

## 2016-12-03 MED ORDER — MIDAZOLAM HCL 2 MG/2ML IJ SOLN
INTRAMUSCULAR | Status: AC
  Filled 2016-12-03: qty 2

## 2016-12-03 MED ORDER — HYDRALAZINE HCL 20 MG/ML IJ SOLN: 5.0000 mg | INTRAMUSCULAR | Status: DC | PRN

## 2016-12-03 MED ORDER — GABAPENTIN 600 MG PO TABS
600.0000 mg | ORAL_TABLET | Freq: Four times a day (QID) | ORAL | Status: DC
  Administered 2016-12-03 – 2016-12-06 (×12): 600 mg via ORAL
  Filled 2016-12-03 (×12): qty 1

## 2016-12-03 MED ORDER — METOPROLOL TARTRATE 5 MG/5ML IV SOLN: 2.0000 mg | INTRAVENOUS | Status: DC | PRN

## 2016-12-03 MED ORDER — ONDANSETRON HCL 4 MG/2ML IJ SOLN: 4.0000 mg | Freq: Four times a day (QID) | INTRAMUSCULAR | Status: DC | PRN

## 2016-12-03 MED ORDER — PANTOPRAZOLE SODIUM 40 MG PO TBEC: 40.0000 mg | DELAYED_RELEASE_TABLET | Freq: Every day | ORAL | Status: DC

## 2016-12-03 MED ORDER — AMLODIPINE BESYLATE 10 MG PO TABS
10.0000 mg | ORAL_TABLET | Freq: Every day | ORAL | Status: DC
  Administered 2016-12-04 – 2016-12-06 (×2): 10 mg via ORAL
  Filled 2016-12-03 (×3): qty 1

## 2016-12-03 MED ORDER — DIAZEPAM 5 MG PO TABS
5.0000 mg | ORAL_TABLET | Freq: Three times a day (TID) | ORAL | Status: DC | PRN
  Administered 2016-12-05 – 2016-12-06 (×2): 5 mg via ORAL
  Filled 2016-12-03 (×2): qty 1

## 2016-12-03 MED ORDER — HYDRALAZINE HCL 20 MG/ML IJ SOLN: 10.0000 mg | Freq: Four times a day (QID) | INTRAMUSCULAR | Status: DC | PRN

## 2016-12-03 MED ORDER — DIAZEPAM 5 MG PO TABS
5.0000 mg | ORAL_TABLET | Freq: Once | ORAL | Status: AC
  Administered 2016-12-03: 5 mg via ORAL

## 2016-12-03 MED ORDER — HEPARIN (PORCINE) IN NACL 2-0.9 UNIT/ML-% IJ SOLN
INTRAMUSCULAR | Status: AC | PRN
  Administered 2016-12-03: 1000 mL

## 2016-12-03 MED ORDER — AMLODIPINE BESYLATE 5 MG PO TABS
10.0000 mg | ORAL_TABLET | Freq: Once | ORAL | Status: AC
  Administered 2016-12-03: 10 mg via ORAL

## 2016-12-03 MED ORDER — MUPIROCIN 2 % EX OINT
1.0000 "application " | TOPICAL_OINTMENT | Freq: Two times a day (BID) | CUTANEOUS | Status: DC
  Administered 2016-12-03: 1 via NASAL
  Filled 2016-12-03: qty 22

## 2016-12-03 MED ORDER — NICOTINE 21 MG/24HR TD PT24
21.0000 mg | MEDICATED_PATCH | Freq: Every day | TRANSDERMAL | Status: DC
  Administered 2016-12-03 – 2016-12-05 (×3): 21 mg via TRANSDERMAL
  Filled 2016-12-03 (×3): qty 1

## 2016-12-03 MED ORDER — SODIUM CHLORIDE 0.9 % IV SOLN
INTRAVENOUS | Status: DC
  Administered 2016-12-03: 06:00:00 via INTRAVENOUS

## 2016-12-03 MED ORDER — PANTOPRAZOLE SODIUM 40 MG PO TBEC
40.0000 mg | DELAYED_RELEASE_TABLET | Freq: Every day | ORAL | Status: DC
  Administered 2016-12-04 – 2016-12-05 (×2): 40 mg via ORAL
  Filled 2016-12-03 (×2): qty 1

## 2016-12-03 MED ORDER — HYDRALAZINE HCL 20 MG/ML IJ SOLN: 10.0000 mg | INTRAMUSCULAR | Status: DC | PRN

## 2016-12-03 MED ORDER — HEPARIN (PORCINE) IN NACL 2-0.9 UNIT/ML-% IJ SOLN
INTRAMUSCULAR | Status: AC
  Filled 2016-12-03: qty 1000

## 2016-12-03 MED ORDER — CLOPIDOGREL BISULFATE 75 MG PO TABS
75.0000 mg | ORAL_TABLET | Freq: Every day | ORAL | Status: DC
  Administered 2016-12-04 – 2016-12-06 (×3): 75 mg via ORAL
  Filled 2016-12-03 (×3): qty 1

## 2016-12-03 MED ORDER — ZOLPIDEM TARTRATE 5 MG PO TABS
5.0000 mg | ORAL_TABLET | Freq: Every day | ORAL | Status: DC
  Administered 2016-12-03 – 2016-12-06 (×4): 5 mg via ORAL
  Filled 2016-12-03 (×4): qty 1

## 2016-12-03 MED ORDER — ROPINIROLE HCL 1 MG PO TABS
4.0000 mg | ORAL_TABLET | Freq: Every day | ORAL | Status: DC
  Administered 2016-12-03 – 2016-12-06 (×4): 4 mg via ORAL
  Filled 2016-12-03 (×4): qty 4

## 2016-12-03 MED ORDER — FENTANYL CITRATE (PF) 100 MCG/2ML IJ SOLN
INTRAMUSCULAR | Status: AC
  Filled 2016-12-03: qty 2

## 2016-12-03 MED ORDER — LABETALOL HCL 5 MG/ML IV SOLN: 10.0000 mg | INTRAVENOUS | Status: DC | PRN

## 2016-12-03 MED ORDER — HYDRALAZINE HCL 20 MG/ML IJ SOLN
10.0000 mg | INTRAMUSCULAR | Status: DC | PRN
  Administered 2016-12-03: 10 mg via INTRAVENOUS

## 2016-12-03 MED ORDER — CLOPIDOGREL BISULFATE 75 MG PO TABS
ORAL_TABLET | ORAL | Status: AC
  Administered 2016-12-03: 75 mg
  Filled 2016-12-03: qty 1

## 2016-12-03 MED ORDER — FENTANYL CITRATE (PF) 100 MCG/2ML IJ SOLN
INTRAMUSCULAR | Status: DC | PRN
  Administered 2016-12-03: 25 ug via INTRAVENOUS

## 2016-12-03 MED ORDER — CHLORHEXIDINE GLUCONATE CLOTH 2 % EX PADS
6.0000 | MEDICATED_PAD | Freq: Every day | CUTANEOUS | Status: DC
  Administered 2016-12-06: 6 via TOPICAL

## 2016-12-03 MED ORDER — GUAIFENESIN-DM 100-10 MG/5ML PO SYRP: 15.0000 mL | ORAL_SOLUTION | ORAL | Status: DC | PRN

## 2016-12-03 MED ORDER — ENOXAPARIN SODIUM 40 MG/0.4ML ~~LOC~~ SOLN
40.0000 mg | SUBCUTANEOUS | Status: DC
  Filled 2016-12-03: qty 0.4

## 2016-12-03 MED ORDER — LIDOCAINE HCL (PF) 1 % IJ SOLN
INTRAMUSCULAR | Status: DC | PRN
  Administered 2016-12-03: 1 mL via INTRADERMAL

## 2016-12-03 MED ORDER — DIAZEPAM 5 MG PO TABS
ORAL_TABLET | ORAL | Status: AC
  Filled 2016-12-03: qty 1

## 2016-12-03 MED ORDER — LOSARTAN POTASSIUM 50 MG PO TABS: 50.0000 mg | ORAL_TABLET | Freq: Two times a day (BID) | ORAL | Status: DC

## 2016-12-03 MED ORDER — PHENOL 1.4 % MT LIQD: 1.0000 | OROMUCOSAL | Status: DC | PRN

## 2016-12-03 SURGICAL SUPPLY — 8 items
CATH ANGIO 5F PIGTAIL 65CM (CATHETERS) IMPLANT
KIT MICROINTRODUCER STIFF 5F (SHEATH) IMPLANT
KIT PV (KITS) IMPLANT
SHEATH PINNACLE 6F 10CM (SHEATH) IMPLANT
SYR MEDRAD MARK V 150ML (SYRINGE) IMPLANT
TRANSDUCER W/STOPCOCK (MISCELLANEOUS) IMPLANT
TRAY PV CATH (CUSTOM PROCEDURE TRAY) IMPLANT
WIRE HITORQ VERSACORE ST 145CM (WIRE) IMPLANT

## 2016-12-03 NOTE — Interval H&P Note (Signed)
History and Physical Interval Note:  12/03/2016 7:26 AM  Lonell Grandchild  has presented today for surgery, with the diagnosis of renal artery stenosis  The various methods of treatment have been discussed with the patient and family. After consideration of risks, benefits and other options for treatment, the patient has consented to  Procedure(s): Abdominal Aortogram (N/A) Renal Angiography (N/A) as a surgical intervention .  The patient's history has been reviewed, patient examined, no change in status, stable for surgery.  I have reviewed the patient's chart and labs.  Questions were answered to the patient's satisfaction.     Ruta Hinds

## 2016-12-03 NOTE — Progress Notes (Addendum)
Patient states she had 6 loose stools yesterday and felt it was from her nerves and anxiety over procedure.  Patient states that she has had 3 more loose stools since arriving to [facility] [place]. Patient states she took immodium yesterday without success/relief. Patient has been asked to collect a sample for assessment.  Patient not sure whether noted blood or dark/tarry stool. Will make MD aware. Pt resting with call bell within reach.  Will continue to monitor. Payton Emerald, RN

## 2016-12-03 NOTE — H&P (Signed)
Patient name: Katrina [name] MRN: [id number]        DOB: 05/27/1961          Sex: female   REASON FOR ADMISSION:    Poorly controlled blood pressure.   HPI:   Katrina [name] is a 56 y.o. female, Who I saw in consultation on 11/04/2016. She was Referred for evaluation of poorly controlled blood pressure. She tells me that she has had very poorly controlled blood pressure really since 2008. She tells me that her blood pressure systolic consistently runs above 200. She is on multiple medications for blood pressure and most recently had a renal artery duplex which suggested possible bilateral renal artery stenoses. The patient was sent for vascular consultation.  The patient denies any headaches, hematuria or flank pain.  She is on Norvasc, Catapres patch, and Cozaar for her blood pressure which is still poorly controlled.   Her risk factors for peripheral vascular disease include hypertension, heavy tobacco use, and a family history of premature cardiovascular disease. She denies any history of diabetes or hypercholesterolemia.  She does tell me that she had a silent heart attack in 2000. She has stable angina. She had a heart catheter couple years ago according to the patient. I cannot find records of this cath I believe this was done elsewhere.  She is on Plavix and is not on aspirin.  She smokes 2 packs per day and has been smoking for over 40 years.  She came in today for aortogram and possible bilateral renal artery angioplasty and stenting. Her blood pressure was poorly controlled however we felt it was indicated to proceed. However. She was extremely anxious and unable to hold still. I did not think that the procedure could be performed safely. For this reason, I recommended admission to the hospital for blood pressure control and we will reschedule her for her procedure on Monday in the operating room under general anesthesia.      Past Medical History:  Diagnosis Date  .  Allergy   . Anginal pain ([place])    - none currently  . Anxiety    takes Valium daily as needed  . Asthma    has inhalers but doesn't use  . CAD (coronary artery disease)    Moderate nonobstructive 2012-2013, [place]; No angiographic evidence of CAD 01/31/15 [facility]  . Chronic back pain    budlging disc   . COPD (chronic obstructive pulmonary disease) ([place])   . Depression   . Diverticulitis   . Family history of adverse reaction to anesthesia    daughter gets extremely sick   . GERD (gastroesophageal reflux disease)    takes Dexilant daily  . History of blood transfusion 1975/1976   no abnormal reaction noted  . History of bronchitis 06/2015  . History of colitis   . History of colon polyps    benign  . History of gastric ulcer   . History of migraine    last one a few days ago  . History of MRSA infection 2017  . Hyperlipidemia    was on meds but has been off over a yr  . Hypertension    takes Amlodipine and Maxzide daily  . Insomnia    takes Ambien nightly  . Lung nodules   . Microscopic hematuria   . MS (multiple sclerosis) ([place])    questionable per pt  . Osteoporosis   . Peripheral neuropathy    weakness,numbness,and tingling. Takes Gabapentin daily  . Pneumonia 06/2015  .  Restless leg syndrome    takes Requip daily  . Stroke ([place])    x 2 --takes Plavix daily.Left sided weakness         Family History  Problem Relation Age of Onset  . Coronary artery disease Father   . Emphysema Father   . Heart attack Father   . Stroke Father   . Cancer Father        Unsure of type   . Allergic rhinitis Father   . Depression Mother   . Cancer Mother        skin cancer  . Bipolar disorder Brother   . Drug abuse Brother   . Cancer Brother        leukemia  . Bipolar disorder Daughter   . Allergic rhinitis Daughter   . Asthma Daughter   . Urticaria Daughter   . Diabetes Maternal Grandmother   . Stomach  cancer Maternal Grandfather   . Cancer Maternal Grandfather        stomach   . Angioedema Neg Hx   . Atopy Neg Hx   . Eczema Neg Hx   . Immunodeficiency Neg Hx     SOCIAL HISTORY: Social History        Social History  . Marital status: Divorced    Spouse name: N/A  . Number of children: 3  . Years of education: Some college        Occupational History  .      Disability for back pain         Social History Main Topics  . Smoking status: Current Every Day Smoker    Packs/day: 2.00    Years: 44.00    Types: Cigarettes    Start date: 03/04/1971  . Smokeless tobacco: Never Used  . Alcohol use No     Comment: 10-06-15 per pt no   . Drug use: No     Comment: 10-06-15 per pt no  . Sexual activity: Not Currently    Birth control/ protection: Surgical       Other Topics Concern  . Not on file      Social History Narrative   Lives at home with her daughter.   Right-handed.   1-2 cups coffee in the morning and 2 sodas per day.            Allergies  Allergen Reactions  . Other Anaphylaxis and Swelling    [place]  . Penicillins Anaphylaxis    Has patient had a PCN reaction causing immediate rash, facial/tongue/throat swelling, SOB or lightheadedness with hypotension: Yes Has patient had a PCN reaction causing severe rash involving mucus membranes or skin necrosis: Yes Has patient had a PCN reaction that required hospitalization Yes Has patient had a PCN reaction occurring within the last 10 years: No If all of the above answers are "NO", then may proceed with Cephalosporin use.   . Zithromax [Azithromycin] Anaphylaxis  . Aspirin Other (See Comments)    Due to stomach ulcers.   . Pineapple Rash  . Strawberry Extract Rash and Hives  . Wheat Bran Other (See Comments)    Sneezing, rhinorrhea  . Yeast-Related Products Other (See Comments)    Sneezing, rhinorrhea  . Aspartame And Phenylalanine Palpitations  .  Mushroom Extract Complex Rash  . Nicardipine Nausea And Vomiting and Other (See Comments)    shaking          Current Outpatient Prescriptions  Medication Sig Dispense Refill  . albuterol (PROVENTIL HFA;VENTOLIN  HFA) 108 (90 Base) MCG/ACT inhaler Inhale 1 puff into the lungs every 4 (four) hours as needed for wheezing.    Marland Kitchen amLODipine (NORVASC) 10 MG tablet Take 1 tablet (10 mg total) by mouth daily. 30 tablet 6  . aspirin-acetaminophen-caffeine (EXCEDRIN MIGRAINE) 250-250-65 MG tablet Take 2 tablets by mouth every 6 (six) hours as needed for migraine.    . chlorthalidone (HYGROTON) 25 MG tablet Take 25 mg by mouth daily. Take 1 tablet by mouth 2 times daily    . cloNIDine (CATAPRES - DOSED IN MG/24 HR) 0.3 mg/24hr patch Place 0.3 mg onto the skin once a week. Apply 1 patch to the skin weekly.    . clopidogrel (PLAVIX) 75 MG tablet Take 75 mg by mouth daily.    Marland Kitchen dexlansoprazole (DEXILANT) 60 MG capsule Take 1 tab by mouth every morning. 30 capsule 1  . diazepam (VALIUM) 5 MG tablet Take 1 tablet (5 mg total) by mouth every 8 (eight) hours as needed for anxiety. (Patient taking differently: Take 5 mg by mouth daily as needed for anxiety. ) 30 tablet 0  . gabapentin (NEURONTIN) 600 MG tablet Take 600 mg by mouth 4 (four) times daily.    Marland Kitchen losartan (COZAAR) 50 MG tablet Take 50 mg by mouth 2 (two) times daily.   3  . methocarbamol (ROBAXIN) 750 MG tablet Take 750 mg by mouth 2 (two) times daily as needed for muscle spasms.  1  . oxyCODONE-acetaminophen (PERCOCET) 7.5-325 MG tablet Take 1 tablet by mouth every 6 (six) hours as needed (for pain.).     Marland Kitchen rOPINIRole (REQUIP) 4 MG tablet Take 4 mg by mouth at bedtime.    Marland Kitchen zolpidem (AMBIEN) 10 MG tablet Take 10 mg by mouth at bedtime as needed for sleep.     Marland Kitchen albuterol (PROVENTIL) (2.5 MG/3ML) 0.083% nebulizer solution Take 3 mLs by nebulization daily as needed for shortness of breath.    . fluconazole (DIFLUCAN) 200 MG  tablet Take 2 tablets(400 mg) x one dose, then take 1 tablet (200 mg) daily until finished (Patient not taking: Reported on 11/04/2016) 15 tablet 0  . lidocaine (XYLOCAINE) 2 % solution Take 5 cc's by mouth 15 minutes before a meal. (Patient not taking: Reported on 11/04/2016) 100 mL 1  . ondansetron (ZOFRAN ODT) 4 MG disintegrating tablet Dissolve 1 tablet on the tongue every 6 hours as needed for nausea. (Patient not taking: Reported on 11/04/2016) 40 tablet 0  . pregabalin (LYRICA) 75 MG capsule Take 75 mg by mouth 2 (two) times daily.    . Suvorexant (BELSOMRA) 10 MG TABS Take 10 mg by mouth. Take 1 tab within 30 minutes of going to bed.    . umeclidinium-vilanterol (ANORO ELLIPTA) 62.5-25 MCG/INH AEPB Inhale 1 puff into the lungs daily. Inhale 1 puff daily.              Current Facility-Administered Medications  Medication Dose Route Frequency Provider Last Rate Last Dose  . 0.9 %  sodium chloride infusion  500 mL Intravenous Continuous Armbruster, Renelda Loma, MD        REVIEW OF SYSTEMS:  [X]  denotes positive finding, [ ]  denotes negative finding Cardiac  Comments:  Chest pain or chest pressure: X   Shortness of breath upon exertion: X   Short of breath when lying flat: X   Irregular heart rhythm:        Vascular    Pain in calf, thigh, or hip brought on by ambulation: X  Pain in feet at night that wakes you up from your sleep:     Blood clot in your veins:    Leg swelling:         Pulmonary    Oxygen at home:    Productive cough:  X   Wheezing:         Neurologic    Sudden weakness in arms or legs:     Sudden numbness in arms or legs:     Sudden onset of difficulty speaking or slurred speech:    Temporary loss of vision in one eye:     Problems with dizziness:         Gastrointestinal    Blood in stool:     Vomited blood:         Genitourinary    Burning when urinating:     Blood in urine:         Psychiatric    Major depression:         Hematologic    Bleeding problems:    Problems with blood clotting too easily:        Skin    Rashes or ulcers:        Constitutional    Fever or chills:     PHYSICAL EXAM:       Vitals:   11/04/16 1046 11/04/16 1050  BP: (!) 187/102 (!) 198/124  Pulse: 96 91  Resp: 16   Temp: 98.5 F (36.9 C)   SpO2: 96%   Weight: 206 lb (93.4 kg)   Height: 5' 6.5" (1.689 m)   Body mass index is 32.75 kg/m.  Vitals:   12/03/16 0556 12/03/16 0826  BP: (!) 207/122 (!) 216/89  Pulse: 87   Temp: 98.7 F (37.1 C)      GENERAL: The patient is a well-nourished female, in no acute distress. The vital signs are documented above. CARDIAC: There is a regular rate and rhythm.  VASCULAR: I do not detect carotid bruits. She does have palpable femoral pulses although they are somewhat difficult to palpate because of her obesity. She has palpable popliteal, dorsalis pedis, and posterior tibial pulses bilaterally. She has mild bilateral lower extremity swelling. PULMONARY: There is good air exchange bilaterally without wheezing or rales. ABDOMEN: Soft and non-tender with normal pitched bowel sounds.  MUSCULOSKELETAL: There are no major deformities or cyanosis. NEUROLOGIC: No focal weakness or paresthesias are detected. SKIN: There are no ulcers or rashes noted. PSYCHIATRIC: The patient has a normal affect.  DATA:    RENAL DUPLEX: I have reviewed the renal artery duplex scan that was done on 09/03/2016. The right kidney measured 11 cm in length. The left kidney measured 11.5 cm in length. There were elevated velocities in the proximal renal arteries bilaterally.  LABS: Most recent labs show a GFR of 87.  Her creatinine today is 0.8.  MEDICAL ISSUES:   POORLY CONTROLLED BLOOD PRESSURE: Based on her renal artery duplex scan, and history, this patient may have bilateral renal artery stenoses. We were unable  to perform her procedure safely today as she was unable to hold still in the peripheral vascular lab. We will admit her to the hospital for blood pressure control and schedule her for the procedure in the operating room on Monday under general anesthesia. I have discussed this with the patient and her daughter.   Deitra Mayo [facility] [place]

## 2016-12-03 NOTE — Progress Notes (Signed)
Gwynne Edinger notified of pt's BP

## 2016-12-03 NOTE — H&P (View-Only) (Signed)
Patient name: Katrina [name] MRN: [id number] DOB: [birth year] Sex: female   REASON FOR CONSULT:    Poorly controlled blood pressure. The physician requesting the consult is Dr. Hollie Salk.   HPI:   Katrina [name] is a 56 y.o. female, Referred for evaluation of poorly controlled blood pressure. She tells me that she has had very poorly controlled blood pressure really since 2008. She tells me that her blood pressure systolic consistently runs above 200. She is on multiple medications for blood pressure and most recently had a renal artery duplex which suggested possible bilateral renal artery stenoses. The patient was sent for vascular consultation.  The patient denies any headaches, hematuria or flank pain.  She is on Norvasc, Catapres patch, and Cozaar for her blood pressure which is still poorly controlled.   Her risk factors for peripheral vascular disease include hypertension, heavy tobacco use, and a family history of premature cardiovascular disease. She denies any history of diabetes or hypercholesterolemia.  She does tell me that she had a silent heart attack in 2000. She has stable angina. She had a heart catheter couple years ago according to the patient. I cannot find records of this cath I believe this was done elsewhere.  She is on Plavix and is not on aspirin.  She smokes 2 packs per day and has been smoking for over 40 years.  Past Medical History:  Diagnosis Date  . Allergy   . Anginal pain ([place])    - none currently  . Anxiety    takes Valium daily as needed  . Asthma    has inhalers but doesn't use  . CAD (coronary artery disease)    Moderate nonobstructive 2012-2013, [place]; No angiographic evidence of CAD 01/31/15 [facility]  . Chronic back pain    budlging disc   . COPD (chronic obstructive pulmonary disease) ([place])   . Depression   . Diverticulitis   . Family history of adverse reaction to anesthesia    daughter gets extremely sick   . GERD (gastroesophageal reflux  disease)    takes Dexilant daily  . History of blood transfusion 1975/1976   no abnormal reaction noted  . History of bronchitis 06/2015  . History of colitis   . History of colon polyps    benign  . History of gastric ulcer   . History of migraine    last one a few days ago  . History of MRSA infection 2017  . Hyperlipidemia    was on meds but has been off over a yr  . Hypertension    takes Amlodipine and Maxzide daily  . Insomnia    takes Ambien nightly  . Lung nodules   . Microscopic hematuria   . MS (multiple sclerosis) ([place])    questionable per pt  . Osteoporosis   . Peripheral neuropathy    weakness,numbness,and tingling. Takes Gabapentin daily  . Pneumonia 06/2015  . Restless leg syndrome    takes Requip daily  . Stroke ([place])    x 2 --takes Plavix daily.Left sided weakness    Family History  Problem Relation Age of Onset  . Coronary artery disease Father   . Emphysema Father   . Heart attack Father   . Stroke Father   . Cancer Father        Unsure of type   . Allergic rhinitis Father   . Depression Mother   . Cancer Mother        skin cancer  . Bipolar disorder  Brother   . Drug abuse Brother   . Cancer Brother        leukemia  . Bipolar disorder Daughter   . Allergic rhinitis Daughter   . Asthma Daughter   . Urticaria Daughter   . Diabetes Maternal Grandmother   . Stomach cancer Maternal Grandfather   . Cancer Maternal Grandfather        stomach   . Angioedema Neg Hx   . Atopy Neg Hx   . Eczema Neg Hx   . Immunodeficiency Neg Hx     SOCIAL HISTORY: Social History   Social History  . Marital status: Divorced    Spouse name: N/A  . Number of children: 3  . Years of education: Some college   Occupational History  .      Disability for back pain   Social History Main Topics  . Smoking status: Current Every Day Smoker    Packs/day: 2.00    Years: 44.00    Types: Cigarettes    Start date: 03/04/1971  . Smokeless tobacco: Never Used  .  Alcohol use No     Comment: 10-06-15 per pt no   . Drug use: No     Comment: 10-06-15 per pt no  . Sexual activity: Not Currently    Birth control/ protection: Surgical   Other Topics Concern  . Not on file   Social History Narrative   Lives at home with her daughter.   Right-handed.   1-2 cups coffee in the morning and 2 sodas per day.       Allergies  Allergen Reactions  . Other Anaphylaxis and Swelling    [place]  . Penicillins Anaphylaxis    Has patient had a PCN reaction causing immediate rash, facial/tongue/throat swelling, SOB or lightheadedness with hypotension: Yes Has patient had a PCN reaction causing severe rash involving mucus membranes or skin necrosis: Yes Has patient had a PCN reaction that required hospitalization Yes Has patient had a PCN reaction occurring within the last 10 years: No If all of the above answers are "NO", then may proceed with Cephalosporin use.   . Zithromax [Azithromycin] Anaphylaxis  . Aspirin Other (See Comments)    Due to stomach ulcers.   . Pineapple Rash  . Strawberry Extract Rash and Hives  . Wheat Bran Other (See Comments)    Sneezing, rhinorrhea  . Yeast-Related Products Other (See Comments)    Sneezing, rhinorrhea  . Aspartame And Phenylalanine Palpitations  . Mushroom Extract Complex Rash  . Nicardipine Nausea And Vomiting and Other (See Comments)    shaking    Current Outpatient Prescriptions  Medication Sig Dispense Refill  . albuterol (PROVENTIL HFA;VENTOLIN HFA) 108 (90 Base) MCG/ACT inhaler Inhale 1 puff into the lungs every 4 (four) hours as needed for wheezing.    Marland Kitchen amLODipine (NORVASC) 10 MG tablet Take 1 tablet (10 mg total) by mouth daily. 30 tablet 6  . aspirin-acetaminophen-caffeine (EXCEDRIN MIGRAINE) 250-250-65 MG tablet Take 2 tablets by mouth every 6 (six) hours as needed for migraine.    . chlorthalidone (HYGROTON) 25 MG tablet Take 25 mg by mouth daily. Take 1 tablet by mouth 2 times daily    . cloNIDine  (CATAPRES - DOSED IN MG/24 HR) 0.3 mg/24hr patch Place 0.3 mg onto the skin once a week. Apply 1 patch to the skin weekly.    . clopidogrel (PLAVIX) 75 MG tablet Take 75 mg by mouth daily.    Marland Kitchen dexlansoprazole (DEXILANT) 60 MG capsule  Take 1 tab by mouth every morning. 30 capsule 1  . diazepam (VALIUM) 5 MG tablet Take 1 tablet (5 mg total) by mouth every 8 (eight) hours as needed for anxiety. (Patient taking differently: Take 5 mg by mouth daily as needed for anxiety. ) 30 tablet 0  . gabapentin (NEURONTIN) 600 MG tablet Take 600 mg by mouth 4 (four) times daily.    Marland Kitchen losartan (COZAAR) 50 MG tablet Take 50 mg by mouth 2 (two) times daily.   3  . methocarbamol (ROBAXIN) 750 MG tablet Take 750 mg by mouth 2 (two) times daily as needed for muscle spasms.  1  . oxyCODONE-acetaminophen (PERCOCET) 7.5-325 MG tablet Take 1 tablet by mouth every 6 (six) hours as needed (for pain.).     Marland Kitchen rOPINIRole (REQUIP) 4 MG tablet Take 4 mg by mouth at bedtime.    Marland Kitchen zolpidem (AMBIEN) 10 MG tablet Take 10 mg by mouth at bedtime as needed for sleep.     Marland Kitchen albuterol (PROVENTIL) (2.5 MG/3ML) 0.083% nebulizer solution Take 3 mLs by nebulization daily as needed for shortness of breath.    . fluconazole (DIFLUCAN) 200 MG tablet Take 2 tablets(400 mg) x one dose, then take 1 tablet (200 mg) daily until finished (Patient not taking: Reported on 11/04/2016) 15 tablet 0  . lidocaine (XYLOCAINE) 2 % solution Take 5 cc's by mouth 15 minutes before a meal. (Patient not taking: Reported on 11/04/2016) 100 mL 1  . ondansetron (ZOFRAN ODT) 4 MG disintegrating tablet Dissolve 1 tablet on the tongue every 6 hours as needed for nausea. (Patient not taking: Reported on 11/04/2016) 40 tablet 0  . pregabalin (LYRICA) 75 MG capsule Take 75 mg by mouth 2 (two) times daily.    . Suvorexant (BELSOMRA) 10 MG TABS Take 10 mg by mouth. Take 1 tab within 30 minutes of going to bed.    . umeclidinium-vilanterol (ANORO ELLIPTA) 62.5-25 MCG/INH AEPB  Inhale 1 puff into the lungs daily. Inhale 1 puff daily.     Current Facility-Administered Medications  Medication Dose Route Frequency Provider Last Rate Last Dose  . 0.9 %  sodium chloride infusion  500 mL Intravenous Continuous Armbruster, Renelda Loma, MD        REVIEW OF SYSTEMS:  [X]  denotes positive finding, [ ]  denotes negative finding Cardiac  Comments:  Chest pain or chest pressure: X   Shortness of breath upon exertion: X   Short of breath when lying flat: X   Irregular heart rhythm:        Vascular    Pain in calf, thigh, or hip brought on by ambulation: X   Pain in feet at night that wakes you up from your sleep:     Blood clot in your veins:    Leg swelling:         Pulmonary    Oxygen at home:    Productive cough:  X   Wheezing:         Neurologic    Sudden weakness in arms or legs:     Sudden numbness in arms or legs:     Sudden onset of difficulty speaking or slurred speech:    Temporary loss of vision in one eye:     Problems with dizziness:         Gastrointestinal    Blood in stool:     Vomited blood:         Genitourinary    Burning when urinating:  Blood in urine:        Psychiatric    Major depression:         Hematologic    Bleeding problems:    Problems with blood clotting too easily:        Skin    Rashes or ulcers:        Constitutional    Fever or chills:     PHYSICAL EXAM:   Vitals:   11/04/16 1046 11/04/16 1050  BP: (!) 187/102 (!) 198/124  Pulse: 96 91  Resp: 16   Temp: 98.5 F (36.9 C)   SpO2: 96%   Weight: 206 lb (93.4 kg)   Height: 5' 6.5" (1.689 m)   Body mass index is 32.75 kg/m.   GENERAL: The patient is a well-nourished female, in no acute distress. The vital signs are documented above. CARDIAC: There is a regular rate and rhythm.  VASCULAR: I do not detect carotid bruits. She does have palpable femoral pulses although they are somewhat difficult to palpate because of her obesity. She has palpable  popliteal, dorsalis pedis, and posterior tibial pulses bilaterally. She has mild bilateral lower extremity swelling. PULMONARY: There is good air exchange bilaterally without wheezing or rales. ABDOMEN: Soft and non-tender with normal pitched bowel sounds.  MUSCULOSKELETAL: There are no major deformities or cyanosis. NEUROLOGIC: No focal weakness or paresthesias are detected. SKIN: There are no ulcers or rashes noted. PSYCHIATRIC: The patient has a normal affect.  DATA:    RENAL DUPLEX: I have reviewed the renal artery duplex scan that was done on 09/03/2016. The right kidney measured 11 cm in length. The left kidney measured 11.5 cm in length. There were elevated velocities in the proximal renal arteries bilaterally.  LABS: Most recent labs show a GFR of 87.  MEDICAL ISSUES:   POORLY CONTROLLED BLOOD PRESSURE: Based on her renal artery duplex scan, and history, this patient may have bilateral renal artery stenoses. I have recommended we proceed with arteriography to further evaluate her for renal artery stenoses. If in fact she does have significant disease this could potentially be addressed with renal angioplasty at the same time. I have discussed the indications for the procedure and the potential complications, including, but not limited to, bleeding, arterial thrombosis, dissection, the need for surgery or loss of a kidney. I have scheduled this with Dr. Ruta Hinds who has a much larger experience with renal artery artery angioplasty than myself. Her procedure is scheduled for 11/12/2016. She is on Plavix. Her renal function is normal. Creatinine is 0.77 with a GFR of 87. We have discussed the importance of tobacco cessation. I spent more than 3 minutes on this conversation.  Deitra Mayo [facility] [place] [phone]

## 2016-12-03 NOTE — Progress Notes (Signed)
Patient did not want wear telemetry, stay in the hospital, have an IV, have nasal swab for MRSA, and generally wanting to leave "[facility]". Called Dr. Scot Dock to make aware. Spoke with patient and she is not happy about "being held prisoner".  She agrees to keep IV saline locked, and wear telemetry.  She refuses to have a nasal swab. She states she is a smoker and she will probably not be staying. I explained her options and she is at bedside currently on/off phone with daughter. Pt stating she has too many food allergies to be on a heart healthy diet. Dietary to come and speak with her. Pt resting with call bell within reach.  Will continue to monitor. Payton Emerald, RN

## 2016-12-03 NOTE — Progress Notes (Signed)
Goal was for SBP to be 180-220 tonight because lowering her BP too quickly supposedly caused a stroke in the past.  Pt's BP this PM was 158/82 manually on the right wrist.  The on call hospitalist was informed and ordered not to administer the nightly blood pressure meds, losartan and chlorthalidone, to keep her BP from dropping too low.  Pt is asymptomatic and resting comfortably in room with call bell in reach.  Will continue to monitor pt.  Lupita Dawn, RN

## 2016-12-03 NOTE — Consult Note (Addendum)
Medical Consultation   Katrina [name]  JFH:[id number]  DOB: July 12, 1960  DOA: 12/03/2016  PCP: Berkley Harvey, NP   Requesting physician: Dr Scot Dock  Reason for consultation: HTN management   History of Present Illness: Katrina [name] is an 56 y.o. female with past medical history of coronary artery disease, COPD, stroke, anxiety, uncontrolled blood pressure was admitted by Dr. Scot Dock for evaluation of uncontrolled blood pressure. Plans to have aortogram and renal artery angiogram with possible stenting on 12/06/2016. Patient seen and examined by me at bedside and does not have any complaints. She tells me in the past she has tried hydrochlorothiazide, Coreg, metoprolol, Norvasc, lisinopril, clonidine, clonidine patch, diuretics, labetalol, losartan and none of the blood pressure medicines have worked for her. Tells me her blood pressure normally stays above 220. She is also been seeing a nephrologist outpatient due to worsening renal function causing hematuria. She had a renal duplex done in March 2018 which showed increased velocity in proximal renal arteries bilaterally which raise suspicion for renal artery stenosis. Due to persistent elevation of her blood pressure she also reports of occasional headaches. Reports in the past when she was admitted at [facility] and when they tried to decrease her blood pressure too fast she ended up having a stroke?Marland Kitchen   Review of Systems:  ROS As per HPI otherwise 10 point review of systems negative.     Past Medical History: Past Medical History:  Diagnosis Date  . Allergy   . Anginal pain ([place])    - none currently  . Anxiety    takes Valium daily as needed  . Asthma    has inhalers but doesn't use  . CAD (coronary artery disease)    Moderate nonobstructive 2012-2013, [place]; No angiographic evidence of CAD 01/31/15 [facility]  . Chronic back pain    budlging disc   . COPD (chronic obstructive pulmonary disease) ([place])   . Depression     . Diverticulitis   . Family history of adverse reaction to anesthesia    daughter gets extremely sick   . GERD (gastroesophageal reflux disease)    takes Dexilant daily  . History of blood transfusion 1975/1976   no abnormal reaction noted  . History of bronchitis 06/2015  . History of colitis   . History of colon polyps    benign  . History of gastric ulcer   . History of migraine    last one a few days ago  . History of MRSA infection 2017  . Hyperlipidemia    was on meds but has been off over a yr  . Hypertension    takes Amlodipine and Maxzide daily  . Insomnia    takes Ambien nightly  . Lung nodules   . Microscopic hematuria   . MS (multiple sclerosis) ([place])    questionable per pt  . Osteoporosis   . Peripheral neuropathy    weakness,numbness,and tingling. Takes Gabapentin daily  . Pneumonia 06/2015  . Restless leg syndrome    takes Requip daily  . Stroke ([place])    x 2 --takes Plavix daily.Left sided weakness    Past Surgical History: Past Surgical History:  Procedure Laterality Date  . ABDOMINAL HYSTERECTOMY    . ANKLE SURGERY Bilateral   . APPENDECTOMY    . BACK SURGERY     fusion   . CARDIAC CATHETERIZATION N/A 01/31/2015   Procedure: Left Heart Cath and Coronary Angiography;  Surgeon: Burnell Blanks, MD;  Location: [facility];  Service: Cardiovascular;  Laterality: N/A;  . CHOLECYSTECTOMY    . COLONOSCOPY    . ESOPHAGOGASTRODUODENOSCOPY    . NOSE SURGERY     x 2  . RADIOLOGY WITH ANESTHESIA N/A 01/15/2016   Procedure: MRI LUMBAR SPINE WITHOUT;  Surgeon: Medication Radiologist, MD;  Location: [facility];  Service: Radiology;  Laterality: N/A;  . RADIOLOGY WITH ANESTHESIA N/A 06/29/2016   Procedure: MRI OF THE BRAIN WITH AND WITHOUT;  Surgeon: Medication Radiologist, MD;  Location: [facility];  Service: Radiology;  Laterality: N/A;  . TONSILLECTOMY    . 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 tumor removed from left breast       Allergies:    Allergies  Allergen Reactions  . Other Anaphylaxis and Swelling    [place]  . Penicillins Anaphylaxis    Has patient had a PCN reaction causing immediate rash, facial/tongue/throat swelling, SOB or lightheadedness with hypotension: Yes Has patient had a PCN reaction causing severe rash involving mucus membranes or skin necrosis: Yes Has patient had a PCN reaction that required hospitalization Yes Has patient had a PCN reaction occurring within the last 10 years: No If all of the above answers are "NO", then may proceed with Cephalosporin use.   . Zithromax [Azithromycin] Anaphylaxis  . Aspirin Other (See Comments)    Due to stomach ulcers.   . Pineapple Rash  . Strawberry Extract Rash and Hives  . Wheat Bran Other (See Comments)    Sneezing, rhinorrhea  . Yeast-Related Products Other (See Comments)    Sneezing, rhinorrhea  . Aspartame And Phenylalanine Palpitations  . Mushroom Extract Complex Rash  . Nicardipine Nausea And Vomiting and Other (See Comments)    shaking     Social History:  reports that she has been smoking Cigarettes.  She started smoking about 45 years ago. She has a 88.00 pack-year smoking history. She has never used smokeless tobacco. She reports that she does not drink alcohol or use drugs.   Family History: Family History  Problem Relation Age of Onset  . Coronary artery disease Father   . Emphysema Father   . Heart attack Father   . Stroke Father   . Cancer Father        Unsure of type   . Allergic rhinitis Father   . Depression Mother   . Cancer Mother        skin cancer  . Bipolar disorder Brother   . Drug abuse Brother   . Cancer Brother        leukemia  . Bipolar disorder Daughter   . Allergic rhinitis Daughter   . Asthma Daughter   . Urticaria Daughter   . Diabetes Maternal Grandmother   . Stomach cancer Maternal Grandfather   . Cancer Maternal Grandfather        stomach   . Angioedema Neg Hx   . Atopy Neg Hx   . Eczema Neg Hx   .  Immunodeficiency Neg Hx      Acceptable: Family history reviewed   Physical Exam: Vitals:   12/03/16 0550 12/03/16 0556 12/03/16 0826 12/03/16 0908  BP: (!) 221/137 (!) 207/122 (!) 216/89 (!) 191/108  Pulse:  87  73  Resp:    (!) 22  Temp:  98.7 F (37.1 C)  97.6 F (36.4 C)  TempSrc:  Oral  Oral  SpO2:  98%  100%  Weight:  95.3 kg (  210 lb)    Height:  5' 6.6" (1.692 m)      Constitutional: Appearance,  Alert and awake, oriented x3, not in any acute distress. Eyes: PERLA, EOMI, irises appear normal, anicteric sclera,  ENMT: external ears and nose appear normal, normal hearing or hard of hearing            Lips appears normal, oropharynx mucosa, tongue, posterior pharynx appear normal  Neck: neck appears normal, no masses, normal ROM, no thyromegaly, no JVD  CVS: S1-S2 clear, no murmur rubs or gallops, no LE edema, normal pedal pulses  Respiratory:  clear to auscultation bilaterally, no wheezing, rales or rhonchi. Respiratory effort normal. No accessory muscle use.  Abdomen: soft nontender, nondistended, normal bowel sounds, no hepatosplenomegaly, no hernias  Musculoskeletal: : no cyanosis, clubbing or edema noted bilaterally                       Joint/bones/muscle exam, strength, contractures or atrophy Neuro: Cranial nerves II-XII intact, strength, sensation, reflexes Psych: judgement and insight appear normal, stable mood and affect, mental status Skin: no rashes or lesions or ulcers, no induration or nodules    Data reviewed:  I have personally reviewed following labs and imaging studies Labs:  CBC:  Recent Labs Lab 11/27/16 2322 12/03/16 0610 12/03/16 1117  WBC 8.9  --  8.3  HGB 12.9 14.6 14.1  HCT 39.3 43.0 44.2  MCV 88.9  --  90.0  PLT 251  --  678    Basic Metabolic Panel:  Recent Labs Lab 11/27/16 2322 12/03/16 0610 12/03/16 1117  NA 143 141 139  K 4.1 4.5 3.6  CL 109 107 110  CO2 28  --  20*  GLUCOSE 92 103* 100*  BUN 20 19 11   CREATININE  0.85 0.80 0.79  CALCIUM 9.2  --  8.9   GFR Estimated Creatinine Clearance: 93.4 mL/min (by C-G formula based on SCr of 0.79 mg/dL). Liver Function Tests:  Recent Labs Lab 12/03/16 1117  AST 26  ALT 20  ALKPHOS 69  BILITOT 0.6  PROT 6.8  ALBUMIN 3.7   No results for input(s): LIPASE, AMYLASE in the last 168 hours. No results for input(s): AMMONIA in the last 168 hours. Coagulation profile  Recent Labs Lab 12/03/16 1117  INR 1.16    Cardiac Enzymes:  Recent Labs Lab 11/27/16 2322  TROPONINI <0.03   BNP: Invalid input(s): POCBNP CBG: No results for input(s): GLUCAP in the last 168 hours. D-Dimer No results for input(s): DDIMER in the last 72 hours. Hgb A1c No results for input(s): HGBA1C in the last 72 hours. Lipid Profile No results for input(s): CHOL, HDL, LDLCALC, TRIG, CHOLHDL, LDLDIRECT in the last 72 hours. Thyroid function studies No results for input(s): TSH, T4TOTAL, T3FREE, THYROIDAB in the last 72 hours.  Invalid input(s): FREET3 Anemia work up No results for input(s): VITAMINB12, FOLATE, FERRITIN, TIBC, IRON, RETICCTPCT in the last 72 hours. Urinalysis    Component Value Date/Time   COLORURINE YELLOW 10/14/2016 2335   APPEARANCEUR CLEAR 10/14/2016 2335   LABSPEC 1.033 (H) 10/14/2016 2335   PHURINE 6.0 10/14/2016 2335   GLUCOSEU NEGATIVE 10/14/2016 2335   HGBUR MODERATE (A) 10/14/2016 2335   BILIRUBINUR NEGATIVE 10/14/2016 2335   KETONESUR NEGATIVE 10/14/2016 2335   PROTEINUR 30 (A) 10/14/2016 2335   UROBILINOGEN 0.2 01/23/2015 2255   NITRITE NEGATIVE 10/14/2016 2335   LEUKOCYTESUR NEGATIVE 10/14/2016 2335     Microbiology No results found for this or  any previous visit (from the past 240 hour(s)).     Inpatient Medications:   Scheduled Meds: . amLODipine  10 mg Oral Daily  . chlorthalidone  25 mg Oral BID  . clopidogrel  75 mg Oral Once  . clopidogrel  75 mg Oral Daily  . enoxaparin (LOVENOX) injection  40 mg Subcutaneous Q24H    . gabapentin  600 mg Oral QID  . losartan  50 mg Oral BID  . pantoprazole  40 mg Oral Daily  . potassium chloride  20-40 mEq Oral Once  . rOPINIRole  4 mg Oral QHS  . zolpidem  5 mg Oral QHS   Continuous Infusions:   Radiological Exams on Admission: No results found.  Impression/Recommendations Active Problems:   Hypertension  Uncontrolled blood pressure/hypertension -At this time continue her current home regimen of Norvasc and Cozaar. I will also add hydralazine IV 10 mg every 4 hours as needed only to be given if her blood pressure systolic is greater than 945. Maintain her blood pressure systolic in the range of 038-882. Avoid quickly lowering her blood pressure. Additionally lower over several days to a normal range  -Plans on aortogram and bilateral renal angiogram on Monday -We'll check her TSH. Will also check for urine metanephrines, not sure if this has been done in the past -Tylenol when necessary for headaches  Coronary artery disease -Continue aspirin and Plavix  Peripheral neuropathy -Continue gabapentin  Type 2 diabetes -Accu-Cheks and sliding scale. Doesn't want carb modified diet.   Tobacco Use -can get nicotine patch if needed   Hx of CVA -already on ASA and plavix.   Hx of Anxiety - On valium   Thank you for this consultation.  Our [facility] hospitalist team will follow the patient with you.   Time Spent: 60 mins  Ankit Arsenio Loader M.D. [facility] 12/03/2016, 3:27 PM

## 2016-12-04 DIAGNOSIS — I151 Hypertension secondary to other renal disorders: Secondary | ICD-10-CM | POA: Diagnosis not present

## 2016-12-04 DIAGNOSIS — I10 Essential (primary) hypertension: Secondary | ICD-10-CM | POA: Diagnosis not present

## 2016-12-04 DIAGNOSIS — N2889 Other specified disorders of kidney and ureter: Secondary | ICD-10-CM

## 2016-12-04 LAB — GLUCOSE, CAPILLARY
GLUCOSE-CAPILLARY: 113 mg/dL — AB (ref 65–99)
Glucose-Capillary: 145 mg/dL — ABNORMAL HIGH (ref 65–99)
Glucose-Capillary: 120 mg/dL — ABNORMAL HIGH (ref 65–99)
Glucose-Capillary: 114 mg/dL — ABNORMAL HIGH (ref 65–99)

## 2016-12-04 LAB — TSH: TSH: 1.678 u[IU]/mL (ref 0.350–4.500)

## 2016-12-04 LAB — HIV ANTIBODY (ROUTINE TESTING W REFLEX): HIV SCREEN 4TH GENERATION: NONREACTIVE

## 2016-12-04 MED ORDER — LOSARTAN POTASSIUM 50 MG PO TABS
75.0000 mg | ORAL_TABLET | Freq: Two times a day (BID) | ORAL | Status: DC
  Administered 2016-12-04 – 2016-12-05 (×3): 75 mg via ORAL
  Filled 2016-12-04 (×4): qty 1

## 2016-12-04 MED ORDER — HYDRALAZINE HCL 20 MG/ML IJ SOLN: 10.0000 mg | Freq: Three times a day (TID) | INTRAMUSCULAR | Status: DC | PRN

## 2016-12-04 NOTE — Progress Notes (Signed)
   VASCULAR SURGERY ASSESSMENT & PLAN:   POORLY CONTROLLED HYPERTENSION WITH BILATERAL RENAL ARTERY STENOSES: The patient is scheduled for renal arteriography, possible  angioplasty and stenting of bilateral renal arteries under general anesthetic on Monday by Dr. Oneida Alar. Appreciate Dr. Latina Craver help with managing blood pressure during this admission.  SUBJECTIVE:   No complaints.  PHYSICAL EXAM:   Vitals:   12/03/16 2029 12/04/16 0507 12/04/16 0955 12/04/16 1417  BP: (!) 158/82 (!) 180/72 (!) 213/108 (!) 236/104  Pulse: 87 86  (!) 7  Resp:  17  17  Temp:  98.4 F (36.9 C)  98.4 F (36.9 C)  TempSrc:  Oral  Oral  SpO2:  97%  100%  Weight:      Height:       Lungs clear.  LABS:   Lab Results  Component Value Date   WBC 8.3 12/03/2016   HGB 14.1 12/03/2016   HCT 44.2 12/03/2016   MCV 90.0 12/03/2016   PLT 232 12/03/2016   Lab Results  Component Value Date   CREATININE 0.79 12/03/2016   Lab Results  Component Value Date   INR 1.16 12/03/2016   CBG (last 3)   Recent Labs  12/04/16 0633 12/04/16 1139 12/04/16 1629  GLUCAP 113* 145* 120*    PROBLEM LIST:    Active Problems:   Hypertension   CURRENT MEDS:   . amLODipine  10 mg Oral Daily  . Chlorhexidine Gluconate Cloth  6 each Topical Q0600  . chlorthalidone  25 mg Oral BID  . clopidogrel  75 mg Oral Once  . clopidogrel  75 mg Oral Daily  . gabapentin  600 mg Oral QID  . insulin aspart  0-5 Units Subcutaneous QHS  . insulin aspart  0-9 Units Subcutaneous TID WC  . losartan  75 mg Oral BID  . mupirocin ointment  1 application Nasal BID  . nicotine  21 mg Transdermal Daily  . pantoprazole  40 mg Oral Daily  . potassium chloride  20-40 mEq Oral Once  . rOPINIRole  4 mg Oral QHS  . zolpidem  5 mg Oral QHS    Gae Gallop Beeper: [phone] Office: [phone] 12/04/2016

## 2016-12-04 NOTE — Progress Notes (Signed)
[facility] PROGRESS NOTE  Katrina [name] JOI:[id number] DOB: 08-27-1960 DOA: 12/03/2016 PCP: Berkley Harvey, NP  Interim summary and HPI 56 y/o with pmh significant for HTN, tobacco abuse, diabetes, COPD and prior stroke; admitted by vascular surgery for further evaluation and treatment of suspected renal artery stenosis. [facility] has been consulted to assist with BP management and to look after her other medical problems.  Assessment/Plan: 1-uncontrolled HTN -patient reluctant to changes in her medication and follow whatever diet she wants. -denies CP or SOB currently -will recommend increased in her cozaar to 75mg  BID, continue norvasc and chlorthalidone. -encourage to follow low sodium diet and to quit smoking  -vascular surgery looking to perform aortogram and renal angiogram on Monday; with possible stenting process of renal artery -will continue PRN hydralazine  2-type 2 diabetes: following diet control only; not on medications -will check A1C -modified carb diet ordered along with heart healthy diet  -not on insulin therapy or providing coverage, unless CBG's > 200  3-tobacco abuse -cessation counseling provided -patient reported that she is not interested in quitting.  -continue nicoderm while inpatient   4-anxiety -continue PRN valium  5-GERD -will continue PPI  6-hypokalemia: -will follow electrolytes and replete it further as needed   7-insomnia  -continue PRN zolpidem     Code Status: DNR Family Communication: no family at bedside  Disposition Plan: will continue adjusting medications for better BP control and will follow rest of her medical problems. Plan is for aortogram and renal angiogram on Monday 7/2   Procedures:  See below for x-ray reports   Antibiotics:  None   HPI/Subjective: Afebrile, no CP, no SOB. Patient concern about BP fluctuating behavior and concerned about lowering BP too much/too fast. No nausea, no vomiting, no abd  pain.  Objective: Vitals:   12/03/16 2029 12/04/16 0507  BP: (!) 158/82 (!) 180/72  Pulse: 87 86  Resp:  17  Temp:  98.4 F (36.9 C)   No intake or output data in the 24 hours ending 12/04/16 0922 Filed Weights   12/03/16 0556  Weight: 95.3 kg (210 lb)    Exam:   General:  Afebrile, no CP, no SOB, no abd pain. Patient reported that food is bad and w/o flavor and is essentially refusing to eat. No HA's and AAOX3.  Cardiovascular: S1 and S2, no rubs or gallops   Respiratory: good air movement bilaterally, no wheezing or crackles  Abdomen: soft, NT, ND, positive BS  Musculoskeletal: no edema or cyanosis   Data Reviewed: Basic Metabolic Panel:  Recent Labs Lab 11/27/16 2322 12/03/16 0610 12/03/16 1117  NA 143 141 139  K 4.1 4.5 3.6  CL 109 107 110  CO2 28  --  20*  GLUCOSE 92 103* 100*  BUN 20 19 11   CREATININE 0.85 0.80 0.79  CALCIUM 9.2  --  8.9   Liver Function Tests:  Recent Labs Lab 12/03/16 1117  AST 26  ALT 20  ALKPHOS 69  BILITOT 0.6  PROT 6.8  ALBUMIN 3.7   CBC:  Recent Labs Lab 11/27/16 2322 12/03/16 0610 12/03/16 1117  WBC 8.9  --  8.3  HGB 12.9 14.6 14.1  HCT 39.3 43.0 44.2  MCV 88.9  --  90.0  PLT 251  --  232   Cardiac Enzymes:  Recent Labs Lab 11/27/16 2322  TROPONINI <0.03   BNP (last 3 results)  Recent Labs  04/02/16 2301 11/27/16 2322  BNP 24.0 32.0   CBG:  Recent Labs  Lab 12/03/16 1643 12/03/16 2049 12/04/16 0633  GLUCAP 132* 163* 113*    Recent Results (from the past 240 hour(s))  Surgical pcr screen     Status: Abnormal   Collection Time: 12/03/16  4:23 PM  Result Value Ref Range Status   MRSA, PCR POSITIVE (A) NEGATIVE Final    Comment: RESULT CALLED TO, READ BACK BY AND VERIFIED WITH: Kathaleen Grinder RN 1821 12/03/16 A BROWNING    Staphylococcus aureus POSITIVE (A) NEGATIVE Final    Comment:        The Xpert SA Assay (FDA approved for NASAL specimens in patients over 25 years of age), is one  component of a comprehensive surveillance program.  Test performance has been validated by [facility] for patients greater than or equal to 3 year old. It is not intended to diagnose infection nor to guide or monitor treatment.      Studies: No results found.  Scheduled Meds: . amLODipine  10 mg Oral Daily  . Chlorhexidine Gluconate Cloth  6 each Topical Q0600  . chlorthalidone  25 mg Oral BID  . clopidogrel  75 mg Oral Once  . clopidogrel  75 mg Oral Daily  . enoxaparin (LOVENOX) injection  40 mg Subcutaneous Q24H  . gabapentin  600 mg Oral QID  . insulin aspart  0-5 Units Subcutaneous QHS  . insulin aspart  0-9 Units Subcutaneous TID WC  . losartan  75 mg Oral BID  . mupirocin ointment  1 application Nasal BID  . nicotine  21 mg Transdermal Daily  . pantoprazole  40 mg Oral Daily  . potassium chloride  20-40 mEq Oral Once  . rOPINIRole  4 mg Oral QHS  . zolpidem  5 mg Oral QHS    Time spent: 25 minutes   Barton Dubois  [facility] Pager [phone]. If 7PM-7AM, please contact night-coverage at [email or web address], password [facility] 12/04/2016, 9:22 AM  LOS: 1 day

## 2016-12-05 DIAGNOSIS — I10 Essential (primary) hypertension: Secondary | ICD-10-CM | POA: Diagnosis not present

## 2016-12-05 DIAGNOSIS — I151 Hypertension secondary to other renal disorders: Secondary | ICD-10-CM | POA: Diagnosis not present

## 2016-12-05 DIAGNOSIS — N2889 Other specified disorders of kidney and ureter: Secondary | ICD-10-CM | POA: Diagnosis not present

## 2016-12-05 LAB — RENAL FUNCTION PANEL
ANION GAP: 6 (ref 5–15)
Albumin: 3 g/dL — ABNORMAL LOW (ref 3.5–5.0)
BUN: 15 mg/dL (ref 6–20)
CALCIUM: 8.6 mg/dL — AB (ref 8.9–10.3)
CHLORIDE: 110 mmol/L (ref 101–111)
CO2: 24 mmol/L (ref 22–32)
Creatinine, Ser: 1.02 mg/dL — ABNORMAL HIGH (ref 0.44–1.00)
Glucose, Bld: 118 mg/dL — ABNORMAL HIGH (ref 65–99)
Phosphorus: 4 mg/dL (ref 2.5–4.6)
Potassium: 3.8 mmol/L (ref 3.5–5.1)
SODIUM: 140 mmol/L (ref 135–145)

## 2016-12-05 LAB — GLUCOSE, CAPILLARY
GLUCOSE-CAPILLARY: 124 mg/dL — AB (ref 65–99)
GLUCOSE-CAPILLARY: 135 mg/dL — AB (ref 65–99)
GLUCOSE-CAPILLARY: 108 mg/dL — AB (ref 65–99)
Glucose-Capillary: 119 mg/dL — ABNORMAL HIGH (ref 65–99)

## 2016-12-05 LAB — HEMOGLOBIN A1C
Hgb A1c MFr Bld: 5.8 % — ABNORMAL HIGH (ref 4.8–5.6)
Mean Plasma Glucose: 120 mg/dL

## 2016-12-05 MED ORDER — VANCOMYCIN HCL IN DEXTROSE 1-5 GM/200ML-% IV SOLN
1000.0000 mg | INTRAVENOUS | Status: DC
  Filled 2016-12-05: qty 200

## 2016-12-05 NOTE — Progress Notes (Addendum)
Verbal order received from Leontine Locket, PA that patient may shower, go outside the hospital and have a regular diet at the request of the patient, will continue to monitor and provide patient education.

## 2016-12-05 NOTE — Progress Notes (Addendum)
  Progress Note    12/05/2016 9:49 AM Hospital Day 2  Subjective:  upset this morning and states that she has anxiety about being here and the procedure and wants to go home and upset that there is food allergies listed on her chart and she received those on her food tray.    Afebrile HR 70's-80's NSR 845'X-646'O systolic (032'Z yesterday afternoon) 97% RA  Vitals:   12/04/16 2118 12/05/16 0620  BP: (!) 193/104 (!) 188/93  Pulse: 78 82  Resp: 18 18  Temp: 98.6 F (37 C)     Physical Exam: General:  No acute distress  CBC    Component Value Date/Time   WBC 8.3 12/03/2016 1117   RBC 4.91 12/03/2016 1117   HGB 14.1 12/03/2016 1117   HCT 44.2 12/03/2016 1117   PLT 232 12/03/2016 1117   MCV 90.0 12/03/2016 1117   MCH 28.7 12/03/2016 1117   MCHC 31.9 12/03/2016 1117   RDW 15.3 12/03/2016 1117   LYMPHSABS 3.5 10/14/2016 2133   MONOABS 0.9 10/14/2016 2133   EOSABS 0.2 10/14/2016 2133   BASOSABS 0.0 10/14/2016 2133    BMET    Component Value Date/Time   NA 140 12/05/2016 0239   K 3.8 12/05/2016 0239   CL 110 12/05/2016 0239   CO2 24 12/05/2016 0239   GLUCOSE 118 (H) 12/05/2016 0239   BUN 15 12/05/2016 0239   CREATININE 1.02 (H) 12/05/2016 0239   CALCIUM 8.6 (L) 12/05/2016 0239   GFRNONAA >60 12/05/2016 0239   GFRAA >60 12/05/2016 0239    INR    Component Value Date/Time   INR 1.16 12/03/2016 1117     Intake/Output Summary (Last 24 hours) at 12/05/16 0949 Last data filed at 12/04/16 1822  Gross per 24 hour  Intake                0 ml  Output              400 ml  Net             -400 ml     Assessment/Plan:  56 y.o. female with bilateral renal artery stenosis Hospital Day 2  -pt upset this morning and states that she has anxiety about being here and the procedure and wants to go home. -upset that there is food allergies listed on her chart and she received those on her food tray.  -pt is npo after MN and consent ordered -Dr. Scot Dock will be in to  speak with pt later today.   Leontine Locket, PA-C [facility] [phone] 12/05/2016 9:49 AM  I have interviewed the patient and examined the patient. I agree with the findings by the PA. Blood pressure still poorly controlled. She is scheduled for arteriogram and possible bilateral renal artery angioplasty and stents. This will be done under general anesthetic. Her preop orders have been written.  Gae Gallop, MD [phone]

## 2016-12-05 NOTE — Progress Notes (Signed)
[facility] PROGRESS NOTE  Katrina [name] IWL:[id number] DOB: 04/24/1961 DOA: 12/03/2016 PCP: Berkley Harvey, NP  Interim summary and HPI 56 y/o with pmh significant for HTN, tobacco abuse, diabetes, COPD and prior stroke; admitted by vascular surgery for further evaluation and treatment of suspected renal artery stenosis. [facility] has been consulted to assist with BP management and to look after her other medical problems.  Assessment/Plan: 1-uncontrolled HTN -patient reluctant to changes in her medication and following/asking for whatever diet she wants. -denies CP or SOB currently -recommending to continue cozaar to 75mg  BID, continue norvasc and chlorthalidone. -encourage to follow low sodium diet and to quit smoking  -Primary service (vascular surgery) looking to perform aortogram and renal angiogram on Monday; with possible stenting process of renal artery. -Continue PRN hydralazine  2-type 2 diabetes: following diet control only; not on medications -A1C 5.8 -will recommend modified carb diet  -not on insulin therapy or hypoglycemic regimen at home -check CBG's and not coverage needed unless CBG > 200  3-tobacco abuse -cessation counseling provided -patient reported that she is not interested in quitting.  -continue nicoderm while inpatient   4-anxiety -continue PRN valium  5-GERD -will continue PPI  6-hypokalemia: -follow electrolytes and replete as needed   7-insomnia  -continue PRN zolpidem     Code Status: DNR Family Communication: no family at bedside  Disposition Plan: will recommend continue adjusting medications for better BP control and pursuit aortogram and renal angiogram on Monday 7/2. For now will sign off and will remain available if needed. Patient is declining treatment and refusing meds.   Procedures:  See below for x-ray reports   Antibiotics:  None   HPI/Subjective: Afebrile, no CP, no SOB, no nausea, no vomiting. Patient declining some  meds and asking for regular diet.  Objective: Vitals:   12/05/16 0620 12/05/16 1014  BP: (!) 188/93 (!) 178/106  Pulse: 82 76  Resp: 18 18  Temp:  98.3 F (36.8 C)    Intake/Output Summary (Last 24 hours) at 12/05/16 1354 Last data filed at 12/04/16 1822  Gross per 24 hour  Intake                0 ml  Output              400 ml  Net             -400 ml   Filed Weights   12/03/16 0556  Weight: 95.3 kg (210 lb)    Exam:   General:  No fever, no nausea, no vomiting, no CP and no SOB. Patient asking for regular food and wants to have a shower. Also declining taking her ordered medications at time because she said her BP is supposed to run high.   Cardiovascular: S1 and S2, no rubs, no gallops   Respiratory: good air movement bilaterally, no wheezing, no crackles   Abdomen: soft, NT, ND, positive BS  Musculoskeletal: no swelling, no cyanosis, no clubbing    Data Reviewed: Basic Metabolic Panel:  Recent Labs Lab 12/03/16 0610 12/03/16 1117 12/05/16 0239  NA 141 139 140  K 4.5 3.6 3.8  CL 107 110 110  CO2  --  20* 24  GLUCOSE 103* 100* 118*  BUN 19 11 15   CREATININE 0.80 0.79 1.02*  CALCIUM  --  8.9 8.6*  PHOS  --   --  4.0   Liver Function Tests:  Recent Labs Lab 12/03/16 1117 12/05/16 0239  AST 26  --  ALT 20  --   ALKPHOS 69  --   BILITOT 0.6  --   PROT 6.8  --   ALBUMIN 3.7 3.0*   CBC:  Recent Labs Lab 12/03/16 0610 12/03/16 1117  WBC  --  8.3  HGB 14.6 14.1  HCT 43.0 44.2  MCV  --  90.0  PLT  --  232   BNP (last 3 results)  Recent Labs  04/02/16 2301 11/27/16 2322  BNP 24.0 32.0   CBG:  Recent Labs Lab 12/04/16 1139 12/04/16 1629 12/04/16 2114 12/05/16 0618 12/05/16 1155  GLUCAP 145* 120* 114* 124* 135*    Recent Results (from the past 240 hour(s))  Surgical pcr screen     Status: Abnormal   Collection Time: 12/03/16  4:23 PM  Result Value Ref Range Status   MRSA, PCR POSITIVE (A) NEGATIVE Final    Comment: RESULT  CALLED TO, READ BACK BY AND VERIFIED WITH: Kathaleen Grinder RN 1821 12/03/16 A BROWNING    Staphylococcus aureus POSITIVE (A) NEGATIVE Final    Comment:        The Xpert SA Assay (FDA approved for NASAL specimens in patients over 5 years of age), is one component of a comprehensive surveillance program.  Test performance has been validated by [facility] for patients greater than or equal to 37 year old. It is not intended to diagnose infection nor to guide or monitor treatment.      Studies: No results found.  Scheduled Meds: . amLODipine  10 mg Oral Daily  . Chlorhexidine Gluconate Cloth  6 each Topical Q0600  . chlorthalidone  25 mg Oral BID  . clopidogrel  75 mg Oral Once  . clopidogrel  75 mg Oral Daily  . gabapentin  600 mg Oral QID  . insulin aspart  0-5 Units Subcutaneous QHS  . insulin aspart  0-9 Units Subcutaneous TID WC  . losartan  75 mg Oral BID  . mupirocin ointment  1 application Nasal BID  . nicotine  21 mg Transdermal Daily  . pantoprazole  40 mg Oral Daily  . potassium chloride  20-40 mEq Oral Once  . rOPINIRole  4 mg Oral QHS  . zolpidem  5 mg Oral QHS    Time spent: 25 minutes   Barton Dubois  [facility] Pager [phone]. If 7PM-7AM, please contact night-coverage at [email or web address], password [email or web address] 12/05/2016, 1:54 PM  LOS: 2 days

## 2016-12-05 NOTE — Progress Notes (Signed)
Patient refused all her scheduled BP meds this morning saying that  BP 178/106 is normal for her. No sign of disress noted,she wants to be on a regular diet, patient education completed will notify the doctor and continue to monitor.

## 2016-12-06 ENCOUNTER — Encounter (HOSPITAL_COMMUNITY): Payer: Self-pay | Admitting: Vascular Surgery

## 2016-12-06 ENCOUNTER — Inpatient Hospital Stay (HOSPITAL_COMMUNITY): Payer: Medicare Other | Admitting: Certified Registered Nurse Anesthetist

## 2016-12-06 ENCOUNTER — Encounter (HOSPITAL_COMMUNITY)
Admission: AD | Disposition: A | Discharge status: Home / Self Care | Payer: Self-pay | Source: Ambulatory Visit | Attending: Vascular Surgery

## 2016-12-06 DIAGNOSIS — E1142 Type 2 diabetes mellitus with diabetic polyneuropathy: Secondary | ICD-10-CM | POA: Diagnosis not present

## 2016-12-06 DIAGNOSIS — E876 Hypokalemia: Secondary | ICD-10-CM | POA: Diagnosis not present

## 2016-12-06 DIAGNOSIS — I69351 Hemiplegia and hemiparesis following cerebral infarction affecting right dominant side: Secondary | ICD-10-CM | POA: Diagnosis not present

## 2016-12-06 DIAGNOSIS — I10 Essential (primary) hypertension: Secondary | ICD-10-CM | POA: Diagnosis not present

## 2016-12-06 LAB — CBC
HEMATOCRIT: 39 % (ref 36.0–46.0)
HEMOGLOBIN: 12.5 g/dL (ref 12.0–15.0)
MCH: 28.8 pg (ref 26.0–34.0)
MCHC: 32.1 g/dL (ref 30.0–36.0)
MCV: 89.9 fL (ref 78.0–100.0)
Platelets: 225 10*3/uL (ref 150–400)
RBC: 4.34 MIL/uL (ref 3.87–5.11)
RDW: 15.1 % (ref 11.5–15.5)
WBC: 7.7 10*3/uL (ref 4.0–10.5)

## 2016-12-06 LAB — GLUCOSE, CAPILLARY
GLUCOSE-CAPILLARY: 82 mg/dL (ref 65–99)
GLUCOSE-CAPILLARY: 132 mg/dL — AB (ref 65–99)
Glucose-Capillary: 103 mg/dL — ABNORMAL HIGH (ref 65–99)
Glucose-Capillary: 110 mg/dL — ABNORMAL HIGH (ref 65–99)
Glucose-Capillary: 139 mg/dL — ABNORMAL HIGH (ref 65–99)

## 2016-12-06 LAB — BASIC METABOLIC PANEL
Anion gap: 5 (ref 5–15)
BUN: 14 mg/dL (ref 6–20)
CHLORIDE: 110 mmol/L (ref 101–111)
CO2: 25 mmol/L (ref 22–32)
CREATININE: 0.83 mg/dL (ref 0.44–1.00)
Calcium: 8.3 mg/dL — ABNORMAL LOW (ref 8.9–10.3)
GFR calc Af Amer: >60 mL/min (ref >60)
GFR calc non Af Amer: >60 mL/min (ref >60)
Glucose, Bld: 106 mg/dL — ABNORMAL HIGH (ref 65–99)
Potassium: 3.7 mmol/L (ref 3.5–5.1)
Sodium: 140 mmol/L (ref 135–145)

## 2016-12-06 LAB — MISC LABCORP TEST (SEND OUT): Labcorp test code: 9985

## 2016-12-06 LAB — PROTIME-INR
INR: 1.02
Prothrombin Time: 13.4 seconds (ref 11.4–15.2)

## 2016-12-06 SURGERY — ABDOMINAL ANGIOGRAM
Anesthesia: General | Site: Groin | Laterality: Right

## 2016-12-06 MED ORDER — MORPHINE SULFATE (PF) 2 MG/ML IV SOLN
2.0000 mg | INTRAVENOUS | Status: DC | PRN
  Administered 2016-12-06: 2 mg via INTRAVENOUS
  Filled 2016-12-06: qty 1

## 2016-12-06 MED ORDER — PHENYLEPHRINE HCL 10 MG/ML IJ SOLN
INTRAMUSCULAR | Status: DC | PRN
  Administered 2016-12-06: 80 ug via INTRAVENOUS
  Administered 2016-12-06: 40 ug via INTRAVENOUS
  Administered 2016-12-06: 80 ug via INTRAVENOUS
  Administered 2016-12-06: 40 ug via INTRAVENOUS
  Administered 2016-12-06 (×2): 80 ug via INTRAVENOUS

## 2016-12-06 MED ORDER — DIAZEPAM 5 MG PO TABS
ORAL_TABLET | ORAL | Status: AC
  Filled 2016-12-06: qty 1

## 2016-12-06 MED ORDER — PROPOFOL 10 MG/ML IV BOLUS
INTRAVENOUS | Status: DC | PRN
  Administered 2016-12-06: 150 mg via INTRAVENOUS

## 2016-12-06 MED ORDER — EPHEDRINE SULFATE 50 MG/ML IJ SOLN
INTRAMUSCULAR | Status: DC | PRN
  Administered 2016-12-06: 5 mg via INTRAVENOUS

## 2016-12-06 MED ORDER — ACETAMINOPHEN 325 MG PO TABS
325.0000 mg | ORAL_TABLET | ORAL | Status: DC | PRN
Start: 2016-12-06 — End: 2016-12-07

## 2016-12-06 MED ORDER — IODIXANOL 320 MG/ML IV SOLN
INTRAVENOUS | Status: DC | PRN
  Administered 2016-12-06: 90 mL via INTRAVENOUS

## 2016-12-06 MED ORDER — SODIUM CHLORIDE 0.9 % IV SOLN
INTRAVENOUS | Status: DC | PRN
  Administered 2016-12-06: 500 mL

## 2016-12-06 MED ORDER — SODIUM CHLORIDE 0.9 % IV SOLN: 500.0000 mL | Freq: Once | INTRAVENOUS | Status: DC | PRN

## 2016-12-06 MED ORDER — PROMETHAZINE HCL 25 MG/ML IJ SOLN
INTRAMUSCULAR | Status: AC
  Administered 2016-12-06: 12.5 mg via INTRAVENOUS
  Filled 2016-12-06: qty 1

## 2016-12-06 MED ORDER — MAGNESIUM SULFATE 2 GM/50ML IV SOLN
2.0000 g | Freq: Every day | INTRAVENOUS | Status: DC | PRN
  Filled 2016-12-06: qty 50

## 2016-12-06 MED ORDER — FENTANYL CITRATE (PF) 100 MCG/2ML IJ SOLN
INTRAMUSCULAR | Status: AC
  Administered 2016-12-06: 25 ug via INTRAVENOUS
  Filled 2016-12-06: qty 2

## 2016-12-06 MED ORDER — MORPHINE SULFATE (PF) 4 MG/ML IV SOLN
INTRAVENOUS | Status: AC
  Administered 2016-12-06: 4 mg
  Filled 2016-12-06: qty 1

## 2016-12-06 MED ORDER — ACETAMINOPHEN 325 MG RE SUPP: 325.0000 mg | RECTAL | Status: DC | PRN

## 2016-12-06 MED ORDER — OXYCODONE HCL 5 MG PO TABS
ORAL_TABLET | ORAL | Status: AC
  Filled 2016-12-06: qty 1

## 2016-12-06 MED ORDER — LABETALOL HCL 5 MG/ML IV SOLN: 10.0000 mg | INTRAVENOUS | Status: DC | PRN

## 2016-12-06 MED ORDER — SUGAMMADEX SODIUM 200 MG/2ML IV SOLN
INTRAVENOUS | Status: DC | PRN
  Administered 2016-12-06: 200 mg via INTRAVENOUS

## 2016-12-06 MED ORDER — LACTATED RINGERS IV SOLN
INTRAVENOUS | Status: DC | PRN
  Administered 2016-12-06: 07:00:00 via INTRAVENOUS

## 2016-12-06 MED ORDER — ONDANSETRON HCL 4 MG/2ML IJ SOLN
4.0000 mg | Freq: Four times a day (QID) | INTRAMUSCULAR | Status: DC | PRN
Start: 2016-12-06 — End: 2016-12-07

## 2016-12-06 MED ORDER — METOPROLOL TARTRATE 5 MG/5ML IV SOLN: 2.0000 mg | INTRAVENOUS | Status: DC | PRN

## 2016-12-06 MED ORDER — SODIUM CHLORIDE 0.45 % IV SOLN
INTRAVENOUS | Status: DC
  Administered 2016-12-06: 13:00:00 via INTRAVENOUS

## 2016-12-06 MED ORDER — PHENOL 1.4 % MT LIQD: 1.0000 | OROMUCOSAL | Status: DC | PRN

## 2016-12-06 MED ORDER — MIDAZOLAM HCL 2 MG/2ML IJ SOLN
INTRAMUSCULAR | Status: AC
  Filled 2016-12-06: qty 2

## 2016-12-06 MED ORDER — NEOSTIGMINE METHYLSULFATE 10 MG/10ML IV SOLN: INTRAVENOUS | Status: DC | PRN

## 2016-12-06 MED ORDER — OXYCODONE HCL 5 MG PO TABS
5.0000 mg | ORAL_TABLET | Freq: Once | ORAL | Status: AC
  Administered 2016-12-06: 5 mg via ORAL

## 2016-12-06 MED ORDER — DOCUSATE SODIUM 100 MG PO CAPS: 100.0000 mg | ORAL_CAPSULE | Freq: Every day | ORAL | Status: DC

## 2016-12-06 MED ORDER — FENTANYL CITRATE (PF) 250 MCG/5ML IJ SOLN
INTRAMUSCULAR | Status: AC
  Filled 2016-12-06: qty 5

## 2016-12-06 MED ORDER — PANTOPRAZOLE SODIUM 40 MG PO TBEC: 40.0000 mg | DELAYED_RELEASE_TABLET | Freq: Every day | ORAL | Status: DC

## 2016-12-06 MED ORDER — FENTANYL CITRATE (PF) 100 MCG/2ML IJ SOLN
25.0000 ug | INTRAMUSCULAR | Status: DC | PRN
  Administered 2016-12-06: 25 ug via INTRAVENOUS
  Administered 2016-12-06: 50 ug via INTRAVENOUS
  Administered 2016-12-06: 25 ug via INTRAVENOUS

## 2016-12-06 MED ORDER — OXYCODONE-ACETAMINOPHEN 5-325 MG PO TABS
ORAL_TABLET | ORAL | Status: AC
  Administered 2016-12-06: 1
  Filled 2016-12-06: qty 1

## 2016-12-06 MED ORDER — LIDOCAINE HCL (CARDIAC) 20 MG/ML IV SOLN
INTRAVENOUS | Status: DC | PRN
  Administered 2016-12-06: 60 mg via INTRAVENOUS

## 2016-12-06 MED ORDER — 0.9 % SODIUM CHLORIDE (POUR BTL) OPTIME
TOPICAL | Status: DC | PRN
  Administered 2016-12-06: 1000 mL

## 2016-12-06 MED ORDER — PROMETHAZINE HCL 25 MG/ML IJ SOLN
6.2500 mg | INTRAMUSCULAR | Status: DC | PRN
  Administered 2016-12-06: 12.5 mg via INTRAVENOUS

## 2016-12-06 MED ORDER — HYDRALAZINE HCL 20 MG/ML IJ SOLN: 5.0000 mg | INTRAMUSCULAR | Status: DC | PRN

## 2016-12-06 MED ORDER — PROPOFOL 10 MG/ML IV BOLUS
INTRAVENOUS | Status: AC
  Filled 2016-12-06: qty 20

## 2016-12-06 MED ORDER — ONDANSETRON HCL 4 MG/2ML IJ SOLN
INTRAMUSCULAR | Status: DC | PRN
  Administered 2016-12-06: 4 mg via INTRAVENOUS

## 2016-12-06 MED ORDER — FENTANYL CITRATE (PF) 100 MCG/2ML IJ SOLN
INTRAMUSCULAR | Status: DC | PRN
  Administered 2016-12-06 (×2): 50 ug via INTRAVENOUS

## 2016-12-06 MED ORDER — POTASSIUM CHLORIDE CRYS ER 20 MEQ PO TBCR: 20.0000 meq | EXTENDED_RELEASE_TABLET | Freq: Every day | ORAL | Status: DC | PRN

## 2016-12-06 SURGICAL SUPPLY — 71 items
BAG BANDED W/RUBBER/TAPE 36X54 (MISCELLANEOUS) ×3 IMPLANT
BAG SNAP BAND KOVER 36X36 (MISCELLANEOUS) ×3 IMPLANT
BANDAGE ACE 4X5 VEL STRL LF (GAUZE/BANDAGES/DRESSINGS) IMPLANT
BANDAGE ESMARK 6X9 LF (GAUZE/BANDAGES/DRESSINGS) IMPLANT
BLADE SURG 11 STRL SS (BLADE) ×3 IMPLANT
BNDG ESMARK 6X9 LF (GAUZE/BANDAGES/DRESSINGS)
CANISTER SUCT 3000ML PPV (MISCELLANEOUS) ×3 IMPLANT
CATH OMNI FLUSH .035X70CM (CATHETERS) ×3 IMPLANT
CLIP TI MEDIUM 24 (CLIP) IMPLANT
CLIP TI WIDE RED SMALL 24 (CLIP) IMPLANT
COVER DOME SNAP 22 D (MISCELLANEOUS) ×3 IMPLANT
COVER PROBE W GEL 5X96 (DRAPES) ×3 IMPLANT
COVER SURGICAL LIGHT HANDLE (MISCELLANEOUS) ×3 IMPLANT
CUFF TOURNIQUET SINGLE 24IN (TOURNIQUET CUFF) IMPLANT
CUFF TOURNIQUET SINGLE 34IN LL (TOURNIQUET CUFF) IMPLANT
CUFF TOURNIQUET SINGLE 44IN (TOURNIQUET CUFF) IMPLANT
DRAIN CHANNEL 15F RND FF W/TCR (WOUND CARE) IMPLANT
DRAPE C-ARM 42X72 X-RAY (DRAPES) IMPLANT
DRAPE HALF SHEET 40X57 (DRAPES) IMPLANT
DRSG TEGADERM 4X4.75 (GAUZE/BANDAGES/DRESSINGS) ×3 IMPLANT
ELECT REM PT RETURN 9FT ADLT (ELECTROSURGICAL)
ELECTRODE REM PT RTRN 9FT ADLT (ELECTROSURGICAL) IMPLANT
EVACUATOR SILICONE 100CC (DRAIN) IMPLANT
GAUZE SPONGE 2X2 8PLY STRL LF (GAUZE/BANDAGES/DRESSINGS) ×2 IMPLANT
GLOVE BIO SURGEON STRL SZ7.5 (GLOVE) ×3 IMPLANT
GOWN STRL REUS W/ TWL LRG LVL3 (GOWN DISPOSABLE) ×6 IMPLANT
GOWN STRL REUS W/TWL LRG LVL3 (GOWN DISPOSABLE) ×3
KIT BASIN OR (CUSTOM PROCEDURE TRAY) ×3 IMPLANT
KIT ROOM TURNOVER OR (KITS) ×3 IMPLANT
MARKER GRAFT CORONARY BYPASS (MISCELLANEOUS) IMPLANT
NS IRRIG 1000ML POUR BTL (IV SOLUTION) ×3 IMPLANT
PACK GENERAL/GYN (CUSTOM PROCEDURE TRAY) IMPLANT
PACK PERIPHERAL VASCULAR (CUSTOM PROCEDURE TRAY) IMPLANT
PACK UNIVERSAL I (CUSTOM PROCEDURE TRAY) IMPLANT
PAD ARMBOARD 7.5X6 YLW CONV (MISCELLANEOUS) ×6 IMPLANT
PROTECTION STATION PRESSURIZED (MISCELLANEOUS) ×3
SET MICROPUNCTURE 5F STIFF (MISCELLANEOUS) IMPLANT
SHEATH AVANTI 11CM 5FR (MISCELLANEOUS) IMPLANT
SHEATH PINNACLE 6F 10CM (SHEATH) ×3 IMPLANT
SHEATH PINNACLE 8F 10CM (SHEATH) ×3 IMPLANT
SPONGE GAUZE 2X2 STER 10/PKG (GAUZE/BANDAGES/DRESSINGS) ×1
SPONGE LAP 18X18 X RAY DECT (DISPOSABLE) ×3 IMPLANT
SPONGE SURGIFOAM ABS GEL 100 (HEMOSTASIS) IMPLANT
STAPLER VISISTAT 35W (STAPLE) IMPLANT
STATION PROTECTION PRESSURIZED (MISCELLANEOUS) ×2 IMPLANT
STOPCOCK 4 WAY LG BORE MALE ST (IV SETS) IMPLANT
STOPCOCK MORSE 400PSI 3WAY (MISCELLANEOUS) ×6 IMPLANT
SUT ETHILON 3 0 PS 1 (SUTURE) IMPLANT
SUT PROLENE 5 0 C 1 24 (SUTURE) IMPLANT
SUT PROLENE 6 0 CC (SUTURE) IMPLANT
SUT PROLENE 7 0 BV 1 (SUTURE) IMPLANT
SUT SILK 2 0 FS (SUTURE) IMPLANT
SUT SILK 3 0 (SUTURE)
SUT SILK 3-0 18XBRD TIE 12 (SUTURE) IMPLANT
SUT VIC AB 2-0 SH 27 (SUTURE)
SUT VIC AB 2-0 SH 27XBRD (SUTURE) IMPLANT
SUT VIC AB 3-0 SH 27 (SUTURE)
SUT VIC AB 3-0 SH 27X BRD (SUTURE) IMPLANT
SUT VIC AB 4-0 PS2 27 (SUTURE) IMPLANT
SYR 10ML LL (SYRINGE) ×6 IMPLANT
SYR 20CC LL (SYRINGE) ×12 IMPLANT
SYR 30ML LL (SYRINGE) ×3 IMPLANT
SYR MEDRAD MARK V 150ML (SYRINGE) IMPLANT
TRAY FOLEY W/METER SILVER 16FR (SET/KITS/TRAYS/PACK) IMPLANT
TUBING CIL FLEX 10 FLL-RA (TUBING) IMPLANT
TUBING EXTENTION W/L.L. (IV SETS) IMPLANT
TUBING HIGH PRESSURE 120CM (CONNECTOR) ×3 IMPLANT
UNDERPAD 30X30 (UNDERPADS AND DIAPERS) IMPLANT
WATER STERILE IRR 1000ML POUR (IV SOLUTION) IMPLANT
WIRE BENTSON .035X145CM (WIRE) ×3 IMPLANT
WIRE MINI STICK MAX (SHEATH) ×3 IMPLANT

## 2016-12-06 NOTE — Progress Notes (Signed)
Pt was getting very worked up & anxious -  making herself nauseous; phenergan was given & sandbag applied to R groin for comfort. Pt now resting quietly; VSS; no changes noted to R groin site; pain alleviated; will transport to 2W. 2W RN is aware.

## 2016-12-06 NOTE — Progress Notes (Signed)
Pt's BP 225/95 at this time. Pt refusing PRN BP medication. Pt educated on importance of receiving medication through her IV to help lower her BP. Pt continued to refuse. Pt's primary RN notified.   Grant Fontana [facility]

## 2016-12-06 NOTE — Progress Notes (Signed)
Patient states that she was able to go outside during the day and she wants to go outside now (to smoke) or she is going home. MD notified- said she could not go and if she wants to leave it's on her. RN relayed the message and patient stated she wanted to go home. RN let patient know there is a paper she has to sign if she wants to leave AMA and she will have to call a ride to pick her up. Patient then asked surprisingly "So ya'll will let me go home now?", RN stated yes. Patient now does not want to leave, but she says she isn't taking any of her medication. Will check on patient and continue to monitor.

## 2016-12-06 NOTE — Op Note (Addendum)
Procedure: Abdominal aortogram with focused renal angiogram  Preoperative diagnosis: Hypertension  Postoperative diagnosis: Same  Anesthesia: Gen.  Operative findings: #1 normal aortic anatomy with no significant atherosclerosis #2 no significant renal artery stenosis bilaterally on multiple imaging views  Operative details: After obtaining informed consent, the patient was taken to the operating room. The patient was placed in supine position operating table. After induction of general anesthesia and endotracheal ablation, groins were prepped and draped in usual sterile fashion. Ultrasound was used to identify the right common femoral artery and femoral bifurcation. A micropuncture needle was then used to cannulate the right common femoral artery without difficulty. The micro-puncture wire was advanced into the right external iliac artery. The micropuncture sheath was then placed over this. The Michael puncture wire was then exchanged for an [employer] wire. The micropuncture sheath was removed and exchanged for a 6 French short sheath. This was thoroughly flushed with heparin saline. A 5 French Omni Flush catheter was then advanced over the guidewire into the abdominal aorta. Abdominal aortogram was then obtained in AP projection. This shows a patent celiac and superior mesenteric arteries were patent left and right renal arteries. There is no significant renal artery stenosis on the AP view. Next the Omni Flush catheter was pulled down to the level of the renal arteries at about L2 vertebral level. An additional injection was performed so that there was no obscuring of the view from mesenteric vessels and again this showed no significant left or right renal artery stenosis. I then proceeded to perform an oblique focused view of the left and right renal arteries by turning the tube to a 40 LAO followed by a 40 RAO projection. This again showed no significant left or right renal artery stenosis. At  this point the Omni Flush catheter was removed over a guidewire. The 6 French sheath was removed and hemostasis obtained with direct pressure. Patient was awakened in the operating room extubated and taken to the recovery room in stable condition.  Operative management: The patient has significant hypertension. She has no evidence of renal artery stenosis. No further vascular surgical intervention warranted at this time.  Ruta Hinds, MD [facility] [place] Office: [phone] Pager: [phone]

## 2016-12-06 NOTE — Anesthesia Procedure Notes (Signed)
Procedure Name: Intubation Date/Time: 12/06/2016 7:38 AM Performed by: Shirlyn Goltz Pre-anesthesia Checklist: Patient identified, Emergency Drugs available, Suction available and Patient being monitored Patient Re-evaluated:Patient Re-evaluated prior to inductionOxygen Delivery Method: Circle system utilized Preoxygenation: Pre-oxygenation with 100% oxygen Intubation Type: IV induction Ventilation: Mask ventilation without difficulty and Oral airway inserted - appropriate to patient size Laryngoscope Size: Mac and 3 Grade View: Grade I Tube type: Oral Tube size: 7.0 mm Number of attempts: 1 Airway Equipment and Method: Stylet Placement Confirmation: ETT inserted through vocal cords under direct vision,  positive ETCO2 and breath sounds checked- equal and bilateral Secured at: 21 cm Tube secured with: Tape Dental Injury: Teeth and Oropharynx as per pre-operative assessment

## 2016-12-06 NOTE — Care Management Obs Status (Signed)
[place] NOTIFICATION   Patient Details  Name: Katrina [name] MRN: [id number] Date of Birth: 1961-04-01   Medicare Observation Status Notification Given:  Yes    Dawayne Patricia, RN 12/06/2016, 3:18 PM

## 2016-12-06 NOTE — H&P (View-Only) (Signed)
  Progress Note    12/05/2016 9:49 AM Hospital Day 2  Subjective:  upset this morning and states that she has anxiety about being here and the procedure and wants to go home and upset that there is food allergies listed on her chart and she received those on her food tray.    Afebrile HR 70's-80's NSR 725'D-664'Q systolic (034'V yesterday afternoon) 97% RA  Vitals:   12/04/16 2118 12/05/16 0620  BP: (!) 193/104 (!) 188/93  Pulse: 78 82  Resp: 18 18  Temp: 98.6 F (37 C)     Physical Exam: General:  No acute distress  CBC    Component Value Date/Time   WBC 8.3 12/03/2016 1117   RBC 4.91 12/03/2016 1117   HGB 14.1 12/03/2016 1117   HCT 44.2 12/03/2016 1117   PLT 232 12/03/2016 1117   MCV 90.0 12/03/2016 1117   MCH 28.7 12/03/2016 1117   MCHC 31.9 12/03/2016 1117   RDW 15.3 12/03/2016 1117   LYMPHSABS 3.5 10/14/2016 2133   MONOABS 0.9 10/14/2016 2133   EOSABS 0.2 10/14/2016 2133   BASOSABS 0.0 10/14/2016 2133    BMET    Component Value Date/Time   NA 140 12/05/2016 0239   K 3.8 12/05/2016 0239   CL 110 12/05/2016 0239   CO2 24 12/05/2016 0239   GLUCOSE 118 (H) 12/05/2016 0239   BUN 15 12/05/2016 0239   CREATININE 1.02 (H) 12/05/2016 0239   CALCIUM 8.6 (L) 12/05/2016 0239   GFRNONAA >60 12/05/2016 0239   GFRAA >60 12/05/2016 0239    INR    Component Value Date/Time   INR 1.16 12/03/2016 1117     Intake/Output Summary (Last 24 hours) at 12/05/16 0949 Last data filed at 12/04/16 1822  Gross per 24 hour  Intake                0 ml  Output              400 ml  Net             -400 ml     Assessment/Plan:  56 y.o. female with bilateral renal artery stenosis Hospital Day 2  -pt upset this morning and states that she has anxiety about being here and the procedure and wants to go home. -upset that there is food allergies listed on her chart and she received those on her food tray.  -pt is npo after MN and consent ordered -Dr. Scot Dock will be in to  speak with pt later today.   Leontine Locket, PA-C [facility] [phone] 12/05/2016 9:49 AM  I have interviewed the patient and examined the patient. I agree with the findings by the PA. Blood pressure still poorly controlled. She is scheduled for arteriogram and possible bilateral renal artery angioplasty and stents. This will be done under general anesthetic. Her preop orders have been written.  Gae Gallop, MD [phone]

## 2016-12-06 NOTE — Care Management CC44 (Signed)
Condition Code 44 Documentation Completed  Patient Details  Name: Katrina [name] MRN: [id number] Date of Birth: May 12, 1961   Condition Code 44 given:  Yes Patient signature on Condition Code 44 notice:  Yes Documentation of 2 MD's agreement:  Yes Code 44 added to claim:  Yes    Dawayne Patricia, RN 12/06/2016, 3:18 PM

## 2016-12-06 NOTE — Transfer of Care (Signed)
Immediate Anesthesia Transfer of Care Note  Patient: Katrina [name]  Procedure(s) Performed: Procedure(s): ABDOMINAL ANGIOGRAM WITH RENAL ANGIOGRAM NOT SELECTIVE (Right)  Patient Location: PACU  Anesthesia Type:General  Level of Consciousness: awake, alert , oriented and patient cooperative  Airway & Oxygen Therapy: Patient Spontanous Breathing and Patient connected to nasal cannula oxygen  Post-op Assessment: Report given to RN and Post -op Vital signs reviewed and stable  Post vital signs: Reviewed and stable  Last Vitals:  Vitals:   12/05/16 2148 12/06/16 0534  BP: (!) 218/105 (!) 208/110  Pulse: 81 79  Resp: 18 18  Temp: 37.1 C 36.9 C    Last Pain:  Vitals:   12/06/16 0534  TempSrc: Oral  PainSc:          Complications: No apparent anesthesia complications

## 2016-12-06 NOTE — Progress Notes (Signed)
On patient's arrival to her room, she was offered her morning medications which she refused.  Patient stated "I'm not taking anything."  Patient's BP was noted to be 225/95. Patient refused any BP medication at this time.  MD notified.

## 2016-12-06 NOTE — Interval H&P Note (Signed)
History and Physical Interval Note:  12/06/2016 7:27 AM  Katrina [name]  has presented today for surgery, with the diagnosis of Renal Artery Stenosis I70.1  The various methods of treatment have been discussed with the patient and family. After consideration of risks, benefits and other options for treatment, the patient has consented to  Procedure(s): RENAL ANGIOGRAM WITH BILATERAL RENAL ARTERY ANGIOPLASTY AND STENTING (Bilateral) as a surgical intervention .  The patient's history has been reviewed, patient examined, no change in status, stable for surgery.  I have reviewed the patient's chart and labs.  Questions were answered to the patient's satisfaction.     Ruta Hinds

## 2016-12-06 NOTE — Anesthesia Postprocedure Evaluation (Signed)
Anesthesia Post Note  Patient: Katrina [name]  Procedure(s) Performed: Procedure(s) (LRB): ABDOMINAL ANGIOGRAM WITH RENAL ANGIOGRAM NOT SELECTIVE (Right)     Patient location during evaluation: PACU Anesthesia Type: General Level of consciousness: awake and alert Pain management: pain level controlled Vital Signs Assessment: post-procedure vital signs reviewed and stable Respiratory status: spontaneous breathing, nonlabored ventilation, respiratory function stable and patient connected to nasal cannula oxygen Cardiovascular status: blood pressure returned to baseline and stable Postop Assessment: no signs of nausea or vomiting Anesthetic complications: no    Last Vitals:  Vitals:   12/06/16 0856 12/06/16 0915  BP: (!) 163/83 (!) 157/84  Pulse: 84 78  Resp: 16 15  Temp: 36.6 C     Last Pain:  Vitals:   12/06/16 0534  TempSrc: Oral  PainSc:                  [name] S

## 2016-12-06 NOTE — Anesthesia Preprocedure Evaluation (Signed)
Anesthesia Evaluation  Patient identified by MRN, date of birth, ID band Patient awake    Reviewed: Allergy & Precautions, NPO status , Patient's Chart, lab work & pertinent test results  Airway Mallampati: II  TM Distance: >3 FB Neck ROM: Full    Dental no notable dental hx.    Pulmonary COPD, Current Smoker,    Pulmonary exam normal breath sounds clear to auscultation       Cardiovascular hypertension, + CAD and + Past MI  Normal cardiovascular exam Rhythm:Regular Rate:Normal     Neuro/Psych CVA negative psych ROS   GI/Hepatic Neg liver ROS, PUD, GERD  ,  Endo/Other  negative endocrine ROSdiabetes  Renal/GU negative Renal ROS  negative genitourinary   Musculoskeletal negative musculoskeletal ROS (+)   Abdominal   Peds negative pediatric ROS (+)  Hematology negative hematology ROS (+)   Anesthesia Other Findings   Reproductive/Obstetrics negative OB ROS                             Anesthesia Physical Anesthesia Plan  ASA: III  Anesthesia Plan: MAC   Post-op Pain Management:    Induction: Intravenous  PONV Risk Score and Plan: 1 and Ondansetron and Dexamethasone  Airway Management Planned: Simple Face Mask  Additional Equipment:   Intra-op Plan:   Post-operative Plan:   Informed Consent: I have reviewed the patients History and Physical, chart, labs and discussed the procedure including the risks, benefits and alternatives for the proposed anesthesia with the patient or authorized representative who has indicated his/her understanding and acceptance.   Dental advisory given  Plan Discussed with: CRNA and Surgeon  Anesthesia Plan Comments: (GA backup)        Anesthesia Quick Evaluation

## 2016-12-06 NOTE — Progress Notes (Signed)
Getting patient ready for transport back to [place] says she coughed and forgot to hold pressure. C/o "terrible pain" to R groin; crying; appears extremely anxious.  No changes noted to right groin site. Soft to touch. No bleeing or swelling. Palpable pulses. Vascular assessment WNL.  Full sensation to RLE & R foot.  Dr. Oneida Alar made aware. Emotional support given to patient. Will use a sandbag to R groin site for pt comfort.

## 2016-12-07 DIAGNOSIS — I10 Essential (primary) hypertension: Secondary | ICD-10-CM | POA: Diagnosis not present

## 2016-12-07 LAB — BASIC METABOLIC PANEL
Anion gap: 6 (ref 5–15)
BUN: 17 mg/dL (ref 6–20)
CHLORIDE: 109 mmol/L (ref 101–111)
CO2: 26 mmol/L (ref 22–32)
CREATININE: 0.98 mg/dL (ref 0.44–1.00)
Calcium: 8.8 mg/dL — ABNORMAL LOW (ref 8.9–10.3)
GFR calc Af Amer: >60 mL/min (ref >60)
GFR calc non Af Amer: >60 mL/min (ref >60)
GLUCOSE: 142 mg/dL — AB (ref 65–99)
Potassium: 4.5 mmol/L (ref 3.5–5.1)
Sodium: 141 mmol/L (ref 135–145)

## 2016-12-07 LAB — CBC
HCT: 40.2 % (ref 36.0–46.0)
Hemoglobin: 12.6 g/dL (ref 12.0–15.0)
MCH: 28.6 pg (ref 26.0–34.0)
MCHC: 31.3 g/dL (ref 30.0–36.0)
MCV: 91.4 fL (ref 78.0–100.0)
PLATELETS: 228 10*3/uL (ref 150–400)
RBC: 4.4 MIL/uL (ref 3.87–5.11)
RDW: 15.6 % — AB (ref 11.5–15.5)
WBC: 7.7 10*3/uL (ref 4.0–10.5)

## 2016-12-07 LAB — GLUCOSE, CAPILLARY
GLUCOSE-CAPILLARY: 122 mg/dL — AB (ref 65–99)
Glucose-Capillary: 155 mg/dL — ABNORMAL HIGH (ref 65–99)

## 2016-12-07 NOTE — Progress Notes (Addendum)
  Progress Note  SUBJECTIVE:    POD #1  Wants to go home. Didn't take BP meds yesterday b/c "I didn't need them."  OBJECTIVE:   Vitals:   12/06/16 2036 12/07/16 0607  BP: (!) 185/94 (!) 160/80  Pulse: 72 86  Resp: 18 18  Temp: 98.6 F (37 C) 98.1 F (36.7 C)    Intake/Output Summary (Last 24 hours) at 12/07/16 0932 Last data filed at 12/06/16 2300  Gross per 24 hour  Intake             1265 ml  Output                0 ml  Net             1265 ml   Right groin without hematoma Right foot warm  ASSESSMENT/PLAN:   56 y.o. female is s/p: Abdominal aortogram with focused renal angiogram 1 Day Post-Op   No significant renal artery stenosis bilaterally, therefore no intervention performed.  Patient continues to be noncompliant with BP meds and refused meds last night. She understands the risks associated with significant hypertension.  D/c home today. F/u as needed.  Alvia Grove 12/07/2016 9:32 AM --  Agree D/c home F/u Scot Dock if future vascular issues  Ruta Hinds, MD [facility] [place] Office: [phone] Pager: [phone]  LABS:   CBC    Component Value Date/Time   WBC 7.7 12/07/2016 0256   HGB 12.6 12/07/2016 0256   HCT 40.2 12/07/2016 0256   PLT 228 12/07/2016 0256    BMET    Component Value Date/Time   NA 141 12/07/2016 0256   K 4.5 12/07/2016 0256   CL 109 12/07/2016 0256   CO2 26 12/07/2016 0256   GLUCOSE 142 (H) 12/07/2016 0256   BUN 17 12/07/2016 0256   CREATININE 0.98 12/07/2016 0256   CALCIUM 8.8 (L) 12/07/2016 0256   GFRNONAA >60 12/07/2016 0256   GFRAA >60 12/07/2016 0256    COAG Lab Results  Component Value Date   INR 1.02 12/06/2016   INR 1.16 12/03/2016   INR 1.00 04/02/2016   No results found for: PTT  ANTIBIOTICS:   Anti-infectives    Start     Dose/Rate Route Frequency Ordered Stop   12/06/16 0600  vancomycin (VANCOCIN) IVPB 1000 mg/200 mL premix  Status:  Discontinued     1,000 mg 200 mL/hr over 60 Minutes Intravenous On call to O.R. 12/05/16 0941 12/06/16 [place], PA-C [facility] Office: [phone] Pager: [phone] 12/07/2016 9:32 AM

## 2016-12-07 NOTE — Progress Notes (Signed)
Pt refusing all medications

## 2016-12-09 NOTE — Discharge Summary (Signed)
[facility] Discharge Summary  Katrina [name] [birth year] 56 y.o. female  [id number]  Admission Date: 12/03/2016  Discharge Date: 12/07/2016  Physician: Ruta Hinds, MD  Admission Diagnosis: Hypertension [I10]  HPI:  This is a 56 y.o. female Who I saw in consultation on 11/04/2016. She was Referred for evaluation of poorly controlled blood pressure. She tells me that she has had very poorly controlled blood pressure really since 2008. She tells me that her blood pressure systolic consistently runs above 200. She is on multiple medications for blood pressure and most recently had a renal artery duplex which suggested possible bilateral renal artery stenoses. The patient was sent for vascular consultation.  The patient denies any headaches, hematuria or flank pain. She is on Norvasc, Catapres patch, and Cozaar for her blood pressure which is still poorly controlled.   Her risk factors for peripheral vascular disease include hypertension, heavy tobacco use, and a family history of premature cardiovascular disease. She denies any history of diabetes or hypercholesterolemia.  She does tell me that she had a silent heart attack in 2000. She has stable angina. She had a heart catheter couple years ago according to the patient. I cannot find records of this cath I believe this was done elsewhere.  She is on Plavix and is not on aspirin.  She smokes 2 packs per day and has been smoking for over 40 years.  She came in today for aortogram and possible bilateral renal artery angioplasty and stenting. Her blood pressure was poorly controlled however we felt it was indicated to proceed. However. She was extremely anxious and unable to hold still. I did not think that the procedure could be performed safely. For this reason, I recommended admission to the hospital for blood pressure control and we will reschedule her for her procedure on Monday in the operating room under general  anesthesia.  Hospital Course:  The patient was admitted to the hospital on 12/03/2016. Plans were made for a renal angiogram. However the patient was extremely anxious and on able to hold still. Therefore Dr. Scot Dock felt that the procedure cannot be performed safely. She was admitted to the hospital for blood pressure control with plans to reschedule her procedure on Monday 12/06/2016 under general anesthesia.  The hospitalist service was consult for hypertension management.  She was taken to the operating room on 12/06/2016 underwent abdominal aortogram with focused renal angiogram. This showed normal aortic anatomy with no significant atherosclerosis. There was also no significant renal artery stenosis bilaterally. Therefore, no vascular intervention was indicated.  Overnight, the patient refused her blood pressure medications. She felt that she did not "need them."  On postoperative day 1, her right groin sheath site was soft without hematoma. She was discharged home on postop day 1 in good condition.   CBC    Component Value Date/Time   WBC 7.7 12/07/2016 0256   RBC 4.40 12/07/2016 0256   HGB 12.6 12/07/2016 0256   HCT 40.2 12/07/2016 0256   PLT 228 12/07/2016 0256   MCV 91.4 12/07/2016 0256   MCH 28.6 12/07/2016 0256   MCHC 31.3 12/07/2016 0256   RDW 15.6 (H) 12/07/2016 0256   LYMPHSABS 3.5 10/14/2016 2133   MONOABS 0.9 10/14/2016 2133   EOSABS 0.2 10/14/2016 2133   BASOSABS 0.0 10/14/2016 2133    BMET    Component Value Date/Time   NA 141 12/07/2016 0256   K 4.5 12/07/2016 0256   CL 109 12/07/2016 0256   CO2 26 12/07/2016  0256   GLUCOSE 142 (H) 12/07/2016 0256   BUN 17 12/07/2016 0256   CREATININE 0.98 12/07/2016 0256   CALCIUM 8.8 (L) 12/07/2016 0256   GFRNONAA >60 12/07/2016 0256   GFRAA >60 12/07/2016 0256     Discharge Instructions:   The patient is discharged to home with extensive instructions on wound care and progressive ambulation.  They are instructed  not to drive or perform any heavy lifting until returning to see the physician in his office.  Discharge Instructions    Activity as tolerated - No restrictions    Complete by:  As directed    Call MD for:  redness, tenderness, or signs of infection (pain, swelling, bleeding, redness, odor or green/yellow discharge around incision site)    Complete by:  As directed    Call MD for:  severe or increased pain, loss or decreased feeling  in affected limb(s)    Complete by:  As directed    Call MD for:  temperature >100.5    Complete by:  As directed    Discharge wound care:    Complete by:  As directed    Take dressing off right groin when you get home. Shower as normal. You do not have to reapply a dressing.   Lifting restrictions    Complete by:  As directed    No lifting for 1 week   Resume previous diet    Complete by:  As directed       Discharge Diagnosis:  Hypertension [I10]  Secondary Diagnosis: Patient Active Problem List   Diagnosis Date Noted  . Hypertension 12/03/2016  . Adverse food reaction 10/14/2016  . Pulmonary nodules 10/14/2016  . Non-seasonal allergic rhinitis due to fungal spores 10/14/2016  . Cigarette smoker 09/24/2016  . Cerebral microvascular disease 08/19/2016  . Non compliance with medical treatment 05/11/2016  . Leg pain, anterior, right 05/11/2016  . Syncope 05/02/2016  . Type 2 diabetes mellitus with diabetic neuropathy ([place]) 05/02/2016  . Restless leg syndrome 05/02/2016  . Cerebrovascular accident (CVA) ([place])   . Vascular headache   . History of chest pain   . Generalized anxiety disorder   . Chronic bilateral low back pain without sciatica   . Peripheral neuropathy   . Migraine without status migrainosus, not intractable   . PTSD (post-traumatic stress disorder)   . History of cerebrovascular accident (CVA) with residual deficit   . CVA (cerebral vascular accident) ([place]) 04/03/2016  . Stroke (cerebrum) ([place]) 04/03/2016  . Post traumatic  stress disorder 10/06/2015  . Precordial pain   . Migraine syndrome 01/24/2015  . Migraine 01/24/2015  . TIA (transient ischemic attack) 12/25/2014  . Hypertensive urgency   . Other chest pain   . Malignant hypertension   . Hypokalemia 04/16/2014  . Muscle weakness (generalized) 04/05/2014  . Stiffness of left hip joint 04/05/2014  . Pain in left hip 04/05/2014  . Diastolic dysfunction 91/79/1505  . Normal coronary arteries 03/20/2014  . PUD (peptic ulcer disease) 03/20/2014  . Type 2 diabetes, uncontrolled, with neuropathy ([place]) 03/20/2014  . Morbid obesity due to excess calories ([place]) 03/20/2014  . Left-sided weakness 03/20/2014  . Paresthesias 03/20/2014  . Cerebrovascular disease 03/20/2014  . Dyspnea on exertion 03/05/2012  . Tobacco abuse 03/05/2012  . Moderate COPD (chronic obstructive pulmonary disease) ([place]) 11/23/2011  . GERD (gastroesophageal reflux disease) 11/23/2011  . Uncontrolled hypertension 11/23/2011   Past Medical History:  Diagnosis Date  . Allergy   . Anemia 1975-1976   .  Anginal pain ([place])   . Anxiety    takes Valium daily as needed  . Arthritis    "spine" (12/03/2016)  . Asthma    has inhalers but doesn't use (12/03/2016)  . CAD (coronary artery disease)    Moderate nonobstructive 2012-2013, [place]; No angiographic evidence of CAD 01/31/15 [facility]  . Chronic bronchitis ([place])    "get it a couple times q yr" (12/03/2016)  . Chronic kidney disease   . Chronic lower back pain    budlging disc   . Claustrophobia   . COPD (chronic obstructive pulmonary disease) ([place])   . Daily headache   . Depression   . Diverticulitis   . Family history of adverse reaction to anesthesia    2 daughters gets extremely sick   . GERD (gastroesophageal reflux disease)    takes Dexilant daily  . Heart murmur   . History of blood transfusion 1975-1976 "several"   no abnormal reaction noted  . History of colitis   . History of colon polyps    benign  . History of  gastric ulcer   . History of hiatal hernia    "small one"  . History of MRSA infection 2017  . Hyperlipidemia    was on meds but has been off over a yr  . Hypertension    takes Amlodipine and Maxzide daily  . Insomnia    takes Ambien nightly  . Iron deficiency anemia    "when I was young"  . Lung nodules   . Microscopic hematuria   . Migraine    "2-3/wk" (12/03/2016)  . MS (multiple sclerosis) ([place])    questionable per pt  . Myocardial infarction ([place]) 2000   "mild one"  . Osteoporosis   . Peripheral neuropathy    weakness,numbness,and tingling. Takes Gabapentin daily  . Pneumonia 06/2015  . PTSD (post-traumatic stress disorder) dx'd 2016/2017   "was dx'd w/bipolar in 1991; replaced w/PTSD dx 2016/2017" (12/03/2016)  . Restless leg syndrome    takes Requip daily  . 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 Type II diabetes mellitus ([place])    "went off insulin in 2012/2013" (12/03/2016)     Allergies as of 12/07/2016      Reactions   Other Anaphylaxis, Swelling   [place]   Penicillins Anaphylaxis   Has patient had a PCN reaction causing immediate rash, facial/tongue/throat swelling, SOB or lightheadedness with hypotension: Yes Has patient had a PCN reaction causing severe rash involving mucus membranes or skin necrosis: Yes Has patient had a PCN reaction that required hospitalization Yes Has patient had a PCN reaction occurring within the last 10 years: No If all of the above answers are "NO", then may proceed with Cephalosporin use.   Zithromax [azithromycin] Anaphylaxis   Aspirin Other (See Comments)   Due to stomach ulcers.    Pineapple Rash   Strawberry Extract Rash, Hives   Wheat Bran Other (See Comments)   Sneezing, rhinorrhea   Yeast-related Products Other (See Comments)   Sneezing, rhinorrhea   Aspartame And Phenylalanine Palpitations   Mushroom Extract Complex Rash   Nicardipine  Nausea And Vomiting, Other (See Comments)   shaking      Medication List    STOP taking these medications   fluconazole 200 MG tablet Commonly known as:  DIFLUCAN   lidocaine 2 % solution Commonly known as:  XYLOCAINE   ondansetron 4 MG disintegrating tablet  Commonly known as:  ZOFRAN ODT     TAKE these medications   amLODipine 10 MG tablet Commonly known as:  NORVASC Take 1 tablet (10 mg total) by mouth daily.   aspirin-acetaminophen-caffeine 250-250-65 MG tablet Commonly known as:  EXCEDRIN MIGRAINE Take 2 tablets by mouth daily as needed for migraine.   chlorthalidone 25 MG tablet Commonly known as:  HYGROTON Take 25 mg by mouth 2 (two) times daily.   clopidogrel 75 MG tablet Commonly known as:  PLAVIX Take 75 mg by mouth daily.   dexlansoprazole 60 MG capsule Commonly known as:  DEXILANT Take 1 tab by mouth every morning.   diazepam 5 MG tablet Commonly known as:  VALIUM Take 1 tablet (5 mg total) by mouth every 8 (eight) hours as needed for anxiety.   gabapentin 600 MG tablet Commonly known as:  NEURONTIN Take 600 mg by mouth 4 (four) times daily.   losartan 50 MG tablet Commonly known as:  COZAAR Take 50 mg by mouth 2 (two) times daily.   oxyCODONE-acetaminophen 7.5-325 MG tablet Commonly known as:  PERCOCET Take 1 tablet by mouth every 6 (six) hours as needed (for pain.).   rOPINIRole 4 MG tablet Commonly known as:  REQUIP Take 4 mg by mouth at bedtime.   zolpidem 10 MG tablet Commonly known as:  AMBIEN Take 10 mg by mouth at bedtime.       Disposition: home  Patient's condition: is Good  Follow up: 1. Dr. Scot Dock prn   Virgina Jock, PA-C [facility] [phone] 12/09/2016  11:52 AM

## 2016-12-23 ENCOUNTER — Emergency Department (HOSPITAL_COMMUNITY)
Admission: EM | Admit: 2016-12-23 | Discharge: 2016-12-23 | Disposition: A | Discharge status: Home / Self Care | Payer: Medicare Other | Attending: Emergency Medicine | Admitting: Emergency Medicine

## 2016-12-23 ENCOUNTER — Encounter (HOSPITAL_COMMUNITY): Payer: Self-pay | Admitting: Emergency Medicine

## 2016-12-23 DIAGNOSIS — I2510 Atherosclerotic heart disease of native coronary artery without angina pectoris: Secondary | ICD-10-CM | POA: Diagnosis not present

## 2016-12-23 DIAGNOSIS — Z7902 Long term (current) use of antithrombotics/antiplatelets: Secondary | ICD-10-CM | POA: Insufficient documentation

## 2016-12-23 DIAGNOSIS — F17210 Nicotine dependence, cigarettes, uncomplicated: Secondary | ICD-10-CM | POA: Diagnosis not present

## 2016-12-23 DIAGNOSIS — L02212 Cutaneous abscess of back [any part, except buttock]: Secondary | ICD-10-CM | POA: Diagnosis present

## 2016-12-23 DIAGNOSIS — E1122 Type 2 diabetes mellitus with diabetic chronic kidney disease: Secondary | ICD-10-CM | POA: Insufficient documentation

## 2016-12-23 DIAGNOSIS — I129 Hypertensive chronic kidney disease with stage 1 through stage 4 chronic kidney disease, or unspecified chronic kidney disease: Secondary | ICD-10-CM | POA: Diagnosis not present

## 2016-12-23 DIAGNOSIS — N189 Chronic kidney disease, unspecified: Secondary | ICD-10-CM | POA: Insufficient documentation

## 2016-12-23 DIAGNOSIS — J45909 Unspecified asthma, uncomplicated: Secondary | ICD-10-CM | POA: Insufficient documentation

## 2016-12-23 DIAGNOSIS — Z79899 Other long term (current) drug therapy: Secondary | ICD-10-CM | POA: Diagnosis not present

## 2016-12-23 DIAGNOSIS — J449 Chronic obstructive pulmonary disease, unspecified: Secondary | ICD-10-CM | POA: Diagnosis not present

## 2016-12-23 DIAGNOSIS — L0291 Cutaneous abscess, unspecified: Secondary | ICD-10-CM

## 2016-12-23 MED ORDER — DOXYCYCLINE HYCLATE 100 MG PO TABS
100.0000 mg | ORAL_TABLET | Freq: Once | ORAL | Status: AC
  Administered 2016-12-23: 100 mg via ORAL
  Filled 2016-12-23: qty 1

## 2016-12-23 MED ORDER — DOXYCYCLINE HYCLATE 100 MG PO CAPS: 100.0000 mg | ORAL_CAPSULE | Freq: Two times a day (BID) | ORAL | 0 refills | Status: DC

## 2016-12-23 MED ORDER — LIDOCAINE HCL (PF) 1 % IJ SOLN
5.0000 mL | Freq: Once | INTRAMUSCULAR | Status: AC
  Administered 2016-12-23: 5 mL
  Filled 2016-12-23: qty 5

## 2016-12-23 MED ORDER — POVIDONE-IODINE 10 % EX SOLN
CUTANEOUS | Status: AC
  Administered 2016-12-23: 22:00:00
  Filled 2016-12-23: qty 15

## 2016-12-23 NOTE — ED Provider Notes (Signed)
[facility] DEPT Provider Note   CSN: [id number] Arrival date & time: 12/23/16  1909     History   Chief Complaint Chief Complaint  Patient presents with  . Abscess  . Hypertension    HPI Katrina [name] is a 56 y.o. female.  She noticed pain swelling and drainage from a sore on her upper back, today.  After that she began to be nauseated but has not vomited.  She denies fever, chills, chest pain, weakness or dizziness.  No recent similar problems.  There are no other known modifying factors.  HPI  Past Medical History:  Diagnosis Date  . Allergy   . Anemia 1975-1976   . Anginal pain ([place])   . Anxiety    takes Valium daily as needed  . Arthritis    "spine" (12/03/2016)  . Asthma    has inhalers but doesn't use (12/03/2016)  . CAD (coronary artery disease)    Moderate nonobstructive 2012-2013, [place]; No angiographic evidence of CAD 01/31/15 [facility]  . Chronic bronchitis ([place])    "get it a couple times q yr" (12/03/2016)  . Chronic kidney disease   . Chronic lower back pain    budlging disc   . Claustrophobia   . COPD (chronic obstructive pulmonary disease) ([place])   . Daily headache   . Depression   . Diverticulitis   . Family history of adverse reaction to anesthesia    2 daughters gets extremely sick   . GERD (gastroesophageal reflux disease)    takes Dexilant daily  . Heart murmur   . History of blood transfusion 1975-1976 "several"   no abnormal reaction noted  . History of colitis   . History of colon polyps    benign  . History of gastric ulcer   . History of hiatal hernia    "small one"  . History of MRSA infection 2017  . Hyperlipidemia    was on meds but has been off over a yr  . Hypertension    takes Amlodipine and Maxzide daily  . Insomnia    takes Ambien nightly  . Iron deficiency anemia    "when I was young"  . Lung nodules   . Microscopic hematuria   . Migraine    "2-3/wk" (12/03/2016)  . MS (multiple sclerosis) ([place])    questionable per  pt  . Myocardial infarction ([place]) 2000   "mild one"  . Osteoporosis   . Peripheral neuropathy    weakness,numbness,and tingling. Takes Gabapentin daily  . Pneumonia 06/2015  . PTSD (post-traumatic stress disorder) dx'd 2016/2017   "was dx'd w/bipolar in 1991; replaced w/PTSD dx 2016/2017" (12/03/2016)  . Restless leg syndrome    takes Requip daily  . 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 Type II diabetes mellitus ([place])    "went off insulin in 2012/2013" (12/03/2016)    Patient Active Problem List   Diagnosis Date Noted  . Hypertension 12/03/2016  . Adverse food reaction 10/14/2016  . Pulmonary nodules 10/14/2016  . Non-seasonal allergic rhinitis due to fungal spores 10/14/2016  . Cigarette smoker 09/24/2016  . Cerebral microvascular disease 08/19/2016  . Non compliance with medical treatment 05/11/2016  . Leg pain, anterior, right 05/11/2016  . Syncope 05/02/2016  . Type 2 diabetes mellitus with diabetic neuropathy ([place]) 05/02/2016  . Restless leg syndrome 05/02/2016  . Cerebrovascular accident (CVA) ([place])   . Vascular headache   . History  of chest pain   . Generalized anxiety disorder   . Chronic bilateral low back pain without sciatica   . Peripheral neuropathy   . Migraine without status migrainosus, not intractable   . PTSD (post-traumatic stress disorder)   . History of cerebrovascular accident (CVA) with residual deficit   . CVA (cerebral vascular accident) ([place]) 04/03/2016  . Stroke (cerebrum) ([place]) 04/03/2016  . Post traumatic stress disorder 10/06/2015  . Precordial pain   . Migraine syndrome 01/24/2015  . Migraine 01/24/2015  . TIA (transient ischemic attack) 12/25/2014  . Hypertensive urgency   . Other chest pain   . Malignant hypertension   . Hypokalemia 04/16/2014  . Muscle weakness (generalized) 04/05/2014  . Stiffness of left hip joint 04/05/2014  . Pain in left hip  04/05/2014  . Diastolic dysfunction 14/48/1856  . Normal coronary arteries 03/20/2014  . PUD (peptic ulcer disease) 03/20/2014  . Type 2 diabetes, uncontrolled, with neuropathy ([place]) 03/20/2014  . Morbid obesity due to excess calories ([place]) 03/20/2014  . Left-sided weakness 03/20/2014  . Paresthesias 03/20/2014  . Cerebrovascular disease 03/20/2014  . Dyspnea on exertion 03/05/2012  . Tobacco abuse 03/05/2012  . Moderate COPD (chronic obstructive pulmonary disease) ([place]) 11/23/2011  . GERD (gastroesophageal reflux disease) 11/23/2011  . Uncontrolled hypertension 11/23/2011    Past Surgical History:  Procedure Laterality Date  . ABDOMINAL AORTOGRAM N/A 12/03/2016   Procedure: Abdominal Aortogram;  Surgeon: Angelia Mould, MD;  Location: [facility];  Service: Cardiovascular;  Laterality: N/A;  . ABDOMINAL HYSTERECTOMY  12/1987  . ADENOIDECTOMY  1975  . ANKLE SURGERY Bilateral 1993; 1995 X2   "stabilzation done; 1 on the right; 2 on the left"  . APPENDECTOMY  1989  . BACK SURGERY    . BREAST LUMPECTOMY Left    "benign tumor"  . CARDIAC CATHETERIZATION N/A 01/31/2015   Procedure: Left Heart Cath and Coronary Angiography;  Surgeon: Burnell Blanks, MD;  Location: [facility];  Service: Cardiovascular;  Laterality: N/A;  . CARDIAC CATHETERIZATION  2009; 2011  . COLONOSCOPY    . DILATION AND CURETTAGE OF UTERUS    . ESOPHAGOGASTRODUODENOSCOPY    . LAPAROSCOPIC CHOLECYSTECTOMY  1996  . NASAL RECONSTRUCTION  1976  . NASAL SINUS SURGERY  1975  . POSTERIOR LUMBAR FUSION  2005  . RADIOLOGY WITH ANESTHESIA N/A 01/15/2016   Procedure: MRI LUMBAR SPINE WITHOUT;  Surgeon: Medication Radiologist, MD;  Location: [facility];  Service: Radiology;  Laterality: N/A;  . RADIOLOGY WITH ANESTHESIA N/A 06/29/2016   Procedure: MRI OF THE BRAIN WITH AND WITHOUT;  Surgeon: Medication Radiologist, MD;  Location: [facility];  Service: Radiology;  Laterality: N/A;  . RENAL ANGIOGRAPHY N/A  12/03/2016   Procedure: Renal Angiography;  Surgeon: Angelia Mould, MD;  Location: [facility];  Service: Cardiovascular;  Laterality: N/A;  . TONSILLECTOMY  1992  . TUBAL LIGATION    . 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 Authorizing Provider  amLODipine (NORVASC) 10 MG tablet Take 1 tablet (10 mg total) by mouth daily. 06/05/14   Herminio Commons, MD  aspirin-acetaminophen-caffeine (EXCEDRIN MIGRAINE) [phone] MG tablet Take 2 tablets by mouth daily as needed for migraine.  [provider]  chlorthalidone (HYGROTON) 25 MG tablet Take 25 mg by mouth 2 (two) times daily.     [provider]  clopidogrel (PLAVIX) 75 MG tablet Take 75 mg by mouth daily.    [provider]  dexlansoprazole (DEXILANT) 60 MG capsule Take 1 tab by mouth every morning. 10/18/16   Esterwood, Amy S, PA-C  diazepam (VALIUM) 5 MG tablet Take 1 tablet (5 mg total) by mouth every 8 (eight) hours as needed for anxiety. 05/04/16   Orvan Falconer, MD  doxycycline (VIBRAMYCIN) 100 MG capsule Take 1 capsule (100 mg total) by mouth 2 (two) times daily. One po bid x 7 days 12/23/16   Daleen Bo, MD  gabapentin (NEURONTIN) 600 MG tablet Take 600 mg by mouth 4 (four) times daily.    [provider]  losartan (COZAAR) 50 MG tablet Take 50 mg by mouth 2 (two) times daily.  06/09/16   [provider]  oxyCODONE-acetaminophen (PERCOCET) 7.5-325 MG tablet Take 1 tablet by mouth every 6 (six) hours as needed (for pain.).  03/25/16   [provider]  rOPINIRole (REQUIP) 4 MG tablet Take 4 mg by mouth at bedtime.    [provider]  zolpidem (AMBIEN) 10 MG tablet Take 10 mg by mouth at bedtime.  03/25/16   [provider]    Family History Family  History  Problem Relation Age of Onset  . Coronary artery disease Father   . Emphysema Father   . Heart attack Father   . Stroke Father   . Cancer Father        Unsure of type   . Allergic rhinitis Father   . Depression Mother   . Cancer Mother        skin cancer  . Bipolar disorder Brother   . Drug abuse Brother   . Cancer Brother        leukemia  . Bipolar disorder Daughter   . Allergic rhinitis Daughter   . Asthma Daughter   . Urticaria Daughter   . Diabetes Maternal Grandmother   . Stomach cancer Maternal Grandfather   . Cancer Maternal Grandfather        stomach   . Angioedema Neg Hx   . Atopy Neg Hx   . Eczema Neg Hx   . Immunodeficiency Neg Hx     Social History Social History  Substance Use Topics  . Smoking status: Current Every Day Smoker    Packs/day: 1.50    Years: 45.00    Types: Cigarettes    Start date: 03/04/1971  . Smokeless tobacco: Never Used  . Alcohol use No     Allergies   Other; Penicillins; Zithromax [azithromycin]; Aspirin; Pineapple; Strawberry extract; Wheat bran; Yeast-related products; Aspartame and phenylalanine; Mushroom extract complex; and Nicardipine   Review of Systems Review of Systems  All other systems reviewed and are negative.    Physical Exam Updated Vital Signs BP (!) 218/115 (BP Location: Left Wrist)   Pulse 72   Temp 98.5 F (36.9 C) (Oral)   Resp 17   Ht 5\' 6"  (1.676 m)   Wt 109.3 kg (241 lb)   SpO2 100%   BMI 38.90 kg/m   Physical Exam  Constitutional: She is oriented to person, place, and time. She appears well-developed and well-nourished.  HENT:  Head: Normocephalic and atraumatic.  Eyes: Pupils are equal, round, and reactive to light. Conjunctivae and EOM are normal.  Neck: Normal range of motion and phonation normal.  Neck supple.  Cardiovascular: Normal rate.   Pulmonary/Chest: Effort normal.  Musculoskeletal: Normal range of motion.  Neurological: She is alert and oriented to person, place,  and time. She exhibits normal muscle tone.  Skin: Skin is warm and dry.  Carbuncle, right upper back, with small hole draining purulent material.  Indurated area 2-1/2 cm.  Moderate tenderness to palpation.  Psychiatric: She has a normal mood and affect. Her behavior is normal. Judgment and thought content normal.  Nursing note and vitals reviewed.    ED Treatments / Results  Labs (all labs ordered are listed, but only abnormal results are displayed) Labs Reviewed - No data to display  EKG  EKG Interpretation None       Radiology No results found.  Procedures .Marland KitchenIncision and Drainage Date/Time: 12/25/2016 4:14 PM Performed by: Daleen Bo Authorized by: Daleen Bo   Consent:    Consent obtained:  Verbal   Consent given by:  Patient   Risks discussed:  Pain   Alternatives discussed:  No treatment Location:    Type:  Abscess   Size:  3 cm   Location:  Trunk   Trunk location:  Back Pre-procedure details:    Skin preparation:  Betadine Anesthesia (see MAR for exact dosages):    Anesthesia method:  Local infiltration   Local anesthetic:  Lidocaine 1% w/o epi Procedure type:    Complexity:  Simple Procedure details:    Needle aspiration: no     Incision types:  Single straight   Incision depth:  Subcutaneous   Scalpel blade:  11   Wound management:  Probed and deloculated   Drainage:  Purulent   Drainage amount:  Scant   Wound treatment:  Wound left open   Packing materials:  None Post-procedure details:    Patient tolerance of procedure:  Tolerated well, no immediate complications   (including critical care time)  Medications Ordered in ED Medications  lidocaine (PF) (XYLOCAINE) 1 % injection 5 mL (5 mLs Infiltration Given 12/23/16 2150)  povidone-iodine (BETADINE) 10 % external solution (  Given by Other 12/23/16 2201)  doxycycline (VIBRA-TABS) tablet 100 mg (100 mg Oral Given 12/23/16 2222)     Initial Impression / Assessment and Plan / ED Course    I have reviewed the triage vital signs and the nursing notes.  Pertinent labs & imaging results that were available during my care of the patient were reviewed by me and considered in my medical decision making (see chart for details).      No data found.   At D/C Reevaluation with update and discussion. After initial assessment and treatment, an updated evaluation reveals no further complaints, findings discussed with patient and all questions answered. Hasan Douse L    Final Clinical Impressions(s) / ED Diagnoses   Final diagnoses:  Abscess   Abscessed trunk, without signs for spreading infection or systemic illness.  Nursing Notes Reviewed/ Care Coordinated Applicable Imaging Reviewed Interpretation of Laboratory Data incorporated into ED treatment  The patient appears reasonably screened and/or stabilized for discharge and I doubt any other medical condition or other [facility] requiring further screening, evaluation, or treatment in the ED at this time prior to discharge.  Plan: Home Medications-continue usual medications; Home Treatments-warm compresses multiple times each day; return here if the recommended treatment, does not improve the symptoms; Recommended follow up-PCP checkup 1 week.   New Prescriptions Discharge Medication List as of 12/23/2016 10:42 PM    START taking these medications   Details  doxycycline (  VIBRAMYCIN) 100 MG capsule Take 1 capsule (100 mg total) by mouth 2 (two) times daily. One po bid x 7 days, Starting Thu 12/23/2016, Print         Daleen Bo, MD 12/25/16 1616

## 2016-12-23 NOTE — Discharge Instructions (Signed)
Use a warm moist compress on the sore area 3 or 4 times a day, until the discomfort resolves.

## 2016-12-23 NOTE — ED Triage Notes (Signed)
Pt c/o abscess to the upper middle back that she noticed today. Pt states it was draining green substance today that made her nauseous.

## 2017-01-04 ENCOUNTER — Ambulatory Visit: Payer: Medicare Other | Admitting: Allergy & Immunology

## 2017-01-21 MED ORDER — ROCURONIUM BROMIDE 100 MG/10ML IV SOLN
INTRAVENOUS | Status: DC | PRN
  Administered 2016-12-06: 50 mg via INTRAVENOUS

## 2017-01-21 NOTE — Addendum Note (Signed)
Addendum  created 01/21/17 1235 by Josephine Igo, CRNA   Anesthesia Intra Meds edited

## 2017-02-14 IMAGING — DX DG ANKLE COMPLETE 3+V*R*
3 series · 3 of 3 positions shown · non-contrast
Comparison: None.

CLINICAL DATA: Fall.  Pain.

EXAM:
RIGHT ANKLE - COMPLETE 3+ VIEW

[ankle ap]
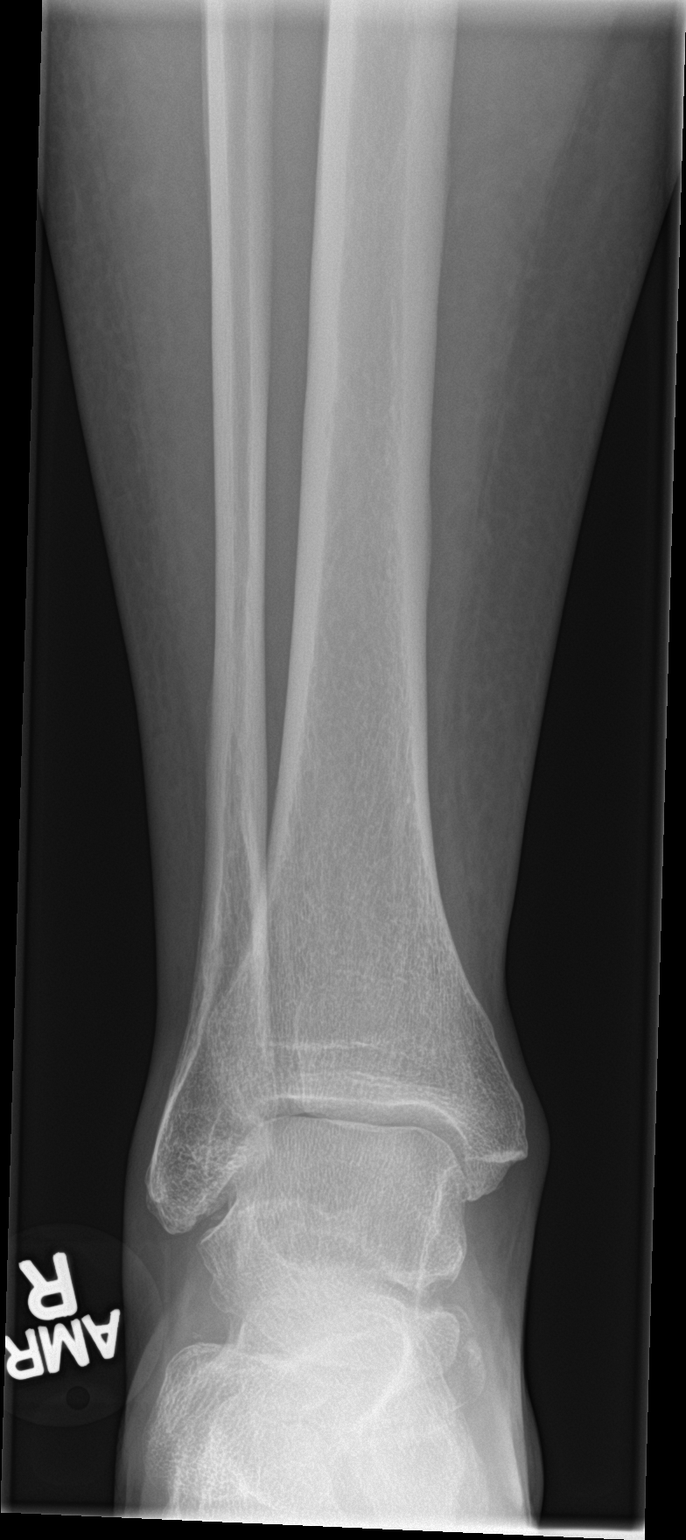

[ankle obl]
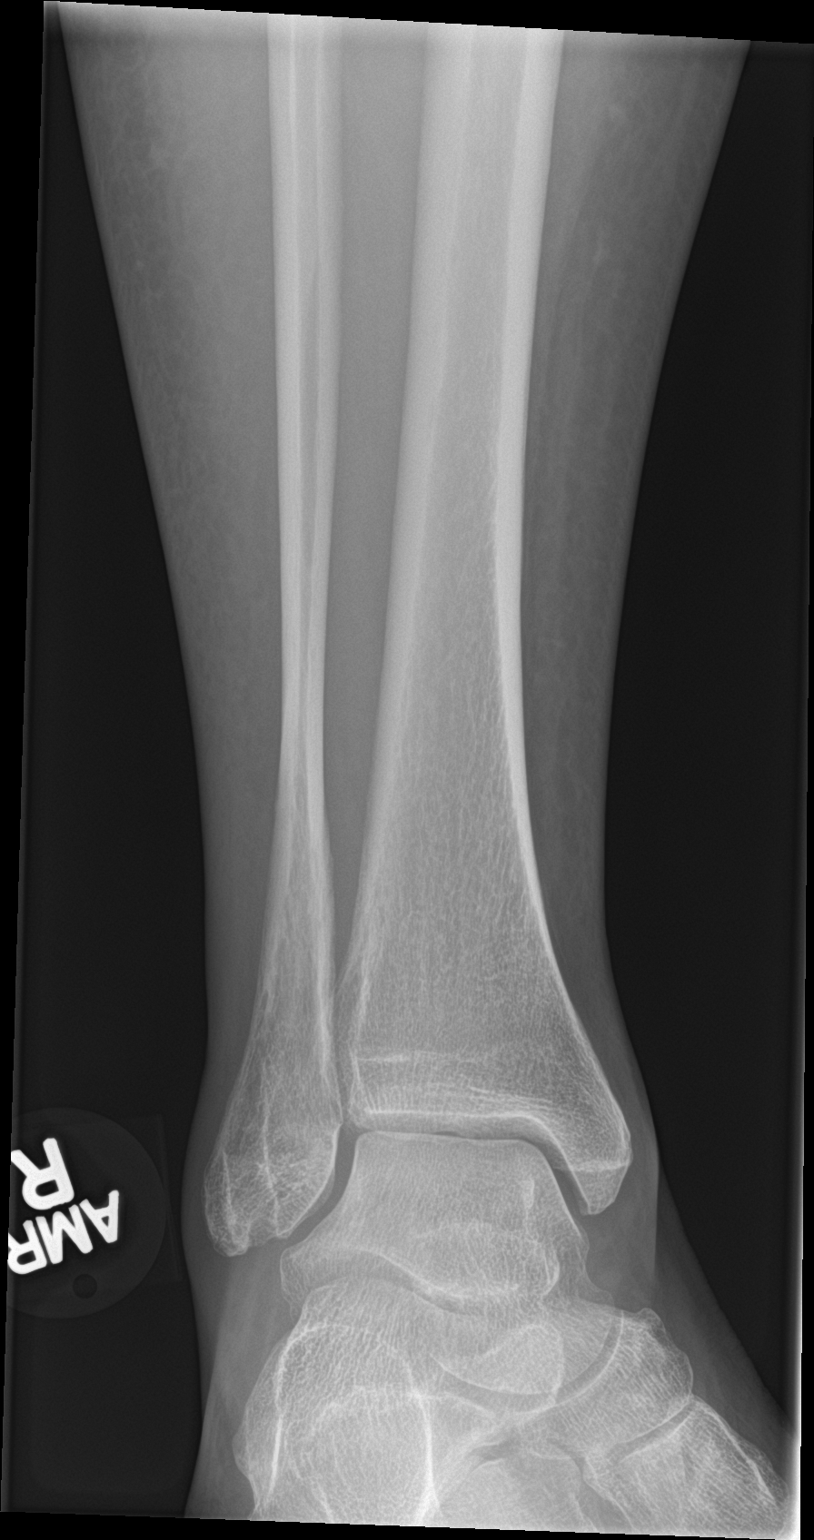

[ankle lat]
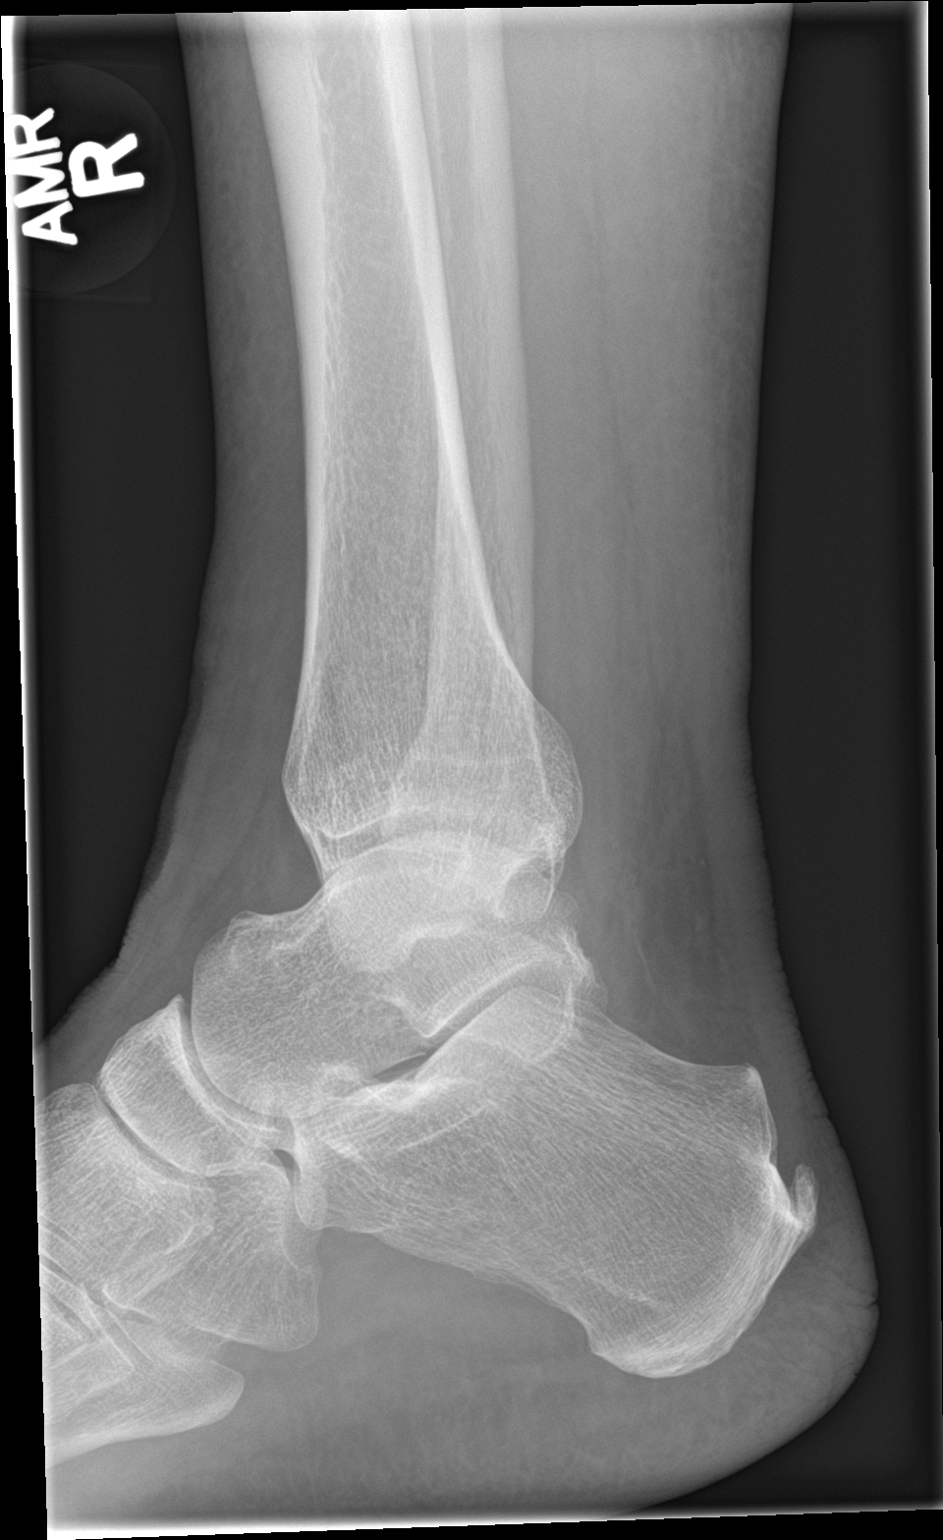

[3 of 3 positions shown; findings below may reference images not displayed]

FINDINGS: There is no evidence of fracture, dislocation, or joint effusion.
There is no evidence of arthropathy or other focal bone abnormality.
Soft tissues are unremarkable. Calcification in the distal Achilles
tendon.
IMPRESSION: Negative.

## 2017-02-14 IMAGING — DX DG SHOULDER 2+V*R*
3 series · 3 of 3 positions shown · non-contrast
Comparison: None.

CLINICAL DATA: Fell down about 10 steps today, right shoulder pain

EXAM:
RIGHT SHOULDER - 2+ VIEW

[shoulder grashey]
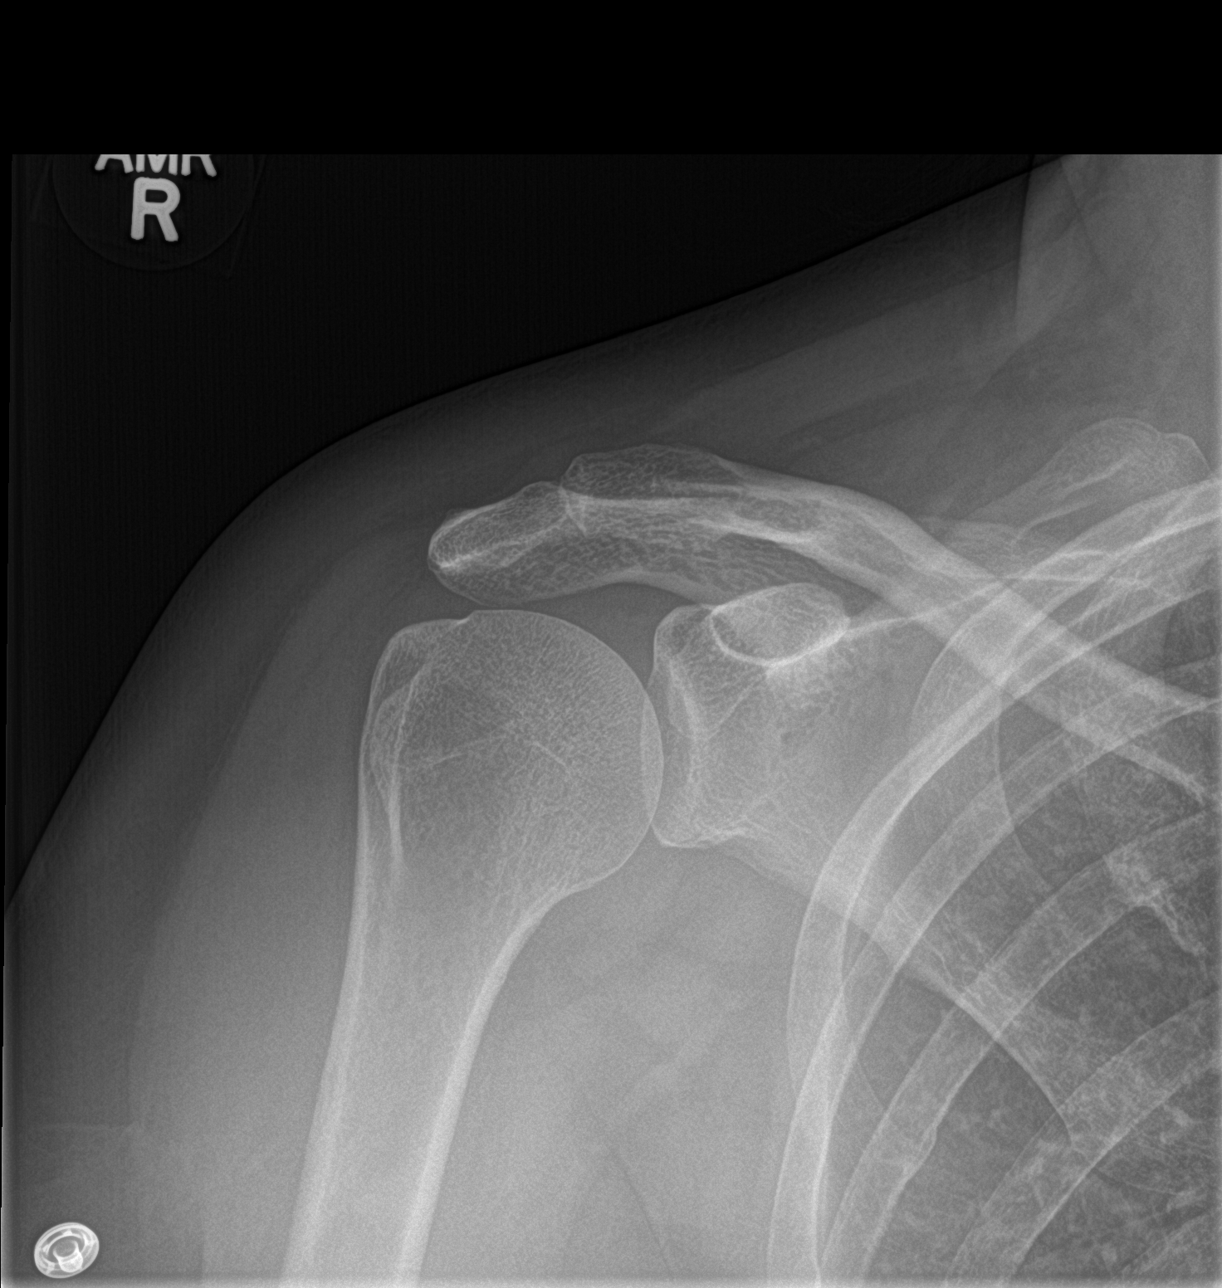

[shoulder y view]
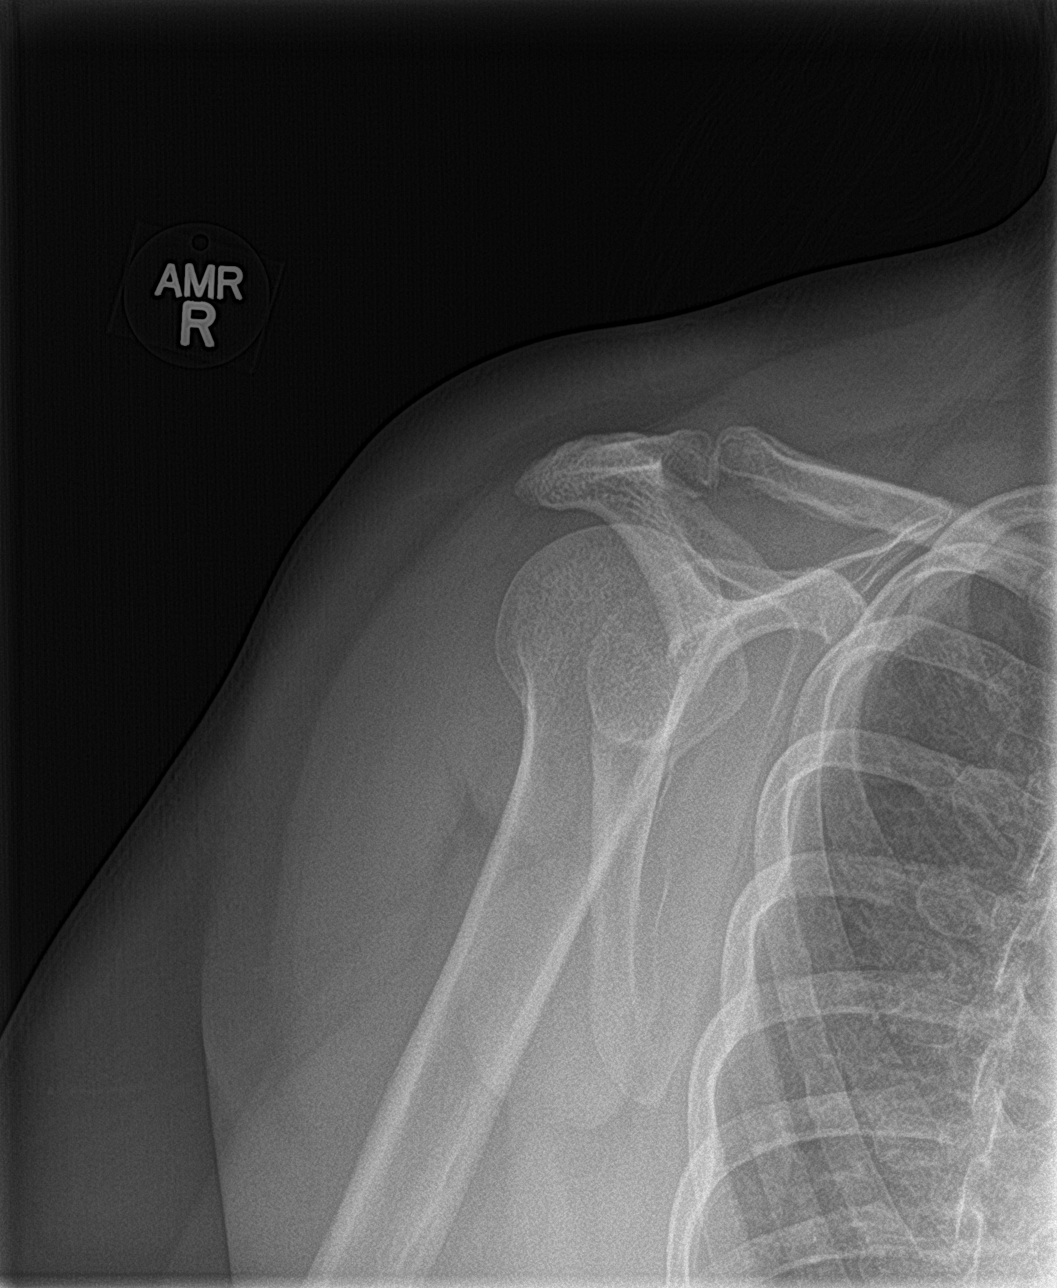

[shoulder axillary]
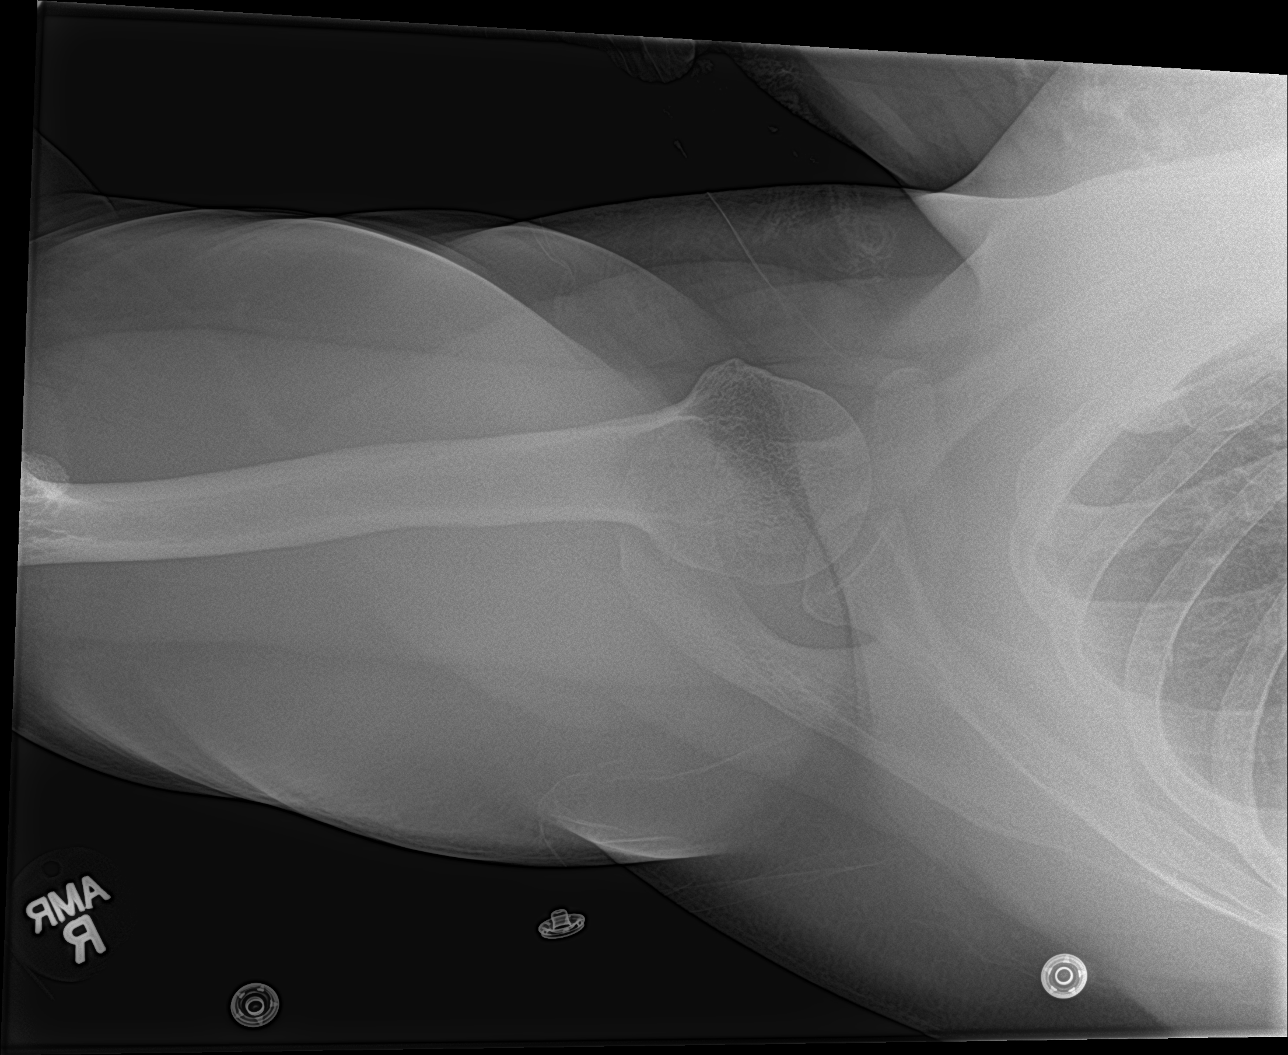

[3 of 3 positions shown; findings below may reference images not displayed]

FINDINGS: Three views of the right shoulder submitted. No acute fracture or
subluxation. No radiopaque foreign body.
IMPRESSION: Negative.

## 2017-02-14 IMAGING — DX DG KNEE COMPLETE 4+V*R*
4 series · 4 of 4 positions shown · non-contrast
Comparison: None.

CLINICAL DATA: Fell down 10 stairs today.  Anterior knee pain

EXAM:
RIGHT KNEE - COMPLETE 4+ VIEW

[knee ap (1 of 3)]
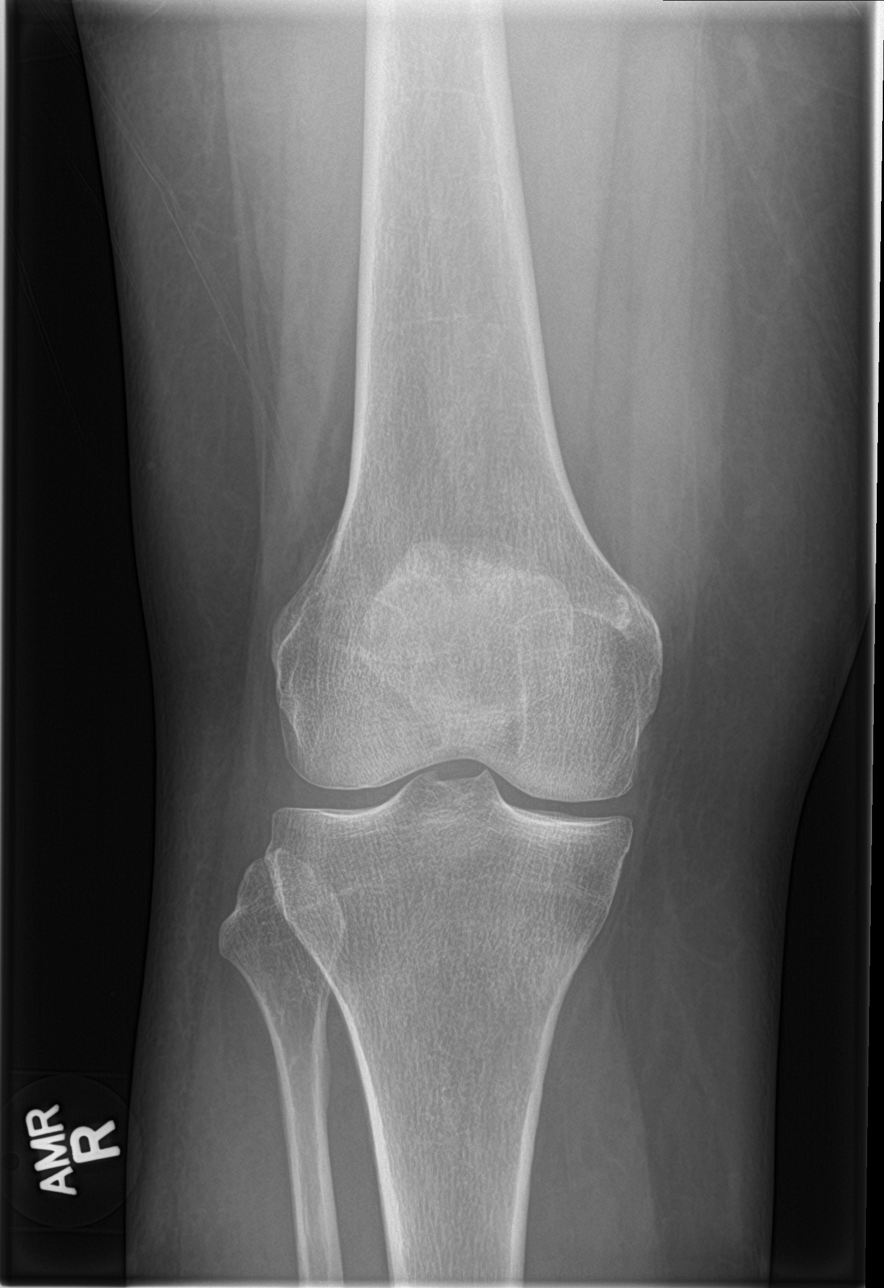

[knee lat]
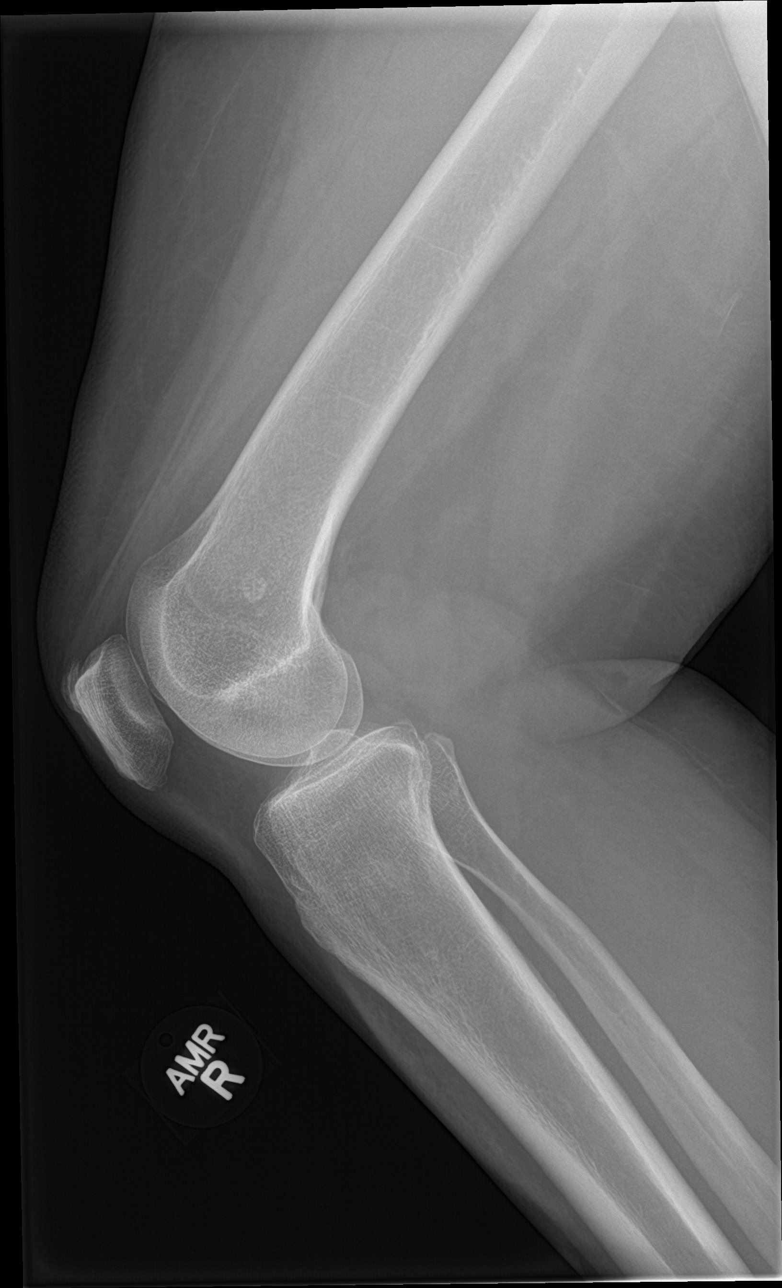

[knee ap (2 of 3)]
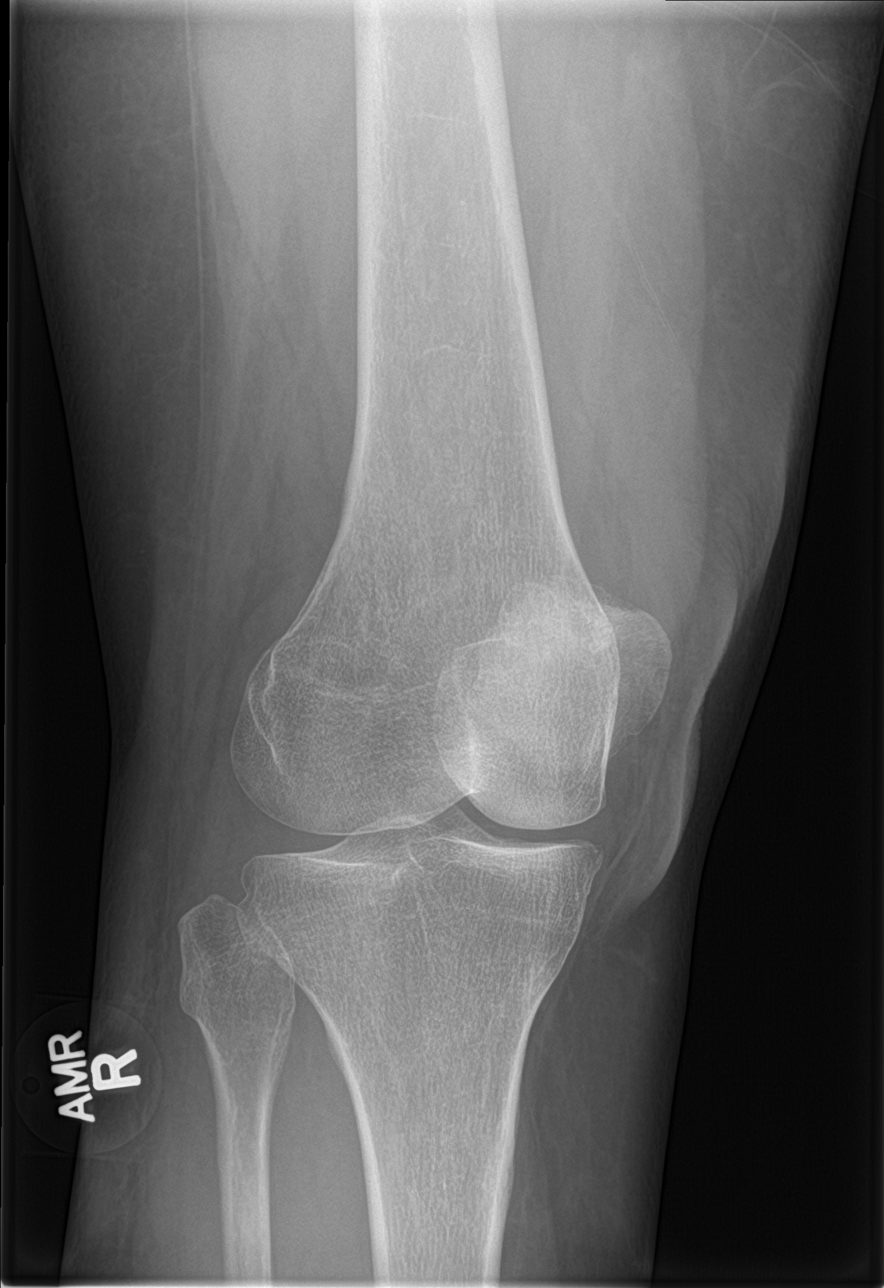

[knee ap (3 of 3)]
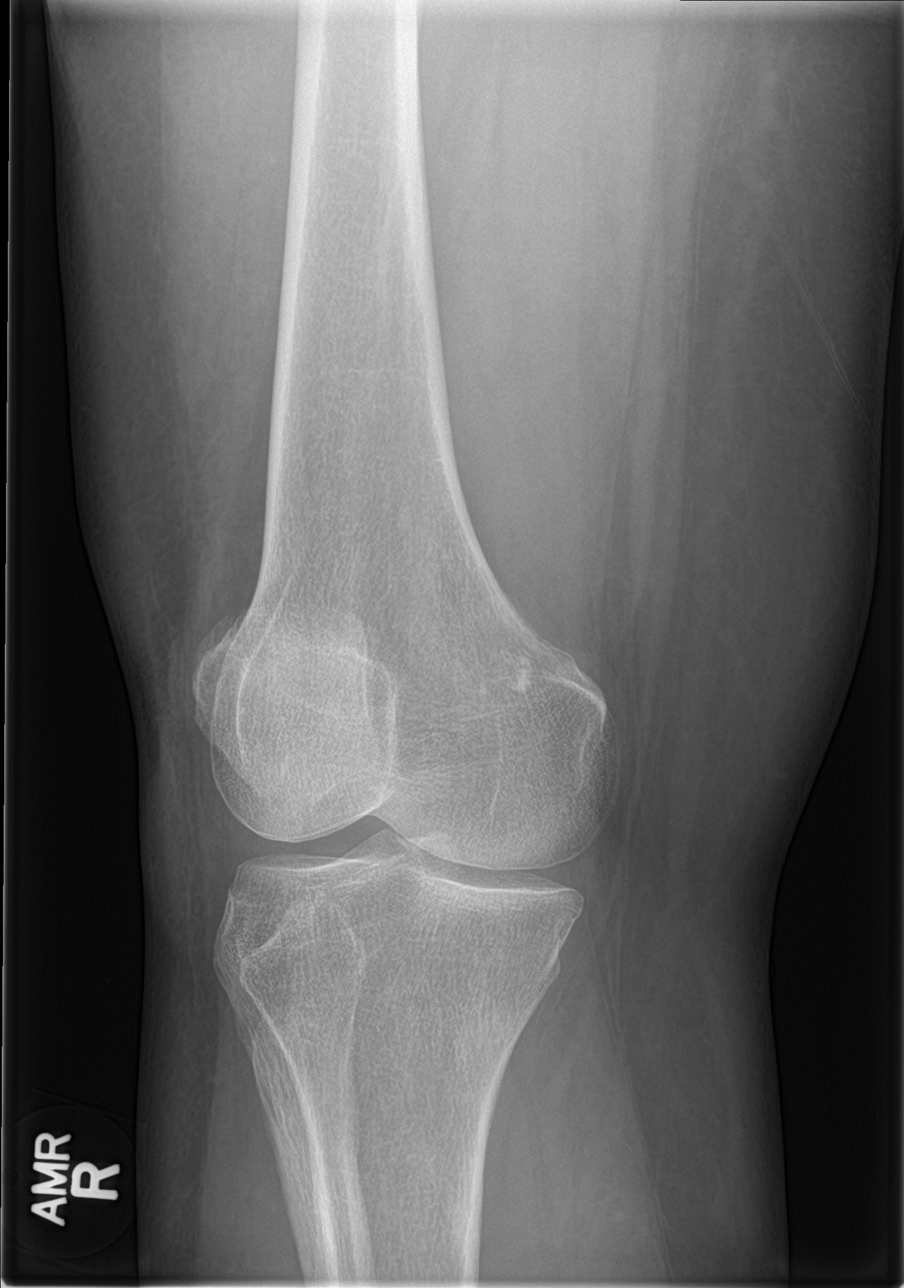

[4 of 4 positions shown; findings below may reference images not displayed]

FINDINGS: No fracture of the proximal tibia or distal femur. Patella is
normal. No joint effusion.
IMPRESSION: No fracture or dislocation.

## 2017-02-14 IMAGING — DX DG LUMBAR SPINE COMPLETE 4+V
5 series · 5 of 5 positions shown · non-contrast
Comparison: 07/18/2014

CLINICAL DATA: Fall.  Back pain

EXAM:
LUMBAR SPINE - COMPLETE 4+ VIEW

[l-spine ap]
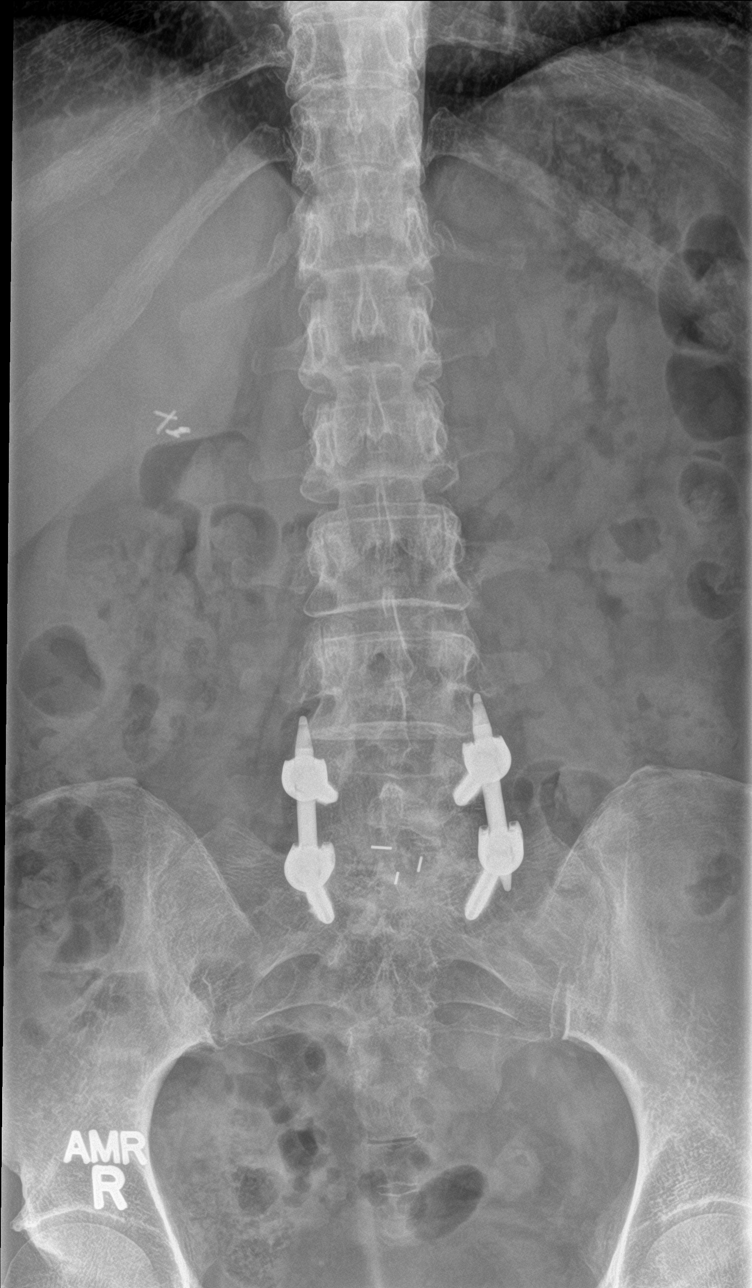

[l-spine obl (1 of 2)]
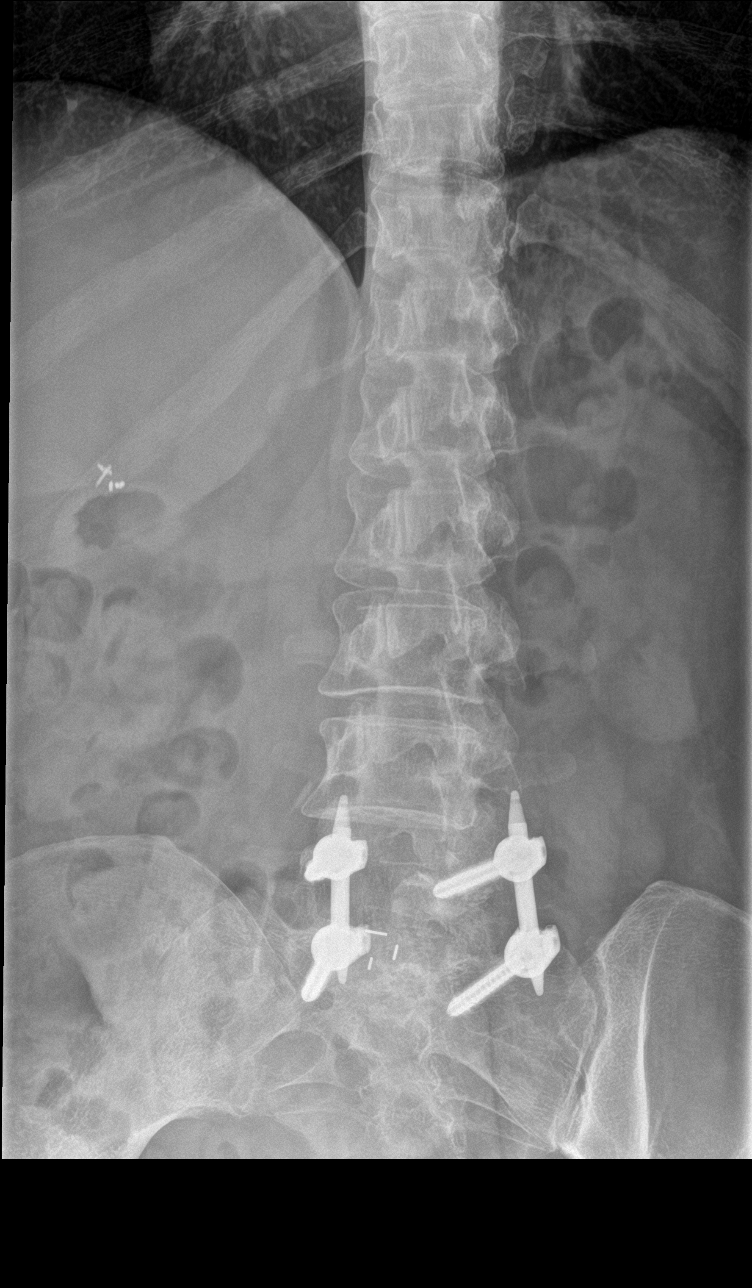

[l-spine obl (2 of 2)]
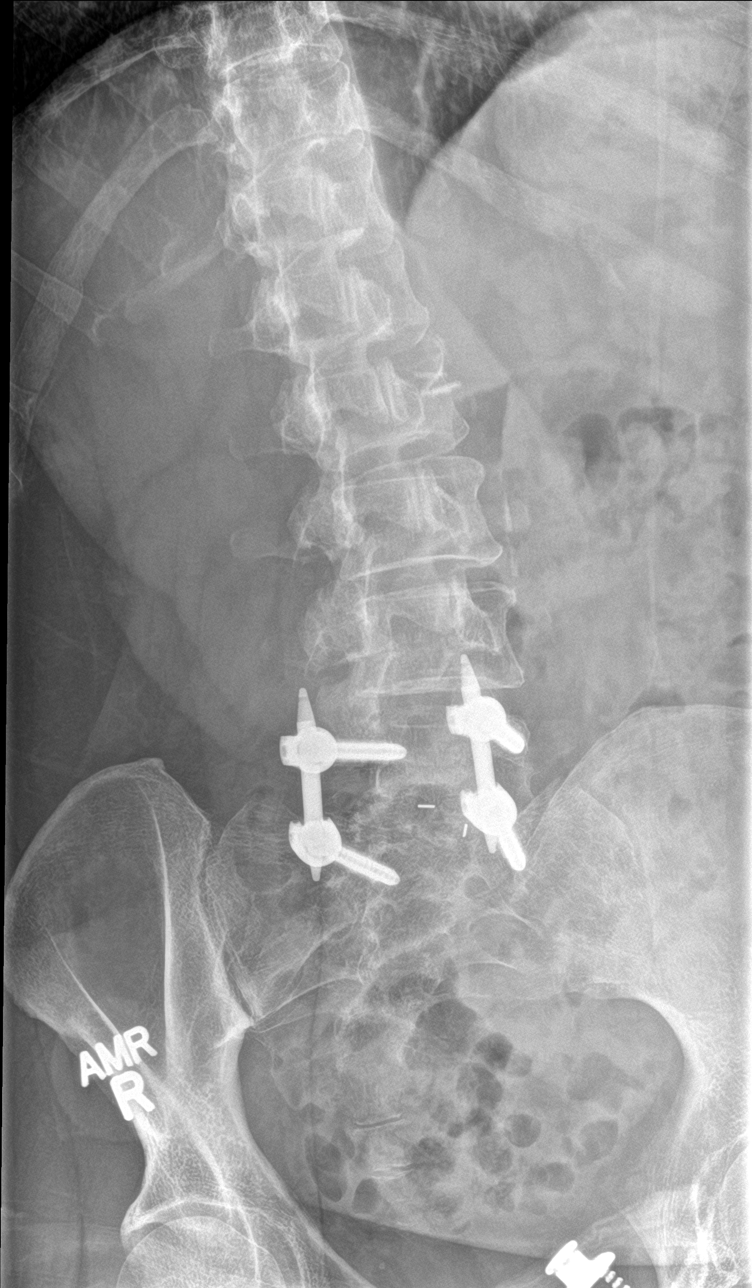

[l-spine lat]
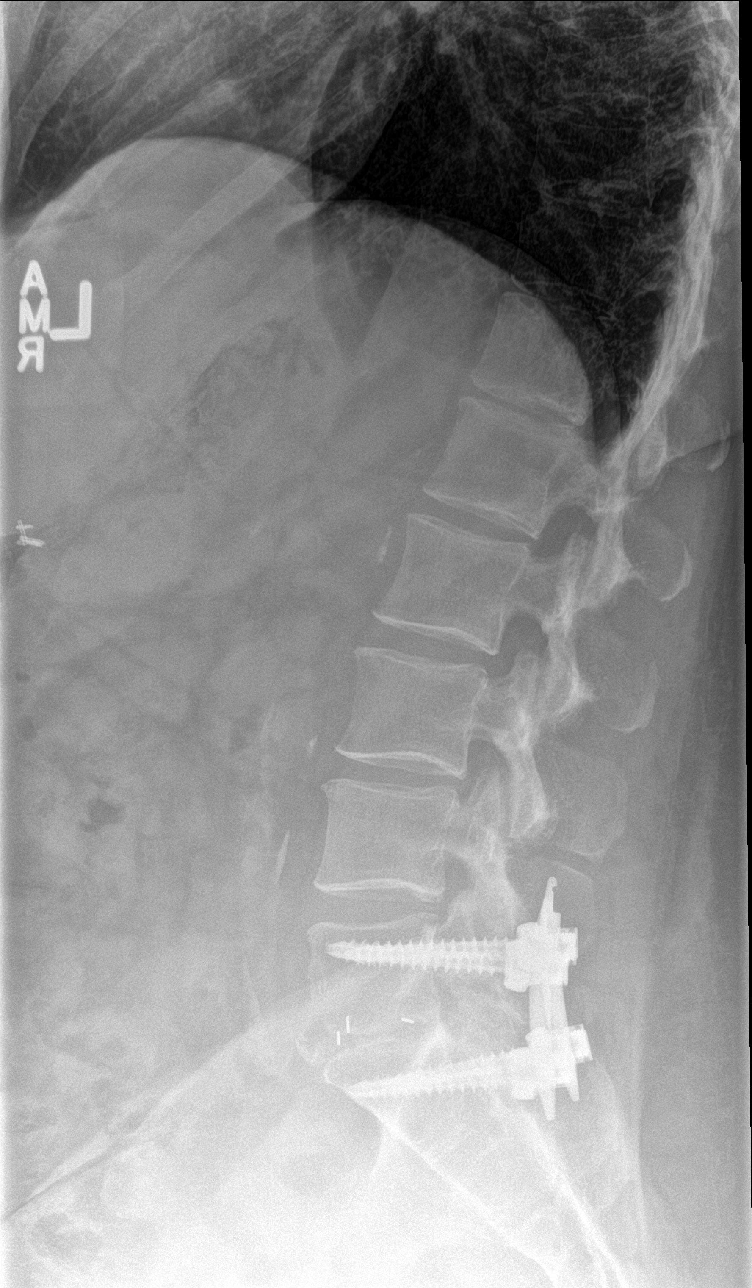

[l-spine spot]
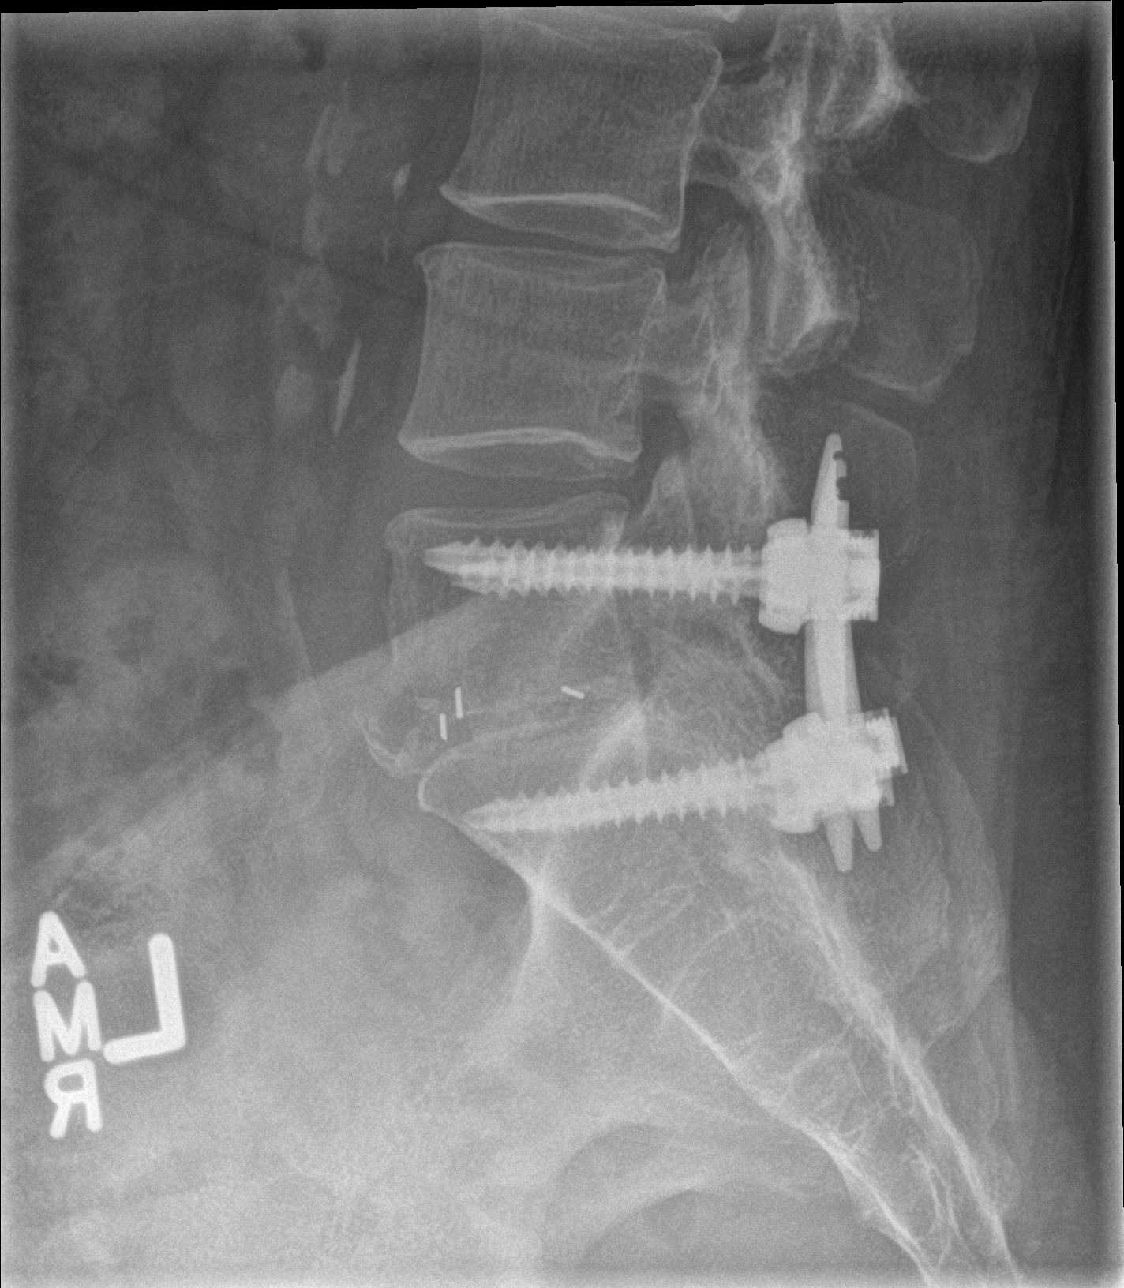

[5 of 5 positions shown; findings below may reference images not displayed]

FINDINGS: Pedicle screw and interbody fusion L5-S1 unchanged in position from
the prior study

Normal lumbar alignment. Negative for fracture. Remaining disc
spaces are normal.
IMPRESSION: Negative for fracture.

## 2017-02-14 IMAGING — DX DG FOOT COMPLETE 3+V*R*
3 series · 3 of 3 positions shown · non-contrast
Comparison: None.

CLINICAL DATA: Stop She fell down about 10 steps today. Pain
lateral side of foot. Hx of multiple fx's in the 90'x and early
[id number].

EXAM:
RIGHT FOOT COMPLETE - 3+ VIEW

[foot ap]
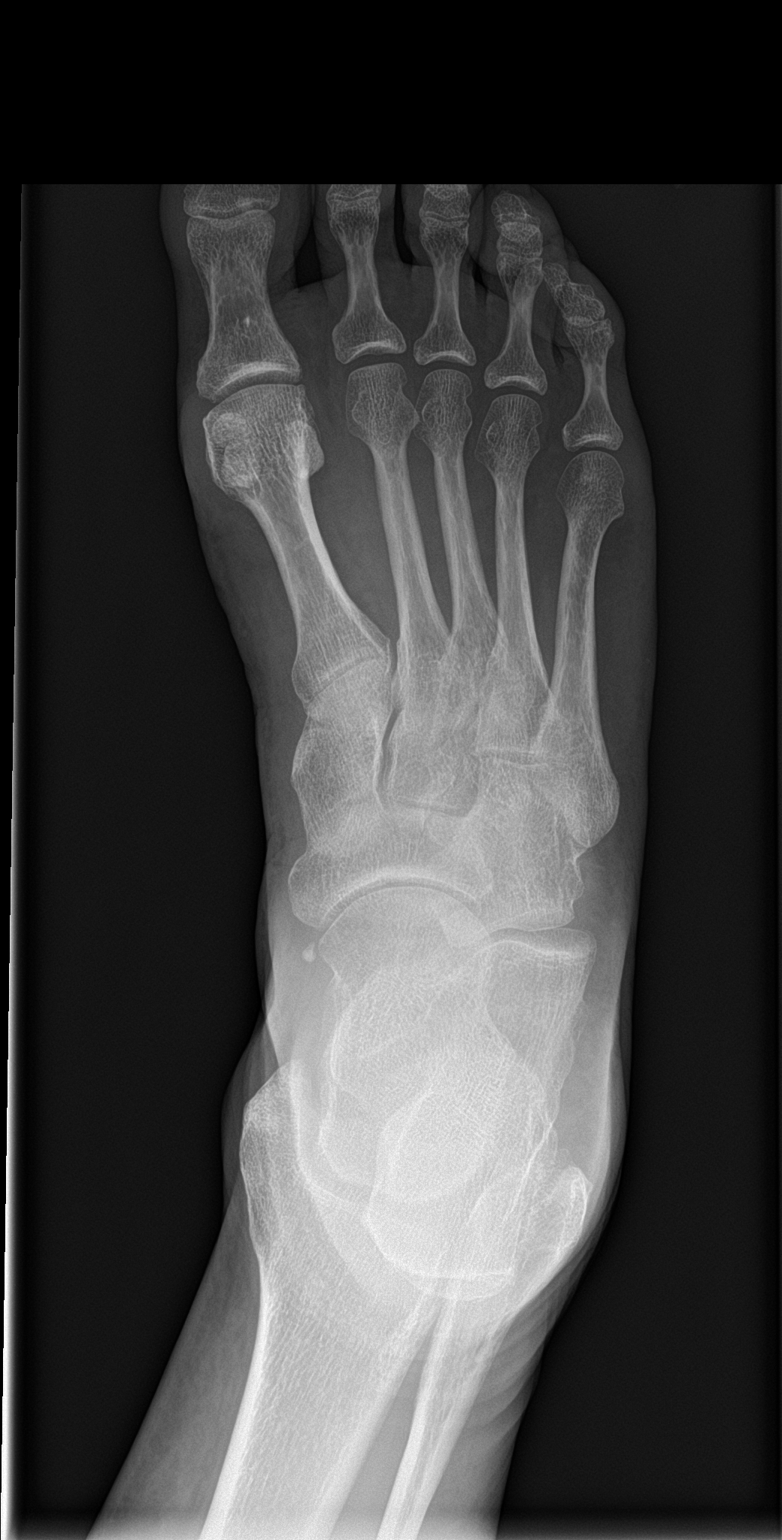

[foot obl]
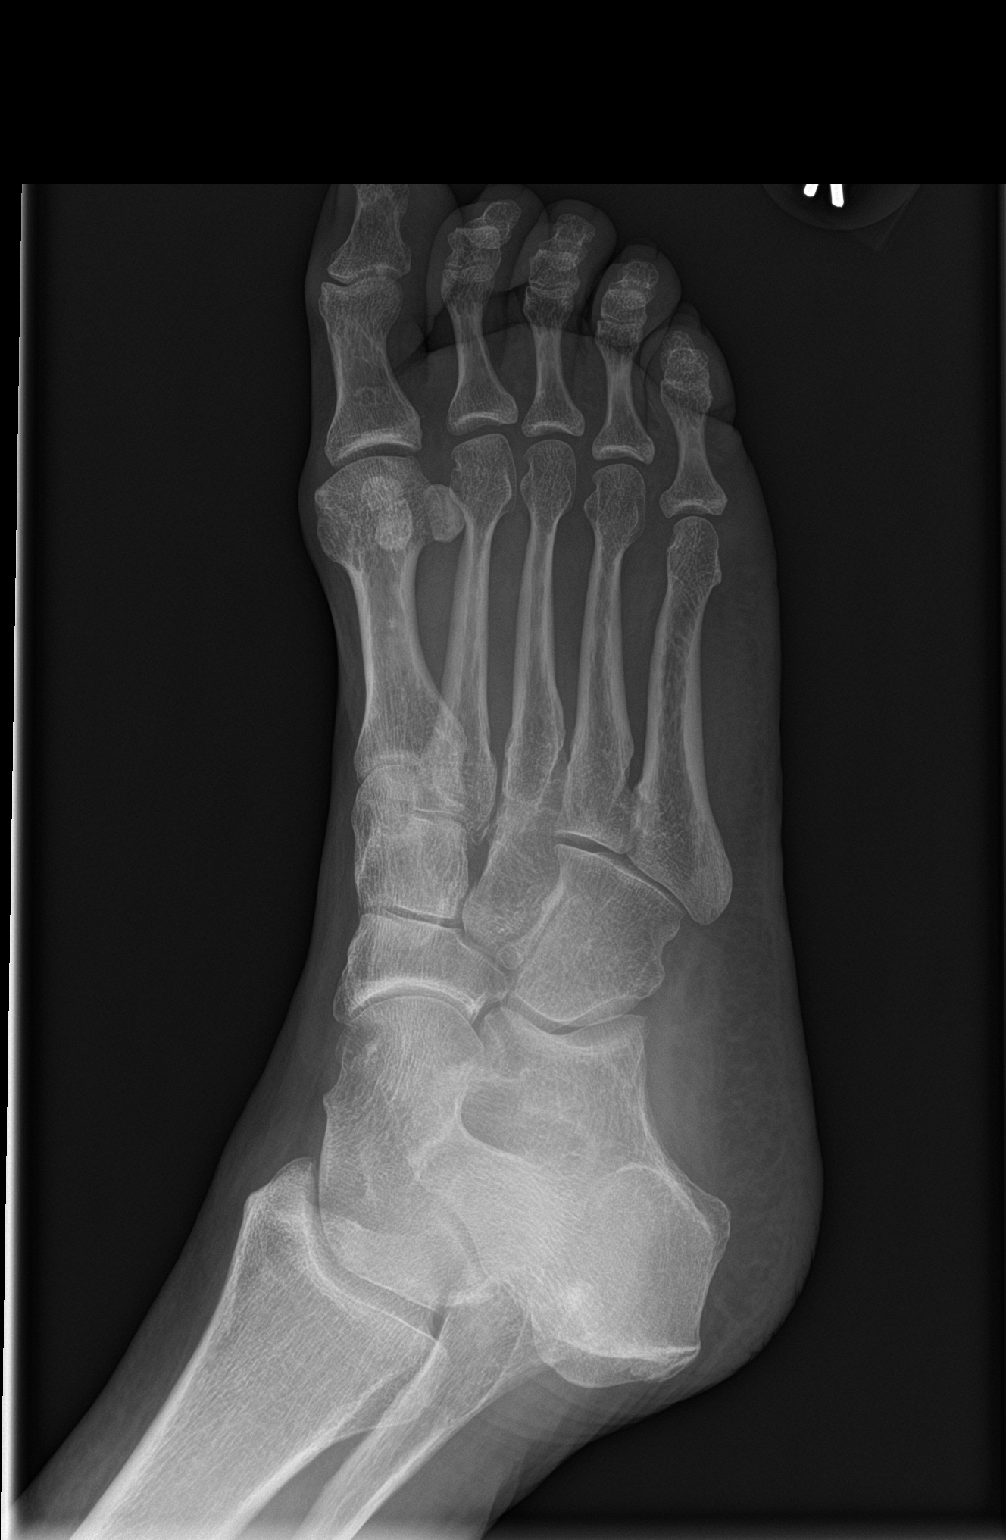

[foot lat]
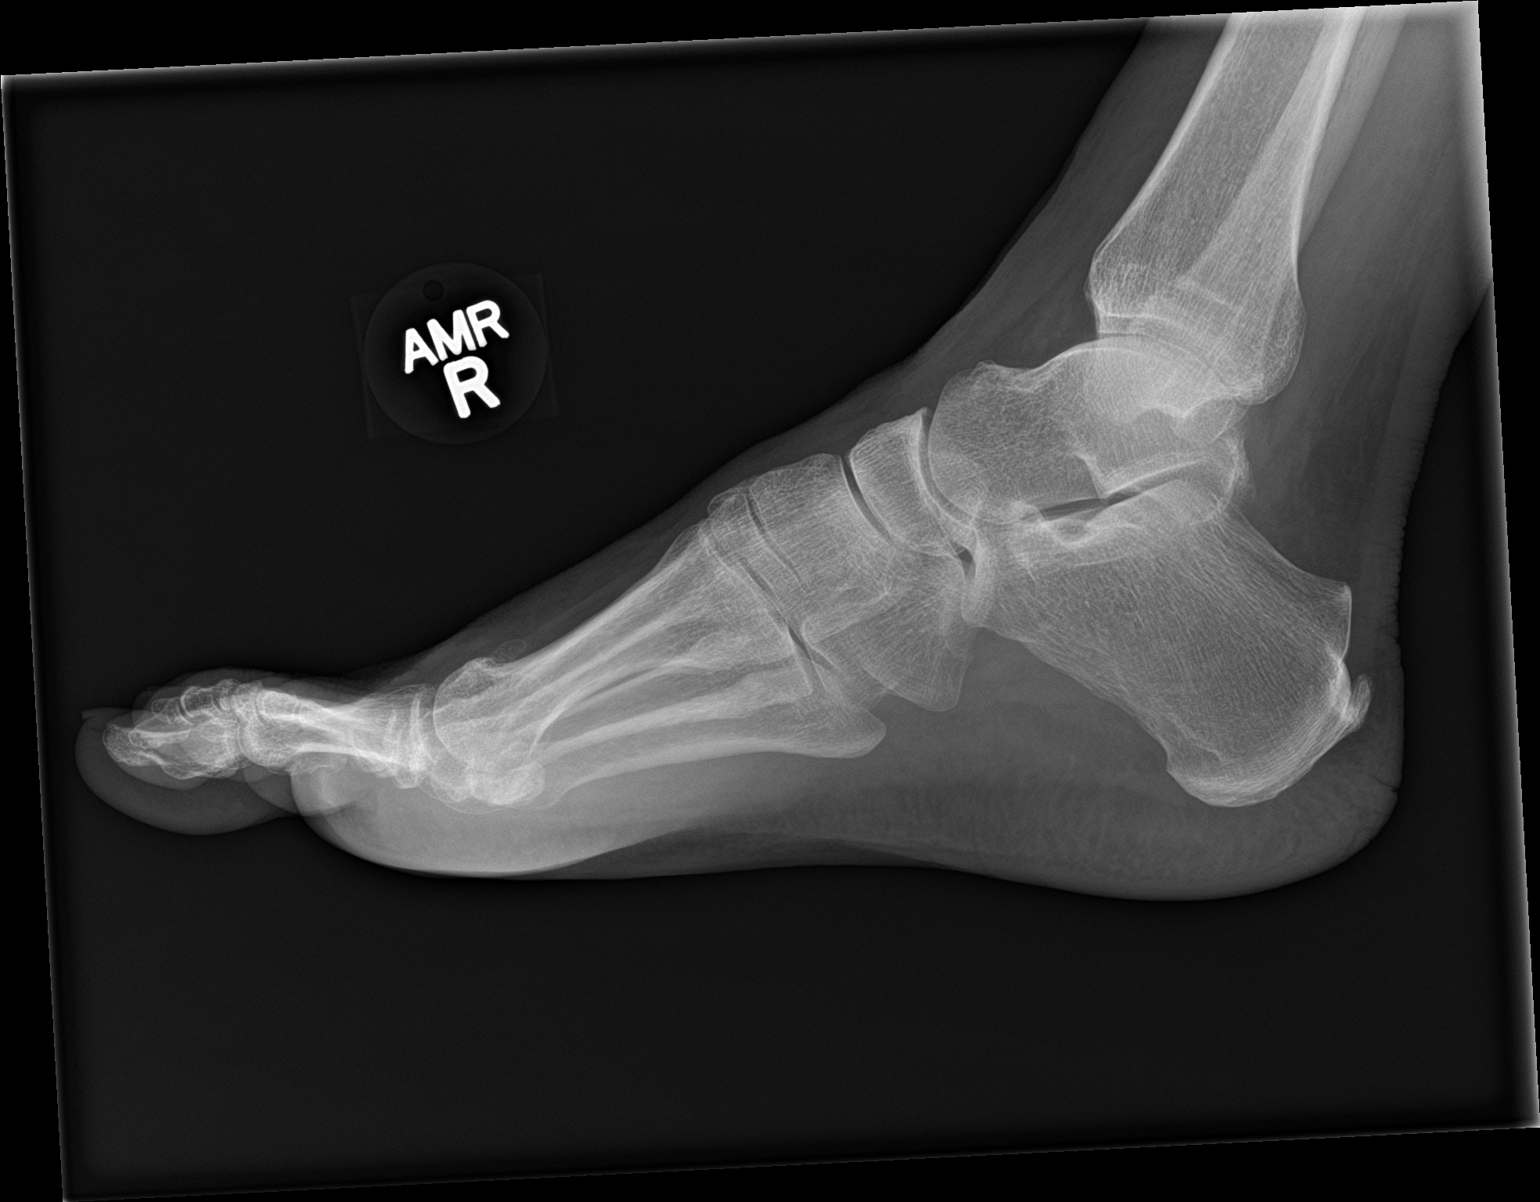

[3 of 3 positions shown; findings below may reference images not displayed]

FINDINGS: No fracture or dislocation of mid foot or forefoot. The phalanges
are normal. The calcaneus is normal. No soft tissue abnormality.
IMPRESSION: No fracture or dislocation.

## 2017-02-17 ENCOUNTER — Emergency Department (HOSPITAL_COMMUNITY)
Admission: EM | Admit: 2017-02-17 | Discharge: 2017-02-17 | Disposition: A | Discharge status: Home / Self Care | Payer: Medicare Other | Attending: Emergency Medicine | Admitting: Emergency Medicine

## 2017-02-17 ENCOUNTER — Emergency Department (HOSPITAL_COMMUNITY): Payer: Medicare Other

## 2017-02-17 ENCOUNTER — Encounter (HOSPITAL_COMMUNITY): Payer: Self-pay

## 2017-02-17 ENCOUNTER — Ambulatory Visit (HOSPITAL_COMMUNITY): Payer: Medicare Other | Attending: Orthopedic Surgery

## 2017-02-17 ENCOUNTER — Encounter (HOSPITAL_COMMUNITY): Payer: Self-pay | Admitting: *Deleted

## 2017-02-17 DIAGNOSIS — N189 Chronic kidney disease, unspecified: Secondary | ICD-10-CM | POA: Insufficient documentation

## 2017-02-17 DIAGNOSIS — W228XXA Striking against or struck by other objects, initial encounter: Secondary | ICD-10-CM | POA: Diagnosis not present

## 2017-02-17 DIAGNOSIS — Y999 Unspecified external cause status: Secondary | ICD-10-CM | POA: Insufficient documentation

## 2017-02-17 DIAGNOSIS — F17210 Nicotine dependence, cigarettes, uncomplicated: Secondary | ICD-10-CM | POA: Insufficient documentation

## 2017-02-17 DIAGNOSIS — M25572 Pain in left ankle and joints of left foot: Secondary | ICD-10-CM | POA: Insufficient documentation

## 2017-02-17 DIAGNOSIS — M25611 Stiffness of right shoulder, not elsewhere classified: Secondary | ICD-10-CM

## 2017-02-17 DIAGNOSIS — I2510 Atherosclerotic heart disease of native coronary artery without angina pectoris: Secondary | ICD-10-CM | POA: Diagnosis not present

## 2017-02-17 DIAGNOSIS — Z7902 Long term (current) use of antithrombotics/antiplatelets: Secondary | ICD-10-CM | POA: Insufficient documentation

## 2017-02-17 DIAGNOSIS — M79672 Pain in left foot: Secondary | ICD-10-CM | POA: Diagnosis not present

## 2017-02-17 DIAGNOSIS — E1122 Type 2 diabetes mellitus with diabetic chronic kidney disease: Secondary | ICD-10-CM | POA: Insufficient documentation

## 2017-02-17 DIAGNOSIS — Y929 Unspecified place or not applicable: Secondary | ICD-10-CM | POA: Insufficient documentation

## 2017-02-17 DIAGNOSIS — Z79899 Other long term (current) drug therapy: Secondary | ICD-10-CM | POA: Diagnosis not present

## 2017-02-17 DIAGNOSIS — J449 Chronic obstructive pulmonary disease, unspecified: Secondary | ICD-10-CM | POA: Insufficient documentation

## 2017-02-17 DIAGNOSIS — R29898 Other symptoms and signs involving the musculoskeletal system: Secondary | ICD-10-CM

## 2017-02-17 DIAGNOSIS — Y939 Activity, unspecified: Secondary | ICD-10-CM | POA: Insufficient documentation

## 2017-02-17 DIAGNOSIS — I252 Old myocardial infarction: Secondary | ICD-10-CM | POA: Diagnosis not present

## 2017-02-17 DIAGNOSIS — M25511 Pain in right shoulder: Secondary | ICD-10-CM | POA: Diagnosis present

## 2017-02-17 DIAGNOSIS — I129 Hypertensive chronic kidney disease with stage 1 through stage 4 chronic kidney disease, or unspecified chronic kidney disease: Secondary | ICD-10-CM | POA: Insufficient documentation

## 2017-02-17 DIAGNOSIS — Z8673 Personal history of transient ischemic attack (TIA), and cerebral infarction without residual deficits: Secondary | ICD-10-CM | POA: Insufficient documentation

## 2017-02-17 NOTE — ED Notes (Signed)
Pt alert & oriented x4, stable gait. Patient given discharge instructions, paperwork & prescription(s). Patient  instructed to stop at the registration desk to finish any additional paperwork. Patient verbalized understanding. Pt left department w/ no further questions. 

## 2017-02-17 NOTE — Therapy (Signed)
[facility] [place], [place], [address] Phone: [phone]   Fax:  [phone]  Occupational Therapy Evaluation  Patient Details  Name: Katrina [name] MRN: [id number] Date of Birth: 17-Dec-1960 Referring Provider: Dr. Tania Ade  Encounter Date: 02/17/2017      OT End of Session - 02/17/17 1333    Visit Number 1   Number of Visits 16   Date for OT Re-Evaluation 04/18/17  mini reassess: 03/17/2017   Authorization Type 1) Medicare   Authorization Time Period before 10th visit   Authorization - Visit Number 1   Authorization - Number of Visits 10   OT Start Time 1120   OT Stop Time 1215   OT Time Calculation (min) 55 min   Activity Tolerance Patient tolerated treatment well   Behavior During Therapy [facility] for tasks assessed/performed      Past Medical History:  Diagnosis Date  . Allergy   . Anemia 1975-1976   . Anginal pain ([place])   . Anxiety    takes Valium daily as needed  . Arthritis    "spine" (12/03/2016)  . Asthma    has inhalers but doesn't use (12/03/2016)  . CAD (coronary artery disease)    Moderate nonobstructive 2012-2013, [place]; No angiographic evidence of CAD 01/31/15 [facility]  . Chronic bronchitis ([place])    "get it a couple times q yr" (12/03/2016)  . Chronic kidney disease   . Chronic lower back pain    budlging disc   . Claustrophobia   . COPD (chronic obstructive pulmonary disease) ([place])   . Daily headache   . Depression   . Diverticulitis   . Family history of adverse reaction to anesthesia    2 daughters gets extremely sick   . GERD (gastroesophageal reflux disease)    takes Dexilant daily  . Heart murmur   . History of blood transfusion 1975-1976 "several"   no abnormal reaction noted  . History of colitis   . History of colon polyps    benign  . History of gastric ulcer   . History of hiatal hernia    "small one"  . History of MRSA infection 2017  . Hyperlipidemia    was on meds  but has been off over a yr  . Hypertension    takes Amlodipine and Maxzide daily  . Insomnia    takes Ambien nightly  . Iron deficiency anemia    "when I was young"  . Lung nodules   . Microscopic hematuria   . Migraine    "2-3/wk" (12/03/2016)  . MS (multiple sclerosis) ([place])    questionable per pt  . Myocardial infarction ([place]) 2000   "mild one"  . Osteoporosis   . Peripheral neuropathy    weakness,numbness,and tingling. Takes Gabapentin daily  . Pneumonia 06/2015  . PTSD (post-traumatic stress disorder) dx'd 2016/2017   "was dx'd w/bipolar in 1991; replaced w/PTSD dx 2016/2017" (12/03/2016)  . Restless leg syndrome    takes Requip daily  . 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 Type II diabetes mellitus ([place])    "went off insulin in 2012/2013" (12/03/2016)    Past Surgical History:  Procedure Laterality Date  . ABDOMINAL AORTOGRAM N/A 12/03/2016   Procedure: Abdominal Aortogram;  Surgeon: Angelia Mould, MD;  Location: [facility];  Service: Cardiovascular;  Laterality: N/A;  . ABDOMINAL HYSTERECTOMY  12/1987  .  ADENOIDECTOMY  1975  . ANKLE SURGERY Bilateral 1993; 1995 X2   "stabilzation done; 1 on the right; 2 on the left"  . APPENDECTOMY  1989  . BACK SURGERY    . BREAST LUMPECTOMY Left    "benign tumor"  . CARDIAC CATHETERIZATION N/A 01/31/2015   Procedure: Left Heart Cath and Coronary Angiography;  Surgeon: Burnell Blanks, MD;  Location: [facility];  Service: Cardiovascular;  Laterality: N/A;  . CARDIAC CATHETERIZATION  2009; 2011  . COLONOSCOPY    . DILATION AND CURETTAGE OF UTERUS    . ESOPHAGOGASTRODUODENOSCOPY    . LAPAROSCOPIC CHOLECYSTECTOMY  1996  . NASAL RECONSTRUCTION  1976  . NASAL SINUS SURGERY  1975  . POSTERIOR LUMBAR FUSION  2005  . RADIOLOGY WITH ANESTHESIA N/A 01/15/2016   Procedure: MRI LUMBAR SPINE WITHOUT;  Surgeon: Medication  Radiologist, MD;  Location: [facility];  Service: Radiology;  Laterality: N/A;  . RADIOLOGY WITH ANESTHESIA N/A 06/29/2016   Procedure: MRI OF THE BRAIN WITH AND WITHOUT;  Surgeon: Medication Radiologist, MD;  Location: [facility];  Service: Radiology;  Laterality: N/A;  . RENAL ANGIOGRAPHY N/A 12/03/2016   Procedure: Renal Angiography;  Surgeon: Angelia Mould, MD;  Location: [facility];  Service: Cardiovascular;  Laterality: N/A;  . TONSILLECTOMY  1992  . TUBAL LIGATION    . TUMOR EXCISION  1998   from back of skull; developed;  MRSA from the area that had to be packed    There were no vitals filed for this visit.      Subjective Assessment - 02/17/17 1123    Subjective  S: I had surgery 2 weeks ago.   Pertinent History Patient is a 56 y/o female S/P Right shoulder arthroscopy, debridement, SAD, DCE which was performed on 01/31/17. Pt is to wear her sling in public only and follow no protocol. Dr. Tamera Punt has referred patient to occupational therapy for evaluation and treatment.    Special Tests 32/100 FOTO score   Patient Stated Goals To use her arm as dominant   Currently in Pain? Yes   Pain Score 4    Pain Location Shoulder   Pain Orientation Right   Pain Descriptors / Indicators Burning;Aching   Pain Type Acute pain   Pain Radiating Towards N/A   Pain Onset 1 to 4 weeks ago   Pain Frequency Constant   Aggravating Factors  use and movement    Pain Relieving Factors Nothing   Effect of Pain on Daily Activities Unable to use arm for any daily tasks at this time.   Multiple Pain Sites No           [facility] OT Assessment - 02/17/17 1128      Assessment   Diagnosis right shoulder arthroscopy, debridement, SAD, DCE   Referring Provider Dr. Tania Ade   Onset Date 01/31/17  surgery   Assessment --  October    Prior Therapy None     Precautions   Precautions Shoulder   Type of Shoulder Precautions Progress as tolerated.    Required Braces or Orthoses Sling  out  in public     Restrictions   Weight Bearing Restrictions Yes     Balance Screen   Has the patient fallen in the past 6 months Yes   How many times? 1     [place] expects to be discharged to: Private residence   Additional Comments Lives in [place] connected to daughter's house  Prior Function   Level of Independence Independent   Vocation On disability     ADL   ADL comments Unable to complete any daily tasks RUE.      Mobility   Mobility Status Independent     Written Expression   Dominant Hand Right     Vision - History   Baseline Vision Wears glasses all the time     Cognition   Overall Cognitive Status Within Functional Limits for tasks assessed     ROM / Strength   AROM / PROM / Strength AROM;PROM;Strength     Palpation   Palpation comment max fascial restrictions in right upper arm and trapezius region. Increased swelling noted around shoulder.      AROM   Overall AROM Comments Assessed seated. IR/er adducted.    AROM Assessment Site Shoulder   Right/Left Shoulder Right   Right Shoulder Flexion 60 Degrees   Right Shoulder ABduction 30 Degrees   Right Shoulder Internal Rotation 90 Degrees   Right Shoulder External Rotation 50 Degrees     PROM   Overall PROM Comments Assessed supine. IR/er adducted.   PROM Assessment Site Shoulder   Right/Left Shoulder Right   Right Shoulder Flexion 76 Degrees   Right Shoulder ABduction 50 Degrees   Right Shoulder Internal Rotation 90 Degrees   Right Shoulder External Rotation 40 Degrees     Strength   Overall Strength Unable to assess;Due to precautions   Strength Assessment Site Shoulder   Right/Left Shoulder Right                  OT Treatments/Exercises (OP) - 02/17/17 1332      Modalities   Modalities Electrical Stimulation     Electrical Stimulation   Electrical Stimulation Location right shoulder   Electrical Stimulation Action interferential   Electrical  Stimulation Parameters 7.2 CV   Electrical Stimulation Goals Pain;Edema               OT Education - 02/17/17 1332    Education provided Yes   Education Details table slides and seated scapular A/ROM exercises   Person(s) Educated Patient   Methods Explanation;Demonstration;Verbal cues;Handout   Comprehension Returned demonstration;Verbalized understanding          OT Short Term Goals - 02/17/17 1344      OT SHORT TERM GOAL #1   Title Patient will be educated and independent with HEP to increase functional performance during daily tasks while using RUE.    Time 4   Period Weeks   Status New   Target Date 03/17/17     OT SHORT TERM GOAL #2   Title Patient will increase P/ROM to [facility] to increase ability to complete dressing tasks with less difficulty.    Time 4   Period Weeks   Status New     OT SHORT TERM GOAL #3   Title Patient will increase RUE strength to 3/5 to increase ability to complete activities at waist level or above with more comfort.    Time 4   Period Weeks   Status New     OT SHORT TERM GOAL #4   Title Patient will decrease fascial restrictions to moderate amount to increase functional mobility of RUE needed during daily tasks.   Time 4   Period Weeks   Status New     OT SHORT TERM GOAL #5   Title Patient will decrease pain level to 5/10 or less during functional use of RUE.  Time 4   Period Weeks   Status New           OT Long Term Goals - 02/17/17 1349      OT LONG TERM GOAL #1   Title Patient will return to highest level of independence using RUE as dominant extremity for all tasks.    Time 8   Period Weeks   Status New   Target Date 04/14/17     OT LONG TERM GOAL #2   Title Patient will increase RUE strength to 4/5 to increase ability to complete normal household tasks with less difficulty.    Time 8   Period Weeks   Status New     OT LONG TERM GOAL #3   Title Patient will increase A/ROM of RUE to WNL to increase ability  to complete reaching tasks overhead.    Time 8   Period Weeks   Status New     OT LONG TERM GOAL #4   Title Patient will decrease pain level during functional tasks in RUE to 3/10 or less.    Time 8   Period Weeks   Status New     OT LONG TERM GOAL #5   Title Patient will decrease fascial restrictions in RUE to min amount or less to increase functional mobility needed to complete tasks.    Time 8   Period Weeks   Status New               Plan - 02/17/17 1337    Clinical Impression Statement A: Patient is a 56 y/o female S/P right shoulder arthroscopy, debridement, SAD, DCE causing increased pain, fascial restrictions and decreased strength and ROM resultingin difficulty completing any daily tasks with RUE as dominant.    Occupational Profile and client history currently impacting functional performance motiviated to return to prior level of functioning, independent prior to surgery, strong support at home   Occupational performance deficits (Please refer to evaluation for details): ADL's;IADL's;Rest and Sleep;Leisure   Rehab Potential Excellent   Current Impairments/barriers affecting progress: hx of stroke   OT Frequency 2x / week   OT Duration 8 weeks   OT Treatment/Interventions Self-care/ADL training;Cryotherapy;Electrical Stimulation;Moist Heat;Passive range of motion;DME and/or AE instruction;Therapeutic exercises;Therapeutic activities;Iontophoresis;Ultrasound;Manual Therapy;Patient/family education   Plan P: Pt will benefit from skilled OT services to increase functional performance during daily tasks while using RUE. Treatment Plan: Pt is able to progress as tolerated although she has increased swelling and pain due to a fall last thursday. Start with myofascial release, manual stretching, P/ROM, AA/ROM, A/ROM, and general strengthening. OK to use intophoresis for pain management. Modalities PRN.    Clinical Decision Making Limited treatment options, no task modification  necessary   Consulted and Agree with Plan of Care Patient      Patient will benefit from skilled therapeutic intervention in order to improve the following deficits and impairments:  Decreased strength, Decreased range of motion, Pain, Increased edema, Increased fascial restricitons, Impaired UE functional use  Visit Diagnosis: Other symptoms and signs involving the musculoskeletal system - Plan: Ot plan of care cert/re-cert  Acute pain of right shoulder - Plan: Ot plan of care cert/re-cert  Stiffness of right shoulder, not elsewhere classified - Plan: Ot plan of care cert/re-cert      G-Codes - 27/03/50 1351    Functional Assessment Tool Used (Outpatient only) FOTO score: 32/100 (68% impaired)   Functional Limitation Carrying, moving and handling objects   Carrying, Moving and Handling Objects Current  Status [phone]) At least 73 percent but less than 80 percent impaired, limited or restricted   Carrying, Moving and Handling Objects Goal Status ([id number]) At least 20 percent but less than 40 percent impaired, limited or restricted      Problem List Patient Active Problem List   Diagnosis Date Noted  . Hypertension 12/03/2016  . Adverse food reaction 10/14/2016  . Pulmonary nodules 10/14/2016  . Non-seasonal allergic rhinitis due to fungal spores 10/14/2016  . Cigarette smoker 09/24/2016  . Cerebral microvascular disease 08/19/2016  . Non compliance with medical treatment 05/11/2016  . Leg pain, anterior, right 05/11/2016  . Syncope 05/02/2016  . Type 2 diabetes mellitus with diabetic neuropathy ([place]) 05/02/2016  . Restless leg syndrome 05/02/2016  . Cerebrovascular accident (CVA) ([place])   . Vascular headache   . History of chest pain   . Generalized anxiety disorder   . Chronic bilateral low back pain without sciatica   . Peripheral neuropathy   . Migraine without status migrainosus, not intractable   . PTSD (post-traumatic stress disorder)   . History of cerebrovascular  accident (CVA) with residual deficit   . CVA (cerebral vascular accident) ([employer]) 04/03/2016  . Stroke (cerebrum) ([place]) 04/03/2016  . Post traumatic stress disorder 10/06/2015  . Precordial pain   . Migraine syndrome 01/24/2015  . Migraine 01/24/2015  . TIA (transient ischemic attack) 12/25/2014  . Hypertensive urgency   . Other chest pain   . Malignant hypertension   . Hypokalemia 04/16/2014  . Muscle weakness (generalized) 04/05/2014  . Stiffness of left hip joint 04/05/2014  . Pain in left hip 04/05/2014  . Diastolic dysfunction 29/92/4268  . Normal coronary arteries 03/20/2014  . PUD (peptic ulcer disease) 03/20/2014  . Type 2 diabetes, uncontrolled, with neuropathy ([place]) 03/20/2014  . Morbid obesity due to excess calories ([place]) 03/20/2014  . Left-sided weakness 03/20/2014  . Paresthesias 03/20/2014  . Cerebrovascular disease 03/20/2014  . Dyspnea on exertion 03/05/2012  . Tobacco abuse 03/05/2012  . Moderate COPD (chronic obstructive pulmonary disease) ([place]) 11/23/2011  . GERD (gastroesophageal reflux disease) 11/23/2011  . Uncontrolled hypertension 11/23/2011   Ailene Ravel, OTR/L,CBIS  [phone]  02/17/2017, 1:53 PM  [facility] [address] [place], [place], [address] Phone: [phone]   Fax:  [phone]  Name: Katrina [name] MRN: [id number] Date of Birth: [birth year]

## 2017-02-17 NOTE — Patient Instructions (Signed)
Complete 2-3 time a day.   SHOULDER: Flexion On Table   Place hands on table, elbows straight. Move hips away from body. Press hands down into table. Hold __2_ seconds. __10_ reps per set, ___ sets per day, ___ days per week  Abduction (Passive)   With arm out to side, resting on table, lower head toward arm, keeping trunk away from table. Hold _2___ seconds. Repeat _10___ times. Do ____ sessions per day.  Copyright  VHI. All rights reserved.     Internal Rotation (Assistive)   Seated with elbow bent at right angle and held against side, slide arm on table surface in an inward arc. Repeat _10___ times. Do ____ sessions per day. Activity: Use this motion to brush crumbs off the table.  Copyright  VHI. All rights reserved.   1) Seated Row   Sit up straight with elbows by your sides. Pull back with shoulders/elbows, keeping forearms straight, as if pulling back on the reins of a horse. Squeeze shoulder blades together. Repeat _10__times, ____sets/day    2) Shoulder Elevation    Sit up straight with arms by your sides. Slowly bring your shoulders up towards your ears. Repeat_10__times, ____ sets/day    3) Shoulder Extension    Sit up straight with both arms by your side, draw your arms back behind your waist. Keep your elbows straight. Repeat _10___times, ____sets/day.

## 2017-02-17 NOTE — ED Notes (Signed)
Pt reports left foot pain that has become worse today. Pt states turned over stand on her foot Monday. Pt says pain moving up her calf to the back of her knee. No swelling noted. A 1 cm scabbed area on top of her foot is seen from where she dropped stand on it Monday.

## 2017-02-17 NOTE — ED Triage Notes (Addendum)
Pt reports dropping something on her foot on Monday. Pt states she woke up today with pain in her left ankle and foot. Pt now stating that the pain runs up into her left calf and behind her left knee. Pt has right shoulder surgery 2 weeks ago.

## 2017-02-17 NOTE — Discharge Instructions (Signed)
X-ray show no broken bones. If pain persists follow-up with orthopedics. Ice, elevate, ankle brace, Tylenol or ibuprofen.

## 2017-02-19 IMAGING — DX DG CHEST 2V
2 series · 2 of 2 positions shown · non-contrast
Comparison: Chest radiograph dated 12/24/2014

CLINICAL DATA: 54-year-old female with chest pain

EXAM:
CHEST  2 VIEW

[chest pa]
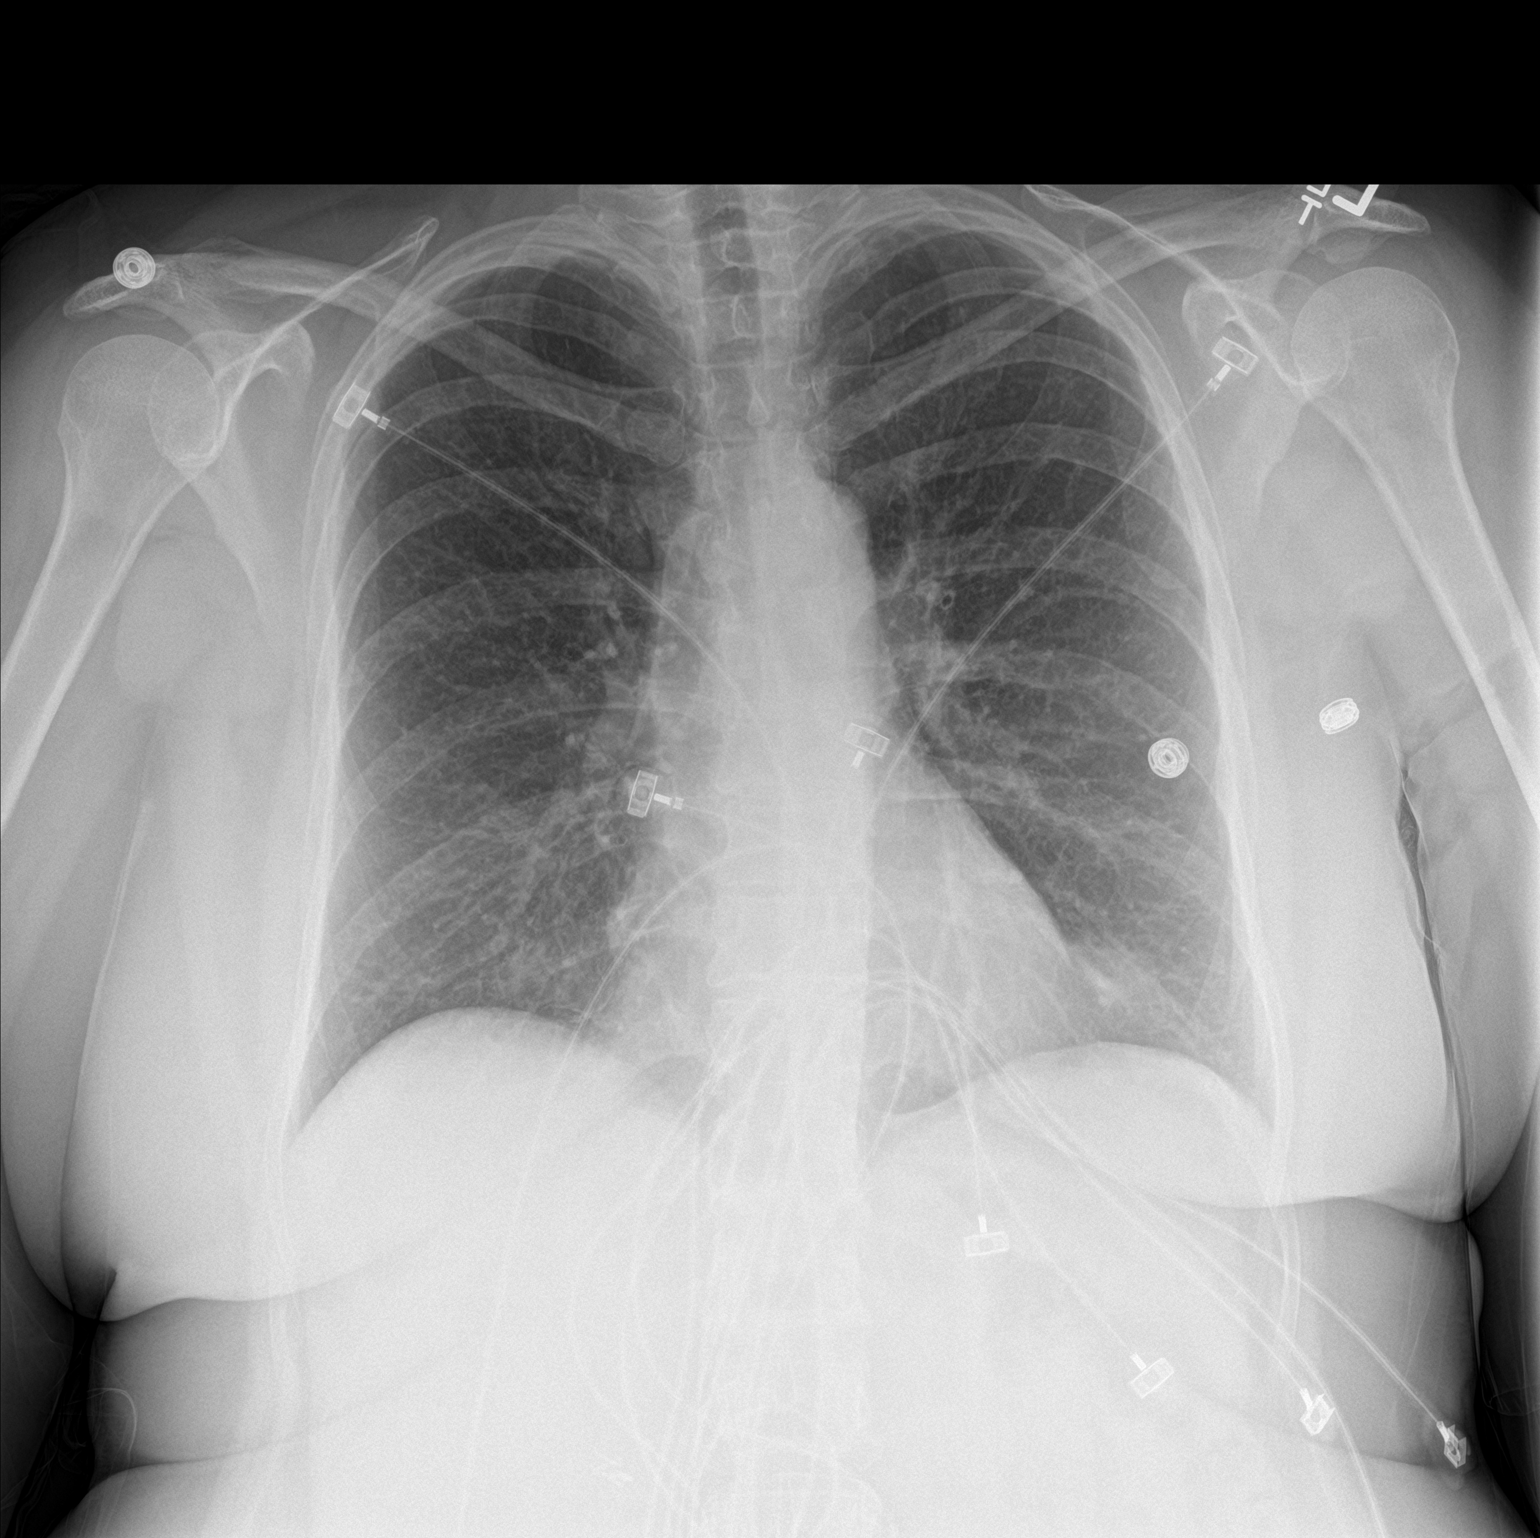

[chest lat]
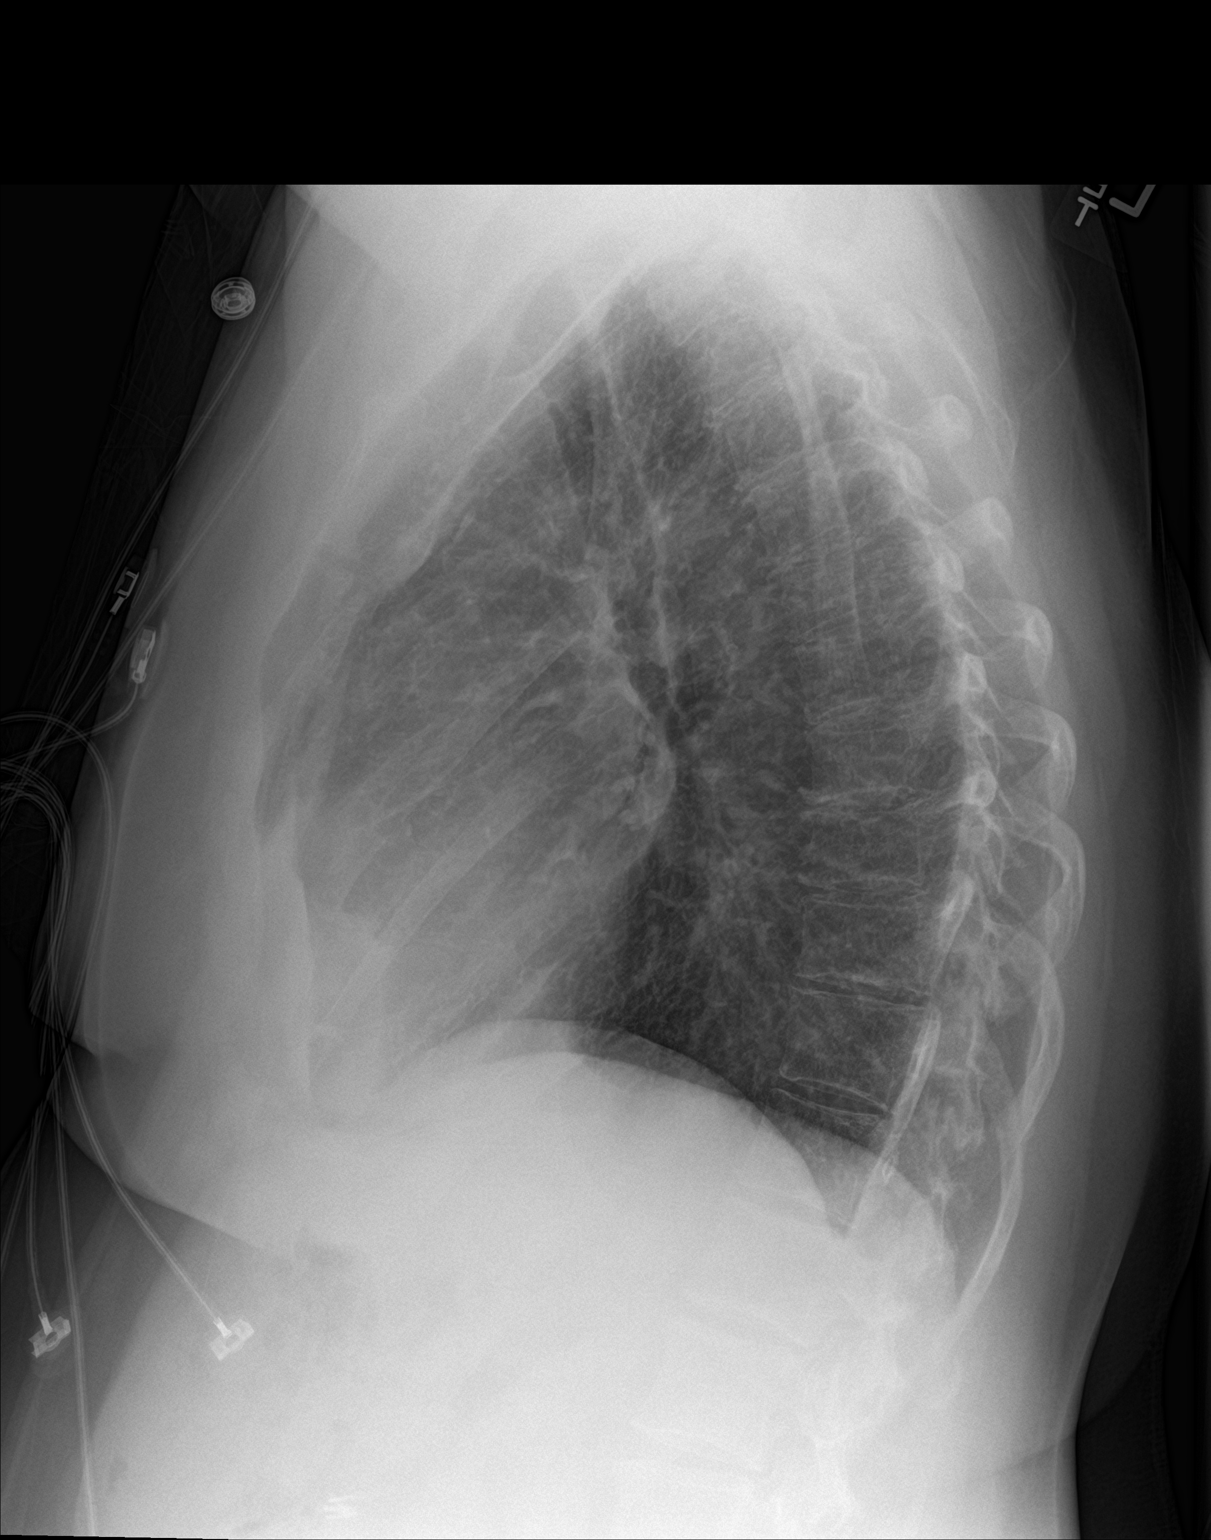

[2 of 2 positions shown; findings below may reference images not displayed]

FINDINGS: The heart size and mediastinal contours are within normal limits.
Both lungs are clear. The visualized skeletal structures are
unremarkable.
IMPRESSION: No active cardiopulmonary disease.

## 2017-02-19 IMAGING — CT CT HEAD W/O CM
1 of 2 series · 15 of 30 positions shown, 19 images · non-contrast
Comparison: CT dated 01/24/2015

CLINICAL DATA: 54-year-old female with left-sided facial numbness
x2 hours. Code stroke.

EXAM:
CT HEAD WITHOUT CONTRAST
TECHNIQUE: Contiguous axial images were obtained from the base of the skull
through the vertex without intravenous contrast.

[Series 3: headtrauma 2.4 h60s · axial · 0.43mm/px · z∈[+93,+227]mm · 15 of 60 slices shown, 19 images]
[im 4/60  brain]
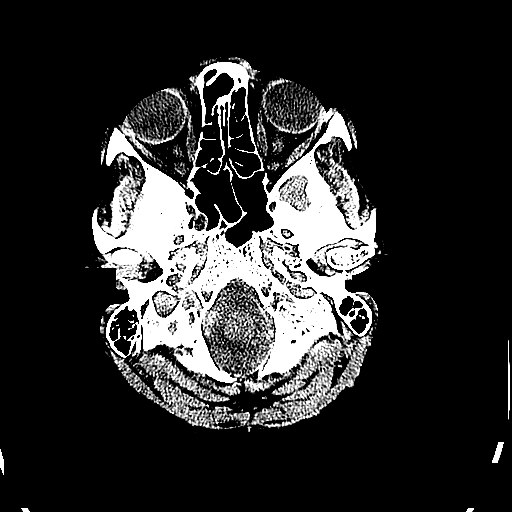
[im 4/60  bone]
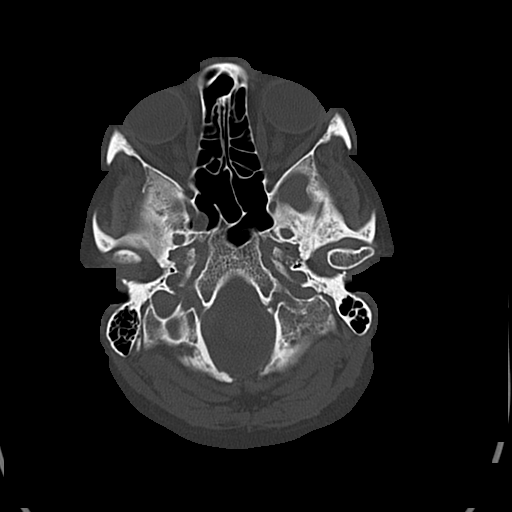
[im 7/60  brain]
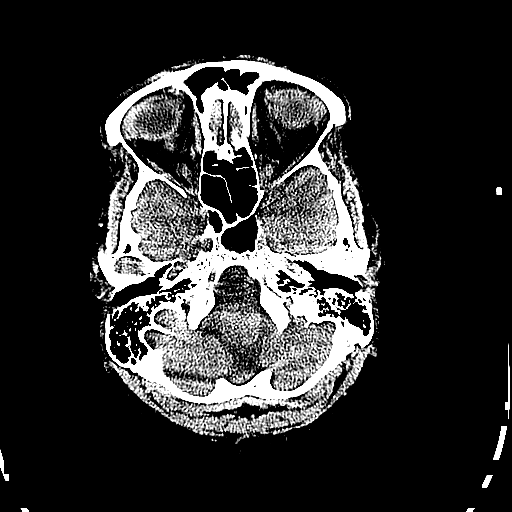
[im 13/60  brain]
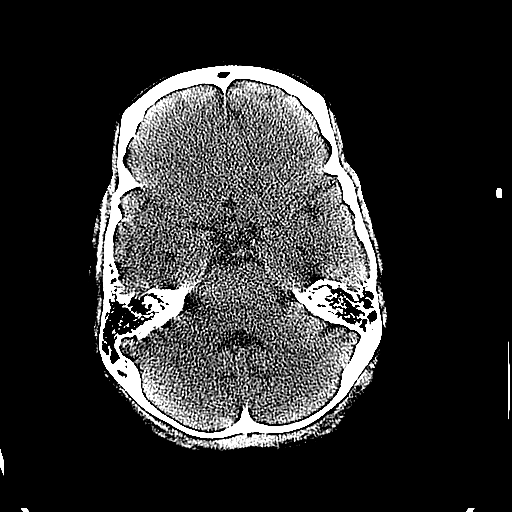
[im 16/60  brain]
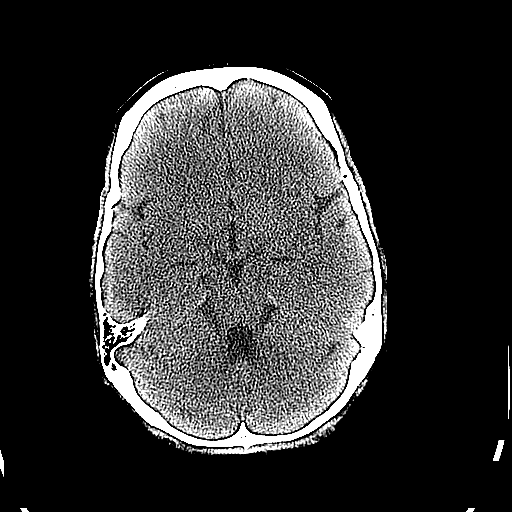
[im 19/60  brain]
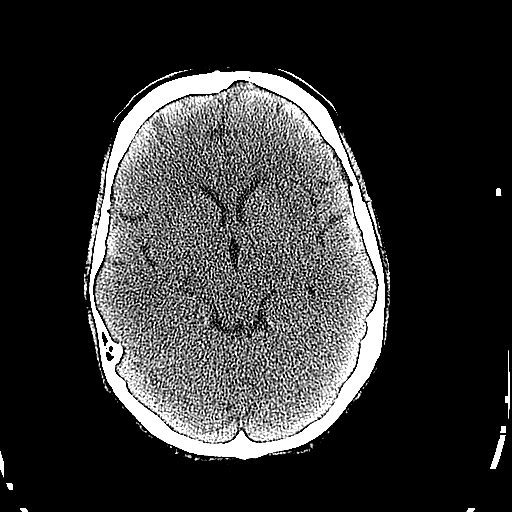
[im 19/60  bone]
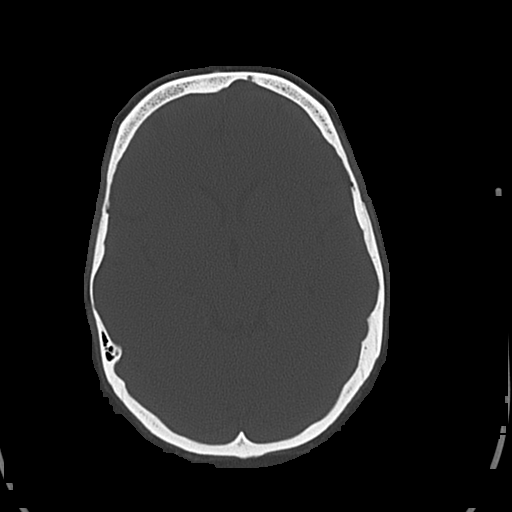
[im 22/60  brain]
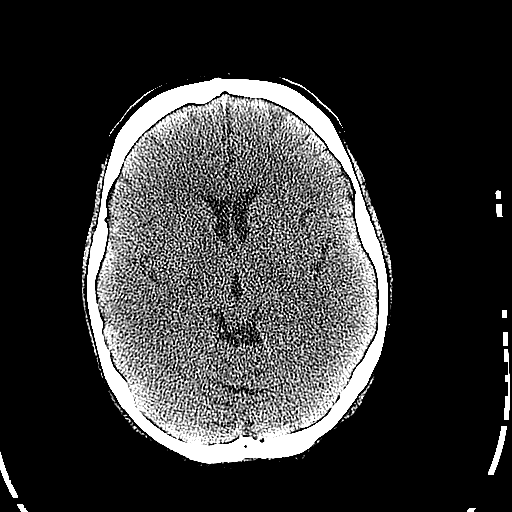
[im 25/60  brain]
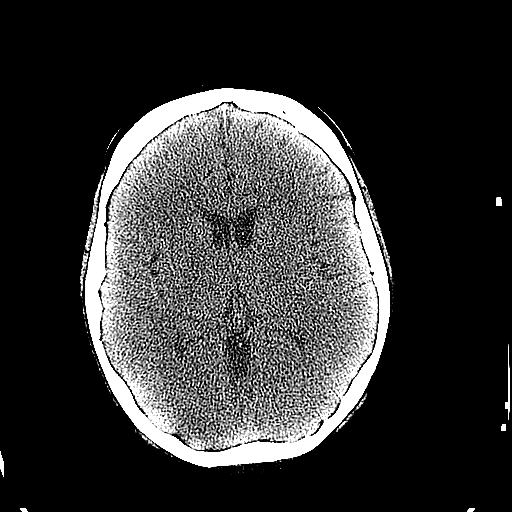
[im 32/60  brain]
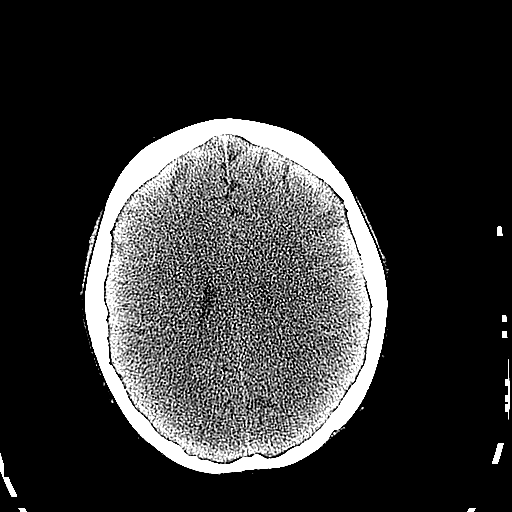
[im 35/60  brain]
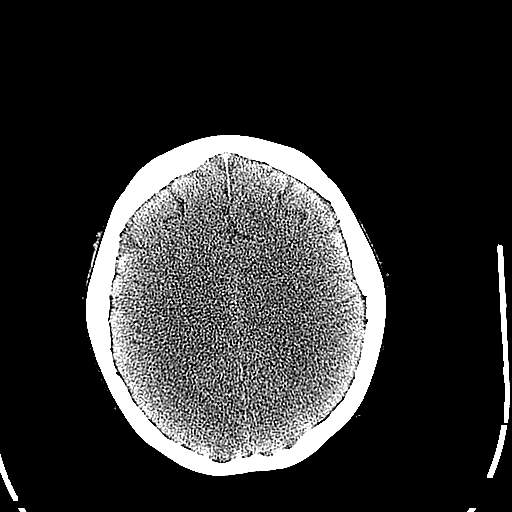
[im 35/60  bone]
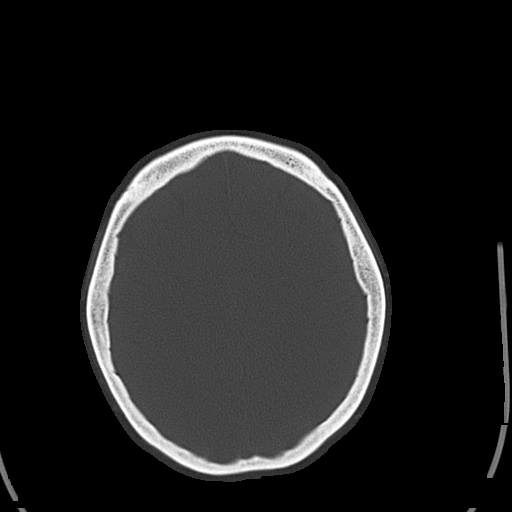
[im 38/60  brain]
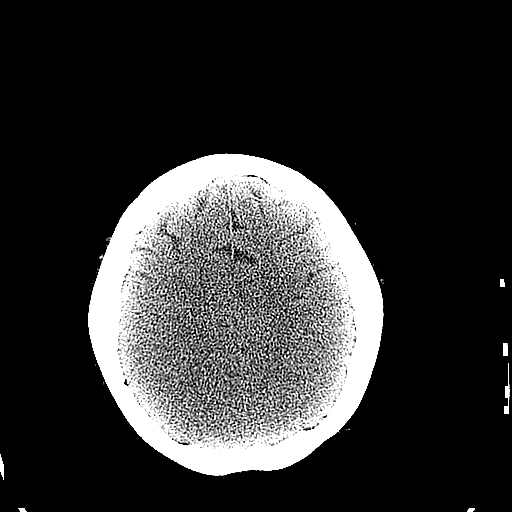
[im 41/60  brain]
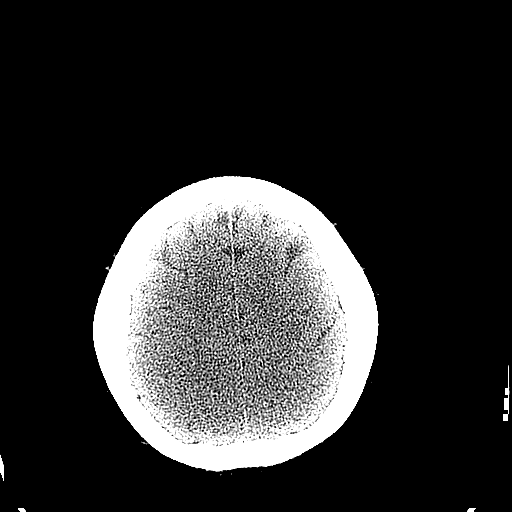
[im 44/60  brain]
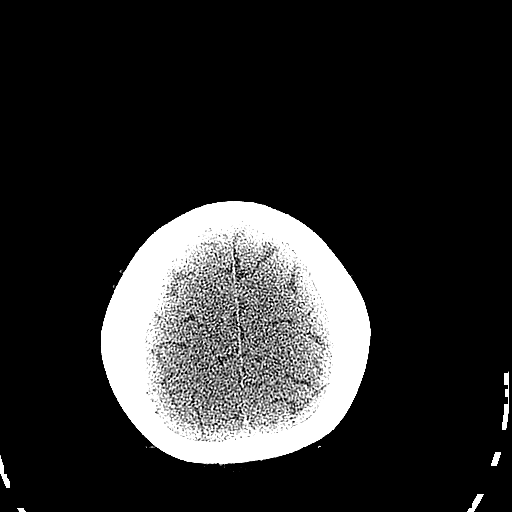
[im 50/60  brain]
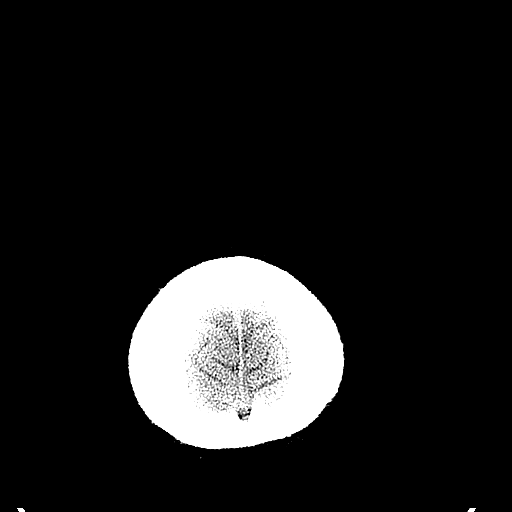
[im 50/60  bone]
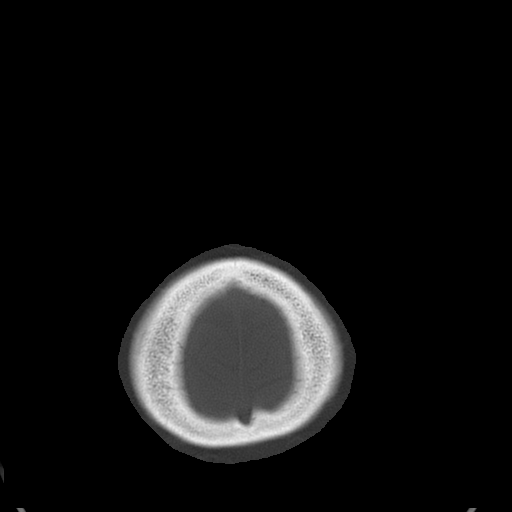
[im 53/60  brain]
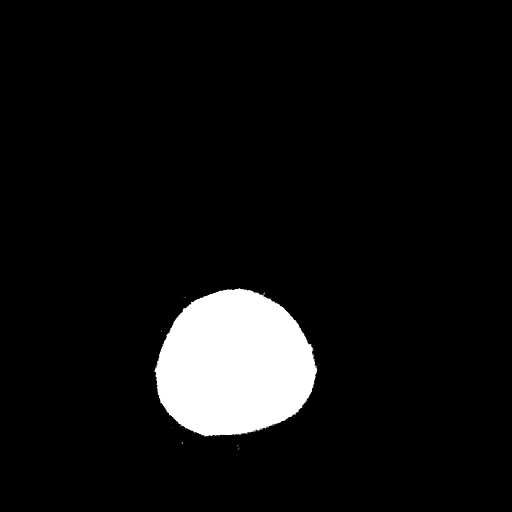
[im 56/60  brain]
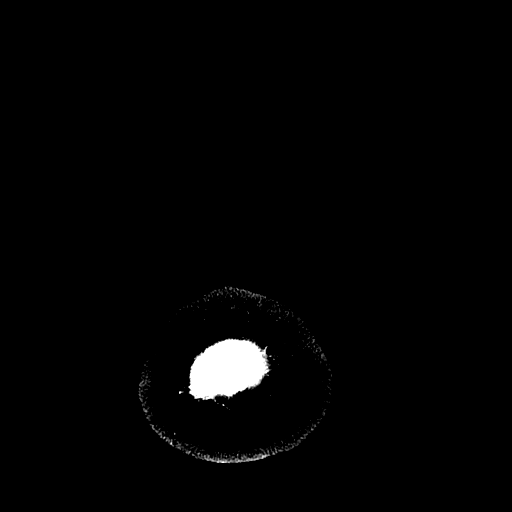

[15 of 30 positions shown; findings below may reference images not displayed]

FINDINGS: The ventricles and sulci are appropriate in size for patient's age.
Minimal periventricular and deep white matter hypodensities
represent chronic microvascular ischemic changes. There is no
intracranial hemorrhage. No mass effect or midline shift identified.

The visualized paranasal sinuses and mastoid air cells are well
aerated. The calvarium is intact.
IMPRESSION: No acute intracranial hemorrhage.

Mild chronic microvascular ischemic disease.

If symptoms persist and there are no contraindications, MRI may
provide better evaluation if clinically indicated

These results were called by telephone at the time of interpretation
on 06/18/2015 at [DATE] to nurse Avela , who verbally
acknowledged these results.

## 2017-02-19 NOTE — ED Provider Notes (Signed)
[facility] DEPT Provider Note   CSN: [id number] Arrival date & time: 02/17/17  2019     History   Chief Complaint Chief Complaint  Patient presents with  . Ankle Pain    HPI Katrina [name] is a 56 y.o. female.  Patient reports dropping a heavy object on her left foot 3 days ago. She now has pain on the dorsum of her foot in the lateral malleolus area. No posterior thigh or calf tenderness. No redness extending up the leg. No fever or chills. Severity of pain is mild. Positioning and palpation make pain worse.      Past Medical History:  Diagnosis Date  . Allergy   . Anemia 1975-1976   . Anginal pain ([place])   . Anxiety    takes Valium daily as needed  . Arthritis    "spine" (12/03/2016)  . Asthma    has inhalers but doesn't use (12/03/2016)  . CAD (coronary artery disease)    Moderate nonobstructive 2012-2013, [place]; No angiographic evidence of CAD 01/31/15 LHC  . Chronic bronchitis ([place])    "get it a couple times q yr" (12/03/2016)  . Chronic kidney disease   . Chronic lower back pain    budlging disc   . Claustrophobia   . COPD (chronic obstructive pulmonary disease) ([place])   . Daily headache   . Depression   . Diverticulitis   . Family history of adverse reaction to anesthesia    2 daughters gets extremely sick   . GERD (gastroesophageal reflux disease)    takes Dexilant daily  . Heart murmur   . History of blood transfusion 1975-1976 "several"   no abnormal reaction noted  . History of colitis   . History of colon polyps    benign  . History of gastric ulcer   . History of hiatal hernia    "small one"  . History of MRSA infection 2017  . Hyperlipidemia    was on meds but has been off over a yr  . Hypertension    takes Amlodipine and Maxzide daily  . Insomnia    takes Ambien nightly  . Iron deficiency anemia    "when I was young"  . Lung nodules   . Microscopic hematuria   . Migraine    "2-3/wk" (12/03/2016)  . MS (multiple sclerosis) ([place])      questionable per pt  . Myocardial infarction ([place]) 2000   "mild one"  . Osteoporosis   . Peripheral neuropathy    weakness,numbness,and tingling. Takes Gabapentin daily  . Pneumonia 06/2015  . PTSD (post-traumatic stress disorder) dx'd 2016/2017   "was dx'd w/bipolar in 1991; replaced w/PTSD dx 2016/2017" (12/03/2016)  . Restless leg syndrome    takes Requip daily  . Stroke [facility]) 2015; 2016; 2017   Plavix daily; left sided weaknes; left sided blindness on the left eye only (12/03/2016)  . Type II diabetes mellitus ([place])    "went off insulin in 2012/2013" (12/03/2016)    Patient Active Problem List   Diagnosis Date Noted  . Hypertension 12/03/2016  . Adverse food reaction 10/14/2016  . Pulmonary nodules 10/14/2016  . Non-seasonal allergic rhinitis due to fungal spores 10/14/2016  . Cigarette smoker 09/24/2016  . Cerebral microvascular disease 08/19/2016  . Non compliance with medical treatment 05/11/2016  . Leg pain, anterior, right 05/11/2016  . Syncope 05/02/2016  . Type 2 diabetes mellitus with diabetic neuropathy ([place]) 05/02/2016  . Restless leg syndrome 05/02/2016  . Cerebrovascular accident (CVA) ([place])   .  Vascular headache   . History of chest pain   . Generalized anxiety disorder   . Chronic bilateral low back pain without sciatica   . Peripheral neuropathy   . Migraine without status migrainosus, not intractable   . PTSD (post-traumatic stress disorder)   . History of cerebrovascular accident (CVA) with residual deficit   . CVA (cerebral vascular accident) ([place]) 04/03/2016  . Stroke (cerebrum) ([place]) 04/03/2016  . Post traumatic stress disorder 10/06/2015  . Precordial pain   . Migraine syndrome 01/24/2015  . Migraine 01/24/2015  . TIA (transient ischemic attack) 12/25/2014  . Hypertensive urgency   . Other chest pain   . Malignant hypertension   . Hypokalemia 04/16/2014  . Muscle weakness (generalized) 04/05/2014  . Stiffness of left hip joint 04/05/2014  .  Pain in left hip 04/05/2014  . Diastolic dysfunction 69/67/8938  . Normal coronary arteries 03/20/2014  . PUD (peptic ulcer disease) 03/20/2014  . Type 2 diabetes, uncontrolled, with neuropathy ([place]) 03/20/2014  . Morbid obesity due to excess calories ([place]) 03/20/2014  . Left-sided weakness 03/20/2014  . Paresthesias 03/20/2014  . Cerebrovascular disease 03/20/2014  . Dyspnea on exertion 03/05/2012  . Tobacco abuse 03/05/2012  . Moderate COPD (chronic obstructive pulmonary disease) ([place]) 11/23/2011  . GERD (gastroesophageal reflux disease) 11/23/2011  . Uncontrolled hypertension 11/23/2011    Past Surgical History:  Procedure Laterality Date  . ABDOMINAL AORTOGRAM N/A 12/03/2016   Procedure: Abdominal Aortogram;  Surgeon: Angelia Mould, MD;  Location: [place];  Service: Cardiovascular;  Laterality: N/A;  . ABDOMINAL HYSTERECTOMY  12/1987  . ADENOIDECTOMY  1975  . ANKLE SURGERY Bilateral 1993; 1995 X2   "stabilzation done; 1 on the right; 2 on the left"  . APPENDECTOMY  1989  . BACK SURGERY    . BREAST LUMPECTOMY Left    "benign tumor"  . CARDIAC CATHETERIZATION N/A 01/31/2015   Procedure: Left Heart Cath and Coronary Angiography;  Surgeon: Burnell Blanks, MD;  Location: [place] [facility];  Service: Cardiovascular;  Laterality: N/A;  . CARDIAC CATHETERIZATION  2009; 2011  . COLONOSCOPY    . DILATION AND CURETTAGE OF UTERUS    . ESOPHAGOGASTRODUODENOSCOPY    . LAPAROSCOPIC CHOLECYSTECTOMY  1996  . NASAL RECONSTRUCTION  1976  . NASAL SINUS SURGERY  1975  . POSTERIOR LUMBAR FUSION  2005  . RADIOLOGY WITH ANESTHESIA N/A 01/15/2016   Procedure: MRI LUMBAR SPINE WITHOUT;  Surgeon: Medication Radiologist, MD;  Location: [facility];  Service: Radiology;  Laterality: N/A;  . RADIOLOGY WITH ANESTHESIA N/A 06/29/2016   Procedure: MRI OF THE BRAIN WITH AND WITHOUT;  Surgeon: Medication Radiologist, MD;  Location: [facility];  Service: Radiology;  Laterality: N/A;  . RENAL  ANGIOGRAPHY N/A 12/03/2016   Procedure: Renal Angiography;  Surgeon: Angelia Mould, MD;  Location: [place] [facility];  Service: Cardiovascular;  Laterality: N/A;  . TONSILLECTOMY  1992  . TUBAL LIGATION    . 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 Authorizing Provider  amLODipine (NORVASC) 10 MG tablet Take 1 tablet (10 mg total) by mouth daily. 06/05/14  Yes Herminio Commons, MD  aspirin-acetaminophen-caffeine (EXCEDRIN MIGRAINE) [phone] MG tablet Take 2 tablets by mouth daily  as needed for migraine.    Yes [provider]  chlorthalidone (HYGROTON) 25 MG tablet Take 25 mg by mouth 2 (two) times daily.    Yes [provider]  clopidogrel (PLAVIX) 75 MG tablet Take 75 mg by mouth daily.   Yes [provider]  dexlansoprazole (DEXILANT) 60 MG capsule Take 1 tab by mouth every morning. 10/18/16  Yes Esterwood, Amy S, PA-C  gabapentin (NEURONTIN) 600 MG tablet Take 600 mg by mouth 4 (four) times daily.   Yes [provider]  losartan (COZAAR) 50 MG tablet Take 50 mg by mouth 2 (two) times daily.  06/09/16  Yes [provider]  rOPINIRole (REQUIP) 4 MG tablet Take 4 mg by mouth at bedtime.   Yes [provider]  spironolactone (ALDACTONE) 100 MG tablet Take 100 mg by mouth daily.   Yes [provider]  zolpidem (AMBIEN) 10 MG tablet Take 10 mg by mouth at bedtime.  03/25/16  Yes [provider]  diazepam (VALIUM) 5 MG tablet Take 1 tablet (5 mg total) by mouth every 8 (eight) hours as needed for anxiety. Patient not taking: Reported on 02/17/2017 05/04/16   Orvan Falconer, MD  doxycycline (VIBRAMYCIN) 100 MG capsule Take 1 capsule (100 mg total) by mouth 2 (two) times daily. One po bid x 7 days Patient not taking: Reported  on 02/17/2017 12/23/16   Daleen Bo, MD    Family History Family History  Problem Relation Age of Onset  . Coronary artery disease Father   . Emphysema Father   . Heart attack Father   . Stroke Father   . Cancer Father        Unsure of type   . Allergic rhinitis Father   . Depression Mother   . Cancer Mother        skin cancer  . Bipolar disorder Brother   . Drug abuse Brother   . Cancer Brother        leukemia  . Bipolar disorder Daughter   . Allergic rhinitis Daughter   . Asthma Daughter   . Urticaria Daughter   . Diabetes Maternal Grandmother   . Stomach cancer Maternal Grandfather   . Cancer Maternal Grandfather        stomach   . Angioedema Neg Hx   . Atopy Neg Hx   . Eczema Neg Hx   . Immunodeficiency Neg Hx     Social History Social History  Substance Use Topics  . Smoking status: Current Every Day Smoker    Packs/day: 1.50    Years: 45.00    Types: Cigarettes    Start date: 03/04/1971  . Smokeless tobacco: Never Used  . Alcohol use No     Allergies   Other; Penicillins; Zithromax [azithromycin]; Aspirin; Pineapple; Strawberry extract; Wheat bran; Yeast-related products; Aspartame and phenylalanine; Mushroom extract complex; and Nicardipine   Review of Systems Review of Systems  All other systems reviewed and are negative.    Physical Exam Updated Vital Signs BP (!) 218/99 (BP Location: Left Arm)   Pulse 79   Temp 98.8 F (37.1 C)   Resp 20   Ht 5' 6.5" (1.689 m)   Wt 96.2 kg (212 lb)   SpO2 99%   BMI 33.71 kg/m   Physical Exam  Constitutional: She is oriented to person, place, and time. She appears well-developed and well-nourished.  HENT:  Head: Normocephalic and atraumatic.  Eyes: Conjunctivae are normal.  Neck: Neck supple.  Cardiovascular: Normal rate  and regular rhythm.   Pulmonary/Chest: Effort normal and breath sounds normal.  Abdominal: Soft. Bowel sounds are normal.  Musculoskeletal: Normal range of motion.    Neurological: She is alert and oriented to person, place, and time.  Skin: Skin is warm and dry.  Psychiatric: She has a normal mood and affect. Her behavior is normal.  Nursing note and vitals reviewed.    ED Treatments / Results  Labs (all labs ordered are listed, but only abnormal results are displayed) Labs Reviewed - No data to display  EKG  EKG Interpretation None       Radiology Dg Ankle Complete Left  Result Date: 02/17/2017 CLINICAL DATA:  56 y/o F; Left ankle and pain without known injury. Prior history of foot and ankle fractures with surgical repair. EXAM: LEFT ANKLE COMPLETE - 3+ VIEW; LEFT FOOT - COMPLETE 3+ VIEW COMPARISON:  None. FINDINGS: Left ankle: Chronic fracture deformity of the lateral malleolus with a surgical screw. No acute fracture or dislocation identified. Talar dome is intact. Ankle mortise is symmetric on these nonstress views. Small dorsal and plantar calcaneal bone spurs. Left foot: There is no evidence of fracture, dislocation, or joint effusion. There is no evidence of arthropathy or other focal bone abnormality. Soft tissues are unremarkable. IMPRESSION: No acute fracture or dislocation identified. Chronic fracture deformity and postsurgical changes of lateral malleolus. Electronically Signed   By: Kristine Garbe M.D.   On: 02/17/2017 22:34   Dg Foot Complete Left  Result Date: 02/17/2017 CLINICAL DATA:  56 y/o F; Left ankle and pain without known injury. Prior history of foot and ankle fractures with surgical repair. EXAM: LEFT ANKLE COMPLETE - 3+ VIEW; LEFT FOOT - COMPLETE 3+ VIEW COMPARISON:  None. FINDINGS: Left ankle: Chronic fracture deformity of the lateral malleolus with a surgical screw. No acute fracture or dislocation identified. Talar dome is intact. Ankle mortise is symmetric on these nonstress views. Small dorsal and plantar calcaneal bone spurs. Left foot: There is no evidence of fracture, dislocation, or joint effusion. There  is no evidence of arthropathy or other focal bone abnormality. Soft tissues are unremarkable. IMPRESSION: No acute fracture or dislocation identified. Chronic fracture deformity and postsurgical changes of lateral malleolus. Electronically Signed   By: Kristine Garbe M.D.   On: 02/17/2017 22:34    Procedures Procedures (including critical care time)  Medications Ordered in ED Medications - No data to display   Initial Impression / Assessment and Plan / ED Course  I have reviewed the triage vital signs and the nursing notes.  Pertinent labs & imaging results that were available during my care of the patient were reviewed by me and considered in my medical decision making (see chart for details).     Plain films of left ankle and foot reveal no acute fracture. There is a small surgical screw in the lateral malleolus. These findings were discussed with the patient. Will apply ankle brace. Refer to orthopedics.  Final Clinical Impressions(s) / ED Diagnoses   Final diagnoses:  Acute left ankle pain  Left foot pain    New Prescriptions Discharge Medication List as of 02/17/2017 10:59 PM       Nat Christen, MD 02/19/17 1140

## 2017-02-21 ENCOUNTER — Encounter: Payer: Self-pay | Admitting: Cardiovascular Disease

## 2017-02-21 ENCOUNTER — Ambulatory Visit (INDEPENDENT_AMBULATORY_CARE_PROVIDER_SITE_OTHER): Payer: Medicare Other | Admitting: Cardiovascular Disease

## 2017-02-21 VITALS — BP 240/100 | HR 71 | Ht 66.0 in | Wt 213.0 lb

## 2017-02-21 DIAGNOSIS — Z8673 Personal history of transient ischemic attack (TIA), and cerebral infarction without residual deficits: Secondary | ICD-10-CM

## 2017-02-21 DIAGNOSIS — G4730 Sleep apnea, unspecified: Secondary | ICD-10-CM | POA: Diagnosis not present

## 2017-02-21 DIAGNOSIS — I10 Essential (primary) hypertension: Secondary | ICD-10-CM

## 2017-02-21 DIAGNOSIS — Z87898 Personal history of other specified conditions: Secondary | ICD-10-CM

## 2017-02-21 MED ORDER — HYDRALAZINE HCL 25 MG PO TABS
25.0000 mg | ORAL_TABLET | Freq: Three times a day (TID) | ORAL | 6 refills | Status: DC
Start: 2017-02-21 — End: 2017-05-02

## 2017-02-21 NOTE — Progress Notes (Signed)
SUBJECTIVE: The patient presents for management of malignant hypertension. I last evaluated her in August 2016. Renal artery Dopplers were normal on 06/21/14.  She also has a history of stroke. I referred her to [facility] in 2016. She was evaluated by a nephrologist there. At that time spironolactone was increased to 50 mg daily. She is currently taking 100 mg daily.  When she was hospitalized for a stroke, there appeared to be medication noncompliance.  She had repeat renal artery Dopplers in March 2018 which was suggestive of bilateral renal artery stenosis.  Renal angiography was attempted by vascular surgery on 12/03/16 but the procedure was aborted as the patient became very combative and anxious.  She ultimately underwent renal angiography by Dr. Oneida Alar on 12/06/16 which demonstrated normal aortic anatomy with no significant atherosclerosis and no significant renal artery stenosis bilaterally on multiple imaging views.  Coronary angiography on 01/31/15 showed no angiographic evidence of coronary disease with normal left ventricle systolic function.  She is currently taking amlodipine 10 mg, chlorthalidone 25 mg twice daily, losartan 50 mg twice daily, and spironolactone 100 mg daily.  Blood pressure is 240/100.  She has constant left-sided chest tightness.  She had a sleep study in 2016 which documented restless leg syndrome but no sleep apnea.  She was started back on spironolactone about one month ago by her PCP.  Labs 12/07/16: BUN 17, creatinine 0.98, hemoglobin 12.6  She continues to smoke.   Review of Systems: As per "subjective", otherwise negative.  Allergies  Allergen Reactions  . Other Anaphylaxis and Swelling    [place]  . Penicillins Anaphylaxis    Has patient had a PCN reaction causing immediate rash, facial/tongue/throat swelling, SOB or lightheadedness with hypotension: Yes Has patient had a PCN reaction causing severe rash involving mucus membranes or skin  necrosis: Yes Has patient had a PCN reaction that required hospitalization Yes Has patient had a PCN reaction occurring within the last 10 years: No If all of the above answers are "NO", then may proceed with Cephalosporin use.   . Zithromax [Azithromycin] Anaphylaxis  . Aspirin Other (See Comments)    Due to stomach ulcers.   . Pineapple Rash  . Strawberry Extract Rash and Hives  . Wheat Bran Other (See Comments)    Sneezing, rhinorrhea  . Yeast-Related Products Other (See Comments)    Sneezing, rhinorrhea  . Aspartame And Phenylalanine Palpitations  . Mushroom Extract Complex Rash  . Nicardipine Nausea And Vomiting and Other (See Comments)    shaking    Current Outpatient Prescriptions  Medication Sig Dispense Refill  . amLODipine (NORVASC) 10 MG tablet Take 1 tablet (10 mg total) by mouth daily. 30 tablet 6  . aspirin-acetaminophen-caffeine (EXCEDRIN MIGRAINE) 250-250-65 MG tablet Take 2 tablets by mouth daily as needed for migraine.     . chlorthalidone (HYGROTON) 25 MG tablet Take 25 mg by mouth 2 (two) times daily.     Marland Kitchen dexlansoprazole (DEXILANT) 60 MG capsule Take 1 tab by mouth every morning. 30 capsule 1  . diazepam (VALIUM) 5 MG tablet Take 1 tablet (5 mg total) by mouth every 8 (eight) hours as needed for anxiety. 30 tablet 0  . gabapentin (NEURONTIN) 600 MG tablet Take 600 mg by mouth 4 (four) times daily.    Marland Kitchen losartan (COZAAR) 50 MG tablet Take 50 mg by mouth 2 (two) times daily.   3  . rOPINIRole (REQUIP) 4 MG tablet Take 4 mg by mouth at bedtime.    Marland Kitchen  spironolactone (ALDACTONE) 100 MG tablet Take 100 mg by mouth daily.    Marland Kitchen zolpidem (AMBIEN) 10 MG tablet Take 10 mg by mouth at bedtime.     . clopidogrel (PLAVIX) 75 MG tablet Take 75 mg by mouth daily. PATIENT STATES SHES NOT TAKING     No current facility-administered medications for this visit.     Past Medical History:  Diagnosis Date  . Allergy   . Anemia 1975-1976   . Anginal pain ([place])   . Anxiety     takes Valium daily as needed  . Arthritis    "spine" (12/03/2016)  . Asthma    has inhalers but doesn't use (12/03/2016)  . CAD (coronary artery disease)    Moderate nonobstructive 2012-2013, [place]; No angiographic evidence of CAD 01/31/15 LHC  . Chronic bronchitis ([place])    "get it a couple times q yr" (12/03/2016)  . Chronic kidney disease   . Chronic lower back pain    budlging disc   . Claustrophobia   . COPD (chronic obstructive pulmonary disease) ([place])   . Daily headache   . Depression   . Diverticulitis   . Family history of adverse reaction to anesthesia    2 daughters gets extremely sick   . GERD (gastroesophageal reflux disease)    takes Dexilant daily  . Heart murmur   . History of blood transfusion 1975-1976 "several"   no abnormal reaction noted  . History of colitis   . History of colon polyps    benign  . History of gastric ulcer   . History of hiatal hernia    "small one"  . History of MRSA infection 2017  . Hyperlipidemia    was on meds but has been off over a yr  . Hypertension    takes Amlodipine and Maxzide daily  . Insomnia    takes Ambien nightly  . Iron deficiency anemia    "when I was young"  . Lung nodules   . Microscopic hematuria   . Migraine    "2-3/wk" (12/03/2016)  . MS (multiple sclerosis) ([place])    questionable per pt  . Myocardial infarction ([place]) 2000   "mild one"  . Osteoporosis   . Peripheral neuropathy    weakness,numbness,and tingling. Takes Gabapentin daily  . Pneumonia 06/2015  . PTSD (post-traumatic stress disorder) dx'd 2016/2017   "was dx'd w/bipolar in 1991; replaced w/PTSD dx 2016/2017" (12/03/2016)  . Restless leg syndrome    takes Requip daily  . 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 Type II diabetes mellitus ([place])    "went off insulin in 2012/2013" (12/03/2016)    Past Surgical History:  Procedure Laterality Date  . ABDOMINAL  AORTOGRAM N/A 12/03/2016   Procedure: Abdominal Aortogram;  Surgeon: Angelia Mould, MD;  Location: [facility];  Service: Cardiovascular;  Laterality: N/A;  . ABDOMINAL HYSTERECTOMY  12/1987  . ADENOIDECTOMY  1975  . ANKLE SURGERY Bilateral 1993; 1995 X2   "stabilzation done; 1 on the right; 2 on the left"  . APPENDECTOMY  1989  . BACK SURGERY    . BREAST LUMPECTOMY Left    "benign tumor"  . CARDIAC CATHETERIZATION N/A 01/31/2015   Procedure: Left Heart Cath and Coronary Angiography;  Surgeon: Burnell Blanks, MD;  Location: [place] [facility];  Service: Cardiovascular;  Laterality: N/A;  . CARDIAC CATHETERIZATION  2009; 2011  . COLONOSCOPY    .  DILATION AND CURETTAGE OF UTERUS    . ESOPHAGOGASTRODUODENOSCOPY    . LAPAROSCOPIC CHOLECYSTECTOMY  1996  . NASAL RECONSTRUCTION  1976  . NASAL SINUS SURGERY  1975  . POSTERIOR LUMBAR FUSION  2005  . RADIOLOGY WITH ANESTHESIA N/A 01/15/2016   Procedure: MRI LUMBAR SPINE WITHOUT;  Surgeon: Medication Radiologist, MD;  Location: [facility];  Service: Radiology;  Laterality: N/A;  . RADIOLOGY WITH ANESTHESIA N/A 06/29/2016   Procedure: MRI OF THE BRAIN WITH AND WITHOUT;  Surgeon: Medication Radiologist, MD;  Location: [facility];  Service: Radiology;  Laterality: N/A;  . RENAL ANGIOGRAPHY N/A 12/03/2016   Procedure: Renal Angiography;  Surgeon: Angelia Mould, MD;  Location: [place] [facility];  Service: Cardiovascular;  Laterality: N/A;  . TONSILLECTOMY  1992  . TUBAL LIGATION    . TUMOR EXCISION  1998   from back of skull; developed;  MRSA from the area that had to be packed    Social History   Social History  . Marital status: Divorced    Spouse name: N/A  . Number of children: 3  . Years of education: Some college   Occupational History  .      Disability for back pain   Social History Main Topics  . Smoking status: Current Every Day Smoker    Packs/day: 2.00    Years: 45.00    Types: Cigarettes    Start date:  03/04/1971  . Smokeless tobacco: Never Used  . Alcohol use No  . Drug use: No  . Sexual activity: No   Other Topics Concern  . Not on file   Social History Narrative   Lives at home with her daughter.   Right-handed.   1-2 cups coffee in the morning and 2 sodas per day.        Vitals:   02/21/17 0944  BP: (!) 240/100  Pulse: 71  SpO2: 98%  Weight: 213 lb (96.6 kg)  Height: 5\' 6"  (1.676 m)    Wt Readings from Last 3 Encounters:  02/21/17 213 lb (96.6 kg)  02/17/17 212 lb (96.2 kg)  12/23/16 241 lb (109.3 kg)     PHYSICAL EXAM General: NAD HEENT: Normal. Neck: No JVD, no thyromegaly. Lungs: Clear to auscultation bilaterally with normal respiratory effort. CV: Nondisplaced PMI.  Regular rate and rhythm, normal S1/S2, no S3/S4, no murmur. No pretibial or periankle edema.  No carotid bruit.   Abdomen: Soft, nontender, no distention.  Neurologic: Alert and oriented.  Psych: Normal affect. Skin: Normal. Musculoskeletal: No gross deformities.    ECG: Most recent ECG reviewed.   Labs: Lab Results  Component Value Date/Time   K 4.5 12/07/2016 02:56 AM   BUN 17 12/07/2016 02:56 AM   CREATININE 0.98 12/07/2016 02:56 AM   ALT 20 12/03/2016 11:17 AM   TSH 1.678 12/04/2016 02:16 AM   TSH 3.630 03/20/2014 12:37 AM   HGB 12.6 12/07/2016 02:56 AM     Lipids: Lab Results  Component Value Date/Time   LDLCALC 57 04/04/2016 05:17 AM   CHOL 114 04/04/2016 05:17 AM   TRIG 153 (H) 04/04/2016 05:17 AM   HDL 26 (L) 04/04/2016 05:17 AM       ASSESSMENT AND PLAN: 1. Malignant hypertension with end organ damage (history of stroke): She is currently taking amlodipine 10 mg, chlorthalidone 25 mg twice daily, losartan 50 mg twice daily, and spironolactone 100 mg daily. Blood pressure remains severely elevated. I will again try hydralazine 25 mg TID. I will also  obtain a sleep study as she has a history of snoring with likely sleep disordered breathing. Sleep apnea is a known  etiology of uncontrolled hypertension.  2. Sleep disorder breathing: I will also obtain a sleep study as she has a history of snoring with likely sleep disordered breathing. Sleep apnea is a known etiology of uncontrolled hypertension.  3. History of CVA: Continue Plavix.   Disposition: Follow up 2 months.  Time spent: 40 minutes, of which greater than 50% was spent reviewing symptoms, relevant blood tests and studies, and discussing management plan with the patient.    Kate Sable, M.D., F.A.C.C.

## 2017-02-21 NOTE — Patient Instructions (Signed)
Medication Instructions:   Begin Hydralazine 25mg  three times per day.  Continue all other medications.    Labwork: none  Testing/Procedures: none  Follow-Up: 2 months   Any Other Special Instructions Will Be Listed Below (If Applicable). You have been referred to:  [facility]   If you need a refill on your cardiac medications before your next appointment, please call your pharmacy.

## 2017-02-23 ENCOUNTER — Ambulatory Visit (HOSPITAL_COMMUNITY): Payer: Medicare Other

## 2017-02-23 ENCOUNTER — Telehealth (HOSPITAL_COMMUNITY): Payer: Self-pay | Admitting: Nurse Practitioner

## 2017-02-23 NOTE — Telephone Encounter (Signed)
02/23/17  said she had to go ahead and leave for [place] to be with her mom. - patient left a message

## 2017-02-24 ENCOUNTER — Telehealth (HOSPITAL_COMMUNITY): Payer: Self-pay | Admitting: Occupational Therapy

## 2017-02-24 NOTE — Addendum Note (Signed)
Addended by: Laurine Blazer on: 02/24/2017 02:47 PM   Modules accepted: Orders

## 2017-02-24 NOTE — Telephone Encounter (Signed)
Patient canceled her next 3 appts, she is out of town with her sick mom and will not be returnnig home until October 4th.

## 2017-02-25 ENCOUNTER — Encounter (HOSPITAL_COMMUNITY): Payer: Self-pay | Admitting: Occupational Therapy

## 2017-03-01 ENCOUNTER — Encounter (HOSPITAL_COMMUNITY): Payer: Self-pay

## 2017-03-03 ENCOUNTER — Encounter (HOSPITAL_COMMUNITY): Payer: Self-pay

## 2017-03-05 IMAGING — CT CT L SPINE W/O CM
3 series · 12 of 33 positions shown, 14 images · non-contrast
Comparison: Radiography 06/12/2014

ADDENDUM:
For some reason, head CT and cervical spine CT is being linked with
the lumbar study.

Head CT: The brain has a normal appearance without evidence of
malformation, atrophy, old or acute infarction, mass lesion,
hemorrhage, hydrocephalus or extra-axial collection. The calvarium
is unremarkable. The paranasal sinuses, middle ears and mastoids are
clear.
Cervical spine CT: No acute or traumatic finding. There is ordinary
osteoarthritis at the C1-2 articulation. There is ordinary
spondylosis at C4-5 and C5-6 with small endplate osteophytes. Mild
foraminal encroachment at those levels without severe stenosis.
There is minimal spondylosis at C6-7 with mild foraminal
encroachment on the left. No facet arthropathy of significance. No
focal lesion.
Head CT: Normal
Cervical spine CT: No acute or traumatic finding. Mild spondylosis.
CLINICAL DATA: Fell down 12 steps, striking a concrete wall. Injury
2 weeks ago. Low back pain. Previous lumbar fusion.
EXAM:
CT LUMBAR SPINE WITHOUT CONTRAST
TECHNIQUE: Multidetector CT imaging of the lumbar spine was performed without
intravenous contrast administration. Multiplanar CT image
reconstructions were also generated.

[Series 3: lumbar spine 2.0 b30s · axial · 0.30mm/px · z∈[-360,-202]mm · 4 of 115 slices shown, 5 images]
[im 18/115  soft-tissue]
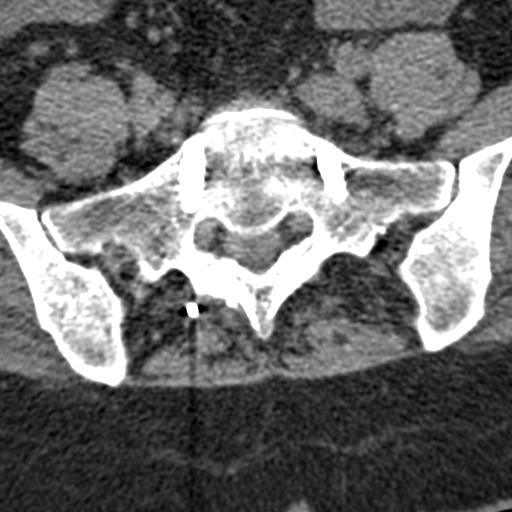
[im 18/115  bone]
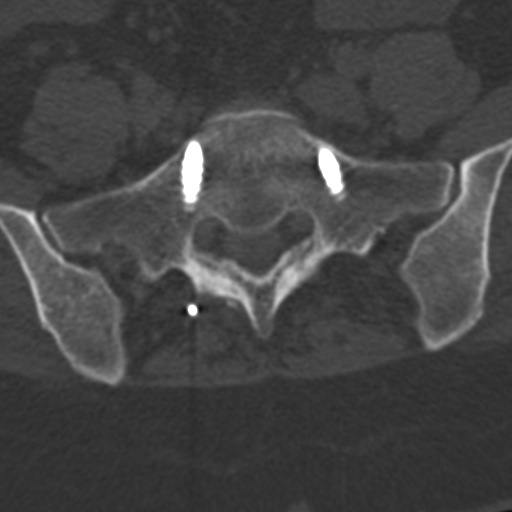
[im 44/115  bone]
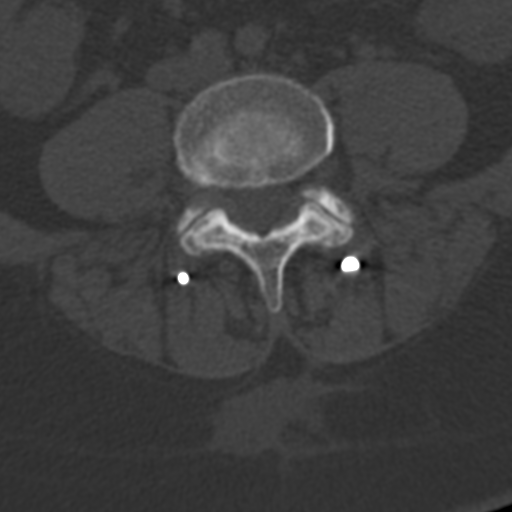
[im 71/115  bone]
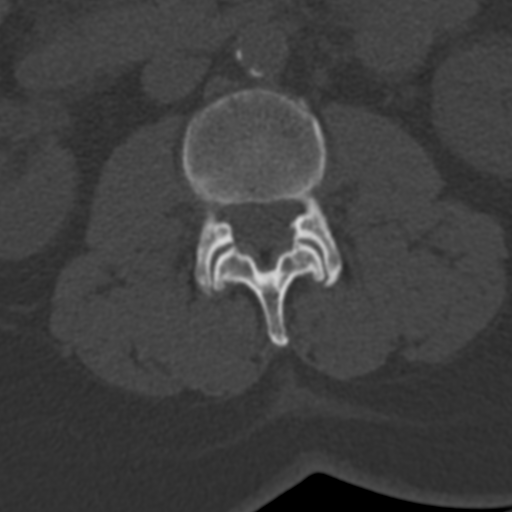
[im 97/115  bone]
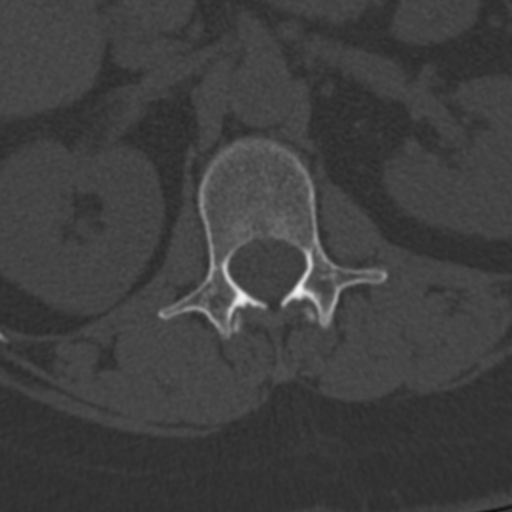

[Series 4: lumbar spine 2.0 spo · coronal · 0.35mm/px · 3 of 67 slices shown (1 of 2)]
[im 14/67  bone]
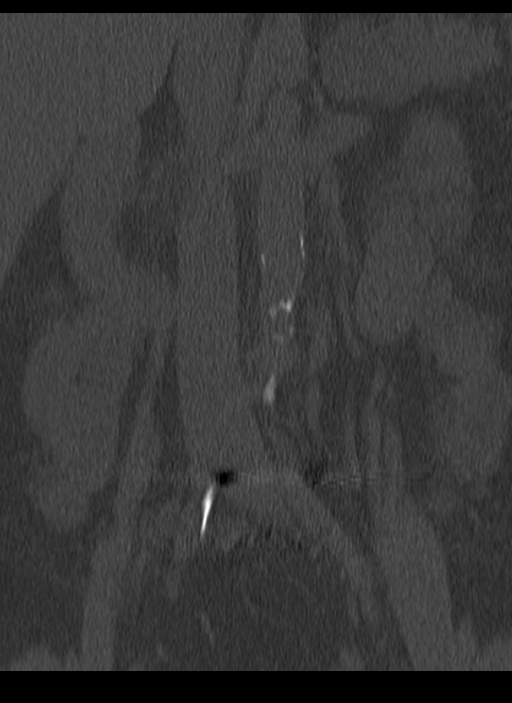
[im 27/67  bone]
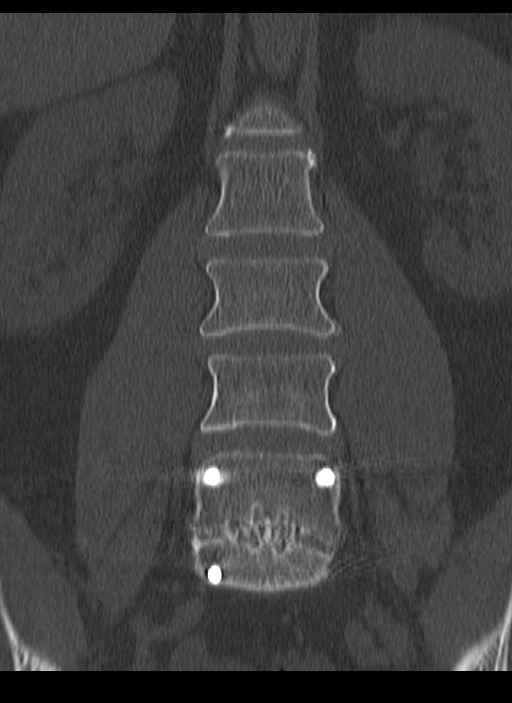
[im 40/67  bone]
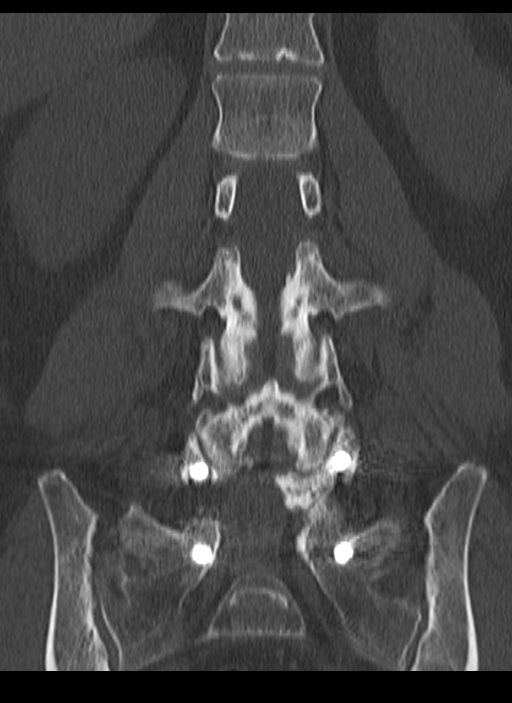

[Series 5: lumbar spine 2.0 spo · sagittal · 0.31mm/px · 5 of 75 slices shown, 6 images (2 of 2)]
[im 25/75  bone]
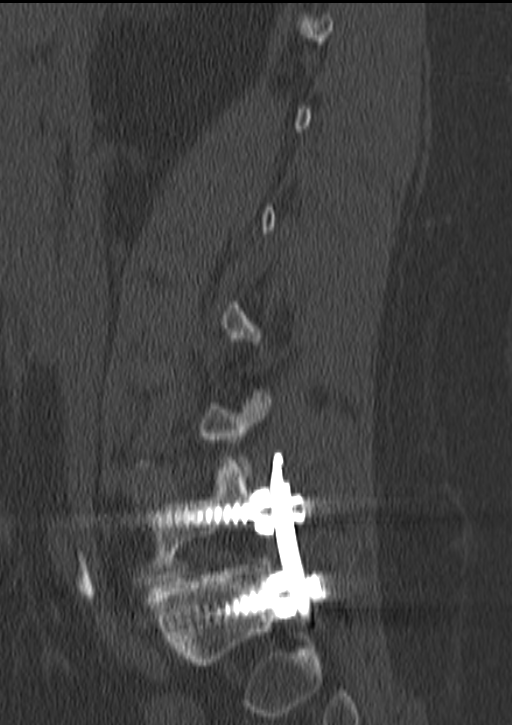
[im 31/75  bone]
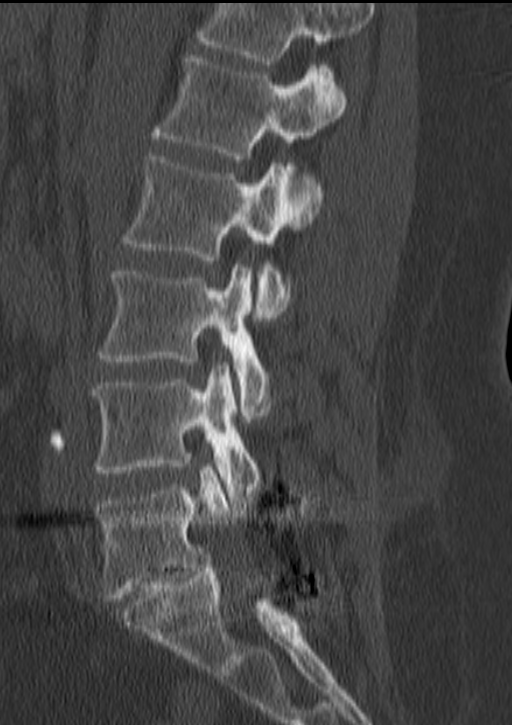
[im 38/75  soft-tissue]
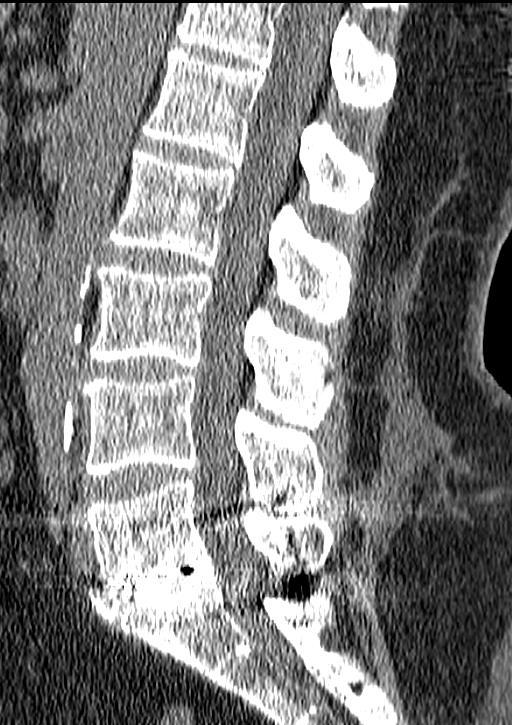
[im 38/75  bone]
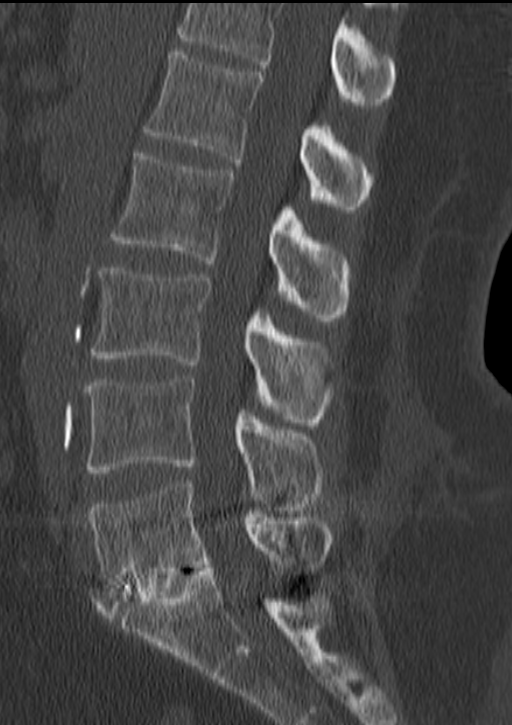
[im 44/75  bone]
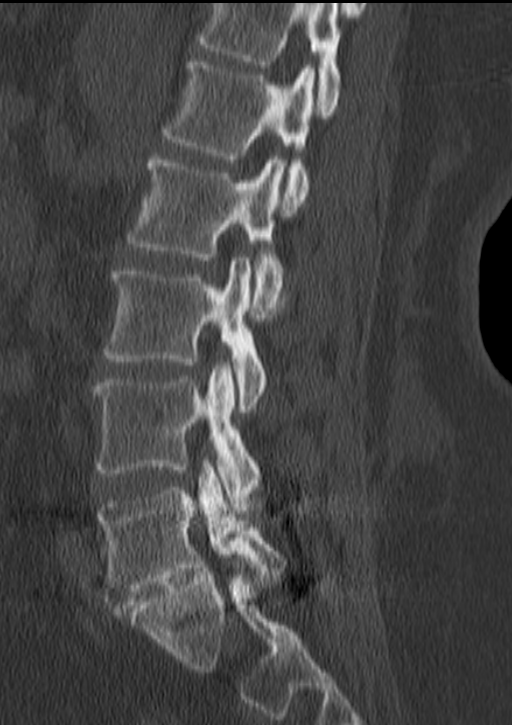
[im 50/75  bone]
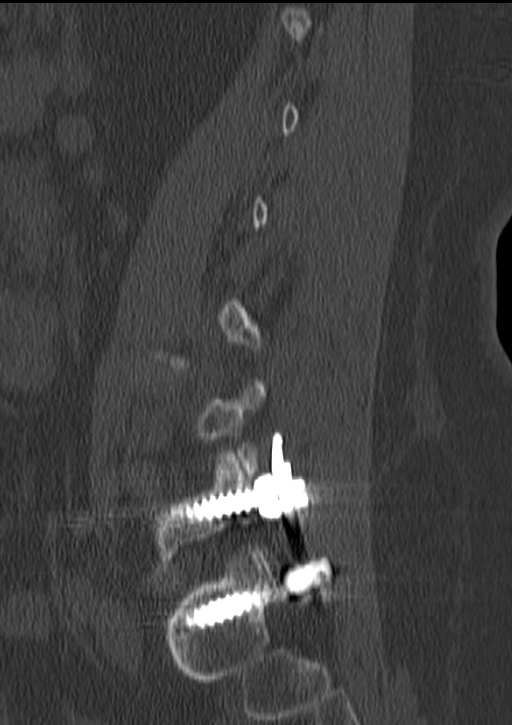

[12 of 33 positions shown; findings below may reference images not displayed]

FINDINGS: Alignment is normal. No evidence of fracture. No significant
degenerative change at L 2 3 or above.

L3-4: Bulging of the disc, focally prominent in the left extra
foraminal region. Mild facet degeneration and ligamentous
hypertrophy. No central canal stenosis. Mild foraminal to extra
foraminal encroachment on the left.

L4-5: Bulging of the disc, focally prominent in the right foraminal
to extra foraminal region. Facet degeneration and hypertrophy. No
central canal stenosis. Mild foraminal to extra foraminal
encroachment on the right.

L5-S1: Previous posterior decompression, diskectomy and fusion.
Fusion appears solid. Hardware well positioned. No canal or
foraminal compromise.

Mild osteoarthritis of the sacroiliac joints.
IMPRESSION: No acute or traumatic finding. Previous decompression, diskectomy
and fusion at L5-S1 has a satisfactory appearance.

Degenerative change at the L4-5 level. Foraminal to extra foraminal
encroachment on the right because of osteophyte and bulging disc
material.

Degenerative change at the L3-4 level. Foraminal to extra foraminal
encroachment on the left because of osteophyte and bulging disc
material.

## 2017-03-05 IMAGING — CT CT CERVICAL SPINE W/O CM
3 of 5 series · 9 of 33 positions shown, 11 images · non-contrast
Comparison: Radiography 06/12/2014

ADDENDUM:
For some reason, head CT and cervical spine CT is being linked with
the lumbar study.

Head CT: The brain has a normal appearance without evidence of
malformation, atrophy, old or acute infarction, mass lesion,
hemorrhage, hydrocephalus or extra-axial collection. The calvarium
is unremarkable. The paranasal sinuses, middle ears and mastoids are
clear.
Cervical spine CT: No acute or traumatic finding. There is ordinary
osteoarthritis at the C1-2 articulation. There is ordinary
spondylosis at C4-5 and C5-6 with small endplate osteophytes. Mild
foraminal encroachment at those levels without severe stenosis.
There is minimal spondylosis at C6-7 with mild foraminal
encroachment on the left. No facet arthropathy of significance. No
focal lesion.
Head CT: Normal
Cervical spine CT: No acute or traumatic finding. Mild spondylosis.
CLINICAL DATA: Fell down 12 steps, striking a concrete wall. Injury
2 weeks ago. Low back pain. Previous lumbar fusion.
EXAM:
CT LUMBAR SPINE WITHOUT CONTRAST
TECHNIQUE: Multidetector CT imaging of the lumbar spine was performed without
intravenous contrast administration. Multiplanar CT image
reconstructions were also generated.

[Series 7: sagittal bone 2.0 · sagittal · 0.18mm/px · 5 of 52 slices shown, 6 images]
[im 18/52  bone]
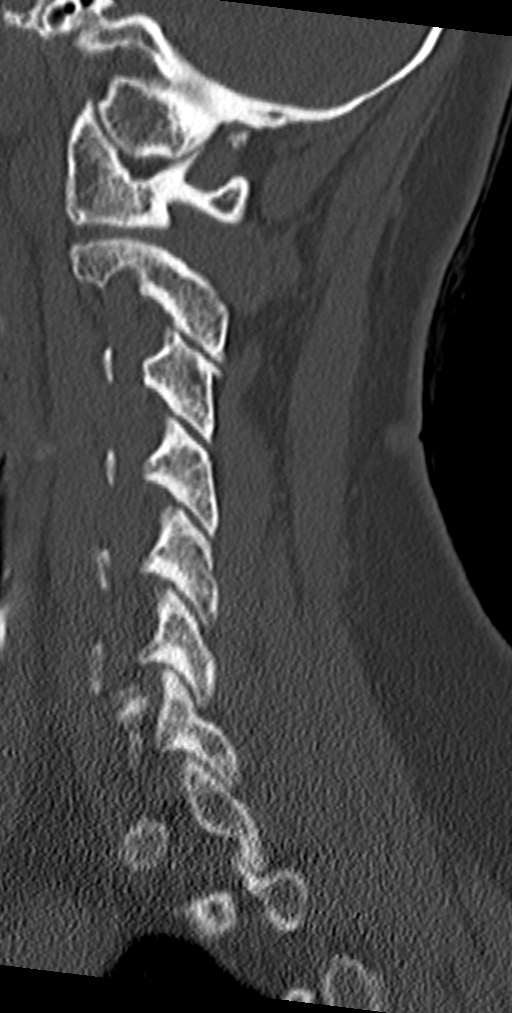
[im 22/52  bone]
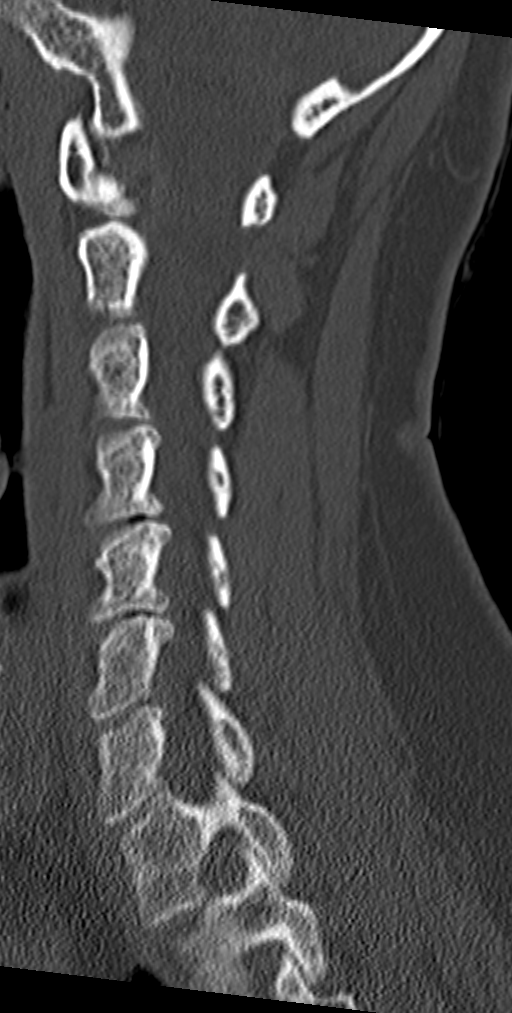
[im 26/52  soft-tissue]
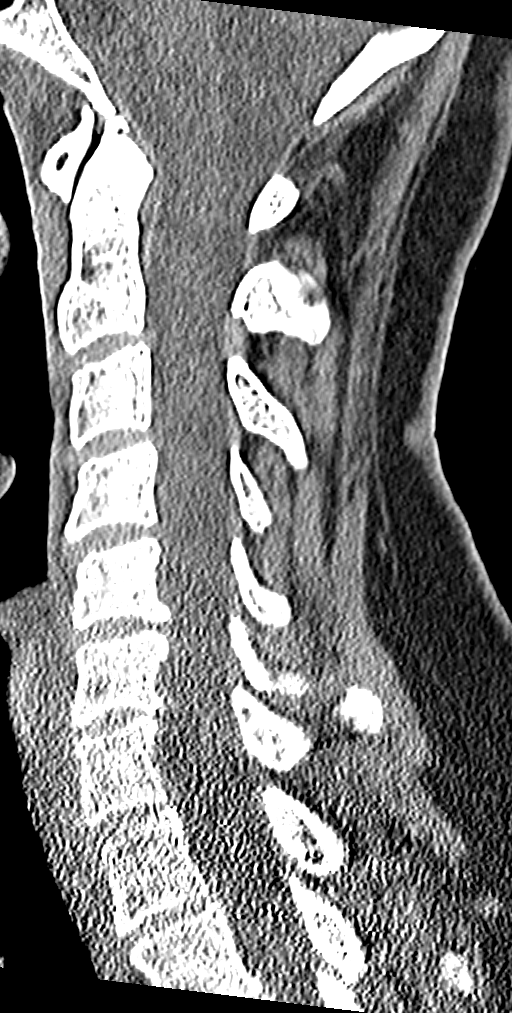
[im 26/52  bone]
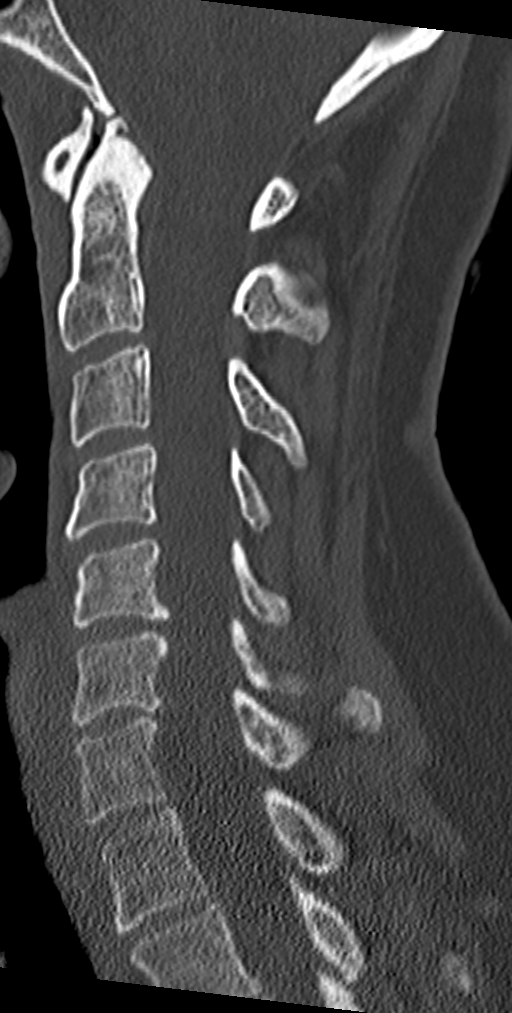
[im 30/52  bone]
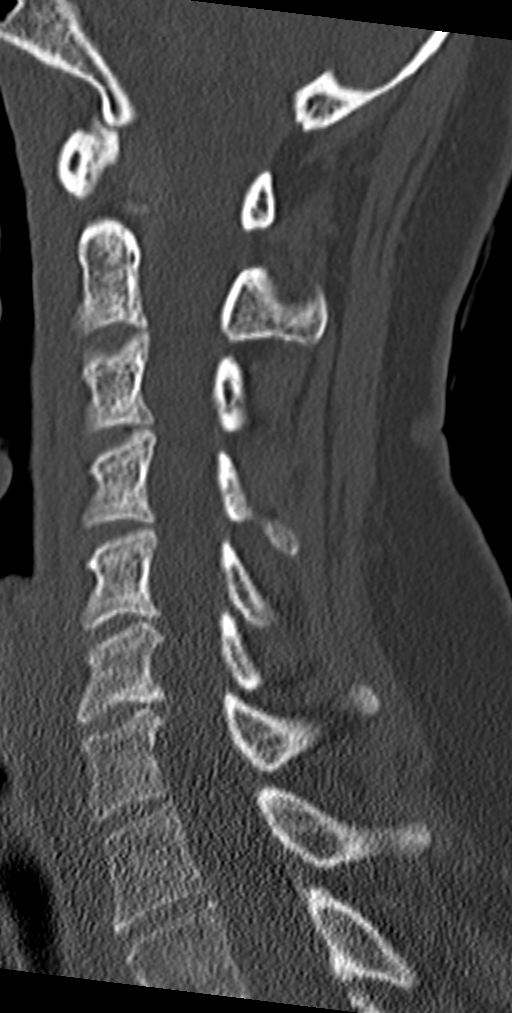
[im 35/52  bone]
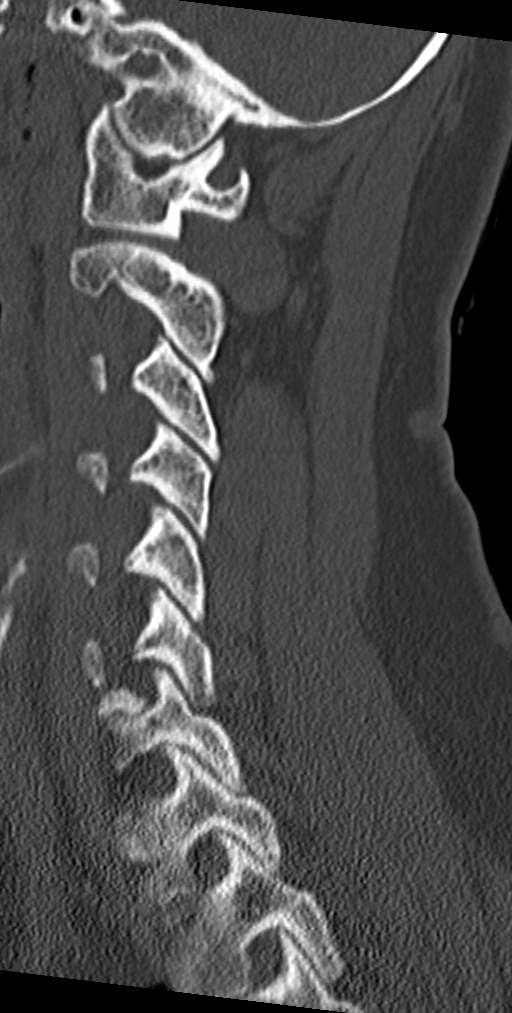

[Series 8: coronal bone 2.0 · coronal · 0.20mm/px · 3 of 45 slices shown]
[im 9/45  bone]
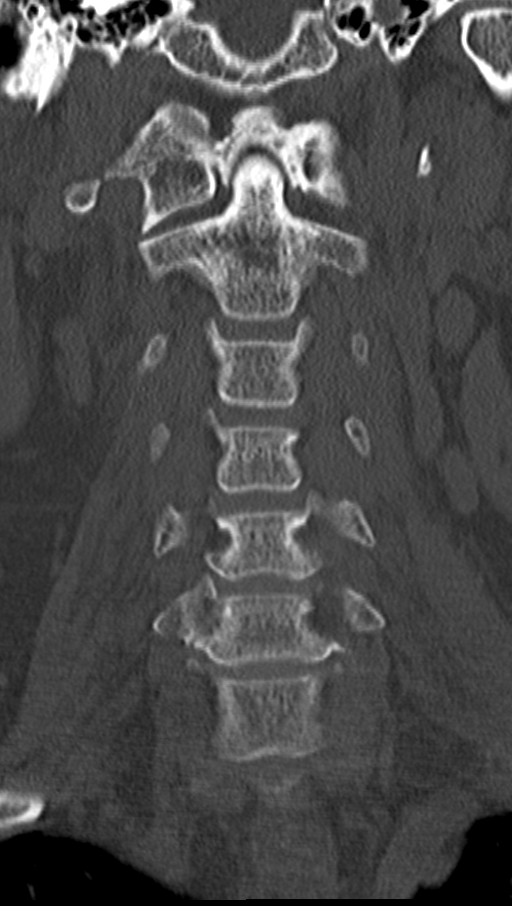
[im 18/45  bone]
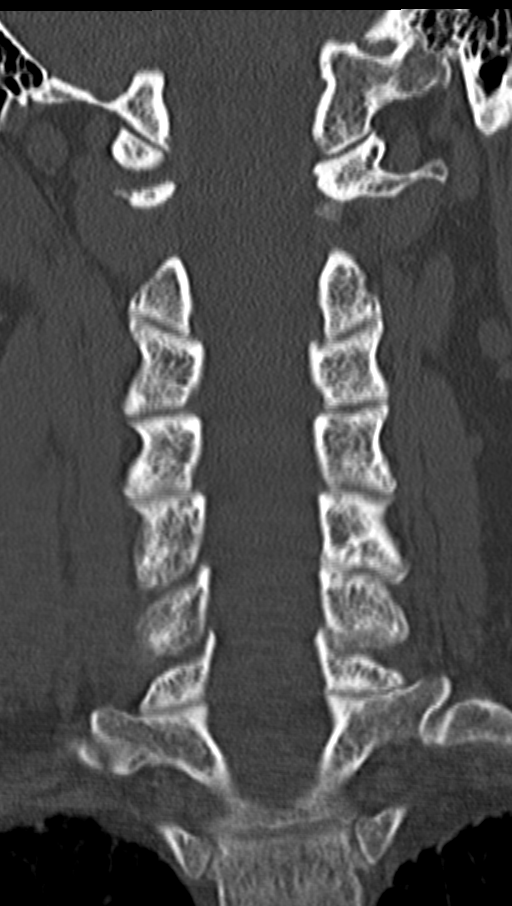
[im 27/45  bone]
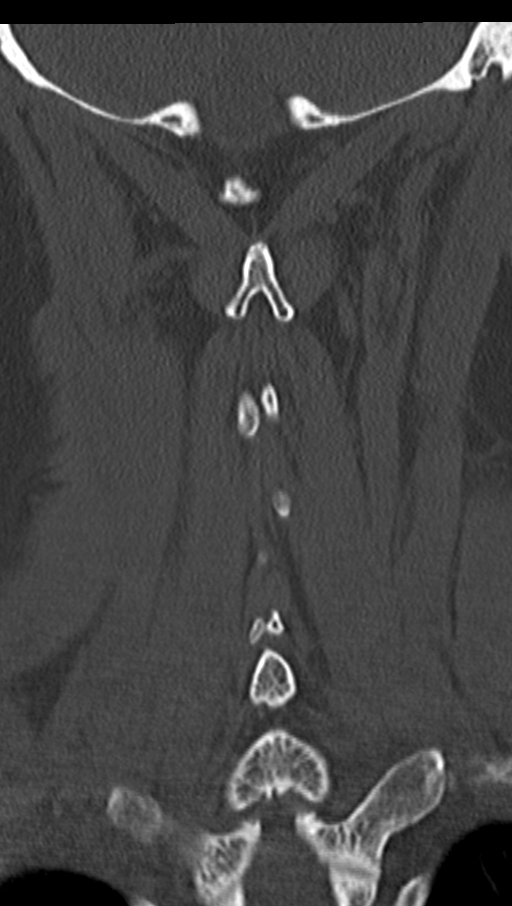

[Series 9: axial bone 2.0 · axial · 0.18mm/px · z∈[+146,+146]mm · 1 of 93 slices shown, 2 images]
[im 56/93  soft-tissue]
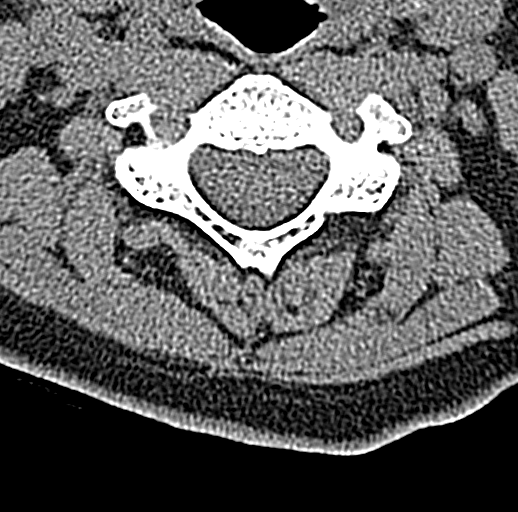
[im 56/93  bone]
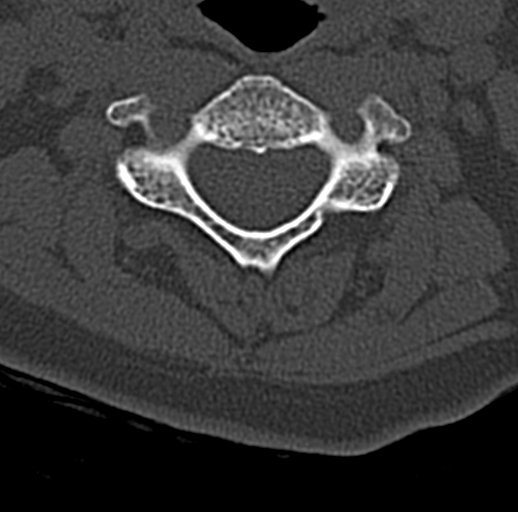

[9 of 33 positions shown; findings below may reference images not displayed]

FINDINGS: Alignment is normal. No evidence of fracture. No significant
degenerative change at L 2 3 or above.

L3-4: Bulging of the disc, focally prominent in the left extra
foraminal region. Mild facet degeneration and ligamentous
hypertrophy. No central canal stenosis. Mild foraminal to extra
foraminal encroachment on the left.

L4-5: Bulging of the disc, focally prominent in the right foraminal
to extra foraminal region. Facet degeneration and hypertrophy. No
central canal stenosis. Mild foraminal to extra foraminal
encroachment on the right.

L5-S1: Previous posterior decompression, diskectomy and fusion.
Fusion appears solid. Hardware well positioned. No canal or
foraminal compromise.

Mild osteoarthritis of the sacroiliac joints.
IMPRESSION: No acute or traumatic finding. Previous decompression, diskectomy
and fusion at L5-S1 has a satisfactory appearance.

Degenerative change at the L4-5 level. Foraminal to extra foraminal
encroachment on the right because of osteophyte and bulging disc
material.

Degenerative change at the L3-4 level. Foraminal to extra foraminal
encroachment on the left because of osteophyte and bulging disc
material.

## 2017-03-08 ENCOUNTER — Encounter (HOSPITAL_COMMUNITY): Payer: Self-pay

## 2017-03-10 ENCOUNTER — Encounter (HOSPITAL_COMMUNITY): Payer: Self-pay

## 2017-03-15 ENCOUNTER — Ambulatory Visit (HOSPITAL_COMMUNITY): Payer: Medicare Other | Attending: Orthopedic Surgery

## 2017-03-15 ENCOUNTER — Other Ambulatory Visit (HOSPITAL_COMMUNITY): Payer: Self-pay | Admitting: Nurse Practitioner

## 2017-03-15 ENCOUNTER — Encounter (HOSPITAL_COMMUNITY): Payer: Self-pay

## 2017-03-15 DIAGNOSIS — M25611 Stiffness of right shoulder, not elsewhere classified: Secondary | ICD-10-CM | POA: Insufficient documentation

## 2017-03-15 DIAGNOSIS — R29898 Other symptoms and signs involving the musculoskeletal system: Secondary | ICD-10-CM | POA: Insufficient documentation

## 2017-03-15 DIAGNOSIS — M25511 Pain in right shoulder: Secondary | ICD-10-CM

## 2017-03-15 DIAGNOSIS — M79661 Pain in right lower leg: Secondary | ICD-10-CM

## 2017-03-15 DIAGNOSIS — E041 Nontoxic single thyroid nodule: Secondary | ICD-10-CM

## 2017-03-15 NOTE — Therapy (Signed)
[place], [place], [address] Phone: [phone]   Fax:  [phone]  Occupational Therapy Treatment  Patient Details  Name: Katrina [name] MRN: [id number] Date of Birth: 1960/08/17 Referring Provider: Dr. Tania Ade  Encounter Date: 03/15/2017      OT End of Session - 03/15/17 0943    Visit Number 2   Number of Visits 16   Date for OT Re-Evaluation 04/18/17   Authorization Type 1) Medicare   Authorization Time Period before 10th visit   Authorization - Visit Number 2   Authorization - Number of Visits 10   OT Start Time [phone]   OT Stop Time 0945   OT Time Calculation (min) 40 min   Activity Tolerance Patient tolerated treatment well   Behavior During Therapy [facility] for tasks assessed/performed      Past Medical History:  Diagnosis Date  . Allergy   . Anemia 1975-1976   . Anginal pain ([place])   . Anxiety    takes Valium daily as needed  . Arthritis    "spine" (12/03/2016)  . Asthma    has inhalers but doesn't use (12/03/2016)  . CAD (coronary artery disease)    Moderate nonobstructive 2012-2013, [place]; No angiographic evidence of CAD 01/31/15 [facility]  . Chronic bronchitis ([place])    "get it a couple times q yr" (12/03/2016)  . Chronic kidney disease   . Chronic lower back pain    budlging disc   . Claustrophobia   . COPD (chronic obstructive pulmonary disease) ([place])   . Daily headache   . Depression   . Diverticulitis   . Family history of adverse reaction to anesthesia    2 daughters gets extremely sick   . GERD (gastroesophageal reflux disease)    takes Dexilant daily  . Heart murmur   . History of blood transfusion 1975-1976 "several"   no abnormal reaction noted  . History of colitis   . History of colon polyps    benign  . History of gastric ulcer   . History of hiatal hernia    "small one"  . History of MRSA infection 2017  . Hyperlipidemia    was on meds but has been off over a yr   . Hypertension    takes Amlodipine and Maxzide daily  . Insomnia    takes Ambien nightly  . Iron deficiency anemia    "when I was young"  . Lung nodules   . Microscopic hematuria   . Migraine    "2-3/wk" (12/03/2016)  . MS (multiple sclerosis) ([place])    questionable per pt  . Myocardial infarction ([place]) 2000   "mild one"  . Osteoporosis   . Peripheral neuropathy    weakness,numbness,and tingling. Takes Gabapentin daily  . Pneumonia 06/2015  . PTSD (post-traumatic stress disorder) dx'd 2016/2017   "was dx'd w/bipolar in 1991; replaced w/PTSD dx 2016/2017" (12/03/2016)  . Restless leg syndrome    takes Requip daily  . 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 Type II diabetes mellitus ([place])    "went off insulin in 2012/2013" (12/03/2016)    Past Surgical History:  Procedure Laterality Date  . ABDOMINAL AORTOGRAM N/A 12/03/2016   Procedure: Abdominal Aortogram;  Surgeon: Angelia Mould, MD;  Location: [place] [facility];  Service: Cardiovascular;  Laterality: N/A;  . ABDOMINAL HYSTERECTOMY  12/1987  . ADENOIDECTOMY  [place] Bilateral 1993; 1995 X2   "stabilzation done; 1 on the right; 2 on the left"  . APPENDECTOMY  1989  . BACK SURGERY    . BREAST LUMPECTOMY Left    "benign tumor"  . CARDIAC CATHETERIZATION N/A 01/31/2015   Procedure: Left Heart Cath and Coronary Angiography;  Surgeon: Burnell Blanks, MD;  Location: [place] [facility];  Service: Cardiovascular;  Laterality: N/A;  . CARDIAC CATHETERIZATION  2009; 2011  . COLONOSCOPY    . DILATION AND CURETTAGE OF UTERUS    . ESOPHAGOGASTRODUODENOSCOPY    . LAPAROSCOPIC CHOLECYSTECTOMY  1996  . NASAL RECONSTRUCTION  1976  . NASAL SINUS SURGERY  1975  . POSTERIOR LUMBAR FUSION  2005  . RADIOLOGY WITH ANESTHESIA N/A 01/15/2016   Procedure: MRI LUMBAR SPINE WITHOUT;  Surgeon: Medication Radiologist, MD;  Location: [facility];  Service: Radiology;  Laterality: N/A;  . RADIOLOGY WITH ANESTHESIA N/A 06/29/2016   Procedure: MRI OF THE BRAIN WITH AND WITHOUT;  Surgeon: Medication Radiologist, MD;  Location: [facility];  Service: Radiology;  Laterality: N/A;  . RENAL ANGIOGRAPHY N/A 12/03/2016   Procedure: Renal Angiography;  Surgeon: Angelia Mould, MD;  Location: [place] [facility];  Service: Cardiovascular;  Laterality: N/A;  . TONSILLECTOMY  1992  . TUBAL LIGATION    . TUMOR EXCISION  1998   from back of skull; developed;  MRSA from the area that had to be packed    There were no vitals filed for this visit.      Subjective Assessment - 03/15/17 0930    Subjective  S: I've been working on it.    Currently in Pain? Yes   Pain Score 5    Pain Location Shoulder   Pain Orientation Right   Pain Descriptors / Indicators Sore   Pain Type Acute pain   Pain Radiating Towards N/A   Pain Onset More than a month ago   Pain Frequency Constant   Aggravating Factors  use and movement   Pain Relieving Factors ice   Effect of Pain on Daily Activities severe effect   Multiple Pain Sites No            [facility] OT Assessment - 03/15/17 0919      Assessment   Diagnosis right shoulder arthroscopy, debridement, SAD, DCE     Precautions   Precautions Shoulder   Type of Shoulder Precautions Progress as tolerated.      AROM   Overall AROM Comments Assessed seated. IR/er adducted.    AROM Assessment Site Shoulder   Right/Left Shoulder Right   Right Shoulder Flexion 90 Degrees  previous: 60   Right Shoulder ABduction 75 Degrees  previous: 30   Right Shoulder Internal Rotation 90 Degrees  previous: same   Right Shoulder External Rotation 65 Degrees  previous: 50     PROM   Overall PROM Comments Assessed supine. IR/er adducted.   PROM Assessment Site Shoulder   Right/Left Shoulder Right   Right Shoulder Flexion 145 Degrees  previous: 76   Right Shoulder ABduction 130 Degrees  previous: 50   Right  Shoulder Internal Rotation 90 Degrees  previous: same   Right Shoulder External Rotation 70 Degrees  previous: 40     Strength   Overall Strength Unable to assess;Due to pain                  OT Treatments/Exercises (OP) - 03/15/17 0926      Exercises  Exercises Shoulder     Shoulder Exercises: Supine   Protraction PROM;AAROM;10 reps   Horizontal ABduction PROM;10 reps   External Rotation PROM;AAROM;10 reps   Internal Rotation PROM;AAROM;10 reps   Flexion PROM;AAROM;10 reps   ABduction PROM;10 reps     Shoulder Exercises: Therapy Ball   Flexion 10 reps;Limitations   Flexion Limitations standing. medium green ball on mat table due to back issues.   ABduction --   ABduction Limitations --     Manual Therapy   Manual Therapy Myofascial release   Manual therapy comments performed separately from all interventions   Myofascial Release Myofascial release and manual stretching completed to right upper arm, trapezius, and scapularis region to decrease fascial restrictions and increase joint mobility in a pain free zone.                 OT Education - 03/15/17 1150    Education provided Yes   Education Details Pt was given OT evaluation hand out for reviewing goals and plan of care. Pt given measurements to take to MD appointment. Pt was instructed to continue established HEP.    Person(s) Educated Patient   Methods Explanation;Handout   Comprehension Verbalized understanding          OT Short Term Goals - 03/15/17 1149      OT SHORT TERM GOAL #1   Title Patient will be educated and independent with HEP to increase functional performance during daily tasks while using RUE.    Time 4   Period Weeks   Status On-going     OT SHORT TERM GOAL #2   Title Patient will increase P/ROM to [facility] to increase ability to complete dressing tasks with less difficulty.    Time 4   Period Weeks   Status On-going     OT SHORT TERM GOAL #3   Title Patient will  increase RUE strength to 3/5 to increase ability to complete activities at waist level or above with more comfort.    Time 4   Period Weeks   Status On-going     OT SHORT TERM GOAL #4   Title Patient will decrease fascial restrictions to moderate amount to increase functional mobility of RUE needed during daily tasks.   Time 4   Period Weeks   Status On-going     OT SHORT TERM GOAL #5   Title Patient will decrease pain level to 5/10 or less during functional use of RUE.    Time 4   Period Weeks   Status On-going           OT Long Term Goals - 03/15/17 1150      OT LONG TERM GOAL #1   Title Patient will return to highest level of independence using RUE as dominant extremity for all tasks.    Time 8   Period Weeks   Status On-going     OT LONG TERM GOAL #2   Title Patient will increase RUE strength to 4/5 to increase ability to complete normal household tasks with less difficulty.    Time 8   Period Weeks   Status On-going     OT LONG TERM GOAL #3   Title Patient will increase A/ROM of RUE to WNL to increase ability to complete reaching tasks overhead.    Time 8   Period Weeks   Status On-going     OT LONG TERM GOAL #4   Title Patient will decrease pain level during functional tasks in  RUE to 3/10 or less.    Time 8   Period Weeks   Status On-going     OT LONG TERM GOAL #5   Title Patient will decrease fascial restrictions in RUE to min amount or less to increase functional mobility needed to complete tasks.    Time 8   Period Weeks   Status On-going               Plan - 03/15/17 1146    Clinical Impression Statement A: mini reassessment completed this date as it's been a month since initial evaluation and follow up appointment with MD is tomorrow. patient has shown improvement with Active and Passive ROM measurements althoug has pain. Pt reports that she is using her right arm more and she does not take anything for pain as nothing has been prescribed    Plan P: Continue to work on completing AA/RM as patient is able to tolerate.       Patient will benefit from skilled therapeutic intervention in order to improve the following deficits and impairments:  Decreased strength, Decreased range of motion, Pain, Increased edema, Increased fascial restricitons, Impaired UE functional use  Visit Diagnosis: Other symptoms and signs involving the musculoskeletal system  Acute pain of right shoulder  Stiffness of right shoulder, not elsewhere classified    Problem List Patient Active Problem List   Diagnosis Date Noted  . Hypertension 12/03/2016  . Adverse food reaction 10/14/2016  . Pulmonary nodules 10/14/2016  . Non-seasonal allergic rhinitis due to fungal spores 10/14/2016  . Cigarette smoker 09/24/2016  . Cerebral microvascular disease 08/19/2016  . Non compliance with medical treatment 05/11/2016  . Leg pain, anterior, right 05/11/2016  . Syncope 05/02/2016  . Type 2 diabetes mellitus with diabetic neuropathy ([place]) 05/02/2016  . Restless leg syndrome 05/02/2016  . Cerebrovascular accident (CVA) ([place])   . Vascular headache   . History of chest pain   . Generalized anxiety disorder   . Chronic bilateral low back pain without sciatica   . Peripheral neuropathy   . Migraine without status migrainosus, not intractable   . PTSD (post-traumatic stress disorder)   . History of cerebrovascular accident (CVA) with residual deficit   . CVA (cerebral vascular accident) ([place]) 04/03/2016  . Stroke (cerebrum) ([place]) 04/03/2016  . Post traumatic stress disorder 10/06/2015  . Precordial pain   . Migraine syndrome 01/24/2015  . Migraine 01/24/2015  . TIA (transient ischemic attack) 12/25/2014  . Hypertensive urgency   . Other chest pain   . Malignant hypertension   . Hypokalemia 04/16/2014  . Muscle weakness (generalized) 04/05/2014  . Stiffness of left hip joint 04/05/2014  . Pain in left hip 04/05/2014  . Diastolic dysfunction 69/67/8938   . Normal coronary arteries 03/20/2014  . PUD (peptic ulcer disease) 03/20/2014  . Type 2 diabetes, uncontrolled, with neuropathy ([place]) 03/20/2014  . Morbid obesity due to excess calories ([place]) 03/20/2014  . Left-sided weakness 03/20/2014  . Paresthesias 03/20/2014  . Cerebrovascular disease 03/20/2014  . Dyspnea on exertion 03/05/2012  . Tobacco abuse 03/05/2012  . Moderate COPD (chronic obstructive pulmonary disease) ([place]) 11/23/2011  . GERD (gastroesophageal reflux disease) 11/23/2011  . Uncontrolled hypertension 11/23/2011   Ailene Ravel, OTR/L,CBIS  [phone]  03/15/2017, 11:53 AM  [place], [place], [address] Phone: [phone]   Fax:  [phone]  Name: WETONA VIRAMONTES MRN: [id number] Date of Birth: 1960/10/13

## 2017-03-16 ENCOUNTER — Ambulatory Visit (HOSPITAL_COMMUNITY)
Admission: RE | Admit: 2017-03-16 | Discharge: 2017-03-16 | Disposition: A | Discharge status: Home / Self Care | Payer: Medicare Other | Source: Ambulatory Visit | Attending: Nurse Practitioner | Admitting: Nurse Practitioner

## 2017-03-16 DIAGNOSIS — M79661 Pain in right lower leg: Secondary | ICD-10-CM

## 2017-03-16 DIAGNOSIS — E041 Nontoxic single thyroid nodule: Secondary | ICD-10-CM | POA: Insufficient documentation

## 2017-03-17 ENCOUNTER — Encounter (HOSPITAL_COMMUNITY): Payer: Self-pay

## 2017-03-17 ENCOUNTER — Ambulatory Visit (HOSPITAL_COMMUNITY): Payer: Medicare Other

## 2017-03-17 DIAGNOSIS — M25511 Pain in right shoulder: Secondary | ICD-10-CM

## 2017-03-17 DIAGNOSIS — M25611 Stiffness of right shoulder, not elsewhere classified: Secondary | ICD-10-CM

## 2017-03-17 DIAGNOSIS — R29898 Other symptoms and signs involving the musculoskeletal system: Secondary | ICD-10-CM | POA: Diagnosis not present

## 2017-03-17 NOTE — Therapy (Signed)
[place], [place], [address] Phone: [phone]   Fax:  [phone]  Occupational Therapy Treatment  Patient Details  Name: Katrina [name] MRN: [id number] Date of Birth: 04-11-1961 Referring Provider: Dr. Tania Ade  Encounter Date: 03/17/2017      OT End of Session - 03/17/17 1014    Visit Number 3   Number of Visits 16   Date for OT Re-Evaluation 04/18/17   Authorization Type 1) Medicare   Authorization Time Period before 10th visit   Authorization - Visit Number 3   Authorization - Number of Visits 10   OT Start Time [phone]   OT Stop Time 0945   OT Time Calculation (min) 41 min   Activity Tolerance Patient tolerated treatment well;Patient limited by pain   Behavior During Therapy [facility] for tasks assessed/performed      Past Medical History:  Diagnosis Date  . Allergy   . Anemia 1975-1976   . Anginal pain ([place])   . Anxiety    takes Valium daily as needed  . Arthritis    "spine" (12/03/2016)  . Asthma    has inhalers but doesn't use (12/03/2016)  . CAD (coronary artery disease)    Moderate nonobstructive 2012-2013, [place]; No angiographic evidence of CAD 01/31/15 [facility]  . Chronic bronchitis ([place])    "get it a couple times q yr" (12/03/2016)  . Chronic kidney disease   . Chronic lower back pain    budlging disc   . Claustrophobia   . COPD (chronic obstructive pulmonary disease) ([place])   . Daily headache   . Depression   . Diverticulitis   . Family history of adverse reaction to anesthesia    2 daughters gets extremely sick   . GERD (gastroesophageal reflux disease)    takes Dexilant daily  . Heart murmur   . History of blood transfusion 1975-1976 "several"   no abnormal reaction noted  . History of colitis   . History of colon polyps    benign  . History of gastric ulcer   . History of hiatal hernia    "small one"  . History of MRSA infection 2017  . Hyperlipidemia    was on meds but  has been off over a yr  . Hypertension    takes Amlodipine and Maxzide daily  . Insomnia    takes Ambien nightly  . Iron deficiency anemia    "when I was young"  . Lung nodules   . Microscopic hematuria   . Migraine    "2-3/wk" (12/03/2016)  . MS (multiple sclerosis) ([place])    questionable per pt  . Myocardial infarction ([place]) 2000   "mild one"  . Osteoporosis   . Peripheral neuropathy    weakness,numbness,and tingling. Takes Gabapentin daily  . Pneumonia 06/2015  . PTSD (post-traumatic stress disorder) dx'd 2016/2017   "was dx'd w/bipolar in 1991; replaced w/PTSD dx 2016/2017" (12/03/2016)  . Restless leg syndrome    takes Requip daily  . 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 Type II diabetes mellitus ([place])    "went off insulin in 2012/2013" (12/03/2016)    Past Surgical History:  Procedure Laterality Date  . ABDOMINAL AORTOGRAM N/A 12/03/2016   Procedure: Abdominal Aortogram;  Surgeon: Angelia Mould, MD;  Location: [place] [facility];  Service: Cardiovascular;  Laterality: N/A;  . ABDOMINAL HYSTERECTOMY  12/1987  .  ADENOIDECTOMY  1975  . ANKLE SURGERY Bilateral 1993; 1995 X2   "stabilzation done; 1 on the right; 2 on the left"  . APPENDECTOMY  1989  . BACK SURGERY    . BREAST LUMPECTOMY Left    "benign tumor"  . CARDIAC CATHETERIZATION N/A 01/31/2015   Procedure: Left Heart Cath and Coronary Angiography;  Surgeon: Burnell Blanks, MD;  Location: [place] [facility];  Service: Cardiovascular;  Laterality: N/A;  . CARDIAC CATHETERIZATION  2009; 2011  . COLONOSCOPY    . DILATION AND CURETTAGE OF UTERUS    . ESOPHAGOGASTRODUODENOSCOPY    . LAPAROSCOPIC CHOLECYSTECTOMY  1996  . NASAL RECONSTRUCTION  1976  . NASAL SINUS SURGERY  1975  . POSTERIOR LUMBAR FUSION  2005  . RADIOLOGY WITH ANESTHESIA N/A 01/15/2016   Procedure: MRI LUMBAR SPINE WITHOUT;  Surgeon: Medication  Radiologist, MD;  Location: [place];  Service: Radiology;  Laterality: N/A;  . RADIOLOGY WITH ANESTHESIA N/A 06/29/2016   Procedure: MRI OF THE BRAIN WITH AND WITHOUT;  Surgeon: Medication Radiologist, MD;  Location: [place];  Service: Radiology;  Laterality: N/A;  . RENAL ANGIOGRAPHY N/A 12/03/2016   Procedure: Renal Angiography;  Surgeon: Angelia Mould, MD;  Location: [place] [facility];  Service: Cardiovascular;  Laterality: N/A;  . TONSILLECTOMY  1992  . TUBAL LIGATION    . TUMOR EXCISION  1998   from back of skull; developed;  MRSA from the area that had to be packed    There were no vitals filed for this visit.      Subjective Assessment - 03/17/17 0936    Subjective  S: The NP said to continue with therapy and if the pain doesn't get better in 4 weeks they will do a MRI and if there is damage from my fall I'll have to schedule another surgery.   Currently in Pain? Yes   Pain Score 7    Pain Location Shoulder   Pain Orientation Right   Pain Descriptors / Indicators Sore   Pain Type Acute pain            [facility] OT Assessment - 03/17/17 0938      Assessment   Diagnosis right shoulder arthroscopy, debridement, SAD, DCE     Precautions   Precautions Shoulder   Type of Shoulder Precautions Progress as tolerated.                   OT Treatments/Exercises (OP) - 03/17/17 0350      Exercises   Exercises Shoulder     Shoulder Exercises: Supine   Protraction PROM;10 reps   Horizontal ABduction PROM;10 reps   External Rotation PROM;10 reps   Internal Rotation PROM;10 reps   Flexion PROM;10 reps   ABduction PROM;10 reps     Shoulder Exercises: Therapy Ball   Flexion 10 reps;Limitations   Flexion Limitations standing. medium green ball on mat table due to back issues.     Modalities   Modalities Electrical Stimulation;Cryotherapy     Cryotherapy   Number Minutes Cryotherapy 10 Minutes   Cryotherapy Location Upper arm   Type of Cryotherapy Ice pack      Electrical Stimulation   Electrical Stimulation Location right shoulder   Electrical Stimulation Action interferential   Electrical Stimulation Parameters 6.2 CV   Electrical Stimulation Goals Pain;Edema     Manual Therapy   Manual Therapy Myofascial release   Manual therapy comments performed separately from all interventions   Myofascial Release Myofascial release  and manual stretching completed to right upper arm, trapezius, and scapularis region to decrease fascial restrictions and increase joint mobility in a pain free zone.                   OT Short Term Goals - 03/15/17 1149      OT SHORT TERM GOAL #1   Title Patient will be educated and independent with HEP to increase functional performance during daily tasks while using RUE.    Time 4   Period Weeks   Status On-going     OT SHORT TERM GOAL #2   Title Patient will increase P/ROM to [facility] to increase ability to complete dressing tasks with less difficulty.    Time 4   Period Weeks   Status On-going     OT SHORT TERM GOAL #3   Title Patient will increase RUE strength to 3/5 to increase ability to complete activities at waist level or above with more comfort.    Time 4   Period Weeks   Status On-going     OT SHORT TERM GOAL #4   Title Patient will decrease fascial restrictions to moderate amount to increase functional mobility of RUE needed during daily tasks.   Time 4   Period Weeks   Status On-going     OT SHORT TERM GOAL #5   Title Patient will decrease pain level to 5/10 or less during functional use of RUE.    Time 4   Period Weeks   Status On-going           OT Long Term Goals - 03/15/17 1150      OT LONG TERM GOAL #1   Title Patient will return to highest level of independence using RUE as dominant extremity for all tasks.    Time 8   Period Weeks   Status On-going     OT LONG TERM GOAL #2   Title Patient will increase RUE strength to 4/5 to increase ability to complete normal  household tasks with less difficulty.    Time 8   Period Weeks   Status On-going     OT LONG TERM GOAL #3   Title Patient will increase A/ROM of RUE to WNL to increase ability to complete reaching tasks overhead.    Time 8   Period Weeks   Status On-going     OT LONG TERM GOAL #4   Title Patient will decrease pain level during functional tasks in RUE to 3/10 or less.    Time 8   Period Weeks   Status On-going     OT LONG TERM GOAL #5   Title Patient will decrease fascial restrictions in RUE to min amount or less to increase functional mobility needed to complete tasks.    Time 8   Period Weeks   Status On-going               Plan - 03/17/17 1014    Clinical Impression Statement A: Pt with increased pain today. NP reports that patinet is to continue therapy and if pain does not improve return in 4 weeks for a MRI. ES and ice used for pain management. Therapist was able to increase ROM during supine P/ROM although patient did not tolerate very well.    Plan P: Continue to work on progressing back to AA/ROM supine and standing as able to tolerate. Work on pain Mudlogger.       Patient will benefit from skilled therapeutic  intervention in order to improve the following deficits and impairments:  Decreased strength, Decreased range of motion, Pain, Increased edema, Increased fascial restricitons, Impaired UE functional use  Visit Diagnosis: Other symptoms and signs involving the musculoskeletal system  Acute pain of right shoulder  Stiffness of right shoulder, not elsewhere classified    Problem List Patient Active Problem List   Diagnosis Date Noted  . Hypertension 12/03/2016  . Adverse food reaction 10/14/2016  . Pulmonary nodules 10/14/2016  . Non-seasonal allergic rhinitis due to fungal spores 10/14/2016  . Cigarette smoker 09/24/2016  . Cerebral microvascular disease 08/19/2016  . Non compliance with medical treatment 05/11/2016  . Leg pain, anterior, right  05/11/2016  . Syncope 05/02/2016  . Type 2 diabetes mellitus with diabetic neuropathy ([place]) 05/02/2016  . Restless leg syndrome 05/02/2016  . Cerebrovascular accident (CVA) ([place])   . Vascular headache   . History of chest pain   . Generalized anxiety disorder   . Chronic bilateral low back pain without sciatica   . Peripheral neuropathy   . Migraine without status migrainosus, not intractable   . PTSD (post-traumatic stress disorder)   . History of cerebrovascular accident (CVA) with residual deficit   . CVA (cerebral vascular accident) ([place]) 04/03/2016  . Stroke (cerebrum) ([place]) 04/03/2016  . Post traumatic stress disorder 10/06/2015  . Precordial pain   . Migraine syndrome 01/24/2015  . Migraine 01/24/2015  . TIA (transient ischemic attack) 12/25/2014  . Hypertensive urgency   . Other chest pain   . Malignant hypertension   . Hypokalemia 04/16/2014  . Muscle weakness (generalized) 04/05/2014  . Stiffness of left hip joint 04/05/2014  . Pain in left hip 04/05/2014  . Diastolic dysfunction 41/66/0630  . Normal coronary arteries 03/20/2014  . PUD (peptic ulcer disease) 03/20/2014  . Type 2 diabetes, uncontrolled, with neuropathy ([place]) 03/20/2014  . Morbid obesity due to excess calories ([place]) 03/20/2014  . Left-sided weakness 03/20/2014  . Paresthesias 03/20/2014  . Cerebrovascular disease 03/20/2014  . Dyspnea on exertion 03/05/2012  . Tobacco abuse 03/05/2012  . Moderate COPD (chronic obstructive pulmonary disease) ([place]) 11/23/2011  . GERD (gastroesophageal reflux disease) 11/23/2011  . Uncontrolled hypertension 11/23/2011   Ailene Ravel, OTR/L,CBIS  [phone]  03/17/2017, 10:20 AM  [place], [place], [address] Phone: [phone]   Fax:  [phone]  Name: LASHONTA PILLING MRN: [id number] Date of Birth: 04-28-1961

## 2017-03-19 ENCOUNTER — Emergency Department (HOSPITAL_COMMUNITY): Payer: Medicare Other

## 2017-03-19 ENCOUNTER — Encounter (HOSPITAL_COMMUNITY): Payer: Self-pay | Admitting: *Deleted

## 2017-03-19 ENCOUNTER — Other Ambulatory Visit: Payer: Self-pay

## 2017-03-19 DIAGNOSIS — Z79899 Other long term (current) drug therapy: Secondary | ICD-10-CM | POA: Insufficient documentation

## 2017-03-19 DIAGNOSIS — R0602 Shortness of breath: Secondary | ICD-10-CM | POA: Insufficient documentation

## 2017-03-19 DIAGNOSIS — I129 Hypertensive chronic kidney disease with stage 1 through stage 4 chronic kidney disease, or unspecified chronic kidney disease: Secondary | ICD-10-CM | POA: Insufficient documentation

## 2017-03-19 DIAGNOSIS — E119 Type 2 diabetes mellitus without complications: Secondary | ICD-10-CM | POA: Insufficient documentation

## 2017-03-19 DIAGNOSIS — R2240 Localized swelling, mass and lump, unspecified lower limb: Secondary | ICD-10-CM | POA: Insufficient documentation

## 2017-03-19 DIAGNOSIS — Z8673 Personal history of transient ischemic attack (TIA), and cerebral infarction without residual deficits: Secondary | ICD-10-CM | POA: Insufficient documentation

## 2017-03-19 DIAGNOSIS — N189 Chronic kidney disease, unspecified: Secondary | ICD-10-CM | POA: Insufficient documentation

## 2017-03-19 DIAGNOSIS — Z7901 Long term (current) use of anticoagulants: Secondary | ICD-10-CM | POA: Insufficient documentation

## 2017-03-19 DIAGNOSIS — I2510 Atherosclerotic heart disease of native coronary artery without angina pectoris: Secondary | ICD-10-CM | POA: Insufficient documentation

## 2017-03-19 DIAGNOSIS — G35 Multiple sclerosis: Secondary | ICD-10-CM | POA: Insufficient documentation

## 2017-03-19 DIAGNOSIS — I252 Old myocardial infarction: Secondary | ICD-10-CM | POA: Insufficient documentation

## 2017-03-19 DIAGNOSIS — E785 Hyperlipidemia, unspecified: Secondary | ICD-10-CM | POA: Diagnosis not present

## 2017-03-19 DIAGNOSIS — J449 Chronic obstructive pulmonary disease, unspecified: Secondary | ICD-10-CM | POA: Diagnosis not present

## 2017-03-19 DIAGNOSIS — G43909 Migraine, unspecified, not intractable, without status migrainosus: Secondary | ICD-10-CM | POA: Diagnosis not present

## 2017-03-19 DIAGNOSIS — F17210 Nicotine dependence, cigarettes, uncomplicated: Secondary | ICD-10-CM | POA: Diagnosis not present

## 2017-03-19 DIAGNOSIS — R079 Chest pain, unspecified: Secondary | ICD-10-CM | POA: Diagnosis present

## 2017-03-19 NOTE — ED Triage Notes (Signed)
Pt c/o left side chest heaviness, sob, headache that started today,

## 2017-03-20 ENCOUNTER — Emergency Department (HOSPITAL_COMMUNITY)
Admission: EM | Admit: 2017-03-20 | Discharge: 2017-03-20 | Disposition: A | Discharge status: Home / Self Care | Payer: Medicare Other | Attending: Emergency Medicine | Admitting: Emergency Medicine

## 2017-03-20 DIAGNOSIS — G43809 Other migraine, not intractable, without status migrainosus: Secondary | ICD-10-CM

## 2017-03-20 DIAGNOSIS — G43909 Migraine, unspecified, not intractable, without status migrainosus: Secondary | ICD-10-CM | POA: Diagnosis not present

## 2017-03-20 DIAGNOSIS — R079 Chest pain, unspecified: Secondary | ICD-10-CM

## 2017-03-20 DIAGNOSIS — I10 Essential (primary) hypertension: Secondary | ICD-10-CM

## 2017-03-20 LAB — CBC
HCT: 39.7 % (ref 36.0–46.0)
HEMOGLOBIN: 12.9 g/dL (ref 12.0–15.0)
MCH: 29.5 pg (ref 26.0–34.0)
MCHC: 32.5 g/dL (ref 30.0–36.0)
MCV: 90.8 fL (ref 78.0–100.0)
Platelets: 260 10*3/uL (ref 150–400)
RBC: 4.37 MIL/uL (ref 3.87–5.11)
RDW: 14.3 % (ref 11.5–15.5)
WBC: 10.6 10*3/uL — AB (ref 4.0–10.5)

## 2017-03-20 LAB — D-DIMER, QUANTITATIVE: D-Dimer, Quant: 0.49 ug/mL-FEU (ref 0.00–0.50)

## 2017-03-20 LAB — BASIC METABOLIC PANEL
ANION GAP: 11 (ref 5–15)
BUN: 18 mg/dL (ref 6–20)
CO2: 25 mmol/L (ref 22–32)
Calcium: 9.1 mg/dL (ref 8.9–10.3)
Chloride: 109 mmol/L (ref 101–111)
Creatinine, Ser: 0.7 mg/dL (ref 0.44–1.00)
GFR calc Af Amer: >60 mL/min (ref >60)
GLUCOSE: 89 mg/dL (ref 65–99)
POTASSIUM: 3.2 mmol/L — AB (ref 3.5–5.1)
Sodium: 145 mmol/L (ref 135–145)

## 2017-03-20 LAB — BRAIN NATRIURETIC PEPTIDE: B NATRIURETIC PEPTIDE 5: 43 pg/mL (ref 0.0–100.0)

## 2017-03-20 LAB — POCT I-STAT TROPONIN I: Troponin i, poc: 0.01 ng/mL (ref 0.00–0.08)

## 2017-03-20 LAB — TROPONIN I

## 2017-03-20 MED ORDER — KETOROLAC TROMETHAMINE 30 MG/ML IJ SOLN
15.0000 mg | Freq: Once | INTRAMUSCULAR | Status: AC
  Administered 2017-03-20: 15 mg via INTRAVENOUS
  Filled 2017-03-20: qty 1

## 2017-03-20 MED ORDER — PROCHLORPERAZINE EDISYLATE 5 MG/ML IJ SOLN
5.0000 mg | Freq: Once | INTRAMUSCULAR | Status: AC
  Administered 2017-03-20: 5 mg via INTRAVENOUS
  Filled 2017-03-20: qty 2

## 2017-03-20 MED ORDER — HYDRALAZINE HCL 20 MG/ML IJ SOLN
20.0000 mg | Freq: Once | INTRAMUSCULAR | Status: AC
  Administered 2017-03-20: 10 mg via INTRAVENOUS
  Filled 2017-03-20: qty 1

## 2017-03-20 NOTE — ED Notes (Signed)
Pt updated on plan of care,  

## 2017-03-20 NOTE — ED Notes (Signed)
ED Provider at bedside. 

## 2017-03-20 NOTE — ED Notes (Signed)
Pt states "that the pain medication did not help and that she does not know why we killed a tree to print out her paperwork, I know everything that there is to know about high blood pressure",

## 2017-03-20 NOTE — ED Notes (Signed)
Pt instructed to follow up with her pcp, pt states " they know I have high blood pressure"

## 2017-03-20 NOTE — ED Provider Notes (Signed)
[facility] DEPT Provider Note   CSN: [id number] Arrival date & time: 03/19/17  2316     History   Chief Complaint Chief Complaint  Patient presents with  . Chest Pain    HPI Katrina [name] is a 56 y.o. female.  Patient presents to the ER for evaluation of headache, chest pain, shortness of breath. Patient reports that symptoms began earlier today. She has a history of elevated blood pressure and reports frequent headaches secondary to her blood pressure. She reports that the headache began earlier today has a slight headache and very slowly and progressively worsened. She has had similar headaches in the past, this is not the worse headache of her life. She has also noticed that she feels a "heaviness" in her chest and is short of breath. She has noticed that she has some swelling of her feet.      Past Medical History:  Diagnosis Date  . Allergy   . Anemia 1975-1976   . Anginal pain ([place])   . Anxiety    takes Valium daily as needed  . Arthritis    "spine" (12/03/2016)  . Asthma    has inhalers but doesn't use (12/03/2016)  . CAD (coronary artery disease)    Moderate nonobstructive 2012-2013, [place]; No angiographic evidence of CAD 01/31/15 [facility]  . Chronic bronchitis ([place])    "get it a couple times q yr" (12/03/2016)  . Chronic kidney disease   . Chronic lower back pain    budlging disc   . Claustrophobia   . COPD (chronic obstructive pulmonary disease) ([place])   . Daily headache   . Depression   . Diverticulitis   . Family history of adverse reaction to anesthesia    2 daughters gets extremely sick   . GERD (gastroesophageal reflux disease)    takes Dexilant daily  . Heart murmur   . History of blood transfusion 1975-1976 "several"   no abnormal reaction noted  . History of colitis   . History of colon polyps    benign  . History of gastric ulcer   . History of hiatal hernia    "small one"  . History of MRSA infection 2017  . Hyperlipidemia    was on  meds but has been off over a yr  . Hypertension    takes Amlodipine and Maxzide daily  . Insomnia    takes Ambien nightly  . Iron deficiency anemia    "when I was young"  . Lung nodules   . Microscopic hematuria   . Migraine    "2-3/wk" (12/03/2016)  . MS (multiple sclerosis) ([place])    questionable per pt  . Myocardial infarction ([place]) 2000   "mild one"  . Osteoporosis   . Peripheral neuropathy    weakness,numbness,and tingling. Takes Gabapentin daily  . Pneumonia 06/2015  . PTSD (post-traumatic stress disorder) dx'd 2016/2017   "was dx'd w/bipolar in 1991; replaced w/PTSD dx 2016/2017" (12/03/2016)  . Restless leg syndrome    takes Requip daily  . 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 Type II diabetes mellitus ([place])    "went off insulin in 2012/2013" (12/03/2016)    Patient Active Problem List   Diagnosis Date Noted  . Hypertension 12/03/2016  . Adverse food reaction 10/14/2016  . Pulmonary nodules 10/14/2016  . Non-seasonal allergic rhinitis due to fungal spores 10/14/2016  . Cigarette smoker 09/24/2016  . Cerebral microvascular  disease 08/19/2016  . Non compliance with medical treatment 05/11/2016  . Leg pain, anterior, right 05/11/2016  . Syncope 05/02/2016  . Type 2 diabetes mellitus with diabetic neuropathy ([place]) 05/02/2016  . Restless leg syndrome 05/02/2016  . Cerebrovascular accident (CVA) ([place])   . Vascular headache   . History of chest pain   . Generalized anxiety disorder   . Chronic bilateral low back pain without sciatica   . Peripheral neuropathy   . Migraine without status migrainosus, not intractable   . PTSD (post-traumatic stress disorder)   . History of cerebrovascular accident (CVA) with residual deficit   . CVA (cerebral vascular accident) ([place]) 04/03/2016  . Stroke (cerebrum) ([place]) 04/03/2016  . Post traumatic stress disorder 10/06/2015  . Precordial pain   .  Migraine syndrome 01/24/2015  . Migraine 01/24/2015  . TIA (transient ischemic attack) 12/25/2014  . Hypertensive urgency   . Other chest pain   . Malignant hypertension   . Hypokalemia 04/16/2014  . Muscle weakness (generalized) 04/05/2014  . Stiffness of left hip joint 04/05/2014  . Pain in left hip 04/05/2014  . Diastolic dysfunction 09/32/3557  . Normal coronary arteries 03/20/2014  . PUD (peptic ulcer disease) 03/20/2014  . Type 2 diabetes, uncontrolled, with neuropathy ([place]) 03/20/2014  . Morbid obesity due to excess calories ([place]) 03/20/2014  . Left-sided weakness 03/20/2014  . Paresthesias 03/20/2014  . Cerebrovascular disease 03/20/2014  . Dyspnea on exertion 03/05/2012  . Tobacco abuse 03/05/2012  . Moderate COPD (chronic obstructive pulmonary disease) ([place]) 11/23/2011  . GERD (gastroesophageal reflux disease) 11/23/2011  . Uncontrolled hypertension 11/23/2011    Past Surgical History:  Procedure Laterality Date  . ABDOMINAL AORTOGRAM N/A 12/03/2016   Procedure: Abdominal Aortogram;  Surgeon: Angelia Mould, MD;  Location: [facility];  Service: Cardiovascular;  Laterality: N/A;  . ABDOMINAL HYSTERECTOMY  12/1987  . ADENOIDECTOMY  1975  . ANKLE SURGERY Bilateral 1993; 1995 X2   "stabilzation done; 1 on the right; 2 on the left"  . APPENDECTOMY  1989  . BACK SURGERY    . BREAST LUMPECTOMY Left    "benign tumor"  . CARDIAC CATHETERIZATION N/A 01/31/2015   Procedure: Left Heart Cath and Coronary Angiography;  Surgeon: Burnell Blanks, MD;  Location: [facility];  Service: Cardiovascular;  Laterality: N/A;  . CARDIAC CATHETERIZATION  2009; 2011  . COLONOSCOPY    . DILATION AND CURETTAGE OF UTERUS    . ESOPHAGOGASTRODUODENOSCOPY    . LAPAROSCOPIC CHOLECYSTECTOMY  1996  . NASAL RECONSTRUCTION  1976  . NASAL SINUS SURGERY  1975  . POSTERIOR LUMBAR FUSION  2005  . RADIOLOGY WITH ANESTHESIA N/A 01/15/2016   Procedure: MRI LUMBAR SPINE  WITHOUT;  Surgeon: Medication Radiologist, MD;  Location: [facility];  Service: Radiology;  Laterality: N/A;  . RADIOLOGY WITH ANESTHESIA N/A 06/29/2016   Procedure: MRI OF THE BRAIN WITH AND WITHOUT;  Surgeon: Medication Radiologist, MD;  Location: [facility];  Service: Radiology;  Laterality: N/A;  . RENAL ANGIOGRAPHY N/A 12/03/2016   Procedure: Renal Angiography;  Surgeon: Angelia Mould, MD;  Location: [facility];  Service: Cardiovascular;  Laterality: N/A;  . TONSILLECTOMY  1992  . TUBAL LIGATION    . 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 Authorizing Provider  amLODipine (NORVASC) 10 MG tablet Take 1 tablet (10 mg total) by mouth daily. 06/05/14   Herminio Commons, MD  aspirin-acetaminophen-caffeine (EXCEDRIN MIGRAINE) [phone] MG tablet Take 2 tablets by mouth daily as needed for migraine.     [provider]  chlorthalidone (HYGROTON) 25 MG tablet Take 25 mg by mouth 2 (two) times daily.     [provider]  clopidogrel (PLAVIX) 75 MG tablet Take 75 mg by mouth daily. PATIENT STATES SHES NOT TAKING    [provider]  dexlansoprazole (DEXILANT) 60 MG capsule Take 1 tab by mouth every morning. 10/18/16   Esterwood, Amy S, PA-C  diazepam (VALIUM) 5 MG tablet Take 1 tablet (5 mg total) by mouth every 8 (eight) hours as needed for anxiety. 05/04/16   Orvan Falconer, MD  gabapentin (NEURONTIN) 600 MG tablet Take 600 mg by mouth 4 (four) times daily.    [provider]  hydrALAZINE (APRESOLINE) 25 MG tablet Take 1 tablet (25 mg total) by mouth 3 (three) times daily. 02/21/17 05/22/17  Herminio Commons, MD  losartan (COZAAR) 50 MG tablet Take 50 mg by mouth 2 (two) times daily.  06/09/16   [provider]  rOPINIRole (REQUIP) 4 MG tablet Take 4  mg by mouth at bedtime.    [provider]  spironolactone (ALDACTONE) 100 MG tablet Take 100 mg by mouth daily.    [provider]  zolpidem (AMBIEN) 10 MG tablet Take 10 mg by mouth at bedtime.  03/25/16   [provider]    Family History Family History  Problem Relation Age of Onset  . Coronary artery disease Father   . Emphysema Father   . Heart attack Father   . Stroke Father   . Cancer Father        Unsure of type   . Allergic rhinitis Father   . Depression Mother   . Cancer Mother        skin cancer  . Bipolar disorder Brother   . Drug abuse Brother   . Cancer Brother        leukemia  . Bipolar disorder Daughter   . Allergic rhinitis Daughter   . Asthma Daughter   . Urticaria Daughter   . Diabetes Maternal Grandmother   . Stomach cancer Maternal Grandfather   . Cancer Maternal Grandfather        stomach   . Angioedema Neg Hx   . Atopy Neg Hx   . Eczema Neg Hx   . Immunodeficiency Neg Hx     Social History Social History  Substance Use Topics  . Smoking status: Current Every Day Smoker    Packs/day: 2.00    Years: 45.00    Types: Cigarettes    Start date: 03/04/1971  . Smokeless tobacco: Never Used  . Alcohol use No     Allergies   Other; Penicillins; Zithromax [azithromycin]; Aspirin; Pineapple; Strawberry extract; Wheat bran; Yeast-related products; Aspartame and phenylalanine; Mushroom extract complex; and Nicardipine   Review of Systems Review of Systems  Respiratory: Positive for shortness of breath.   Cardiovascular: Positive for chest pain and leg swelling.  Neurological: Positive for headaches.  All other systems reviewed and are negative.    Physical Exam Updated Vital Signs BP (!) 210/93   Pulse 80   Temp 98.9 F (37.2 C) (Oral)   Resp 18   Ht 5\' 6"  (1.676 m)  Wt 97.1 kg (214 lb)   SpO2 98%   BMI 34.54 kg/m   Physical Exam  Constitutional: She is oriented to person, place, and time. She appears  well-developed and well-nourished. No distress.  HENT:  Head: Normocephalic and atraumatic.  Right Ear: Hearing normal.  Left Ear: Hearing normal.  Nose: Nose normal.  Mouth/Throat: Oropharynx is clear and moist and mucous membranes are normal.  Eyes: Pupils are equal, round, and reactive to light. Conjunctivae and EOM are normal.  Neck: Normal range of motion. Neck supple.  Cardiovascular: Regular rhythm, S1 normal and S2 normal.  Exam reveals no gallop and no friction rub.   No murmur heard. Pulmonary/Chest: Effort normal and breath sounds normal. No respiratory distress. She exhibits no tenderness.  Abdominal: Soft. Normal appearance and bowel sounds are normal. There is no hepatosplenomegaly. There is no tenderness. There is no rebound, no guarding, no tenderness at McBurney's point and negative Murphy's sign. No hernia.  Musculoskeletal: Normal range of motion.  Neurological: She is alert and oriented to person, place, and time. She has normal strength. No cranial nerve deficit or sensory deficit. Coordination normal. GCS eye subscore is 4. GCS verbal subscore is 5. GCS motor subscore is 6.  Skin: Skin is warm, dry and intact. No rash noted. No cyanosis.  Psychiatric: She has a normal mood and affect. Her speech is normal and behavior is normal. Thought content normal.  Nursing note and vitals reviewed.    ED Treatments / Results  Labs (all labs ordered are listed, but only abnormal results are displayed) Labs Reviewed  BASIC METABOLIC PANEL - Abnormal; Notable for the following:       Result Value   Potassium 3.2 (*)    All other components within normal limits  CBC - Abnormal; Notable for the following:    WBC 10.6 (*)    All other components within normal limits  TROPONIN I  BRAIN NATRIURETIC PEPTIDE  D-DIMER, QUANTITATIVE (NOT AT [facility])  POCT I-STAT TROPONIN I    EKG  EKG Interpretation  Date/Time:  Saturday March 19 2017 23:42:14 EDT Ventricular Rate:  84 PR  Interval:  160 QRS Duration: 88 QT Interval:  404 QTC Calculation: 477 R Axis:   57 Text Interpretation:  Normal sinus rhythm Normal ECG Confirmed by Orpah Greek [id number]) on 03/20/2017 1:37:21 AM       Radiology Dg Chest 2 View  Result Date: 03/20/2017 CLINICAL DATA:  Left-sided chest heaviness and dyspnea, onset today EXAM: CHEST  2 VIEW COMPARISON:  11/27/2016 FINDINGS: Mild interstitial coarsening. No airspace consolidation. No effusion. Normal heart size. Unremarkable hilar and mediastinal contours. IMPRESSION: Mild interstitial coarsening.  No consolidation or effusion. Electronically Signed   By: Andreas Newport M.D.   On: 03/20/2017 00:16    Procedures Procedures (including critical care time)  Medications Ordered in ED Medications  ketorolac (TORADOL) 30 MG/ML injection 15 mg (not administered)  prochlorperazine (COMPAZINE) injection 5 mg (not administered)  hydrALAZINE (APRESOLINE) injection 20 mg (10 mg Intravenous Given 03/20/17 0230)     Initial Impression / Assessment and Plan / ED Course  I have reviewed the triage vital signs and the nursing notes.  Pertinent labs & imaging results that were available during my care of the patient were reviewed by me and considered in my medical decision making (see chart for details).     Patient presents to the emergency department for evaluation of chest pain, elevated blood pressure, headache. Patient reports a history of chronic  and severe hypertension. She reports that she keeps a headache daily and frequently has worsening headache secondary to her blood pressure. She is experiencing a headache tonight that brought her to the ER. She has had similar type headaches in the past. Patient is very hypertensive at arrival, blood pressure 226/105. This has improved somewhat with hydralazine. Patient would not take the full dose of hydralazine, reports that her blood pressure drops too much she has problems. She reports  that she stays in around 818 systolic normally.  Patient is complaining of chest pain and shortness of breath. She does not have any evidence of congestive heart failure. D-dimer is negative. Reviewing her records reveals that she had heart catheterization 2 years ago that showed no coronary artery disease. She has had 2 negative troponins. She does not require hospitalization for chest pain.  She has normal neurologic function. As she has had similar headaches in the past, has no neurologic findings, blood pressure is at baseline, I do not feel that she requires imaging at this time.  Final Clinical Impressions(s) / ED Diagnoses   Final diagnoses:  Essential hypertension  Chest pain, unspecified type  Other migraine without status migrainosus, not intractable    New Prescriptions New Prescriptions   No medications on file     Orpah Greek, MD 03/20/17 [phone]

## 2017-03-22 ENCOUNTER — Ambulatory Visit (HOSPITAL_COMMUNITY): Payer: Medicare Other

## 2017-03-22 ENCOUNTER — Telehealth (HOSPITAL_COMMUNITY): Payer: Self-pay | Admitting: Nurse Practitioner

## 2017-03-22 NOTE — Telephone Encounter (Signed)
03/22/17  pt said that she has the flu and will not come this week

## 2017-03-23 IMAGING — MR MR HEAD W/O CM
3 series · 48 of 48 positions shown · non-contrast
Comparison: CT head July 02, 2015 and MRI of the head December 25, 2014

CLINICAL DATA: Acute onset LEFT-sided weakness at 0400 hours. Sharp
headache. History of stroke in the past with similar symptoms,
subsequently resolved. History of hypertension, hyperlipidemia and
diabetes.

EXAM:
MRI HEAD WITHOUT CONTRAST
TECHNIQUE: Axial diffusion weighted imaging and axial MPGR.

[Series 3: DWI · axial · 3.0mm · 1.09mm/px · z∈[-95,+36]mm · 28 of 90 slices shown (1 of 2)]
[im 1/90]
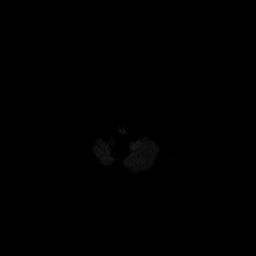
[im 4/90]
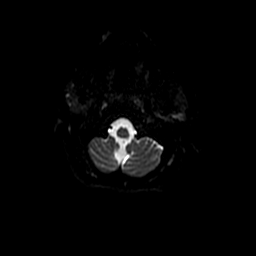
[im 7/90]
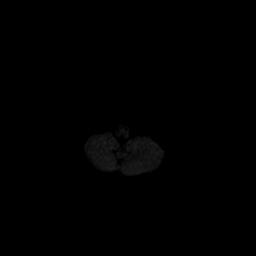
[im 10/90]
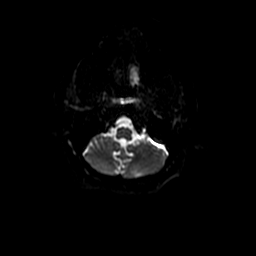
[im 14/90]
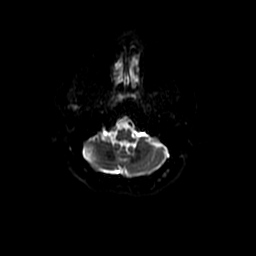
[im 17/90]
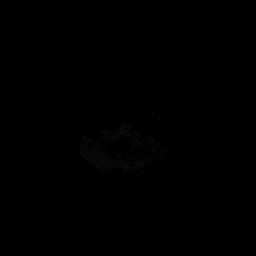
[im 20/90]
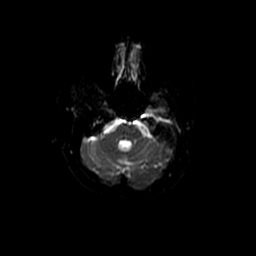
[im 24/90]
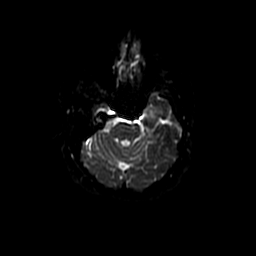
[im 27/90]
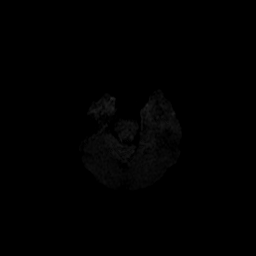
[im 30/90]
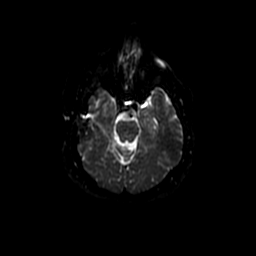
[im 33/90]
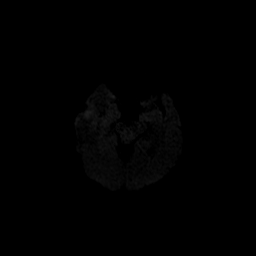
[im 37/90]
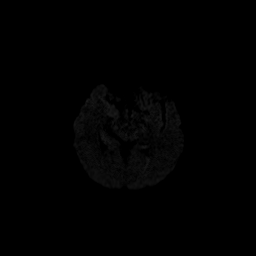
[im 40/90]
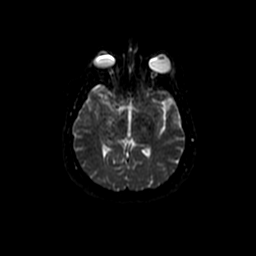
[im 43/90]
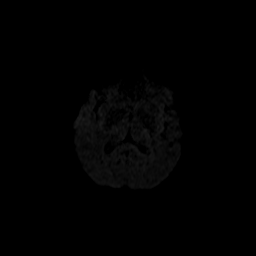
[im 47/90]
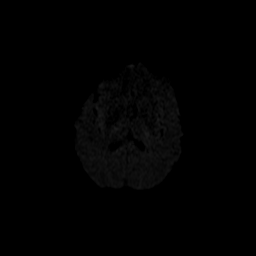
[im 50/90]
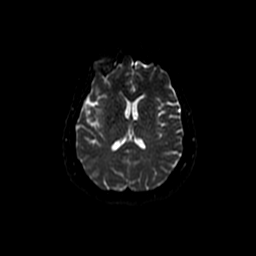
[im 53/90]
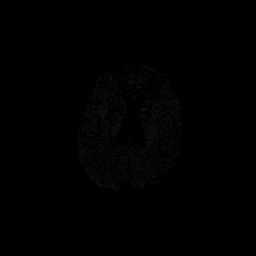
[im 57/90]
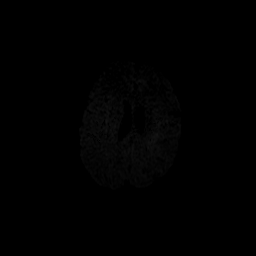
[im 60/90]
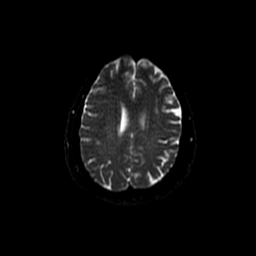
[im 63/90]
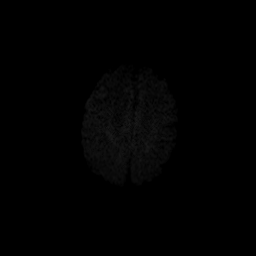
[im 66/90]
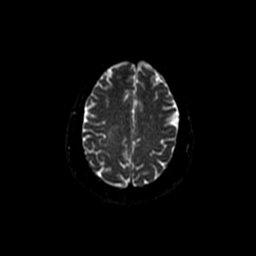
[im 70/90]
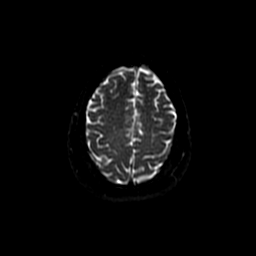
[im 73/90]
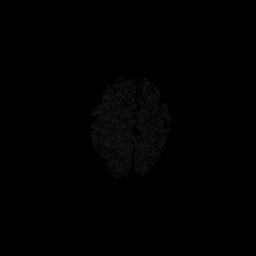
[im 76/90]
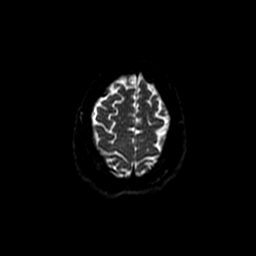
[im 80/90]
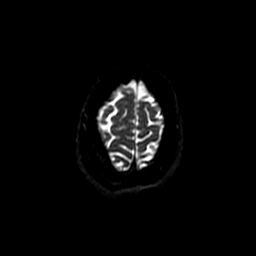
[im 83/90]
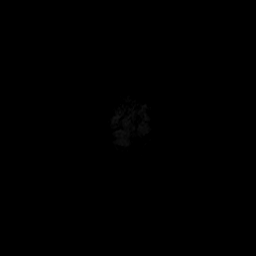
[im 86/90]
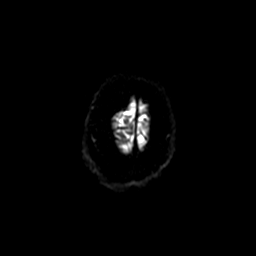
[im 90/90]
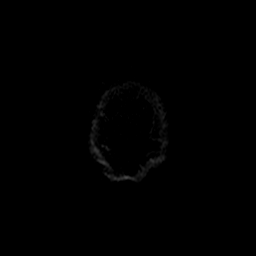

[Series 4: ax mpgr · axial · 5.0mm · 0.43mm/px · z∈[-94,+45]mm · 6 of 21 slices shown]
[im 1/21]
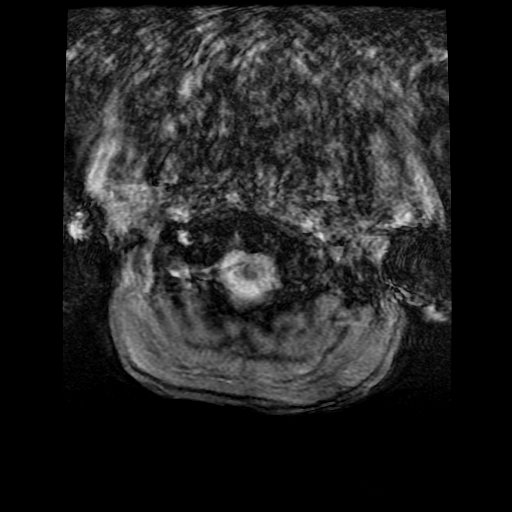
[im 5/21]
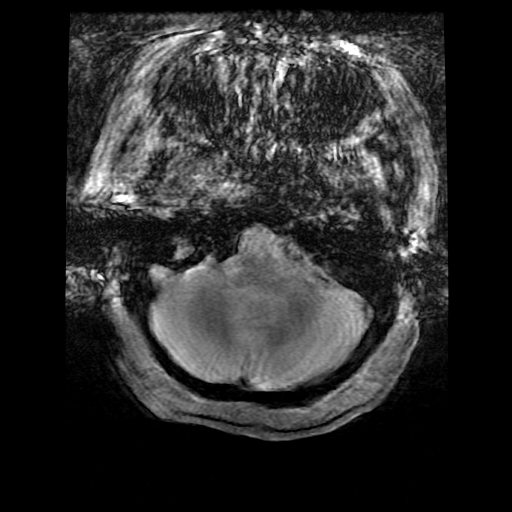
[im 9/21]
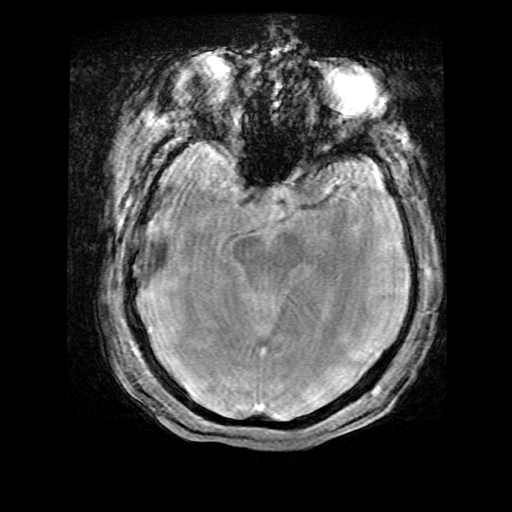
[im 13/21]
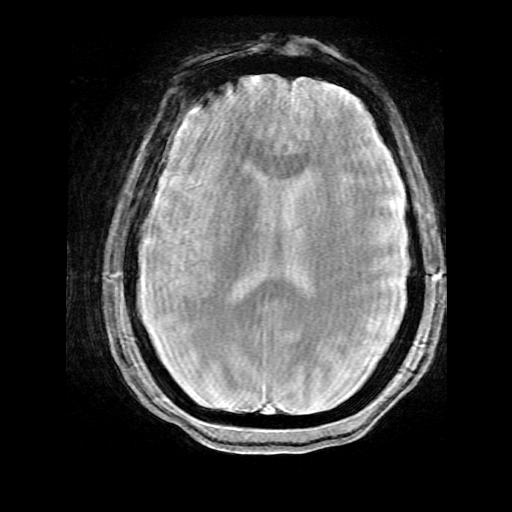
[im 17/21]
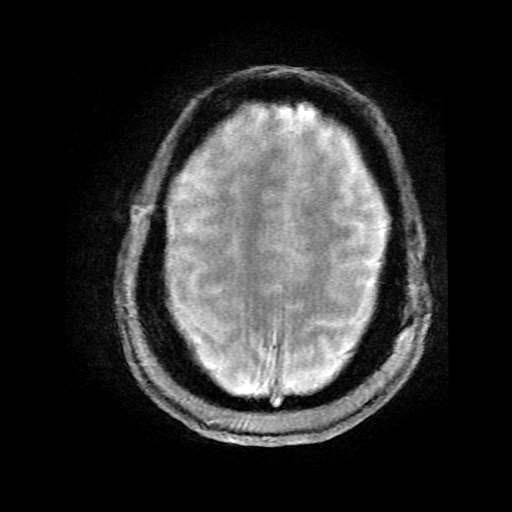
[im 21/21]
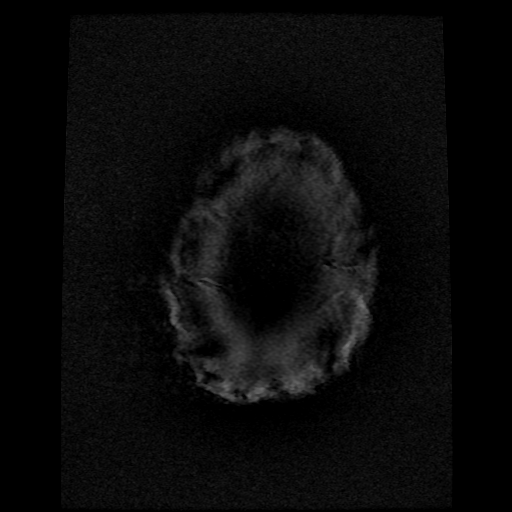

[Series 300: DWI · axial · 3.0mm · 1.09mm/px · z∈[-95,+36]mm · 14 of 45 slices shown (2 of 2)]
[im 1/45]
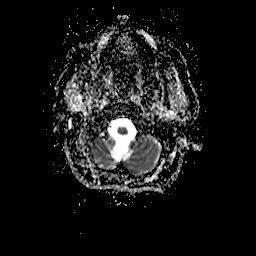
[im 4/45]
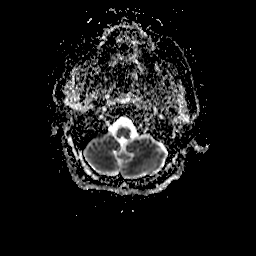
[im 7/45]
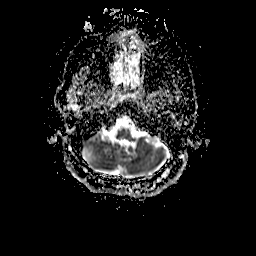
[im 11/45]
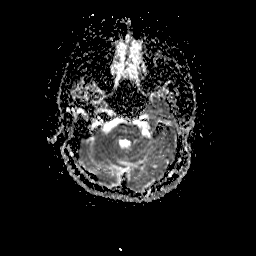
[im 14/45]
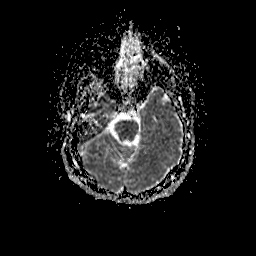
[im 17/45]
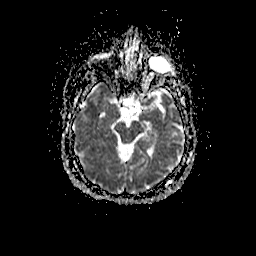
[im 21/45]
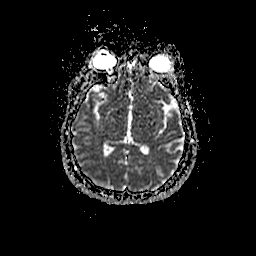
[im 24/45]
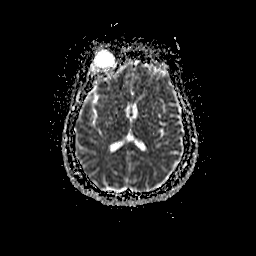
[im 28/45]
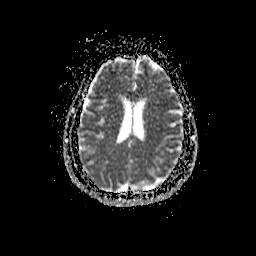
[im 31/45]
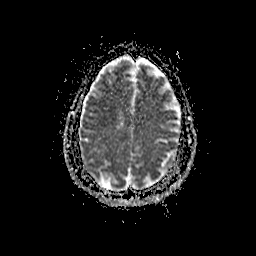
[im 34/45]
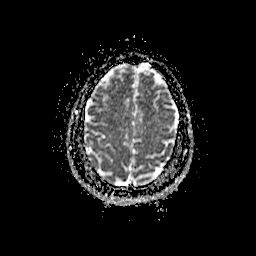
[im 38/45]
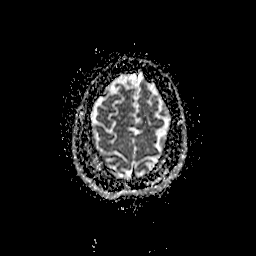
[im 41/45]
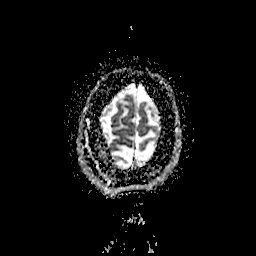
[im 45/45]
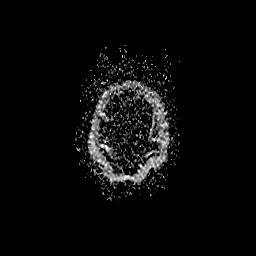

[48 of 48 positions shown; findings below may reference images not displayed]

FINDINGS: No reduced diffusion to suggest acute ischemia though axial
diffusion weighted imaging is mildly motion degraded. No
hydrocephalus, midline shift, mass effect. No susceptibility
artifact to suggest hemorrhage. No abnormal extra-axial fluid
collections.
IMPRESSION: Limited 2 sequence MRI of the brain without acute ischemia or
hemorrhage.

## 2017-03-24 ENCOUNTER — Encounter (HOSPITAL_COMMUNITY): Payer: Self-pay

## 2017-03-29 ENCOUNTER — Encounter (HOSPITAL_COMMUNITY): Payer: Self-pay

## 2017-03-29 ENCOUNTER — Ambulatory Visit (HOSPITAL_COMMUNITY): Payer: Medicare Other

## 2017-03-29 DIAGNOSIS — R29898 Other symptoms and signs involving the musculoskeletal system: Secondary | ICD-10-CM

## 2017-03-29 DIAGNOSIS — M25611 Stiffness of right shoulder, not elsewhere classified: Secondary | ICD-10-CM

## 2017-03-29 DIAGNOSIS — M25511 Pain in right shoulder: Secondary | ICD-10-CM

## 2017-03-29 NOTE — Therapy (Signed)
[facility] [place], [place], [address] Phone: [phone]   Fax:  [phone]  Occupational Therapy Treatment  Patient Details  Name: Katrina [name] MRN: [id number] Date of Birth: 21-Apr-1961 Referring Provider: Dr. Tania Ade  Encounter Date: 03/29/2017      OT End of Session - 03/29/17 0935    Visit Number 4   Number of Visits 16   Date for OT Re-Evaluation 04/18/17   Authorization Type 1) Medicare   Authorization Time Period before 10th visit   Authorization - Visit Number 4   Authorization - Number of Visits 10   OT Start Time 0905   OT Stop Time [phone]   OT Time Calculation (min) 33 min   Activity Tolerance Patient limited by pain   Behavior During Therapy [facility] for tasks assessed/performed      Past Medical History:  Diagnosis Date  . Allergy   . Anemia 1975-1976   . Anginal pain ([place])   . Anxiety    takes Valium daily as needed  . Arthritis    "spine" (12/03/2016)  . Asthma    has inhalers but doesn't use (12/03/2016)  . CAD (coronary artery disease)    Moderate nonobstructive 2012-2013, [place]; No angiographic evidence of CAD 01/31/15 [facility]  . Chronic bronchitis ([place])    "get it a couple times q yr" (12/03/2016)  . Chronic kidney disease   . Chronic lower back pain    budlging disc   . Claustrophobia   . COPD (chronic obstructive pulmonary disease) ([place])   . Daily headache   . Depression   . Diverticulitis   . Family history of adverse reaction to anesthesia    2 daughters gets extremely sick   . GERD (gastroesophageal reflux disease)    takes Dexilant daily  . Heart murmur   . History of blood transfusion 1975-1976 "several"   no abnormal reaction noted  . History of colitis   . History of colon polyps    benign  . History of gastric ulcer   . History of hiatal hernia    "small one"  . History of MRSA infection 2017  . Hyperlipidemia    was on meds but has been off over a yr  .  Hypertension    takes Amlodipine and Maxzide daily  . Insomnia    takes Ambien nightly  . Iron deficiency anemia    "when I was young"  . Lung nodules   . Microscopic hematuria   . Migraine    "2-3/wk" (12/03/2016)  . MS (multiple sclerosis) ([place])    questionable per pt  . Myocardial infarction ([place]) 2000   "mild one"  . Osteoporosis   . Peripheral neuropathy    weakness,numbness,and tingling. Takes Gabapentin daily  . Pneumonia 06/2015  . PTSD (post-traumatic stress disorder) dx'd 2016/2017   "was dx'd w/bipolar in 1991; replaced w/PTSD dx 2016/2017" (12/03/2016)  . Restless leg syndrome    takes Requip daily  . 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 Type II diabetes mellitus ([place])    "went off insulin in 2012/2013" (12/03/2016)    Past Surgical History:  Procedure Laterality Date  . ABDOMINAL AORTOGRAM N/A 12/03/2016   Procedure: Abdominal Aortogram;  Surgeon: Angelia Mould, MD;  Location: [facility];  Service: Cardiovascular;  Laterality: N/A;  . ABDOMINAL HYSTERECTOMY  12/1987  . ADENOIDECTOMY  [place] Bilateral 1993; 1995 X2   "stabilzation done; 1 on the right; 2 on the left"  . APPENDECTOMY  1989  . BACK SURGERY    . BREAST LUMPECTOMY Left    "benign tumor"  . CARDIAC CATHETERIZATION N/A 01/31/2015   Procedure: Left Heart Cath and Coronary Angiography;  Surgeon: Burnell Blanks, MD;  Location: [facility];  Service: Cardiovascular;  Laterality: N/A;  . CARDIAC CATHETERIZATION  2009; 2011  . COLONOSCOPY    . DILATION AND CURETTAGE OF UTERUS    . ESOPHAGOGASTRODUODENOSCOPY    . LAPAROSCOPIC CHOLECYSTECTOMY  1996  . NASAL RECONSTRUCTION  1976  . NASAL SINUS SURGERY  1975  . POSTERIOR LUMBAR FUSION  2005  . RADIOLOGY WITH ANESTHESIA N/A 01/15/2016   Procedure: MRI LUMBAR SPINE WITHOUT;  Surgeon: Medication Radiologist, MD;  Location: [facility];   Service: Radiology;  Laterality: N/A;  . RADIOLOGY WITH ANESTHESIA N/A 06/29/2016   Procedure: MRI OF THE BRAIN WITH AND WITHOUT;  Surgeon: Medication Radiologist, MD;  Location: [facility];  Service: Radiology;  Laterality: N/A;  . RENAL ANGIOGRAPHY N/A 12/03/2016   Procedure: Renal Angiography;  Surgeon: Angelia Mould, MD;  Location: [facility];  Service: Cardiovascular;  Laterality: N/A;  . TONSILLECTOMY  1992  . TUBAL LIGATION    . TUMOR EXCISION  1998   from back of skull; developed;  MRSA from the area that had to be packed    There were no vitals filed for this visit.      Subjective Assessment - 03/29/17 0934    Subjective  S: It's extremely painful to move it.    Currently in Pain? Yes   Pain Score 6    Pain Location Shoulder   Pain Orientation Right   Pain Descriptors / Indicators Constant;Burning   Pain Type Acute pain   Pain Radiating Towards Up neck   Pain Onset More than a month ago   Pain Frequency Constant   Aggravating Factors  movement   Pain Relieving Factors nothing    Effect of Pain on Daily Activities severe effect. Unable to use RUE for daily tasks.   Multiple Pain Sites No            [facility] OT Assessment - 03/29/17 0925      Assessment   Diagnosis right shoulder arthroscopy, debridement, SAD, DCE     Precautions   Precautions Shoulder   Type of Shoulder Precautions Progress as tolerated.                   OT Treatments/Exercises (OP) - 03/29/17 0925      Exercises   Exercises Shoulder     Shoulder Exercises: Supine   Protraction PROM;10 reps   Horizontal ABduction PROM;10 reps   External Rotation PROM;10 reps   Internal Rotation PROM;10 reps   Flexion PROM;10 reps   ABduction PROM;10 reps     Shoulder Exercises: Seated   Extension AROM;10 reps   Row AROM;10 reps     Shoulder Exercises: Therapy Ball   Flexion 10 reps;Limitations   Flexion Limitations standing. medium green ball on mat table due to back issues.      Shoulder Exercises: Isometric Strengthening   Flexion Supine  3x10"   Extension Supine  3x10"   External Rotation Supine  3x10"   Internal Rotation Supine  3x10"   ABduction Supine  3x10"   ADduction Supine  3x10" extreme pain  Manual Therapy   Manual Therapy Myofascial release   Manual therapy comments performed separately from all interventions   Myofascial Release Myofascial release and manual stretching completed to right upper arm, trapezius, and scapularis region to decrease fascial restrictions and increase joint mobility in a pain free zone.                   OT Short Term Goals - 03/15/17 1149      OT SHORT TERM GOAL #1   Title Patient will be educated and independent with HEP to increase functional performance during daily tasks while using RUE.    Time 4   Period Weeks   Status On-going     OT SHORT TERM GOAL #2   Title Patient will increase P/ROM to [facility] to increase ability to complete dressing tasks with less difficulty.    Time 4   Period Weeks   Status On-going     OT SHORT TERM GOAL #3   Title Patient will increase RUE strength to 3/5 to increase ability to complete activities at waist level or above with more comfort.    Time 4   Period Weeks   Status On-going     OT SHORT TERM GOAL #4   Title Patient will decrease fascial restrictions to moderate amount to increase functional mobility of RUE needed during daily tasks.   Time 4   Period Weeks   Status On-going     OT SHORT TERM GOAL #5   Title Patient will decrease pain level to 5/10 or less during functional use of RUE.    Time 4   Period Weeks   Status On-going           OT Long Term Goals - 03/15/17 1150      OT LONG TERM GOAL #1   Title Patient will return to highest level of independence using RUE as dominant extremity for all tasks.    Time 8   Period Weeks   Status On-going     OT LONG TERM GOAL #2   Title Patient will increase RUE strength to 4/5 to increase  ability to complete normal household tasks with less difficulty.    Time 8   Period Weeks   Status On-going     OT LONG TERM GOAL #3   Title Patient will increase A/ROM of RUE to WNL to increase ability to complete reaching tasks overhead.    Time 8   Period Weeks   Status On-going     OT LONG TERM GOAL #4   Title Patient will decrease pain level during functional tasks in RUE to 3/10 or less.    Time 8   Period Weeks   Status On-going     OT LONG TERM GOAL #5   Title Patient will decrease fascial restrictions in RUE to min amount or less to increase functional mobility needed to complete tasks.    Time 8   Period Weeks   Status On-going               Plan - 03/29/17 0940    Clinical Impression Statement A: Pt presents today with continued increase pain and reports that it is constant. No relief is achieved with ice or other modalities. Treatment was focused on pain management and not elevating pain level. Encouraged patient to contact MD to schedule a MRI as pain is not any better since her fall and she is unable to participate fully in therapy.  Plan P: Follow up on scheduling MRI. Continue to work on decreasing pain level and maintaining functional ROM. Progress as able to tolerate.    Consulted and Agree with Plan of Care Patient      Patient will benefit from skilled therapeutic intervention in order to improve the following deficits and impairments:  Decreased strength, Decreased range of motion, Pain, Increased edema, Increased fascial restricitons, Impaired UE functional use  Visit Diagnosis: Other symptoms and signs involving the musculoskeletal system  Acute pain of right shoulder  Stiffness of right shoulder, not elsewhere classified    Problem List Patient Active Problem List   Diagnosis Date Noted  . Hypertension 12/03/2016  . Adverse food reaction 10/14/2016  . Pulmonary nodules 10/14/2016  . Non-seasonal allergic rhinitis due to fungal spores  10/14/2016  . Cigarette smoker 09/24/2016  . Cerebral microvascular disease 08/19/2016  . Non compliance with medical treatment 05/11/2016  . Leg pain, anterior, right 05/11/2016  . Syncope 05/02/2016  . Type 2 diabetes mellitus with diabetic neuropathy ([place]) 05/02/2016  . Restless leg syndrome 05/02/2016  . Cerebrovascular accident (CVA) ([place])   . Vascular headache   . History of chest pain   . Generalized anxiety disorder   . Chronic bilateral low back pain without sciatica   . Peripheral neuropathy   . Migraine without status migrainosus, not intractable   . PTSD (post-traumatic stress disorder)   . History of cerebrovascular accident (CVA) with residual deficit   . CVA (cerebral vascular accident) ([place]) 04/03/2016  . Stroke (cerebrum) ([place]) 04/03/2016  . Post traumatic stress disorder 10/06/2015  . Precordial pain   . Migraine syndrome 01/24/2015  . Migraine 01/24/2015  . TIA (transient ischemic attack) 12/25/2014  . Hypertensive urgency   . Other chest pain   . Malignant hypertension   . Hypokalemia 04/16/2014  . Muscle weakness (generalized) 04/05/2014  . Stiffness of left hip joint 04/05/2014  . Pain in left hip 04/05/2014  . Diastolic dysfunction 11/28/7626  . Normal coronary arteries 03/20/2014  . PUD (peptic ulcer disease) 03/20/2014  . Type 2 diabetes, uncontrolled, with neuropathy ([place]) 03/20/2014  . Morbid obesity due to excess calories ([place]) 03/20/2014  . Left-sided weakness 03/20/2014  . Paresthesias 03/20/2014  . Cerebrovascular disease 03/20/2014  . Dyspnea on exertion 03/05/2012  . Tobacco abuse 03/05/2012  . Moderate COPD (chronic obstructive pulmonary disease) ([place]) 11/23/2011  . GERD (gastroesophageal reflux disease) 11/23/2011  . Uncontrolled hypertension 11/23/2011   Ailene Ravel, OTR/L,CBIS  [phone]  03/29/2017, 9:43 AM  [facility] [address] [place], [place], [address] Phone:  [phone]   Fax:  [phone]  Name: Katrina [name] MRN: [id number] Date of Birth: June 16, 1960

## 2017-03-31 ENCOUNTER — Telehealth (HOSPITAL_COMMUNITY): Payer: Self-pay | Admitting: Nurse Practitioner

## 2017-03-31 ENCOUNTER — Ambulatory Visit (HOSPITAL_COMMUNITY): Payer: Medicare Other

## 2017-03-31 ENCOUNTER — Ambulatory Visit: Payer: Self-pay | Admitting: Family Medicine

## 2017-03-31 NOTE — Telephone Encounter (Signed)
03/31/17  cx via the phone tree

## 2017-04-05 ENCOUNTER — Ambulatory Visit (HOSPITAL_COMMUNITY): Payer: Medicare Other | Admitting: Occupational Therapy

## 2017-04-07 ENCOUNTER — Ambulatory Visit (HOSPITAL_COMMUNITY): Payer: Medicare Other | Attending: Orthopedic Surgery | Admitting: Occupational Therapy

## 2017-04-07 ENCOUNTER — Telehealth (HOSPITAL_COMMUNITY): Payer: Self-pay | Admitting: Occupational Therapy

## 2017-04-07 NOTE — Telephone Encounter (Signed)
Pt would like to cancel all appts as she is out of town with her sister who is very sick. Pt would like to hold therapy until she has returned and will call to reschedule.    Katrina [name], OTR/L  [phone] 04/07/2017

## 2017-04-11 ENCOUNTER — Encounter (HOSPITAL_COMMUNITY): Payer: Self-pay

## 2017-04-17 ENCOUNTER — Emergency Department (HOSPITAL_COMMUNITY): Payer: Medicare Other

## 2017-04-17 ENCOUNTER — Emergency Department (HOSPITAL_COMMUNITY)
Admission: EM | Admit: 2017-04-17 | Discharge: 2017-04-17 | Disposition: A | Discharge status: Home / Self Care | Payer: Medicare Other | Attending: Emergency Medicine | Admitting: Emergency Medicine

## 2017-04-17 ENCOUNTER — Encounter (HOSPITAL_COMMUNITY): Payer: Self-pay | Admitting: *Deleted

## 2017-04-17 ENCOUNTER — Other Ambulatory Visit: Payer: Self-pay

## 2017-04-17 DIAGNOSIS — R05 Cough: Secondary | ICD-10-CM | POA: Diagnosis present

## 2017-04-17 DIAGNOSIS — Z5321 Procedure and treatment not carried out due to patient leaving prior to being seen by health care provider: Secondary | ICD-10-CM | POA: Insufficient documentation

## 2017-04-17 NOTE — ED Notes (Signed)
Rounding  Informed of wait Thanked for patience assured will be  Seen as soon as possible

## 2017-04-17 NOTE — ED Notes (Signed)
No answer in waiting room 

## 2017-04-17 NOTE — ED Triage Notes (Signed)
Pt c/o cough, congestion that started two weeks ago, also c/o chest pain that is worse with coughing as well

## 2017-04-24 ENCOUNTER — Other Ambulatory Visit: Payer: Self-pay

## 2017-04-24 ENCOUNTER — Emergency Department (HOSPITAL_COMMUNITY)
Admission: EM | Admit: 2017-04-24 | Discharge: 2017-04-25 | Disposition: A | Discharge status: Home / Self Care | Payer: Medicare Other | Attending: Emergency Medicine | Admitting: Emergency Medicine

## 2017-04-24 ENCOUNTER — Encounter (HOSPITAL_COMMUNITY): Payer: Self-pay | Admitting: *Deleted

## 2017-04-24 DIAGNOSIS — E1140 Type 2 diabetes mellitus with diabetic neuropathy, unspecified: Secondary | ICD-10-CM | POA: Insufficient documentation

## 2017-04-24 DIAGNOSIS — J45909 Unspecified asthma, uncomplicated: Secondary | ICD-10-CM | POA: Diagnosis not present

## 2017-04-24 DIAGNOSIS — I252 Old myocardial infarction: Secondary | ICD-10-CM | POA: Diagnosis not present

## 2017-04-24 DIAGNOSIS — R51 Headache: Secondary | ICD-10-CM | POA: Diagnosis present

## 2017-04-24 DIAGNOSIS — N189 Chronic kidney disease, unspecified: Secondary | ICD-10-CM | POA: Insufficient documentation

## 2017-04-24 DIAGNOSIS — Y9301 Activity, walking, marching and hiking: Secondary | ICD-10-CM | POA: Insufficient documentation

## 2017-04-24 DIAGNOSIS — Z7902 Long term (current) use of antithrombotics/antiplatelets: Secondary | ICD-10-CM | POA: Diagnosis not present

## 2017-04-24 DIAGNOSIS — M7918 Myalgia, other site: Secondary | ICD-10-CM | POA: Diagnosis not present

## 2017-04-24 DIAGNOSIS — F17210 Nicotine dependence, cigarettes, uncomplicated: Secondary | ICD-10-CM | POA: Diagnosis not present

## 2017-04-24 DIAGNOSIS — W108XXA Fall (on) (from) other stairs and steps, initial encounter: Secondary | ICD-10-CM | POA: Diagnosis not present

## 2017-04-24 DIAGNOSIS — Z79899 Other long term (current) drug therapy: Secondary | ICD-10-CM | POA: Insufficient documentation

## 2017-04-24 DIAGNOSIS — I129 Hypertensive chronic kidney disease with stage 1 through stage 4 chronic kidney disease, or unspecified chronic kidney disease: Secondary | ICD-10-CM | POA: Diagnosis not present

## 2017-04-24 DIAGNOSIS — I2510 Atherosclerotic heart disease of native coronary artery without angina pectoris: Secondary | ICD-10-CM | POA: Diagnosis not present

## 2017-04-24 DIAGNOSIS — Z8673 Personal history of transient ischemic attack (TIA), and cerebral infarction without residual deficits: Secondary | ICD-10-CM | POA: Insufficient documentation

## 2017-04-24 DIAGNOSIS — Y929 Unspecified place or not applicable: Secondary | ICD-10-CM | POA: Diagnosis not present

## 2017-04-24 DIAGNOSIS — Y998 Other external cause status: Secondary | ICD-10-CM | POA: Diagnosis not present

## 2017-04-24 DIAGNOSIS — J449 Chronic obstructive pulmonary disease, unspecified: Secondary | ICD-10-CM | POA: Diagnosis not present

## 2017-04-24 DIAGNOSIS — W19XXXA Unspecified fall, initial encounter: Secondary | ICD-10-CM

## 2017-04-24 NOTE — ED Triage Notes (Signed)
Pt brought in by [facility] for c/o fall; pt is c/o right arm, knee and back pain

## 2017-04-25 ENCOUNTER — Emergency Department (HOSPITAL_COMMUNITY): Payer: Medicare Other

## 2017-04-25 DIAGNOSIS — M7918 Myalgia, other site: Secondary | ICD-10-CM | POA: Diagnosis not present

## 2017-04-25 MED ORDER — HYDROCODONE-ACETAMINOPHEN 5-325 MG PO TABS
2.0000 | ORAL_TABLET | Freq: Once | ORAL | Status: AC
  Administered 2017-04-25: 2 via ORAL
  Filled 2017-04-25: qty 2

## 2017-04-25 MED ORDER — PROMETHAZINE HCL 12.5 MG PO TABS
12.5000 mg | ORAL_TABLET | Freq: Once | ORAL | Status: AC
  Administered 2017-04-25: 12.5 mg via ORAL
  Filled 2017-04-25: qty 1

## 2017-04-25 NOTE — ED Provider Notes (Signed)
[facility] EMERGENCY DEPARTMENT Provider Note   CSN: [id number] Arrival date & time: 04/24/17  2338     History   Chief Complaint Chief Complaint  Patient presents with  . Fall    HPI Katrina [name] is a 56 y.o. female.  Patient is a 56 year old female who presents to the emergency department by EMS following a fall.  The patient states that she fell down about 5 or 6 steps approximately an hour prior to her arrival here in the emergency department.  She denies any loss of consciousness, but states she has a headache and is fearful that she may have hit her head.  It is of note that the patient is currently on 75 mg of Plavix daily.  Patient also complains of neck pain that extends into the shoulders.  This pain is getting progressively worse as time passes by.  The patient complains of lower back area pain.  She complains of right shoulder pain, right hip pain, and right knee pain.  The patient states she is not been able to walk since the fall and thus called EMS to assist her in getting to the emergency room.  She has not taken anything for her injuries up to this point.      Past Medical History:  Diagnosis Date  . Allergy   . Anemia 1975-1976   . Anginal pain ([place])   . Anxiety    takes Valium daily as needed  . Arthritis    "spine" (12/03/2016)  . Asthma    has inhalers but doesn't use (12/03/2016)  . CAD (coronary artery disease)    Moderate nonobstructive 2012-2013, [place]; No angiographic evidence of CAD 01/31/15 [facility]  . Chronic bronchitis ([place])    "get it a couple times q yr" (12/03/2016)  . Chronic kidney disease   . Chronic lower back pain    budlging disc   . Claustrophobia   . COPD (chronic obstructive pulmonary disease) ([place])   . Daily headache   . Depression   . Diverticulitis   . Family history of adverse reaction to anesthesia    2 daughters gets extremely sick   . GERD (gastroesophageal reflux disease)    takes Dexilant daily  . Heart murmur     . History of blood transfusion 1975-1976 "several"   no abnormal reaction noted  . History of colitis   . History of colon polyps    benign  . History of gastric ulcer   . History of hiatal hernia    "small one"  . History of MRSA infection 2017  . Hyperlipidemia    was on meds but has been off over a yr  . Hypertension    takes Amlodipine and Maxzide daily  . Insomnia    takes Ambien nightly  . Iron deficiency anemia    "when I was young"  . Lung nodules   . Microscopic hematuria   . Migraine    "2-3/wk" (12/03/2016)  . MS (multiple sclerosis) ([place])    questionable per pt  . Myocardial infarction ([place]) 2000   "mild one"  . Osteoporosis   . Peripheral neuropathy    weakness,numbness,and tingling. Takes Gabapentin daily  . Pneumonia 06/2015  . PTSD (post-traumatic stress disorder) dx'd 2016/2017   "was dx'd w/bipolar in 1991; replaced w/PTSD dx 2016/2017" (12/03/2016)  . Restless leg syndrome    takes Requip daily  . 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 Type II diabetes mellitus ([place])    "went off insulin in 2012/2013" (12/03/2016)    Patient Active Problem List   Diagnosis Date Noted  . Hypertension 12/03/2016  . Adverse food reaction 10/14/2016  . Pulmonary nodules 10/14/2016  . Non-seasonal allergic rhinitis due to fungal spores 10/14/2016  . Cigarette smoker 09/24/2016  . Cerebral microvascular disease 08/19/2016  . Non compliance with medical treatment 05/11/2016  . Leg pain, anterior, right 05/11/2016  . Syncope 05/02/2016  . Type 2 diabetes mellitus with diabetic neuropathy ([place]) 05/02/2016  . Restless leg syndrome 05/02/2016  . Cerebrovascular accident (CVA) ([place])   . Vascular headache   . History of chest pain   . Generalized anxiety disorder   . Chronic bilateral low back pain without sciatica   . Peripheral neuropathy   . Migraine without status migrainosus, not  intractable   . PTSD (post-traumatic stress disorder)   . History of cerebrovascular accident (CVA) with residual deficit   . CVA (cerebral vascular accident) ([place]) 04/03/2016  . Stroke (cerebrum) ([place]) 04/03/2016  . Post traumatic stress disorder 10/06/2015  . Precordial pain   . Migraine syndrome 01/24/2015  . Migraine 01/24/2015  . TIA (transient ischemic attack) 12/25/2014  . Hypertensive urgency   . Other chest pain   . Malignant hypertension   . Hypokalemia 04/16/2014  . Muscle weakness (generalized) 04/05/2014  . Stiffness of left hip joint 04/05/2014  . Pain in left hip 04/05/2014  . Diastolic dysfunction 43/15/4008  . Normal coronary arteries 03/20/2014  . PUD (peptic ulcer disease) 03/20/2014  . Type 2 diabetes, uncontrolled, with neuropathy ([place]) 03/20/2014  . Morbid obesity due to excess calories ([place]) 03/20/2014  . Left-sided weakness 03/20/2014  . Paresthesias 03/20/2014  . Cerebrovascular disease 03/20/2014  . Dyspnea on exertion 03/05/2012  . Tobacco abuse 03/05/2012  . Moderate COPD (chronic obstructive pulmonary disease) ([place]) 11/23/2011  . GERD (gastroesophageal reflux disease) 11/23/2011  . Uncontrolled hypertension 11/23/2011    Past Surgical History:  Procedure Laterality Date  . ABDOMINAL ANGIOGRAM WITH RENAL ANGIOGRAM NOT SELECTIVE Right 12/06/2016   Performed by Elam Dutch, MD at [place]  . Abdominal Aortogram N/A 12/03/2016   Performed by Angelia Mould, MD at [place] [facility]  . ABDOMINAL HYSTERECTOMY  12/1987  . ADENOIDECTOMY  1975  . ANKLE SURGERY Bilateral 1993; 1995 X2   "stabilzation done; 1 on the right; 2 on the left"  . APPENDECTOMY  1989  . BACK SURGERY    . BREAST LUMPECTOMY Left    "benign tumor"  . CARDIAC CATHETERIZATION  2009; 2011  . COLONOSCOPY    . DILATION AND CURETTAGE OF UTERUS    . ESOPHAGOGASTRODUODENOSCOPY    . LAPAROSCOPIC CHOLECYSTECTOMY  1996  . Left Heart Cath and Coronary Angiography N/A 01/31/2015    Performed by Burnell Blanks, MD at [place] [facility]  . MRI LUMBAR SPINE WITHOUT N/A 01/15/2016   Performed by Radiologist, Medication, MD at [place]  . MRI OF THE BRAIN WITH AND WITHOUT N/A 06/29/2016   Performed by Radiologist, Medication, MD at [place]  . NASAL RECONSTRUCTION  1976  . NASAL SINUS SURGERY  1975  . POSTERIOR LUMBAR FUSION  2005  . Renal Angiography N/A 12/03/2016   Performed by Angelia Mould, MD at [place] [facility]  . TONSILLECTOMY  1992  . TUBAL LIGATION    . TUMOR EXCISION  1998   from back of  skull; developed;  MRSA from the area that had to be packed    OB History    No data available       Home Medications    Prior to Admission medications   Medication Sig Start Date End Date Taking? Authorizing Provider  amLODipine (NORVASC) 10 MG tablet Take 1 tablet (10 mg total) by mouth daily. 06/05/14   Herminio Commons, MD  aspirin-acetaminophen-caffeine (EXCEDRIN MIGRAINE) [phone] MG tablet Take 2 tablets by mouth daily as needed for migraine.     [provider]  chlorthalidone (HYGROTON) 25 MG tablet Take 25 mg by mouth 2 (two) times daily.     [provider]  clopidogrel (PLAVIX) 75 MG tablet Take 75 mg by mouth daily. PATIENT STATES SHES NOT TAKING    [provider]  dexlansoprazole (DEXILANT) 60 MG capsule Take 1 tab by mouth every morning. 10/18/16   Esterwood, Amy S, PA-C  diazepam (VALIUM) 5 MG tablet Take 1 tablet (5 mg total) by mouth every 8 (eight) hours as needed for anxiety. 05/04/16   Orvan Falconer, MD  gabapentin (NEURONTIN) 600 MG tablet Take 600 mg by mouth 4 (four) times daily.    [provider]  hydrALAZINE (APRESOLINE) 25 MG tablet Take 1 tablet (25 mg total) by mouth 3 (three) times daily. 02/21/17 05/22/17  Herminio Commons, MD  losartan (COZAAR) 50 MG tablet Take 50 mg by mouth 2 (two) times daily.  06/09/16   [provider]  rOPINIRole (REQUIP) 4 MG tablet Take 4 mg by mouth at  bedtime.    [provider]  spironolactone (ALDACTONE) 100 MG tablet Take 100 mg by mouth daily.    [provider]  zolpidem (AMBIEN) 10 MG tablet Take 10 mg by mouth at bedtime.  03/25/16   [provider]    Family History Family History  Problem Relation Age of Onset  . Coronary artery disease Father   . Emphysema Father   . Heart attack Father   . Stroke Father   . Cancer Father        Unsure of type   . Allergic rhinitis Father   . Depression Mother   . Cancer Mother        skin cancer  . Bipolar disorder Brother   . Drug abuse Brother   . Cancer Brother        leukemia  . Bipolar disorder Daughter   . Allergic rhinitis Daughter   . Asthma Daughter   . Urticaria Daughter   . Diabetes Maternal Grandmother   . Stomach cancer Maternal Grandfather   . Cancer Maternal Grandfather        stomach   . Angioedema Neg Hx   . Atopy Neg Hx   . Eczema Neg Hx   . Immunodeficiency Neg Hx     Social History Social History   Tobacco Use  . Smoking status: Current Every Day Smoker    Packs/day: 2.00    Years: 45.00    Pack years: 90.00    Types: Cigarettes    Start date: 03/04/1971  . Smokeless tobacco: Never Used  Substance Use Topics  . Alcohol use: No    Alcohol/week: 0.0 oz  . Drug use: No     Allergies   Other; Penicillins; Zithromax [azithromycin]; Aspirin; Pineapple; Strawberry extract; Wheat bran; Yeast-related products; Aspartame and phenylalanine; Mushroom extract complex; and Nicardipine   Review of Systems Review of Systems  Constitutional: Negative for activity change.  All ROS Neg except as noted in HPI  HENT: Negative for nosebleeds.   Eyes: Negative for photophobia and discharge.  Respiratory: Negative for cough, shortness of breath and wheezing.   Cardiovascular: Negative for chest pain and palpitations.  Gastrointestinal: Negative for abdominal pain and blood in stool.  Genitourinary: Negative for dysuria,  frequency and hematuria.  Musculoskeletal: Positive for arthralgias, back pain and neck pain.  Skin: Negative.   Neurological: Positive for headaches. Negative for dizziness, seizures and speech difficulty.  Psychiatric/Behavioral: Negative for confusion and hallucinations.     Physical Exam Updated Vital Signs BP (!) 191/89 (BP Location: Left Arm)   Pulse (!) 111   Temp 98.4 F (36.9 C) (Oral)   Resp (!) 22   Ht 5\' 6"  (1.676 m)   Wt 96.6 kg (213 lb)   SpO2 95%   BMI 34.38 kg/m   Physical Exam  Constitutional: She is oriented to person, place, and time. She appears well-developed and well-nourished.  Non-toxic appearance.  HENT:  Head: Normocephalic.  Right Ear: Tympanic membrane and external ear normal.  Left Ear: Tympanic membrane and external ear normal.  No palpable hematoma, but there are areas that are sore to touch of the scalp.  No facial swelling or bruising.  No evidence of trauma to the tongue or mucosa.  Eyes: EOM and lids are normal. Pupils are equal, round, and reactive to light.  Neck: Normal range of motion. Neck supple. Carotid bruit is not present.  Cardiovascular: Regular rhythm, normal heart sounds, intact distal pulses and normal pulses. Tachycardia present.  Pulmonary/Chest: Breath sounds normal. No respiratory distress.  There is symmetrical rise and fall of the chest.  The patient speaks in complete sentences without problem.  Abdominal: Soft. Bowel sounds are normal. There is no tenderness. There is no guarding.  Musculoskeletal: Normal range of motion.  There is pain to palpation of the cervical spine area and the paraspinal area.  There is pain to palpation of the anterior and posterior right shoulder.  There is no palpable deformity.  There is no deformity noted of the clavicle.  There is pain to palpation and attempted range of motion of the right hip.  There is no shortening of the right or left lower extremity.  There is pain to palpation of the  right knee.  There is no effusion appreciated.  There is pain of the posterior right knee.  The dorsalis pedis pulses are 2+ bilaterally.  The capillary refill is less than 2 seconds bilaterally.  Lymphadenopathy:       Head (right side): No submandibular adenopathy present.       Head (left side): No submandibular adenopathy present.    She has no cervical adenopathy.  Neurological: She is alert and oriented to person, place, and time. She has normal strength. No cranial nerve deficit or sensory deficit.  Skin: Skin is warm and dry.  Psychiatric: She has a normal mood and affect. Her speech is normal.  Nursing note and vitals reviewed.    ED Treatments / Results  Labs (all labs ordered are listed, but only abnormal results are displayed) Labs Reviewed - No data to display  EKG  EKG Interpretation None       Radiology No results found.  Procedures Procedures (including critical care time)  Medications Ordered in ED Medications  HYDROcodone-acetaminophen (NORCO/VICODIN) 5-325 MG per tablet 2 tablet (not administered)  promethazine (PHENERGAN) tablet 12.5 mg (not administered)     Initial Impression / Assessment and  Plan / ED Course  I have reviewed the triage vital signs and the nursing notes.  Pertinent labs & imaging results that were available during my care of the patient were reviewed by me and considered in my medical decision making (see chart for details).       Final Clinical Impressions(s) / ED Diagnoses mdm Patient sustained a fall at home involving several steps.  The patient is unsure if she hit her head, but states she has a headache, and has some soreness of the scalp on examination.  She has multiple complaints of pain in multiple sites.  Vital signs reveal elevated heart rate of 111, elevated blood pressure 191/89.  Pulse oximetry is 95% on room air.  The patient will obtain CT scans of the head and neck.  X-rays of the right shoulder and knee, as well  as the lower back.  Patient given hydrocodone and promethazine in the emergency department to assist with her pain.  Patient's care will be continued by Dr.Liu.   Final diagnoses:  Fall, initial encounter  Musculoskeletal pain    ED Discharge Orders    None       Lily Kocher, PA-C 04/26/17 [place], [place], MD 04/29/17 [phone]

## 2017-04-25 NOTE — ED Provider Notes (Signed)
Medical screening examination/treatment/procedure(s) were conducted as a shared visit with non-physician practitioner(s) and myself.  I personally evaluated the patient during the encounter.   EKG Interpretation None      56 year old female who presents after mechanical fall.  She takes Plavix.  States that she got tangled up in her coat, and fell forward.  Unsure if she hit her head but does complain of headache, right shoulder, knee, and low back pain.  She is well-appearing in no acute distress.  Mental status is baseline.  She has no focal neurological deficits.  CT head and cervical spine visualized and shows no acute traumatic injuries.  X-rays visualized and shows no fractures.  I discussed supportive care management for bruising and muscle soreness. Strict return and follow-up instructions reviewed. She expressed understanding of all discharge instructions and felt comfortable with the plan of care.    Forde Dandy, MD 04/25/17 [phone]

## 2017-04-25 NOTE — Discharge Instructions (Signed)
Please follow-up with your PCP for ongoing pain management.  Your CT scan and x-rays does not show serious traumatic please see Tylenol for pain, ice or heat packs as needed.  Return without fail for worsening symptoms, including confusion, difficulty walking, intractable vomiting, escalating pains, or any other symptoms concerning to you.

## 2017-04-26 ENCOUNTER — Other Ambulatory Visit (HOSPITAL_BASED_OUTPATIENT_CLINIC_OR_DEPARTMENT_OTHER): Payer: Self-pay

## 2017-04-26 DIAGNOSIS — G4700 Insomnia, unspecified: Secondary | ICD-10-CM

## 2017-04-26 DIAGNOSIS — G4730 Sleep apnea, unspecified: Secondary | ICD-10-CM

## 2017-04-26 DIAGNOSIS — R0683 Snoring: Secondary | ICD-10-CM

## 2017-04-26 DIAGNOSIS — G2581 Restless legs syndrome: Secondary | ICD-10-CM

## 2017-05-02 ENCOUNTER — Ambulatory Visit (INDEPENDENT_AMBULATORY_CARE_PROVIDER_SITE_OTHER): Payer: Medicare Other | Admitting: Cardiovascular Disease

## 2017-05-02 ENCOUNTER — Encounter: Payer: Self-pay | Admitting: Cardiovascular Disease

## 2017-05-02 VITALS — BP 184/92 | HR 75 | Ht 66.0 in | Wt 214.0 lb

## 2017-05-02 DIAGNOSIS — R079 Chest pain, unspecified: Secondary | ICD-10-CM | POA: Diagnosis not present

## 2017-05-02 DIAGNOSIS — Z8673 Personal history of transient ischemic attack (TIA), and cerebral infarction without residual deficits: Secondary | ICD-10-CM | POA: Diagnosis not present

## 2017-05-02 DIAGNOSIS — I10 Essential (primary) hypertension: Secondary | ICD-10-CM | POA: Diagnosis not present

## 2017-05-02 DIAGNOSIS — G4730 Sleep apnea, unspecified: Secondary | ICD-10-CM | POA: Diagnosis not present

## 2017-05-02 MED ORDER — HYDRALAZINE HCL 50 MG PO TABS: 50.0000 mg | ORAL_TABLET | Freq: Three times a day (TID) | ORAL | 6 refills | Status: DC

## 2017-05-02 MED ORDER — ATENOLOL 50 MG PO TABS: 50.0000 mg | ORAL_TABLET | Freq: Every day | ORAL | 6 refills | Status: DC

## 2017-05-02 MED ORDER — POTASSIUM CHLORIDE CRYS ER 20 MEQ PO TBCR: 20.0000 meq | EXTENDED_RELEASE_TABLET | Freq: Every day | ORAL | 6 refills | Status: DC

## 2017-05-02 MED ORDER — FUROSEMIDE 20 MG PO TABS: 20.0000 mg | ORAL_TABLET | Freq: Every day | ORAL | 6 refills | Status: DC

## 2017-05-02 NOTE — Progress Notes (Signed)
SUBJECTIVE: The patient presents for routine follow-up of malignant hypertension.  She was recently evaluated in the ED for a mechanical fall.  CT of the head and cervical spine showed no acute traumatic injuries.  She was evaluated in the ED for chest pain on 03/20/17.  I personally reviewed all of the documentation, labs, and studies.  Blood pressure was 210/93.  D-dimer and troponins were negative.  She was given IV hydralazine.  She had headaches at the time as well.  She also has a history of stroke and medication noncompliance.  She underwent normal renal angiography on 12/06/16.  Coronary angiography on 01/31/15 showed no angiographic evidence of coronary disease with normal left ventricle systolic function.  She had a sleep study in 2016 which documented restless leg syndrome but no sleep apnea.  Her sister recently passed away of a heart attack at the age of 45.  She also had malignant hypertension.  She has been retaining fluid in her ankles and feet.  Today's blood pressure readings: Right arm 184/92, left arm 197/122.     Review of Systems: As per "subjective", otherwise negative.  Allergies  Allergen Reactions  . Other Anaphylaxis and Swelling    [place]  . Penicillins Anaphylaxis    Has patient had a PCN reaction causing immediate rash, facial/tongue/throat swelling, SOB or lightheadedness with hypotension: Yes Has patient had a PCN reaction causing severe rash involving mucus membranes or skin necrosis: Yes Has patient had a PCN reaction that required hospitalization Yes Has patient had a PCN reaction occurring within the last 10 years: No If all of the above answers are "NO", then may proceed with Cephalosporin use.   . Zithromax [Azithromycin] Anaphylaxis  . Aspirin Other (See Comments)    Due to stomach ulcers.   . Pineapple Rash  . Strawberry Extract Rash and Hives  . Wheat Bran Other (See Comments)    Sneezing, rhinorrhea  . Yeast-Related Products  Other (See Comments)    Sneezing, rhinorrhea  . Aspartame And Phenylalanine Palpitations  . Mushroom Extract Complex Rash  . Nicardipine Nausea And Vomiting and Other (See Comments)    shaking    Current Outpatient Medications  Medication Sig Dispense Refill  . albuterol (PROVENTIL HFA;VENTOLIN HFA) 108 (90 Base) MCG/ACT inhaler Inhale 2 puffs into the lungs every 6 (six) hours as needed.    Marland Kitchen amLODipine (NORVASC) 10 MG tablet Take 1 tablet (10 mg total) by mouth daily. 30 tablet 6  . aspirin-acetaminophen-caffeine (EXCEDRIN MIGRAINE) 250-250-65 MG tablet Take 2 tablets by mouth daily as needed for migraine.     . chlorthalidone (HYGROTON) 25 MG tablet Take 25 mg by mouth 2 (two) times daily.     . clopidogrel (PLAVIX) 75 MG tablet Take 75 mg by mouth daily. PATIENT STATES SHES NOT TAKING    . dexlansoprazole (DEXILANT) 60 MG capsule Take 1 tab by mouth every morning. 30 capsule 1  . gabapentin (NEURONTIN) 600 MG tablet Take 600 mg by mouth 4 (four) times daily.    . hydrALAZINE (APRESOLINE) 25 MG tablet Take 1 tablet (25 mg total) by mouth 3 (three) times daily. 90 tablet 6  . losartan (COZAAR) 50 MG tablet Take 50 mg by mouth 2 (two) times daily.   3  . rOPINIRole (REQUIP) 4 MG tablet Take 4 mg by mouth at bedtime.    Marland Kitchen spironolactone (ALDACTONE) 100 MG tablet Take 100 mg by mouth daily.    Marland Kitchen zolpidem (AMBIEN) 10 MG tablet  Take 10 mg by mouth at bedtime.      No current facility-administered medications for this visit.     Past Medical History:  Diagnosis Date  . Allergy   . Anemia 1975-1976   . Anginal pain ([place])   . Anxiety    takes Valium daily as needed  . Arthritis    "spine" (12/03/2016)  . Asthma    has inhalers but doesn't use (12/03/2016)  . CAD (coronary artery disease)    Moderate nonobstructive 2012-2013, [place]; No angiographic evidence of CAD 01/31/15 [facility]  . Chronic bronchitis ([place])    "get it a couple times q yr" (12/03/2016)  . Chronic kidney disease   .  Chronic lower back pain    budlging disc   . Claustrophobia   . COPD (chronic obstructive pulmonary disease) ([place])   . Daily headache   . Depression   . Diverticulitis   . Family history of adverse reaction to anesthesia    2 daughters gets extremely sick   . GERD (gastroesophageal reflux disease)    takes Dexilant daily  . Heart murmur   . History of blood transfusion 1975-1976 "several"   no abnormal reaction noted  . History of colitis   . History of colon polyps    benign  . History of gastric ulcer   . History of hiatal hernia    "small one"  . History of MRSA infection 2017  . Hyperlipidemia    was on meds but has been off over a yr  . Hypertension    takes Amlodipine and Maxzide daily  . Insomnia    takes Ambien nightly  . Iron deficiency anemia    "when I was young"  . Lung nodules   . Microscopic hematuria   . Migraine    "2-3/wk" (12/03/2016)  . MS (multiple sclerosis) ([place])    questionable per pt  . Myocardial infarction ([place]) 2000   "mild one"  . Osteoporosis   . Peripheral neuropathy    weakness,numbness,and tingling. Takes Gabapentin daily  . Pneumonia 06/2015  . PTSD (post-traumatic stress disorder) dx'd 2016/2017   "was dx'd w/bipolar in 1991; replaced w/PTSD dx 2016/2017" (12/03/2016)  . Restless leg syndrome    takes Requip daily  . Stroke 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 Type II diabetes mellitus ([place])    "went off insulin in 2012/2013" (12/03/2016)    Past Surgical History:  Procedure Laterality Date  . ABDOMINAL AORTOGRAM N/A 12/03/2016   Procedure: Abdominal Aortogram;  Surgeon: Angelia Mould, MD;  Location: [facility];  Service: Cardiovascular;  Laterality: N/A;  . ABDOMINAL HYSTERECTOMY  12/1987  . ADENOIDECTOMY  1975  . ANKLE SURGERY Bilateral 1993; 1995 X2   "stabilzation done; 1 on the right; 2 on the left"  . APPENDECTOMY  1989  . BACK  SURGERY    . BREAST LUMPECTOMY Left    "benign tumor"  . CARDIAC CATHETERIZATION N/A 01/31/2015   Procedure: Left Heart Cath and Coronary Angiography;  Surgeon: Burnell Blanks, MD;  Location: [facility];  Service: Cardiovascular;  Laterality: N/A;  . CARDIAC CATHETERIZATION  2009; 2011  . COLONOSCOPY    . DILATION AND CURETTAGE OF UTERUS    . ESOPHAGOGASTRODUODENOSCOPY    . LAPAROSCOPIC CHOLECYSTECTOMY  1996  . NASAL RECONSTRUCTION  1976  . NASAL SINUS SURGERY  1975  . POSTERIOR LUMBAR FUSION  2005  . RADIOLOGY WITH ANESTHESIA N/A 01/15/2016   Procedure: MRI LUMBAR SPINE WITHOUT;  Surgeon: Medication Radiologist, MD;  Location: [facility];  Service: Radiology;  Laterality: N/A;  . RADIOLOGY WITH ANESTHESIA N/A 06/29/2016   Procedure: MRI OF THE BRAIN WITH AND WITHOUT;  Surgeon: Medication Radiologist, MD;  Location: [facility];  Service: Radiology;  Laterality: N/A;  . RENAL ANGIOGRAPHY N/A 12/03/2016   Procedure: Renal Angiography;  Surgeon: Angelia Mould, MD;  Location: [facility];  Service: Cardiovascular;  Laterality: N/A;  . TONSILLECTOMY  1992  . TUBAL LIGATION    . 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 Marital status: Divorced    Spouse name: Not on file  . Number of children: 3  . Years of education: Some college  . Highest education level: Not on file  Social Needs  . Financial resource strain: Not on file  . Food insecurity - worry: Not on file  . Food insecurity - inability: Not on file  . Transportation needs - medical: Not on file  . Transportation needs - non-medical: Not on file  Occupational History    Comment: Disability for back pain  Tobacco Use  . Smoking status: Current Every Day Smoker    Packs/day: 2.00    Years: 45.00    Pack years: 90.00    Types: Cigarettes    Start date: 03/04/1971  . Smokeless tobacco: Never Used    Substance and Sexual Activity  . Alcohol use: No    Alcohol/week: 0.0 oz  . Drug use: No  . Sexual activity: No    Birth control/protection: Surgical  Other Topics Concern  . Not on file  Social History Narrative   Lives at home with her daughter.   Right-handed.   1-2 cups coffee in the morning and 2 sodas per day.     Vitals:   05/02/17 1045 05/02/17 1052  BP: (!) 197/122 (!) 184/92  Pulse: 73 75  Weight: 214 lb (97.1 kg)   Height: 5\' 6"  (1.676 m)     Wt Readings from Last 3 Encounters:  05/02/17 214 lb (97.1 kg)  04/24/17 213 lb (96.6 kg)  04/17/17 215 lb (97.5 kg)     PHYSICAL EXAM General: NAD HEENT: Normal. Neck: No JVD, no thyromegaly. Lungs: Clear to auscultation bilaterally with normal respiratory effort. CV: Regular rate and rhythm, normal S1/S2, no S3/S4, no murmur. No pretibial or periankle edema.  No carotid bruit.   Abdomen: Soft, nontender, no distention.  Neurologic: Alert and oriented.  Psych: Normal affect. Skin: Normal. Musculoskeletal: No gross deformities.    ECG: Most recent ECG reviewed.   Labs: Lab Results  Component Value Date/Time   K 3.2 (L) 03/20/2017 01:10 AM   BUN 18 03/20/2017 01:10 AM   CREATININE 0.70 03/20/2017 01:10 AM   ALT 20 12/03/2016 11:17 AM   TSH 1.678 12/04/2016 02:16 AM   TSH 3.630 03/20/2014 12:37 AM   HGB 12.9 03/20/2017 01:10 AM     Lipids: Lab Results  Component Value Date/Time   LDLCALC 57 04/04/2016 05:17 AM   CHOL 114 04/04/2016 05:17 AM   TRIG 153 (H) 04/04/2016 05:17 AM   HDL 26 (L) 04/04/2016 05:17 AM       ASSESSMENT AND PLAN: 1.  Malignant hypertension with end-organ damage (history of stroke): She is currently taking  amlodipine 10 mg, chlorthalidone 25 mg twice daily, losartan 50 mg twice daily, hydralazine, 25 mg tid, and spironolactone 100 mg daily. Blood pressure remains severely elevated. I will increase hydralazine to 50 mg TID.  As hydralazine leads to sodium and water retention  and she has developed bilateral ankle and feet swelling, I will add Lasix 20 mg daily with potassium 20 mEq daily and check a basic metabolic panel within 5 days. She has also been having severe chest pain, I will add atenolol 50 mg daily.  2.  Sleep disordered breathing: She has yet to pursue the sleep study which I previously ordered.  Sleep apnea is a known etiology of uncontrolled hypertension.  3.  History of CVA: She stopped Plavix.  She should also be on statin therapy.  4.  Chest pain: Due to malignant hypertension. Coronary angiography was normal in 2016 as detailed above.  I will increase hydralazine to 50 mg TID.  As hydralazine leads to sodium and water retention and she has developed bilateral ankle and feet swelling, I will add Lasix 20 mg daily with potassium 20 mEq daily and check a basic metabolic panel within 5 days. I will add atenolol 50 mg daily.   Disposition: Follow up 2 months  Time spent: 40 minutes, of which greater than 50% was spent reviewing symptoms, relevant blood tests and studies, and discussing management plan with the patient.    Kate Sable, M.D., F.A.C.C.

## 2017-05-02 NOTE — Patient Instructions (Signed)
Medication Instructions:   Increase Hydralazine to 50mg  three times per day  Begin Lasix 20mg  daily.  Begin Potassium 6meq daily  Begin Atenolol 50mg  daily.   Continue all other medications.    Labwork:  BMET - order given today.  Due in 5 days.  Office will contact with results via phone or letter.    Testing/Procedures: none  Follow-Up: 2 months   Any Other Special Instructions Will Be Listed Below (If Applicable).  If you need a refill on your cardiac medications before your next appointment, please call your pharmacy.

## 2017-05-10 ENCOUNTER — Ambulatory Visit: Payer: Medicare Other | Attending: Pulmonary Disease | Admitting: Neurology

## 2017-05-10 DIAGNOSIS — R0683 Snoring: Secondary | ICD-10-CM | POA: Insufficient documentation

## 2017-05-10 DIAGNOSIS — G2581 Restless legs syndrome: Secondary | ICD-10-CM

## 2017-05-16 ENCOUNTER — Emergency Department (HOSPITAL_COMMUNITY): Payer: Medicare Other

## 2017-05-16 ENCOUNTER — Inpatient Hospital Stay (HOSPITAL_COMMUNITY)
Admission: EM | Admit: 2017-05-16 | Discharge: 2017-05-23 | DRG: 059 | Disposition: A | Discharge status: Skilled Nursing Facility | Payer: Medicare Other | Attending: Family Medicine | Admitting: Family Medicine

## 2017-05-16 ENCOUNTER — Encounter (HOSPITAL_COMMUNITY): Payer: Self-pay | Admitting: Emergency Medicine

## 2017-05-16 DIAGNOSIS — I10 Essential (primary) hypertension: Secondary | ICD-10-CM

## 2017-05-16 DIAGNOSIS — IMO0002 Reserved for concepts with insufficient information to code with codable children: Secondary | ICD-10-CM | POA: Diagnosis present

## 2017-05-16 DIAGNOSIS — F459 Somatoform disorder, unspecified: Secondary | ICD-10-CM | POA: Diagnosis present

## 2017-05-16 DIAGNOSIS — G35 Multiple sclerosis: Secondary | ICD-10-CM

## 2017-05-16 DIAGNOSIS — I6930 Unspecified sequelae of cerebral infarction: Secondary | ICD-10-CM | POA: Diagnosis not present

## 2017-05-16 DIAGNOSIS — Z888 Allergy status to other drugs, medicaments and biological substances status: Secondary | ICD-10-CM

## 2017-05-16 DIAGNOSIS — G43109 Migraine with aura, not intractable, without status migrainosus: Secondary | ICD-10-CM | POA: Diagnosis present

## 2017-05-16 DIAGNOSIS — Z9102 Food additives allergy status: Secondary | ICD-10-CM

## 2017-05-16 DIAGNOSIS — E1140 Type 2 diabetes mellitus with diabetic neuropathy, unspecified: Secondary | ICD-10-CM | POA: Diagnosis present

## 2017-05-16 DIAGNOSIS — I69312 Visuospatial deficit and spatial neglect following cerebral infarction: Secondary | ICD-10-CM

## 2017-05-16 DIAGNOSIS — Z66 Do not resuscitate: Secondary | ICD-10-CM | POA: Diagnosis present

## 2017-05-16 DIAGNOSIS — Z880 Allergy status to penicillin: Secondary | ICD-10-CM

## 2017-05-16 DIAGNOSIS — Z791 Long term (current) use of non-steroidal anti-inflammatories (NSAID): Secondary | ICD-10-CM

## 2017-05-16 DIAGNOSIS — Z91018 Allergy to other foods: Secondary | ICD-10-CM

## 2017-05-16 DIAGNOSIS — D6859 Other primary thrombophilia: Secondary | ICD-10-CM | POA: Diagnosis present

## 2017-05-16 DIAGNOSIS — I69354 Hemiplegia and hemiparesis following cerebral infarction affecting left non-dominant side: Secondary | ICD-10-CM

## 2017-05-16 DIAGNOSIS — Z79899 Other long term (current) drug therapy: Secondary | ICD-10-CM

## 2017-05-16 DIAGNOSIS — E538 Deficiency of other specified B group vitamins: Secondary | ICD-10-CM | POA: Diagnosis present

## 2017-05-16 DIAGNOSIS — G8929 Other chronic pain: Secondary | ICD-10-CM | POA: Diagnosis present

## 2017-05-16 DIAGNOSIS — I160 Hypertensive urgency: Secondary | ICD-10-CM | POA: Diagnosis present

## 2017-05-16 DIAGNOSIS — M479 Spondylosis, unspecified: Secondary | ICD-10-CM | POA: Diagnosis present

## 2017-05-16 DIAGNOSIS — Z981 Arthrodesis status: Secondary | ICD-10-CM

## 2017-05-16 DIAGNOSIS — R079 Chest pain, unspecified: Secondary | ICD-10-CM

## 2017-05-16 DIAGNOSIS — R29709 NIHSS score 9: Secondary | ICD-10-CM | POA: Diagnosis present

## 2017-05-16 DIAGNOSIS — R402413 Glasgow coma scale score 13-15, at hospital admission: Secondary | ICD-10-CM | POA: Diagnosis present

## 2017-05-16 DIAGNOSIS — G2581 Restless legs syndrome: Secondary | ICD-10-CM | POA: Diagnosis present

## 2017-05-16 DIAGNOSIS — J449 Chronic obstructive pulmonary disease, unspecified: Secondary | ICD-10-CM | POA: Diagnosis not present

## 2017-05-16 DIAGNOSIS — R531 Weakness: Secondary | ICD-10-CM

## 2017-05-16 DIAGNOSIS — E1165 Type 2 diabetes mellitus with hyperglycemia: Secondary | ICD-10-CM | POA: Diagnosis present

## 2017-05-16 DIAGNOSIS — G4700 Insomnia, unspecified: Secondary | ICD-10-CM | POA: Diagnosis present

## 2017-05-16 DIAGNOSIS — I2510 Atherosclerotic heart disease of native coronary artery without angina pectoris: Secondary | ICD-10-CM | POA: Diagnosis present

## 2017-05-16 DIAGNOSIS — Z881 Allergy status to other antibiotic agents status: Secondary | ICD-10-CM

## 2017-05-16 DIAGNOSIS — I6523 Occlusion and stenosis of bilateral carotid arteries: Secondary | ICD-10-CM | POA: Diagnosis present

## 2017-05-16 DIAGNOSIS — I129 Hypertensive chronic kidney disease with stage 1 through stage 4 chronic kidney disease, or unspecified chronic kidney disease: Secondary | ICD-10-CM | POA: Diagnosis present

## 2017-05-16 DIAGNOSIS — R29898 Other symptoms and signs involving the musculoskeletal system: Secondary | ICD-10-CM | POA: Diagnosis present

## 2017-05-16 DIAGNOSIS — I639 Cerebral infarction, unspecified: Secondary | ICD-10-CM

## 2017-05-16 DIAGNOSIS — Z8614 Personal history of Methicillin resistant Staphylococcus aureus infection: Secondary | ICD-10-CM

## 2017-05-16 DIAGNOSIS — R0789 Other chest pain: Secondary | ICD-10-CM

## 2017-05-16 DIAGNOSIS — Z8659 Personal history of other mental and behavioral disorders: Secondary | ICD-10-CM

## 2017-05-16 DIAGNOSIS — F17210 Nicotine dependence, cigarettes, uncomplicated: Secondary | ICD-10-CM | POA: Diagnosis present

## 2017-05-16 DIAGNOSIS — M545 Low back pain: Secondary | ICD-10-CM | POA: Diagnosis present

## 2017-05-16 DIAGNOSIS — I252 Old myocardial infarction: Secondary | ICD-10-CM

## 2017-05-16 DIAGNOSIS — M810 Age-related osteoporosis without current pathological fracture: Secondary | ICD-10-CM | POA: Diagnosis present

## 2017-05-16 DIAGNOSIS — Z7902 Long term (current) use of antithrombotics/antiplatelets: Secondary | ICD-10-CM

## 2017-05-16 DIAGNOSIS — N189 Chronic kidney disease, unspecified: Secondary | ICD-10-CM | POA: Diagnosis present

## 2017-05-16 DIAGNOSIS — H510 Palsy (spasm) of conjugate gaze: Secondary | ICD-10-CM | POA: Diagnosis present

## 2017-05-16 DIAGNOSIS — Z886 Allergy status to analgesic agent status: Secondary | ICD-10-CM

## 2017-05-16 DIAGNOSIS — Z8719 Personal history of other diseases of the digestive system: Secondary | ICD-10-CM

## 2017-05-16 DIAGNOSIS — Z8711 Personal history of peptic ulcer disease: Secondary | ICD-10-CM

## 2017-05-16 HISTORY — DX: Patient's noncompliance with other medical treatment and regimen due to unspecified reason: Z91.199

## 2017-05-16 HISTORY — DX: Patient's noncompliance with other medical treatment and regimen: Z91.19

## 2017-05-16 LAB — CBC WITH DIFFERENTIAL/PLATELET
BASOS ABS: 0 10*3/uL (ref 0.0–0.1)
BASOS PCT: 0 %
EOS PCT: 2 %
Eosinophils Absolute: 0.2 10*3/uL (ref 0.0–0.7)
HEMATOCRIT: 41.7 % (ref 36.0–46.0)
Hemoglobin: 13.2 g/dL (ref 12.0–15.0)
LYMPHS PCT: 45 %
Lymphs Abs: 5.8 10*3/uL — ABNORMAL HIGH (ref 0.7–4.0)
MCH: 29.3 pg (ref 26.0–34.0)
MCHC: 31.7 g/dL (ref 30.0–36.0)
MCV: 92.5 fL (ref 78.0–100.0)
MONO ABS: 0.7 10*3/uL (ref 0.1–1.0)
Monocytes Relative: 6 %
NEUTROS ABS: 6.1 10*3/uL (ref 1.7–7.7)
Neutrophils Relative %: 47 %
PLATELETS: 281 10*3/uL (ref 150–400)
RBC: 4.51 MIL/uL (ref 3.87–5.11)
RDW: 14.9 % (ref 11.5–15.5)
WBC: 12.9 10*3/uL — AB (ref 4.0–10.5)

## 2017-05-16 LAB — LIPASE, BLOOD: LIPASE: 26 U/L (ref 11–51)

## 2017-05-16 LAB — URINALYSIS, ROUTINE W REFLEX MICROSCOPIC
Bacteria, UA: NONE SEEN
Bilirubin Urine: NEGATIVE
GLUCOSE, UA: NEGATIVE mg/dL
KETONES UR: NEGATIVE mg/dL
LEUKOCYTES UA: NEGATIVE
Nitrite: NEGATIVE
PROTEIN: NEGATIVE mg/dL
Specific Gravity, Urine: 1.024 (ref 1.005–1.030)
pH: 7 (ref 5.0–8.0)

## 2017-05-16 LAB — COMPREHENSIVE METABOLIC PANEL
ALBUMIN: 3.7 g/dL (ref 3.5–5.0)
ALT: 14 U/L (ref 14–54)
ANION GAP: 8 (ref 5–15)
AST: 18 U/L (ref 15–41)
Alkaline Phosphatase: 81 U/L (ref 38–126)
BUN: 25 mg/dL — ABNORMAL HIGH (ref 6–20)
CALCIUM: 9.1 mg/dL (ref 8.9–10.3)
CO2: 25 mmol/L (ref 22–32)
Chloride: 105 mmol/L (ref 101–111)
Creatinine, Ser: 1.04 mg/dL — ABNORMAL HIGH (ref 0.44–1.00)
GFR calc Af Amer: >60 mL/min (ref >60)
GFR calc non Af Amer: 59 mL/min — ABNORMAL LOW (ref >60)
Glucose, Bld: 150 mg/dL — ABNORMAL HIGH (ref 65–99)
Potassium: 4 mmol/L (ref 3.5–5.1)
Sodium: 138 mmol/L (ref 135–145)
TOTAL PROTEIN: 7 g/dL (ref 6.5–8.1)
Total Bilirubin: 0.2 mg/dL — ABNORMAL LOW (ref 0.3–1.2)

## 2017-05-16 LAB — RAPID URINE DRUG SCREEN, HOSP PERFORMED
Amphetamines: NOT DETECTED
BARBITURATES: NOT DETECTED
BENZODIAZEPINES: NOT DETECTED
COCAINE: NOT DETECTED
Opiates: NOT DETECTED
TETRAHYDROCANNABINOL: NOT DETECTED

## 2017-05-16 LAB — I-STAT CHEM 8, ED
BUN: 23 mg/dL — ABNORMAL HIGH (ref 6–20)
CALCIUM ION: 1.18 mmol/L (ref 1.15–1.40)
Chloride: 101 mmol/L (ref 101–111)
Creatinine, Ser: 1 mg/dL (ref 0.44–1.00)
GLUCOSE: 149 mg/dL — AB (ref 65–99)
HCT: 41 % (ref 36.0–46.0)
HEMOGLOBIN: 13.9 g/dL (ref 12.0–15.0)
Potassium: 4.1 mmol/L (ref 3.5–5.1)
Sodium: 141 mmol/L (ref 135–145)
TCO2: 27 mmol/L (ref 22–32)

## 2017-05-16 LAB — D-DIMER, QUANTITATIVE: D-Dimer, Quant: 0.32 ug/mL-FEU (ref 0.00–0.50)

## 2017-05-16 LAB — I-STAT TROPONIN, ED: Troponin i, poc: 0 ng/mL (ref 0.00–0.08)

## 2017-05-16 LAB — PROTIME-INR
INR: 1.01
Prothrombin Time: 13.2 seconds (ref 11.4–15.2)

## 2017-05-16 LAB — CBG MONITORING, ED: Glucose-Capillary: 145 mg/dL — ABNORMAL HIGH (ref 65–99)

## 2017-05-16 LAB — BRAIN NATRIURETIC PEPTIDE: B Natriuretic Peptide: 31 pg/mL (ref 0.0–100.0)

## 2017-05-16 LAB — I-STAT BETA HCG BLOOD, ED (MC, WL, AP ONLY): I-stat hCG, quantitative: 14 m[IU]/mL — ABNORMAL HIGH (ref <5)

## 2017-05-16 LAB — TROPONIN I

## 2017-05-16 LAB — APTT: aPTT: 28 seconds (ref 24–36)

## 2017-05-16 LAB — ETHANOL

## 2017-05-16 MED ORDER — PANTOPRAZOLE SODIUM 40 MG PO TBEC
40.0000 mg | DELAYED_RELEASE_TABLET | Freq: Every day | ORAL | Status: DC
  Administered 2017-05-17 – 2017-05-23 (×7): 40 mg via ORAL
  Filled 2017-05-16 (×7): qty 1

## 2017-05-16 MED ORDER — ACETAMINOPHEN 325 MG PO TABS
650.0000 mg | ORAL_TABLET | ORAL | Status: DC | PRN
  Filled 2017-05-16: qty 2

## 2017-05-16 MED ORDER — ENOXAPARIN SODIUM 40 MG/0.4ML ~~LOC~~ SOLN: 40.0000 mg | SUBCUTANEOUS | Status: DC

## 2017-05-16 MED ORDER — GABAPENTIN 300 MG PO CAPS
600.0000 mg | ORAL_CAPSULE | Freq: Four times a day (QID) | ORAL | Status: DC
  Administered 2017-05-17 – 2017-05-23 (×25): 600 mg via ORAL
  Filled 2017-05-16 (×26): qty 2

## 2017-05-16 MED ORDER — ACETAMINOPHEN 650 MG RE SUPP: 650.0000 mg | RECTAL | Status: DC | PRN

## 2017-05-16 MED ORDER — ROPINIROLE HCL 1 MG PO TABS
4.0000 mg | ORAL_TABLET | Freq: Every day | ORAL | Status: DC
  Administered 2017-05-17 – 2017-05-22 (×6): 4 mg via ORAL
  Filled 2017-05-16 (×8): qty 4

## 2017-05-16 MED ORDER — MORPHINE SULFATE (PF) 2 MG/ML IV SOLN
1.0000 mg | INTRAVENOUS | Status: DC | PRN
  Administered 2017-05-17 – 2017-05-19 (×4): 2 mg via INTRAVENOUS
  Filled 2017-05-16 (×4): qty 1

## 2017-05-16 MED ORDER — ZOLPIDEM TARTRATE 5 MG PO TABS
5.0000 mg | ORAL_TABLET | Freq: Every evening | ORAL | Status: DC | PRN
  Administered 2017-05-17 – 2017-05-22 (×5): 5 mg via ORAL
  Filled 2017-05-16 (×5): qty 1

## 2017-05-16 MED ORDER — STROKE: EARLY STAGES OF RECOVERY BOOK
Freq: Once | Status: AC
  Administered 2017-05-22: 23:00:00
  Filled 2017-05-16 (×2): qty 1

## 2017-05-16 MED ORDER — IOPAMIDOL (ISOVUE-370) INJECTION 76%
100.0000 mL | Freq: Once | INTRAVENOUS | Status: AC | PRN
  Administered 2017-05-16: 100 mL via INTRAVENOUS

## 2017-05-16 MED ORDER — ASPIRIN 81 MG PO CHEW: 324.0000 mg | CHEWABLE_TABLET | Freq: Once | ORAL | Status: DC

## 2017-05-16 MED ORDER — HYDRALAZINE HCL 20 MG/ML IJ SOLN: 10.0000 mg | Freq: Once | INTRAMUSCULAR | Status: DC

## 2017-05-16 MED ORDER — CLOPIDOGREL BISULFATE 75 MG PO TABS
75.0000 mg | ORAL_TABLET | Freq: Every day | ORAL | Status: DC
  Filled 2017-05-16: qty 1

## 2017-05-16 MED ORDER — ASPIRIN 300 MG RE SUPP: 300.0000 mg | Freq: Every day | RECTAL | Status: DC

## 2017-05-16 MED ORDER — ACETAMINOPHEN 160 MG/5ML PO SOLN: 650.0000 mg | ORAL | Status: DC | PRN

## 2017-05-16 MED ORDER — METHOCARBAMOL 500 MG PO TABS
500.0000 mg | ORAL_TABLET | Freq: Four times a day (QID) | ORAL | Status: DC | PRN
  Administered 2017-05-17 – 2017-05-19 (×2): 500 mg via ORAL
  Filled 2017-05-16 (×2): qty 1

## 2017-05-16 MED ORDER — LORAZEPAM 1 MG PO TABS
1.0000 mg | ORAL_TABLET | Freq: Four times a day (QID) | ORAL | Status: DC | PRN
  Administered 2017-05-18 – 2017-05-22 (×6): 1 mg via ORAL
  Filled 2017-05-16 (×6): qty 1

## 2017-05-16 MED ORDER — ASPIRIN 325 MG PO TABS
325.0000 mg | ORAL_TABLET | Freq: Every day | ORAL | Status: DC
  Filled 2017-05-16: qty 1

## 2017-05-16 MED ORDER — ALBUTEROL SULFATE (2.5 MG/3ML) 0.083% IN NEBU: 3.0000 mL | INHALATION_SOLUTION | Freq: Four times a day (QID) | RESPIRATORY_TRACT | Status: DC | PRN

## 2017-05-16 MED ORDER — SENNOSIDES-DOCUSATE SODIUM 8.6-50 MG PO TABS: 1.0000 | ORAL_TABLET | Freq: Every evening | ORAL | Status: DC | PRN

## 2017-05-16 MED ORDER — LABETALOL HCL 5 MG/ML IV SOLN
5.0000 mg | INTRAVENOUS | Status: DC | PRN
  Administered 2017-05-19: 10 mg via INTRAVENOUS
  Administered 2017-05-19 – 2017-05-21 (×3): 5 mg via INTRAVENOUS
  Filled 2017-05-16 (×4): qty 4

## 2017-05-16 NOTE — H&P (Signed)
History and Physical    Katrina [name] HGD:[id number] DOB: 27-Aug-1960 DOA: 05/16/2017  PCP: Berkley Harvey, NP   Patient coming from: Home  Chief Complaint: Chest pain, headache, left-sided weakness   HPI: Katrina [name] is a 56 y.o. female with medical history significant for history of CVA with residual deficits, accelerated hypertension, and COPD, now presenting to the emergency department with numerous complaints, including intermittent chest pain, headache, and left-sided weakness.  Patient reports recurrent, intermittent pain in the left chest since yesterday, lasting approximately 1 minute at a time without radiation, and without alleviating or exacerbating factors identified.  She also reports some left-sided weakness, but has difficulty determining the time of onset, noting that some of it may be chronic and it seems to wax and wane.  Patient was suffering from a headache that developed gradually several hours ago, eased-off spontaneously, now returns. The headache is moderate in intensity, dull in character, localized to behind the left eye, similar to her chronic headaches, and without alleviating or exacerbating factors.  No fevers or chills and no significant dyspnea or cough.  ED Course: Upon arrival to the ED, patient is found to be afebrile, saturating well on room air, hypertensive to 220/100, and with vitals otherwise stable.  EKG features normal sinus rhythm, chest x-ray is negative for acute cardiopulmonary disease, and head CT is normal.  Chemistry panel features a serum creatinine 1.04, similar to priors, and CBC is notable for leukocytosis to 12,900.  Troponin is undetectable, BNP is normal, and INR is within normal is negative and ethanol level is undetectable.  Code stroke was called and telemetry neurologist evaluated the patient.  She underwent CTA head and patient large vessel occlusion.  CTA is notable for severe long segment of stenosis in her diminutive left vertebral  artery, worse than the study in April 2017, but unlikely to account for her symptoms.  Also noted on CT has mild bilateral carotid bifurcation atherosclerosis without hemodynamically significant stenosis.  Patient was treated with 324 mg of aspirin, remained hemodynamically stable and in no apparent respiratory distress, and will be observed on the telemetry unit for ongoing evaluation and management of atypical chest pain, headache, and left-sided weakness worse than her baseline and concerning for recurrent CVA.  Review of Systems:  All other systems reviewed and apart from HPI, are negative.  Past Medical History:  Diagnosis Date  . Allergy   . Anemia 1975-1976   . Anginal pain ([place])   . Anxiety    takes Valium daily as needed  . Arthritis    "spine" (12/03/2016)  . Asthma    has inhalers but doesn't use (12/03/2016)  . CAD (coronary artery disease)    Moderate nonobstructive 2012-2013, [place]; No angiographic evidence of CAD 01/31/15 [facility]  . Chronic bronchitis ([place])    "get it a couple times q yr" (12/03/2016)  . Chronic kidney disease   . Chronic lower back pain    budlging disc   . Claustrophobia   . COPD (chronic obstructive pulmonary disease) ([place])   . Daily headache   . Depression   . Diverticulitis   . Family history of adverse reaction to anesthesia    2 daughters gets extremely sick   . GERD (gastroesophageal reflux disease)    takes Dexilant daily  . Heart murmur   . History of blood transfusion 1975-1976 "several"   no abnormal reaction noted  . History of colitis   . History of colon polyps  benign  . History of gastric ulcer   . History of hiatal hernia    "small one"  . History of MRSA infection 2017  . Hyperlipidemia    was on meds but has been off over a yr  . Hypertension    takes Amlodipine and Maxzide daily  . Insomnia    takes Ambien nightly  . Iron deficiency anemia    "when I was young"  . Lung nodules   . Microscopic hematuria   . Migraine     "2-3/wk" (12/03/2016)  . MS (multiple sclerosis) ([place])    questionable per pt  . Myocardial infarction ([place]) 2000   "mild one"  . Noncompliance   . Osteoporosis   . Peripheral neuropathy    weakness,numbness,and tingling. Takes Gabapentin daily  . Pneumonia 06/2015  . PTSD (post-traumatic stress disorder) dx'd 2016/2017   "was dx'd w/bipolar in 1991; replaced w/PTSD dx 2016/2017" (12/03/2016)  . Restless leg syndrome    takes Requip daily  . 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 Type II diabetes mellitus ([place])    "went off insulin in 2012/2013" (12/03/2016)    Past Surgical History:  Procedure Laterality Date  . ABDOMINAL AORTOGRAM N/A 12/03/2016   Procedure: Abdominal Aortogram;  Surgeon: Angelia Mould, MD;  Location: [facility];  Service: Cardiovascular;  Laterality: N/A;  . ABDOMINAL HYSTERECTOMY  12/1987  . ADENOIDECTOMY  1975  . ANKLE SURGERY Bilateral 1993; 1995 X2   "stabilzation done; 1 on the right; 2 on the left"  . APPENDECTOMY  1989  . BACK SURGERY    . BREAST LUMPECTOMY Left    "benign tumor"  . CARDIAC CATHETERIZATION N/A 01/31/2015   Procedure: Left Heart Cath and Coronary Angiography;  Surgeon: Burnell Blanks, MD;  Location: [facility];  Service: Cardiovascular;  Laterality: N/A;  . CARDIAC CATHETERIZATION  2009; 2011  . COLONOSCOPY    . DILATION AND CURETTAGE OF UTERUS    . ESOPHAGOGASTRODUODENOSCOPY    . LAPAROSCOPIC CHOLECYSTECTOMY  1996  . NASAL RECONSTRUCTION  1976  . NASAL SINUS SURGERY  1975  . POSTERIOR LUMBAR FUSION  2005  . RADIOLOGY WITH ANESTHESIA N/A 01/15/2016   Procedure: MRI LUMBAR SPINE WITHOUT;  Surgeon: Medication Radiologist, MD;  Location: [facility];  Service: Radiology;  Laterality: N/A;  . RADIOLOGY WITH ANESTHESIA N/A 06/29/2016   Procedure: MRI OF THE BRAIN WITH AND WITHOUT;  Surgeon: Medication Radiologist, MD;  Location:  [facility];  Service: Radiology;  Laterality: N/A;  . RENAL ANGIOGRAPHY N/A 12/03/2016   Procedure: Renal Angiography;  Surgeon: Angelia Mould, MD;  Location: [facility];  Service: Cardiovascular;  Laterality: N/A;  . TONSILLECTOMY  1992  . TUBAL LIGATION    . TUMOR EXCISION  1998   from back of skull; developed;  MRSA from the area that had to be packed     reports that she has been smoking cigarettes.  She started smoking about 46 years ago. She has a 90.00 pack-year smoking history. she has never used smokeless tobacco. She reports that she does not drink alcohol or use drugs.  Allergies  Allergen Reactions  . Other Anaphylaxis and Swelling    [place]  . Penicillins Anaphylaxis    Has patient had a PCN reaction causing immediate rash, facial/tongue/throat swelling, SOB or lightheadedness with hypotension: Yes Has patient had a PCN reaction causing severe  rash involving mucus membranes or skin necrosis: Yes Has patient had a PCN reaction that required hospitalization Yes Has patient had a PCN reaction occurring within the last 10 years: No If all of the above answers are "NO", then may proceed with Cephalosporin use.   . Zithromax [Azithromycin] Anaphylaxis  . Aspirin Other (See Comments)    Due to stomach ulcers.   . Pineapple Rash  . Strawberry Extract Rash and Hives  . Wheat Bran Other (See Comments)    Sneezing, rhinorrhea  . Yeast-Related Products Other (See Comments)    Sneezing, rhinorrhea  . Aspartame And Phenylalanine Palpitations  . Mushroom Extract Complex Rash  . Nicardipine Nausea And Vomiting and Other (See Comments)    shaking    Family History  Problem Relation Age of Onset  . Coronary artery disease Father   . Emphysema Father   . Heart attack Father   . Stroke Father   . Cancer Father        Unsure of type   . Allergic rhinitis Father   . Depression Mother   . Cancer Mother        skin cancer  . Bipolar disorder Brother   . Drug abuse  Brother   . Cancer Brother        leukemia  . Bipolar disorder Daughter   . Allergic rhinitis Daughter   . Asthma Daughter   . Urticaria Daughter   . Diabetes Maternal Grandmother   . Stomach cancer Maternal Grandfather   . Cancer Maternal Grandfather        stomach   . Angioedema Neg Hx   . Atopy Neg Hx   . Eczema Neg Hx   . Immunodeficiency Neg Hx      Prior to Admission medications   Medication Sig Start Date End Date Taking? Authorizing Provider  albuterol (PROVENTIL HFA;VENTOLIN HFA) 108 (90 Base) MCG/ACT inhaler Inhale 2 puffs into the lungs every 6 (six) hours as needed. 06/09/16 06/09/17 Yes [provider]  amLODipine (NORVASC) 10 MG tablet Take 1 tablet (10 mg total) by mouth daily. 06/05/14  Yes Herminio Commons, MD  aspirin-acetaminophen-caffeine (EXCEDRIN MIGRAINE) [phone] MG tablet Take 2 tablets by mouth daily as needed for migraine.    Yes [provider]  atenolol (TENORMIN) 50 MG tablet Take 1 tablet (50 mg total) by mouth daily. 05/02/17  Yes Herminio Commons, MD  celecoxib (CELEBREX) 200 MG capsule Take 200 mg by mouth at bedtime. 05/02/17  Yes [provider]  chlorthalidone (HYGROTON) 25 MG tablet Take 25 mg by mouth 2 (two) times daily.    Yes [provider]  clopidogrel (PLAVIX) 75 MG tablet Take 75 mg by mouth daily.    Yes [provider]  dexlansoprazole (DEXILANT) 60 MG capsule Take 1 tab by mouth every morning. Patient taking differently: Take 60 mg by mouth every evening.  10/18/16  Yes Esterwood, Amy S, PA-C  furosemide (LASIX) 20 MG tablet Take 1 tablet (20 mg total) by mouth daily. 05/02/17 07/31/17 Yes Herminio Commons, MD  gabapentin (NEURONTIN) 600 MG tablet Take 600 mg by mouth 4 (four) times daily.   Yes [provider]  hydrALAZINE (APRESOLINE) 50 MG tablet Take 1 tablet (50 mg total) by mouth 3 (three) times daily. 05/02/17  Yes Herminio Commons, MD  losartan (COZAAR) 50 MG  tablet Take 50 mg by mouth 2 (two) times daily.  06/09/16  Yes [provider]  methocarbamol (ROBAXIN) 500 MG tablet  Take 1 tablet by mouth every 6 (six) hours as needed for muscle spasms.  05/02/17  Yes [provider]  potassium chloride SA (K-DUR,KLOR-CON) 20 MEQ tablet Take 1 tablet (20 mEq total) by mouth daily. 05/02/17  Yes Herminio Commons, MD  rOPINIRole (REQUIP) 4 MG tablet Take 4 mg by mouth at bedtime.   Yes [provider]  spironolactone (ALDACTONE) 100 MG tablet Take 100 mg by mouth daily.   Yes [provider]  zolpidem (AMBIEN CR) 12.5 MG CR tablet Take 12.5 mg by mouth at bedtime. Jun 09, 2017  Yes [provider]    Physical Exam: Vitals:   06/09/17 2121 June 09, 2017 2130 06-09-17 2200 09-Jun-2017 2215  BP: (!) 204/110 (!) 215/101 (!) 199/90 (!) 189/85  Pulse: 86  83 73  Resp: (!) 24 20 18 20   Temp:      TempSrc:      SpO2: 100%  97% 99%  Weight:      Height:          Constitutional: NAD, calm, in apparent discomfort Eyes: PERTLA, lids and conjunctivae normal ENMT: Mucous membranes are moist. Posterior pharynx clear of any exudate or lesions.   Neck: normal, supple, no masses, no thyromegaly Respiratory: clear to auscultation bilaterally, no wheezing, no crackles. Normal respiratory effort.   Cardiovascular: S1 & S2 heard, regular rate and rhythm. No significant JVD. Abdomen: No distension, no tenderness, no masses palpated. Bowel sounds normal.  Musculoskeletal: no clubbing / cyanosis. No joint deformity upper and lower extremities.  Skin: no significant rashes, lesions, ulcers. Warm, dry, well-perfused. Neurologic: PERRL, EOMI, left lower facial weakness. Sensation to light touch intact. Strength 4/5 throughout left leg, 3-4/5 throughout left arm, 5/5 on right.  Psychiatric: Alert and oriented x 3. Calm, cooperative.     Labs on Admission: I have personally reviewed following labs and imaging studies  CBC: Recent Labs    Lab 2017/06/09 2050 06/09/2017 2111  WBC 12.9*  --   NEUTROABS 6.1  --   HGB 13.2 13.9  HCT 41.7 41.0  MCV 92.5  --   PLT 281  --    Basic Metabolic Panel: Recent Labs  Lab June 09, 2017 2050 06-09-2017 2111  NA 138 141  K 4.0 4.1  CL 105 101  CO2 25  --   GLUCOSE 150* 149*  BUN 25* 23*  CREATININE 1.04* 1.00  CALCIUM 9.1  --    GFR: Estimated Creatinine Clearance: 74 mL/min (by C-G formula based on SCr of 1 mg/dL). Liver Function Tests: Recent Labs  Lab 06-09-2017 2050  AST 18  ALT 14  ALKPHOS 81  BILITOT 0.2*  PROT 7.0  ALBUMIN 3.7   Recent Labs  Lab Jun 09, 2017 2050  LIPASE 26   No results for input(s): AMMONIA in the last 168 hours. Coagulation Profile: Recent Labs  Lab 06/09/17 2050  INR 1.01   Cardiac Enzymes: Recent Labs  Lab 06/09/2017 2050  TROPONINI <0.03   BNP (last 3 results) No results for input(s): PROBNP in the last 8760 hours. HbA1C: No results for input(s): HGBA1C in the last 72 hours. CBG: Recent Labs  Lab 06-09-17 2102  GLUCAP 145*   Lipid Profile: No results for input(s): CHOL, HDL, LDLCALC, TRIG, CHOLHDL, LDLDIRECT in the last 72 hours. Thyroid Function Tests: No results for input(s): TSH, T4TOTAL, FREET4, T3FREE, THYROIDAB in the last 72 hours. Anemia Panel: No results for input(s): VITAMINB12, FOLATE, FERRITIN, TIBC, IRON, RETICCTPCT in the last 72 hours. Urine analysis:    Component  Value Date/Time   COLORURINE YELLOW 12/03/2016 [place] 12/03/2016 1647   LABSPEC 1.020 12/03/2016 1647   PHURINE 6.5 12/03/2016 1647   GLUCOSEU NEGATIVE 12/03/2016 1647   HGBUR SMALL (A) 12/03/2016 1647   BILIRUBINUR NEGATIVE 12/03/2016 1647   [place] 12/03/2016 1647   PROTEINUR 30 (A) 12/03/2016 1647   UROBILINOGEN 0.2 01/23/2015 2255   NITRITE NEGATIVE 12/03/2016 1647   LEUKOCYTESUR NEGATIVE 12/03/2016 1647   Sepsis Labs: @LABRCNTIP (procalcitonin:4,lacticidven:4) )No results found for this or any previous visit  (from the past 240 hour(s)).   Radiological Exams on Admission: Ct Angio Head W Or Wo Contrast  Result Date: 05/16/2017 CLINICAL DATA:  Sudden onset left-sided weakness EXAM: CT ANGIOGRAPHY HEAD AND NECK TECHNIQUE: Multidetector CT imaging of the head and neck was performed using the standard protocol during bolus administration of intravenous contrast. Multiplanar CT image reconstructions and MIPs were obtained to evaluate the vascular anatomy. Carotid stenosis measurements (when applicable) are obtained utilizing NASCET criteria, using the distal internal carotid diameter as the denominator. CONTRAST:  19mL ISOVUE-370 IOPAMIDOL (ISOVUE-370) INJECTION 76% COMPARISON:  Head CT 05/16/2017 Head CT 04/25/2017 FINDINGS: CTA NECK FINDINGS Aortic arch: There is minimal calcific atherosclerosis of the aortic arch. There is no aneurysm, dissection or hemodynamically significant stenosis of the visualized ascending aorta and aortic arch. Conventional 3 vessel aortic branching pattern. The visualized proximal subclavian arteries are normal. Right carotid system: The right common carotid origin is widely patent. There is no common carotid or internal carotid artery dissection or aneurysm. Atherosclerotic calcification at the carotid bifurcation without hemodynamically significant stenosis. Left carotid system: The left common carotid origin is widely patent. There is no common carotid or internal carotid artery dissection or aneurysm. Atherosclerotic calcification at the carotid bifurcation without hemodynamically significant stenosis. Vertebral arteries: The vertebral system is right dominant. The left vertebral artery is diminutive along its entire course and its origin is not visible. Minimal opacification begins at the C4 level. The remainder of the left vertebral artery is severely stenotic, worsened from the CTA obtained 10/01/2015. The right vertebral artery is normal to its confluence with the basilar artery.  Skeleton: There is no bony spinal canal stenosis. No lytic or blastic lesions. Other neck: The nasopharynx is clear. The oropharynx and hypopharynx are normal. The epiglottis is normal. The supraglottic larynx, glottis and subglottic larynx are normal. No retropharyngeal collection. The parapharyngeal spaces are preserved. The parotid and submandibular glands are normal. No sialolithiasis or salivary ductal dilatation. The thyroid gland is normal. There is no cervical lymphadenopathy. Upper chest: No pneumothorax or pleural effusion. No nodules or masses. Review of the MIP images confirms the above findings CTA HEAD FINDINGS Anterior circulation: --Intracranial internal carotid arteries: Normal. --Anterior cerebral arteries: Normal. --Middle cerebral arteries: Patent. Mild atherosclerotic narrowing of the right middle cerebral artery without hemodynamically significant stenosis. Normal left MCA. --Posterior communicating arteries: Present on the right, absent on the left. Posterior circulation: --Posterior cerebral arteries: Normal. --Superior cerebellar arteries: Normal. --Basilar artery: Normal. --Anterior inferior cerebellar arteries: Normal. --Posterior inferior cerebellar arteries: Normal. Venous sinuses: As permitted by contrast timing, patent. Anatomic variants: None Delayed phase: No parenchymal contrast enhancement. Review of the MIP images confirms the above findings IMPRESSION: 1. No emergent intracranial large vessel occlusion. 2. 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 This is probably a chronic finding and would not account for the reported left-sided weakness. The intracranial posterior circulation is entirely patent. 3. Mild bilateral carotid  bifurcation atherosclerotic calcification without hemodynamically significant stenosis. Aortic Atherosclerosis (ICD10-I70.0). These results were called by telephone at the time of  interpretation on 05/16/2017 at 10:13 pm to Dr. Criss Rosales, who verbally acknowledged these results. Electronically Signed   By: Ulyses Jarred M.D.   On: 05/16/2017 22:13   Dg Chest 1 View  Result Date: 05/16/2017 CLINICAL DATA:  Chest pain EXAM: CHEST 1 VIEW COMPARISON:  04/17/2017 FINDINGS: The heart size and mediastinal contours are within normal limits. Both lungs are clear. The visualized skeletal structures are unremarkable. IMPRESSION: No active disease. Electronically Signed   By: Inez Catalina M.D.   On: 05/16/2017 21:53   Ct Angio Neck W And/or Wo Contrast  Result Date: 05/16/2017 CLINICAL DATA:  Sudden onset left-sided weakness EXAM: CT ANGIOGRAPHY HEAD AND NECK TECHNIQUE: Multidetector CT imaging of the head and neck was performed using the standard protocol during bolus administration of intravenous contrast. Multiplanar CT image reconstructions and MIPs were obtained to evaluate the vascular anatomy. Carotid stenosis measurements (when applicable) are obtained utilizing NASCET criteria, using the distal internal carotid diameter as the denominator. CONTRAST:  164mL ISOVUE-370 IOPAMIDOL (ISOVUE-370) INJECTION 76% COMPARISON:  Head CT 05/16/2017 Head CT 04/25/2017 FINDINGS: CTA NECK FINDINGS Aortic arch: There is minimal calcific atherosclerosis of the aortic arch. There is no aneurysm, dissection or hemodynamically significant stenosis of the visualized ascending aorta and aortic arch. Conventional 3 vessel aortic branching pattern. The visualized proximal subclavian arteries are normal. Right carotid system: The right common carotid origin is widely patent. There is no common carotid or internal carotid artery dissection or aneurysm. Atherosclerotic calcification at the carotid bifurcation without hemodynamically significant stenosis. Left carotid system: The left common carotid origin is widely patent. There is no common carotid or internal carotid artery dissection or aneurysm. Atherosclerotic  calcification at the carotid bifurcation without hemodynamically significant stenosis. Vertebral arteries: The vertebral system is right dominant. The left vertebral artery is diminutive along its entire course and its origin is not visible. Minimal opacification begins at the C4 level. The remainder of the left vertebral artery is severely stenotic, worsened from the CTA obtained 10/01/2015. The right vertebral artery is normal to its confluence with the basilar artery. Skeleton: There is no bony spinal canal stenosis. No lytic or blastic lesions. Other neck: The nasopharynx is clear. The oropharynx and hypopharynx are normal. The epiglottis is normal. The supraglottic larynx, glottis and subglottic larynx are normal. No retropharyngeal collection. The parapharyngeal spaces are preserved. The parotid and submandibular glands are normal. No sialolithiasis or salivary ductal dilatation. The thyroid gland is normal. There is no cervical lymphadenopathy. Upper chest: No pneumothorax or pleural effusion. No nodules or masses. Review of the MIP images confirms the above findings CTA HEAD FINDINGS Anterior circulation: --Intracranial internal carotid arteries: Normal. --Anterior cerebral arteries: Normal. --Middle cerebral arteries: Patent. Mild atherosclerotic narrowing of the right middle cerebral artery without hemodynamically significant stenosis. Normal left MCA. --Posterior communicating arteries: Present on the right, absent on the left. Posterior circulation: --Posterior cerebral arteries: Normal. --Superior cerebellar arteries: Normal. --Basilar artery: Normal. --Anterior inferior cerebellar arteries: Normal. --Posterior inferior cerebellar arteries: Normal. Venous sinuses: As permitted by contrast timing, patent. Anatomic variants: None Delayed phase: No parenchymal contrast enhancement. Review of the MIP images confirms the above findings IMPRESSION: 1. No emergent intracranial large vessel occlusion. 2. 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 This is probably a chronic finding and would not account for the reported left-sided weakness.  The intracranial posterior circulation is entirely patent. 3. Mild bilateral carotid bifurcation atherosclerotic calcification without hemodynamically significant stenosis. Aortic Atherosclerosis (ICD10-I70.0). These results were called by telephone at the time of interpretation on 05/16/2017 at 10:13 pm to Dr. Criss Rosales, who verbally acknowledged these results. Electronically Signed   By: Ulyses Jarred M.D.   On: 05/16/2017 22:13   Ct Head Code Stroke Wo Contrast`  Result Date: 05/16/2017 CLINICAL DATA:  Code stroke.  Sudden onset left-sided weakness EXAM: CT HEAD WITHOUT CONTRAST TECHNIQUE: Contiguous axial images were obtained from the base of the skull through the vertex without intravenous contrast. COMPARISON:  Head CT 04/25/2017 FINDINGS: Brain: No mass lesion or acute hemorrhage. No focal hypoattenuation of the basal ganglia or cortex to indicate infarcted tissue. No hydrocephalus or age advanced atrophy. Vascular: No hyperdense vessel. No advanced atherosclerotic calcification of the arteries at the skull base. Skull: Normal visualized skull base, calvarium and extracranial soft tissues. Sinuses/Orbits: No sinus fluid levels or advanced mucosal thickening. No mastoid effusion. Normal orbits. ASPECTS [facility] Stroke Program Early CT Score) - Ganglionic level infarction (caudate, lentiform nuclei, internal capsule, insula, M1-M3 cortex): 7 - Supraganglionic infarction (M4-M6 cortex): 3 Total score (0-10 with 10 being normal): 10 IMPRESSION: 1. Normal head CT. 2. ASPECTS is 10. These results were called by telephone at the time of interpretation on 05/16/2017 at 9:16 pm to Dr. Ezequiel Essex , who verbally acknowledged these results. Electronically Signed   By: Ulyses Jarred M.D.   On: 05/16/2017 21:16     EKG: Independently reviewed. Normal sinus rhythm.   Assessment/Plan  1. Headache; left-sided weakness; Hx of CVA  - Pt presents with headache and is noted to have significant left-sided weakness; difficult to discern how much is chronic from old strokes  - Code stroke was called, head CT normal, CTA without emergent LVO or hemodynamically significant stenosis  - Plan to continue cardiac monitoring, frequent neuro checks, PT/OT/SLP evals  - Obtain MRI brain, MRA head, echocardiogram, fasting lipid panel, and A1c  - Continue ASA and Plavix   2. Chest pain  - Pt reports brief, intermittent chest pain for past 2 days without alleviating or exacerbating factors  - Initial troponin undetectable, CXR unremarkable, and EKG without any apparent acute ischemic features  - Cath in August 2016 notable for no CAD, normal LV systolic function  - Will continue cardiac monitoring, obtain serial troponin measurements, and check echocardiogram as above   -Treated with ASA 324 mg in ED, continue Plavix   3. 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 COPD - No wheezing, cough, or dyspnea on admission   - Continue prn albuterol     DVT prophylaxis: Lovenox Code Status: DNR Family Communication: Discussed with patient Disposition Plan: Observe on telemetry Consults called: Neurology Admission status: Observation    Vianne Bulls, MD [facility] Pager [phone]  If 7PM-7AM, please contact night-coverage [email or web address] Password [email or web address]  05/16/2017, 10:45 PM

## 2017-05-16 NOTE — Procedures (Signed)
Gann A. Merlene Laughter, MD     [email or web address]             NOCTURNAL POLYSOMNOGRAPHY   LOCATION: [facility]   Patient Name: Katrina [name], Katrina [name] Date: 05/10/2017 Gender: Female D.O.B: January 08, 1961 Age (years): 56 Referring Provider: Sinda Du Height (inches): 66 Interpreting Physician: Phillips Odor MD, ABSM Weight (lbs): 214 RPSGT: Tametra, Ahart BMI: 35 MRN: [id number] Neck Size: 16.00 CLINICAL INFORMATION Sleep Study Type: NPSG     Indication for sleep study: Snoring     Epworth Sleepiness Score: 5     SLEEP STUDY TECHNIQUE As per the AASM Manual for the Scoring of Sleep and Associated Events v2.3 (April 2016) with a hypopnea requiring 4% desaturations.  The channels recorded and monitored were frontal, central and occipital EEG, electrooculogram (EOG), submentalis EMG (chin), nasal and oral airflow, thoracic and abdominal wall motion, anterior tibialis EMG, snore microphone, electrocardiogram, and pulse oximetry.  MEDICATIONS Medications self-administered by patient taken the night of the study : N/A  Current Outpatient Medications:  .  albuterol (PROVENTIL HFA;VENTOLIN HFA) 108 (90 Base) MCG/ACT inhaler, Inhale 2 puffs into the lungs every 6 (six) hours as needed., Disp: , Rfl:  .  amLODipine (NORVASC) 10 MG tablet, Take 1 tablet (10 mg total) by mouth daily., Disp: 30 tablet, Rfl: 6 .  aspirin-acetaminophen-caffeine (EXCEDRIN MIGRAINE) 250-250-65 MG tablet, Take 2 tablets by mouth daily as needed for migraine. , Disp: , Rfl:  .  atenolol (TENORMIN) 50 MG tablet, Take 1 tablet (50 mg total) by mouth daily., Disp: 30 tablet, Rfl: 6 .  chlorthalidone (HYGROTON) 25 MG tablet, Take 25 mg by mouth 2 (two) times daily. , Disp: , Rfl:  .  clopidogrel (PLAVIX) 75 MG tablet, Take 75 mg by mouth daily. PATIENT STATES SHES NOT TAKING, Disp: , Rfl:  .  dexlansoprazole (DEXILANT) 60 MG capsule, Take 1 tab by mouth every morning., Disp: 30  capsule, Rfl: 1 .  furosemide (LASIX) 20 MG tablet, Take 1 tablet (20 mg total) by mouth daily., Disp: 30 tablet, Rfl: 6 .  gabapentin (NEURONTIN) 600 MG tablet, Take 600 mg by mouth 4 (four) times daily., Disp: , Rfl:  .  hydrALAZINE (APRESOLINE) 50 MG tablet, Take 1 tablet (50 mg total) by mouth 3 (three) times daily., Disp: 90 tablet, Rfl: 6 .  losartan (COZAAR) 50 MG tablet, Take 50 mg by mouth 2 (two) times daily. , Disp: , Rfl: 3 .  potassium chloride SA (K-DUR,KLOR-CON) 20 MEQ tablet, Take 1 tablet (20 mEq total) by mouth daily., Disp: 30 tablet, Rfl: 6 .  rOPINIRole (REQUIP) 4 MG tablet, Take 4 mg by mouth at bedtime., Disp: , Rfl:  .  spironolactone (ALDACTONE) 100 MG tablet, Take 100 mg by mouth daily., Disp: , Rfl:  .  zolpidem (AMBIEN) 10 MG tablet, Take 10 mg by mouth at bedtime. , Disp: , Rfl:      SLEEP ARCHITECTURE The study was initiated at 9:58:13 PM and ended at 4:39:49 AM.  Sleep onset time was 12.3 minutes and the sleep efficiency was 91.5%. The total sleep time was 367.5 minutes.  Stage REM latency was 220.5 minutes.  The patient spent 5.17% of the night in stage N1 sleep, 65.71% in stage N2 sleep, 17.01% in stage N3 and 12.11% in REM.  Alpha intrusion was absent.  Supine sleep was 35.77%.  RESPIRATORY PARAMETERS The overall apnea/hypopnea index (AHI) was 2.1 per hour. There were 2 total apneas, including 2 obstructive, 0 central and 0  mixed apneas. There were 11 hypopneas and 4 RERAs.  The AHI during Stage REM sleep was 12.1 per hour.  AHI while supine was 0.9 per hour.  The mean oxygen saturation was 90.17%. The minimum SpO2 during sleep was 85.00%.  moderate snoring was noted during this study.  CARDIAC DATA The 2 lead EKG demonstrated sinus rhythm. The mean heart rate was N/A beats per minute. Other EKG findings include: None. LEG MOVEMENT DATA The total PLMS were 0 with a resulting PLMS index of 0.00. Associated arousal with leg movement index was  0.0.  IMPRESSIONS No significant obstructive sleep apnea occurred during this study. No significant central sleep apnea occurred during this study. Clinically significant periodic limb movements did not occur during sleep. No significant associated arousals.  Delano Metz, MD Diplomate, American Board of Sleep Medicine.  ELECTRONICALLY SIGNED ON:  05/16/2017, 10:15 AM [facility] PH: [phone]   FX: [phone] [place]

## 2017-05-16 NOTE — ED Provider Notes (Signed)
[facility] - Inpatient EMERGENCY DEPARTMENT Provider Note   CSN: [id number] Arrival date & time: 05/16/17  2027     History   Chief Complaint Chief Complaint  Patient presents with  . Chest Pain    HPI Katrina [name] is a 56 y.o. female.  Level 5 caveat for acuity of condition. Patient presents with gradual onset headache for the past several hours.  She is hypertensive to 214/114.  States she has chronic hypertension and her blood pressure normally runs high.  States compliance with her medications.  Patient also complains of intermittent left-sided chest pain since yesterday that comes and goes lasting for "a minute or 2" at a time that radiates to her left arm and back.  It was associated with some shortness of breath and diaphoresis.  She has had several episodes of diarrhea tonight and nausea but no vomiting.  No fever.  Patient noted to have left-sided weakness on exam which she states is "a little worse than usual".  History of 2 strokes in the past and she is on Plavix.  Code stroke activated on arrival.  Last seen normal uncertain.  Patient is a poor historian and does not seem to be affected by her weakness and she states is "not that bad".  Last known well questionably 8pm.   The history is provided by the patient.  Chest Pain   Associated symptoms include headaches, numbness, shortness of breath and weakness. Pertinent negatives include no abdominal pain, no dizziness, no fever, no nausea and no vomiting.  Pertinent negatives for past medical history include no seizures.    Past Medical History:  Diagnosis Date  . Allergy   . Anemia 1975-1976   . Anginal pain ([place])   . Anxiety    takes Valium daily as needed  . Arthritis    "spine" (12/03/2016)  . Asthma    has inhalers but doesn't use (12/03/2016)  . CAD (coronary artery disease)    Moderate nonobstructive 2012-2013, [place]; No angiographic evidence of CAD 01/31/15 [facility]  . Chronic bronchitis ([place])    "get it a couple times  q yr" (12/03/2016)  . Chronic kidney disease   . Chronic lower back pain    budlging disc   . Claustrophobia   . COPD (chronic obstructive pulmonary disease) ([place])   . Daily headache   . Depression   . Diverticulitis   . Family history of adverse reaction to anesthesia    2 daughters gets extremely sick   . GERD (gastroesophageal reflux disease)    takes Dexilant daily  . Heart murmur   . History of blood transfusion 1975-1976 "several"   no abnormal reaction noted  . History of colitis   . History of colon polyps    benign  . History of gastric ulcer   . History of hiatal hernia    "small one"  . History of MRSA infection 2017  . Hyperlipidemia    was on meds but has been off over a yr  . Hypertension    takes Amlodipine and Maxzide daily  . Insomnia    takes Ambien nightly  . Iron deficiency anemia    "when I was young"  . Lung nodules   . Microscopic hematuria   . Migraine    "2-3/wk" (12/03/2016)  . MS (multiple sclerosis) ([place])    questionable per pt  . Myocardial infarction ([place]) 2000   "mild one"  . Noncompliance   . Osteoporosis   . Peripheral neuropathy  weakness,numbness,and tingling. Takes Gabapentin daily  . Pneumonia 06/2015  . PTSD (post-traumatic stress disorder) dx'd 2016/2017   "was dx'd w/bipolar in 1991; replaced w/PTSD dx 2016/2017" (12/03/2016)  . Restless leg syndrome    takes Requip daily  . 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 Type II diabetes mellitus ([place])    "went off insulin in 2012/2013" (12/03/2016)    Patient Active Problem List   Diagnosis Date Noted  . Hypertension 12/03/2016  . Adverse food reaction 10/14/2016  . Pulmonary nodules 10/14/2016  . Non-seasonal allergic rhinitis due to fungal spores 10/14/2016  . Cigarette smoker 09/24/2016  . Cerebral microvascular disease 08/19/2016  . Non compliance with medical treatment 05/11/2016  . Leg  pain, anterior, right 05/11/2016  . Syncope 05/02/2016  . Type 2 diabetes mellitus with diabetic neuropathy ([place]) 05/02/2016  . Restless leg syndrome 05/02/2016  . Cerebrovascular accident (CVA) ([place])   . Vascular headache   . History of chest pain   . Generalized anxiety disorder   . Chronic bilateral low back pain without sciatica   . Peripheral neuropathy   . Migraine without status migrainosus, not intractable   . PTSD (post-traumatic stress disorder)   . History of cerebrovascular accident (CVA) with residual deficit   . CVA (cerebral vascular accident) ([place]) 04/03/2016  . Stroke (cerebrum) ([place]) 04/03/2016  . Post traumatic stress disorder 10/06/2015  . Precordial pain   . Migraine syndrome 01/24/2015  . Migraine 01/24/2015  . TIA (transient ischemic attack) 12/25/2014  . Hypertensive urgency   . Other chest pain   . Malignant hypertension   . Hypokalemia 04/16/2014  . Muscle weakness (generalized) 04/05/2014  . Stiffness of left hip joint 04/05/2014  . Pain in left hip 04/05/2014  . Diastolic dysfunction 78/29/5621  . Normal coronary arteries 03/20/2014  . PUD (peptic ulcer disease) 03/20/2014  . Type 2 diabetes, uncontrolled, with neuropathy ([place]) 03/20/2014  . Morbid obesity due to excess calories ([place]) 03/20/2014  . Left-sided weakness 03/20/2014  . Paresthesias 03/20/2014  . Cerebrovascular disease 03/20/2014  . Dyspnea on exertion 03/05/2012  . Tobacco abuse 03/05/2012  . Moderate COPD (chronic obstructive pulmonary disease) ([place]) 11/23/2011  . GERD (gastroesophageal reflux disease) 11/23/2011  . Uncontrolled hypertension 11/23/2011    Past Surgical History:  Procedure Laterality Date  . ABDOMINAL AORTOGRAM N/A 12/03/2016   Procedure: Abdominal Aortogram;  Surgeon: Angelia Mould, MD;  Location: [facility];  Service: Cardiovascular;  Laterality: N/A;  . ABDOMINAL HYSTERECTOMY  12/1987  . ADENOIDECTOMY  1975  . ANKLE SURGERY Bilateral 1993;  1995 X2   "stabilzation done; 1 on the right; 2 on the left"  . APPENDECTOMY  1989  . BACK SURGERY    . BREAST LUMPECTOMY Left    "benign tumor"  . CARDIAC CATHETERIZATION N/A 01/31/2015   Procedure: Left Heart Cath and Coronary Angiography;  Surgeon: Burnell Blanks, MD;  Location: [facility];  Service: Cardiovascular;  Laterality: N/A;  . CARDIAC CATHETERIZATION  2009; 2011  . COLONOSCOPY    . DILATION AND CURETTAGE OF UTERUS    . ESOPHAGOGASTRODUODENOSCOPY    . LAPAROSCOPIC CHOLECYSTECTOMY  1996  . NASAL RECONSTRUCTION  1976  . NASAL SINUS SURGERY  1975  . POSTERIOR LUMBAR FUSION  2005  . RADIOLOGY WITH ANESTHESIA N/A 01/15/2016   Procedure: MRI LUMBAR SPINE WITHOUT;  Surgeon: Medication Radiologist, MD;  Location: [facility];  Service: Radiology;  Laterality: N/A;  . RADIOLOGY WITH ANESTHESIA N/A 06/29/2016   Procedure: MRI OF THE BRAIN WITH AND WITHOUT;  Surgeon: Medication Radiologist, MD;  Location: [facility];  Service: Radiology;  Laterality: N/A;  . RENAL ANGIOGRAPHY N/A 12/03/2016   Procedure: Renal Angiography;  Surgeon: Angelia Mould, MD;  Location: [facility];  Service: Cardiovascular;  Laterality: N/A;  . TONSILLECTOMY  1992  . TUBAL LIGATION    . 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 Authorizing Provider  albuterol (PROVENTIL HFA;VENTOLIN HFA) 108 (90 Base) MCG/ACT inhaler Inhale 2 puffs into the lungs every 6 (six) hours as needed. 06/09/16 06/09/17  [provider]  amLODipine (NORVASC) 10 MG tablet Take 1 tablet (10 mg total) by mouth daily. 06/05/14   Herminio Commons, MD  aspirin-acetaminophen-caffeine (EXCEDRIN MIGRAINE) [phone] MG tablet Take 2 tablets by mouth daily as needed for migraine.     [provider]  atenolol  (TENORMIN) 50 MG tablet Take 1 tablet (50 mg total) by mouth daily. 05/02/17   Herminio Commons, MD  chlorthalidone (HYGROTON) 25 MG tablet Take 25 mg by mouth 2 (two) times daily.     [provider]  clopidogrel (PLAVIX) 75 MG tablet Take 75 mg by mouth daily. PATIENT STATES SHES NOT TAKING    [provider]  dexlansoprazole (DEXILANT) 60 MG capsule Take 1 tab by mouth every morning. 10/18/16   Esterwood, Amy S, PA-C  furosemide (LASIX) 20 MG tablet Take 1 tablet (20 mg total) by mouth daily. 05/02/17 07/31/17  Herminio Commons, MD  gabapentin (NEURONTIN) 600 MG tablet Take 600 mg by mouth 4 (four) times daily.    [provider]  hydrALAZINE (APRESOLINE) 50 MG tablet Take 1 tablet (50 mg total) by mouth 3 (three) times daily. 05/02/17   Herminio Commons, MD  losartan (COZAAR) 50 MG tablet Take 50 mg by mouth 2 (two) times daily.  06/09/16   [provider]  potassium chloride SA (K-DUR,KLOR-CON) 20 MEQ tablet Take 1 tablet (20 mEq total) by mouth daily. 05/02/17   Herminio Commons, MD  rOPINIRole (REQUIP) 4 MG tablet Take 4 mg by mouth at bedtime.    [provider]  spironolactone (ALDACTONE) 100 MG tablet Take 100 mg by mouth daily.    [provider]  zolpidem (AMBIEN) 10 MG tablet Take 10 mg by mouth at bedtime.  03/25/16   [provider]    Family History Family History  Problem Relation Age of Onset  . Coronary artery disease Father   . Emphysema Father   . Heart attack Father   . Stroke Father   . Cancer Father        Unsure of type   . Allergic rhinitis Father   . Depression Mother   . Cancer Mother        skin cancer  . Bipolar disorder Brother   . Drug abuse Brother   . Cancer Brother        leukemia  . Bipolar disorder Daughter   . Allergic rhinitis Daughter   . Asthma Daughter   . Urticaria Daughter   . Diabetes Maternal Grandmother   . Stomach  cancer Maternal Grandfather   . Cancer  Maternal Grandfather        stomach   . Angioedema Neg Hx   . Atopy Neg Hx   . Eczema Neg Hx   . Immunodeficiency Neg Hx     Social History Social History   Tobacco Use  . Smoking status: Current Every Day Smoker    Packs/day: 2.00    Years: 45.00    Pack years: 90.00    Types: Cigarettes    Start date: 03/04/1971  . Smokeless tobacco: Never Used  Substance Use Topics  . Alcohol use: No    Alcohol/week: 0.0 oz  . Drug use: No     Allergies   Other; Penicillins; Zithromax [azithromycin]; Aspirin; Pineapple; Strawberry extract; Wheat bran; Yeast-related products; Aspartame and phenylalanine; Mushroom extract complex; and Nicardipine   Review of Systems Review of Systems  Constitutional: Negative for activity change, appetite change and fever.  Respiratory: Positive for chest tightness and shortness of breath.   Cardiovascular: Positive for chest pain.  Gastrointestinal: Positive for diarrhea. Negative for abdominal pain, nausea and vomiting.  Genitourinary: Negative for dysuria, hematuria, vaginal bleeding and vaginal discharge.  Musculoskeletal: Negative for arthralgias, myalgias and neck pain.  Skin: Negative for rash.  Neurological: Positive for facial asymmetry, weakness, numbness and headaches. Negative for dizziness and seizures.   all other systems are negative except as noted in the HPI and PMH.     Physical Exam Updated Vital Signs BP (!) 214/114   Pulse 91   Temp 97.9 F (36.6 C) (Oral)   Resp 20   Ht 5\' 6"  (1.676 m)   Wt 97.5 kg (215 lb)   SpO2 100%   BMI 34.70 kg/m   Physical Exam  Constitutional: She is oriented to person, place, and time. She appears well-developed and well-nourished. No distress.  HENT:  Head: Normocephalic and atraumatic.  Mouth/Throat: Oropharynx is clear and moist. No oropharyngeal exudate.  Eyes: Conjunctivae and EOM are normal. Pupils are equal, round, and reactive to light.  Neck: Normal range of motion. Neck supple.    No meningismus.  Cardiovascular: Normal rate, regular rhythm, normal heart sounds and intact distal pulses.  No murmur heard. Pulmonary/Chest: Effort normal and breath sounds normal. No respiratory distress. She exhibits no tenderness.  Abdominal: Soft. There is no tenderness. There is no rebound and no guarding.  Musculoskeletal: Normal range of motion. She exhibits no edema or tenderness.  Neurological: She is alert and oriented to person, place, and time. A cranial nerve deficit is present. She exhibits normal muscle tone. Coordination normal.  Left-sided facial droop, tongue deviation to the right 4/5 strength of left upper extremity and 4/5 strength of left lower extremity Difficulty holding left arm up against gravity.  Positive pronator drift.  Skin: Skin is warm.  Psychiatric: She has a normal mood and affect. Her behavior is normal.  Nursing note and vitals reviewed.    ED Treatments / Results  Labs (all labs ordered are listed, but only abnormal results are displayed) Labs Reviewed  CBC WITH DIFFERENTIAL/PLATELET - Abnormal; Notable for the following components:      Result Value   WBC 12.9 (*)    Lymphs Abs 5.8 (*)    All other components within normal limits  COMPREHENSIVE METABOLIC PANEL - Abnormal; Notable for the following components:   Glucose, Bld 150 (*)    BUN 25 (*)    Creatinine, Ser 1.04 (*)    Total Bilirubin 0.2 (*)  GFR calc non Af Amer 59 (*)    All other components within normal limits  I-STAT CHEM 8, ED - Abnormal; Notable for the following components:   BUN 23 (*)    Glucose, Bld 149 (*)    All other components within normal limits  I-STAT BETA HCG BLOOD, ED ([facility], [facility], [facility] ONLY) - Abnormal; Notable for the following components:   I-stat hCG, quantitative 14.0 (*)    All other components within normal limits  CBG MONITORING, ED - Abnormal; Notable for the following components:   Glucose-Capillary 145 (*)    All other components within normal  limits  TROPONIN I  BRAIN NATRIURETIC PEPTIDE  D-DIMER, QUANTITATIVE (NOT AT [facility])  LIPASE, BLOOD  ETHANOL  PROTIME-INR  APTT  URINALYSIS, ROUTINE W REFLEX MICROSCOPIC  RAPID URINE DRUG SCREEN, HOSP PERFORMED  I-STAT TROPONIN, ED    EKG  EKG Interpretation  Date/Time:  Monday May 16 2017 20:43:34 EST Ventricular Rate:  93 PR Interval:    QRS Duration: 93 QT Interval:  383 QTC Calculation: 477 R Axis:   61 Text Interpretation:  Sinus rhythm Consider right atrial enlargement No significant change was found Confirmed by Ezequiel Essex [phone]) on 05/16/2017 8:46:50 PM       Radiology Ct Angio Head W Or Wo Contrast  Result Date: 05/16/2017 CLINICAL DATA:  Sudden onset left-sided weakness EXAM: CT ANGIOGRAPHY HEAD AND NECK TECHNIQUE: Multidetector CT imaging of the head and neck was performed using the standard protocol during bolus administration of intravenous contrast. Multiplanar CT image reconstructions and MIPs were obtained to evaluate the vascular anatomy. Carotid stenosis measurements (when applicable) are obtained utilizing NASCET criteria, using the distal internal carotid diameter as the denominator. CONTRAST:  164mL ISOVUE-370 IOPAMIDOL (ISOVUE-370) INJECTION 76% COMPARISON:  Head CT 05/16/2017 Head CT 04/25/2017 FINDINGS: CTA NECK FINDINGS Aortic arch: There is minimal calcific atherosclerosis of the aortic arch. There is no aneurysm, dissection or hemodynamically significant stenosis of the visualized ascending aorta and aortic arch. Conventional 3 vessel aortic branching pattern. The visualized proximal subclavian arteries are normal. Right carotid system: The right common carotid origin is widely patent. There is no common carotid or internal carotid artery dissection or aneurysm. Atherosclerotic calcification at the carotid bifurcation without hemodynamically significant stenosis. Left carotid system: The left common carotid origin is widely patent. There is no  common carotid or internal carotid artery dissection or aneurysm. Atherosclerotic calcification at the carotid bifurcation without hemodynamically significant stenosis. Vertebral arteries: The vertebral system is right dominant. The left vertebral artery is diminutive along its entire course and its origin is not visible. Minimal opacification begins at the C4 level. The remainder of the left vertebral artery is severely stenotic, worsened from the CTA obtained 10/01/2015. The right vertebral artery is normal to its confluence with the basilar artery. Skeleton: There is no bony spinal canal stenosis. No lytic or blastic lesions. Other neck: The nasopharynx is clear. The oropharynx and hypopharynx are normal. The epiglottis is normal. The supraglottic larynx, glottis and subglottic larynx are normal. No retropharyngeal collection. The parapharyngeal spaces are preserved. The parotid and submandibular glands are normal. No sialolithiasis or salivary ductal dilatation. The thyroid gland is normal. There is no cervical lymphadenopathy. Upper chest: No pneumothorax or pleural effusion. No nodules or masses. Review of the MIP images confirms the above findings CTA HEAD FINDINGS Anterior circulation: --Intracranial internal carotid arteries: Normal. --Anterior cerebral arteries: Normal. --Middle cerebral arteries: Patent. Mild atherosclerotic narrowing of the right middle cerebral artery without hemodynamically  significant stenosis. Normal left MCA. --Posterior communicating arteries: Present on the right, absent on the left. Posterior circulation: --Posterior cerebral arteries: Normal. --Superior cerebellar arteries: Normal. --Basilar artery: Normal. --Anterior inferior cerebellar arteries: Normal. --Posterior inferior cerebellar arteries: Normal. Venous sinuses: As permitted by contrast timing, patent. Anatomic variants: None Delayed phase: No parenchymal contrast enhancement. Review of the MIP images confirms the above  findings IMPRESSION: 1. No emergent intracranial large vessel occlusion. 2. 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 This is probably a chronic finding and would not account for the reported left-sided weakness. The intracranial posterior circulation is entirely patent. 3. Mild bilateral carotid bifurcation atherosclerotic calcification without hemodynamically significant stenosis. Aortic Atherosclerosis (ICD10-I70.0). These results were called by telephone at the time of interpretation on 05/16/2017 at 10:13 pm to Dr. Criss Rosales, who verbally acknowledged these results. Electronically Signed   By: Ulyses Jarred M.D.   On: 05/16/2017 22:13   Dg Chest 1 View  Result Date: 05/16/2017 CLINICAL DATA:  Chest pain EXAM: CHEST 1 VIEW COMPARISON:  04/17/2017 FINDINGS: The heart size and mediastinal contours are within normal limits. Both lungs are clear. The visualized skeletal structures are unremarkable. IMPRESSION: No active disease. Electronically Signed   By: Inez Catalina M.D.   On: 05/16/2017 21:53   Ct Angio Neck W And/or Wo Contrast  Result Date: 05/16/2017 CLINICAL DATA:  Sudden onset left-sided weakness EXAM: CT ANGIOGRAPHY HEAD AND NECK TECHNIQUE: Multidetector CT imaging of the head and neck was performed using the standard protocol during bolus administration of intravenous contrast. Multiplanar CT image reconstructions and MIPs were obtained to evaluate the vascular anatomy. Carotid stenosis measurements (when applicable) are obtained utilizing NASCET criteria, using the distal internal carotid diameter as the denominator. CONTRAST:  140mL ISOVUE-370 IOPAMIDOL (ISOVUE-370) INJECTION 76% COMPARISON:  Head CT 05/16/2017 Head CT 04/25/2017 FINDINGS: CTA NECK FINDINGS Aortic arch: There is minimal calcific atherosclerosis of the aortic arch. There is no aneurysm, dissection or hemodynamically significant stenosis of the visualized  ascending aorta and aortic arch. Conventional 3 vessel aortic branching pattern. The visualized proximal subclavian arteries are normal. Right carotid system: The right common carotid origin is widely patent. There is no common carotid or internal carotid artery dissection or aneurysm. Atherosclerotic calcification at the carotid bifurcation without hemodynamically significant stenosis. Left carotid system: The left common carotid origin is widely patent. There is no common carotid or internal carotid artery dissection or aneurysm. Atherosclerotic calcification at the carotid bifurcation without hemodynamically significant stenosis. Vertebral arteries: The vertebral system is right dominant. The left vertebral artery is diminutive along its entire course and its origin is not visible. Minimal opacification begins at the C4 level. The remainder of the left vertebral artery is severely stenotic, worsened from the CTA obtained 10/01/2015. The right vertebral artery is normal to its confluence with the basilar artery. Skeleton: There is no bony spinal canal stenosis. No lytic or blastic lesions. Other neck: The nasopharynx is clear. The oropharynx and hypopharynx are normal. The epiglottis is normal. The supraglottic larynx, glottis and subglottic larynx are normal. No retropharyngeal collection. The parapharyngeal spaces are preserved. The parotid and submandibular glands are normal. No sialolithiasis or salivary ductal dilatation. The thyroid gland is normal. There is no cervical lymphadenopathy. Upper chest: No pneumothorax or pleural effusion. No nodules or masses. Review of the MIP images confirms the above findings CTA HEAD FINDINGS Anterior circulation: --Intracranial internal carotid arteries: Normal. --Anterior cerebral arteries: Normal. --Middle cerebral arteries: Patent. Mild  atherosclerotic narrowing of the right middle cerebral artery without hemodynamically significant stenosis. Normal left MCA.  --Posterior communicating arteries: Present on the right, absent on the left. Posterior circulation: --Posterior cerebral arteries: Normal. --Superior cerebellar arteries: Normal. --Basilar artery: Normal. --Anterior inferior cerebellar arteries: Normal. --Posterior inferior cerebellar arteries: Normal. Venous sinuses: As permitted by contrast timing, patent. Anatomic variants: None Delayed phase: No parenchymal contrast enhancement. Review of the MIP images confirms the above findings IMPRESSION: 1. No emergent intracranial large vessel occlusion. 2. 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 This is probably a chronic finding and would not account for the reported left-sided weakness. The intracranial posterior circulation is entirely patent. 3. Mild bilateral carotid bifurcation atherosclerotic calcification without hemodynamically significant stenosis. Aortic Atherosclerosis (ICD10-I70.0). These results were called by telephone at the time of interpretation on 05/16/2017 at 10:13 pm to Dr. Criss Rosales, who verbally acknowledged these results. Electronically Signed   By: Ulyses Jarred M.D.   On: 05/16/2017 22:13   Ct Head Code Stroke Wo Contrast`  Result Date: 05/16/2017 CLINICAL DATA:  Code stroke.  Sudden onset left-sided weakness EXAM: CT HEAD WITHOUT CONTRAST TECHNIQUE: Contiguous axial images were obtained from the base of the skull through the vertex without intravenous contrast. COMPARISON:  Head CT 04/25/2017 FINDINGS: Brain: No mass lesion or acute hemorrhage. No focal hypoattenuation of the basal ganglia or cortex to indicate infarcted tissue. No hydrocephalus or age advanced atrophy. Vascular: No hyperdense vessel. No advanced atherosclerotic calcification of the arteries at the skull base. Skull: Normal visualized skull base, calvarium and extracranial soft tissues. Sinuses/Orbits: No sinus fluid levels or advanced mucosal  thickening. No mastoid effusion. Normal orbits. ASPECTS [facility] Stroke Program Early CT Score) - Ganglionic level infarction (caudate, lentiform nuclei, internal capsule, insula, M1-M3 cortex): 7 - Supraganglionic infarction (M4-M6 cortex): 3 Total score (0-10 with 10 being normal): 10 IMPRESSION: 1. Normal head CT. 2. ASPECTS is 10. These results were called by telephone at the time of interpretation on 05/16/2017 at 9:16 pm to Dr. Ezequiel Essex , who verbally acknowledged these results. Electronically Signed   By: Ulyses Jarred M.D.   On: 05/16/2017 21:16    Procedures Procedures (including critical care time)  Medications Ordered in ED Medications - No data to display   Initial Impression / Assessment and Plan / ED Course  I have reviewed the triage vital signs and the nursing notes.  Pertinent labs & imaging results that were available during my care of the patient were reviewed by me and considered in my medical decision making (see chart for details).    Patient presents with gradual onset headache and hypertension over the past several hours.  Mentions intermittent left-sided chest pain since yesterday that comes and goes lasting for several minutes at a time.  EEG is nonischemic.  On exam, patient has significant left-sided weakness that she reports is "a little worse than usual" and it "comes and goes".  Unclear last seen normal but patient states headache happened about 4 hours ago.  Code stroke activated.  CT head negative.  Discussed with tele-neurologist Dr. Criss Rosales at bedside.  Patient is unclear on her last seen normal. She felt that her left leg was weak while walking to the exam room.  He does not recommend TPA.  Recommends emergent CTA to identify large vessel occlusion.  Patient may be candidate for mechanical thrombectomy if she does have a large vessel occlusion.  D/w teleneurology Dr. Criss Rosales.  Patient has  no large vessel occlusion.  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  This would not account for her left-sided weakness. Dr Criss Rosales recommends admission for stroke workup. Patient is not a candidate for tPA or mechanical thrombectomy.  Patient will be admitted for stroke workup.  Her chest pain is atypical for ACS and first troponin is negative.  D-dimer is negative. Remains hypertensive.  IV hydralazine ordered.  States compliance with her blood pressure medications.  Blood pressure improved to 189/85 before hydralazine given.  Hydralazine was canceled.  We will allow permissive hypertension in case patient is actually having a new stroke.  Admission discussed with Dr. Myna Hidalgo.  CRITICAL CARE Performed by: Ezequiel Essex Total critical care time: 45 minutes Critical care time was exclusive of separately billable procedures and treating other patients. Critical care was necessary to treat or prevent imminent or life-threatening deterioration. Critical care was time spent personally by me on the following activities: development of treatment plan with patient and/or surrogate as well as nursing, discussions with consultants, evaluation of patient's response to treatment, examination of patient, obtaining history from patient or surrogate, ordering and performing treatments and interventions, ordering and review of laboratory studies, ordering and review of radiographic studies, pulse oximetry and re-evaluation of patient's condition.  Final Clinical Impressions(s) / ED Diagnoses   Final diagnoses:  Left-sided weakness  Atypical chest pain    ED Discharge Orders    None       Chairty Toman, Annie Main, MD 05/16/17 2238

## 2017-05-16 NOTE — ED Notes (Signed)
CT aware of pt at 2055, tele neurology beeped at 2055, pt transported to CT at this time.

## 2017-05-16 NOTE — ED Triage Notes (Signed)
Pt with intermittent cp since yesterday on left.  Denies cp at present.  Diaphoresis at times, diarrhea started today, Nausea but denies vomiting.

## 2017-05-16 NOTE — Consult Note (Signed)
[facility] TeleNeurology Consult Services    Impression:    RO Acute Ischemic Stroke    - Presentation suggestive of right hemispheric infarct.  There is no radiographic indication of LVO disease.  Please see recommendations below.      Differential Diagnosis:    1. Cardioembolic stroke    2. Small vessel disease/lacune    3. Thromboembolic, artery-to-artery mechanism    4. Hypercoagulable state-related infarct    5. Transient ischemic attack    6. Thrombotic mechanism, large artery disease       Comments:   Arrival Time: 2027 [facility] contacted:  [phone] [facility] first log in attempt : 2114        Recommendations:    MRI head in AM  Lipid panel  DVT prophylaxis  PT OT ST  Neurology to see in am for follow up.   Antiplatelet therapy may be initiated.   Patient is not considered candidate for tPA thrombolytics, since time of onset is now clear and cannot be determined on the basis of history.  CTA head and neck showed no indication of large vessel acute thrombotic disease and she is not candidate for thrombectomy.   Discussed above with ED physician.  Please call with questions.  Burnett Harry, MD [facility] [phone]    -----------------------------------------------------------------------------------------   History of Present Illness     Patient is a? 56 y.o.woman who presents with history of headache as chief complaint.  She states that the headache started about 2 hours ago.  She also states that she has has left sided weakness, but it absolutely unable to tell me time of onset.  She believes that it was started sometime today.  She states that she has had old left visual field deficit.   The headache is described as left retro-orbital and unilateral cranial pain.  She has been chronically hypertensive and presents now with uncontrolled hypertension systolic in the range of 222L.  She describes having  intermittent left chest pain.  She has had history of prior stroke which she states left her with left sided visual field deficit.       Exam:    1A: Level of Consciousness - Alert; keenly responsive 1B: Ask Month and Age - Both Questions Right 1C: 'Blink Eyes' & 'Squeeze Hands' - Performs Both Tasks 2: Test Horizontal Extraocular Movements - Normal 3: Test Visual Fields - Complete Hemianopia 4: Test Facial Palsy - Partial paralysis (lower face) 5A: Test Left Arm Motor Drift - Drift, but doesn't hit bed 5B: Test Right Arm Motor Drift - No Drift for 10 Seconds 6A: Test Left Leg Motor Drift - Drift, but doesn't hit bed 6B: Test Right Leg Motor Drift - No Drift for 5 Seconds 7: Test Limb Ataxia - No Ataxia 8: Test Sensation - Mild-Moderate Loss: Less Sharp/More Dull 9: Test Language/Aphasia - Normal; No aphasia 10: Test Dysarthria - Mild-Moderate Dysarthria: Slurring but can be understood 11: Test Extinction/Inattention - Visual/tactile/auditory/spatial/personal inattention NIHSS score: 9    No current facility-administered medications on file prior to encounter.    Current Outpatient Medications on File Prior to Encounter  Medication Sig Dispense Refill  . albuterol (PROVENTIL HFA;VENTOLIN HFA) 108 (90 Base) MCG/ACT inhaler Inhale 2 puffs into the lungs every 6 (six) hours as needed.    Marland Kitchen amLODipine (NORVASC) 10 MG tablet Take 1 tablet (10 mg total) by mouth daily. 30 tablet 6  . aspirin-acetaminophen-caffeine (EXCEDRIN MIGRAINE) 250-250-65 MG tablet Take 2  tablets by mouth daily as needed for migraine.     Marland Kitchen atenolol (TENORMIN) 50 MG tablet Take 1 tablet (50 mg total) by mouth daily. 30 tablet 6  . celecoxib (CELEBREX) 200 MG capsule Take 200 mg by mouth at bedtime.  3  . chlorthalidone (HYGROTON) 25 MG tablet Take 25 mg by mouth 2 (two) times daily.     . clopidogrel (PLAVIX) 75 MG tablet Take 75 mg by mouth daily.     Marland Kitchen dexlansoprazole (DEXILANT) 60 MG capsule Take 1 tab by  mouth every morning. (Patient taking differently: Take 60 mg by mouth every evening. ) 30 capsule 1  . furosemide (LASIX) 20 MG tablet Take 1 tablet (20 mg total) by mouth daily. 30 tablet 6  . gabapentin (NEURONTIN) 600 MG tablet Take 600 mg by mouth 4 (four) times daily.    . hydrALAZINE (APRESOLINE) 50 MG tablet Take 1 tablet (50 mg total) by mouth 3 (three) times daily. 90 tablet 6  . losartan (COZAAR) 50 MG tablet Take 50 mg by mouth 2 (two) times daily.   3  . methocarbamol (ROBAXIN) 500 MG tablet Take 1 tablet by mouth every 6 (six) hours as needed for muscle spasms.   0  . potassium chloride SA (K-DUR,KLOR-CON) 20 MEQ tablet Take 1 tablet (20 mEq total) by mouth daily. 30 tablet 6  . rOPINIRole (REQUIP) 4 MG tablet Take 4 mg by mouth at bedtime.    Marland Kitchen spironolactone (ALDACTONE) 100 MG tablet Take 100 mg by mouth daily.    Marland Kitchen zolpidem (AMBIEN CR) 12.5 MG CR tablet Take 12.5 mg by mouth at bedtime.     Past Medical History:  Diagnosis Date  . Allergy   . Anemia 1975-1976   . Anginal pain ([place])   . Anxiety    takes Valium daily as needed  . Arthritis    "spine" (12/03/2016)  . Asthma    has inhalers but doesn't use (12/03/2016)  . CAD (coronary artery disease)    Moderate nonobstructive 2012-2013, [place]; No angiographic evidence of CAD 01/31/15 [facility]  . Chronic bronchitis ([place])    "get it a couple times q yr" (12/03/2016)  . Chronic kidney disease   . Chronic lower back pain    budlging disc   . Claustrophobia   . COPD (chronic obstructive pulmonary disease) ([place])   . Daily headache   . Depression   . Diverticulitis   . Family history of adverse reaction to anesthesia    2 daughters gets extremely sick   . GERD (gastroesophageal reflux disease)    takes Dexilant daily  . Heart murmur   . History of blood transfusion 1975-1976 "several"   no abnormal reaction noted  . History of colitis   . History of colon polyps    benign  . History of gastric ulcer   . History of hiatal  hernia    "small one"  . History of MRSA infection 2017  . Hyperlipidemia    was on meds but has been off over a yr  . Hypertension    takes Amlodipine and Maxzide daily  . Insomnia    takes Ambien nightly  . Iron deficiency anemia    "when I was young"  . Lung nodules   . Microscopic hematuria   . Migraine    "2-3/wk" (12/03/2016)  . MS (multiple sclerosis) ([place])    questionable per pt  . Myocardial infarction ([place]) 2000   "mild one"  . Noncompliance   .  Osteoporosis   . Peripheral neuropathy    weakness,numbness,and tingling. Takes Gabapentin daily  . Pneumonia 06/2015  . PTSD (post-traumatic stress disorder) dx'd 2016/2017   "was dx'd w/bipolar in 1991; replaced w/PTSD dx 2016/2017" (12/03/2016)  . Restless leg syndrome    takes Requip daily  . 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 Type II diabetes mellitus ([place])    "went off insulin in 2012/2013" (12/03/2016)       Medical Decision Making:    - Extensive number of diagnosis or management options are considered above.   - Extensive amount of complex data reviewed.   - High risk of complication and/or morbidity or mortality are associated with differential diagnostic considerations above.    - There may be Uncertain outcome and increased probability of prolonged functional impairment or high probability of severe prolonged functional impairment associated with some of these differential diagnosis.    Medical Data Reviewed:    1.Data reviewed include clinical labs, radiology,Medical Tests;   2.Tests results discussed w/performing or interpreting physician;   3.Obtaining/reviewing old medical records;    4.Obtaining case history from another source;    5.Independent review of image, tracing or specimen.          Patient was informed the Neurology Consult would happen via telehealth (remote video) and consented to receiving  care in this manner.

## 2017-05-17 ENCOUNTER — Observation Stay (HOSPITAL_COMMUNITY): Payer: Medicare Other

## 2017-05-17 ENCOUNTER — Other Ambulatory Visit: Payer: Self-pay

## 2017-05-17 ENCOUNTER — Observation Stay (HOSPITAL_BASED_OUTPATIENT_CLINIC_OR_DEPARTMENT_OTHER): Payer: Medicare Other

## 2017-05-17 DIAGNOSIS — I10 Essential (primary) hypertension: Secondary | ICD-10-CM | POA: Diagnosis not present

## 2017-05-17 DIAGNOSIS — R0789 Other chest pain: Secondary | ICD-10-CM

## 2017-05-17 DIAGNOSIS — E1140 Type 2 diabetes mellitus with diabetic neuropathy, unspecified: Secondary | ICD-10-CM

## 2017-05-17 DIAGNOSIS — R531 Weakness: Secondary | ICD-10-CM | POA: Diagnosis not present

## 2017-05-17 DIAGNOSIS — E1165 Type 2 diabetes mellitus with hyperglycemia: Secondary | ICD-10-CM

## 2017-05-17 LAB — ECHOCARDIOGRAM COMPLETE
HEIGHTINCHES: 66 in
WEIGHTICAEL: 3463.87 [oz_av]

## 2017-05-17 LAB — LIPID PANEL
Cholesterol: 123 mg/dL (ref 0–200)
HDL: 34 mg/dL — AB (ref >40)
LDL CALC: 69 mg/dL (ref 0–99)
Total CHOL/HDL Ratio: 3.6 RATIO
Triglycerides: 98 mg/dL (ref <150)
VLDL: 20 mg/dL (ref 0–40)

## 2017-05-17 LAB — HEMOGLOBIN A1C
HEMOGLOBIN A1C: 5.9 % — AB (ref 4.8–5.6)
MEAN PLASMA GLUCOSE: 122.63 mg/dL

## 2017-05-17 LAB — TROPONIN I: Troponin I: <0.03 ng/mL (ref <0.03)

## 2017-05-17 MED ORDER — ENOXAPARIN SODIUM 40 MG/0.4ML ~~LOC~~ SOLN
40.0000 mg | SUBCUTANEOUS | Status: DC
  Filled 2017-05-17 (×2): qty 0.4

## 2017-05-17 MED ORDER — DIPHENHYDRAMINE HCL 50 MG/ML IJ SOLN
25.0000 mg | Freq: Once | INTRAMUSCULAR | Status: AC
  Administered 2017-05-18: 25 mg via INTRAVENOUS
  Filled 2017-05-17: qty 1

## 2017-05-17 MED ORDER — LORAZEPAM 2 MG/ML IJ SOLN
2.0000 mg | Freq: Once | INTRAMUSCULAR | Status: AC
  Administered 2017-05-17: 2 mg via INTRAVENOUS
  Filled 2017-05-17: qty 1

## 2017-05-17 MED ORDER — ALPRAZOLAM 1 MG PO TABS
1.0000 mg | ORAL_TABLET | Freq: Once | ORAL | Status: AC
Start: 2017-05-18 — End: 2017-05-18
  Administered 2017-05-18: 1 mg via ORAL
  Filled 2017-05-17: qty 1

## 2017-05-17 NOTE — Progress Notes (Signed)
Code stroke Phone call from ER  858pm Beeper    [address] [place]   [address]

## 2017-05-17 NOTE — Progress Notes (Signed)
Patient states that she has left AMA before and will be leaving after shift change today 05/17/2017. Patient is not compliant with most protocols/orders of physician and facility.  Patient refuses use of hospital bed Patient is in the recliner. Patient is in no obvious or stated distress. Patient accepted lab draw Patient refuses some meds and radiology tests. Physician is aware and has seen patient.

## 2017-05-17 NOTE — Progress Notes (Signed)
OT Cancellation Note  Patient Details Name: TERESA NICODEMUS MRN: [id number] DOB: 1960-12-17   Cancelled Treatment:    Reason Eval/Treat Not Completed: Patient declined, no reason specified. Pt refusing all services at this time. Will attempt evaluation at a later time.   Guadelupe Sabin, OTR/L  [phone] 05/17/2017, 9:06 AM

## 2017-05-17 NOTE — Progress Notes (Signed)
Patient is refusing to comply with various hospital protocols and procedures at this time.

## 2017-05-17 NOTE — Progress Notes (Signed)
Pt is noncompliant with various protocols such as BP and some monitoring/medications.

## 2017-05-17 NOTE — Progress Notes (Signed)
Patient is refusing to get MRI, Dr. Carles Collet into talk to patient about benefits.

## 2017-05-17 NOTE — Progress Notes (Signed)
Patient transferred to 300 dept, report given to RN.

## 2017-05-17 NOTE — Consult Note (Signed)
[place] A. Merlene Laughter, MD     [email or web address]          Katrina [name] is an 56 y.o. female.   ASSESSMENT/PLAN: 1. Recurrent episodes of left-sided numbness, weakness and tingling of unclear etiology: Depression diagnosis includes complicated migraine headache/my headaches with aura, multiple sclerosis, psychogenic disorders, complications from elevated blood pressure and ischemic stroke. Ischemic stroke is less likely however given the the recurrent nature and negative imaging. Physical examination showed evidence that suggest organic weakness but with evidence of none physiologic/nonanatomic overlay. A repeat MRI of the brain without contrast will be obtained. This will be done without without contrast to assess for multiple sclerosis specifically. IV Ativan 2 mg was given early this morning for attempted MRI but failed because of severe PTSD and anxiety. This should be repeated with different medications this time around.   Brain MRA or MRA of the neck is not warranted given the patient has had a CTA. An EEG will be obtained. Spinal tap was entertained but she is on Plavix and this is unlikely to be done during this hospitalization. This is something that think should be done to help with diagnosis later states in the outpatient setting.      This is a 56 year old white female who has a long-standing history of left-sided episodic nose and weakness. She's had multiple imaging without clear diagnosis to explain her symptoms. She tells me that these episodes have been occurring more frequently. She appears of had residual mild left-sided weakness. The spells again are clear more frequent and are lasting for about 2-3 days when the occur. She tells me that sometimes the right side also becomes weak. The most recent symptoms that occurred yesterday was associated with significant tachycardia/palpitation, elevated blood pressure and severe headaches. Her blood pressure was  quite accelerated on presentation with a systolic in the 937J and diastolic in the one teens. It appears her symptoms were associated with significant left-sided chest discomfort. Cardiac enzymes have been negative however. She does not appears to have loss of consciousness or clonic activities however. She has had one EEG done 3 years ago which was negative. Repeat MRI done early this year showed no progression of white matter lesions. She's had some neck and low back pain and was self to have epidural injections for this later this week. The review systems otherwise negative.    GENERAL: This a pleasant lady who is in no acute distress.  HEENT: Supple neck and no trauma appreciated.  ABDOMEN: soft  EXTREMITIES: No edema   BACK: This is normal.  SKIN: Normal by inspection.    MENTAL STATUS: Alert and oriented. Speech, language and cognition are generally intact. Judgment and insight normal.   CRANIAL NERVES: Pupils are equal, round and reactive to light and accomodation; extra ocular movements are full, there is no significant nystagmus; visual fields are full; she has a more significant weakness of the left lower facial muscles. Interestingly, tongue deviates towards the right - away from the weak side which is different than expected, uvula is somewhat midline; shoulder shrug is weak on the left  MOTOR: Right deltoid 4+/5, right hand grip 5. Right hip flexion and dorsiflexion 5/5. Left deltoid 4 minus, left tricep 4 minus with clear evidence of giveaway weakness, hand grip 5. Left hip flexion 2/5 in dorsiflexion 3.  COORDINATION: No dysmetria or tremors are noted on the right side. No clear dysmetria or tremors on the left side.  REFLEXES: Deep tendon reflexes are  symmetrical and normal. Babinski reflexes are flexor bilaterally.   SENSATION: This is diminished to light touch and temperature involving the left upper extremity and left leg.       Blood pressure (!) 201/91, pulse 75,  temperature 98.2 F (36.8 C), temperature source Oral, resp. rate 18, height '5\' 6"'  (1.676 m), weight 216 lb 7.9 oz (98.2 kg), SpO2 99 %.  Past Medical History:  Diagnosis Date  . Allergy   . Anemia 1975-1976   . Anginal pain ([place])   . Anxiety    takes Valium daily as needed  . Arthritis    "spine" (12/03/2016)  . Asthma    has inhalers but doesn't use (12/03/2016)  . CAD (coronary artery disease)    Moderate nonobstructive 2012-2013, [place]; No angiographic evidence of CAD 01/31/15 [facility]  . Chronic bronchitis ([place])    "get it a couple times q yr" (12/03/2016)  . Chronic kidney disease   . Chronic lower back pain    budlging disc   . Claustrophobia   . COPD (chronic obstructive pulmonary disease) ([place])   . Daily headache   . Depression   . Diverticulitis   . Family history of adverse reaction to anesthesia    2 daughters gets extremely sick   . GERD (gastroesophageal reflux disease)    takes Dexilant daily  . Heart murmur   . History of blood transfusion 1975-1976 "several"   no abnormal reaction noted  . History of colitis   . History of colon polyps    benign  . History of gastric ulcer   . History of hiatal hernia    "small one"  . History of MRSA infection 2017  . Hyperlipidemia    was on meds but has been off over a yr  . Hypertension    takes Amlodipine and Maxzide daily  . Insomnia    takes Ambien nightly  . Iron deficiency anemia    "when I was young"  . Lung nodules   . Microscopic hematuria   . Migraine    "2-3/wk" (12/03/2016)  . MS (multiple sclerosis) ([place])    questionable per pt  . Myocardial infarction ([place]) 2000   "mild one"  . Noncompliance   . Osteoporosis   . Peripheral neuropathy    weakness,numbness,and tingling. Takes Gabapentin daily  . Pneumonia 06/2015  . PTSD (post-traumatic stress disorder) dx'd 2016/2017   "was dx'd w/bipolar in 1991; replaced w/PTSD dx 2016/2017" (12/03/2016)  . Restless leg syndrome    takes Requip daily  .  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 Type II diabetes mellitus ([place])    "went off insulin in 2012/2013" (12/03/2016)    Past Surgical History:  Procedure Laterality Date  . ABDOMINAL AORTOGRAM N/A 12/03/2016   Procedure: Abdominal Aortogram;  Surgeon: Angelia Mould, MD;  Location: [facility];  Service: Cardiovascular;  Laterality: N/A;  . ABDOMINAL HYSTERECTOMY  12/1987  . ADENOIDECTOMY  1975  . ANKLE SURGERY Bilateral 1993; 1995 X2   "stabilzation done; 1 on the right; 2 on the left"  . APPENDECTOMY  1989  . BACK SURGERY    . BREAST LUMPECTOMY Left    "benign tumor"  . CARDIAC CATHETERIZATION N/A 01/31/2015   Procedure: Left Heart Cath and Coronary Angiography;  Surgeon: Burnell Blanks, MD;  Location: [facility];  Service: Cardiovascular;  Laterality: N/A;  . CARDIAC CATHETERIZATION  2009; 2011  . COLONOSCOPY    . DILATION AND CURETTAGE OF UTERUS    . ESOPHAGOGASTRODUODENOSCOPY    . LAPAROSCOPIC CHOLECYSTECTOMY  1996  . NASAL RECONSTRUCTION  1976  . NASAL SINUS SURGERY  1975  . POSTERIOR LUMBAR FUSION  2005  . RADIOLOGY WITH ANESTHESIA N/A 01/15/2016   Procedure: MRI LUMBAR SPINE WITHOUT;  Surgeon: Medication Radiologist, MD;  Location: [facility];  Service: Radiology;  Laterality: N/A;  . RADIOLOGY WITH ANESTHESIA N/A 06/29/2016   Procedure: MRI OF THE BRAIN WITH AND WITHOUT;  Surgeon: Medication Radiologist, MD;  Location: [facility];  Service: Radiology;  Laterality: N/A;  . RENAL ANGIOGRAPHY N/A 12/03/2016   Procedure: Renal Angiography;  Surgeon: Angelia Mould, MD;  Location: [facility];  Service: Cardiovascular;  Laterality: N/A;  . TONSILLECTOMY  1992  . TUBAL LIGATION    . 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 Coronary artery disease Father    . Emphysema Father   . Heart attack Father   . Stroke Father   . Cancer Father        Unsure of type   . Allergic rhinitis Father   . Depression Mother   . Cancer Mother        skin cancer  . Bipolar disorder Brother   . Drug abuse Brother   . Cancer Brother        leukemia  . Bipolar disorder Daughter   . Allergic rhinitis Daughter   . Asthma Daughter   . Urticaria Daughter   . Diabetes Maternal Grandmother   . Stomach cancer Maternal Grandfather   . Cancer Maternal Grandfather        stomach   . Angioedema Neg Hx   . Atopy Neg Hx   . Eczema Neg Hx   . Immunodeficiency Neg Hx     Social History:  reports that she has been smoking cigarettes.  She started smoking about 46 years ago. She has a 90.00 pack-year smoking history. she has never used smokeless tobacco. She reports that she does not drink alcohol or use drugs.  Allergies:  Allergies  Allergen Reactions  . Other Anaphylaxis and Swelling    [place]  . Penicillins Anaphylaxis    Has patient had a PCN reaction causing immediate rash, facial/tongue/throat swelling, SOB or lightheadedness with hypotension: Yes Has patient had a PCN reaction causing severe rash involving mucus membranes or skin necrosis: Yes Has patient had a PCN reaction that required hospitalization Yes Has patient had a PCN reaction occurring within the last 10 years: No If all of the above answers are "NO", then may proceed with Cephalosporin use.   . Zithromax [Azithromycin] Anaphylaxis  . Aspirin Other (See Comments)    Due to stomach ulcers.   . Pineapple Rash  . Strawberry Extract Rash and Hives  . Wheat Bran Other (See Comments)    Sneezing, rhinorrhea  . Yeast-Related Products Other (See Comments)    Sneezing, rhinorrhea  . Aspartame And Phenylalanine Palpitations  . Mushroom Extract Complex Rash  . Nicardipine Nausea And Vomiting and Other (See Comments)    shaking    Medications: Prior to Admission medications   Medication Sig  Start Date End Date Taking? Authorizing Provider  albuterol (PROVENTIL HFA;VENTOLIN HFA) 108 (90 Base) MCG/ACT inhaler Inhale 2 puffs into the lungs every 6 (  six) hours as needed. 06/09/16 06/09/17 Yes [provider]  amLODipine (NORVASC) 10 MG tablet Take 1 tablet (10 mg total) by mouth daily. 06/05/14  Yes Herminio Commons, MD  aspirin-acetaminophen-caffeine (EXCEDRIN MIGRAINE) [phone] MG tablet Take 2 tablets by mouth daily as needed for migraine.    Yes [provider]  atenolol (TENORMIN) 50 MG tablet Take 1 tablet (50 mg total) by mouth daily. 05/02/17  Yes Herminio Commons, MD  celecoxib (CELEBREX) 200 MG capsule Take 200 mg by mouth at bedtime. 05/02/17  Yes [provider]  chlorthalidone (HYGROTON) 25 MG tablet Take 25 mg by mouth 2 (two) times daily.    Yes [provider]  clopidogrel (PLAVIX) 75 MG tablet Take 75 mg by mouth daily.    Yes [provider]  dexlansoprazole (DEXILANT) 60 MG capsule Take 1 tab by mouth every morning. Patient taking differently: Take 60 mg by mouth every evening.  10/18/16  Yes Esterwood, Amy S, PA-C  furosemide (LASIX) 20 MG tablet Take 1 tablet (20 mg total) by mouth daily. 05/02/17 07/31/17 Yes Herminio Commons, MD  gabapentin (NEURONTIN) 600 MG tablet Take 600 mg by mouth 4 (four) times daily.   Yes [provider]  hydrALAZINE (APRESOLINE) 50 MG tablet Take 1 tablet (50 mg total) by mouth 3 (three) times daily. 05/02/17  Yes Herminio Commons, MD  losartan (COZAAR) 50 MG tablet Take 50 mg by mouth 2 (two) times daily.  06/09/16  Yes [provider]  methocarbamol (ROBAXIN) 500 MG tablet Take 1 tablet by mouth every 6 (six) hours as needed for muscle spasms.  05/02/17  Yes [provider]  potassium chloride SA (K-DUR,KLOR-CON) 20 MEQ tablet Take 1 tablet (20 mEq total) by mouth daily. 05/02/17  Yes Herminio Commons, MD  rOPINIRole (REQUIP) 4 MG tablet Take 4 mg by  mouth at bedtime.   Yes [provider]  spironolactone (ALDACTONE) 100 MG tablet Take 100 mg by mouth daily.   Yes [provider]  zolpidem (AMBIEN CR) 12.5 MG CR tablet Take 12.5 mg by mouth at bedtime. 05/16/17  Yes [provider]    Scheduled Meds: .  stroke: mapping our early stages of recovery book   Does not apply Once  . aspirin  324 mg Oral Once  . aspirin  300 mg Rectal Daily   Or  . aspirin  325 mg Oral Daily  . clopidogrel  75 mg Oral Daily  . enoxaparin (LOVENOX) injection  40 mg Subcutaneous Q24H  . gabapentin  600 mg Oral QID  . pantoprazole  40 mg Oral Daily  . rOPINIRole  4 mg Oral QHS   Continuous Infusions: PRN Meds:.acetaminophen **OR** acetaminophen (TYLENOL) oral liquid 160 mg/5 mL **OR** acetaminophen, albuterol, labetalol, LORazepam, methocarbamol, morphine injection, senna-docusate, zolpidem     Results for orders placed or performed during the hospital encounter of 05/16/17 (from the past 48 hour(s))  CBC with Differential/Platelet     Status: Abnormal   Collection Time: 05/16/17  8:50 PM  Result Value Ref Range   WBC 12.9 (H) 4.0 - 10.5 K/uL   RBC 4.51 3.87 - 5.11 MIL/uL   Hemoglobin 13.2 12.0 - 15.0 g/dL   HCT 41.7 36.0 - 46.0 %   MCV 92.5 78.0 - 100.0 fL   MCH 29.3 26.0 - 34.0 pg   MCHC 31.7 30.0 - 36.0 g/dL   RDW 14.9 11.5 - 15.5 %   Platelets 281 150 - 400 K/uL  Neutrophils Relative % 47 %   Neutro Abs 6.1 1.7 - 7.7 K/uL   Lymphocytes Relative 45 %   Lymphs Abs 5.8 (H) 0.7 - 4.0 K/uL   Monocytes Relative 6 %   Monocytes Absolute 0.7 0.1 - 1.0 K/uL   Eosinophils Relative 2 %   Eosinophils Absolute 0.2 0.0 - 0.7 K/uL   Basophils Relative 0 %   Basophils Absolute 0.0 0.0 - 0.1 K/uL  Comprehensive metabolic panel     Status: Abnormal   Collection Time: 05/16/17  8:50 PM  Result Value Ref Range   Sodium 138 135 - 145 mmol/L   Potassium 4.0 3.5 - 5.1 mmol/L   Chloride 105 101 - 111 mmol/L   CO2 25 22 - 32  mmol/L   Glucose, Bld 150 (H) 65 - 99 mg/dL   BUN 25 (H) 6 - 20 mg/dL   Creatinine, Ser 1.04 (H) 0.44 - 1.00 mg/dL   Calcium 9.1 8.9 - 10.3 mg/dL   Total Protein 7.0 6.5 - 8.1 g/dL   Albumin 3.7 3.5 - 5.0 g/dL   AST 18 15 - 41 U/L   ALT 14 14 - 54 U/L   Alkaline Phosphatase 81 38 - 126 U/L   Total Bilirubin 0.2 (L) 0.3 - 1.2 mg/dL   GFR calc non Af Amer 59 (L) >60 mL/min   GFR calc Af Amer >60 >60 mL/min    Comment: (NOTE) The eGFR has been calculated using the CKD EPI equation. This calculation has not been validated in all clinical situations. eGFR's persistently <60 mL/min signify possible Chronic Kidney Disease.    Anion gap 8 5 - 15  Troponin I     Status: None   Collection Time: 05/16/17  8:50 PM  Result Value Ref Range   Troponin I <0.03 <0.03 ng/mL  Brain natriuretic peptide     Status: None   Collection Time: 05/16/17  8:50 PM  Result Value Ref Range   B Natriuretic Peptide 31.0 0.0 - 100.0 pg/mL  D-dimer, quantitative (not at [facility])     Status: None   Collection Time: 05/16/17  8:50 PM  Result Value Ref Range   D-Dimer, Quant 0.32 0.00 - 0.50 ug/mL-FEU    Comment: (NOTE) At the manufacturer cut-off of 0.50 ug/mL FEU, this assay has been documented to exclude PE with a sensitivity and negative predictive value of 97 to 99%.  At this time, this assay has not been approved by the FDA to exclude DVT/VTE. Results should be correlated with clinical presentation.   Lipase, blood     Status: None   Collection Time: 05/16/17  8:50 PM  Result Value Ref Range   Lipase 26 11 - 51 U/L  Ethanol     Status: None   Collection Time: 05/16/17  8:50 PM  Result Value Ref Range   Alcohol, Ethyl (B) <10 <10 mg/dL    Comment:        LOWEST DETECTABLE LIMIT FOR SERUM ALCOHOL IS 10 mg/dL FOR MEDICAL PURPOSES ONLY   Protime-INR     Status: None   Collection Time: 05/16/17  8:50 PM  Result Value Ref Range   Prothrombin Time 13.2 11.4 - 15.2 seconds   INR 1.01   APTT     Status:  None   Collection Time: 05/16/17  8:50 PM  Result Value Ref Range   aPTT 28 24 - 36 seconds  CBG monitoring, ED     Status: Abnormal   Collection Time: 05/16/17  9:02 PM  Result Value Ref Range   Glucose-Capillary 145 (H) 65 - 99 mg/dL   Comment 1 Notify RN    Comment 2 Document in Chart   I-stat troponin, ED     Status: None   Collection Time: 05/16/17  9:10 PM  Result Value Ref Range   Troponin i, poc 0.00 0.00 - 0.08 ng/mL   Comment 3            Comment: Due to the release kinetics of cTnI, a negative result within the first hours of the onset of symptoms does not rule out myocardial infarction with certainty. If myocardial infarction is still suspected, repeat the test at appropriate intervals.   I-Stat Chem 8, ED     Status: Abnormal   Collection Time: 05/16/17  9:11 PM  Result Value Ref Range   Sodium 141 135 - 145 mmol/L   Potassium 4.1 3.5 - 5.1 mmol/L   Chloride 101 101 - 111 mmol/L   BUN 23 (H) 6 - 20 mg/dL   Creatinine, Ser 1.00 0.44 - 1.00 mg/dL   Glucose, Bld 149 (H) 65 - 99 mg/dL   Calcium, Ion 1.18 1.15 - 1.40 mmol/L   TCO2 27 22 - 32 mmol/L   Hemoglobin 13.9 12.0 - 15.0 g/dL   HCT 41.0 36.0 - 46.0 %  I-Stat beta hCG blood, ED     Status: Abnormal   Collection Time: 05/16/17  9:13 PM  Result Value Ref Range   I-stat hCG, quantitative 14.0 (H) <5 mIU/mL   Comment 3            Comment:   GEST. AGE      CONC.  (mIU/mL)   <=1 WEEK        5 - 50     2 WEEKS       50 - 500     3 WEEKS       100 - 10,000     4 WEEKS     1,000 - 30,000        FEMALE AND NON-PREGNANT FEMALE:     LESS THAN 5 mIU/mL   Urinalysis, Routine w reflex microscopic     Status: Abnormal   Collection Time: 05/16/17 11:08 PM  Result Value Ref Range   Color, Urine STRAW (A) YELLOW   APPearance CLEAR CLEAR   Specific Gravity, Urine 1.024 1.005 - 1.030   pH 7.0 5.0 - 8.0   Glucose, UA NEGATIVE NEGATIVE mg/dL   Hgb urine dipstick SMALL (A) NEGATIVE   Bilirubin Urine NEGATIVE NEGATIVE    Ketones, ur NEGATIVE NEGATIVE mg/dL   Protein, ur NEGATIVE NEGATIVE mg/dL   Nitrite NEGATIVE NEGATIVE   Leukocytes, UA NEGATIVE NEGATIVE   RBC / HPF 0-5 0 - 5 RBC/hpf   WBC, UA 0-5 0 - 5 WBC/hpf   Bacteria, UA NONE SEEN NONE SEEN   Squamous Epithelial / LPF 0-5 (A) NONE SEEN  Urine rapid drug screen (hosp performed)     Status: None   Collection Time: 05/16/17 11:08 PM  Result Value Ref Range   Opiates NONE DETECTED NONE DETECTED   Cocaine NONE DETECTED NONE DETECTED   Benzodiazepines NONE DETECTED NONE DETECTED   Amphetamines NONE DETECTED NONE DETECTED   Tetrahydrocannabinol NONE DETECTED NONE DETECTED   Barbiturates NONE DETECTED NONE DETECTED    Comment:        DRUG SCREEN FOR MEDICAL PURPOSES ONLY.  IF CONFIRMATION IS NEEDED FOR ANY PURPOSE, NOTIFY LAB WITHIN 5  DAYS.        LOWEST DETECTABLE LIMITS FOR URINE DRUG SCREEN Drug Class       Cutoff (ng/mL) Amphetamine      1000 Barbiturate      200 Benzodiazepine   253 Tricyclics       664 Opiates          300 Cocaine          300 THC              50   Hemoglobin A1c     Status: Abnormal   Collection Time: 05/17/17  2:07 AM  Result Value Ref Range   Hgb A1c MFr Bld 5.9 (H) 4.8 - 5.6 %    Comment: (NOTE) Pre diabetes:          5.7%-6.4% Diabetes:              >6.4% Glycemic control for   <7.0% adults with diabetes    Mean Plasma Glucose 122.63 mg/dL    Comment: Performed at [facility] [address], [place], [place] [address]  Lipid panel     Status: Abnormal   Collection Time: 05/17/17  2:07 AM  Result Value Ref Range   Cholesterol 123 0 - 200 mg/dL   Triglycerides 98 <150 mg/dL   HDL 34 (L) >40 mg/dL   Total CHOL/HDL Ratio 3.6 RATIO   VLDL 20 0 - 40 mg/dL   LDL Cholesterol 69 0 - 99 mg/dL    Comment:        Total Cholesterol/HDL:CHD Risk Coronary Heart Disease Risk Table                     Men   Women  1/2 Average Risk   3.4   3.3  Average Risk       5.0   4.4  2 X Average Risk   9.6   7.1  3  X Average Risk  23.4   11.0        Use the calculated Patient Ratio above and the CHD Risk Table to determine the patient's CHD Risk.        ATP III CLASSIFICATION (LDL):  <100     mg/dL   Optimal  100-129  mg/dL   Near or Above                    Optimal  130-159  mg/dL   Borderline  160-189  mg/dL   High  >190     mg/dL   Very High   Troponin I (q 6hr x 3)     Status: None   Collection Time: 05/17/17  2:07 AM  Result Value Ref Range   Troponin I <0.03 <0.03 ng/mL    Studies/Results:  MRI 06-2016 FINDINGS: Study performed under anesthesia.  Brain: Stable cerebral volume since 2015 which appears normal for age. No restricted diffusion to suggest acute infarction. No midline shift, mass effect, evidence of mass lesion, ventriculomegaly, extra-axial collection or acute intracranial hemorrhage. Cervicomedullary junction and pituitary are within normal limits.  Chronic scattered mostly subcortical cerebral white matter T2 and FLAIR hyperintensity has not significantly changed since 2015 and is in a nonspecific configuration. The periventricular white matter remains spared. No cortical encephalomalacia or chronic cerebral blood products are identified. Deep gray matter nuclei appear stable and normal. There is mild scattered patchy T2 hyperintensity in the pons which has not significantly changed since 2015. The cerebellum appears  normal.  Vascular: Major intracranial vascular flow voids are stable and appear normal; dominant distal right vertebral artery.  Skull and upper cervical spine: Negative. Stable bone marrow signal which is within normal limits.  Sinuses/Orbits: The bilateral orbits soft tissues including the optic nerves appear within normal limits.  Visualized paranasal sinuses and mastoids are stable and well pneumatized.  Other: Visible internal auditory structures appear normal. Negative scalp soft tissues.  IMPRESSION: No acute intracranial  abnormality and stable MRI appearance of the brain since 2015. Chronic subcortical cerebral white matter in patchy pontine signal abnormality has not significantly changed since October 2015. The configuration is nonspecific but not typical for multiple sclerosis.     CTA HEAD NECK IMPRESSION: 1. No emergent intracranial large vessel occlusion. 2. 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 This is probably a chronic finding and would not account for the reported left-sided weakness. The intracranial posterior circulation is entirely patent. 3. Mild bilateral carotid bifurcation atherosclerotic calcification without hemodynamically significant stenosis. Aortic Atherosclerosis      The brain MRI scan from January of this year is reviewed in person. FLAIR imaging indicates that there are about 10 deep white matter plaque-like lesion couple that are perpendicular to the ventricle. No lesion of the corpus callosum is observed. There are 2 deep lesions involving the tegmentum of the pontine region on the left side consistent with ischemic changes. No acute lesions are noted. There is no hemorrhage seen.   Gricel Copen A. Merlene Laughter, M.D.  Diplomate, Tax adviser of Psychiatry and Neurology ( Neurology). 05/17/2017, 5:46 PM

## 2017-05-17 NOTE — Evaluation (Signed)
Physical Therapy Evaluation Patient Details Name: Katrina [name] MRN: [id number] DOB: 1960-12-26 Today's Date: 05/17/2017   History of Present Illness  Katrina [name] is a 56 y.o. female with medical history significant for history of CVA with residual deficits, accelerated hypertension, and COPD, now presenting to the emergency department with numerous complaints, including intermittent chest pain, headache, and left-sided weakness.  Patient reports recurrent, intermittent pain in the left chest since yesterday, lasting approximately 1 minute at a time without radiation, and without alleviating or exacerbating factors identified.  She also reports some left-sided weakness, but has difficulty determining the time of onset, noting that some of it may be chronic and it seems to wax and wane.  Patient was suffering from a headache that developed gradually several hours ago, eased-off spontaneously, now returns. The headache is moderate in intensity, dull in character, localized to behind the left eye, similar to her chronic headaches, and without alleviating or exacerbating factors.  No fevers or chills and no significant dyspnea or cough.    Clinical Impression  Patient presents with left ankle drop foot, drags left toes when walking increasing risk for falls.  Patient will benefit from use of AFO for left ankle.  Patient states an AFO was recommended after her first stroke, but she did not follow through due to the distance of  the brace clinic from her place of residence.  Patient will benefit from continued physical therapy in hospital and recommended venue below to increase strength, balance, endurance for safe ADLs and gait.    Follow Up Recommendations Home health PT;Supervision - Intermittent    Equipment Recommendations  Other (comment)(AFO for left ankle drop foot)    Recommendations for Other Services       Precautions / Restrictions Precautions Precautions: Fall Restrictions Weight  Bearing Restrictions: No      Mobility  Bed Mobility               General bed mobility comments: Patient presents up in chair and states she does not sleep in a bed, she sleeps in chair at home, declined to attempt bed mobility  Transfers Overall transfer level: Needs assistance Equipment used: None;Straight cane Transfers: Sit to/from Stand;Stand Pivot Transfers Sit to Stand: Supervision Stand pivot transfers: Supervision          Ambulation/Gait Ambulation/Gait assistance: Min assist Ambulation Distance (Feet): 15 Feet Assistive device: Straight cane Gait Pattern/deviations: Decreased step length - left;Decreased stance time - left;Decreased dorsiflexion - left;Decreased weight shift to left   Gait velocity interpretation: Below normal speed for age/gender General Gait Details: demonstrates slow labored cadence with dragging of left toes, has to hike left hip to advance LLE, limited secondary to c/o fatigue  Stairs            Wheelchair Mobility    Modified Rankin (Stroke Patients Only)       Balance Overall balance assessment: Needs assistance Sitting-balance support: No upper extremity supported;Feet supported Sitting balance-Leahy Scale: Good     Standing balance support: Single extremity supported;During functional activity Standing balance-Leahy Scale: Fair Standing balance comment: using SPC                             Pertinent Vitals/Pain Pain Assessment: 0-10 Pain Score: 10-Worst pain ever Pain Location: headache  Pain Descriptors / Indicators: Aching Pain Intervention(s): Limited activity within patient's tolerance;Monitored during session    Home Living Family/patient expects to be discharged to:: Private  residence Living Arrangements: Alone Available Help at Discharge: Family Type of Home: House Home Access: Level entry     Home Layout: Laundry or work area in [place]: Kasandra Knudsen - single point;Walker - 2  wheels Additional Comments: Patient states she lives in the basement and go upstairs to the main unit to use the bathroom    Prior Function Level of Independence: Independent with assistive device(s)               Hand Dominance   Dominant Hand: Right    Extremity/Trunk Assessment   Upper Extremity Assessment Upper Extremity Assessment: Defer to OT evaluation    Lower Extremity Assessment Lower Extremity Assessment: Generalized weakness;LLE deficits/detail LLE Deficits / Details: grossly 3+/5, except ankle dorsiflexion 0/5    Cervical / Trunk Assessment Cervical / Trunk Assessment: Normal  Communication   Communication: No difficulties  Cognition Arousal/Alertness: Awake/alert Behavior During Therapy: WFL for tasks assessed/performed Overall Cognitive Status: Within Functional Limits for tasks assessed                                        General Comments      Exercises     Assessment/Plan    PT Assessment Patient needs continued PT services  PT Problem List Decreased strength;Decreased activity tolerance;Decreased balance;Decreased mobility;Decreased range of motion       PT Treatment Interventions Gait training;Stair training;Functional mobility training;Therapeutic activities;Therapeutic exercise;Patient/family education    PT Goals (Current goals can be found in the Care Plan section)  Acute Rehab PT Goals Patient Stated Goal: return home PT Goal Formulation: With patient Time For Goal Achievement: 05/21/17 Potential to Achieve Goals: Good    Frequency 7X/week   Barriers to discharge        Co-evaluation               AM-PAC PT "6 Clicks" Daily Activity  Outcome Measure Difficulty turning over in bed (including adjusting bedclothes, sheets and blankets)?: Unable Difficulty moving from lying on back to sitting on the side of the bed? : Unable Difficulty sitting down on and standing up from a chair with arms (e.g.,  wheelchair, bedside commode, etc,.)?: A Little Help needed moving to and from a bed to chair (including a wheelchair)?: A Little Help needed walking in hospital room?: A Little Help needed climbing 3-5 steps with a railing? : A Lot 6 Click Score: 13    End of Session   Activity Tolerance: Patient limited by fatigue Patient left: in chair;with call bell/phone within reach Nurse Communication: Mobility status PT Visit Diagnosis: Unsteadiness on feet (R26.81);Other abnormalities of gait and mobility (R26.89);Muscle weakness (generalized) (M62.81)    Time: 4315-4008 PT Time Calculation (min) (ACUTE ONLY): 20 min   Charges:   PT Evaluation $PT Eval Moderate Complexity: 1 Mod PT Treatments $Therapeutic Activity: 8-22 mins   PT G Codes:   PT G-Codes **NOT FOR INPATIENT CLASS** Functional Assessment Tool Used: AM-PAC 6 Clicks Basic Mobility Functional Limitation: Mobility: Walking and moving around Mobility: Walking and Moving Around Current Status (Q7619): At least 40 percent but less than 60 percent impaired, limited or restricted Mobility: Walking and Moving Around Goal Status [phone]): At least 40 percent but less than 60 percent impaired, limited or restricted Mobility: Walking and Moving Around Discharge Status [phone]): At least 40 percent but less than 60 percent impaired, limited or restricted    2:15 PM,  05/17/17 Lonell Grandchild, MPT Physical Therapist with [facility] [phone] office [phone] mobile phone

## 2017-05-17 NOTE — Plan of Care (Signed)
  Acute Rehab PT Goals(only PT should resolve) Patient Will Transfer Sit To/From Stand 05/17/2017 1417 - Progressing by Lonell Grandchild, PT Flowsheets Taken 05/17/2017 1417  Patient will transfer sit to/from stand with modified independence Pt Will Transfer Bed To Chair/Chair To Bed 05/17/2017 1417 - Progressing by Lonell Grandchild, PT Flowsheets Taken 05/17/2017 1417  Pt will Transfer Bed to Chair/Chair to Bed with modified independence Pt Will Ambulate 05/17/2017 1417 - Progressing by Lonell Grandchild, PT Flowsheets Taken 05/17/2017 1417  Pt will Ambulate 50 feet;with cane;with supervision  2:18 PM, 05/17/17 Lonell Grandchild, MPT Physical Therapist with [facility] [phone] office [phone] mobile phone

## 2017-05-17 NOTE — Care Management Obs Status (Signed)
[facility] NOTIFICATION   Patient Details  Name: Katrina [name] MRN: [id number] Date of Birth: 1961/04/27   Medicare Observation Status Notification Given:  Yes    [place], Chauncey Reading, RN 05/17/2017, 2:25 PM

## 2017-05-17 NOTE — Progress Notes (Signed)
PROGRESS NOTE  Katrina [name] CHY:[id number] DOB: 04-Jan-1961 DOA: 05/16/2017 PCP: Berkley Harvey, NP  Brief History:  56 year old female with a history of gnawing hypertension, COPD, depression, and stroke with left hemiparesis 2016 presented with 1 day history of left-sided chest discomfort underneath her left breast radiating to the shoulder, worsening left-sided weakness on the evening of May 16, 2017, and headache.  The patient states that she had left-sided chest discomfort that has been on and off since afternoon of May 16, 2017 with some shortness of breath.  The patient states that she chronically has shortness of breath, but denied any nausea, vomiting, presyncope.  She denies any fevers, chills, vomiting, diarrhea, abdominal pain, dysuria, hematuria.  While walking to the living room around dinnertime, the patient noted worsening of her left upper and left lower extremity weakness.  She states there is worse than her usual baseline weakness.  She also had associated headache behind her left eye without any visual disturbance, dysarthria, dysesthesia.  She continues Symbicort and 1/2 packs/day.  She endorses compliance with all her medications.  Because of complaints, she presented for further evaluation.  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; mild bilateral carotid  atherosclerosis without hemodynamically significant stenosis -MRI brain-- -MRA brain-- -Echo-- -LDL--69 -HbA1C-- -Antiplatelet--aspirin,  Plavix (was not taking plavix at home)  Atypical chest pain -Finish cycling troponins -Echocardiogram -LDL 69 -Coronary angiography on 01/31/15 showed no angiographic evidence of coronary disease with normal left ventricle systolic function  -Personally reviewed EKG sinus rhythm, nonspecific T wave change -Personally reviewed chest x-ray--no infiltrates or edema  Malignant hypertension -Patient presented with blood pressure 222/101 -allowing permissive hypertension in first 24 hours -July 2018 renal angiogram--negative for renal artery stenosis  COPD/tobacco abuse -Stable on room air -Tobacco cessation discussed  Diabetes mellitus type 2 -December 04, 2016 hemoglobin A1c 5.8 -Repeat hemoglobins A1C -Not on any agents at home  Chronic daily headache -Follow-up outpatient neurology  GERD -continue PPI  Disposition Plan:   Home 12/12 if stable Family Communication:   Family at bedside--Total time spent 40 minutes.  Greater than 50% spent face to face counseling and coordinating care.   Consultants:  neurology  Code Status:  FULL   DVT Prophylaxis:  [place] Lovenox   Procedures: As Listed in Progress Note Above  Antibiotics: None    Subjective: Patient states that her left-sided weakness is about the same.  She denies any fevers, chills, dizziness, chest pain, shortness breath, nausea, vomiting, diarrhea, abdominal pain, dysuria, hematuria.  She has some left-sided chest discomfort.  Objective: Vitals:   05/17/17 0250 05/17/17 0300 05/17/17 0400 05/17/17 0500  BP: (!) 201/87     Pulse: 75 68    Resp: 17 20    Temp:   98.1 F (36.7 C)   TempSrc:   Oral   SpO2: 96% 95%    Weight:    98.2 kg (216 lb 7.9 oz)  Height:       No intake or output data in the 24 hours ending 05/17/17 0720 Weight change:  Exam:   General:  Pt is alert, follows commands appropriately, not in acute distress  HEENT: No icterus, No thrush, No neck mass, [place]/AT  Cardiovascular: RRR, S1/S2,  no rubs, no gallops  Respiratory: CTA bilaterally, no wheezing, no crackles, no rhonchi  Abdomen: Soft/+BS, non tender, non distended, no guarding  Extremities: No edema, No lymphangitis, No petechiae, No rashes, no synovitis   Data Reviewed: I have personally reviewed following labs and imaging studies Basic Metabolic Panel: Recent Labs  Lab 05/16/17 2050 05/16/17 2111  NA 138 141  K 4.0 4.1  CL 105 101  CO2 25  --   GLUCOSE 150* 149*  BUN 25* 23*  CREATININE 1.04* 1.00  CALCIUM 9.1  --    Liver Function Tests: Recent Labs  Lab 05/16/17 2050  AST 18  ALT 14  ALKPHOS 81  BILITOT 0.2*  PROT 7.0  ALBUMIN 3.7   Recent Labs  Lab 05/16/17 2050  LIPASE 26   No results for input(s): AMMONIA in the last 168 hours. Coagulation Profile: Recent Labs  Lab 05/16/17 2050  INR 1.01   CBC: Recent Labs  Lab 05/16/17 2050 05/16/17 2111  WBC 12.9*  --   NEUTROABS 6.1  --   HGB 13.2 13.9  HCT 41.7 41.0  MCV 92.5  --   PLT 281  --    Cardiac Enzymes: Recent Labs  Lab 05/16/17 2050 05/17/17 0207  TROPONINI <0.03 <0.03   BNP: Invalid input(s): POCBNP CBG: Recent Labs  Lab 05/16/17 2102  GLUCAP 145*   HbA1C: No results for input(s): HGBA1C in the last 72 hours. Urine analysis:    Component Value Date/Time   COLORURINE STRAW (A) 05/16/2017 2308   APPEARANCEUR CLEAR 05/16/2017 2308   LABSPEC 1.024 05/16/2017 2308   PHURINE 7.0 05/16/2017 2308   GLUCOSEU NEGATIVE 05/16/2017 2308   HGBUR SMALL (A) 05/16/2017 2308   BILIRUBINUR NEGATIVE 05/16/2017 2308   KETONESUR NEGATIVE 05/16/2017 2308   PROTEINUR NEGATIVE 05/16/2017 2308   UROBILINOGEN 0.2 01/23/2015 2255   NITRITE NEGATIVE 05/16/2017 2308   LEUKOCYTESUR NEGATIVE 05/16/2017 2308   Sepsis Labs: @LABRCNTIP (procalcitonin:4,lacticidven:4) )No results found for this or any previous visit (from the past 240 hour(s)).   Scheduled Meds: .  stroke: mapping our early stages of recovery book   Does not  apply Once  . aspirin  324 mg Oral Once  . aspirin  300 mg Rectal Daily   Or  . aspirin  325 mg Oral Daily  . clopidogrel  75 mg Oral Daily  . enoxaparin (LOVENOX) injection  40 mg Subcutaneous Q24H  . gabapentin  600 mg Oral QID  . pantoprazole  40 mg Oral Daily  . rOPINIRole  4 mg Oral QHS   Continuous Infusions:  Procedures/Studies: Ct Angio Head W Or Wo Contrast  Result Date: 05/16/2017 CLINICAL DATA:  Sudden onset left-sided weakness EXAM: CT ANGIOGRAPHY HEAD AND NECK TECHNIQUE: Multidetector CT imaging of the head and neck was performed using the standard protocol during bolus administration of intravenous contrast. Multiplanar CT image reconstructions and MIPs were obtained to evaluate the vascular anatomy. Carotid stenosis measurements (when applicable) are obtained utilizing NASCET criteria, using the distal internal carotid diameter as the denominator. CONTRAST:  139mL ISOVUE-370 IOPAMIDOL (ISOVUE-370) INJECTION 76% COMPARISON:  Head CT 05/16/2017 Head CT 04/25/2017 FINDINGS: CTA NECK FINDINGS Aortic arch: There is minimal calcific atherosclerosis of the aortic arch. There is no aneurysm, dissection or hemodynamically significant stenosis of the visualized ascending aorta and aortic arch. Conventional 3 vessel aortic branching pattern. The  visualized proximal subclavian arteries are normal. Right carotid system: The right common carotid origin is widely patent. There is no common carotid or internal carotid artery dissection or aneurysm. Atherosclerotic calcification at the carotid bifurcation without hemodynamically significant stenosis. Left carotid system: The left common carotid origin is widely patent. There is no common carotid or internal carotid artery dissection or aneurysm. Atherosclerotic calcification at the carotid bifurcation without hemodynamically significant stenosis. Vertebral arteries: The vertebral system is right dominant. The left vertebral artery is diminutive along  its entire course and its origin is not visible. Minimal opacification begins at the C4 level. The remainder of the left vertebral artery is severely stenotic, worsened from the CTA obtained 10/01/2015. The right vertebral artery is normal to its confluence with the basilar artery. Skeleton: There is no bony spinal canal stenosis. No lytic or blastic lesions. Other neck: The nasopharynx is clear. The oropharynx and hypopharynx are normal. The epiglottis is normal. The supraglottic larynx, glottis and subglottic larynx are normal. No retropharyngeal collection. The parapharyngeal spaces are preserved. The parotid and submandibular glands are normal. No sialolithiasis or salivary ductal dilatation. The thyroid gland is normal. There is no cervical lymphadenopathy. Upper chest: No pneumothorax or pleural effusion. No nodules or masses. Review of the MIP images confirms the above findings CTA HEAD FINDINGS Anterior circulation: --Intracranial internal carotid arteries: Normal. --Anterior cerebral arteries: Normal. --Middle cerebral arteries: Patent. Mild atherosclerotic narrowing of the right middle cerebral artery without hemodynamically significant stenosis. Normal left MCA. --Posterior communicating arteries: Present on the right, absent on the left. Posterior circulation: --Posterior cerebral arteries: Normal. --Superior cerebellar arteries: Normal. --Basilar artery: Normal. --Anterior inferior cerebellar arteries: Normal. --Posterior inferior cerebellar arteries: Normal. Venous sinuses: As permitted by contrast timing, patent. Anatomic variants: None Delayed phase: No parenchymal contrast enhancement. Review of the MIP images confirms the above findings IMPRESSION: 1. No emergent intracranial large vessel occlusion. 2. 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 This is probably a chronic finding and would not account for the reported  left-sided weakness. The intracranial posterior circulation is entirely patent. 3. Mild bilateral carotid bifurcation atherosclerotic calcification without hemodynamically significant stenosis. Aortic Atherosclerosis (ICD10-I70.0). These results were called by telephone at the time of interpretation on 05/16/2017 at 10:13 pm to Dr. Criss Rosales, who verbally acknowledged these results. Electronically Signed   By: Ulyses Jarred M.D.   On: 05/16/2017 22:13   Dg Chest 1 View  Result Date: 05/16/2017 CLINICAL DATA:  Chest pain EXAM: CHEST 1 VIEW COMPARISON:  04/17/2017 FINDINGS: The heart size and mediastinal contours are within normal limits. Both lungs are clear. The visualized skeletal structures are unremarkable. IMPRESSION: No active disease. Electronically Signed   By: Inez Catalina M.D.   On: 05/16/2017 21:53   Dg Chest 2 View  Result Date: 04/17/2017 CLINICAL DATA:  Nonproductive cough 2 weeks with congestion and wheezing. EXAM: CHEST  2 VIEW COMPARISON:  03/19/2017 FINDINGS: Lungs are adequately inflated without focal consolidation or effusion. Cardiomediastinal silhouette is within normal. There are mild degenerative changes of the spine. IMPRESSION: No active cardiopulmonary disease. Electronically Signed   By: Marin Olp M.D.   On: 04/17/2017 21:21   Dg Lumbar Spine Complete  Result Date: 04/25/2017 CLINICAL DATA:  Initial evaluation for acute trauma, fall. EXAM: LUMBAR SPINE - COMPLETE 4+ VIEW COMPARISON:  Prior radiograph from 05/06/2016. FINDINGS: Five non rib-bearing lumbar type vertebral bodies. Vertebral bodies normally aligned with preservation of the normal lumbar lordosis. No listhesis.  Patient is status post PLIF at L5-S1. Hardware intact without complication. No acute fracture. Visualized sacrum intact. Aortic atherosclerosis.  Cholecystectomy clips noted. IMPRESSION: 1. No radiographic evidence for acute abnormality within the lumbar spine. 2. Sequelae of prior PLIF at L5-S1 without  hardware complication. 3. Aortic atherosclerosis. Electronically Signed   By: Jeannine Boga M.D.   On: 04/25/2017 01:43   Dg Shoulder Right  Result Date: 04/25/2017 CLINICAL DATA:  Initial evaluation for acute trauma, fall. EXAM: RIGHT SHOULDER - 2+ VIEW COMPARISON:  Prior MRI from 11/17/2016. FINDINGS: There is no evidence of fracture or dislocation. There is no evidence of arthropathy or other focal bone abnormality. Soft tissues are unremarkable. IMPRESSION: No acute osseous abnormality about the right shoulder. Electronically Signed   By: Jeannine Boga M.D.   On: 04/25/2017 01:33   Ct Head Wo Contrast  Result Date: 04/25/2017 CLINICAL DATA:  56 y/o F; fall down stairs with neck pain, weakness, dizziness, headache. EXAM: CT HEAD WITHOUT CONTRAST CT CERVICAL SPINE WITHOUT CONTRAST TECHNIQUE: Multidetector CT imaging of the head and cervical spine was performed following the standard protocol without intravenous contrast. Multiplanar CT image reconstructions of the cervical spine were also generated. COMPARISON:  08/25/2016 and 07/18/2014 CT of head and cervical spine. FINDINGS: CT HEAD FINDINGS Brain: No evidence of acute infarction, hemorrhage, hydrocephalus, extra-axial collection or mass lesion/mass effect. Stable mild chronic microvascular ischemic changes of the brain. Vascular: Mild calcific atherosclerosis of carotid siphons. Skull: Normal. Negative for fracture or focal lesion. Sinuses/Orbits: No acute finding. Other: None. CT CERVICAL SPINE FINDINGS Alignment: Straightening of cervical lordosis. Skull base and vertebrae: No acute fracture. No primary bone lesion or focal pathologic process. Soft tissues and spinal canal: No prevertebral fluid or swelling. No visible canal hematoma. Disc levels: Uncovertebral and facet hypertrophy with mild discogenic degenerative changes at the C5-C7 levels. Uncovertebral hypertrophy results in mild left foraminal encroachment at C6-7 and right  foraminal encroachment at C5-C7. No significant bony canal stenosis. Upper chest: Negative. Other: Calcific atherosclerosis of the carotid bifurcations bilaterally. Brief retropharyngeal course of right proximal ICA. IMPRESSION: 1. No acute intracranial abnormality or calvarial fracture. 2. No acute fracture or dislocation of cervical spine. 3. Stable mild chronic microvascular ischemic changes of the brain. 4. Stable mild cervical spine spondylosis greatest at C5-C7 levels. Electronically Signed   By: Kristine Garbe M.D.   On: 04/25/2017 01:49   Ct Angio Neck W And/or Wo Contrast  Result Date: 05/16/2017 CLINICAL DATA:  Sudden onset left-sided weakness EXAM: CT ANGIOGRAPHY HEAD AND NECK TECHNIQUE: Multidetector CT imaging of the head and neck was performed using the standard protocol during bolus administration of intravenous contrast. Multiplanar CT image reconstructions and MIPs were obtained to evaluate the vascular anatomy. Carotid stenosis measurements (when applicable) are obtained utilizing NASCET criteria, using the distal internal carotid diameter as the denominator. CONTRAST:  171mL ISOVUE-370 IOPAMIDOL (ISOVUE-370) INJECTION 76% COMPARISON:  Head CT 05/16/2017 Head CT 04/25/2017 FINDINGS: CTA NECK FINDINGS Aortic arch: There is minimal calcific atherosclerosis of the aortic arch. There is no aneurysm, dissection or hemodynamically significant stenosis of the visualized ascending aorta and aortic arch. Conventional 3 vessel aortic branching pattern. The visualized proximal subclavian arteries are normal. Right carotid system: The right common carotid origin is widely patent. There is no common carotid or internal carotid artery dissection or aneurysm. Atherosclerotic calcification at the carotid bifurcation without hemodynamically significant stenosis. Left carotid system: The left common carotid origin is widely patent. There is no common carotid or internal  carotid artery dissection or  aneurysm. Atherosclerotic calcification at the carotid bifurcation without hemodynamically significant stenosis. Vertebral arteries: The vertebral system is right dominant. The left vertebral artery is diminutive along its entire course and its origin is not visible. Minimal opacification begins at the C4 level. The remainder of the left vertebral artery is severely stenotic, worsened from the CTA obtained 10/01/2015. The right vertebral artery is normal to its confluence with the basilar artery. Skeleton: There is no bony spinal canal stenosis. No lytic or blastic lesions. Other neck: The nasopharynx is clear. The oropharynx and hypopharynx are normal. The epiglottis is normal. The supraglottic larynx, glottis and subglottic larynx are normal. No retropharyngeal collection. The parapharyngeal spaces are preserved. The parotid and submandibular glands are normal. No sialolithiasis or salivary ductal dilatation. The thyroid gland is normal. There is no cervical lymphadenopathy. Upper chest: No pneumothorax or pleural effusion. No nodules or masses. Review of the MIP images confirms the above findings CTA HEAD FINDINGS Anterior circulation: --Intracranial internal carotid arteries: Normal. --Anterior cerebral arteries: Normal. --Middle cerebral arteries: Patent. Mild atherosclerotic narrowing of the right middle cerebral artery without hemodynamically significant stenosis. Normal left MCA. --Posterior communicating arteries: Present on the right, absent on the left. Posterior circulation: --Posterior cerebral arteries: Normal. --Superior cerebellar arteries: Normal. --Basilar artery: Normal. --Anterior inferior cerebellar arteries: Normal. --Posterior inferior cerebellar arteries: Normal. Venous sinuses: As permitted by contrast timing, patent. Anatomic variants: None Delayed phase: No parenchymal contrast enhancement. Review of the MIP images confirms the above findings IMPRESSION: 1. No emergent intracranial large  vessel occlusion. 2. 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 This is probably a chronic finding and would not account for the reported left-sided weakness. The intracranial posterior circulation is entirely patent. 3. Mild bilateral carotid bifurcation atherosclerotic calcification without hemodynamically significant stenosis. Aortic Atherosclerosis (ICD10-I70.0). These results were called by telephone at the time of interpretation on 05/16/2017 at 10:13 pm to Dr. Criss Rosales, who verbally acknowledged these results. Electronically Signed   By: Ulyses Jarred M.D.   On: 05/16/2017 22:13   Ct Cervical Spine Wo Contrast  Result Date: 04/25/2017 CLINICAL DATA:  56 y/o F; fall down stairs with neck pain, weakness, dizziness, headache. EXAM: CT HEAD WITHOUT CONTRAST CT CERVICAL SPINE WITHOUT CONTRAST TECHNIQUE: Multidetector CT imaging of the head and cervical spine was performed following the standard protocol without intravenous contrast. Multiplanar CT image reconstructions of the cervical spine were also generated. COMPARISON:  08/25/2016 and 07/18/2014 CT of head and cervical spine. FINDINGS: CT HEAD FINDINGS Brain: No evidence of acute infarction, hemorrhage, hydrocephalus, extra-axial collection or mass lesion/mass effect. Stable mild chronic microvascular ischemic changes of the brain. Vascular: Mild calcific atherosclerosis of carotid siphons. Skull: Normal. Negative for fracture or focal lesion. Sinuses/Orbits: No acute finding. Other: None. CT CERVICAL SPINE FINDINGS Alignment: Straightening of cervical lordosis. Skull base and vertebrae: No acute fracture. No primary bone lesion or focal pathologic process. Soft tissues and spinal canal: No prevertebral fluid or swelling. No visible canal hematoma. Disc levels: Uncovertebral and facet hypertrophy with mild discogenic degenerative changes at the C5-C7 levels. Uncovertebral hypertrophy  results in mild left foraminal encroachment at C6-7 and right foraminal encroachment at C5-C7. No significant bony canal stenosis. Upper chest: Negative. Other: Calcific atherosclerosis of the carotid bifurcations bilaterally. Brief retropharyngeal course of right proximal ICA. IMPRESSION: 1. No acute intracranial abnormality or calvarial fracture. 2. No acute fracture or dislocation of cervical spine. 3. Stable mild chronic microvascular ischemic  changes of the brain. 4. Stable mild cervical spine spondylosis greatest at C5-C7 levels. Electronically Signed   By: Kristine Garbe M.D.   On: 04/25/2017 01:49   Dg Knee Complete 4 Views Right  Result Date: 04/25/2017 CLINICAL DATA:  Initial evaluation for acute trauma, fall. EXAM: RIGHT KNEE - COMPLETE 4+ VIEW COMPARISON:  None. FINDINGS: No evidence of fracture, dislocation, or joint effusion. No evidence of arthropathy or other focal bone abnormality. Soft tissues are unremarkable. IMPRESSION: No acute osseous abnormality about the right knee. Electronically Signed   By: Jeannine Boga M.D.   On: 04/25/2017 01:40   Dg Hip Unilat W Or Wo Pelvis 2-3 Views Right  Result Date: 04/25/2017 CLINICAL DATA:  Initial evaluation for acute trauma, fall. EXAM: DG HIP (WITH OR WITHOUT PELVIS) 2-3V RIGHT COMPARISON:  Prior radiograph from 05/02/2016. FINDINGS: No acute fracture dislocation. Femoral heads in normal line with the acetabula. Femoral head heights preserved. Bony pelvis intact. SI joints approximated. No acute soft tissue abnormality. Fusion hardware noted within the lower lumbar spine. IMPRESSION: No acute osseous abnormality about the right hip.  The Electronically Signed   By: Jeannine Boga M.D.   On: 04/25/2017 01:35   Ct Head Code Stroke Wo Contrast`  Result Date: 05/16/2017 CLINICAL DATA:  Code stroke.  Sudden onset left-sided weakness EXAM: CT HEAD WITHOUT CONTRAST TECHNIQUE: Contiguous axial images were obtained from the base  of the skull through the vertex without intravenous contrast. COMPARISON:  Head CT 04/25/2017 FINDINGS: Brain: No mass lesion or acute hemorrhage. No focal hypoattenuation of the basal ganglia or cortex to indicate infarcted tissue. No hydrocephalus or age advanced atrophy. Vascular: No hyperdense vessel. No advanced atherosclerotic calcification of the arteries at the skull base. Skull: Normal visualized skull base, calvarium and extracranial soft tissues. Sinuses/Orbits: No sinus fluid levels or advanced mucosal thickening. No mastoid effusion. Normal orbits. ASPECTS [facility] Stroke Program Early CT Score) - Ganglionic level infarction (caudate, lentiform nuclei, internal capsule, insula, M1-M3 cortex): 7 - Supraganglionic infarction (M4-M6 cortex): 3 Total score (0-10 with 10 being normal): 10 IMPRESSION: 1. Normal head CT. 2. ASPECTS is 10. These results were called by telephone at the time of interpretation on 05/16/2017 at 9:16 pm to Dr. Ezequiel Essex , who verbally acknowledged these results. Electronically Signed   By: Ulyses Jarred M.D.   On: 05/16/2017 21:16    Orson Eva, DO  [facility] Pager [phone]  If 7PM-7AM, please contact night-coverage [email or web address] Password [email or web address] 05/17/2017, 7:20 AM   LOS: 0 days

## 2017-05-17 NOTE — Progress Notes (Signed)
Patient stated she does not want eat the meal provided and she does not take certain medications.  She said she will be leaving today and she does not have to sign AMA form.  Stated she leaves when she wants to leave.  She also stated she can smoke when she wants to smoke. Offered nicotine patch and refused at this time.

## 2017-05-17 NOTE — Consult Note (Signed)
   [facility] CM Inpatient Consult   05/17/2017  JENISHA FAISON Mar 09, 1961 [id number]   Referral received from in patient casemanager for [facility] Management services and post hospital discharge follow up related to a diagnosis of DM , TIA, and 2 admits and 6 Ed visits in 6 months. Patient was evaluated for telephonic/community based chronic disease management services with [facility] care Management Program as a benefit of patient's [employer]. Spoke to patient on the phone in her hospital room to explain [facility] services. Patient endorses her primary care provider to be [name], MD. Patient states she prefers telephonic outreach related to she lives with her daughter. Verbal consent recived. Patient gave [phone] as the best number to reach her. .Patient will receive post hospital discharge calls and be evaluated for any further appropriate services from [facility] management. [facility] Care Management services do not interfere with or replace any services arranged by the inpatient care management team.  Made inpatient RNCM aware that [facility] will be following for care management. For additional questions please contact:   Damire Remedios RN, [facility] Liaison  [phone]) Business Mobile [phone]) Toll free office

## 2017-05-17 NOTE — Progress Notes (Signed)
*  PRELIMINARY RESULTS* Echocardiogram 2D Echocardiogram has been performed.  Katrina [name] 05/17/2017, 10:32 AM

## 2017-05-17 NOTE — Care Management (Signed)
Spoke with Merlene Morse of [facility] to make aware of referral to [facility] care management. She plans to contact patient.

## 2017-05-17 NOTE — Care Management Note (Signed)
Case Management Note  Patient Details  Name: Katrina [name] MRN: [id number] Date of Birth: 1961-01-17  Subjective/Objective:   Adm with ?CVA. From home with daughter and son in law. Reports she stays downstairs. Has cane and RW pta but reports she doesn't get to use them much as there as there isn't much room to navigate. PT recommends HH PT, patient declines, stating her family doesn't want her to have visitors. We discuss OP PT, she also declines, but keeps repeating that she doesn't have anyone or any resources. States people tell her she needs to find her own place to live.  Offered HH and OP again and she still refuses. Offered [employer], she declines. We discuss medicaid placement and she declines.   Reports she doesn't really "like" PT. She has PCP, still drives, and reports no issues affording medications.  She is eligible for [facility] services and we discuss how this can be telephone calls if she prefers and that she can opt out at any time. She is agreeable.             Action/Plan: DD home with self care with [facility] referral.   Expected Discharge Date:       05/18/2017           Expected Discharge Plan:  Home/Self Care  In-House Referral:     Discharge planning Services  CM Consult  Post Acute Care Choice:    Choice offered to:  Patient  DME Arranged:    DME Agency:     HH Arranged:  Patient Refused [facility] Agency:     Status of Service:  Completed, signed off  If discussed at [employer] of Stay Meetings, dates discussed:    Additional Comments:  Alejandra Barna, Chauncey [place], RN 05/17/2017, 2:36 PM

## 2017-05-17 NOTE — Evaluation (Signed)
Speech Language Pathology Evaluation Patient Details Name: CONNY SITU MRN: [id number] DOB: [birth year] Today's Date: 05/17/2017 Time: 4128-7867 SLP Time Calculation (min) (ACUTE ONLY): 22 min  Problem List:  Patient Active Problem List   Diagnosis Date Noted  . Hypertension 12/03/2016  . Adverse food reaction 10/14/2016  . Pulmonary nodules 10/14/2016  . Non-seasonal allergic rhinitis due to fungal spores 10/14/2016  . Cigarette smoker 09/24/2016  . Cerebral microvascular disease 08/19/2016  . Non compliance with medical treatment 05/11/2016  . Leg pain, anterior, right 05/11/2016  . Syncope 05/02/2016  . Type 2 diabetes mellitus with diabetic neuropathy ([place]) 05/02/2016  . Restless leg syndrome 05/02/2016  . History of CVA with residual deficit   . Vascular headache   . History of chest pain   . Generalized anxiety disorder   . Chronic bilateral low back pain without sciatica   . Peripheral neuropathy   . Migraine without status migrainosus, not intractable   . PTSD (post-traumatic stress disorder)   . History of cerebrovascular accident (CVA) with residual deficit   . CVA (cerebral vascular accident) ([place]) 04/03/2016  . Stroke (cerebrum) ([place]) 04/03/2016  . Post traumatic stress disorder 10/06/2015  . Precordial pain   . Migraine syndrome 01/24/2015  . Migraine 01/24/2015  . TIA (transient ischemic attack) 12/25/2014  . Hypertensive urgency   . Atypical chest pain   . Malignant hypertension   . Hypokalemia 04/16/2014  . Muscle weakness (generalized) 04/05/2014  . Stiffness of left hip joint 04/05/2014  . Pain in left hip 04/05/2014  . Diastolic dysfunction 67/20/9470  . Normal coronary arteries 03/20/2014  . PUD (peptic ulcer disease) 03/20/2014  . Type 2 diabetes, uncontrolled, with neuropathy ([place]) 03/20/2014  . Morbid obesity due to excess calories ([place]) 03/20/2014  . Left-sided weakness 03/20/2014  . Paresthesias 03/20/2014  . Cerebrovascular disease  03/20/2014  . Dyspnea on exertion 03/05/2012  . Tobacco abuse 03/05/2012  . Moderate COPD (chronic obstructive pulmonary disease) ([place]) 11/23/2011  . GERD (gastroesophageal reflux disease) 11/23/2011  . Uncontrolled hypertension 11/23/2011   Past Medical History:  Past Medical History:  Diagnosis Date  . Allergy   . Anemia 1975-1976   . Anginal pain ([place])   . Anxiety    takes Valium daily as needed  . Arthritis    "spine" (12/03/2016)  . Asthma    has inhalers but doesn't use (12/03/2016)  . CAD (coronary artery disease)    Moderate nonobstructive 2012-2013, [place]; No angiographic evidence of CAD 01/31/15 [facility]  . Chronic bronchitis ([place])    "get it a couple times q yr" (12/03/2016)  . Chronic kidney disease   . Chronic lower back pain    budlging disc   . Claustrophobia   . COPD (chronic obstructive pulmonary disease) ([place])   . Daily headache   . Depression   . Diverticulitis   . Family history of adverse reaction to anesthesia    2 daughters gets extremely sick   . GERD (gastroesophageal reflux disease)    takes Dexilant daily  . Heart murmur   . History of blood transfusion 1975-1976 "several"   no abnormal reaction noted  . History of colitis   . History of colon polyps    benign  . History of gastric ulcer   . History of hiatal hernia    "small one"  . History of MRSA infection 2017  . Hyperlipidemia    was on meds but has been off over a yr  . Hypertension  takes Amlodipine and Maxzide daily  . Insomnia    takes Ambien nightly  . Iron deficiency anemia    "when I was young"  . Lung nodules   . Microscopic hematuria   . Migraine    "2-3/wk" (12/03/2016)  . MS (multiple sclerosis) ([place])    questionable per pt  . Myocardial infarction ([place]) 2000   "mild one"  . Noncompliance   . Osteoporosis   . Peripheral neuropathy    weakness,numbness,and tingling. Takes Gabapentin daily  . Pneumonia 06/2015  . PTSD (post-traumatic stress disorder) dx'd 2016/2017    "was dx'd w/bipolar in 1991; replaced w/PTSD dx 2016/2017" (12/03/2016)  . Restless leg syndrome    takes Requip daily  . 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 Type II diabetes mellitus ([place])    "went off insulin in 2012/2013" (12/03/2016)   Past Surgical History:  Past Surgical History:  Procedure Laterality Date  . ABDOMINAL AORTOGRAM N/A 12/03/2016   Procedure: Abdominal Aortogram;  Surgeon: Angelia Mould, MD;  Location: [place] [facility];  Service: Cardiovascular;  Laterality: N/A;  . ABDOMINAL HYSTERECTOMY  12/1987  . ADENOIDECTOMY  1975  . ANKLE SURGERY Bilateral 1993; 1995 X2   "stabilzation done; 1 on the right; 2 on the left"  . APPENDECTOMY  1989  . BACK SURGERY    . BREAST LUMPECTOMY Left    "benign tumor"  . CARDIAC CATHETERIZATION N/A 01/31/2015   Procedure: Left Heart Cath and Coronary Angiography;  Surgeon: Burnell Blanks, MD;  Location: [place] [facility];  Service: Cardiovascular;  Laterality: N/A;  . CARDIAC CATHETERIZATION  2009; 2011  . COLONOSCOPY    . DILATION AND CURETTAGE OF UTERUS    . ESOPHAGOGASTRODUODENOSCOPY    . LAPAROSCOPIC CHOLECYSTECTOMY  1996  . NASAL RECONSTRUCTION  1976  . NASAL SINUS SURGERY  1975  . POSTERIOR LUMBAR FUSION  2005  . RADIOLOGY WITH ANESTHESIA N/A 01/15/2016   Procedure: MRI LUMBAR SPINE WITHOUT;  Surgeon: Medication Radiologist, MD;  Location: [place];  Service: Radiology;  Laterality: N/A;  . RADIOLOGY WITH ANESTHESIA N/A 06/29/2016   Procedure: MRI OF THE BRAIN WITH AND WITHOUT;  Surgeon: Medication Radiologist, MD;  Location: [place];  Service: Radiology;  Laterality: N/A;  . RENAL ANGIOGRAPHY N/A 12/03/2016   Procedure: Renal Angiography;  Surgeon: Angelia Mould, MD;  Location: [place] [facility];  Service: Cardiovascular;  Laterality: N/A;  . TONSILLECTOMY  1992  . TUBAL LIGATION    . TUMOR EXCISION  1998   from back  of skull; developed;  MRSA from the area that had to be packed   HPI:  Katrina [name] is a 56 y.o. female with medical history significant for history of CVA with residual deficits, accelerated hypertension, and COPD, now presenting to the emergency department with numerous complaints, including intermittent chest pain, headache, and left-sided weakness.  Patient reports recurrent, intermittent pain in the left chest since yesterday, lasting approximately 1 minute at a time without radiation, and without alleviating or exacerbating factors identified.  She also reports some left-sided weakness, but has difficulty determining the time of onset, noting that some of it may be chronic and it seems to wax and wane.  Patient was suffering from a headache that developed gradually several hours ago, eased-off spontaneously, now returns. The headache is moderate in intensity, dull in character, localized to behind the left eye, similar  to her chronic headaches, and without alleviating or exacerbating factors.  No fevers or chills and no significant dyspnea or cough.    Assessment / Plan / Recommendation Clinical Impression  Cognitive linguistic skills appear WNL during informal cognitive linguistic assessment. Pt initially presented with halting speech, however this dissipated after a few minutes. Pt moving oral motor structures WNL during conversational speech, however limited left facial, buccal, and lingual ROM during formal assessment. Pt reports that she has PTSD from childhood and cannot tolerate closed MRI. She also declined home health and outpatient rehab services when asked whether she was going to receive PT. No further SLP services indicated at this time, however SLP encouraged Pt to notify her PCP if she feels that her speech does not continue to improve for possible HH or OP SLP services (speech WNL as assessment progressed). SLP will sign off.     SLP Assessment  SLP Recommendation/Assessment:  Patient does not need any further Speech Lanaguage Pathology Services SLP Visit Diagnosis: Cognitive communication deficit (R41.841)    Follow Up Recommendations  None    Frequency and Duration           SLP Evaluation Cognition  Overall Cognitive Status: Within Functional Limits for tasks assessed Arousal/Alertness: Awake/alert Orientation Level: Oriented X4 Memory: Appears intact Awareness: Appears intact Problem Solving: Appears intact Behaviors: (moving right leg rapidly throughout evaluation) Safety/Judgment: Appears intact       Comprehension  Auditory Comprehension Overall Auditory Comprehension: Appears within functional limits for tasks assessed Yes/No Questions: Within Functional Limits Commands: Within Functional Limits Conversation: Complex Visual Recognition/Discrimination Discrimination: Within Function Limits Reading Comprehension Reading Status: Not tested    Expression Expression Primary Mode of Expression: Verbal Verbal Expression Overall Verbal Expression: Appears within functional limits for tasks assessed Initiation: No impairment Automatic Speech: Name;Social Response Level of Generative/Spontaneous Verbalization: Conversation Repetition: No impairment Naming: No impairment Pragmatics: No impairment Non-Verbal Means of Communication: Not applicable Written Expression Dominant Hand: Right Written Expression: Not tested   Oral / Motor  Oral Motor/Sensory Function Overall Oral Motor/Sensory Function: Mild impairment Facial ROM: Reduced left Facial Symmetry: Abnormal symmetry left Facial Strength: Reduced left Facial Sensation: Within Functional Limits Lingual ROM: Reduced left Lingual Symmetry: Abnormal symmetry left Lingual Strength: Within Functional Limits Lingual Sensation: Within Functional Limits Velum: Within Functional Limits Mandible: Within Functional Limits;Impaired(Appears WFL in convesation) Motor Speech Overall Motor  Speech: Appears within functional limits for tasks assessed Respiration: Within functional limits Phonation: Normal Resonance: Within functional limits Articulation: Within functional limitis Intelligibility: Intelligible Motor Planning: Witnin functional limits Motor Speech Errors: Not applicable   GO          Functional Assessment Tool Used: clinical judgment Functional Limitations: Spoken language expressive Spoken Language Expression Current Status [phone]): At least 1 percent but less than 20 percent impaired, limited or restricted Spoken Language Expression Goal Status [phone]): At least 1 percent but less than 20 percent impaired, limited or restricted Spoken Language Expression Discharge Status [phone]): At least 1 percent but less than 20 percent impaired, limited or restricted         Thank you,  Genene Churn, [place]  [place] 05/17/2017, 5:29 PM

## 2017-05-18 ENCOUNTER — Observation Stay (HOSPITAL_COMMUNITY): Payer: Medicare Other

## 2017-05-18 ENCOUNTER — Observation Stay (HOSPITAL_COMMUNITY)
Admit: 2017-05-18 | Discharge: 2017-05-18 | Disposition: A | Discharge status: Home / Self Care | Payer: Medicare Other | Attending: Neurology | Admitting: Neurology

## 2017-05-18 DIAGNOSIS — I639 Cerebral infarction, unspecified: Secondary | ICD-10-CM | POA: Diagnosis not present

## 2017-05-18 DIAGNOSIS — R0789 Other chest pain: Secondary | ICD-10-CM | POA: Diagnosis not present

## 2017-05-18 DIAGNOSIS — I10 Essential (primary) hypertension: Secondary | ICD-10-CM | POA: Diagnosis not present

## 2017-05-18 DIAGNOSIS — G35 Multiple sclerosis: Secondary | ICD-10-CM | POA: Diagnosis not present

## 2017-05-18 DIAGNOSIS — R29898 Other symptoms and signs involving the musculoskeletal system: Secondary | ICD-10-CM | POA: Diagnosis not present

## 2017-05-18 DIAGNOSIS — I6930 Unspecified sequelae of cerebral infarction: Secondary | ICD-10-CM | POA: Diagnosis not present

## 2017-05-18 DIAGNOSIS — E1140 Type 2 diabetes mellitus with diabetic neuropathy, unspecified: Secondary | ICD-10-CM | POA: Diagnosis not present

## 2017-05-18 DIAGNOSIS — R531 Weakness: Secondary | ICD-10-CM | POA: Diagnosis not present

## 2017-05-18 DIAGNOSIS — E1165 Type 2 diabetes mellitus with hyperglycemia: Secondary | ICD-10-CM | POA: Diagnosis not present

## 2017-05-18 LAB — MRSA PCR SCREENING: MRSA by PCR: NEGATIVE

## 2017-05-18 LAB — VITAMIN B12: Vitamin B-12: 223 pg/mL (ref 180–914)

## 2017-05-18 MED ORDER — METHYLPREDNISOLONE SODIUM SUCC 1000 MG IJ SOLR
INTRAMUSCULAR | Status: AC
  Filled 2017-05-18: qty 8

## 2017-05-18 MED ORDER — NICOTINE 21 MG/24HR TD PT24
21.0000 mg | MEDICATED_PATCH | Freq: Every day | TRANSDERMAL | Status: DC
  Administered 2017-05-18 – 2017-05-23 (×6): 21 mg via TRANSDERMAL
  Filled 2017-05-18 (×6): qty 1

## 2017-05-18 MED ORDER — SODIUM CHLORIDE 0.9 % IV SOLN
1000.0000 mg | INTRAVENOUS | Status: AC
  Administered 2017-05-18 – 2017-05-21 (×3): 1000 mg via INTRAVENOUS
  Filled 2017-05-18 (×5): qty 8

## 2017-05-18 NOTE — Progress Notes (Signed)
Physical Therapy Treatment Patient Details Name: DANILYN COCKE MRN: [id number] DOB: 07/20/1960 Today's Date: 05/18/2017    History of Present Illness Katrina [name] is a 56 y.o. female with medical history significant for history of CVA with residual deficits, accelerated hypertension, and COPD, now presenting to the emergency department with numerous complaints, including intermittent chest pain, headache, and left-sided weakness.  Patient reports recurrent, intermittent pain in the left chest since yesterday, lasting approximately 1 minute at a time without radiation, and without alleviating or exacerbating factors identified.  She also reports some left-sided weakness, but has difficulty determining the time of onset, noting that some of it may be chronic and it seems to wax and wane.  Patient was suffering from a headache that developed gradually several hours ago, eased-off spontaneously, now returns. The headache is moderate in intensity, dull in character, localized to behind the left eye, similar to her chronic headaches, and without alleviating or exacerbating factors.  No fevers or chills and no significant dyspnea or cough.    PT Comments    Patient presents up in chair and received stretching to left heel cords for 3-4 minutes prior to gait training, patient continues to drag left toes during gait training and can use LUE to lean on side rail in hallway to help maintain balance.  Patient will benefit from continued physical therapy in hospital and recommended venue below to increase strength, balance, endurance for safe ADLs and gait. PLAN: stair training next visit    Follow Up Recommendations  Home health PT;Supervision - Intermittent     Equipment Recommendations  Other (comment)(AFO for left ankle foot drop)    Recommendations for Other Services       Precautions / Restrictions Precautions Precautions: Fall Restrictions Weight Bearing Restrictions: No    Mobility  Bed Mobility               General bed mobility comments: Patient presents seated in chair  Transfers Overall transfer level: Needs assistance Equipment used: Straight cane Transfers: Sit to/from Stand;Stand Pivot Transfers Sit to Stand: Supervision Stand pivot transfers: Supervision          Ambulation/Gait Ambulation/Gait assistance: Min guard Ambulation Distance (Feet): 25 Feet Assistive device: Straight cane Gait Pattern/deviations: Decreased step length - left;Decreased stance time - left;Decreased dorsiflexion - left;Decreased weight shift to left   Gait velocity interpretation: Below normal speed for age/gender General Gait Details: demonstrates slow labored cadence with dragging of left toes, occasional use of siderails with LUE for support, 1 near loss of balance - able to self recover, limited secondary to c/o fatigue   Stairs            Wheelchair Mobility    Modified Rankin (Stroke Patients Only)       Balance Overall balance assessment: Needs assistance Sitting-balance support: No upper extremity supported;Feet supported Sitting balance-Leahy Scale: Good     Standing balance support: Single extremity supported;During functional activity Standing balance-Leahy Scale: Fair Standing balance comment: using SPC                            Cognition Arousal/Alertness: Awake/alert Behavior During Therapy: WFL for tasks assessed/performed Overall Cognitive Status: Within Functional Limits for tasks assessed                                        Exercises  General Exercises - Lower Extremity Long Arc Quad: Seated;AROM;Strengthening;Both;10 reps Hip Flexion/Marching: Seated;AROM;Strengthening;Both;10 reps;AAROM(required active assistance for left hip) Toe Raises: Seated;AROM;Strengthening;Both;10 reps Heel Raises: AROM;Seated;Strengthening;Both;10 reps    General Comments        Pertinent Vitals/Pain Pain  Assessment: No/denies pain    Home Living                      Prior Function            PT Goals (current goals can now be found in the care plan section) Acute Rehab PT Goals Patient Stated Goal: return home PT Goal Formulation: With patient Time For Goal Achievement: 05/21/17 Potential to Achieve Goals: Good Progress towards PT goals: Progressing toward goals    Frequency    7X/week      PT Plan Current plan remains appropriate    Co-evaluation              AM-PAC PT "6 Clicks" Daily Activity  Outcome Measure  Difficulty turning over in bed (including adjusting bedclothes, sheets and blankets)?: Unable Difficulty moving from lying on back to sitting on the side of the bed? : Unable Difficulty sitting down on and standing up from a chair with arms (e.g., wheelchair, bedside commode, etc,.)?: A Little Help needed moving to and from a bed to chair (including a wheelchair)?: A Little Help needed walking in hospital room?: A Little Help needed climbing 3-5 steps with a railing? : A Lot 6 Click Score: 13    End of Session   Activity Tolerance: Patient tolerated treatment well;Patient limited by fatigue Patient left: in chair;with call bell/phone within reach Nurse Communication: Mobility status PT Visit Diagnosis: Unsteadiness on feet (R26.81);Other abnormalities of gait and mobility (R26.89);Muscle weakness (generalized) (M62.81)     Time: 7846-9629 PT Time Calculation (min) (ACUTE ONLY): 20 min  Charges:  $Gait Training: 8-22 mins $Therapeutic Exercise: 8-22 mins                    G Codes:  Functional Assessment Tool Used: AM-PAC 6 Clicks Basic Mobility Functional Limitation: Mobility: Walking and moving around Mobility: Walking and Moving Around Current Status ([id number]): At least 40 percent but less than 60 percent impaired, limited or restricted Mobility: Walking and Moving Around Goal Status [phone]): At least 40 percent but less than 60 percent  impaired, limited or restricted Mobility: Walking and Moving Around Discharge Status [phone]): At least 40 percent but less than 60 percent impaired, limited or restricted    1:46 PM, 05/18/17 Lonell Grandchild, MPT Physical Therapist with [facility] [phone] office [phone] mobile phone

## 2017-05-18 NOTE — Progress Notes (Signed)
PROGRESS NOTE    Katrina [name]  KNL:[id number] DOB: Sep 20, 1960 DOA: 05/16/2017 PCP: Berkley Harvey, NP   Brief Narrative:  56 year old female with a history of gnawing hypertension, COPD, depression, and stroke with left hemiparesis 2016 presented with 1 day history of left-sided chest discomfort underneath her left breast radiating to the shoulder, worsening left-sided weakness on the evening of May 16, 2017, and headache.  The patient states that she had left-sided chest discomfort that has been on and off since afternoon of May 16, 2017 with some shortness of breath.  The patient states that she chronically has shortness of breath, but denied any nausea, vomiting, presyncope.  She denies any fevers, chills, vomiting, diarrhea, abdominal pain, dysuria, hematuria.  While walking to the living room around dinnertime, the patient noted worsening of her left upper and left lower extremity weakness.  She states there is worse than her usual baseline weakness.  She also had associated headache behind her left eye without any visual disturbance, dysarthria, dysesthesia.  She continues Symbicort and 1/2 packs/day.  She endorses compliance with all her medications.  Because of complaints, she presented for further evaluation.     Assessment & Plan:   Principal Problem:   CVA (cerebral vascular accident) [facility]) Active Problems:   Moderate COPD (chronic obstructive pulmonary disease) (HCC)   Uncontrolled hypertension   Type 2 diabetes, uncontrolled, with neuropathy (HCC)   Left-sided weakness   Atypical chest pain   Stroke (cerebrum) (HCC)   History of CVA with residual deficit   Left hemiparesis -While the patient may have some baseline left hemiparesis, components of her physical exam are Nonphysiologic -suspect she has some functional deficits -Neurology Consulted, MRI performed this morning, because patient was started on Plavix unable to get a lumbar puncture at this time (patient  is very upset by this stating that she was not unprepped Plavix since July and now because of possibly 1 dose she will not be able to get her lumbar puncture which she feels she needs during this hospitalization) -PT recommending home health care/OT recommending outpatient OT -CTA H&N--no intracranial large vessel occlusion; 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 Systolic function was   vigorous. The estimated ejection fraction was in the range of 65%   to 70%. Wall motion was normal; there were no regional wall   motion abnormalities. Doppler parameters are consistent with   abnormal left ventricular relaxation (grade 1 diastolic   dysfunction). Doppler parameters are consistent with high   ventricular filling pressure. Mild concentric and mild to   moderate focal basal septal hypertrophy. -LDL--69 -Antiplatelet--aspirin, Plavix (was not taking plavix at home) -Awaiting further recommendations from neurology (may be able to discharge on 05/19/2017 if no further workup is necessary)  Atypical chest pain -Troponins negative x3 -Echocardiogram: Left ventricle: The cavity size was normal. Systolic function was   vigorous. The estimated ejection fraction was in the range of 65%   to 70%. Wall motion was normal; there were no regional wall   motion abnormalities. Doppler parameters are consistent with   abnormal left ventricular relaxation (grade 1 diastolic   dysfunction). Doppler parameters are consistent with high   ventricular filling pressure. Mild concentric and mild to   moderate focal basal septal hypertrophy. -LDL 69 -Coronary angiography on 01/31/15 showed no angiographic evidence of coronary disease with normal left ventricle systolic function  -  Continue telemetry  Malignant  hypertension -Patient presented with blood pressure 222/101 -allowing permissive hypertension in first 24 hours -July 2018 renal angiogram--negative for renal artery stenosis  COPD/tobacco abuse -Stable on room air -Tobacco cessation discussed  Diabetes mellitus type 2 -December 04, 2016 hemoglobin A1c 5.8 -Hemoglobin A1c of 5.9  Chronic daily headache -We will need outpatient neurology follow-up preferably with a headache specialist  GERD -continue PPI     DVT prophylaxis: lovenox Code Status: Full code Family Communication: No family bedside he was discharged Disposition Plan: Pending evaluation by neurology, may be able to discharge on 05/19/2017 if no further evaluation and workup is necessary   Consultants:   OT  PT  Procedures:   EEG on 05/18/17  Antimicrobials:   None   Subjective: Patient seen and evaluated.  She voices that she has a significant headache.  She says that she would like to be taking a heart healthy diet and be given a regular diet because she is allergic to the [place] that is being sent up.  She voices she is upset because she was told that her MRI needed to have contrast dye and it did not.  She is wanting to get her lumbar puncture done while inpatient.  Objective: Vitals:   05/17/17 2131 05/18/17 0131 05/18/17 0531 05/18/17 0931  BP: (!) 188/87 (!) 155/78 (!) 160/77 (!) 197/97  Pulse: 75 73  89  Resp: 18 16 16 18   Temp: 98.6 F (37 C) 98.2 F (36.8 C) 98.8 F (37.1 C) 97.9 F (36.6 C)  TempSrc: Oral Oral Oral Oral  SpO2: 98% 98% 98% 98%  Weight:      Height:        Intake/Output Summary (Last 24 hours) at 05/18/2017 1358 Last data filed at 05/18/2017 0900 Gross per 24 hour  Intake 240 ml  Output -  Net 240 ml   Filed Weights   05/16/17 2032 05/17/17 0122 05/17/17 0500  Weight: 97.5 kg (215 lb) 98.2 kg (216 lb 7.9 oz) 98.2 kg (216 lb 7.9 oz)    Examination:  General exam: Agitated Respiratory system: Clear to  auscultation. Respiratory effort normal. Cardiovascular system: S1 & S2 heard, RRR. No JVD, murmurs, rubs, gallops or clicks. No pedal edema. Gastrointestinal system: Abdomen is obese, nondistended, soft and nontender. No organomegaly or masses felt. Normal bowel sounds heard. Central nervous system: Alert and oriented.  Weakness at ankle on left side Extremities: Weakness on ankle on left side, foot drop appreciated by PT Skin: No rashes, lesions or ulcers Psychiatry: Agitated    Data Reviewed: I have personally reviewed following labs and imaging studies  CBC: Recent Labs  Lab 05/16/17 2050 05/16/17 2111  WBC 12.9*  --   NEUTROABS 6.1  --   HGB 13.2 13.9  HCT 41.7 41.0  MCV 92.5  --   PLT 281  --    Basic Metabolic Panel: Recent Labs  Lab 05/16/17 2050 05/16/17 2111  NA 138 141  K 4.0 4.1  CL 105 101  CO2 25  --   GLUCOSE 150* 149*  BUN 25* 23*  CREATININE 1.04* 1.00  CALCIUM 9.1  --    GFR: Estimated Creatinine Clearance: 74.3 mL/min (by C-G formula based on SCr of 1 mg/dL). Liver Function Tests: Recent Labs  Lab 05/16/17 2050  AST 18  ALT 14  ALKPHOS 81  BILITOT 0.2*  PROT 7.0  ALBUMIN 3.7   Recent Labs  Lab 05/16/17 2050  LIPASE 26   No  results for input(s): AMMONIA in the last 168 hours. Coagulation Profile: Recent Labs  Lab 05/16/17 2050  INR 1.01   Cardiac Enzymes: Recent Labs  Lab 05/16/17 2050 05/17/17 0207  TROPONINI <0.03 <0.03   BNP (last 3 results) No results for input(s): PROBNP in the last 8760 hours. HbA1C: Recent Labs    05/17/17 0207  HGBA1C 5.9*   CBG: Recent Labs  Lab 05/16/17 2102  GLUCAP 145*   Lipid Profile: Recent Labs    05/17/17 0207  CHOL 123  HDL 34*  LDLCALC 69  TRIG 98  CHOLHDL 3.6   Thyroid Function Tests: No results for input(s): TSH, T4TOTAL, FREET4, T3FREE, THYROIDAB in the last 72 hours. Anemia Panel: No results for input(s): VITAMINB12, FOLATE, FERRITIN, TIBC, IRON, RETICCTPCT in the  last 72 hours. Sepsis Labs: No results for input(s): PROCALCITON, LATICACIDVEN in the last 168 hours.  Recent Results (from the past 240 hour(s))  MRSA PCR Screening     Status: None   Collection Time: 05/18/17 10:51 AM  Result Value Ref Range Status   MRSA by PCR NEGATIVE NEGATIVE Final    Comment:        The GeneXpert MRSA Assay (FDA approved for NASAL specimens only), is one component of a comprehensive MRSA colonization surveillance program. It is not intended to diagnose MRSA infection nor to guide or monitor treatment for MRSA infections.          Radiology Studies: Ct Angio Head W Or Wo Contrast  Result Date: 05/16/2017 CLINICAL DATA:  Sudden onset left-sided weakness EXAM: CT ANGIOGRAPHY HEAD AND NECK TECHNIQUE: Multidetector CT imaging of the head and neck was performed using the standard protocol during bolus administration of intravenous contrast. Multiplanar CT image reconstructions and MIPs were obtained to evaluate the vascular anatomy. Carotid stenosis measurements (when applicable) are obtained utilizing NASCET criteria, using the distal internal carotid diameter as the denominator. CONTRAST:  138mL ISOVUE-370 IOPAMIDOL (ISOVUE-370) INJECTION 76% COMPARISON:  Head CT 05/16/2017 Head CT 04/25/2017 FINDINGS: CTA NECK FINDINGS Aortic arch: There is minimal calcific atherosclerosis of the aortic arch. There is no aneurysm, dissection or hemodynamically significant stenosis of the visualized ascending aorta and aortic arch. Conventional 3 vessel aortic branching pattern. The visualized proximal subclavian arteries are normal. Right carotid system: The right common carotid origin is widely patent. There is no common carotid or internal carotid artery dissection or aneurysm. Atherosclerotic calcification at the carotid bifurcation without hemodynamically significant stenosis. Left carotid system: The left common carotid origin is widely patent. There is no common carotid or  internal carotid artery dissection or aneurysm. Atherosclerotic calcification at the carotid bifurcation without hemodynamically significant stenosis. Vertebral arteries: The vertebral system is right dominant. The left vertebral artery is diminutive along its entire course and its origin is not visible. Minimal opacification begins at the C4 level. The remainder of the left vertebral artery is severely stenotic, worsened from the CTA obtained 10/01/2015. The right vertebral artery is normal to its confluence with the basilar artery. Skeleton: There is no bony spinal canal stenosis. No lytic or blastic lesions. Other neck: The nasopharynx is clear. The oropharynx and hypopharynx are normal. The epiglottis is normal. The supraglottic larynx, glottis and subglottic larynx are normal. No retropharyngeal collection. The parapharyngeal spaces are preserved. The parotid and submandibular glands are normal. No sialolithiasis or salivary ductal dilatation. The thyroid gland is normal. There is no cervical lymphadenopathy. Upper chest: No pneumothorax or pleural effusion. No nodules or masses. Review of the MIP images confirms  the above findings CTA HEAD FINDINGS Anterior circulation: --Intracranial internal carotid arteries: Normal. --Anterior cerebral arteries: Normal. --Middle cerebral arteries: Patent. Mild atherosclerotic narrowing of the right middle cerebral artery without hemodynamically significant stenosis. Normal left MCA. --Posterior communicating arteries: Present on the right, absent on the left. Posterior circulation: --Posterior cerebral arteries: Normal. --Superior cerebellar arteries: Normal. --Basilar artery: Normal. --Anterior inferior cerebellar arteries: Normal. --Posterior inferior cerebellar arteries: Normal. Venous sinuses: As permitted by contrast timing, patent. Anatomic variants: None Delayed phase: No parenchymal contrast enhancement. Review of the MIP images confirms the above findings  IMPRESSION: 1. No emergent intracranial large vessel occlusion. 2. 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 This is probably a chronic finding and would not account for the reported left-sided weakness. The intracranial posterior circulation is entirely patent. 3. Mild bilateral carotid bifurcation atherosclerotic calcification without hemodynamically significant stenosis. Aortic Atherosclerosis (ICD10-I70.0). These results were called by telephone at the time of interpretation on 05/16/2017 at 10:13 pm to Dr. Criss Rosales, who verbally acknowledged these results. Electronically Signed   By: Ulyses Jarred M.D.   On: 05/16/2017 22:13   Dg Chest 1 View  Result Date: 05/16/2017 CLINICAL DATA:  Chest pain EXAM: CHEST 1 VIEW COMPARISON:  04/17/2017 FINDINGS: The heart size and mediastinal contours are within normal limits. Both lungs are clear. The visualized skeletal structures are unremarkable. IMPRESSION: No active disease. Electronically Signed   By: Inez Catalina M.D.   On: 05/16/2017 21:53   Ct Angio Neck W And/or Wo Contrast  Result Date: 05/16/2017 CLINICAL DATA:  Sudden onset left-sided weakness EXAM: CT ANGIOGRAPHY HEAD AND NECK TECHNIQUE: Multidetector CT imaging of the head and neck was performed using the standard protocol during bolus administration of intravenous contrast. Multiplanar CT image reconstructions and MIPs were obtained to evaluate the vascular anatomy. Carotid stenosis measurements (when applicable) are obtained utilizing NASCET criteria, using the distal internal carotid diameter as the denominator. CONTRAST:  197mL ISOVUE-370 IOPAMIDOL (ISOVUE-370) INJECTION 76% COMPARISON:  Head CT 05/16/2017 Head CT 04/25/2017 FINDINGS: CTA NECK FINDINGS Aortic arch: There is minimal calcific atherosclerosis of the aortic arch. There is no aneurysm, dissection or hemodynamically significant stenosis of the visualized ascending  aorta and aortic arch. Conventional 3 vessel aortic branching pattern. The visualized proximal subclavian arteries are normal. Right carotid system: The right common carotid origin is widely patent. There is no common carotid or internal carotid artery dissection or aneurysm. Atherosclerotic calcification at the carotid bifurcation without hemodynamically significant stenosis. Left carotid system: The left common carotid origin is widely patent. There is no common carotid or internal carotid artery dissection or aneurysm. Atherosclerotic calcification at the carotid bifurcation without hemodynamically significant stenosis. Vertebral arteries: The vertebral system is right dominant. The left vertebral artery is diminutive along its entire course and its origin is not visible. Minimal opacification begins at the C4 level. The remainder of the left vertebral artery is severely stenotic, worsened from the CTA obtained 10/01/2015. The right vertebral artery is normal to its confluence with the basilar artery. Skeleton: There is no bony spinal canal stenosis. No lytic or blastic lesions. Other neck: The nasopharynx is clear. The oropharynx and hypopharynx are normal. The epiglottis is normal. The supraglottic larynx, glottis and subglottic larynx are normal. No retropharyngeal collection. The parapharyngeal spaces are preserved. The parotid and submandibular glands are normal. No sialolithiasis or salivary ductal dilatation. The thyroid gland is normal. There is no cervical lymphadenopathy. Upper chest: No pneumothorax or pleural effusion.  No nodules or masses. Review of the MIP images confirms the above findings CTA HEAD FINDINGS Anterior circulation: --Intracranial internal carotid arteries: Normal. --Anterior cerebral arteries: Normal. --Middle cerebral arteries: Patent. Mild atherosclerotic narrowing of the right middle cerebral artery without hemodynamically significant stenosis. Normal left MCA. --Posterior  communicating arteries: Present on the right, absent on the left. Posterior circulation: --Posterior cerebral arteries: Normal. --Superior cerebellar arteries: Normal. --Basilar artery: Normal. --Anterior inferior cerebellar arteries: Normal. --Posterior inferior cerebellar arteries: Normal. Venous sinuses: As permitted by contrast timing, patent. Anatomic variants: None Delayed phase: No parenchymal contrast enhancement. Review of the MIP images confirms the above findings IMPRESSION: 1. No emergent intracranial large vessel occlusion. 2. 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 This is probably a chronic finding and would not account for the reported left-sided weakness. The intracranial posterior circulation is entirely patent. 3. Mild bilateral carotid bifurcation atherosclerotic calcification without hemodynamically significant stenosis. Aortic Atherosclerosis (ICD10-I70.0). These results were called by telephone at the time of interpretation on 05/16/2017 at 10:13 pm to Dr. Criss Rosales, who verbally acknowledged these results. Electronically Signed   By: Ulyses Jarred M.D.   On: 05/16/2017 22:13   Mr Brain Wo Contrast  Result Date: 05/18/2017 CLINICAL DATA:  56 year old female with altered mental status for 2 days. EXAM: MRI HEAD WITHOUT CONTRAST TECHNIQUE: Multiplanar, multiecho pulse sequences of the brain and surrounding structures were obtained without intravenous contrast. COMPARISON:  Brain MRI 06/29/2016 and earlier. CTA head and neck 05/16/2017. FINDINGS: The examination had to be discontinued prior to completion due to claustrophobia. Intermittently motion degraded diffusion-weighted imaging, sagittal T1 weighted imaging, axial T2 and FLAIR imaging was obtained. Brain: Stable cerebral volume. No midline shift, mass effect, or evidence of intracranial mass lesion. No ventriculomegaly. No evidence of acute intracranial  hemorrhage or extra-axial collection. No restricted diffusion or evidence of acute infarction. Patchy and scattered chronic bilateral cerebral white matter T2 and FLAIR hyperintensity appears similar to the January MRI. Suggestion of new T2 hyperintensity in the left caudate nucleus near the left frontal horn since January with facilitated diffusion (series 14, image 14). Stable patchy T2 hyperintensity in the left pons. No cortical encephalomalacia identified. Vascular: Major intracranial vascular flow voids are grossly stable since the January MRI. Skull and upper cervical spine: Not well evaluated. Sinuses/Orbits: Appear stable. Other: Stable trace mastoid fluid. Small volume retained secretions in the nasopharynx today. Stable scalp soft tissues. IMPRESSION: 1. Intermittently motion degraded study which had to be discontinued prior to completion despite repeated imaging attempts. 2.  No acute intracranial abnormality identified. 3. Questionable chronic lacunar infarct in the left caudate which is new since January. Otherwise stable nonspecific cerebral white matter and pontine signal changes. Electronically Signed   By: Genevie Ann M.D.   On: 05/18/2017 08:59   Ct Head Code Stroke Wo Contrast`  Result Date: 05/16/2017 CLINICAL DATA:  Code stroke.  Sudden onset left-sided weakness EXAM: CT HEAD WITHOUT CONTRAST TECHNIQUE: Contiguous axial images were obtained from the base of the skull through the vertex without intravenous contrast. COMPARISON:  Head CT 04/25/2017 FINDINGS: Brain: No mass lesion or acute hemorrhage. No focal hypoattenuation of the basal ganglia or cortex to indicate infarcted tissue. No hydrocephalus or age advanced atrophy. Vascular: No hyperdense vessel. No advanced atherosclerotic calcification of the arteries at the skull base. Skull: Normal visualized skull base, calvarium and extracranial soft tissues. Sinuses/Orbits: No sinus fluid levels or advanced mucosal thickening. No mastoid  effusion. Normal orbits.  ASPECTS [facility] Stroke Program Early CT Score) - Ganglionic level infarction (caudate, lentiform nuclei, internal capsule, insula, M1-M3 cortex): 7 - Supraganglionic infarction (M4-M6 cortex): 3 Total score (0-10 with 10 being normal): 10 IMPRESSION: 1. Normal head CT. 2. ASPECTS is 10. These results were called by telephone at the time of interpretation on 05/16/2017 at 9:16 pm to Dr. Ezequiel Essex , who verbally acknowledged these results. Electronically Signed   By: Ulyses Jarred M.D.   On: 05/16/2017 21:16        Scheduled Meds: .  stroke: mapping our early stages of recovery book   Does not apply Once  . aspirin  324 mg Oral Once  . aspirin  300 mg Rectal Daily   Or  . aspirin  325 mg Oral Daily  . clopidogrel  75 mg Oral Daily  . enoxaparin (LOVENOX) injection  40 mg Subcutaneous Q24H  . gabapentin  600 mg Oral QID  . pantoprazole  40 mg Oral Daily  . rOPINIRole  4 mg Oral QHS   Continuous Infusions:   LOS: 0 days    Time spent: 35 minutes    Loretha Stapler, MD [facility] Pager [phone]  If 7PM-7AM, please contact night-coverage [email or web address] Password [email or web address] 05/18/2017, 1:58 PM

## 2017-05-18 NOTE — Progress Notes (Addendum)
Patient sitting in chair. Patient is high fall risk, but refuses bed alarm and chair alarm. Educated patient that she is fall risk, but patient states she is able to get up alone.  Patient is alert and oriented. Patient educated on using call light for help with tasks and ambulation.

## 2017-05-18 NOTE — Progress Notes (Signed)
EEG completed; results pending.    

## 2017-05-18 NOTE — Progress Notes (Addendum)
Katrina A. Merlene Laughter, MD     [email or web address]          Katrina [name] is an 56 y.o. female.   ASSESSMENT/PLAN: 1. Recurrent episodes of left-sided numbness, weakness and tingling of unclear etiology: Depression diagnosis includes complicated migraine headache/my headaches with aura, multiple sclerosis, psychogenic disorders, complications from elevated blood pressure and ischemic stroke. Ischemic stroke is less likely however given the the recurrent nature and negative imaging. Physical examination showed evidence that suggest organic weakness but with evidence of none physiologic/nonanatomic overlay. The MRI findings are worrisome for multiple sclerosis. As a result, a spot tablet be arranged. Also, IV steroids will be given for 2-3 days. Note that the patient has not been on Plavix or aspirin in months.     She reports overall she is about the same. She is having some dizziness on standing although this is mild. She also has some balance problems on standing. She complains of headaches. Left side is about the same. The patient has not taken Plavix in months and does not take aspirin. She has refused to take these medications in the hospital.  GENERAL: This a pleasant lady who is in no acute distress.  HEENT: Supple neck and no trauma appreciated.  ABDOMEN: soft  EXTREMITIES: No edema   BACK: This is normal.  SKIN: Normal by inspection.    MENTAL STATUS: Alert and oriented. Speech, language and cognition are generally intact. Judgment and insight normal.   CRANIAL NERVES: Pupils are equal, round and reactive to light and accomodation; extra ocular movements are full, there is no significant nystagmus; visual fields are full; she has a more significant weakness of the left lower facial muscles. Interestingly, tongue deviates towards the right - away from the weak side which is different than expected, uvula is somewhat midline; shoulder shrug is weak on the  left  MOTOR: Right deltoid 4+/5, right hand grip 5. Right hip flexion and dorsiflexion 5/5. Left deltoid 4+/5, left tricep 4 minus with clear evidence of giveaway weakness, hand grip 5. Left hip flexion 3/5 in dorsiflexion 3.  COORDINATION: No dysmetria or tremors are noted on the right side. No clear dysmetria or tremors on the left side.        Blood pressure (!) 184/100, pulse 88, temperature 98.4 F (36.9 C), temperature source Oral, resp. rate 20, height '5\' 6"'  (1.676 m), weight 216 lb 7.9 oz (98.2 kg), SpO2 95 %.  Past Medical History:  Diagnosis Date  . Allergy   . Anemia 1975-1976   . Anginal pain ([place])   . Anxiety    takes Valium daily as needed  . Arthritis    "spine" (12/03/2016)  . Asthma    has inhalers but doesn't use (12/03/2016)  . CAD (coronary artery disease)    Moderate nonobstructive 2012-2013, [place]; No angiographic evidence of CAD 01/31/15 [facility]  . Chronic bronchitis ([place])    "get it a couple times q yr" (12/03/2016)  . Chronic kidney disease   . Chronic lower back pain    budlging disc   . Claustrophobia   . COPD (chronic obstructive pulmonary disease) ([place])   . Daily headache   . Depression   . Diverticulitis   . Family history of adverse reaction to anesthesia    2 daughters gets extremely sick   . GERD (gastroesophageal reflux disease)    takes Dexilant daily  . Heart murmur   . History of blood transfusion 1975-1976 "several"   no abnormal reaction  noted  . History of colitis   . History of colon polyps    benign  . History of gastric ulcer   . History of hiatal hernia    "small one"  . History of MRSA infection 2017  . Hyperlipidemia    was on meds but has been off over a yr  . Hypertension    takes Amlodipine and Maxzide daily  . Insomnia    takes Ambien nightly  . Iron deficiency anemia    "when I was young"  . Lung nodules   . Microscopic hematuria   . Migraine    "2-3/wk" (12/03/2016)  . MS (multiple sclerosis) ([place])     questionable per pt  . Myocardial infarction ([place]) 2000   "mild one"  . Noncompliance   . Osteoporosis   . Peripheral neuropathy    weakness,numbness,and tingling. Takes Gabapentin daily  . Pneumonia 06/2015  . PTSD (post-traumatic stress disorder) dx'd 2016/2017   "was dx'd w/bipolar in 1991; replaced w/PTSD dx 2016/2017" (12/03/2016)  . Restless leg syndrome    takes Requip daily  . 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 Type II diabetes mellitus ([place])    "went off insulin in 2012/2013" (12/03/2016)    Past Surgical History:  Procedure Laterality Date  . ABDOMINAL AORTOGRAM N/A 12/03/2016   Procedure: Abdominal Aortogram;  Surgeon: Angelia Mould, MD;  Location: [facility];  Service: Cardiovascular;  Laterality: N/A;  . ABDOMINAL HYSTERECTOMY  12/1987  . ADENOIDECTOMY  1975  . ANKLE SURGERY Bilateral 1993; 1995 X2   "stabilzation done; 1 on the right; 2 on the left"  . APPENDECTOMY  1989  . BACK SURGERY    . BREAST LUMPECTOMY Left    "benign tumor"  . CARDIAC CATHETERIZATION N/A 01/31/2015   Procedure: Left Heart Cath and Coronary Angiography;  Surgeon: Burnell Blanks, MD;  Location: [place] [facility];  Service: Cardiovascular;  Laterality: N/A;  . CARDIAC CATHETERIZATION  2009; 2011  . COLONOSCOPY    . DILATION AND CURETTAGE OF UTERUS    . ESOPHAGOGASTRODUODENOSCOPY    . LAPAROSCOPIC CHOLECYSTECTOMY  1996  . NASAL RECONSTRUCTION  1976  . NASAL SINUS SURGERY  1975  . POSTERIOR LUMBAR FUSION  2005  . RADIOLOGY WITH ANESTHESIA N/A 01/15/2016   Procedure: MRI LUMBAR SPINE WITHOUT;  Surgeon: Medication Radiologist, MD;  Location: [place];  Service: Radiology;  Laterality: N/A;  . RADIOLOGY WITH ANESTHESIA N/A 06/29/2016   Procedure: MRI OF THE BRAIN WITH AND WITHOUT;  Surgeon: Medication Radiologist, MD;  Location: [place];  Service: Radiology;  Laterality: N/A;  . RENAL  ANGIOGRAPHY N/A 12/03/2016   Procedure: Renal Angiography;  Surgeon: Angelia Mould, MD;  Location: [place] [facility];  Service: Cardiovascular;  Laterality: N/A;  . TONSILLECTOMY  1992  . TUBAL LIGATION    . 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 Coronary artery disease Father   . Emphysema Father   . Heart attack Father   . Stroke Father   . Cancer Father        Unsure of type   . Allergic rhinitis Father   . Depression Mother   . Cancer Mother        skin cancer  .  Bipolar disorder Brother   . Drug abuse Brother   . Cancer Brother        leukemia  . Bipolar disorder Daughter   . Allergic rhinitis Daughter   . Asthma Daughter   . Urticaria Daughter   . Diabetes Maternal Grandmother   . Stomach cancer Maternal Grandfather   . Cancer Maternal Grandfather        stomach   . Angioedema Neg Hx   . Atopy Neg Hx   . Eczema Neg Hx   . Immunodeficiency Neg Hx     Social History:  reports that she has been smoking cigarettes.  She started smoking about 46 years ago. She has a 90.00 pack-year smoking history. she has never used smokeless tobacco. She reports that she does not drink alcohol or use drugs.  Allergies:  Allergies  Allergen Reactions  . Other Anaphylaxis and Swelling    [place]  . Penicillins Anaphylaxis    Has patient had a PCN reaction causing immediate rash, facial/tongue/throat swelling, SOB or lightheadedness with hypotension: Yes Has patient had a PCN reaction causing severe rash involving mucus membranes or skin necrosis: Yes Has patient had a PCN reaction that required hospitalization Yes Has patient had a PCN reaction occurring within the last 10 years: No If all of the above answers are "NO", then may proceed with Cephalosporin use.   . Zithromax [Azithromycin] Anaphylaxis  . Aspirin Other (See Comments)    Due to stomach ulcers.   .  Pineapple Rash  . Strawberry Extract Rash and Hives  . Wheat Bran Other (See Comments)    Sneezing, rhinorrhea  . Yeast-Related Products Other (See Comments)    Sneezing, rhinorrhea  . Aspartame And Phenylalanine Palpitations  . Mushroom Extract Complex Rash  . Nicardipine Nausea And Vomiting and Other (See Comments)    shaking    Medications: Prior to Admission medications   Medication Sig Start Date End Date Taking? Authorizing Provider  albuterol (PROVENTIL HFA;VENTOLIN HFA) 108 (90 Base) MCG/ACT inhaler Inhale 2 puffs into the lungs every 6 (six) hours as needed. 06/09/16 06/09/17 Yes [provider]  amLODipine (NORVASC) 10 MG tablet Take 1 tablet (10 mg total) by mouth daily. 06/05/14  Yes Herminio Commons, MD  aspirin-acetaminophen-caffeine (EXCEDRIN MIGRAINE) [id number] MG tablet Take 2 tablets by mouth daily as needed for migraine.    Yes [provider]  atenolol (TENORMIN) 50 MG tablet Take 1 tablet (50 mg total) by mouth daily. 05/02/17  Yes Herminio Commons, MD  celecoxib (CELEBREX) 200 MG capsule Take 200 mg by mouth at bedtime. 05/02/17  Yes [provider]  chlorthalidone (HYGROTON) 25 MG tablet Take 25 mg by mouth 2 (two) times daily.    Yes [provider]  clopidogrel (PLAVIX) 75 MG tablet Take 75 mg by mouth daily.    Yes [provider]  dexlansoprazole (DEXILANT) 60 MG capsule Take 1 tab by mouth every morning. Patient taking differently: Take 60 mg by mouth every evening.  10/18/16  Yes Esterwood, Amy S, PA-C  furosemide (LASIX) 20 MG tablet Take 1 tablet (20 mg total) by mouth daily. 05/02/17 07/31/17 Yes Herminio Commons, MD  gabapentin (NEURONTIN) 600 MG tablet Take 600 mg by mouth 4 (four) times daily.   Yes [provider]  hydrALAZINE (APRESOLINE) 50 MG tablet Take 1 tablet (50 mg total) by mouth 3 (three) times daily. 05/02/17  Yes Herminio Commons, MD  losartan (COZAAR) 50 MG tablet Take 50 mg by  mouth 2 (two) times daily.  06/09/16  Yes [provider]  methocarbamol (ROBAXIN) 500 MG tablet Take 1 tablet by mouth every 6 (six) hours as needed for muscle spasms.  05/02/17  Yes [provider]  potassium chloride SA (K-DUR,KLOR-CON) 20 MEQ tablet Take 1 tablet (20 mEq total) by mouth daily. 05/02/17  Yes Herminio Commons, MD  rOPINIRole (REQUIP) 4 MG tablet Take 4 mg by mouth at bedtime.   Yes [provider]  spironolactone (ALDACTONE) 100 MG tablet Take 100 mg by mouth daily.   Yes [provider]  zolpidem (AMBIEN CR) 12.5 MG CR tablet Take 12.5 mg by mouth at bedtime. 05/16/17  Yes [provider]    Scheduled Meds: .  stroke: mapping our early stages of recovery book   Does not apply Once  . aspirin  324 mg Oral Once  . aspirin  300 mg Rectal Daily   Or  . aspirin  325 mg Oral Daily  . clopidogrel  75 mg Oral Daily  . enoxaparin (LOVENOX) injection  40 mg Subcutaneous Q24H  . gabapentin  600 mg Oral QID  . pantoprazole  40 mg Oral Daily  . rOPINIRole  4 mg Oral QHS   Continuous Infusions: PRN Meds:.acetaminophen **OR** acetaminophen (TYLENOL) oral liquid 160 mg/5 mL **OR** acetaminophen, albuterol, labetalol, LORazepam, methocarbamol, morphine injection, senna-docusate, zolpidem     Results for orders placed or performed during the hospital encounter of 05/16/17 (from the past 48 hour(s))  CBC with Differential/Platelet     Status: Abnormal   Collection Time: 05/16/17  8:50 PM  Result Value Ref Range   WBC 12.9 (H) 4.0 - 10.5 K/uL   RBC 4.51 3.87 - 5.11 MIL/uL   Hemoglobin 13.2 12.0 - 15.0 g/dL   HCT 41.7 36.0 - 46.0 %   MCV 92.5 78.0 - 100.0 fL   MCH 29.3 26.0 - 34.0 pg   MCHC 31.7 30.0 - 36.0 g/dL   RDW 14.9 11.5 - 15.5 %   Platelets 281 150 - 400 K/uL   Neutrophils Relative % 47 %   Neutro Abs 6.1 1.7 - 7.7 K/uL   Lymphocytes Relative 45 %   Lymphs Abs 5.8 (H) 0.7 - 4.0 K/uL   Monocytes Relative 6 %   Monocytes  Absolute 0.7 0.1 - 1.0 K/uL   Eosinophils Relative 2 %   Eosinophils Absolute 0.2 0.0 - 0.7 K/uL   Basophils Relative 0 %   Basophils Absolute 0.0 0.0 - 0.1 K/uL  Comprehensive metabolic panel     Status: Abnormal   Collection Time: 05/16/17  8:50 PM  Result Value Ref Range   Sodium 138 135 - 145 mmol/L   Potassium 4.0 3.5 - 5.1 mmol/L   Chloride 105 101 - 111 mmol/L   CO2 25 22 - 32 mmol/L   Glucose, Bld 150 (H) 65 - 99 mg/dL   BUN 25 (H) 6 - 20 mg/dL   Creatinine, Ser 1.04 (H) 0.44 - 1.00 mg/dL   Calcium 9.1 8.9 - 10.3 mg/dL   Total Protein 7.0 6.5 - 8.1 g/dL   Albumin 3.7 3.5 - 5.0 g/dL   AST 18 15 - 41 U/L   ALT 14 14 - 54 U/L   Alkaline Phosphatase 81 38 - 126 U/L   Total Bilirubin 0.2 (L) 0.3 - 1.2 mg/dL   GFR calc non Af Amer 59 (L) >60 mL/min   GFR calc Af Amer >60 >60 mL/min    Comment: (NOTE) The  eGFR has been calculated using the CKD EPI equation. This calculation has not been validated in all clinical situations. eGFR's persistently <60 mL/min signify possible Chronic Kidney Disease.    Anion gap 8 5 - 15  Troponin I     Status: None   Collection Time: 05/16/17  8:50 PM  Result Value Ref Range   Troponin I <0.03 <0.03 ng/mL  Brain natriuretic peptide     Status: None   Collection Time: 05/16/17  8:50 PM  Result Value Ref Range   B Natriuretic Peptide 31.0 0.0 - 100.0 pg/mL  D-dimer, quantitative (not at [facility])     Status: None   Collection Time: 05/16/17  8:50 PM  Result Value Ref Range   D-Dimer, Quant 0.32 0.00 - 0.50 ug/mL-FEU    Comment: (NOTE) At the manufacturer cut-off of 0.50 ug/mL FEU, this assay has been documented to exclude PE with a sensitivity and negative predictive value of 97 to 99%.  At this time, this assay has not been approved by the FDA to exclude DVT/VTE. Results should be correlated with clinical presentation.   Lipase, blood     Status: None   Collection Time: 05/16/17  8:50 PM  Result Value Ref Range   Lipase 26 11 - 51 U/L    Ethanol     Status: None   Collection Time: 05/16/17  8:50 PM  Result Value Ref Range   Alcohol, Ethyl (B) <10 <10 mg/dL    Comment:        LOWEST DETECTABLE LIMIT FOR SERUM ALCOHOL IS 10 mg/dL FOR MEDICAL PURPOSES ONLY   Protime-INR     Status: None   Collection Time: 05/16/17  8:50 PM  Result Value Ref Range   Prothrombin Time 13.2 11.4 - 15.2 seconds   INR 1.01   APTT     Status: None   Collection Time: 05/16/17  8:50 PM  Result Value Ref Range   aPTT 28 24 - 36 seconds  CBG monitoring, ED     Status: Abnormal   Collection Time: 05/16/17  9:02 PM  Result Value Ref Range   Glucose-Capillary 145 (H) 65 - 99 mg/dL   Comment 1 Notify RN    Comment 2 Document in Chart   I-stat troponin, ED     Status: None   Collection Time: 05/16/17  9:10 PM  Result Value Ref Range   Troponin i, poc 0.00 0.00 - 0.08 ng/mL   Comment 3            Comment: Due to the release kinetics of cTnI, a negative result within the first hours of the onset of symptoms does not rule out myocardial infarction with certainty. If myocardial infarction is still suspected, repeat the test at appropriate intervals.   I-Stat Chem 8, ED     Status: Abnormal   Collection Time: 05/16/17  9:11 PM  Result Value Ref Range   Sodium 141 135 - 145 mmol/L   Potassium 4.1 3.5 - 5.1 mmol/L   Chloride 101 101 - 111 mmol/L   BUN 23 (H) 6 - 20 mg/dL   Creatinine, Ser 1.00 0.44 - 1.00 mg/dL   Glucose, Bld 149 (H) 65 - 99 mg/dL   Calcium, Ion 1.18 1.15 - 1.40 mmol/L   TCO2 27 22 - 32 mmol/L   Hemoglobin 13.9 12.0 - 15.0 g/dL   HCT 41.0 36.0 - 46.0 %  I-Stat beta hCG blood, ED     Status: Abnormal   Collection Time:  05/16/17  9:13 PM  Result Value Ref Range   I-stat hCG, quantitative 14.0 (H) <5 mIU/mL   Comment 3            Comment:   GEST. AGE      CONC.  (mIU/mL)   <=1 WEEK        5 - 50     2 WEEKS       50 - 500     3 WEEKS       100 - 10,000     4 WEEKS     1,000 - 30,000        FEMALE AND NON-PREGNANT  FEMALE:     LESS THAN 5 mIU/mL   Urinalysis, Routine w reflex microscopic     Status: Abnormal   Collection Time: 05/16/17 11:08 PM  Result Value Ref Range   Color, Urine STRAW (A) YELLOW   APPearance CLEAR CLEAR   Specific Gravity, Urine 1.024 1.005 - 1.030   pH 7.0 5.0 - 8.0   Glucose, UA NEGATIVE NEGATIVE mg/dL   Hgb urine dipstick SMALL (A) NEGATIVE   Bilirubin Urine NEGATIVE NEGATIVE   Ketones, ur NEGATIVE NEGATIVE mg/dL   Protein, ur NEGATIVE NEGATIVE mg/dL   Nitrite NEGATIVE NEGATIVE   Leukocytes, UA NEGATIVE NEGATIVE   RBC / HPF 0-5 0 - 5 RBC/hpf   WBC, UA 0-5 0 - 5 WBC/hpf   Bacteria, UA NONE SEEN NONE SEEN   Squamous Epithelial / LPF 0-5 (A) NONE SEEN  Urine rapid drug screen (hosp performed)     Status: None   Collection Time: 05/16/17 11:08 PM  Result Value Ref Range   Opiates NONE DETECTED NONE DETECTED   Cocaine NONE DETECTED NONE DETECTED   Benzodiazepines NONE DETECTED NONE DETECTED   Amphetamines NONE DETECTED NONE DETECTED   Tetrahydrocannabinol NONE DETECTED NONE DETECTED   Barbiturates NONE DETECTED NONE DETECTED    Comment:        DRUG SCREEN FOR MEDICAL PURPOSES ONLY.  IF CONFIRMATION IS NEEDED FOR ANY PURPOSE, NOTIFY LAB WITHIN 5 DAYS.        LOWEST DETECTABLE LIMITS FOR URINE DRUG SCREEN Drug Class       Cutoff (ng/mL) Amphetamine      1000 Barbiturate      200 Benzodiazepine   161 Tricyclics       096 Opiates          300 Cocaine          300 THC              50   Hemoglobin A1c     Status: Abnormal   Collection Time: 05/17/17  2:07 AM  Result Value Ref Range   Hgb A1c MFr Bld 5.9 (H) 4.8 - 5.6 %    Comment: (NOTE) Pre diabetes:          5.7%-6.4% Diabetes:              >6.4% Glycemic control for   <7.0% adults with diabetes    Mean Plasma Glucose 122.63 mg/dL    Comment: Performed at [facility] [address] [place], [place] [address]  Lipid panel     Status: Abnormal   Collection Time: 05/17/17  2:07 AM  Result Value  Ref Range   Cholesterol 123 0 - 200 mg/dL   Triglycerides 98 <150 mg/dL   HDL 34 (L) >40 mg/dL   Total CHOL/HDL Ratio 3.6 RATIO   VLDL 20 0 -  40 mg/dL   LDL Cholesterol 69 0 - 99 mg/dL    Comment:        Total Cholesterol/HDL:CHD Risk Coronary Heart Disease Risk Table                     Men   Women  1/2 Average Risk   3.4   3.3  Average Risk       5.0   4.4  2 X Average Risk   9.6   7.1  3 X Average Risk  23.4   11.0        Use the calculated Patient Ratio above and the CHD Risk Table to determine the patient's CHD Risk.        ATP III CLASSIFICATION (LDL):  <100     mg/dL   Optimal  100-129  mg/dL   Near or Above                    Optimal  130-159  mg/dL   Borderline  160-189  mg/dL   High  >190     mg/dL   Very High   Troponin I (q 6hr x 3)     Status: None   Collection Time: 05/17/17  2:07 AM  Result Value Ref Range   Troponin I <0.03 <0.03 ng/mL  MRSA PCR Screening     Status: None   Collection Time: 05/18/17 10:51 AM  Result Value Ref Range   MRSA by PCR NEGATIVE NEGATIVE    Comment:        The GeneXpert MRSA Assay (FDA approved for NASAL specimens only), is one component of a comprehensive MRSA colonization surveillance program. It is not intended to diagnose MRSA infection nor to guide or monitor treatment for MRSA infections.     Studies/Results:  MRI 06-2016 FINDINGS: Study performed under anesthesia.  Brain: Stable cerebral volume since 2015 which appears normal for age. No restricted diffusion to suggest acute infarction. No midline shift, mass effect, evidence of mass lesion, ventriculomegaly, extra-axial collection or acute intracranial hemorrhage. Cervicomedullary junction and pituitary are within normal limits.  Chronic scattered mostly subcortical cerebral white matter T2 and FLAIR hyperintensity has not significantly changed since 2015 and is in a nonspecific configuration. The periventricular white matter remains spared. No cortical  encephalomalacia or chronic cerebral blood products are identified. Deep gray matter nuclei appear stable and normal. There is mild scattered patchy T2 hyperintensity in the pons which has not significantly changed since 2015. The cerebellum appears normal.  Vascular: Major intracranial vascular flow voids are stable and appear normal; dominant distal right vertebral artery.  Skull and upper cervical spine: Negative. Stable bone marrow signal which is within normal limits.  Sinuses/Orbits: The bilateral orbits soft tissues including the optic nerves appear within normal limits.  Visualized paranasal sinuses and mastoids are stable and well pneumatized.  Other: Visible internal auditory structures appear normal. Negative scalp soft tissues.  IMPRESSION: No acute intracranial abnormality and stable MRI appearance of the brain since 2015. Chronic subcortical cerebral white matter in patchy pontine signal abnormality has not significantly changed since October 2015. The configuration is nonspecific but not typical for multiple sclerosis.     CTA HEAD NECK IMPRESSION: 1. No emergent intracranial large vessel occlusion. 2. 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 This is probably a chronic finding and would not account for the reported left-sided weakness. The intracranial posterior circulation is entirely patent.  3. Mild bilateral carotid bifurcation atherosclerotic calcification without hemodynamically significant stenosis. Aortic Atherosclerosis      The brain MRI scan from January of this year is reviewed in person. FLAIR imaging indicates that there are about 10 deep white matter plaque-like lesion couple that are perpendicular to the ventricle. No lesion of the corpus callosum is observed. There are 2 deep lesions involving the tegmentum of the pontine region on the left side consistent with  ischemic changes. No acute lesions are noted. There is no hemorrhage seen.         BRAIN MRI 05-18-17 FINDINGS: The examination had to be discontinued prior to completion due to claustrophobia. Intermittently motion degraded diffusion-weighted imaging, sagittal T1 weighted imaging, axial T2 and FLAIR imaging was obtained.  Brain: Stable cerebral volume. No midline shift, mass effect, or evidence of intracranial mass lesion. No ventriculomegaly. No evidence of acute intracranial hemorrhage or extra-axial collection.  No restricted diffusion or evidence of acute infarction.  Patchy and scattered chronic bilateral cerebral white matter T2 and FLAIR hyperintensity appears similar to the January MRI. Suggestion of new T2 hyperintensity in the left caudate nucleus near the left frontal horn since January with facilitated diffusion (series 14, image 14). Stable patchy T2 hyperintensity in the left pons. No cortical encephalomalacia identified.  Vascular: Major intracranial vascular flow voids are grossly stable since the January MRI.  Skull and upper cervical spine: Not well evaluated.  Sinuses/Orbits: Appear stable.  Other: Stable trace mastoid fluid. Small volume retained secretions in the nasopharynx today. Stable scalp soft tissues.  IMPRESSION: 1. Intermittently motion degraded study which had to be discontinued prior to completion despite repeated imaging attempts. 2.  No acute intracranial abnormality identified. 3. Questionable chronic lacunar infarct in the left caudate which is new since January. Otherwise stable nonspecific cerebral white matter and pontine signal changes.      The brain MRI scan is reviewed in person. There is again this significant deep white matter increased signal involving the FLAIR imaging. There is evidence of a single new lesion compared to the previous scan involving the right frontal region. This could be near the caudate  nuclei or adjacent white matter. Nothing acute is seen on DWI. Contrast was not given because the patient was claustrophobic and the scan had to be discontinued.         Mahdi Frye A. Merlene [name], M.D.  Diplomate, Tax adviser of Psychiatry and Neurology ( Neurology). 05/18/2017, 6:02 PM

## 2017-05-18 NOTE — Evaluation (Signed)
Occupational Therapy Evaluation Patient Details Name: Katrina [name] MRN: [id number] DOB: 06/22/1960 Today's Date: 05/18/2017    History of Present Illness Katrina [name] is a 56 y.o. female with medical history significant for history of CVA with residual deficits, accelerated hypertension, and COPD, now presenting to the emergency department with numerous complaints, including intermittent chest pain, headache, and left-sided weakness.  Patient reports recurrent, intermittent pain in the left chest since yesterday, lasting approximately 1 minute at a time without radiation, and without alleviating or exacerbating factors identified.  She also reports some left-sided weakness, but has difficulty determining the time of onset, noting that some of it may be chronic and it seems to wax and wane.  Patient was suffering from a headache that developed gradually several hours ago, eased-off spontaneously, now returns. The headache is moderate in intensity, dull in character, localized to behind the left eye, similar to her chronic headaches, and without alleviating or exacerbating factors.  No fevers or chills and no significant dyspnea or cough.   Clinical Impression   Pt up in chair on OT arrival, marginally agreeable to OT evaluation. Pt reports of home situation and therapy history are inconsistent at best. Pt demonstrates LUE weakness, reports worse than baseline weakness. On assessment pt able to lift LUE to 50% range, on observation is able to reach up on bedside table for drink/phone/etc. Coordination appears intact on observation as pt is using LUE for opening drink bottle and removing/replacing lid as well as using BUE for fixing coffee-opening lid, opening and pouring sugar. Pt would benefit from skilled OT services on discharge to improve strength and functional use of LUE. Recommend OPOT on discharge, pt is only interested in Neurorehab facility in [place] at this time. No further acute OT  services at this time, all further needs can be met on discharge to OP therapy services.     Follow Up Recommendations  Outpatient OT    Equipment Recommendations  None recommended by OT       Precautions / Restrictions Precautions Precautions: Fall Restrictions Weight Bearing Restrictions: No      Mobility Bed Mobility               General bed mobility comments: Pt seated in chair on OT arrival  Transfers Overall transfer level: Needs assistance Equipment used: None Transfers: Sit to/from Stand Sit to Stand: Supervision                  ADL either performed or assessed with clinical judgement   ADL Overall ADL's : Needs assistance/impaired                                       General ADL Comments: Pt is completing seated ADLs at supervision level, supervision to min guard for standing tasks due to balance deficits and LLE weakness     Vision Baseline Vision/History: (hx of left homonymous hemianopsia) Patient Visual Report: Blurring of vision Vision Assessment?: Yes Eye Alignment: Within Functional Limits Ocular Range of Motion: Within Functional Limits Alignment/Gaze Preference: Within Defined Limits Tracking/Visual Pursuits: Able to track stimulus in all quads without difficulty Saccades: Within functional limits Convergence: Within functional limits Visual Fields: Left homonymous hemianopsia            Pertinent Vitals/Pain Pain Assessment: No/denies pain     Hand Dominance Right   Extremity/Trunk Assessment Upper Extremity Assessment Upper Extremity Assessment:   LUE deficits/detail LUE Deficits / Details: shoulder strength 3-/5, elbow 3+/5, wrist 3+/5   Lower Extremity Assessment Lower Extremity Assessment: Defer to PT evaluation   Cervical / Trunk Assessment Cervical / Trunk Assessment: Normal   Communication Communication Communication: No difficulties   Cognition Arousal/Alertness: Awake/alert Behavior During  Therapy: WFL for tasks assessed/performed Overall Cognitive Status: Within Functional Limits for tasks assessed                                                Home Living Family/patient expects to be discharged to:: Private residence Living Arrangements: Children Available Help at Discharge: Family Type of Home: House Home Access: Level entry     Home Layout: Two level;Laundry or work area in Building surveyor of Steps: 12-14(pt stays in basement) Alternate Level Stairs-Rails: Right Bathroom Shower/Tub: Tub/shower unit(on main level)   Bathroom Toilet: Standard     Home Equipment: Cane - single point;Walker - 2 wheels   Additional Comments: Patient states she lives in the basement and go upstairs to the main unit to use the bathroom  Lives With: Family    Prior Functioning/Environment Level of Independence: Independent with assistive device(s)                 OT Problem List: Decreased strength;Decreased activity tolerance;Impaired balance (sitting and/or standing);Decreased coordination;Impaired UE functional use       AM-PAC PT "6 Clicks" Daily Activity     Outcome Measure Help from another person eating meals?: None Help from another person taking care of personal grooming?: None Help from another person toileting, which includes using toliet, bedpan, or urinal?: None Help from another person bathing (including washing, rinsing, drying)?: A Little Help from another person to put on and taking off regular upper body clothing?: None Help from another person to put on and taking off regular lower body clothing?: None 6 Click Score: 23   End of Session    Activity Tolerance: Patient tolerated treatment well Patient left: in chair;with call bell/phone within reach  OT Visit Diagnosis: Muscle weakness (generalized) (M62.81);Hemiplegia and hemiparesis Hemiplegia - Right/Left: Left Hemiplegia - dominant/non-dominant:  Non-Dominant Hemiplegia - caused by: Cerebral infarction                Time: 9767-3419 OT Time Calculation (min): 19 min Charges:  OT General Charges $OT Visit: 1 Visit OT Evaluation $OT Eval Low Complexity: 1 Low G-Codes: OT G-codes **NOT FOR INPATIENT CLASS** Functional Assessment Tool Used: AM-PAC 6 Clicks Daily Activity Functional Limitation: Self care Self Care Current Status (F7902): At least 1 percent but less than 20 percent impaired, limited or restricted Self Care Goal Status (I0973): At least 1 percent but less than 20 percent impaired, limited or restricted Self Care Discharge Status [phone]): At least 1 percent but less than 20 percent impaired, limited or restricted    Guadelupe Sabin, OTR/L  [phone] 05/18/2017, 8:11 AM

## 2017-05-19 ENCOUNTER — Inpatient Hospital Stay (HOSPITAL_COMMUNITY): Payer: Medicare Other

## 2017-05-19 ENCOUNTER — Observation Stay (HOSPITAL_COMMUNITY): Payer: Medicare Other

## 2017-05-19 DIAGNOSIS — D6859 Other primary thrombophilia: Secondary | ICD-10-CM | POA: Diagnosis present

## 2017-05-19 DIAGNOSIS — I6930 Unspecified sequelae of cerebral infarction: Secondary | ICD-10-CM | POA: Diagnosis not present

## 2017-05-19 DIAGNOSIS — E1140 Type 2 diabetes mellitus with diabetic neuropathy, unspecified: Secondary | ICD-10-CM | POA: Diagnosis present

## 2017-05-19 DIAGNOSIS — Z66 Do not resuscitate: Secondary | ICD-10-CM | POA: Diagnosis present

## 2017-05-19 DIAGNOSIS — F17210 Nicotine dependence, cigarettes, uncomplicated: Secondary | ICD-10-CM | POA: Diagnosis present

## 2017-05-19 DIAGNOSIS — E538 Deficiency of other specified B group vitamins: Secondary | ICD-10-CM | POA: Diagnosis present

## 2017-05-19 DIAGNOSIS — I639 Cerebral infarction, unspecified: Secondary | ICD-10-CM | POA: Diagnosis not present

## 2017-05-19 DIAGNOSIS — R29709 NIHSS score 9: Secondary | ICD-10-CM | POA: Diagnosis present

## 2017-05-19 DIAGNOSIS — I6523 Occlusion and stenosis of bilateral carotid arteries: Secondary | ICD-10-CM | POA: Diagnosis present

## 2017-05-19 DIAGNOSIS — H510 Palsy (spasm) of conjugate gaze: Secondary | ICD-10-CM | POA: Diagnosis present

## 2017-05-19 DIAGNOSIS — R29898 Other symptoms and signs involving the musculoskeletal system: Secondary | ICD-10-CM | POA: Diagnosis present

## 2017-05-19 DIAGNOSIS — F459 Somatoform disorder, unspecified: Secondary | ICD-10-CM | POA: Diagnosis present

## 2017-05-19 DIAGNOSIS — R531 Weakness: Secondary | ICD-10-CM | POA: Diagnosis present

## 2017-05-19 DIAGNOSIS — I160 Hypertensive urgency: Secondary | ICD-10-CM | POA: Diagnosis present

## 2017-05-19 DIAGNOSIS — G43109 Migraine with aura, not intractable, without status migrainosus: Secondary | ICD-10-CM | POA: Diagnosis present

## 2017-05-19 DIAGNOSIS — R0789 Other chest pain: Secondary | ICD-10-CM | POA: Diagnosis present

## 2017-05-19 DIAGNOSIS — E1165 Type 2 diabetes mellitus with hyperglycemia: Secondary | ICD-10-CM | POA: Diagnosis present

## 2017-05-19 DIAGNOSIS — I69354 Hemiplegia and hemiparesis following cerebral infarction affecting left non-dominant side: Secondary | ICD-10-CM | POA: Diagnosis not present

## 2017-05-19 DIAGNOSIS — R402413 Glasgow coma scale score 13-15, at hospital admission: Secondary | ICD-10-CM | POA: Diagnosis present

## 2017-05-19 DIAGNOSIS — G35 Multiple sclerosis: Secondary | ICD-10-CM | POA: Diagnosis present

## 2017-05-19 DIAGNOSIS — J449 Chronic obstructive pulmonary disease, unspecified: Secondary | ICD-10-CM | POA: Diagnosis present

## 2017-05-19 DIAGNOSIS — G2581 Restless legs syndrome: Secondary | ICD-10-CM | POA: Diagnosis present

## 2017-05-19 LAB — CSF CELL COUNT WITH DIFFERENTIAL
RBC Count, CSF: 1 /mm3 — ABNORMAL HIGH
TUBE #: 4

## 2017-05-19 LAB — PROTEIN AND GLUCOSE, CSF
GLUCOSE CSF: 83 mg/dL — AB (ref 40–70)
TOTAL PROTEIN, CSF: 37 mg/dL (ref 15–45)

## 2017-05-19 LAB — HOMOCYSTEINE: Homocysteine: 18.6 umol/L — ABNORMAL HIGH (ref 0.0–15.0)

## 2017-05-19 LAB — RPR: RPR Ser Ql: NONREACTIVE

## 2017-05-19 MED ORDER — CYANOCOBALAMIN 1000 MCG/ML IJ SOLN
1000.0000 ug | Freq: Once | INTRAMUSCULAR | Status: AC
  Administered 2017-05-20: 1000 ug via INTRAMUSCULAR
  Filled 2017-05-19: qty 1

## 2017-05-19 MED ORDER — HYDRALAZINE HCL 25 MG PO TABS
50.0000 mg | ORAL_TABLET | Freq: Three times a day (TID) | ORAL | Status: DC
  Administered 2017-05-20: 50 mg via ORAL
  Filled 2017-05-19 (×2): qty 2

## 2017-05-19 MED ORDER — CYANOCOBALAMIN 1000 MCG/ML IJ SOLN
1000.0000 ug | Freq: Once | INTRAMUSCULAR | Status: DC
  Filled 2017-05-19: qty 1

## 2017-05-19 MED ORDER — BUTALBITAL-APAP-CAFFEINE 50-325-40 MG PO TABS
2.0000 | ORAL_TABLET | ORAL | Status: DC | PRN
Start: 2017-05-19 — End: 2017-05-24
  Administered 2017-05-20 (×2): 2 via ORAL
  Filled 2017-05-19 (×2): qty 2

## 2017-05-19 MED ORDER — SPIRONOLACTONE 25 MG PO TABS
100.0000 mg | ORAL_TABLET | Freq: Every day | ORAL | Status: DC
  Filled 2017-05-19: qty 4

## 2017-05-19 MED ORDER — ONDANSETRON HCL 4 MG/2ML IJ SOLN
4.0000 mg | Freq: Four times a day (QID) | INTRAMUSCULAR | Status: DC | PRN
  Administered 2017-05-19 – 2017-05-21 (×3): 4 mg via INTRAVENOUS
  Filled 2017-05-19 (×3): qty 2

## 2017-05-19 MED ORDER — AMLODIPINE BESYLATE 5 MG PO TABS
10.0000 mg | ORAL_TABLET | Freq: Every day | ORAL | Status: DC
  Administered 2017-05-20: 10 mg via ORAL
  Filled 2017-05-19: qty 2

## 2017-05-19 MED ORDER — ATENOLOL 25 MG PO TABS
50.0000 mg | ORAL_TABLET | Freq: Every day | ORAL | Status: DC
  Filled 2017-05-19: qty 2

## 2017-05-19 MED ORDER — CHLORTHALIDONE 25 MG PO TABS
25.0000 mg | ORAL_TABLET | Freq: Two times a day (BID) | ORAL | Status: DC
  Administered 2017-05-20 – 2017-05-22 (×3): 25 mg via ORAL
  Filled 2017-05-19 (×12): qty 1

## 2017-05-19 MED ORDER — HYDROMORPHONE HCL 1 MG/ML IJ SOLN
0.5000 mg | INTRAMUSCULAR | Status: DC | PRN
  Administered 2017-05-19 – 2017-05-22 (×9): 0.5 mg via INTRAVENOUS
  Filled 2017-05-19 (×9): qty 1

## 2017-05-19 MED ORDER — LOSARTAN POTASSIUM 50 MG PO TABS
50.0000 mg | ORAL_TABLET | Freq: Two times a day (BID) | ORAL | Status: DC
  Administered 2017-05-20 – 2017-05-23 (×4): 50 mg via ORAL
  Filled 2017-05-19 (×5): qty 1

## 2017-05-19 NOTE — Progress Notes (Signed)
PROGRESS NOTE    Katrina [name]  NFA:[id number] DOB: 11/19/1960 DOA: 05/16/2017 PCP: Berkley Harvey, NP   Brief Narrative:  56 year old female with a history of gnawing hypertension, COPD, depression, and stroke with left hemiparesis 2016 presented with 1 day history of left-sided chest discomfort underneath her left breast radiating to the shoulder, worsening left-sided weakness on the evening of May 16, 2017, and headache.  The patient states that she had left-sided chest discomfort that has been on and off since afternoon of May 16, 2017 with some shortness of breath.  The patient states that she chronically has shortness of breath, but denied any nausea, vomiting, presyncope.  She denies any fevers, chills, vomiting, diarrhea, abdominal pain, dysuria, hematuria.  While walking to the living room around dinnertime, the patient noted worsening of her left upper and left lower extremity weakness.  She states there is worse than her usual baseline weakness.  She also had associated headache behind her left eye without any visual disturbance, dysarthria, dysesthesia.  She continues Symbicort and 1/2 packs/day.  She endorses compliance with all her medications.  Because of complaints, she presented for further evaluation.     Assessment & Plan:   Principal Problem:   CVA (cerebral vascular accident) [facility]) Active Problems:   Moderate COPD (chronic obstructive pulmonary disease) (HCC)   Uncontrolled hypertension   Type 2 diabetes, uncontrolled, with neuropathy (HCC)   Left-sided weakness   Atypical chest pain   Stroke (cerebrum) (HCC)   History of CVA with residual deficit   Left hemiparesis -While the patient may have some baseline left hemiparesis, components of her physical exam are Nonphysiologic -suspect she has some functional deficits -Neurology Consulted, MRI performed yesterday morning -Looking at order set appears as though patient will undergo lumbar puncture this  evening -Neurology concern for possible multiple sclerosis and started patient on high-dose IV steroids -PT recommending home health care/OT recommending outpatient OT -CTA H&N--no intracranial large vessel occlusion; 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 Systolic function was   vigorous. The estimated ejection fraction was in the range of 65%   to 70%. Wall motion was normal; there were no regional wall   motion abnormalities. Doppler parameters are consistent with   abnormal left ventricular relaxation (grade 1 diastolic   dysfunction). Doppler parameters are consistent with high   ventricular filling pressure. Mild concentric and mild to   moderate focal basal septal hypertrophy. -LDL--69 -Awaiting further recommendations following lumbar puncture and second dose of high-dose IV steroids  Atypical chest pain -Troponins negative x3 -Echocardiogram: Left ventricle: The cavity size was normal. Systolic function was   vigorous. The estimated ejection fraction was in the range of 65%   to 70%. Wall motion was normal; there were no regional wall   motion abnormalities. Doppler parameters are consistent with   abnormal left ventricular relaxation (grade 1 diastolic   dysfunction). Doppler parameters are consistent with high   ventricular filling pressure. Mild concentric and mild to   moderate focal basal septal hypertrophy. -LDL 69 -Coronary angiography on 01/31/15 showed no angiographic evidence of coronary disease with normal left ventricle systolic function  -Continue telemetry  Malignant hypertension -Patient presented with blood pressure 222/101 -allowing permissive hypertension in first 24 hours -July 2018 renal angiogram--negative for renal artery stenosis -Patient reports she has  chronically elevated blood pressure at home with systolics at  least in the 200s  COPD/tobacco abuse -Stable on room air -Tobacco cessation discussed nicotine patch ordered  Diabetes mellitus type 2 -December 04, 2016 hemoglobin A1c 5.8 -Hemoglobin A1c of 5.9  Chronic daily headache -We will need outpatient neurology follow-up preferably with a headache specialist this was discussed at length with patient this afternoon  GERD -continue PPI     DVT prophylaxis: lovenox Code Status: Full code Family Communication: No family bedside he was discharged Disposition Plan: Patient to undergo lumbar puncture this evening and will get second dose of high-dose IV steroids, will need to further discuss with neurology continued workup for patient and likely discharge planning   Consultants:   OT  PT  Procedures:   EEG on 05/18/17  Antimicrobials:   None   Subjective: Patient seen and examined.  She reports she still having continued headache on the left forehead and around her orbit.  She reports she has had improvement in pain in the past with Dilaudid but has not had any improvement with morphine.  She still has chronically elevated blood pressure.  She states she is told that she will undergo a lumbar puncture this evening and that she was started on IV high-dose steroids last night.  She endorses she has some shakiness and jitteriness since the steroids were initiated.  She did report she had one episode of feeling faint when ambulating to the restroom this morning.  Objective: Vitals:   05/18/17 2131 05/19/17 0131 05/19/17 0530 05/19/17 0836  BP: (!) 175/95 (!) 172/92 (!) 170/90 (!) 187/121  Pulse: 93 98 90 (!) 110  Resp: 20 18 20 18   Temp: 98.8 F (37.1 C) 98.3 F (36.8 C)  97.7 F (36.5 C)  TempSrc: Oral Oral Oral Oral  SpO2: 97% 96% 97% 97%  Weight:      Height:        Intake/Output Summary (Last 24 hours) at 05/19/2017 1022 Last data filed at 05/19/2017  0300 Gross per 24 hour  Intake 538 ml  Output -  Net 538 ml   Filed Weights   05/16/17 2032 05/17/17 0122 05/17/17 0500  Weight: 97.5 kg (215 lb) 98.2 kg (216 lb 7.9 oz) 98.2 kg (216 lb 7.9 oz)    Examination:  General exam: No acute distress Respiratory system: Clear to auscultation. Respiratory effort normal. Cardiovascular system: S1 & S2 heard, RRR. No JVD, murmurs, rubs, gallops or clicks. No pedal edema. Gastrointestinal system: Abdomen is obese, nondistended, soft and nontender. No organomegaly or masses felt. Normal bowel sounds heard. Central nervous system: Alert and oriented.  Weakness at ankle on left side cranial nerves II through XII are grossly intact Extremities: Weakness on ankle on left side, able to move upper and lower extremities bilaterally Skin: No rashes, lesions or ulcers Psychiatry: Appropriate mood and affect    Data Reviewed: I have personally reviewed following labs and imaging studies  CBC: Recent Labs  Lab 05/16/17 2050 05/16/17 2111  WBC 12.9*  --   NEUTROABS 6.1  --   HGB 13.2 13.9  HCT 41.7 41.0  MCV 92.5  --   PLT 281  --    Basic Metabolic Panel: Recent Labs  Lab 05/16/17 2050 05/16/17 2111  NA 138 141  K 4.0 4.1  CL 105 101  CO2 25  --   GLUCOSE 150* 149*  BUN 25* 23*  CREATININE 1.04* 1.00  CALCIUM 9.1  --    GFR: Estimated Creatinine Clearance: 74.3 mL/min (by C-G formula based on SCr  of 1 mg/dL). Liver Function Tests: Recent Labs  Lab 05/16/17 2050  AST 18  ALT 14  ALKPHOS 81  BILITOT 0.2*  PROT 7.0  ALBUMIN 3.7   Recent Labs  Lab 05/16/17 2050  LIPASE 26   No results for input(s): AMMONIA in the last 168 hours. Coagulation Profile: Recent Labs  Lab 05/16/17 2050  INR 1.01   Cardiac Enzymes: Recent Labs  Lab 05/16/17 2050 05/17/17 0207  TROPONINI <0.03 <0.03   BNP (last 3 results) No results for input(s): PROBNP in the last 8760 hours. HbA1C: Recent Labs    05/17/17 0207  HGBA1C 5.9*    CBG: Recent Labs  Lab 05/16/17 2102  GLUCAP 145*   Lipid Profile: Recent Labs    05/17/17 0207  CHOL 123  HDL 34*  LDLCALC 69  TRIG 98  CHOLHDL 3.6   Thyroid Function Tests: No results for input(s): TSH, T4TOTAL, FREET4, T3FREE, THYROIDAB in the last 72 hours. Anemia Panel: Recent Labs    05/18/17 1800  VITAMINB12 223   Sepsis Labs: No results for input(s): PROCALCITON, LATICACIDVEN in the last 168 hours.  Recent Results (from the past 240 hour(s))  MRSA PCR Screening     Status: None   Collection Time: 05/18/17 10:51 AM  Result Value Ref Range Status   MRSA by PCR NEGATIVE NEGATIVE Final    Comment:        The GeneXpert MRSA Assay (FDA approved for NASAL specimens only), is one component of a comprehensive MRSA colonization surveillance program. It is not intended to diagnose MRSA infection nor to guide or monitor treatment for MRSA infections.          Radiology Studies: Mr Brain 51 Contrast  Result Date: 05/18/2017 CLINICAL DATA:  56 year old female with altered mental status for 2 days. EXAM: MRI HEAD WITHOUT CONTRAST TECHNIQUE: Multiplanar, multiecho pulse sequences of the brain and surrounding structures were obtained without intravenous contrast. COMPARISON:  Brain MRI 06/29/2016 and earlier. CTA head and neck 05/16/2017. FINDINGS: The examination had to be discontinued prior to completion due to claustrophobia. Intermittently motion degraded diffusion-weighted imaging, sagittal T1 weighted imaging, axial T2 and FLAIR imaging was obtained. Brain: Stable cerebral volume. No midline shift, mass effect, or evidence of intracranial mass lesion. No ventriculomegaly. No evidence of acute intracranial hemorrhage or extra-axial collection. No restricted diffusion or evidence of acute infarction. Patchy and scattered chronic bilateral cerebral white matter T2 and FLAIR hyperintensity appears similar to the January MRI. Suggestion of new T2 hyperintensity in the  left caudate nucleus near the left frontal horn since January with facilitated diffusion (series 14, image 14). Stable patchy T2 hyperintensity in the left pons. No cortical encephalomalacia identified. Vascular: Major intracranial vascular flow voids are grossly stable since the January MRI. Skull and upper cervical spine: Not well evaluated. Sinuses/Orbits: Appear stable. Other: Stable trace mastoid fluid. Small volume retained secretions in the nasopharynx today. Stable scalp soft tissues. IMPRESSION: 1. Intermittently motion degraded study which had to be discontinued prior to completion despite repeated imaging attempts. 2.  No acute intracranial abnormality identified. 3. Questionable chronic lacunar infarct in the left caudate which is new since January. Otherwise stable nonspecific cerebral white matter and pontine signal changes. Electronically Signed   By: Genevie Ann M.D.   On: 05/18/2017 08:59        Scheduled Meds: .  stroke: mapping our early stages of recovery book   Does not apply Once  . aspirin  324 mg Oral Once  .  gabapentin  600 mg Oral QID  . nicotine  21 mg Transdermal Daily  . pantoprazole  40 mg Oral Daily  . rOPINIRole  4 mg Oral QHS   Continuous Infusions: . methylPREDNISolone (SOLU-MEDROL) injection Stopped (05/18/17 2350)     LOS: 0 days    Time spent: 30 minutes    Loretha Stapler, MD [facility] Pager [phone]  If 7PM-7AM, please contact night-coverage [email or web address] Password [email or web address] 05/19/2017, 10:22 AM

## 2017-05-19 NOTE — Procedures (Signed)
[place] A. Merlene Laughter, MD     [email or web address]           HISTORY: This is a 56 year old who presents with recurrent episodes of focal deficits involving the left side.  This symptoms are concerning for possible partial seizures.  MEDICATIONS: Scheduled Meds: .  stroke: mapping our early stages of recovery book   Does not apply Once  . aspirin  324 mg Oral Once  . gabapentin  600 mg Oral QID  . nicotine  21 mg Transdermal Daily  . pantoprazole  40 mg Oral Daily  . rOPINIRole  4 mg Oral QHS   Continuous Infusions: . methylPREDNISolone (SOLU-MEDROL) injection Stopped (05/18/17 2350)   PRN Meds:.acetaminophen **OR** acetaminophen (TYLENOL) oral liquid 160 mg/5 mL **OR** acetaminophen, albuterol, labetalol, LORazepam, methocarbamol, morphine injection, senna-docusate, zolpidem  Prior to Admission medications   Medication Sig Start Date End Date Taking? Authorizing Provider  albuterol (PROVENTIL HFA;VENTOLIN HFA) 108 (90 Base) MCG/ACT inhaler Inhale 2 puffs into the lungs every 6 (six) hours as needed. 06/09/16 06/09/17 Yes [provider]  amLODipine (NORVASC) 10 MG tablet Take 1 tablet (10 mg total) by mouth daily. 06/05/14  Yes Herminio Commons, MD  aspirin-acetaminophen-caffeine (EXCEDRIN MIGRAINE) [id number] MG tablet Take 2 tablets by mouth daily as needed for migraine.    Yes [provider]  atenolol (TENORMIN) 50 MG tablet Take 1 tablet (50 mg total) by mouth daily. 05/02/17  Yes Herminio Commons, MD  celecoxib (CELEBREX) 200 MG capsule Take 200 mg by mouth at bedtime. 05/02/17  Yes [provider]  chlorthalidone (HYGROTON) 25 MG tablet Take 25 mg by mouth 2 (two) times daily.    Yes [provider]  clopidogrel (PLAVIX) 75 MG tablet Take 75 mg by mouth daily.    Yes [provider]  dexlansoprazole (DEXILANT) 60 MG capsule Take 1 tab by mouth every morning. Patient taking differently: Take 60 mg by mouth  every evening.  10/18/16  Yes Esterwood, Amy S, PA-C  furosemide (LASIX) 20 MG tablet Take 1 tablet (20 mg total) by mouth daily. 05/02/17 07/31/17 Yes Herminio Commons, MD  gabapentin (NEURONTIN) 600 MG tablet Take 600 mg by mouth 4 (four) times daily.   Yes [provider]  hydrALAZINE (APRESOLINE) 50 MG tablet Take 1 tablet (50 mg total) by mouth 3 (three) times daily. 05/02/17  Yes Herminio Commons, MD  losartan (COZAAR) 50 MG tablet Take 50 mg by mouth 2 (two) times daily.  06/09/16  Yes [provider]  methocarbamol (ROBAXIN) 500 MG tablet Take 1 tablet by mouth every 6 (six) hours as needed for muscle spasms.  05/02/17  Yes [provider]  potassium chloride SA (K-DUR,KLOR-CON) 20 MEQ tablet Take 1 tablet (20 mEq total) by mouth daily. 05/02/17  Yes Herminio Commons, MD  rOPINIRole (REQUIP) 4 MG tablet Take 4 mg by mouth at bedtime.   Yes [provider]  spironolactone (ALDACTONE) 100 MG tablet Take 100 mg by mouth daily.   Yes [provider]  zolpidem (AMBIEN CR) 12.5 MG CR tablet Take 12.5 mg by mouth at bedtime. 05/16/17  Yes [provider]      ANALYSIS: A 16 channel recording using standard 10 20 measurements is conducted for 21 minutes.  There is a well-formed posterior dominant rhythm of 10.5-11 hertz which attenuates with eye opening.  There is beta activity observed in the frontal areas.  Awake and sleep activities are observed.  Prominent K complexes  and sleep spindles are seen throughout most of the recording.  Photic stimulation and hyperventilation are not conducted.  There is no focal or lateralized slowing.  There is no epileptiform activity is observed.    IMPRESSION: This is a normal recording of the awake and sleep states.      Tayleigh Wetherell A. Merlene Laughter, M.D.  Diplomate, Tax adviser of Psychiatry and Neurology ( Neurology).

## 2017-05-19 NOTE — Progress Notes (Addendum)
[place] A. Merlene Laughter, MD     [email or web address]          Katrina [name] is an 56 y.o. female.   ASSESSMENT/PLAN: 1. Recurrent episodes of left-sided numbness, weakness and tingling of unclear etiology: Depression diagnosis includes complicated migraine headache/my headaches with aura, multiple sclerosis, psychogenic disorders, complications from elevated blood pressure and ischemic stroke. Ischemic stroke is less likely however given the the recurrent nature and negative imaging. Physical examination showed evidence that suggest organic weakness but with evidence of none physiologic/nonanatomic overlay. The MRI findings are worrisome for multiple sclerosis. She is to get 3 doses of Solu-Medrol thousand milligrams daily. She can get the final dose in the afternoon tomorrow and be discharged home. Follow-up should be in our office in one month.  2. Episodic headaches: She has a history of migraine headaches. Fioricet will be given for abortive treatment.  3. Multiple other symptoms included vertiginous symptoms, left facial pain and numbness of the upper extremities possibly due to her baseline underlying condition which we suspect is MS.    4. Vitamin B12 deficiency: This will be replaced.    She reports that she continues to have dizziness and vertiginous symptoms when she walks around. She complains of now having numbness of the right finger tips. She has had numbness on the left side previously. She complains of having headaches involving the left hemicranial going down to the left cervical region. She has a long-standing history of episodic headaches since childhood. She was on Maxalt and Imitrex with these caused her blood pressure to be high even higher than it is therefore these were discontinued. She has tolerated Fioricet in the past. Spinal fluid initial results are reviewed and mostly unrevealing by the oligoclonal bands would not be available for week or  more.   GENERAL: This a pleasant lady who is in no acute distress.  HEENT: Supple neck and no trauma appreciated.  ABDOMEN: soft  EXTREMITIES: No edema   BACK: This is normal.  SKIN: Normal by inspection.    MENTAL STATUS: Alert and oriented. Speech, language and cognition are generally intact. Judgment and insight normal.   CRANIAL NERVES: Pupils are equal, round and reactive to light and accomodation; extra ocular movements are full, there is no significant nystagmus; visual fields are full; she has a more significant weakness of the left lower facial muscles. Interestingly, tongue deviates towards the right - away from the weak side which is different than expected, uvula is somewhat midline; shoulder shrug is weak on the left  MOTOR: Right deltoid 4+/5, right hand grip 5. Right hip flexion and dorsiflexion 5/5. Left deltoid 4+/5, left tricep 4 minus with clear evidence of giveaway weakness, hand grip 5. Left hip flexion 3/5 in dorsiflexion 3.  COORDINATION: No dysmetria or tremors are noted on the right side. No clear dysmetria or tremors on the left side.        Blood pressure (!) 170/90, pulse (!) 107, temperature 98.9 F (37.2 C), temperature source Oral, resp. rate 18, height 5\' 6"  (1.676 m), weight 216 lb 7.9 oz (98.2 kg), SpO2 96 %.  Past Medical History:  Diagnosis Date  . Allergy   . Anemia 1975-1976   . Anginal pain ([place])   . Anxiety    takes Valium daily as needed  . Arthritis    "spine" (12/03/2016)  . Asthma    has inhalers but doesn't use (12/03/2016)  . CAD (coronary artery disease)    Moderate nonobstructive 2012-2013,  [place]; No angiographic evidence of CAD 01/31/15 [facility]  . Chronic bronchitis ([place])    "get it a couple times q yr" (12/03/2016)  . Chronic kidney disease   . Chronic lower back pain    budlging disc   . Claustrophobia   . COPD (chronic obstructive pulmonary disease) ([place])   . Daily headache   . Depression   . Diverticulitis   .  Family history of adverse reaction to anesthesia    2 daughters gets extremely sick   . GERD (gastroesophageal reflux disease)    takes Dexilant daily  . Heart murmur   . History of blood transfusion 1975-1976 "several"   no abnormal reaction noted  . History of colitis   . History of colon polyps    benign  . History of gastric ulcer   . History of hiatal hernia    "small one"  . History of MRSA infection 2017  . Hyperlipidemia    was on meds but has been off over a yr  . Hypertension    takes Amlodipine and Maxzide daily  . Insomnia    takes Ambien nightly  . Iron deficiency anemia    "when I was young"  . Lung nodules   . Microscopic hematuria   . Migraine    "2-3/wk" (12/03/2016)  . MS (multiple sclerosis) ([place])    questionable per pt  . Myocardial infarction ([place]) 2000   "mild one"  . Noncompliance   . Osteoporosis   . Peripheral neuropathy    weakness,numbness,and tingling. Takes Gabapentin daily  . Pneumonia 06/2015  . PTSD (post-traumatic stress disorder) dx'd 2016/2017   "was dx'd w/bipolar in 1991; replaced w/PTSD dx 2016/2017" (12/03/2016)  . Restless leg syndrome    takes Requip daily  . 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 Type II diabetes mellitus ([place])    "went off insulin in 2012/2013" (12/03/2016)    Past Surgical History:  Procedure Laterality Date  . ABDOMINAL AORTOGRAM N/A 12/03/2016   Procedure: Abdominal Aortogram;  Surgeon: Angelia Mould, MD;  Location: [facility];  Service: Cardiovascular;  Laterality: N/A;  . ABDOMINAL HYSTERECTOMY  12/1987  . ADENOIDECTOMY  1975  . ANKLE SURGERY Bilateral 1993; 1995 X2   "stabilzation done; 1 on the right; 2 on the left"  . APPENDECTOMY  1989  . BACK SURGERY    . BREAST LUMPECTOMY Left    "benign tumor"  . CARDIAC CATHETERIZATION N/A 01/31/2015   Procedure: Left Heart Cath and Coronary Angiography;   Surgeon: Burnell Blanks, MD;  Location: [facility];  Service: Cardiovascular;  Laterality: N/A;  . CARDIAC CATHETERIZATION  2009; 2011  . COLONOSCOPY    . DILATION AND CURETTAGE OF UTERUS    . ESOPHAGOGASTRODUODENOSCOPY    . LAPAROSCOPIC CHOLECYSTECTOMY  1996  . NASAL RECONSTRUCTION  1976  . NASAL SINUS SURGERY  1975  . POSTERIOR LUMBAR FUSION  2005  . RADIOLOGY WITH ANESTHESIA N/A 01/15/2016   Procedure: MRI LUMBAR SPINE WITHOUT;  Surgeon: Medication Radiologist, MD;  Location: [facility];  Service: Radiology;  Laterality: N/A;  . RADIOLOGY WITH ANESTHESIA N/A 06/29/2016   Procedure: MRI OF THE BRAIN WITH AND WITHOUT;  Surgeon: Medication Radiologist, MD;  Location: [facility];  Service: Radiology;  Laterality: N/A;  . RENAL ANGIOGRAPHY N/A 12/03/2016   Procedure: Renal Angiography;  Surgeon: Angelia Mould, MD;  Location: [facility];  Service: Cardiovascular;  Laterality: N/A;  . TONSILLECTOMY  1992  . TUBAL LIGATION    . 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 Coronary artery disease Father   . Emphysema Father   . Heart attack Father   . Stroke Father   . Cancer Father        Unsure of type   . Allergic rhinitis Father   . Depression Mother   . Cancer Mother        skin cancer  . Bipolar disorder Brother   . Drug abuse Brother   . Cancer Brother        leukemia  . Bipolar disorder Daughter   . Allergic rhinitis Daughter   . Asthma Daughter   . Urticaria Daughter   . Diabetes Maternal Grandmother   . Stomach cancer Maternal Grandfather   . Cancer Maternal Grandfather        stomach   . Angioedema Neg Hx   . Atopy Neg Hx   . Eczema Neg Hx   . Immunodeficiency Neg Hx     Social History:  reports that she has been smoking cigarettes.  She started smoking about 46 years ago. She has a 90.00 pack-year smoking history. she has never used  smokeless tobacco. She reports that she does not drink alcohol or use drugs.  Allergies:  Allergies  Allergen Reactions  . Other Anaphylaxis and Swelling    [place]  . Penicillins Anaphylaxis    Has patient had a PCN reaction causing immediate rash, facial/tongue/throat swelling, SOB or lightheadedness with hypotension: Yes Has patient had a PCN reaction causing severe rash involving mucus membranes or skin necrosis: Yes Has patient had a PCN reaction that required hospitalization Yes Has patient had a PCN reaction occurring within the last 10 years: No If all of the above answers are "NO", then may proceed with Cephalosporin use.   . Zithromax [Azithromycin] Anaphylaxis  . Aspirin Other (See Comments)    Due to stomach ulcers.   . Pineapple Rash  . Strawberry Extract Rash and Hives  . Wheat Bran Other (See Comments)    Sneezing, rhinorrhea  . Yeast-Related Products Other (See Comments)    Sneezing, rhinorrhea  . Aspartame And Phenylalanine Palpitations  . Mushroom Extract Complex Rash  . Nicardipine Nausea And Vomiting and Other (See Comments)    shaking    Medications: Prior to Admission medications   Medication Sig Start Date End Date Taking? Authorizing Provider  albuterol (PROVENTIL HFA;VENTOLIN HFA) 108 (90 Base) MCG/ACT inhaler Inhale 2 puffs into the lungs every 6 (six) hours as needed. 06/09/16 06/09/17 Yes [provider]  amLODipine (NORVASC) 10 MG tablet Take 1 tablet (10 mg total) by mouth daily. 06/05/14  Yes Herminio Commons, MD  aspirin-acetaminophen-caffeine (EXCEDRIN MIGRAINE) [phone] MG tablet Take 2 tablets by mouth daily as needed for migraine.    Yes [provider]  atenolol (TENORMIN) 50 MG tablet Take 1 tablet (50 mg total) by mouth daily. 05/02/17  Yes Herminio Commons, MD  celecoxib (CELEBREX) 200 MG capsule Take 200 mg by mouth at bedtime. 05/02/17  Yes [provider]  chlorthalidone (HYGROTON) 25 MG tablet Take 25 mg  by mouth 2 (two) times daily.    Yes [provider]  clopidogrel (PLAVIX) 75 MG tablet Take 75 mg by  mouth daily.    Yes [provider]  dexlansoprazole (DEXILANT) 60 MG capsule Take 1 tab by mouth every morning. Patient taking differently: Take 60 mg by mouth every evening.  10/18/16  Yes Esterwood, Amy S, PA-C  furosemide (LASIX) 20 MG tablet Take 1 tablet (20 mg total) by mouth daily. 05/02/17 07/31/17 Yes Herminio Commons, MD  gabapentin (NEURONTIN) 600 MG tablet Take 600 mg by mouth 4 (four) times daily.   Yes [provider]  hydrALAZINE (APRESOLINE) 50 MG tablet Take 1 tablet (50 mg total) by mouth 3 (three) times daily. 05/02/17  Yes Herminio Commons, MD  losartan (COZAAR) 50 MG tablet Take 50 mg by mouth 2 (two) times daily.  06/09/16  Yes [provider]  methocarbamol (ROBAXIN) 500 MG tablet Take 1 tablet by mouth every 6 (six) hours as needed for muscle spasms.  05/02/17  Yes [provider]  potassium chloride SA (K-DUR,KLOR-CON) 20 MEQ tablet Take 1 tablet (20 mEq total) by mouth daily. 05/02/17  Yes Herminio Commons, MD  rOPINIRole (REQUIP) 4 MG tablet Take 4 mg by mouth at bedtime.   Yes [provider]  spironolactone (ALDACTONE) 100 MG tablet Take 100 mg by mouth daily.   Yes [provider]  zolpidem (AMBIEN CR) 12.5 MG CR tablet Take 12.5 mg by mouth at bedtime. 05/16/17  Yes [provider]    Scheduled Meds: .  stroke: mapping our early stages of recovery book   Does not apply Once  . aspirin  324 mg Oral Once  . cyanocobalamin  1,000 mcg Intramuscular Once  . gabapentin  600 mg Oral QID  . nicotine  21 mg Transdermal Daily  . pantoprazole  40 mg Oral Daily  . rOPINIRole  4 mg Oral QHS   Continuous Infusions: . methylPREDNISolone (SOLU-MEDROL) injection Stopped (05/18/17 2350)   PRN Meds:.acetaminophen **OR** acetaminophen (TYLENOL) oral liquid 160 mg/5 mL **OR** acetaminophen,  albuterol, butalbital-acetaminophen-caffeine, HYDROmorphone (DILAUDID) injection, labetalol, LORazepam, methocarbamol, morphine injection, ondansetron (ZOFRAN) IV, senna-docusate, zolpidem     Results for orders placed or performed during the hospital encounter of 05/16/17 (from the past 48 hour(s))  MRSA PCR Screening     Status: None   Collection Time: 05/18/17 10:51 AM  Result Value Ref Range   MRSA by PCR NEGATIVE NEGATIVE    Comment:        The GeneXpert MRSA Assay (FDA approved for NASAL specimens only), is one component of a comprehensive MRSA colonization surveillance program. It is not intended to diagnose MRSA infection nor to guide or monitor treatment for MRSA infections.   RPR     Status: None   Collection Time: 05/18/17  6:00 PM  Result Value Ref Range   RPR Ser Ql Non Reactive Non Reactive    Comment: (NOTE) Performed At: [facility] [place], [place] [id number] Rush Farmer MD [id number]   Vitamin B12     Status: None   Collection Time: 05/18/17  6:00 PM  Result Value Ref Range   Vitamin B-12 223 180 - 914 pg/mL    Comment: (NOTE) This assay is not validated for testing neonatal or myeloproliferative syndrome specimens for Vitamin B12 levels. Performed at [facility], [place] [address] [place], [place] [address]   Homocysteine     Status: Abnormal   Collection Time: 05/18/17  6:15 PM  Result Value Ref Range   Homocysteine 18.6 (H) 0.0 - 15.0 umol/L    Comment: (NOTE)  Performed At: [facility] [place], [place] [id number] Rush Farmer MD [id number]   Protein and glucose, CSF     Status: Abnormal   Collection Time: 05/19/17  9:55 AM  Result Value Ref Range   Glucose, CSF 83 (H) 40 - 70 mg/dL   Total  Protein, CSF 37 15 - 45 mg/dL  CSF cell count with differential     Status: Abnormal   Collection Time: 05/19/17  9:55 AM  Result Value Ref Range   Tube # 4    Color, CSF COLORLESS  COLORLESS   Appearance, CSF CLEAR (A) CLEAR   Supernatant NOT INDICATED    RBC Count, CSF 1 (H) 0 /cu mm   WBC, CSF <10 (H) 0 - 5 /cu mm    Comment: CORRECTED ON 12/13 AT 1301: PREVIOUSLY REPORTED AS 2   Other Cells, CSF TOO FEW TO COUNT, SMEAR AVAILABLE FOR REVIEW     Comment: PREDOMINANTLY LYMPH    Studies/Results:  MRI 06-2016 FINDINGS: Study performed under anesthesia.  Brain: Stable cerebral volume since 2015 which appears normal for age. No restricted diffusion to suggest acute infarction. No midline shift, mass effect, evidence of mass lesion, ventriculomegaly, extra-axial collection or acute intracranial hemorrhage. Cervicomedullary junction and pituitary are within normal limits.  Chronic scattered mostly subcortical cerebral white matter T2 and FLAIR hyperintensity has not significantly changed since 2015 and is in a nonspecific configuration. The periventricular white matter remains spared. No cortical encephalomalacia or chronic cerebral blood products are identified. Deep gray matter nuclei appear stable and normal. There is mild scattered patchy T2 hyperintensity in the pons which has not significantly changed since 2015. The cerebellum appears normal.  Vascular: Major intracranial vascular flow voids are stable and appear normal; dominant distal right vertebral artery.  Skull and upper cervical spine: Negative. Stable bone marrow signal which is within normal limits.  Sinuses/Orbits: The bilateral orbits soft tissues including the optic nerves appear within normal limits.  Visualized paranasal sinuses and mastoids are stable and well pneumatized.  Other: Visible internal auditory structures appear normal. Negative scalp soft tissues.  IMPRESSION: No acute intracranial abnormality and stable MRI appearance of the brain since 2015. Chronic subcortical cerebral white matter in patchy pontine signal abnormality has not significantly changed since  October 2015. The configuration is nonspecific but not typical for multiple sclerosis.     CTA HEAD NECK IMPRESSION: 1. No emergent intracranial large vessel occlusion. 2. 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 This is probably a chronic finding and would not account for the reported left-sided weakness. The intracranial posterior circulation is entirely patent. 3. Mild bilateral carotid bifurcation atherosclerotic calcification without hemodynamically significant stenosis. Aortic Atherosclerosis      The brain MRI scan from January of this year is reviewed in person. FLAIR imaging indicates that there are about 10 deep white matter plaque-like lesion couple that are perpendicular to the ventricle. No lesion of the corpus callosum is observed. There are 2 deep lesions involving the tegmentum of the pontine region on the left side consistent with ischemic changes. No acute lesions are noted. There is no hemorrhage seen.         BRAIN MRI 05-18-17 FINDINGS: The examination had to be discontinued prior to completion due to claustrophobia. Intermittently motion degraded diffusion-weighted imaging, sagittal T1 weighted imaging, axial T2 and FLAIR imaging was obtained.  Brain: Stable cerebral volume. No midline shift, mass effect, or evidence of intracranial  mass lesion. No ventriculomegaly. No evidence of acute intracranial hemorrhage or extra-axial collection.  No restricted diffusion or evidence of acute infarction.  Patchy and scattered chronic bilateral cerebral white matter T2 and FLAIR hyperintensity appears similar to the January MRI. Suggestion of new T2 hyperintensity in the left caudate nucleus near the left frontal horn since January with facilitated diffusion (series 14, image 14). Stable patchy T2 hyperintensity in the left pons. No cortical encephalomalacia  identified.  Vascular: Major intracranial vascular flow voids are grossly stable since the January MRI.  Skull and upper cervical spine: Not well evaluated.  Sinuses/Orbits: Appear stable.  Other: Stable trace mastoid fluid. Small volume retained secretions in the nasopharynx today. Stable scalp soft tissues.  IMPRESSION: 1. Intermittently motion degraded study which had to be discontinued prior to completion despite repeated imaging attempts. 2.  No acute intracranial abnormality identified. 3. Questionable chronic lacunar infarct in the left caudate which is new since January. Otherwise stable nonspecific cerebral white matter and pontine signal changes.      The brain MRI scan is reviewed in person. There is again this significant deep white matter increased signal involving the FLAIR imaging. There is evidence of a single new lesion compared to the previous scan involving the right frontal region. This could be near the caudate nuclei or adjacent white matter. Nothing acute is seen on DWI. Contrast was not given because the patient was claustrophobic and the scan had to be discontinued.         Dhairya Corales A. Merlene Laughter, M.D.  Diplomate, Tax adviser of Psychiatry and Neurology ( Neurology). 05/19/2017, 8:37 PM

## 2017-05-19 NOTE — Progress Notes (Signed)
Called MD to floor because there was a change in patients status.  Patient was weaker than earlier in shift, and was experiencing more numbness than earlier in assessment.  Patient also complained of more blurriness in vision.  Patient had to be helped to bed.  Patient exhibited slightly more slurred speech than her baseline.  MD came to floor and examined patient.  Patient sent for head cT.  Will continue to monitor patient.

## 2017-05-19 NOTE — Progress Notes (Signed)
Physical Therapy Treatment Patient Details Name: Katrina [name] MRN: [id number] DOB: 1960-09-03 Today's Date: 05/19/2017    History of Present Illness Katrina [name] is a 56 y.o. female with medical history significant for history of CVA with residual deficits, accelerated hypertension, and COPD, now presenting to the emergency department with numerous complaints, including intermittent chest pain, headache, and left-sided weakness.  Patient reports recurrent, intermittent pain in the left chest since yesterday, lasting approximately 1 minute at a time without radiation, and without alleviating or exacerbating factors identified.  She also reports some left-sided weakness, but has difficulty determining the time of onset, noting that some of it may be chronic and it seems to wax and wane.  Patient was suffering from a headache that developed gradually several hours ago, eased-off spontaneously, now returns. The headache is moderate in intensity, dull in character, localized to behind the left eye, similar to her chronic headaches, and without alleviating or exacerbating factors.  No fevers or chills and no significant dyspnea or cough.    PT Comments    Patient presents up in chair and states she is having a bad day due to severe headache, RLE numbness and blurry vision when standing.  Patient demonstrates good return for completing BLE exercises, slow labored cadence with frequent near loss of balance during gait training, required writer support her LUE when walking back to chair to assist with balancing.  Patient encouraged to ask for help when needing to go to bathroom with understanding acknowledged.  PLAN: attempt use of RW for gait training next visit    Follow Up Recommendations  Home health PT;Supervision - Intermittent     Equipment Recommendations  Other (comment)(AFO for left foot drop)    Recommendations for Other Services       Precautions / Restrictions  Precautions Precautions: Fall Restrictions Weight Bearing Restrictions: No    Mobility  Bed Mobility               General bed mobility comments: Patient presents seated in chair  Transfers Overall transfer level: Needs assistance Equipment used: Straight cane Transfers: Sit to/from Stand;Stand Pivot Transfers Sit to Stand: Min guard Stand pivot transfers: Min guard          Ambulation/Gait Ambulation/Gait assistance: Min assist Ambulation Distance (Feet): 15 Feet Assistive device: Straight cane Gait Pattern/deviations: Decreased step length - left;Decreased stance time - left;Decreased dorsiflexion - left;Decreased weight shift to left   Gait velocity interpretation: Below normal speed for age/gender General Gait Details: very unsteady to day with frequent leaning on nearby objects with LUE, excessive LLE internal rotation and dragging foot, limited secondary to fatigue and fair/poor standing balance   Stairs            Wheelchair Mobility    Modified Rankin (Stroke Patients Only)       Balance Overall balance assessment: Needs assistance Sitting-balance support: No upper extremity supported;Feet supported Sitting balance-Leahy Scale: Good     Standing balance support: Single extremity supported;During functional activity Standing balance-Leahy Scale: Poor Standing balance comment: fair/poor using SPC                            Cognition Arousal/Alertness: Awake/alert Behavior During Therapy: WFL for tasks assessed/performed Overall Cognitive Status: Within Functional Limits for tasks assessed  Exercises General Exercises - Lower Extremity Long Arc Quad: Seated;AROM;Strengthening;Both;15 reps Hip Flexion/Marching: Seated;AROM;Strengthening;Both;AAROM;15 reps Heel Raises: AROM;Seated;Strengthening;Right;5 reps    General Comments        Pertinent Vitals/Pain Pain Score:  10-Worst pain ever Pain Location: headache left side of head/face, numbness RLE Pain Descriptors / Indicators: Aching;Numbness Pain Intervention(s): Limited activity within patient's tolerance;Monitored during session;Premedicated before session    Home Living                      Prior Function            PT Goals (current goals can now be found in the care plan section) Acute Rehab PT Goals Patient Stated Goal: return home PT Goal Formulation: With patient Time For Goal Achievement: 05/21/17 Potential to Achieve Goals: Good Progress towards PT goals: Progressing toward goals    Frequency    7X/week      PT Plan Current plan remains appropriate    Co-evaluation              AM-PAC PT "6 Clicks" Daily Activity  Outcome Measure  Difficulty turning over in bed (including adjusting bedclothes, sheets and blankets)?: Unable Difficulty moving from lying on back to sitting on the side of the bed? : Unable Difficulty sitting down on and standing up from a chair with arms (e.g., wheelchair, bedside commode, etc,.)?: Unable Help needed moving to and from a bed to chair (including a wheelchair)?: A Little Help needed walking in hospital room?: A Lot Help needed climbing 3-5 steps with a railing? : Total 6 Click Score: 9    End of Session   Activity Tolerance: Patient limited by fatigue;Patient limited by pain Patient left: in chair;with call bell/phone within reach Nurse Communication: Mobility status PT Visit Diagnosis: Unsteadiness on feet (R26.81);Other abnormalities of gait and mobility (R26.89);Muscle weakness (generalized) (M62.81)     Time: 6237-6283 PT Time Calculation (min) (ACUTE ONLY): 26 min  Charges:  $Gait Training: 8-22 mins $Therapeutic Exercise: 8-22 mins                    G Codes:  Functional Assessment Tool Used: AM-PAC 6 Clicks Basic Mobility Functional Limitation: Mobility: Walking and moving around Mobility: Walking and Moving  Around Current Status (T5176): At least 60 percent but less than 80 percent impaired, limited or restricted Mobility: Walking and Moving Around Goal Status [phone]): At least 60 percent but less than 80 percent impaired, limited or restricted Mobility: Walking and Moving Around Discharge Status [phone]): At least 60 percent but less than 80 percent impaired, limited or restricted    3:39 PM, 05/19/17 Lonell Grandchild, MPT Physical Therapist with [facility] 336 [phone] office [phone] mobile phone

## 2017-05-19 NOTE — Progress Notes (Signed)
Pt. Stable.Back stable after l.p.F/U with her M.D.

## 2017-05-20 ENCOUNTER — Inpatient Hospital Stay (HOSPITAL_COMMUNITY): Payer: Medicare Other

## 2017-05-20 DIAGNOSIS — R29898 Other symptoms and signs involving the musculoskeletal system: Secondary | ICD-10-CM

## 2017-05-20 LAB — ANGIOTENSIN CONVERTING ENZYME, CSF: ANGIO CONVERT ENZYME: 0.7 U/L (ref 0.0–2.8)

## 2017-05-20 LAB — VDRL, CSF: SYPHILIS VDRL QUANT CSF: NONREACTIVE

## 2017-05-20 LAB — GLUCOSE, CAPILLARY: Glucose-Capillary: 164 mg/dL — ABNORMAL HIGH (ref 65–99)

## 2017-05-20 MED ORDER — HYDRALAZINE HCL 20 MG/ML IJ SOLN
5.0000 mg | INTRAMUSCULAR | Status: DC | PRN
  Administered 2017-05-20 – 2017-05-21 (×2): 5 mg via INTRAVENOUS
  Filled 2017-05-20 (×2): qty 1

## 2017-05-20 MED ORDER — DIPHENHYDRAMINE HCL 50 MG/ML IJ SOLN
12.5000 mg | Freq: Once | INTRAMUSCULAR | Status: AC
  Administered 2017-05-20: 12.5 mg via INTRAVENOUS
  Filled 2017-05-20: qty 1

## 2017-05-20 MED ORDER — PROCHLORPERAZINE MALEATE 5 MG PO TABS
5.0000 mg | ORAL_TABLET | Freq: Once | ORAL | Status: AC
Start: 2017-05-20 — End: 2017-05-20
  Administered 2017-05-20: 5 mg via ORAL
  Filled 2017-05-20: qty 1

## 2017-05-20 NOTE — Progress Notes (Signed)
Refusing all 10 am Blood pressure medications this am, Dr Adair Patter notified. Will continue to monitor patient.

## 2017-05-20 NOTE — Progress Notes (Signed)
PROGRESS NOTE    Katrina [name]  ALP:[id number] DOB: [birth year] DOA: 05/16/2017 PCP: Berkley Harvey, NP   Brief Narrative:  56 year old female with a history of gnawing hypertension, COPD, depression, and stroke with left hemiparesis 2016 presented with 1 day history of left-sided chest discomfort underneath her left breast radiating to the shoulder, worsening left-sided weakness on the evening of May 16, 2017, and headache.  The patient states that she had left-sided chest discomfort that has been on and off since afternoon of May 16, 2017 with some shortness of breath.  The patient states that she chronically has shortness of breath, but denied any nausea, vomiting, presyncope.  She denies any fevers, chills, vomiting, diarrhea, abdominal pain, dysuria, hematuria.  While walking to the living room around dinnertime, the patient noted worsening of her left upper and left lower extremity weakness.  She states there is worse than her usual baseline weakness.  She also had associated headache behind her left eye without any visual disturbance, dysarthria, dysesthesia.  She continues Symbicort and 1/2 packs/day.  She endorses compliance with all her medications.  Because of complaints, she presented for further evaluation.     Assessment & Plan:   Principal Problem:   CVA (cerebral vascular accident) [facility]) Active Problems:   Moderate COPD (chronic obstructive pulmonary disease) (HCC)   Uncontrolled hypertension   Type 2 diabetes, uncontrolled, with neuropathy (HCC)   Left-sided weakness   Atypical chest pain   Stroke (cerebrum) (HCC)   History of CVA with residual deficit   Weakness of left leg   Left hemiparesis -Likely a side effect of patient's multiple sclerosis flare -Discussed with neurologist down at [facility] who states patient has had a thorough workup and could only offer to continue her high-dose steroids for an additional 2 days for a total of 5  days -Neurology Consulted -Lumbar puncture unrevealing but oligoclonal bands would be unlikely to show up until early next week per neurology -Neurology concern for possible multiple sclerosis and started patient on high-dose IV steroids - PT recommending home health care / OT recommending outpatient OT -CTA H&N--no intracranial large vessel occlusion; 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 Systolic function was vigorous. The estimated ejection fraction was in the range of 65% to 70%. Wall motion was normal; there were no regional wall motion abnormalities. Doppler parameters are consistent with abnormal left ventricular relaxation (grade 1 diastolic dysfunction). Doppler parameters are consistent with high ventricular filling pressure. Mild concentric and mild to moderate focal basal septal hypertrophy.  Malignant hypertension -Patient presented with blood pressure 222/101 -Patient has refused all blood pressure medications this morning - She states she will not be taking 9 blood pressure medications like she was previously -She is agreeable to IV hydralazine as well as IV labetalol and will be agreeable to 1-2 oral blood pressure medications during hospitalization -July 2018 renal angiogram--negative for renal artery stenosis -Patient reports she has chronically elevated blood pressure at home with systolics at least in the 200s  Headache -Likely a combination of her multiple sclerosis flare as well as her blood pressure -We will give Compazine as well as Dilaudid and Benadryl to see if this helps with patient's headache  Atypical chest pain -Troponins negative x3 -Echocardiogram: Left ventricle: The cavity size was normal. Systolic function was vigorous. The estimated ejection fraction was in the  range  of 65% to 70%. Wall motion was normal; there were no regional wall motion abnormalities. Doppler parameters are consistent with abnormal left ventricular relaxation (grade 1 diastolic dysfunction). Doppler parameters are consistent with high   ventricular filling pressure. Mild concentric and mild to moderate focal basal septal hypertrophy. -LDL 69 -Coronary angiography on 01/31/15 showed no angiographic evidence of coronary disease with normal left ventricle systolic function    COPD/tobacco abuse -Stable on room air -Tobacco cessation discussed nicotine patch ordered  Diabetes mellitus type 2 -December 04, 2016 hemoglobin A1c 5.8 -Hemoglobin A1c of 5.9  Chronic daily headache -We will need outpatient neurology follow-up preferably with a headache specialist this was discussed at length with patient this afternoon  GERD -continue PPI     DVT prophylaxis: lovenox Code Status: Full code Family Communication: No family bedside he was discharged Disposition Plan: After today 2 additional days of high-dose IV steroids to treat multiple sclerosis flare   Consultants:   OT  PT  Procedures:   EEG on 05/18/17  Antimicrobials:   None   Subjective: Patient had increased weakness and slurring of words overnight and a code stroke was called and she underwent a CT scan of her head.  CT scan was unrevealing.  Patient was at baseline at time of exam.  She states that she is upset that she feels as though she has no information as to her diagnosis and that she is not gotten any better.  She mentions to me that if she is not better after the doses of IV steroids she may be interested in transferring to a higher level of care.  Objective: Vitals:   05/20/17 1447 05/20/17 1522 05/20/17 1524 05/20/17 1554  BP: (!) 199/89 (!) 198/103 (!) 198/103 (!) 198/103  Pulse:   79   Resp:      Temp:      TempSrc:      SpO2:   96% 96%  Weight:      Height:        Intake/Output Summary  (Last 24 hours) at 05/20/2017 1620 Last data filed at 05/20/2017 1400 Gross per 24 hour  Intake 480 ml  Output 1500 ml  Net -1020 ml   Filed Weights   05/16/17 2032 05/17/17 0122 05/17/17 0500  Weight: 97.5 kg (215 lb) 98.2 kg (216 lb 7.9 oz) 98.2 kg (216 lb 7.9 oz)    Examination:  General exam: No acute distress Respiratory system: Clear to auscultation. Respiratory effort normal. Cardiovascular system: S1 & S2 heard, RRR. No JVD, murmurs, rubs, gallops or clicks. No pedal edema. Gastrointestinal system: Abdomen is obese, nondistended, soft and nontender. No organomegaly or masses felt. Normal bowel sounds heard. Central nervous system: Alert and oriented.  Weakness at ankle on left side cranial nerves II through XII are grossly intact, weakness on the left side in dorsi and plantar flexion at the ankle as well as flexion at the hip in flexion extension at the elbow Extremities: Weakness on ankle on left side, able to move upper and lower extremities bilaterally Skin: No rashes, lesions or ulcers Psychiatry: Appropriate mood and affect    Data Reviewed: I have personally reviewed following labs and imaging studies  CBC: Recent Labs  Lab 05/16/17 2050 05/16/17 2111  WBC 12.9*  --   NEUTROABS 6.1  --   HGB 13.2 13.9  HCT 41.7 41.0  MCV 92.5  --   PLT 281  --    Basic Metabolic Panel: Recent Labs  Lab 05/16/17  2050 05/16/17 2111  NA 138 141  K 4.0 4.1  CL 105 101  CO2 25  --   GLUCOSE 150* 149*  BUN 25* 23*  CREATININE 1.04* 1.00  CALCIUM 9.1  --    GFR: Estimated Creatinine Clearance: 74.3 mL/min (by C-G formula based on SCr of 1 mg/dL). Liver Function Tests: Recent Labs  Lab 05/16/17 2050  AST 18  ALT 14  ALKPHOS 81  BILITOT 0.2*  PROT 7.0  ALBUMIN 3.7   Recent Labs  Lab 05/16/17 2050  LIPASE 26   No results for input(s): AMMONIA in the last 168 hours. Coagulation Profile: Recent Labs  Lab 05/16/17 2050  INR 1.01   Cardiac  Enzymes: Recent Labs  Lab 05/16/17 2050 05/17/17 0207  TROPONINI <0.03 <0.03   BNP (last 3 results) No results for input(s): PROBNP in the last 8760 hours. HbA1C: No results for input(s): HGBA1C in the last 72 hours. CBG: Recent Labs  Lab 05/16/17 2102  GLUCAP 145*   Lipid Profile: No results for input(s): CHOL, HDL, LDLCALC, TRIG, CHOLHDL, LDLDIRECT in the last 72 hours. Thyroid Function Tests: No results for input(s): TSH, T4TOTAL, FREET4, T3FREE, THYROIDAB in the last 72 hours. Anemia Panel: Recent Labs    05/18/17 1800  VITAMINB12 223   Sepsis Labs: No results for input(s): PROCALCITON, LATICACIDVEN in the last 168 hours.  Recent Results (from the past 240 hour(s))  MRSA PCR Screening     Status: None   Collection Time: 05/18/17 10:51 AM  Result Value Ref Range Status   MRSA by PCR NEGATIVE NEGATIVE Final    Comment:        The GeneXpert MRSA Assay (FDA approved for NASAL specimens only), is one component of a comprehensive MRSA colonization surveillance program. It is not intended to diagnose MRSA infection nor to guide or monitor treatment for MRSA infections.          Radiology Studies: Ct Head Wo Contrast  Result Date: 05/20/2017 CLINICAL DATA:  Subacute onset of altered level of consciousness. Initial encounter. EXAM: CT HEAD WITHOUT CONTRAST TECHNIQUE: Contiguous axial images were obtained from the base of the skull through the vertex without intravenous contrast. COMPARISON:  CT of the head performed 05/16/2017, and MRI of the brain performed 05/18/2017 FINDINGS: Brain: No evidence of acute infarction, hemorrhage, hydrocephalus, extra-axial collection or mass lesion/mass effect. The posterior fossa, including the cerebellum, brainstem and fourth ventricle, is within normal limits. The third and lateral ventricles, and basal ganglia are unremarkable in appearance. The cerebral hemispheres are symmetric in appearance, with normal gray-white  differentiation. No mass effect or midline shift is seen. Vascular: No hyperdense vessel or unexpected calcification. Skull: There is no evidence of fracture; visualized osseous structures are unremarkable in appearance. Sinuses/Orbits: The orbits are within normal limits. The paranasal sinuses and mastoid air cells are well-aerated. Other: No significant soft tissue abnormalities are seen. IMPRESSION: Unremarkable noncontrast CT of the head. Electronically Signed   By: Garald Balding M.D.   On: 05/20/2017 00:14   Dg Fluoro Guided Loc Of Needle/cath Tip For Spinal Inject Lt  Result Date: 05/19/2017 CLINICAL DATA:  MS. EXAM: DIAGNOSTIC LUMBAR PUNCTURE UNDER FLUOROSCOPIC GUIDANCE FLUOROSCOPY TIME:  Fluoroscopy Time:  0 minutes 48 seconds Radiation Exposure Index (if provided by the fluoroscopic device): 25.9 mGy Number of Acquired Spot Images: 1 PROCEDURE: Informed consent was obtained from the patient prior to the procedure, including potential complications of headache, allergy, and pain. With the patient prone, the lower back was  prepped with Betadine. 1% Lidocaine was used for local anesthesia. Lumbar puncture was performed at the L4-L5 level using a 22 gauge needle with return of clear CSF with an opening pressure of 10 cm water. Ten ml of CSF were obtained for laboratory studies. The patient tolerated the procedure well and there were no apparent complications. IMPRESSION: Successful fluoroscopically directed lumbar puncture. Electronically Signed   By: Marcello Moores  Register   On: 05/19/2017 10:39        Scheduled Meds: .  stroke: mapping our early stages of recovery book   Does not apply Once  . aspirin  324 mg Oral Once  . chlorthalidone  25 mg Oral BID  . cyanocobalamin  1,000 mcg Intramuscular Once  . cyanocobalamin  1,000 mcg Intramuscular Once  . diphenhydrAMINE  12.5 mg Intravenous Once  . gabapentin  600 mg Oral QID  . losartan  50 mg Oral BID  . nicotine  21 mg Transdermal Daily  .  pantoprazole  40 mg Oral Daily  . prochlorperazine  5 mg Oral Once  . rOPINIRole  4 mg Oral QHS   Continuous Infusions: . methylPREDNISolone (SOLU-MEDROL) injection Stopped (05/19/17 2314)     LOS: 1 day    Time spent: 40 minutes    Loretha Stapler, MD [facility] Pager [phone]  If 7PM-7AM, please contact night-coverage [email or web address] Password [email or web address] 05/20/2017, 4:20 PM

## 2017-05-20 NOTE — Progress Notes (Addendum)
Called by RN that patient was experiencing worsening of her symptoms of numbness, weakness, slurred speech. Patient was earlier examined by neurologist, and most of her symptoms were attributed to multifactorial causes including uncontrolled hypertension, psychosomatic, multiple sclerosis. On my examination, patient had 4/5 strength in both upper extremities, patient had 3/5 strength in both lower extremities. Also had left facial droop.  Stat CT head was obtained which was negative for stroke.  Called and discussed with neurologist Dr Merlene Laughter, he feels that patient symptoms are multifactorial.  Will restart antihypertensive medications as patient blood pressure has been uncontrolled due to holding her home medications for hypertension.  Time spent 35 minutes

## 2017-05-20 NOTE — Progress Notes (Signed)
Physical Therapy Treatment Patient Details Name: Katrina [name] MRN: [id number] DOB: [birth year] Today's Date: 05/20/2017    History of Present Illness Katrina [name] is a 56 y.o. female with medical history significant for history of CVA with residual deficits, accelerated hypertension, and COPD, now presenting to the emergency department with numerous complaints, including intermittent chest pain, headache, and left-sided weakness.  Patient reports recurrent, intermittent pain in the left chest since yesterday, lasting approximately 1 minute at a time without radiation, and without alleviating or exacerbating factors identified.  She also reports some left-sided weakness, but has difficulty determining the time of onset, noting that some of it may be chronic and it seems to wax and wane.  Patient was suffering from a headache that developed gradually several hours ago, eased-off spontaneously, now returns. The headache is moderate in intensity, dull in character, localized to behind the left eye, similar to her chronic headaches, and without alleviating or exacerbating factors.  No fevers or chills and no significant dyspnea or cough.    PT Comments    Pt sitting in chair bird bathing herself upon therapist entrance.  Pt friendly and willing to participate with therapy today.  Reports of continous head ache that has moved to the Rt rather than Lt and c/o pain behind Lt knee with edema present.  Pt able to complete sit to stand with supervision and cueing for hand placement to assist with sit to stand and safety.  Used RW this session to improve stability with stance.  Gait training with cueing to improve mechanics with UE to push forward with walker rather than down to increase ease with gait, cueing for posture and LE mechanics as well. 18 feet into gait training pt pauses and begins to tremore/very shaky and becomes somewhat unresponsive standing.  Called for assistance and a chair was brought for  help.  Vitals assessed, pt with BP at 198/146mmHg with O2 sat at 96%.  Slurred speech and difficulty with short term memory.  RN and MD made aware and responded fast.  Pt left in bed with MD in room.     Follow Up Recommendations  Home health PT;Supervision - Intermittent     Equipment Recommendations  Other (comment)(AFO for Lt foot drop)    Recommendations for Other Services       Precautions / Restrictions Precautions Precautions: Fall Restrictions Weight Bearing Restrictions: No    Mobility  Bed Mobility               General bed mobility comments: Patient presents seated in chair  Transfers Overall transfer level: Needs assistance Equipment used: Rolling walker (2 wheeled) Transfers: Sit to/from Stand Sit to Stand: Min guard         General transfer comment: cueing for hand placement with sit to stand for safety.  Able to stand stable with minimal LOB initially.  Ambulation/Gait Ambulation/Gait assistance: Min assist;Mod assist Ambulation Distance (Feet): 18 Feet Assistive device: Rolling walker (2 wheeled) Gait Pattern/deviations: Decreased step length - left;Decreased stance time - left;Decreased dorsiflexion - left;Decreased weight shift to left   Gait velocity interpretation: Below normal speed for age/gender General Gait Details: Able to stand stable at beginning of gait with cueing to reduce shoulder elevation and push forward not down onto RW to progress gait.  Excessive Lt LE IR and dragging foot.     Stairs            Wheelchair Mobility    Modified Rankin (Stroke Patients Only)  Balance                                            Cognition Arousal/Alertness: Awake/alert Behavior During Therapy: WFL for tasks assessed/performed Overall Cognitive Status: Impaired/Different from baseline Area of Impairment: Memory                     Memory: Decreased short-term memory         General Comments:  Pt initially able to reports name, DOB and current location.  Following gait slurd speech and increased time for recall.  Did not recognize therapist following short retreival of ice/pillow for knee pain control      Exercises      General Comments        Pertinent Vitals/Pain Pain Score: 7  Pain Location: headache right side of head/face, posterior Lt knee pain Pain Descriptors / Indicators: Aching;Numbness Pain Intervention(s): Limited activity within patient's tolerance;Monitored during session;Repositioned;Other (comment)(RN and MD aware of tx)    Home Living                      Prior Function            PT Goals (current goals can now be found in the care plan section) Progress towards PT goals: Not progressing toward goals - comment    Frequency    7X/week      PT Plan Current plan remains appropriate    Co-evaluation              AM-PAC PT "6 Clicks" Daily Activity  Outcome Measure  Difficulty turning over in bed (including adjusting bedclothes, sheets and blankets)?: Unable Difficulty moving from lying on back to sitting on the side of the bed? : Unable Difficulty sitting down on and standing up from a chair with arms (e.g., wheelchair, bedside commode, etc,.)?: Unable Help needed moving to and from a bed to chair (including a wheelchair)?: A Little Help needed walking in hospital room?: A Lot Help needed climbing 3-5 steps with a railing? : Total 6 Click Score: 9    End of Session   Activity Tolerance: Patient limited by fatigue;Patient limited by pain;Treatment limited secondary to medical complications (Comment)   Nurse Communication: Mobility status PT Visit Diagnosis: Unsteadiness on feet (R26.81);Other abnormalities of gait and mobility (R26.89);Muscle weakness (generalized) (M62.81)     Time: 8921-1941 PT Time Calculation (min) (ACUTE ONLY): 38 min  Charges:  $Therapeutic Activity: 23-37 mins                    G Codes:        Ihor Austin, LPTA; CBIS [phone]   Aldona Lento 05/20/2017, 5:25 PM

## 2017-05-20 NOTE — Discharge Instructions (Signed)
Multiple Sclerosis °Multiple sclerosis (MS) is a disease of the central nervous system. It leads to the loss of the insulating covering of the nerves (myelin sheath) of your brain. When this happens, brain signals do not get sent properly or may not get sent at all. The age of onset of MS varies. °What are the causes? °The cause of MS is unknown. However, it is more common in the [place] than in the southern [place]. °What increases the risk? °There is a higher number of women with MS than men. MS is not an illness that is passed down to you from your family members (inherited). However, your risk of MS is higher if you have a relative with MS. °What are the signs or symptoms? °The symptoms of MS occur in episodes or attacks. These attacks may last weeks to months. There may be long periods of almost no symptoms between attacks. The symptoms of MS vary. This is because of the many different ways it affects the central nervous system. The main symptoms of MS include: °· Vision problems and eye pain. °· Numbness. °· Weakness. °· Inability to move your arms, hands, feet, or legs (paralysis). °· Balance problems. °· Tremors. °How is this diagnosed? °Your health care provider can diagnose MS with the help of imaging exams and lab tests. These may include specialized X-ray exams and spinal fluid tests. The best imaging exam to confirm a diagnosis of MS is an MRI. °How is this treated? °There is no known cure for MS, but there are medicines that can decrease the number and frequency of attacks. Steroids are often used for short-term relief. Physical and occupational therapy may also help. There are also many new alternative or complementary treatments available to help control the symptoms of MS. Ask your health care provider if any of these other options are right for you. °Follow these instructions at home: °· Take medicines as directed by your health care provider. °· Exercise as directed by your  health care provider. °Contact a health care provider if: °You begin to feel depressed. °Get help right away if: °· You develop paralysis. °· You have problems with bladder, bowel, or sexual function. °· You develop mental changes, such as forgetfulness or mood swings. °· You have a period of uncontrolled movements (seizure). °This information is not intended to replace advice given to you by your health care provider. Make sure you discuss any questions you have with your health care provider. °Document Released: 05/21/2000 Document Revised: 10/30/2015 Document Reviewed: 01/29/2013 °[employer] Interactive Patient Education © 2017 [employer] ° °

## 2017-05-20 NOTE — Progress Notes (Signed)
Katrina A. Merlene Laughter, MD     [email or web address]          Katrina [name] is an 56 y.o. female.   ASSESSMENT/PLAN: 1. Recurrent episodes of left-sided numbness, weakness and tingling of unclear etiology: Depression diagnosis includes complicated migraine headache/my headaches with aura, multiple sclerosis, psychogenic disorders, complications from elevated blood pressure and ischemic stroke. Ischemic stroke is less likely however given the the recurrent nature and negative imaging. Physical examination showed evidence that suggest organic weakness but with evidence of none physiologic/nonanatomic/ psychogenic overlay. The MRI findings are worrisome for multiple sclerosis. She is to get 3 doses of Solu-Medrol thousand milligrams daily.  I recommend that she does not get any additional doses as this could contribute to her already high blood pressure. Follow-up should be in our office in one month.  2. Episodic headaches: She has a history of migraine headaches. Fioricet will be given for abortive treatment.  3. Multiple other symptoms included vertiginous symptoms, left facial pain and numbness of the upper extremities possibly due to her baseline underlying condition which we suspect is MS.    4. Vitamin B12 deficiency: This will be replaced.  5.   Apparent subjective left gaze palsy but examination shows unequivocally that the the oculomotor system is intact. Again, this suggests psychosomatic issues.     The patient had an eventful night with the apparently global weakness but more focal on the left side.  The left side where she has her typical weakness.Blood pressure was quite high.  Her blood pressure has generally been high since he has been admitted but it was even higher last night.  It appears that she refused to take her antihypertensive medications since being admitted.  She also was not taking the antihypertensive medications at home.  She tells me that she does  not want to take nine pills.  I did explain to her that the poorly controlled blood pressure is causing significant complications including headaches, ischemia and the renal failure.  She needs to take these medications.  Her left-sided weakness has improved back to baseline this afternoon.  She reports that she still has had a spell earlier in the day this afternoon.  Again it swell with spells where she has focal weakness.  She also complains of numbness and tingling of the upper extremities.  She tells me that she cannot move her eyes to the left during the evaluation but this was overcome with oculocephalic reflexes indicating significant psychogenic feature.   GENERAL: This a pleasant lady who is in no acute distress.  HEENT: Supple neck and no trauma appreciated.  ABDOMEN: soft  EXTREMITIES: No edema   BACK: This is normal.  SKIN: Normal by inspection.    MENTAL STATUS: Alert and oriented. Speech, language and cognition are generally intact. Judgment and insight normal.   CRANIAL NERVES: Pupils are equal, round and reactive to light and accomodation; there is no significant nystagmus; visual fields seems to show constricted vision bilaterally but improved over yesterday; she has a more significant weakness of the left lower facial muscles. Shoulder shrug is weak on the left.  She has good extraocular movements except she reports that she cannot move her eyes cross midline to the left.  However, this is overcome with oculocephalic reflexes indicating significant psychogenic component.  MOTOR: Right deltoid 4+/5, right hand grip 5. Right hip flexion and dorsiflexion 5/5. Left deltoid 4+/5, left tricep 4 minus with clear evidence of giveaway weakness, hand grip 5. Left  hip flexion 3/5 in dorsiflexion 3.  COORDINATION: No dysmetria or tremors are noted on the right side. No clear dysmetria or tremors on the left side.        Blood pressure (!) 182/86, pulse 72, temperature 98.4 F  (36.9 C), temperature source Oral, resp. rate 18, height 5\' 6"  (1.676 m), weight 216 lb 7.9 oz (98.2 kg), SpO2 96 %.  Past Medical History:  Diagnosis Date  . Allergy   . Anemia 1975-1976   . Anginal pain ([place])   . Anxiety    takes Valium daily as needed  . Arthritis    "spine" (12/03/2016)  . Asthma    has inhalers but doesn't use (12/03/2016)  . CAD (coronary artery disease)    Moderate nonobstructive 2012-2013, [place]; No angiographic evidence of CAD 01/31/15 [facility]  . Chronic bronchitis ([place])    "get it a couple times q yr" (12/03/2016)  . Chronic kidney disease   . Chronic lower back pain    budlging disc   . Claustrophobia   . COPD (chronic obstructive pulmonary disease) ([place])   . Daily headache   . Depression   . Diverticulitis   . Family history of adverse reaction to anesthesia    2 daughters gets extremely sick   . GERD (gastroesophageal reflux disease)    takes Dexilant daily  . Heart murmur   . History of blood transfusion 1975-1976 "several"   no abnormal reaction noted  . History of colitis   . History of colon polyps    benign  . History of gastric ulcer   . History of hiatal hernia    "small one"  . History of MRSA infection 2017  . Hyperlipidemia    was on meds but has been off over a yr  . Hypertension    takes Amlodipine and Maxzide daily  . Insomnia    takes Ambien nightly  . Iron deficiency anemia    "when I was young"  . Lung nodules   . Microscopic hematuria   . Migraine    "2-3/wk" (12/03/2016)  . MS (multiple sclerosis) ([place])    questionable per pt  . Myocardial infarction ([place]) 2000   "mild one"  . Noncompliance   . Osteoporosis   . Peripheral neuropathy    weakness,numbness,and tingling. Takes Gabapentin daily  . Pneumonia 06/2015  . PTSD (post-traumatic stress disorder) dx'd 2016/2017   "was dx'd w/bipolar in 1991; replaced w/PTSD dx 2016/2017" (12/03/2016)  . Restless leg syndrome    takes Requip daily  . 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 Type II diabetes mellitus ([place])    "went off insulin in 2012/2013" (12/03/2016)    Past Surgical History:  Procedure Laterality Date  . ABDOMINAL AORTOGRAM N/A 12/03/2016   Procedure: Abdominal Aortogram;  Surgeon: Angelia Mould, MD;  Location: [facility];  Service: Cardiovascular;  Laterality: N/A;  . ABDOMINAL HYSTERECTOMY  12/1987  . ADENOIDECTOMY  1975  . ANKLE SURGERY Bilateral 1993; 1995 X2   "stabilzation done; 1 on the right; 2 on the left"  . APPENDECTOMY  1989  . BACK SURGERY    . BREAST LUMPECTOMY Left    "benign tumor"  . CARDIAC CATHETERIZATION N/A 01/31/2015   Procedure: Left Heart Cath and Coronary Angiography;  Surgeon: Burnell Blanks, MD;  Location: [facility];  Service: Cardiovascular;  Laterality: N/A;  . CARDIAC  CATHETERIZATION  2009; 2011  . COLONOSCOPY    . DILATION AND CURETTAGE OF UTERUS    . ESOPHAGOGASTRODUODENOSCOPY    . LAPAROSCOPIC CHOLECYSTECTOMY  1996  . NASAL RECONSTRUCTION  1976  . NASAL SINUS SURGERY  1975  . POSTERIOR LUMBAR FUSION  2005  . RADIOLOGY WITH ANESTHESIA N/A 01/15/2016   Procedure: MRI LUMBAR SPINE WITHOUT;  Surgeon: Medication Radiologist, MD;  Location: [facility];  Service: Radiology;  Laterality: N/A;  . RADIOLOGY WITH ANESTHESIA N/A 06/29/2016   Procedure: MRI OF THE BRAIN WITH AND WITHOUT;  Surgeon: Medication Radiologist, MD;  Location: [facility];  Service: Radiology;  Laterality: N/A;  . RENAL ANGIOGRAPHY N/A 12/03/2016   Procedure: Renal Angiography;  Surgeon: Angelia Mould, MD;  Location: [facility];  Service: Cardiovascular;  Laterality: N/A;  . TONSILLECTOMY  1992  . TUBAL LIGATION    . 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 Coronary artery disease Father   . Emphysema Father     . Heart attack Father   . Stroke Father   . Cancer Father        Unsure of type   . Allergic rhinitis Father   . Depression Mother   . Cancer Mother        skin cancer  . Bipolar disorder Brother   . Drug abuse Brother   . Cancer Brother        leukemia  . Bipolar disorder Daughter   . Allergic rhinitis Daughter   . Asthma Daughter   . Urticaria Daughter   . Diabetes Maternal Grandmother   . Stomach cancer Maternal Grandfather   . Cancer Maternal Grandfather        stomach   . Angioedema Neg Hx   . Atopy Neg Hx   . Eczema Neg Hx   . Immunodeficiency Neg Hx     Social History:  reports that she has been smoking cigarettes.  She started smoking about 46 years ago. She has a 90.00 pack-year smoking history. she has never used smokeless tobacco. She reports that she does not drink alcohol or use drugs.  Allergies:  Allergies  Allergen Reactions  . Other Anaphylaxis and Swelling    [place]  . Penicillins Anaphylaxis    Has patient had a PCN reaction causing immediate rash, facial/tongue/throat swelling, SOB or lightheadedness with hypotension: Yes Has patient had a PCN reaction causing severe rash involving mucus membranes or skin necrosis: Yes Has patient had a PCN reaction that required hospitalization Yes Has patient had a PCN reaction occurring within the last 10 years: No If all of the above answers are "NO", then may proceed with Cephalosporin use.   . Zithromax [Azithromycin] Anaphylaxis  . Aspirin Other (See Comments)    Due to stomach ulcers.   . Pineapple Rash  . Strawberry Extract Rash and Hives  . Wheat Bran Other (See Comments)    Sneezing, rhinorrhea  . Yeast-Related Products Other (See Comments)    Sneezing, rhinorrhea  . Aspartame And Phenylalanine Palpitations  . Mushroom Extract Complex Rash  . Nicardipine Nausea And Vomiting and Other (See Comments)    shaking    Medications: Prior to Admission medications   Medication Sig Start Date End Date  Taking? Authorizing Provider  albuterol (PROVENTIL HFA;VENTOLIN HFA) 108 (90 Base) MCG/ACT inhaler Inhale 2 puffs into the  lungs every 6 (six) hours as needed. 06/09/16 06/09/17 Yes [provider]  amLODipine (NORVASC) 10 MG tablet Take 1 tablet (10 mg total) by mouth daily. 06/05/14  Yes Herminio Commons, MD  aspirin-acetaminophen-caffeine (EXCEDRIN MIGRAINE) [phone] MG tablet Take 2 tablets by mouth daily as needed for migraine.    Yes [provider]  atenolol (TENORMIN) 50 MG tablet Take 1 tablet (50 mg total) by mouth daily. 05/02/17  Yes Herminio Commons, MD  celecoxib (CELEBREX) 200 MG capsule Take 200 mg by mouth at bedtime. 05/02/17  Yes [provider]  chlorthalidone (HYGROTON) 25 MG tablet Take 25 mg by mouth 2 (two) times daily.    Yes [provider]  clopidogrel (PLAVIX) 75 MG tablet Take 75 mg by mouth daily.    Yes [provider]  dexlansoprazole (DEXILANT) 60 MG capsule Take 1 tab by mouth every morning. Patient taking differently: Take 60 mg by mouth every evening.  10/18/16  Yes Esterwood, Amy S, PA-C  furosemide (LASIX) 20 MG tablet Take 1 tablet (20 mg total) by mouth daily. 05/02/17 07/31/17 Yes Herminio Commons, MD  gabapentin (NEURONTIN) 600 MG tablet Take 600 mg by mouth 4 (four) times daily.   Yes [provider]  hydrALAZINE (APRESOLINE) 50 MG tablet Take 1 tablet (50 mg total) by mouth 3 (three) times daily. 05/02/17  Yes Herminio Commons, MD  losartan (COZAAR) 50 MG tablet Take 50 mg by mouth 2 (two) times daily.  06/09/16  Yes [provider]  methocarbamol (ROBAXIN) 500 MG tablet Take 1 tablet by mouth every 6 (six) hours as needed for muscle spasms.  05/02/17  Yes [provider]  potassium chloride SA (K-DUR,KLOR-CON) 20 MEQ tablet Take 1 tablet (20 mEq total) by mouth daily. 05/02/17  Yes Herminio Commons, MD  rOPINIRole (REQUIP) 4 MG tablet Take 4 mg by mouth at bedtime.   Yes  [provider]  spironolactone (ALDACTONE) 100 MG tablet Take 100 mg by mouth daily.   Yes [provider]  zolpidem (AMBIEN CR) 12.5 MG CR tablet Take 12.5 mg by mouth at bedtime. 05/16/17  Yes [provider]    Scheduled Meds: .  stroke: mapping our early stages of recovery book   Does not apply Once  . aspirin  324 mg Oral Once  . chlorthalidone  25 mg Oral BID  . cyanocobalamin  1,000 mcg Intramuscular Once  . gabapentin  600 mg Oral QID  . losartan  50 mg Oral BID  . nicotine  21 mg Transdermal Daily  . pantoprazole  40 mg Oral Daily  . rOPINIRole  4 mg Oral QHS   Continuous Infusions: . methylPREDNISolone (SOLU-MEDROL) injection Stopped (05/19/17 2314)   PRN Meds:.acetaminophen **OR** acetaminophen (TYLENOL) oral liquid 160 mg/5 mL **OR** acetaminophen, albuterol, butalbital-acetaminophen-caffeine, hydrALAZINE, HYDROmorphone (DILAUDID) injection, labetalol, LORazepam, methocarbamol, ondansetron (ZOFRAN) IV, senna-docusate, zolpidem     Results for orders placed or performed during the hospital encounter of 05/16/17 (from the past 48 hour(s))  Protein and glucose, CSF     Status: Abnormal   Collection Time: 05/19/17  9:55 AM  Result Value Ref Range   Glucose, CSF 83 (H) 40 - 70 mg/dL   Total  Protein, CSF 37 15 - 45 mg/dL  Angiotensin converting enzyme, CSF     Status: None   Collection Time: 05/19/17  9:55 AM  Result Value Ref Range   Angio Convert Enzyme 0.7 0.0 - 2.8 U/L    Comment: (  NOTE) Results of this test are labeled for research purposes only by the assay's manufacturer. The performance characteristics of this assay have not been established by the manufacturer. The result should not be used for treatment or for diagnostic purposes without confirmation of the diagnosis by another medically established diagnostic product or procedure. The performance characteristics were determined by [facility]. Performed At: [facility] [place], [place] [id number] Rush Farmer MD [id number]   VDRL, CSF     Status: None   Collection Time: 05/19/17  9:55 AM  Result Value Ref Range   VDRL Quant, CSF Non Reactive Non Rea:<1:1    Comment: (NOTE) Performed At: [facility] [place], [place] [id number] Rush Farmer MD MG:[id number]   CSF cell count with differential     Status: Abnormal   Collection Time: 05/19/17  9:55 AM  Result Value Ref Range   Tube # 4    Color, CSF COLORLESS COLORLESS   Appearance, CSF CLEAR (A) CLEAR   Supernatant NOT INDICATED    RBC Count, CSF 1 (H) 0 /cu mm   WBC, CSF <10 (H) 0 - 5 /cu mm    Comment: CORRECTED ON 12/13 AT 1301: PREVIOUSLY REPORTED AS 2   Other Cells, CSF TOO FEW TO COUNT, SMEAR AVAILABLE FOR REVIEW     Comment: PREDOMINANTLY LYMPH  Glucose, capillary     Status: Abnormal   Collection Time: 05/20/17  5:00 PM  Result Value Ref Range   Glucose-Capillary 164 (H) 65 - 99 mg/dL   Comment 1 Notify RN    Comment 2 Document in Chart     Studies/Results:  MRI 06-2016 FINDINGS: Study performed under anesthesia.  Brain: Stable cerebral volume since 2015 which appears normal for age. No restricted diffusion to suggest acute infarction. No midline shift, mass effect, evidence of mass lesion, ventriculomegaly, extra-axial collection or acute intracranial hemorrhage. Cervicomedullary junction and pituitary are within normal limits.  Chronic scattered mostly subcortical cerebral white matter T2 and FLAIR hyperintensity has not significantly changed since 2015 and is in a nonspecific configuration. The periventricular white matter remains spared. No cortical encephalomalacia or chronic cerebral blood products are identified. Deep gray matter nuclei appear stable and normal. There is mild scattered patchy T2 hyperintensity in the pons which has not significantly changed since 2015. The cerebellum appears normal.  Vascular:  Major intracranial vascular flow voids are stable and appear normal; dominant distal right vertebral artery.  Skull and upper cervical spine: Negative. Stable bone marrow signal which is within normal limits.  Sinuses/Orbits: The bilateral orbits soft tissues including the optic nerves appear within normal limits.  Visualized paranasal sinuses and mastoids are stable and well pneumatized.  Other: Visible internal auditory structures appear normal. Negative scalp soft tissues.  IMPRESSION: No acute intracranial abnormality and stable MRI appearance of the brain since 2015. Chronic subcortical cerebral white matter in patchy pontine signal abnormality has not significantly changed since October 2015. The configuration is nonspecific but not typical for multiple sclerosis.     CTA HEAD NECK IMPRESSION: 1. No emergent intracranial large vessel occlusion. 2. 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 This is probably a chronic finding and would not account for the reported left-sided weakness. The intracranial posterior circulation is entirely patent. 3. Mild bilateral carotid bifurcation atherosclerotic calcification without hemodynamically significant stenosis. Aortic Atherosclerosis      The brain MRI scan from January of this year is reviewed in  person. FLAIR imaging indicates that there are about 10 deep white matter plaque-like lesion couple that are perpendicular to the ventricle. No lesion of the corpus callosum is observed. There are 2 deep lesions involving the tegmentum of the pontine region on the left side consistent with ischemic changes. No acute lesions are noted. There is no hemorrhage seen.         BRAIN MRI 05-18-17 FINDINGS: The examination had to be discontinued prior to completion due to claustrophobia. Intermittently motion degraded diffusion-weighted imaging, sagittal  T1 weighted imaging, axial T2 and FLAIR imaging was obtained.  Brain: Stable cerebral volume. No midline shift, mass effect, or evidence of intracranial mass lesion. No ventriculomegaly. No evidence of acute intracranial hemorrhage or extra-axial collection.  No restricted diffusion or evidence of acute infarction.  Patchy and scattered chronic bilateral cerebral white matter T2 and FLAIR hyperintensity appears similar to the January MRI. Suggestion of new T2 hyperintensity in the left caudate nucleus near the left frontal horn since January with facilitated diffusion (series 14, image 14). Stable patchy T2 hyperintensity in the left pons. No cortical encephalomalacia identified.  Vascular: Major intracranial vascular flow voids are grossly stable since the January MRI.  Skull and upper cervical spine: Not well evaluated.  Sinuses/Orbits: Appear stable.  Other: Stable trace mastoid fluid. Small volume retained secretions in the nasopharynx today. Stable scalp soft tissues.  IMPRESSION: 1. Intermittently motion degraded study which had to be discontinued prior to completion despite repeated imaging attempts. 2.  No acute intracranial abnormality identified. 3. Questionable chronic lacunar infarct in the left caudate which is new since January. Otherwise stable nonspecific cerebral white matter and pontine signal changes.      The brain MRI scan is reviewed in person. There is again this significant deep white matter increased signal involving the FLAIR imaging. There is evidence of a single new lesion compared to the previous scan involving the right frontal region. This could be near the caudate nuclei or adjacent white matter. Nothing acute is seen on DWI. Contrast was not given because the patient was claustrophobic and the scan had to be discontinued.         Vickki Igou A. Merlene [name], M.D.  Diplomate, Tax adviser of Psychiatry and Neurology (  Neurology). 05/20/2017, 6:47 PM

## 2017-05-20 NOTE — Progress Notes (Signed)
Patient was ambulating with PT in hallway, and all of a sudden became very weak,and slow to respond, speech was also slurred. B/P 198/103,hr 82, tremors also noted. Dr Adair Patter notified. Orders received,and given will continue to monitor patient.

## 2017-05-20 NOTE — Progress Notes (Signed)
Patient c/o epigastric pressure, MD notified.

## 2017-05-21 LAB — URINALYSIS, ROUTINE W REFLEX MICROSCOPIC
BILIRUBIN URINE: NEGATIVE
GLUCOSE, UA: NEGATIVE mg/dL
KETONES UR: NEGATIVE mg/dL
LEUKOCYTES UA: NEGATIVE
NITRITE: NEGATIVE
PH: 7 (ref 5.0–8.0)
PROTEIN: NEGATIVE mg/dL
Specific Gravity, Urine: 1.01 (ref 1.005–1.030)

## 2017-05-21 IMAGING — US US CAROTID DUPLEX BILAT
1 series · 13 of 24 positions shown · non-contrast
Comparison: 02/07/2015

CLINICAL DATA: Syncope, stroke symptoms

EXAM:
BILATERAL CAROTID DUPLEX ULTRASOUND
TECHNIQUE: Gray scale imaging, color Doppler and duplex ultrasound were
performed of bilateral carotid and vertebral arteries in the neck.

[Series 1: us carotid duplex bilat · 0.07mm/px · 13 of 96 slices shown]
[im 1/96]
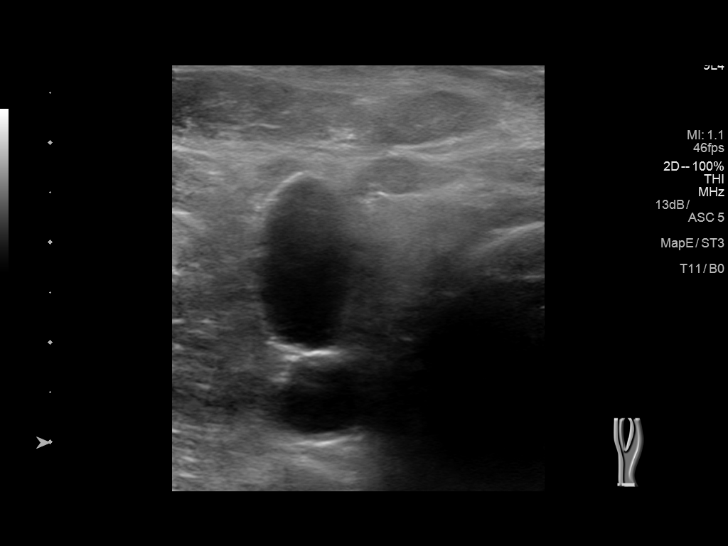
[im 9/96]
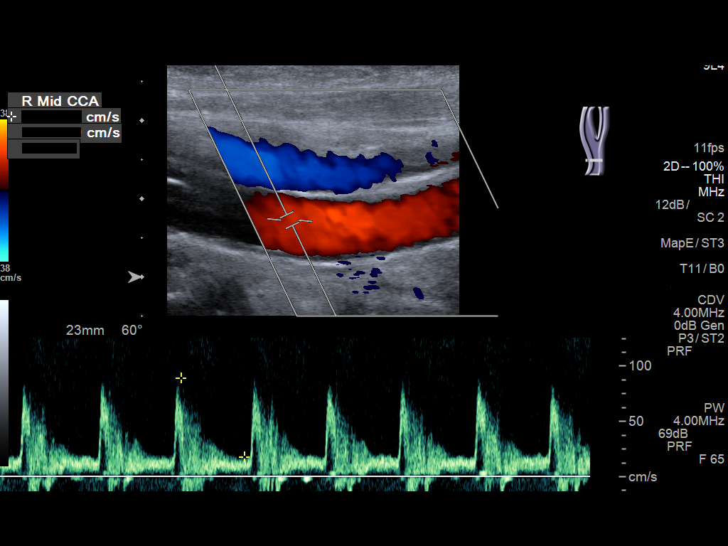
[im 17/96]
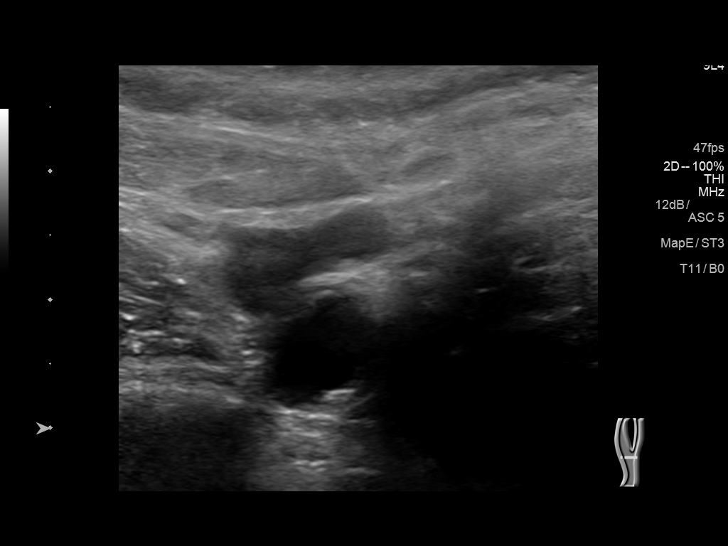
[im 25/96]
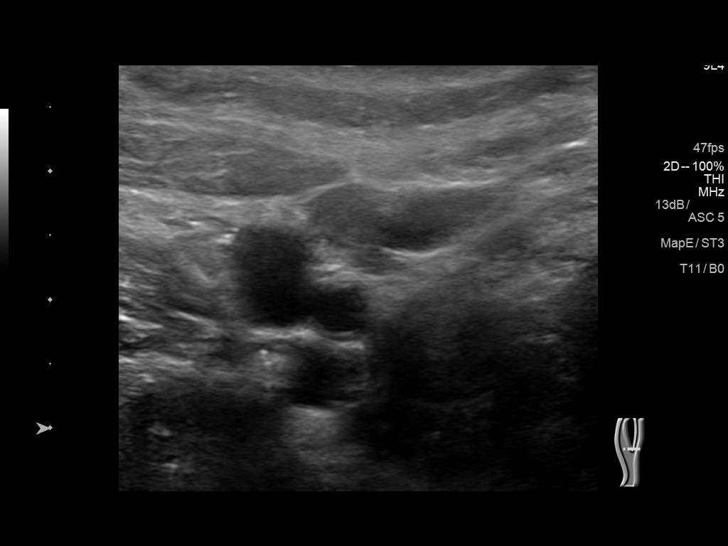
[im 34/96]
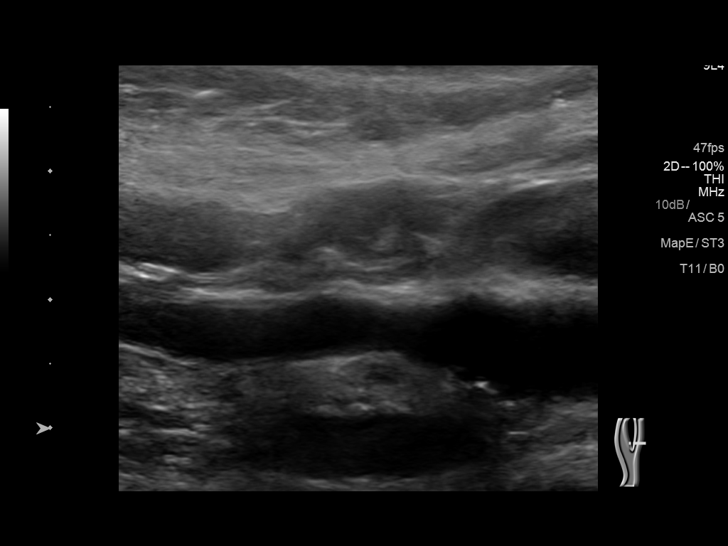
[im 42/96]
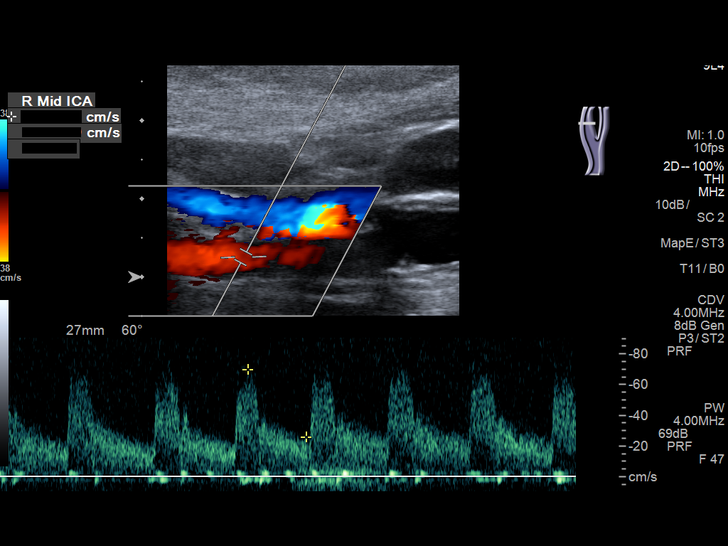
[im 50/96]
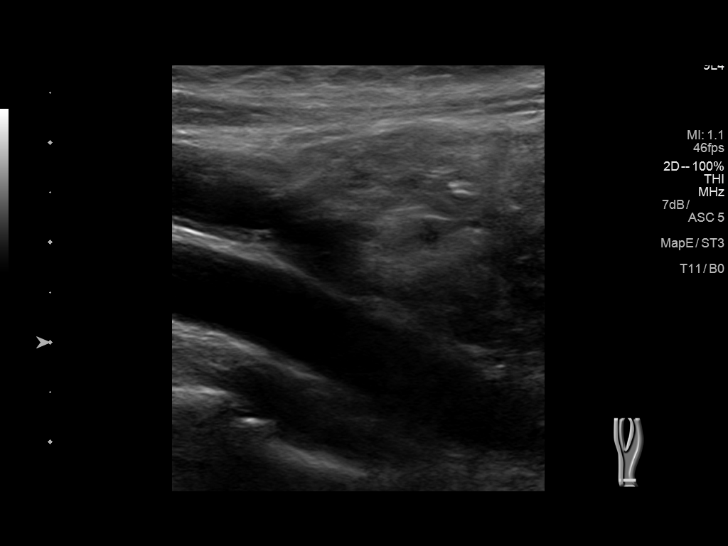
[im 54/96]
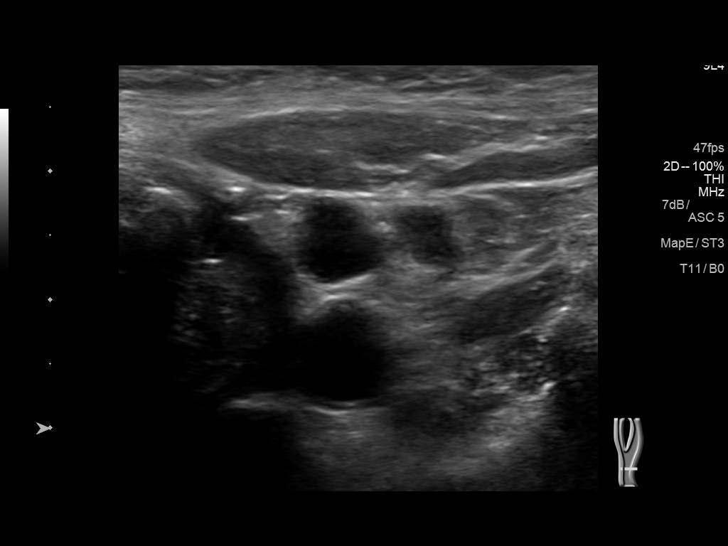
[im 62/96]
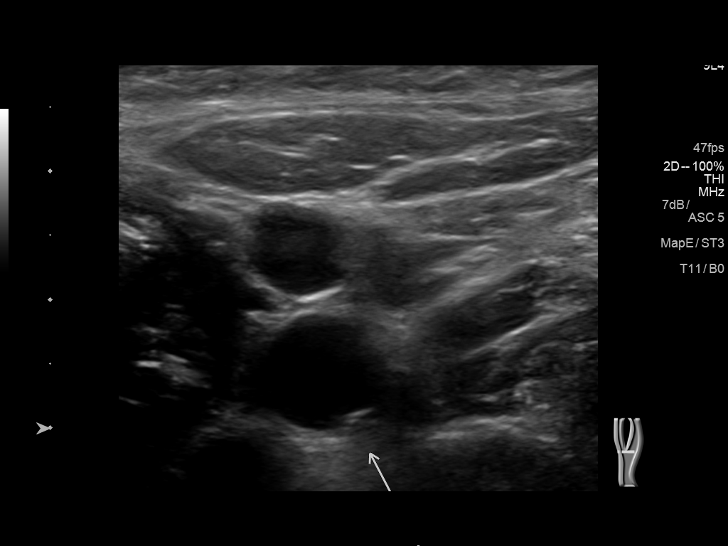
[im 71/96]
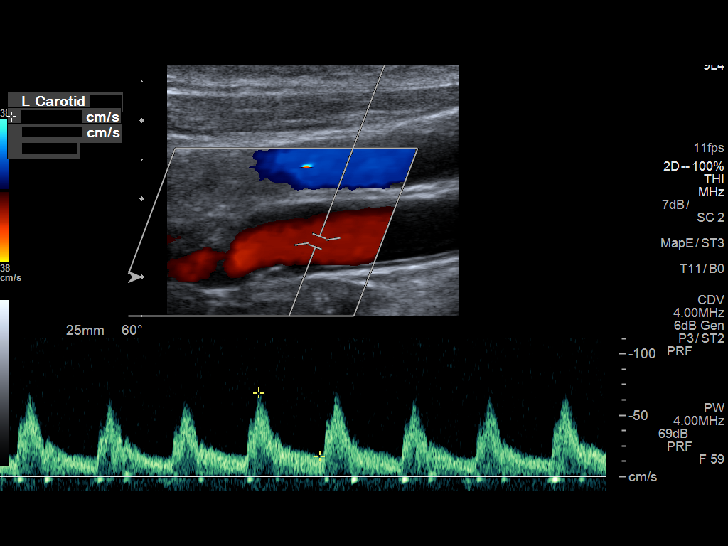
[im 79/96]
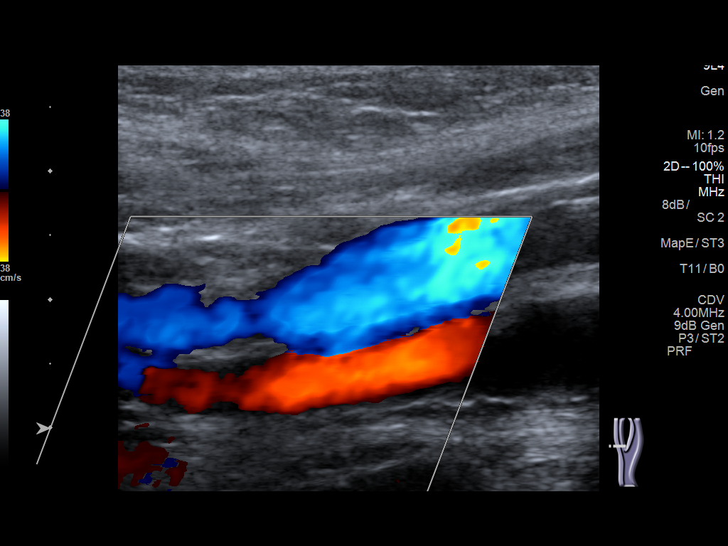
[im 87/96]
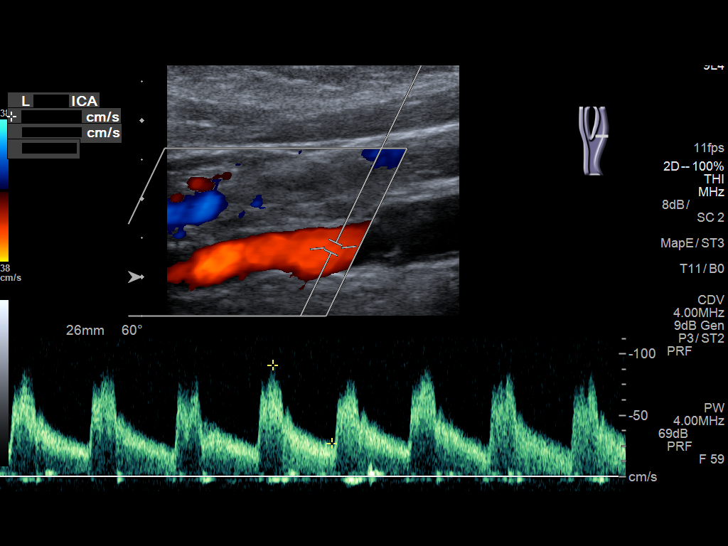
[im 96/96]
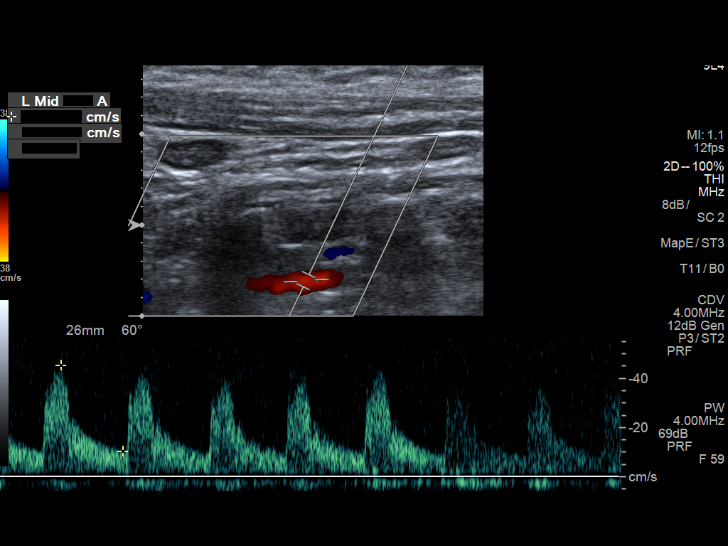

[13 of 24 positions shown; findings below may reference images not displayed]

FINDINGS: Criteria: Quantification of carotid stenosis is based on velocity
parameters that correlate the residual internal carotid diameter
with NASCET-based stenosis levels, using the diameter of the distal
internal carotid lumen as the denominator for stenosis measurement.

The following velocity measurements were obtained:

RIGHT

ICA:  85/30 cm/sec

CCA:  89/18 cm/sec

SYSTOLIC ICA/CCA RATIO:

DIASTOLIC ICA/CCA RATIO:

ECA:  157 cm/sec

LEFT

ICA:  104/36 cm/sec

CCA:  108/25 cm/sec

SYSTOLIC ICA/CCA RATIO:

DIASTOLIC ICA/CCA RATIO:

ECA:  125 cm/sec

RIGHT CAROTID ARTERY: Minor echogenic shadowing plaque formation. No
hemodynamically significant right ICA stenosis, velocity elevation,
or turbulent flow. Degree of narrowing less than 50%.

RIGHT VERTEBRAL ARTERY:  Antegrade

LEFT CAROTID ARTERY: Similar scattered minor echogenic plaque
formation. No hemodynamically significant left ICA stenosis,
velocity elevation, or turbulent flow.

LEFT VERTEBRAL ARTERY:  Antegrade
IMPRESSION: Minor carotid atherosclerosis. No hemodynamically significant ICA
stenosis. Degree of narrowing less than 50% bilaterally.

Patent antegrade vertebral flow bilaterally

## 2017-05-21 MED ORDER — DIPHENHYDRAMINE HCL 50 MG/ML IJ SOLN
12.5000 mg | Freq: Three times a day (TID) | INTRAMUSCULAR | Status: DC | PRN
  Administered 2017-05-22 (×2): 12.5 mg via INTRAVENOUS
  Filled 2017-05-21 (×2): qty 1

## 2017-05-21 MED ORDER — PROCHLORPERAZINE MALEATE 5 MG PO TABS
5.0000 mg | ORAL_TABLET | Freq: Three times a day (TID) | ORAL | Status: DC | PRN
  Administered 2017-05-21 – 2017-05-22 (×2): 5 mg via ORAL
  Filled 2017-05-21 (×2): qty 1

## 2017-05-21 MED ORDER — HYDRALAZINE HCL 20 MG/ML IJ SOLN
5.0000 mg | Freq: Four times a day (QID) | INTRAMUSCULAR | Status: DC
  Administered 2017-05-22 (×3): 5 mg via INTRAVENOUS
  Filled 2017-05-21 (×5): qty 1

## 2017-05-21 MED ORDER — ATENOLOL 25 MG PO TABS
50.0000 mg | ORAL_TABLET | Freq: Every day | ORAL | Status: DC
  Administered 2017-05-21 – 2017-05-23 (×3): 50 mg via ORAL
  Filled 2017-05-21 (×3): qty 2

## 2017-05-21 NOTE — Progress Notes (Signed)
Physical Therapy Treatment Patient Details Name: Katrina [name] MRN: [id number] DOB: 12/26/1960 Today's Date: 05/21/2017    History of Present Illness Katrina [name] is a 56 y.o. female with medical history significant for history of CVA with residual deficits, accelerated hypertension, and COPD, now presenting to the emergency department with numerous complaints, including intermittent chest pain, headache, and left-sided weakness.  Patient reports recurrent, intermittent pain in the left chest since yesterday, lasting approximately 1 minute at a time without radiation, and without alleviating or exacerbating factors identified.  She also reports some left-sided weakness, but has difficulty determining the time of onset, noting that some of it may be chronic and it seems to wax and wane.  Patient was suffering from a headache that developed gradually several hours ago, eased-off spontaneously, now returns. The headache is moderate in intensity, dull in character, localized to behind the left eye, similar to her chronic headaches, and without alleviating or exacerbating factors.  No fevers or chills and no significant dyspnea or cough.    PT Comments    Patient demonstrates good return for completing exercises while seated in chair.  Functional activity held due to BP 227/104 after taking BP medication - RN notified.  Patient will benefit from continued physical therapy in hospital and recommended venue below to increase strength, balance, endurance for safe ADLs and gait.    Follow Up Recommendations  Home health PT;Supervision - Intermittent     Equipment Recommendations  Other (comment)    Recommendations for Other Services       Precautions / Restrictions Precautions Precautions: Fall Restrictions Weight Bearing Restrictions: No    Mobility  Bed Mobility               General bed mobility comments: Patient presents seated in chair  Transfers                  General transfer comment: not tested  Ambulation/Gait             General Gait Details: not tested   Stairs            Wheelchair Mobility    Modified Rankin (Stroke Patients Only)       Balance Overall balance assessment: Needs assistance Sitting-balance support: No upper extremity supported;Feet supported Sitting balance-Leahy Scale: Good         Standing balance comment: not tested                            Cognition Arousal/Alertness: Awake/alert Behavior During Therapy: WFL for tasks assessed/performed Overall Cognitive Status: Within Functional Limits for tasks assessed                                        Exercises General Exercises - Lower Extremity Long Arc Quad: Seated;AROM;Strengthening;Both;15 reps Hip Flexion/Marching: Seated;AROM;Strengthening;Both;AAROM;15 reps Heel Raises: AROM;Seated;Strengthening;Right;15 reps    General Comments        Pertinent Vitals/Pain Pain Assessment: Faces Pain Score: 3  Pain Location: headache Pain Descriptors / Indicators: Aching Pain Intervention(s): Limited activity within patient's tolerance;Monitored during session    Home Living                      Prior Function            PT Goals (current goals can now be found  in the care plan section) Acute Rehab PT Goals Patient Stated Goal: return home PT Goal Formulation: With patient Time For Goal Achievement: 05/28/17 Potential to Achieve Goals: Good Progress towards PT goals: Progressing toward goals    Frequency    7X/week      PT Plan Current plan remains appropriate    Co-evaluation              AM-PAC PT "6 Clicks" Daily Activity  Outcome Measure  Difficulty turning over in bed (including adjusting bedclothes, sheets and blankets)?: Unable Difficulty moving from lying on back to sitting on the side of the bed? : Unable Difficulty sitting down on and standing up from a chair with  arms (e.g., wheelchair, bedside commode, etc,.)?: A Little Help needed moving to and from a bed to chair (including a wheelchair)?: A Little Help needed walking in hospital room?: A Little Help needed climbing 3-5 steps with a railing? : Total 6 Click Score: 12    End of Session   Activity Tolerance: Patient tolerated treatment well(Patient limited due to high BP) Patient left: in chair;with call bell/phone within reach Nurse Communication: Mobility status PT Visit Diagnosis: Unsteadiness on feet (R26.81);Other abnormalities of gait and mobility (R26.89);Muscle weakness (generalized) (M62.81)     Time: 1110-1130 PT Time Calculation (min) (ACUTE ONLY): 20 min  Charges:  $Therapeutic Exercise: 8-22 mins                    G Codes:       11:41 AM, May 27, 2017 Lonell Grandchild, MPT Physical Therapist with [facility] [phone] office [phone] mobile phone

## 2017-05-21 NOTE — Progress Notes (Signed)
PROGRESS NOTE    Katrina [name]  JOA:[id number] DOB: 06/28/1960 DOA: 05/16/2017 PCP: Berkley Harvey, NP   Brief Narrative:  56 year old female with a history of gnawing hypertension, COPD, depression, and stroke with left hemiparesis 2016 presented with 1 day history of left-sided chest discomfort underneath her left breast radiating to the shoulder, worsening left-sided weakness on the evening of May 16, 2017, and headache.  The patient states that she had left-sided chest discomfort that has been on and off since afternoon of May 16, 2017 with some shortness of breath.  The patient states that she chronically has shortness of breath, but denied any nausea, vomiting, presyncope.  She denies any fevers, chills, vomiting, diarrhea, abdominal pain, dysuria, hematuria.  While walking to the living room around dinnertime, the patient noted worsening of her left upper and left lower extremity weakness.  She states there is worse than her usual baseline weakness.  She also had associated headache behind her left eye without any visual disturbance, dysarthria, dysesthesia.  She continues Symbicort and 1/2 packs/day.  She endorses compliance with all her medications.  Because of complaints, she presented for further evaluation.     Assessment & Plan:   Principal Problem:   CVA (cerebral vascular accident) [facility]) Active Problems:   Moderate COPD (chronic obstructive pulmonary disease) (HCC)   Uncontrolled hypertension   Type 2 diabetes, uncontrolled, with neuropathy (HCC)   Left-sided weakness   Atypical chest pain   Stroke (cerebrum) (HCC)   History of CVA with residual deficit   Weakness of left leg   Left hemiparesis -Patient is still having sporadic episodes of left hemiparesis worsened than her baseline do agree that some of this could be psychogenic as patient is very emotional and is able to ambulate without assistance to the restroom as needed -Lumbar puncture unrevealing but  oligoclonal bands would be unlikely to show up until early next week per neurology -Patient has received 3 doses of high-dose IV steroid -Patient is significantly deconditioned and may benefit from skilled nursing facility placement at discharge -CTA H&N--no intracranial large vessel occlusion; 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  She agreed to IV hydralazine and IV labetalol. - We will schedule IV hydralazine   Atypical chest pain -Echocardiogram: Left ventricle: The cavity size was normal. Systolic function was vigorous. The estimated ejection fraction was in the range of 65% to 70%. Wall motion was normal; there were no regional wall motion abnormalities. Doppler parameters are consistent with abnormal left ventricular relaxation (grade 1 diastolic dysfunction). Doppler parameters are consistent with high ventricular filling pressure. Mild concentric and mild to moderate focal basal septal hypertrophy. -LDL 69 -Coronary angiography on 01/31/15 showed no angiographic evidence of coronary disease with normal left ventricle systolic function  -Troponins initially negative x3   COPD/tobacco abuse -Stable on room air -Tobacco cessation discussed nicotine patch ordered  Diabetes mellitus type 2 -Controlled outpatient  Chronic daily headache -Headache improved with IV Benadryl, Compazine, Dilaudid  GERD -continue PPI     DVT prophylaxis: lovenox Code Status: Full code Family Communication: No family bedside he was discharged Disposition Plan: Pending improvement in blood pressure, may benefit from skilled  nursing facility placement   Consultants:   OT  PT  Procedures:   EEG on 05/18/17  Antimicrobials:   None   Subjective: Patient very emotional at time of exam.  She voices that she feels as though she was mistreated last night.  She is very concerned that she will not be able to go home with her weakness, tremors, and headache.  She is open to short-term skilled rehab.  Objective: Vitals:   05/21/17 0559 05/21/17 1200 05/21/17 1254 05/21/17 1319  BP: (!) 173/85 (!) 210/104 (!) 231/122 (!) 194/98  Pulse: 68 84 80 78  Resp: 16 20 18    Temp: 97.7 F (36.5 C) 97.6 F (36.4 C) 97.7 F (36.5 C)   TempSrc: Oral Oral Oral   SpO2: 97% 97% 94%   Weight:      Height:        Intake/Output Summary (Last 24 hours) at 05/21/2017 1506 Last data filed at 05/21/2017 1254 Gross per 24 hour  Intake 720 ml  Output 2100 ml  Net -1380 ml   Filed Weights   05/16/17 2032 05/17/17 0122 05/17/17 0500  Weight: 97.5 kg (215 lb) 98.2 kg (216 lb 7.9 oz) 98.2 kg (216 lb 7.9 oz)    Examination:  General exam: Mildly distressed, crying Respiratory system: Clear to auscultation. Respiratory effort normal. Cardiovascular system: S1 & S2 heard, RRR. No JVD, murmurs, rubs, gallops or clicks. No pedal edema. Gastrointestinal system: Abdomen is obese, nondistended, soft and nontender. No organomegaly or masses felt. Normal bowel sounds heard. Central nervous system: Alert and oriented.  Weakness at ankle on left side cranial nerves II through XII are grossly intact, weakness on the left side in dorsi and plantar flexion at the ankle as well as flexion at the hip in flexion extension at the elbow Extremities: Weakness and left-sided hand grip, flexion and extension at elbow, plantar and dorsiflexion of left ankle and foot Skin: No rashes, lesions or ulcers Psychiatry: Anxious and sad    Data Reviewed: I have personally reviewed following labs and imaging studies  CBC: Recent Labs  Lab  05/16/17 2050 05/16/17 2111  WBC 12.9*  --   NEUTROABS 6.1  --   HGB 13.2 13.9  HCT 41.7 41.0  MCV 92.5  --   PLT 281  --    Basic Metabolic Panel: Recent Labs  Lab 05/16/17 2050 05/16/17 2111  NA 138 141  K 4.0 4.1  CL 105 101  CO2 25  --   GLUCOSE 150* 149*  BUN 25* 23*  CREATININE 1.04* 1.00  CALCIUM 9.1  --    GFR: Estimated Creatinine Clearance: 74.3 mL/min (by C-G formula based on SCr of 1 mg/dL). Liver Function Tests: Recent Labs  Lab 05/16/17 2050  AST 18  ALT 14  ALKPHOS 81  BILITOT 0.2*  PROT 7.0  ALBUMIN 3.7   Recent Labs  Lab 05/16/17 2050  LIPASE 26   No results for input(s): AMMONIA in the last 168 hours. Coagulation Profile: Recent Labs  Lab 05/16/17 2050  INR 1.01   Cardiac Enzymes: Recent Labs  Lab 05/16/17 2050 05/17/17 0207  TROPONINI <0.03 <0.03   BNP (last 3 results) No results for input(s): PROBNP in the last 8760 hours. HbA1C: No results for input(s): HGBA1C in the last 72 hours. CBG: Recent Labs  Lab 05/16/17 2102 05/20/17 1700  GLUCAP 145* 164*   Lipid Profile: No results for input(s): CHOL, HDL, LDLCALC, TRIG, CHOLHDL, LDLDIRECT in the last 72 hours. Thyroid Function Tests: No results for input(s): TSH, T4TOTAL, FREET4, T3FREE, THYROIDAB in  the last 72 hours. Anemia Panel: Recent Labs    05/18/17 1800  VITAMINB12 223   Sepsis Labs: No results for input(s): PROCALCITON, LATICACIDVEN in the last 168 hours.  Recent Results (from the past 240 hour(s))  MRSA PCR Screening     Status: None   Collection Time: 05/18/17 10:51 AM  Result Value Ref Range Status   MRSA by PCR NEGATIVE NEGATIVE Final    Comment:        The GeneXpert MRSA Assay (FDA approved for NASAL specimens only), is one component of a comprehensive MRSA colonization surveillance program. It is not intended to diagnose MRSA infection nor to guide or monitor treatment for MRSA infections.          Radiology Studies: Ct Head Wo  Contrast  Result Date: 05/20/2017 CLINICAL DATA:  Subacute onset of altered level of consciousness. Initial encounter. EXAM: CT HEAD WITHOUT CONTRAST TECHNIQUE: Contiguous axial images were obtained from the base of the skull through the vertex without intravenous contrast. COMPARISON:  CT of the head performed 05/16/2017, and MRI of the brain performed 05/18/2017 FINDINGS: Brain: No evidence of acute infarction, hemorrhage, hydrocephalus, extra-axial collection or mass lesion/mass effect. The posterior fossa, including the cerebellum, brainstem and fourth ventricle, is within normal limits. The third and lateral ventricles, and basal ganglia are unremarkable in appearance. The cerebral hemispheres are symmetric in appearance, with normal gray-white differentiation. No mass effect or midline shift is seen. Vascular: No hyperdense vessel or unexpected calcification. Skull: There is no evidence of fracture; visualized osseous structures are unremarkable in appearance. Sinuses/Orbits: The orbits are within normal limits. The paranasal sinuses and mastoid air cells are well-aerated. Other: No significant soft tissue abnormalities are seen. IMPRESSION: Unremarkable noncontrast CT of the head. Electronically Signed   By: Garald Balding M.D.   On: 05/20/2017 00:14        Scheduled Meds: .  stroke: mapping our early stages of recovery book   Does not apply Once  . aspirin  324 mg Oral Once  . atenolol  50 mg Oral Daily  . chlorthalidone  25 mg Oral BID  . cyanocobalamin  1,000 mcg Intramuscular Once  . gabapentin  600 mg Oral QID  . losartan  50 mg Oral BID  . nicotine  21 mg Transdermal Daily  . pantoprazole  40 mg Oral Daily  . rOPINIRole  4 mg Oral QHS   Continuous Infusions:    LOS: 2 days    Time spent: 30 minutes    Loretha Stapler, MD [facility] Pager [phone]  If 7PM-7AM, please contact night-coverage [email or web address] Password [email or web address] 05/21/2017, 3:06 PM

## 2017-05-22 MED ORDER — PHENOL 1.4 % MT LIQD
1.0000 | OROMUCOSAL | Status: DC | PRN
  Administered 2017-05-22: 1 via OROMUCOSAL
  Filled 2017-05-22: qty 177

## 2017-05-22 NOTE — Progress Notes (Signed)
Sitting in chair playing games on phone.  BP 157/80 and protested taking bp meds stating that unless her bp is over 200 she feels dizzy.  She did go ahead and take it

## 2017-05-22 NOTE — Progress Notes (Signed)
PROGRESS NOTE    Katrina [name]  WIO:[id number] DOB: Nov 11, 1960 DOA: 05/16/2017 PCP: Berkley Harvey, NP   Brief Narrative:  56 year old female with a history of gnawing hypertension, COPD, depression, and stroke with left hemiparesis 2016 presented with 1 day history of left-sided chest discomfort underneath her left breast radiating to the shoulder, worsening left-sided weakness on the evening of May 16, 2017, and headache.  The patient states that she had left-sided chest discomfort that has been on and off since afternoon of May 16, 2017 with some shortness of breath.  The patient states that she chronically has shortness of breath, but denied any nausea, vomiting, presyncope.  She denies any fevers, chills, vomiting, diarrhea, abdominal pain, dysuria, hematuria.  While walking to the living room around dinnertime, the patient noted worsening of her left upper and left lower extremity weakness.  She states there is worse than her usual baseline weakness.  She also had associated headache behind her left eye without any visual disturbance, dysarthria, dysesthesia.  She continues Symbicort and 1/2 packs/day.  She endorses compliance with all her medications.  Because of complaints, she presented for further evaluation.     Assessment & Plan:   Principal Problem:   CVA (cerebral vascular accident) [facility]) Active Problems:   Moderate COPD (chronic obstructive pulmonary disease) (HCC)   Uncontrolled hypertension   Type 2 diabetes, uncontrolled, with neuropathy (HCC)   Left-sided weakness   Atypical chest pain   Stroke (cerebrum) (HCC)   History of CVA with residual deficit   Weakness of left leg   Left hemiparesis  -multifactorial- chronic weakness on left side, worsened likely due to MS flare -Lumbar puncture unrevealing but oligoclonal bands would be unlikely to show up until early next week per neurology -T recommending SNF at time of discharge -CTA H&N--no intracranial large  vessel occlusion; 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  She agreed to IV hydralazine and IV labetalol. -Blood pressure better controlled on current regimen which includes atenolol, chlorthalidone, IV hydralazine, and losartan   Atypical chest pain -Echocardiogram: Left ventricle: The cavity size was normal. Systolic function was vigorous. The estimated ejection fraction was in the range of 65% to 70%. Wall motion was normal; there were no regional wall motion abnormalities. Doppler parameters are consistent with abnormal left ventricular relaxation (grade 1 diastolic dysfunction). Doppler parameters are consistent with high ventricular filling pressure. Mild concentric and mild to moderate focal basal septal hypertrophy. -Coronary angiography on 01/31/15 showed no angiographic evidence of coronary disease with normal left ventricle systolic function  -Troponins initially negative x3 - No current complaints of chest pain   COPD/tobacco abuse -Stable on room air -Tobacco cessation discussed nicotine patch ordered  Diabetes mellitus type 2 -Controlled outpatient  Chronic daily headache -Headache improved with IV Benadryl, Compazine, Dilaudid -No complaints of headache today  GERD -continue PPI     DVT prophylaxis: lovenox Code Status: Full code Family Communication: No family bedside he was discharged Disposition Plan: Patient agreeable to SNF at time of discharge; social work notified to begin process to get patient to skilled nursing facility  Consultants:   OT  PT  Neurology  Procedures:   EEG on 05/18/17  Lumbar  puncture  Antimicrobials:   None  Subjective: Patient is sitting watching television at time of exam.  She voices she had one episode of increased weakness last night.  She also reports her headache is gone.  She said that the medication of Compazine, Benadryl, Dilaudid together helped alleviate her headache.  Blood pressure much better controlled on scheduled IV hydralazine with 3 oral medications..  Objective: Vitals:   05/22/17 0003 05/22/17 0022 05/22/17 0608 05/22/17 1030  BP: (!) 156/134 (!) 141/100 131/67 (!) 157/80  Pulse: 76 76 68 70  Resp: 16 16 18 16   Temp:   98.4 F (36.9 C) 97.6 F (36.4 C)  TempSrc:   Oral Oral  SpO2: 96% 97% 95%   Weight:      Height:        Intake/Output Summary (Last 24 hours) at 05/22/2017 1142 Last data filed at 05/21/2017 2000 Gross per 24 hour  Intake 240 ml  Output 2100 ml  Net -1860 ml   Filed Weights   05/16/17 2032 05/17/17 0122 05/17/17 0500  Weight: 97.5 kg (215 lb) 98.2 kg (216 lb 7.9 oz) 98.2 kg (216 lb 7.9 oz)    Examination:  General exam: No acute distress Respiratory system: Clear to auscultation, normal respiratory effort Cardiovascular system: Regular rate and rhythm, S1-S2 heard, no murmurs, rubs rubs, gallops.  No pedal edema. Gastrointestinal system: Normal bowel sounds heard.  Abdomen is obese nondistended soft and nontender Central nervous system: Cranial nerves II through XII are grossly intact.  There is weakness on the left side that is appreciated in dorsi and plantar flexion at the ankle, flexion at the hip, flexion extension at the elbow as well as hand grip. Extremities: Weakness and left-sided hand grip, flexion and extension at elbow, plantar and dorsiflexion of left ankle and foot Skin: No rashes, lesions or ulcers Psychiatry: Appropriate mood and affect    Data Reviewed: I have personally reviewed following labs and imaging studies  CBC: Recent Labs  Lab 05/16/17 2050 05/16/17 2111  WBC 12.9*   --   NEUTROABS 6.1  --   HGB 13.2 13.9  HCT 41.7 41.0  MCV 92.5  --   PLT 281  --    Basic Metabolic Panel: Recent Labs  Lab 05/16/17 2050 05/16/17 2111  NA 138 141  K 4.0 4.1  CL 105 101  CO2 25  --   GLUCOSE 150* 149*  BUN 25* 23*  CREATININE 1.04* 1.00  CALCIUM 9.1  --    GFR: Estimated Creatinine Clearance: 74.3 mL/min (by C-G formula based on SCr of 1 mg/dL). Liver Function Tests: Recent Labs  Lab 05/16/17 2050  AST 18  ALT 14  ALKPHOS 81  BILITOT 0.2*  PROT 7.0  ALBUMIN 3.7   Recent Labs  Lab 05/16/17 2050  LIPASE 26   No results for input(s): AMMONIA in the last 168 hours. Coagulation Profile: Recent Labs  Lab 05/16/17 2050  INR 1.01   Cardiac Enzymes: Recent Labs  Lab 05/16/17 2050 05/17/17 0207  TROPONINI <0.03 <0.03   BNP (last 3 results) No results for input(s): PROBNP in the last 8760 hours. HbA1C: No results for input(s): HGBA1C in the last 72 hours. CBG: Recent Labs  Lab 05/16/17 2102 05/20/17 1700  GLUCAP 145* 164*   Lipid Profile: No results for input(s): CHOL, HDL, LDLCALC, TRIG, CHOLHDL, LDLDIRECT in the last 72 hours. Thyroid Function Tests: No results for input(s): TSH, T4TOTAL, FREET4, T3FREE, THYROIDAB in the last 72 hours. Anemia Panel: No results for input(s): VITAMINB12, FOLATE, FERRITIN,  TIBC, IRON, RETICCTPCT in the last 72 hours. Sepsis Labs: No results for input(s): PROCALCITON, LATICACIDVEN in the last 168 hours.  Recent Results (from the past 240 hour(s))  MRSA PCR Screening     Status: None   Collection Time: 05/18/17 10:51 AM  Result Value Ref Range Status   MRSA by PCR NEGATIVE NEGATIVE Final    Comment:        The GeneXpert MRSA Assay (FDA approved for NASAL specimens only), is one component of a comprehensive MRSA colonization surveillance program. It is not intended to diagnose MRSA infection nor to guide or monitor treatment for MRSA infections.          Radiology Studies: No results  found.      Scheduled Meds: .  stroke: mapping our early stages of recovery book   Does not apply Once  . aspirin  324 mg Oral Once  . atenolol  50 mg Oral Daily  . chlorthalidone  25 mg Oral BID  . cyanocobalamin  1,000 mcg Intramuscular Once  . gabapentin  600 mg Oral QID  . hydrALAZINE  5 mg Intravenous Q6H  . losartan  50 mg Oral BID  . nicotine  21 mg Transdermal Daily  . pantoprazole  40 mg Oral Daily  . rOPINIRole  4 mg Oral QHS   Continuous Infusions:    LOS: 3 days    Time spent: 30 minutes    Loretha Stapler, MD [facility] Pager [phone]  If 7PM-7AM, please contact night-coverage [email or web address] Password [email or web address] 05/22/2017, 11:42 AM

## 2017-05-22 NOTE — Progress Notes (Signed)
BP stabilizing. PT reports pain is a lot better and headache as since subsided. Continue to monitor. Pt verbalizes she rested well and feels better.

## 2017-05-22 NOTE — NC FL2 (Signed)
[place] LEVEL OF CARE SCREENING TOOL     IDENTIFICATION  Patient Name: Katrina [name] Birthdate: 17-Apr-1961 Sex: female Admission Date (Current Location): 05/16/2017  [facility] and [place] Number:  [employer] and Address:  [place] [address], [place]      Provider Number: [id number]  Attending Physician Name and Address:  Eber Jones, MD  Relative Name and Phone Number:  [name] [phone]    Current Level of Care: Hospital Recommended Level of Care: [place] Prior Approval Number:    Date Approved/Denied:   PASRR Number:    Discharge Plan: SNF    Current Diagnoses: Patient Active Problem List   Diagnosis Date Noted  . Weakness of left leg 05/19/2017  . Hypertension 12/03/2016  . Adverse food reaction 10/14/2016  . Pulmonary nodules 10/14/2016  . Non-seasonal allergic rhinitis due to fungal spores 10/14/2016  . Cigarette smoker 09/24/2016  . Cerebral microvascular disease 08/19/2016  . Non compliance with medical treatment 05/11/2016  . Leg pain, anterior, right 05/11/2016  . Syncope 05/02/2016  . Type 2 diabetes mellitus with diabetic neuropathy ([place]) 05/02/2016  . Restless leg syndrome 05/02/2016  . History of CVA with residual deficit   . Vascular headache   . History of chest pain   . Generalized anxiety disorder   . Chronic bilateral low back pain without sciatica   . Peripheral neuropathy   . Migraine without status migrainosus, not intractable   . PTSD (post-traumatic stress disorder)   . History of cerebrovascular accident (CVA) with residual deficit   . CVA (cerebral vascular accident) ([place]) 04/03/2016  . Stroke (cerebrum) ([place]) 04/03/2016  . Post traumatic stress disorder 10/06/2015  . Precordial pain   . Migraine syndrome 01/24/2015  . Migraine 01/24/2015  . TIA (transient ischemic attack) 12/25/2014  . Hypertensive urgency   . Atypical chest pain    . Malignant hypertension   . Hypokalemia 04/16/2014  . Muscle weakness (generalized) 04/05/2014  . Stiffness of left hip joint 04/05/2014  . Pain in left hip 04/05/2014  . Diastolic dysfunction 42/35/3614  . Normal coronary arteries 03/20/2014  . PUD (peptic ulcer disease) 03/20/2014  . Type 2 diabetes, uncontrolled, with neuropathy ([place]) 03/20/2014  . Morbid obesity due to excess calories ([place]) 03/20/2014  . Left-sided weakness 03/20/2014  . Paresthesias 03/20/2014  . Cerebrovascular disease 03/20/2014  . Dyspnea on exertion 03/05/2012  . Tobacco abuse 03/05/2012  . Moderate COPD (chronic obstructive pulmonary disease) ([place]) 11/23/2011  . GERD (gastroesophageal reflux disease) 11/23/2011  . Uncontrolled hypertension 11/23/2011    Orientation RESPIRATION BLADDER Height & Weight     Self, Time, Situation, Place  Normal Continent Weight: 216 lb 7.9 oz (98.2 kg) Height:  5\' 6"  (167.6 cm)  BEHAVIORAL SYMPTOMS/MOOD NEUROLOGICAL BOWEL NUTRITION STATUS  (NA) (NA) Continent (NA)  AMBULATORY STATUS COMMUNICATION OF NEEDS Skin   Extensive Assist Verbally Normal                       Personal Care Assistance Level of Assistance  Bathing, Dressing Bathing Assistance: Maximum assistance   Dressing Assistance: Maximum assistance     Functional Limitations Info  (NA)          SPECIAL CARE FACTORS FREQUENCY  PT (By licensed PT)     PT Frequency: 7x weekly              Contractures Contractures Info: Present    Additional  Factors Info  Code Status, Allergies Code Status Info: DNR Allergies Info: Penicillin, zithromax           Current Medications (05/22/2017):  This is the current hospital active medication list Current Facility-Administered Medications  Medication Dose Route Frequency Provider Last Rate Last Dose  .  stroke: mapping our early stages of recovery book   Does not apply Once Opyd, Ilene Qua, MD      . acetaminophen (TYLENOL) tablet 650 mg   650 mg Oral Q4H PRN Opyd, Ilene Qua, MD       Or  . acetaminophen (TYLENOL) solution 650 mg  650 mg Per Tube Q4H PRN Opyd, Ilene Qua, MD       Or  . acetaminophen (TYLENOL) suppository 650 mg  650 mg Rectal Q4H PRN Opyd, Ilene Qua, MD      . albuterol (PROVENTIL) (2.5 MG/3ML) 0.083% nebulizer solution 3 mL  3 mL Inhalation Q6H PRN Opyd, Ilene Qua, MD      . aspirin chewable tablet 324 mg  324 mg Oral Once Rancour, Stephen, MD      . atenolol (TENORMIN) tablet 50 mg  50 mg Oral Daily Eber Jones, MD   50 mg at 05/22/17 1026  . butalbital-acetaminophen-caffeine (FIORICET, ESGIC) 50-325-40 MG per tablet 2 tablet  2 tablet Oral Q4H PRN Phillips Odor, MD   2 tablet at 05/20/17 1010  . chlorthalidone (HYGROTON) tablet 25 mg  25 mg Oral BID Oswald Hillock, MD   25 mg at 05/22/17 1026  . cyanocobalamin ((VITAMIN B-12)) injection 1,000 mcg  1,000 mcg Intramuscular Once Phillips Odor, MD      . diphenhydrAMINE (BENADRYL) injection 12.5 mg  12.5 mg Intravenous Q8H PRN Eber Jones, MD   12.5 mg at 05/22/17 0002  . gabapentin (NEURONTIN) capsule 600 mg  600 mg Oral QID Opyd, Ilene Qua, MD   600 mg at 05/22/17 1317  . hydrALAZINE (APRESOLINE) injection 5 mg  5 mg Intravenous Q6H Eber Jones, MD   5 mg at 05/22/17 9024  . HYDROmorphone (DILAUDID) injection 0.5 mg  0.5 mg Intravenous Q3H PRN Eber Jones, MD   0.5 mg at 05/22/17 1330  . labetalol (NORMODYNE,TRANDATE) injection 5-10 mg  5-10 mg Intravenous Q2H PRN Opyd, Ilene Qua, MD   5 mg at 05/21/17 1559  . LORazepam (ATIVAN) tablet 1 mg  1 mg Oral Q6H PRN Opyd, Ilene Qua, MD   1 mg at 05/21/17 2215  . losartan (COZAAR) tablet 50 mg  50 mg Oral BID Oswald Hillock, MD   50 mg at 05/22/17 1026  . methocarbamol (ROBAXIN) tablet 500 mg  500 mg Oral Q6H PRN Opyd, Ilene Qua, MD   500 mg at 05/19/17 1022  . nicotine (NICODERM CQ - dosed in mg/24 hours) patch 21 mg  21 mg Transdermal Daily Eber Jones, MD   21 mg at  05/22/17 1031  . ondansetron (ZOFRAN) injection 4 mg  4 mg Intravenous Q6H PRN Eber Jones, MD   4 mg at 05/21/17 1303  . pantoprazole (PROTONIX) EC tablet 40 mg  40 mg Oral Daily Opyd, Ilene Qua, MD   40 mg at 05/22/17 1026  . prochlorperazine (COMPAZINE) tablet 5 mg  5 mg Oral Q8H PRN Eber Jones, MD   5 mg at 05/21/17 2216  . rOPINIRole (REQUIP) tablet 4 mg  4 mg Oral QHS Opyd, Ilene Qua, MD   4 mg at 05/21/17 2215  . senna-docusate (Senokot-S)  tablet 1 tablet  1 tablet Oral QHS PRN Opyd, Ilene Qua, MD      . zolpidem (AMBIEN) tablet 5 mg  5 mg Oral QHS PRN Opyd, Ilene Qua, MD   5 mg at 05/22/17 0026     Discharge Medications: Please see discharge summary for a list of discharge medications.  Relevant Imaging Results:  Relevant Lab Results:   Additional Information Wheel chair and walker  Carolin Sicks, [employer]

## 2017-05-22 NOTE — Progress Notes (Signed)
Physical Therapy Treatment Patient Details Name: Katrina [name] MRN: [id number] DOB: 08-20-1960 Today's Date: 05/22/2017    History of Present Illness Katrina [name] is a 56 y.o. female with medical history significant for history of CVA with residual deficits, accelerated hypertension, and COPD, now presenting to the emergency department with numerous complaints, including intermittent chest pain, headache, and left-sided weakness.  Patient reports recurrent, intermittent pain in the left chest since yesterday, lasting approximately 1 minute at a time without radiation, and without alleviating or exacerbating factors identified.  She also reports some left-sided weakness, but has difficulty determining the time of onset, noting that some of it may be chronic and it seems to wax and wane.  Patient was suffering from a headache that developed gradually several hours ago, eased-off spontaneously, now returns. The headache is moderate in intensity, dull in character, localized to behind the left eye, similar to her chronic headaches, and without alleviating or exacerbating factors.  No fevers or chills and no significant dyspnea or cough.    PT Comments    Patient demonstrates increased usage of LUE for holding onto RW during gait training and increased endurance for gait distance.  Patient will benefit from SNF rehab with focus on stair training due to fall risk when attempting to go up down stairs at her home and having to live in basement. PLAN: attempt stair training next visit if patient agrees.  Patient will benefit from continued physical therapy in hospital and recommended venue below to increase strength, balance, endurance for safe ADLs and gait.   Follow Up Recommendations  SNF     Equipment Recommendations  Other (comment)(AFO for left foot drop)    Recommendations for Other Services       Precautions / Restrictions Precautions Precautions: Fall Restrictions Weight Bearing  Restrictions: No    Mobility  Bed Mobility               General bed mobility comments: Patient presents seated in chair  Transfers Overall transfer level: Needs assistance Equipment used: Rolling walker (2 wheeled) Transfers: Sit to/from Stand;Stand Pivot Transfers Sit to Stand: Min guard Stand pivot transfers: Min guard       General transfer comment: slow labored movement  Ambulation/Gait Ambulation/Gait assistance: Min guard Ambulation Distance (Feet): 45 Feet Assistive device: Rolling walker (2 wheeled) Gait Pattern/deviations: Decreased step length - left;Decreased stance time - left;Decreased dorsiflexion - left;Decreased weight shift to left   Gait velocity interpretation: Below normal speed for age/gender General Gait Details: demonstrates slow labored cadence with increased usage of LUE for holding onto RW, continues to drag left foot without loss of balance, limited secondary to c/o fatigue.   Stairs            Wheelchair Mobility    Modified Rankin (Stroke Patients Only)       Balance Overall balance assessment: Needs assistance Sitting-balance support: No upper extremity supported;Feet supported Sitting balance-Leahy Scale: Good     Standing balance support: Bilateral upper extremity supported;During functional activity Standing balance-Leahy Scale: Fair Standing balance comment: using RW                            Cognition Arousal/Alertness: Awake/alert Behavior During Therapy: WFL for tasks assessed/performed Overall Cognitive Status: Within Functional Limits for tasks assessed  Exercises General Exercises - Lower Extremity Long Arc Quad: Seated;AROM;Strengthening;Both;10 reps Hip Flexion/Marching: Seated;AROM;Strengthening;Both;AAROM;10 reps Toe Raises: Seated;AROM;Strengthening;Both;10 reps Heel Raises: AROM;Seated;Strengthening;Right;10 reps    General Comments         Pertinent Vitals/Pain Pain Assessment: No/denies pain    Home Living                      Prior Function            PT Goals (current goals can now be found in the care plan section) Acute Rehab PT Goals Patient Stated Goal: return home after rehab PT Goal Formulation: With patient Time For Goal Achievement: 05/28/17 Potential to Achieve Goals: Good Progress towards PT goals: Progressing toward goals    Frequency    7X/week      PT Plan Current plan remains appropriate    Co-evaluation              AM-PAC PT "6 Clicks" Daily Activity  Outcome Measure  Difficulty turning over in bed (including adjusting bedclothes, sheets and blankets)?: Unable Difficulty moving from lying on back to sitting on the side of the bed? : Unable Difficulty sitting down on and standing up from a chair with arms (e.g., wheelchair, bedside commode, etc,.)?: Unable Help needed moving to and from a bed to chair (including a wheelchair)?: Total Help needed walking in hospital room?: A Little Help needed climbing 3-5 steps with a railing? : Total 6 Click Score: 8    End of Session   Activity Tolerance: Patient tolerated treatment well;Patient limited by fatigue Patient left: in chair;with call bell/phone within reach Nurse Communication: Mobility status PT Visit Diagnosis: Unsteadiness on feet (R26.81);Other abnormalities of gait and mobility (R26.89);Muscle weakness (generalized) (M62.81)     Time: 6579-0383 PT Time Calculation (min) (ACUTE ONLY): 30 min  Charges:  $Gait Training: 8-22 mins $Therapeutic Exercise: 8-22 mins                    G Codes:       11:16 AM, 2017/06/10 Lonell Grandchild, MPT Physical Therapist with [facility] [phone] office [phone] mobile phone

## 2017-05-22 NOTE — Progress Notes (Addendum)
Pt c/o glitter and halos being seen with left eye. PERRLA performed. Both pupil round and reactive. Continue to monitor. Pt refuses night dose of Losartan and Hygronton stating she only takes both around 0800 daily. Pt BP chronically extremely high from about 4 years. PT only takes 3 BP meds at home. Very upset with all of the meds she is ordered and refuses to take several BP medications due to chronically high BP. C/O migraine, compazine, dilaudid and benadryl administered with relief. Tremors noted in hands. Left sided weakness from previous CVA in 2014.

## 2017-05-22 NOTE — Clinical Social Work Note (Signed)
Clinical Social Work Assessment  Patient Details  Name: Katrina [name] MRN: [id number] Date of Birth: 10/12/1960  Date of referral:  05/22/17               Reason for consult:                   Permission sought to share information with:  Case Manager, Family Supports, Customer service manager Permission granted to share information::  Yes, Verbal Permission Granted  Name::     Katrina [name]   Agency::  SNF'S  Relationship::  Daughter  Contact Information: [phone]    Housing/Transportation Living arrangements for the past 2 months:  [place] of Information:  Patient Patient Interpreter Needed:  None Criminal Activity/Legal Involvement Pertinent to Current Situation/Hospitalization:  No - Comment as needed Significant Relationships:  Adult Children(Daughter) Lives with:  Adult Children Do you feel safe going back to the place where you live?  Yes Need for family participation in patient care:  Patient/Family understands current treatment therapy needs.  Care giving concerns:  Pt does not have any concerns at this time.   Social Worker assessment / plan: CSW conducted phone interview with pt.  Pt is aware of treatment plan waiting on SNF placement.   Employment status:  Disabled (Comment on whether or not currently receiving Disability)(disability) Insurance information:  Managed Care PT Recommendations:  [place] [facility] / Referral to community resources:  [place]  Patient/Family's Response to care:  Patient/Family is realistic regarding therapy needs.  Patient expressed understanding of CSW role and discharge process.   Patient/Family's Understanding of and Emotional Response to Diagnosis, Current Treatment, and Prognosis:  Pt is aware of current diagnosis for SNF placement.  Pt has no questions/concerns about plan or treatment.  Emotional Assessment Appearance: stated age   Attitude/Demeanor/Rapport:   (Cooperative) Affect (typically observed):  Accepting Orientation:  Oriented to Self, Oriented to Place, Oriented to  Time, Oriented to Situation Alcohol / Substance use:  Never Used Psych involvement (Current and /or in the community):  No (Comment)  Discharge Needs  Concerns to be addressed:  Basic Needs Readmission within the last 30 days:  No Current discharge risk:  Physical Impairment Barriers to Discharge:  No Barriers Identified   Carolin Sicks, [place] 05/22/2017, 2:49 PM

## 2017-05-22 NOTE — Progress Notes (Signed)
CSW was consulted by Dr. Nicki Reaper for SNF placement.  CSW completed assessment and full work for pt discharge to SNF.  Reed Breech LCSWA [phone]

## 2017-05-22 NOTE — Progress Notes (Signed)
BP 168/71 pulse 70.  Refused scheduled 5mg  hydralazine stating that bp was low for her and that is why she feels dizzy and has a headache.  Texted Dr Adair Patter

## 2017-05-23 ENCOUNTER — Other Ambulatory Visit: Payer: Self-pay | Admitting: *Deleted

## 2017-05-23 MED ORDER — BUTALBITAL-APAP-CAFFEINE 50-325-40 MG PO TABS: 2.0000 | ORAL_TABLET | ORAL | 0 refills | Status: DC | PRN

## 2017-05-23 MED ORDER — HYDRALAZINE HCL 10 MG PO TABS
10.0000 mg | ORAL_TABLET | Freq: Four times a day (QID) | ORAL | Status: DC
  Administered 2017-05-23: 10 mg via ORAL
  Filled 2017-05-23 (×2): qty 1

## 2017-05-23 MED ORDER — NICOTINE 21 MG/24HR TD PT24: 21.0000 mg | MEDICATED_PATCH | Freq: Every day | TRANSDERMAL | 0 refills | Status: DC

## 2017-05-23 MED ORDER — HYDRALAZINE HCL 10 MG PO TABS: 10.0000 mg | ORAL_TABLET | Freq: Four times a day (QID) | ORAL | Status: DC

## 2017-05-23 NOTE — Progress Notes (Signed)
PT discharged. PT assisted to [facility] by nurse with no s/s of distress or pain. PT IV removed, catheter intact and no s/s of complication. All belongings sent wit PT and daughter. PT in stable, calm and cooperative condition with daughter at side. Transporting via private vehicle with daughter driving to [facility]. [facility] in [place] and informed that Patient was on the way. Report called to receiving nurse by Magdalene Patricia RN during day

## 2017-05-23 NOTE — Clinical Social Work Note (Signed)
Patient states that she has transportation to [facility]. She states that she will contact them to come get them and notify family of her disharge.  LCSW notified facility of discharge.    LCSW signing off.

## 2017-05-23 NOTE — Clinical Social Work Placement (Signed)
   CLINICAL SOCIAL WORK PLACEMENT  NOTE  Date:  05/23/2017  Patient Details  Name: Katrina [name] MRN: [id number] Date of Birth: [birth year]  Clinical Social Work is seeking post-discharge placement for this patient at the [place] level of care (*CSW will initial, date and re-position this form in  chart as items are completed):  Yes   Patient/family provided with [facility] Work Department's list of facilities offering this level of care within the geographic area requested by the patient (or if unable, by the patient's family).  Yes   Patient/family informed of their freedom to choose among providers that offer the needed level of care, that participate in Medicare, Medicaid or managed care program needed by the patient, have an available bed and are willing to accept the patient.  Yes   Patient/family informed of [facility]'s ownership interest in [facility] and [facility], as well as of the fact that they are under no obligation to receive care at these facilities.  PASRR submitted to EDS on 05/22/17     PASRR number received on 05/23/17     Existing PASRR number confirmed on       FL2 transmitted to all facilities in geographic area requested by pt/family on 05/22/17     FL2 transmitted to all facilities within larger geographic area on       Patient informed that his/her managed care company has contracts with or will negotiate with certain facilities, including the following:        Yes   Patient/family informed of bed offers received.  Patient chooses bed at [facility]     Physician recommends and patient chooses bed at      Patient to be transferred to [facility] on 05/23/17.  Patient to be transferred to facility by Patient stated that family will transport to facility.     Patient family notified on 05/23/17 of transfer.  Name of family member notified:  [place] stated that she would notify her family of  discharge.      PHYSICIAN       Additional Comment:    _______________________________________________ Ihor Gully, LCSW 05/23/2017, 4:10 PM

## 2017-05-23 NOTE — Progress Notes (Signed)
Pt discharged in stable condition via wheelchair into the care of her daughter via private vehicle for transport to [facility].  PIV removed intact w/o S&S of complications.  Discharge instructions sent with pt. Report called to Riley Lam at [facility].

## 2017-05-23 NOTE — Discharge Summary (Addendum)
Physician Discharge Summary  Katrina [name] SWF:[id number] DOB: 10/08/1960 DOA: 05/16/2017  PCP: Berkley Harvey, NP  Admit date: 05/16/2017 Discharge date: 05/23/2017  Admitted From: Home Disposition: Skilled nursing facility  Recommendations for Outpatient Follow-up:  1. Follow up with PCP in 1-2 weeks 2. Blood pressure medication titrate per facility 3. Please obtain BMP/CBC in one week 4. Referral to [facility]   Home Health: None Equipment/Devices:AFO for left foot drop   Discharge Condition: Stable CODE STATUS: DNR Diet recommendation: Heart healthy carb modified  Brief/Interim Summary: 56 year old female with a history of gnawing hypertension, COPD, depression, and stroke with left hemiparesis 2016 presented with 1 day history of left-sided chest discomfort underneath her left breast radiating to the shoulder, worsening left-sided weakness on the evening of May 16, 2017, and headache. The patient states that she had left-sided chest discomfort that has been on and off since afternoon of May 16, 2017 with some shortness of breath.The patient states that she chronically has shortness of breath, but denied any nausea, vomiting, presyncope. She denies any fevers, chills, vomiting, diarrhea, abdominal pain, dysuria, hematuria. While walking to the living room around dinnertime, the patient noted worsening of her left upper and left lower extremity weakness. She states there is worse than her usual baseline weakness. She also had associated headache behind her left eye without any visual disturbance, dysarthria, dysesthesia. She continues Symbicort and 1/2 packs/day. She endorses compliance with all her medications. Because of complaints, she presented for further evaluation.  Underwent CVA pathway which included MRI which was of poor quality but showed enhancing lesions concerning for possible multiple sclerosis.  Neurology was consulted.  Patient underwent  lumbar puncture on 07/20/2016, as well as EEG.  Lumbar puncture fluid analysis unrevealing however oligoclonal bands were still pending at time of discharge.  Patient continued to have intermittent weakness as well as dizziness, numbness and tingling of her extremities during her hospitalization.  She was started on high-dose IV steroids for 4 days.  Patient was seen and evaluated by physical therapy who recommended skilled nursing facility placement for rehab.  Patient had had significantly elevated blood pressure throughout her hospitalization and intermittently refuse medications due to of saying that her blood pressure lower than 220 systolic made her feel dizzy, nauseous, weak.  On day of discharge her IV scheduled hydralazine was transferred to PO.  Of note on prior hospitalization patient did have a renal artery ultrasound which did not show any stenosis.  Discharge Diagnoses:  Principal Problem:    Weakness, unclear etiology, possible multiple sclerosis Active Problems:   No acute stroke   Moderate COPD (chronic obstructive pulmonary disease) (HCC)   Uncontrolled hypertension   Type 2 diabetes, uncontrolled, with neuropathy (HCC)   Left-sided weakness   Atypical chest pain   Stroke (cerebrum) (HCC)   History of CVA with residual deficit   Weakness of left leg    Discharge Instructions  Discharge Instructions    AMB Referral to [facility]   Complete by:  As directed    Please assign patient for telephonic nurse to engage for transition of care calls and evaluate for further needed [facility] CM services. For questions please contact:   Janci Minor RN, [facility] Liaison [phone])   Reason for consult:  Post hospital discharge follow up with a Telephonic Care Manager   Diagnoses of:   Diabetes Stroke: Ischemic/TIA     Expected date of contact:  1-3 days (reserved for hospital discharges)  Call MD for:  difficulty breathing, headache or visual disturbances    Complete by:  As directed    Call MD for:  extreme fatigue   Complete by:  As directed    Call MD for:  hives   Complete by:  As directed    Call MD for:  persistant dizziness or light-headedness   Complete by:  As directed    Call MD for:  persistant nausea and vomiting   Complete by:  As directed    Call MD for:  severe uncontrolled pain   Complete by:  As directed    Call MD for:  temperature >100.4   Complete by:  As directed    Diet - low sodium heart healthy   Complete by:  As directed    Discharge instructions   Complete by:  As directed    Monitor blood pressure three times a day at facility Titrate medication for hypertension at facility   Increase activity slowly   Complete by:  As directed      Allergies as of 05/23/2017      Reactions   Other Anaphylaxis, Swelling   [place]   Penicillins Anaphylaxis   Has patient had a PCN reaction causing immediate rash, facial/tongue/throat swelling, SOB or lightheadedness with hypotension: Yes Has patient had a PCN reaction causing severe rash involving mucus membranes or skin necrosis: Yes Has patient had a PCN reaction that required hospitalization Yes Has patient had a PCN reaction occurring within the last 10 years: No If all of the above answers are "NO", then may proceed with Cephalosporin use.   Zithromax [azithromycin] Anaphylaxis   Aspirin Other (See Comments)   Due to stomach ulcers.    Pineapple Rash   Strawberry Extract Rash, Hives   Wheat Bran Other (See Comments)   Sneezing, rhinorrhea   Yeast-related Products Other (See Comments)   Sneezing, rhinorrhea   Aspartame And Phenylalanine Palpitations   Mushroom Extract Complex Rash   Nicardipine Nausea And Vomiting, Other (See Comments)   shaking      Medication List    STOP taking these medications   aspirin-acetaminophen-caffeine 250-250-65 MG tablet Commonly known as:  EXCEDRIN MIGRAINE   celecoxib 200 MG capsule Commonly known as:  CELEBREX    clopidogrel 75 MG tablet Commonly known as:  PLAVIX     TAKE these medications   albuterol 108 (90 Base) MCG/ACT inhaler Commonly known as:  PROVENTIL HFA;VENTOLIN HFA Inhale 2 puffs into the lungs every 6 (six) hours as needed.   amLODipine 10 MG tablet Commonly known as:  NORVASC Take 1 tablet (10 mg total) by mouth daily.   atenolol 50 MG tablet Commonly known as:  TENORMIN Take 1 tablet (50 mg total) by mouth daily.   butalbital-acetaminophen-caffeine 50-325-40 MG tablet Commonly known as:  FIORICET, ESGIC Take 2 tablets by mouth every 4 (four) hours as needed for headache.   chlorthalidone 25 MG tablet Commonly known as:  HYGROTON Take 25 mg by mouth 2 (two) times daily.   dexlansoprazole 60 MG capsule Commonly known as:  DEXILANT Take 1 tab by mouth every morning. What changed:    how much to take  how to take this  when to take this  additional instructions   furosemide 20 MG tablet Commonly known as:  LASIX Take 1 tablet (20 mg total) by mouth daily.   gabapentin 600 MG tablet Commonly known as:  NEURONTIN Take 600 mg by mouth 4 (four) times daily.   hydrALAZINE  10 MG tablet Commonly known as:  APRESOLINE Take 1 tablet (10 mg total) by mouth every 6 (six) hours. What changed:    medication strength  how much to take  when to take this   losartan 50 MG tablet Commonly known as:  COZAAR Take 50 mg by mouth 2 (two) times daily.   methocarbamol 500 MG tablet Commonly known as:  ROBAXIN Take 1 tablet by mouth every 6 (six) hours as needed for muscle spasms.   potassium chloride SA 20 MEQ tablet Commonly known as:  K-DUR,KLOR-CON Take 1 tablet (20 mEq total) by mouth daily.   rOPINIRole 4 MG tablet Commonly known as:  REQUIP Take 4 mg by mouth at bedtime.   spironolactone 100 MG tablet Commonly known as:  ALDACTONE Take 100 mg by mouth daily.   zolpidem 12.5 MG CR tablet Commonly known as:  AMBIEN CR Take 12.5 mg by mouth at  bedtime.      Contact information for after-discharge care    [facility] .   Service:  Skilled Nursing Contact information: [address] [address] [phone]             Allergies  Allergen Reactions  . Other Anaphylaxis and Swelling    [place]  . Penicillins Anaphylaxis    Has patient had a PCN reaction causing immediate rash, facial/tongue/throat swelling, SOB or lightheadedness with hypotension: Yes Has patient had a PCN reaction causing severe rash involving mucus membranes or skin necrosis: Yes Has patient had a PCN reaction that required hospitalization Yes Has patient had a PCN reaction occurring within the last 10 years: No If all of the above answers are "NO", then may proceed with Cephalosporin use.   . Zithromax [Azithromycin] Anaphylaxis  . Aspirin Other (See Comments)    Due to stomach ulcers.   . Pineapple Rash  . Strawberry Extract Rash and Hives  . Wheat Bran Other (See Comments)    Sneezing, rhinorrhea  . Yeast-Related Products Other (See Comments)    Sneezing, rhinorrhea  . Aspartame And Phenylalanine Palpitations  . Mushroom Extract Complex Rash  . Nicardipine Nausea And Vomiting and Other (See Comments)    shaking    Consultations:  Neurology  Physical therapy  Speech language pathology  Occupational therapy  Case management Social work   Procedures/Studies: Ct Angio Head W Or Wo Contrast  Result Date: 05/16/2017 CLINICAL DATA:  Sudden onset left-sided weakness EXAM: CT ANGIOGRAPHY HEAD AND NECK TECHNIQUE: Multidetector CT imaging of the head and neck was performed using the standard protocol during bolus administration of intravenous contrast. Multiplanar CT image reconstructions and MIPs were obtained to evaluate the vascular anatomy. Carotid stenosis measurements (when applicable) are obtained utilizing NASCET criteria, using the distal internal carotid diameter as the  denominator. CONTRAST:  185mL ISOVUE-370 IOPAMIDOL (ISOVUE-370) INJECTION 76% COMPARISON:  Head CT 05/16/2017 Head CT 04/25/2017 FINDINGS: CTA NECK FINDINGS Aortic arch: There is minimal calcific atherosclerosis of the aortic arch. There is no aneurysm, dissection or hemodynamically significant stenosis of the visualized ascending aorta and aortic arch. Conventional 3 vessel aortic branching pattern. The visualized proximal subclavian arteries are normal. Right carotid system: The right common carotid origin is widely patent. There is no common carotid or internal carotid artery dissection or aneurysm. Atherosclerotic calcification at the carotid bifurcation without hemodynamically significant stenosis. Left carotid system: The left common carotid origin is widely patent. There is no common carotid or internal carotid artery dissection  or aneurysm. Atherosclerotic calcification at the carotid bifurcation without hemodynamically significant stenosis. Vertebral arteries: The vertebral system is right dominant. The left vertebral artery is diminutive along its entire course and its origin is not visible. Minimal opacification begins at the C4 level. The remainder of the left vertebral artery is severely stenotic, worsened from the CTA obtained 10/01/2015. The right vertebral artery is normal to its confluence with the basilar artery. Skeleton: There is no bony spinal canal stenosis. No lytic or blastic lesions. Other neck: The nasopharynx is clear. The oropharynx and hypopharynx are normal. The epiglottis is normal. The supraglottic larynx, glottis and subglottic larynx are normal. No retropharyngeal collection. The parapharyngeal spaces are preserved. The parotid and submandibular glands are normal. No sialolithiasis or salivary ductal dilatation. The thyroid gland is normal. There is no cervical lymphadenopathy. Upper chest: No pneumothorax or pleural effusion. No nodules or masses. Review of the MIP images confirms  the above findings CTA HEAD FINDINGS Anterior circulation: --Intracranial internal carotid arteries: Normal. --Anterior cerebral arteries: Normal. --Middle cerebral arteries: Patent. Mild atherosclerotic narrowing of the right middle cerebral artery without hemodynamically significant stenosis. Normal left MCA. --Posterior communicating arteries: Present on the right, absent on the left. Posterior circulation: --Posterior cerebral arteries: Normal. --Superior cerebellar arteries: Normal. --Basilar artery: Normal. --Anterior inferior cerebellar arteries: Normal. --Posterior inferior cerebellar arteries: Normal. Venous sinuses: As permitted by contrast timing, patent. Anatomic variants: None Delayed phase: No parenchymal contrast enhancement. Review of the MIP images confirms the above findings IMPRESSION: 1. No emergent intracranial large vessel occlusion. 2. 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 This is probably a chronic finding and would not account for the reported left-sided weakness. The intracranial posterior circulation is entirely patent. 3. Mild bilateral carotid bifurcation atherosclerotic calcification without hemodynamically significant stenosis. Aortic Atherosclerosis (ICD10-I70.0). These results were called by telephone at the time of interpretation on 05/16/2017 at 10:13 pm to Dr. Criss Rosales, who verbally acknowledged these results. Electronically Signed   By: Ulyses Jarred M.D.   On: 05/16/2017 22:13   Dg Chest 1 View  Result Date: 05/16/2017 CLINICAL DATA:  Chest pain EXAM: CHEST 1 VIEW COMPARISON:  04/17/2017 FINDINGS: The heart size and mediastinal contours are within normal limits. Both lungs are clear. The visualized skeletal structures are unremarkable. IMPRESSION: No active disease. Electronically Signed   By: Inez Catalina M.D.   On: 05/16/2017 21:53   Dg Lumbar Spine Complete  Result Date: 04/25/2017 CLINICAL  DATA:  Initial evaluation for acute trauma, fall. EXAM: LUMBAR SPINE - COMPLETE 4+ VIEW COMPARISON:  Prior radiograph from 05/06/2016. FINDINGS: Five non rib-bearing lumbar type vertebral bodies. Vertebral bodies normally aligned with preservation of the normal lumbar lordosis. No listhesis. Patient is status post PLIF at L5-S1. Hardware intact without complication. No acute fracture. Visualized sacrum intact. Aortic atherosclerosis.  Cholecystectomy clips noted. IMPRESSION: 1. No radiographic evidence for acute abnormality within the lumbar spine. 2. Sequelae of prior PLIF at L5-S1 without hardware complication. 3. Aortic atherosclerosis. Electronically Signed   By: Jeannine Boga M.D.   On: 04/25/2017 01:43   Dg Shoulder Right  Result Date: 04/25/2017 CLINICAL DATA:  Initial evaluation for acute trauma, fall. EXAM: RIGHT SHOULDER - 2+ VIEW COMPARISON:  Prior MRI from 11/17/2016. FINDINGS: There is no evidence of fracture or dislocation. There is no evidence of arthropathy or other focal bone abnormality. Soft tissues are unremarkable. IMPRESSION: No acute osseous abnormality about the right shoulder. Electronically Signed   By: Marland Kitchen  Jeannine Boga M.D.   On: 04/25/2017 01:33   Ct Head Wo Contrast  Result Date: 05/20/2017 CLINICAL DATA:  Subacute onset of altered level of consciousness. Initial encounter. EXAM: CT HEAD WITHOUT CONTRAST TECHNIQUE: Contiguous axial images were obtained from the base of the skull through the vertex without intravenous contrast. COMPARISON:  CT of the head performed 05/16/2017, and MRI of the brain performed 05/18/2017 FINDINGS: Brain: No evidence of acute infarction, hemorrhage, hydrocephalus, extra-axial collection or mass lesion/mass effect. The posterior fossa, including the cerebellum, brainstem and fourth ventricle, is within normal limits. The third and lateral ventricles, and basal ganglia are unremarkable in appearance. The cerebral hemispheres are symmetric  in appearance, with normal gray-white differentiation. No mass effect or midline shift is seen. Vascular: No hyperdense vessel or unexpected calcification. Skull: There is no evidence of fracture; visualized osseous structures are unremarkable in appearance. Sinuses/Orbits: The orbits are within normal limits. The paranasal sinuses and mastoid air cells are well-aerated. Other: No significant soft tissue abnormalities are seen. IMPRESSION: Unremarkable noncontrast CT of the head. Electronically Signed   By: Garald Balding M.D.   On: 05/20/2017 00:14   Ct Head Wo Contrast  Result Date: 04/25/2017 CLINICAL DATA:  56 y/o F; fall down stairs with neck pain, weakness, dizziness, headache. EXAM: CT HEAD WITHOUT CONTRAST CT CERVICAL SPINE WITHOUT CONTRAST TECHNIQUE: Multidetector CT imaging of the head and cervical spine was performed following the standard protocol without intravenous contrast. Multiplanar CT image reconstructions of the cervical spine were also generated. COMPARISON:  08/25/2016 and 07/18/2014 CT of head and cervical spine. FINDINGS: CT HEAD FINDINGS Brain: No evidence of acute infarction, hemorrhage, hydrocephalus, extra-axial collection or mass lesion/mass effect. Stable mild chronic microvascular ischemic changes of the brain. Vascular: Mild calcific atherosclerosis of carotid siphons. Skull: Normal. Negative for fracture or focal lesion. Sinuses/Orbits: No acute finding. Other: None. CT CERVICAL SPINE FINDINGS Alignment: Straightening of cervical lordosis. Skull base and vertebrae: No acute fracture. No primary bone lesion or focal pathologic process. Soft tissues and spinal canal: No prevertebral fluid or swelling. No visible canal hematoma. Disc levels: Uncovertebral and facet hypertrophy with mild discogenic degenerative changes at the C5-C7 levels. Uncovertebral hypertrophy results in mild left foraminal encroachment at C6-7 and right foraminal encroachment at C5-C7. No significant bony  canal stenosis. Upper chest: Negative. Other: Calcific atherosclerosis of the carotid bifurcations bilaterally. Brief retropharyngeal course of right proximal ICA. IMPRESSION: 1. No acute intracranial abnormality or calvarial fracture. 2. No acute fracture or dislocation of cervical spine. 3. Stable mild chronic microvascular ischemic changes of the brain. 4. Stable mild cervical spine spondylosis greatest at C5-C7 levels. Electronically Signed   By: Kristine Garbe M.D.   On: 04/25/2017 01:49   Ct Angio Neck W And/or Wo Contrast  Result Date: 05/16/2017 CLINICAL DATA:  Sudden onset left-sided weakness EXAM: CT ANGIOGRAPHY HEAD AND NECK TECHNIQUE: Multidetector CT imaging of the head and neck was performed using the standard protocol during bolus administration of intravenous contrast. Multiplanar CT image reconstructions and MIPs were obtained to evaluate the vascular anatomy. Carotid stenosis measurements (when applicable) are obtained utilizing NASCET criteria, using the distal internal carotid diameter as the denominator. CONTRAST:  191mL ISOVUE-370 IOPAMIDOL (ISOVUE-370) INJECTION 76% COMPARISON:  Head CT 05/16/2017 Head CT 04/25/2017 FINDINGS: CTA NECK FINDINGS Aortic arch: There is minimal calcific atherosclerosis of the aortic arch. There is no aneurysm, dissection or hemodynamically significant stenosis of the visualized ascending aorta and aortic arch. Conventional 3 vessel aortic branching pattern. The visualized proximal subclavian  arteries are normal. Right carotid system: The right common carotid origin is widely patent. There is no common carotid or internal carotid artery dissection or aneurysm. Atherosclerotic calcification at the carotid bifurcation without hemodynamically significant stenosis. Left carotid system: The left common carotid origin is widely patent. There is no common carotid or internal carotid artery dissection or aneurysm. Atherosclerotic calcification at the carotid  bifurcation without hemodynamically significant stenosis. Vertebral arteries: The vertebral system is right dominant. The left vertebral artery is diminutive along its entire course and its origin is not visible. Minimal opacification begins at the C4 level. The remainder of the left vertebral artery is severely stenotic, worsened from the CTA obtained 10/01/2015. The right vertebral artery is normal to its confluence with the basilar artery. Skeleton: There is no bony spinal canal stenosis. No lytic or blastic lesions. Other neck: The nasopharynx is clear. The oropharynx and hypopharynx are normal. The epiglottis is normal. The supraglottic larynx, glottis and subglottic larynx are normal. No retropharyngeal collection. The parapharyngeal spaces are preserved. The parotid and submandibular glands are normal. No sialolithiasis or salivary ductal dilatation. The thyroid gland is normal. There is no cervical lymphadenopathy. Upper chest: No pneumothorax or pleural effusion. No nodules or masses. Review of the MIP images confirms the above findings CTA HEAD FINDINGS Anterior circulation: --Intracranial internal carotid arteries: Normal. --Anterior cerebral arteries: Normal. --Middle cerebral arteries: Patent. Mild atherosclerotic narrowing of the right middle cerebral artery without hemodynamically significant stenosis. Normal left MCA. --Posterior communicating arteries: Present on the right, absent on the left. Posterior circulation: --Posterior cerebral arteries: Normal. --Superior cerebellar arteries: Normal. --Basilar artery: Normal. --Anterior inferior cerebellar arteries: Normal. --Posterior inferior cerebellar arteries: Normal. Venous sinuses: As permitted by contrast timing, patent. Anatomic variants: None Delayed phase: No parenchymal contrast enhancement. Review of the MIP images confirms the above findings IMPRESSION: 1. No emergent intracranial large vessel occlusion. 2. 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 This is probably a chronic finding and would not account for the reported left-sided weakness. The intracranial posterior circulation is entirely patent. 3. Mild bilateral carotid bifurcation atherosclerotic calcification without hemodynamically significant stenosis. Aortic Atherosclerosis (ICD10-I70.0). These results were called by telephone at the time of interpretation on 05/16/2017 at 10:13 pm to Dr. Criss Rosales, who verbally acknowledged these results. Electronically Signed   By: Ulyses Jarred M.D.   On: 05/16/2017 22:13   Ct Cervical Spine Wo Contrast  Result Date: 04/25/2017 CLINICAL DATA:  56 y/o F; fall down stairs with neck pain, weakness, dizziness, headache. EXAM: CT HEAD WITHOUT CONTRAST CT CERVICAL SPINE WITHOUT CONTRAST TECHNIQUE: Multidetector CT imaging of the head and cervical spine was performed following the standard protocol without intravenous contrast. Multiplanar CT image reconstructions of the cervical spine were also generated. COMPARISON:  08/25/2016 and 07/18/2014 CT of head and cervical spine. FINDINGS: CT HEAD FINDINGS Brain: No evidence of acute infarction, hemorrhage, hydrocephalus, extra-axial collection or mass lesion/mass effect. Stable mild chronic microvascular ischemic changes of the brain. Vascular: Mild calcific atherosclerosis of carotid siphons. Skull: Normal. Negative for fracture or focal lesion. Sinuses/Orbits: No acute finding. Other: None. CT CERVICAL SPINE FINDINGS Alignment: Straightening of cervical lordosis. Skull base and vertebrae: No acute fracture. No primary bone lesion or focal pathologic process. Soft tissues and spinal canal: No prevertebral fluid or swelling. No visible canal hematoma. Disc levels: Uncovertebral and facet hypertrophy with mild discogenic degenerative changes at the C5-C7 levels. Uncovertebral hypertrophy results in mild left foraminal encroachment at C6-7  and  right foraminal encroachment at C5-C7. No significant bony canal stenosis. Upper chest: Negative. Other: Calcific atherosclerosis of the carotid bifurcations bilaterally. Brief retropharyngeal course of right proximal ICA. IMPRESSION: 1. No acute intracranial abnormality or calvarial fracture. 2. No acute fracture or dislocation of cervical spine. 3. Stable mild chronic microvascular ischemic changes of the brain. 4. Stable mild cervical spine spondylosis greatest at C5-C7 levels. Electronically Signed   By: Kristine Garbe M.D.   On: 04/25/2017 01:49   Mr Brain Wo Contrast  Result Date: 05/18/2017 CLINICAL DATA:  56 year old female with altered mental status for 2 days. EXAM: MRI HEAD WITHOUT CONTRAST TECHNIQUE: Multiplanar, multiecho pulse sequences of the brain and surrounding structures were obtained without intravenous contrast. COMPARISON:  Brain MRI 06/29/2016 and earlier. CTA head and neck 05/16/2017. FINDINGS: The examination had to be discontinued prior to completion due to claustrophobia. Intermittently motion degraded diffusion-weighted imaging, sagittal T1 weighted imaging, axial T2 and FLAIR imaging was obtained. Brain: Stable cerebral volume. No midline shift, mass effect, or evidence of intracranial mass lesion. No ventriculomegaly. No evidence of acute intracranial hemorrhage or extra-axial collection. No restricted diffusion or evidence of acute infarction. Patchy and scattered chronic bilateral cerebral white matter T2 and FLAIR hyperintensity appears similar to the January MRI. Suggestion of new T2 hyperintensity in the left caudate nucleus near the left frontal horn since January with facilitated diffusion (series 14, image 14). Stable patchy T2 hyperintensity in the left pons. No cortical encephalomalacia identified. Vascular: Major intracranial vascular flow voids are grossly stable since the January MRI. Skull and upper cervical spine: Not well evaluated. Sinuses/Orbits: Appear  stable. Other: Stable trace mastoid fluid. Small volume retained secretions in the nasopharynx today. Stable scalp soft tissues. IMPRESSION: 1. Intermittently motion degraded study which had to be discontinued prior to completion despite repeated imaging attempts. 2.  No acute intracranial abnormality identified. 3. Questionable chronic lacunar infarct in the left caudate which is new since January. Otherwise stable nonspecific cerebral white matter and pontine signal changes. Electronically Signed   By: Genevie Ann M.D.   On: 05/18/2017 08:59   Dg Knee Complete 4 Views Right  Result Date: 04/25/2017 CLINICAL DATA:  Initial evaluation for acute trauma, fall. EXAM: RIGHT KNEE - COMPLETE 4+ VIEW COMPARISON:  None. FINDINGS: No evidence of fracture, dislocation, or joint effusion. No evidence of arthropathy or other focal bone abnormality. Soft tissues are unremarkable. IMPRESSION: No acute osseous abnormality about the right knee. Electronically Signed   By: Jeannine Boga M.D.   On: 04/25/2017 01:40   Dg Hip Unilat W Or Wo Pelvis 2-3 Views Right  Result Date: 04/25/2017 CLINICAL DATA:  Initial evaluation for acute trauma, fall. EXAM: DG HIP (WITH OR WITHOUT PELVIS) 2-3V RIGHT COMPARISON:  Prior radiograph from 05/02/2016. FINDINGS: No acute fracture dislocation. Femoral heads in normal line with the acetabula. Femoral head heights preserved. Bony pelvis intact. SI joints approximated. No acute soft tissue abnormality. Fusion hardware noted within the lower lumbar spine. IMPRESSION: No acute osseous abnormality about the right hip.  The Electronically Signed   By: Jeannine Boga M.D.   On: 04/25/2017 01:35   Ct Head Code Stroke Wo Contrast`  Result Date: 05/16/2017 CLINICAL DATA:  Code stroke.  Sudden onset left-sided weakness EXAM: CT HEAD WITHOUT CONTRAST TECHNIQUE: Contiguous axial images were obtained from the base of the skull through the vertex without intravenous contrast. COMPARISON:   Head CT 04/25/2017 FINDINGS: Brain: No mass lesion or acute hemorrhage. No focal hypoattenuation of the  basal ganglia or cortex to indicate infarcted tissue. No hydrocephalus or age advanced atrophy. Vascular: No hyperdense vessel. No advanced atherosclerotic calcification of the arteries at the skull base. Skull: Normal visualized skull base, calvarium and extracranial soft tissues. Sinuses/Orbits: No sinus fluid levels or advanced mucosal thickening. No mastoid effusion. Normal orbits. ASPECTS [facility] Stroke Program Early CT Score) - Ganglionic level infarction (caudate, lentiform nuclei, internal capsule, insula, M1-M3 cortex): 7 - Supraganglionic infarction (M4-M6 cortex): 3 Total score (0-10 with 10 being normal): 10 IMPRESSION: 1. Normal head CT. 2. ASPECTS is 10. These results were called by telephone at the time of interpretation on 05/16/2017 at 9:16 pm to Dr. Ezequiel Essex , who verbally acknowledged these results. Electronically Signed   By: Ulyses Jarred M.D.   On: 05/16/2017 21:16   Dg Fluoro Guided Loc Of Needle/cath Tip For Spinal Inject Lt  Result Date: 05/19/2017 CLINICAL DATA:  MS. EXAM: DIAGNOSTIC LUMBAR PUNCTURE UNDER FLUOROSCOPIC GUIDANCE FLUOROSCOPY TIME:  Fluoroscopy Time:  0 minutes 48 seconds Radiation Exposure Index (if provided by the fluoroscopic device): 25.9 mGy Number of Acquired Spot Images: 1 PROCEDURE: Informed consent was obtained from the patient prior to the procedure, including potential complications of headache, allergy, and pain. With the patient prone, the lower back was prepped with Betadine. 1% Lidocaine was used for local anesthesia. Lumbar puncture was performed at the L4-L5 level using a 22 gauge needle with return of clear CSF with an opening pressure of 10 cm water. Ten ml of CSF were obtained for laboratory studies. The patient tolerated the procedure well and there were no apparent complications. IMPRESSION: Successful fluoroscopically directed lumbar  puncture. Electronically Signed   By: Marcello Moores  Register   On: 05/19/2017 10:39   Echocardiogram:Left ventricle: The cavity size was normal. Systolic function was   vigorous. The estimated ejection fraction was in the range of 65%   to 70%. Wall motion was normal; there were no regional wall   motion abnormalities. Doppler parameters are consistent with   abnormal left ventricular relaxation (grade 1 diastolic   dysfunction). Doppler parameters are consistent with high   ventricular filling pressure. Mild concentric and mild to   moderate focal basal septal hypertrophy   Subjective: Seen and evaluated.  Of note she has refused 2 of her blood pressure medications today.  Her PASSR number went through when she is ready to discharge to a skilled nursing facility.  Discharge Exam: Vitals:   05/23/17 0832 05/23/17 1225  BP: (!) 183/84 (!) 194/99  Pulse: 72   Resp:    Temp:    SpO2:     Vitals:   05/22/17 1930 05/23/17 0518 05/23/17 0832 05/23/17 1225  BP: (!) 143/73 (!) 151/77 (!) 183/84 (!) 194/99  Pulse: 79 67 72   Resp: 16 16    Temp: 98.7 F (37.1 C) 98.6 F (37 C)    TempSrc: Oral Oral    SpO2: 94% 92%    Weight:      Height:        General: Pt is alert, awake, not in acute distress Cardiovascular: RRR, S1/S2 +, no rubs, no gallops Respiratory: CTA bilaterally, no wheezing, no rhonchi Abdominal: Soft, NT, ND, bowel sounds + Extremities: no edema, no cyanosis    The results of significant diagnostics from this hospitalization (including imaging, microbiology, ancillary and laboratory) are listed below for reference.     Microbiology: Recent Results (from the past 240 hour(s))  MRSA PCR Screening     Status: None  Collection Time: 05/18/17 10:51 AM  Result Value Ref Range Status   MRSA by PCR NEGATIVE NEGATIVE Final    Comment:        The GeneXpert MRSA Assay (FDA approved for NASAL specimens only), is one component of a comprehensive MRSA  colonization surveillance program. It is not intended to diagnose MRSA infection nor to guide or monitor treatment for MRSA infections.      Labs: BNP (last 3 results) Recent Labs    11/27/16 2322 03/20/17 0110 05/16/17 2050  BNP 32.0 43.0 37.1   Basic Metabolic Panel: Recent Labs  Lab 05/16/17 2050 05/16/17 2111  NA 138 141  K 4.0 4.1  CL 105 101  CO2 25  --   GLUCOSE 150* 149*  BUN 25* 23*  CREATININE 1.04* 1.00  CALCIUM 9.1  --    Liver Function Tests: Recent Labs  Lab 05/16/17 2050  AST 18  ALT 14  ALKPHOS 81  BILITOT 0.2*  PROT 7.0  ALBUMIN 3.7   Recent Labs  Lab 05/16/17 2050  LIPASE 26   No results for input(s): AMMONIA in the last 168 hours. CBC: Recent Labs  Lab 05/16/17 2050 05/16/17 2111  WBC 12.9*  --   NEUTROABS 6.1  --   HGB 13.2 13.9  HCT 41.7 41.0  MCV 92.5  --   PLT 281  --    Cardiac Enzymes: Recent Labs  Lab 05/16/17 2050 05/17/17 0207  TROPONINI <0.03 <0.03   BNP: Invalid input(s): POCBNP CBG: Recent Labs  Lab 05/16/17 2102 05/20/17 1700  GLUCAP 145* 164*   D-Dimer No results for input(s): DDIMER in the last 72 hours. Hgb A1c No results for input(s): HGBA1C in the last 72 hours. Lipid Profile No results for input(s): CHOL, HDL, LDLCALC, TRIG, CHOLHDL, LDLDIRECT in the last 72 hours. Thyroid function studies No results for input(s): TSH, T4TOTAL, T3FREE, THYROIDAB in the last 72 hours.  Invalid input(s): FREET3 Anemia work up No results for input(s): VITAMINB12, FOLATE, FERRITIN, TIBC, IRON, RETICCTPCT in the last 72 hours. Urinalysis    Component Value Date/Time   COLORURINE YELLOW 05/21/2017 1704   APPEARANCEUR CLEAR 05/21/2017 1704   LABSPEC 1.010 05/21/2017 1704   PHURINE 7.0 05/21/2017 1704   GLUCOSEU NEGATIVE 05/21/2017 1704   HGBUR MODERATE (A) 05/21/2017 1704   BILIRUBINUR NEGATIVE 05/21/2017 1704   KETONESUR NEGATIVE 05/21/2017 1704   PROTEINUR NEGATIVE 05/21/2017 1704   UROBILINOGEN 0.2  01/23/2015 2255   NITRITE NEGATIVE 05/21/2017 1704   LEUKOCYTESUR NEGATIVE 05/21/2017 1704   Sepsis Labs Invalid input(s): PROCALCITONIN,  WBC,  LACTICIDVEN Microbiology Recent Results (from the past 240 hour(s))  MRSA PCR Screening     Status: None   Collection Time: 05/18/17 10:51 AM  Result Value Ref Range Status   MRSA by PCR NEGATIVE NEGATIVE Final    Comment:        The GeneXpert MRSA Assay (FDA approved for NASAL specimens only), is one component of a comprehensive MRSA colonization surveillance program. It is not intended to diagnose MRSA infection nor to guide or monitor treatment for MRSA infections.      Time coordinating discharge: 45 minutes  SIGNED:   Loretha Stapler, MD  [facility] 05/23/2017, 4:03 PM Pager [phone] If 7PM-7AM, please contact night-coverage [email or web address] Password [email or web address]

## 2017-05-23 NOTE — Progress Notes (Signed)
Physical Therapy Treatment Patient Details Name: Katrina [name] MRN: [id number] DOB: 1960/09/21 Today's Date: 05/23/2017    History of Present Illness Katrina [name] is a 56 y.o. female with medical history significant for history of CVA with residual deficits, accelerated hypertension, and COPD, now presenting to the emergency department with numerous complaints, including intermittent chest pain, headache, and left-sided weakness.  Patient reports recurrent, intermittent pain in the left chest since yesterday, lasting approximately 1 minute at a time without radiation, and without alleviating or exacerbating factors identified.  She also reports some left-sided weakness, but has difficulty determining the time of onset, noting that some of it may be chronic and it seems to wax and wane.  Patient was suffering from a headache that developed gradually several hours ago, eased-off spontaneously, now returns. The headache is moderate in intensity, dull in character, localized to behind the left eye, similar to her chronic headaches, and without alleviating or exacerbating factors.  No fevers or chills and no significant dyspnea or cough.    PT Comments    Patient has difficulty going up/down stairs due to left side weakness/limited hand grip strength to hold onto railing requiring assistance to maintain balance.  Patient will benefit from continued physical therapy in hospital and recommended venue below to increase strength, balance, endurance for safe ADLs and gait.   Follow Up Recommendations  SNF     Equipment Recommendations  Other (comment)(AFO for left ankle foot drop)    Recommendations for Other Services       Precautions / Restrictions Precautions Precautions: Fall Restrictions Weight Bearing Restrictions: No    Mobility  Bed Mobility               General bed mobility comments: Patient presents seated in chair  Transfers Overall transfer level: Needs assistance    Transfers: Sit to/from Stand;Stand Pivot Transfers Sit to Stand: Min guard Stand pivot transfers: Min guard       General transfer comment: slow labored movement  Ambulation/Gait Ambulation/Gait assistance: Min guard Ambulation Distance (Feet): 50 Feet Assistive device: Rolling walker (2 wheeled) Gait Pattern/deviations: Decreased step length - left;Decreased stance time - left;Decreased dorsiflexion - left;Decreased weight shift to left   Gait velocity interpretation: Below normal speed for age/gender General Gait Details: slow labored cadence with increased LUE for using RW, no loss of balance, limited mostly due to c/o fatigue   Stairs Stairs: Yes   Stair Management: One rail Right;Step to pattern Number of Stairs: 6 General stair comments: demonstrates slow labored movement with difficulty clearing left foot when going up steps, required more hands on assistance coming down due to limited grip strength with left hand to hold onto rail  Wheelchair Mobility    Modified Rankin (Stroke Patients Only)       Balance Overall balance assessment: Needs assistance Sitting-balance support: No upper extremity supported;Feet supported Sitting balance-Leahy Scale: Good     Standing balance support: Bilateral upper extremity supported;During functional activity Standing balance-Leahy Scale: Fair Standing balance comment: using RW                            Cognition Arousal/Alertness: Awake/alert Behavior During Therapy: WFL for tasks assessed/performed Overall Cognitive Status: Within Functional Limits for tasks assessed  Exercises General Exercises - Lower Extremity Long Arc Quad: Seated;AROM;Strengthening;Both;10 reps Hip Flexion/Marching: Seated;AROM;Strengthening;Both;AAROM;10 reps Toe Raises: Seated;AROM;Strengthening;Both;10 reps(stretching to left heelcords x 3-4 minute hold) Heel Raises:  AROM;Seated;Strengthening;Right;10 reps    General Comments        Pertinent Vitals/Pain Pain Assessment: No/denies pain    Home Living                      Prior Function            PT Goals (current goals can now be found in the care plan section) Acute Rehab PT Goals Patient Stated Goal: return home after rehab PT Goal Formulation: With patient Time For Goal Achievement: 05/28/17 Potential to Achieve Goals: Good Progress towards PT goals: Progressing toward goals    Frequency    7X/week      PT Plan Current plan remains appropriate    Co-evaluation              AM-PAC PT "6 Clicks" Daily Activity  Outcome Measure  Difficulty turning over in bed (including adjusting bedclothes, sheets and blankets)?: None Difficulty moving from lying on back to sitting on the side of the bed? : None Difficulty sitting down on and standing up from a chair with arms (e.g., wheelchair, bedside commode, etc,.)?: A Little Help needed moving to and from a bed to chair (including a wheelchair)?: A Little Help needed walking in hospital room?: A Little Help needed climbing 3-5 steps with a railing? : A Little 6 Click Score: 20    End of Session Equipment Utilized During Treatment: Gait belt Activity Tolerance: Patient tolerated treatment well;Patient limited by fatigue Patient left: in chair;with call bell/phone within reach Nurse Communication: Mobility status PT Visit Diagnosis: Unsteadiness on feet (R26.81);Other abnormalities of gait and mobility (R26.89);Muscle weakness (generalized) (M62.81)     Time: 5625-6389 PT Time Calculation (min) (ACUTE ONLY): 35 min  Charges:  $Gait Training: 8-22 mins $Therapeutic Exercise: 8-22 mins                    G Codes:       1:20 PM, 27-May-2017 Lonell Grandchild, MPT Physical Therapist with [facility] [phone] office [phone] mobile phone

## 2017-05-23 NOTE — Care Management Important Message (Signed)
Important Message  Patient Details  Name: SANVI EHLER MRN: [id number] Date of Birth: March 09, 1961   Medicare Important Message Given:  Yes    Marta Bouie, Chauncey Reading, RN 05/23/2017, 3:33 PM

## 2017-05-23 NOTE — Patient Outreach (Signed)
[name]) Care Management  05/23/2017  Katrina [name] 1960/08/03 [id number]   Transition of Care  Received referral from [place] on 05/18/17. Patient remained hospitalized until 05/23/17. She was transitioned to a SNF on 05/23/17. Referral being made to [place]:  RN CM will send referral to [facility] SW for transition of care.  Lake Bells, RN, BSN, MHA/MSL, [place] Telephonic Care Manager Coordinator [facility] Direct Phone: [phone] Cell Phone: [phone] Toll Free: [phone] Fax: [phone]

## 2017-05-24 LAB — OLIGOCLONAL BANDS, CSF + SERM

## 2017-06-01 IMAGING — US US EXTREM LOW VENOUS*R*
1 series · 13 of 24 positions shown · non-contrast
Comparison: None.

CLINICAL DATA: Right lower extremity pain, swelling, recent injury/
fall



[Series 1: us extrem low venous*right* · 0.08mm/px · 13 of 36 slices shown]
[im 1/36]
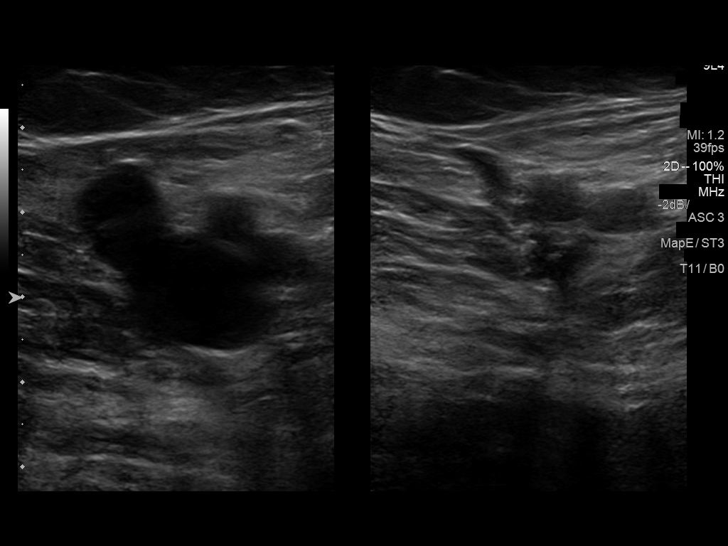
[im 4/36]
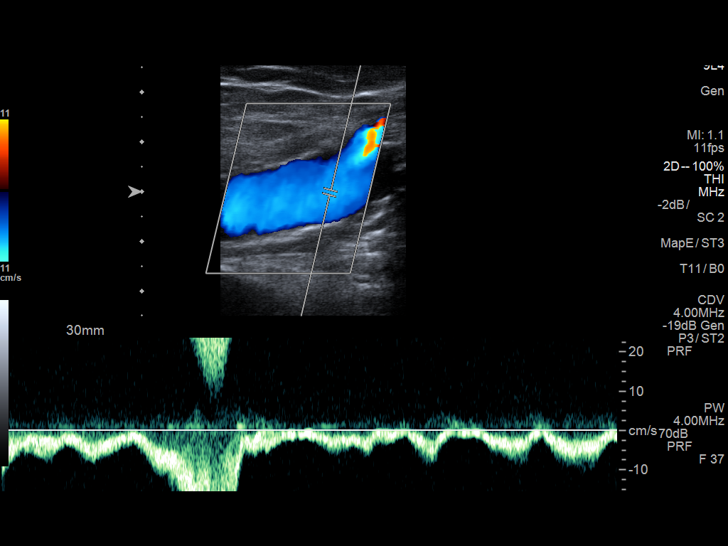
[im 7/36]
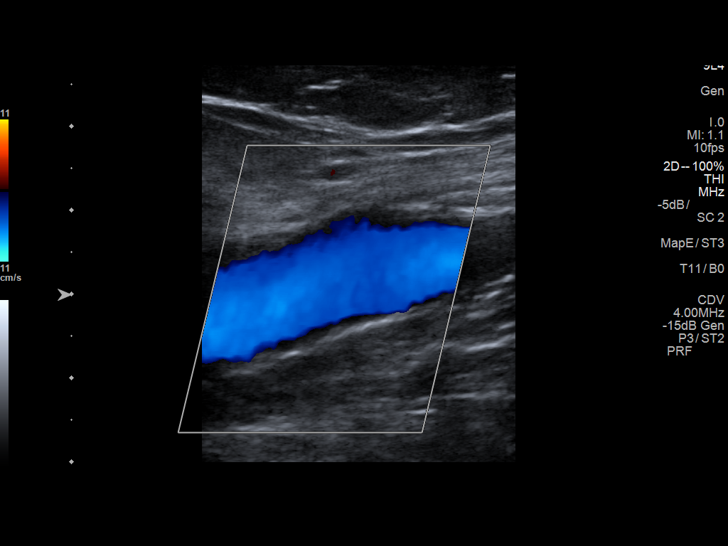
[im 10/36]
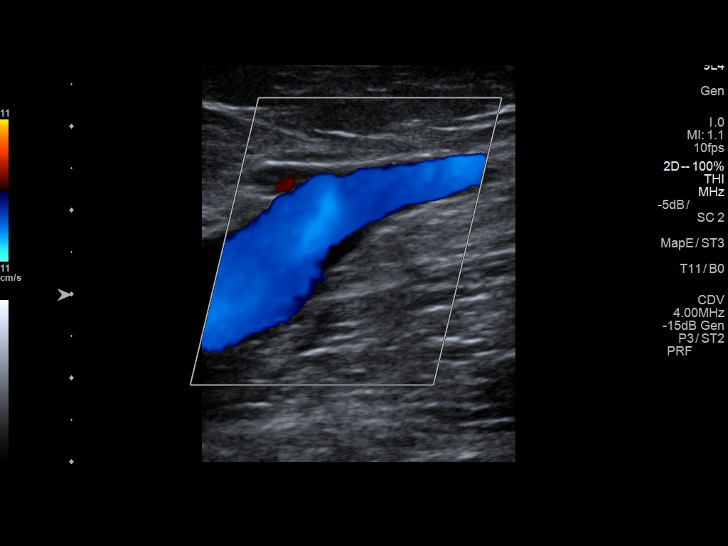
[im 13/36]
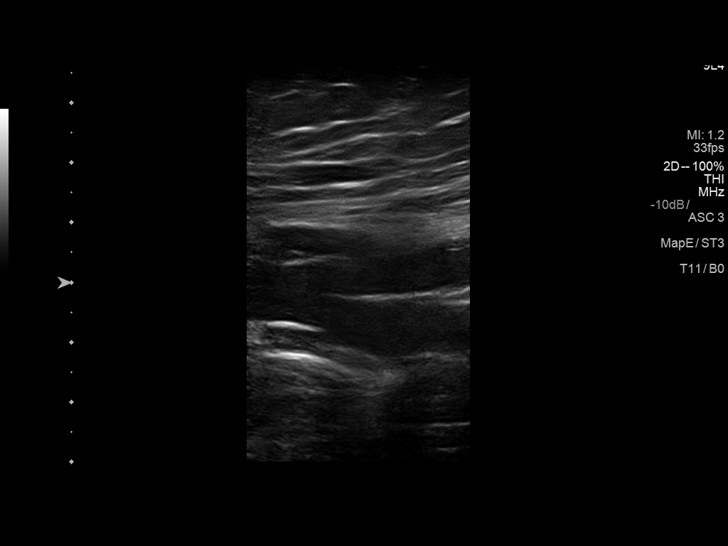
[im 16/36]
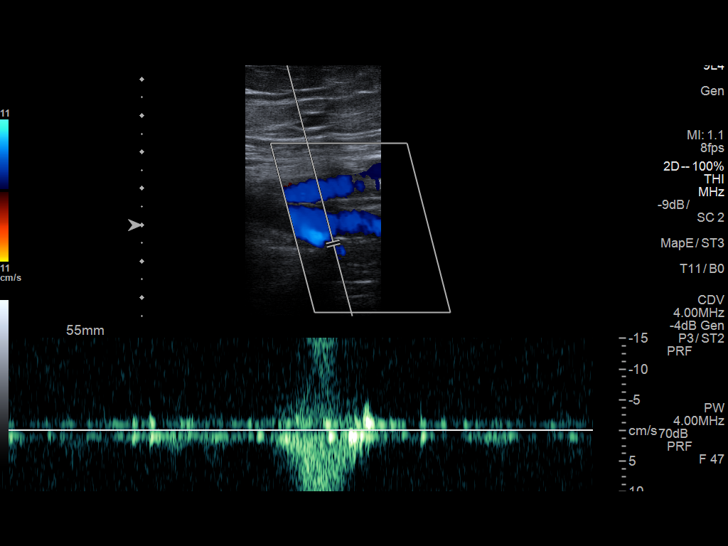
[im 19/36]
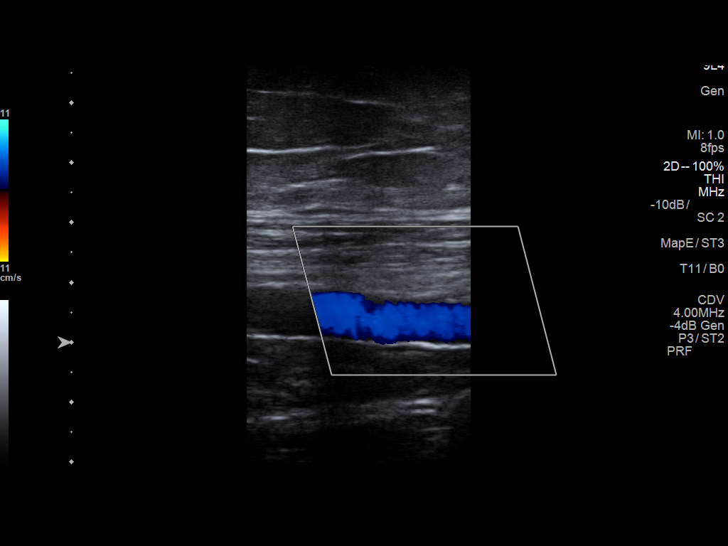
[im 20/36]
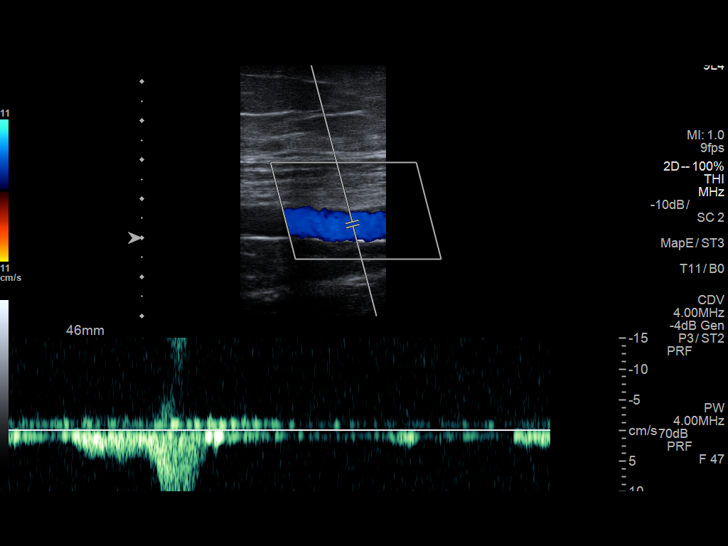
[im 23/36]
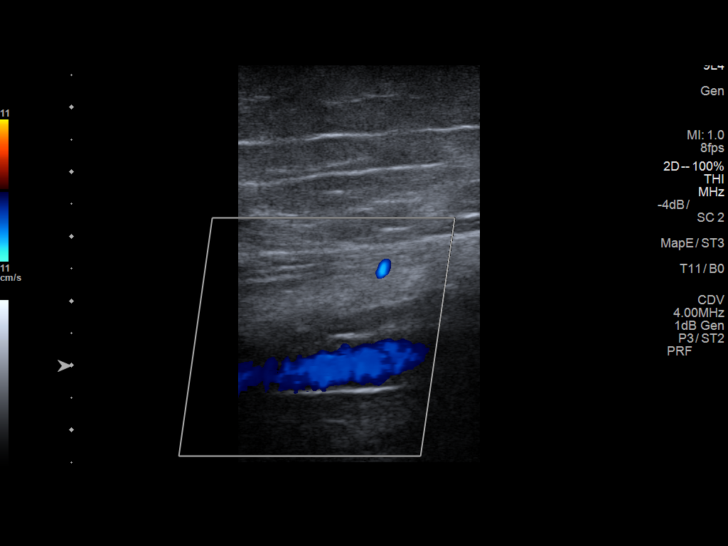
[im 26/36]
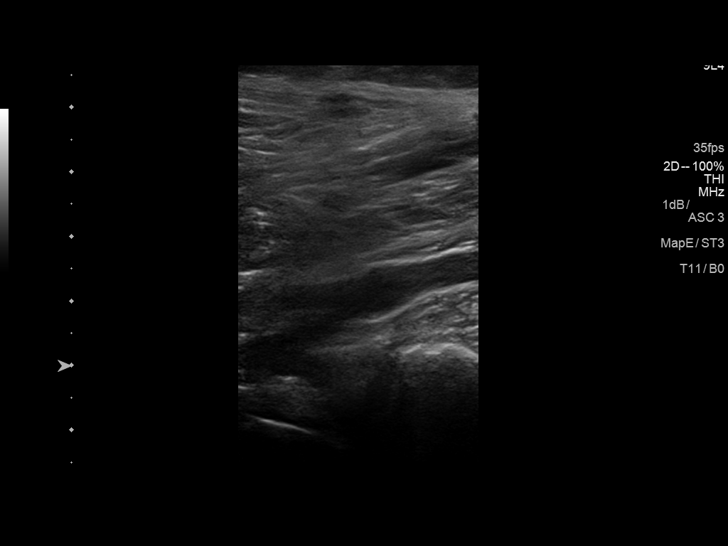
[im 29/36]
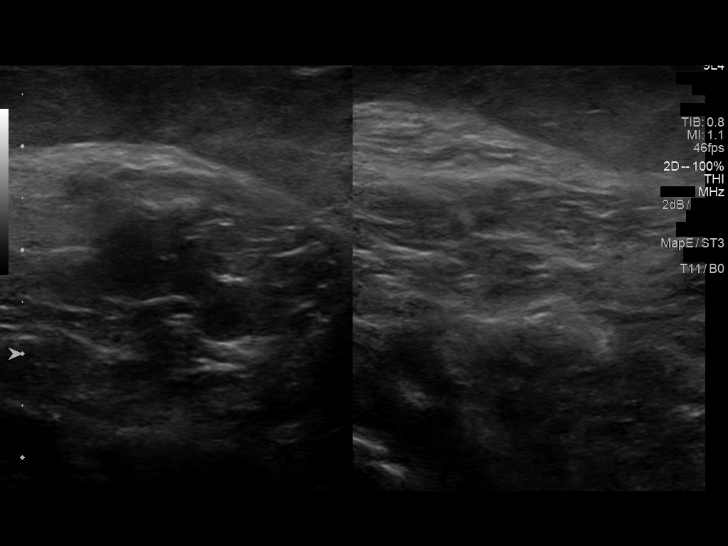
[im 32/36]
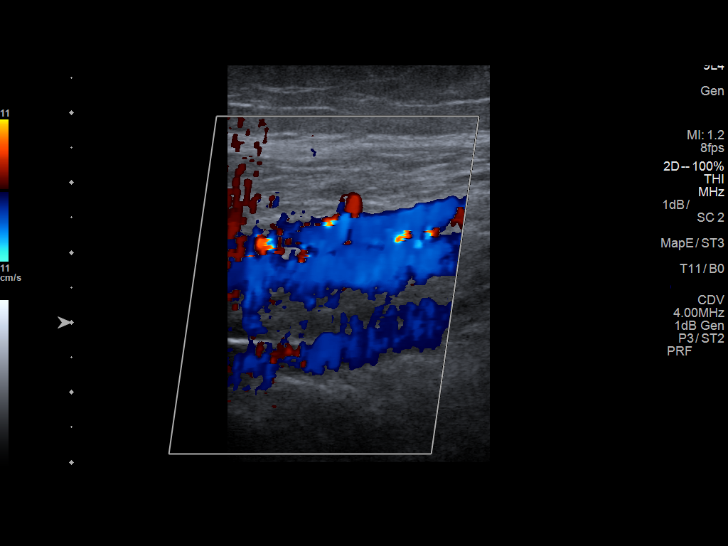
[im 36/36]
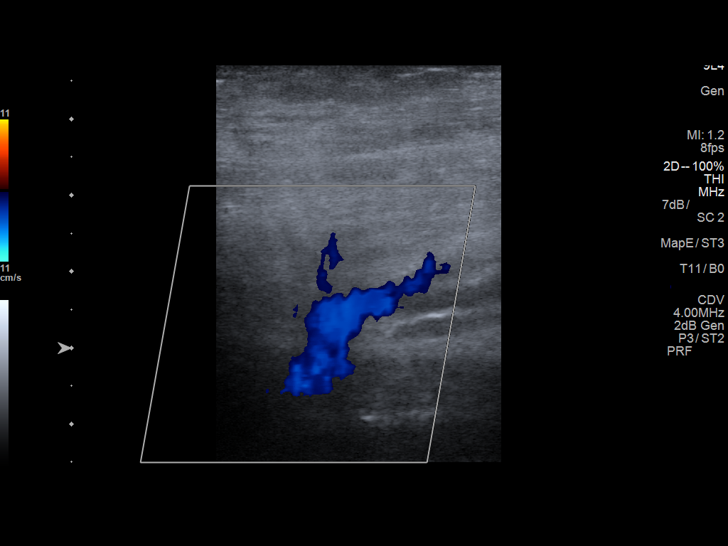

[13 of 24 positions shown; findings below may reference images not displayed]

FINDINGS: Contralateral Common Femoral Vein: Respiratory phasicity is normal
and symmetric with the symptomatic side. No evidence of thrombus.
Normal compressibility.

Common Femoral Vein: No evidence of thrombus. Normal
compressibility, respiratory phasicity and response to augmentation.

Saphenofemoral Junction: No evidence of thrombus. Normal
compressibility and flow on color Doppler imaging.

Profunda Femoral Vein: No evidence of thrombus. Normal
compressibility and flow on color Doppler imaging.

Femoral Vein: No evidence of thrombus. Normal compressibility,
respiratory phasicity and response to augmentation.

Popliteal Vein: No evidence of thrombus. Normal compressibility,
respiratory phasicity and response to augmentation.

Calf Veins: No evidence of thrombus. Normal compressibility and flow
on color Doppler imaging.

Superficial Great Saphenous Vein: No evidence of thrombus. Normal
compressibility and flow on color Doppler imaging.

Venous Reflux:  None.

Other Findings:  None.
IMPRESSION: No evidence of deep venous thrombosis.

## 2017-06-06 ENCOUNTER — Emergency Department (HOSPITAL_COMMUNITY)
Admission: EM | Admit: 2017-06-06 | Discharge: 2017-06-06 | Disposition: A | Discharge status: Home / Self Care | Payer: Medicare Other | Attending: Emergency Medicine | Admitting: Emergency Medicine

## 2017-06-06 ENCOUNTER — Encounter (HOSPITAL_COMMUNITY): Payer: Self-pay | Admitting: Emergency Medicine

## 2017-06-06 ENCOUNTER — Other Ambulatory Visit: Payer: Self-pay

## 2017-06-06 ENCOUNTER — Ambulatory Visit (INDEPENDENT_AMBULATORY_CARE_PROVIDER_SITE_OTHER): Payer: Medicare Other | Admitting: Orthopaedic Surgery

## 2017-06-06 DIAGNOSIS — Z79899 Other long term (current) drug therapy: Secondary | ICD-10-CM | POA: Diagnosis not present

## 2017-06-06 DIAGNOSIS — J45909 Unspecified asthma, uncomplicated: Secondary | ICD-10-CM | POA: Insufficient documentation

## 2017-06-06 DIAGNOSIS — I2510 Atherosclerotic heart disease of native coronary artery without angina pectoris: Secondary | ICD-10-CM | POA: Insufficient documentation

## 2017-06-06 DIAGNOSIS — L02212 Cutaneous abscess of back [any part, except buttock]: Secondary | ICD-10-CM | POA: Insufficient documentation

## 2017-06-06 DIAGNOSIS — N189 Chronic kidney disease, unspecified: Secondary | ICD-10-CM | POA: Insufficient documentation

## 2017-06-06 DIAGNOSIS — E1122 Type 2 diabetes mellitus with diabetic chronic kidney disease: Secondary | ICD-10-CM | POA: Insufficient documentation

## 2017-06-06 DIAGNOSIS — I252 Old myocardial infarction: Secondary | ICD-10-CM | POA: Diagnosis not present

## 2017-06-06 DIAGNOSIS — Z8673 Personal history of transient ischemic attack (TIA), and cerebral infarction without residual deficits: Secondary | ICD-10-CM | POA: Insufficient documentation

## 2017-06-06 DIAGNOSIS — I129 Hypertensive chronic kidney disease with stage 1 through stage 4 chronic kidney disease, or unspecified chronic kidney disease: Secondary | ICD-10-CM | POA: Diagnosis not present

## 2017-06-06 DIAGNOSIS — F17210 Nicotine dependence, cigarettes, uncomplicated: Secondary | ICD-10-CM | POA: Insufficient documentation

## 2017-06-06 DIAGNOSIS — Z7902 Long term (current) use of antithrombotics/antiplatelets: Secondary | ICD-10-CM | POA: Insufficient documentation

## 2017-06-06 DIAGNOSIS — M549 Dorsalgia, unspecified: Secondary | ICD-10-CM | POA: Diagnosis present

## 2017-06-06 DIAGNOSIS — J449 Chronic obstructive pulmonary disease, unspecified: Secondary | ICD-10-CM | POA: Diagnosis not present

## 2017-06-06 DIAGNOSIS — E1140 Type 2 diabetes mellitus with diabetic neuropathy, unspecified: Secondary | ICD-10-CM | POA: Insufficient documentation

## 2017-06-06 MED ORDER — LIDOCAINE HCL (PF) 1 % IJ SOLN: 10.0000 mL | Freq: Once | INTRAMUSCULAR | Status: DC

## 2017-06-06 MED ORDER — LIDOCAINE HCL (PF) 1 % IJ SOLN
INTRAMUSCULAR | Status: DC
Start: 2017-06-06 — End: 2017-06-07
  Filled 2017-06-06: qty 10

## 2017-06-06 MED ORDER — POVIDONE-IODINE 10 % EX SOLN
CUTANEOUS | Status: AC
  Filled 2017-06-06: qty 15

## 2017-06-06 MED ORDER — SULFAMETHOXAZOLE-TRIMETHOPRIM 800-160 MG PO TABS: 1.0000 | ORAL_TABLET | Freq: Two times a day (BID) | ORAL | 0 refills | Status: DC

## 2017-06-06 NOTE — ED Provider Notes (Signed)
[facility] EMERGENCY DEPARTMENT Provider Note   CSN: [id number] Arrival date & time: 06/06/17  3976     History   Chief Complaint Chief Complaint  Patient presents with  . Abscess    HPI Katrina [name] is a 56 y.o. female.  HPI   56 year old female, presents with mid back pain and a swollen area that is tender to palpation.  She has had a similar in the past when she required incision and drainage of an abscess.  She denies fevers, denies diabetes, denies immunosuppression.  Symptoms are gradually worsening over the last couple of days, constant, worse with palpation  Past Medical History:  Diagnosis Date  . Allergy   . Anemia 1975-1976   . Anginal pain ([place])   . Anxiety    takes Valium daily as needed  . Arthritis    "spine" (12/03/2016)  . Asthma    has inhalers but doesn't use (12/03/2016)  . CAD (coronary artery disease)    Moderate nonobstructive 2012-2013, [place]; No angiographic evidence of CAD 01/31/15 [facility]  . Chronic bronchitis ([place])    "get it a couple times q yr" (12/03/2016)  . Chronic kidney disease   . Chronic lower back pain    budlging disc   . Claustrophobia   . COPD (chronic obstructive pulmonary disease) ([place])   . Daily headache   . Depression   . Diverticulitis   . Family history of adverse reaction to anesthesia    2 daughters gets extremely sick   . GERD (gastroesophageal reflux disease)    takes Dexilant daily  . Heart murmur   . History of blood transfusion 1975-1976 "several"   no abnormal reaction noted  . History of colitis   . History of colon polyps    benign  . History of gastric ulcer   . History of hiatal hernia    "small one"  . History of MRSA infection 2017  . Hyperlipidemia    was on meds but has been off over a yr  . Hypertension    takes Amlodipine and Maxzide daily  . Insomnia    takes Ambien nightly  . Iron deficiency anemia    "when I was young"  . Lung nodules   . Microscopic hematuria   . Migraine    "2-3/wk" (12/03/2016)  . MS (multiple sclerosis) ([place])    questionable per pt  . Myocardial infarction ([place]) 2000   "mild one"  . Noncompliance   . Osteoporosis   . Peripheral neuropathy    weakness,numbness,and tingling. Takes Gabapentin daily  . Pneumonia 06/2015  . PTSD (post-traumatic stress disorder) dx'd 2016/2017   "was dx'd w/bipolar in 1991; replaced w/PTSD dx 2016/2017" (12/03/2016)  . Restless leg syndrome    takes Requip daily  . 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 Type II diabetes mellitus ([place])    "went off insulin in 2012/2013" (12/03/2016)    Patient Active Problem List   Diagnosis Date Noted  . Weakness of left leg 05/19/2017  . Hypertension 12/03/2016  . Adverse food reaction 10/14/2016  . Pulmonary nodules 10/14/2016  . Non-seasonal allergic rhinitis due to fungal spores 10/14/2016  . Cigarette smoker 09/24/2016  . Cerebral microvascular disease 08/19/2016  . Non compliance with medical treatment 05/11/2016  . Leg pain, anterior, right 05/11/2016  . Syncope 05/02/2016  . Type 2 diabetes mellitus with diabetic neuropathy ([place]) 05/02/2016  .  Restless leg syndrome 05/02/2016  . History of CVA with residual deficit   . Vascular headache   . History of chest pain   . Generalized anxiety disorder   . Chronic bilateral low back pain without sciatica   . Peripheral neuropathy   . Migraine without status migrainosus, not intractable   . PTSD (post-traumatic stress disorder)   . History of cerebrovascular accident (CVA) with residual deficit   . CVA (cerebral vascular accident) ([place]) 04/03/2016  . Stroke (cerebrum) ([place]) 04/03/2016  . Post traumatic stress disorder 10/06/2015  . Precordial pain   . Migraine syndrome 01/24/2015  . Migraine 01/24/2015  . TIA (transient ischemic attack) 12/25/2014  . Hypertensive urgency   . Atypical chest pain   . Malignant hypertension   .  Hypokalemia 04/16/2014  . Muscle weakness (generalized) 04/05/2014  . Stiffness of left hip joint 04/05/2014  . Pain in left hip 04/05/2014  . Diastolic dysfunction 43/32/9518  . Normal coronary arteries 03/20/2014  . PUD (peptic ulcer disease) 03/20/2014  . Type 2 diabetes, uncontrolled, with neuropathy ([place]) 03/20/2014  . Morbid obesity due to excess calories ([place]) 03/20/2014  . Left-sided weakness 03/20/2014  . Paresthesias 03/20/2014  . Cerebrovascular disease 03/20/2014  . Dyspnea on exertion 03/05/2012  . Tobacco abuse 03/05/2012  . Moderate COPD (chronic obstructive pulmonary disease) ([place]) 11/23/2011  . GERD (gastroesophageal reflux disease) 11/23/2011  . Uncontrolled hypertension 11/23/2011    Past Surgical History:  Procedure Laterality Date  . ABDOMINAL AORTOGRAM N/A 12/03/2016   Procedure: Abdominal Aortogram;  Surgeon: Angelia Mould, MD;  Location: [facility];  Service: Cardiovascular;  Laterality: N/A;  . ABDOMINAL HYSTERECTOMY  12/1987  . ADENOIDECTOMY  1975  . ANKLE SURGERY Bilateral 1993; 1995 X2   "stabilzation done; 1 on the right; 2 on the left"  . APPENDECTOMY  1989  . BACK SURGERY    . BREAST LUMPECTOMY Left    "benign tumor"  . CARDIAC CATHETERIZATION N/A 01/31/2015   Procedure: Left Heart Cath and Coronary Angiography;  Surgeon: Burnell Blanks, MD;  Location: [facility];  Service: Cardiovascular;  Laterality: N/A;  . CARDIAC CATHETERIZATION  2009; 2011  . COLONOSCOPY    . DILATION AND CURETTAGE OF UTERUS    . ESOPHAGOGASTRODUODENOSCOPY    . LAPAROSCOPIC CHOLECYSTECTOMY  1996  . NASAL RECONSTRUCTION  1976  . NASAL SINUS SURGERY  1975  . POSTERIOR LUMBAR FUSION  2005  . RADIOLOGY WITH ANESTHESIA N/A 01/15/2016   Procedure: MRI LUMBAR SPINE WITHOUT;  Surgeon: Medication Radiologist, MD;  Location: [facility];  Service: Radiology;  Laterality: N/A;  . RADIOLOGY WITH ANESTHESIA N/A 06/29/2016   Procedure: MRI OF THE BRAIN WITH AND  WITHOUT;  Surgeon: Medication Radiologist, MD;  Location: [facility];  Service: Radiology;  Laterality: N/A;  . RENAL ANGIOGRAPHY N/A 12/03/2016   Procedure: Renal Angiography;  Surgeon: Angelia Mould, MD;  Location: [facility];  Service: Cardiovascular;  Laterality: N/A;  . TONSILLECTOMY  1992  . TUBAL LIGATION    . 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 Authorizing Provider  albuterol (PROVENTIL HFA;VENTOLIN HFA) 108 (90 Base) MCG/ACT inhaler Inhale 2 puffs into the lungs every 6 (six) hours  as needed. 06/09/16 06/09/17  [provider]  amLODipine (NORVASC) 10 MG tablet Take 1 tablet (10 mg total) by mouth daily. 06/05/14   Herminio Commons, MD  atenolol (TENORMIN) 50 MG tablet Take 1 tablet (50 mg total) by mouth daily. 05/02/17   Herminio Commons, MD  butalbital-acetaminophen-caffeine (FIORICET, ESGIC) [phone] MG tablet Take 2 tablets by mouth every 4 (four) hours as needed for headache. 05/23/17   Eber Jones, MD  chlorthalidone (HYGROTON) 25 MG tablet Take 25 mg by mouth 2 (two) times daily.     [provider]  clopidogrel (PLAVIX) 75 MG tablet Take 75 mg by mouth daily.     [provider]  dexlansoprazole (DEXILANT) 60 MG capsule Take 1 tab by mouth every morning. Patient taking differently: Take 60 mg by mouth every evening.  10/18/16   Esterwood, Amy S, PA-C  furosemide (LASIX) 20 MG tablet Take 1 tablet (20 mg total) by mouth daily. 05/02/17 07/31/17  Herminio Commons, MD  gabapentin (NEURONTIN) 600 MG tablet Take 600 mg by mouth 4 (four) times daily.    [provider]  hydrALAZINE (APRESOLINE) 10 MG tablet Take 1 tablet (10 mg total) by mouth every 6 (six) hours. 05/23/17   Eber Jones, MD  losartan (COZAAR)  50 MG tablet Take 50 mg by mouth 2 (two) times daily.  06/09/16   [provider]  methocarbamol (ROBAXIN) 500 MG tablet Take 1 tablet by mouth every 6 (six) hours as needed for muscle spasms.  05/02/17   [provider]  nicotine (NICODERM CQ - DOSED IN MG/24 HOURS) 21 mg/24hr patch Place 1 patch (21 mg total) onto the skin daily. 05/24/17   Eber Jones, MD  potassium chloride SA (K-DUR,KLOR-CON) 20 MEQ tablet Take 1 tablet (20 mEq total) by mouth daily. 05/02/17   Herminio Commons, MD  rOPINIRole (REQUIP) 4 MG tablet Take 4 mg by mouth at bedtime.    [provider]  spironolactone (ALDACTONE) 100 MG tablet Take 100 mg by mouth daily.    [provider]  sulfamethoxazole-trimethoprim (BACTRIM DS,SEPTRA DS) 800-160 MG tablet Take 1 tablet by mouth 2 (two) times daily for 7 days. 06/06/17 06/13/17  Noemi Chapel, MD  zolpidem (AMBIEN CR) 12.5 MG CR tablet Take 12.5 mg by mouth at bedtime. 05/16/17   [provider]    Family History Family History  Problem Relation Age of Onset  . Coronary artery disease Father   . Emphysema Father   . Heart attack Father   . Stroke Father   . Cancer Father        Unsure of type   . Allergic rhinitis Father   . Depression Mother   . Cancer Mother        skin cancer  . Bipolar disorder Brother   . Drug abuse Brother   . Cancer Brother        leukemia  . Bipolar disorder Daughter   . Allergic rhinitis Daughter   . Asthma Daughter   . Urticaria Daughter   . Diabetes Maternal Grandmother   . Stomach cancer Maternal Grandfather   . Cancer Maternal Grandfather        stomach   . Angioedema Neg Hx   . Atopy Neg Hx   . Eczema Neg Hx   . Immunodeficiency Neg Hx     Social History Social History   Tobacco Use  . Smoking status: Current Every Day Smoker  Packs/day: 2.00    Years: 45.00    Pack years: 90.00    Types: Cigarettes    Start date: 03/04/1971  . Smokeless tobacco: Never Used    Substance Use Topics  . Alcohol use: No    Alcohol/week: 0.0 oz  . Drug use: No     Allergies   Other; Penicillins; Zithromax [azithromycin]; Aspirin; Pineapple; Strawberry extract; Wheat bran; Yeast-related products; Aspartame and phenylalanine; Mushroom extract complex; and Nicardipine   Review of Systems Review of Systems  Constitutional: Negative for chills and fever.  Skin: Positive for rash.     Physical Exam Updated Vital Signs BP (!) 181/88 (BP Location: Left Arm)   Pulse 68   Temp 98.7 F (37.1 C) (Oral)   Resp 18   Ht 5\' 6"  (1.676 m)   Wt 98 kg (216 lb)   SpO2 99%   BMI 34.86 kg/m   Physical Exam  Constitutional: She appears well-developed and well-nourished. No distress.  HENT:  Head: Normocephalic and atraumatic.  Eyes: Conjunctivae are normal. Right eye exhibits no discharge. Left eye exhibits no discharge. No scleral icterus.  Cardiovascular: Normal rate and regular rhythm.  No murmur heard. Pulmonary/Chest: Effort normal and breath sounds normal.  Musculoskeletal: She exhibits tenderness. She exhibits no edema.  Skin: Skin is warm and dry. She is not diaphoretic.  Minimal erythema but area of induration and tenderness to an area just right of the midline in the upper thoracic back.  Nursing note and vitals reviewed.    ED Treatments / Results  Labs (all labs ordered are listed, but only abnormal results are displayed) Labs Reviewed - No data to display   Radiology No results found.  Procedures .Marland KitchenIncision and Drainage Date/Time: 06/06/2017 10:48 PM Performed by: Noemi Chapel, MD Authorized by: Noemi Chapel, MD   Consent:    Consent obtained:  Verbal   Consent given by:  Patient   Risks discussed:  Bleeding, incomplete drainage, pain, damage to other organs and infection   Alternatives discussed:  Alternative treatment Location:    Type:  Abscess   Size:  1 cm   Location:  Trunk   Trunk location:  Back Pre-procedure details:     Skin preparation:  Betadine Anesthesia (see MAR for exact dosages):    Anesthesia method:  Local infiltration   Local anesthetic:  Lidocaine 1% w/o epi Procedure type:    Complexity:  Simple Procedure details:    Needle aspiration: no     Incision types:  Single straight   Incision depth:  Dermal   Scalpel blade:  10   Wound management:  Probed and deloculated, irrigated with saline and extensive cleaning   Drainage:  Purulent   Drainage amount:  Moderate   Wound treatment:  Wound left open   Packing materials:  1/4 in iodoform gauze Post-procedure details:    Patient tolerance of procedure:  Tolerated well, no immediate complications   (including critical care time)  Medications Ordered in ED Medications  lidocaine (PF) (XYLOCAINE) 1 % injection 10 mL (10 mLs Intradermal Handoff 06/06/17 2222)  povidone-iodine (BETADINE) 10 % external solution (  Handoff 06/06/17 2231)     Initial Impression / Assessment and Plan / ED Course  I have reviewed the triage vital signs and the nursing notes.  Pertinent labs & imaging results that were available during my care of the patient were reviewed by me and considered in my medical decision making (see chart for details).     There is  no fluctuance however a bedside ultrasound does reveal an area of about a half a centimeter in diameter pocket of fluid.  Will perform incision and drainage and treat with antibiotics.  Patient otherwise appears stable.  Bactrim Rx nsaids as needed Pt understands return precautions.  Final Clinical Impressions(s) / ED Diagnoses   Final diagnoses:  Abscess of back    ED Discharge Orders        Ordered    sulfamethoxazole-trimethoprim (BACTRIM DS,SEPTRA DS) 800-160 MG tablet  2 times daily     06/06/17 2247       Noemi Chapel, MD 06/06/17 2249

## 2017-06-06 NOTE — ED Notes (Signed)
Dr Miller at bedside. 

## 2017-06-06 NOTE — ED Notes (Signed)
Lidocaine, number 11 blade as well as suture tray to bedside

## 2017-06-06 NOTE — ED Notes (Signed)
Area between shoulder blades is hard but not hard nor is I reddened save for a needle head sized area to the R of center to the swollen area

## 2017-06-06 NOTE — ED Notes (Signed)
Pt reports 1 week history of an abscess to her back   Here for evaluation

## 2017-06-06 NOTE — Discharge Instructions (Signed)
You may take the packing out after 3 days If you should develop increasing pain fever redness swelling or pus from the wound please return to the emergency department immediately. Take Bactrim twice daily for 7 days

## 2017-06-06 NOTE — ED Triage Notes (Signed)
abscess on back in between shoulder blades

## 2017-06-09 ENCOUNTER — Ambulatory Visit (INDEPENDENT_AMBULATORY_CARE_PROVIDER_SITE_OTHER): Payer: Medicare Other | Admitting: Orthopaedic Surgery

## 2017-06-10 ENCOUNTER — Other Ambulatory Visit: Payer: Self-pay | Admitting: *Deleted

## 2017-06-10 ENCOUNTER — Telehealth: Payer: Self-pay | Admitting: *Deleted

## 2017-06-10 NOTE — Addendum Note (Signed)
Addended by: Raynaldo Opitz F on: 06/10/2017 02:53 PM   Modules accepted: Orders

## 2017-06-10 NOTE — Addendum Note (Signed)
Addended by: Raynaldo Opitz F on: 06/10/2017 03:57 PM   Modules accepted: Orders

## 2017-06-10 NOTE — Patient Outreach (Signed)
[facility]) Care Management  06/10/2017  DYESHA HENAULT Jun 11, 1960 [id number]   CSW received consult from [place], Merlene Morse that patient had discharged to [facility] SNF on 05/23/17. CSW had scheduled initial SNF visit for today but when CSW called SNF to confirm patient was still a resident there before driving up to the facility - CSW was informed that she had discharged home.   CSW called & spoke with patient [id number] who informed CSW that she had discharged home from SNF the same night she was admitted as she was unhappy with the facility and their accommodations provided. Patient reports that she had been doing well since she has returned home and is grateful that her daughter, [name] has been able to provide assistance.   Patient reports that she does not feel that any needs from social work but was agreeable with having a [facility] RNCM follow-up for transition of care. CSW will make referral and close case from [place] end.    Raynaldo Opitz, [place] Social Worker cell #: [phone]

## 2017-06-10 NOTE — Patient Outreach (Signed)
[facility]) Care Management  06/10/2017  Katrina [name] [birth year] [id number]   Care coordination/transition of care  CM received a referral on 06/10/17 from [place] for Katrina [name] for the following- Comment: [facility] RN CM to follow-up with transition of care. Patient had discharged      from [facility] on 05/23/17 but then      discharged home. CSW signing off as no CSW needs identified.   Successful telephone outreach to Katrina [name], 57 y/o female referred to Katrina [name] for transition of care after recent hospitalization December 10-17, 2018 for CVA.  Patient has history including moderate COPD, uncontrolled HTN, type 2 DM, uncontrolled with neuropathy, CVA hx with residual of left sided weakness and atypical chest pain. Patient was discharged to [facility] from the hospital transported by her daughter on 05/23/17 "after she got off work at ten o'clock at [employer] reports to CM that she left the facility AMA related to "being filthy.", "my daughter took pictures." and "they did not come by to check on me."   HIPAA/ identity verified during phone call today, and [place] services were discussed with patient, who provided verbal consent for [place] involvement in her care.  Explained that I was contacting patient after a referral form [place] [facility] for transition of care services.  Katrina [name] verbalized understanding and agreement with this plan.  Today, patient reports she is "doing quite okay"   Patient further reports:  Medications: "they have all my sheets" referring to the [place] and she discharge instructions--  CM reviewed 05/23/17 discharge instructions listed in Epic to find out that Katrina [name] started the ordered hydralazine but did not get Fioricet nor nicotine.  She reports she already had pain medications and did not get nicotine patch because she has not stopped smoking "I want to stop  in the future." CM discussed the order to stop taking Excedrin migraine sand Katrina [name] reports "I did not know that and I have been taking it" She confirms she has various allergies " A long list of them"  Provider appointments: -- All upcoming provider appointments were reviewed. She has a scheduled appointment with Katrina [name] 07/04/17 0900 -- patient confirms she did not schedule post-hospital office visit appointments with her Primary care provider, Katrina [name].  Katrina Vater reports changes with providers in this office and uncertainty of provider to be seen.   CM discussed assistance to find an in network primary care provider for future services. Katrina Aydt confirmed her insurance services via united health care medicare and medicaid of [place]  Epic indicates the medicaid card states [facility] MCO: [employer]with a March 08 2016 date and the united health care Dual complete (HMO SNP) effective 08/28/15 card with Katrina [name] as Primary care provider  Social/ Community Resource needs: -- currently denies community resource needs, stating supportive daughter that assist with care needs as indicated, "in the immediate" post-hospital discharge period; however, she reports that her daughter works   Materials engineer of newly diagnosed CAD: CVA - Reports she is managing well with assist of her daughter MS is listed on her discharge instructions and Cm assessed her knowledge of MS dx   Patient denies further issues, concerns, or problems today. I confirmed that patient hasmy direct phone number, and the [facility] CM 24-hour nurse advice phone number should issues arise prior to next scheduled [place] outreach  next week    Plans: [facility] CM initiated the transition of care assessment to identify needs [facility] CM and Katrina Dutil agreed to a home visit on next week and transition of care services per referral [facility] CM to provided a written copy of 05/23/17 hospital  discharge instructions to Katrina Ucci during home visit Routed note to care team members listed in [place] will assist patient to schedule post-hospital discharge office visits with an in network Primary care provider if she has not completed this by home visit    [place]. Lavina Hamman, RN, BSN, [place] Care Management [phone]

## 2017-06-13 ENCOUNTER — Other Ambulatory Visit: Payer: Self-pay

## 2017-06-13 ENCOUNTER — Other Ambulatory Visit: Payer: Self-pay | Admitting: *Deleted

## 2017-06-18 ENCOUNTER — Other Ambulatory Visit: Payer: Self-pay

## 2017-06-18 ENCOUNTER — Encounter (HOSPITAL_COMMUNITY): Payer: Self-pay

## 2017-06-18 ENCOUNTER — Emergency Department (HOSPITAL_COMMUNITY): Payer: Medicare Other

## 2017-06-18 ENCOUNTER — Emergency Department (HOSPITAL_COMMUNITY)
Admission: EM | Admit: 2017-06-18 | Discharge: 2017-06-18 | Disposition: A | Discharge status: Home / Self Care | Payer: Medicare Other | Attending: Emergency Medicine | Admitting: Emergency Medicine

## 2017-06-18 DIAGNOSIS — M25511 Pain in right shoulder: Secondary | ICD-10-CM | POA: Insufficient documentation

## 2017-06-18 DIAGNOSIS — J449 Chronic obstructive pulmonary disease, unspecified: Secondary | ICD-10-CM | POA: Insufficient documentation

## 2017-06-18 DIAGNOSIS — N189 Chronic kidney disease, unspecified: Secondary | ICD-10-CM | POA: Insufficient documentation

## 2017-06-18 DIAGNOSIS — I129 Hypertensive chronic kidney disease with stage 1 through stage 4 chronic kidney disease, or unspecified chronic kidney disease: Secondary | ICD-10-CM | POA: Insufficient documentation

## 2017-06-18 DIAGNOSIS — Z79899 Other long term (current) drug therapy: Secondary | ICD-10-CM | POA: Insufficient documentation

## 2017-06-18 DIAGNOSIS — Y998 Other external cause status: Secondary | ICD-10-CM | POA: Diagnosis not present

## 2017-06-18 DIAGNOSIS — Z8673 Personal history of transient ischemic attack (TIA), and cerebral infarction without residual deficits: Secondary | ICD-10-CM | POA: Insufficient documentation

## 2017-06-18 DIAGNOSIS — F17210 Nicotine dependence, cigarettes, uncomplicated: Secondary | ICD-10-CM | POA: Insufficient documentation

## 2017-06-18 DIAGNOSIS — Y92008 Other place in unspecified non-institutional (private) residence as the place of occurrence of the external cause: Secondary | ICD-10-CM | POA: Insufficient documentation

## 2017-06-18 DIAGNOSIS — W108XXA Fall (on) (from) other stairs and steps, initial encounter: Secondary | ICD-10-CM | POA: Insufficient documentation

## 2017-06-18 DIAGNOSIS — I252 Old myocardial infarction: Secondary | ICD-10-CM | POA: Diagnosis not present

## 2017-06-18 DIAGNOSIS — I2510 Atherosclerotic heart disease of native coronary artery without angina pectoris: Secondary | ICD-10-CM | POA: Insufficient documentation

## 2017-06-18 DIAGNOSIS — E119 Type 2 diabetes mellitus without complications: Secondary | ICD-10-CM | POA: Insufficient documentation

## 2017-06-18 DIAGNOSIS — E785 Hyperlipidemia, unspecified: Secondary | ICD-10-CM | POA: Diagnosis not present

## 2017-06-18 DIAGNOSIS — W19XXXA Unspecified fall, initial encounter: Secondary | ICD-10-CM

## 2017-06-18 DIAGNOSIS — Y939 Activity, unspecified: Secondary | ICD-10-CM | POA: Insufficient documentation

## 2017-06-18 MED ORDER — ACETAMINOPHEN 325 MG PO TABS
650.0000 mg | ORAL_TABLET | Freq: Once | ORAL | Status: AC
  Administered 2017-06-18: 650 mg via ORAL
  Filled 2017-06-18: qty 2

## 2017-06-18 NOTE — ED Notes (Signed)
To radiology

## 2017-06-18 NOTE — ED Provider Notes (Signed)
[facility] EMERGENCY DEPARTMENT Provider Note   CSN: [id number] Arrival date & time: 06/18/17  1420     History   Chief Complaint Chief Complaint  Patient presents with  . Fall    HPI Katrina [name] is a 57 y.o. female who presents the emergency department today for fall that occurred approximately 2-3 hours ago.  Patient states that she was at home and tripped down the last 2 stairs in her basement, landing on the right side of her body.  She denies any head trauma or loss of consciousness.  No nausea or vomiting since the event.  She denies any alcohol or drug use but alter her sense of awareness.  She remembers the events preceding and immediately after.  She is now reporting right clavicle, right shoulder, right elbow, right wrist, right hip, right ankle and left distal tibial pain is worse with movement.  She has not taken anything for this.  She denies any paresthesias.  She was able to ambulate after the event but notes it is painful.  HPI  Past Medical History:  Diagnosis Date  . Allergy   . Anemia 1975-1976   . Anginal pain ([place])   . Anxiety    takes Valium daily as needed  . Arthritis    "spine" (12/03/2016)  . Asthma    has inhalers but doesn't use (12/03/2016)  . CAD (coronary artery disease)    Moderate nonobstructive 2012-2013, [place]; No angiographic evidence of CAD 01/31/15 [facility]  . Chronic bronchitis ([place])    "get it a couple times q yr" (12/03/2016)  . Chronic kidney disease   . Chronic lower back pain    budlging disc   . Claustrophobia   . COPD (chronic obstructive pulmonary disease) ([place])   . Daily headache   . Depression   . Diverticulitis   . Family history of adverse reaction to anesthesia    2 daughters gets extremely sick   . GERD (gastroesophageal reflux disease)    takes Dexilant daily  . Heart murmur   . History of blood transfusion 1975-1976 "several"   no abnormal reaction noted  . History of colitis   . History of colon polyps    benign  . History of gastric ulcer   . History of hiatal hernia    "small one"  . History of MRSA infection 2017  . Hyperlipidemia    was on meds but has been off over a yr  . Hypertension    takes Amlodipine and Maxzide daily  . Insomnia    takes Ambien nightly  . Iron deficiency anemia    "when I was young"  . Lung nodules   . Microscopic hematuria   . Migraine    "2-3/wk" (12/03/2016)  . MS (multiple sclerosis) ([place])    questionable per pt  . Myocardial infarction ([place]) 2000   "mild one"  . Noncompliance   . Osteoporosis   . Peripheral neuropathy    weakness,numbness,and tingling. Takes Gabapentin daily  . Pneumonia 06/2015  . PTSD (post-traumatic stress disorder) dx'd 2016/2017   "was dx'd w/bipolar in 1991; replaced w/PTSD dx 2016/2017" (12/03/2016)  . Restless leg syndrome    takes Requip daily  . 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 Type II diabetes mellitus ([place])    "went off insulin in 2012/2013" (12/03/2016)    Patient Active Problem List   Diagnosis Date Noted  .  Weakness of left leg 05/19/2017  . Hypertension 12/03/2016  . Adverse food reaction 10/14/2016  . Pulmonary nodules 10/14/2016  . Non-seasonal allergic rhinitis due to fungal spores 10/14/2016  . Cigarette smoker 09/24/2016  . Cerebral microvascular disease 08/19/2016  . Non compliance with medical treatment 05/11/2016  . Leg pain, anterior, right 05/11/2016  . Syncope 05/02/2016  . Type 2 diabetes mellitus with diabetic neuropathy ([place]) 05/02/2016  . Restless leg syndrome 05/02/2016  . History of CVA with residual deficit   . Vascular headache   . History of chest pain   . Generalized anxiety disorder   . Chronic bilateral low back pain without sciatica   . Peripheral neuropathy   . Migraine without status migrainosus, not intractable   . PTSD (post-traumatic stress disorder)   . History of cerebrovascular  accident (CVA) with residual deficit   . CVA (cerebral vascular accident) ([place]) 04/03/2016  . Stroke (cerebrum) ([place]) 04/03/2016  . Post traumatic stress disorder 10/06/2015  . Precordial pain   . Migraine syndrome 01/24/2015  . Migraine 01/24/2015  . TIA (transient ischemic attack) 12/25/2014  . Hypertensive urgency   . Atypical chest pain   . Malignant hypertension   . Hypokalemia 04/16/2014  . Muscle weakness (generalized) 04/05/2014  . Stiffness of left hip joint 04/05/2014  . Pain in left hip 04/05/2014  . Diastolic dysfunction 03/31/8526  . Normal coronary arteries 03/20/2014  . PUD (peptic ulcer disease) 03/20/2014  . Type 2 diabetes, uncontrolled, with neuropathy ([place]) 03/20/2014  . Morbid obesity due to excess calories ([place]) 03/20/2014  . Left-sided weakness 03/20/2014  . Paresthesias 03/20/2014  . Cerebrovascular disease 03/20/2014  . Dyspnea on exertion 03/05/2012  . Tobacco abuse 03/05/2012  . Moderate COPD (chronic obstructive pulmonary disease) ([place]) 11/23/2011  . GERD (gastroesophageal reflux disease) 11/23/2011  . Uncontrolled hypertension 11/23/2011    Past Surgical History:  Procedure Laterality Date  . ABDOMINAL AORTOGRAM N/A 12/03/2016   Procedure: Abdominal Aortogram;  Surgeon: Angelia Mould, MD;  Location: [place] [facility];  Service: Cardiovascular;  Laterality: N/A;  . ABDOMINAL HYSTERECTOMY  12/1987  . ADENOIDECTOMY  1975  . ANKLE SURGERY Bilateral 1993; 1995 X2   "stabilzation done; 1 on the right; 2 on the left"  . APPENDECTOMY  1989  . BACK SURGERY    . BREAST LUMPECTOMY Left    "benign tumor"  . CARDIAC CATHETERIZATION N/A 01/31/2015   Procedure: Left Heart Cath and Coronary Angiography;  Surgeon: Burnell Blanks, MD;  Location: [place] [facility];  Service: Cardiovascular;  Laterality: N/A;  . CARDIAC CATHETERIZATION  2009; 2011  . COLONOSCOPY    . DILATION AND CURETTAGE OF UTERUS    . ESOPHAGOGASTRODUODENOSCOPY    .  LAPAROSCOPIC CHOLECYSTECTOMY  1996  . NASAL RECONSTRUCTION  1976  . NASAL SINUS SURGERY  1975  . POSTERIOR LUMBAR FUSION  2005  . RADIOLOGY WITH ANESTHESIA N/A 01/15/2016   Procedure: MRI LUMBAR SPINE WITHOUT;  Surgeon: Medication Radiologist, MD;  Location: [place];  Service: Radiology;  Laterality: N/A;  . RADIOLOGY WITH ANESTHESIA N/A 06/29/2016   Procedure: MRI OF THE BRAIN WITH AND WITHOUT;  Surgeon: Medication Radiologist, MD;  Location: [place];  Service: Radiology;  Laterality: N/A;  . RENAL ANGIOGRAPHY N/A 12/03/2016   Procedure: Renal Angiography;  Surgeon: Angelia Mould, MD;  Location: [place] [facility];  Service: Cardiovascular;  Laterality: N/A;  . TONSILLECTOMY  1992  . TUBAL LIGATION    . [place]  from back of skull; developed;  MRSA from the area that had to be packed    OB History    No data available       Home Medications    Prior to Admission medications   Medication Sig Start Date End Date Taking? Authorizing Provider  amLODipine (NORVASC) 10 MG tablet Take 1 tablet (10 mg total) by mouth daily. 06/05/14   Herminio Commons, MD  atenolol (TENORMIN) 50 MG tablet Take 1 tablet (50 mg total) by mouth daily. 05/02/17   Herminio Commons, MD  chlorthalidone (HYGROTON) 25 MG tablet Take 25 mg by mouth 2 (two) times daily.     [provider]  clopidogrel (PLAVIX) 75 MG tablet Take 75 mg by mouth daily.     [provider]  dexlansoprazole (DEXILANT) 60 MG capsule Take 1 tab by mouth every morning. Patient taking differently: Take 60 mg by mouth every evening.  10/18/16   Esterwood, Amy S, PA-C  furosemide (LASIX) 20 MG tablet Take 1 tablet (20 mg total) by mouth daily. 05/02/17 07/31/17  Herminio Commons, MD  gabapentin (NEURONTIN) 600 MG tablet Take 600 mg by mouth 4 (four) times daily.    [provider]  hydrALAZINE (APRESOLINE) 10 MG tablet Take 1 tablet (10 mg total) by mouth every 6 (six) hours. 05/23/17    Eber Jones, MD  losartan (COZAAR) 50 MG tablet Take 50 mg by mouth 2 (two) times daily.  06/09/16   [provider]  methocarbamol (ROBAXIN) 500 MG tablet Take 1 tablet by mouth every 6 (six) hours as needed for muscle spasms.  05/02/17   [provider]  potassium chloride SA (K-DUR,KLOR-CON) 20 MEQ tablet Take 1 tablet (20 mEq total) by mouth daily. 05/02/17   Herminio Commons, MD  rOPINIRole (REQUIP) 4 MG tablet Take 4 mg by mouth at bedtime.    [provider]  spironolactone (ALDACTONE) 100 MG tablet Take 100 mg by mouth daily.    [provider]  zolpidem (AMBIEN CR) 12.5 MG CR tablet Take 12.5 mg by mouth at bedtime. 05/16/17   [provider]    Family History Family History  Problem Relation Age of Onset  . Coronary artery disease Father   . Emphysema Father   . Heart attack Father   . Stroke Father   . Cancer Father        Unsure of type   . Allergic rhinitis Father   . Depression Mother   . Cancer Mother        skin cancer  . Bipolar disorder Brother   . Drug abuse Brother   . Cancer Brother        leukemia  . Bipolar disorder Daughter   . Allergic rhinitis Daughter   . Asthma Daughter   . Urticaria Daughter   . Diabetes Maternal Grandmother   . Stomach cancer Maternal Grandfather   . Cancer Maternal Grandfather        stomach   . Angioedema Neg Hx   . Atopy Neg Hx   . Eczema Neg Hx   . Immunodeficiency Neg Hx     Social History Social History   Tobacco Use  . Smoking status: Current Every Day Smoker    Packs/day: 2.00    Years: 45.00    Pack years: 90.00    Types: Cigarettes    Start date: 03/04/1971  . Smokeless tobacco: Never Used  Substance Use Topics  . Alcohol use:  No    Alcohol/week: 0.0 oz  . Drug use: No     Allergies   Other; Penicillins; Zithromax [azithromycin]; Aspirin; Pineapple; Strawberry extract; Wheat bran; Yeast-related products; Aspartame and phenylalanine; Mushroom  extract complex; and Nicardipine   Review of Systems Review of Systems  Gastrointestinal: Negative for nausea and vomiting.  Musculoskeletal: Positive for arthralgias. Negative for neck pain.  Skin: Negative for wound.  Neurological: Negative for weakness, numbness and headaches.     Physical Exam Updated Vital Signs BP (!) 179/105 (BP Location: Left Arm) Comment: pt states she has taken her BP meds and this is normal for her  Pulse 84   Temp 98.3 F (36.8 C) (Oral)   Resp 18   Ht 5\' 6"  (1.676 m)   Wt 98.4 kg (217 lb)   SpO2 96%   BMI 35.02 kg/m   Physical Exam  Constitutional: She appears well-developed and well-nourished.  Non-toxic appearing  HENT:  Head: Normocephalic and atraumatic. Head is without raccoon's eyes and without Battle's sign.  Right Ear: Hearing, tympanic membrane, external ear and ear canal normal. Tympanic membrane is not perforated and not erythematous. No hemotympanum.  Left Ear: Hearing, tympanic membrane, external ear and ear canal normal. Tympanic membrane is not perforated and not erythematous. No hemotympanum.  Nose: Nose normal. No rhinorrhea. Right sinus exhibits no maxillary sinus tenderness and no frontal sinus tenderness. Left sinus exhibits no maxillary sinus tenderness and no frontal sinus tenderness.  Mouth/Throat: Uvula is midline, oropharynx is clear and moist and mucous membranes are normal.  No CSF otorrhea.  No battle signs or raccoon eyes.  No evidence of an open or depressed skull fracture.   Eyes: Conjunctivae, EOM and lids are normal. Pupils are equal, round, and reactive to light. Right eye exhibits no discharge. Left eye exhibits no discharge. Right conjunctiva is not injected. Left conjunctiva is not injected. No scleral icterus. Right eye exhibits normal extraocular motion and no nystagmus. Left eye exhibits normal extraocular motion and no nystagmus.  Neck: Trachea normal, normal range of motion, full passive range of motion  without pain and phonation normal. Neck supple. No spinous process tenderness and no muscular tenderness present. No neck rigidity. No tracheal deviation and normal range of motion present.  Cardiovascular: Normal rate, regular rhythm, normal heart sounds and intact distal pulses.  Pulses:      Radial pulses are 2+ on the right side, and 2+ on the left side.       Dorsalis pedis pulses are 2+ on the right side, and 2+ on the left side.       Posterior tibial pulses are 2+ on the right side, and 2+ on the left side.  Pulmonary/Chest: Effort normal and breath sounds normal. No respiratory distress.  Musculoskeletal:       Right shoulder: She exhibits tenderness. She exhibits normal range of motion, no swelling, no effusion, no crepitus, no deformity and no pain.       Right elbow: She exhibits normal range of motion, no swelling, no effusion, no deformity and no laceration. Tenderness found. Radial head and medial epicondyle tenderness noted.       Right wrist: She exhibits tenderness. She exhibits normal range of motion, no bony tenderness, no swelling, no effusion, no crepitus and no deformity.       Right hip: She exhibits tenderness. She exhibits normal range of motion and normal strength.       Right knee: Normal.       Right  ankle: She exhibits normal range of motion, no swelling and no deformity. Tenderness. Lateral malleolus tenderness found. No medial malleolus, no AITFL, no CF ligament, no posterior TFL, no head of 5th metatarsal and no proximal fibula tenderness found. Achilles tendon normal. Achilles tendon exhibits no pain, no defect and normal Thompson's test results.       Left ankle: Normal.       Left lower leg: She exhibits tenderness (echymosis and ttp to mid shin).  Right clavicular tenderness palpation. No snuffbox ttp.  Neurovascularly intact distally. Compartments soft above and below affected joint.   Neurological: She is alert. She has normal strength and normal reflexes. No  cranial nerve deficit or sensory deficit. She displays a negative Romberg sign. Coordination and gait normal.  Reflex Scores:      Bicep reflexes are 2+ on the right side and 2+ on the left side.      Patellar reflexes are 2+ on the right side and 2+ on the left side.      Achilles reflexes are 2+ on the right side and 2+ on the left side. Speech clear. Follows commands. No facial droop. PERRLA. EOM grossly intact. CN III-XII grossly intact. Grossly moves all extremities 4 without ataxia. Able and appropriate strength for age to upper and lower extremities bilaterally.  Skin: Skin is warm, dry and intact. No laceration noted. She is not diaphoretic. No erythema. No pallor.  Psychiatric: She has a normal mood and affect.  Nursing note and vitals reviewed.   ED Treatments / Results  Labs (all labs ordered are listed, but only abnormal results are displayed) Labs Reviewed - No data to display  EKG  EKG Interpretation None       Radiology Dg Clavicle Right  Result Date: 06/18/2017 CLINICAL DATA:  Right clavicle pain following a fall at home today. EXAM: RIGHT CLAVICLE - 2+ VIEWS COMPARISON:  Right shoulder obtained earlier today. FINDINGS: There is no evidence of fracture or other focal bone lesions. Soft tissues are unremarkable. IMPRESSION: No fracture or dislocation. Electronically Signed   By: Claudie Revering M.D.   On: 06/18/2017 16:00   Dg Shoulder Right  Result Date: 06/18/2017 CLINICAL DATA:  Fall EXAM: RIGHT SHOULDER - 2+ VIEW COMPARISON:  None. FINDINGS: No acute fracture. No dislocation.  Unremarkable soft tissues. IMPRESSION: No acute bony pathology. Electronically Signed   By: Marybelle Killings M.D.   On: 06/18/2017 15:53   Dg Elbow Complete Right  Result Date: 06/18/2017 CLINICAL DATA:  Right elbow pain following a fall at home today. EXAM: RIGHT ELBOW - COMPLETE 3+ VIEW COMPARISON:  None. FINDINGS: There is no evidence of fracture, dislocation, or joint effusion. There is no  evidence of arthropathy or other focal bone abnormality. Soft tissues are unremarkable. IMPRESSION: Normal examination. Electronically Signed   By: Claudie Revering M.D.   On: 06/18/2017 15:55   Dg Wrist Complete Right  Result Date: 06/18/2017 CLINICAL DATA:  Right wrist pain following a fall at home today. EXAM: RIGHT WRIST - COMPLETE 3+ VIEW COMPARISON:  None. FINDINGS: There is no evidence of fracture or dislocation. There is no evidence of arthropathy or other focal bone abnormality. Soft tissues are unremarkable. IMPRESSION: Normal examination. Electronically Signed   By: Claudie Revering M.D.   On: 06/18/2017 15:54   Dg Tibia/fibula Left  Result Date: 06/18/2017 CLINICAL DATA:  Left lower leg pain following a fall at home today. EXAM: LEFT TIBIA AND FIBULA - 2 VIEW COMPARISON:  Left ankle radiographs  dated 02/17/2017. FINDINGS: A lateral malleolus fixation screws again demonstrated. No fracture or dislocation seen. Moderate-sized anterior patellar spurs. IMPRESSION: No fracture or dislocation. Electronically Signed   By: Claudie Revering M.D.   On: 06/18/2017 15:59   Dg Ankle Complete Right  Result Date: 06/18/2017 CLINICAL DATA:  Right ankle pain following a fall at home today. EXAM: RIGHT ANKLE - COMPLETE 3+ VIEW COMPARISON:  06/13/2015. FINDINGS: Moderately large posterior calcaneal spur. Better defined curvilinear calcific density distal to the lateral malleolus. No fracture, dislocation or effusion. IMPRESSION: No fracture or dislocation. Electronically Signed   By: Claudie Revering M.D.   On: 06/18/2017 15:57   Dg Hip Unilat With Pelvis 2-3 Views Right  Result Date: 06/18/2017 CLINICAL DATA:  Right hip pain following a fall at home today. EXAM: DG HIP (WITH OR WITHOUT PELVIS) 2-3V RIGHT COMPARISON:  04/25/2017. FINDINGS: Normal appearing right hip without fracture or dislocation. Lower lumbar spine postoperative changes with fixation hardware. IMPRESSION: No fracture or dislocation. Electronically Signed    By: Claudie Revering M.D.   On: 06/18/2017 15:56    Procedures Procedures (including critical care time)  Medications Ordered in ED Medications  acetaminophen (TYLENOL) tablet 650 mg (650 mg Oral Given 06/18/17 1540)     Initial Impression / Assessment and Plan / ED Course  I have reviewed the triage vital signs and the nursing notes.  Pertinent labs & imaging results that were available during my care of the patient were reviewed by me and considered in my medical decision making (see chart for details).     57 year old female presenting after mechanical fall.  No head trauma or loss of consciousness.   She denies headache or neurologic symptoms.  She is neurologically intact and without signs of head injury on exam.  She complains of right-sided pain.  There is no obvious deformity and she has full range of motion.  Patient X-Ray negative for obvious fracture or dislocation. Pain managed in ED. Pt advised to follow up PCP for possibility of missed fracture diagnosis. Conservative therapy recommended and discussed. Patient will be dc home & is agreeable with above plan.  Final Clinical Impressions(s) / ED Diagnoses   Final diagnoses:  Fall, initial encounter    ED Discharge Orders    None       Lorelle Gibbs 06/18/17 1612    Daleen Bo, MD 06/19/17 1216

## 2017-06-18 NOTE — ED Notes (Signed)
Med offered   Pt states she does not take tylenol as she does not take asa products  She is educated that tylenol is not in asa class of drugs  When asked if she wished to refuse, pt took tylenol tabs

## 2017-06-18 NOTE — Discharge Instructions (Signed)
Please read and follow all provided instructions.  You have been seen today for Fall  Tests performed today include: X-ray of the affected area - does NOT show any broken bones or dislocations.  Vital signs. See below for your results today.   Home care instructions: -- *PRICE in the first 24-48 hours after injury: Protect (with brace, splint, sling), if given by your provider Rest Ice- Do not apply ice pack directly to your skin, place towel or similar between your skin and ice/ice pack. Apply ice for 20 min, then remove for 40 min while awake Compression- Wear brace, elastic bandage, splint as directed by your provider Elevate affected extremity above the level of your heart when not walking around for the first 24-48 hours   Use Tylenol these (Acetaminophen) 650mg  every 6 hours as needed for pain (do not exceed max dose in 24 hours, 4000mg )  Follow-up instructions: Please follow-up with your primary care provider if you continue to have significant pain in 1 week. In this case you may have a more severe injury that requires further care.   Return instructions:  Please return if your toes or feet are numb or tingling, appear gray or blue, or you have severe pain (also elevate the leg and loosen splint or wrap if you were given one) Please return to the Emergency Department if you experience worsening symptoms.  Please return if you have any other emergent concerns. Additional Information:  Your vital signs today were: BP (!) 179/105 (BP Location: Left Arm) Comment: pt states she has taken her BP meds and this is normal for her   Pulse 84    Temp 98.3 F (36.8 C) (Oral)    Resp 18    Ht 5\' 6"  (1.676 m)    Wt 98.4 kg (217 lb)    SpO2 96%    BMI 35.02 kg/m  If your blood pressure (BP) was elevated above 135/85 this visit, please have this repeated by your doctor within one month. ---------------

## 2017-06-18 NOTE — ED Notes (Signed)
From radiology 

## 2017-06-18 NOTE — ED Triage Notes (Signed)
Reports of falling down 2 steps today. Complains of left leg, right foot and right arm pain. Denies neck or back pain.

## 2017-06-21 ENCOUNTER — Other Ambulatory Visit: Payer: Self-pay | Admitting: Nurse Practitioner

## 2017-06-21 DIAGNOSIS — Z139 Encounter for screening, unspecified: Secondary | ICD-10-CM

## 2017-07-04 ENCOUNTER — Encounter: Payer: Self-pay | Admitting: Cardiovascular Disease

## 2017-07-04 ENCOUNTER — Other Ambulatory Visit: Payer: Self-pay

## 2017-07-04 ENCOUNTER — Ambulatory Visit (INDEPENDENT_AMBULATORY_CARE_PROVIDER_SITE_OTHER): Payer: Medicare Other | Admitting: Cardiovascular Disease

## 2017-07-04 VITALS — BP 173/109 | HR 87 | Ht 67.0 in | Wt 215.0 lb

## 2017-07-04 DIAGNOSIS — I639 Cerebral infarction, unspecified: Secondary | ICD-10-CM

## 2017-07-04 DIAGNOSIS — I10 Essential (primary) hypertension: Secondary | ICD-10-CM | POA: Diagnosis not present

## 2017-07-04 DIAGNOSIS — G35 Multiple sclerosis: Secondary | ICD-10-CM

## 2017-07-04 DIAGNOSIS — G4730 Sleep apnea, unspecified: Secondary | ICD-10-CM | POA: Diagnosis not present

## 2017-07-04 MED ORDER — HYDRALAZINE HCL 50 MG PO TABS: 50.0000 mg | ORAL_TABLET | Freq: Three times a day (TID) | ORAL | 6 refills | Status: DC

## 2017-07-04 NOTE — Patient Instructions (Signed)
Medication Instructions:   Increase Hydralazine to 50mg  three times per day.  Continue all other medications.    Labwork: none  Testing/Procedures: none  Follow-Up: 3 months   Any Other Special Instructions Will Be Listed Below (If Applicable).  If you need a refill on your cardiac medications before your next appointment, please call your pharmacy.

## 2017-07-04 NOTE — Progress Notes (Signed)
SUBJECTIVE: The patient presents for routine follow-up of malignant hypertension.  She also has a history of stroke and medication noncompliance.  She was hospitalized in December 2018 and had an MRI which showed lesions concerning for possible multiple sclerosis.  She was evaluated by neurology.  She was seen by her nephrologist in [place], Dr. Hollie Salk.  She has made a referral to the multiple sclerosis center at [facility].  She currently denies chest pain.  She has occasional shortness of breath with saturations which sometimes dipped down into the 87% range.  She has been having more tremors and falls.  She tells me that her PCP has readjusted several of her antihypertensive medications.  The patient is frustrated by this.   Review of Systems: As per "subjective", otherwise negative.  Allergies  Allergen Reactions  . Other Anaphylaxis and Swelling    [place]  . Penicillins Anaphylaxis    Has patient had a PCN reaction causing immediate rash, facial/tongue/throat swelling, SOB or lightheadedness with hypotension: Yes Has patient had a PCN reaction causing severe rash involving mucus membranes or skin necrosis: Yes Has patient had a PCN reaction that required hospitalization Yes Has patient had a PCN reaction occurring within the last 10 years: No If all of the above answers are "NO", then may proceed with Cephalosporin use.   . Zithromax [Azithromycin] Anaphylaxis  . Aspirin Other (See Comments)    Due to stomach ulcers.   . Pineapple Rash  . Strawberry Extract Rash and Hives  . Wheat Bran Other (See Comments)    Sneezing, rhinorrhea  . Yeast-Related Products Other (See Comments)    Sneezing, rhinorrhea  . Aspartame And Phenylalanine Palpitations  . Mushroom Extract Complex Rash  . Nicardipine Nausea And Vomiting and Other (See Comments)    shaking    Current Outpatient Medications  Medication Sig Dispense Refill  . albuterol (PROAIR HFA) 108 (90 Base) MCG/ACT inhaler  Inhale into the lungs.    Marland Kitchen amLODipine (NORVASC) 10 MG tablet Take 1 tablet (10 mg total) by mouth daily. 30 tablet 6  . atenolol (TENORMIN) 50 MG tablet Take 1 tablet (50 mg total) by mouth daily. 30 tablet 6  . clopidogrel (PLAVIX) 75 MG tablet Take 75 mg by mouth daily.     Marland Kitchen dexlansoprazole (DEXILANT) 60 MG capsule Take 1 tab by mouth every morning. (Patient taking differently: Take 60 mg by mouth every evening. ) 30 capsule 1  . EPINEPHrine (EPIPEN 2-PAK) 0.3 mg/0.3 mL IJ SOAJ injection Inject into the muscle.    . furosemide (LASIX) 20 MG tablet Take 1 tablet (20 mg total) by mouth daily. 30 tablet 6  . gabapentin (NEURONTIN) 600 MG tablet Take 600 mg by mouth 4 (four) times daily.    . hydrALAZINE (APRESOLINE) 10 MG tablet Take 1 tablet (10 mg total) by mouth every 6 (six) hours.    Marland Kitchen losartan (COZAAR) 50 MG tablet Take 50 mg by mouth 2 (two) times daily.   3  . propranolol ER (INDERAL LA) 60 MG 24 hr capsule TAKE 1 CAPSULE(60 MG) BY MOUTH DAILY    . rOPINIRole (REQUIP) 4 MG tablet Take 4 mg by mouth at bedtime.    Marland Kitchen spironolactone (ALDACTONE) 100 MG tablet Take 100 mg by mouth daily.    Marland Kitchen zolpidem (AMBIEN CR) 12.5 MG CR tablet Take 12.5 mg by mouth at bedtime.     No current facility-administered medications for this visit.     Past Medical History:  Diagnosis Date  . Allergy   . Anemia 1975-1976   . Anginal pain ([place])   . Anxiety    takes Valium daily as needed  . Arthritis    "spine" (12/03/2016)  . Asthma    has inhalers but doesn't use (12/03/2016)  . CAD (coronary artery disease)    Moderate nonobstructive 2012-2013, [place]; No angiographic evidence of CAD 01/31/15 [facility]  . Chronic bronchitis ([place])    "get it a couple times q yr" (12/03/2016)  . Chronic kidney disease   . Chronic lower back pain    budlging disc   . Claustrophobia   . COPD (chronic obstructive pulmonary disease) ([place])   . Daily headache   . Depression   . Diverticulitis   . Family history of adverse  reaction to anesthesia    2 daughters gets extremely sick   . GERD (gastroesophageal reflux disease)    takes Dexilant daily  . Heart murmur   . History of blood transfusion 1975-1976 "several"   no abnormal reaction noted  . History of colitis   . History of colon polyps    benign  . History of gastric ulcer   . History of hiatal hernia    "small one"  . History of MRSA infection 2017  . Hyperlipidemia    was on meds but has been off over a yr  . Hypertension    takes Amlodipine and Maxzide daily  . Insomnia    takes Ambien nightly  . Iron deficiency anemia    "when I was young"  . Lung nodules   . Microscopic hematuria   . Migraine    "2-3/wk" (12/03/2016)  . MS (multiple sclerosis) ([place])    questionable per pt  . Myocardial infarction ([place]) 2000   "mild one"  . Noncompliance   . Osteoporosis   . Peripheral neuropathy    weakness,numbness,and tingling. Takes Gabapentin daily  . Pneumonia 06/2015  . PTSD (post-traumatic stress disorder) dx'd 2016/2017   "was dx'd w/bipolar in 1991; replaced w/PTSD dx 2016/2017" (12/03/2016)  . Restless leg syndrome    takes Requip daily  . 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 Type II diabetes mellitus ([place])    "went off insulin in 2012/2013" (12/03/2016)    Past Surgical History:  Procedure Laterality Date  . ABDOMINAL AORTOGRAM N/A 12/03/2016   Procedure: Abdominal Aortogram;  Surgeon: Angelia Mould, MD;  Location: [facility];  Service: Cardiovascular;  Laterality: N/A;  . ABDOMINAL HYSTERECTOMY  12/1987  . ADENOIDECTOMY  1975  . ANKLE SURGERY Bilateral 1993; 1995 X2   "stabilzation done; 1 on the right; 2 on the left"  . APPENDECTOMY  1989  . BACK SURGERY    . BREAST LUMPECTOMY Left    "benign tumor"  . CARDIAC CATHETERIZATION N/A 01/31/2015   Procedure: Left Heart Cath and Coronary Angiography;  Surgeon: Burnell Blanks, MD;  Location: [facility];  Service: Cardiovascular;  Laterality: N/A;  . CARDIAC CATHETERIZATION  2009; 2011  . COLONOSCOPY    . DILATION AND CURETTAGE OF UTERUS    . ESOPHAGOGASTRODUODENOSCOPY    . LAPAROSCOPIC CHOLECYSTECTOMY  1996  . NASAL RECONSTRUCTION  1976  . NASAL SINUS SURGERY  1975  . POSTERIOR LUMBAR FUSION  2005  . RADIOLOGY WITH ANESTHESIA N/A 01/15/2016   Procedure: MRI LUMBAR SPINE WITHOUT;  Surgeon: Medication Radiologist, MD;  Location: [facility];  Service: Radiology;  Laterality: N/A;  . RADIOLOGY WITH ANESTHESIA N/A 06/29/2016   Procedure: MRI OF THE BRAIN WITH AND WITHOUT;  Surgeon: Medication Radiologist, MD;  Location: [facility];  Service: Radiology;  Laterality: N/A;  . RENAL ANGIOGRAPHY N/A 12/03/2016   Procedure: Renal Angiography;  Surgeon: Angelia Mould, MD;  Location: [facility];  Service: Cardiovascular;  Laterality: N/A;  . TONSILLECTOMY  1992  . TUBAL LIGATION    . 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 Marital status: Divorced    Spouse name: Not on file  . Number of children: 3  . Years of education: Some college  . Highest education level: Not on file  Social Needs  . Financial resource strain: Not on file  . Food insecurity - worry: Not on file  . Food insecurity - inability: Not on file  . Transportation needs - medical: Not on file  . Transportation needs - non-medical: Not on file  Occupational History    Comment: Disability for back pain  Tobacco Use  . Smoking status: Current Every Day Smoker    Packs/day: 1.50    Years: 45.00    Pack years: 67.50    Types: Cigarettes    Start date: 03/04/1971  . Smokeless tobacco: Never Used  Substance and Sexual Activity  . Alcohol use: No    Alcohol/week: 0.0 oz  . Drug use: No  . Sexual activity: No    Birth control/protection: Surgical  Other Topics Concern  . Not  on file  Social History Narrative   Lives at home with her daughter.   Right-handed.   1-2 cups coffee in the morning and 2 sodas per day.     Vitals:   07/04/17 0845  BP: (!) 173/109  Pulse: 87  SpO2: 98%  Weight: 215 lb (97.5 kg)  Height: 5\' 7"  (1.702 m)    Wt Readings from Last 3 Encounters:  07/04/17 215 lb (97.5 kg)  06/18/17 217 lb (98.4 kg)  06/06/17 216 lb (98 kg)     PHYSICAL EXAM General: NAD HEENT: Normal. Neck: No JVD, no thyromegaly. Lungs: Clear to auscultation bilaterally with normal respiratory effort. CV: Regular rate and rhythm, normal S1/S2, no S3/S4, no murmur. No pretibial or periankle edema.  No carotid bruit.   Abdomen: Soft, nontender, no distention.  Neurologic: Alert and oriented.  Psych: Normal affect. Skin: Normal. Musculoskeletal: No gross deformities.    ECG: Most recent ECG reviewed.   Labs: Lab Results  Component Value Date/Time   K 4.1 05/16/2017 09:11 PM   BUN 23 (H) 05/16/2017 09:11 PM   CREATININE 1.00 05/16/2017 09:11 PM   ALT 14 05/16/2017 08:50 PM   TSH 1.678 12/04/2016 02:16 AM   TSH 3.630 03/20/2014 12:37 AM   HGB 13.9 05/16/2017 09:11 PM     Lipids: Lab Results  Component Value Date/Time   LDLCALC 69 05/17/2017 02:07 AM   CHOL 123 05/17/2017 02:07 AM   TRIG 98 05/17/2017 02:07 AM   HDL 34 (L) 05/17/2017 02:07 AM       ASSESSMENT AND PLAN: 1. Malignant hypertension with end-organ damage (history of stroke):  Blood pressure is severely elevated.  She is on amlodipine 10 mg, atenolol 50 mg, losartan 50 mg twice daily, hydralazine 10 mg every 6 hours, long-acting propranolol 60 mg, and spironolactone 100  mg daily.  Several medications and doses have been adjusted by other physicians since her last visit with me. She says the propranolol does not alleviate her tremors.  I will likely simplify her medications with a single beta-blocker by the time of her next visit. For now, I will switch hydralazine to 50 mg three  times daily.  She has been only taking 10 mg twice daily due to the complexity of administration. I would be curious to see what happens with her blood pressure once she receives treatment for multiple sclerosis.  2.  Sleep disordered breathing: She has yet to pursue the sleep study which I previously ordered.  Sleep apnea is a known etiology of uncontrolled hypertension.  3.  History of CVA: She is on Plavix.  4.  Multiple sclerosis: This is a fairly recent diagnosis.  She is being scheduled to be evaluated by the multiple sclerosis center at [facility].     Disposition: Follow up 3 months   Kate Sable, M.D., F.A.C.C.

## 2017-07-08 ENCOUNTER — Ambulatory Visit
Admission: RE | Admit: 2017-07-08 | Discharge: 2017-07-08 | Disposition: A | Discharge status: Home / Self Care | Payer: Medicare Other | Source: Ambulatory Visit | Attending: Nurse Practitioner | Admitting: Nurse Practitioner

## 2017-07-08 DIAGNOSIS — Z139 Encounter for screening, unspecified: Secondary | ICD-10-CM

## 2017-07-12 ENCOUNTER — Other Ambulatory Visit (HOSPITAL_COMMUNITY): Payer: Self-pay | Admitting: Pulmonary Disease

## 2017-07-12 ENCOUNTER — Other Ambulatory Visit (HOSPITAL_COMMUNITY): Payer: Self-pay | Admitting: Respiratory Therapy

## 2017-07-12 DIAGNOSIS — R0602 Shortness of breath: Secondary | ICD-10-CM

## 2017-07-12 DIAGNOSIS — R911 Solitary pulmonary nodule: Secondary | ICD-10-CM

## 2017-07-14 ENCOUNTER — Ambulatory Visit (HOSPITAL_COMMUNITY): Payer: Medicare Other

## 2017-07-19 ENCOUNTER — Ambulatory Visit (HOSPITAL_COMMUNITY)
Admission: RE | Admit: 2017-07-19 | Discharge: 2017-07-19 | Disposition: A | Discharge status: Home / Self Care | Payer: Medicare Other | Source: Ambulatory Visit | Attending: Pulmonary Disease | Admitting: Pulmonary Disease

## 2017-07-19 DIAGNOSIS — R918 Other nonspecific abnormal finding of lung field: Secondary | ICD-10-CM | POA: Insufficient documentation

## 2017-07-19 DIAGNOSIS — R911 Solitary pulmonary nodule: Secondary | ICD-10-CM | POA: Diagnosis present

## 2017-07-19 DIAGNOSIS — I700 Atherosclerosis of aorta: Secondary | ICD-10-CM | POA: Insufficient documentation

## 2017-07-21 ENCOUNTER — Encounter (HOSPITAL_COMMUNITY): Payer: Self-pay | Admitting: Emergency Medicine

## 2017-07-21 ENCOUNTER — Ambulatory Visit (HOSPITAL_COMMUNITY)
Admission: RE | Admit: 2017-07-21 | Discharge: 2017-07-21 | Disposition: A | Discharge status: Home / Self Care | Payer: Medicare Other | Source: Ambulatory Visit | Attending: Pulmonary Disease | Admitting: Pulmonary Disease

## 2017-07-21 ENCOUNTER — Emergency Department (HOSPITAL_COMMUNITY)
Admission: EM | Admit: 2017-07-21 | Discharge: 2017-07-21 | Disposition: A | Discharge status: Home / Self Care | Payer: Medicare Other | Attending: Emergency Medicine | Admitting: Emergency Medicine

## 2017-07-21 ENCOUNTER — Other Ambulatory Visit: Payer: Self-pay

## 2017-07-21 DIAGNOSIS — I10 Essential (primary) hypertension: Secondary | ICD-10-CM | POA: Insufficient documentation

## 2017-07-21 DIAGNOSIS — Z5321 Procedure and treatment not carried out due to patient leaving prior to being seen by health care provider: Secondary | ICD-10-CM | POA: Insufficient documentation

## 2017-07-21 DIAGNOSIS — R0602 Shortness of breath: Secondary | ICD-10-CM

## 2017-07-21 LAB — PULMONARY FUNCTION TEST
DL/VA % PRED: 63 %
DL/VA: 3.27 ml/min/mmHg/L
DLCO UNC % PRED: 49 %
DLCO unc: 13.9 ml/min/mmHg
FEF 25-75 PRE: 1.78 L/s
FEF 25-75 Post: 2.53 L/sec
FEF2575-%Change-Post: 42 %
FEF2575-%PRED-POST: 94 %
FEF2575-%Pred-Pre: 66 %
FEV1-%CHANGE-POST: 9 %
FEV1-%Pred-Post: 63 %
FEV1-%Pred-Pre: 58 %
FEV1-POST: 1.88 L
FEV1-Pre: 1.72 L
FEV1FVC-%Change-Post: 4 %
FEV1FVC-%PRED-PRE: 103 %
FEV6-%Change-Post: 4 %
FEV6-%PRED-POST: 59 %
FEV6-%Pred-Pre: 57 %
FEV6-POST: 2.2 L
FEV6-Pre: 2.09 L
FEV6FVC-%PRED-POST: 103 %
FEV6FVC-%Pred-Pre: 103 %
FVC-%CHANGE-POST: 4 %
FVC-%PRED-POST: 58 %
FVC-%Pred-Pre: 55 %
FVC-Post: 2.2 L
FVC-Pre: 2.09 L
PRE FEV1/FVC RATIO: 82 %
PRE FEV6/FVC RATIO: 100 %
Post FEV1/FVC ratio: 85 %
Post FEV6/FVC ratio: 100 %

## 2017-07-21 MED ORDER — ALBUTEROL SULFATE (2.5 MG/3ML) 0.083% IN NEBU
2.5000 mg | INHALATION_SOLUTION | Freq: Once | RESPIRATORY_TRACT | Status: AC
Start: 2017-07-21 — End: 2017-07-21
  Administered 2017-07-21: 2.5 mg via RESPIRATORY_TRACT

## 2017-07-21 NOTE — ED Notes (Signed)
PT stated she is not waiting and ambulated outside and states she will walk home.

## 2017-07-21 NOTE — ED Triage Notes (Signed)
PT brought in by [facility] today and states she has had a headache and HTN over the past 3 days. PT denies any recent blood pressure medication changes. PT states her home health nurse took her B/P today and told pt to come to ED for treatment.

## 2017-08-08 ENCOUNTER — Other Ambulatory Visit: Payer: Self-pay

## 2017-08-08 ENCOUNTER — Telehealth: Payer: Self-pay | Admitting: Neurology

## 2017-08-08 ENCOUNTER — Ambulatory Visit (INDEPENDENT_AMBULATORY_CARE_PROVIDER_SITE_OTHER): Payer: Medicare Other | Admitting: Neurology

## 2017-08-08 VITALS — BP 199/113 | HR 90 | Ht 66.0 in | Wt 217.0 lb

## 2017-08-08 DIAGNOSIS — R9089 Other abnormal findings on diagnostic imaging of central nervous system: Secondary | ICD-10-CM

## 2017-08-08 DIAGNOSIS — I679 Cerebrovascular disease, unspecified: Secondary | ICD-10-CM | POA: Diagnosis not present

## 2017-08-08 NOTE — Telephone Encounter (Signed)
Patient is very uncooperative and rude during today's visit to physician and nurse.   We will discharge her from our clinic

## 2017-08-08 NOTE — Progress Notes (Signed)
PATIENT: Katrina [name] DOB: 12-Oct-1960  Chief Complaint  Patient presents with  . Follow-up    RM EMG 4 with Chantella (daughter). Here to f/u on MS. Had stroke back in December. Was experiencing left sided weakness. Also c/o tremors.      HISTORICAL  Katrina [name] is a 57 years old right-handed female, seen in refer by her primary care physician Dr. Berkley Harvey for evaluation of left-sided weakness, blacking out spells, initial evaluation was December 12th 2017  I reviewed and summarized medical history, she had a past medical history of hypertension, anxiety, asthma, COPD, hyperlipidemia, restless leg syndrome, peripheral neuropathy, reported a history of stroke, she still smoked 2 packs a day  Patient reportedly strokelike symptoms since 2015, she presented with sudden onset left arm and leg weakness, with residual left hemiparesis, ambulate with a cane, but she demonstrated variable effort on examinations. She also reported acute onset left visual field deficit In December 2016, also complains of right leg pain, numbness involving both arm and legs,  She complains bilateral frontal constant pressure headaches, I personally reviewed CAT scan of the brain without contrast in November 2017, no acute abnormality, periventricular small vessel disease.   MRI of lumbar in 2017, multilevel degenerative disc disease, no significant foraminal canal stenosis.  MRI of the brain was attempted in February 2017, significant motion movement, patient reported history of PTSD, severe claustrophobia, could not tolerate even open MRI IV Ativan.  UPDATE August 19 2016: Her blood pressure is elevated 213/105 today, she is taking amlodipine 10 mg daily, Cozaar 50 mg daily, hydrochlorothiazide 25 mg daily,  She complained generalized fatigue, bilateral finger and feet paresthesia, shaking spells,  I have personally reviewed MRI of the brain January 2018, compared to previous MRIs, evidence of  supratentorium small vessel disease, no evidence of acute abnormality, stable since 2015, the patterns are not typical for multiple sclerosis.  Ultrasound of carotid artery in November 2017, mild carotid atherosclerosis, less than 50% narrowing bilaterally, bilateral vertebral artery flow were patent anterograde.   48 hours Holter monitoring in December 2017 showed sinus rhythm, no significant arrhythmia.  Ultrasound of bilateral lower extremity December 2017 was negative for deep venous thrombosis.  Echocardiogram in October 2017, The size was normal, ejection fraction 65-70%,  I was able to review [facility] record, UA on August 17 2016 showed elevated protein, moderate blood,  UPDATE March 4th 2019: She is accompanied by her daughter at today's clinical visit, she was admitted to the hospital in December 2018 for chest pain, left-sided weakness, had MRI of the brain, which was poor quality, periventricular small vessel disease, there was also a concern of possible MS, she underwent lumbar puncture,  CSF study, WBC less than 10, RBC was 1, oligoclonal band was 0, negative VDRL, total protein 37, glucose 83  Laboratory evaluation showed normal negative RPR, B12,  homocystine level was elevated 18.6, angiotension converting enzyme level,  REVIEW OF SYSTEMS: Full 14 system review of systems performed and notable only for fatigue, eye discharge, double vision, food allergy, dizziness, headaches, depression   ALLERGIES: Allergies  Allergen Reactions  . Other Anaphylaxis and Swelling    [place]  . Penicillins Anaphylaxis    Has patient had a PCN reaction causing immediate rash, facial/tongue/throat swelling, SOB or lightheadedness with hypotension: Yes Has patient had a PCN reaction causing severe rash involving mucus membranes or skin necrosis: Yes Has patient had a PCN reaction that required hospitalization Yes Has patient had a  PCN reaction occurring within the last 10 years: No If all of the  above answers are "NO", then may proceed with Cephalosporin use.   . Zithromax [Azithromycin] Anaphylaxis  . Aspirin Other (See Comments)    Due to stomach ulcers.   . Pineapple Rash  . Strawberry Extract Rash and Hives  . Wheat Bran Other (See Comments)    Sneezing, rhinorrhea  . Yeast-Related Products Other (See Comments)    Sneezing, rhinorrhea  . Aspartame And Phenylalanine Palpitations  . Mushroom Extract Complex Rash  . Nicardipine Nausea And Vomiting and Other (See Comments)    shaking    HOME MEDICATIONS: Current Outpatient Medications  Medication Sig Dispense Refill  . albuterol (PROAIR HFA) 108 (90 Base) MCG/ACT inhaler Inhale into the lungs.    Marland Kitchen amLODipine (NORVASC) 10 MG tablet Take 1 tablet (10 mg total) by mouth daily. 30 tablet 6  . atenolol (TENORMIN) 50 MG tablet Take 1 tablet (50 mg total) by mouth daily. 30 tablet 6  . clopidogrel (PLAVIX) 75 MG tablet Take 75 mg by mouth daily.     Marland Kitchen dexlansoprazole (DEXILANT) 60 MG capsule Take 1 tab by mouth every morning. (Patient taking differently: Take 60 mg by mouth every evening. ) 30 capsule 1  . EPINEPHrine (EPIPEN 2-PAK) 0.3 mg/0.3 mL IJ SOAJ injection Inject into the muscle.    . gabapentin (NEURONTIN) 600 MG tablet Take 600 mg by mouth 4 (four) times daily.    . hydrALAZINE (APRESOLINE) 50 MG tablet Take 1 tablet (50 mg total) by mouth 3 (three) times daily. 90 tablet 6  . losartan (COZAAR) 50 MG tablet Take 50 mg by mouth 2 (two) times daily.   3  . propranolol ER (INDERAL LA) 60 MG 24 hr capsule TAKE 1 CAPSULE(60 MG) BY MOUTH DAILY    . rOPINIRole (REQUIP) 4 MG tablet Take 4 mg by mouth at bedtime.    Marland Kitchen spironolactone (ALDACTONE) 100 MG tablet Take 100 mg by mouth daily.    Marland Kitchen zolpidem (AMBIEN CR) 12.5 MG CR tablet Take 12.5 mg by mouth at bedtime.     No current facility-administered medications for this visit.     PAST MEDICAL HISTORY: Past Medical History:  Diagnosis Date  . Allergy   . Anemia 1975-1976     . Anginal pain ([place])   . Anxiety    takes Valium daily as needed  . Arthritis    "spine" (12/03/2016)  . Asthma    has inhalers but doesn't use (12/03/2016)  . CAD (coronary artery disease)    Moderate nonobstructive 2012-2013, [place]; No angiographic evidence of CAD 01/31/15 [facility]  . Chronic bronchitis ([place])    "get it a couple times q yr" (12/03/2016)  . Chronic kidney disease   . Chronic lower back pain    budlging disc   . Claustrophobia   . COPD (chronic obstructive pulmonary disease) ([place])   . Daily headache   . Depression   . Diverticulitis   . Family history of adverse reaction to anesthesia    2 daughters gets extremely sick   . GERD (gastroesophageal reflux disease)    takes Dexilant daily  . Heart murmur   . History of blood transfusion 1975-1976 "several"   no abnormal reaction noted  . History of colitis   . History of colon polyps    benign  . History of gastric ulcer   . History of hiatal hernia    "small one"  . History of MRSA infection  2017  . Hyperlipidemia    was on meds but has been off over a yr  . Hypertension    takes Amlodipine and Maxzide daily  . Insomnia    takes Ambien nightly  . Iron deficiency anemia    "when I was young"  . Lung nodules   . Microscopic hematuria   . Migraine    "2-3/wk" (12/03/2016)  . MS (multiple sclerosis) ([place])    questionable per pt  . Myocardial infarction ([place]) 2000   "mild one"  . Noncompliance   . Osteoporosis   . Peripheral neuropathy    weakness,numbness,and tingling. Takes Gabapentin daily  . Pneumonia 06/2015  . PTSD (post-traumatic stress disorder) dx'd 2016/2017   "was dx'd w/bipolar in 1991; replaced w/PTSD dx 2016/2017" (12/03/2016)  . Restless leg syndrome    takes Requip daily  . 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 Type II diabetes mellitus ([place])    "went off insulin in 2012/2013" (12/03/2016)    PAST  SURGICAL HISTORY: Past Surgical History:  Procedure Laterality Date  . ABDOMINAL AORTOGRAM N/A 12/03/2016   Procedure: Abdominal Aortogram;  Surgeon: Angelia Mould, MD;  Location: [facility];  Service: Cardiovascular;  Laterality: N/A;  . ABDOMINAL HYSTERECTOMY  12/1987  . ADENOIDECTOMY  1975  . ANKLE SURGERY Bilateral 1993; 1995 X2   "stabilzation done; 1 on the right; 2 on the left"  . APPENDECTOMY  1989  . BACK SURGERY    . BREAST EXCISIONAL BIOPSY Left   . BREAST LUMPECTOMY Left    "benign tumor"  . CARDIAC CATHETERIZATION N/A 01/31/2015   Procedure: Left Heart Cath and Coronary Angiography;  Surgeon: Burnell Blanks, MD;  Location: [facility];  Service: Cardiovascular;  Laterality: N/A;  . CARDIAC CATHETERIZATION  2009; 2011  . COLONOSCOPY    . DILATION AND CURETTAGE OF UTERUS    . ESOPHAGOGASTRODUODENOSCOPY    . LAPAROSCOPIC CHOLECYSTECTOMY  1996  . NASAL RECONSTRUCTION  1976  . NASAL SINUS SURGERY  1975  . POSTERIOR LUMBAR FUSION  2005  . RADIOLOGY WITH ANESTHESIA N/A 01/15/2016   Procedure: MRI LUMBAR SPINE WITHOUT;  Surgeon: Medication Radiologist, MD;  Location: [facility];  Service: Radiology;  Laterality: N/A;  . RADIOLOGY WITH ANESTHESIA N/A 06/29/2016   Procedure: MRI OF THE BRAIN WITH AND WITHOUT;  Surgeon: Medication Radiologist, MD;  Location: [facility];  Service: Radiology;  Laterality: N/A;  . RENAL ANGIOGRAPHY N/A 12/03/2016   Procedure: Renal Angiography;  Surgeon: Angelia Mould, MD;  Location: [facility];  Service: Cardiovascular;  Laterality: N/A;  . TONSILLECTOMY  1992  . TUBAL LIGATION    . TUMOR EXCISION  1998   from back of skull; developed;  MRSA from the area that had to be packed    FAMILY HISTORY: Family History  Problem Relation Age of Onset  . Coronary artery disease Father   . Emphysema Father   . Heart attack Father   . Stroke Father   . Cancer Father        Unsure of type   . Allergic rhinitis Father     . Depression Mother   . Cancer Mother        skin cancer  . Bipolar disorder Brother   . Drug abuse Brother   . Cancer Brother        leukemia  . Bipolar disorder Daughter   .  Allergic rhinitis Daughter   . Asthma Daughter   . Urticaria Daughter   . Diabetes Maternal Grandmother   . Stomach cancer Maternal Grandfather   . Cancer Maternal Grandfather        stomach   . Angioedema Neg Hx   . Atopy Neg Hx   . Eczema Neg Hx   . Immunodeficiency Neg Hx   . Breast cancer Neg Hx     SOCIAL HISTORY:  Social History   Socioeconomic History  . Marital status: Divorced    Spouse name: Not on file  . Number of children: 3  . Years of education: Some college  . Highest education level: Not on file  Social Needs  . Financial resource strain: Not on file  . Food insecurity - worry: Not on file  . Food insecurity - inability: Not on file  . Transportation needs - medical: Not on file  . Transportation needs - non-medical: Not on file  Occupational History    Comment: Disability for back pain  Tobacco Use  . Smoking status: Current Every Day Smoker    Packs/day: 1.50    Years: 45.00    Pack years: 67.50    Types: Cigarettes    Start date: 03/04/1971  . Smokeless tobacco: Never Used  Substance and Sexual Activity  . Alcohol use: No    Alcohol/week: 0.0 oz  . Drug use: No  . Sexual activity: No    Birth control/protection: Surgical  Other Topics Concern  . Not on file  Social History Narrative   Lives at home with her daughter.   Right-handed.   1-2 cups coffee in the morning and 2 sodas per day.     PHYSICAL EXAM   Vitals:   08/08/17 0708  BP: (!) 199/113  Pulse: 90  Weight: 217 lb (98.4 kg)  Height: 5\' 6"  (1.676 m)    Not recorded      Body mass index is 35.02 kg/m.  PHYSICAL EXAMNIATION:    NEUROLOGICAL EXAM:  MENTAL STATUS: Speech:    Speech is normal; fluent and spontaneous with normal comprehension.  Cognition:     Orientation to time, place  and person     Normal recent and remote memory     Normal Attention span and concentration     Normal Language, naming, repeating,spontaneous speech     Fund of knowledge   CRANIAL NERVES: CN II: Visual fields are full to confrontation. Fundoscopic exam is normal with sharp discs and no vascular changes. Pupils are round equal and briskly reactive to light. CN III, IV, VI: extraocular movement are normal. No ptosis. CN V: Facial sensation is intact to pinprick in all 3 divisions bilaterally. Corneal responses are intact.  CN VII: Face is symmetric with normal eye closure and smile. CN VIII: Hearing is normal to rubbing fingers CN IX, X: Palate elevates symmetrically. Phonation is normal. CN XI: Head turning and shoulder shrug are intact CN XII: Tongue is midline with normal movements and no atrophy.  MOTOR: Variable effort on examinations, normal  REFLEXES: Reflexes are 2+ and symmetric at the biceps, triceps, knees, and ankles. Plantar responses are flexor.  SENSORY: Intact to light touch,    COORDINATION: No dysmetria noticed.  GAIT/STANCE: dragging left leg, but with variable effort  Romberg is absent.   DIAGNOSTIC DATA (LABS, IMAGING, TESTING) - I reviewed patient records, labs, notes, testing and imaging myself where available.   ASSESSMENT AND PLAN  Katrina [name] is a 57 y.o.  female   Cerebral small vessel disease  She does has multiple vascular risk factors of hypertension, smoke,   Continue Plavix.  The pattern of the supratentorium lesions, most consistent with small vessel disease, spinal fluid testing showed 0 oligoclonal bands, there is not enough evidence to support a diagnosis of multiple sclerosis.  Patient has variable effort on examinations   Marcial Pacas, M.D. Ph.D.  [facility] [address] [place], [place] [address] Ph: [phone] Fax: [phone]  CC: Berkley Harvey, NP

## 2017-08-09 ENCOUNTER — Encounter: Payer: Self-pay | Admitting: Neurology

## 2017-08-13 IMAGING — DX DG CHEST 2V
2 series · 2 of 2 positions shown · non-contrast
Comparison: 08/29/2015

CLINICAL DATA: Chest pain since yesterday.

EXAM:
CHEST  2 VIEW

[chest pa]
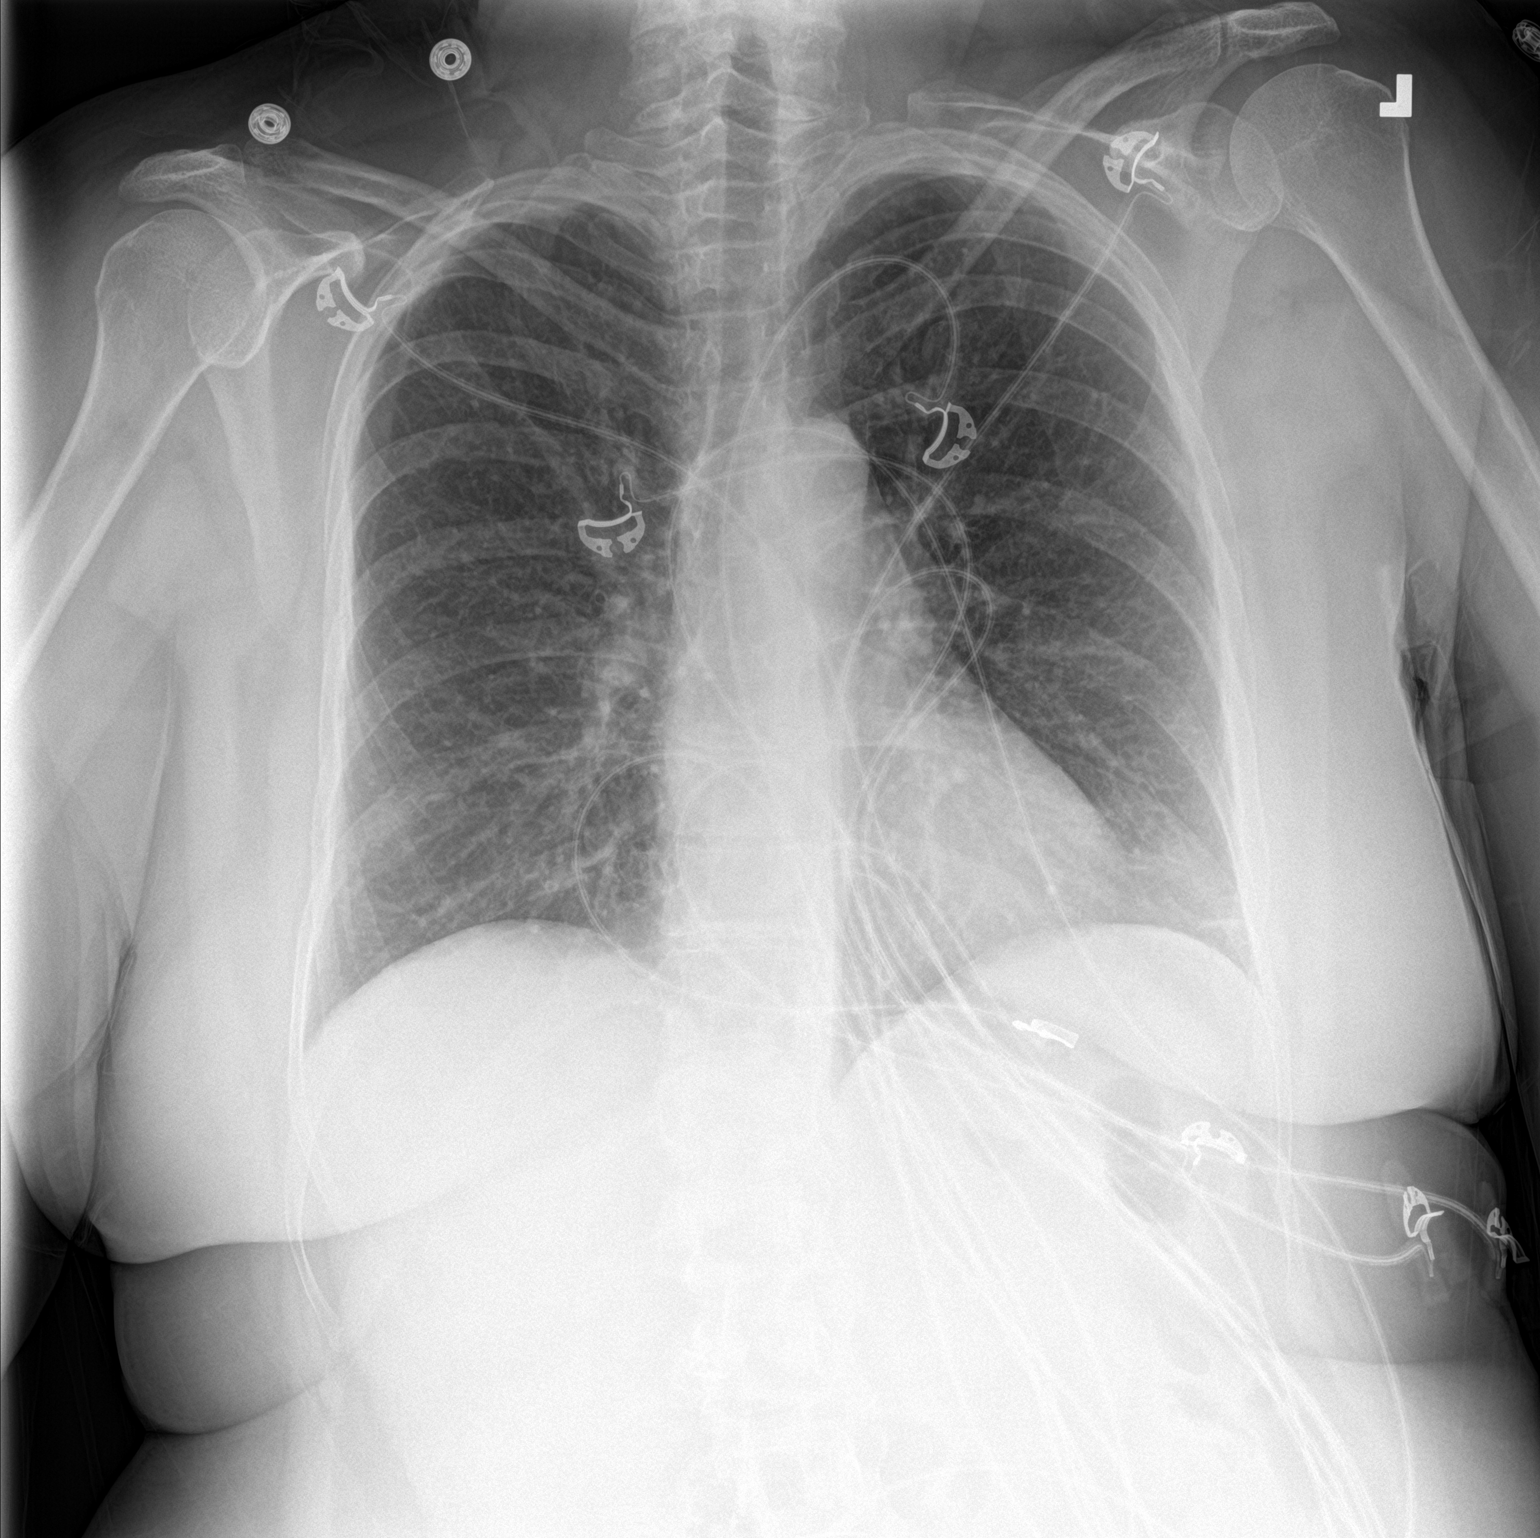

[chest lat]
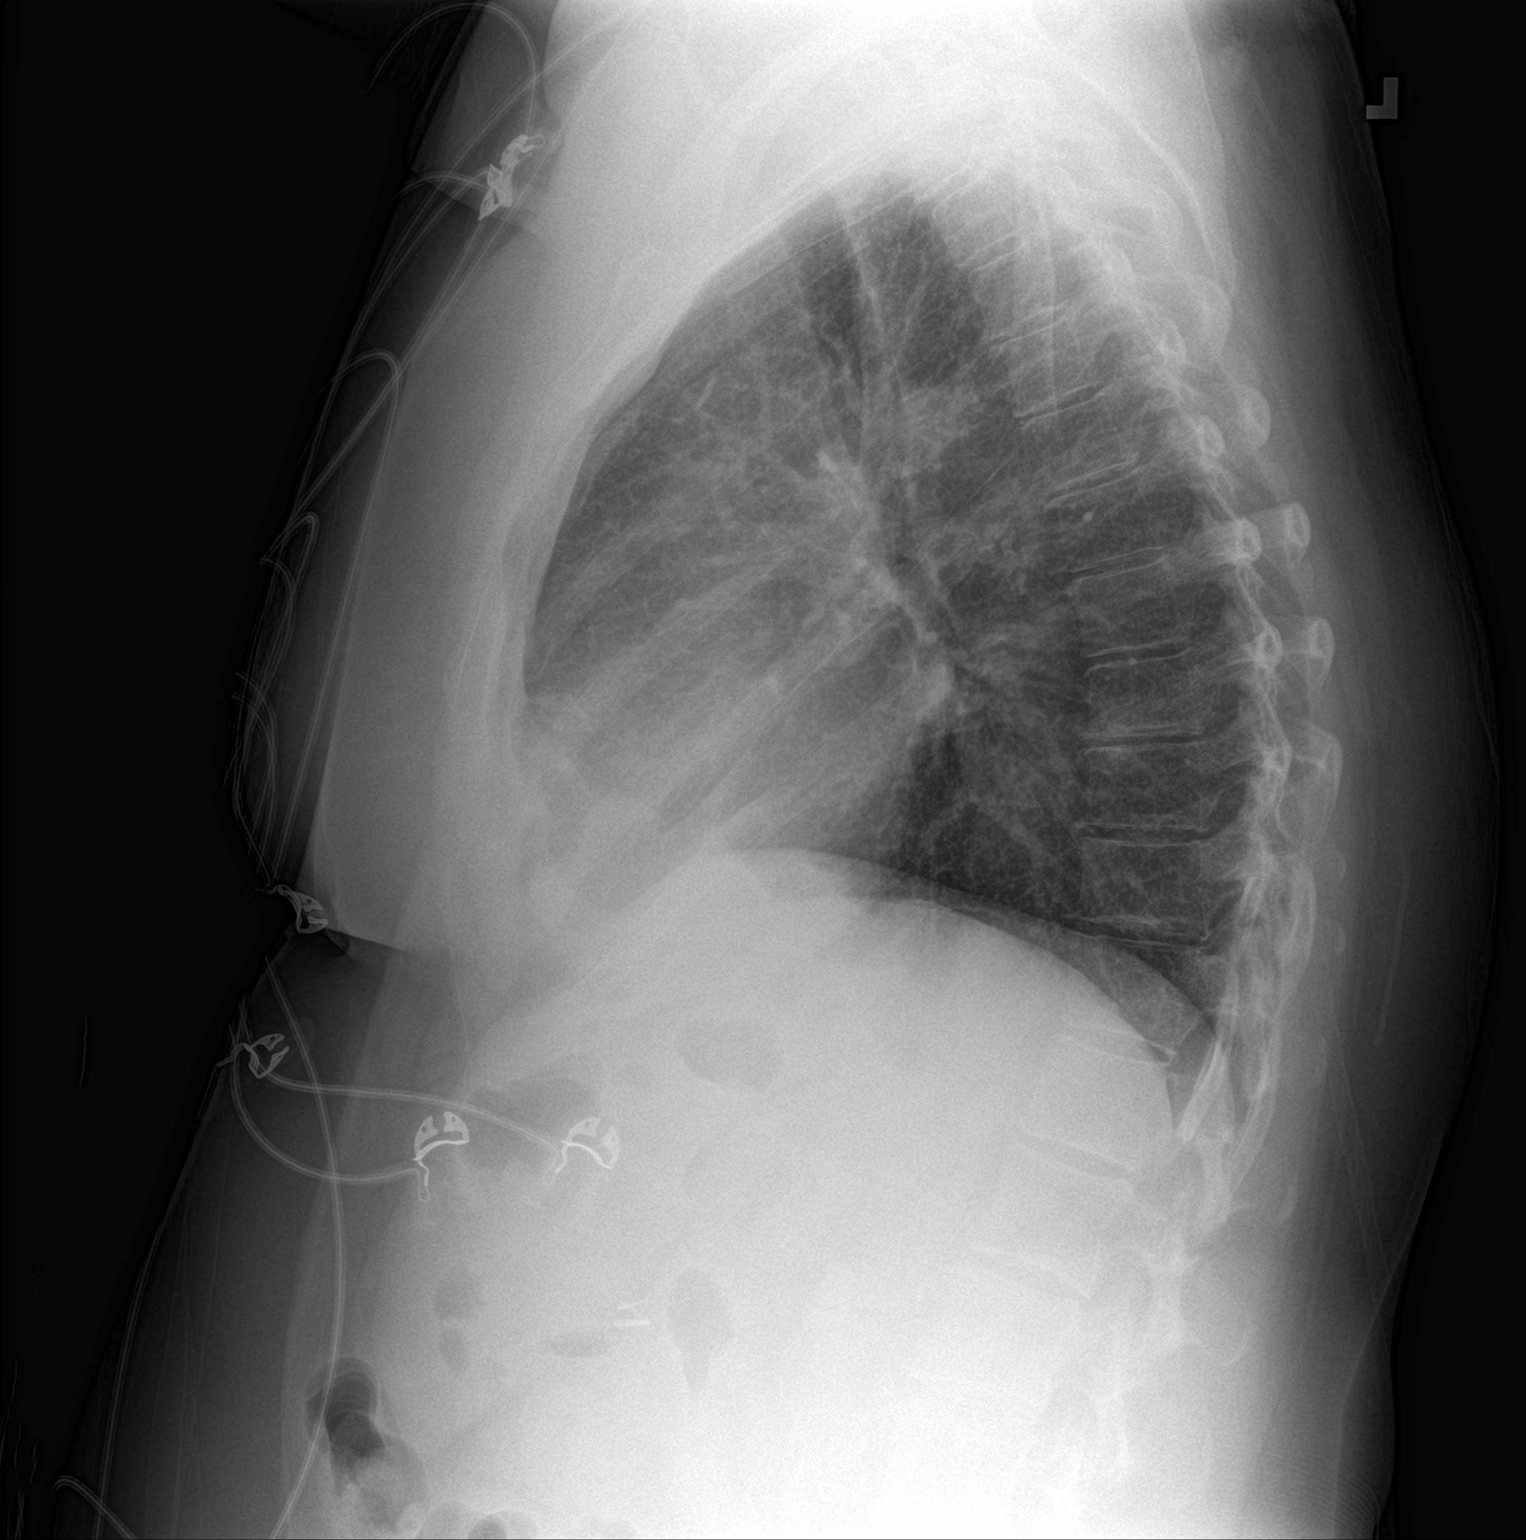

[2 of 2 positions shown; findings below may reference images not displayed]

FINDINGS: Persistent but improved bronchial thickening. The cardiomediastinal
contours are normal. Minimal scarring at the left lung base.
Pulmonary vasculature is normal. No consolidation, pleural effusion,
or pneumothorax. No acute osseous abnormalities are seen.
IMPRESSION: Improved bronchial thickening.  No new abnormality is seen.

## 2017-08-14 IMAGING — DX DG HIP (WITH OR WITHOUT PELVIS) 2-3V*L*
3 series · 3 of 3 positions shown · non-contrast
Comparison: None.

CLINICAL DATA: Acute onset of left groin pain.  Initial encounter.

EXAM:
DG HIP (WITH OR WITHOUT PELVIS) 2-3V LEFT

[hip ap]
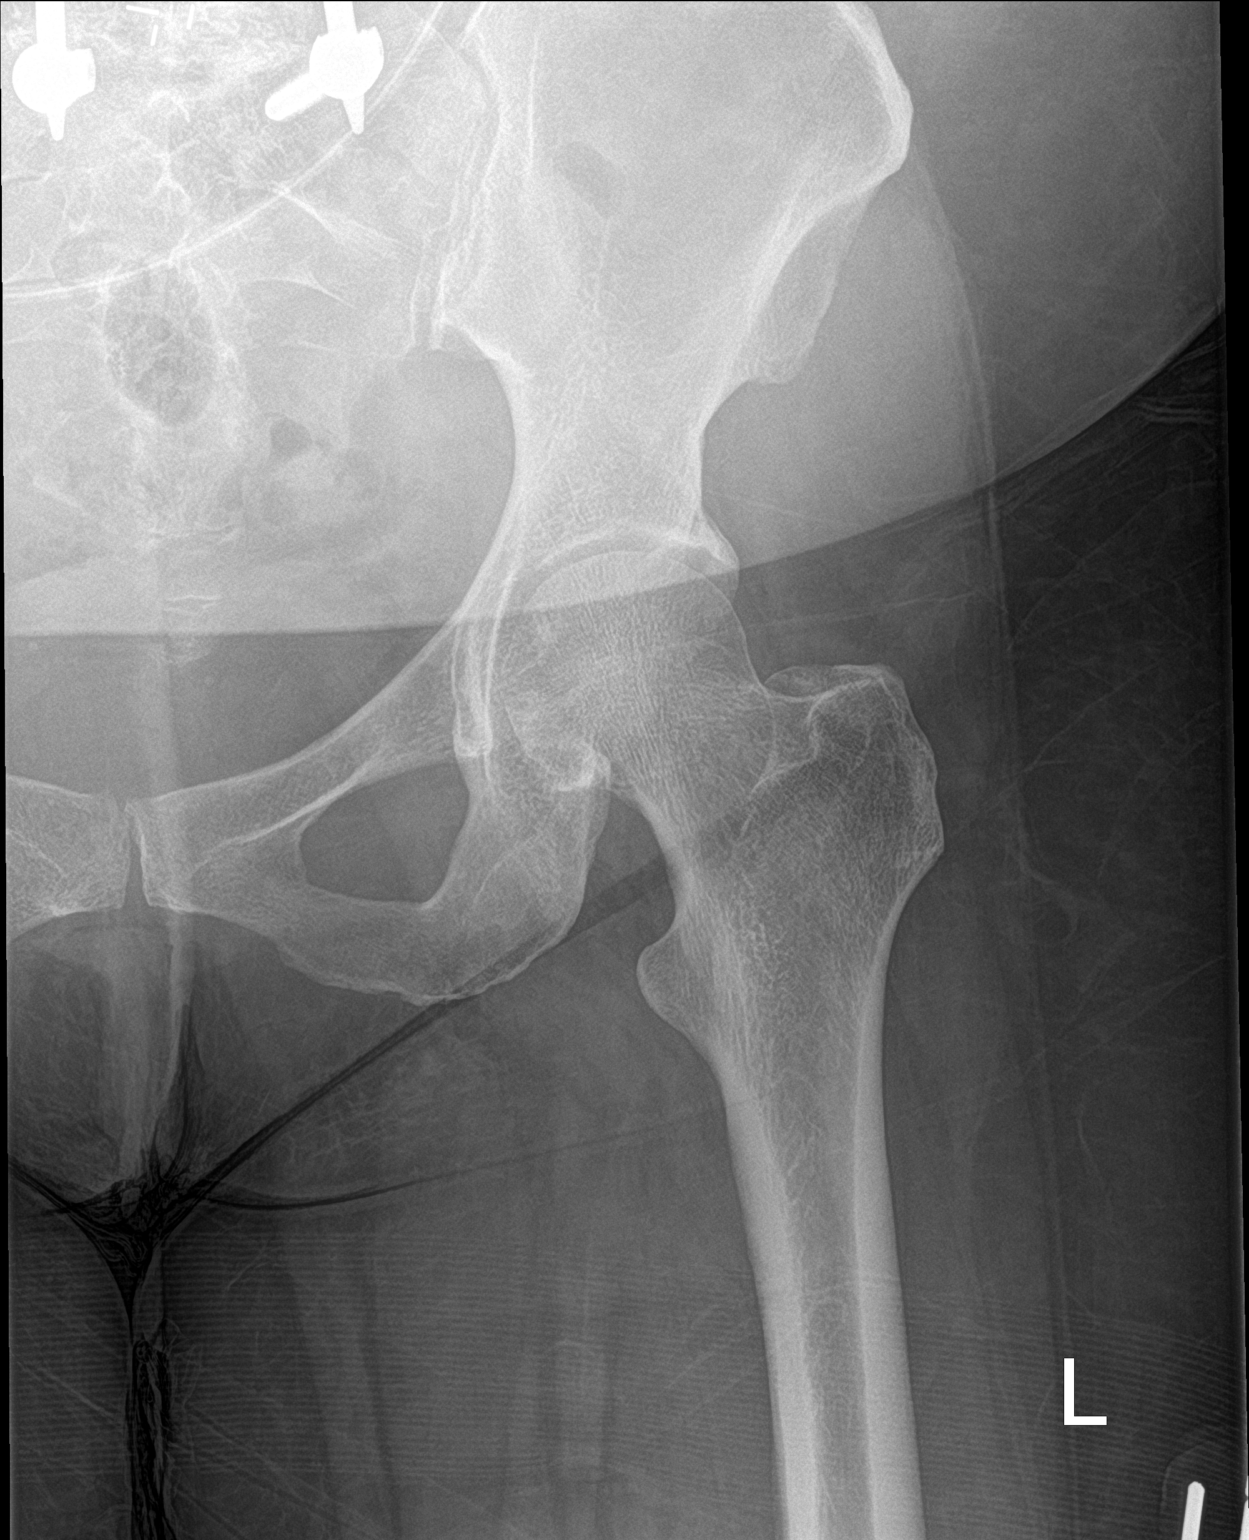

[hip lat]
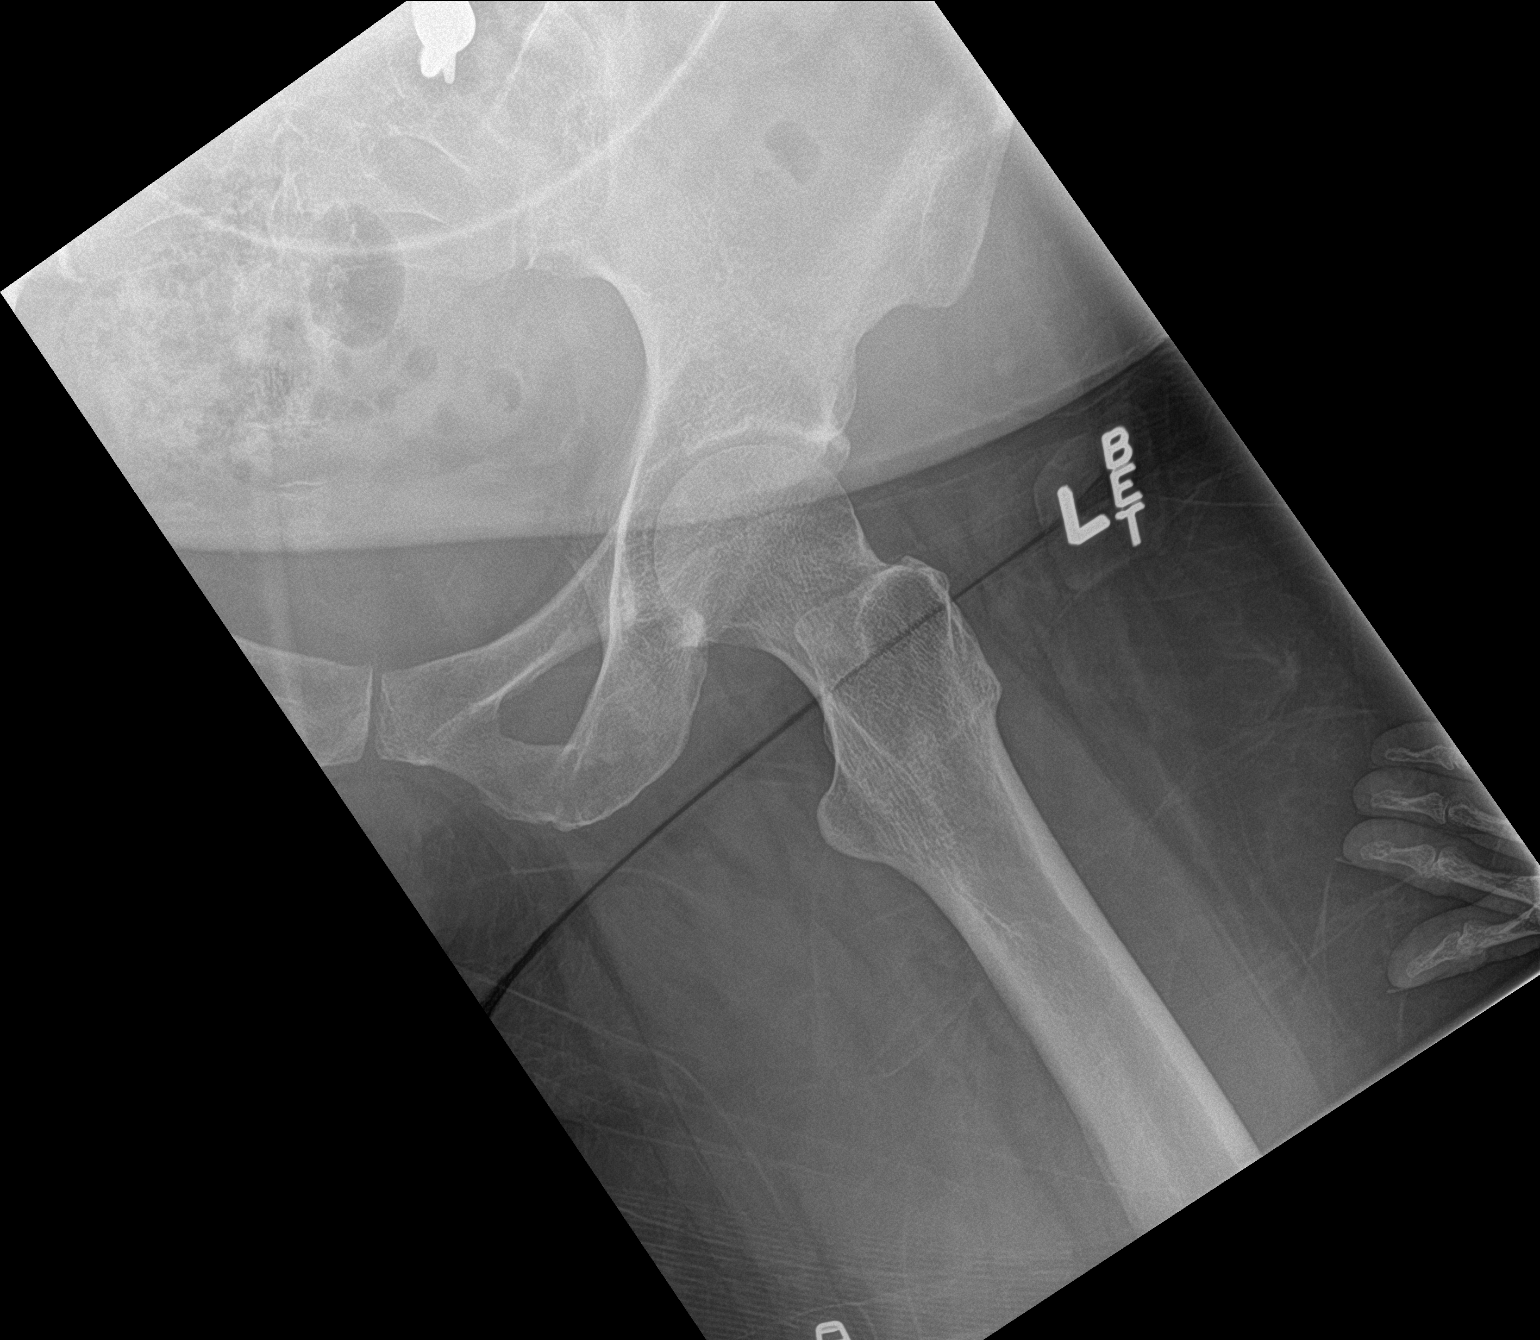

[pelvis ap]
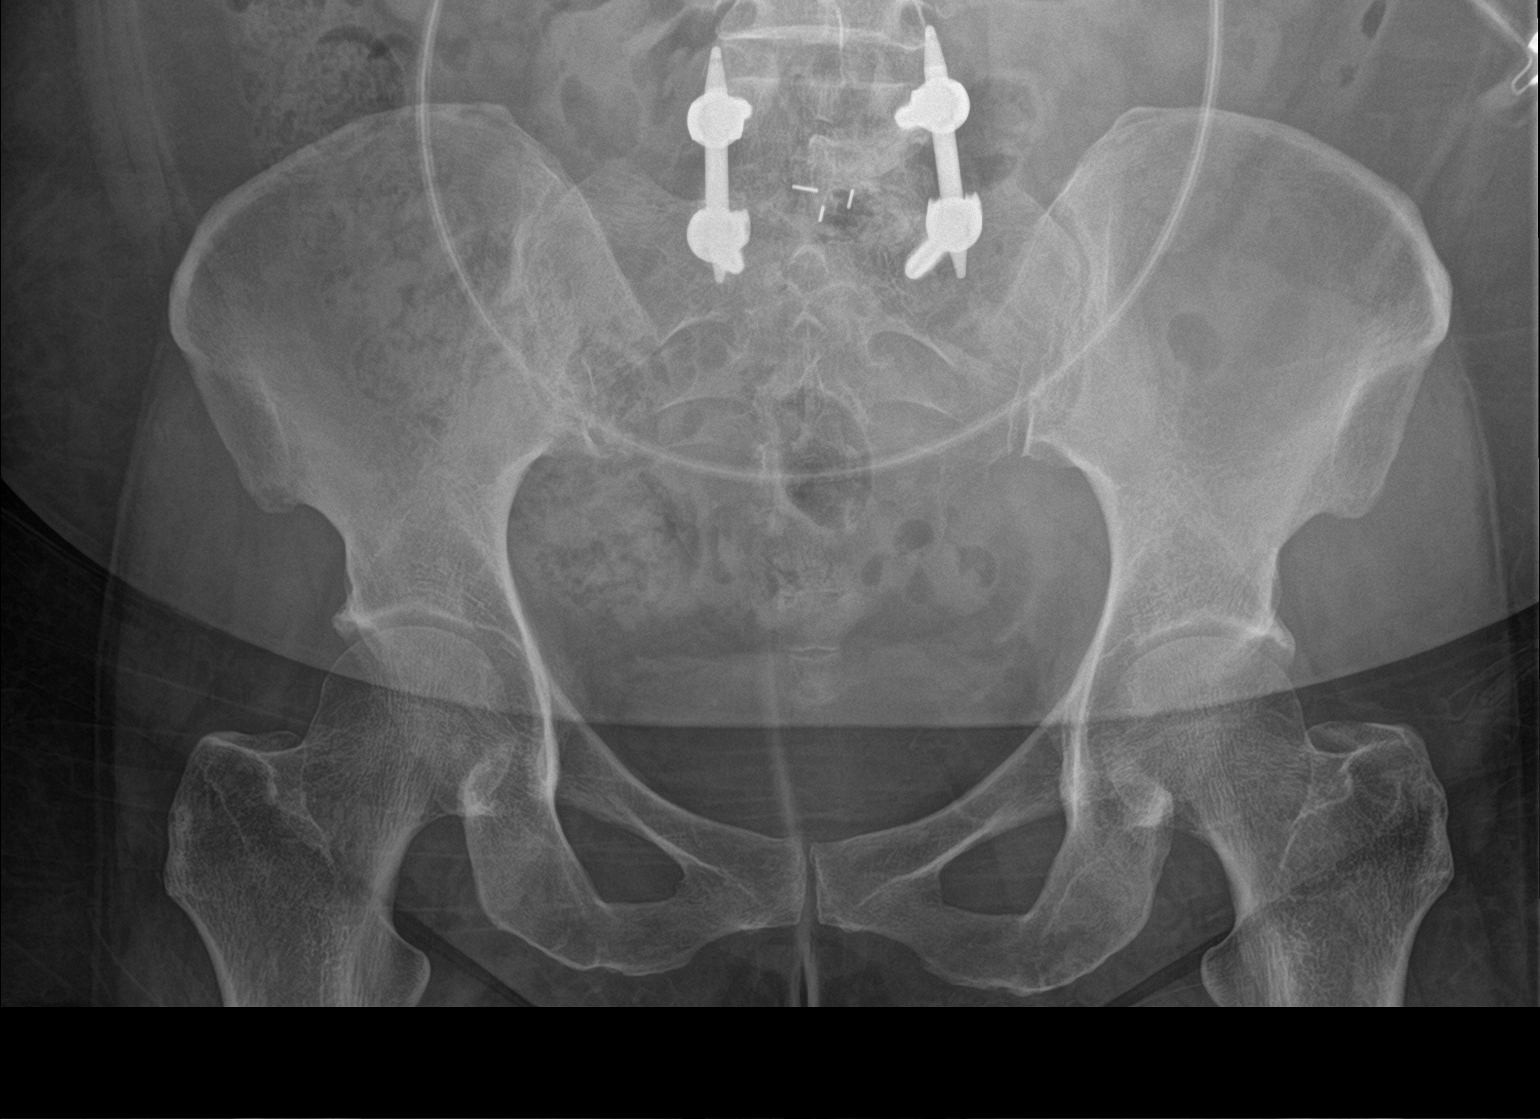

[3 of 3 positions shown; findings below may reference images not displayed]

FINDINGS: There is no evidence of fracture or dislocation. Both femoral heads
are seated normally within their respective acetabula. The proximal
left femur appears intact. Lumbosacral spinal fusion hardware is
noted. The sacroiliac joints are unremarkable in appearance.

The visualized bowel gas pattern is grossly unremarkable in
appearance.
IMPRESSION: No evidence of fracture or dislocation.

## 2017-08-20 ENCOUNTER — Observation Stay (HOSPITAL_COMMUNITY): Payer: Medicare Other

## 2017-08-20 ENCOUNTER — Observation Stay (HOSPITAL_COMMUNITY)
Admission: EM | Admit: 2017-08-20 | Discharge: 2017-08-23 | Disposition: A | Discharge status: Home / Self Care | Payer: Medicare Other | Attending: Internal Medicine | Admitting: Internal Medicine

## 2017-08-20 ENCOUNTER — Emergency Department (HOSPITAL_COMMUNITY): Payer: Medicare Other

## 2017-08-20 ENCOUNTER — Encounter (HOSPITAL_COMMUNITY): Payer: Self-pay | Admitting: Emergency Medicine

## 2017-08-20 DIAGNOSIS — R945 Abnormal results of liver function studies: Secondary | ICD-10-CM | POA: Diagnosis not present

## 2017-08-20 DIAGNOSIS — E669 Obesity, unspecified: Secondary | ICD-10-CM | POA: Insufficient documentation

## 2017-08-20 DIAGNOSIS — G2581 Restless legs syndrome: Secondary | ICD-10-CM | POA: Insufficient documentation

## 2017-08-20 DIAGNOSIS — R1013 Epigastric pain: Secondary | ICD-10-CM | POA: Insufficient documentation

## 2017-08-20 DIAGNOSIS — Z9114 Patient's other noncompliance with medication regimen: Secondary | ICD-10-CM | POA: Diagnosis not present

## 2017-08-20 DIAGNOSIS — R0789 Other chest pain: Secondary | ICD-10-CM | POA: Insufficient documentation

## 2017-08-20 DIAGNOSIS — N2889 Other specified disorders of kidney and ureter: Secondary | ICD-10-CM | POA: Diagnosis not present

## 2017-08-20 DIAGNOSIS — I252 Old myocardial infarction: Secondary | ICD-10-CM | POA: Diagnosis not present

## 2017-08-20 DIAGNOSIS — E1140 Type 2 diabetes mellitus with diabetic neuropathy, unspecified: Secondary | ICD-10-CM | POA: Insufficient documentation

## 2017-08-20 DIAGNOSIS — Z880 Allergy status to penicillin: Secondary | ICD-10-CM | POA: Insufficient documentation

## 2017-08-20 DIAGNOSIS — R1310 Dysphagia, unspecified: Secondary | ICD-10-CM | POA: Insufficient documentation

## 2017-08-20 DIAGNOSIS — I6789 Other cerebrovascular disease: Secondary | ICD-10-CM | POA: Diagnosis present

## 2017-08-20 DIAGNOSIS — Z8673 Personal history of transient ischemic attack (TIA), and cerebral infarction without residual deficits: Secondary | ICD-10-CM | POA: Insufficient documentation

## 2017-08-20 DIAGNOSIS — I161 Hypertensive emergency: Principal | ICD-10-CM | POA: Insufficient documentation

## 2017-08-20 DIAGNOSIS — I679 Cerebrovascular disease, unspecified: Secondary | ICD-10-CM | POA: Diagnosis not present

## 2017-08-20 DIAGNOSIS — I6930 Unspecified sequelae of cerebral infarction: Secondary | ICD-10-CM

## 2017-08-20 DIAGNOSIS — R519 Headache, unspecified: Secondary | ICD-10-CM

## 2017-08-20 DIAGNOSIS — R000 Tachycardia, unspecified: Secondary | ICD-10-CM | POA: Insufficient documentation

## 2017-08-20 DIAGNOSIS — I2510 Atherosclerotic heart disease of native coronary artery without angina pectoris: Secondary | ICD-10-CM | POA: Diagnosis not present

## 2017-08-20 DIAGNOSIS — R197 Diarrhea, unspecified: Secondary | ICD-10-CM | POA: Insufficient documentation

## 2017-08-20 DIAGNOSIS — I160 Hypertensive urgency: Secondary | ICD-10-CM | POA: Diagnosis present

## 2017-08-20 DIAGNOSIS — R112 Nausea with vomiting, unspecified: Secondary | ICD-10-CM

## 2017-08-20 DIAGNOSIS — I10 Essential (primary) hypertension: Secondary | ICD-10-CM | POA: Diagnosis not present

## 2017-08-20 DIAGNOSIS — Z8249 Family history of ischemic heart disease and other diseases of the circulatory system: Secondary | ICD-10-CM | POA: Insufficient documentation

## 2017-08-20 DIAGNOSIS — Z823 Family history of stroke: Secondary | ICD-10-CM | POA: Insufficient documentation

## 2017-08-20 DIAGNOSIS — M545 Low back pain, unspecified: Secondary | ICD-10-CM | POA: Diagnosis present

## 2017-08-20 DIAGNOSIS — F411 Generalized anxiety disorder: Secondary | ICD-10-CM | POA: Diagnosis not present

## 2017-08-20 DIAGNOSIS — R079 Chest pain, unspecified: Secondary | ICD-10-CM | POA: Diagnosis not present

## 2017-08-20 DIAGNOSIS — G8929 Other chronic pain: Secondary | ICD-10-CM | POA: Diagnosis not present

## 2017-08-20 DIAGNOSIS — Z8614 Personal history of Methicillin resistant Staphylococcus aureus infection: Secondary | ICD-10-CM | POA: Insufficient documentation

## 2017-08-20 DIAGNOSIS — Z66 Do not resuscitate: Secondary | ICD-10-CM | POA: Insufficient documentation

## 2017-08-20 DIAGNOSIS — Z79899 Other long term (current) drug therapy: Secondary | ICD-10-CM | POA: Insufficient documentation

## 2017-08-20 DIAGNOSIS — I151 Hypertension secondary to other renal disorders: Secondary | ICD-10-CM | POA: Diagnosis not present

## 2017-08-20 DIAGNOSIS — Z6835 Body mass index (BMI) 35.0-35.9, adult: Secondary | ICD-10-CM | POA: Insufficient documentation

## 2017-08-20 DIAGNOSIS — F17210 Nicotine dependence, cigarettes, uncomplicated: Secondary | ICD-10-CM | POA: Diagnosis not present

## 2017-08-20 DIAGNOSIS — G43909 Migraine, unspecified, not intractable, without status migrainosus: Secondary | ICD-10-CM | POA: Insufficient documentation

## 2017-08-20 DIAGNOSIS — F329 Major depressive disorder, single episode, unspecified: Secondary | ICD-10-CM | POA: Insufficient documentation

## 2017-08-20 DIAGNOSIS — J449 Chronic obstructive pulmonary disease, unspecified: Secondary | ICD-10-CM | POA: Insufficient documentation

## 2017-08-20 DIAGNOSIS — R51 Headache: Secondary | ICD-10-CM | POA: Diagnosis present

## 2017-08-20 DIAGNOSIS — K219 Gastro-esophageal reflux disease without esophagitis: Secondary | ICD-10-CM | POA: Insufficient documentation

## 2017-08-20 DIAGNOSIS — R072 Precordial pain: Secondary | ICD-10-CM

## 2017-08-20 DIAGNOSIS — Z7902 Long term (current) use of antithrombotics/antiplatelets: Secondary | ICD-10-CM | POA: Diagnosis not present

## 2017-08-20 LAB — HEPATIC FUNCTION PANEL
ALBUMIN: 3.6 g/dL (ref 3.5–5.0)
ALK PHOS: 68 U/L (ref 38–126)
ALT: 17 U/L (ref 14–54)
AST: 23 U/L (ref 15–41)
BILIRUBIN INDIRECT: 0.3 mg/dL (ref 0.3–0.9)
Bilirubin, Direct: 0.1 mg/dL (ref 0.1–0.5)
Total Bilirubin: 0.4 mg/dL (ref 0.3–1.2)
Total Protein: 6.7 g/dL (ref 6.5–8.1)

## 2017-08-20 LAB — URINALYSIS, ROUTINE W REFLEX MICROSCOPIC
BILIRUBIN URINE: NEGATIVE
Glucose, UA: NEGATIVE mg/dL
KETONES UR: NEGATIVE mg/dL
Leukocytes, UA: NEGATIVE
NITRITE: NEGATIVE
Protein, ur: NEGATIVE mg/dL
Specific Gravity, Urine: <1.005 — ABNORMAL LOW (ref 1.005–1.030)
pH: 7 (ref 5.0–8.0)

## 2017-08-20 LAB — RAPID URINE DRUG SCREEN, HOSP PERFORMED
Amphetamines: NOT DETECTED
BARBITURATES: NOT DETECTED
Benzodiazepines: NOT DETECTED
Cocaine: NOT DETECTED
OPIATES: POSITIVE — AB
Tetrahydrocannabinol: NOT DETECTED

## 2017-08-20 LAB — CBC
HEMATOCRIT: 44.3 % (ref 36.0–46.0)
HEMOGLOBIN: 14.3 g/dL (ref 12.0–15.0)
MCH: 29.2 pg (ref 26.0–34.0)
MCHC: 32.3 g/dL (ref 30.0–36.0)
MCV: 90.4 fL (ref 78.0–100.0)
Platelets: 303 10*3/uL (ref 150–400)
RBC: 4.9 MIL/uL (ref 3.87–5.11)
RDW: 14.7 % (ref 11.5–15.5)
WBC: 10.5 10*3/uL (ref 4.0–10.5)

## 2017-08-20 LAB — BASIC METABOLIC PANEL
ANION GAP: 12 (ref 5–15)
BUN: 14 mg/dL (ref 6–20)
CALCIUM: 9.1 mg/dL (ref 8.9–10.3)
CO2: 23 mmol/L (ref 22–32)
Chloride: 106 mmol/L (ref 101–111)
Creatinine, Ser: 0.8 mg/dL (ref 0.44–1.00)
Glucose, Bld: 89 mg/dL (ref 65–99)
POTASSIUM: 3.8 mmol/L (ref 3.5–5.1)
Sodium: 141 mmol/L (ref 135–145)

## 2017-08-20 LAB — URINALYSIS, MICROSCOPIC (REFLEX)
Bacteria, UA: NONE SEEN
WBC, UA: NONE SEEN WBC/hpf (ref 0–5)

## 2017-08-20 LAB — I-STAT TROPONIN, ED
TROPONIN I, POC: 0.01 ng/mL (ref 0.00–0.08)
TROPONIN I, POC: 0 ng/mL (ref 0.00–0.08)

## 2017-08-20 LAB — TROPONIN I: Troponin I: <0.03 ng/mL (ref <0.03)

## 2017-08-20 LAB — LIPASE, BLOOD: LIPASE: 22 U/L (ref 11–51)

## 2017-08-20 LAB — I-STAT BETA HCG BLOOD, ED (MC, WL, AP ONLY): HCG, QUANTITATIVE: 13.7 m[IU]/mL — AB (ref <5)

## 2017-08-20 MED ORDER — PANTOPRAZOLE SODIUM 40 MG PO TBEC
40.0000 mg | DELAYED_RELEASE_TABLET | Freq: Every day | ORAL | Status: DC
  Administered 2017-08-20 – 2017-08-22 (×3): 40 mg via ORAL
  Filled 2017-08-20 (×4): qty 1

## 2017-08-20 MED ORDER — GI COCKTAIL ~~LOC~~
30.0000 mL | Freq: Four times a day (QID) | ORAL | Status: DC | PRN
  Administered 2017-08-21: 30 mL via ORAL
  Filled 2017-08-20: qty 30

## 2017-08-20 MED ORDER — HYDRALAZINE HCL 20 MG/ML IJ SOLN
10.0000 mg | Freq: Four times a day (QID) | INTRAMUSCULAR | Status: DC | PRN
  Administered 2017-08-20 – 2017-08-22 (×4): 10 mg via INTRAVENOUS
  Filled 2017-08-20 (×4): qty 1

## 2017-08-20 MED ORDER — ONDANSETRON HCL 4 MG/2ML IJ SOLN
4.0000 mg | Freq: Four times a day (QID) | INTRAMUSCULAR | Status: DC | PRN
  Administered 2017-08-21: 4 mg via INTRAVENOUS
  Filled 2017-08-20: qty 2

## 2017-08-20 MED ORDER — ACETAMINOPHEN 325 MG PO TABS: 650.0000 mg | ORAL_TABLET | ORAL | Status: DC | PRN

## 2017-08-20 MED ORDER — ZOLPIDEM TARTRATE 10 MG PO TABS
5.0000 mg | ORAL_TABLET | Freq: Every evening | ORAL | Status: DC | PRN
  Administered 2017-08-21 – 2017-08-22 (×2): 5 mg via ORAL
  Filled 2017-08-20 (×2): qty 1

## 2017-08-20 MED ORDER — NITROGLYCERIN 0.4 MG SL SUBL: 0.4000 mg | SUBLINGUAL_TABLET | SUBLINGUAL | Status: DC | PRN

## 2017-08-20 MED ORDER — ROPINIROLE HCL 1 MG PO TABS
4.0000 mg | ORAL_TABLET | Freq: Every day | ORAL | Status: DC
  Administered 2017-08-20 – 2017-08-22 (×3): 4 mg via ORAL
  Filled 2017-08-20 (×3): qty 4

## 2017-08-20 MED ORDER — ATENOLOL 50 MG PO TABS
50.0000 mg | ORAL_TABLET | Freq: Every day | ORAL | Status: DC
  Administered 2017-08-21 – 2017-08-22 (×2): 50 mg via ORAL
  Filled 2017-08-20 (×3): qty 1

## 2017-08-20 MED ORDER — TIOTROPIUM BROMIDE MONOHYDRATE 18 MCG IN CAPS
18.0000 ug | ORAL_CAPSULE | Freq: Every day | RESPIRATORY_TRACT | Status: DC
  Filled 2017-08-20: qty 5

## 2017-08-20 MED ORDER — CLOPIDOGREL BISULFATE 75 MG PO TABS
75.0000 mg | ORAL_TABLET | Freq: Every day | ORAL | Status: DC
  Administered 2017-08-21 – 2017-08-22 (×2): 75 mg via ORAL
  Filled 2017-08-20 (×3): qty 1

## 2017-08-20 MED ORDER — AMLODIPINE BESYLATE 10 MG PO TABS
10.0000 mg | ORAL_TABLET | Freq: Every day | ORAL | Status: DC
  Administered 2017-08-21 – 2017-08-22 (×2): 10 mg via ORAL
  Filled 2017-08-20 (×3): qty 1

## 2017-08-20 MED ORDER — FLUTICASONE PROPIONATE HFA 220 MCG/ACT IN AERO: 2.0000 | INHALATION_SPRAY | Freq: Two times a day (BID) | RESPIRATORY_TRACT | Status: DC

## 2017-08-20 MED ORDER — SPIRONOLACTONE 25 MG PO TABS
100.0000 mg | ORAL_TABLET | Freq: Every evening | ORAL | Status: DC
  Administered 2017-08-21: 100 mg via ORAL
  Filled 2017-08-20 (×3): qty 4

## 2017-08-20 MED ORDER — LORAZEPAM 2 MG/ML IJ SOLN
2.0000 mg | Freq: Once | INTRAMUSCULAR | Status: AC
  Administered 2017-08-20: 2 mg via INTRAVENOUS
  Filled 2017-08-20: qty 1

## 2017-08-20 MED ORDER — HYDRALAZINE HCL 50 MG PO TABS
50.0000 mg | ORAL_TABLET | Freq: Three times a day (TID) | ORAL | Status: DC
Start: 2017-08-20 — End: 2017-08-23
  Administered 2017-08-21 – 2017-08-22 (×5): 50 mg via ORAL
  Filled 2017-08-20 (×7): qty 1

## 2017-08-20 MED ORDER — SODIUM CHLORIDE 0.9 % IV BOLUS (SEPSIS)
500.0000 mL | Freq: Once | INTRAVENOUS | Status: AC
  Administered 2017-08-20: 500 mL via INTRAVENOUS

## 2017-08-20 MED ORDER — MORPHINE SULFATE (PF) 4 MG/ML IV SOLN
4.0000 mg | Freq: Once | INTRAVENOUS | Status: AC
  Administered 2017-08-20: 4 mg via INTRAVENOUS
  Filled 2017-08-20: qty 1

## 2017-08-20 MED ORDER — BUDESONIDE 0.5 MG/2ML IN SUSP
1.0000 mg | Freq: Two times a day (BID) | RESPIRATORY_TRACT | Status: DC
  Filled 2017-08-20 (×3): qty 4

## 2017-08-20 MED ORDER — ALPRAZOLAM 0.25 MG PO TABS
0.2500 mg | ORAL_TABLET | Freq: Two times a day (BID) | ORAL | Status: DC | PRN
  Administered 2017-08-20 – 2017-08-21 (×2): 0.25 mg via ORAL
  Filled 2017-08-20 (×2): qty 1

## 2017-08-20 MED ORDER — LOSARTAN POTASSIUM 50 MG PO TABS
50.0000 mg | ORAL_TABLET | Freq: Two times a day (BID) | ORAL | Status: DC
  Administered 2017-08-21 – 2017-08-22 (×4): 50 mg via ORAL
  Filled 2017-08-20 (×6): qty 1

## 2017-08-20 MED ORDER — ONDANSETRON HCL 4 MG/2ML IJ SOLN
4.0000 mg | Freq: Once | INTRAMUSCULAR | Status: AC
  Administered 2017-08-20: 4 mg via INTRAVENOUS
  Filled 2017-08-20: qty 2

## 2017-08-20 MED ORDER — GABAPENTIN 300 MG PO CAPS
600.0000 mg | ORAL_CAPSULE | Freq: Four times a day (QID) | ORAL | Status: DC
  Administered 2017-08-20 – 2017-08-23 (×9): 600 mg via ORAL
  Filled 2017-08-20 (×9): qty 2

## 2017-08-20 MED ORDER — ENOXAPARIN SODIUM 40 MG/0.4ML ~~LOC~~ SOLN
40.0000 mg | SUBCUTANEOUS | Status: DC
  Filled 2017-08-20 (×2): qty 0.4

## 2017-08-20 MED ORDER — MORPHINE SULFATE (PF) 4 MG/ML IV SOLN
2.0000 mg | INTRAVENOUS | Status: DC | PRN
  Administered 2017-08-20 – 2017-08-21 (×4): 2 mg via INTRAVENOUS
  Filled 2017-08-20 (×4): qty 1

## 2017-08-20 MED ORDER — CHLORTHALIDONE 25 MG PO TABS
25.0000 mg | ORAL_TABLET | Freq: Two times a day (BID) | ORAL | Status: DC
  Administered 2017-08-21 – 2017-08-22 (×3): 25 mg via ORAL
  Filled 2017-08-20 (×6): qty 1

## 2017-08-20 MED ORDER — ONDANSETRON HCL 4 MG/2ML IJ SOLN: 4.0000 mg | Freq: Four times a day (QID) | INTRAMUSCULAR | Status: DC | PRN

## 2017-08-20 NOTE — ED Notes (Signed)
Bed: WA04 Expected date:  Expected time:  Means of arrival:  Comments: Katrina [name]

## 2017-08-20 NOTE — ED Notes (Signed)
Patient transported to X-ray 

## 2017-08-20 NOTE — ED Notes (Signed)
Pt transported to CT at this time.

## 2017-08-20 NOTE — ED Notes (Signed)
Prior to sending pt to MRI, she was very anxious but said she would try to do it.  She was given the ativan 2mg  IV and said that wouldn't make a difference in her anxiety.  She was given guided visualization and deep breathing techniques to use in the event she started to feel anxious in the MRI machine.  She was able to tolerate the MRI but came back shaky.  She was again given reassurance and assisted in calming down.

## 2017-08-20 NOTE — Progress Notes (Signed)
Patient admitted to room 1403 from ED. Patient refuses to sit in bed, said she is not going to be tied up in that bed.Bp 181/124, patient said is normal for her, refusing all schedule bp meds. Said she had all her meds this morning. Refusing prn hydralazine because she just got it from ED. Saying she will leave if she did not eat. Do not want sandwich that was offered, said she will not comply with heart heathy diet. She called daughter to order pizza. Concerned patient may try to leave. Notified staff  to be vigilant.Refuses lab drawn and resp tx. Saying she will leave AMA.

## 2017-08-20 NOTE — ED Provider Notes (Signed)
Katrina [name]   CSN: [id number] Arrival date & time: 08/20/17  1132     History   Chief Complaint Chief Complaint  Patient presents with  . Chest Pain  . Hypertension    HPI Katrina [name] is a 57 y.o. female w PMHx HTN, COPD, CKD, T2DM, CVA (w residual LUE and LLE weakness as well as left eye vision loss), presenting to the ED with multiple complaints. Pt states she began having intermittent chest pain yesterday, described as a stabbing pain in the left chest. She states she also has left sided HA that feels like a pressure, with assoc blurry vision. She states she feels mildly SOB as well, and has had N/V/D since yesterday. Reports epigastric abdominal pain. Reports the HA and chest pain have accompanied her high BP in the past, but not the N/V/D. Denies F/C, urinary sx, new weakness or numbness, slurred speech. Has taken all of her medications this morning and states she has not vomited them. Of [name], pt takes plavix, amlodipine, hydralazine, atenolol, spironolactone, losartan, propranolol.  Per chart review, pt has echo done December 2018, with EF 65-70%  The history is provided by the patient.    Past Medical History:  Diagnosis Date  . Allergy   . Anemia 1975-1976   . Anginal pain ([place])   . Anxiety    takes Valium daily as needed  . Arthritis    "spine" (12/03/2016)  . Asthma    has inhalers but doesn't use (12/03/2016)  . CAD (coronary artery disease)    Moderate nonobstructive 2012-2013, [place]; No angiographic evidence of CAD 01/31/15 [facility]  . Chronic bronchitis ([place])    "get it a couple times q yr" (12/03/2016)  . Chronic kidney disease   . Chronic lower back pain    budlging disc   . Claustrophobia   . COPD (chronic obstructive pulmonary disease) ([place])   . Daily headache   . Depression   . Diverticulitis   . Family history of adverse reaction to anesthesia    2 daughters gets extremely sick   . GERD  (gastroesophageal reflux disease)    takes Dexilant daily  . Heart murmur   . History of blood transfusion 1975-1976 "several"   no abnormal reaction noted  . History of colitis   . History of colon polyps    benign  . History of gastric ulcer   . History of hiatal hernia    "small one"  . History of MRSA infection 2017  . Hyperlipidemia    was on meds but has been off over a yr  . Hypertension    takes Amlodipine and Maxzide daily  . Insomnia    takes Ambien nightly  . Iron deficiency anemia    "when I was young"  . Lung nodules   . Microscopic hematuria   . Migraine    "2-3/wk" (12/03/2016)  . MS (multiple sclerosis) ([place])    questionable per pt  . Myocardial infarction ([place]) 2000   "mild one"  . Noncompliance   . Osteoporosis   . Peripheral neuropathy    weakness,numbness,and tingling. Takes Gabapentin daily  . Pneumonia 06/2015  . PTSD (post-traumatic stress disorder) dx'd 2016/2017   "was dx'd w/bipolar in 1991; replaced w/PTSD dx 2016/2017" (12/03/2016)  . Restless leg syndrome    takes Requip daily  . 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  Type II diabetes mellitus ([place])    "went off insulin in 2012/2013" (12/03/2016)    Patient Active Problem List   Diagnosis Date Noted  . Chest pain in adult 08/20/2017  . Abnormal brain MRI 08/08/2017  . Weakness of left leg 05/19/2017  . Hypertension 12/03/2016  . Adverse food reaction 10/14/2016  . Pulmonary nodules 10/14/2016  . Non-seasonal allergic rhinitis due to fungal spores 10/14/2016  . Cigarette smoker 09/24/2016  . Cerebral microvascular disease 08/19/2016  . Non compliance with medical treatment 05/11/2016  . Leg pain, anterior, right 05/11/2016  . Syncope 05/02/2016  . Type 2 diabetes mellitus with diabetic neuropathy ([place]) 05/02/2016  . Restless leg syndrome 05/02/2016  . History of CVA with residual deficit   . Vascular  headache   . History of chest pain   . Generalized anxiety disorder   . Chronic bilateral low back pain without sciatica   . Peripheral neuropathy   . Migraine without status migrainosus, not intractable   . PTSD (post-traumatic stress disorder)   . History of cerebrovascular accident (CVA) with residual deficit   . CVA (cerebral vascular accident) ([place]) 04/03/2016  . Stroke (cerebrum) ([place]) 04/03/2016  . Post traumatic stress disorder 10/06/2015  . Precordial pain   . Migraine syndrome 01/24/2015  . Migraine 01/24/2015  . TIA (transient ischemic attack) 12/25/2014  . Hypertensive urgency   . Atypical chest pain   . Malignant hypertension   . Hypokalemia 04/16/2014  . Muscle weakness (generalized) 04/05/2014  . Stiffness of left hip joint 04/05/2014  . Pain in left hip 04/05/2014  . Diastolic dysfunction 63/87/5643  . Normal coronary arteries 03/20/2014  . PUD (peptic ulcer disease) 03/20/2014  . Type 2 diabetes, uncontrolled, with neuropathy ([place]) 03/20/2014  . Morbid obesity due to excess calories ([place]) 03/20/2014  . Left-sided weakness 03/20/2014  . Paresthesias 03/20/2014  . Cerebrovascular disease 03/20/2014  . Dyspnea on exertion 03/05/2012  . Tobacco abuse 03/05/2012  . Moderate COPD (chronic obstructive pulmonary disease) ([place]) 11/23/2011  . GERD (gastroesophageal reflux disease) 11/23/2011  . Uncontrolled hypertension 11/23/2011    Past Surgical History:  Procedure Laterality Date  . ABDOMINAL AORTOGRAM N/A 12/03/2016   Procedure: Abdominal Aortogram;  Surgeon: Angelia Mould, MD;  Location: [place] [facility];  Service: Cardiovascular;  Laterality: N/A;  . ABDOMINAL HYSTERECTOMY  12/1987  . ADENOIDECTOMY  1975  . ANKLE SURGERY Bilateral 1993; 1995 X2   "stabilzation done; 1 on the right; 2 on the left"  . APPENDECTOMY  1989  . BACK SURGERY    . BREAST EXCISIONAL BIOPSY Left   . BREAST LUMPECTOMY Left    "benign tumor"  . CARDIAC CATHETERIZATION N/A  01/31/2015   Procedure: Left Heart Cath and Coronary Angiography;  Surgeon: Burnell Blanks, MD;  Location: [place] [facility];  Service: Cardiovascular;  Laterality: N/A;  . CARDIAC CATHETERIZATION  2009; 2011  . COLONOSCOPY    . DILATION AND CURETTAGE OF UTERUS    . ESOPHAGOGASTRODUODENOSCOPY    . LAPAROSCOPIC CHOLECYSTECTOMY  1996  . NASAL RECONSTRUCTION  1976  . NASAL SINUS SURGERY  1975  . POSTERIOR LUMBAR FUSION  2005  . RADIOLOGY WITH ANESTHESIA N/A 01/15/2016   Procedure: MRI LUMBAR SPINE WITHOUT;  Surgeon: Medication Radiologist, MD;  Location: [place];  Service: Radiology;  Laterality: N/A;  . RADIOLOGY WITH ANESTHESIA N/A 06/29/2016   Procedure: MRI OF THE BRAIN WITH AND WITHOUT;  Surgeon: Medication Radiologist, MD;  Location: [place];  Service: Radiology;  Laterality:  N/A;  . RENAL ANGIOGRAPHY N/A 12/03/2016   Procedure: Renal Angiography;  Surgeon: Angelia Mould, MD;  Location: [place] [facility];  Service: Cardiovascular;  Laterality: N/A;  . TONSILLECTOMY  1992  . TUBAL LIGATION    . 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 Authorizing Provider  amLODipine (NORVASC) 10 MG tablet Take 1 tablet (10 mg total) by mouth daily. 06/05/14  Yes Herminio Commons, MD  atenolol (TENORMIN) 50 MG tablet Take 1 tablet (50 mg total) by mouth daily. 05/02/17  Yes Herminio Commons, MD  chlorthalidone (HYGROTON) 25 MG tablet Take 25 mg by mouth 2 (two) times daily. 06/25/16 06/24/18 Yes [provider]  clopidogrel (PLAVIX) 75 MG tablet Take 75 mg by mouth daily.    Yes [provider]  dexlansoprazole (DEXILANT) 60 MG capsule Take 1 tab by mouth every morning. Patient taking differently: Take 60 mg by mouth every evening.  10/18/16  Yes Esterwood, Amy S, PA-C    EPINEPHrine (EPIPEN 2-PAK) 0.3 mg/0.3 mL IJ SOAJ injection Inject into the muscle.   Yes [provider]  gabapentin (NEURONTIN) 600 MG tablet Take 600 mg by mouth 4 (four) times daily.   Yes [provider]  hydrALAZINE (APRESOLINE) 50 MG tablet Take 1 tablet (50 mg total) by mouth 3 (three) times daily. 07/04/17  Yes Herminio Commons, MD  losartan (COZAAR) 50 MG tablet Take 50 mg by mouth 2 (two) times daily.  06/09/16  Yes [provider]  rOPINIRole (REQUIP) 4 MG tablet Take 4 mg by mouth at bedtime.   Yes [provider]  spironolactone (ALDACTONE) 100 MG tablet Take 100 mg by mouth every evening.    Yes [provider]  zolpidem (AMBIEN CR) 12.5 MG CR tablet Take 12.5 mg by mouth at bedtime. 05/16/17  Yes [provider]  FLOVENT HFA 220 MCG/ACT inhaler Inhale 2 puffs into the lungs 2 (two) times daily. 08/17/17   [provider]  SPIRIVA HANDIHALER 18 MCG inhalation capsule Place 1 puff into inhaler and inhale daily. 08/15/17   [provider]    Family History Family History  Problem Relation Age of Onset  . Coronary artery disease Father   . Emphysema Father   . Heart attack Father   . Stroke Father   . Cancer Father        Unsure of type   . Allergic rhinitis Father   . Depression Mother   . Cancer Mother        skin cancer  . Bipolar disorder Brother   . Drug abuse Brother   . Cancer Brother        leukemia  . Bipolar disorder Daughter   . Allergic rhinitis Daughter   . Asthma Daughter   . Urticaria Daughter   . Diabetes Maternal Grandmother   . Stomach cancer Maternal Grandfather   . Cancer Maternal Grandfather        stomach   . Angioedema Neg Hx   . Atopy Neg Hx   . Eczema Neg Hx   . Immunodeficiency Neg Hx   . Breast cancer Neg Hx     Social History Social History   Tobacco Use  .  Smoking status: Current Every Day Smoker    Packs/day: 1.50    Years: 45.00    Pack years: 67.50     Types: Cigarettes    Start date: 03/04/1971  . Smokeless tobacco: Never Used  Substance Use Topics  . Alcohol use: No    Alcohol/week: 0.0 oz  . Drug use: No     Allergies   Other; Penicillins; Zithromax [azithromycin]; Aspirin; Pineapple; Strawberry extract; Wheat bran; Yeast-related products; Aspartame and phenylalanine; Mushroom extract complex; and Nicardipine   Review of Systems Review of Systems  Constitutional: Negative for chills and fever.  Eyes: Positive for photophobia and visual disturbance.  Respiratory: Positive for shortness of breath.   Cardiovascular: Positive for chest pain. Negative for palpitations and leg swelling.  Gastrointestinal: Positive for abdominal pain, diarrhea, nausea and vomiting. Negative for blood in stool.  Genitourinary: Negative for dysuria and frequency.  Neurological: Positive for weakness (chronic left UE and LE) and headaches. Negative for facial asymmetry and speech difficulty.  All other systems reviewed and are negative.    Physical Exam Updated Vital Signs BP (!) 207/96   Pulse 71   Temp 98.5 F (36.9 C) (Oral)   Resp (!) 24   Ht 5\' 6"  (1.676 m)   Wt 99.8 kg (220 lb)   SpO2 95%   BMI 35.51 kg/m   Physical Exam  Constitutional: She is oriented to person, place, and time. She appears well-developed and well-nourished. She does not appear ill. No distress.  HENT:  Head: Normocephalic and atraumatic.  Mouth/Throat: Oropharynx is clear and moist.  Eyes: Conjunctivae and EOM are normal. Pupils are equal, round, and reactive to light.  Cardiovascular: Normal rate, regular rhythm, normal heart sounds, intact distal pulses and normal pulses.  Pulmonary/Chest: Effort normal and breath sounds normal. No stridor. No respiratory distress. She has no wheezes. She has no rales.  Abdominal: Soft. Bowel sounds are normal. She exhibits no distension and no mass. There is tenderness in the epigastric area. There is no rebound and no guarding.    Musculoskeletal:  Trace LE pre-tibial edema, no tenderness or erythema.  Neurological: She is alert and oriented to person, place, and time.  Mental Status:  Alert, oriented, thought content appropriate, able to give a coherent history. Speech fluent without evidence of aphasia. Able to follow 2 step commands without difficulty.  Cranial Nerves:  II:  Peripheral visual fields abnormal (chronic Left lower visual field deficit)  pupils equal, round, reactive to light III,IV, VI: ptosis not present, extra-ocular motions with limited left lower gaze (pt states this is chronic as well) V,VII: smile symmetric, facial light touch sensation equal VIII: hearing grossly normal to voice  X: uvula elevates symmetrically  XI: bilateral shoulder shrug symmetric and strong XII: midline tongue extension without fassiculations Motor:  Normal tone. 5/5 in upper extremities bilaterally including strong and equal grip strength, 4/5 strength LLE and 5/5 RLE  Sensory: Pinprick and light touch normal in all extremities.  Deep Tendon Reflexes: 2+ and symmetric in the biceps and patella Cerebellar: normal finger-to-nose with bilateral upper extremities Gait: normal gait and balance CV: distal pulses palpable throughout    Skin: Skin is warm.  Psychiatric: She has a normal mood and affect. Her behavior is normal.  Nursing [name] and vitals reviewed.    ED Treatments / Results  Labs (all labs ordered are listed, but only abnormal results are displayed) Labs Reviewed  RAPID URINE DRUG SCREEN, HOSP PERFORMED - Abnormal; Notable for the following components:  Result Value   Opiates POSITIVE (*)    All other components within normal limits  I-STAT BETA HCG BLOOD, ED ([facility], [facility], [facility] ONLY) - Abnormal; Notable for the following components:   I-stat hCG, quantitative 13.7 (*)    All other components within normal limits  BASIC METABOLIC PANEL  CBC  HEPATIC FUNCTION PANEL  LIPASE, BLOOD  URINALYSIS, ROUTINE W  REFLEX MICROSCOPIC  I-STAT TROPONIN, ED  I-STAT TROPONIN, ED    EKG  EKG Interpretation  Date/Time:  Saturday August 20 2017 11:44:11 EDT Ventricular Rate:  112 PR Interval:    QRS Duration: 88 QT Interval:  350 QTC Calculation: 478 R Axis:   23 Text Interpretation:  Sinus tachycardia Confirmed by Nat Christen [phone]) on 08/20/2017 1:01:54 PM       Radiology Dg Chest 2 View  Result Date: 08/20/2017 CLINICAL DATA:  Left-sided chest pain. EXAM: CHEST - 2 VIEW COMPARISON:  Chest x-ray dated May 16, 2017. FINDINGS: The heart size and mediastinal contours are within normal limits. Both lungs are clear. The visualized skeletal structures are unremarkable. IMPRESSION: No active cardiopulmonary disease. Electronically Signed   By: Titus Dubin M.D.   On: 08/20/2017 12:24   Ct Head Wo Contrast  Result Date: 08/20/2017 CLINICAL DATA:  Hypertensive patient with headache. EXAM: CT HEAD WITHOUT CONTRAST TECHNIQUE: Contiguous axial images were obtained from the base of the skull through the vertex without intravenous contrast. COMPARISON:  MR head 05/18/2017.  CT head 05/19/2017. FINDINGS: Brain: No evidence for acute infarction, hemorrhage, mass lesion, hydrocephalus, or extra-axial fluid. Normal cerebral volume. Subcortical BILATERAL supratentorial white matter signal abnormality, appears similar to prior MR, consistent with chronic microvascular ischemic change. Vascular: No hyperdense vessel or unexpected calcification. Skull: Normal. Negative for fracture or focal lesion. Sinuses/Orbits: No acute finding. Other: None. IMPRESSION: Chronic changes as described. No definite acute intracranial findings. See discussion above. If strong concern for PRES/hypertensive encephalopathy, MR is more sensitive. Electronically Signed   By: Staci Righter M.D.   On: 08/20/2017 14:50    Procedures Procedures (including critical care time)  Medications Ordered in ED Medications  ondansetron (ZOFRAN)  injection 4 mg (4 mg Intravenous Given 08/20/17 1340)  sodium chloride 0.9 % bolus 500 mL (0 mLs Intravenous Stopped 08/20/17 1455)  morphine 4 MG/ML injection 4 mg (4 mg Intravenous Given 08/20/17 1340)  morphine 4 MG/ML injection 4 mg (4 mg Intravenous Given 08/20/17 1551)  LORazepam (ATIVAN) injection 2 mg (2 mg Intravenous Given 08/20/17 1817)     Initial Impression / Assessment and Plan / ED Course  I have reviewed the triage vital signs and the nursing notes.  Pertinent labs & imaging results that were available during my care of the patient were reviewed by me and considered in my medical decision making (see chart for details).  Clinical Course as of Aug 21 1834  Sat Aug 20, 2017  1232 Pt is s/p hysterectomy I-stat hCG, quantitative: (!) 13.7 [JR]  1727 Dr. Alfredia Ferguson with [facility] accepting admission.  [JR]    Clinical Course User Index [JR] Dary Dilauro, [place] N, PA-C    Patient with multiple comorbidities, and history of uncontrolled hypertension, presenting to the ED with chest pain, headache, nausea vomiting diarrhea since yesterday.  Patient has history of CVA with residual left upper and lower external weakness and left eye visual defects.  No new focal neuro deficits appreciated on exam today.  On initial evaluation, patient with active left-sided chest pain patient is hypertensive in the 200s/100s. Cardiac  workup was initiated in triage, with negative troponin, negative chest x-ray, and EKG without new changes.  CT head negative for acute pathology; no new neuro deficits on exam, do not think emergent MRI is indicated at this time.  CBC, BMP, liver function panel within normal limits.  Chest pain and headache improved with morphine, however returned in the ED.  Delta trop neg. Patient discussed with and evaluated with Dr. Lacinda Axon.  Discused controlling blood pressure ED, however patient reports her normal blood pressure ranges 595-638 systolic therefore decided not to medicate for blood pressure  at this time.  Given patient's risk factors and active chest pain in the ED, recommend admission for ACS rule out with hypertension.   Dr. Alfredia Ferguson with [facility] accepting admission. Spoke with Cardiology per hospitalist request; Dr. Emilio Aspen who will evaluate patient in the morning.  Patient discussed with and seen by Dr. Lacinda Axon.  The patient appears reasonably stabilized for admission considering the current resources, flow, and capabilities available in the ED at this time, and I doubt any other [facility] requiring further screening and/or treatment in the ED prior to admission.  Final Clinical Impressions(s) / ED Diagnoses   Final diagnoses:  Hypertensive urgency  Precordial chest pain  Bad headache  Nausea vomiting and diarrhea    ED Discharge Orders    None       Kaysha Parsell, [place] N, PA-C 08/20/17 Ardyth Harps, MD 08/21/17 1620

## 2017-08-20 NOTE — H&P (Signed)
History and Physical    Katrina [name] DOB: 04/17/1961 DOA: 08/20/2017  PCP: Berkley Harvey, NP   Patient coming from: Home  Chief Complaint: Chest Pain, SOB, Headache and Dizziness  HPI: Katrina [name] is a 57 y.o. female with medical history significant of Multiple comorbidities including Anxiety, Asthma, COPD, Headaches, HTN, HLD, GERD, RLS, PTSD, Depression, Hx of Diverticulitis, Insomnia, Hx of Bipolar, and other comorbidites who presented to the ED with CP, SOB, Back Pain and Headache. Patient states she has chronic back pain but started having Chest Pain that was intermittent lasting a minute, worse with exertion, that was substernal and left sided radiating into Neck and Arm. Subsequently she developed a headache and started having Nausea, Vomiting, Diarrhea. She stated when she developed the CP it was a 10/10 in intensity and now is a 5/10. States she was also SOB, lightheaded and diaphoretic. She denied any burning or discomfort in Urine. States BP has been extremely hard to control and states Renal artery has a "kink in it." Also admitted to having blurred vision. TRH was called to admit patient for Chest Pain and HTN Emergency.  ED Course: Had Basic Blood Work done and given Morphine for Chest Pain x2; She refused Nitroglycerin and was given a 500 mL bolus. EDP called Cardiology.   Review of Systems: As per HPI otherwise 10 point review of systems negative.  Past Medical History:  Diagnosis Date  . Allergy   . Anemia 1975-1976   . Anginal pain ([place])   . Anxiety    takes Valium daily as needed  . Arthritis    "spine" (12/03/2016)  . Asthma    has inhalers but doesn't use (12/03/2016)  . CAD (coronary artery disease)    Moderate nonobstructive 2012-2013, [place]; No angiographic evidence of CAD 01/31/15 [facility]  . Chronic bronchitis ([place])    "get it a couple times q yr" (12/03/2016)  . Chronic kidney disease   . Chronic lower back pain    budlging disc   .  Claustrophobia   . COPD (chronic obstructive pulmonary disease) ([place])   . Daily headache   . Depression   . Diverticulitis   . Family history of adverse reaction to anesthesia    2 daughters gets extremely sick   . GERD (gastroesophageal reflux disease)    takes Dexilant daily  . Heart murmur   . History of blood transfusion 1975-1976 "several"   no abnormal reaction noted  . History of colitis   . History of colon polyps    benign  . History of gastric ulcer   . History of hiatal hernia    "small one"  . History of MRSA infection 2017  . Hyperlipidemia    was on meds but has been off over a yr  . Hypertension    takes Amlodipine and Maxzide daily  . Insomnia    takes Ambien nightly  . Iron deficiency anemia    "when I was young"  . Lung nodules   . Microscopic hematuria   . Migraine    "2-3/wk" (12/03/2016)  . MS (multiple sclerosis) ([place])    questionable per pt  . Myocardial infarction ([place]) 2000   "mild one"  . Noncompliance   . Osteoporosis   . Peripheral neuropathy    weakness,numbness,and tingling. Takes Gabapentin daily  . Pneumonia 06/2015  . PTSD (post-traumatic stress disorder) dx'd 2016/2017   "was dx'd w/bipolar in 1991; replaced w/PTSD dx 2016/2017" (12/03/2016)  . Restless leg  syndrome    takes Requip daily  . 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 Type II diabetes mellitus ([place])    "went off insulin in 2012/2013" (12/03/2016)   Past Surgical History:  Procedure Laterality Date  . ABDOMINAL AORTOGRAM N/A 12/03/2016   Procedure: Abdominal Aortogram;  Surgeon: Angelia Mould, MD;  Location: [place] [facility];  Service: Cardiovascular;  Laterality: N/A;  . ABDOMINAL HYSTERECTOMY  12/1987  . ADENOIDECTOMY  1975  . ANKLE SURGERY Bilateral 1993; 1995 X2   "stabilzation done; 1 on the right; 2 on the left"  . APPENDECTOMY  1989  . BACK SURGERY    . BREAST  EXCISIONAL BIOPSY Left   . BREAST LUMPECTOMY Left    "benign tumor"  . CARDIAC CATHETERIZATION N/A 01/31/2015   Procedure: Left Heart Cath and Coronary Angiography;  Surgeon: Burnell Blanks, MD;  Location: [place] [facility];  Service: Cardiovascular;  Laterality: N/A;  . CARDIAC CATHETERIZATION  2009; 2011  . COLONOSCOPY    . DILATION AND CURETTAGE OF UTERUS    . ESOPHAGOGASTRODUODENOSCOPY    . LAPAROSCOPIC CHOLECYSTECTOMY  1996  . NASAL RECONSTRUCTION  1976  . NASAL SINUS SURGERY  1975  . POSTERIOR LUMBAR FUSION  2005  . RADIOLOGY WITH ANESTHESIA N/A 01/15/2016   Procedure: MRI LUMBAR SPINE WITHOUT;  Surgeon: Medication Radiologist, MD;  Location: [facility];  Service: Radiology;  Laterality: N/A;  . RADIOLOGY WITH ANESTHESIA N/A 06/29/2016   Procedure: MRI OF THE BRAIN WITH AND WITHOUT;  Surgeon: Medication Radiologist, MD;  Location: [place] [place];  Service: Radiology;  Laterality: N/A;  . RENAL ANGIOGRAPHY N/A 12/03/2016   Procedure: Renal Angiography;  Surgeon: Angelia Mould, MD;  Location: [facility];  Service: Cardiovascular;  Laterality: N/A;  . TONSILLECTOMY  1992  . TUBAL LIGATION    . TUMOR EXCISION  1998   from back of skull; developed;  MRSA from the area that had to be packed   SOCIAL HISTORY  reports that she has been smoking cigarettes.  She started smoking about 46 years ago. She has a 67.50 pack-year smoking history. she has never used smokeless tobacco. She reports that she does not drink alcohol or use drugs.  Allergies  Allergen Reactions  . Other Anaphylaxis and Swelling    [place]  . Penicillins Anaphylaxis    Has patient had a PCN reaction causing immediate rash, facial/tongue/throat swelling, SOB or lightheadedness with hypotension: Yes Has patient had a PCN reaction causing severe rash involving mucus membranes or skin necrosis: Yes Has patient had a PCN reaction that required hospitalization Yes Has patient had a PCN reaction occurring within the  last 10 years: No If all of the above answers are "NO", then may proceed with Cephalosporin use.   . Zithromax [Azithromycin] Anaphylaxis  . Aspirin Other (See Comments)    Due to stomach ulcers.   . Pineapple Rash  . Strawberry Extract Rash and Hives  . Wheat Bran Other (See Comments)    Sneezing, rhinorrhea  . Yeast-Related Products Other (See Comments)    Sneezing, rhinorrhea  . Aspartame And Phenylalanine Palpitations  . Mushroom Extract Complex Rash  . Nicardipine Nausea And Vomiting and Other (See Comments)    shaking   Family History  Problem Relation Age of Onset  . Coronary artery disease Father   . Emphysema Father   . Heart attack Father   .  Stroke Father   . Cancer Father        Unsure of type   . Allergic rhinitis Father   . Depression Mother   . Cancer Mother        skin cancer  . Bipolar disorder Brother   . Drug abuse Brother   . Cancer Brother        leukemia  . Bipolar disorder Daughter   . Allergic rhinitis Daughter   . Asthma Daughter   . Urticaria Daughter   . Diabetes Maternal Grandmother   . Stomach cancer Maternal Grandfather   . Cancer Maternal Grandfather        stomach   . Angioedema Neg Hx   . Atopy Neg Hx   . Eczema Neg Hx   . Immunodeficiency Neg Hx   . Breast cancer Neg Hx    Prior to Admission medications   Medication Sig Start Date End Date Taking? Authorizing Provider  amLODipine (NORVASC) 10 MG tablet Take 1 tablet (10 mg total) by mouth daily. 06/05/14  Yes Herminio Commons, MD  atenolol (TENORMIN) 50 MG tablet Take 1 tablet (50 mg total) by mouth daily. 05/02/17  Yes Herminio Commons, MD  chlorthalidone (HYGROTON) 25 MG tablet Take 25 mg by mouth 2 (two) times daily. 06/25/16 06/24/18 Yes [provider]  clopidogrel (PLAVIX) 75 MG tablet Take 75 mg by mouth daily.    Yes [provider]  dexlansoprazole (DEXILANT) 60 MG capsule Take 1 tab by mouth every morning. Patient taking differently: Take 60 mg  by mouth every evening.  10/18/16  Yes Esterwood, Amy S, PA-C  EPINEPHrine (EPIPEN 2-PAK) 0.3 mg/0.3 mL IJ SOAJ injection Inject into the muscle.   Yes [provider]  gabapentin (NEURONTIN) 600 MG tablet Take 600 mg by mouth 4 (four) times daily.   Yes [provider]  hydrALAZINE (APRESOLINE) 50 MG tablet Take 1 tablet (50 mg total) by mouth 3 (three) times daily. 07/04/17  Yes Herminio Commons, MD  losartan (COZAAR) 50 MG tablet Take 50 mg by mouth 2 (two) times daily.  06/09/16  Yes [provider]  rOPINIRole (REQUIP) 4 MG tablet Take 4 mg by mouth at bedtime.   Yes [provider]  spironolactone (ALDACTONE) 100 MG tablet Take 100 mg by mouth every evening.    Yes [provider]  zolpidem (AMBIEN CR) 12.5 MG CR tablet Take 12.5 mg by mouth at bedtime. 05/16/17  Yes [provider]  FLOVENT HFA 220 MCG/ACT inhaler Inhale 2 puffs into the lungs 2 (two) times daily. 08/17/17   [provider]  SPIRIVA HANDIHALER 18 MCG inhalation capsule Place 1 puff into inhaler and inhale daily. 08/15/17   [provider]   Physical Exam: Vitals:   08/20/17 1923 08/20/17 1941 08/20/17 1956 08/20/17 2007  BP: (!) 206/124 (!) 190/96 (!) 226/106 (!) 209/102  Pulse: (!) 106  74 91  Resp: (!) 31 (!) 24 19   Temp:      TempSrc:      SpO2: (!) 86%  99% 92%  Weight:      Height:       Constitutional: WN/WD obese Caucasian female who is anxious Eyes: Lids and conjunctivae normal, sclerae anicteric  ENMT: External Ears, Nose appear normal. Grossly normal hearing. Mucous membranes are moist.  Neck: Appears normal, supple, no cervical masses, normal ROM, no appreciable thyromegaly, no JVD Respiratory: Clear to auscultation bilaterally, no wheezing, rales, rhonchi or crackles. Normal respiratory  effort and patient is not tachypenic. No accessory muscle use.  Cardiovascular: Tachycardic Rate but regular rhythm, Slight Murmur. S1 and S2  auscultated. 1+ LE Edema Abdomen: Soft, non-tender, Distended due to body habitus. No masses palpated. No appreciable hepatosplenomegaly. Bowel sounds positive x3.  GU: Deferred. Musculoskeletal: No clubbing / cyanosis of digits/nails. No joint deformity upper and lower extremities.  Skin: No rashes, lesions, ulcers. Has tattoos scattered throughout the body. No induration; Warm and dry.  Neurologic: CN 2-12 grossly intact with no focal deficits. Has Residual Left sided weakness. Romberg sign and cerebellar reflexes not assessed.  Psychiatric: Normal judgment and insight. Alert and oriented x 3. Normal mood and appropriate affect.   Labs on Admission: I have personally reviewed following labs and imaging studies  CBC: Recent Labs  Lab 08/20/17 1204  WBC 10.5  HGB 14.3  HCT 44.3  MCV 90.4  PLT 350   Basic Metabolic Panel: Recent Labs  Lab 08/20/17 1204  NA 141  K 3.8  CL 106  CO2 23  GLUCOSE 89  BUN 14  CREATININE 0.80  CALCIUM 9.1   GFR: Estimated Creatinine Clearance: 93.6 mL/min (by C-G formula based on SCr of 0.8 mg/dL). Liver Function Tests: Recent Labs  Lab 08/20/17 1248  AST 23  ALT 17  ALKPHOS 68  BILITOT 0.4  PROT 6.7  ALBUMIN 3.6   Recent Labs  Lab 08/20/17 1300  LIPASE 22   No results for input(s): AMMONIA in the last 168 hours. Coagulation Profile: No results for input(s): INR, PROTIME in the last 168 hours. Cardiac Enzymes: No results for input(s): CKTOTAL, CKMB, CKMBINDEX, TROPONINI in the last 168 hours. BNP (last 3 results) No results for input(s): PROBNP in the last 8760 hours. HbA1C: No results for input(s): HGBA1C in the last 72 hours. CBG: No results for input(s): GLUCAP in the last 168 hours. Lipid Profile: No results for input(s): CHOL, HDL, LDLCALC, TRIG, CHOLHDL, LDLDIRECT in the last 72 hours. Thyroid Function Tests: No results for input(s): TSH, T4TOTAL, FREET4, T3FREE, THYROIDAB in the last 72 hours. Anemia Panel: No results  for input(s): VITAMINB12, FOLATE, FERRITIN, TIBC, IRON, RETICCTPCT in the last 72 hours. Urine analysis:    Component Value Date/Time   COLORURINE YELLOW 08/20/2017 1739   APPEARANCEUR CLEAR 08/20/2017 1739   LABSPEC <1.005 (L) 08/20/2017 1739   PHURINE 7.0 08/20/2017 1739   GLUCOSEU NEGATIVE 08/20/2017 1739   HGBUR SMALL (A) 08/20/2017 1739   BILIRUBINUR NEGATIVE 08/20/2017 1739   KETONESUR NEGATIVE 08/20/2017 1739   PROTEINUR NEGATIVE 08/20/2017 1739   UROBILINOGEN 0.2 01/23/2015 2255   NITRITE NEGATIVE 08/20/2017 1739   LEUKOCYTESUR NEGATIVE 08/20/2017 1739   Sepsis Labs: !!!!!!!!!!!!!!!!!!!!!!!!!!!!!!!!!!!!!!!!!!!! @LABRCNTIP (procalcitonin:4,lacticidven:4) )No results found for this or any previous visit (from the past 240 hour(s)).   Radiological Exams on Admission: Ct Abdomen Pelvis Wo Contrast  Result Date: 08/20/2017 CLINICAL DATA:  57 y/o F; nausea, vomiting, and diarrhea since yesterday. Epigastric abdominal pain. EXAM: CT ABDOMEN AND PELVIS WITHOUT CONTRAST TECHNIQUE: Multidetector CT imaging of the abdomen and pelvis was performed following the standard protocol without IV contrast. COMPARISON:  10/14/2016 CT abdomen and pelvis FINDINGS: Lower chest: 3 mm left lung base stable pulmonary nodule compatible benign etiology. Hepatobiliary: No focal liver abnormality is seen. Status post cholecystectomy. Stable enlargement of the common bile duct to 15 mm, probably compensatory post cholecystectomy, no intrahepatic biliary ductal dilatation. Pancreas: Unremarkable. No pancreatic ductal dilatation or surrounding inflammatory changes. Spleen: Normal in size without focal abnormality. Adrenals/Urinary Tract: Adrenal glands  are unremarkable. Kidneys are normal, without renal calculi, focal lesion, or hydronephrosis. Bladder is unremarkable. Stomach/Bowel: Stomach is within normal limits. Appendectomy. No evidence of bowel wall thickening, distention, or inflammatory changes.  Vascular/Lymphatic: Aortic atherosclerosis. No enlarged abdominal or pelvic lymph nodes. Reproductive: Status post hysterectomy. No adnexal masses. Other: No abdominal wall hernia or abnormality. No abdominopelvic ascites. Musculoskeletal: L5-S1 posterior instrumentation fusion hardware without apparent hardware related complication. No acute fracture. IMPRESSION: 1. No acute process identified. 2. Aortic atherosclerosis. 3. Cholecystectomy. Stable prominent common bile duct, probably compensatory post cholecystectomy. No intrahepatic biliary ductal dilatation. Electronically Signed   By: Kristine Garbe M.D.   On: 08/20/2017 20:07   Dg Chest 2 View  Result Date: 08/20/2017 CLINICAL DATA:  Left-sided chest pain. EXAM: CHEST - 2 VIEW COMPARISON:  Chest x-ray dated May 16, 2017. FINDINGS: The heart size and mediastinal contours are within normal limits. Both lungs are clear. The visualized skeletal structures are unremarkable. IMPRESSION: No active cardiopulmonary disease. Electronically Signed   By: Titus Dubin M.D.   On: 08/20/2017 12:24   Ct Head Wo Contrast  Result Date: 08/20/2017 CLINICAL DATA:  Hypertensive patient with headache. EXAM: CT HEAD WITHOUT CONTRAST TECHNIQUE: Contiguous axial images were obtained from the base of the skull through the vertex without intravenous contrast. COMPARISON:  MR head 05/18/2017.  CT head 05/19/2017. FINDINGS: Brain: No evidence for acute infarction, hemorrhage, mass lesion, hydrocephalus, or extra-axial fluid. Normal cerebral volume. Subcortical BILATERAL supratentorial white matter signal abnormality, appears similar to prior MR, consistent with chronic microvascular ischemic change. Vascular: No hyperdense vessel or unexpected calcification. Skull: Normal. Negative for fracture or focal lesion. Sinuses/Orbits: No acute finding. Other: None. IMPRESSION: Chronic changes as described. No definite acute intracranial findings. See discussion above. If  strong concern for PRES/hypertensive encephalopathy, MR is more sensitive. Electronically Signed   By: Staci Righter M.D.   On: 08/20/2017 14:50   Mr Brain Wo Contrast  Result Date: 08/20/2017 CLINICAL DATA:  57 year old female with headache. Chest pain and hypertension. EXAM: MRI HEAD WITHOUT CONTRAST TECHNIQUE: Multiplanar, multiecho pulse sequences of the brain and surrounding structures were obtained without intravenous contrast. COMPARISON:  Head CT without contrast 1418 hours today and earlier. [facility] Brain MRI 05/18/2017. FINDINGS: The study is intermittently degraded by motion artifact despite repeated imaging attempts. Brain: Cerebral volume remains within normal limits. No restricted diffusion to suggest acute infarction. No midline shift, mass effect, evidence of mass lesion, ventriculomegaly, extra-axial collection or acute intracranial hemorrhage. Cervicomedullary junction and pituitary are within normal limits. Scattered chronic nonspecific cerebral white matter T2 and FLAIR hyperintensity appears stable. The extent is moderate for age. No cortical encephalomalacia or chronic cerebral blood products identified. The bilateral deep gray matter nuclei, the brainstem, and cerebellum remain normal. Vascular: Major intracranial vascular flow voids are stable. Skull and upper cervical spine: Stable bone marrow signal. Sinuses/Orbits: Stable and negative. Other: Trace left mastoid fluid is stable since 2018. A mild right mastoid effusion has developed. The nasopharynx appears stable and negative. Scalp and face soft tissues appear stable and negative. IMPRESSION: 1.  No acute intracranial abnormality. 2. Continued stable noncontrast MRI appearance of the brain since 2018, with chronic moderately advanced but nonspecific cerebral white matter signal changes. 3. New mild right mastoid effusion since 2018, probably postinflammatory and significance doubtful. Electronically Signed   By: Genevie Ann  M.D.   On: 08/20/2017 19:22    EKG: Independently reviewed. Showed Sinus Tachycardia at a rate of 112. No evidence of  ST Elevation or Depression on my interpretation.   Assessment/Plan Active Problems:   GERD (gastroesophageal reflux disease)   Uncontrolled hypertension   Atypical chest pain   Precordial pain   Generalized anxiety disorder   Chronic bilateral low back pain without sciatica   History of cerebrovascular accident (CVA) with residual deficit   Cerebral microvascular disease   Hypertension   Chest pain in adult  Chest Pain r/o ACS in the setting of Hypertensive Emergency -Stated CP is intermittent and lasts 1 minute. Never this severe -CP Order Set -Cycle Cardiac Troponins -Check Mag, Phos, TSH, Lipid Panel -Refused NTG given worsening Headaches -Check UDS  -Start ASA 325 mg po Daily  -Cardiology Consulted for further evaluation and recommendations  Hypertensive Emergency with Headache -Restart Home BP Meds including Amlodipine 10 mg po Daily, Chlorthalidone 25 mg po BID, Atenolol 50 mg po Daily, Hydralazine 50 mg po TID, Losartan 50 mg po BID, and Spironolactone 100 mg po qHS; States her baseline BP remains in the 180's-190's -Start IV Hydralazine 10 mg IV q6hprn for SBP>180 or DBP>100 -Checked Head CT and showed and showed Chronic changes as described. No definite acute intracranial findings. -Still having a Headache so will get MRI  Nausea/Vomiting/Diarrhea -States she has been having significant amount -Check CT Abd/Pelvis -Lipase Level was 22 -Possible Viral in Etiology given patient is Afebrile and No WBC -Continue to Monitor Closely   Hx of CVA with Residual Deficits -C/w Plavix 75 mg po Daily   Generalized Anxiety Disorder -Start Alprazolam 0.25 mg po BIDprn  COPD  -C/w Home Nebulizer  GERD -C/w PPI  RLS -C/w Requip  DVT prophylaxis: Lovenox  Code Status: DO NOT RESUSCITATE  Family Communication: No family present at bedside  Disposition  Plan: Home Consults called: Cardiology via EDP Admission status: Obs Telemetry   Severity of Illness: The appropriate patient status for this patient is OBSERVATION. Observation status is judged to be reasonable and necessary in order to provide the required intensity of service to ensure the patient's safety. The patient's presenting symptoms, physical exam findings, and initial radiographic and laboratory data in the context of their medical condition is felt to place them at decreased risk for further clinical deterioration. Furthermore, it is anticipated that the patient will be medically stable for discharge from the hospital within 2 midnights of admission. The following factors support the patient status of observation.   " The patient's presenting symptoms include Chest Pain and Headache. " The physical exam findings include Residual Weakness. " The initial radiographic and laboratory data are reassuring.  Kerney Elbe, D.O. [facility] Pager [phone]  If 7PM-7AM, please contact night-coverage [email or web address] Password [facility]  08/20/2017, 8:13 PM

## 2017-08-20 NOTE — ED Triage Notes (Signed)
Patient here from home with complaints of chest pain and hypertension. States that she is on multiple medications with no relief. Hx of same.

## 2017-08-21 ENCOUNTER — Other Ambulatory Visit: Payer: Self-pay

## 2017-08-21 ENCOUNTER — Encounter (HOSPITAL_COMMUNITY): Payer: Self-pay | Admitting: Cardiology

## 2017-08-21 DIAGNOSIS — R079 Chest pain, unspecified: Secondary | ICD-10-CM | POA: Diagnosis not present

## 2017-08-21 DIAGNOSIS — F411 Generalized anxiety disorder: Secondary | ICD-10-CM | POA: Diagnosis not present

## 2017-08-21 DIAGNOSIS — I2510 Atherosclerotic heart disease of native coronary artery without angina pectoris: Secondary | ICD-10-CM | POA: Diagnosis not present

## 2017-08-21 DIAGNOSIS — R0789 Other chest pain: Secondary | ICD-10-CM | POA: Diagnosis not present

## 2017-08-21 DIAGNOSIS — K219 Gastro-esophageal reflux disease without esophagitis: Secondary | ICD-10-CM | POA: Diagnosis not present

## 2017-08-21 DIAGNOSIS — R1319 Other dysphagia: Secondary | ICD-10-CM | POA: Diagnosis not present

## 2017-08-21 DIAGNOSIS — I6930 Unspecified sequelae of cerebral infarction: Secondary | ICD-10-CM | POA: Diagnosis not present

## 2017-08-21 DIAGNOSIS — R51 Headache: Secondary | ICD-10-CM | POA: Diagnosis not present

## 2017-08-21 DIAGNOSIS — Z720 Tobacco use: Secondary | ICD-10-CM | POA: Diagnosis not present

## 2017-08-21 DIAGNOSIS — I10 Essential (primary) hypertension: Secondary | ICD-10-CM

## 2017-08-21 DIAGNOSIS — I151 Hypertension secondary to other renal disorders: Secondary | ICD-10-CM | POA: Diagnosis not present

## 2017-08-21 DIAGNOSIS — M545 Low back pain: Secondary | ICD-10-CM | POA: Diagnosis not present

## 2017-08-21 DIAGNOSIS — R072 Precordial pain: Secondary | ICD-10-CM | POA: Diagnosis not present

## 2017-08-21 DIAGNOSIS — I679 Cerebrovascular disease, unspecified: Secondary | ICD-10-CM | POA: Diagnosis not present

## 2017-08-21 DIAGNOSIS — I161 Hypertensive emergency: Secondary | ICD-10-CM | POA: Diagnosis not present

## 2017-08-21 LAB — CBC WITH DIFFERENTIAL/PLATELET
Basophils Absolute: 0 10*3/uL (ref 0.0–0.1)
Basophils Relative: 0 %
Eosinophils Absolute: 0 10*3/uL (ref 0.0–0.7)
Eosinophils Relative: 0 %
HCT: 43.4 % (ref 36.0–46.0)
HEMOGLOBIN: 13.9 g/dL (ref 12.0–15.0)
LYMPHS ABS: 2.7 10*3/uL (ref 0.7–4.0)
LYMPHS PCT: 25 %
MCH: 29.3 pg (ref 26.0–34.0)
MCHC: 32 g/dL (ref 30.0–36.0)
MCV: 91.6 fL (ref 78.0–100.0)
Monocytes Absolute: 0.5 10*3/uL (ref 0.1–1.0)
Monocytes Relative: 5 %
Neutro Abs: 7.6 10*3/uL (ref 1.7–7.7)
Neutrophils Relative %: 70 %
Platelets: 290 10*3/uL (ref 150–400)
RBC: 4.74 MIL/uL (ref 3.87–5.11)
RDW: 14.8 % (ref 11.5–15.5)
WBC: 10.8 10*3/uL — AB (ref 4.0–10.5)

## 2017-08-21 LAB — LIPID PANEL
CHOL/HDL RATIO: 4.7 ratio
Cholesterol: 140 mg/dL (ref 0–200)
HDL: 30 mg/dL — ABNORMAL LOW (ref >40)
LDL Cholesterol: 82 mg/dL (ref 0–99)
Triglycerides: 142 mg/dL (ref <150)
VLDL: 28 mg/dL (ref 0–40)

## 2017-08-21 LAB — COMPREHENSIVE METABOLIC PANEL
ALK PHOS: 72 U/L (ref 38–126)
ALT: 63 U/L — AB (ref 14–54)
AST: 79 U/L — ABNORMAL HIGH (ref 15–41)
Albumin: 3.7 g/dL (ref 3.5–5.0)
Anion gap: 10 (ref 5–15)
BUN: 13 mg/dL (ref 6–20)
CALCIUM: 9.1 mg/dL (ref 8.9–10.3)
CO2: 25 mmol/L (ref 22–32)
CREATININE: 0.94 mg/dL (ref 0.44–1.00)
Chloride: 106 mmol/L (ref 101–111)
Glucose, Bld: 107 mg/dL — ABNORMAL HIGH (ref 65–99)
Potassium: 4.2 mmol/L (ref 3.5–5.1)
Sodium: 141 mmol/L (ref 135–145)
Total Bilirubin: 0.6 mg/dL (ref 0.3–1.2)
Total Protein: 6.7 g/dL (ref 6.5–8.1)

## 2017-08-21 LAB — TROPONIN I: Troponin I: <0.03 ng/mL (ref <0.03)

## 2017-08-21 LAB — MAGNESIUM: MAGNESIUM: 2.2 mg/dL (ref 1.7–2.4)

## 2017-08-21 LAB — MRSA PCR SCREENING: MRSA by PCR: NEGATIVE

## 2017-08-21 LAB — TSH: TSH: 2.522 u[IU]/mL (ref 0.350–4.500)

## 2017-08-21 LAB — PHOSPHORUS: PHOSPHORUS: 4.8 mg/dL — AB (ref 2.5–4.6)

## 2017-08-21 NOTE — Progress Notes (Signed)
Patient insists she will not sleep in the bed. Refuses all BP med. Said she took some this morning, others she have not taking before and will not take.  Refuses blood thinner and SCD's. BP  remain elevated which she said is normal for her.

## 2017-08-21 NOTE — Progress Notes (Signed)
Patient has stopped shaking and feels somewhat better from that standpoint.  BP continues to be high.  MD has ordered esophagram.

## 2017-08-21 NOTE — Plan of Care (Signed)
Patients pain has largely moved from chest to more epigastric, little relief from GI cocktail.  Awaiting esophogram.

## 2017-08-21 NOTE — Progress Notes (Signed)
SLP Cancellation Note  Patient Details Name: Katrina [name] MRN: [id number] DOB: 1960/10/16   Cancelled treatment:       Reason Eval/Treat Not Completed: Other (comment). Pt reports symptoms of possible esophageal dysphagia and MD has ordered esophagram. Will f/u after esophagram for potential SLP needs if present. Discussed with RN.    Yehuda Printup, Katherene Ponto 08/21/2017, 3:04 PM

## 2017-08-21 NOTE — Progress Notes (Signed)
Patient with history of MRSA, last swabbed in December which was negative.  Patient reluctantly agreed to allow this RN to swab her for MRSA as per our policy, MRSA came back negative, contact precautions discontinued as per policy.

## 2017-08-21 NOTE — Progress Notes (Signed)
Pt has refuse all breathing medication for this am. Rt sent medication back to pharmacy. No distress noted at this time.

## 2017-08-21 NOTE — Consult Note (Signed)
Cardiology Consultation:   Patient ID: Katrina [name]; [id number]; December 09, 1960   Admit date: 08/20/2017 Date of Consult: 08/21/2017  Primary Care Provider: Berkley Harvey, NP Primary Cardiologist: Kate Sable, MD  Primary Electrophysiologist:  None   Patient Profile:   Katrina [name] is a 57 y.o. female with a hx of multiple medical problems and normal coronaries on cath in 2016 who is being seen today for the evaluation of chest pain at the request of Dr. Chana Bode.  History of Present Illness:   Katrina [name] has no past cardiac history.  She presented with chest pain.  She was very hypertensive in the ED.  She has a history of difficult to control HTN related at leas in part to medication non adherence.  She reports that her pain has been constant since Friday.  It is in the left upper chest with radiation to the left mid shoulder.  It is sharp.  She does think that it is like her previous pain but more severe.  She has not been pain free since admission thought at another time she says it comes and goes.  She had associated lower back pain and diarrhea.  She has had some nausea.  She was driving down to [place] to do some shopping when her pain became so severe that she presented to the ED.  She says at home the most she does is walk to the car.  She does not think that there is any activity or position that makes it worse or better.   So far her enzymes have been negative.    She had a cardiac cath in 2016 with normal coronary arteries.  She had NL LV function.  Echo demonstrated a well preserved EF.  She has had multiple ED visits with various complaints including chest pain.  She was last seen in the office in Nov following an ED visit for chest pain.  EKG shows no acute ST T wave changes.   She is very hypertensive.  However, she refused meds last night.  She refused a heart healthy diet and ordered a pizza.    Past Medical History:  Diagnosis Date  . Allergy   . Anemia 1975-1976    . Anxiety    takes Valium daily as needed  . Arthritis    "spine" (12/03/2016)  . Asthma    has inhalers but doesn't use (12/03/2016)  . Chronic bronchitis ([place])    "get it a couple times q yr" (12/03/2016)  . Chronic kidney disease   . Chronic lower back pain    budlging disc   . Claustrophobia   . COPD (chronic obstructive pulmonary disease) ([place])   . Daily headache   . Depression   . Diverticulitis   . Family history of adverse reaction to anesthesia    2 daughters gets extremely sick   . GERD (gastroesophageal reflux disease)    takes Dexilant daily  . Heart murmur   . History of blood transfusion 1975-1976 "several"   no abnormal reaction noted  . History of colitis   . History of colon polyps    benign  . History of gastric ulcer   . History of hiatal hernia    "small one"  . History of MRSA infection 2017  . Hyperlipidemia    was on meds but has been off over a yr  . Hypertension    takes Amlodipine and Maxzide daily  . Insomnia    takes Ambien nightly  .  Iron deficiency anemia    "when I was young"  . Lung nodules   . Migraine    "2-3/wk" (12/03/2016)  . MS (multiple sclerosis) ([place])    questionable per pt  . Noncompliance   . Osteoporosis   . Peripheral neuropathy    weakness,numbness,and tingling. Takes Gabapentin daily  . PTSD (post-traumatic stress disorder) dx'd 2016/2017   "was dx'd w/bipolar in 1991; replaced w/PTSD dx 2016/2017" (12/03/2016)  . Restless leg syndrome    takes Requip daily  . 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 Type II diabetes mellitus ([place])    "went off insulin in 2012/2013" (12/03/2016)    Past Surgical History:  Procedure Laterality Date  . ABDOMINAL AORTOGRAM N/A 12/03/2016   Procedure: Abdominal Aortogram;  Surgeon: Angelia Mould, MD;  Location: [facility];  Service: Cardiovascular;  Laterality: N/A;  . ABDOMINAL  HYSTERECTOMY  12/1987  . ADENOIDECTOMY  1975  . ANKLE SURGERY Bilateral 1993; 1995 X2   "stabilzation done; 1 on the right; 2 on the left"  . APPENDECTOMY  1989  . BACK SURGERY    . BREAST EXCISIONAL BIOPSY Left   . BREAST LUMPECTOMY Left    "benign tumor"  . CARDIAC CATHETERIZATION N/A 01/31/2015   Procedure: Left Heart Cath and Coronary Angiography;  Surgeon: Burnell Blanks, MD;  Location: [place] [facility];  Service: Cardiovascular;  Laterality: N/A;  . COLONOSCOPY    . DILATION AND CURETTAGE OF UTERUS    . ESOPHAGOGASTRODUODENOSCOPY    . LAPAROSCOPIC CHOLECYSTECTOMY  1996  . NASAL RECONSTRUCTION  1976  . NASAL SINUS SURGERY  1975  . POSTERIOR LUMBAR FUSION  2005  . RADIOLOGY WITH ANESTHESIA N/A 01/15/2016   Procedure: MRI LUMBAR SPINE WITHOUT;  Surgeon: Medication Radiologist, MD;  Location: [facility];  Service: Radiology;  Laterality: N/A;  . RADIOLOGY WITH ANESTHESIA N/A 06/29/2016   Procedure: MRI OF THE BRAIN WITH AND WITHOUT;  Surgeon: Medication Radiologist, MD;  Location: [facility];  Service: Radiology;  Laterality: N/A;  . RENAL ANGIOGRAPHY N/A 12/03/2016   Procedure: Renal Angiography;  Surgeon: Angelia Mould, MD;  Location: [facility];  Service: Cardiovascular;  Laterality: N/A;  . TONSILLECTOMY  1992  . TUBAL LIGATION    . TUMOR EXCISION  1998   from back of skull; developed;  MRSA from the area that had to be packed    Prior to Admission medications   Medication Sig Start Date End Date Taking? Authorizing Provider  amLODipine (NORVASC) 10 MG tablet Take 1 tablet (10 mg total) by mouth daily. 06/05/14  Yes Herminio Commons, MD  atenolol (TENORMIN) 50 MG tablet Take 1 tablet (50 mg total) by mouth daily. 05/02/17  Yes Herminio Commons, MD  chlorthalidone (HYGROTON) 25 MG tablet Take 25 mg by mouth 2 (two) times daily. 06/25/16 06/24/18 Yes [provider]  clopidogrel (PLAVIX) 75 MG tablet Take 75 mg by mouth daily.    Yes [provider]  dexlansoprazole (DEXILANT) 60 MG capsule Take 1 tab by mouth every morning. Patient taking differently: Take 60 mg by mouth every evening.  10/18/16  Yes Esterwood, Amy S, PA-C  EPINEPHrine (EPIPEN 2-PAK) 0.3 mg/0.3 mL IJ SOAJ injection Inject into the muscle.   Yes [provider]  gabapentin (NEURONTIN) 600 MG tablet Take 600 mg by mouth 4 (four) times daily.   Yes [provider]  hydrALAZINE (APRESOLINE) 50 MG tablet Take 1 tablet (50 mg total) by mouth 3 (three) times daily. 07/04/17  Yes Herminio Commons, MD  losartan (COZAAR) 50 MG tablet Take 50 mg by mouth 2 (two) times daily.  06/09/16  Yes [provider]  rOPINIRole (REQUIP) 4 MG tablet Take 4 mg by mouth at bedtime.   Yes [provider]  spironolactone (ALDACTONE) 100 MG tablet Take 100 mg by mouth every evening.    Yes [provider]  zolpidem (AMBIEN CR) 12.5 MG CR tablet Take 12.5 mg by mouth at bedtime. 05/16/17  Yes [provider]  FLOVENT HFA 220 MCG/ACT inhaler Inhale 2 puffs into the lungs 2 (two) times daily. 08/17/17   [provider]  SPIRIVA HANDIHALER 18 MCG inhalation capsule Place 1 puff into inhaler and inhale daily. 08/15/17   [provider]     Inpatient Medications: Scheduled Meds: . amLODipine  10 mg Oral Daily  . atenolol  50 mg Oral Daily  . budesonide (PULMICORT) nebulizer solution  1 mg Nebulization BID  . chlorthalidone  25 mg Oral BID  . clopidogrel  75 mg Oral Daily  . enoxaparin (LOVENOX) injection  40 mg Subcutaneous Q24H  . gabapentin  600 mg Oral QID  . hydrALAZINE  50 mg Oral TID  . losartan  50 mg Oral BID  . pantoprazole  40 mg Oral Daily  . rOPINIRole  4 mg Oral QHS  . spironolactone  100 mg Oral QPM  . tiotropium  18 mcg Inhalation Daily   Continuous Infusions:  PRN Meds: acetaminophen, ALPRAZolam, gi cocktail, hydrALAZINE, morphine injection, ondansetron (ZOFRAN) IV, zolpidem  Allergies:      Allergies  Allergen Reactions  . Other Anaphylaxis and Swelling    [place]  . Penicillins Anaphylaxis    Has patient had a PCN reaction causing immediate rash, facial/tongue/throat swelling, SOB or lightheadedness with hypotension: Yes Has patient had a PCN reaction causing severe rash involving mucus membranes or skin necrosis: Yes Has patient had a PCN reaction that required hospitalization Yes Has patient had a PCN reaction occurring within the last 10 years: No If all of the above answers are "NO", then may proceed with Cephalosporin use.   . Zithromax [Azithromycin] Anaphylaxis  . Aspirin Other (See Comments)    Due to stomach ulcers.   . Pineapple Rash  . Strawberry Extract Rash and Hives  . Wheat Bran Other (See Comments)    Sneezing, rhinorrhea  . Yeast-Related Products Other (See Comments)    Sneezing, rhinorrhea  . Aspartame And Phenylalanine Palpitations  . Mushroom Extract Complex Rash  . Nicardipine Nausea And Vomiting and Other (See Comments)    shaking    Social History:   Social History   Socioeconomic History  . Marital status: Divorced    Spouse name: Not on file  . Number of children: 3  . Years of education: Some college  . Highest education level: Not on file  Social Needs  . Financial resource strain: Not on file  . Food insecurity - worry: Not on file  . Food insecurity - inability: Not on file  . Transportation needs - medical: Not on file  . Transportation needs - non-medical: Not on file  Occupational History    Comment: Disability for back pain  Tobacco Use  . Smoking status: Current Every Day Smoker    Packs/day: 1.50    Years: 45.00    Pack years: 67.50    Types:  Cigarettes    Start date: 03/04/1971  . Smokeless tobacco: Never Used  Substance and Sexual Activity  . Alcohol use: No    Alcohol/week: 0.0 oz  . Drug use: No  . Sexual activity: No    Birth control/protection: Surgical  Other Topics Concern  . Not on file  Social  History Narrative   Lives at home with her daughter.   Right-handed.   1-2 cups coffee in the morning and 2 sodas per day.    Family History:    Family History  Problem Relation Age of Onset  . Coronary artery disease Father   . Emphysema Father   . Heart attack Father 26       Died age 69  . Stroke Father   . Cancer Father        Unsure of type   . Allergic rhinitis Father   . Depression Mother   . Cancer Mother        skin cancer  . Bipolar disorder Brother   . Drug abuse Brother   . Cancer Brother        leukemia  . Bipolar disorder Daughter   . Allergic rhinitis Daughter   . Asthma Daughter   . Urticaria Daughter   . Diabetes Maternal Grandmother   . Stomach cancer Maternal Grandfather   . Cancer Maternal Grandfather        stomach   . Heart attack Sister        Died in 4s  . Heart attack Brother        Died age 79  . Angioedema Neg Hx   . Atopy Neg Hx   . Eczema Neg Hx   . Immunodeficiency Neg Hx   . Breast cancer Neg Hx      ROS:  Please see the history of present illness.  ROS  All other ROS reviewed and negative.     Physical Exam/Data:   Vitals:   08/20/17 2039 08/21/17 0040 08/21/17 0534 08/21/17 0901  BP: (!) 181/124 (!) 160/113 (!) 182/114 (!) 192/118  Pulse: 96 95 82   Resp: 20 18 18    Temp: 98.6 F (37 C) 98.9 F (37.2 C) 98.2 F (36.8 C)   TempSrc: Oral Oral Oral   SpO2: 100% 100% 100%   Weight:      Height:        Intake/Output Summary (Last 24 hours) at 08/21/2017 0913 Last data filed at 08/21/2017 0855 Gross per 24 hour  Intake 740 ml  Output -  Net 740 ml   Filed Weights   08/20/17 1150  Weight: 220 lb (99.8 kg)   Body mass index is 35.51 kg/m.  GENERAL:  Agitated but otherwise moving quickly around the room without pain or limitation.  HEENT:   Pupils equal round and reactive, fundi not visualized, oral mucosa unremarkable NECK:  No  jugular venous distention, waveform within normal limits, carotid upstroke brisk and  symmetric, no bruits, no thyromegaly LYMPHATICS:  No cervical, inguinal adenopathy LUNGS:   Clear to auscultation bilaterally BACK:  No CVA tenderness CHEST:   Unremarkable HEART:  PMI not displaced or sustained,S1 and S2 within normal limits, no S3, no S4, no clicks, no rubs, no murmurs ABD:  Flat, positive bowel sounds normal in frequency in pitch, no bruits, no rebound, no guarding, no midline pulsatile mass, no hepatomegaly, no splenomegaly EXT:  2 plus pulses throughout, no  edema, no cyanosis no clubbing SKIN:  No rashes no nodules NEURO:  Cranial nerves II through XII grossly intact, motor grossly intact throughout PSYCH:     Cognitively intact, oriented to person place and time   EKG:  The EKG was personally reviewed and demonstrates:  NSR, rate 86, axis WNL, intervals WNL, no acute ST T wave changed.   Telemetry:  Telemetry was personally reviewed and demonstrates:  NSR  Relevant CV Studies: NA  Laboratory Data:  Chemistry Recent Labs  Lab 08/20/17 1204 08/21/17 0525  NA 141 141  K 3.8 4.2  CL 106 106  CO2 23 25  GLUCOSE 89 107*  BUN 14 13  CREATININE 0.80 0.94  CALCIUM 9.1 9.1  GFRNONAA >60 >60  GFRAA >60 >60  ANIONGAP 12 10    Recent Labs  Lab 08/20/17 1248 08/21/17 0525  PROT 6.7 6.7  ALBUMIN 3.6 3.7  AST 23 79*  ALT 17 63*  ALKPHOS 68 72  BILITOT 0.4 0.6   Hematology Recent Labs  Lab 08/20/17 1204 08/21/17 0525  WBC 10.5 10.8*  RBC 4.90 4.74  HGB 14.3 13.9  HCT 44.3 43.4  MCV 90.4 91.6  MCH 29.2 29.3  MCHC 32.3 32.0  RDW 14.7 14.8  PLT 303 290   Cardiac Enzymes Recent Labs  Lab 08/20/17 2143 08/21/17 0525  TROPONINI <0.03 <0.03    Recent Labs  Lab 08/20/17 1210 08/20/17 1607  TROPIPOC 0.01 0.00    BNPNo results for input(s): BNP, PROBNP in the last 168 hours.  DDimer No results for input(s): DDIMER in the last 168 hours.  Radiology/Studies:  Ct Abdomen Pelvis Wo Contrast  Result Date: 08/20/2017 CLINICAL DATA:  57 y/o  F; nausea, vomiting, and diarrhea since yesterday. Epigastric abdominal pain. EXAM: CT ABDOMEN AND PELVIS WITHOUT CONTRAST TECHNIQUE: Multidetector CT imaging of the abdomen and pelvis was performed following the standard protocol without IV contrast. COMPARISON:  10/14/2016 CT abdomen and pelvis FINDINGS: Lower chest: 3 mm left lung base stable pulmonary nodule compatible benign etiology. Hepatobiliary: No focal liver abnormality is seen. Status post cholecystectomy. Stable enlargement of the common bile duct to 15 mm, probably compensatory post cholecystectomy, no intrahepatic biliary ductal dilatation. Pancreas: Unremarkable. No pancreatic ductal dilatation or surrounding inflammatory changes. Spleen: Normal in size without focal abnormality. Adrenals/Urinary Tract: Adrenal glands are unremarkable. Kidneys are normal, without renal calculi, focal lesion, or hydronephrosis. Bladder is unremarkable. Stomach/Bowel: Stomach is within normal limits. Appendectomy. No evidence of bowel wall thickening, distention, or inflammatory changes. Vascular/Lymphatic: Aortic atherosclerosis. No enlarged abdominal or pelvic lymph nodes. Reproductive: Status post hysterectomy. No adnexal masses. Other: No abdominal wall hernia or abnormality. No abdominopelvic ascites. Musculoskeletal: L5-S1 posterior instrumentation fusion hardware without apparent hardware related complication. No acute fracture. IMPRESSION: 1. No acute process identified. 2. Aortic atherosclerosis. 3. Cholecystectomy. Stable prominent common bile duct, probably compensatory post cholecystectomy. No intrahepatic biliary ductal dilatation. Electronically Signed   By: Kristine Garbe M.D.   On: 08/20/2017 20:07   Dg Chest 2 View  Result Date: 08/20/2017 CLINICAL DATA:  Left-sided chest pain. EXAM: CHEST - 2 VIEW COMPARISON:  Chest x-ray dated May 16, 2017. FINDINGS: The heart size and mediastinal contours are within normal limits. Both lungs are  clear. The visualized skeletal structures are unremarkable. IMPRESSION: No active cardiopulmonary disease. Electronically Signed   By: Titus Dubin M.D.   On: 08/20/2017 12:24   Ct Head Wo Contrast  Result Date: 08/20/2017 CLINICAL DATA:  Hypertensive patient with headache. EXAM: CT HEAD WITHOUT CONTRAST TECHNIQUE: Contiguous axial images were obtained from the base of  the skull through the vertex without intravenous contrast. COMPARISON:  MR head 05/18/2017.  CT head 05/19/2017. FINDINGS: Brain: No evidence for acute infarction, hemorrhage, mass lesion, hydrocephalus, or extra-axial fluid. Normal cerebral volume. Subcortical BILATERAL supratentorial white matter signal abnormality, appears similar to prior MR, consistent with chronic microvascular ischemic change. Vascular: No hyperdense vessel or unexpected calcification. Skull: Normal. Negative for fracture or focal lesion. Sinuses/Orbits: No acute finding. Other: None. IMPRESSION: Chronic changes as described. No definite acute intracranial findings. See discussion above. If strong concern for PRES/hypertensive encephalopathy, MR is more sensitive. Electronically Signed   By: Staci Righter M.D.   On: 08/20/2017 14:50   Mr Brain Wo Contrast  Result Date: 08/20/2017 CLINICAL DATA:  57 year old female with headache. Chest pain and hypertension. EXAM: MRI HEAD WITHOUT CONTRAST TECHNIQUE: Multiplanar, multiecho pulse sequences of the brain and surrounding structures were obtained without intravenous contrast. COMPARISON:  Head CT without contrast 1418 hours today and earlier. [facility] Brain MRI 05/18/2017. FINDINGS: The study is intermittently degraded by motion artifact despite repeated imaging attempts. Brain: Cerebral volume remains within normal limits. No restricted diffusion to suggest acute infarction. No midline shift, mass effect, evidence of mass lesion, ventriculomegaly, extra-axial collection or acute intracranial hemorrhage.  Cervicomedullary junction and pituitary are within normal limits. Scattered chronic nonspecific cerebral white matter T2 and FLAIR hyperintensity appears stable. The extent is moderate for age. No cortical encephalomalacia or chronic cerebral blood products identified. The bilateral deep gray matter nuclei, the brainstem, and cerebellum remain normal. Vascular: Major intracranial vascular flow voids are stable. Skull and upper cervical spine: Stable bone marrow signal. Sinuses/Orbits: Stable and negative. Other: Trace left mastoid fluid is stable since 2018. A mild right mastoid effusion has developed. The nasopharynx appears stable and negative. Scalp and face soft tissues appear stable and negative. IMPRESSION: 1.  No acute intracranial abnormality. 2. Continued stable noncontrast MRI appearance of the brain since 2018, with chronic moderately advanced but nonspecific cerebral white matter signal changes. 3. New mild right mastoid effusion since 2018, probably postinflammatory and significance doubtful. Electronically Signed   By: Genevie Ann M.D.   On: 08/20/2017 19:22    Assessment and Plan:   CHEST PAIN:  No objective evidence if ischemia.  She had normal coronary arteries on cath in the past.  She has had a history of chronic chest pain.  Her pain currently is very atypical.  Exam is unremarkable.  I do not suspect an acute coronary syndrome.  I do not suspect aortic dissection as there is no clinical, radiologic or historical suggestion of this.  No further cardiac work up at this point.    HTN:  She is refusing meds per nursing notes.   This has been a significant problem.  She has at high risk for imminent problems with possible stroke, MI, dissection and more chronic progressive end organ damage.  However, none of this can be immediately affected by more testing or procedures.  Rather she needs to take the excellent med list as prescribed and consent to low salt heart healthy diet.  TOBACCO ABUSE:    She is aware of the importance of smoking cessation.     For questions or updates, please contact [place] Please consult [email or web address] for contact info under Cardiology/STEMI.   Signed, Minus Breeding, MD  08/21/2017 9:13 AM

## 2017-08-21 NOTE — Progress Notes (Signed)
NT notified RN that patient was "shaking".  RN went to check on patient, patient sitting on couch area rocking bath and forth and shaking all over.  RN asked about the shaking, patient says she has this at home and has been told it is due to her MS and will pass on its own.  Patient c/o nausea and feeling like food is getting stuck between her esophagus and her stomach.  Has had Zofran and GI cocktail without relief.  Pt. Also c/o headache but has refused Tylenol all day.  When asked what patient takes at home for her headaches she says she just has to "deal with them".  MD notified regarding continued nausea in spite of Zofran.

## 2017-08-21 NOTE — Progress Notes (Signed)
PROGRESS NOTE    Katrina [name]  MWN:[id number] DOB: [birth year] DOA: 08/20/2017 PCP: Berkley Harvey, NP  Brief Narrative:  Katrina [name] is a 57 y.o. female with medical history significant of Multiple comorbidities including Anxiety, Asthma, COPD, Headaches, HTN, HLD, GERD, RLS, PTSD, Depression, Hx of Diverticulitis, Insomnia, Hx of Bipolar, and other comorbidites who presented to the ED with CP, SOB, Back Pain and Headache. Patient states she has chronic back pain but started having Chest Pain that was intermittent lasting a minute, worse with exertion, that was substernal and left sided radiating into Neck and Arm. Subsequently she developed a headache and started having Nausea, Vomiting, Diarrhea. She stated when she developed the CP it was a 10/10 in intensity and now is a 5/10. States she was also SOB, lightheaded and diaphoretic. She denied any burning or discomfort in Urine. States BP has been extremely hard to control and states Renal artery has a "kink in it." Also admitted to having blurred vision. [facility] was called to admit patient for Chest Pain and HTN Emergency. BP remains uncontrolled and patient has been refusing Medications. Now complaining of dysphagia so will get Esophagram.   Assessment & Plan:   Active Problems:   GERD (gastroesophageal reflux disease)   Uncontrolled hypertension   Atypical chest pain   Precordial pain   Generalized anxiety disorder   Chronic bilateral low back pain without sciatica   History of cerebrovascular accident (CVA) with residual deficit   Cerebral microvascular disease   Hypertension   Chest pain in adult  Chest Pain r/o ACS in the setting of Hypertensive Emergency -Stated CP is intermittent and lasts 1 minute. Never this severe -CP Order Set -Cycle Cardiac Troponins and was <0.03 x 2 -Checked Mag and was 2.2, Phos was 4.8, TSH was 2.522, Lipid Panel normal -Refused NTG given worsening Headaches -Check UDS  -Start ASA 325 mg po Daily    -Cardiology Consulted for further evaluation and recommendations -Cardiology feels her CP is Atypical and do not suspect ACS or Aortic Dissection -GI Cocktail did not help; Will obtain Esophagram to evaluate patient's CP  Hypertensive Emergency with Headache, Improving -Restart Home BP Meds including Amlodipine 10 mg po Daily, Chlorthalidone 25 mg po BID, Atenolol 50 mg po Daily, Hydralazine 50 mg po TID, Losartan 50 mg po BID, and Spironolactone 100 mg po qHS; States her baseline BP remains in the 180's-190's -BP still High but patient refused Medications yesterday  -Start IV Hydralazine 10 mg IV q6hprn for SBP>180 or DBP>100 -Checked Head CT and showed and showed Chronic changes as described. No definite acute intracranial findings. -MRI Head done showed No acute intracranial abnormality. Continued stable noncontrast MRI appearance of the brain since 2018, with chronic moderately advanced but nonspecific cerebral white matter signal changes. New mild right mastoid effusion since 2018, probably postinflammatory and significance doubtful.  -Patient refusing medications and not compliant to a Low Salt Diet  -Advised Strong compliance with BP Medications   Nausea/Vomiting/Diarrhea -States she has been having significant amount -Checked  CT Abd/Pelvis showed No acute Process identified, Aortic Atherosclerosis, Cholecystectomy and Stable prominent common bile duct, probably compensatory post cholecystectomy. No intrahepatic biliary ductal dilatation. -Lipase Level was 22 -Possible Viral in Etiology given patient is Afebrile and No WBC -Continue to Monitor Closely   Dysphagia -Patient feels as if she has Dysphagia and may be contributing to her Chest/Abdominal Pain -GI Cocktail did not help -Check Esophagram -SLP to Evaluate pending Esophagram  Hx of CVA with  Residual Deficits -C/w Plavix 75 mg po Daily   Generalized Anxiety Disorder -Start Alprazolam 0.25 mg po BIDprn  COPD  -C/w  Home Nebulizer Tx with Budesonide 1 mg BID   GERD -C/w PPI  RLS -C/w Requip  Abnormal LFT's -AST went from 23 -> 79 and ALT went from 17 -> 63 -Continue to Monitor and if Worsening Check RUQ U/S and Acute Hepatitis Panel -Repeat CMP in AM   Tobacco Abuse -Patient Counseled on Smoking Cessastion   DVT prophylaxis: Enoxaparin 40 mg sq q24h Code Status: DO NOT RESUSCITATE Family Communication: No family present at bedside Disposition Plan: Anticipate D/C in next 24-48 hours  Consultants:   Cardiology Dr. Percival Spanish   Procedures: None  -Esophagram ordered   Antimicrobials:  Antibiotics Given (last 72 hours)    None     Subjective: Seen and examined and CP had improved but now complaining of some Dysphagia and mid-epigastric Chest Pain. Felt Nauseous this AM. Per nursing patient is non-compliant with her BP medications and also ate a Pizza last night because did not want to adhere to a Heart Healthy Diet.   Objective: Vitals:   08/21/17 1225 08/21/17 1328 08/21/17 1436 08/21/17 1737  BP: (!) 190/120 (!) 196/107 (!) 200/110 (!) 189/103  Pulse:  94  93  Resp:  20  16  Temp:  98 F (36.7 C)  98.6 F (37 C)  TempSrc:  Oral  Oral  SpO2:  100%  96%  Weight:      Height:        Intake/Output Summary (Last 24 hours) at 08/21/2017 1852 Last data filed at 08/21/2017 1330 Gross per 24 hour  Intake 480 ml  Output -  Net 480 ml   Filed Weights   08/20/17 1150  Weight: 99.8 kg (220 lb)   Examination: Physical Exam:  Constitutional: WN/WD obese Caucasian female who remains anxious Eyes: Lids and conjunctivae normal, sclerae anicteric  ENMT: External Ears, Nose appear normal. Grossly normal hearing. Mucous membranes are moist. Neck: Appears normal, supple, no cervical masses, normal ROM, no appreciable thyromegaly, no JVD Respiratory: Diminished to auscultation bilaterally, no wheezing, rales, rhonchi or crackles. Normal respiratory effort and patient is not  tachypenic. No accessory muscle use.  Cardiovascular: RRR, no murmurs / rubs / gallops. S1 and S2 auscultated. 1+ LE extremity edema.  Abdomen: Soft, non-tender, non-distended. No masses palpated. No appreciable hepatosplenomegaly. Bowel sounds positive x4.  GU: Deferred. Musculoskeletal: No clubbing / cyanosis of digits/nails. No joint deformity upper and lower extremities.  Skin: No rashes, lesions, ulcers on a limited skin eval. No induration; Warm and dry. Has multiple tattoos scattered throughout the body.  Neurologic: CN 2-12 grossly intact with no focal deficits.  Romberg sign and cerebellar reflexes not assessed.  Psychiatric: Normal judgment and insight. Alert and oriented x 3. Normal mood and appropriate affect.   Data Reviewed: I have personally reviewed following labs and imaging studies  CBC: Recent Labs  Lab 08/20/17 1204 08/21/17 0525  WBC 10.5 10.8*  NEUTROABS  --  7.6  HGB 14.3 13.9  HCT 44.3 43.4  MCV 90.4 91.6  PLT 303 400   Basic Metabolic Panel: Recent Labs  Lab 08/20/17 1204 08/21/17 0525  NA 141 141  K 3.8 4.2  CL 106 106  CO2 23 25  GLUCOSE 89 107*  BUN 14 13  CREATININE 0.80 0.94  CALCIUM 9.1 9.1  MG  --  2.2  PHOS  --  4.8*   GFR: Estimated Creatinine  Clearance: 79.6 mL/min (by C-G formula based on SCr of 0.94 mg/dL). Liver Function Tests: Recent Labs  Lab 08/20/17 1248 08/21/17 0525  AST 23 79*  ALT 17 63*  ALKPHOS 68 72  BILITOT 0.4 0.6  PROT 6.7 6.7  ALBUMIN 3.6 3.7   Recent Labs  Lab 08/20/17 1300  LIPASE 22   No results for input(s): AMMONIA in the last 168 hours. Coagulation Profile: No results for input(s): INR, PROTIME in the last 168 hours. Cardiac Enzymes: Recent Labs  Lab 08/20/17 2143 08/21/17 0525  TROPONINI <0.03 <0.03   BNP (last 3 results) No results for input(s): PROBNP in the last 8760 hours. HbA1C: No results for input(s): HGBA1C in the last 72 hours. CBG: No results for input(s): GLUCAP in the last  168 hours. Lipid Profile: Recent Labs    08/21/17 0525  CHOL 140  HDL 30*  LDLCALC 82  TRIG 142  CHOLHDL 4.7   Thyroid Function Tests: Recent Labs    08/21/17 0525  TSH 2.522   Anemia Panel: No results for input(s): VITAMINB12, FOLATE, FERRITIN, TIBC, IRON, RETICCTPCT in the last 72 hours. Sepsis Labs: No results for input(s): PROCALCITON, LATICACIDVEN in the last 168 hours.  Recent Results (from the past 240 hour(s))  MRSA PCR Screening     Status: None   Collection Time: 08/21/17 10:29 AM  Result Value Ref Range Status   MRSA by PCR NEGATIVE NEGATIVE Final    Comment:        The GeneXpert MRSA Assay (FDA approved for NASAL specimens only), is one component of a comprehensive MRSA colonization surveillance program. It is not intended to diagnose MRSA infection nor to guide or monitor treatment for MRSA infections. Performed at [facility], [place] [address], [place], [place] [address]     Radiology Studies: Ct Abdomen Pelvis Wo Contrast  Result Date: 08/20/2017 CLINICAL DATA:  57 y/o F; nausea, vomiting, and diarrhea since yesterday. Epigastric abdominal pain. EXAM: CT ABDOMEN AND PELVIS WITHOUT CONTRAST TECHNIQUE: Multidetector CT imaging of the abdomen and pelvis was performed following the standard protocol without IV contrast. COMPARISON:  10/14/2016 CT abdomen and pelvis FINDINGS: Lower chest: 3 mm left lung base stable pulmonary nodule compatible benign etiology. Hepatobiliary: No focal liver abnormality is seen. Status post cholecystectomy. Stable enlargement of the common bile duct to 15 mm, probably compensatory post cholecystectomy, no intrahepatic biliary ductal dilatation. Pancreas: Unremarkable. No pancreatic ductal dilatation or surrounding inflammatory changes. Spleen: Normal in size without focal abnormality. Adrenals/Urinary Tract: Adrenal glands are unremarkable. Kidneys are normal, without renal calculi, focal lesion, or hydronephrosis.  Bladder is unremarkable. Stomach/Bowel: Stomach is within normal limits. Appendectomy. No evidence of bowel wall thickening, distention, or inflammatory changes. Vascular/Lymphatic: Aortic atherosclerosis. No enlarged abdominal or pelvic lymph nodes. Reproductive: Status post hysterectomy. No adnexal masses. Other: No abdominal wall hernia or abnormality. No abdominopelvic ascites. Musculoskeletal: L5-S1 posterior instrumentation fusion hardware without apparent hardware related complication. No acute fracture. IMPRESSION: 1. No acute process identified. 2. Aortic atherosclerosis. 3. Cholecystectomy. Stable prominent common bile duct, probably compensatory post cholecystectomy. No intrahepatic biliary ductal dilatation. Electronically Signed   By: Kristine Garbe M.D.   On: 08/20/2017 20:07   Dg Chest 2 View  Result Date: 08/20/2017 CLINICAL DATA:  Left-sided chest pain. EXAM: CHEST - 2 VIEW COMPARISON:  Chest x-ray dated May 16, 2017. FINDINGS: The heart size and mediastinal contours are within normal limits. Both lungs are clear. The visualized skeletal structures are unremarkable. IMPRESSION: No active cardiopulmonary  disease. Electronically Signed   By: Titus Dubin M.D.   On: 08/20/2017 12:24   Ct Head Wo Contrast  Result Date: 08/20/2017 CLINICAL DATA:  Hypertensive patient with headache. EXAM: CT HEAD WITHOUT CONTRAST TECHNIQUE: Contiguous axial images were obtained from the base of the skull through the vertex without intravenous contrast. COMPARISON:  MR head 05/18/2017.  CT head 05/19/2017. FINDINGS: Brain: No evidence for acute infarction, hemorrhage, mass lesion, hydrocephalus, or extra-axial fluid. Normal cerebral volume. Subcortical BILATERAL supratentorial white matter signal abnormality, appears similar to prior MR, consistent with chronic microvascular ischemic change. Vascular: No hyperdense vessel or unexpected calcification. Skull: Normal. Negative for fracture or focal  lesion. Sinuses/Orbits: No acute finding. Other: None. IMPRESSION: Chronic changes as described. No definite acute intracranial findings. See discussion above. If strong concern for PRES/hypertensive encephalopathy, MR is more sensitive. Electronically Signed   By: Staci Righter M.D.   On: 08/20/2017 14:50   Mr Brain Wo Contrast  Result Date: 08/20/2017 CLINICAL DATA:  57 year old female with headache. Chest pain and hypertension. EXAM: MRI HEAD WITHOUT CONTRAST TECHNIQUE: Multiplanar, multiecho pulse sequences of the brain and surrounding structures were obtained without intravenous contrast. COMPARISON:  Head CT without contrast 1418 hours today and earlier. [facility] Brain MRI 05/18/2017. FINDINGS: The study is intermittently degraded by motion artifact despite repeated imaging attempts. Brain: Cerebral volume remains within normal limits. No restricted diffusion to suggest acute infarction. No midline shift, mass effect, evidence of mass lesion, ventriculomegaly, extra-axial collection or acute intracranial hemorrhage. Cervicomedullary junction and pituitary are within normal limits. Scattered chronic nonspecific cerebral white matter T2 and FLAIR hyperintensity appears stable. The extent is moderate for age. No cortical encephalomalacia or chronic cerebral blood products identified. The bilateral deep gray matter nuclei, the brainstem, and cerebellum remain normal. Vascular: Major intracranial vascular flow voids are stable. Skull and upper cervical spine: Stable bone marrow signal. Sinuses/Orbits: Stable and negative. Other: Trace left mastoid fluid is stable since 2018. A mild right mastoid effusion has developed. The nasopharynx appears stable and negative. Scalp and face soft tissues appear stable and negative. IMPRESSION: 1.  No acute intracranial abnormality. 2. Continued stable noncontrast MRI appearance of the brain since 2018, with chronic moderately advanced but nonspecific cerebral  white matter signal changes. 3. New mild right mastoid effusion since 2018, probably postinflammatory and significance doubtful. Electronically Signed   By: Genevie Ann M.D.   On: 08/20/2017 19:22   Scheduled Meds: . amLODipine  10 mg Oral Daily  . atenolol  50 mg Oral Daily  . budesonide (PULMICORT) nebulizer solution  1 mg Nebulization BID  . chlorthalidone  25 mg Oral BID  . clopidogrel  75 mg Oral Daily  . enoxaparin (LOVENOX) injection  40 mg Subcutaneous Q24H  . gabapentin  600 mg Oral QID  . hydrALAZINE  50 mg Oral TID  . losartan  50 mg Oral BID  . pantoprazole  40 mg Oral Daily  . rOPINIRole  4 mg Oral QHS  . spironolactone  100 mg Oral QPM  . tiotropium  18 mcg Inhalation Daily   Continuous Infusions:   LOS: 0 days   Kerney Elbe, DO [facility] Pager [phone]  If 7PM-7AM, please contact night-coverage [email or web address] Password [facility] 08/21/2017, 6:52 PM

## 2017-08-22 ENCOUNTER — Observation Stay (HOSPITAL_COMMUNITY): Payer: Medicare Other

## 2017-08-22 DIAGNOSIS — R079 Chest pain, unspecified: Secondary | ICD-10-CM | POA: Diagnosis not present

## 2017-08-22 DIAGNOSIS — I10 Essential (primary) hypertension: Secondary | ICD-10-CM | POA: Diagnosis not present

## 2017-08-22 DIAGNOSIS — I6930 Unspecified sequelae of cerebral infarction: Secondary | ICD-10-CM | POA: Diagnosis not present

## 2017-08-22 DIAGNOSIS — R072 Precordial pain: Secondary | ICD-10-CM | POA: Diagnosis not present

## 2017-08-22 DIAGNOSIS — I151 Hypertension secondary to other renal disorders: Secondary | ICD-10-CM | POA: Diagnosis not present

## 2017-08-22 DIAGNOSIS — K219 Gastro-esophageal reflux disease without esophagitis: Secondary | ICD-10-CM | POA: Diagnosis not present

## 2017-08-22 DIAGNOSIS — I160 Hypertensive urgency: Secondary | ICD-10-CM

## 2017-08-22 DIAGNOSIS — I161 Hypertensive emergency: Secondary | ICD-10-CM | POA: Diagnosis not present

## 2017-08-22 DIAGNOSIS — R1310 Dysphagia, unspecified: Secondary | ICD-10-CM | POA: Diagnosis not present

## 2017-08-22 DIAGNOSIS — R51 Headache: Secondary | ICD-10-CM | POA: Diagnosis not present

## 2017-08-22 DIAGNOSIS — I679 Cerebrovascular disease, unspecified: Secondary | ICD-10-CM | POA: Diagnosis not present

## 2017-08-22 DIAGNOSIS — M545 Low back pain: Secondary | ICD-10-CM | POA: Diagnosis not present

## 2017-08-22 DIAGNOSIS — R0789 Other chest pain: Secondary | ICD-10-CM | POA: Diagnosis not present

## 2017-08-22 LAB — COMPREHENSIVE METABOLIC PANEL
ALBUMIN: 3.4 g/dL — AB (ref 3.5–5.0)
ALK PHOS: 71 U/L (ref 38–126)
ALT: 42 U/L (ref 14–54)
ANION GAP: 8 (ref 5–15)
AST: 30 U/L (ref 15–41)
BILIRUBIN TOTAL: 0.3 mg/dL (ref 0.3–1.2)
BUN: 16 mg/dL (ref 6–20)
CALCIUM: 8.9 mg/dL (ref 8.9–10.3)
CO2: 25 mmol/L (ref 22–32)
CREATININE: 0.93 mg/dL (ref 0.44–1.00)
Chloride: 107 mmol/L (ref 101–111)
GFR calc Af Amer: >60 mL/min (ref >60)
GFR calc non Af Amer: >60 mL/min (ref >60)
GLUCOSE: 104 mg/dL — AB (ref 65–99)
Potassium: 3.9 mmol/L (ref 3.5–5.1)
Sodium: 140 mmol/L (ref 135–145)
Total Protein: 6.5 g/dL (ref 6.5–8.1)

## 2017-08-22 LAB — CBC WITH DIFFERENTIAL/PLATELET
Basophils Absolute: 0 10*3/uL (ref 0.0–0.1)
Basophils Relative: 0 %
Eosinophils Absolute: 0.2 10*3/uL (ref 0.0–0.7)
Eosinophils Relative: 3 %
HEMATOCRIT: 42.5 % (ref 36.0–46.0)
HEMOGLOBIN: 13.4 g/dL (ref 12.0–15.0)
LYMPHS ABS: 3.2 10*3/uL (ref 0.7–4.0)
Lymphocytes Relative: 45 %
MCH: 29.1 pg (ref 26.0–34.0)
MCHC: 31.5 g/dL (ref 30.0–36.0)
MCV: 92.4 fL (ref 78.0–100.0)
MONOS PCT: 7 %
Monocytes Absolute: 0.5 10*3/uL (ref 0.1–1.0)
NEUTROS ABS: 3.2 10*3/uL (ref 1.7–7.7)
NEUTROS PCT: 45 %
Platelets: 275 10*3/uL (ref 150–400)
RBC: 4.6 MIL/uL (ref 3.87–5.11)
RDW: 15.3 % (ref 11.5–15.5)
WBC: 7.2 10*3/uL (ref 4.0–10.5)

## 2017-08-22 LAB — PHOSPHORUS: Phosphorus: 4.4 mg/dL (ref 2.5–4.6)

## 2017-08-22 LAB — MAGNESIUM: Magnesium: 2.2 mg/dL (ref 1.7–2.4)

## 2017-08-22 NOTE — Progress Notes (Signed)
PROGRESS NOTE    Katrina [name]  QPY:[id number] DOB: 08/19/1960 DOA: 08/20/2017 PCP: Berkley Harvey, NP  Brief Narrative:  Katrina [name] is a 57 y.o. female with medical history significant of Multiple comorbidities including Anxiety, Asthma, COPD, Headaches, HTN, HLD, GERD, RLS, PTSD, Depression, Hx of Diverticulitis, Insomnia, Hx of Bipolar, and other comorbidites who presented to the ED with CP, SOB, Back Pain and Headache. Patient states she has chronic back pain but started having Chest Pain that was intermittent lasting a minute, worse with exertion, that was substernal and left sided radiating into Neck and Arm. Subsequently she developed a headache and started having Nausea, Vomiting, Diarrhea. She stated when she developed the CP it was a 10/10 in intensity and now is a 5/10. States she was also SOB, lightheaded and diaphoretic. She denied any burning or discomfort in Urine. States BP has been extremely hard to control and states Renal artery has a "kink in it." Also admitted to having blurred vision. [facility] was called to admit patient for Chest Pain and HTN Emergency. BP remains uncontrolled and patient has been refusing Medications. Was complaining of dysphagia obtained Esophagram.   Assessment & Plan:   Active Problems:   GERD (gastroesophageal reflux disease)   Uncontrolled hypertension   Atypical chest pain   Precordial pain   Generalized anxiety disorder   Chronic bilateral low back pain without sciatica   History of cerebrovascular accident (CVA) with residual deficit   Cerebral microvascular disease   Hypertension   Chest pain in adult  Chest Pain r/o ACS in the setting of Hypertensive Emergency -Stated CP is intermittent and lasts 1 minute. Never this severe and was intermittent over night  -CP Order Set -Cycle Cardiac Troponins and was <0.03 x 2 -Checked Mag and was 2.2, Phos was 4.8, TSH was 2.522, Lipid Panel normal -Refused NTG given worsening Headaches -Checked UDS   And ws Positive for Opiates -Start ASA 325 mg po Daily  -Cardiology Consulted for further evaluation and recommendations -Cardiology feels her CP is Atypical and do not suspect ACS or Aortic Dissection -GI Cocktail did not help; Obtain Esophagram to evaluate patient's CP/Dysphagia and showed Patient had sensation of gagging with ingestion of barium. Normal primary esophageal stripping wave. No esophageal obstructing lesion noted. Mild smooth narrowing of the distal esophagus. 13 mm barium tablet becomes temporarily lodged at this level but immediately clears upon subsequent ingestion of liquid. -Will have to have out patient GI Evaluation and likely will D/C in AM    Hypertensive Emergency with Headache, Improving -Restart Home BP Meds including Amlodipine 10 mg po Daily, Chlorthalidone 25 mg po BID, Atenolol 50 mg po Daily, Hydralazine 50 mg po TID, Losartan 50 mg po BID, and Spironolactone 100 mg po qHS; States her baseline BP remains in the 180's-190's -BP now 184/99 -BP still High but patient refused Medications on night of admission -Start IV Hydralazine 10 mg IV q6hprn for SBP>180 or DBP>100 -Checked Head CT and showed and showed Chronic changes as described. No definite acute intracranial findings. -MRI Head done showed No acute intracranial abnormality. Continued stable noncontrast MRI appearance of the brain since 2018, with chronic moderately advanced but nonspecific cerebral white matter signal changes. New mild right mastoid effusion since 2018, probably postinflammatory and significance doubtful.  -Patient was refusing medications intermittently and not compliant to a Low Salt Diet  -Advised Strong compliance with BP Medications   Nausea/Vomiting/Diarrhea, improved -States she has been having significant amount -Checked CT Abd/Pelvis showed  No acute Process identified, Aortic Atherosclerosis, Cholecystectomy and Stable prominent common bile duct, probably compensatory post  cholecystectomy. No intrahepatic biliary ductal dilatation. -Lipase Level was 22 -Possible Viral in Etiology given patient is Afebrile and No WBC -Continue to Monitor Closely  -Improved   Dysphagia -Patient feels as if she has Dysphagia and may be contributing to her Chest/Abdominal Pain -GI Cocktail did not help -Checked Patient had sensation of gagging with ingestion of barium. Normal primary esophageal stripping wave. No esophageal obstructing lesion noted. Mild smooth narrowing of the distal esophagus. 13 mm barium tablet becomes temporarily lodged at this level but immediately clears upon subsequent ingestion of liquid. -SLP to Evaluate pending Esophagram  Hx of CVA with Residual Deficits -C/w Plavix 75 mg po Daily   Generalized Anxiety Disorder -Start Alprazolam 0.25 mg po BIDprn  COPD  -C/w Home Nebulizer Tx with Budesonide 1 mg BID   GERD -C/w PPI  RLS -C/w Requip  Abnormal LFT's, improved  -AST went from 23 -> 79 -> 30 and ALT went from 17 -> 63 -> 42 -Continue to Monitor and if Worsening Check RUQ U/S and Acute Hepatitis Panel -Will not Repeat CMP in AM now that are improved  Tobacco Abuse -Patient Counseled on Smoking Cessastion  DVT prophylaxis: Enoxaparin 40 mg sq q24h Code Status: DO NOT RESUSCITATE Family Communication: No family present at bedside Disposition Plan: Anticipate D/C in next 24-48 hours  Consultants:   Cardiology Dr. Percival Spanish   Procedures: Esophagram   Antimicrobials:  Antibiotics Given (last 72 hours)    None     Subjective: Seen and examined and stated CP/Discomfort has been intermittent and that she still has a headache but that's chronic for her. No nausea or vomiting still feels like she has a "knot" in her stomach. No lightheadedness today.   Objective: Vitals:   08/21/17 2021 08/22/17 0550 08/22/17 1416 08/22/17 1521  BP: (!) 169/110 (!) 170/111 (!) 226/124 (!) 184/99  Pulse: 86 93 89 91  Resp: 20 20 20 20   Temp:  98.4 F (36.9 C) 98.3 F (36.8 C) 98.7 F (37.1 C) 97.9 F (36.6 C)  TempSrc: Oral Oral Oral Oral  SpO2: 96% 97% 98% 99%  Weight:      Height:       No intake or output data in the 24 hours ending 08/22/17 2050 Filed Weights   08/20/17 1150  Weight: 99.8 kg (220 lb)   Examination: Physical Exam:  Constitutional: WN/WD obese Caucasian female who remains anxious  Eyes: Sclerae anicteric. Lids and conjunctivae normal ENMT: External Ears and nose appear normal Neck: Supple with no JVD Respiratory: Diminished to auscultation bilaterally; No appreciable wheezing/rales or rhonchi Cardiovascular: RRR; No m/r/g. Trace LE edema Abdomen: Soft, NT, ND. Bowel sounds present  GU: Deferred Musculoskeletal: No contractures; No cyanosis Skin: Warm and Dry; No appreciable rashes or lesions on a limited skin eval Neurologic: CN 2-12 grossly intact. No appreciable focal deficits Psychiatric: Normal judgement and insight. Alert and oriented x3. Anxious Mood  Data Reviewed: I have personally reviewed following labs and imaging studies  CBC: Recent Labs  Lab 08/20/17 1204 08/21/17 0525 08/22/17 0507  WBC 10.5 10.8* 7.2  NEUTROABS  --  7.6 3.2  HGB 14.3 13.9 13.4  HCT 44.3 43.4 42.5  MCV 90.4 91.6 92.4  PLT 303 290 993   Basic Metabolic Panel: Recent Labs  Lab 08/20/17 1204 08/21/17 0525 08/22/17 0507  NA 141 141 140  K 3.8 4.2 3.9  CL 106 106  107  CO2 23 25 25   GLUCOSE 89 107* 104*  BUN 14 13 16   CREATININE 0.80 0.94 0.93  CALCIUM 9.1 9.1 8.9  MG  --  2.2 2.2  PHOS  --  4.8* 4.4   GFR: Estimated Creatinine Clearance: 80.5 mL/min (by C-G formula based on SCr of 0.93 mg/dL). Liver Function Tests: Recent Labs  Lab 08/20/17 1248 08/21/17 0525 08/22/17 0507  AST 23 79* 30  ALT 17 63* 42  ALKPHOS 68 72 71  BILITOT 0.4 0.6 0.3  PROT 6.7 6.7 6.5  ALBUMIN 3.6 3.7 3.4*   Recent Labs  Lab 08/20/17 1300  LIPASE 22   No results for input(s): AMMONIA in the last 168  hours. Coagulation Profile: No results for input(s): INR, PROTIME in the last 168 hours. Cardiac Enzymes: Recent Labs  Lab 08/20/17 2143 08/21/17 0525  TROPONINI <0.03 <0.03   BNP (last 3 results) No results for input(s): PROBNP in the last 8760 hours. HbA1C: No results for input(s): HGBA1C in the last 72 hours. CBG: No results for input(s): GLUCAP in the last 168 hours. Lipid Profile: Recent Labs    08/21/17 0525  CHOL 140  HDL 30*  LDLCALC 82  TRIG 142  CHOLHDL 4.7   Thyroid Function Tests: Recent Labs    08/21/17 0525  TSH 2.522   Anemia Panel: No results for input(s): VITAMINB12, FOLATE, FERRITIN, TIBC, IRON, RETICCTPCT in the last 72 hours. Sepsis Labs: No results for input(s): PROCALCITON, LATICACIDVEN in the last 168 hours.  Recent Results (from the past 240 hour(s))  MRSA PCR Screening     Status: None   Collection Time: 08/21/17 10:29 AM  Result Value Ref Range Status   MRSA by PCR NEGATIVE NEGATIVE Final    Comment:        The GeneXpert MRSA Assay (FDA approved for NASAL specimens only), is one component of a comprehensive MRSA colonization surveillance program. It is not intended to diagnose MRSA infection nor to guide or monitor treatment for MRSA infections. Performed at [facility], [place] [address], [place], [place] [address]     Radiology Studies: Dg Esophagus  Result Date: 08/22/2017 CLINICAL DATA:  57 year old female with dysphagia. Initial encounter. EXAM: ESOPHOGRAM/BARIUM SWALLOW TECHNIQUE: Single contrast examination was performed using  thin barium. FLUOROSCOPY TIME:  Fluoroscopy Time:  1 minutes and 12 seconds. Radiation Exposure Index: 14.5 mGy COMPARISON:  07/19/2017 chest CT. FINDINGS: Examination was tailored to the patient. Single contrast technique was utilized. No laryngeal penetration or aspiration. Normal primary esophageal stripping wave. Patient had sensation of gagging with ingestion of barium. No  esophageal mucosa abnormality, reflux or mass identified. Mild smooth narrowing of the distal esophagus. A 13 mm barium tablet became temporarily lodged at this level but cleared immediately with subsequent swallowing. No reflux demonstrated. IMPRESSION: Patient had sensation of gagging with ingestion of barium. Normal primary esophageal stripping wave. No esophageal obstructing lesion noted. Mild smooth narrowing of the distal esophagus. 13 mm barium tablet becomes temporarily lodged at this level but immediately clears upon subsequent ingestion of liquid. Electronically Signed   By: Genia Del M.D.   On: 08/22/2017 16:37   Scheduled Meds: . amLODipine  10 mg Oral Daily  . atenolol  50 mg Oral Daily  . budesonide (PULMICORT) nebulizer solution  1 mg Nebulization BID  . chlorthalidone  25 mg Oral BID  . clopidogrel  75 mg Oral Daily  . enoxaparin (LOVENOX) injection  40 mg Subcutaneous Q24H  . gabapentin  600 mg Oral QID  . hydrALAZINE  50 mg Oral TID  . losartan  50 mg Oral BID  . pantoprazole  40 mg Oral Daily  . rOPINIRole  4 mg Oral QHS  . spironolactone  100 mg Oral QPM  . tiotropium  18 mcg Inhalation Daily   Continuous Infusions:   LOS: 0 days   Kerney Elbe, DO [facility] Pager [phone]  If 7PM-7AM, please contact night-coverage [email or web address] Password [facility] 08/22/2017, 8:50 PM

## 2017-08-22 NOTE — Care Management Obs Status (Signed)
[place] NOTIFICATION   Patient Details  Name: KHERINGTON MERAZ MRN: [id number] Date of Birth: December 15, 1960   Medicare Observation Status Notification Given:  Yes    MahabirJuliann Pulse, RN 08/22/2017, 10:07 AM

## 2017-08-23 DIAGNOSIS — I151 Hypertension secondary to other renal disorders: Secondary | ICD-10-CM | POA: Diagnosis not present

## 2017-08-23 DIAGNOSIS — M545 Low back pain: Secondary | ICD-10-CM | POA: Diagnosis not present

## 2017-08-23 DIAGNOSIS — R112 Nausea with vomiting, unspecified: Secondary | ICD-10-CM | POA: Diagnosis not present

## 2017-08-23 DIAGNOSIS — R072 Precordial pain: Secondary | ICD-10-CM | POA: Diagnosis not present

## 2017-08-23 DIAGNOSIS — R079 Chest pain, unspecified: Secondary | ICD-10-CM | POA: Diagnosis not present

## 2017-08-23 DIAGNOSIS — K219 Gastro-esophageal reflux disease without esophagitis: Secondary | ICD-10-CM | POA: Diagnosis not present

## 2017-08-23 DIAGNOSIS — R0789 Other chest pain: Secondary | ICD-10-CM | POA: Diagnosis not present

## 2017-08-23 DIAGNOSIS — R51 Headache: Secondary | ICD-10-CM | POA: Diagnosis not present

## 2017-08-23 DIAGNOSIS — I679 Cerebrovascular disease, unspecified: Secondary | ICD-10-CM | POA: Diagnosis not present

## 2017-08-23 DIAGNOSIS — I6930 Unspecified sequelae of cerebral infarction: Secondary | ICD-10-CM | POA: Diagnosis not present

## 2017-08-23 DIAGNOSIS — I161 Hypertensive emergency: Secondary | ICD-10-CM | POA: Diagnosis not present

## 2017-08-23 DIAGNOSIS — R1310 Dysphagia, unspecified: Secondary | ICD-10-CM | POA: Diagnosis not present

## 2017-08-23 DIAGNOSIS — I10 Essential (primary) hypertension: Secondary | ICD-10-CM | POA: Diagnosis not present

## 2017-08-23 LAB — COMPREHENSIVE METABOLIC PANEL
ALT: 31 U/L (ref 14–54)
AST: 21 U/L (ref 15–41)
Albumin: 3.3 g/dL — ABNORMAL LOW (ref 3.5–5.0)
Alkaline Phosphatase: 68 U/L (ref 38–126)
Anion gap: 8 (ref 5–15)
BILIRUBIN TOTAL: 0.4 mg/dL (ref 0.3–1.2)
BUN: 16 mg/dL (ref 6–20)
CO2: 26 mmol/L (ref 22–32)
CREATININE: 0.82 mg/dL (ref 0.44–1.00)
Calcium: 8.7 mg/dL — ABNORMAL LOW (ref 8.9–10.3)
Chloride: 107 mmol/L (ref 101–111)
Glucose, Bld: 99 mg/dL (ref 65–99)
Potassium: 4.3 mmol/L (ref 3.5–5.1)
Sodium: 141 mmol/L (ref 135–145)
TOTAL PROTEIN: 6.1 g/dL — AB (ref 6.5–8.1)

## 2017-08-23 LAB — CBC WITH DIFFERENTIAL/PLATELET
BASOS ABS: 0 10*3/uL (ref 0.0–0.1)
Basophils Relative: 0 %
Eosinophils Absolute: 0.2 10*3/uL (ref 0.0–0.7)
Eosinophils Relative: 3 %
HEMATOCRIT: 41.4 % (ref 36.0–46.0)
Hemoglobin: 13 g/dL (ref 12.0–15.0)
LYMPHS ABS: 2.9 10*3/uL (ref 0.7–4.0)
LYMPHS PCT: 41 %
MCH: 28.9 pg (ref 26.0–34.0)
MCHC: 31.4 g/dL (ref 30.0–36.0)
MCV: 92 fL (ref 78.0–100.0)
MONO ABS: 0.4 10*3/uL (ref 0.1–1.0)
MONOS PCT: 6 %
Neutro Abs: 3.5 10*3/uL (ref 1.7–7.7)
Neutrophils Relative %: 50 %
Platelets: 278 10*3/uL (ref 150–400)
RBC: 4.5 MIL/uL (ref 3.87–5.11)
RDW: 15.1 % (ref 11.5–15.5)
WBC: 7 10*3/uL (ref 4.0–10.5)

## 2017-08-23 LAB — MAGNESIUM: MAGNESIUM: 2 mg/dL (ref 1.7–2.4)

## 2017-08-23 LAB — PHOSPHORUS: Phosphorus: 3.6 mg/dL (ref 2.5–4.6)

## 2017-08-23 NOTE — Discharge Summary (Signed)
Physician Discharge Summary  Katrina [name] FXT:[id number] DOB: [birth year] DOA: 08/20/2017  PCP: Berkley Harvey, NP  Admit date: 08/20/2017 Discharge date: 08/23/2017  Admitted From: Home Disposition: Home  Recommendations for Outpatient Follow-up:  1. Follow up with PCP in 1-2 weeks 2. Follow up with [facility] for Evaluation of Dysphagia and potential EGD 3. Follow up with Cardiology Dr. Bronson Ing as an outpatient for Further blood Pressure Meds 4. Follow up with Nephrology Dr. Hollie Salk as an outpatient  5. Please obtain CMP/CBC, Mag, Phos in one week 6. Please follow up on the following pending results:  Home Health: No Equipment/Devices: None   Discharge Condition: Guarded CODE STATUS: FULL CODE Diet recommendation: Heart Healthy Low Salt Diet (Unlikely she will remain compliant)  Brief/Interim Summary: Katrina L Hankinsis a 57 y.o.femalewith medical history significant ofMultiple comorbidities including Anxiety, Asthma, COPD, Headaches, HTN, HLD, GERD, RLS, PTSD, Depression, Hx of Diverticulitis, Insomnia, Hx of Bipolar, and other comorbidites who presented to the ED with CP, SOB, Back Pain and Headache. Patient states she has chronic back pain but started having Chest Pain that was intermittent lasting a minute, worse with exertion, that was substernal and left sided radiating into Neck and Arm. Subsequently she developed a headache and started having Nausea, Vomiting, Diarrhea. She stated when she developed the CP it was a 10/10 in intensity and now is a 5/10. States she was also SOB, lightheaded and diaphoretic. She denied any burning or discomfort in Urine. States BP has been extremely hard to control and states Renal artery has a "kink in it." Also admitted to having blurred vision. TRH was called to admit patient for Chest Pain and HTN Emergency. BP remains uncontrolled and patient has been refusing Medications. Was complaining of dysphagia obtained Esophagram which  was essentially normal. Patient started refused medications and compliance with treatment and will D/C Home and follow up with PCP, Cardiology, Gastroenterology, and Nephrology at D/C.   Discharge Diagnoses:  Active Problems:   GERD (gastroesophageal reflux disease)   Uncontrolled hypertension   Atypical chest pain   Precordial pain   Generalized anxiety disorder   Chronic bilateral low back pain without sciatica   History of cerebrovascular accident (CVA) with residual deficit   Cerebral microvascular disease   Hypertension   Chest pain in adult  Chest Pain r/o ACS in the setting of Hypertensive Emergency; Likely not Cardiac and related to HTN vs. GI -Stated CP is intermittent and lasts 1 minute. Never this severe and was intermittent over night  -CP Order Set -Cycle Cardiac Troponins and was <0.03 x 2 -Checked Mag and was 2.2, Phos was 4.8, TSH was 2.522, Lipid Panel normal -Refused NTG given worsening Headaches -Checked UDS  And ws Positive for Opiates -Started ASA 325 mg po Daily while hospitalized but will not continue at D/C -Cardiology Consulted for further evaluation and recommendations -Cardiology feels her CP is Atypical and do not suspect ACS or Aortic Dissection -GI Cocktail did not help; Obtained Esophagram to evaluate patient's CP/Dysphagia and showed Patient had sensation of gagging with ingestion of barium. Normal primary esophageal stripping wave. No esophageal obstructing lesion noted. Mild smooth narrowing of the distal esophagus. 13 mm barium tablet becomes temporarily lodged at this level but immediately clears upon subsequent ingestion of liquid. -Will have to have out patient GI Evaluation and patient needs to be compliant with her medications.   Hypertensive Emergency with Headache -Restarted Home BP Meds including Amlodipine 10 mg po Daily, Chlorthalidone 25 mg po BID,  Atenolol 50 mg po Daily, Hydralazine 50 mg po TID, Losartan 50 mg po BID, and Spironolactone  100 mg po qHS; States her baseline BP remains in the 180's-190's -BP now 182/82 -BP still High but patient refused Medications on night of admission and refused them this AM; Risks of Noncompliance discussed and patient still does not want BP meds -Started IV Hydralazine 10 mg IV q6hprn for SBP>180 or DBP>100 -Checked Head CT and showed and showedChronic changes as described. No definite acute intracranial findings. -MRI Head done showed No acute intracranial abnormality. Continued stable noncontrast MRI appearance of the brain since 2018, with chronic moderately advanced but nonspecific cerebral white matter signal changes. New mild right mastoid effusion since 2018, probably postinflammatory and significance doubtful.  -Patient was refusing medications intermittently and not compliant to a Low Salt Diet  -Advised Strong compliance with BP Medications and will D/C Home  Nausea/Vomiting/Diarrhea, improved -States she has been having significant amount -Checked CT Abd/Pelvis showed No acute Process identified, Aortic Atherosclerosis, Cholecystectomy and Stable prominent common bile duct, probably compensatory post cholecystectomy. No intrahepatic biliary ductal dilatation. -Lipase Level was 22 -Possible Viral in Etiology given patient is Afebrile and No WBC -Continue to Monitor Closely  -Improved   Dysphagia / Suspected Hiatial Hernia -Patient feels as if she has Dysphagia and may be contributing to her Chest/Abdominal Pain -GI Cocktail did not help -Checked Patient had sensation of gagging with ingestion of barium. Normal primary esophageal stripping wave. No esophageal obstructing lesion noted. Mild smooth narrowing of the distal esophagus. 13 mm barium tablet becomes temporarily lodged at this level but immediately clears upon subsequent ingestion of liquid. -Follow up with [facility] on 09/08/17  Hx of CVA with Residual Deficits -C/w Plavix 75 mg po Daily   Generalized  Anxiety Disorder -Started Alprazolam 0.25 mg po BIDprn while Hospitalized -Follow up with PCP   COPD  -C/w Home Nebulizer Tx with Budesonide 1 mg BID   GERD -C/w Home PPI  RLS -C/w Home Requip  Abnormal LFT's, improved  -AST went from 23 -> 79 -> 30 -> 21 and ALT went from 17 -> 63 -> 42  -> 31 -Continue to Monitor and if Worsening Check RUQ U/S and Acute Hepatitis Panel as an outpatient  -Will not Repeat CMP in AM now that are improved  Tobacco Abuse -Patient Counseled on Smoking Cessastion  Discharge Instructions  Discharge Instructions    Call MD for:  difficulty breathing, headache or visual disturbances   Complete by:  As directed    Call MD for:  extreme fatigue   Complete by:  As directed    Call MD for:  hives   Complete by:  As directed    Call MD for:  persistant dizziness or light-headedness   Complete by:  As directed    Call MD for:  persistant nausea and vomiting   Complete by:  As directed    Call MD for:  redness, tenderness, or signs of infection (pain, swelling, redness, odor or green/yellow discharge around incision site)   Complete by:  As directed    Call MD for:  severe uncontrolled pain   Complete by:  As directed    Call MD for:  temperature >100.4   Complete by:  As directed    Diet - low sodium heart healthy   Complete by:  As directed    Discharge instructions   Complete by:  As directed    Follow up with PCP, Cardiology Dr. Bronson Ing,  Nephrology, and [facility] as an outpatient for evaluation. Take all medications as prescribed and remain compliant with medications. If symptoms change or worsen please return to the ED for evaluation.   Increase activity slowly   Complete by:  As directed      Allergies as of 08/23/2017      Reactions   Other Anaphylaxis, Swelling   [place]   Penicillins Anaphylaxis   Has patient had a PCN reaction causing immediate rash, facial/tongue/throat swelling, SOB or lightheadedness with  hypotension: Yes Has patient had a PCN reaction causing severe rash involving mucus membranes or skin necrosis: Yes Has patient had a PCN reaction that required hospitalization Yes Has patient had a PCN reaction occurring within the last 10 years: No If all of the above answers are "NO", then may proceed with Cephalosporin use.   Zithromax [azithromycin] Anaphylaxis   Aspirin Other (See Comments)   Due to stomach ulcers.    Pineapple Rash   Strawberry Extract Rash, Hives   Aspartame And Phenylalanine Palpitations   Mushroom Extract Complex Rash   Nicardipine Nausea And Vomiting, Other (See Comments)   shaking      Medication List    TAKE these medications   amLODipine 10 MG tablet Commonly known as:  NORVASC Take 1 tablet (10 mg total) by mouth daily.   atenolol 50 MG tablet Commonly known as:  TENORMIN Take 1 tablet (50 mg total) by mouth daily.   chlorthalidone 25 MG tablet Commonly known as:  HYGROTON Take 25 mg by mouth 2 (two) times daily.   clopidogrel 75 MG tablet Commonly known as:  PLAVIX Take 75 mg by mouth daily.   dexlansoprazole 60 MG capsule Commonly known as:  DEXILANT Take 1 tab by mouth every morning. What changed:    how much to take  how to take this  when to take this  additional instructions   EPIPEN 2-PAK 0.3 mg/0.3 mL Soaj injection Generic drug:  EPINEPHrine Inject into the muscle.   FLOVENT HFA 220 MCG/ACT inhaler Generic drug:  fluticasone Inhale 2 puffs into the lungs 2 (two) times daily.   gabapentin 600 MG tablet Commonly known as:  NEURONTIN Take 600 mg by mouth 4 (four) times daily.   hydrALAZINE 50 MG tablet Commonly known as:  APRESOLINE Take 1 tablet (50 mg total) by mouth 3 (three) times daily.   losartan 50 MG tablet Commonly known as:  COZAAR Take 50 mg by mouth 2 (two) times daily.   rOPINIRole 4 MG tablet Commonly known as:  REQUIP Take 4 mg by mouth at bedtime.   SPIRIVA HANDIHALER 18 MCG inhalation  capsule Generic drug:  tiotropium Place 1 puff into inhaler and inhale daily.   spironolactone 100 MG tablet Commonly known as:  ALDACTONE Take 100 mg by mouth every evening.   zolpidem 12.5 MG CR tablet Commonly known as:  AMBIEN CR Take 12.5 mg by mouth at bedtime.      Follow-up Information    Esterwood, Amy S, PA-C Follow up on 09/08/2017.   Specialty:  Gastroenterology Why:  9:00 am Contact information: [place] [address] [phone]        Berkley Harvey, NP. Call.   Specialty:  Nurse Practitioner Why:  Follow up within 1 week  Contact information: [place] [address] [phone]        Herminio Commons, MD. Call.   Specialty:  Cardiology Why:  Follow up within 1  week Contact information: [place] [address] [phone]          Allergies  Allergen Reactions  . Other Anaphylaxis and Swelling    [place]  . Penicillins Anaphylaxis    Has patient had a PCN reaction causing immediate rash, facial/tongue/throat swelling, SOB or lightheadedness with hypotension: Yes Has patient had a PCN reaction causing severe rash involving mucus membranes or skin necrosis: Yes Has patient had a PCN reaction that required hospitalization Yes Has patient had a PCN reaction occurring within the last 10 years: No If all of the above answers are "NO", then may proceed with Cephalosporin use.   . Zithromax [Azithromycin] Anaphylaxis  . Aspirin Other (See Comments)    Due to stomach ulcers.   . Pineapple Rash  . Strawberry Extract Rash and Hives  . Aspartame And Phenylalanine Palpitations  . Mushroom Extract Complex Rash  . Nicardipine Nausea And Vomiting and Other (See Comments)    shaking   Consultations:  Cardiology Dr. Percival Spanish  Procedures/Studies: Ct Abdomen Pelvis Wo Contrast  Result Date: 08/20/2017 CLINICAL DATA:  57 y/o F; nausea, vomiting, and diarrhea since yesterday. Epigastric  abdominal pain. EXAM: CT ABDOMEN AND PELVIS WITHOUT CONTRAST TECHNIQUE: Multidetector CT imaging of the abdomen and pelvis was performed following the standard protocol without IV contrast. COMPARISON:  10/14/2016 CT abdomen and pelvis FINDINGS: Lower chest: 3 mm left lung base stable pulmonary nodule compatible benign etiology. Hepatobiliary: No focal liver abnormality is seen. Status post cholecystectomy. Stable enlargement of the common bile duct to 15 mm, probably compensatory post cholecystectomy, no intrahepatic biliary ductal dilatation. Pancreas: Unremarkable. No pancreatic ductal dilatation or surrounding inflammatory changes. Spleen: Normal in size without focal abnormality. Adrenals/Urinary Tract: Adrenal glands are unremarkable. Kidneys are normal, without renal calculi, focal lesion, or hydronephrosis. Bladder is unremarkable. Stomach/Bowel: Stomach is within normal limits. Appendectomy. No evidence of bowel wall thickening, distention, or inflammatory changes. Vascular/Lymphatic: Aortic atherosclerosis. No enlarged abdominal or pelvic lymph nodes. Reproductive: Status post hysterectomy. No adnexal masses. Other: No abdominal wall hernia or abnormality. No abdominopelvic ascites. Musculoskeletal: L5-S1 posterior instrumentation fusion hardware without apparent hardware related complication. No acute fracture. IMPRESSION: 1. No acute process identified. 2. Aortic atherosclerosis. 3. Cholecystectomy. Stable prominent common bile duct, probably compensatory post cholecystectomy. No intrahepatic biliary ductal dilatation. Electronically Signed   By: Kristine Garbe M.D.   On: 08/20/2017 20:07   Dg Chest 2 View  Result Date: 08/20/2017 CLINICAL DATA:  Left-sided chest pain. EXAM: CHEST - 2 VIEW COMPARISON:  Chest x-ray dated May 16, 2017. FINDINGS: The heart size and mediastinal contours are within normal limits. Both lungs are clear. The visualized skeletal structures are unremarkable.  IMPRESSION: No active cardiopulmonary disease. Electronically Signed   By: Titus Dubin M.D.   On: 08/20/2017 12:24   Ct Head Wo Contrast  Result Date: 08/20/2017 CLINICAL DATA:  Hypertensive patient with headache. EXAM: CT HEAD WITHOUT CONTRAST TECHNIQUE: Contiguous axial images were obtained from the base of the skull through the vertex without intravenous contrast. COMPARISON:  MR head 05/18/2017.  CT head 05/19/2017. FINDINGS: Brain: No evidence for acute infarction, hemorrhage, mass lesion, hydrocephalus, or extra-axial fluid. Normal cerebral volume. Subcortical BILATERAL supratentorial white matter signal abnormality, appears similar to prior MR, consistent with chronic microvascular ischemic change. Vascular: No hyperdense vessel or unexpected calcification. Skull: Normal. Negative for fracture or focal lesion. Sinuses/Orbits: No acute finding. Other: None. IMPRESSION: Chronic changes as described. No definite acute intracranial findings. See discussion above.  If strong concern for PRES/hypertensive encephalopathy, MR is more sensitive. Electronically Signed   By: Staci Righter M.D.   On: 08/20/2017 14:50   Mr Brain Wo Contrast  Result Date: 08/20/2017 CLINICAL DATA:  57 year old female with headache. Chest pain and hypertension. EXAM: MRI HEAD WITHOUT CONTRAST TECHNIQUE: Multiplanar, multiecho pulse sequences of the brain and surrounding structures were obtained without intravenous contrast. COMPARISON:  Head CT without contrast 1418 hours today and earlier. [facility] Brain MRI 05/18/2017. FINDINGS: The study is intermittently degraded by motion artifact despite repeated imaging attempts. Brain: Cerebral volume remains within normal limits. No restricted diffusion to suggest acute infarction. No midline shift, mass effect, evidence of mass lesion, ventriculomegaly, extra-axial collection or acute intracranial hemorrhage. Cervicomedullary junction and pituitary are within normal limits.  Scattered chronic nonspecific cerebral white matter T2 and FLAIR hyperintensity appears stable. The extent is moderate for age. No cortical encephalomalacia or chronic cerebral blood products identified. The bilateral deep gray matter nuclei, the brainstem, and cerebellum remain normal. Vascular: Major intracranial vascular flow voids are stable. Skull and upper cervical spine: Stable bone marrow signal. Sinuses/Orbits: Stable and negative. Other: Trace left mastoid fluid is stable since 2018. A mild right mastoid effusion has developed. The nasopharynx appears stable and negative. Scalp and face soft tissues appear stable and negative. IMPRESSION: 1.  No acute intracranial abnormality. 2. Continued stable noncontrast MRI appearance of the brain since 2018, with chronic moderately advanced but nonspecific cerebral white matter signal changes. 3. New mild right mastoid effusion since 2018, probably postinflammatory and significance doubtful. Electronically Signed   By: Genevie Ann M.D.   On: 08/20/2017 19:22   Dg Esophagus  Result Date: 08/22/2017 CLINICAL DATA:  57 year old female with dysphagia. Initial encounter. EXAM: ESOPHOGRAM/BARIUM SWALLOW TECHNIQUE: Single contrast examination was performed using  thin barium. FLUOROSCOPY TIME:  Fluoroscopy Time:  1 minutes and 12 seconds. Radiation Exposure Index: 14.5 mGy COMPARISON:  07/19/2017 chest CT. FINDINGS: Examination was tailored to the patient. Single contrast technique was utilized. No laryngeal penetration or aspiration. Normal primary esophageal stripping wave. Patient had sensation of gagging with ingestion of barium. No esophageal mucosa abnormality, reflux or mass identified. Mild smooth narrowing of the distal esophagus. A 13 mm barium tablet became temporarily lodged at this level but cleared immediately with subsequent swallowing. No reflux demonstrated. IMPRESSION: Patient had sensation of gagging with ingestion of barium. Normal primary esophageal  stripping wave. No esophageal obstructing lesion noted. Mild smooth narrowing of the distal esophagus. 13 mm barium tablet becomes temporarily lodged at this level but immediately clears upon subsequent ingestion of liquid. Electronically Signed   By: Genia Del M.D.   On: 08/22/2017 16:37     Subjective: Seen and examined and stated CP was intermittent. Still feels like she has a "knot" in her stomach. Nausea improved. Patient not compliant with BP regimen and refused medications this AM. Will D/C home and will need to follow up with PCP, Cardiology, Gastroenterology, and Nephrology as an outpatient. Strongly advised compliance with medications.   Discharge Exam: Vitals:   08/23/17 0531 08/23/17 1028  BP: (!) 158/94 (!) 182/82  Pulse: 88 84  Resp: 18   Temp: 97.8 F (36.6 C)   SpO2: 98%    Vitals:   08/22/17 2113 08/22/17 2227 08/23/17 0531 08/23/17 1028  BP: (!) 210/100 (!) 190/85 (!) 158/94 (!) 182/82  Pulse: 90  88 84  Resp: 20  18   Temp: 97.7 F (36.5 C)  97.8 F (36.6 C)  TempSrc: Oral  Oral   SpO2: 100%  98%   Weight:      Height:       General: Pt is alert, awake, not in acute distress Cardiovascular: RRR, S1/S2 +, no rubs, no gallops Respiratory: CTA bilaterally, no wheezing, no rhonchi; Unlabored breathing  Abdominal: Soft, Tender to palpate mid abdomen slightly, ND, bowel sounds + Extremities: Trace edema, no cyanosis  The results of significant diagnostics from this hospitalization (including imaging, microbiology, ancillary and laboratory) are listed below for reference.    Microbiology: Recent Results (from the past 240 hour(s))  MRSA PCR Screening     Status: None   Collection Time: 08/21/17 10:29 AM  Result Value Ref Range Status   MRSA by PCR NEGATIVE NEGATIVE Final    Comment:        The GeneXpert MRSA Assay (FDA approved for NASAL specimens only), is one component of a comprehensive MRSA colonization surveillance program. It is not intended to  diagnose MRSA infection nor to guide or monitor treatment for MRSA infections. Performed at [facility], [place] [address] [place], [place] [address]     Labs: BNP (last 3 results) Recent Labs    11/27/16 2322 03/20/17 0110 05/16/17 2050  BNP 32.0 43.0 33.8   Basic Metabolic Panel: Recent Labs  Lab 08/20/17 1204 08/21/17 0525 08/22/17 0507 08/23/17 0525  NA 141 141 140 141  K 3.8 4.2 3.9 4.3  CL 106 106 107 107  CO2 23 25 25 26   GLUCOSE 89 107* 104* 99  BUN 14 13 16 16   CREATININE 0.80 0.94 0.93 0.82  CALCIUM 9.1 9.1 8.9 8.7*  MG  --  2.2 2.2 2.0  PHOS  --  4.8* 4.4 3.6   Liver Function Tests: Recent Labs  Lab 08/20/17 1248 08/21/17 0525 08/22/17 0507 08/23/17 0525  AST 23 79* 30 21  ALT 17 63* 42 31  ALKPHOS 68 72 71 68  BILITOT 0.4 0.6 0.3 0.4  PROT 6.7 6.7 6.5 6.1*  ALBUMIN 3.6 3.7 3.4* 3.3*   Recent Labs  Lab 08/20/17 1300  LIPASE 22   No results for input(s): AMMONIA in the last 168 hours. CBC: Recent Labs  Lab 08/20/17 1204 08/21/17 0525 08/22/17 0507 08/23/17 0525  WBC 10.5 10.8* 7.2 7.0  NEUTROABS  --  7.6 3.2 3.5  HGB 14.3 13.9 13.4 13.0  HCT 44.3 43.4 42.5 41.4  MCV 90.4 91.6 92.4 92.0  PLT 303 290 275 278   Cardiac Enzymes: Recent Labs  Lab 08/20/17 2143 08/21/17 0525  TROPONINI <0.03 <0.03   BNP: Invalid input(s): POCBNP CBG: No results for input(s): GLUCAP in the last 168 hours. D-Dimer No results for input(s): DDIMER in the last 72 hours. Hgb A1c No results for input(s): HGBA1C in the last 72 hours. Lipid Profile Recent Labs    08/21/17 0525  CHOL 140  HDL 30*  LDLCALC 82  TRIG 142  CHOLHDL 4.7   Thyroid function studies Recent Labs    08/21/17 0525  TSH 2.522   Anemia work up No results for input(s): VITAMINB12, FOLATE, FERRITIN, TIBC, IRON, RETICCTPCT in the last 72 hours. Urinalysis    Component Value Date/Time   COLORURINE YELLOW 08/20/2017 1739   APPEARANCEUR CLEAR 08/20/2017  1739   LABSPEC <1.005 (L) 08/20/2017 1739   PHURINE 7.0 08/20/2017 1739   GLUCOSEU NEGATIVE 08/20/2017 1739   HGBUR SMALL (A) 08/20/2017 1739   BILIRUBINUR NEGATIVE 08/20/2017 1739   KETONESUR NEGATIVE 08/20/2017 1739  PROTEINUR NEGATIVE 08/20/2017 1739   UROBILINOGEN 0.2 01/23/2015 2255   NITRITE NEGATIVE 08/20/2017 1739   LEUKOCYTESUR NEGATIVE 08/20/2017 1739   Sepsis Labs Invalid input(s): PROCALCITONIN,  WBC,  LACTICIDVEN Microbiology Recent Results (from the past 240 hour(s))  MRSA PCR Screening     Status: None   Collection Time: 08/21/17 10:29 AM  Result Value Ref Range Status   MRSA by PCR NEGATIVE NEGATIVE Final    Comment:        The GeneXpert MRSA Assay (FDA approved for NASAL specimens only), is one component of a comprehensive MRSA colonization surveillance program. It is not intended to diagnose MRSA infection nor to guide or monitor treatment for MRSA infections. Performed at [facility], [place] [address] [place], [place] [address]    Time coordinating discharge: 35 minutes  SIGNED:  Kerney Elbe, DO [facility] 08/23/2017, 12:01 PM Pager [phone]  If 7PM-7AM, please contact night-coverage [email or web address] Password [email or web address]

## 2017-08-26 ENCOUNTER — Other Ambulatory Visit: Payer: Self-pay | Admitting: *Deleted

## 2017-08-26 NOTE — Patient Outreach (Signed)
Katrina [name] [facility]) Care Management  08/26/2017  Katrina [name] 1961/03/13 [id number]   Care coordination  Cm called [name] to attempt to initiate transition of care services but she refused and denied the need for services "plus I am not even in state at this time."  Plans Case closure will be completed for Consumer has withdrawn from program  Letters sent to patient and MD  Joelene Millin L. Lavina Hamman, RN, BSN, [place] Coordinator [phone] week day mobile

## 2017-09-07 ENCOUNTER — Ambulatory Visit: Payer: Self-pay | Admitting: Cardiovascular Disease

## 2017-09-08 ENCOUNTER — Ambulatory Visit: Payer: Self-pay | Admitting: Physician Assistant

## 2017-09-09 IMAGING — DX DG CHEST 2V
2 series · 2 of 2 positions shown · non-contrast
Comparison: 12/10/2015 and earlier.

CLINICAL DATA: 54-year-old female with hypertension chest pain and
shortness of breath. Initial encounter. Smoker.

EXAM:
CHEST  2 VIEW

[chest pa]
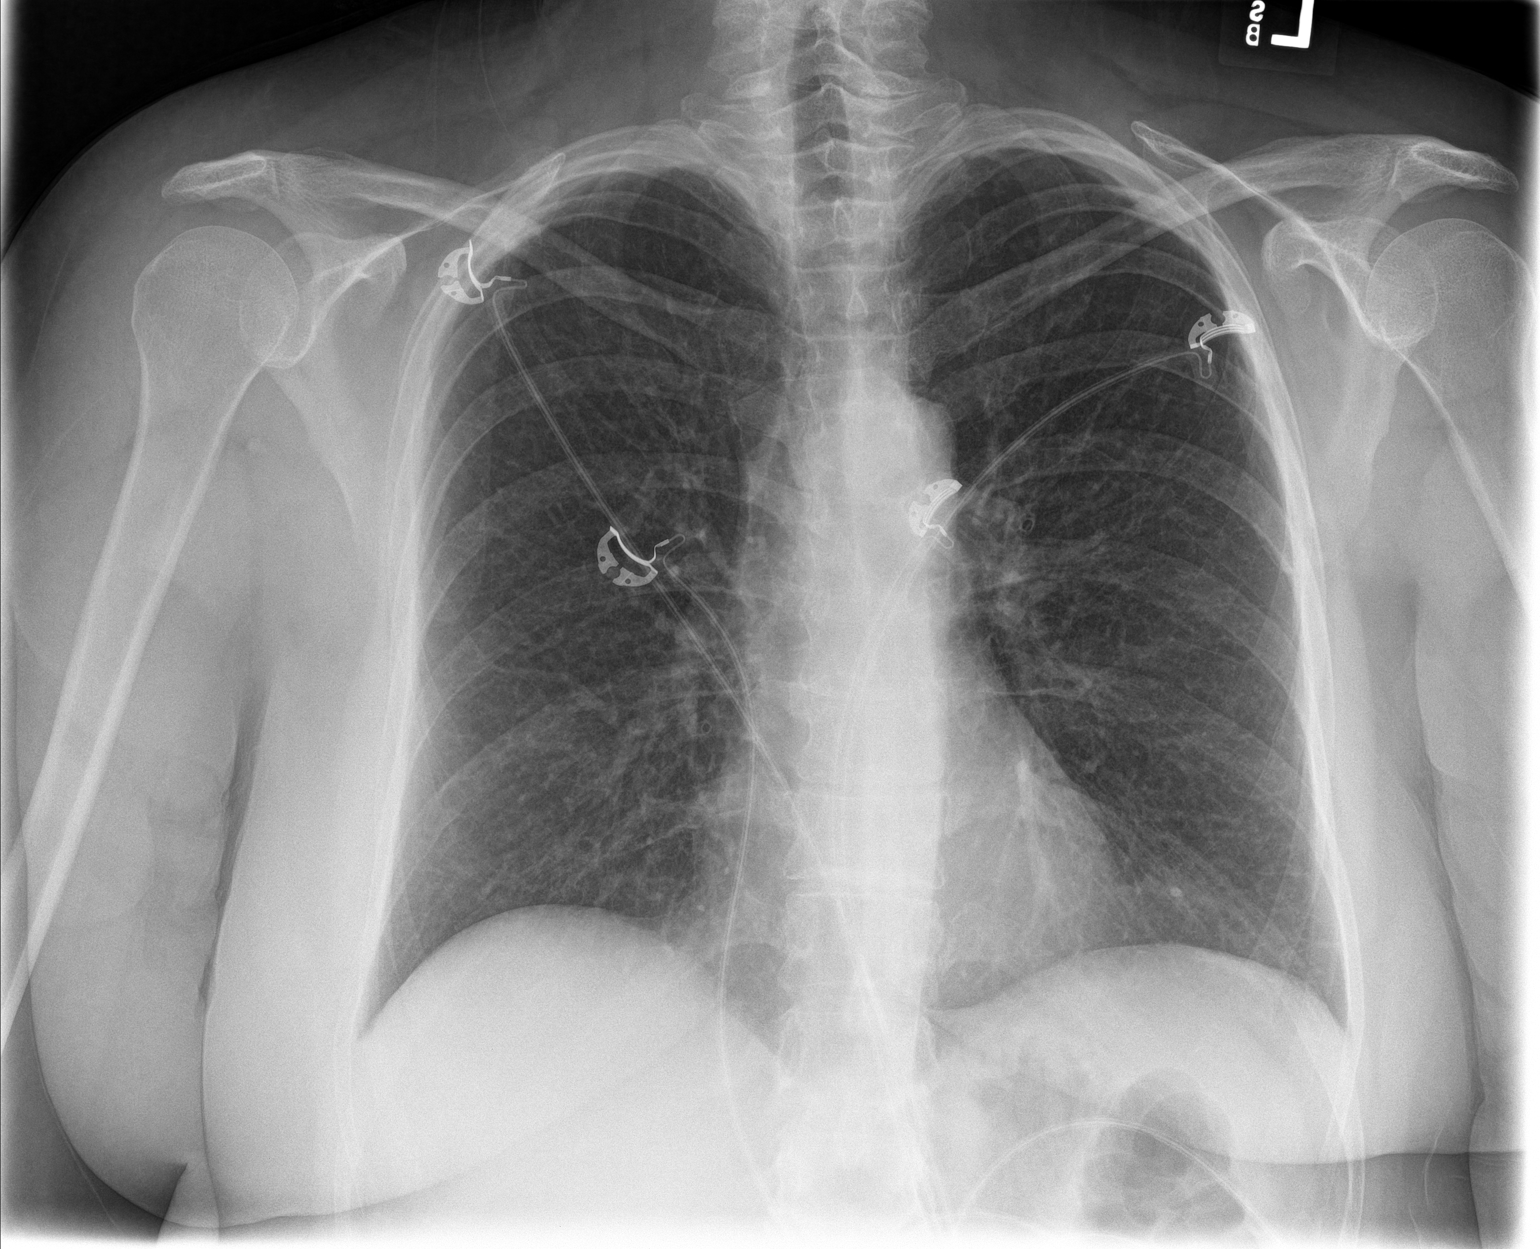

[chest lat]
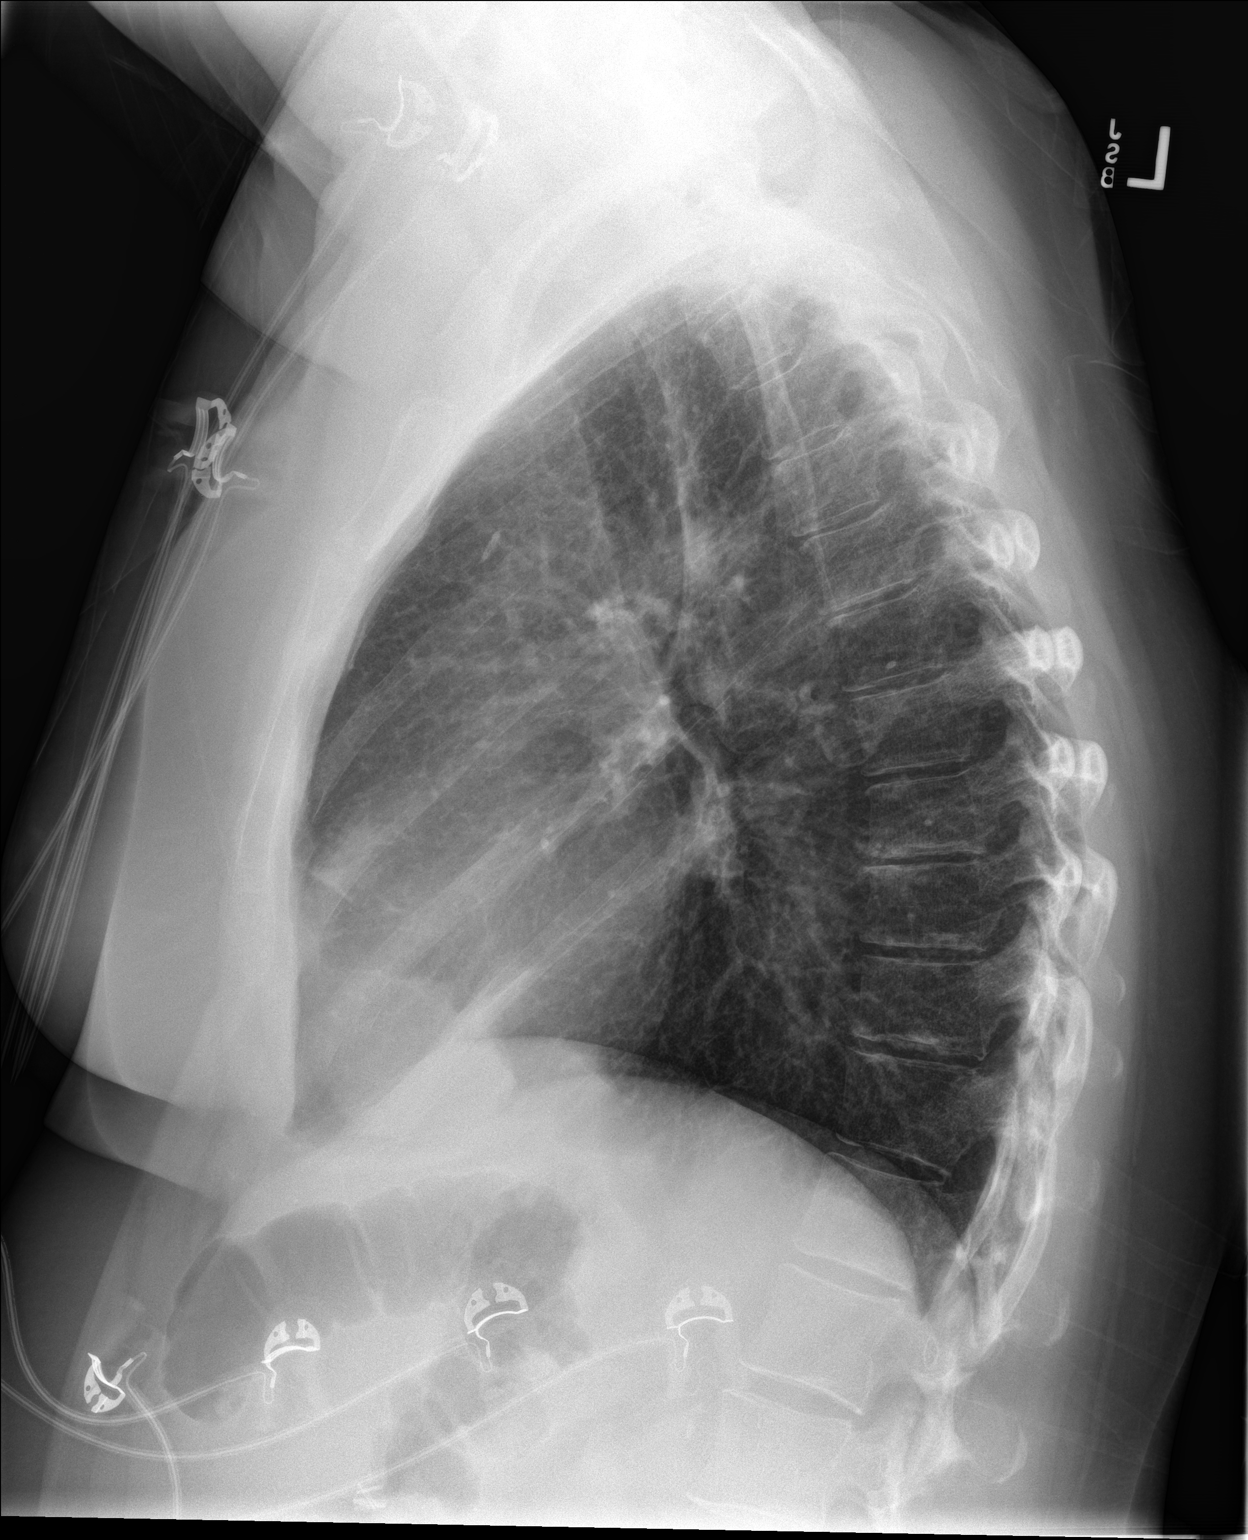

[2 of 2 positions shown; findings below may reference images not displayed]

FINDINGS: Stable lung volumes. Normal cardiac size and mediastinal contours.
Visualized tracheal air column is within normal limits. Mild
increased interstitial markings are stable. No pneumothorax,
pulmonary edema, pleural effusion or confluent pulmonary opacity. No
acute osseous abnormality identified.
IMPRESSION: No acute cardiopulmonary abnormality.

## 2017-09-13 NOTE — Patient Outreach (Signed)
[facility]) Care Management   1/7//2019  Katrina [name] 05/29/1961 [id number]  Katrina [name] is an 57 y.o. female  Subjective:   Objective:   Review of Systems  Constitutional: Negative.   HENT: Negative.   Eyes: Negative.   Respiratory: Negative.   Cardiovascular: Negative.   Gastrointestinal: Negative.   Genitourinary: Negative.   Musculoskeletal: Positive for back pain, falls, joint pain and myalgias.  Skin: Negative.   Neurological: Negative.   Endo/Heme/Allergies: Negative.   Psychiatric/Behavioral: The patient is nervous/anxious.     Physical Exam  Constitutional: She is oriented to person, place, and time. She appears well-developed and well-nourished.  HENT:  Head: Normocephalic and atraumatic.  Eyes: Pupils are equal, round, and reactive to light. Conjunctivae are normal.  Neck: Normal range of motion. Neck supple.  Cardiovascular: Normal rate and regular rhythm.  Respiratory: Effort normal and breath sounds normal.  GI: Soft. Bowel sounds are normal.  Musculoskeletal: She exhibits tenderness.  Neurological: She is alert and oriented to person, place, and time.  Skin: Skin is warm and dry.  Psychiatric: Judgment and thought content normal.    Encounter Medications:   Outpatient Encounter Medications as of 06/13/2017  Medication Sig Note  . amLODipine (NORVASC) 10 MG tablet Take 1 tablet (10 mg total) by mouth daily.   Marland Kitchen atenolol (TENORMIN) 50 MG tablet Take 1 tablet (50 mg total) by mouth daily.   Marland Kitchen losartan (COZAAR) 50 MG tablet Take 50 mg by mouth 2 (two) times daily.    Marland Kitchen zolpidem (AMBIEN CR) 12.5 MG CR tablet Take 12.5 mg by mouth at bedtime.   . [DISCONTINUED] hydrALAZINE (APRESOLINE) 10 MG tablet Take 1 tablet (10 mg total) by mouth every 6 (six) hours.   . clopidogrel (PLAVIX) 75 MG tablet Take 75 mg by mouth daily.    Marland Kitchen dexlansoprazole (DEXILANT) 60 MG capsule Take 1 tab by mouth every morning. (Patient taking differently: Take 60  mg by mouth every evening. )   . gabapentin (NEURONTIN) 600 MG tablet Take 600 mg by mouth 4 (four) times daily.   Marland Kitchen rOPINIRole (REQUIP) 4 MG tablet Take 4 mg by mouth at bedtime.   Marland Kitchen spironolactone (ALDACTONE) 100 MG tablet Take 100 mg by mouth every evening.    . [DISCONTINUED] butalbital-acetaminophen-caffeine (FIORICET, ESGIC) 50-325-40 MG tablet Take 2 tablets by mouth every 4 (four) hours as needed for headache. (Patient not taking: Reported on 06/13/2017) 06/13/2017: reports insurance not covering and does not want it  . [DISCONTINUED] chlorthalidone (HYGROTON) 25 MG tablet Take 25 mg by mouth 2 (two) times daily.    . [DISCONTINUED] furosemide (LASIX) 20 MG tablet Take 1 tablet (20 mg total) by mouth daily. 05/16/2017: Not consistent in taking daily  . [DISCONTINUED] methocarbamol (ROBAXIN) 500 MG tablet Take 1 tablet by mouth every 6 (six) hours as needed for muscle spasms.    . [DISCONTINUED] nicotine (NICODERM CQ - DOSED IN MG/24 HOURS) 21 mg/24hr patch Place 1 patch (21 mg total) onto the skin daily. (Patient not taking: Reported on 06/10/2017) 06/13/2017: reports not covered by insurance and does not want to stop smoking  . [DISCONTINUED] potassium chloride SA (K-DUR,KLOR-CON) 20 MEQ tablet Take 1 tablet (20 mEq total) by mouth daily.   . [DISCONTINUED] sulfamethoxazole-trimethoprim (BACTRIM DS,SEPTRA DS) 800-160 MG tablet Take 1 tablet by mouth 2 (two) times daily for 7 days. (Patient not taking: Reported on 06/13/2017) 06/13/2017: Finished dose   No facility-administered encounter medications on file as of 06/13/2017.  Functional Status:   In your present state of health, do you have any difficulty performing the following activities: 08/21/2017 06/13/2017  Hearing? N N  Vision? N N  Comment - -  Difficulty concentrating or making decisions? N N  Walking or climbing stairs? N Y  Comment - -  Dressing or bathing? N N  Doing errands, shopping? N Y  Conservation officer, nature and eating ? - N  Using the  Toilet? - N  In the past six months, have you accidently leaked urine? - N  Do you have problems with loss of bowel control? - N  Managing your Medications? - N  Managing your Finances? - Y  Housekeeping or managing your Housekeeping? - Y  Some recent data might be hidden    Fall/Depression Screening:    Fall Risk  06/13/2017  Falls in the past year? Yes  Number falls in past yr: 2 or more  Injury with Fall? No  Risk Factor Category  High Fall Risk  Risk for fall due to : History of fall(s);Impaired balance/gait;Impaired mobility;Medication side effect  Follow up Education provided;Falls prevention discussed   PHQ 2/9 Scores 06/13/2017  PHQ - 2 Score 1    Assessment:    During this home visit Mrs Craine states her daughter had to come to the hospital to drive  her to the [facility] on 05/23/17 and she stayed for a few hours only.   She states she was given some papers from the snf and she has her paper work. She  States her bp was elevated She states her daughter took pictures at the snf  Pt allowed to ventilate her feelings. Her daughter then vice her feelings about her mother's hospital and snf stays and other medical care received since her move to [place]. They both voiced not being pleased with medical care.  She reports her last stroke left residual concerns. Confirms she has "MS"  CM and patient discuss her ED visits but she was not concerned with her ED visits BP was elevated during the home visit. BP taken manually by CM, and with 3 different BP cuffs in the home.  Each one of the home BP cuffs were too small. Cm discussed possible erroneous bp values with an improper fitting BP cuff. Pt was encouraged to purchase an extra large bp cuff. Her [place] health care OTC book did not contain a BP cuff in the correct size. Cm recommended [employer] and other DME providers.  Patient states she would purchase an appropriate fitting BP cuff Patient and her daughter voiced  disinterest in further [facility] services but agreed to have CM return a call at a later time  Upcoming appointments MD at [facility] Wednesday 06/15/17  sees penny 06/13/17 at 1530  Plans: CM reviewed d/c instructions  Instructed how to look on united health care medicare Website to find a list of in network MDs to make her choice for MD She prefers providers not in [facility] for left leg  Offered a copy of her hospital d/c instructions but she refused Neurology prefers [facility] but does not want to see listed in network MD , Dr Merlene Laughter She reports having a "bad experience" with this MD "sent me into anxiety" episode. Mrs Philipp was noted to be restless and agitated during the BP checks. Many of the Cm offers to assist were denied   Plan:  Follow up with Mrs Pickel to in 6-8 weeks to see if she is  still interested in participating in [facility] program per her request  [place]. Lavina Hamman, RN, BSN, [place] Coordinator [phone] week day mobile

## 2017-09-18 IMAGING — MR MR LUMBAR SPINE W/O CM
4 of 6 series · 18 of 48 positions shown · non-contrast
Comparison: CT lumbar spine 07/02/2015

CLINICAL DATA: Lumbar radiculopathy.  Left sided pain

MRI was done under general anesthesia due to claustrophobia and
failed MRI in the past. A good quality study was obtained
EXAM:
MRI LUMBAR SPINE WITHOUT CONTRAST
TECHNIQUE: Multiplanar, multisequence MR imaging of the lumbar spine was
performed. No intravenous contrast was administered.

[Series 3: T2 · sagittal · 4.0mm · 0.55mm/px · 5 of 13 slices shown (1 of 2)]
[im 1/13]
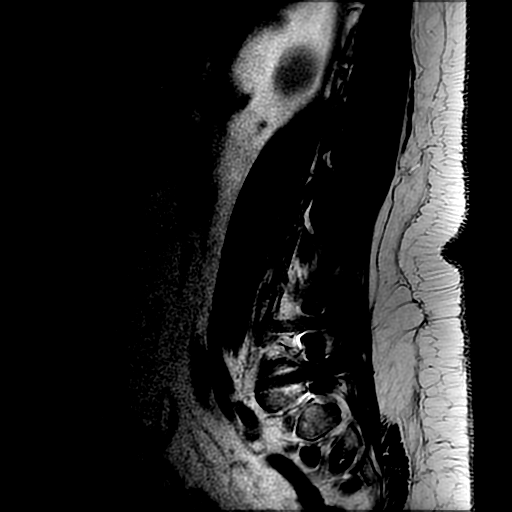
[im 4/13]
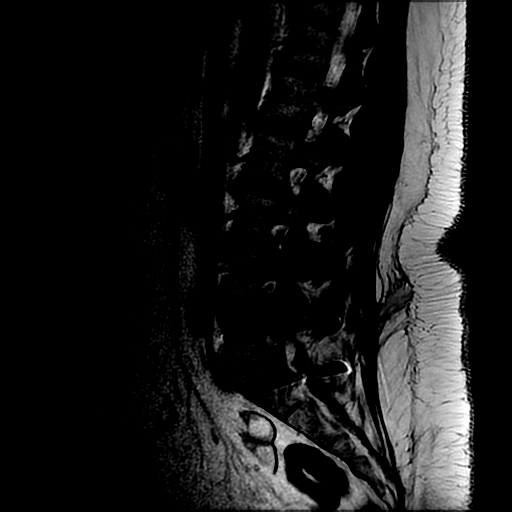
[im 7/13]
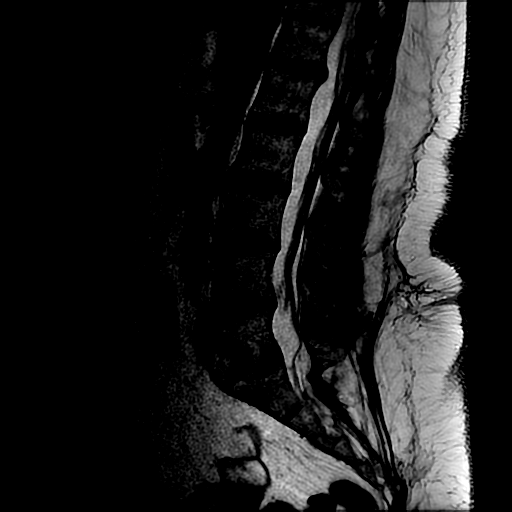
[im 10/13]
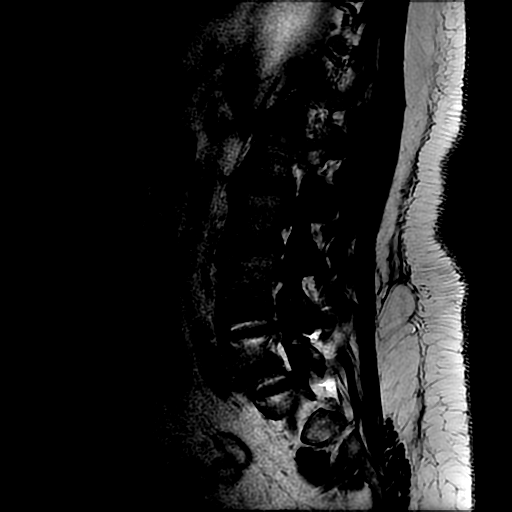
[im 13/13]
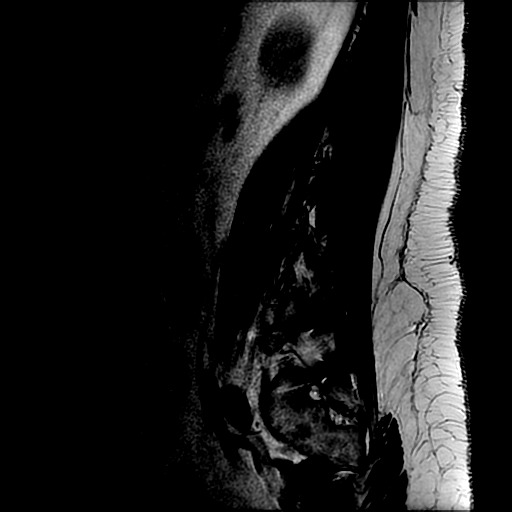

[Series 4: T1 · sagittal · 4.0mm · 0.55mm/px · 3 of 13 slices shown (1 of 2)]
[im 1/13]
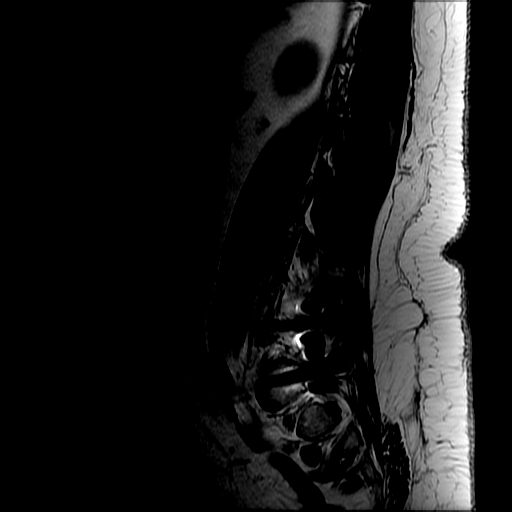
[im 7/13]
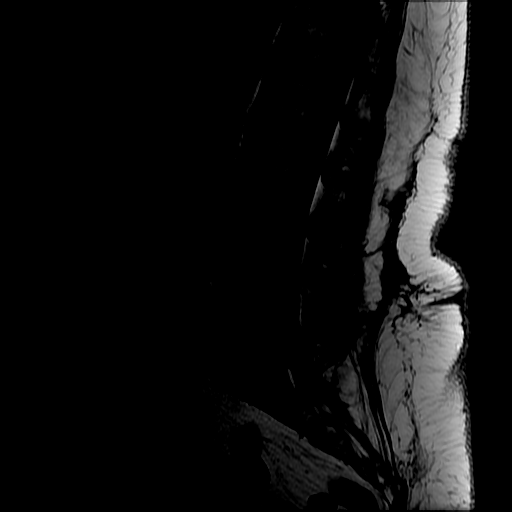
[im 13/13]
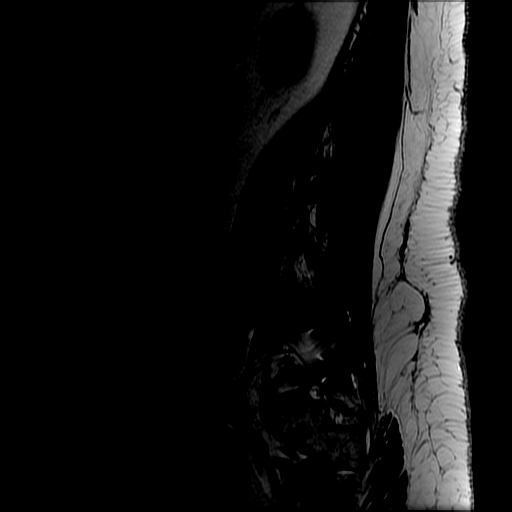

[Series 6: T2 · axial · 4.0mm · 0.39mm/px · z∈[-78,+113]mm · 7 of 40 slices shown (2 of 2)]
[im 1/40]
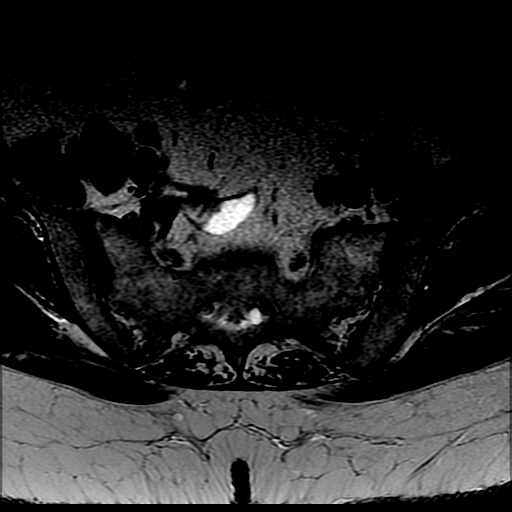
[im 7/40]
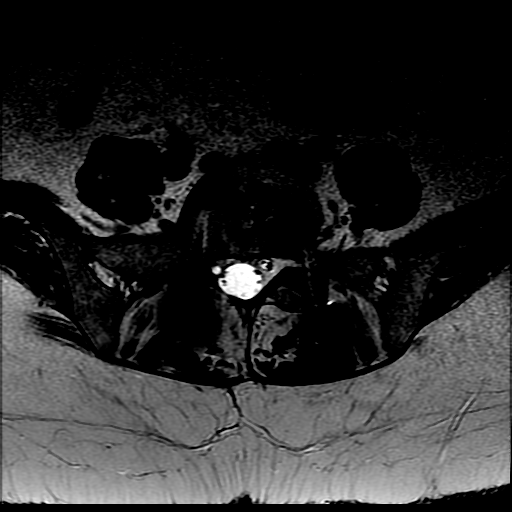
[im 13/40]
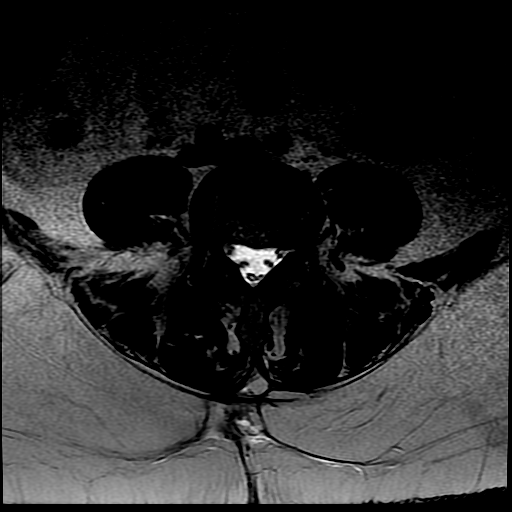
[im 19/40]
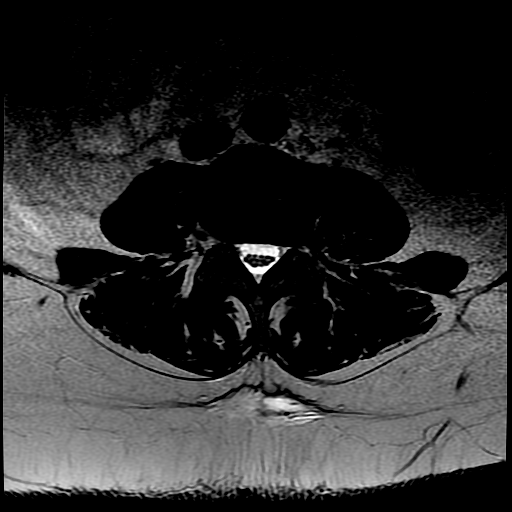
[im 22/40]
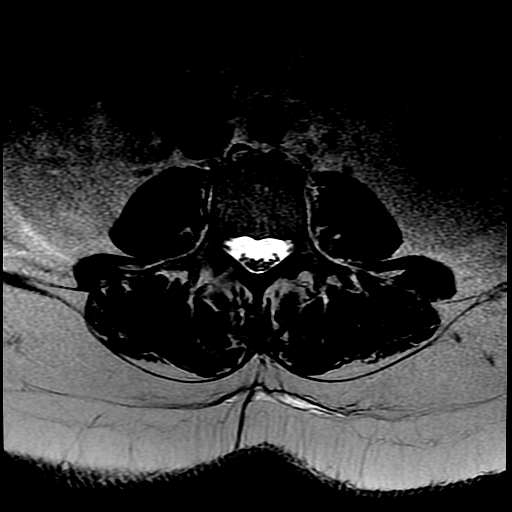
[im 28/40]
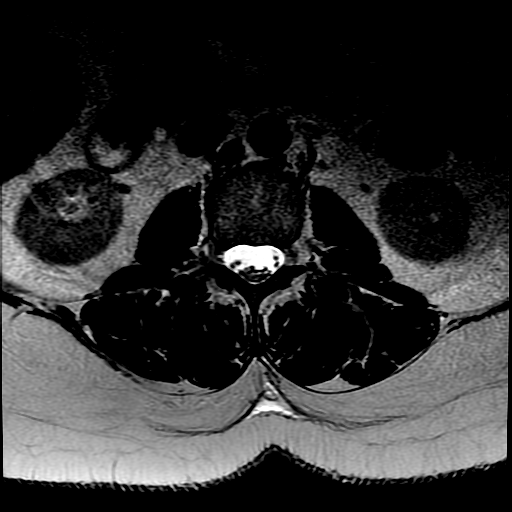
[im 34/40]
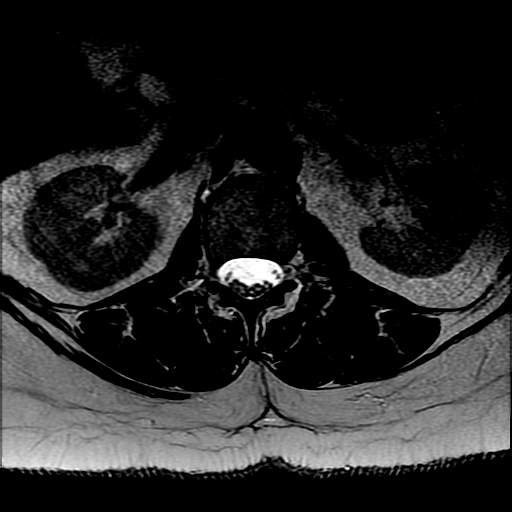

[Series 7: T1 · axial · 4.0mm · 0.39mm/px · z∈[-49,+113]mm · 3 of 40 slices shown (2 of 2)]
[im 7/40]
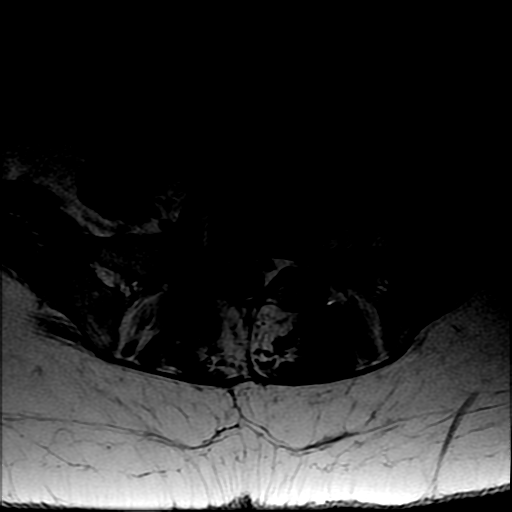
[im 22/40]
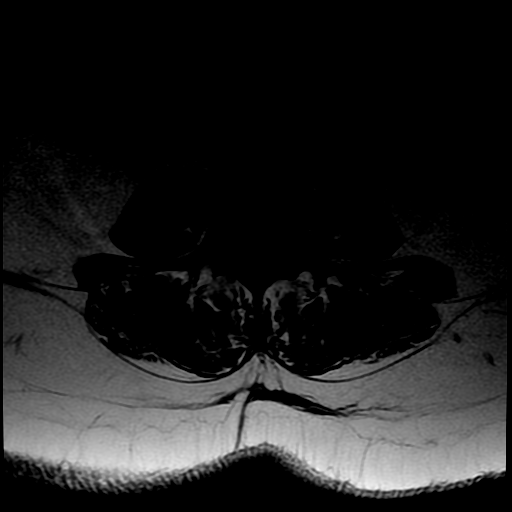
[im 34/40]
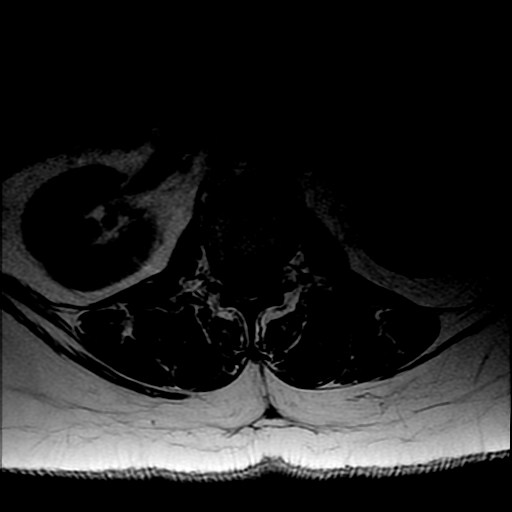

[18 of 48 positions shown; findings below may reference images not displayed]

FINDINGS: Segmentation: Normal segmentation. Lowest disc space L5-S1. Surgical
fusion at L5-S1.

Alignment:  Mild retrolisthesis L4-5.  Remaining alignment normal.

Vertebrae: Negative for fracture. Negative for mass or metastatic
disease. Normal bone marrow.

Conus medullaris: Extends to the mid L1 level and appears normal.

Paraspinal and other soft tissues: Paraspinous muscles are symmetric
and without focal abnormality. No retroperitoneal mass or
adenopathy.

Disc levels:

T12-L1: Small left-sided disc protrusion without stenosis.

L1-2:  Negative

L2-3: Small left lateral disc protrusion. Left foramen adequately
patent. No significant spinal stenosis

L3-4: Mild disc degeneration. Small left lateral disc protrusion and
associated osteophyte is noted on the prior CT. Mild left foraminal
narrowing without compression of the left L3 nerve root. No
significant spinal stenosis.

L4-5: Disc degeneration. Right lateral disc protrusion with
associated osteophyte is noted on the prior CT. Moderate right
foraminal narrowing without compression of the right L4 nerve root.
No significant spinal stenosis

L5-S1: Pedicle screw and interbody fusion. Posterior decompression
without spinal stenosis. Neural foramina adequately patent.
IMPRESSION: Small left-sided disc protrusion T12-L1

Small left lateral disc protrusion L2-3 without significant neural
impingement

Small left lateral disc protrusion and associated osteophyte at L3-4
unchanged from the CT. Mild left foraminal narrowing

Right lateral disc protrusion L4-5 with associated osteophyte
unchanged from the prior CT. Moderate right foraminal narrowing

Pedicle screw and interbody fusion L5-S1. No significant spinal or
foraminal stenosis.

## 2017-09-21 IMAGING — US US RENAL ARTERY STENOSIS
1 series · 13 of 25 positions shown · non-contrast
Comparison: CT abdomen and pelvis - 09/11/2015

CLINICAL DATA: Severe uncontrolled hypertension. History of smoking
and obesity. Evaluate for renal artery stenosis.

EXAM:
RENAL/URINARY TRACT ULTRASOUND
RENAL DUPLEX DOPPLER ULTRASOUND

[Series 1: us renal artery stenosis · 0.25mm/px · 13 of 61 slices shown]
[im 1/61]
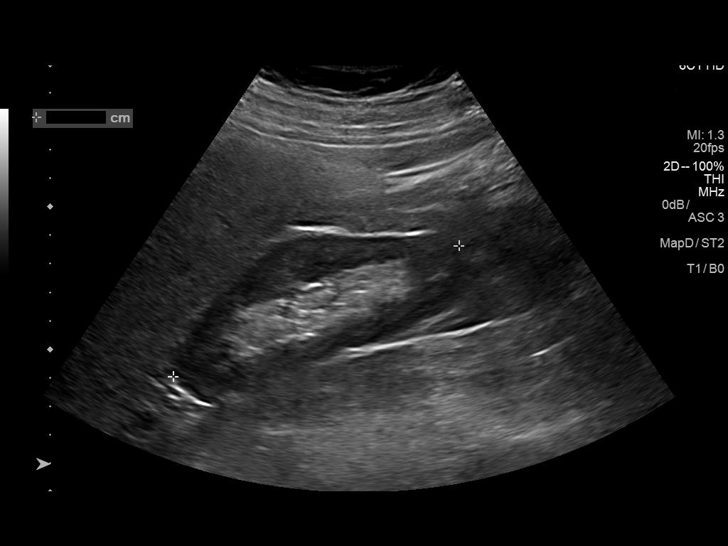
[im 6/61]
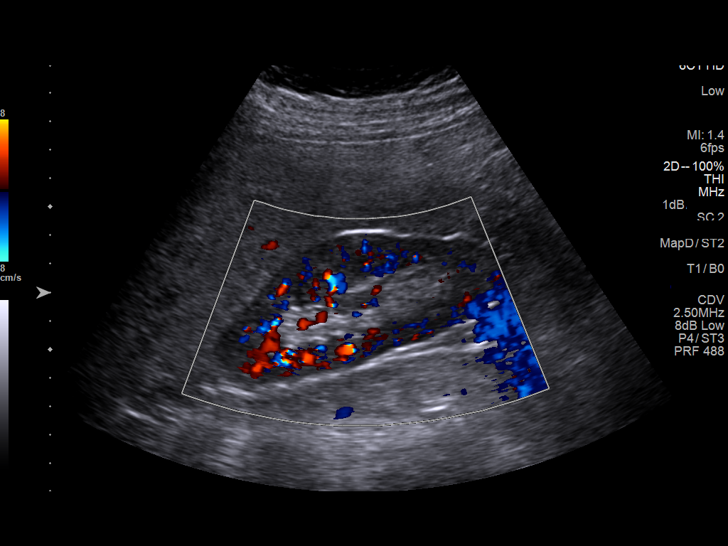
[im 11/61]
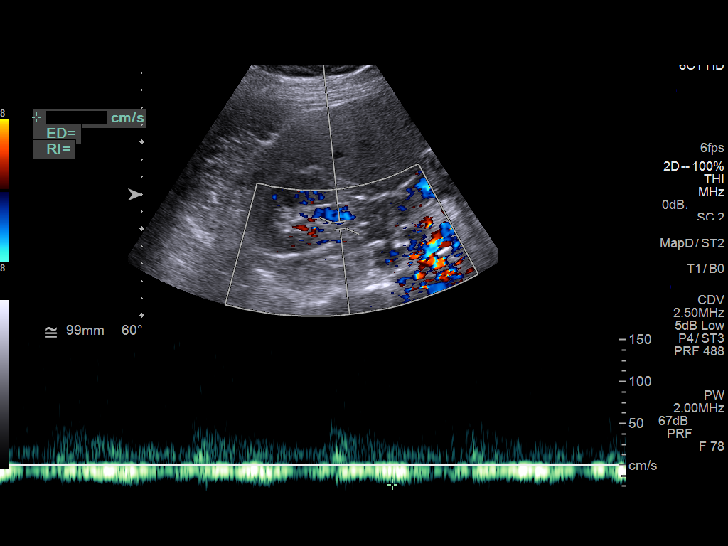
[im 16/61]
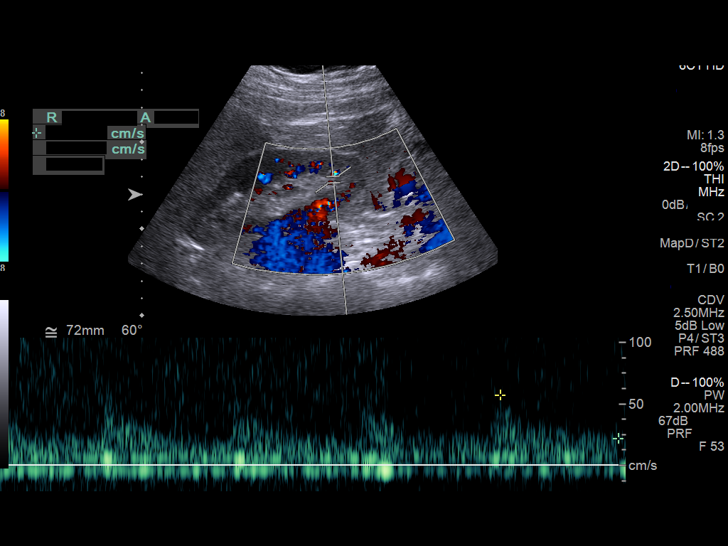
[im 21/61]
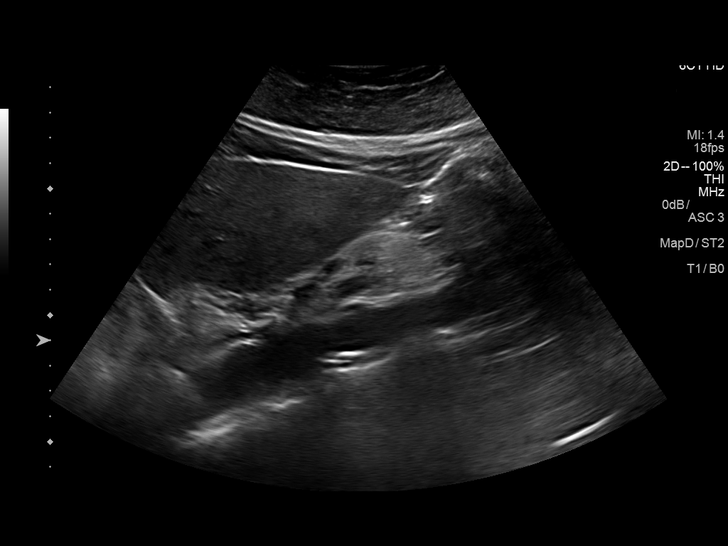
[im 26/61]
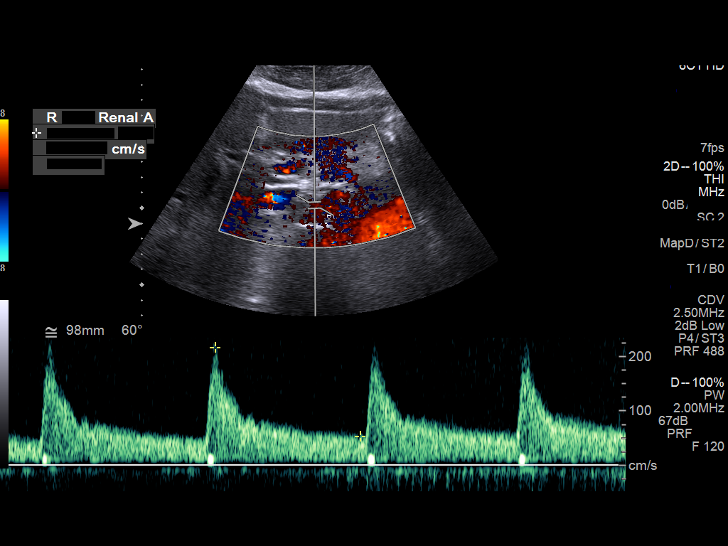
[im 31/61]
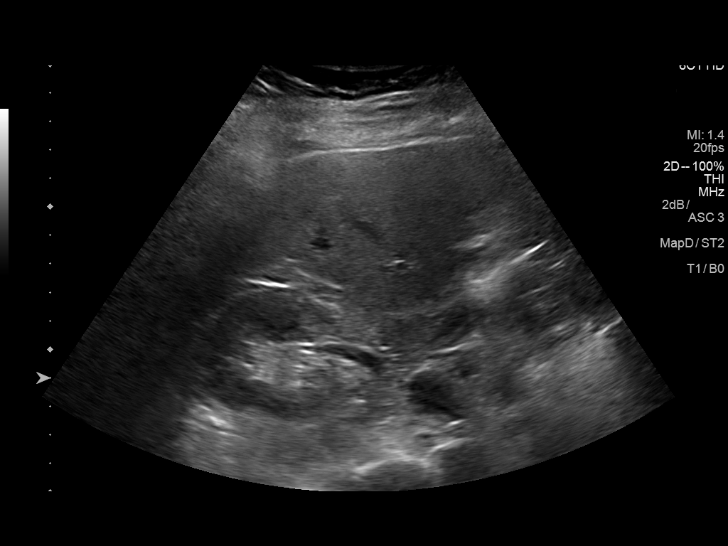
[im 36/61]
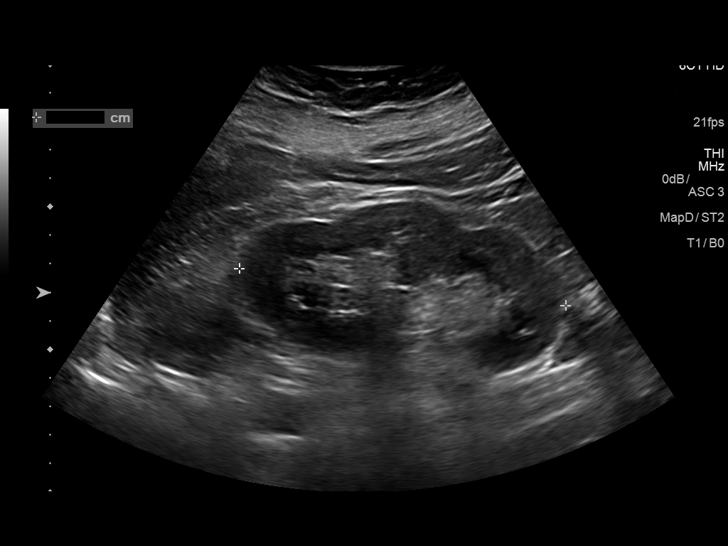
[im 41/61]
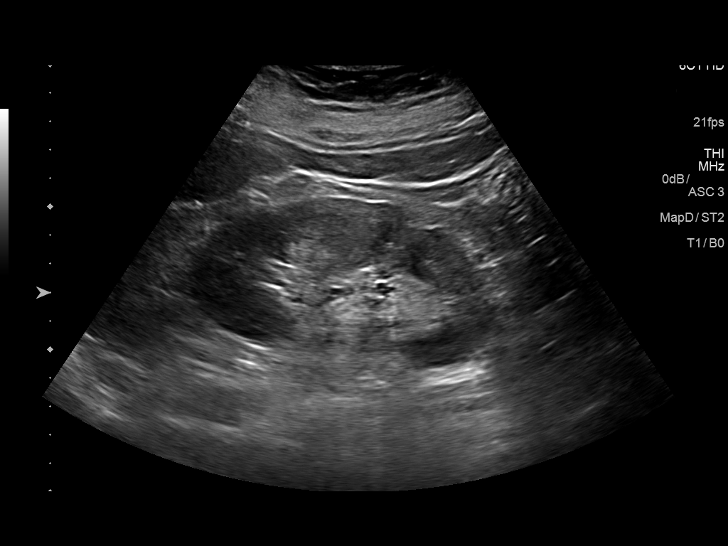
[im 46/61]
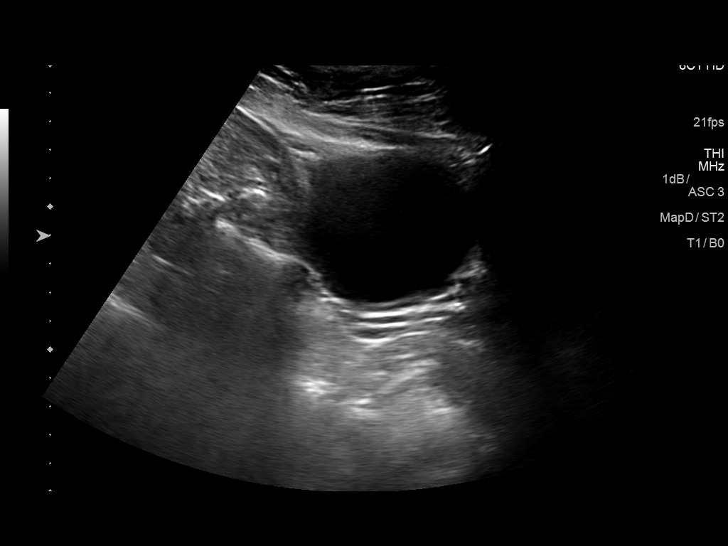
[im 51/61]
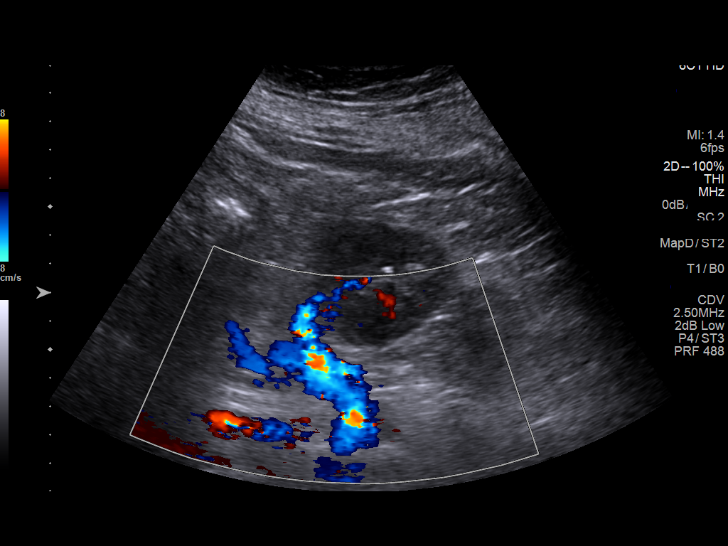
[im 56/61]
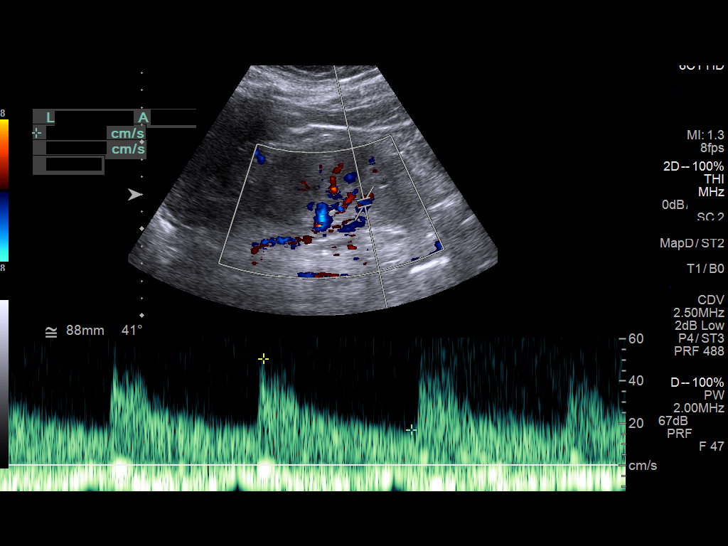
[im 61/61]
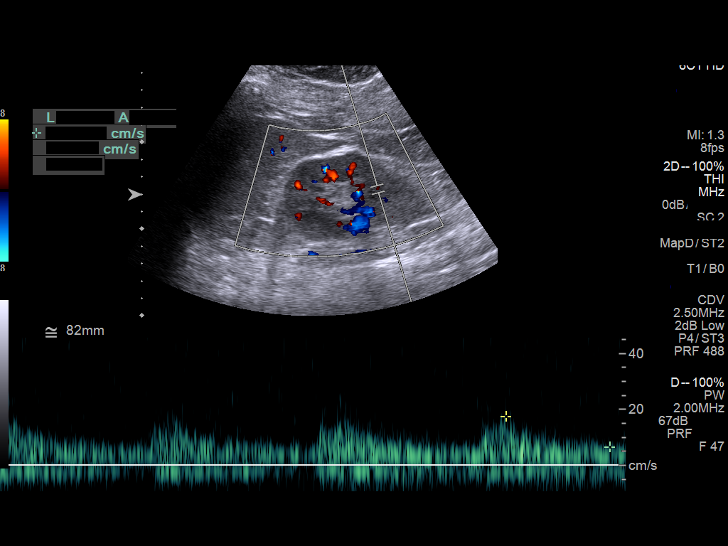

[13 of 25 positions shown; findings below may reference images not displayed]

FINDINGS: Right Kidney:

Normal cortical thickness, echogenicity and size, measuring 11.0 cm
in length. No focal renal lesions. No echogenic renal stones. No
urinary obstruction.

Left Kidney:

Normal cortical thickness, echogenicity and size, measuring 11.5 cm
in length. No focal renal lesions. No echogenic renal stones. No
urinary obstruction.

Bladder:  Appears normal given degree distention.

RENAL DUPLEX ULTRASOUND

Right Renal Artery Velocities:

Origin:  217 cm/sec (images 10 and 27)

Mid:  112 cm/sec

Hilum:  88 cm/sec

Interlobar:  53 cm/sec

Arcuate:  20 cm/sec

Left Renal Artery Velocities:

Origin:  226 cm/sec (image 56)

Mid:  80 cm/sec

Hilum:  93 cm/sec

Interlobar:  51 cm/sec

Arcuate:  23 cm/sec

Aortic Velocity:  117 cm/sec

Right Renal-Aortic Ratios:

Origin:

Mid:

Hilum:

Interlobar:

Arcuate:

Left Renal-Aortic Ratios:

Origin:

Mid:

Hilum:

Interlobar:

Arcuate:

Elevated peak systolic velocities are identified within the origin
of the bilateral renal arteries, potentially indicative of renal
artery stenosis. Otherwise, normal velocities and low resistance
waveforms are demonstrated throughout the bilateral renal artery's
and renal parenchyma peer

The bilateral renal veins appear patent.

There is diffuse increased slightly coarsened echogenicity of the
imaged hepatic parenchyma (image 6), suggestive of hepatic
steatosis.
IMPRESSION: 1. Elevated peak systolic velocities identified within in the origin
of the bilateral renal arteries, potentially indicative of renal
artery stenosis. Further evaluation with CTA of the abdomen pelvis
could be performed as clinically indicated.
2. No evidence of asymmetric renal atrophy or urinary obstruction.
3. Suspected hepatic steatosis.

## 2017-09-28 ENCOUNTER — Encounter: Payer: Self-pay | Admitting: Physician Assistant

## 2017-09-28 ENCOUNTER — Ambulatory Visit (INDEPENDENT_AMBULATORY_CARE_PROVIDER_SITE_OTHER): Payer: Medicare Other | Admitting: Physician Assistant

## 2017-09-28 VITALS — BP 152/96 | HR 88 | Ht 66.0 in | Wt 217.4 lb

## 2017-09-28 DIAGNOSIS — R1013 Epigastric pain: Secondary | ICD-10-CM | POA: Diagnosis not present

## 2017-09-28 DIAGNOSIS — Z860101 Personal history of adenomatous and serrated colon polyps: Secondary | ICD-10-CM

## 2017-09-28 DIAGNOSIS — R110 Nausea: Secondary | ICD-10-CM | POA: Diagnosis not present

## 2017-09-28 DIAGNOSIS — Z86010 Personal history of colonic polyps: Secondary | ICD-10-CM | POA: Diagnosis not present

## 2017-09-28 DIAGNOSIS — R1310 Dysphagia, unspecified: Secondary | ICD-10-CM

## 2017-09-28 DIAGNOSIS — Z8719 Personal history of other diseases of the digestive system: Secondary | ICD-10-CM

## 2017-09-28 MED ORDER — NA SULFATE-K SULFATE-MG SULF 17.5-3.13-1.6 GM/177ML PO SOLN: 1.0000 | Freq: Once | ORAL | 0 refills | Status: AC

## 2017-09-28 MED ORDER — DICYCLOMINE HCL 10 MG PO CAPS: ORAL_CAPSULE | ORAL | 6 refills | Status: DC

## 2017-09-28 NOTE — Progress Notes (Signed)
Agree with assessment and plan as outlined.  

## 2017-09-28 NOTE — Patient Instructions (Addendum)
Continue Dexilant 60 mg.  We sent prescriptions to [facility]. 1. Bentyl ( dicyclomine) 10 mg 2. Suprep  We will contact you with the Plavix instructions when we receive them from the Dr. Jacinta Shoe.   You have been scheduled for a colonoscopy and endoscopy. Please follow written instructions given to you at your visit today.  Please pick up your prep supplies at the pharmacy within the next 1-3 days. [place], [place], [place]. If you use inhalers (even only as needed), please bring them with you on the day of your procedure. Your physician has requested that you go to www.[email or web address] and enter the access code given to you at your visit today. This web site gives a general overview about your procedure. However, you should still follow specific instructions given to you by our office regarding your preparation for the procedure.

## 2017-09-28 NOTE — Progress Notes (Signed)
Subjective:    Patient ID: Katrina [name], female    DOB: 08-Apr-1961, 57 y.o.   MRN: [id number]  HPI Prabhnoor is a pleasant 57 year old white female, known to Dr. Havery Moros who comes in today with primary complaint of dysphasia.  Exline Patient has history of hypertension, cerebrovascular disease, history of prior CVA in 2016 for which she is maintained on Plavix.  She also has COPD, chronic GERD with previous esophageal stricture, adult onset diabetes mellitus with neuropathy, morbid obesity, and was recently diagnosed with multiple sclerosis.  She actually has an appointment later today at [facility] for initial evaluation. Patient had undergone upper endoscopy in May 2018 per Dr. Havery Moros with finding of a 3 cm hiatal hernia, distal esophageal stricture which was balloon dilated from 18 to 20 mm and was also noted to have probable esophageal candidiasis at that time.  Brushings were taken and cytology was positive for Candida and she was treated. Patient had a recent admission in March 2016 with complaints of chest pain.  She was evaluated by cardiology and this was not felt to be related to ischemia.  As part of that work-up she had a barium swallow done which did show mild smooth narrowing of the distal esophagus and a 13 mm barium tablet temporarily lodged. Patient has been maintained over several years on Dexilant 60 mg p.o. daily.  She says generally this works well to help control her heartburn and indigestion though sometimes still has some breakthrough.  She did find previous esophageal dilation helpful but over the past 3 to 4 months has noticed recurrence and is now having symptoms every day.  She will occasionally have difficulty with liquids refluxing back up immediately after swallowing.  Most of her difficulty is with solid food and she is now avoiding bread because every time she eats it gets stuck.  She says these episodes are painful and she frequently will have to regurgitate.  She has  difficulty with most meats as well.  Exline Patient also is complaining of upper abdominal pain over the past 6 months.  She says that she gets a visible KNOT or area of distention in her upper abdomen very frequently after eating associated with nausea and then frequently loose stools postprandially.  She says most days she is having 4-5 loose stools per day she has not seen any melena.  If she has a lot of diarrhea she will take Imodium which does stop it.  She occasionally see small amount of bright red blood only on the tissue.  She does have days of either normal bowel movements or no bowel movements.  Her weight is been stable her appetite is been fine.  She is status post remote cholecystectomy appendectomy hysterectomy and tubal ligation. She had colonoscopy about 6 years ago down in [facility] by her report and was told she had diverticulosis and polyps and should have 5-year interval follow-up. Patient actually had CT of the abdomen and pelvis done in March 2019, while admitted had complaints of chest pain and epigastric pain.  CT scan patient status post cholecystectomy, with stable enlargement of the common bile duct to 15 mm probably compensatory postcholecystectomy no intrahepatic biliary ductal dilation very Stomach appeared normal no evidence of bowel wall thickening, no abdominal wall hernia or other abnormality. LFTs were normal during that admission.  Review of Systems;Pertinent positive and negative review of systems were noted in the above HPI section.  All other review of systems was otherwise negative.  Outpatient Encounter Medications as of 09/28/2017  Medication Sig  . amLODipine (NORVASC) 10 MG tablet Take 1 tablet (10 mg total) by mouth daily.  Marland Kitchen atenolol (TENORMIN) 50 MG tablet Take 1 tablet (50 mg total) by mouth daily.  . chlorthalidone (HYGROTON) 25 MG tablet Take 25 mg by mouth 2 (two) times daily.  Marland Kitchen dexlansoprazole (DEXILANT) 60 MG capsule Take 1 tab by mouth  every morning. (Patient taking differently: Take 60 mg by mouth every evening. )  . EPINEPHrine (EPIPEN 2-PAK) 0.3 mg/0.3 mL IJ SOAJ injection Inject into the muscle.  Marland Kitchen FLOVENT HFA 220 MCG/ACT inhaler Inhale 2 puffs into the lungs 2 (two) times daily.  Marland Kitchen gabapentin (NEURONTIN) 600 MG tablet Take 600 mg by mouth 4 (four) times daily.  . hydrALAZINE (APRESOLINE) 50 MG tablet Take 1 tablet (50 mg total) by mouth 3 (three) times daily.  Marland Kitchen losartan (COZAAR) 50 MG tablet Take 50 mg by mouth 2 (two) times daily.   Marland Kitchen rOPINIRole (REQUIP) 4 MG tablet Take 4 mg by mouth at bedtime.  Marland Kitchen SPIRIVA HANDIHALER 18 MCG inhalation capsule Place 1 puff into inhaler and inhale daily.  Marland Kitchen spironolactone (ALDACTONE) 100 MG tablet Take 100 mg by mouth every evening.   . zolpidem (AMBIEN CR) 12.5 MG CR tablet Take 12.5 mg by mouth at bedtime.  . clopidogrel (PLAVIX) 75 MG tablet Take 75 mg by mouth daily.   Marland Kitchen dicyclomine (BENTYL) 10 MG capsule Take 1 tab 30 min before meals.  . Na Sulfate-K Sulfate-Mg Sulf 17.5-3.13-1.6 GM/177ML SOLN Take 1 kit by mouth once for 1 dose.   No facility-administered encounter medications on file as of 09/28/2017.    Allergies  Allergen Reactions  . Other Anaphylaxis and Swelling    [place]  . Penicillins Anaphylaxis    Has patient had a PCN reaction causing immediate rash, facial/tongue/throat swelling, SOB or lightheadedness with hypotension: Yes Has patient had a PCN reaction causing severe rash involving mucus membranes or skin necrosis: Yes Has patient had a PCN reaction that required hospitalization Yes Has patient had a PCN reaction occurring within the last 10 years: No If all of the above answers are "NO", then may proceed with Cephalosporin use.   . Zithromax [Azithromycin] Anaphylaxis  . Aspirin Other (See Comments)    Due to stomach ulcers.   . Pineapple Rash  . Strawberry Extract Rash and Hives  . Aspartame And Phenylalanine Palpitations  . Mushroom Extract Complex Rash   . Nicardipine Nausea And Vomiting and Other (See Comments)    shaking   Patient Active Problem List   Diagnosis Date Noted  . Chest pain in adult 08/20/2017  . Abnormal brain MRI 08/08/2017  . Weakness of left leg 05/19/2017  . Hypertension 12/03/2016  . Adverse food reaction 10/14/2016  . Pulmonary nodules 10/14/2016  . Non-seasonal allergic rhinitis due to fungal spores 10/14/2016  . Cigarette smoker 09/24/2016  . Cerebral microvascular disease 08/19/2016  . Non compliance with medical treatment 05/11/2016  . Leg pain, anterior, right 05/11/2016  . Syncope 05/02/2016  . Type 2 diabetes mellitus with diabetic neuropathy ([place]) 05/02/2016  . Restless leg syndrome 05/02/2016  . History of CVA with residual deficit   . Vascular headache   . History of chest pain   . Generalized anxiety disorder   . Chronic bilateral low back pain without sciatica   . Peripheral neuropathy   . Migraine without status migrainosus, not intractable   . PTSD (post-traumatic stress disorder)   .  History of cerebrovascular accident (CVA) with residual deficit   . CVA (cerebral vascular accident) ([place]) 04/03/2016  . Stroke (cerebrum) ([place]) 04/03/2016  . Post traumatic stress disorder 10/06/2015  . Precordial pain   . Migraine syndrome 01/24/2015  . Migraine 01/24/2015  . TIA (transient ischemic attack) 12/25/2014  . Hypertensive urgency   . Atypical chest pain   . Malignant hypertension   . Hypokalemia 04/16/2014  . Muscle weakness (generalized) 04/05/2014  . Stiffness of left hip joint 04/05/2014  . Pain in left hip 04/05/2014  . Diastolic dysfunction 74/05/8785  . Normal coronary arteries 03/20/2014  . PUD (peptic ulcer disease) 03/20/2014  . Type 2 diabetes, uncontrolled, with neuropathy ([place]) 03/20/2014  . Morbid obesity due to excess calories ([place]) 03/20/2014  . Left-sided weakness 03/20/2014  . Paresthesias 03/20/2014  . Cerebrovascular disease 03/20/2014  . Dyspnea on exertion  03/05/2012  . Tobacco abuse 03/05/2012  . Moderate COPD (chronic obstructive pulmonary disease) ([place]) 11/23/2011  . GERD (gastroesophageal reflux disease) 11/23/2011  . Uncontrolled hypertension 11/23/2011   Social History   Socioeconomic History  . Marital status: Divorced    Spouse name: Not on file  . Number of children: 3  . Years of education: Some college  . Highest education level: Not on file  Occupational History    Comment: Disability for back pain  Social Needs  . Financial resource strain: Not on file  . Food insecurity:    Worry: Not on file    Inability: Not on file  . Transportation needs:    Medical: Not on file    Non-medical: Not on file  Tobacco Use  . Smoking status: Current Every Day Smoker    Packs/day: 1.50    Years: 45.00    Pack years: 67.50    Types: Cigarettes    Start date: 03/04/1971  . Smokeless tobacco: Never Used  Substance and Sexual Activity  . Alcohol use: No    Alcohol/week: 0.0 oz  . Drug use: No  . Sexual activity: Never    Birth control/protection: Surgical  Lifestyle  . Physical activity:    Days per week: Not on file    Minutes per session: Not on file  . Stress: Not on file  Relationships  . Social connections:    Talks on phone: Not on file    Gets together: Not on file    Attends religious service: Not on file    Active member of club or organization: Not on file    Attends meetings of clubs or organizations: Not on file    Relationship status: Not on file  . Intimate partner violence:    Fear of current or ex partner: Not on file    Emotionally abused: Not on file    Physically abused: Not on file    Forced sexual activity: Not on file  Other Topics Concern  . Not on file  Social History Narrative   Lives at home with her daughter.   Right-handed.   1-2 cups coffee in the morning and 2 sodas per day.    Ms. Standish family history includes Allergic rhinitis in her daughter and father; Asthma in her daughter;  Bipolar disorder in her brother and daughter; Cancer in her father; Coronary artery disease in her father; Depression in her mother; Diabetes in her maternal grandmother; Drug abuse in her brother; Emphysema in her father; Heart attack in her brother and sister; Heart attack (age of onset: 65) in her father; Leukemia in  her brother; Liver disease in her cousin; Skin cancer in her mother; Stomach cancer in her maternal grandfather and other; Stroke in her father; Urticaria in her daughter.      Objective:    Vitals:   09/28/17 0937  BP: (!) 152/96  Pulse: 88    Physical Exam; well-developed white female in no acute distress, pleasant, blood pressure 152/96 pulse 88, height 5 foot 6, weight 217, BMI 35.  HEENT ;nontraumatic normocephalic EOMI PERRLA sclera anicteric, Cardiovascular; regular rate and rhythm with S1-S2 no murmur rub or gallop, Pulmonary ;clear bilaterally, Abdomen ;soft, bowel sounds are present she has some tenderness in the epigastrium but no definite palpable mass or hernia palpable of the abdominal wall, no palpable hepatosplenomegaly.  Rectal; exam not done, Extremities; no clubbing cyanosis or edema skin warm and dry, Neuro psych ;mood and affect appropriate       Assessment & Plan:   #42 57 year old white female with  3 to 40-monthhistory of recurrent solid food dysphagia, history of distal esophageal stricture status post EGD with dilation about 1 year ago, and recent barium swallow showing a smooth narrowing in the distal esophagus consistent with stricture.  #2 upper abdominal pain x6 months exacerbated postprandially occasionally associated with nausea and frequently with diarrhea postprandially Recent CT scan assuring and with no evidence of abdominal wall hernia #3 chronic GERD #4 colon cancer surveillance-patient with history of colon polyps, [facility] and was recommended for 5-year interval  follow-up #5chronic antiplatelet therapy-on Plavix #6 History of CVA 2016 #7  New diagnosis of MS #8  COPD #9 Adult onset diabetes mellitus with neuropathy #10.  Morbid obesity-BMI 35   Plan; Will start trial of Bentyl 10 mg 30 minutes before meals She will be scheduled for upper endoscopy with esophageal dilation and colonoscopy with Dr. AHavery Moros  Both procedures were discussed in detail with patient including indications risks and benefits and she is agreeable to proceed. We will communicate with her cardiologist Dr. KJacinta Shoeto assure that holding Plavix for 5 to 7 days prior to procedures is reasonable for this patient. Continue Dexilant 60 mg every morning  Karmella Bouvier S Shani Fitch PA-C 09/28/2017   Cc: JBerkley Harvey NP

## 2017-09-29 ENCOUNTER — Telehealth: Payer: Self-pay | Admitting: *Deleted

## 2017-09-29 ENCOUNTER — Telehealth: Payer: Self-pay | Admitting: Gastroenterology

## 2017-09-29 NOTE — Telephone Encounter (Signed)
Anderson Malta from [facility] called to inform that pt has an appt with cardiologist on 10/17/17 and clearance of plavix will be addressed on that visit.

## 2017-09-29 NOTE — Telephone Encounter (Signed)
   Primary Cardiologist:Suresh Bronson Ing, MD  Chart reviewed as part of pre-operative protocol coverage.   Patient has a f/u appointment on 5/13 with Dr. Bronson Ing. Pre-op clearance can be addressed at that visit regarding holding of Plavix.  Pre-op covering staff: - Please add APPOINTMENT NOTE for clearance to 5/13 appt - Please contact requesting surgeon's office via preferred method (i.e, phone, fax) to inform them of need for appointment prior to surgery.  Charlie Pitter, PA-C  09/29/2017, 4:03 PM

## 2017-09-29 NOTE — Telephone Encounter (Signed)
Called to let [facility], spoke to [place], she will let Dr. Doyne Keel nurse  know that pt has a f/u appt 10/17/17 and the preop clearance / plavix clearance will be addressed at that visit. si thanked me for the update.  Will complete this encounter.

## 2017-09-29 NOTE — Telephone Encounter (Signed)
Routed to [facility].

## 2017-09-29 NOTE — Telephone Encounter (Signed)
[facility] Pre-operative Risk Assessment     Request for surgical clearance:     Endoscopy Procedure  What type of surgery is being performed?     Endoscopy  When is this surgery scheduled?     12-05-2017  What type of clearance is required ?   Pharmacy  Are there any medications that need to be held prior to surgery and how long? Plavix, we hold it 5 days prior to the Endoscopy.  Practice name and name of physician performing surgery?      [facility]- Dr. Brainards Cellar  What is your office phone and fax number?      Phone- [phone]  [phone]  Anesthesia type (None, local, MAC, general) ?       MAC

## 2017-10-17 ENCOUNTER — Ambulatory Visit (INDEPENDENT_AMBULATORY_CARE_PROVIDER_SITE_OTHER): Payer: Medicare Other | Admitting: Cardiovascular Disease

## 2017-10-17 ENCOUNTER — Encounter: Payer: Self-pay | Admitting: Cardiovascular Disease

## 2017-10-17 VITALS — BP 164/98 | HR 64 | Ht 66.5 in | Wt 220.0 lb

## 2017-10-17 DIAGNOSIS — R600 Localized edema: Secondary | ICD-10-CM

## 2017-10-17 DIAGNOSIS — I639 Cerebral infarction, unspecified: Secondary | ICD-10-CM | POA: Diagnosis not present

## 2017-10-17 DIAGNOSIS — G4730 Sleep apnea, unspecified: Secondary | ICD-10-CM | POA: Diagnosis not present

## 2017-10-17 DIAGNOSIS — I10 Essential (primary) hypertension: Secondary | ICD-10-CM

## 2017-10-17 MED ORDER — CHLORTHALIDONE 25 MG PO TABS: 25.0000 mg | ORAL_TABLET | Freq: Every day | ORAL | 3 refills | Status: DC

## 2017-10-17 NOTE — Patient Instructions (Addendum)
Medication Instructions:   Decrease Chlorthalidone to 25mg  DAILY.  Remain off of the Atenolol, Spironolactone, and Losartan.  Removed from list today.  Continue all other medications.    Labwork:  BMET - order given today.   Do in 1 week.   Office will contact with results via phone or letter.    Testing/Procedures: none  Follow-Up: 3 months   Any Other Special Instructions Will Be Listed Below (If Applicable).  If you need a refill on your cardiac medications before your next appointment, please call your pharmacy.

## 2017-10-17 NOTE — Progress Notes (Signed)
SUBJECTIVE: The patient presents for routine follow-up of malignant hypertension. She also has a history of stroke and medication noncompliance.  She was hospitalized with noncardiac chest pain in March 2019.  She is being scheduled for upper endoscopy with esophageal dilation and colonoscopy.  She has been having problems with ankle and feet swelling.  Her blood pressure is 164/98.  She switched PCPs and several antihypertensive agents were not refilled.  She is no longer taking atenolol, chlorthalidone, losartan, or spironolactone.  She has been taking amlodipine 10 mg daily and presumably hydralazine 50 mg 3 times daily.  She has been more short of breath as well.    Review of Systems: As per "subjective", otherwise negative.  Allergies  Allergen Reactions  . Other Anaphylaxis and Swelling    [place]  . Penicillins Anaphylaxis    Has patient had a PCN reaction causing immediate rash, facial/tongue/throat swelling, SOB or lightheadedness with hypotension: Yes Has patient had a PCN reaction causing severe rash involving mucus membranes or skin necrosis: Yes Has patient had a PCN reaction that required hospitalization Yes Has patient had a PCN reaction occurring within the last 10 years: No If all of the above answers are "NO", then may proceed with Cephalosporin use.   . Zithromax [Azithromycin] Anaphylaxis  . Aspirin Other (See Comments)    Due to stomach ulcers.   . Pineapple Rash  . Strawberry Extract Rash and Hives  . Aspartame And Phenylalanine Palpitations  . Mushroom Extract Complex Rash  . Nicardipine Nausea And Vomiting and Other (See Comments)    shaking    Current Outpatient Medications  Medication Sig Dispense Refill  . amLODipine (NORVASC) 10 MG tablet Take 1 tablet (10 mg total) by mouth daily. 30 tablet 6  . atenolol (TENORMIN) 50 MG tablet Take 1 tablet (50 mg total) by mouth daily. 30 tablet 6  . chlorthalidone (HYGROTON) 25 MG tablet Take 25 mg by  mouth 2 (two) times daily.    . clopidogrel (PLAVIX) 75 MG tablet Take 75 mg by mouth daily.     Marland Kitchen dexlansoprazole (DEXILANT) 60 MG capsule Take 1 tab by mouth every morning. (Patient taking differently: Take 60 mg by mouth every evening. ) 30 capsule 1  . dicyclomine (BENTYL) 10 MG capsule Take 1 tab 30 min before meals. 90 capsule 6  . EPINEPHrine (EPIPEN 2-PAK) 0.3 mg/0.3 mL IJ SOAJ injection Inject into the muscle.    Marland Kitchen FLOVENT HFA 220 MCG/ACT inhaler Inhale 2 puffs into the lungs 2 (two) times daily.  12  . gabapentin (NEURONTIN) 600 MG tablet Take 600 mg by mouth 4 (four) times daily.    . hydrALAZINE (APRESOLINE) 50 MG tablet Take 1 tablet (50 mg total) by mouth 3 (three) times daily. 90 tablet 6  . losartan (COZAAR) 50 MG tablet Take 50 mg by mouth 2 (two) times daily.   3  . rOPINIRole (REQUIP) 4 MG tablet Take 4 mg by mouth at bedtime.    Marland Kitchen SPIRIVA HANDIHALER 18 MCG inhalation capsule Place 1 puff into inhaler and inhale daily.  12  . spironolactone (ALDACTONE) 100 MG tablet Take 100 mg by mouth every evening.     . zolpidem (AMBIEN CR) 12.5 MG CR tablet Take 12.5 mg by mouth at bedtime.     No current facility-administered medications for this visit.     Past Medical History:  Diagnosis Date  . Allergy   . Anemia 1975-1976   . Anxiety  takes Valium daily as needed  . Arthritis    "spine" (12/03/2016)  . Asthma    has inhalers but doesn't use (12/03/2016)  . Chronic bronchitis ([place])    "get it a couple times q yr" (12/03/2016)  . Chronic kidney disease   . Chronic lower back pain    budlging disc   . Claustrophobia   . COPD (chronic obstructive pulmonary disease) ([place])   . Daily headache   . Depression   . Diverticulitis   . Family history of adverse reaction to anesthesia    2 daughters gets extremely sick   . GERD (gastroesophageal reflux disease)    takes Dexilant daily  . Heart murmur   . History of blood transfusion 1975-1976 "several"   no abnormal reaction  noted  . History of colitis   . History of colon polyps    benign  . History of gastric ulcer   . History of hiatal hernia    "small one"  . History of MRSA infection 2017  . Hyperlipidemia    was on meds but has been off over a yr  . Hypertension    takes Amlodipine and Maxzide daily  . Insomnia    takes Ambien nightly  . Iron deficiency anemia    "when I was young"  . Lung nodules   . Migraine    "2-3/wk" (12/03/2016)  . MS (multiple sclerosis) ([place])    questionable per pt  . Noncompliance   . Osteoporosis   . Peripheral neuropathy    weakness,numbness,and tingling. Takes Gabapentin daily  . PTSD (post-traumatic stress disorder) dx'd 2016/2017   "was dx'd w/bipolar in 1991; replaced w/PTSD dx 2016/2017" (12/03/2016)  . Restless leg syndrome    takes Requip daily  . 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 Type II diabetes mellitus ([place])    "went off insulin in 2012/2013" (12/03/2016)    Past Surgical History:  Procedure Laterality Date  . ABDOMINAL AORTOGRAM N/A 12/03/2016   Procedure: Abdominal Aortogram;  Surgeon: Angelia Mould, MD;  Location: [place] [facility];  Service: Cardiovascular;  Laterality: N/A;  . ABDOMINAL HYSTERECTOMY  12/1987  . ADENOIDECTOMY  1975  . ANKLE SURGERY Bilateral 1993; 1995 X2   "stabilzation done; 1 on the right; 2 on the left"  . APPENDECTOMY  1989  . BREAST EXCISIONAL BIOPSY Left   . BREAST LUMPECTOMY Left    "benign tumor"  . CARDIAC CATHETERIZATION N/A 01/31/2015   Procedure: Left Heart Cath and Coronary Angiography;  Surgeon: Burnell Blanks, MD;  Location: [place] [facility];  Service: Cardiovascular;  Laterality: N/A;  . COLONOSCOPY    . DILATION AND CURETTAGE OF UTERUS    . ESOPHAGOGASTRODUODENOSCOPY    . LAPAROSCOPIC CHOLECYSTECTOMY  1996  . NASAL RECONSTRUCTION  1976  . NASAL SINUS SURGERY  1975  . POSTERIOR LUMBAR FUSION  2005    . RADIOLOGY WITH ANESTHESIA N/A 01/15/2016   Procedure: MRI LUMBAR SPINE WITHOUT;  Surgeon: Medication Radiologist, MD;  Location: [place];  Service: Radiology;  Laterality: N/A;  . RADIOLOGY WITH ANESTHESIA N/A 06/29/2016   Procedure: MRI OF THE BRAIN WITH AND WITHOUT;  Surgeon: Medication Radiologist, MD;  Location: [place];  Service: Radiology;  Laterality: N/A;  . RENAL ANGIOGRAPHY N/A 12/03/2016   Procedure: Renal Angiography;  Surgeon: Angelia Mould, MD;  Location: [place] [facility];  Service: Cardiovascular;  Laterality:  N/A;  . TONSILLECTOMY  1992  . TUBAL LIGATION    . 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 Marital status: Divorced    Spouse name: Not on file  . Number of children: 3  . Years of education: Some college  . Highest education level: Not on file  Occupational History    Comment: Disability for back pain  Social Needs  . Financial resource strain: Not on file  . Food insecurity:    Worry: Not on file    Inability: Not on file  . Transportation needs:    Medical: Not on file    Non-medical: Not on file  Tobacco Use  . Smoking status: Current Every Day Smoker    Packs/day: 1.50    Years: 45.00    Pack years: 67.50    Types: Cigarettes    Start date: 03/04/1971  . Smokeless tobacco: Never Used  Substance and Sexual Activity  . Alcohol use: No    Alcohol/week: 0.0 oz  . Drug use: No  . Sexual activity: Never    Birth control/protection: Surgical  Lifestyle  . Physical activity:    Days per week: Not on file    Minutes per session: Not on file  . Stress: Not on file  Relationships  . Social connections:    Talks on phone: Not on file    Gets together: Not on file    Attends religious service: Not on file    Active member of club or organization: Not on file    Attends meetings of clubs or organizations: Not on file    Relationship status:  Not on file  . Intimate partner violence:    Fear of current or ex partner: Not on file    Emotionally abused: Not on file    Physically abused: Not on file    Forced sexual activity: Not on file  Other Topics Concern  . Not on file  Social History Narrative   Lives at home with her daughter.   Right-handed.   1-2 cups coffee in the morning and 2 sodas per day.     Vitals:   10/17/17 1509  BP: (!) 164/98  Pulse: 64  SpO2: 98%  Weight: 220 lb (99.8 kg)  Height: 5' 6.5" (1.689 m)    Wt Readings from Last 3 Encounters:  10/17/17 220 lb (99.8 kg)  09/28/17 217 lb 6.4 oz (98.6 kg)  08/20/17 220 lb (99.8 kg)     PHYSICAL EXAM General: NAD HEENT: Normal. Neck: No JVD, no thyromegaly. Lungs: Clear to auscultation bilaterally with normal respiratory effort. CV: Regular rate and rhythm, normal S1/S2, no S3/S4, no murmur. Trace b/l periankle edema.    Abdomen: Soft, nontender, no distention.  Neurologic: Alert and oriented.  Psych: Normal affect. Skin: Normal. Musculoskeletal: No gross deformities.    ECG: Most recent ECG reviewed.   Labs: Lab Results  Component Value Date/Time   K 4.3 08/23/2017 05:25 AM   BUN 16 08/23/2017 05:25 AM   CREATININE 0.82 08/23/2017 05:25 AM   ALT 31 08/23/2017 05:25 AM   TSH 2.522 08/21/2017 05:25 AM   TSH 3.630 03/20/2014 12:37 AM   HGB 13.0 08/23/2017 05:25 AM     Lipids: Lab Results  Component Value Date/Time   LDLCALC 82 08/21/2017 05:25 AM   CHOL 140 08/21/2017 05:25 AM  TRIG 142 08/21/2017 05:25 AM   HDL 30 (L) 08/21/2017 05:25 AM       ASSESSMENT AND PLAN:  1. Malignant hypertension with end-organ damage (history of stroke): Blood pressure is significantly elevated.  She switched PCPs and several antihypertensive agents were not refilled.  She is no longer taking atenolol, chlorthalidone, losartan, or spironolactone.  She has been taking amlodipine 10 mg daily and presumably hydralazine 50 mg 3 times daily.  She has  been more short of breath as well. I will resume chlorthalidone at 25 mg daily.  I will check a basic metabolic panel within several days of initiation.  2. Sleep disordered breathing: She has yet to pursue the sleep study which I previously ordered. Sleep apnea is a known etiology of uncontrolled hypertension.  3. History of CVA: She is on Plavix.  This can be held for her EGD and colonoscopy.     Disposition: Follow up 3 months   Kate Sable, M.D., F.A.C.C.

## 2017-10-24 ENCOUNTER — Telehealth: Payer: Self-pay | Admitting: *Deleted

## 2017-10-24 NOTE — Telephone Encounter (Signed)
Called the patient to davise per Dr. Earl Gala office note from 10-17-2017. She can hold the Plavix prior to the EGD/Colon.  I advised her that we normally hold the Plavix 5 days. She can stop it on 6-26 through 7-1 and resume it after the procedures. The patient verbaized understanding the instructions.

## 2017-10-24 NOTE — Telephone Encounter (Signed)
Per Dr. Earl Gala note from 10-17-2017, this patient can hold the Plavix for the EGD and Colonoscopy. Patient notified today 10-24-2017.

## 2017-10-25 LAB — BASIC METABOLIC PANEL
BUN: 18 mg/dL (ref 7–25)
CALCIUM: 9.1 mg/dL (ref 8.6–10.4)
CO2: 28 mmol/L (ref 20–32)
Chloride: 107 mmol/L (ref 98–110)
Creat: 0.92 mg/dL (ref 0.50–1.05)
GLUCOSE: 88 mg/dL (ref 65–139)
Potassium: 4.2 mmol/L (ref 3.5–5.3)
SODIUM: 141 mmol/L (ref 135–146)

## 2017-10-28 ENCOUNTER — Telehealth: Payer: Self-pay | Admitting: *Deleted

## 2017-10-28 NOTE — Telephone Encounter (Signed)
Notes recorded by Laurine Blazer, LPN on 7/86/7544 at 9:20 PM EDT Patient notified. Copy to pmd. Follow up scheduled for August with Dr. Bronson Ing.   ------  Notes recorded by Satira Sark, MD on 10/26/2017 at 11:40 AM EDT Patient of Dr. Bronson Ing. Lab work obtained following recent initiation of chlorthalidone 25 mg daily, already on amlodipine and hydralazine. Renal function stable with creatinine 0.92 and potassium normal at 4.2. Continue with current medical regimen and follow-up follow-up plan per Dr. Bronson Ing.

## 2017-11-02 ENCOUNTER — Other Ambulatory Visit: Payer: Self-pay

## 2017-11-02 ENCOUNTER — Emergency Department (HOSPITAL_COMMUNITY): Payer: Medicare Other

## 2017-11-02 ENCOUNTER — Encounter (HOSPITAL_COMMUNITY): Payer: Self-pay | Admitting: Emergency Medicine

## 2017-11-02 ENCOUNTER — Emergency Department (HOSPITAL_COMMUNITY)
Admission: EM | Admit: 2017-11-02 | Discharge: 2017-11-03 | Disposition: A | Discharge status: Home / Self Care | Payer: Medicare Other | Attending: Emergency Medicine | Admitting: Emergency Medicine

## 2017-11-02 DIAGNOSIS — Z79899 Other long term (current) drug therapy: Secondary | ICD-10-CM | POA: Diagnosis not present

## 2017-11-02 DIAGNOSIS — E1140 Type 2 diabetes mellitus with diabetic neuropathy, unspecified: Secondary | ICD-10-CM | POA: Insufficient documentation

## 2017-11-02 DIAGNOSIS — M5489 Other dorsalgia: Secondary | ICD-10-CM | POA: Diagnosis present

## 2017-11-02 DIAGNOSIS — I10 Essential (primary) hypertension: Secondary | ICD-10-CM | POA: Diagnosis not present

## 2017-11-02 DIAGNOSIS — F17210 Nicotine dependence, cigarettes, uncomplicated: Secondary | ICD-10-CM | POA: Diagnosis not present

## 2017-11-02 DIAGNOSIS — R1031 Right lower quadrant pain: Secondary | ICD-10-CM | POA: Diagnosis not present

## 2017-11-02 DIAGNOSIS — Z7902 Long term (current) use of antithrombotics/antiplatelets: Secondary | ICD-10-CM | POA: Diagnosis not present

## 2017-11-02 DIAGNOSIS — J45909 Unspecified asthma, uncomplicated: Secondary | ICD-10-CM | POA: Insufficient documentation

## 2017-11-02 DIAGNOSIS — R109 Unspecified abdominal pain: Secondary | ICD-10-CM

## 2017-11-02 DIAGNOSIS — R300 Dysuria: Secondary | ICD-10-CM | POA: Diagnosis not present

## 2017-11-02 LAB — CBC WITH DIFFERENTIAL/PLATELET
Basophils Absolute: 0.1 10*3/uL (ref 0.0–0.1)
Basophils Relative: 1 %
Eosinophils Absolute: 0.2 10*3/uL (ref 0.0–0.7)
Eosinophils Relative: 2 %
HCT: 40.7 % (ref 36.0–46.0)
Hemoglobin: 13.1 g/dL (ref 12.0–15.0)
Lymphocytes Relative: 43 %
Lymphs Abs: 4.1 10*3/uL — ABNORMAL HIGH (ref 0.7–4.0)
MCH: 28.9 pg (ref 26.0–34.0)
MCHC: 32.2 g/dL (ref 30.0–36.0)
MCV: 89.8 fL (ref 78.0–100.0)
Monocytes Absolute: 0.6 10*3/uL (ref 0.1–1.0)
Monocytes Relative: 6 %
Neutro Abs: 4.5 10*3/uL (ref 1.7–7.7)
Neutrophils Relative %: 48 %
Platelets: 259 10*3/uL (ref 150–400)
RBC: 4.53 MIL/uL (ref 3.87–5.11)
RDW: 14.6 % (ref 11.5–15.5)
WBC: 9.4 10*3/uL (ref 4.0–10.5)

## 2017-11-02 LAB — URINALYSIS, ROUTINE W REFLEX MICROSCOPIC
Bacteria, UA: NONE SEEN
Bilirubin Urine: NEGATIVE
Glucose, UA: NEGATIVE mg/dL
Ketones, ur: NEGATIVE mg/dL
Nitrite: NEGATIVE
Protein, ur: NEGATIVE mg/dL
Specific Gravity, Urine: 1.024 (ref 1.005–1.030)
pH: 6 (ref 5.0–8.0)

## 2017-11-02 LAB — COMPREHENSIVE METABOLIC PANEL
ALT: 15 U/L (ref 14–54)
AST: 17 U/L (ref 15–41)
Albumin: 3.6 g/dL (ref 3.5–5.0)
Alkaline Phosphatase: 70 U/L (ref 38–126)
Anion gap: 8 (ref 5–15)
BUN: 18 mg/dL (ref 6–20)
CO2: 26 mmol/L (ref 22–32)
Calcium: 8.9 mg/dL (ref 8.9–10.3)
Chloride: 107 mmol/L (ref 101–111)
Creatinine, Ser: 0.86 mg/dL (ref 0.44–1.00)
GFR calc Af Amer: >60 mL/min (ref >60)
GFR calc non Af Amer: >60 mL/min (ref >60)
Glucose, Bld: 107 mg/dL — ABNORMAL HIGH (ref 65–99)
Potassium: 4.2 mmol/L (ref 3.5–5.1)
Sodium: 141 mmol/L (ref 135–145)
Total Bilirubin: 0.4 mg/dL (ref 0.3–1.2)
Total Protein: 6.7 g/dL (ref 6.5–8.1)

## 2017-11-02 LAB — CBG MONITORING, ED: Glucose-Capillary: 107 mg/dL — ABNORMAL HIGH (ref 65–99)

## 2017-11-02 MED ORDER — KETOROLAC TROMETHAMINE 30 MG/ML IJ SOLN
15.0000 mg | Freq: Once | INTRAMUSCULAR | Status: AC
  Administered 2017-11-02: 15 mg via INTRAVENOUS
  Filled 2017-11-02: qty 1

## 2017-11-02 MED ORDER — ONDANSETRON HCL 4 MG/2ML IJ SOLN
4.0000 mg | Freq: Once | INTRAMUSCULAR | Status: AC
  Administered 2017-11-02: 4 mg via INTRAVENOUS
  Filled 2017-11-02: qty 2

## 2017-11-02 MED ORDER — SODIUM CHLORIDE 0.9 % IV BOLUS
1000.0000 mL | Freq: Once | INTRAVENOUS | Status: AC
  Administered 2017-11-02: 1000 mL via INTRAVENOUS

## 2017-11-02 MED ORDER — HYDROMORPHONE HCL 1 MG/ML IJ SOLN
1.0000 mg | Freq: Once | INTRAMUSCULAR | Status: AC
  Administered 2017-11-02: 1 mg via INTRAVENOUS
  Filled 2017-11-02: qty 1

## 2017-11-02 NOTE — ED Notes (Signed)
This nurse asked patient if she could urinate. Pt stated she could not at this time.

## 2017-11-02 NOTE — ED Provider Notes (Signed)
Urinalysis not consistent with UTI nitrite negative.  Did have some RBCs.  Urine culture is pending.  Patient stable for discharge home.   Fredia Sorrow, MD 11/02/17 [phone]

## 2017-11-02 NOTE — ED Provider Notes (Signed)
[facility] Provider Note   CSN: [id number] Arrival date & time: 11/02/17  1502     History   Chief Complaint Chief Complaint  Patient presents with  . Back Pain  . Dysuria    HPI Katrina [name] is a 57 y.o. female.  HPI   57 year old female with flank and right lower back to mid back pain.  Onset yesterday.  Persistent since then.  She states the pain radiates from her right flank down to her right groin.  Decreased urinary output.  No dysuria.  Subjective fevers.  Nausea.  No vomiting.  No diarrhea.  No cough.  Past Medical History:  Diagnosis Date  . Allergy   . Anemia 1975-1976   . Anxiety    takes Valium daily as needed  . Arthritis    "spine" (12/03/2016)  . Asthma    has inhalers but doesn't use (12/03/2016)  . Chronic bronchitis ([place])    "get it a couple times q yr" (12/03/2016)  . Chronic kidney disease   . Chronic lower back pain    budlging disc   . Claustrophobia   . COPD (chronic obstructive pulmonary disease) ([place])   . Daily headache   . Depression   . Diverticulitis   . Family history of adverse reaction to anesthesia    2 daughters gets extremely sick   . GERD (gastroesophageal reflux disease)    takes Dexilant daily  . Heart murmur   . History of blood transfusion 1975-1976 "several"   no abnormal reaction noted  . History of colitis   . History of colon polyps    benign  . History of gastric ulcer   . History of hiatal hernia    "small one"  . History of MRSA infection 2017  . Hyperlipidemia    was on meds but has been off over a yr  . Hypertension    takes Amlodipine and Maxzide daily  . Insomnia    takes Ambien nightly  . Iron deficiency anemia    "when I was young"  . Lung nodules   . Migraine    "2-3/wk" (12/03/2016)  . MS (multiple sclerosis) ([place])    questionable per pt  . Noncompliance   . Osteoporosis   . Peripheral neuropathy    weakness,numbness,and tingling. Takes Gabapentin daily  . PTSD  (post-traumatic stress disorder) dx'd 2016/2017   "was dx'd w/bipolar in 1991; replaced w/PTSD dx 2016/2017" (12/03/2016)  . Restless leg syndrome    takes Requip daily  . 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 Type II diabetes mellitus ([place])    "went off insulin in 2012/2013" (12/03/2016)    Patient Active Problem List   Diagnosis Date Noted  . Chest pain in adult 08/20/2017  . Abnormal brain MRI 08/08/2017  . Weakness of left leg 05/19/2017  . Hypertension 12/03/2016  . Adverse food reaction 10/14/2016  . Pulmonary nodules 10/14/2016  . Non-seasonal allergic rhinitis due to fungal spores 10/14/2016  . Cigarette smoker 09/24/2016  . Cerebral microvascular disease 08/19/2016  . Non compliance with medical treatment 05/11/2016  . Leg pain, anterior, right 05/11/2016  . Syncope 05/02/2016  . Type 2 diabetes mellitus with diabetic neuropathy ([place]) 05/02/2016  . Restless leg syndrome 05/02/2016  . History of CVA with residual deficit   . Vascular headache   . History of chest pain   . Generalized  anxiety disorder   . Chronic bilateral low back pain without sciatica   . Peripheral neuropathy   . Migraine without status migrainosus, not intractable   . PTSD (post-traumatic stress disorder)   . History of cerebrovascular accident (CVA) with residual deficit   . CVA (cerebral vascular accident) ([place]) 04/03/2016  . Stroke (cerebrum) ([place]) 04/03/2016  . Post traumatic stress disorder 10/06/2015  . Precordial pain   . Migraine syndrome 01/24/2015  . Migraine 01/24/2015  . TIA (transient ischemic attack) 12/25/2014  . Hypertensive urgency   . Atypical chest pain   . Malignant hypertension   . Hypokalemia 04/16/2014  . Muscle weakness (generalized) 04/05/2014  . Stiffness of left hip joint 04/05/2014  . Pain in left hip 04/05/2014  . Diastolic dysfunction 53/29/9242  . Normal coronary arteries  03/20/2014  . PUD (peptic ulcer disease) 03/20/2014  . Type 2 diabetes, uncontrolled, with neuropathy ([place]) 03/20/2014  . Morbid obesity due to excess calories ([place]) 03/20/2014  . Left-sided weakness 03/20/2014  . Paresthesias 03/20/2014  . Cerebrovascular disease 03/20/2014  . Dyspnea on exertion 03/05/2012  . Tobacco abuse 03/05/2012  . Moderate COPD (chronic obstructive pulmonary disease) ([place]) 11/23/2011  . GERD (gastroesophageal reflux disease) 11/23/2011  . Uncontrolled hypertension 11/23/2011    Past Surgical History:  Procedure Laterality Date  . ABDOMINAL AORTOGRAM N/A 12/03/2016   Procedure: Abdominal Aortogram;  Surgeon: Angelia Mould, MD;  Location: [facility];  Service: Cardiovascular;  Laterality: N/A;  . ABDOMINAL HYSTERECTOMY  12/1987  . ADENOIDECTOMY  1975  . ANKLE SURGERY Bilateral 1993; 1995 X2   "stabilzation done; 1 on the right; 2 on the left"  . APPENDECTOMY  1989  . BREAST EXCISIONAL BIOPSY Left   . BREAST LUMPECTOMY Left    "benign tumor"  . CARDIAC CATHETERIZATION N/A 01/31/2015   Procedure: Left Heart Cath and Coronary Angiography;  Surgeon: Burnell Blanks, MD;  Location: [facility];  Service: Cardiovascular;  Laterality: N/A;  . COLONOSCOPY    . DILATION AND CURETTAGE OF UTERUS    . ESOPHAGOGASTRODUODENOSCOPY    . LAPAROSCOPIC CHOLECYSTECTOMY  1996  . NASAL RECONSTRUCTION  1976  . NASAL SINUS SURGERY  1975  . POSTERIOR LUMBAR FUSION  2005  . RADIOLOGY WITH ANESTHESIA N/A 01/15/2016   Procedure: MRI LUMBAR SPINE WITHOUT;  Surgeon: Medication Radiologist, MD;  Location: [facility];  Service: Radiology;  Laterality: N/A;  . RADIOLOGY WITH ANESTHESIA N/A 06/29/2016   Procedure: MRI OF THE BRAIN WITH AND WITHOUT;  Surgeon: Medication Radiologist, MD;  Location: [facility];  Service: Radiology;  Laterality: N/A;  . RENAL ANGIOGRAPHY N/A 12/03/2016   Procedure: Renal Angiography;  Surgeon: Angelia Mould, MD;  Location: [facility];  Service: Cardiovascular;  Laterality: N/A;  . TONSILLECTOMY  1992  . TUBAL LIGATION    . TUMOR EXCISION  1998   from back of skull; developed;  MRSA from the area that had to be packed     OB History   None      Home Medications    Prior to Admission medications   Medication Sig Start Date End Date Taking? Authorizing Provider  amLODipine (NORVASC) 10 MG tablet Take 1 tablet (10 mg total) by mouth daily. 06/05/14  Yes Herminio Commons, MD  aspirin-acetaminophen-caffeine (EXCEDRIN EXTRA STRENGTH) [phone] MG tablet Take 2 tablets by mouth daily as needed for headache or migraine.    Yes [provider]  chlorthalidone (HYGROTON) 25 MG tablet Take 1  tablet (25 mg total) by mouth daily. 10/17/17 10/16/19 Yes Herminio Commons, MD  clopidogrel (PLAVIX) 75 MG tablet Take 75 mg by mouth daily.    Yes [provider]  dexlansoprazole (DEXILANT) 60 MG capsule Take 1 tab by mouth every morning. Patient taking differently: Take 60 mg by mouth every evening.  10/18/16  Yes Esterwood, Amy S, PA-C  FLOVENT HFA 220 MCG/ACT inhaler Inhale 2 puffs into the lungs 2 (two) times daily. 08/17/17  Yes [provider]  gabapentin (NEURONTIN) 600 MG tablet Take 600 mg by mouth 4 (four) times daily.   Yes [provider]  rOPINIRole (REQUIP) 4 MG tablet Take 4 mg by mouth at bedtime.   Yes [provider]  SPIRIVA HANDIHALER 18 MCG inhalation capsule Place 1 puff into inhaler and inhale daily. 08/15/17  Yes [provider]  zolpidem (AMBIEN CR) 12.5 MG CR tablet Take 12.5 mg by mouth at bedtime. 05/16/17  Yes [provider]  EPINEPHrine (EPIPEN 2-PAK) 0.3 mg/0.3 mL IJ SOAJ injection Inject into the muscle.    [provider]    Family History Family History  Problem Relation Age of Onset  . Coronary artery disease Father   . Emphysema Father   . Heart attack Father 58       Died age 22  . Stroke Father   .  Cancer Father        Unsure of type   . Allergic rhinitis Father   . Depression Mother   . Skin cancer Mother   . Bipolar disorder Brother   . Drug abuse Brother   . Leukemia Brother   . Bipolar disorder Daughter   . Allergic rhinitis Daughter   . Asthma Daughter   . Urticaria Daughter   . Diabetes Maternal Grandmother   . Stomach cancer Maternal Grandfather   . Heart attack Sister        Died in 35s  . Heart attack Brother        Died age 97  . Stomach cancer Other   . Liver disease Cousin   . Angioedema Neg Hx   . Atopy Neg Hx   . Eczema Neg Hx   . Immunodeficiency Neg Hx   . Breast cancer Neg Hx   . Colon cancer Neg Hx     Social History Social History   Tobacco Use  . Smoking status: Current Every Day Smoker    Packs/day: 1.50    Years: 45.00    Pack years: 67.50    Types: Cigarettes    Start date: 03/04/1971  . Smokeless tobacco: Never Used  Substance Use Topics  . Alcohol use: No    Alcohol/week: 0.0 oz  . Drug use: No     Allergies   Other; Penicillins; Zithromax [azithromycin]; Aspirin; Pineapple; Strawberry extract; Aspartame and phenylalanine; Mushroom extract complex; and Nicardipine   Review of Systems Review of Systems  All systems reviewed and negative, other than as noted in HPI.  Physical Exam Updated Vital Signs BP (!) 177/81   Pulse 63   Temp 98.3 F (36.8 C) (Oral)   Resp (!) 25   SpO2 94%   Physical Exam  Constitutional: She appears well-developed and well-nourished. No distress.  HENT:  Head: Normocephalic and atraumatic.  Eyes: Conjunctivae are normal. Right eye exhibits no discharge. Left eye exhibits no discharge.  Neck: Neck supple.  Cardiovascular: Normal rate, regular rhythm and normal heart sounds. Exam reveals no gallop and no friction rub.  No murmur heard. Pulmonary/Chest: Effort normal and breath sounds normal. No respiratory distress.  Abdominal: Soft. She exhibits no distension. There is no tenderness.  Mild  suprapubic periumbilical and right lower quadrant tenderness without rebound or guarding.  No CVA tenderness.  Musculoskeletal: She exhibits no edema or tenderness.  Neurological: She is alert.  Skin: Skin is warm and dry.  Psychiatric: She has a normal mood and affect. Her behavior is normal. Thought content normal.  Nursing note and vitals reviewed.    ED Treatments / Results  Labs (all labs ordered are listed, but only abnormal results are displayed) Labs Reviewed  URINE CULTURE - Abnormal; Notable for the following components:      Result Value   Culture MULTIPLE SPECIES PRESENT, SUGGEST RECOLLECTION (*)    All other components within normal limits  COMPREHENSIVE METABOLIC PANEL - Abnormal; Notable for the following components:   Glucose, Bld 107 (*)    All other components within normal limits  URINALYSIS, ROUTINE W REFLEX MICROSCOPIC - Abnormal; Notable for the following components:   APPearance HAZY (*)    Hgb urine dipstick SMALL (*)    Leukocytes, UA TRACE (*)    All other components within normal limits  CBC WITH DIFFERENTIAL/PLATELET - Abnormal; Notable for the following components:   Lymphs Abs 4.1 (*)    All other components within normal limits  CBG MONITORING, ED - Abnormal; Notable for the following components:   Glucose-Capillary 107 (*)    All other components within normal limits    EKG None  Radiology Ct Renal Stone Study  Result Date: 11/02/2017 CLINICAL DATA:  Patient with history of right flank pain. Possible renal stone. EXAM: CT ABDOMEN AND PELVIS WITHOUT CONTRAST TECHNIQUE: Multidetector CT imaging of the abdomen and pelvis was performed following the standard protocol without IV contrast. COMPARISON:  CT abdomen pelvis 08/20/2017. FINDINGS: Lower chest: Normal heart size. Dependent atelectasis within the bilateral lower lobes. No pleural effusion. Stable 3 mm left lower lobe nodule (image 9; series 4). Hepatobiliary: Liver is normal in size and  contour. Patient status post cholecystectomy. Stable prominent common bile duct, likely physiologic given cholecystectomy state. Pancreas: Unremarkable Spleen: Unremarkable Adrenals/Urinary Tract: Adrenal glands are normal. Kidneys are symmetric in size. No hydronephrosis. No nephroureterolithiasis. Urinary bladder is unremarkable. Stomach/Bowel: No abnormal bowel wall thickening or evidence for bowel obstruction. No free fluid or free intraperitoneal air. Normal morphology of the stomach. Vascular/Lymphatic: Normal caliber abdominal aorta. Peripheral calcified atherosclerotic plaque. No retroperitoneal lymphadenopathy. Reproductive: Status post hysterectomy. Other: None. Musculoskeletal: Lumbar spine degenerative changes. Postsurgical hardware. IMPRESSION: No acute process within the abdomen or pelvis. Electronically Signed   By: Lovey Newcomer M.D.   On: 11/02/2017 20:58    Procedures Procedures (including critical care time)  Medications Ordered in ED Medications  sodium chloride 0.9 % bolus 1,000 mL (1,000 mLs Intravenous New Bag/Given 11/02/17 2012)  HYDROmorphone (DILAUDID) injection 1 mg (1 mg Intravenous Given 11/02/17 2013)  ketorolac (TORADOL) 30 MG/ML injection 15 mg (15 mg Intravenous Given 11/02/17 2012)  ondansetron (ZOFRAN) injection 4 mg (4 mg Intravenous Given 11/02/17 2104)     Initial Impression / Assessment and Plan / ED Course  I have reviewed the triage vital signs and the nursing notes.  Pertinent labs & imaging results that were available during my care of the patient were reviewed by me and considered in my medical decision making (see chart for details).     57 year old female with right flank pain.  She may have  a UTI/Pilo.  CT is negative for stranding around her kidney though.  No ureteral stone or other acute pathology noted.  She is afebrile here although she is complaining of some subjective fevers.  She has no leukocytosis.  Urinalysis is pending.  Antibiotics does  look consistent with a UTI.  Discharge with symptomatic treatment otherwise.  Final Clinical Impressions(s) / ED Diagnoses   Final diagnoses:  Flank pain    ED Discharge Orders    None       Virgel Manifold, MD 11/05/17 2109

## 2017-11-02 NOTE — ED Triage Notes (Signed)
Pt c/o fever x 2 days. states has only dribbled urine since yesterday am. Nausea, gen weakness. right lower back pain that radiates into suprapubic. Seen by pcp and sent here for poss kidney stone or sepsis.
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
## 2017-11-04 LAB — URINE CULTURE

## 2017-11-07 ENCOUNTER — Encounter (HOSPITAL_COMMUNITY): Payer: Self-pay | Admitting: Psychiatry

## 2017-11-07 ENCOUNTER — Ambulatory Visit (INDEPENDENT_AMBULATORY_CARE_PROVIDER_SITE_OTHER): Payer: Medicare Other | Admitting: Psychiatry

## 2017-11-07 VITALS — BP 167/95 | HR 95 | Ht 66.5 in | Wt 218.0 lb

## 2017-11-07 DIAGNOSIS — F17210 Nicotine dependence, cigarettes, uncomplicated: Secondary | ICD-10-CM

## 2017-11-07 DIAGNOSIS — G4700 Insomnia, unspecified: Secondary | ICD-10-CM

## 2017-11-07 DIAGNOSIS — F4310 Post-traumatic stress disorder, unspecified: Secondary | ICD-10-CM | POA: Diagnosis not present

## 2017-11-07 DIAGNOSIS — R45851 Suicidal ideations: Secondary | ICD-10-CM

## 2017-11-07 DIAGNOSIS — F4010 Social phobia, unspecified: Secondary | ICD-10-CM | POA: Diagnosis not present

## 2017-11-07 DIAGNOSIS — F329 Major depressive disorder, single episode, unspecified: Secondary | ICD-10-CM | POA: Diagnosis not present

## 2017-11-07 DIAGNOSIS — Z818 Family history of other mental and behavioral disorders: Secondary | ICD-10-CM | POA: Diagnosis not present

## 2017-11-07 MED ORDER — ESCITALOPRAM OXALATE 10 MG PO TABS: ORAL_TABLET | ORAL | 0 refills | Status: DC

## 2017-11-07 MED ORDER — PRAZOSIN HCL 1 MG PO CAPS: ORAL_CAPSULE | ORAL | 0 refills | Status: DC

## 2017-11-07 NOTE — Patient Instructions (Addendum)
1. Start lexapro 5 mg daily for one week, then 10 mg daily  2. Day 1 : Prazosin 1 mg at night, Day 2: Prazosin 2 mg at night, Day 3: Prazosin 3 mg at night 3. Referral to therapy 4. Return to clinic in one month for 30 mins

## 2017-11-18 ENCOUNTER — Encounter (HOSPITAL_COMMUNITY): Payer: Self-pay | Admitting: Licensed Clinical Social Worker

## 2017-11-18 ENCOUNTER — Ambulatory Visit (INDEPENDENT_AMBULATORY_CARE_PROVIDER_SITE_OTHER): Payer: Medicare Other | Admitting: Licensed Clinical Social Worker

## 2017-11-18 DIAGNOSIS — F4310 Post-traumatic stress disorder, unspecified: Secondary | ICD-10-CM | POA: Diagnosis not present
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
## 2017-11-23 ENCOUNTER — Ambulatory Visit (HOSPITAL_COMMUNITY)
Admission: RE | Admit: 2017-11-23 | Discharge: 2017-11-23 | Disposition: A | Discharge status: Home / Self Care | Payer: Medicare Other | Source: Ambulatory Visit | Attending: Family Medicine | Admitting: Family Medicine

## 2017-11-23 ENCOUNTER — Other Ambulatory Visit (HOSPITAL_COMMUNITY): Payer: Self-pay | Admitting: Family Medicine

## 2017-11-23 DIAGNOSIS — J410 Simple chronic bronchitis: Secondary | ICD-10-CM | POA: Diagnosis not present

## 2017-11-23 DIAGNOSIS — R071 Chest pain on breathing: Secondary | ICD-10-CM | POA: Insufficient documentation

## 2017-11-23 DIAGNOSIS — R0602 Shortness of breath: Secondary | ICD-10-CM

## 2017-11-23 DIAGNOSIS — I700 Atherosclerosis of aorta: Secondary | ICD-10-CM | POA: Diagnosis not present

## 2017-12-01 ENCOUNTER — Ambulatory Visit (INDEPENDENT_AMBULATORY_CARE_PROVIDER_SITE_OTHER): Payer: Medicare Other | Admitting: Licensed Clinical Social Worker

## 2017-12-01 DIAGNOSIS — F4310 Post-traumatic stress disorder, unspecified: Secondary | ICD-10-CM

## 2017-12-02 ENCOUNTER — Encounter (HOSPITAL_COMMUNITY): Payer: Self-pay | Admitting: Licensed Clinical Social Worker
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
## 2017-12-05 ENCOUNTER — Encounter: Payer: Self-pay | Admitting: Gastroenterology

## 2017-12-05 ENCOUNTER — Other Ambulatory Visit: Payer: Self-pay

## 2017-12-05 ENCOUNTER — Ambulatory Visit (AMBULATORY_SURGERY_CENTER): Payer: Medicare Other | Admitting: Gastroenterology

## 2017-12-05 VITALS — BP 166/89 | HR 76 | Temp 97.8°F | Resp 26 | Ht 66.0 in | Wt 217.0 lb

## 2017-12-05 DIAGNOSIS — K222 Esophageal obstruction: Secondary | ICD-10-CM

## 2017-12-05 DIAGNOSIS — D122 Benign neoplasm of ascending colon: Secondary | ICD-10-CM

## 2017-12-05 DIAGNOSIS — R197 Diarrhea, unspecified: Secondary | ICD-10-CM | POA: Diagnosis not present

## 2017-12-05 DIAGNOSIS — D123 Benign neoplasm of transverse colon: Secondary | ICD-10-CM

## 2017-12-05 DIAGNOSIS — R1310 Dysphagia, unspecified: Secondary | ICD-10-CM | POA: Diagnosis not present

## 2017-12-05 DIAGNOSIS — R1013 Epigastric pain: Secondary | ICD-10-CM | POA: Diagnosis not present

## 2017-12-05 DIAGNOSIS — D125 Benign neoplasm of sigmoid colon: Secondary | ICD-10-CM | POA: Diagnosis not present

## 2017-12-05 DIAGNOSIS — Z86010 Personal history of colonic polyps: Secondary | ICD-10-CM | POA: Diagnosis not present

## 2017-12-05 IMAGING — CT CT HEAD W/O CM
3 series · 15 of 46 positions shown, 18 images · non-contrast
Comparison: 03/02/2016, 10/25/2015, 10/24/2015

CLINICAL DATA: Altered mental status, dizziness left-sided upper
and lower extremity weakness

EXAM:
CT HEAD WITHOUT CONTRAST
TECHNIQUE: Contiguous axial images were obtained from the base of the skull
through the vertex without intravenous contrast.

[Series 2: head code stroke wo · axial · 0.42mm/px · z∈[+1276,+1396]mm · 9 of 29 slices shown, 12 images]
[im 3/29  brain]
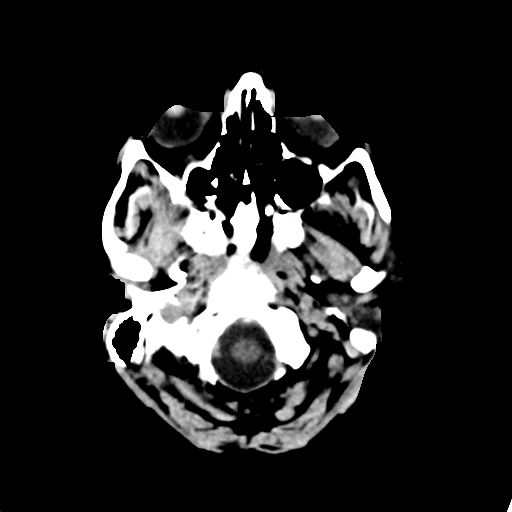
[im 3/29  bone]
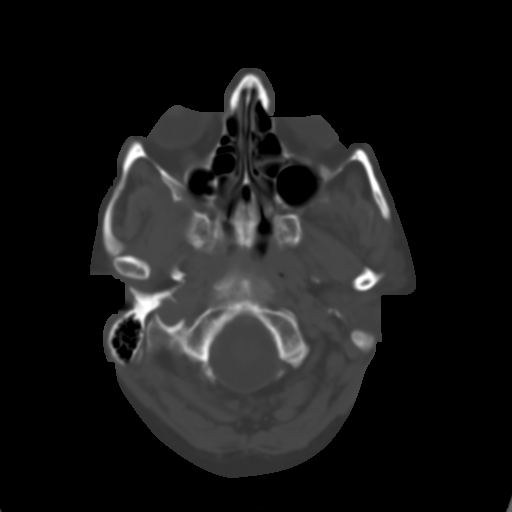
[im 6/29  brain]
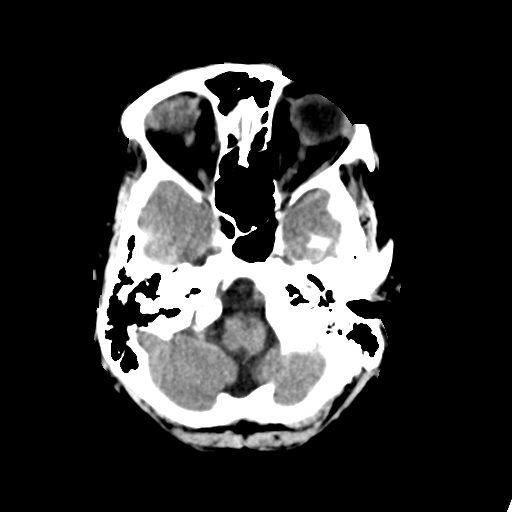
[im 9/29  brain]
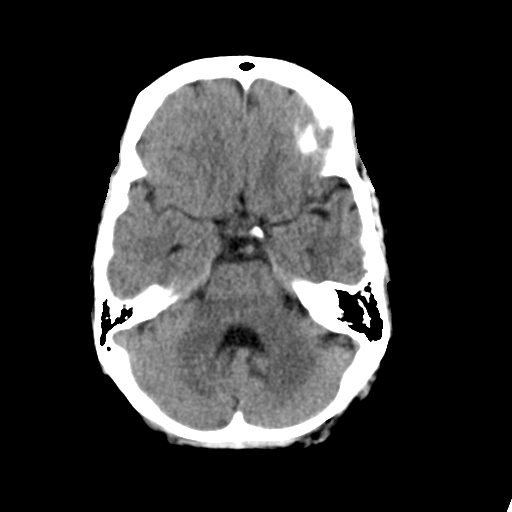
[im 12/29  brain]
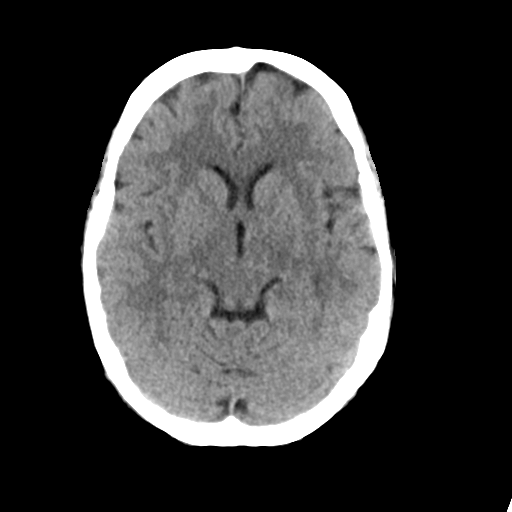
[im 15/29  brain]
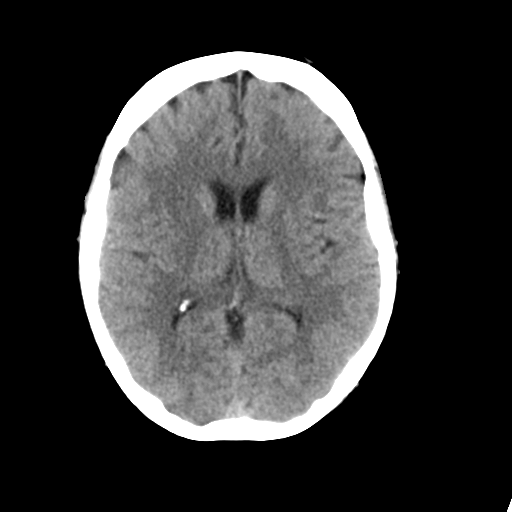
[im 15/29  bone]
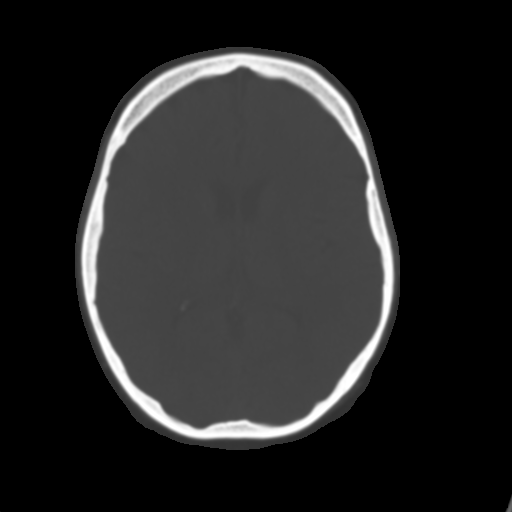
[im 18/29  brain]
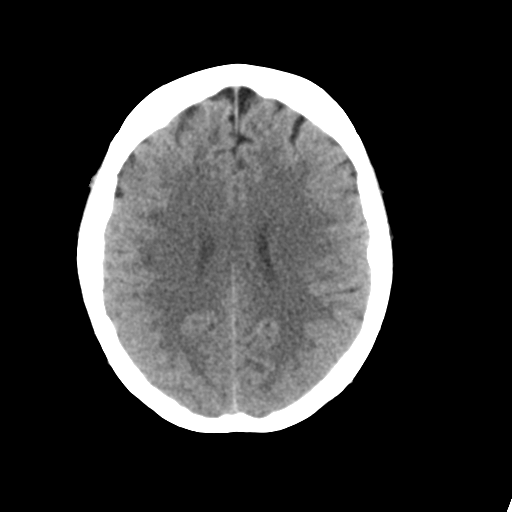
[im 21/29  brain]
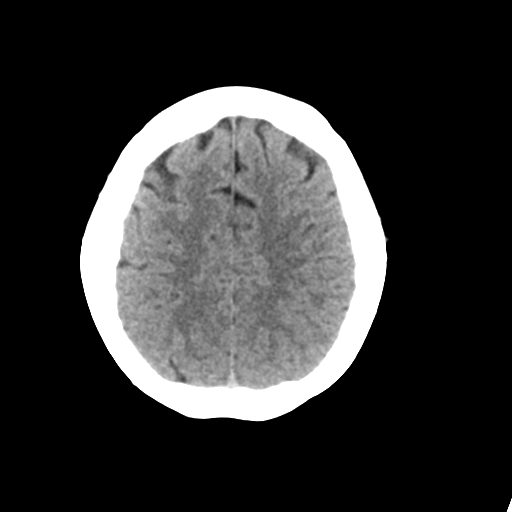
[im 24/29  brain]
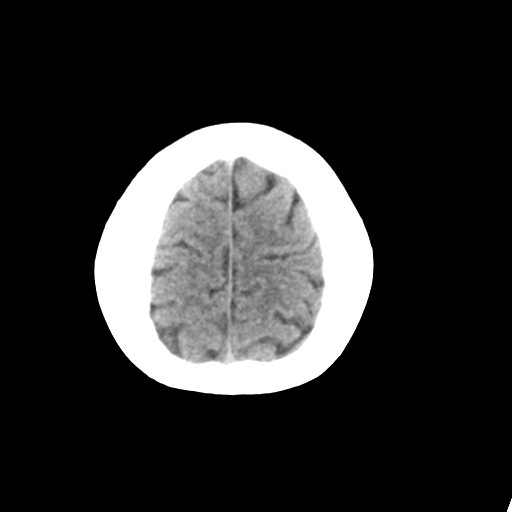
[im 27/29  brain]
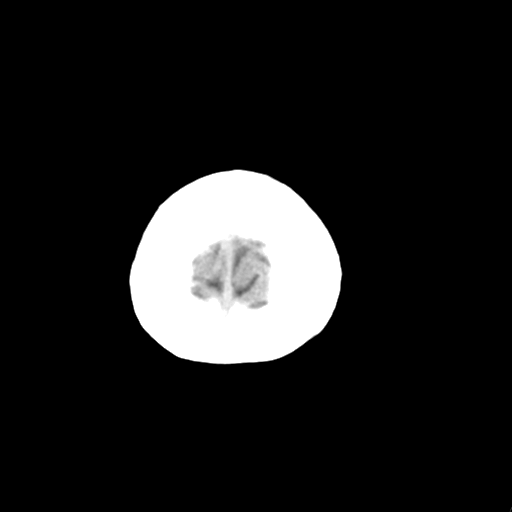
[im 27/29  bone]
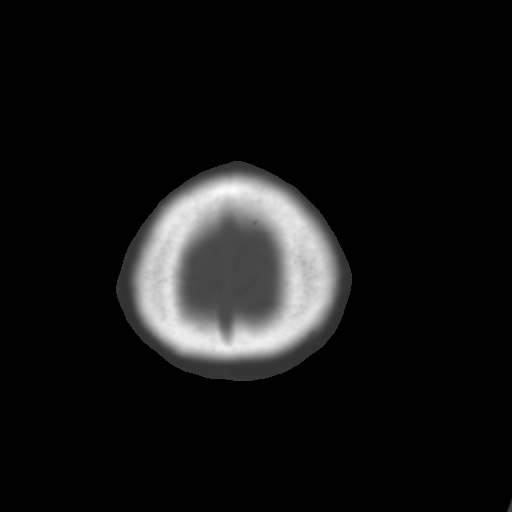

[Series 4: coronal soft tissue · coronal · 0.35mm/px · 3 of 65 slices shown]
[im 22/65  brain]
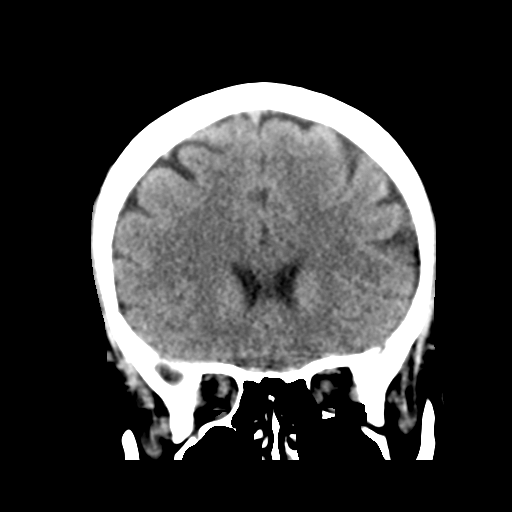
[im 29/65  brain]
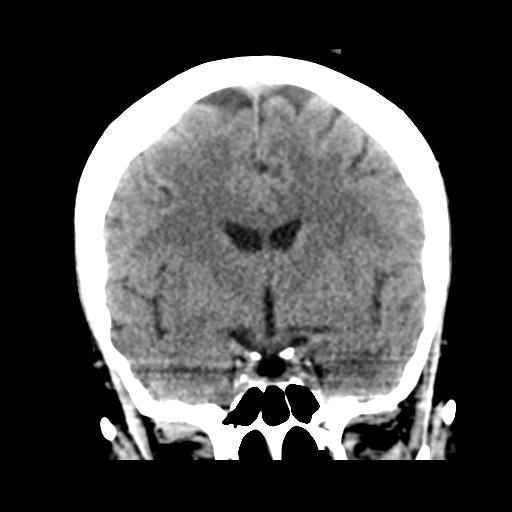
[im 36/65  brain]
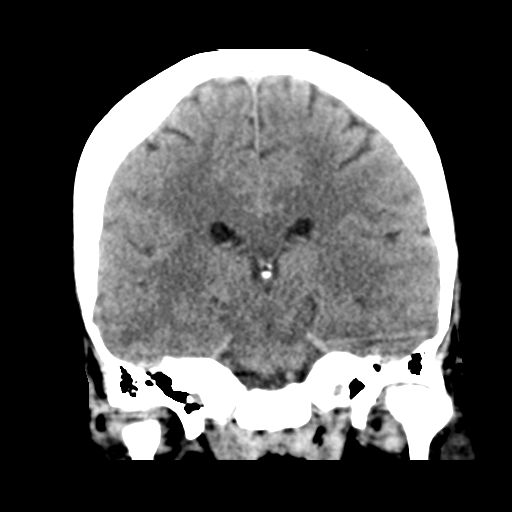

[Series 5: sagittal soft tissue · sagittal · 0.32mm/px · 3 of 67 slices shown]
[im 23/67  brain]
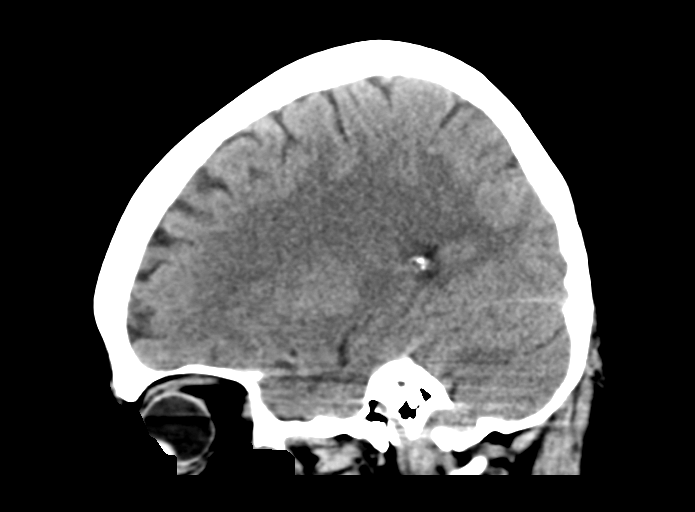
[im 34/67  brain]
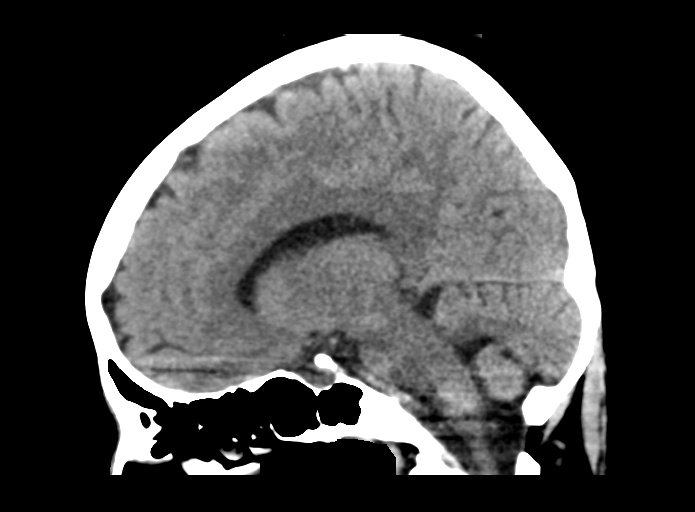
[im 45/67  brain]
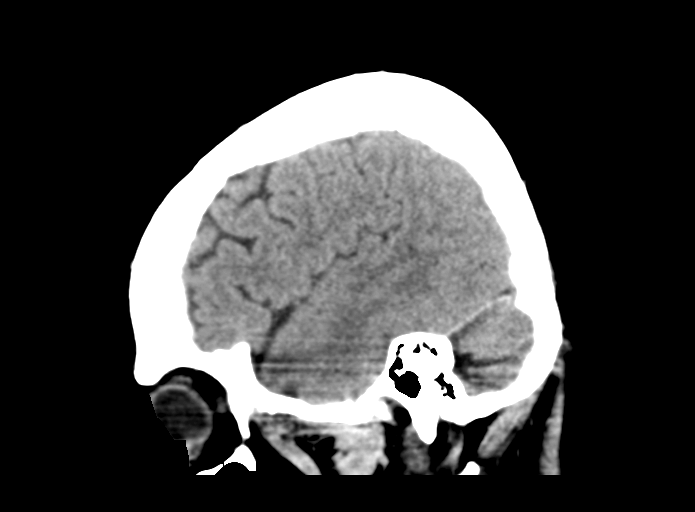

[15 of 46 positions shown; findings below may reference images not displayed]

FINDINGS: Brain: No evidence of acute infarction, hemorrhage, hydrocephalus,
extra-axial collection or mass lesion/mass effect. Nonspecific
subcortical white matter hypodensities are unchanged.

Vascular: No hyperdense vessels. Mild vascular calcification in the
carotid arteries.

Skull: Mastoid air cells clear. No fracture or suspicious bone
lesion.

Sinuses/Orbits: Sinuses are clear except for small polyp or cyst in
the left maxillary sinus. No acute orbital abnormality.

Other: None
IMPRESSION: No CT evidence for acute intracranial abnormality. Few subcortical
white matter hypodense lesions, unchanged.

Critical Value/emergent results were called by telephone at the time
of interpretation on 04/03/2016 at [DATE] to Dr. NOMASIBULELE MOATSHE ,
who verbally acknowledged these results.

## 2017-12-05 IMAGING — DX DG CHEST 2V
2 series · 2 of 2 positions shown · non-contrast
Comparison: CXR 03/02/2016

CLINICAL DATA: Altered mental status. Left-sided facial, left arm
and left leg pain and weakness.

EXAM:
CHEST  2 VIEW

[chest pa]
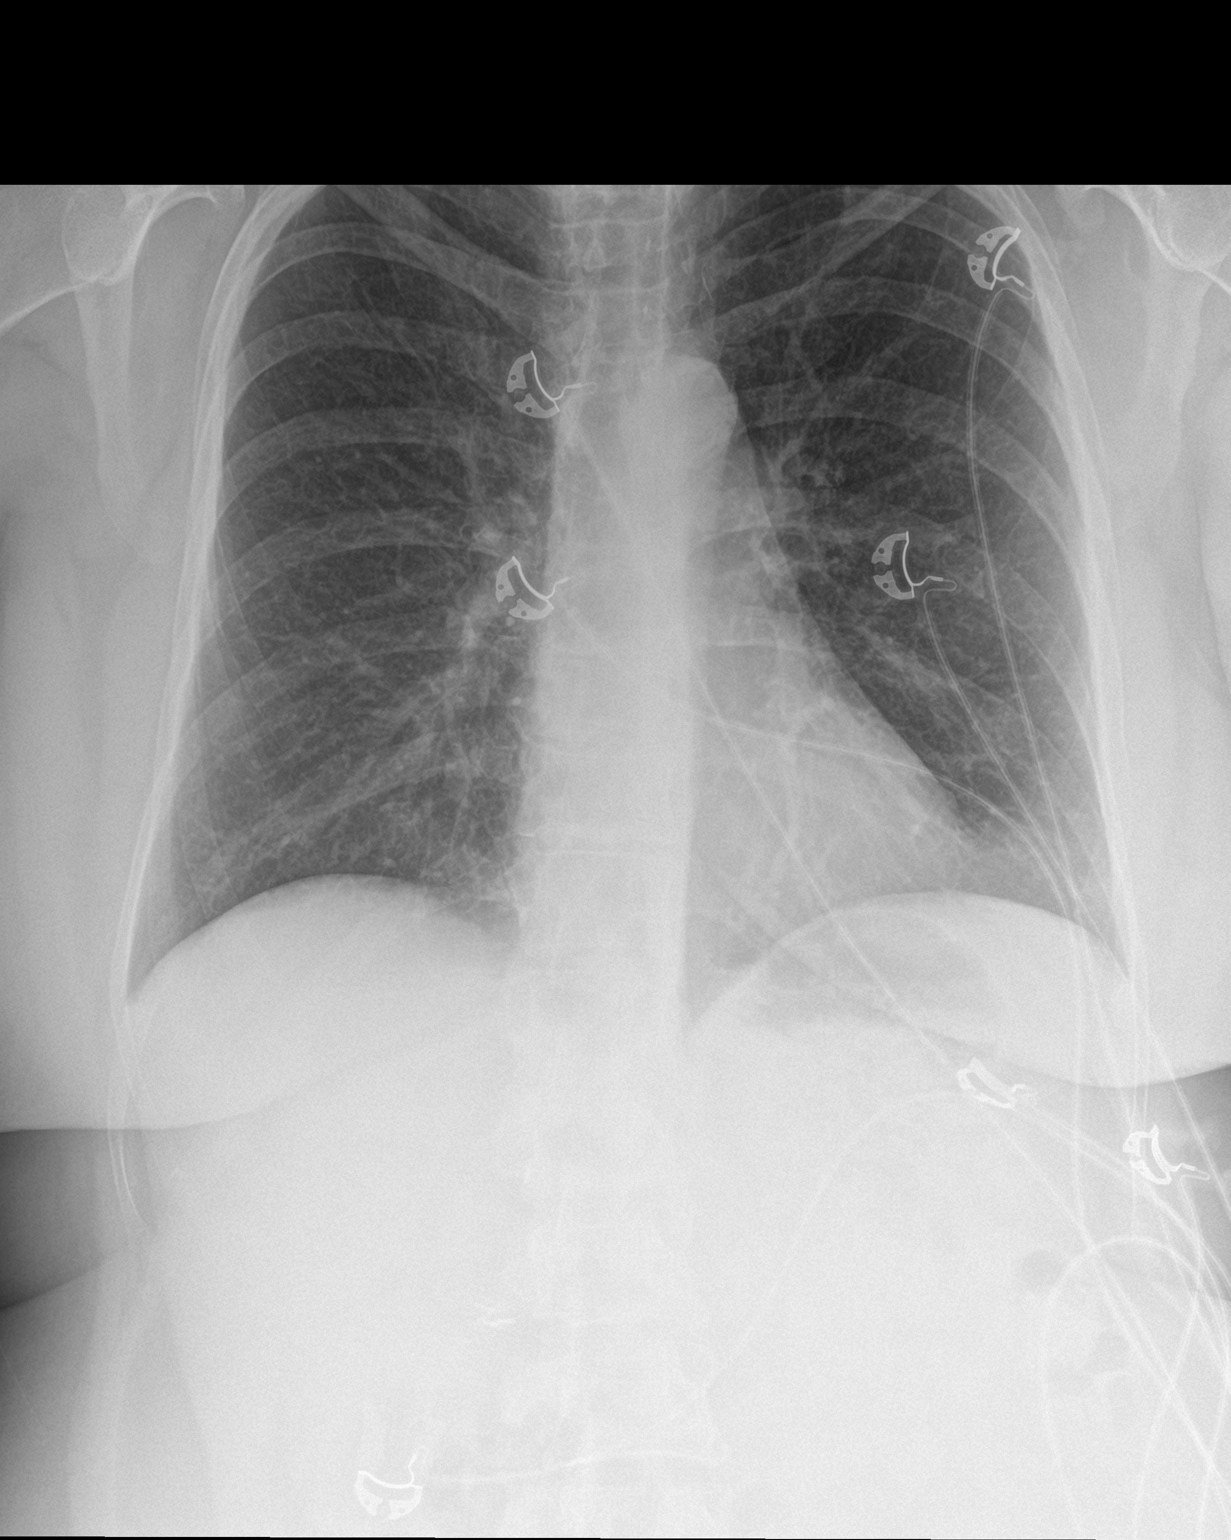

[chest lat]
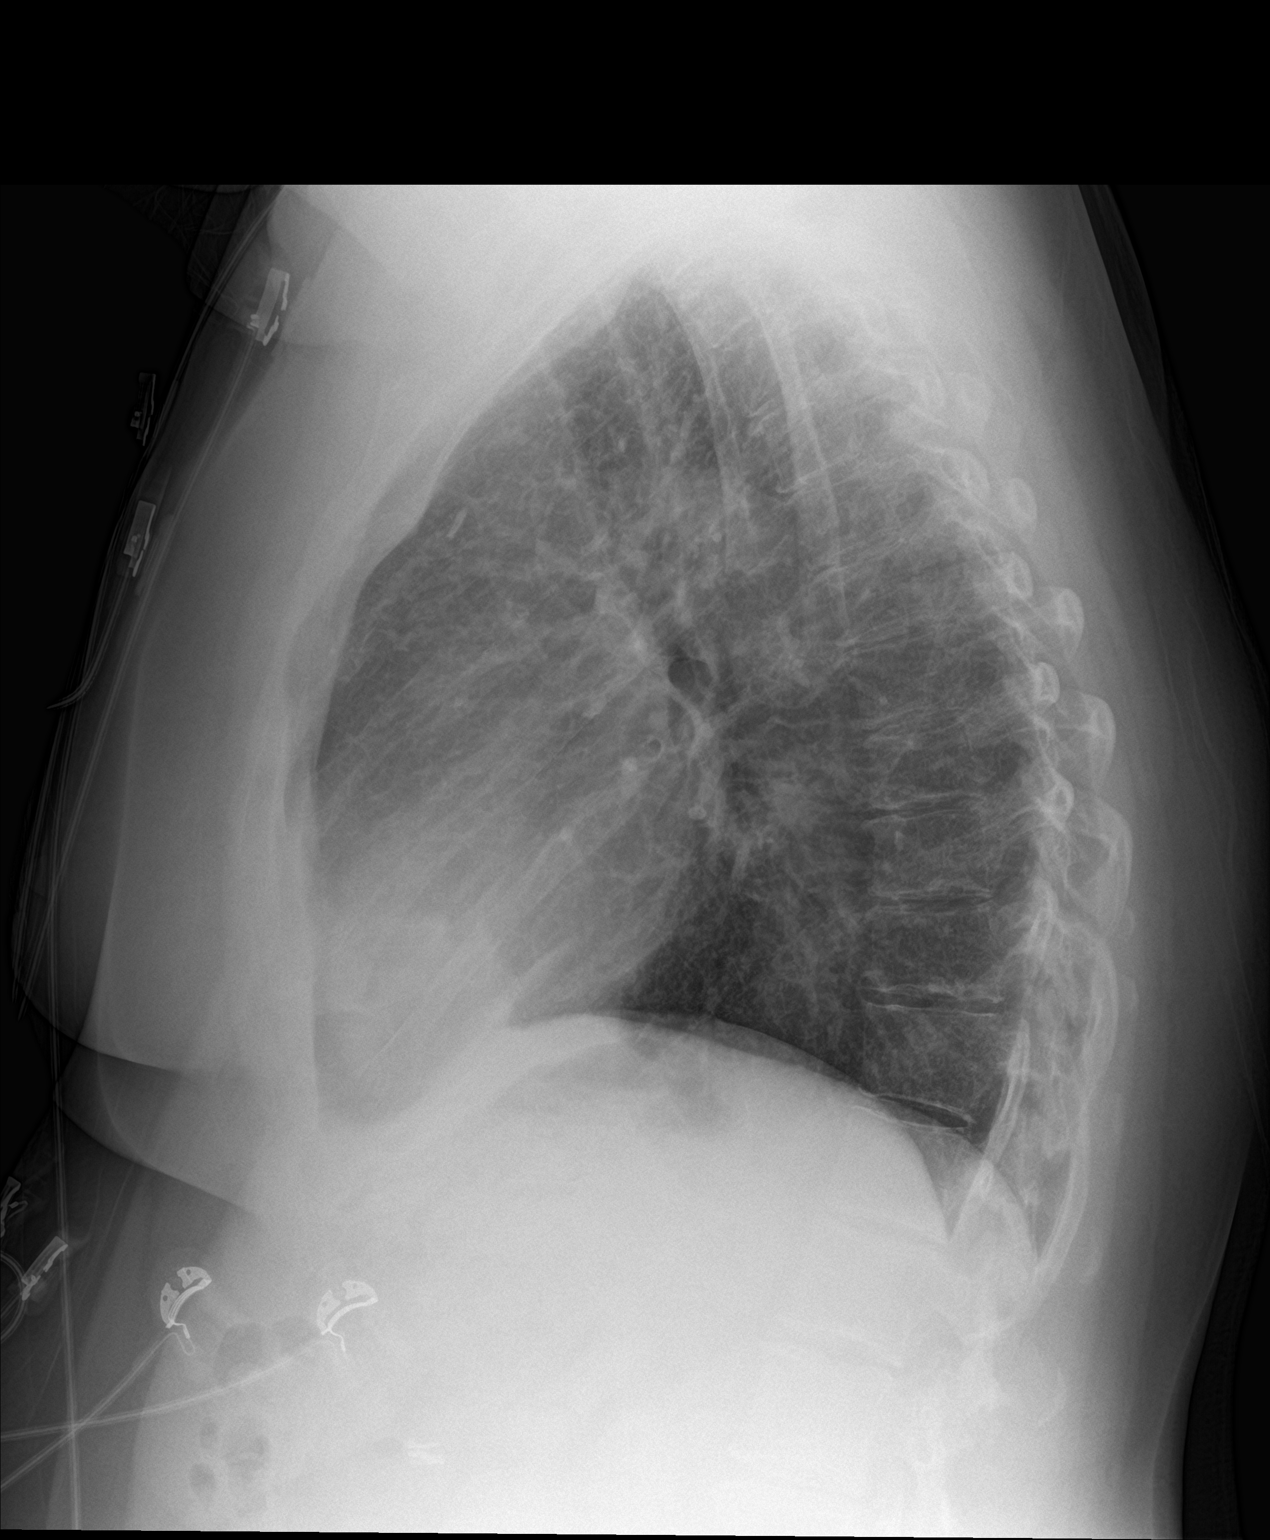

[2 of 2 positions shown; findings below may reference images not displayed]

FINDINGS: The heart size and mediastinal contours are within normal limits.
Both lungs are clear. The visualized skeletal structures are
unremarkable.
IMPRESSION: No active cardiopulmonary disease.

## 2017-12-05 MED ORDER — SODIUM CHLORIDE 0.9 % IV SOLN: 500.0000 mL | Freq: Once | INTRAVENOUS | Status: DC

## 2017-12-05 NOTE — Progress Notes (Signed)
Patient was complaining of lower abdominal pain, about an 8 on pain scale. Levsin 0.25mg  given. Patient passing gas and belching. Walked patient to restroom. Abdominal pain somewhat relieved now a 4/10, encouraged patient to ambulate at home, drink warm fluids and to continue to pass gas. Patient verbalized understanding and discharged.

## 2017-12-05 NOTE — Progress Notes (Signed)
Report given to PACU, vss 

## 2017-12-05 NOTE — Progress Notes (Signed)
History reviewed today 

## 2017-12-05 NOTE — Progress Notes (Signed)
Called to room to assist during endoscopic procedure.  Patient ID and intended procedure confirmed with present staff. Received instructions for my participation in the procedure from the performing physician.  

## 2017-12-05 NOTE — Patient Instructions (Signed)
*HANDOUTS GIVEN ON DIALATION DIET, HIATAL HERNIA, AND POLYPS. RESUME PLAVIX ON July 3.*    YOU HAD AN ENDOSCOPIC PROCEDURE TODAY AT THE [facility]:   Refer to the procedure report that was given to you for any specific questions about what was found during the examination.  If the procedure report does not answer your questions, please call your gastroenterologist to clarify.  If you requested that your care partner not be given the details of your procedure findings, then the procedure report has been included in a sealed envelope for you to review at your convenience later.  YOU SHOULD EXPECT: Some feelings of bloating in the abdomen. Passage of more gas than usual.  Walking can help get rid of the air that was put into your GI tract during the procedure and reduce the bloating. If you had a lower endoscopy (such as a colonoscopy or flexible sigmoidoscopy) you may notice spotting of blood in your stool or on the toilet paper. If you underwent a bowel prep for your procedure, you may not have a normal bowel movement for a few days.  Please Note:  You might notice some irritation and congestion in your nose or some drainage.  This is from the oxygen used during your procedure.  There is no need for concern and it should clear up in a day or so.  SYMPTOMS TO REPORT IMMEDIATELY:   Following lower endoscopy (colonoscopy or flexible sigmoidoscopy):  Excessive amounts of blood in the stool  Significant tenderness or worsening of abdominal pains  Swelling of the abdomen that is new, acute  Fever of 100F or higher   Following upper endoscopy (EGD)  Vomiting of blood or coffee ground material  New chest pain or pain under the shoulder blades  Painful or persistently difficult swallowing  New shortness of breath  Fever of 100F or higher  Black, tarry-looking stools  For urgent or emergent issues, a gastroenterologist can be reached at any hour by calling [phone].   DIET:   Nothing by mouth until 1:30, clear liquids for an hour, then soft diet the remainder of the day. Then you may proceed to your regular diet tomorrow.  Drink plenty of fluids but you should avoid alcoholic beverages for 24 hours.  ACTIVITY:  You should plan to take it easy for the rest of today and you should NOT DRIVE or use heavy machinery until tomorrow (because of the sedation medicines used during the test).    FOLLOW UP: Our staff will call the number listed on your records the next business day following your procedure to check on you and address any questions or concerns that you may have regarding the information given to you following your procedure. If we do not reach you, we will leave a message.  However, if you are feeling well and you are not experiencing any problems, there is no need to return our call.  We will assume that you have returned to your regular daily activities without incident.  If any biopsies were taken you will be contacted by phone or by letter within the next 1-3 weeks.  Please call us at [phone] if you have not heard about the biopsies in 3 weeks.    SIGNATURES/CONFIDENTIALITY: You and/or your care partner have signed paperwork which will be entered into your electronic medical record.  These signatures attest to the fact that that the information above on your After Visit Summary has been reviewed and is understood.  Full responsibility of the confidentiality of this discharge information lies with you and/or your care-partner.

## 2017-12-05 NOTE — Op Note (Signed)
[place] Patient Name: Katrina [name] Procedure Date: 12/05/2017 11:40 AM MRN: [id number] Endoscopist: Remo Lipps P. Havery Moros , MD Age: 57 Referring MD:  Date of Birth: [birth year] Gender: Female Account #: [id number] Procedure:                Colonoscopy Indications:              Abdominal pain, Chronic diarrhea, history of colon                            polyps Medicines:                Monitored Anesthesia Care Procedure:                Pre-Anesthesia Assessment:                           - Prior to the procedure, a History and Physical                            was performed, and patient medications and                            allergies were reviewed. The patient's tolerance of                            previous anesthesia was also reviewed. The risks                            and benefits of the procedure and the sedation                            options and risks were discussed with the patient.                            All questions were answered, and informed consent                            was obtained. Prior Anticoagulants: The patient has                            taken Plavix (clopidogrel), last dose was 5 days                            prior to procedure. ASA Grade Assessment: III - A                            patient with severe systemic disease. After                            reviewing the risks and benefits, the patient was                            deemed in satisfactory condition to undergo the  procedure.                           After obtaining informed consent, the colonoscope                            was passed under direct vision. Throughout the                            procedure, the patient's blood pressure, pulse, and                            oxygen saturations were monitored continuously. The                            Model PCF-H190DL [id number]) scope was introduced   through the anus and advanced to the the terminal                            ileum, with identification of the appendiceal                            orifice and IC valve. The colonoscopy was performed                            without difficulty. The patient tolerated the                            procedure well. The quality of the bowel                            preparation was adequate. The terminal ileum,                            ileocecal valve, appendiceal orifice, and rectum                            were photographed. Scope In: 11:59:43 AM Scope Out: 12:25:35 PM Scope Withdrawal Time: 0 hours 20 minutes 53 seconds  Total Procedure Duration: 0 hours 25 minutes 52 seconds  Findings:                 The perianal and digital rectal examinations were                            normal.                           The terminal ileum appeared normal.                           A 10 mm polyp was found in the ascending colon. The                            polyp was flat. The polyp was removed with a cold  snare. Resection and retrieval were complete.                           A diminutive polyp was found in the transverse                            colon. The polyp was sessile. The polyp was removed                            with a cold biopsy forceps. Resection and retrieval                            were complete.                           A 4 mm polyp was found in the descending colon. The                            polyp was sessile. The polyp was removed with a                            cold snare. Resection and retrieval were complete.                           Two sessile polyps were found in the sigmoid colon.                            The polyps were 8 mm in size. These polyps were                            removed with a cold snare. Resection and retrieval                            were complete.                           A few small-mouthed  diverticula were found in the                            recto-sigmoid colon and sigmoid colon.                           The exam was otherwise without abnormality.                           Biopsies for histology were taken with a cold                            forceps from the right colon, left colon and                            transverse colon for evaluation of microscopic  colitis. Complications:            No immediate complications. Estimated blood loss:                            Minimal. Estimated Blood Loss:     Estimated blood loss was minimal. Impression:               - The examined portion of the ileum was normal.                           - One 10 mm polyp in the ascending colon, removed                            with a cold snare. Resected and retrieved.                           - One diminutive polyp in the transverse colon,                            removed with a cold biopsy forceps. Resected and                            retrieved.                           - One 4 mm polyp in the descending colon, removed                            with a cold snare. Resected and retrieved.                           - Two 8 mm polyps in the sigmoid colon, removed                            with a cold snare. Resected and retrieved.                           - Diverticulosis in the recto-sigmoid colon and in                            the sigmoid colon.                           - The examination was otherwise normal.                           - Biopsies were taken with a cold forceps from the                            right colon, left colon and transverse colon for                            evaluation of microscopic colitis. Recommendation:           -  Patient has a contact number available for                            emergencies. The signs and symptoms of potential                            delayed complications were discussed with the                             patient. Return to normal activities tomorrow.                            Written discharge instructions were provided to the                            patient.                           - Resume previous diet.                           - Continue present medications.                           - Resume Plavix on 7/3                           - Await pathology results.                           - Repeat colonoscopy for surveillance based on                            pathology results. Remo Lipps P. Samier Jaco, MD 12/05/2017 12:31:31 PM This report has been signed electronically.

## 2017-12-05 NOTE — Op Note (Signed)
[place] Patient Name: Katrina [name] Procedure Date: 12/05/2017 11:41 AM MRN: [id number] Endoscopist: Remo Lipps P. 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 The patient's tolerance of                            previous anesthesia was also reviewed. The risks                            and benefits of the procedure and the sedation                            options and risks were discussed with the patient.                            All questions were answered, and informed consent                            was obtained. Prior Anticoagulants: The patient has                            taken Plavix (clopidogrel), last dose was 5 days                            prior to procedure. ASA Grade Assessment: III - A                            patient with severe systemic disease. After  reviewing the risks and benefits, the patient was                            deemed in satisfactory condition to undergo the                            procedure.                           After obtaining informed consent, the endoscope was                            passed under direct vision. Throughout the                            procedure, the patient's blood pressure, pulse, and                             oxygen saturations were monitored continuously. The                            Endoscope was introduced through the mouth, and                            advanced to the second part of duodenum. The upper                            GI endoscopy was accomplished without difficulty.                            The patient tolerated the procedure well. Scope In: Scope Out: Findings:                 Esophagogastric landmarks were identified: the                            Z-line was found at 39 cm, the gastroesophageal                            junction was found at 39 cm and the upper extent of                            the gastric folds was found at 40 cm from the                            incisors.                           A 1 cm hiatal hernia was present.                           One benign-appearing, intrinsic mild stenosis was                            found at the GEJ  and was subtle. This stenosis                            measured less than one cm (in length). A TTS                            dilator was passed through the scope. Dilation with                            an 18-19-20 mm balloon dilator was performed to 18                            mm, 19 mm and 20 mm without any appreciable mucosal                            wrent. It was biopsied further in 4 quarants to                            open it further..                           The exam of the esophagus was otherwise normal.                           The entire examined stomach was normal. Biopsies                            were taken with a cold forceps from the antrum,                            body, and incisura for Helicobacter pylori testing.                           The duodenal bulb and second portion of the                            duodenum were normal. Biopsies for histology were                            taken with a cold forceps for evaluation of celiac                             disease. Complications:            No immediate complications. Estimated blood loss:                            Minimal. Estimated Blood Loss:     Estimated blood loss was minimal. Impression:               - Esophagogastric landmarks identified.                           - 1 cm hiatal hernia.                           -  Benign-appearing esophageal stenosis. Dilated to                            107mm and opened with 4 quadrant biopsies.                           - Normal stomach. Biopsied.                           - Normal duodenal bulb and second portion of the                            duodenum. Biopsied. Recommendation:           - Patient has a contact number available for                            emergencies. The signs and symptoms of potential                            delayed complications were discussed with the                            patient. Return to normal activities tomorrow.                            Written discharge instructions were provided to the                            patient.                           - Resume previous diet.                           - Continue present medications.                           - Resume Plavix on 7/3 per colonoscopy note for                            polyps removed                           - Await pathology results. Remo Lipps P. Joanie Duprey, MD 12/05/2017 12:36:24 PM This report has been signed electronically.

## 2017-12-06 ENCOUNTER — Telehealth: Payer: Self-pay

## 2017-12-06 IMAGING — CT CT HEAD W/O CM
4 series · 17 of 47 positions shown, 19 images · non-contrast
Comparison: Prior CT from 04/02/2016.

CLINICAL DATA: Initial evaluation for acute headache. Status post
tPA.

EXAM:
CT HEAD WITHOUT CONTRAST
TECHNIQUE: Contiguous axial images were obtained from the base of the skull
through the vertex without intravenous contrast.

[Series 2: head without · axial · non-contrast · 0.41mm/px · z∈[-76,+44]mm · 7 of 33 slices shown, 9 images]
[im 5/33  brain]
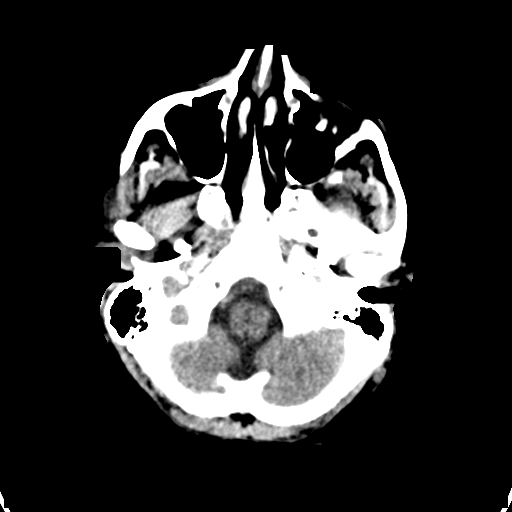
[im 5/33  bone]
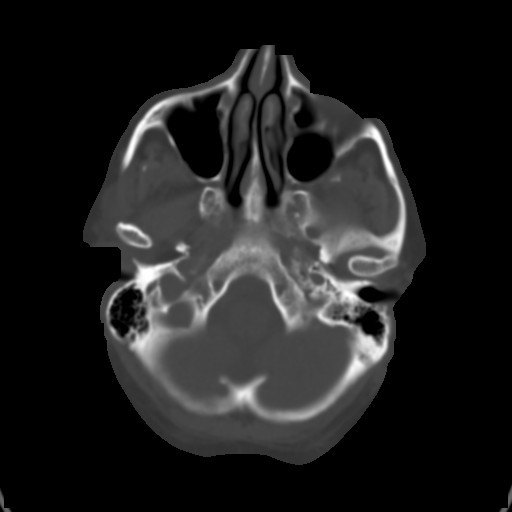
[im 9/33  brain]
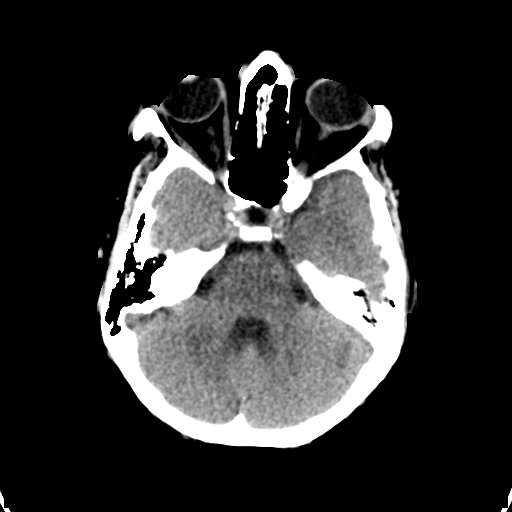
[im 13/33  brain]
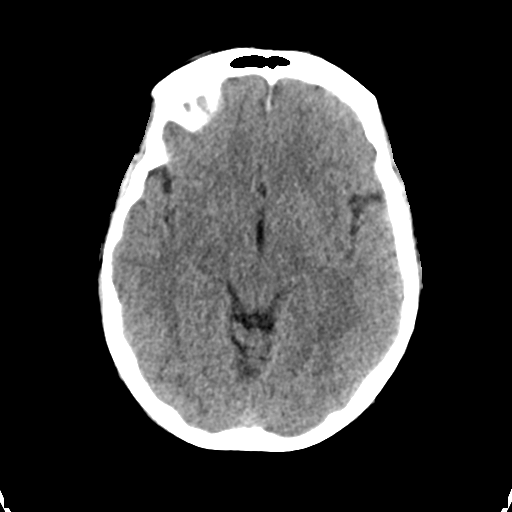
[im 17/33  brain]
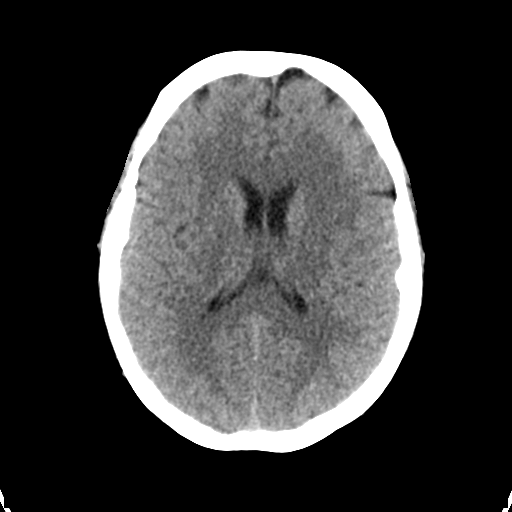
[im 21/33  brain]
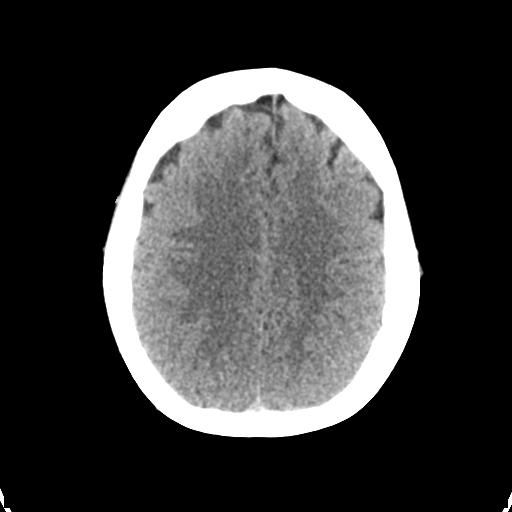
[im 21/33  bone]
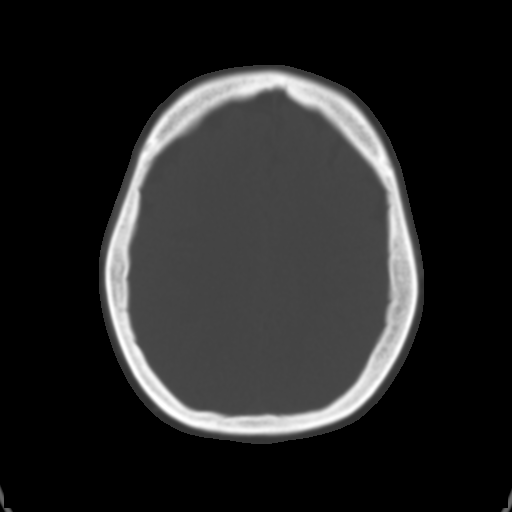
[im 25/33  brain]
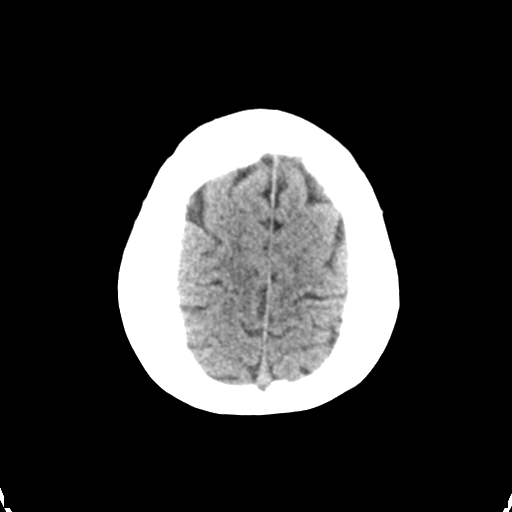
[im 29/33  brain]
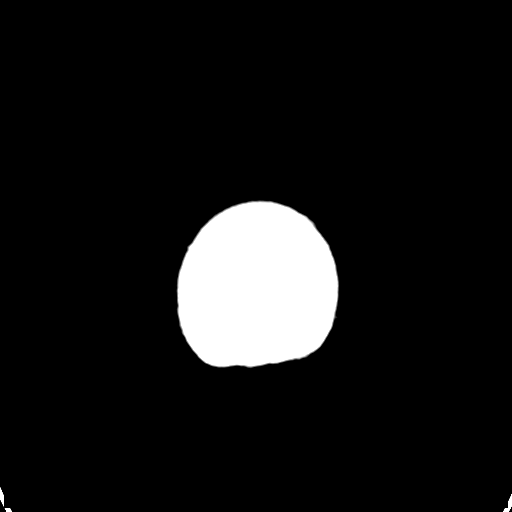

[Series 3: head bone · axial · 0.41mm/px · z∈[-80,-24]mm · 4 of 82 slices shown]
[im 9/82  bone]
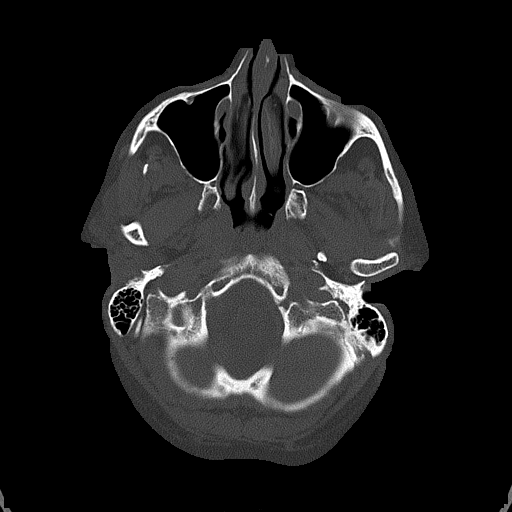
[im 17/82  bone]
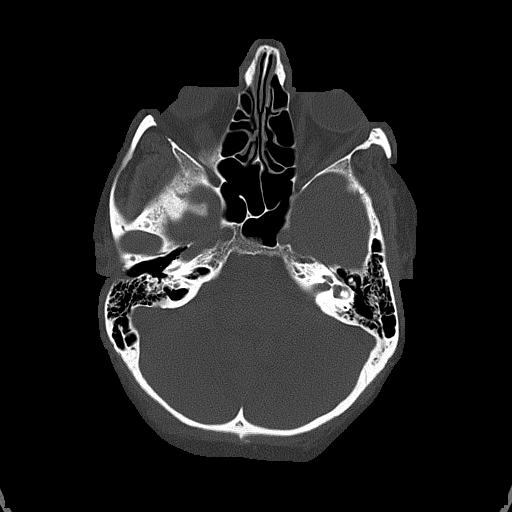
[im 25/82  bone]
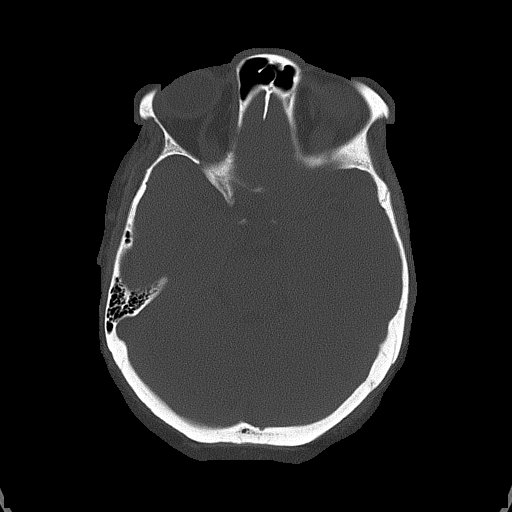
[im 37/82  bone]
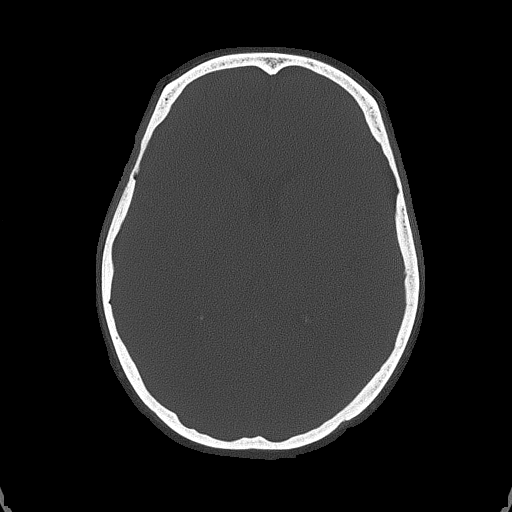

[Series 4: head without cor · coronal · non-contrast · 0.30mm/px · 3 of 63 slices shown]
[im 21/63  brain]
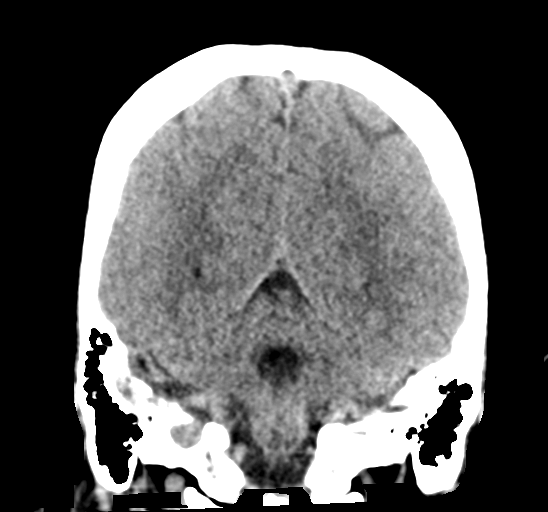
[im 28/63  brain]
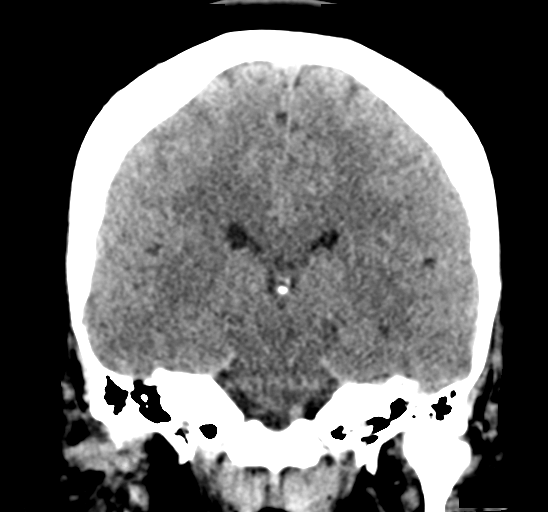
[im 35/63  brain]
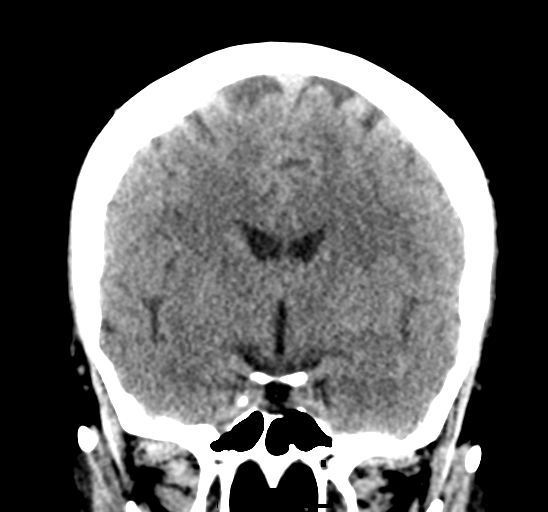

[Series 5: head without sag · sagittal · non-contrast · 0.34mm/px · 3 of 49 slices shown]
[im 17/49  brain]
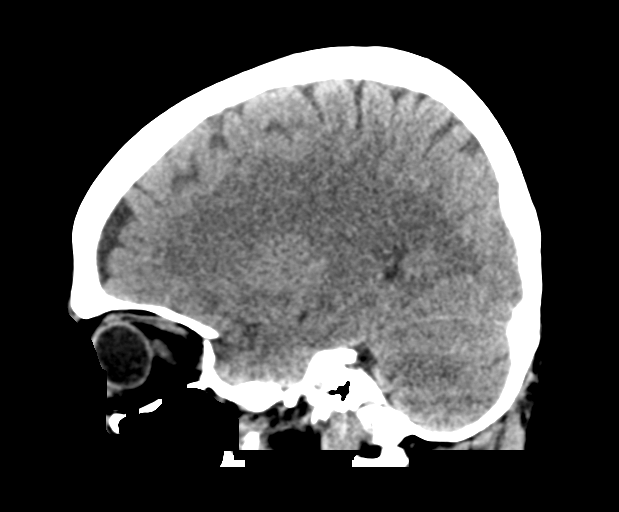
[im 25/49  brain]
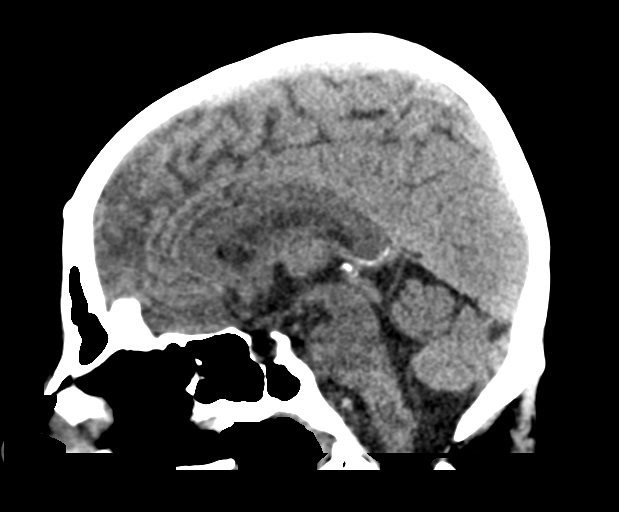
[im 33/49  brain]
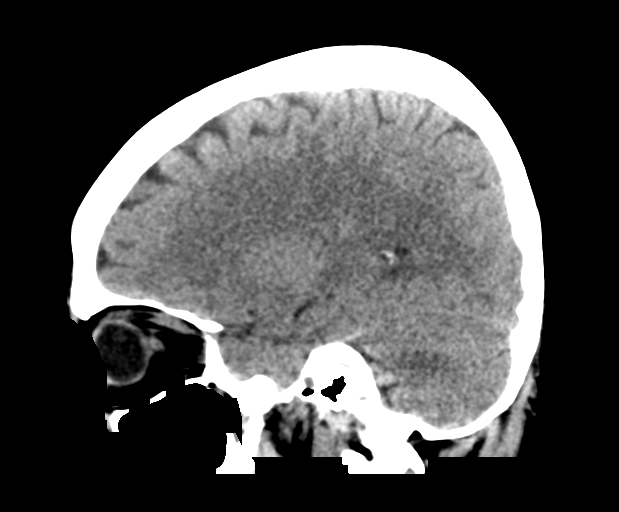

[17 of 47 positions shown; findings below may reference images not displayed]

FINDINGS: Brain: Stable cerebral volume. No acute intracranial hemorrhage.
Gray-white matter differentiation maintained. No evidence for acute
large vessel territory infarct. No mass lesion, midline shift or
mass effect. No hydrocephalus. No extra-axial fluid collection.

Vascular: No hyperdense vessel.

Skull: Scalp soft tissues demonstrate no acute abnormality.
Calvarium intact.

Sinuses/Orbits: Globes and orbits within normal limits. Paranasal
sinuses and mastoid air cells are clear.
IMPRESSION: No acute intracranial process identified. No evidence for hemorrhage
status post tPA administration.

## 2017-12-06 NOTE — Telephone Encounter (Signed)
  Follow up Call-  Call back number 12/05/2017 10/20/2016  Post procedure Call Back phone  # [phone]  Permission to leave phone message Yes Yes  Some recent data might be hidden     Patient questions:  Do you have a fever, pain , or abdominal swelling? No. Pain Score  0 *  Have you tolerated food without any problems? Yes.    Have you been able to return to your normal activities? Yes.    Do you have any questions about your discharge instructions: Diet   No. Medications  No. Follow up visit  No.  Do you have questions or concerns about your Care? No.  Actions: * If pain score is 4 or above: No action needed, pain <4.  Patient states that she has had some nausea since yesterday.  States she is swallowing better and only ate some jello after her procedures yesterday.  Advised to try soft diet this morning with something more substantial than jello to see if food in her stomach would ease the nausea.  Pt. Agreed and will call back if any further problems.

## 2017-12-07 ENCOUNTER — Ambulatory Visit (INDEPENDENT_AMBULATORY_CARE_PROVIDER_SITE_OTHER): Payer: Medicare Other | Admitting: Psychiatry

## 2017-12-07 ENCOUNTER — Encounter (HOSPITAL_COMMUNITY): Payer: Self-pay | Admitting: Psychiatry

## 2017-12-07 ENCOUNTER — Other Ambulatory Visit (HOSPITAL_COMMUNITY): Payer: Self-pay | Admitting: Psychiatry

## 2017-12-07 VITALS — BP 192/130 | HR 87 | Ht 66.0 in | Wt 216.0 lb

## 2017-12-07 DIAGNOSIS — G4700 Insomnia, unspecified: Secondary | ICD-10-CM

## 2017-12-07 DIAGNOSIS — F329 Major depressive disorder, single episode, unspecified: Secondary | ICD-10-CM | POA: Diagnosis not present

## 2017-12-07 DIAGNOSIS — Z818 Family history of other mental and behavioral disorders: Secondary | ICD-10-CM | POA: Diagnosis not present

## 2017-12-07 DIAGNOSIS — F4310 Post-traumatic stress disorder, unspecified: Secondary | ICD-10-CM | POA: Diagnosis not present

## 2017-12-07 DIAGNOSIS — F17210 Nicotine dependence, cigarettes, uncomplicated: Secondary | ICD-10-CM | POA: Diagnosis not present

## 2017-12-07 IMAGING — CT CT HEAD W/O CM
4 series · 17 of 47 positions shown, 19 images · non-contrast
Comparison: Prior CTs from 04/02/2016 as well as 04/03/2016.

CLINICAL DATA: Initial evaluation for stroke.  Left-sided weakness.

EXAM:
CT HEAD WITHOUT CONTRAST
TECHNIQUE: Contiguous axial images were obtained from the base of the skull
through the vertex without intravenous contrast.

[Series 2: head without · axial · non-contrast · 0.41mm/px · z∈[-83,+37]mm · 7 of 33 slices shown, 9 images]
[im 5/33  brain]
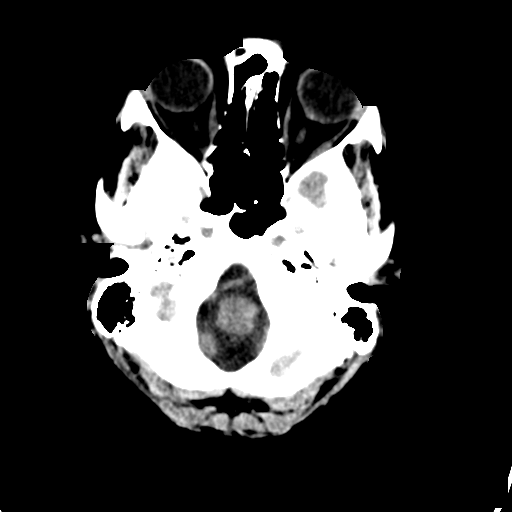
[im 5/33  bone]
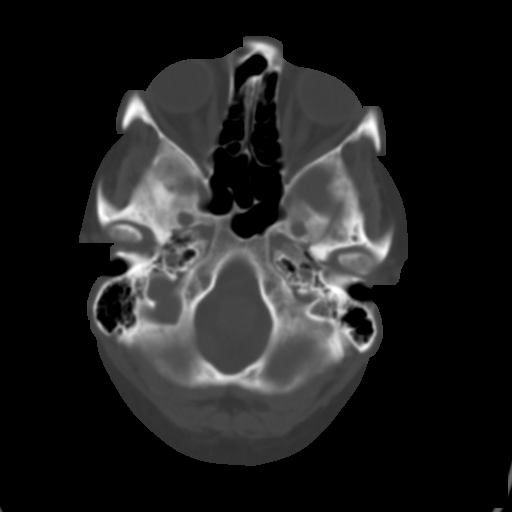
[im 9/33  brain]
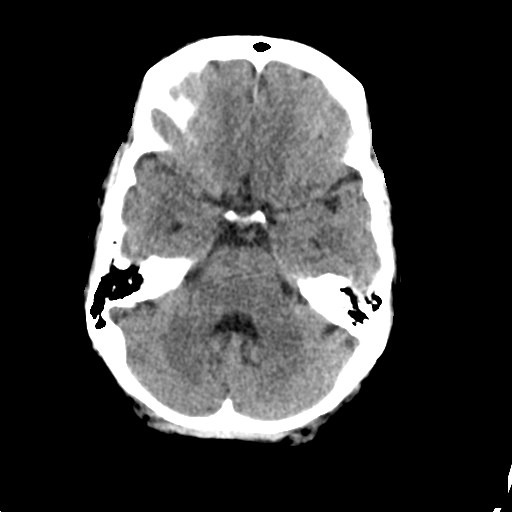
[im 13/33  brain]
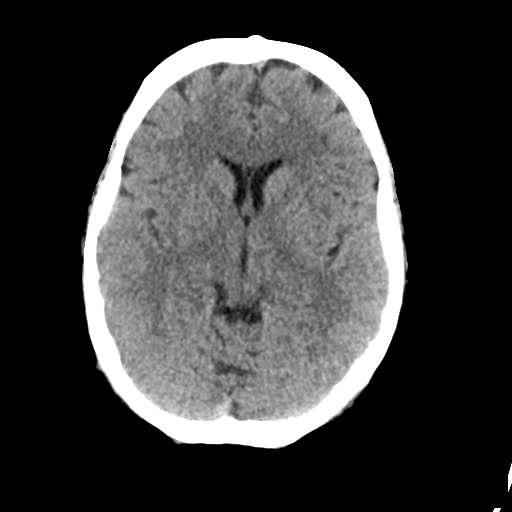
[im 17/33  brain]
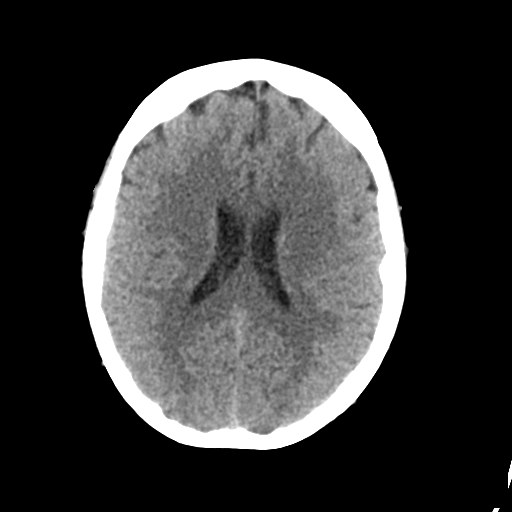
[im 21/33  brain]
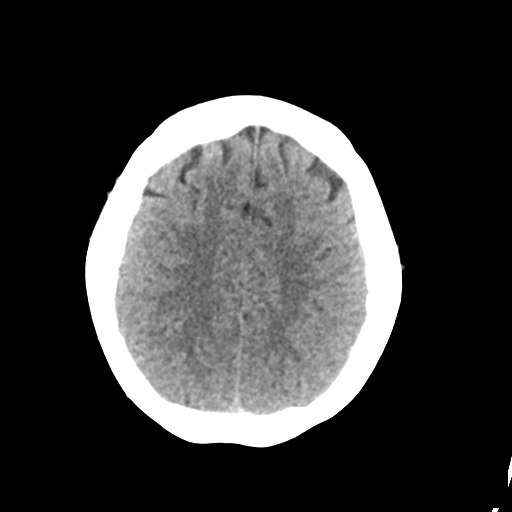
[im 21/33  bone]
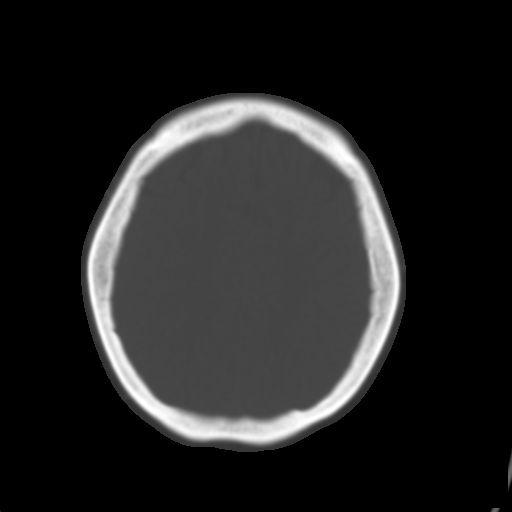
[im 25/33  brain]
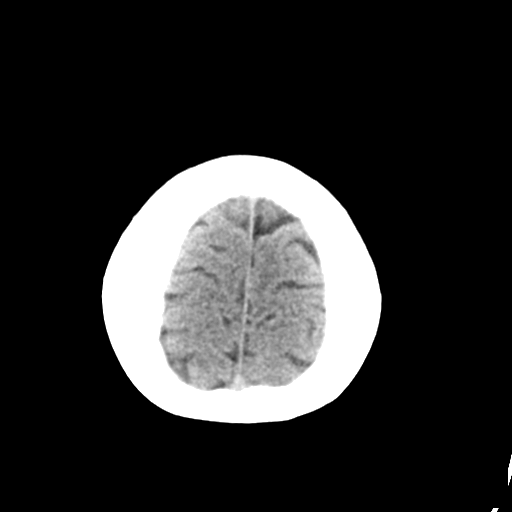
[im 29/33  brain]
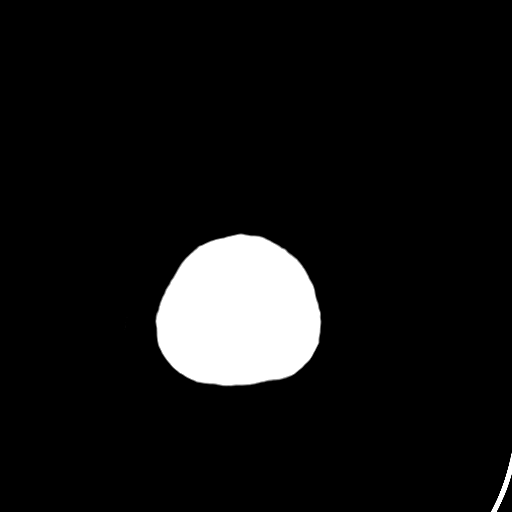

[Series 3: head bone · axial · 0.41mm/px · z∈[-87,-31]mm · 4 of 82 slices shown]
[im 9/82  bone]
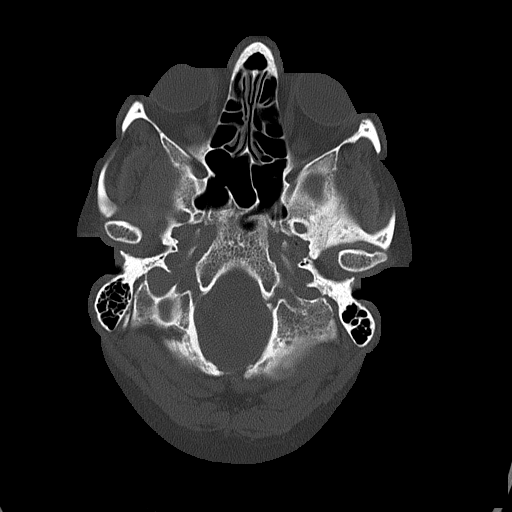
[im 17/82  bone]
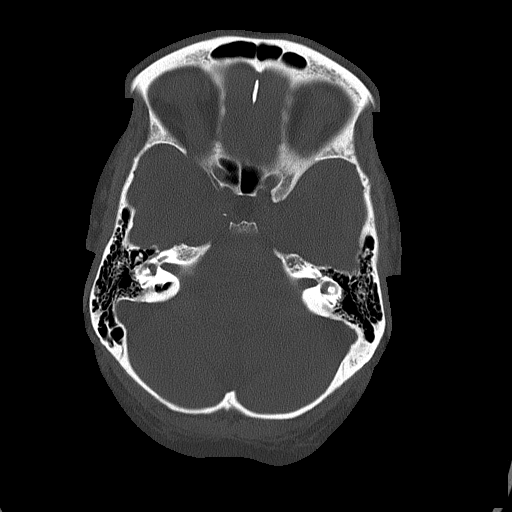
[im 25/82  bone]
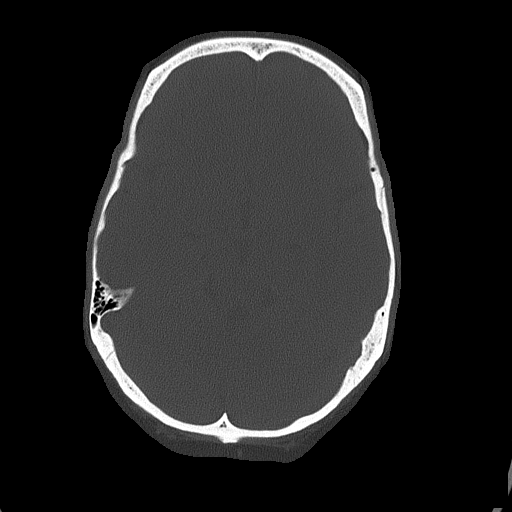
[im 37/82  bone]
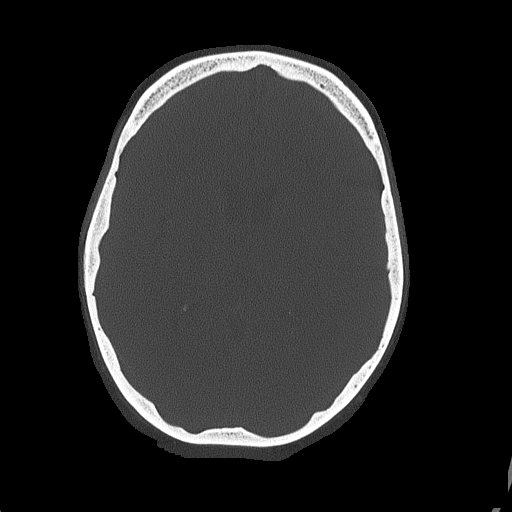

[Series 4: head without cor · coronal · non-contrast · 0.32mm/px · 3 of 65 slices shown]
[im 22/65  brain]
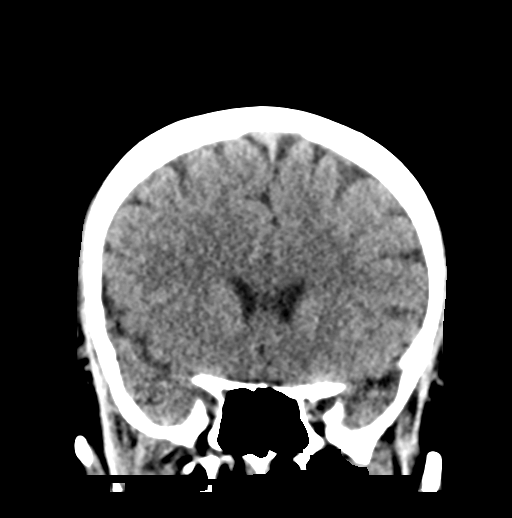
[im 29/65  brain]
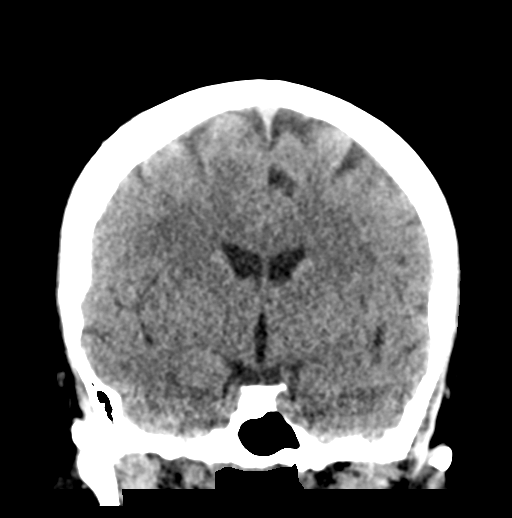
[im 36/65  brain]
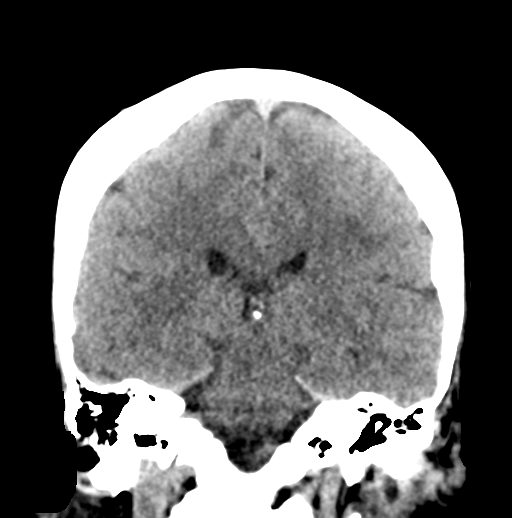

[Series 5: head without sag · sagittal · non-contrast · 0.35mm/px · 3 of 67 slices shown]
[im 23/67  brain]
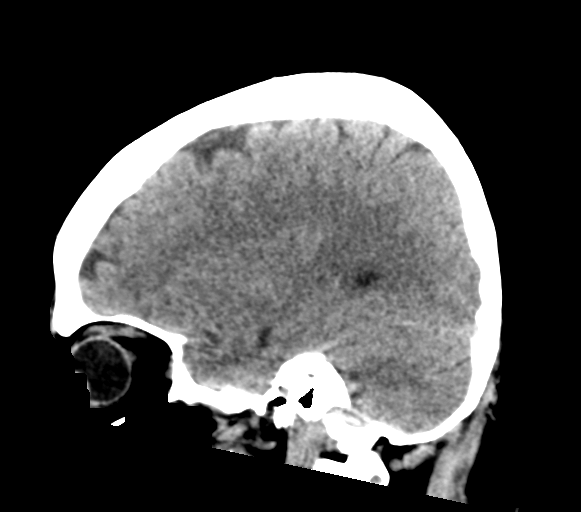
[im 34/67  brain]
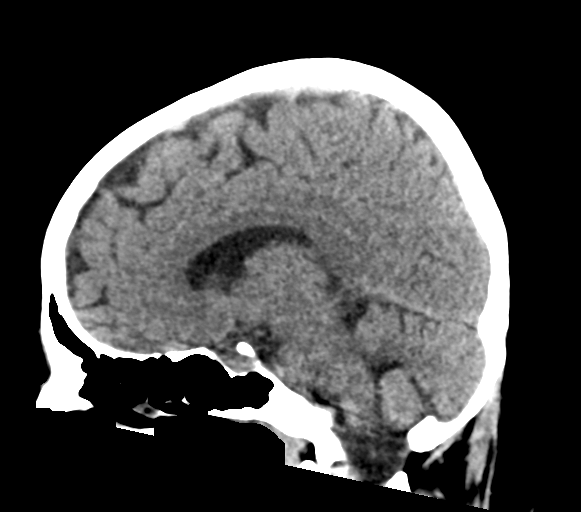
[im 45/67  brain]
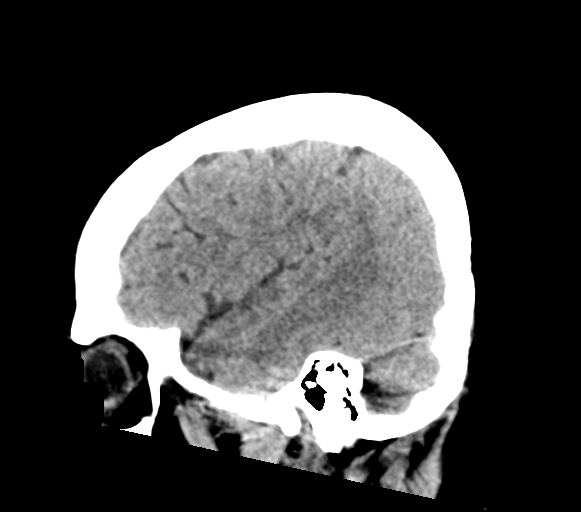

[17 of 47 positions shown; findings below may reference images not displayed]

FINDINGS: Brain: Cerebral volume stable. Mild cerebral white matter disease
noted, stable. No acute intracranial hemorrhage. Gray-white matter
differentiation maintained. No findings to suggest evolving
ischemia. No mass lesion, midline shift or mass effect. No
hydrocephalus. No extra-axial fluid collection.

Vascular: No hyperdense vessel.

Skull: Stable unremarkable scalp soft tissues and calvarium.

Sinuses/Orbits: Globes and orbital soft tissues within normal
limits. Paranasal sinuses and mastoid air cells are clear.
IMPRESSION: 1. Stable appearance of the brain. No acute intracranial process
identified. No evidence of hemorrhage status post tPA
administration. No evidence for evolving ischemia.
2. Stable mild nonspecific cerebral white matter disease.

## 2017-12-07 MED ORDER — ESCITALOPRAM OXALATE 10 MG PO TABS: 10.0000 mg | ORAL_TABLET | Freq: Every day | ORAL | 0 refills | Status: DC

## 2017-12-07 MED ORDER — ESCITALOPRAM OXALATE 10 MG PO TABS: 10.0000 mg | ORAL_TABLET | Freq: Every day | ORAL | 1 refills | Status: DC

## 2017-12-07 MED ORDER — ARIPIPRAZOLE 2 MG PO TABS: 2.0000 mg | ORAL_TABLET | Freq: Every day | ORAL | 1 refills | Status: DC

## 2017-12-07 NOTE — Patient Instructions (Addendum)
1. Continue lexapro 10 mg daily  2. Start Abilify 2 mg daily  3. Discuss use of prazosin with your primary care 4. Return to clinic in two months for 30 mins

## 2017-12-20 ENCOUNTER — Encounter (HOSPITAL_COMMUNITY): Payer: Self-pay

## 2017-12-20 ENCOUNTER — Observation Stay (HOSPITAL_COMMUNITY)
Admission: EM | Admit: 2017-12-20 | Discharge: 2017-12-22 | Disposition: A | Discharge status: Home / Self Care | Payer: Medicare Other | Attending: Internal Medicine | Admitting: Internal Medicine

## 2017-12-20 ENCOUNTER — Emergency Department (HOSPITAL_COMMUNITY): Payer: Medicare Other

## 2017-12-20 ENCOUNTER — Encounter (HOSPITAL_COMMUNITY): Payer: Self-pay | Admitting: Emergency Medicine

## 2017-12-20 ENCOUNTER — Other Ambulatory Visit: Payer: Self-pay

## 2017-12-20 ENCOUNTER — Ambulatory Visit (INDEPENDENT_AMBULATORY_CARE_PROVIDER_SITE_OTHER)
Admission: EM | Admit: 2017-12-20 | Discharge: 2017-12-20 | Disposition: A | Discharge status: Home / Self Care | Payer: Medicare Other | Source: Home / Self Care | Attending: Family Medicine | Admitting: Family Medicine

## 2017-12-20 ENCOUNTER — Observation Stay (HOSPITAL_COMMUNITY): Payer: Medicare Other

## 2017-12-20 DIAGNOSIS — G4700 Insomnia, unspecified: Secondary | ICD-10-CM | POA: Diagnosis not present

## 2017-12-20 DIAGNOSIS — R0789 Other chest pain: Secondary | ICD-10-CM

## 2017-12-20 DIAGNOSIS — O26899 Other specified pregnancy related conditions, unspecified trimester: Secondary | ICD-10-CM | POA: Diagnosis present

## 2017-12-20 DIAGNOSIS — R29810 Facial weakness: Secondary | ICD-10-CM

## 2017-12-20 DIAGNOSIS — N189 Chronic kidney disease, unspecified: Secondary | ICD-10-CM | POA: Insufficient documentation

## 2017-12-20 DIAGNOSIS — F17210 Nicotine dependence, cigarettes, uncomplicated: Secondary | ICD-10-CM | POA: Insufficient documentation

## 2017-12-20 DIAGNOSIS — R109 Unspecified abdominal pain: Secondary | ICD-10-CM | POA: Diagnosis not present

## 2017-12-20 DIAGNOSIS — R51 Headache: Secondary | ICD-10-CM

## 2017-12-20 DIAGNOSIS — K219 Gastro-esophageal reflux disease without esophagitis: Secondary | ICD-10-CM | POA: Diagnosis not present

## 2017-12-20 DIAGNOSIS — I69398 Other sequelae of cerebral infarction: Secondary | ICD-10-CM | POA: Insufficient documentation

## 2017-12-20 DIAGNOSIS — Z6839 Body mass index (BMI) 39.0-39.9, adult: Secondary | ICD-10-CM | POA: Diagnosis not present

## 2017-12-20 DIAGNOSIS — I129 Hypertensive chronic kidney disease with stage 1 through stage 4 chronic kidney disease, or unspecified chronic kidney disease: Secondary | ICD-10-CM | POA: Diagnosis not present

## 2017-12-20 DIAGNOSIS — R319 Hematuria, unspecified: Secondary | ICD-10-CM | POA: Diagnosis not present

## 2017-12-20 DIAGNOSIS — Z8614 Personal history of Methicillin resistant Staphylococcus aureus infection: Secondary | ICD-10-CM | POA: Insufficient documentation

## 2017-12-20 DIAGNOSIS — Z7902 Long term (current) use of antithrombotics/antiplatelets: Secondary | ICD-10-CM | POA: Diagnosis not present

## 2017-12-20 DIAGNOSIS — Z9119 Patient's noncompliance with other medical treatment and regimen: Secondary | ICD-10-CM | POA: Diagnosis not present

## 2017-12-20 DIAGNOSIS — H5462 Unqualified visual loss, left eye, normal vision right eye: Secondary | ICD-10-CM | POA: Diagnosis not present

## 2017-12-20 DIAGNOSIS — I701 Atherosclerosis of renal artery: Secondary | ICD-10-CM | POA: Diagnosis not present

## 2017-12-20 DIAGNOSIS — Z79899 Other long term (current) drug therapy: Secondary | ICD-10-CM | POA: Diagnosis not present

## 2017-12-20 DIAGNOSIS — R072 Precordial pain: Secondary | ICD-10-CM

## 2017-12-20 DIAGNOSIS — G35 Multiple sclerosis: Secondary | ICD-10-CM | POA: Diagnosis not present

## 2017-12-20 DIAGNOSIS — E1122 Type 2 diabetes mellitus with diabetic chronic kidney disease: Secondary | ICD-10-CM | POA: Insufficient documentation

## 2017-12-20 DIAGNOSIS — I161 Hypertensive emergency: Principal | ICD-10-CM | POA: Insufficient documentation

## 2017-12-20 DIAGNOSIS — E669 Obesity, unspecified: Secondary | ICD-10-CM | POA: Diagnosis not present

## 2017-12-20 DIAGNOSIS — R079 Chest pain, unspecified: Secondary | ICD-10-CM | POA: Insufficient documentation

## 2017-12-20 DIAGNOSIS — I6930 Unspecified sequelae of cerebral infarction: Secondary | ICD-10-CM

## 2017-12-20 DIAGNOSIS — Z7951 Long term (current) use of inhaled steroids: Secondary | ICD-10-CM | POA: Insufficient documentation

## 2017-12-20 DIAGNOSIS — R1032 Left lower quadrant pain: Secondary | ICD-10-CM | POA: Diagnosis not present

## 2017-12-20 DIAGNOSIS — I69354 Hemiplegia and hemiparesis following cerebral infarction affecting left non-dominant side: Secondary | ICD-10-CM | POA: Insufficient documentation

## 2017-12-20 DIAGNOSIS — Z720 Tobacco use: Secondary | ICD-10-CM | POA: Diagnosis not present

## 2017-12-20 DIAGNOSIS — J449 Chronic obstructive pulmonary disease, unspecified: Secondary | ICD-10-CM | POA: Diagnosis present

## 2017-12-20 DIAGNOSIS — F4310 Post-traumatic stress disorder, unspecified: Secondary | ICD-10-CM | POA: Diagnosis not present

## 2017-12-20 DIAGNOSIS — G8929 Other chronic pain: Secondary | ICD-10-CM | POA: Diagnosis not present

## 2017-12-20 DIAGNOSIS — R05 Cough: Secondary | ICD-10-CM | POA: Diagnosis present

## 2017-12-20 LAB — I-STAT TROPONIN, ED: Troponin i, poc: 0 ng/mL (ref 0.00–0.08)

## 2017-12-20 LAB — CBC
HEMATOCRIT: 41.2 % (ref 36.0–46.0)
Hemoglobin: 13.3 g/dL (ref 12.0–15.0)
MCH: 29 pg (ref 26.0–34.0)
MCHC: 32.3 g/dL (ref 30.0–36.0)
MCV: 89.8 fL (ref 78.0–100.0)
Platelets: 244 10*3/uL (ref 150–400)
RBC: 4.59 MIL/uL (ref 3.87–5.11)
RDW: 14.6 % (ref 11.5–15.5)
WBC: 9.3 10*3/uL (ref 4.0–10.5)

## 2017-12-20 LAB — BASIC METABOLIC PANEL
Anion gap: 9 (ref 5–15)
BUN: 13 mg/dL (ref 6–20)
CHLORIDE: 111 mmol/L (ref 98–111)
CO2: 22 mmol/L (ref 22–32)
Calcium: 9.1 mg/dL (ref 8.9–10.3)
Creatinine, Ser: 0.87 mg/dL (ref 0.44–1.00)
Glucose, Bld: 92 mg/dL (ref 70–99)
POTASSIUM: 3.8 mmol/L (ref 3.5–5.1)
SODIUM: 142 mmol/L (ref 135–145)

## 2017-12-20 LAB — I-STAT BETA HCG BLOOD, ED (MC, WL, AP ONLY): HCG, QUANTITATIVE: 11.6 m[IU]/mL — AB (ref <5)

## 2017-12-20 LAB — HEPATIC FUNCTION PANEL
ALT: 14 U/L (ref 0–44)
AST: 15 U/L (ref 15–41)
Albumin: 3.6 g/dL (ref 3.5–5.0)
Alkaline Phosphatase: 67 U/L (ref 38–126)
BILIRUBIN TOTAL: 0.4 mg/dL (ref 0.3–1.2)
Bilirubin, Direct: 0.1 mg/dL (ref 0.0–0.2)
Indirect Bilirubin: 0.3 mg/dL (ref 0.3–0.9)
Total Protein: 6.7 g/dL (ref 6.5–8.1)

## 2017-12-20 LAB — POCT URINALYSIS DIP (DEVICE)
BILIRUBIN URINE: NEGATIVE
Glucose, UA: NEGATIVE mg/dL
Ketones, ur: NEGATIVE mg/dL
LEUKOCYTES UA: NEGATIVE
NITRITE: NEGATIVE
Protein, ur: NEGATIVE mg/dL
SPECIFIC GRAVITY, URINE: 1.025 (ref 1.005–1.030)
Urobilinogen, UA: 0.2 mg/dL (ref 0.0–1.0)
pH: 6 (ref 5.0–8.0)

## 2017-12-20 MED ORDER — HYDRALAZINE HCL 20 MG/ML IJ SOLN: 10.0000 mg | INTRAMUSCULAR | Status: DC | PRN

## 2017-12-20 MED ORDER — HYDRALAZINE HCL 20 MG/ML IJ SOLN
10.0000 mg | Freq: Once | INTRAMUSCULAR | Status: AC
  Administered 2017-12-20: 10 mg via INTRAVENOUS
  Filled 2017-12-20: qty 1

## 2017-12-20 MED ORDER — METOCLOPRAMIDE HCL 5 MG/ML IJ SOLN: 10.0000 mg | Freq: Once | INTRAMUSCULAR | Status: DC

## 2017-12-20 MED ORDER — ONDANSETRON HCL 4 MG/2ML IJ SOLN
4.0000 mg | Freq: Once | INTRAMUSCULAR | Status: AC
  Administered 2017-12-20: 4 mg via INTRAVENOUS
  Filled 2017-12-20: qty 2

## 2017-12-20 MED ORDER — ONDANSETRON HCL 4 MG PO TABS: 4.0000 mg | ORAL_TABLET | Freq: Four times a day (QID) | ORAL | Status: DC | PRN

## 2017-12-20 MED ORDER — NICOTINE 21 MG/24HR TD PT24
21.0000 mg | MEDICATED_PATCH | Freq: Once | TRANSDERMAL | Status: AC
  Administered 2017-12-20: 21 mg via TRANSDERMAL
  Filled 2017-12-20: qty 1

## 2017-12-20 MED ORDER — ONDANSETRON HCL 4 MG/2ML IJ SOLN
4.0000 mg | Freq: Four times a day (QID) | INTRAMUSCULAR | Status: DC | PRN
Start: 2017-12-20 — End: 2017-12-22

## 2017-12-20 MED ORDER — GUAIFENESIN ER 600 MG PO TB12
600.0000 mg | ORAL_TABLET | Freq: Two times a day (BID) | ORAL | Status: DC
  Filled 2017-12-20 (×4): qty 1

## 2017-12-20 MED ORDER — ALBUTEROL SULFATE (2.5 MG/3ML) 0.083% IN NEBU: 2.5000 mg | INHALATION_SOLUTION | RESPIRATORY_TRACT | Status: DC | PRN

## 2017-12-20 MED ORDER — HYDROMORPHONE HCL 1 MG/ML IJ SOLN
0.5000 mg | Freq: Once | INTRAMUSCULAR | Status: AC
  Administered 2017-12-20: 0.5 mg via INTRAVENOUS
  Filled 2017-12-20: qty 1

## 2017-12-20 MED ORDER — ACETAMINOPHEN 650 MG RE SUPP: 650.0000 mg | Freq: Four times a day (QID) | RECTAL | Status: DC | PRN

## 2017-12-20 MED ORDER — ACETAMINOPHEN 325 MG PO TABS
650.0000 mg | ORAL_TABLET | Freq: Four times a day (QID) | ORAL | Status: DC | PRN
  Filled 2017-12-20: qty 2

## 2017-12-20 MED ORDER — ENOXAPARIN SODIUM 40 MG/0.4ML ~~LOC~~ SOLN
40.0000 mg | SUBCUTANEOUS | Status: DC
  Filled 2017-12-20 (×2): qty 0.4

## 2017-12-20 NOTE — Progress Notes (Signed)
Pt refuses cardiac monitoring until after she is done eating.

## 2017-12-20 NOTE — ED Provider Notes (Addendum)
[facility] EMERGENCY DEPARTMENT Provider Note   CSN: [id number] Arrival date & time: 12/20/17  1421     History   Chief Complaint Chief Complaint  Patient presents with  . Hypertension  . Chest Pain    HPI Katrina [name] is a 57 y.o. female.  Patient with history of CVA, hypertension on multiple medications, US renal suggestive of renal artery stenosis in 3/18, COPD, diabetes, multiple sclerosis, CKD --presents from urgent care today with complaint of headache, chest pain, elevated blood pressures.  Patient had admission in 08/2017 for similar symptoms.  She was maintained on her home medications and treated with IV hydralazine in the hospital.  On last hospitalization, cardiology had seen patient and felt that chest pain was unrelated to ACS.  Patient had endoscopy performed and had a dilation at that time.  Patient states that she went to urgent care today because of shortness of breath with cough over the past 1 week worsening over the past 3 days.  Also with left lower quadrant abdominal pain that was new for her.  Provider there detected some left-sided facial droop which patient states is unusual.  She states that she has weakness on her left side and a dull sensation on her left face.  She reports left-sided chest pain which is severe described as a stabbing type of pain.  She also has a dull nonradiating left lower quadrant abdominal pain.  She reports nausea but no vomiting.  No diarrhea.  She has not urinated today or had any recent urinary tract infection symptoms.  She denies seeing blood in her stool having dark tarry stools.  Denies any difficulty walking.      Past Medical History:  Diagnosis Date  . Allergy   . Anemia 1975-1976   . Anxiety    takes Valium daily as needed  . Arthritis    "spine" (12/03/2016)  . Asthma    has inhalers but doesn't use (12/03/2016)  . Chronic bronchitis ([place])    "get it a couple times q yr" (12/03/2016)  . Chronic  kidney disease   . Chronic lower back pain    budlging disc   . Claustrophobia   . COPD (chronic obstructive pulmonary disease) ([place])   . Daily headache   . Depression   . Diverticulitis   . Family history of adverse reaction to anesthesia    2 daughters gets extremely sick   . GERD (gastroesophageal reflux disease)    takes Dexilant daily  . Heart murmur   . History of blood transfusion 1975-1976 "several"   no abnormal reaction noted  . History of colitis   . History of colon polyps    benign  . History of gastric ulcer   . History of hiatal hernia    "small one"  . History of MRSA infection 2017  . Hyperlipidemia    was on meds but has been off over a yr  . Hypertension    takes Amlodipine and Maxzide daily  . Insomnia    takes Ambien nightly  . Iron deficiency anemia    "when I was young"  . Lung nodules   . Migraine    "2-3/wk" (12/03/2016)  . MS (multiple sclerosis) ([place])    questionable per pt  . Noncompliance   . Osteoporosis   . Peripheral neuropathy    weakness,numbness,and tingling. Takes Gabapentin daily  . PTSD (post-traumatic stress disorder) dx'd 2016/2017   "was dx'd w/bipolar in 1991; replaced w/PTSD dx  2016/2017" (12/03/2016)  . Restless leg syndrome    takes Requip daily  . 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 Type II diabetes mellitus ([place])    "went off insulin in 2012/2013" (12/03/2016)    Patient Active Problem List   Diagnosis Date Noted  . Chest pain in adult 08/20/2017  . Abnormal brain MRI 08/08/2017  . Weakness of left leg 05/19/2017  . Hypertension 12/03/2016  . Adverse food reaction 10/14/2016  . Pulmonary nodules 10/14/2016  . Non-seasonal allergic rhinitis due to fungal spores 10/14/2016  . Cigarette smoker 09/24/2016  . Cerebral microvascular disease 08/19/2016  . Non compliance with medical treatment 05/11/2016  . Leg pain, anterior, right  05/11/2016  . Syncope 05/02/2016  . Type 2 diabetes mellitus with diabetic neuropathy ([place]) 05/02/2016  . Restless leg syndrome 05/02/2016  . History of CVA with residual deficit   . Vascular headache   . History of chest pain   . Generalized anxiety disorder   . Chronic bilateral low back pain without sciatica   . Peripheral neuropathy   . Migraine without status migrainosus, not intractable   . PTSD (post-traumatic stress disorder)   . History of cerebrovascular accident (CVA) with residual deficit   . CVA (cerebral vascular accident) ([place]) 04/03/2016  . Stroke (cerebrum) ([place]) 04/03/2016  . Post traumatic stress disorder 10/06/2015  . Precordial pain   . Migraine syndrome 01/24/2015  . Migraine 01/24/2015  . TIA (transient ischemic attack) 12/25/2014  . Hypertensive urgency   . Atypical chest pain   . Malignant hypertension   . Hypokalemia 04/16/2014  . Muscle weakness (generalized) 04/05/2014  . Stiffness of left hip joint 04/05/2014  . Pain in left hip 04/05/2014  . Diastolic dysfunction 47/42/5956  . Normal coronary arteries 03/20/2014  . PUD (peptic ulcer disease) 03/20/2014  . Type 2 diabetes, uncontrolled, with neuropathy ([place]) 03/20/2014  . Morbid obesity due to excess calories ([place]) 03/20/2014  . Left-sided weakness 03/20/2014  . Paresthesias 03/20/2014  . Cerebrovascular disease 03/20/2014  . Dyspnea on exertion 03/05/2012  . Tobacco abuse 03/05/2012  . Moderate COPD (chronic obstructive pulmonary disease) ([place]) 11/23/2011  . GERD (gastroesophageal reflux disease) 11/23/2011  . Uncontrolled hypertension 11/23/2011    Past Surgical History:  Procedure Laterality Date  . ABDOMINAL AORTOGRAM N/A 12/03/2016   Procedure: Abdominal Aortogram;  Surgeon: Angelia Mould, MD;  Location: [facility];  Service: Cardiovascular;  Laterality: N/A;  . ABDOMINAL HYSTERECTOMY  12/1987  . ADENOIDECTOMY  1975  . ANKLE SURGERY Bilateral 1993; 1995 X2    "stabilzation done; 1 on the right; 2 on the left"  . APPENDECTOMY  1989  . BREAST EXCISIONAL BIOPSY Left   . BREAST LUMPECTOMY Left    "benign tumor"  . CARDIAC CATHETERIZATION N/A 01/31/2015   Procedure: Left Heart Cath and Coronary Angiography;  Surgeon: Burnell Blanks, MD;  Location: [facility];  Service: Cardiovascular;  Laterality: N/A;  . COLONOSCOPY    . DILATION AND CURETTAGE OF UTERUS    . ESOPHAGOGASTRODUODENOSCOPY    . LAPAROSCOPIC CHOLECYSTECTOMY  1996  . NASAL RECONSTRUCTION  1976  . NASAL SINUS SURGERY  1975  . POSTERIOR LUMBAR FUSION  2005  . RADIOLOGY WITH ANESTHESIA N/A 01/15/2016   Procedure: MRI LUMBAR SPINE WITHOUT;  Surgeon: Medication Radiologist, MD;  Location: [facility];  Service: Radiology;  Laterality: N/A;  . RADIOLOGY WITH ANESTHESIA N/A  06/29/2016   Procedure: MRI OF THE BRAIN WITH AND WITHOUT;  Surgeon: Medication Radiologist, MD;  Location: [facility];  Service: Radiology;  Laterality: N/A;  . RENAL ANGIOGRAPHY N/A 12/03/2016   Procedure: Renal Angiography;  Surgeon: Angelia Mould, MD;  Location: [facility];  Service: Cardiovascular;  Laterality: N/A;  . TONSILLECTOMY  1992  . TUBAL LIGATION    . TUMOR EXCISION  1998   from back of skull; developed;  MRSA from the area that had to be packed     OB History   None      Home Medications    Prior to Admission medications   Medication Sig Start Date End Date Taking? Authorizing Provider  amLODipine (NORVASC) 10 MG tablet Take 1 tablet (10 mg total) by mouth daily. 06/05/14   Herminio Commons, MD  ARIPiprazole (ABILIFY) 2 MG tablet Take 1 tablet (2 mg total) by mouth daily. 12/07/17   Norman Clay, MD  aspirin-acetaminophen-caffeine (EXCEDRIN EXTRA STRENGTH) [phone] MG tablet Take 2 tablets by mouth daily as needed for headache or migraine.     [provider]  chlorthalidone (HYGROTON) 25 MG tablet Take 25 mg by mouth 3 (three) times daily.    [provider]  clopidogrel (PLAVIX) 75 MG tablet Take 75 mg by mouth daily.     [provider]  dexlansoprazole (DEXILANT) 60 MG capsule Take 1 tab by mouth every morning. Patient taking differently: Take 60 mg by mouth every evening.  10/18/16   Esterwood, Amy S, PA-C  doxycycline (VIBRAMYCIN) 100 MG capsule Take 100 mg by mouth 2 (two) times daily. For 10 days 12/15/17 12/25/17  [provider]  EPINEPHrine (EPIPEN 2-PAK) 0.3 mg/0.3 mL IJ SOAJ injection Inject into the muscle.    [provider]  escitalopram (LEXAPRO) 10 MG tablet Take 1 tablet (10 mg total) by mouth daily. S 12/07/17   Norman Clay, MD  FLOVENT HFA 220 MCG/ACT inhaler Inhale 2 puffs into the lungs 2 (two) times daily. 08/17/17   [provider]  gabapentin (NEURONTIN) 600 MG tablet Take 600 mg by mouth 4 (four) times daily.    [provider]  hydrALAZINE (APRESOLINE) 100 MG tablet Take 100 mg by mouth daily.    [provider]  prazosin (MINIPRESS) 1 MG capsule Day 1 : 1 mg at night, Day 2: 2 mg at night, Day 3: 3 mg at night 11/07/17   Hisada, Elie Goody, MD  rOPINIRole (REQUIP) 4 MG tablet Take 4 mg by mouth at bedtime.    [provider]  SPIRIVA HANDIHALER 18 MCG inhalation capsule Place 1 puff into inhaler and inhale daily. 08/15/17   [provider]  zolpidem (AMBIEN CR) 12.5 MG CR tablet Take 12.5 mg by mouth at bedtime. 05/16/17   [provider]    Family History Family History  Problem Relation Age of Onset  . Coronary artery disease Father   . Emphysema Father   . Heart attack Father 62       Died age 103  . Stroke Father   . Cancer Father        Unsure of type   . Allergic rhinitis Father   . Depression Mother   . Skin cancer Mother   . Bipolar disorder Brother   . Drug abuse Brother   . Leukemia Brother   . Bipolar disorder Daughter   . Allergic rhinitis Daughter   . Asthma Daughter   . Urticaria Daughter   .  Diabetes Maternal Grandmother     . Stomach cancer Maternal Grandfather   . Heart attack Sister        Died in 51s  . Heart attack Brother        Died age 16  . Stomach cancer Other   . Liver disease Cousin   . Angioedema Neg Hx   . Atopy Neg Hx   . Eczema Neg Hx   . Immunodeficiency Neg Hx   . Breast cancer Neg Hx   . Colon cancer Neg Hx     Social History Social History   Tobacco Use  . Smoking status: Current Every Day Smoker    Packs/day: 1.50    Years: 45.00    Pack years: 67.50    Types: Cigarettes    Start date: 03/04/1971  . Smokeless tobacco: Never Used  Substance Use Topics  . Alcohol use: No    Alcohol/week: 0.0 oz  . Drug use: No     Allergies   Other; Penicillins; Zithromax [azithromycin]; Aspirin; Pineapple; Strawberry extract; Aspartame and phenylalanine; Mushroom extract complex; and Nicardipine   Review of Systems Review of Systems  Constitutional: Negative for diaphoresis and fever.  HENT: Negative for rhinorrhea and sore throat.   Eyes: Negative for redness.  Respiratory: Positive for cough and shortness of breath.   Cardiovascular: Positive for chest pain. Negative for palpitations and leg swelling.  Gastrointestinal: Positive for abdominal pain and nausea. Negative for diarrhea and vomiting.  Genitourinary: Negative for dysuria.  Musculoskeletal: Negative for back pain, myalgias and neck pain.  Skin: Negative for rash.  Neurological: Positive for headaches. Negative for syncope and light-headedness.  Psychiatric/Behavioral: The patient is not nervous/anxious.      Physical Exam Updated Vital Signs BP (!) 176/86   Pulse 79   Temp 98.7 F (37.1 C)   Resp 20   SpO2 96%   Physical Exam  Constitutional: She is oriented to person, place, and time. She appears well-developed and well-nourished.  HENT:  Head: Normocephalic and atraumatic.  Right Ear: Tympanic membrane, external ear and ear canal normal.  Left Ear: Tympanic membrane, external ear and ear canal normal.   Nose: Nose normal.  Mouth/Throat: Uvula is midline, oropharynx is clear and moist and mucous membranes are normal. Mucous membranes are not dry.  Eyes: Pupils are equal, round, and reactive to light. Conjunctivae, EOM and lids are normal. Right eye exhibits no nystagmus. Left eye exhibits no nystagmus.  Neck: Trachea normal and normal range of motion. Neck supple. Normal carotid pulses and no JVD present. No muscular tenderness present. Carotid bruit is not present. No tracheal deviation present.  Cardiovascular: Normal rate, regular rhythm, S1 normal, S2 normal, normal heart sounds and intact distal pulses. Exam reveals no decreased pulses.  No murmur heard. Pulmonary/Chest: Effort normal and breath sounds normal. No respiratory distress. She has no wheezes. She exhibits no tenderness.  Abdominal: Soft. Normal aorta and bowel sounds are normal. There is tenderness (mild, LLQ, winces with palpation). There is no rebound and no guarding.  Musculoskeletal: Normal range of motion.       Cervical back: She exhibits normal range of motion, no tenderness and no bony tenderness.  Exam is inconsistent.  When patient is talking there is a slight left-sided facial weakness noted.  When asked to smile she has complete paralysis of the left side of her face.  When she sticks out her tongue she sticks out to the right side (should deviate to left with L facial  weakness).  She reports decreased sensation.  She has slightly decreased strength in her left arm and leg when compared to the right.  Neurological: She is alert and oriented to person, place, and time. She has normal strength and normal reflexes. No cranial nerve deficit or sensory deficit. She displays a negative Romberg sign. Coordination and gait normal. GCS eye subscore is 4. GCS verbal subscore is 5. GCS motor subscore is 6.  Skin: Skin is warm and dry. She is not diaphoretic. No cyanosis. No pallor.  Psychiatric: She has a normal mood and affect.   Nursing note and vitals reviewed.    ED Treatments / Results  Labs (all labs ordered are listed, but only abnormal results are displayed) Labs Reviewed  I-STAT BETA HCG BLOOD, ED ([facility], [facility], [facility] ONLY) - Abnormal; Notable for the following components:      Result Value   I-stat hCG, quantitative 11.6 (*)    All other components within normal limits  BASIC METABOLIC PANEL  CBC  HEPATIC FUNCTION PANEL  I-STAT TROPONIN, ED    EKG EKG Interpretation  Date/Time:  Tuesday December 20 2017 14:38:24 EDT Ventricular Rate:  94 PR Interval:  160 QRS Duration: 84 QT Interval:  382 QTC Calculation: 477 R Axis:   36 Text Interpretation:  Normal sinus rhythm Normal ECG No significant change since last tracing Confirmed by Blanchie Dessert [phone]) on 12/20/2017 4:27:31 PM    ED ECG REPORT   Date: 12/20/2017  Rate: 94  Rhythm: normal sinus rhythm  QRS Axis: normal  Intervals: normal  ST/T Wave abnormalities: none  Conduction Disutrbances:none  Narrative Interpretation:   Old EKG Reviewed: unchanged from EKG performed at urgent care  I have personally reviewed the EKG tracing and agree with the computerized printout as noted.   Radiology Dg Chest 2 View  Result Date: 12/20/2017 CLINICAL DATA:  Cough, chest pain and abdominal pain. EXAM: CHEST - 2 VIEW COMPARISON:  11/23/2017 FINDINGS: The heart size and mediastinal contours are within normal limits. There is no evidence of pulmonary edema, consolidation, pneumothorax, nodule or pleural fluid. The visualized skeletal structures are unremarkable. IMPRESSION: No active cardiopulmonary disease. Electronically Signed   By: Aletta Edouard M.D.   On: 12/20/2017 16:10   Ct Head Wo Contrast  Result Date: 12/20/2017 CLINICAL DATA:  Headache of sudden onset. Hypertension left-sided facial droop. EXAM: CT HEAD WITHOUT CONTRAST TECHNIQUE: Contiguous axial images were obtained from the base of the skull through the vertex without intravenous contrast.  COMPARISON:  08/20/2017 CT and MRI. FINDINGS: BRAIN: The ventricles and sulci are normal. No acute intraparenchymal hemorrhage, mass effect nor midline shift. No acute large vascular territory infarcts. Chronic small vessel ischemic disease of periventricular white matter. Subtle bilateral posterior occipital white matter changes raise the possibility of PRES/hypertensive encephalopathy. The basal ganglia are unremarkable. No abnormal extra-axial fluid collections. Basal cisterns are not effaced and midline. The brainstem and cerebellar hemispheres are without acute abnormalities. VASCULAR: No hyperdense vessel sign. SKULL/SOFT TISSUES: No skull fracture. No significant soft tissue swelling. ORBITS/SINUSES: The included ocular globes and orbital contents are normal.The mastoid air cells are clear. The included paranasal sinuses are well-aerated. OTHER: Chronic small mastoid effusion on the right, partially opacifying the inferior right mastoid air cells. IMPRESSION: Indistinct subcortical appearing gray-white matter junctures involving the posterior occipital lobes bilaterally that may represent stigmata of hypertensive encephalopathy/PRES. Otherwise chronic small vessel ischemic disease and small right mastoid effusion. Electronically Signed   By: Ashley Royalty M.D.   On:  12/20/2017 19:29    Procedures Procedures (including critical care time)  Medications Ordered in ED Medications  metoCLOPramide (REGLAN) injection 10 mg (10 mg Intravenous Refused 12/20/17 2008)  nicotine (NICODERM CQ - dosed in mg/24 hours) patch 21 mg (21 mg Transdermal Patch Applied 12/20/17 2021)  hydrALAZINE (APRESOLINE) injection 10 mg (10 mg Intravenous Given 12/20/17 1849)  HYDROmorphone (DILAUDID) injection 0.5 mg (0.5 mg Intravenous Given 12/20/17 1849)  ondansetron (ZOFRAN) injection 4 mg (4 mg Intravenous Given 12/20/17 1849)     Initial Impression / Assessment and Plan / ED Course  I have reviewed the triage vital signs and  the nursing notes.  Pertinent labs & imaging results that were available during my care of the patient were reviewed by me and considered in my medical decision making (see chart for details).     Patient seen and examined. Work-up initiated. Medications ordered. Symptoms today very similar to admission in March. Will treat BP, check labs and head CT.   Vital signs reviewed and are as follows: BP (!) 176/86   Pulse 79   Temp 98.7 F (37.1 C)   Resp 20   SpO2 96%   CXR without signs of PNA or pulmonary edema.   Delay in obtaining IV access.  Patient with improved blood pressure after treatment with 1 dose of hydralazine.  CT with signs of PRES.   Given chest pain, facial weakness in setting of uncontrolled hypertension 228/116, will request admission.   Patient is upset about not being able to smoke and not being given a meal tray here, but at this point will accept admission to the hospital.  Nicotine patch ordered.  Patient was given several options for food but refused.  Spoke with [facility] hospitalist who will see patient.  CRITICAL CARE Performed by: Carlisle Cater Total critical care time: 35 minutes Critical care time was exclusive of separately billable procedures and treating other patients. Critical care was necessary to treat or prevent imminent or life-threatening deterioration. Critical care was time spent personally by me on the following activities: development of treatment plan with patient and/or surrogate as well as nursing, discussions with consultants, evaluation of patient's response to treatment, examination of patient, obtaining history from patient or surrogate, ordering and performing treatments and interventions, ordering and review of laboratory studies, ordering and review of radiographic studies, pulse oximetry and re-evaluation of patient's condition.   Final Clinical Impressions(s) / ED Diagnoses   Final diagnoses:  Hypertensive emergency  Facial  weakness  Precordial pain   Admit for above.  ED Discharge Orders    None       Carlisle Cater, Hershal Coria 12/20/17 2027    Blanchie Dessert, MD 12/22/17 2206    Carlisle Cater, PA-C 01/08/18 Orson Eva    Blanchie Dessert, MD 01/09/18 [phone]

## 2017-12-20 NOTE — ED Notes (Signed)
IV team staff at bedside at this time.  

## 2017-12-20 NOTE — ED Notes (Signed)
Patient refused transport to Emergency Department.  [name] PA advised patient to allow this tech to transport patient to ED via wheelchair.  Patient refused again.  Patient states, she will take herself in her vehicle.

## 2017-12-20 NOTE — Progress Notes (Signed)
Pt off the floor to CT.

## 2017-12-20 NOTE — ED Notes (Signed)
Patient refuses to allow RN to attempt to start IV. Order for IV team placed.

## 2017-12-20 NOTE — ED Notes (Signed)
Pt offered reglan. Pt refused saying "it doesn't work for me." Pt stating she needs a cigarette. Pt offered a nicotine patch. Pt refused.

## 2017-12-20 NOTE — ED Notes (Signed)
Tamala Julian, MD at bedside.

## 2017-12-20 NOTE — ED Notes (Signed)
Patient transported to CT 

## 2017-12-20 NOTE — ED Triage Notes (Signed)
The patient presented to the [facility] with multiple complaints.  The patient complained of a cough x 1 week.   The patient reported waking up this am with lower abdominal pain and a headache.

## 2017-12-20 NOTE — Progress Notes (Signed)
Pt admitted to unit from ED. Walked from Biomedical scientist to chair. C/o headache 9/10, offered Tylenol, Pt refused. Pt was anxious, irritable, stating, "I suffer from anxiety, PTSD, depression; I need a cigarette.I'll leave here any time I want. I'll leave AMA, I don't care." Pt refused yellow socks for safety. Gave pt food. Pt welcomed and oriented to unit. Will continue to monitor.

## 2017-12-20 NOTE — ED Notes (Signed)
Patient states that she's been told by her PA that her blood pressure does not need to be treated unless it is >984 systolic.

## 2017-12-20 NOTE — ED Triage Notes (Signed)
Pt was seen at urgent care for headache and cough. She was sent here for hypertension and chest pain. Pt has hx of stroke and MI. Pt alert and oriented. Very hypertensive in triage.

## 2017-12-20 NOTE — ED Provider Notes (Addendum)
57 year old female with history of CVA, HTN, COPD, DM, comes in for multiple complaints.1 week history of cough and woke up this morning with low abdominal pain and headache.  At triage, she was hypertensive at 217/109, repeat blood pressure at 201/114.  She is complaining of right-sided headache that is causing photophobia, phonophobia, nausea. States headache came on suddenly, but denies worse headache in her life.  No obvious blurry vision.  She is also complaining of left-sided chest pain, shortness of breath, lightheadedness.  Denies syncope.  Denies fever, chills, night sweats.  States she has residual left-sided deficits due to previous CVA, and has not noticed any ataxia, aphasia, facial drooping.  States she has been taking her medications as directed.  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 She has been evaluated for possible MS as well. On exam, patient unable to look left and up for both eyes. Left sided lip drooping when trying to smile. She is able to elevated both eyebrows equally. She is able to ambulate on own without difficulty. EKG NSR, 87bpm, no obvious ST changes.   Case discussed with Dr Mannie Stabile. Given no know last normal as patient was unaware she had facial drooping, code stroke was not called. However, exam discontinued due to concerning findings to transfer patient to the emergency department for further evaluation.  Felt patient was stable enough to be wheeled down to the emergency department.  Patient declined, and unwilling to stay for transfer. Discussed worries and concerns for safety of her driving herself to the emergency department. Offered for [place] to walk her to the emergency department. Patient again declined and walked out of this office on own.    Ok Edwards, PA-C 12/20/17 1312    Ok Edwards, PA-C 12/20/17 1313    [name] V, PA-C 12/20/17 1320

## 2017-12-20 NOTE — Progress Notes (Signed)
Pt back on unit from CT

## 2017-12-20 NOTE — Discharge Instructions (Addendum)
Given blood pressure 201/114, with chest pain, headache, left sided facial drooping, please go to the emergency department for further evaluation needed.

## 2017-12-20 NOTE — ED Notes (Signed)
ED Provider at bedside. 

## 2017-12-20 NOTE — H&P (Addendum)
History and Physical    Katrina [name] PPI:[id number] DOB: [birth year] DOA: 12/20/2017  Referring MD/NP/PA: Alben Spittle PCP: [name], FNP  Patient coming from: Urgent care  Chief Complaint: Abdominal pain and cough  I have personally briefly reviewed patient's old medical records in [place]   HPI: Katrina [name] is a 57 y.o. female with medical history significant of Multiple comorbidities including anxiety, asthma, COPD, Headaches, HTN, HLD, GERD, RLS, PTSD, depression, Hx of diverticulitis, Insomnia, Hx of Bipolar, and tobacco abuse, who presents with complaints of abdominal pain ankle.  Abdominal pain  started approximately 2 AM this morning and was located on the left lower quadrant.  Patient describes it as a sharp pain radiating to the left flank and was constant for approximately 1 hour.  Patient placed a heating pad with some improvement in symptoms.  Patient also complains of a chronic productiver cough that has worsened over the last 1 week.  Patient reports coughing so much that is causing chest discomfort.  Associated symptoms include complaints of dysuria and nausea.  Denies any significant fever, vomiting, hemoptysis, diarrhea, constipation, or urinary frequency symptoms.  Patient admits to smoking 1 to 2 packs of cigarettes per day since the age of 64 and does not plan on quitting.  Furthermore patient reports previously being on 9 blood pressure medications that time, but reports that her medications were cut to amlodipine and chlorthalidone.  Review of records shows that patient was seen to have signs of renal artery stenosis back in 08/2016.  Patient was sent from urgent care after being found tohave elevated blood pressures and signs of left-sided facial droop.   ED Course: Admission into the emergency department patient was noted to be afebrile, blood pressures up to 228/116, respirations 16-26, and all other vitals within normal limits.  Labs including CMP and  CBC were unremarkable.  Urinalysis was positive for moderate hemoglobin.  Chest x-ray did not show any acute abnormalities.  CT scan of the brain did show changes suggestive of hypertensive encephalopathy/PRES.   Review of Systems  Constitutional: Positive for malaise/fatigue. Negative for chills and fever.  HENT: Negative for ear discharge and nosebleeds.   Eyes: Negative for double vision and photophobia.  Respiratory: Positive for cough, sputum production and shortness of breath.   Cardiovascular: Positive for chest pain (With coughing). Negative for leg swelling.  Gastrointestinal: Positive for abdominal pain and nausea. Negative for constipation, diarrhea and vomiting.  Genitourinary: Positive for dysuria and flank pain. Negative for hematuria.  Musculoskeletal: Positive for myalgias. Negative for falls.  Neurological: Negative for speech change and loss of consciousness.  Psychiatric/Behavioral: Negative for memory loss and suicidal ideas.    Past Medical History:  Diagnosis Date  . Allergy   . Anemia 1975-1976   . Anxiety    takes Valium daily as needed  . Arthritis    "spine" (12/03/2016)  . Asthma    has inhalers but doesn't use (12/03/2016)  . Chronic bronchitis ([place])    "get it a couple times q yr" (12/03/2016)  . Chronic kidney disease   . Chronic lower back pain    budlging disc   . Claustrophobia   . COPD (chronic obstructive pulmonary disease) ([place])   . Daily headache   . Depression   . Diverticulitis   . Family history of adverse reaction to anesthesia    2 daughters gets extremely sick   . GERD (gastroesophageal reflux disease)    takes Dexilant daily  . Heart  murmur   . History of blood transfusion 1975-1976 "several"   no abnormal reaction noted  . History of colitis   . History of colon polyps    benign  . History of gastric ulcer   . History of hiatal hernia    "small one"  . History of MRSA infection 2017  . Hyperlipidemia    was on meds but has  been off over a yr  . Hypertension    takes Amlodipine and Maxzide daily  . Insomnia    takes Ambien nightly  . Iron deficiency anemia    "when I was young"  . Lung nodules   . Migraine    "2-3/wk" (12/03/2016)  . MS (multiple sclerosis) ([place])    questionable per pt  . Noncompliance   . Osteoporosis   . Peripheral neuropathy    weakness,numbness,and tingling. Takes Gabapentin daily  . PTSD (post-traumatic stress disorder) dx'd 2016/2017   "was dx'd w/bipolar in 1991; replaced w/PTSD dx 2016/2017" (12/03/2016)  . Restless leg syndrome    takes Requip daily  . 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 Type II diabetes mellitus ([place])    "went off insulin in 2012/2013" (12/03/2016)    Past Surgical History:  Procedure Laterality Date  . ABDOMINAL AORTOGRAM N/A 12/03/2016   Procedure: Abdominal Aortogram;  Surgeon: Angelia Mould, MD;  Location: [facility];  Service: Cardiovascular;  Laterality: N/A;  . ABDOMINAL HYSTERECTOMY  12/1987  . ADENOIDECTOMY  1975  . ANKLE SURGERY Bilateral 1993; 1995 X2   "stabilzation done; 1 on the right; 2 on the left"  . APPENDECTOMY  1989  . BREAST EXCISIONAL BIOPSY Left   . BREAST LUMPECTOMY Left    "benign tumor"  . CARDIAC CATHETERIZATION N/A 01/31/2015   Procedure: Left Heart Cath and Coronary Angiography;  Surgeon: Burnell Blanks, MD;  Location: [facility];  Service: Cardiovascular;  Laterality: N/A;  . COLONOSCOPY    . DILATION AND CURETTAGE OF UTERUS    . ESOPHAGOGASTRODUODENOSCOPY    . LAPAROSCOPIC CHOLECYSTECTOMY  1996  . NASAL RECONSTRUCTION  1976  . NASAL SINUS SURGERY  1975  . POSTERIOR LUMBAR FUSION  2005  . RADIOLOGY WITH ANESTHESIA N/A 01/15/2016   Procedure: MRI LUMBAR SPINE WITHOUT;  Surgeon: Medication Radiologist, MD;  Location: [facility];  Service: Radiology;  Laterality: N/A;  . RADIOLOGY WITH ANESTHESIA N/A 06/29/2016     Procedure: MRI OF THE BRAIN WITH AND WITHOUT;  Surgeon: Medication Radiologist, MD;  Location: [facility];  Service: Radiology;  Laterality: N/A;  . RENAL ANGIOGRAPHY N/A 12/03/2016   Procedure: Renal Angiography;  Surgeon: Angelia Mould, MD;  Location: [facility];  Service: Cardiovascular;  Laterality: N/A;  . TONSILLECTOMY  1992  . TUBAL LIGATION    . TUMOR EXCISION  1998   from back of skull; developed;  MRSA from the area that had to be packed     reports that she has been smoking cigarettes.  She started smoking about 46 years ago. She has a 67.50 pack-year smoking history. She has never used smokeless tobacco. She reports that she does not drink alcohol or use drugs.  Allergies  Allergen Reactions  . Other Anaphylaxis and Swelling    [place]  . Penicillins Anaphylaxis    Has patient had a PCN reaction causing immediate rash, facial/tongue/throat swelling, SOB or lightheadedness with hypotension: Yes Has  patient had a PCN reaction causing severe rash involving mucus membranes or skin necrosis: Yes Has patient had a PCN reaction that required hospitalization Yes Has patient had a PCN reaction occurring within the last 10 years: No If all of the above answers are "NO", then may proceed with Cephalosporin use.   . Zithromax [Azithromycin] Anaphylaxis  . Aspirin Other (See Comments)    Due to stomach ulcers.   . Pineapple Rash  . Strawberry Extract Rash and Hives  . Aspartame And Phenylalanine Palpitations  . Mushroom Extract Complex Rash  . Nicardipine Nausea And Vomiting and Other (See Comments)    shaking    Family History  Problem Relation Age of Onset  . Coronary artery disease Father   . Emphysema Father   . Heart attack Father 52       Died age 47  . Stroke Father   . Cancer Father        Unsure of type   . Allergic rhinitis Father   . Depression Mother   . Skin cancer Mother   . Bipolar disorder Brother   . Drug abuse Brother   . Leukemia Brother    . Bipolar disorder Daughter   . Allergic rhinitis Daughter   . Asthma Daughter   . Urticaria Daughter   . Diabetes Maternal Grandmother   . Stomach cancer Maternal Grandfather   . Heart attack Sister        Died in 36s  . Heart attack Brother        Died age 26  . Stomach cancer Other   . Liver disease Cousin   . Angioedema Neg Hx   . Atopy Neg Hx   . Eczema Neg Hx   . Immunodeficiency Neg Hx   . Breast cancer Neg Hx   . Colon cancer Neg Hx     Prior to Admission medications   Medication Sig Start Date End Date Taking? Authorizing Provider  amLODipine (NORVASC) 10 MG tablet Take 1 tablet (10 mg total) by mouth daily. 06/05/14   Herminio Commons, MD  ARIPiprazole (ABILIFY) 2 MG tablet Take 1 tablet (2 mg total) by mouth daily. 12/07/17   Norman Clay, MD  aspirin-acetaminophen-caffeine (EXCEDRIN EXTRA STRENGTH) [phone] MG tablet Take 2 tablets by mouth daily as needed for headache or migraine.     [provider]  chlorthalidone (HYGROTON) 25 MG tablet Take 25 mg by mouth 3 (three) times daily.    [provider]  clopidogrel (PLAVIX) 75 MG tablet Take 75 mg by mouth daily.     [provider]  dexlansoprazole (DEXILANT) 60 MG capsule Take 1 tab by mouth every morning. Patient taking differently: Take 60 mg by mouth every evening.  10/18/16   Esterwood, Amy S, PA-C  doxycycline (VIBRAMYCIN) 100 MG capsule Take 100 mg by mouth 2 (two) times daily. For 10 days 12/15/17 12/25/17  [provider]  EPINEPHrine (EPIPEN 2-PAK) 0.3 mg/0.3 mL IJ SOAJ injection Inject into the muscle.    [provider]  escitalopram (LEXAPRO) 10 MG tablet Take 1 tablet (10 mg total) by mouth daily. S 12/07/17   Norman Clay, MD  FLOVENT HFA 220 MCG/ACT inhaler Inhale 2 puffs into the lungs 2 (two) times daily. 08/17/17   [provider]  gabapentin (NEURONTIN) 600 MG tablet Take 600 mg by mouth 4 (four) times daily.    [provider]   hydrALAZINE (APRESOLINE) 100 MG tablet Take 100 mg by mouth daily.  [provider]  prazosin (MINIPRESS) 1 MG capsule Day 1 : 1 mg at night, Day 2: 2 mg at night, Day 3: 3 mg at night 11/07/17   Hisada, Elie Goody, MD  rOPINIRole (REQUIP) 4 MG tablet Take 4 mg by mouth at bedtime.    [provider]  SPIRIVA HANDIHALER 18 MCG inhalation capsule Place 1 puff into inhaler and inhale daily. 08/15/17   [provider]  zolpidem (AMBIEN CR) 12.5 MG CR tablet Take 12.5 mg by mouth at bedtime. 05/16/17   [provider]    Physical Exam:  Constitutional: NAD, calm, comfortable Vitals:   12/20/17 1730 12/20/17 1933 12/20/17 2000 12/20/17 2003  BP: (!) 213/111 (!) 183/97 (!) 176/86   Pulse: 68 74 79   Resp: 18 (!) 26 20   Temp:  98.8 F (37.1 C)  98.7 F (37.1 C)  TempSrc:  Oral    SpO2: 98% 97% 96%    Eyes: PERRL, lids and conjunctivae normal ENMT: Mucous membranes are moist. Posterior pharynx clear of any exudate or lesions. Normal dentition.  Neck: normal, supple, no masses, no thyromegaly Respiratory: Mildly decreased aeration, no wheezing, no crackles. Normal respiratory effort. No accessory muscle use.  Cardiovascular: Regular rate and rhythm, no murmurs / rubs / gallops.  Trace bilateral lower extremity edema. 2+ pedal pulses. No carotid bruits.  Abdomen: Tenderness to palpation of the left lower quadrant with some mild CVA tenderness noted.  No masses palpated. No hepatosplenomegaly. Bowel sounds positive.  Musculoskeletal: no clubbing / cyanosis. No joint deformity upper and lower extremities. Good ROM, no contractures. Normal muscle tone.  Skin: no rashes, lesions, ulcers. No induration Neurologic: CN 2-12 grossly intact. Sensation intact, DTR normal. Strength 5/5 in all 4.  Psychiatric: Normal judgment and insight. Alert and oriented x 3. Normal mood.     Labs on Admission: I have personally reviewed following labs and imaging  studies  CBC: Recent Labs  Lab 12/20/17 1518  WBC 9.3  HGB 13.3  HCT 41.2  MCV 89.8  PLT 597   Basic Metabolic Panel: Recent Labs  Lab 12/20/17 1518  NA 142  K 3.8  CL 111  CO2 22  GLUCOSE 92  BUN 13  CREATININE 0.87  CALCIUM 9.1   GFR: Estimated Creatinine Clearance: 85.3 mL/min (by C-G formula based on SCr of 0.87 mg/dL). Liver Function Tests: Recent Labs  Lab 12/20/17 1518  AST 15  ALT 14  ALKPHOS 67  BILITOT 0.4  PROT 6.7  ALBUMIN 3.6   No results for input(s): LIPASE, AMYLASE in the last 168 hours. No results for input(s): AMMONIA in the last 168 hours. Coagulation Profile: No results for input(s): INR, PROTIME in the last 168 hours. Cardiac Enzymes: No results for input(s): CKTOTAL, CKMB, CKMBINDEX, TROPONINI in the last 168 hours. BNP (last 3 results) No results for input(s): PROBNP in the last 8760 hours. HbA1C: No results for input(s): HGBA1C in the last 72 hours. CBG: No results for input(s): GLUCAP in the last 168 hours. Lipid Profile: No results for input(s): CHOL, HDL, LDLCALC, TRIG, CHOLHDL, LDLDIRECT in the last 72 hours. Thyroid Function Tests: No results for input(s): TSH, T4TOTAL, FREET4, T3FREE, THYROIDAB in the last 72 hours. Anemia Panel: No results for input(s): VITAMINB12, FOLATE, FERRITIN, TIBC, IRON, RETICCTPCT in the last 72 hours. Urine analysis:    Component Value Date/Time   COLORURINE YELLOW 11/02/2017 1557   APPEARANCEUR HAZY (A) 11/02/2017 1557   LABSPEC 1.025 12/20/2017 1241   PHURINE 6.0  12/20/2017 1241   GLUCOSEU NEGATIVE 12/20/2017 1241   HGBUR MODERATE (A) 12/20/2017 1241   BILIRUBINUR NEGATIVE 12/20/2017 1241   KETONESUR NEGATIVE 12/20/2017 1241   PROTEINUR NEGATIVE 12/20/2017 1241   UROBILINOGEN 0.2 12/20/2017 1241   NITRITE NEGATIVE 12/20/2017 1241   LEUKOCYTESUR NEGATIVE 12/20/2017 1241   Sepsis Labs: No results found for this or any previous visit (from the past 240 hour(s)).   Radiological Exams on  Admission: Dg Chest 2 View  Result Date: 12/20/2017 CLINICAL DATA:  Cough, chest pain and abdominal pain. EXAM: CHEST - 2 VIEW COMPARISON:  11/23/2017 FINDINGS: The heart size and mediastinal contours are within normal limits. There is no evidence of pulmonary edema, consolidation, pneumothorax, nodule or pleural fluid. The visualized skeletal structures are unremarkable. IMPRESSION: No active cardiopulmonary disease. Electronically Signed   By: Aletta Edouard M.D.   On: 12/20/2017 16:10   Ct Head Wo Contrast  Result Date: 12/20/2017 CLINICAL DATA:  Headache of sudden onset. Hypertension left-sided facial droop. EXAM: CT HEAD WITHOUT CONTRAST TECHNIQUE: Contiguous axial images were obtained from the base of the skull through the vertex without intravenous contrast. COMPARISON:  08/20/2017 CT and MRI. FINDINGS: BRAIN: The ventricles and sulci are normal. No acute intraparenchymal hemorrhage, mass effect nor midline shift. No acute large vascular territory infarcts. Chronic small vessel ischemic disease of periventricular white matter. Subtle bilateral posterior occipital white matter changes raise the possibility of PRES/hypertensive encephalopathy. The basal ganglia are unremarkable. No abnormal extra-axial fluid collections. Basal cisterns are not effaced and midline. The brainstem and cerebellar hemispheres are without acute abnormalities. VASCULAR: No hyperdense vessel sign. SKULL/SOFT TISSUES: No skull fracture. No significant soft tissue swelling. ORBITS/SINUSES: The included ocular globes and orbital contents are normal.The mastoid air cells are clear. The included paranasal sinuses are well-aerated. OTHER: Chronic small mastoid effusion on the right, partially opacifying the inferior right mastoid air cells. IMPRESSION: Indistinct subcortical appearing gray-white matter junctures involving the posterior occipital lobes bilaterally that may represent stigmata of hypertensive encephalopathy/PRES.  Otherwise chronic small vessel ischemic disease and small right mastoid effusion. Electronically Signed   By: Ashley Royalty M.D.   On: 12/20/2017 19:29    EKG: Independently reviewed.  Normal sinus rhythm at 97 bpm with QTc 477  Assessment/Plan Hypertensive emergency, suspect PRES: Patient presents with blood pressures elevated up to 228/116 on admission.  Patient reports only being on blood pressure medications of amlodipine and chlorthalidone at this time. - Admit to a telemetry bed - Neurochecks - Continue home blood pressure medications amlodipine  Cough, history of COPD: Chest x-ray shows no clear signs of infection.  Lungs sound clear with no wheezes noted.  Suspect related with tobacco abuse. - Mucinex   Left lower quadrant abdominal pain, hematuria: Patient with acute tenderness to palpation on the left lower quadrant of the abdomen with some mild flank tenderness.  Urinalysis was positive for signs of hematuria.  Question possibility of nephrolithiasis versus diverticulitis. - add on lipase  - Check renal CT rule out  History of renal artery stenosis: Duplex ultrasound of the renal arteries showed signs of renal artery stenosis back in 08/2016.  Patient reports having no follow-up.  History of CVA with residual deficit - Continue Plavix   Tobacco abuse: Patient reports smoking 1 to 2 ppd and has no will wanting to quit at this time. - Counseled on the need of cessation of tobacco use  GERD - Continue PPI   DVT prophylaxis: Lovenox Code Status: Full Family Communication: No family  present at bedside Disposition Plan: Likely discharge home once medically stable Consults called:None Admission status: Observation  Norval Morton MD [facility] Pager [phone]   If 7PM-7AM, please contact night-coverage [email or web address] Password [email or web address]  12/20/2017, 8:20 PM

## 2017-12-20 NOTE — ED Notes (Signed)
Pt given coke. Pt offered [place] sandwich, stating shes allergic. Pt offered peanut butter/crackers, pt states can't have it, sticks to the dentures. Pt offered apple sauce, pt refused.

## 2017-12-21 DIAGNOSIS — E1122 Type 2 diabetes mellitus with diabetic chronic kidney disease: Secondary | ICD-10-CM | POA: Diagnosis not present

## 2017-12-21 DIAGNOSIS — Z720 Tobacco use: Secondary | ICD-10-CM

## 2017-12-21 DIAGNOSIS — F4310 Post-traumatic stress disorder, unspecified: Secondary | ICD-10-CM | POA: Diagnosis not present

## 2017-12-21 DIAGNOSIS — Z9119 Patient's noncompliance with other medical treatment and regimen: Secondary | ICD-10-CM

## 2017-12-21 DIAGNOSIS — I161 Hypertensive emergency: Secondary | ICD-10-CM | POA: Diagnosis not present

## 2017-12-21 DIAGNOSIS — I129 Hypertensive chronic kidney disease with stage 1 through stage 4 chronic kidney disease, or unspecified chronic kidney disease: Secondary | ICD-10-CM | POA: Diagnosis not present

## 2017-12-21 DIAGNOSIS — R1032 Left lower quadrant pain: Secondary | ICD-10-CM

## 2017-12-21 DIAGNOSIS — O26899 Other specified pregnancy related conditions, unspecified trimester: Secondary | ICD-10-CM | POA: Diagnosis present

## 2017-12-21 LAB — CBC
HEMATOCRIT: 39.3 % (ref 36.0–46.0)
Hemoglobin: 12.6 g/dL (ref 12.0–15.0)
MCH: 29.2 pg (ref 26.0–34.0)
MCHC: 32.1 g/dL (ref 30.0–36.0)
MCV: 91.2 fL (ref 78.0–100.0)
PLATELETS: 228 10*3/uL (ref 150–400)
RBC: 4.31 MIL/uL (ref 3.87–5.11)
RDW: 14.6 % (ref 11.5–15.5)
WBC: 8.6 10*3/uL (ref 4.0–10.5)

## 2017-12-21 LAB — TROPONIN I: Troponin I: <0.03 ng/mL (ref <0.03)

## 2017-12-21 LAB — BASIC METABOLIC PANEL
ANION GAP: 8 (ref 5–15)
BUN: 15 mg/dL (ref 6–20)
CALCIUM: 8.8 mg/dL — AB (ref 8.9–10.3)
CO2: 26 mmol/L (ref 22–32)
CREATININE: 0.96 mg/dL (ref 0.44–1.00)
Chloride: 109 mmol/L (ref 98–111)
GFR calc Af Amer: >60 mL/min (ref >60)
GLUCOSE: 121 mg/dL — AB (ref 70–99)
Potassium: 3.7 mmol/L (ref 3.5–5.1)
Sodium: 143 mmol/L (ref 135–145)

## 2017-12-21 LAB — LIPASE, BLOOD: Lipase: 29 U/L (ref 11–51)

## 2017-12-21 MED ORDER — HYDRALAZINE HCL 25 MG PO TABS
25.0000 mg | ORAL_TABLET | Freq: Three times a day (TID) | ORAL | Status: DC
  Administered 2017-12-21: 25 mg via ORAL
  Filled 2017-12-21: qty 1

## 2017-12-21 MED ORDER — LOSARTAN POTASSIUM 50 MG PO TABS: 50.0000 mg | ORAL_TABLET | Freq: Every day | ORAL | Status: DC

## 2017-12-21 MED ORDER — HYDRALAZINE HCL 25 MG PO TABS: 25.0000 mg | ORAL_TABLET | Freq: Three times a day (TID) | ORAL | 0 refills | Status: DC

## 2017-12-21 MED ORDER — CLOPIDOGREL BISULFATE 75 MG PO TABS
75.0000 mg | ORAL_TABLET | Freq: Every day | ORAL | Status: DC
  Administered 2017-12-21 – 2017-12-22 (×2): 75 mg via ORAL
  Filled 2017-12-21 (×2): qty 1

## 2017-12-21 MED ORDER — ATENOLOL 25 MG PO TABS: 25.0000 mg | ORAL_TABLET | Freq: Every day | ORAL | 0 refills | Status: DC

## 2017-12-21 MED ORDER — CHLORTHALIDONE 25 MG PO TABS: 25.0000 mg | ORAL_TABLET | Freq: Every day | ORAL | 0 refills | Status: DC

## 2017-12-21 MED ORDER — AMLODIPINE BESYLATE 10 MG PO TABS
10.0000 mg | ORAL_TABLET | Freq: Every day | ORAL | Status: DC
  Administered 2017-12-21 – 2017-12-22 (×2): 10 mg via ORAL
  Filled 2017-12-21 (×2): qty 1

## 2017-12-21 MED ORDER — ASPIRIN-ACETAMINOPHEN-CAFFEINE 250-250-65 MG PO TABS
1.0000 | ORAL_TABLET | Freq: Three times a day (TID) | ORAL | Status: DC | PRN
  Administered 2017-12-21: 1 via ORAL
  Filled 2017-12-21 (×2): qty 1

## 2017-12-21 MED ORDER — GABAPENTIN 300 MG PO CAPS
600.0000 mg | ORAL_CAPSULE | Freq: Four times a day (QID) | ORAL | Status: DC
  Administered 2017-12-21 – 2017-12-22 (×6): 600 mg via ORAL
  Filled 2017-12-21 (×6): qty 2

## 2017-12-21 MED ORDER — ROPINIROLE HCL 1 MG PO TABS
4.0000 mg | ORAL_TABLET | Freq: Once | ORAL | Status: AC
  Administered 2017-12-21: 4 mg via ORAL
  Filled 2017-12-21: qty 4

## 2017-12-21 MED ORDER — HYDRALAZINE HCL 50 MG PO TABS
50.0000 mg | ORAL_TABLET | Freq: Three times a day (TID) | ORAL | Status: DC
  Administered 2017-12-22: 50 mg via ORAL
  Filled 2017-12-21 (×2): qty 1

## 2017-12-21 MED ORDER — ROPINIROLE HCL 1 MG PO TABS
4.0000 mg | ORAL_TABLET | Freq: Every day | ORAL | Status: DC
  Administered 2017-12-21: 4 mg via ORAL
  Filled 2017-12-21 (×2): qty 4

## 2017-12-21 MED ORDER — PANTOPRAZOLE SODIUM 40 MG PO TBEC
40.0000 mg | DELAYED_RELEASE_TABLET | Freq: Every day | ORAL | Status: DC
  Administered 2017-12-21 – 2017-12-22 (×2): 40 mg via ORAL
  Filled 2017-12-21 (×2): qty 1

## 2017-12-21 MED ORDER — GABAPENTIN 300 MG PO CAPS
600.0000 mg | ORAL_CAPSULE | Freq: Once | ORAL | Status: AC
  Administered 2017-12-21: 600 mg via ORAL
  Filled 2017-12-21: qty 2

## 2017-12-21 MED ORDER — ZOLPIDEM TARTRATE 5 MG PO TABS
5.0000 mg | ORAL_TABLET | Freq: Every evening | ORAL | Status: DC | PRN
Start: 2017-12-21 — End: 2017-12-22
  Administered 2017-12-21: 5 mg via ORAL
  Filled 2017-12-21: qty 1

## 2017-12-21 MED ORDER — ATENOLOL 25 MG PO TABS
25.0000 mg | ORAL_TABLET | Freq: Every day | ORAL | Status: DC
  Administered 2017-12-21 – 2017-12-22 (×2): 25 mg via ORAL
  Filled 2017-12-21 (×2): qty 1

## 2017-12-21 NOTE — Progress Notes (Signed)
Progress Note    Katrina [name]  NID:[id number] DOB: Aug 16, 1960  DOA: 12/20/2017 PCP: [name], FNP    Brief Narrative:     Medical records reviewed and are as summarized below:    Assessment/Plan:   Principal Problem:   Hypertensive emergency Active Problems:   Moderate COPD (chronic obstructive pulmonary disease) (HCC)   Tobacco abuse   History of CVA with residual deficit   Left lower quadrant abdominal pain affecting pregnancy  HTN emergency due to NONCOMPLIANCE -not taking medications as prescribed -no plans to follow a low salt diet -NO SIGN of renal artery stenosis when evaluated by vascular -patient does not want to take meds and has no plans to take them when she leaves-- says her "dr" told her her pressure was ok around 250/150 -recent sleep study w/o signs of apnea Per Dr. Percival Spanish in 08/2017: CHEST PAIN:  No objective evidence if ischemia.  She had normal coronary arteries on cath in the past.  She has had a history of chronic chest pain.  Her pain currently is very atypical.  Exam is unremarkable.  I do not suspect an acute coronary syndrome.  I do not suspect aortic dissection as there is no clinical, radiologic or historical suggestion of this.  No further cardiac work up at this point.    HTN:  She is refusing meds per nursing notes.   This has been a significant problem.  She has at high risk for imminent problems with possible stroke, MI, dissection and more chronic progressive end organ damage.  However, none of this can be immediately affected by more testing or procedures.  Rather she needs to take the excellent med list as prescribed and consent to low salt heart healthy diet.  TOBACCO ABUSE:   She is aware of the importance of smoking cessation.      PTSD -suspect this is causing the majority of her issues  Chronic chest pain -cath in 2016 w/o issue  obesity Body mass index is 39.51 kg/m.  Tobacco abuse -encouraged  cessation   Family Communication/Anticipated D/C date and plan/Code Status     Medical Consultants:    None.    Subjective:   Does not want to follow a low salt diet.. Does not plan to take her BP Meds  Objective:    Vitals:   12/21/17 0928 12/21/17 1202 12/21/17 1321 12/21/17 1558  BP: (!) 195/96 (!) 191/99 (!) 178/94 (!) 187/98  Pulse: 72 80 84 84  Resp: 18 18 20 20   Temp: 98.1 F (36.7 C) 98.3 F (36.8 C) 98.1 F (36.7 C) 98.1 F (36.7 C)  TempSrc: Oral Oral Oral Oral  SpO2:  97% 94% 95%  Height:        Intake/Output Summary (Last 24 hours) at 12/21/2017 1659 Last data filed at 12/21/2017 1200 Gross per 24 hour  Intake 360 ml  Output -  Net 360 ml   There were no vitals filed for this visit.  Exam: Obese female sitting in the chair Poor eye contact  Data Reviewed:   I have personally reviewed following labs and imaging studies:  Labs: Labs show the following:   Basic Metabolic Panel: Recent Labs  Lab 12/20/17 1518 12/21/17 0401  NA 142 143  K 3.8 3.7  CL 111 109  CO2 22 26  GLUCOSE 92 121*  BUN 13 15  CREATININE 0.87 0.96  CALCIUM 9.1 8.8*   GFR CrCl cannot be calculated (Unknown ideal weight.). Liver Function  Tests: Recent Labs  Lab 12/20/17 1518  AST 15  ALT 14  ALKPHOS 67  BILITOT 0.4  PROT 6.7  ALBUMIN 3.6   Recent Labs  Lab 12/20/17 1613  LIPASE 29   No results for input(s): AMMONIA in the last 168 hours. Coagulation profile No results for input(s): INR, PROTIME in the last 168 hours.  CBC: Recent Labs  Lab 12/20/17 1518 12/21/17 0401  WBC 9.3 8.6  HGB 13.3 12.6  HCT 41.2 39.3  MCV 89.8 91.2  PLT 244 228   Cardiac Enzymes: Recent Labs  Lab 12/21/17 1400  TROPONINI <0.03   BNP (last 3 results) No results for input(s): PROBNP in the last 8760 hours. CBG: No results for input(s): GLUCAP in the last 168 hours. D-Dimer: No results for input(s): DDIMER in the last 72 hours. Hgb A1c: No results for  input(s): HGBA1C in the last 72 hours. Lipid Profile: No results for input(s): CHOL, HDL, LDLCALC, TRIG, CHOLHDL, LDLDIRECT in the last 72 hours. Thyroid function studies: No results for input(s): TSH, T4TOTAL, T3FREE, THYROIDAB in the last 72 hours.  Invalid input(s): FREET3 Anemia work up: No results for input(s): VITAMINB12, FOLATE, FERRITIN, TIBC, IRON, RETICCTPCT in the last 72 hours. Sepsis Labs: Recent Labs  Lab 12/20/17 1518 12/21/17 0401  WBC 9.3 8.6    Microbiology No results found for this or any previous visit (from the past 240 hour(s)).  Procedures and diagnostic studies:  Dg Chest 2 View  Result Date: 12/20/2017 CLINICAL DATA:  Cough, chest pain and abdominal pain. EXAM: CHEST - 2 VIEW COMPARISON:  11/23/2017 FINDINGS: The heart size and mediastinal contours are within normal limits. There is no evidence of pulmonary edema, consolidation, pneumothorax, nodule or pleural fluid. The visualized skeletal structures are unremarkable. IMPRESSION: No active cardiopulmonary disease. Electronically Signed   By: Aletta Edouard M.D.   On: 12/20/2017 16:10   Ct Head Wo Contrast  Result Date: 12/20/2017 CLINICAL DATA:  Headache of sudden onset. Hypertension left-sided facial droop. EXAM: CT HEAD WITHOUT CONTRAST TECHNIQUE: Contiguous axial images were obtained from the base of the skull through the vertex without intravenous contrast. COMPARISON:  08/20/2017 CT and MRI. FINDINGS: BRAIN: The ventricles and sulci are normal. No acute intraparenchymal hemorrhage, mass effect nor midline shift. No acute large vascular territory infarcts. Chronic small vessel ischemic disease of periventricular white matter. Subtle bilateral posterior occipital white matter changes raise the possibility of PRES/hypertensive encephalopathy. The basal ganglia are unremarkable. No abnormal extra-axial fluid collections. Basal cisterns are not effaced and midline. The brainstem and cerebellar hemispheres are  without acute abnormalities. VASCULAR: No hyperdense vessel sign. SKULL/SOFT TISSUES: No skull fracture. No significant soft tissue swelling. ORBITS/SINUSES: The included ocular globes and orbital contents are normal.The mastoid air cells are clear. The included paranasal sinuses are well-aerated. OTHER: Chronic small mastoid effusion on the right, partially opacifying the inferior right mastoid air cells. IMPRESSION: Indistinct subcortical appearing gray-white matter junctures involving the posterior occipital lobes bilaterally that may represent stigmata of hypertensive encephalopathy/PRES. Otherwise chronic small vessel ischemic disease and small right mastoid effusion. Electronically Signed   By: Ashley Royalty M.D.   On: 12/20/2017 19:29   Ct Renal Stone Study  Result Date: 12/20/2017 CLINICAL DATA:  57 y/o  F; left-sided abdominal pain. EXAM: CT ABDOMEN AND PELVIS WITHOUT CONTRAST TECHNIQUE: Multidetector CT imaging of the abdomen and pelvis was performed following the standard protocol without IV contrast. COMPARISON:  11/02/2017 CT abdomen and pelvis FINDINGS: Lower chest: Stable 2-3 mm perifissural  nodules at the lung bases, likely intrapulmonary lymph nodes. Hepatobiliary: No focal liver abnormality is seen. Status post cholecystectomy. No biliary dilatation. Pancreas: Unremarkable. No pancreatic ductal dilatation or surrounding inflammatory changes. Spleen: Normal in size without focal abnormality. Adrenals/Urinary Tract: Adrenal glands are unremarkable. Kidneys are normal, without renal calculi, focal lesion, or hydronephrosis. Bladder is unremarkable. Stomach/Bowel: Stomach is within normal limits. Appendix not identified, no pericecal inflammation. No evidence of bowel wall thickening, distention, or inflammatory changes. Vascular/Lymphatic: Aortic atherosclerosis. No enlarged abdominal or pelvic lymph nodes. Reproductive: Status post hysterectomy. No adnexal masses. Other: No abdominal wall hernia or  abnormality. No abdominopelvic ascites. Musculoskeletal: L5-S1 posterior instrumented fusion with vertical rods and paired pedicle screws as well as and interbody prosthesis. No periprosthetic lucency or fracture identified. IMPRESSION: No acute process of the abdomen or pelvis. No hydronephrosis or urinary stone disease identified. Stable CT of the abdomen and pelvis. Electronically Signed   By: Kristine Garbe M.D.   On: 12/20/2017 23:52    Medications:   . amLODipine  10 mg Oral Daily  . atenolol  25 mg Oral Daily  . clopidogrel  75 mg Oral Daily  . enoxaparin (LOVENOX) injection  40 mg Subcutaneous Q24H  . gabapentin  600 mg Oral QID  . guaiFENesin  600 mg Oral BID  . hydrALAZINE  25 mg Oral Q8H  . metoCLOPramide (REGLAN) injection  10 mg Intravenous Once  . nicotine  21 mg Transdermal Once  . pantoprazole  40 mg Oral Daily  . rOPINIRole  4 mg Oral QHS   Continuous Infusions:   LOS: 0 days   Geradine Girt  [facility]   *Please refer to [email or web address], password [email or web address] to get updated schedule on who will round on this patient, as hospitalists switch teams weekly. If 7PM-7AM, please contact night-coverage at [email or web address], password [email or web address] for any overnight needs.  12/21/2017, 4:59 PM

## 2017-12-21 NOTE — Progress Notes (Signed)
Patient's BP is high and pt refused to take BP meds ,  explained the importants of mediaction, but pt said she knows everything, she does not need to be bothered, refused all cares, stated that she wants to leave early in the morning. MD has been notified and charge nurse is awared too.

## 2017-12-21 NOTE — Progress Notes (Signed)
Pt stated not afraid of MS. Pt stated family has history of high blood pressure and that it is already effecting her body. Stated used to use large amounts of salt and pepper on everything but has reduced salt intake slightly. Asked if pt knew how to look at nutrition facts to help monitor sodium intake and patient stated verbally did understand how to read nutrition facts.

## 2017-12-22 DIAGNOSIS — Z9114 Patient's other noncompliance with medication regimen: Secondary | ICD-10-CM | POA: Diagnosis not present

## 2017-12-22 DIAGNOSIS — I161 Hypertensive emergency: Secondary | ICD-10-CM | POA: Diagnosis not present

## 2017-12-22 DIAGNOSIS — F4310 Post-traumatic stress disorder, unspecified: Secondary | ICD-10-CM | POA: Diagnosis not present

## 2017-12-22 MED ORDER — LOSARTAN POTASSIUM 50 MG PO TABS
50.0000 mg | ORAL_TABLET | Freq: Two times a day (BID) | ORAL | Status: DC
  Administered 2017-12-22: 50 mg via ORAL
  Filled 2017-12-22: qty 1

## 2017-12-22 MED ORDER — ATENOLOL 25 MG PO TABS: 25.0000 mg | ORAL_TABLET | Freq: Every day | ORAL | 0 refills | Status: DC

## 2017-12-22 MED ORDER — LOSARTAN POTASSIUM 50 MG PO TABS: 50.0000 mg | ORAL_TABLET | Freq: Two times a day (BID) | ORAL | 0 refills | Status: DC

## 2017-12-22 MED ORDER — HYDRALAZINE HCL 50 MG PO TABS: 50.0000 mg | ORAL_TABLET | Freq: Three times a day (TID) | ORAL | 0 refills | Status: DC

## 2017-12-22 MED ORDER — CHLORTHALIDONE 25 MG PO TABS: 25.0000 mg | ORAL_TABLET | Freq: Every day | ORAL | 0 refills | Status: DC

## 2017-12-22 MED ORDER — NICOTINE 21 MG/24HR TD PT24
21.0000 mg | MEDICATED_PATCH | Freq: Once | TRANSDERMAL | Status: DC
  Administered 2017-12-22: 21 mg via TRANSDERMAL
  Filled 2017-12-22: qty 1

## 2017-12-22 NOTE — Progress Notes (Signed)
Patient for discharge home today,  BP remains elevated.  Dr.Vann notified and awared of the BP issue.   Blood pressure medicaitons adjusted.  IV and telemetry discontinued.  AVS discussed.

## 2017-12-22 NOTE — Progress Notes (Signed)
Central monitor called and reported 5 beats run of V tach on monitor.  Patient is asymtommatic.

## 2017-12-22 NOTE — Discharge Summary (Signed)
Physician Discharge Summary  Katrina [name] GGY:[id number] DOB: September 22, 1960 DOA: 12/20/2017  PCP: [name], FNP  Admit date: 12/20/2017 Discharge date: 12/22/2017  Admitted From: home Discharge disposition: home   Recommendations for Outpatient Follow-Up:   Patient does not plan to stop smoking despite education nor does she plan to follow a low salt diet and take her medications -- refused to meet with dietician.   -not sure how accurate BP is here in hospital as DBP very variable, cuff is too small for upper arm so being measured on wrist ? If patient is actually taking the medications given as she is so resistant to changes in medications  Discharge Diagnosis:   Principal Problem:   Hypertensive emergency Active Problems:   Moderate COPD (chronic obstructive pulmonary disease) (HCC)   Tobacco abuse   History of CVA with residual deficit   Left lower quadrant abdominal pain affecting pregnancy    Discharge Condition: stable  Diet recommendation: Low sodium, heart healthy- patient refusing  Wound care: None.  Code status: Full.   History of Present Illness:   Katrina [name] is a 57 y.o. female with medical history significant ofMultiple comorbidities including anxiety, asthma, COPD, Headaches, HTN, HLD, GERD, RLS, PTSD, depression, Hx of diverticulitis, Insomnia, Hx of Bipolar, and tobacco abuse, who presents with complaints of abdominal pain ankle.  Abdominal pain  started approximately 2 AM this morning and was located on the left lower quadrant.  Patient describes it as a sharp pain radiating to the left flank and was constant for approximately 1 hour.  Patient placed a heating pad with some improvement in symptoms.  Patient also complains of a chronic productiver cough that has worsened over the last 1 week.  Patient reports coughing so much that is causing chest discomfort.  Associated symptoms include complaints of dysuria and nausea.  Denies any  significant fever, vomiting, hemoptysis, diarrhea, constipation, or urinary frequency symptoms.  Patient admits to smoking 1 to 2 packs of cigarettes per day since the age of 32 and does not plan on quitting.  Furthermore patient reports previously being on 9 blood pressure medications that time, but reports that her medications were cut to amlodipine and chlorthalidone.  Review of records shows that patient was seen to have signs of renal artery stenosis back in 08/2016.  Patient was sent from urgent care after being found tohave elevated blood pressures and signs of left-sided facial droop.      Hospital Course by Problem:   HTN emergency due to NONCOMPLIANCE -not taking medications as prescribed -no plans to follow a low salt diet -NO SIGN of renal artery stenosis when evaluated by vascular -patient does not want to take meds and has no plans to take them when she leaves-- says her "dr" told her her pressure was ok around 250/150 -recent sleep study w/o signs of apnea Per Dr. Percival Spanish in 08/2017: CHEST PAIN: No objective evidence if ischemia. She had normal coronary arteries on cath in the past. She has had a history of chronic chest pain. Her pain currently is very atypical. Exam is unremarkable. I do not suspect an acute coronary syndrome. I do not suspect aortic dissection as there is no clinical, radiologic or historical suggestion of this. No further cardiac work up at this point.   HTN: She is refusing meds per nursing notes. This has been a significant problem. She has at high risk for imminent problems with possible stroke, MI, dissection and more chronic  progressive end organ damage. However, none of this can be immediately affected by more testing or procedures. Rather she needs to take the excellent med list as prescribed and consent to low salt heart healthy diet.  TOBACCO ABUSE: She is aware of the importance of smoking cessation.     PTSD -suspect this  is causing the majority of her issues/unwillingness to change  Chronic chest pain -cath in 2016 w/o issue  obesity Body mass index is 39.51 kg/m.  Tobacco abuse -encouraged cessation      Medical Consultants:      Discharge Exam:   Vitals:   12/22/17 1131 12/22/17 1213  BP: (!) 219/126 (!) 210/70  Pulse: 69   Resp:    Temp:  98.2 F (36.8 C)  SpO2: 97%    Vitals:   12/22/17 0400 12/22/17 0748 12/22/17 1131 12/22/17 1213  BP: (!) 159/95 (!) 213/107 (!) 219/126 (!) 210/70  Pulse: 75 84 69   Resp: 20     Temp: 98.1 F (36.7 C) 98.4 F (36.9 C)  98.2 F (36.8 C)  TempSrc: Oral Oral    SpO2:  99% 97%   Weight:      Height:        General exam: Appears calm and comfortable.  NAD Guarded at times during disussion    The results of significant diagnostics from this hospitalization (including imaging, microbiology, ancillary and laboratory) are listed below for reference.     Procedures and Diagnostic Studies:   Dg Chest 2 View  Result Date: 12/20/2017 CLINICAL DATA:  Cough, chest pain and abdominal pain. EXAM: CHEST - 2 VIEW COMPARISON:  11/23/2017 FINDINGS: The heart size and mediastinal contours are within normal limits. There is no evidence of pulmonary edema, consolidation, pneumothorax, nodule or pleural fluid. The visualized skeletal structures are unremarkable. IMPRESSION: No active cardiopulmonary disease. Electronically Signed   By: Aletta Edouard M.D.   On: 12/20/2017 16:10   Ct Head Wo Contrast  Result Date: 12/20/2017 CLINICAL DATA:  Headache of sudden onset. Hypertension left-sided facial droop. EXAM: CT HEAD WITHOUT CONTRAST TECHNIQUE: Contiguous axial images were obtained from the base of the skull through the vertex without intravenous contrast. COMPARISON:  08/20/2017 CT and MRI. FINDINGS: BRAIN: The ventricles and sulci are normal. No acute intraparenchymal hemorrhage, mass effect nor midline shift. No acute large vascular territory  infarcts. Chronic small vessel ischemic disease of periventricular white matter. Subtle bilateral posterior occipital white matter changes raise the possibility of PRES/hypertensive encephalopathy. The basal ganglia are unremarkable. No abnormal extra-axial fluid collections. Basal cisterns are not effaced and midline. The brainstem and cerebellar hemispheres are without acute abnormalities. VASCULAR: No hyperdense vessel sign. SKULL/SOFT TISSUES: No skull fracture. No significant soft tissue swelling. ORBITS/SINUSES: The included ocular globes and orbital contents are normal.The mastoid air cells are clear. The included paranasal sinuses are well-aerated. OTHER: Chronic small mastoid effusion on the right, partially opacifying the inferior right mastoid air cells. IMPRESSION: Indistinct subcortical appearing gray-white matter junctures involving the posterior occipital lobes bilaterally that may represent stigmata of hypertensive encephalopathy/PRES. Otherwise chronic small vessel ischemic disease and small right mastoid effusion. Electronically Signed   By: Ashley Royalty M.D.   On: 12/20/2017 19:29   Ct Renal Stone Study  Result Date: 12/20/2017 CLINICAL DATA:  57 y/o  F; left-sided abdominal pain. EXAM: CT ABDOMEN AND PELVIS WITHOUT CONTRAST TECHNIQUE: Multidetector CT imaging of the abdomen and pelvis was performed following the standard protocol without IV contrast. COMPARISON:  11/02/2017 CT abdomen  and pelvis FINDINGS: Lower chest: Stable 2-3 mm perifissural nodules at the lung bases, likely intrapulmonary lymph nodes. Hepatobiliary: No focal liver abnormality is seen. Status post cholecystectomy. No biliary dilatation. Pancreas: Unremarkable. No pancreatic ductal dilatation or surrounding inflammatory changes. Spleen: Normal in size without focal abnormality. Adrenals/Urinary Tract: Adrenal glands are unremarkable. Kidneys are normal, without renal calculi, focal lesion, or hydronephrosis. Bladder is  unremarkable. Stomach/Bowel: Stomach is within normal limits. Appendix not identified, no pericecal inflammation. No evidence of bowel wall thickening, distention, or inflammatory changes. Vascular/Lymphatic: Aortic atherosclerosis. No enlarged abdominal or pelvic lymph nodes. Reproductive: Status post hysterectomy. No adnexal masses. Other: No abdominal wall hernia or abnormality. No abdominopelvic ascites. Musculoskeletal: L5-S1 posterior instrumented fusion with vertical rods and paired pedicle screws as well as and interbody prosthesis. No periprosthetic lucency or fracture identified. IMPRESSION: No acute process of the abdomen or pelvis. No hydronephrosis or urinary stone disease identified. Stable CT of the abdomen and pelvis. Electronically Signed   By: Kristine Garbe M.D.   On: 12/20/2017 23:52     Labs:   Basic Metabolic Panel: Recent Labs  Lab 12/20/17 1518 12/21/17 0401  NA 142 143  K 3.8 3.7  CL 111 109  CO2 22 26  GLUCOSE 92 121*  BUN 13 15  CREATININE 0.87 0.96  CALCIUM 9.1 8.8*   GFR Estimated Creatinine Clearance: 78.7 mL/min (by C-G formula based on SCr of 0.96 mg/dL). Liver Function Tests: Recent Labs  Lab 12/20/17 1518  AST 15  ALT 14  ALKPHOS 67  BILITOT 0.4  PROT 6.7  ALBUMIN 3.6   Recent Labs  Lab 12/20/17 1613  LIPASE 29   No results for input(s): AMMONIA in the last 168 hours. Coagulation profile No results for input(s): INR, PROTIME in the last 168 hours.  CBC: Recent Labs  Lab 12/20/17 1518 12/21/17 0401  WBC 9.3 8.6  HGB 13.3 12.6  HCT 41.2 39.3  MCV 89.8 91.2  PLT 244 228   Cardiac Enzymes: Recent Labs  Lab 12/21/17 1400  TROPONINI <0.03   BNP: Invalid input(s): POCBNP CBG: No results for input(s): GLUCAP in the last 168 hours. D-Dimer No results for input(s): DDIMER in the last 72 hours. Hgb A1c No results for input(s): HGBA1C in the last 72 hours. Lipid Profile No results for input(s): CHOL, HDL, LDLCALC,  TRIG, CHOLHDL, LDLDIRECT in the last 72 hours. Thyroid function studies No results for input(s): TSH, T4TOTAL, T3FREE, THYROIDAB in the last 72 hours.  Invalid input(s): FREET3 Anemia work up No results for input(s): VITAMINB12, FOLATE, FERRITIN, TIBC, IRON, RETICCTPCT in the last 72 hours. Microbiology No results found for this or any previous visit (from the past 240 hour(s)).   Discharge Instructions:   Discharge Instructions    Diet - low sodium heart healthy   Complete by:  As directed    Diet - low sodium heart healthy   Complete by:  As directed    Discharge instructions   Complete by:  As directed    Close follow up with Dr. Bronson Ing for BP management   Discharge instructions   Complete by:  As directed    Need to stop smoking Please take your medications and follow up with Dr. Jacinta Shoe for adjustments: you are at high risk for imminent problems with possible stroke, MI, dissection and more chronic progressive end organ damage Low salt diet   Increase activity slowly   Complete by:  As directed    Increase activity slowly   Complete  by:  As directed      Allergies as of 12/22/2017      Reactions   Other Anaphylaxis, Swelling   [place]   Penicillins Anaphylaxis   Has patient had a PCN reaction causing immediate rash, facial/tongue/throat swelling, SOB or lightheadedness with hypotension: Yes Has patient had a PCN reaction causing severe rash involving mucus membranes or skin necrosis: Yes Has patient had a PCN reaction that required hospitalization Yes Has patient had a PCN reaction occurring within the last 10 years: No If all of the above answers are "NO", then may proceed with Cephalosporin use.   Zithromax [azithromycin] Anaphylaxis   Aspirin Other (See Comments)   Due to stomach ulcers.    Pineapple Rash   Strawberry Extract Rash, Hives   Aspartame And Phenylalanine Palpitations   Mushroom Extract Complex Rash   Nicardipine Nausea And Vomiting, Other  (See Comments)   shaking      Medication List    STOP taking these medications   ARIPiprazole 2 MG tablet Commonly known as:  ABILIFY   escitalopram 10 MG tablet Commonly known as:  LEXAPRO   prazosin 1 MG capsule Commonly known as:  MINIPRESS     TAKE these medications   amLODipine 10 MG tablet Commonly known as:  NORVASC Take 1 tablet (10 mg total) by mouth daily.   atenolol 25 MG tablet Commonly known as:  TENORMIN Take 1 tablet (25 mg total) by mouth daily.   chlorthalidone 25 MG tablet Commonly known as:  HYGROTON Take 1 tablet (25 mg total) by mouth daily.   clopidogrel 75 MG tablet Commonly known as:  PLAVIX Take 75 mg by mouth daily.   dexlansoprazole 60 MG capsule Commonly known as:  DEXILANT Take 1 tab by mouth every morning. What changed:    how much to take  how to take this  when to take this  additional instructions   EPIPEN 2-PAK 0.3 mg/0.3 mL Soaj injection Generic drug:  EPINEPHrine Inject into the muscle.   EXCEDRIN EXTRA STRENGTH 250-250-65 MG tablet Generic drug:  aspirin-acetaminophen-caffeine Take 2 tablets by mouth daily as needed for headache or migraine.   gabapentin 600 MG tablet Commonly known as:  NEURONTIN Take 600 mg by mouth 4 (four) times daily.   hydrALAZINE 50 MG tablet Commonly known as:  APRESOLINE Take 1 tablet (50 mg total) by mouth every 8 (eight) hours.   losartan 50 MG tablet Commonly known as:  COZAAR Take 1 tablet (50 mg total) by mouth 2 (two) times daily.   rOPINIRole 4 MG tablet Commonly known as:  REQUIP Take 4 mg by mouth at bedtime.   zolpidem 12.5 MG CR tablet Commonly known as:  AMBIEN CR Take 12.5 mg by mouth at bedtime.      Follow-up Information    Herminio Commons, MD. Schedule an appointment as soon as possible for a visit.   Specialty:  Cardiology Contact information: [place] [address] [phone]        [name], FNP Follow up in 1 week(s).     Specialty:  Family Medicine Why:  please stress compliance with meds/low salt diet Contact information: [place] [address] [phone]        Herminio Commons, MD .   Specialty:  Cardiology Contact information: [place] [address] [phone]            Time coordinating discharge: 35 min  Signed:  Geradine Girt  [facility] 12/22/2017, 2:08 PM

## 2018-01-04 IMAGING — CT CT HEAD W/O CM
4 series · 16 of 47 positions shown, 18 images · non-contrast
Comparison: Head CTs dated 04/04/2016 and 04/03/2016. Brain MRI
dated 10/25/2015.

CLINICAL DATA: Syncope, found down, right hip and leg pain. Patient
describes additional history of hypertension, TIAs and strokes.

Clinical data supplied on brain MRI report of 10/25/2015 indicated
additional history of multiple sclerosis.
EXAM:
CT HEAD WITHOUT CONTRAST
TECHNIQUE: Contiguous axial images were obtained from the base of the skull
through the vertex without intravenous contrast.

[Series 2: head trauma wo · axial · 0.39mm/px · z∈[+1532,+1642]mm · 7 of 30 slices shown, 9 images]
[im 4/30  brain]
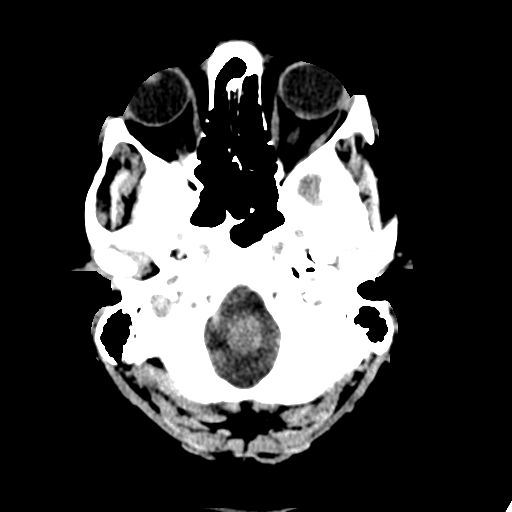
[im 4/30  bone]
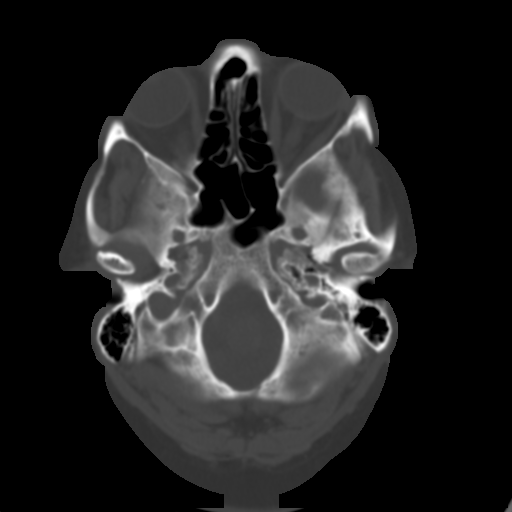
[im 8/30  brain]
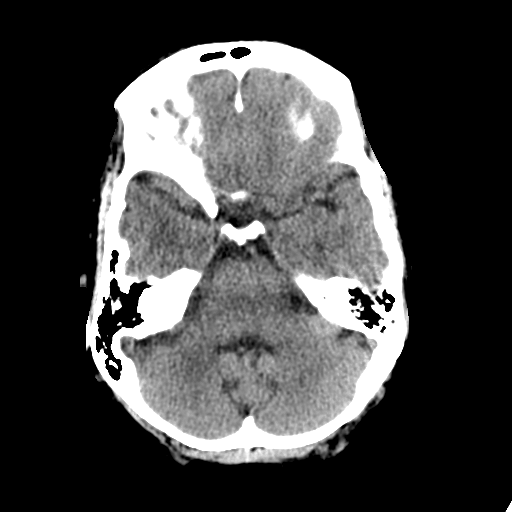
[im 11/30  brain]
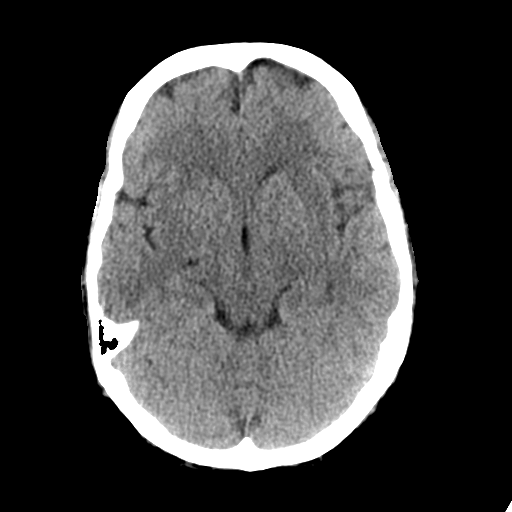
[im 15/30  brain]
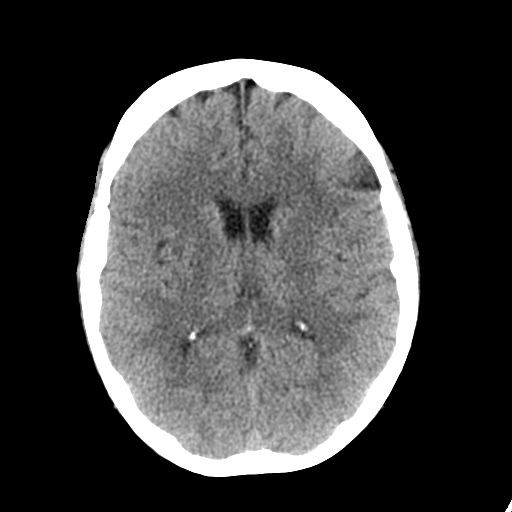
[im 19/30  brain]
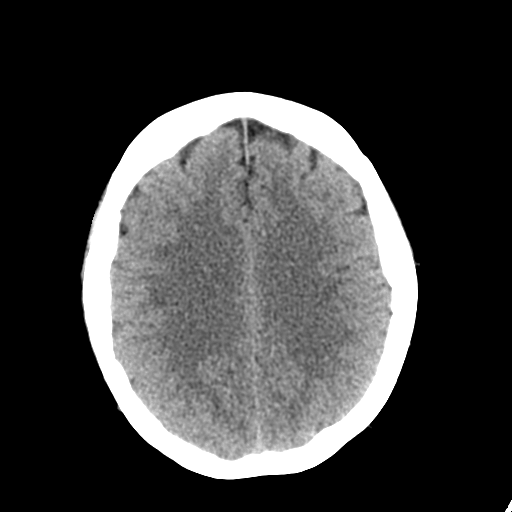
[im 19/30  bone]
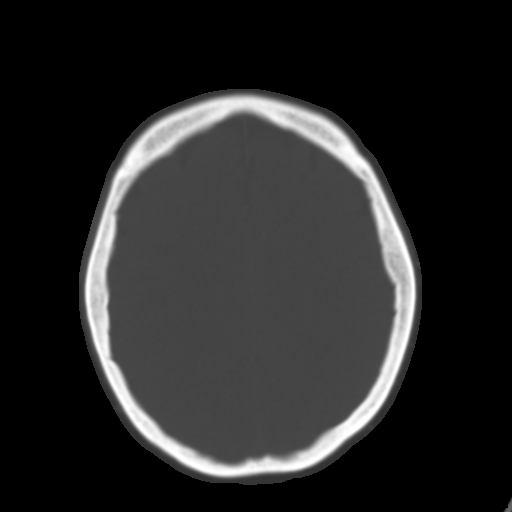
[im 22/30  brain]
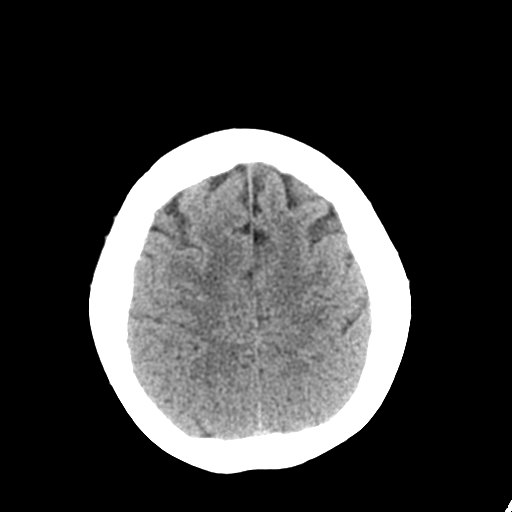
[im 26/30  brain]
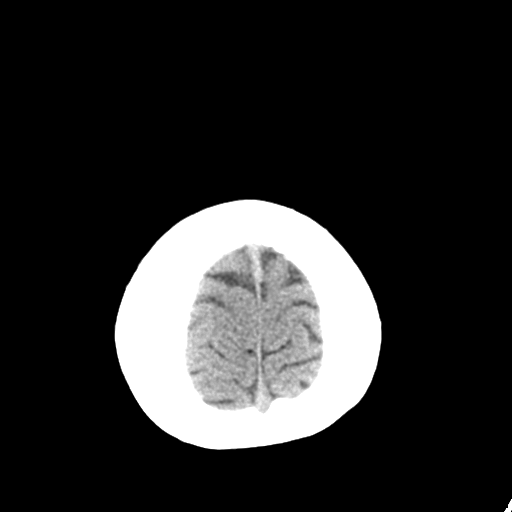

[Series 3: head bone · axial · 0.39mm/px · z∈[+1531,+1561]mm · 3 of 75 slices shown]
[im 8/75  bone]
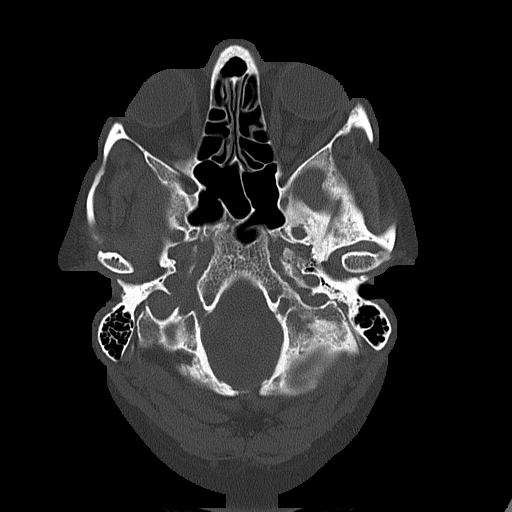
[im 15/75  bone]
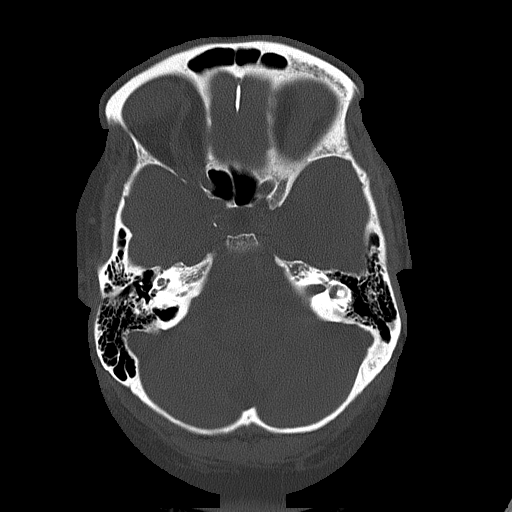
[im 23/75  bone]
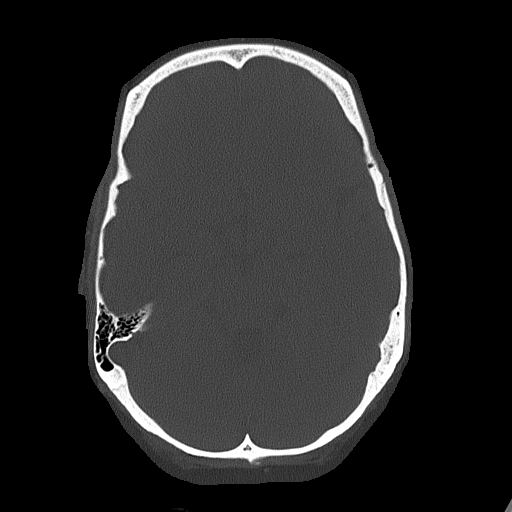

[Series 4: coronal soft tissue · coronal · 0.31mm/px · 3 of 61 slices shown]
[im 21/61  brain]
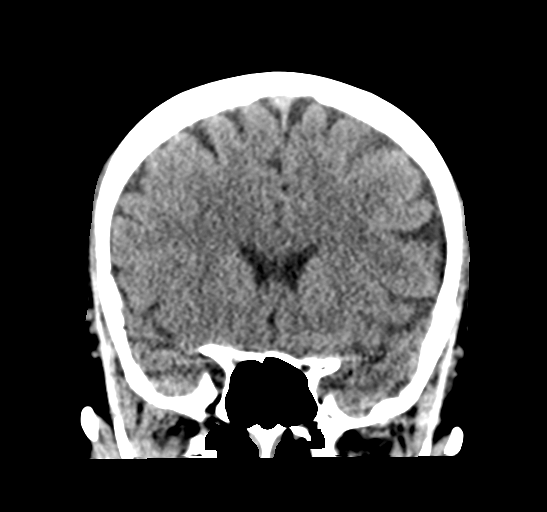
[im 27/61  brain]
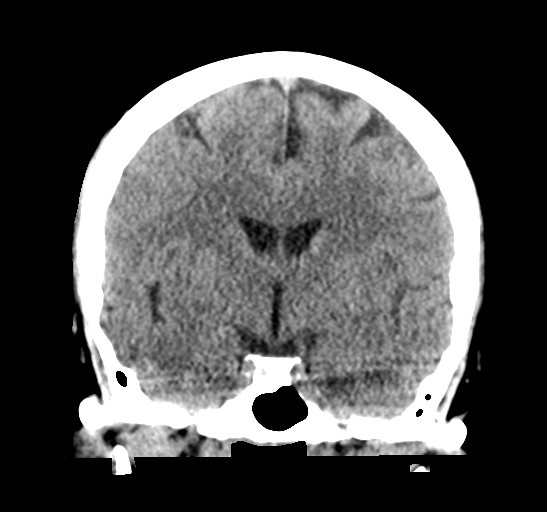
[im 34/61  brain]
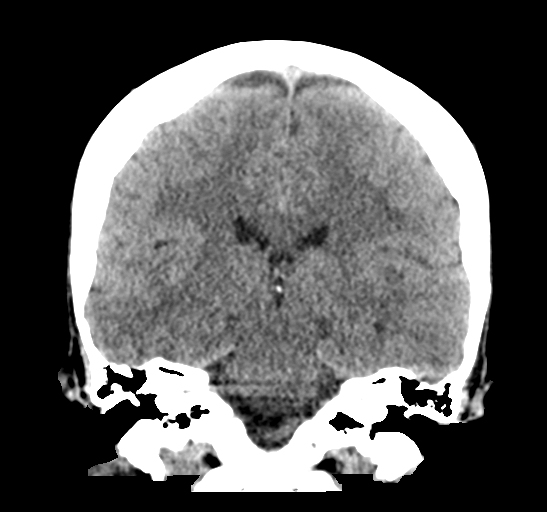

[Series 5: sagittal soft tissue · sagittal · 0.33mm/px · 3 of 51 slices shown]
[im 17/51  brain]
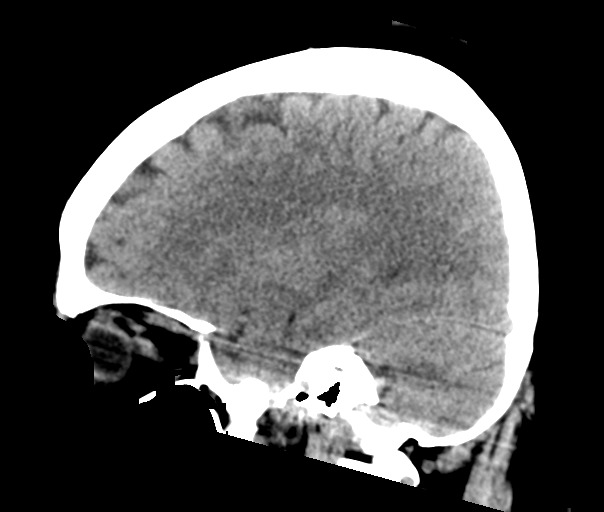
[im 26/51  brain]
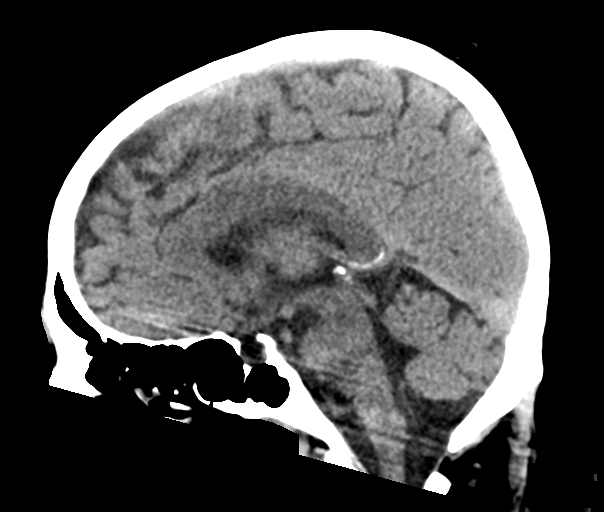
[im 34/51  brain]
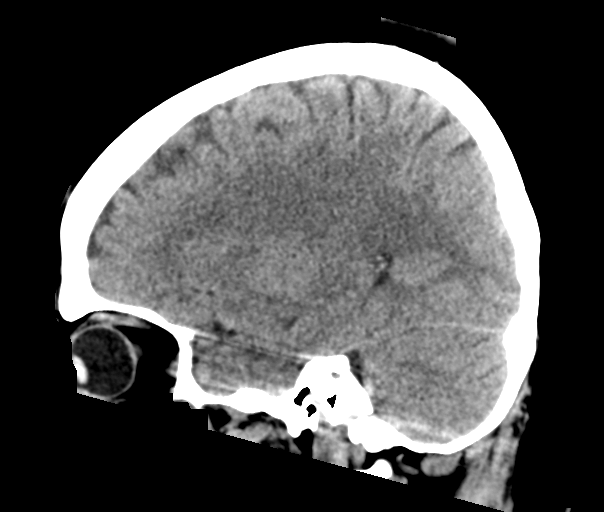

[16 of 47 positions shown; findings below may reference images not displayed]

FINDINGS: Brain: Ventricles are normal in size and configuration. There is no
mass, hemorrhage, edema or other evidence of acute parenchymal
abnormality. No extra-axial hemorrhage.

Subtle low-density foci at the interface of the subcortical white
matter an overlying cortical gray matter corresponds to the
nonspecific T2 hyperintensities shown on brain MRI of 10/25/2015,
compatible with mild chronic microvascular ischemic change, possibly
associated with the previous history suggesting multiple sclerosis.

Vascular: There are chronic calcified atherosclerotic changes of the
large vessels at the skull base. No unexpected hyperdense vessel.

Skull: Normal. Negative for fracture or focal lesion.

Sinuses/Orbits: No acute finding.

Other: None.
IMPRESSION: 1. No acute findings.  No intracranial mass, hemorrhage or edema.
2. Subtle low-density foci at the peripheral margin of the
subcortical white matter, corresponding to the nonspecific T2
hyperintensities shown on brain MRI of 10/25/2015, compatible with
mild chronic microvascular ischemic change, possibly associated with
previous history suggesting multiple sclerosis.

## 2018-01-04 IMAGING — DX DG FEMUR 2+V*R*
4 series · 4 of 4 positions shown · non-contrast
Comparison: None.

CLINICAL DATA: Fall down stairs yesterday, right hip and leg pain,
initial encounter.

EXAM:
RIGHT FEMUR 2 VIEWS

[femur ap (1 of 2)]
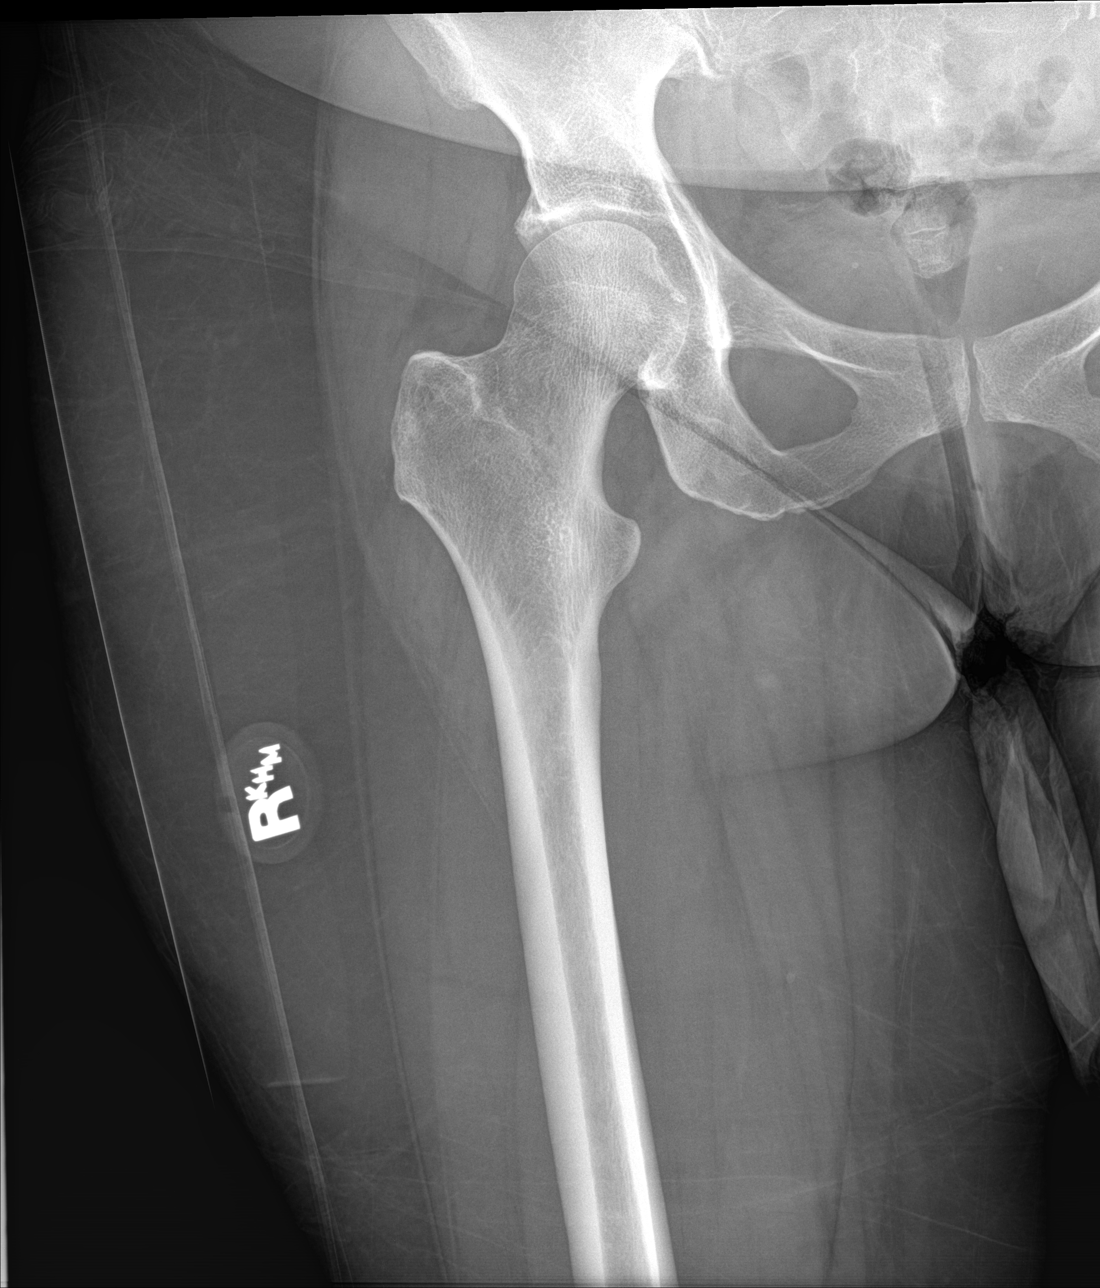

[femur lat (1 of 2)]
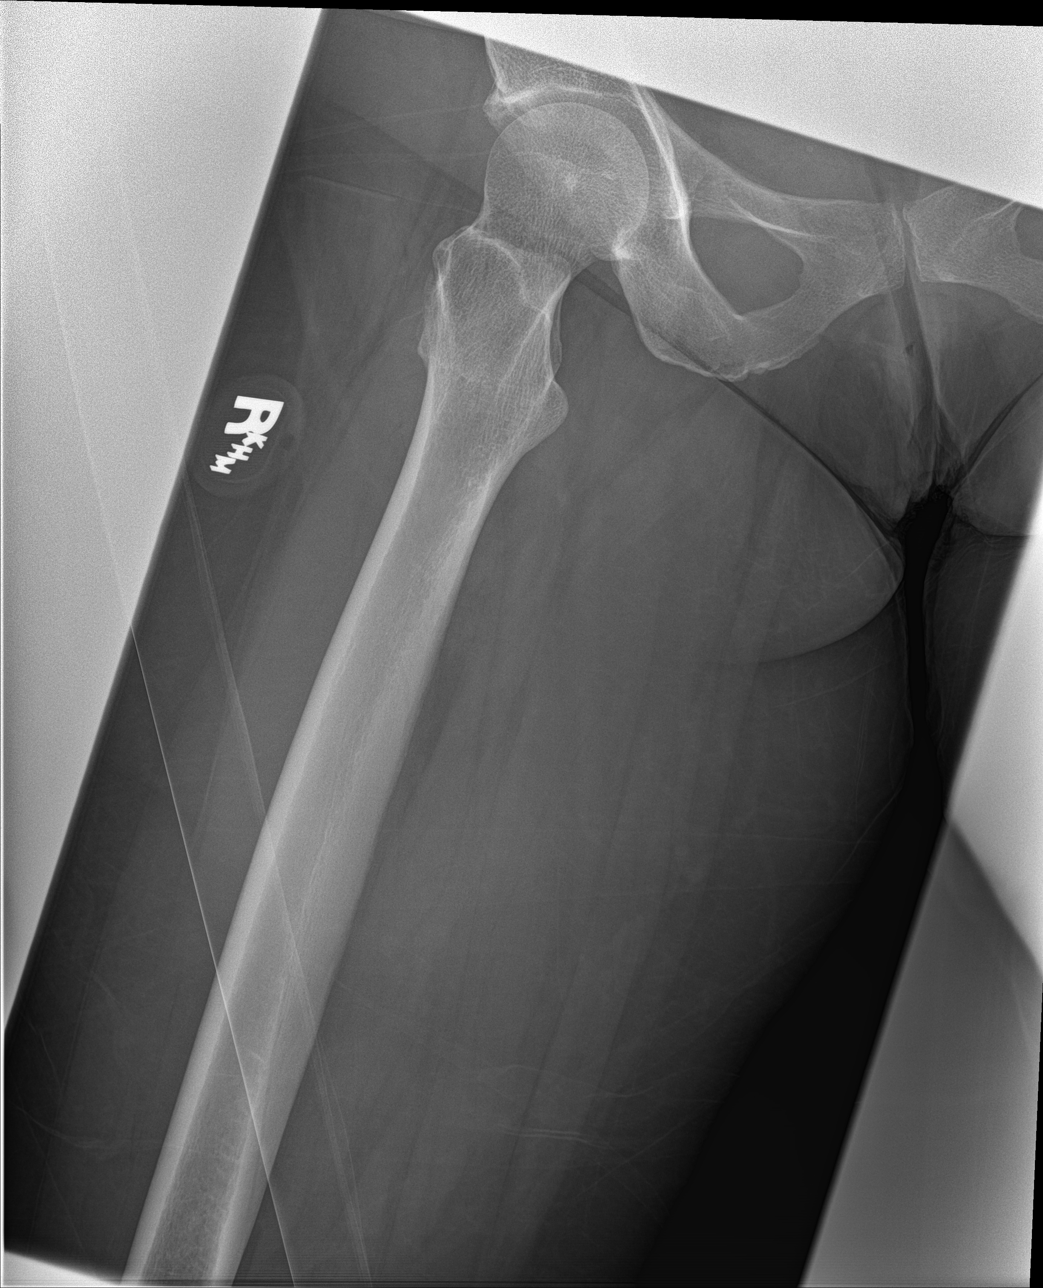

[femur ap (2 of 2)]
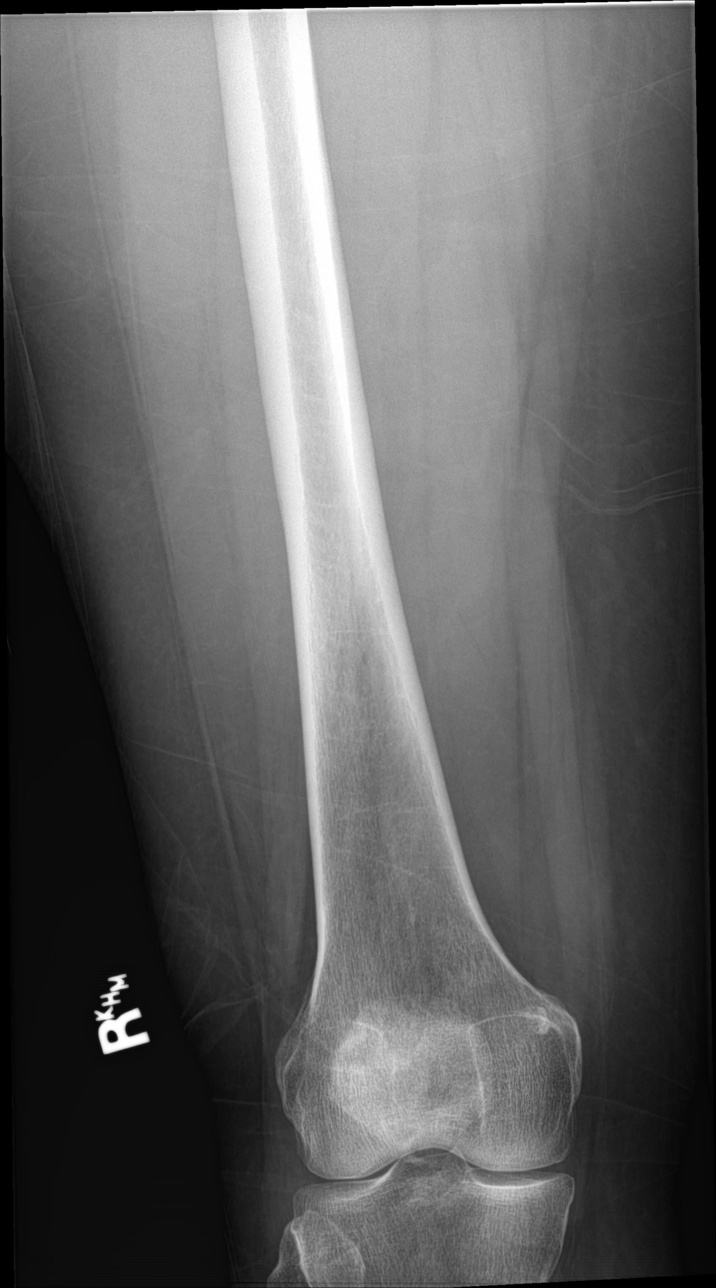

[femur lat (2 of 2)]
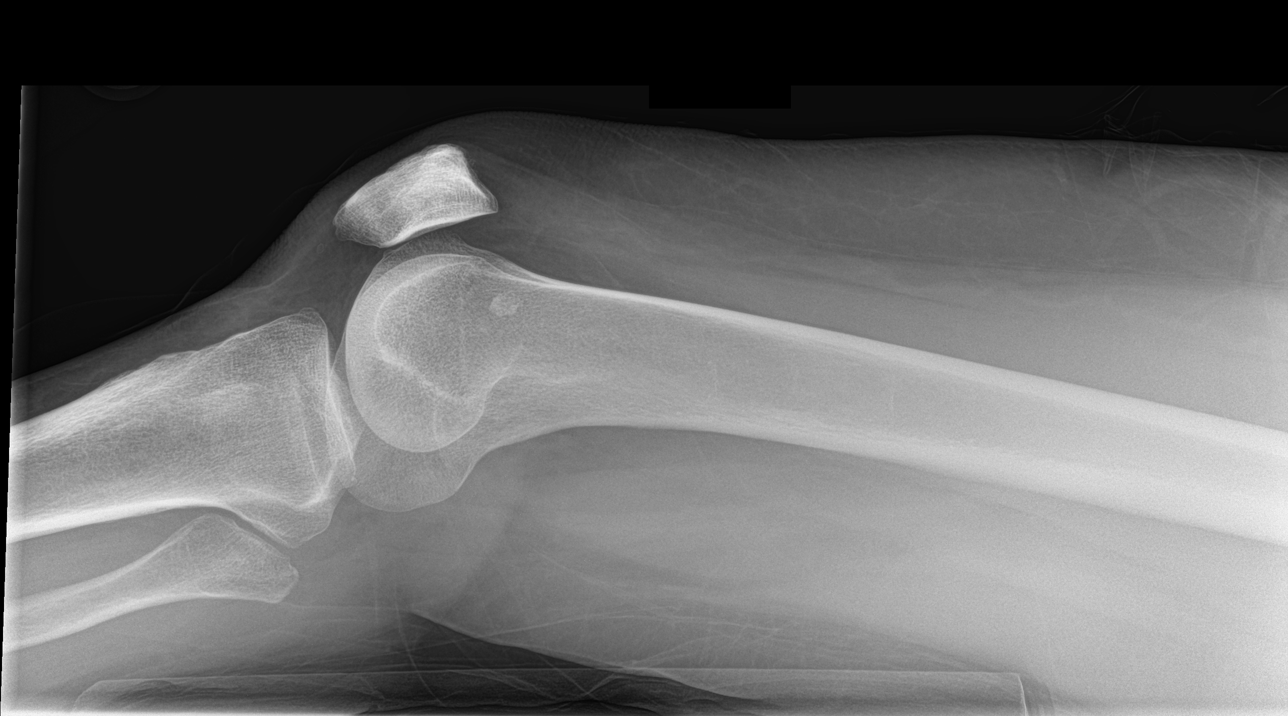

[4 of 4 positions shown; findings below may reference images not displayed]

FINDINGS: No acute osseous abnormality.  No joint effusion in the right knee.
IMPRESSION: No acute findings.

## 2018-01-04 IMAGING — DX DG HIP (WITH OR WITHOUT PELVIS) 2-3V*R*
3 series · 3 of 3 positions shown · non-contrast
Comparison: None.

CLINICAL DATA: Fell down stairs yesterday, right hip pain

EXAM:
DG HIP (WITH OR WITHOUT PELVIS) 2-3V RIGHT

[pelvis ap]
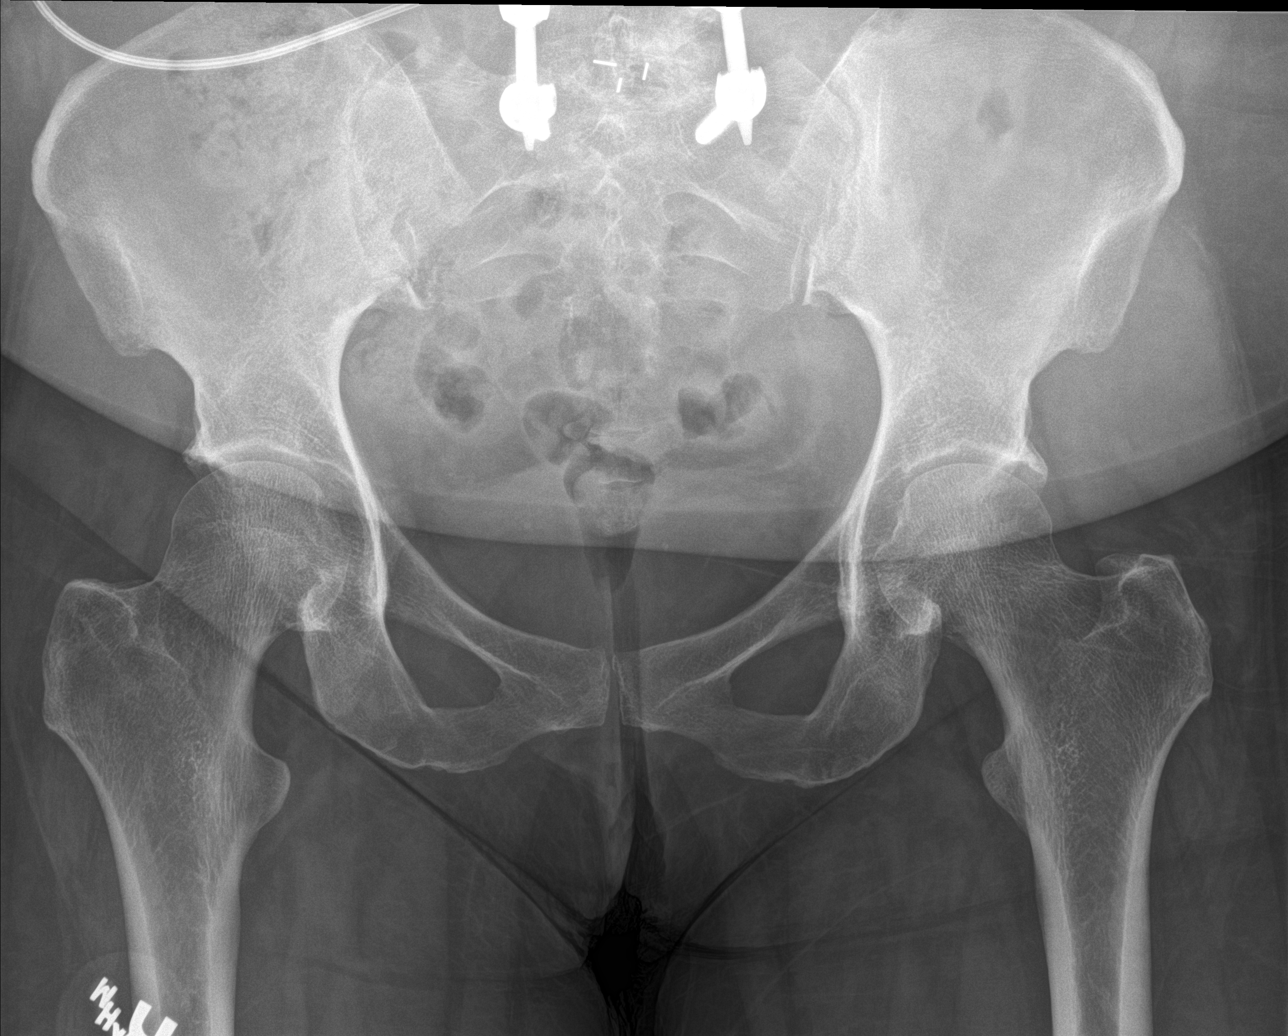

[hip ap]
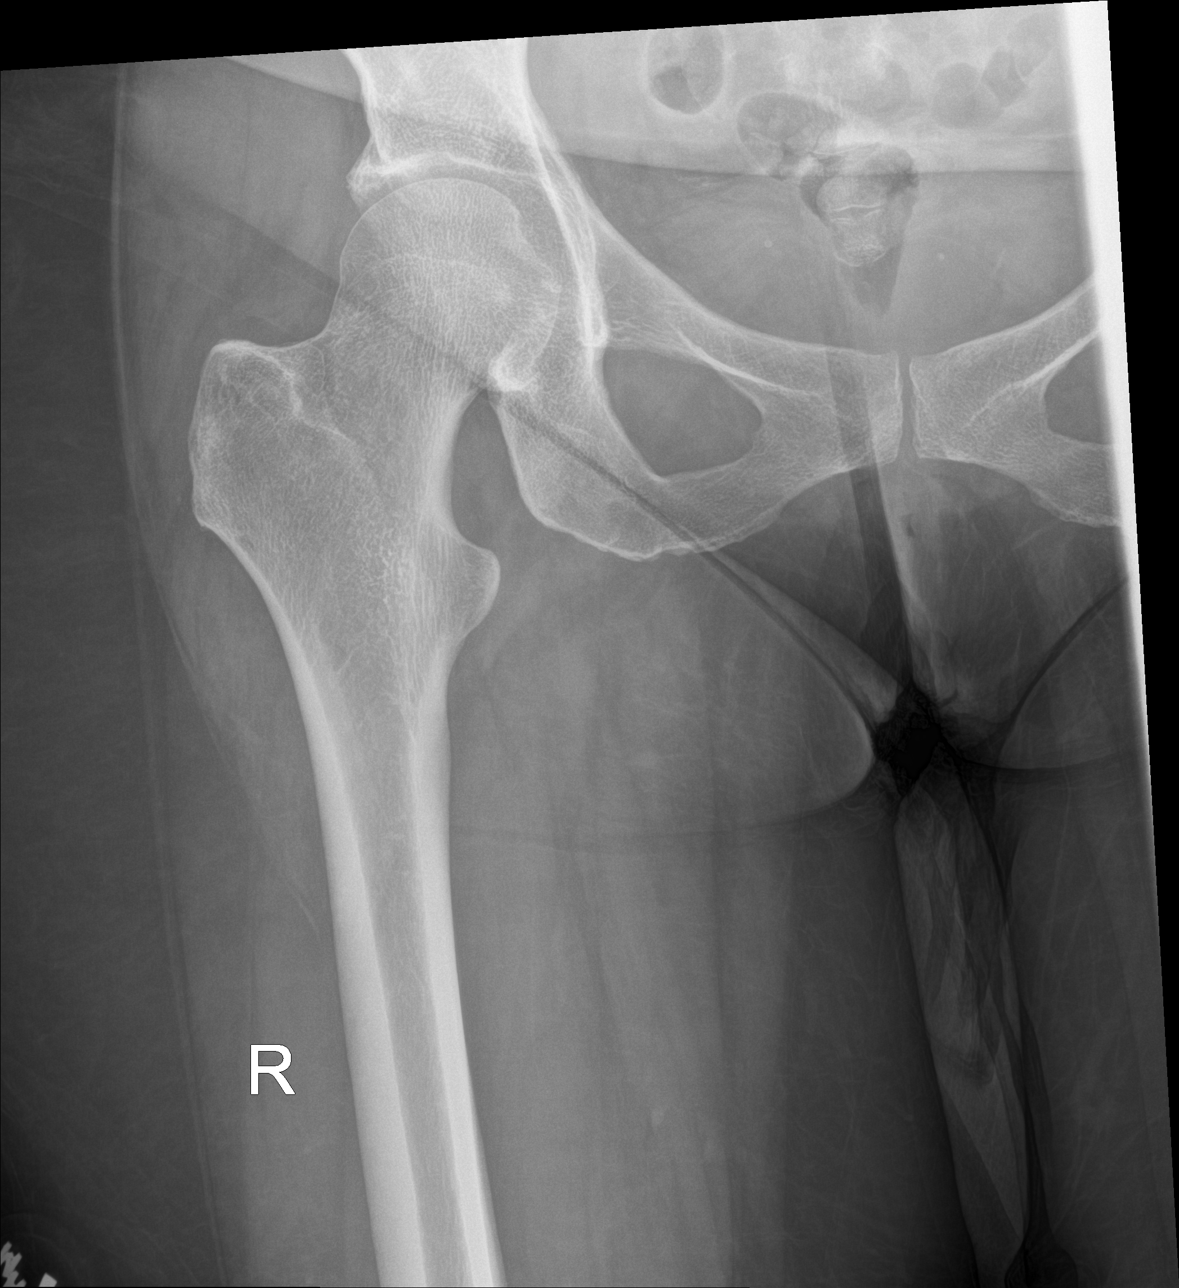

[hip lat]
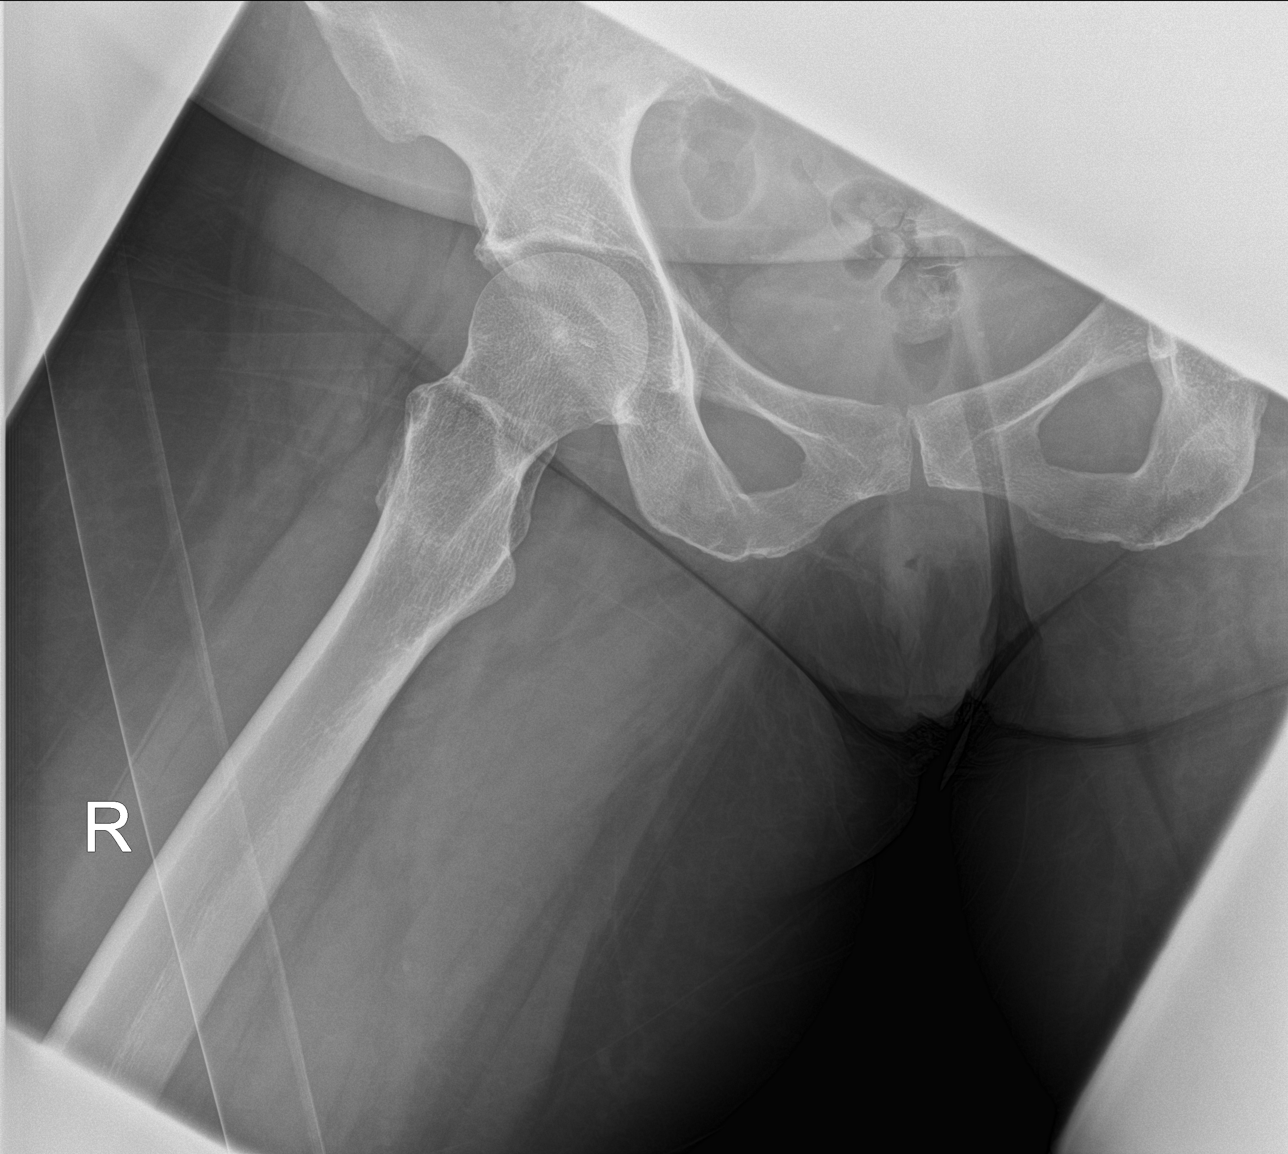

[3 of 3 positions shown; findings below may reference images not displayed]

FINDINGS: Three views of the right hip submitted. No acute fracture or
subluxation. No radiopaque foreign body.
IMPRESSION: Negative.

## 2018-01-09 ENCOUNTER — Ambulatory Visit (HOSPITAL_COMMUNITY): Payer: Medicaid Other | Admitting: Licensed Clinical Social Worker

## 2018-01-23 ENCOUNTER — Ambulatory Visit (INDEPENDENT_AMBULATORY_CARE_PROVIDER_SITE_OTHER): Payer: Medicare Other | Admitting: Licensed Clinical Social Worker

## 2018-01-23 ENCOUNTER — Encounter (HOSPITAL_COMMUNITY): Payer: Self-pay | Admitting: Licensed Clinical Social Worker

## 2018-01-23 DIAGNOSIS — F4310 Post-traumatic stress disorder, unspecified: Secondary | ICD-10-CM | POA: Diagnosis not present
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
## 2018-01-30 ENCOUNTER — Encounter: Payer: Self-pay | Admitting: *Deleted

## 2018-01-30 ENCOUNTER — Ambulatory Visit (INDEPENDENT_AMBULATORY_CARE_PROVIDER_SITE_OTHER): Payer: Medicare Other | Admitting: Cardiovascular Disease

## 2018-01-30 ENCOUNTER — Encounter: Payer: Self-pay | Admitting: Cardiovascular Disease

## 2018-01-30 VITALS — BP 190/118 | HR 75 | Ht 66.5 in | Wt 215.0 lb

## 2018-01-30 DIAGNOSIS — R079 Chest pain, unspecified: Secondary | ICD-10-CM

## 2018-01-30 DIAGNOSIS — Z01812 Encounter for preprocedural laboratory examination: Secondary | ICD-10-CM | POA: Diagnosis not present

## 2018-01-30 DIAGNOSIS — Z8673 Personal history of transient ischemic attack (TIA), and cerebral infarction without residual deficits: Secondary | ICD-10-CM

## 2018-01-30 DIAGNOSIS — I10 Essential (primary) hypertension: Secondary | ICD-10-CM

## 2018-01-30 DIAGNOSIS — Z9289 Personal history of other medical treatment: Secondary | ICD-10-CM

## 2018-01-30 MED ORDER — ISOSORBIDE MONONITRATE ER 30 MG PO TB24: 30.0000 mg | ORAL_TABLET | Freq: Every day | ORAL | 6 refills | Status: DC

## 2018-01-30 MED ORDER — HYDRALAZINE HCL 100 MG PO TABS: 100.0000 mg | ORAL_TABLET | Freq: Three times a day (TID) | ORAL | 6 refills | Status: DC

## 2018-01-30 NOTE — Patient Instructions (Signed)
Medication Instructions:   Increase Hydralazine to 100mg  three times per day.  Begin Imdur 30mg  daily.  Continue all other medications.    Labwork: BMET - do just prior to CTA - order given today.   Testing/Procedures:  Coronary CT   Office will contact with results via phone or letter.    Follow-Up: 8-10 weeks   Any Other Special Instructions Will Be Listed Below (If Applicable).  If you need a refill on your cardiac medications before your next appointment, please call your pharmacy.

## 2018-01-30 NOTE — Progress Notes (Signed)
SUBJECTIVE: The patient presents for routine follow-up.  She was hospitalized for 8 hypertensive emergency in July 2019.  It appeared secondary to noncompliance as she was not taking her medications as prescribed and refused to follow a low-salt diet.  Troponin was normal.  Head CT was suggestive of hypertensive encephalopathy.  There was otherwise chronic small vessel ischemic disease.  Chest x-ray was normal.  Echocardiogram in December 2018 demonstrated vigorous left ventricular systolic function, LVEF 65 to 70%, normal regional wall motion, grade 1 diastolic dysfunction with high ventricular filling pressures, and mild concentric and mild to moderate basal septal hypertrophy.  She was recently hospitalized in [place], [place] since being hospitalized in [place].  She had been there for vacation earlier this month.  I was able to review some of her labs dated 01/05/2018: Troponin less than 0.012, sodium 143, normal potassium, BUN 20, creatinine 0.7, white blood cells 9.1, hemoglobin 12.6, platelets 268, BNP elevated at 332.  She told me she may have had a TIA.  She had chest pain.  She has been having more frequent chest pain with increasing intensity more recently.  Blood pressure today is 190/118.  I reviewed each and every one of her medications with her and she states she is taking each 1 of them including the hydralazine 3 times daily.  She has been having increased leg and feet swelling by the end of the day.     Review of Systems: As per "subjective", otherwise negative.  Allergies  Allergen Reactions  . Other Anaphylaxis and Swelling    [place]  . Penicillins Anaphylaxis    Has patient had a PCN reaction causing immediate rash, facial/tongue/throat swelling, SOB or lightheadedness with hypotension: Yes Has patient had a PCN reaction causing severe rash involving mucus membranes or skin necrosis: Yes Has patient had a PCN reaction that required hospitalization  Yes Has patient had a PCN reaction occurring within the last 10 years: No If all of the above answers are "NO", then may proceed with Cephalosporin use.   . Zithromax [Azithromycin] Anaphylaxis  . Aspirin Other (See Comments)    Due to stomach ulcers.   . Pineapple Rash  . Strawberry Extract Rash and Hives  . Aspartame And Phenylalanine Palpitations  . Mushroom Extract Complex Rash  . Nicardipine Nausea And Vomiting and Other (See Comments)    shaking    Current Outpatient Medications  Medication Sig Dispense Refill  . amLODipine (NORVASC) 10 MG tablet Take 1 tablet (10 mg total) by mouth daily. 30 tablet 6  . aspirin-acetaminophen-caffeine (EXCEDRIN EXTRA STRENGTH) 250-250-65 MG tablet Take 2 tablets by mouth daily as needed for headache or migraine.     Marland Kitchen atenolol (TENORMIN) 25 MG tablet Take 1 tablet (25 mg total) by mouth daily. 30 tablet 0  . chlorthalidone (HYGROTON) 25 MG tablet Take 1 tablet (25 mg total) by mouth daily. 30 tablet 0  . clopidogrel (PLAVIX) 75 MG tablet Take 75 mg by mouth daily.     Marland Kitchen dexlansoprazole (DEXILANT) 60 MG capsule Take 1 tab by mouth every morning. (Patient taking differently: Take 60 mg by mouth every evening. ) 30 capsule 1  . EPINEPHrine (EPIPEN 2-PAK) 0.3 mg/0.3 mL IJ SOAJ injection Inject into the muscle.    . gabapentin (NEURONTIN) 600 MG tablet Take 600 mg by mouth 4 (four) times daily.    . hydrALAZINE (APRESOLINE) 50 MG tablet Take 1 tablet (50 mg total) by mouth every 8 (eight) hours. Mertens  tablet 0  . losartan (COZAAR) 50 MG tablet Take 1 tablet (50 mg total) by mouth 2 (two) times daily. 60 tablet 0  . rOPINIRole (REQUIP) 4 MG tablet Take 4 mg by mouth at bedtime.    Marland Kitchen zolpidem (AMBIEN CR) 12.5 MG CR tablet Take 12.5 mg by mouth at bedtime.     No current facility-administered medications for this visit.     Past Medical History:  Diagnosis Date  . Allergy   . Anemia 1975-1976   . Anxiety    takes Valium daily as needed  . Arthritis     "spine" (12/03/2016)  . Asthma    has inhalers but doesn't use (12/03/2016)  . Chronic bronchitis ([place])    "get it a couple times q yr" (12/03/2016)  . Chronic kidney disease   . Chronic lower back pain    budlging disc   . Claustrophobia   . COPD (chronic obstructive pulmonary disease) ([place])   . Daily headache   . Depression   . Diverticulitis   . Family history of adverse reaction to anesthesia    2 daughters gets extremely sick   . GERD (gastroesophageal reflux disease)    takes Dexilant daily  . Heart murmur   . History of blood transfusion 1975-1976 "several"   no abnormal reaction noted  . History of colitis   . History of colon polyps    benign  . History of gastric ulcer   . History of hiatal hernia    "small one"  . History of MRSA infection 2017  . Hyperlipidemia    was on meds but has been off over a yr  . Hypertension    takes Amlodipine and Maxzide daily  . Insomnia    takes Ambien nightly  . Iron deficiency anemia    "when I was young"  . Lung nodules   . Migraine    "2-3/wk" (12/03/2016)  . MS (multiple sclerosis) ([place])    questionable per pt  . Noncompliance   . Osteoporosis   . Peripheral neuropathy    weakness,numbness,and tingling. Takes Gabapentin daily  . PTSD (post-traumatic stress disorder) dx'd 2016/2017   "was dx'd w/bipolar in 1991; replaced w/PTSD dx 2016/2017" (12/03/2016)  . Restless leg syndrome    takes Requip daily  . 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 Type II diabetes mellitus ([place])    "went off insulin in 2012/2013" (12/03/2016)    Past Surgical History:  Procedure Laterality Date  . ABDOMINAL AORTOGRAM N/A 12/03/2016   Procedure: Abdominal Aortogram;  Surgeon: Angelia Mould, MD;  Location: [facility];  Service: Cardiovascular;  Laterality: N/A;  . ABDOMINAL HYSTERECTOMY  12/1987  . ADENOIDECTOMY  1975  . ANKLE SURGERY  Bilateral 1993; 1995 X2   "stabilzation done; 1 on the right; 2 on the left"  . APPENDECTOMY  1989  . BREAST EXCISIONAL BIOPSY Left   . BREAST LUMPECTOMY Left    "benign tumor"  . CARDIAC CATHETERIZATION N/A 01/31/2015   Procedure: Left Heart Cath and Coronary Angiography;  Surgeon: Burnell Blanks, MD;  Location: [facility];  Service: Cardiovascular;  Laterality: N/A;  . COLONOSCOPY    . DILATION AND CURETTAGE OF UTERUS    . ESOPHAGOGASTRODUODENOSCOPY    . LAPAROSCOPIC CHOLECYSTECTOMY  1996  . NASAL RECONSTRUCTION  1976  . NASAL SINUS SURGERY  1975  . POSTERIOR LUMBAR FUSION  2005  . RADIOLOGY WITH ANESTHESIA N/A 01/15/2016   Procedure: MRI LUMBAR SPINE WITHOUT;  Surgeon: Medication Radiologist, MD;  Location: [facility];  Service: Radiology;  Laterality: N/A;  . RADIOLOGY WITH ANESTHESIA N/A 06/29/2016   Procedure: MRI OF THE BRAIN WITH AND WITHOUT;  Surgeon: Medication Radiologist, MD;  Location: [facility];  Service: Radiology;  Laterality: N/A;  . RENAL ANGIOGRAPHY N/A 12/03/2016   Procedure: Renal Angiography;  Surgeon: Angelia Mould, MD;  Location: [facility];  Service: Cardiovascular;  Laterality: N/A;  . TONSILLECTOMY  1992  . TUBAL LIGATION    . 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 Marital status: Divorced    Spouse name: Not on file  . Number of children: 3  . Years of education: Some college  . Highest education level: Not on file  Occupational History    Comment: Disability for back pain  Social Needs  . Financial resource strain: Not on file  . Food insecurity:    Worry: Not on file    Inability: Not on file  . Transportation needs:    Medical: Not on file    Non-medical: Not on file  Tobacco Use  . Smoking status: Current Every Day Smoker    Packs/day: 1.50    Years: 45.00    Pack years: 67.50    Types: Cigarettes    Start date:  03/04/1971  . Smokeless tobacco: Never Used  Substance and Sexual Activity  . Alcohol use: No    Alcohol/week: 0.0 standard drinks  . Drug use: No  . Sexual activity: Never    Birth control/protection: Surgical  Lifestyle  . Physical activity:    Days per week: Not on file    Minutes per session: Not on file  . Stress: Not on file  Relationships  . Social connections:    Talks on phone: Not on file    Gets together: Not on file    Attends religious service: Not on file    Active member of club or organization: Not on file    Attends meetings of clubs or organizations: Not on file    Relationship status: Not on file  . Intimate partner violence:    Fear of current or ex partner: Not on file    Emotionally abused: Not on file    Physically abused: Not on file    Forced sexual activity: Not on file  Other Topics Concern  . Not on file  Social History Narrative   Lives at home with her daughter.   Right-handed.   1-2 cups coffee in the morning and 2 sodas per day.     Vitals:   01/30/18 0848  BP: (!) 190/118  Pulse: 75  SpO2: 97%  Weight: 215 lb (97.5 kg)  Height: 5' 6.5" (1.689 m)    Wt Readings from Last 3 Encounters:  01/30/18 215 lb (97.5 kg)  12/21/17 220 lb (99.8 kg)  12/05/17 217 lb (98.4 kg)     PHYSICAL EXAM General: NAD HEENT: Normal. Neck: No JVD, no thyromegaly. Lungs: Clear to auscultation bilaterally with normal respiratory effort. CV: Regular rate and rhythm, normal S1/S2, no S3/S4, no murmur. No pretibial or periankle edema.  No carotid bruit.   Abdomen: Soft, nontender, no distention.  Neurologic: Alert and oriented.  Psych: Normal affect. Skin: Normal. Musculoskeletal: No gross  deformities.    ECG: Reviewed above under Subjective   Labs: Lab Results  Component Value Date/Time   K 3.7 12/21/2017 04:01 AM   BUN 15 12/21/2017 04:01 AM   CREATININE 0.96 12/21/2017 04:01 AM   CREATININE 0.92 10/25/2017 01:28 PM   ALT 14 12/20/2017  03:18 PM   TSH 2.522 08/21/2017 05:25 AM   TSH 3.630 03/20/2014 12:37 AM   HGB 12.6 12/21/2017 04:01 AM     Lipids: Lab Results  Component Value Date/Time   LDLCALC 82 08/21/2017 05:25 AM   CHOL 140 08/21/2017 05:25 AM   TRIG 142 08/21/2017 05:25 AM   HDL 30 (L) 08/21/2017 05:25 AM       ASSESSMENT AND PLAN: 1.  Malignant hypertension with endorgan damage (history of stroke): Blood pressure is significantly elevated.  She was hospitalized in July with hypertensive emergency due to medication and dietary noncompliance.  She was then hospitalized earlier this month in [place].  She claims to be compliant with medications.  I will increase hydralazine to 100 mg 3 times daily and also add Imdur 30 mg daily.  2.  History of CVA: She is on Plavix.  3.  Chest pain: She had angiographically normal coronary arteries in August 2016.  She has been having increasing frequency and intensity of chest pain.  While it is likely due to malignant hypertension, I want to make certain she has not developed ischemic heart disease.  I will obtain coronary CT angiography.   Disposition: Follow up 8-10 weeks with APP  Time spent: 40 minutes, of which greater than 50% was spent reviewing symptoms, relevant blood tests and studies, and discussing management plan with the patient.    Kate Sable, M.D., F.A.C.C.
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
## 2018-02-05 ENCOUNTER — Other Ambulatory Visit: Payer: Self-pay

## 2018-02-05 ENCOUNTER — Encounter (HOSPITAL_COMMUNITY): Payer: Self-pay | Admitting: *Deleted

## 2018-02-05 ENCOUNTER — Observation Stay (HOSPITAL_COMMUNITY)
Admission: EM | Admit: 2018-02-05 | Discharge: 2018-02-09 | Disposition: A | Discharge status: Home / Self Care | Payer: Medicare Other | Attending: Internal Medicine | Admitting: Internal Medicine

## 2018-02-05 DIAGNOSIS — R4189 Other symptoms and signs involving cognitive functions and awareness: Secondary | ICD-10-CM | POA: Diagnosis present

## 2018-02-05 DIAGNOSIS — I6521 Occlusion and stenosis of right carotid artery: Secondary | ICD-10-CM | POA: Diagnosis present

## 2018-02-05 DIAGNOSIS — G3184 Mild cognitive impairment, so stated: Secondary | ICD-10-CM | POA: Insufficient documentation

## 2018-02-05 DIAGNOSIS — F411 Generalized anxiety disorder: Secondary | ICD-10-CM | POA: Diagnosis present

## 2018-02-05 DIAGNOSIS — F17210 Nicotine dependence, cigarettes, uncomplicated: Secondary | ICD-10-CM | POA: Diagnosis not present

## 2018-02-05 DIAGNOSIS — F4322 Adjustment disorder with anxiety: Secondary | ICD-10-CM | POA: Diagnosis not present

## 2018-02-05 DIAGNOSIS — N189 Chronic kidney disease, unspecified: Secondary | ICD-10-CM | POA: Insufficient documentation

## 2018-02-05 DIAGNOSIS — F4329 Adjustment disorder with other symptoms: Secondary | ICD-10-CM

## 2018-02-05 DIAGNOSIS — F332 Major depressive disorder, recurrent severe without psychotic features: Secondary | ICD-10-CM | POA: Insufficient documentation

## 2018-02-05 DIAGNOSIS — G2581 Restless legs syndrome: Secondary | ICD-10-CM | POA: Diagnosis present

## 2018-02-05 DIAGNOSIS — Z79899 Other long term (current) drug therapy: Secondary | ICD-10-CM | POA: Insufficient documentation

## 2018-02-05 DIAGNOSIS — R42 Dizziness and giddiness: Secondary | ICD-10-CM

## 2018-02-05 DIAGNOSIS — I129 Hypertensive chronic kidney disease with stage 1 through stage 4 chronic kidney disease, or unspecified chronic kidney disease: Secondary | ICD-10-CM | POA: Insufficient documentation

## 2018-02-05 DIAGNOSIS — Z720 Tobacco use: Secondary | ICD-10-CM | POA: Diagnosis present

## 2018-02-05 DIAGNOSIS — I160 Hypertensive urgency: Principal | ICD-10-CM | POA: Diagnosis present

## 2018-02-05 DIAGNOSIS — R45851 Suicidal ideations: Secondary | ICD-10-CM

## 2018-02-05 DIAGNOSIS — E1140 Type 2 diabetes mellitus with diabetic neuropathy, unspecified: Secondary | ICD-10-CM | POA: Diagnosis not present

## 2018-02-05 DIAGNOSIS — F4310 Post-traumatic stress disorder, unspecified: Secondary | ICD-10-CM | POA: Diagnosis not present

## 2018-02-05 DIAGNOSIS — Z8673 Personal history of transient ischemic attack (TIA), and cerebral infarction without residual deficits: Secondary | ICD-10-CM | POA: Diagnosis not present

## 2018-02-05 DIAGNOSIS — I161 Hypertensive emergency: Secondary | ICD-10-CM

## 2018-02-05 DIAGNOSIS — I6930 Unspecified sequelae of cerebral infarction: Secondary | ICD-10-CM

## 2018-02-05 DIAGNOSIS — R51 Headache: Secondary | ICD-10-CM | POA: Diagnosis present

## 2018-02-05 DIAGNOSIS — I5189 Other ill-defined heart diseases: Secondary | ICD-10-CM

## 2018-02-05 DIAGNOSIS — J449 Chronic obstructive pulmonary disease, unspecified: Secondary | ICD-10-CM | POA: Diagnosis not present

## 2018-02-05 DIAGNOSIS — Z91199 Patient's noncompliance with other medical treatment and regimen due to unspecified reason: Secondary | ICD-10-CM

## 2018-02-05 DIAGNOSIS — Z9119 Patient's noncompliance with other medical treatment and regimen: Secondary | ICD-10-CM | POA: Insufficient documentation

## 2018-02-05 DIAGNOSIS — I10 Essential (primary) hypertension: Secondary | ICD-10-CM | POA: Diagnosis present

## 2018-02-05 DIAGNOSIS — R0989 Other specified symptoms and signs involving the circulatory and respiratory systems: Secondary | ICD-10-CM

## 2018-02-05 NOTE — ED Triage Notes (Signed)
Pt c/o headache with dizziness x one day; pt states she has some sob that is new

## 2018-02-06 ENCOUNTER — Other Ambulatory Visit: Payer: Self-pay

## 2018-02-06 ENCOUNTER — Encounter (HOSPITAL_COMMUNITY): Payer: Self-pay | Admitting: Internal Medicine

## 2018-02-06 ENCOUNTER — Emergency Department (HOSPITAL_COMMUNITY): Payer: Medicare Other

## 2018-02-06 DIAGNOSIS — I161 Hypertensive emergency: Secondary | ICD-10-CM | POA: Diagnosis not present

## 2018-02-06 DIAGNOSIS — I160 Hypertensive urgency: Secondary | ICD-10-CM | POA: Diagnosis not present

## 2018-02-06 LAB — GLUCOSE, CAPILLARY
GLUCOSE-CAPILLARY: 124 mg/dL — AB (ref 70–99)
GLUCOSE-CAPILLARY: 113 mg/dL — AB (ref 70–99)
GLUCOSE-CAPILLARY: 130 mg/dL — AB (ref 70–99)
Glucose-Capillary: 157 mg/dL — ABNORMAL HIGH (ref 70–99)

## 2018-02-06 LAB — COMPREHENSIVE METABOLIC PANEL
ALT: 15 U/L (ref 0–44)
AST: 15 U/L (ref 15–41)
Albumin: 3.9 g/dL (ref 3.5–5.0)
Alkaline Phosphatase: 75 U/L (ref 38–126)
Anion gap: 8 (ref 5–15)
BILIRUBIN TOTAL: 0.3 mg/dL (ref 0.3–1.2)
BUN: 18 mg/dL (ref 6–20)
CALCIUM: 9 mg/dL (ref 8.9–10.3)
CO2: 26 mmol/L (ref 22–32)
CREATININE: 0.81 mg/dL (ref 0.44–1.00)
Chloride: 109 mmol/L (ref 98–111)
GFR calc Af Amer: >60 mL/min (ref >60)
GFR calc non Af Amer: >60 mL/min (ref >60)
GLUCOSE: 119 mg/dL — AB (ref 70–99)
Potassium: 3.7 mmol/L (ref 3.5–5.1)
Sodium: 143 mmol/L (ref 135–145)
TOTAL PROTEIN: 7.1 g/dL (ref 6.5–8.1)

## 2018-02-06 LAB — CBC WITH DIFFERENTIAL/PLATELET
BASOS ABS: 0 10*3/uL (ref 0.0–0.1)
Basophils Relative: 0 %
EOS ABS: 0.2 10*3/uL (ref 0.0–0.7)
EOS PCT: 2 %
HCT: 40.9 % (ref 36.0–46.0)
Hemoglobin: 13.3 g/dL (ref 12.0–15.0)
Lymphocytes Relative: 47 %
Lymphs Abs: 5.3 10*3/uL — ABNORMAL HIGH (ref 0.7–4.0)
MCH: 29.4 pg (ref 26.0–34.0)
MCHC: 32.5 g/dL (ref 30.0–36.0)
MCV: 90.3 fL (ref 78.0–100.0)
Monocytes Absolute: 0.8 10*3/uL (ref 0.1–1.0)
Monocytes Relative: 7 %
Neutro Abs: 4.9 10*3/uL (ref 1.7–7.7)
Neutrophils Relative %: 44 %
PLATELETS: 241 10*3/uL (ref 150–400)
RBC: 4.53 MIL/uL (ref 3.87–5.11)
RDW: 14.6 % (ref 11.5–15.5)
WBC: 11.2 10*3/uL — AB (ref 4.0–10.5)

## 2018-02-06 LAB — TROPONIN I
Troponin I: <0.03 ng/mL (ref <0.03)
Troponin I: <0.03 ng/mL (ref <0.03)
Troponin I: <0.03 ng/mL (ref <0.03)

## 2018-02-06 LAB — HEMOGLOBIN A1C
Hgb A1c MFr Bld: 6 % — ABNORMAL HIGH (ref 4.8–5.6)
MEAN PLASMA GLUCOSE: 125.5 mg/dL

## 2018-02-06 LAB — MAGNESIUM: Magnesium: 2.2 mg/dL (ref 1.7–2.4)

## 2018-02-06 LAB — BRAIN NATRIURETIC PEPTIDE: B Natriuretic Peptide: 30 pg/mL (ref 0.0–100.0)

## 2018-02-06 LAB — PHOSPHORUS: Phosphorus: 2.9 mg/dL (ref 2.5–4.6)

## 2018-02-06 LAB — MRSA PCR SCREENING: MRSA BY PCR: NEGATIVE

## 2018-02-06 MED ORDER — ZOLPIDEM TARTRATE 5 MG PO TABS
5.0000 mg | ORAL_TABLET | Freq: Every evening | ORAL | Status: DC | PRN
  Administered 2018-02-06 – 2018-02-08 (×3): 5 mg via ORAL
  Filled 2018-02-06 (×3): qty 1

## 2018-02-06 MED ORDER — CLOPIDOGREL BISULFATE 75 MG PO TABS
75.0000 mg | ORAL_TABLET | Freq: Every day | ORAL | Status: DC
  Administered 2018-02-06 – 2018-02-08 (×3): 75 mg via ORAL
  Filled 2018-02-06 (×3): qty 1

## 2018-02-06 MED ORDER — NITROGLYCERIN IN D5W 200-5 MCG/ML-% IV SOLN
5.0000 ug/min | Freq: Once | INTRAVENOUS | Status: DC
  Filled 2018-02-06: qty 250

## 2018-02-06 MED ORDER — LOSARTAN POTASSIUM 50 MG PO TABS
50.0000 mg | ORAL_TABLET | Freq: Two times a day (BID) | ORAL | Status: DC
  Administered 2018-02-06 – 2018-02-08 (×4): 50 mg via ORAL
  Filled 2018-02-06 (×4): qty 1

## 2018-02-06 MED ORDER — FENTANYL CITRATE (PF) 100 MCG/2ML IJ SOLN
50.0000 ug | Freq: Once | INTRAMUSCULAR | Status: AC
  Administered 2018-02-06: 50 ug via INTRAVENOUS
  Filled 2018-02-06: qty 2

## 2018-02-06 MED ORDER — METOPROLOL SUCCINATE ER 50 MG PO TB24
100.0000 mg | ORAL_TABLET | Freq: Every day | ORAL | Status: DC
  Administered 2018-02-06 – 2018-02-07 (×2): 100 mg via ORAL
  Filled 2018-02-06 (×2): qty 2
  Filled 2018-02-06: qty 4

## 2018-02-06 MED ORDER — PANTOPRAZOLE SODIUM 40 MG PO TBEC
40.0000 mg | DELAYED_RELEASE_TABLET | Freq: Every day | ORAL | Status: DC
  Administered 2018-02-06 – 2018-02-08 (×3): 40 mg via ORAL
  Filled 2018-02-06 (×3): qty 1

## 2018-02-06 MED ORDER — NICOTINE 21 MG/24HR TD PT24
21.0000 mg | MEDICATED_PATCH | Freq: Every day | TRANSDERMAL | Status: DC
  Administered 2018-02-06 – 2018-02-08 (×3): 21 mg via TRANSDERMAL
  Filled 2018-02-06 (×4): qty 1

## 2018-02-06 MED ORDER — ONDANSETRON HCL 4 MG PO TABS
4.0000 mg | ORAL_TABLET | Freq: Four times a day (QID) | ORAL | Status: DC | PRN
  Administered 2018-02-08: 4 mg via ORAL
  Filled 2018-02-06: qty 1

## 2018-02-06 MED ORDER — ASPIRIN-ACETAMINOPHEN-CAFFEINE 250-250-65 MG PO TABS
2.0000 | ORAL_TABLET | Freq: Every day | ORAL | Status: DC | PRN
  Filled 2018-02-06: qty 2

## 2018-02-06 MED ORDER — HYDRALAZINE HCL 20 MG/ML IJ SOLN
20.0000 mg | Freq: Once | INTRAMUSCULAR | Status: AC
  Administered 2018-02-06: 20 mg via INTRAVENOUS
  Filled 2018-02-06: qty 1

## 2018-02-06 MED ORDER — HEPARIN SODIUM (PORCINE) 5000 UNIT/ML IJ SOLN
5000.0000 [IU] | Freq: Three times a day (TID) | INTRAMUSCULAR | Status: DC
  Filled 2018-02-06 (×4): qty 1

## 2018-02-06 MED ORDER — GABAPENTIN 300 MG PO CAPS
600.0000 mg | ORAL_CAPSULE | Freq: Four times a day (QID) | ORAL | Status: DC
  Administered 2018-02-06 – 2018-02-08 (×10): 600 mg via ORAL
  Filled 2018-02-06 (×10): qty 2

## 2018-02-06 MED ORDER — HYDRALAZINE HCL 20 MG/ML IJ SOLN
10.0000 mg | Freq: Once | INTRAMUSCULAR | Status: AC
  Administered 2018-02-06: 10 mg via INTRAVENOUS
  Filled 2018-02-06: qty 1

## 2018-02-06 MED ORDER — ONDANSETRON HCL 4 MG/2ML IJ SOLN
4.0000 mg | Freq: Once | INTRAMUSCULAR | Status: AC
  Administered 2018-02-06: 4 mg via INTRAVENOUS
  Filled 2018-02-06: qty 2

## 2018-02-06 MED ORDER — LABETALOL HCL 5 MG/ML IV SOLN
20.0000 mg | Freq: Once | INTRAVENOUS | Status: AC
  Administered 2018-02-06: 20 mg via INTRAVENOUS
  Filled 2018-02-06: qty 4

## 2018-02-06 MED ORDER — ONDANSETRON HCL 4 MG/2ML IJ SOLN
4.0000 mg | Freq: Four times a day (QID) | INTRAMUSCULAR | Status: DC | PRN
  Administered 2018-02-07: 4 mg via INTRAVENOUS
  Filled 2018-02-06: qty 2

## 2018-02-06 MED ORDER — HYDRALAZINE HCL 25 MG PO TABS
100.0000 mg | ORAL_TABLET | Freq: Three times a day (TID) | ORAL | Status: DC
  Administered 2018-02-06 – 2018-02-08 (×7): 100 mg via ORAL
  Filled 2018-02-06 (×8): qty 4

## 2018-02-06 MED ORDER — AMLODIPINE BESYLATE 5 MG PO TABS
10.0000 mg | ORAL_TABLET | Freq: Every day | ORAL | Status: DC
  Administered 2018-02-06 – 2018-02-08 (×3): 10 mg via ORAL
  Filled 2018-02-06 (×3): qty 2

## 2018-02-06 MED ORDER — CHLORTHALIDONE 25 MG PO TABS
25.0000 mg | ORAL_TABLET | Freq: Every day | ORAL | Status: DC
  Administered 2018-02-06 – 2018-02-07 (×2): 25 mg via ORAL
  Filled 2018-02-06 (×2): qty 1

## 2018-02-06 MED ORDER — ROPINIROLE HCL 1 MG PO TABS
4.0000 mg | ORAL_TABLET | Freq: Every day | ORAL | Status: DC
  Administered 2018-02-06 – 2018-02-08 (×3): 4 mg via ORAL
  Filled 2018-02-06 (×3): qty 4

## 2018-02-06 MED ORDER — LORAZEPAM 2 MG/ML IJ SOLN
0.7500 mg | Freq: Once | INTRAMUSCULAR | Status: AC
  Administered 2018-02-06: 0.75 mg via INTRAVENOUS
  Filled 2018-02-06: qty 1

## 2018-02-06 MED ORDER — ACETAMINOPHEN 325 MG PO TABS: 650.0000 mg | ORAL_TABLET | Freq: Four times a day (QID) | ORAL | Status: DC | PRN

## 2018-02-06 MED ORDER — ISOSORBIDE MONONITRATE ER 60 MG PO TB24
30.0000 mg | ORAL_TABLET | Freq: Every day | ORAL | Status: DC
  Administered 2018-02-06 – 2018-02-07 (×2): 30 mg via ORAL
  Filled 2018-02-06 (×2): qty 1

## 2018-02-06 MED ORDER — ACETAMINOPHEN 650 MG RE SUPP: 650.0000 mg | Freq: Four times a day (QID) | RECTAL | Status: DC | PRN

## 2018-02-06 NOTE — H&P (Signed)
History and Physical    Katrina [name] LPF:[id number] DOB: 09-26-1960 DOA: 02/05/2018  PCP: [name], FNP   Patient coming from: Home.  I have personally briefly reviewed patient's old medical records in [place]  Chief Complaint: Headache and dizziness  HPI: Katrina [name] is a 57 y.o. female with medical history significant of allergy, anemia, anxiety, osteoarthritis of the spine, COPD, chronic kidney disease, chronic lower back pain, claustrophobia, depression, insomnia, PTSD, restless leg syndrome, GERD, type 2 diabetes, lung nodules, tobacco use, history of multiple TIAs, medication and medical treatment noncompliance who is coming to the emergency department with complaints of headache and dizziness since Saturday.  She has also had dyspnea and non-radiated, pressure-like chest pain associated with palpitations.  No PND or orthopnea, but complains of occasional pitting edema of the lower extremities.  She denies fever, chills, sore throat, but has occasional wheezing and frequent productive cough.  Denies abdominal pain, vomiting, constipation.  However has felt nauseous and has had some diarrhea over the weekend.  Denies dysuria, frequency or hematuria.  Denies polyuria, polydipsia or blurred vision.  ED Course: Initial vital signs in the emergency department temperature 98.1 F, pulse of 101, respirations 25, blood pressure 225/112 mmHg and O2 sat 97% on room air.  She received labetalol 20 mg IVP and hydralazine 10 mg IVP.  EKG was sinus rhythm with borderline prolonged QT interval and baseline wander.  Troponin was normal.  BNP was 30.0 pg/mL.  Her CBC showed a white count of 11.2 with 44% neutrophils and 27% lymphocytes.Hemoglobin 13.3 and platelets of 241.  Her CMP was normal except for glucose of 119 mg/dL.  Her chest radiograph showed bronchitic changes of the lungs.  CT head did not show any acute intracranial normality.  Review of Systems: As per HPI otherwise 10  point review of systems negative.   Past Medical History:  Diagnosis Date  . Allergy   . Anemia 1975-1976   . Anxiety    takes Valium daily as needed  . Arthritis    "spine" (12/03/2016)  . Asthma    has inhalers but doesn't use (12/03/2016)  . Chronic bronchitis ([place])    "get it a couple times q yr" (12/03/2016)  . Chronic kidney disease   . Chronic lower back pain    budlging disc   . Claustrophobia   . COPD (chronic obstructive pulmonary disease) ([place])   . Daily headache   . Depression   . Diverticulitis   . Family history of adverse reaction to anesthesia    2 daughters gets extremely sick   . GERD (gastroesophageal reflux disease)    takes Dexilant daily  . Heart murmur   . History of blood transfusion 1975-1976 "several"   no abnormal reaction noted  . History of colitis   . History of colon polyps    benign  . History of gastric ulcer   . History of hiatal hernia    "small one"  . History of MRSA infection 2017  . Hyperlipidemia    was on meds but has been off over a yr  . Hypertension    takes Amlodipine and Maxzide daily  . Insomnia    takes Ambien nightly  . Iron deficiency anemia    "when I was young"  . Lung nodules   . Migraine    "2-3/wk" (12/03/2016)  . MS (multiple sclerosis) ([place])    questionable per pt  . Noncompliance   . Osteoporosis   .  Peripheral neuropathy    weakness,numbness,and tingling. Takes Gabapentin daily  . PTSD (post-traumatic stress disorder) dx'd 2016/2017   "was dx'd w/bipolar in 1991; replaced w/PTSD dx 2016/2017" (12/03/2016)  . Restless leg syndrome    takes Requip daily  . 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 Type II diabetes mellitus ([place])    "went off insulin in 2012/2013" (12/03/2016)    Past Surgical History:  Procedure Laterality Date  . ABDOMINAL AORTOGRAM N/A 12/03/2016   Procedure: Abdominal Aortogram;  Surgeon: Angelia Mould, MD;  Location: [place] [facility];  Service: Cardiovascular;  Laterality: N/A;  . ABDOMINAL HYSTERECTOMY  12/1987  . ADENOIDECTOMY  1975  . ANKLE SURGERY Bilateral 1993; 1995 X2   "stabilzation done; 1 on the right; 2 on the left"  . APPENDECTOMY  1989  . BREAST EXCISIONAL BIOPSY Left   . BREAST LUMPECTOMY Left    "benign tumor"  . CARDIAC CATHETERIZATION N/A 01/31/2015   Procedure: Left Heart Cath and Coronary Angiography;  Surgeon: Burnell Blanks, MD;  Location: [place] [facility];  Service: Cardiovascular;  Laterality: N/A;  . COLONOSCOPY    . DILATION AND CURETTAGE OF UTERUS    . ESOPHAGOGASTRODUODENOSCOPY    . LAPAROSCOPIC CHOLECYSTECTOMY  1996  . NASAL RECONSTRUCTION  1976  . NASAL SINUS SURGERY  1975  . POSTERIOR LUMBAR FUSION  2005  . RADIOLOGY WITH ANESTHESIA N/A 01/15/2016   Procedure: MRI LUMBAR SPINE WITHOUT;  Surgeon: Medication Radiologist, MD;  Location: [place];  Service: Radiology;  Laterality: N/A;  . RADIOLOGY WITH ANESTHESIA N/A 06/29/2016   Procedure: MRI OF THE BRAIN WITH AND WITHOUT;  Surgeon: Medication Radiologist, MD;  Location: [place];  Service: Radiology;  Laterality: N/A;  . RENAL ANGIOGRAPHY N/A 12/03/2016   Procedure: Renal Angiography;  Surgeon: Angelia Mould, MD;  Location: [place] [facility];  Service: Cardiovascular;  Laterality: N/A;  . TONSILLECTOMY  1992  . TUBAL LIGATION    . TUMOR EXCISION  1998   from back of skull; developed;  MRSA from the area that had to be packed     reports that she has been smoking cigarettes. She started smoking about 46 years ago. She has a 67.50 pack-year smoking history. She has never used smokeless tobacco. She reports that she does not drink alcohol or use drugs.  Allergies  Allergen Reactions  . Other Anaphylaxis and Swelling    [place]  . Penicillins Anaphylaxis    Has patient had a PCN reaction causing immediate rash, facial/tongue/throat swelling, SOB or lightheadedness with  hypotension: Yes Has patient had a PCN reaction causing severe rash involving mucus membranes or skin necrosis: Yes Has patient had a PCN reaction that required hospitalization Yes Has patient had a PCN reaction occurring within the last 10 years: No If all of the above answers are "NO", then may proceed with Cephalosporin use.   . Zithromax [Azithromycin] Anaphylaxis  . Aspirin Other (See Comments)    Due to stomach ulcers.   . Pineapple Rash  . Strawberry Extract Rash and Hives  . Aspartame And Phenylalanine Palpitations  . Mushroom Extract Complex Rash  . Nicardipine Nausea And Vomiting and Other (See Comments)    shaking    Family History  Problem Relation Age of Onset  . Coronary artery disease Father   . Emphysema Father   . Heart attack Father 80  Died age 31  . Stroke Father   . Cancer Father        Unsure of type   . Allergic rhinitis Father   . Depression Mother   . Skin cancer Mother   . Bipolar disorder Brother   . Drug abuse Brother   . Leukemia Brother   . Bipolar disorder Daughter   . Allergic rhinitis Daughter   . Asthma Daughter   . Urticaria Daughter   . Diabetes Maternal Grandmother   . Stomach cancer Maternal Grandfather   . Heart attack Sister        Died in 2s  . Heart attack Brother        Died age 56  . Stomach cancer Other   . Liver disease Cousin   . Angioedema Neg Hx   . Atopy Neg Hx   . Eczema Neg Hx   . Immunodeficiency Neg Hx   . Breast cancer Neg Hx   . Colon cancer Neg Hx     Prior to Admission medications   Medication Sig Start Date End Date Taking? Authorizing Provider  amLODipine (NORVASC) 10 MG tablet Take 1 tablet (10 mg total) by mouth daily. 06/05/14   Herminio Commons, MD  aspirin-acetaminophen-caffeine (EXCEDRIN EXTRA STRENGTH) [phone] MG tablet Take 2 tablets by mouth daily as needed for headache or migraine.     [provider]  atenolol (TENORMIN) 25 MG tablet Take 1 tablet (25 mg total) by mouth  daily. 12/22/17   Geradine Girt, DO  chlorthalidone (HYGROTON) 25 MG tablet Take 1 tablet (25 mg total) by mouth daily. 12/22/17   Geradine Girt, DO  clopidogrel (PLAVIX) 75 MG tablet Take 75 mg by mouth daily.     [provider]  dexlansoprazole (DEXILANT) 60 MG capsule Take 1 tab by mouth every morning. Patient taking differently: Take 60 mg by mouth every evening.  10/18/16   Esterwood, Amy S, PA-C  EPINEPHrine (EPIPEN 2-PAK) 0.3 mg/0.3 mL IJ SOAJ injection Inject into the muscle.    [provider]  gabapentin (NEURONTIN) 600 MG tablet Take 600 mg by mouth 4 (four) times daily.    [provider]  hydrALAZINE (APRESOLINE) 100 MG tablet Take 1 tablet (100 mg total) by mouth 3 (three) times daily. 01/30/18   Herminio Commons, MD  isosorbide mononitrate (IMDUR) 30 MG 24 hr tablet Take 1 tablet (30 mg total) by mouth daily. 01/30/18 04/30/18  Herminio Commons, MD  losartan (COZAAR) 50 MG tablet Take 1 tablet (50 mg total) by mouth 2 (two) times daily. 12/22/17   Geradine Girt, DO  rOPINIRole (REQUIP) 4 MG tablet Take 4 mg by mouth at bedtime.    [provider]  zolpidem (AMBIEN CR) 12.5 MG CR tablet Take 12.5 mg by mouth at bedtime. 05/16/17   [provider]    Physical Exam: Vitals:   02/06/18 0330 02/06/18 0430 02/06/18 0534 02/06/18 0600  BP: (!) 165/81 (!) 177/83 (!) 181/100 (!) 182/91  Pulse: 81 71 71 72  Resp: (!) 26 17 (!) 33 (!) 22  Temp:    98.3 F (36.8 C)  TempSrc:    Oral  SpO2: 95% 100% 99% 99%  Weight:    98.4 kg  Height:    5\' 4"  (1.626 m)    Constitutional: NAD, calm, comfortable Eyes: PERRL, lids and conjunctivae normal ENMT: Mucous membranes are moist. Posterior pharynx clear of any exudate or lesions. Neck: normal, supple, no masses, no  thyromegaly Respiratory: clear to auscultation bilaterally, no wheezing, no crackles. Normal respiratory effort. No accessory muscle use.  Cardiovascular: Regular rate and  rhythm, 2/6 systolic murmur, no rubs / gallops. No extremity edema. 2+ pedal pulses. No carotid bruits.  Abdomen: Obese, soft, no tenderness, no masses palpated. No hepatosplenomegaly. Bowel sounds positive.  Musculoskeletal: no clubbing / cyanosis. Good ROM, no contractures. Normal muscle tone.  Skin: Multiple hyperpigmented lesions on back (recommended for her to go to dermatology on a regular basis given her family history of skin cancer) Neurologic: CN 2-12 grossly intact. Sensation intact, DTR normal.  4-1/2 over 5 left-sided hemiparesis.  Per patient, this is at her baseline. Psychiatric:  Alert and oriented x 3. Mildly anxius mood.  She was questioning her medical treatment.   Labs on Admission: I have personally reviewed following labs and imaging studies  CBC: Recent Labs  Lab 02/06/18 0029  WBC 11.2*  NEUTROABS 4.9  HGB 13.3  HCT 40.9  MCV 90.3  PLT 536   Basic Metabolic Panel: Recent Labs  Lab 02/06/18 0029  NA 143  K 3.7  CL 109  CO2 26  GLUCOSE 119*  BUN 18  CREATININE 0.81  CALCIUM 9.0  MG 2.2  PHOS 2.9   GFR: Estimated Creatinine Clearance: 88.4 mL/min (by C-G formula based on SCr of 0.81 mg/dL). Liver Function Tests: Recent Labs  Lab 02/06/18 0029  AST 15  ALT 15  ALKPHOS 75  BILITOT 0.3  PROT 7.1  ALBUMIN 3.9   No results for input(s): LIPASE, AMYLASE in the last 168 hours. No results for input(s): AMMONIA in the last 168 hours. Coagulation Profile: No results for input(s): INR, PROTIME in the last 168 hours. Cardiac Enzymes: Recent Labs  Lab 02/06/18 0029  TROPONINI <0.03   BNP (last 3 results) No results for input(s): PROBNP in the last 8760 hours. HbA1C: No results for input(s): HGBA1C in the last 72 hours. CBG: No results for input(s): GLUCAP in the last 168 hours. Lipid Profile: No results for input(s): CHOL, HDL, LDLCALC, TRIG, CHOLHDL, LDLDIRECT in the last 72 hours. Thyroid Function Tests: No results for input(s): TSH,  T4TOTAL, FREET4, T3FREE, THYROIDAB in the last 72 hours. Anemia Panel: No results for input(s): VITAMINB12, FOLATE, FERRITIN, TIBC, IRON, RETICCTPCT in the last 72 hours. Urine analysis:    Component Value Date/Time   COLORURINE YELLOW 11/02/2017 1557   APPEARANCEUR HAZY (A) 11/02/2017 1557   LABSPEC 1.025 12/20/2017 1241   PHURINE 6.0 12/20/2017 1241   GLUCOSEU NEGATIVE 12/20/2017 1241   HGBUR MODERATE (A) 12/20/2017 1241   BILIRUBINUR NEGATIVE 12/20/2017 1241   KETONESUR NEGATIVE 12/20/2017 1241   PROTEINUR NEGATIVE 12/20/2017 1241   UROBILINOGEN 0.2 12/20/2017 1241   NITRITE NEGATIVE 12/20/2017 1241   LEUKOCYTESUR NEGATIVE 12/20/2017 1241    Radiological Exams on Admission: Dg Chest 2 View  Result Date: 02/06/2018 CLINICAL DATA:  Headache and dizziness x1 day. New dyspnea. Positive smoking history. EXAM: CHEST - 2 VIEW COMPARISON:  None. FINDINGS: Hyperinflated lungs with likely smoking related bronchitic change. Heart and mediastinal contours are normal in size. No aneurysm. No acute osseous abnormality. Chronic tapered appearance of the distal right clavicle at the [facility] joint. IMPRESSION: Bronchitic change of the lungs.  Mild pulmonary hyperinflation. Electronically Signed   By: Ashley Royalty M.D.   On: 02/06/2018 01:02   Ct Head Wo Contrast  Result Date: 02/06/2018 CLINICAL DATA:  Headache and dizziness x1 day. EXAM: CT HEAD WITHOUT CONTRAST TECHNIQUE: Contiguous axial images were obtained  from the base of the skull through the vertex without intravenous contrast. COMPARISON:  12/20/2017 FINDINGS: Brain: Slightly heterogeneous appearance of the occipital lobes at the gray-white matter juncture is similar to prior that can be seen with hypertensive posterior reversible encephalopathy syndrome. Findings are similar to that prior exam. No acute intracranial hemorrhage, midline shift or edema. No hydrocephalus. Chronic minimal small vessel ischemic disease is noted. Midline fourth ventricle  and basal cisterns. No intra-axial mass nor extra-axial fluid collections. Midline fourth ventricle and basal cisterns without effacement. Cerebellum and brainstem are nonacute. Vascular: No hyperdense vessel sign. Skull: Acute skull fracture or suspicious osseous lesions. Sinuses/Orbits: Intact Other: None IMPRESSION: Slightly heterogeneous appearance of the occipital lobes may reflect posterior reversible encephalopathy/hypertensive encephalopathy similar prior. Minimal small vessel ischemic disease. No acute intracranial abnormality. Electronically Signed   By: Ashley Royalty M.D.   On: 02/06/2018 01:07    EKG: Independently reviewed.  Vent. rate 93 BPM PR interval * ms QRS duration 98 ms QT/QTc 400/498 ms P-R-T axes 77 40 50 Sinus rhythm Consider left atrial enlargement Borderline prolonged QT interval Baseline wander in lead(s) II aVF V3 V5  Assessment/Plan Principal Problem:   Hypertensive emergency Admit to telemetry/inpatient. Much better after a dose of hydralazine and labetalol. Nitroglycerin infusion did not had to be started. Resume or continue amlodipine 10 mg p.o. daily. Resume or continue losartan 50 mg p.o. twice daily. Monitor blood pressure closely and adjust therapy as needed.  I suspect medication noncompliance. She has not started the atenolol or the chlorthalidone recommended by cardiology. She was complaining about taking too many pills and may not be inclined to take these.  Active Problems:   Chest pain Trend troponin levels. Continue Plavix Start beta-blocker. Needs to quit smoking. Follow-up with cardiology as scheduled.      COPD (chronic obstructive pulmonary disease) (HCC) Supplemental oxygen and bronchodilators as needed.    GERD (gastroesophageal reflux disease) Protonix 40 mg by mouth daily.    Tobacco abuse The patient stated that she has been smoking for over 40 years. She is not interested in ceasing smoking at this time. Nicotine  replacement therapy offered. Smoking cessation information provided.    Diastolic dysfunction Compensated at this time. Start chlorthalidone 25 mg p.o. daily as recommended by cardiology. Start metoprolol ER 100 mg p.o. daily.      CAD (coronary artery disease) Continue clopidogrel 75 mg by mouth daily. Start beta-blocker. Advised to cease smoking.    History of CVA (cerebral vascular accident) ([place]) Continue daily Plavix. Needs better long-term blood pressure control. Advised the patient about smoking cessation.    Generalized anxiety disorder   PTSD (post-traumatic stress disorder) Currently not taking medical therapy. She has an an appointment with psych tomorrow. Follow-up with counseling every 2 weeks as scheduled.    Type 2 diabetes mellitus with diabetic neuropathy (HCC) Hemoglobin A1c was 5.8% on 04/04/2016. Carbohydrate modified diet. CBG monitoring with regular insulin sliding scale. Continue or resume gabapentin 600 mg by mouth 4 times a day.    Restless leg syndrome Continue or resume Requip 4 mg po at bedtime.       DVT prophylaxis: Heparin SQ. Code Status:.  Full code. Family Communication:  Disposition Plan: Observation for blood pressure control. Consults called:  Admission status: Observation/telemetry.   Reubin Milan MD [facility] Pager [phone].  If 7PM-7AM, please contact night-coverage [email or web address] Password [facility]  02/06/2018, 6:56 AM

## 2018-02-06 NOTE — ED Provider Notes (Signed)
[facility] EMERGENCY DEPARTMENT Provider Note   CSN: [id number] Arrival date & time: 02/05/18  2310     History   Chief Complaint Chief Complaint  Patient presents with  . Dizziness    HPI Katrina [name] is a 57 y.o. female.  Patient presents with intermittent frontal headache and dizziness that is worse with position change.  Onset 1 day ago.  Symptoms started during the day on August 31.  She reports spinning sensation that is worse with position change and movement.  This comes and goes lasting for a few minutes at a time.  She is also been noticing intermittent left-sided chest pain that radiates to her neck.  This lasts for about a minute as well before resolving on its own.  Has had some increased shortness of breath and nausea.  Patient has chronic left-sided weakness from previous stroke and does not think this is unchanged.  No difficulty speaking or difficulty swallowing.  Has visual loss in her left periphery which is also unchanged.  States compliance with her blood pressure medications though she is very hypertensive on arrival today.  Denies any chest pain currently.  Denies any change in her chronic left-sided weakness.  Reports the dizziness is worse when she tries to change position or stand and she is feeling off balance though she was able to drive herself to the ED.  Saw her cardiologist last week and had Imdur added to her blood pressure regimen which she is compliant with.  The history is provided by the patient.  Dizziness  Associated symptoms: chest pain, headaches, shortness of breath and weakness   Associated symptoms: no nausea and no vomiting     Past Medical History:  Diagnosis Date  . Allergy   . Anemia 1975-1976   . Anxiety    takes Valium daily as needed  . Arthritis    "spine" (12/03/2016)  . Asthma    has inhalers but doesn't use (12/03/2016)  . Chronic bronchitis ([place])    "get it a couple times q yr" (12/03/2016)  . Chronic kidney disease   .  Chronic lower back pain    budlging disc   . Claustrophobia   . COPD (chronic obstructive pulmonary disease) ([place])   . Daily headache   . Depression   . Diverticulitis   . Family history of adverse reaction to anesthesia    2 daughters gets extremely sick   . GERD (gastroesophageal reflux disease)    takes Dexilant daily  . Heart murmur   . History of blood transfusion 1975-1976 "several"   no abnormal reaction noted  . History of colitis   . History of colon polyps    benign  . History of gastric ulcer   . History of hiatal hernia    "small one"  . History of MRSA infection 2017  . Hyperlipidemia    was on meds but has been off over a yr  . Hypertension    takes Amlodipine and Maxzide daily  . Insomnia    takes Ambien nightly  . Iron deficiency anemia    "when I was young"  . Lung nodules   . Migraine    "2-3/wk" (12/03/2016)  . MS (multiple sclerosis) ([place])    questionable per pt  . Noncompliance   . Osteoporosis   . Peripheral neuropathy    weakness,numbness,and tingling. Takes Gabapentin daily  . PTSD (post-traumatic stress disorder) dx'd 2016/2017   "was dx'd w/bipolar in 1991; replaced w/PTSD dx 2016/2017" (  12/03/2016)  . Restless leg syndrome    takes Requip daily  . 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 Type II diabetes mellitus ([place])    "went off insulin in 2012/2013" (12/03/2016)    Patient Active Problem List   Diagnosis Date Noted  . Left lower quadrant abdominal pain affecting pregnancy 12/21/2017  . Hypertensive emergency 12/20/2017  . Chest pain in adult 08/20/2017  . Abnormal brain MRI 08/08/2017  . Weakness of left leg 05/19/2017  . Hypertension 12/03/2016  . Adverse food reaction 10/14/2016  . Pulmonary nodules 10/14/2016  . Non-seasonal allergic rhinitis due to fungal spores 10/14/2016  . Cigarette smoker 09/24/2016  . Cerebral microvascular disease  08/19/2016  . Non compliance with medical treatment 05/11/2016  . Leg pain, anterior, right 05/11/2016  . Syncope 05/02/2016  . Type 2 diabetes mellitus with diabetic neuropathy ([place]) 05/02/2016  . Restless leg syndrome 05/02/2016  . History of CVA with residual deficit   . Vascular headache   . History of chest pain   . Generalized anxiety disorder   . Chronic bilateral low back pain without sciatica   . Peripheral neuropathy   . Migraine without status migrainosus, not intractable   . PTSD (post-traumatic stress disorder)   . History of cerebrovascular accident (CVA) with residual deficit   . CVA (cerebral vascular accident) ([place]) 04/03/2016  . Stroke (cerebrum) ([place]) 04/03/2016  . Post traumatic stress disorder 10/06/2015  . Precordial pain   . Migraine syndrome 01/24/2015  . Migraine 01/24/2015  . TIA (transient ischemic attack) 12/25/2014  . Hypertensive urgency   . Atypical chest pain   . Malignant hypertension   . Hypokalemia 04/16/2014  . Muscle weakness (generalized) 04/05/2014  . Stiffness of left hip joint 04/05/2014  . Pain in left hip 04/05/2014  . Diastolic dysfunction 01/74/9449  . Normal coronary arteries 03/20/2014  . PUD (peptic ulcer disease) 03/20/2014  . Type 2 diabetes, uncontrolled, with neuropathy ([place]) 03/20/2014  . Morbid obesity due to excess calories ([place]) 03/20/2014  . Left-sided weakness 03/20/2014  . Paresthesias 03/20/2014  . Cerebrovascular disease 03/20/2014  . Dyspnea on exertion 03/05/2012  . Tobacco abuse 03/05/2012  . Moderate COPD (chronic obstructive pulmonary disease) ([place]) 11/23/2011  . GERD (gastroesophageal reflux disease) 11/23/2011  . Uncontrolled hypertension 11/23/2011    Past Surgical History:  Procedure Laterality Date  . ABDOMINAL AORTOGRAM N/A 12/03/2016   Procedure: Abdominal Aortogram;  Surgeon: Angelia Mould, MD;  Location: [place] [facility];  Service: Cardiovascular;  Laterality: N/A;  . ABDOMINAL  HYSTERECTOMY  12/1987  . ADENOIDECTOMY  1975  . ANKLE SURGERY Bilateral 1993; 1995 X2   "stabilzation done; 1 on the right; 2 on the left"  . APPENDECTOMY  1989  . BREAST EXCISIONAL BIOPSY Left   . BREAST LUMPECTOMY Left    "benign tumor"  . CARDIAC CATHETERIZATION N/A 01/31/2015   Procedure: Left Heart Cath and Coronary Angiography;  Surgeon: Burnell Blanks, MD;  Location: [place] [facility];  Service: Cardiovascular;  Laterality: N/A;  . COLONOSCOPY    . DILATION AND CURETTAGE OF UTERUS    . ESOPHAGOGASTRODUODENOSCOPY    . LAPAROSCOPIC CHOLECYSTECTOMY  1996  . NASAL RECONSTRUCTION  1976  . NASAL SINUS SURGERY  1975  . POSTERIOR LUMBAR FUSION  2005  . RADIOLOGY WITH ANESTHESIA N/A 01/15/2016   Procedure: MRI LUMBAR SPINE WITHOUT;  Surgeon: Medication Radiologist, MD;  Location:  [place] [facility];  Service: Radiology;  Laterality: N/A;  . RADIOLOGY WITH ANESTHESIA N/A 06/29/2016   Procedure: MRI OF THE BRAIN WITH AND WITHOUT;  Surgeon: Medication Radiologist, MD;  Location: [place];  Service: Radiology;  Laterality: N/A;  . RENAL ANGIOGRAPHY N/A 12/03/2016   Procedure: Renal Angiography;  Surgeon: Angelia Mould, MD;  Location: [place] [facility];  Service: Cardiovascular;  Laterality: N/A;  . TONSILLECTOMY  1992  . TUBAL LIGATION    . TUMOR EXCISION  1998   from back of skull; developed;  MRSA from the area that had to be packed     OB History   None      Home Medications    Prior to Admission medications   Medication Sig Start Date End Date Taking? Authorizing Provider  amLODipine (NORVASC) 10 MG tablet Take 1 tablet (10 mg total) by mouth daily. 06/05/14   Herminio Commons, MD  aspirin-acetaminophen-caffeine (EXCEDRIN EXTRA STRENGTH) [phone] MG tablet Take 2 tablets by mouth daily as needed for headache or migraine.     [provider]  atenolol (TENORMIN) 25 MG tablet Take 1 tablet (25 mg total) by mouth daily. 12/22/17   Geradine Girt, DO  chlorthalidone  (HYGROTON) 25 MG tablet Take 1 tablet (25 mg total) by mouth daily. 12/22/17   Geradine Girt, DO  clopidogrel (PLAVIX) 75 MG tablet Take 75 mg by mouth daily.     [provider]  dexlansoprazole (DEXILANT) 60 MG capsule Take 1 tab by mouth every morning. Patient taking differently: Take 60 mg by mouth every evening.  10/18/16   Esterwood, Amy S, PA-C  EPINEPHrine (EPIPEN 2-PAK) 0.3 mg/0.3 mL IJ SOAJ injection Inject into the muscle.    [provider]  gabapentin (NEURONTIN) 600 MG tablet Take 600 mg by mouth 4 (four) times daily.    [provider]  hydrALAZINE (APRESOLINE) 100 MG tablet Take 1 tablet (100 mg total) by mouth 3 (three) times daily. 01/30/18   Herminio Commons, MD  isosorbide mononitrate (IMDUR) 30 MG 24 hr tablet Take 1 tablet (30 mg total) by mouth daily. 01/30/18 04/30/18  Herminio Commons, MD  losartan (COZAAR) 50 MG tablet Take 1 tablet (50 mg total) by mouth 2 (two) times daily. 12/22/17   Geradine Girt, DO  rOPINIRole (REQUIP) 4 MG tablet Take 4 mg by mouth at bedtime.    [provider]  zolpidem (AMBIEN CR) 12.5 MG CR tablet Take 12.5 mg by mouth at bedtime. 05/16/17   [provider]    Family History Family History  Problem Relation Age of Onset  . Coronary artery disease Father   . Emphysema Father   . Heart attack Father 15       Died age 67  . Stroke Father   . Cancer Father        Unsure of type   . Allergic rhinitis Father   . Depression Mother   . Skin cancer Mother   . Bipolar disorder Brother   . Drug abuse Brother   . Leukemia Brother   . Bipolar disorder Daughter   . Allergic rhinitis Daughter   . Asthma Daughter   . Urticaria Daughter   . Diabetes Maternal Grandmother   . Stomach cancer Maternal Grandfather   . Heart attack Sister        Died in 81s  . Heart attack Brother        Died age 21  . Stomach cancer  Other   . Liver disease Cousin   . Angioedema Neg Hx   . Atopy Neg Hx   .  Eczema Neg Hx   . Immunodeficiency Neg Hx   . Breast cancer Neg Hx   . Colon cancer Neg Hx     Social History Social History   Tobacco Use  . Smoking status: Current Every Day Smoker    Packs/day: 1.50    Years: 45.00    Pack years: 67.50    Types: Cigarettes    Start date: 03/04/1971  . Smokeless tobacco: Never Used  Substance Use Topics  . Alcohol use: No    Alcohol/week: 0.0 standard drinks  . Drug use: No     Allergies   Other; Penicillins; Zithromax [azithromycin]; Aspirin; Pineapple; Strawberry extract; Aspartame and phenylalanine; Mushroom extract complex; and Nicardipine   Review of Systems Review of Systems  Constitutional: Negative for activity change, appetite change, fatigue and fever.  HENT: Negative for congestion and rhinorrhea.   Eyes: Negative for visual disturbance.  Respiratory: Positive for chest tightness and shortness of breath.   Cardiovascular: Positive for chest pain.  Gastrointestinal: Negative for abdominal pain, nausea and vomiting.  Genitourinary: Negative for dysuria, hematuria, vaginal bleeding and vaginal discharge.  Musculoskeletal: Negative for back pain.  Neurological: Positive for dizziness, weakness, light-headedness and headaches.    all other systems are negative except as noted in the HPI and PMH.   Physical Exam Updated Vital Signs BP (!) 225/112 (BP Location: Left Arm)   Pulse (!) 101   Temp 98.1 F (36.7 C) (Oral)   Ht 5' 6.5" (1.689 m)   Wt 97.5 kg   SpO2 97%   BMI 34.18 kg/m   Physical Exam  Constitutional: She is oriented to person, place, and time. She appears well-developed and well-nourished. No distress.  uncomfortable  HENT:  Head: Normocephalic and atraumatic.  Mouth/Throat: Oropharynx is clear and moist. No oropharyngeal exudate.  Eyes: Pupils are equal, round, and reactive to light. Conjunctivae and EOM are normal.  Neck: Normal range of motion. Neck supple.  No meningismus.  Cardiovascular: Normal  rate, regular rhythm, normal heart sounds and intact distal pulses.  No murmur heard. Pulmonary/Chest: Effort normal and breath sounds normal. No respiratory distress. She exhibits no tenderness.  Abdominal: Soft. There is no tenderness. There is no rebound and no guarding.  Musculoskeletal: Normal range of motion. She exhibits no edema or tenderness.  Neurological: She is alert and oriented to person, place, and time. No cranial nerve deficit. She exhibits normal muscle tone. Coordination normal.  Cranial nerves II to XII intact, tongue is midline, face is symmetric.  There is visual field cut on the left which is chronic.  Weakness of left upper and left lower extremity chronic per patient.  5/5 strength on the right.  No pronator drift. Positive Romberg, gait not tested  Skin: Skin is warm.  Psychiatric: She has a normal mood and affect. Her behavior is normal.  Nursing note and vitals reviewed.    ED Treatments / Results  Labs (all labs ordered are listed, but only abnormal results are displayed) Labs Reviewed  CBC WITH DIFFERENTIAL/PLATELET - Abnormal; Notable for the following components:      Result Value   WBC 11.2 (*)    Lymphs Abs 5.3 (*)    All other components within normal limits  COMPREHENSIVE METABOLIC PANEL - Abnormal; Notable for the following components:   Glucose, Bld 119 (*)    All other components within  normal limits  TROPONIN I  BRAIN NATRIURETIC PEPTIDE  MAGNESIUM  PHOSPHORUS  HEMOGLOBIN A1C    EKG EKG Interpretation  Date/Time:  Sunday February 05 2018 23:53:33 EDT Ventricular Rate:  93 PR Interval:    QRS Duration: 98 QT Interval:  400 QTC Calculation: 498 R Axis:   40 Text Interpretation:  Sinus rhythm Consider left atrial enlargement Borderline prolonged QT interval Baseline wander in lead(s) II aVF V3 V5 wandering baseline No significant change was found Confirmed by Ezequiel Essex [phone]) on 02/06/2018 12:23:19 AM   Radiology Dg Chest 2  View  Result Date: 02/06/2018 CLINICAL DATA:  Headache and dizziness x1 day. New dyspnea. Positive smoking history. EXAM: CHEST - 2 VIEW COMPARISON:  None. FINDINGS: Hyperinflated lungs with likely smoking related bronchitic change. Heart and mediastinal contours are normal in size. No aneurysm. No acute osseous abnormality. Chronic tapered appearance of the distal right clavicle at the [facility] joint. IMPRESSION: Bronchitic change of the lungs.  Mild pulmonary hyperinflation. Electronically Signed   By: Ashley Royalty M.D.   On: 02/06/2018 01:02   Ct Head Wo Contrast  Result Date: 02/06/2018 CLINICAL DATA:  Headache and dizziness x1 day. EXAM: CT HEAD WITHOUT CONTRAST TECHNIQUE: Contiguous axial images were obtained from the base of the skull through the vertex without intravenous contrast. COMPARISON:  12/20/2017 FINDINGS: Brain: Slightly heterogeneous appearance of the occipital lobes at the gray-white matter juncture is similar to prior that can be seen with hypertensive posterior reversible encephalopathy syndrome. Findings are similar to that prior exam. No acute intracranial hemorrhage, midline shift or edema. No hydrocephalus. Chronic minimal small vessel ischemic disease is noted. Midline fourth ventricle and basal cisterns. No intra-axial mass nor extra-axial fluid collections. Midline fourth ventricle and basal cisterns without effacement. Cerebellum and brainstem are nonacute. Vascular: No hyperdense vessel sign. Skull: Acute skull fracture or suspicious osseous lesions. Sinuses/Orbits: Intact Other: None IMPRESSION: Slightly heterogeneous appearance of the occipital lobes may reflect posterior reversible encephalopathy/hypertensive encephalopathy similar prior. Minimal small vessel ischemic disease. No acute intracranial abnormality. Electronically Signed   By: Ashley Royalty M.D.   On: 02/06/2018 01:07    Procedures Procedures (including critical care time)  Medications Ordered in ED Medications    ondansetron (ZOFRAN) injection 4 mg (has no administration in time range)  hydrALAZINE (APRESOLINE) injection 10 mg (has no administration in time range)     Initial Impression / Assessment and Plan / ED Course  I have reviewed the triage vital signs and the nursing notes.  Pertinent labs & imaging results that were available during my care of the patient were reviewed by me and considered in my medical decision making (see chart for details).    Patient with uncontrolled hypertension and previous stroke presenting with intermittent dizziness, headache and chest pain since yesterday.  EKG here shows no acute ST changes. She reports her left-sided weakness is unchanged.  Last seen normal was August 31.  CT head shows chronic changes of PRES without hemorrhage Labs are reassuring with stable creatinine and negative troponin.  Patient given IV hydralazine as well as IV labetalol for her hypertensive crisis.  Her chest pain appears atypical for ACS and is brief in nature lasting just a few minutes at a time.  First troponin is negative.  Low suspicion for pulmonary embolism or aortic dissection.  Plan admission for hypertensive emergency.  Will also need MRI given her persistent dizziness and lightheadedness to rule out new stroke.  She is not a candidate for TPA or  other intervention due to delay in presentation. LSN >24 hours ago.  Admission discussed with Dr. Olevia Bowens. Blood pressure has improved to 177/73.  We will hold nitroglycerin drip at this time.  CRITICAL CARE Performed by: Ezequiel Essex Total critical care time: 40 minutes Critical care time was exclusive of separately billable procedures and treating other patients. Critical care was necessary to treat or prevent imminent or life-threatening deterioration. Critical care was time spent personally by me on the following activities: development of treatment plan with patient and/or surrogate as well as nursing, discussions with  consultants, evaluation of patient's response to treatment, examination of patient, obtaining history from patient or surrogate, ordering and performing treatments and interventions, ordering and review of laboratory studies, ordering and review of radiographic studies, pulse oximetry and re-evaluation of patient's condition.  Final Clinical Impressions(s) / ED Diagnoses   Final diagnoses:  Hypertensive emergency  Dizziness    ED Discharge Orders    None       Terrell Ostrand, Annie Main, MD 02/06/18 [phone]

## 2018-02-06 NOTE — Progress Notes (Signed)
Upon entering patient room during shift report, patient is irate and states that if she does not see her doctor within the next several hours she will be leaving AMA due to her diet restrictions. Hospitalist Memon was paged and notified of patients statements. Will continue to monitor,

## 2018-02-06 NOTE — Progress Notes (Signed)
Patient admitted to the hospital earlier this morning by Dr. Olevia Bowens.  Patient seen and examined.  Continues to complain of some dizziness.  Lungs are clear bilaterally.  Heart sounds normal.  She is admitted to the hospital with severe hypertension, headache and dizziness.  Although the medical record indicates a history of noncompliance, patient is adamant that she is very compliant with all her medications at home.  She has been started on her home medications for high blood pressure.  We will continue to monitor to ensure that her blood pressure does not rapidly decline.  If blood pressure remains elevated, her antihypertensive regimen may need further adjustment.  Goal blood pressure would be between 150-170, since she reports that her blood pressure is chronically in the 200 range.  MRI of the brain has been ordered.  She says that she will likely need to receive Ativan prior to any MRI scans.  Anticipate discharge home in the next 24 hours.  [employer]

## 2018-02-07 ENCOUNTER — Ambulatory Visit (HOSPITAL_COMMUNITY): Payer: Self-pay | Admitting: Psychiatry

## 2018-02-07 ENCOUNTER — Observation Stay (HOSPITAL_COMMUNITY): Payer: Medicare Other

## 2018-02-07 DIAGNOSIS — I161 Hypertensive emergency: Secondary | ICD-10-CM | POA: Diagnosis not present

## 2018-02-07 DIAGNOSIS — I160 Hypertensive urgency: Secondary | ICD-10-CM | POA: Diagnosis not present

## 2018-02-07 LAB — CBC
HCT: 40 % (ref 36.0–46.0)
Hemoglobin: 12.8 g/dL (ref 12.0–15.0)
MCH: 29.1 pg (ref 26.0–34.0)
MCHC: 32 g/dL (ref 30.0–36.0)
MCV: 90.9 fL (ref 78.0–100.0)
Platelets: 225 10*3/uL (ref 150–400)
RBC: 4.4 MIL/uL (ref 3.87–5.11)
RDW: 14.9 % (ref 11.5–15.5)
WBC: 7.7 10*3/uL (ref 4.0–10.5)

## 2018-02-07 LAB — BASIC METABOLIC PANEL
Anion gap: 6 (ref 5–15)
BUN: 15 mg/dL (ref 6–20)
CHLORIDE: 110 mmol/L (ref 98–111)
CO2: 26 mmol/L (ref 22–32)
Calcium: 8.7 mg/dL — ABNORMAL LOW (ref 8.9–10.3)
Creatinine, Ser: 0.85 mg/dL (ref 0.44–1.00)
GFR calc non Af Amer: >60 mL/min (ref >60)
Glucose, Bld: 91 mg/dL (ref 70–99)
Potassium: 3.8 mmol/L (ref 3.5–5.1)
SODIUM: 142 mmol/L (ref 135–145)

## 2018-02-07 LAB — GLUCOSE, CAPILLARY
GLUCOSE-CAPILLARY: 108 mg/dL — AB (ref 70–99)
Glucose-Capillary: 95 mg/dL (ref 70–99)
Glucose-Capillary: 118 mg/dL — ABNORMAL HIGH (ref 70–99)

## 2018-02-07 MED ORDER — LORAZEPAM 2 MG/ML IJ SOLN
0.5000 mg | Freq: Once | INTRAMUSCULAR | Status: AC
  Administered 2018-02-07: 0.5 mg via INTRAVENOUS
  Filled 2018-02-07: qty 1

## 2018-02-07 MED ORDER — LABETALOL HCL 5 MG/ML IV SOLN: 5.0000 mg | INTRAVENOUS | Status: DC | PRN

## 2018-02-07 MED ORDER — CLONIDINE HCL 0.1 MG PO TABS
0.1000 mg | ORAL_TABLET | Freq: Three times a day (TID) | ORAL | Status: DC
  Filled 2018-02-07 (×2): qty 1

## 2018-02-07 MED ORDER — LORAZEPAM 2 MG/ML IJ SOLN
0.5000 mg | Freq: Once | INTRAMUSCULAR | Status: AC | PRN
  Administered 2018-02-07: 0.5 mg via INTRAVENOUS
  Filled 2018-02-07: qty 1

## 2018-02-07 MED ORDER — LABETALOL HCL 5 MG/ML IV SOLN: 10.0000 mg | INTRAVENOUS | Status: DC | PRN

## 2018-02-07 MED ORDER — BUSPIRONE HCL 5 MG PO TABS: 5.0000 mg | ORAL_TABLET | Freq: Two times a day (BID) | ORAL | Status: DC | PRN

## 2018-02-07 MED ORDER — LORAZEPAM 2 MG/ML IJ SOLN
1.0000 mg | Freq: Four times a day (QID) | INTRAMUSCULAR | Status: DC | PRN
  Administered 2018-02-07: 1 mg via INTRAVENOUS
  Filled 2018-02-07: qty 1

## 2018-02-07 MED ORDER — HALOPERIDOL LACTATE 5 MG/ML IJ SOLN: 1.0000 mg | Freq: Four times a day (QID) | INTRAMUSCULAR | Status: DC | PRN

## 2018-02-07 MED ORDER — CLONIDINE HCL 0.1 MG PO TABS: 0.1000 mg | ORAL_TABLET | Freq: Two times a day (BID) | ORAL | Status: DC
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
## 2018-02-07 NOTE — BH Assessment (Addendum)
Tele Assessment Note   Patient Name: Katrina [name] MRN: [id number] Referring Physician: Melven Sartorius., MD Location of Patient:  [facility] Location of Provider: [place] is a 57 y.o. female in [facility] due to medical concerns. She received an MRI and, afterwards, indicated that she was SI to the referring physician. Pt has a hx of depression and PTSD and regularly sees a psychiatrist and therapist.Pt is very irritable during assessment and not very forthcoming with information.  Pt reports that she has not taken any psychotropic meds since the beginning of July due to being taken off of them in the hospital. Pt initially says that she feels like she wants to die and will not say that she wants to kill herself. Later in the assessment, pt says that she does want to kill herself and, if she goes home, she will have several plans to do so. Pt denies HI or AVH. Pt's main stressor appears to be her various medical issues.   Case staffed with Priscille Loveless, NP, and pt will be given medication recommendations and it is recommended that she be observed overnight afterward and re-assessed by psychiatry in the morning. Farris Has will contact MD with med recs.    Diagnosis: F33.2 MDD, recurrent, severe w/out psychotic features  Past Medical History:  Past Medical History:  Diagnosis Date  . Allergy   . Anemia 1975-1976   . Anxiety    takes Valium daily as needed  . Arthritis    "spine" (12/03/2016)  . Asthma    has inhalers but doesn't use (12/03/2016)  . Chronic bronchitis ([place])    "get it a couple times q yr" (12/03/2016)  . Chronic kidney disease   . Chronic lower back pain    budlging disc   . Claustrophobia   . COPD (chronic obstructive pulmonary disease) ([place])   . Daily headache   . Depression   . Diverticulitis   . Family history of adverse reaction to anesthesia    2 daughters gets extremely sick   . GERD (gastroesophageal reflux disease)     takes Dexilant daily  . Heart murmur   . History of blood transfusion 1975-1976 "several"   no abnormal reaction noted  . History of colitis   . History of colon polyps    benign  . History of gastric ulcer   . History of hiatal hernia    "small one"  . History of MRSA infection 2017  . Hyperlipidemia    was on meds but has been off over a yr  . Hypertension    takes Amlodipine and Maxzide daily  . Insomnia    takes Ambien nightly  . Iron deficiency anemia    "when I was young"  . Lung nodules   . Migraine    "2-3/wk" (12/03/2016)  . MS (multiple sclerosis) ([place])    questionable per pt  . Noncompliance   . Osteoporosis   . Peripheral neuropathy    weakness,numbness,and tingling. Takes Gabapentin daily  . PTSD (post-traumatic stress disorder) dx'd 2016/2017   "was dx'd w/bipolar in 1991; replaced w/PTSD dx 2016/2017" (12/03/2016)  . Restless leg syndrome    takes Requip daily  . 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 Type II diabetes mellitus ([place])    "went off insulin in 2012/2013" (12/03/2016)    Past Surgical History:  Procedure Laterality  Date  . ABDOMINAL AORTOGRAM N/A 12/03/2016   Procedure: Abdominal Aortogram;  Surgeon: Angelia Mould, MD;  Location: [place] [facility];  Service: Cardiovascular;  Laterality: N/A;  . ABDOMINAL HYSTERECTOMY  12/1987  . ADENOIDECTOMY  1975  . ANKLE SURGERY Bilateral 1993; 1995 X2   "stabilzation done; 1 on the right; 2 on the left"  . APPENDECTOMY  1989  . BREAST EXCISIONAL BIOPSY Left   . BREAST LUMPECTOMY Left    "benign tumor"  . CARDIAC CATHETERIZATION N/A 01/31/2015   Procedure: Left Heart Cath and Coronary Angiography;  Surgeon: Burnell Blanks, MD;  Location: [place] [facility];  Service: Cardiovascular;  Laterality: N/A;  . COLONOSCOPY    . DILATION AND CURETTAGE OF UTERUS    . ESOPHAGOGASTRODUODENOSCOPY    . LAPAROSCOPIC  CHOLECYSTECTOMY  1996  . NASAL RECONSTRUCTION  1976  . NASAL SINUS SURGERY  1975  . POSTERIOR LUMBAR FUSION  2005  . RADIOLOGY WITH ANESTHESIA N/A 01/15/2016   Procedure: MRI LUMBAR SPINE WITHOUT;  Surgeon: Medication Radiologist, MD;  Location: [place];  Service: Radiology;  Laterality: N/A;  . RADIOLOGY WITH ANESTHESIA N/A 06/29/2016   Procedure: MRI OF THE BRAIN WITH AND WITHOUT;  Surgeon: Medication Radiologist, MD;  Location: [place];  Service: Radiology;  Laterality: N/A;  . RENAL ANGIOGRAPHY N/A 12/03/2016   Procedure: Renal Angiography;  Surgeon: Angelia Mould, MD;  Location: [place] [facility];  Service: Cardiovascular;  Laterality: N/A;  . TONSILLECTOMY  1992  . TUBAL LIGATION    . TUMOR EXCISION  1998   from back of skull; developed;  MRSA from the area that had to be packed    Family History:  Family History  Problem Relation Age of Onset  . Coronary artery disease Father   . Emphysema Father   . Heart attack Father 73       Died age 79  . Stroke Father   . Cancer Father        Unsure of type   . Allergic rhinitis Father   . Depression Mother   . Skin cancer Mother   . Bipolar disorder Brother   . Drug abuse Brother   . Leukemia Brother   . Bipolar disorder Daughter   . Allergic rhinitis Daughter   . Asthma Daughter   . Urticaria Daughter   . Diabetes Maternal Grandmother   . Stomach cancer Maternal Grandfather   . Heart attack Sister        Died in 53s  . Heart attack Brother        Died age 35  . Stomach cancer Other   . Liver disease Cousin   . Angioedema Neg Hx   . Atopy Neg Hx   . Eczema Neg Hx   . Immunodeficiency Neg Hx   . Breast cancer Neg Hx   . Colon cancer Neg Hx     Social History:  reports that she has been smoking cigarettes. She started smoking about 46 years ago. She has a 67.50 pack-year smoking history. She has never used smokeless tobacco. She reports that she does not drink alcohol or use drugs.  Additional Social History:   Alcohol / Drug Use Pain Medications: see MAR Prescriptions: see MAR Over the Counter: see MAR History of alcohol / drug use?: No history of alcohol / drug abuse  CIWA: CIWA-Ar BP: (!) 195/110 Pulse Rate: (!) 102 COWS:    Allergies:  Allergies  Allergen Reactions  . Other Anaphylaxis and  Swelling    [place]  . Penicillins Anaphylaxis    Has patient had a PCN reaction causing immediate rash, facial/tongue/throat swelling, SOB or lightheadedness with hypotension: Yes Has patient had a PCN reaction causing severe rash involving mucus membranes or skin necrosis: Yes Has patient had a PCN reaction that required hospitalization Yes Has patient had a PCN reaction occurring within the last 10 years: No If all of the above answers are "NO", then may proceed with Cephalosporin use.   . Zithromax [Azithromycin] Anaphylaxis  . Aspirin Other (See Comments)    Due to stomach ulcers.   . Pineapple Rash  . Strawberry Extract Rash and Hives  . Aspartame And Phenylalanine Palpitations  . Mushroom Extract Complex Rash  . Nicardipine Nausea And Vomiting and Other (See Comments)    shaking    Home Medications:  Medications Prior to Admission  Medication Sig Dispense Refill  . amLODipine (NORVASC) 10 MG tablet Take 1 tablet (10 mg total) by mouth daily. 30 tablet 6  . aspirin-acetaminophen-caffeine (EXCEDRIN EXTRA STRENGTH) 250-250-65 MG tablet Take 2 tablets by mouth daily as needed for headache or migraine.     . chlorthalidone (HYGROTON) 25 MG tablet Take 1 tablet (25 mg total) by mouth daily. 30 tablet 0  . clopidogrel (PLAVIX) 75 MG tablet Take 75 mg by mouth daily.     Marland Kitchen dexlansoprazole (DEXILANT) 60 MG capsule Take 1 tab by mouth every morning. (Patient taking differently: Take 60 mg by mouth every evening. ) 30 capsule 1  . EPINEPHrine (EPIPEN 2-PAK) 0.3 mg/0.3 mL IJ SOAJ injection Inject into the muscle.    . gabapentin (NEURONTIN) 600 MG tablet Take 600 mg by mouth 4 (four) times daily.     . hydrALAZINE (APRESOLINE) 100 MG tablet Take 1 tablet (100 mg total) by mouth 3 (three) times daily. 90 tablet 6  . isosorbide mononitrate (IMDUR) 30 MG 24 hr tablet Take 1 tablet (30 mg total) by mouth daily. 30 tablet 6  . rOPINIRole (REQUIP) 4 MG tablet Take 4 mg by mouth at bedtime.    Marland Kitchen zolpidem (AMBIEN CR) 12.5 MG CR tablet Take 12.5 mg by mouth at bedtime.    Marland Kitchen atenolol (TENORMIN) 25 MG tablet Take 1 tablet (25 mg total) by mouth daily. (Patient not taking: Reported on 02/06/2018) 30 tablet 0  . losartan (COZAAR) 50 MG tablet Take 1 tablet (50 mg total) by mouth 2 (two) times daily. (Patient not taking: Reported on 02/06/2018) 60 tablet 0    OB/GYN Status:  No LMP recorded. Patient has had a hysterectomy.  General Assessment Data Location of Assessment: [facility] TTS Assessment: In system Is this a Tele or Face-to-Face Assessment?: Tele Assessment Is this an Initial Assessment or a Re-assessment for this encounter?: Initial Assessment Patient Accompanied by:: N/A Language Other than English: No Living Arrangements: Other (Comment) What gender do you identify as?: Female Marital status: Divorced Dolores name: Apuzzo Pregnancy Status: No Living Arrangements: Children Can pt return to current living arrangement?: Yes Admission Status: Voluntary Is patient capable of signing voluntary admission?: Yes Referral Source: Self/Family/Friend     Crisis Care Plan Living Arrangements: Children Name of Psychiatrist: Norman Clay Name of Therapist: Vonna Kotyk Sheets  Education Status Is patient currently in school?: No Is the patient employed, unemployed or receiving disability?: Unemployed, Receiving disability income  Risk to self with the past 6 months Suicidal Ideation: Yes-Currently Present Has patient been a risk to self within the past 6 months prior to  admission? : No Suicidal Intent: Yes-Currently Present Has patient had any suicidal intent within the past 6  months prior to admission? : No Is patient at risk for suicide?: Yes Suicidal Plan?: No Has patient had any suicidal plan within the past 6 months prior to admission? : No Access to Means: Yes Specify Access to Suicidal Means: access at home What has been your use of drugs/alcohol within the last 12 months?: hx of alcohol use Previous Attempts/Gestures: Yes How many times?: 3 Triggers for Past Attempts: Unknown, Unpredictable Intentional Self Injurious Behavior: None Family Suicide History: Yes(brother) Recent stressful life event(s): Recent negative physical changes, Other (Comment) Persecutory voices/beliefs?: Yes Depression: Yes Depression Symptoms: Feeling angry/irritable, Isolating Substance abuse history and/or treatment for substance abuse?: No Suicide prevention information given to non-admitted patients: Not applicable  Risk to Others within the past 6 months Homicidal Ideation: No Does patient have any lifetime risk of violence toward others beyond the six months prior to admission? : Unknown Thoughts of Harm to Others: No Current Homicidal Intent: No Current Homicidal Plan: No Access to Homicidal Means: No History of harm to others?: No Assessment of Violence: None Noted Does patient have access to weapons?: No Criminal Charges Pending?: No Does patient have a court date: No Is patient on probation?: No  Psychosis Hallucinations: None noted Delusions: None noted  Mental Status Report Appearance/Hygiene: Unremarkable Eye Contact: Poor Motor Activity: Unremarkable Speech: Logical/coherent Level of Consciousness: Irritable Mood: Irritable Affect: Appropriate to circumstance Anxiety Level: Minimal Thought Processes: Coherent, Relevant Judgement: Partial Orientation: Person, Place, Time, Situation Obsessive Compulsive Thoughts/Behaviors: None  Cognitive Functioning Concentration: Fair Memory: Recent Intact Is patient IDD: No Insight: Poor Impulse Control:  Unable to Assess Appetite: Fair Have you had any weight changes? : No Change Sleep: No Change Vegetative Symptoms: None  ADLScreening [facility] Assessment Services) Patient's cognitive ability adequate to safely complete daily activities?: Yes Patient able to express need for assistance with ADLs?: Yes Independently performs ADLs?: Yes (appropriate for developmental age)  Prior Inpatient Therapy Prior Inpatient Therapy: Yes Prior Therapy Dates: total of 3 times with last time in 2000 Prior Therapy Facilty/Provider(s): pt doesn't know names Reason for Treatment: depression  Prior Outpatient Therapy Prior Outpatient Therapy: No Does patient have an ACCT team?: No Does patient have Intensive In-House Services?  : No Does patient have [facility] services? : No Does patient have P4CC services?: No  ADL Screening (condition at time of admission) Patient's cognitive ability adequate to safely complete daily activities?: Yes Is the patient deaf or have difficulty hearing?: No Does the patient have difficulty seeing, even when wearing glasses/contacts?: No Does the patient have difficulty concentrating, remembering, or making decisions?: No Patient able to express need for assistance with ADLs?: Yes Does the patient have difficulty dressing or bathing?: No Independently performs ADLs?: Yes (appropriate for developmental age) Does the patient have difficulty walking or climbing stairs?: No Weakness of Legs: None Weakness of Arms/Hands: None  Home Assistive Devices/Equipment Home Assistive Devices/Equipment: Blood pressure cuff, Cane (specify quad or straight), Walker (specify type), CBG Meter, Dentures (specify type), Eyeglasses(Quad cane, 4-wheel walker, Upper and lower dentures)  Therapy Consults (therapy consults require a physician order) PT Evaluation Needed: No OT Evalulation Needed: No SLP Evaluation Needed: No Abuse/Neglect Assessment (Assessment to be complete while patient is  alone) Abuse/Neglect Assessment Can Be Completed: Unable to assess, patient is non-responsive or altered mental status(Patient states "I'm not answering that".) Values / Beliefs Cultural Requests During Hospitalization: None Spiritual Requests During Hospitalization: None  Consults Spiritual Care Consult Needed: No Social Work Consult Needed: No Regulatory affairs officer (For Healthcare) Does Patient Have a Medical Advance Directive?: Yes Does patient want to make changes to medical advance directive?: No - Patient declined Type of Advance Directive: Healthcare Power of Attorney, Living will [place] in Chart?: Yes Copy of Living Will in Chart?: Yes Nutrition Screen- [facility] Adult/[facility] Patient's home diet: Regular Has the patient recently lost weight without trying?: No Has the patient been eating poorly because of a decreased appetite?: No Malnutrition Screening Tool Score: 0        Disposition:  Disposition Initial Assessment Completed for this Encounter: Yes  This service was provided via telemedicine using a 2-way, interactive audio and video technology.  Names of all persons participating in this telemedicine service and their role in this encounter.   Rexene Edison 02/07/2018 12:40 PM

## 2018-02-07 NOTE — Progress Notes (Addendum)
Patient refusing all treatment.  Refusing medications and vital signs.  Patient stated that she doesn't want the nurse in her room currently.  Police and sitter present in room at this time.  On call MD notified via text page that patient is refusing all treatment.  Will continue to monitor.

## 2018-02-07 NOTE — Progress Notes (Signed)
Pt with 1:1 suicide sitter in room. Daughter visiting to help keep pt calm. Pt c/o severe headache. BP 196/117. Pt refused tylenol for headache, refused prn bp meds, refused anti anxiety meds. Pt's daughter left at this time to go home and pick up clothes for pt. As soon as daughter left pt became very agitated and refused to have safety sitter in room. Explained that sitter must stay in room d/t hospital protocol. Pt became more agitated and and try to shut herself in the bathroom stated she didn't want anyone staring at her. Tried to deescalate situation but pt states she wants to go home. Explained that doctor could not let her leave AMA d/t her suicidal ideations. Notified Dr. Florene Glen of situation and he states that he will initiate IVC procedure.

## 2018-02-07 NOTE — Progress Notes (Signed)
PT Cancellation Note  Patient Details Name: Katrina [name] MRN: [id number] DOB: 1961-02-08   Cancelled Treatment:    Reason Eval/Treat Not Completed: Patient at procedure or test/unavailable.  Therapy held per RN request secondary to patient undergoing IVC placement.  Will check back tomorrow.   4:26 PM, 02/07/18 Lonell Grandchild, MPT Physical Therapist with [facility] [phone] office [phone] mobile phone

## 2018-02-07 NOTE — Care Management Obs Status (Signed)
[facility] NOTIFICATION   Patient Details  Name: DAJANAY NORTHRUP MRN: [id number] Date of Birth: [birth year]   Medicare Observation Status Notification Given:  Yes    Sherald Barge, RN 02/07/2018, 10:24 AM
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
## 2018-02-07 NOTE — Progress Notes (Addendum)
Patient agreed to have blood pressure taken.  Patient also agreed to take all medications except blood pressure pills (catapress, cozaar, and hydralazine).  Explained the importance of these medications and patient still refuses.  Will continue to monitor.
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
## 2018-02-07 NOTE — Progress Notes (Signed)
Pt took scheduled hydralazine and gabapentin and prn ativan at this time. Pt refused to take clonidine or prn bp meds. Pt's last BP is 200/110. Explained risks of high blood pressure including stroke to pt and her daughter [name] who is in the room. Pt verbalized understanding and states she will not take any other meds. Dr. Florene Glen notified.

## 2018-02-07 NOTE — Progress Notes (Signed)
Deputies present to serve IVC papers.

## 2018-02-08 ENCOUNTER — Ambulatory Visit (HOSPITAL_COMMUNITY): Payer: Self-pay | Admitting: Licensed Clinical Social Worker

## 2018-02-08 ENCOUNTER — Encounter (HOSPITAL_COMMUNITY): Payer: Self-pay | Admitting: Registered Nurse

## 2018-02-08 DIAGNOSIS — G3184 Mild cognitive impairment, so stated: Secondary | ICD-10-CM

## 2018-02-08 DIAGNOSIS — Z9119 Patient's noncompliance with other medical treatment and regimen: Secondary | ICD-10-CM

## 2018-02-08 DIAGNOSIS — I6930 Unspecified sequelae of cerebral infarction: Secondary | ICD-10-CM

## 2018-02-08 DIAGNOSIS — F4329 Adjustment disorder with other symptoms: Secondary | ICD-10-CM

## 2018-02-08 DIAGNOSIS — R45851 Suicidal ideations: Secondary | ICD-10-CM | POA: Diagnosis not present

## 2018-02-08 DIAGNOSIS — J449 Chronic obstructive pulmonary disease, unspecified: Secondary | ICD-10-CM

## 2018-02-08 DIAGNOSIS — I5189 Other ill-defined heart diseases: Secondary | ICD-10-CM | POA: Diagnosis not present

## 2018-02-08 DIAGNOSIS — F411 Generalized anxiety disorder: Secondary | ICD-10-CM

## 2018-02-08 DIAGNOSIS — E1140 Type 2 diabetes mellitus with diabetic neuropathy, unspecified: Secondary | ICD-10-CM

## 2018-02-08 DIAGNOSIS — Z720 Tobacco use: Secondary | ICD-10-CM

## 2018-02-08 DIAGNOSIS — I160 Hypertensive urgency: Secondary | ICD-10-CM | POA: Diagnosis not present

## 2018-02-08 DIAGNOSIS — G2581 Restless legs syndrome: Secondary | ICD-10-CM

## 2018-02-08 DIAGNOSIS — R4189 Other symptoms and signs involving cognitive functions and awareness: Secondary | ICD-10-CM | POA: Diagnosis present

## 2018-02-08 DIAGNOSIS — F4310 Post-traumatic stress disorder, unspecified: Secondary | ICD-10-CM

## 2018-02-08 LAB — GLUCOSE, CAPILLARY
GLUCOSE-CAPILLARY: 83 mg/dL (ref 70–99)
GLUCOSE-CAPILLARY: 185 mg/dL — AB (ref 70–99)
Glucose-Capillary: 177 mg/dL — ABNORMAL HIGH (ref 70–99)

## 2018-02-08 LAB — HIV ANTIBODY (ROUTINE TESTING W REFLEX): HIV Screen 4th Generation wRfx: NONREACTIVE

## 2018-02-08 NOTE — Progress Notes (Signed)
Patient cleared by [facility] Spoke with patient and informed her that she was cleared by psychiatry, but not medically and should stay for medical treatment and that she was able to sign out AMA if she chose to do so. Suicide Sitter discontinued. Weyman Pedro RN in room during this conversation.
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
## 2018-02-08 NOTE — Progress Notes (Addendum)
Pt refused to be assessed or to take any of her morning meds. Dr. Crisoforo Oxford notified.

## 2018-02-08 NOTE — Clinical Social Work Note (Signed)
Late entry for 02/07/18. Per attending request ICV paperwork was faxed to magistrate.    Dijon Kohlman, Clydene Pugh, LCSW

## 2018-02-08 NOTE — Progress Notes (Signed)
Pt agreed to take the following meds at this time: amlodipine, plavix, gabapentin, hydralazine, nicotine patch and protonix. Pt continues to refuse to take chlorthalidone, clonidine, heparin, isosorbide, losartan, and metoprolol. Pt agreed to wear heart monitor. Dr. Crisoforo Oxford notified.

## 2018-02-08 NOTE — Consult Note (Signed)
Telepsych Consultation   Reason for Consult:  Suicidal ideation Referring Physician:Powell, A Clint Lipps., MD Location of Patient: [facility] 301/01 Location of Provider: [facility]  Patient Identification: Katrina [name] MRN:  [id number] Principal Diagnosis: Adjustment disorder with disturbance of emotion Diagnosis:   Patient Active Problem List   Diagnosis Date Noted  . Suicidal ideation [R45.851] 02/08/2018  . Impaired insight [G31.84] 02/08/2018  . Adjustment disorder with disturbance of emotion [F43.29] 02/08/2018  . Left lower quadrant abdominal pain affecting pregnancy [O26.899, R10.32] 12/21/2017  . Chest pain in adult [R07.9] 08/20/2017  . Abnormal brain MRI [R90.89] 08/08/2017  . Weakness of left leg [R29.898] 05/19/2017  . Hypertension [I10] 12/03/2016  . Adverse food reaction [T78.1XXA] 10/14/2016  . Pulmonary nodules [R91.8] 10/14/2016  . Non-seasonal allergic rhinitis due to fungal spores [J30.89] 10/14/2016  . Cigarette smoker [F17.210] 09/24/2016  . Cerebral microvascular disease [I67.9] 08/19/2016  . Non compliance with medical treatment [Z91.19] 05/11/2016  . Leg pain, anterior, right [M79.604] 05/11/2016  . Syncope [R55] 05/02/2016  . Type 2 diabetes mellitus with diabetic neuropathy ([place]) [E11.40] 05/02/2016  . Restless leg syndrome [G25.81] 05/02/2016  . History of CVA with residual deficit [I69.30]   . Vascular headache [G44.1]   . History of chest pain [Z87.898]   . Generalized anxiety disorder [F41.1]   . Chronic bilateral low back pain without sciatica [M54.5, G89.29]   . Peripheral neuropathy [G62.9]   . Migraine without status migrainosus, not intractable [G43.909]   . PTSD (post-traumatic stress disorder) [F43.10]   . History of cerebrovascular accident (CVA) with residual deficit [I69.30]   . CVA (cerebral vascular accident) ([place]) [I63.9] 04/03/2016  . Stroke (cerebrum) ([place]) [I63.9] 04/03/2016  . Post traumatic stress disorder [F43.10]  10/06/2015  . Precordial pain [R07.2]   . Migraine syndrome [G43.909] 01/24/2015  . Migraine [G43.909] 01/24/2015  . TIA (transient ischemic attack) [G45.9] 12/25/2014  . Hypertensive urgency [I16.0]   . Atypical chest pain [R07.89]   . Malignant hypertension [I10]   . Hypokalemia [E87.6] 04/16/2014  . Muscle weakness (generalized) [M62.81] 04/05/2014  . Stiffness of left hip joint [M25.652] 04/05/2014  . Pain in left hip [M25.552] 04/05/2014  . Diastolic dysfunction [Y30.16] 03/20/2014  . Normal coronary arteries [Z03.89] 03/20/2014  . PUD (peptic ulcer disease) [K27.9] 03/20/2014  . Type 2 diabetes, uncontrolled, with neuropathy ([place]) [E11.40, E11.65] 03/20/2014  . Morbid obesity due to excess calories ([place]) [E66.01] 03/20/2014  . Left-sided weakness [R53.1] 03/20/2014  . Paresthesias [R20.2] 03/20/2014  . Cerebrovascular disease [I67.9] 03/20/2014  . Dyspnea on exertion [R06.09] 03/05/2012  . Tobacco abuse [Z72.0] 03/05/2012  . Moderate COPD (chronic obstructive pulmonary disease) ([place]) [J44.9] 11/23/2011  . GERD (gastroesophageal reflux disease) [K21.9] 11/23/2011  . Uncontrolled hypertension [I10] 11/23/2011    Total Time spent with patient: 45 minutes  Subjective:   Katrina [name] is a 57 y.o. female patient presented to [facility] with complaints of dizziness and after having a MRI patient made a statement to provider that she was suicidal.  HPI:  Katrina [name], 57 y.o., female patient seen via telepsych by this provider; chart reviewed and consulted with Dr. Dwyane Dee on 02/08/18.  On evaluation Katrina [name] reports "I had a trauma and I said something to one person that I shouldn't have; that I didn't mean.  If I wanted to kill myself I wouldn't tell nobody.  I didn't tell nobody before when I slit my wrist.  I was just upset and made a statement."  Patient denies suicidal/self-harm/homicidal ideation, psychosis, and paranoia.  Patient states that she has a history of prior  suicide attempt in 2000 via cutting her wist and received inpatient psychiatric treatment.  States that she has been on psychotropic medication but not currently and doesn't feel that she needs medication now or that she needs outpatient psychiatric services.  Patient states she is agitated because she wants to go home. "I'll feel better when I'm out of here."  Patient states she lives with her daughter and son in law; they stay upstairs and I stay in the basement. They have their privacy and I have mine." During evaluation Katrina [name] is sitting in chair at window.  At the beginning of assessment patient had covers pulled over her head and stated that she was not going to remove them during assessment.  After talking to patient for a while she gradually pulled the covers down and participated in assessment.  Patient is alert/oriented x 4; calm/cooperative during the assessment but snappy with answers and some irritability.  Patients mood is congruent with affect.  She does not appear to be responding to internal/external stimuli or delusional thoughts; and denies suicidal/self-harm/homicidal ideation, psychosis, and paranoia.  Patient answered question appropriately.    Past Psychiatric History: Prior inpatient and outpatient psychiatric treatment; but not currently; prior suicide attempt.  Depression  Risk to Self: Suicidal Ideation: Yes-Currently Present Suicidal Intent: Yes-Currently Present Is patient at risk for suicide?: Yes Suicidal Plan?: No Access to Means: Yes Specify Access to Suicidal Means: access at home What has been your use of drugs/alcohol within the last 12 months?: hx of alcohol use How many times?: 3 Triggers for Past Attempts: Unknown, Unpredictable Intentional Self Injurious Behavior: None Risk to Others: Homicidal Ideation: No Thoughts of Harm to Others: No Current Homicidal Intent: No Current Homicidal Plan: No Access to Homicidal Means: No History of harm to  others?: No Assessment of Violence: None Noted Does patient have access to weapons?: No Criminal Charges Pending?: No Does patient have a court date: No Prior Inpatient Therapy: Prior Inpatient Therapy: Yes Prior Therapy Dates: total of 3 times with last time in 2000 Prior Therapy Facilty/Provider(s): pt doesn't know names Reason for Treatment: depression Prior Outpatient Therapy: Prior Outpatient Therapy: No Does patient have an ACCT team?: No Does patient have Intensive In-House Services?  : No Does patient have [facility] services? : No Does patient have P4CC services?: No  Past Medical History:  Past Medical History:  Diagnosis Date  . Allergy   . Anemia 1975-1976   . Anxiety    takes Valium daily as needed  . Arthritis    "spine" (12/03/2016)  . Asthma    has inhalers but doesn't use (12/03/2016)  . Chronic bronchitis ([place])    "get it a couple times q yr" (12/03/2016)  . Chronic kidney disease   . Chronic lower back pain    budlging disc   . Claustrophobia   . COPD (chronic obstructive pulmonary disease) ([place])   . Daily headache   . Depression   . Diverticulitis   . Family history of adverse reaction to anesthesia    2 daughters gets extremely sick   . GERD (gastroesophageal reflux disease)    takes Dexilant daily  . Heart murmur   . History of blood transfusion 1975-1976 "several"   no abnormal reaction noted  . History of colitis   . History of colon polyps    benign  . History of gastric ulcer   .  History of hiatal hernia    "small one"  . History of MRSA infection 2017  . Hyperlipidemia    was on meds but has been off over a yr  . Hypertension    takes Amlodipine and Maxzide daily  . Insomnia    takes Ambien nightly  . Iron deficiency anemia    "when I was young"  . Lung nodules   . Migraine    "2-3/wk" (12/03/2016)  . MS (multiple sclerosis) ([place])    questionable per pt  . Noncompliance   . Osteoporosis   . Peripheral neuropathy     weakness,numbness,and tingling. Takes Gabapentin daily  . PTSD (post-traumatic stress disorder) dx'd 2016/2017   "was dx'd w/bipolar in 1991; replaced w/PTSD dx 2016/2017" (12/03/2016)  . Restless leg syndrome    takes Requip daily  . 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 Type II diabetes mellitus ([place])    "went off insulin in 2012/2013" (12/03/2016)    Past Surgical History:  Procedure Laterality Date  . ABDOMINAL AORTOGRAM N/A 12/03/2016   Procedure: Abdominal Aortogram;  Surgeon: Angelia Mould, MD;  Location: [facility];  Service: Cardiovascular;  Laterality: N/A;  . ABDOMINAL HYSTERECTOMY  12/1987  . ADENOIDECTOMY  1975  . ANKLE SURGERY Bilateral 1993; 1995 X2   "stabilzation done; 1 on the right; 2 on the left"  . APPENDECTOMY  1989  . BREAST EXCISIONAL BIOPSY Left   . BREAST LUMPECTOMY Left    "benign tumor"  . CARDIAC CATHETERIZATION N/A 01/31/2015   Procedure: Left Heart Cath and Coronary Angiography;  Surgeon: Burnell Blanks, MD;  Location: [facility];  Service: Cardiovascular;  Laterality: N/A;  . COLONOSCOPY    . DILATION AND CURETTAGE OF UTERUS    . ESOPHAGOGASTRODUODENOSCOPY    . LAPAROSCOPIC CHOLECYSTECTOMY  1996  . NASAL RECONSTRUCTION  1976  . NASAL SINUS SURGERY  1975  . POSTERIOR LUMBAR FUSION  2005  . RADIOLOGY WITH ANESTHESIA N/A 01/15/2016   Procedure: MRI LUMBAR SPINE WITHOUT;  Surgeon: Medication Radiologist, MD;  Location: [facility];  Service: Radiology;  Laterality: N/A;  . RADIOLOGY WITH ANESTHESIA N/A 06/29/2016   Procedure: MRI OF THE BRAIN WITH AND WITHOUT;  Surgeon: Medication Radiologist, MD;  Location: [facility];  Service: Radiology;  Laterality: N/A;  . RENAL ANGIOGRAPHY N/A 12/03/2016   Procedure: Renal Angiography;  Surgeon: Angelia Mould, MD;  Location: [facility];  Service: Cardiovascular;  Laterality: N/A;  . TONSILLECTOMY   1992  . TUBAL LIGATION    . TUMOR EXCISION  1998   from back of skull; developed;  MRSA from the area that had to be packed   Family History:  Family History  Problem Relation Age of Onset  . Coronary artery disease Father   . Emphysema Father   . Heart attack Father 52       Died age 37  . Stroke Father   . Cancer Father        Unsure of type   . Allergic rhinitis Father   . Depression Mother   . Skin cancer Mother   . Bipolar disorder Brother   . Drug abuse Brother   . Leukemia Brother   . Bipolar disorder Daughter   . Allergic rhinitis Daughter   . Asthma Daughter   . Urticaria Daughter   . Diabetes Maternal Grandmother   . Stomach  cancer Maternal Grandfather   . Heart attack Sister        Died in 79s  . Heart attack Brother        Died age 38  . Stomach cancer Other   . Liver disease Cousin   . Angioedema Neg Hx   . Atopy Neg Hx   . Eczema Neg Hx   . Immunodeficiency Neg Hx   . Breast cancer Neg Hx   . Colon cancer Neg Hx    Family Psychiatric  History: See above list Social History:  Social History   Substance and Sexual Activity  Alcohol Use No  . Alcohol/week: 0.0 standard drinks     Social History   Substance and Sexual Activity  Drug Use No    Social History   Socioeconomic History  . Marital status: Divorced    Spouse name: Not on file  . Number of children: 3  . Years of education: Some college  . Highest education level: Not on file  Occupational History    Comment: Disability for back pain  Social Needs  . Financial resource strain: Not on file  . Food insecurity:    Worry: Not on file    Inability: Not on file  . Transportation needs:    Medical: Not on file    Non-medical: Not on file  Tobacco Use  . Smoking status: Current Every Day Smoker    Packs/day: 1.50    Years: 45.00    Pack years: 67.50    Types: Cigarettes    Start date: 03/04/1971  . Smokeless tobacco: Never Used  Substance and Sexual Activity  . Alcohol use: No     Alcohol/week: 0.0 standard drinks  . Drug use: No  . Sexual activity: Never    Birth control/protection: Surgical  Lifestyle  . Physical activity:    Days per week: Not on file    Minutes per session: Not on file  . Stress: Not on file  Relationships  . Social connections:    Talks on phone: Not on file    Gets together: Not on file    Attends religious service: Not on file    Active member of club or organization: Not on file    Attends meetings of clubs or organizations: Not on file    Relationship status: Not on file  Other Topics Concern  . Not on file  Social History Narrative   Lives at home with her daughter.   Right-handed.   1-2 cups coffee in the morning and 2 sodas per day.   Additional Social History:    Allergies:   Allergies  Allergen Reactions  . Other Anaphylaxis and Swelling    [place]  . Penicillins Anaphylaxis    Has patient had a PCN reaction causing immediate rash, facial/tongue/throat swelling, SOB or lightheadedness with hypotension: Yes Has patient had a PCN reaction causing severe rash involving mucus membranes or skin necrosis: Yes Has patient had a PCN reaction that required hospitalization Yes Has patient had a PCN reaction occurring within the last 10 years: No If all of the above answers are "NO", then may proceed with Cephalosporin use.   . Zithromax [Azithromycin] Anaphylaxis  . Aspirin Other (See Comments)    Due to stomach ulcers.   . Pineapple Rash  . Strawberry Extract Rash and Hives  . Aspartame And Phenylalanine Palpitations  . Mushroom Extract Complex Rash  . Nicardipine Nausea And Vomiting and Other (See Comments)    shaking  Labs:  Results for orders placed or performed during the hospital encounter of 02/05/18 (from the past 48 hour(s))  Troponin I     Status: None   Collection Time: 02/06/18  1:47 PM  Result Value Ref Range   Troponin I <0.03 <0.03 ng/mL    Comment: Performed at [facility], [address],  [place], [place] [address]  Glucose, capillary     Status: Abnormal   Collection Time: 02/06/18  5:01 PM  Result Value Ref Range   Glucose-Capillary 130 (H) 70 - 99 mg/dL   Comment 1 Notify RN    Comment 2 Document in Chart   Glucose, capillary     Status: Abnormal   Collection Time: 02/06/18  9:16 PM  Result Value Ref Range   Glucose-Capillary 157 (H) 70 - 99 mg/dL  HIV antibody (Routine Testing)     Status: None   Collection Time: 02/07/18  6:23 AM  Result Value Ref Range   HIV Screen 4th Generation wRfx Non Reactive Non Reactive    Comment: (NOTE) Performed At: [facility] [place], [place] [id number] Rush Farmer MD [id number]   CBC     Status: None   Collection Time: 02/07/18  6:23 AM  Result Value Ref Range   WBC 7.7 4.0 - 10.5 K/uL   RBC 4.40 3.87 - 5.11 MIL/uL   Hemoglobin 12.8 12.0 - 15.0 g/dL   HCT 40.0 36.0 - 46.0 %   MCV 90.9 78.0 - 100.0 fL   MCH 29.1 26.0 - 34.0 pg   MCHC 32.0 30.0 - 36.0 g/dL   RDW 14.9 11.5 - 15.5 %   Platelets 225 150 - 400 K/uL    Comment: Performed at [facility], [address], [place], [place] [address]  Basic metabolic panel     Status: Abnormal   Collection Time: 02/07/18  6:23 AM  Result Value Ref Range   Sodium 142 135 - 145 mmol/L   Potassium 3.8 3.5 - 5.1 mmol/L   Chloride 110 98 - 111 mmol/L   CO2 26 22 - 32 mmol/L   Glucose, Bld 91 70 - 99 mg/dL   BUN 15 6 - 20 mg/dL   Creatinine, Ser 0.85 0.44 - 1.00 mg/dL   Calcium 8.7 (L) 8.9 - 10.3 mg/dL   GFR calc non Af Amer >60 >60 mL/min   GFR calc Af Amer >60 >60 mL/min    Comment: (NOTE) The eGFR has been calculated using the CKD EPI equation. This calculation has not been validated in all clinical situations. eGFR's persistently <60 mL/min signify possible Chronic Kidney Disease.    Anion gap 6 5 - 15    Comment: Performed at [facility], [address] [place], [place] [address]  Glucose, capillary     Status: None   Collection Time: 02/07/18  7:50  AM  Result Value Ref Range   Glucose-Capillary 95 70 - 99 mg/dL  Glucose, capillary     Status: Abnormal   Collection Time: 02/07/18 11:22 AM  Result Value Ref Range   Glucose-Capillary 118 (H) 70 - 99 mg/dL  Glucose, capillary     Status: Abnormal   Collection Time: 02/07/18  4:42 PM  Result Value Ref Range   Glucose-Capillary 108 (H) 70 - 99 mg/dL  Glucose, capillary     Status: None   Collection Time: 02/08/18  8:43 AM  Result Value Ref Range   Glucose-Capillary 83 70 - 99 mg/dL   Comment 1 Notify RN  Comment 2 Document in Chart   Glucose, capillary     Status: Abnormal   Collection Time: 02/08/18 12:13 PM  Result Value Ref Range   Glucose-Capillary 185 (H) 70 - 99 mg/dL    Medications:  Current Facility-Administered Medications  Medication Dose Route Frequency Provider Last Rate Last Dose  . acetaminophen (TYLENOL) tablet 650 mg  650 mg Oral Q6H PRN Reubin Milan, MD       Or  . acetaminophen (TYLENOL) suppository 650 mg  650 mg Rectal Q6H PRN Reubin Milan, MD      . amLODipine [facility]) tablet 10 mg  10 mg Oral Daily Reubin Milan, MD   10 mg at 02/07/18 6195  . busPIRone (BUSPAR) tablet 5 mg  5 mg Oral BID PRN Nanci Pina, FNP      . chlorthalidone (HYGROTON) tablet 25 mg  25 mg Oral Daily Reubin Milan, MD   25 mg at 02/07/18 0932  . cloNIDine (CATAPRES) tablet 0.1 mg  0.1 mg Oral TID Elodia Florence., MD      . clopidogrel (PLAVIX) tablet 75 mg  75 mg Oral Daily Reubin Milan, MD   75 mg at 02/07/18 6712  . gabapentin (NEURONTIN) capsule 600 mg  600 mg Oral QID Reubin Milan, MD   600 mg at 02/07/18 2300  . haloperidol lactate (HALDOL) injection 1 mg  1 mg Intravenous Q6H PRN Elodia Florence., MD      . heparin injection 5,000 Units  5,000 Units Subcutaneous Q8H Reubin Milan, MD      . hydrALAZINE (APRESOLINE) tablet 100 mg  100 mg Oral TID Reubin Milan, MD   100 mg at 02/07/18 1716  . isosorbide  mononitrate (IMDUR) 24 hr tablet 30 mg  30 mg Oral Daily Reubin Milan, MD   30 mg at 02/07/18 4580  . labetalol (NORMODYNE,TRANDATE) injection 10 mg  10 mg Intravenous Q2H PRN Elodia Florence., MD      . LORazepam (ATIVAN) injection 1 mg  1 mg Intravenous Q6H PRN Elodia Florence., MD   1 mg at 02/07/18 1727  . losartan (COZAAR) tablet 50 mg  50 mg Oral BID Reubin Milan, MD   50 mg at 02/07/18 9983  . metoprolol succinate (TOPROL-XL) 24 hr tablet 100 mg  100 mg Oral Daily Reubin Milan, MD   100 mg at 02/07/18 3825  . nicotine (NICODERM CQ - dosed in mg/24 hours) patch 21 mg  21 mg Transdermal Daily Reubin Milan, MD   21 mg at 02/07/18 0539  . ondansetron (ZOFRAN) tablet 4 mg  4 mg Oral Q6H PRN Reubin Milan, MD       Or  . ondansetron [facility]) injection 4 mg  4 mg Intravenous Q6H PRN Reubin Milan, MD   4 mg at 02/07/18 7673  . pantoprazole (PROTONIX) EC tablet 40 mg  40 mg Oral Daily Reubin Milan, MD   40 mg at 02/07/18 4193  . rOPINIRole (REQUIP) tablet 4 mg  4 mg Oral QHS Reubin Milan, MD   4 mg at 02/07/18 2127  . zolpidem (AMBIEN) tablet 5 mg  5 mg Oral QHS PRN Reubin Milan, MD   5 mg at 02/07/18 2302    Musculoskeletal: Strength & Muscle Tone: within normal limits Gait & Station: normal Patient leans: N/A  Psychiatric Specialty Exam: Physical Exam  Nursing note and vitals reviewed. Constitutional:  She is oriented to person, place, and time.  Respiratory: Effort normal.  Neurological: She is alert and oriented to person, place, and time.    Review of Systems  Neurological: Dizziness: Improved. Headaches: Improved.       Prior history of stroke  Psychiatric/Behavioral: Depression: Stable. Hallucinations: Denies. Suicidal ideas: Denies. Nervous/anxious: Denies. Insomnia: Denies.   All other systems reviewed and are negative.   Blood pressure (!) 208/102, pulse (!) 104, temperature 98.3 F (36.8 C), temperature  source Oral, resp. rate 19, height '5\' 4"'  (1.626 m), weight 99.4 kg, SpO2 95 %.Body mass index is 37.61 kg/m.  General Appearance: Casual  Eye Contact:  Fair  Speech:  Clear and Coherent and Normal Rate  Volume:  Normal  Mood:  Appropriate  Affect:  Appropriate and Congruent  Thought Process:  Coherent and Goal Directed  Orientation:  Full (Time, Place, and Person)  Thought Content:  Logical  Suicidal Thoughts:  No  Homicidal Thoughts:  No  Memory:  Immediate;   Good Recent;   Good Remote;   Good  Judgement:  Intact  Insight:  Present  Psychomotor Activity:  Normal  Concentration:  Concentration: Good and Attention Span: Good  Recall:  Good  Fund of Knowledge:  Good  Language:  Good  Akathisia:  No  Handed:  Right  AIMS (if indicated):     Assets:  Communication Skills Desire for Improvement Housing Social Support  ADL's:  Intact  Cognition:  WNL  Sleep:        Treatment Plan Summary: Plan Referral to outpatient psychiatric services   Recommendation: Give resource information for outpatient psychiatric services  Disposition:  Patient psychiatrically cleared.   No evidence of imminent risk to self or others at present.   Patient does not meet criteria for psychiatric inpatient admission. Supportive therapy provided about ongoing stressors. Discussed crisis plan, support from social network, calling 911, coming to the Emergency Department, and calling Suicide Hotline.  Spoke with Dr. Crisoforo Oxford to inform of above recommendation and disposition and she had concerns to whether patient was competent to make medical decision related to patient has history or prior stroke, complaints of dizziness and headaches and is refusing to take her medications.  Wants to make sure that patient is aware of what could happen if she refuses to take her medication putting her at increased risk for another stroke or death.    Spoke to patient via tele psych and informed of doctors concerns.   Patient states "I'm done with this place and I am not taking anymore of their medication.  I stopped last night.  They are keeping me prisoner here and I haven't made not one decision of my own since I been here.  It all started with that damn MRI.  I told them that I could not have the MRI because of PTSD; past trauma.  It makes me feel trapped in the dark; like being locked in a dark closet. But guess what they did the MRI and wonder why I was telling them I was done.  Now I here in this hospital and they won't let me leave.  I am done with them."  Patient asked if she was aware of what could happen if she did not take her medications.  Patient admitted to prior stroke. When informed if she did not take her medication it could lead to another stroke or death.  Patient voiced her understanding stating "I was taking my medicine at home.  I  just stopped last night.  They are not going to make another discission for me.  If I want to take my medicine I will; If I don't want to I won't; but it won't be anybody making that decision for me."  Patient states that she is not refusing medications as a suicide attempt "I don't want to die but nobody will tell me what I have to do.  I make my own discussions." Patient states that she is not say "If I go home I'm not going to take my medicine; If I choose to take them it will be my discission."   As stated above in prior assessment patient is oriented x 4; patient voices her understanding of her medical health, prior history, and consequences of not taking her medications.  Patient is refusing to take medications while in hospital because she states she feels she is a prisoner and has been unable to make her own discission.  Patient continues to deny suicidal/self-harm/homicidal ideation, psychosis, and paranoia.  Will continue with current recommendation and disposition or patient psychiatrically cleared and resources for outpatient psychiatric services.  Also recommend  educational resources DM and other Chronic illnesses.    This service was provided via telemedicine using a 2-way, interactive audio and video technology.  Names of all persons participating in this telemedicine service and their role in this encounter. Name: Earleen [place], NP Role: Tele psych Assessment  Name: Dr. Dwyane Dee Role: Psychiatrist  Name: Katrina [name] Role: Patient  Name:  Role:     12:54 [id number] to call Dr. Crisoforo Oxford at [phone] and got no answer; Called nursing station and spoke with Sharl Ma who transferred to RN and no answer; informed that both were unavailable at the time.  Informed that either could call me back or could just read note.      Shuvon Rankin, NP 02/08/2018 12:54 PM
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
## 2018-02-08 NOTE — Evaluation (Signed)
Physical Therapy Evaluation Patient Details Name: Katrina [name] MRN: [id number] DOB: Nov 22, 1960 Today's Date: 02/08/2018   History of Present Illness  Katrina [name] is a 57 y.o. female with medical history significant of allergy, anemia, anxiety, osteoarthritis of the spine, COPD, chronic kidney disease, chronic lower back pain, claustrophobia, depression, insomnia, PTSD, restless leg syndrome, GERD, type 2 diabetes, lung nodules, tobacco use, history of multiple TIAs, medication and medical treatment noncompliance who is coming to the emergency department with complaints of headache and dizziness since Saturday.  She has also had dyspnea and non-radiated, pressure-like chest pain associated with palpitations.  No PND or orthopnea, but complains of occasional pitting edema of the lower extremities.  She denies fever, chills, sore throat, but has occasional wheezing and frequent productive cough.  Denies abdominal pain, vomiting, constipation.  However has felt nauseous and has had some diarrhea over the weekend.  Denies dysuria, frequency or hematuria.  Denies polyuria, polydipsia or blurred vision.    Clinical Impression  Patient observed ambulating in room and transferring to bed and chair without problem.  Patient refused to walk out of room or participate with any physical therapy.  Plan:  Patient discharged from physical therapy to care of nursing for ambulation daily as tolerated for length of stay.    Follow Up Recommendations No PT follow up    Equipment Recommendations  None recommended by PT    Recommendations for Other Services       Precautions / Restrictions Precautions Precautions: None Restrictions Weight Bearing Restrictions: No      Mobility  Bed Mobility               General bed mobility comments: patient presents standing in room  Transfers Overall transfer level: Independent Equipment used: None             General transfer comment: patient  observed transferring from bed side to chair without assistance  Ambulation/Gait Ambulation/Gait assistance: Modified independent (Device/Increase time) Gait Distance (Feet): 10 Feet Assistive device: None Gait Pattern/deviations: WFL(Within Functional Limits) Gait velocity: decreased   General Gait Details: appears to have no problems walking in room  Stairs            Wheelchair Mobility    Modified Rankin (Stroke Patients Only)       Balance Overall balance assessment: No apparent balance deficits (not formally assessed)                                           Pertinent Vitals/Pain Pain Assessment: Faces Faces Pain Scale: No hurt Pain Location: patient would not state Pain Intervention(s): Limited activity within patient's tolerance;Monitored during session    [place] expects to be discharged to:: Private residence Living Arrangements: Children Available Help at Discharge: Family Type of Home: House Home Access: Level entry     Home Layout: Two level;Laundry or work area in [place]: Kasandra Knudsen - single point;Walker - 2 wheels Additional Comments: Patient states she lives in the basement and go upstairs to the main unit to use the bathroom    Prior Function Level of Independence: Independent with assistive device(s)         Comments: household ambulator with SPC or RW     Hand Dominance   Dominant Hand: Right    Extremity/Trunk Assessment   Upper Extremity Assessment Upper Extremity Assessment: Overall WFL for  tasks assessed    Lower Extremity Assessment Lower Extremity Assessment: Overall WFL for tasks assessed    Cervical / Trunk Assessment Cervical / Trunk Assessment: Normal  Communication   Communication: No difficulties  Cognition Arousal/Alertness: Awake/alert Behavior During Therapy: Agitated Overall Cognitive Status: Within Functional Limits for tasks assessed                                         General Comments      Exercises     Assessment/Plan    PT Assessment Patent does not need any further PT services  PT Problem List         PT Treatment Interventions      PT Goals (Current goals can be found in the Care Plan section)  Acute Rehab PT Goals Patient Stated Goal: none stated PT Goal Formulation: With patient Time For Goal Achievement: 02/08/18 Potential to Achieve Goals: Good    Frequency     Barriers to discharge        Co-evaluation               AM-PAC PT "6 Clicks" Daily Activity  Outcome Measure Difficulty turning over in bed (including adjusting bedclothes, sheets and blankets)?: None Difficulty moving from lying on back to sitting on the side of the bed? : None Difficulty sitting down on and standing up from a chair with arms (e.g., wheelchair, bedside commode, etc,.)?: None Help needed moving to and from a bed to chair (including a wheelchair)?: None Help needed walking in hospital room?: None Help needed climbing 3-5 steps with a railing? : A Little 6 Click Score: 23    End of Session   Activity Tolerance: Patient tolerated treatment well Patient left: in chair;with call bell/phone within reach;with nursing/sitter in room Nurse Communication: Mobility status PT Visit Diagnosis: Unsteadiness on feet (R26.81);Other abnormalities of gait and mobility (R26.89);Muscle weakness (generalized) (M62.81)    Time: 3112-1624 PT Time Calculation (min) (ACUTE ONLY): 13 min   Charges:   PT Evaluation $PT Eval Low Complexity: 1 Low PT Treatments $Therapeutic Activity: 8-22 mins        2:08 PM, 02/08/18 Lonell Grandchild, MPT Physical Therapist with [facility] [phone] office [phone] mobile phone

## 2018-02-08 NOTE — Progress Notes (Signed)
Notified pt's daughter, [name], that IVC had been discontinued.

## 2018-02-08 NOTE — Progress Notes (Addendum)
Patient pulled out IV and will not allow a new one to be placed.  Will notify MD.

## 2018-02-09 DIAGNOSIS — I5189 Other ill-defined heart diseases: Secondary | ICD-10-CM | POA: Diagnosis not present

## 2018-02-09 DIAGNOSIS — I160 Hypertensive urgency: Secondary | ICD-10-CM | POA: Diagnosis not present

## 2018-02-09 DIAGNOSIS — F4329 Adjustment disorder with other symptoms: Secondary | ICD-10-CM

## 2018-02-09 DIAGNOSIS — I6521 Occlusion and stenosis of right carotid artery: Secondary | ICD-10-CM

## 2018-02-09 DIAGNOSIS — F411 Generalized anxiety disorder: Secondary | ICD-10-CM | POA: Diagnosis not present

## 2018-02-09 LAB — GLUCOSE, CAPILLARY
GLUCOSE-CAPILLARY: 98 mg/dL (ref 70–99)
GLUCOSE-CAPILLARY: 104 mg/dL — AB (ref 70–99)

## 2018-02-09 MED ORDER — CLONIDINE HCL 0.2 MG/24HR TD PTWK: 0.2000 mg | MEDICATED_PATCH | TRANSDERMAL | 11 refills | Status: DC

## 2018-02-09 NOTE — Progress Notes (Signed)
Patient ready for discharge home. Reviewed all discharge medications and f/u appointments. Patient states" I will not take my medications, I will throw them away, I do not care about my blood pressure. "

## 2018-02-09 NOTE — Discharge Summary (Signed)
Physician Discharge Summary  Katrina [name] NLG:[id number] DOB: Apr 27, 1961 DOA: 02/05/2018  PCP: [name], FNP  Admit date: 02/05/2018 Discharge date: 02/09/2018  Admitted From: home  Disposition:  Home   Recommendations for Outpatient Follow-up:  1. Follow up with PCP in 1-2 weeks 2. Follow up with your psychiatrist in 1-2 weeks 3. Take blood pressure medications as scheduled 4. Low salt diet 5. Smoking cessation   Home Health:no  Equipment/Devices:none  Discharge Condition:stable  CODE STATUS:full   Diet recommendation: low salt diet   Brief/Interim Summary: Katrina L Hankinsis a 57 y.o.femalewith medical history significant ofallergy, anemia, anxiety, osteoarthritis of the spine, COPD, chronic kidney disease, chronic lower back pain, claustrophobia, depression, insomnia, PTSD, restless leg syndrome, GERD, type 2 diabetes, lung nodules, tobacco use, history of multiple TIAs, medication and medical treatment noncompliance who is coming to the emergency department with complaints of headache and dizziness 1 day prior. She has also had dyspnea and non-radiated, pressure-like chest pain associated with palpitations. No PND or orthopnea, but complains of occasional pitting edema of the lower extremities. pt was found to have hypertensive urgency, however, BP is hard to treat as she's refusing to take BP medications that she does not want to take such as Toprol Xl, losartan, clonidine tablet, or diuretics. Finally she agreed to take hydralazine tablets, clonidine patch, and Norvasc. As long as she's willing to take some BP medications, her BP is nicely controlled. Her fundamental problem is noncompliant.   For her headache and dizziness, she has negative CT and MRI of brain. Carotid US only reveals right external carotid stenosis >70% which is not clinically significant given she has compensatory blood flow from bilateral internal carotid arteries and left external carotid artery and  basilar/vertebral arteries.   Psychiatry consulted due to pt's non-cooperative behavior and at one point claimed she wanted to hurt herself. She has anxiety and PTSD. She has outpatient psychiatry/BH follow up as well. Psychiatrist here cleared her with no impression of suicidal thoughts or attempt and reversed her IVC. Felt pt has adjustment disorder with disturbance of emotion. She missed her outpatient psychiatry followup while being hospitalized here, therefore will need to reschedule appointment post discharge.   Upon discharge, her BP is now 149/134, down from 218/122. High risk for readmission given adjustment disorder with emotion disturbance and medical noncompliance to BP meds.    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%    Discharge Instructions  Discharge Instructions    Call MD for:  persistant dizziness or light-headedness   Complete by:  As directed    Diet - low sodium heart healthy   Complete by:  As directed    Discharge instructions   Complete by:  As directed    Please see your primary care in 1 week and check your BP; you will be given an appointment date before discharge Please see your psychiatrist in 1-2 weeks and we will also help you to make appointment Please take the blood pressure medications  that you feel comfortable with such as amlodipine, hydralazine, and clonidine patch When your blood pressure is controlled, your headache and  dizziness will resolve as well Stop smoking please   Increase activity slowly   Complete by:  As directed      Allergies as of 02/09/2018      Reactions   Other Anaphylaxis, Swelling   [place]   Penicillins Anaphylaxis   Has patient had a PCN reaction causing immediate rash, facial/tongue/throat swelling, SOB or lightheadedness with hypotension: Yes Has patient had a PCN reaction causing severe rash involving mucus membranes or skin necrosis: Yes Has patient had a PCN reaction that required hospitalization Yes Has patient had a PCN reaction occurring within the last 10 years: No If all of the above answers are "NO", then may proceed with Cephalosporin use.   Zithromax [azithromycin] Anaphylaxis   Aspirin Other (See Comments)   Due to stomach ulcers.    Pineapple Rash   Strawberry Extract Rash, Hives   Aspartame And Phenylalanine Palpitations   Mushroom Extract Complex Rash   Nicardipine Nausea And Vomiting, Other (See Comments)   shaking      Medication List    STOP taking these medications   atenolol 25 MG tablet Commonly known as:  TENORMIN   chlorthalidone 25 MG tablet Commonly known as:  HYGROTON   isosorbide mononitrate 30 MG 24 hr tablet Commonly known as:  IMDUR   losartan 50 MG tablet Commonly known as:  COZAAR     TAKE these medications   amLODipine 10 MG tablet Commonly known as:  NORVASC Take 1 tablet (10 mg total) by mouth daily.   cloNIDine 0.2 mg/24hr patch Commonly known as:  CATAPRES - Dosed in mg/24 hr Place 1 patch (0.2 mg total) onto the skin every 7 (seven) days.   clopidogrel 75 MG tablet Commonly known as:  PLAVIX Take 75 mg by mouth daily.   dexlansoprazole 60 MG capsule Commonly known as:  DEXILANT Take 1 tab by mouth every morning. What changed:    how much to take  how to take this  when to take this  additional instructions   EPIPEN 2-PAK 0.3 mg/0.3 mL Soaj injection Generic drug:  EPINEPHrine Inject into the muscle.    EXCEDRIN EXTRA STRENGTH 250-250-65 MG tablet Generic drug:  aspirin-acetaminophen-caffeine Take 2 tablets by mouth daily as needed for headache or migraine.   gabapentin 600 MG tablet Commonly known as:  NEURONTIN Take 600 mg by mouth 4 (four) times daily.   hydrALAZINE 100 MG tablet Commonly known as:  APRESOLINE Take 1 tablet (100 mg total) by mouth 3 (three) times daily.   rOPINIRole 4 MG tablet Commonly known as:  REQUIP Take 4 mg by mouth at bedtime.   zolpidem 12.5 MG CR tablet Commonly known as:  AMBIEN CR Take 12.5 mg by mouth at bedtime.      Follow-up Information    [name], FNP Follow up in 1 week(s).   Specialty:  Family Medicine Why:  check BP and make medication dosage adjustment Contact information: [place] [address] [phone]        Herminio Commons, MD .   Specialty:  Cardiology Contact information: [place] [address] [phone]        Norman Clay, MD Follow up in 1 week(s).   Specialty:  Psychiatry Why:  missing appointment due to hospitalization, will need to be evaluated post discharge in 1-2  weeks Contact information: [address] [place] [address] [phone]          Allergies  Allergen Reactions  . Other Anaphylaxis and Swelling    [place]  . Penicillins Anaphylaxis    Has patient had a PCN reaction causing immediate rash, facial/tongue/throat swelling, SOB or lightheadedness with hypotension: Yes Has patient had a PCN reaction causing severe rash involving mucus membranes or skin necrosis: Yes Has patient had a PCN reaction that required hospitalization Yes Has patient had a PCN reaction occurring within the last 10 years: No If all of the above answers are "NO", then may proceed with Cephalosporin use.   . Zithromax [Azithromycin] Anaphylaxis  . Aspirin Other (See Comments)    Due to stomach ulcers.   . Pineapple Rash  . Strawberry  Extract Rash and Hives  . Aspartame And Phenylalanine Palpitations  . Mushroom Extract Complex Rash  . Nicardipine Nausea And Vomiting and Other (See Comments)    shaking    Consultations:  psychiatry   Procedures/Studies: Dg Chest 2 View  Result Date: 02/06/2018 CLINICAL DATA:  Headache and dizziness x1 day. New dyspnea. Positive smoking history. EXAM: CHEST - 2 VIEW COMPARISON:  None. FINDINGS: Hyperinflated lungs with likely smoking related bronchitic change. Heart and mediastinal contours are normal in size. No aneurysm. No acute osseous abnormality. Chronic tapered appearance of the distal right clavicle at the [facility] joint. IMPRESSION: Bronchitic change of the lungs.  Mild pulmonary hyperinflation. Electronically Signed   By: Ashley Royalty M.D.   On: 02/06/2018 01:02   Ct Head Wo Contrast  Result Date: 02/06/2018 CLINICAL DATA:  Headache and dizziness x1 day. EXAM: CT HEAD WITHOUT CONTRAST TECHNIQUE: Contiguous axial images were obtained from the base of the skull through the vertex without intravenous contrast. COMPARISON:  12/20/2017 FINDINGS: Brain: Slightly heterogeneous appearance of the occipital lobes at the gray-white matter juncture is similar to prior that can be seen with hypertensive posterior reversible encephalopathy syndrome. Findings are similar to that prior exam. No acute intracranial hemorrhage, midline shift or edema. No hydrocephalus. Chronic minimal small vessel ischemic disease is noted. Midline fourth ventricle and basal cisterns. No intra-axial mass nor extra-axial fluid collections. Midline fourth ventricle and basal cisterns without effacement. Cerebellum and brainstem are nonacute. Vascular: No hyperdense vessel sign. Skull: Acute skull fracture or suspicious osseous lesions. Sinuses/Orbits: Intact Other: None IMPRESSION: Slightly heterogeneous appearance of the occipital lobes may reflect posterior reversible encephalopathy/hypertensive encephalopathy similar prior.  Minimal small vessel ischemic disease. No acute intracranial abnormality. Electronically Signed   By: Ashley Royalty M.D.   On: 02/06/2018 01:07   Mr Brain Wo Contrast  Result Date: 02/07/2018 CLINICAL DATA:  57 year old female with altered mental status, weakness. EXAM: MRI HEAD WITHOUT CONTRAST TECHNIQUE: Multiplanar, multiecho pulse sequences of the brain and surrounding structures were obtained without intravenous contrast. COMPARISON:  Head CTs without contrast 02/06/2018 and earlier. Brain MRI 08/20/2017. FINDINGS: Brain: No restricted diffusion to suggest acute infarction. No midline shift, mass effect, evidence of mass lesion, ventriculomegaly, extra-axial collection or acute intracranial hemorrhage. Cervicomedullary junction and pituitary are within normal limits. No signal abnormality in the occipital lobes. Scattered bilateral cerebral white matter T2 and FLAIR hyperintensity redemonstrated and mostly subcortical. Mild T2 heterogeneity in the pons is more conspicuous today (left pons series 8, image 9), but stable. No cortical encephalomalacia or chronic cerebral blood products identified. The deep gray matter nuclei and cerebellum remain normal. Vascular: Major intracranial vascular flow voids are stable. Dominant right vertebral artery.  Skull and upper cervical spine: Negative visible cervical spine. Visualized bone marrow signal is within normal limits. Sinuses/Orbits: Trace paranasal sinus mucosal thickening. Stable and negative orbits soft tissues. Other: Mild right mastoid effusion has regressed. There is a small volume of layering fluid/secretions in the nasopharynx today. Visible internal auditory structures appear normal. Scalp and face soft tissues appear negative. IMPRESSION: 1.  No acute intracranial abnormality. 2. No signal abnormality in the occipital lobes to correspond to that recently questioned on CT. Nonspecific signal changes in the cerebral white matter, and to a lesser extent the  pons, have not significantly changed. Most commonly this would reflect chronic small vessel disease. Electronically Signed   By: Genevie Ann M.D.   On: 02/07/2018 09:35   US Carotid Bilateral  Result Date: 02/07/2018 CLINICAL DATA:  57 year old female with a carotid bruit EXAM: BILATERAL CAROTID DUPLEX ULTRASOUND TECHNIQUE: Pearline Cables scale imaging, color Doppler and duplex ultrasound were performed of bilateral carotid and vertebral arteries in the neck. COMPARISON:  Prior thyroid ultrasound 03/16/2017 FINDINGS: Criteria: Quantification of carotid stenosis is based on velocity parameters that correlate the residual internal carotid diameter with NASCET-based stenosis levels, using the diameter of the distal internal carotid lumen as the denominator for stenosis measurement. The following velocity measurements were obtained: RIGHT ICA: 90/31 cm/sec CCA: 10/25 cm/sec SYSTOLIC ICA/CCA RATIO:  1.3 ECA:  257 cm/sec LEFT ICA: 87/29 cm/sec CCA: 85/27 cm/sec SYSTOLIC ICA/CCA RATIO:  1.0 ECA:  129 cm/sec RIGHT CAROTID ARTERY: Heterogeneous atherosclerotic plaque in the carotid bifurcation extending into the proximal internal and external carotid arteries. By peak systolic velocity criteria, the internal carotid artery stenosis remains less than 50%. There is significant elevation of the peak systolic velocity in the external carotid artery consistent with a greater than 70% diameter stenosis. RIGHT VERTEBRAL ARTERY:  Patent with normal antegrade flow. LEFT CAROTID ARTERY: Mild smooth heterogeneous atherosclerotic plaque in the proximal internal carotid artery. By peak systolic velocity criteria, the estimated stenosis remains less than 50%. LEFT VERTEBRAL ARTERY:  Unable to visualize. IMPRESSION: 1. Mild (1-49%) stenosis proximal right internal carotid artery secondary to heterogenous atherosclerotic plaque. 2. Mild (1-49%) stenosis proximal left internal carotid artery secondary to heterogenous atherosclerotic plaque. 3.  High-grade (greater than 70%) stenosis of the right external carotid artery. While unlikely to be clinically significant, this may account for the physical exam finding of bruit. 4. The right vertebral artery is patent with normal antegrade flow. 5. The left vertebral artery is not visualized and may be hypoplastic or occluded. Signed, Criselda Peaches, MD, [facility] Radiology Electronically Signed   By: Jacqulynn Cadet M.D.   On: 02/07/2018 14:23      Subjective:   Discharge Exam: Vitals:   02/08/18 2216 02/09/18 0528  BP: (!) 218/122 (!) 149/134  Pulse: 90 92  Resp: 16 18  Temp: 98.6 F (37 C) 98.4 F (36.9 C)  SpO2: 96% 96%   Vitals:   02/08/18 0945 02/08/18 1605 02/08/18 2216 02/09/18 0528  BP: (!) 208/102 (!) 182/91 (!) 218/122 (!) 149/134  Pulse: (!) 104 85 90 92  Resp: 19  16 18   Temp: 98.3 F (36.8 C) 98.6 F (37 C) 98.6 F (37 C) 98.4 F (36.9 C)  TempSrc: Oral Oral Oral Oral  SpO2: 95% 98% 96% 96%  Weight:      Height:        General: Pt is alert, awake, not in acute distress Cardiovascular: RRR, S1/S2 +, no rubs, no  gallops Respiratory: CTA bilaterally, no wheezing, no rhonchi Abdominal: Soft, NT, ND, bowel sounds + Extremities: no edema, no cyanosis    The results of significant diagnostics from this hospitalization (including imaging, microbiology, ancillary and laboratory) are listed below for reference.     Microbiology: Recent Results (from the past 240 hour(s))  MRSA PCR Screening     Status: None   Collection Time: 02/06/18  7:02 AM  Result Value Ref Range Status   MRSA by PCR NEGATIVE NEGATIVE Final    Comment:        The GeneXpert MRSA Assay (FDA approved for NASAL specimens only), is one component of a comprehensive MRSA colonization surveillance program. It is not intended to diagnose MRSA infection nor to guide or monitor treatment for MRSA infections. Performed at [facility], [address], [place], [place] [address]      Labs: BNP (last 3 results) Recent Labs    03/20/17 0110 05/16/17 2050 02/06/18 0029  BNP 43.0 31.0 24.5   Basic Metabolic Panel: Recent Labs  Lab 02/06/18 0029 02/07/18 0623  NA 143 142  K 3.7 3.8  CL 109 110  CO2 26 26  GLUCOSE 119* 91  BUN 18 15  CREATININE 0.81 0.85  CALCIUM 9.0 8.7*  MG 2.2  --   PHOS 2.9  --    Liver Function Tests: Recent Labs  Lab 02/06/18 0029  AST 15  ALT 15  ALKPHOS 75  BILITOT 0.3  PROT 7.1  ALBUMIN 3.9   No results for input(s): LIPASE, AMYLASE in the last 168 hours. No results for input(s): AMMONIA in the last 168 hours. CBC: Recent Labs  Lab 02/06/18 0029 02/07/18 0623  WBC 11.2* 7.7  NEUTROABS 4.9  --   HGB 13.3 12.8  HCT 40.9 40.0  MCV 90.3 90.9  PLT 241 225   Cardiac Enzymes: Recent Labs  Lab 02/06/18 0029 02/06/18 0801 02/06/18 1347  TROPONINI <0.03 <0.03 <0.03   BNP: Invalid input(s): POCBNP CBG: Recent Labs  Lab 02/08/18 0843 02/08/18 1213 02/08/18 1642 02/08/18 2219 02/09/18 0758  GLUCAP 83 185* 177* 98 104*   D-Dimer No results for input(s): DDIMER in the last 72 hours. Hgb A1c No results for input(s): HGBA1C in the last 72 hours. Lipid Profile No results for input(s): CHOL, HDL, LDLCALC, TRIG, CHOLHDL, LDLDIRECT in the last 72 hours. Thyroid function studies No results for input(s): TSH, T4TOTAL, T3FREE, THYROIDAB in the last 72 hours.  Invalid input(s): FREET3 Anemia work up No results for input(s): VITAMINB12, FOLATE, FERRITIN, TIBC, IRON, RETICCTPCT in the last 72 hours. Urinalysis    Component Value Date/Time   COLORURINE YELLOW 11/02/2017 1557   APPEARANCEUR HAZY (A) 11/02/2017 1557   LABSPEC 1.025 12/20/2017 1241   PHURINE 6.0 12/20/2017 1241   GLUCOSEU NEGATIVE 12/20/2017 1241   HGBUR MODERATE (A) 12/20/2017 1241   BILIRUBINUR NEGATIVE 12/20/2017 1241   KETONESUR NEGATIVE 12/20/2017 1241   PROTEINUR NEGATIVE 12/20/2017  1241   UROBILINOGEN 0.2 12/20/2017 1241   NITRITE NEGATIVE 12/20/2017 1241   LEUKOCYTESUR NEGATIVE 12/20/2017 1241   Sepsis Labs Invalid input(s): PROCALCITONIN,  WBC,  LACTICIDVEN Microbiology Recent Results (from the past 240 hour(s))  MRSA PCR Screening     Status: None   Collection Time: 02/06/18  7:02 AM  Result Value Ref Range Status   MRSA by PCR NEGATIVE NEGATIVE Final    Comment:        The GeneXpert MRSA Assay (FDA approved for NASAL specimens only), is one component  of a comprehensive MRSA colonization surveillance program. It is not intended to diagnose MRSA infection nor to guide or monitor treatment for MRSA infections. Performed at [facility], [address], [place], [place] [address]      Time coordinating discharge: 35 minutes  SIGNED:   Paticia Stack, MD  [facility] 02/09/2018, 8:59 AM Pager   If 7PM-7AM, please contact night-coverage [email or web address] Password [email or web address]

## 2018-02-22 ENCOUNTER — Ambulatory Visit (HOSPITAL_COMMUNITY): Payer: Self-pay | Admitting: Licensed Clinical Social Worker

## 2018-03-08 ENCOUNTER — Ambulatory Visit (HOSPITAL_COMMUNITY): Payer: Self-pay | Admitting: Licensed Clinical Social Worker

## 2018-03-22 ENCOUNTER — Ambulatory Visit (HOSPITAL_COMMUNITY): Payer: Medicare Other

## 2018-03-22 ENCOUNTER — Ambulatory Visit (HOSPITAL_COMMUNITY)
Admission: RE | Admit: 2018-03-22 | Discharge: 2018-03-22 | Disposition: A | Discharge status: Home / Self Care | Payer: Medicare Other | Source: Ambulatory Visit | Attending: Cardiovascular Disease | Admitting: Cardiovascular Disease

## 2018-03-22 DIAGNOSIS — R0602 Shortness of breath: Secondary | ICD-10-CM | POA: Diagnosis not present

## 2018-03-22 DIAGNOSIS — I10 Essential (primary) hypertension: Secondary | ICD-10-CM | POA: Diagnosis not present

## 2018-03-22 DIAGNOSIS — R079 Chest pain, unspecified: Secondary | ICD-10-CM

## 2018-03-22 MED ORDER — NITROGLYCERIN 0.4 MG SL SUBL: 0.8000 mg | SUBLINGUAL_TABLET | Freq: Once | SUBLINGUAL | Status: DC

## 2018-03-22 MED ORDER — IOPAMIDOL (ISOVUE-370) INJECTION 76%
100.0000 mL | Freq: Once | INTRAVENOUS | Status: AC | PRN
  Administered 2018-03-22: 100 mL via INTRAVENOUS

## 2018-03-22 MED ORDER — METOPROLOL TARTRATE 5 MG/5ML IV SOLN
INTRAVENOUS | Status: AC
  Filled 2018-03-22: qty 20

## 2018-03-22 MED ORDER — METOPROLOL TARTRATE 5 MG/5ML IV SOLN
5.0000 mg | INTRAVENOUS | Status: DC | PRN
  Administered 2018-03-22: 5 mg via INTRAVENOUS

## 2018-03-22 NOTE — Progress Notes (Signed)
Cardiology Office Note    Date:  03/27/2018   ID:  Katrina [name], DOB August 01, 1960, MRN [id number]  PCP:  [name], FNP  Cardiologist: Kate Sable, MD EPS: None  No chief complaint on file.   History of Present Illness:  Katrina [name] is a 57 y.o. female with history of COPD, tobacco abuse, CVA, PTSD, who had 8 hypertensive emergencies in July 2019 secondary to noncompliance with medication and refusal to follow low-sodium diet.  Troponins were normal.  Head CT was suggestive of hypertensive encephalopathy otherwise chronic small vessel ischemic disease.  2D echo 05/2017 vigorous LV systolic function EF 65 to 70% with grade 1 DD and high ventricular filling pressures mild concentric and mild to moderate basal septal hypertrophy.  She was then hospitalized in [place] 01/05/2018 and was told she might have a TIA.  She was having chest pain.    Patient saw Dr. Bronson Ing 01/30/2018 and blood pressure was 190/118.  She claims to be compliant with medications.  He increased her hydralazine to 100 mg 3 times daily and added Imdur 30 mg daily.  She was on Plavix for CVA.  She was having chest pain.  She has history of normal coronary arteries 01/2015.  He ordered a coronary CTA.  Coronary CTA showed calcium score of 3.7 placing her in the 74th percentile suggesting intermediate risk for future cardiac events.  No obstructive coronary disease.  Is once again admitted with hypertensive urgency secondary to noncompliance.  She was discharged 02/09/2018.  CT and MRI of the brain were negative.  She had a right external carotid stenosis greater than 70% which is not clinically significant given she has compensatory blood flow from bilateral internal carotid arteries and left external carotid artery basal/vertebral arteries.  Psychiatry was consulted due to her non-cooperative behavior and she was felt to be at high risk for readmission because of adjustment disorder with emotional disturbance and  medical noncompliance.  Blood pressure at discharge was 149/134.  Patient is a atenolol, chlorthalidone, Imdur and losartan were stopped and she was placed on clonidine 0.2 mg patch twice daily.  Patient comes in today accompanied by her daughter.  Blood pressure is 180/110 which she says is good for her.  She never got the clonidine patch filled because she did not want to.  Only taking amlodipine and hydralazine.    Past Medical History:  Diagnosis Date  . Allergy   . Anemia 1975-1976   . Anxiety    takes Valium daily as needed  . Arthritis    "spine" (12/03/2016)  . Asthma    has inhalers but doesn't use (12/03/2016)  . Chronic bronchitis ([place])    "get it a couple times q yr" (12/03/2016)  . Chronic kidney disease   . Chronic lower back pain    budlging disc   . Claustrophobia   . COPD (chronic obstructive pulmonary disease) ([place])   . Daily headache   . Depression   . Diverticulitis   . Family history of adverse reaction to anesthesia    2 daughters gets extremely sick   . GERD (gastroesophageal reflux disease)    takes Dexilant daily  . Heart murmur   . History of blood transfusion 1975-1976 "several"   no abnormal reaction noted  . History of colitis   . History of colon polyps    benign  . History of gastric ulcer   . History of hiatal hernia    "small one"  . History of  MRSA infection 2017  . Hyperlipidemia    was on meds but has been off over a yr  . Hypertension    takes Amlodipine and Maxzide daily  . Insomnia    takes Ambien nightly  . Iron deficiency anemia    "when I was young"  . Lung nodules   . Migraine    "2-3/wk" (12/03/2016)  . MS (multiple sclerosis) ([place])    questionable per pt  . Noncompliance   . Osteoporosis   . Peripheral neuropathy    weakness,numbness,and tingling. Takes Gabapentin daily  . PTSD (post-traumatic stress disorder) dx'd 2016/2017   "was dx'd w/bipolar in 1991; replaced w/PTSD dx 2016/2017" (12/03/2016)  . Restless leg  syndrome    takes Requip daily  . 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 Type II diabetes mellitus ([place])    "went off insulin in 2012/2013" (12/03/2016)    Past Surgical History:  Procedure Laterality Date  . ABDOMINAL AORTOGRAM N/A 12/03/2016   Procedure: Abdominal Aortogram;  Surgeon: Angelia Mould, MD;  Location: [facility];  Service: Cardiovascular;  Laterality: N/A;  . ABDOMINAL HYSTERECTOMY  12/1987  . ADENOIDECTOMY  1975  . ANKLE SURGERY Bilateral 1993; 1995 X2   "stabilzation done; 1 on the right; 2 on the left"  . APPENDECTOMY  1989  . BREAST EXCISIONAL BIOPSY Left   . BREAST LUMPECTOMY Left    "benign tumor"  . CARDIAC CATHETERIZATION N/A 01/31/2015   Procedure: Left Heart Cath and Coronary Angiography;  Surgeon: Burnell Blanks, MD;  Location: [facility];  Service: Cardiovascular;  Laterality: N/A;  . COLONOSCOPY    . DILATION AND CURETTAGE OF UTERUS    . ESOPHAGOGASTRODUODENOSCOPY    . LAPAROSCOPIC CHOLECYSTECTOMY  1996  . NASAL RECONSTRUCTION  1976  . NASAL SINUS SURGERY  1975  . POSTERIOR LUMBAR FUSION  2005  . RADIOLOGY WITH ANESTHESIA N/A 01/15/2016   Procedure: MRI LUMBAR SPINE WITHOUT;  Surgeon: Medication Radiologist, MD;  Location: [facility];  Service: Radiology;  Laterality: N/A;  . RADIOLOGY WITH ANESTHESIA N/A 06/29/2016   Procedure: MRI OF THE BRAIN WITH AND WITHOUT;  Surgeon: Medication Radiologist, MD;  Location: [facility];  Service: Radiology;  Laterality: N/A;  . RENAL ANGIOGRAPHY N/A 12/03/2016   Procedure: Renal Angiography;  Surgeon: Angelia Mould, MD;  Location: [facility];  Service: Cardiovascular;  Laterality: N/A;  . TONSILLECTOMY  1992  . TUBAL LIGATION    . TUMOR EXCISION  1998   from back of skull; developed;  MRSA from the area that had to be packed    Current Medications: Current Meds  Medication Sig  . amLODipine  (NORVASC) 10 MG tablet Take 1 tablet (10 mg total) by mouth daily.  Marland Kitchen aspirin-acetaminophen-caffeine (EXCEDRIN EXTRA STRENGTH) 250-250-65 MG tablet Take 2 tablets by mouth daily as needed for headache or migraine.   . cloNIDine (CATAPRES - DOSED IN MG/24 HR) 0.2 mg/24hr patch Place 0.2 mg onto the skin once a week.  . clopidogrel (PLAVIX) 75 MG tablet Take 75 mg by mouth daily.   Marland Kitchen dexlansoprazole (DEXILANT) 60 MG capsule Take 1 tab by mouth every morning. (Patient taking differently: Take 60 mg by mouth every evening. )  . EPINEPHrine (EPIPEN 2-PAK) 0.3 mg/0.3 mL IJ SOAJ injection Inject into the muscle.  . gabapentin (NEURONTIN) 600 MG tablet Take 600 mg by mouth 4 (four) times  daily.  . hydrALAZINE (APRESOLINE) 100 MG tablet Take 1 tablet (100 mg total) by mouth 3 (three) times daily.  Marland Kitchen rOPINIRole (REQUIP) 4 MG tablet Take 4 mg by mouth at bedtime.  Marland Kitchen zolpidem (AMBIEN CR) 12.5 MG CR tablet Take 12.5 mg by mouth at bedtime.     Allergies:   Other; Penicillins; Zithromax [azithromycin]; Aspirin; Pineapple; Strawberry extract; Aspartame and phenylalanine; Mushroom extract complex; and Nicardipine   Social History   Socioeconomic History  . Marital status: Divorced    Spouse name: Not on file  . Number of children: 3  . Years of education: Some college  . Highest education level: Not on file  Occupational History    Comment: Disability for back pain  Social Needs  . Financial resource strain: Not on file  . Food insecurity:    Worry: Not on file    Inability: Not on file  . Transportation needs:    Medical: Not on file    Non-medical: Not on file  Tobacco Use  . Smoking status: Current Every Day Smoker    Packs/day: 1.00    Years: 45.00    Pack years: 45.00    Types: Cigarettes    Start date: 03/04/1971  . Smokeless tobacco: Never Used  Substance and Sexual Activity  . Alcohol use: No    Alcohol/week: 0.0 standard drinks  . Drug use: No  . Sexual activity: Never    Birth  control/protection: Surgical  Lifestyle  . Physical activity:    Days per week: Not on file    Minutes per session: Not on file  . Stress: Not on file  Relationships  . Social connections:    Talks on phone: Not on file    Gets together: Not on file    Attends religious service: Not on file    Active member of club or organization: Not on file    Attends meetings of clubs or organizations: Not on file    Relationship status: Not on file  Other Topics Concern  . Not on file  Social History Narrative   Lives at home with her daughter.   Right-handed.   1-2 cups coffee in the morning and 2 sodas per day.     Family History:  The patient's family history includes Allergic rhinitis in her daughter and father; Asthma in her daughter; Bipolar disorder in her brother and daughter; Cancer in her father; Coronary artery disease in her father; Depression in her mother; Diabetes in her maternal grandmother; Drug abuse in her brother; Emphysema in her father; Heart attack in her brother and sister; Heart attack (age of onset: 31) in her father; Leukemia in her brother; Liver disease in her cousin; Skin cancer in her mother; Stomach cancer in her maternal grandfather and other; Stroke in her father; Urticaria in her daughter.   ROS:   Please see the history of present illness.    ROS All other systems reviewed and are negative.   PHYSICAL EXAM:   VS:  BP (!) 180/110   Pulse 81   Ht 5\' 7"  (1.702 m)   Wt 220 lb 9.6 oz (100.1 kg)   SpO2 97%   BMI 34.55 kg/m   Physical Exam  GEN: Well nourished, well developed, in no acute distress  Neck: no JVD, carotid bruits, or masses Cardiac:RRR; positive S4 no murmurs, rubs   Respiratory:  clear to auscultation bilaterally, normal work of breathing GI: soft, nontender, nondistended, + BS Ext: without cyanosis, clubbing, or  edema, Good distal pulses bilaterally Neuro:  Alert and Oriented x 3 Psych: euthymic mood, full affect  Wt Readings from Last 3  Encounters:  03/27/18 220 lb 9.6 oz (100.1 kg)  02/07/18 219 lb 2.2 oz (99.4 kg)  01/30/18 215 lb (97.5 kg)      Studies/Labs Reviewed:   EKG:  EKG is not ordered today.   Recent Labs: 08/21/2017: TSH 2.522 02/06/2018: ALT 15; B Natriuretic Peptide 30.0; Magnesium 2.2 02/07/2018: BUN 15; Creatinine, Ser 0.85; Hemoglobin 12.8; Platelets 225; Potassium 3.8; Sodium 142   Lipid Panel    Component Value Date/Time   CHOL 140 08/21/2017 0525   TRIG 142 08/21/2017 0525   HDL 30 (L) 08/21/2017 0525   CHOLHDL 4.7 08/21/2017 0525   VLDL 28 08/21/2017 0525   LDLCALC 82 08/21/2017 0525    Additional studies/ records that were reviewed today include:  2D echo 05/17/2017  study Conclusions   - Left ventricle: The cavity size was normal. Systolic function was   vigorous. The estimated ejection fraction was in the range of 65%   to 70%. Wall motion was normal; there were no regional wall   motion abnormalities. Doppler parameters are consistent with   abnormal left ventricular relaxation (grade 1 diastolic   dysfunction). Doppler parameters are consistent with high   ventricular filling pressure. Mild concentric and mild to   moderate focal basal septal hypertrophy   Coronary CTA 10/19/2019IMPRESSION: 1. Coronary artery calcium score 3.7 Agatston units, placing the patient in the 74th percentile for age and gender, suggesting intermediate risk for future cardiac events.   2.  No obstructive coronary disease.   Dalton Mclean   ASSESSMENT:    1. Malignant hypertension   2. Cerebrovascular accident (CVA), unspecified mechanism ([place])   3. Non compliance with medical treatment   4. Tobacco abuse   5. Chest pain in adult      PLAN:  In order of problems listed above:  Malignant hypertension with recurrent hypertensive emergencies secondary to noncompliance.  Atenolol, chlorthalidone, Imdur, and losartan were all stopped in the hospital to try to simplify her medications.  Was  discharged on clonidine patch but never got it filled.  I gave her the option of restarting her oral medications versus the patch and she says she will fill the patch 0.2 mg twice daily.  Come back for follow-up with Dr. Bronson Ing for further titration of medications.  Long discussion about the importance of getting her blood pressure under control.  Her daughter does all the cooking and follows a low-sodium diet.  History of CVAs on Plavix  History of chest pain normal coronary arteries on cardiac cath in 2016.  Coronary CTA ordered by Dr. Bronson Ing and performed 03/22/2018.  Calcium score 3.7 which puts her at intermediate risk for future cardiac events and normal coronary arteries without correction  Medication Adjustments/Labs and Tests Ordered: Current medicines are reviewed at length with the patient today.  Concerns regarding medicines are outlined above.  Medication changes, Labs and Tests ordered today are listed in the Patient Instructions below. Patient Instructions  Medication Instructions:  Your physician recommends that you continue on your current medications as directed. Please refer to the Current Medication list given to you today.  Restart Clonidine Patch   If you need a refill on your cardiac medications before your next appointment, please call your pharmacy.   Lab work: NONE  If you have labs (blood work) drawn today and your tests are completely normal, you will  receive your results only by: Marland Kitchen MyChart Message (if you have MyChart) OR . A paper copy in the mail If you have any lab test that is abnormal or we need to change your treatment, we will call you to review the results.  Testing/Procedures: NONE   Follow-Up: At [facility], you and your health needs are our priority.  As part of our continuing mission to provide you with exceptional heart care, we have created designated Provider Care Teams.  These Care Teams include your primary Cardiologist (physician)  and Advanced Practice Providers (APPs -  Physician Assistants and Nurse Practitioners) who all work together to provide you with the care you need, when you need it. You will need a follow up appointment next available.  Please call our office 2 months in advance to schedule this appointment.  You may see Kate Sable, MD or one of the following Advanced Practice Providers on your designated Care Team:   Bernerd Pho, PA-C [facility]) . Ermalinda Barrios, PA-C ([place])  Any Other Special Instructions Will Be Listed Below (If Applicable). Thank you for choosing [facility]!        Sumner Boast, PA-C  03/27/2018 11:27 AM    [facility] [place], [place], [place]  [address] Phone: [phone]; Fax: [phone]

## 2018-03-27 ENCOUNTER — Encounter: Payer: Self-pay | Admitting: Physician Assistant

## 2018-03-27 ENCOUNTER — Ambulatory Visit (INDEPENDENT_AMBULATORY_CARE_PROVIDER_SITE_OTHER): Payer: Medicare Other | Admitting: Physician Assistant

## 2018-03-27 VITALS — BP 180/110 | HR 81 | Ht 67.0 in | Wt 220.6 lb

## 2018-03-27 DIAGNOSIS — Z720 Tobacco use: Secondary | ICD-10-CM

## 2018-03-27 DIAGNOSIS — I639 Cerebral infarction, unspecified: Secondary | ICD-10-CM

## 2018-03-27 DIAGNOSIS — Z9119 Patient's noncompliance with other medical treatment and regimen: Secondary | ICD-10-CM

## 2018-03-27 DIAGNOSIS — R079 Chest pain, unspecified: Secondary | ICD-10-CM

## 2018-03-27 DIAGNOSIS — I10 Essential (primary) hypertension: Secondary | ICD-10-CM

## 2018-03-27 DIAGNOSIS — Z91199 Patient's noncompliance with other medical treatment and regimen due to unspecified reason: Secondary | ICD-10-CM

## 2018-03-27 NOTE — Patient Instructions (Signed)
Medication Instructions:  Your physician recommends that you continue on your current medications as directed. Please refer to the Current Medication list given to you today.  Restart Clonidine Patch   If you need a refill on your cardiac medications before your next appointment, please call your pharmacy.   Lab work: NONE  If you have labs (blood work) drawn today and your tests are completely normal, you will receive your results only by: Marland Kitchen MyChart Message (if you have MyChart) OR . A paper copy in the mail If you have any lab test that is abnormal or we need to change your treatment, we will call you to review the results.  Testing/Procedures: NONE   Follow-Up: At [facility], you and your health needs are our priority.  As part of our continuing mission to provide you with exceptional heart care, we have created designated Provider Care Teams.  These Care Teams include your primary Cardiologist (physician) and Advanced Practice Providers (APPs -  Physician Assistants and Nurse Practitioners) who all work together to provide you with the care you need, when you need it. You will need a follow up appointment next available.  Please call our office 2 months in advance to schedule this appointment.  You may see Kate Sable, MD or one of the following Advanced Practice Providers on your designated Care Team:   Bernerd Pho, PA-C [facility]) . Ermalinda Barrios, PA-C ([place])  Any Other Special Instructions Will Be Listed Below (If Applicable). Thank you for choosing [facility]!

## 2018-03-31 ENCOUNTER — Telehealth: Payer: Self-pay | Admitting: *Deleted

## 2018-03-31 ENCOUNTER — Telehealth: Payer: Self-pay | Admitting: Cardiovascular Disease

## 2018-03-31 NOTE — Telephone Encounter (Signed)
Patient called stating that she is confused about her medications.  Please call [phone].

## 2018-03-31 NOTE — Telephone Encounter (Signed)
Notes recorded by Laurine Blazer, LPN on 82/11/154 at 2:29 PM EDT Patient notified. Copy to pmd. Follow up scheduled for 06/19/2018 with Dr. Marcos Eke in [place] office.   ------  Notes recorded by Herminio Commons, MD on 03/23/2018 at 9:27 AM EDT This study demonstrates: No significant blockages. Medication changes / Follow up studies / Other recommendations:  Antihypertensive therapy medication compliance. Please send results to the PCP: [name], FNP   Kate Sable, MD 03/23/2018 9:25 AM   ------  Notes recorded by Herminio Commons, MD on 03/22/2018 at 4:09 PM EDT Await coronary results. No acute or significant extracardiac abnormality.

## 2018-03-31 NOTE — Telephone Encounter (Signed)
Pt was seen by pcp and is confused regarding medication currently on taking norvasc 10 mg daily,  hydralazine 100mg  tid, plavix 75 mg daily - has not started clonidine patches yet - pcp suggested she also start chlorthalidone 50 mg daily but pt hasn't started this yet - BP yesterday at pcp was 200/106 (says this is lower than usual for her) wants to know what Dr Bronson Ing wants her to take - routed to provider

## 2018-04-03 IMAGING — US US THYROID
1 series · 13 of 25 positions shown · non-contrast
Comparison: Prior CT scan of the chest 08/03/2016

CLINICAL DATA: Incidental on CT. 56-year-old female with a thyroid
nodule seen on prior CT scan

EXAM:
THYROID ULTRASOUND
TECHNIQUE: Ultrasound examination of the thyroid gland and adjacent soft
tissues was performed.

[Series 1: us thyroid · 0.05mm/px · 41 acquisitions, 13 frames shown]
[im 1/41]
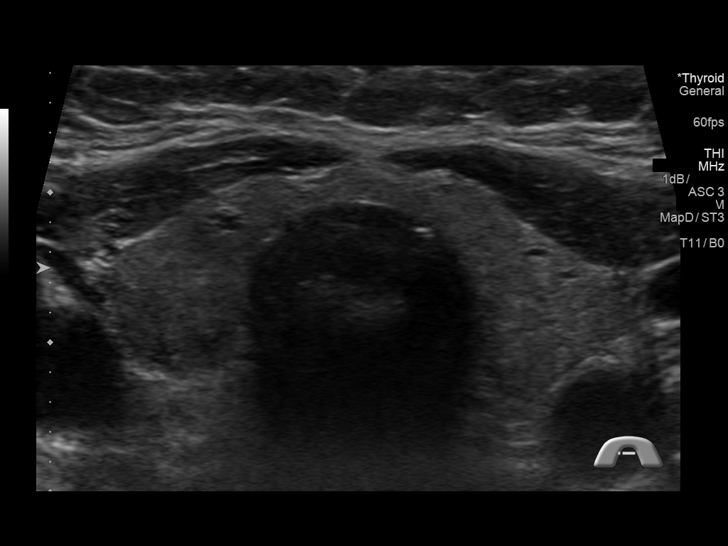
[im 4/41]
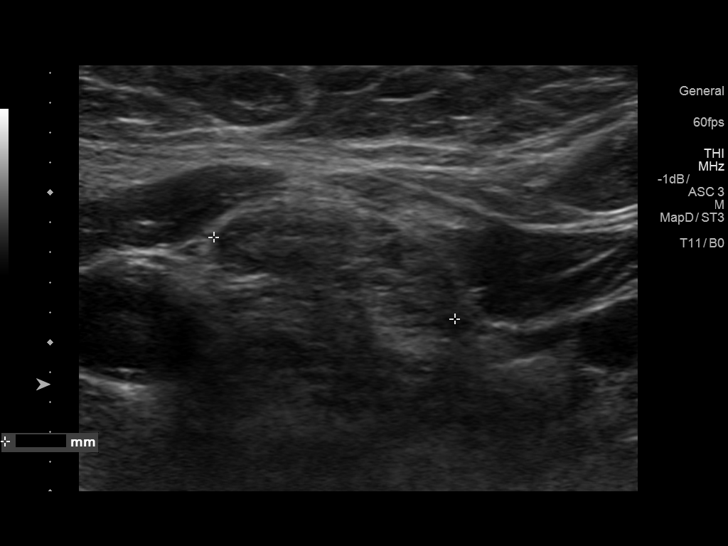
[im 7/41]
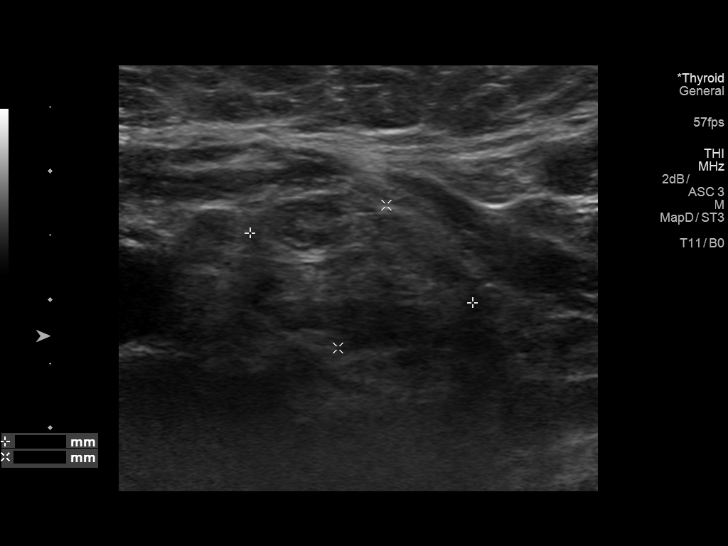
[im 11/41]
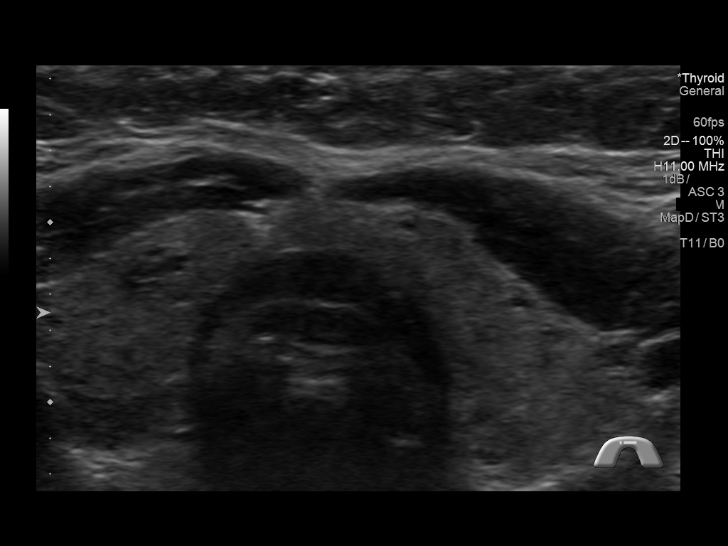
[im 14/41]
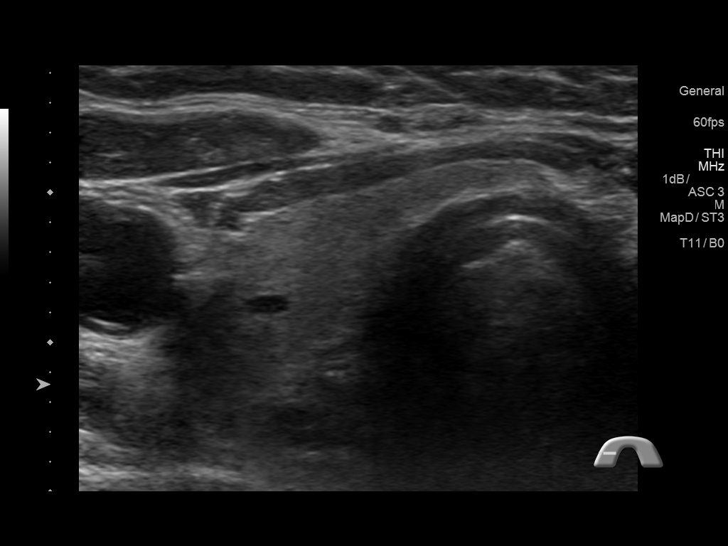
[im 17/41]
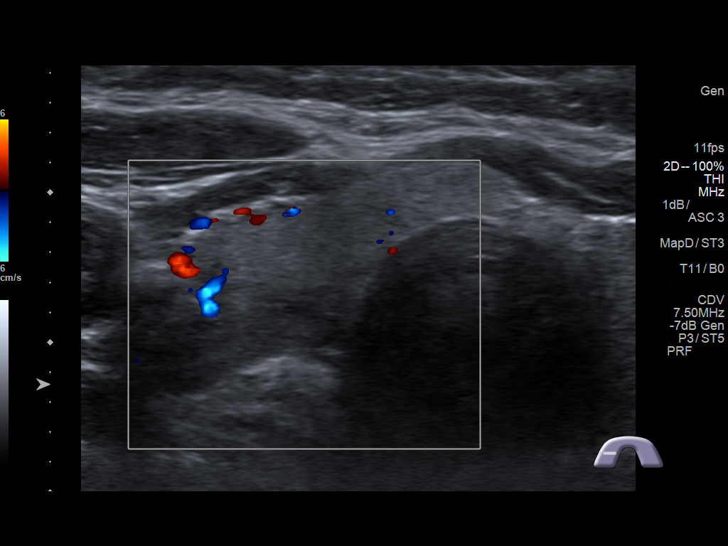
[im 21/41]
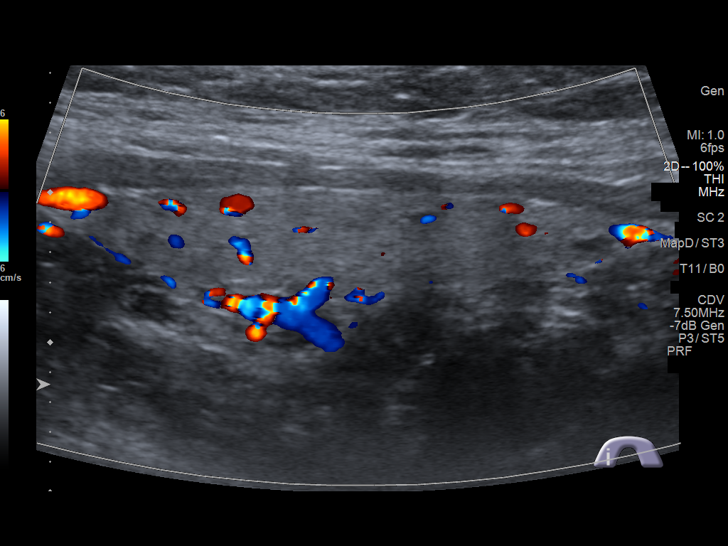
[im 24/41]
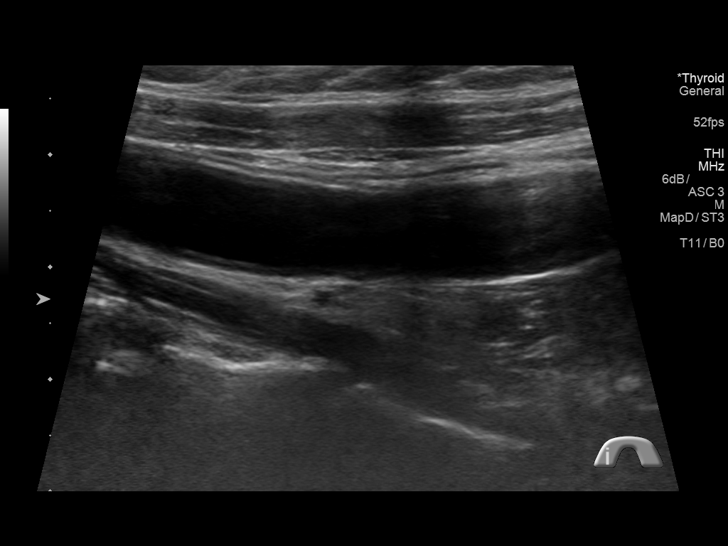
[im 27/41]
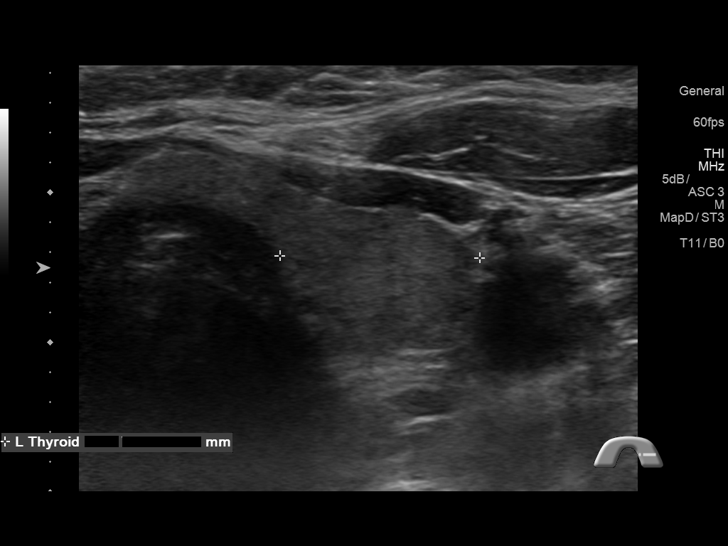
[im 31/41]
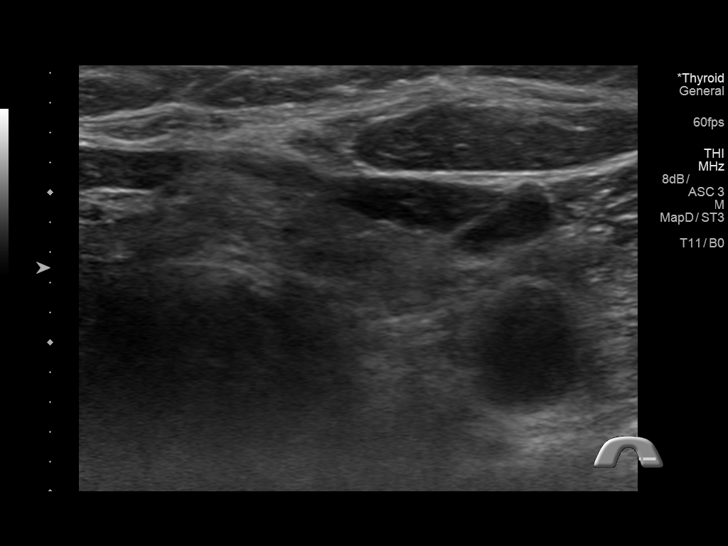
[im 34/41]
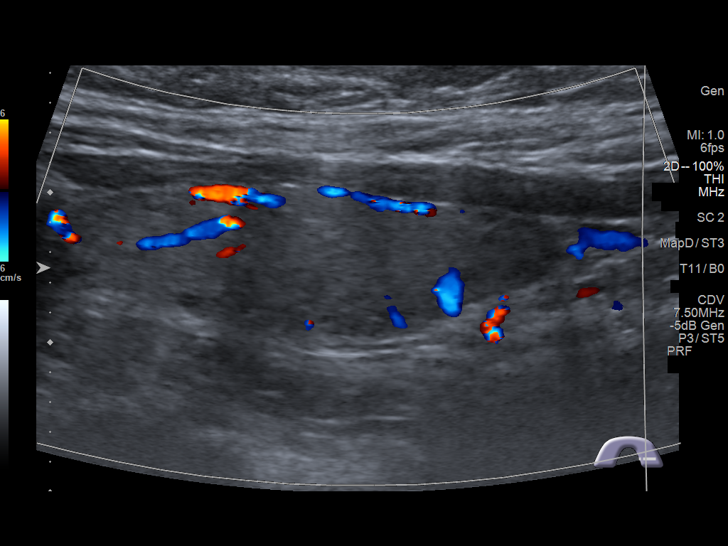
[im 37/41]
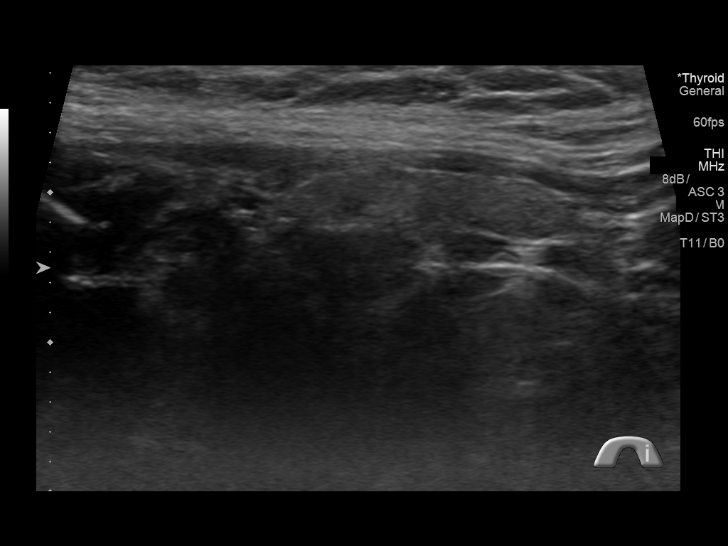
[im 41/41]
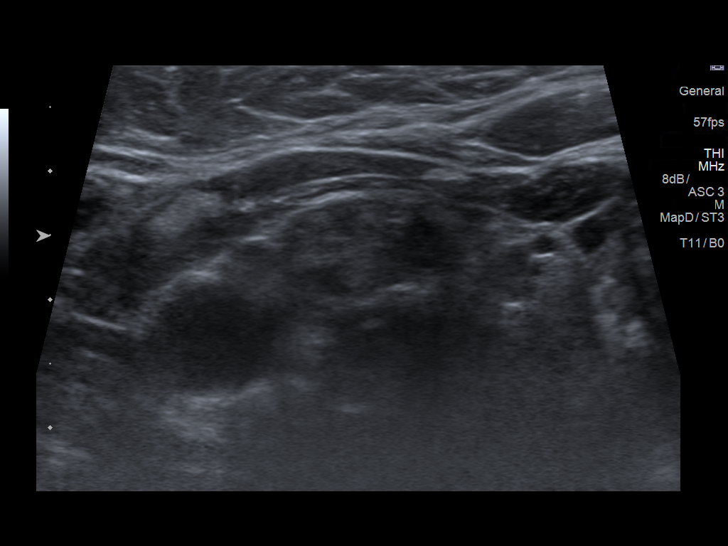

[13 of 25 positions shown; findings below may reference images not displayed]

FINDINGS: Parenchymal Echotexture: Mildly heterogenous

Isthmus: 0.3 cm

Right lobe: 4.1 x 1.1 x 1.2 cm

Left lobe: 4.2 x 1.1 x 1.3 cm

_________________________________________________________

Estimated total number of nodules >/= 1 cm: 1

Number of spongiform nodules >/=  2 cm not described below (TR1): 0

Number of mixed cystic and solid nodules >/= 1.5 cm not described
below (TR2): 0

_________________________________________________________

Nodule # 1:

Location: Isthmus; Inferior

Maximum size: 1.8 cm; Other 2 dimensions: 1.7 x 1.2 cm

Composition: solid/almost completely solid (2)

Echogenicity: isoechoic (1)

Shape: not taller-than-wide (0)

Margins: ill-defined (0)

Echogenic foci: none (0)

ACR TI-RADS total points: 3.

ACR TI-RADS risk category: TR3 (3 points).

ACR TI-RADS recommendations:

*Given size (>/= 1.5 - 2.4 cm) and appearance, a follow-up
ultrasound in 1 year should be considered based on TI-RADS criteria.

_________________________________________________________
IMPRESSION: A 1.8 cm TI-RADS category 3 nodule is noted exophytic from the
inferior aspect of the thyroid isthmus. This nodule meets criteria
for follow-up ultrasound in 1 year.

The above is in keeping with the ACR TI-RADS recommendations - [HOSPITAL] 6723;[DATE].

## 2018-04-03 IMAGING — US US EXTREM LOW VENOUS*R*
1 series · 13 of 24 positions shown · non-contrast
Comparison: 05/14/2016

CLINICAL DATA: 56-year-old female with several weeks of right calf
pain and swelling with erythema.



[Series 1: us extrem low venous*right* · 0.08mm/px · 13 of 46 slices shown]
[im 1/46]
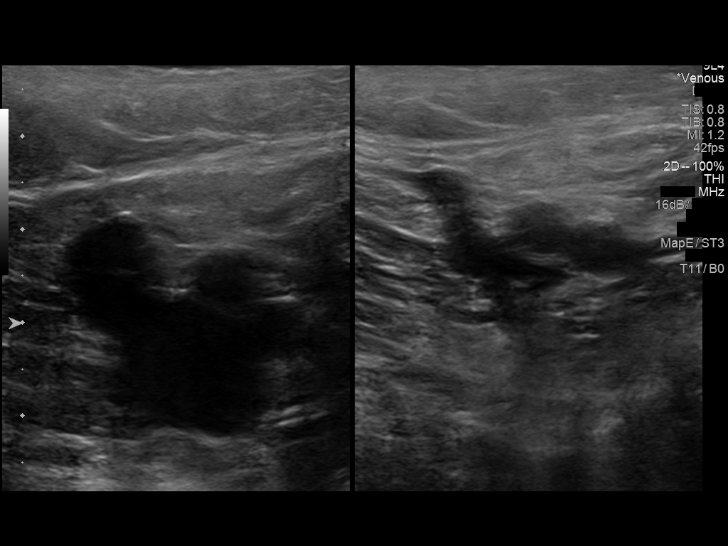
[im 4/46]
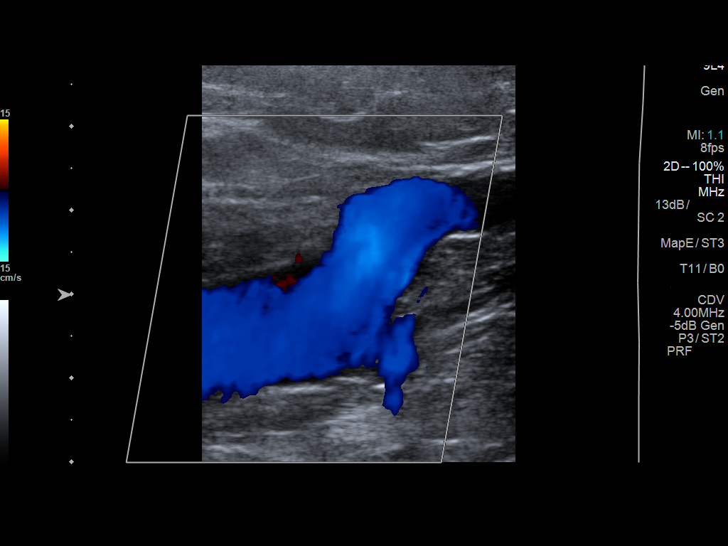
[im 8/46]
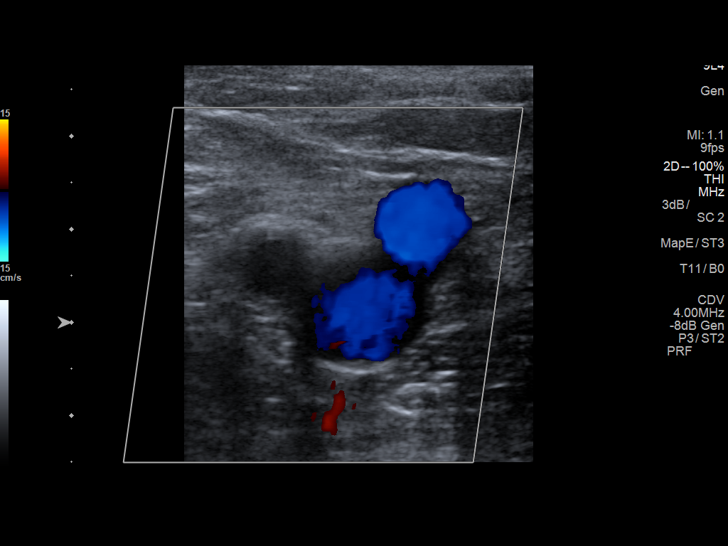
[im 12/46]
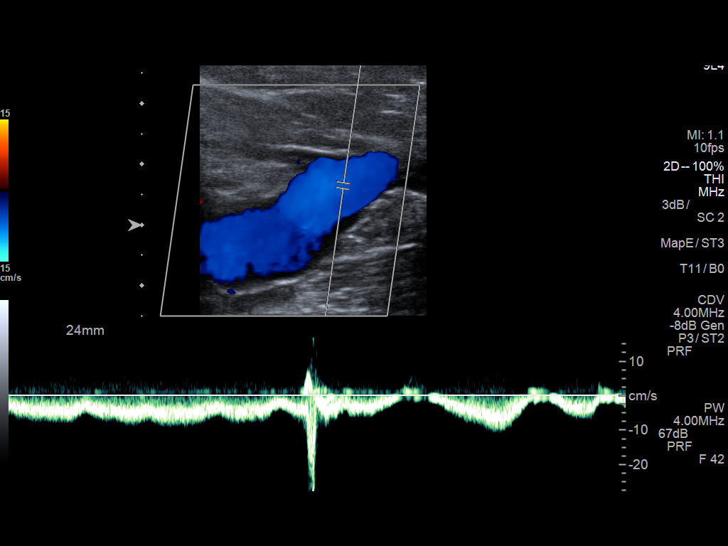
[im 16/46]
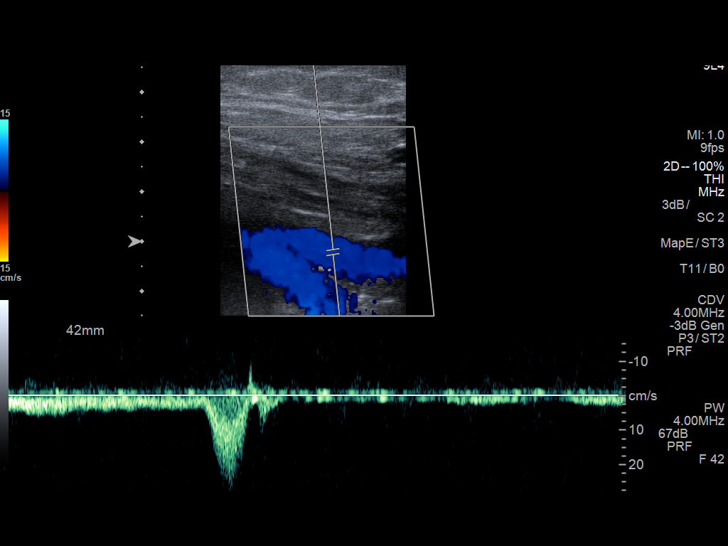
[im 20/46]
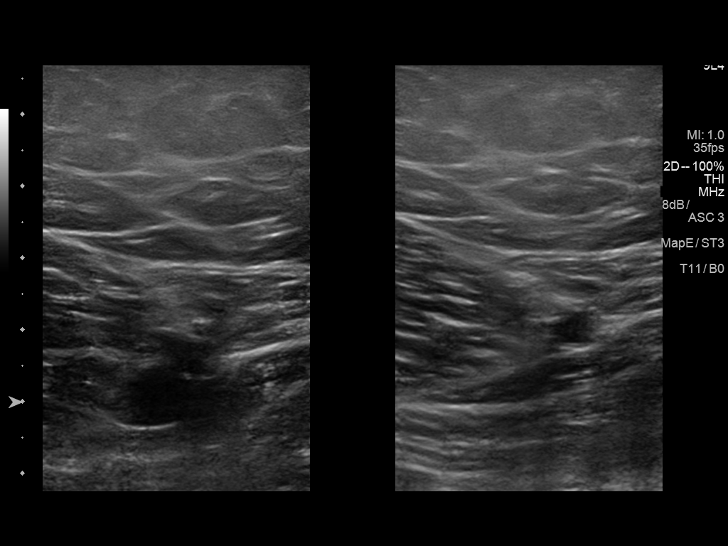
[im 24/46]
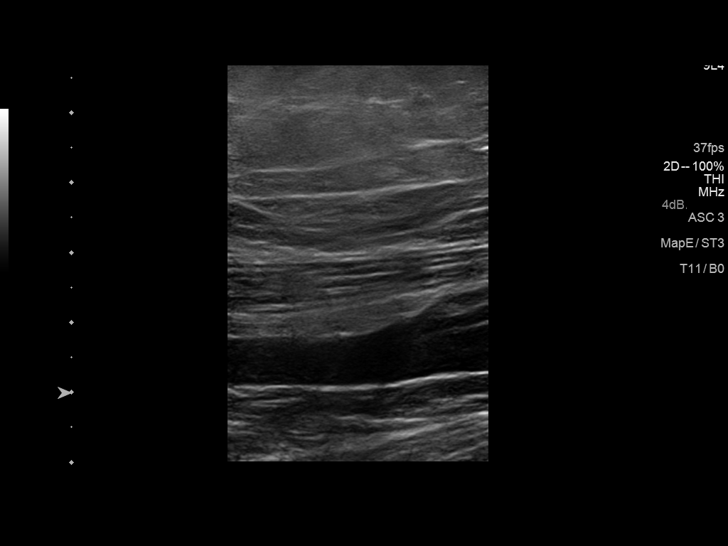
[im 26/46]
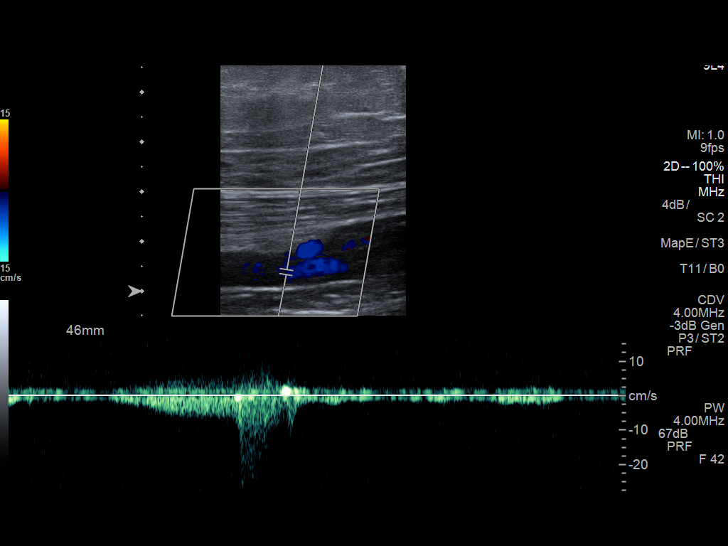
[im 30/46]
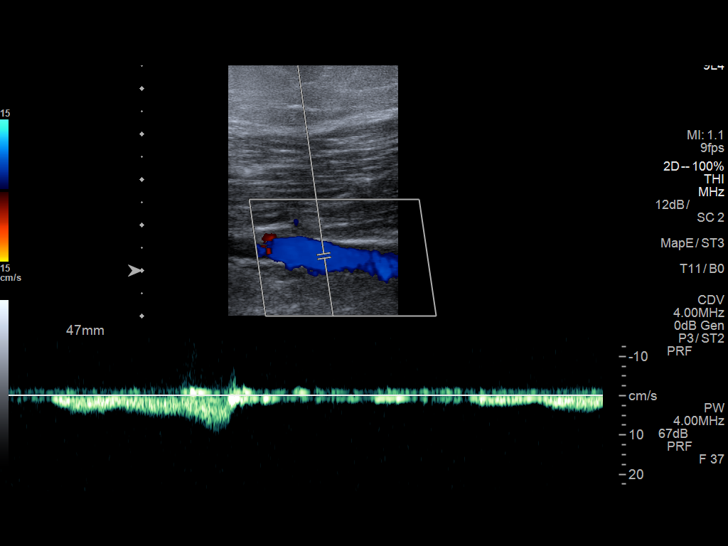
[im 34/46]
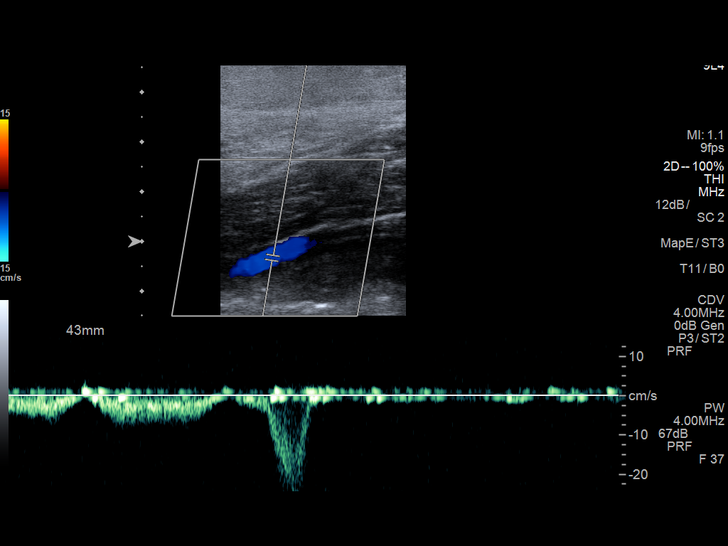
[im 38/46]
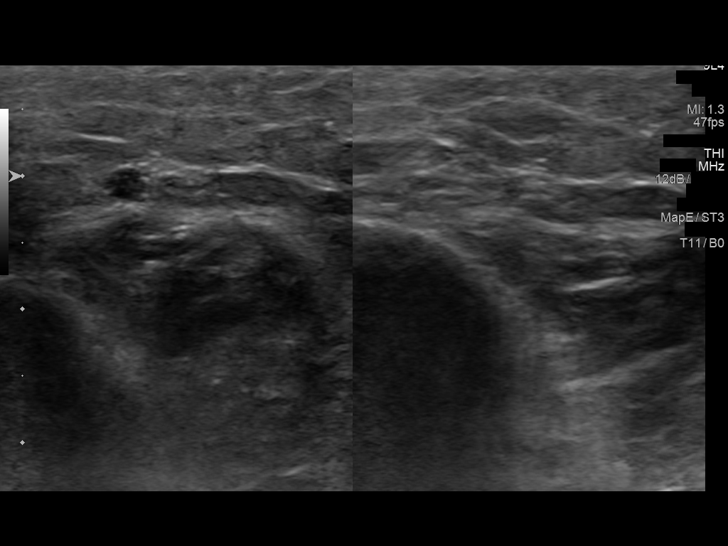
[im 42/46]
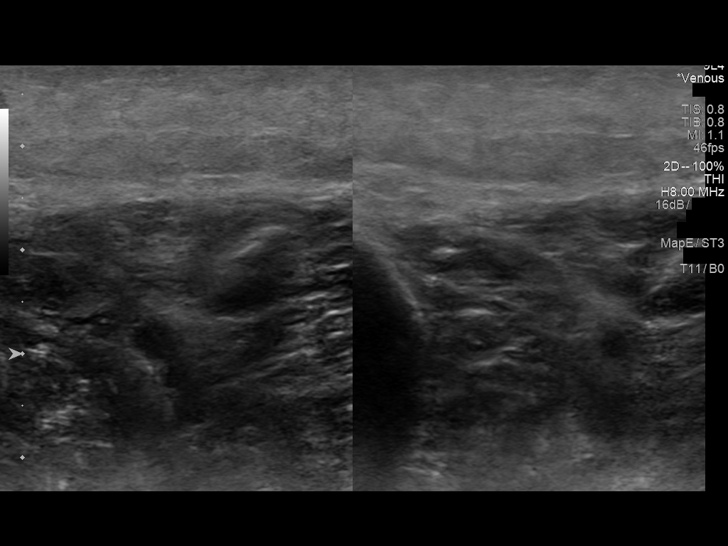
[im 46/46]
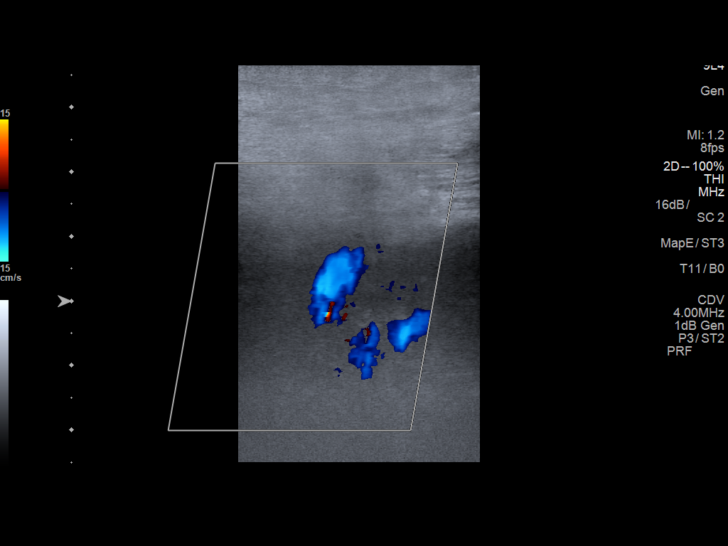

[13 of 24 positions shown; findings below may reference images not displayed]

FINDINGS: Contralateral Common Femoral Vein: Respiratory phasicity is normal
and symmetric with the symptomatic side. No evidence of thrombus.
Normal compressibility.

Common Femoral Vein: No evidence of thrombus. Normal
compressibility, respiratory phasicity and response to augmentation.

Saphenofemoral Junction: No evidence of thrombus. Normal
compressibility and flow on color Doppler imaging.

Profunda Femoral Vein: No evidence of thrombus. Normal
compressibility and flow on color Doppler imaging.

Femoral Vein: No evidence of thrombus. Normal compressibility,
respiratory phasicity and response to augmentation.

Popliteal Vein: No evidence of thrombus. Normal compressibility,
respiratory phasicity and response to augmentation.

Calf Veins: No evidence of thrombus. Normal compressibility and flow
on color Doppler imaging.

Superficial Great Saphenous Vein: No evidence of thrombus. Normal
compressibility and flow on color Doppler imaging.

Venous Reflux:  None.

Other Findings:  None.
IMPRESSION: No evidence of right lower extremity deep venous thrombosis.

## 2018-04-04 ENCOUNTER — Telehealth: Payer: Self-pay | Admitting: Cardiovascular Disease

## 2018-04-04 DIAGNOSIS — N189 Chronic kidney disease, unspecified: Secondary | ICD-10-CM | POA: Insufficient documentation

## 2018-04-04 DIAGNOSIS — Z7982 Long term (current) use of aspirin: Secondary | ICD-10-CM | POA: Insufficient documentation

## 2018-04-04 DIAGNOSIS — E1122 Type 2 diabetes mellitus with diabetic chronic kidney disease: Secondary | ICD-10-CM | POA: Insufficient documentation

## 2018-04-04 DIAGNOSIS — W108XXA Fall (on) (from) other stairs and steps, initial encounter: Secondary | ICD-10-CM | POA: Diagnosis not present

## 2018-04-04 DIAGNOSIS — E1140 Type 2 diabetes mellitus with diabetic neuropathy, unspecified: Secondary | ICD-10-CM | POA: Diagnosis not present

## 2018-04-04 DIAGNOSIS — Y92039 Unspecified place in apartment as the place of occurrence of the external cause: Secondary | ICD-10-CM | POA: Insufficient documentation

## 2018-04-04 DIAGNOSIS — M542 Cervicalgia: Secondary | ICD-10-CM | POA: Insufficient documentation

## 2018-04-04 DIAGNOSIS — Y939 Activity, unspecified: Secondary | ICD-10-CM | POA: Diagnosis not present

## 2018-04-04 DIAGNOSIS — Z79899 Other long term (current) drug therapy: Secondary | ICD-10-CM | POA: Insufficient documentation

## 2018-04-04 DIAGNOSIS — F17210 Nicotine dependence, cigarettes, uncomplicated: Secondary | ICD-10-CM | POA: Diagnosis not present

## 2018-04-04 DIAGNOSIS — G43909 Migraine, unspecified, not intractable, without status migrainosus: Secondary | ICD-10-CM | POA: Diagnosis present

## 2018-04-04 DIAGNOSIS — J449 Chronic obstructive pulmonary disease, unspecified: Secondary | ICD-10-CM | POA: Diagnosis not present

## 2018-04-04 DIAGNOSIS — S0003XA Contusion of scalp, initial encounter: Secondary | ICD-10-CM | POA: Insufficient documentation

## 2018-04-04 DIAGNOSIS — Y999 Unspecified external cause status: Secondary | ICD-10-CM | POA: Diagnosis not present

## 2018-04-04 DIAGNOSIS — I129 Hypertensive chronic kidney disease with stage 1 through stage 4 chronic kidney disease, or unspecified chronic kidney disease: Secondary | ICD-10-CM | POA: Diagnosis not present

## 2018-04-04 NOTE — Telephone Encounter (Signed)
Spoke with Marjorie Smolder with [facility]- she states that she has been in contact with the pt and she is confused about her medications. Her blood pressure is elevated and she is having headaches. Marjorie Smolder is questioning the medications the pt is to be on for her blood pressure. She is only taking hydralazine 100 mg tid currently and nothing else. Between PCP, HOSPITAL and cardiology (PA) PT IS confused. What cardiac meds should pt be taking. Please advise.

## 2018-04-04 NOTE — Telephone Encounter (Signed)
Returned Angelina's call. No answer. Left message for her to return call.

## 2018-04-04 NOTE — Telephone Encounter (Signed)
Patient's HHN has questions regarding new BP medications. / tg

## 2018-04-05 ENCOUNTER — Encounter (HOSPITAL_COMMUNITY): Payer: Self-pay | Admitting: Emergency Medicine

## 2018-04-05 ENCOUNTER — Other Ambulatory Visit: Payer: Self-pay

## 2018-04-05 ENCOUNTER — Emergency Department (HOSPITAL_COMMUNITY): Payer: Medicare Other

## 2018-04-05 ENCOUNTER — Emergency Department (HOSPITAL_COMMUNITY)
Admission: EM | Admit: 2018-04-05 | Discharge: 2018-04-05 | Disposition: A | Discharge status: Home / Self Care | Payer: Medicare Other | Attending: Emergency Medicine | Admitting: Emergency Medicine

## 2018-04-05 DIAGNOSIS — S0003XA Contusion of scalp, initial encounter: Secondary | ICD-10-CM

## 2018-04-05 DIAGNOSIS — G43909 Migraine, unspecified, not intractable, without status migrainosus: Secondary | ICD-10-CM | POA: Diagnosis not present

## 2018-04-05 DIAGNOSIS — G43009 Migraine without aura, not intractable, without status migrainosus: Secondary | ICD-10-CM

## 2018-04-05 DIAGNOSIS — M542 Cervicalgia: Secondary | ICD-10-CM

## 2018-04-05 DIAGNOSIS — W108XXA Fall (on) (from) other stairs and steps, initial encounter: Secondary | ICD-10-CM

## 2018-04-05 MED ORDER — METOCLOPRAMIDE HCL 5 MG/ML IJ SOLN
10.0000 mg | Freq: Once | INTRAMUSCULAR | Status: AC
  Administered 2018-04-05: 10 mg via INTRAVENOUS
  Filled 2018-04-05: qty 2

## 2018-04-05 MED ORDER — DEXAMETHASONE SODIUM PHOSPHATE 10 MG/ML IJ SOLN
10.0000 mg | Freq: Once | INTRAMUSCULAR | Status: AC
  Administered 2018-04-05: 10 mg via INTRAVENOUS
  Filled 2018-04-05: qty 1

## 2018-04-05 MED ORDER — DIPHENHYDRAMINE HCL 50 MG/ML IJ SOLN
25.0000 mg | Freq: Once | INTRAMUSCULAR | Status: AC
  Administered 2018-04-05: 25 mg via INTRAVENOUS
  Filled 2018-04-05: qty 1

## 2018-04-05 MED ORDER — CLONIDINE HCL 0.1 MG PO TABS
0.1000 mg | ORAL_TABLET | Freq: Once | ORAL | Status: AC
  Administered 2018-04-05: 0.1 mg via ORAL
  Filled 2018-04-05: qty 1

## 2018-04-05 MED ORDER — SODIUM CHLORIDE 0.9 % IV BOLUS
1000.0000 mL | Freq: Once | INTRAVENOUS | Status: AC
  Administered 2018-04-05: 1000 mL via INTRAVENOUS

## 2018-04-05 NOTE — ED Provider Notes (Signed)
[facility] EMERGENCY DEPARTMENT Provider Note   CSN: [id number] Arrival date & time: 04/04/18  2317  Time seen 1:42 AM   History   Chief Complaint Chief Complaint  Patient presents with  . Migraine    HPI Katrina [name] is a 57 y.o. female.  HPI   patient states she has a history of migraine headaches.  She states on October 24 she started getting a right-sided headache that radiates into the right side of her neck and then into the left side of her neck into her left shoulder.  She was seen by her primary care provider that day and they thought she had some swollen gland in the left side of her neck and started her on steroids.  The swelling is improved.  She still has pain.  She also had some lightheadedness and nausea at that time.  She states her doctor will not start her on any kind of migraine medication she been on in the past due to her hypertension.  She states what happens is she gets lightheaded and her legs buckle.  She has had several falls.  She is falling down the steps to her apartment before..  She states she fell down about 4 steps that go down to her apartment in the basement on October 26 and she hit the right backside of her head/neck.  She states she landed on concrete.  She denies loss of consciousness.  She denies any extremity numbness but states she started getting tingling in both of her feet a few days ago.  She also feels like her feet are swollen.  She states she feels off balance when she stands and sometimes she gets a spinning sensation that started 2 days ago.  She describes her headache like a vice tightening on her head and feels like her head is exploding.  She states this is different from her typical migraine and that normally her migraine does not last as long as this one.  She also states she had headaches when she had her stroke.  She states she has chronic left-sided weakness.  PCP [name], FNP   Past Medical History:  Diagnosis Date  .  Allergy   . Anemia 1975-1976   . Anxiety    takes Valium daily as needed  . Arthritis    "spine" (12/03/2016)  . Asthma    has inhalers but doesn't use (12/03/2016)  . Chronic bronchitis ([place])    "get it a couple times q yr" (12/03/2016)  . Chronic kidney disease   . Chronic lower back pain    budlging disc   . Claustrophobia   . COPD (chronic obstructive pulmonary disease) ([place])   . Daily headache   . Depression   . Diverticulitis   . Family history of adverse reaction to anesthesia    2 daughters gets extremely sick   . GERD (gastroesophageal reflux disease)    takes Dexilant daily  . Heart murmur   . History of blood transfusion 1975-1976 "several"   no abnormal reaction noted  . History of colitis   . History of colon polyps    benign  . History of gastric ulcer   . History of hiatal hernia    "small one"  . History of MRSA infection 2017  . Hyperlipidemia    was on meds but has been off over a yr  . Hypertension    takes Amlodipine and Maxzide daily  . Insomnia    takes Ambien  nightly  . Iron deficiency anemia    "when I was young"  . Lung nodules   . Migraine    "2-3/wk" (12/03/2016)  . MS (multiple sclerosis) ([place])    questionable per pt  . Noncompliance   . Osteoporosis   . Peripheral neuropathy    weakness,numbness,and tingling. Takes Gabapentin daily  . PTSD (post-traumatic stress disorder) dx'd 2016/2017   "was dx'd w/bipolar in 1991; replaced w/PTSD dx 2016/2017" (12/03/2016)  . Restless leg syndrome    takes Requip daily  . 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 Type II diabetes mellitus ([place])    "went off insulin in 2012/2013" (12/03/2016)    Patient Active Problem List   Diagnosis Date Noted  . 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 Impaired insight 02/08/2018  . Adjustment disorder with disturbance of emotion 02/08/2018  . Left lower  quadrant abdominal pain affecting pregnancy 12/21/2017  . Chest pain in adult 08/20/2017  . Abnormal brain MRI 08/08/2017  . Weakness of left leg 05/19/2017  . Hypertension 12/03/2016  . Adverse food reaction 10/14/2016  . Pulmonary nodules 10/14/2016  . Non-seasonal allergic rhinitis due to fungal spores 10/14/2016  . Cigarette smoker 09/24/2016  . Cerebral microvascular disease 08/19/2016  . Non compliance with medical treatment 05/11/2016  . Leg pain, anterior, right 05/11/2016  . Syncope 05/02/2016  . Type 2 diabetes mellitus with diabetic neuropathy ([place]) 05/02/2016  . Restless leg syndrome 05/02/2016  . History of CVA with residual deficit   . Vascular headache   . History of chest pain   . Generalized anxiety disorder   . Chronic bilateral low back pain without sciatica   . Peripheral neuropathy   . Migraine without status migrainosus, not intractable   . PTSD (post-traumatic stress disorder)   . History of cerebrovascular accident (CVA) with residual deficit   . CVA (cerebral vascular accident) ([place]) 04/03/2016  . Stroke (cerebrum) ([place]) 04/03/2016  . Post traumatic stress disorder 10/06/2015  . Precordial pain   . Migraine syndrome 01/24/2015  . Migraine 01/24/2015  . TIA (transient ischemic attack) 12/25/2014  . Hypertensive urgency   . Atypical chest pain   . Malignant hypertension   . Hypokalemia 04/16/2014  . Muscle weakness (generalized) 04/05/2014  . Stiffness of left hip joint 04/05/2014  . Pain in left hip 04/05/2014  . Diastolic dysfunction 35/00/9381  . Normal coronary arteries 03/20/2014  . PUD (peptic ulcer disease) 03/20/2014  . Type 2 diabetes, uncontrolled, with neuropathy ([place]) 03/20/2014  . Morbid obesity due to excess calories ([place]) 03/20/2014  . Left-sided weakness 03/20/2014  . Paresthesias 03/20/2014  . Cerebrovascular disease 03/20/2014  . Dyspnea on exertion 03/05/2012  . Tobacco abuse 03/05/2012  . Moderate COPD (chronic obstructive  pulmonary disease) ([place]) 11/23/2011  . GERD (gastroesophageal reflux disease) 11/23/2011  . Uncontrolled hypertension 11/23/2011    Past Surgical History:  Procedure Laterality Date  . ABDOMINAL AORTOGRAM N/A 12/03/2016   Procedure: Abdominal Aortogram;  Surgeon: Angelia Mould, MD;  Location: [place] [facility];  Service: Cardiovascular;  Laterality: N/A;  . ABDOMINAL HYSTERECTOMY  12/1987  . ADENOIDECTOMY  1975  . ANKLE SURGERY Bilateral 1993; 1995 X2   "stabilzation done; 1 on the right; 2 on the left"  . APPENDECTOMY  1989  . BREAST EXCISIONAL BIOPSY Left   . BREAST LUMPECTOMY Left    "benign tumor"  . CARDIAC  CATHETERIZATION N/A 01/31/2015   Procedure: Left Heart Cath and Coronary Angiography;  Surgeon: Burnell Blanks, MD;  Location: [place] [facility];  Service: Cardiovascular;  Laterality: N/A;  . COLONOSCOPY    . DILATION AND CURETTAGE OF UTERUS    . ESOPHAGOGASTRODUODENOSCOPY    . LAPAROSCOPIC CHOLECYSTECTOMY  1996  . NASAL RECONSTRUCTION  1976  . NASAL SINUS SURGERY  1975  . POSTERIOR LUMBAR FUSION  2005  . RADIOLOGY WITH ANESTHESIA N/A 01/15/2016   Procedure: MRI LUMBAR SPINE WITHOUT;  Surgeon: Medication Radiologist, MD;  Location: [place];  Service: Radiology;  Laterality: N/A;  . RADIOLOGY WITH ANESTHESIA N/A 06/29/2016   Procedure: MRI OF THE BRAIN WITH AND WITHOUT;  Surgeon: Medication Radiologist, MD;  Location: [facility];  Service: Radiology;  Laterality: N/A;  . RENAL ANGIOGRAPHY N/A 12/03/2016   Procedure: Renal Angiography;  Surgeon: Angelia Mould, MD;  Location: [place] [facility];  Service: Cardiovascular;  Laterality: N/A;  . TONSILLECTOMY  1992  . TUBAL LIGATION    . TUMOR EXCISION  1998   from back of skull; developed;  MRSA from the area that had to be packed     OB History   None      Home Medications    Prior to Admission medications   Medication Sig Start Date End Date Taking? Authorizing Provider  amLODipine (NORVASC) 10 MG  tablet Take 1 tablet (10 mg total) by mouth daily. 06/05/14  Yes Herminio Commons, MD  aspirin-acetaminophen-caffeine (EXCEDRIN EXTRA STRENGTH) [phone] MG tablet Take 2 tablets by mouth daily as needed for headache or migraine.    Yes [provider]  chlorothiazide (DIURIL) 250 MG tablet Take 50 mg by mouth every morning.   Yes [provider]  chlorothiazide (DIURIL) 250 MG/5ML suspension Take by mouth daily.   Yes [provider]  clopidogrel (PLAVIX) 75 MG tablet Take 75 mg by mouth daily.    Yes [provider]  dexlansoprazole (DEXILANT) 60 MG capsule Take 1 tab by mouth every morning. Patient taking differently: Take 60 mg by mouth every evening.  10/18/16  Yes Esterwood, Amy S, PA-C  EPINEPHrine (EPIPEN 2-PAK) 0.3 mg/0.3 mL IJ SOAJ injection Inject into the muscle.   Yes [provider]  gabapentin (NEURONTIN) 600 MG tablet Take 600 mg by mouth 4 (four) times daily.   Yes [provider]  hydrALAZINE (APRESOLINE) 100 MG tablet Take 1 tablet (100 mg total) by mouth 3 (three) times daily. 01/30/18  Yes Herminio Commons, MD  rOPINIRole (REQUIP) 4 MG tablet Take 4 mg by mouth at bedtime.   Yes [provider]  zolpidem (AMBIEN CR) 12.5 MG CR tablet Take 12.5 mg by mouth at bedtime. 05/16/17  Yes [provider]  cloNIDine (CATAPRES - DOSED IN MG/24 HR) 0.2 mg/24hr patch Place 0.2 mg onto the skin once a week.    [provider]    Family History Family History  Problem Relation Age of Onset  . Coronary artery disease Father   . Emphysema Father   . Heart attack Father 54       Died age 67  . Stroke Father   . Cancer Father        Unsure of type   . Allergic rhinitis Father   . Depression Mother   . Skin cancer Mother   . Bipolar disorder Brother   . Drug abuse Brother   . Leukemia Brother   . Bipolar disorder Daughter   .  Allergic rhinitis Daughter   . Asthma Daughter   . Urticaria Daughter    . Diabetes Maternal Grandmother   . Stomach cancer Maternal Grandfather   . Heart attack Sister        Died in 48s  . Heart attack Brother        Died age 16  . Stomach cancer Other   . Liver disease Cousin   . Angioedema Neg Hx   . Atopy Neg Hx   . Eczema Neg Hx   . Immunodeficiency Neg Hx   . Breast cancer Neg Hx   . Colon cancer Neg Hx     Social History Social History   Tobacco Use  . Smoking status: Current Every Day Smoker    Packs/day: 1.00    Years: 45.00    Pack years: 45.00    Types: Cigarettes    Start date: 03/04/1971  . Smokeless tobacco: Never Used  Substance Use Topics  . Alcohol use: No    Alcohol/week: 0.0 standard drinks  . Drug use: No  lives alone in a basement Her daughter lives upstairs   Allergies   Other; Penicillins; Zithromax [azithromycin]; Aspirin; Pineapple; Strawberry extract; Aspartame and phenylalanine; Mushroom extract complex; and Nicardipine   Review of Systems Review of Systems  All other systems reviewed and are negative.    Physical Exam Updated Vital Signs BP (!) 146/81   Pulse 77   Temp 98.3 F (36.8 C) (Oral)   Resp (!) 31   Ht 5\' 7"  (1.702 m)   Wt 100.2 kg   SpO2 97%   BMI 34.61 kg/m   Physical Exam  Constitutional: She is oriented to person, place, and time. She appears well-developed and well-nourished.  Non-toxic appearance. She does not appear ill. No distress.  HENT:  Head: Normocephalic and atraumatic.    Right Ear: External ear normal.  Left Ear: External ear normal.  Nose: Nose normal. No mucosal edema or rhinorrhea.  Mouth/Throat: Oropharynx is clear and moist and mucous membranes are normal. No dental abscesses or uvula swelling.  Eyes: Pupils are equal, round, and reactive to light. Conjunctivae and EOM are normal.  Neck: Normal range of motion.    Area of pain noted, I do not feel a mass under her left mandible that she states she had before.   Cardiovascular: Normal rate, regular rhythm  and normal heart sounds. Exam reveals no gallop and no friction rub.  No murmur heard. Pulmonary/Chest: Effort normal and breath sounds normal. No respiratory distress. She has no wheezes. She has no rhonchi. She has no rales. She exhibits no tenderness and no crepitus.  Abdominal: Soft. Normal appearance and bowel sounds are normal. She exhibits no distension. There is no tenderness. There is no rebound and no guarding.  Musculoskeletal: Normal range of motion. She exhibits no edema or tenderness.  Moves all extremities well.   Neurological: She is alert and oriented to person, place, and time. She has normal strength. No cranial nerve deficit.  Patient has mild left pronator drift which she states is usual, her grips are equal  Skin: Skin is warm, dry and intact. No rash noted. No erythema. No pallor.  Psychiatric: She has a normal mood and affect. Her speech is normal and behavior is normal. Her mood appears not anxious.  Nursing note and vitals reviewed.    ED Treatments / Results  Labs (all labs ordered are listed, but only abnormal results are displayed) Labs Reviewed - No data to display  EKG None  Radiology Ct Head Wo Contrast  Ct Cervical Spine Wo Contrast  Result Date: 04/05/2018 CLINICAL DATA:  Migraine headaches 5 days.  Pain radiates down neck. EXAM: CT HEAD WITHOUT CONTRAST CT CERVICAL SPINE WITHOUT CONTRAST TECHNIQUE: Multidetector CT imaging of the head and cervical spine was performed following the standard protocol without intravenous contrast. Multiplanar CT image reconstructions of the cervical spine were also generated. COMPARISON:  Head CT 02/06/2018 and 12/20/2017 as well as head and cervical spine CT 04/25/2017 FINDINGS: CT HEAD FINDINGS Brain: Ventricles, cisterns and other CSF spaces are normal. There is no mass, mass effect, shift of midline structures or acute hemorrhage. No evidence of acute infarction. Vascular: No hyperdense vessel or unexpected  calcification. Skull: Normal. Negative for fracture or focal lesion. Sinuses/Orbits: Orbits are within normal. Paranasal sinuses are well developed as there is minimal opacification over the ethmoid air cells. Other: None. CT CERVICAL SPINE FINDINGS Alignment: Normal. Skull base and vertebrae: There is mild spondylosis throughout the cervical spine. Vertebral body heights are normal. Atlantoaxial articulation is within normal. There is uncovertebral joint spurring and facet arthropathy. There is no acute fracture or subluxation. There is mild neural foraminal narrowing at several levels of the mid to lower cervical spine due to spurring. Soft tissues and spinal canal: No prevertebral fluid or swelling. No visible canal hematoma. Disc levels: Mild disc space narrowing at the C6-7 level and to lesser extent the C5-6 level. Upper chest: Negative. Other: None. IMPRESSION: No acute brain injury. No acute cervical spine injury. Mild spondylosis of the cervical spine with minimal disc disease at the C5-6 and C6-7 levels. Mild neural foraminal narrowing at multiple levels of the mid to lower cervical spine. Electronically Signed   By: Marin Olp M.D.   On: 04/05/2018 02:38    Procedures Procedures (including critical care time)  Medications Ordered in ED Medications  sodium chloride 0.9 % bolus 1,000 mL (0 mLs Intravenous Stopped 04/05/18 0330)  metoCLOPramide (REGLAN) injection 10 mg (10 mg Intravenous Given 04/05/18 0234)  diphenhydrAMINE (BENADRYL) injection 25 mg (25 mg Intravenous Given 04/05/18 0226)  dexamethasone (DECADRON) injection 10 mg (10 mg Intravenous Given 04/05/18 0227)  cloNIDine (CATAPRES) tablet 0.1 mg (0.1 mg Oral Given 04/05/18 0216)     Initial Impression / Assessment and Plan / ED Course  I have reviewed the triage vital signs and the nursing notes.  Pertinent labs & imaging results that were available during my care of the patient were reviewed by me and considered in my  medical decision making (see chart for details).      Patient was started on migraine cocktail and due to her history of trauma and she is also on Plavix a CT of her head and her cervical spine was done.   Recheck at 3:08 AM patient is sitting up on the stretcher eating chips in no distress.  She states her headache is almost gone.  She states she does not need any other medications.  Her blood pressure is 146/81.  We discussed her CT results which were without acute changes.  She was discharged home.   Final Clinical Impressions(s) / ED Diagnoses   Final diagnoses:  Migraine without aura and without status migrainosus, not intractable  Fall down stairs, initial encounter  Contusion of scalp, initial encounter  Neck pain    ED Discharge Orders    None      Plan discharge  Rolland Porter, MD, Barbette Or, MD 04/05/18 [phone]

## 2018-04-05 NOTE — Telephone Encounter (Signed)
For now, take the medications as prescribed at the time of her last discharge summary on 02/09/2018.  It appears the fundamental issue has been medication noncompliance as per the discharge summary.

## 2018-04-05 NOTE — ED Triage Notes (Signed)
Pt c/o of migraine that started on Friday. Pt has hx of migraines. Pt also c/o pain that goes down left side of neck into left shoulder. Pt has n/v.

## 2018-04-05 NOTE — Discharge Instructions (Addendum)
Look at the information about recurrent migraine headaches.  Try to drink plenty of fluids.

## 2018-04-05 NOTE — Telephone Encounter (Signed)
It would be fine to start chlorthalidone and clonidine.  If the PCP is prescribing this, sodium, potassium, and renal function should be routinely monitored.

## 2018-04-06 NOTE — Telephone Encounter (Signed)
LM with [facility], Dr Court Joy advise on following d/c summary meds pt is to be on and to call back if she doesn't know it

## 2018-04-06 NOTE — Telephone Encounter (Signed)
Patient notified and verbalized understanding. 

## 2018-04-07 IMAGING — CT CT CHEST W/O CM
2 of 3 series · 15 of 36 positions shown, 18 images · non-contrast
Comparison: 09/11/2015

CLINICAL DATA: Follow-up pulmonary nodules

EXAM:
CT CHEST WITHOUT CONTRAST
TECHNIQUE: Multidetector CT imaging of the chest was performed following the
standard protocol without IV contrast.

[Series 2: thorax · axial · 0.68mm/px · z∈[-242,+30]mm · 12 of 160 slices shown, 15 images]
[im 12/160  mediastinal]
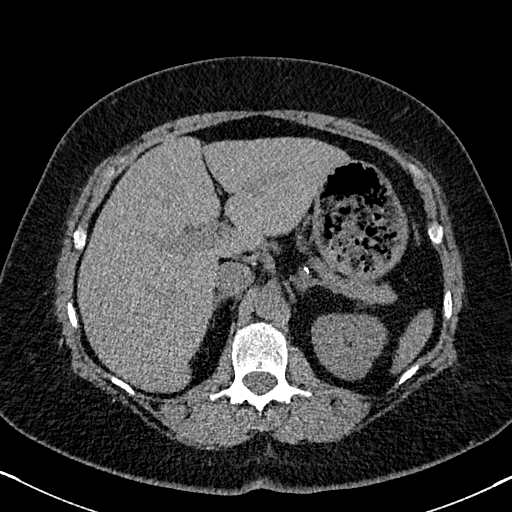
[im 12/160  lung]
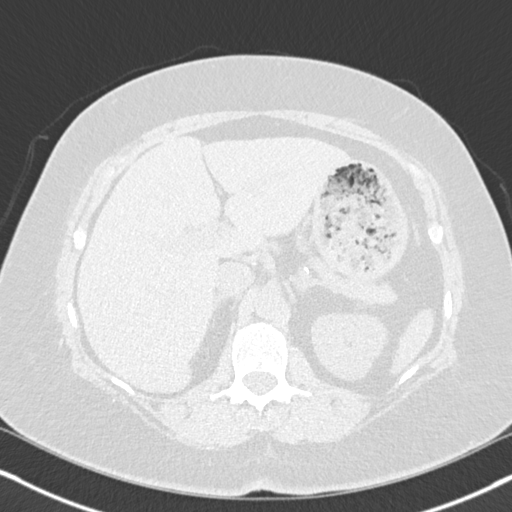
[im 24/160  lung]
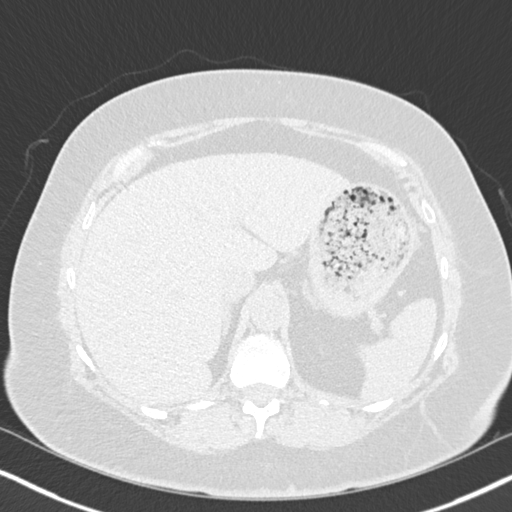
[im 36/160  lung]
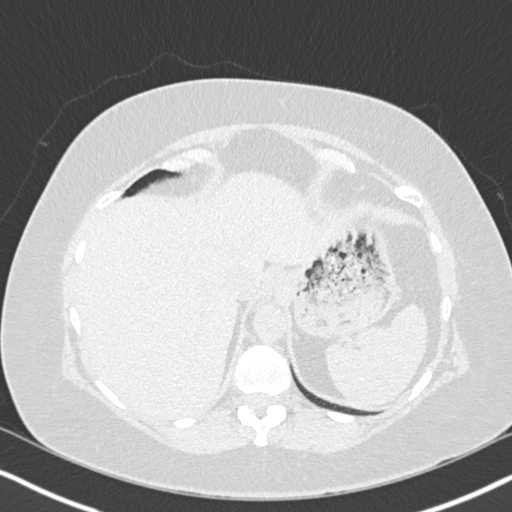
[im 48/160  lung]
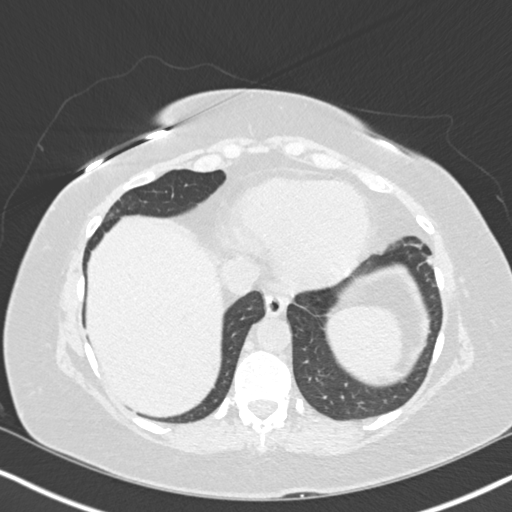
[im 59/160  mediastinal]
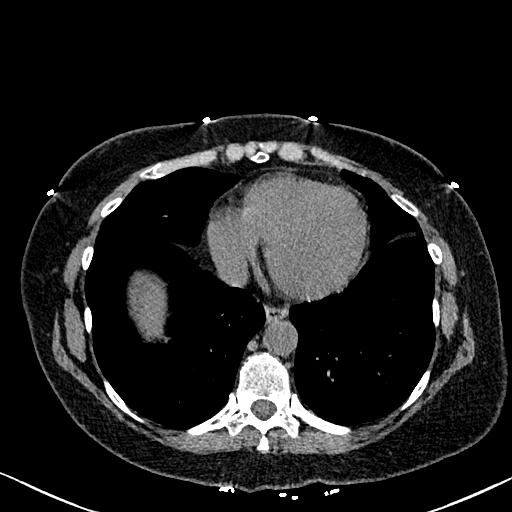
[im 59/160  lung]
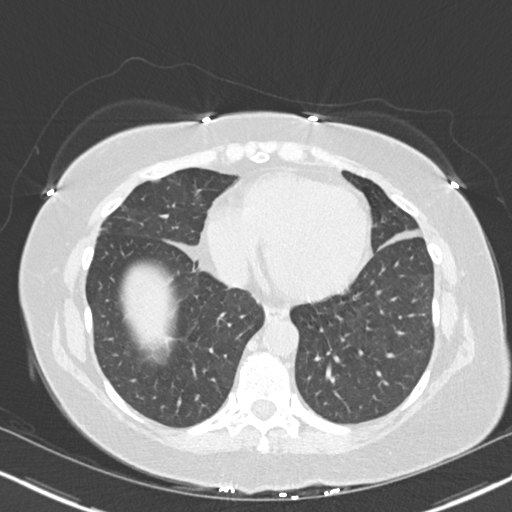
[im 71/160  lung]
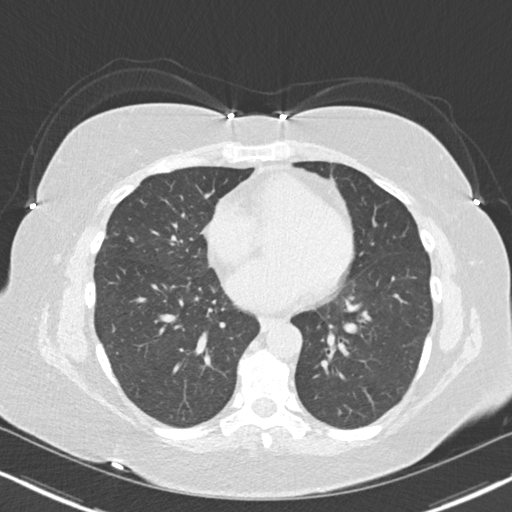
[im 89/160  lung]
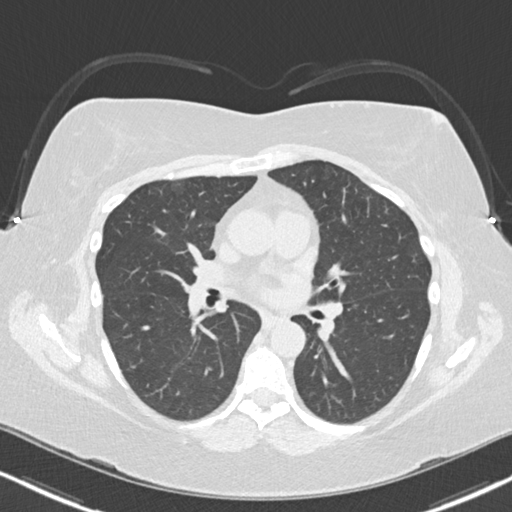
[im 101/160  lung]
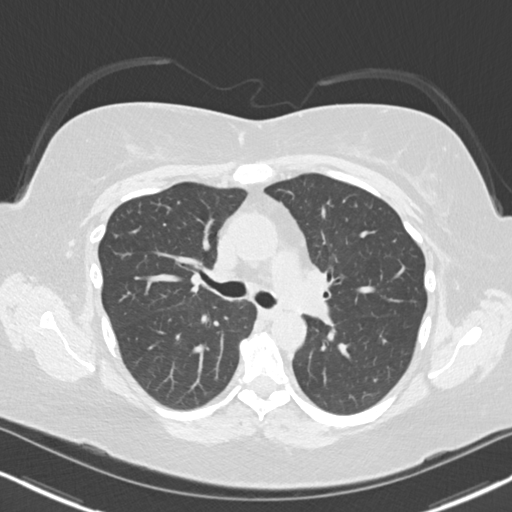
[im 112/160  mediastinal]
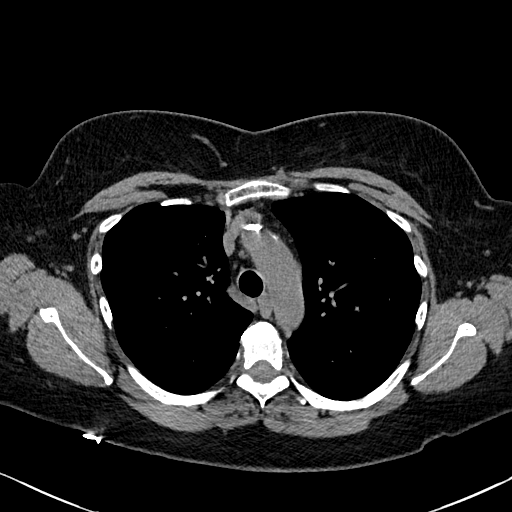
[im 112/160  lung]
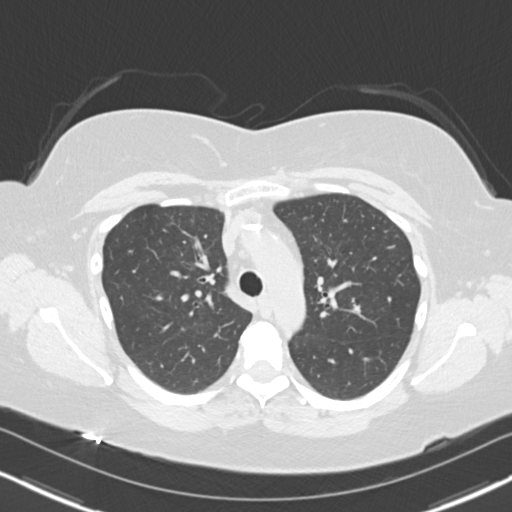
[im 124/160  lung]
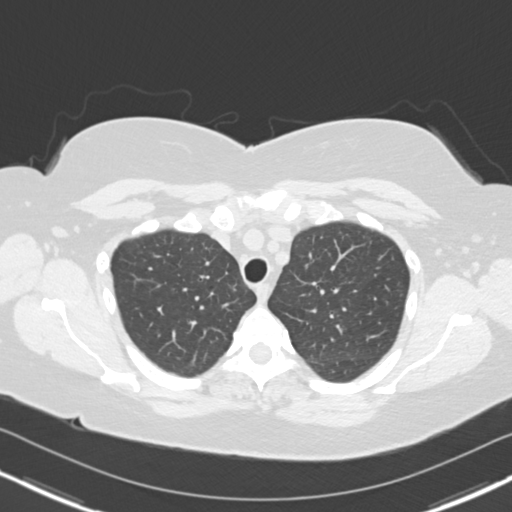
[im 136/160  lung]
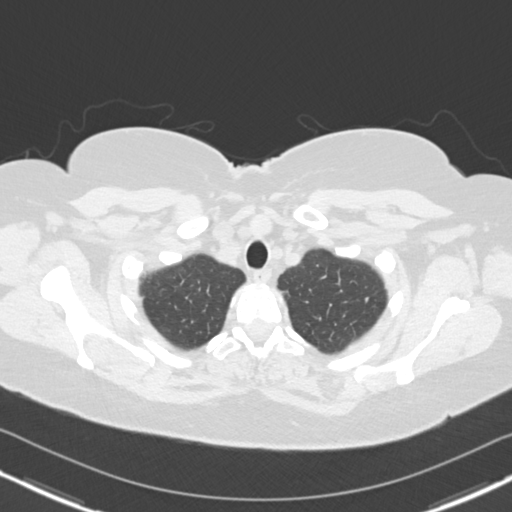
[im 148/160  lung]
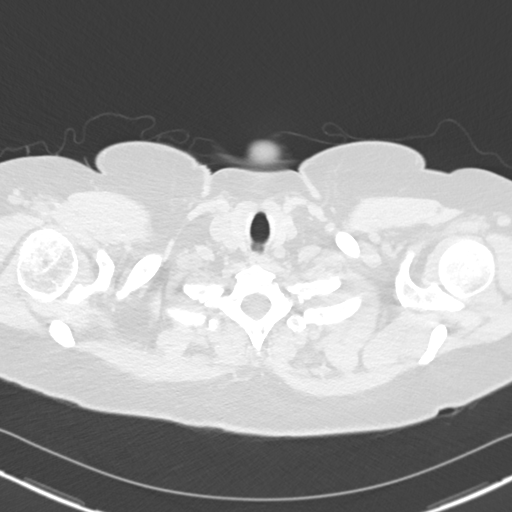

[Series 5: coronal · coronal · 0.62mm/px · 3 of 144 slices shown]
[im 29/144  lung]
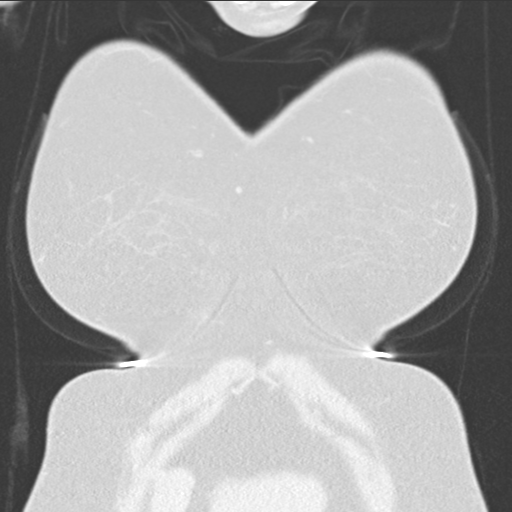
[im 58/144  lung]
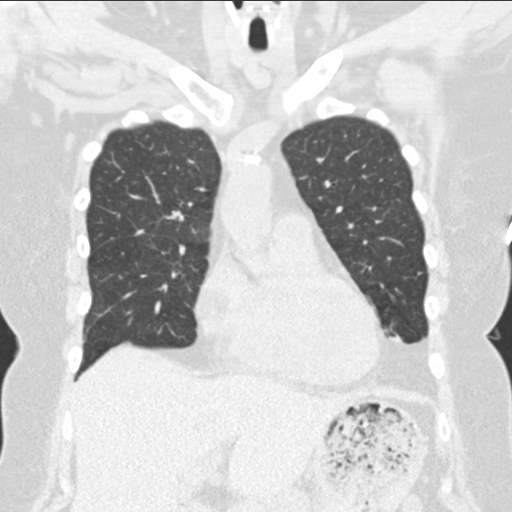
[im 86/144  lung]
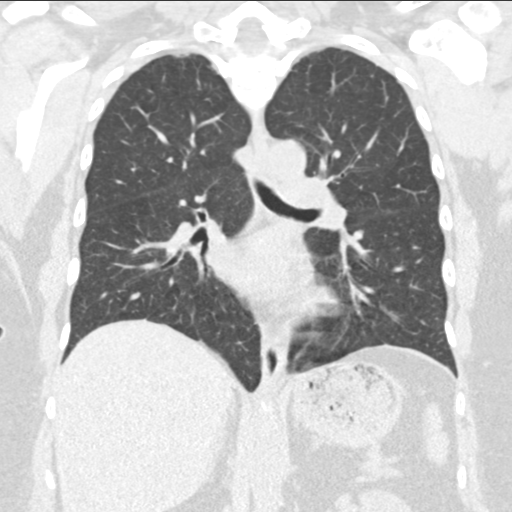

[15 of 36 positions shown; findings below may reference images not displayed]

FINDINGS: Cardiovascular: Somewhat limited due to lack of IV contrast. Aortic
calcifications are seen without aneurysmal dilatation. No
significant cardiac enlargement is seen. No coronary calcifications
are noted.

Mediastinum/Nodes: The thoracic inlet is within normal limits. A
small exophytic nodule is noted arising from the inferior aspect of
the thyroid isthmus measuring approximately 15 mm. No significant
hilar or mediastinal adenopathy is noted. The esophagus as visualize
is within normal limits.

Lungs/Pleura: The lungs are well aerated bilaterally without focal
infiltrate or sizable effusion. A 4 mm left lower lobe nodule is
noted which corresponds to that seen on the prior exam. This is
likely stable given some technical variation in the films with
thinner slice thickness. A 5 mm subpleural nodule is noted in the
right middle lobe best seen on image number 101 of series 4. This
was not as well visualized on the prior exam again due to the
variation in slice thickness. No other sizable nodules are seen.

Upper Abdomen: Cholecystectomy changes are noted. No other focal
abnormality is seen.

Musculoskeletal: No significant bony abnormality is noted.
IMPRESSION: Bilateral pulmonary nodules as described above. There are low both
likely stable from the prior exam given some variation in the
technical imaging. No follow-up needed if patient is low-risk (and
has no known or suspected primary neoplasm). Non-contrast chest CT
can be considered in 12 months if patient is high-risk. This
recommendation follows the consensus statement: Guidelines for
Management of Incidental Pulmonary Nodules Detected on CT Images:

## 2018-04-17 ENCOUNTER — Other Ambulatory Visit (HOSPITAL_COMMUNITY): Payer: Self-pay | Admitting: Family Medicine

## 2018-04-17 DIAGNOSIS — R221 Localized swelling, mass and lump, neck: Secondary | ICD-10-CM

## 2018-04-18 ENCOUNTER — Encounter (HOSPITAL_COMMUNITY): Payer: Self-pay

## 2018-04-18 NOTE — Therapy (Signed)
[place], [place], [address] Phone: [phone]   Fax:  [phone]  Patient Details  Name: Katrina [name] MRN: [id number] Date of Birth: 11-Mar-1961 Referring Provider:  No ref. provider found  Encounter Date: 04/18/2018   OCCUPATIONAL THERAPY DISCHARGE SUMMARY  Visits from Start of Care: 4  Current functional level related to goals / functional outcomes:  OT LONG TERM GOAL #1   Title Patient will return to highest level of independence using RUE as dominant extremity for all tasks.    Time 8   Period Weeks   Status On-going       OT LONG TERM GOAL #2   Title Patient will increase RUE strength to 4/5 to increase ability to complete normal household tasks with less difficulty.    Time 8   Period Weeks   Status On-going       OT LONG TERM GOAL #3   Title Patient will increase A/ROM of RUE to WNL to increase ability to complete reaching tasks overhead.    Time 8   Period Weeks   Status On-going       OT LONG TERM GOAL #4   Title Patient will decrease pain level during functional tasks in RUE to 3/10 or less.    Time 8   Period Weeks   Status On-going       OT LONG TERM GOAL #5   Title Patient will decrease fascial restrictions in RUE to min amount or less to increase functional mobility needed to complete tasks.    Time 8   Period Weeks   Status On-going      Remaining deficits: All deficits remain. Patient requested to be put on hold due to family issues. Did not return to clinic. Patient will be discharged from OT services. 03/29/17 was patient's last therapy session.    Education / Equipment: Shoulder HEP.  Plan: Patient agrees to discharge.  Patient goals were not met. Patient is being discharged due to not returning since the last visit.  ?????           Ailene Ravel, OTR/L,CBIS  [phone]  04/18/2018, 10:52 AM  [place], [place], [address] Phone: [phone]   Fax:  [phone]

## 2018-04-20 ENCOUNTER — Ambulatory Visit (HOSPITAL_COMMUNITY)
Admission: RE | Admit: 2018-04-20 | Discharge: 2018-04-20 | Disposition: A | Discharge status: Home / Self Care | Payer: Medicare Other | Source: Ambulatory Visit | Attending: Family Medicine | Admitting: Family Medicine

## 2018-04-20 DIAGNOSIS — E041 Nontoxic single thyroid nodule: Secondary | ICD-10-CM | POA: Diagnosis not present

## 2018-04-20 DIAGNOSIS — R221 Localized swelling, mass and lump, neck: Secondary | ICD-10-CM | POA: Diagnosis present

## 2018-04-24 ENCOUNTER — Ambulatory Visit (INDEPENDENT_AMBULATORY_CARE_PROVIDER_SITE_OTHER): Payer: Medicare Other | Admitting: Otolaryngology

## 2018-04-24 ENCOUNTER — Other Ambulatory Visit (INDEPENDENT_AMBULATORY_CARE_PROVIDER_SITE_OTHER): Payer: Self-pay | Admitting: Otolaryngology

## 2018-04-24 DIAGNOSIS — R221 Localized swelling, mass and lump, neck: Secondary | ICD-10-CM

## 2018-04-24 DIAGNOSIS — D440 Neoplasm of uncertain behavior of thyroid gland: Secondary | ICD-10-CM | POA: Diagnosis not present

## 2018-04-24 DIAGNOSIS — E041 Nontoxic single thyroid nodule: Secondary | ICD-10-CM

## 2018-05-01 ENCOUNTER — Emergency Department (HOSPITAL_COMMUNITY)
Admission: EM | Admit: 2018-05-01 | Discharge: 2018-05-02 | Disposition: A | Discharge status: Home / Self Care | Payer: Medicare Other | Attending: Emergency Medicine | Admitting: Emergency Medicine

## 2018-05-01 ENCOUNTER — Emergency Department (HOSPITAL_COMMUNITY): Payer: Medicare Other

## 2018-05-01 ENCOUNTER — Other Ambulatory Visit: Payer: Self-pay

## 2018-05-01 ENCOUNTER — Encounter (HOSPITAL_COMMUNITY): Payer: Self-pay | Admitting: Emergency Medicine

## 2018-05-01 DIAGNOSIS — F17210 Nicotine dependence, cigarettes, uncomplicated: Secondary | ICD-10-CM | POA: Insufficient documentation

## 2018-05-01 DIAGNOSIS — E119 Type 2 diabetes mellitus without complications: Secondary | ICD-10-CM | POA: Diagnosis not present

## 2018-05-01 DIAGNOSIS — I160 Hypertensive urgency: Secondary | ICD-10-CM

## 2018-05-01 DIAGNOSIS — R7989 Other specified abnormal findings of blood chemistry: Secondary | ICD-10-CM | POA: Insufficient documentation

## 2018-05-01 DIAGNOSIS — J45909 Unspecified asthma, uncomplicated: Secondary | ICD-10-CM | POA: Insufficient documentation

## 2018-05-01 DIAGNOSIS — R1310 Dysphagia, unspecified: Secondary | ICD-10-CM | POA: Insufficient documentation

## 2018-05-01 DIAGNOSIS — Z79899 Other long term (current) drug therapy: Secondary | ICD-10-CM | POA: Diagnosis not present

## 2018-05-01 DIAGNOSIS — Z8673 Personal history of transient ischemic attack (TIA), and cerebral infarction without residual deficits: Secondary | ICD-10-CM | POA: Insufficient documentation

## 2018-05-01 DIAGNOSIS — Z7901 Long term (current) use of anticoagulants: Secondary | ICD-10-CM | POA: Insufficient documentation

## 2018-05-01 DIAGNOSIS — R079 Chest pain, unspecified: Secondary | ICD-10-CM | POA: Diagnosis present

## 2018-05-01 LAB — I-STAT TROPONIN, ED: Troponin i, poc: 0.01 ng/mL (ref 0.00–0.08)

## 2018-05-01 NOTE — ED Triage Notes (Signed)
Pt C/O chest pain that began around 2000 tonight. Pt states the pain is worse when she takes a deep breath. Pt C/O N/V and SOB. Pt also stating that when she tries to swallow she coughs.

## 2018-05-02 DIAGNOSIS — R1310 Dysphagia, unspecified: Secondary | ICD-10-CM | POA: Diagnosis not present

## 2018-05-02 LAB — BASIC METABOLIC PANEL
Anion gap: 9 (ref 5–15)
BUN: 18 mg/dL (ref 6–20)
CHLORIDE: 105 mmol/L (ref 98–111)
CO2: 23 mmol/L (ref 22–32)
Calcium: 9.2 mg/dL (ref 8.9–10.3)
Creatinine, Ser: 0.82 mg/dL (ref 0.44–1.00)
GFR calc Af Amer: >60 mL/min (ref >60)
GFR calc non Af Amer: >60 mL/min (ref >60)
Glucose, Bld: 96 mg/dL (ref 70–99)
POTASSIUM: 3.7 mmol/L (ref 3.5–5.1)
Sodium: 137 mmol/L (ref 135–145)

## 2018-05-02 LAB — CBC
HEMATOCRIT: 43.9 % (ref 36.0–46.0)
HEMOGLOBIN: 13.9 g/dL (ref 12.0–15.0)
MCH: 28.3 pg (ref 26.0–34.0)
MCHC: 31.7 g/dL (ref 30.0–36.0)
MCV: 89.2 fL (ref 80.0–100.0)
Platelets: 296 10*3/uL (ref 150–400)
RBC: 4.92 MIL/uL (ref 3.87–5.11)
RDW: 14.6 % (ref 11.5–15.5)
WBC: 10.9 10*3/uL — ABNORMAL HIGH (ref 4.0–10.5)
nRBC: 0 % (ref 0.0–0.2)

## 2018-05-02 LAB — TSH: TSH: 6.752 u[IU]/mL — ABNORMAL HIGH (ref 0.350–4.500)

## 2018-05-02 LAB — I-STAT TROPONIN, ED: Troponin i, poc: 0 ng/mL (ref 0.00–0.08)

## 2018-05-02 LAB — BRAIN NATRIURETIC PEPTIDE: B Natriuretic Peptide: 30 pg/mL (ref 0.0–100.0)

## 2018-05-02 MED ORDER — CLONIDINE HCL 0.2 MG PO TABS
0.2000 mg | ORAL_TABLET | Freq: Once | ORAL | Status: DC
  Filled 2018-05-02: qty 1

## 2018-05-02 MED ORDER — HYDRALAZINE HCL 25 MG PO TABS
25.0000 mg | ORAL_TABLET | Freq: Once | ORAL | Status: AC
  Administered 2018-05-02: 25 mg via ORAL
  Filled 2018-05-02: qty 1

## 2018-05-02 NOTE — ED Provider Notes (Signed)
[facility] EMERGENCY DEPARTMENT Provider Note   CSN: [id number] Arrival date & time: 05/01/18  2235     History   Chief Complaint Chief Complaint  Patient presents with  . Chest Pain    HPI AISA Katrina [name] is a 57 y.o. female.  HPI  50 year oldfemalewith medical history significant for Asthma, COPD, Headaches, HTN, HLD, GERD, RLS, PTSD, Depression, Diverticulitis, Insomnia, Bipolar disoder and other comorbidites who presented to the ED with chief complaint of chest pain and choking sensation.  Patient states that she has known history of intermittent chest pain.  She gets chest pain episodes twice a week at least.  It is unclear as to what the underlying cause for her chest pain is.  Her chest pain is usually sternal in nature and there is no specific aggravating or relieving factor.  Patient denies any radiation of her chest pain.  Patient also states that over the past few days she has been feeling like food is getting stuck in her throat.  Yesterday she feels like her symptoms have gotten worse and she now feels short of breath.  She has the sensation that food and water are stuck above her sternal notch.  She has a same sensation swallowing her saliva.  Review of system is also positive for chills, sweats, 6 pound weight gain.  Patient smokes a pack to pack and a half every day for the last 40 years.   Past Medical History:  Diagnosis Date  . Allergy   . Anemia 1975-1976   . Anxiety    takes Valium daily as needed  . Arthritis    "spine" (12/03/2016)  . Asthma    has inhalers but doesn't use (12/03/2016)  . Chronic bronchitis ([place])    "get it a couple times q yr" (12/03/2016)  . Chronic kidney disease   . Chronic lower back pain    budlging disc   . Claustrophobia   . COPD (chronic obstructive pulmonary disease) ([place])   . Daily headache   . Depression   . Diverticulitis   . Family history of adverse reaction to anesthesia    2 daughters gets extremely sick   .  GERD (gastroesophageal reflux disease)    takes Dexilant daily  . Heart murmur   . History of blood transfusion 1975-1976 "several"   no abnormal reaction noted  . History of colitis   . History of colon polyps    benign  . History of gastric ulcer   . History of hiatal hernia    "small one"  . History of MRSA infection 2017  . Hyperlipidemia    was on meds but has been off over a yr  . Hypertension    takes Amlodipine and Maxzide daily  . Insomnia    takes Ambien nightly  . Iron deficiency anemia    "when I was young"  . Lung nodules   . Migraine    "2-3/wk" (12/03/2016)  . MS (multiple sclerosis) ([place])    questionable per pt  . Noncompliance   . Osteoporosis   . Peripheral neuropathy    weakness,numbness,and tingling. Takes Gabapentin daily  . PTSD (post-traumatic stress disorder) dx'd 2016/2017   "was dx'd w/bipolar in 1991; replaced w/PTSD dx 2016/2017" (12/03/2016)  . Restless leg syndrome    takes Requip daily  . 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 Type II diabetes mellitus ([place])    "  went off insulin in 2012/2013" (12/03/2016)    Patient Active Problem List   Diagnosis Date Noted  . 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 Impaired insight 02/08/2018  . Adjustment disorder with disturbance of emotion 02/08/2018  . Left lower quadrant abdominal pain affecting pregnancy 12/21/2017  . Chest pain in adult 08/20/2017  . Abnormal brain MRI 08/08/2017  . Weakness of left leg 05/19/2017  . Hypertension 12/03/2016  . Adverse food reaction 10/14/2016  . Pulmonary nodules 10/14/2016  . Non-seasonal allergic rhinitis due to fungal spores 10/14/2016  . Cigarette smoker 09/24/2016  . Cerebral microvascular disease 08/19/2016  . Non compliance with medical treatment 05/11/2016  . Leg pain, anterior, right 05/11/2016  . Syncope 05/02/2016  . Type 2 diabetes mellitus  with diabetic neuropathy ([place]) 05/02/2016  . Restless leg syndrome 05/02/2016  . History of CVA with residual deficit   . Vascular headache   . History of chest pain   . Generalized anxiety disorder   . Chronic bilateral low back pain without sciatica   . Peripheral neuropathy   . Migraine without status migrainosus, not intractable   . PTSD (post-traumatic stress disorder)   . History of cerebrovascular accident (CVA) with residual deficit   . CVA (cerebral vascular accident) ([place]) 04/03/2016  . Stroke (cerebrum) ([place]) 04/03/2016  . Post traumatic stress disorder 10/06/2015  . Precordial pain   . Migraine syndrome 01/24/2015  . Migraine 01/24/2015  . TIA (transient ischemic attack) 12/25/2014  . Hypertensive urgency   . Atypical chest pain   . Malignant hypertension   . Hypokalemia 04/16/2014  . Muscle weakness (generalized) 04/05/2014  . Stiffness of left hip joint 04/05/2014  . Pain in left hip 04/05/2014  . Diastolic dysfunction 12/45/8099  . Normal coronary arteries 03/20/2014  . PUD (peptic ulcer disease) 03/20/2014  . Type 2 diabetes, uncontrolled, with neuropathy ([place]) 03/20/2014  . Morbid obesity due to excess calories ([place]) 03/20/2014  . Left-sided weakness 03/20/2014  . Paresthesias 03/20/2014  . Cerebrovascular disease 03/20/2014  . Dyspnea on exertion 03/05/2012  . Tobacco abuse 03/05/2012  . Moderate COPD (chronic obstructive pulmonary disease) ([place]) 11/23/2011  . GERD (gastroesophageal reflux disease) 11/23/2011  . Uncontrolled hypertension 11/23/2011    Past Surgical History:  Procedure Laterality Date  . ABDOMINAL AORTOGRAM N/A 12/03/2016   Procedure: Abdominal Aortogram;  Surgeon: Angelia Mould, MD;  Location: [place] [facility];  Service: Cardiovascular;  Laterality: N/A;  . ABDOMINAL HYSTERECTOMY  12/1987  . ADENOIDECTOMY  1975  . ANKLE SURGERY Bilateral 1993; 1995 X2   "stabilzation done; 1 on the right; 2 on the left"  . APPENDECTOMY  1989   . BREAST EXCISIONAL BIOPSY Left   . BREAST LUMPECTOMY Left    "benign tumor"  . CARDIAC CATHETERIZATION N/A 01/31/2015   Procedure: Left Heart Cath and Coronary Angiography;  Surgeon: Burnell Blanks, MD;  Location: [place] [facility];  Service: Cardiovascular;  Laterality: N/A;  . COLONOSCOPY    . DILATION AND CURETTAGE OF UTERUS    . ESOPHAGOGASTRODUODENOSCOPY    . LAPAROSCOPIC CHOLECYSTECTOMY  1996  . NASAL RECONSTRUCTION  1976  . NASAL SINUS SURGERY  1975  . POSTERIOR LUMBAR FUSION  2005  . RADIOLOGY WITH ANESTHESIA N/A 01/15/2016   Procedure: MRI LUMBAR SPINE WITHOUT;  Surgeon: Medication Radiologist, MD;  Location: [place];  Service: Radiology;  Laterality: N/A;  . RADIOLOGY WITH ANESTHESIA N/A 06/29/2016   Procedure: MRI OF THE BRAIN WITH AND WITHOUT;  Surgeon: Medication Radiologist, MD;  Location: [place];  Service: Radiology;  Laterality: N/A;  . RENAL ANGIOGRAPHY N/A 12/03/2016   Procedure: Renal Angiography;  Surgeon: Angelia Mould, MD;  Location: [place] [facility];  Service: Cardiovascular;  Laterality: N/A;  . TONSILLECTOMY  1992  . TUBAL LIGATION    . TUMOR EXCISION  1998   from back of skull; developed;  MRSA from the area that had to be packed     OB History   None      Home Medications    Prior to Admission medications   Medication Sig Start Date End Date Taking? Authorizing Provider  amLODipine (NORVASC) 10 MG tablet Take 1 tablet (10 mg total) by mouth daily. 06/05/14   Herminio Commons, MD  aspirin-acetaminophen-caffeine (EXCEDRIN EXTRA STRENGTH) [phone] MG tablet Take 2 tablets by mouth daily as needed for headache or migraine.     [provider]  chlorothiazide (DIURIL) 250 MG tablet Take 50 mg by mouth every morning.    [provider]  chlorothiazide (DIURIL) 250 MG/5ML suspension Take by mouth daily.    [provider]  cloNIDine (CATAPRES - DOSED IN MG/24 HR) 0.2 mg/24hr patch Place 0.2 mg onto the skin  once a week.    [provider]  clopidogrel (PLAVIX) 75 MG tablet Take 75 mg by mouth daily.     [provider]  dexlansoprazole (DEXILANT) 60 MG capsule Take 1 tab by mouth every morning. Patient taking differently: Take 60 mg by mouth every evening.  10/18/16   Esterwood, Amy S, PA-C  EPINEPHrine (EPIPEN 2-PAK) 0.3 mg/0.3 mL IJ SOAJ injection Inject into the muscle.    [provider]  gabapentin (NEURONTIN) 600 MG tablet Take 600 mg by mouth 4 (four) times daily.    [provider]  hydrALAZINE (APRESOLINE) 100 MG tablet Take 1 tablet (100 mg total) by mouth 3 (three) times daily. 01/30/18   Herminio Commons, MD  rOPINIRole (REQUIP) 4 MG tablet Take 4 mg by mouth at bedtime.    [provider]  zolpidem (AMBIEN CR) 12.5 MG CR tablet Take 12.5 mg by mouth at bedtime. 05/16/17   [provider]    Family History Family History  Problem Relation Age of Onset  . Coronary artery disease Father   . Emphysema Father   . Heart attack Father 51       Died age 80  . Stroke Father   . Cancer Father        Unsure of type   . Allergic rhinitis Father   . Depression Mother   . Skin cancer Mother   . Bipolar disorder Brother   . Drug abuse Brother   . Leukemia Brother   . Bipolar disorder Daughter   . Allergic rhinitis Daughter   . Asthma Daughter   . Urticaria Daughter   . Diabetes Maternal Grandmother   . Stomach cancer Maternal Grandfather   . Heart attack Sister        Died in 16s  . Heart attack Brother        Died age 43  . Stomach cancer Other   . Liver disease Cousin   . Angioedema Neg Hx   . Atopy Neg Hx   . Eczema Neg Hx   . Immunodeficiency Neg Hx   . Breast cancer Neg Hx   . Colon cancer Neg Hx     Social History Social History   Tobacco Use  .  Smoking status: Current Every Day Smoker    Packs/day: 1.00    Years: 45.00    Pack years: 45.00    Types: Cigarettes    Start date: 03/04/1971  . Smokeless  tobacco: Never Used  Substance Use Topics  . Alcohol use: No    Alcohol/week: 0.0 standard drinks  . Drug use: No     Allergies   Other; Penicillins; Zithromax [azithromycin]; Aspirin; Pineapple; Strawberry extract; Aspartame and phenylalanine; Mushroom extract complex; and Nicardipine   Review of Systems Review of Systems  Constitutional: Positive for activity change.  HENT: Positive for trouble swallowing. Negative for voice change.   Respiratory: Positive for shortness of breath.   Cardiovascular: Positive for chest pain.  Gastrointestinal: Positive for vomiting.  All other systems reviewed and are negative.    Physical Exam Updated Vital Signs BP (!) 210/88   Pulse 65   Temp 98.1 F (36.7 C) (Oral)   Resp (!) 21   SpO2 100%   Physical Exam  Constitutional: She is oriented to person, place, and time. She appears well-developed.  HENT:  Head: Atraumatic.  Eyes: EOM are normal.  Neck: Neck supple. No tracheal deviation present. No thyromegaly present.  No goiter  Cardiovascular: Normal rate, intact distal pulses and normal pulses.  Pulmonary/Chest: Effort normal. She has no decreased breath sounds. She has no wheezes. She has no rhonchi. She has no rales.  Abdominal: Bowel sounds are normal.  Musculoskeletal:       Right lower leg: She exhibits no edema.       Left lower leg: She exhibits no edema.  Lymphadenopathy:    She has no cervical adenopathy.  Neurological: She is alert and oriented to person, place, and time.  Skin: Skin is warm and dry.  Nursing note and vitals reviewed.    ED Treatments / Results  Labs (all labs ordered are listed, but only abnormal results are displayed) Labs Reviewed  CBC - Abnormal; Notable for the following components:      Result Value   WBC 10.9 (*)    All other components within normal limits  TSH - Abnormal; Notable for the following components:   TSH 6.752 (*)    All other components within normal limits  BASIC  METABOLIC PANEL  BRAIN NATRIURETIC PEPTIDE  I-STAT TROPONIN, ED  I-STAT TROPONIN, ED    EKG None   Date: 05/02/2018  Rate: 94  Rhythm: normal sinus rhythm  QRS Axis: normal  Intervals: normal  ST/T Wave abnormalities: normal  Conduction Disutrbances: none  Narrative Interpretation: unremarkable   Radiology Dg Chest 2 View  Result Date: 05/01/2018 CLINICAL DATA:  Chest pain EXAM: CHEST - 2 VIEW COMPARISON:  03/22/2018 FINDINGS: Hyperinflation with bronchitic changes. No focal opacity or pleural effusion. Normal heart size. No pneumothorax. IMPRESSION: No active cardiopulmonary disease. Hyperinflation with bronchitic changes Electronically Signed   By: Donavan Foil M.D.   On: 05/01/2018 23:55    Procedures Procedures (including critical care time)  The patient was counseled on the dangers of tobacco use, and was advised to quit and reluctant to quit.  Reviewed strategies to maximize success, including stress management, substitution of other forms of reinforcement and support of family/friends. Discussion 2-3 min.  Medications Ordered in ED Medications  hydrALAZINE (APRESOLINE) tablet 25 mg (25 mg Oral Given 05/02/18 0227)     Initial Impression / Assessment and Plan / ED Course  I have reviewed the triage vital signs and the nursing notes.  Pertinent labs &  imaging results that were available during my care of the patient were reviewed by me and considered in my medical decision making (see chart for details).  Clinical Course as of May 03 227  Tue May 02, 2018  0222 Results from the ER workup discussed with the patient face to face and all questions answered to the best of my ability.  Patient states that she is already seeing ENT.  She saw them last week and there is plan for her to be seen again on 9 December.  I informed her that she has the right clinicians evaluating her, and to continue seeing them.  She states that her PCP send in the referral which again I  told her is appropriate.  We also discussed her TSH.  It appears that they have recommended that patient get thyroid biopsy but she has declined.  I will let the PCP manage her dysphagia, rather than jump the gun and send her to GI for now.  Second troponin is pending.  Patient refused clonidine, stating that it makes her heart rate jumped.  We will give her hydralazine 25 mg instead.  She is stable for discharge once the results of the troponin is back.   [AN]    Clinical Course User Index [AN] Varney Biles, MD    57 year old female comes in with chief complaint of chest pain, choking sensation and shortness of breath.  Her symptoms appear to be unrelated.  She has chronic chest pain that is intermittent in nature and she has these episodes 2 or 3 times a week.  Initial troponin is negative and the EKG is reassuring.  Coronary CT from earlier this year reviewed.  Plan is to get delta troponin for atypical chest pain.  Patient shortness of breath is more associated with this choking sensation that she is been having.  Patient has choking sensation because she has difficulty in swallowing, and a feeling that her food is getting stuck above the sternal notch.  Patient has 40-60-pack-year smoking history now.  Differential diagnosis for this includes polyps, tumor.  Esophagitis is also possible -although there is less pain involved with swallowing.  I think the plan in the ER will be to get delta troponin and have follow up with PCP for further work-up.  She might need to be further imaged or sent to ENT-GI for further evaluation of this dysphagia.  Patient has had 10 CT scans this year including a CT cervical spine that was done within the last month.  CT cervical spine did not reveal any soft tissue abnormalities.  I do think getting a CT soft tissue neck in the ER has more harmless than benefit -so we will defer care to outpatient service.  As far as patient's elevated blood pressure is  concerned, she has history of noncompliance.  Every time she is admitted to the hospital her BP improves, but whenever she comes to the ER it is always elevated.  We will give her home night meds.  There is no acute symptoms because of her chest pain and she is not in any hypertensive emergency.  Final Clinical Impressions(s) / ED Diagnoses   Final diagnoses:  Dysphagia, unspecified type  Hypertensive urgency  Abnormal TSH    ED Discharge Orders    None       Varney Biles, MD 05/02/18 [phone]

## 2018-05-02 NOTE — Discharge Instructions (Addendum)
All the results in the ER are normal, labs and imaging. We are not sure what is causing your symptoms.  The workup in the ER is not complete, and is limited to screening for life threatening and emergent conditions only, so please continue seeing your primary care doctor and ENT doctor for further evaluation.

## 2018-05-02 NOTE — ED Notes (Signed)
Ambulated pt. Pulse oximetry was 98%  And RR was 24 before ambulation. Pulse oximetry dropped to 91% during ambulation and RR increased to 34. Pt's vitals returned to baseline after ambulation.

## 2018-05-10 ENCOUNTER — Other Ambulatory Visit: Payer: Self-pay | Admitting: Student

## 2018-05-11 ENCOUNTER — Other Ambulatory Visit: Payer: Self-pay

## 2018-05-11 ENCOUNTER — Ambulatory Visit (HOSPITAL_COMMUNITY)
Admission: RE | Admit: 2018-05-11 | Discharge: 2018-05-11 | Disposition: A | Discharge status: Home / Self Care | Payer: Medicare Other | Source: Ambulatory Visit | Attending: Otolaryngology | Admitting: Otolaryngology

## 2018-05-11 DIAGNOSIS — E041 Nontoxic single thyroid nodule: Secondary | ICD-10-CM | POA: Insufficient documentation

## 2018-05-11 DIAGNOSIS — Z886 Allergy status to analgesic agent status: Secondary | ICD-10-CM | POA: Insufficient documentation

## 2018-05-11 DIAGNOSIS — E1140 Type 2 diabetes mellitus with diabetic neuropathy, unspecified: Secondary | ICD-10-CM | POA: Diagnosis not present

## 2018-05-11 DIAGNOSIS — Z9102 Food additives allergy status: Secondary | ICD-10-CM | POA: Insufficient documentation

## 2018-05-11 DIAGNOSIS — Z7902 Long term (current) use of antithrombotics/antiplatelets: Secondary | ICD-10-CM | POA: Diagnosis not present

## 2018-05-11 DIAGNOSIS — R011 Cardiac murmur, unspecified: Secondary | ICD-10-CM | POA: Diagnosis not present

## 2018-05-11 DIAGNOSIS — Z8249 Family history of ischemic heart disease and other diseases of the circulatory system: Secondary | ICD-10-CM | POA: Insufficient documentation

## 2018-05-11 DIAGNOSIS — Z880 Allergy status to penicillin: Secondary | ICD-10-CM | POA: Diagnosis not present

## 2018-05-11 DIAGNOSIS — N189 Chronic kidney disease, unspecified: Secondary | ICD-10-CM | POA: Insufficient documentation

## 2018-05-11 DIAGNOSIS — F4310 Post-traumatic stress disorder, unspecified: Secondary | ICD-10-CM | POA: Insufficient documentation

## 2018-05-11 DIAGNOSIS — Z9889 Other specified postprocedural states: Secondary | ICD-10-CM | POA: Diagnosis not present

## 2018-05-11 DIAGNOSIS — M549 Dorsalgia, unspecified: Secondary | ICD-10-CM | POA: Insufficient documentation

## 2018-05-11 DIAGNOSIS — K219 Gastro-esophageal reflux disease without esophagitis: Secondary | ICD-10-CM | POA: Insufficient documentation

## 2018-05-11 DIAGNOSIS — Z79899 Other long term (current) drug therapy: Secondary | ICD-10-CM | POA: Insufficient documentation

## 2018-05-11 DIAGNOSIS — Z881 Allergy status to other antibiotic agents status: Secondary | ICD-10-CM | POA: Diagnosis not present

## 2018-05-11 DIAGNOSIS — Z825 Family history of asthma and other chronic lower respiratory diseases: Secondary | ICD-10-CM | POA: Insufficient documentation

## 2018-05-11 DIAGNOSIS — G2581 Restless legs syndrome: Secondary | ICD-10-CM | POA: Insufficient documentation

## 2018-05-11 DIAGNOSIS — Z8673 Personal history of transient ischemic attack (TIA), and cerebral infarction without residual deficits: Secondary | ICD-10-CM | POA: Insufficient documentation

## 2018-05-11 DIAGNOSIS — F17210 Nicotine dependence, cigarettes, uncomplicated: Secondary | ICD-10-CM | POA: Insufficient documentation

## 2018-05-11 DIAGNOSIS — G4700 Insomnia, unspecified: Secondary | ICD-10-CM | POA: Diagnosis not present

## 2018-05-11 DIAGNOSIS — E1122 Type 2 diabetes mellitus with diabetic chronic kidney disease: Secondary | ICD-10-CM | POA: Diagnosis not present

## 2018-05-11 DIAGNOSIS — I129 Hypertensive chronic kidney disease with stage 1 through stage 4 chronic kidney disease, or unspecified chronic kidney disease: Secondary | ICD-10-CM | POA: Diagnosis not present

## 2018-05-11 DIAGNOSIS — Z8614 Personal history of Methicillin resistant Staphylococcus aureus infection: Secondary | ICD-10-CM | POA: Diagnosis not present

## 2018-05-11 DIAGNOSIS — M545 Low back pain: Secondary | ICD-10-CM | POA: Insufficient documentation

## 2018-05-11 DIAGNOSIS — Z818 Family history of other mental and behavioral disorders: Secondary | ICD-10-CM | POA: Diagnosis not present

## 2018-05-11 DIAGNOSIS — J449 Chronic obstructive pulmonary disease, unspecified: Secondary | ICD-10-CM | POA: Insufficient documentation

## 2018-05-11 LAB — CBC
HCT: 45.9 % (ref 36.0–46.0)
Hemoglobin: 13.5 g/dL (ref 12.0–15.0)
MCH: 28 pg (ref 26.0–34.0)
MCHC: 29.4 g/dL — ABNORMAL LOW (ref 30.0–36.0)
MCV: 95 fL (ref 80.0–100.0)
PLATELETS: 255 10*3/uL (ref 150–400)
RBC: 4.83 MIL/uL (ref 3.87–5.11)
RDW: 14.5 % (ref 11.5–15.5)
WBC: 8.5 10*3/uL (ref 4.0–10.5)
nRBC: 0 % (ref 0.0–0.2)

## 2018-05-11 LAB — PROTIME-INR
INR: 1.06
Prothrombin Time: 13.7 seconds (ref 11.4–15.2)

## 2018-05-11 LAB — APTT: aPTT: 29 seconds (ref 24–36)

## 2018-05-11 MED ORDER — SODIUM CHLORIDE 0.9 % IV SOLN: INTRAVENOUS | Status: DC

## 2018-05-11 MED ORDER — DIPHENHYDRAMINE HCL 50 MG/ML IJ SOLN
50.0000 mg | Freq: Once | INTRAMUSCULAR | Status: AC
  Administered 2018-05-11: 50 mg via INTRAVENOUS

## 2018-05-11 MED ORDER — HYDROCODONE-ACETAMINOPHEN 5-325 MG PO TABS: 1.0000 | ORAL_TABLET | ORAL | Status: DC | PRN

## 2018-05-11 MED ORDER — FLUMAZENIL 1 MG/10ML IV SOLN
INTRAVENOUS | Status: AC
  Filled 2018-05-11: qty 10

## 2018-05-11 MED ORDER — FENTANYL CITRATE (PF) 100 MCG/2ML IJ SOLN
INTRAMUSCULAR | Status: AC
  Filled 2018-05-11: qty 4

## 2018-05-11 MED ORDER — MIDAZOLAM HCL 2 MG/2ML IJ SOLN
INTRAMUSCULAR | Status: AC
  Filled 2018-05-11: qty 6

## 2018-05-11 MED ORDER — LIDOCAINE HCL (PF) 1 % IJ SOLN
INTRAMUSCULAR | Status: AC
  Filled 2018-05-11: qty 30

## 2018-05-11 MED ORDER — NALOXONE HCL 0.4 MG/ML IJ SOLN
INTRAMUSCULAR | Status: AC
  Filled 2018-05-11: qty 1

## 2018-05-11 MED ORDER — DIPHENHYDRAMINE HCL 50 MG/ML IJ SOLN
INTRAMUSCULAR | Status: AC
  Filled 2018-05-11: qty 1

## 2018-05-11 MED ORDER — FENTANYL CITRATE (PF) 100 MCG/2ML IJ SOLN
INTRAMUSCULAR | Status: AC | PRN
  Administered 2018-05-11: 100 ug via INTRAVENOUS
  Administered 2018-05-11 (×4): 50 ug via INTRAVENOUS

## 2018-05-11 MED ORDER — MIDAZOLAM HCL 2 MG/2ML IJ SOLN
INTRAMUSCULAR | Status: AC | PRN
  Administered 2018-05-11: 1 mg via INTRAVENOUS
  Administered 2018-05-11: 2 mg via INTRAVENOUS
  Administered 2018-05-11 (×2): 1 mg via INTRAVENOUS
  Administered 2018-05-11: 2 mg via INTRAVENOUS
  Administered 2018-05-11: 1 mg via INTRAVENOUS

## 2018-05-11 MED ORDER — FENTANYL CITRATE (PF) 100 MCG/2ML IJ SOLN
INTRAMUSCULAR | Status: AC
  Filled 2018-05-11: qty 6

## 2018-05-11 NOTE — Progress Notes (Signed)
Shannon,PA notified of client c/o itching and redness noted on chest arms; order noted

## 2018-05-11 NOTE — Consult Note (Signed)
Chief Complaint: Patient was seen in consultation today for US guided thyroid isthmus nodule biopsy with IV conscious sedation   Referring Physician(s): Teoh,Su  Supervising Physician: Arne Cleveland  Patient Status: [facility] - Out-pt  History of Present Illness: Katrina [name] is a 57 y.o. female smoker with multiple medical problems as listed below who presents now with hx of exophytic thyroid isthmus nodule noted on recent imaging which meets criteria for needle biopsy. She is scheduled today for US guided biopsy of this nodule via IV conscious sedation due to sig anxiety/PTSD.  Past Medical History:  Diagnosis Date  . Allergy   . Anemia 1975-1976   . Anxiety    takes Valium daily as needed  . Arthritis    "spine" (12/03/2016)  . Asthma    has inhalers but doesn't use (12/03/2016)  . Chronic bronchitis ([place])    "get it a couple times q yr" (12/03/2016)  . Chronic kidney disease   . Chronic lower back pain    budlging disc   . Claustrophobia   . COPD (chronic obstructive pulmonary disease) ([place])   . Daily headache   . Depression   . Diverticulitis   . Family history of adverse reaction to anesthesia    2 daughters gets extremely sick   . GERD (gastroesophageal reflux disease)    takes Dexilant daily  . Heart murmur   . History of blood transfusion 1975-1976 "several"   no abnormal reaction noted  . History of colitis   . History of colon polyps    benign  . History of gastric ulcer   . History of hiatal hernia    "small one"  . History of MRSA infection 2017  . Hyperlipidemia    was on meds but has been off over a yr  . Hypertension    takes Amlodipine and Maxzide daily  . Insomnia    takes Ambien nightly  . Iron deficiency anemia    "when I was young"  . Lung nodules   . Migraine    "2-3/wk" (12/03/2016)  . MS (multiple sclerosis) ([place])    questionable per pt  . Noncompliance   . Osteoporosis   . Peripheral neuropathy    weakness,numbness,and  tingling. Takes Gabapentin daily  . PTSD (post-traumatic stress disorder) dx'd 2016/2017   "was dx'd w/bipolar in 1991; replaced w/PTSD dx 2016/2017" (12/03/2016)  . Restless leg syndrome    takes Requip daily  . 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 Type II diabetes mellitus ([place])    "went off insulin in 2012/2013" (12/03/2016)    Past Surgical History:  Procedure Laterality Date  . ABDOMINAL AORTOGRAM N/A 12/03/2016   Procedure: Abdominal Aortogram;  Surgeon: Angelia Mould, MD;  Location: [place] [facility];  Service: Cardiovascular;  Laterality: N/A;  . ABDOMINAL HYSTERECTOMY  12/1987  . ADENOIDECTOMY  1975  . ANKLE SURGERY Bilateral 1993; 1995 X2   "stabilzation done; 1 on the right; 2 on the left"  . APPENDECTOMY  1989  . BREAST EXCISIONAL BIOPSY Left   . BREAST LUMPECTOMY Left    "benign tumor"  . CARDIAC CATHETERIZATION N/A 01/31/2015   Procedure: Left Heart Cath and Coronary Angiography;  Surgeon: Burnell Blanks, MD;  Location: [place] [facility];  Service: Cardiovascular;  Laterality: N/A;  . COLONOSCOPY    . DILATION AND CURETTAGE OF UTERUS    . ESOPHAGOGASTRODUODENOSCOPY    .  LAPAROSCOPIC CHOLECYSTECTOMY  1996  . NASAL RECONSTRUCTION  1976  . NASAL SINUS SURGERY  1975  . POSTERIOR LUMBAR FUSION  2005  . RADIOLOGY WITH ANESTHESIA N/A 01/15/2016   Procedure: MRI LUMBAR SPINE WITHOUT;  Surgeon: Medication Radiologist, MD;  Location: [place];  Service: Radiology;  Laterality: N/A;  . RADIOLOGY WITH ANESTHESIA N/A 06/29/2016   Procedure: MRI OF THE BRAIN WITH AND WITHOUT;  Surgeon: Medication Radiologist, MD;  Location: [facility];  Service: Radiology;  Laterality: N/A;  . RENAL ANGIOGRAPHY N/A 12/03/2016   Procedure: Renal Angiography;  Surgeon: Angelia Mould, MD;  Location: [place] [facility];  Service: Cardiovascular;  Laterality: N/A;  . TONSILLECTOMY  1992  . TUBAL  LIGATION    . TUMOR EXCISION  1998   from back of skull; developed;  MRSA from the area that had to be packed    Allergies: Other; Penicillins; Zithromax [azithromycin]; Aspirin; Pineapple; Strawberry extract; Aspartame and phenylalanine; Mushroom extract complex; and Nicardipine  Medications: Prior to Admission medications   Medication Sig Start Date End Date Taking? Authorizing Provider  amLODipine (NORVASC) 10 MG tablet Take 1 tablet (10 mg total) by mouth daily. 06/05/14   Herminio Commons, MD  aspirin-acetaminophen-caffeine (EXCEDRIN EXTRA STRENGTH) [phone] MG tablet Take 2 tablets by mouth daily as needed for headache or migraine.     [provider]  chlorothiazide (DIURIL) 250 MG tablet Take 50 mg by mouth every morning.    [provider]  chlorothiazide (DIURIL) 250 MG/5ML suspension Take by mouth daily.    [provider]  cloNIDine (CATAPRES - DOSED IN MG/24 HR) 0.2 mg/24hr patch Place 0.2 mg onto the skin once a week.    [provider]  clopidogrel (PLAVIX) 75 MG tablet Take 75 mg by mouth daily.     [provider]  dexlansoprazole (DEXILANT) 60 MG capsule Take 1 tab by mouth every morning. Patient taking differently: Take 60 mg by mouth every evening.  10/18/16   Esterwood, Amy S, PA-C  EPINEPHrine (EPIPEN 2-PAK) 0.3 mg/0.3 mL IJ SOAJ injection Inject into the muscle.    [provider]  gabapentin (NEURONTIN) 600 MG tablet Take 600 mg by mouth 4 (four) times daily.    [provider]  hydrALAZINE (APRESOLINE) 100 MG tablet Take 1 tablet (100 mg total) by mouth 3 (three) times daily. 01/30/18   Herminio Commons, MD  rOPINIRole (REQUIP) 4 MG tablet Take 4 mg by mouth at bedtime.    [provider]  zolpidem (AMBIEN CR) 12.5 MG CR tablet Take 12.5 mg by mouth at bedtime. 05/16/17   [provider]     Family History  Problem Relation Age of Onset  . Coronary artery disease Father   .  Emphysema Father   . Heart attack Father 82       Died age 24  . Stroke Father   . Cancer Father        Unsure of type   . Allergic rhinitis Father   . Depression Mother   . Skin cancer Mother   . Bipolar disorder Brother   . Drug abuse Brother   . Leukemia Brother   . Bipolar disorder Daughter   . Allergic rhinitis Daughter   . Asthma Daughter   . Urticaria Daughter   . Diabetes Maternal Grandmother   . Stomach cancer Maternal Grandfather   . Heart attack Sister        Died in 85s  . Heart attack  Brother        Died age 13  . Stomach cancer Other   . Liver disease Cousin   . Angioedema Neg Hx   . Atopy Neg Hx   . Eczema Neg Hx   . Immunodeficiency Neg Hx   . Breast cancer Neg Hx   . Colon cancer Neg Hx     Social History   Socioeconomic History  . Marital status: Divorced    Spouse name: Not on file  . Number of children: 3  . Years of education: Some college  . Highest education level: Not on file  Occupational History    Comment: Disability for back pain  Social Needs  . Financial resource strain: Not on file  . Food insecurity:    Worry: Not on file    Inability: Not on file  . Transportation needs:    Medical: Not on file    Non-medical: Not on file  Tobacco Use  . Smoking status: Current Every Day Smoker    Packs/day: 1.00    Years: 45.00    Pack years: 45.00    Types: Cigarettes    Start date: 03/04/1971  . Smokeless tobacco: Never Used  Substance and Sexual Activity  . Alcohol use: No    Alcohol/week: 0.0 standard drinks  . Drug use: No  . Sexual activity: Never    Birth control/protection: Surgical  Lifestyle  . Physical activity:    Days per week: Not on file    Minutes per session: Not on file  . Stress: Not on file  Relationships  . Social connections:    Talks on phone: Not on file    Gets together: Not on file    Attends religious service: Not on file    Active member of club or organization: Not on file    Attends meetings of  clubs or organizations: Not on file    Relationship status: Not on file  Other Topics Concern  . Not on file  Social History Narrative   Lives at home with her daughter.   Right-handed.   1-2 cups coffee in the morning and 2 sodas per day.      Review of Systems denies fever, CP,dyspnea, cough, abd pain,N/V or bleeding; she does have occ HA's, back pain  Vital Signs: BP (!) 213/112   Pulse 83   Temp 98.2 F (36.8 C)   Ht 5' 6.5" (1.689 m)   Wt 224 lb (101.6 kg)   SpO2 99%   BMI 35.61 kg/m   Physical Exam awake/alert; chest- CTA bilat; heart- RRR; abd- soft,+BS,NT;no LE edema  Imaging: Dg Chest 2 View  Result Date: 05/01/2018 CLINICAL DATA:  Chest pain EXAM: CHEST - 2 VIEW COMPARISON:  03/22/2018 FINDINGS: Hyperinflation with bronchitic changes. No focal opacity or pleural effusion. Normal heart size. No pneumothorax. IMPRESSION: No active cardiopulmonary disease. Hyperinflation with bronchitic changes Electronically Signed   By: Donavan Foil M.D.   On: 05/01/2018 23:55   US Thyroid  Result Date: 04/20/2018 CLINICAL DATA:  Neck swelling.  Thyroid nodule. EXAM: THYROID ULTRASOUND TECHNIQUE: Ultrasound examination of the thyroid gland and adjacent soft tissues was performed. COMPARISON:  03/16/2017 FINDINGS: Parenchymal Echotexture: Mildly heterogenous Isthmus: 0.3 cm, previously 0.3 cm Right lobe: 4.0 x 1.6 x 1.2 cm, previously 4.1 x 1.1 x 1.2 cm Left lobe: 3.9 x 1.0 x 1.2 cm, previously 4.2 x 1.1 x 1.3 cm _________________________________________________________ Estimated total number of nodules >/= 1 cm: 1 Number of spongiform nodules >/=  2 cm not described below (TR1): 0 Number of mixed cystic and solid nodules >/= 1.5 cm not described below ([place]): 0 _________________________________________________________ Nodule # 1: Prior biopsy: No Location: Isthmus; Inferior Maximum size: 1.7 cm; Other 2 dimensions: 0.6 x 1.2 cm, previously, 1.8 x 1.2 x 1.7 cm Composition: solid/almost  completely solid (2) Echogenicity: hypoechoic (2) Shape: not taller-than-wide (0) Margins: ill-defined (0) Echogenic foci: none (0) ACR TI-RADS total points: 4. ACR TI-RADS risk category:  TR4 (4-6 points). Significant change in size (>/= 20% in two dimensions and minimal increase of 2 mm): No Change in features: Yes; nodule is now hypoechoic. Change in ACR TI-RADS risk category: Yes ACR TI-RADS recommendations: **Given size (>/= 1.5 cm) and appearance, fine needle aspiration of this moderately suspicious nodule should be considered based on TI-RADS criteria. _________________________________________________________ Small lymph nodes at the area of swelling on the left side of the neck. Index lymph node on left side of the neck measures 0.7 cm in the short axis. IMPRESSION: Exophytic isthmus nodule is now hypoechoic and compatible with a TR 4 lesion. Recommend ultrasound-guided biopsy of this exophytic isthmus nodule. No new thyroid nodules. Small lymph nodes at the area of concern on the left side of the neck. The above is in keeping with the ACR TI-RADS recommendations - J Am Coll Radiol 2017;14:587-595. Electronically Signed   By: Markus Daft M.D.   On: 04/20/2018 15:57    Labs:  CBC: Recent Labs    12/21/17 0401 02/06/18 0029 02/07/18 0623 05/01/18 2248  WBC 8.6 11.2* 7.7 10.9*  HGB 12.6 13.3 12.8 13.9  HCT 39.3 40.9 40.0 43.9  PLT 228 241 225 296    COAGS: Recent Labs    05/16/17 2050  INR 1.01  APTT 28    BMP: Recent Labs    12/21/17 0401 02/06/18 0029 02/07/18 0623 05/01/18 2248  NA 143 143 142 137  K 3.7 3.7 3.8 3.7  CL 109 109 110 105  CO2 26 26 26 23   GLUCOSE 121* 119* 91 96  BUN 15 18 15 18   CALCIUM 8.8* 9.0 8.7* 9.2  CREATININE 0.96 0.81 0.85 0.82  GFRNONAA >60 >60 >60 >60  GFRAA >60 >60 >60 >60    LIVER FUNCTION TESTS: Recent Labs    08/23/17 0525 11/02/17 1617 12/20/17 1518 02/06/18 0029  BILITOT 0.4 0.4 0.4 0.3  AST 21 17 15 15   ALT 31 15 14 15     ALKPHOS 68 70 67 75  PROT 6.1* 6.7 6.7 7.1  ALBUMIN 3.3* 3.6 3.6 3.9    TUMOR MARKERS: No results for input(s): AFPTM, CEA, CA199, CHROMGRNA in the last 8760 hours.  Assessment and Plan: Pt with hx anxiety/PTSD, thyroid isthmus nodule noted on recent US which meets criteria for needle biopsy. She presents today for US guided biopsy of this nodule with IV conscious sedation.Risks and benefits discussed with the patient including, but not limited to bleeding, infection, damage to adjacent structures or low yield requiring additional tests.  All of the patient's questions were answered, patient is agreeable to proceed. Consent signed and in chart.  Her last dose of plavix (past CVA) was 8 days ago   Thank you for this interesting consult.  I greatly enjoyed meeting SALLEE HOGREFE and look forward to participating in their care.  A copy of this report was sent to the requesting provider on this date.  Electronically Signed: D. Rowe Robert, PA-C 05/11/2018, 11:59 AM   I spent a total of 20 minutes  in face to face in clinical consultation, greater than 50% of which was counseling/coordinating care for image guided thyroid nodule biopsy

## 2018-05-11 NOTE — Progress Notes (Signed)
Shannon,PA notified client c/o throat feeling tight; redness has decreased on arms and chest

## 2018-05-11 NOTE — Progress Notes (Signed)
Client states no further tightness in chest;Shannon,PA to see

## 2018-05-11 NOTE — Discharge Instructions (Addendum)
Thyroid Biopsy, Care After Refer to this sheet in the next few weeks. These instructions provide you with information on caring for yourself after your procedure. Your health care provider may also give you more specific instructions. Your treatment has been planned according to current medical practices, but problems sometimes occur. Call your health care provider if you have any problems or questions after your procedure. What can I expect after the procedure? After your procedure, it is typical to have the following:  You may have soreness and tenderness at the biopsy site for a few days.  You may have a sore throat or a hoarse voice if you had an open biopsy. This should go away after a couple days.  Follow these instructions at home:  Take medicines only as directed by your health care provider.  To ease discomfort at the biopsy site: ? Keep your head raised on a pillow when you are lying down. ? Support the back of your head and neck with both hands as you sit up from a lying position.  If you have a sore throat, try using throat lozenges or gargling with warm salt water.  Keep all follow-up visits as directed by your health care provider. This is important. Contact a health care provider if:  You have a fever. Get help right away if:  You have severe bleeding from the biopsy site.  You have difficulty swallowing.  You have drainage, redness, swelling, or pain at the biopsy site.  You have swollen glands (lymph nodes) in your neck. This information is not intended to replace advice given to you by your health care provider. Make sure you discuss any questions you have with your health care provider. Document Released: 12/19/2013 Document Revised: 01/25/2016 Document Reviewed: 08/16/2013 Elsevier Interactive Patient Education  2018 [place].    Moderate Conscious Sedation, Adult, Care After These instructions provide you with information about caring for yourself  after your procedure. Your health care provider may also give you more specific instructions. Your treatment has been planned according to current medical practices, but problems sometimes occur. Call your health care provider if you have any problems or questions after your procedure. What can I expect after the procedure? After your procedure, it is common:  To feel sleepy for several hours.  To feel clumsy and have poor balance for several hours.  To have poor judgment for several hours.  To vomit if you eat too soon.  Follow these instructions at home: For at least 24 hours after the procedure:   Do not: ? Participate in activities where you could fall or become injured. ? Drive. ? Use heavy machinery. ? Drink alcohol. ? Take sleeping pills or medicines that cause drowsiness. ? Make important decisions or sign legal documents. ? Take care of children on your own.  Rest. Eating and drinking  Follow the diet recommended by your health care provider.  If you vomit: ? Drink water, juice, or soup when you can drink without vomiting. ? Make sure you have little or no nausea before eating solid foods. General instructions  Have a responsible adult stay with you until you are awake and alert.  Take over-the-counter and prescription medicines only as told by your health care provider.  If you smoke, do not smoke without supervision.  Keep all follow-up visits as told by your health care provider. This is important. Contact a health care provider if:  You keep feeling nauseous or you keep vomiting.  You feel  light-headed.  You develop a rash.  You have a fever. Get help right away if:  You have trouble breathing. This information is not intended to replace advice given to you by your health care provider. Make sure you discuss any questions you have with your health care provider. Document Released: 03/14/2013 Document Revised: 10/27/2015 Document Reviewed:  09/13/2015 Elsevier Interactive Patient Education  [employer].

## 2018-05-11 NOTE — Progress Notes (Signed)
S: Patient post FNA thyroid nodule with sedation - received 6 mg Versed, 300 mcg fentanyl during procedure. Patient reports full body itching and tight sensation in throat - request to see patient prior to d/c home. Patients states itching sensation all over her body without rash or swelling. She states the tight sensation in her throat has subsided, she is able to drink coffee without issue, she has not had any difficulty breathing, wheezing, facial swelling or voice changes. She states she would like to go home.  O:  Gen: Patient sitting up in bed, not in distress, able to speak in full sentences, meal tray and coffee at bedside. HEENT: MMM, no edema/rash noted to eyes, lips or throat.  Cardiac: RRR Pulm: CTAB, no wheezes/rales/rhonchi. Able to take deep breaths easily. Abd: Non-tender, non-distended Skin: dry, excoriations present to upper and lower extremities; no rashes/edema/color change. Psych: A&O x 3   A/P: Patient with post procedure itching and intermittent sensation of throat tightness likely 2/2 to narcotic medication. No concern for anaphylactic reaction at this time - 50 mg benadryl given IV with some improvement in symptoms. Patient stable for d/c. Discussed s/s of anaphylaxis and return to ED if indicated - patient states understanding and wishes to return home at this time.  Candiss Norse, PA-C 05/11/18 1615 Pager# [phone]

## 2018-05-11 NOTE — Procedures (Signed)
  Procedure: [place] FNA isthmic thyroid nodule +Afirma EBL:   minimal Complications:  none immediate  See full dictation in [facility].  Dillard Cannon MD Main # [phone] Pager  [phone]

## 2018-05-13 IMAGING — DX DG CHEST 2V
2 series · 2 of 2 positions shown · non-contrast
Comparison: 08/25/2016

CLINICAL DATA: Left-sided chest pain

EXAM:
CHEST  2 VIEW

[chest pa]
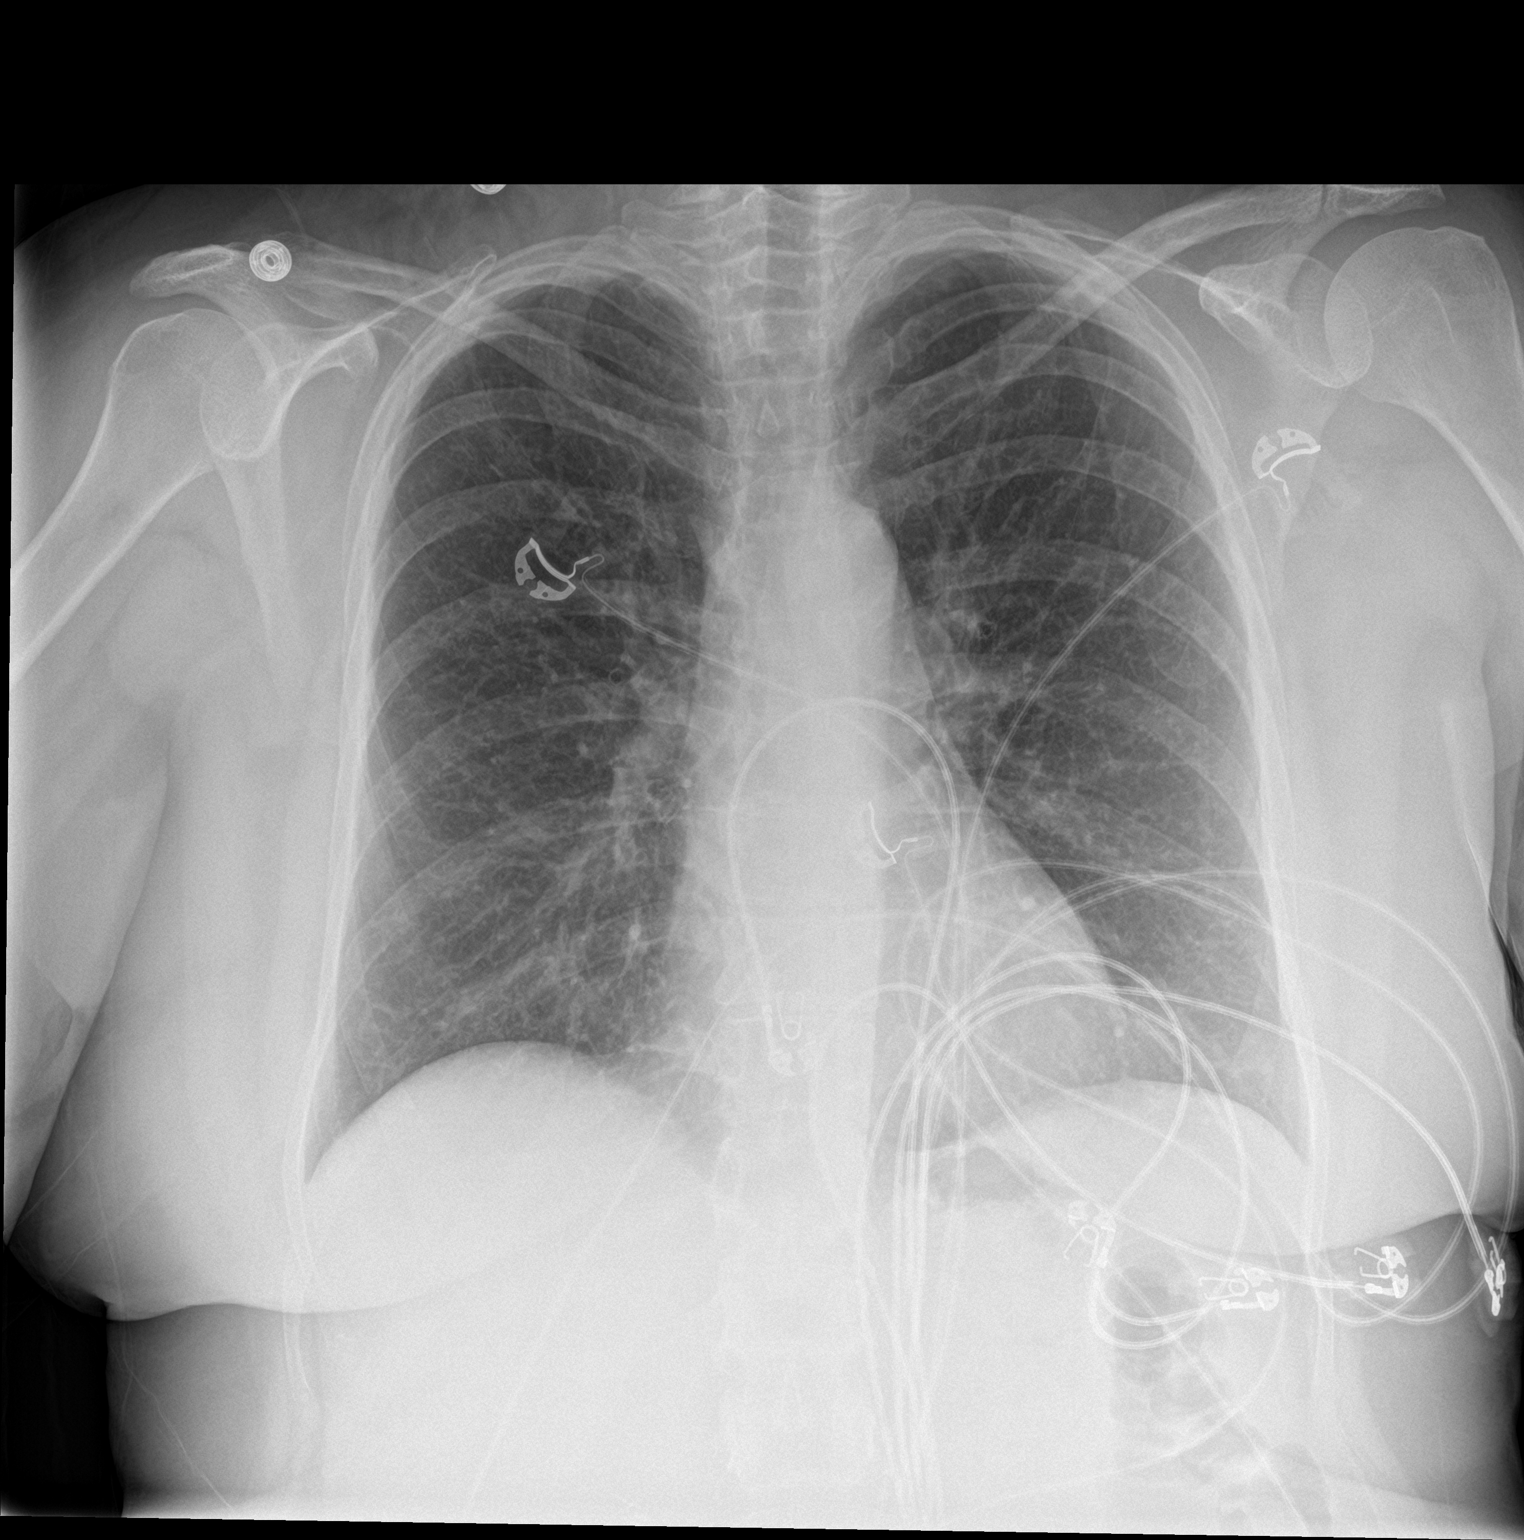

[chest lat]
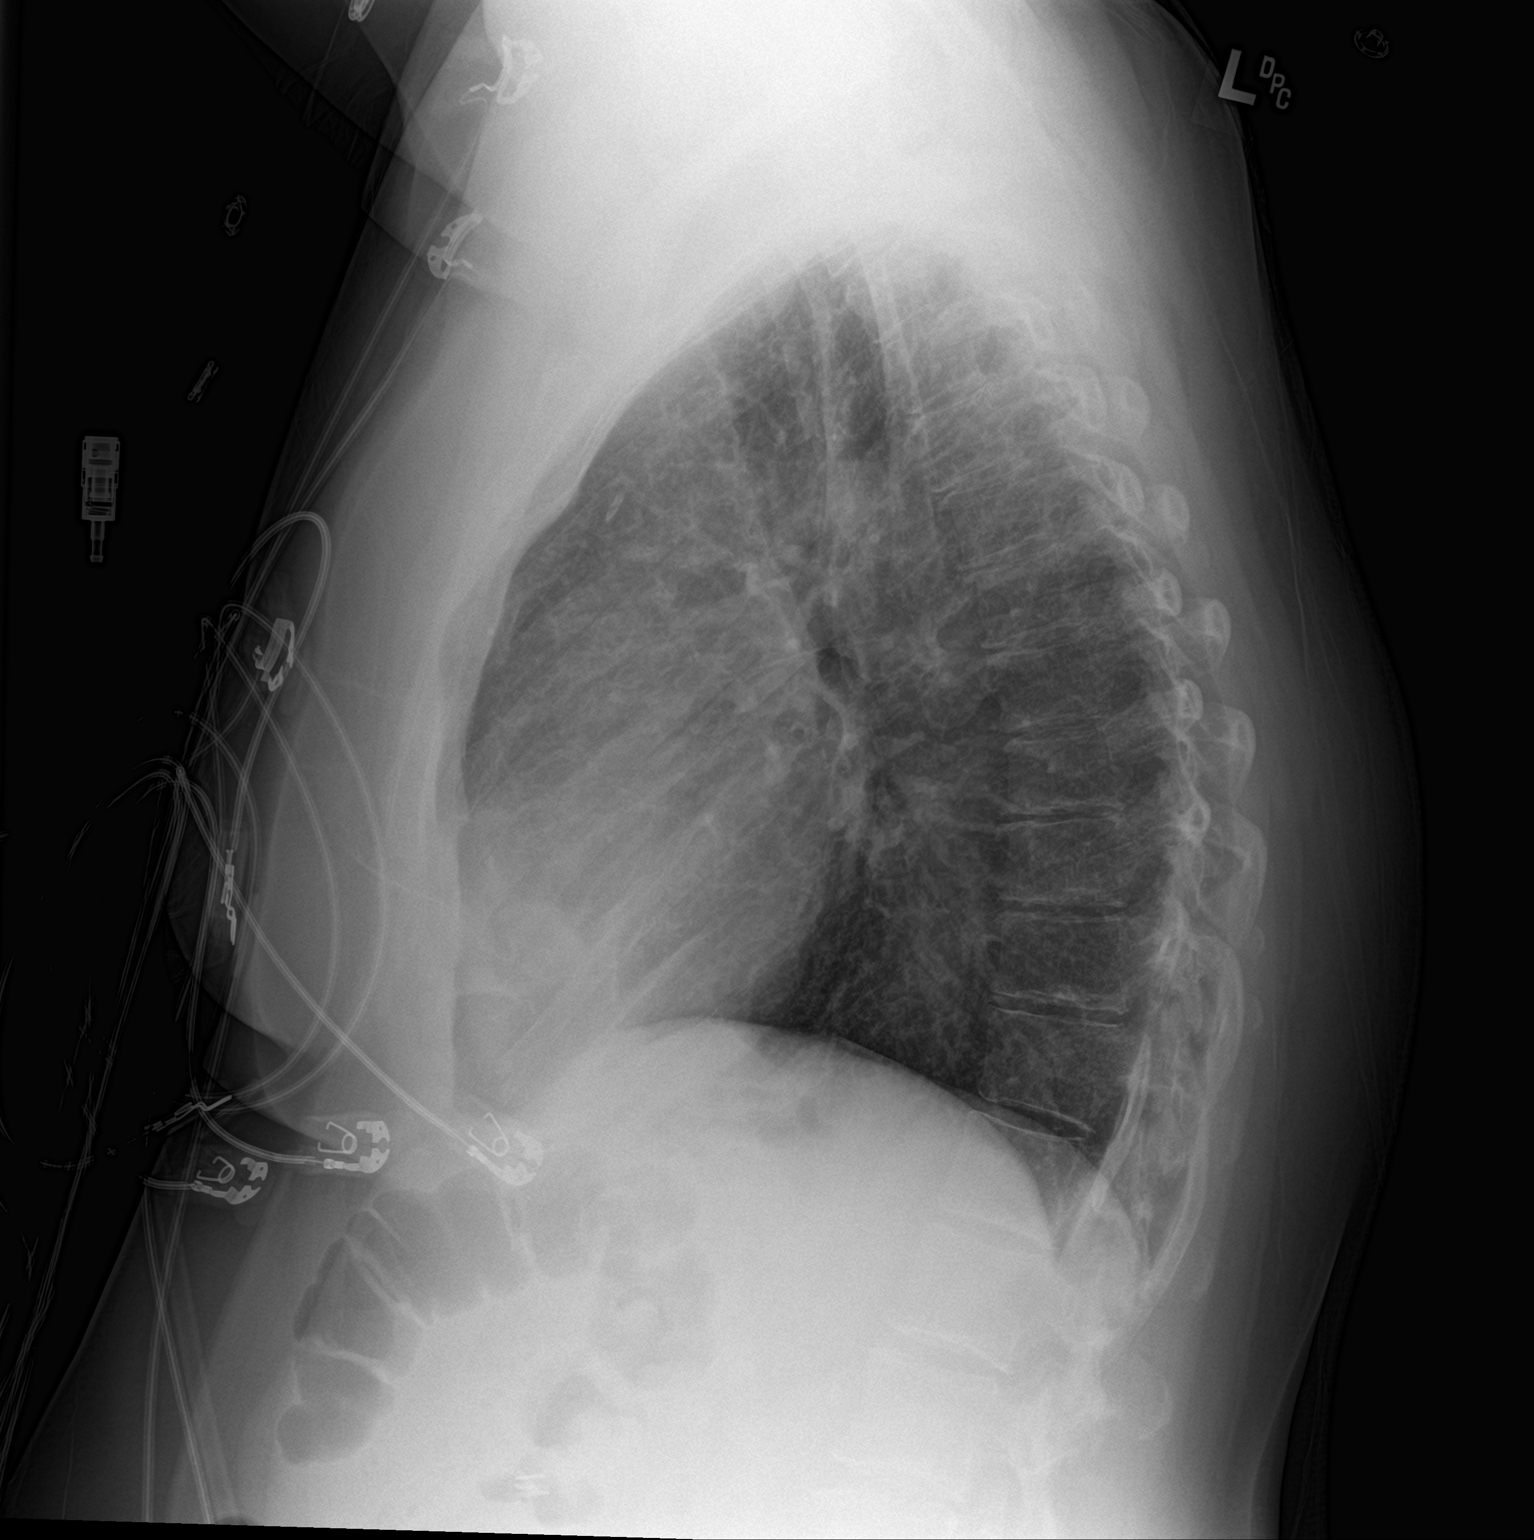

[2 of 2 positions shown; findings below may reference images not displayed]

FINDINGS: The heart size and mediastinal contours are within normal limits.
Both lungs are clear. The visualized skeletal structures are
unremarkable.
IMPRESSION: No active cardiopulmonary disease.

## 2018-05-15 ENCOUNTER — Ambulatory Visit (INDEPENDENT_AMBULATORY_CARE_PROVIDER_SITE_OTHER): Payer: Medicare Other | Admitting: Otolaryngology

## 2018-05-15 DIAGNOSIS — D440 Neoplasm of uncertain behavior of thyroid gland: Secondary | ICD-10-CM

## 2018-05-15 DIAGNOSIS — R1312 Dysphagia, oropharyngeal phase: Secondary | ICD-10-CM

## 2018-05-16 ENCOUNTER — Other Ambulatory Visit (INDEPENDENT_AMBULATORY_CARE_PROVIDER_SITE_OTHER): Payer: Self-pay | Admitting: Otolaryngology

## 2018-05-16 DIAGNOSIS — E079 Disorder of thyroid, unspecified: Secondary | ICD-10-CM

## 2018-06-01 ENCOUNTER — Ambulatory Visit (HOSPITAL_COMMUNITY)
Admission: RE | Admit: 2018-06-01 | Discharge: 2018-06-01 | Disposition: A | Discharge status: Home / Self Care | Payer: Medicare Other | Source: Ambulatory Visit | Attending: Otolaryngology | Admitting: Otolaryngology

## 2018-06-01 DIAGNOSIS — E079 Disorder of thyroid, unspecified: Secondary | ICD-10-CM | POA: Diagnosis not present

## 2018-06-01 MED ORDER — IOHEXOL 300 MG/ML  SOLN
75.0000 mL | Freq: Once | INTRAMUSCULAR | Status: AC | PRN
  Administered 2018-06-01: 75 mL via INTRAVENOUS

## 2018-06-18 IMAGING — DX DG SHOULDER 2+V*R*
3 series · 3 of 3 positions shown · non-contrast
Comparison: Right shoulder radiographs 06/23/2015

CLINICAL DATA: Right shoulder pain. Pain a glenohumeral joint.
Limited rotation and flexion. No known injury.

EXAM:
RIGHT SHOULDER - 2+ VIEW

[shoulder ap]
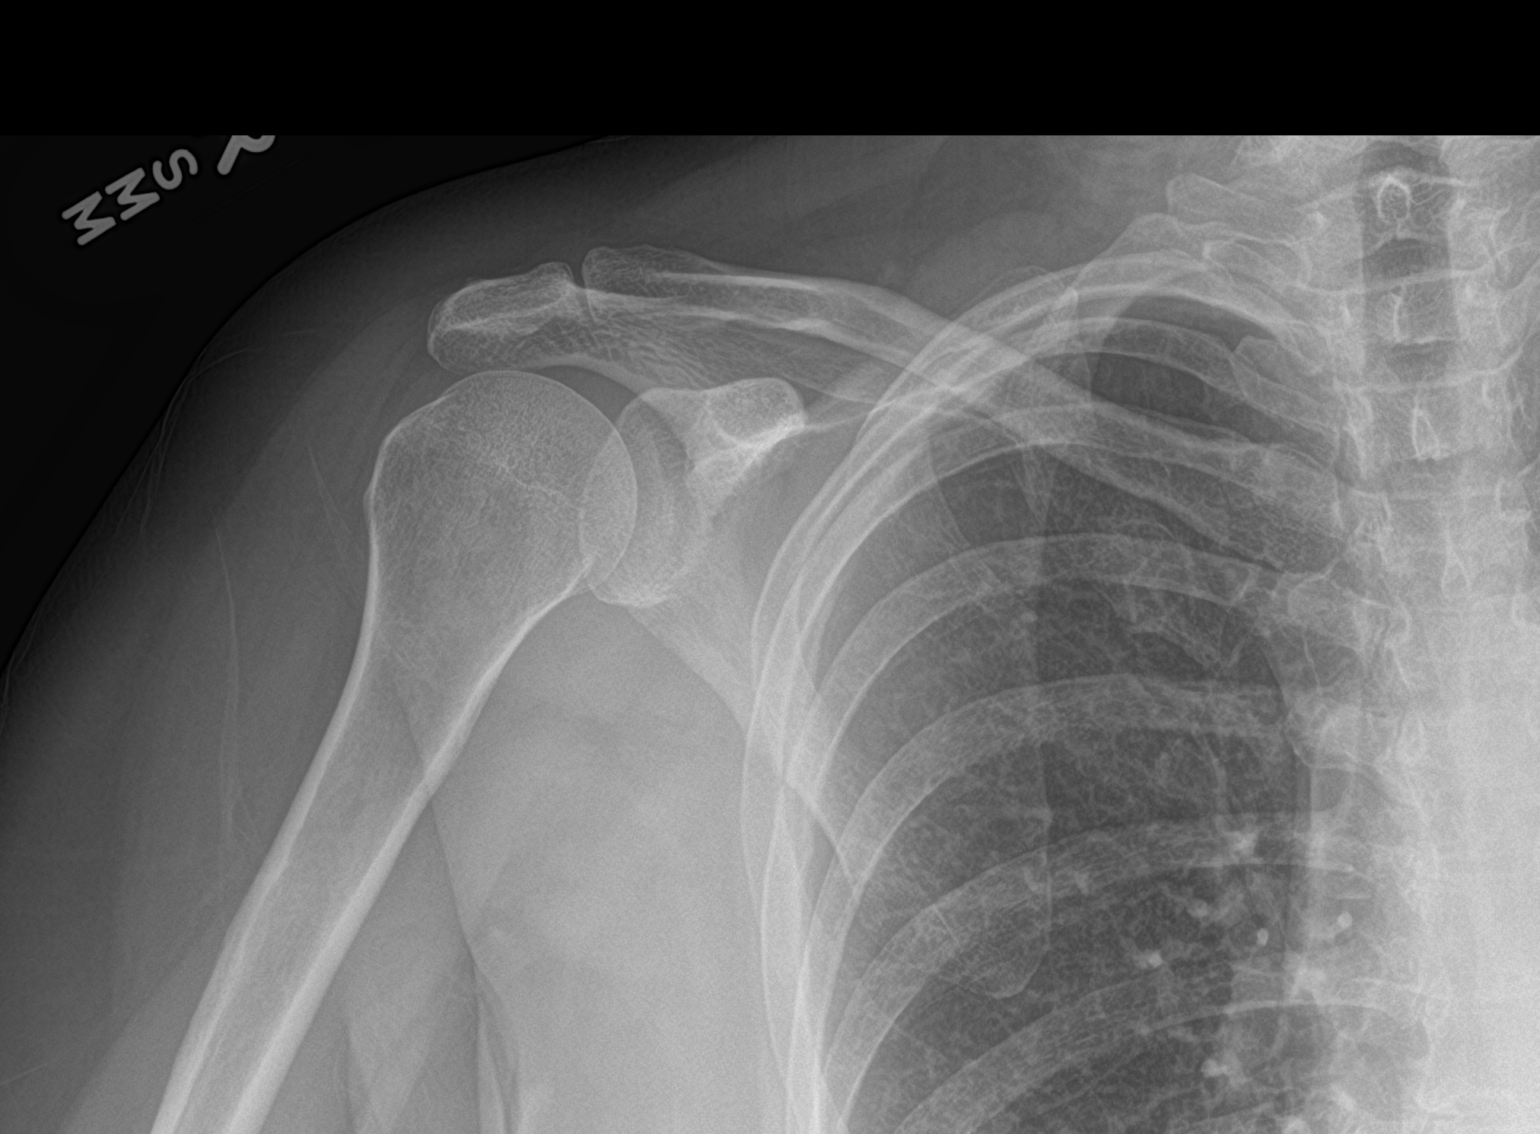

[shoulder grashey]
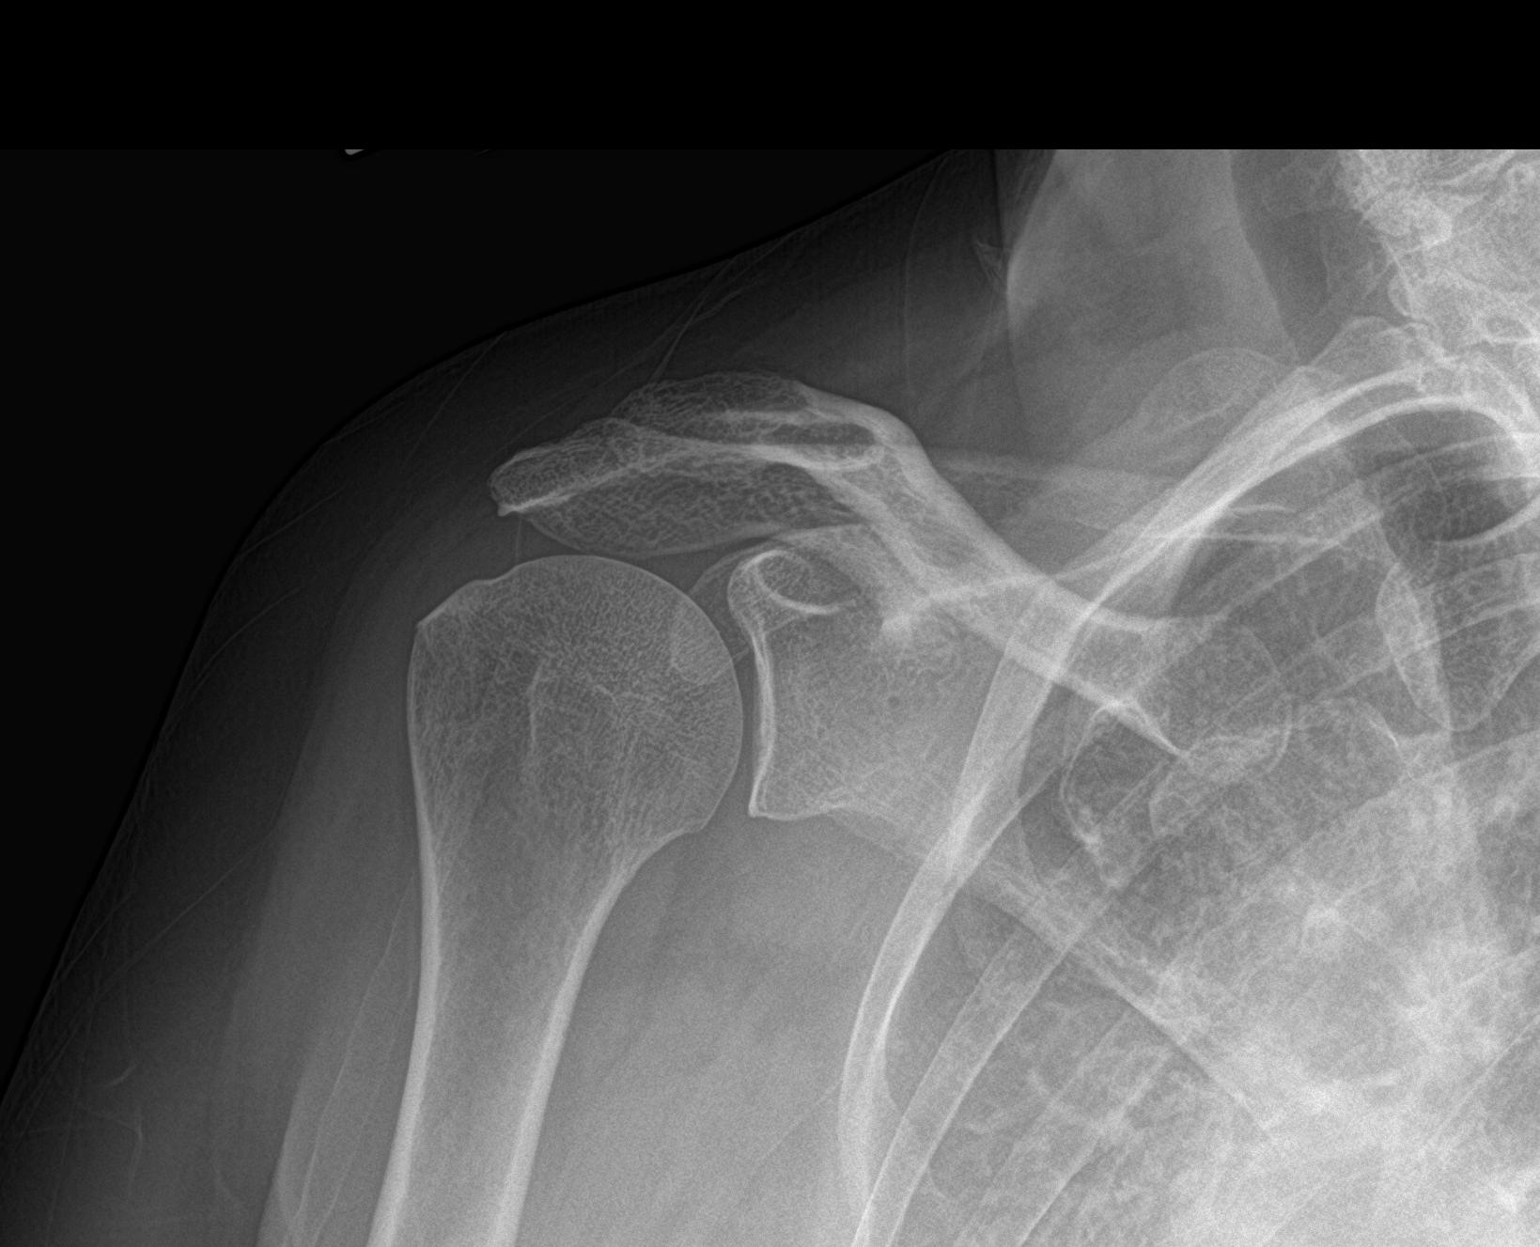

[shoulder y-view]
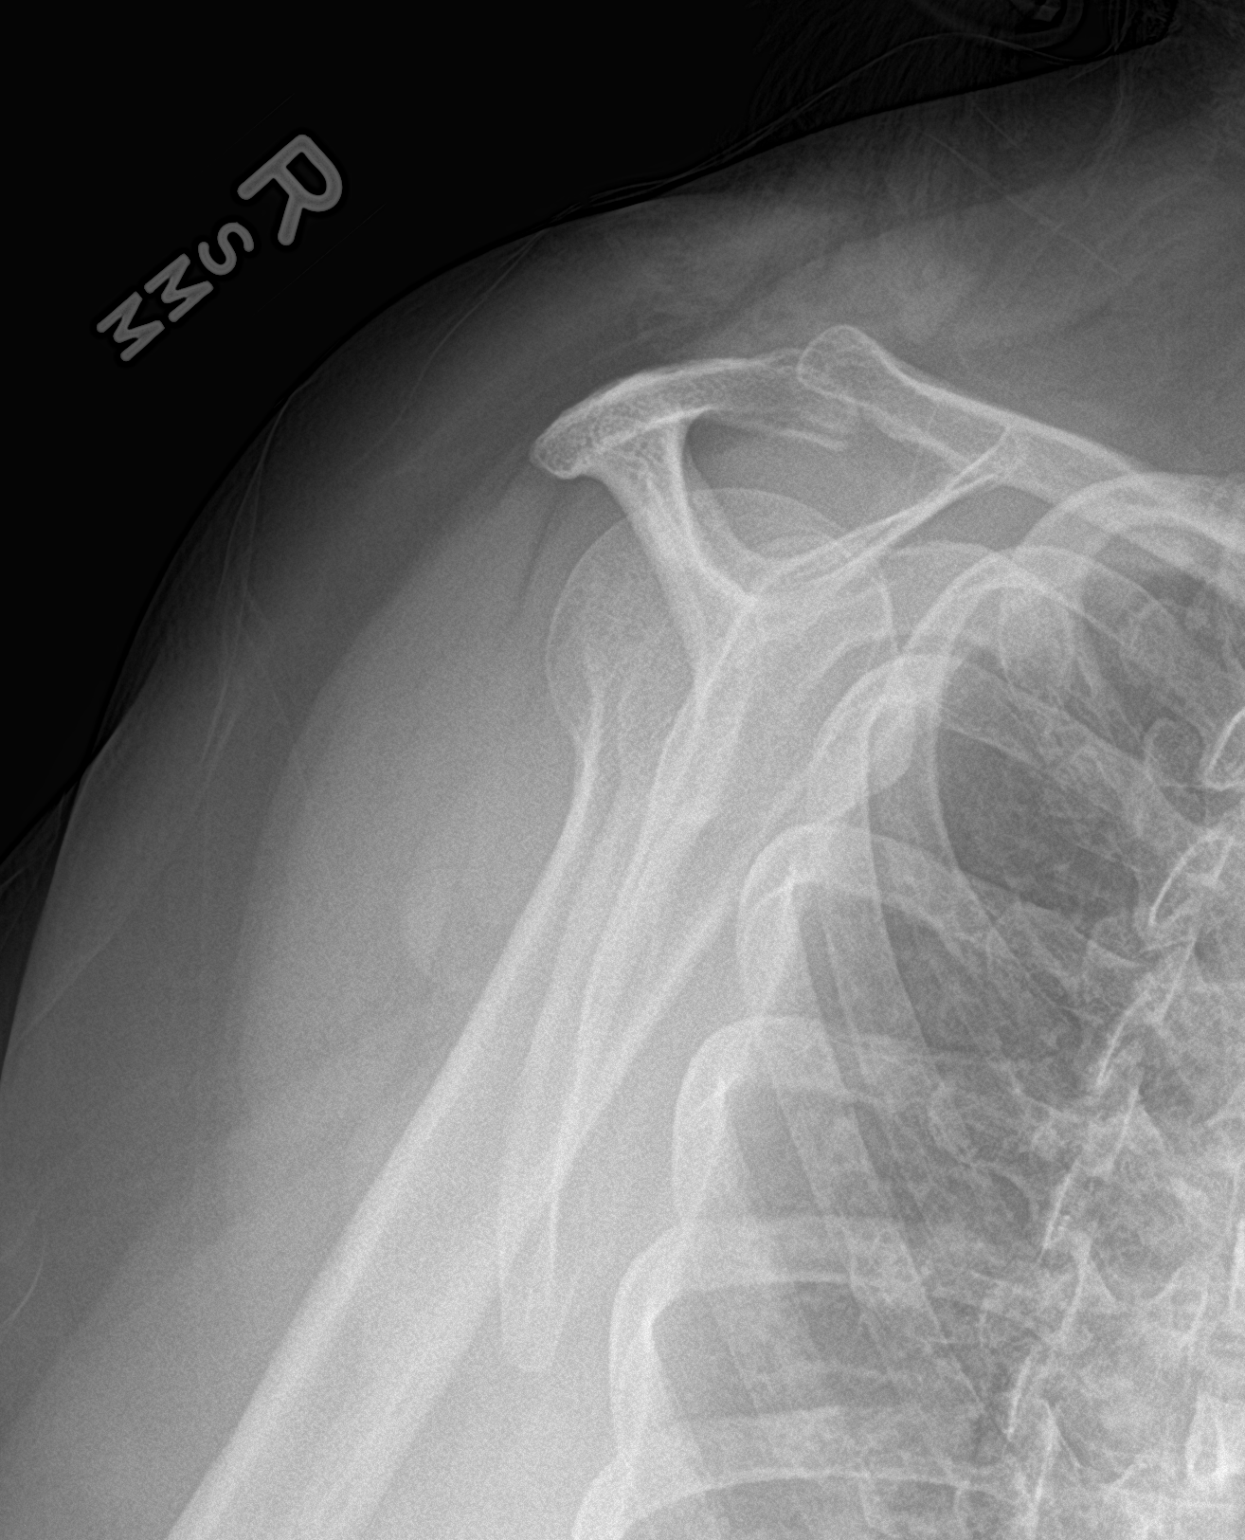

[3 of 3 positions shown; findings below may reference images not displayed]

FINDINGS: There is no evidence of fracture or dislocation. Normal glenohumeral
alignment. There is no evidence of arthropathy or other focal bone
abnormality. Soft tissues are unremarkable. No abnormal soft tissue
calcifications.
IMPRESSION: Negative radiographs of the right shoulder.

## 2018-06-18 IMAGING — CT CT ABD-PELV W/ CM
2 of 4 series · 16 of 46 positions shown, 18 images · IV contrast (Isovue)
Comparison: Renal ultrasound performed 09/03/2016, and CT of the
abdomen and pelvis performed 09/11/2015

CLINICAL DATA: Acute onset of epigastric abdominal pain and fever.
Initial encounter.

EXAM:
CT ABDOMEN AND PELVIS WITH CONTRAST
TECHNIQUE: Multidetector CT imaging of the abdomen and pelvis was performed
using the standard protocol following bolus administration of
intravenous contrast.
CONTRAST:  100mL [id number] IOPAMIDOL (0XM83Q-CYY) INJECTION 61%

[Series 2: axial st · axial · 0.95mm/px · z∈[+924,+1324]mm · 13 of 90 slices shown, 15 images]
[im 5/90  soft-tissue]
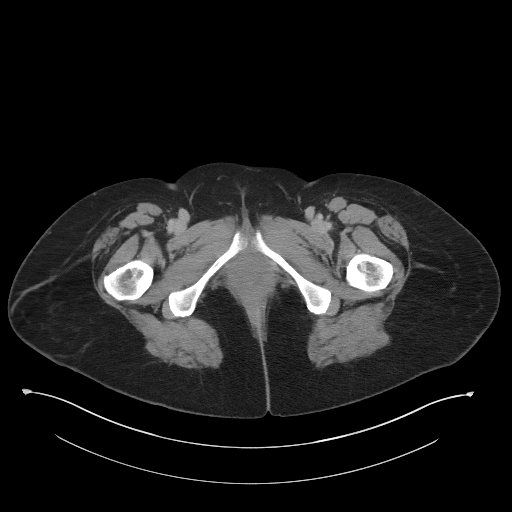
[im 5/90  bone]
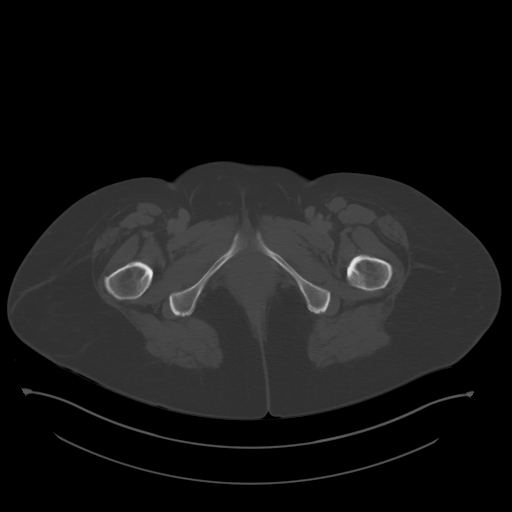
[im 13/90  soft-tissue]
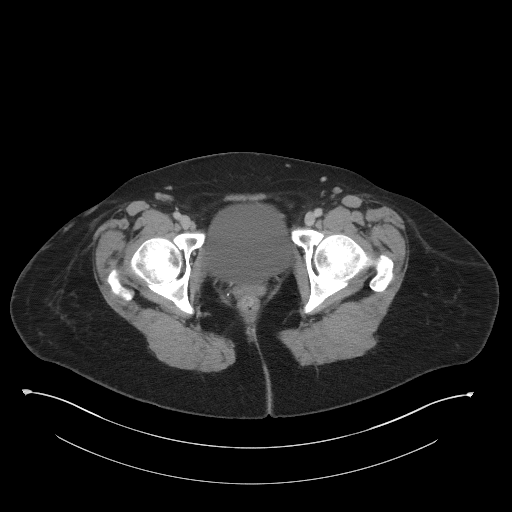
[im 17/90  soft-tissue]
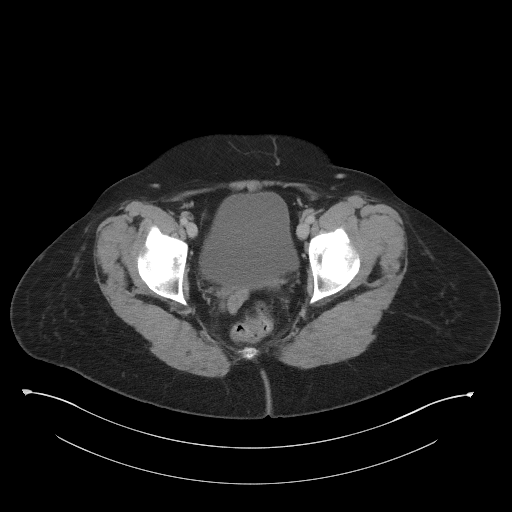
[im 26/90  soft-tissue]
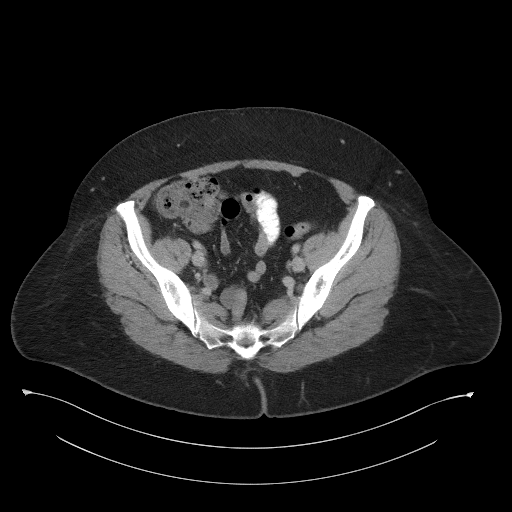
[im 30/90  soft-tissue]
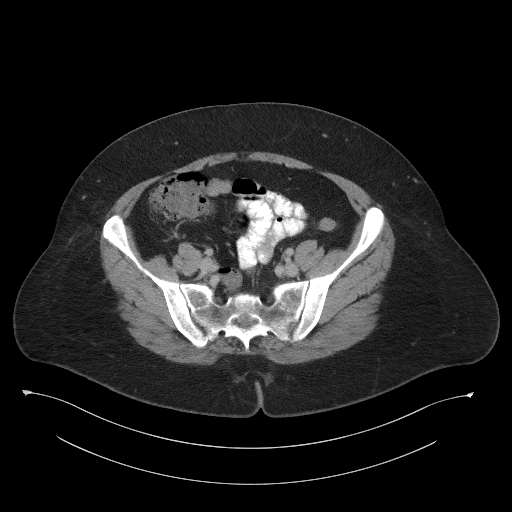
[im 39/90  soft-tissue]
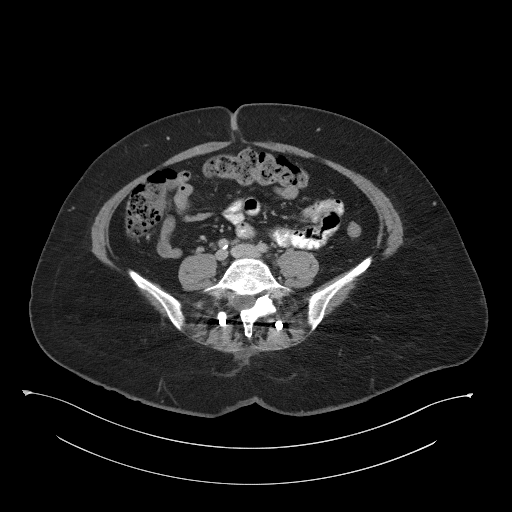
[im 47/90  soft-tissue]
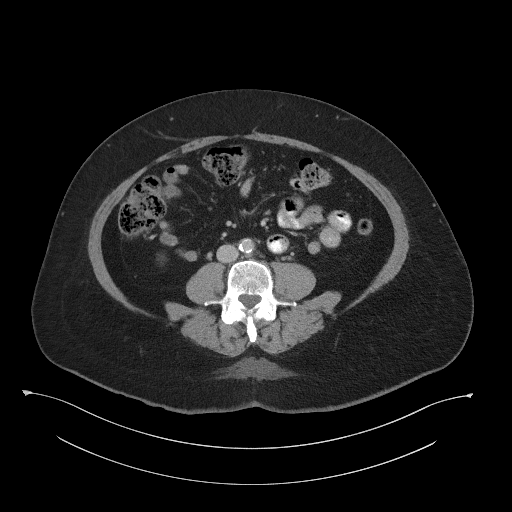
[im 51/90  soft-tissue]
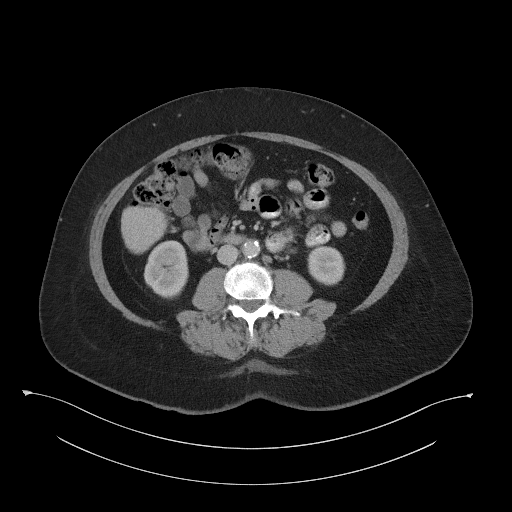
[im 60/90  soft-tissue]
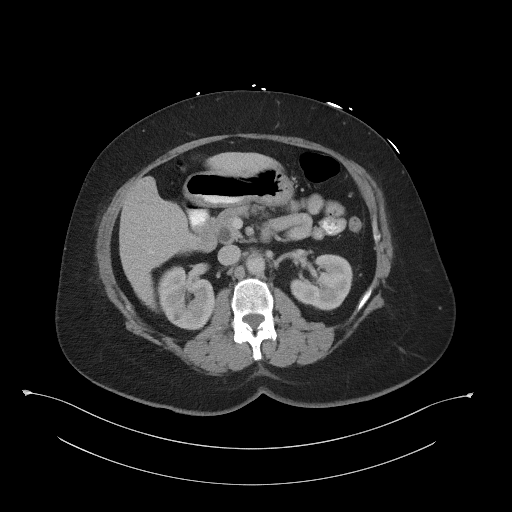
[im 60/90  bone]
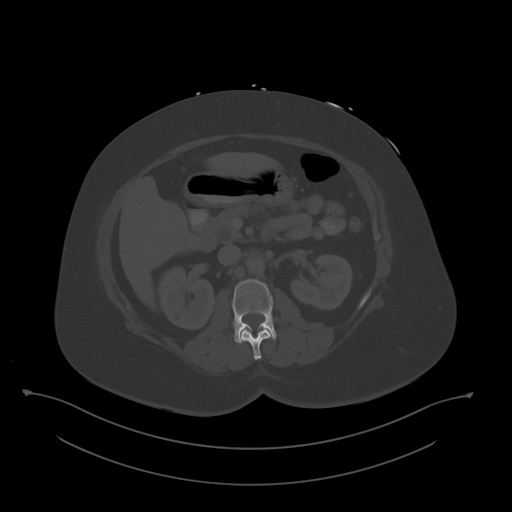
[im 64/90  soft-tissue]
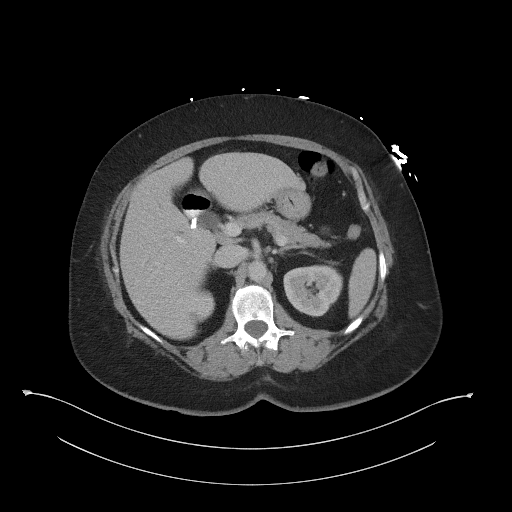
[im 73/90  soft-tissue]
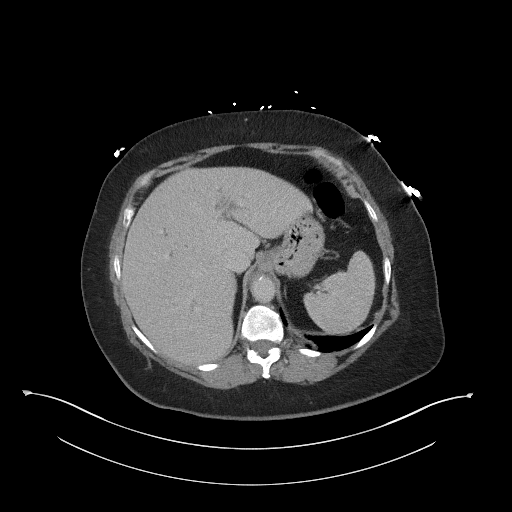
[im 77/90  soft-tissue]
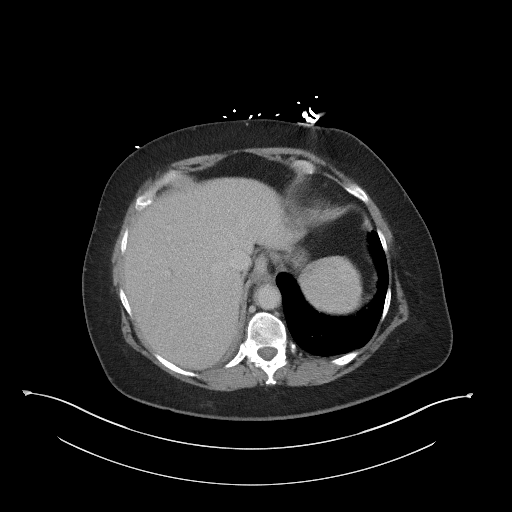
[im 85/90  soft-tissue]
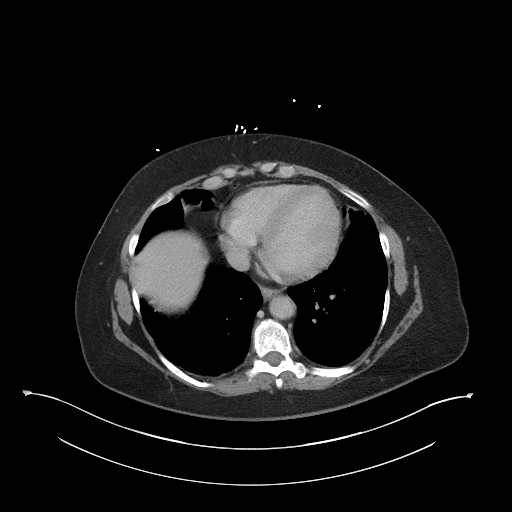

[Series 5: coronal st · coronal · 0.78mm/px · 3 of 96 slices shown]
[im 32/96  soft-tissue]
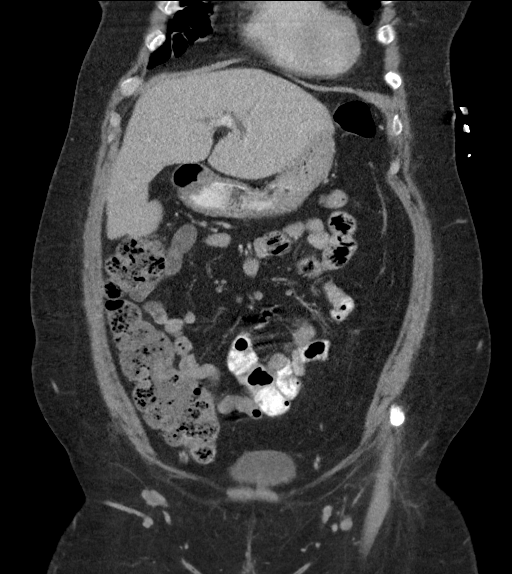
[im 43/96  soft-tissue]
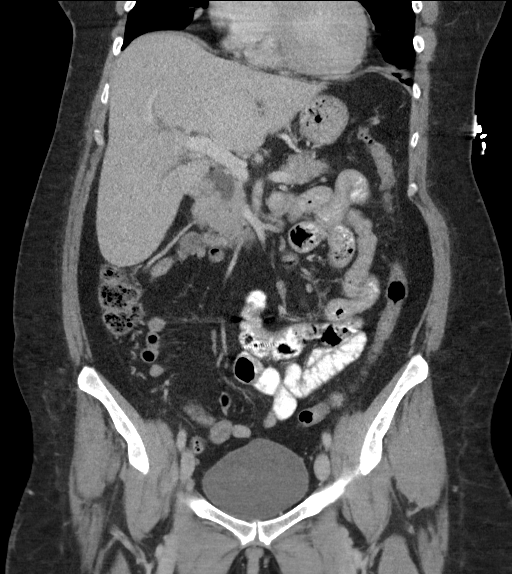
[im 53/96  soft-tissue]
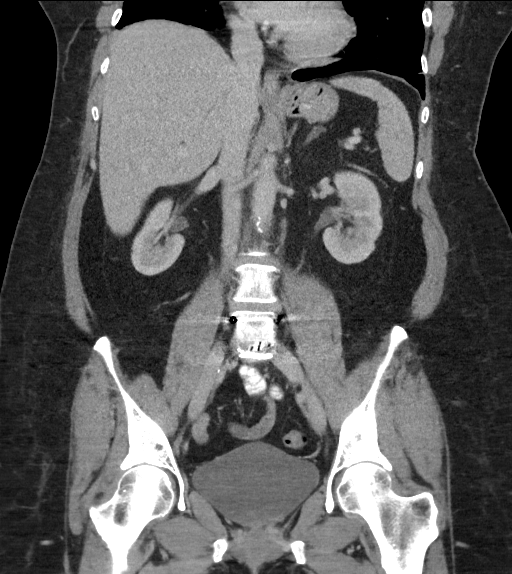

[16 of 46 positions shown; findings below may reference images not displayed]

FINDINGS: Lower chest: Mild bibasilar atelectasis or scarring is noted. A tiny
3 mm nodule at the left lung base is stable from 4052 and benign.
The visualized portions of the mediastinum are unremarkable.

Vague soft tissue edema is noted adjacent to the distal esophagus.
Endoscopy would be helpful to exclude underlying ulceration or
esophagitis.

Hepatobiliary: The liver is unremarkable in appearance. Intrahepatic
biliary ductal dilatation reflects prior cholecystectomy. Clips are
noted at the gallbladder fossa. The common bile duct remains normal
in caliber.

Pancreas: The pancreas is within normal limits.

Spleen: The spleen is unremarkable in appearance.

Adrenals/Urinary Tract: The adrenal glands are unremarkable in
appearance. The kidneys are within normal limits. There is no
evidence of hydronephrosis. No renal or ureteral stones are
identified. No perinephric stranding is seen.

Stomach/Bowel: The stomach is unremarkable in appearance. The small
bowel is within normal limits. The patient is status post
appendectomy. The colon is unremarkable in appearance.

Vascular/Lymphatic: Scattered calcification is seen along the
abdominal aorta and its branches. The abdominal aorta is otherwise
grossly unremarkable. The inferior vena cava is grossly
unremarkable. No retroperitoneal lymphadenopathy is seen. No pelvic
sidewall lymphadenopathy is identified.

Reproductive: The bladder is mildly distended and grossly
unremarkable. The patient is status post hysterectomy. No suspicious
adnexal masses are seen.

Other: No additional soft tissue abnormalities are seen.

Musculoskeletal: No acute osseous abnormalities are identified.
Lumbosacral spinal fusion hardware is noted at L5-S1. The visualized
musculature is unremarkable in appearance.
IMPRESSION: 1. Vague soft tissue edema noted adjacent to the distal esophagus.
Would correlate with the patient's symptoms. Endoscopy would be
helpful to exclude underlying ulceration or esophagitis.
2. Scattered aortic atherosclerosis.
3. Mild atelectasis or scarring noted at the lung bases.

## 2018-06-18 IMAGING — DX DG CHEST 2V
2 series · 2 of 2 positions shown · non-contrast
Comparison: Radiographs 09/08/2016, CT 08/03/2016

CLINICAL DATA: Upper abdominal pain. Nausea, vomiting and diarrhea.
Right arm pain.

EXAM:
CHEST  2 VIEW

[chest pa]
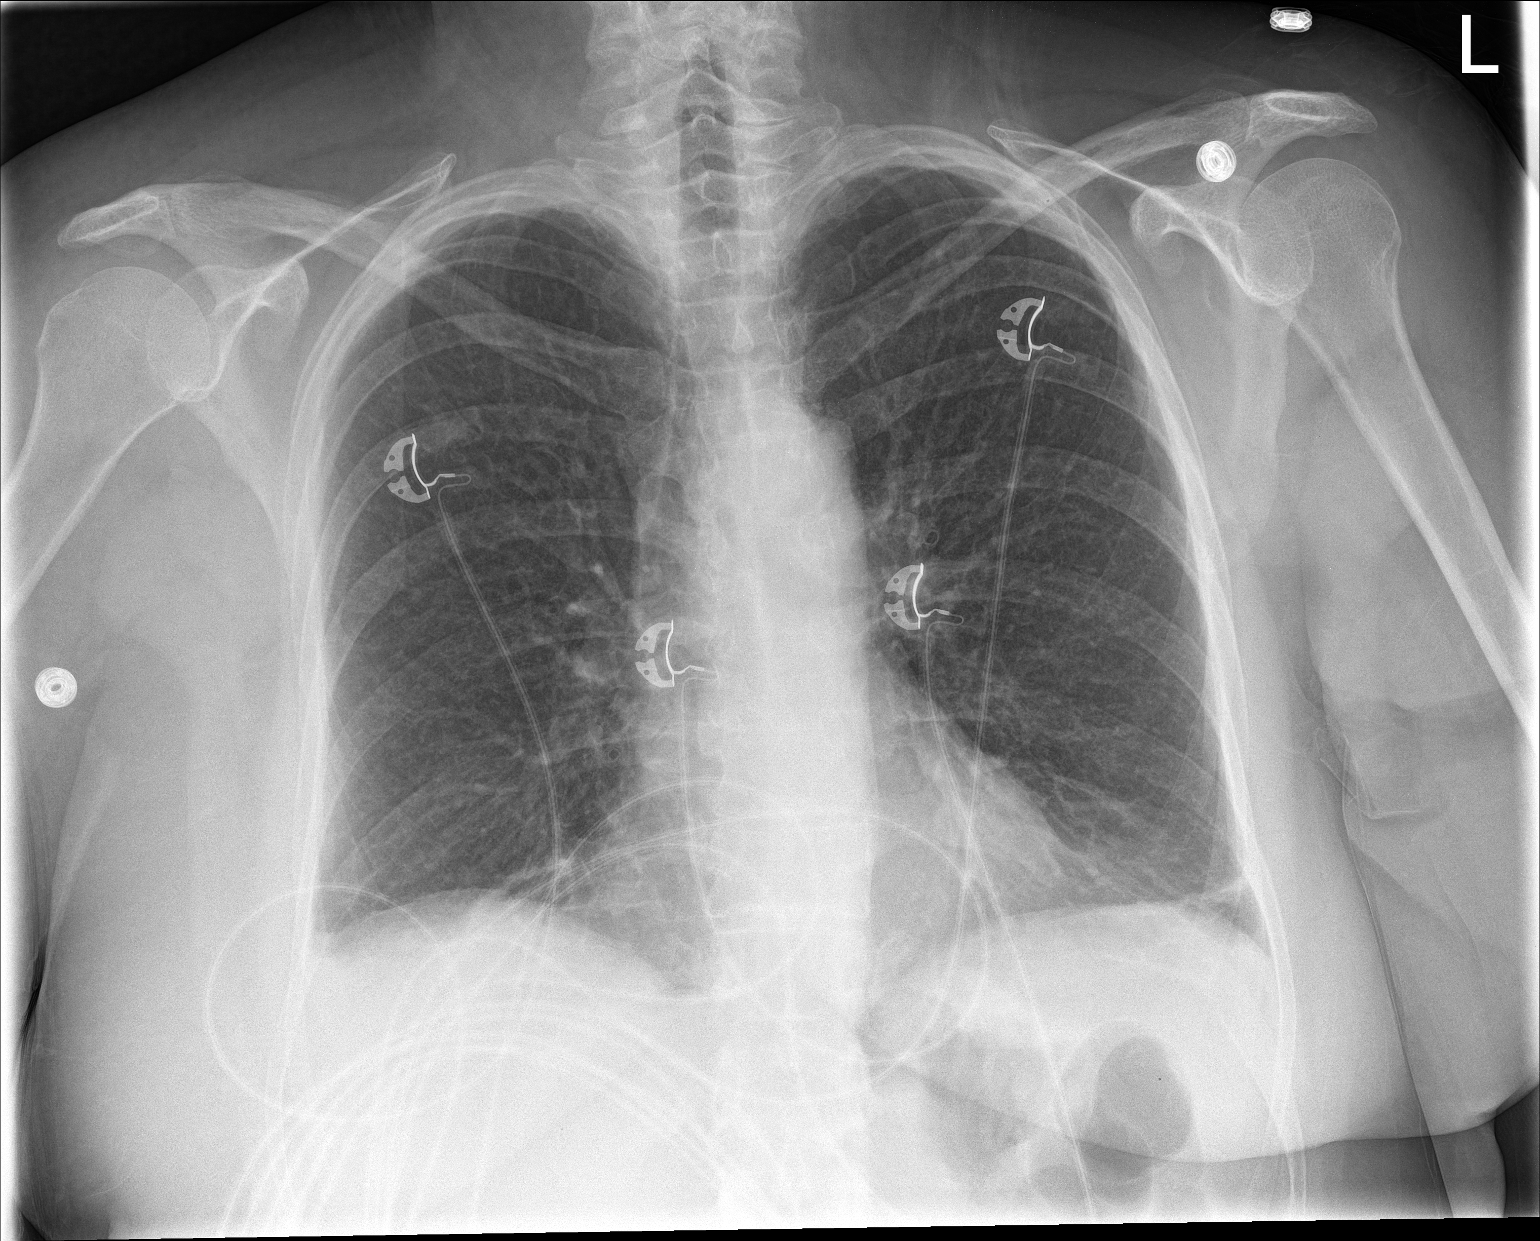

[chest lat]
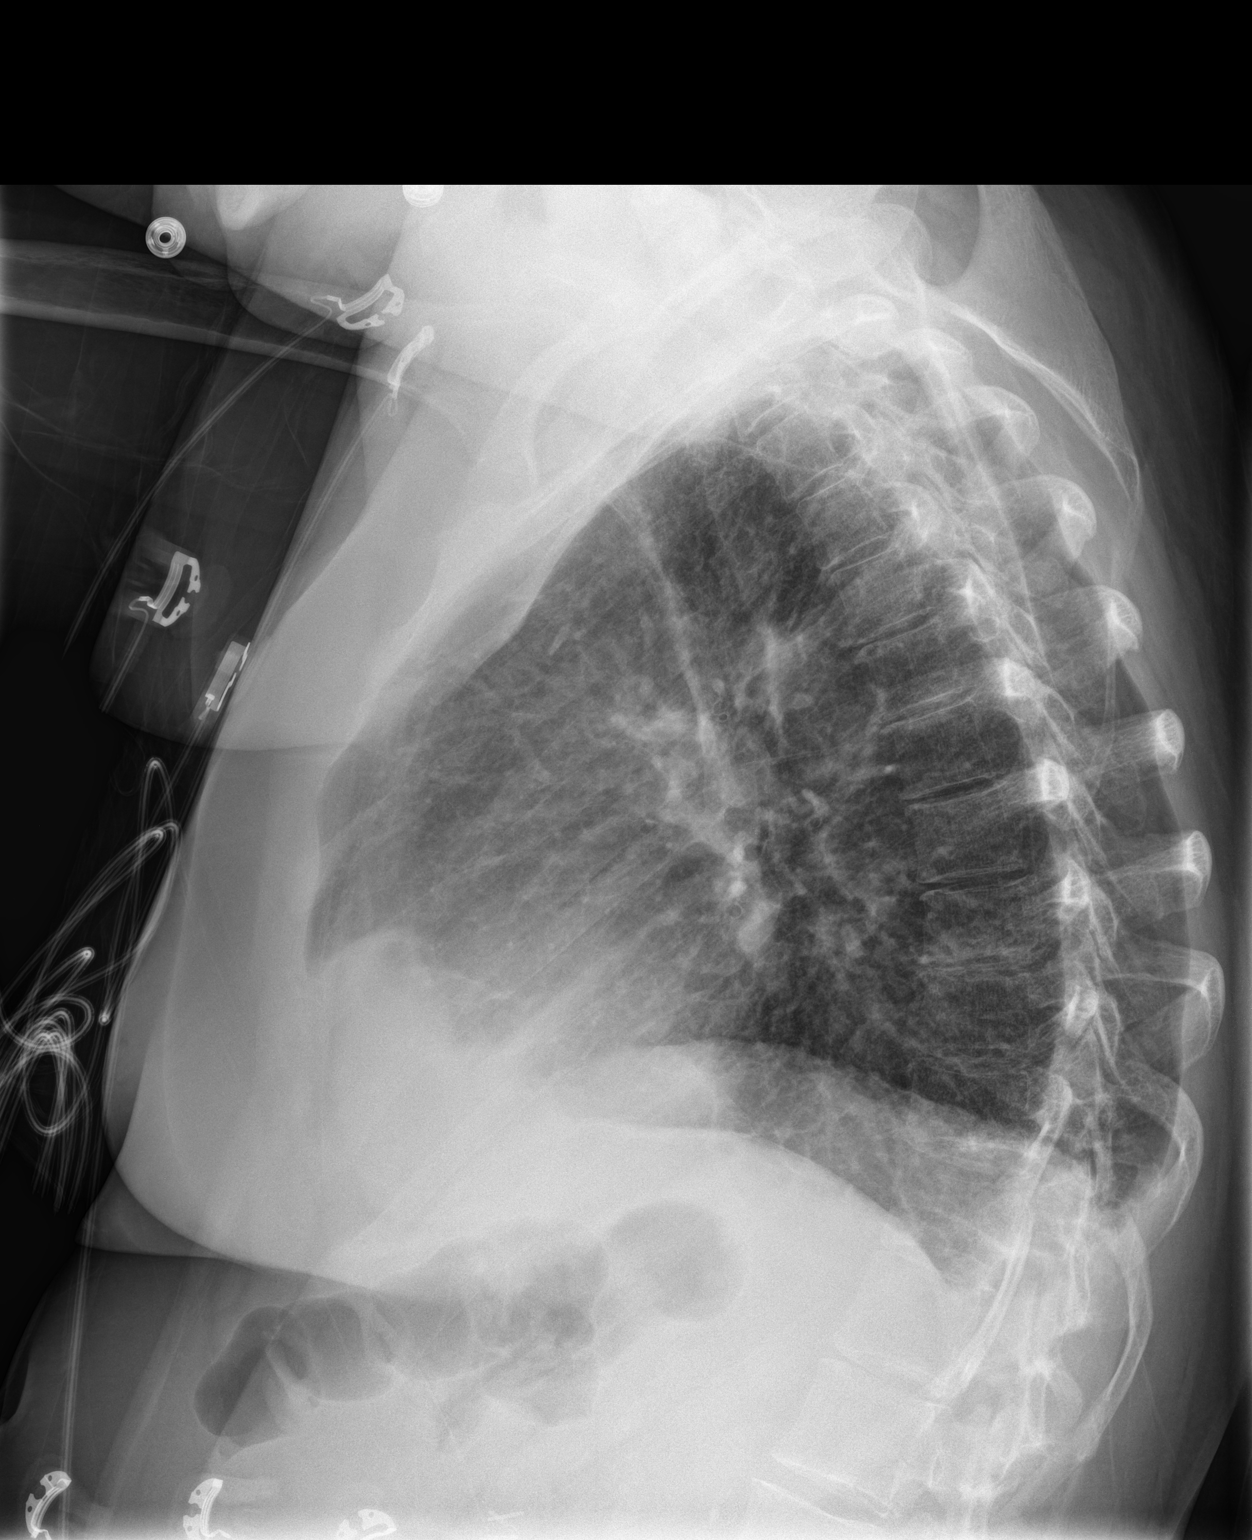

[2 of 2 positions shown; findings below may reference images not displayed]

FINDINGS: Normal heart size and mediastinal contours. Streaky atelectasis in
the lingula and right middle lobe. No pulmonary edema, confluent
airspace disease, pleural fluid or pneumothorax. No acute osseous
abnormalities are seen.
IMPRESSION: Streaky atelectasis in the lingula and right middle lobe.

## 2018-06-19 ENCOUNTER — Encounter: Payer: Self-pay | Admitting: Cardiovascular Disease

## 2018-06-19 ENCOUNTER — Emergency Department (HOSPITAL_COMMUNITY)
Admission: EM | Admit: 2018-06-19 | Discharge: 2018-06-19 | Discharge status: Against Medical Advice | Payer: Medicare Other | Attending: Emergency Medicine | Admitting: Emergency Medicine

## 2018-06-19 ENCOUNTER — Encounter (HOSPITAL_COMMUNITY): Payer: Self-pay | Admitting: Emergency Medicine

## 2018-06-19 ENCOUNTER — Ambulatory Visit (INDEPENDENT_AMBULATORY_CARE_PROVIDER_SITE_OTHER): Payer: Medicare Other | Admitting: Cardiovascular Disease

## 2018-06-19 ENCOUNTER — Emergency Department (HOSPITAL_COMMUNITY): Payer: Medicare Other

## 2018-06-19 VITALS — BP 232/110 | HR 86 | Ht 67.0 in | Wt 223.0 lb

## 2018-06-19 DIAGNOSIS — F17210 Nicotine dependence, cigarettes, uncomplicated: Secondary | ICD-10-CM | POA: Diagnosis not present

## 2018-06-19 DIAGNOSIS — I129 Hypertensive chronic kidney disease with stage 1 through stage 4 chronic kidney disease, or unspecified chronic kidney disease: Secondary | ICD-10-CM | POA: Diagnosis present

## 2018-06-19 DIAGNOSIS — I10 Essential (primary) hypertension: Secondary | ICD-10-CM | POA: Diagnosis not present

## 2018-06-19 DIAGNOSIS — E785 Hyperlipidemia, unspecified: Secondary | ICD-10-CM | POA: Insufficient documentation

## 2018-06-19 DIAGNOSIS — I160 Hypertensive urgency: Secondary | ICD-10-CM | POA: Diagnosis not present

## 2018-06-19 DIAGNOSIS — J449 Chronic obstructive pulmonary disease, unspecified: Secondary | ICD-10-CM | POA: Insufficient documentation

## 2018-06-19 DIAGNOSIS — J45909 Unspecified asthma, uncomplicated: Secondary | ICD-10-CM | POA: Diagnosis not present

## 2018-06-19 DIAGNOSIS — Z79899 Other long term (current) drug therapy: Secondary | ICD-10-CM | POA: Insufficient documentation

## 2018-06-19 DIAGNOSIS — N189 Chronic kidney disease, unspecified: Secondary | ICD-10-CM | POA: Diagnosis not present

## 2018-06-19 DIAGNOSIS — G44209 Tension-type headache, unspecified, not intractable: Secondary | ICD-10-CM | POA: Insufficient documentation

## 2018-06-19 DIAGNOSIS — R072 Precordial pain: Secondary | ICD-10-CM | POA: Diagnosis not present

## 2018-06-19 DIAGNOSIS — Z8673 Personal history of transient ischemic attack (TIA), and cerebral infarction without residual deficits: Secondary | ICD-10-CM | POA: Diagnosis not present

## 2018-06-19 LAB — CBC WITH DIFFERENTIAL/PLATELET
Abs Immature Granulocytes: 0.02 10*3/uL (ref 0.00–0.07)
Basophils Absolute: 0.1 10*3/uL (ref 0.0–0.1)
Basophils Relative: 1 %
Eosinophils Absolute: 0.2 10*3/uL (ref 0.0–0.5)
Eosinophils Relative: 2 %
HCT: 43 % (ref 36.0–46.0)
Hemoglobin: 13.5 g/dL (ref 12.0–15.0)
Immature Granulocytes: 0 %
Lymphocytes Relative: 42 %
Lymphs Abs: 4.3 10*3/uL — ABNORMAL HIGH (ref 0.7–4.0)
MCH: 29 pg (ref 26.0–34.0)
MCHC: 31.4 g/dL (ref 30.0–36.0)
MCV: 92.5 fL (ref 80.0–100.0)
MONOS PCT: 6 %
Monocytes Absolute: 0.6 10*3/uL (ref 0.1–1.0)
NEUTROS PCT: 49 %
Neutro Abs: 5.1 10*3/uL (ref 1.7–7.7)
Platelets: 268 10*3/uL (ref 150–400)
RBC: 4.65 MIL/uL (ref 3.87–5.11)
RDW: 14.3 % (ref 11.5–15.5)
WBC: 10.3 10*3/uL (ref 4.0–10.5)
nRBC: 0 % (ref 0.0–0.2)

## 2018-06-19 LAB — COMPREHENSIVE METABOLIC PANEL
ALT: 15 U/L (ref 0–44)
AST: 15 U/L (ref 15–41)
Albumin: 3.9 g/dL (ref 3.5–5.0)
Alkaline Phosphatase: 69 U/L (ref 38–126)
Anion gap: 9 (ref 5–15)
BUN: 15 mg/dL (ref 6–20)
CO2: 24 mmol/L (ref 22–32)
Calcium: 8.9 mg/dL (ref 8.9–10.3)
Chloride: 107 mmol/L (ref 98–111)
Creatinine, Ser: 0.73 mg/dL (ref 0.44–1.00)
GFR calc Af Amer: >60 mL/min (ref >60)
GFR calc non Af Amer: >60 mL/min (ref >60)
GLUCOSE: 86 mg/dL (ref 70–99)
Potassium: 3.3 mmol/L — ABNORMAL LOW (ref 3.5–5.1)
Sodium: 140 mmol/L (ref 135–145)
TOTAL PROTEIN: 7.1 g/dL (ref 6.5–8.1)
Total Bilirubin: 0.3 mg/dL (ref 0.3–1.2)

## 2018-06-19 LAB — TROPONIN I: Troponin I: <0.03 ng/mL (ref <0.03)

## 2018-06-19 MED ORDER — LABETALOL HCL 100 MG PO TABS: 100.0000 mg | ORAL_TABLET | Freq: Two times a day (BID) | ORAL | 3 refills | Status: DC

## 2018-06-19 MED ORDER — METOCLOPRAMIDE HCL 5 MG/ML IJ SOLN
10.0000 mg | Freq: Once | INTRAMUSCULAR | Status: AC
  Administered 2018-06-19: 10 mg via INTRAVENOUS
  Filled 2018-06-19: qty 2

## 2018-06-19 MED ORDER — HYDRALAZINE HCL 20 MG/ML IJ SOLN
10.0000 mg | Freq: Once | INTRAMUSCULAR | Status: AC
  Administered 2018-06-19: 10 mg via INTRAVENOUS
  Filled 2018-06-19: qty 1

## 2018-06-19 MED ORDER — KETOROLAC TROMETHAMINE 30 MG/ML IJ SOLN
30.0000 mg | Freq: Once | INTRAMUSCULAR | Status: AC
  Administered 2018-06-19: 30 mg via INTRAVENOUS
  Filled 2018-06-19: qty 1

## 2018-06-19 MED ORDER — SPIRONOLACTONE 25 MG PO TABS: 25.0000 mg | ORAL_TABLET | Freq: Every day | ORAL | 3 refills | Status: DC

## 2018-06-19 NOTE — ED Triage Notes (Signed)
Pt sent from Dr. Court Joy office for hypertension.  Was there today for follow up from admission for malignant hypertension.  Unclear from notes if pt is compliant with meds, however she states she is.  Only c/o headache.

## 2018-06-19 NOTE — ED Notes (Signed)
Per cardiology, they prescribed labetalol 100mg  bid and spironolactone 25mg  daily today.

## 2018-06-19 NOTE — ED Notes (Signed)
Pt refused blood work and stated that she is leaving. Iv removed and AMA signed.

## 2018-06-19 NOTE — Patient Instructions (Addendum)
Medication Instructions:  START Labetalol 100 mg twice a day  START Spironolactone 25 mg daily  If you need a refill on your cardiac medications before your next appointment, please call your pharmacy.   Lab work: [employer] on Thursday  06/22/2018 If you have labs (blood work) drawn today and your tests are completely normal, you will receive your results only by: Marland Kitchen MyChart Message (if you have MyChart) OR . A paper copy in the mail If you have any lab test that is abnormal or we need to change your treatment, we will call you to review the results.  Testing/Procedures: None today  Follow-Up: At [facility], you and your health needs are our priority.  As part of our continuing mission to provide you with exceptional heart care, we have created designated Provider Care Teams.  These Care Teams include your primary Cardiologist (physician) and Advanced Practice Providers (APPs -  Physician Assistants and Nurse Practitioners) who all work together to provide you with the care you need, when you need it. You will need a follow up appointment in 1 months.  Please call our office 2 months in advance to schedule this appointment.  You may see Kate Sable, MD or one of the following Advanced Practice Providers on your designated Care Team:   Bernerd Pho, PA-C [facility]) . Ermalinda Barrios, PA-C ([place])  Any Other Special Instructions Will Be Listed Below (If Applicable). None    Report given to [facility] in ED, transport via [facility]

## 2018-06-19 NOTE — Progress Notes (Signed)
SUBJECTIVE: The patient presents for follow-up of malignant hypertension.  She has a history of stroke. Carotid Dopplers on 02/07/2018 showed mild bilateral internal carotid artery stenosis.  Coronary CT angiogram on 03/22/2018 showed small area of calcified plaque in the mid RCA but no significant stenosis.  There was no plaque or stenosis in the remaining epicardial vessels.  She was hospitalized in the fall 2019 with hypertensive urgency secondary to noncompliance.  Psychiatry was consulted during the hospitalization due to her noncooperative behavior and she was felt to be at high risk for readmission because of adjustment disorder with emotional disturbance and medical noncompliance.  She was again evaluated for chest pain in the ED on 05/02/2018.  Blood pressure was 210/88.  Blood pressure is 232/110.  BUN 18, creatinine 0.82 on 05/01/2018.  She told me she was recently in [place] and blood pressure was 230/140 and she had a severe headache.  She told me she was told to take ibuprofen and Tylenol.  She is out of clonidine.  She has had hand and feet swelling.  She often misses the dose of hydralazine in the afternoon but does take it twice daily.  She also takes amlodipine 10 mg daily.     Review of Systems: As per "subjective", otherwise negative.  Allergies  Allergen Reactions  . Other Anaphylaxis and Swelling    [place]  . Penicillins Anaphylaxis    Has patient had a PCN reaction causing immediate rash, facial/tongue/throat swelling, SOB or lightheadedness with hypotension: Yes Has patient had a PCN reaction causing severe rash involving mucus membranes or skin necrosis: Yes Has patient had a PCN reaction that required hospitalization Yes Has patient had a PCN reaction occurring within the last 10 years: No If all of the above answers are "NO", then may proceed with Cephalosporin use.   . Zithromax [Azithromycin] Anaphylaxis  . Aspirin Other (See Comments)     Due to stomach ulcers.   . Pineapple Rash  . Strawberry Extract Rash and Hives  . Aspartame And Phenylalanine Palpitations  . Mushroom Extract Complex Rash  . Nicardipine Nausea And Vomiting and Other (See Comments)    shaking    Current Outpatient Medications  Medication Sig Dispense Refill  . amLODipine (NORVASC) 10 MG tablet Take 1 tablet (10 mg total) by mouth daily. 30 tablet 6  . aspirin-acetaminophen-caffeine (EXCEDRIN EXTRA STRENGTH) 250-250-65 MG tablet Take 2 tablets by mouth daily as needed for headache or migraine.     . clopidogrel (PLAVIX) 75 MG tablet Take 75 mg by mouth daily.     Marland Kitchen dexlansoprazole (DEXILANT) 60 MG capsule Take 1 tab by mouth every morning. (Patient taking differently: Take 60 mg by mouth every evening. ) 30 capsule 1  . EPINEPHrine (EPIPEN 2-PAK) 0.3 mg/0.3 mL IJ SOAJ injection Inject into the muscle.    . gabapentin (NEURONTIN) 600 MG tablet Take 600 mg by mouth 4 (four) times daily.    . hydrALAZINE (APRESOLINE) 100 MG tablet Take 1 tablet (100 mg total) by mouth 3 (three) times daily. 90 tablet 6  . rOPINIRole (REQUIP) 4 MG tablet Take 4 mg by mouth at bedtime.    Marland Kitchen zolpidem (AMBIEN CR) 12.5 MG CR tablet Take 12.5 mg by mouth at bedtime.     No current facility-administered medications for this visit.     Past Medical History:  Diagnosis Date  . Allergy   . Anemia 1975-1976   . Anxiety    takes Valium daily  as needed  . Arthritis    "spine" (12/03/2016)  . Asthma    has inhalers but doesn't use (12/03/2016)  . Chronic bronchitis ([place])    "get it a couple times q yr" (12/03/2016)  . Chronic kidney disease   . Chronic lower back pain    budlging disc   . Claustrophobia   . COPD (chronic obstructive pulmonary disease) ([place])   . Daily headache   . Depression   . Diverticulitis   . Family history of adverse reaction to anesthesia    2 daughters gets extremely sick   . GERD (gastroesophageal reflux disease)    takes Dexilant daily  .  Heart murmur   . History of blood transfusion 1975-1976 "several"   no abnormal reaction noted  . History of colitis   . History of colon polyps    benign  . History of gastric ulcer   . History of hiatal hernia    "small one"  . History of MRSA infection 2017  . Hyperlipidemia    was on meds but has been off over a yr  . Hypertension    takes Amlodipine and Maxzide daily  . Insomnia    takes Ambien nightly  . Iron deficiency anemia    "when I was young"  . Lung nodules   . Migraine    "2-3/wk" (12/03/2016)  . MS (multiple sclerosis) ([place])    questionable per pt  . Noncompliance   . Osteoporosis   . Peripheral neuropathy    weakness,numbness,and tingling. Takes Gabapentin daily  . PTSD (post-traumatic stress disorder) dx'd 2016/2017   "was dx'd w/bipolar in 1991; replaced w/PTSD dx 2016/2017" (12/03/2016)  . Restless leg syndrome    takes Requip daily  . 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 Type II diabetes mellitus ([place])    "went off insulin in 2012/2013" (12/03/2016)    Past Surgical History:  Procedure Laterality Date  . ABDOMINAL AORTOGRAM N/A 12/03/2016   Procedure: Abdominal Aortogram;  Surgeon: Angelia Mould, MD;  Location: [facility];  Service: Cardiovascular;  Laterality: N/A;  . ABDOMINAL HYSTERECTOMY  12/1987  . ADENOIDECTOMY  1975  . ANKLE SURGERY Bilateral 1993; 1995 X2   "stabilzation done; 1 on the right; 2 on the left"  . APPENDECTOMY  1989  . BREAST EXCISIONAL BIOPSY Left   . BREAST LUMPECTOMY Left    "benign tumor"  . CARDIAC CATHETERIZATION N/A 01/31/2015   Procedure: Left Heart Cath and Coronary Angiography;  Surgeon: Burnell Blanks, MD;  Location: [place];  Service: Cardiovascular;  Laterality: N/A;  . COLONOSCOPY    . DILATION AND CURETTAGE OF UTERUS    . ESOPHAGOGASTRODUODENOSCOPY    . LAPAROSCOPIC CHOLECYSTECTOMY  1996  .  NASAL RECONSTRUCTION  1976  . NASAL SINUS SURGERY  1975  . POSTERIOR LUMBAR FUSION  2005  . RADIOLOGY WITH ANESTHESIA N/A 01/15/2016   Procedure: MRI LUMBAR SPINE WITHOUT;  Surgeon: Medication Radiologist, MD;  Location: [facility];  Service: Radiology;  Laterality: N/A;  . RADIOLOGY WITH ANESTHESIA N/A 06/29/2016   Procedure: MRI OF THE BRAIN WITH AND WITHOUT;  Surgeon: Medication Radiologist, MD;  Location: [facility];  Service: Radiology;  Laterality: N/A;  . RENAL ANGIOGRAPHY N/A 12/03/2016   Procedure: Renal Angiography;  Surgeon: Angelia Mould, MD;  Location: [facility];  Service: Cardiovascular;  Laterality: N/A;  . TONSILLECTOMY  [place]    . 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 Marital status: Divorced    Spouse name: Not on file  . Number of children: 3  . Years of education: Some college  . Highest education level: Not on file  Occupational History    Comment: Disability for back pain  Social Needs  . Financial resource strain: Not on file  . Food insecurity:    Worry: Not on file    Inability: Not on file  . Transportation needs:    Medical: Not on file    Non-medical: Not on file  Tobacco Use  . Smoking status: Current Every Day Smoker    Packs/day: 1.00    Years: 45.00    Pack years: 45.00    Types: Cigarettes    Start date: 03/04/1971  . Smokeless tobacco: Never Used  Substance and Sexual Activity  . Alcohol use: No    Alcohol/week: 0.0 standard drinks  . Drug use: No  . Sexual activity: Never    Birth control/protection: Surgical  Lifestyle  . Physical activity:    Days per week: Not on file    Minutes per session: Not on file  . Stress: Not on file  Relationships  . Social connections:    Talks on phone: Not on file    Gets together: Not on file    Attends religious service: Not on file    Active member of club or  organization: Not on file    Attends meetings of clubs or organizations: Not on file    Relationship status: Not on file  . Intimate partner violence:    Fear of current or ex partner: Not on file    Emotionally abused: Not on file    Physically abused: Not on file    Forced sexual activity: Not on file  Other Topics Concern  . Not on file  Social History Narrative   Lives at home with her daughter.   Right-handed.   1-2 cups coffee in the morning and 2 sodas per day.     Vitals:   06/19/18 1550  BP: (!) 232/110  Pulse: 86  SpO2: 95%  Weight: 223 lb (101.2 kg)  Height: 5\' 7"  (1.702 m)    Wt Readings from Last 3 Encounters:  06/19/18 223 lb (101.2 kg)  05/11/18 224 lb (101.6 kg)  04/05/18 221 lb (100.2 kg)     PHYSICAL EXAM General: NAD HEENT: Normal. Neck: No JVD, no thyromegaly. Lungs: Clear to auscultation bilaterally with normal respiratory effort. CV: Regular rate and rhythm, normal S1/S2, no S3/S4, no murmur. No pretibial or periankle edema.   Abdomen: Soft, nontender, no distention.  Neurologic: Alert and oriented.  Psych: Normal affect. Skin: Normal. Musculoskeletal: No gross deformities.    ECG: Reviewed above under Subjective   Labs: Lab Results  Component Value Date/Time   K 3.7 05/01/2018 10:48 PM   BUN 18 05/01/2018 10:48 PM   CREATININE 0.82 05/01/2018 10:48 PM   CREATININE 0.92 10/25/2017 01:28 PM   ALT 15 02/06/2018 12:29 AM   TSH 6.752 (H) 05/01/2018 11:28 PM   TSH 3.630 03/20/2014 12:37 AM   HGB 13.5 05/11/2018 12:31 PM     Lipids: Lab Results  Component Value Date/Time   LDLCALC 82 08/21/2017 05:25 AM   CHOL 140 08/21/2017 05:25  AM   TRIG 142 08/21/2017 05:25 AM   HDL 30 (L) 08/21/2017 05:25 AM       ASSESSMENT AND PLAN: 1.  Malignant hypertension with endorgan damage (history of stroke)/hypertensive urgency: Blood pressure is significantly elevated.  She was hospitalized in the fall 2019 hypertensive emergency due to  medication and dietary noncompliance. She always claims to be compliant with medications.  As stated above,  Psychiatry was consulted during hospitalization in the fall 2019 due to her noncooperative behavior and she was felt to be at high risk for readmission because of adjustment disorder with emotional disturbance and medical noncompliance. She is currently taking hydralazine 100 mg twice daily and amlodipine 10 mg daily.  I have instructed her to go to the ED as she needs acute blood pressure reduction. I will prescribe spironolactone 25 mg daily and labetalol 100 mg twice daily.  I will check a basic metabolic panel within the next several days.   2.  History of CVA: She is on Plavix.  Blood pressure is severely elevated.  She needs acute blood pressure reduction.  I have instructed her to go to the ED.    Disposition: Follow up 1 month   A high level of decision making was required for increased medical complexities.   Kate Sable, M.D., F.A.C.C.

## 2018-06-19 NOTE — ED Provider Notes (Signed)
Emergency Department Provider Note   I have reviewed the triage vital signs and the nursing notes.   HISTORY  Chief Complaint Hypertension   HPI Katrina [name] is a 58 y.o. female with PMH if asthma, CKD, COPD, HLD, HTN, DM, HTN emergency, and prior CVA with RUE weakness presents to the emergency department from Dr. Court Joy office with severe HTN.  Patient states she went there today for follow-up appointment after recent admission for hypertension urgency.  She has been compliant with her medications but continues to have severely elevated blood pressures.  The patient describes a "bad headache" along with face numbness and chest pain.  Patient states that she told her cardiologist about the headache but did not mention the chest pain, shortness of breath, face numbness.  She states that the shortness of breath is not significantly worse from her baseline but that the chest pain is new since yesterday.  Her headache is typical of headaches that she has had in the past but is more severe today.  No radiation of symptoms or other modifying factors.   Past Medical History:  Diagnosis Date  . Allergy   . Anemia 1975-1976   . Anxiety    takes Valium daily as needed  . Arthritis    "spine" (12/03/2016)  . Asthma    has inhalers but doesn't use (12/03/2016)  . Chronic bronchitis ([place])    "get it a couple times q yr" (12/03/2016)  . Chronic kidney disease   . Chronic lower back pain    budlging disc   . Claustrophobia   . COPD (chronic obstructive pulmonary disease) ([place])   . Daily headache   . Depression   . Diverticulitis   . Family history of adverse reaction to anesthesia    2 daughters gets extremely sick   . GERD (gastroesophageal reflux disease)    takes Dexilant daily  . Heart murmur   . History of blood transfusion 1975-1976 "several"   no abnormal reaction noted  . History of colitis   . History of colon polyps    benign  . History of gastric ulcer   . History  of hiatal hernia    "small one"  . History of MRSA infection 2017  . Hyperlipidemia    was on meds but has been off over a yr  . Hypertension    takes Amlodipine and Maxzide daily  . Insomnia    takes Ambien nightly  . Iron deficiency anemia    "when I was young"  . Lung nodules   . Migraine    "2-3/wk" (12/03/2016)  . MS (multiple sclerosis) ([place])    questionable per pt  . Noncompliance   . Osteoporosis   . Peripheral neuropathy    weakness,numbness,and tingling. Takes Gabapentin daily  . PTSD (post-traumatic stress disorder) dx'd 2016/2017   "was dx'd w/bipolar in 1991; replaced w/PTSD dx 2016/2017" (12/03/2016)  . Restless leg syndrome    takes Requip daily  . 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 Type II diabetes mellitus ([place])    "went off insulin in 2012/2013" (12/03/2016)    Patient Active Problem List   Diagnosis Date Noted  . 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 Impaired insight 02/08/2018  . Adjustment disorder with disturbance of emotion 02/08/2018  . Left lower quadrant abdominal pain affecting pregnancy 12/21/2017  . Chest pain in adult  08/20/2017  . Abnormal brain MRI 08/08/2017  . Weakness of left leg 05/19/2017  . Hypertension 12/03/2016  . Adverse food reaction 10/14/2016  . Pulmonary nodules 10/14/2016  . Non-seasonal allergic rhinitis due to fungal spores 10/14/2016  . Cigarette smoker 09/24/2016  . Cerebral microvascular disease 08/19/2016  . Non compliance with medical treatment 05/11/2016  . Leg pain, anterior, right 05/11/2016  . Syncope 05/02/2016  . Type 2 diabetes mellitus with diabetic neuropathy ([place]) 05/02/2016  . Restless leg syndrome 05/02/2016  . History of CVA with residual deficit   . Vascular headache   . History of chest pain   . Generalized anxiety disorder   . Chronic bilateral low back pain without sciatica   .  Peripheral neuropathy   . Migraine without status migrainosus, not intractable   . PTSD (post-traumatic stress disorder)   . History of cerebrovascular accident (CVA) with residual deficit   . CVA (cerebral vascular accident) ([place]) 04/03/2016  . Stroke (cerebrum) ([place]) 04/03/2016  . Post traumatic stress disorder 10/06/2015  . Precordial pain   . Migraine syndrome 01/24/2015  . Migraine 01/24/2015  . TIA (transient ischemic attack) 12/25/2014  . Hypertensive urgency   . Atypical chest pain   . Malignant hypertension   . Hypokalemia 04/16/2014  . Muscle weakness (generalized) 04/05/2014  . Stiffness of left hip joint 04/05/2014  . Pain in left hip 04/05/2014  . Diastolic dysfunction 82/99/3716  . Normal coronary arteries 03/20/2014  . PUD (peptic ulcer disease) 03/20/2014  . Type 2 diabetes, uncontrolled, with neuropathy ([place]) 03/20/2014  . Morbid obesity due to excess calories ([place]) 03/20/2014  . Left-sided weakness 03/20/2014  . Paresthesias 03/20/2014  . Cerebrovascular disease 03/20/2014  . Dyspnea on exertion 03/05/2012  . Tobacco abuse 03/05/2012  . Moderate COPD (chronic obstructive pulmonary disease) ([place]) 11/23/2011  . GERD (gastroesophageal reflux disease) 11/23/2011  . Uncontrolled hypertension 11/23/2011    Past Surgical History:  Procedure Laterality Date  . ABDOMINAL AORTOGRAM N/A 12/03/2016   Procedure: Abdominal Aortogram;  Surgeon: Angelia Mould, MD;  Location: [facility];  Service: Cardiovascular;  Laterality: N/A;  . ABDOMINAL HYSTERECTOMY  12/1987  . ADENOIDECTOMY  1975  . ANKLE SURGERY Bilateral 1993; 1995 X2   "stabilzation done; 1 on the right; 2 on the left"  . APPENDECTOMY  1989  . BREAST EXCISIONAL BIOPSY Left   . BREAST LUMPECTOMY Left    "benign tumor"  . CARDIAC CATHETERIZATION N/A 01/31/2015   Procedure: Left Heart Cath and Coronary Angiography;  Surgeon: Burnell Blanks, MD;  Location: [facility];  Service:  Cardiovascular;  Laterality: N/A;  . COLONOSCOPY    . DILATION AND CURETTAGE OF UTERUS    . ESOPHAGOGASTRODUODENOSCOPY    . LAPAROSCOPIC CHOLECYSTECTOMY  1996  . NASAL RECONSTRUCTION  1976  . NASAL SINUS SURGERY  1975  . POSTERIOR LUMBAR FUSION  2005  . RADIOLOGY WITH ANESTHESIA N/A 01/15/2016   Procedure: MRI LUMBAR SPINE WITHOUT;  Surgeon: Medication Radiologist, MD;  Location: [facility];  Service: Radiology;  Laterality: N/A;  . RADIOLOGY WITH ANESTHESIA N/A 06/29/2016   Procedure: MRI OF THE BRAIN WITH AND WITHOUT;  Surgeon: Medication Radiologist, MD;  Location: [facility];  Service: Radiology;  Laterality: N/A;  . RENAL ANGIOGRAPHY N/A 12/03/2016   Procedure: Renal Angiography;  Surgeon: Angelia Mould, MD;  Location: [facility];  Service: Cardiovascular;  Laterality: N/A;  . TONSILLECTOMY  1992  . TUBAL LIGATION    . TUMOR EXCISION  1998   from back of skull; developed;  MRSA from the area that had to be packed    Allergies Other; Penicillins; Zithromax [azithromycin]; Aspirin; Pineapple; Strawberry extract; Aspartame and phenylalanine; Mushroom extract complex; and Nicardipine  Family History  Problem Relation Age of Onset  . Coronary artery disease Father   . Emphysema Father   . Heart attack Father 33       Died age 61  . Stroke Father   . Cancer Father        Unsure of type   . Allergic rhinitis Father   . Depression Mother   . Skin cancer Mother   . Bipolar disorder Brother   . Drug abuse Brother   . Leukemia Brother   . Bipolar disorder Daughter   . Allergic rhinitis Daughter   . Asthma Daughter   . Urticaria Daughter   . Diabetes Maternal Grandmother   . Stomach cancer Maternal Grandfather   . Heart attack Sister        Died in 35s  . Heart attack Brother        Died age 84  . Stomach cancer Other   . Liver disease Cousin   . Angioedema Neg Hx   . Atopy Neg Hx   . Eczema Neg Hx   . Immunodeficiency Neg Hx   . Breast cancer Neg Hx   . Colon  cancer Neg Hx     Social History Social History   Tobacco Use  . Smoking status: Current Every Day Smoker    Packs/day: 1.00    Years: 45.00    Pack years: 45.00    Types: Cigarettes    Start date: 03/04/1971  . Smokeless tobacco: Never Used  Substance Use Topics  . Alcohol use: No    Alcohol/week: 0.0 standard drinks  . Drug use: No    Review of Systems  Constitutional: No fever/chills Eyes: No visual changes. ENT: No sore throat. Cardiovascular: Positive chest pain. Respiratory: Positive shortness of breath. Gastrointestinal: No abdominal pain. No nausea, no vomiting.  No diarrhea.  No constipation. Genitourinary: Negative for dysuria. Musculoskeletal: Negative for back pain. Skin: Negative for rash. Neurological: Positive HA. Baseline LUE weakness and new bilateral face numbness.   10-point ROS otherwise negative.  ____________________________________________   PHYSICAL EXAM:  VITAL SIGNS: ED Triage Vitals  Enc Vitals Group     BP 06/19/18 1626 (!) 233/116     Pulse Rate 06/19/18 1622 84     Resp 06/19/18 1622 16     Temp 06/19/18 1622 98.3 F (36.8 C)     Temp Source 06/19/18 1622 Temporal     SpO2 06/19/18 1622 99 %     Weight 06/19/18 1622 223 lb (101.2 kg)     Height 06/19/18 1622 5\' 7"  (1.702 m)     Pain Score 06/19/18 1622 10   Constitutional: Alert and oriented. Well appearing and in no acute distress. Eyes: Conjunctivae are normal. PERRL.  Head: Atraumatic. Nose: No congestion/rhinnorhea. Mouth/Throat: Mucous membranes are moist.   Neck: No stridor.  Cardiovascular: Normal rate, regular rhythm. Good peripheral circulation. Grossly normal heart sounds.   Respiratory: Normal respiratory effort.  No retractions. Lungs CTAB. Gastrointestinal: Soft and nontender. No distention.  Musculoskeletal: No lower extremity tenderness with trace bilateral LE edema. No gross deformities of extremities. Neurologic:  Normal speech and language. Subjective  decreased sensation to light touch over the bilateral face. No weakness. No other CN deficits. 4/5 strength in the LUE with normal  sensation in the upper and lower extremities.  Skin:  Skin is warm, dry and intact. No rash noted. Psychiatric: Mood and affect are normal. Speech and behavior are normal.  ____________________________________________   LABS (all labs ordered are listed, but only abnormal results are displayed)  Labs Reviewed  COMPREHENSIVE METABOLIC PANEL - Abnormal; Notable for the following components:      Result Value   Potassium 3.3 (*)    All other components within normal limits  CBC WITH DIFFERENTIAL/PLATELET - Abnormal; Notable for the following components:   Lymphs Abs 4.3 (*)    All other components within normal limits  TROPONIN I   ____________________________________________  EKG   EKG Interpretation  Date/Time:  Monday June 19 2018 17:21:38 EST Ventricular Rate:  77 PR Interval:    QRS Duration: 95 QT Interval:  405 QTC Calculation: 459 R Axis:   50 Text Interpretation:  Sinus rhythm Consider left atrial enlargement Baseline wander in lead(s) II III aVF V6 No STEMI.  Confirmed by Nanda Quinton [phone]) on 06/19/2018 5:33:58 PM       ____________________________________________  RADIOLOGY  Dg Chest 2 View  Result Date: 06/19/2018 CLINICAL DATA:  Chest pain and hypertension.  Cough. EXAM: CHEST - 2 VIEW COMPARISON:  May 01, 2018 FINDINGS: There is no edema or consolidation. The heart size and pulmonary vascularity are normal. No adenopathy. No pneumothorax. No bone lesions. IMPRESSION: No edema or consolidation. Electronically Signed   By: Lowella Grip III M.D.   On: 06/19/2018 19:53   Ct Head Wo Contrast  Result Date: 06/19/2018 CLINICAL DATA:  Headache for several hours. History of stroke. EXAM: CT HEAD WITHOUT CONTRAST TECHNIQUE: Contiguous axial images were obtained from the base of the skull through the vertex without intravenous  contrast. COMPARISON:  04/05/2018 FINDINGS: Brain: No evidence of acute infarction, hemorrhage, hydrocephalus, extra-axial collection or mass lesion/mass effect. Vascular: No hyperdense vessel or unexpected calcification. Skull: Calvarium appears intact. Sinuses/Orbits: Paranasal sinuses and mastoid air cells are clear. Other: None. IMPRESSION: No acute intracranial abnormalities. Electronically Signed   By: Lucienne Capers M.D.   On: 06/19/2018 19:43    ____________________________________________   PROCEDURES  Procedure(s) performed:   Procedures  CRITICAL CARE Performed by: Margette Fast Total critical care time: 35 minutes Critical care time was exclusive of separately billable procedures and treating other patients. Critical care was necessary to treat or prevent imminent or life-threatening deterioration. Critical care was time spent personally by me on the following activities: development of treatment plan with patient and/or surrogate as well as nursing, discussions with consultants, evaluation of patient's response to treatment, examination of patient, obtaining history from patient or surrogate, ordering and performing treatments and interventions, ordering and review of laboratory studies, ordering and review of radiographic studies, pulse oximetry and re-evaluation of patient's condition.  Nanda Quinton, MD Emergency Medicine  ____________________________________________   INITIAL IMPRESSION / ASSESSMENT AND PLAN / ED COURSE  Pertinent labs & imaging results that were available during my care of the patient were reviewed by me and considered in my medical decision making (see chart for details).  Presents to the emergency department for evaluation of elevated blood pressures.  She has had multiple prior ED visits for similar in the past.  She is complaining of multiple symptoms including chest pain, shortness of breath, leg swelling, bilateral face numbness.  She has  baseline left upper extremity weakness.  She is also experiencing headache. Unclear which of these symptoms is acute and if any are  HTN-related.  Patient was admitted in September 2019 with similar presentation and blood pressure improved with compliance of BP meds while in the hospital.  She does have severe hypertension here.  Plan for screening for hypertensive emergency and IV medication here.  Patient BP improving here. Patient suddenly left AMA. Nursing discussed with patient but patient left prior to me being notified. She pulled out her IV and would not wait for my discussed. Planned on repeat troponin. Patient BP meds were called in by Cardiology.  ____________________________________________  FINAL CLINICAL IMPRESSION(S) / ED DIAGNOSES  Final diagnoses:  Precordial chest pain  Essential hypertension  Acute non intractable tension-type headache     MEDICATIONS GIVEN DURING THIS VISIT:  Medications  ketorolac (TORADOL) 30 MG/ML injection 30 mg (30 mg Intravenous Given 06/19/18 1908)  metoCLOPramide (REGLAN) injection 10 mg (10 mg Intravenous Given 06/19/18 1908)  hydrALAZINE (APRESOLINE) injection 10 mg (10 mg Intravenous Given 06/19/18 1908)    Note:  This document was prepared using Dragon voice recognition software and may include unintentional dictation errors.  Nanda Quinton, MD Emergency Medicine    Kveon Casanas, [facility], MD 06/20/18 [phone]

## 2018-06-21 ENCOUNTER — Other Ambulatory Visit: Payer: Self-pay | Admitting: Otolaryngology

## 2018-06-27 NOTE — Progress Notes (Signed)
Chart reviewed with Dr Royce Macadamia. Pt has hx of uncontrolled HTN, was admitted 04-2018 with hypertensive crisis. She has also had a CVA and is on Plavix. Pt saw Dr Jacinta Shoe in his office 06-19-18 with BP 232/110 and was sent to ED where she left AMA. Has a hx of med noncompliance. Per Dr Royce Macadamia she will need to be moved to Main OR and have BP under control. Heather at Dr Deeann Saint office notified.

## 2018-07-04 ENCOUNTER — Ambulatory Visit (HOSPITAL_BASED_OUTPATIENT_CLINIC_OR_DEPARTMENT_OTHER): Admit: 2018-07-04 | Payer: Medicare Other | Admitting: Otolaryngology

## 2018-07-04 ENCOUNTER — Encounter (HOSPITAL_BASED_OUTPATIENT_CLINIC_OR_DEPARTMENT_OTHER): Payer: Self-pay

## 2018-07-04 SURGERY — THYROIDECTOMY
Anesthesia: General

## 2018-07-20 ENCOUNTER — Encounter: Payer: Self-pay | Admitting: Student

## 2018-07-20 ENCOUNTER — Ambulatory Visit (INDEPENDENT_AMBULATORY_CARE_PROVIDER_SITE_OTHER): Payer: Medicare Other | Admitting: Student

## 2018-07-20 VITALS — BP 158/94 | HR 86 | Ht 67.0 in | Wt 222.0 lb

## 2018-07-20 DIAGNOSIS — R0789 Other chest pain: Secondary | ICD-10-CM | POA: Diagnosis not present

## 2018-07-20 DIAGNOSIS — Z01810 Encounter for preprocedural cardiovascular examination: Secondary | ICD-10-CM | POA: Diagnosis not present

## 2018-07-20 DIAGNOSIS — Z8673 Personal history of transient ischemic attack (TIA), and cerebral infarction without residual deficits: Secondary | ICD-10-CM | POA: Diagnosis not present

## 2018-07-20 DIAGNOSIS — I10 Essential (primary) hypertension: Secondary | ICD-10-CM

## 2018-07-20 MED ORDER — LABETALOL HCL 200 MG PO TABS: 200.0000 mg | ORAL_TABLET | Freq: Two times a day (BID) | ORAL | 3 refills | Status: DC

## 2018-07-20 NOTE — Progress Notes (Signed)
Cardiology Office Note    Date:  07/20/2018   ID:  HAYLO FAKE, DOB 05/14/1961, MRN [id number]  PCP:  [name], FNP  Cardiologist: Kate Sable, MD    Chief Complaint  Patient presents with  . Follow-up    1 month visit    History of Present Illness:    Katrina [name] is a 58 y.o. female with past medical history of normal cors by cath in 2016, HTN, HLD, Type 2 DM, COPD, PTSD, and prior CVA who presents to the office today for 93-month follow-up.   She was most recently evaluated by Dr. Bronson Ing in 06/2018 and had been evaluated in the ED and had multiple admissions since her prior visits in 03/2018 which were mostly secondary to hypertensive urgency  In the setting of medication noncompliance as well as episodes of chest pain. At the time of her visit, she reported being out of Clonidine but had remained on Hydralazine (only taking BID instead of TID) along with Amlodipine. BP was significantly elevated at 232/110 during her visit and she was started on Spironolactone 25mg  daily and Labetalol 100mg  BID. She was sent directly to the ED at the time of her office visit and BP was improving but she did leave AMA.  In talking with the patient today, she reports feeling more fatigued with improvement of her blood pressure. Says she has experienced this in the past when her blood pressure was actually under better control. Reports having more energy when her SBP is greater than 180. BP is at 158/94 during today's visit.  She reports compliance with Amlodipine and Labetalol but says she only takes Hydralazine twice a day.  She actually never started Spironolactone following her last office visit as she experienced dehydration with this in the past.  She does report intermittent discomfort under her left breast which is worse with deep breathing and movement of her left arm. No association with exertion. Says she has frequent migraine headaches and was prescribed an injection by  her PCP but this was not covered by insurance. Does not take Tylenol as she reports this was not helping in the past.    Past Medical History:  Diagnosis Date  . Allergy   . Anemia 1975-1976   . Anxiety    takes Valium daily as needed  . Arthritis    "spine" (12/03/2016)  . Asthma    has inhalers but doesn't use (12/03/2016)  . Chronic bronchitis ([place])    "get it a couple times q yr" (12/03/2016)  . Chronic kidney disease   . Chronic lower back pain    budlging disc   . Claustrophobia   . COPD (chronic obstructive pulmonary disease) ([place])   . Daily headache   . Depression   . Diverticulitis   . Family history of adverse reaction to anesthesia    2 daughters gets extremely sick   . GERD (gastroesophageal reflux disease)    takes Dexilant daily  . Heart murmur   . History of blood transfusion 1975-1976 "several"   no abnormal reaction noted  . History of colitis   . History of colon polyps    benign  . History of gastric ulcer   . History of hiatal hernia    "small one"  . History of MRSA infection 2017  . Hyperlipidemia    was on meds but has been off over a yr  . Hypertension    takes Amlodipine and Maxzide daily  . Insomnia  takes Ambien nightly  . Iron deficiency anemia    "when I was young"  . Lung nodules   . Migraine    "2-3/wk" (12/03/2016)  . MS (multiple sclerosis) ([place])    questionable per pt  . Noncompliance   . Osteoporosis   . Peripheral neuropathy    weakness,numbness,and tingling. Takes Gabapentin daily  . PTSD (post-traumatic stress disorder) dx'd 2016/2017   "was dx'd w/bipolar in 1991; replaced w/PTSD dx 2016/2017" (12/03/2016)  . Restless leg syndrome    takes Requip daily  . 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 Type II diabetes mellitus ([place])    "went off insulin in 2012/2013" (12/03/2016)    Past Surgical History:  Procedure Laterality Date  .  ABDOMINAL AORTOGRAM N/A 12/03/2016   Procedure: Abdominal Aortogram;  Surgeon: Angelia Mould, MD;  Location: [facility];  Service: Cardiovascular;  Laterality: N/A;  . ABDOMINAL HYSTERECTOMY  12/1987  . ADENOIDECTOMY  1975  . ANKLE SURGERY Bilateral 1993; 1995 X2   "stabilzation done; 1 on the right; 2 on the left"  . APPENDECTOMY  1989  . BREAST EXCISIONAL BIOPSY Left   . BREAST LUMPECTOMY Left    "benign tumor"  . CARDIAC CATHETERIZATION N/A 01/31/2015   Procedure: Left Heart Cath and Coronary Angiography;  Surgeon: Burnell Blanks, MD;  Location: [place] [facility];  Service: Cardiovascular;  Laterality: N/A;  . COLONOSCOPY    . DILATION AND CURETTAGE OF UTERUS    . ESOPHAGOGASTRODUODENOSCOPY    . LAPAROSCOPIC CHOLECYSTECTOMY  1996  . NASAL RECONSTRUCTION  1976  . NASAL SINUS SURGERY  1975  . POSTERIOR LUMBAR FUSION  2005  . RADIOLOGY WITH ANESTHESIA N/A 01/15/2016   Procedure: MRI LUMBAR SPINE WITHOUT;  Surgeon: Medication Radiologist, MD;  Location: [facility];  Service: Radiology;  Laterality: N/A;  . RADIOLOGY WITH ANESTHESIA N/A 06/29/2016   Procedure: MRI OF THE BRAIN WITH AND WITHOUT;  Surgeon: Medication Radiologist, MD;  Location: [facility];  Service: Radiology;  Laterality: N/A;  . RENAL ANGIOGRAPHY N/A 12/03/2016   Procedure: Renal Angiography;  Surgeon: Angelia Mould, MD;  Location: [facility];  Service: Cardiovascular;  Laterality: N/A;  . TONSILLECTOMY  1992  . TUBAL LIGATION    . TUMOR EXCISION  1998   from back of skull; developed;  MRSA from the area that had to be packed    Current Medications: Outpatient Medications Prior to Visit  Medication Sig Dispense Refill  . albuterol (PROVENTIL HFA;VENTOLIN HFA) 108 (90 Base) MCG/ACT inhaler Inhale 1-2 puffs into the lungs every 6 (six) hours as needed for wheezing or shortness of breath.     Marland Kitchen amLODipine (NORVASC) 10 MG tablet Take 1 tablet (10 mg total) by mouth daily. (Patient taking  differently: Take 10 mg by mouth every morning. ) 30 tablet 6  . aspirin-acetaminophen-caffeine (EXCEDRIN EXTRA STRENGTH) 250-250-65 MG tablet Take 2 tablets by mouth daily as needed for headache or migraine.     . clopidogrel (PLAVIX) 75 MG tablet Take 75 mg by mouth every evening.     Marland Kitchen dexlansoprazole (DEXILANT) 60 MG capsule Take 1 tab by mouth every morning. (Patient taking differently: Take 60 mg by mouth every evening. ) 30 capsule 1  . EPINEPHrine (EPIPEN 2-PAK) 0.3 mg/0.3 mL IJ SOAJ injection Inject 0.3 mg into the muscle once as needed (for allergic reaction).     Marland Kitchen  fluticasone (FLOVENT HFA) 220 MCG/ACT inhaler Inhale 1 puff into the lungs 2 (two) times daily.     Marland Kitchen gabapentin (NEURONTIN) 600 MG tablet Take 600 mg by mouth 4 (four) times daily.    . hydrALAZINE (APRESOLINE) 100 MG tablet Take 1 tablet (100 mg total) by mouth 3 (three) times daily. 90 tablet 6  . rOPINIRole (REQUIP) 4 MG tablet Take 4 mg by mouth at bedtime.    Marland Kitchen zolpidem (AMBIEN CR) 12.5 MG CR tablet Take 12.5 mg by mouth at bedtime.    Marland Kitchen labetalol (NORMODYNE) 100 MG tablet Take 1 tablet (100 mg total) by mouth 2 (two) times daily. 180 tablet 3  . spironolactone (ALDACTONE) 25 MG tablet Take 1 tablet (25 mg total) by mouth daily. 90 tablet 3   No facility-administered medications prior to visit.      Allergies:   Other; Penicillins; Zithromax [azithromycin]; Aspirin; Pineapple; Strawberry extract; Spironolactone; Aspartame and phenylalanine; Mushroom extract complex; and Nicardipine   Social History   Socioeconomic History  . Marital status: Divorced    Spouse name: Not on file  . Number of children: 3  . Years of education: Some college  . Highest education level: Not on file  Occupational History    Comment: Disability for back pain  Social Needs  . Financial resource strain: Not on file  . Food insecurity:    Worry: Not on file    Inability: Not on file  . Transportation needs:    Medical: Not on file      Non-medical: Not on file  Tobacco Use  . Smoking status: Current Every Day Smoker    Packs/day: 1.00    Years: 45.00    Pack years: 45.00    Types: Cigarettes    Start date: 03/04/1971  . Smokeless tobacco: Never Used  Substance and Sexual Activity  . Alcohol use: No    Alcohol/week: 0.0 standard drinks  . Drug use: No  . Sexual activity: Never    Birth control/protection: Surgical  Lifestyle  . Physical activity:    Days per week: Not on file    Minutes per session: Not on file  . Stress: Not on file  Relationships  . Social connections:    Talks on phone: Not on file    Gets together: Not on file    Attends religious service: Not on file    Active member of club or organization: Not on file    Attends meetings of clubs or organizations: Not on file    Relationship status: Not on file  Other Topics Concern  . Not on file  Social History Narrative   Lives at home with her daughter.   Right-handed.   1-2 cups coffee in the morning and 2 sodas per day.     Family History:  The patient's family history includes Allergic rhinitis in her daughter and father; Asthma in her daughter; Bipolar disorder in her brother and daughter; Cancer in her father; Coronary artery disease in her father; Depression in her mother; Diabetes in her maternal grandmother; Drug abuse in her brother; Emphysema in her father; Heart attack in her brother and sister; Heart attack (age of onset: 81) in her father; Leukemia in her brother; Liver disease in her cousin; Skin cancer in her mother; Stomach cancer in her maternal grandfather and another family member; Stroke in her father; Urticaria in her daughter.   Review of Systems:   Please see the history of present illness.  General:  No chills, fever, night sweats or weight changes. Positive for fatigue.  Cardiovascular:  No chest pain, dyspnea on exertion, edema, orthopnea, palpitations, paroxysmal nocturnal dyspnea. Dermatological: No rash,  lesions/masses Respiratory: No cough, dyspnea Urologic: No hematuria, dysuria Abdominal:   No nausea, vomiting, diarrhea, bright red blood per rectum, melena, or hematemesis Neurologic:  No visual changes, wkns, changes in mental status. Positive for headaches.   All other systems reviewed and are otherwise negative except as noted above.   Physical Exam:    VS:  BP (!) 158/94   Pulse 86   Ht 5\' 7"  (1.702 m)   Wt 222 lb (100.7 kg)   SpO2 98%   BMI 34.77 kg/m    General: Well developed, well nourished Caucasian female appearing in no acute distress. Head: Normocephalic, atraumatic, sclera non-icteric, no xanthomas, nares are without discharge.  Neck: No carotid bruits. JVD not elevated.  Lungs: Respirations regular and unlabored, without wheezes or rales.  Heart: Regular rate and rhythm. No S3 or S4.  No murmur, no rubs, or gallops appreciated. Tender to palpation along entire left precordium.  Abdomen: Soft, non-tender, non-distended with normoactive bowel sounds. No hepatomegaly. No rebound/guarding. No obvious abdominal masses. Msk:  Strength and tone appear normal for age. No joint deformities or effusions. Extremities: No clubbing or cyanosis. Trace ankle edema bilaterally.  Distal pedal pulses are 2+ bilaterally. Neuro: Alert and oriented X 3. Moves all extremities spontaneously. No focal deficits noted. Psych:  Responds to questions appropriately with a normal affect. Skin: No rashes or lesions noted  Wt Readings from Last 3 Encounters:  07/20/18 222 lb (100.7 kg)  06/19/18 223 lb (101.2 kg)  06/19/18 223 lb (101.2 kg)     Studies/Labs Reviewed:   EKG:  EKG is not ordered today. EKG from 06/19/2018 is reviewed which shows NSR, HR 77, with no acute ST changes when compared to prior tracings.   Recent Labs: 02/06/2018: Magnesium 2.2 05/01/2018: B Natriuretic Peptide 30.0; TSH 6.752 06/19/2018: ALT 15; BUN 15; Creatinine, Ser 0.73; Hemoglobin 13.5; Platelets 268; Potassium  3.3; Sodium 140   Lipid Panel    Component Value Date/Time   CHOL 140 08/21/2017 0525   TRIG 142 08/21/2017 0525   HDL 30 (L) 08/21/2017 0525   CHOLHDL 4.7 08/21/2017 0525   VLDL 28 08/21/2017 0525   LDLCALC 82 08/21/2017 0525    Additional studies/ records that were reviewed today include:   Echocardiogram: 05/2017 Study Conclusions  - Left ventricle: The cavity size was normal. Systolic function was   vigorous. The estimated ejection fraction was in the range of 65%   to 70%. Wall motion was normal; there were no regional wall   motion abnormalities. Doppler parameters are consistent with   abnormal left ventricular relaxation (grade 1 diastolic   dysfunction). Doppler parameters are consistent with high   ventricular filling pressure. Mild concentric and mild to   moderate focal basal septal hypertrophy.   Coronary CT: 03/22/2018 Coronary Arteries: Right dominant with no anomalies  LM: No plaque or stenosis.  LAD system: No plaque or stenosis.  Circumflex system: No plaque or stenosis.  RCA system: Small area of calcified plaque mid RCA, no significant stenosis.  IMPRESSION: 1. Coronary artery calcium score 3.7 Agatston units, placing the patient in the 74th percentile for age and gender, suggesting intermediate risk for future cardiac events.  2.  No obstructive coronary disease  Assessment:    1. Accelerated hypertension   2. Atypical chest pain  3. History of CVA (cerebrovascular accident)   4. Pre-operative cardiovascular examination      Plan:   In order of problems listed above:  1. Accelerated HTN - She has experienced multiple admissions for hypertensive urgency in the setting of medication noncompliance and BP was significantly elevated to 232/110 at the time of her last office visit which led to referral to the ED. Her numbers have significantly improved to 158/94 during today's visit. I rechecked this personally with similar readings.  She does not follow blood pressure at home. She is hesitant to remain on medications as she feels fatigued when her blood pressure is under better control but we reviewed the importance of blood pressure control for her overall health, including her history of prior CVA's. She is currently on Amlodipine 10 mg daily, Hydralazine 100 mg twice daily (noncompliant with TID dosing), and Labetalol 100 mg twice daily. She refuses to take Spironolactone and is reluctant to the addition of any other medications. She is in agreement to have titration of her current regimen, therefore will titrate Labetalol from 100 mg twice daily to 200 mg twice daily. The importance of compliance with her regimen was reviewed with the patient multiple times.  2. Atypical Chest Pain - she has a history of intermittent chest pain which led to a Coronary CT being obtained in 03/2018 which showed an area of calcified plaque along the mid RCA but no significant stenosis and no obstructive CAD along the coronary tree. - The pain she is describing today is worse with deep breathing and movement of her left arm. She is diffusely tender to palpation on examination and her symptoms seem most consistent with a musculoskeletal etiology. Would not pursue repeat ischemic evaluation at this time in the setting of recent testing and her atypical symptoms.   3. History of CVA - reports her last event was in 2016. Has been continued on Plavix per Neurology's recommendations.   4. Preoperative Cardiac Clearance  - Reports that her partial thyroid lobectomy was canceled in 06/2018 due to her elevated BP. Numbers have improved as outlined above but this continues to be an intermittent issue in the setting of medication noncompliance. At this time, her BP has improved and no further cardiac testing would be indicated prior to surgery as she recently underwent a Coronary CT which showed no obstructive CAD. She has been on Plavix from a Neurology  perspective. Will route today's note to ENT per patient's request.    Medication Adjustments/Labs and Tests Ordered: Current medicines are reviewed at length with the patient today.  Concerns regarding medicines are outlined above.  Medication changes, Labs and Tests ordered today are listed in the Patient Instructions below. Patient Instructions  Medication Instructions:  Your physician has recommended you make the following change in your medication:  Increase Labetalol to 200 mg Two Times Daily   If you need a refill on your cardiac medications before your next appointment, please call your pharmacy.   Lab work: NONE  If you have labs (blood work) drawn today and your tests are completely normal, you will receive your results only by: Marland Kitchen MyChart Message (if you have MyChart) OR . A paper copy in the mail If you have any lab test that is abnormal or we need to change your treatment, we will call you to review the results.  Testing/Procedures: NONE   Follow-Up: At [facility], you and your health needs are our priority.  As part of our continuing mission  to provide you with exceptional heart care, we have created designated Provider Care Teams.  These Care Teams include your primary Cardiologist (physician) and Advanced Practice Providers (APPs -  Physician Assistants and Nurse Practitioners) who all work together to provide you with the care you need, when you need it. You will need a follow up appointment in 3 months.  Please call our office 2 months in advance to schedule this appointment.  You may see Kate Sable, MD or one of the following Advanced Practice Providers on your designated Care Team:   Bernerd Pho, PA-C [facility]) . Ermalinda Barrios, PA-C ([place])  Any Other Special Instructions Will Be Listed Below (If Applicable). Thank you for choosing [facility]!    Signed, Erma Heritage, PA-C  07/20/2018 4:46 PM    [place] [address] [place], [place] [address] Phone: [phone] Fax: [phone]

## 2018-07-20 NOTE — Patient Instructions (Signed)
Medication Instructions:  Your physician has recommended you make the following change in your medication:  Increase Labetalol to 200 mg Two Times Daily   If you need a refill on your cardiac medications before your next appointment, please call your pharmacy.   Lab work: NONE  If you have labs (blood work) drawn today and your tests are completely normal, you will receive your results only by: Marland Kitchen MyChart Message (if you have MyChart) OR . A paper copy in the mail If you have any lab test that is abnormal or we need to change your treatment, we will call you to review the results.  Testing/Procedures: NONE   Follow-Up: At [facility], you and your health needs are our priority.  As part of our continuing mission to provide you with exceptional heart care, we have created designated Provider Care Teams.  These Care Teams include your primary Cardiologist (physician) and Advanced Practice Providers (APPs -  Physician Assistants and Nurse Practitioners) who all work together to provide you with the care you need, when you need it. You will need a follow up appointment in 3 months.  Please call our office 2 months in advance to schedule this appointment.  You may see Kate Sable, MD or one of the following Advanced Practice Providers on your designated Care Team:   Bernerd Pho, PA-C [facility]) . Ermalinda Barrios, PA-C ([place])  Any Other Special Instructions Will Be Listed Below (If Applicable). Thank you for choosing [facility]!

## 2018-07-22 IMAGING — MR MR SHOULDER*R* W/O CM
4 of 5 series · 27 of 40 positions shown · non-contrast
Comparison: Radiographs 10/14/2016.  Chest CT 08/03/2016.

CLINICAL DATA: Right shoulder pain for 4 months. Worsening pain
with motion. No acute injury or prior relevant surgery.

EXAM:
MRI OF THE RIGHT SHOULDER WITHOUT CONTRAST
TECHNIQUE: Multiplanar, multisequence MR imaging of the shoulder was performed.
No intravenous contrast was administered.

[Series 3: T2 fat-sat · axial · 4.0mm · 0.55mm/px · z∈[-16,+84]mm · 8 of 24 slices shown (1 of 3)]
[im 1/24]
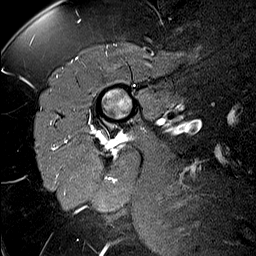
[im 4/24]
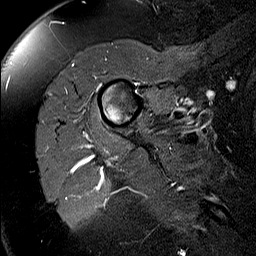
[im 7/24]
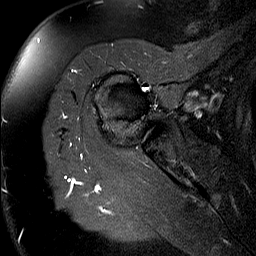
[im 10/24]
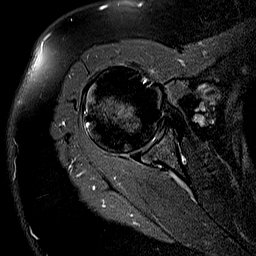
[im 14/24]
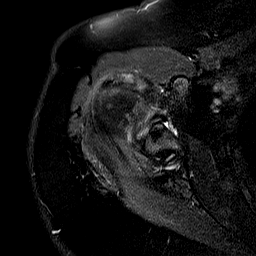
[im 17/24]
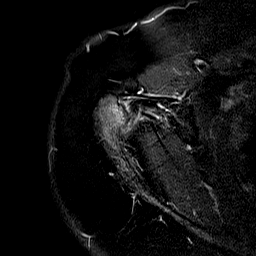
[im 20/24]
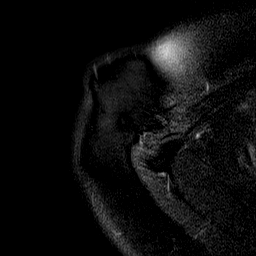
[im 24/24]
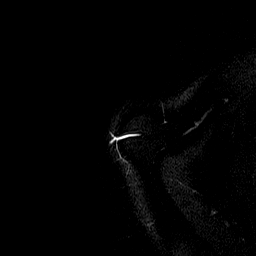

[Series 4: T2 fat-sat · oblique · 4.0mm · 0.55mm/px · 8 of 22 slices shown (2 of 3)]
[im 1/22]
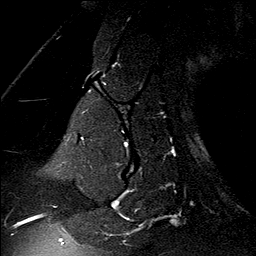
[im 4/22]
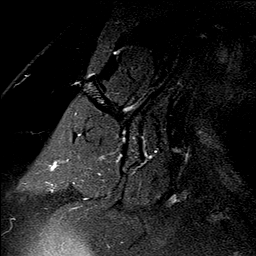
[im 7/22]
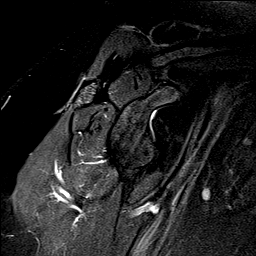
[im 10/22]
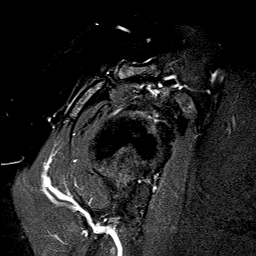
[im 13/22]
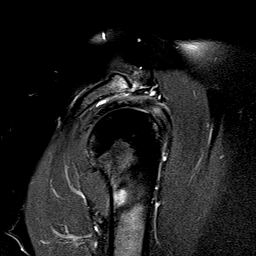
[im 16/22]
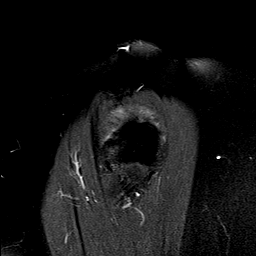
[im 19/22]
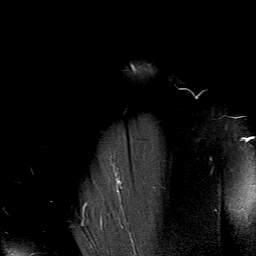
[im 22/22]
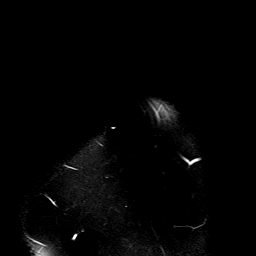

[Series 6: T2 fat-sat · oblique · 4.0mm · 0.27mm/px · 3 of 23 slices shown (3 of 3)]
[im 4/23]
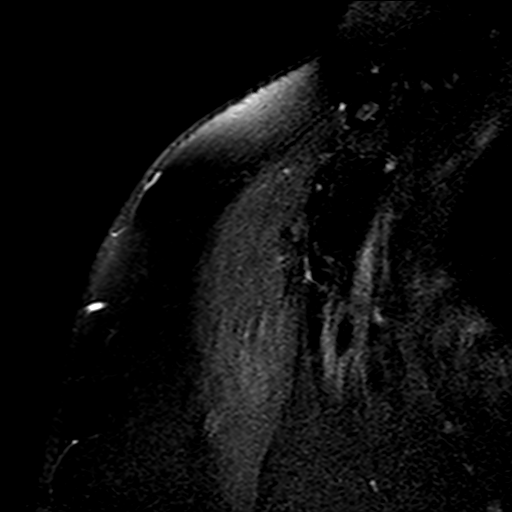
[im 13/23]
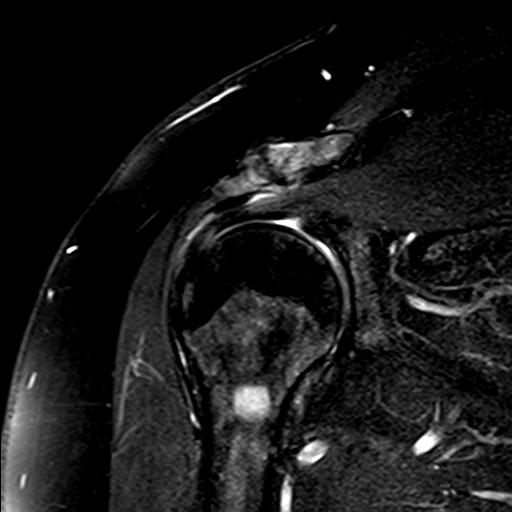
[im 19/23]
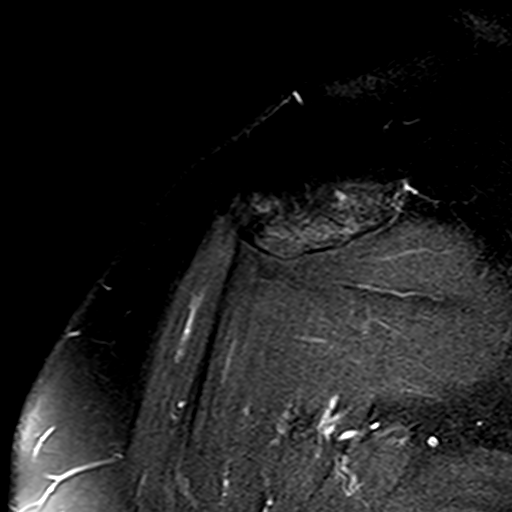

[Series 7: PD · oblique · 4.0mm · 0.22mm/px · 8 of 23 slices shown]
[im 1/23]
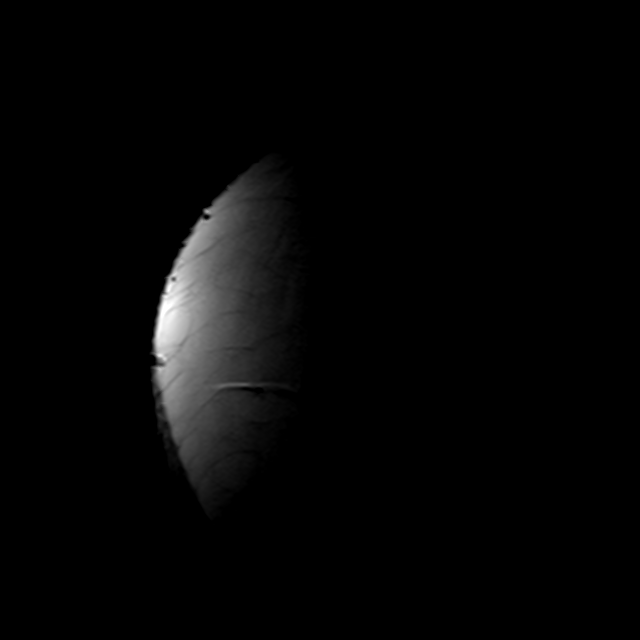
[im 4/23]
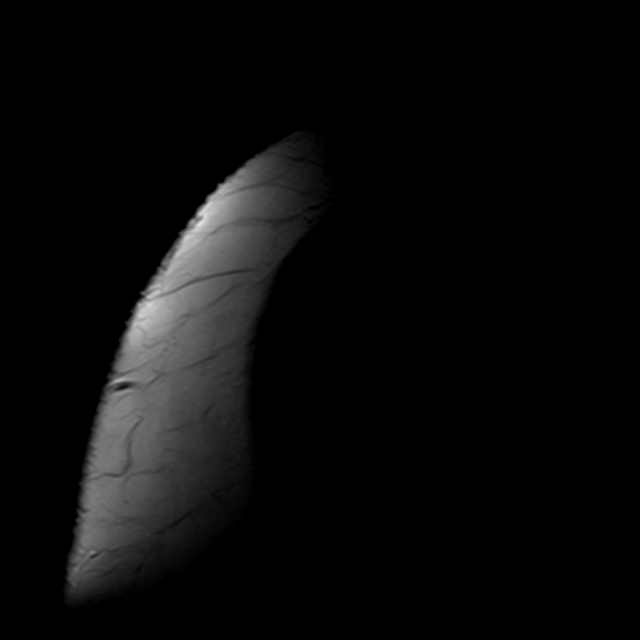
[im 7/23]
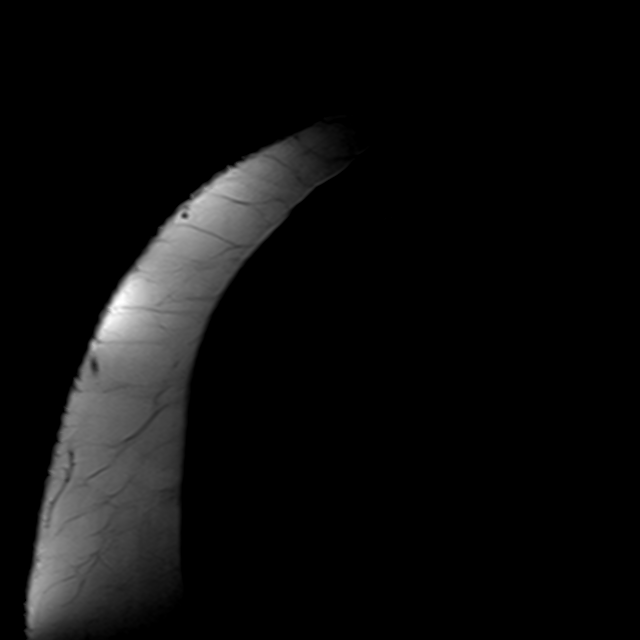
[im 10/23]
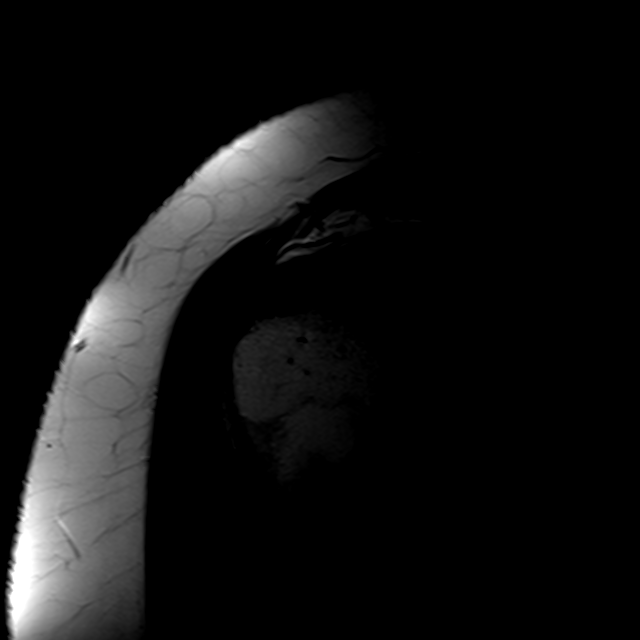
[im 13/23]
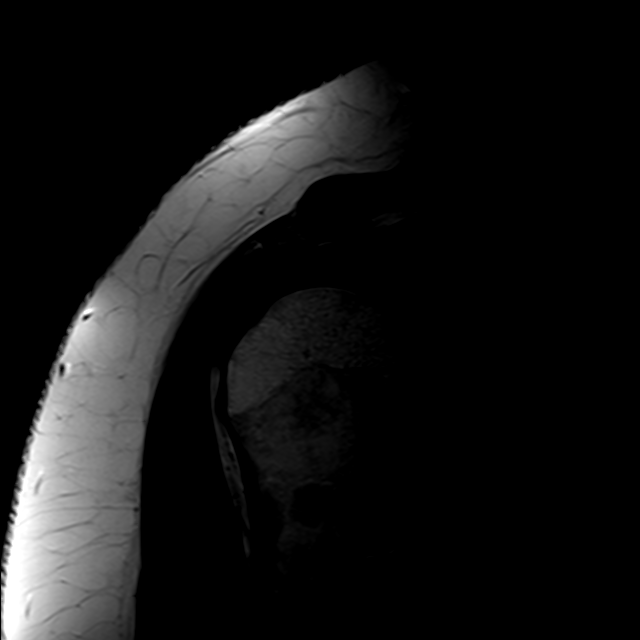
[im 16/23]
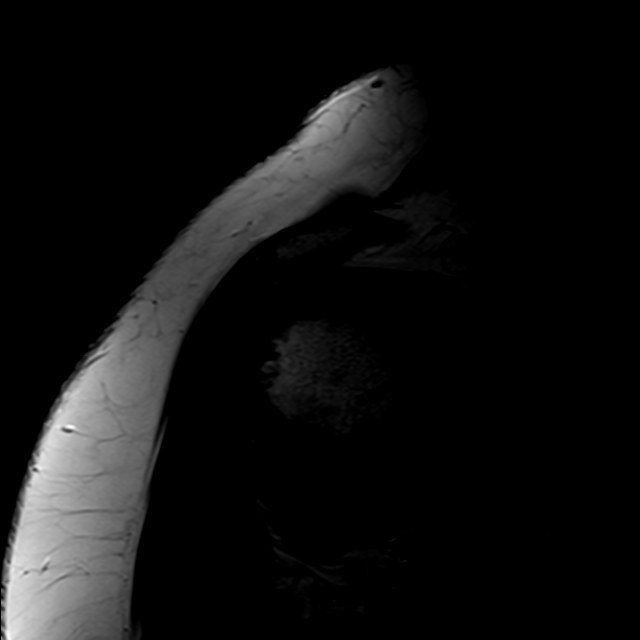
[im 19/23]
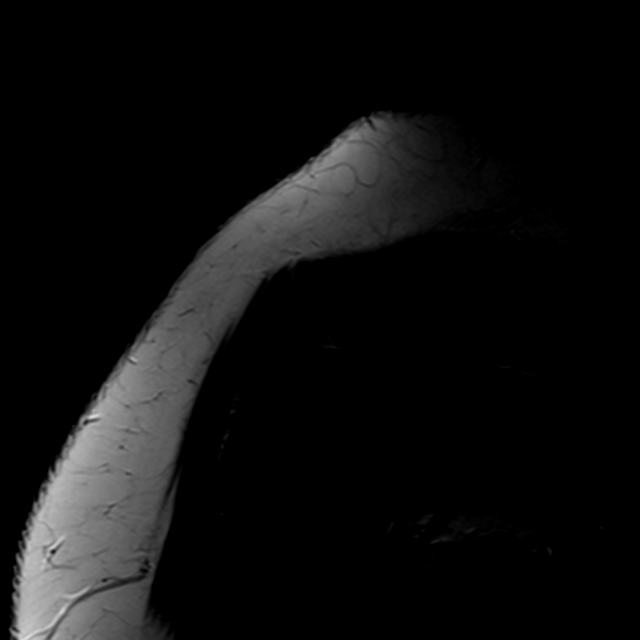
[im 23/23]
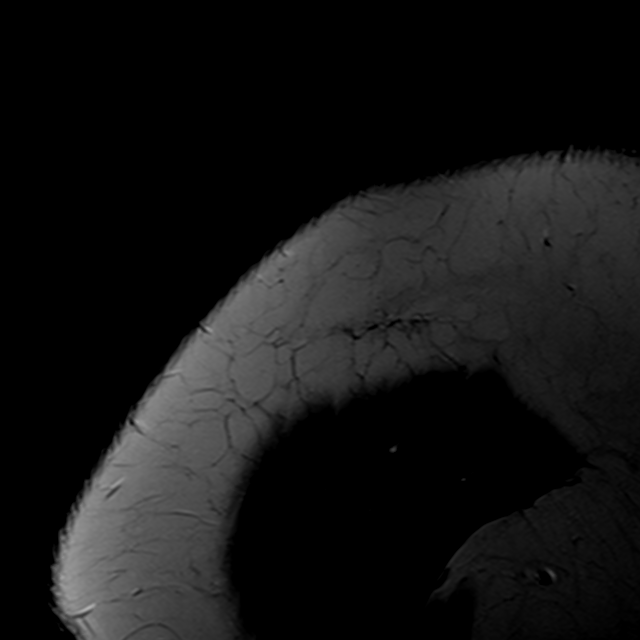

[27 of 40 positions shown; findings below may reference images not displayed]

FINDINGS: Rotator cuff: Mild supraspinatus tendinosis without evidence of
tear. The infraspinatus, subscapularis and teres minor tendons
appear normal.

Muscles:  No focal muscular atrophy or edema.

Biceps long head:  Intact and normally positioned.

Acromioclavicular Joint: The acromion is type 1 with lateral
downsloping. There are mild-to-moderate acromioclavicular
degenerative changes. Trace fluid is present in the subacromial
-subdeltoid bursa.

Glenohumeral Joint: No significant shoulder joint effusion or
glenohumeral arthropathy.

Labrum:  No evidence of labral tear.

Bones: No acute or significant extra-articular osseous findings.

Other: No significant soft tissue findings.
IMPRESSION: 1. Mild supraspinatus tendinosis.  No evidence of rotator cuff tear.
2. The labrum and biceps tendon appear intact.
3. Acromioclavicular degenerative changes may contribute to superior
shoulder pain.

## 2018-08-01 IMAGING — DX DG CHEST 2V
2 series · 2 of 2 positions shown · non-contrast
Comparison: Chest x-ray dated 10/14/2016.

CLINICAL DATA: Pt reports having chest discomfort and sob since
[REDACTED]. Pt also reports diaphoresis.

EXAM:
CHEST  2 VIEW

[chest pa]
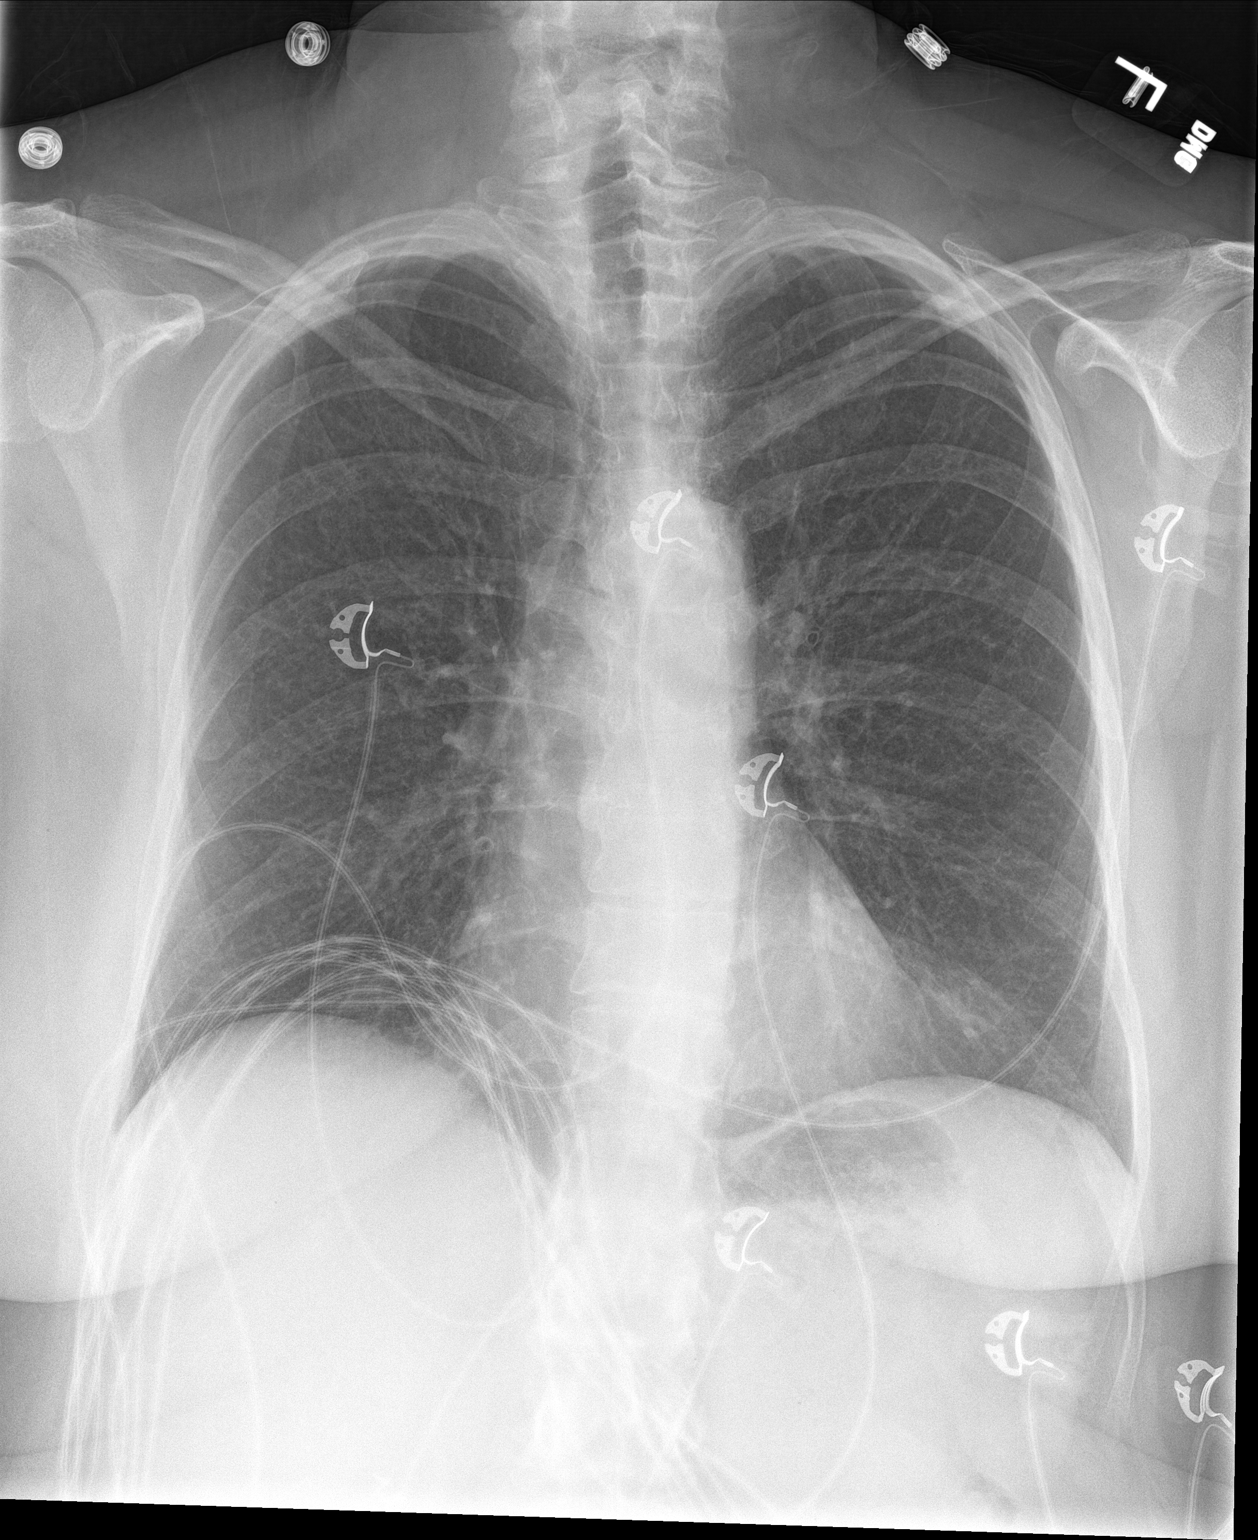

[chest lat]
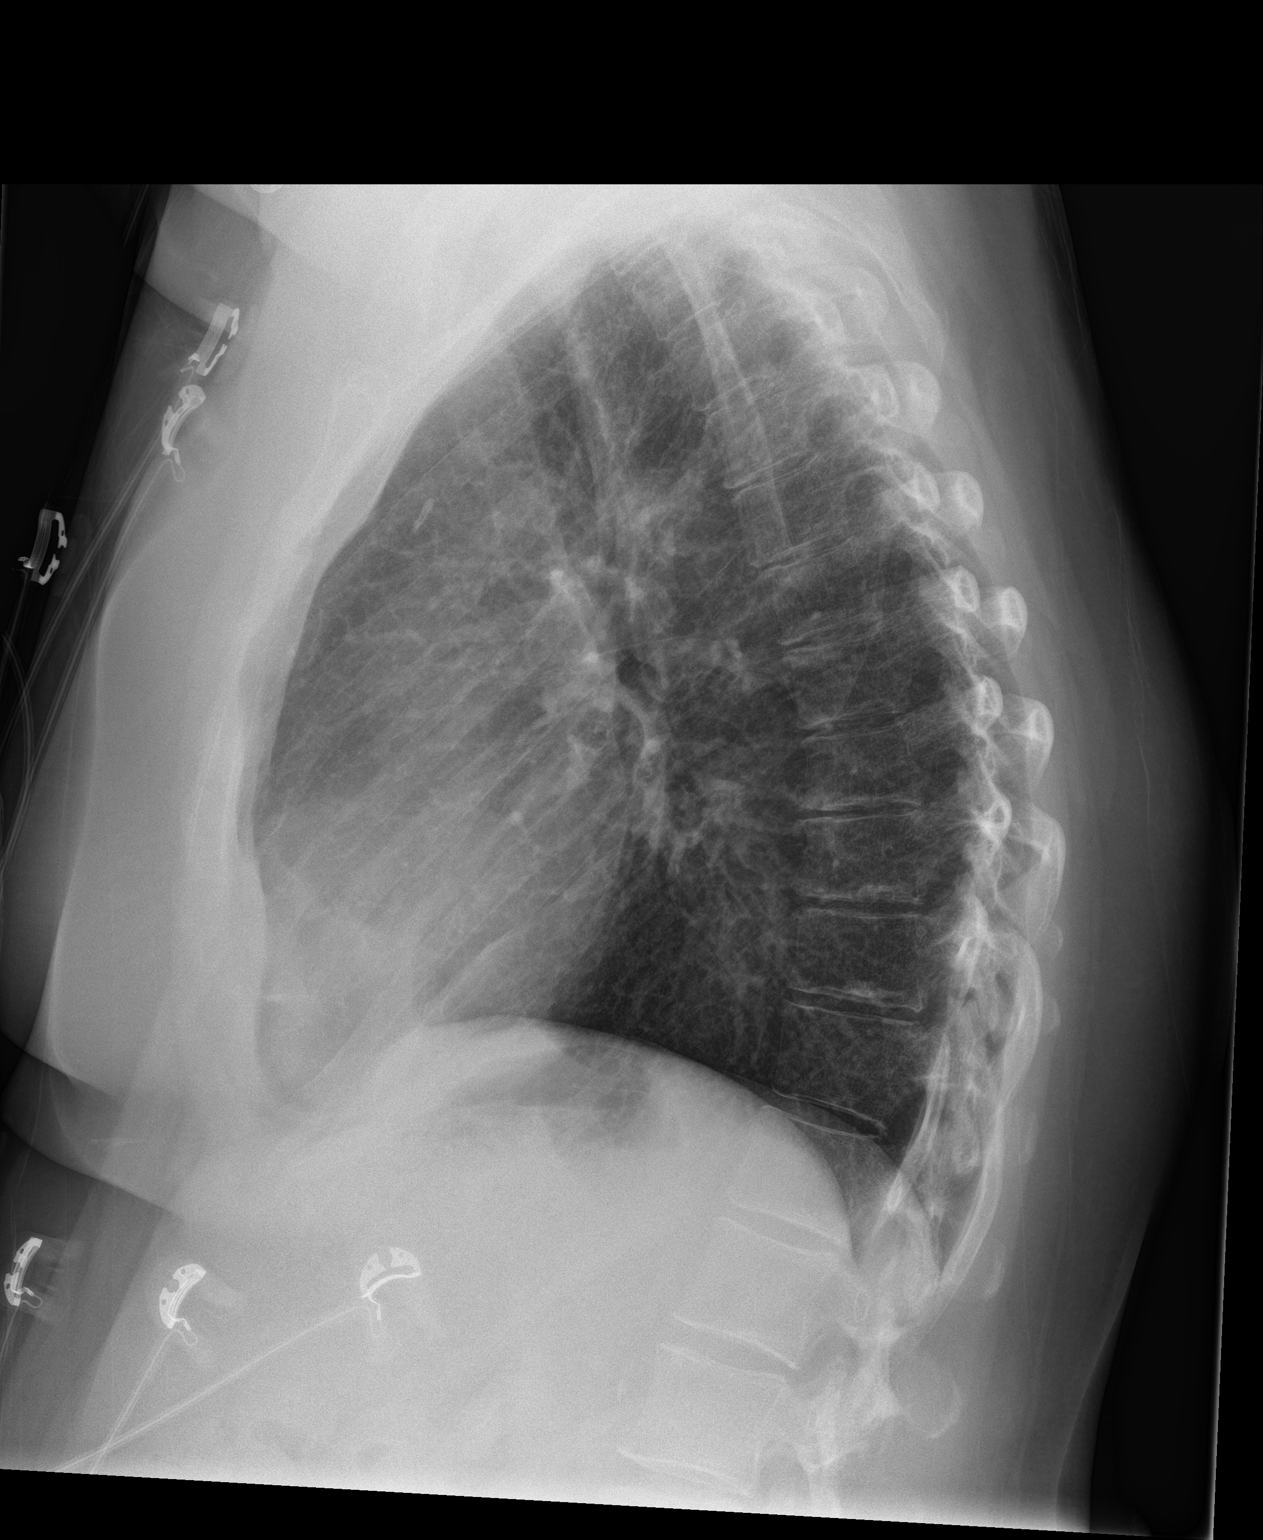

[2 of 2 positions shown; findings below may reference images not displayed]

FINDINGS: Heart size and mediastinal contours are within normal limits. Coarse
lung markings bilaterally suggests chronic interstitial lung
disease. No new lung findings. No pleural effusion or pneumothorax
seen. Osseous structures about the chest are unremarkable.
IMPRESSION: 1. No active cardiopulmonary disease. No evidence of pneumonia or
pulmonary edema.
2. Probable chronic interstitial lung disease.

## 2018-08-03 ENCOUNTER — Other Ambulatory Visit (HOSPITAL_COMMUNITY): Payer: Self-pay | Admitting: Family Medicine

## 2018-08-03 ENCOUNTER — Ambulatory Visit (HOSPITAL_COMMUNITY)
Admission: RE | Admit: 2018-08-03 | Discharge: 2018-08-03 | Disposition: A | Discharge status: Home / Self Care | Payer: Medicare Other | Source: Ambulatory Visit | Attending: Family Medicine | Admitting: Family Medicine

## 2018-08-03 DIAGNOSIS — M79661 Pain in right lower leg: Secondary | ICD-10-CM

## 2018-08-11 ENCOUNTER — Emergency Department (HOSPITAL_COMMUNITY): Payer: Medicare Other

## 2018-08-11 ENCOUNTER — Other Ambulatory Visit: Payer: Self-pay

## 2018-08-11 ENCOUNTER — Emergency Department (HOSPITAL_COMMUNITY)
Admission: EM | Admit: 2018-08-11 | Discharge: 2018-08-11 | Disposition: A | Discharge status: Home / Self Care | Payer: Medicare Other | Attending: Emergency Medicine | Admitting: Emergency Medicine

## 2018-08-11 ENCOUNTER — Encounter (HOSPITAL_COMMUNITY): Payer: Self-pay | Admitting: Emergency Medicine

## 2018-08-11 DIAGNOSIS — E119 Type 2 diabetes mellitus without complications: Secondary | ICD-10-CM | POA: Diagnosis not present

## 2018-08-11 DIAGNOSIS — Z7984 Long term (current) use of oral hypoglycemic drugs: Secondary | ICD-10-CM | POA: Diagnosis not present

## 2018-08-11 DIAGNOSIS — J449 Chronic obstructive pulmonary disease, unspecified: Secondary | ICD-10-CM | POA: Insufficient documentation

## 2018-08-11 DIAGNOSIS — Z79899 Other long term (current) drug therapy: Secondary | ICD-10-CM | POA: Insufficient documentation

## 2018-08-11 DIAGNOSIS — R0602 Shortness of breath: Secondary | ICD-10-CM | POA: Diagnosis not present

## 2018-08-11 DIAGNOSIS — Z8673 Personal history of transient ischemic attack (TIA), and cerebral infarction without residual deficits: Secondary | ICD-10-CM | POA: Diagnosis not present

## 2018-08-11 DIAGNOSIS — R0789 Other chest pain: Secondary | ICD-10-CM | POA: Insufficient documentation

## 2018-08-11 DIAGNOSIS — F17210 Nicotine dependence, cigarettes, uncomplicated: Secondary | ICD-10-CM | POA: Insufficient documentation

## 2018-08-11 DIAGNOSIS — I10 Essential (primary) hypertension: Secondary | ICD-10-CM | POA: Diagnosis not present

## 2018-08-11 LAB — CBC WITH DIFFERENTIAL/PLATELET
Abs Immature Granulocytes: 0.02 10*3/uL (ref 0.00–0.07)
Basophils Absolute: 0.1 10*3/uL (ref 0.0–0.1)
Basophils Relative: 1 %
EOS ABS: 0.3 10*3/uL (ref 0.0–0.5)
EOS PCT: 3 %
HCT: 42.8 % (ref 36.0–46.0)
Hemoglobin: 13.5 g/dL (ref 12.0–15.0)
Immature Granulocytes: 0 %
Lymphocytes Relative: 43 %
Lymphs Abs: 4.6 10*3/uL — ABNORMAL HIGH (ref 0.7–4.0)
MCH: 28.3 pg (ref 26.0–34.0)
MCHC: 31.5 g/dL (ref 30.0–36.0)
MCV: 89.7 fL (ref 80.0–100.0)
Monocytes Absolute: 0.7 10*3/uL (ref 0.1–1.0)
Monocytes Relative: 7 %
Neutro Abs: 5 10*3/uL (ref 1.7–7.7)
Neutrophils Relative %: 46 %
Platelets: 276 10*3/uL (ref 150–400)
RBC: 4.77 MIL/uL (ref 3.87–5.11)
RDW: 14.6 % (ref 11.5–15.5)
WBC: 10.7 10*3/uL — ABNORMAL HIGH (ref 4.0–10.5)
nRBC: 0 % (ref 0.0–0.2)

## 2018-08-11 LAB — URINALYSIS, ROUTINE W REFLEX MICROSCOPIC
Bilirubin Urine: NEGATIVE
Glucose, UA: NEGATIVE mg/dL
Ketones, ur: NEGATIVE mg/dL
Leukocytes,Ua: NEGATIVE
Nitrite: NEGATIVE
Protein, ur: NEGATIVE mg/dL
Specific Gravity, Urine: 1.012 (ref 1.005–1.030)
pH: 7 (ref 5.0–8.0)

## 2018-08-11 LAB — BASIC METABOLIC PANEL
Anion gap: 6 (ref 5–15)
BUN: 15 mg/dL (ref 6–20)
CALCIUM: 8.8 mg/dL — AB (ref 8.9–10.3)
CO2: 25 mmol/L (ref 22–32)
Chloride: 107 mmol/L (ref 98–111)
Creatinine, Ser: 0.77 mg/dL (ref 0.44–1.00)
GFR calc Af Amer: >60 mL/min (ref >60)
Glucose, Bld: 85 mg/dL (ref 70–99)
Potassium: 3.5 mmol/L (ref 3.5–5.1)
Sodium: 138 mmol/L (ref 135–145)

## 2018-08-11 LAB — TROPONIN I: Troponin I: <0.03 ng/mL (ref <0.03)

## 2018-08-11 LAB — BRAIN NATRIURETIC PEPTIDE: B Natriuretic Peptide: 44 pg/mL (ref 0.0–100.0)

## 2018-08-11 NOTE — ED Triage Notes (Signed)
Pt c/o SOB and leg swelling x 3 days

## 2018-08-11 NOTE — ED Notes (Signed)
Pt reports "slicing, stabbing" CP that started two days ago, reports none today. Increased SOB, orthopnea, and dyspnea with exertion. States abd and BLE edema. Noted decrease in urination per pt, pt has had two voids today.

## 2018-08-11 NOTE — Discharge Instructions (Addendum)
Work-up for the chest pain bilateral leg swelling without any acute findings.  Heart marker negative chest x-ray clear for any fluid.  Kidney functions normal.  Blood pressure however is still very elevated.  It seems to be similar to pressures you have had in the past.  Recommend very close follow-up with your primary care doctor again.  Based on the numbers tonight then your compliance with taking your medications currently will need an adjustment in those medications.  Return for any new or worse symptoms.

## 2018-08-11 NOTE — ED Provider Notes (Signed)
[facility] EMERGENCY DEPARTMENT Provider Note   CSN: [id number] Arrival date & time: 08/11/18  2026    History   Chief Complaint Chief Complaint  Patient presents with  . Shortness of Breath    HPI Katrina [name] is a 58 y.o. female.  HPI: A 58 year old patient with a history of treated diabetes, hypertension and hypercholesterolemia presents for evaluation of chest pain. Initial onset of pain was more than 6 hours ago. The patient's chest pain is well-localized and is not worse with exertion. The patient's chest pain is not middle- or left-sided, is not described as heaviness/pressure/tightness, is not sharp and does not radiate to the arms/jaw/neck. The patient does not complain of nausea and denies diaphoresis. The patient has smoked in the past 90 days. The patient has no history of stroke, has no history of peripheral artery disease, has no relevant family history of coronary artery disease (first degree relative at less than age 84) and does not have an elevated BMI (>=30).   Patient presenting with several complaints.  Initially in triage it was shortness of breath and bilateral leg swelling for 3 days.  Patient also talked about on Wednesday 2 days ago left lateral chest area up underneath her left arm with very sharp pain.  Somewhat sore to touch and made worse by movement of the left arm.  Patient states that she is got exertional shortness of breath but at rest she feels okay.  Patient has a known history of significant hypertension with difficult to control and some concerns for noncompliance.  Patient is an every day smoker she also has diabetes and has hyperlipidemia.  Patient states that she is taking her hydralazine her Norvasc and her labetalol.  She is followed by primary care doctor states she saw them a week ago pressures were high normally she is around 200/100.  That is sort of where she is today.  She has had not had any further chest pain.  The leg swelling is bilateral.   Not associated with any pain.  No nausea vomiting no diaphoresis     Past Medical History:  Diagnosis Date  . Allergy   . Anemia 1975-1976   . Anxiety    takes Valium daily as needed  . Arthritis    "spine" (12/03/2016)  . Asthma    has inhalers but doesn't use (12/03/2016)  . Chronic bronchitis ([place])    "get it a couple times q yr" (12/03/2016)  . Chronic kidney disease   . Chronic lower back pain    budlging disc   . Claustrophobia   . COPD (chronic obstructive pulmonary disease) ([place])   . Daily headache   . Depression   . Diverticulitis   . Family history of adverse reaction to anesthesia    2 daughters gets extremely sick   . GERD (gastroesophageal reflux disease)    takes Dexilant daily  . Heart murmur   . History of blood transfusion 1975-1976 "several"   no abnormal reaction noted  . History of colitis   . History of colon polyps    benign  . History of gastric ulcer   . History of hiatal hernia    "small one"  . History of MRSA infection 2017  . Hyperlipidemia    was on meds but has been off over a yr  . Hypertension    takes Amlodipine and Maxzide daily  . Insomnia    takes Ambien nightly  . Iron deficiency anemia    "  when I was young"  . Lung nodules   . Migraine    "2-3/wk" (12/03/2016)  . MS (multiple sclerosis) ([place])    questionable per pt  . Noncompliance   . Osteoporosis   . Peripheral neuropathy    weakness,numbness,and tingling. Takes Gabapentin daily  . PTSD (post-traumatic stress disorder) dx'd 2016/2017   "was dx'd w/bipolar in 1991; replaced w/PTSD dx 2016/2017" (12/03/2016)  . Restless leg syndrome    takes Requip daily  . 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 Type II diabetes mellitus ([place])    "went off insulin in 2012/2013" (12/03/2016)    Patient Active Problem List   Diagnosis Date Noted  . 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 Impaired insight 02/08/2018  . Adjustment disorder with disturbance of emotion 02/08/2018  . Left lower quadrant abdominal pain affecting pregnancy 12/21/2017  . Chest pain in adult 08/20/2017  . Abnormal brain MRI 08/08/2017  . Weakness of left leg 05/19/2017  . Hypertension 12/03/2016  . Adverse food reaction 10/14/2016  . Pulmonary nodules 10/14/2016  . Non-seasonal allergic rhinitis due to fungal spores 10/14/2016  . Cigarette smoker 09/24/2016  . Cerebral microvascular disease 08/19/2016  . Non compliance with medical treatment 05/11/2016  . Leg pain, anterior, right 05/11/2016  . Syncope 05/02/2016  . Type 2 diabetes mellitus with diabetic neuropathy ([place]) 05/02/2016  . Restless leg syndrome 05/02/2016  . History of CVA with residual deficit   . Vascular headache   . History of chest pain   . Generalized anxiety disorder   . Chronic bilateral low back pain without sciatica   . Peripheral neuropathy   . Migraine without status migrainosus, not intractable   . PTSD (post-traumatic stress disorder)   . History of cerebrovascular accident (CVA) with residual deficit   . CVA (cerebral vascular accident) ([place]) 04/03/2016  . Stroke (cerebrum) ([place]) 04/03/2016  . Post traumatic stress disorder 10/06/2015  . Precordial pain   . Migraine syndrome 01/24/2015  . Migraine 01/24/2015  . TIA (transient ischemic attack) 12/25/2014  . Hypertensive urgency   . Atypical chest pain   . Malignant hypertension   . Hypokalemia 04/16/2014  . Muscle weakness (generalized) 04/05/2014  . Stiffness of left hip joint 04/05/2014  . Pain in left hip 04/05/2014  . Diastolic dysfunction 63/14/9702  . Normal coronary arteries 03/20/2014  . PUD (peptic ulcer disease) 03/20/2014  . Type 2 diabetes, uncontrolled, with neuropathy ([place]) 03/20/2014  . Morbid obesity due to excess calories ([place]) 03/20/2014  . Left-sided weakness 03/20/2014  . Paresthesias 03/20/2014  . Cerebrovascular  disease 03/20/2014  . Dyspnea on exertion 03/05/2012  . Tobacco abuse 03/05/2012  . Moderate COPD (chronic obstructive pulmonary disease) ([place]) 11/23/2011  . GERD (gastroesophageal reflux disease) 11/23/2011  . Uncontrolled hypertension 11/23/2011    Past Surgical History:  Procedure Laterality Date  . ABDOMINAL AORTOGRAM N/A 12/03/2016   Procedure: Abdominal Aortogram;  Surgeon: Angelia Mould, MD;  Location: [place] [facility];  Service: Cardiovascular;  Laterality: N/A;  . ABDOMINAL HYSTERECTOMY  12/1987  . ADENOIDECTOMY  1975  . ANKLE SURGERY Bilateral 1993; 1995 X2   "stabilzation done; 1 on the right; 2 on the left"  . APPENDECTOMY  1989  . BREAST EXCISIONAL BIOPSY Left   . BREAST LUMPECTOMY Left    "benign tumor"  . CARDIAC CATHETERIZATION N/A 01/31/2015   Procedure: Left Heart Cath  and Coronary Angiography;  Surgeon: Burnell Blanks, MD;  Location: [facility];  Service: Cardiovascular;  Laterality: N/A;  . COLONOSCOPY    . DILATION AND CURETTAGE OF UTERUS    . ESOPHAGOGASTRODUODENOSCOPY    . LAPAROSCOPIC CHOLECYSTECTOMY  1996  . NASAL RECONSTRUCTION  1976  . NASAL SINUS SURGERY  1975  . POSTERIOR LUMBAR FUSION  2005  . RADIOLOGY WITH ANESTHESIA N/A 01/15/2016   Procedure: MRI LUMBAR SPINE WITHOUT;  Surgeon: Medication Radiologist, MD;  Location: [place];  Service: Radiology;  Laterality: N/A;  . RADIOLOGY WITH ANESTHESIA N/A 06/29/2016   Procedure: MRI OF THE BRAIN WITH AND WITHOUT;  Surgeon: Medication Radiologist, MD;  Location: [facility];  Service: Radiology;  Laterality: N/A;  . RENAL ANGIOGRAPHY N/A 12/03/2016   Procedure: Renal Angiography;  Surgeon: Angelia Mould, MD;  Location: [place] [facility];  Service: Cardiovascular;  Laterality: N/A;  . TONSILLECTOMY  1992  . TUBAL LIGATION    . TUMOR EXCISION  1998   from back of skull; developed;  MRSA from the area that had to be packed     OB History   No obstetric history on file.       Home Medications    Prior to Admission medications   Medication Sig Start Date End Date Taking? Authorizing Provider  amLODipine (NORVASC) 10 MG tablet Take 1 tablet (10 mg total) by mouth daily. Patient taking differently: Take 10 mg by mouth every morning.  06/05/14  Yes Herminio Commons, MD  aspirin-acetaminophen-caffeine (EXCEDRIN EXTRA STRENGTH) [phone] MG tablet Take 2 tablets by mouth daily as needed for headache or migraine.    Yes [provider]  clopidogrel (PLAVIX) 75 MG tablet Take 75 mg by mouth every evening.    Yes [provider]  dexlansoprazole (DEXILANT) 60 MG capsule Take 1 tab by mouth every morning. Patient taking differently: Take 60 mg by mouth every evening.  10/18/16  Yes Esterwood, Amy S, PA-C  EPINEPHrine (EPIPEN 2-PAK) 0.3 mg/0.3 mL IJ SOAJ injection Inject 0.3 mg into the muscle once as needed (for allergic reaction).    Yes [provider]  gabapentin (NEURONTIN) 600 MG tablet Take 600 mg by mouth 4 (four) times daily.   Yes [provider]  hydrALAZINE (APRESOLINE) 100 MG tablet Take 1 tablet (100 mg total) by mouth 3 (three) times daily. 01/30/18  Yes Herminio Commons, MD  labetalol (NORMODYNE) 200 MG tablet Take 1 tablet (200 mg total) by mouth 2 (two) times daily. 07/20/18  Yes Strader, [place], PA-C  metFORMIN (GLUCOPHAGE) 500 MG tablet Take 250 mg by mouth daily. 05/18/18  Yes [provider]  rOPINIRole (REQUIP) 4 MG tablet Take 4 mg by mouth at bedtime.   Yes [provider]  zolpidem (AMBIEN CR) 12.5 MG CR tablet Take 12.5 mg by mouth at bedtime. 05/16/17  Yes [provider]    Family History Family History  Problem Relation Age of Onset  . Coronary artery disease Father   . Emphysema Father   . Heart attack Father 57       Died age 74  . Stroke Father   . Cancer Father        Unsure of type   . Allergic rhinitis Father   . Depression Mother   . Skin cancer Mother    . Bipolar disorder Brother   . Drug abuse Brother   . Leukemia Brother   . Bipolar disorder Daughter   .  Allergic rhinitis Daughter   . Asthma Daughter   . Urticaria Daughter   . Diabetes Maternal Grandmother   . Stomach cancer Maternal Grandfather   . Heart attack Sister        Died in 73s  . Heart attack Brother        Died age 62  . Stomach cancer Other   . Liver disease Cousin   . Angioedema Neg Hx   . Atopy Neg Hx   . Eczema Neg Hx   . Immunodeficiency Neg Hx   . Breast cancer Neg Hx   . Colon cancer Neg Hx     Social History Social History   Tobacco Use  . Smoking status: Current Every Day Smoker    Packs/day: 1.00    Years: 45.00    Pack years: 45.00    Types: Cigarettes    Start date: 03/04/1971  . Smokeless tobacco: Never Used  Substance Use Topics  . Alcohol use: No    Alcohol/week: 0.0 standard drinks  . Drug use: No     Allergies   Other; Penicillins; Zithromax [azithromycin]; Aspirin; Pineapple; Strawberry extract; Spironolactone; Aspartame and phenylalanine; Mushroom extract complex; and Nicardipine   Review of Systems Review of Systems  Constitutional: Negative for chills, diaphoresis and fever.  HENT: Negative for rhinorrhea and sore throat.   Eyes: Negative for visual disturbance.  Respiratory: Positive for shortness of breath. Negative for cough.   Cardiovascular: Positive for chest pain and leg swelling.  Gastrointestinal: Negative for abdominal pain, diarrhea, nausea and vomiting.  Genitourinary: Negative for dysuria.  Musculoskeletal: Negative for back pain and neck pain.  Skin: Negative for rash.  Neurological: Negative for dizziness, light-headedness and headaches.  Hematological: Does not bruise/bleed easily.  Psychiatric/Behavioral: Negative for confusion.     Physical Exam Updated Vital Signs BP (!) 169/98   Pulse 81   Temp 98.4 F (36.9 C) (Oral)   Resp (!) 24   Ht 1.702 m (5\' 7" )   Wt 102.1 kg   SpO2 97%   BMI 35.24  kg/m   Physical Exam Vitals signs and nursing note reviewed.  Constitutional:      General: She is not in acute distress.    Appearance: She is well-developed.  HENT:     Head: Normocephalic and atraumatic.     Mouth/Throat:     Mouth: Mucous membranes are moist.  Eyes:     Extraocular Movements: Extraocular movements intact.     Conjunctiva/sclera: Conjunctivae normal.     Pupils: Pupils are equal, round, and reactive to light.  Neck:     Musculoskeletal: Normal range of motion and neck supple.  Cardiovascular:     Rate and Rhythm: Normal rate and regular rhythm.     Heart sounds: Normal heart sounds. No murmur.  Pulmonary:     Effort: Pulmonary effort is normal. No respiratory distress.     Breath sounds: Normal breath sounds.  Chest:     Chest wall: No tenderness.  Abdominal:     General: Bowel sounds are normal.     Palpations: Abdomen is soft.     Tenderness: There is no abdominal tenderness.  Musculoskeletal: Normal range of motion.     Comments: Very slight edema to both lower extremities no significant swelling.  No calf tenderness.  Skin:    General: Skin is warm and dry.  Neurological:     General: No focal deficit present.     Mental Status: She is alert. She is disoriented.  ED Treatments / Results  Labs (all labs ordered are listed, but only abnormal results are displayed) Labs Reviewed  BASIC METABOLIC PANEL - Abnormal; Notable for the following components:      Result Value   Calcium 8.8 (*)    All other components within normal limits  CBC WITH DIFFERENTIAL/PLATELET - Abnormal; Notable for the following components:   WBC 10.7 (*)    Lymphs Abs 4.6 (*)    All other components within normal limits  URINALYSIS, ROUTINE W REFLEX MICROSCOPIC - Abnormal; Notable for the following components:   Color, Urine STRAW (*)    Hgb urine dipstick MODERATE (*)    Bacteria, UA RARE (*)    All other components within normal limits  URINE CULTURE  BRAIN  NATRIURETIC PEPTIDE  TROPONIN I    EKG EKG Interpretation  Date/Time:  Friday August 11 2018 21:08:04 EST Ventricular Rate:  83 PR Interval:    QRS Duration: 92 QT Interval:  385 QTC Calculation: 453 R Axis:   30 Text Interpretation:  Sinus rhythm Confirmed by Fredia Sorrow [phone]) on 08/11/2018 9:52:48 PM   Radiology Dg Chest 2 View  Result Date: 08/11/2018 CLINICAL DATA:  Intermittent chest pain EXAM: CHEST - 2 VIEW COMPARISON:  07/04/2018 FINDINGS: The heart size and mediastinal contours are within normal limits. Both lungs are clear. The visualized skeletal structures are unremarkable. IMPRESSION: No active cardiopulmonary disease. Electronically Signed   By: Donavan Foil M.D.   On: 08/11/2018 21:47    Procedures Procedures (including critical care time)  Medications Ordered in ED Medications - No data to display   Initial Impression / Assessment and Plan / ED Course  I have reviewed the triage vital signs and the nursing notes.  Pertinent labs & imaging results that were available during my care of the patient were reviewed by me and considered in my medical decision making (see chart for details).     HEAR Score: 3  Regarding the chest pain.  The episode was 2 days ago has not had any recurrence that pain 2 days ago did last for a period of time.  However troponin is negative.  No current chest pain EKG without significant abnormalities not tachycardic oxygen saturations in the upper 90s on room air.  Chest x-ray negative no evidence of any pulmonary edema.  Clinically unlikely to be pulmonary embolus.  Patient's renal functions normal.  No evidence of any renal insufficiency a matter fact her GFR's are completely normal.  Regarding the high blood pressure it somewhat concerning but it sounds as if this is kind of baseline for her I think this can be dealt with on an outpatient basis.  Based on her labs and I will do not feel that she needs to be admitted for  hypertensive urgency.  Patient does have primary care doctor to follow-up with she claims she is being compliant states that her blood pressures are usually 200/100.  This is not acceptable in the long run patient understands that.  Final Clinical Impressions(s) / ED Diagnoses   Final diagnoses:  SOB (shortness of breath)  Atypical chest pain  Essential hypertension    ED Discharge Orders    None       Fredia Sorrow, MD 08/11/18 2334

## 2018-08-13 LAB — URINE CULTURE

## 2018-10-17 ENCOUNTER — Telehealth: Payer: Self-pay | Admitting: Cardiovascular Disease

## 2018-10-17 NOTE — Telephone Encounter (Signed)
Please give Marjorie Smolder nurse case manager w/ [facility] a call @ [phone] ext [phone] concerning the pt's blood pressure.

## 2018-10-17 NOTE — Telephone Encounter (Signed)
Called Angelina- no answer. Left message for her to return call.

## 2018-10-19 MED ORDER — LABETALOL HCL 200 MG PO TABS: 200.0000 mg | ORAL_TABLET | Freq: Two times a day (BID) | ORAL | 3 refills | Status: DC

## 2018-10-19 NOTE — Telephone Encounter (Signed)
See. Darel Hong office note from 07/20/18. She has a history of noncompliance and had not been taking hydralazine TID (was taking it bid). She was also previously started on spironolactone which she refused to take. She has also been on clonidine before. I would first recommend medication compliance. If she is indeed taking amlodipine 10 mg, labetalol 200 mg bid, and hydralazine TID, then clonidine 0.1 mg bid can be added.

## 2018-10-19 NOTE — Telephone Encounter (Signed)
Called Angelina to discuss. She did not answer. I did leave a detailed message asking her to return call.

## 2018-10-19 NOTE — Telephone Encounter (Signed)
Spoke with Marjorie Smolder. Pt will pick up labetalol 200 mg bid. As she has only taken labetalol 100 mg once daily.

## 2018-10-19 NOTE — Telephone Encounter (Signed)
Returned call to [employer] (Armed forces operational officer) and she wanted to advise Dr. Bronson Ing that the pt's blood pressure has been elevated significantly for past week. (220/120) and was wanting to know if there is a medication change that could be looked at. She is currently taking norvasc 10 mg daily, lebetalol 200 mg BID, and hydralazine 100 mg TID. She did state that the pt has picked back up on her smoking, and states she is not going to quit as it is the only thing that helps her with her anxiety. Please advise.

## 2018-11-08 ENCOUNTER — Telehealth: Payer: Self-pay | Admitting: Cardiovascular Disease

## 2018-11-08 NOTE — Telephone Encounter (Signed)
Virtual Visit Pre-Appointment Phone Call  "(Name), I am calling you today to discuss your upcoming appointment. We are currently trying to limit exposure to the virus that causes COVID-19 by seeing patients at home rather than in the office."  1. "What is the BEST phone number to call the day of the visit?" - include this in appointment notes  2. Do you have or have access to (through a family member/friend) a smartphone with video capability that we can use for your visit?" a. If yes - list this number in appt notes as cell (if different from BEST phone #) and list the appointment type as a VIDEO visit in appointment notes b. If no - list the appointment type as a PHONE visit in appointment notes  3. Confirm consent - "In the setting of the current Covid19 crisis, you are scheduled for a (phone or video) visit with your provider on (date) at (time).  Just as we do with many in-office visits, in order for you to participate in this visit, we must obtain consent.  If you'd like, I can send this to your mychart (if signed up) or email for you to review.  Otherwise, I can obtain your verbal consent now.  All virtual visits are billed to your insurance company just like a normal visit would be.  By agreeing to a virtual visit, we'd like you to understand that the technology does not allow for your provider to perform an examination, and thus may limit your provider's ability to fully assess your condition. If your provider identifies any concerns that need to be evaluated in person, we will make arrangements to do so.  Finally, though the technology is pretty good, we cannot assure that it will always work on either your or our end, and in the setting of a video visit, we may have to convert it to a phone-only visit.  In either situation, we cannot ensure that we have a secure connection.  Are you willing to proceed?" STAFF: Did the patient verbally acknowledge consent to telehealth visit? Document  YES/NO here: Yes  4. Advise patient to be prepared - "Two hours prior to your appointment, go ahead and check your blood pressure, pulse, oxygen saturation, and your weight (if you have the equipment to check those) and write them all down. When your visit starts, your provider will ask you for this information. If you have an Apple Watch or Kardia device, please plan to have heart rate information ready on the day of your appointment. Please have a pen and paper handy nearby the day of the visit as well."  5. Give patient instructions for MyChart download to smartphone OR [email or web address] as below if video visit (depending on what platform provider is using)  6. Inform patient they will receive a phone call 15 minutes prior to their appointment time (may be from unknown caller ID) so they should be prepared to answer    TELEPHONE CALL NOTE  GRACEYN FODOR has been deemed a candidate for a follow-up tele-health visit to limit community exposure during the Covid-19 pandemic. I spoke with the patient via phone to ensure availability of phone/video source, confirm preferred email & phone number, and discuss instructions and expectations.  I reminded DONETTA ISAZA to be prepared with any vital sign and/or heart rhythm information that could potentially be obtained via home monitoring, at the time of her visit. I reminded COLIE JOSTEN to expect a phone call prior to  her visit.  Bertram Gala Goins 11/08/2018 4:22 PM

## 2018-11-13 ENCOUNTER — Encounter: Payer: Self-pay | Admitting: Cardiovascular Disease

## 2018-11-13 ENCOUNTER — Telehealth (INDEPENDENT_AMBULATORY_CARE_PROVIDER_SITE_OTHER): Payer: Medicare Other | Admitting: Cardiovascular Disease

## 2018-11-13 VITALS — BP 187/82 | HR 94 | Ht 67.0 in | Wt 233.0 lb

## 2018-11-13 DIAGNOSIS — I10 Essential (primary) hypertension: Secondary | ICD-10-CM | POA: Diagnosis not present

## 2018-11-13 DIAGNOSIS — Z8673 Personal history of transient ischemic attack (TIA), and cerebral infarction without residual deficits: Secondary | ICD-10-CM

## 2018-11-13 MED ORDER — CLONIDINE HCL 0.1 MG PO TABS: 0.1000 mg | ORAL_TABLET | Freq: Two times a day (BID) | ORAL | 3 refills | Status: DC

## 2018-11-13 NOTE — Progress Notes (Signed)
Virtual Visit via Telephone Note   This visit type was conducted due to national recommendations for restrictions regarding the COVID-19 Pandemic (e.g. social distancing) in an effort to limit this patient's exposure and mitigate transmission in our community.  Due to her co-morbid illnesses, this patient is at least at moderate risk for complications without adequate follow up.  This format is felt to be most appropriate for this patient at this time.  The patient did not have access to video technology/had technical difficulties with video requiring transitioning to audio format only (telephone).  All issues noted in this document were discussed and addressed.  No physical exam could be performed with this format.  Please refer to the patient's chart for her  consent to telehealth for [facility].   Date:  11/13/2018   ID:  Katrina [name], DOB October 01, 1960, MRN [id number]  Patient Location: Home Provider Location: Home  PCP:  [name], FNP  Cardiologist:  Kate Sable, MD  Electrophysiologist:  None   Evaluation Performed:  Follow-Up Visit  Chief Complaint:  Accelerated HTN  History of Present Illness:    Katrina [name] is a 58 y.o. female with a history of accelerated hypertension, CVA, and medication noncompliance.  She has an adjustment disorder with emotional disturbance as well.  She has chest pain when her BP is elevated. She has a bad cough which she's had for 2 months. She saw her PCP who felt it was due to bronchitis and COPD. Her chronic shortness of breath is stable. O2 sats 97% today.   Past Medical History:  Diagnosis Date  . Allergy   . Anemia 1975-1976   . Anxiety    takes Valium daily as needed  . Arthritis    "spine" (12/03/2016)  . Asthma    has inhalers but doesn't use (12/03/2016)  . Chronic bronchitis ([place])    "get it a couple times q yr" (12/03/2016)  . Chronic kidney disease   . Chronic lower back pain    budlging disc   . Claustrophobia    . COPD (chronic obstructive pulmonary disease) ([place])   . Daily headache   . Depression   . Diverticulitis   . Family history of adverse reaction to anesthesia    2 daughters gets extremely sick   . GERD (gastroesophageal reflux disease)    takes Dexilant daily  . Heart murmur   . History of blood transfusion 1975-1976 "several"   no abnormal reaction noted  . History of colitis   . History of colon polyps    benign  . History of gastric ulcer   . History of hiatal hernia    "small one"  . History of MRSA infection 2017  . Hyperlipidemia    was on meds but has been off over a yr  . Hypertension    takes Amlodipine and Maxzide daily  . Insomnia    takes Ambien nightly  . Iron deficiency anemia    "when I was young"  . Lung nodules   . Migraine    "2-3/wk" (12/03/2016)  . MS (multiple sclerosis) ([place])    questionable per pt  . Noncompliance   . Osteoporosis   . Peripheral neuropathy    weakness,numbness,and tingling. Takes Gabapentin daily  . PTSD (post-traumatic stress disorder) dx'd 2016/2017   "was dx'd w/bipolar in 1991; replaced w/PTSD dx 2016/2017" (12/03/2016)  . Restless leg syndrome    takes Requip daily  . 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 Type II diabetes mellitus ([place])    "went off insulin in 2012/2013" (12/03/2016)   Past Surgical History:  Procedure Laterality Date  . ABDOMINAL AORTOGRAM N/A 12/03/2016   Procedure: Abdominal Aortogram;  Surgeon: Angelia Mould, MD;  Location: [place] [facility];  Service: Cardiovascular;  Laterality: N/A;  . ABDOMINAL HYSTERECTOMY  12/1987  . ADENOIDECTOMY  1975  . ANKLE SURGERY Bilateral 1993; 1995 X2   "stabilzation done; 1 on the right; 2 on the left"  . APPENDECTOMY  1989  . BREAST EXCISIONAL BIOPSY Left   . BREAST LUMPECTOMY Left    "benign tumor"  . CARDIAC CATHETERIZATION N/A 01/31/2015   Procedure: Left Heart Cath  and Coronary Angiography;  Surgeon: Burnell Blanks, MD;  Location: [place] [facility];  Service: Cardiovascular;  Laterality: N/A;  . COLONOSCOPY    . DILATION AND CURETTAGE OF UTERUS    . ESOPHAGOGASTRODUODENOSCOPY    . LAPAROSCOPIC CHOLECYSTECTOMY  1996  . NASAL RECONSTRUCTION  1976  . NASAL SINUS SURGERY  1975  . POSTERIOR LUMBAR FUSION  2005  . RADIOLOGY WITH ANESTHESIA N/A 01/15/2016   Procedure: MRI LUMBAR SPINE WITHOUT;  Surgeon: Medication Radiologist, MD;  Location: [place];  Service: Radiology;  Laterality: N/A;  . RADIOLOGY WITH ANESTHESIA N/A 06/29/2016   Procedure: MRI OF THE BRAIN WITH AND WITHOUT;  Surgeon: Medication Radiologist, MD;  Location: [place];  Service: Radiology;  Laterality: N/A;  . RENAL ANGIOGRAPHY N/A 12/03/2016   Procedure: Renal Angiography;  Surgeon: Angelia Mould, MD;  Location: [place] [facility];  Service: Cardiovascular;  Laterality: N/A;  . TONSILLECTOMY  1992  . TUBAL LIGATION    . 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 amLODipine (NORVASC) 10 MG tablet Take 1 tablet (10 mg total) by mouth daily. (Patient taking differently: Take 10 mg by mouth every morning. )  . aspirin-acetaminophen-caffeine (EXCEDRIN EXTRA STRENGTH) 250-250-65 MG tablet Take 2 tablets by mouth daily as needed for headache or migraine.   . clopidogrel (PLAVIX) 75 MG tablet Take 75 mg by mouth every evening.   Marland Kitchen dexlansoprazole (DEXILANT) 60 MG capsule Take 1 tab by mouth every morning. (Patient taking differently: Take 60 mg by mouth every evening. )  . EPINEPHrine (EPIPEN 2-PAK) 0.3 mg/0.3 mL IJ SOAJ injection Inject 0.3 mg into the muscle once as needed (for allergic reaction).   . gabapentin (NEURONTIN) 600 MG tablet Take 600 mg by mouth 4 (four) times daily.  . hydrALAZINE (APRESOLINE) 100 MG tablet Take 1 tablet (100 mg total) by mouth 3 (three) times daily.  Marland Kitchen labetalol  (NORMODYNE) 200 MG tablet Take 1 tablet (200 mg total) by mouth 2 (two) times daily.  . metFORMIN (GLUCOPHAGE) 500 MG tablet Take 250 mg by mouth daily.  Marland Kitchen rOPINIRole (REQUIP) 4 MG tablet Take 4 mg by mouth at bedtime.  Marland Kitchen zolpidem (AMBIEN CR) 12.5 MG CR tablet Take 12.5 mg by mouth at bedtime.     Allergies:   Other; Penicillins; Zithromax [azithromycin]; Aspirin; Pineapple; Strawberry extract; Spironolactone; Aspartame and phenylalanine; Mushroom extract complex; and Nicardipine   Social History   Tobacco Use  . Smoking status: Current Every Day Smoker    Packs/day: 1.00    Years: 45.00    Pack years: 45.00  Types: Cigarettes    Start date: 03/04/1971  . Smokeless tobacco: Never Used  Substance Use Topics  . Alcohol use: No    Alcohol/week: 0.0 standard drinks  . Drug use: No     Family Hx: The patient's family history includes Allergic rhinitis in her daughter and father; Asthma in her daughter; Bipolar disorder in her brother and daughter; Cancer in her father; Coronary artery disease in her father; Depression in her mother; Diabetes in her maternal grandmother; Drug abuse in her brother; Emphysema in her father; Heart attack in her brother and sister; Heart attack (age of onset: 33) in her father; Leukemia in her brother; Liver disease in her cousin; Skin cancer in her mother; Stomach cancer in her maternal grandfather and another family member; Stroke in her father; Urticaria in her daughter. There is no history of Angioedema, Atopy, Eczema, Immunodeficiency, Breast cancer, or Colon cancer.  ROS:   Please see the history of present illness.     All other systems reviewed and are negative.   Prior CV studies:   The following studies were reviewed today:  Echocardiogram: 05/2017 Study Conclusions  - Left ventricle: The cavity size was normal. Systolic function was vigorous. The estimated ejection fraction was in the range of 65% to 70%. Wall motion was normal; there  were no regional wall motion abnormalities. Doppler parameters are consistent with abnormal left ventricular relaxation (grade 1 diastolic dysfunction). Doppler parameters are consistent with high ventricular filling pressure. Mild concentric and mild to moderate focal basal septal hypertrophy.   Coronary CT: 03/22/2018 Coronary Arteries: Right dominant with no anomalies  LM: No plaque or stenosis.  LAD system: No plaque or stenosis.  Circumflex system: No plaque or stenosis.  RCA system: Small area of calcified plaque mid RCA, no significant stenosis.  IMPRESSION: 1. Coronary artery calcium [name] 3.7 Agatston units, placing the patient in the 74th percentile for age and gender, suggesting intermediate risk for future cardiac events.  2. No obstructive coronary disease  Labs/Other Tests and Data Reviewed:    EKG:  No ECG reviewed.  Recent Labs: 02/06/2018: Magnesium 2.2 05/01/2018: TSH 6.752 06/19/2018: ALT 15 08/11/2018: B Natriuretic Peptide 44.0; BUN 15; Creatinine, Ser 0.77; Hemoglobin 13.5; Platelets 276; Potassium 3.5; Sodium 138   Recent Lipid Panel Lab Results  Component Value Date/Time   CHOL 140 08/21/2017 05:25 AM   TRIG 142 08/21/2017 05:25 AM   HDL 30 (L) 08/21/2017 05:25 AM   CHOLHDL 4.7 08/21/2017 05:25 AM   LDLCALC 82 08/21/2017 05:25 AM    Wt Readings from Last 3 Encounters:  11/13/18 233 lb (105.7 kg)  08/11/18 225 lb (102.1 kg)  07/20/18 222 lb (100.7 kg)     Objective:    Vital Signs:  BP (!) 187/82   Pulse 94   Ht 5\' 7"  (1.702 m)   Wt 233 lb (105.7 kg)   SpO2 97%   BMI 36.49 kg/m    VITAL SIGNS:  reviewed  ASSESSMENT & PLAN:    1. Accelerated hypertension with endorgan damage (history of stroke): Blood pressure issignificantlyelevated. She was hospitalized in the fall 2019 hypertensive emergency due to medication and dietary noncompliance. She always claims to be compliant with medications.  Psychiatry was  consulted during hospitalization in the fall 2019 due to her noncooperative behavior and she was felt to be at high risk for readmission because of adjustment disorder with emotional disturbance and medical noncompliance. She is currently taking hydralazine 100 mg TID, labetalol 200 mg bid, and  amlodipine 10 mg daily. I will start clonidine 0.1 mg twice daily.  2. History of CVA: She is on Plavix.  Blood pressure is elevated. I will start clonidine 0.1 mg twice daily.     COVID-19 Education: The signs and symptoms of COVID-19 were discussed with the patient and how to seek care for testing (follow up with PCP or arrange E-visit).  The importance of social distancing was discussed today.  Time:   Today, I have spent 15 minutes with the patient with telehealth technology discussing the above problems.     Medication Adjustments/Labs and Tests Ordered: Current medicines are reviewed at length with the patient today.  Concerns regarding medicines are outlined above.   Tests Ordered: No orders of the defined types were placed in this encounter.   Medication Changes: No orders of the defined types were placed in this encounter.   Disposition:  Follow up in 6 month(s)  Signed, Kate Sable, MD  11/13/2018 11:49 AM    [place]

## 2018-11-13 NOTE — Patient Instructions (Signed)
Medication Instructions: START Clonidine 0.1 mg twice a day  Labwork: none  Procedures/Testing: none  Follow-Up: 6 months with [place], PA-C  Any Additional Special Instructions Will Be Listed Below (If Applicable).     If you need a refill on your cardiac medications before your next appointment, please call your pharmacy.     Thank you for choosing [facility] !

## 2018-11-13 NOTE — Addendum Note (Signed)
Addended by: Barbarann Ehlers A on: 11/13/2018 11:58 AM   Modules accepted: Orders

## 2018-11-21 IMAGING — DX DG CHEST 2V
2 series · 2 of 2 positions shown · non-contrast
Comparison: 11/27/2016

CLINICAL DATA: Left-sided chest heaviness and dyspnea, onset today

EXAM:
CHEST  2 VIEW

[chest pa]
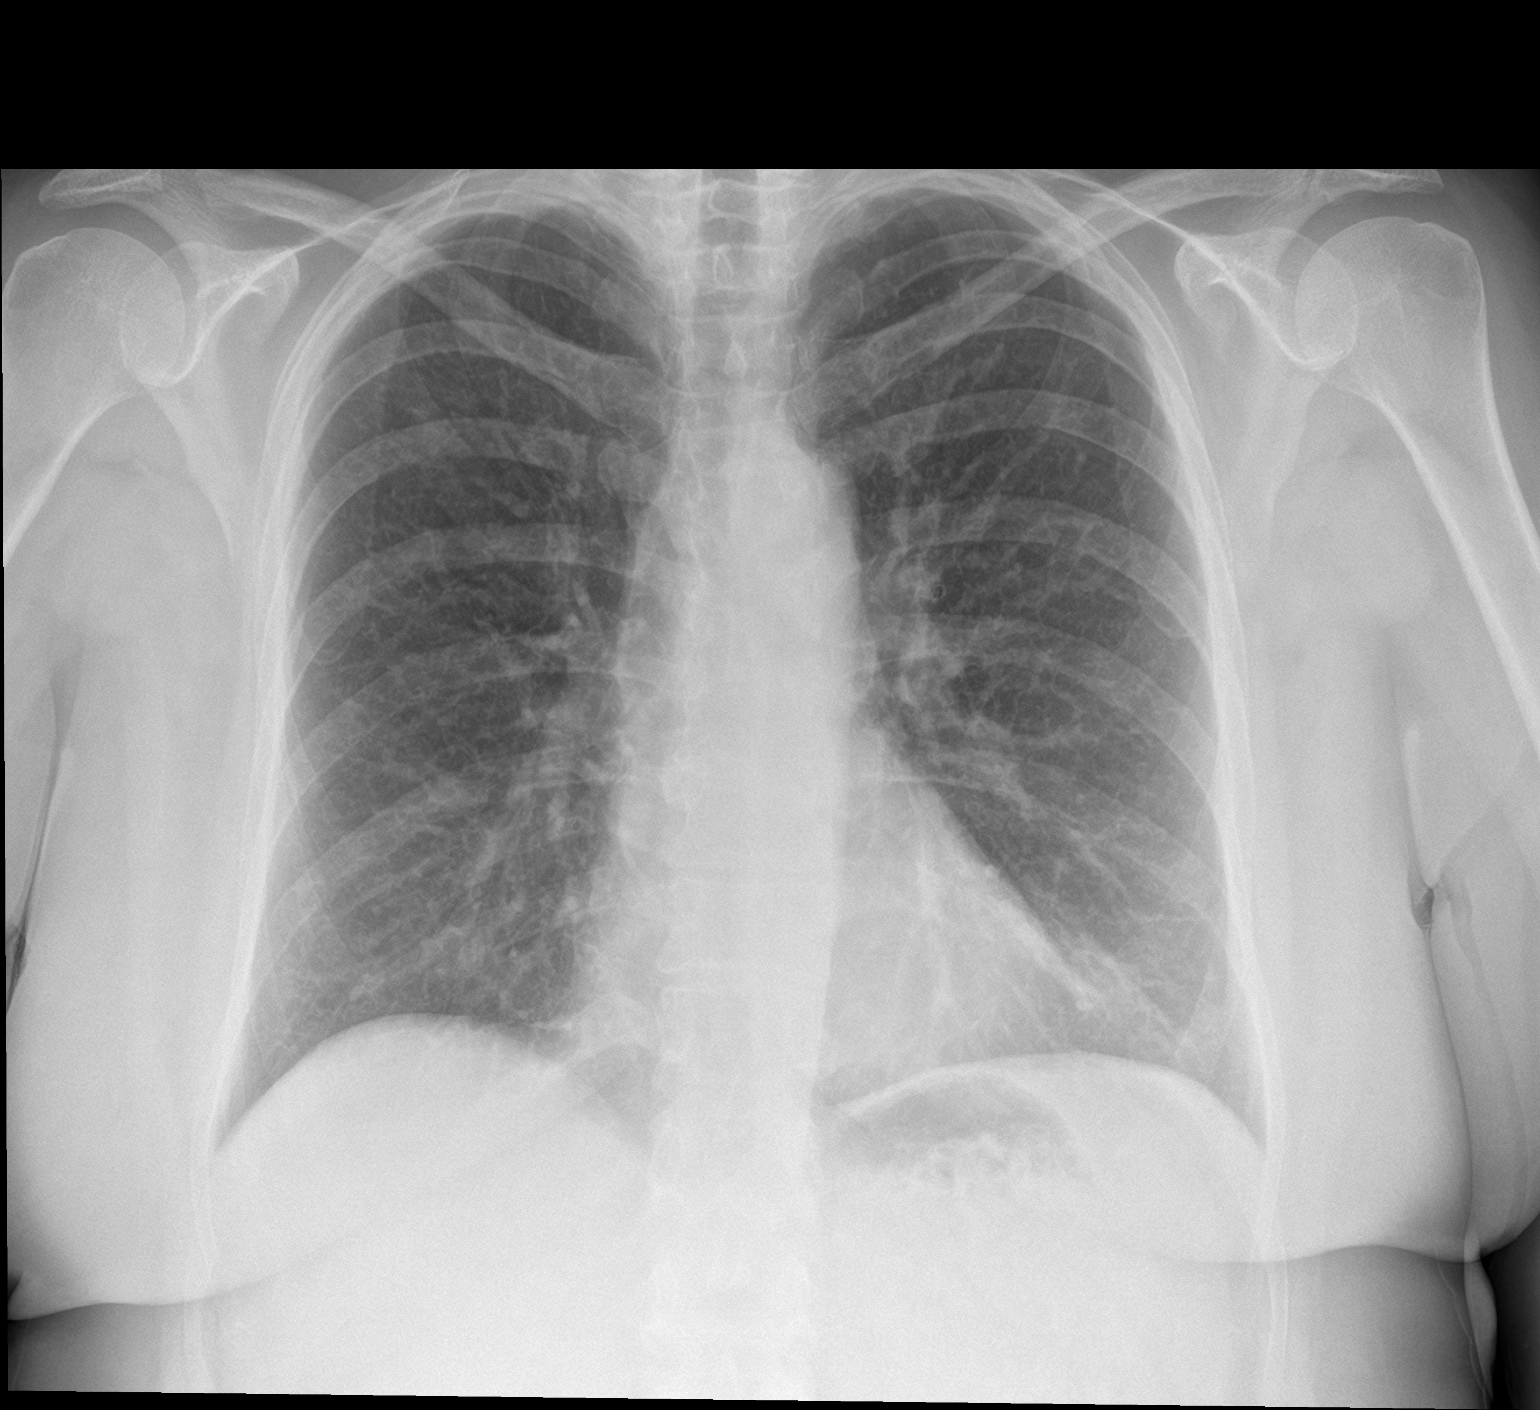

[chest lat]
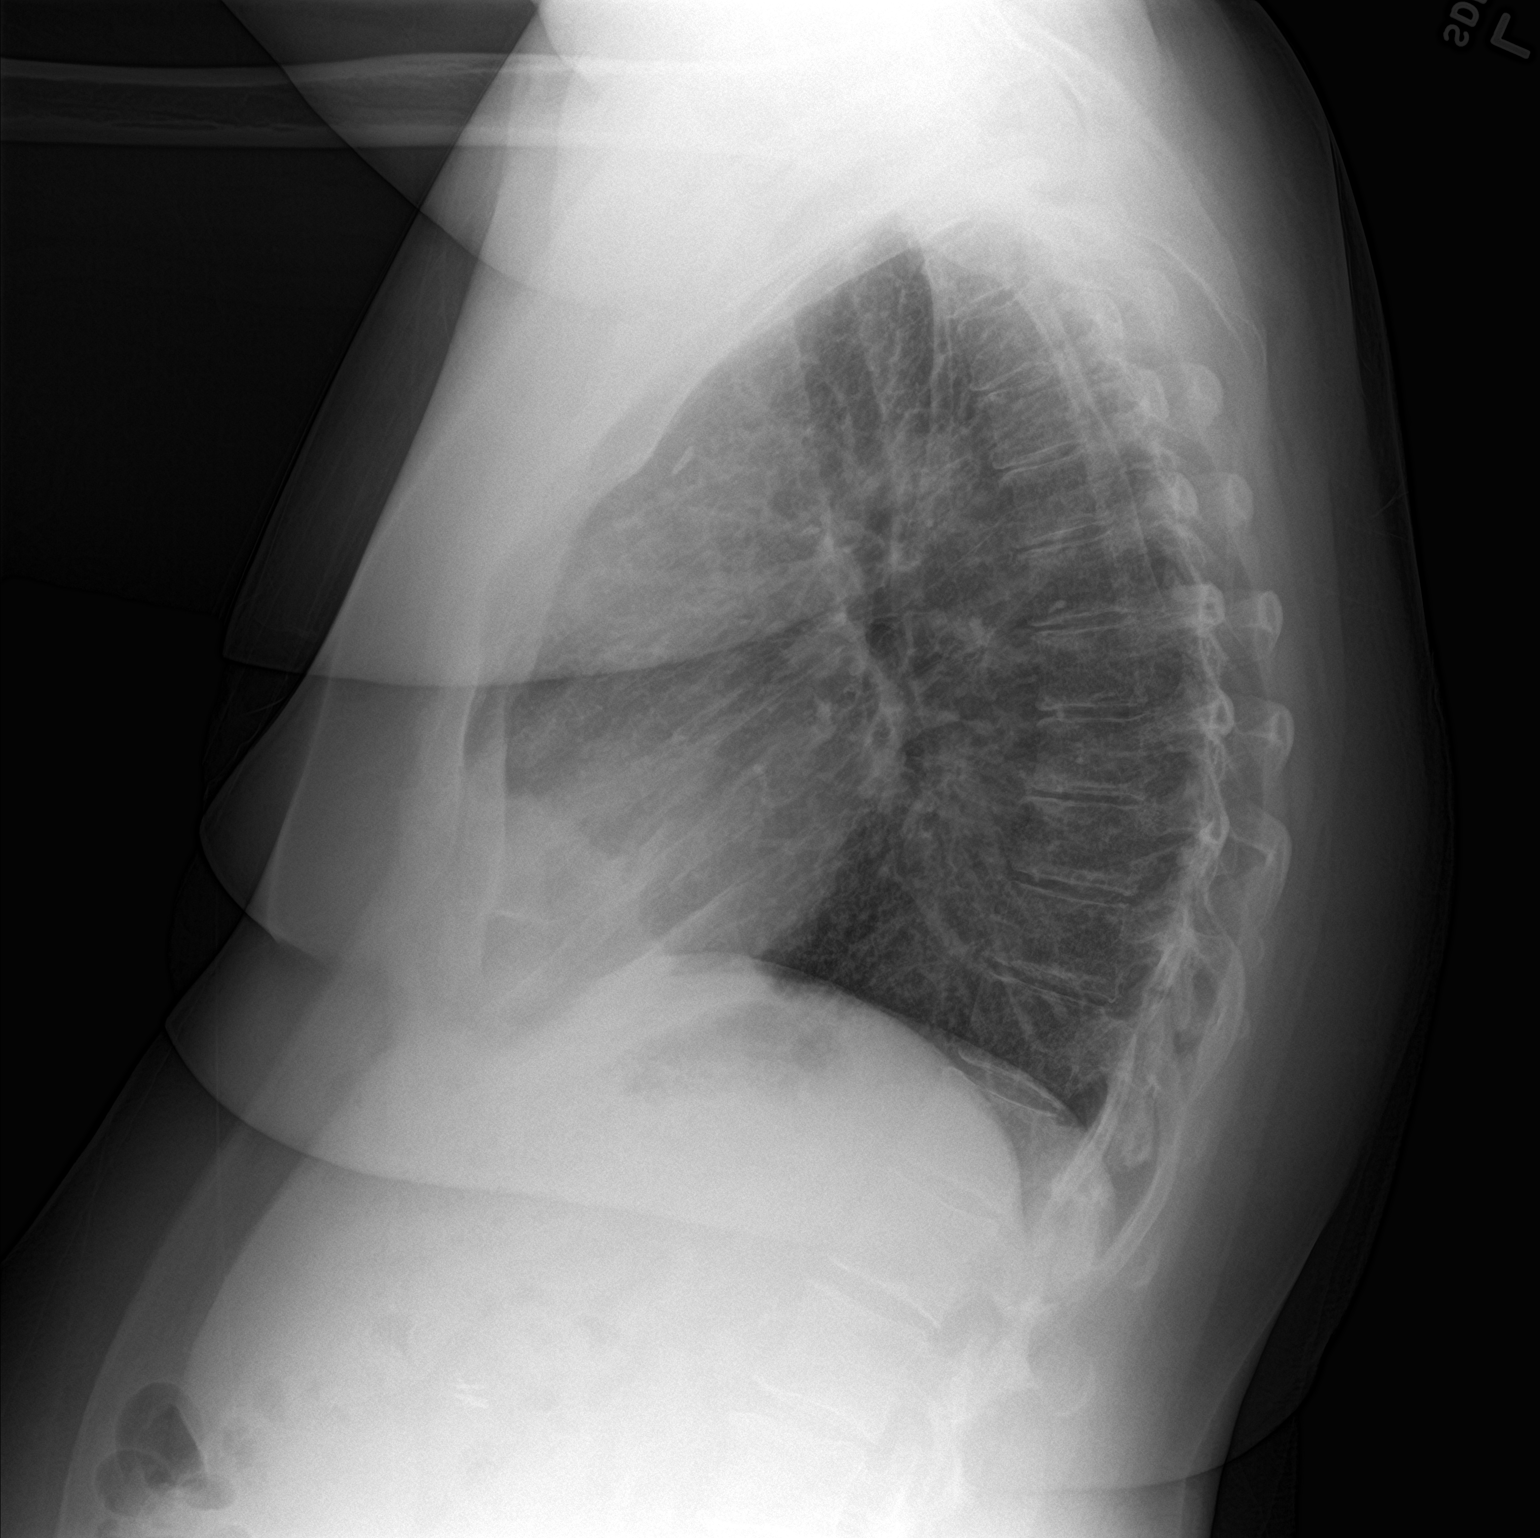

[2 of 2 positions shown; findings below may reference images not displayed]

FINDINGS: Mild interstitial coarsening. No airspace consolidation. No
effusion. Normal heart size. Unremarkable hilar and mediastinal
contours.
IMPRESSION: Mild interstitial coarsening.  No consolidation or effusion.

## 2018-11-27 ENCOUNTER — Emergency Department (HOSPITAL_COMMUNITY)
Admission: EM | Admit: 2018-11-27 | Discharge: 2018-11-28 | Disposition: A | Discharge status: Home / Self Care | Payer: Medicare Other | Attending: Emergency Medicine | Admitting: Emergency Medicine

## 2018-11-27 ENCOUNTER — Emergency Department (HOSPITAL_COMMUNITY): Payer: Medicare Other

## 2018-11-27 ENCOUNTER — Other Ambulatory Visit: Payer: Self-pay

## 2018-11-27 DIAGNOSIS — Z8673 Personal history of transient ischemic attack (TIA), and cerebral infarction without residual deficits: Secondary | ICD-10-CM | POA: Diagnosis not present

## 2018-11-27 DIAGNOSIS — J449 Chronic obstructive pulmonary disease, unspecified: Secondary | ICD-10-CM | POA: Diagnosis not present

## 2018-11-27 DIAGNOSIS — I10 Essential (primary) hypertension: Secondary | ICD-10-CM | POA: Insufficient documentation

## 2018-11-27 DIAGNOSIS — R0789 Other chest pain: Secondary | ICD-10-CM | POA: Diagnosis not present

## 2018-11-27 DIAGNOSIS — Z79899 Other long term (current) drug therapy: Secondary | ICD-10-CM | POA: Diagnosis not present

## 2018-11-27 DIAGNOSIS — J45909 Unspecified asthma, uncomplicated: Secondary | ICD-10-CM | POA: Diagnosis not present

## 2018-11-27 DIAGNOSIS — Z7984 Long term (current) use of oral hypoglycemic drugs: Secondary | ICD-10-CM | POA: Diagnosis not present

## 2018-11-27 DIAGNOSIS — Z7902 Long term (current) use of antithrombotics/antiplatelets: Secondary | ICD-10-CM | POA: Insufficient documentation

## 2018-11-27 DIAGNOSIS — E119 Type 2 diabetes mellitus without complications: Secondary | ICD-10-CM | POA: Diagnosis not present

## 2018-11-27 DIAGNOSIS — R079 Chest pain, unspecified: Secondary | ICD-10-CM | POA: Diagnosis present

## 2018-11-27 MED ORDER — SODIUM CHLORIDE 0.9% FLUSH: 3.0000 mL | Freq: Once | INTRAVENOUS | Status: DC

## 2018-11-27 NOTE — ED Triage Notes (Signed)
Pt with left cp that radiates into left shoulder for an hour. + nausea +sob, denies emesis or diaphoresis. Hx of same. Pt states hx of MI but doesn't remember the pain was like.

## 2018-11-28 DIAGNOSIS — R0789 Other chest pain: Secondary | ICD-10-CM | POA: Diagnosis not present

## 2018-11-28 LAB — CBC
HCT: 43.6 % (ref 36.0–46.0)
Hemoglobin: 13.7 g/dL (ref 12.0–15.0)
MCH: 29 pg (ref 26.0–34.0)
MCHC: 31.4 g/dL (ref 30.0–36.0)
MCV: 92.2 fL (ref 80.0–100.0)
Platelets: 259 10*3/uL (ref 150–400)
RBC: 4.73 MIL/uL (ref 3.87–5.11)
RDW: 14.6 % (ref 11.5–15.5)
WBC: 10.5 10*3/uL (ref 4.0–10.5)
nRBC: 0 % (ref 0.0–0.2)

## 2018-11-28 LAB — BASIC METABOLIC PANEL
Anion gap: 9 (ref 5–15)
BUN: 22 mg/dL — ABNORMAL HIGH (ref 6–20)
CO2: 27 mmol/L (ref 22–32)
Calcium: 9.3 mg/dL (ref 8.9–10.3)
Chloride: 107 mmol/L (ref 98–111)
Creatinine, Ser: 0.79 mg/dL (ref 0.44–1.00)
GFR calc Af Amer: >60 mL/min (ref >60)
GFR calc non Af Amer: >60 mL/min (ref >60)
Glucose, Bld: 99 mg/dL (ref 70–99)
Potassium: 3.9 mmol/L (ref 3.5–5.1)
Sodium: 143 mmol/L (ref 135–145)

## 2018-11-28 LAB — TROPONIN I
Troponin I: <0.03 ng/mL (ref <0.03)
Troponin I: <0.03 ng/mL (ref <0.03)

## 2018-11-28 MED ORDER — OXYCODONE-ACETAMINOPHEN 5-325 MG PO TABS
1.0000 | ORAL_TABLET | Freq: Once | ORAL | Status: AC
  Administered 2018-11-28: 1 via ORAL
  Filled 2018-11-28: qty 1

## 2018-11-28 NOTE — ED Provider Notes (Signed)
[facility] EMERGENCY DEPARTMENT Provider Note   CSN: [id number] Arrival date & time: 11/27/18  2120    History   Chief Complaint Chief Complaint  Patient presents with   Chest Pain    HPI Katrina [name] is a 58 y.o. female.     Patient presents to the emergency department for evaluation of chest pain.  Patient reports that she started noticing increased pain in the left chest with shortness of breath approximately an hour before coming to the ER.  This pain, however, does happen for her frequently.  Tonight she has noticed that movement causes the pain to worsen.     Past Medical History:  Diagnosis Date   Allergy    Anemia 1975-1976    Anxiety    takes Valium daily as needed   Arthritis    "spine" (12/03/2016)   Asthma    has inhalers but doesn't use (12/03/2016)   Chronic bronchitis ([place])    "get it a couple times q yr" (12/03/2016)   Chronic kidney disease    Chronic lower back pain    budlging disc    Claustrophobia    COPD (chronic obstructive pulmonary disease) (HCC)    Daily headache    Depression    Diverticulitis    Family history of adverse reaction to anesthesia    2 daughters gets extremely sick    GERD (gastroesophageal reflux disease)    takes Dexilant daily   Heart murmur    History of blood transfusion 1975-1976 "several"   no abnormal reaction noted   History of colitis    History of colon polyps    benign   History of gastric ulcer    History of hiatal hernia    "small one"   History of MRSA infection 2017   Hyperlipidemia    was on meds but has been off over a yr   Hypertension    takes Amlodipine and Maxzide daily   Insomnia    takes Ambien nightly   Iron deficiency anemia    "when I was young"   Lung nodules    Migraine    "2-3/wk" (12/03/2016)   MS (multiple sclerosis) ([place])    questionable per pt   Noncompliance    Osteoporosis    Peripheral neuropathy    weakness,numbness,and tingling.  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% 02/09/2018   Impaired insight 02/08/2018   Adjustment disorder with disturbance of emotion 02/08/2018   Left lower quadrant abdominal pain affecting pregnancy 12/21/2017   Chest pain in adult 08/20/2017   Abnormal brain MRI 08/08/2017   Weakness of left leg 05/19/2017   Hypertension 12/03/2016   Adverse food reaction 10/14/2016   Pulmonary nodules 10/14/2016   Non-seasonal allergic rhinitis due to fungal spores 10/14/2016   Cigarette smoker 09/24/2016   Cerebral microvascular disease 08/19/2016   Non compliance with medical treatment 05/11/2016   Leg pain, anterior, right 05/11/2016   Syncope 05/02/2016   Type 2 diabetes mellitus with  diabetic neuropathy ([place]) 05/02/2016   Restless leg syndrome 05/02/2016   History of CVA with residual deficit    Vascular headache    History of chest pain    Generalized anxiety disorder    Chronic bilateral low back pain without sciatica    Peripheral neuropathy    Migraine without status migrainosus, not intractable    PTSD (post-traumatic stress disorder)    History of cerebrovascular accident (CVA) with residual deficit    CVA (cerebral vascular accident) ([place]) 04/03/2016   Stroke (cerebrum) ([place]) 04/03/2016   Post traumatic stress disorder 10/06/2015   Precordial pain    Migraine syndrome 01/24/2015   Migraine 01/24/2015   TIA (transient ischemic attack) 12/25/2014   Hypertensive urgency    Atypical  chest pain    Malignant hypertension    Hypokalemia 04/16/2014   Muscle weakness (generalized) 04/05/2014   Stiffness of left hip joint 04/05/2014   Pain in left hip 06/15/3233   Diastolic dysfunction 57/32/2025   Normal coronary arteries 03/20/2014   PUD (peptic ulcer disease) 03/20/2014   Type 2 diabetes, uncontrolled, with neuropathy ([place]) 03/20/2014   Morbid obesity due to excess calories ([place]) 03/20/2014   Left-sided weakness 03/20/2014   Paresthesias 03/20/2014   Cerebrovascular disease 03/20/2014   Dyspnea on exertion 03/05/2012   Tobacco abuse 03/05/2012   Moderate COPD (chronic obstructive pulmonary disease) ([place]) 11/23/2011   GERD (gastroesophageal reflux disease) 11/23/2011   Uncontrolled hypertension 11/23/2011    Past Surgical History:  Procedure Laterality Date   ABDOMINAL AORTOGRAM N/A 12/03/2016   Procedure: Abdominal Aortogram;  Surgeon: Angelia Mould, MD;  Location: [facility];  Service: Cardiovascular;  Laterality: N/A;   ABDOMINAL HYSTERECTOMY  12/1987   ADENOIDECTOMY  1975   ANKLE SURGERY Bilateral 1993; 1995 X2   "stabilzation done; 1 on the right; 2 on the left"   APPENDECTOMY  1989   BREAST EXCISIONAL BIOPSY Left    BREAST LUMPECTOMY Left    "benign tumor"   CARDIAC CATHETERIZATION N/A 01/31/2015   Procedure: Left Heart Cath and Coronary Angiography;  Surgeon: Burnell Blanks, MD;  Location: [facility];  Service: Cardiovascular;  Laterality: N/A;   COLONOSCOPY     DILATION AND CURETTAGE OF UTERUS     ESOPHAGOGASTRODUODENOSCOPY     LAPAROSCOPIC CHOLECYSTECTOMY  1996   NASAL RECONSTRUCTION  1976   NASAL SINUS SURGERY  1975   POSTERIOR LUMBAR FUSION  2005   RADIOLOGY WITH ANESTHESIA N/A 01/15/2016   Procedure: MRI LUMBAR SPINE WITHOUT;  Surgeon: Medication Radiologist, MD;  Location: [facility];  Service: Radiology;  Laterality: N/A;   RADIOLOGY WITH ANESTHESIA N/A 06/29/2016   Procedure: MRI  OF THE BRAIN WITH AND WITHOUT;  Surgeon: Medication Radiologist, MD;  Location: [facility];  Service: Radiology;  Laterality: N/A;   RENAL ANGIOGRAPHY N/A 12/03/2016   Procedure: Renal Angiography;  Surgeon: Angelia Mould, MD;  Location: [facility];  Service: Cardiovascular;  Laterality: N/A;   TONSILLECTOMY  1992   TUBAL LIGATION     TUMOR EXCISION  1998   from back of skull; developed;  MRSA from the area that had to be packed     OB History   No obstetric history on file.      Home Medications    Prior to Admission medications   Medication Sig Start Date End Date Taking? Authorizing Provider  amLODipine (NORVASC) 10 MG tablet Take 1 tablet (10 mg total) by mouth daily. Patient taking differently: Take  10 mg by mouth every morning.  06/05/14   Herminio Commons, MD  aspirin-acetaminophen-caffeine (EXCEDRIN EXTRA STRENGTH) [phone] MG tablet Take 2 tablets by mouth daily as needed for headache or migraine.     [provider]  cloNIDine (CATAPRES) 0.1 MG tablet Take 1 tablet (0.1 mg total) by mouth 2 (two) times daily. 11/13/18   Herminio Commons, MD  clopidogrel (PLAVIX) 75 MG tablet Take 75 mg by mouth every evening.     [provider]  dexlansoprazole (DEXILANT) 60 MG capsule Take 1 tab by mouth every morning. Patient taking differently: Take 60 mg by mouth every evening.  10/18/16   Esterwood, Amy S, PA-C  EPINEPHrine (EPIPEN 2-PAK) 0.3 mg/0.3 mL IJ SOAJ injection Inject 0.3 mg into the muscle once as needed (for allergic reaction).     [provider]  gabapentin (NEURONTIN) 600 MG tablet Take 600 mg by mouth 4 (four) times daily.    [provider]  hydrALAZINE (APRESOLINE) 100 MG tablet Take 1 tablet (100 mg total) by mouth 3 (three) times daily. 01/30/18   Herminio Commons, MD  labetalol (NORMODYNE) 200 MG tablet Take 1 tablet (200 mg total) by mouth 2 (two) times daily. 10/19/18   Herminio Commons, MD  metFORMIN  (GLUCOPHAGE) 500 MG tablet Take 250 mg by mouth daily. 05/18/18   [provider]  rOPINIRole (REQUIP) 4 MG tablet Take 4 mg by mouth at bedtime.    [provider]  zolpidem (AMBIEN CR) 12.5 MG CR tablet Take 12.5 mg by mouth at bedtime. 05/16/17   [provider]    Family History Family History  Problem Relation Age of Onset   Coronary artery disease Father    Emphysema Father    Heart attack Father 8       Died age 51   Stroke Father    Cancer Father        Unsure of type    Allergic rhinitis Father    Depression Mother    Skin cancer Mother    Bipolar disorder Brother    Drug abuse Brother    Leukemia Brother    Bipolar disorder Daughter    Allergic rhinitis Daughter    Asthma Daughter    Urticaria Daughter    Diabetes Maternal Grandmother    Stomach cancer Maternal Grandfather    Heart attack Sister        Died in 37s   Heart attack Brother        Died age 32   Stomach cancer Other    Liver disease Cousin    Angioedema Neg Hx    Atopy Neg Hx    Eczema Neg Hx    Immunodeficiency Neg Hx    Breast cancer Neg Hx    Colon cancer Neg Hx     Social History Social History   Tobacco Use   Smoking status: Current Every Day Smoker    Packs/day: 1.00    Years: 45.00    Pack years: 45.00    Types: Cigarettes    Start date: 03/04/1971   Smokeless tobacco: Never Used  Substance Use Topics   Alcohol use: No    Alcohol/week: 0.0 standard drinks   Drug use: No     Allergies   Other, Penicillins, Zithromax [azithromycin], Aspirin, Pineapple, Strawberry extract, Spironolactone, Aspartame and phenylalanine, Mushroom extract complex, and Nicardipine   Review of Systems Review of Systems  Respiratory: Positive for shortness of breath.   Cardiovascular:  Positive for chest pain.  All other systems reviewed and are negative.    Physical Exam Updated Vital Signs BP (!) 173/82    Pulse 67    Temp 98.6 F (37  C) (Oral)    Resp 20    Ht 5\' 7"  (1.702 m)    Wt 104.3 kg    SpO2 95%    BMI 36.02 kg/m   Physical Exam Vitals signs and nursing note reviewed.  Constitutional:      General: She is not in acute distress.    Appearance: Normal appearance. She is well-developed.  HENT:     Head: Normocephalic and atraumatic.     Right Ear: Hearing normal.     Left Ear: Hearing normal.     Nose: Nose normal.  Eyes:     Conjunctiva/sclera: Conjunctivae normal.     Pupils: Pupils are equal, round, and reactive to light.  Neck:     Musculoskeletal: Normal range of motion and neck supple.  Cardiovascular:     Rate and Rhythm: Regular rhythm.     Heart sounds: S1 normal and S2 normal. No murmur. No friction rub. No gallop.   Pulmonary:     Effort: Pulmonary effort is normal. No respiratory distress.     Breath sounds: Normal breath sounds.  Chest:     Chest wall: Tenderness present.    Abdominal:     General: Bowel sounds are normal.     Palpations: Abdomen is soft.     Tenderness: There is no abdominal tenderness. There is no guarding or rebound. Negative signs include Murphy's sign and McBurney's sign.     Hernia: No hernia is present.  Musculoskeletal: Normal range of motion.  Skin:    General: Skin is warm and dry.     Findings: No rash.  Neurological:     Mental Status: She is alert and oriented to person, place, and time.     GCS: GCS eye subscore is 4. GCS verbal subscore is 5. GCS motor subscore is 6.     Cranial Nerves: No cranial nerve deficit.     Sensory: No sensory deficit.     Coordination: Coordination normal.  Psychiatric:        Speech: Speech normal.        Behavior: Behavior normal.        Thought Content: Thought content normal.      ED Treatments / Results  Labs (all labs ordered are listed, but only abnormal results are displayed) Labs Reviewed  BASIC METABOLIC PANEL - Abnormal; Notable for the following components:      Result Value   BUN 22 (*)    All other  components within normal limits  CBC  TROPONIN I  TROPONIN I    EKG None  Radiology Dg Chest 2 View  Result Date: 11/27/2018 CLINICAL DATA:  58 year old female with chest pain. EXAM: CHEST - 2 VIEW COMPARISON:  Chest radiograph dated 08/11/2018 FINDINGS: The heart size and mediastinal contours are within normal limits. Both lungs are clear. The visualized skeletal structures are unremarkable. IMPRESSION: No active cardiopulmonary disease. Electronically Signed   By: Anner Crete M.D.   On: 11/27/2018 22:05    Procedures Procedures (including critical care time)  Medications Ordered in ED Medications  sodium chloride flush (NS) 0.9 % injection 3 mL (3 mLs Intravenous Not Given 11/28/18 0139)  oxyCODONE-acetaminophen (PERCOCET/ROXICET) 5-325 MG per tablet 1 tablet (1 tablet Oral Given 11/28/18 0150)     Initial Impression /  Assessment and Plan / ED Course  I have reviewed the triage vital signs and the nursing notes.  Pertinent labs & imaging results that were available during my care of the patient were reviewed by me and considered in my medical decision making (see chart for details).        Patient presents to the emergency department for evaluation of chest pain.  She has a history of frequent chest pain which has been atypical in nature and felt to be noncardiac in the past.  Pain is atypical again today, very reproducible with palpation.  She also notices worsening of pain with movements of the torso.  This is most consistent with musculoskeletal pain.  Work-up today includes normal EKG, troponin x2.  As she is felt to be low risk for this being cardiac in etiology, appropriate for discharge and outpatient follow-up.  Final Clinical Impressions(s) / ED Diagnoses   Final diagnoses:  Chest wall pain    ED Discharge Orders    None       Orpah Greek, MD 11/28/18 [id number]

## 2018-11-28 NOTE — ED Notes (Signed)
Pt ambulatory to bathroom and back to room; pt says her chest pain had decreased and was better until she got up and moving around. Facial grimace noted with palpation of  left chest

## 2018-11-29 ENCOUNTER — Other Ambulatory Visit (HOSPITAL_COMMUNITY): Payer: Self-pay | Admitting: Family Medicine

## 2018-11-29 ENCOUNTER — Ambulatory Visit (HOSPITAL_COMMUNITY)
Admission: RE | Admit: 2018-11-29 | Discharge: 2018-11-29 | Disposition: A | Discharge status: Home / Self Care | Payer: Medicare Other | Source: Ambulatory Visit | Attending: Family Medicine | Admitting: Family Medicine

## 2018-11-29 ENCOUNTER — Other Ambulatory Visit: Payer: Self-pay

## 2018-11-29 DIAGNOSIS — M25512 Pain in left shoulder: Secondary | ICD-10-CM

## 2018-12-20 IMAGING — DX DG CHEST 2V
2 series · 2 of 2 positions shown · non-contrast
Comparison: 03/19/2017

CLINICAL DATA: Nonproductive cough 2 weeks with congestion and
wheezing.

EXAM:
CHEST  2 VIEW

[chest pa]
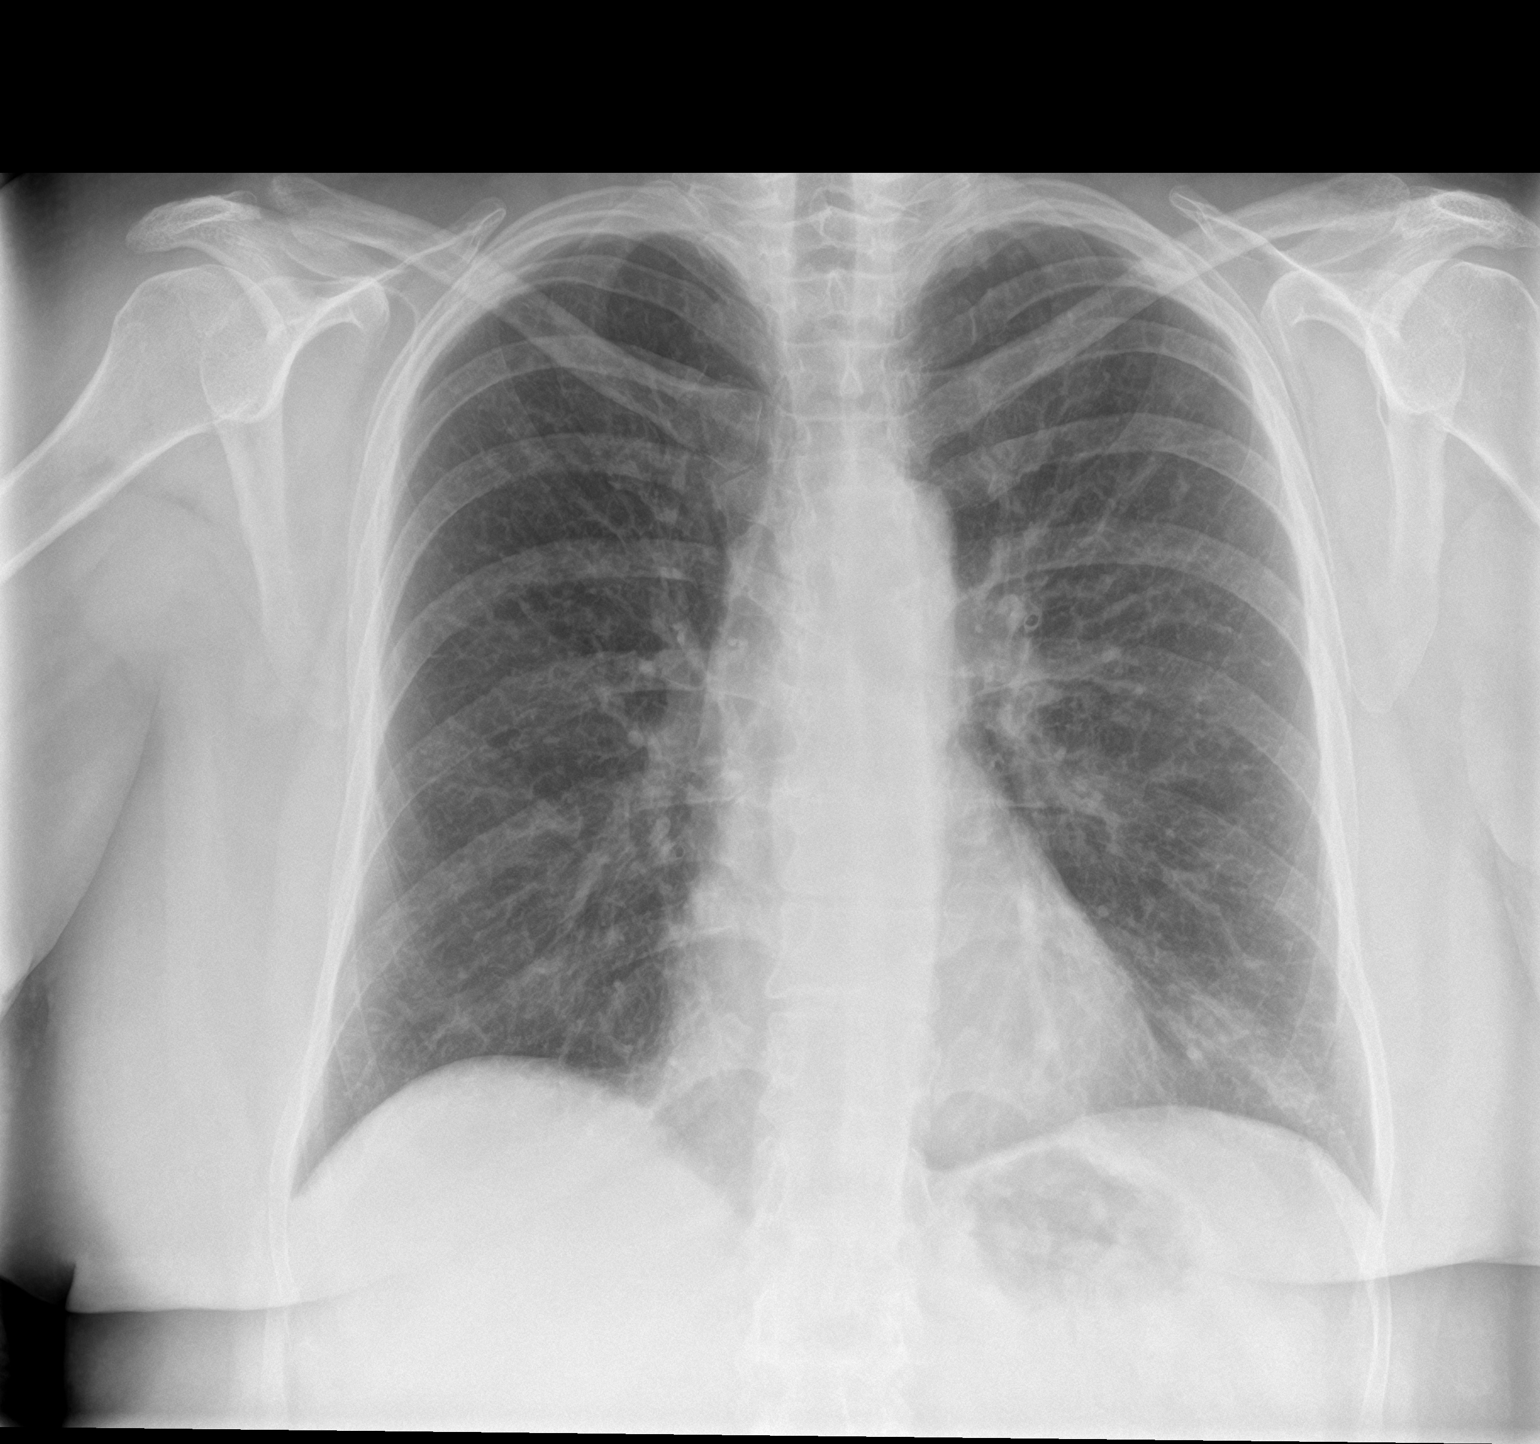

[chest lat]
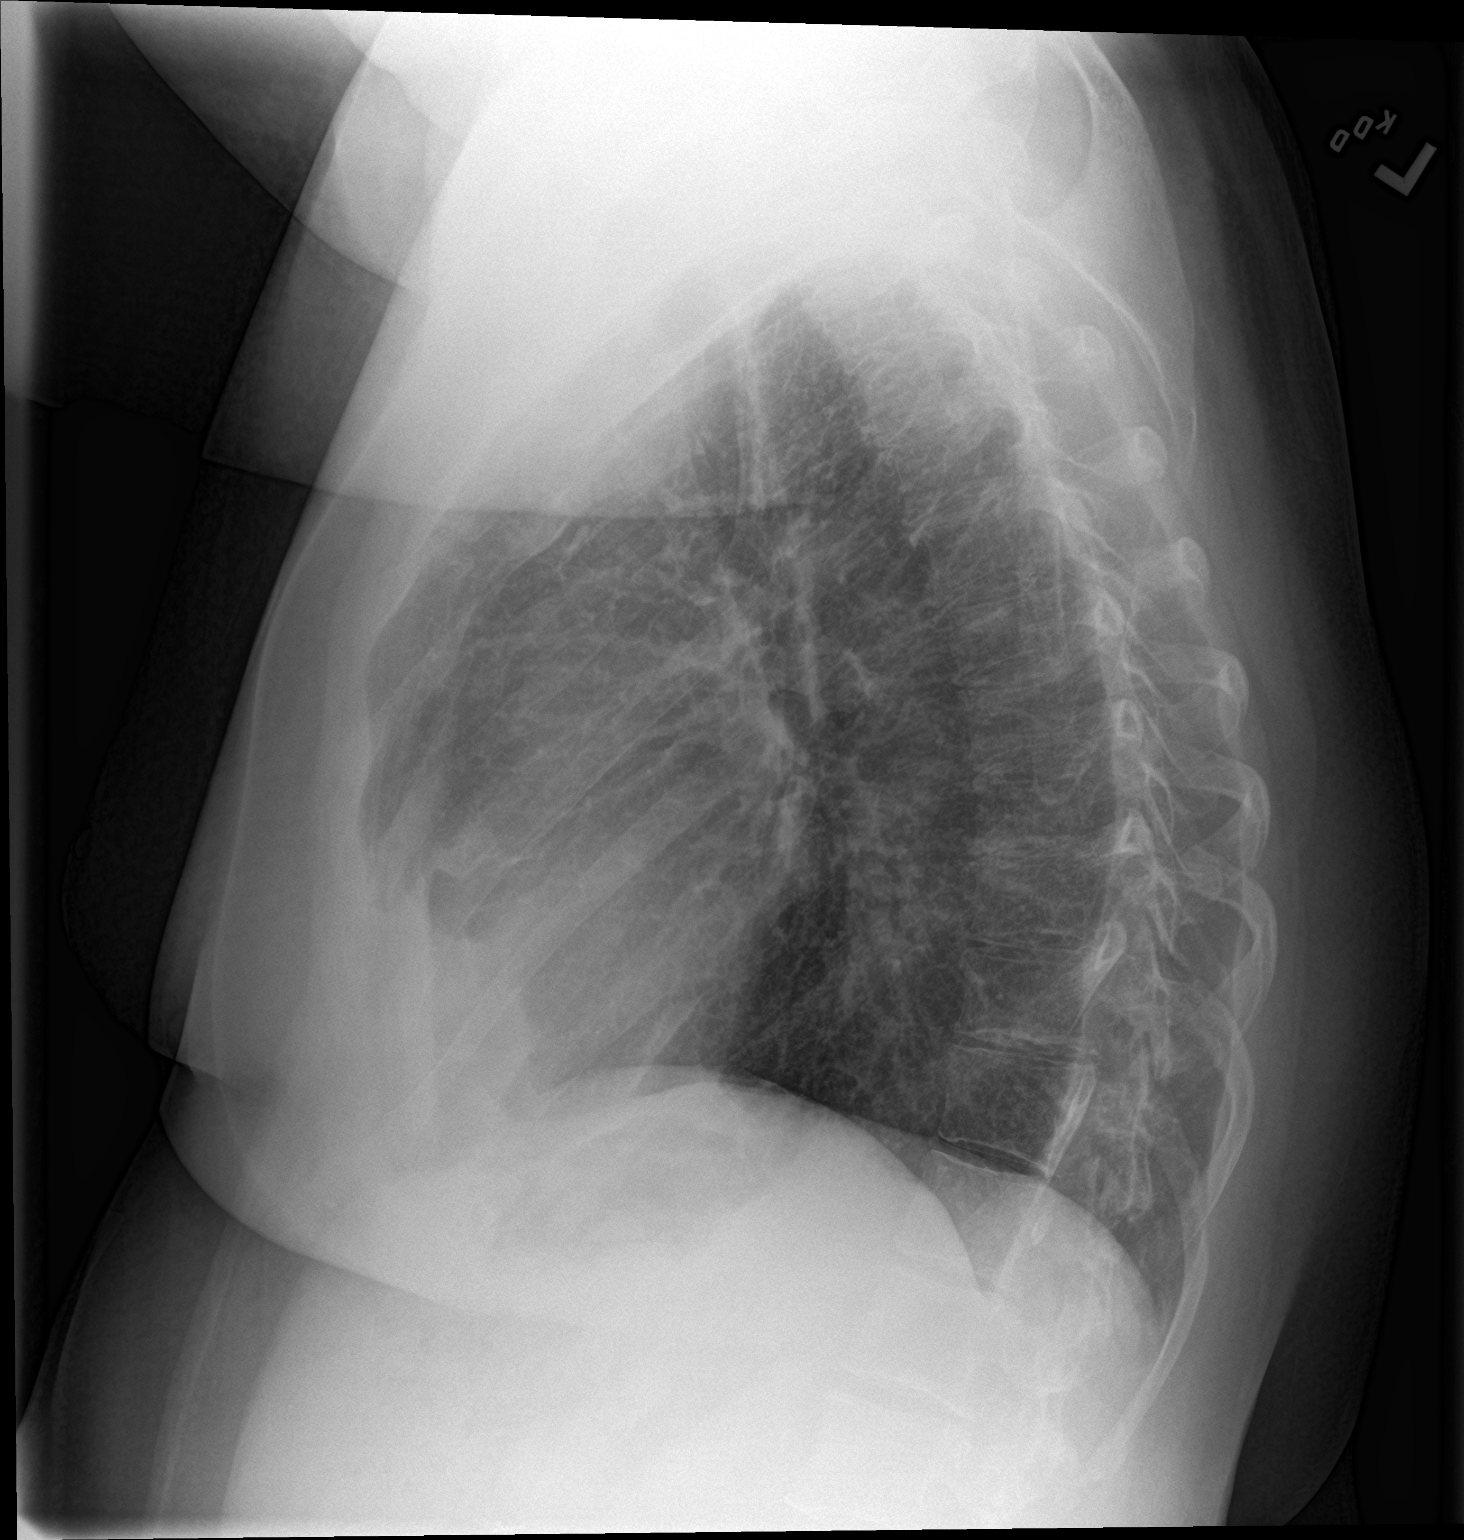

[2 of 2 positions shown; findings below may reference images not displayed]

FINDINGS: Lungs are adequately inflated without focal consolidation or
effusion. Cardiomediastinal silhouette is within normal. There are
mild degenerative changes of the spine.
IMPRESSION: No active cardiopulmonary disease.

## 2018-12-28 ENCOUNTER — Other Ambulatory Visit (HOSPITAL_COMMUNITY): Payer: Self-pay | Admitting: Family Medicine

## 2018-12-28 DIAGNOSIS — Z1231 Encounter for screening mammogram for malignant neoplasm of breast: Secondary | ICD-10-CM

## 2018-12-28 IMAGING — DX DG HIP (WITH OR WITHOUT PELVIS) 2-3V*R*
3 series · 3 of 3 positions shown · non-contrast
Comparison: Prior radiograph from 05/02/2016.

CLINICAL DATA: Initial evaluation for acute trauma, fall.

EXAM:
DG HIP (WITH OR WITHOUT PELVIS) 2-3V RIGHT

[pelvis ap]
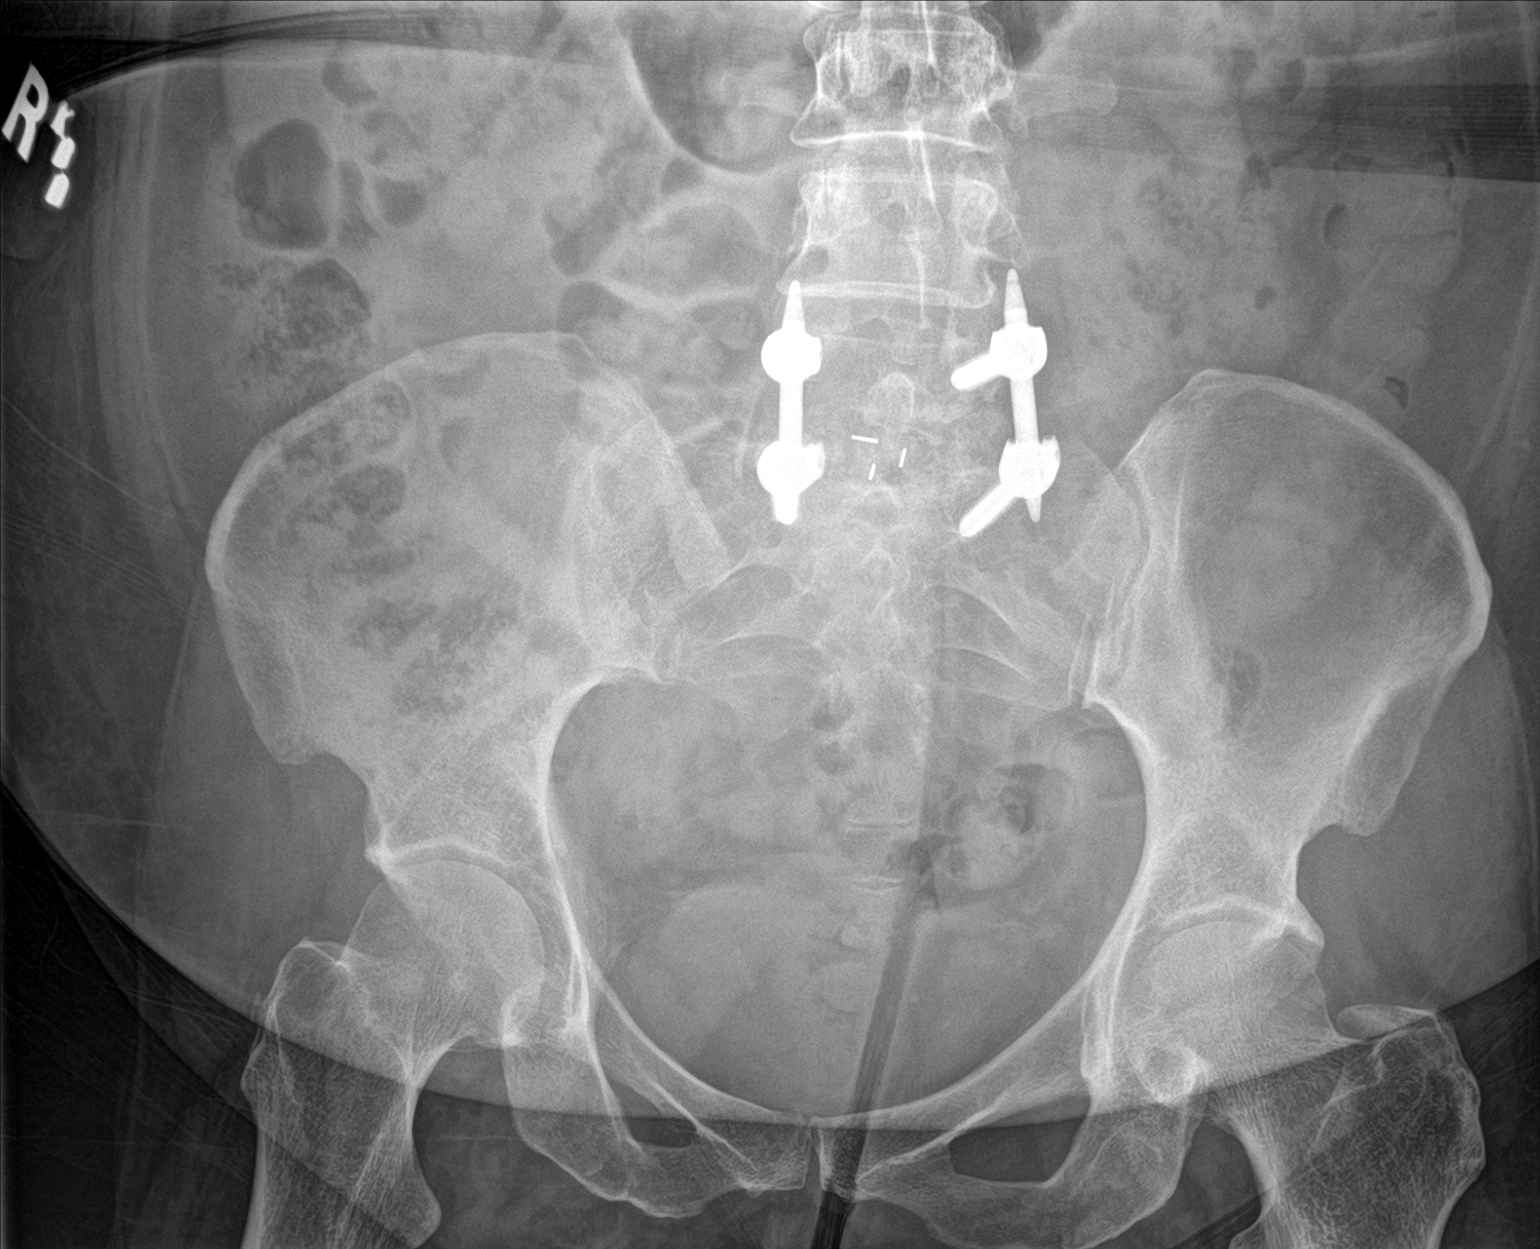

[hip ap]
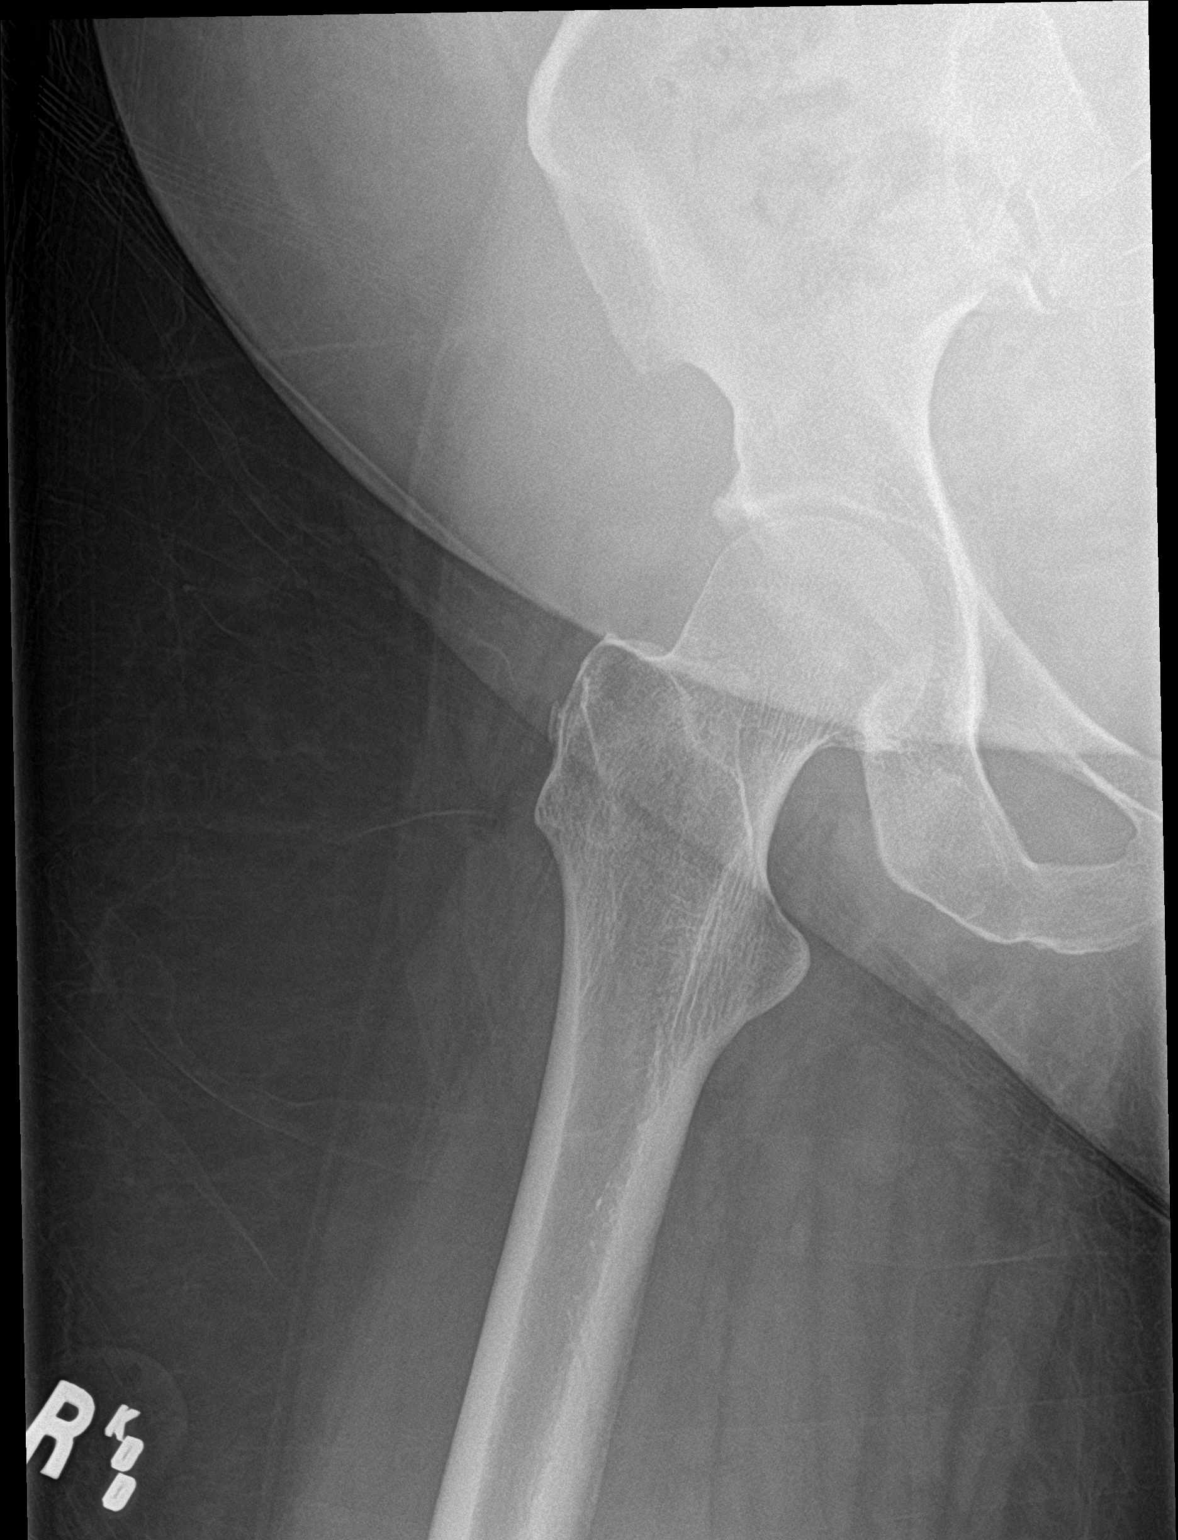

[hip lat]
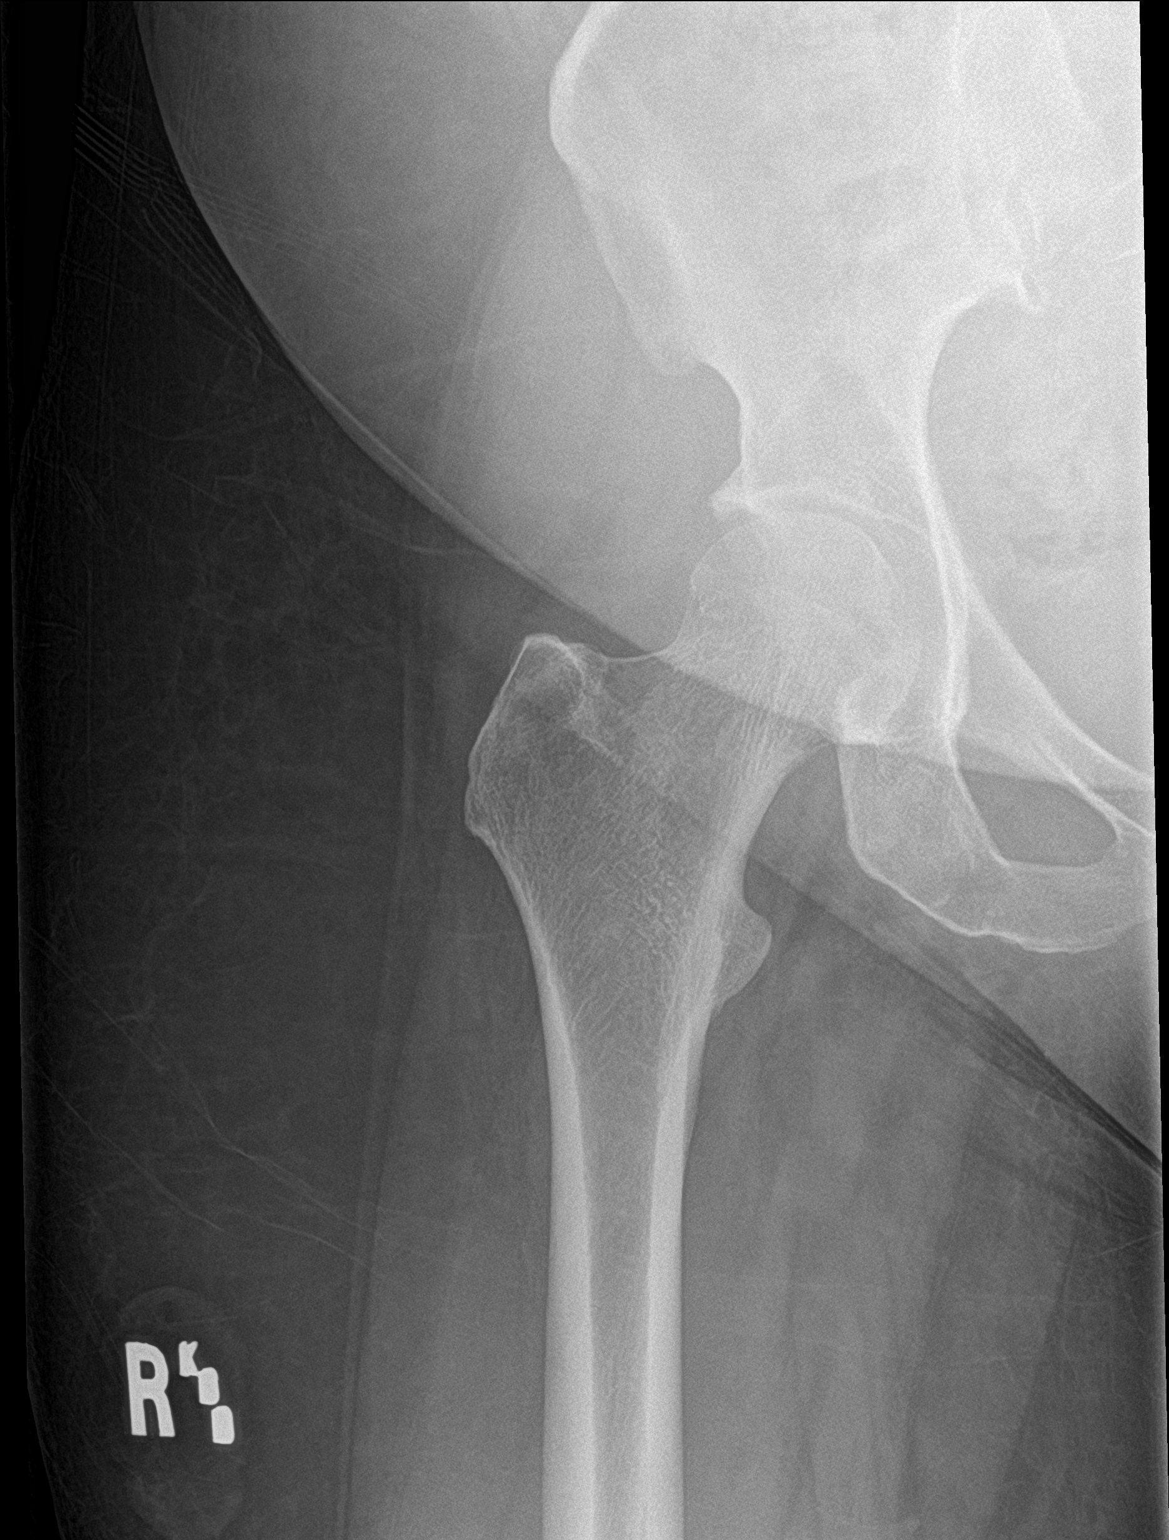

[3 of 3 positions shown; findings below may reference images not displayed]

FINDINGS: No acute fracture dislocation. Femoral heads in normal line with the
acetabula. Femoral head heights preserved. Bony pelvis intact. SI
joints approximated. No acute soft tissue abnormality.

Fusion hardware noted within the lower lumbar spine.
IMPRESSION: No acute osseous abnormality about the right hip.  The

## 2018-12-28 IMAGING — CT CT CERVICAL SPINE W/O CM
4 of 7 series · 14 of 33 positions shown, 15 images · non-contrast
Comparison: 08/25/2016 and 07/18/2014 CT of head and cervical
spine.

CLINICAL DATA: 56 y/o F; fall down stairs with neck pain, weakness,
dizziness, headache.

EXAM:
CT HEAD WITHOUT CONTRAST
CT CERVICAL SPINE WITHOUT CONTRAST
TECHNIQUE: Multidetector CT imaging of the head and cervical spine was
performed following the standard protocol without intravenous
contrast. Multiplanar CT image reconstructions of the cervical spine
were also generated.

[Series 8: c spine soft · axial · 0.23mm/px · z∈[+1294,+1402]mm · 4 of 90 slices shown]
[im 18/90  soft-tissue]
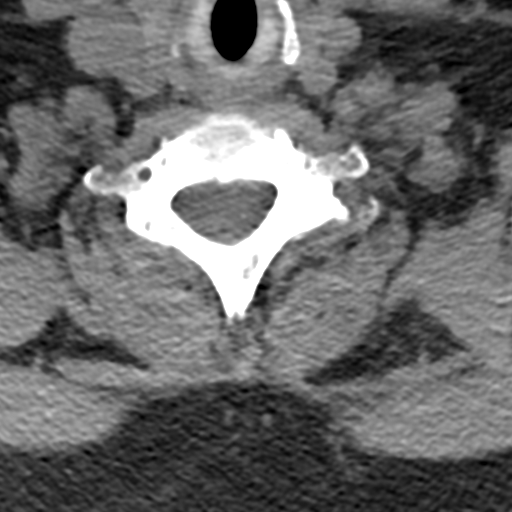
[im 36/90  soft-tissue]
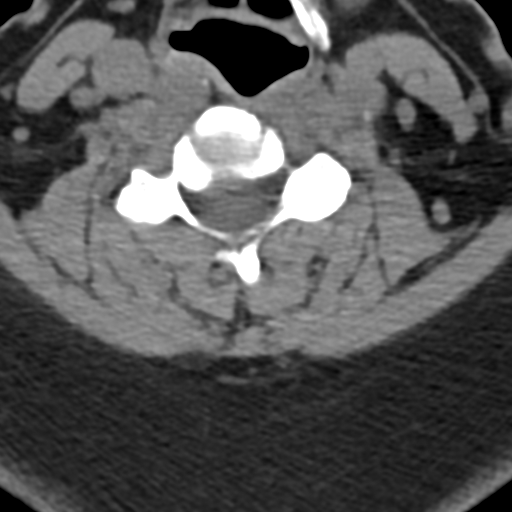
[im 54/90  soft-tissue]
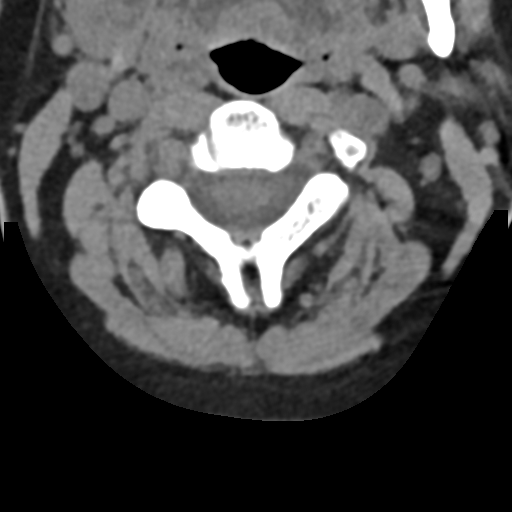
[im 72/90  soft-tissue]
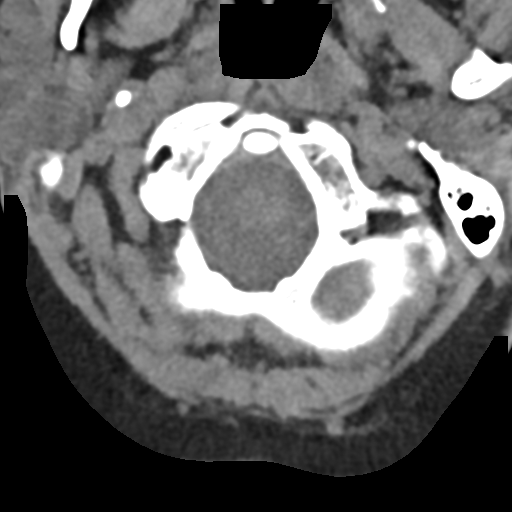

[Series 9: sagittal bone · sagittal · 0.23mm/px · 5 of 61 slices shown]
[im 11/61  bone]
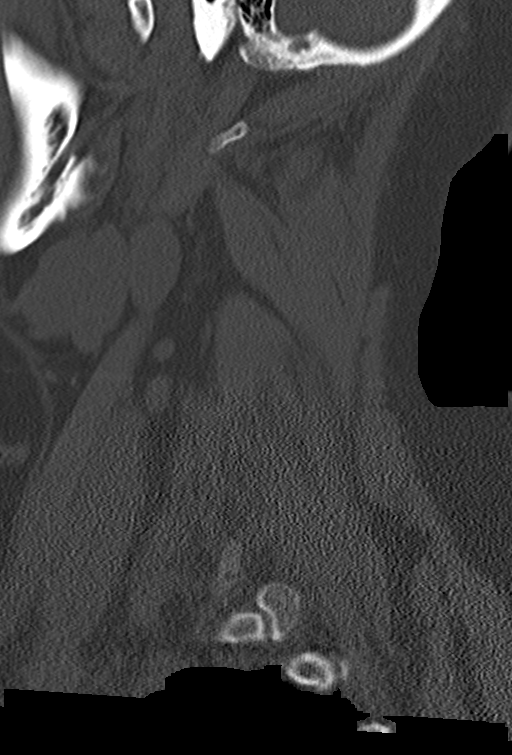
[im 21/61  bone]
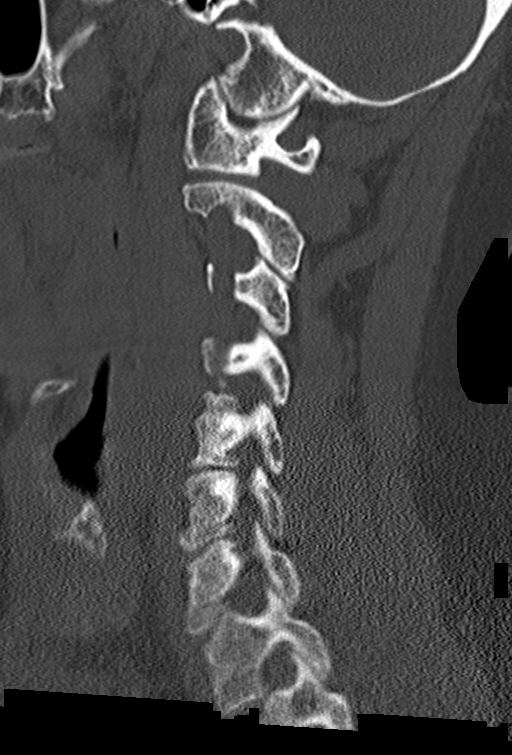
[im 31/61  bone]
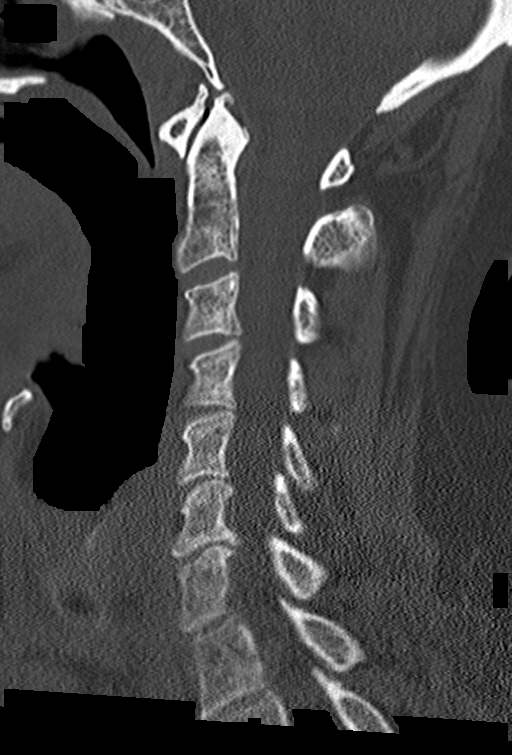
[im 41/61  bone]
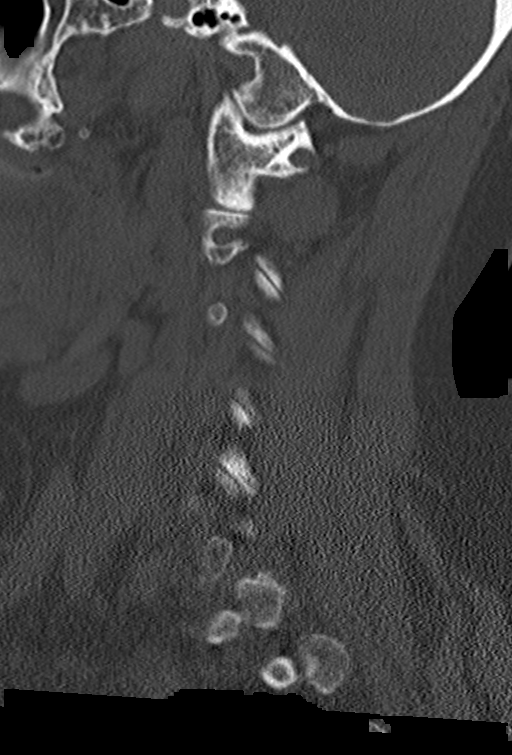
[im 51/61  bone]
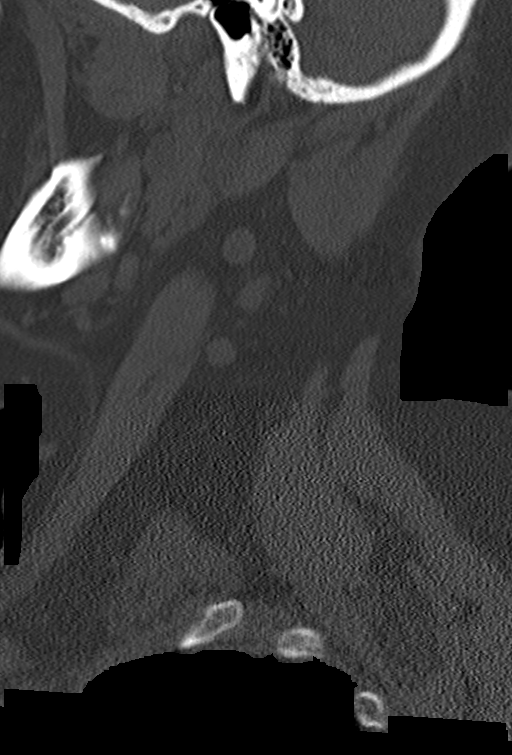

[Series 10: coronal bone · coronal · 0.26mm/px · 1 of 53 slices shown]
[im 27/53  bone]
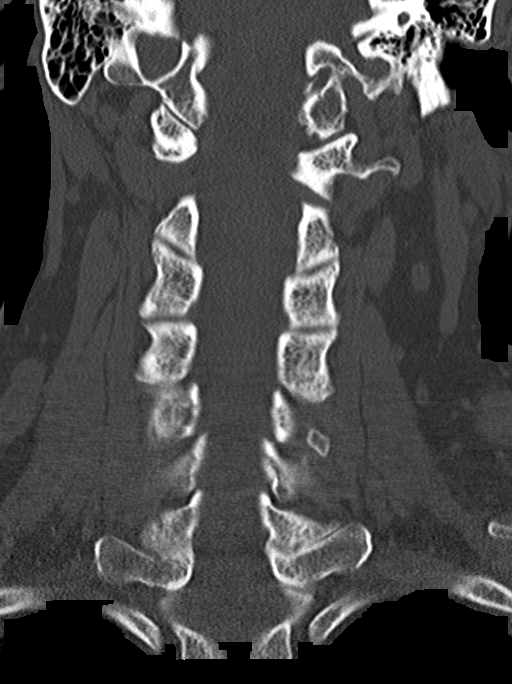

[Series 11: orthogonal bone · axial · 0.21mm/px · z∈[+1280,+1374]mm · 4 of 81 slices shown, 5 images]
[im 17/81  soft-tissue]
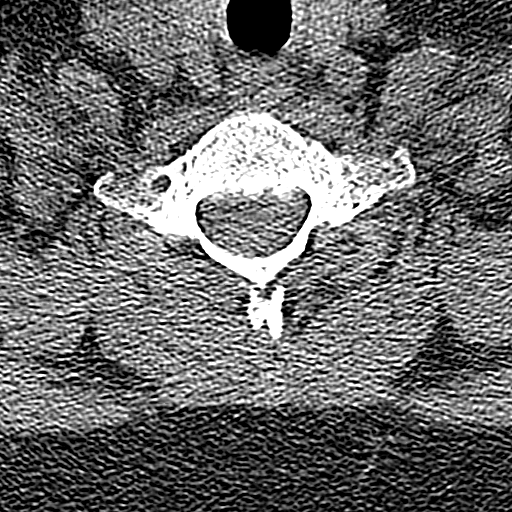
[im 17/81  bone]
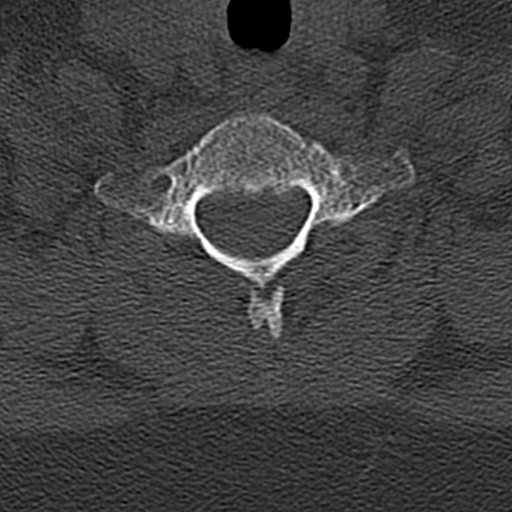
[im 33/81  bone]
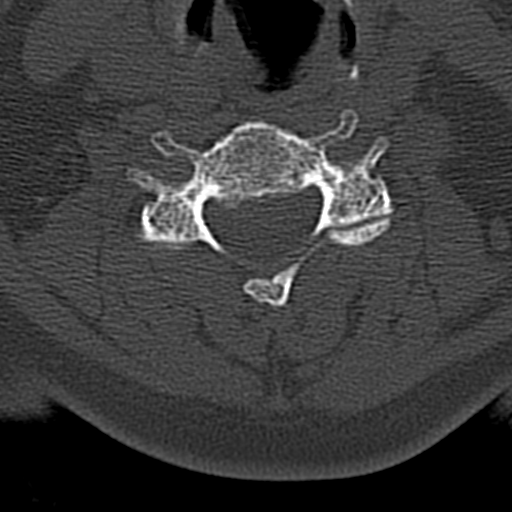
[im 49/81  bone]
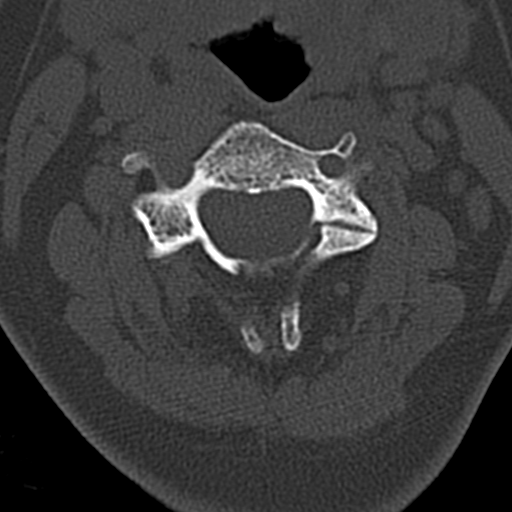
[im 65/81  bone]
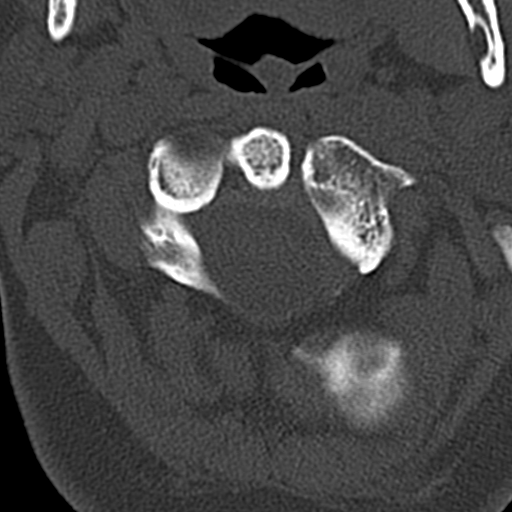

[14 of 33 positions shown; findings below may reference images not displayed]

FINDINGS: CT HEAD FINDINGS

Brain: No evidence of acute infarction, hemorrhage, hydrocephalus,
extra-axial collection or mass lesion/mass effect. Stable mild
chronic microvascular ischemic changes of the brain.

Vascular: Mild calcific atherosclerosis of carotid siphons.

Skull: Normal. Negative for fracture or focal lesion.

Sinuses/Orbits: No acute finding.

Other: None.

CT CERVICAL SPINE FINDINGS

Alignment: Straightening of cervical lordosis.

Skull base and vertebrae: No acute fracture. No primary bone lesion
or focal pathologic process.

Soft tissues and spinal canal: No prevertebral fluid or swelling. No
visible canal hematoma.

Disc levels: Uncovertebral and facet hypertrophy with mild
discogenic degenerative changes at the C5-C7 levels. Uncovertebral
hypertrophy results in mild left foraminal encroachment at C6-7 and
right foraminal encroachment at C5-C7. No significant bony canal
stenosis.

Upper chest: Negative.

Other: Calcific atherosclerosis of the carotid bifurcations
bilaterally. Brief retropharyngeal course of right proximal ICA.
IMPRESSION: 1. No acute intracranial abnormality or calvarial fracture.
2. No acute fracture or dislocation of cervical spine.
3. Stable mild chronic microvascular ischemic changes of the brain.
4. Stable mild cervical spine spondylosis greatest at C5-C7 levels.

By: Onetri Agustus M.D.

## 2018-12-28 IMAGING — DX DG LUMBAR SPINE COMPLETE 4+V
5 series · 5 of 5 positions shown · non-contrast
Comparison: Prior radiograph from 05/06/2016.

CLINICAL DATA: Initial evaluation for acute trauma, fall.

EXAM:
LUMBAR SPINE - COMPLETE 4+ VIEW

[l-spine ap]
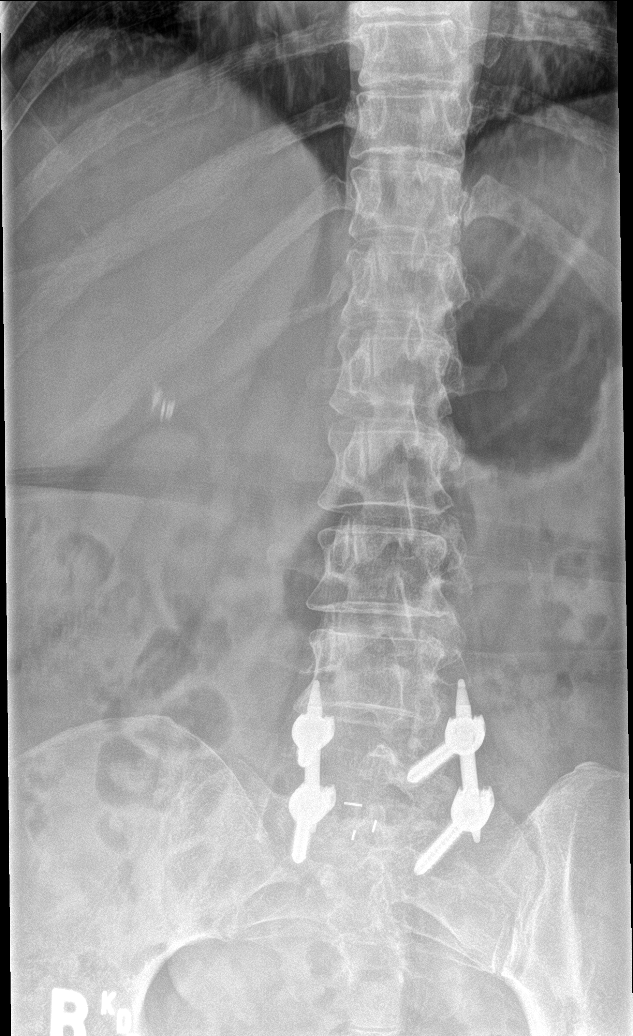

[l-spine obl (1 of 2)]
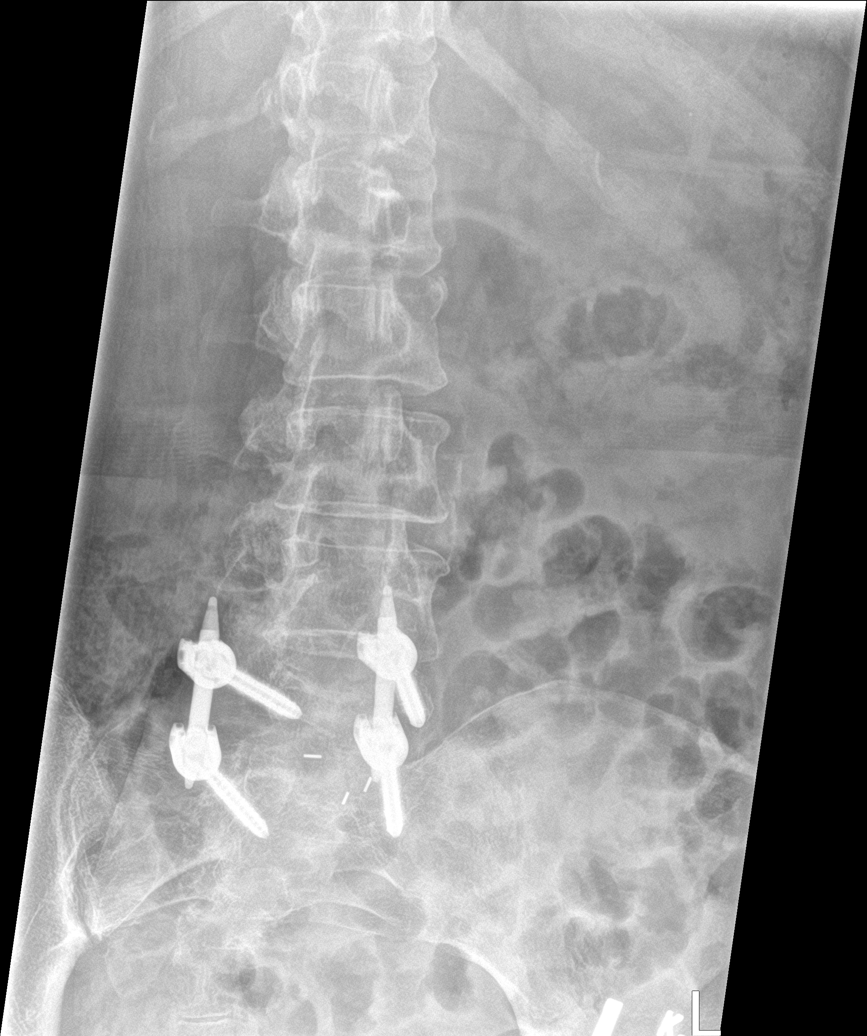

[l-spine obl (2 of 2)]
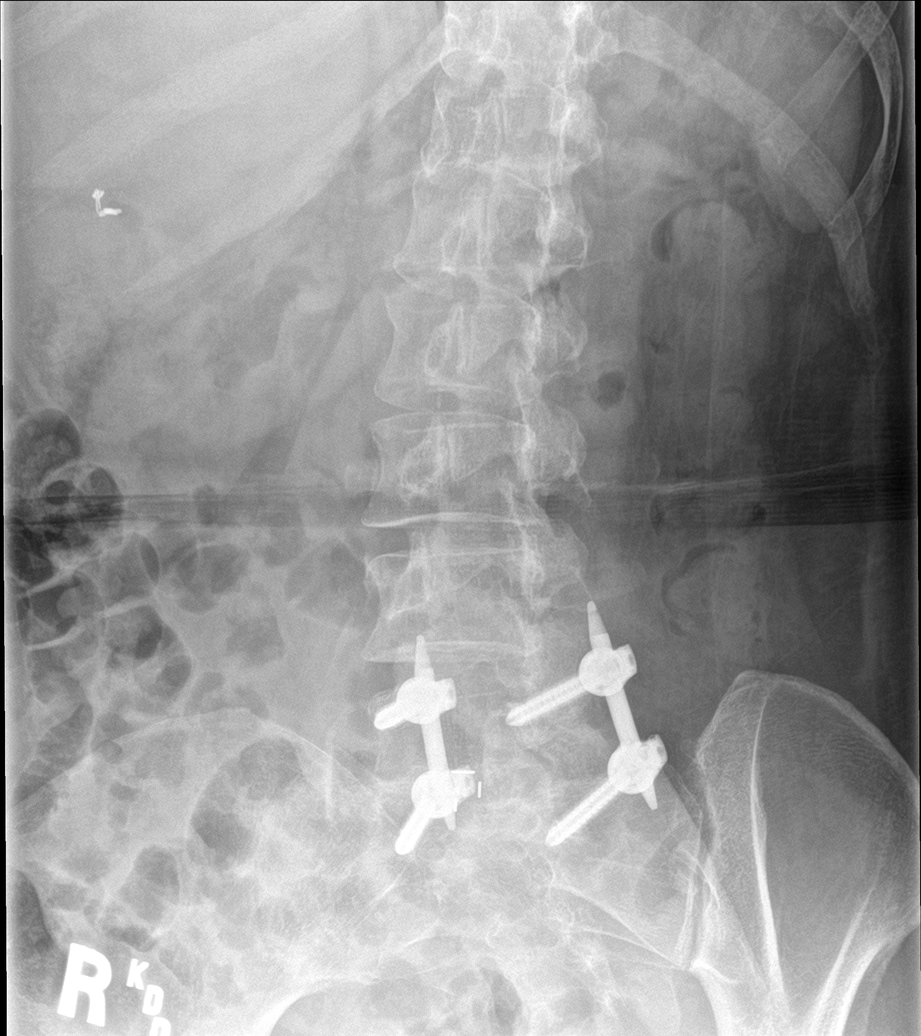

[l-spine lat]
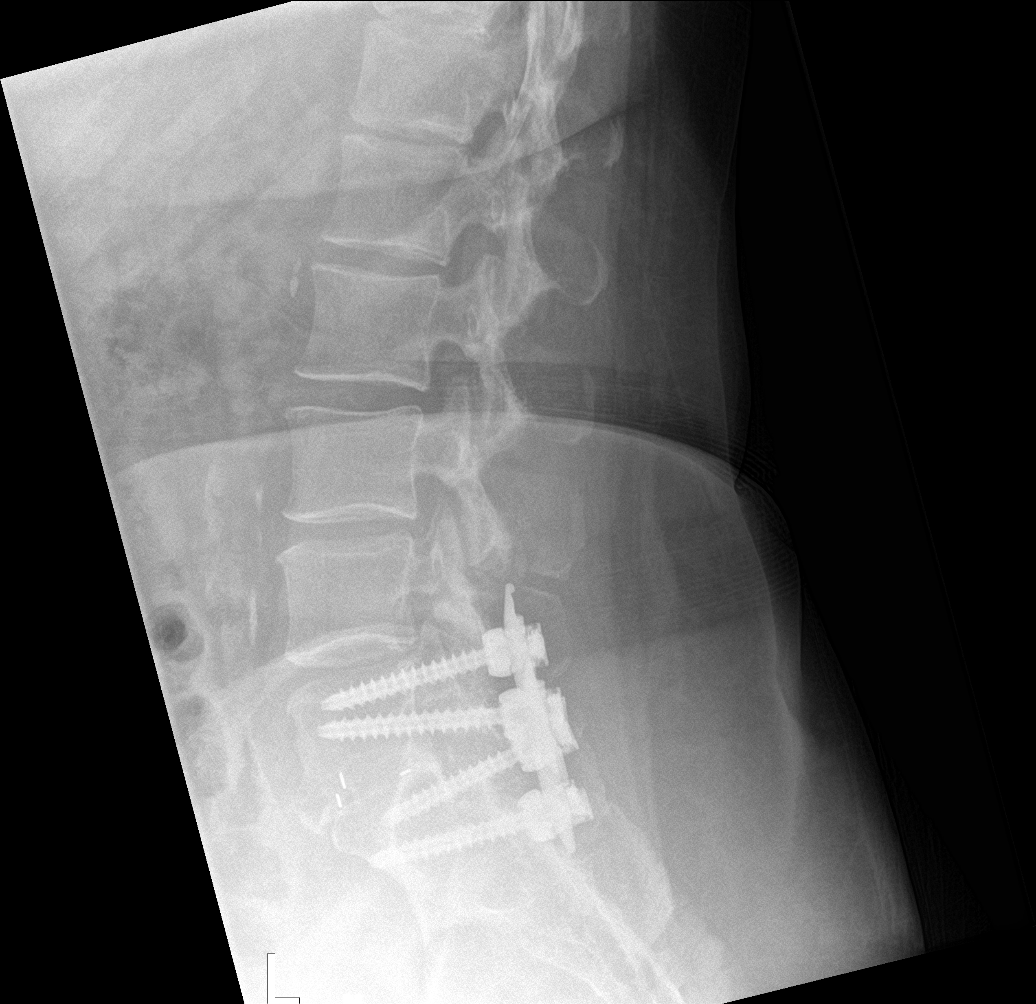

[l-spine spot]
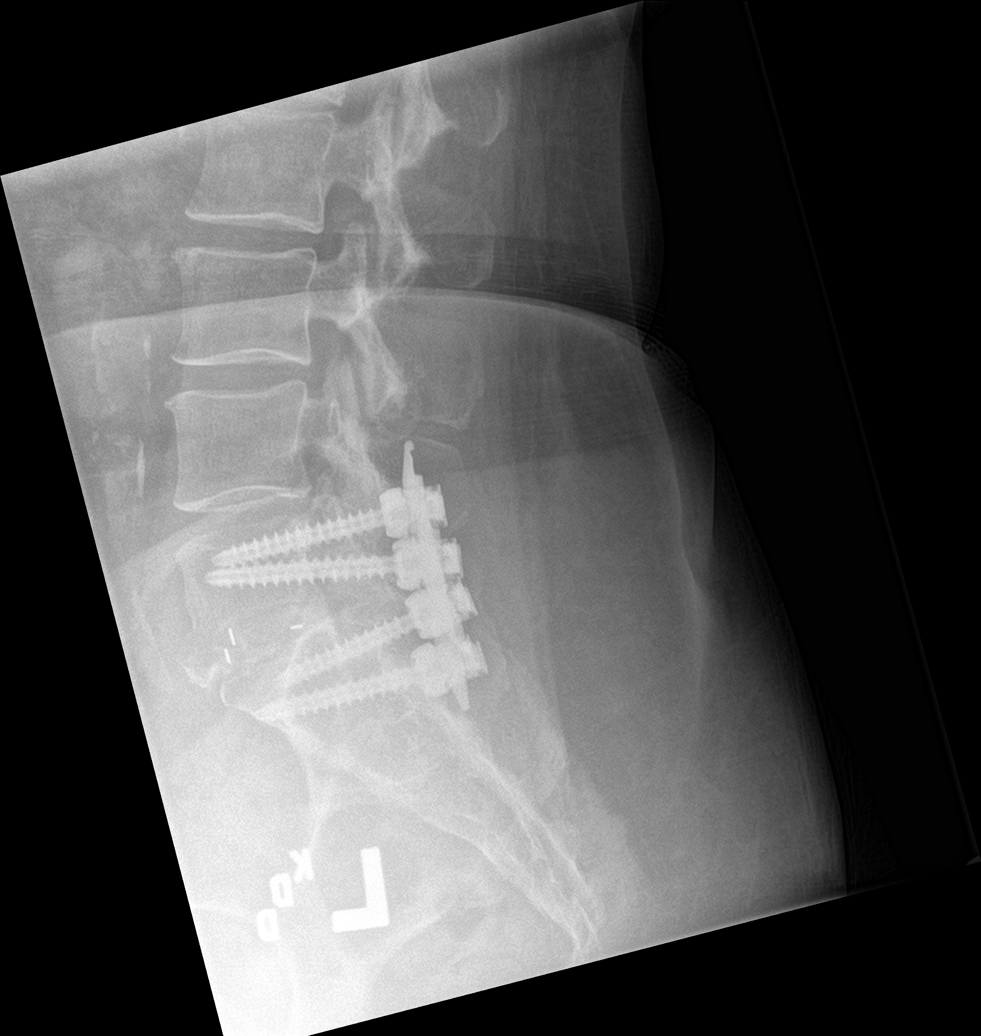

[5 of 5 positions shown; findings below may reference images not displayed]

FINDINGS: Five non rib-bearing lumbar type vertebral bodies. Vertebral bodies
normally aligned with preservation of the normal lumbar lordosis. No
listhesis. Patient is status post PLIF at L5-S1. Hardware intact
without complication. No acute fracture. Visualized sacrum intact.

Aortic atherosclerosis.  Cholecystectomy clips noted.
IMPRESSION: 1. No radiographic evidence for acute abnormality within the lumbar
spine.
2. Sequelae of prior PLIF at L5-S1 without hardware complication.
3. Aortic atherosclerosis.

## 2018-12-28 IMAGING — DX DG KNEE COMPLETE 4+V*R*
4 series · 4 of 4 positions shown · non-contrast
Comparison: None.

CLINICAL DATA: Initial evaluation for acute trauma, fall.

EXAM:
RIGHT KNEE - COMPLETE 4+ VIEW

[knee ap]
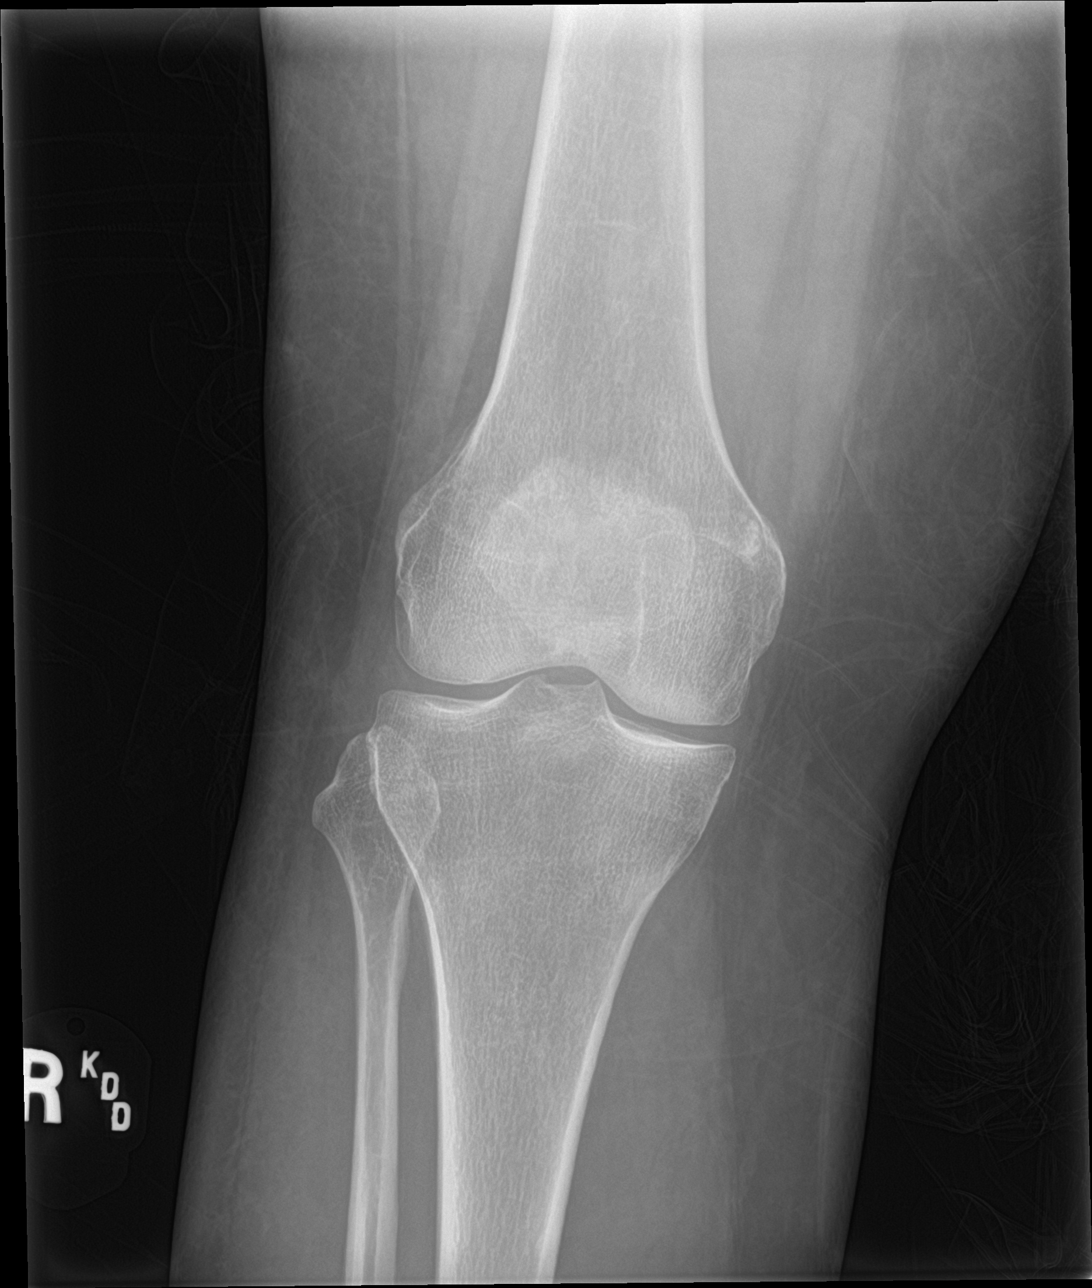

[tunnel]
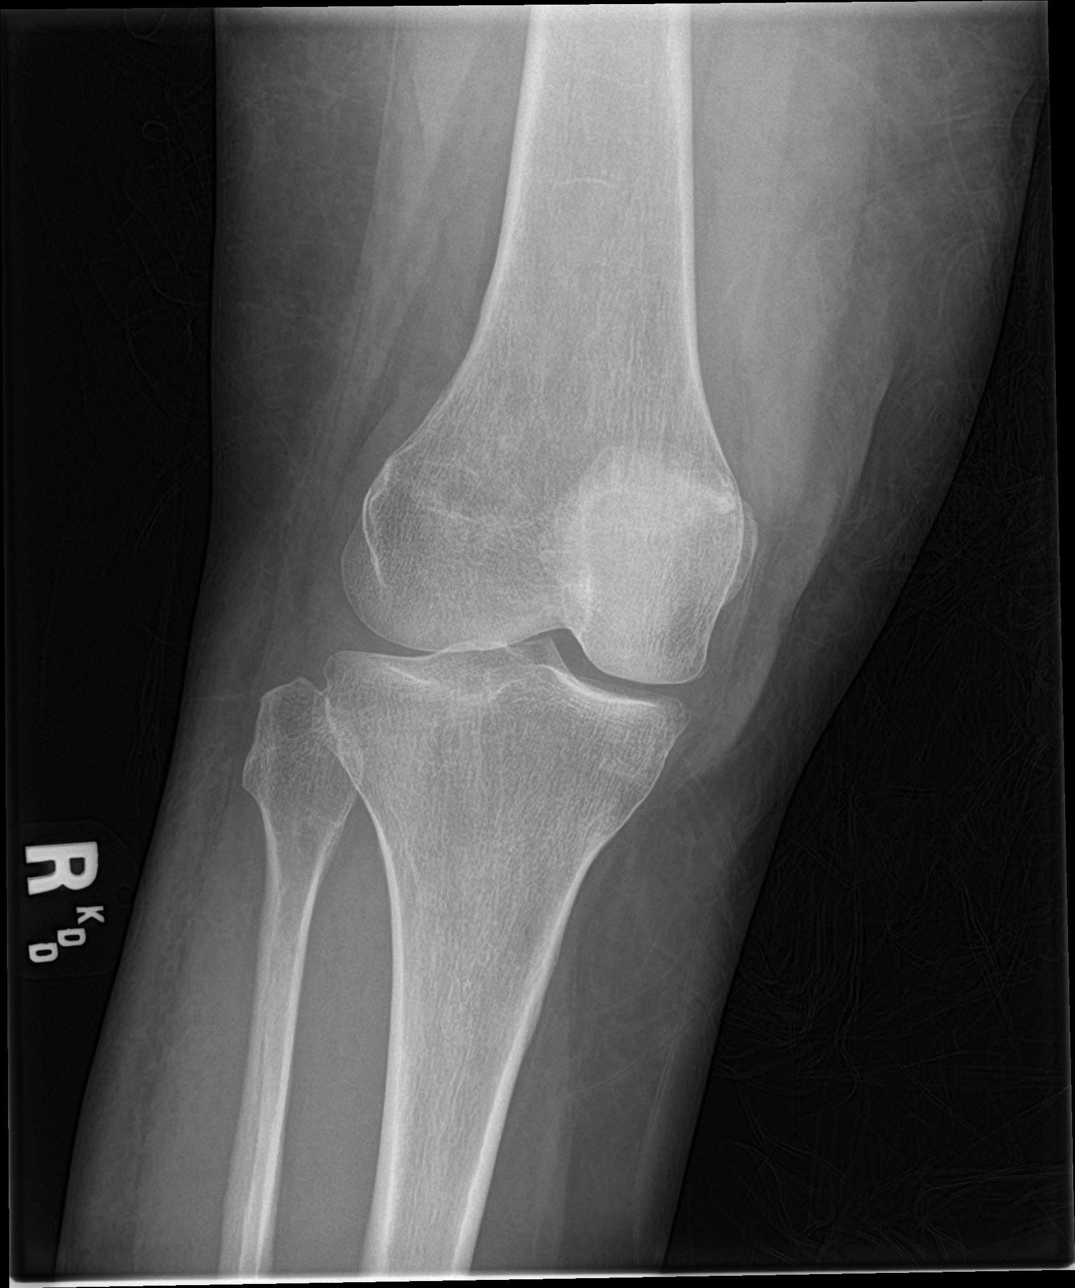

[knee lat]
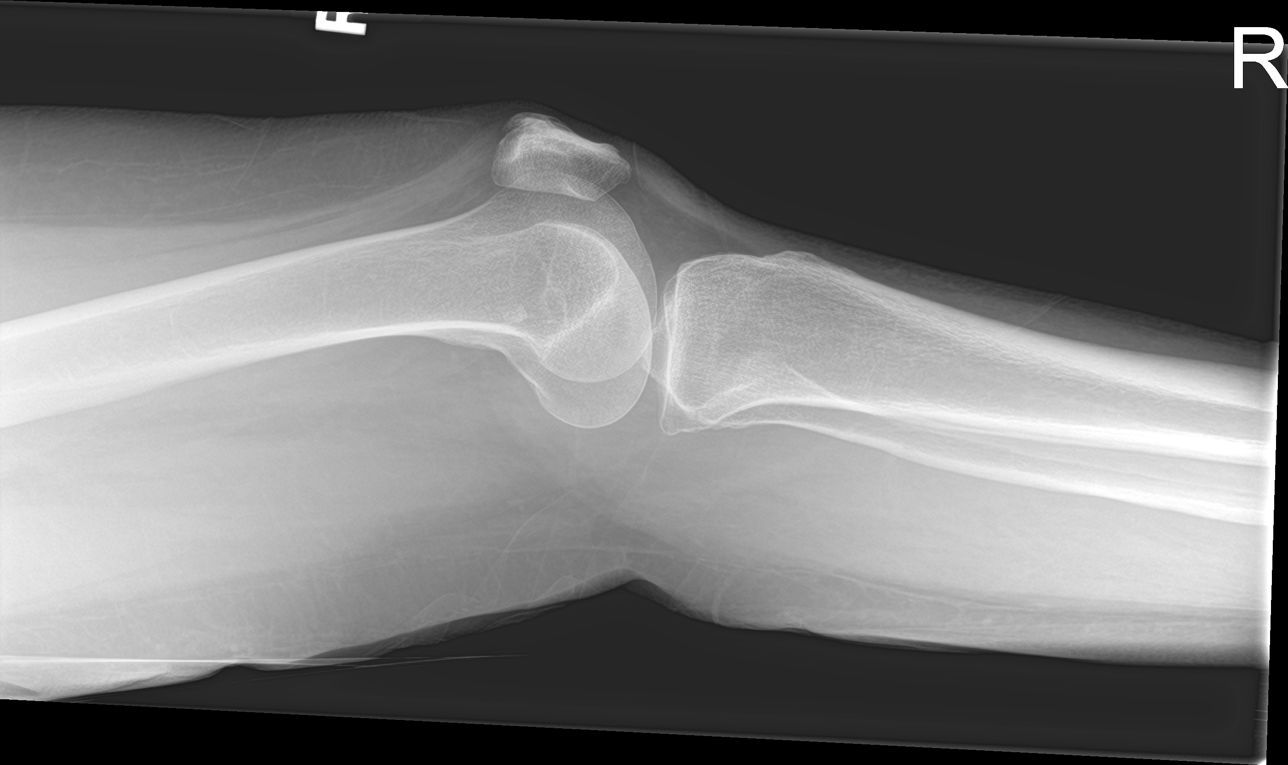

[knee sunrise]
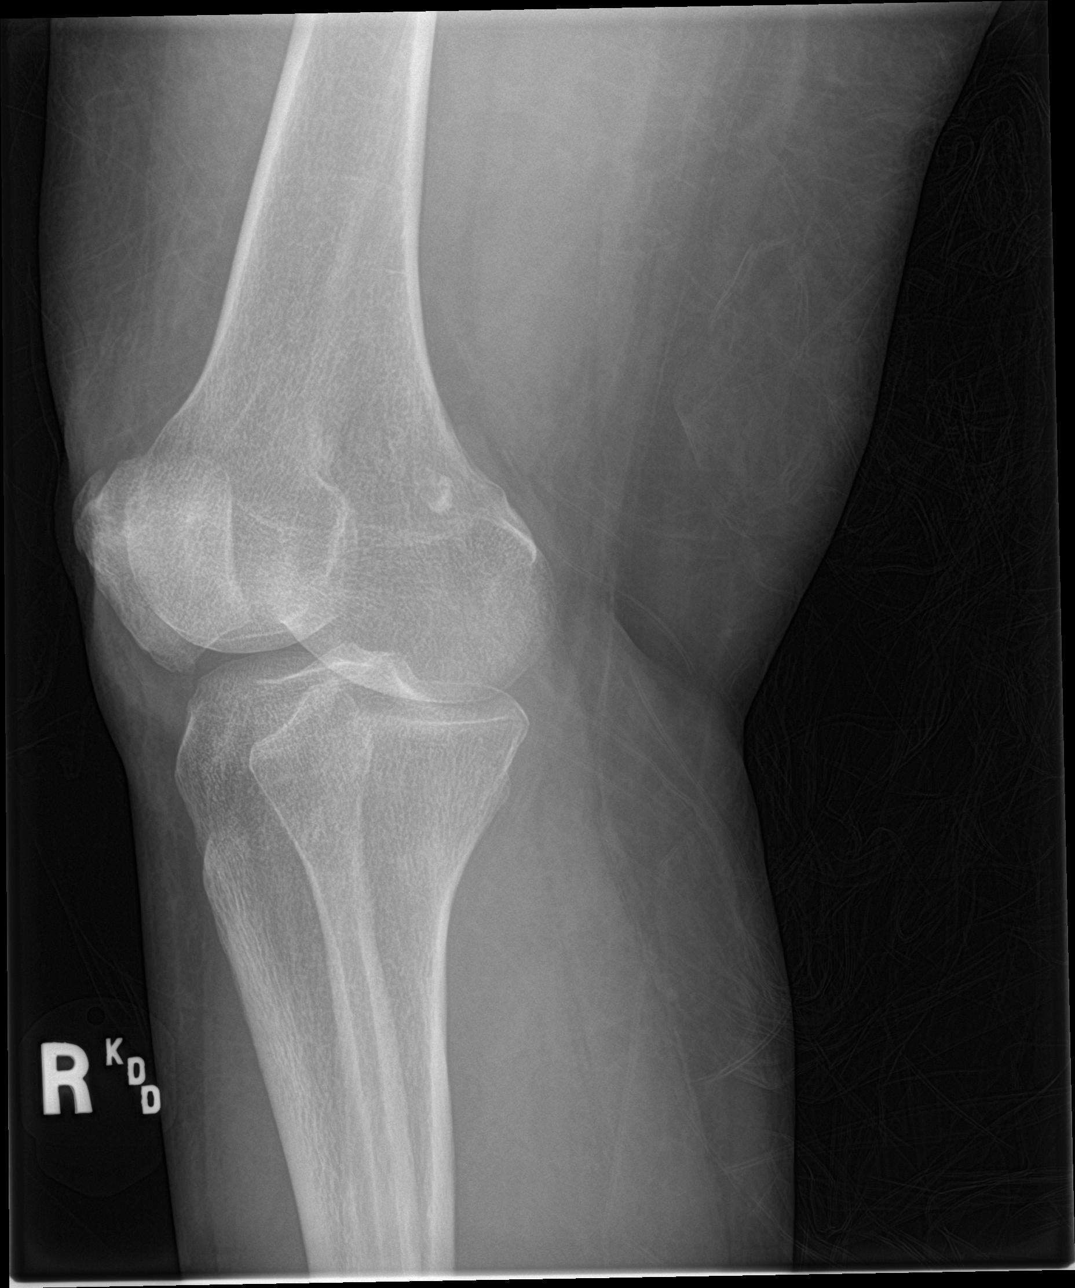

[4 of 4 positions shown; findings below may reference images not displayed]

FINDINGS: No evidence of fracture, dislocation, or joint effusion. No evidence
of arthropathy or other focal bone abnormality. Soft tissues are
unremarkable.
IMPRESSION: No acute osseous abnormality about the right knee.

## 2019-01-10 ENCOUNTER — Other Ambulatory Visit: Payer: Self-pay

## 2019-01-10 ENCOUNTER — Ambulatory Visit (HOSPITAL_COMMUNITY)
Admission: RE | Admit: 2019-01-10 | Discharge: 2019-01-10 | Disposition: A | Discharge status: Home / Self Care | Payer: Medicare Other | Source: Ambulatory Visit | Attending: Family Medicine | Admitting: Family Medicine

## 2019-01-10 ENCOUNTER — Encounter (HOSPITAL_COMMUNITY): Payer: Self-pay

## 2019-01-10 DIAGNOSIS — Z1231 Encounter for screening mammogram for malignant neoplasm of breast: Secondary | ICD-10-CM | POA: Insufficient documentation

## 2019-01-18 IMAGING — DX DG CHEST 1V
1 series · 1 of 1 positions shown · non-contrast
Comparison: 04/17/2017

CLINICAL DATA: Chest pain

EXAM:
CHEST 1 VIEW

[chest pa]
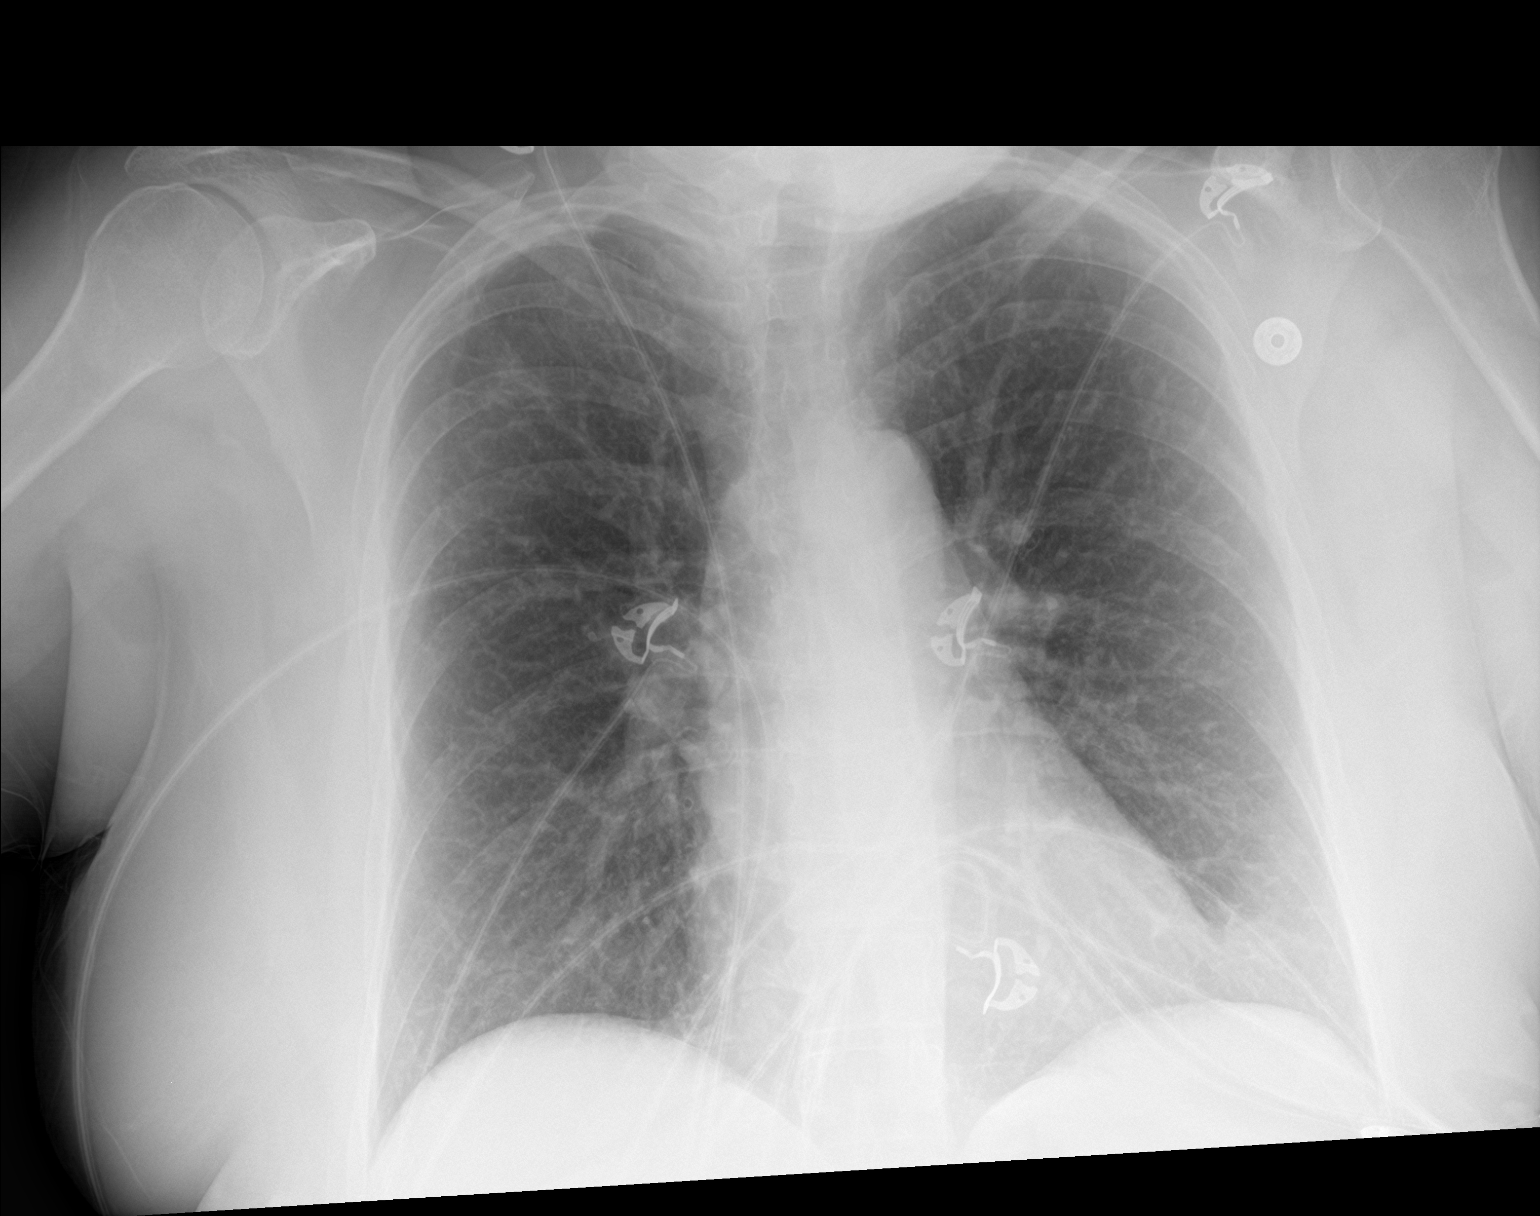

[1 of 1 positions shown; findings below may reference images not displayed]

FINDINGS: The heart size and mediastinal contours are within normal limits.
Both lungs are clear. The visualized skeletal structures are
unremarkable.
IMPRESSION: No active disease.

## 2019-01-18 IMAGING — CT CT HEAD CODE STROKE
3 series · 15 of 47 positions shown, 18 images · non-contrast
Comparison: Head CT 04/25/2017

CLINICAL DATA: Code stroke.  Sudden onset left-sided weakness

EXAM:
CT HEAD WITHOUT CONTRAST
TECHNIQUE: Contiguous axial images were obtained from the base of the skull
through the vertex without intravenous contrast.

[Series 2: head code stroke wo · axial · 0.47mm/px · z∈[+52,+182]mm · 9 of 32 slices shown, 12 images]
[im 3/32  brain]
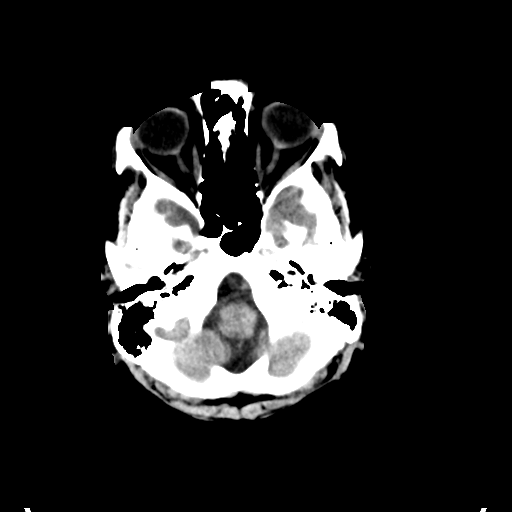
[im 3/32  bone]
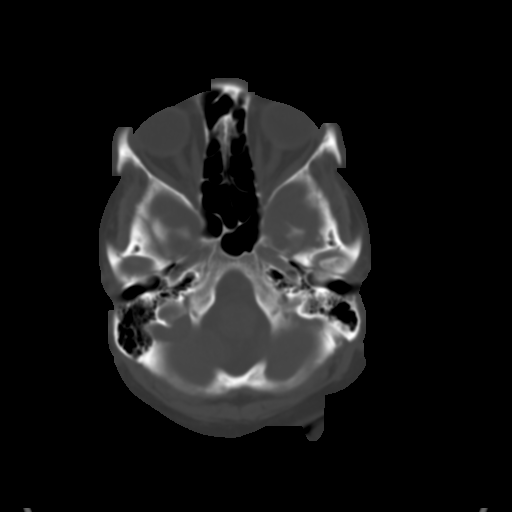
[im 6/32  brain]
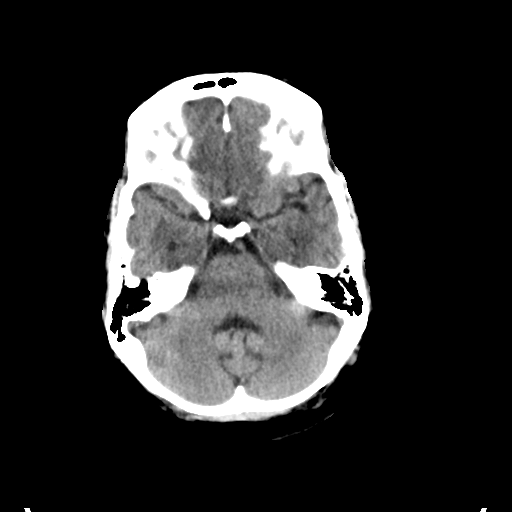
[im 9/32  brain]
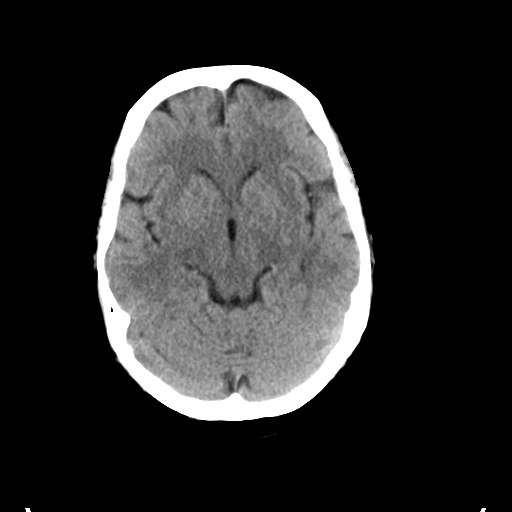
[im 12/32  brain]
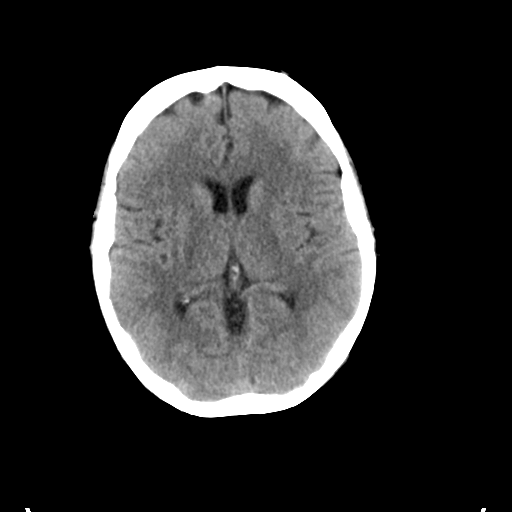
[im 17/32  brain]
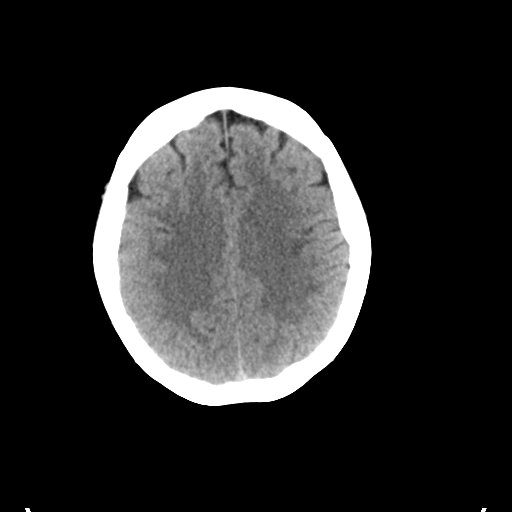
[im 17/32  bone]
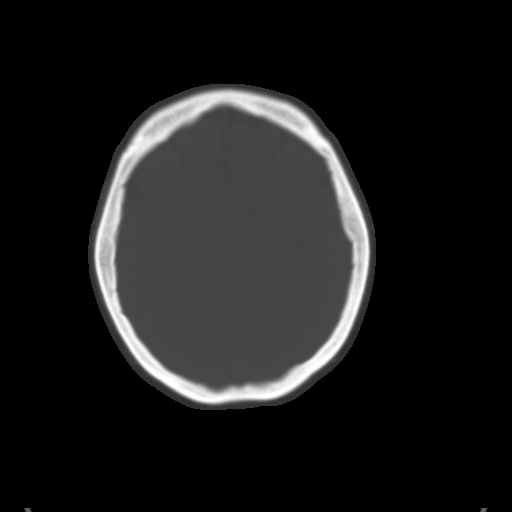
[im 20/32  brain]
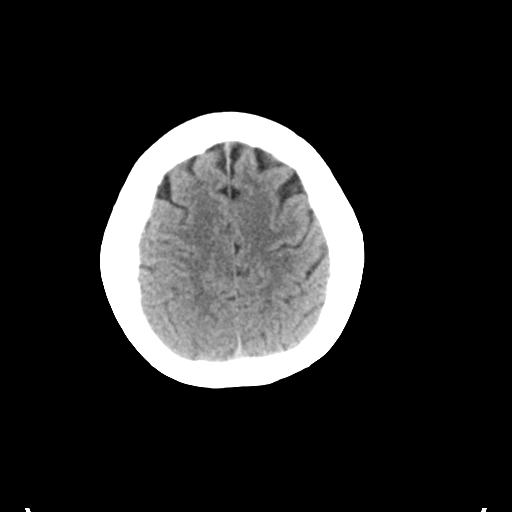
[im 23/32  brain]
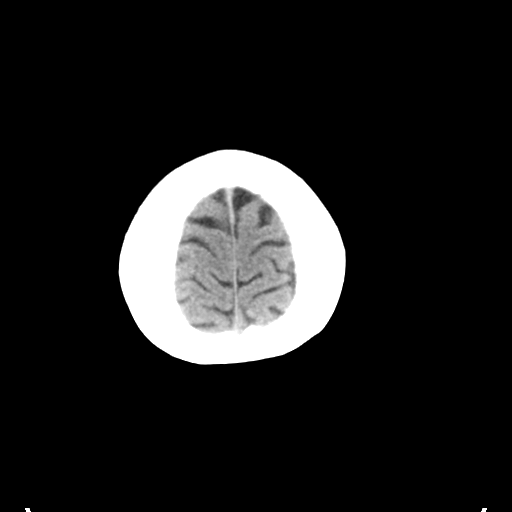
[im 26/32  brain]
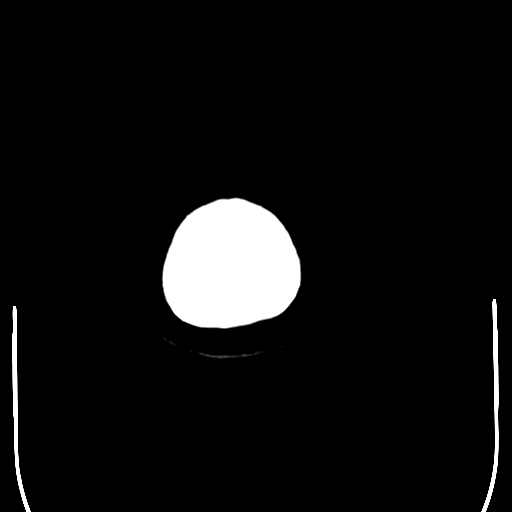
[im 29/32  brain]
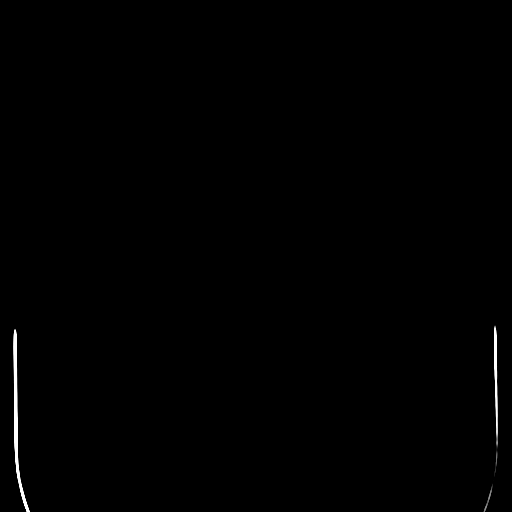
[im 29/32  bone]
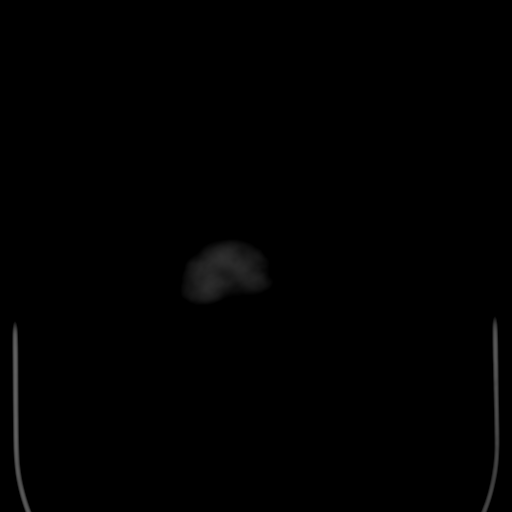

[Series 4: coronal soft tissue · coronal · 0.28mm/px · 3 of 63 slices shown]
[im 21/63  brain]
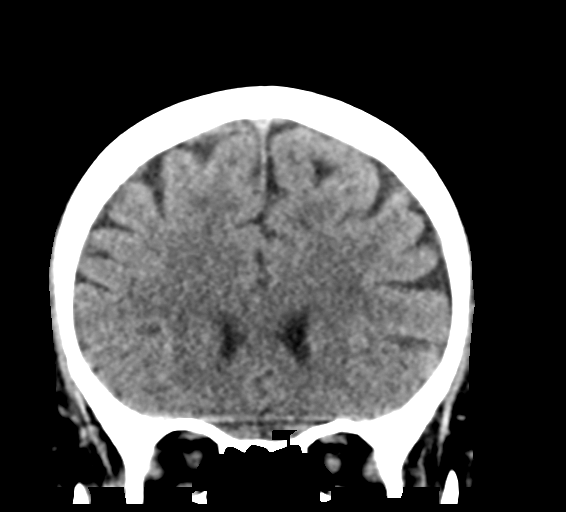
[im 28/63  brain]
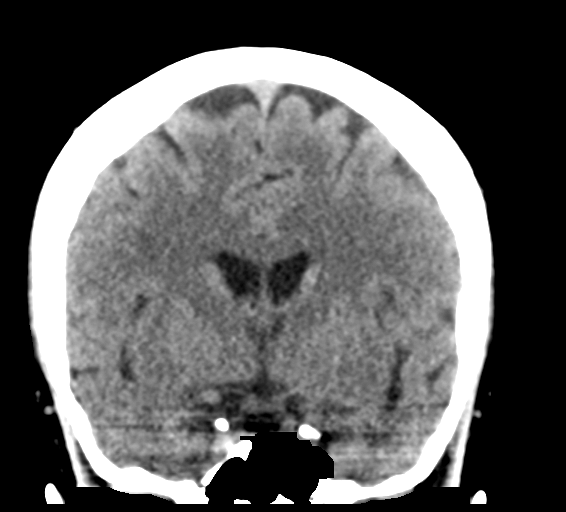
[im 35/63  brain]
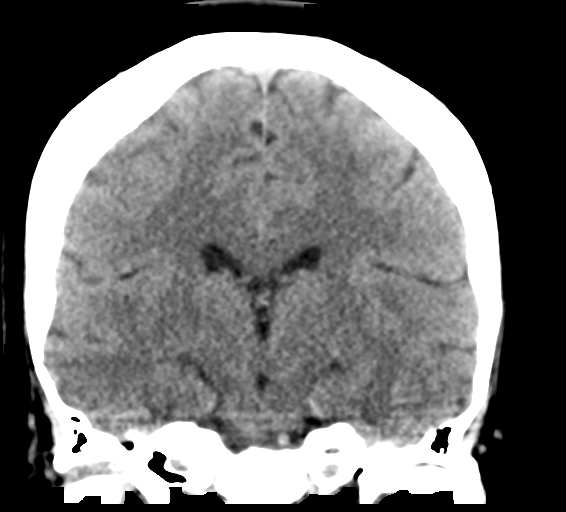

[Series 5: sagittal soft tissue · sagittal · 0.30mm/px · 3 of 51 slices shown]
[im 17/51  brain]
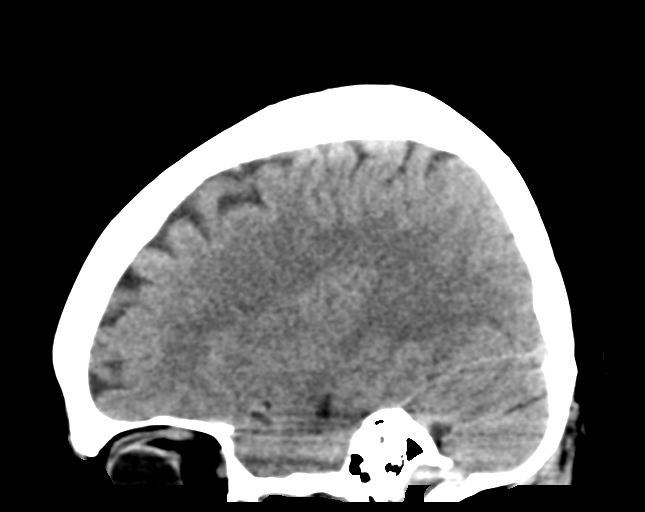
[im 26/51  brain]
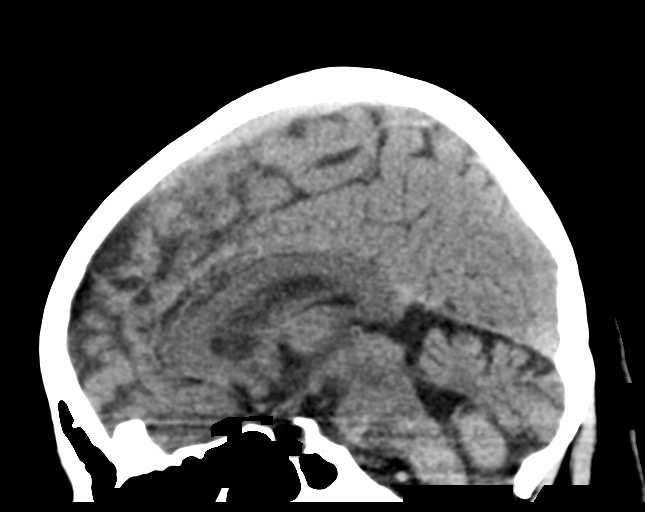
[im 34/51  brain]
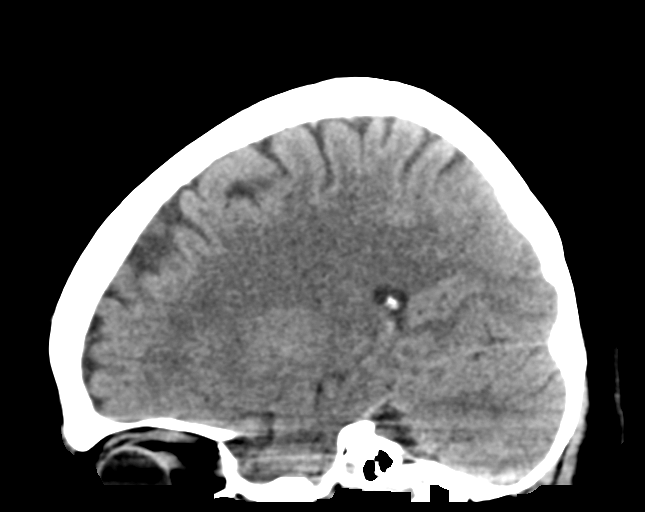

[15 of 47 positions shown; findings below may reference images not displayed]

FINDINGS: Brain: No mass lesion or acute hemorrhage. No focal hypoattenuation
of the basal ganglia or cortex to indicate infarcted tissue. No
hydrocephalus or age advanced atrophy.

Vascular: No hyperdense vessel. No advanced atherosclerotic
calcification of the arteries at the skull base.

Skull: Normal visualized skull base, calvarium and extracranial soft
tissues.

Sinuses/Orbits: No sinus fluid levels or advanced mucosal
thickening. No mastoid effusion. Normal orbits.

ASPECTS ([place]ber[place] Stroke Program Early CT Score)

- Ganglionic level infarction (caudate, lentiform nuclei, internal
capsule, insula, M1-M3 cortex): 7

- Supraganglionic infarction (M4-M6 cortex): 3

Total score (0-10 with 10 being normal): 10
IMPRESSION: 1. Normal head CT.
2. ASPECTS is 10.

These results were called by telephone at the time of interpretation
on 05/16/2017 at [DATE] to Dr. MACCENE SAGITARIO , who verbally
acknowledged these results.

## 2019-01-20 IMAGING — MR MR HEAD W/O CM
9 series · 47 of 48 positions shown · non-contrast
Comparison: Brain MRI 06/29/2016 and earlier. CTA head and neck
05/16/2017.

CLINICAL DATA: 56-year-old female with altered mental status for 2
days.

EXAM:
MRI HEAD WITHOUT CONTRAST
TECHNIQUE: Multiplanar, multiecho pulse sequences of the brain and surrounding
structures were obtained without intravenous contrast.

[Series 5: DWI · axial · 3.0mm · 0.82mm/px · z∈[-87,+44]mm · 6 of 45 slices shown (1 of 6)]
[im 1/45]
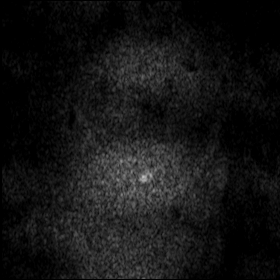
[im 9/45]
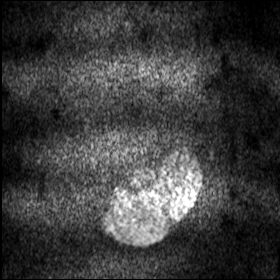
[im 18/45]
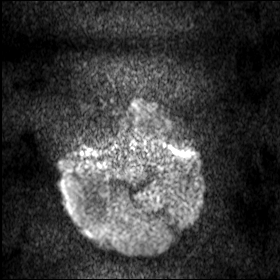
[im 27/45]
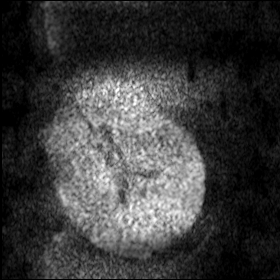
[im 36/45]
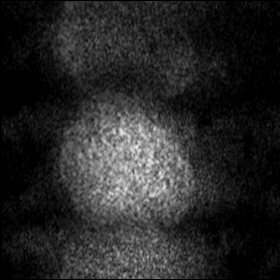
[im 45/45]
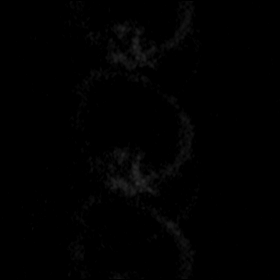

[Series 6: DWI · axial · 3.0mm · 0.82mm/px · z∈[-117,+44]mm · 8 of 55 slices shown (2 of 6)]
[im 1/55]
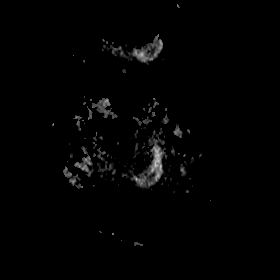
[im 8/55]
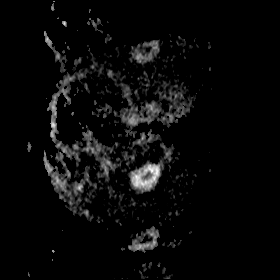
[im 16/55]
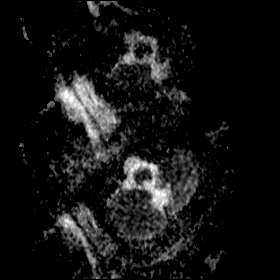
[im 24/55]
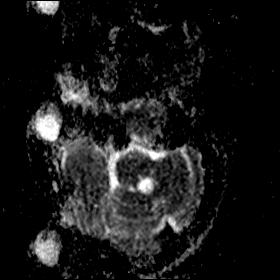
[im 31/55]
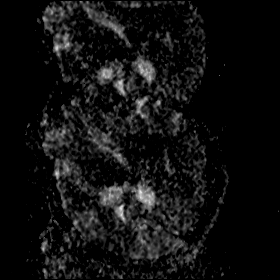
[im 39/55]
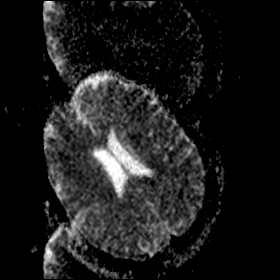
[im 47/55]
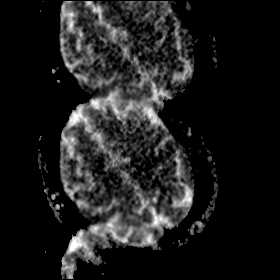
[im 55/55]
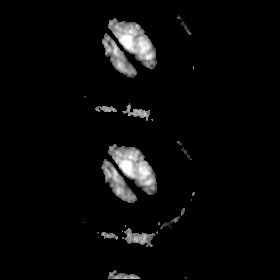

[Series 8: t1_fl2d_sag · sagittal · 5.0mm · 0.45mm/px · 2 of 20 slices shown]
[im 1/20]
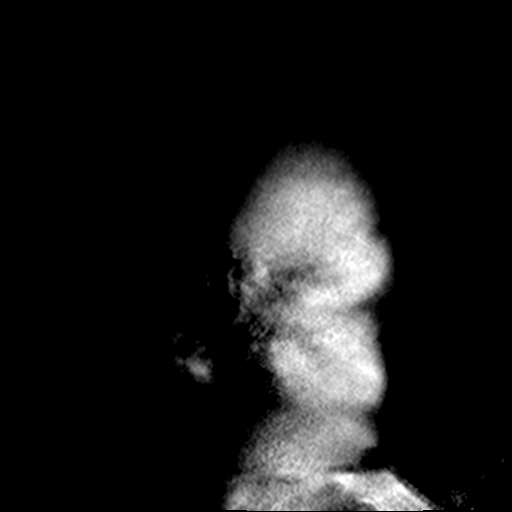
[im 10/20]
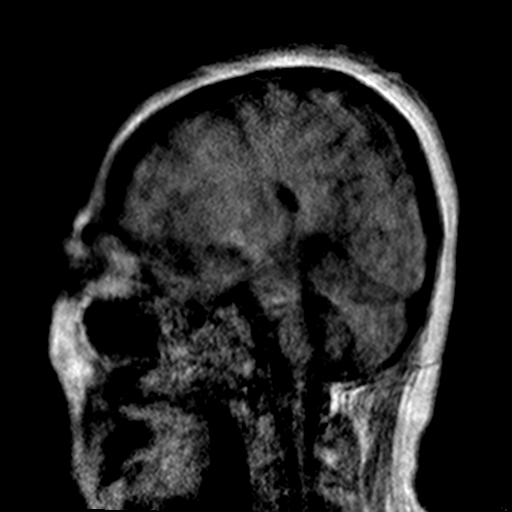

[Series 9: DWI · axial · 3.0mm · 0.82mm/px · z∈[-113,+49]mm · 8 of 55 slices shown (3 of 6)]
[im 1/55]
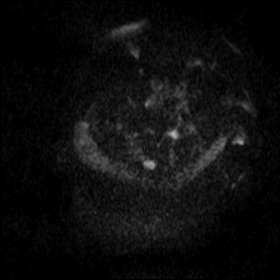
[im 8/55]
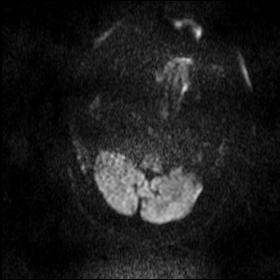
[im 16/55]
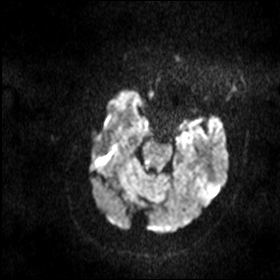
[im 24/55]
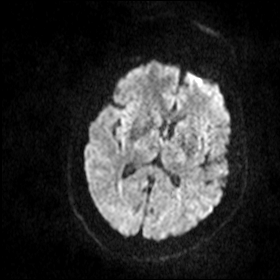
[im 31/55]
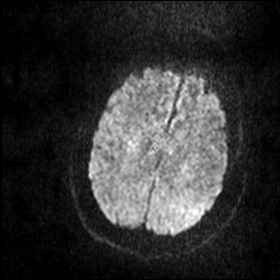
[im 39/55]
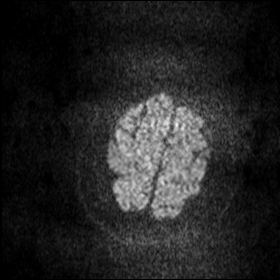
[im 47/55]
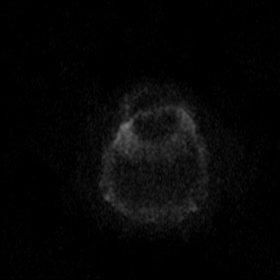
[im 55/55]
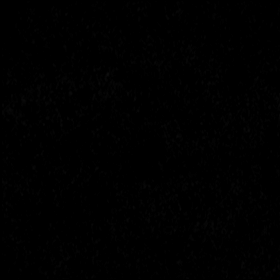

[Series 10: DWI · axial · 3.0mm · 0.82mm/px · z∈[-113,+37]mm · 7 of 51 slices shown (4 of 6)]
[im 1/51]
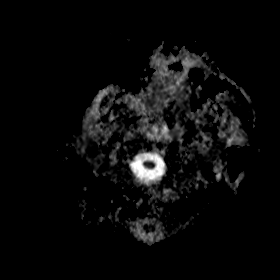
[im 9/51]
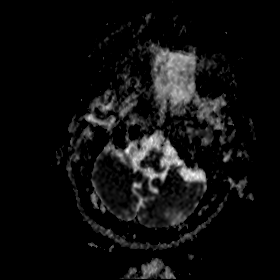
[im 17/51]
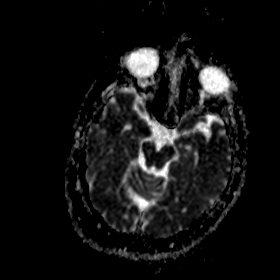
[im 26/51]
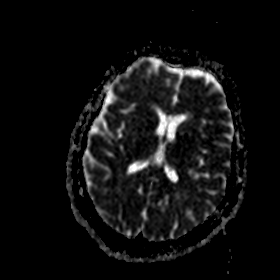
[im 34/51]
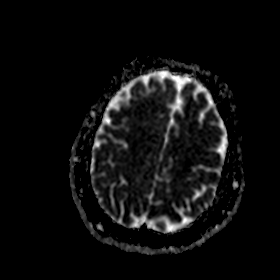
[im 42/51]
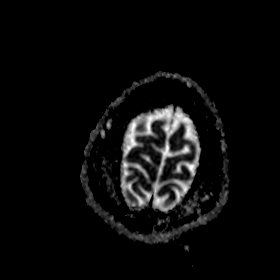
[im 51/51]
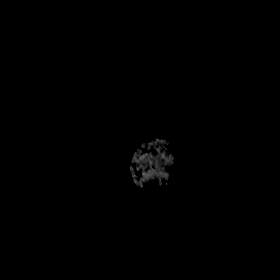

[Series 12: DWI · coronal · 5.0mm · 0.45mm/px · 5 of 34 slices shown (5 of 6)]
[im 1/34]
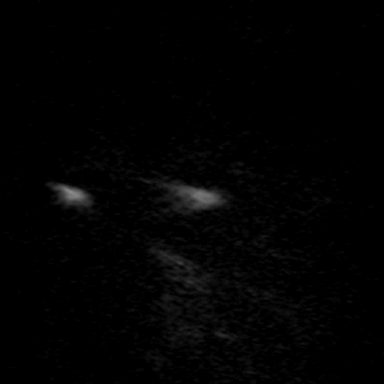
[im 9/34]
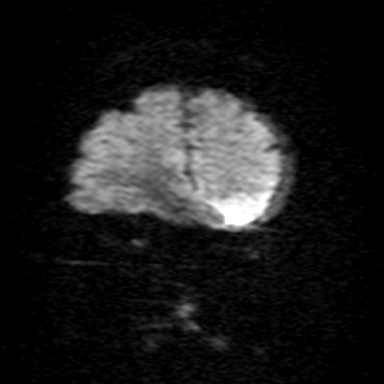
[im 17/34]
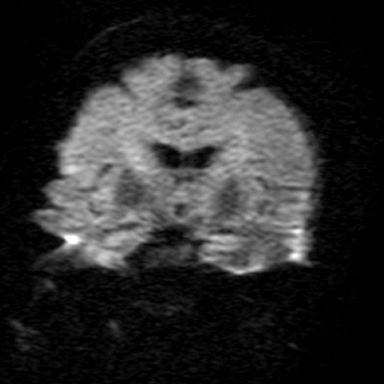
[im 25/34]
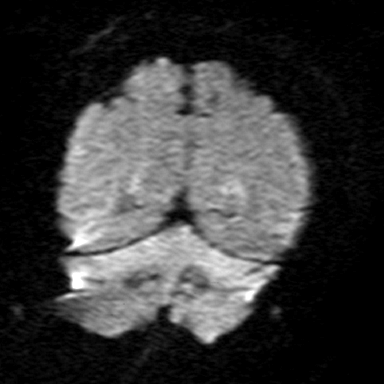
[im 34/34]
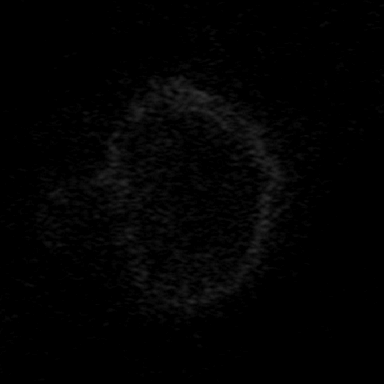

[Series 13: DWI · coronal · 5.0mm · 0.45mm/px · 5 of 34 slices shown (6 of 6)]
[im 1/34]
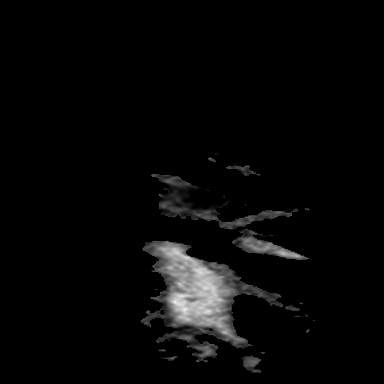
[im 9/34]
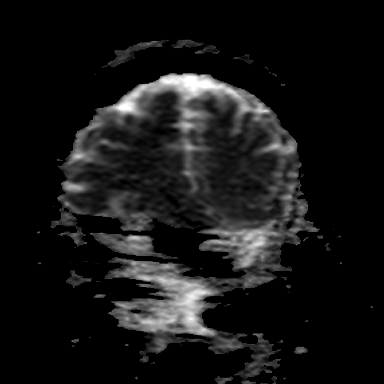
[im 17/34]
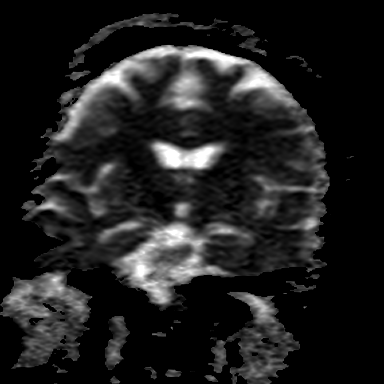
[im 25/34]
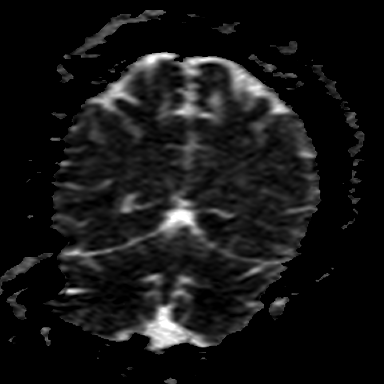
[im 34/34]
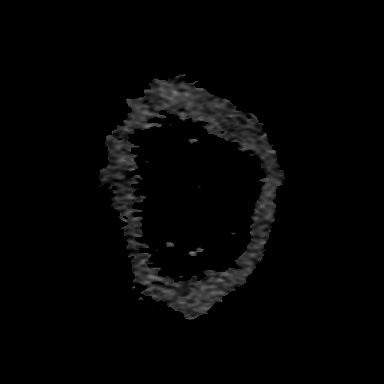

[Series 14: T2 · axial · 5.0mm · 0.75mm/px · z∈[-122,+20]mm · 3 of 23 slices shown]
[im 1/23]
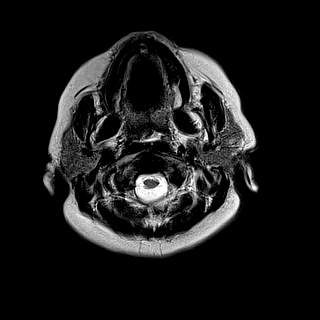
[im 12/23]
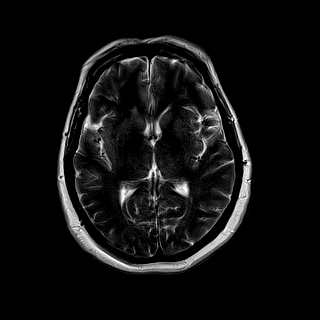
[im 23/23]
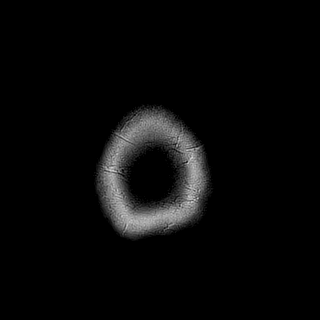

[Series 15: FLAIR · axial · 5.0mm · 1.02mm/px · z∈[-122,+21]mm · 3 of 23 slices shown]
[im 1/23]
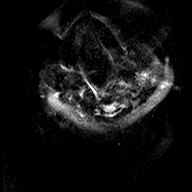
[im 12/23]
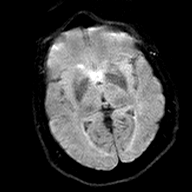
[im 23/23]
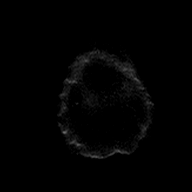

[47 of 48 positions shown; findings below may reference images not displayed]

FINDINGS: The examination had to be discontinued prior to completion due to
claustrophobia. Intermittently motion degraded diffusion-weighted
imaging, sagittal T1 weighted imaging, axial T2 and FLAIR imaging
was obtained.

Brain: Stable cerebral volume. No midline shift, mass effect, or
evidence of intracranial mass lesion. No ventriculomegaly. No
evidence of acute intracranial hemorrhage or extra-axial collection.

No restricted diffusion or evidence of acute infarction.

Patchy and scattered chronic bilateral cerebral white matter T2 and
FLAIR hyperintensity appears similar to the Um MRI. Suggestion
of new T2 hyperintensity in the left caudate nucleus near the left
frontal horn since [REDACTED] with facilitated diffusion (series 14,
image 14). Stable patchy T2 hyperintensity in the left pons. No
cortical encephalomalacia identified.

Vascular: Major intracranial vascular flow voids are grossly stable
since the Um MRI.

Skull and upper cervical spine: Not well evaluated.

Sinuses/Orbits: Appear stable.

Other: Stable trace mastoid fluid. Small volume retained secretions
in the nasopharynx today. Stable scalp soft tissues.
IMPRESSION: 1. Intermittently motion degraded study which had to be discontinued
prior to completion despite repeated imaging attempts.
2.  No acute intracranial abnormality identified.
3. Questionable chronic lacunar infarct in the left caudate which is
new since [REDACTED]. Otherwise stable nonspecific cerebral white
matter and pontine signal changes.

## 2019-01-21 IMAGING — RF DG FLUORO GUIDE LUMBAR PUNCTURE
2 series · 2 of 2 positions shown · non-contrast
Comparison: none

CLINICAL DATA: MS.

[Series 1: fluoro_myelogram_singleshot_bw · 0.17mm/px · 1 of 1 slices shown]
[im 1/1]
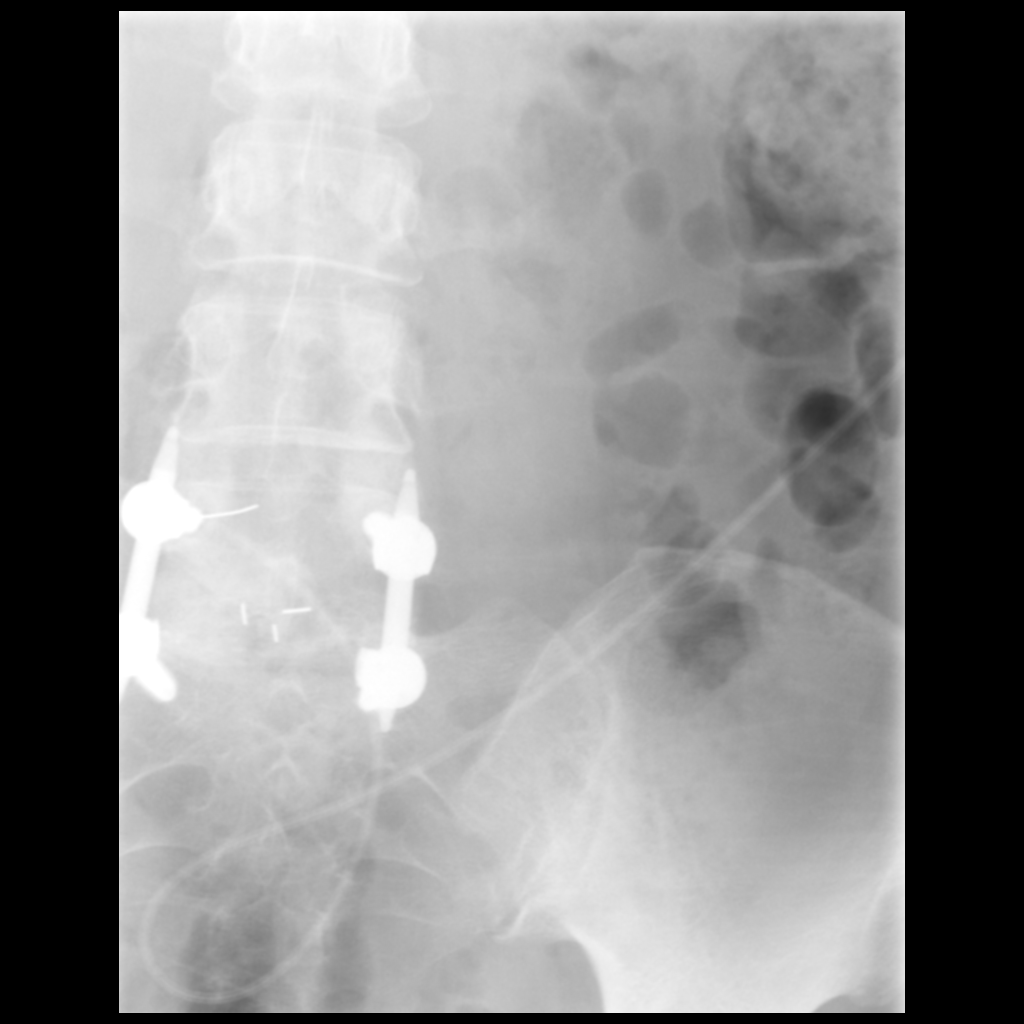

[Series 2: cp_standard · 0.17mm/px · 1 of 1 slices shown]
[im 1/1]
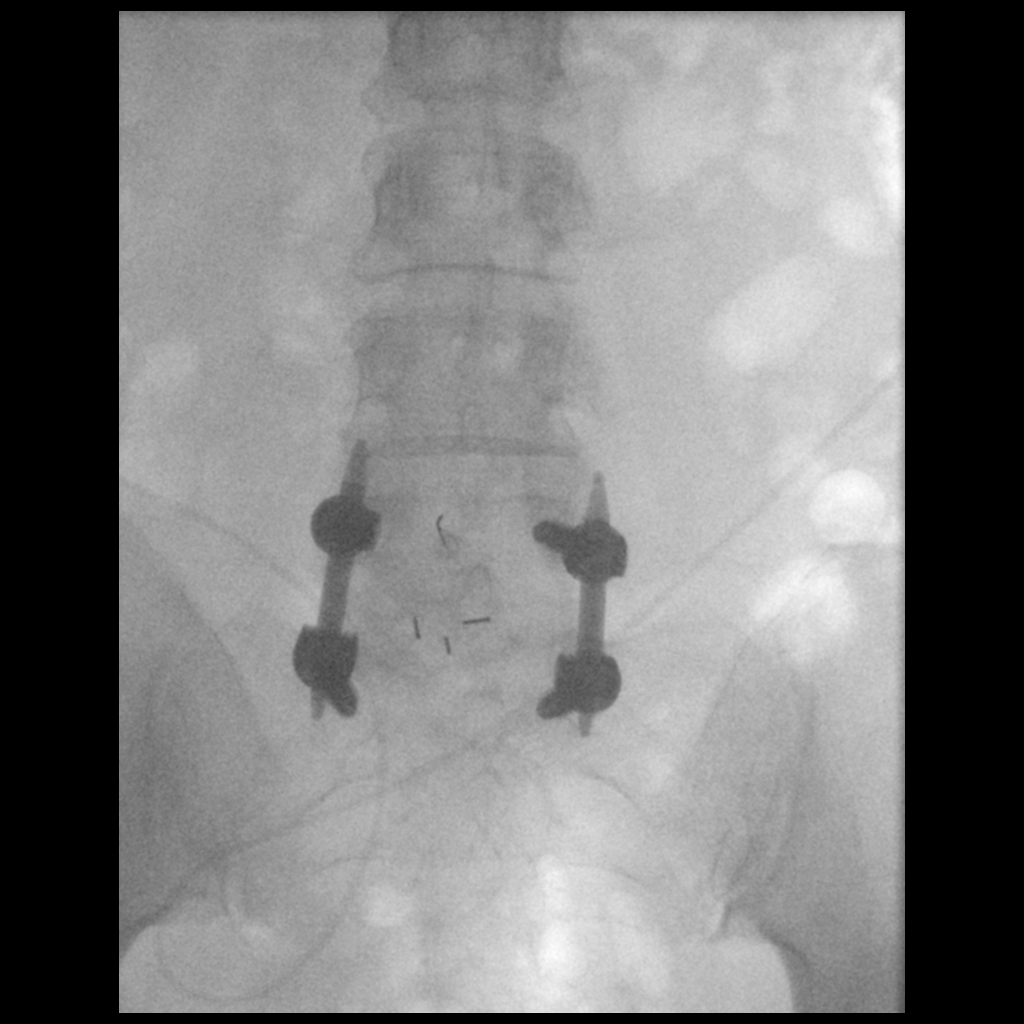

[2 of 2 positions shown; findings below may reference images not displayed]

EXAM:
DIAGNOSTIC LUMBAR PUNCTURE UNDER FLUOROSCOPIC GUIDANCE

FLUOROSCOPY TIME:  Fluoroscopy Time:  0 minutes 48 seconds

Radiation Exposure Index (if provided by the fluoroscopic device):
25.9 mGy

Number of Acquired Spot Images: 1

PROCEDURE:
Informed consent was obtained from the patient prior to the
procedure, including potential complications of headache, allergy,
and pain. With the patient prone, the lower back was prepped with
Betadine. 1% Lidocaine was used for local anesthesia. Lumbar
puncture was performed at the L4-L5 level using a 22 gauge needle
with return of clear CSF with an opening pressure of 10 cm water.
Ten ml of CSF were obtained for laboratory studies. The patient
tolerated the procedure well and there were no apparent
complications.
IMPRESSION: Successful fluoroscopically directed lumbar puncture.

## 2019-01-21 IMAGING — CT CT HEAD W/O CM
4 series · 16 of 47 positions shown, 18 images · non-contrast
Comparison: CT of the head performed 05/16/2017, and MRI of the
brain performed 05/18/2017

CLINICAL DATA: Subacute onset of altered level of consciousness.
Initial encounter.

EXAM:
CT HEAD WITHOUT CONTRAST
TECHNIQUE: Contiguous axial images were obtained from the base of the skull
through the vertex without intravenous contrast.

[Series 2: head trauma wo · axial · 0.39mm/px · z∈[+14,+134]mm · 7 of 33 slices shown, 9 images]
[im 5/33  brain]
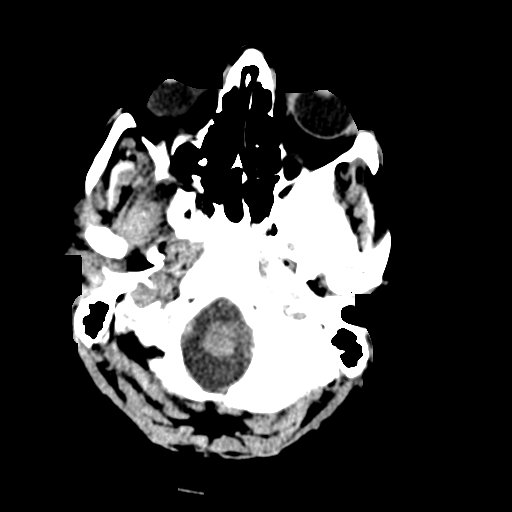
[im 5/33  bone]
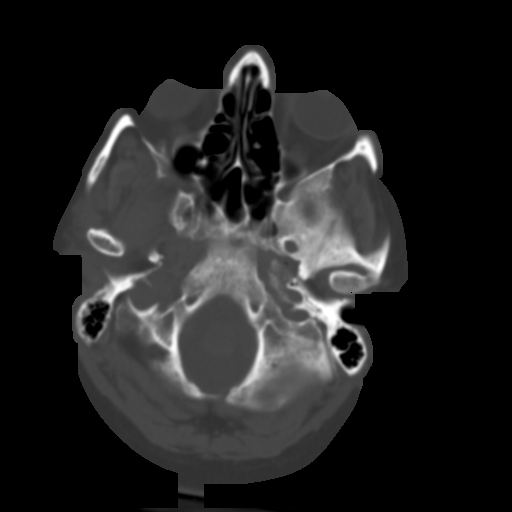
[im 9/33  brain]
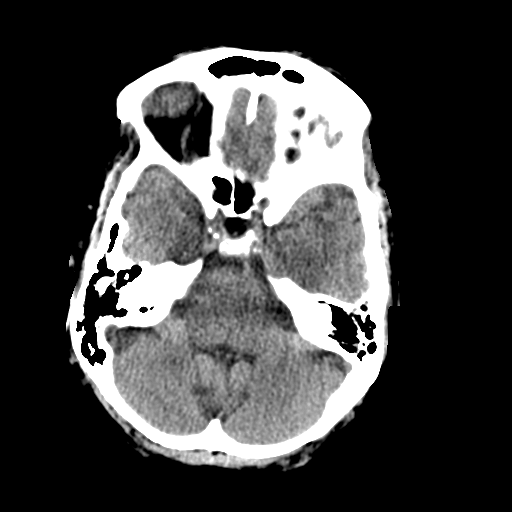
[im 13/33  brain]
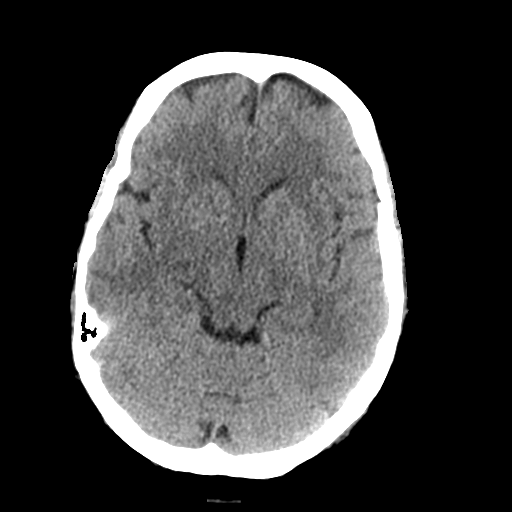
[im 17/33  brain]
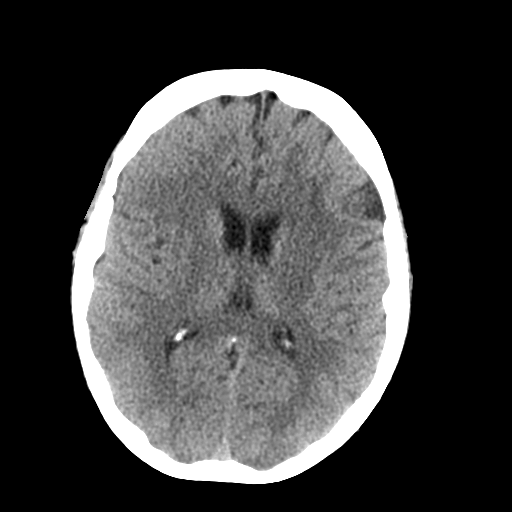
[im 21/33  brain]
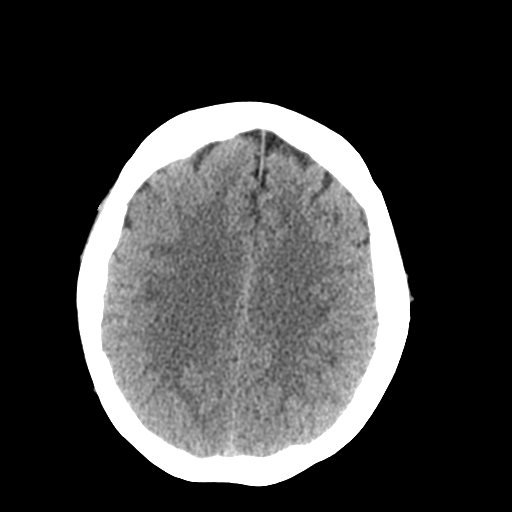
[im 21/33  bone]
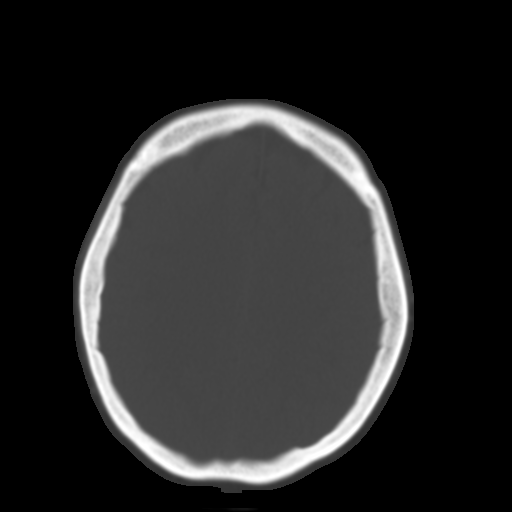
[im 25/33  brain]
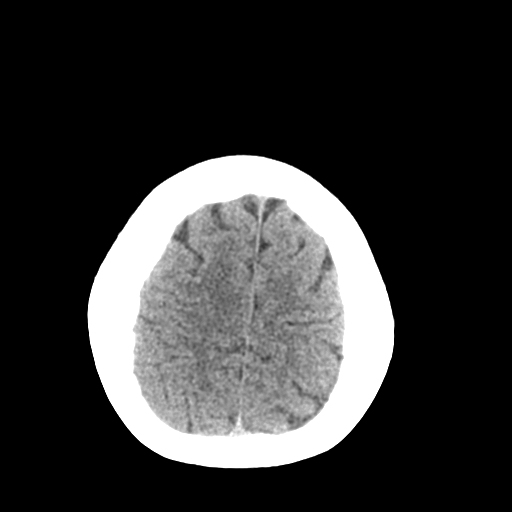
[im 29/33  brain]
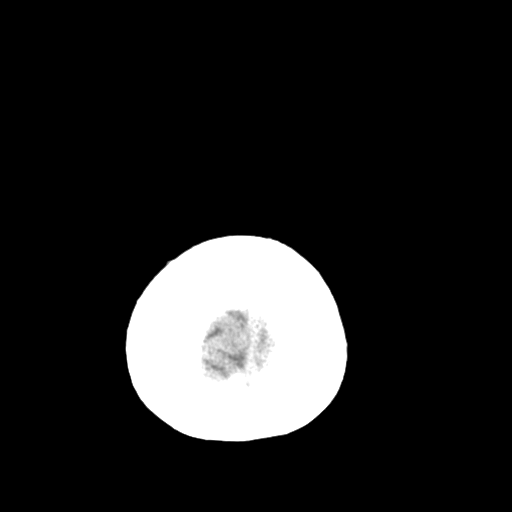

[Series 3: head bone · axial · 0.39mm/px · z∈[+10,+42]mm · 3 of 82 slices shown]
[im 9/82  bone]
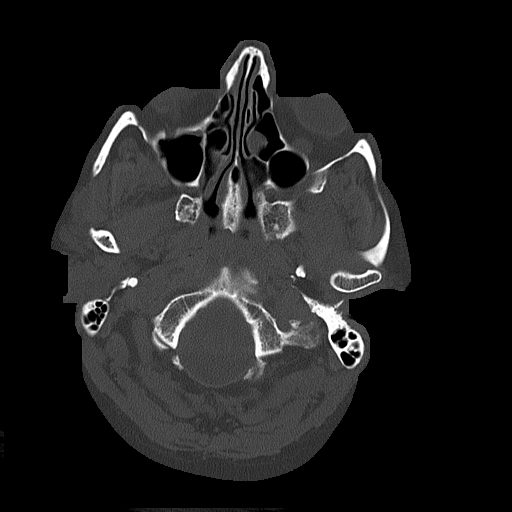
[im 17/82  bone]
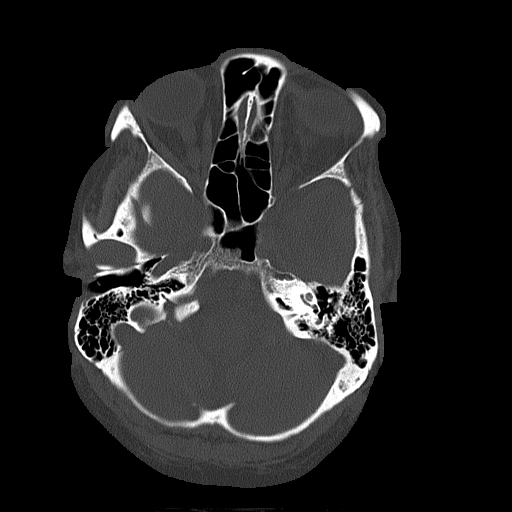
[im 25/82  bone]
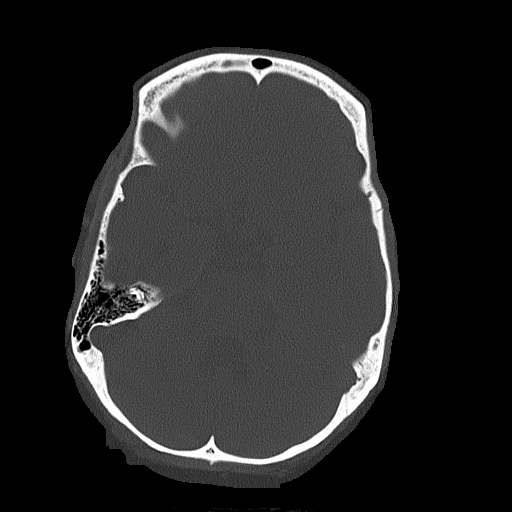

[Series 4: coronal soft tissue · coronal · 0.33mm/px · 3 of 58 slices shown]
[im 20/58  brain]
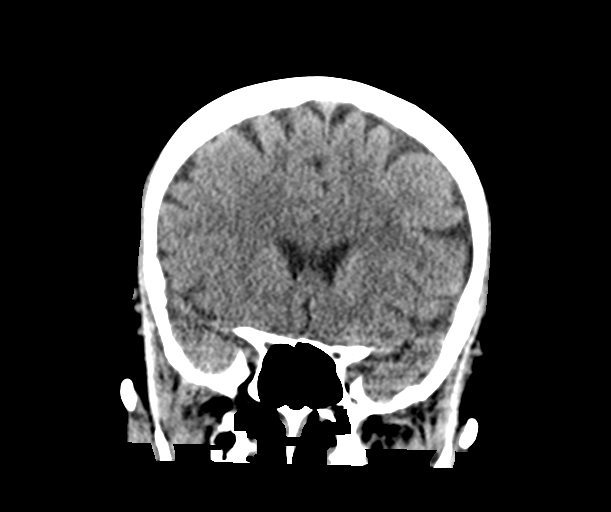
[im 26/58  brain]
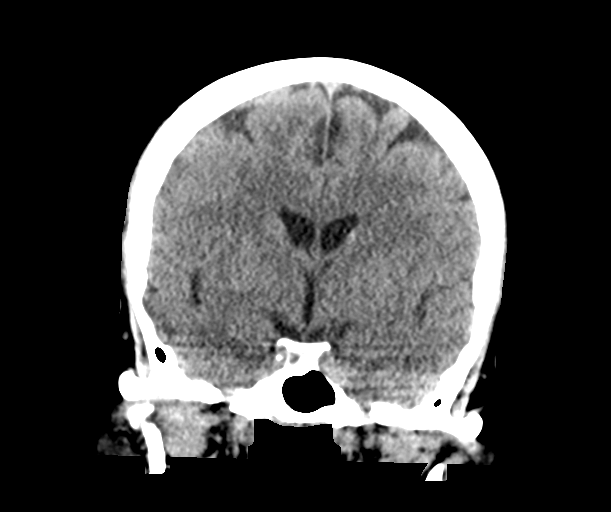
[im 32/58  brain]
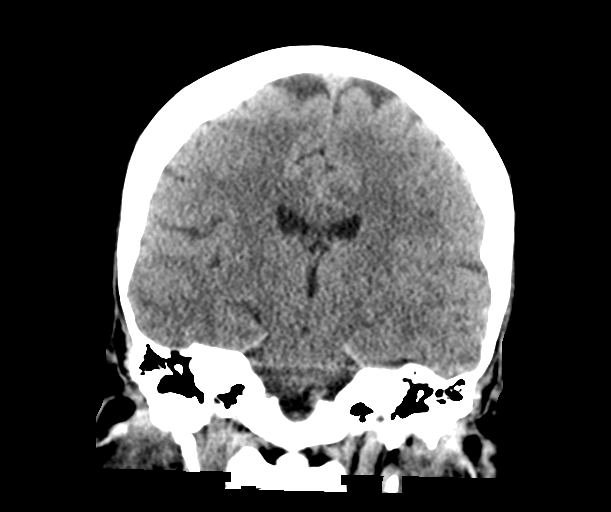

[Series 5: sagittal soft tissue · sagittal · 0.34mm/px · 3 of 55 slices shown]
[im 19/55  brain]
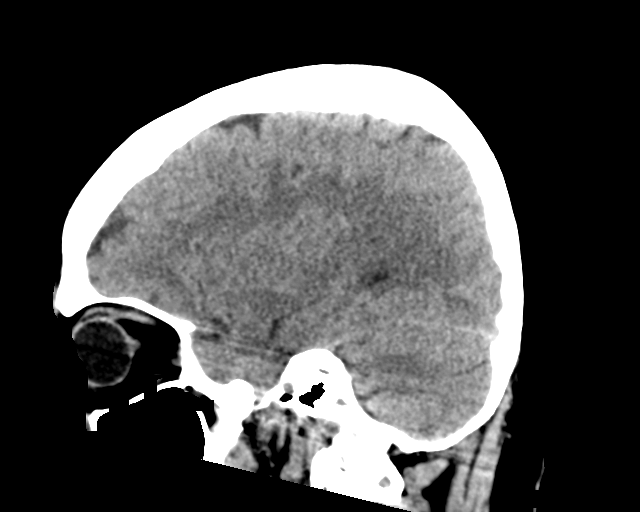
[im 28/55  brain]
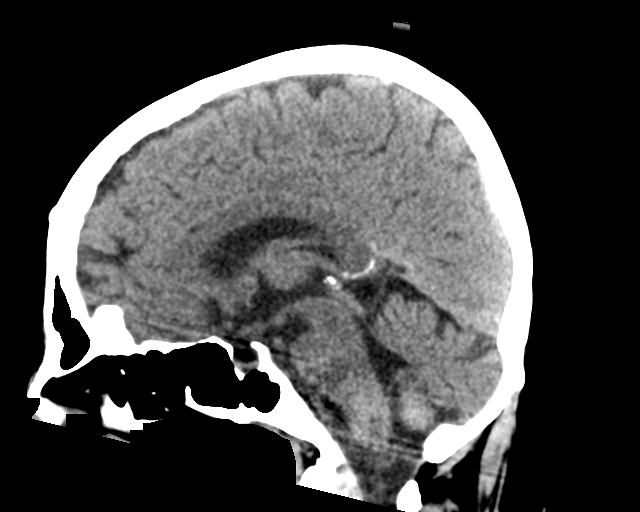
[im 37/55  brain]
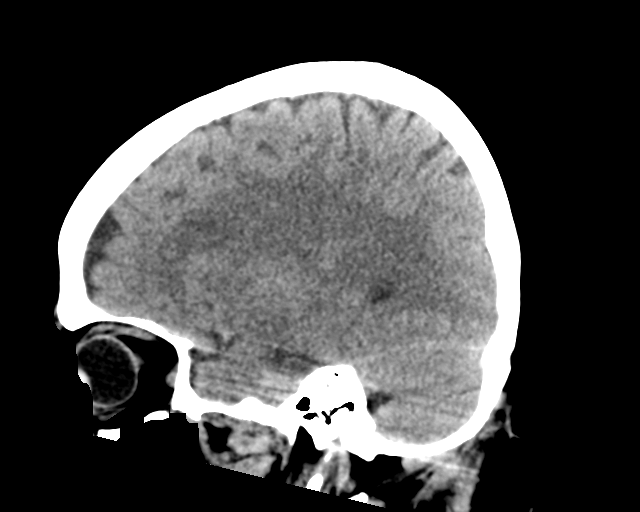

[16 of 47 positions shown; findings below may reference images not displayed]

FINDINGS: Brain: No evidence of acute infarction, hemorrhage, hydrocephalus,
extra-axial collection or mass lesion/mass effect.

The posterior fossa, including the cerebellum, brainstem and fourth
ventricle, is within normal limits. The third and lateral
ventricles, and basal ganglia are unremarkable in appearance. The
cerebral hemispheres are symmetric in appearance, with normal
gray-white differentiation. No mass effect or midline shift is seen.

Vascular: No hyperdense vessel or unexpected calcification.

Skull: There is no evidence of fracture; visualized osseous
structures are unremarkable in appearance.

Sinuses/Orbits: The orbits are within normal limits. The paranasal
sinuses and mastoid air cells are well-aerated.

Other: No significant soft tissue abnormalities are seen.
IMPRESSION: Unremarkable noncontrast CT of the head.

## 2019-02-20 IMAGING — DX DG HIP (WITH OR WITHOUT PELVIS) 2-3V*R*
3 series · 3 of 3 positions shown · non-contrast
Comparison: 04/25/2017.

CLINICAL DATA: Right hip pain following a fall at home today.

EXAM:
DG HIP (WITH OR WITHOUT PELVIS) 2-3V RIGHT

[pelvis ap]
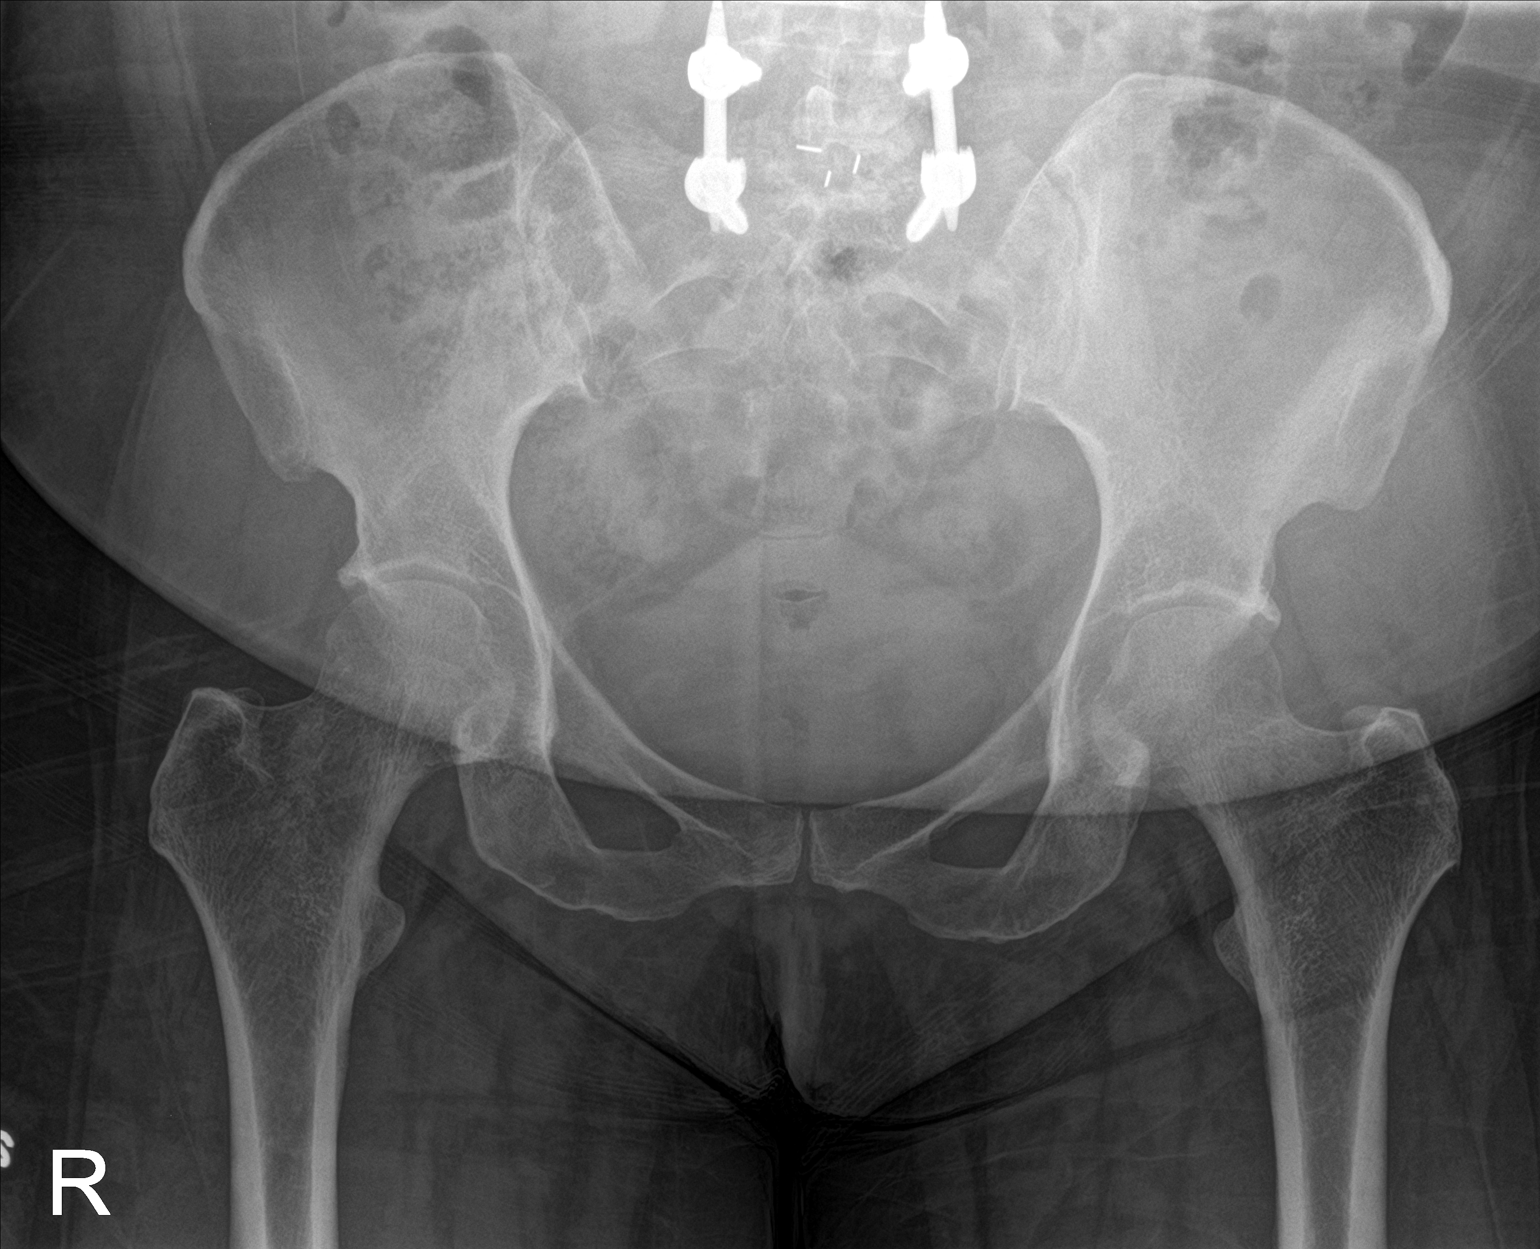

[hip ap]
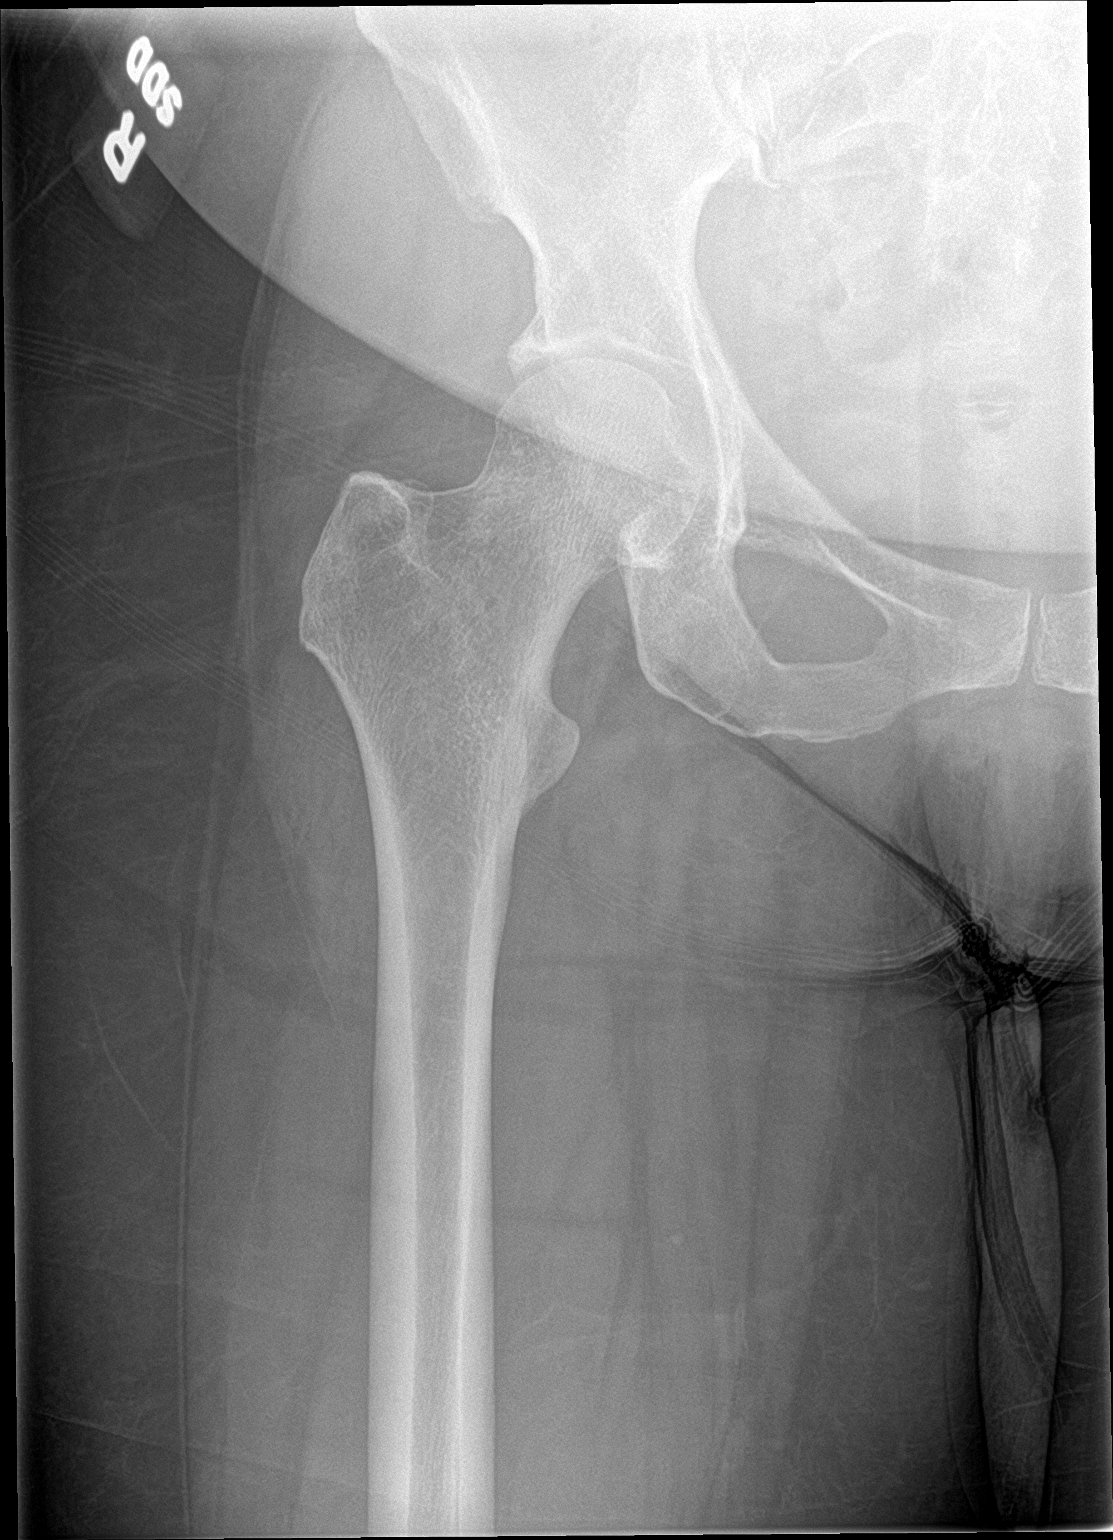

[hip lat]
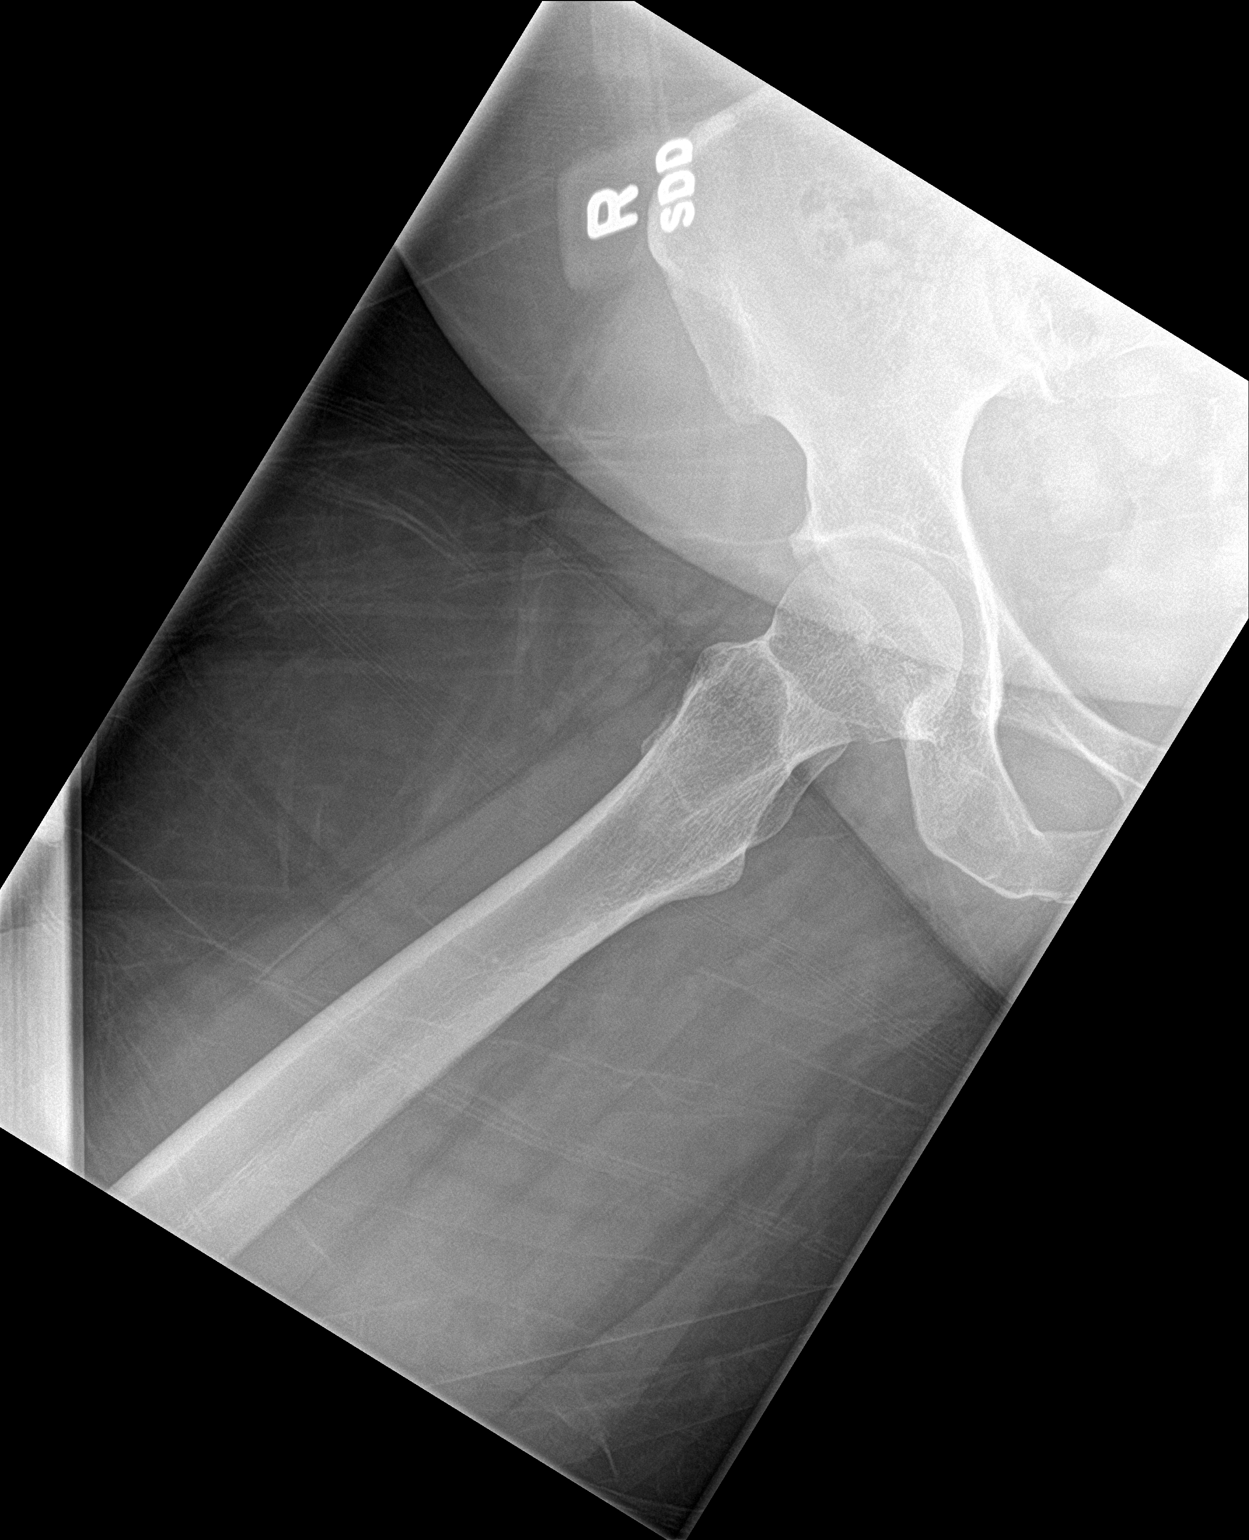

[3 of 3 positions shown; findings below may reference images not displayed]

FINDINGS: Normal appearing right hip without fracture or dislocation. Lower
lumbar spine postoperative changes with fixation hardware.
IMPRESSION: No fracture or dislocation.

## 2019-02-20 IMAGING — DX DG SHOULDER 2+V*R*
2 series · 2 of 2 positions shown · non-contrast
Comparison: None.

CLINICAL DATA: Fall

EXAM:
RIGHT SHOULDER - 2+ VIEW

[shoulder grashey]
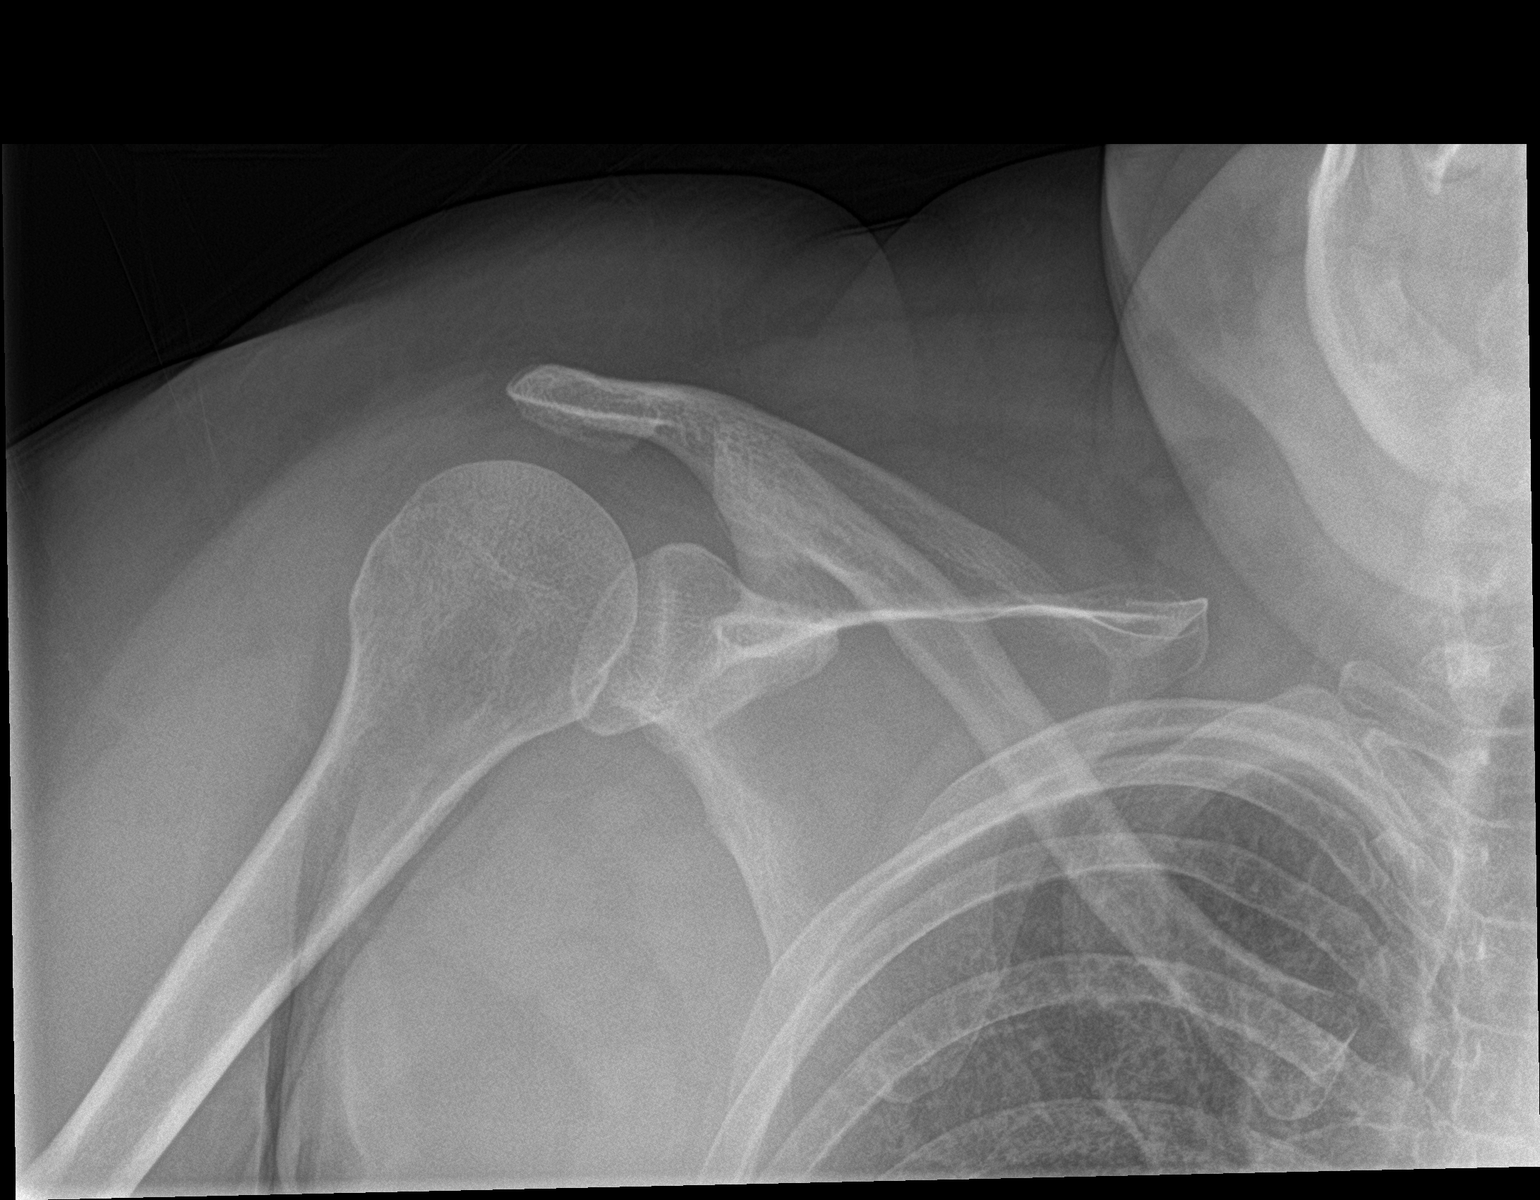

[shoulder y view]
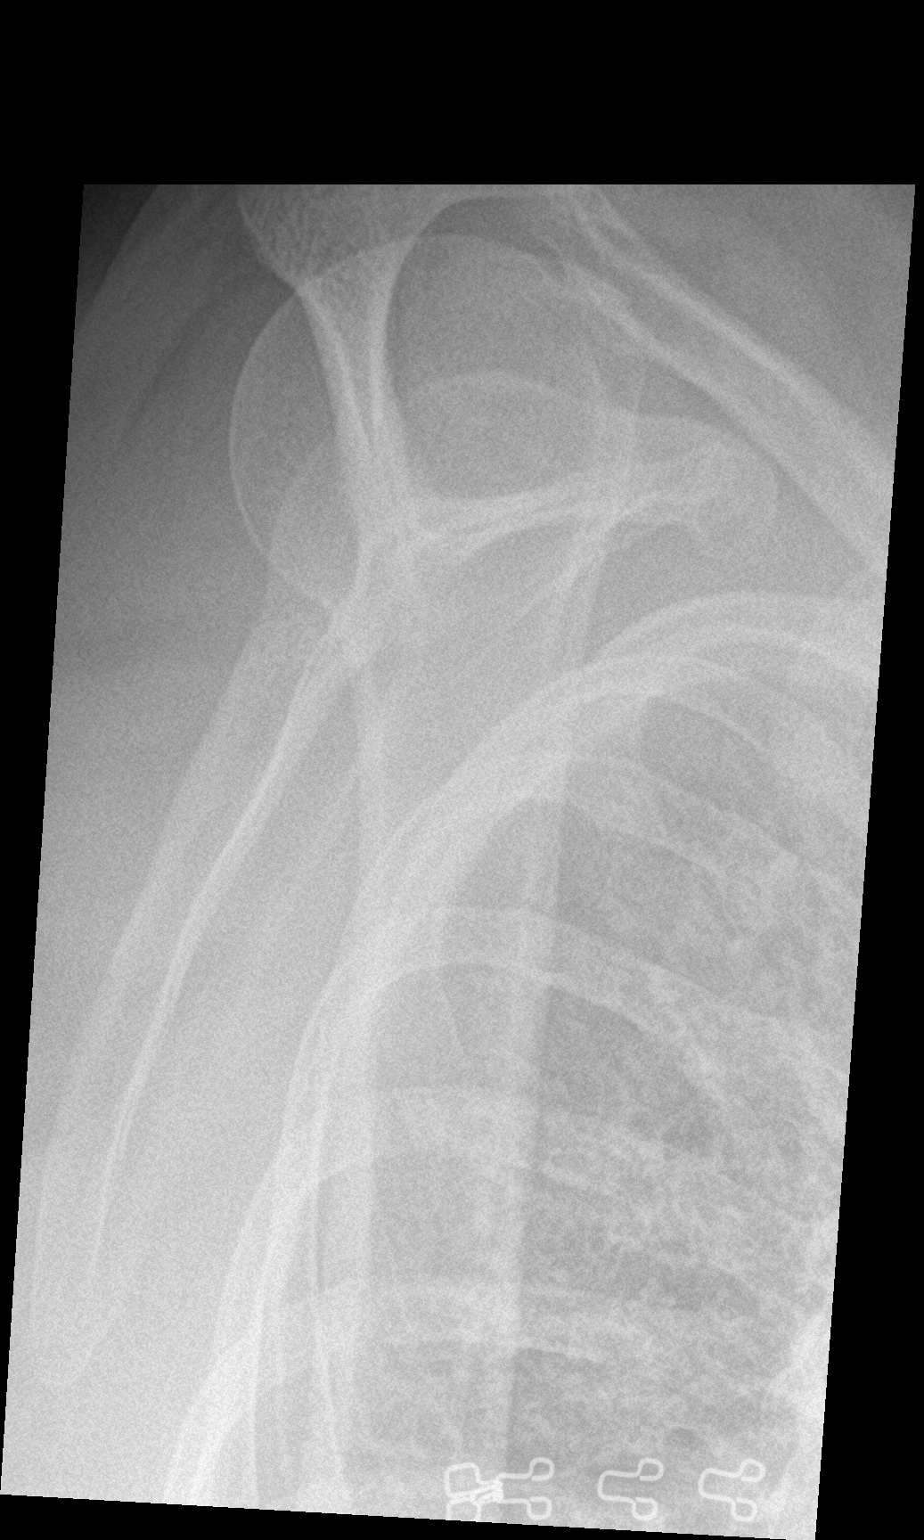

[2 of 2 positions shown; findings below may reference images not displayed]

FINDINGS: No acute fracture. No dislocation.  Unremarkable soft tissues.
IMPRESSION: No acute bony pathology.

## 2019-02-20 IMAGING — DX DG CLAVICLE*R*
2 series · 2 of 2 positions shown · non-contrast
Comparison: Right shoulder obtained earlier today.

CLINICAL DATA: Right clavicle pain following a fall at home today.

EXAM:
RIGHT CLAVICLE - 2+ VIEWS

[clavicle ap]
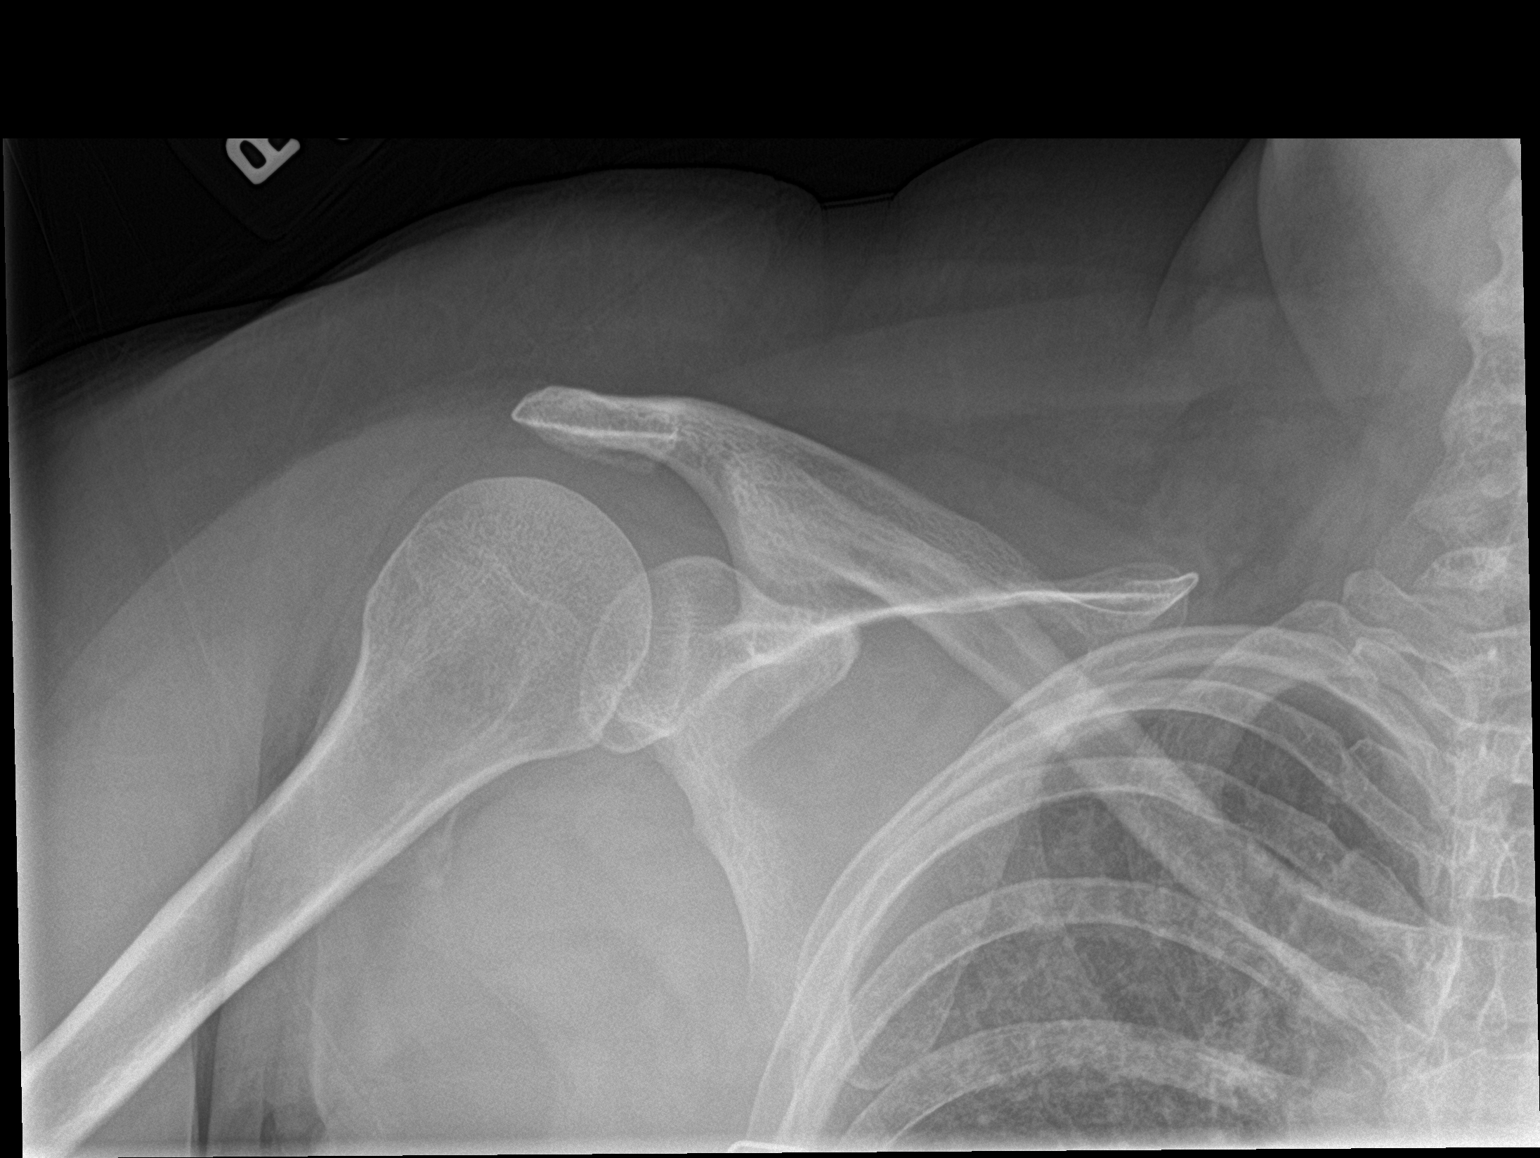

[clavicle axial]
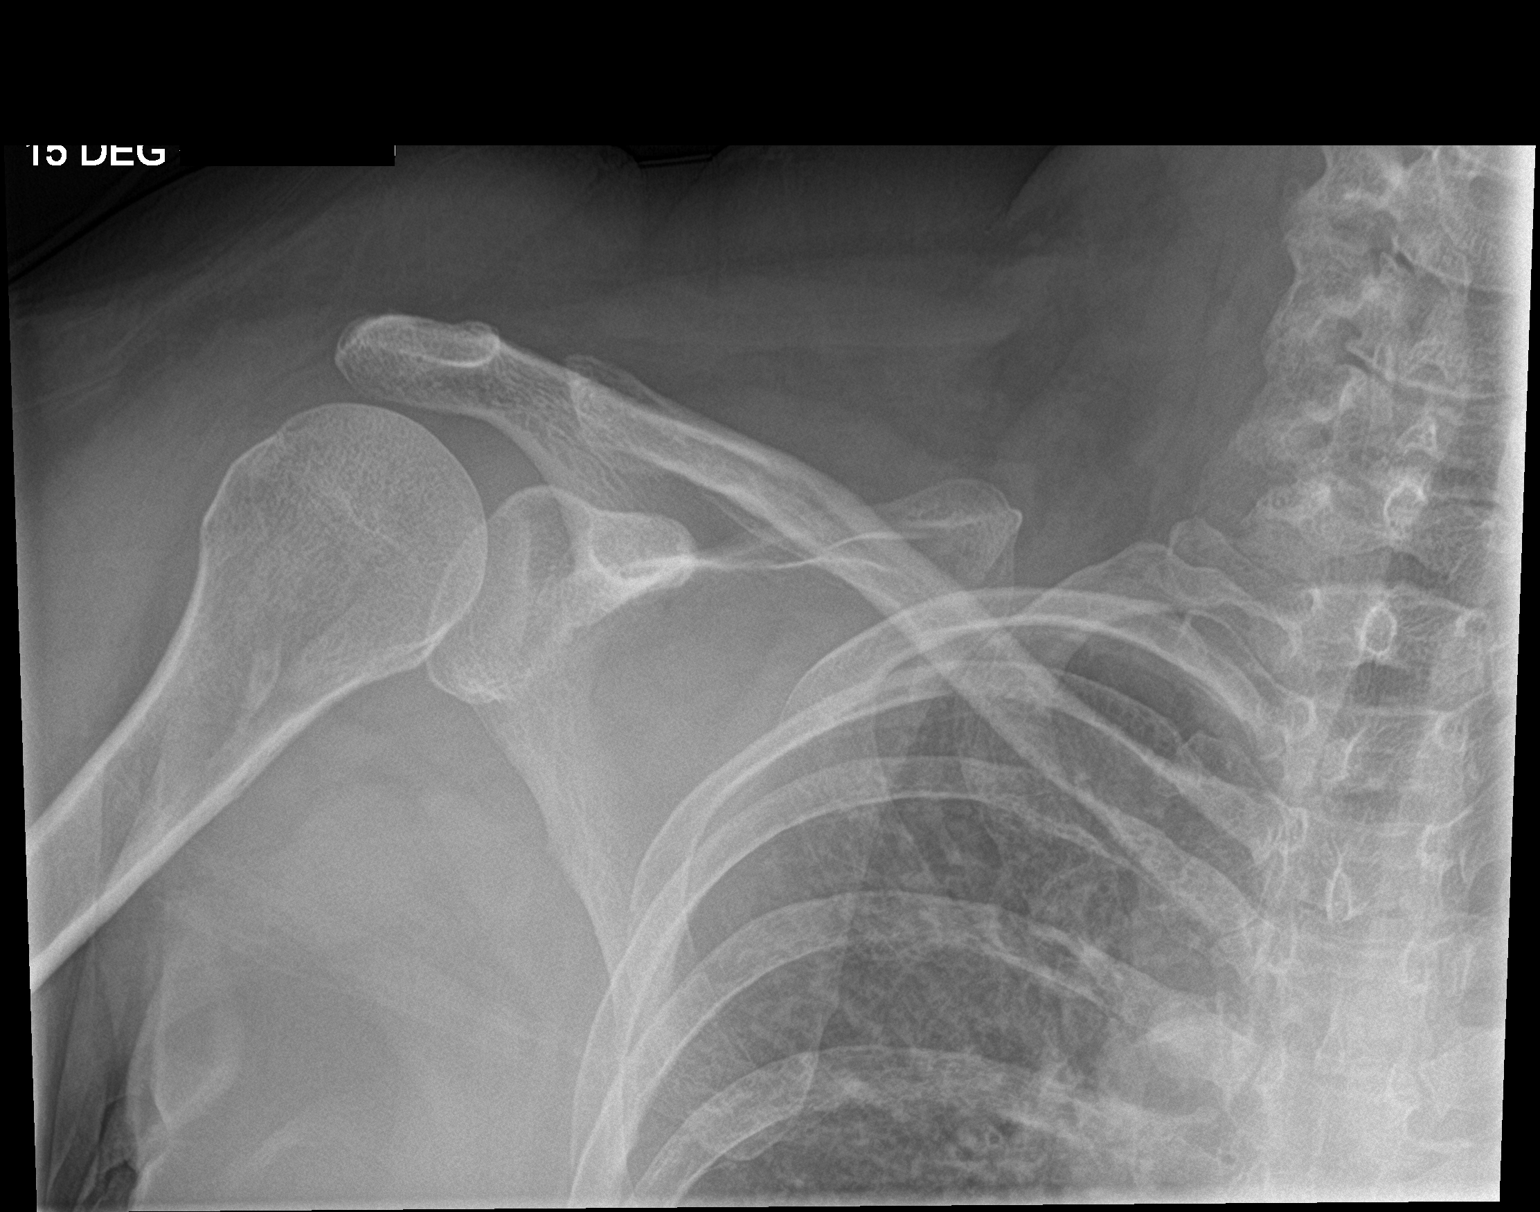

[2 of 2 positions shown; findings below may reference images not displayed]

FINDINGS: There is no evidence of fracture or other focal bone lesions. Soft
tissues are unremarkable.
IMPRESSION: No fracture or dislocation.

## 2019-02-20 IMAGING — DX DG ELBOW COMPLETE 3+V*R*
4 series · 4 of 4 positions shown · non-contrast
Comparison: None.

CLINICAL DATA: Right elbow pain following a fall at home today.

EXAM:
RIGHT ELBOW - COMPLETE 3+ VIEW

[elbow ap (1 of 2)]
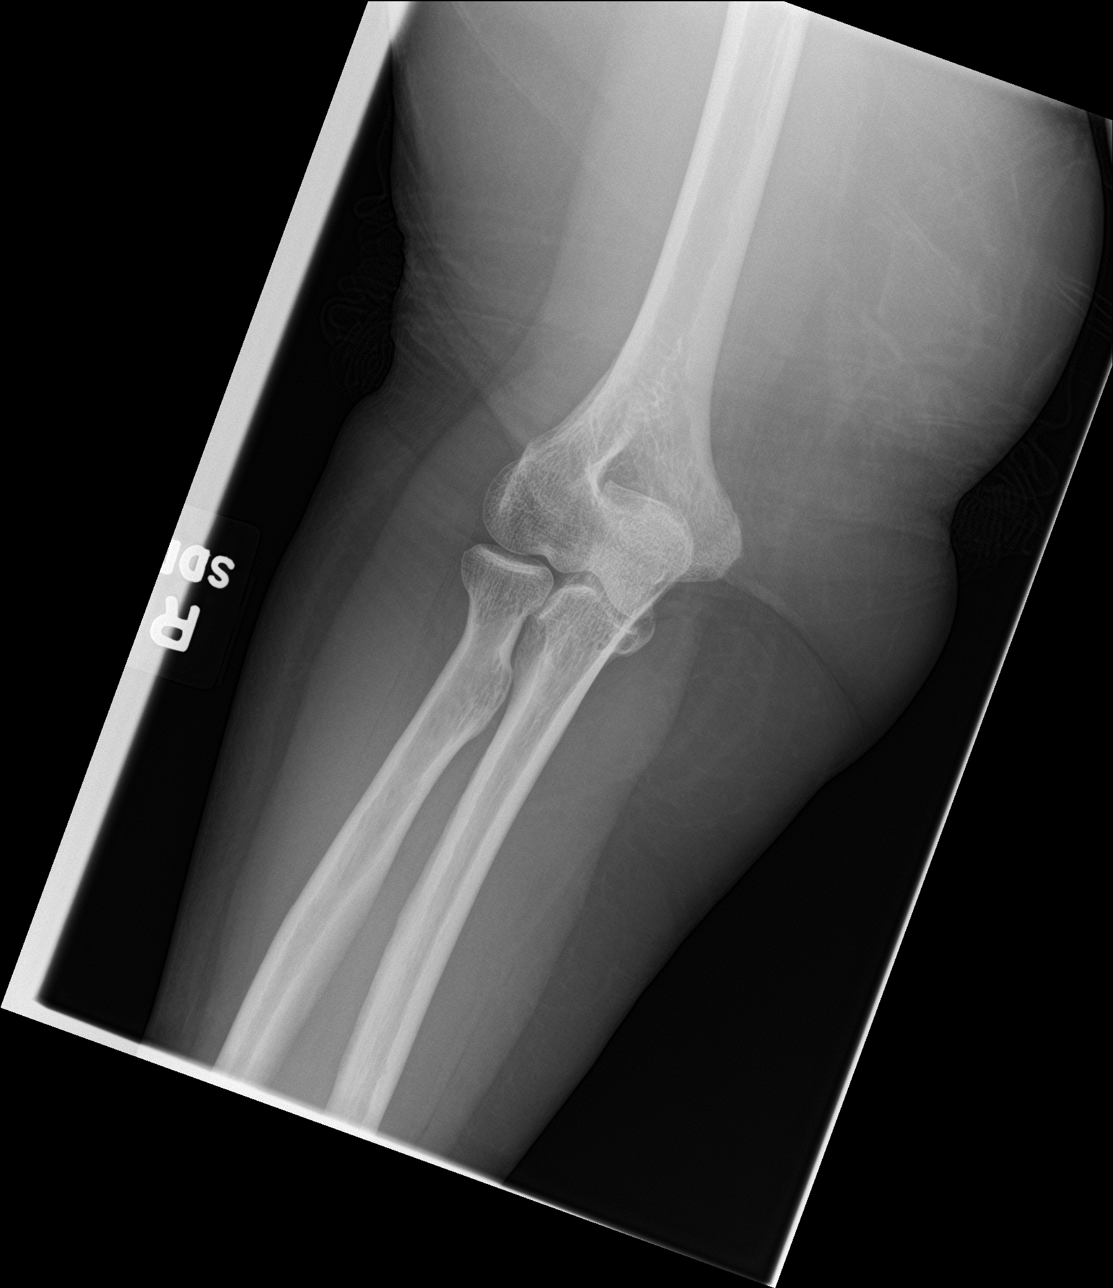

[elbow obl]
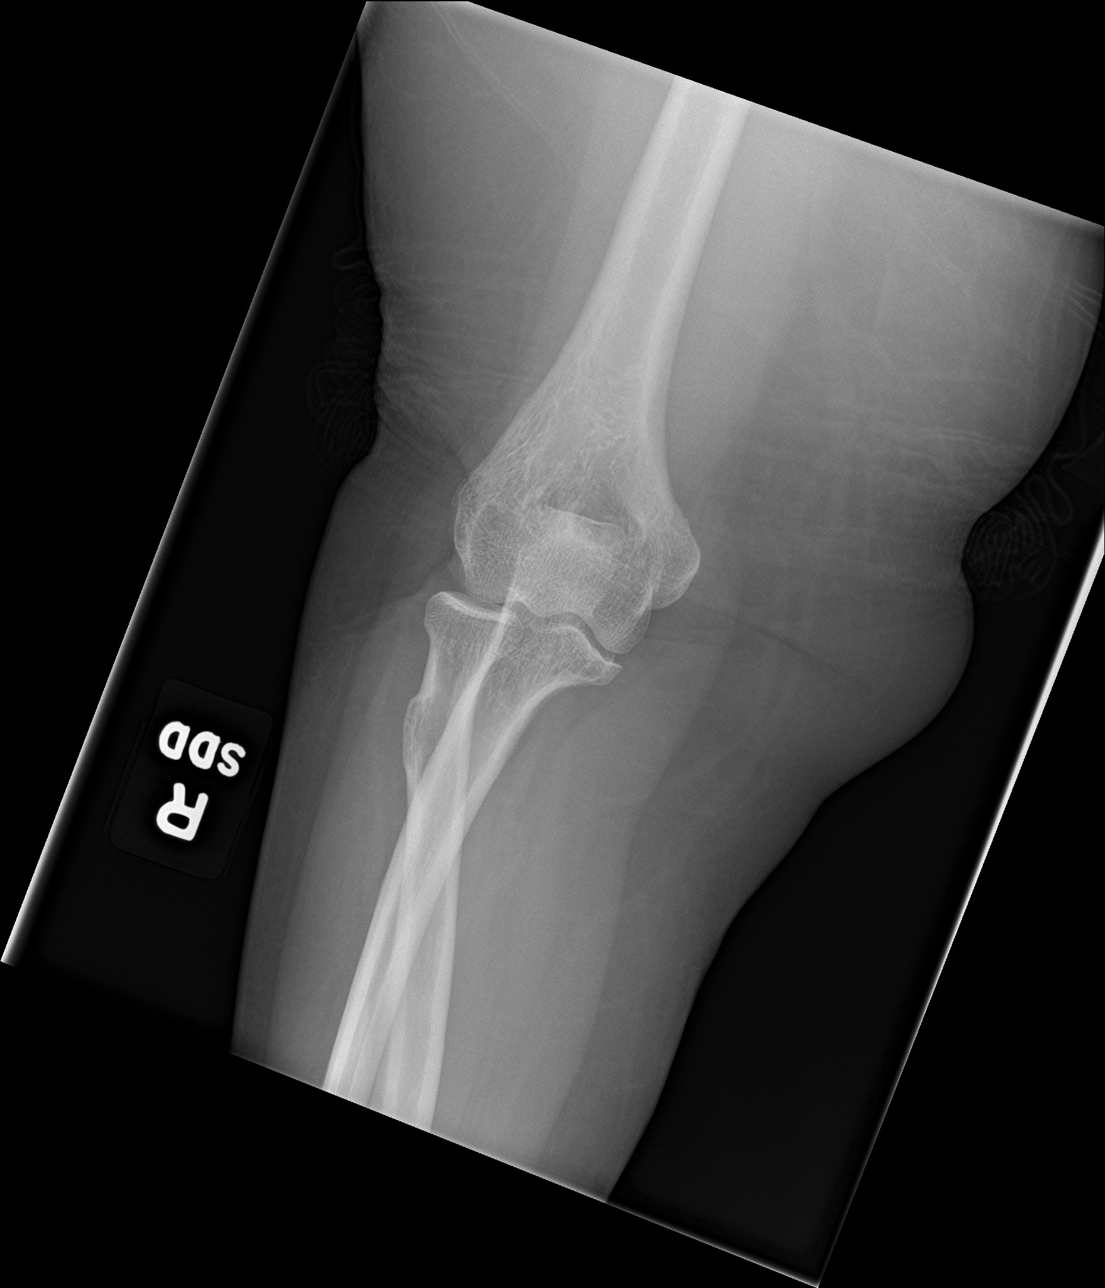

[elbow lat]
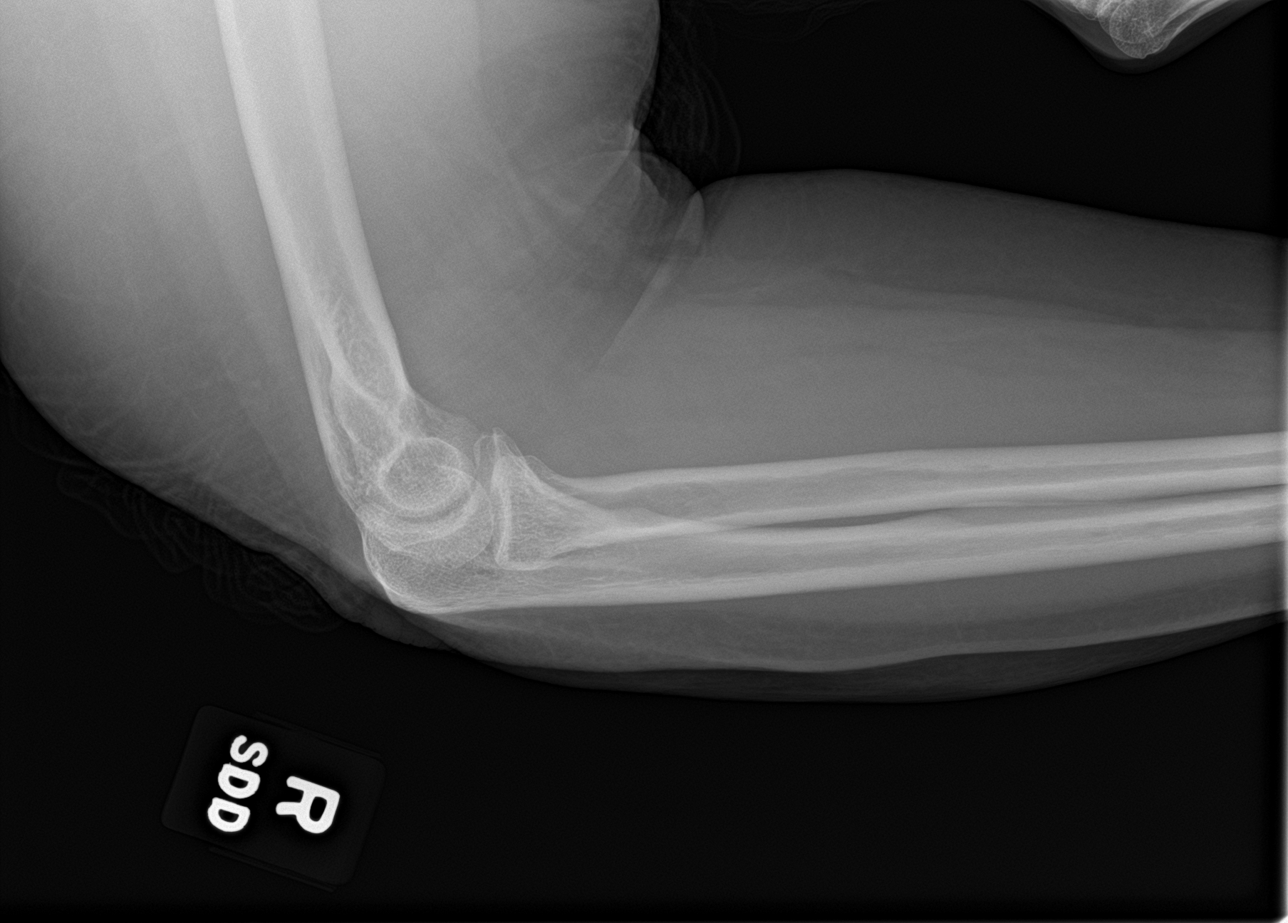

[elbow ap (2 of 2)]
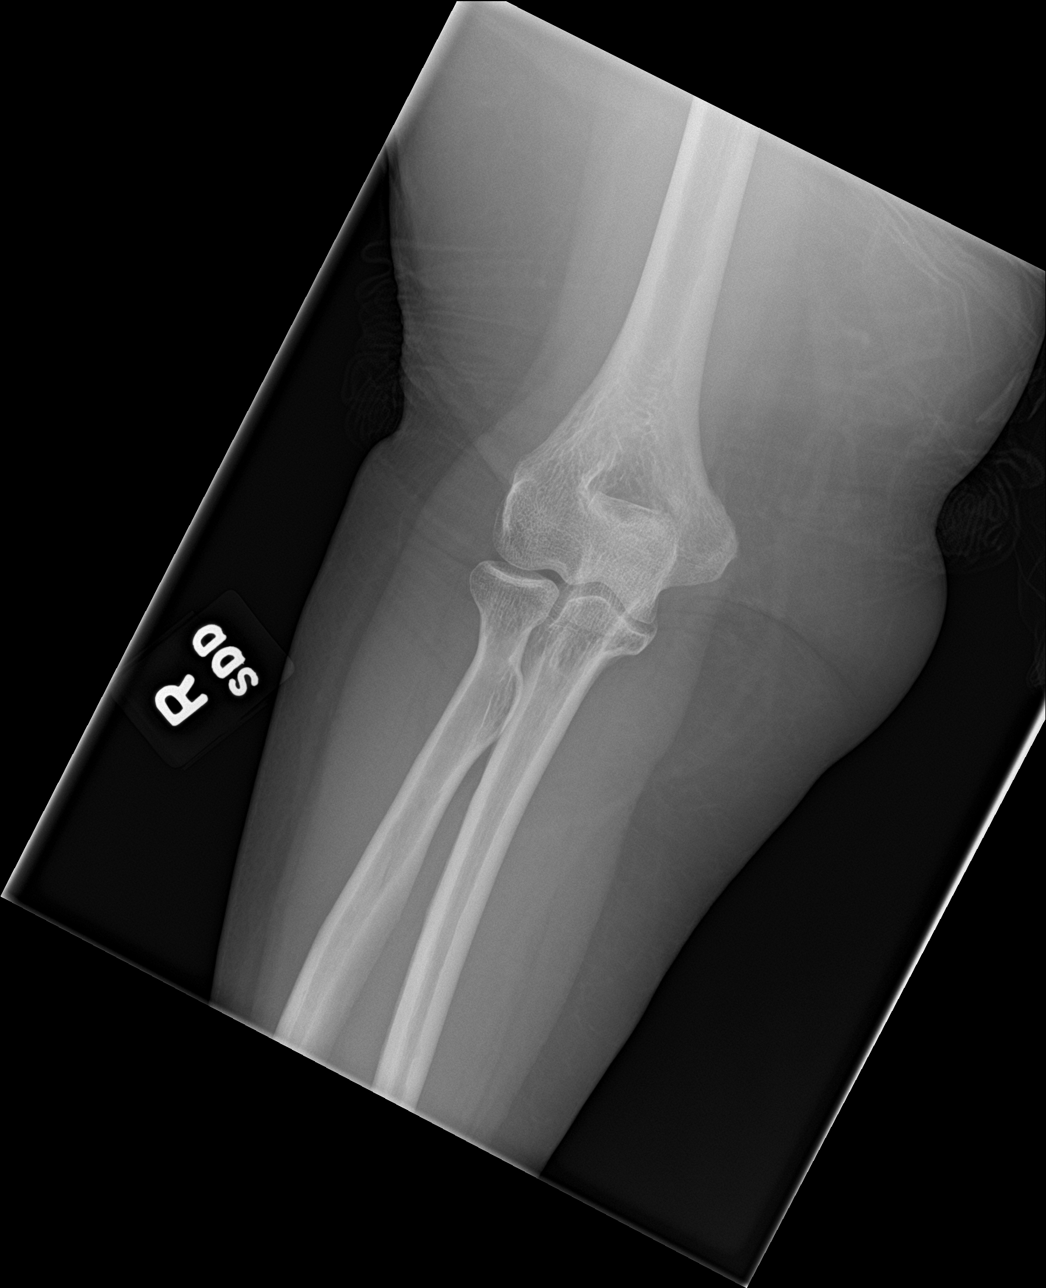

[4 of 4 positions shown; findings below may reference images not displayed]

FINDINGS: There is no evidence of fracture, dislocation, or joint effusion.
There is no evidence of arthropathy or other focal bone abnormality.
Soft tissues are unremarkable.
IMPRESSION: Normal examination.

## 2019-02-20 IMAGING — DX DG TIBIA/FIBULA 2V*L*
3 series · 3 of 3 positions shown · non-contrast
Comparison: Left ankle radiographs dated 02/17/2017.

CLINICAL DATA: Left lower leg pain following a fall at home today.

EXAM:
LEFT TIBIA AND FIBULA - 2 VIEW

[tibia ap (1 of 2)]
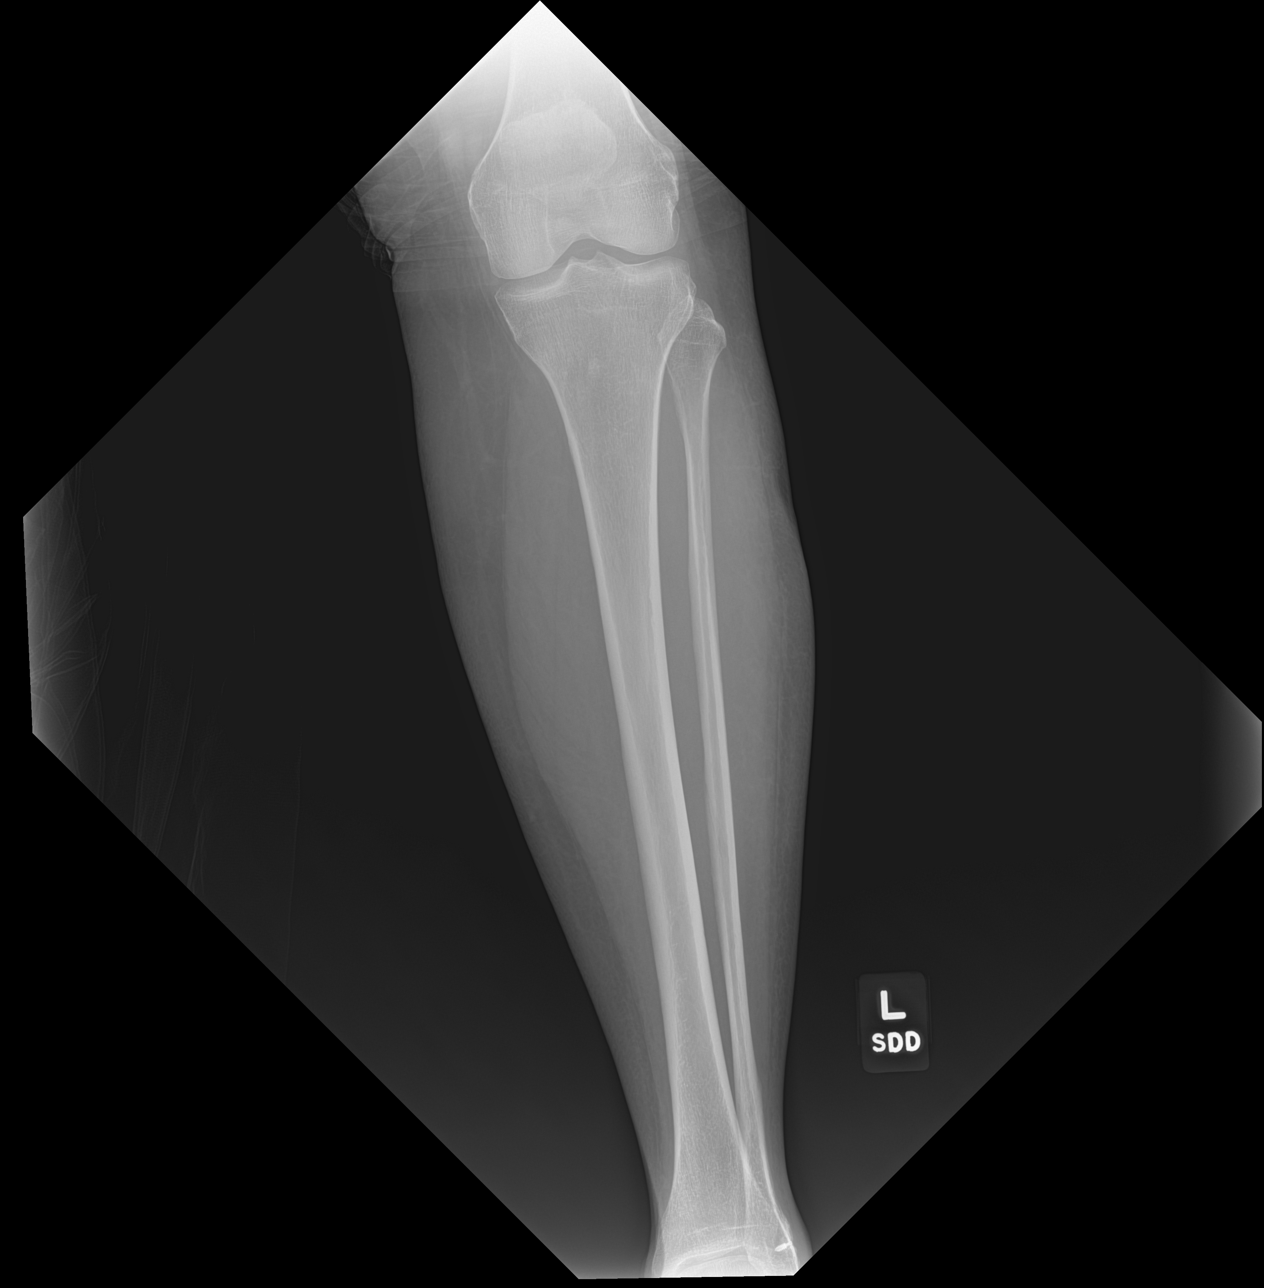

[tibia ap (2 of 2)]
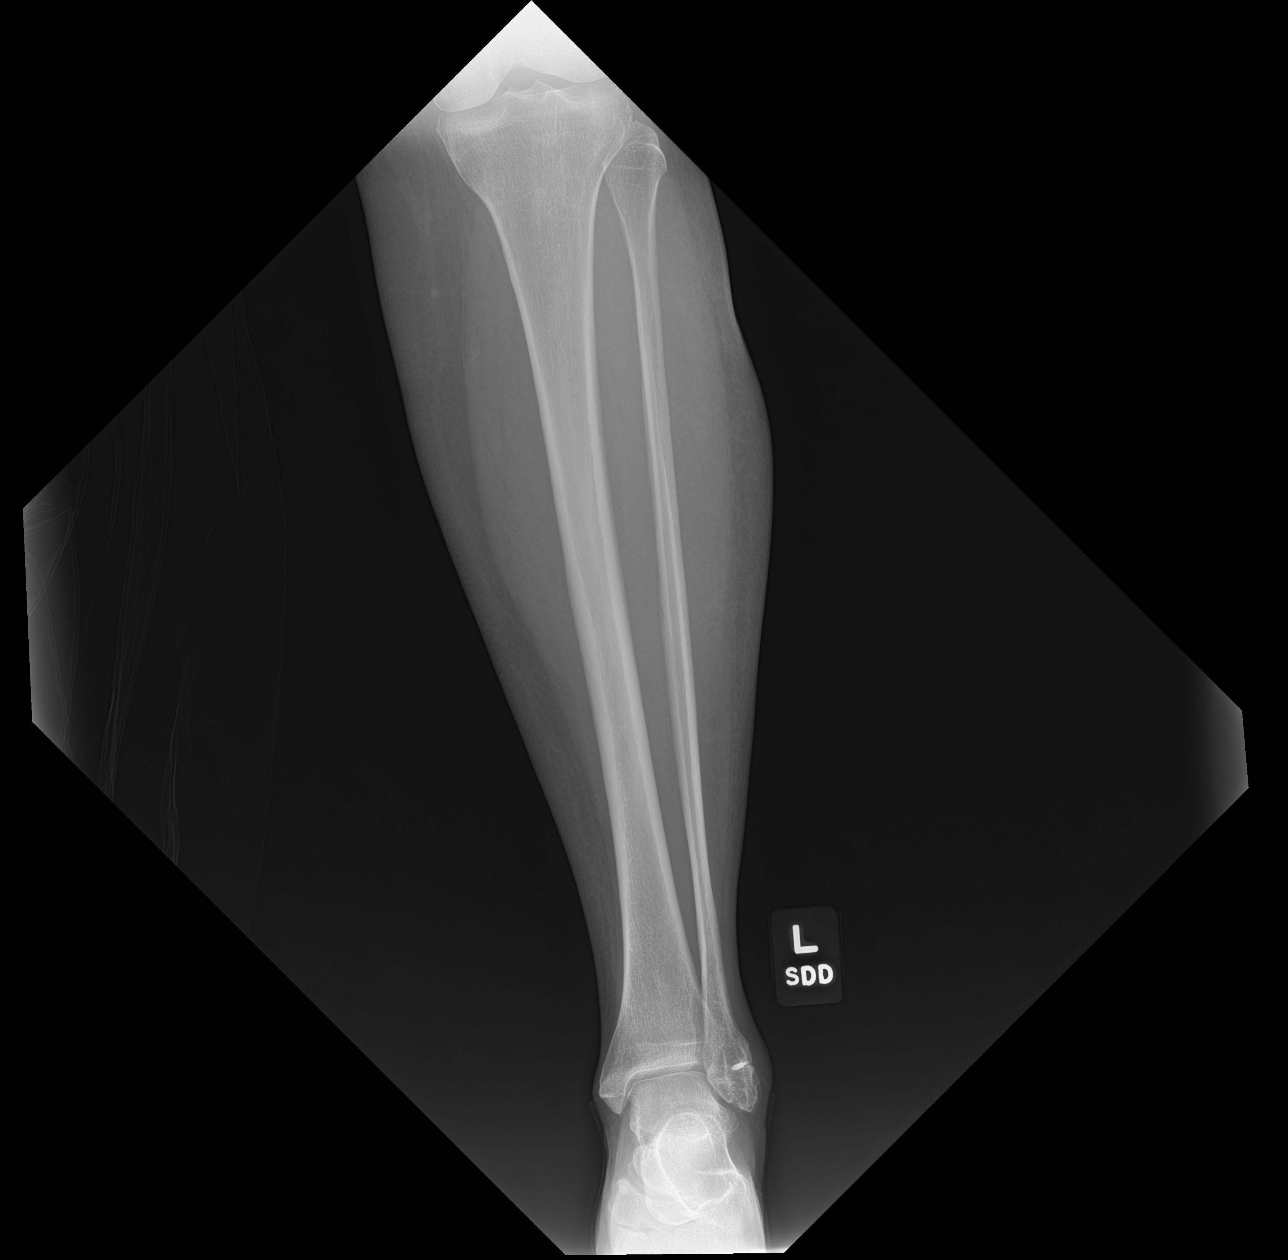

[tibia lat]
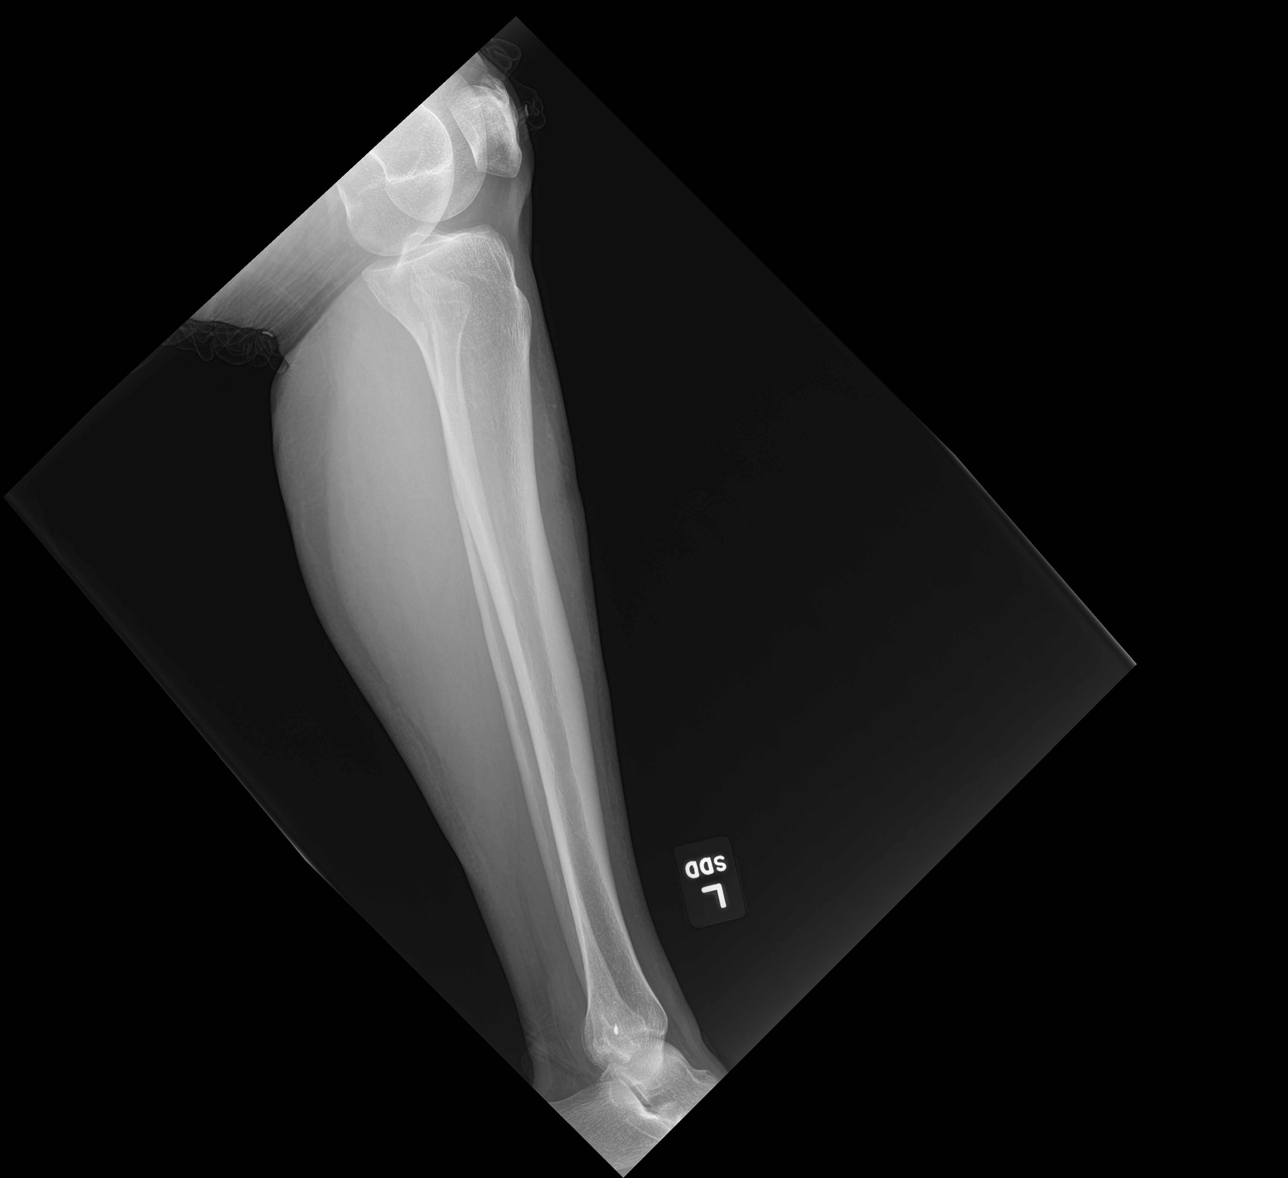

[3 of 3 positions shown; findings below may reference images not displayed]

FINDINGS: A lateral malleolus fixation screws again demonstrated. No fracture
or dislocation seen. Moderate-sized anterior patellar spurs.
IMPRESSION: No fracture or dislocation.

## 2019-02-20 IMAGING — DX DG WRIST COMPLETE 3+V*R*
4 series · 4 of 4 positions shown · non-contrast
Comparison: None.

CLINICAL DATA: Right wrist pain following a fall at home today.

EXAM:
RIGHT WRIST - COMPLETE 3+ VIEW

[wrist pa]
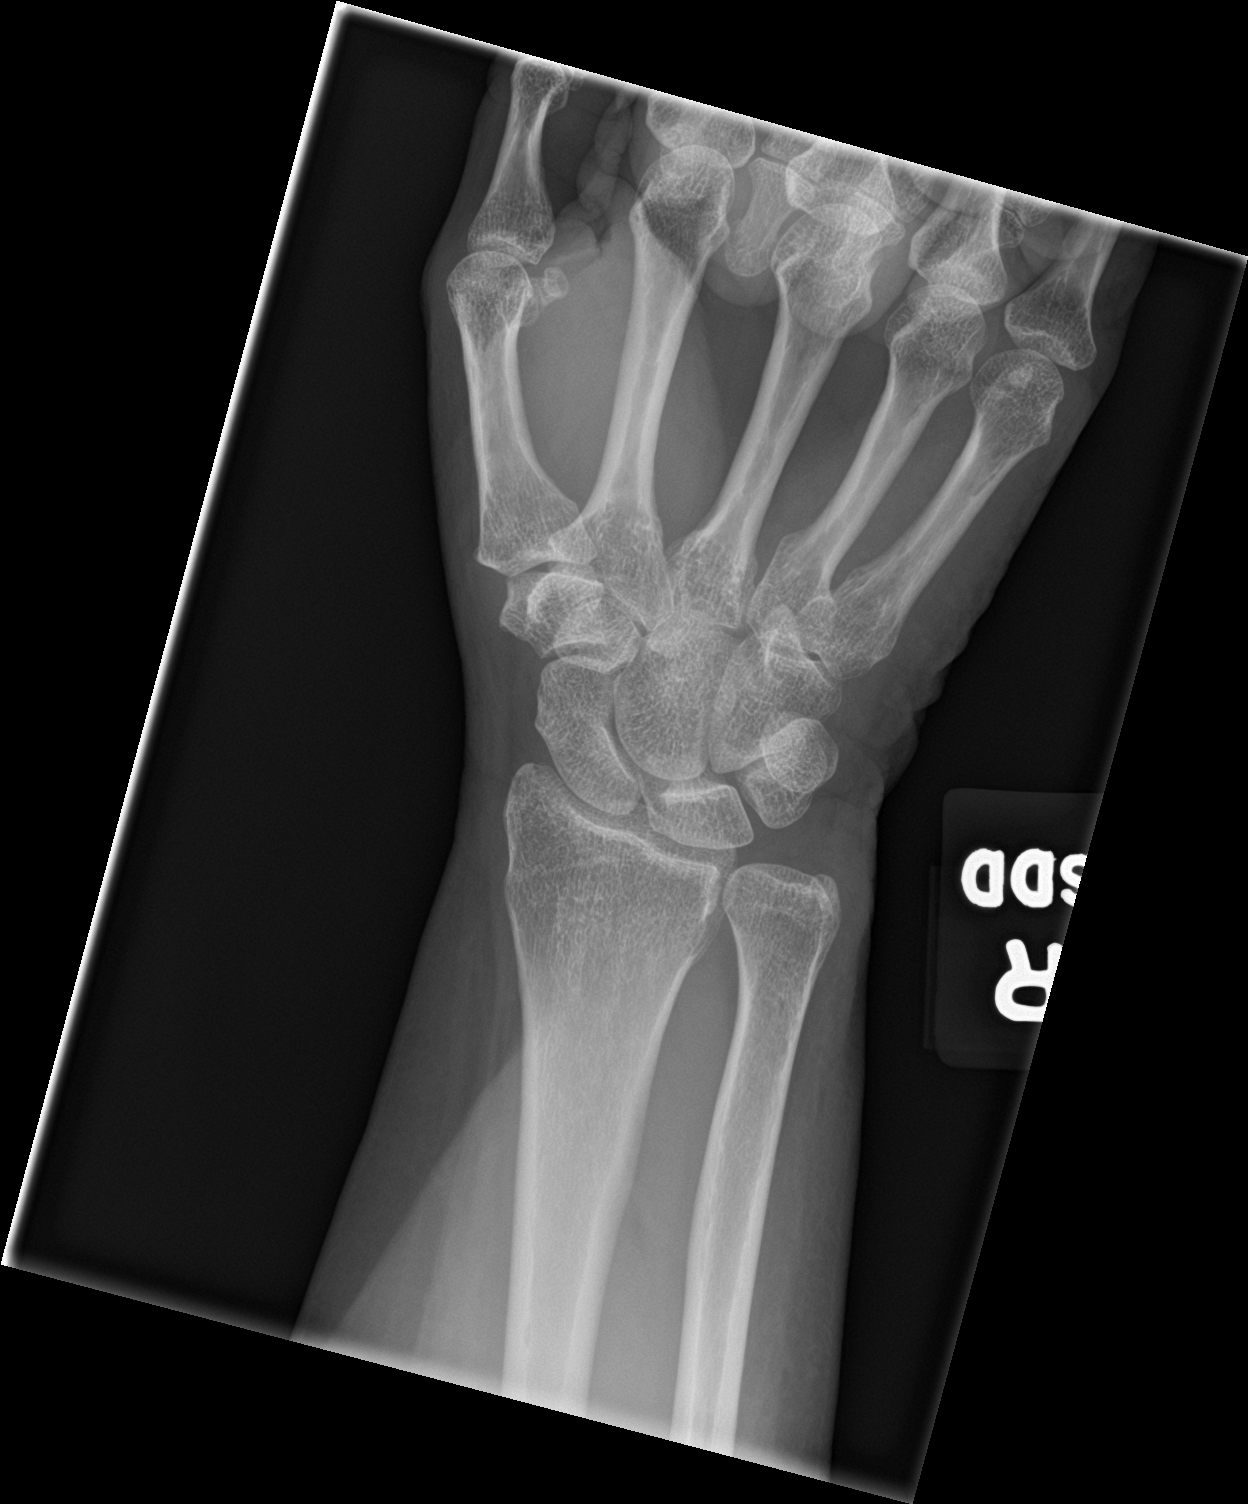

[wrist obl]
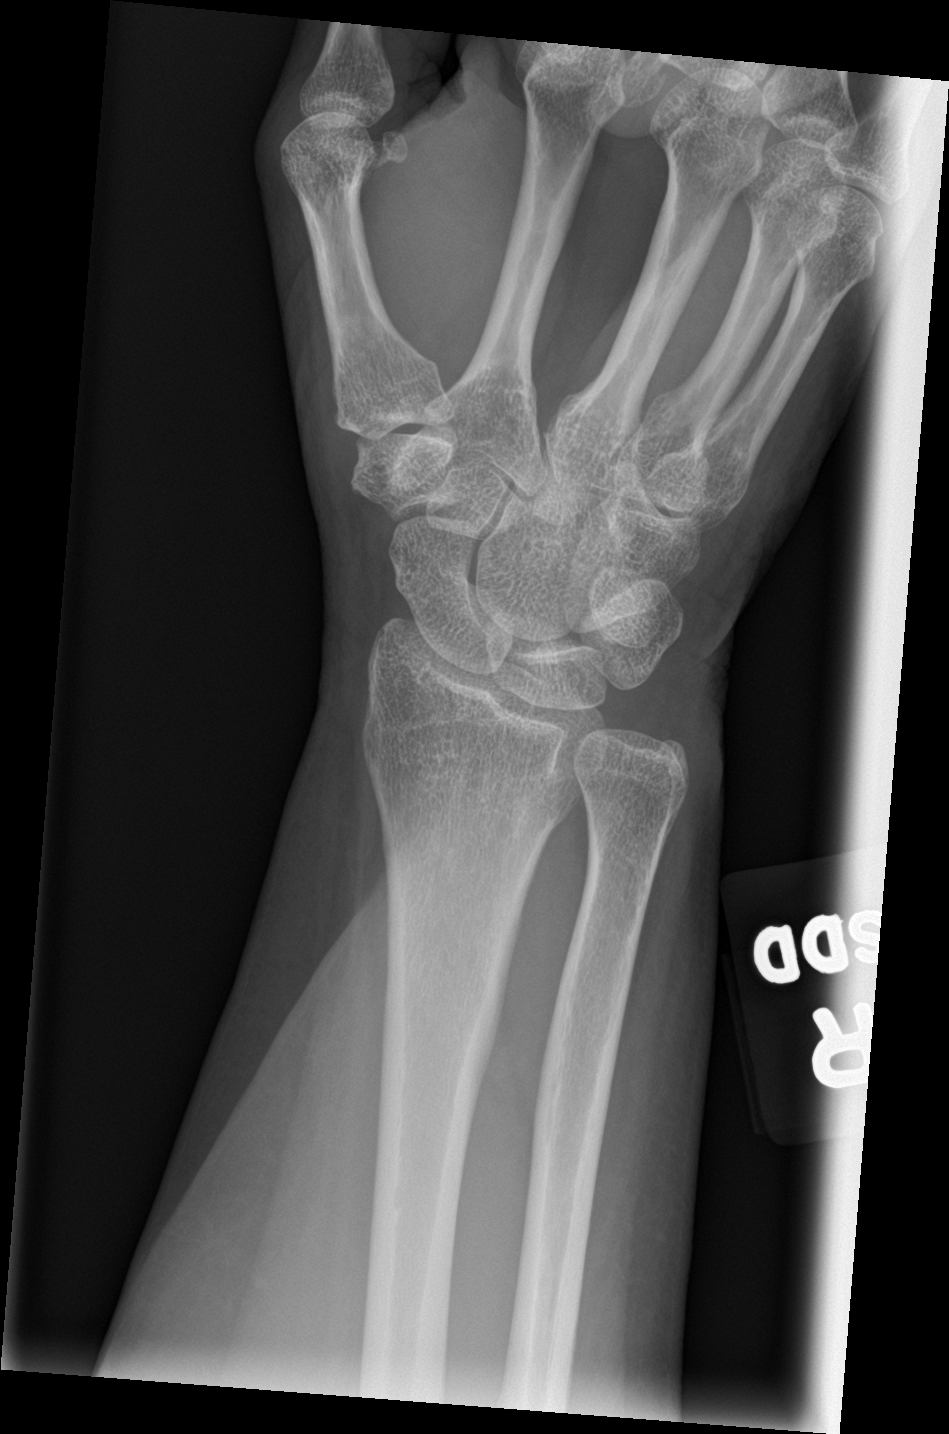

[wrist lat]
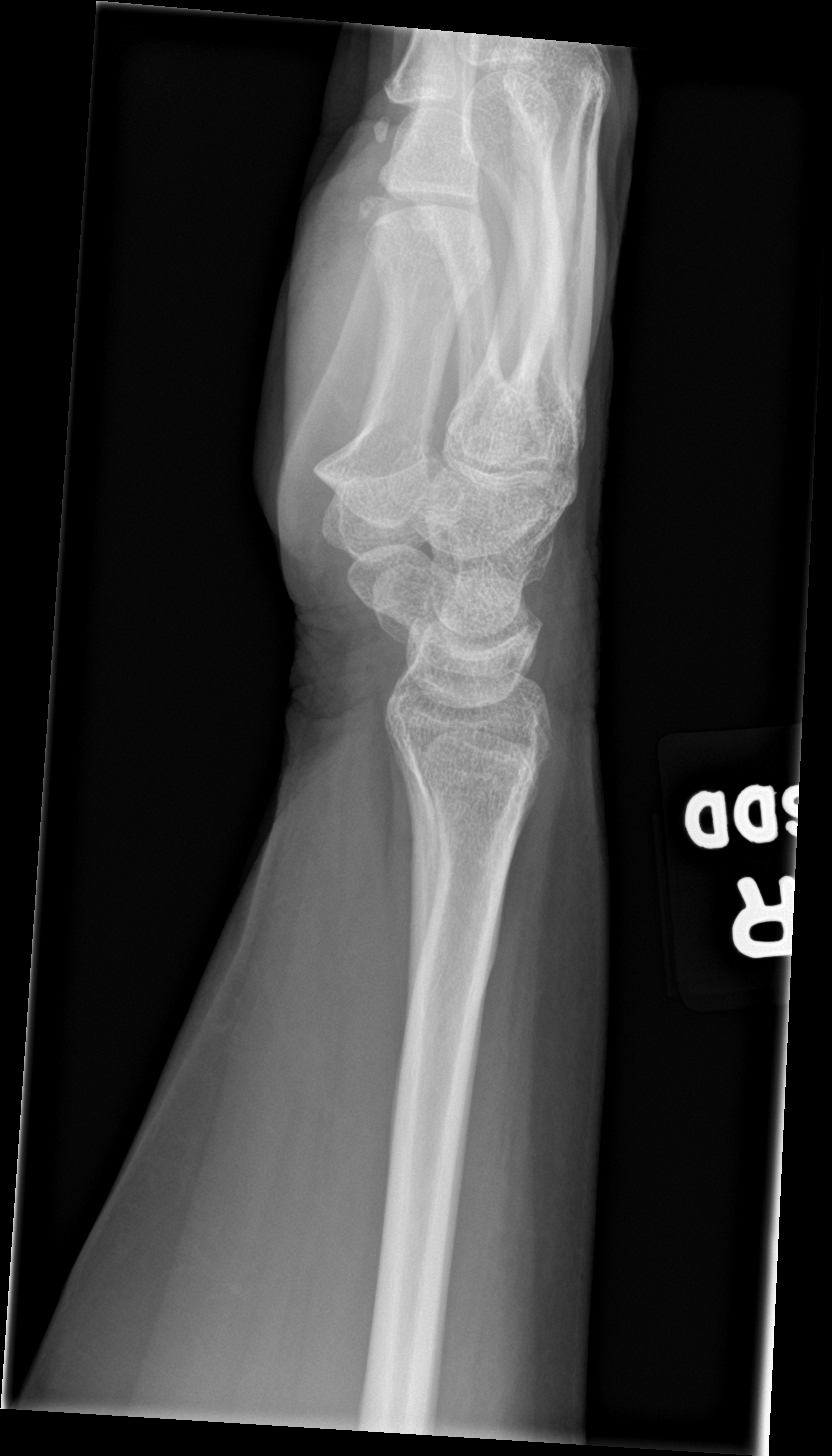

[wrist navicular]
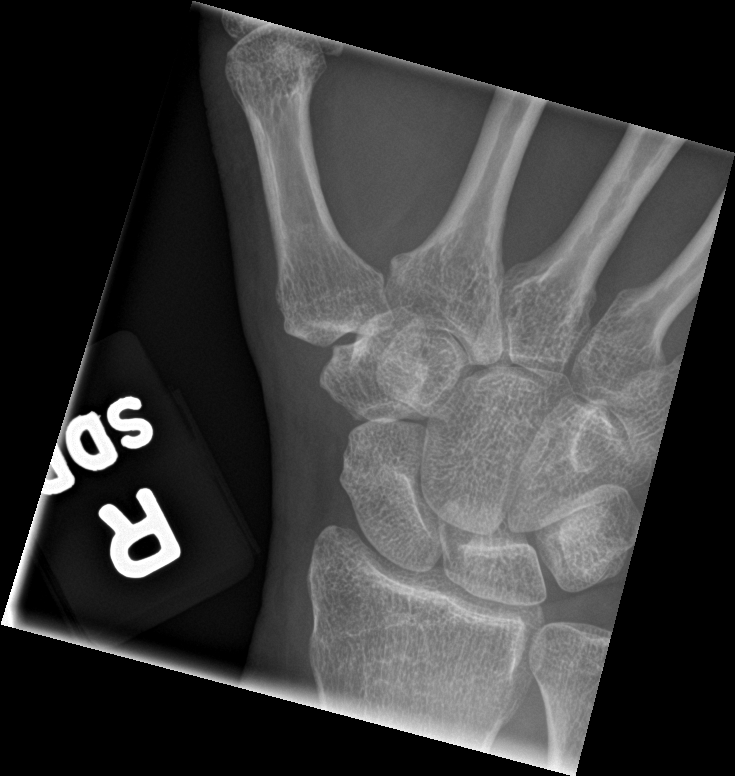

[4 of 4 positions shown; findings below may reference images not displayed]

FINDINGS: There is no evidence of fracture or dislocation. There is no
evidence of arthropathy or other focal bone abnormality. Soft
tissues are unremarkable.
IMPRESSION: Normal examination.

## 2019-02-20 IMAGING — DX DG ANKLE COMPLETE 3+V*R*
3 series · 3 of 3 positions shown · non-contrast
Comparison: 06/13/2015.

CLINICAL DATA: Right ankle pain following a fall at home today.

EXAM:
RIGHT ANKLE - COMPLETE 3+ VIEW

[ankle ap]
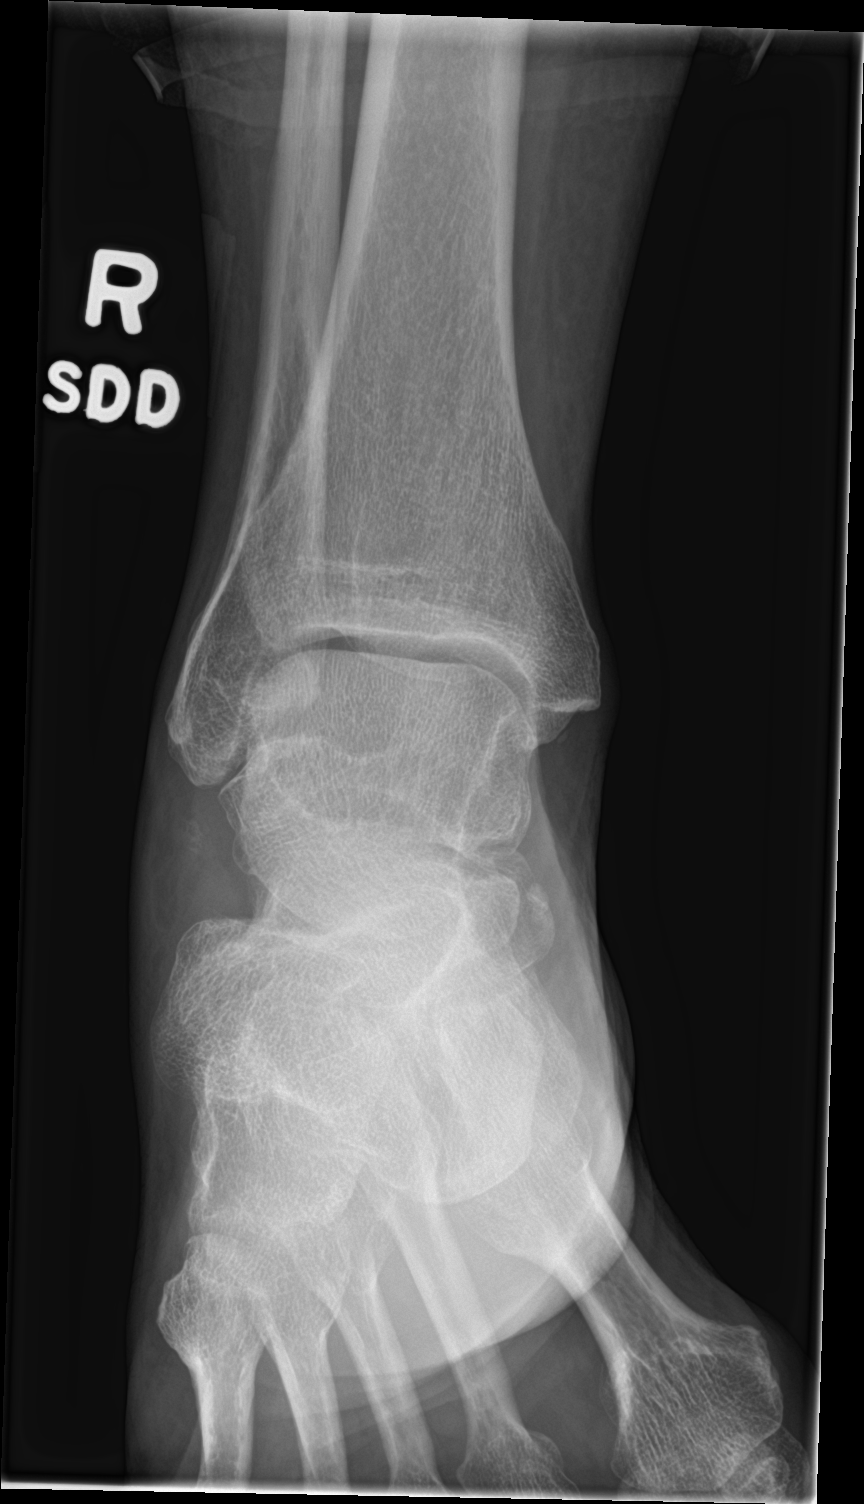

[ankle obl]
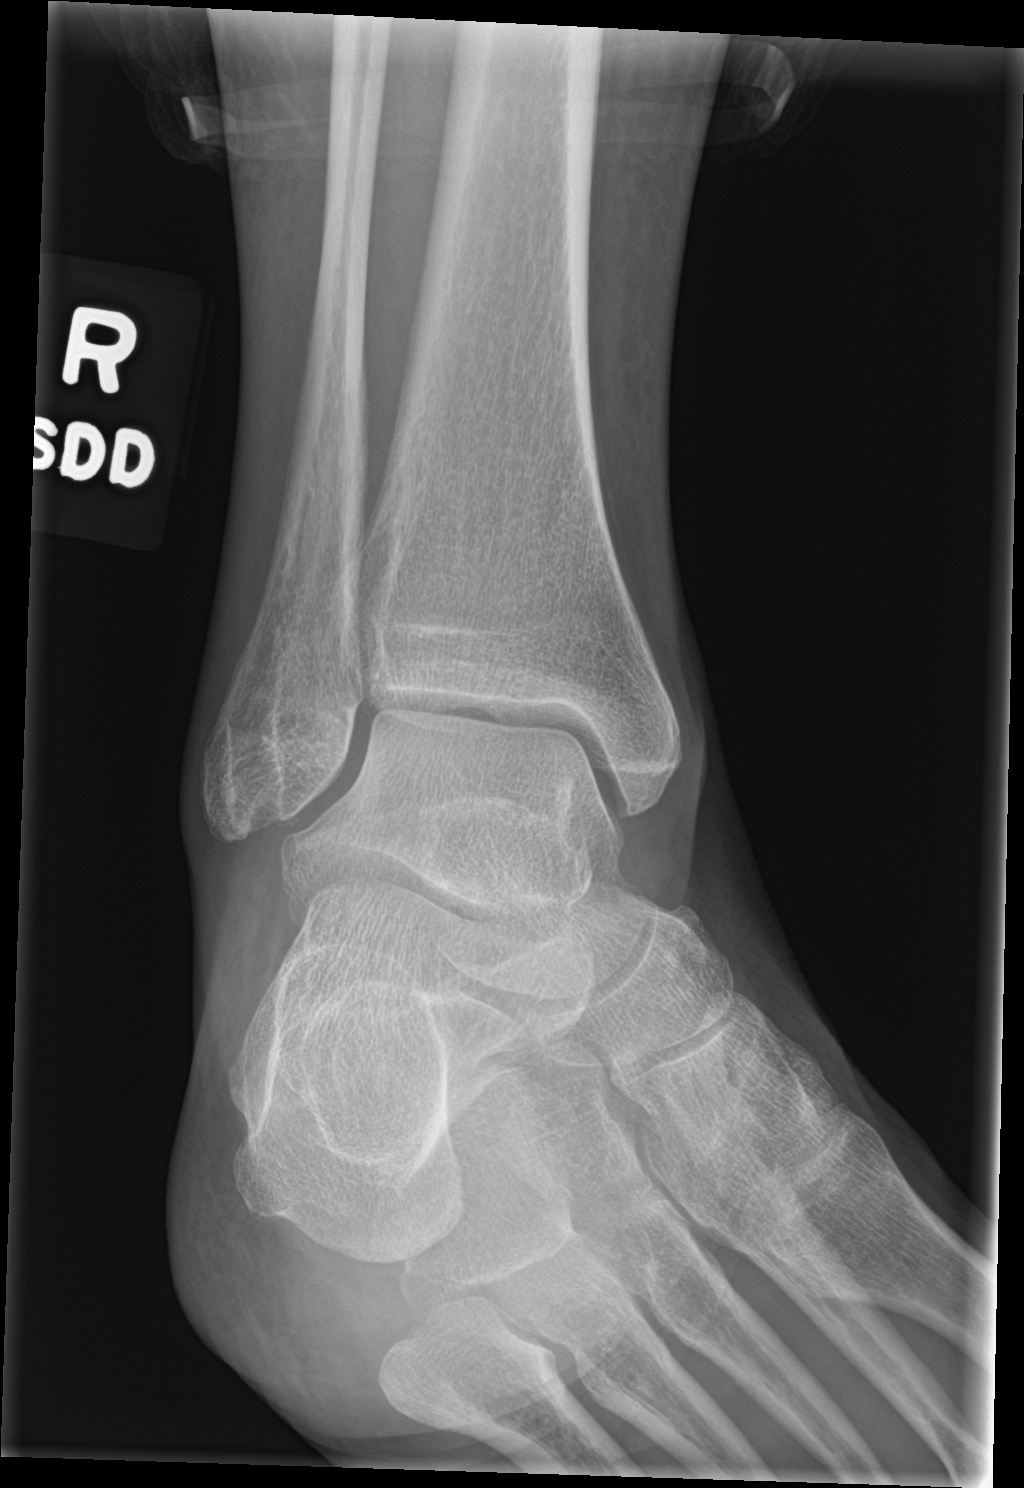

[ankle lat]
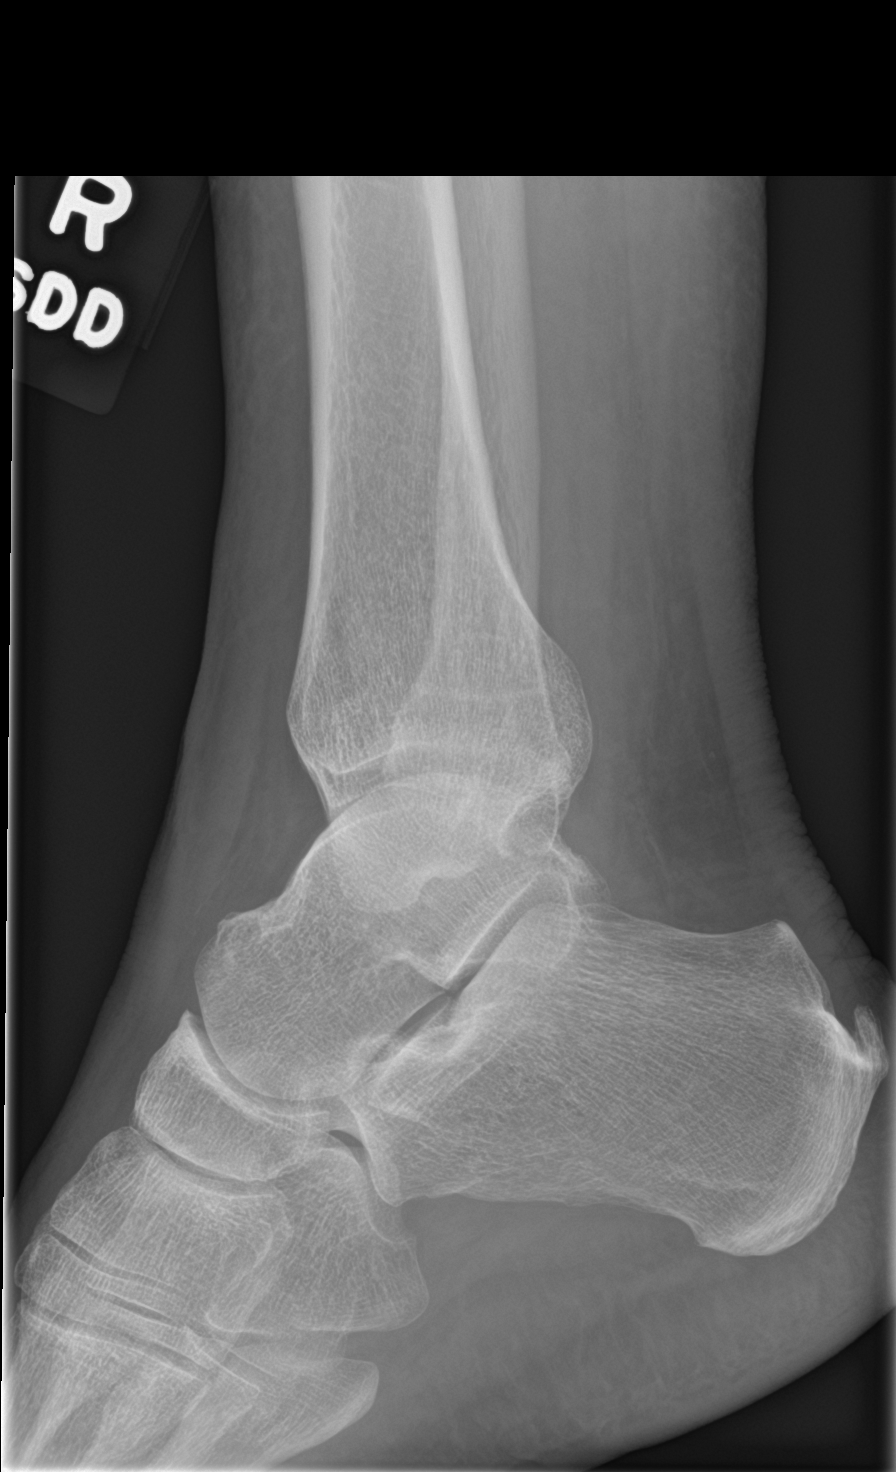

[3 of 3 positions shown; findings below may reference images not displayed]

FINDINGS: Moderately large posterior calcaneal spur. Better defined
curvilinear calcific density distal to the lateral malleolus. No
fracture, dislocation or effusion.
IMPRESSION: No fracture or dislocation.

## 2019-03-23 IMAGING — CT CT CHEST W/O CM
2 of 3 series · 15 of 36 positions shown, 18 images · non-contrast
Comparison: 08/03/2016 chest CT.  05/16/2017 chest radiograph.

CLINICAL DATA: Follow-up pulmonary nodules.

EXAM:
CT CHEST WITHOUT CONTRAST
TECHNIQUE: Multidetector CT imaging of the chest was performed following the
standard protocol without IV contrast.

[Series 2: thorax · axial · 0.66mm/px · z∈[+1853,+2129]mm · 12 of 164 slices shown, 15 images]
[im 13/164  mediastinal]
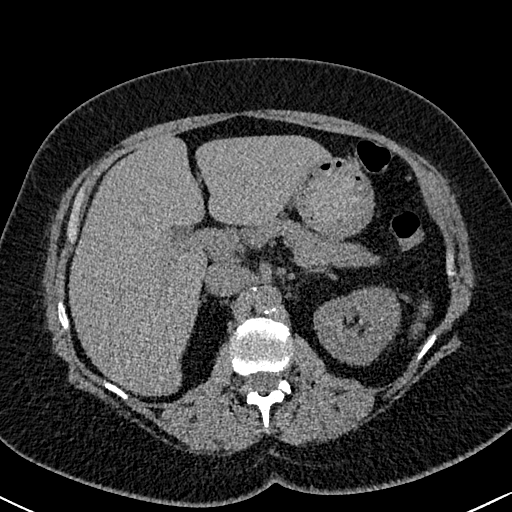
[im 13/164  lung]
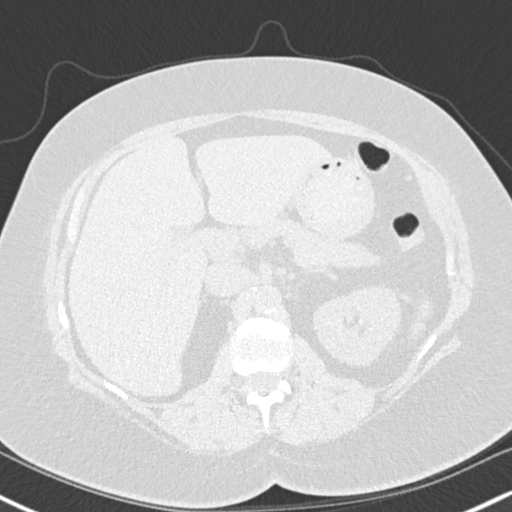
[im 25/164  lung]
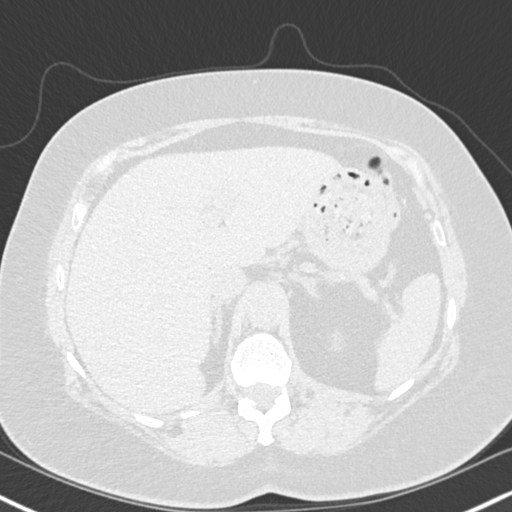
[im 37/164  lung]
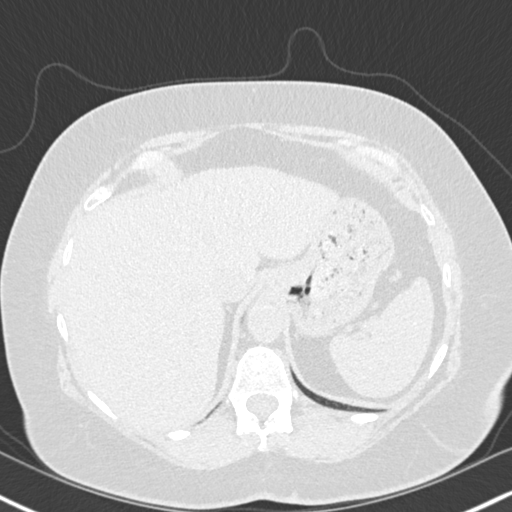
[im 49/164  lung]
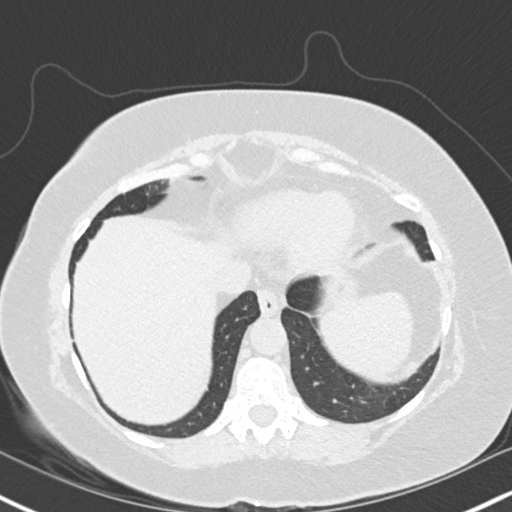
[im 61/164  mediastinal]
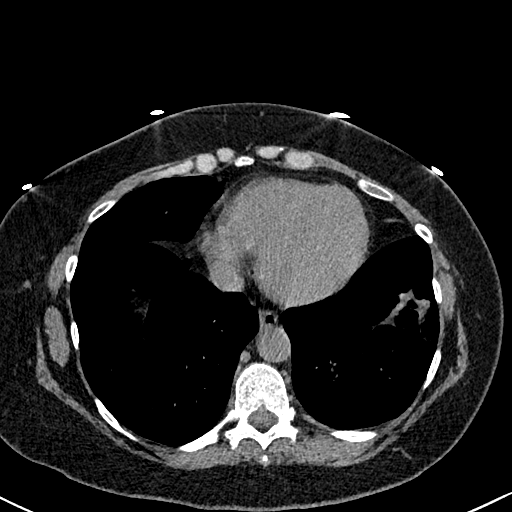
[im 61/164  lung]
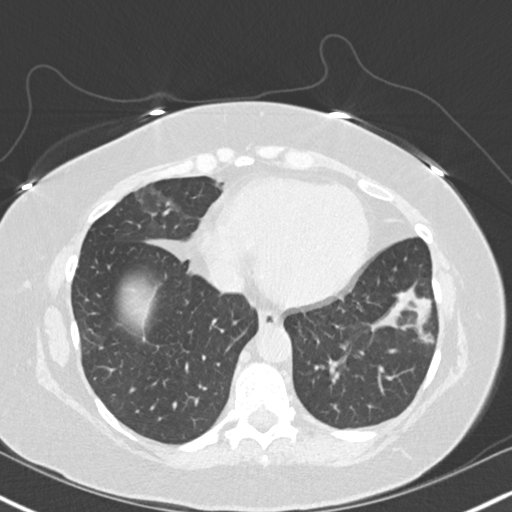
[im 73/164  lung]
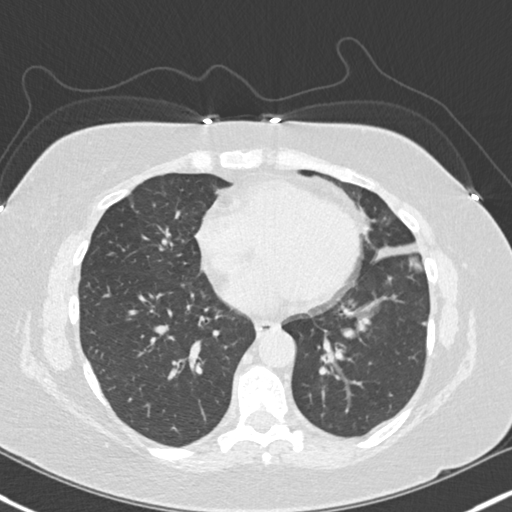
[im 91/164  lung]
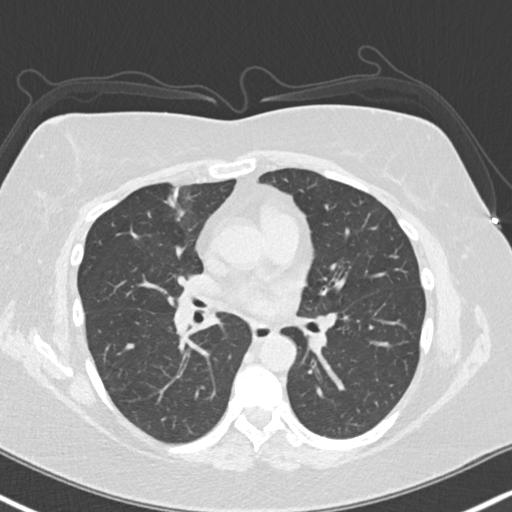
[im 103/164  lung]
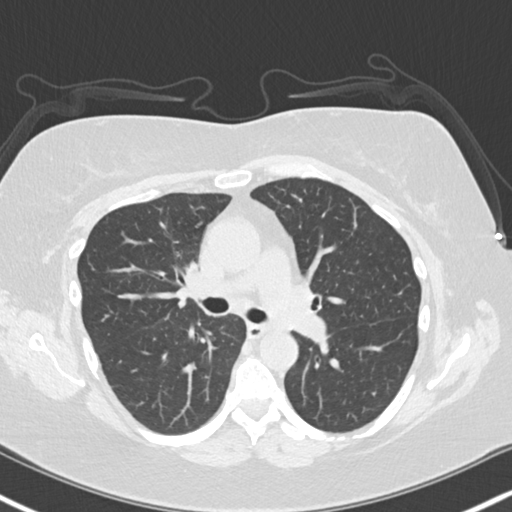
[im 115/164  mediastinal]
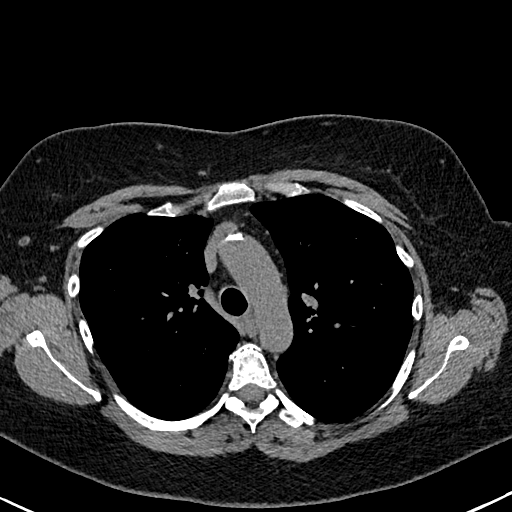
[im 115/164  lung]
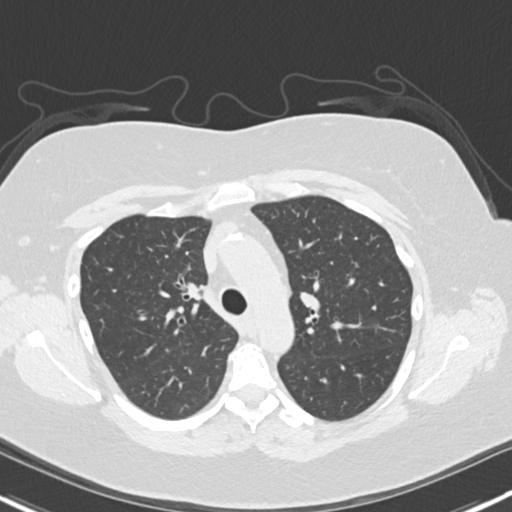
[im 127/164  lung]
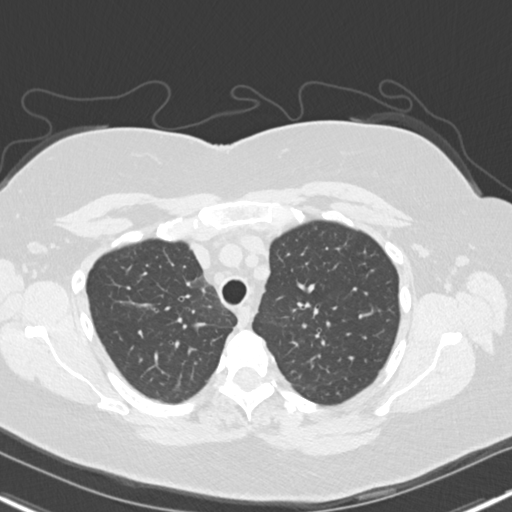
[im 139/164  lung]
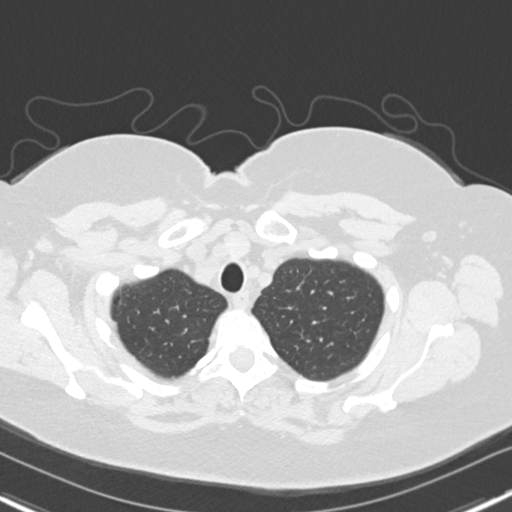
[im 151/164  lung]
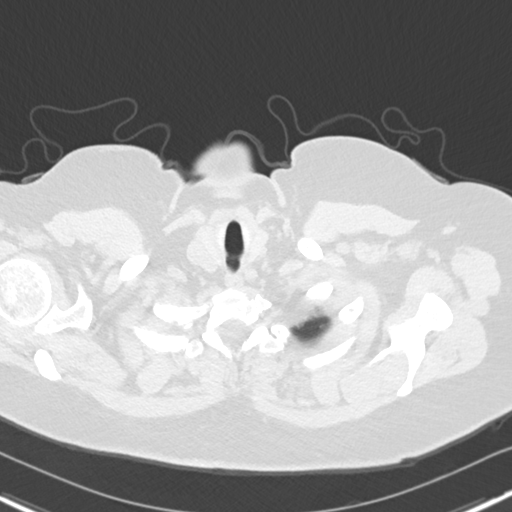

[Series 5: coronal · coronal · 0.65mm/px · 3 of 135 slices shown]
[im 27/135  lung]
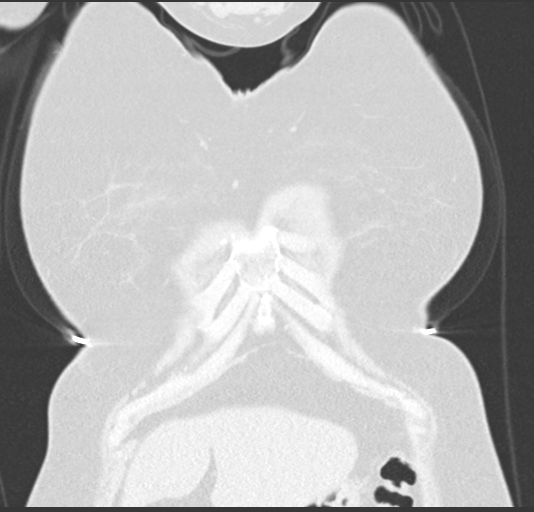
[im 54/135  lung]
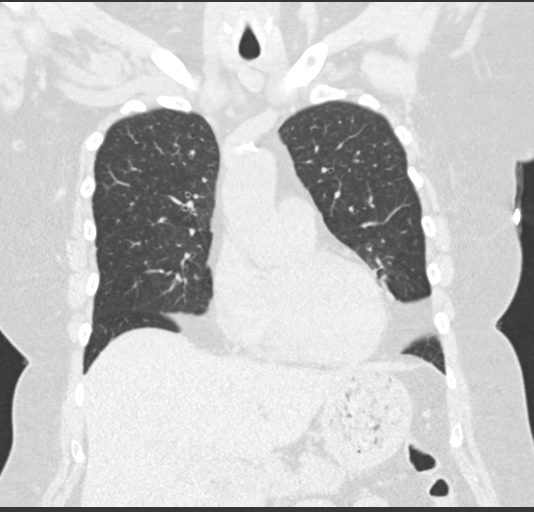
[im 81/135  lung]
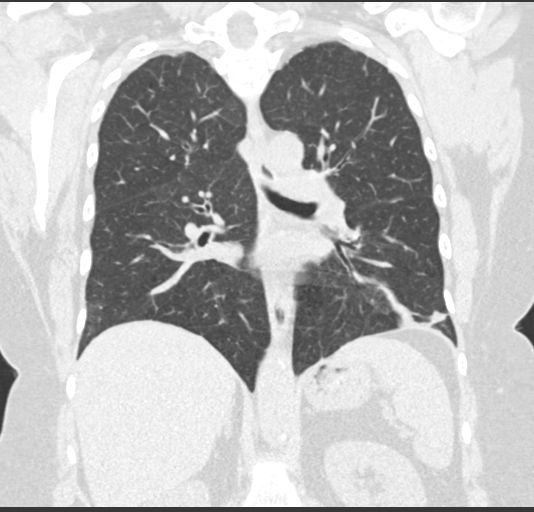

[15 of 36 positions shown; findings below may reference images not displayed]

FINDINGS: Cardiovascular: Normal heart size. No significant pericardial
fluid/thickening. Atherosclerotic nonaneurysmal thoracic aorta.
Normal caliber pulmonary arteries.

Mediastinum/Nodes: Stable 1.7 cm exophytic solid inferior right
thyroid nodule. Unremarkable esophagus. No pathologically enlarged
axillary, mediastinal or gross hilar lymph nodes, noting limited
sensitivity for the detection of hilar adenopathy on this
noncontrast study.

Lungs/Pleura: No pneumothorax. No pleural effusion. Diffuse
bronchial wall thickening. Mildly thickened parenchymal bands in
basilar right upper lobe and left lower lobe are new and compatible
with evolving postinfectious/postinflammatory scarring. Stable
parenchymal bands in right middle lobe and lingula compatible with
postinfectious/postinflammatory scarring. No lung masses. A few
scattered ground-glass pulmonary nodules in the right upper lobe,
largest 8 mm (series 4/image 34), all unchanged. A few scattered
solid pulmonary nodules in both lungs, largest 5 mm in the right
lower lobe (series 4/image 97), all unchanged. No new significant
pulmonary nodules.

Upper abdomen: Cholecystectomy.

Musculoskeletal: No aggressive appearing focal osseous lesions. Mild
thoracic spondylosis.
IMPRESSION: 1. Scattered ground-glass pulmonary nodules, largest 8 mm in the
right upper lobe, all stable for 11 months. Follow-up noncontrast
chest CT is recommended every 2 years until 5 years of stability has
been established. This recommendation follows the consensus
statement: Guidelines for Management of Incidental Pulmonary Nodules
Detected on CT Images: From the [HOSPITAL] 8618; Radiology
8618; [DATE].
2. Scattered small solid pulmonary nodules, largest 5 mm, all
stable, considered benign.
3. Scattered postinfectious/postinflammatory scarring in both lungs,
some new/evolving in the interval.
4. Diffuse bronchial wall thickening, suggesting chronic bronchitis.

Aortic Atherosclerosis ([id number]).

## 2019-04-24 IMAGING — CR DG CHEST 2V
2 series · 2 of 2 positions shown · non-contrast
Comparison: Chest x-ray dated May 16, 2017.

CLINICAL DATA: Left-sided chest pain.

EXAM:
CHEST - 2 VIEW

[w chest pa]
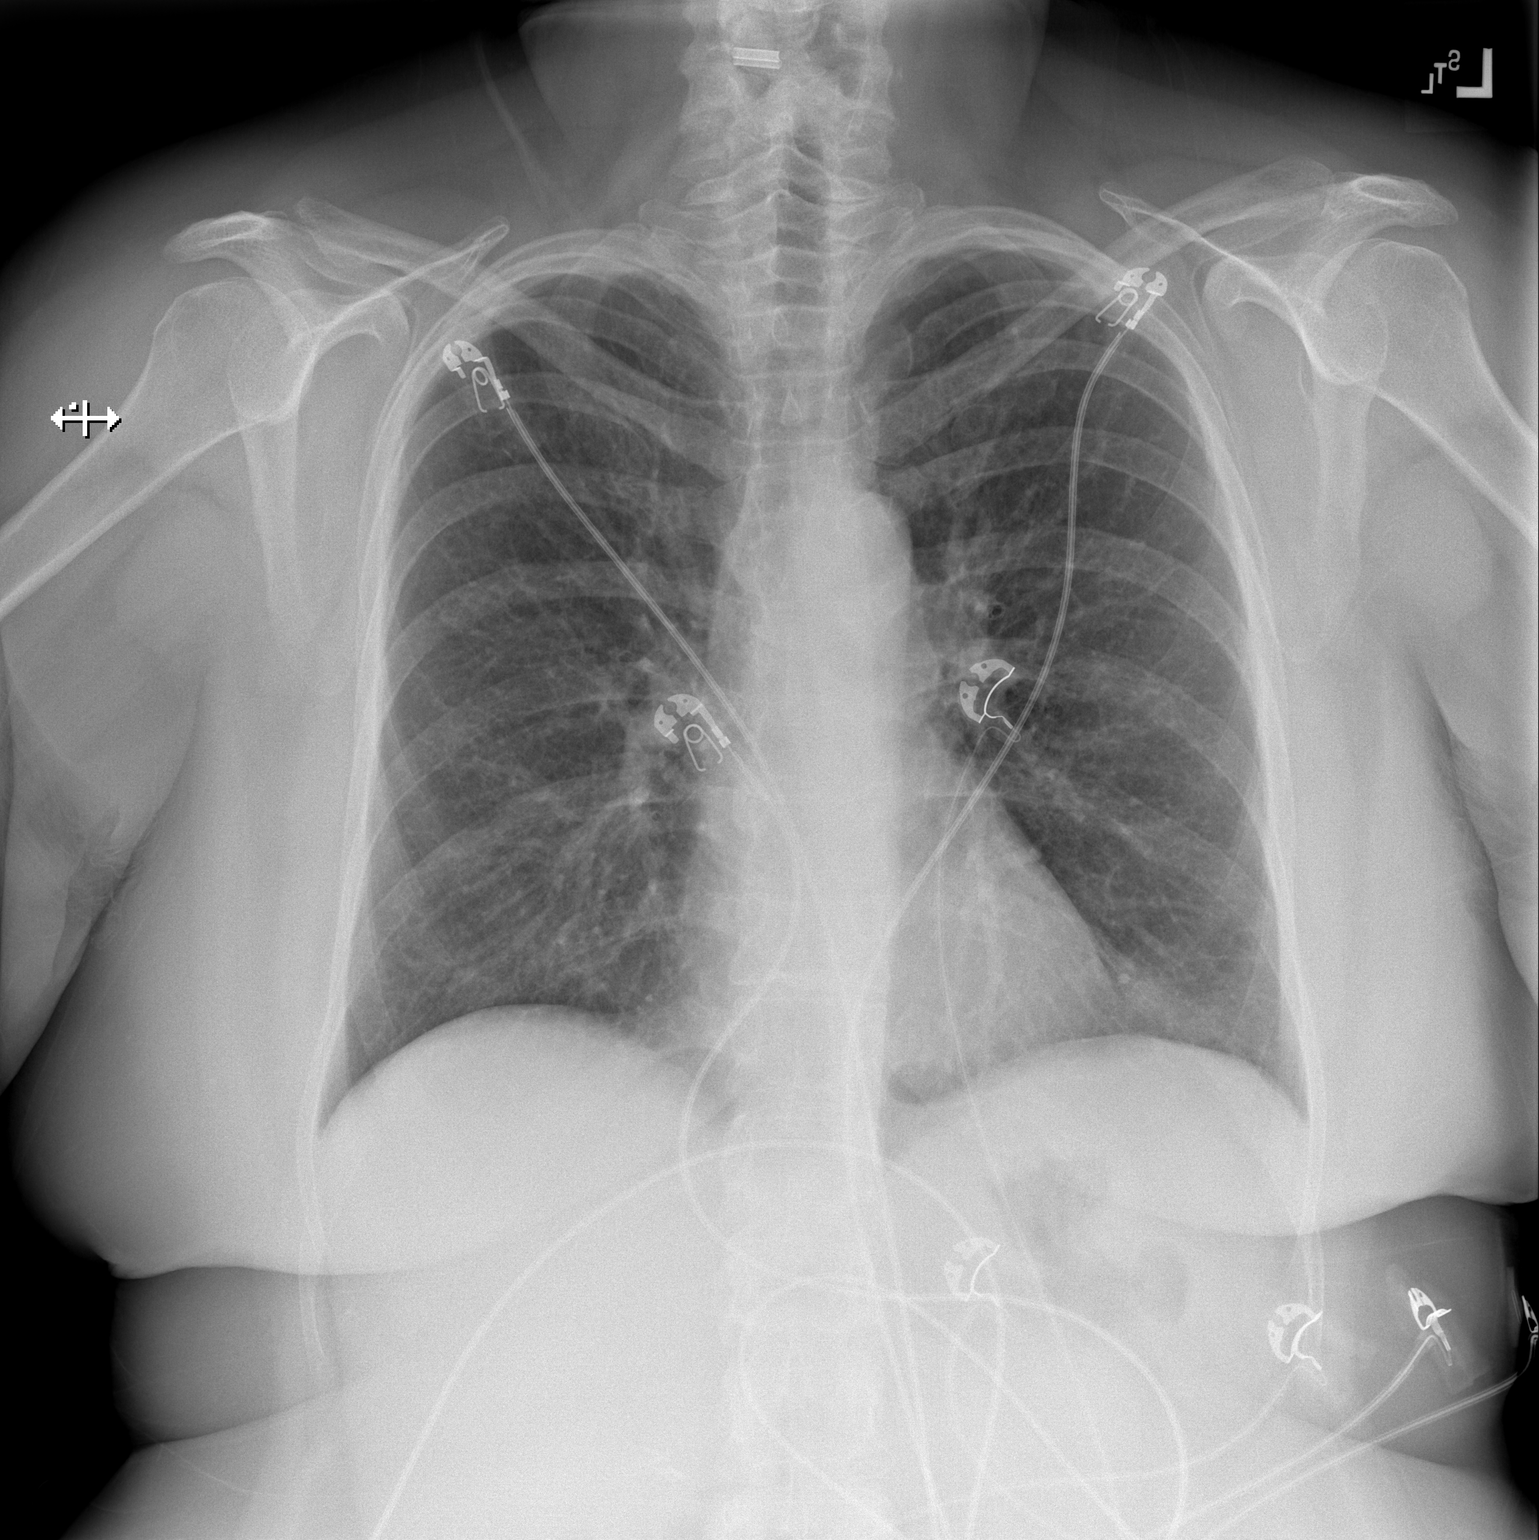

[w chest lat]
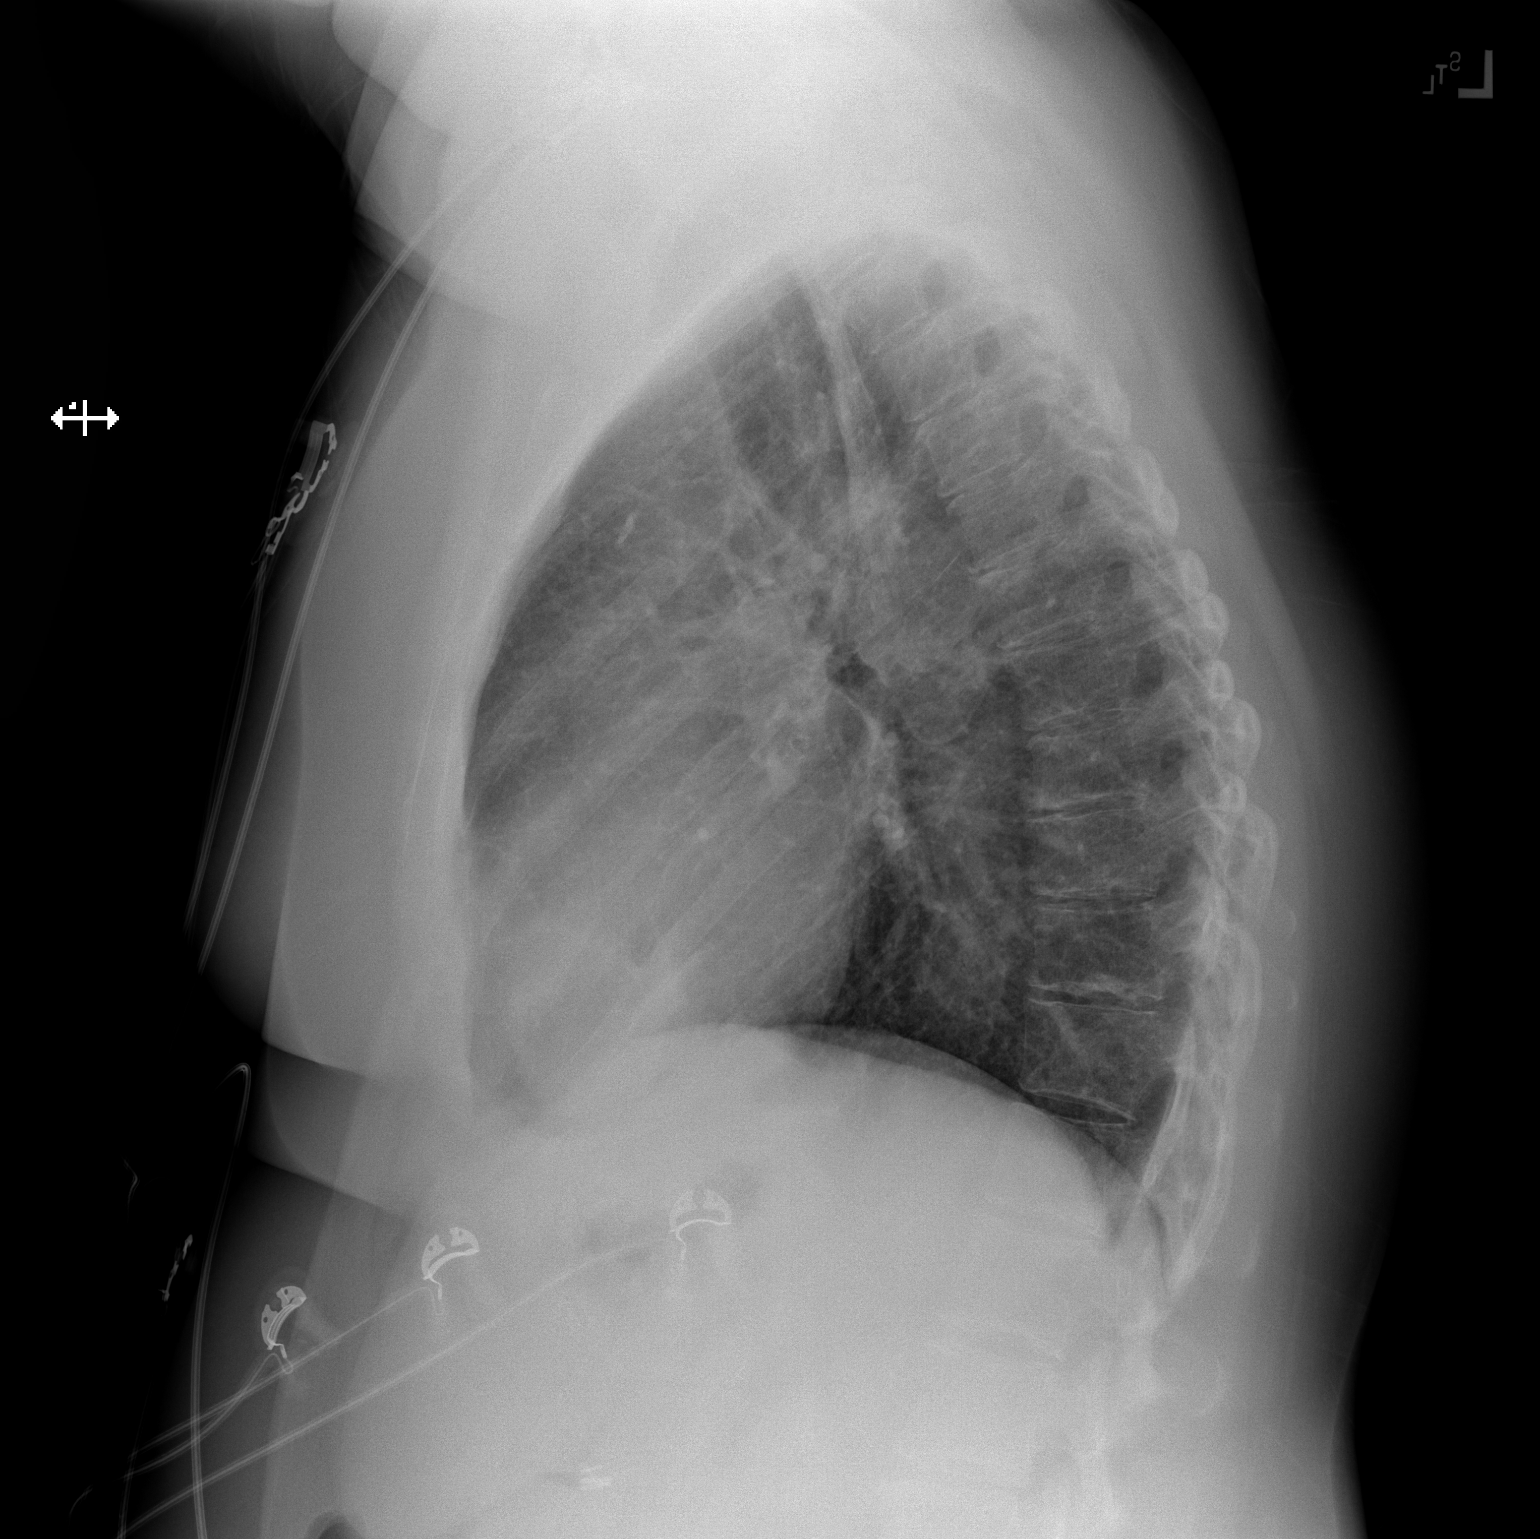

[2 of 2 positions shown; findings below may reference images not displayed]

FINDINGS: The heart size and mediastinal contours are within normal limits.
Both lungs are clear. The visualized skeletal structures are
unremarkable.
IMPRESSION: No active cardiopulmonary disease.

## 2019-04-24 IMAGING — CT CT HEAD W/O CM
3 series · 14 of 47 positions shown, 16 images · non-contrast
Comparison: MR head 05/18/2017.  CT head 05/19/2017.

CLINICAL DATA: Hypertensive patient with headache.

EXAM:
CT HEAD WITHOUT CONTRAST
TECHNIQUE: Contiguous axial images were obtained from the base of the skull
through the vertex without intravenous contrast.

[Series 2: head wo · axial · 0.47mm/px · z∈[-161,-36]mm · 8 of 31 slices shown, 10 images]
[im 3/31  brain]
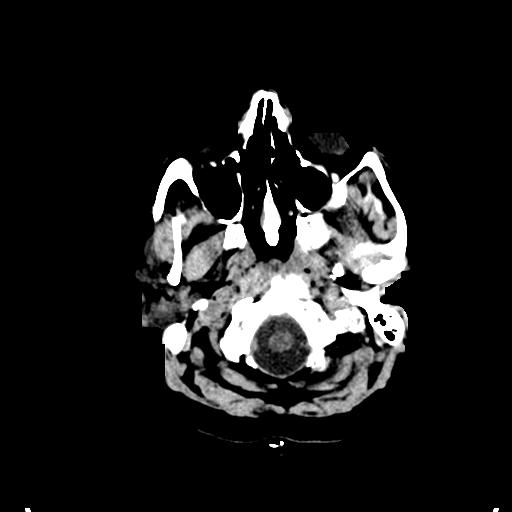
[im 3/31  bone]
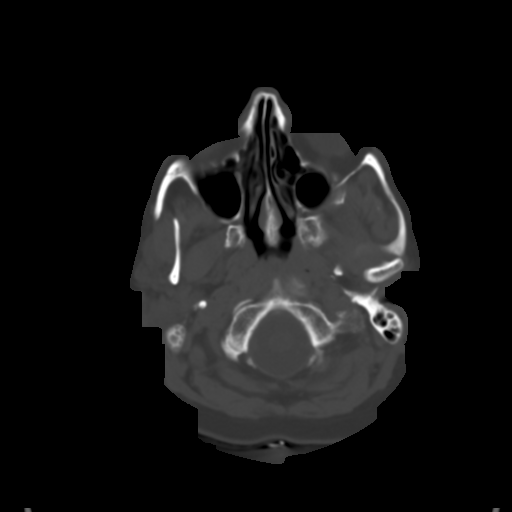
[im 7/31  brain]
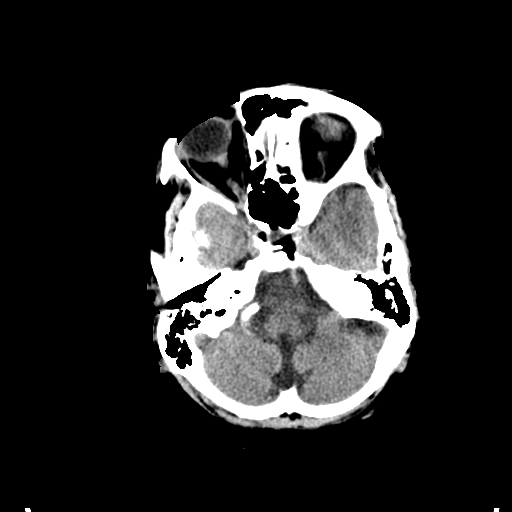
[im 10/31  brain]
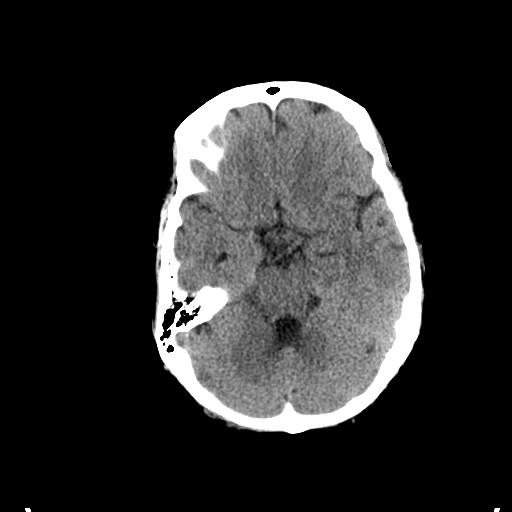
[im 14/31  brain]
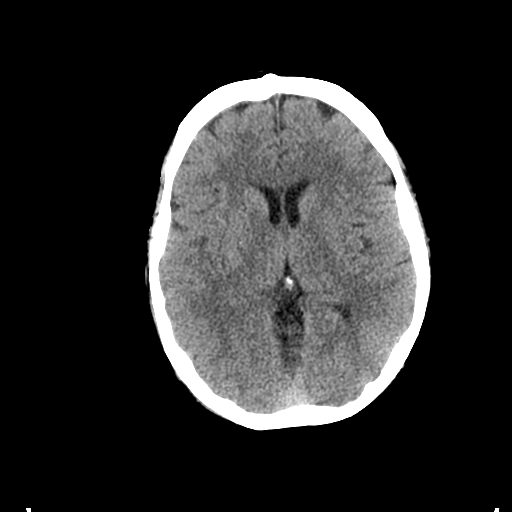
[im 17/31  brain]
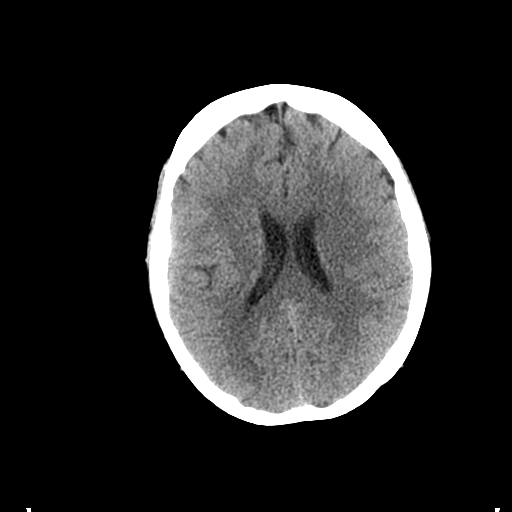
[im 17/31  bone]
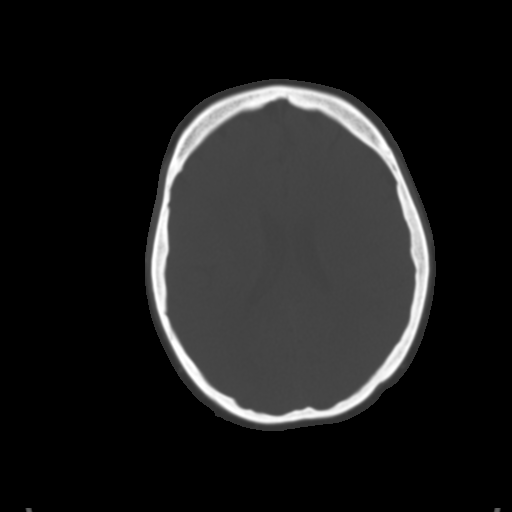
[im 21/31  brain]
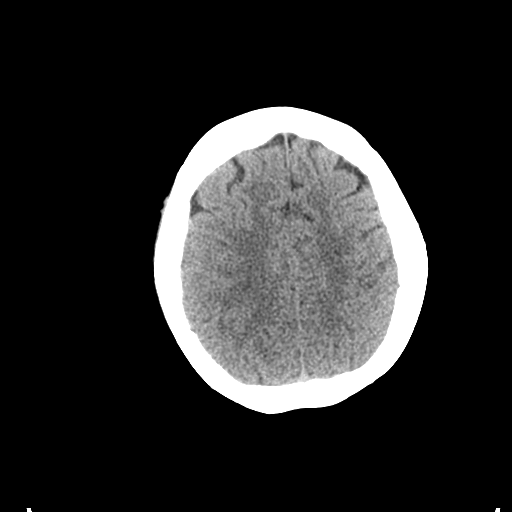
[im 24/31  brain]
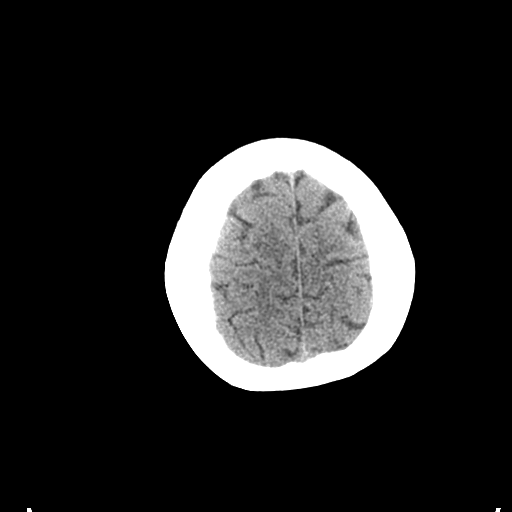
[im 28/31  brain]
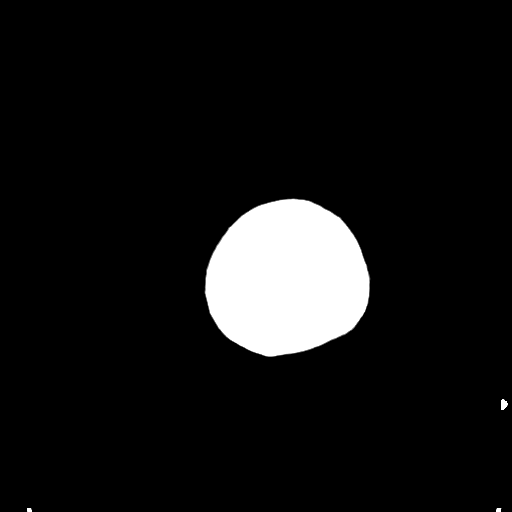

[Series 4: coronal soft tissue · coronal · 0.30mm/px · 3 of 83 slices shown]
[im 28/83  brain]
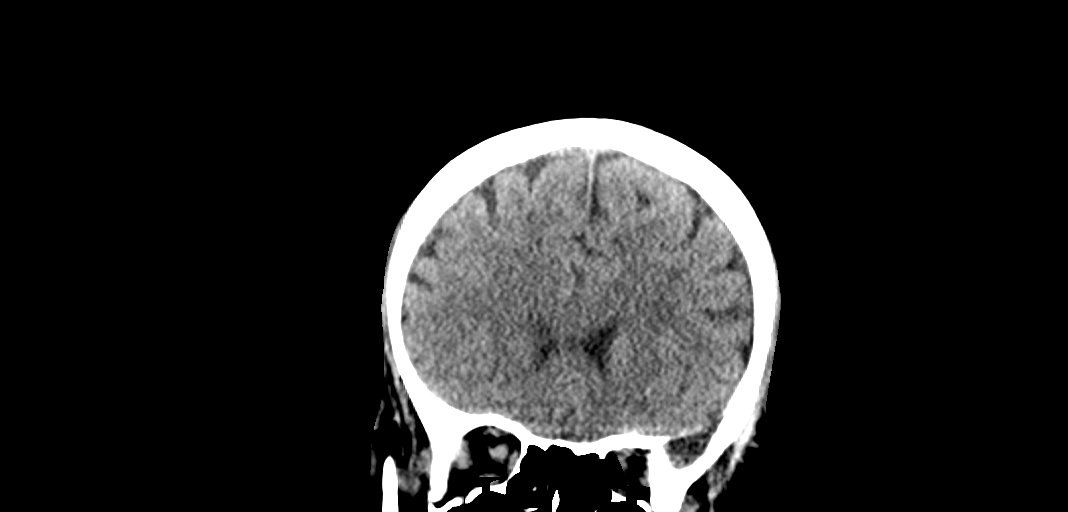
[im 37/83  brain]
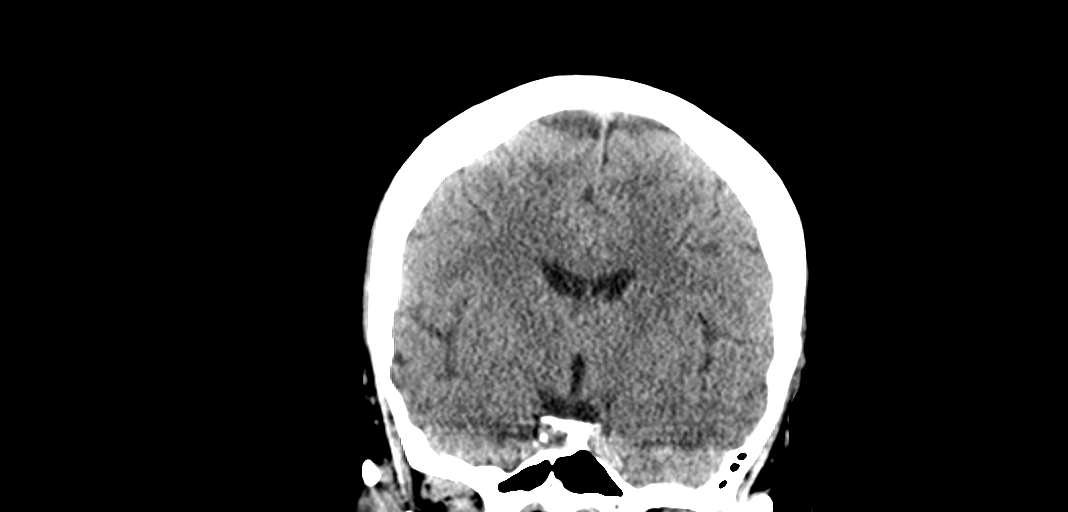
[im 46/83  brain]
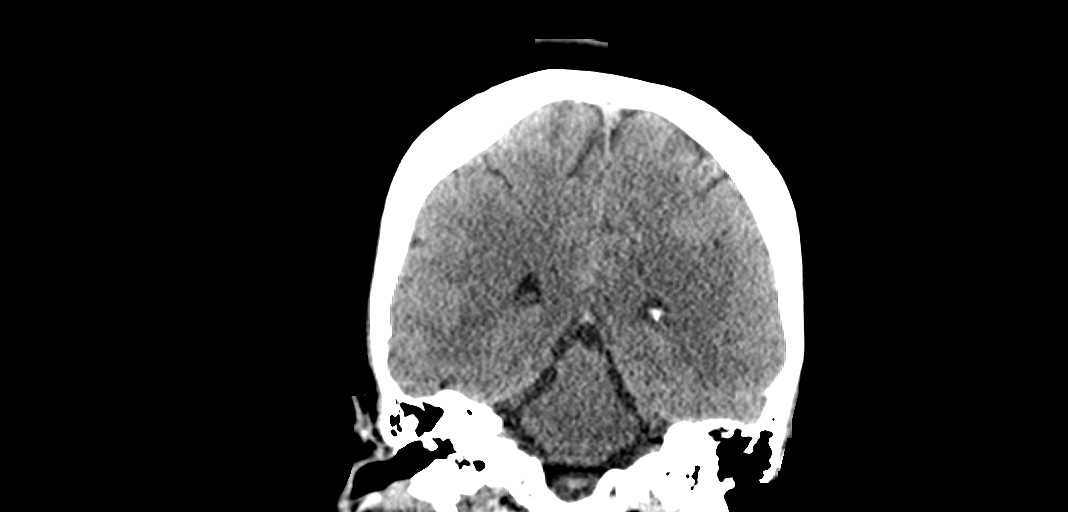

[Series 5: sagittal soft tissue · sagittal · 0.32mm/px · 3 of 69 slices shown]
[im 23/69  brain]
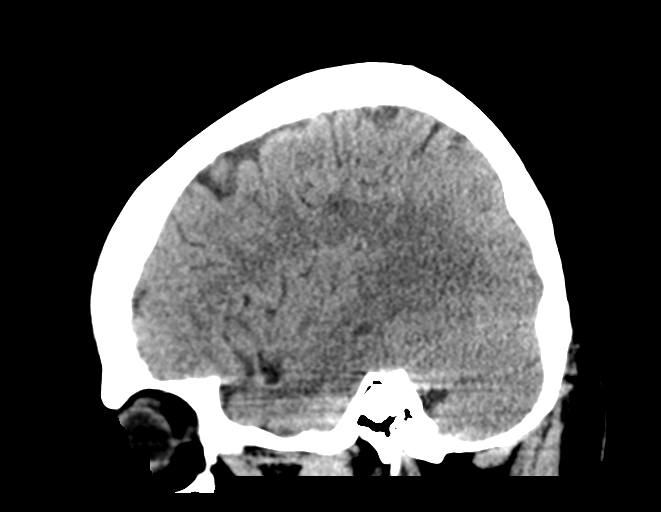
[im 35/69  brain]
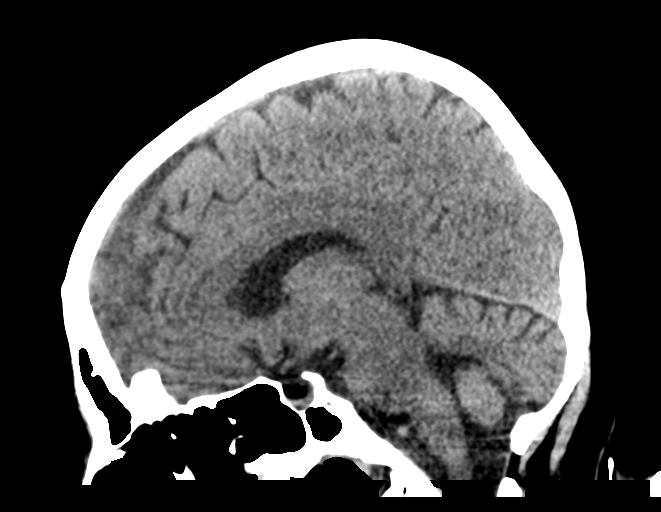
[im 46/69  brain]
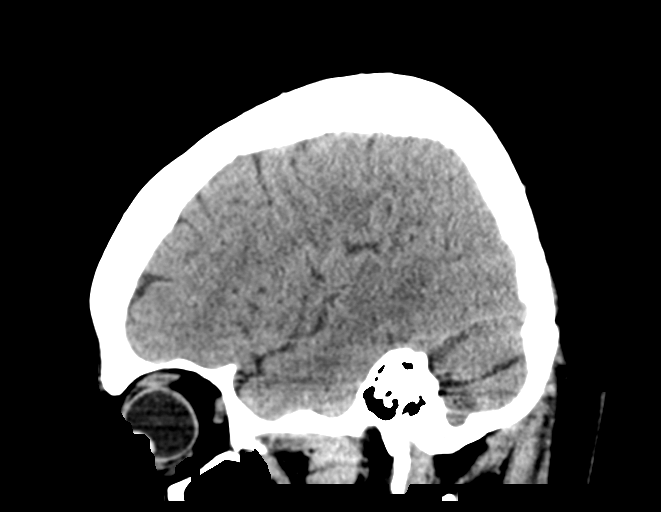

[14 of 47 positions shown; findings below may reference images not displayed]

FINDINGS: Brain: No evidence for acute infarction, hemorrhage, mass lesion,
hydrocephalus, or extra-axial fluid. Normal cerebral volume.
Subcortical BILATERAL supratentorial white matter signal
abnormality, appears similar to prior MR, consistent with chronic
microvascular ischemic change.

Vascular: No hyperdense vessel or unexpected calcification.

Skull: Normal. Negative for fracture or focal lesion.

Sinuses/Orbits: No acute finding.

Other: None.
IMPRESSION: Chronic changes as described. No definite acute intracranial
findings. See discussion above.

If strong concern for PRES/hypertensive encephalopathy, MR is more
sensitive.

## 2019-04-24 IMAGING — CT CT ABD-PELV W/O CM
2 of 4 series · 17 of 46 positions shown, 19 images · non-contrast
Comparison: 10/14/2016 CT abdomen and pelvis

CLINICAL DATA: 56 y/o F; nausea, vomiting, and diarrhea since
yesterday. Epigastric abdominal pain.

EXAM:
CT ABDOMEN AND PELVIS WITHOUT CONTRAST
TECHNIQUE: Multidetector CT imaging of the abdomen and pelvis was performed
following the standard protocol without IV contrast.

[Series 2: axial st · axial · 0.83mm/px · z∈[-324,+76]mm · 14 of 92 slices shown, 16 images]
[im 6/92  soft-tissue]
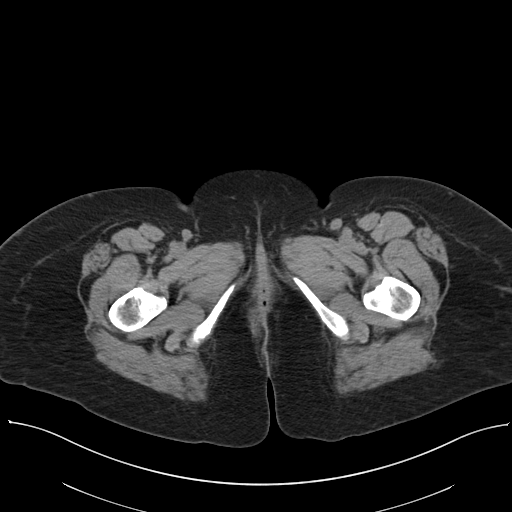
[im 6/92  bone]
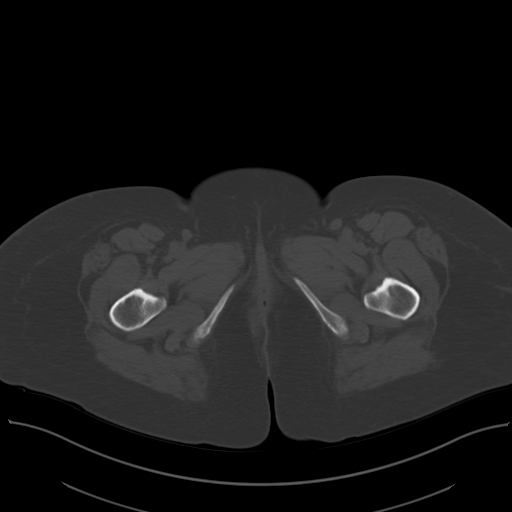
[im 11/92  soft-tissue]
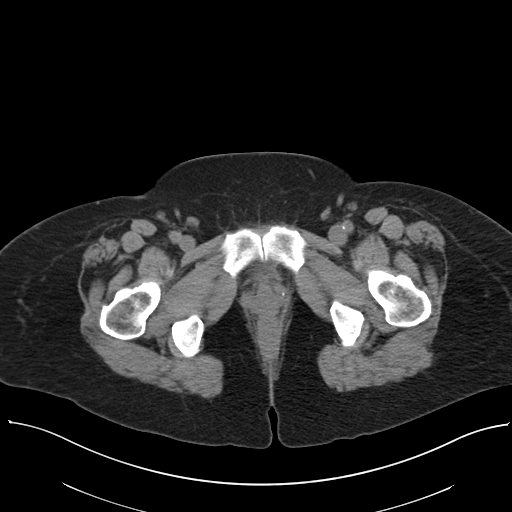
[im 17/92  soft-tissue]
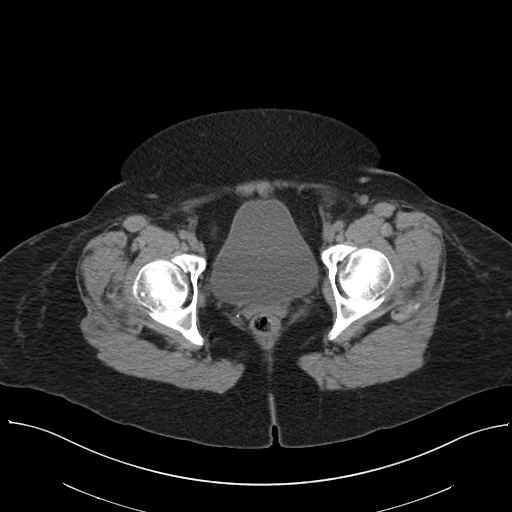
[im 27/92  soft-tissue]
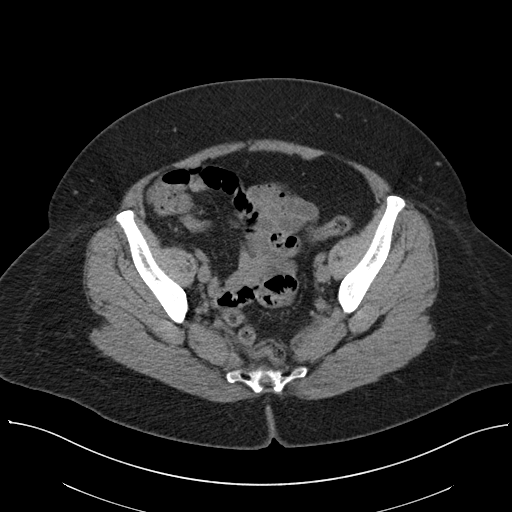
[im 33/92  soft-tissue]
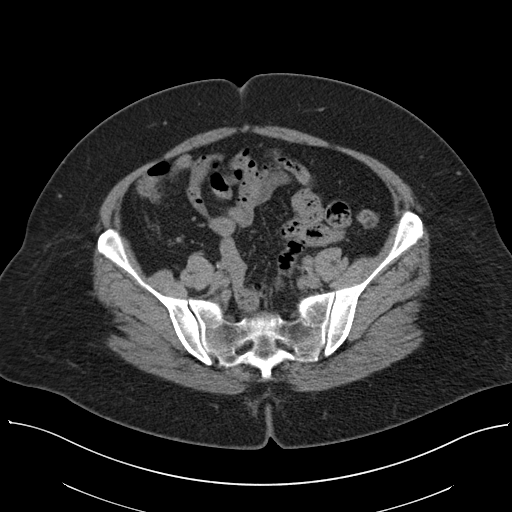
[im 38/92  soft-tissue]
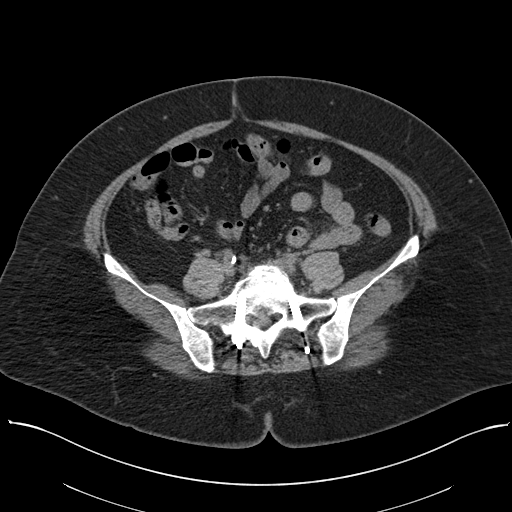
[im 43/92  soft-tissue]
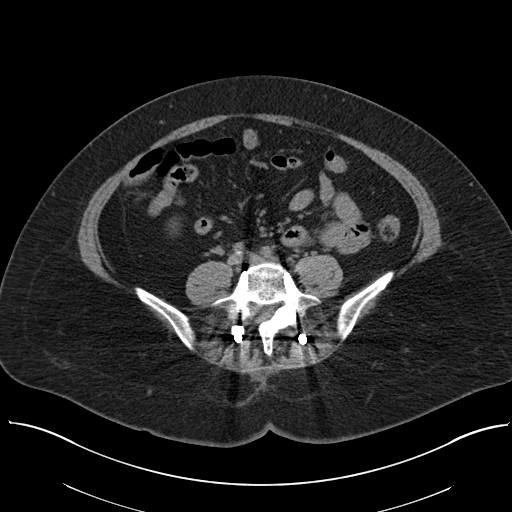
[im 49/92  soft-tissue]
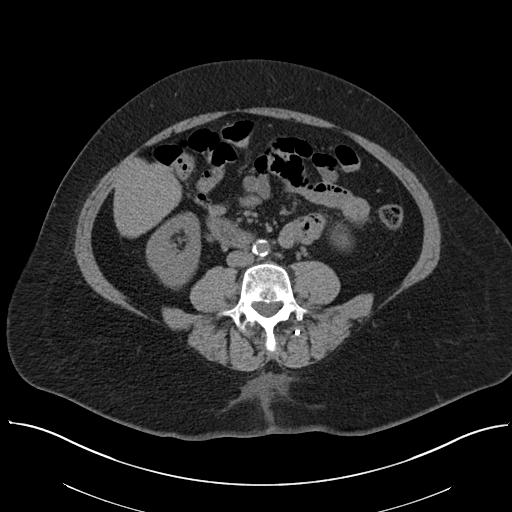
[im 54/92  soft-tissue]
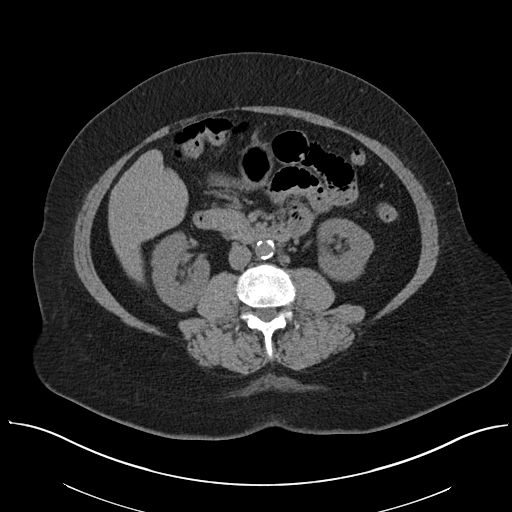
[im 54/92  bone]
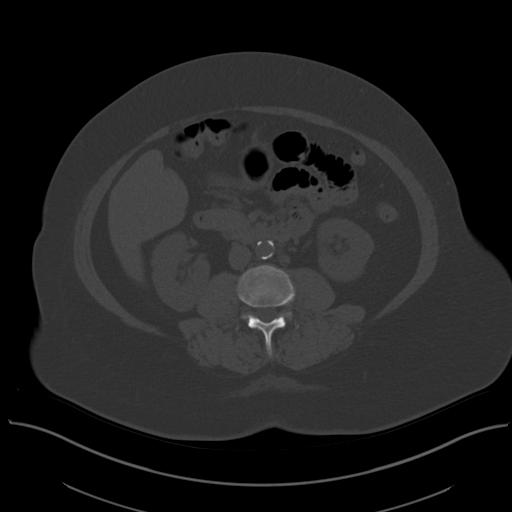
[im 59/92  soft-tissue]
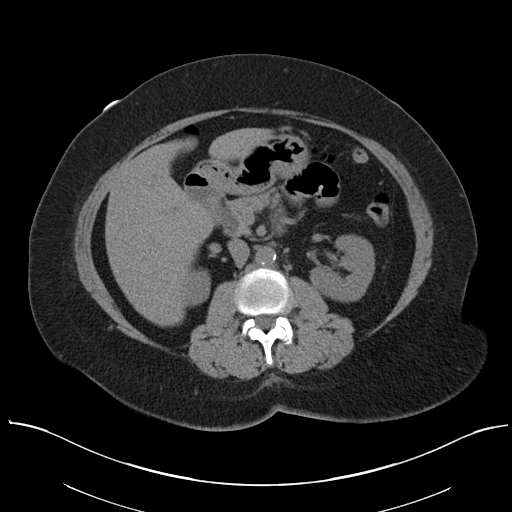
[im 70/92  soft-tissue]
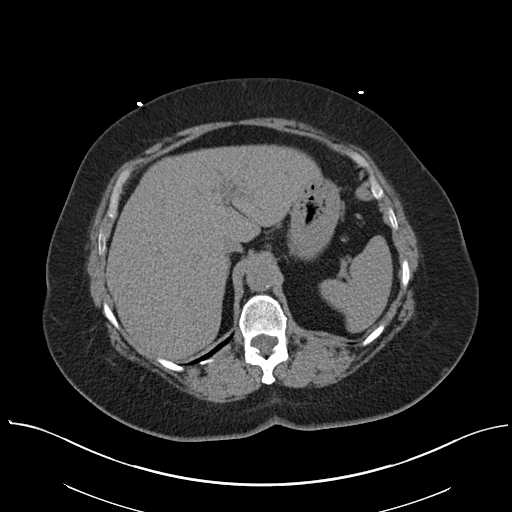
[im 75/92  soft-tissue]
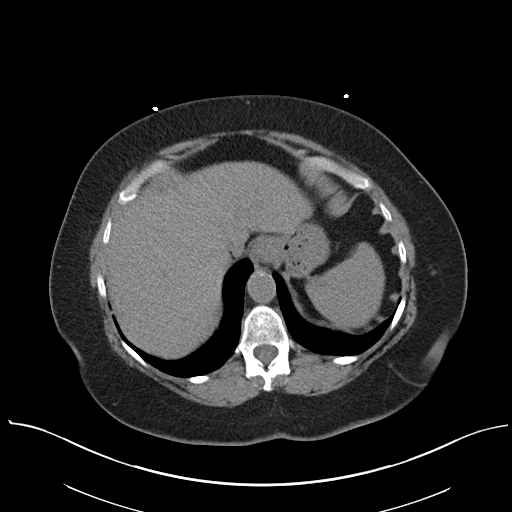
[im 81/92  soft-tissue]
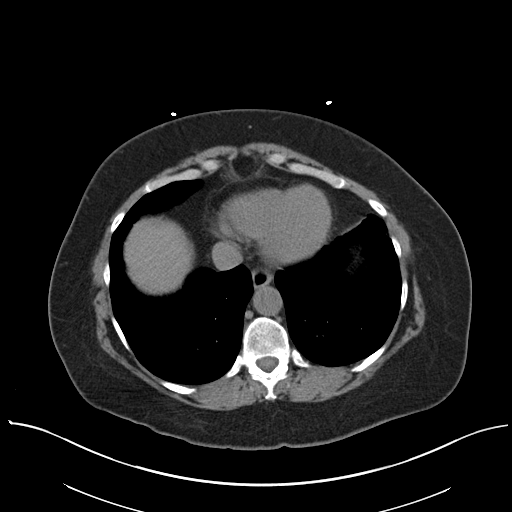
[im 86/92  soft-tissue]
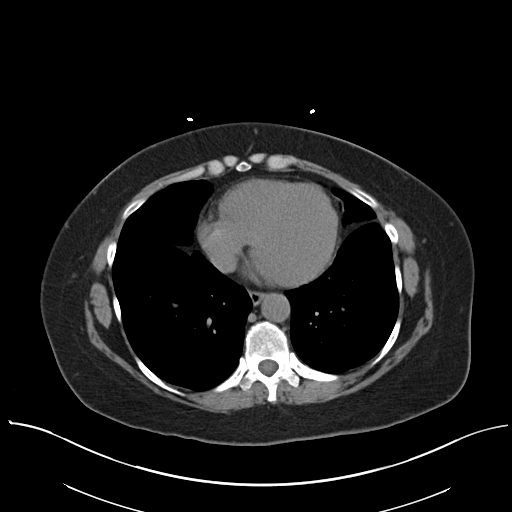

[Series 4: coronal st · coronal · 0.86mm/px · 3 of 101 slices shown]
[im 34/101  soft-tissue]
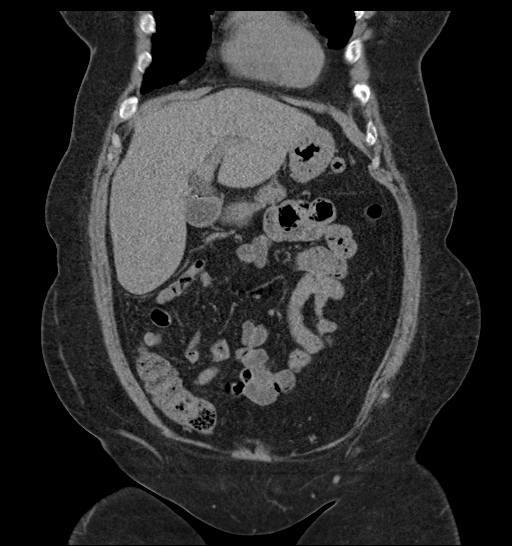
[im 45/101  soft-tissue]
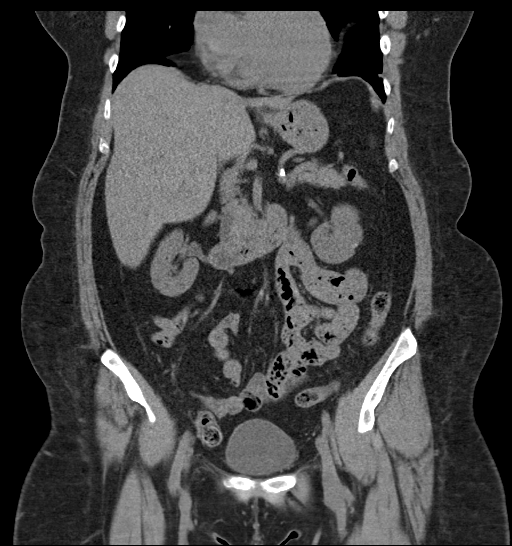
[im 56/101  soft-tissue]
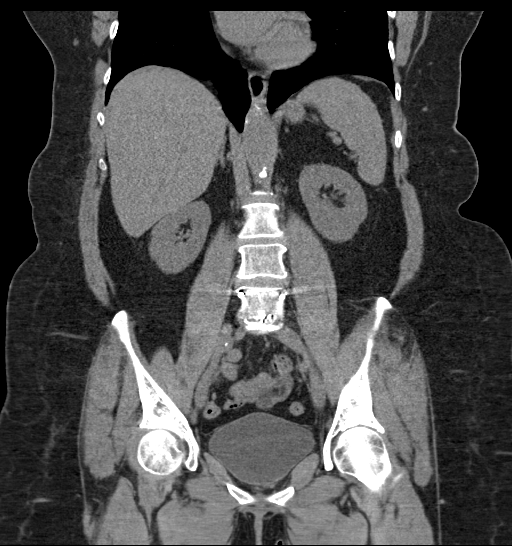

[17 of 46 positions shown; findings below may reference images not displayed]

FINDINGS: Lower chest: 3 mm left lung base stable pulmonary nodule compatible
benign etiology.

Hepatobiliary: No focal liver abnormality is seen. Status post
cholecystectomy. Stable enlargement of the common bile duct to 15
mm, probably compensatory post cholecystectomy, no intrahepatic
biliary ductal dilatation..

Pancreas: Unremarkable. No pancreatic ductal dilatation or
surrounding inflammatory changes.

Spleen: Normal in size without focal abnormality.

Adrenals/Urinary Tract: Adrenal glands are unremarkable. Kidneys are
normal, without renal calculi, focal lesion, or hydronephrosis.
Bladder is unremarkable.

Stomach/Bowel: Stomach is within normal limits. Appendectomy. No
evidence of bowel wall thickening, distention, or inflammatory
changes.

Vascular/Lymphatic: Aortic atherosclerosis. No enlarged abdominal or
pelvic lymph nodes.

Reproductive: Status post hysterectomy. No adnexal masses.

Other: No abdominal wall hernia or abnormality. No abdominopelvic
ascites.

Musculoskeletal: L5-S1 posterior instrumentation fusion hardware
without apparent hardware related complication. No acute fracture.
IMPRESSION: 1. No acute process identified.
2. Aortic atherosclerosis.
3. Cholecystectomy. Stable prominent common bile duct, probably
compensatory post cholecystectomy. No intrahepatic biliary ductal
dilatation.

By: Edelmo Av M.D.

## 2019-04-24 IMAGING — MR MR HEAD W/O CM
10 series · 42 of 48 positions shown · non-contrast
Comparison: Head CT without contrast 5652 hours today and earlier.
[HOSPITAL] Brain MRI 05/18/2017.

CLINICAL DATA: 56-year-old female with headache. Chest pain and
hypertension.

EXAM:
MRI HEAD WITHOUT CONTRAST
TECHNIQUE: Multiplanar, multiecho pulse sequences of the brain and surrounding
structures were obtained without intravenous contrast.

[Series 3: T2 · axial · 5.0mm · 0.86mm/px · z∈[-37,+100]mm · 3 of 24 slices shown (1 of 2)]
[im 1/24]
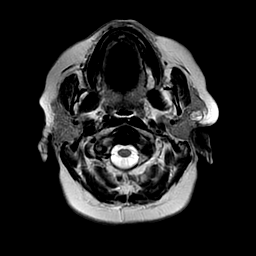
[im 12/24]
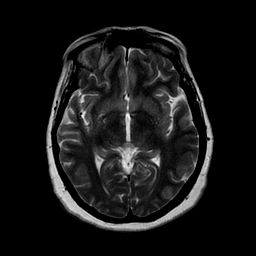
[im 24/24]
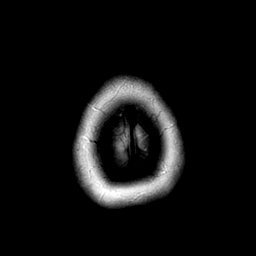

[Series 3: DWI · axial · 4.0mm · 1.17mm/px · z∈[-58,+81]mm · 8 of 64 slices shown (1 of 4)]
[im 1/64]
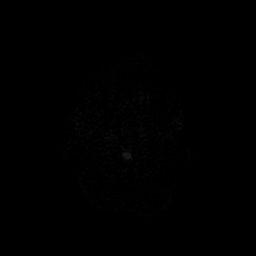
[im 8/64]
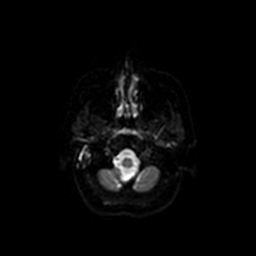
[im 22/64]
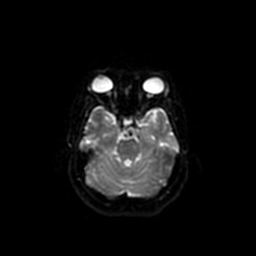
[im 29/64]
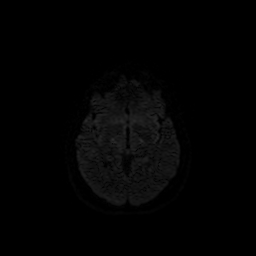
[im 36/64]
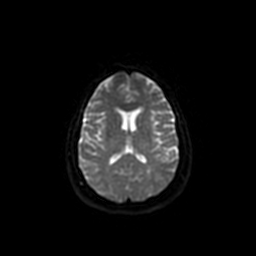
[im 43/64]
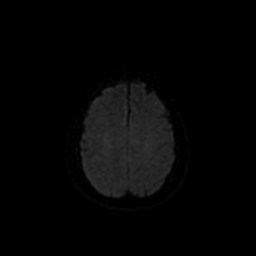
[im 57/64]
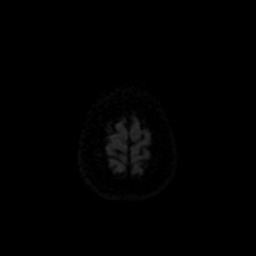
[im 64/64]
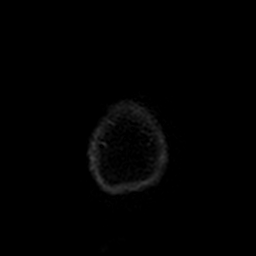

[Series 4: DWI · coronal · 5.0mm · 1.09mm/px · 9 of 66 slices shown (2 of 4)]
[im 1/66]
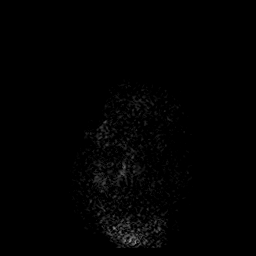
[im 9/66]
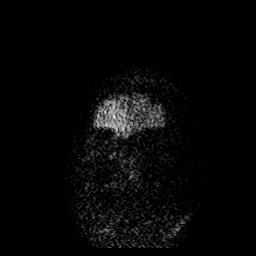
[im 17/66]
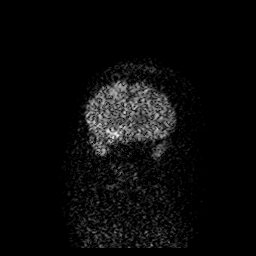
[im 25/66]
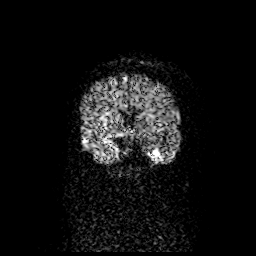
[im 33/66]
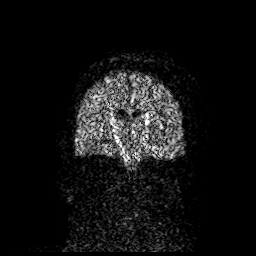
[im 41/66]
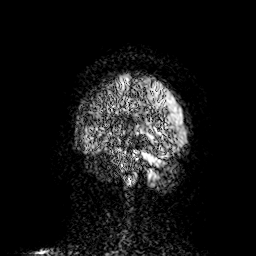
[im 49/66]
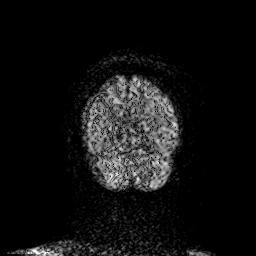
[im 57/66]
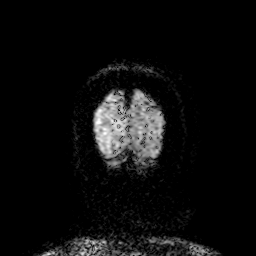
[im 66/66]
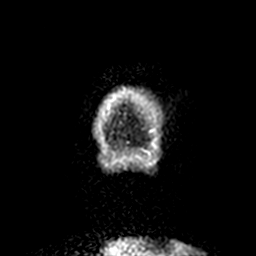

[Series 4: FLAIR · axial · 5.0mm · 0.86mm/px · z∈[-37,+100]mm · 3 of 24 slices shown]
[im 1/24]
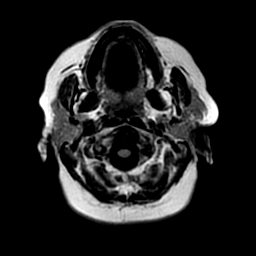
[im 12/24]
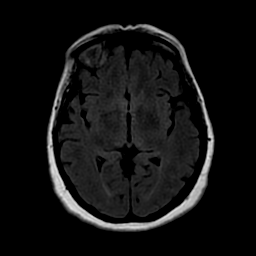
[im 24/24]
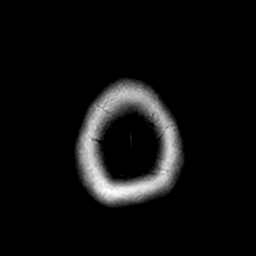

[Series 5: ax mpgr · axial · 5.0mm · 0.43mm/px · z∈[-48,+112]mm · 3 of 24 slices shown]
[im 1/24]
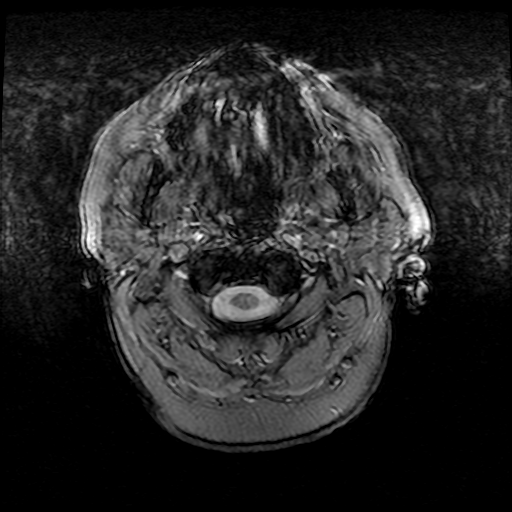
[im 12/24]
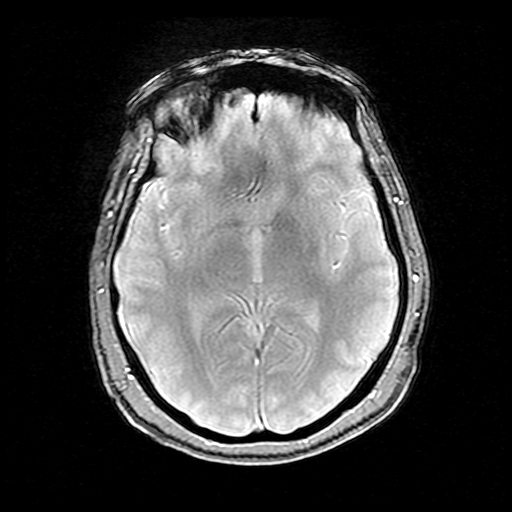
[im 24/24]
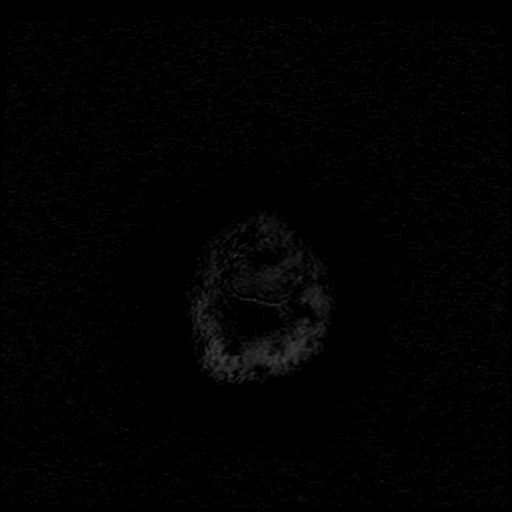

[Series 5: T1 · sagittal · 5.0mm · 0.47mm/px · 3 of 22 slices shown]
[im 1/22]
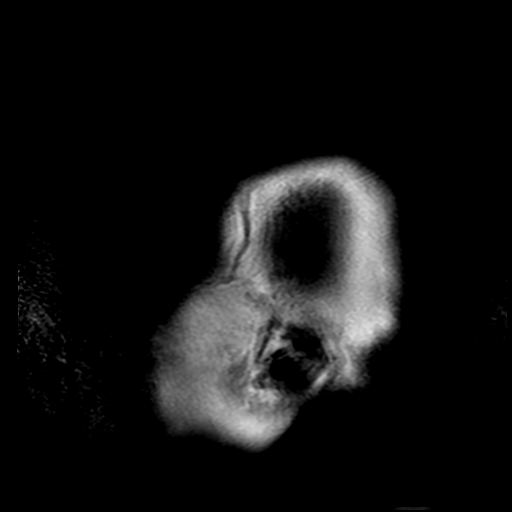
[im 11/22]
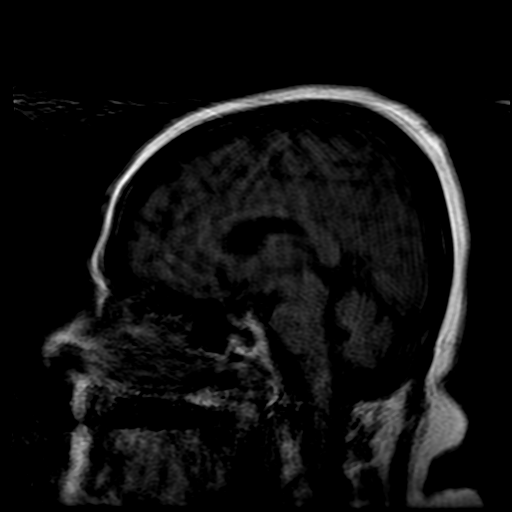
[im 22/22]
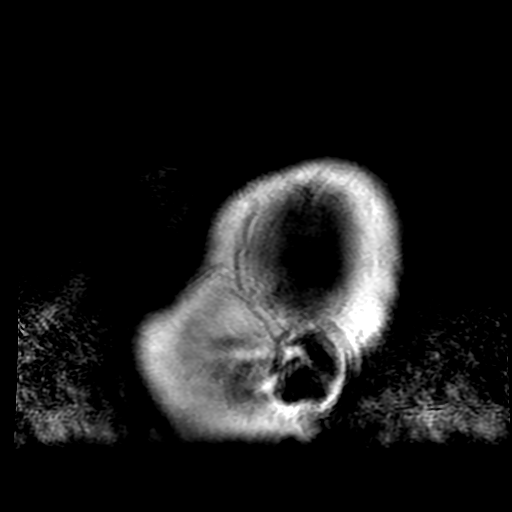

[Series 6: ax fspgr irp · axial · 3.0mm · 0.47mm/px · z∈[-31,-4]mm · 2 of 46 slices shown]
[im 1/46]
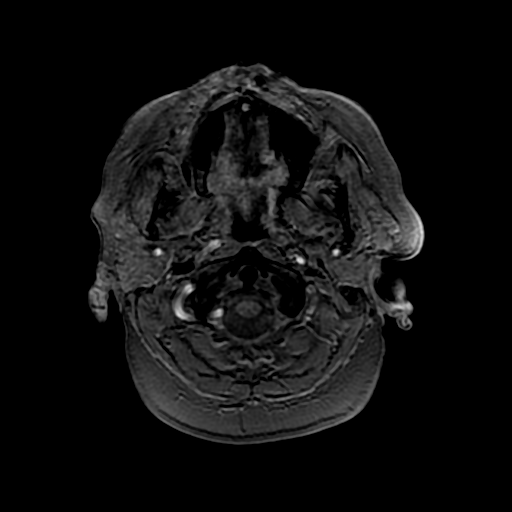
[im 10/46]
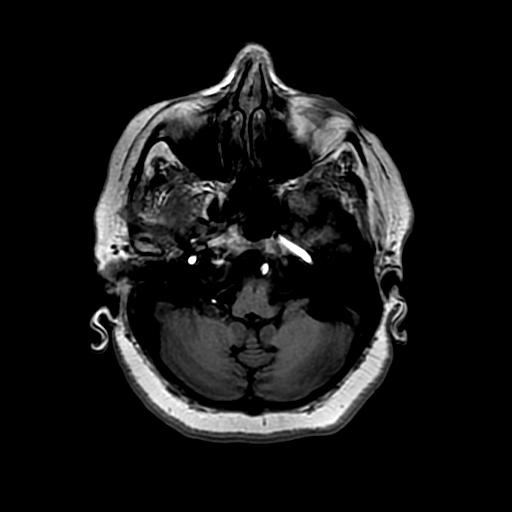

[Series 7: T2 · coronal · 5.0mm · 0.90mm/px · 3 of 25 slices shown (2 of 2)]
[im 1/25]
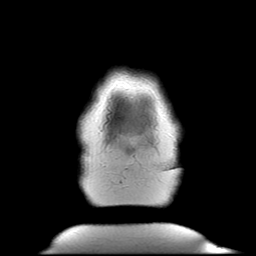
[im 13/25]
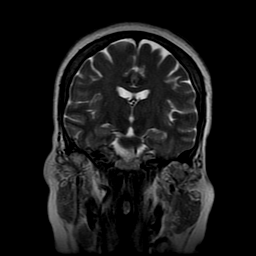
[im 25/25]
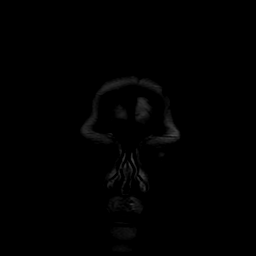

[Series 300: DWI · axial · 4.0mm · 1.17mm/px · z∈[-58,+81]mm · 4 of 32 slices shown (3 of 4)]
[im 1/32]
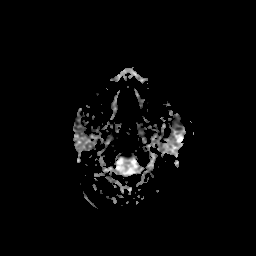
[im 11/32]
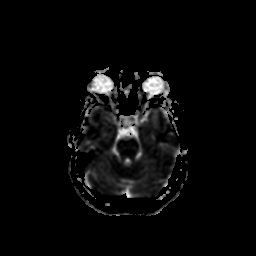
[im 21/32]
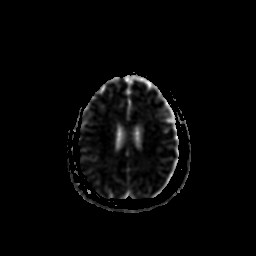
[im 32/32]
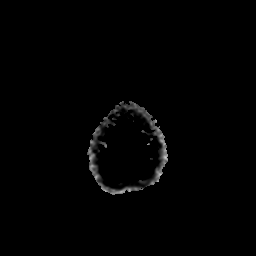

[Series 400: DWI · coronal · 5.0mm · 1.09mm/px · 4 of 33 slices shown (4 of 4)]
[im 1/33]
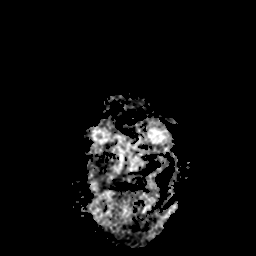
[im 11/33]
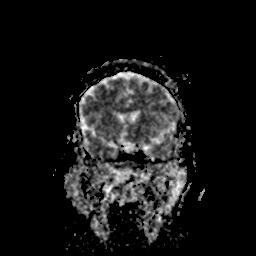
[im 22/33]
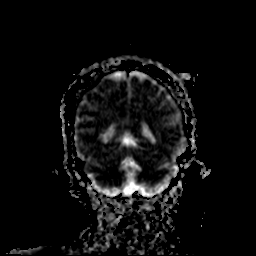
[im 33/33]
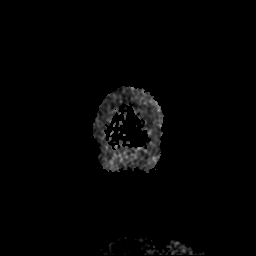

[42 of 48 positions shown; findings below may reference images not displayed]

FINDINGS: The study is intermittently degraded by motion artifact despite
repeated imaging attempts.

Brain: Cerebral volume remains within normal limits. No restricted
diffusion to suggest acute infarction. No midline shift, mass
effect, evidence of mass lesion, ventriculomegaly, extra-axial
collection or acute intracranial hemorrhage. Cervicomedullary
junction and pituitary are within normal limits.

Scattered chronic nonspecific cerebral white matter T2 and FLAIR
hyperintensity appears stable. The extent is moderate for age. No
cortical encephalomalacia or chronic cerebral blood products
identified. The bilateral deep gray matter nuclei, the brainstem,
and cerebellum remain normal.

Vascular: Major intracranial vascular flow voids are stable.

Skull and upper cervical spine: Stable bone marrow signal.

Sinuses/Orbits: Stable and negative.

Other: Trace left mastoid fluid is stable since 1482. A mild right
mastoid effusion has developed. The nasopharynx appears stable and
negative. Scalp and face soft tissues appear stable and negative.
IMPRESSION: 1.  No acute intracranial abnormality.
2. Continued stable noncontrast MRI appearance of the brain since
1482, with chronic moderately advanced but nonspecific cerebral
white matter signal changes.
3. New mild right mastoid effusion since 1482, probably
postinflammatory and significance doubtful.

## 2019-04-26 IMAGING — RF DG ESOPHAGUS
7 of 8 series · 17 of 23 positions shown · non-contrast
Comparison: 07/19/2017 chest CT.

CLINICAL DATA: 56-year-old female with dysphagia. Initial
encounter.

EXAM:
ESOPHOGRAM/BARIUM SWALLOW
TECHNIQUE: Single contrast examination was performed using  thin barium.
FLUOROSCOPY TIME:  Fluoroscopy Time:  1 minutes and 12 seconds.
Radiation Exposure Index: 14.5 mGy

[Series 1: cp_standard · 0.25mm/px · 1 of 1 slices shown (1 of 7)]
[im 1/1]
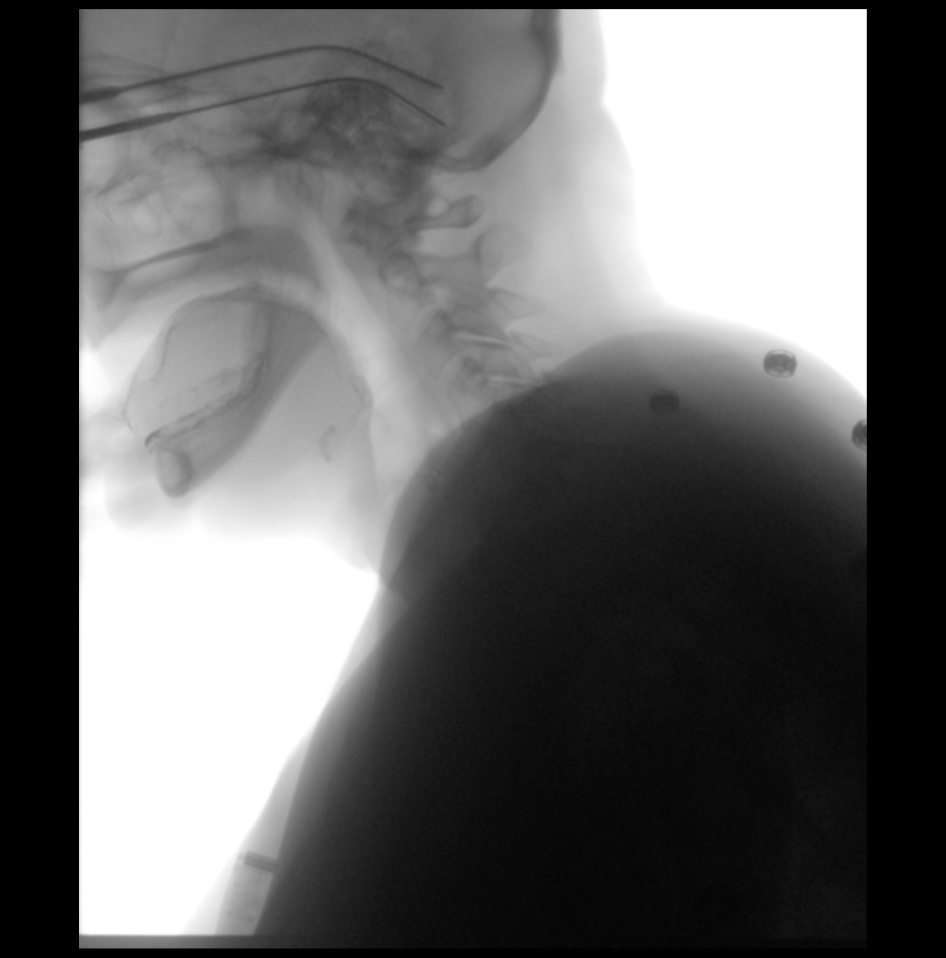

[Series 2: cp_standard · 0.51mm/px · 3 of 95 frames shown (2 of 7)]
[frame 15/95]
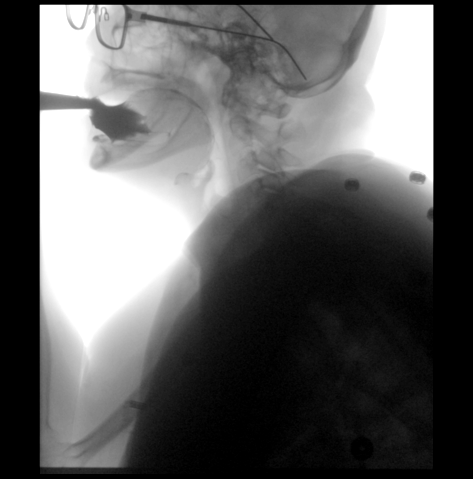
[frame 48/95]
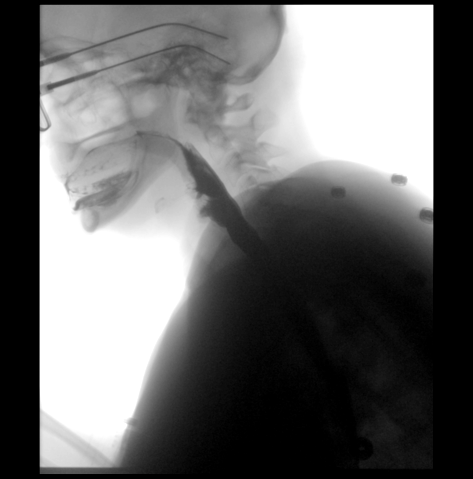
[frame 81/95]
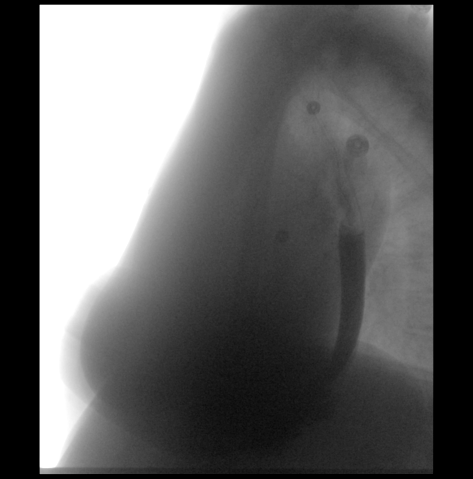

[Series 4: cp_standard · 0.51mm/px · 3 of 74 frames shown (3 of 7)]
[frame 2/74]
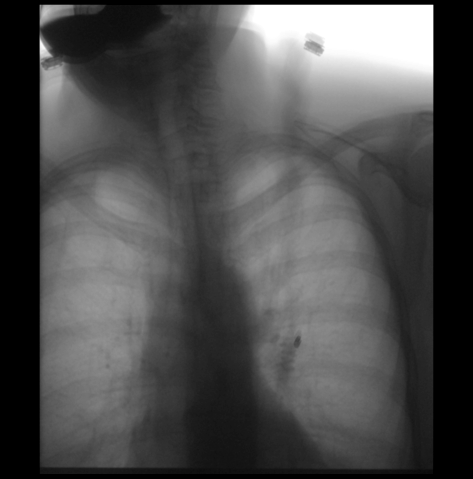
[frame 12/74]
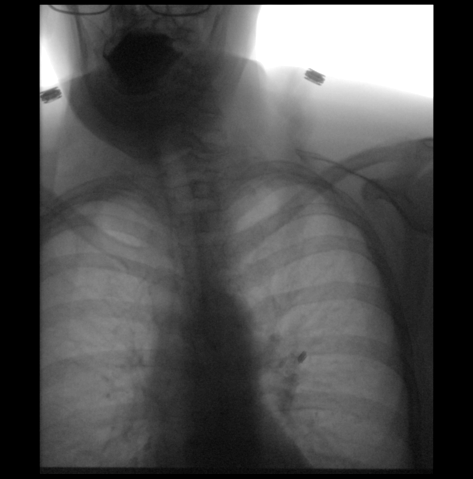
[frame 38/74]
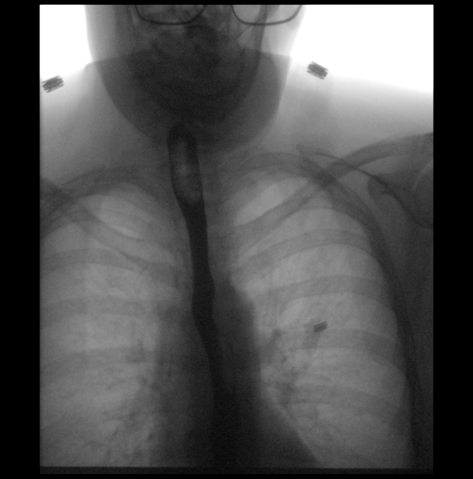

[Series 5: cp_standard · 0.51mm/px · 3 of 90 frames shown (4 of 7)]
[frame 5/90]
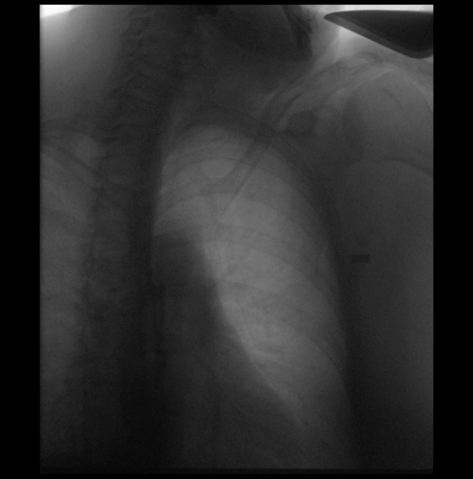
[frame 14/90]
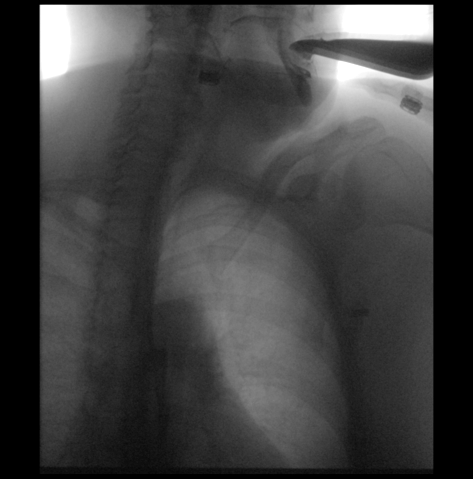
[frame 46/90]
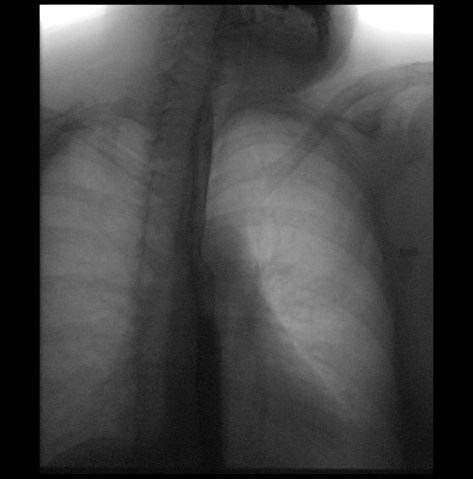

[Series 6: cp_standard · 0.25mm/px · 1 of 1 slices shown (5 of 7)]
[im 1/1]
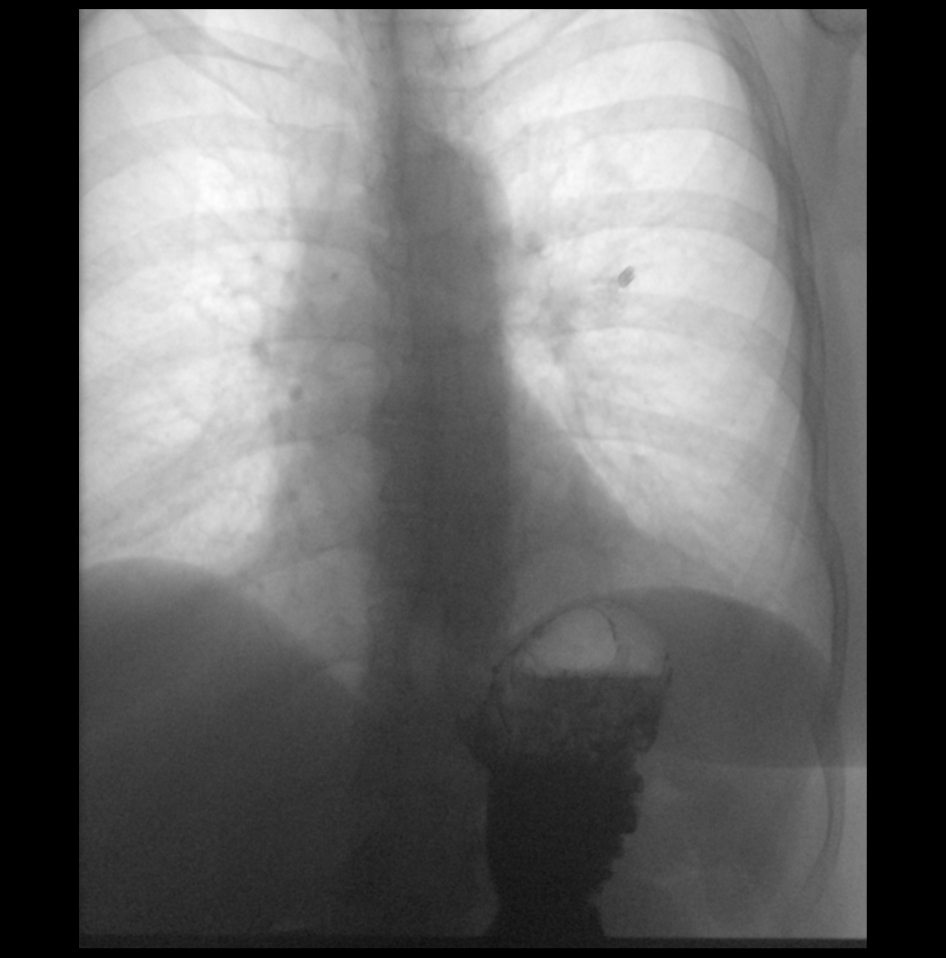

[Series 7: cp_standard · 0.51mm/px · 3 of 71 frames shown (6 of 7)]
[frame 11/71]
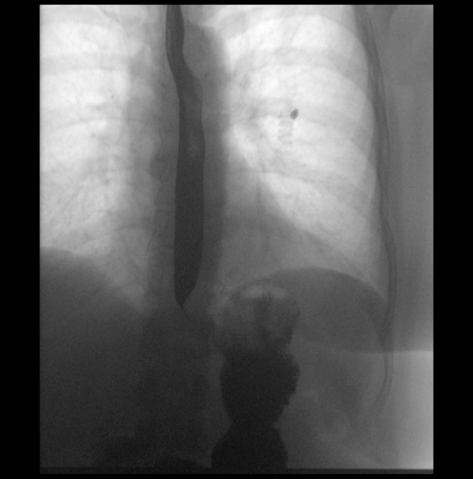
[frame 33/71]
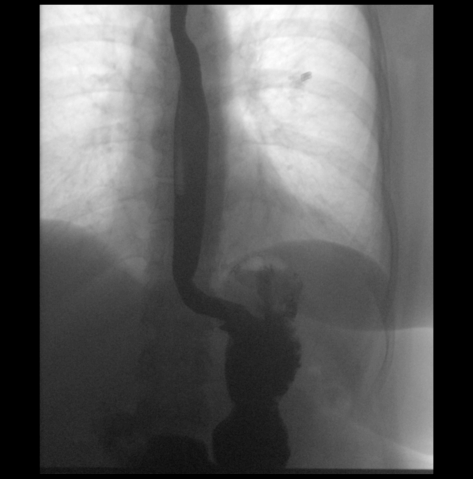
[frame 61/71]
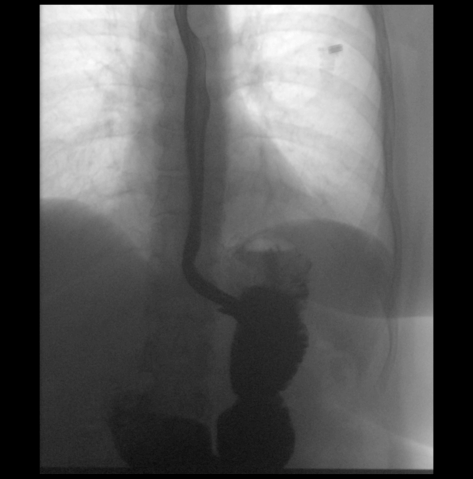

[Series 8: cp_standard · 0.51mm/px · 3 of 13 frames shown (7 of 7)]
[frame 2/13]
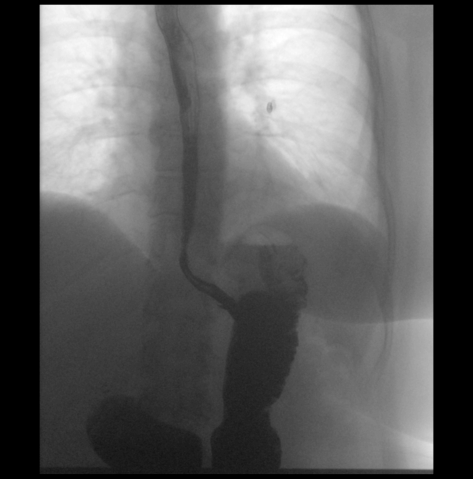
[frame 3/13]
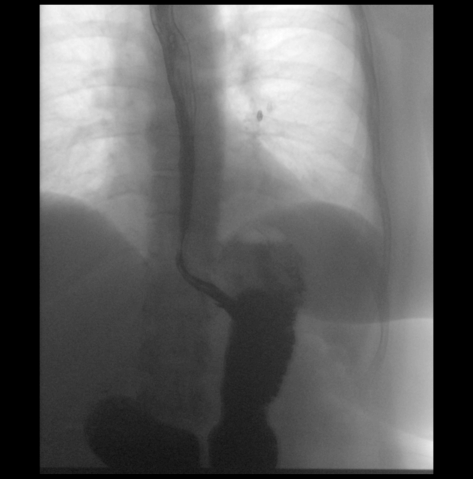
[frame 12/13]
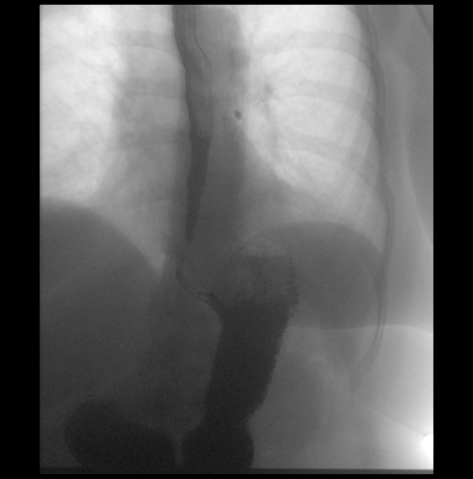

[17 of 23 positions shown; findings below may reference images not displayed]

FINDINGS: Examination was tailored to the patient. Single contrast technique
was utilized.

No laryngeal penetration or aspiration.

Normal primary esophageal stripping wave.

Patient had sensation of gagging with ingestion of barium.

No esophageal mucosa abnormality, reflux or mass identified.

Mild smooth narrowing of the distal esophagus. A 13 mm barium tablet
became temporarily lodged at this level but cleared immediately with
subsequent swallowing.

No reflux demonstrated.
IMPRESSION: Patient had sensation of gagging with ingestion of barium.

Normal primary esophageal stripping wave.

No esophageal obstructing lesion noted.

Mild smooth narrowing of the distal esophagus. 13 mm barium tablet
becomes temporarily lodged at this level but immediately clears upon
subsequent ingestion of liquid.

## 2019-05-07 ENCOUNTER — Telehealth: Payer: Self-pay | Admitting: Cardiovascular Disease

## 2019-05-07 NOTE — Telephone Encounter (Signed)

## 2019-05-09 ENCOUNTER — Encounter: Payer: Self-pay | Admitting: Student in an Organized Health Care Education/Training Program

## 2019-05-09 NOTE — Progress Notes (Signed)
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 bilaterally with mild bilateral foraminal stenosis, left slightly greater than right. No spinal stenosis.  C7-T1:  No significant findings.  IMPRESSION: 1. Very limited examination due to patient motion. No obvious cervical spinal cord lesions to suggest MS plaques. 2. Right-sided disc osteophyte complexes at C4-5 and C5-6 with right foraminal encroachment on the C5 and C6 nerve roots as above. 3. Bilateral shallow disc osteophyte complexes at C6-7 with bilateral foraminal encroachment.   Electronically Signed   By: Kalman Jewels M.D.   On: 03/20/2014 09:16  Results for orders placed during the hospital encounter of 04/05/18  CT Cervical Spine Wo Contrast   Narrative CLINICAL DATA:  Migraine headaches 5  days.  Pain radiates down neck.  EXAM: CT HEAD WITHOUT CONTRAST  CT CERVICAL SPINE WITHOUT CONTRAST  TECHNIQUE: Multidetector CT imaging of the head and cervical spine was performed following the standard protocol without intravenous contrast. Multiplanar CT image reconstructions of the cervical spine were also generated.  COMPARISON:  Head CT 02/06/2018 and 12/20/2017 as well as head and cervical spine CT 04/25/2017  FINDINGS: CT HEAD FINDINGS  Brain: Ventricles, cisterns and other CSF spaces are normal. There is no mass, mass effect, shift of midline structures or acute hemorrhage. No evidence of acute infarction.  Vascular: No hyperdense vessel or unexpected calcification.  Skull: Normal. Negative for fracture or focal lesion.  Sinuses/Orbits: Orbits are within normal. Paranasal sinuses are well developed as there is minimal opacification over the ethmoid air cells.  Other: None.  CT CERVICAL SPINE FINDINGS  Alignment: Normal.  Skull base and vertebrae: There is mild spondylosis throughout the cervical spine. Vertebral body heights are normal. Atlantoaxial articulation is within normal. There is uncovertebral joint spurring and facet arthropathy. There is no acute fracture or subluxation. There is mild neural foraminal narrowing at several levels of the mid to lower cervical spine due to spurring.  Soft tissues and spinal canal: No prevertebral fluid or swelling. No visible canal hematoma.  Disc levels: Mild disc space narrowing at the C6-7 level and to lesser extent the C5-6 level.  Upper chest: Negative.  Other: None.  IMPRESSION: No acute brain injury.  No acute cervical spine injury.  Mild spondylosis of the cervical spine with minimal disc disease at the C5-6 and C6-7 levels. Mild neural foraminal narrowing at multiple levels of the mid to lower cervical spine.   Electronically Signed   By: Marin Olp M.D.   On: 04/05/2018 02:38     Shoulder-R MR wo contrast:  Results for orders placed during the hospital encounter of 11/17/16  MR SHOULDER RIGHT WO CONTRAST   Narrative CLINICAL DATA:  Right shoulder pain for 4 months. Worsening pain with motion. No acute injury or prior relevant surgery.  EXAM: MRI OF THE RIGHT SHOULDER WITHOUT CONTRAST  TECHNIQUE: Multiplanar, multisequence MR imaging of the shoulder was performed. No intravenous contrast was administered.  COMPARISON:  Radiographs 10/14/2016.  Chest CT 08/03/2016.  FINDINGS: Rotator cuff: Mild supraspinatus tendinosis without evidence of tear. The infraspinatus, subscapularis and teres minor tendons appear normal.  Muscles:  No focal muscular atrophy or edema.  Biceps long head:  Intact and normally positioned.  Acromioclavicular Joint: The acromion is type 1 with lateral downsloping. There are mild-to-moderate acromioclavicular degenerative changes. Trace fluid is present in the subacromial -subdeltoid bursa.  Glenohumeral Joint: No significant shoulder joint effusion or glenohumeral arthropathy.  Labrum:  No evidence of labral tear.  Bones: No acute or significant extra-articular osseous findings.  Other: No significant soft tissue findings.  IMPRESSION: 1. Mild supraspinatus tendinosis.  No evidence of rotator cuff tear. 2. The labrum and biceps tendon appear intact. 3. Acromioclavicular degenerative changes may contribute to superior shoulder pain.   Electronically Signed   By: Richardean Sale M.D.   On: 11/17/2016 11:57    Shoulder-R DG:  Results for orders placed during the hospital encounter of 06/18/17  DG Shoulder Right   Narrative CLINICAL DATA:  Fall  EXAM: RIGHT SHOULDER - 2+ VIEW  COMPARISON:  None.  FINDINGS: No acute  fracture. No dislocation.  Unremarkable soft tissues.  IMPRESSION: No acute bony pathology.   Electronically Signed   By: Marybelle Killings M.D.   On: 06/18/2017 15:53    Shoulder-L DG:   Results for orders placed during the hospital encounter of 11/29/18  DG Shoulder Left   Narrative CLINICAL DATA:  58 year old female with a history of left shoulder pain  EXAM: LEFT SHOULDER - 2+ VIEW  COMPARISON:  11/27/2018  FINDINGS: Glenohumeral joint congruent. No acute displaced fracture. Degenerative changes at the acromioclavicular joint. Minimal subacromial spurring. No focal soft tissue swelling.  IMPRESSION: No acute bony abnormality.  Degenerative changes of the acromioclavicular joint, with minimal subacromial spurring.   Electronically Signed   By: Corrie Mckusick D.O.   On: 11/30/2018 09:54     Lumbar MR wo contrast:  Results for orders placed during the hospital encounter of 01/15/16  MR Lumbar Spine Wo Contrast   Narrative CLINICAL DATA:  Lumbar radiculopathy.  Left sided pain  MRI was done under general anesthesia due to claustrophobia and failed MRI in the past. A good quality study was obtained  EXAM: MRI LUMBAR SPINE WITHOUT CONTRAST  TECHNIQUE: Multiplanar, multisequence MR imaging of the lumbar spine was performed. No intravenous contrast was administered.  COMPARISON:  CT lumbar spine 07/02/2015  FINDINGS: Segmentation: Normal segmentation. Lowest disc space L5-S1. Surgical fusion at L5-S1.  Alignment:  Mild retrolisthesis L4-5.  Remaining alignment normal.  Vertebrae: Negative for fracture. Negative for mass or metastatic disease. Normal bone marrow.  Conus medullaris: Extends to the mid L1 level and appears normal.  Paraspinal and other soft tissues: Paraspinous muscles are symmetric and without focal abnormality. No retroperitoneal mass or adenopathy.  Disc levels:  T12-L1: Small left-sided disc protrusion without stenosis.  L1-2:  Negative  L2-3: Small left lateral disc protrusion. Left foramen adequately patent. No significant spinal stenosis  L3-4: Mild disc degeneration. Small left lateral disc protrusion  and associated osteophyte is noted on the prior CT. Mild left foraminal narrowing without compression of the left L3 nerve root. No significant spinal stenosis.  L4-5: Disc degeneration. Right lateral disc protrusion with associated osteophyte is noted on the prior CT. Moderate right foraminal narrowing without compression of the right L4 nerve root. No significant spinal stenosis  L5-S1: Pedicle screw and interbody fusion. Posterior decompression without spinal stenosis. Neural foramina adequately patent.  IMPRESSION: Small left-sided disc protrusion T12-L1  Small left lateral disc protrusion L2-3 without significant neural impingement  Small left lateral disc protrusion and associated osteophyte at L3-4 unchanged from the CT. Mild left foraminal narrowing  Right lateral disc protrusion L4-5 with associated osteophyte unchanged from the prior CT. Moderate right foraminal narrowing  Pedicle screw and interbody fusion L5-S1. No significant spinal or foraminal stenosis.   Electronically Signed   By: Franchot Gallo M.D.   On: 01/15/2016 13:35     Lumbar CT wo contrast:  Results for orders placed during the hospital encounter of 07/02/15  CT Lumbar Spine Wo Contrast   Addendum ADDENDUM REPORT: 07/02/2015 12:55 ADDENDUM: For some reason, head CT and cervical spine CT is being linked with the lumbar study. Head CT: The brain has a normal appearance without evidence of malformation, atrophy, old or acute infarction, mass lesion, hemorrhage, hydrocephalus or extra-axial collection. The calvarium is unremarkable. The paranasal sinuses, middle ears and mastoids are clear. Cervical spine CT: No acute or traumatic finding. There is ordinary osteoarthritis at the C1-2 articulation. There is ordinary spondylosis at C4-5 and C5-6 with  small endplate osteophytes. Mild foraminal encroachment at those levels without severe stenosis. There is minimal spondylosis at C6-7 with mild foraminal  encroachment on the left. No facet arthropathy of significance. No focal lesion. Head CT: Normal Cervical spine CT: No acute or traumatic finding. Mild spondylosis. Electronically Signed   By: Nelson Chimes M.D.   On: 07/02/2015 12:55      Nelson Chimes, MD 07/02/2015 12:57 PM         Narrative CLINICAL DATA:  [place] down 12 steps, striking a concrete wall. Injury 2 weeks ago. Low back pain. Previous lumbar fusion.  EXAM: CT LUMBAR SPINE WITHOUT CONTRAST  TECHNIQUE: Multidetector CT imaging of the lumbar spine was performed without intravenous contrast administration. Multiplanar CT image reconstructions were also generated.  COMPARISON:  Radiography 06/12/2014  FINDINGS: Alignment is normal. No evidence of fracture. No significant degenerative change at L 2 3 or above.  L3-4: Bulging of the disc, focally prominent in the left extra foraminal region. Mild facet degeneration and ligamentous hypertrophy. No central canal stenosis. Mild foraminal to extra foraminal encroachment on the left.  L4-5: Bulging of the disc, focally prominent in the right foraminal to extra foraminal region. Facet degeneration and hypertrophy. No central canal stenosis. Mild foraminal to extra foraminal encroachment on the right.  L5-S1: Previous posterior decompression, diskectomy and fusion. Fusion appears solid. Hardware well positioned. No canal or foraminal compromise.  Mild osteoarthritis of the sacroiliac joints.  IMPRESSION: No acute or traumatic finding. Previous decompression, diskectomy and fusion at L5-S1 has a satisfactory appearance.  Degenerative change at the L4-5 level. Foraminal to extra foraminal encroachment on the right because of osteophyte and bulging disc material.  Degenerative change at the L3-4 level. Foraminal to extra foraminal encroachment on the left because of osteophyte and bulging disc material.  Electronically Signed: By: Nelson Chimes M.D. On: 07/02/2015 10:03      Lumbar DG (Complete) 4+V:  Results for orders placed during the hospital encounter of 04/24/17  DG Lumbar Spine Complete   Narrative CLINICAL DATA:  Initial evaluation for acute trauma, fall.  EXAM: LUMBAR SPINE - COMPLETE 4+ VIEW  COMPARISON:  Prior radiograph from 05/06/2016.  FINDINGS: Five non rib-bearing lumbar type vertebral bodies. Vertebral bodies normally aligned with preservation of the normal lumbar lordosis. No listhesis. Patient is status post PLIF at L5-S1. Hardware intact without complication. No acute fracture. Visualized sacrum intact.  Aortic atherosclerosis.  Cholecystectomy clips noted.  IMPRESSION: 1. No radiographic evidence for acute abnormality within the lumbar spine. 2. Sequelae of prior PLIF at L5-S1 without hardware complication. 3. Aortic atherosclerosis.   Electronically Signed   By: Jeannine Boga M.D.   On: 04/25/2017 01:43           Results for orders placed during the hospital encounter of 06/18/17  DG Hip Unilat With Pelvis 2-3 Views Right   Narrative CLINICAL DATA:  Right hip pain following a fall at home today.  EXAM: DG HIP (WITH OR WITHOUT PELVIS) 2-3V RIGHT  COMPARISON:  04/25/2017.  FINDINGS: Normal appearing right hip without fracture or dislocation. Lower lumbar spine postoperative changes with fixation hardware.  IMPRESSION: No fracture or dislocation.   Electronically Signed   By: Claudie Revering M.D.   On: 06/18/2017 15:56    Hip-L DG 2-3 views:  Results for orders placed during the hospital encounter of 12/10/15  DG HIP UNILAT WITH PELVIS 2-3 VIEWS LEFT   Narrative CLINICAL DATA:  Acute onset of left groin pain.  Initial encounter.  EXAM: DG HIP (WITH OR WITHOUT PELVIS) 2-3V LEFT  COMPARISON:  None.  FINDINGS: There is no evidence of fracture or dislocation. Both femoral heads are seated normally within their respective acetabula. The proximal left femur appears intact. Lumbosacral spinal fusion  hardware is noted. The sacroiliac joints are unremarkable in appearance.  The visualized bowel gas pattern is grossly unremarkable in appearance.  IMPRESSION: No evidence of fracture or dislocation.   Electronically Signed   By: Garald Balding M.D.   On: 12/11/2015 01:19     Knee-R DG 4 views:  Results for orders placed during the hospital encounter of 04/24/17  DG Knee Complete 4 Views Right   Narrative CLINICAL DATA:  Initial evaluation for acute trauma, fall.  EXAM: RIGHT KNEE - COMPLETE 4+ VIEW  COMPARISON:  None.  FINDINGS: No evidence of fracture, dislocation, or joint effusion. No evidence of arthropathy or other focal bone abnormality. Soft tissues are unremarkable.  IMPRESSION: No acute osseous abnormality about the right knee.   Electronically Signed   By: Jeannine Boga M.D.   On: 04/25/2017 01:40    Ankle Imaging: Ankle-R DG Complete:  Results for orders placed during the hospital encounter of 06/18/17  DG Ankle Complete Right   Narrative CLINICAL DATA:  Right ankle pain following a fall at home today.  EXAM: RIGHT ANKLE - COMPLETE 3+ VIEW  COMPARISON:  06/13/2015.  FINDINGS: Moderately large posterior calcaneal spur. Better defined curvilinear calcific density distal to the lateral malleolus. No fracture, dislocation or effusion.  IMPRESSION: No fracture or dislocation.   Electronically Signed   By: Claudie Revering M.D.   On: 06/18/2017 15:57    Ankle-L DG Complete:  Results for orders placed during the hospital encounter of 02/17/17  DG Ankle Complete Left   Narrative CLINICAL DATA:  58 y/o F; Left ankle and pain without known injury. Prior history of foot and ankle fractures with surgical repair.  EXAM: LEFT ANKLE COMPLETE - 3+ VIEW; LEFT FOOT - COMPLETE 3+ VIEW  COMPARISON:  None.  FINDINGS: Left ankle:  Chronic fracture deformity of the lateral malleolus with a surgical screw. No acute fracture or dislocation  identified. Talar dome is intact. Ankle mortise is symmetric on these nonstress views. Small dorsal and plantar calcaneal bone spurs.  Left foot:  There is no evidence of fracture, dislocation, or joint effusion. There is no evidence of arthropathy or other focal bone abnormality. Soft tissues are unremarkable.  IMPRESSION: No acute fracture or dislocation identified. Chronic fracture deformity and postsurgical changes of lateral malleolus.   Electronically Signed   By: Kristine Garbe M.D.   On: 02/17/2017 22:34     Foot Imaging: Foot-R DG Complete:  Results for orders placed during the hospital encounter of 06/13/15  DG Foot Complete Right   Narrative CLINICAL DATA:  Stop She fell down about 10 steps today. Pain lateral side of foot. Hx of multiple fx's in the 90'x and early 2000.  EXAM: RIGHT FOOT COMPLETE - 3+ VIEW  COMPARISON:  None.  FINDINGS: No fracture or dislocation of mid foot or forefoot. The phalanges are normal. The calcaneus is normal. No soft tissue abnormality.  IMPRESSION: No fracture or dislocation.   Electronically Signed   By: Suzy Bouchard M.D.   On: 06/13/2015 16:34    Foot-L DG Complete:  Results for orders placed during the hospital encounter of 02/17/17  DG Foot Complete Left   Narrative CLINICAL DATA:  58 y/o F; Left ankle and pain without known  injury. Prior history of foot and ankle fractures with surgical repair.  EXAM: LEFT ANKLE COMPLETE - 3+ VIEW; LEFT FOOT - COMPLETE 3+ VIEW  COMPARISON:  None.  FINDINGS: Left ankle:  Chronic fracture deformity of the lateral malleolus with a surgical screw. No acute fracture or dislocation identified. Talar dome is intact. Ankle mortise is symmetric on these nonstress views. Small dorsal and plantar calcaneal bone spurs.  Left foot:  There is no evidence of fracture, dislocation, or joint effusion. There is no evidence of arthropathy or other focal bone  abnormality. Soft tissues are unremarkable.  IMPRESSION: No acute fracture or dislocation identified. Chronic fracture deformity and postsurgical changes of lateral malleolus.   Electronically Signed   By: Kristine Garbe M.D.   On: 02/17/2017 22:34     Elbow Imaging: Elbow-R DG Complete:  Results for orders placed during the hospital encounter of 06/18/17  DG Elbow Complete Right   Narrative CLINICAL DATA:  Right elbow pain following a fall at home today.  EXAM: RIGHT ELBOW - COMPLETE 3+ VIEW  COMPARISON:  None.  FINDINGS: There is no evidence of fracture, dislocation, or joint effusion. There is no evidence of arthropathy or other focal bone abnormality. Soft tissues are unremarkable.  IMPRESSION: Normal examination.   Electronically Signed   By: Claudie Revering M.D.   On: 06/18/2017 15:55    Elbow-L DG Complete: No results found for this or any previous visit.  Wrist Imaging: Wrist-R DG Complete:  Results for orders placed during the hospital encounter of 06/18/17  DG Wrist Complete Right   Narrative CLINICAL DATA:  Right wrist pain following a fall at home today.  EXAM: RIGHT WRIST - COMPLETE 3+ VIEW  COMPARISON:  None.  FINDINGS: There is no evidence of fracture or dislocation. There is no evidence of arthropathy or other focal bone abnormality. Soft tissues are unremarkable.  IMPRESSION: Normal examination.   Electronically Signed   By: Claudie Revering M.D.   On: 06/18/2017 15:54      Complexity Note: Imaging results reviewed. Results shared with Katrina [name], using Layman's terms.                         PFSH  Drug: Katrina [name]  reports no history of drug use. Alcohol:  reports no history of alcohol use. Tobacco:  reports that she has been smoking cigarettes. She started smoking about 48 years ago. She has a 45.00 pack-year smoking history. She has never used smokeless tobacco. Medical:  has a past medical history of Allergy, Anemia  (1975-1976 ), Anxiety, Arthritis, Asthma, Chronic bronchitis ([place]), Chronic kidney disease, Chronic lower back pain, Claustrophobia, COPD (chronic obstructive pulmonary disease) ([place]), Daily headache, Depression, Diverticulitis, Family history of adverse reaction to anesthesia, GERD (gastroesophageal reflux disease), Heart murmur, History of blood transfusion (1975-1976 "several"), History of colitis, History of colon polyps, History of gastric ulcer, History of hiatal hernia, History of MRSA infection (2017), Hyperlipidemia, Hypertension, Insomnia, Iron deficiency anemia, Lung nodules, Migraine, MS (multiple sclerosis) ([place]), Noncompliance, Osteoporosis, Peripheral neuropathy, PTSD (post-traumatic stress disorder) (dx'd 2016/2017), Restless leg syndrome, Stroke ([place]) (2015; 2016; 2017), and Type II diabetes mellitus ([place]). Family: family history includes Allergic rhinitis in her daughter and father; Asthma in her daughter; Bipolar disorder in her brother and daughter; Cancer in her father; Coronary artery disease in her father; Depression in her mother; Diabetes in her maternal grandmother; Drug abuse in her brother; Emphysema in her father; Heart attack in her brother  and sister; Heart attack (age of onset: 36) in her father; Leukemia in her brother; Liver disease in her cousin; Skin cancer in her mother; Stomach cancer in her maternal grandfather and another family member; Stroke in her father; Urticaria in her daughter.  Past Surgical History:  Procedure Laterality Date  . ABDOMINAL AORTOGRAM N/A 12/03/2016   Procedure: Abdominal Aortogram;  Surgeon: Angelia Mould, MD;  Location: [facility];  Service: Cardiovascular;  Laterality: N/A;  . ABDOMINAL HYSTERECTOMY  12/1987  . ADENOIDECTOMY  1975  . ANKLE SURGERY Bilateral 1993; 1995 X2   "stabilzation done; 1 on the right; 2 on the left"  . APPENDECTOMY  1989  . BREAST EXCISIONAL BIOPSY Left   . BREAST LUMPECTOMY Left    "benign tumor"   . CARDIAC CATHETERIZATION N/A 01/31/2015   Procedure: Left Heart Cath and Coronary Angiography;  Surgeon: Burnell Blanks, MD;  Location: [facility];  Service: Cardiovascular;  Laterality: N/A;  . COLONOSCOPY    . DILATION AND CURETTAGE OF UTERUS    . ESOPHAGOGASTRODUODENOSCOPY    . LAPAROSCOPIC CHOLECYSTECTOMY  1996  . NASAL RECONSTRUCTION  1976  . NASAL SINUS SURGERY  1975  . POSTERIOR LUMBAR FUSION  2005  . RADIOLOGY WITH ANESTHESIA N/A 01/15/2016   Procedure: MRI LUMBAR SPINE WITHOUT;  Surgeon: Medication Radiologist, MD;  Location: [facility];  Service: Radiology;  Laterality: N/A;  . RADIOLOGY WITH ANESTHESIA N/A 06/29/2016   Procedure: MRI OF THE BRAIN WITH AND WITHOUT;  Surgeon: Medication Radiologist, MD;  Location: [facility];  Service: Radiology;  Laterality: N/A;  . RENAL ANGIOGRAPHY N/A 12/03/2016   Procedure: Renal Angiography;  Surgeon: Angelia Mould, MD;  Location: [facility];  Service: Cardiovascular;  Laterality: N/A;  . TONSILLECTOMY  1992  . TUBAL LIGATION    . TUMOR EXCISION  1998   from back of skull; developed;  MRSA from the area that had to be packed   Active Ambulatory Problems    Diagnosis Date Noted  . Moderate COPD (chronic obstructive pulmonary disease) ([place]) 11/23/2011  . GERD (gastroesophageal reflux disease) 11/23/2011  . Uncontrolled hypertension 11/23/2011  . Dyspnea on exertion 03/05/2012  . Tobacco abuse 03/05/2012  . Diastolic dysfunction 00/17/4944  . Normal coronary arteries 03/20/2014  . PUD (peptic ulcer disease) 03/20/2014  . Type 2 diabetes, uncontrolled, with neuropathy ([place]) 03/20/2014  . Morbid obesity due to excess calories ([place]) 03/20/2014  . Left-sided weakness 03/20/2014  . Paresthesias 03/20/2014  . Cerebrovascular disease 03/20/2014  . Muscle weakness (generalized) 04/05/2014  . Stiffness of left hip joint 04/05/2014  . Pain in left hip 04/05/2014  . Hypokalemia 04/16/2014  . Malignant hypertension   .  TIA (transient ischemic attack) 12/25/2014  . Hypertensive urgency   . Atypical chest pain   . Migraine syndrome 01/24/2015  . Migraine 01/24/2015  . Precordial pain   . Post traumatic stress disorder 10/06/2015  . CVA (cerebral vascular accident) ([place]) 04/03/2016  . Stroke (cerebrum) ([place]) 04/03/2016  . History of CVA with residual deficit   . Vascular headache   . History of chest pain   . Generalized anxiety disorder   . Chronic bilateral low back pain without sciatica   . Peripheral neuropathy   . Migraine without status migrainosus, not intractable   . PTSD (post-traumatic stress disorder)   . History of cerebrovascular accident (CVA) with residual deficit   . Syncope 05/02/2016  . Type 2 diabetes mellitus with diabetic neuropathy ([place]) 05/02/2016  .  Restless leg syndrome 05/02/2016  . Non compliance with medical treatment 05/11/2016  . Leg pain, anterior, right 05/11/2016  . Cerebral microvascular disease 08/19/2016  . Cigarette smoker 09/24/2016  . Adverse food reaction 10/14/2016  . Pulmonary nodules 10/14/2016  . Non-seasonal allergic rhinitis due to fungal spores 10/14/2016  . Hypertension 12/03/2016  . Weakness of left leg 05/19/2017  . Abnormal brain MRI 08/08/2017  . Chest pain in adult 08/20/2017  . Left lower quadrant abdominal pain affecting pregnancy 12/21/2017  . Impaired insight 02/08/2018  . Adjustment disorder with disturbance of emotion 02/08/2018  . 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 Lumbar radiculopathy 05/10/2019  . Chronic radicular lumbar pain 05/10/2019  . History of lumbar fusion  (L5-S1 2005) 05/10/2019  . Neuroforaminal stenosis of lumbar spine 05/10/2019  . Lumbar degenerative disc disease 05/10/2019   Resolved Ambulatory Problems    Diagnosis Date Noted  . Chest discomfort 11/23/2011  . DM2 (diabetes mellitus, type 2) ([place]) 09/23/2013  . Suicidal ideation 02/08/2018   Past Medical History:  Diagnosis Date  .  Allergy   . Anemia 1975-1976   . Anxiety   . Arthritis   . Asthma   . Chronic bronchitis ([place])   . Chronic kidney disease   . Chronic lower back pain   . Claustrophobia   . COPD (chronic obstructive pulmonary disease) ([place])   . Daily headache   . Depression   . Diverticulitis   . Family history of adverse reaction to anesthesia   . Heart murmur   . History of blood transfusion 1975-1976 "several"  . History of colitis   . History of colon polyps   . History of gastric ulcer   . History of hiatal hernia   . History of MRSA infection 2017  . Hyperlipidemia   . Insomnia   . Iron deficiency anemia   . Lung nodules   . MS (multiple sclerosis) ([place])   . Noncompliance   . Osteoporosis   . Stroke [facility]) 2015; 2016; 2017  . Type II diabetes mellitus ([place])    Assessment  Primary Diagnosis & Pertinent Problem List: The primary encounter diagnosis was Lumbar radiculopathy. 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  Visit Diagnosis (New problems to examiner): 1. Lumbar radiculopathy   2. Chronic radicular lumbar pain   3. History of lumbar fusion  (L5-S1 2005)   4. Neuroforaminal stenosis of lumbar spine   5. Lumbar degenerative disc disease   6. 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 Cerebrovascular accident (CVA), unspecified mechanism ([place])   8. Chronic migraine without aura without status migrainosus, not intractable   9. Left-sided weakness   10. Weakness of left leg   11. Chronic pain syndrome    General Recommendations: The pain condition that the patient suffers  from is best treated with a multidisciplinary approach that involves an increase in physical activity to prevent de-conditioning and worsening of the pain cycle, as well as psychological counseling (formal and/or informal) to address the co-morbid psychological affects of pain. Treatment will often involve judicious use of pain medications and interventional procedures to decrease the pain, allowing the patient to participate in  the physical activity that will ultimately produce long-lasting pain reductions. The goal of the multidisciplinary approach is to return the patient to a higher level of overall function and to restore their ability to perform activities of daily living.  Patient has a history of lumbar spinal fusion L5-S1 that was performed in 2005 related to an accident that she experienced.  She was doing well up until a year ago when she started to notice worsening pain of her low back that radiates to her right posterior lateral thigh down to her right ankle.  She also has pain that radiates in a radicular fashion down her left lateral leg and stops at her knee.  No inciting or traumatic events in the last year.  She states that she does notice weakness of her left leg at times.  She has had falls this year.  To further understand her source of weakness especially in the context of prior lumbar spine fusion and having falls, recommend lumbar spine MRI.  Patient also has left-sided weakness as a result of a previous stroke.  She could also have a component of central pain syndrome.  She is also struggling with neuropathic pain of her feet for which she is on gabapentin 600 mg 4 times a day.  We discussed adding Cymbalta to help with her chronic pain syndrome and neuropathic pain.  She also experiences spasms of her lumbar paraspinal muscles and occasionally of her legs so we will prescribe tizanidine that she can take as needed.  Clearly informed the patient to not take this medication with Ambien.   She endorsed understanding.  Pending lumbar MRI results we will discuss treatment plan with the patient which could include diagnostic lumbar epidural steroid injection and/or possible spinal cord stimulation in the context of persistent and worsening lumbar radicular pain.  Patient endorsed understanding.  Will focus on non-opioid analgesics and interventional treatment option for her radicular pain.    Plan of Care (Initial workup plan)   Imaging Orders     MR Lumbar Spine w/o contrast  Pharmacotherapy (current): Medications ordered:  Meds ordered this encounter  Medications  . DULoxetine (CYMBALTA) 20 MG capsule    Sig: Take 1 capsule (20 mg total) by mouth daily for 30 days, THEN 2 capsules (40 mg total) daily.    Dispense:  150 capsule    Refill:  0  . tiZANidine (ZANAFLEX) 4 MG tablet    Sig: Take 0.5-1 tablets (2-4 mg total) by mouth at bedtime as needed for muscle spasms.    Dispense:  30 tablet    Refill:  2   Medications administered during this visit: Katrina L. Wilcock "Debby" had no medications administered during this visit.   Pharmacological management options:  Opioid Analgesics: N/A  Membrane stabilizer: On Gabapentin, add Cymbalta. Can consider Lyrica, TCA  Muscle relaxant: Tizanidine  NSAID: Medically contraindicatedon Plavix  Other analgesic(s): To be determined at a later time   Interventional management options: Katrina [name] was informed that there is no guarantee that she would be a candidate for interventional therapies. The decision will be based on the results of diagnostic studies, as well as Katrina [name]'s risk profile.  Procedure(s) under consideration:  Pending Lumbar MRI- lumbar ESI, Spinal cord stimulator trial.    Provider-requested follow-up: Return in about 6 weeks (around 06/21/2019) for 2nd visit , After Imaging.  Future Appointments  Date Time Provider [place]  05/11/2019 11:20 AM Herminio Commons, MD [facility]  Total duration of non-face-to-face encounter: 45 minutes.  Primary Care Physician: [name], FNP Location: [facility] Note by: Gillis Santa, MD Date: 05/10/2019; Time: 12:01 PM  Note: This dictation was prepared with Dragon dictation. Any transcriptional errors that may result from this process are unintentional.

## 2019-05-10 ENCOUNTER — Encounter: Payer: Self-pay | Admitting: Student in an Organized Health Care Education/Training Program

## 2019-05-10 ENCOUNTER — Other Ambulatory Visit: Payer: Self-pay

## 2019-05-10 ENCOUNTER — Ambulatory Visit
Payer: Medicare Other | Attending: Student in an Organized Health Care Education/Training Program | Admitting: Student in an Organized Health Care Education/Training Program

## 2019-05-10 VITALS — Ht 67.0 in | Wt 218.0 lb

## 2019-05-10 DIAGNOSIS — M9973 Connective tissue and disc stenosis of intervertebral foramina of lumbar region: Secondary | ICD-10-CM | POA: Diagnosis not present

## 2019-05-10 DIAGNOSIS — I6521 Occlusion and stenosis of right carotid artery: Secondary | ICD-10-CM

## 2019-05-10 DIAGNOSIS — M5136 Other intervertebral disc degeneration, lumbar region: Secondary | ICD-10-CM

## 2019-05-10 DIAGNOSIS — G894 Chronic pain syndrome: Secondary | ICD-10-CM

## 2019-05-10 DIAGNOSIS — I639 Cerebral infarction, unspecified: Secondary | ICD-10-CM

## 2019-05-10 DIAGNOSIS — Z981 Arthrodesis status: Secondary | ICD-10-CM | POA: Diagnosis not present

## 2019-05-10 DIAGNOSIS — G43709 Chronic migraine without aura, not intractable, without status migrainosus: Secondary | ICD-10-CM

## 2019-05-10 DIAGNOSIS — M5416 Radiculopathy, lumbar region: Secondary | ICD-10-CM

## 2019-05-10 DIAGNOSIS — G8929 Other chronic pain: Secondary | ICD-10-CM

## 2019-05-10 DIAGNOSIS — M48061 Spinal stenosis, lumbar region without neurogenic claudication: Secondary | ICD-10-CM | POA: Insufficient documentation

## 2019-05-10 DIAGNOSIS — R531 Weakness: Secondary | ICD-10-CM

## 2019-05-10 DIAGNOSIS — R29898 Other symptoms and signs involving the musculoskeletal system: Secondary | ICD-10-CM

## 2019-05-10 MED ORDER — DULOXETINE HCL 20 MG PO CPEP: ORAL_CAPSULE | ORAL | 0 refills | Status: DC

## 2019-05-10 MED ORDER — TIZANIDINE HCL 4 MG PO TABS: 2.0000 mg | ORAL_TABLET | Freq: Every evening | ORAL | 2 refills | Status: AC | PRN

## 2019-05-11 ENCOUNTER — Encounter: Payer: Self-pay | Admitting: Cardiovascular Disease

## 2019-05-11 ENCOUNTER — Telehealth (INDEPENDENT_AMBULATORY_CARE_PROVIDER_SITE_OTHER): Payer: Medicare Other | Admitting: Cardiovascular Disease

## 2019-05-11 VITALS — BP 218/112 | HR 81 | Ht 67.0 in | Wt 220.0 lb

## 2019-05-11 DIAGNOSIS — Z79899 Other long term (current) drug therapy: Secondary | ICD-10-CM

## 2019-05-11 DIAGNOSIS — I10 Essential (primary) hypertension: Secondary | ICD-10-CM | POA: Diagnosis not present

## 2019-05-11 DIAGNOSIS — Z8673 Personal history of transient ischemic attack (TIA), and cerebral infarction without residual deficits: Secondary | ICD-10-CM

## 2019-05-11 MED ORDER — LABETALOL HCL 200 MG PO TABS: 200.0000 mg | ORAL_TABLET | Freq: Two times a day (BID) | ORAL | 3 refills | Status: DC

## 2019-05-11 MED ORDER — HYDRALAZINE HCL 100 MG PO TABS: 100.0000 mg | ORAL_TABLET | Freq: Two times a day (BID) | ORAL | 3 refills | Status: DC

## 2019-05-11 NOTE — Patient Instructions (Signed)
Medication Instructions:  INCREASE Labetalol to 200 mg TWICE A DAY   START Hydralazine 100 mg twice a day   *If you need a refill on your cardiac medications before your next appointment, please call your pharmacy*  Lab Work: None today If you have labs (blood work) drawn today and your tests are completely normal, you will receive your results only by: Marland Kitchen MyChart Message (if you have MyChart) OR . A paper copy in the mail If you have any lab test that is abnormal or we need to change your treatment, we will call you to review the results.  Testing/Procedures: None today  Follow-Up: At [facility], you and your health needs are our priority.  As part of our continuing mission to provide you with exceptional heart care, we have created designated Provider Care Teams.  These Care Teams include your primary Cardiologist (physician) and Advanced Practice Providers (APPs -  Physician Assistants and Nurse Practitioners) who all work together to provide you with the care you need, when you need it.  Your next appointment:   6 month(s)  The format for your next appointment:   Virtual Visit   Provider:   Bernerd Pho, PA-C  Other Instructions None      Thank you for choosing [facility] !

## 2019-05-11 NOTE — Addendum Note (Signed)
Addended by: Barbarann Ehlers A on: 05/11/2019 10:57 AM   Modules accepted: Orders

## 2019-05-11 NOTE — Progress Notes (Signed)
Virtual Visit via Telephone Note   This visit type was conducted due to national recommendations for restrictions regarding the COVID-19 Pandemic (e.g. social distancing) in an effort to limit this patient's exposure and mitigate transmission in our community.  Due to her co-morbid illnesses, this patient is at least at moderate risk for complications without adequate follow up.  This format is felt to be most appropriate for this patient at this time.  The patient did not have access to video technology/had technical difficulties with video requiring transitioning to audio format only (telephone).  All issues noted in this document were discussed and addressed.  No physical exam could be performed with this format.  Please refer to the patient's chart for her  consent to telehealth for [facility].   Date:  05/11/2019   ID:  Katrina [name], DOB 29-Oct-1960, MRN [id number]  Patient Location: Home Provider Location: Home  PCP:  [name], FNP  Cardiologist:  Kate Sable, MD  Electrophysiologist:  None   Evaluation Performed:  Follow-Up Visit  Chief Complaint:  Accelerated HTN  History of Present Illness:    Katrina [name] is a 58 y.o. female with a history of accelerated hypertension, CVA, and medication noncompliance.  She has an adjustment disorder with emotional disturbance as well.  She has chest pain when her BP is elevated.  Chronic exertional dyspnea is stable.  She is feeling well. She got COVID in September. Energy levels are somewhat low as she has a cold or sinus infection.  She denies fevers.  She said she had a TIA in October in [place].   Past Medical History:  Diagnosis Date  . Allergy   . Anemia 1975-1976   . Anxiety    takes Valium daily as needed  . Arthritis    "spine" (12/03/2016)  . Asthma    has inhalers but doesn't use (12/03/2016)  . Chronic bronchitis ([place])    "get it a couple times q yr" (12/03/2016)  . Chronic kidney  disease   . Chronic lower back pain    budlging disc   . Claustrophobia   . COPD (chronic obstructive pulmonary disease) ([place])   . Daily headache   . Depression   . Diverticulitis   . Family history of adverse reaction to anesthesia    2 daughters gets extremely sick   . GERD (gastroesophageal reflux disease)    takes Dexilant daily  . Heart murmur   . History of blood transfusion 1975-1976 "several"   no abnormal reaction noted  . History of colitis   . History of colon polyps    benign  . History of gastric ulcer   . History of hiatal hernia    "small one"  . History of MRSA infection 2017  . Hyperlipidemia    was on meds but has been off over a yr  . Hypertension    takes Amlodipine and Maxzide daily  . Insomnia    takes Ambien nightly  . Iron deficiency anemia    "when I was young"  . Lung nodules   . Migraine    "2-3/wk" (12/03/2016)  . MS (multiple sclerosis) ([place])    questionable per pt  . Noncompliance   . Osteoporosis   . Peripheral neuropathy    weakness,numbness,and tingling. Takes Gabapentin daily  . PTSD (post-traumatic stress disorder) dx'd 2016/2017   "was dx'd w/bipolar in 1991; replaced w/PTSD dx 2016/2017" (12/03/2016)  . Restless leg syndrome    takes Requip daily  .  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 Type II diabetes mellitus ([place])    "went off insulin in 2012/2013" (12/03/2016)   Past Surgical History:  Procedure Laterality Date  . ABDOMINAL AORTOGRAM N/A 12/03/2016   Procedure: Abdominal Aortogram;  Surgeon: Angelia Mould, MD;  Location: [place] [facility];  Service: Cardiovascular;  Laterality: N/A;  . ABDOMINAL HYSTERECTOMY  12/1987  . ADENOIDECTOMY  1975  . ANKLE SURGERY Bilateral 1993; 1995 X2   "stabilzation done; 1 on the right; 2 on the left"  . APPENDECTOMY  1989  . BREAST EXCISIONAL BIOPSY Left   . BREAST LUMPECTOMY Left    "benign  tumor"  . CARDIAC CATHETERIZATION N/A 01/31/2015   Procedure: Left Heart Cath and Coronary Angiography;  Surgeon: Burnell Blanks, MD;  Location: [place] [facility];  Service: Cardiovascular;  Laterality: N/A;  . COLONOSCOPY    . DILATION AND CURETTAGE OF UTERUS    . ESOPHAGOGASTRODUODENOSCOPY    . LAPAROSCOPIC CHOLECYSTECTOMY  1996  . NASAL RECONSTRUCTION  1976  . NASAL SINUS SURGERY  1975  . POSTERIOR LUMBAR FUSION  2005  . RADIOLOGY WITH ANESTHESIA N/A 01/15/2016   Procedure: MRI LUMBAR SPINE WITHOUT;  Surgeon: Medication Radiologist, MD;  Location: [place];  Service: Radiology;  Laterality: N/A;  . RADIOLOGY WITH ANESTHESIA N/A 06/29/2016   Procedure: MRI OF THE BRAIN WITH AND WITHOUT;  Surgeon: Medication Radiologist, MD;  Location: [place];  Service: Radiology;  Laterality: N/A;  . RENAL ANGIOGRAPHY N/A 12/03/2016   Procedure: Renal Angiography;  Surgeon: Angelia Mould, MD;  Location: [place] [facility];  Service: Cardiovascular;  Laterality: N/A;  . TONSILLECTOMY  1992  . TUBAL LIGATION    . 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 amLODipine (NORVASC) 10 MG tablet Take 1 tablet (10 mg total) by mouth daily. (Patient taking differently: Take 10 mg by mouth every morning. )  . aspirin-acetaminophen-caffeine (EXCEDRIN EXTRA STRENGTH) 250-250-65 MG tablet Take 2 tablets by mouth daily as needed for headache or migraine.   . cloNIDine (CATAPRES) 0.1 MG tablet Take 1 tablet (0.1 mg total) by mouth 2 (two) times daily.  . clopidogrel (PLAVIX) 75 MG tablet Take 75 mg by mouth every evening.   Marland Kitchen dexlansoprazole (DEXILANT) 60 MG capsule Take 1 tab by mouth every morning. (Patient taking differently: Take 60 mg by mouth every evening. )  . DULoxetine (CYMBALTA) 20 MG capsule Take 1 capsule (20 mg total) by mouth daily for 30 days, THEN 2 capsules (40 mg total) daily.  Marland Kitchen EPINEPHrine (EPIPEN  2-PAK) 0.3 mg/0.3 mL IJ SOAJ injection Inject 0.3 mg into the muscle once as needed (for allergic reaction).   . gabapentin (NEURONTIN) 600 MG tablet Take 600 mg by mouth 4 (four) times daily.  . metFORMIN (GLUCOPHAGE) 500 MG tablet Take 250 mg by mouth daily.  Marland Kitchen rOPINIRole (REQUIP) 4 MG tablet Take 4 mg by mouth at bedtime.  Marland Kitchen tiZANidine (ZANAFLEX) 4 MG tablet Take 0.5-1 tablets (2-4 mg total) by mouth at bedtime as needed for muscle spasms.  Marland Kitchen zolpidem (AMBIEN CR) 12.5 MG CR tablet Take 12.5 mg by mouth at bedtime.     Allergies:   Other, Penicillins, Zithromax [azithromycin], Aspirin, Pineapple, Strawberry extract, Spironolactone, Aspartame and phenylalanine, Mushroom  extract complex, and Nicardipine   Social History   Tobacco Use  . Smoking status: Current Every Day Smoker    Packs/day: 1.00    Years: 45.00    Pack years: 45.00    Types: Cigarettes    Start date: 03/04/1971  . Smokeless tobacco: Never Used  Substance Use Topics  . Alcohol use: No    Alcohol/week: 0.0 standard drinks  . Drug use: No     Family Hx: The patient's family history includes Allergic rhinitis in her daughter and father; Asthma in her daughter; Bipolar disorder in her brother and daughter; Cancer in her father; Coronary artery disease in her father; Depression in her mother; Diabetes in her maternal grandmother; Drug abuse in her brother; Emphysema in her father; Heart attack in her brother and sister; Heart attack (age of onset: 39) in her father; Leukemia in her brother; Liver disease in her cousin; Skin cancer in her mother; Stomach cancer in her maternal grandfather and another family member; Stroke in her father; Urticaria in her daughter. There is no history of Angioedema, Atopy, Eczema, Immunodeficiency, Breast cancer, or Colon cancer.  ROS:   Please see the history of present illness.     All other systems reviewed and are negative.   Prior CV studies:   The following studies were reviewed  today:  Echocardiogram: 05/2017 Study Conclusions  - Left ventricle: The cavity size was normal. Systolic function was vigorous. The estimated ejection fraction was in the range of 65% to 70%. Wall motion was normal; there were no regional wall motion abnormalities. Doppler parameters are consistent with abnormal left ventricular relaxation (grade 1 diastolic dysfunction). Doppler parameters are consistent with high ventricular filling pressure. Mild concentric and mild to moderate focal basal septal hypertrophy.   Coronary CT: 03/22/2018 Coronary Arteries: Right dominant with no anomalies  LM: No plaque or stenosis.  LAD system: No plaque or stenosis.  Circumflex system: No plaque or stenosis.  RCA system: Small area of calcified plaque mid RCA, no significant stenosis.  IMPRESSION: 1. Coronary artery calcium [name] 3.7 Agatston units, placing the patient in the 74th percentile for age and gender, suggesting intermediate risk for future cardiac events.  2. No obstructive coronary disease  Labs/Other Tests and Data Reviewed:    EKG:  No ECG reviewed.  Recent Labs: 06/19/2018: ALT 15 08/11/2018: B Natriuretic Peptide 44.0 11/27/2018: BUN 22; Creatinine, Ser 0.79; Hemoglobin 13.7; Platelets 259; Potassium 3.9; Sodium 143   Recent Lipid Panel Lab Results  Component Value Date/Time   CHOL 140 08/21/2017 05:25 AM   TRIG 142 08/21/2017 05:25 AM   HDL 30 (L) 08/21/2017 05:25 AM   CHOLHDL 4.7 08/21/2017 05:25 AM   LDLCALC 82 08/21/2017 05:25 AM    Wt Readings from Last 3 Encounters:  05/11/19 220 lb (99.8 kg)  05/09/19 218 lb (98.9 kg)  11/27/18 230 lb (104.3 kg)     Objective:    Vital Signs:  BP (!) 218/112   Pulse 81   Ht 5\' 7"  (1.702 m)   Wt 220 lb (99.8 kg)   BMI 34.46 kg/m    VITAL SIGNS:  reviewed  ASSESSMENT & PLAN:     1. Accelerated hypertension with endorgan damage (history of stroke): Blood pressure  issignificantlyelevated. She was hospitalized inthe fall 2019hypertensive emergency due to medication and dietary noncompliance. She alwaysclaims to be compliant with medications.Psychiatry was consulted during hospitalization in the fall 2019 due to her noncooperative behavior and she was felt to be at high  risk for readmission because of adjustment disorder with emotional disturbance and medical noncompliance. She said she had a TIA in October in [place]. She is currently taking 10 mg daily, labetalol 200 mg once daily, and clonidine 0.1 mg twice daily. She is no longer on hydralazine. I will increase labetalol to 200 mg bid and start hydralazine 100 mg bid.  2. History of CVA: She is on Plavix.Blood pressure is markedly elevated. She said she had a TIA in October in [place]. I will increase labetalol to 200 mg bid and start hydralazine 100 mg bid.    COVID-19 Education: The signs and symptoms of COVID-19 were discussed with the patient and how to seek care for testing (follow up with PCP or arrange E-visit).  The importance of social distancing was discussed today.  Time:   Today, I have spent 15 minutes with the patient with telehealth technology discussing the above problems.     Medication Adjustments/Labs and Tests Ordered: Current medicines are reviewed at length with the patient today.  Concerns regarding medicines are outlined above.   Tests Ordered: No orders of the defined types were placed in this encounter.   Medication Changes: No orders of the defined types were placed in this encounter.   Follow Up:  Virtual Visit  in 6 month(s)  Signed, Kate Sable, MD  05/11/2019 10:29 AM    [facility]

## 2019-05-16 ENCOUNTER — Telehealth: Payer: Self-pay | Admitting: *Deleted

## 2019-05-16 DIAGNOSIS — G894 Chronic pain syndrome: Secondary | ICD-10-CM

## 2019-05-16 MED ORDER — DIAZEPAM 5 MG PO TABS: 5.0000 mg | ORAL_TABLET | Freq: Once | ORAL | 0 refills | Status: AC

## 2019-05-16 NOTE — Telephone Encounter (Signed)
Notified. 

## 2019-05-16 NOTE — Telephone Encounter (Signed)
Dr Holley Raring, The patient is requesting medication prior to the MRI.

## 2019-05-16 NOTE — Telephone Encounter (Signed)
Prescription for Valium, 5 mg p.o. to be taken prior to MRI sent electronically.

## 2019-05-16 NOTE — Telephone Encounter (Signed)
LM for patient to call office

## 2019-05-28 ENCOUNTER — Other Ambulatory Visit: Payer: Self-pay

## 2019-05-28 ENCOUNTER — Ambulatory Visit
Admission: RE | Admit: 2019-05-28 | Discharge: 2019-05-28 | Disposition: A | Discharge status: Home / Self Care | Payer: Medicare Other | Source: Ambulatory Visit | Attending: Student in an Organized Health Care Education/Training Program | Admitting: Student in an Organized Health Care Education/Training Program

## 2019-05-28 DIAGNOSIS — M5416 Radiculopathy, lumbar region: Secondary | ICD-10-CM

## 2019-05-28 DIAGNOSIS — G8929 Other chronic pain: Secondary | ICD-10-CM

## 2019-05-28 DIAGNOSIS — M48061 Spinal stenosis, lumbar region without neurogenic claudication: Secondary | ICD-10-CM

## 2019-05-28 DIAGNOSIS — Z981 Arthrodesis status: Secondary | ICD-10-CM

## 2019-05-28 DIAGNOSIS — M9973 Connective tissue and disc stenosis of intervertebral foramina of lumbar region: Secondary | ICD-10-CM | POA: Insufficient documentation

## 2019-05-29 IMAGING — US US FNA BIOPSY THYROID 1ST LESION
1 series · 11 of 11 positions shown · non-contrast
Comparison: 03/16/2017

INDICATION: Indeterminate thyroid nodule, inferior isthmus

EXAM:
ULTRASOUND GUIDED FINE NEEDLE ASPIRATION OF INDETERMINATE THYROID
NODULE
TECHNIQUE: Informed written consent was obtained from the patient after a
discussion of the risks, benefits and alternatives to treatment.
Questions regarding the procedure were encouraged and answered. A
timeout was performed prior to the initiation of the procedure.

[Series 1: us fna biopsy thyroid 1st lesion · 0.06mm/px · 11 acquisitions, 11 frames shown]
[im 1/11]
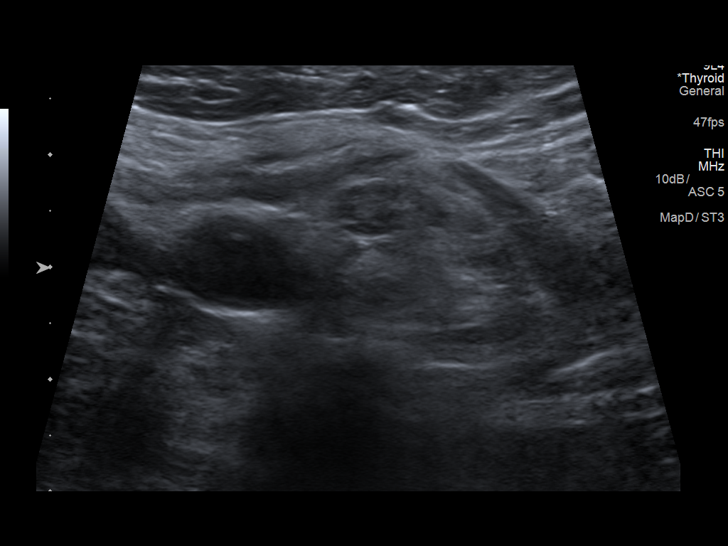
[im 2/11]
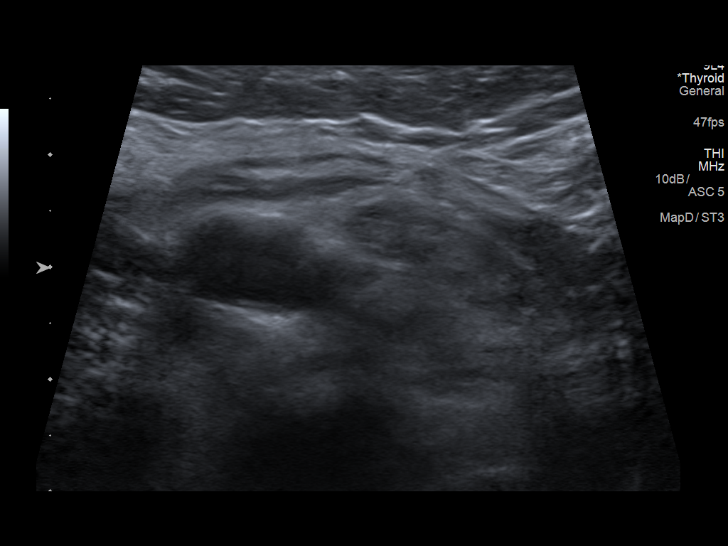
[im 3/11]
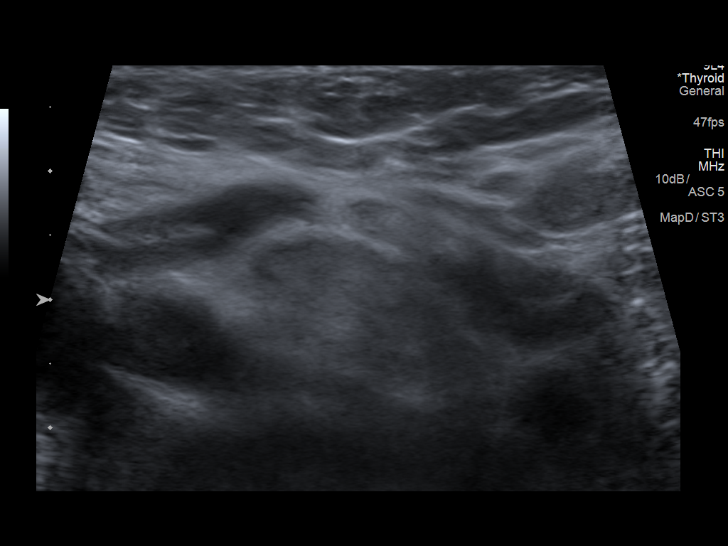
[im 4/11]
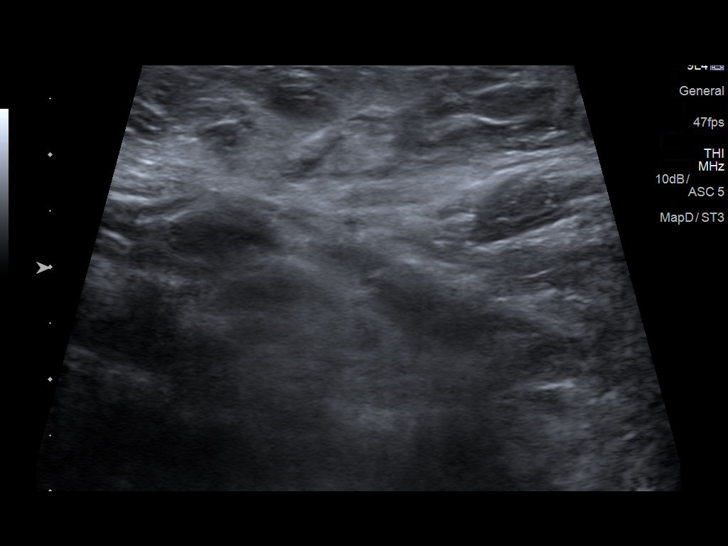
[im 5/11]
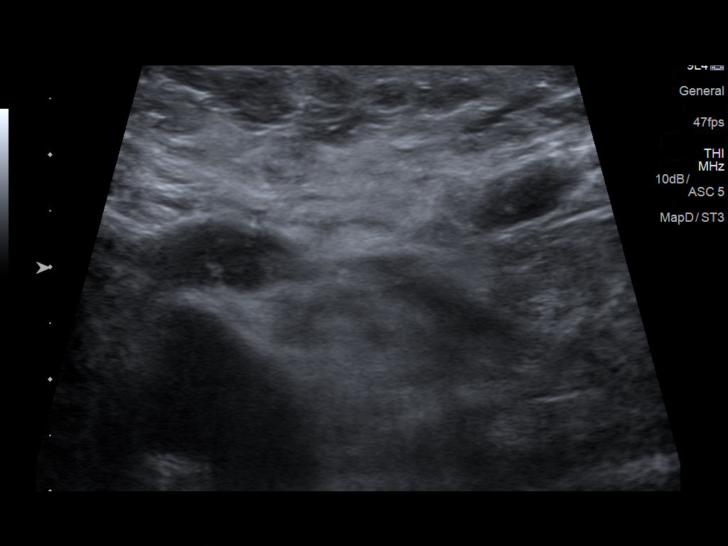
[im 6/11]
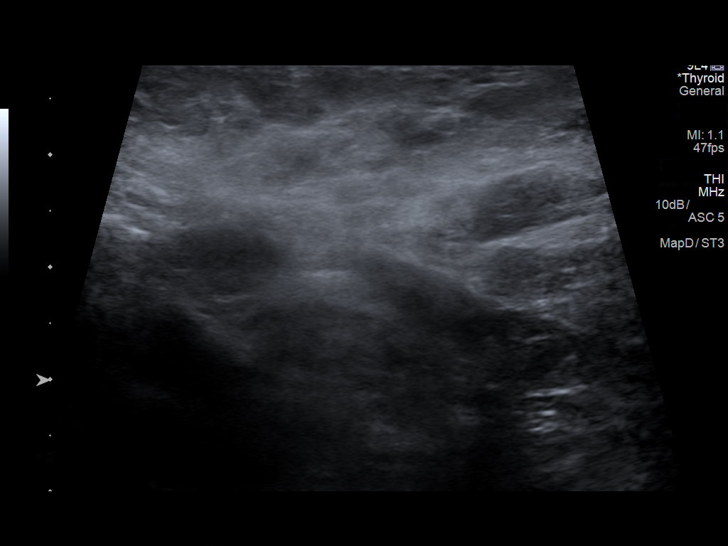
[im 7/11]
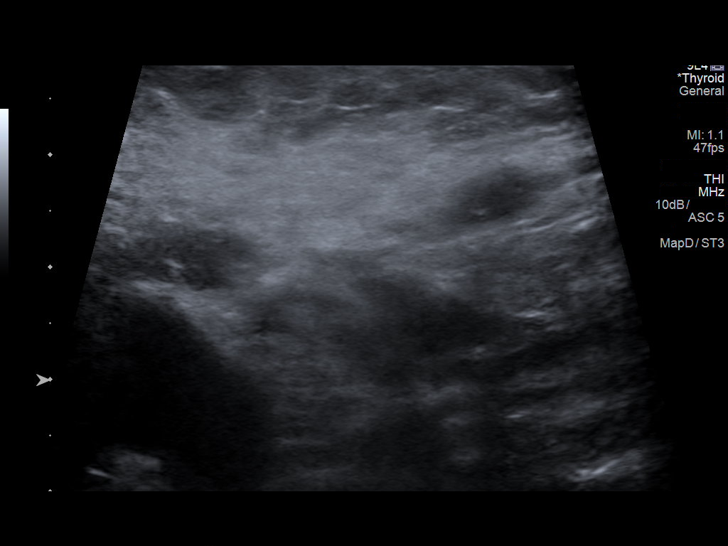
[im 8/11]
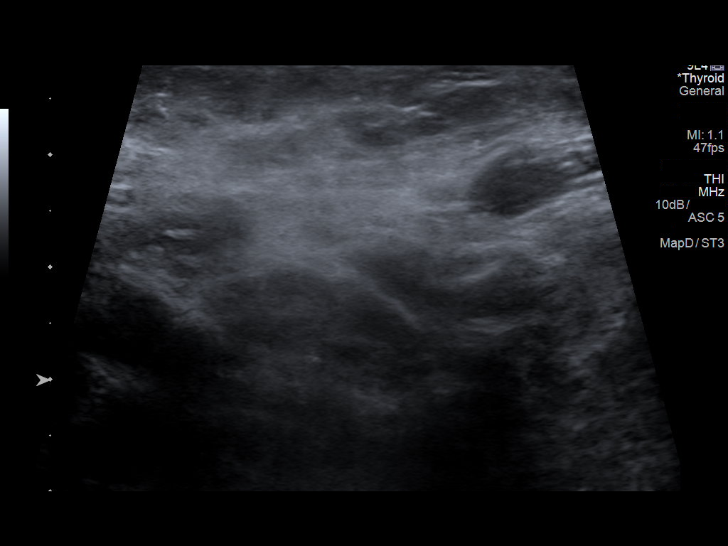
[im 9/11]
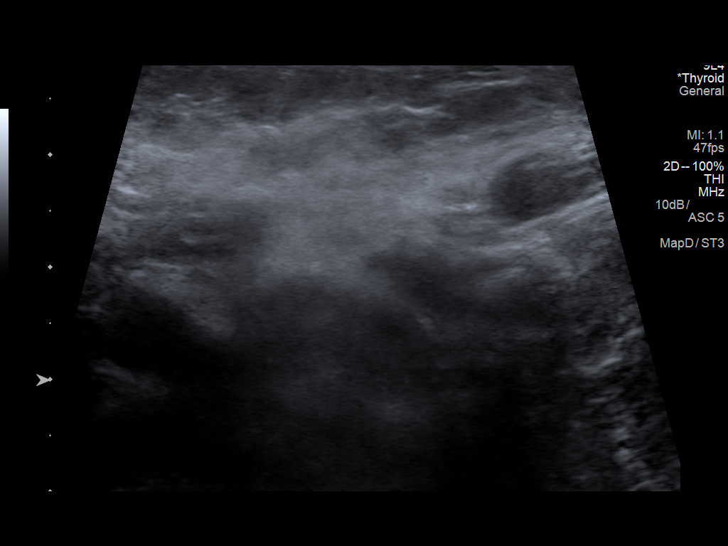
[im 10/11]
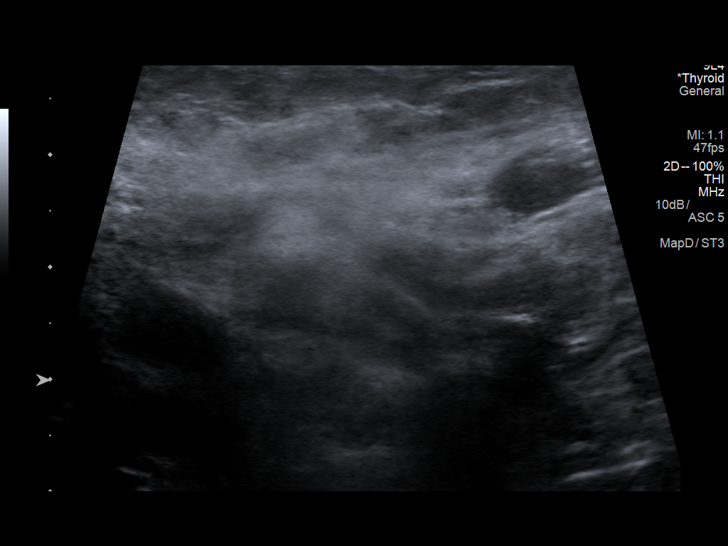
[im 11/11]
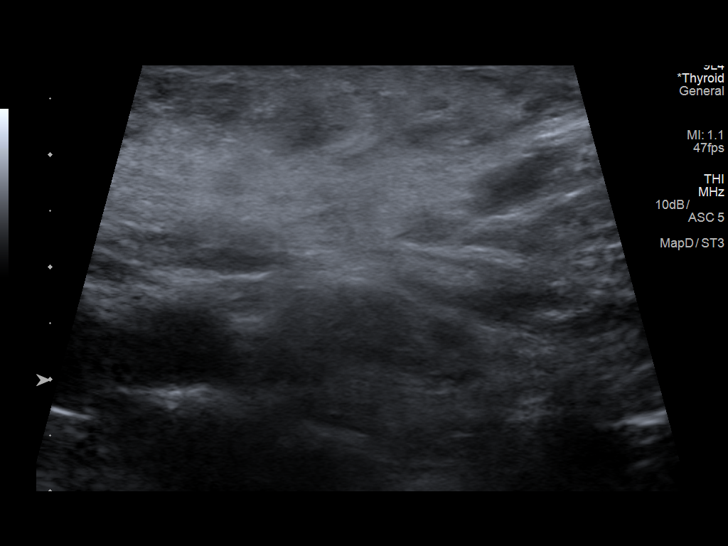

[11 of 11 positions shown; findings below may reference images not displayed]

MEDICATIONS:
Intravenous Fentanyl 300 mcg and Versed 6 mg were administered as
conscious sedation during continuous monitoring of the patient's
level of consciousness and physiological / cardiorespiratory status
by the radiology RN, with a total moderate sedation time of 18
minutes.

COMPLICATIONS:
None immediate.
Pre-procedural ultrasound scanning demonstrated unchanged size and
appearance of the indeterminate nodule within the inferior isthmus

The procedure was planned. The neck was prepped in the usual sterile
fashion, and a sterile drape was applied covering the operative
field. A timeout was performed prior to the initiation of the
procedure. Moderate sedation was initiated. Local anesthesia was
provided with 1% lidocaine.

Under direct ultrasound guidance, 5 FNA biopsies were performed of
the inferior isthmic lesion with a 25 gauge needle. Multiple
ultrasound images were saved for procedural documentation purposes.
The samples were prepared and submitted to pathology, and saved for
Afirma.

Limited post procedural scanning was negative for hematoma or
additional complication. Dressings were placed. The patient
tolerated the above procedures procedure well without immediate
postprocedural complication.
FINDINGS: Nodule reference number based on prior diagnostic ultrasound: 1

Maximum size: 1.7 cm

Location: Isthmus; Inferior inferior

ACR TI-RADS risk category: TR4 (4-6 points)

Reason for biopsy: meets ACR TI-RADS criteria

Ultrasound imaging confirms appropriate placement of the needles
within the thyroid nodule.
IMPRESSION: Technically successful ultrasound guided fine needle aspiration of
isthmic nodule, with moderate sedation.

## 2019-06-11 ENCOUNTER — Observation Stay (HOSPITAL_COMMUNITY)
Admission: EM | Admit: 2019-06-11 | Discharge: 2019-06-12 | Disposition: A | Discharge status: Home / Self Care | Payer: Medicare Other | Attending: Internal Medicine | Admitting: Internal Medicine

## 2019-06-11 ENCOUNTER — Encounter (HOSPITAL_COMMUNITY): Payer: Self-pay | Admitting: *Deleted

## 2019-06-11 ENCOUNTER — Emergency Department (HOSPITAL_COMMUNITY): Payer: Medicare Other

## 2019-06-11 ENCOUNTER — Other Ambulatory Visit: Payer: Self-pay

## 2019-06-11 DIAGNOSIS — F419 Anxiety disorder, unspecified: Secondary | ICD-10-CM | POA: Diagnosis not present

## 2019-06-11 DIAGNOSIS — H5462 Unqualified visual loss, left eye, normal vision right eye: Secondary | ICD-10-CM | POA: Insufficient documentation

## 2019-06-11 DIAGNOSIS — E785 Hyperlipidemia, unspecified: Secondary | ICD-10-CM | POA: Insufficient documentation

## 2019-06-11 DIAGNOSIS — G4700 Insomnia, unspecified: Secondary | ICD-10-CM | POA: Insufficient documentation

## 2019-06-11 DIAGNOSIS — F329 Major depressive disorder, single episode, unspecified: Secondary | ICD-10-CM | POA: Diagnosis not present

## 2019-06-11 DIAGNOSIS — R519 Headache, unspecified: Principal | ICD-10-CM | POA: Diagnosis present

## 2019-06-11 DIAGNOSIS — Z7902 Long term (current) use of antithrombotics/antiplatelets: Secondary | ICD-10-CM | POA: Diagnosis not present

## 2019-06-11 DIAGNOSIS — F411 Generalized anxiety disorder: Secondary | ICD-10-CM | POA: Diagnosis present

## 2019-06-11 DIAGNOSIS — Z8614 Personal history of Methicillin resistant Staphylococcus aureus infection: Secondary | ICD-10-CM | POA: Insufficient documentation

## 2019-06-11 DIAGNOSIS — I130 Hypertensive heart and chronic kidney disease with heart failure and stage 1 through stage 4 chronic kidney disease, or unspecified chronic kidney disease: Secondary | ICD-10-CM | POA: Insufficient documentation

## 2019-06-11 DIAGNOSIS — G441 Vascular headache, not elsewhere classified: Secondary | ICD-10-CM | POA: Diagnosis present

## 2019-06-11 DIAGNOSIS — Z20822 Contact with and (suspected) exposure to covid-19: Secondary | ICD-10-CM | POA: Diagnosis not present

## 2019-06-11 DIAGNOSIS — I69354 Hemiplegia and hemiparesis following cerebral infarction affecting left non-dominant side: Secondary | ICD-10-CM | POA: Diagnosis not present

## 2019-06-11 DIAGNOSIS — I679 Cerebrovascular disease, unspecified: Secondary | ICD-10-CM | POA: Diagnosis present

## 2019-06-11 DIAGNOSIS — I10 Essential (primary) hypertension: Secondary | ICD-10-CM | POA: Diagnosis present

## 2019-06-11 DIAGNOSIS — G35 Multiple sclerosis: Secondary | ICD-10-CM | POA: Diagnosis not present

## 2019-06-11 DIAGNOSIS — F17210 Nicotine dependence, cigarettes, uncomplicated: Secondary | ICD-10-CM | POA: Diagnosis not present

## 2019-06-11 DIAGNOSIS — G2581 Restless legs syndrome: Secondary | ICD-10-CM | POA: Diagnosis present

## 2019-06-11 DIAGNOSIS — E1122 Type 2 diabetes mellitus with diabetic chronic kidney disease: Secondary | ICD-10-CM | POA: Insufficient documentation

## 2019-06-11 DIAGNOSIS — R531 Weakness: Secondary | ICD-10-CM

## 2019-06-11 DIAGNOSIS — I2510 Atherosclerotic heart disease of native coronary artery without angina pectoris: Secondary | ICD-10-CM | POA: Diagnosis not present

## 2019-06-11 DIAGNOSIS — IMO0002 Reserved for concepts with insufficient information to code with codable children: Secondary | ICD-10-CM | POA: Diagnosis present

## 2019-06-11 DIAGNOSIS — I5030 Unspecified diastolic (congestive) heart failure: Secondary | ICD-10-CM | POA: Insufficient documentation

## 2019-06-11 DIAGNOSIS — J449 Chronic obstructive pulmonary disease, unspecified: Secondary | ICD-10-CM | POA: Diagnosis not present

## 2019-06-11 DIAGNOSIS — N189 Chronic kidney disease, unspecified: Secondary | ICD-10-CM | POA: Insufficient documentation

## 2019-06-11 DIAGNOSIS — R200 Anesthesia of skin: Secondary | ICD-10-CM | POA: Diagnosis not present

## 2019-06-11 DIAGNOSIS — K279 Peptic ulcer, site unspecified, unspecified as acute or chronic, without hemorrhage or perforation: Secondary | ICD-10-CM | POA: Diagnosis present

## 2019-06-11 DIAGNOSIS — Z79899 Other long term (current) drug therapy: Secondary | ICD-10-CM | POA: Insufficient documentation

## 2019-06-11 DIAGNOSIS — G629 Polyneuropathy, unspecified: Secondary | ICD-10-CM | POA: Insufficient documentation

## 2019-06-11 DIAGNOSIS — F4310 Post-traumatic stress disorder, unspecified: Secondary | ICD-10-CM | POA: Diagnosis not present

## 2019-06-11 DIAGNOSIS — I69398 Other sequelae of cerebral infarction: Secondary | ICD-10-CM | POA: Insufficient documentation

## 2019-06-11 DIAGNOSIS — K219 Gastro-esophageal reflux disease without esophagitis: Secondary | ICD-10-CM | POA: Diagnosis present

## 2019-06-11 DIAGNOSIS — E1140 Type 2 diabetes mellitus with diabetic neuropathy, unspecified: Secondary | ICD-10-CM | POA: Diagnosis present

## 2019-06-11 DIAGNOSIS — R42 Dizziness and giddiness: Secondary | ICD-10-CM | POA: Diagnosis present

## 2019-06-11 LAB — DIFFERENTIAL
Abs Immature Granulocytes: 0.02 10*3/uL (ref 0.00–0.07)
Basophils Absolute: 0.1 10*3/uL (ref 0.0–0.1)
Basophils Relative: 1 %
Eosinophils Absolute: 0.3 10*3/uL (ref 0.0–0.5)
Eosinophils Relative: 3 %
Immature Granulocytes: 0 %
Lymphocytes Relative: 53 %
Lymphs Abs: 4.7 10*3/uL — ABNORMAL HIGH (ref 0.7–4.0)
Monocytes Absolute: 0.7 10*3/uL (ref 0.1–1.0)
Monocytes Relative: 7 %
Neutro Abs: 3.2 10*3/uL (ref 1.7–7.7)
Neutrophils Relative %: 36 %

## 2019-06-11 LAB — CBC
HCT: 40.9 % (ref 36.0–46.0)
Hemoglobin: 13 g/dL (ref 12.0–15.0)
MCH: 29.1 pg (ref 26.0–34.0)
MCHC: 31.8 g/dL (ref 30.0–36.0)
MCV: 91.5 fL (ref 80.0–100.0)
Platelets: 292 10*3/uL (ref 150–400)
RBC: 4.47 MIL/uL (ref 3.87–5.11)
RDW: 14.4 % (ref 11.5–15.5)
WBC: 8.8 10*3/uL (ref 4.0–10.5)
nRBC: 0 % (ref 0.0–0.2)

## 2019-06-11 LAB — COMPREHENSIVE METABOLIC PANEL
ALT: 12 U/L (ref 0–44)
AST: 12 U/L — ABNORMAL LOW (ref 15–41)
Albumin: 3.5 g/dL (ref 3.5–5.0)
Alkaline Phosphatase: 72 U/L (ref 38–126)
Anion gap: 9 (ref 5–15)
BUN: 16 mg/dL (ref 6–20)
CO2: 23 mmol/L (ref 22–32)
Calcium: 8.7 mg/dL — ABNORMAL LOW (ref 8.9–10.3)
Chloride: 109 mmol/L (ref 98–111)
Creatinine, Ser: 0.78 mg/dL (ref 0.44–1.00)
GFR calc Af Amer: >60 mL/min (ref >60)
GFR calc non Af Amer: >60 mL/min (ref >60)
Glucose, Bld: 106 mg/dL — ABNORMAL HIGH (ref 70–99)
Potassium: 3.6 mmol/L (ref 3.5–5.1)
Sodium: 141 mmol/L (ref 135–145)
Total Bilirubin: 0.2 mg/dL — ABNORMAL LOW (ref 0.3–1.2)
Total Protein: 6.9 g/dL (ref 6.5–8.1)

## 2019-06-11 LAB — APTT: aPTT: 31 seconds (ref 24–36)

## 2019-06-11 LAB — PROTIME-INR
INR: 1.1 (ref 0.8–1.2)
Prothrombin Time: 13.6 seconds (ref 11.4–15.2)

## 2019-06-11 LAB — ETHANOL: Alcohol, Ethyl (B): <10 mg/dL (ref <10)

## 2019-06-11 MED ORDER — KETOROLAC TROMETHAMINE 30 MG/ML IJ SOLN
30.0000 mg | Freq: Once | INTRAMUSCULAR | Status: AC
  Administered 2019-06-11: 30 mg via INTRAVENOUS
  Filled 2019-06-11: qty 1

## 2019-06-11 MED ORDER — METOCLOPRAMIDE HCL 5 MG/ML IJ SOLN
10.0000 mg | Freq: Once | INTRAMUSCULAR | Status: AC
  Administered 2019-06-11: 10 mg via INTRAVENOUS
  Filled 2019-06-11: qty 2

## 2019-06-11 MED ORDER — DIPHENHYDRAMINE HCL 50 MG/ML IJ SOLN
25.0000 mg | Freq: Once | INTRAMUSCULAR | Status: DC
  Filled 2019-06-11: qty 1

## 2019-06-11 NOTE — Consult Note (Signed)
[facility] Tele[facility]urology Consult Services  Stat Consult  Date of Service:   06/11/2019 21:46:38  Impression:     .  R51 - HA (Headache)     .  G44.1 - Vascular headache, not elsewhere classified     .  I69.3 - Sequelae of cerebral infarction     .  I63.81 - Cerebrovascular accident (CVA) due to occulsion of small artery     .  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 Also discussed the case with the ED provider. Patient has multiple symptoms. She is complaining of some weakness on the left side, headache, numbness in both arms,, headache getting worse when she sneezes or coughs and worsening of headaches when bending forward. She also complains of some photophobia and phonophobia. Differential diagnosis of this is vast. It includes migraine headaches, analgesic rebound headaches due to Excedrin Migraine, increased intracranial pressure,rule out intracranial aneurysm, rule out intracranial thrombosis, venous, rule out myelopathy with neck pain, numbness in the arms and also a new stroke does come in differential. She is not a candidate for alteplase or NIR based on the duration of symptoms going on for a week or so with worsening today. I would suggest admitting him to the hospital and getting an MRI of the head, MRA of the head and MRV of the head. I would also do an MRI of the C-spine. I will treat her headache symptomatically and optimize the blood pressure control. If the headaches persist, I would also recommend considering a lumbar puncture with checking of opening pressure. She would need neurology follow-up. All this was discussed with the patient and all her questions and concerns were addressed.  CT HEAD: Showed No Acute Hemorrhage or Acute Core Infarct  Metrics: [facility] Notification Time: 06/11/2019 21:45:32 Stamp Time: 06/11/2019 21:46:38 Callback Response Time: 06/11/2019  21:48:39  Our recommendations are outlined below.  Recommendations:     .  Initiate Aspirin 325 MG Daily   Imaging Studies:     .  MRI Head     .  MRA Head Without Contrast  Therapies:     .  Physical Therapy, Occupational Therapy, Speech Therapy Assessment When Applicable  Disposition: Neurology Follow Up Recommended  Sign Out:     .  Discussed with Emergency Department Provider  ----------------------------------------------------------------------------------------------------  Chief Complaint: headache  History of Present Illness: Patient is a 59 year old Female.  Extremely pleasant 59 year old white female with past medical history of hypertension, history of a previous stroke, migraines, chronic back pain came to the hospital because of bad headache going on for past 1 week. Her blood pressure is markedly elevated but she reports normally uses more than 200 and she does not get these headaches with it. She reports the headache gets worse if she bends forward, coughs or sneezes. She has been having some numbness on the right side of the face and both arms since this morning when she woke up. Because of the symptoms she came to the hospital. She reports she is weak on the left side which she feels is the same side. She had a previous stroke and it appears weaker to the patient than before. Could not tell me exactly for how long this is going on but most of the symptoms have been going on for the past 1 week or so.    Past Medical History:     . Hypertension     .  Diabetes Mellitus     . Hyperlipidemia     . Coronary Artery Disease     . Stroke    Antiplatelet use: Plavix     Examination: BP(190/98), Pulse(88), Blood Glucose(106) 1A: Level of Consciousness - Alert; keenly responsive + 0 1B: Ask Month and Age - Both Questions Right + 0 1C: Blink Eyes & Squeeze Hands - Performs Both Tasks + 0 2: Test Horizontal Extraocular Movements - Normal + 0 3: Test Visual Fields  - No Visual Loss + 0 4: Test Facial Palsy (Use Grimace if Obtunded) - Minor paralysis (flat nasolabial fold, smile asymmetry) + 1 5A: Test Left Arm Motor Drift - Drift, but doesn't hit bed + 1 5B: Test Right Arm Motor Drift - No Drift for 10 Seconds + 0 6A: Test Left Leg Motor Drift - Drift, but doesn't hit bed + 1 6B: Test Right Leg Motor Drift - No Drift for 5 Seconds + 0 7: Test Limb Ataxia (FNF/Heel-Shin) - No Ataxia + 0 8: Test Sensation - Normal; No sensory loss + 0 9: Test Language/Aphasia - Normal; No aphasia + 0 10: Test Dysarthria - Normal + 0 11: Test Extinction/Inattention - No abnormality + 0  NIHSS Score: 3  Patient/Family was informed the Neurology Consult would happen via TeleHealth consult by way of interactive audio and video telecommunications and consented to receiving care in this manner.  Due to the immediate potential for life-threatening deterioration due to underlying acute neurologic illness, I spent 30 minutes providing critical care. This time includes time for face to face visit via telemedicine, review of medical records, imaging studies and discussion of findings with providers, the patient and/or family.   Dr Faustino Congress   [facility] [phone]  Case [id number]

## 2019-06-11 NOTE — ED Triage Notes (Signed)
Pt c/o headache for a week, dizziness that started this am, worse with bending over, bilateral numbness to arm and legs that pt states she woke up with. Cough for 2 days that is non productive, chills. Pt ambulatory to bed for triage

## 2019-06-11 NOTE — H&P (Signed)
History and Physical    Patient Demographics:    Katrina [name] [id number] DOB: [birth year] DOA: 06/11/2019  PCP: [name], FNP  Patient coming from: Home  I have personally briefly reviewed patient's old medical records in [place]  Chief Complaint: Headache, numbness   Assessment & Plan:     Assessment/Plan Principal Problem:   Cerebrovascular disease Active Problems:   Moderate COPD (chronic obstructive pulmonary disease) (HCC)   GERD (gastroesophageal reflux disease)   Uncontrolled hypertension   PUD (peptic ulcer disease)   Type 2 diabetes, uncontrolled, with neuropathy (HCC)   Left-sided weakness   Vascular headache   Generalized anxiety disorder   PTSD (post-traumatic stress disorder)   Type 2 diabetes mellitus with diabetic neuropathy (HCC)   Restless leg syndrome     Principal Problem: Headache with bilateral upper extremity numbness Patient presented with a 1 week history of right-sided headache.  Subsequently has right-sided facial numbness and left-sided upper and lower extremity numbness. Differential diagnosis include migraine headaches versus CVA versus cervical myelopathy versus venous thrombosis versus uncontrolled hypertension with hypertensive encephalopathy.  Seen by teleneurology who recommended admission for further work-up. -MRI brain, MRA head, MRV, MRI cervical spine as per neurology recommendations.  Patient does have significant claustrophobia and will require pre-medication -Continue Plavix -Neurochecks every 4 hours -PT evaluation -Neurology consult with Dr. Merlene Laughter has been ordered.  Will need to call in the morning.  Other Active Problems: History of CVA: Chronic mild left residual weakness Continue Plavix  Chronic congestive heart failure with preserved ejection fraction Currently euvolemic, not on diuretics  Hypertension: Continue amlodipine, labetalol, hydralazine  Hyperlipidemia: Continue  Crestor  Restless leg syndrome: Continue ropinirole  Peripheral neuropathy: Continue gabapentin  Depression/ Anxiety/ PTSD Continue Cymbalta, Ambien  DVT prophylaxis: Lovenox Code Status:  Full code Family Communication: N/A  Disposition Plan: Place in observation Consults called: N/A Admission status: Observation stay    HPI:     HPI: Katrina [name] is a 59 y.o. female with medical history significant of allergy, anemia, anxiety, osteoarthritis of the spine, COPD, chronic kidney disease, chronic lower back pain, claustrophobia, depression, insomnia, PTSD, restless leg syndrome, GERD, type 2 diabetes, lung nodules, tobacco use, history of multiple TIAs, medication and medical treatment noncompliance who presented to the ER with headache.  Patient states she has been having mainly right-sided dull headache starting about 1 week ago associated with mild photophobia.  She says today she started having right-sided facial numbness and then had bilateral upper extremity and lower extremity numbness and tingling, significantly more on the left upper and left lower extremity.  She has had mild nausea over the last couple of days and has not been able to eat anything.  She does report having chronic slight left sided weakness.  She has had no difficulty with ambulation, swallowing, blurring of vision, dizziness, lightheadedness, palpitations, chest pain, shortness of breath, dysuria.  She claims compliance to all her medications. ED Course:  Vital Signs reviewed on presentation, significant for patient is afebrile, heart rate 74, blood pressure 190/112, saturation 96% on room air. Labs reviewed, significant for sodium 141, potassium 3.6, bicarb 23, BUN 16, creatinine 0.78, LFTs within normal meds, WBC count 8.8, hemoglobin 13.0, hematocrit 40.9, platelets 292, PT, PTT within normal limits, SARS-CoV-2 RT-PCR is pending.  Alcohol level is negative. Imaging personally Reviewed, CT of the head shows  no acute intracranial abnormalities.  Chest x-ray shows no acute cardiopulmonary disease. EKG personally reviewed, shows sinus rhythm, no acute ST-T  changes.    Review of systems:    Review of Systems: As per HPI otherwise 10 point review of systems negative.  All other review of systems is negative except the ones noted above in the HPI.    Past Medical and Surgical History:  Reviewed by me  Past Medical History:  Diagnosis Date  . Allergy   . Anemia 1975-1976   . Anxiety    takes Valium daily as needed  . Arthritis    "spine" (12/03/2016)  . Asthma    has inhalers but doesn't use (12/03/2016)  . Chronic bronchitis ([place])    "get it a couple times q yr" (12/03/2016)  . Chronic kidney disease   . Chronic lower back pain    budlging disc   . Claustrophobia   . COPD (chronic obstructive pulmonary disease) ([place])   . Daily headache   . Depression   . Diverticulitis   . Family history of adverse reaction to anesthesia    2 daughters gets extremely sick   . GERD (gastroesophageal reflux disease)    takes Dexilant daily  . Heart murmur   . History of blood transfusion 1975-1976 "several"   no abnormal reaction noted  . History of colitis   . History of colon polyps    benign  . History of gastric ulcer   . History of hiatal hernia    "small one"  . History of MRSA infection 2017  . Hyperlipidemia    was on meds but has been off over a yr  . Hypertension    takes Amlodipine and Maxzide daily  . Insomnia    takes Ambien nightly  . Iron deficiency anemia    "when I was young"  . Lung nodules   . Migraine    "2-3/wk" (12/03/2016)  . MS (multiple sclerosis) ([place])    questionable per pt  . Noncompliance   . Osteoporosis   . Peripheral neuropathy    weakness,numbness,and tingling. Takes Gabapentin daily  . PTSD (post-traumatic stress disorder) dx'd 2016/2017   "was dx'd w/bipolar in 1991; replaced w/PTSD dx 2016/2017" (12/03/2016)  . Restless leg syndrome    takes  Requip daily  . 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 Type II diabetes mellitus ([place])    "went off insulin in 2012/2013" (12/03/2016)    Past Surgical History:  Procedure Laterality Date  . ABDOMINAL AORTOGRAM N/A 12/03/2016   Procedure: Abdominal Aortogram;  Surgeon: Angelia Mould, MD;  Location: [facility];  Service: Cardiovascular;  Laterality: N/A;  . ABDOMINAL HYSTERECTOMY  12/1987  . ADENOIDECTOMY  1975  . ANKLE SURGERY Bilateral 1993; 1995 X2   "stabilzation done; 1 on the right; 2 on the left"  . APPENDECTOMY  1989  . BREAST EXCISIONAL BIOPSY Left   . BREAST LUMPECTOMY Left    "benign tumor"  . CARDIAC CATHETERIZATION N/A 01/31/2015   Procedure: Left Heart Cath and Coronary Angiography;  Surgeon: Burnell Blanks, MD;  Location: [facility];  Service: Cardiovascular;  Laterality: N/A;  . COLONOSCOPY    . DILATION AND CURETTAGE OF UTERUS    . ESOPHAGOGASTRODUODENOSCOPY    . LAPAROSCOPIC CHOLECYSTECTOMY  1996  . NASAL RECONSTRUCTION  1976  . NASAL SINUS SURGERY  1975  . POSTERIOR LUMBAR FUSION  2005  . RADIOLOGY WITH ANESTHESIA N/A 01/15/2016   Procedure: MRI LUMBAR SPINE WITHOUT;  Surgeon: Medication Radiologist,  MD;  Location: [facility];  Service: Radiology;  Laterality: N/A;  . RADIOLOGY WITH ANESTHESIA N/A 06/29/2016   Procedure: MRI OF THE BRAIN WITH AND WITHOUT;  Surgeon: Medication Radiologist, MD;  Location: [facility];  Service: Radiology;  Laterality: N/A;  . RENAL ANGIOGRAPHY N/A 12/03/2016   Procedure: Renal Angiography;  Surgeon: Angelia Mould, MD;  Location: [facility];  Service: Cardiovascular;  Laterality: N/A;  . TONSILLECTOMY  1992  . TUBAL LIGATION    . TUMOR EXCISION  1998   from back of skull; developed;  MRSA from the area that had to be packed     Social History:  Reviewed by me   reports that she has been smoking cigarettes. She  started smoking about 48 years ago. She has a 45.00 pack-year smoking history. She has never used smokeless tobacco. She reports that she does not drink alcohol or use drugs.  Allergies:    Allergies  Allergen Reactions  . Cymbalta [Duloxetine Hcl] Shortness Of Breath  . Other Anaphylaxis and Swelling    [place]  . Penicillins Anaphylaxis    Has patient had a PCN reaction causing immediate rash, facial/tongue/throat swelling, SOB or lightheadedness with hypotension: Yes Has patient had a PCN reaction causing severe rash involving mucus membranes or skin necrosis: Yes Has patient had a PCN reaction that required hospitalization Yes Has patient had a PCN reaction occurring within the last 10 years: No If all of the above answers are "NO", then may proceed with Cephalosporin use.   . Zithromax [Azithromycin] Anaphylaxis  . Aspirin Other (See Comments)    Due to stomach ulcers.   . Pineapple Rash  . Strawberry Extract Rash and Hives  . Spironolactone Other (See Comments)    Dehydration per patient's report  . Aspartame And Phenylalanine Palpitations  . Mushroom Extract Complex Rash  . Nicardipine Nausea And Vomiting and Other (See Comments)    shaking    Family History :   Family History  Problem Relation Age of Onset  . Coronary artery disease Father   . Emphysema Father   . Heart attack Father 36       Died age 45  . Stroke Father   . Cancer Father        Unsure of type   . Allergic rhinitis Father   . Depression Mother   . Skin cancer Mother   . Bipolar disorder Brother   . Drug abuse Brother   . Leukemia Brother   . Bipolar disorder Daughter   . Allergic rhinitis Daughter   . Asthma Daughter   . Urticaria Daughter   . Diabetes Maternal Grandmother   . Stomach cancer Maternal Grandfather   . Heart attack Sister        Died in 2s  . Heart attack Brother        Died age 71  . Stomach cancer Other   . Liver disease Cousin   . Angioedema Neg Hx   . Atopy Neg Hx    . Eczema Neg Hx   . Immunodeficiency Neg Hx   . Breast cancer Neg Hx   . Colon cancer Neg Hx    Family history reviewed, noted as above, not pertinent to current presentation.   Home Medications:    Prior to Admission medications   Medication Sig Start Date End Date Taking? Authorizing Provider  amLODipine (NORVASC) 10 MG tablet Take 1 tablet (10 mg total) by mouth daily. Patient taking differently: Take 10 mg  by mouth every morning.  06/05/14  Yes Herminio Commons, MD  aspirin-acetaminophen-caffeine (EXCEDRIN EXTRA STRENGTH) [id number] MG tablet Take 2 tablets by mouth daily as needed for headache or migraine.    Yes [provider]  clopidogrel (PLAVIX) 75 MG tablet Take 75 mg by mouth every morning.    Yes [provider]  dexlansoprazole (DEXILANT) 60 MG capsule Take 1 tab by mouth every morning. Patient taking differently: Take 60 mg by mouth every morning.  10/18/16  Yes Esterwood, Amy S, PA-C  EPINEPHrine (EPIPEN 2-PAK) 0.3 mg/0.3 mL IJ SOAJ injection Inject 0.3 mg into the muscle once as needed (for allergic reaction).    Yes [provider]  gabapentin (NEURONTIN) 600 MG tablet Take 600 mg by mouth 4 (four) times daily.   Yes [provider]  hydrALAZINE (APRESOLINE) 100 MG tablet Take 1 tablet (100 mg total) by mouth 2 (two) times daily. 05/11/19  Yes Herminio Commons, MD  labetalol (NORMODYNE) 200 MG tablet Take 1 tablet (200 mg total) by mouth 2 (two) times daily. 05/11/19  Yes Herminio Commons, MD  metFORMIN (GLUCOPHAGE) 500 MG tablet Take 250 mg by mouth daily. 05/18/18  Yes [provider]  montelukast (SINGULAIR) 10 MG tablet Take 10 mg by mouth at bedtime.   Yes [provider]  rOPINIRole (REQUIP) 4 MG tablet Take 4 mg by mouth at bedtime.   Yes [provider]  rosuvastatin (CRESTOR) 5 MG tablet Take 5 mg by mouth every morning.    Yes [provider]  tiZANidine (ZANAFLEX) 4 MG tablet Take  0.5-1 tablets (2-4 mg total) by mouth at bedtime as needed for muscle spasms. 05/10/19 05/09/20 Yes Gillis Santa, MD  zolpidem (AMBIEN CR) 12.5 MG CR tablet Take 12.5 mg by mouth at bedtime. 05/16/17  Yes [provider]  DULoxetine (CYMBALTA) 20 MG capsule Take 1 capsule (20 mg total) by mouth daily for 30 days, THEN 2 capsules (40 mg total) daily. Patient not taking: Reported on 06/11/2019 05/10/19 08/08/19  Gillis Santa, MD    Physical Exam:    Physical Exam: Vitals:   06/11/19 1955 06/11/19 1956 06/11/19 2130 06/11/19 2145  BP: (!) 190/98  (!) 188/84   Pulse: 88  78   Resp: 20  (!) 27 15  Temp: 98.2 F (36.8 C)     TempSrc: Oral     SpO2: 100%  99%   Weight:  97.5 kg    Height:  5\' 7"  (1.702 m)      Constitutional: NAD, calm, comfortable Vitals:   06/11/19 1955 06/11/19 1956 06/11/19 2130 06/11/19 2145  BP: (!) 190/98  (!) 188/84   Pulse: 88  78   Resp: 20  (!) 27 15  Temp: 98.2 F (36.8 C)     TempSrc: Oral     SpO2: 100%  99%   Weight:  97.5 kg    Height:  5\' 7"  (1.702 m)     Eyes: PERRL, lids and conjunctivae normal ENMT: Mucous membranes are moist. Posterior pharynx clear of any exudate or lesions.Normal dentition.  Neck: normal, supple, no masses, no thyromegaly Respiratory: clear to auscultation bilaterally, no wheezing, no crackles. Normal respiratory effort. No accessory muscle use.  Cardiovascular: Regular rate and rhythm, no murmurs / rubs / gallops. No extremity edema. 2+ pedal pulses. No carotid bruits.  Abdomen: no tenderness, no masses palpated. No hepatosplenomegaly. Bowel sounds positive.  Musculoskeletal: no clubbing / cyanosis. No joint deformity upper and lower extremities. Good  ROM, no contractures. Normal muscle tone.  Skin: no rashes, lesions, ulcers. No induration Neurologic: CN 2-12 grossly intact.  Decreased sensation on both left upper and left lower extremity to pain and touch, decreased sensations over the right side of the face, Strength  slightly decreased in left upper extremity, Psychiatric: Normal judgment and insight. Alert and oriented x 3. Normal mood.    Decubitus Ulcers: Not present on admission Catheters and tubes: None  Data Review:    Labs on Admission: I have personally reviewed following labs and imaging studies  CBC: Recent Labs  Lab 06/11/19 2017  WBC 8.8  NEUTROABS 3.2  HGB 13.0  HCT 40.9  MCV 91.5  PLT 123456   Basic Metabolic Panel: Recent Labs  Lab 06/11/19 2017  NA 141  K 3.6  CL 109  CO2 23  GLUCOSE 106*  BUN 16  CREATININE 0.78  CALCIUM 8.7*   GFR: Estimated Creatinine Clearance: 92 mL/min (by C-G formula based on SCr of 0.78 mg/dL). Liver Function Tests: Recent Labs  Lab 06/11/19 2017  AST 12*  ALT 12  ALKPHOS 72  BILITOT 0.2*  PROT 6.9  ALBUMIN 3.5   No results for input(s): LIPASE, AMYLASE in the last 168 hours. No results for input(s): AMMONIA in the last 168 hours. Coagulation Profile: Recent Labs  Lab 06/11/19 2017  INR 1.1   Cardiac Enzymes: No results for input(s): CKTOTAL, CKMB, CKMBINDEX, TROPONINI in the last 168 hours. BNP (last 3 results) No results for input(s): PROBNP in the last 8760 hours. HbA1C: No results for input(s): HGBA1C in the last 72 hours. CBG: No results for input(s): GLUCAP in the last 168 hours. Lipid Profile: No results for input(s): CHOL, HDL, LDLCALC, TRIG, CHOLHDL, LDLDIRECT in the last 72 hours. Thyroid Function Tests: No results for input(s): TSH, T4TOTAL, FREET4, T3FREE, THYROIDAB in the last 72 hours. Anemia Panel: No results for input(s): VITAMINB12, FOLATE, FERRITIN, TIBC, IRON, RETICCTPCT in the last 72 hours. Urine analysis:    Component Value Date/Time   COLORURINE STRAW (A) 08/11/2018 2108   APPEARANCEUR CLEAR 08/11/2018 2108   LABSPEC 1.012 08/11/2018 2108   PHURINE 7.0 08/11/2018 2108   GLUCOSEU NEGATIVE 08/11/2018 2108   HGBUR MODERATE (A) 08/11/2018 2108   BILIRUBINUR NEGATIVE 08/11/2018 2108   KETONESUR  NEGATIVE 08/11/2018 2108   PROTEINUR NEGATIVE 08/11/2018 2108   UROBILINOGEN 0.2 12/20/2017 1241   NITRITE NEGATIVE 08/11/2018 2108   LEUKOCYTESUR NEGATIVE 08/11/2018 2108     Imaging Results:      Radiological Exams on Admission: CT Head Wo Contrast  Result Date: 06/11/2019 CLINICAL DATA:  59 year old female with TIA. EXAM: CT HEAD WITHOUT CONTRAST TECHNIQUE: Contiguous axial images were obtained from the base of the skull through the vertex without intravenous contrast. COMPARISON:  Head CT dated 06/19/2018. FINDINGS: Brain: The ventricles and sulci appropriate size for patient's age. The gray-white matter discrimination is preserved. There is no acute intracranial hemorrhage. No mass effect or midline shift no extra-axial fluid collection. Vascular: No hyperdense vessel or unexpected calcification. Skull: Normal. Negative for fracture or focal lesion. Sinuses/Orbits: Opacification of several ethmoid air cells. No air-fluid level. The remainder of the visualized paranasal sinuses and mastoid air cells are clear. Other: None IMPRESSION: Unremarkable noncontrast CT of the brain. Electronically Signed   By: Anner Crete M.D.   On: 06/11/2019 20:57   DG Chest Portable 1 View  Result Date: 06/11/2019 CLINICAL DATA:  Nonproductive cough. Chills. Shortness of breath. Headache. Chest pain. EXAM: PORTABLE CHEST 1  VIEW COMPARISON:  Chest x-ray 11/27/2018 and chest CT dated 03/22/2018 FINDINGS: The heart size and pulmonary vascularity are normal and the lungs are clear. Slight prominence of the left pericardial fat pad, unchanged. No significant bone abnormality. IMPRESSION: No active disease. Electronically Signed   By: Lorriane Shire M.D.   On: 06/11/2019 20:50      Anokhi Shannon Ginette Otto MD [facility]  If 7PM-7AM, please contact night-coverage   06/11/2019, 11:34 PM

## 2019-06-11 NOTE — ED Provider Notes (Signed)
[facility] EMERGENCY DEPARTMENT Provider Note   CSN: [id number] Arrival date & time: 06/11/19  1935     History Chief Complaint  Patient presents with  . Dizziness    Katrina [name] is a 59 y.o. female.  HPI      Katrina [name] is a 59 y.o. female with a PMH of DM, HTN, COPD, and previous stroke and TIA, who presents to the Emergency Department complaining of frontal headache of gradual onset for one week.  Describes the headache as throbbing sensation of forehead and top of her head.  Headache worsens with bending over.  No visual changes.  Yesterday, she developed pain and needles sensation to both hands and feet.  Facial numbness since earlier today and pain to her left shoulder that radiates to her left elbow.  Mild dizziness with certain movements.  She denies difficulty with speech or thoughts.  She also notes a mostly non-productive cough for 1-2 days.  Cough is not associated with fever or shortness of breath.  She reports having COVID in September, but was exposed to family members during the holiday that currently having symptoms, but have not been tested.       Past Medical History:  Diagnosis Date  . Allergy   . Anemia 1975-1976   . Anxiety    takes Valium daily as needed  . Arthritis    "spine" (12/03/2016)  . Asthma    has inhalers but doesn't use (12/03/2016)  . Chronic bronchitis ([place])    "get it a couple times q yr" (12/03/2016)  . Chronic kidney disease   . Chronic lower back pain    budlging disc   . Claustrophobia   . COPD (chronic obstructive pulmonary disease) ([place])   . Daily headache   . Depression   . Diverticulitis   . Family history of adverse reaction to anesthesia    2 daughters gets extremely sick   . GERD (gastroesophageal reflux disease)    takes Dexilant daily  . Heart murmur   . History of blood transfusion 1975-1976 "several"   no abnormal reaction noted  . History of colitis   . History of colon polyps    benign  . History of  gastric ulcer   . History of hiatal hernia    "small one"  . History of MRSA infection 2017  . Hyperlipidemia    was on meds but has been off over a yr  . Hypertension    takes Amlodipine and Maxzide daily  . Insomnia    takes Ambien nightly  . Iron deficiency anemia    "when I was young"  . Lung nodules   . Migraine    "2-3/wk" (12/03/2016)  . MS (multiple sclerosis) ([place])    questionable per pt  . Noncompliance   . Osteoporosis   . Peripheral neuropathy    weakness,numbness,and tingling. Takes Gabapentin daily  . PTSD (post-traumatic stress disorder) dx'd 2016/2017   "was dx'd w/bipolar in 1991; replaced w/PTSD dx 2016/2017" (12/03/2016)  . Restless leg syndrome    takes Requip daily  . 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 Type II diabetes mellitus ([place])    "went off insulin in 2012/2013" (12/03/2016)    Patient Active Problem List   Diagnosis Date Noted  . Lumbar radiculopathy 05/10/2019  . Chronic radicular lumbar pain 05/10/2019  . History of lumbar fusion  (L5-S1 2005)  05/10/2019  . Neuroforaminal stenosis of lumbar spine 05/10/2019  . Lumbar degenerative disc disease 05/10/2019  . 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 Impaired insight 02/08/2018  . Adjustment disorder with disturbance of emotion 02/08/2018  . Left lower quadrant abdominal pain affecting pregnancy 12/21/2017  . Chest pain in adult 08/20/2017  . Abnormal brain MRI 08/08/2017  . Weakness of left leg 05/19/2017  . Hypertension 12/03/2016  . Adverse food reaction 10/14/2016  . Pulmonary nodules 10/14/2016  . Non-seasonal allergic rhinitis due to fungal spores 10/14/2016  . Cigarette smoker 09/24/2016  . Cerebral microvascular disease 08/19/2016  . Non compliance with medical treatment 05/11/2016  . Leg pain, anterior, right 05/11/2016  . Syncope 05/02/2016  . Type 2 diabetes mellitus with  diabetic neuropathy ([place]) 05/02/2016  . Restless leg syndrome 05/02/2016  . History of CVA with residual deficit   . Vascular headache   . History of chest pain   . Generalized anxiety disorder   . Chronic bilateral low back pain without sciatica   . Peripheral neuropathy   . Migraine without status migrainosus, not intractable   . PTSD (post-traumatic stress disorder)   . History of cerebrovascular accident (CVA) with residual deficit   . CVA (cerebral vascular accident) ([place]) 04/03/2016  . Stroke (cerebrum) ([place]) 04/03/2016  . Post traumatic stress disorder 10/06/2015  . Precordial pain   . Migraine syndrome 01/24/2015  . Migraine 01/24/2015  . TIA (transient ischemic attack) 12/25/2014  . Hypertensive urgency   . Atypical chest pain   . Malignant hypertension   . Hypokalemia 04/16/2014  . Muscle weakness (generalized) 04/05/2014  . Stiffness of left hip joint 04/05/2014  . Pain in left hip 04/05/2014  . Diastolic dysfunction [id number]  . Normal coronary arteries 03/20/2014  . PUD (peptic ulcer disease) 03/20/2014  . Type 2 diabetes, uncontrolled, with neuropathy ([place]) 03/20/2014  . Morbid obesity due to excess calories ([place]) 03/20/2014  . Left-sided weakness 03/20/2014  . Paresthesias 03/20/2014  . Cerebrovascular disease 03/20/2014  . Dyspnea on exertion 03/05/2012  . Tobacco abuse 03/05/2012  . Moderate COPD (chronic obstructive pulmonary disease) ([place]) 11/23/2011  . GERD (gastroesophageal reflux disease) 11/23/2011  . Uncontrolled hypertension 11/23/2011    Past Surgical History:  Procedure Laterality Date  . ABDOMINAL AORTOGRAM N/A 12/03/2016   Procedure: Abdominal Aortogram;  Surgeon: Angelia Mould, MD;  Location: [place];  Service: Cardiovascular;  Laterality: N/A;  . ABDOMINAL HYSTERECTOMY  12/1987  . ADENOIDECTOMY  1975  . ANKLE SURGERY Bilateral 1993; 1995 X2   "stabilzation done; 1 on the right; 2 on the left"  . APPENDECTOMY  1989  .  BREAST EXCISIONAL BIOPSY Left   . BREAST LUMPECTOMY Left    "benign tumor"  . CARDIAC CATHETERIZATION N/A 01/31/2015   Procedure: Left Heart Cath and Coronary Angiography;  Surgeon: Burnell Blanks, MD;  Location: [place] [facility];  Service: Cardiovascular;  Laterality: N/A;  . COLONOSCOPY    . DILATION AND CURETTAGE OF UTERUS    . ESOPHAGOGASTRODUODENOSCOPY    . LAPAROSCOPIC CHOLECYSTECTOMY  1996  . NASAL RECONSTRUCTION  1976  . NASAL SINUS SURGERY  1975  . POSTERIOR LUMBAR FUSION  2005  . RADIOLOGY WITH ANESTHESIA N/A 01/15/2016   Procedure: MRI LUMBAR SPINE WITHOUT;  Surgeon: Medication Radiologist, MD;  Location: [facility];  Service: Radiology;  Laterality: N/A;  . RADIOLOGY WITH ANESTHESIA N/A 06/29/2016   Procedure: MRI OF THE BRAIN WITH AND WITHOUT;  Surgeon: Medication  Radiologist, MD;  Location: [facility];  Service: Radiology;  Laterality: N/A;  . RENAL ANGIOGRAPHY N/A 12/03/2016   Procedure: Renal Angiography;  Surgeon: Angelia Mould, MD;  Location: [place] [facility];  Service: Cardiovascular;  Laterality: N/A;  . TONSILLECTOMY  1992  . TUBAL LIGATION    . TUMOR EXCISION  1998   from back of skull; developed;  MRSA from the area that had to be packed     OB History   No obstetric history on file.     Family History  Problem Relation Age of Onset  . Coronary artery disease Father   . Emphysema Father   . Heart attack Father 27       Died age 73  . Stroke Father   . Cancer Father        Unsure of type   . Allergic rhinitis Father   . Depression Mother   . Skin cancer Mother   . Bipolar disorder Brother   . Drug abuse Brother   . Leukemia Brother   . Bipolar disorder Daughter   . Allergic rhinitis Daughter   . Asthma Daughter   . Urticaria Daughter   . Diabetes Maternal Grandmother   . Stomach cancer Maternal Grandfather   . Heart attack Sister        Died in 58s  . Heart attack Brother        Died age 42  . Stomach cancer Other   . Liver disease  Cousin   . Angioedema Neg Hx   . Atopy Neg Hx   . Eczema Neg Hx   . Immunodeficiency Neg Hx   . Breast cancer Neg Hx   . Colon cancer Neg Hx     Social History   Tobacco Use  . Smoking status: Current Every Day Smoker    Packs/day: 1.00    Years: 45.00    Pack years: 45.00    Types: Cigarettes    Start date: 03/04/1971  . Smokeless tobacco: Never Used  Substance Use Topics  . Alcohol use: No    Alcohol/week: 0.0 standard drinks  . Drug use: No    Home Medications Prior to Admission medications   Medication Sig Start Date End Date Taking? Authorizing Provider  amLODipine (NORVASC) 10 MG tablet Take 1 tablet (10 mg total) by mouth daily. Patient taking differently: Take 10 mg by mouth every morning.  06/05/14   Herminio Commons, MD  aspirin-acetaminophen-caffeine (EXCEDRIN EXTRA STRENGTH) [phone] MG tablet Take 2 tablets by mouth daily as needed for headache or migraine.     [provider]  cloNIDine (CATAPRES) 0.1 MG tablet Take 1 tablet (0.1 mg total) by mouth 2 (two) times daily. 11/13/18   Herminio Commons, MD  clopidogrel (PLAVIX) 75 MG tablet Take 75 mg by mouth every evening.     [provider]  dexlansoprazole (DEXILANT) 60 MG capsule Take 1 tab by mouth every morning. Patient taking differently: Take 60 mg by mouth every evening.  10/18/16   Esterwood, Amy S, PA-C  DULoxetine (CYMBALTA) 20 MG capsule Take 1 capsule (20 mg total) by mouth daily for 30 days, THEN 2 capsules (40 mg total) daily. 05/10/19 08/08/19  Gillis Santa, MD  EPINEPHrine (EPIPEN 2-PAK) 0.3 mg/0.3 mL IJ SOAJ injection Inject 0.3 mg into the muscle once as needed (for allergic reaction).     [provider]  gabapentin (NEURONTIN) 600 MG tablet Take 600 mg by mouth 4 (four) times daily.  [provider]  hydrALAZINE (APRESOLINE) 100 MG tablet Take 1 tablet (100 mg total) by mouth 2 (two) times daily. 05/11/19   Herminio Commons, MD  labetalol (NORMODYNE)  200 MG tablet Take 1 tablet (200 mg total) by mouth 2 (two) times daily. 05/11/19   Herminio Commons, MD  metFORMIN (GLUCOPHAGE) 500 MG tablet Take 250 mg by mouth daily. 05/18/18   [provider]  rOPINIRole (REQUIP) 4 MG tablet Take 4 mg by mouth at bedtime.    [provider]  tiZANidine (ZANAFLEX) 4 MG tablet Take 0.5-1 tablets (2-4 mg total) by mouth at bedtime as needed for muscle spasms. 05/10/19 05/09/20  Gillis Santa, MD  zolpidem (AMBIEN CR) 12.5 MG CR tablet Take 12.5 mg by mouth at bedtime. 05/16/17   [provider]    Allergies    Other, Penicillins, Zithromax [azithromycin], Aspirin, Pineapple, Strawberry extract, Spironolactone, Aspartame and phenylalanine, Mushroom extract complex, and Nicardipine  Review of Systems   Review of Systems  Constitutional: Negative for activity change, appetite change and fever.  HENT: Negative for facial swelling, trouble swallowing and voice change.   Eyes: Positive for photophobia. Negative for pain and visual disturbance.  Respiratory: Positive for cough. Negative for shortness of breath and wheezing.   Cardiovascular: Negative for chest pain.  Gastrointestinal: Negative for abdominal pain, diarrhea, nausea and vomiting.  Genitourinary: Negative for dysuria and flank pain.  Musculoskeletal: Positive for arthralgias (left shoulder pain). Negative for neck pain and neck stiffness.  Skin: Negative for rash and wound.  Neurological: Positive for dizziness, numbness and headaches. Negative for seizures, syncope, facial asymmetry, speech difficulty and weakness.  Psychiatric/Behavioral: Negative for confusion and decreased concentration. The patient is not nervous/anxious.     Physical Exam Updated Vital Signs BP (!) 190/98   Pulse 88   Temp 98.2 F (36.8 C) (Oral)   Resp 20   Ht 5\' 7"  (1.702 m)   Wt 97.5 kg   SpO2 100%   BMI 33.67 kg/m   Physical Exam Vitals and nursing note reviewed.  Constitutional:       General: She is not in acute distress.    Appearance: Normal appearance. She is not ill-appearing or toxic-appearing.  HENT:     Head: Atraumatic.     Mouth/Throat:     Mouth: Mucous membranes are moist.  Eyes:     Extraocular Movements: Extraocular movements intact.     Conjunctiva/sclera: Conjunctivae normal.     Pupils: Pupils are equal, round, and reactive to light.  Cardiovascular:     Rate and Rhythm: Normal rate and regular rhythm.     Pulses: Normal pulses.  Pulmonary:     Effort: Pulmonary effort is normal.     Breath sounds: Normal breath sounds.  Chest:     Chest wall: No tenderness.  Abdominal:     Palpations: Abdomen is soft.     Tenderness: There is no abdominal tenderness.  Musculoskeletal:     Left shoulder: Tenderness present. No crepitus. Decreased range of motion.     Cervical back: Normal range of motion. No rigidity or tenderness.     Right lower leg: No edema.     Left lower leg: No edema.     Comments: ttp of the anterior left shoulder joint.  Pain with attempted ROM.    Skin:    General: Skin is warm.     Capillary Refill: Capillary refill takes less than 2 seconds.     Findings: No erythema  or rash.  Neurological:     Mental Status: She is alert and oriented to person, place, and time.     GCS: GCS eye subscore is 4. GCS verbal subscore is 5. GCS motor subscore is 6.     Comments: patient alert, no dysarthria, no appreciable facial droop, smile is asymmetric and grip strength is symmetrical, but diminished.  Pt has some strength to resistance of the UE's without definite pronator drift.  Pt able to dorsiflex and plantarflex bilaterally.       ED Results / Procedures / Treatments   Labs (all labs ordered are listed, but only abnormal results are displayed) Labs Reviewed  DIFFERENTIAL - Abnormal; Notable for the following components:      Result Value   Lymphs Abs 4.7 (*)    All other components within normal limits  COMPREHENSIVE METABOLIC  PANEL - Abnormal; Notable for the following components:   Glucose, Bld 106 (*)    Calcium 8.7 (*)    AST 12 (*)    Total Bilirubin 0.2 (*)    All other components within normal limits  SARS CORONAVIRUS 2 (TAT 6-24 HRS)  ETHANOL  PROTIME-INR  APTT  CBC  RAPID URINE DRUG SCREEN, HOSP PERFORMED  URINALYSIS, ROUTINE W REFLEX MICROSCOPIC    EKG EKG Interpretation  Date/Time:  Monday June 11 2019 20:40:57 EST Ventricular Rate:  74 PR Interval:    QRS Duration: 99 QT Interval:  402 QTC Calculation: 446 R Axis:   47 Text Interpretation: Sinus rhythm No STEMI Confirmed by Nanda Quinton [phone]) on 06/11/2019 8:48:39 PM   Radiology CT Head Wo Contrast  Result Date: 06/11/2019 CLINICAL DATA:  59 year old female with TIA. EXAM: CT HEAD WITHOUT CONTRAST TECHNIQUE: Contiguous axial images were obtained from the base of the skull through the vertex without intravenous contrast. COMPARISON:  Head CT dated 06/19/2018. FINDINGS: Brain: The ventricles and sulci appropriate size for patient's age. The gray-white matter discrimination is preserved. There is no acute intracranial hemorrhage. No mass effect or midline shift no extra-axial fluid collection. Vascular: No hyperdense vessel or unexpected calcification. Skull: Normal. Negative for fracture or focal lesion. Sinuses/Orbits: Opacification of several ethmoid air cells. No air-fluid level. The remainder of the visualized paranasal sinuses and mastoid air cells are clear. Other: None IMPRESSION: Unremarkable noncontrast CT of the brain. Electronically Signed   By: Anner Crete M.D.   On: 06/11/2019 20:57   DG Chest Portable 1 View  Result Date: 06/11/2019 CLINICAL DATA:  Nonproductive cough. Chills. Shortness of breath. Headache. Chest pain. EXAM: PORTABLE CHEST 1 VIEW COMPARISON:  Chest x-ray 11/27/2018 and chest CT dated 03/22/2018 FINDINGS: The heart size and pulmonary vascularity are normal and the lungs are clear. Slight prominence of the  left pericardial fat pad, unchanged. No significant bone abnormality. IMPRESSION: No active disease. Electronically Signed   By: Lorriane Shire M.D.   On: 06/11/2019 20:50    Procedures Procedures (including critical care time)  Medications Ordered in ED Medications  diphenhydrAMINE (BENADRYL) injection 25 mg (25 mg Intravenous Refused 06/11/19 2333)  ketorolac (TORADOL) 30 MG/ML injection 30 mg (30 mg Intravenous Given 06/11/19 2332)  metoCLOPramide (REGLAN) injection 10 mg (10 mg Intravenous Given 06/11/19 2333)    ED Course  I have reviewed the triage vital signs and the nursing notes.  Pertinent labs & imaging results that were available during my care of the patient were reviewed by me and considered in my medical decision making (see chart for details).  MDM Rules/Calculators/A&P                      Pt with hx of CVA/TIA with week long hx of headache and numbness, tingling sensation to bilateral hands and feet.  Diminished grip strength w/o dysarthria.  Possible atypical migraine, but given hx will consult teleneurology.  Pt also seen by Dr. Laverta Baltimore.    teleneurology nurse will have tele neurology provider evaluate w/in one hour.   [facility] neurologist, Dr. Mike Craze, who recommends admission and MR/MRA of head and MR C spine for tomorrow.     2325 consulted hospitalist and discussed findings and he agrees to admit.      Final Clinical Impression(s) / ED Diagnoses Final diagnoses:  Acute nonintractable headache, unspecified headache type    Rx / DC Orders ED Discharge Orders    None       Kem Parkinson, PA-C 06/12/19 0021    Long, Wonda [facility], MD 06/14/19 1329

## 2019-06-11 NOTE — ED Notes (Signed)
Pt states she does not need to urinate at this time, aware of DO, call light w/in reach

## 2019-06-12 ENCOUNTER — Observation Stay (HOSPITAL_COMMUNITY): Payer: Medicare Other

## 2019-06-12 ENCOUNTER — Other Ambulatory Visit (HOSPITAL_COMMUNITY): Payer: Medicare Other

## 2019-06-12 DIAGNOSIS — R519 Headache, unspecified: Secondary | ICD-10-CM | POA: Diagnosis present

## 2019-06-12 LAB — LIPID PANEL
Cholesterol: 113 mg/dL (ref 0–200)
HDL: 28 mg/dL — ABNORMAL LOW (ref >40)
LDL Cholesterol: 64 mg/dL (ref 0–99)
Total CHOL/HDL Ratio: 4 RATIO
Triglycerides: 105 mg/dL (ref <150)
VLDL: 21 mg/dL (ref 0–40)

## 2019-06-12 LAB — TSH: TSH: 2.742 u[IU]/mL (ref 0.350–4.500)

## 2019-06-12 LAB — SARS CORONAVIRUS 2 (TAT 6-24 HRS): SARS Coronavirus 2: NEGATIVE

## 2019-06-12 LAB — HIV ANTIBODY (ROUTINE TESTING W REFLEX): HIV Screen 4th Generation wRfx: NONREACTIVE

## 2019-06-12 MED ORDER — STROKE: EARLY STAGES OF RECOVERY BOOK
Freq: Once | Status: DC
  Filled 2019-06-12: qty 1

## 2019-06-12 MED ORDER — ROPINIROLE HCL 1 MG PO TABS
4.0000 mg | ORAL_TABLET | Freq: Every day | ORAL | Status: DC
  Filled 2019-06-12: qty 4

## 2019-06-12 MED ORDER — ACETAMINOPHEN 160 MG/5ML PO SOLN: 650.0000 mg | ORAL | Status: DC | PRN

## 2019-06-12 MED ORDER — ENOXAPARIN SODIUM 40 MG/0.4ML ~~LOC~~ SOLN: 40.0000 mg | SUBCUTANEOUS | Status: DC

## 2019-06-12 MED ORDER — HYDRALAZINE HCL 25 MG PO TABS: 100.0000 mg | ORAL_TABLET | Freq: Two times a day (BID) | ORAL | Status: DC

## 2019-06-12 MED ORDER — GABAPENTIN 300 MG PO CAPS
600.0000 mg | ORAL_CAPSULE | Freq: Four times a day (QID) | ORAL | Status: DC
  Administered 2019-06-12: 06:00:00 600 mg via ORAL
  Filled 2019-06-12: qty 2

## 2019-06-12 MED ORDER — ZOLPIDEM TARTRATE 5 MG PO TABS: 5.0000 mg | ORAL_TABLET | Freq: Every evening | ORAL | Status: DC | PRN

## 2019-06-12 MED ORDER — LABETALOL HCL 200 MG PO TABS: 200.0000 mg | ORAL_TABLET | Freq: Two times a day (BID) | ORAL | Status: DC

## 2019-06-12 MED ORDER — ROSUVASTATIN CALCIUM 5 MG PO TABS
5.0000 mg | ORAL_TABLET | Freq: Every morning | ORAL | Status: DC
  Filled 2019-06-12 (×2): qty 1

## 2019-06-12 MED ORDER — GABAPENTIN 300 MG PO CAPS: 600.0000 mg | ORAL_CAPSULE | Freq: Four times a day (QID) | ORAL | Status: DC

## 2019-06-12 MED ORDER — ACETAMINOPHEN 650 MG RE SUPP: 650.0000 mg | RECTAL | Status: DC | PRN

## 2019-06-12 MED ORDER — PANTOPRAZOLE SODIUM 40 MG PO TBEC: 40.0000 mg | DELAYED_RELEASE_TABLET | Freq: Every day | ORAL | Status: DC

## 2019-06-12 MED ORDER — INSULIN ASPART 100 UNIT/ML ~~LOC~~ SOLN: 0.0000 [IU] | Freq: Three times a day (TID) | SUBCUTANEOUS | Status: DC

## 2019-06-12 MED ORDER — MONTELUKAST SODIUM 10 MG PO TABS: 10.0000 mg | ORAL_TABLET | Freq: Every day | ORAL | Status: DC

## 2019-06-12 MED ORDER — ACETAMINOPHEN 325 MG PO TABS: 650.0000 mg | ORAL_TABLET | ORAL | Status: DC | PRN

## 2019-06-12 MED ORDER — AMLODIPINE BESYLATE 5 MG PO TABS: 10.0000 mg | ORAL_TABLET | Freq: Every morning | ORAL | Status: DC

## 2019-06-12 MED ORDER — TIZANIDINE HCL 2 MG PO TABS
2.0000 mg | ORAL_TABLET | Freq: Every evening | ORAL | Status: DC | PRN
  Filled 2019-06-12: qty 2

## 2019-06-12 MED ORDER — CLOPIDOGREL BISULFATE 75 MG PO TABS: 75.0000 mg | ORAL_TABLET | Freq: Every morning | ORAL | Status: DC

## 2019-06-12 NOTE — ED Notes (Signed)
Pt c/o RLS. hospitalist notified for prn

## 2019-06-12 NOTE — ED Notes (Addendum)
Patient stated she is refusing anymore care and was going home.  Patient agreed to sign AMA.  MD made aware.

## 2019-06-12 NOTE — ED Notes (Signed)
Pt sitting on edge of bed. Pt given coffee. Supervisor requested to being in meal for pt.

## 2019-06-13 LAB — HEMOGLOBIN A1C
Hgb A1c MFr Bld: 5.9 % — ABNORMAL HIGH (ref 4.8–5.6)
Mean Plasma Glucose: 123 mg/dL

## 2019-06-20 ENCOUNTER — Telehealth: Payer: Self-pay

## 2019-06-20 NOTE — Telephone Encounter (Signed)
LM for patient to call our office to go over pre appointment questions.

## 2019-06-20 NOTE — Telephone Encounter (Signed)
Pt returned the call. Please give her a call.                                             Thanks

## 2019-06-21 ENCOUNTER — Ambulatory Visit
Payer: Medicare Other | Attending: Student in an Organized Health Care Education/Training Program | Admitting: Student in an Organized Health Care Education/Training Program

## 2019-06-21 ENCOUNTER — Encounter: Payer: Self-pay | Admitting: Student in an Organized Health Care Education/Training Program

## 2019-06-21 ENCOUNTER — Other Ambulatory Visit: Payer: Self-pay

## 2019-06-21 DIAGNOSIS — M5116 Intervertebral disc disorders with radiculopathy, lumbar region: Secondary | ICD-10-CM

## 2019-06-21 DIAGNOSIS — I639 Cerebral infarction, unspecified: Secondary | ICD-10-CM | POA: Diagnosis present

## 2019-06-21 DIAGNOSIS — G8929 Other chronic pain: Secondary | ICD-10-CM | POA: Diagnosis present

## 2019-06-21 DIAGNOSIS — I6521 Occlusion and stenosis of right carotid artery: Secondary | ICD-10-CM | POA: Diagnosis present

## 2019-06-21 DIAGNOSIS — Z981 Arthrodesis status: Secondary | ICD-10-CM | POA: Diagnosis present

## 2019-06-21 DIAGNOSIS — M5416 Radiculopathy, lumbar region: Secondary | ICD-10-CM | POA: Diagnosis present

## 2019-06-21 DIAGNOSIS — M9973 Connective tissue and disc stenosis of intervertebral foramina of lumbar region: Secondary | ICD-10-CM

## 2019-06-21 DIAGNOSIS — G894 Chronic pain syndrome: Secondary | ICD-10-CM | POA: Insufficient documentation

## 2019-06-21 DIAGNOSIS — M5136 Other intervertebral disc degeneration, lumbar region: Secondary | ICD-10-CM | POA: Diagnosis present

## 2019-06-21 DIAGNOSIS — Z8673 Personal history of transient ischemic attack (TIA), and cerebral infarction without residual deficits: Secondary | ICD-10-CM

## 2019-06-21 DIAGNOSIS — M48061 Spinal stenosis, lumbar region without neurogenic claudication: Secondary | ICD-10-CM

## 2019-06-21 MED ORDER — PREGABALIN 100 MG PO CAPS: 100.0000 mg | ORAL_CAPSULE | Freq: Three times a day (TID) | ORAL | 1 refills | Status: DC

## 2019-06-21 NOTE — Progress Notes (Signed)
Patient: Katrina [name]  Service Category: E/M  Provider: Gillis Santa, MD  DOB: Jan 14, 1961  DOS: 06/21/2019  Location: Office  MRN: [id number]  Setting: Ambulatory outpatient  Referring Provider: [name], FNP  Type: Established Patient  Specialty: Interventional Pain Management  PCP: [name], FNP  Location: Home  Delivery: TeleHealth     Virtual Encounter - Pain Management PROVIDER NOTE: Information contained herein reflects review and annotations entered in association with encounter. Interpretation of such information and data should be left to medically-trained personnel. Information provided to patient can be located elsewhere in the medical record under "Patient Instructions". Document created using STT-dictation technology, any transcriptional errors that may result from process are unintentional.    Contact & Pharmacy Preferred: [phone] Home: [phone] (home) Mobile: [phone] (mobile) E-mail: [email or web address] [place], [place] - [address] AT [address] [place] [address] Phone: [phone] Fax: [phone]   Pre-screening  Katrina [name] offered "in-person" vs "virtual" encounter. She indicated preferring virtual for this encounter.   Reason COVID-19*  Social distancing based on CDC and AMA recommendations.   I contacted Katrina [name] on 06/21/2019 via video conference.      I clearly identified myself as Gillis Santa, MD. I verified that I was speaking with the correct person using two identifiers (Name: JOHN RECENDIZ, and date of birth: 1961/01/23).  Consent I sought verbal advanced consent from Katrina [name] for virtual visit interactions. I informed Katrina [name] of possible security and privacy concerns, risks, and limitations associated with providing "not-in-person" medical evaluation and management services. I also informed Katrina [name] of the availability  of "in-person" appointments. Finally, I informed her that there would be a charge for the virtual visit and that she could be  personally, fully or partially, financially responsible for it. Katrina [name] expressed understanding and agreed to proceed.   Historic Elements   Katrina [name] is a 59 y.o. year old, female patient evaluated today after her last encounter by our practice on 06/20/2019. Katrina [name]  has a past medical history of Allergy, Anemia [phone] ), Anxiety, Arthritis, Asthma, Chronic bronchitis ([place]), Chronic kidney disease, Chronic lower back pain, Claustrophobia, COPD (chronic obstructive pulmonary disease) ([place]), Daily headache, Depression, Diverticulitis, Family history of adverse reaction to anesthesia, GERD (gastroesophageal reflux disease), Heart murmur, History of blood transfusion (1975-1976 "several"), History of colitis, History of colon polyps, History of gastric ulcer, History of hiatal hernia, History of MRSA infection (2017), Hyperlipidemia, Hypertension, Insomnia, Iron deficiency anemia, Lung nodules, Migraine, MS (multiple sclerosis) ([place]), Noncompliance, Osteoporosis, Peripheral neuropathy, PTSD (post-traumatic stress disorder) (dx'd 2016/2017), Restless leg syndrome, Stroke ([place]) (2015; 2016; 2017), and Type II diabetes mellitus ([place]). She also  has a past surgical history that includes Ankle surgery (Bilateral, 1993; 1995 X2); Colonoscopy; Esophagogastroduodenoscopy; Radiology with anesthesia (N/A, 01/15/2016); Radiology with anesthesia (N/A, 06/29/2016); Breast lumpectomy (Left); Posterior lumbar fusion (2005); Tumor excision (1998); Nasal sinus surgery (1975); Nasal reconstruction (1976); Abdominal hysterectomy (12/1987); Dilation and curettage of uterus; Tubal ligation; Tonsillectomy (1992); Adenoidectomy (1975); Appendectomy (1989); Laparoscopic cholecystectomy (1996); Cardiac catheterization (N/A, 01/31/2015); ABDOMINAL AORTOGRAM (N/A, 12/03/2016); RENAL ANGIOGRAPHY (N/A,  12/03/2016); and Breast excisional biopsy (Left). Katrina [name] has a current medication list which includes the following prescription(s): amlodipine, aspirin-acetaminophen-caffeine, clopidogrel, dexlansoprazole, epinephrine, hydralazine, labetalol, metformin, montelukast, rizatriptan, ropinirole, rosuvastatin, tizanidine, zolpidem, and pregabalin. She  reports that she has been smoking cigarettes. She started smoking about 48 years  ago. She has a 45.00 pack-year smoking history. She has never used smokeless tobacco. She reports that she does not drink alcohol or use drugs. Katrina [name] is allergic to cymbalta [duloxetine hcl]; other; penicillins; zithromax [azithromycin]; aspirin; pineapple; strawberry extract; spironolactone; aspartame and phenylalanine; mushroom extract complex; and nicardipine.   HPI  Second patient visit today to review lumbar MRI results.  This was done virtually and the radiographic images were shared via video conference with the patient and results were reviewed.  Patient has a right laminectomy at L5-S1 with fusion.  She has adjacent segment disease at L2-L3 and L3-L4 with a left lateral disc protrusion affecting the left L3 nerve root.  Of note, at the first visit, Cymbalta was started however the patient was unable to tolerate due to increase in heart rate and associated palpitations.  This improved once the patient discontinued Cymbalta.  She is currently on gabapentin 600 mg 4 times a day.  She states that she has been on this dose for a long part of time and has been on gabapentin for over 5 years but as medication has become less effective.  Focus on nonopioid-based pain management.  Patient states that she is not interested in injection therapies or spinal cord stimulator trial.  I did offer her a L3-L4 epidural steroid injection we also discussed spinal cord stimulation but patient states that due to her anxiety and bad experience from previous injections, she is not  interested at this time.  At this point given that the patient has tried and failed Cymbalta and is currently on gabapentin without having a optimal response, discussed transitioning to Lyrica.  Risks and benefits reviewed and patient would like to proceed.  Future considerations include low-dose amitriptyline, nortriptyline.  Laboratory Chemistry Profile (12 mo)  Renal: 06/11/2019: BUN 16; Creatinine, Ser 0.78  Lab Results  Component Value Date   GFRAA >60 06/11/2019   GFRNONAA >60 06/11/2019   Hepatic: 06/11/2019: Albumin 3.5 Lab Results  Component Value Date   AST 12 (L) 06/11/2019   ALT 12 06/11/2019   Other: No results found for requested labs within last 8760 hours. Note: Above Lab results reviewed.  Imaging  CT Head Wo Contrast CLINICAL DATA:  59 year old female with TIA.  EXAM: CT HEAD WITHOUT CONTRAST  TECHNIQUE: Contiguous axial images were obtained from the base of the skull through the vertex without intravenous contrast.  COMPARISON:  Head CT dated 06/19/2018.  FINDINGS: Brain: The ventricles and sulci appropriate size for patient's age. The gray-white matter discrimination is preserved. There is no acute intracranial hemorrhage. No mass effect or midline shift no extra-axial fluid collection.  Vascular: No hyperdense vessel or unexpected calcification.  Skull: Normal. Negative for fracture or focal lesion.  Sinuses/Orbits: Opacification of several ethmoid air cells. No air-fluid level. The remainder of the visualized paranasal sinuses and mastoid air cells are clear.  Other: None  IMPRESSION: Unremarkable noncontrast CT of the brain.  Electronically Signed   By: Anner Crete M.D.   On: 06/11/2019 20:57 DG Chest Portable 1 View CLINICAL DATA:  Nonproductive cough. Chills. Shortness of breath. Headache. Chest pain.  EXAM: PORTABLE CHEST 1 VIEW  COMPARISON:  Chest x-ray 11/27/2018 and chest CT dated 03/22/2018  FINDINGS: The heart size and  pulmonary vascularity are normal and the lungs are clear. Slight prominence of the left pericardial fat pad, unchanged.  No significant bone abnormality.  IMPRESSION: No active disease.  Electronically Signed   By: Lorriane Shire M.D.   On:  06/11/2019 20:50  L-MRI 05-28-19  CLINICAL DATA:  Low back pain extending into the right lower extremity for 1 year. Multiple falls. Lumbar fusion 15 years ago.  EXAM: MRI LUMBAR SPINE WITHOUT CONTRAST  TECHNIQUE: Multiplanar, multisequence MR imaging of the lumbar spine was performed. No intravenous contrast was administered.  COMPARISON:  MRI of the lumbar spine 01/15/2016  FINDINGS: Segmentation: 5 non rib-bearing lumbar type vertebral bodies are present. The lowest fully formed vertebral body is L5.  Alignment: Slight degenerative retrolisthesis at L3-4 and L4-5 is stable. There is slight straightening of the normal lumbar lordosis.  Vertebrae:  Marrow signal and vertebral body heights are normal.  Conus medullaris and cauda equina: Conus extends to the L1 level. Conus and cauda equina appear normal.  Paraspinal and other soft tissues: Limited imaging the abdomen is unremarkable. There is no significant adenopathy. No solid organ lesions are present.  Disc levels:  L1-2: Negative.  L2-3: A far left lateral disc protrusion is present. Mild left foraminal stenosis is present. The central canal is patent. Mild facet hypertrophy has progressed.  L3-4: A far left lateral disc protrusion has progressed. This likely contacts the exiting left L3 nerve roots. The central canal is patent. Mild facet hypertrophy is present bilaterally.  L4-5: A broad-based disc protrusion is present. Moderate right and mild left foraminal narrowing demonstrates slight progression.  L5-S1: Right laminectomy is again noted. Fusion is solid. No residual or recurrent stenosis is present.  IMPRESSION: 1. Moderate right and mild left  foraminal stenosis at L4-5 demonstrates slight progression. This is the most significant right-sided disease. 2. Far left lateral disc protrusion at L2-3 with mild left foraminal stenosis. 3. Far left lateral disc protrusion at L3-4 likely contacts the exiting left L3 nerve roots. 4. Right laminectomy at L5-S1 without residual or recurrent stenosis.  Assessment  The primary encounter diagnosis was Lumbar radiculopathy. 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  Plan of Care  I have discontinued Airiel L. Hornung "Debby"'s gabapentin and DULoxetine. I am also having her start on pregabalin. Additionally, I am having her maintain her amLODipine, clopidogrel, rOPINIRole, dexlansoprazole, zolpidem, EPINEPHrine, aspirin-acetaminophen-caffeine, metFORMIN, tiZANidine, labetalol, hydrALAZINE, rosuvastatin, montelukast, and rizatriptan.   1.  Reviewed MRI in detail (screen share with patient).  Patient's pain is likely coming from left L3 radiculopathy.  Stable L5-S1 fusion.  Discussed interventional therapies including L3-L4 epidural steroid injection and or spinal cord stimulator trial.  Patient states that she has anxiety and PTSD from previous injections.  She is not interested at this time.  2.  In regards to medication management, avoid opioid analgesics.  Has been on gabapentin 600 mg 4 times daily for many years states that is not effective.  Cymbalta was prescribed at her first visit with me but resulted in heart palpitations this was discontinued.  Discussed alternatives including substituting gabapentin with Lyrica or adding a tricyclic antidepressant such as low-dose amitriptyline for neuropathic pain to her  current dose of gabapentin.  Patient prefers transitioning to Lyrica.  Risk and benefits reviewed.  Instructed patient to start at 100 mg twice daily primarily followed by 100 mg 3 times daily.  3.  Follow-up in 2 months.  Pharmacotherapy (Medications Ordered): Meds ordered this encounter  Medications  . pregabalin (LYRICA) 100 MG capsule  Sig: Take 1 capsule (100 mg total) by mouth 3 (three) times daily.    Dispense:  90 capsule    Refill:  1    Do not place this medication, or any other prescription from our practice, on "Automatic Refill". Patient may have prescription filled one day early if pharmacy is closed on scheduled refill date.   Follow-up plan:   Return in about 8 weeks (around 08/16/2019) for Medication Management, virtual.      Recent Visits Date Type Provider Dept  05/10/19 Office Visit Gillis Santa, MD [facility]  Showing recent visits within past 90 days and meeting all other requirements   Today's Visits Date Type Provider Dept  06/21/19 Office Visit Gillis Santa, MD [facility]  Showing today's visits and meeting all other requirements   Future Appointments No visits were found meeting these conditions.  Showing future appointments within next 90 days and meeting all other requirements   I discussed the assessment and treatment plan with the patient. The patient was provided an opportunity to ask questions and all were answered. The patient agreed with the plan and demonstrated an understanding of the instructions.  Patient advised to call back or seek an in-person evaluation if the symptoms or condition worsens.  Total duration of non-face-to-face encounter: 30 minutes.  Note by: Gillis Santa, MD Date: 06/21/2019; Time: 2:32 PM

## 2019-07-07 IMAGING — CT CT RENAL STONE PROTOCOL
2 of 4 series · 16 of 46 positions shown, 18 images · non-contrast
Comparison: CT abdomen pelvis 08/20/2017.

CLINICAL DATA: Patient with history of right flank pain. Possible
renal stone.

EXAM:
CT ABDOMEN AND PELVIS WITHOUT CONTRAST
TECHNIQUE: Multidetector CT imaging of the abdomen and pelvis was performed
following the standard protocol without IV contrast.

[Series 2: axial st · axial · 0.82mm/px · z∈[+656,+1111]mm · 13 of 99 slices shown, 15 images]
[im 4/99  soft-tissue]
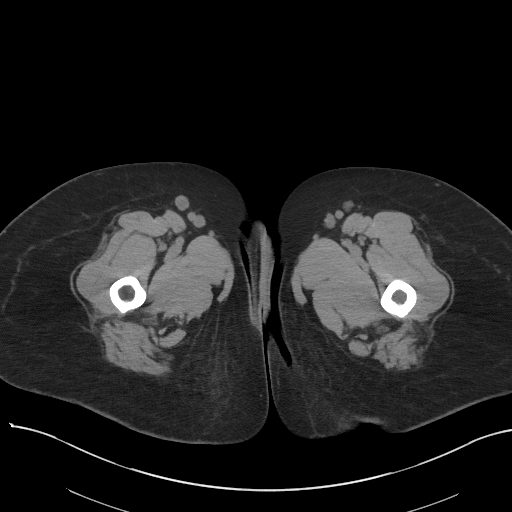
[im 4/99  bone]
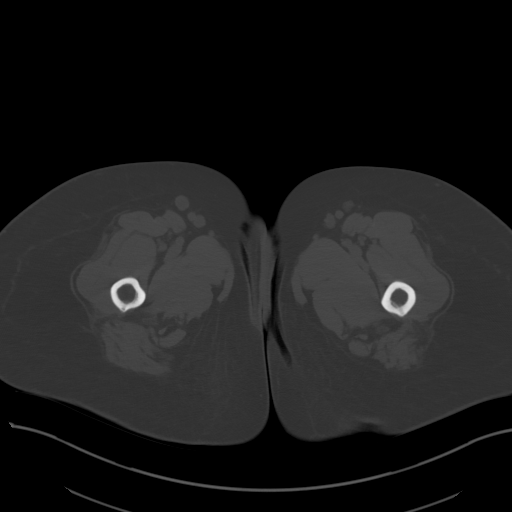
[im 12/99  soft-tissue]
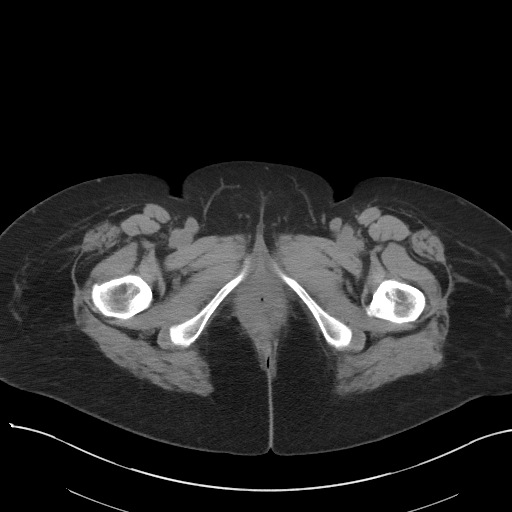
[im 19/99  soft-tissue]
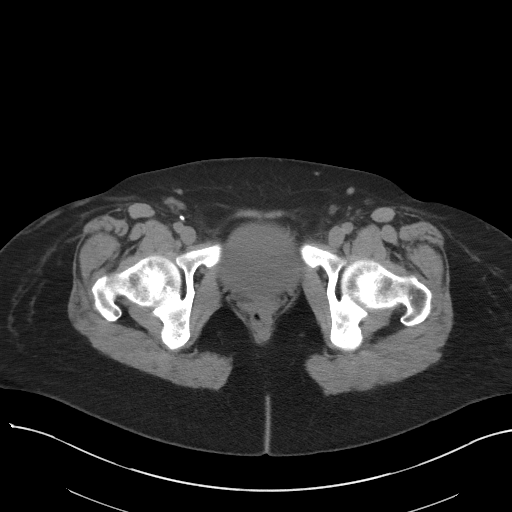
[im 27/99  soft-tissue]
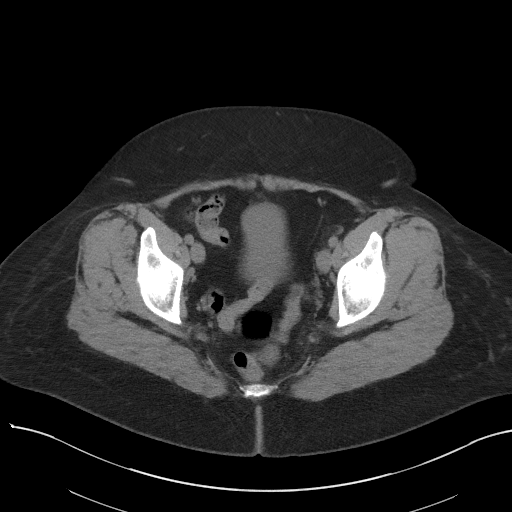
[im 34/99  soft-tissue]
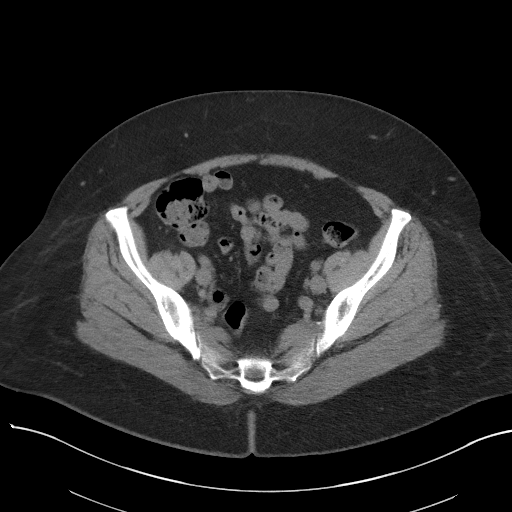
[im 42/99  soft-tissue]
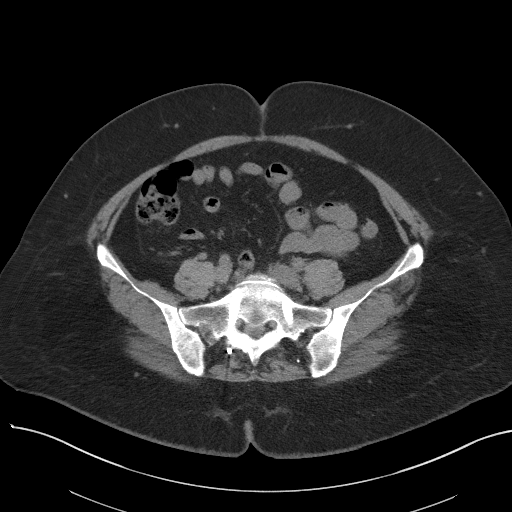
[im 50/99  soft-tissue]
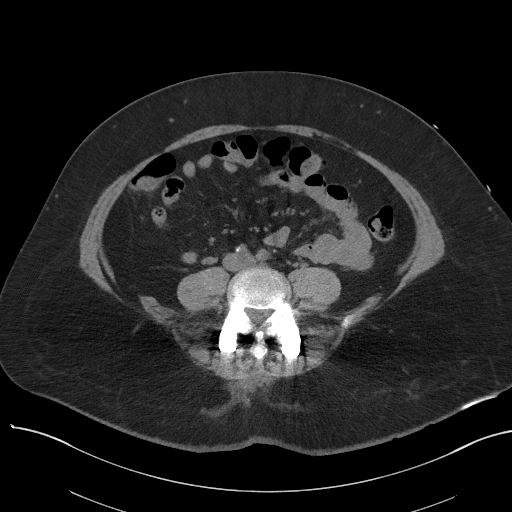
[im 57/99  soft-tissue]
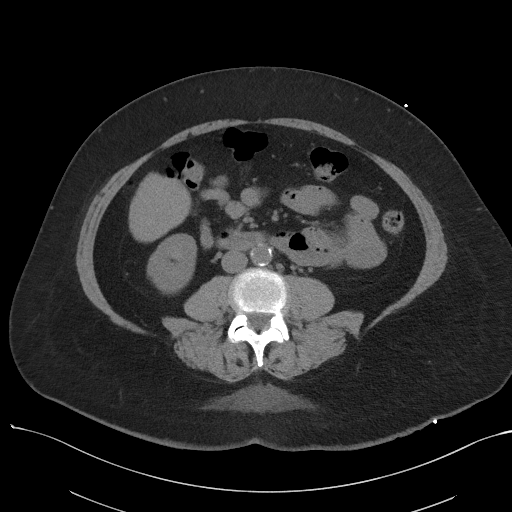
[im 65/99  soft-tissue]
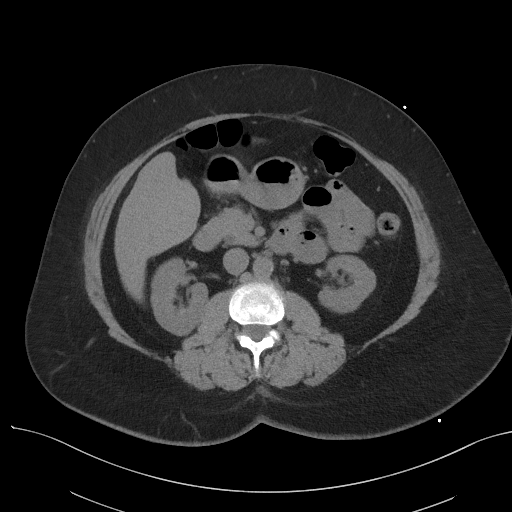
[im 65/99  bone]
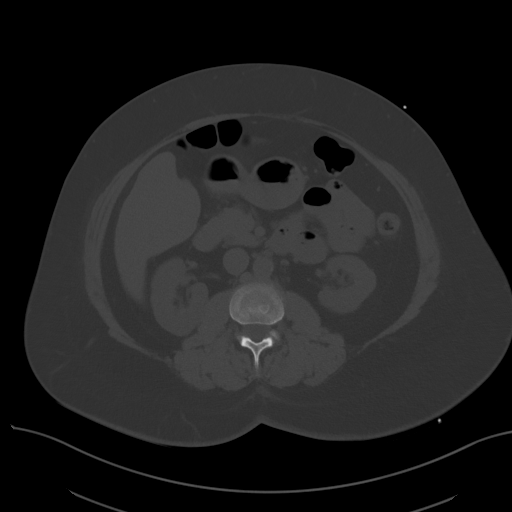
[im 72/99  soft-tissue]
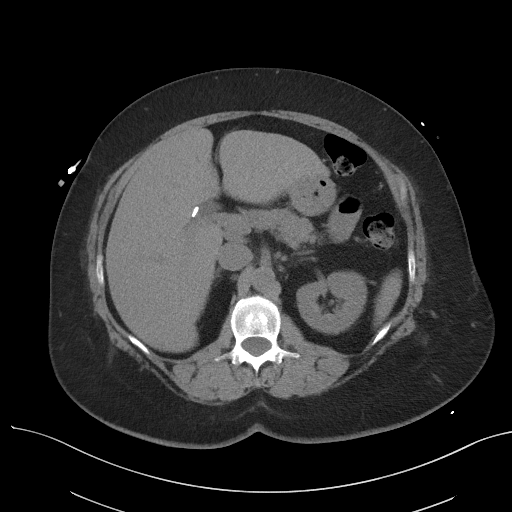
[im 80/99  soft-tissue]
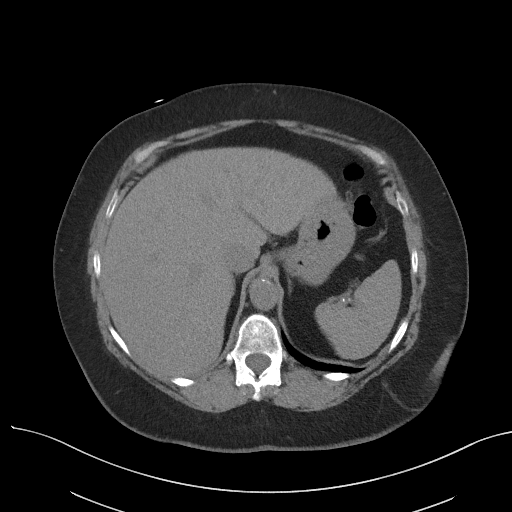
[im 87/99  soft-tissue]
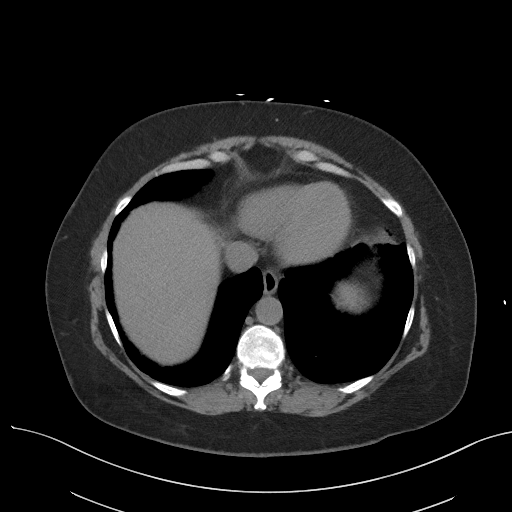
[im 95/99  soft-tissue]
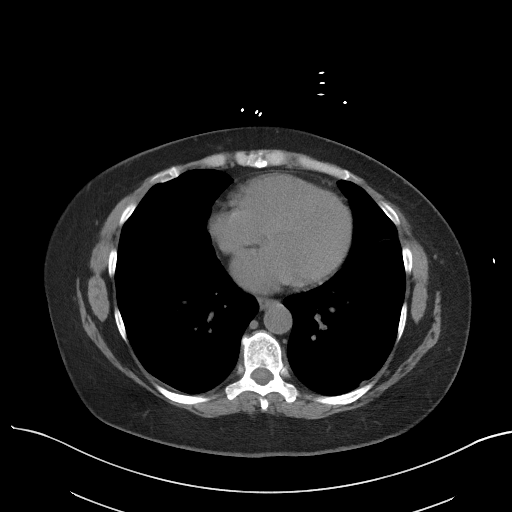

[Series 5: coronal st · coronal · 0.82mm/px · 3 of 101 slices shown]
[im 34/101  soft-tissue]
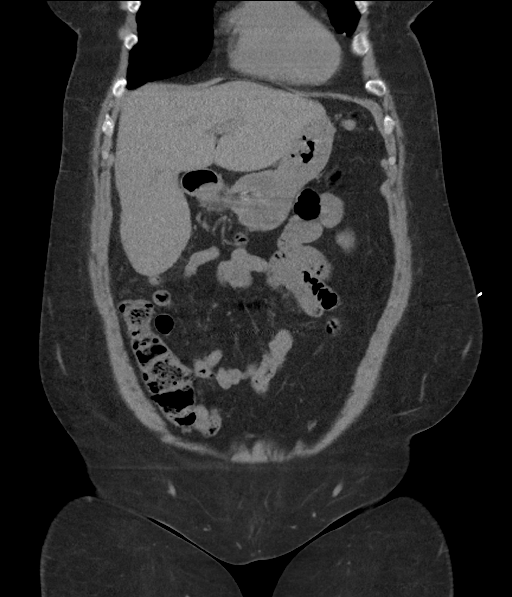
[im 45/101  soft-tissue]
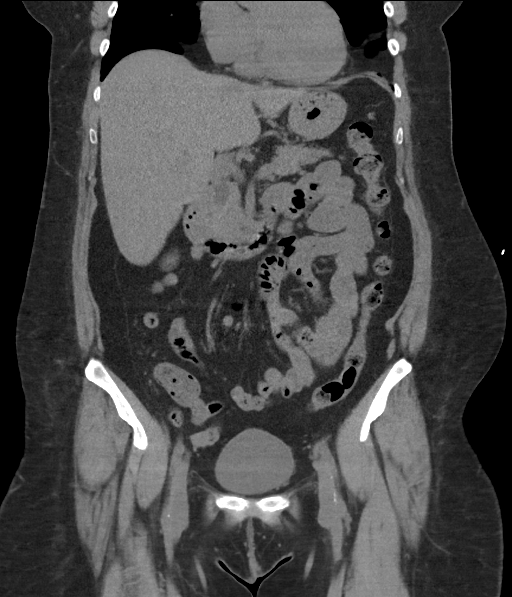
[im 56/101  soft-tissue]
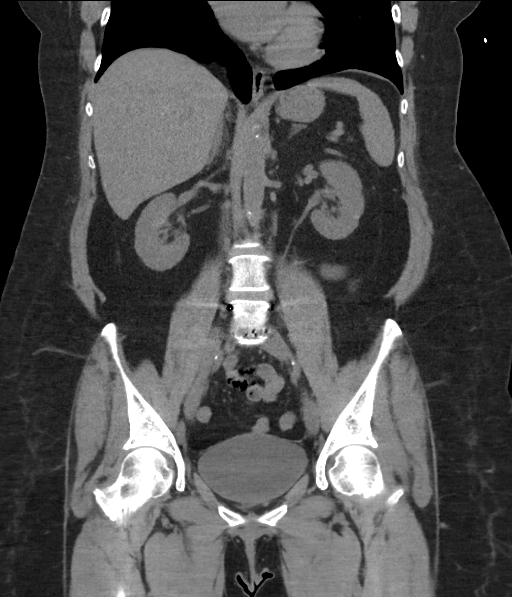

[16 of 46 positions shown; findings below may reference images not displayed]

FINDINGS: Lower chest: Normal heart size. Dependent atelectasis within the
bilateral lower lobes. No pleural effusion. Stable 3 mm left lower
lobe nodule (image 9; series 4).

Hepatobiliary: Liver is normal in size and contour. Patient status
post cholecystectomy. Stable prominent common bile duct, likely
physiologic given cholecystectomy state.

Pancreas: Unremarkable

Spleen: Unremarkable

Adrenals/Urinary Tract: Adrenal glands are normal. Kidneys are
symmetric in size. No hydronephrosis. No nephroureterolithiasis.
Urinary bladder is unremarkable.

Stomach/Bowel: No abnormal bowel wall thickening or evidence for
bowel obstruction. No free fluid or free intraperitoneal air. Normal
morphology of the stomach.

Vascular/Lymphatic: Normal caliber abdominal aorta. Peripheral
calcified atherosclerotic plaque. No retroperitoneal
lymphadenopathy.

Reproductive: Status post hysterectomy.

Other: None.

Musculoskeletal: Lumbar spine degenerative changes. Postsurgical
hardware.
IMPRESSION: No acute process within the abdomen or pelvis.

## 2019-07-28 IMAGING — DX DG CHEST 2V
2 series · 2 of 2 positions shown · non-contrast
Comparison: Chest x-ray of August 20, 2017

CLINICAL DATA: Left-sided chest discomfort under the breast with a
palpable knot for the past 3 days. No known injury. The patient
reports cough and shortness of breath. History of pulmonary nodules,
COPD, current smoker.

EXAM:
CHEST - 2 VIEW

[chest pa]
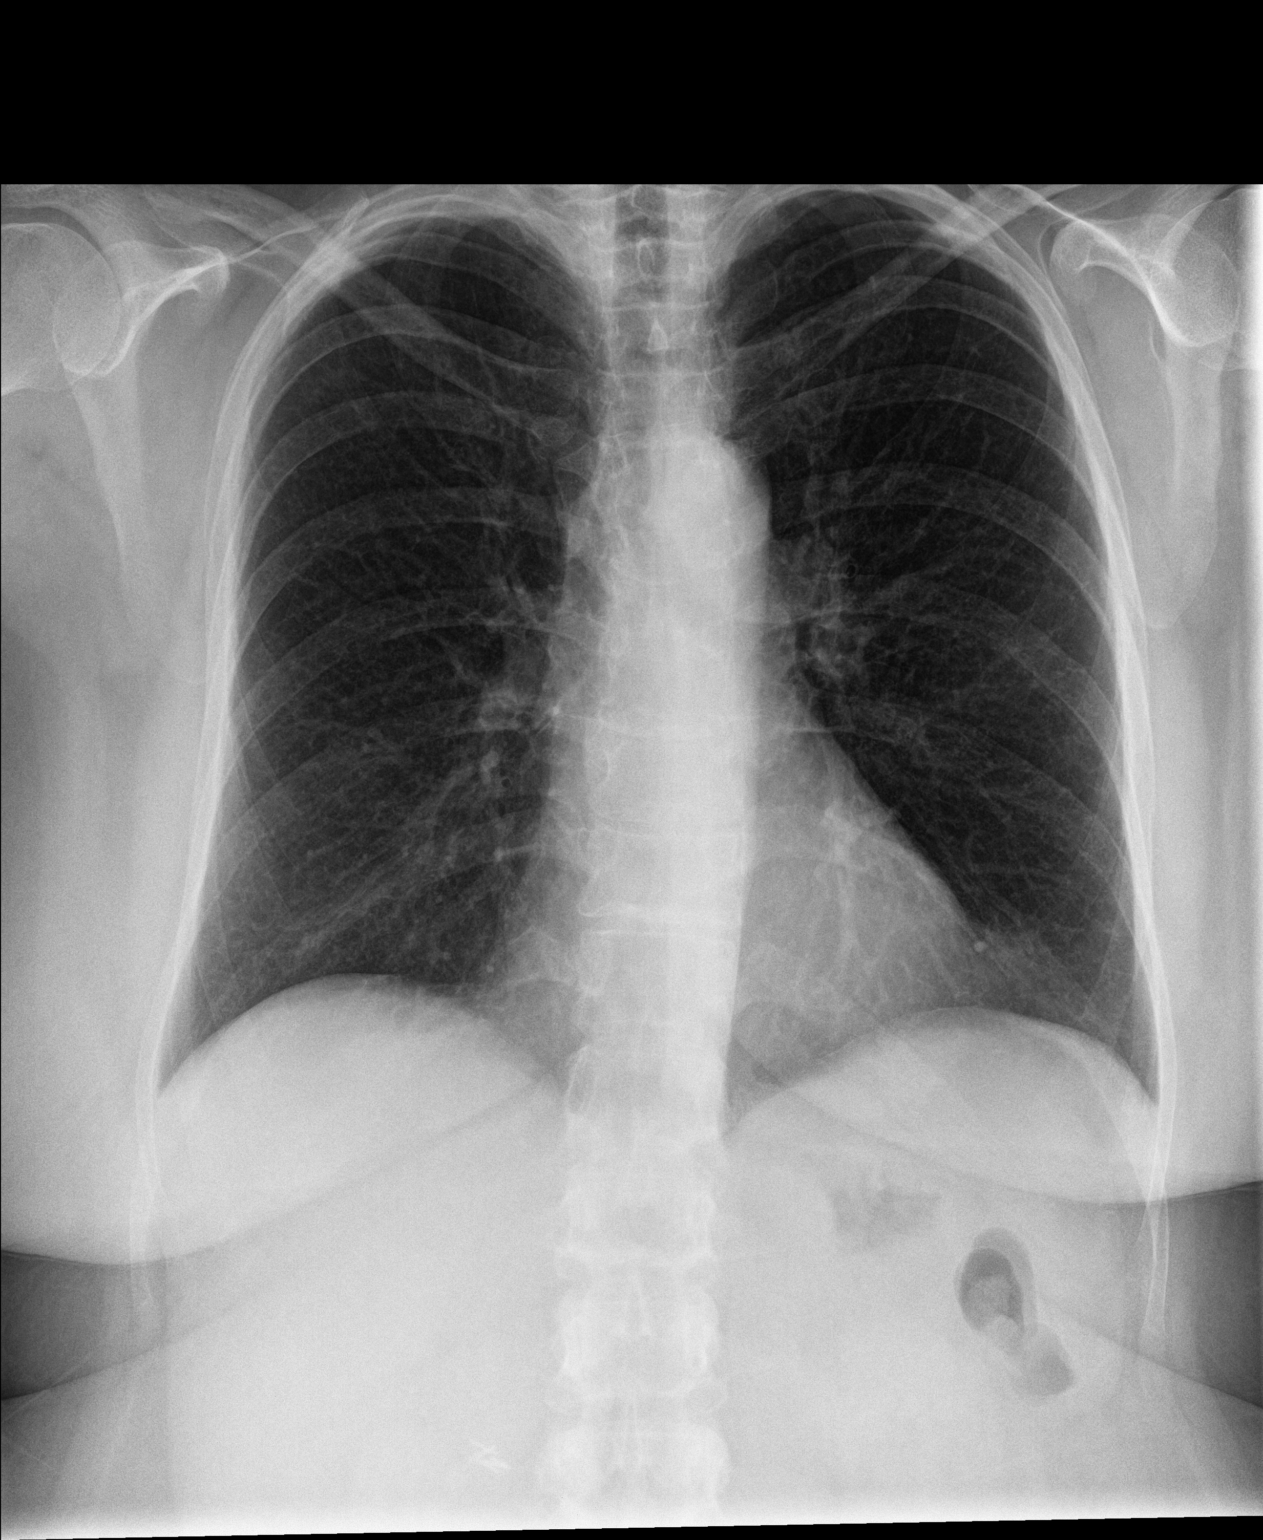

[chest lat]
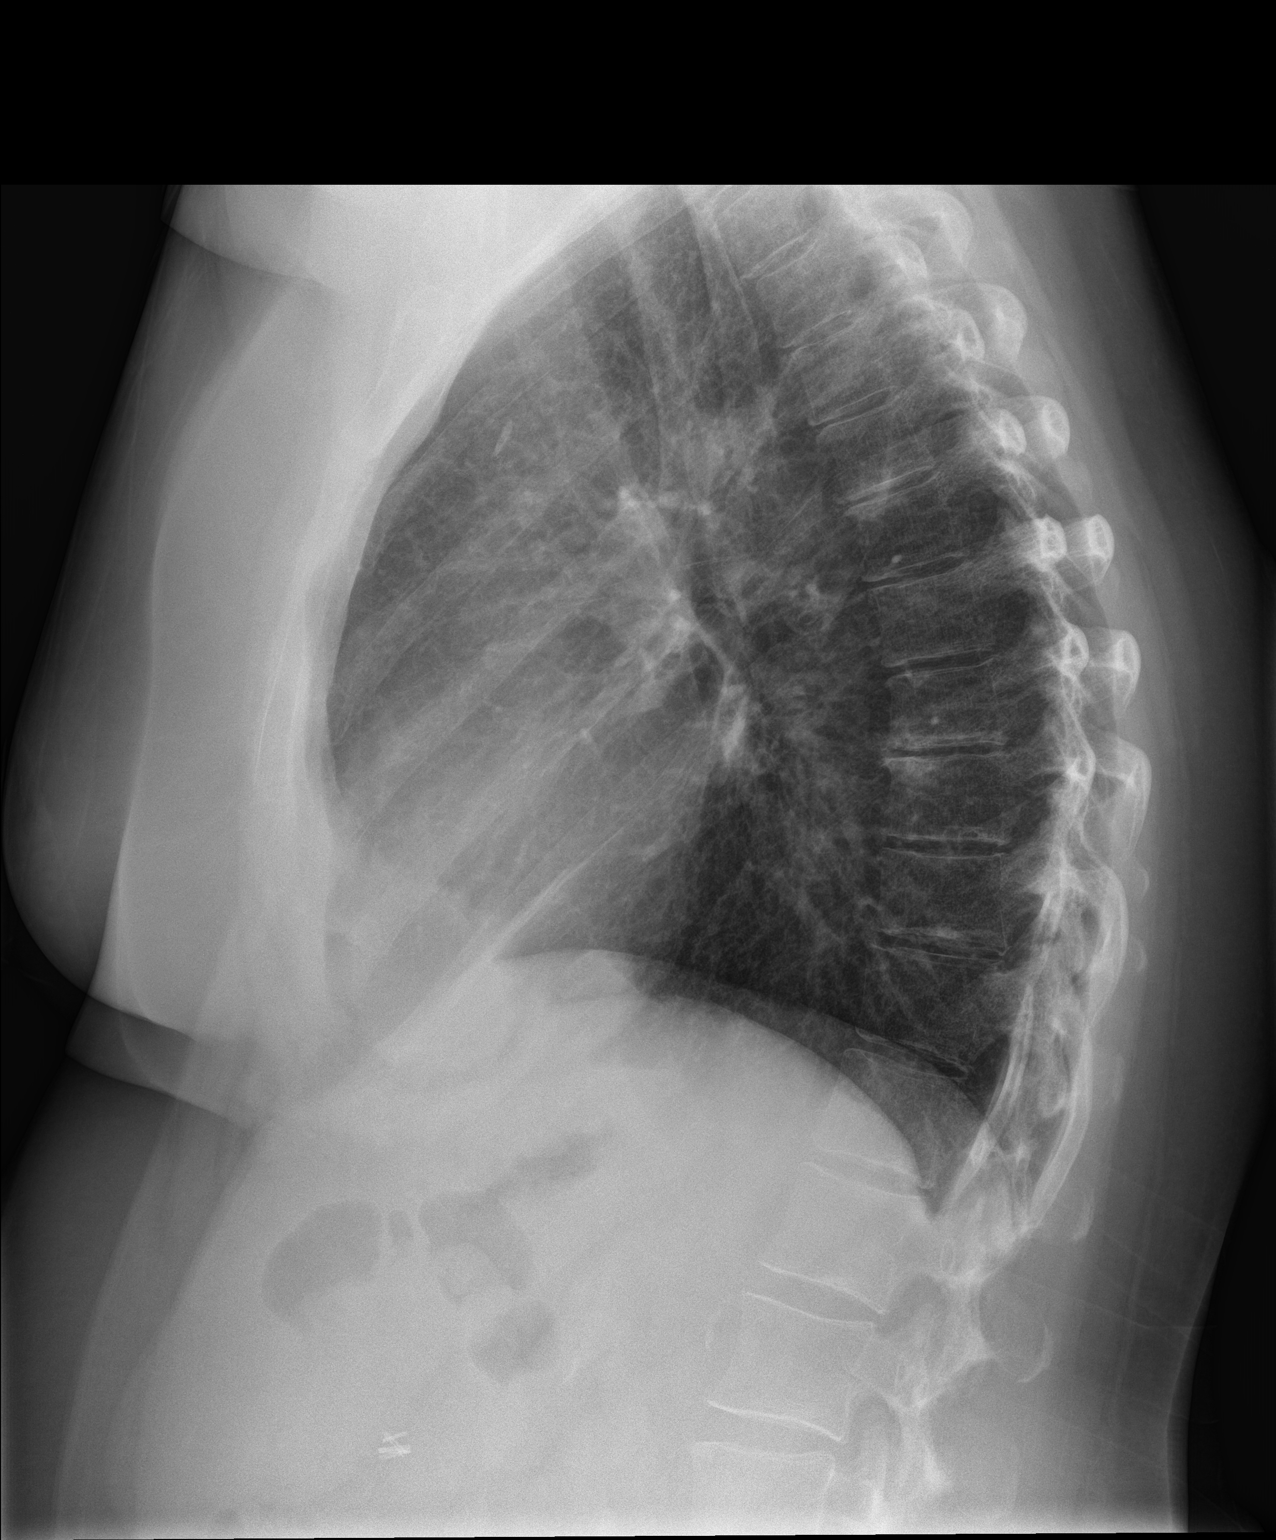

[2 of 2 positions shown; findings below may reference images not displayed]

FINDINGS: The lungs are well-expanded. There is no focal infiltrate. The
interstitial markings are coarse though stable. There is no pleural
effusion. The heart and pulmonary vascularity are normal. The
mediastinum is normal in width. There is no pleural effusion. There
is calcification in the wall of the aortic arch. The bony thorax
exhibits no acute abnormality.
IMPRESSION: Mild chronic bronchitic-smoking related changes, stable. There is no
acute cardiopulmonary abnormality.

Thoracic aortic atherosclerosis.

## 2019-08-15 ENCOUNTER — Encounter: Payer: Self-pay | Admitting: Student in an Organized Health Care Education/Training Program

## 2019-08-15 MED ORDER — PANTOPRAZOLE SODIUM 40 MG PO TBEC
40.00 | DELAYED_RELEASE_TABLET | ORAL | Status: DC
Start: 2019-08-15 — End: 2019-08-15

## 2019-08-15 MED ORDER — AMLODIPINE BESYLATE 10 MG PO TABS
10.00 | ORAL_TABLET | ORAL | Status: DC
Start: 2019-08-15 — End: 2019-08-15

## 2019-08-15 MED ORDER — HYDROXYZINE PAMOATE 25 MG PO CAPS
25.00 | ORAL_CAPSULE | ORAL | Status: DC
Start: ? — End: 2019-08-15

## 2019-08-15 MED ORDER — ATORVASTATIN CALCIUM 10 MG PO TABS
10.00 | ORAL_TABLET | ORAL | Status: DC
Start: 2019-08-14 — End: 2019-08-15

## 2019-08-15 MED ORDER — CHLORTHALIDONE 25 MG PO TABS
50.00 | ORAL_TABLET | ORAL | Status: DC
Start: 2019-08-15 — End: 2019-08-15

## 2019-08-15 MED ORDER — NICOTINE 14 MG/24HR TD PT24
1.00 | MEDICATED_PATCH | TRANSDERMAL | Status: DC
Start: 2019-08-15 — End: 2019-08-15

## 2019-08-15 MED ORDER — ROPINIROLE HCL 1 MG PO TABS
4.00 | ORAL_TABLET | ORAL | Status: DC
Start: 2019-08-14 — End: 2019-08-15

## 2019-08-15 MED ORDER — MONTELUKAST SODIUM 10 MG PO TABS
10.00 | ORAL_TABLET | ORAL | Status: DC
Start: 2019-08-14 — End: 2019-08-15

## 2019-08-15 MED ORDER — LISINOPRIL 20 MG PO TABS
20.00 | ORAL_TABLET | ORAL | Status: DC
Start: 2019-08-15 — End: 2019-08-15

## 2019-08-15 MED ORDER — GABAPENTIN 300 MG PO CAPS
600.00 | ORAL_CAPSULE | ORAL | Status: DC
Start: 2019-08-14 — End: 2019-08-15

## 2019-08-15 MED ORDER — ZOLPIDEM TARTRATE 5 MG PO TABS
10.00 | ORAL_TABLET | ORAL | Status: DC
Start: ? — End: 2019-08-15

## 2019-08-15 MED ORDER — ASPIRIN 81 MG PO CHEW
81.00 | CHEWABLE_TABLET | ORAL | Status: DC
Start: 2019-08-15 — End: 2019-08-15

## 2019-08-15 MED ORDER — LABETALOL HCL 100 MG PO TABS
200.00 | ORAL_TABLET | ORAL | Status: DC
Start: 2019-08-14 — End: 2019-08-15

## 2019-08-15 MED ORDER — LABETALOL HCL 5 MG/ML IV SOLN
10.00 | INTRAVENOUS | Status: DC
Start: ? — End: 2019-08-15

## 2019-08-15 MED ORDER — CLOPIDOGREL BISULFATE 75 MG PO TABS
75.00 | ORAL_TABLET | ORAL | Status: DC
Start: 2019-08-15 — End: 2019-08-15

## 2019-08-15 MED ORDER — ALBUTEROL SULFATE HFA 108 (90 BASE) MCG/ACT IN AERS
2.00 | INHALATION_SPRAY | RESPIRATORY_TRACT | Status: DC
Start: ? — End: 2019-08-15

## 2019-08-16 ENCOUNTER — Other Ambulatory Visit: Payer: Self-pay

## 2019-08-16 ENCOUNTER — Ambulatory Visit
Payer: Medicare Other | Attending: Student in an Organized Health Care Education/Training Program | Admitting: Student in an Organized Health Care Education/Training Program

## 2019-08-16 ENCOUNTER — Encounter: Payer: Self-pay | Admitting: Student in an Organized Health Care Education/Training Program

## 2019-08-16 DIAGNOSIS — M5416 Radiculopathy, lumbar region: Secondary | ICD-10-CM | POA: Diagnosis not present

## 2019-08-16 DIAGNOSIS — G8929 Other chronic pain: Secondary | ICD-10-CM

## 2019-08-16 DIAGNOSIS — Z981 Arthrodesis status: Secondary | ICD-10-CM

## 2019-08-16 NOTE — Progress Notes (Signed)
Done per Coralyn Helling 08/16/2019.

## 2019-08-16 NOTE — Progress Notes (Signed)
Patient: Katrina [name]  Service Category: E/M  Provider: Gillis Santa, MD  DOB: 19-Sep-1960  DOS: 08/16/2019  Location: Office  MRN: [id number]  Setting: Ambulatory outpatient  Referring Provider: [name], FNP  Type: Established Patient  Specialty: Interventional Pain Management  PCP: [name], FNP  Location: Home  Delivery: TeleHealth     Virtual Encounter - Pain Management PROVIDER NOTE: Information contained herein reflects review and annotations entered in association with encounter. Interpretation of such information and data should be left to medically-trained personnel. Information provided to patient can be located elsewhere in the medical record under "Patient Instructions". Document created using STT-dictation technology, any transcriptional errors that may result from process are unintentional.    Contact & Pharmacy Preferred: [phone] Home: [phone] (home) Mobile: [phone] (mobile) E-mail: [email or web address] [place] [place] [address] Phone: [phone] Fax: [phone]   Pre-screening  Ms. Seeman offered "in-person" vs "virtual" encounter. She indicated preferring virtual for this encounter.   Reason COVID-19*  Social distancing based on CDC and AMA recommendations.   I contacted ALIANA KREISCHER on 08/16/2019 via telephone.      I clearly identified myself as Gillis Santa, MD. I verified that I was speaking with the correct person using two identifiers (Name: LACOSTA HARGAN, and date of birth: 1960/08/17).  This visit was completed via telephone due to the restrictions of the COVID-19 pandemic. All issues as above were discussed and addressed but no physical exam was performed. If it was felt that the patient should be evaluated in the office, they were directed there. The patient verbally consented to this visit. Patient was  unable to complete an audio/visual visit due to Technical difficulties and/or Lack of internet. Due to the catastrophic nature of the COVID-19 pandemic, this visit was done through audio contact only.  Location of the patient: home address (see Epic for details)  Location of the provider: office  Consent I sought verbal advanced consent from Katrina [name] for virtual visit interactions. I informed Katrina [name] of possible security and privacy concerns, risks, and limitations associated with providing "not-in-person" medical evaluation and management services. I also informed Katrina [name] of the availability of "in-person" appointments. Finally, I informed her that there would be a charge for the virtual visit and that she could be  personally, fully or partially, financially responsible for it. Ms. Ohmer expressed understanding and agreed to proceed.   Historic Elements   Katrina [name] is a 59 y.o. year old, female patient evaluated today after her last contact with our practice on 06/21/2019. Ms. Standen  has a past medical history of Allergy, Anemia [phone] ), Anxiety, Arthritis, Asthma, Chronic bronchitis ([place]), Chronic kidney disease, Chronic lower back pain, Claustrophobia, COPD (chronic obstructive pulmonary disease) ([place]), Daily headache, Depression, Diverticulitis, Family history of adverse reaction to anesthesia, GERD (gastroesophageal reflux disease), Heart murmur, History of blood transfusion (1975-1976 "several"), History of colitis, History of colon polyps, History of gastric ulcer, History of hiatal hernia, History of MRSA infection (2017), Hyperlipidemia, Hypertension, Insomnia, Iron deficiency anemia, Lung nodules, Migraine, MS (multiple sclerosis) ([place]), Noncompliance, Osteoporosis, Peripheral neuropathy, PTSD (post-traumatic stress disorder) (dx'd 2016/2017), Restless leg syndrome, Stroke ([place]) (2015; 2016; 2017), and Type II diabetes mellitus ([place]). She also  has a past  surgical history that includes Ankle surgery (Bilateral, 1993; 1995 X2); Colonoscopy; Esophagogastroduodenoscopy; Radiology with anesthesia (N/A,  01/15/2016); Radiology with anesthesia (N/A, 06/29/2016); Breast lumpectomy (Left); Posterior lumbar fusion (2005); Tumor excision (1998); Nasal sinus surgery (1975); Nasal reconstruction (1976); Abdominal hysterectomy (12/1987); Dilation and curettage of uterus; Tubal ligation; Tonsillectomy (1992); Adenoidectomy (1975); Appendectomy (1989); Laparoscopic cholecystectomy (1996); Cardiac catheterization (N/A, 01/31/2015); ABDOMINAL AORTOGRAM (N/A, 12/03/2016); RENAL ANGIOGRAPHY (N/A, 12/03/2016); and Breast excisional biopsy (Left). Ms. Weatherholtz has a current medication list which includes the following prescription(s): amlodipine, aspirin-acetaminophen-caffeine, clopidogrel, dexlansoprazole, epinephrine, hydralazine, labetalol, metformin, montelukast, rizatriptan, ropinirole, rosuvastatin, tizanidine, zolpidem, and pregabalin. She  reports that she has been smoking cigarettes. She started smoking about 48 years ago. She has a 45.00 pack-year smoking history. She has never used smokeless tobacco. She reports that she does not drink alcohol or use drugs. Katrina [name] is allergic to cymbalta [duloxetine hcl]; other; penicillins; zithromax [azithromycin]; aspirin; pineapple; strawberry extract; spironolactone; aspartame and phenylalanine; mushroom extract complex; and nicardipine.   HPI  Today, she is being contacted for medication management.    Patient was unable to tolerate Lyrica due to shortness of breath and heart palpitations.  She had to discontinue this medication.  She had similar side effects with Cymbalta but not as intense.  She has also tried gabapentin in the past which was not effective.  We are avoiding opioid therapy.  Her lumbar MRI shows stable L5-S1 fusion and left L3 nerve root irritation.  At her previous visit, lumbar epidural steroid injection was  recommended along with possible spinal cord stimulator trial.  She would now like to try the lumbar epidural steroid injection.  She is currently on Plavix.  We will need clearance from her cardiologist to discontinue this medication 7 days prior to her scheduled spinal procedure.  Laboratory Chemistry Profile   Renal Lab Results  Component Value Date   BUN 16 06/11/2019   CREATININE 0.78 24/40/1027   BCR NOT APPLICABLE 25/36/6440   GFRAA >60 06/11/2019   GFRNONAA >60 06/11/2019    Hepatic Lab Results  Component Value Date   AST 12 (L) 06/11/2019   ALT 12 06/11/2019   ALBUMIN 3.5 06/11/2019   ALKPHOS 72 06/11/2019   LIPASE 29 12/20/2017    Electrolytes Lab Results  Component Value Date   NA 141 06/11/2019   K 3.6 06/11/2019   CL 109 06/11/2019   CALCIUM 8.7 (L) 06/11/2019   MG 2.2 02/06/2018   PHOS 2.9 02/06/2018    Bone No results found for: VD25OH, VD125OH2TOT, HK7425ZD6, LO7564PP2, 25OHVITD1, 25OHVITD2, 25OHVITD3, TESTOFREE, TESTOSTERONE  Inflammation (CRP: Acute Phase) (ESR: Chronic Phase) Lab Results  Component Value Date   CRP 2.1 (H) 03/20/2014      Note: Above Lab results reviewed.  Imaging  CT Head Wo Contrast CLINICAL DATA:  59 year old female with TIA.  EXAM: CT HEAD WITHOUT CONTRAST  TECHNIQUE: Contiguous axial images were obtained from the base of the skull through the vertex without intravenous contrast.  COMPARISON:  Head CT dated 06/19/2018.  FINDINGS: Brain: The ventricles and sulci appropriate size for patient's age. The gray-white matter discrimination is preserved. There is no acute intracranial hemorrhage. No mass effect or midline shift no extra-axial fluid collection.  Vascular: No hyperdense vessel or unexpected calcification.  Skull: Normal. Negative for fracture or focal lesion.  Sinuses/Orbits: Opacification of several ethmoid air cells. No air-fluid level. The remainder of the visualized paranasal sinuses and mastoid air  cells are clear.  Other: None  IMPRESSION: Unremarkable noncontrast CT of the brain.  Electronically Signed   By: Anner Crete M.D.   On: 06/11/2019  20:57 DG Chest Portable 1 View CLINICAL DATA:  Nonproductive cough. Chills. Shortness of breath. Headache. Chest pain.  EXAM: PORTABLE CHEST 1 VIEW  COMPARISON:  Chest x-ray 11/27/2018 and chest CT dated 03/22/2018  FINDINGS: The heart size and pulmonary vascularity are normal and the lungs are clear. Slight prominence of the left pericardial fat pad, unchanged.  No significant bone abnormality.  IMPRESSION: No active disease.  Electronically Signed   By: Lorriane Shire M.D.   On: 06/11/2019 20:50  Assessment  The primary encounter diagnosis was Lumbar radiculopathy (left L3, hx of L5/S1 fusion). Diagnoses of Chronic radicular lumbar pain and History of lumbar fusion were also pertinent to this visit.  Plan of Care   Ms. URIJAH RAYNOR has a current medication list which includes the following long-term medication(s): amlodipine, dexlansoprazole, hydralazine, labetalol, rizatriptan, and pregabalin.  Plan for lumbar epidural steroid injection at left L3-L4 for L3 radiculopathy.  Stop Lyrica due to side effects.  We will plan on doing injection under sedation.  Need clearance from cardiology to discontinue Plavix 7 days prior to schedule neuraxial procedure.  Risks and benefits of epidural steroid injection were discussed in great detail and patient would like to proceed.  Orders:  Orders Placed This Encounter  Procedures  . Lumbar Epidural Injection    Standing Status:   Future    Standing Expiration Date:   09/16/2019    Scheduling Instructions:     Procedure: Interlaminar Lumbar Epidural Steroid injection (LESI)            Laterality: Midline LEFT L3/4     Sedation: with.     Timeframe: ASAA'     Must stop Plavix 7 days prior    Order Specific Question:   Where will this procedure be performed?    Answer:    [facility]   Follow-up plan:   Return in about 2 weeks (around 08/30/2019) for Left L3 ESI , with sedation (stop Plavix 7 days prior) .    Recent Visits Date Type Provider Dept  06/21/19 Office Visit Gillis Santa, MD [facility]  Showing recent visits within past 90 days and meeting all other requirements   Today's Visits Date Type Provider Dept  08/16/19 Office Visit Gillis Santa, MD [facility]  Showing today's visits and meeting all other requirements   Future Appointments No visits were found meeting these conditions.  Showing future appointments within next 90 days and meeting all other requirements   I discussed the assessment and treatment plan with the patient. The patient was provided an opportunity to ask questions and all were answered. The patient agreed with the plan and demonstrated an understanding of the instructions.  Duration of encounter: 25 minutes.  Note by: Gillis Santa, MD Date: 08/16/2019; Time: 10:53 AM

## 2019-08-17 ENCOUNTER — Telehealth: Payer: Self-pay

## 2019-08-17 NOTE — Telephone Encounter (Signed)
Called patient and informed her that we had gotten approval to stop Plavix for 7 days.  Patient states she is out of town and would rather not schedule her procedure at this time.  Reminded her that when she schedules it that she needs to make sure it is at least 7 days out so that she can stop her Plavix.  Also gave pre procedure instructions for sedation.  Patient states understanding.

## 2019-08-24 IMAGING — CT CT HEAD W/O CM
3 series · 15 of 47 positions shown, 18 images · non-contrast
Comparison: 08/20/2017 CT and MRI.

CLINICAL DATA: Headache of sudden onset. Hypertension left-sided
facial droop.

EXAM:
CT HEAD WITHOUT CONTRAST
TECHNIQUE: Contiguous axial images were obtained from the base of the skull
through the vertex without intravenous contrast.

[Series 3: head 5.0 h30s · axial · 0.41mm/px · z∈[-125,+15]mm · 9 of 34 slices shown, 12 images]
[im 3/34  brain]
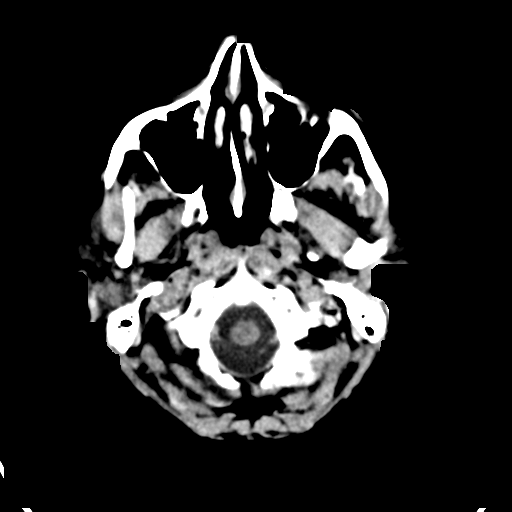
[im 3/34  bone]
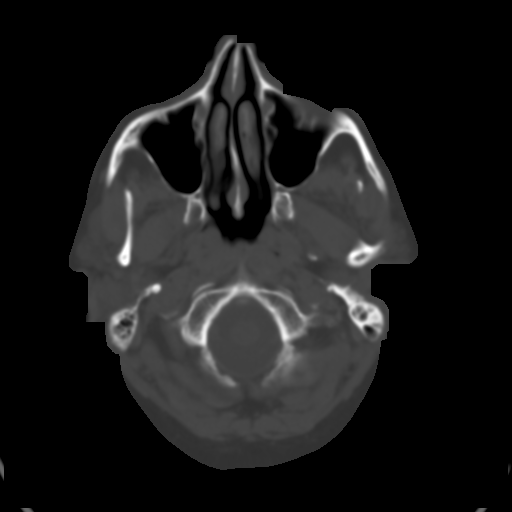
[im 6/34  brain]
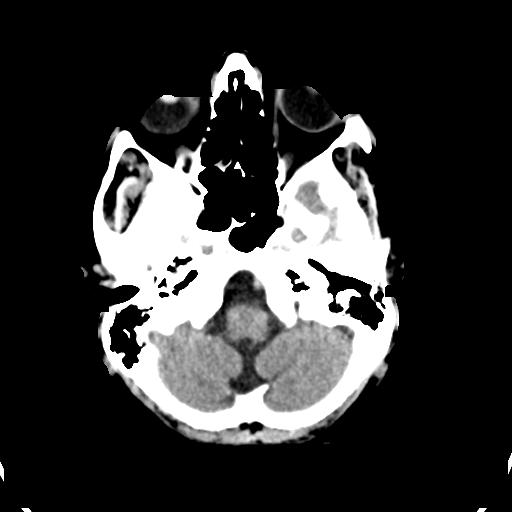
[im 10/34  brain]
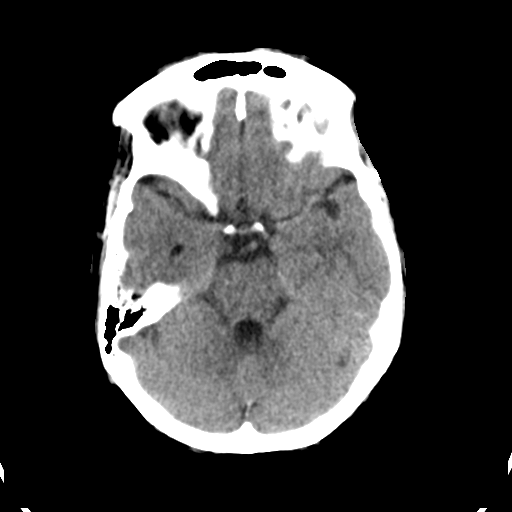
[im 13/34  brain]
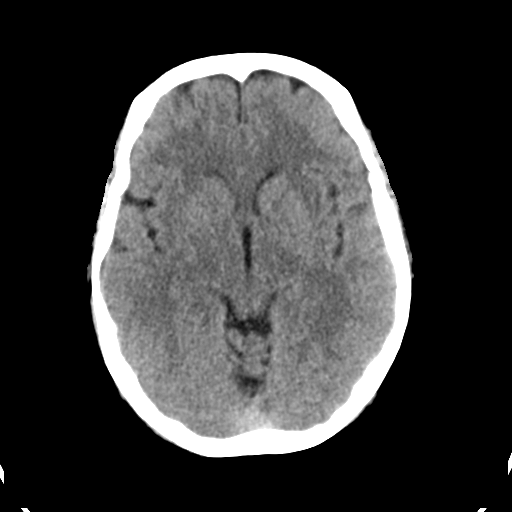
[im 18/34  brain]
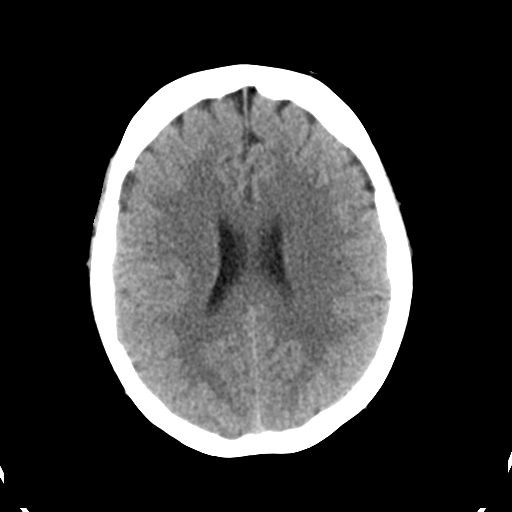
[im 18/34  bone]
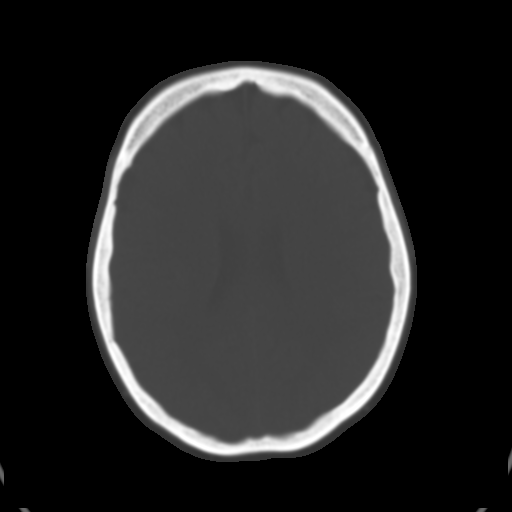
[im 21/34  brain]
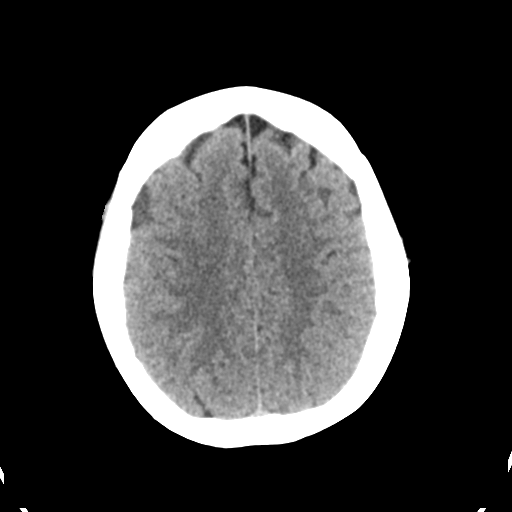
[im 24/34  brain]
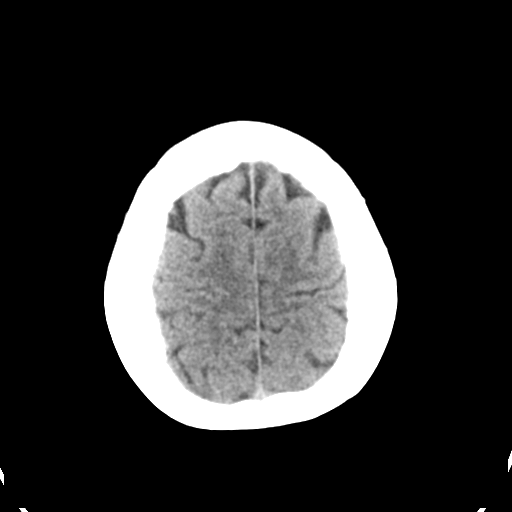
[im 28/34  brain]
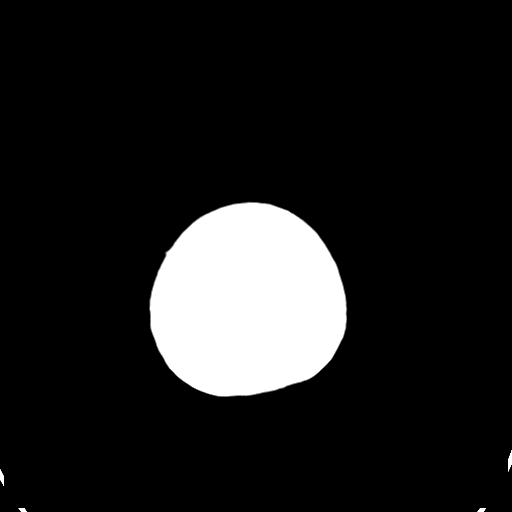
[im 31/34  brain]
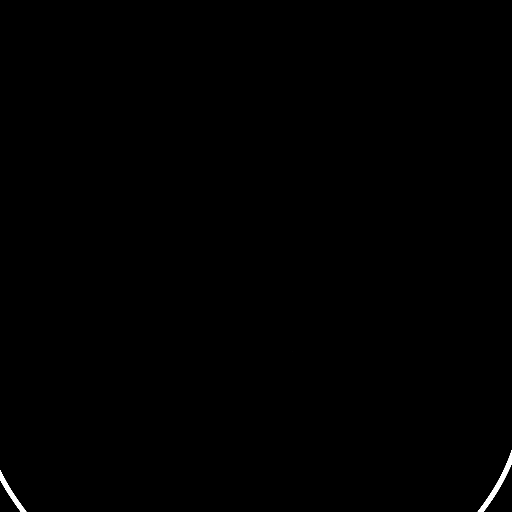
[im 31/34  bone]
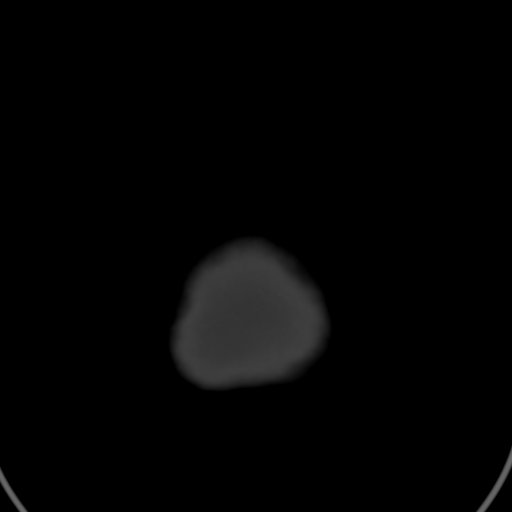

[Series 5: head 3.0 mpr cor · coronal · 0.32mm/px · 3 of 69 slices shown]
[im 23/69  brain]
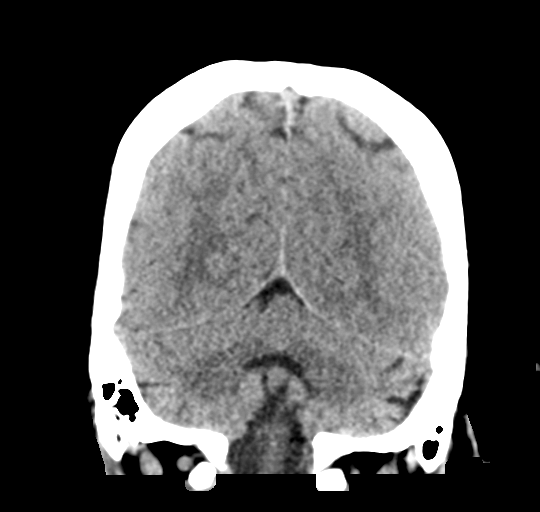
[im 31/69  brain]
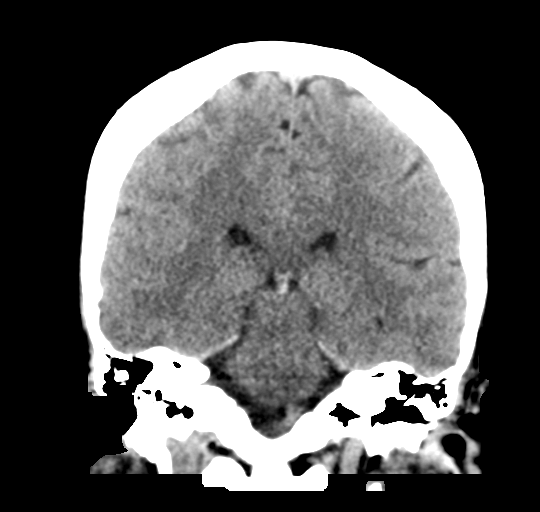
[im 38/69  brain]
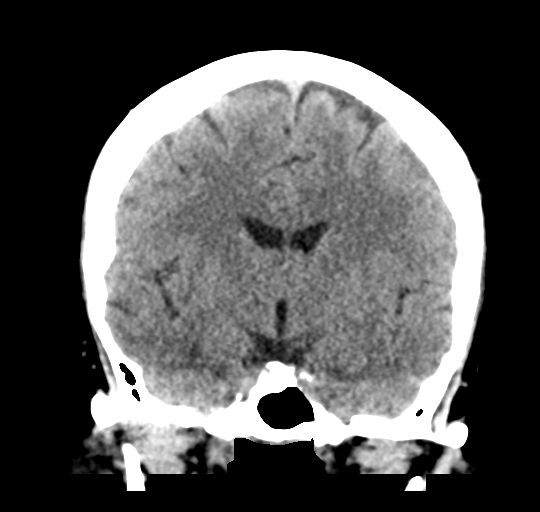

[Series 6: head 3.0 mpr sag · sagittal · 0.34mm/px · 3 of 61 slices shown]
[im 21/61  brain]
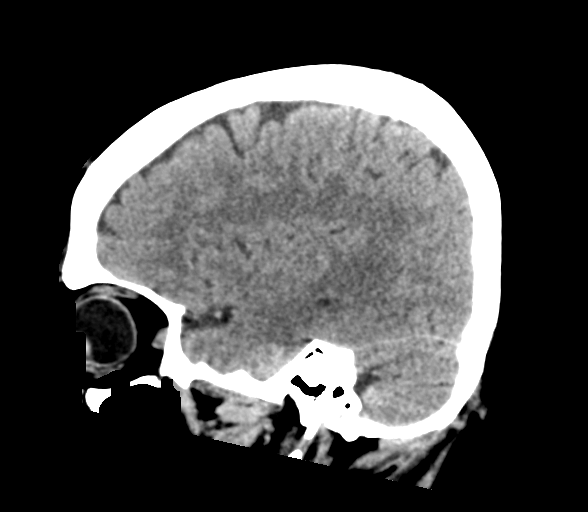
[im 31/61  brain]
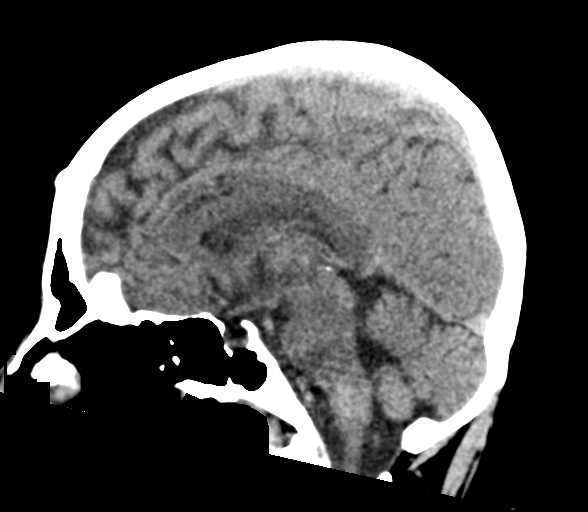
[im 41/61  brain]
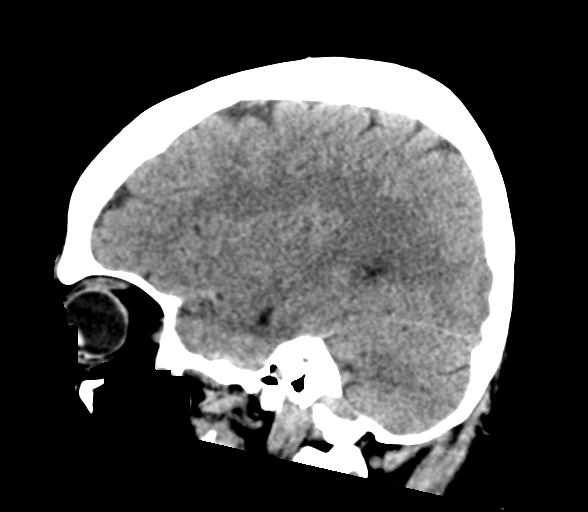

[15 of 47 positions shown; findings below may reference images not displayed]

FINDINGS: BRAIN: The ventricles and sulci are normal. No acute
intraparenchymal hemorrhage, mass effect nor midline shift. No acute
large vascular territory infarcts. Chronic small vessel ischemic
disease of periventricular white matter. Subtle bilateral posterior
occipital white matter changes raise the possibility of
PRES/hypertensive encephalopathy. The basal ganglia are
unremarkable. No abnormal extra-axial fluid collections. Basal
cisterns are not effaced and midline. The brainstem and cerebellar
hemispheres are without acute abnormalities.

VASCULAR: No hyperdense vessel sign.

SKULL/SOFT TISSUES: No skull fracture. No significant soft tissue
swelling.

ORBITS/SINUSES: The included ocular globes and orbital contents are
normal.The mastoid air cells are clear. The included paranasal
sinuses are well-aerated.

OTHER: Chronic small mastoid effusion on the right, partially
opacifying the inferior right mastoid air cells.
IMPRESSION: Indistinct subcortical appearing gray-white matter junctures
involving the posterior occipital lobes bilaterally that may
represent stigmata of hypertensive encephalopathy/PRES.

Otherwise chronic small vessel ischemic disease and small right
mastoid effusion.

## 2019-08-24 IMAGING — CT CT RENAL STONE PROTOCOL
2 of 4 series · 16 of 46 positions shown, 18 images · non-contrast
Comparison: 11/02/2017 CT abdomen and pelvis

CLINICAL DATA: 56 y/o  F; left-sided abdominal pain.

EXAM:
CT ABDOMEN AND PELVIS WITHOUT CONTRAST
TECHNIQUE: Multidetector CT imaging of the abdomen and pelvis was performed
following the standard protocol without IV contrast.

[Series 3: ap without · axial · non-contrast · 0.67mm/px · z∈[+703,+1128]mm · 13 of 95 slices shown, 15 images]
[im 5/95  soft-tissue]
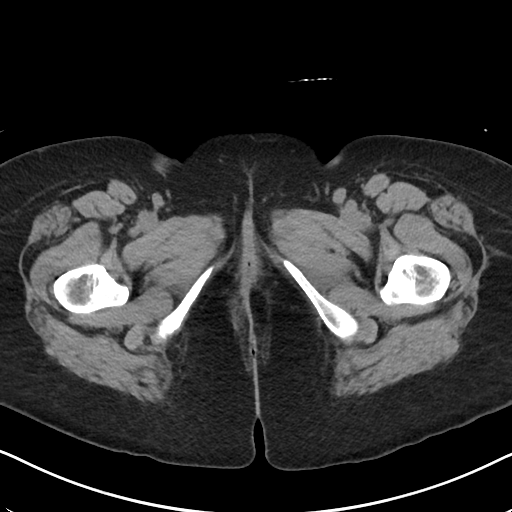
[im 5/95  bone]
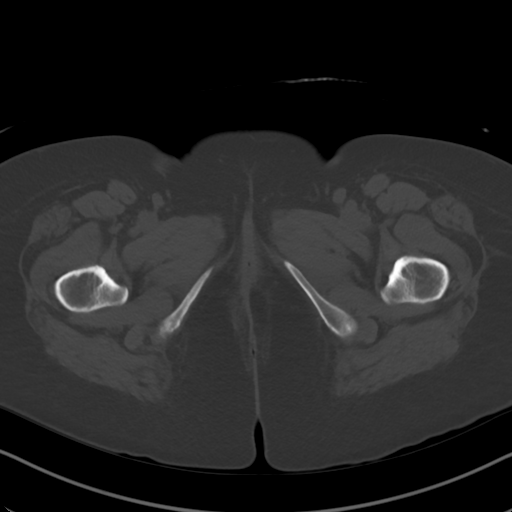
[im 15/95  soft-tissue]
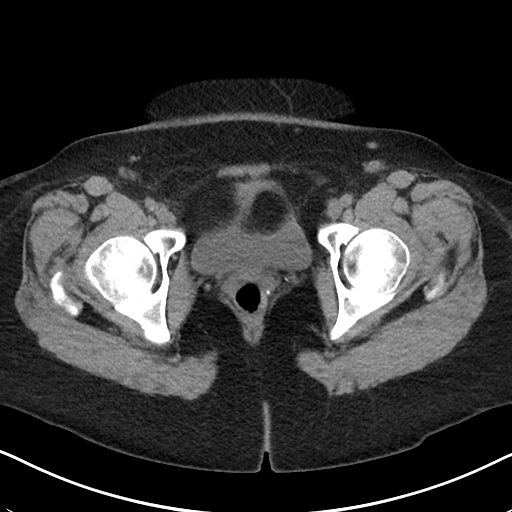
[im 19/95  soft-tissue]
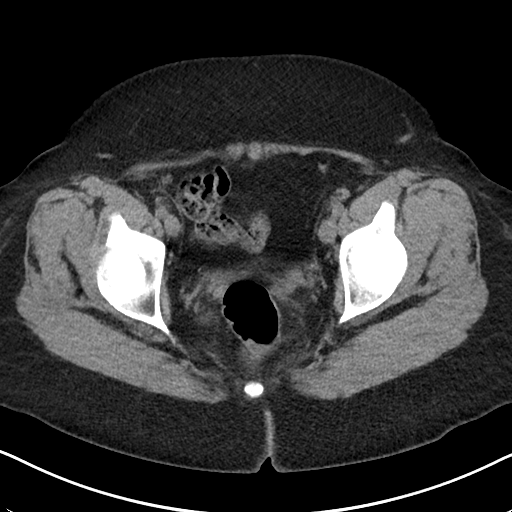
[im 29/95  soft-tissue]
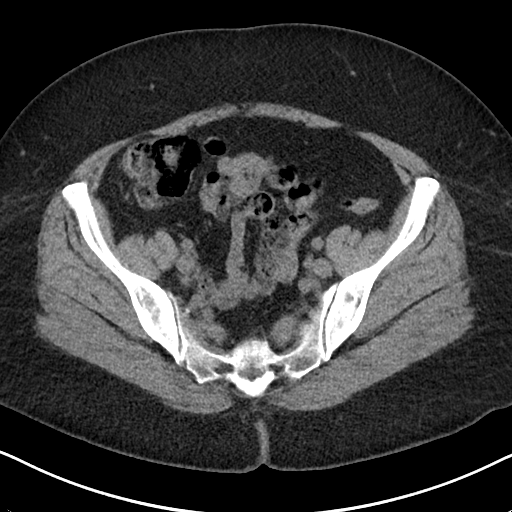
[im 33/95  soft-tissue]
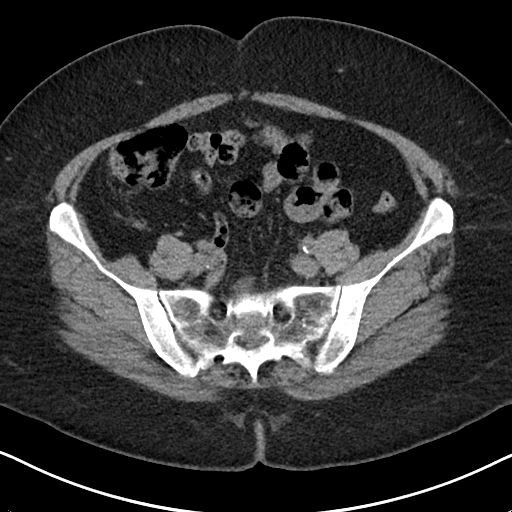
[im 43/95  soft-tissue]
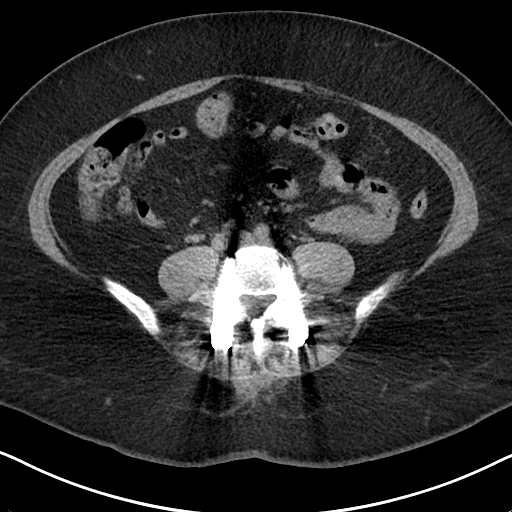
[im 48/95  soft-tissue]
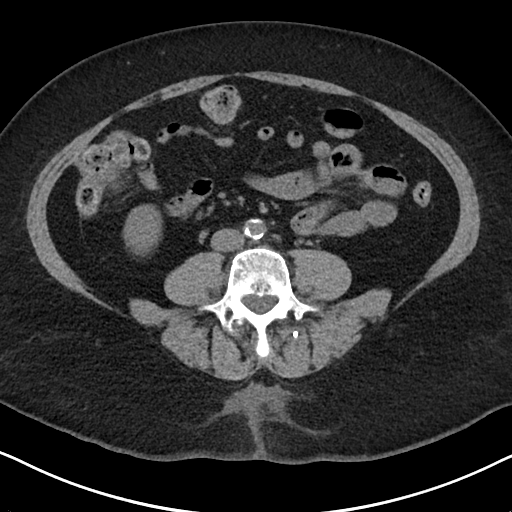
[im 52/95  soft-tissue]
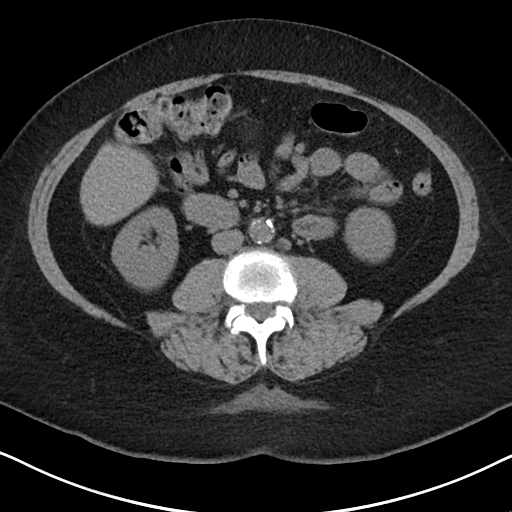
[im 62/95  soft-tissue]
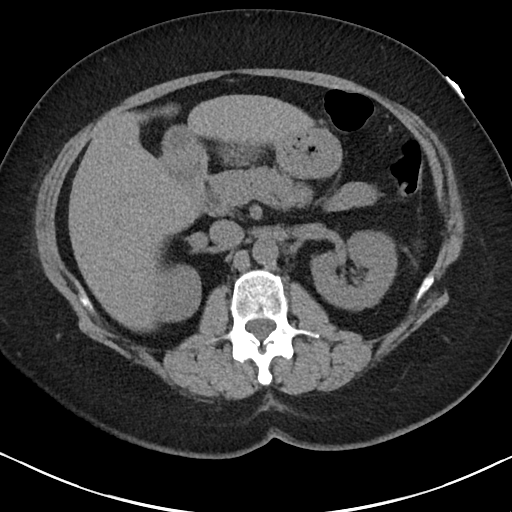
[im 62/95  bone]
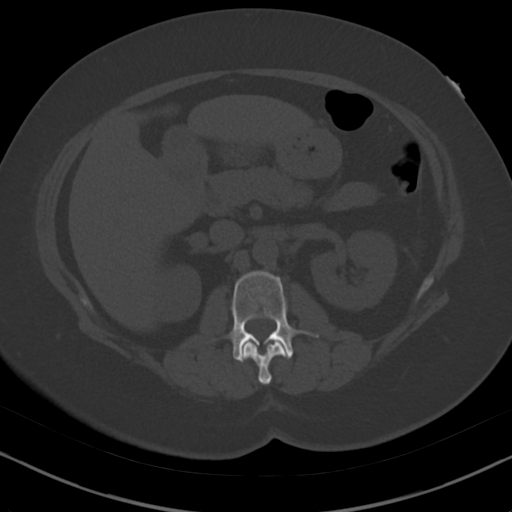
[im 66/95  soft-tissue]
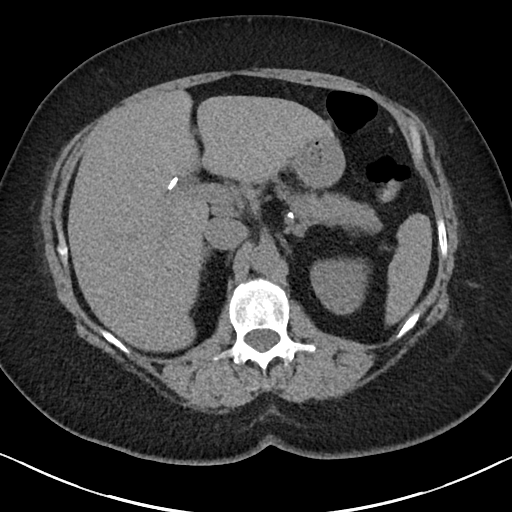
[im 76/95  soft-tissue]
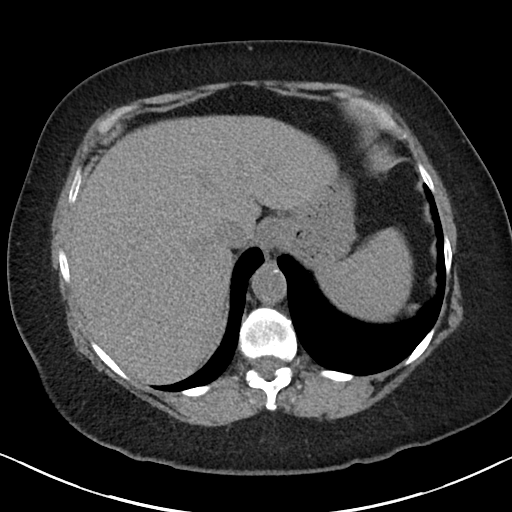
[im 80/95  soft-tissue]
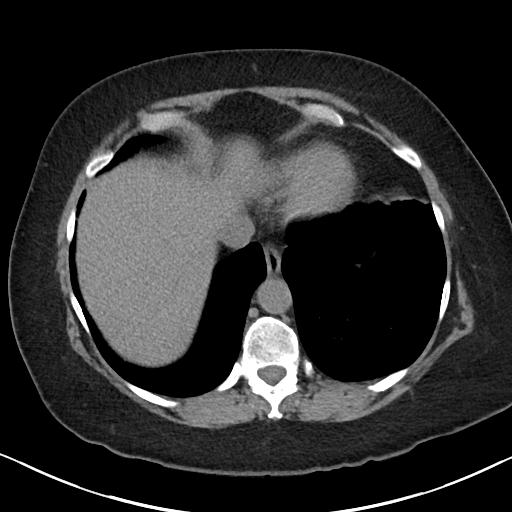
[im 90/95  soft-tissue]
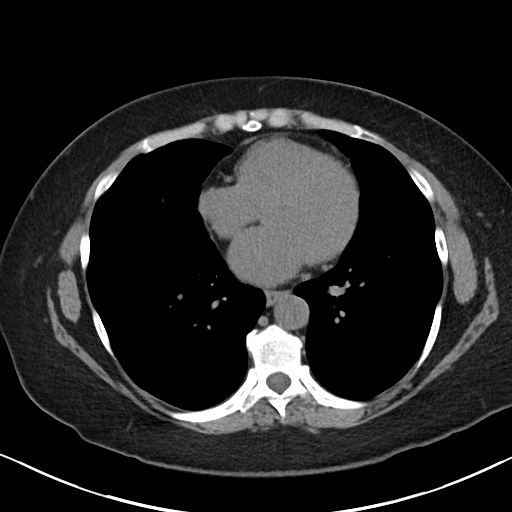

[Series 6: cor · coronal · 0.71mm/px · 3 of 84 slices shown]
[im 28/84  soft-tissue]
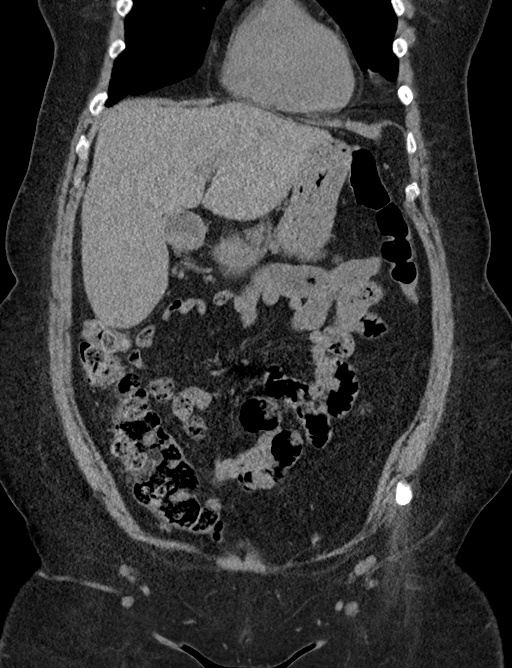
[im 37/84  soft-tissue]
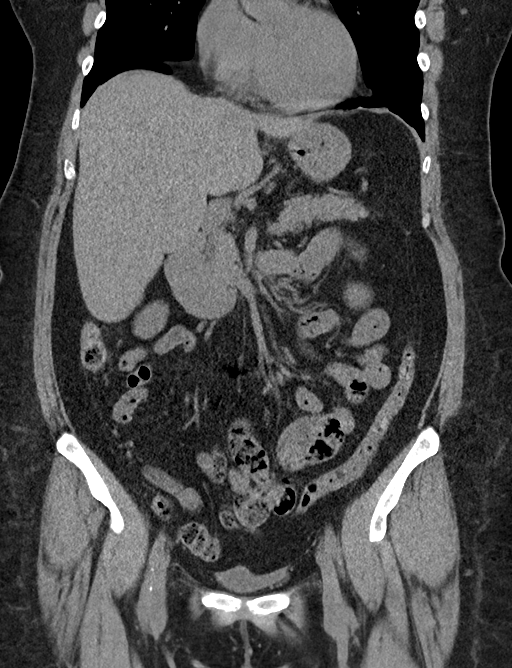
[im 47/84  soft-tissue]
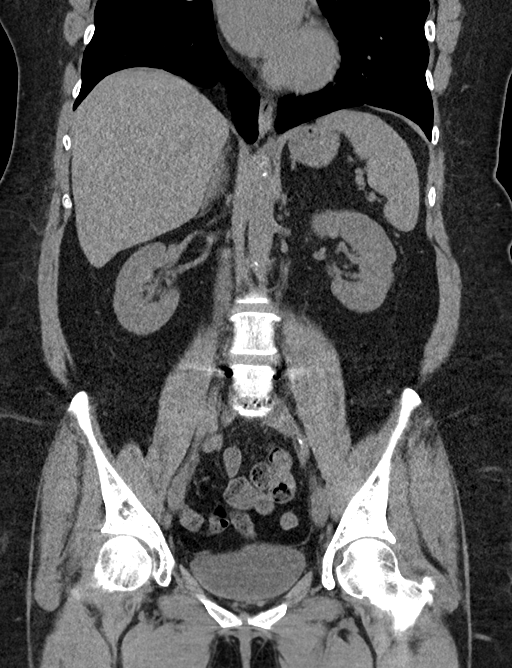

[16 of 46 positions shown; findings below may reference images not displayed]

FINDINGS: Lower chest: Stable 2-3 mm perifissural nodules at the lung bases,
likely intrapulmonary lymph nodes.

Hepatobiliary: No focal liver abnormality is seen. Status post
cholecystectomy. No biliary dilatation.

Pancreas: Unremarkable. No pancreatic ductal dilatation or
surrounding inflammatory changes.

Spleen: Normal in size without focal abnormality.

Adrenals/Urinary Tract: Adrenal glands are unremarkable. Kidneys are
normal, without renal calculi, focal lesion, or hydronephrosis.
Bladder is unremarkable.

Stomach/Bowel: Stomach is within normal limits. Appendix not
identified, no pericecal inflammation. No evidence of bowel wall
thickening, distention, or inflammatory changes.

Vascular/Lymphatic: Aortic atherosclerosis. No enlarged abdominal or
pelvic lymph nodes.

Reproductive: Status post hysterectomy. No adnexal masses.

Other: No abdominal wall hernia or abnormality. No abdominopelvic
ascites.

Musculoskeletal: L5-S1 posterior instrumented fusion with vertical
rods and paired pedicle screws as well as and interbody prosthesis.
No periprosthetic lucency or fracture identified.
IMPRESSION: No acute process of the abdomen or pelvis. No hydronephrosis or
urinary stone disease identified. Stable CT of the abdomen and
pelvis.

By: Georgiana Jim M.D.

## 2019-08-24 IMAGING — CR DG CHEST 2V
2 series · 2 of 2 positions shown · non-contrast
Comparison: 11/23/2017

CLINICAL DATA: Cough, chest pain and abdominal pain.

EXAM:
CHEST - 2 VIEW

[chest pa]
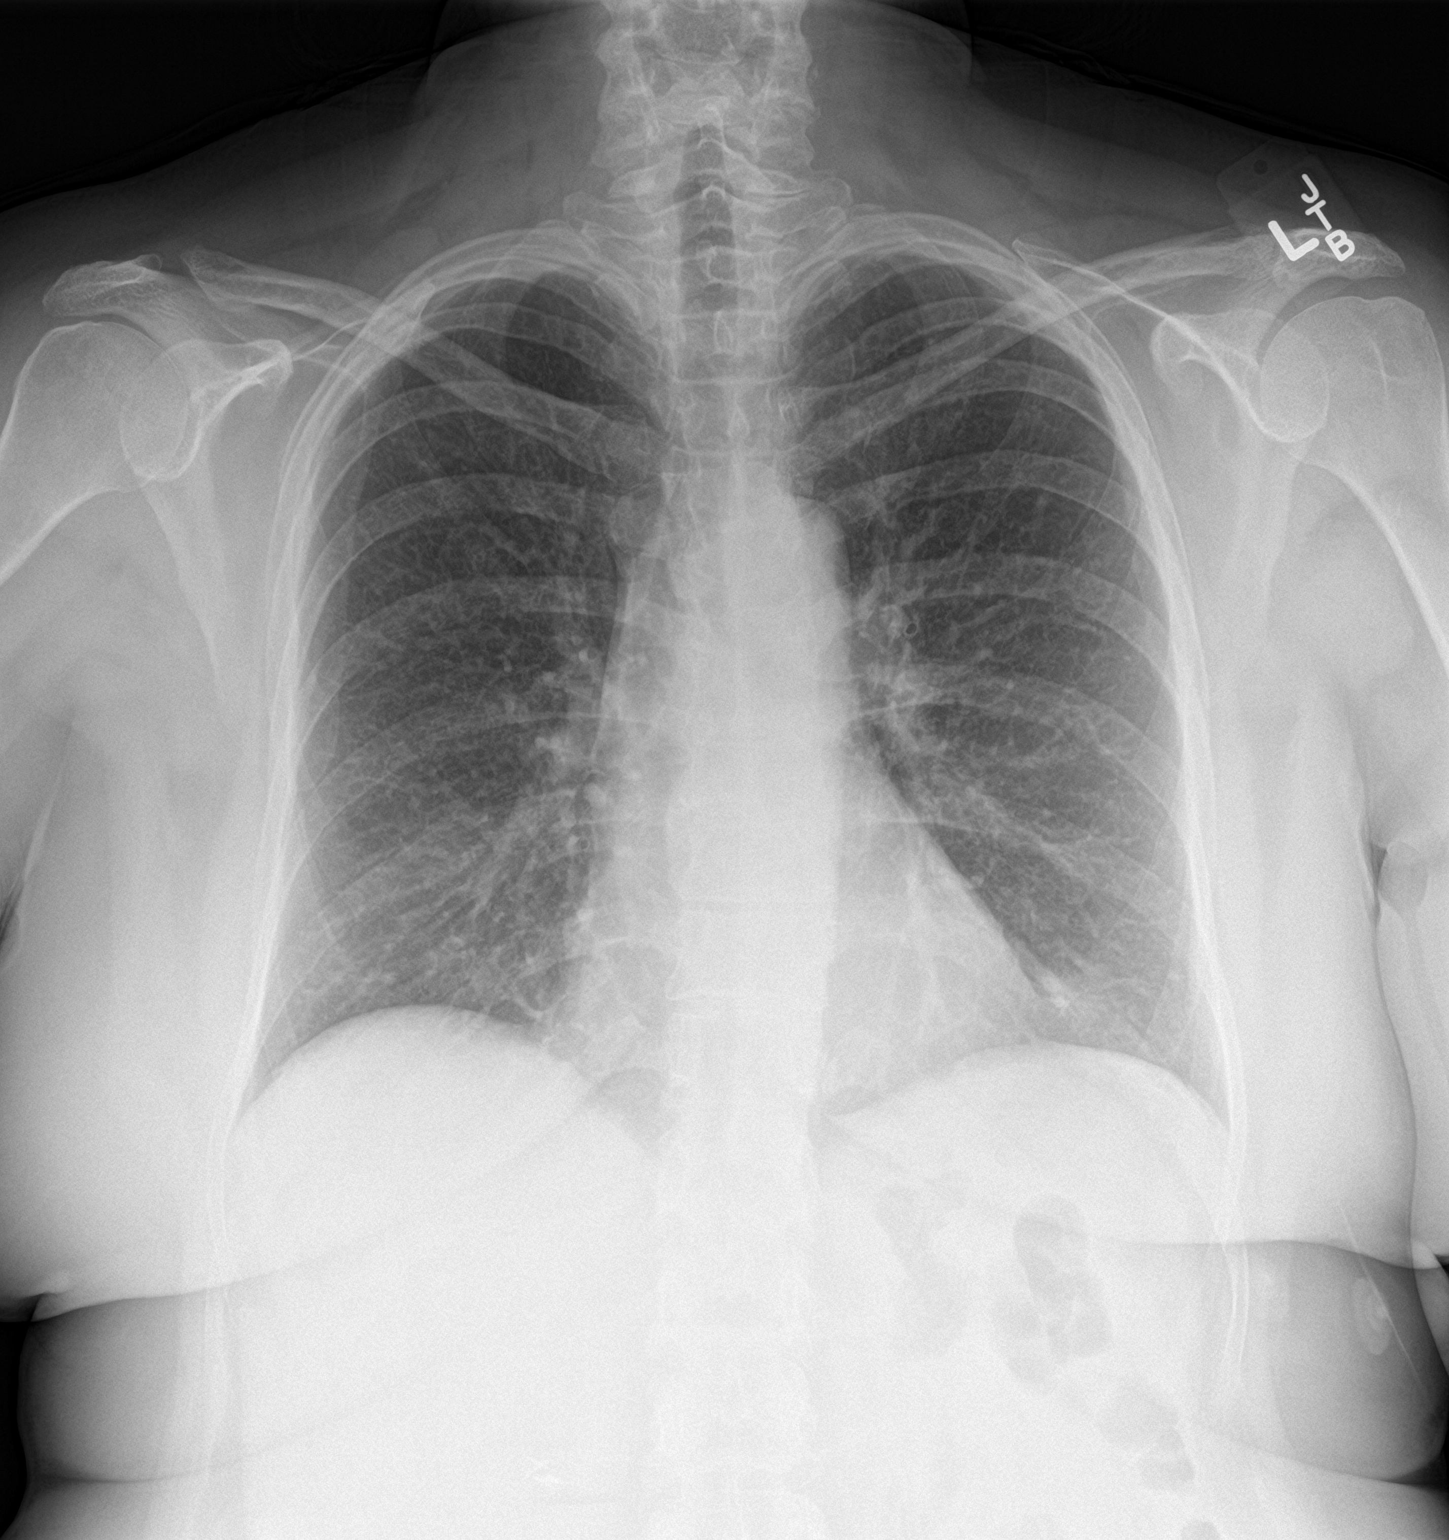

[chest lat]
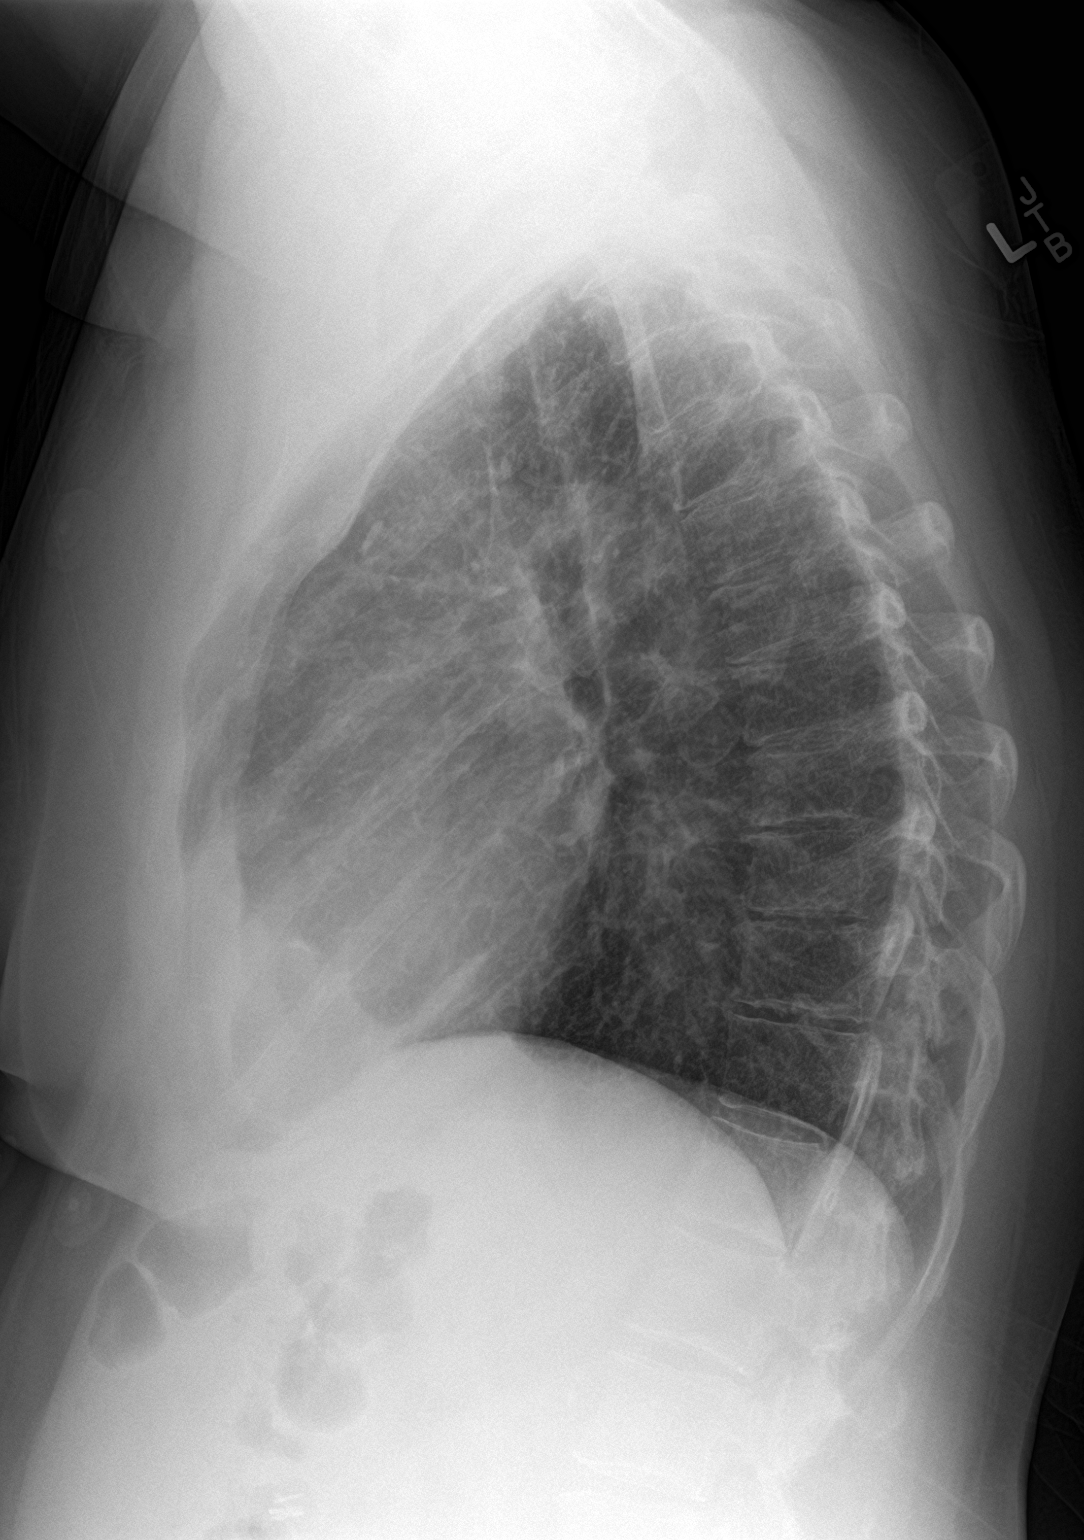

[2 of 2 positions shown; findings below may reference images not displayed]

FINDINGS: The heart size and mediastinal contours are within normal limits.
There is no evidence of pulmonary edema, consolidation,
pneumothorax, nodule or pleural fluid. The visualized skeletal
structures are unremarkable.
IMPRESSION: No active cardiopulmonary disease.

## 2019-08-30 ENCOUNTER — Ambulatory Visit: Payer: Medicare Other | Attending: Internal Medicine

## 2019-08-30 DIAGNOSIS — Z23 Encounter for immunization: Secondary | ICD-10-CM

## 2019-08-30 NOTE — Progress Notes (Signed)
   Covid-19 Vaccination Clinic  Name:  Katrina [name]    MRN: [id number] DOB: December 26, 1960  08/30/2019  [name] was observed post Covid-19 immunization for 30 minutes based on pre-vaccination screening without incident. She was provided with Vaccine Information Sheet and instruction to access the V-Safe system.   [name] was instructed to call 911 with any severe reactions post vaccine: Marland Kitchen Difficulty breathing  . Swelling of face and throat  . A fast heartbeat  . A bad rash all over body  . Dizziness and weakness   Immunizations Administered    Name Date Dose VIS Date Route   Pfizer COVID-19 Vaccine 08/30/2019  1:51 PM 0.3 mL 05/18/2019 Intramuscular   Manufacturer: [place]   Lot: [id number]   [place]: [id number]

## 2019-09-26 ENCOUNTER — Ambulatory Visit: Payer: Medicare Other | Attending: Internal Medicine

## 2019-09-26 DIAGNOSIS — Z23 Encounter for immunization: Secondary | ICD-10-CM

## 2019-09-26 NOTE — Progress Notes (Signed)
   Covid-19 Vaccination Clinic  Name:  Katrina [name]    MRN: [id number] DOB: 10-05-1960  09/26/2019  Katrina [name] was observed post Covid-19 immunization for 30 minutes based on pre-vaccination screening .  During the observation period, she experienced an adverse reaction with the following symptoms:  iitching on arms that quickly spread to face, neck and legs.  Assessment : Time of assessment 11:50  Am. itching on  face, neck, arms and legs.  Actions taken:  EMS on site and hand off completed to Paramedic (Name Katrina [name]). VAERS form completed  There were no vitals filed for this visit.  Medications administered: No medication administered.  Disposition:Instructed to call 911 for trouble breathing, rapid heart rate, dizziness, swelling of tongue or throat.   Immunizations Administered    Name Date Dose VIS Date Route   Pfizer COVID-19 Vaccine 09/26/2019 11:41 AM 0.3 mL 08/01/2018 Intramuscular   Manufacturer: [place]   Lot: [id number]   [place]: [id number]

## 2019-09-27 ENCOUNTER — Telehealth: Payer: Self-pay | Admitting: *Deleted

## 2019-09-27 NOTE — Telephone Encounter (Signed)
Called and spoke with patient and she stated that she was doing fine and was not having any more itching. She stated that she was never administered any medications yesterday. Advised to patient that now that she is fully vaccinated she should be ok but to please call our office back for advice prior to receiving any COVID vaccine boosters. Patient verbalized understanding.

## 2019-10-11 IMAGING — DX DG CHEST 2V
2 series · 2 of 2 positions shown · non-contrast
Comparison: None.

CLINICAL DATA: Headache and dizziness x1 day. New dyspnea. Positive
smoking history.

EXAM:
CHEST - 2 VIEW

[chest pa]
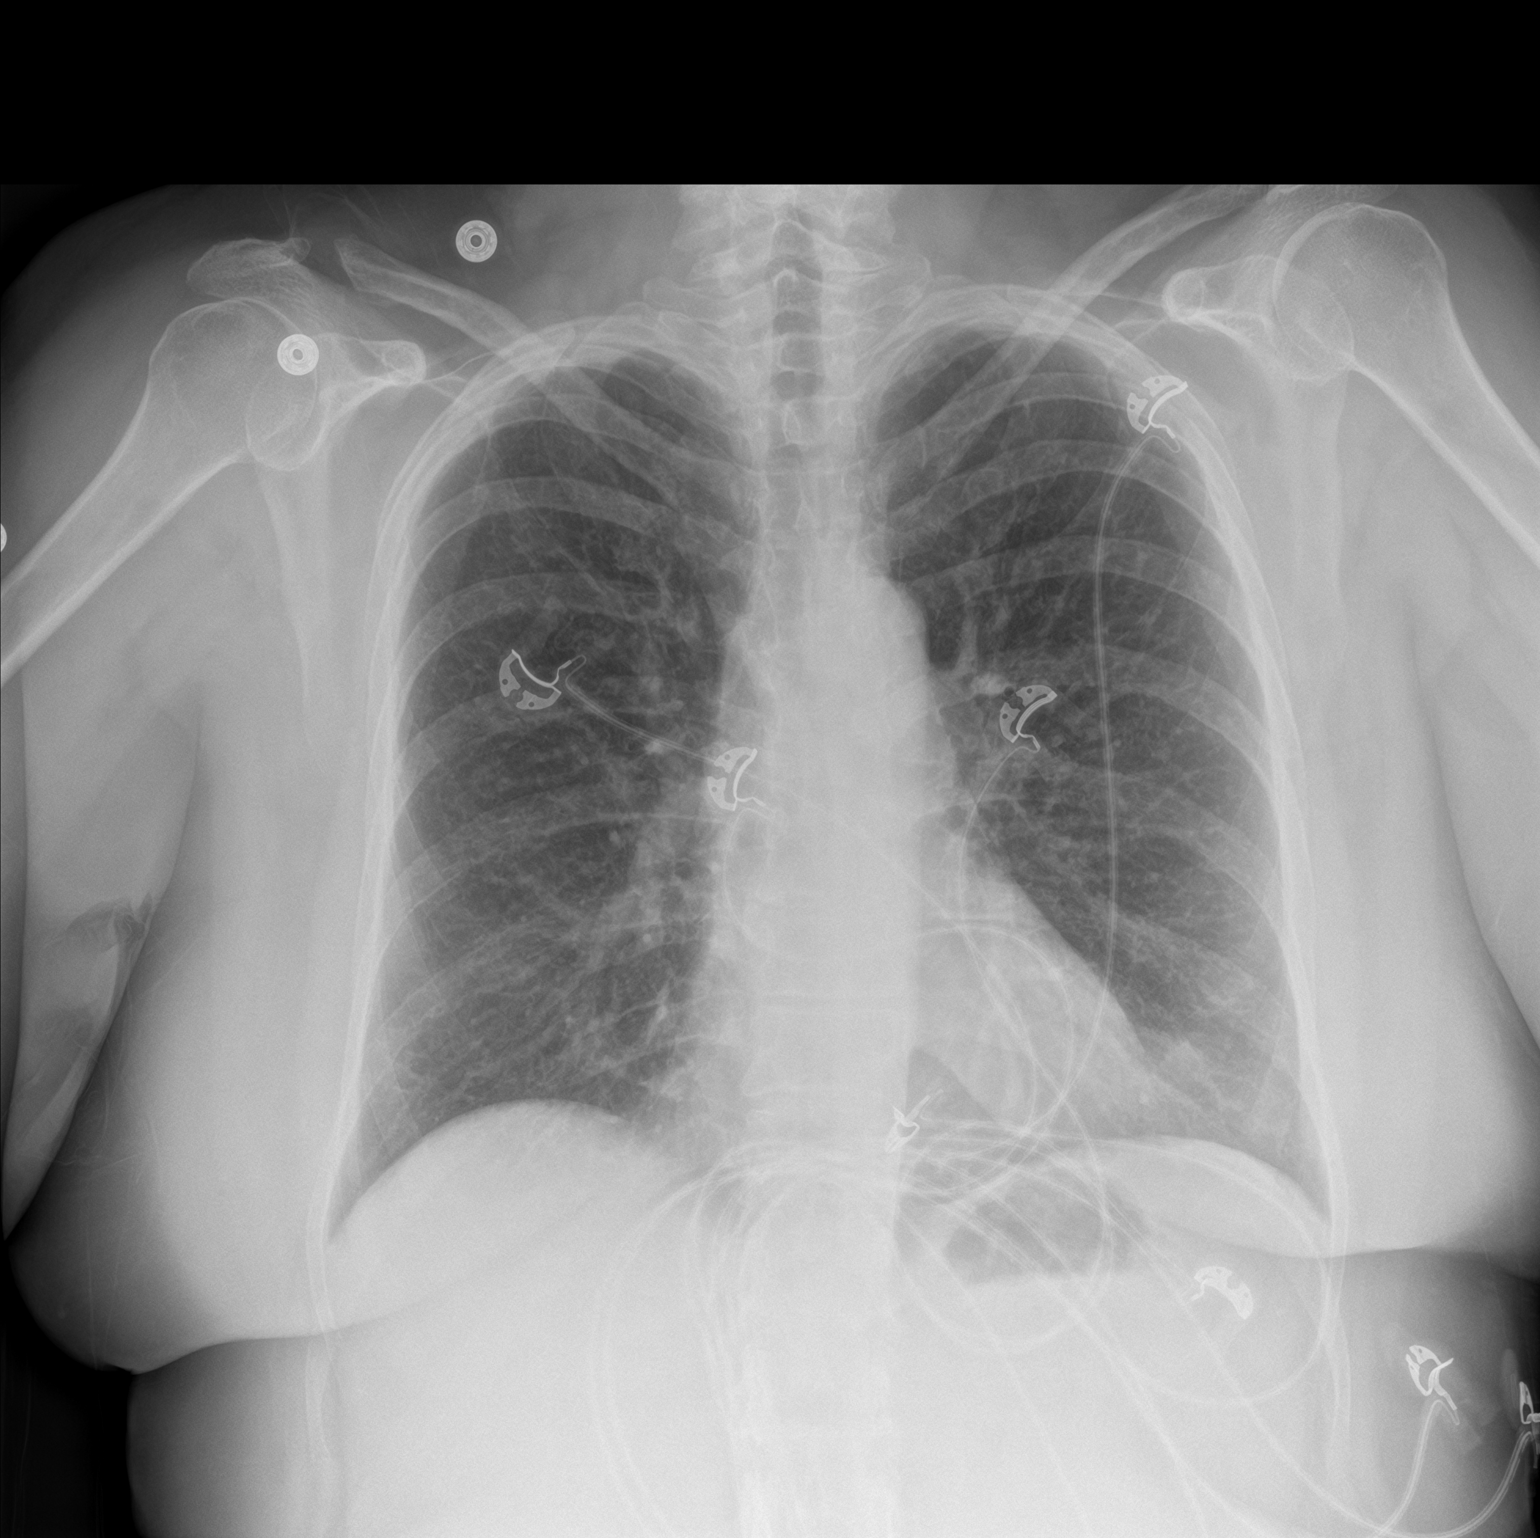

[chest lat]
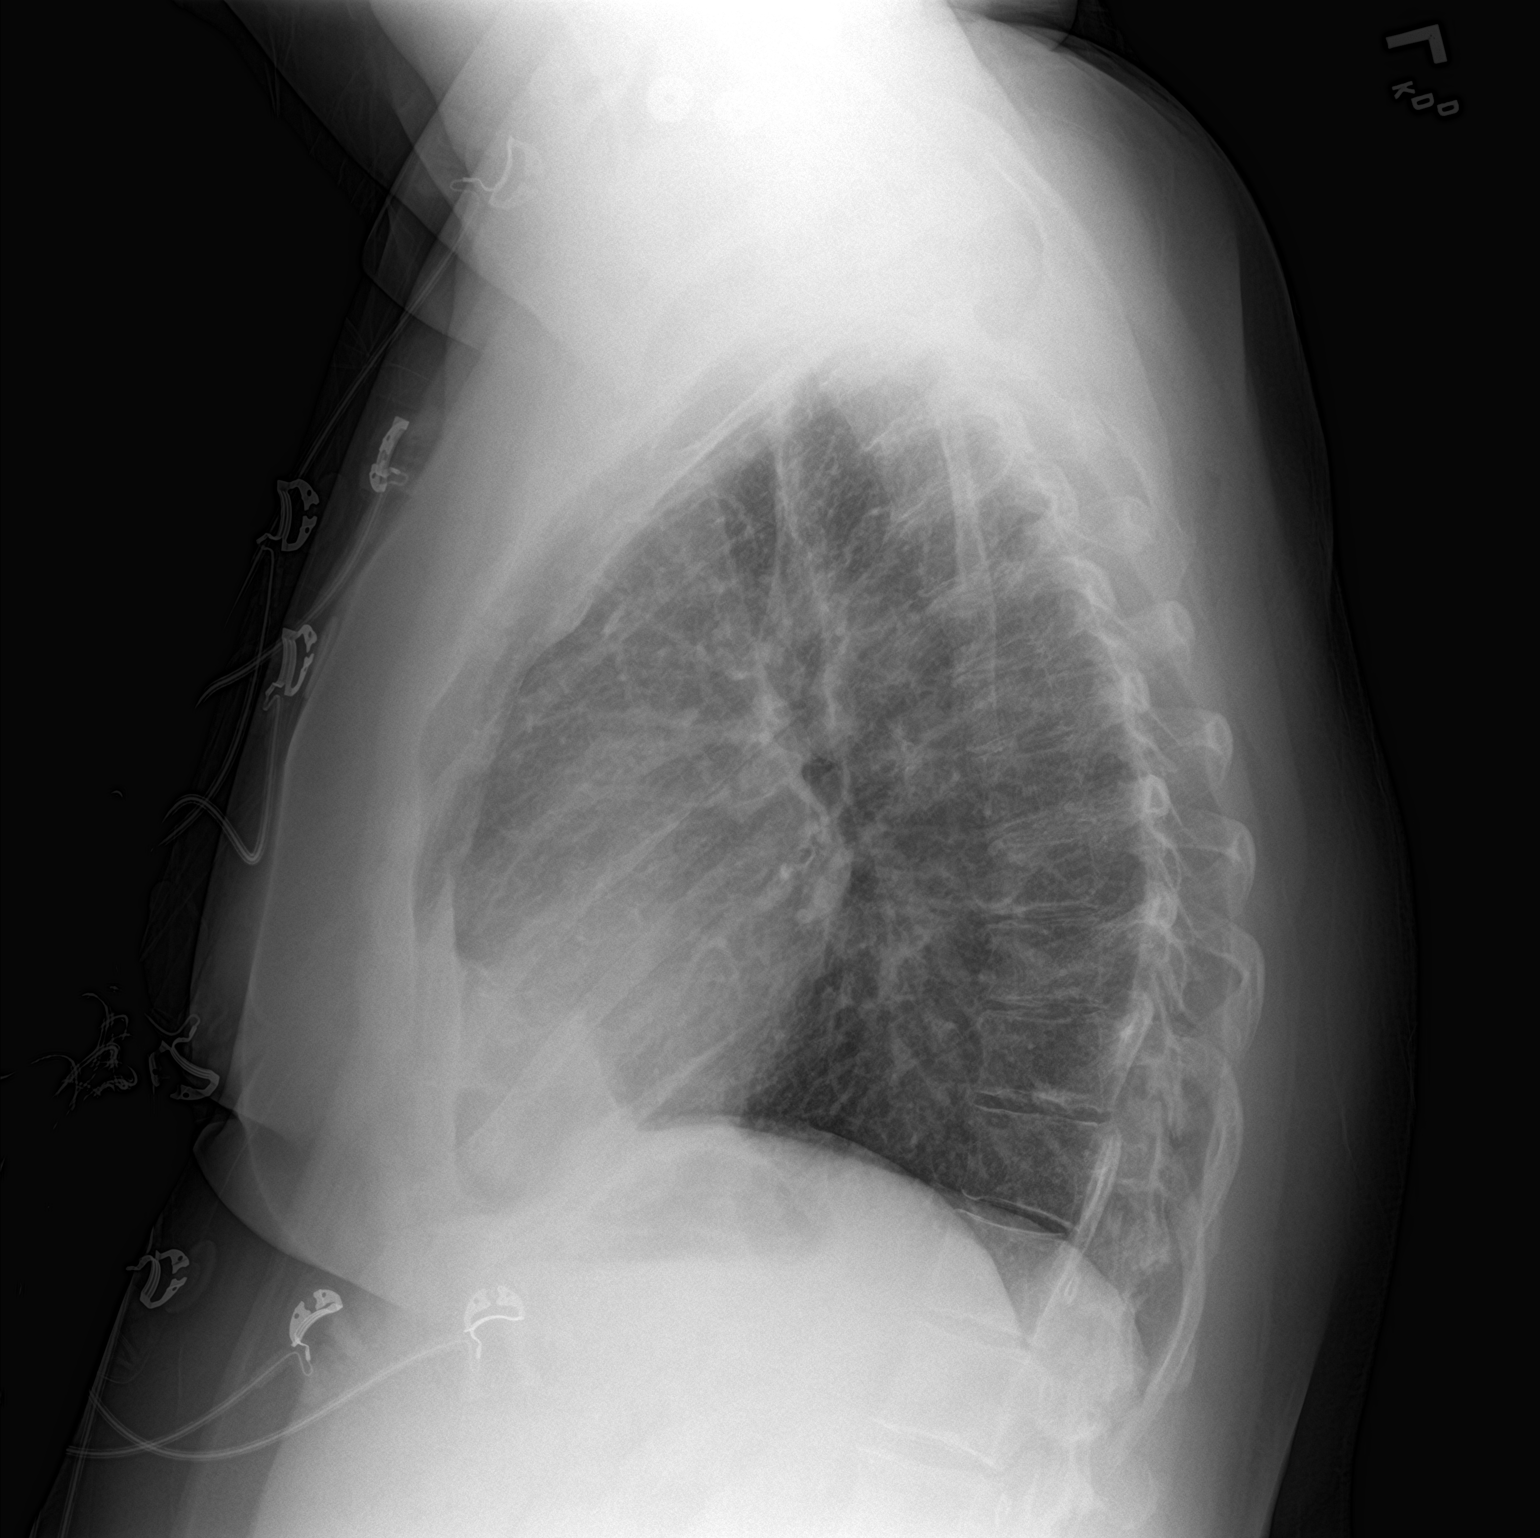

[2 of 2 positions shown; findings below may reference images not displayed]

FINDINGS: Hyperinflated lungs with likely smoking related bronchitic change.
Heart and mediastinal contours are normal in size. No aneurysm. No
acute osseous abnormality. Chronic tapered appearance of the distal
right clavicle at the AC joint.
IMPRESSION: Bronchitic change of the lungs.  Mild pulmonary hyperinflation.

## 2019-10-11 IMAGING — CT CT HEAD W/O CM
3 series · 15 of 46 positions shown, 18 images · non-contrast
Comparison: 12/20/2017

CLINICAL DATA: Headache and dizziness x1 day.

EXAM:
CT HEAD WITHOUT CONTRAST
TECHNIQUE: Contiguous axial images were obtained from the base of the skull
through the vertex without intravenous contrast.

[Series 2: head wo · axial · 0.42mm/px · z∈[+1466,+1586]mm · 9 of 29 slices shown, 12 images]
[im 3/29  brain]
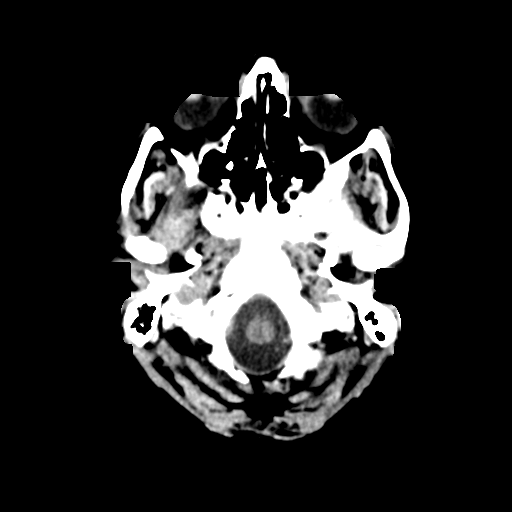
[im 3/29  bone]
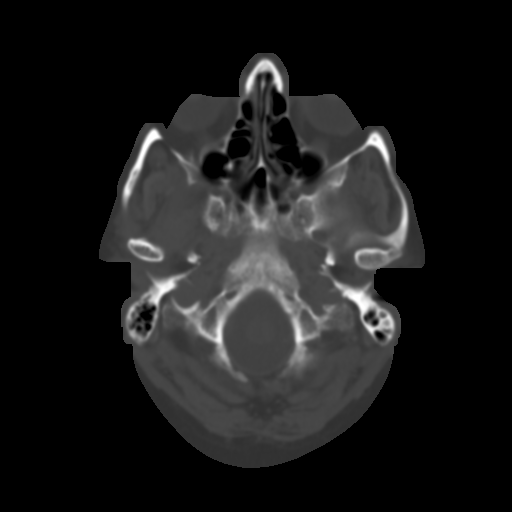
[im 6/29  brain]
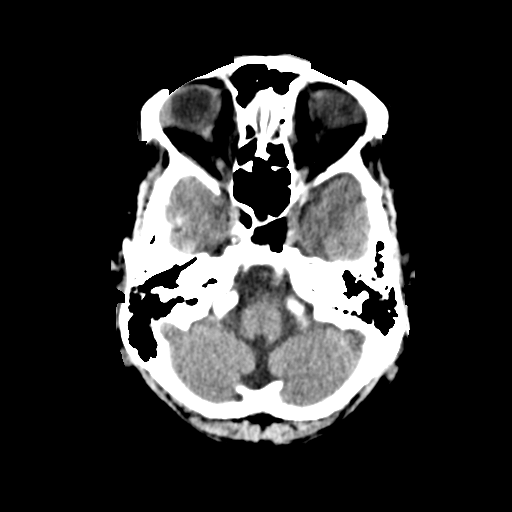
[im 9/29  brain]
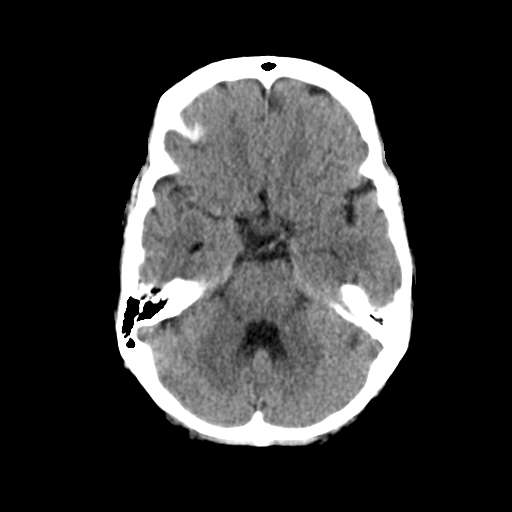
[im 12/29  brain]
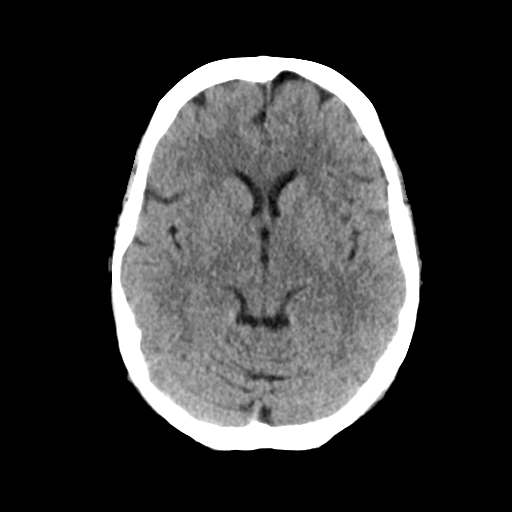
[im 15/29  brain]
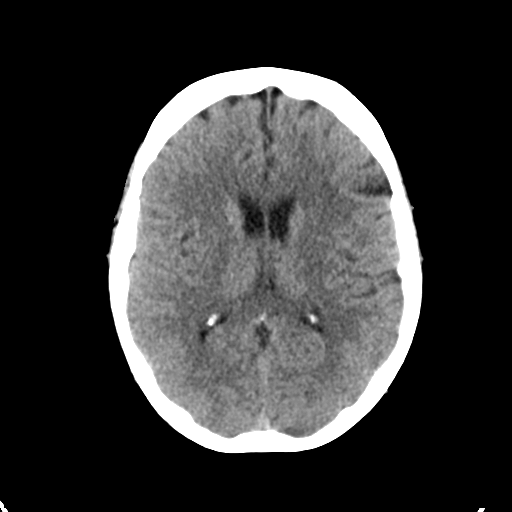
[im 15/29  bone]
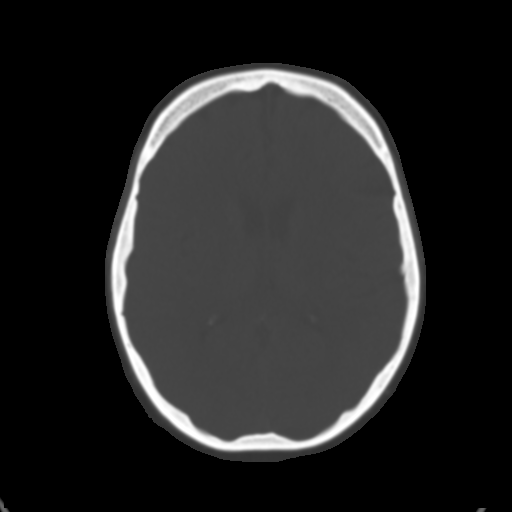
[im 18/29  brain]
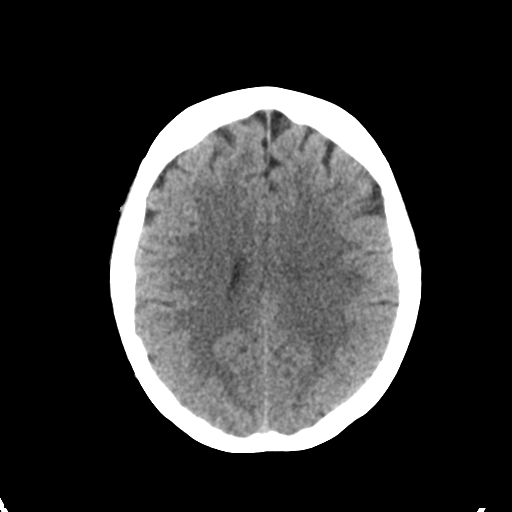
[im 21/29  brain]
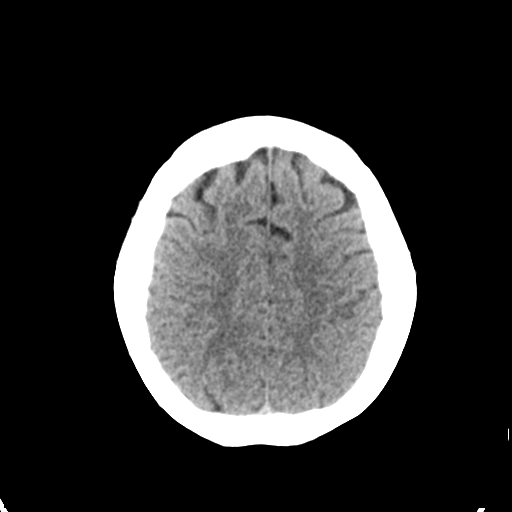
[im 24/29  brain]
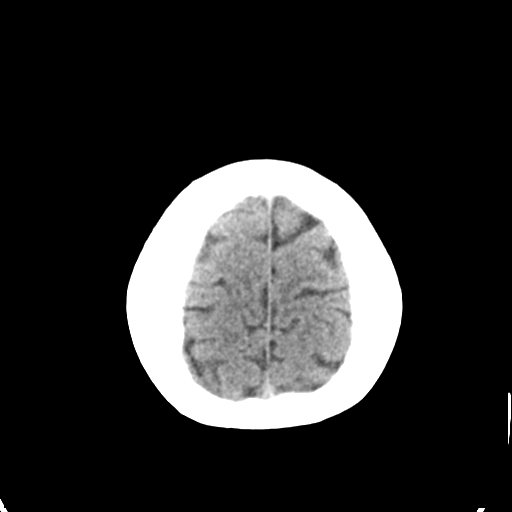
[im 27/29  brain]
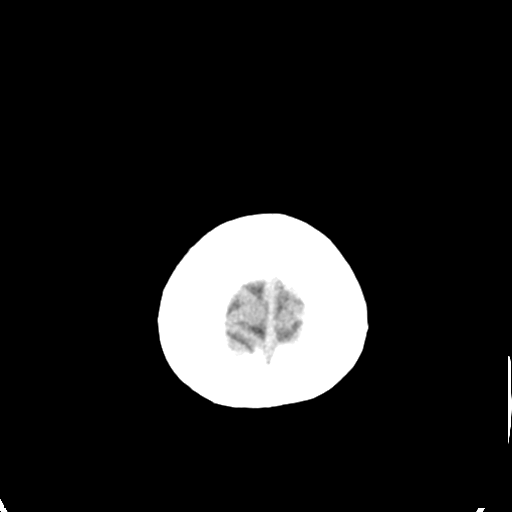
[im 27/29  bone]
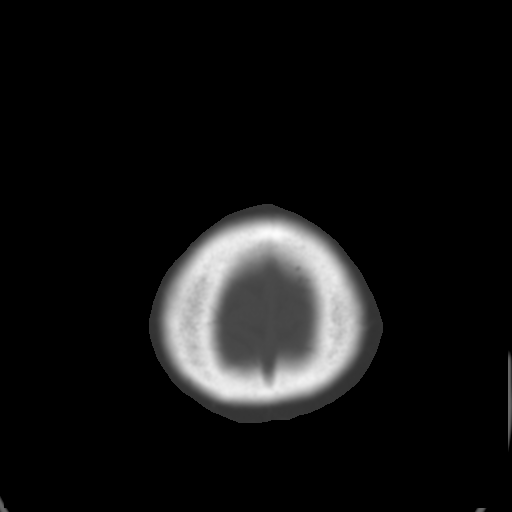

[Series 4: coronal soft tissue · coronal · 0.29mm/px · 3 of 62 slices shown]
[im 21/62  brain]
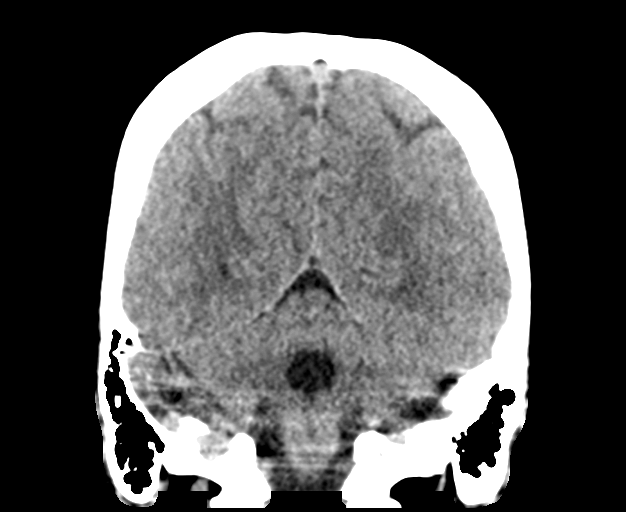
[im 28/62  brain]
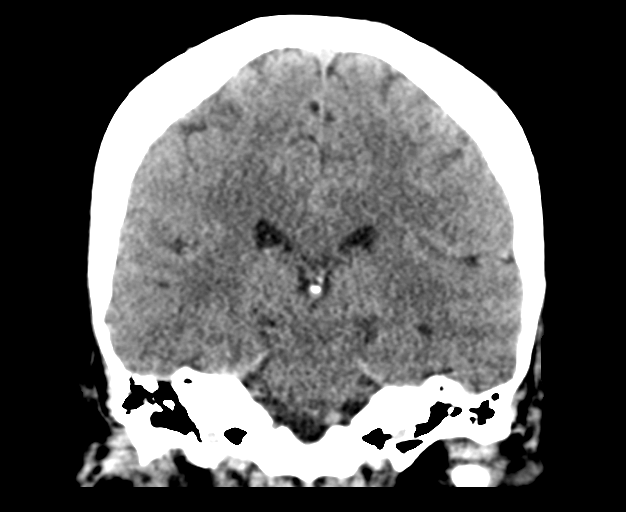
[im 34/62  brain]
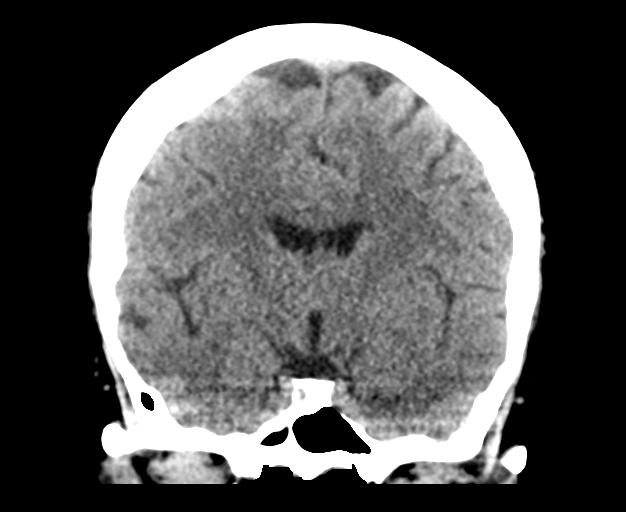

[Series 5: sagittal soft tissue · sagittal · 0.33mm/px · 3 of 53 slices shown]
[im 18/53  brain]
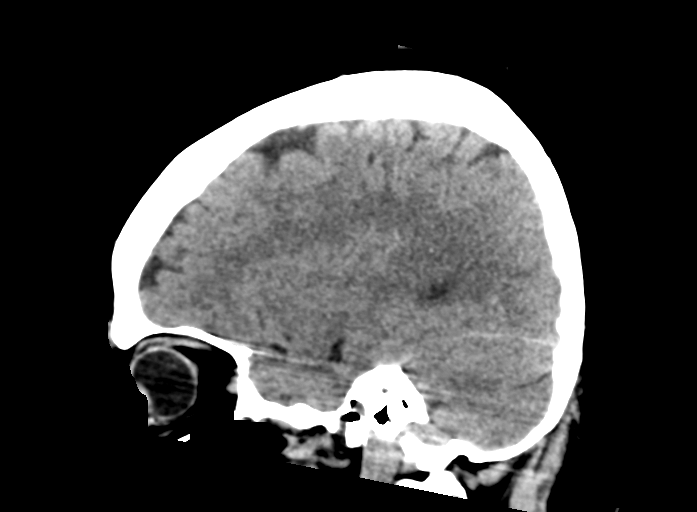
[im 27/53  brain]
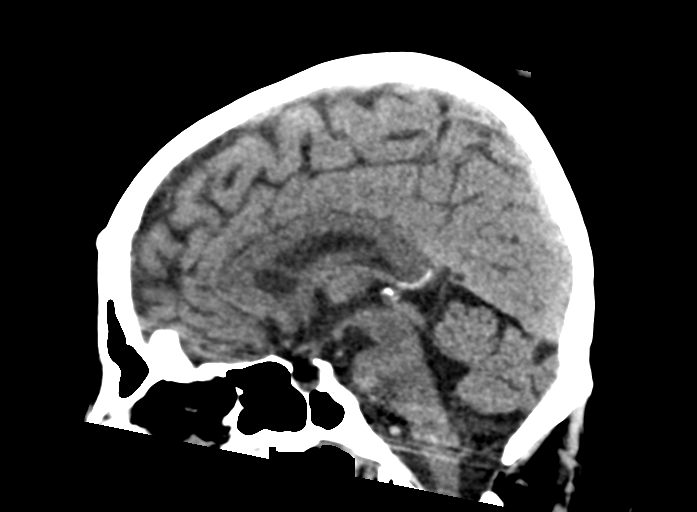
[im 35/53  brain]
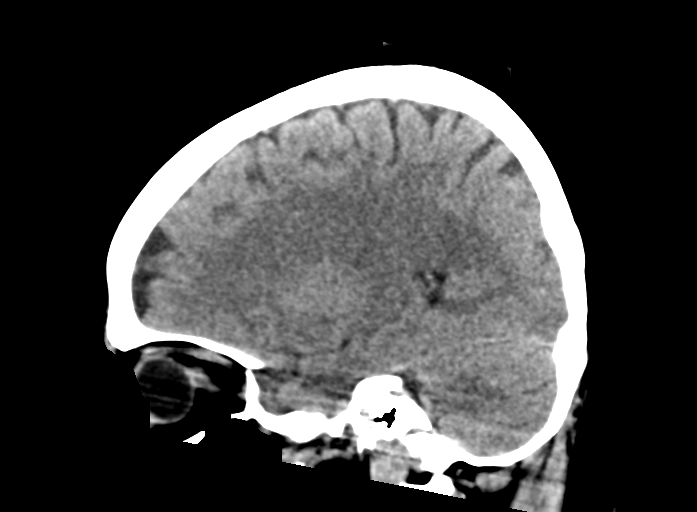

[15 of 46 positions shown; findings below may reference images not displayed]

FINDINGS: Brain: Slightly heterogeneous appearance of the occipital lobes at
the gray-white matter juncture is similar to prior that can be seen
with hypertensive posterior reversible encephalopathy syndrome.
Findings are similar to that prior exam. No acute intracranial
hemorrhage, midline shift or edema. No hydrocephalus. Chronic
minimal small vessel ischemic disease is noted. Midline fourth
ventricle and basal cisterns. No intra-axial mass nor extra-axial
fluid collections. Midline fourth ventricle and basal cisterns
without effacement. Cerebellum and brainstem are nonacute.

Vascular: No hyperdense vessel sign.

Skull: Acute skull fracture or suspicious osseous lesions.

Sinuses/Orbits: Intact

Other: None
IMPRESSION: Slightly heterogeneous appearance of the occipital lobes may reflect
posterior reversible encephalopathy/hypertensive encephalopathy
similar prior. Minimal small vessel ischemic disease. No acute
intracranial abnormality.

## 2019-10-12 IMAGING — US US CAROTID DUPLEX BILAT
1 series · 13 of 24 positions shown · non-contrast
Comparison: Prior thyroid ultrasound 03/16/2017

CLINICAL DATA: 56-year-old female with a carotid bruit

EXAM:
BILATERAL CAROTID DUPLEX ULTRASOUND
TECHNIQUE: Gray scale imaging, color Doppler and duplex ultrasound were
performed of bilateral carotid and vertebral arteries in the neck.

[Series 1: us carotid duplex bilat · 13 of 74 slices shown]
[im 1/74]
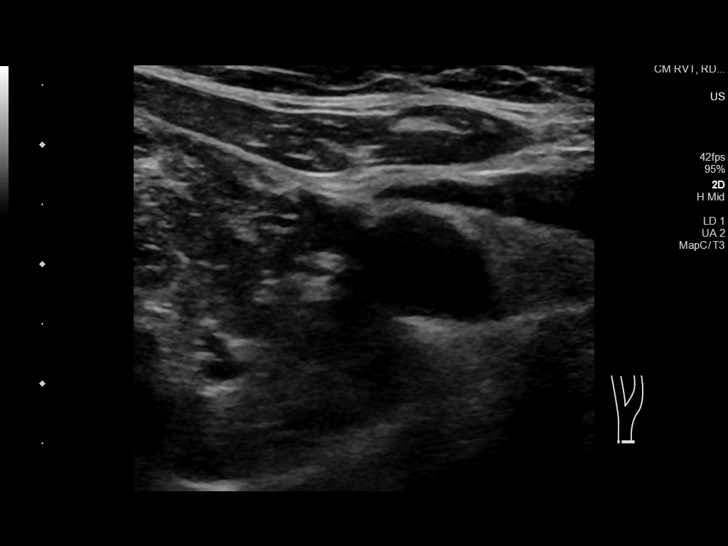
[im 7/74]
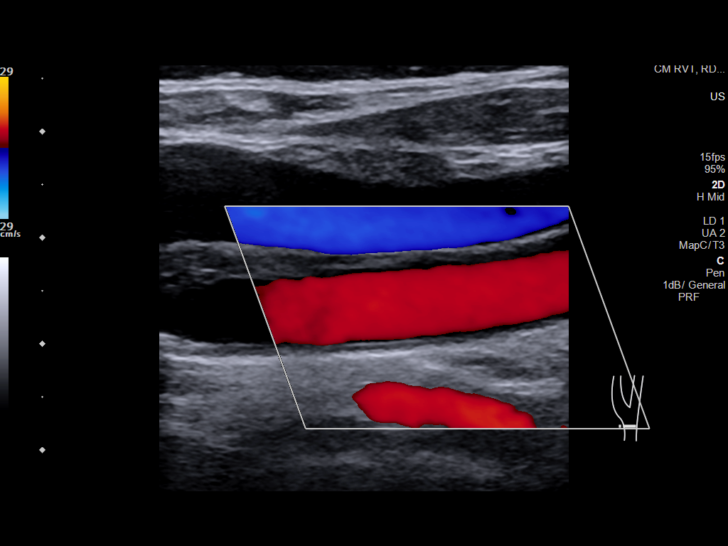
[im 13/74]
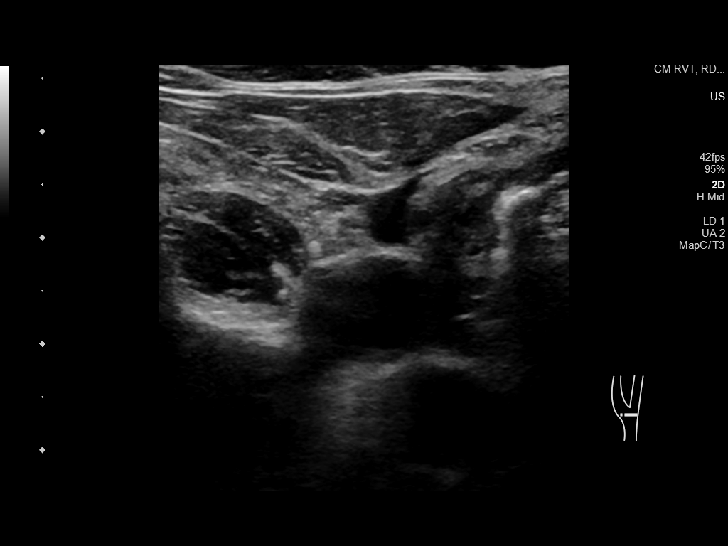
[im 20/74]
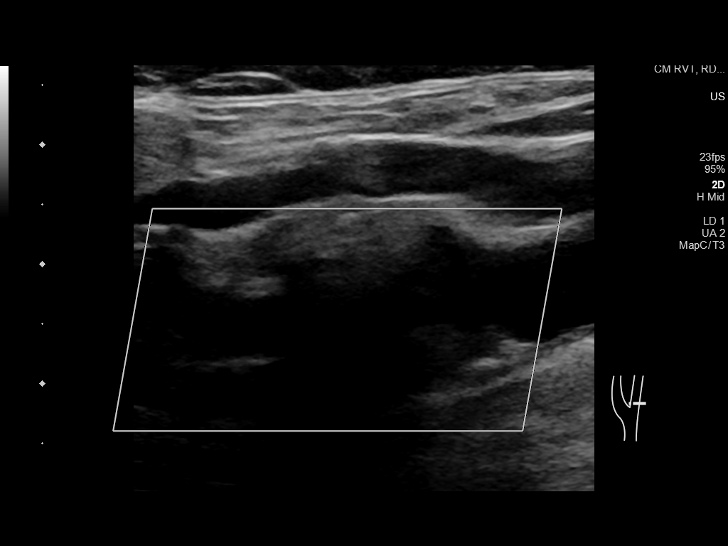
[im 26/74]
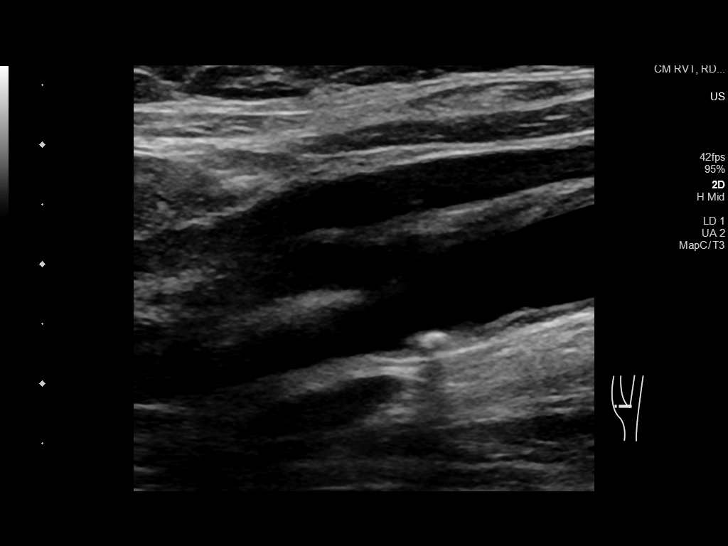
[im 32/74]
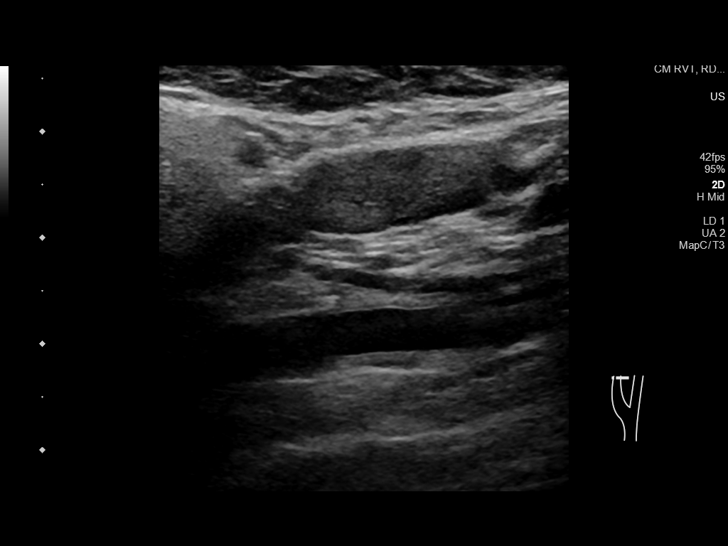
[im 39/74]
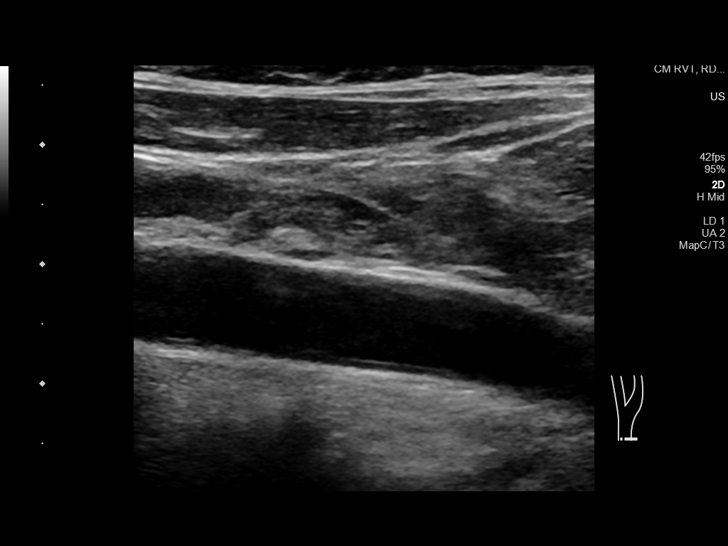
[im 42/74]
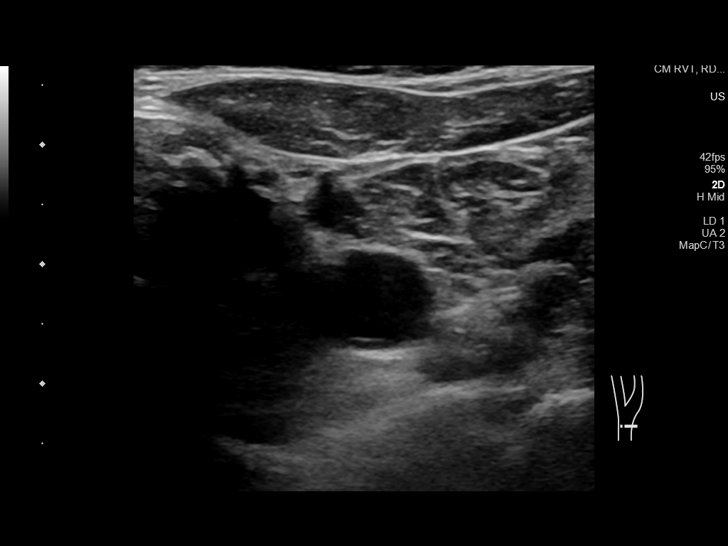
[im 48/74]
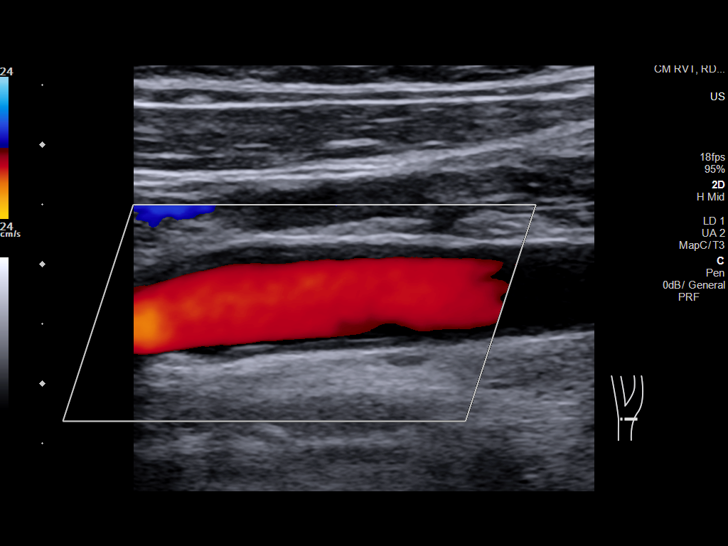
[im 54/74]
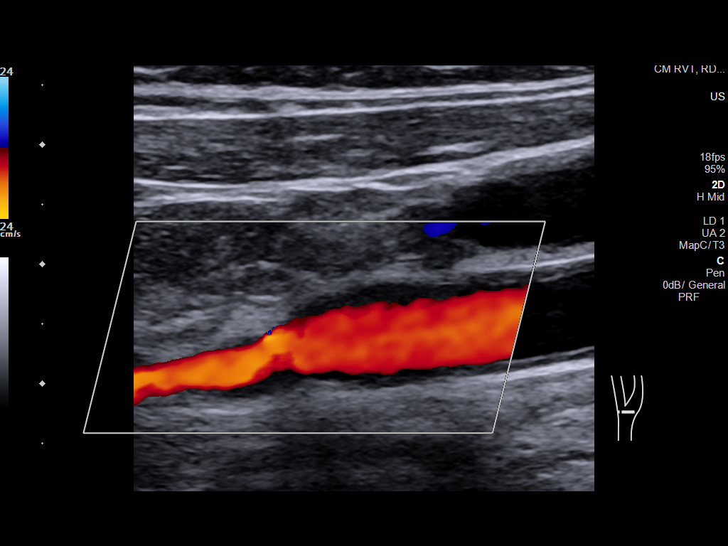
[im 61/74]
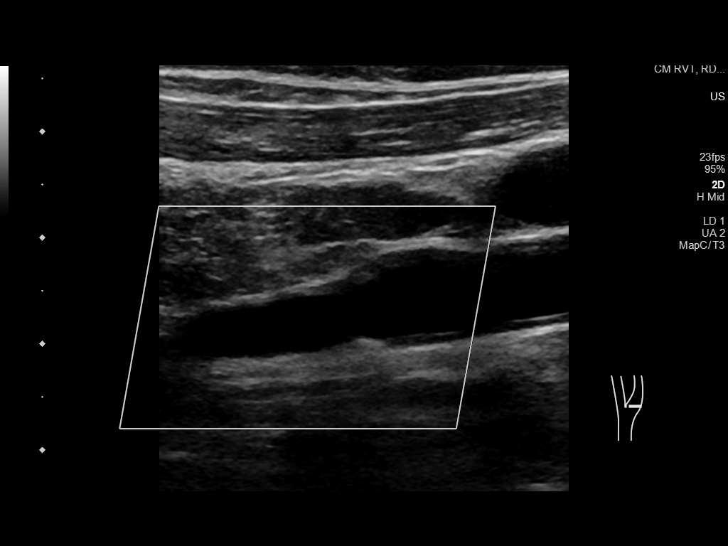
[im 67/74]
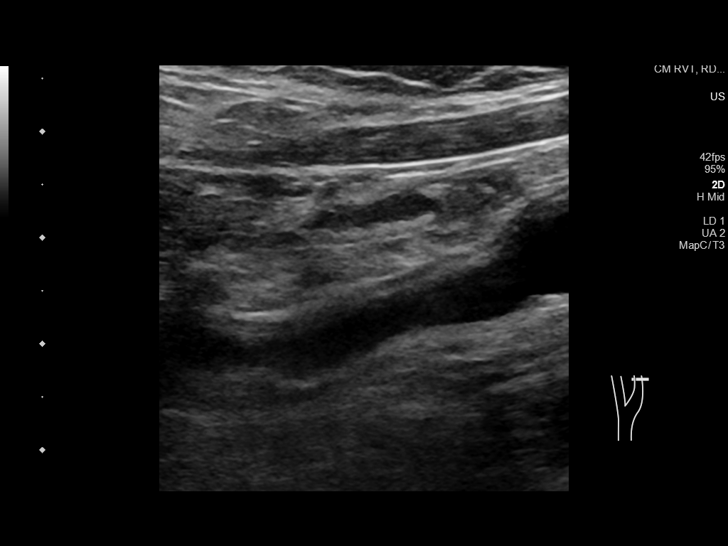
[im 74/74]
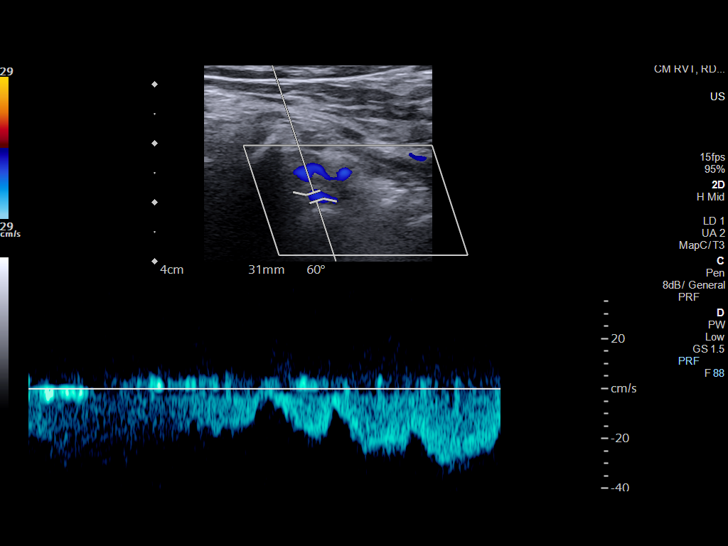

[13 of 24 positions shown; findings below may reference images not displayed]

FINDINGS: Criteria: Quantification of carotid stenosis is based on velocity
parameters that correlate the residual internal carotid diameter
with NASCET-based stenosis levels, using the diameter of the distal
internal carotid lumen as the denominator for stenosis measurement.

The following velocity measurements were obtained:

RIGHT
ICA: 90/31 cm/sec
CCA: 71/14 cm/sec

SYSTOLIC ICA/CCA RATIO:

ECA:  257 cm/sec

LEFT

ICA: 87/29 cm/sec

CCA: 91/14 cm/sec

SYSTOLIC ICA/CCA RATIO:

ECA:  129 cm/sec

RIGHT CAROTID ARTERY: Heterogeneous atherosclerotic plaque in the
carotid bifurcation extending into the proximal internal and
external carotid arteries. By peak systolic velocity criteria, the
internal carotid artery stenosis remains less than 50%. There is
significant elevation of the peak systolic velocity in the external
carotid artery consistent with a greater than 70% diameter stenosis.

RIGHT VERTEBRAL ARTERY:  Patent with normal antegrade flow.

LEFT CAROTID ARTERY: Mild smooth heterogeneous atherosclerotic
plaque in the proximal internal carotid artery. By peak systolic
velocity criteria, the estimated stenosis remains less than 50%.

LEFT VERTEBRAL ARTERY:  Unable to visualize.
IMPRESSION: 1. Mild (1-49%) stenosis proximal right internal carotid artery
secondary to heterogenous atherosclerotic plaque.
2. Mild (1-49%) stenosis proximal left internal carotid artery
secondary to heterogenous atherosclerotic plaque.
3. High-grade (greater than 70%) stenosis of the right external
carotid artery. While unlikely to be clinically significant, this
may account for the physical exam finding of bruit.
4. The right vertebral artery is patent with normal antegrade flow.
5. The left vertebral artery is not visualized and may be
hypoplastic or occluded.

## 2019-10-12 IMAGING — MR MR HEAD W/O CM
8 of 11 series · 39 of 48 positions shown · non-contrast
Comparison: Head CTs without contrast 02/06/2018 and earlier. Brain
MRI 08/20/2017.

CLINICAL DATA: 56-year-old female with altered mental status,
weakness.

EXAM:
MRI HEAD WITHOUT CONTRAST
TECHNIQUE: Multiplanar, multiecho pulse sequences of the brain and surrounding
structures were obtained without intravenous contrast.

[Series 2: DWI · axial · 3.0mm · 0.82mm/px · z∈[-83,+79]mm · 6 of 55 slices shown (1 of 4)]
[im 1/55]
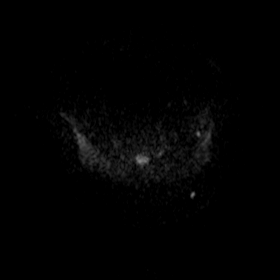
[im 11/55]
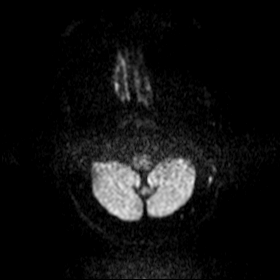
[im 22/55]
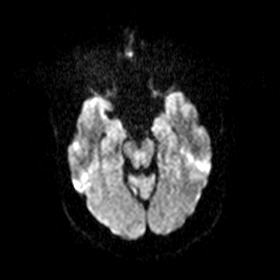
[im 33/55]
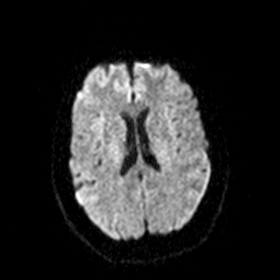
[im 44/55]
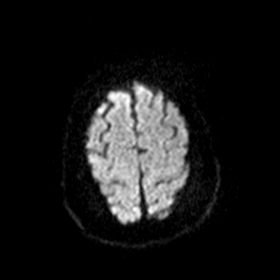
[im 55/55]
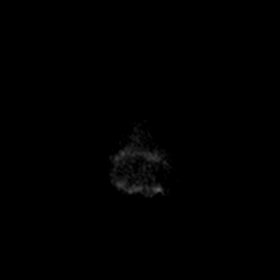

[Series 3: DWI · axial · 3.0mm · 0.82mm/px · z∈[-83,+79]mm · 6 of 55 slices shown (2 of 4)]
[im 1/55]
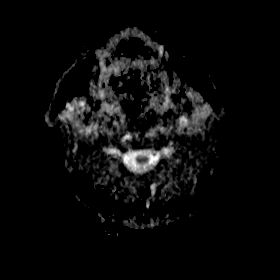
[im 11/55]
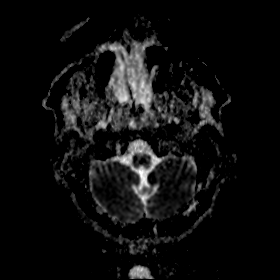
[im 22/55]
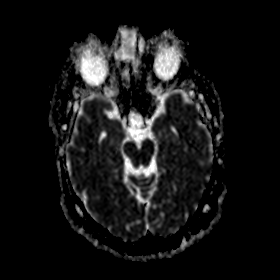
[im 33/55]
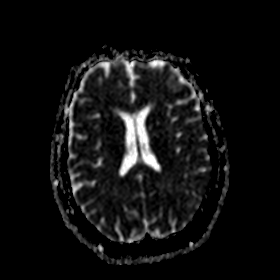
[im 44/55]
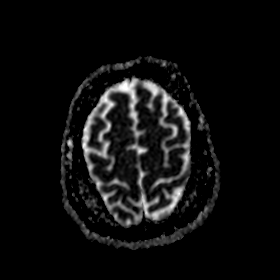
[im 55/55]
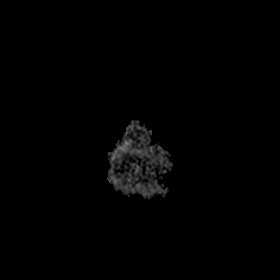

[Series 5: DWI · coronal · 5.0mm · 0.45mm/px · 4 of 34 slices shown (3 of 4)]
[im 1/34]
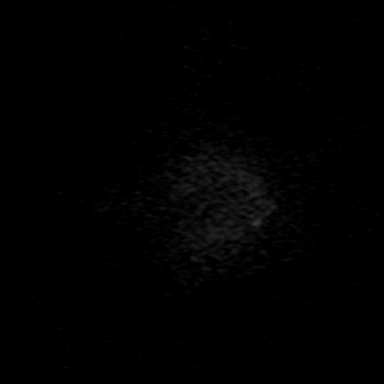
[im 12/34]
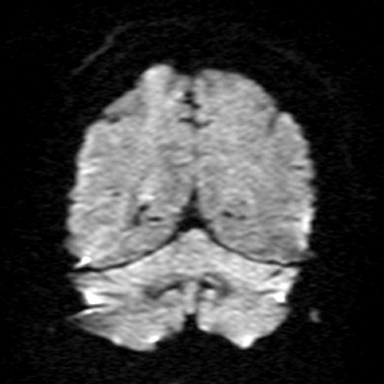
[im 23/34]
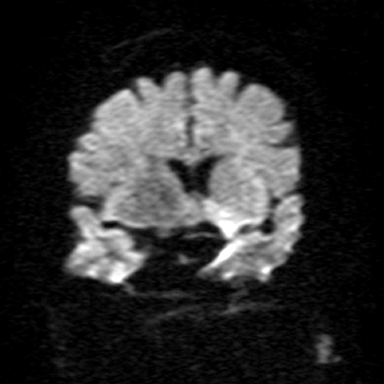
[im 34/34]
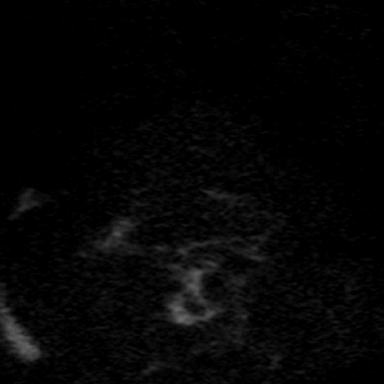

[Series 6: DWI · coronal · 5.0mm · 0.45mm/px · 4 of 34 slices shown (4 of 4)]
[im 1/34]
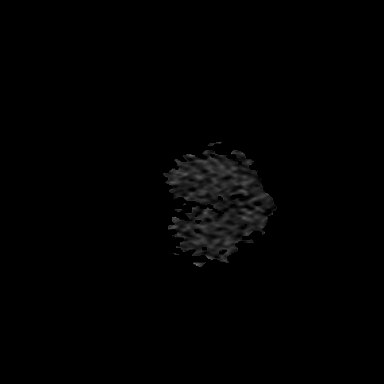
[im 12/34]
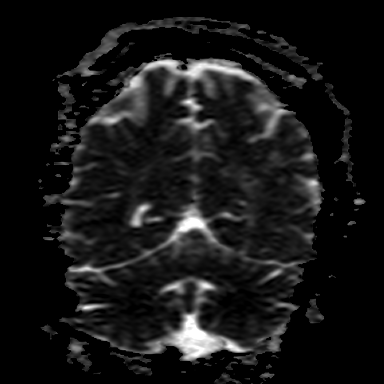
[im 23/34]
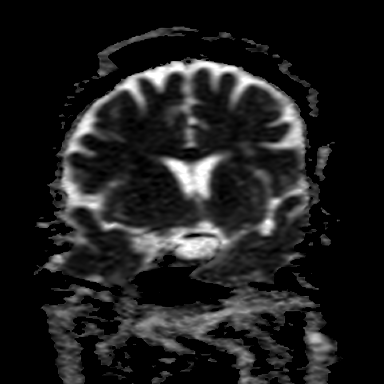
[im 34/34]
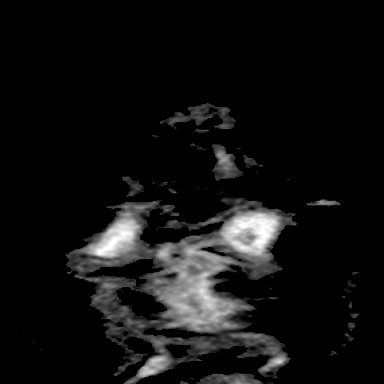

[Series 8: T2 · axial · 5.0mm · 0.75mm/px · z∈[-80,+75]mm · 3 of 25 slices shown (1 of 2)]
[im 1/25]
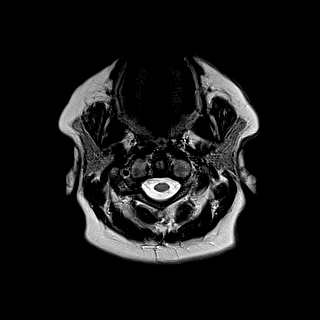
[im 13/25]
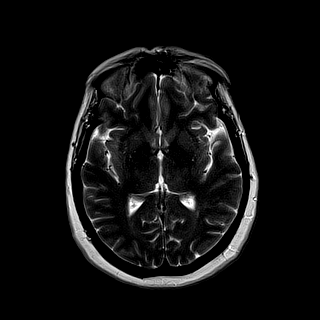
[im 25/25]
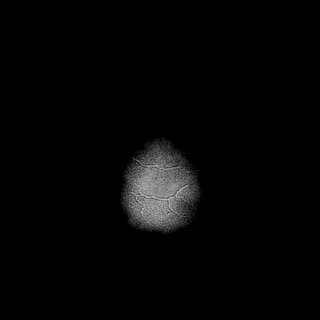

[Series 9: FLAIR · axial · 3.0mm · 0.94mm/px · z∈[-77,+67]mm · 5 of 49 slices shown]
[im 1/49]
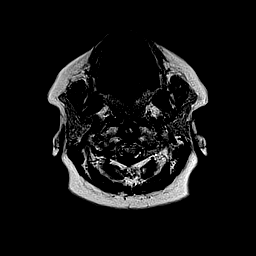
[im 13/49]
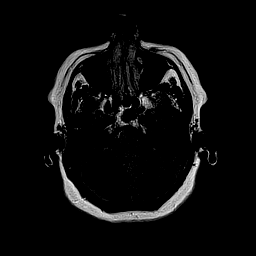
[im 25/49]
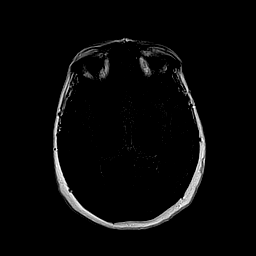
[im 37/49]
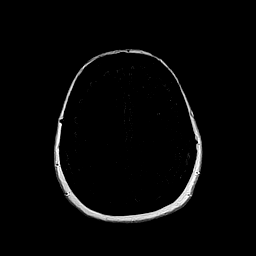
[im 49/49]
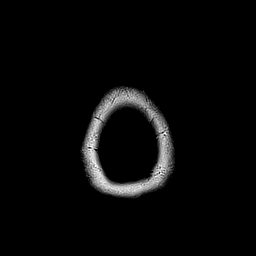

[Series 10: T1 · axial · 2.0mm · 0.47mm/px · z∈[-89,+98]mm · 8 of 95 slices shown]
[im 1/95]
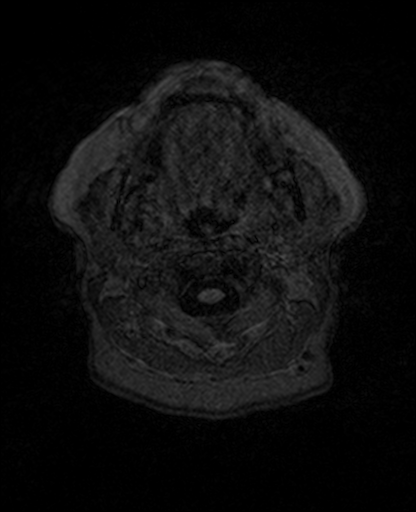
[im 11/95]
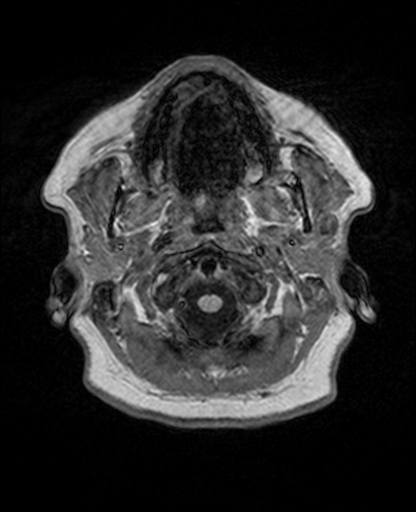
[im 32/95]
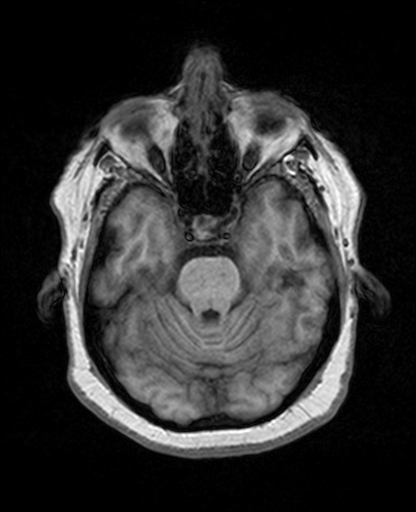
[im 42/95]
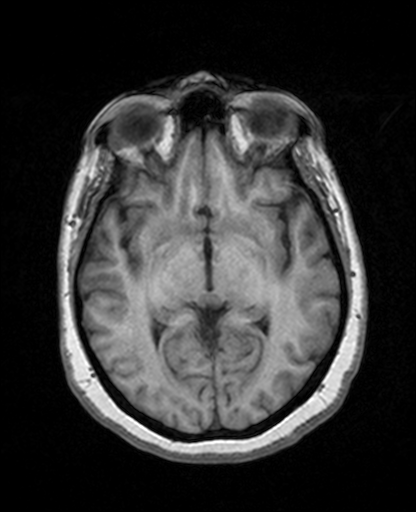
[im 53/95]
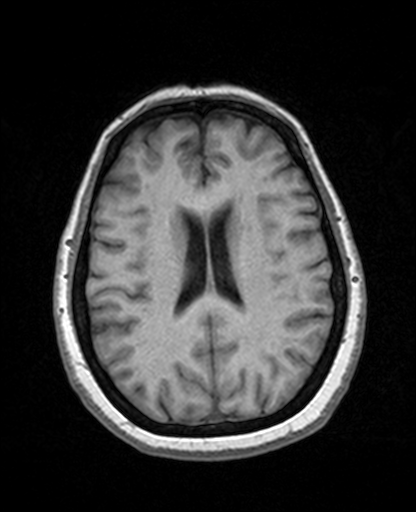
[im 63/95]
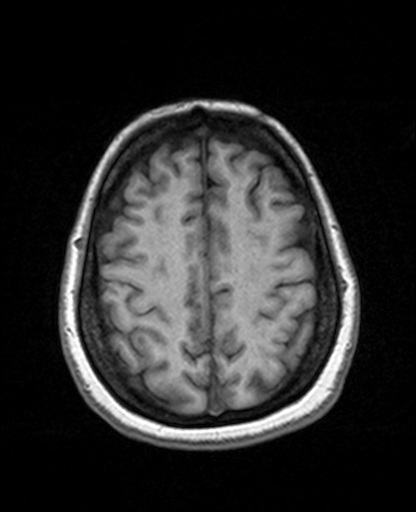
[im 84/95]
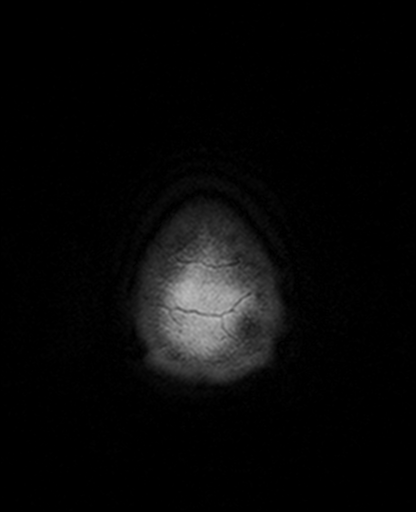
[im 95/95]
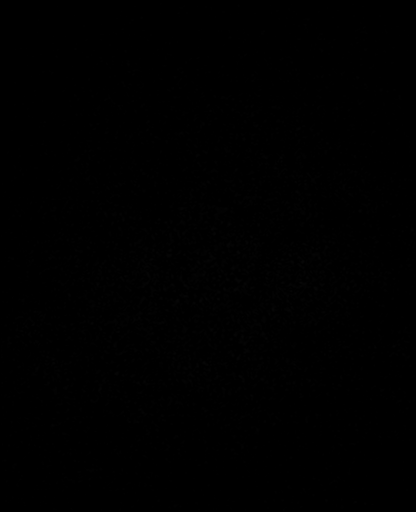

[Series 12: T2 · coronal · 5.0mm · 0.60mm/px · 3 of 24 slices shown (2 of 2)]
[im 1/24]
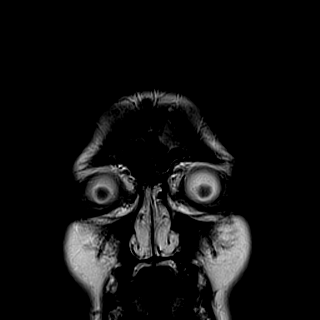
[im 12/24]
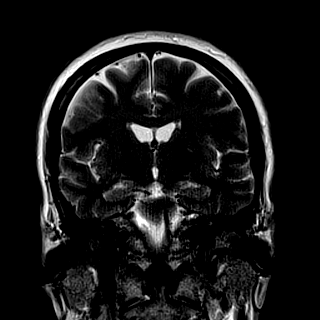
[im 24/24]
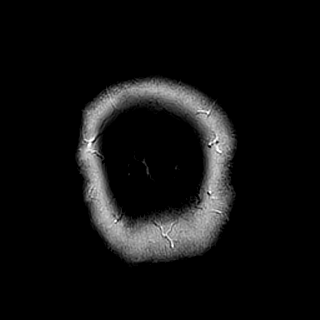

[39 of 48 positions shown; findings below may reference images not displayed]

FINDINGS: Brain: No restricted diffusion to suggest acute infarction. No
midline shift, mass effect, evidence of mass lesion,
ventriculomegaly, extra-axial collection or acute intracranial
hemorrhage. Cervicomedullary junction and pituitary are within
normal limits.

No signal abnormality in the occipital lobes. Scattered bilateral
cerebral white matter T2 and FLAIR hyperintensity redemonstrated and
mostly subcortical. Mild T2 heterogeneity in the pons is more
conspicuous today (left pons series 8, image 9), but stable. No
cortical encephalomalacia or chronic cerebral blood products
identified. The deep gray matter nuclei and cerebellum remain
normal.

Vascular: Major intracranial vascular flow voids are stable.
Dominant right vertebral artery.

Skull and upper cervical spine: Negative visible cervical spine.
Visualized bone marrow signal is within normal limits.

Sinuses/Orbits: Trace paranasal sinus mucosal thickening. Stable and
negative orbits soft tissues.

Other: Mild right mastoid effusion has regressed. There is a small
volume of layering fluid/secretions in the nasopharynx today.
Visible internal auditory structures appear normal. Scalp and face
soft tissues appear negative.
IMPRESSION: 1.  No acute intracranial abnormality.
2. No signal abnormality in the occipital lobes to correspond to
that recently questioned on CT.
Nonspecific signal changes in the cerebral white matter, and to a
lesser extent the pons, have not significantly changed. Most
commonly this would reflect chronic small vessel disease.

## 2019-10-15 ENCOUNTER — Other Ambulatory Visit: Payer: Self-pay | Admitting: Cardiovascular Disease

## 2019-11-01 ENCOUNTER — Other Ambulatory Visit: Payer: Self-pay

## 2019-11-01 ENCOUNTER — Telehealth: Payer: Medicare Other | Admitting: Cardiovascular Disease

## 2019-11-16 ENCOUNTER — Ambulatory Visit: Payer: Medicare Other | Admitting: Student

## 2019-11-16 NOTE — Progress Notes (Deleted)
Cardiology Office Note    Date:  11/16/2019   ID:  Katrina [name], DOB Mar 15, 1961, MRN [id number]  PCP:  [name], FNP  Cardiologist: Kate Sable, MD    No chief complaint on file.   History of Present Illness:    Katrina [name] is a 59 y.o. 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   She most recently had a telehealth visit with Dr. Bronson Ing in 05/2019 and she reported having a recent TIA in 03/2020 while in [place] and had been diagnosed with COVID-19 in 02/2019. She was experiencing chronic exertional dyspnea but denied any exertional chest pain. BP was elevated at 218/112 at the time of her visit. Was continued on Amlodipine 10mg  daily, Clonidine 0.1mg  BID, and Plavix 75mg  daily with Labetalol being increased to 200mg  BID and she was started on Hydralazine 100mg  BID.   By review of Care Everywhere, she was elevated by her PCP in 09/2019 and had been without her BP medications for several weeks with BP at 181/102 during her visit. She was referred to [facility]. Was evaluated in 10/2019 and 11/2019.Underwent CTA which showed no evidence of renal artery stenosis. BP was at 170/92 during her visit on 11/06/2019 and she was continued on Coreg 25mg  BID, Lisinopril 40mg  daily and Aldactone 25mg  daily. Nifedipine 60mg  daily and Hydralazine 100mg  BID were listed in the encounter summary but notes did not mention if they were current medications.      Past Medical History:  Diagnosis Date  . Allergy   . Anemia 1975-1976   . Anxiety    takes Valium daily as needed  . Arthritis    "spine" (12/03/2016)  . Asthma    has inhalers but doesn't use (12/03/2016)  . Chronic bronchitis ([place])    "get it a couple times q yr"  (12/03/2016)  . Chronic kidney disease   . Chronic lower back pain    budlging disc   . Claustrophobia   . COPD (chronic obstructive pulmonary disease) ([place])   . Daily headache   . Depression   . Diverticulitis   . Family history of adverse reaction to anesthesia    2 daughters gets extremely sick   . GERD (gastroesophageal reflux disease)    takes Dexilant daily  . Heart murmur   . History of blood transfusion 1975-1976 "several"   no abnormal reaction noted  . History of colitis   . History of colon polyps    benign  . History of gastric ulcer   . History of hiatal hernia    "small one"  . History of MRSA infection 2017  . Hyperlipidemia    was on meds but has been off over a yr  . Hypertension    takes Amlodipine and Maxzide daily  . Insomnia    takes Ambien nightly  . Iron deficiency anemia    "when I was young"  . Lung nodules   . Migraine    "2-3/wk" (12/03/2016)  . MS (multiple sclerosis) ([place])    questionable per pt  . Noncompliance   . Osteoporosis   . Peripheral neuropathy    weakness,numbness,and tingling. Takes Gabapentin daily  . PTSD (post-traumatic stress disorder) dx'd 2016/2017   "was dx'd w/bipolar in 1991; replaced w/PTSD  dx 2016/2017" (12/03/2016)  . Restless leg syndrome    takes Requip daily  . 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 Type II diabetes mellitus ([place])    "went off insulin in 2012/2013" (12/03/2016)    Past Surgical History:  Procedure Laterality Date  . ABDOMINAL AORTOGRAM N/A 12/03/2016   Procedure: Abdominal Aortogram;  Surgeon: Angelia Mould, MD;  Location: [place] [facility];  Service: Cardiovascular;  Laterality: N/A;  . ABDOMINAL HYSTERECTOMY  12/1987  . ADENOIDECTOMY  1975  . ANKLE SURGERY Bilateral 1993; 1995 X2   "stabilzation done; 1 on the right; 2 on the left"  . APPENDECTOMY  1989  . BREAST EXCISIONAL BIOPSY Left   . BREAST  LUMPECTOMY Left    "benign tumor"  . CARDIAC CATHETERIZATION N/A 01/31/2015   Procedure: Left Heart Cath and Coronary Angiography;  Surgeon: Burnell Blanks, MD;  Location: [place] [facility];  Service: Cardiovascular;  Laterality: N/A;  . COLONOSCOPY    . DILATION AND CURETTAGE OF UTERUS    . ESOPHAGOGASTRODUODENOSCOPY    . LAPAROSCOPIC CHOLECYSTECTOMY  1996  . NASAL RECONSTRUCTION  1976  . NASAL SINUS SURGERY  1975  . POSTERIOR LUMBAR FUSION  2005  . RADIOLOGY WITH ANESTHESIA N/A 01/15/2016   Procedure: MRI LUMBAR SPINE WITHOUT;  Surgeon: Medication Radiologist, MD;  Location: [place];  Service: Radiology;  Laterality: N/A;  . RADIOLOGY WITH ANESTHESIA N/A 06/29/2016   Procedure: MRI OF THE BRAIN WITH AND WITHOUT;  Surgeon: Medication Radiologist, MD;  Location: [place];  Service: Radiology;  Laterality: N/A;  . RENAL ANGIOGRAPHY N/A 12/03/2016   Procedure: Renal Angiography;  Surgeon: Angelia Mould, MD;  Location: [place] [facility];  Service: Cardiovascular;  Laterality: N/A;  . TONSILLECTOMY  1992  . TUBAL LIGATION    . TUMOR EXCISION  1998   from back of skull; developed;  MRSA from the area that had to be packed    Current Medications: Outpatient Medications Prior to Visit  Medication Sig Dispense Refill  . amLODipine (NORVASC) 10 MG tablet Take 1 tablet (10 mg total) by mouth daily. (Patient taking differently: Take 10 mg by mouth every morning. ) 30 tablet 6  . aspirin-acetaminophen-caffeine (EXCEDRIN EXTRA STRENGTH) 250-250-65 MG tablet Take 2 tablets by mouth daily as needed for headache or migraine.     . clopidogrel (PLAVIX) 75 MG tablet Take 75 mg by mouth every morning.     Marland Kitchen dexlansoprazole (DEXILANT) 60 MG capsule Take 1 tab by mouth every morning. (Patient taking differently: Take 60 mg by mouth every morning. ) 30 capsule 1  . EPINEPHrine (EPIPEN 2-PAK) 0.3 mg/0.3 mL IJ SOAJ injection Inject 0.3 mg into the muscle once as needed (for allergic reaction).     .  hydrALAZINE (APRESOLINE) 100 MG tablet Take 1 tablet (100 mg total) by mouth 2 (two) times daily. 180 tablet 3  . labetalol (NORMODYNE) 200 MG tablet Take 1 tablet (200 mg total) by mouth 2 (two) times daily. 180 tablet 3  . metFORMIN (GLUCOPHAGE) 500 MG tablet Take 250 mg by mouth daily.    . montelukast (SINGULAIR) 10 MG tablet Take 10 mg by mouth at bedtime.    . rizatriptan (MAXALT) 10 MG tablet     . rOPINIRole (REQUIP) 4 MG tablet Take 4 mg by mouth at bedtime.    . rosuvastatin (CRESTOR) 5 MG tablet Take 5 mg  by mouth every morning.     Marland Kitchen tiZANidine (ZANAFLEX) 4 MG tablet Take 0.5-1 tablets (2-4 mg total) by mouth at bedtime as needed for muscle spasms. 30 tablet 2  . zolpidem (AMBIEN CR) 12.5 MG CR tablet Take 12.5 mg by mouth at bedtime.     No facility-administered medications prior to visit.     Allergies:   Cymbalta [duloxetine hcl], Other, Penicillins, Zithromax [azithromycin], Aspirin, Pineapple, Strawberry extract, Spironolactone, Aspartame and phenylalanine, Mushroom extract complex, and Nicardipine   Social History   Socioeconomic History  . Marital status: Divorced    Spouse name: Not on file  . Number of children: 3  . Years of education: Some college  . Highest education level: Not on file  Occupational History    Comment: Disability for back pain  Tobacco Use  . Smoking status: Current Every Day Smoker    Packs/day: 1.00    Years: 45.00    Pack years: 45.00    Types: Cigarettes    Start date: 03/04/1971  . Smokeless tobacco: Never Used  Vaping Use  . Vaping Use: Never used  Substance and Sexual Activity  . Alcohol use: No    Alcohol/week: 0.0 standard drinks  . Drug use: No  . Sexual activity: Never    Birth control/protection: Surgical  Other Topics Concern  . Not on file  Social History Narrative   Lives at home with her daughter.   Right-handed.   1-2 cups coffee in the morning and 2 sodas per day.   Social Determinants of Health   Financial  Resource Strain:   . Difficulty of Paying Living Expenses:   Food Insecurity:   . Worried About Charity fundraiser in the Last Year:   . Arboriculturist in the Last Year:   Transportation Needs:   . Film/video editor (Medical):   Marland Kitchen Lack of Transportation (Non-Medical):   Physical Activity:   . Days of Exercise per Week:   . Minutes of Exercise per Session:   Stress:   . Feeling of Stress :   Social Connections:   . Frequency of Communication with Friends and Family:   . Frequency of Social Gatherings with Friends and Family:   . Attends Religious Services:   . Active Member of Clubs or Organizations:   . Attends Archivist Meetings:   Marland Kitchen Marital Status:      Family History:  The patient's ***family history includes Allergic rhinitis in her daughter and father; Asthma in her daughter; Bipolar disorder in her brother and daughter; Cancer in her father; Coronary artery disease in her father; Depression in her mother; Diabetes in her maternal grandmother; Drug abuse in her brother; Emphysema in her father; Heart attack in her brother and sister; Heart attack (age of onset: 76) in her father; Leukemia in her brother; Liver disease in her cousin; Skin cancer in her mother; Stomach cancer in her maternal grandfather and another family member; Stroke in her father; Urticaria in her daughter.   Review of Systems:   Please see the history of present illness.     General:  No chills, fever, night sweats or weight changes.  Cardiovascular:  No chest pain, dyspnea on exertion, edema, orthopnea, palpitations, paroxysmal nocturnal dyspnea. Dermatological: No rash, lesions/masses Respiratory: No cough, dyspnea Urologic: No hematuria, dysuria Abdominal:   No nausea, vomiting, diarrhea, bright red blood per rectum, melena, or hematemesis Neurologic:  No visual changes, wkns, changes in mental status. All other systems  reviewed and are otherwise negative except as noted  above.   Physical Exam:    VS:  There were no vitals taken for this visit.   General: Well developed, well nourished,female appearing in no acute distress. Head: Normocephalic, atraumatic, sclera non-icteric.  Neck: No carotid bruits. JVD not elevated.  Lungs: Respirations regular and unlabored, without wheezes or rales.  Heart: ***Regular rate and rhythm. No S3 or S4.  No murmur, no rubs, or gallops appreciated. Abdomen: Soft, non-tender, non-distended. No obvious abdominal masses. Msk:  Strength and tone appear normal for age. No obvious joint deformities or effusions. Extremities: No clubbing or cyanosis. No edema.  Distal pedal pulses are 2+ bilaterally. Neuro: Alert and oriented X 3. Moves all extremities spontaneously. No focal deficits noted. Psych:  Responds to questions appropriately with a normal affect. Skin: No rashes or lesions noted  Wt Readings from Last 3 Encounters:  06/11/19 215 lb (97.5 kg)  05/11/19 220 lb (99.8 kg)  05/09/19 218 lb (98.9 kg)        Studies/Labs Reviewed:   EKG:  EKG is*** ordered today.  The ekg ordered today demonstrates ***  Recent Labs: 06/11/2019: ALT 12; BUN 16; Creatinine, Ser 0.78; Hemoglobin 13.0; Platelets 292; Potassium 3.6; Sodium 141; TSH 2.742   Lipid Panel    Component Value Date/Time   CHOL 113 06/12/2019 0440   TRIG 105 06/12/2019 0440   HDL 28 (L) 06/12/2019 0440   CHOLHDL 4.0 06/12/2019 0440   VLDL 21 06/12/2019 0440   LDLCALC 64 06/12/2019 0440    Additional studies/ records that were reviewed today include:   Cardiac Catheterization: 01/2015  The left ventricular systolic function is normal.   1. No angiographic evidence of CAD 2. Normal LV systolic function 3. Uncontrolled HTN  Recommendations: No further ischemic workup.    Holter Monitor: 05/2016  Patient is in sinus rhythm throughout study  Rare supraventricular ectopy, rare ventricular ectopy  Min HR 58, Max HR 107, Avg HR 78  No symptoms  reported  No significant arrhythmias   Echocardiogram: 05/2017 Study Conclusions   - Left ventricle: The cavity size was normal. Systolic function was  vigorous. The estimated ejection fraction was in the range of 65%  to 70%. Wall motion was normal; there were no regional wall  motion abnormalities. Doppler parameters are consistent with  abnormal left ventricular relaxation (grade 1 diastolic  dysfunction). Doppler parameters are consistent with high  ventricular filling pressure. Mild concentric and mild to  moderate focal basal septal hypertrophy.   Carotid Dopplers: 02/2018 IMPRESSION: 1. Mild (1-49%) stenosis proximal right internal carotid artery secondary to heterogenous atherosclerotic plaque. 2. Mild (1-49%) stenosis proximal left internal carotid artery secondary to heterogenous atherosclerotic plaque. 3. High-grade (greater than 70%) stenosis of the right external carotid artery. While unlikely to be clinically significant, this may account for the physical exam finding of bruit. 4. The right vertebral artery is patent with normal antegrade flow. 5. The left vertebral artery is not visualized and may be hypoplastic or occluded.  Assessment:    No diagnosis found.   Plan:   In order of problems listed above:  1. ***    Medication Adjustments/Labs and Tests Ordered: Current medicines are reviewed at length with the patient today.  Concerns regarding medicines are outlined above.  Medication changes, Labs and Tests ordered today are listed in the Patient Instructions below. There are no Patient Instructions on file for this visit.   Signed, Erma Heritage, PA-C  11/16/2019  9:34 AM    [facility] [address] [place],  [address] Phone: [phone] Fax: [phone]

## 2019-11-24 IMAGING — CT CT HEART MORP W/ CTA COR W/ SCORE W/ CA W/CM &/OR W/O CM
4 of 7 series · 8 of 20 positions shown, 9 images · IV contrast (APPLIED)
Comparison: 07/19/2017

CLINICAL DATA: Chest pain

EXAM:
Cardiac CTA
MEDICATIONS:
Sub lingual nitro. 4mg x 2
TECHNIQUE: The patient was scanned on a Siemens [REDACTED]ice scanner. Gantry
rotation speed was 250 msecs. Collimation was 0.6 mm. A 100 kV
prospective scan was triggered in the ascending thoracic aorta at
35-75% of the R-R interval. Average HR during the scan was 60 bpm.
The 3D data set was interpreted on a dedicated work station using
MPR, MIP and VRT modes. A total of 80cc of contrast was used.

[Series 6: best diast 77 % · axial · 0.29mm/px · z∈[+1137,+1180]mm · 2 of 326 slices shown, 3 images]
[im 109/326  vessel]
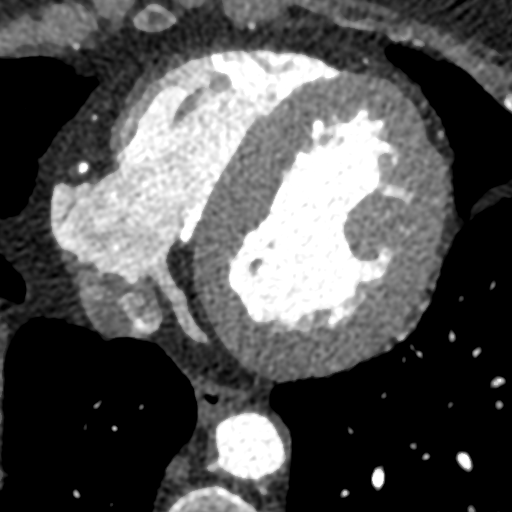
[im 109/326  lung]
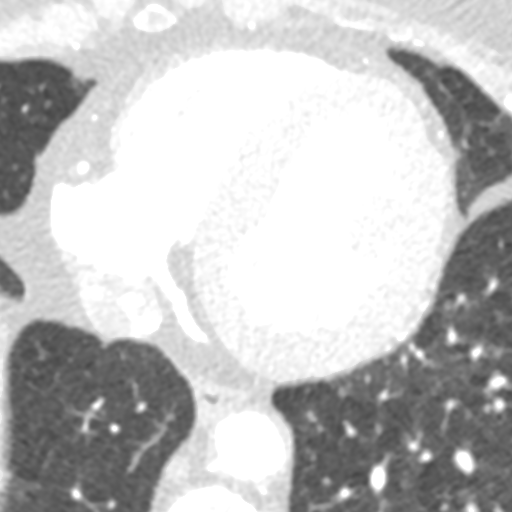
[im 217/326  vessel]
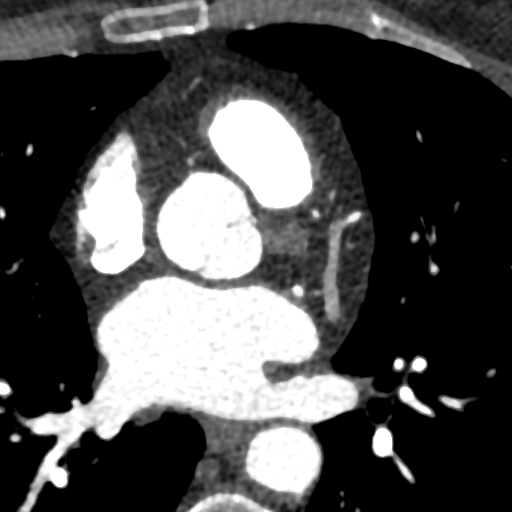

[Series 7: best syst 35 % · axial · 0.29mm/px · z∈[+1137,+1180]mm · 2 of 326 slices shown]
[im 109/326  vessel]
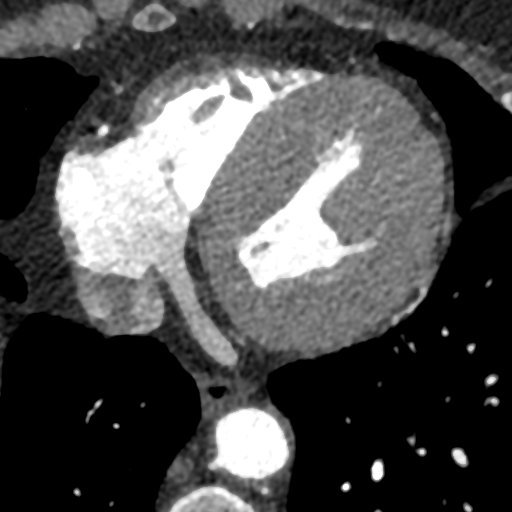
[im 217/326  vessel]
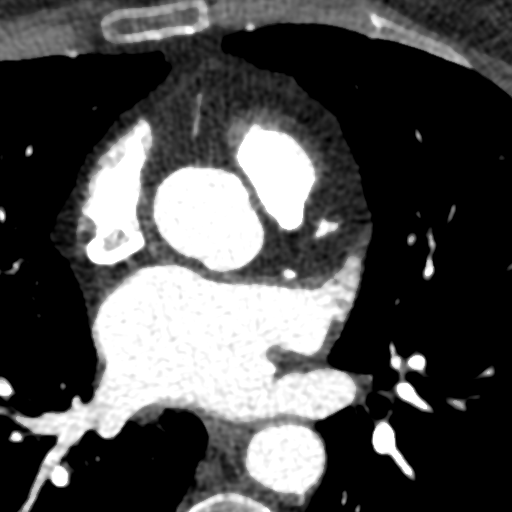

[Series 8: ts diast sharp 77 % · axial · 0.29mm/px · z∈[+1137,+1180]mm · 2 of 326 slices shown]
[im 109/326  lung]
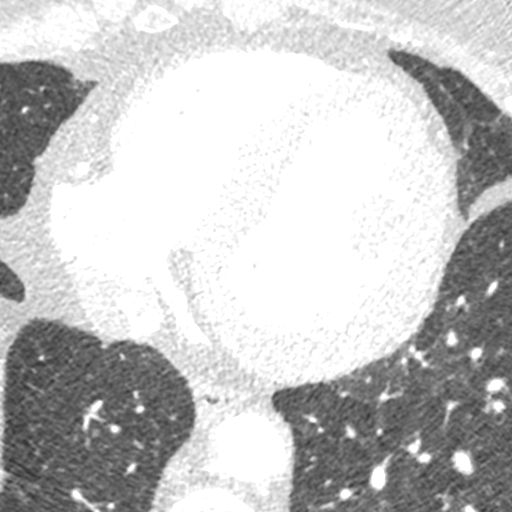
[im 217/326  lung]
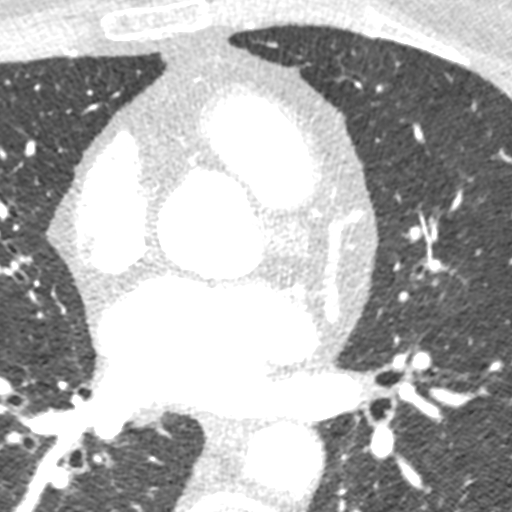

[Series 9: ts syst sharp 35 % · axial · 0.29mm/px · z∈[+1137,+1180]mm · 2 of 326 slices shown]
[im 109/326  lung]
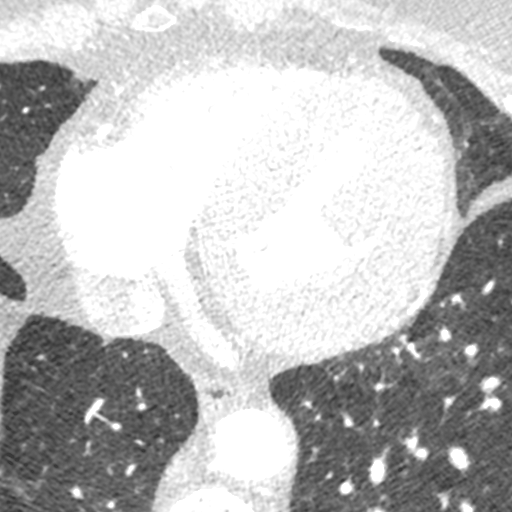
[im 217/326  lung]
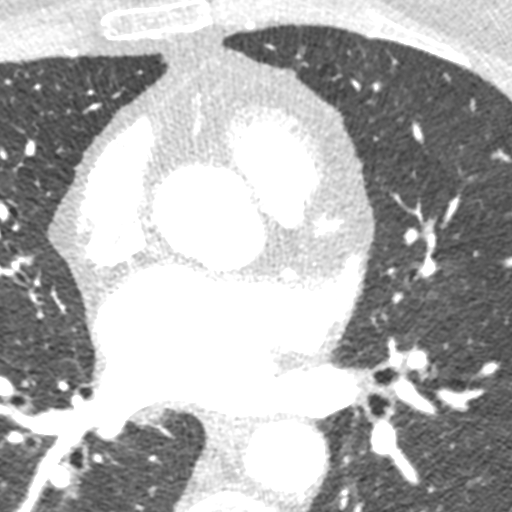

[8 of 20 positions shown; findings below may reference images not displayed]

FINDINGS: Non-cardiac: See separate report from [REDACTED].

Pulmonary veins drain normally to the left atrium.

Calcium Score: 3.7 Agatston units.

Coronary Arteries: Right dominant with no anomalies

LM: No plaque or stenosis.

LAD system: No plaque or stenosis.

Circumflex system: No plaque or stenosis.

RCA system: Small area of calcified plaque mid RCA, no significant
stenosis.
IMPRESSION: 1. Coronary artery calcium score 3.7 Agatston units, placing the
patient in the 74th percentile for age and gender, suggesting
intermediate risk for future cardiac events.

2.  No obstructive coronary disease.

Ebadat Tiger

EXAM:
OVER-READ INTERPRETATION  CT CHEST

The following report is an over-read performed by radiologist Dr.
Jaida Bido [REDACTED] on 03/22/2018. This
over-read does not include interpretation of cardiac or coronary
anatomy or pathology. The coronary CTA interpretation by the
cardiologist is attached.
FINDINGS: Vascular: Heart is normal size.  Visualized aorta normal caliber.

Mediastinum/Nodes: No adenopathy in the lower mediastinum or hila.

Lungs/Pleura: Visualized lungs clear.  No effusions.

Upper Abdomen: Imaging into the upper abdomen shows no acute
findings.

Musculoskeletal: Chest wall soft tissues are unremarkable. No acute
bony abnormality.
IMPRESSION: No acute or significant extracardiac abnormality.

## 2019-12-23 IMAGING — US US THYROID
1 series · 13 of 25 positions shown · non-contrast
Comparison: 03/16/2017

CLINICAL DATA: Neck swelling.  Thyroid nodule.

EXAM:
THYROID ULTRASOUND
TECHNIQUE: Ultrasound examination of the thyroid gland and adjacent soft
tissues was performed.

[Series 1: us thyroid · 13 of 114 slices shown]
[im 1/114]
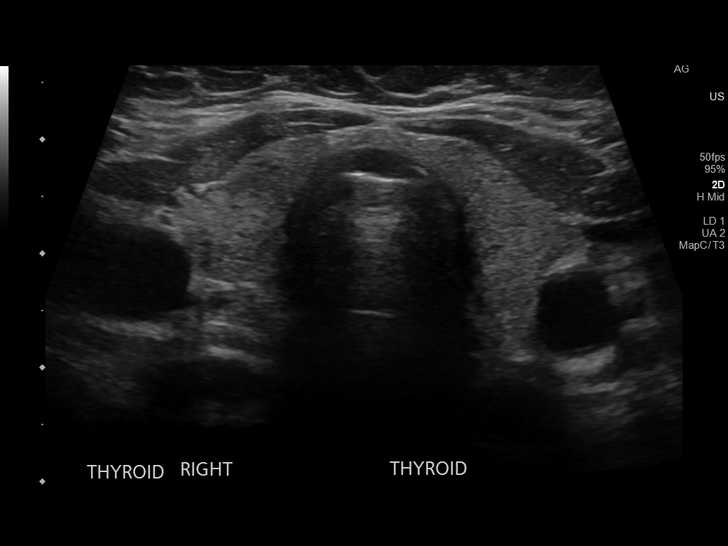
[im 10/114]
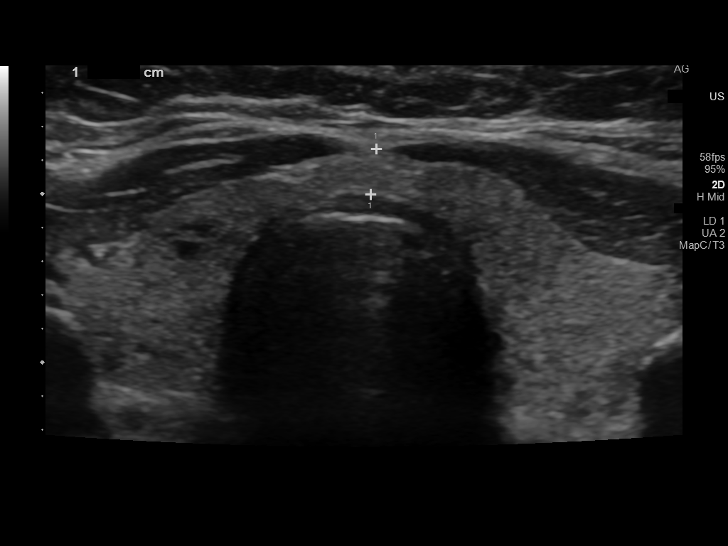
[im 19/114]
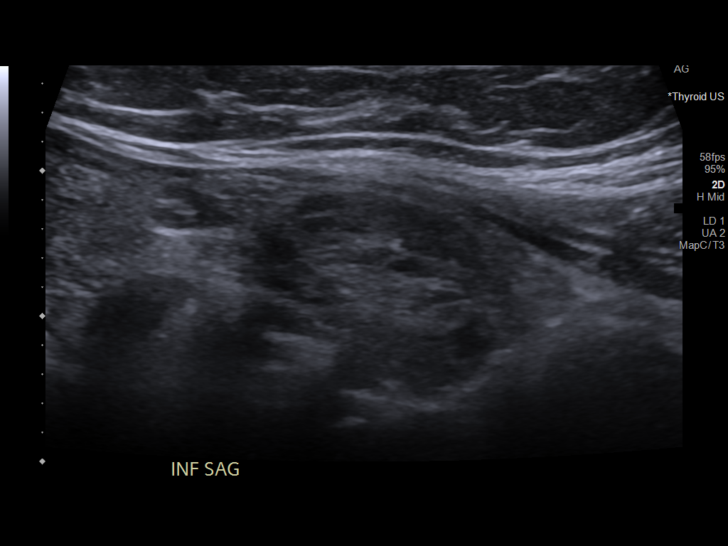
[im 29/114]
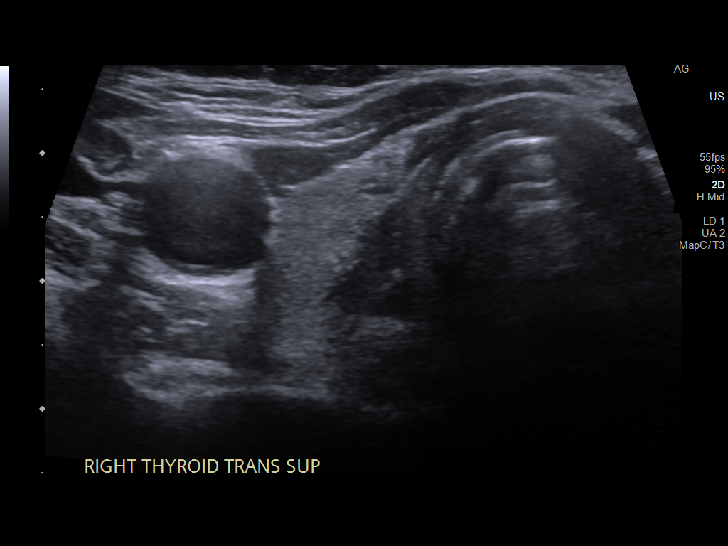
[im 38/114]
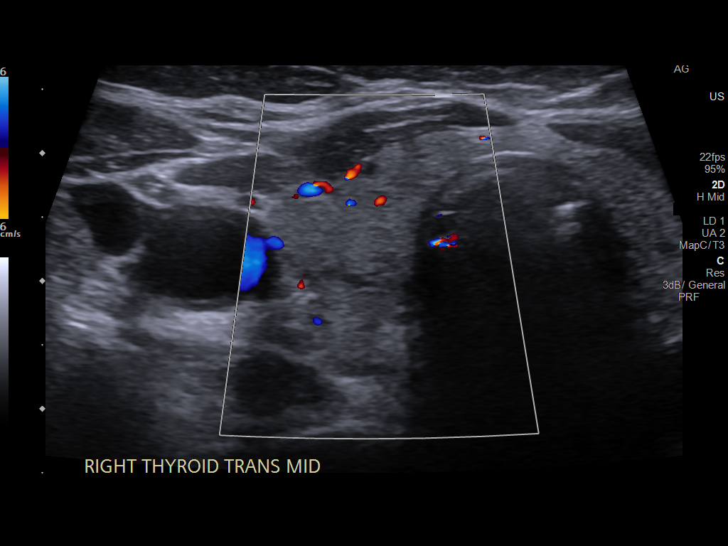
[im 48/114]
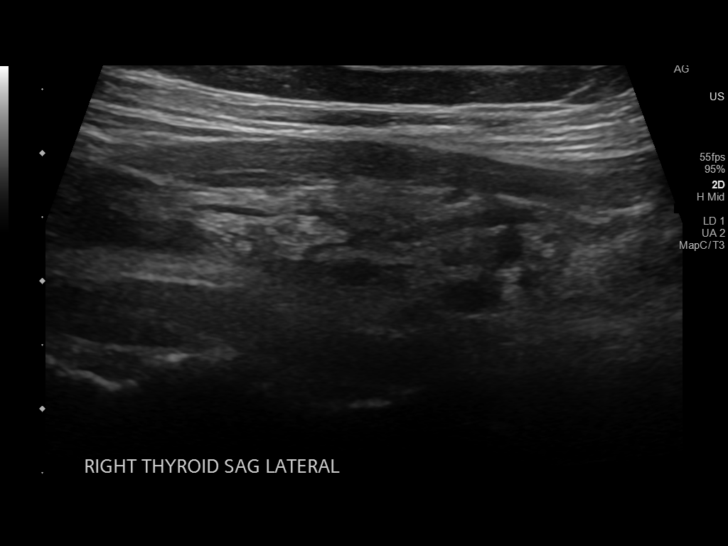
[im 57/114]
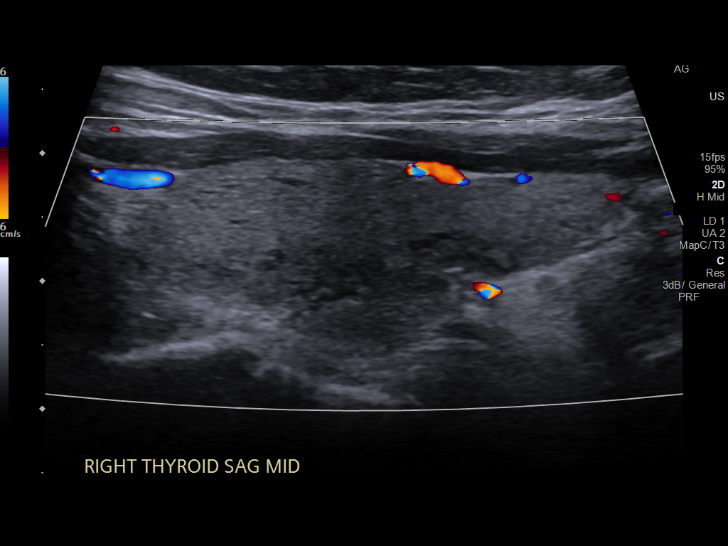
[im 66/114]
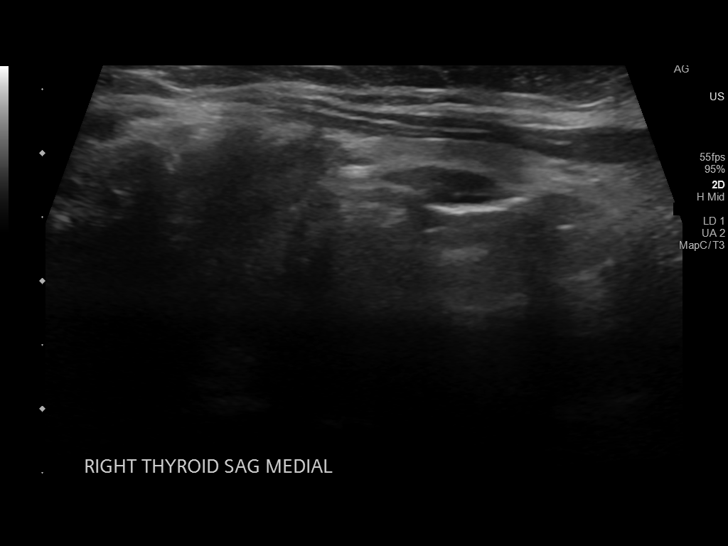
[im 76/114]
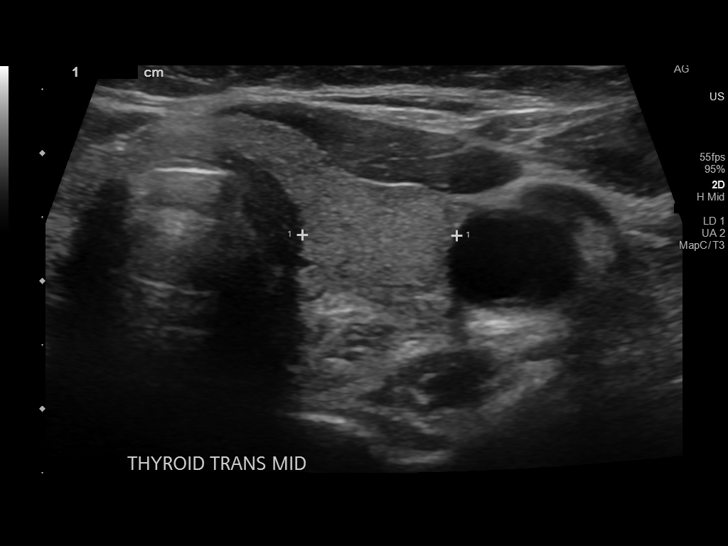
[im 85/114]
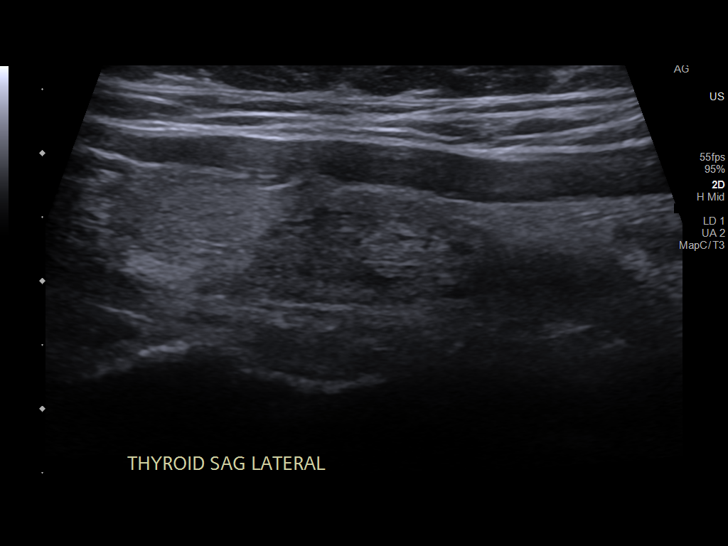
[im 95/114]
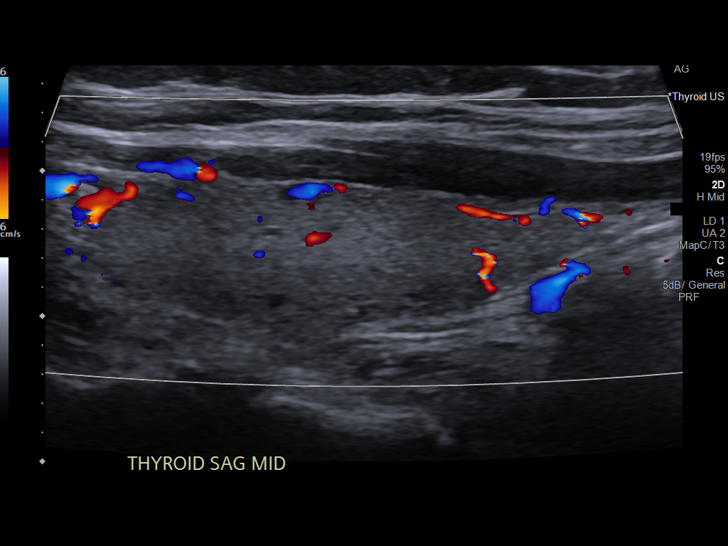
[im 104/114]
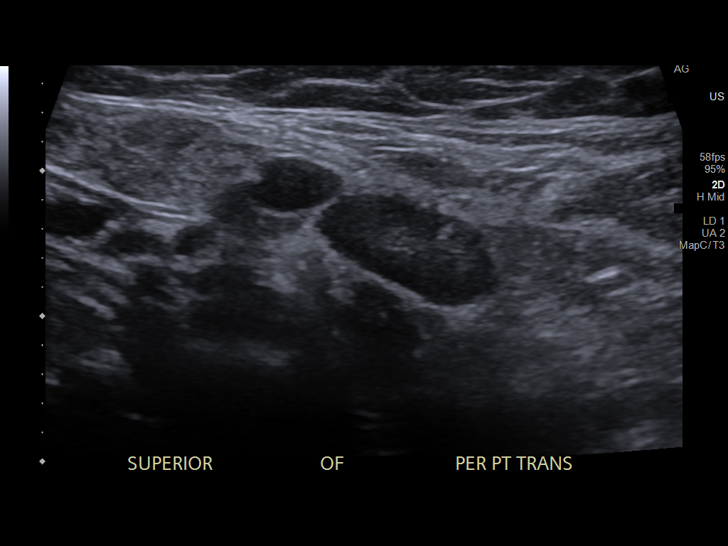
[im 114/114]
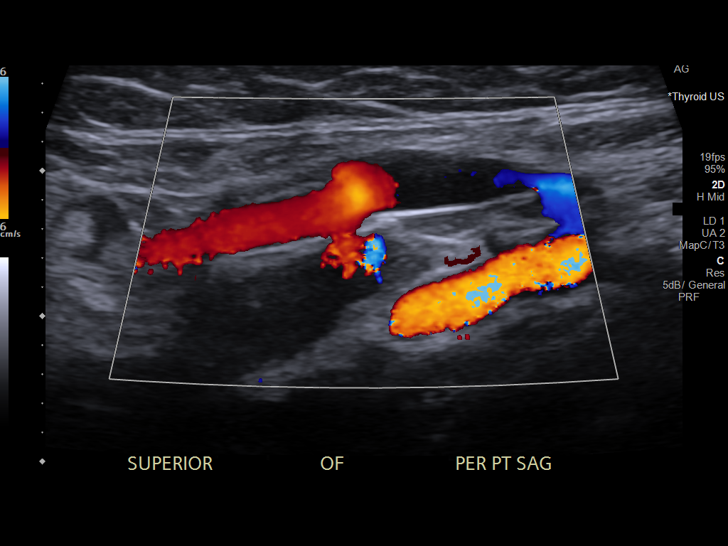

[13 of 25 positions shown; findings below may reference images not displayed]

FINDINGS: Parenchymal Echotexture: Mildly heterogenous

Isthmus: 0.3 cm, previously 0.3 cm

Right lobe: 4.0 x 1.6 x 1.2 cm, previously 4.1 x 1.1 x 1.2 cm

Left lobe: 3.9 x 1.0 x 1.2 cm, previously 4.2 x 1.1 x 1.3 cm

_________________________________________________________

Estimated total number of nodules >/= 1 cm: 1

Number of spongiform nodules >/=  2 cm not described below (TR1): 0

Number of mixed cystic and solid nodules >/= 1.5 cm not described
below (TR2): 0

_________________________________________________________

Nodule # 1:

Prior biopsy: No

Location: Isthmus; Inferior

Maximum size: 1.7 cm; Other 2 dimensions: 0.6 x 1.2 cm, previously,
1.8 x 1.2 x 1.7 cm

Composition: solid/almost completely solid (2)

Echogenicity: hypoechoic (2)

Shape: not taller-than-wide (0)

Margins: ill-defined (0)

Echogenic foci: none (0)

ACR TI-RADS total points: 4.

ACR TI-RADS risk category:  TR4 (4-6 points).

Significant change in size (>/= 20% in two dimensions and minimal
increase of 2 mm): No

Change in features: Yes; nodule is now hypoechoic.

Change in ACR TI-RADS risk category: Yes

ACR TI-RADS recommendations:

**Given size (>/= 1.5 cm) and appearance, fine needle aspiration of
this moderately suspicious nodule should be considered based on
TI-RADS criteria.

_________________________________________________________

Small lymph nodes at the area of swelling on the left side of the
neck. Index lymph node on left side of the neck measures 0.7 cm in
the short axis.
IMPRESSION: Exophytic isthmus nodule is now hypoechoic and compatible with a TR
4 lesion. Recommend ultrasound-guided biopsy of this exophytic
isthmus nodule.

No new thyroid nodules.

Small lymph nodes at the area of concern on the left side of the
neck.

The above is in keeping with the ACR TI-RADS recommendations - [HOSPITAL] 6017;[DATE].

## 2020-01-03 IMAGING — DX DG CHEST 2V
2 series · 2 of 2 positions shown · non-contrast
Comparison: 03/22/2018

CLINICAL DATA: Chest pain

EXAM:
CHEST - 2 VIEW

[chest pa]
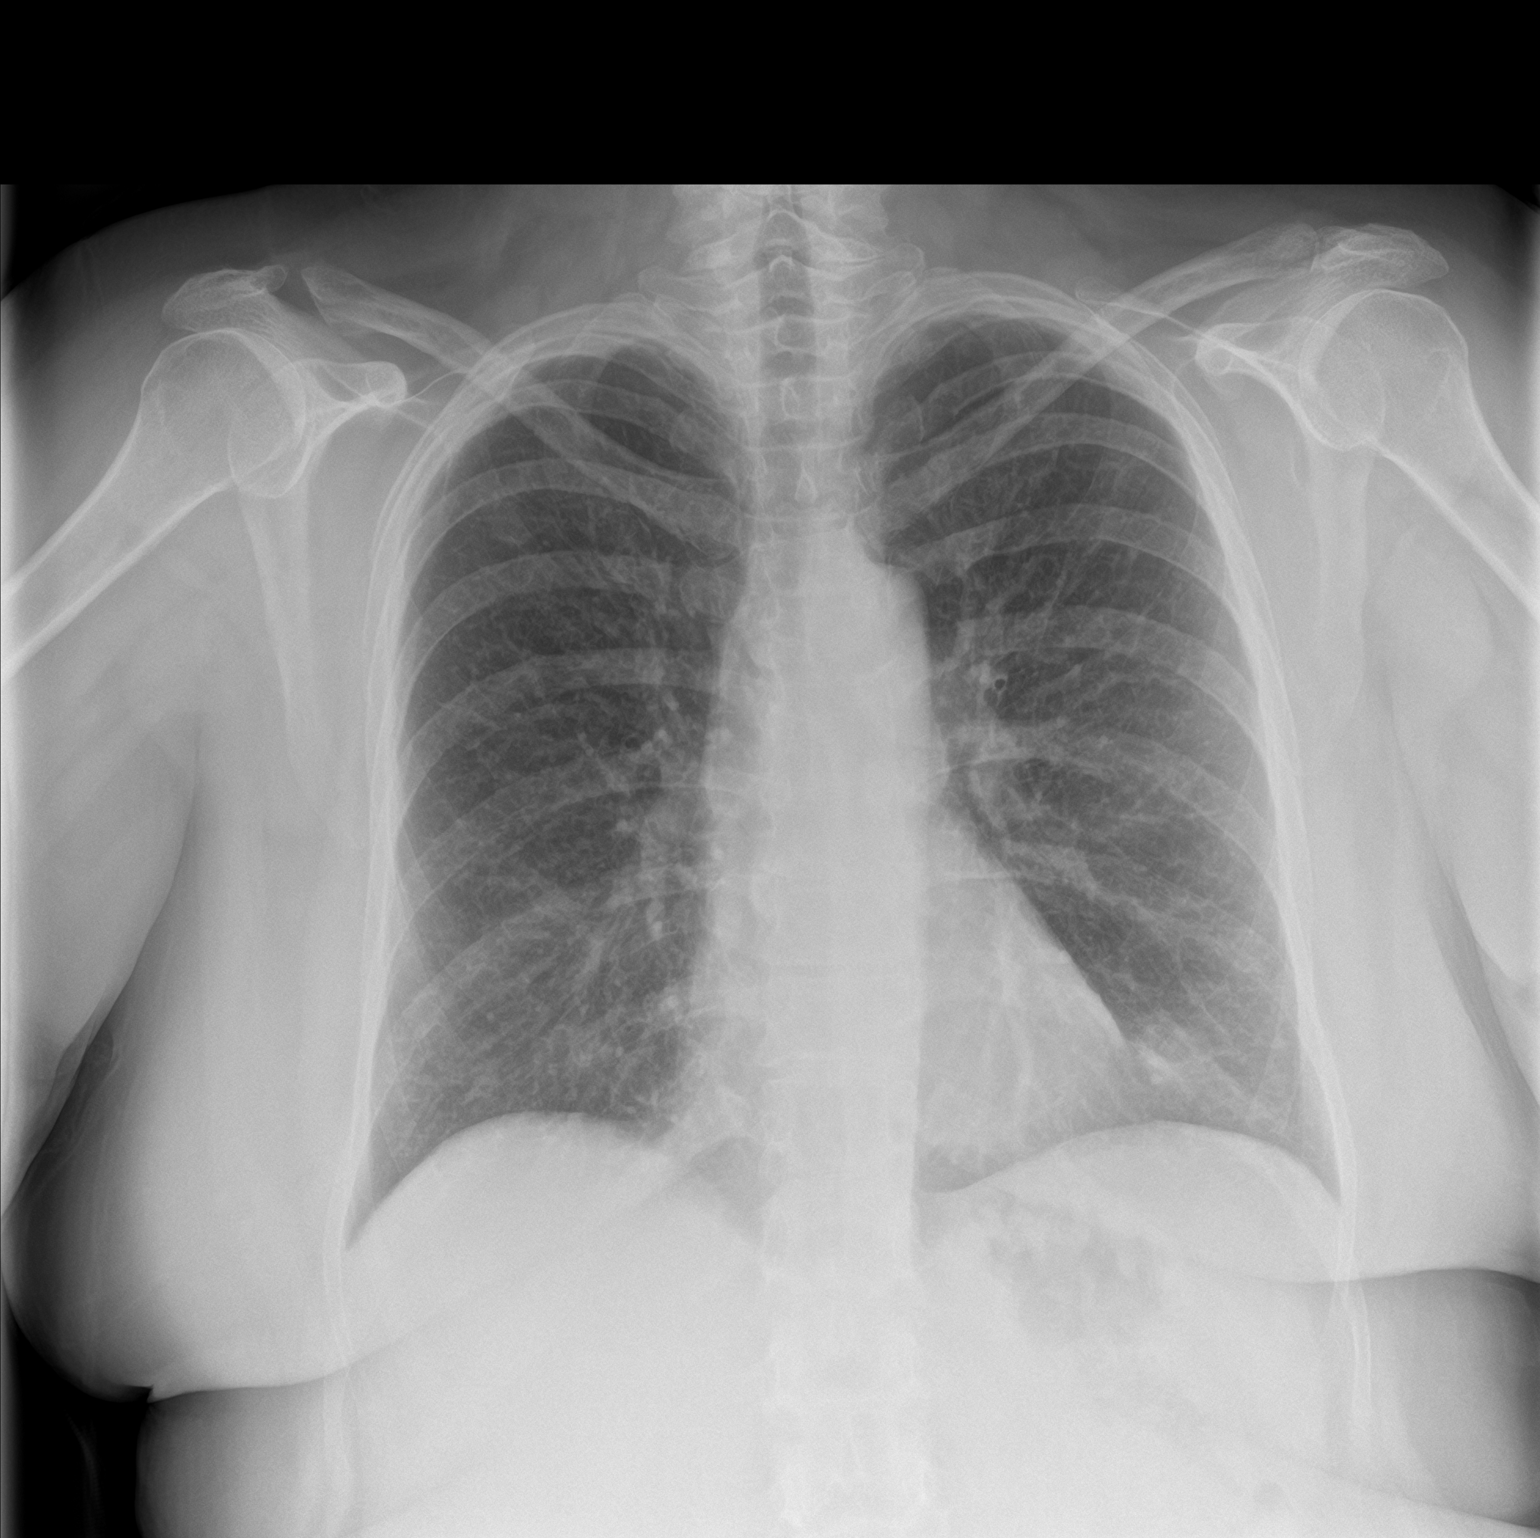

[chest lat]
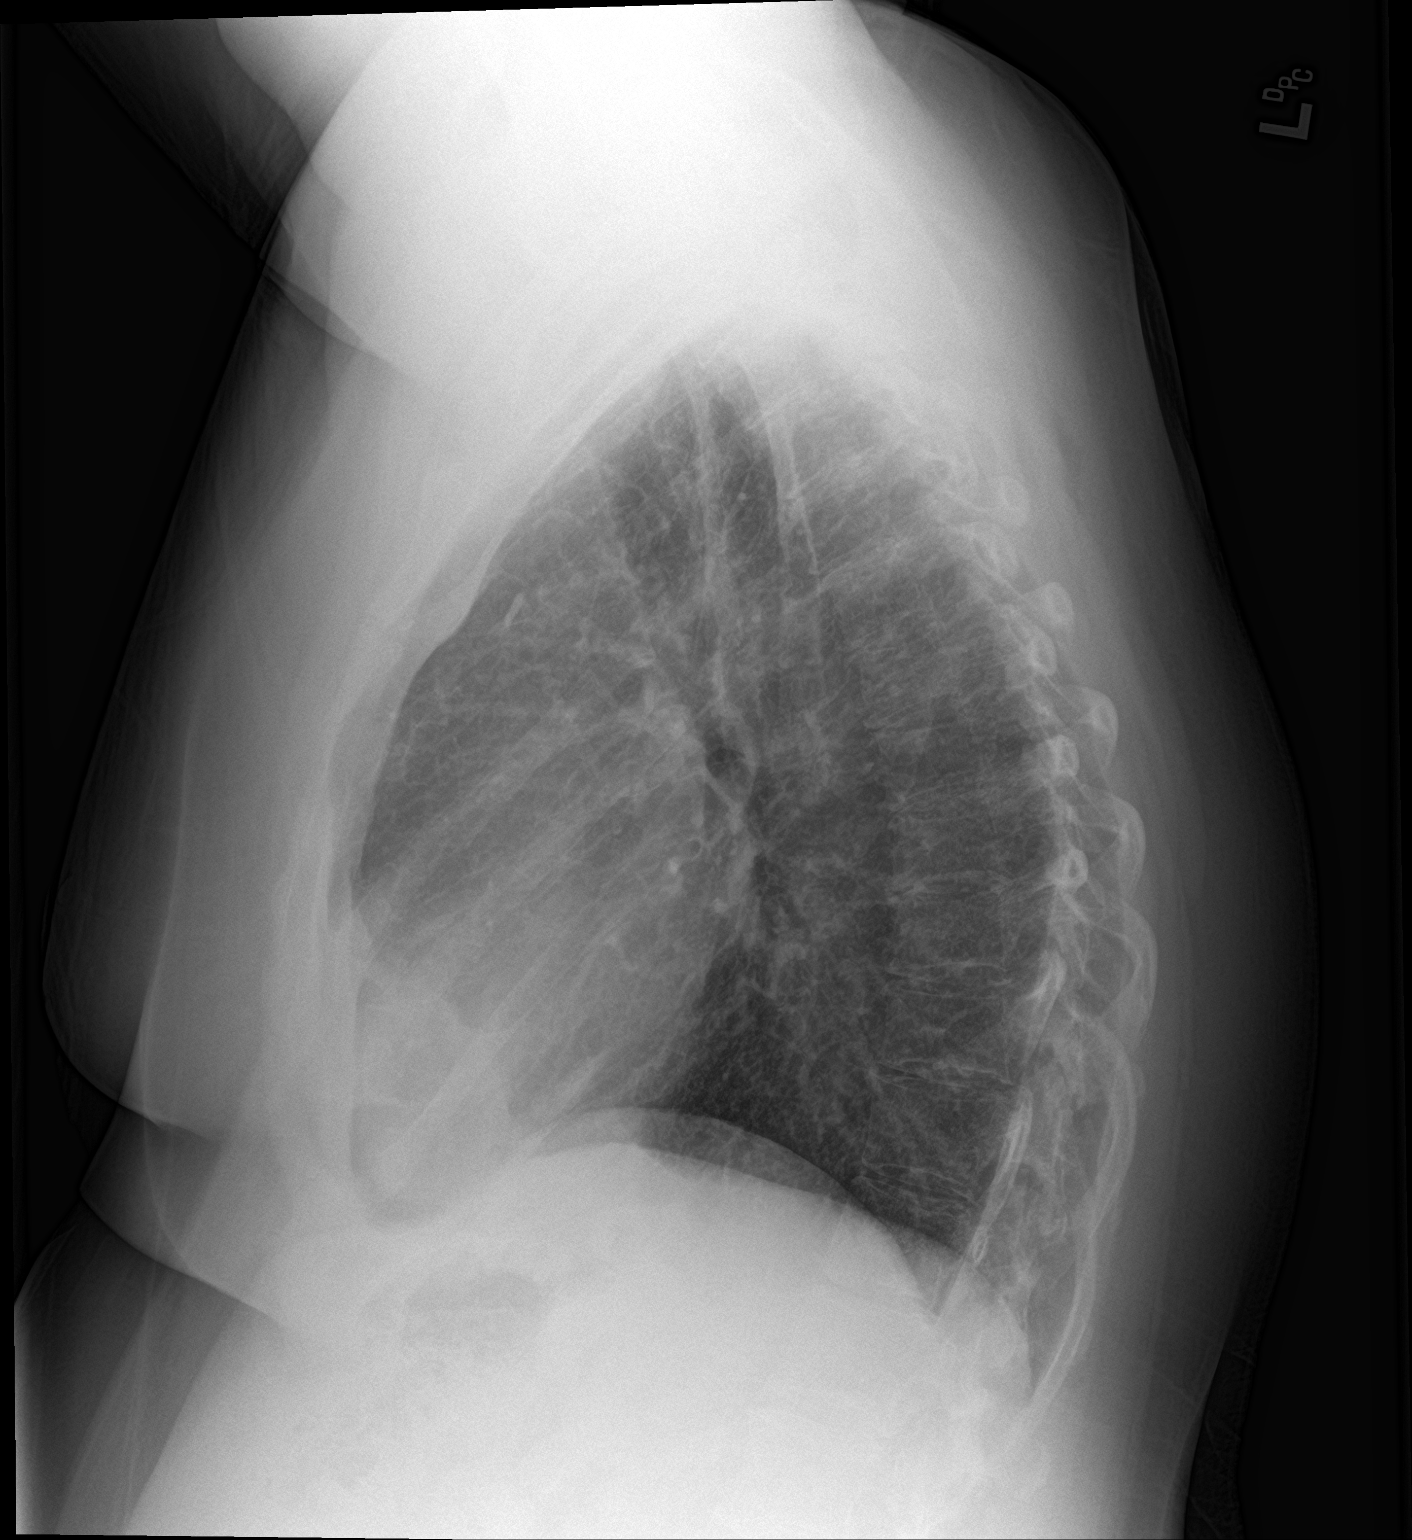

[2 of 2 positions shown; findings below may reference images not displayed]

FINDINGS: Hyperinflation with bronchitic changes. No focal opacity or pleural
effusion. Normal heart size. No pneumothorax.
IMPRESSION: No active cardiopulmonary disease. Hyperinflation with bronchitic
changes

## 2020-02-03 IMAGING — CT CT NECK W/ CM
4 series · 14 of 33 positions shown, 17 images · IV contrast (omnipaque)
Comparison: Thyroid ultrasound 05/11/2018 and earlier. Cervical
spine CT 04/05/2018. CTA head and neck 05/16/2017.

CLINICAL DATA: 57-year-old female status post needle biopsy of
thyroid isthmus nodule earlier this month. Preoperative study.

EXAM:
CT NECK WITH CONTRAST
TECHNIQUE: Multidetector CT imaging of the neck was performed using the
standard protocol following the bolus administration of intravenous
contrast.
CONTRAST:  75mL OMNIPAQUE IOHEXOL 300 MG/ML  SOLN

[Series 2: axial neck · axial · 0.52mm/px · z∈[-126,+50]mm · 5 of 132 slices shown, 7 images]
[im 22/132  soft-tissue]
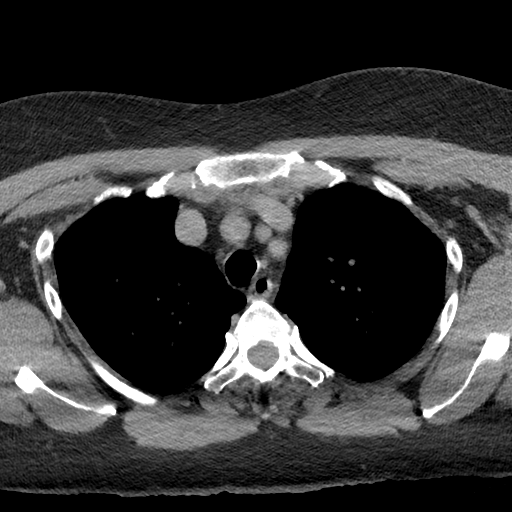
[im 22/132  bone]
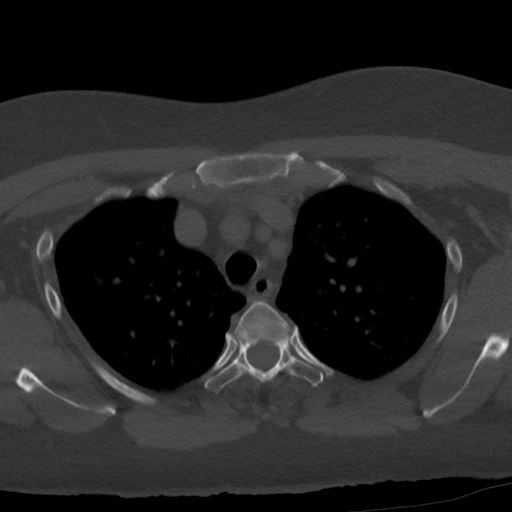
[im 44/132  bone]
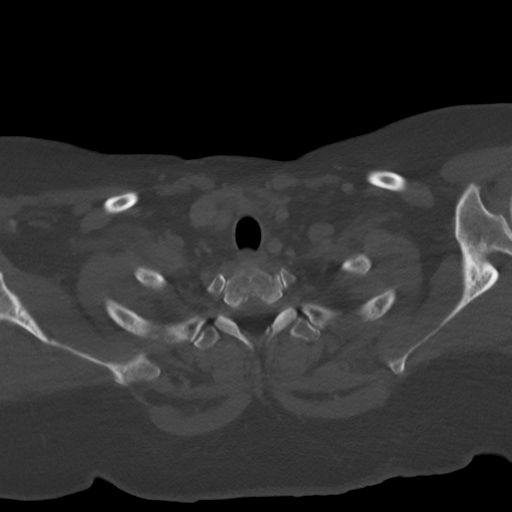
[im 66/132  bone]
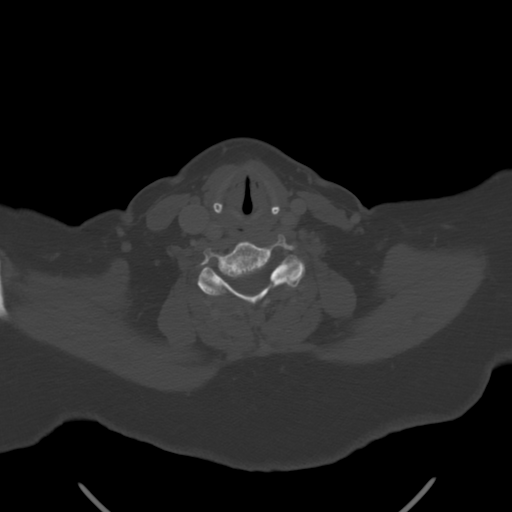
[im 88/132  bone]
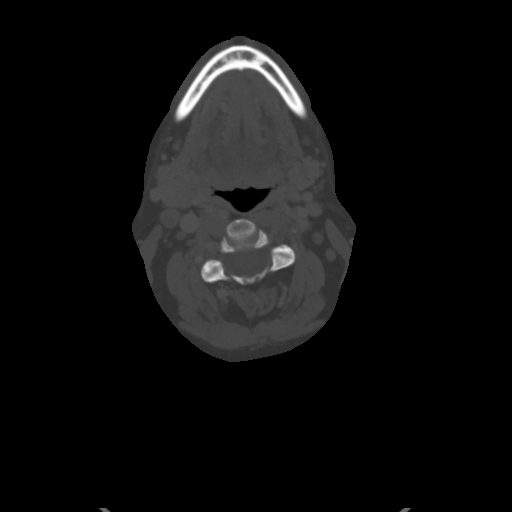
[im 110/132  soft-tissue]
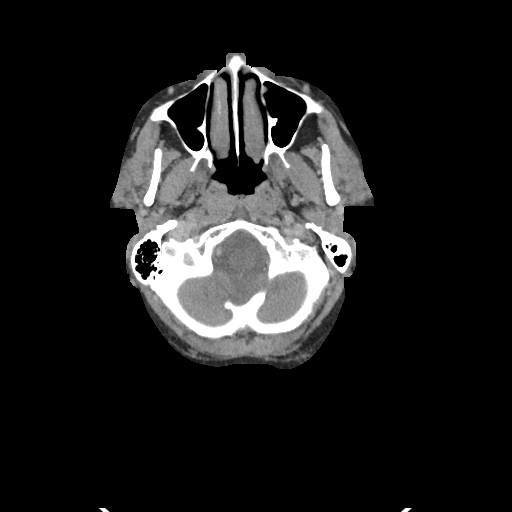
[im 110/132  bone]
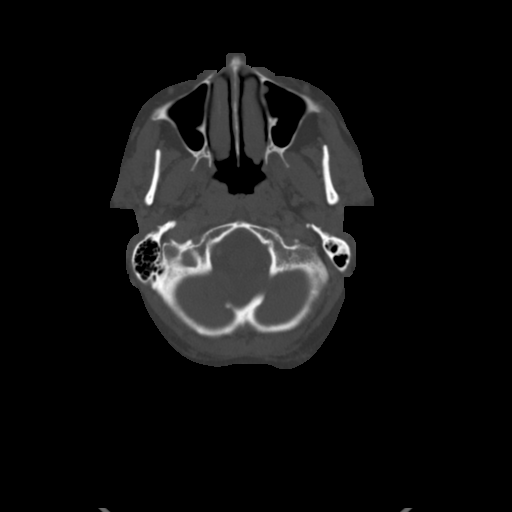

[Series 6: coronal neck · coronal · 0.49mm/px · 3 of 136 slices shown]
[im 37/136  bone]
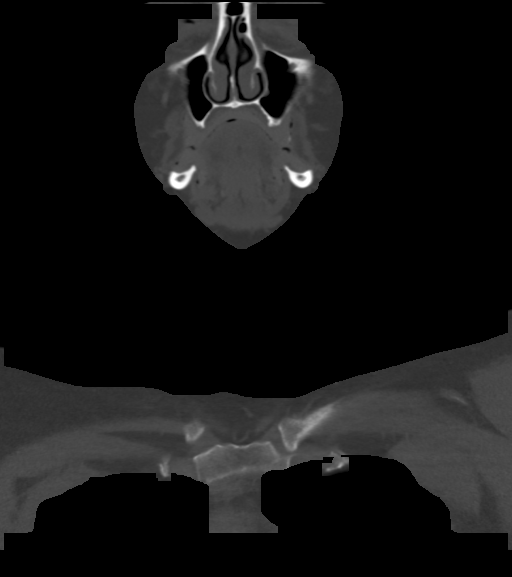
[im 58/136  bone]
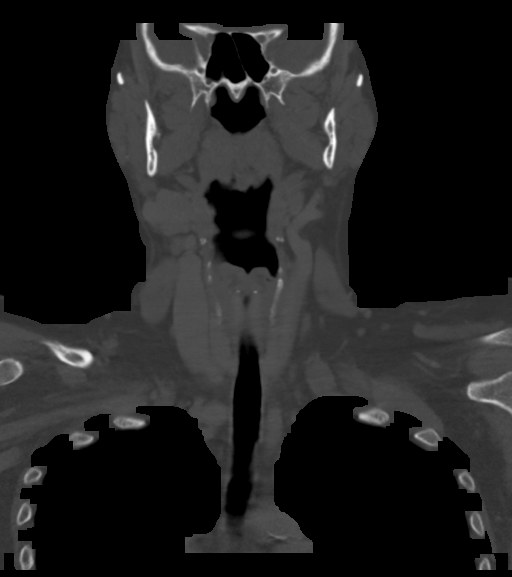
[im 79/136  bone]
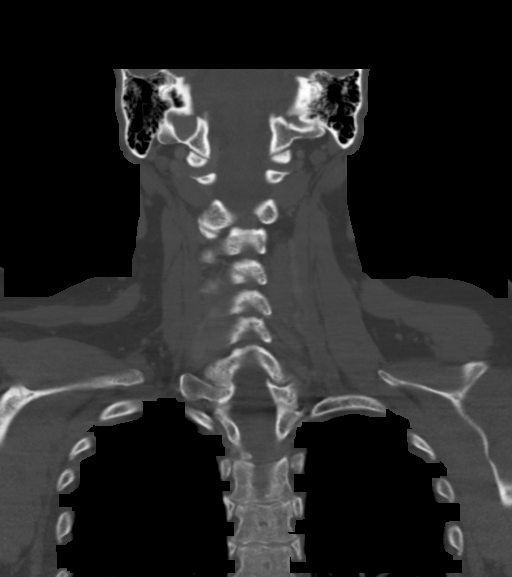

[Series 7: sagittal neck · sagittal · 0.54mm/px · 5 of 101 slices shown, 6 images]
[im 34/101  bone]
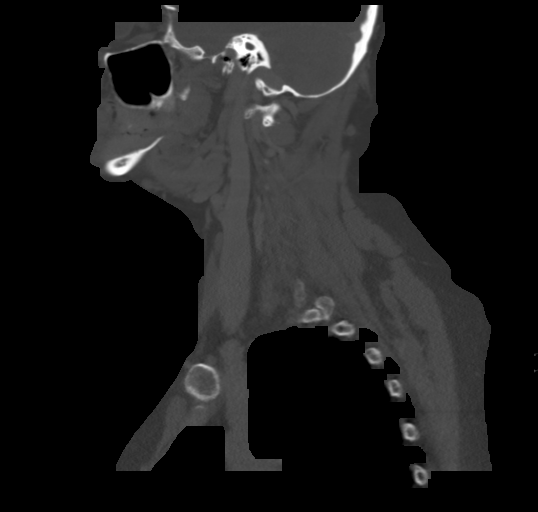
[im 42/101  bone]
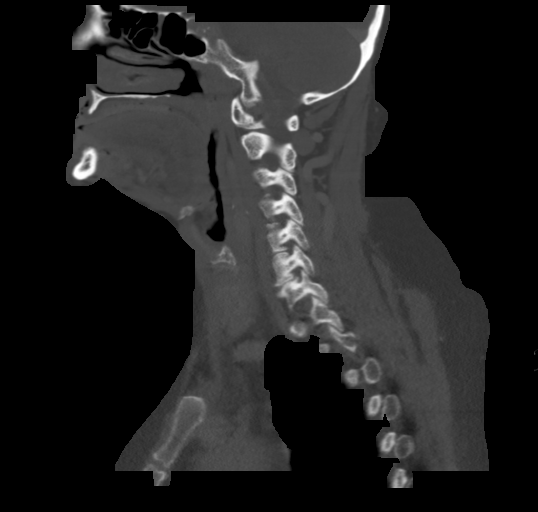
[im 51/101  soft-tissue]
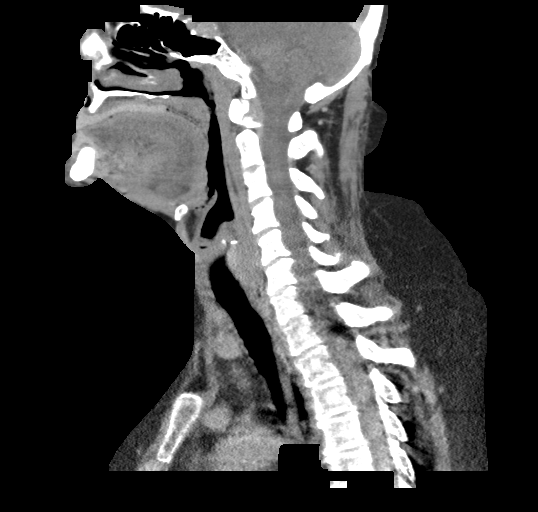
[im 51/101  bone]
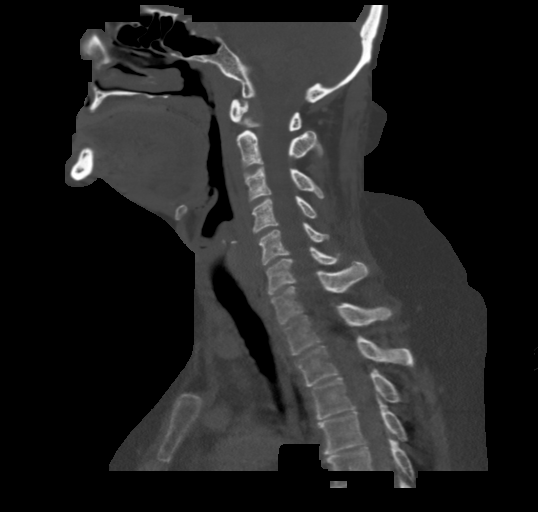
[im 59/101  bone]
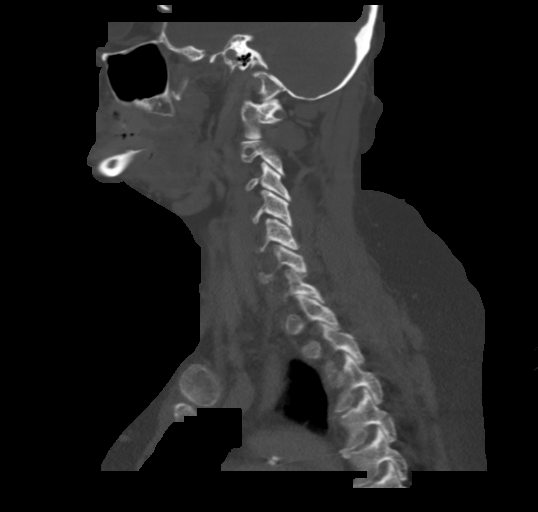
[im 67/101  bone]
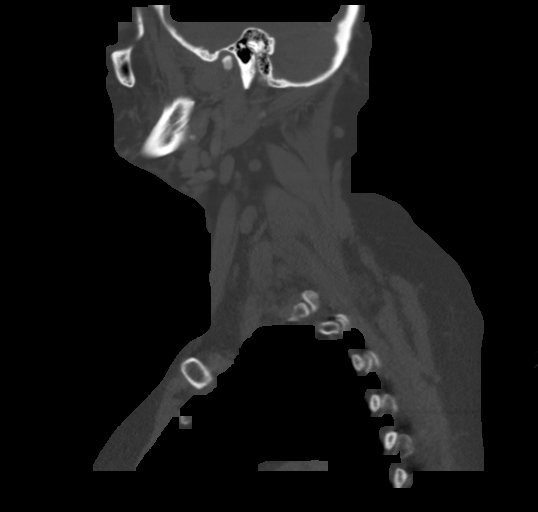

[Series 8: orthogonal ax · axial · 0.39mm/px · 1 of 144 slices shown]
[im 24/144  bone]
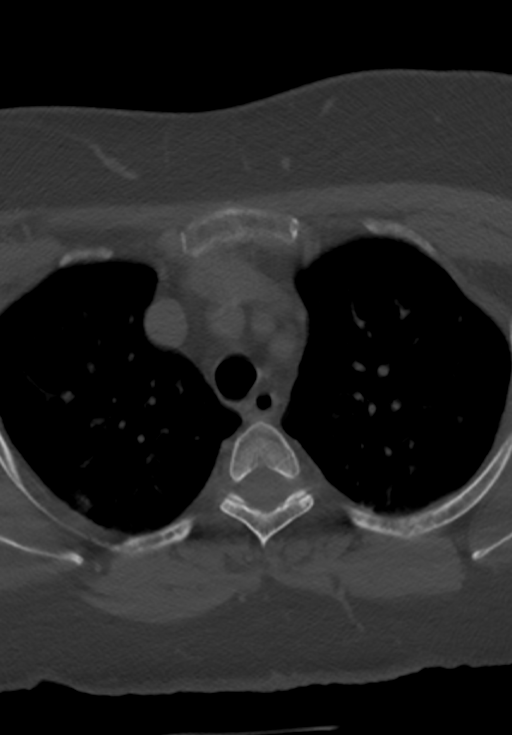

[14 of 33 positions shown; findings below may reference images not displayed]

FINDINGS: Pharynx and larynx: Negative laryngeal and pharyngeal soft tissue
contours. Negative parapharyngeal spaces. Negative retropharyngeal
space aside from a retropharyngeal course of the right carotid
(normal variant).

Salivary glands: Negative sublingual space. Submandibular glands are
within normal limits. Parotid glands are within normal limits.

Thyroid: Exophytic inferior thyroid isthmus nodule redemonstrated on
series 2, image 93 with mildly heterogeneous enhancement. This
measures about 19 millimeters diameter (coronal image 50). The left
and right thyroid lobes have a normal CT appearance.

Lymph nodes: No lymphadenopathy about the thyroid or at other
cervical lymph node stations. The largest node is a maximal 9-10
millimeter lymph node at the right level II a station which is
stable since 8921. No cystic or calcified nodes.

Vascular: Suboptimal intravascular contrast bolus today. The major
vascular structures in the neck and at the skull base appear to
remain patent. Bilateral carotid atherosclerosis and dominant right
vertebral artery which were better demonstrated on the 8921 CTA.

Limited intracranial: Negative.

Visualized orbits: Negative.

Mastoids and visualized paranasal sinuses: Visualized paranasal
sinuses and mastoids are stable and well pneumatized.

Skeleton: Absent dentition. No acute or suspicious osseous lesion.

Upper chest: Negative lung apices; minor dependent atelectasis.
Calcified aortic atherosclerosis. Stable and negative superior
mediastinum.
IMPRESSION: 1. Exophytic inferior thyroid isthmus nodule measuring up to 19 mm
re-demonstrated. No associated cervical or upper thoracic
lymphadenopathy.
2. Otherwise stable since 8921 and largely negative CT appearance of
the Neck.

## 2020-02-04 ENCOUNTER — Encounter: Payer: Self-pay | Admitting: Emergency Medicine

## 2020-02-04 ENCOUNTER — Other Ambulatory Visit: Payer: Self-pay

## 2020-02-04 ENCOUNTER — Ambulatory Visit
Admission: EM | Admit: 2020-02-04 | Discharge: 2020-02-04 | Disposition: A | Discharge status: Home / Self Care | Payer: Medicare Other | Attending: Emergency Medicine | Admitting: Emergency Medicine

## 2020-02-04 DIAGNOSIS — R059 Cough, unspecified: Secondary | ICD-10-CM

## 2020-02-04 DIAGNOSIS — R05 Cough: Secondary | ICD-10-CM

## 2020-02-04 DIAGNOSIS — Z20822 Contact with and (suspected) exposure to covid-19: Secondary | ICD-10-CM

## 2020-02-04 DIAGNOSIS — J069 Acute upper respiratory infection, unspecified: Secondary | ICD-10-CM

## 2020-02-04 MED ORDER — BENZONATATE 100 MG PO CAPS
100.0000 mg | ORAL_CAPSULE | Freq: Three times a day (TID) | ORAL | 0 refills | Status: DC
Start: 2020-02-04 — End: 2020-03-13

## 2020-02-04 NOTE — ED Triage Notes (Signed)
Sore throat, cough, body aches, headache.  Has been exposed to covid 2 x recently

## 2020-02-04 NOTE — Discharge Instructions (Signed)
COVID testing ordered.  It will take between 5-7 days for test results.  Someone will contact you regarding abnormal results.    In the meantime: You should remain isolated in your home for 10 days from symptom onset AND greater than 72 hours after symptoms resolution (absence of fever without the use of fever-reducing medication and improvement in respiratory symptoms), whichever is longer Get plenty of rest and push fluids Tessalon Perles prescribed for cough Use OTC zyrtec for nasal congestion, runny nose, and/or sore throat Use OTC flonase for nasal congestion and runny nose Use medications daily for symptom relief Use OTC medications like ibuprofen or tylenol as needed fever or pain Call or go to the ED if you have any new or worsening symptoms such as fever, worsening cough, shortness of breath, chest tightness, chest pain, turning blue, changes in mental status, etc...  

## 2020-02-04 NOTE — ED Provider Notes (Signed)
[facility]   [id number] 02/04/20 Arrival Time: 7782   CC: COVID symptoms  SUBJECTIVE: History from: patient.  Katrina [name] is a 59 y.o. female who presents with sore throat, cough, body aches, and headache x 4 days.  Complains of COVID exposure.  Has tried OTC medications with temporary relief.  Denies aggravating factors.  Reports previous symptoms in the past with COVID infection.   Denies SOB, wheezing, chest pain, nausea, changes in bowel or bladder habits.    ROS: As per HPI.  All other pertinent ROS negative.     Past Medical History:  Diagnosis Date  . Allergy   . Anemia 1975-1976   . Anxiety    takes Valium daily as needed  . Arthritis    "spine" (12/03/2016)  . Asthma    has inhalers but doesn't use (12/03/2016)  . Chronic bronchitis ([place])    "get it a couple times q yr" (12/03/2016)  . Chronic kidney disease   . Chronic lower back pain    budlging disc   . Claustrophobia   . COPD (chronic obstructive pulmonary disease) ([place])   . Daily headache   . Depression   . Diverticulitis   . Family history of adverse reaction to anesthesia    2 daughters gets extremely sick   . GERD (gastroesophageal reflux disease)    takes Dexilant daily  . Heart murmur   . History of blood transfusion 1975-1976 "several"   no abnormal reaction noted  . History of colitis   . History of colon polyps    benign  . History of gastric ulcer   . History of hiatal hernia    "small one"  . History of MRSA infection 2017  . Hyperlipidemia    was on meds but has been off over a yr  . Hypertension    takes Amlodipine and Maxzide daily  . Insomnia    takes Ambien nightly  . Iron deficiency anemia    "when I was young"  . Lung nodules   . Migraine    "2-3/wk" (12/03/2016)  . MS (multiple sclerosis) ([place])    questionable per pt  . Noncompliance   . Osteoporosis   . Peripheral neuropathy    weakness,numbness,and tingling. Takes Gabapentin daily  . PTSD (post-traumatic  stress disorder) dx'd 2016/2017   "was dx'd w/bipolar in 1991; replaced w/PTSD dx 2016/2017" (12/03/2016)  . Restless leg syndrome    takes Requip daily  . 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 Type II diabetes mellitus ([place])    "went off insulin in 2012/2013" (12/03/2016)   Past Surgical History:  Procedure Laterality Date  . ABDOMINAL AORTOGRAM N/A 12/03/2016   Procedure: Abdominal Aortogram;  Surgeon: Angelia Mould, MD;  Location: [facility];  Service: Cardiovascular;  Laterality: N/A;  . ABDOMINAL HYSTERECTOMY  12/1987  . ADENOIDECTOMY  1975  . ANKLE SURGERY Bilateral 1993; 1995 X2   "stabilzation done; 1 on the right; 2 on the left"  . APPENDECTOMY  1989  . BREAST EXCISIONAL BIOPSY Left   . BREAST LUMPECTOMY Left    "benign tumor"  . CARDIAC CATHETERIZATION N/A 01/31/2015   Procedure: Left Heart Cath and Coronary Angiography;  Surgeon: Burnell Blanks, MD;  Location: [facility];  Service: Cardiovascular;  Laterality: N/A;  . COLONOSCOPY    . DILATION AND CURETTAGE OF UTERUS    .  ESOPHAGOGASTRODUODENOSCOPY    . LAPAROSCOPIC CHOLECYSTECTOMY  1996  . NASAL RECONSTRUCTION  1976  . NASAL SINUS SURGERY  1975  . POSTERIOR LUMBAR FUSION  2005  . RADIOLOGY WITH ANESTHESIA N/A 01/15/2016   Procedure: MRI LUMBAR SPINE WITHOUT;  Surgeon: Medication Radiologist, MD;  Location: [facility];  Service: Radiology;  Laterality: N/A;  . RADIOLOGY WITH ANESTHESIA N/A 06/29/2016   Procedure: MRI OF THE BRAIN WITH AND WITHOUT;  Surgeon: Medication Radiologist, MD;  Location: [facility];  Service: Radiology;  Laterality: N/A;  . RENAL ANGIOGRAPHY N/A 12/03/2016   Procedure: Renal Angiography;  Surgeon: Angelia Mould, MD;  Location: [facility];  Service: Cardiovascular;  Laterality: N/A;  . TONSILLECTOMY  1992  . TUBAL LIGATION    . 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 Cymbalta [Duloxetine Hcl] Shortness Of Breath  . Other Anaphylaxis and Swelling    [place]  . Penicillins Anaphylaxis    Has patient had a PCN reaction causing immediate rash, facial/tongue/throat swelling, SOB or lightheadedness with hypotension: Yes Has patient had a PCN reaction causing severe rash involving mucus membranes or skin necrosis: Yes Has patient had a PCN reaction that required hospitalization Yes Has patient had a PCN reaction occurring within the last 10 years: No If all of the above answers are "NO", then may proceed with Cephalosporin use.   . Zithromax [Azithromycin] Anaphylaxis  . Aspirin Other (See Comments)    Due to stomach ulcers.   . Pineapple Rash  . Strawberry Extract Rash and Hives  . Spironolactone Other (See Comments)    Dehydration per patient's report  . Aspartame And Phenylalanine Palpitations  . Mushroom Extract Complex Rash  . Nicardipine Nausea And Vomiting and Other (See Comments)    shaking   No current facility-administered medications on file prior to encounter.   Current Outpatient Medications on File Prior to Encounter  Medication Sig Dispense Refill  . amLODipine (NORVASC) 10 MG tablet Take 1 tablet (10 mg total) by mouth daily. (Patient taking differently: Take 10 mg by mouth every morning. ) 30 tablet 6  . aspirin-acetaminophen-caffeine (EXCEDRIN EXTRA STRENGTH) 250-250-65 MG tablet Take 2 tablets by mouth daily as needed for headache or migraine.     . clopidogrel (PLAVIX) 75 MG tablet Take 75 mg by mouth every morning.     Marland Kitchen dexlansoprazole (DEXILANT) 60 MG capsule Take 1 tab by mouth every morning. (Patient taking differently: Take 60 mg by mouth every morning. ) 30 capsule 1  . EPINEPHrine (EPIPEN 2-PAK) 0.3 mg/0.3 mL IJ SOAJ injection Inject 0.3 mg into the muscle once as needed (for allergic reaction).     . hydrALAZINE (APRESOLINE) 100 MG  tablet Take 1 tablet (100 mg total) by mouth 2 (two) times daily. 180 tablet 3  . labetalol (NORMODYNE) 200 MG tablet Take 1 tablet (200 mg total) by mouth 2 (two) times daily. 180 tablet 3  . metFORMIN (GLUCOPHAGE) 500 MG tablet Take 250 mg by mouth daily.    . montelukast (SINGULAIR) 10 MG tablet Take 10 mg by mouth at bedtime.    . rizatriptan (MAXALT) 10 MG tablet     . rOPINIRole (REQUIP) 4 MG tablet Take 4 mg by mouth at bedtime.    . rosuvastatin (CRESTOR) 5 MG tablet Take 5 mg by mouth every morning.     Marland Kitchen tiZANidine (ZANAFLEX) 4  MG tablet Take 0.5-1 tablets (2-4 mg total) by mouth at bedtime as needed for muscle spasms. 30 tablet 2  . zolpidem (AMBIEN CR) 12.5 MG CR tablet Take 12.5 mg by mouth at bedtime.     Social History   Socioeconomic History  . Marital status: Divorced    Spouse name: Not on file  . Number of children: 3  . Years of education: Some college  . Highest education level: Not on file  Occupational History    Comment: Disability for back pain  Tobacco Use  . Smoking status: Current Every Day Smoker    Packs/day: 1.00    Years: 45.00    Pack years: 45.00    Types: Cigarettes    Start date: 03/04/1971  . Smokeless tobacco: Never Used  Vaping Use  . Vaping Use: Never used  Substance and Sexual Activity  . Alcohol use: No    Alcohol/week: 0.0 standard drinks  . Drug use: No  . Sexual activity: Never    Birth control/protection: Surgical  Other Topics Concern  . Not on file  Social History Narrative   Lives at home with her daughter.   Right-handed.   1-2 cups coffee in the morning and 2 sodas per day.   Social Determinants of Health   Financial Resource Strain:   . Difficulty of Paying Living Expenses: Not on file  Food Insecurity:   . Worried About Charity fundraiser in the Last Year: Not on file  . Ran Out of Food in the Last Year: Not on file  Transportation Needs:   . Lack of Transportation (Medical): Not on file  . Lack of Transportation  (Non-Medical): Not on file  Physical Activity:   . Days of Exercise per Week: Not on file  . Minutes of Exercise per Session: Not on file  Stress:   . Feeling of Stress : Not on file  Social Connections:   . Frequency of Communication with Friends and Family: Not on file  . Frequency of Social Gatherings with Friends and Family: Not on file  . Attends Religious Services: Not on file  . Active Member of Clubs or Organizations: Not on file  . Attends Archivist Meetings: Not on file  . Marital Status: Not on file  Intimate Partner Violence:   . Fear of Current or Ex-Partner: Not on file  . Emotionally Abused: Not on file  . Physically Abused: Not on file  . Sexually Abused: Not on file   Family History  Problem Relation Age of Onset  . Coronary artery disease Father   . Emphysema Father   . Heart attack Father 53       Died age 60  . Stroke Father   . Cancer Father        Unsure of type   . Allergic rhinitis Father   . Depression Mother   . Skin cancer Mother   . Bipolar disorder Brother   . Drug abuse Brother   . Leukemia Brother   . Bipolar disorder Daughter   . Allergic rhinitis Daughter   . Asthma Daughter   . Urticaria Daughter   . Diabetes Maternal Grandmother   . Stomach cancer Maternal Grandfather   . Heart attack Sister        Died in 47s  . Heart attack Brother        Died age 8  . Stomach cancer Other   . Liver disease Cousin   . Angioedema Neg  Hx   . Atopy Neg Hx   . Eczema Neg Hx   . Immunodeficiency Neg Hx   . Breast cancer Neg Hx   . Colon cancer Neg Hx     OBJECTIVE:  Vitals:   02/04/20 1610 02/04/20 1613  BP:  (!) 193/98  Pulse:  85  Resp:  19  Temp:  98.3 F (36.8 C)  TempSrc:  Oral  SpO2:  98%  Weight: 214 lb 15.2 oz (97.5 kg)   Height: 5\' 7"  (1.702 m)      General appearance: alert; appears mildly fatigued, but nontoxic; speaking in full sentences and tolerating own secretions HEENT: NCAT; Ears: EACs clear, TMs pearly  gray; Eyes: PERRL.  EOM grossly intact. Nose: nares patent without rhinorrhea, Throat: oropharynx clear, tonsils non erythematous or enlarged, uvula midline  Neck: supple without LAD Lungs: unlabored respirations, symmetrical air entry; cough: absent; no respiratory distress; CTAB Heart: regular rate and rhythm.   Skin: warm and dry Psychological: alert and cooperative; normal mood and affect  ASSESSMENT & PLAN:  1. Cough   2. Viral URI with cough   3. Suspected COVID-19 virus infection     Meds ordered this encounter  Medications  . benzonatate (TESSALON) 100 MG capsule    Sig: Take 1 capsule (100 mg total) by mouth every 8 (eight) hours.    Dispense:  21 capsule    Refill:  0    Order Specific Question:   Supervising Provider    Answer:   Raylene Everts [[id number]]   COVID testing ordered.  It will take between 5-7 days for test results.  Someone will contact you regarding abnormal results.    In the meantime: You should remain isolated in your home for 10 days from symptom onset AND greater than 72 hours after symptoms resolution (absence of fever without the use of fever-reducing medication and improvement in respiratory symptoms), whichever is longer Get plenty of rest and push fluids Tessalon Perles prescribed for cough Use OTC zyrtec for nasal congestion, runny nose, and/or sore throat Use OTC flonase for nasal congestion and runny nose Use medications daily for symptom relief Use OTC medications like ibuprofen or tylenol as needed fever or pain Call or go to the ED if you have any new or worsening symptoms such as fever, worsening cough, shortness of breath, chest tightness, chest pain, turning blue, changes in mental status, etc...   Reviewed expectations re: course of current medical issues. Questions answered. Outlined signs and symptoms indicating need for more acute intervention. Patient verbalized understanding. After Visit Summary given.         Lestine Box, PA-C 02/04/20 1653

## 2020-02-06 LAB — NOVEL CORONAVIRUS, NAA: SARS-CoV-2, NAA: NOT DETECTED

## 2020-02-20 ENCOUNTER — Other Ambulatory Visit: Payer: Self-pay

## 2020-02-20 ENCOUNTER — Other Ambulatory Visit: Payer: Medicaid Other

## 2020-02-20 DIAGNOSIS — Z20822 Contact with and (suspected) exposure to covid-19: Secondary | ICD-10-CM

## 2020-02-21 IMAGING — DX DG CHEST 2V
3 series · 3 of 3 positions shown · non-contrast
Comparison: May 01, 2018

CLINICAL DATA: Chest pain and hypertension.  Cough.

EXAM:
CHEST - 2 VIEW

[chest pa (1 of 2)]
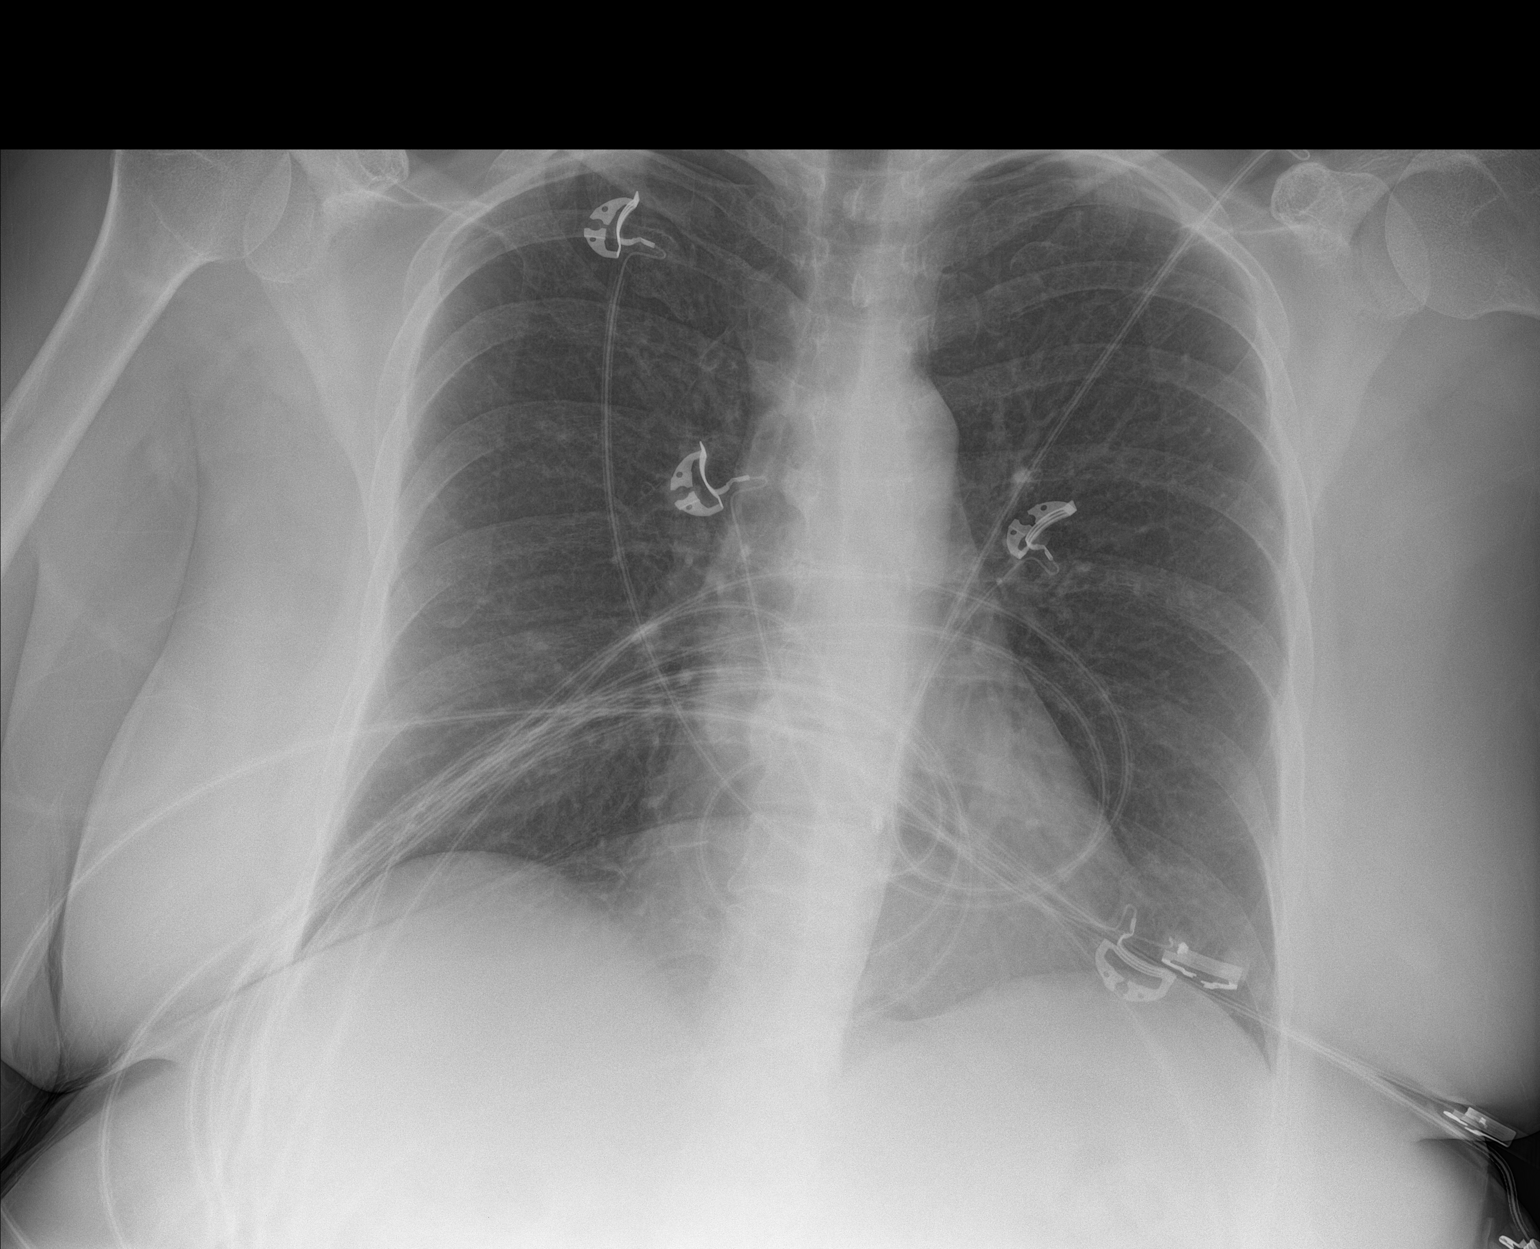

[chest lat]
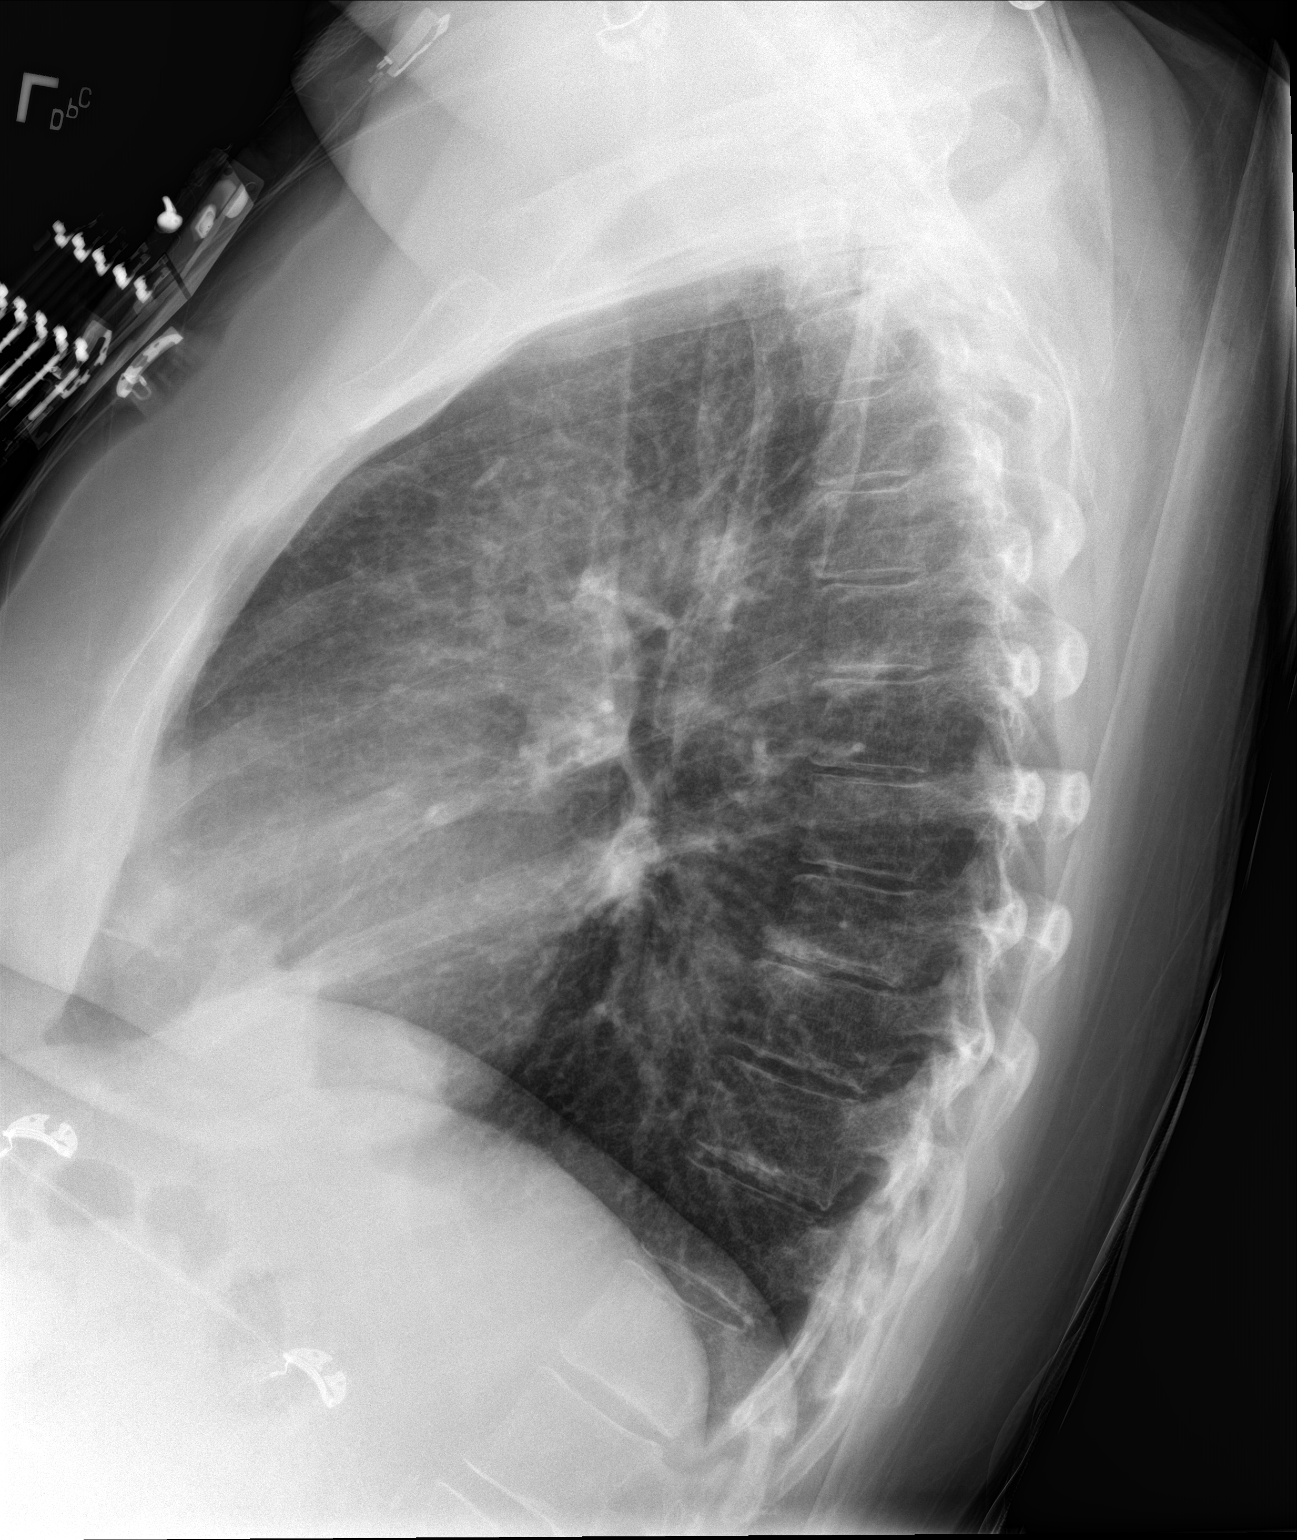

[chest pa (2 of 2)]
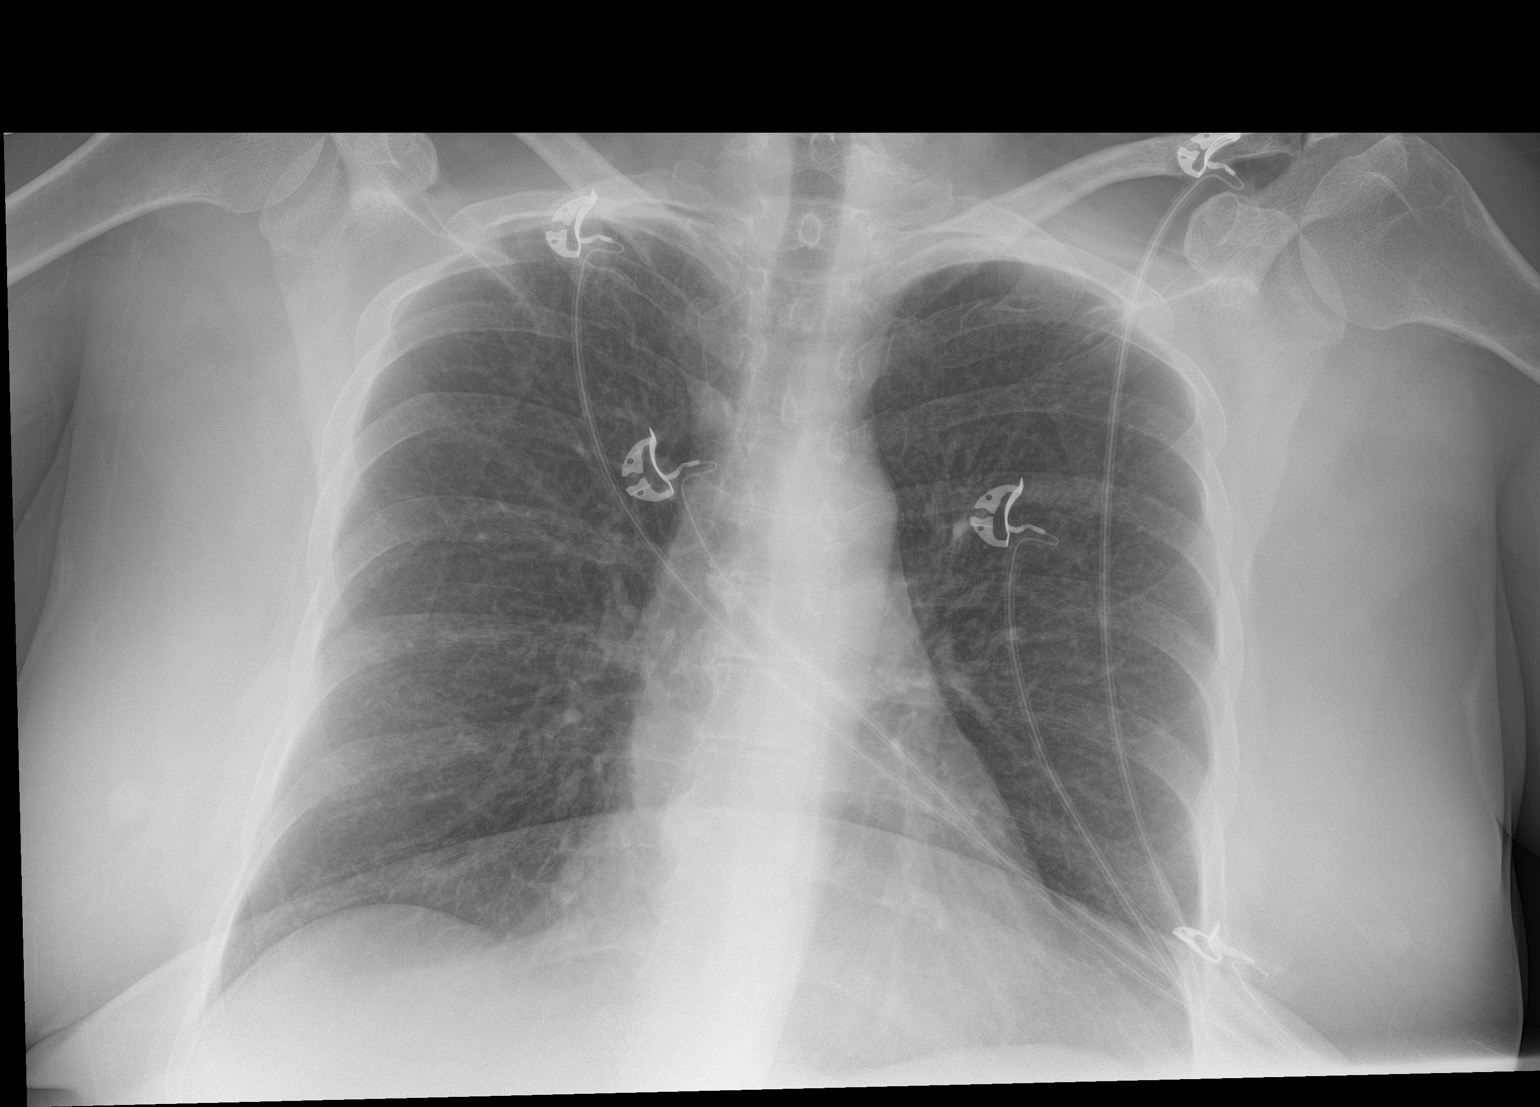

[3 of 3 positions shown; findings below may reference images not displayed]

FINDINGS: There is no edema or consolidation. The heart size and pulmonary
vascularity are normal. No adenopathy. No pneumothorax. No bone
lesions.
IMPRESSION: No edema or consolidation.

## 2020-02-21 IMAGING — CT CT HEAD W/O CM
3 series · 17 of 47 positions shown, 20 images · non-contrast
Comparison: 04/05/2018

CLINICAL DATA: Headache for several hours. History of stroke.

EXAM:
CT HEAD WITHOUT CONTRAST
TECHNIQUE: Contiguous axial images were obtained from the base of the skull
through the vertex without intravenous contrast.

[Series 2: head wo · axial · 0.40mm/px · z∈[+1289,+1419]mm · 11 of 32 slices shown, 14 images]
[im 3/32  brain]
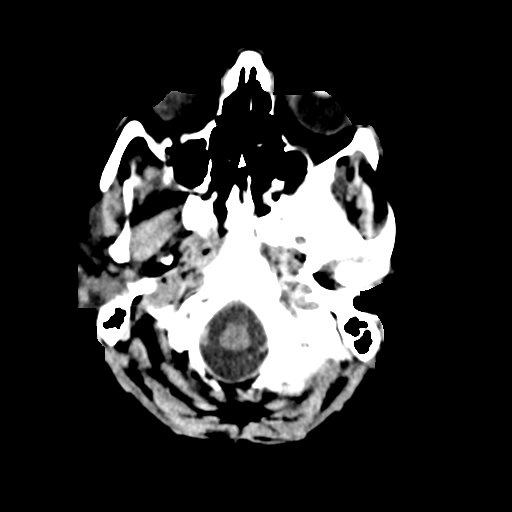
[im 3/32  bone]
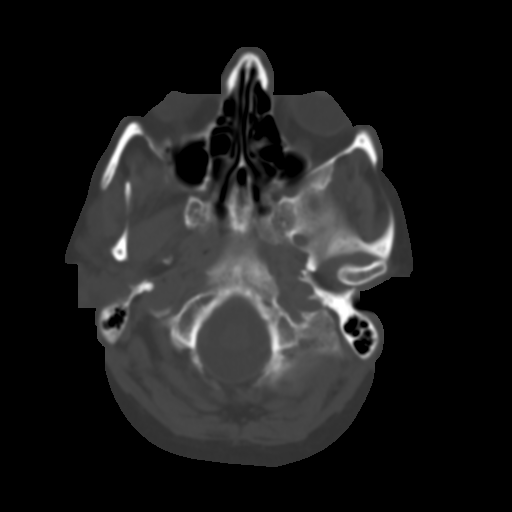
[im 5/32  brain]
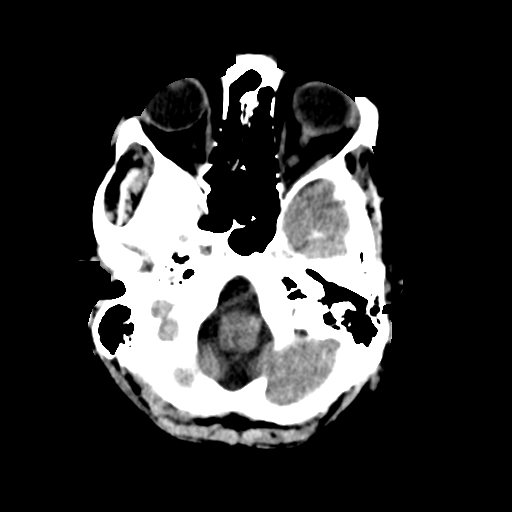
[im 8/32  brain]
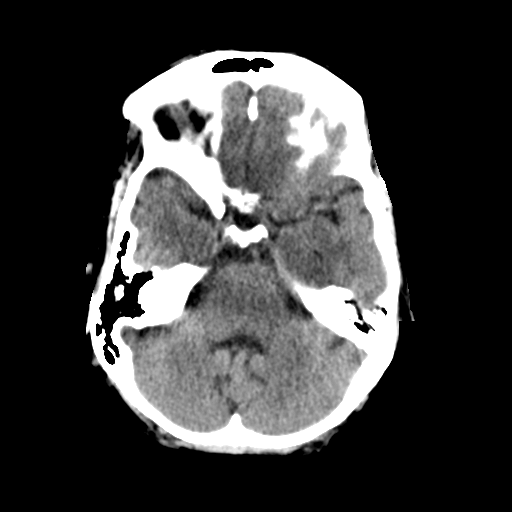
[im 10/32  brain]
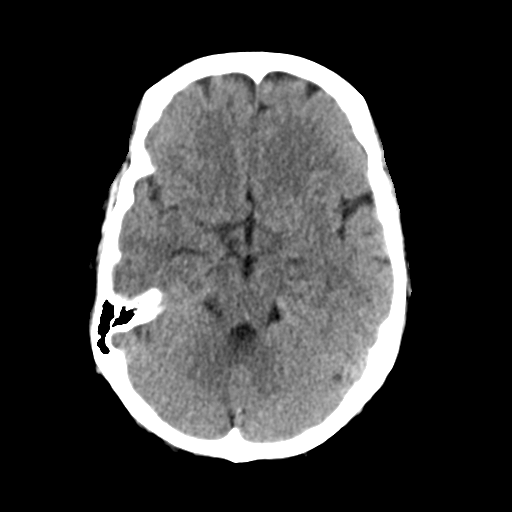
[im 13/32  brain]
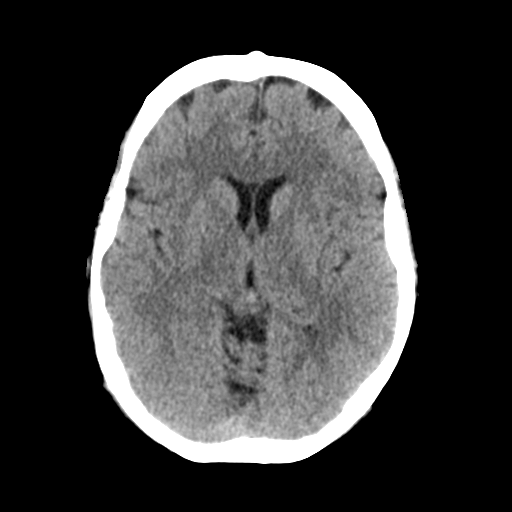
[im 13/32  bone]
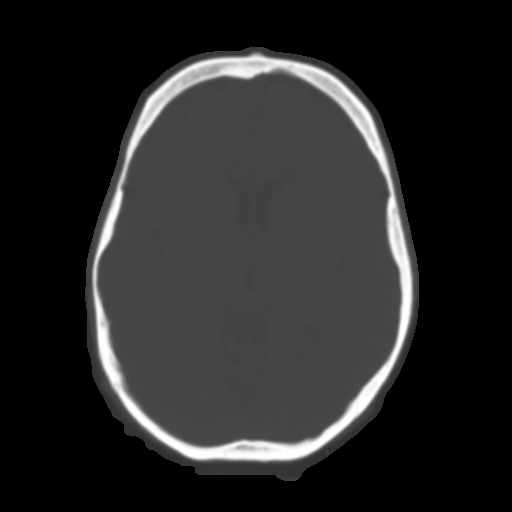
[im 17/32  brain]
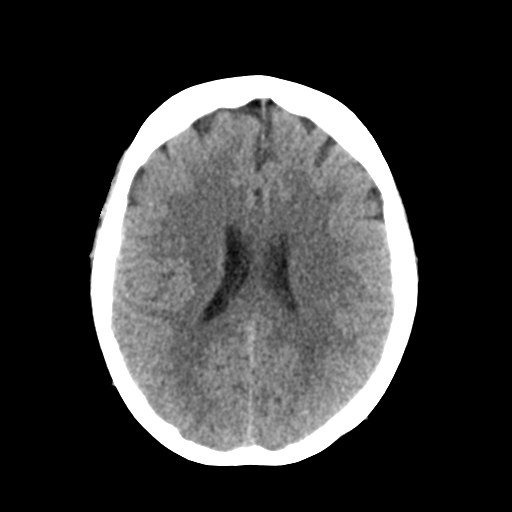
[im 19/32  brain]
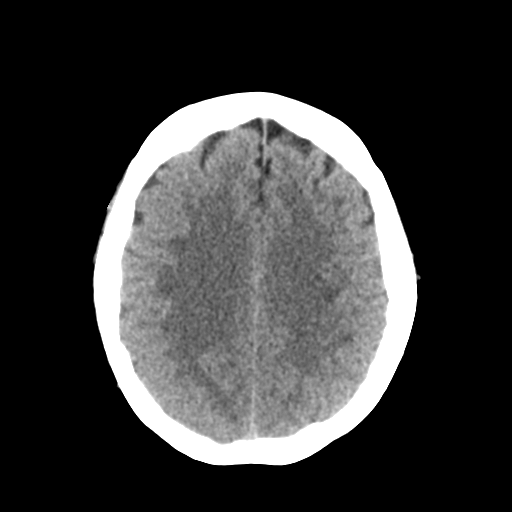
[im 22/32  brain]
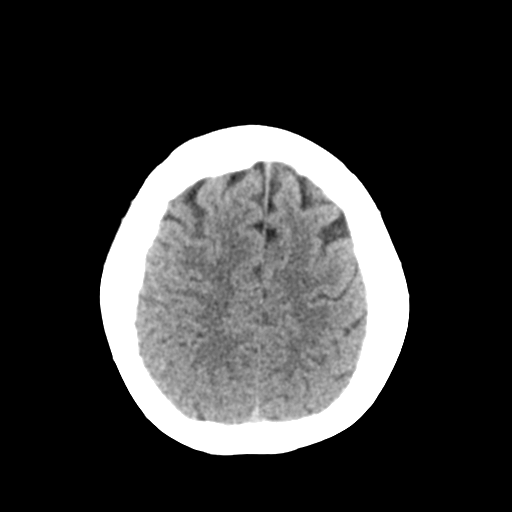
[im 24/32  brain]
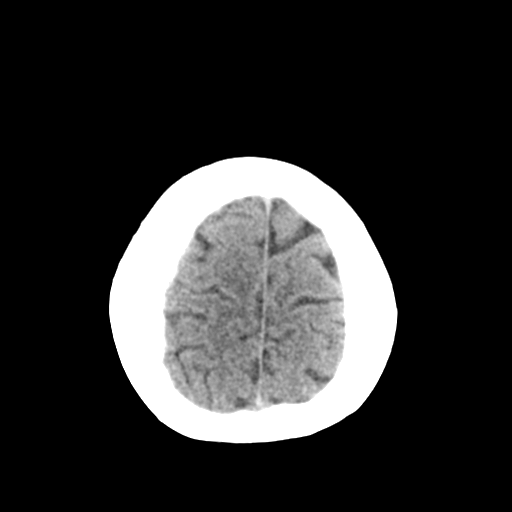
[im 24/32  bone]
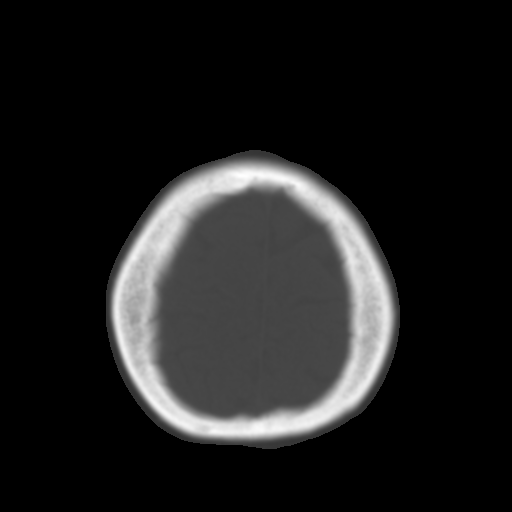
[im 27/32  brain]
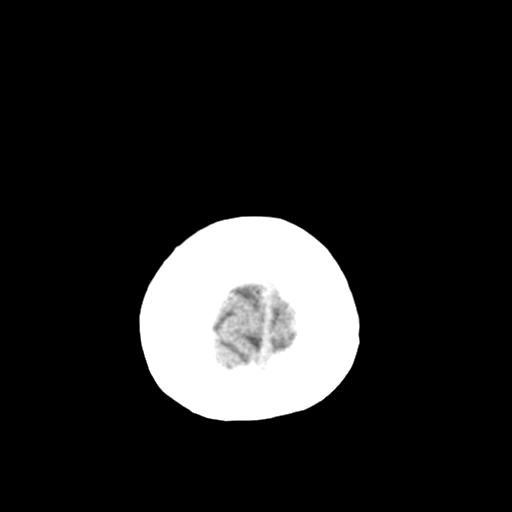
[im 29/32  brain]
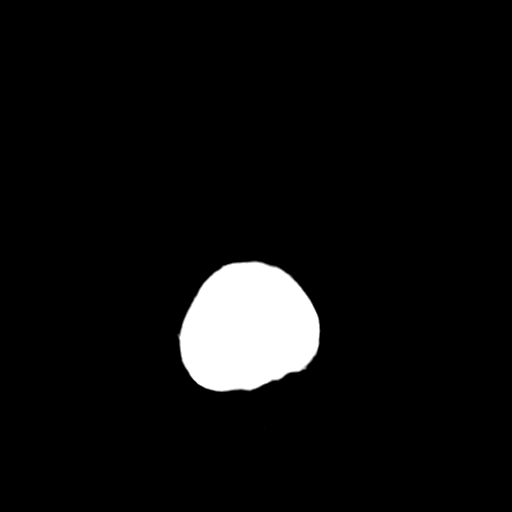

[Series 4: coronal soft tissue · coronal · 0.33mm/px · 3 of 60 slices shown]
[im 20/60  brain]
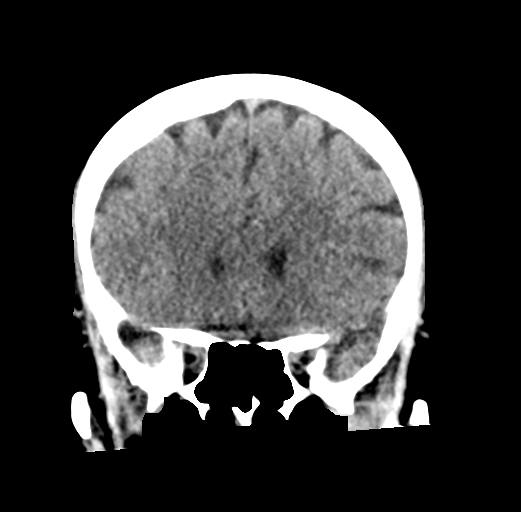
[im 27/60  brain]
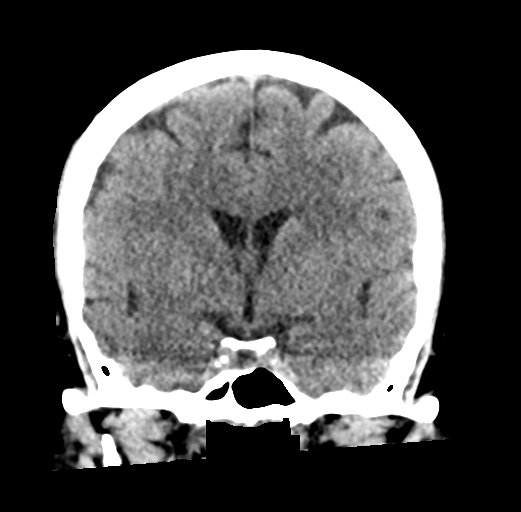
[im 33/60  brain]
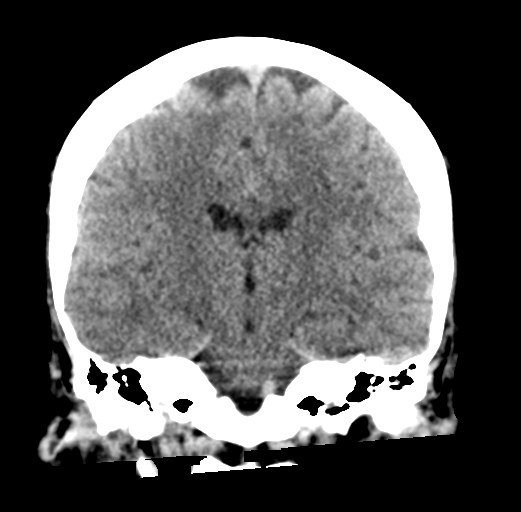

[Series 5: sagittal soft tissue · sagittal · 0.33mm/px · 3 of 57 slices shown]
[im 19/57  brain]
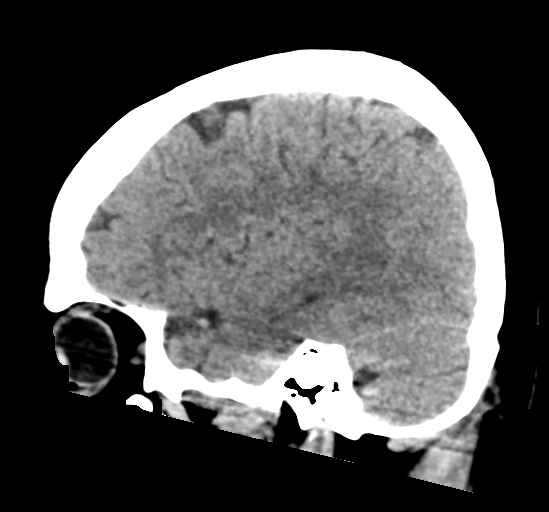
[im 29/57  brain]
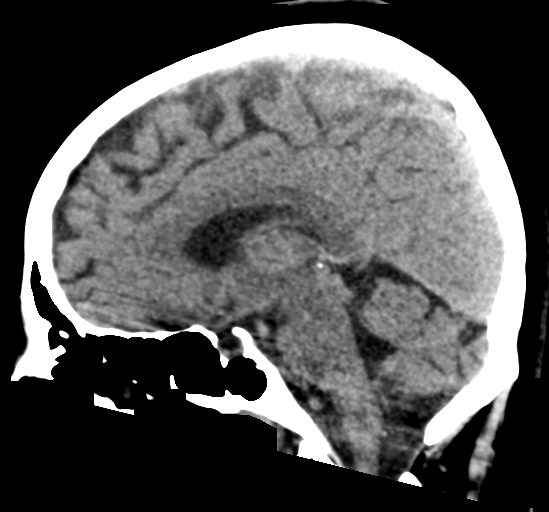
[im 38/57  brain]
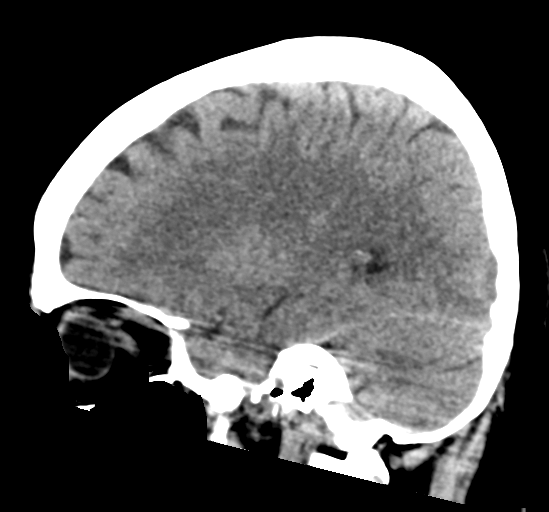

[17 of 47 positions shown; findings below may reference images not displayed]

FINDINGS: Brain: No evidence of acute infarction, hemorrhage, hydrocephalus,
extra-axial collection or mass lesion/mass effect.

Vascular: No hyperdense vessel or unexpected calcification.

Skull: Calvarium appears intact.

Sinuses/Orbits: Paranasal sinuses and mastoid air cells are clear.

Other: None.
IMPRESSION: No acute intracranial abnormalities.

## 2020-02-23 LAB — NOVEL CORONAVIRUS, NAA

## 2020-03-13 ENCOUNTER — Other Ambulatory Visit: Payer: Self-pay

## 2020-03-13 ENCOUNTER — Emergency Department (HOSPITAL_COMMUNITY)
Admission: EM | Admit: 2020-03-13 | Discharge: 2020-03-14 | Disposition: A | Discharge status: Home / Self Care | Payer: Medicare Other | Attending: Emergency Medicine | Admitting: Emergency Medicine

## 2020-03-13 ENCOUNTER — Encounter (HOSPITAL_COMMUNITY): Payer: Self-pay | Admitting: *Deleted

## 2020-03-13 DIAGNOSIS — E1122 Type 2 diabetes mellitus with diabetic chronic kidney disease: Secondary | ICD-10-CM | POA: Diagnosis not present

## 2020-03-13 DIAGNOSIS — J449 Chronic obstructive pulmonary disease, unspecified: Secondary | ICD-10-CM | POA: Insufficient documentation

## 2020-03-13 DIAGNOSIS — N189 Chronic kidney disease, unspecified: Secondary | ICD-10-CM | POA: Diagnosis not present

## 2020-03-13 DIAGNOSIS — G4489 Other headache syndrome: Secondary | ICD-10-CM | POA: Insufficient documentation

## 2020-03-13 DIAGNOSIS — R059 Cough, unspecified: Secondary | ICD-10-CM | POA: Diagnosis not present

## 2020-03-13 DIAGNOSIS — R6883 Chills (without fever): Secondary | ICD-10-CM | POA: Insufficient documentation

## 2020-03-13 DIAGNOSIS — Z7951 Long term (current) use of inhaled steroids: Secondary | ICD-10-CM | POA: Insufficient documentation

## 2020-03-13 DIAGNOSIS — F17210 Nicotine dependence, cigarettes, uncomplicated: Secondary | ICD-10-CM | POA: Insufficient documentation

## 2020-03-13 DIAGNOSIS — J45909 Unspecified asthma, uncomplicated: Secondary | ICD-10-CM | POA: Diagnosis not present

## 2020-03-13 DIAGNOSIS — Z7984 Long term (current) use of oral hypoglycemic drugs: Secondary | ICD-10-CM | POA: Diagnosis not present

## 2020-03-13 DIAGNOSIS — I129 Hypertensive chronic kidney disease with stage 1 through stage 4 chronic kidney disease, or unspecified chronic kidney disease: Secondary | ICD-10-CM | POA: Diagnosis not present

## 2020-03-13 DIAGNOSIS — Z8673 Personal history of transient ischemic attack (TIA), and cerebral infarction without residual deficits: Secondary | ICD-10-CM | POA: Insufficient documentation

## 2020-03-13 DIAGNOSIS — Z7982 Long term (current) use of aspirin: Secondary | ICD-10-CM | POA: Diagnosis not present

## 2020-03-13 DIAGNOSIS — E1140 Type 2 diabetes mellitus with diabetic neuropathy, unspecified: Secondary | ICD-10-CM | POA: Insufficient documentation

## 2020-03-13 DIAGNOSIS — Z7902 Long term (current) use of antithrombotics/antiplatelets: Secondary | ICD-10-CM | POA: Insufficient documentation

## 2020-03-13 DIAGNOSIS — M79604 Pain in right leg: Secondary | ICD-10-CM | POA: Diagnosis not present

## 2020-03-13 DIAGNOSIS — Z20822 Contact with and (suspected) exposure to covid-19: Secondary | ICD-10-CM | POA: Diagnosis not present

## 2020-03-13 DIAGNOSIS — Z79899 Other long term (current) drug therapy: Secondary | ICD-10-CM | POA: Diagnosis not present

## 2020-03-13 MED ORDER — DIPHENHYDRAMINE HCL 50 MG/ML IJ SOLN
25.0000 mg | Freq: Once | INTRAMUSCULAR | Status: DC
  Filled 2020-03-13: qty 1

## 2020-03-13 MED ORDER — CLOPIDOGREL BISULFATE 75 MG PO TABS: 75.0000 mg | ORAL_TABLET | Freq: Every day | ORAL | 0 refills | Status: AC

## 2020-03-13 MED ORDER — METOCLOPRAMIDE HCL 5 MG/ML IJ SOLN
10.0000 mg | Freq: Once | INTRAMUSCULAR | Status: AC
  Administered 2020-03-14: 10 mg via INTRAVENOUS
  Filled 2020-03-13: qty 2

## 2020-03-13 MED ORDER — AMLODIPINE BESYLATE 10 MG PO TABS
10.0000 mg | ORAL_TABLET | Freq: Every day | ORAL | 0 refills | Status: AC
Start: 2020-03-13 — End: ?

## 2020-03-13 NOTE — ED Triage Notes (Signed)
Pt c/o headache x one day with pain in her leg that has moved up to her groin

## 2020-03-14 DIAGNOSIS — G4489 Other headache syndrome: Secondary | ICD-10-CM | POA: Diagnosis not present

## 2020-03-14 LAB — RESPIRATORY PANEL BY RT PCR (FLU A&B, COVID)
Influenza A by PCR: NEGATIVE
Influenza B by PCR: NEGATIVE
SARS Coronavirus 2 by RT PCR: NEGATIVE

## 2020-03-14 MED ORDER — FENTANYL CITRATE (PF) 100 MCG/2ML IJ SOLN
100.0000 ug | Freq: Once | INTRAMUSCULAR | Status: AC
  Administered 2020-03-14: 100 ug via INTRAVENOUS
  Filled 2020-03-14: qty 2

## 2020-03-14 MED ORDER — CLOPIDOGREL BISULFATE 75 MG PO TABS
75.0000 mg | ORAL_TABLET | Freq: Once | ORAL | Status: AC
  Administered 2020-03-14: 75 mg via ORAL
  Filled 2020-03-14: qty 1

## 2020-03-14 NOTE — ED Provider Notes (Signed)
[facility] EMERGENCY DEPARTMENT Provider Note   CSN: [id number] Arrival date & time: 03/13/20  2233     History Chief Complaint  Patient presents with  . Headache    Katrina [name] is a 59 y.o. female.  The history is provided by the patient.  Headache Pain location:  Generalized Radiates to:  Does not radiate Onset quality:  Gradual Duration:  1 day Timing:  Constant Progression:  Worsening Chronicity:  New Similar to prior headaches: yes   Relieved by:  Nothing Worsened by:  Light Associated symptoms: congestion and cough   Associated symptoms: no abdominal pain, no eye pain, no fever, no numbness, no visual change, no vomiting and no weakness       Patient with history of arthritis, COPD, headaches, stroke, diabetes presents with 2 complaints.  She reports headache for the past day.  Gradual in onset and is similar to prior headaches.  No focal weakness.  No vision changes.  No fevers.  However she has had a recent cough and chills and has felt diaphoretic. No chest pain or shortness of breath  She also reports right leg pain.  It goes from her right groin down into her right calf.  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  She has been on anticoagulation in the past.  She supposed to be on Plavix but has run out of her medicines.  No trauma. No new back pain  She reports her PCP has left the practice and she does not have follow-up for a few weeks.  She is run out of her blood pressure medicines as well as her Plavix which she thinks is triggering her headache Past Medical History:  Diagnosis Date  . Allergy   . Anemia 1975-1976   . Anxiety    takes Valium daily as needed  . Arthritis    "spine" (12/03/2016)  . Asthma    has inhalers but doesn't use (12/03/2016)  . Chronic bronchitis ([place])    "get it a couple times q yr" (12/03/2016)  . Chronic kidney disease   . Chronic lower back pain    budlging disc   . Claustrophobia   . COPD (chronic obstructive  pulmonary disease) ([place])   . Daily headache   . Depression   . Diverticulitis   . Family history of adverse reaction to anesthesia    2 daughters gets extremely sick   . GERD (gastroesophageal reflux disease)    takes Dexilant daily  . Heart murmur   . History of blood transfusion 1975-1976 "several"   no abnormal reaction noted  . History of colitis   . History of colon polyps    benign  . History of gastric ulcer   . History of hiatal hernia    "small one"  . History of MRSA infection 2017  . Hyperlipidemia    was on meds but has been off over a yr  . Hypertension    takes Amlodipine and Maxzide daily  . Insomnia    takes Ambien nightly  . Iron deficiency anemia    "when I was young"  . Lung nodules   . Migraine    "2-3/wk" (12/03/2016)  . MS (multiple sclerosis) ([place])    questionable per pt  . Noncompliance   . Osteoporosis   . Peripheral neuropathy    weakness,numbness,and tingling. Takes Gabapentin daily  . PTSD (post-traumatic stress disorder) dx'd 2016/2017   "was dx'd w/bipolar in 1991; replaced w/PTSD dx 2016/2017" (12/03/2016)  . Restless  leg syndrome    takes Requip daily  . 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 Type II diabetes mellitus ([place])    "went off insulin in 2012/2013" (12/03/2016)    Patient Active Problem List   Diagnosis Date Noted  . Headache 06/12/2019  . Lumbar radiculopathy 05/10/2019  . Chronic radicular lumbar pain 05/10/2019  . History of lumbar fusion  (L5-S1 2005) 05/10/2019  . Neuroforaminal stenosis of lumbar spine 05/10/2019  . Lumbar degenerative disc disease 05/10/2019  . 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 Impaired insight 02/08/2018  . Adjustment disorder with disturbance of emotion 02/08/2018  . Left lower quadrant abdominal pain affecting pregnancy 12/21/2017  . Chest pain in adult 08/20/2017  . Abnormal brain MRI  08/08/2017  . Weakness of left leg 05/19/2017  . Hypertension 12/03/2016  . Adverse food reaction 10/14/2016  . Pulmonary nodules 10/14/2016  . Non-seasonal allergic rhinitis due to fungal spores 10/14/2016  . Cigarette smoker 09/24/2016  . Cerebral microvascular disease 08/19/2016  . Non compliance with medical treatment 05/11/2016  . Leg pain, anterior, right 05/11/2016  . Syncope 05/02/2016  . Type 2 diabetes mellitus with diabetic neuropathy ([place]) 05/02/2016  . Restless leg syndrome 05/02/2016  . History of CVA with residual deficit   . Vascular headache   . History of chest pain   . Generalized anxiety disorder   . Chronic bilateral low back pain without sciatica   . Peripheral neuropathy   . Migraine without status migrainosus, not intractable   . PTSD (post-traumatic stress disorder)   . History of cerebrovascular accident (CVA) with residual deficit   . CVA (cerebral vascular accident) ([place]) 04/03/2016  . Stroke (cerebrum) ([place]) 04/03/2016  . Post traumatic stress disorder 10/06/2015  . Precordial pain   . Migraine syndrome 01/24/2015  . Migraine 01/24/2015  . TIA (transient ischemic attack) 12/25/2014  . Hypertensive urgency   . Atypical chest pain   . Malignant hypertension   . Hypokalemia 04/16/2014  . Muscle weakness (generalized) 04/05/2014  . Stiffness of left hip joint 04/05/2014  . Pain in left hip 04/05/2014  . Diastolic dysfunction 16/03/9603  . Normal coronary arteries 03/20/2014  . PUD (peptic ulcer disease) 03/20/2014  . Type 2 diabetes, uncontrolled, with neuropathy ([place]) 03/20/2014  . Morbid obesity due to excess calories ([place]) 03/20/2014  . Left-sided weakness 03/20/2014  . Paresthesias 03/20/2014  . Cerebrovascular disease 03/20/2014  . Dyspnea on exertion 03/05/2012  . Tobacco abuse 03/05/2012  . Moderate COPD (chronic obstructive pulmonary disease) ([place]) 11/23/2011  . GERD (gastroesophageal reflux disease) 11/23/2011  . Uncontrolled  hypertension 11/23/2011    Past Surgical History:  Procedure Laterality Date  . ABDOMINAL AORTOGRAM N/A 12/03/2016   Procedure: Abdominal Aortogram;  Surgeon: Angelia Mould, MD;  Location: [place] [facility];  Service: Cardiovascular;  Laterality: N/A;  . ABDOMINAL HYSTERECTOMY  12/1987  . ADENOIDECTOMY  1975  . ANKLE SURGERY Bilateral 1993; 1995 X2   "stabilzation done; 1 on the right; 2 on the left"  . APPENDECTOMY  1989  . BREAST EXCISIONAL BIOPSY Left   . BREAST LUMPECTOMY Left    "benign tumor"  . CARDIAC CATHETERIZATION N/A 01/31/2015   Procedure: Left Heart Cath and Coronary Angiography;  Surgeon: Burnell Blanks, MD;  Location: [place] [facility];  Service: Cardiovascular;  Laterality: N/A;  . COLONOSCOPY    . DILATION AND CURETTAGE  OF UTERUS    . ESOPHAGOGASTRODUODENOSCOPY    . LAPAROSCOPIC CHOLECYSTECTOMY  1996  . NASAL RECONSTRUCTION  1976  . NASAL SINUS SURGERY  1975  . POSTERIOR LUMBAR FUSION  2005  . RADIOLOGY WITH ANESTHESIA N/A 01/15/2016   Procedure: MRI LUMBAR SPINE WITHOUT;  Surgeon: Medication Radiologist, MD;  Location: [facility];  Service: Radiology;  Laterality: N/A;  . RADIOLOGY WITH ANESTHESIA N/A 06/29/2016   Procedure: MRI OF THE BRAIN WITH AND WITHOUT;  Surgeon: Medication Radiologist, MD;  Location: [place];  Service: Radiology;  Laterality: N/A;  . RENAL ANGIOGRAPHY N/A 12/03/2016   Procedure: Renal Angiography;  Surgeon: Angelia Mould, MD;  Location: [place] [facility];  Service: Cardiovascular;  Laterality: N/A;  . TONSILLECTOMY  1992  . TUBAL LIGATION    . TUMOR EXCISION  1998   from back of skull; developed;  MRSA from the area that had to be packed     OB History   No obstetric history on file.     Family History  Problem Relation Age of Onset  . Coronary artery disease Father   . Emphysema Father   . Heart attack Father 6       Died age 70  . Stroke Father   . Cancer Father        Unsure of type   . Allergic rhinitis  Father   . Depression Mother   . Skin cancer Mother   . Bipolar disorder Brother   . Drug abuse Brother   . Leukemia Brother   . Bipolar disorder Daughter   . Allergic rhinitis Daughter   . Asthma Daughter   . Urticaria Daughter   . Diabetes Maternal Grandmother   . Stomach cancer Maternal Grandfather   . Heart attack Sister        Died in 23s  . Heart attack Brother        Died age 78  . Stomach cancer Other   . Liver disease Cousin   . Angioedema Neg Hx   . Atopy Neg Hx   . Eczema Neg Hx   . Immunodeficiency Neg Hx   . Breast cancer Neg Hx   . Colon cancer Neg Hx     Social History   Tobacco Use  . Smoking status: Current Every Day Smoker    Packs/day: 1.00    Years: 45.00    Pack years: 45.00    Types: Cigarettes    Start date: 03/04/1971  . Smokeless tobacco: Never Used  Vaping Use  . Vaping Use: Never used  Substance Use Topics  . Alcohol use: No    Alcohol/week: 0.0 standard drinks  . Drug use: No    Home Medications Prior to Admission medications   Medication Sig Start Date End Date Taking? Authorizing Provider  amLODipine (NORVASC) 10 MG tablet Take 1 tablet (10 mg total) by mouth daily. 03/13/20   Ripley Fraise, MD  aspirin-acetaminophen-caffeine (EXCEDRIN EXTRA STRENGTH) [phone] MG tablet Take 2 tablets by mouth daily as needed for headache or migraine.     [provider]  clopidogrel (PLAVIX) 75 MG tablet Take 1 tablet (75 mg total) by mouth daily. 03/13/20   Ripley Fraise, MD  dexlansoprazole (DEXILANT) 60 MG capsule Take 1 tab by mouth every morning. Patient taking differently: Take 60 mg by mouth every morning.  10/18/16   Esterwood, Amy S, PA-C  EPINEPHrine (EPIPEN 2-PAK) 0.3 mg/0.3 mL IJ SOAJ injection Inject 0.3 mg into the muscle once as needed (  for allergic reaction).     [provider]  metFORMIN (GLUCOPHAGE) 500 MG tablet Take 250 mg by mouth daily. 05/18/18   [provider]  montelukast (SINGULAIR) 10 MG  tablet Take 10 mg by mouth at bedtime.    [provider]  rizatriptan (MAXALT) 10 MG tablet  06/18/19   [provider]  rOPINIRole (REQUIP) 4 MG tablet Take 4 mg by mouth at bedtime.    [provider]  rosuvastatin (CRESTOR) 5 MG tablet Take 5 mg by mouth every morning.     [provider]  tiZANidine (ZANAFLEX) 4 MG tablet Take 0.5-1 tablets (2-4 mg total) by mouth at bedtime as needed for muscle spasms. 05/10/19 05/09/20  Gillis Santa, MD  zolpidem (AMBIEN CR) 12.5 MG CR tablet Take 12.5 mg by mouth at bedtime. 05/16/17   [provider]  hydrALAZINE (APRESOLINE) 100 MG tablet Take 1 tablet (100 mg total) by mouth 2 (two) times daily. 05/11/19 03/13/20  Herminio Commons, MD  labetalol (NORMODYNE) 200 MG tablet Take 1 tablet (200 mg total) by mouth 2 (two) times daily. 05/11/19 03/13/20  Herminio Commons, MD    Allergies    Cymbalta [duloxetine hcl], Other, Penicillins, Zithromax [azithromycin], Aspirin, Pineapple, Strawberry extract, Spironolactone, Aspartame and phenylalanine, Mushroom extract complex, and Nicardipine  Review of Systems   Review of Systems  Constitutional: Positive for chills and diaphoresis. Negative for fever.  HENT: Positive for congestion.   Eyes: Negative for pain.  Respiratory: Positive for cough.   Cardiovascular: Negative for chest pain.  Gastrointestinal: Negative for abdominal pain and vomiting.  Musculoskeletal: Positive for arthralgias.  Neurological: Positive for headaches. Negative for weakness and numbness.  All other systems reviewed and are negative.   Physical Exam Updated Vital Signs BP (!) 183/88 (BP Location: Right Arm)   Pulse 90   Temp 98 F (36.7 C)   Resp 20   Ht 1.702 m (5\' 7" )   Wt 99.8 kg   SpO2 99%   BMI 34.46 kg/m   Physical Exam CONSTITUTIONAL: Well developed/well nourished HEAD: Normocephalic/atraumatic EYES: EOMI/PERRL, no nystagmus, no ptosis, no visual field  deficit ENMT: Mucous membranes moist NECK: supple no meningeal signs SPINE/BACK:entire spine nontender CV: S1/S2 noted, no murmurs/rubs/gallops noted LUNGS: Lungs are clear to auscultation bilaterally, no apparent distress ABDOMEN: soft, nontender, no rebound or guarding GU:no cva tenderness NEURO:Awake/alert, face symmetric, no arm or leg drift is noted Equal 5/5 strength with shoulder abduction, elbow flex/extension, wrist flex/extension in upper extremities and equal hand grips bilaterally Equal 5/5 strength with hip flexion,knee flex/extension, foot dorsi/plantar flexion Cranial nerves 3/4/5/6/12/13/08/11/12 tested and intact No past pointing Sensation to light touch intact in all extremities EXTREMITIES: pulses normal, full ROM, diffuse tenderness to right thigh.  There is tenderness into her right inguinal region.  No erythema or crepitus.  No induration.  No signs of cellulitis.  No deformities.  Full range of motion of the right hip.  Distal pulses equal and intact.  No significant edema in the right lower extremity.  Nurse Cinda Quest present for exam SKIN: warm, color normal PSYCH: no abnormalities of mood noted, alert and oriented to situation   ED Results / Procedures / Treatments   Labs (all labs ordered are listed, but only abnormal results are displayed) Labs Reviewed  RESPIRATORY PANEL BY RT PCR (FLU A&B, COVID)    EKG None  Radiology No results found.  Procedures Procedures    Medications Ordered in ED Medications  diphenhydrAMINE (BENADRYL) injection  25 mg (25 mg Intravenous Refused 03/14/20 0028)  metoCLOPramide (REGLAN) injection 10 mg (10 mg Intravenous Given 03/14/20 0026)  clopidogrel (PLAVIX) tablet 75 mg (75 mg Oral Given 03/14/20 0135)  fentaNYL (SUBLIMAZE) injection 100 mcg (100 mcg Intravenous Given 03/14/20 0136)    ED Course  I have reviewed the triage vital signs and the nursing notes.  Pertinent labs   results that were available during my care of  the patient were reviewed by me and considered in my medical decision making (see chart for details).    MDM Rules/Calculators/A&P                          1:16 AM For her headache, she reports this is similar to prior.  No neuro deficits.  Suspect this was triggered by her uncontrolled blood pressure.  Will treat headache and reassess.  Low suspicion for acute neurologic emergency.  Will prescribe her Norvasc and Plavix for 2 weeks until she can see her new PCP  Patient also reports cough congestion and chills.  Will send COVID-19 testing but she is vaccinated  For her right leg pain, DVT is a possibility.  She will need next day testing for DVT study.  The ultrasound has been ordered  At time of discharge, patient felt improved, headache resolved. Patient elected to restart her Plavix here and no further anticoagulation pending DVT ultrasound 2-week prescriptions for clopidogrel and Norvasc have been sent. She will follow-up as outpatient for DVT study   Katrina [name] was evaluated in Emergency Department on 03/14/2020 for the symptoms described in the history of present illness. She was evaluated in the context of the global COVID-19 pandemic, which necessitated consideration that the patient might be at risk for infection with the SARS-CoV-2 virus that causes COVID-19. Institutional protocols and algorithms that pertain to the evaluation of patients at risk for COVID-19 are in a state of rapid change based on information released by regulatory bodies including the CDC and federal and state organizations. These policies and algorithms were followed during the patient's care in the ED.  Final Clinical Impression(s) / ED Diagnoses Final diagnoses:  Other headache syndrome  Right leg pain    Rx / DC Orders ED Discharge Orders         Ordered    US Venous Img Lower Unilateral Right        03/13/20 2349    amLODipine (NORVASC) 10 MG tablet  Daily        03/13/20 2351    clopidogrel  (PLAVIX) 75 MG tablet  Daily        03/13/20 2351           Ripley Fraise, MD 03/14/20 0400

## 2020-03-14 NOTE — Discharge Instructions (Addendum)
Your COVID-19 test was negative  You are having a headache. No specific cause was found today for your headache. It may have been a migraine or other cause of headache. Stress, anxiety, fatigue, and depression are common triggers for headaches. Your headache today does not appear to be life-threatening or require hospitalization, but often the exact cause of headaches is not determined in the emergency department. Therefore, follow-up with your doctor is very important to find out what may have caused your headache, and whether or not you need any further diagnostic testing or treatment. Sometimes headaches can appear benign (not harmful), but then more serious symptoms can develop which should prompt an immediate re-evaluation by your doctor or the emergency department.  SEEK MEDICAL ATTENTION IF:  You develop possible problems with medications prescribed.  The medications don't resolve your headache, if it recurs , or if you have multiple episodes of vomiting or can't take fluids. You have a change from the usual headache.  RETURN IMMEDIATELY IF you develop a sudden, severe headache or confusion, become poorly responsive or faint, develop a fever above 100.48F or problem breathing, have a change in speech, vision, swallowing, or understanding, or develop new weakness, numbness, tingling, incoordination, or have a seizure.

## 2020-04-06 IMAGING — DX DG TIBIA/FIBULA 2V*R*
2 series · 2 of 2 positions shown · non-contrast
Comparison: Right knee and ankle radiographs dated 06/13/2015.

CLINICAL DATA: Right anterior lower leg pain for the past 9 days,
with no known injury at that time. The patient fell on concrete
yesterday with increased pain in the lower leg.

EXAM:
RIGHT TIBIA AND FIBULA - 2 VIEW

[tibia ap]
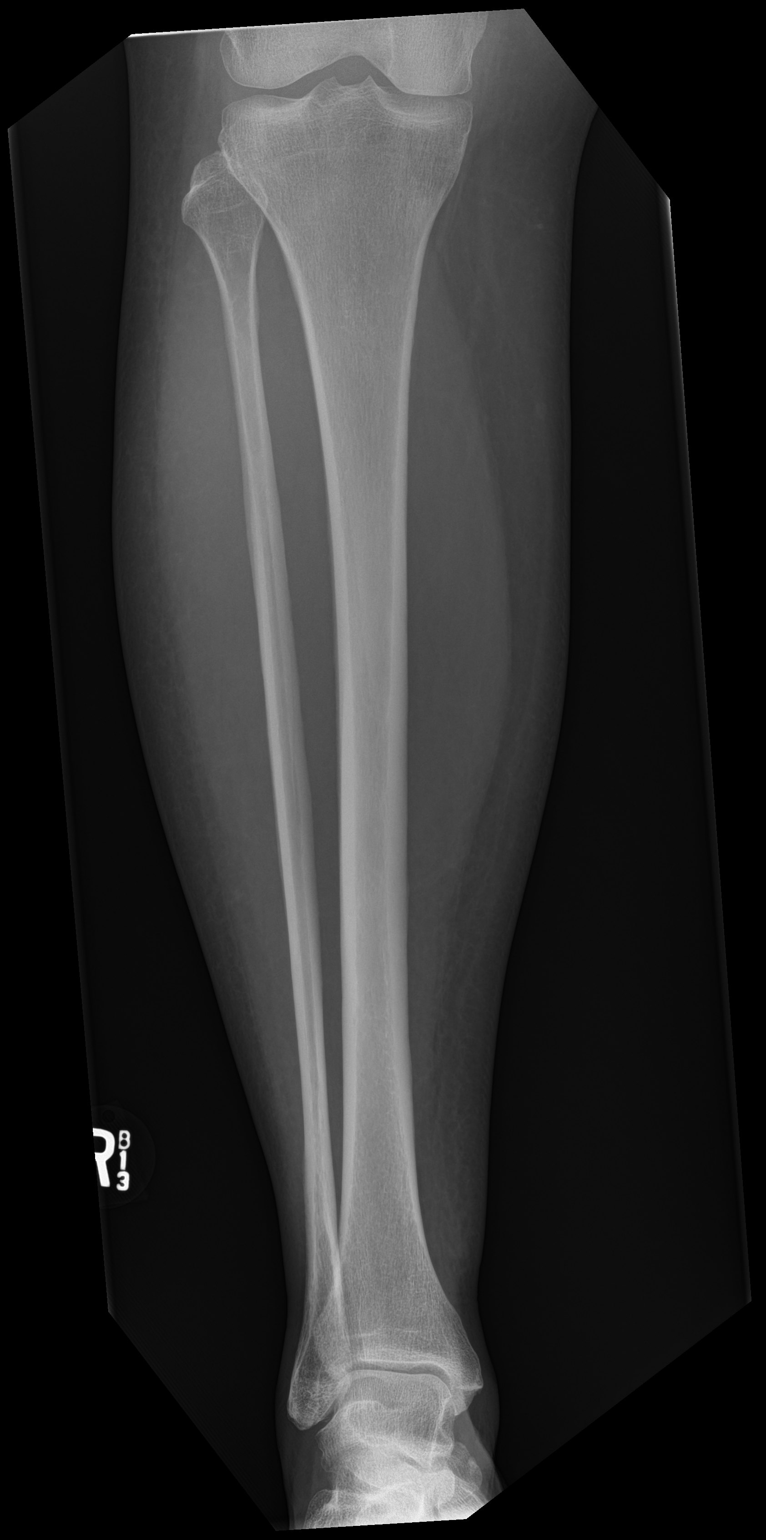

[tibia lat]
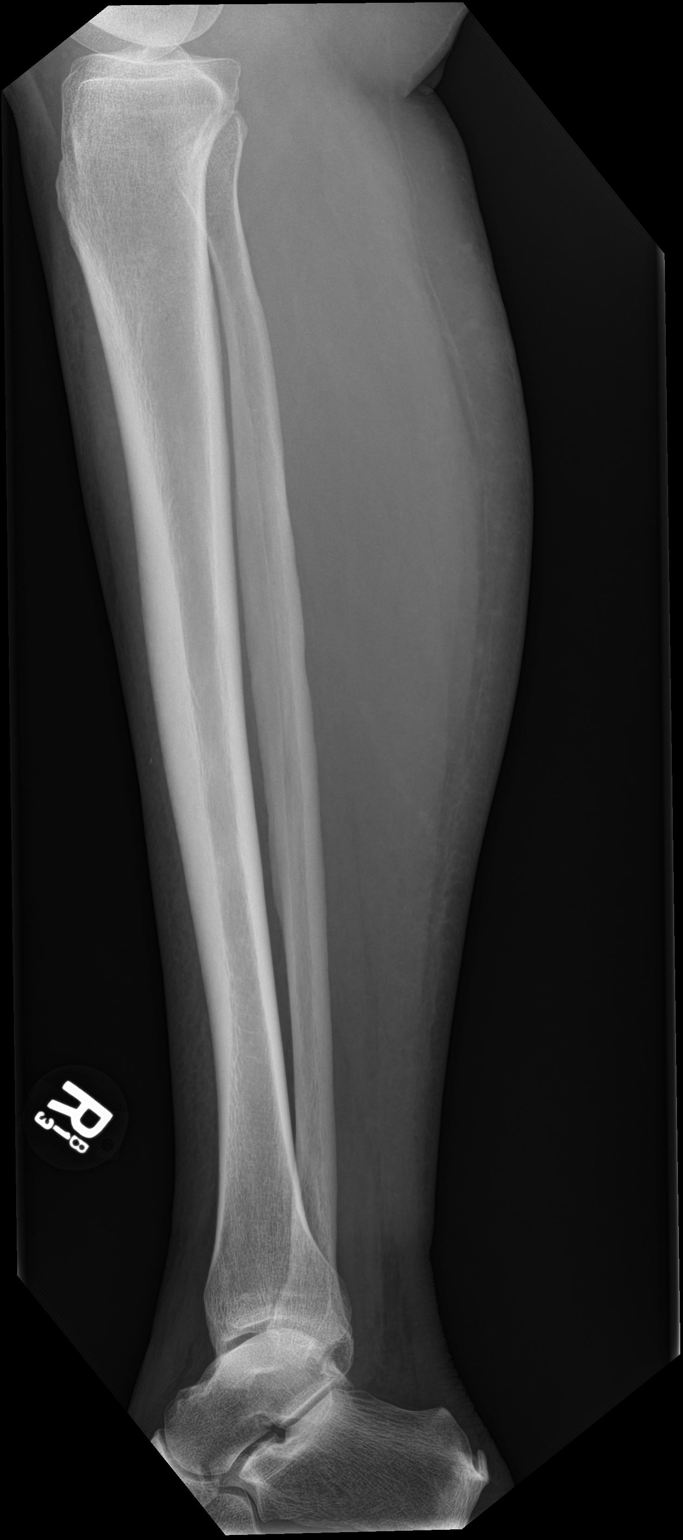

[2 of 2 positions shown; findings below may reference images not displayed]

FINDINGS: Normal appearing tibia and fibula and lower leg soft tissues.
Moderate posterior calcaneal spur formation.
IMPRESSION: No acute abnormality.

## 2020-07-12 ENCOUNTER — Emergency Department (HOSPITAL_COMMUNITY): Payer: Medicare Other

## 2020-07-12 ENCOUNTER — Emergency Department (HOSPITAL_COMMUNITY)
Admission: EM | Admit: 2020-07-12 | Discharge: 2020-07-13 | Disposition: A | Discharge status: Home / Self Care | Payer: Medicare Other | Attending: Emergency Medicine | Admitting: Emergency Medicine

## 2020-07-12 ENCOUNTER — Encounter (HOSPITAL_COMMUNITY): Payer: Self-pay

## 2020-07-12 ENCOUNTER — Other Ambulatory Visit: Payer: Self-pay

## 2020-07-12 DIAGNOSIS — R112 Nausea with vomiting, unspecified: Secondary | ICD-10-CM | POA: Diagnosis not present

## 2020-07-12 DIAGNOSIS — R2241 Localized swelling, mass and lump, right lower limb: Secondary | ICD-10-CM | POA: Diagnosis not present

## 2020-07-12 DIAGNOSIS — Z8673 Personal history of transient ischemic attack (TIA), and cerebral infarction without residual deficits: Secondary | ICD-10-CM | POA: Diagnosis not present

## 2020-07-12 DIAGNOSIS — Z7984 Long term (current) use of oral hypoglycemic drugs: Secondary | ICD-10-CM | POA: Diagnosis not present

## 2020-07-12 DIAGNOSIS — Z79899 Other long term (current) drug therapy: Secondary | ICD-10-CM | POA: Diagnosis not present

## 2020-07-12 DIAGNOSIS — J449 Chronic obstructive pulmonary disease, unspecified: Secondary | ICD-10-CM | POA: Diagnosis not present

## 2020-07-12 DIAGNOSIS — F17210 Nicotine dependence, cigarettes, uncomplicated: Secondary | ICD-10-CM | POA: Insufficient documentation

## 2020-07-12 DIAGNOSIS — Z794 Long term (current) use of insulin: Secondary | ICD-10-CM | POA: Insufficient documentation

## 2020-07-12 DIAGNOSIS — Z20822 Contact with and (suspected) exposure to covid-19: Secondary | ICD-10-CM | POA: Insufficient documentation

## 2020-07-12 DIAGNOSIS — N189 Chronic kidney disease, unspecified: Secondary | ICD-10-CM | POA: Diagnosis not present

## 2020-07-12 DIAGNOSIS — R197 Diarrhea, unspecified: Secondary | ICD-10-CM | POA: Insufficient documentation

## 2020-07-12 DIAGNOSIS — J45909 Unspecified asthma, uncomplicated: Secondary | ICD-10-CM | POA: Insufficient documentation

## 2020-07-12 DIAGNOSIS — E1140 Type 2 diabetes mellitus with diabetic neuropathy, unspecified: Secondary | ICD-10-CM | POA: Diagnosis not present

## 2020-07-12 DIAGNOSIS — R0602 Shortness of breath: Secondary | ICD-10-CM

## 2020-07-12 DIAGNOSIS — I129 Hypertensive chronic kidney disease with stage 1 through stage 4 chronic kidney disease, or unspecified chronic kidney disease: Secondary | ICD-10-CM | POA: Insufficient documentation

## 2020-07-12 DIAGNOSIS — R079 Chest pain, unspecified: Secondary | ICD-10-CM | POA: Diagnosis not present

## 2020-07-12 DIAGNOSIS — Z7982 Long term (current) use of aspirin: Secondary | ICD-10-CM | POA: Insufficient documentation

## 2020-07-12 DIAGNOSIS — R059 Cough, unspecified: Secondary | ICD-10-CM | POA: Insufficient documentation

## 2020-07-12 DIAGNOSIS — Z7902 Long term (current) use of antithrombotics/antiplatelets: Secondary | ICD-10-CM | POA: Insufficient documentation

## 2020-07-12 DIAGNOSIS — E1122 Type 2 diabetes mellitus with diabetic chronic kidney disease: Secondary | ICD-10-CM | POA: Insufficient documentation

## 2020-07-12 LAB — CBC WITH DIFFERENTIAL/PLATELET
Abs Immature Granulocytes: 0.03 10*3/uL (ref 0.00–0.07)
Basophils Absolute: 0.1 10*3/uL (ref 0.0–0.1)
Basophils Relative: 1 %
Eosinophils Absolute: 0.2 10*3/uL (ref 0.0–0.5)
Eosinophils Relative: 2 %
HCT: 40.3 % (ref 36.0–46.0)
Hemoglobin: 12.7 g/dL (ref 12.0–15.0)
Immature Granulocytes: 0 %
Lymphocytes Relative: 36 %
Lymphs Abs: 4.1 10*3/uL — ABNORMAL HIGH (ref 0.7–4.0)
MCH: 28.7 pg (ref 26.0–34.0)
MCHC: 31.5 g/dL (ref 30.0–36.0)
MCV: 91.2 fL (ref 80.0–100.0)
Monocytes Absolute: 0.7 10*3/uL (ref 0.1–1.0)
Monocytes Relative: 6 %
Neutro Abs: 6.1 10*3/uL (ref 1.7–7.7)
Neutrophils Relative %: 55 %
Platelets: 282 10*3/uL (ref 150–400)
RBC: 4.42 MIL/uL (ref 3.87–5.11)
RDW: 15.6 % — ABNORMAL HIGH (ref 11.5–15.5)
WBC: 11.2 10*3/uL — ABNORMAL HIGH (ref 4.0–10.5)
nRBC: 0 % (ref 0.0–0.2)

## 2020-07-12 LAB — COMPREHENSIVE METABOLIC PANEL
ALT: 13 U/L (ref 0–44)
AST: 14 U/L — ABNORMAL LOW (ref 15–41)
Albumin: 3.7 g/dL (ref 3.5–5.0)
Alkaline Phosphatase: 71 U/L (ref 38–126)
Anion gap: 7 (ref 5–15)
BUN: 16 mg/dL (ref 6–20)
CO2: 22 mmol/L (ref 22–32)
Calcium: 8.6 mg/dL — ABNORMAL LOW (ref 8.9–10.3)
Chloride: 110 mmol/L (ref 98–111)
Creatinine, Ser: 0.91 mg/dL (ref 0.44–1.00)
GFR, Estimated: >60 mL/min (ref >60)
Glucose, Bld: 111 mg/dL — ABNORMAL HIGH (ref 70–99)
Potassium: 3.6 mmol/L (ref 3.5–5.1)
Sodium: 139 mmol/L (ref 135–145)
Total Bilirubin: 0.2 mg/dL — ABNORMAL LOW (ref 0.3–1.2)
Total Protein: 7.1 g/dL (ref 6.5–8.1)

## 2020-07-12 LAB — LIPASE, BLOOD: Lipase: 29 U/L (ref 11–51)

## 2020-07-12 LAB — TROPONIN I (HIGH SENSITIVITY)
Troponin I (High Sensitivity): 4 ng/L (ref <18)
Troponin I (High Sensitivity): 4 ng/L (ref <18)

## 2020-07-12 LAB — BRAIN NATRIURETIC PEPTIDE: B Natriuretic Peptide: 47 pg/mL (ref 0.0–100.0)

## 2020-07-12 LAB — SARS CORONAVIRUS 2 BY RT PCR (HOSPITAL ORDER, PERFORMED IN ~~LOC~~ HOSPITAL LAB): SARS Coronavirus 2: NEGATIVE

## 2020-07-12 MED ORDER — PREDNISONE 50 MG PO TABS
60.0000 mg | ORAL_TABLET | Freq: Once | ORAL | Status: AC
  Administered 2020-07-13: 60 mg via ORAL
  Filled 2020-07-12: qty 1

## 2020-07-12 MED ORDER — IOHEXOL 350 MG/ML SOLN
100.0000 mL | Freq: Once | INTRAVENOUS | Status: AC | PRN
  Administered 2020-07-12: 100 mL via INTRAVENOUS

## 2020-07-12 MED ORDER — PREDNISONE 20 MG PO TABS: 40.0000 mg | ORAL_TABLET | Freq: Every day | ORAL | 0 refills | Status: AC

## 2020-07-12 MED ORDER — ALBUTEROL SULFATE HFA 108 (90 BASE) MCG/ACT IN AERS
4.0000 | INHALATION_SPRAY | Freq: Once | RESPIRATORY_TRACT | Status: AC
  Administered 2020-07-12: 4 via RESPIRATORY_TRACT
  Filled 2020-07-12: qty 6.7

## 2020-07-12 MED ORDER — ALBUTEROL SULFATE HFA 108 (90 BASE) MCG/ACT IN AERS: 2.0000 | INHALATION_SPRAY | Freq: Four times a day (QID) | RESPIRATORY_TRACT | 0 refills | Status: AC | PRN

## 2020-07-12 MED ORDER — AEROCHAMBER Z-STAT PLUS/MEDIUM MISC
1.0000 | Freq: Once | Status: AC
  Administered 2020-07-13: 1
  Filled 2020-07-12: qty 1

## 2020-07-12 MED ORDER — ONDANSETRON HCL 4 MG/2ML IJ SOLN
4.0000 mg | Freq: Once | INTRAMUSCULAR | Status: AC
  Administered 2020-07-12: 4 mg via INTRAVENOUS
  Filled 2020-07-12: qty 2

## 2020-07-12 MED ORDER — ALBUTEROL SULFATE (2.5 MG/3ML) 0.083% IN NEBU: 2.5000 mg | INHALATION_SOLUTION | Freq: Four times a day (QID) | RESPIRATORY_TRACT | 12 refills | Status: AC | PRN

## 2020-07-12 MED ORDER — ACETAMINOPHEN 325 MG PO TABS
650.0000 mg | ORAL_TABLET | Freq: Once | ORAL | Status: AC
  Administered 2020-07-13: 650 mg via ORAL
  Filled 2020-07-12: qty 2

## 2020-07-12 NOTE — ED Triage Notes (Signed)
Pt arrives via POV from home c/o worsening SOB over past few days. Pt reports not being able to have inhalers filed for 6 months and reports headache, back pain, chills and chest pain when coughing.

## 2020-07-12 NOTE — ED Provider Notes (Signed)
[facility] EMERGENCY DEPARTMENT Provider Note   CSN: [id number] Arrival date & time: 07/12/20  2027     History Chief Complaint  Patient presents with  . Shortness of Breath    Katrina [name] is a 60 y.o. female with a past medical history significant for COPD, hypertension, history of CVA, PTSD, type 2 diabetes, multiple sclerosis, hyperlipidemia who presents to the ED due to shortness of breath associated with cough, nausea, vomiting, and diarrhea x3 days.  Patient states shortness of breath is worse with exertion however, also present at rest.  Shortness of breath associated with central chest pain worse with deep inspiration and laying flat.  She admits to 5 episodes of nonbloody, nonbilious emesis and a few episodes of nonbloody diarrhea today.  Denies sick contacts or known Covid exposures.  She has received both her COVID vaccines however, not her booster shot. Admits to lower extremity edema (R>L). Per patient, she has a history of DVT/PE, but not currently on any anticoagulants; however, I am unable to find documentation in chart? Denies fever and chills. No treatment prior to arrival. Patient states she ran out of her Plavix and albuterol 6 months ago.   History obtained from patient and past medical records. No interpreter used during encounter.      Past Medical History:  Diagnosis Date  . Allergy   . Anemia 1975-1976   . Anxiety    takes Valium daily as needed  . Arthritis    "spine" (12/03/2016)  . Asthma    has inhalers but doesn't use (12/03/2016)  . Chronic bronchitis ([place])    "get it a couple times q yr" (12/03/2016)  . Chronic kidney disease   . Chronic lower back pain    budlging disc   . Claustrophobia   . COPD (chronic obstructive pulmonary disease) ([place])   . Daily headache   . Depression   . Diverticulitis   . Family history of adverse reaction to anesthesia    2 daughters gets extremely sick   . GERD (gastroesophageal reflux disease)    takes Dexilant  daily  . Heart murmur   . History of blood transfusion 1975-1976 "several"   no abnormal reaction noted  . History of colitis   . History of colon polyps    benign  . History of gastric ulcer   . History of hiatal hernia    "small one"  . History of MRSA infection 2017  . Hyperlipidemia    was on meds but has been off over a yr  . Hypertension    takes Amlodipine and Maxzide daily  . Insomnia    takes Ambien nightly  . Iron deficiency anemia    "when I was young"  . Lung nodules   . Migraine    "2-3/wk" (12/03/2016)  . MS (multiple sclerosis) ([employer])    questionable per pt  . Noncompliance   . Osteoporosis   . Peripheral neuropathy    weakness,numbness,and tingling. Takes Gabapentin daily  . PTSD (post-traumatic stress disorder) dx'd 2016/2017   "was dx'd w/bipolar in 1991; replaced w/PTSD dx 2016/2017" (12/03/2016)  . Restless leg syndrome    takes Requip daily  . 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 Type II diabetes mellitus ([place])    "went off insulin in 2012/2013" (12/03/2016)    Patient Active Problem List   Diagnosis Date Noted  . Headache 06/12/2019  .  Lumbar radiculopathy 05/10/2019  . Chronic radicular lumbar pain 05/10/2019  . History of lumbar fusion  (L5-S1 2005) 05/10/2019  . Neuroforaminal stenosis of lumbar spine 05/10/2019  . Lumbar degenerative disc disease 05/10/2019  . 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 Impaired insight 02/08/2018  . Adjustment disorder with disturbance of emotion 02/08/2018  . Left lower quadrant abdominal pain affecting pregnancy 12/21/2017  . Chest pain in adult 08/20/2017  . Abnormal brain MRI 08/08/2017  . Weakness of left leg 05/19/2017  . Hypertension 12/03/2016  . Adverse food reaction 10/14/2016  . Pulmonary nodules 10/14/2016  . Non-seasonal allergic rhinitis due to fungal spores 10/14/2016  . Cigarette smoker  09/24/2016  . Cerebral microvascular disease 08/19/2016  . Non compliance with medical treatment 05/11/2016  . Leg pain, anterior, right 05/11/2016  . Syncope 05/02/2016  . Type 2 diabetes mellitus with diabetic neuropathy ([place]) 05/02/2016  . Restless leg syndrome 05/02/2016  . History of CVA with residual deficit   . Vascular headache   . History of chest pain   . Generalized anxiety disorder   . Chronic bilateral low back pain without sciatica   . Peripheral neuropathy   . Migraine without status migrainosus, not intractable   . PTSD (post-traumatic stress disorder)   . History of cerebrovascular accident (CVA) with residual deficit   . CVA (cerebral vascular accident) ([place]) 04/03/2016  . Stroke (cerebrum) ([place]) 04/03/2016  . Post traumatic stress disorder 10/06/2015  . Precordial pain   . Migraine syndrome 01/24/2015  . Migraine 01/24/2015  . TIA (transient ischemic attack) 12/25/2014  . Hypertensive urgency   . Atypical chest pain   . Malignant hypertension   . Hypokalemia 04/16/2014  . Muscle weakness (generalized) 04/05/2014  . Stiffness of left hip joint 04/05/2014  . Pain in left hip 04/05/2014  . Diastolic dysfunction 09/32/3557  . Normal coronary arteries 03/20/2014  . PUD (peptic ulcer disease) 03/20/2014  . Type 2 diabetes, uncontrolled, with neuropathy ([place]) 03/20/2014  . Morbid obesity due to excess calories ([place]) 03/20/2014  . Left-sided weakness 03/20/2014  . Paresthesias 03/20/2014  . Cerebrovascular disease 03/20/2014  . Dyspnea on exertion 03/05/2012  . Tobacco abuse 03/05/2012  . Moderate COPD (chronic obstructive pulmonary disease) ([place]) 11/23/2011  . GERD (gastroesophageal reflux disease) 11/23/2011  . Uncontrolled hypertension 11/23/2011    Past Surgical History:  Procedure Laterality Date  . ABDOMINAL AORTOGRAM N/A 12/03/2016   Procedure: Abdominal Aortogram;  Surgeon: Angelia Mould, MD;  Location: [place] [facility];  Service:  Cardiovascular;  Laterality: N/A;  . ABDOMINAL HYSTERECTOMY  12/1987  . ADENOIDECTOMY  1975  . ANKLE SURGERY Bilateral 1993; 1995 X2   "stabilzation done; 1 on the right; 2 on the left"  . APPENDECTOMY  1989  . BREAST EXCISIONAL BIOPSY Left   . BREAST LUMPECTOMY Left    "benign tumor"  . CARDIAC CATHETERIZATION N/A 01/31/2015   Procedure: Left Heart Cath and Coronary Angiography;  Surgeon: Burnell Blanks, MD;  Location: [place] [facility];  Service: Cardiovascular;  Laterality: N/A;  . COLONOSCOPY    . DILATION AND CURETTAGE OF UTERUS    . ESOPHAGOGASTRODUODENOSCOPY    . LAPAROSCOPIC CHOLECYSTECTOMY  1996  . NASAL RECONSTRUCTION  1976  . NASAL SINUS SURGERY  1975  . POSTERIOR LUMBAR FUSION  2005  . RADIOLOGY WITH ANESTHESIA N/A 01/15/2016   Procedure: MRI LUMBAR SPINE WITHOUT;  Surgeon: Medication Radiologist, MD;  Location: [place];  Service: Radiology;  Laterality: N/A;  .  RADIOLOGY WITH ANESTHESIA N/A 06/29/2016   Procedure: MRI OF THE BRAIN WITH AND WITHOUT;  Surgeon: Medication Radiologist, MD;  Location: [place];  Service: Radiology;  Laterality: N/A;  . RENAL ANGIOGRAPHY N/A 12/03/2016   Procedure: Renal Angiography;  Surgeon: Angelia Mould, MD;  Location: [place] [facility];  Service: Cardiovascular;  Laterality: N/A;  . TONSILLECTOMY  1992  . TUBAL LIGATION    . TUMOR EXCISION  1998   from back of skull; developed;  MRSA from the area that had to be packed     OB History   No obstetric history on file.     Family History  Problem Relation Age of Onset  . Coronary artery disease Father   . Emphysema Father   . Heart attack Father 41       Died age 89  . Stroke Father   . Cancer Father        Unsure of type   . Allergic rhinitis Father   . Depression Mother   . Skin cancer Mother   . Bipolar disorder Brother   . Drug abuse Brother   . Leukemia Brother   . Bipolar disorder Daughter   . Allergic rhinitis Daughter   . Asthma Daughter   . Urticaria  Daughter   . Diabetes Maternal Grandmother   . Stomach cancer Maternal Grandfather   . Heart attack Sister        Died in 11s  . Heart attack Brother        Died age 52  . Stomach cancer Other   . Liver disease Cousin   . Angioedema Neg Hx   . Atopy Neg Hx   . Eczema Neg Hx   . Immunodeficiency Neg Hx   . Breast cancer Neg Hx   . Colon cancer Neg Hx     Social History   Tobacco Use  . Smoking status: Current Every Day Smoker    Packs/day: 1.00    Years: 45.00    Pack years: 45.00    Types: Cigarettes    Start date: 03/04/1971  . Smokeless tobacco: Never Used  Vaping Use  . Vaping Use: Never used  Substance Use Topics  . Alcohol use: No    Alcohol/week: 0.0 standard drinks  . Drug use: No    Home Medications Prior to Admission medications   Medication Sig Start Date End Date Taking? Authorizing Provider  albuterol (VENTOLIN HFA) 108 (90 Base) MCG/ACT inhaler Inhale 2 puffs into the lungs every 6 (six) hours as needed for wheezing or shortness of breath. 07/12/20  Yes Hideo Googe, Druscilla Brownie, PA-C  predniSONE (DELTASONE) 20 MG tablet Take 2 tablets (40 mg total) by mouth daily for 5 days. 07/12/20 07/17/20 Yes Ahmad Vanwey, Druscilla Brownie, PA-C  amLODipine (NORVASC) 10 MG tablet Take 1 tablet (10 mg total) by mouth daily. 03/13/20   Ripley Fraise, MD  aspirin-acetaminophen-caffeine (EXCEDRIN EXTRA STRENGTH) [phone] MG tablet Take 2 tablets by mouth daily as needed for headache or migraine.     [provider]  clopidogrel (PLAVIX) 75 MG tablet Take 1 tablet (75 mg total) by mouth daily. 03/13/20   Ripley Fraise, MD  dexlansoprazole (DEXILANT) 60 MG capsule Take 1 tab by mouth every morning. Patient taking differently: Take 60 mg by mouth every morning.  10/18/16   Esterwood, Amy S, PA-C  EPINEPHrine (EPIPEN 2-PAK) 0.3 mg/0.3 mL IJ SOAJ injection Inject 0.3 mg into the muscle once as needed (for allergic reaction).  [provider]  metFORMIN (GLUCOPHAGE) 500 MG  tablet Take 250 mg by mouth daily. 05/18/18   [provider]  montelukast (SINGULAIR) 10 MG tablet Take 10 mg by mouth at bedtime.    [provider]  rizatriptan (MAXALT) 10 MG tablet  06/18/19   [provider]  rOPINIRole (REQUIP) 4 MG tablet Take 4 mg by mouth at bedtime.    [provider]  rosuvastatin (CRESTOR) 5 MG tablet Take 5 mg by mouth every morning.     [provider]  zolpidem (AMBIEN CR) 12.5 MG CR tablet Take 12.5 mg by mouth at bedtime. 05/16/17   [provider]  hydrALAZINE (APRESOLINE) 100 MG tablet Take 1 tablet (100 mg total) by mouth 2 (two) times daily. 05/11/19 03/13/20  Laqueta Linden, MD  labetalol (NORMODYNE) 200 MG tablet Take 1 tablet (200 mg total) by mouth 2 (two) times daily. 05/11/19 03/13/20  Laqueta Linden, MD    Allergies    Cymbalta [duloxetine hcl], Other, Penicillins, Zithromax [azithromycin], Aspirin, Pineapple, Strawberry extract, Spironolactone, Aspartame and phenylalanine, Mushroom extract complex, and Nicardipine  Review of Systems   Review of Systems  Constitutional: Negative for chills and fever.  Respiratory: Positive for cough and shortness of breath.   Cardiovascular: Positive for chest pain and leg swelling.  Gastrointestinal: Positive for diarrhea, nausea and vomiting. Negative for abdominal pain.  Genitourinary: Negative for dysuria.  Neurological: Positive for headaches.  All other systems reviewed and are negative.   Physical Exam Updated Vital Signs BP (!) 174/62   Pulse 82   Temp 98.9 F (37.2 C) (Oral)   Resp (!) 23   Ht 5\' 7"  (1.702 m)   Wt 104.3 kg   SpO2 96%   BMI 36.02 kg/m   Physical Exam Vitals and nursing note reviewed.  Constitutional:      General: She is not in acute distress.    Appearance: She is ill-appearing.  HENT:     Head: Normocephalic.  Eyes:     Pupils: Pupils are equal, round, and reactive to light.  Cardiovascular:     Rate and  Rhythm: Normal rate and regular rhythm.     Pulses: Normal pulses.     Heart sounds: Normal heart sounds. No murmur heard. No friction rub. No gallop.   Pulmonary:     Effort: Pulmonary effort is normal.     Breath sounds: Normal breath sounds.     Comments: Speaking in broken up sentences. Tachypneic Abdominal:     General: Abdomen is flat. Bowel sounds are normal. There is no distension.     Palpations: Abdomen is soft.     Tenderness: There is no abdominal tenderness. There is no guarding or rebound.  Musculoskeletal:     Cervical back: Neck supple.     Comments: Mild lower extremity edema around right ankle. No calf tenderness. Negative homan sign bilaterally  Skin:    General: Skin is warm and dry.  Neurological:     General: No focal deficit present.     Mental Status: She is alert.  Psychiatric:        Mood and Affect: Mood normal.        Behavior: Behavior normal.     ED Results / Procedures / Treatments   Labs (all labs ordered are listed, but only abnormal results are displayed) Labs Reviewed  CBC WITH DIFFERENTIAL/PLATELET - Abnormal; Notable for the following components:      Result Value   WBC  11.2 (*)    RDW 15.6 (*)    Lymphs Abs 4.1 (*)    All other components within normal limits  COMPREHENSIVE METABOLIC PANEL - Abnormal; Notable for the following components:   Glucose, Bld 111 (*)    Calcium 8.6 (*)    AST 14 (*)    Total Bilirubin 0.2 (*)    All other components within normal limits  SARS CORONAVIRUS 2 BY RT PCR (HOSPITAL ORDER, [place] [facility])  LIPASE, BLOOD  BRAIN NATRIURETIC PEPTIDE  TROPONIN I (HIGH SENSITIVITY)  TROPONIN I (HIGH SENSITIVITY)    EKG None  Radiology CT Angio Chest PE W and/or Wo Contrast  Result Date: 07/12/2020 CLINICAL DATA:  Shortness of breath EXAM: CT ANGIOGRAPHY CHEST WITH CONTRAST TECHNIQUE: Multidetector CT imaging of the chest was performed using the standard protocol during bolus  administration of intravenous contrast. Multiplanar CT image reconstructions and MIPs were obtained to evaluate the vascular anatomy. CONTRAST:  146mL OMNIPAQUE IOHEXOL 350 MG/ML SOLN COMPARISON:  03/22/2018 FINDINGS: Cardiovascular: No filling defects in the pulmonary arteries to suggest pulmonary emboli. Heart is normal size. Aorta is normal caliber. Scattered aortic atherosclerosis. Mediastinum/Nodes: No mediastinal, hilar, or axillary adenopathy. Trachea and esophagus are unremarkable. Thyroid unremarkable. Lungs/Pleura: No confluent opacities or effusions. Upper Abdomen: Imaging into the upper abdomen demonstrates no acute findings. Musculoskeletal: Chest wall soft tissues are unremarkable. No acute bony abnormality. Review of the MIP images confirms the above findings. IMPRESSION: No evidence of pulmonary embolus. No acute cardiopulmonary disease. Aortic Atherosclerosis (ICD10-I70.0). Electronically Signed   By: Rolm Baptise M.D.   On: 07/12/2020 22:58   DG Chest Portable 1 View  Result Date: 07/12/2020 CLINICAL DATA:  Shortness of breath EXAM: PORTABLE CHEST 1 VIEW COMPARISON:  02/20/2020 FINDINGS: Heart is normal size. Lingular scarring. Patchy opacities in the upper lobes, right greater than left. No effusions. No acute bony abnormality. IMPRESSION: Patchy bilateral upper lobe airspace opacities could reflect pneumonia. Electronically Signed   By: Rolm Baptise M.D.   On: 07/12/2020 21:37    Procedures Procedures   Medications Ordered in ED Medications  albuterol (VENTOLIN HFA) 108 (90 Base) MCG/ACT inhaler 4 puff (has no administration in time range)  AeroChamber Plus Flo-Vu Medium MISC 1 each (has no administration in time range)  acetaminophen (TYLENOL) tablet 650 mg (has no administration in time range)  predniSONE (DELTASONE) tablet 60 mg (has no administration in time range)  ondansetron (ZOFRAN) injection 4 mg (4 mg Intravenous Given 07/12/20 2206)  iohexol (OMNIPAQUE) 350 MG/ML  injection 100 mL (100 mLs Intravenous Contrast Given 07/12/20 2243)    ED Course  I have reviewed the triage vital signs and the nursing notes.  Pertinent labs & imaging results that were available during my care of the patient were reviewed by me and considered in my medical decision making (see chart for details).  Clinical Course as of 07/12/20 2313  Sat Jul 12, 2020  2247 SARS Coronavirus 2: NEGATIVE [CA]    Clinical Course User Index [CA] Suzy Bouchard, PA-C   MDM Rules/Calculators/A&P                          60 year old female presents to the ED due to shortness of breath associated with chest pain, nausea, vomiting, diarrhea, and cough x3 day. Denies sick contacts or known Covid exposures. She has received both her Covid vaccines however, not her booster shot. Previous history of DVT/PE per patient but  not currently on any anticoagulants? Upon arrival, patient afebrile, not tachycardic or hypoxic. Patient tachypneic and speaking in broken up sentences. Lungs with slight decreased air movement. Mild lower extremity edema surrounding right ankle with no calf tenderness. COVID test ordered to rule out infection. Routine labs to rule out electrolyte abnormalities due to emesis and diarrhea. Troponin and EKG to rule out cardiac etiology due to chest pain. CXR to rule our pneumonia. CTA chest given history of PE/DVT with lower extremity edema and not currently on any anticoagulants.   CBC significant for mild leukocytosis at 11.2, but otherwise reassuring.  CMP significant for mild hyperglycemia at 111 with no anion gap.  Normal renal function.  No major electrolyte derangements.  BNP normal.  Doubt congestive heart failure exacerbation.  Initial troponin normal at 4.  Will obtain delta troponin to rule out ACS.  Lipase normal at 29.  Doubt pancreatitis.  Chest x-ray personally reviewed which demonstrates:   IMPRESSION:  Patchy bilateral upper lobe airspace opacities could reflect   pneumonia.   EKG personally reviewed which demonstrates normal sinus rhythm with no signs of acute ischemia.  Patient ambulated here in the ED and maintained O2 saturation above 96%. CTA personally reviewed which is negative for PNA and PE. Will treat for possible COPD exacerbation with albuterol and prednisone. Discussed case with Dr. Almyra Free who agrees with assessment and plan.  Patient handed off to Dr. Betsey Holiday pending delta troponin. If delta troponin normal and patient has no episodes of hypoxia, patient may be discharged home with albuterol and prednisone. Final Clinical Impression(s) / ED Diagnoses Final diagnoses:  Shortness of breath    Rx / DC Orders ED Discharge Orders         Ordered    albuterol (VENTOLIN HFA) 108 (90 Base) MCG/ACT inhaler  Every 6 hours PRN        07/12/20 2259    predniSONE (DELTASONE) 20 MG tablet  Daily        07/12/20 2308           Suzy Bouchard, [place] 07/12/20 2314    Orpah Greek, MD 07/12/20 2356

## 2020-07-12 NOTE — ED Notes (Signed)
pts O2 did not fall below 96% while ambulating, steady gait, pt states SOB, EDP made aware, pt back resting in stretcher comfortably, call light w/in reach

## 2020-07-13 DIAGNOSIS — R0602 Shortness of breath: Secondary | ICD-10-CM | POA: Diagnosis not present

## 2020-07-31 IMAGING — DX CHEST - 2 VIEW
2 series · 2 of 2 positions shown · non-contrast
Comparison: Chest radiograph dated 08/11/2018

CLINICAL DATA: 57-year-old female with chest pain.

EXAM:
CHEST - 2 VIEW

[chest pa]
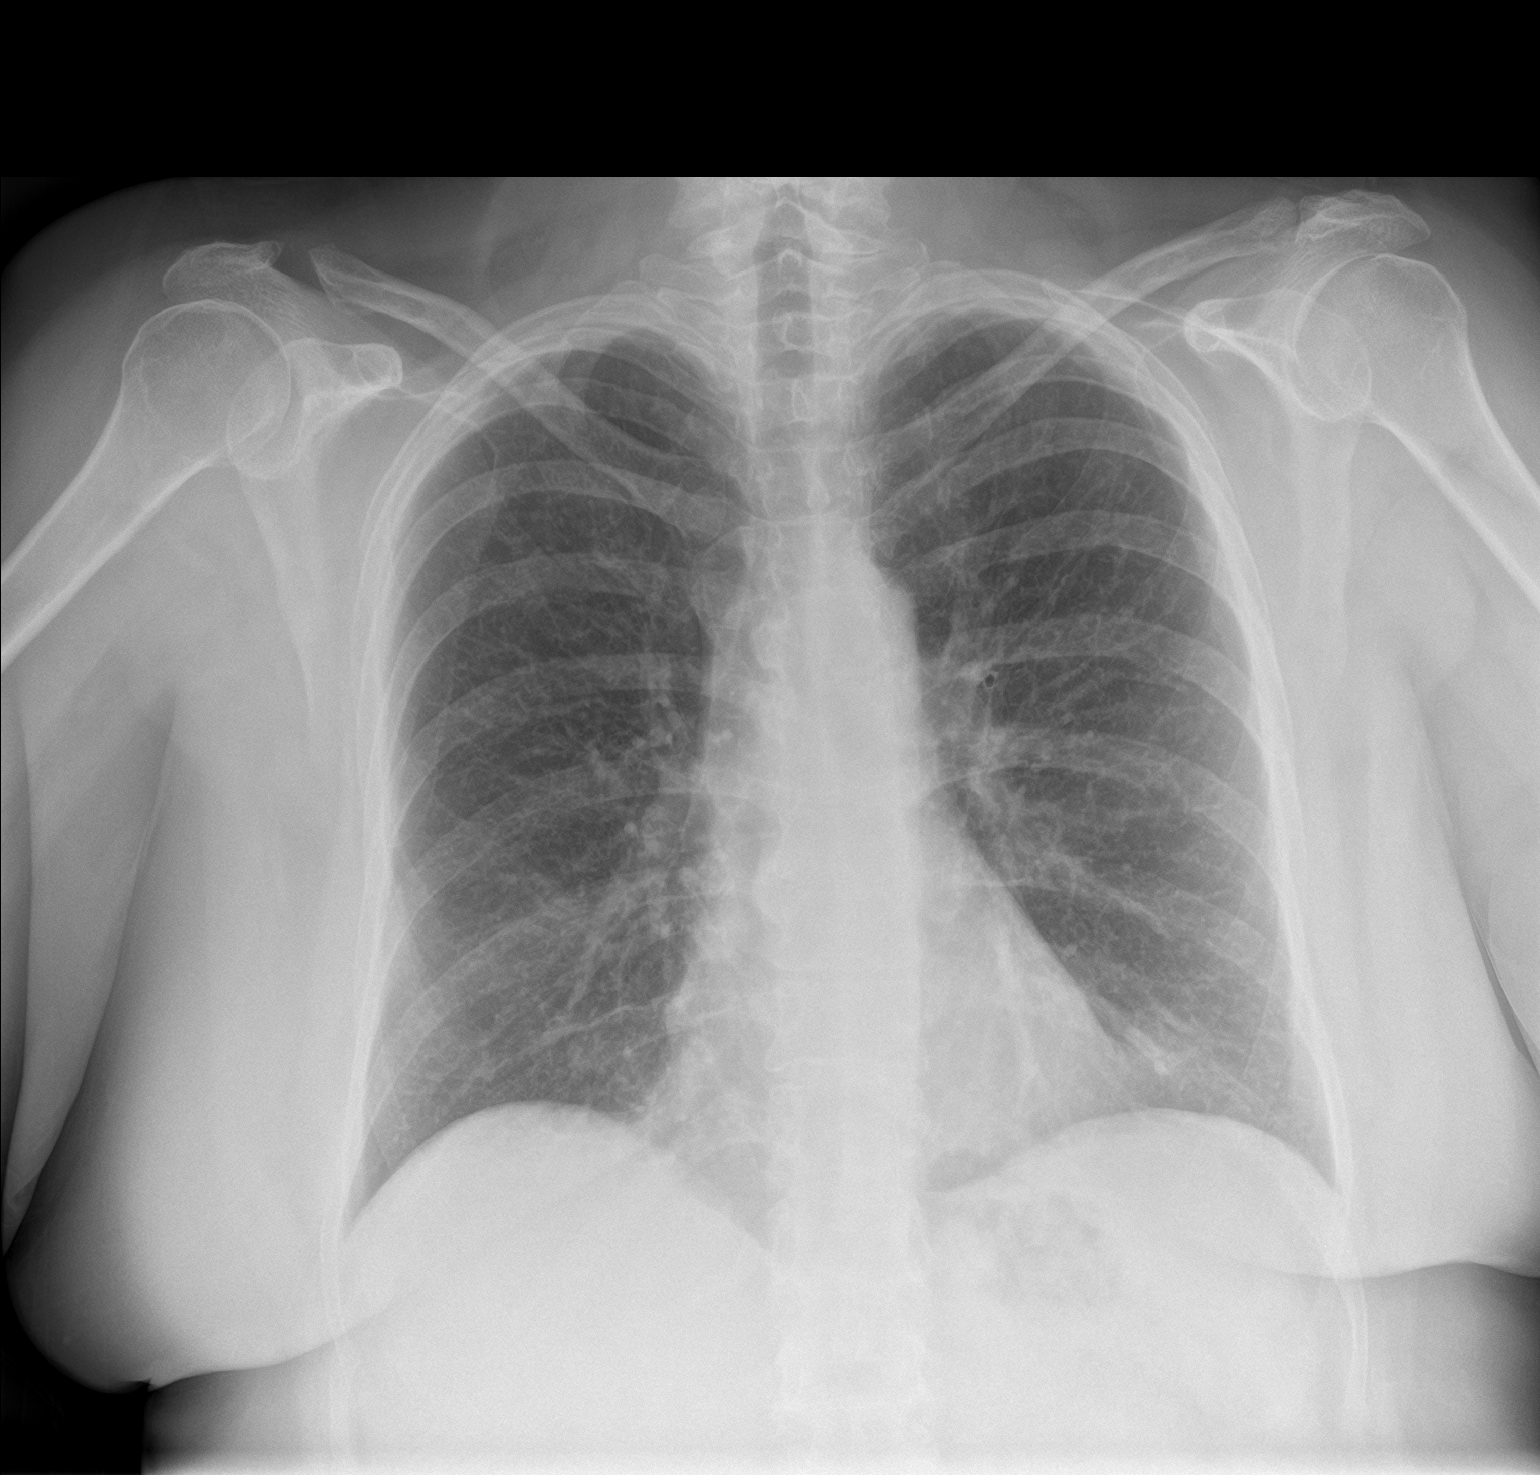

[chest lat]
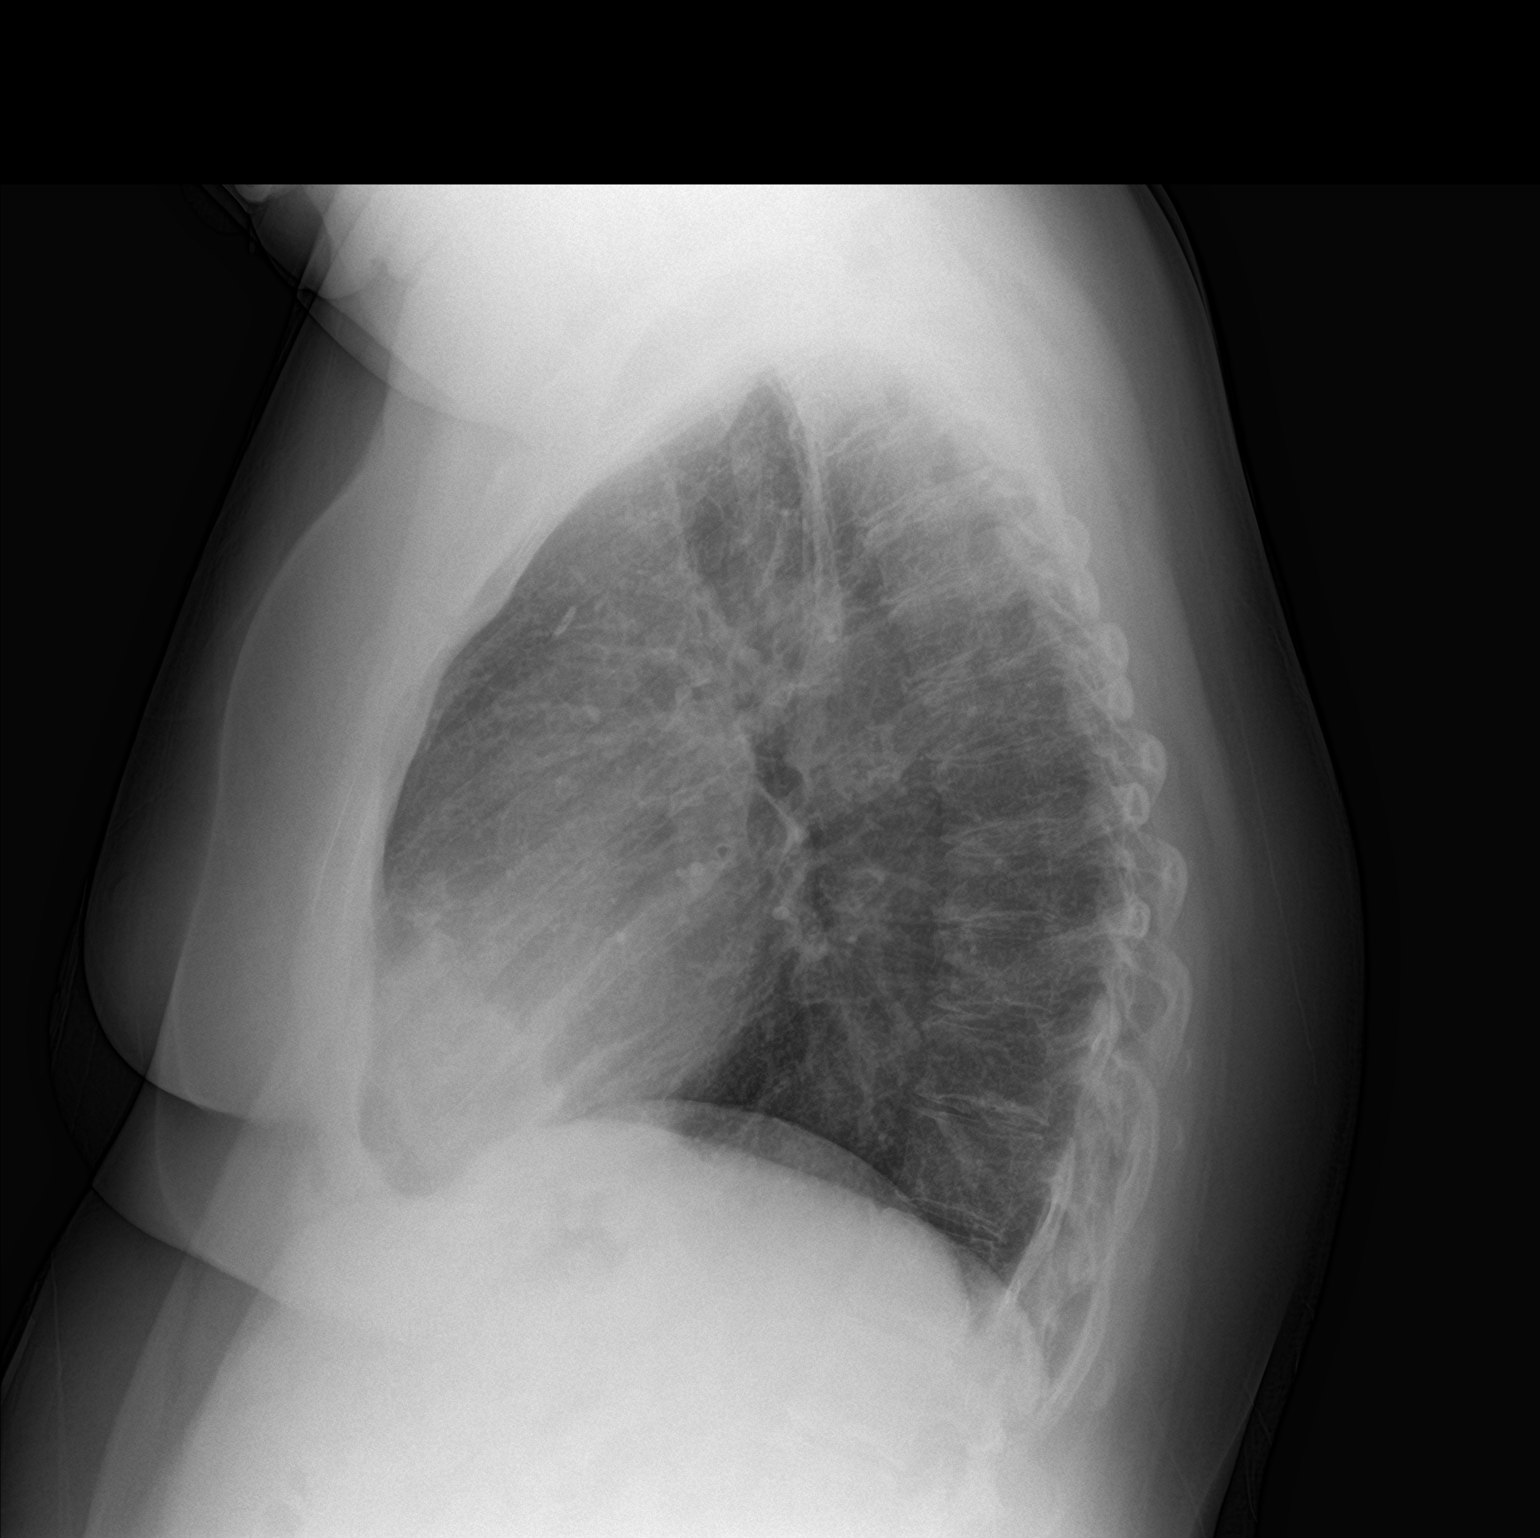

[2 of 2 positions shown; findings below may reference images not displayed]

FINDINGS: The heart size and mediastinal contours are within normal limits.
Both lungs are clear. The visualized skeletal structures are
unremarkable.
IMPRESSION: No active cardiopulmonary disease.

## 2020-08-02 IMAGING — DX LEFT SHOULDER - 2+ VIEW
3 series · 3 of 3 positions shown · non-contrast
Comparison: 11/27/2018

CLINICAL DATA: 57-year-old female with a history of left shoulder
pain

EXAM:
LEFT SHOULDER - 2+ VIEW

[shoulder grashey]
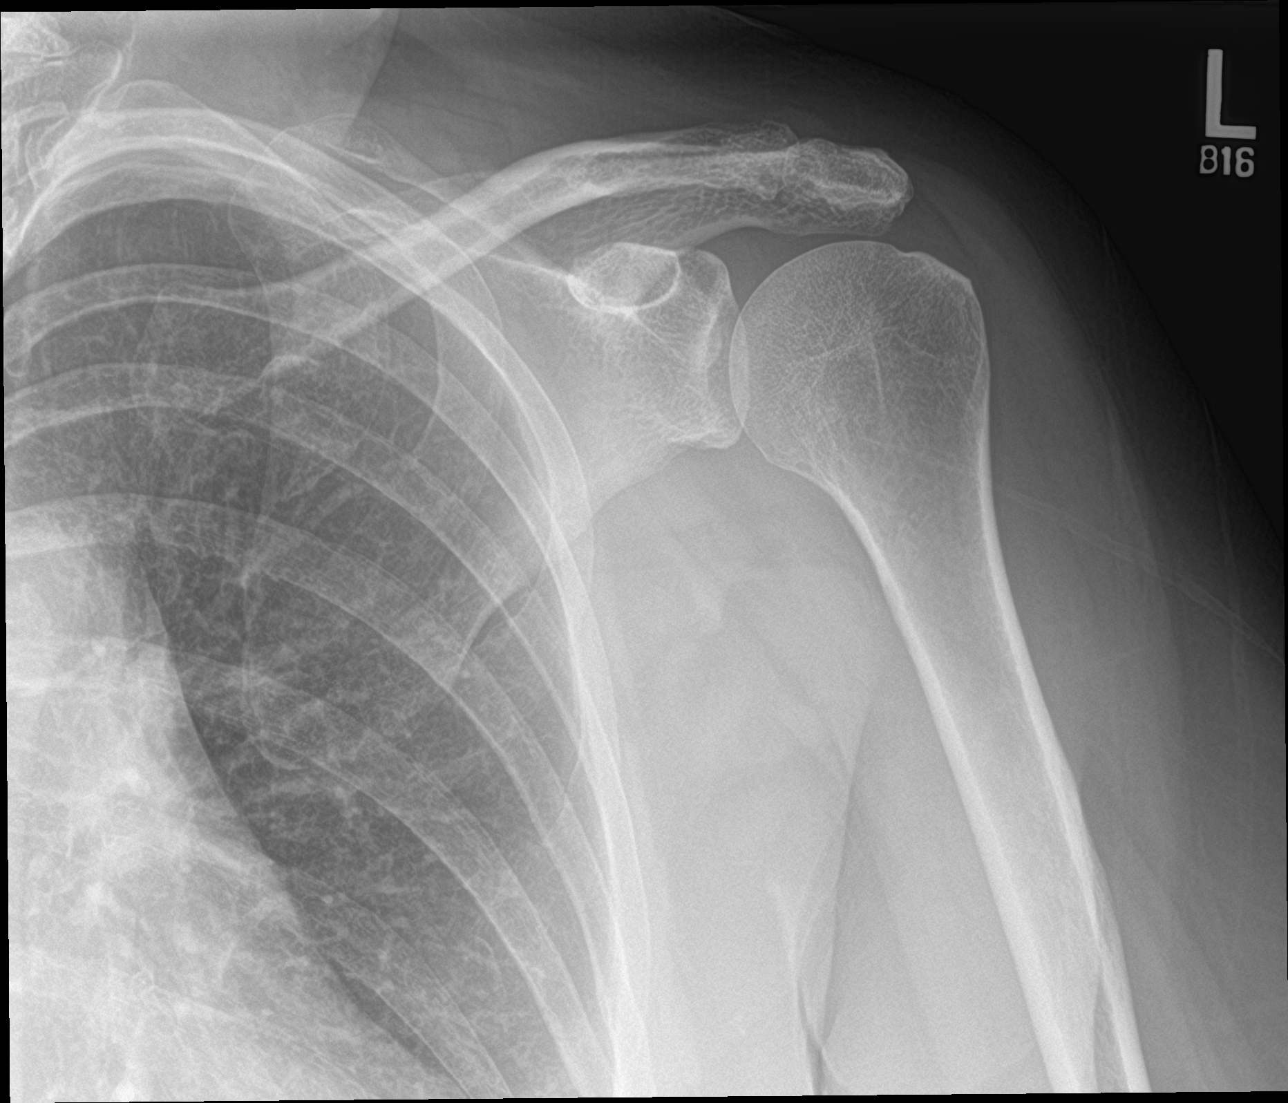

[shoulder y view]
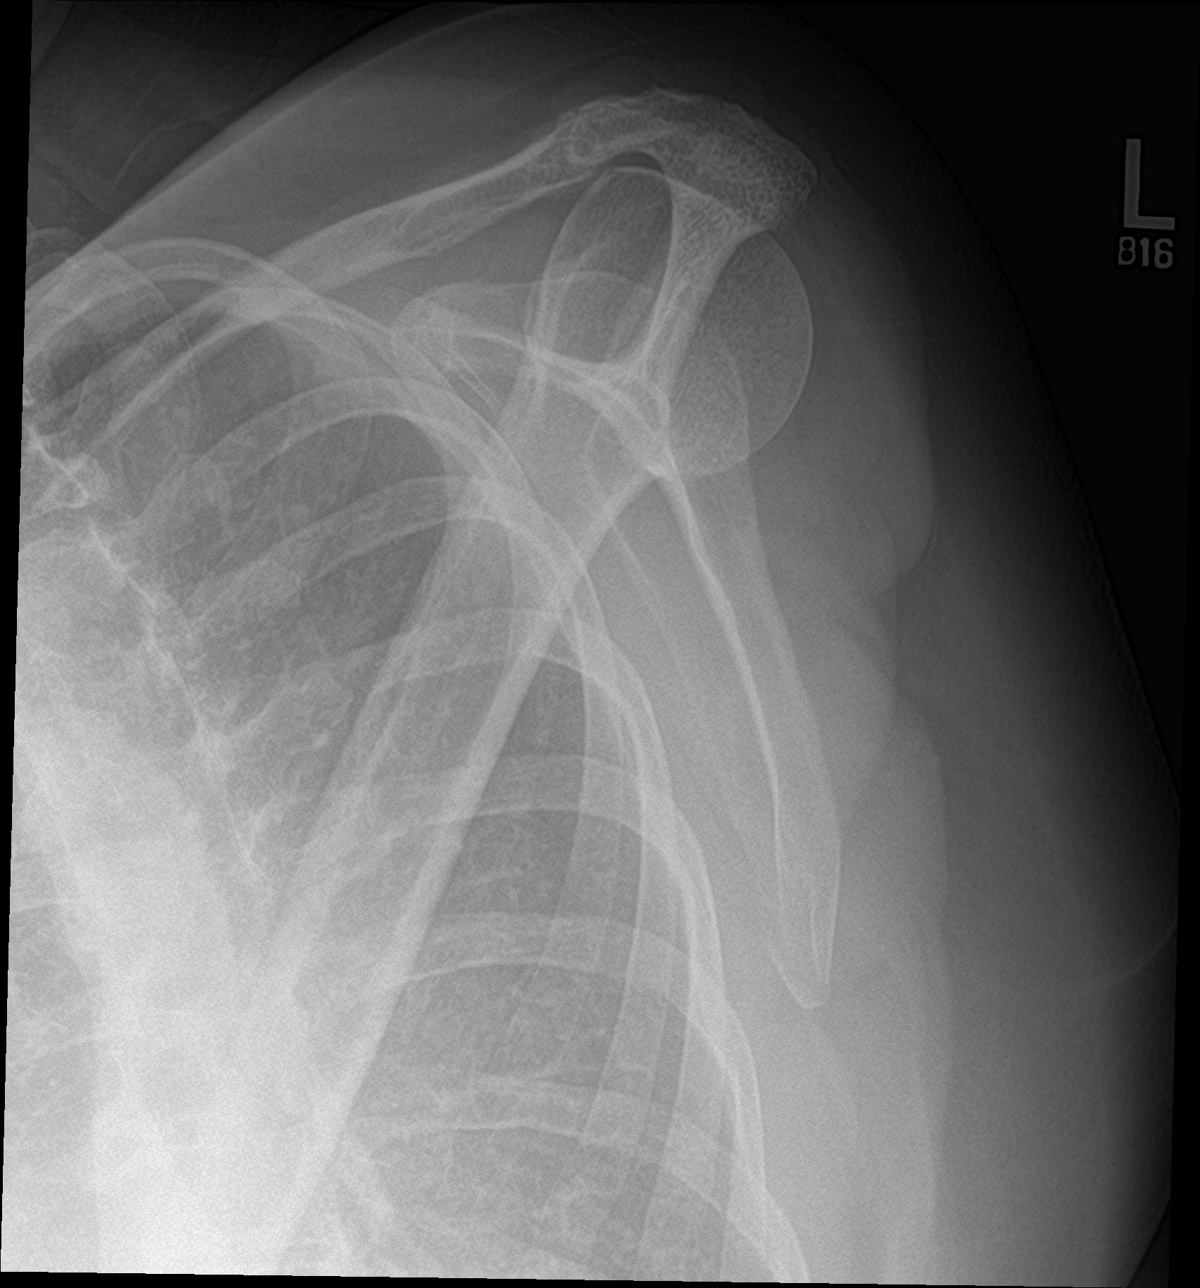

[shoulder axillary]
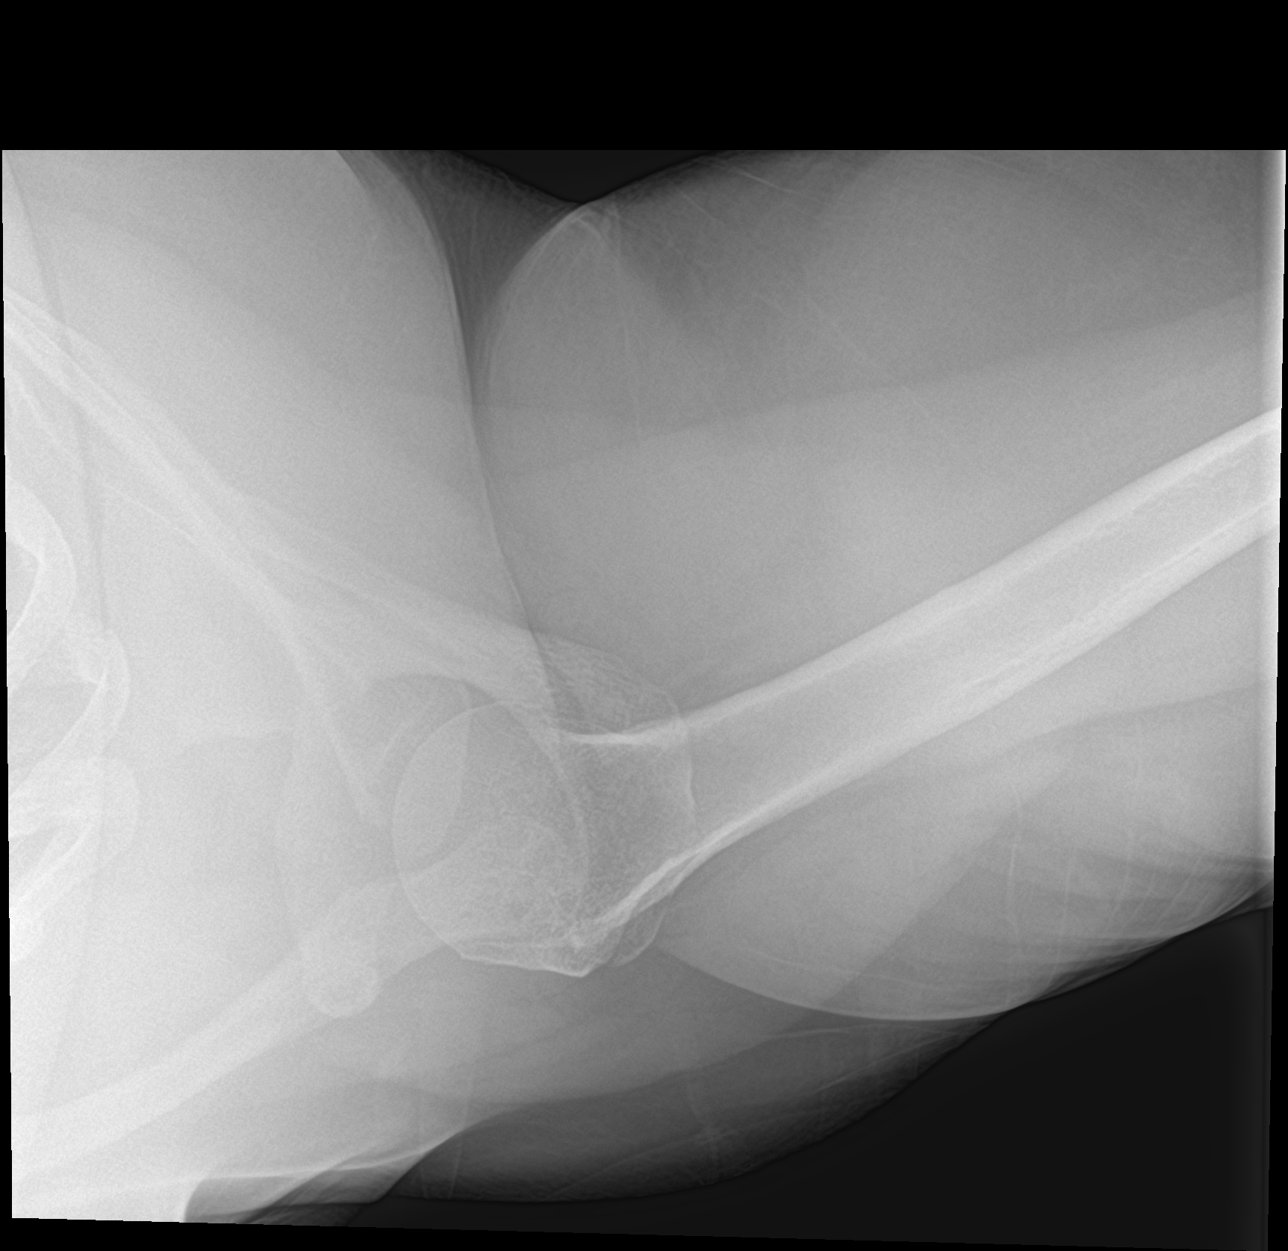

[3 of 3 positions shown; findings below may reference images not displayed]

FINDINGS: Glenohumeral joint congruent. No acute displaced fracture.
Degenerative changes at the acromioclavicular joint. Minimal
subacromial spurring. No focal soft tissue swelling.
IMPRESSION: No acute bony abnormality.

Degenerative changes of the acromioclavicular joint, with minimal
subacromial spurring.

## 2021-01-16 ENCOUNTER — Encounter: Payer: Self-pay | Admitting: Gastroenterology

## 2021-02-12 IMAGING — DX DG CHEST 1V PORT
1 series · 1 of 1 positions shown · non-contrast
Comparison: Chest x-ray 11/27/2018 and chest CT dated 03/22/2018

CLINICAL DATA: Nonproductive cough. Chills. Shortness of breath.
Headache. Chest pain.

EXAM:
PORTABLE CHEST 1 VIEW

[chest ap grid]
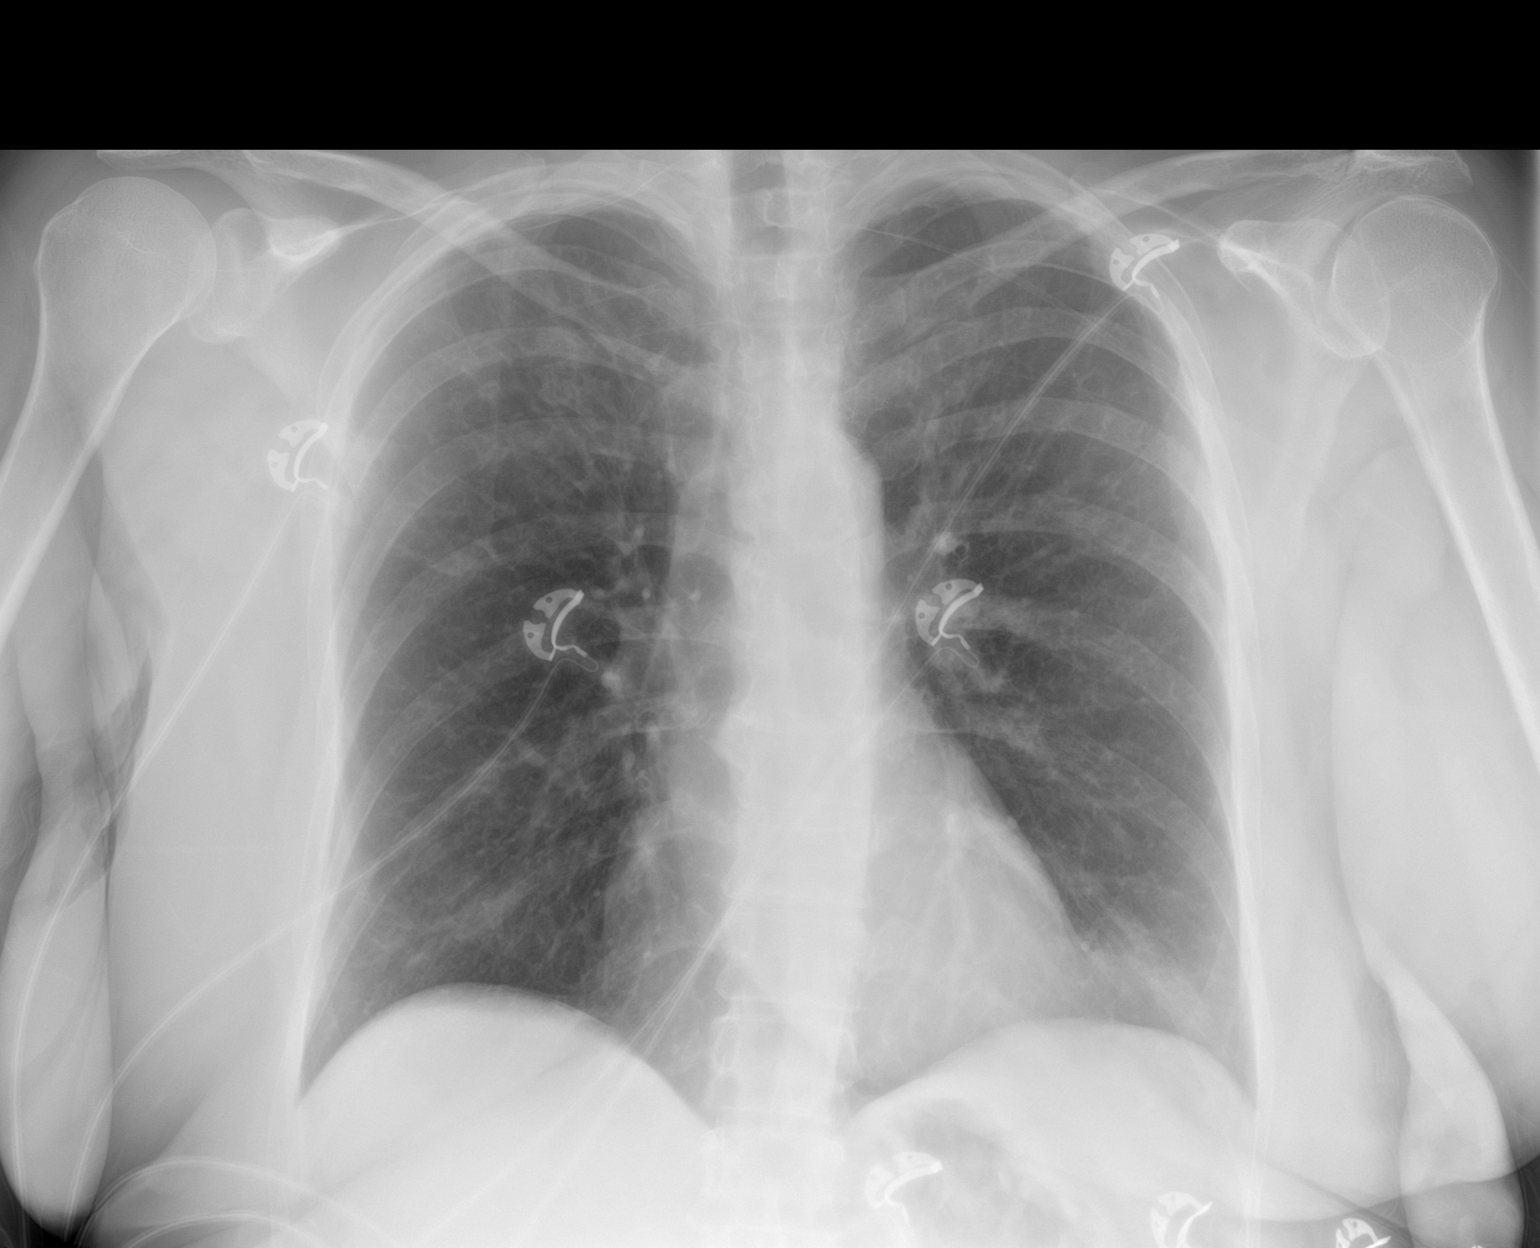

[1 of 1 positions shown; findings below may reference images not displayed]

FINDINGS: The heart size and pulmonary vascularity are normal and the lungs
are clear. Slight prominence of the left pericardial fat pad,
unchanged.

No significant bone abnormality.
IMPRESSION: No active disease.

## 2021-02-12 IMAGING — CT CT HEAD W/O CM
3 series · 15 of 47 positions shown, 18 images · non-contrast
Comparison: Head CT dated 06/19/2018.

CLINICAL DATA: 58-year-old female with TIA.

EXAM:
CT HEAD WITHOUT CONTRAST
TECHNIQUE: Contiguous axial images were obtained from the base of the skull
through the vertex without intravenous contrast.

[Series 2: head w o · axial · 0.38mm/px · z∈[+1699,+1829]mm · 9 of 32 slices shown, 12 images]
[im 3/32  brain]
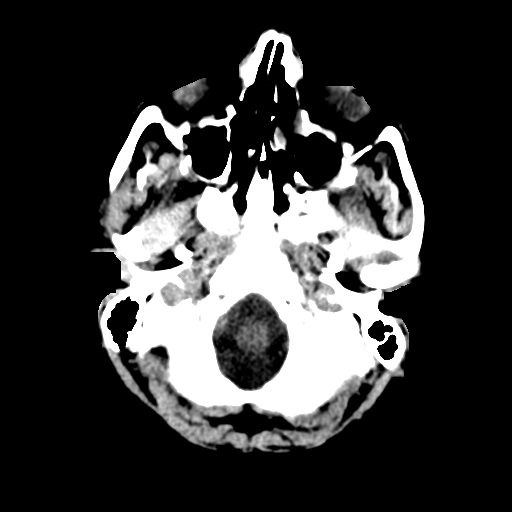
[im 3/32  bone]
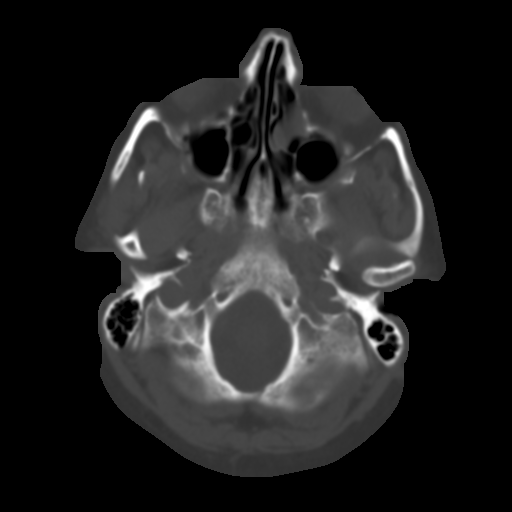
[im 6/32  brain]
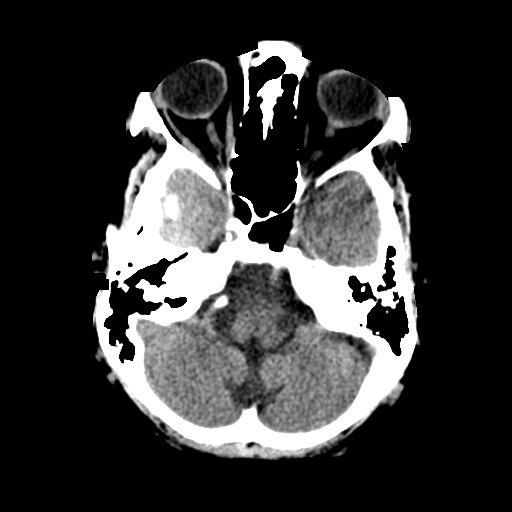
[im 9/32  brain]
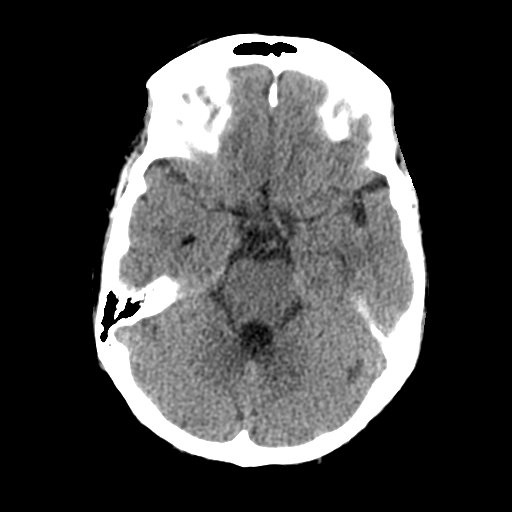
[im 12/32  brain]
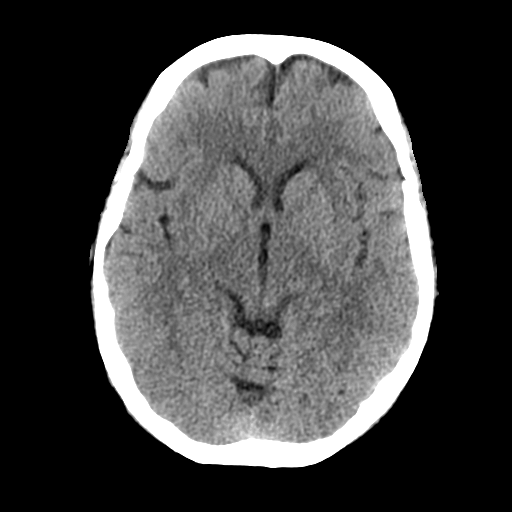
[im 17/32  brain]
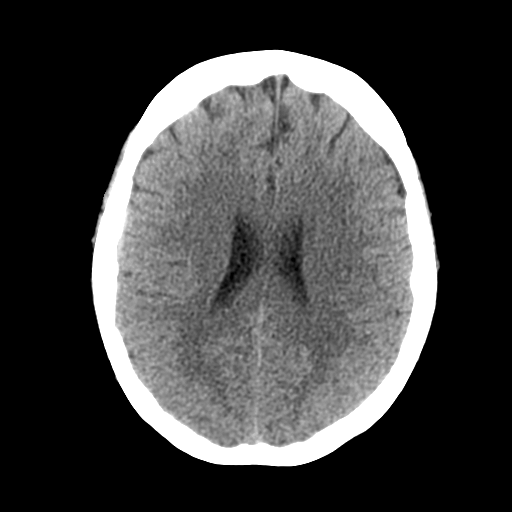
[im 17/32  bone]
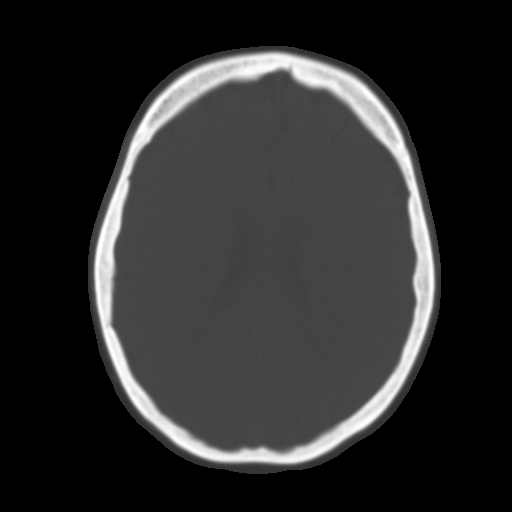
[im 20/32  brain]
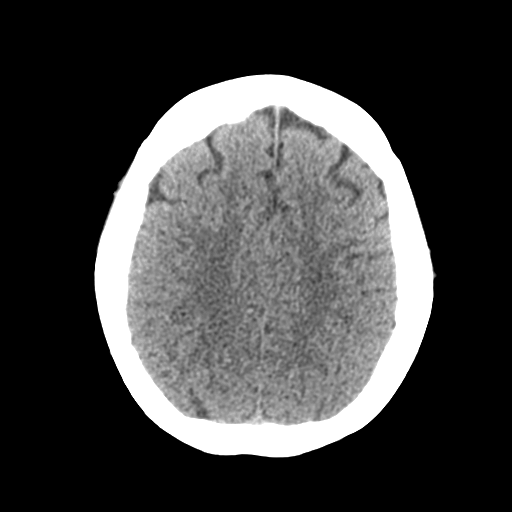
[im 23/32  brain]
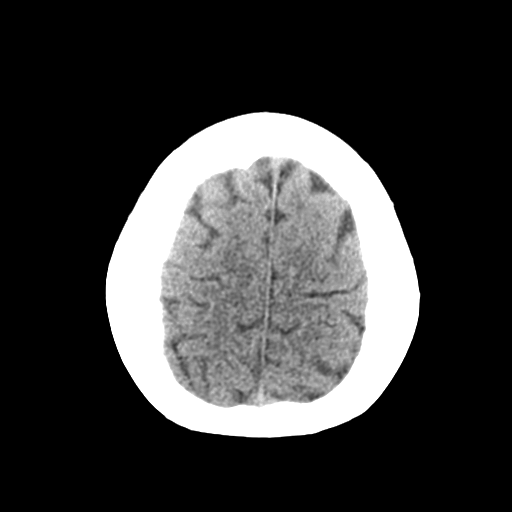
[im 26/32  brain]
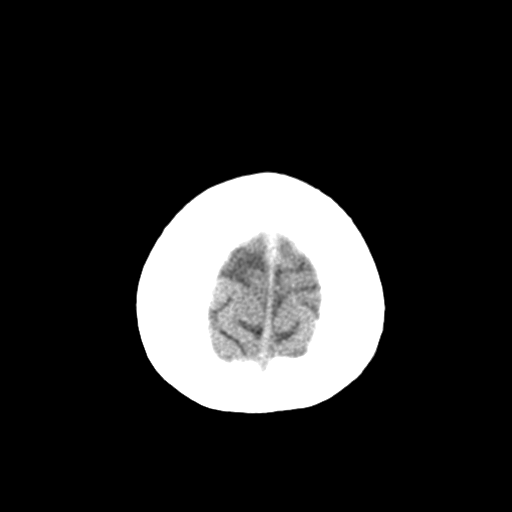
[im 29/32  brain]
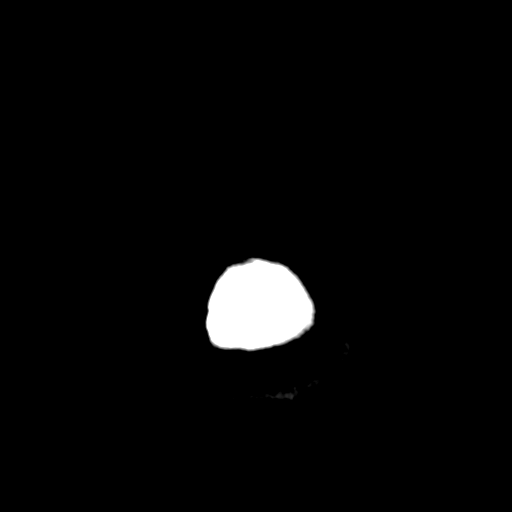
[im 29/32  bone]
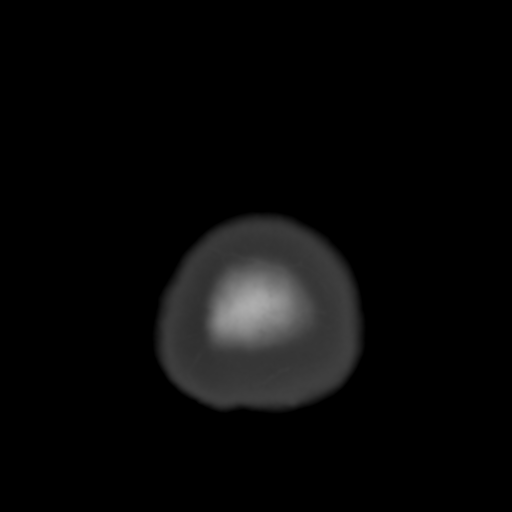

[Series 4: coronal soft · coronal · 0.29mm/px · 3 of 61 slices shown]
[im 21/61  brain]
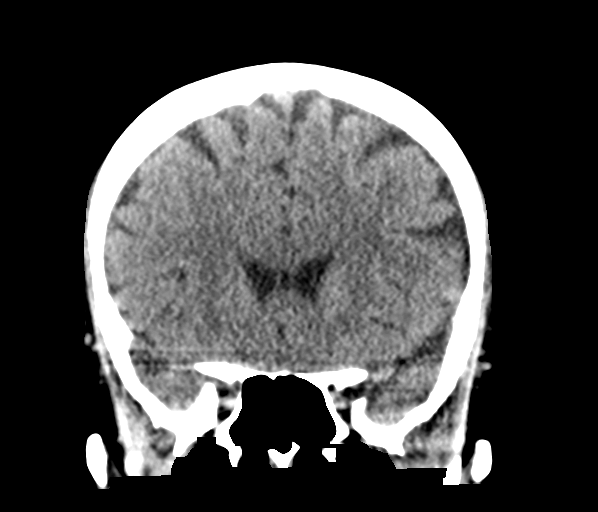
[im 27/61  brain]
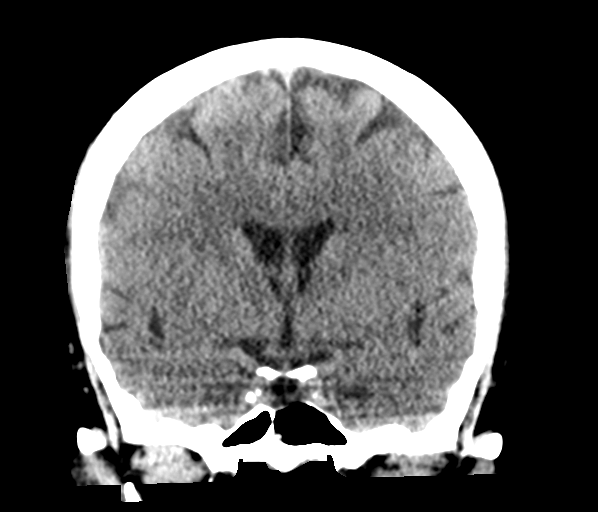
[im 34/61  brain]
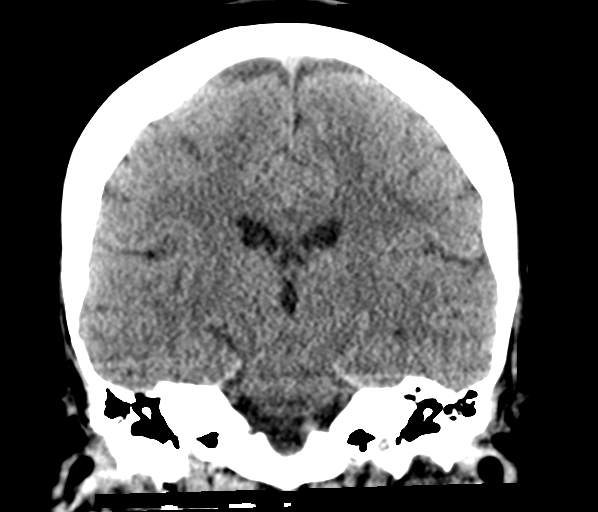

[Series 5: sagittal soft · sagittal · 0.29mm/px · 3 of 58 slices shown]
[im 20/58  brain]
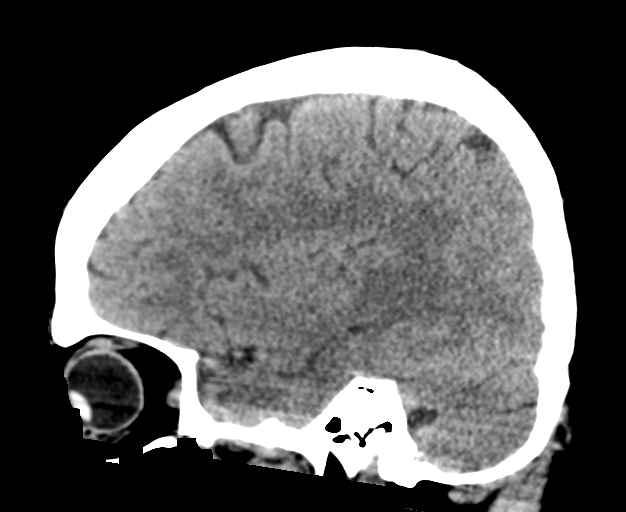
[im 29/58  brain]
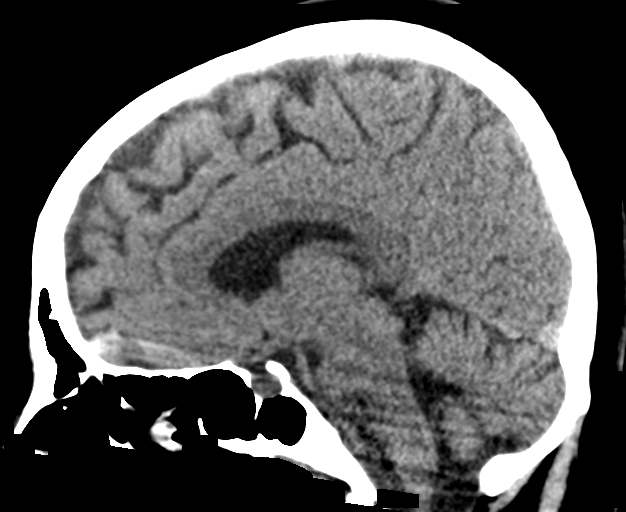
[im 39/58  brain]
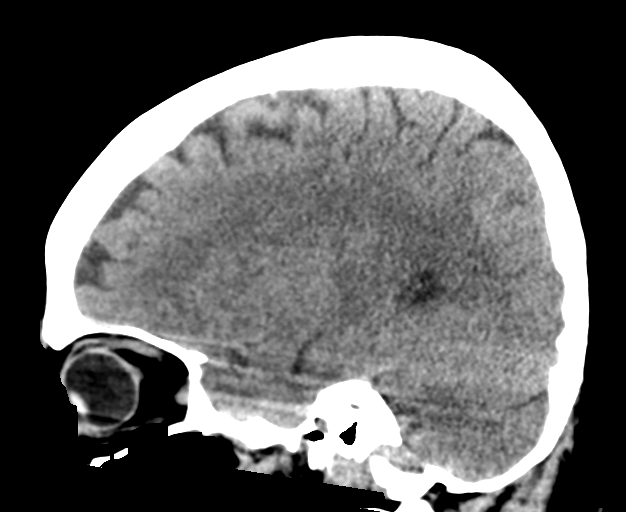

[15 of 47 positions shown; findings below may reference images not displayed]

FINDINGS: Brain: The ventricles and sulci appropriate size for patient's age.
The gray-white matter discrimination is preserved. There is no acute
intracranial hemorrhage. No mass effect or midline shift no
extra-axial fluid collection.

Vascular: No hyperdense vessel or unexpected calcification.

Skull: Normal. Negative for fracture or focal lesion.

Sinuses/Orbits: Opacification of several ethmoid air cells. No
air-fluid level. The remainder of the visualized paranasal sinuses
and mastoid air cells are clear.

Other: None
IMPRESSION: Unremarkable noncontrast CT of the brain.

## 2022-03-16 IMAGING — DX DG CHEST 1V PORT
1 series · 1 of 1 positions shown · non-contrast
Comparison: 02/20/2020

CLINICAL DATA: Shortness of breath

EXAM:
PORTABLE CHEST 1 VIEW

[chest ap]
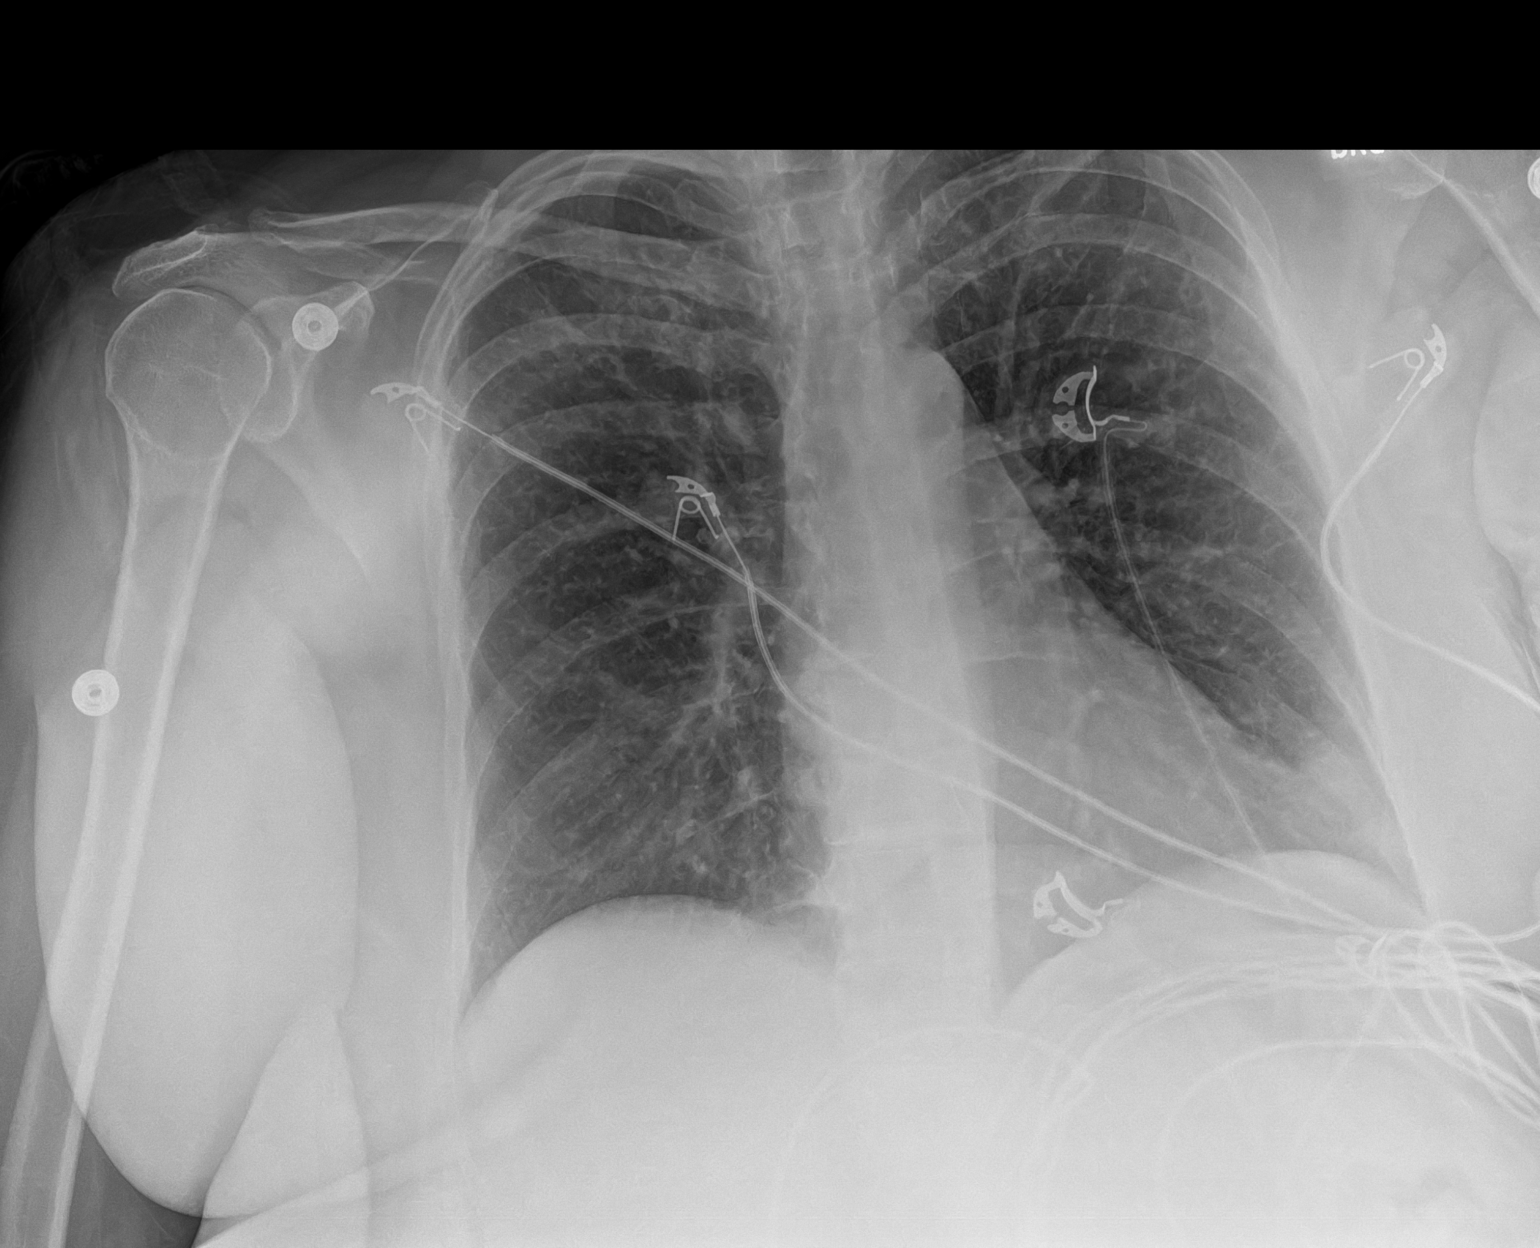

[1 of 1 positions shown; findings below may reference images not displayed]

FINDINGS: Heart is normal size. Lingular scarring. Patchy opacities in the
upper lobes, right greater than left. No effusions. No acute bony
abnormality.
IMPRESSION: Patchy bilateral upper lobe airspace opacities could reflect
pneumonia.

## 2022-03-16 IMAGING — CT CT ANGIO CHEST
2 of 6 series · 19 of 46 positions shown · IV contrast (Omnipaque or Isovue)
Comparison: 03/22/2018

CLINICAL DATA: Shortness of breath

EXAM:
CT ANGIOGRAPHY CHEST WITH CONTRAST
TECHNIQUE: Multidetector CT imaging of the chest was performed using the
standard protocol during bolus administration of intravenous
contrast. Multiplanar CT image reconstructions and MIPs were
obtained to evaluate the vascular anatomy.
CONTRAST:  100mL OMNIPAQUE IOHEXOL 350 MG/ML SOLN

[Series 6: pe axial thins · axial · 0.63mm/px · z∈[+1213,+1476]mm · 16 of 289 slices shown]
[im 13/289  lung]
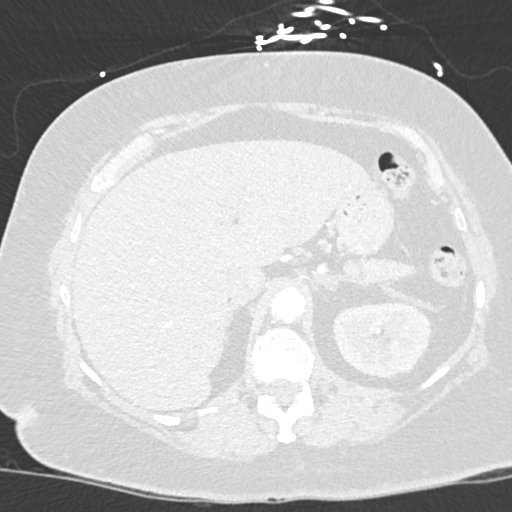
[im 38/289  soft-tissue]
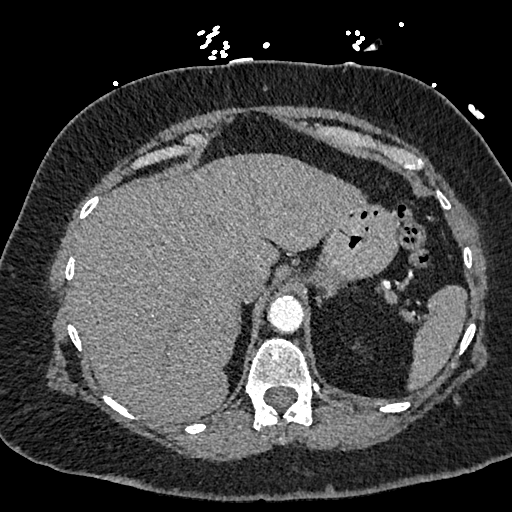
[im 51/289  lung]
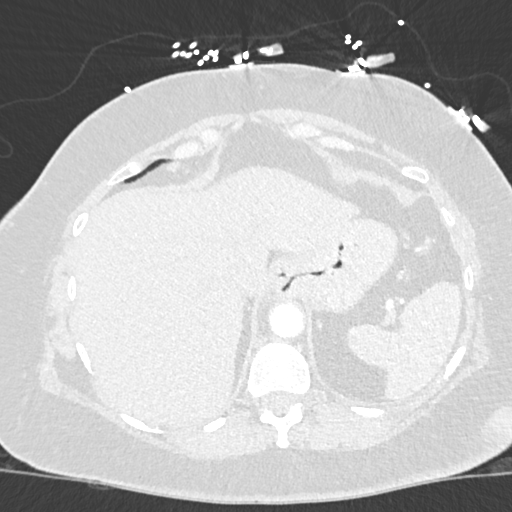
[im 63/289  soft-tissue]
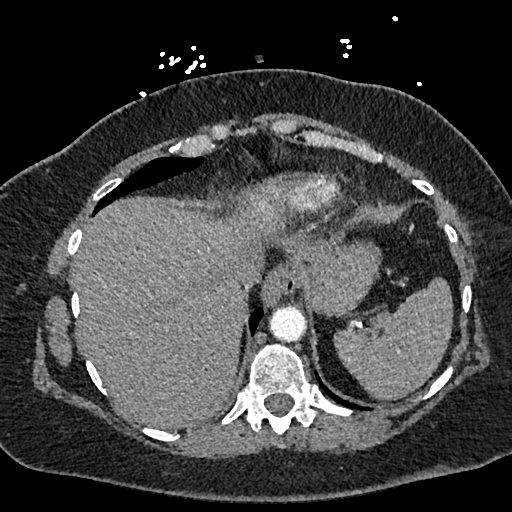
[im 88/289  lung]
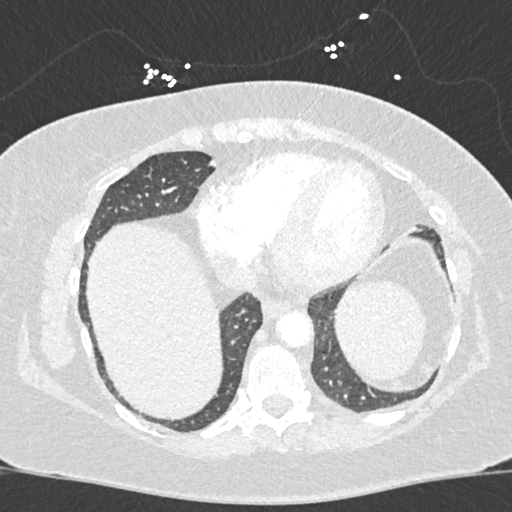
[im 101/289  soft-tissue]
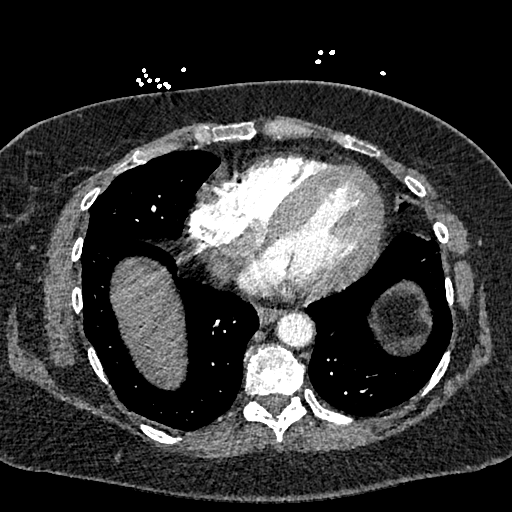
[im 113/289  lung]
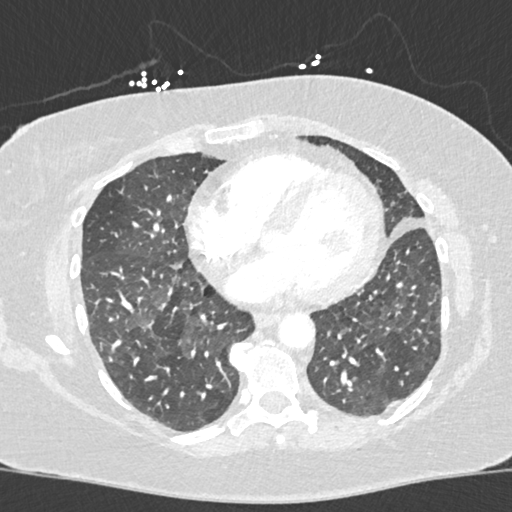
[im 138/289  soft-tissue]
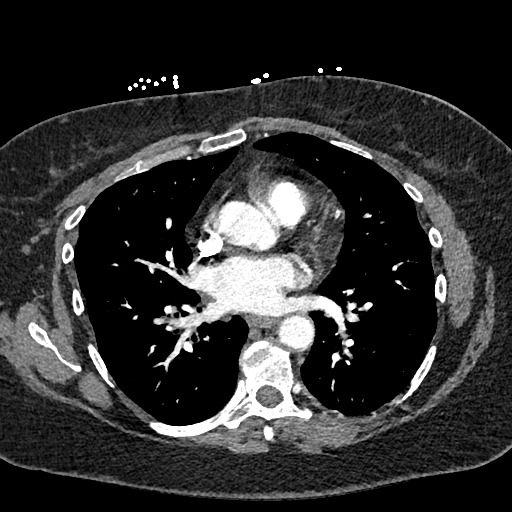
[im 151/289  lung]
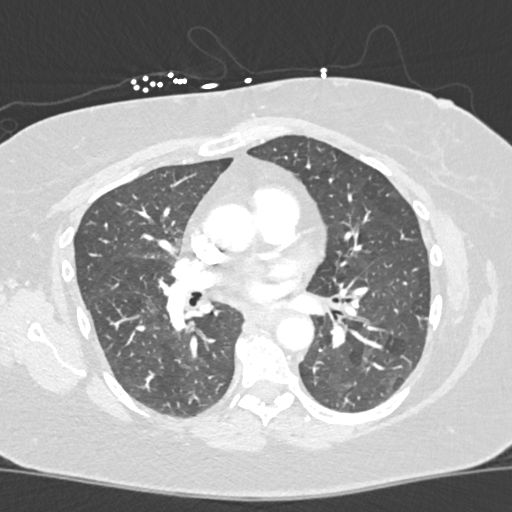
[im 176/289  soft-tissue]
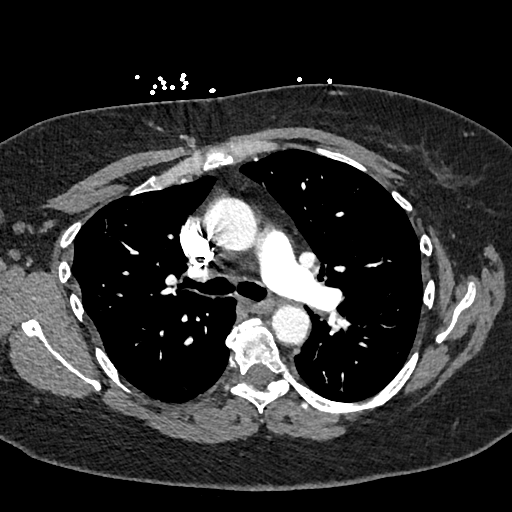
[im 188/289  lung]
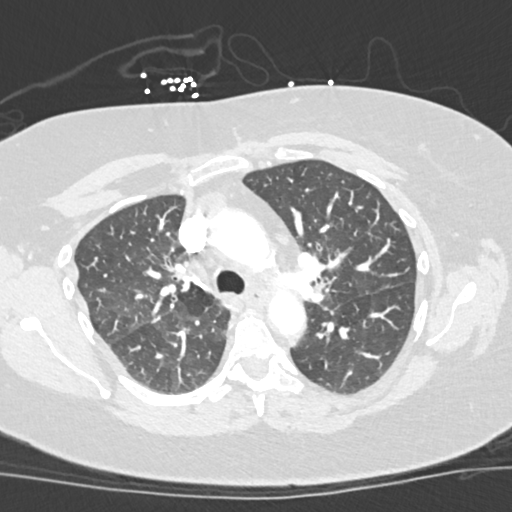
[im 201/289  soft-tissue]
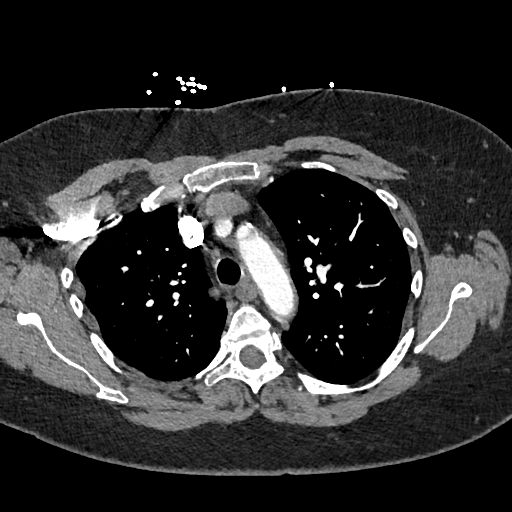
[im 226/289  lung]
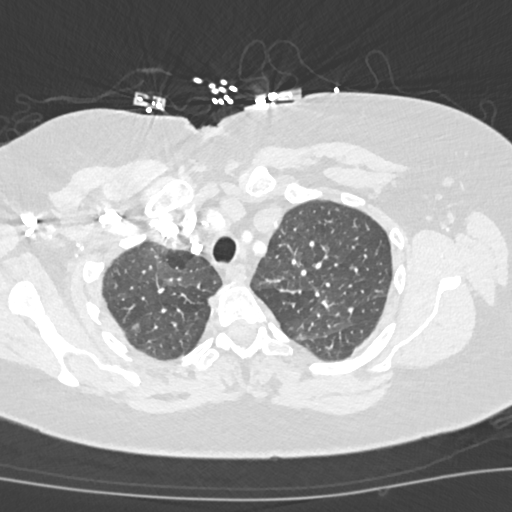
[im 238/289  soft-tissue]
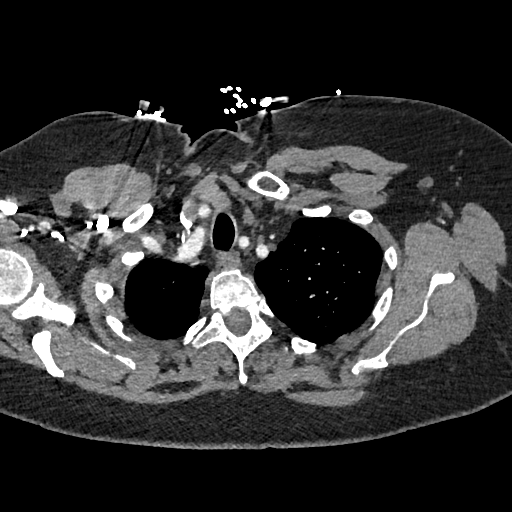
[im 251/289  lung]
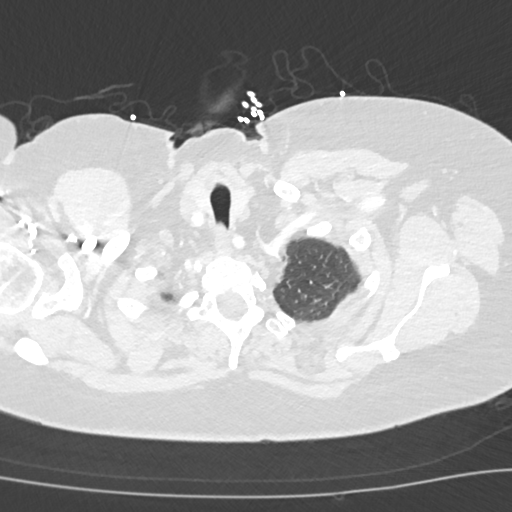
[im 276/289  soft-tissue]
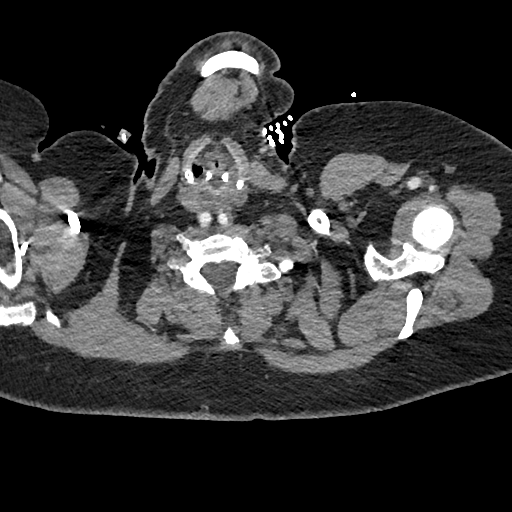

[Series 8: cor soft · coronal · 0.63mm/px · 3 of 149 slices shown]
[im 38/149  soft-tissue]
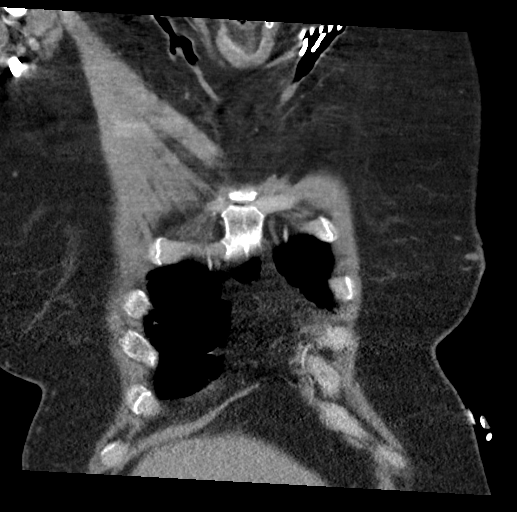
[im 75/149  soft-tissue]
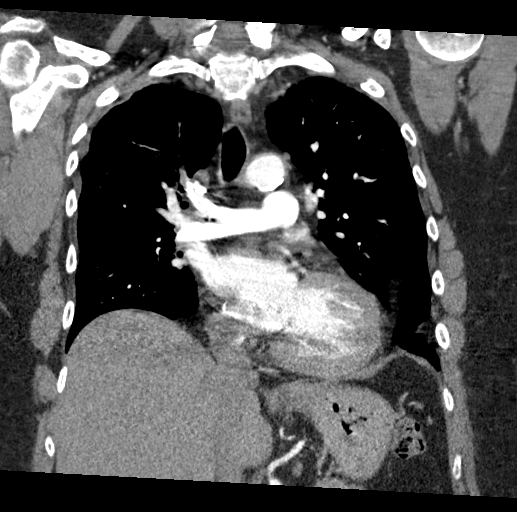
[im 112/149  soft-tissue]
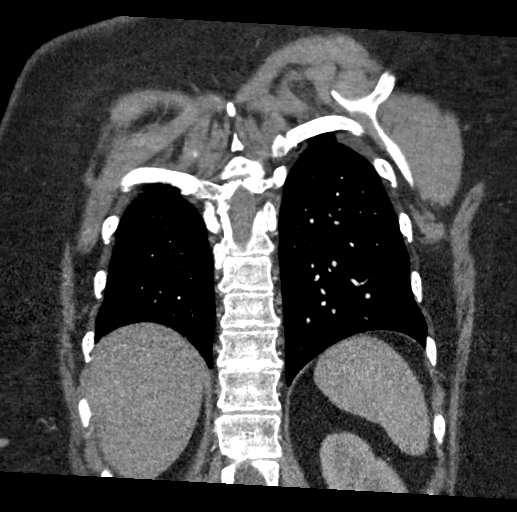

[19 of 46 positions shown; findings below may reference images not displayed]

FINDINGS: Cardiovascular: No filling defects in the pulmonary arteries to
suggest pulmonary emboli. Heart is normal size. Aorta is normal
caliber. Scattered aortic atherosclerosis.

Mediastinum/Nodes: No mediastinal, hilar, or axillary adenopathy.
Trachea and esophagus are unremarkable. Thyroid unremarkable.

Lungs/Pleura: No confluent opacities or effusions.

Upper Abdomen: Imaging into the upper abdomen demonstrates no acute
findings.

Musculoskeletal: Chest wall soft tissues are unremarkable. No acute
bony abnormality.

Review of the MIP images confirms the above findings.
IMPRESSION: No evidence of pulmonary embolus.

No acute cardiopulmonary disease.

Aortic Atherosclerosis ([id number]).
